# Patient Record
Sex: Male | Born: 1950
Health system: Southern US, Community
[De-identification: ages and names within clinical notes are randomized; demographics above are authoritative.]

## PROBLEM LIST (undated history)

## (undated) DIAGNOSIS — E559 Vitamin D deficiency, unspecified: Secondary | ICD-10-CM

## (undated) DIAGNOSIS — G629 Polyneuropathy, unspecified: Secondary | ICD-10-CM

## (undated) DIAGNOSIS — N529 Male erectile dysfunction, unspecified: Secondary | ICD-10-CM

## (undated) DIAGNOSIS — I350 Nonrheumatic aortic (valve) stenosis: Secondary | ICD-10-CM

## (undated) DIAGNOSIS — R011 Cardiac murmur, unspecified: Secondary | ICD-10-CM

## (undated) DIAGNOSIS — E119 Type 2 diabetes mellitus without complications: Secondary | ICD-10-CM

## (undated) DIAGNOSIS — I10 Essential (primary) hypertension: Secondary | ICD-10-CM

## (undated) DIAGNOSIS — I509 Heart failure, unspecified: Secondary | ICD-10-CM

## (undated) DIAGNOSIS — I6529 Occlusion and stenosis of unspecified carotid artery: Secondary | ICD-10-CM

## (undated) DIAGNOSIS — I739 Peripheral vascular disease, unspecified: Secondary | ICD-10-CM

## (undated) DIAGNOSIS — K219 Gastro-esophageal reflux disease without esophagitis: Secondary | ICD-10-CM

## (undated) DIAGNOSIS — E785 Hyperlipidemia, unspecified: Secondary | ICD-10-CM

## (undated) DIAGNOSIS — D472 Monoclonal gammopathy: Secondary | ICD-10-CM

## (undated) DIAGNOSIS — K859 Acute pancreatitis without necrosis or infection, unspecified: Secondary | ICD-10-CM

## (undated) DIAGNOSIS — E7800 Pure hypercholesterolemia, unspecified: Secondary | ICD-10-CM

## (undated) DIAGNOSIS — E871 Hypo-osmolality and hyponatremia: Secondary | ICD-10-CM

## (undated) HISTORY — PX: KNEE SURGERY: SHX244

## (undated) HISTORY — DX: Hypo-osmolality and hyponatremia: E87.1

## (undated) HISTORY — DX: Occlusion and stenosis of unspecified carotid artery: I65.29

## (undated) HISTORY — DX: Male erectile dysfunction, unspecified: N52.9

## (undated) HISTORY — DX: Cardiac murmur, unspecified: R01.1

## (undated) HISTORY — DX: Acute pancreatitis without necrosis or infection, unspecified: K85.90

## (undated) HISTORY — DX: Polyneuropathy, unspecified: G62.9

## (undated) HISTORY — DX: Hyperlipidemia, unspecified: E78.5

## (undated) HISTORY — PX: VASECTOMY: SHX75

## (undated) HISTORY — PX: COLONOSCOPY: SHX174

## (undated) HISTORY — DX: Vitamin D deficiency, unspecified: E55.9

## (undated) HISTORY — PX: CARDIAC CATHETERIZATION: SHX172

---

## 2004-01-22 ENCOUNTER — Emergency Department (HOSPITAL_COMMUNITY): Admission: EM | Admit: 2004-01-22 | Discharge: 2004-01-22 | Payer: Self-pay | Admitting: Emergency Medicine

## 2004-01-27 ENCOUNTER — Emergency Department (HOSPITAL_COMMUNITY): Admission: EM | Admit: 2004-01-27 | Discharge: 2004-01-27 | Payer: Self-pay | Admitting: Emergency Medicine

## 2004-02-04 ENCOUNTER — Emergency Department (HOSPITAL_COMMUNITY): Admission: EM | Admit: 2004-02-04 | Discharge: 2004-02-04 | Payer: Self-pay | Admitting: Emergency Medicine

## 2005-01-04 ENCOUNTER — Ambulatory Visit: Payer: Self-pay | Admitting: Psychiatry

## 2005-01-04 ENCOUNTER — Other Ambulatory Visit (HOSPITAL_COMMUNITY): Admission: RE | Admit: 2005-01-04 | Discharge: 2005-04-04 | Payer: Self-pay | Admitting: Psychiatry

## 2005-02-22 ENCOUNTER — Emergency Department (HOSPITAL_COMMUNITY): Admission: EM | Admit: 2005-02-22 | Discharge: 2005-02-22 | Payer: Self-pay | Admitting: Emergency Medicine

## 2005-09-30 ENCOUNTER — Encounter: Admission: RE | Admit: 2005-09-30 | Discharge: 2005-12-29 | Payer: Self-pay | Admitting: Internal Medicine

## 2006-05-05 ENCOUNTER — Emergency Department (HOSPITAL_COMMUNITY): Admission: EM | Admit: 2006-05-05 | Discharge: 2006-05-05 | Payer: Self-pay | Admitting: Emergency Medicine

## 2008-02-21 ENCOUNTER — Emergency Department (HOSPITAL_BASED_OUTPATIENT_CLINIC_OR_DEPARTMENT_OTHER): Admission: EM | Admit: 2008-02-21 | Discharge: 2008-02-21 | Payer: Self-pay | Admitting: Emergency Medicine

## 2008-02-21 ENCOUNTER — Ambulatory Visit: Payer: Self-pay | Admitting: Diagnostic Radiology

## 2009-09-16 ENCOUNTER — Ambulatory Visit: Payer: Self-pay | Admitting: Internal Medicine

## 2009-09-16 ENCOUNTER — Inpatient Hospital Stay (HOSPITAL_COMMUNITY): Admission: EM | Admit: 2009-09-16 | Discharge: 2009-09-19 | Payer: Self-pay | Admitting: Emergency Medicine

## 2009-09-18 ENCOUNTER — Encounter (INDEPENDENT_AMBULATORY_CARE_PROVIDER_SITE_OTHER): Payer: Self-pay | Admitting: Internal Medicine

## 2009-10-21 ENCOUNTER — Encounter: Admission: RE | Admit: 2009-10-21 | Discharge: 2009-12-17 | Payer: Self-pay | Admitting: Internal Medicine

## 2010-06-06 LAB — COMPREHENSIVE METABOLIC PANEL
ALT: 31 U/L (ref 0–53)
AST: 34 U/L (ref 0–37)
Albumin: 2.3 g/dL — ABNORMAL LOW (ref 3.5–5.2)
Alkaline Phosphatase: 145 U/L — ABNORMAL HIGH (ref 39–117)
BUN: 11 mg/dL (ref 6–23)
CO2: 28 mEq/L (ref 19–32)
Calcium: 8.4 mg/dL (ref 8.4–10.5)
Chloride: 100 mEq/L (ref 96–112)
Creatinine, Ser: 0.7 mg/dL (ref 0.4–1.5)
GFR calc Af Amer: >60 mL/min (ref >60)
GFR calc non Af Amer: >60 mL/min (ref >60)
Glucose, Bld: 238 mg/dL — ABNORMAL HIGH (ref 70–99)
Potassium: 3.5 mEq/L (ref 3.5–5.1)
Sodium: 134 mEq/L — ABNORMAL LOW (ref 135–145)
Total Bilirubin: 1 mg/dL (ref 0.3–1.2)
Total Protein: 6.3 g/dL (ref 6.0–8.3)

## 2010-06-06 LAB — CBC
HCT: 35.3 % — ABNORMAL LOW (ref 39.0–52.0)
HCT: 36.3 % — ABNORMAL LOW (ref 39.0–52.0)
Hemoglobin: 12.1 g/dL — ABNORMAL LOW (ref 13.0–17.0)
Hemoglobin: 12.6 g/dL — ABNORMAL LOW (ref 13.0–17.0)
MCH: 28.5 pg (ref 26.0–34.0)
MCH: 28.6 pg (ref 26.0–34.0)
MCHC: 34.3 g/dL (ref 30.0–36.0)
MCHC: 34.7 g/dL (ref 30.0–36.0)
MCV: 82.5 fL (ref 78.0–100.0)
MCV: 83 fL (ref 78.0–100.0)
Platelets: 261 10*3/uL (ref 150–400)
Platelets: 291 10*3/uL (ref 150–400)
RBC: 4.25 MIL/uL (ref 4.22–5.81)
RBC: 4.4 MIL/uL (ref 4.22–5.81)
RDW: 14.7 % (ref 11.5–15.5)
RDW: 15.1 % (ref 11.5–15.5)
WBC: 10.1 10*3/uL (ref 4.0–10.5)
WBC: 7.6 10*3/uL (ref 4.0–10.5)

## 2010-06-06 LAB — BASIC METABOLIC PANEL
BUN: 11 mg/dL (ref 6–23)
CO2: 25 mEq/L (ref 19–32)
Calcium: 8.6 mg/dL (ref 8.4–10.5)
Chloride: 101 mEq/L (ref 96–112)
Creatinine, Ser: 0.68 mg/dL (ref 0.4–1.5)
GFR calc Af Amer: >60 mL/min (ref >60)
GFR calc non Af Amer: >60 mL/min (ref >60)
Glucose, Bld: 177 mg/dL — ABNORMAL HIGH (ref 70–99)
Potassium: 3.7 mEq/L (ref 3.5–5.1)
Sodium: 134 mEq/L — ABNORMAL LOW (ref 135–145)

## 2010-06-06 LAB — GLUCOSE, CAPILLARY
Glucose-Capillary: 143 mg/dL — ABNORMAL HIGH (ref 70–99)
Glucose-Capillary: 162 mg/dL — ABNORMAL HIGH (ref 70–99)
Glucose-Capillary: 175 mg/dL — ABNORMAL HIGH (ref 70–99)
Glucose-Capillary: 177 mg/dL — ABNORMAL HIGH (ref 70–99)
Glucose-Capillary: 203 mg/dL — ABNORMAL HIGH (ref 70–99)
Glucose-Capillary: 223 mg/dL — ABNORMAL HIGH (ref 70–99)
Glucose-Capillary: 232 mg/dL — ABNORMAL HIGH (ref 70–99)
Glucose-Capillary: 275 mg/dL — ABNORMAL HIGH (ref 70–99)

## 2010-06-06 LAB — LIPASE, BLOOD: Lipase: 40 U/L (ref 11–59)

## 2010-06-07 LAB — COMPREHENSIVE METABOLIC PANEL
ALT: 21 U/L (ref 0–53)
ALT: 26 U/L (ref 0–53)
AST: 27 U/L (ref 0–37)
AST: 29 U/L (ref 0–37)
Albumin: 2.9 g/dL — ABNORMAL LOW (ref 3.5–5.2)
Albumin: 3.6 g/dL (ref 3.5–5.2)
Alkaline Phosphatase: 139 U/L — ABNORMAL HIGH (ref 39–117)
Alkaline Phosphatase: 167 U/L — ABNORMAL HIGH (ref 39–117)
BUN: 12 mg/dL (ref 6–23)
BUN: 13 mg/dL (ref 6–23)
CO2: 18 mEq/L — ABNORMAL LOW (ref 19–32)
CO2: 22 mEq/L (ref 19–32)
Calcium: 8.5 mg/dL (ref 8.4–10.5)
Calcium: 8.7 mg/dL (ref 8.4–10.5)
Chloride: 97 mEq/L (ref 96–112)
Chloride: 97 mEq/L (ref 96–112)
Creatinine, Ser: 0.77 mg/dL (ref 0.4–1.5)
Creatinine, Ser: 1.09 mg/dL (ref 0.4–1.5)
GFR calc Af Amer: >60 mL/min (ref >60)
GFR calc Af Amer: >60 mL/min (ref >60)
GFR calc non Af Amer: >60 mL/min (ref >60)
GFR calc non Af Amer: >60 mL/min (ref >60)
Glucose, Bld: 254 mg/dL — ABNORMAL HIGH (ref 70–99)
Glucose, Bld: 331 mg/dL — ABNORMAL HIGH (ref 70–99)
Potassium: 4.8 mEq/L (ref 3.5–5.1)
Potassium: 4.9 mEq/L (ref 3.5–5.1)
Sodium: 128 mEq/L — ABNORMAL LOW (ref 135–145)
Sodium: 132 mEq/L — ABNORMAL LOW (ref 135–145)
Total Bilirubin: 1.3 mg/dL — ABNORMAL HIGH (ref 0.3–1.2)
Total Bilirubin: 1.9 mg/dL — ABNORMAL HIGH (ref 0.3–1.2)
Total Protein: 7.1 g/dL (ref 6.0–8.3)
Total Protein: 8.1 g/dL (ref 6.0–8.3)

## 2010-06-07 LAB — URINALYSIS, ROUTINE W REFLEX MICROSCOPIC
Bilirubin Urine: NEGATIVE
Glucose, UA: >1000 mg/dL — AB
Ketones, ur: >80 mg/dL — AB
Leukocytes, UA: NEGATIVE
Nitrite: NEGATIVE
Protein, ur: 100 mg/dL — AB
Specific Gravity, Urine: 1.04 — ABNORMAL HIGH (ref 1.005–1.030)
Urobilinogen, UA: 0.2 mg/dL (ref 0.0–1.0)
pH: 5 (ref 5.0–8.0)

## 2010-06-07 LAB — CBC
HCT: 38.5 % — ABNORMAL LOW (ref 39.0–52.0)
HCT: 39.9 % (ref 39.0–52.0)
Hemoglobin: 13.3 g/dL (ref 13.0–17.0)
Hemoglobin: 13.8 g/dL (ref 13.0–17.0)
MCH: 28.6 pg (ref 26.0–34.0)
MCH: 28.8 pg (ref 26.0–34.0)
MCHC: 34.5 g/dL (ref 30.0–36.0)
MCHC: 34.6 g/dL (ref 30.0–36.0)
MCV: 82.7 fL (ref 78.0–100.0)
MCV: 83.3 fL (ref 78.0–100.0)
Platelets: 317 10*3/uL (ref 150–400)
Platelets: 341 10*3/uL (ref 150–400)
RBC: 4.61 MIL/uL (ref 4.22–5.81)
RBC: 4.82 MIL/uL (ref 4.22–5.81)
RDW: 14.6 % (ref 11.5–15.5)
RDW: 14.9 % (ref 11.5–15.5)
WBC: 16.1 10*3/uL — ABNORMAL HIGH (ref 4.0–10.5)
WBC: 16.3 10*3/uL — ABNORMAL HIGH (ref 4.0–10.5)

## 2010-06-07 LAB — DIFFERENTIAL
Band Neutrophils: 0 % (ref 0–10)
Basophils Absolute: 0 10*3/uL (ref 0.0–0.1)
Basophils Relative: 0 % (ref 0–1)
Blasts: 0 %
Eosinophils Absolute: 0 10*3/uL (ref 0.0–0.7)
Eosinophils Relative: 0 % (ref 0–5)
Lymphocytes Relative: 22 % (ref 12–46)
Lymphs Abs: 3.6 10*3/uL (ref 0.7–4.0)
Metamyelocytes Relative: 0 %
Monocytes Absolute: 0.5 10*3/uL (ref 0.1–1.0)
Monocytes Relative: 3 % (ref 3–12)
Myelocytes: 0 %
Neutro Abs: 12.2 10*3/uL — ABNORMAL HIGH (ref 1.7–7.7)
Neutrophils Relative %: 75 % (ref 43–77)
Promyelocytes Absolute: 0 %
nRBC: 0 /100 WBC

## 2010-06-07 LAB — RAPID URINE DRUG SCREEN, HOSP PERFORMED
Amphetamines: NOT DETECTED
Barbiturates: NOT DETECTED
Benzodiazepines: NOT DETECTED
Cocaine: NOT DETECTED
Opiates: NOT DETECTED
Tetrahydrocannabinol: NOT DETECTED

## 2010-06-07 LAB — LIPID PANEL
Cholesterol: 480 mg/dL — ABNORMAL HIGH (ref 0–200)
HDL: 73 mg/dL (ref >39)
LDL Cholesterol: UNDETERMINED mg/dL (ref 0–99)
Total CHOL/HDL Ratio: 6.6 RATIO
Triglycerides: 1125 mg/dL — ABNORMAL HIGH (ref <150)
VLDL: UNDETERMINED mg/dL (ref 0–40)

## 2010-06-07 LAB — CARDIAC PANEL(CRET KIN+CKTOT+MB+TROPI)
CK, MB: 1.2 ng/mL (ref 0.3–4.0)
CK, MB: 1.3 ng/mL (ref 0.3–4.0)
Relative Index: 1.1 (ref 0.0–2.5)
Relative Index: INVALID (ref 0.0–2.5)
Total CK: 123 U/L (ref 7–232)
Total CK: 95 U/L (ref 7–232)
Troponin I: 0.06 ng/mL (ref 0.00–0.06)
Troponin I: 0.07 ng/mL — ABNORMAL HIGH (ref 0.00–0.06)

## 2010-06-07 LAB — CULTURE, BLOOD (ROUTINE X 2)
Culture: NO GROWTH
Culture: NO GROWTH

## 2010-06-07 LAB — CK TOTAL AND CKMB (NOT AT ARMC)
CK, MB: 1.3 ng/mL (ref 0.3–4.0)
Relative Index: 1.2 (ref 0.0–2.5)
Total CK: 112 U/L (ref 7–232)

## 2010-06-07 LAB — GLUCOSE, CAPILLARY
Glucose-Capillary: 183 mg/dL — ABNORMAL HIGH (ref 70–99)
Glucose-Capillary: 201 mg/dL — ABNORMAL HIGH (ref 70–99)
Glucose-Capillary: 202 mg/dL — ABNORMAL HIGH (ref 70–99)
Glucose-Capillary: 225 mg/dL — ABNORMAL HIGH (ref 70–99)
Glucose-Capillary: 231 mg/dL — ABNORMAL HIGH (ref 70–99)
Glucose-Capillary: 243 mg/dL — ABNORMAL HIGH (ref 70–99)
Glucose-Capillary: 265 mg/dL — ABNORMAL HIGH (ref 70–99)
Glucose-Capillary: 326 mg/dL — ABNORMAL HIGH (ref 70–99)

## 2010-06-07 LAB — URINE MICROSCOPIC-ADD ON

## 2010-06-07 LAB — TROPONIN I: Troponin I: 0.09 ng/mL — ABNORMAL HIGH (ref 0.00–0.06)

## 2010-06-07 LAB — TSH: TSH: 0.762 u[IU]/mL (ref 0.350–4.500)

## 2010-06-07 LAB — PSA: PSA: 0.29 ng/mL (ref 0.10–4.00)

## 2010-06-07 LAB — ETHANOL: Alcohol, Ethyl (B): <5 mg/dL (ref 0–10)

## 2010-06-07 LAB — HEMOGLOBIN A1C
Hgb A1c MFr Bld: 12.6 % — ABNORMAL HIGH (ref <5.7)
Mean Plasma Glucose: 315 mg/dL — ABNORMAL HIGH (ref <117)

## 2010-06-07 LAB — LIPASE, BLOOD: Lipase: 108 U/L — ABNORMAL HIGH (ref 11–59)

## 2010-12-24 LAB — DIFFERENTIAL
Basophils Absolute: 0.1 10*3/uL (ref 0.0–0.1)
Basophils Relative: 1 % (ref 0–1)
Eosinophils Absolute: 0.1 10*3/uL (ref 0.0–0.7)
Eosinophils Relative: 1 % (ref 0–5)
Lymphocytes Relative: 13 % (ref 12–46)
Lymphs Abs: 1.8 10*3/uL (ref 0.7–4.0)
Monocytes Absolute: 0.7 10*3/uL (ref 0.1–1.0)
Monocytes Relative: 5 % (ref 3–12)
Neutro Abs: 11.3 10*3/uL — ABNORMAL HIGH (ref 1.7–7.7)
Neutrophils Relative %: 80 % — ABNORMAL HIGH (ref 43–77)

## 2010-12-24 LAB — CBC
HCT: 42.3 % (ref 39.0–52.0)
Hemoglobin: 15 g/dL (ref 13.0–17.0)
MCHC: 35.5 g/dL (ref 30.0–36.0)
MCV: 81.5 fL (ref 78.0–100.0)
Platelets: 357 10*3/uL (ref 150–400)
RBC: 5.19 MIL/uL (ref 4.22–5.81)
RDW: 13.4 % (ref 11.5–15.5)
WBC: 14 10*3/uL — ABNORMAL HIGH (ref 4.0–10.5)

## 2010-12-24 LAB — URINE MICROSCOPIC-ADD ON

## 2010-12-24 LAB — URINALYSIS, ROUTINE W REFLEX MICROSCOPIC
Bilirubin Urine: NEGATIVE
Glucose, UA: >1000 mg/dL — AB
Ketones, ur: 40 mg/dL — AB
Leukocytes, UA: NEGATIVE
Nitrite: NEGATIVE
Protein, ur: 100 mg/dL — AB
Specific Gravity, Urine: 1.043 — ABNORMAL HIGH (ref 1.005–1.030)
Urobilinogen, UA: 0.2 mg/dL (ref 0.0–1.0)
pH: 6.5 (ref 5.0–8.0)

## 2010-12-24 LAB — POCT CARDIAC MARKERS
CKMB, poc: 3.2 ng/mL (ref 1.0–8.0)
CKMB, poc: <1 ng/mL — ABNORMAL LOW (ref 1.0–8.0)
Myoglobin, poc: 181 ng/mL (ref 12–200)
Myoglobin, poc: 98.2 ng/mL (ref 12–200)
Troponin i, poc: <0.05 ng/mL (ref 0.00–0.09)
Troponin i, poc: <0.05 ng/mL (ref 0.00–0.09)

## 2010-12-24 LAB — COMPREHENSIVE METABOLIC PANEL
ALT: 22 U/L (ref 0–53)
AST: 29 U/L (ref 0–37)
Albumin: 3.5 g/dL (ref 3.5–5.2)
Alkaline Phosphatase: 272 U/L — ABNORMAL HIGH (ref 39–117)
BUN: 13 mg/dL (ref 6–23)
CO2: 20 mEq/L (ref 19–32)
Calcium: 9.4 mg/dL (ref 8.4–10.5)
Chloride: 96 mEq/L (ref 96–112)
Creatinine, Ser: 0.7 mg/dL (ref 0.4–1.5)
GFR calc Af Amer: >60 mL/min (ref >60)
GFR calc non Af Amer: >60 mL/min (ref >60)
Glucose, Bld: 286 mg/dL — ABNORMAL HIGH (ref 70–99)
Potassium: 4.4 mEq/L (ref 3.5–5.1)
Sodium: 133 mEq/L — ABNORMAL LOW (ref 135–145)
Total Bilirubin: 0.9 mg/dL (ref 0.3–1.2)
Total Protein: 10 g/dL — ABNORMAL HIGH (ref 6.0–8.3)

## 2010-12-24 LAB — LIPASE, BLOOD: Lipase: 1437 U/L — ABNORMAL HIGH (ref 23–300)

## 2012-08-12 ENCOUNTER — Inpatient Hospital Stay (HOSPITAL_COMMUNITY)
Admission: EM | Admit: 2012-08-12 | Discharge: 2012-08-16 | DRG: 438 | Disposition: A | Discharge status: Home / Self Care | Payer: MEDICAID | Attending: Family Medicine | Admitting: Family Medicine

## 2012-08-12 ENCOUNTER — Encounter (HOSPITAL_COMMUNITY): Payer: Self-pay | Admitting: *Deleted

## 2012-08-12 ENCOUNTER — Inpatient Hospital Stay (HOSPITAL_COMMUNITY): Payer: Self-pay

## 2012-08-12 ENCOUNTER — Emergency Department (HOSPITAL_COMMUNITY): Payer: Self-pay

## 2012-08-12 DIAGNOSIS — E669 Obesity, unspecified: Secondary | ICD-10-CM | POA: Diagnosis present

## 2012-08-12 DIAGNOSIS — Z87891 Personal history of nicotine dependence: Secondary | ICD-10-CM

## 2012-08-12 DIAGNOSIS — E785 Hyperlipidemia, unspecified: Secondary | ICD-10-CM

## 2012-08-12 DIAGNOSIS — E119 Type 2 diabetes mellitus without complications: Secondary | ICD-10-CM

## 2012-08-12 DIAGNOSIS — E872 Acidosis, unspecified: Secondary | ICD-10-CM | POA: Diagnosis present

## 2012-08-12 DIAGNOSIS — E1310 Other specified diabetes mellitus with ketoacidosis without coma: Secondary | ICD-10-CM | POA: Diagnosis present

## 2012-08-12 DIAGNOSIS — K859 Acute pancreatitis without necrosis or infection, unspecified: Principal | ICD-10-CM | POA: Diagnosis present

## 2012-08-12 DIAGNOSIS — R748 Abnormal levels of other serum enzymes: Secondary | ICD-10-CM | POA: Diagnosis present

## 2012-08-12 DIAGNOSIS — I10 Essential (primary) hypertension: Secondary | ICD-10-CM | POA: Diagnosis present

## 2012-08-12 DIAGNOSIS — E871 Hypo-osmolality and hyponatremia: Secondary | ICD-10-CM | POA: Diagnosis present

## 2012-08-12 HISTORY — DX: Hyperlipidemia, unspecified: E78.5

## 2012-08-12 HISTORY — DX: Essential (primary) hypertension: I10

## 2012-08-12 HISTORY — DX: Pure hypercholesterolemia, unspecified: E78.00

## 2012-08-12 HISTORY — DX: Type 2 diabetes mellitus without complications: E11.9

## 2012-08-12 HISTORY — DX: Acute pancreatitis without necrosis or infection, unspecified: K85.90

## 2012-08-12 LAB — POCT I-STAT TROPONIN I: Troponin i, poc: 0.01 ng/mL (ref 0.00–0.08)

## 2012-08-12 LAB — CBC WITH DIFFERENTIAL/PLATELET
Basophils Absolute: 0 10*3/uL (ref 0.0–0.1)
Basophils Relative: 0 % (ref 0–1)
Eosinophils Absolute: 0 10*3/uL (ref 0.0–0.7)
Eosinophils Relative: 0 % (ref 0–5)
HCT: 41.5 % (ref 39.0–52.0)
Hemoglobin: 15.3 g/dL (ref 13.0–17.0)
Lymphocytes Relative: 10 % — ABNORMAL LOW (ref 12–46)
Lymphs Abs: 1.3 10*3/uL (ref 0.7–4.0)
MCH: 29.1 pg (ref 26.0–34.0)
MCHC: 36.9 g/dL — ABNORMAL HIGH (ref 30.0–36.0)
MCV: 78.9 fL (ref 78.0–100.0)
Monocytes Absolute: 1.7 10*3/uL — ABNORMAL HIGH (ref 0.1–1.0)
Monocytes Relative: 13 % — ABNORMAL HIGH (ref 3–12)
Neutro Abs: 10.2 10*3/uL — ABNORMAL HIGH (ref 1.7–7.7)
Neutrophils Relative %: 77 % (ref 43–77)
Platelets: 286 10*3/uL (ref 150–400)
RBC: 5.26 MIL/uL (ref 4.22–5.81)
RDW: 13 % (ref 11.5–15.5)
WBC: 13.2 10*3/uL — ABNORMAL HIGH (ref 4.0–10.5)

## 2012-08-12 LAB — COMPREHENSIVE METABOLIC PANEL
ALT: 27 U/L (ref 0–53)
AST: 25 U/L (ref 0–37)
Albumin: 4 g/dL (ref 3.5–5.2)
Alkaline Phosphatase: 200 U/L — ABNORMAL HIGH (ref 39–117)
BUN: 14 mg/dL (ref 6–23)
CO2: 17 mEq/L — ABNORMAL LOW (ref 19–32)
Calcium: 9.4 mg/dL (ref 8.4–10.5)
Chloride: 85 mEq/L — ABNORMAL LOW (ref 96–112)
Creatinine, Ser: 0.87 mg/dL (ref 0.50–1.35)
GFR calc Af Amer: >90 mL/min (ref >90)
GFR calc non Af Amer: >90 mL/min (ref >90)
Glucose, Bld: 351 mg/dL — ABNORMAL HIGH (ref 70–99)
Potassium: 5 mEq/L (ref 3.5–5.1)
Sodium: 124 mEq/L — ABNORMAL LOW (ref 135–145)
Total Bilirubin: 0.5 mg/dL (ref 0.3–1.2)
Total Protein: 9 g/dL — ABNORMAL HIGH (ref 6.0–8.3)

## 2012-08-12 LAB — LIPASE, BLOOD: Lipase: 3120 U/L — ABNORMAL HIGH (ref 11–59)

## 2012-08-12 LAB — URINALYSIS, ROUTINE W REFLEX MICROSCOPIC
Bilirubin Urine: NEGATIVE
Glucose, UA: >1000 mg/dL — AB
Hgb urine dipstick: NEGATIVE
Ketones, ur: 40 mg/dL — AB
Leukocytes, UA: NEGATIVE
Nitrite: NEGATIVE
Protein, ur: 30 mg/dL — AB
Specific Gravity, Urine: 1.036 — ABNORMAL HIGH (ref 1.005–1.030)
Urobilinogen, UA: 0.2 mg/dL (ref 0.0–1.0)
pH: 7.5 (ref 5.0–8.0)

## 2012-08-12 LAB — BLOOD GAS, ARTERIAL
Acid-base deficit: 5.9 mmol/L — ABNORMAL HIGH (ref 0.0–2.0)
Bicarbonate: 18.3 mEq/L — ABNORMAL LOW (ref 20.0–24.0)
Drawn by: 31288
FIO2: 0.21 %
O2 Saturation: 91.6 %
Patient temperature: 98.6
TCO2: 16.4 mmol/L (ref 0–100)
pCO2 arterial: 33.9 mmHg — ABNORMAL LOW (ref 35.0–45.0)
pH, Arterial: 7.352 (ref 7.350–7.450)
pO2, Arterial: 68 mmHg — ABNORMAL LOW (ref 80.0–100.0)

## 2012-08-12 LAB — URINE MICROSCOPIC-ADD ON: Urine-Other: NONE SEEN

## 2012-08-12 LAB — GLUCOSE, CAPILLARY
Glucose-Capillary: 248 mg/dL — ABNORMAL HIGH (ref 70–99)
Glucose-Capillary: 285 mg/dL — ABNORMAL HIGH (ref 70–99)

## 2012-08-12 MED ORDER — ACETAMINOPHEN 650 MG RE SUPP: 650.0000 mg | Freq: Four times a day (QID) | RECTAL | Status: DC | PRN

## 2012-08-12 MED ORDER — IOHEXOL 300 MG/ML  SOLN
50.0000 mL | Freq: Once | INTRAMUSCULAR | Status: AC | PRN
  Administered 2012-08-12: 50 mL via ORAL

## 2012-08-12 MED ORDER — SODIUM CHLORIDE 0.9 % IV BOLUS (SEPSIS): 1000.0000 mL | Freq: Once | INTRAVENOUS | Status: DC

## 2012-08-12 MED ORDER — SODIUM CHLORIDE 0.9 % IV BOLUS (SEPSIS)
1000.0000 mL | Freq: Once | INTRAVENOUS | Status: AC
  Administered 2012-08-12: 1000 mL via INTRAVENOUS

## 2012-08-12 MED ORDER — HYDROMORPHONE HCL PF 1 MG/ML IJ SOLN
1.0000 mg | INTRAMUSCULAR | Status: DC | PRN
  Administered 2012-08-13 – 2012-08-16 (×14): 1 mg via INTRAVENOUS
  Filled 2012-08-12 (×14): qty 1

## 2012-08-12 MED ORDER — INSULIN ASPART 100 UNIT/ML ~~LOC~~ SOLN: 0.0000 [IU] | Freq: Three times a day (TID) | SUBCUTANEOUS | Status: DC

## 2012-08-12 MED ORDER — MORPHINE SULFATE 4 MG/ML IJ SOLN
4.0000 mg | Freq: Once | INTRAMUSCULAR | Status: AC
  Administered 2012-08-12: 4 mg via INTRAVENOUS
  Filled 2012-08-12: qty 1

## 2012-08-12 MED ORDER — ONDANSETRON HCL 4 MG PO TABS: 4.0000 mg | ORAL_TABLET | Freq: Four times a day (QID) | ORAL | Status: DC | PRN

## 2012-08-12 MED ORDER — ONDANSETRON HCL 4 MG/2ML IJ SOLN
4.0000 mg | Freq: Four times a day (QID) | INTRAMUSCULAR | Status: DC | PRN
  Administered 2012-08-14: 4 mg via INTRAVENOUS
  Filled 2012-08-12: qty 2

## 2012-08-12 MED ORDER — SODIUM CHLORIDE 0.9 % IJ SOLN
3.0000 mL | Freq: Two times a day (BID) | INTRAMUSCULAR | Status: DC
  Administered 2012-08-13 – 2012-08-14 (×2): 3 mL via INTRAVENOUS

## 2012-08-12 MED ORDER — LABETALOL HCL 5 MG/ML IV SOLN
5.0000 mg | INTRAVENOUS | Status: DC | PRN
  Administered 2012-08-16: 5 mg via INTRAVENOUS
  Filled 2012-08-12: qty 4

## 2012-08-12 MED ORDER — ACETAMINOPHEN 325 MG PO TABS: 650.0000 mg | ORAL_TABLET | Freq: Four times a day (QID) | ORAL | Status: DC | PRN

## 2012-08-12 MED ORDER — PROMETHAZINE HCL 25 MG/ML IJ SOLN
25.0000 mg | Freq: Once | INTRAMUSCULAR | Status: AC
  Administered 2012-08-12: 25 mg via INTRAVENOUS
  Filled 2012-08-12: qty 1

## 2012-08-12 MED ORDER — SODIUM CHLORIDE 0.9 % IV SOLN
INTRAVENOUS | Status: DC
  Administered 2012-08-12 – 2012-08-13 (×2): via INTRAVENOUS
  Administered 2012-08-13: 75 mL/h via INTRAVENOUS
  Administered 2012-08-13: 07:00:00 via INTRAVENOUS

## 2012-08-12 MED ORDER — ONDANSETRON HCL 4 MG/2ML IJ SOLN
4.0000 mg | Freq: Once | INTRAMUSCULAR | Status: AC
  Administered 2012-08-12: 4 mg via INTRAVENOUS
  Filled 2012-08-12: qty 2

## 2012-08-12 MED ORDER — HYDROMORPHONE HCL PF 1 MG/ML IJ SOLN
1.0000 mg | Freq: Once | INTRAMUSCULAR | Status: AC
  Administered 2012-08-12: 1 mg via INTRAVENOUS
  Filled 2012-08-12: qty 1

## 2012-08-12 MED ORDER — IOHEXOL 300 MG/ML  SOLN
100.0000 mL | Freq: Once | INTRAMUSCULAR | Status: AC | PRN
  Administered 2012-08-12: 100 mL via INTRAVENOUS

## 2012-08-12 NOTE — ED Provider Notes (Signed)
History     CSN: [id number]  Arrival date & time 08/12/12  1412   First MD Initiated Contact with Patient 08/12/12 1532      Chief Complaint  Patient presents with  . Abdominal Pain    (Consider location/radiation/quality/duration/timing/severity/associated sxs/prior treatment) HPI Comments: Patient is a 62 year old male with a past medical history of diabetes, pancreatitis, hypertension and HLD who presents with abdominal pain that started last night. The pain is located in his epigastrium and LUQ and does radiate. The pain is described as aching and severe. The pain started gradually and progressively worsened since the onset. No alleviating/aggravating factors. The patient has tried nothing for symptoms without relief. Associated symptoms include nausea. Patient also reports inspiratory chest pain that occurred earlier today for a few minutes which has since resolved. Patient denies fever, headache, vomiting, diarrhea, SOB, dysuria. Patient reports he has been unable to have a decent bowel movement for the past couple days.    Patient is a 62 y.o. male presenting with abdominal pain.  Abdominal Pain Associated symptoms include abdominal pain and nausea.    Past Medical History  Diagnosis Date  . Pancreatitis   . Diabetes mellitus without complication   . Hypertension   . Hypercholesteremia     History reviewed. No pertinent past surgical history.  No family history on file.  History  Substance Use Topics  . Smoking status: Former Smoker    Types: Cigarettes  . Smokeless tobacco: Never Used  . Alcohol Use: No      Review of Systems  Gastrointestinal: Positive for nausea and abdominal pain.  All other systems reviewed and are negative.    Allergies  Review of patient's allergies indicates no known allergies.  Home Medications  No current outpatient prescriptions on file.  BP 126/89  Pulse 95  Temp(Src) 98.3 F (36.8 C)  Resp 20  SpO2 97%  Physical Exam   Nursing note and vitals reviewed. Constitutional: He is oriented to person, place, and time. He appears well-developed and well-nourished. No distress.  HENT:  Head: Normocephalic and atraumatic.  Eyes: Conjunctivae and EOM are normal. Pupils are equal, round, and reactive to light. No scleral icterus.  Neck: Normal range of motion.  Cardiovascular: Normal rate and regular rhythm.  Exam reveals no gallop and no friction rub.   No murmur heard. Pulmonary/Chest: Effort normal and breath sounds normal. He has no wheezes. He has no rales. He exhibits no tenderness.  Abdominal: Soft. He exhibits no distension. There is tenderness. There is no rebound and no guarding.  Epigastric and LUQ tenderness to palpation. No distension or peritoneal signs. No RLQ or RUQ tenderness to palpation.   Musculoskeletal: Normal range of motion.  Neurological: He is alert and oriented to person, place, and time. Coordination normal.  Speech is goal-oriented. Moves limbs without ataxia.   Skin: Skin is warm and dry.  Psychiatric: He has a normal mood and affect. His behavior is normal.    ED Course  Procedures (including critical care time)  Labs Reviewed  CBC WITH DIFFERENTIAL - Abnormal; Notable for the following:    WBC 13.2 (*)    MCHC 36.9 (*)    Lymphocytes Relative 10 (*)    Monocytes Relative 13 (*)    Neutro Abs 10.2 (*)    Monocytes Absolute 1.7 (*)    All other components within normal limits  COMPREHENSIVE METABOLIC PANEL - Abnormal; Notable for the following:    Sodium 124 (*)  Chloride 85 (*)    CO2 17 (*)    Glucose, Bld 351 (*)    Total Protein 9.0 (*)    Alkaline Phosphatase 200 (*)    All other components within normal limits  LIPASE, BLOOD - Abnormal; Notable for the following:    Lipase 3120 (*)    All other components within normal limits  URINALYSIS, ROUTINE W REFLEX MICROSCOPIC - Abnormal; Notable for the following:    Specific Gravity, Urine 1.036 (*)    Glucose, UA >1000  (*)    Ketones, ur 40 (*)    Protein, ur 30 (*)    All other components within normal limits  GLUCOSE, CAPILLARY - Abnormal; Notable for the following:    Glucose-Capillary 285 (*)    All other components within normal limits  URINE CULTURE  URINE MICROSCOPIC-ADD ON  POCT I-STAT TROPONIN I   Dg Abd Acute W/chest  08/12/2012   *RADIOLOGY REPORT*  Clinical Data: Abdominal pain.  Constipation.  Prior history of pancreatitis.  ACUTE ABDOMEN SERIES (ABDOMEN 2 VIEW & CHEST 1 VIEW)  Comparison: CT abdomen pelvis 09/16/2009.  Two-view chest x-ray of that same date.  Acute abdomen series 02/21/2008.  Findings: Bowel gas pattern unremarkable without evidence of obstruction or significant ileus.  No evidence of free air or significant air fluid levels on the erect image.  Moderate stool burden.  Phleboliths low in both sides of the pelvis.  No visible opaque urinary tract calculi.  Degenerative changes involving the lower lumbar spine.  Paget's disease involving the right hemipelvis, unchanged.  Cardiac silhouette normal in size, unchanged.  Thoracic aorta tortuous and mildly atherosclerotic, unchanged.  Hilar and mediastinal contours otherwise unremarkable.  Prominent bronchovascular markings diffusely and moderate central peribronchial thickening, more so than on the prior examinations. No localized airspace consolidation.  No pleural effusions.  IMPRESSION:  1.  No acute abdominal abnormality. 2.  Moderate changes of bronchitis and/or asthma without localized airspace pneumonia. 3.  Paget's disease involving the right hemipelvis.   Original Report Authenticated By: Hulan Saas, M.D.     1. Acute pancreatitis       MDM  3:48 PM Labs and urine pending. Patient will have fluids, morphine and zofran for symptoms. Vitals stable and patient afebrile.   7:27 PM Patient's labs show elevated lipase, hyponatremia and WBC. Patient has pancreatitis. Patient will have dilaudid for pain. Patient will be  admitted for pancreatitis.        Emilia Beck, PA-C 08/12/12 2008

## 2012-08-12 NOTE — ED Provider Notes (Signed)
Medical screening examination/treatment/procedure(s) were performed by non-physician practitioner and as supervising physician I was immediately available for consultation/collaboration.  Khairi Garman T [place], MD 08/12/12 2330 

## 2012-08-12 NOTE — ED Notes (Signed)
Pt states he began having central abdominal pain last night and now pain continues in LUQ.  Pt also c/o left chest pain earlier today that has since resolved.  Pt endorses nausea, but denies vomiting.  Pt has hx of pancreatitis.

## 2012-08-12 NOTE — H&P (Addendum)
[facility] History and Physical  [name] HYQ:[id number] DOB: 1950-12-05 DOA: 08/12/2012  Referring physician: ER physician. PCP: No primary provider on file.  Chief Complaint: Abdominal pain.  HPI: Caleb [name] is a 62 y.o. male with previous history of pancreatitis in 2011 presents with complaints of abdominal pain since last night. The pain is mostly located in the epigastric and left upper quadrant. Radiates to the back associated with nausea but no vomiting or diarrhea. Pain is stabbing in nature. Since the pain was persistent patient presented to the ER and labs reveal elevated lipase and patient has been admitted for acute pancreatitis. Patient's last admission in 2011 patient's pancreatitis was attributed to hypertriglyceridemia. Patient denies drinking alcohol. At this time CT abdomen and pelvis is pending. During the episode of abdominal pain patient had a brief episode of chest pressure which at this time has resolved. Troponins have been negative. EKG is pending.  Review of Systems: As presented in the history of presenting illness, rest negative.  Past Medical History  Diagnosis Date  . Pancreatitis   . Diabetes mellitus without complication   . Hypertension   . Hypercholesteremia    History reviewed. No pertinent past surgical history. Social History:  reports that he has quit smoking. His smoking use included Cigarettes. He smoked 0.00 packs per day. He has never used smokeless tobacco. He reports that he does not drink alcohol. His drug history is not on file. Lives at home. where does patient live-- Can do ADLs. Can patient participate in ADLs?  No Known Allergies  Family History  Problem Relation Age of Onset  . CAD Father   . Hypertension Father       Prior to Admission medications   Not on File   Physical Exam: Filed Vitals:   08/12/12 1421 08/12/12 1934  BP: 126/89 147/97  Pulse: 95 112  Temp: 98.3 F (36.8 C) 98.8 F (37.1 C)  TempSrc:   Oral  Resp: 20 18  SpO2: 97% 94%     General:  Well-developed and nourished.  Eyes: Anicteric no pallor.  ENT: No discharge from the ears eyes nose mouth.  Neck: No mass felt.  Cardiovascular: S1-S2 heard.  Respiratory: No rhonchi or crepitations.  Abdomen: Soft nontender bowel sounds present. No discoloration.  Skin: No rash.  Musculoskeletal: No edema.  Psychiatric: Appears normal.  Neurologic: Alert awake oriented to time place and person. Moves all extremities.  Labs on Admission:  Basic Metabolic Panel:  Recent Labs Lab 08/12/12 1510  NA 124*  K 5.0  CL 85*  CO2 17*  GLUCOSE 351*  BUN 14  CREATININE 0.87  CALCIUM 9.4   Liver Function Tests:  Recent Labs Lab 08/12/12 1510  AST 25  ALT 27  ALKPHOS 200*  BILITOT 0.5  PROT 9.0*  ALBUMIN 4.0    Recent Labs Lab 08/12/12 1510  LIPASE 3120*   No results found for this basename: AMMONIA,  in the last 168 hours CBC:  Recent Labs Lab 08/12/12 1510  WBC 13.2*  NEUTROABS 10.2*  HGB 15.3  HCT 41.5  MCV 78.9  PLT 286   Cardiac Enzymes: No results found for this basename: CKTOTAL, CKMB, CKMBINDEX, TROPONINI,  in the last 168 hours  BNP (last 3 results) No results found for this basename: PROBNP,  in the last 8760 hours CBG:  Recent Labs Lab 08/12/12 1932  GLUCAP 285*    Radiological Exams on Admission: Dg Abd Acute W/chest  08/12/2012   *RADIOLOGY REPORT*  Clinical  Data: Abdominal pain.  Constipation.  Prior history of pancreatitis.  ACUTE ABDOMEN SERIES (ABDOMEN 2 VIEW & CHEST 1 VIEW)  Comparison: CT abdomen pelvis 09/16/2009.  Two-view chest x-ray of that same date.  Acute abdomen series 02/21/2008.  Findings: Bowel gas pattern unremarkable without evidence of obstruction or significant ileus.  No evidence of free air or significant air fluid levels on the erect image.  Moderate stool burden.  Phleboliths low in both sides of the pelvis.  No visible opaque urinary tract calculi.   Degenerative changes involving the lower lumbar spine.  Paget's disease involving the right hemipelvis, unchanged.  Cardiac silhouette normal in size, unchanged.  Thoracic aorta tortuous and mildly atherosclerotic, unchanged.  Hilar and mediastinal contours otherwise unremarkable.  Prominent bronchovascular markings diffusely and moderate central peribronchial thickening, more so than on the prior examinations. No localized airspace consolidation.  No pleural effusions.  IMPRESSION:  1.  No acute abdominal abnormality. 2.  Moderate changes of bronchitis and/or asthma without localized airspace pneumonia. 3.  Paget's disease involving the right hemipelvis.   Original Report Authenticated By: Hulan Saas, M.D.     Assessment/Plan Principal Problem:   Acute pancreatitis Active Problems:   Diabetes mellitus   Hypertension   Hyperlipidemia   Metabolic acidosis   1. Acute pancreatitis - suspect hypertriglyceridemia as the cause. Check lipid panel. CT abdomen and pelvis is pending. Patient will be kept n.p.o. Continue with aggressive IV hydration. 2. Metabolic acidosis - suspect is due to dehydration. I have ordered ABG and acetone and lactic acid levels and a repeat metabolic panel now. Based on which we will consider if patient needs to be on IV insulin infusion. 3. Uncontrolled diabetes mellitus2 - see #2. Check hemoglobin A1c. Patient states that he has been taking metformin as advised. 4. Hyponatremia - probably from dehydration. Hydrate and closely follow metabolic panel. 5. History of hypertension - since patient is n.p.o. I have placed patient on when necessary IV hydralazine for systolic blood pressure was 160 for now. 6. History of hyperlipidemia - check lipid panel.    Code Status: Full code.  Family Communication: Patient's wife at the bedside.  Disposition Plan: Admit to inpatient.    Jola Critzer N. [facility] Pager [phone].  If 7PM-7AM, please contact  night-coverage [email or web address] Password [facility] 08/12/2012, 8:23 PM

## 2012-08-13 LAB — COMPREHENSIVE METABOLIC PANEL
ALT: 19 U/L (ref 0–53)
AST: 20 U/L (ref 0–37)
Albumin: 3.1 g/dL — ABNORMAL LOW (ref 3.5–5.2)
Alkaline Phosphatase: 151 U/L — ABNORMAL HIGH (ref 39–117)
BUN: 10 mg/dL (ref 6–23)
CO2: 15 mEq/L — ABNORMAL LOW (ref 19–32)
Calcium: 8.5 mg/dL (ref 8.4–10.5)
Chloride: 94 mEq/L — ABNORMAL LOW (ref 96–112)
Creatinine, Ser: 0.83 mg/dL (ref 0.50–1.35)
GFR calc Af Amer: >90 mL/min (ref >90)
GFR calc non Af Amer: >90 mL/min (ref >90)
Glucose, Bld: 236 mg/dL — ABNORMAL HIGH (ref 70–99)
Potassium: 4.2 mEq/L (ref 3.5–5.1)
Sodium: 131 mEq/L — ABNORMAL LOW (ref 135–145)
Total Bilirubin: 0.4 mg/dL (ref 0.3–1.2)
Total Protein: 7.7 g/dL (ref 6.0–8.3)

## 2012-08-13 LAB — RAPID URINE DRUG SCREEN, HOSP PERFORMED
Amphetamines: NOT DETECTED
Barbiturates: NOT DETECTED
Benzodiazepines: NOT DETECTED
Cocaine: NOT DETECTED
Opiates: NOT DETECTED
Tetrahydrocannabinol: NOT DETECTED

## 2012-08-13 LAB — GLUCOSE, CAPILLARY
Glucose-Capillary: 153 mg/dL — ABNORMAL HIGH (ref 70–99)
Glucose-Capillary: 156 mg/dL — ABNORMAL HIGH (ref 70–99)
Glucose-Capillary: 193 mg/dL — ABNORMAL HIGH (ref 70–99)
Glucose-Capillary: 195 mg/dL — ABNORMAL HIGH (ref 70–99)
Glucose-Capillary: 198 mg/dL — ABNORMAL HIGH (ref 70–99)
Glucose-Capillary: 204 mg/dL — ABNORMAL HIGH (ref 70–99)
Glucose-Capillary: 206 mg/dL — ABNORMAL HIGH (ref 70–99)
Glucose-Capillary: 206 mg/dL — ABNORMAL HIGH (ref 70–99)
Glucose-Capillary: 207 mg/dL — ABNORMAL HIGH (ref 70–99)
Glucose-Capillary: 209 mg/dL — ABNORMAL HIGH (ref 70–99)

## 2012-08-13 LAB — BASIC METABOLIC PANEL
BUN: 11 mg/dL (ref 6–23)
BUN: 9 mg/dL (ref 6–23)
BUN: 9 mg/dL (ref 6–23)
BUN: 8 mg/dL (ref 6–23)
CO2: 18 mEq/L — ABNORMAL LOW (ref 19–32)
CO2: 16 mEq/L — ABNORMAL LOW (ref 19–32)
CO2: 19 mEq/L (ref 19–32)
CO2: 20 mEq/L (ref 19–32)
Calcium: 8.5 mg/dL (ref 8.4–10.5)
Calcium: 8.9 mg/dL (ref 8.4–10.5)
Calcium: 8.6 mg/dL (ref 8.4–10.5)
Calcium: 8.6 mg/dL (ref 8.4–10.5)
Chloride: 89 mEq/L — ABNORMAL LOW (ref 96–112)
Chloride: 93 mEq/L — ABNORMAL LOW (ref 96–112)
Chloride: 93 mEq/L — ABNORMAL LOW (ref 96–112)
Chloride: 94 mEq/L — ABNORMAL LOW (ref 96–112)
Creatinine, Ser: 0.78 mg/dL (ref 0.50–1.35)
Creatinine, Ser: 0.71 mg/dL (ref 0.50–1.35)
Creatinine, Ser: 0.7 mg/dL (ref 0.50–1.35)
Creatinine, Ser: 0.7 mg/dL (ref 0.50–1.35)
GFR calc Af Amer: >90 mL/min (ref >90)
GFR calc Af Amer: >90 mL/min (ref >90)
GFR calc Af Amer: >90 mL/min (ref >90)
GFR calc Af Amer: >90 mL/min (ref >90)
GFR calc non Af Amer: >90 mL/min (ref >90)
GFR calc non Af Amer: >90 mL/min (ref >90)
GFR calc non Af Amer: >90 mL/min (ref >90)
GFR calc non Af Amer: >90 mL/min (ref >90)
Glucose, Bld: 290 mg/dL — ABNORMAL HIGH (ref 70–99)
Glucose, Bld: 210 mg/dL — ABNORMAL HIGH (ref 70–99)
Glucose, Bld: 222 mg/dL — ABNORMAL HIGH (ref 70–99)
Glucose, Bld: 190 mg/dL — ABNORMAL HIGH (ref 70–99)
Potassium: 4.3 mEq/L (ref 3.5–5.1)
Potassium: 4.6 mEq/L (ref 3.5–5.1)
Potassium: 4.1 mEq/L (ref 3.5–5.1)
Potassium: 3.9 mEq/L (ref 3.5–5.1)
Sodium: 125 mEq/L — ABNORMAL LOW (ref 135–145)
Sodium: 129 mEq/L — ABNORMAL LOW (ref 135–145)
Sodium: 125 mEq/L — ABNORMAL LOW (ref 135–145)
Sodium: 125 mEq/L — ABNORMAL LOW (ref 135–145)

## 2012-08-13 LAB — CBC WITH DIFFERENTIAL/PLATELET
Basophils Absolute: 0 10*3/uL (ref 0.0–0.1)
Basophils Relative: 0 % (ref 0–1)
Eosinophils Absolute: 0 10*3/uL (ref 0.0–0.7)
Eosinophils Relative: 0 % (ref 0–5)
HCT: 38.9 % — ABNORMAL LOW (ref 39.0–52.0)
Hemoglobin: 14.1 g/dL (ref 13.0–17.0)
Lymphocytes Relative: 17 % (ref 12–46)
Lymphs Abs: 2.4 10*3/uL (ref 0.7–4.0)
MCH: 29.6 pg (ref 26.0–34.0)
MCHC: 36.2 g/dL — ABNORMAL HIGH (ref 30.0–36.0)
MCV: 81.7 fL (ref 78.0–100.0)
Monocytes Absolute: 1.1 10*3/uL — ABNORMAL HIGH (ref 0.1–1.0)
Monocytes Relative: 8 % (ref 3–12)
Neutro Abs: 10.8 10*3/uL — ABNORMAL HIGH (ref 1.7–7.7)
Neutrophils Relative %: 75 % (ref 43–77)
Platelets: 309 10*3/uL (ref 150–400)
RBC: 4.76 MIL/uL (ref 4.22–5.81)
RDW: 13.7 % (ref 11.5–15.5)
WBC: 14.3 10*3/uL — ABNORMAL HIGH (ref 4.0–10.5)

## 2012-08-13 LAB — LIPID PANEL
Cholesterol: 721 mg/dL — ABNORMAL HIGH (ref 0–200)
LDL Cholesterol: UNDETERMINED mg/dL (ref 0–99)
Triglycerides: 2786 mg/dL — ABNORMAL HIGH (ref <150)
VLDL: UNDETERMINED mg/dL (ref 0–40)

## 2012-08-13 LAB — LACTIC ACID, PLASMA: Lactic Acid, Venous: 1.5 mmol/L (ref 0.5–2.2)

## 2012-08-13 LAB — LIPASE, BLOOD: Lipase: 1338 U/L — ABNORMAL HIGH (ref 11–59)

## 2012-08-13 LAB — TROPONIN I: Troponin I: <0.3 ng/mL (ref <0.30)

## 2012-08-13 LAB — HEMOGLOBIN A1C
Hgb A1c MFr Bld: 12.6 % — ABNORMAL HIGH (ref <5.7)
Mean Plasma Glucose: 315 mg/dL — ABNORMAL HIGH (ref <117)

## 2012-08-13 LAB — KETONES, QUALITATIVE

## 2012-08-13 MED ORDER — METOPROLOL TARTRATE 50 MG PO TABS
50.0000 mg | ORAL_TABLET | Freq: Two times a day (BID) | ORAL | Status: DC
  Administered 2012-08-13 – 2012-08-15 (×5): 50 mg via ORAL
  Filled 2012-08-13 (×8): qty 1

## 2012-08-13 MED ORDER — POTASSIUM CHLORIDE 10 MEQ/100ML IV SOLN
10.0000 meq | INTRAVENOUS | Status: AC
  Administered 2012-08-13 (×2): 10 meq via INTRAVENOUS
  Filled 2012-08-13 (×2): qty 100

## 2012-08-13 MED ORDER — DEXTROSE-NACL 5-0.45 % IV SOLN
INTRAVENOUS | Status: DC
  Administered 2012-08-13: 100 mL via INTRAVENOUS

## 2012-08-13 MED ORDER — SODIUM CHLORIDE 0.9 % IV SOLN: INTRAVENOUS | Status: DC

## 2012-08-13 MED ORDER — INSULIN ASPART 100 UNIT/ML ~~LOC~~ SOLN: 0.0000 [IU] | Freq: Three times a day (TID) | SUBCUTANEOUS | Status: DC

## 2012-08-13 MED ORDER — GEMFIBROZIL 600 MG PO TABS
600.0000 mg | ORAL_TABLET | Freq: Two times a day (BID) | ORAL | Status: DC
  Filled 2012-08-13 (×2): qty 1

## 2012-08-13 MED ORDER — SODIUM CHLORIDE 0.9 % IV SOLN
INTRAVENOUS | Status: DC
  Filled 2012-08-13: qty 1

## 2012-08-13 MED ORDER — SODIUM CHLORIDE 0.9 % IV BOLUS (SEPSIS)
500.0000 mL | Freq: Once | INTRAVENOUS | Status: AC
  Administered 2012-08-13: 500 mL via INTRAVENOUS

## 2012-08-13 MED ORDER — INSULIN ASPART 100 UNIT/ML ~~LOC~~ SOLN
0.0000 [IU] | SUBCUTANEOUS | Status: DC
  Administered 2012-08-13: 5 [IU] via SUBCUTANEOUS

## 2012-08-13 MED ORDER — SODIUM CHLORIDE 0.9 % IV BOLUS (SEPSIS)
1000.0000 mL | Freq: Once | INTRAVENOUS | Status: AC
  Administered 2012-08-13: 1000 mL via INTRAVENOUS

## 2012-08-13 MED ORDER — INSULIN REGULAR BOLUS VIA INFUSION
0.0000 [IU] | Freq: Three times a day (TID) | INTRAVENOUS | Status: DC
  Filled 2012-08-13: qty 10

## 2012-08-13 MED ORDER — SODIUM CHLORIDE 0.9 % IV SOLN
INTRAVENOUS | Status: DC
  Administered 2012-08-13: 1 [IU]/h via INTRAVENOUS
  Administered 2012-08-13: 5.5 [IU]/h via INTRAVENOUS
  Filled 2012-08-13: qty 1

## 2012-08-13 MED ORDER — ENOXAPARIN SODIUM 40 MG/0.4ML ~~LOC~~ SOLN
40.0000 mg | SUBCUTANEOUS | Status: DC
  Administered 2012-08-13 – 2012-08-15 (×3): 40 mg via SUBCUTANEOUS
  Filled 2012-08-13 (×4): qty 0.4

## 2012-08-13 MED ORDER — ATORVASTATIN CALCIUM 40 MG PO TABS
40.0000 mg | ORAL_TABLET | Freq: Every day | ORAL | Status: DC
  Administered 2012-08-13 – 2012-08-15 (×3): 40 mg via ORAL
  Filled 2012-08-13 (×4): qty 1

## 2012-08-13 MED ORDER — SODIUM BICARBONATE 8.4 % IV SOLN: INTRAVENOUS | Status: DC

## 2012-08-13 MED ORDER — DEXTROSE 50 % IV SOLN: 25.0000 mL | INTRAVENOUS | Status: DC | PRN

## 2012-08-13 MED ORDER — FENOFIBRATE 160 MG PO TABS
160.0000 mg | ORAL_TABLET | Freq: Every day | ORAL | Status: DC
  Administered 2012-08-13 – 2012-08-16 (×4): 160 mg via ORAL
  Filled 2012-08-13 (×4): qty 1

## 2012-08-13 NOTE — Progress Notes (Signed)
[facility] PROGRESS NOTE  Caleb [name] WUJ:[id number] DOB: [birth year] DOA: 08/12/2012 PCP: No primary provider on file.  Brief narrative: Caleb [name] is an 62 y.o. male the past medical history of hypertriglyceridemia and pancreatitis in 2011 who was admitted to the hospital on 08/12/2012 with recurrent acute pancreatitis.  Assessment/Plan: Principal Problem:   Acute pancreatitis -History of significant hypertriglyceridemia in the past which is the likely trigger now. -Continue n.p.o. Status. -Continue supportive care with IV fluids, pain medication, and nausea medication as needed. -Started on empiric Lopid. Will switch this to fenofibrate and add a statin to reduce the risk of myopathy. Active Problems:   Uncontrolled Diabetes mellitus / DKA / Metabolic acidosis -Noted to be acidotic with an anion gap of 22. -Will place on an insulin drip per Glucomander protocol. -Every 2 hour BMETs until stable. -Hemoglobin A1c 12.6%, indicative of poor outpatient control. Diabetes coordinator consultation requested.   Hypertension -Resume metoprolol.   Hyperlipidemia -Severe. Cholesterol 721. Triglycerides 2786. -Dietitian consultation requested.   Code Status: Full. Family Communication: None at bedside. Disposition Plan: Home when stable.   Medical Consultants:  None.  Other Consultants:  None.  Anti-infectives:  None.  HPI/Subjective: Caleb [name] is rating his abdominal pain at about a level 4/10. He is thirsty and has frequent urination but no other complaints. No nausea or vomiting.  Objective: Filed Vitals:   08/13/12 0405 08/13/12 0406 08/13/12 0510 08/13/12 1446  BP:  150/73 149/73 148/98  Pulse:   115 120  Temp: 98.3 F (36.8 C)  98.3 F (36.8 C) 99.5 F (37.5 C)  TempSrc:   Oral Oral  Resp:   18 18  Height:      Weight:   97.8 kg (215 lb 9.8 oz)   SpO2:   98% 92%    Intake/Output Summary (Last 24 hours) at 08/13/12 1623 Last data filed at  08/13/12 1446  Gross per 24 hour  Intake 2867.5 ml  Output   2251 ml  Net  616.5 ml    Exam: Gen:  NAD Cardiovascular:  RRR, No M/R/G Respiratory:  Lungs CTAB Gastrointestinal:  Abdomen soft, mildly tender midepigastric area, + BS Extremities:  No C/E/C  Data Reviewed: Basic Metabolic Panel:  Recent Labs Lab 08/12/12 1510 08/12/12 2235 08/13/12 0433  NA 124* 125* 131*  K 5.0 4.3 4.2  CL 85* 89* 94*  CO2 17* 18* 15*  GLUCOSE 351* 290* 236*  BUN 14 11 10   CREATININE 0.87 0.78 0.83  CALCIUM 9.4 8.5 8.5   GFR Estimated Creatinine Clearance: 102.9 ml/min (by C-G formula based on Cr of 0.83). Liver Function Tests:  Recent Labs Lab 08/12/12 1510 08/13/12 0433  AST 25 20  ALT 27 19  ALKPHOS 200* 151*  BILITOT 0.5 0.4  PROT 9.0* 7.7  ALBUMIN 4.0 3.1*    Recent Labs Lab 08/12/12 1510 08/13/12 0433  LIPASE 3120* 1338*   CBC:  Recent Labs Lab 08/12/12 1510 08/13/12 0433  WBC 13.2* 14.3*  NEUTROABS 10.2* 10.8*  HGB 15.3 14.1  HCT 41.5 38.9*  MCV 78.9 81.7  PLT 286 309   Cardiac Enzymes:  Recent Labs Lab 08/12/12 2235  TROPONINI <0.30   CBG:  Recent Labs Lab 08/12/12 1932 08/12/12 2350 08/13/12 0404 08/13/12 0713 08/13/12 1237  GLUCAP 285* 248* 206* 204* 206*   Hgb A1c  Recent Labs  08/13/12 0433  HGBA1C 12.6*   Lipid Profile  Recent Labs  08/13/12 0435  CHOL 721*  HDL NOT REPORTED DUE TO HIGH  TRIGLYCERIDES  LDLCALC UNABLE TO CALCULATE IF TRIGLYCERIDE OVER 400 mg/dL  TRIG 7846*  CHOLHDL NOT REPORTED DUE TO HIGH TRIGLYCERIDES   Microbiology No results found for this or any previous visit (from the past 240 hour(s)).   Procedures and Diagnostic Studies: Ct Abdomen Pelvis W Contrast  08/12/2012   *RADIOLOGY REPORT*  Clinical Data: Central abdominal pain, radiating to the left upper quadrant.  Nausea.  CT ABDOMEN AND PELVIS WITH CONTRAST  Technique:  Multidetector CT imaging of the abdomen and pelvis was performed following the  standard protocol during bolus administration of intravenous contrast.  Contrast: 100 mL of Omnipaque 300 IV contrast  Comparison: CT of the abdomen and pelvis performed 09/16/2009, and abdominal ultrasound performed 09/17/2009  Findings: Minimal bibasilar atelectasis is noted.  Scattered coronary artery calcifications are seen.  Calcification is noted at the aortic valve.  There is diffuse soft tissue inflammation about the pancreatic body and tail, compatible with acute pancreatitis.  Associated trace fluid is seen tracking inferiorly along Gerota's fascia on the left side.  There appears to be a 1.9 cm pseudocyst along the body of the pancreas.  No definite masses are seen.  The pancreatic head is grossly unremarkable in appearance.  There is no definite evidence of devascularization.  The liver and spleen are unremarkable in appearance.  The gallbladder is within normal limits.  The adrenal glands are unremarkable.  The kidneys are unremarkable in appearance.  There is no evidence of hydronephrosis.  No renal or ureteral stones are seen. Nonspecific perinephric stranding is noted bilaterally.  No free fluid is identified.  The small bowel is unremarkable in appearance.  The stomach is within normal limits.  No acute vascular abnormalities are seen.  Scattered calcification is noted along the abdominal aorta and its branches.  The appendix is normal in caliber, without evidence for appendicitis.  Mild soft tissue inflammation is noted about the splenic flexure of colon, reflecting the adjacent pancreatic process.  The colon is otherwise unremarkable in appearance.  The bladder is moderately distended and grossly unremarkable in appearance.  The prostate remains normal in size.  No inguinal lymphadenopathy is seen.  No acute osseous abnormalities are identified.  There is diffuse sclerosis and heterogeneity involving the right ilium and ischium, extending to the upper sacrum and the left side of the pubic  symphysis.  This likely reflects Paget's disease. Likely associated vague heterogeneity is noted within the visualized vertebral bodies.  Facet disease is noted at the lower lumbar spine.  IMPRESSION:  1.  Acute pancreatitis noted, with diffuse soft tissue inflammation about the pancreatic body and tail, and trace fluid tracking inferiorly along Gerota's fascia on the left side.  1.9 cm pseudocyst noted along the body of the pancreas; no evidence of devascularization at this time. 2. Scattered coronary artery calcifications seen. 3.  Calcification of the aortic valve. 4.  Changes of Paget's disease noted at the pelvis and likely along the lumbar spine. 5.  Scattered calcification along the abdominal aorta and its branches.   Original Report Authenticated By: Tonia Ghent, M.D.   Dg Abd Acute W/chest  08/12/2012   *RADIOLOGY REPORT*  Clinical Data: Abdominal pain.  Constipation.  Prior history of pancreatitis.  ACUTE ABDOMEN SERIES (ABDOMEN 2 VIEW & CHEST 1 VIEW)  Comparison: CT abdomen pelvis 09/16/2009.  Two-view chest x-ray of that same date.  Acute abdomen series 02/21/2008.  Findings: Bowel gas pattern unremarkable without evidence of obstruction or significant ileus.  No evidence of free air  or significant air fluid levels on the erect image.  Moderate stool burden.  Phleboliths low in both sides of the pelvis.  No visible opaque urinary tract calculi.  Degenerative changes involving the lower lumbar spine.  Paget's disease involving the right hemipelvis, unchanged.  Cardiac silhouette normal in size, unchanged.  Thoracic aorta tortuous and mildly atherosclerotic, unchanged.  Hilar and mediastinal contours otherwise unremarkable.  Prominent bronchovascular markings diffusely and moderate central peribronchial thickening, more so than on the prior examinations. No localized airspace consolidation.  No pleural effusions.  IMPRESSION:  1.  No acute abdominal abnormality. 2.  Moderate changes of bronchitis  and/or asthma without localized airspace pneumonia. 3.  Paget's disease involving the right hemipelvis.   Original Report Authenticated By: Hulan Saas, M.D.    Scheduled Meds: . gemfibrozil  600 mg Oral BID AC  . insulin aspart  0-15 Units Subcutaneous Q4H  . sodium chloride  1,000 mL Intravenous Once  . sodium chloride  3 mL Intravenous Q12H   Continuous Infusions: . sodium chloride 75 mL/hr (08/13/12 1308)    Time spent: 35 minutes with greater than 50% of time spent counseling the patient on the disease process of pancreatitis, contribution of hyperlipidemia to his disease, and current plan of care.   LOS: 1 day   Caleb [name]  [facility] Pager [phone].  If 8PM-8AM, please contact night-coverage at [email or web address], password [facility] 08/13/2012, 4:23 PM

## 2012-08-13 NOTE — Progress Notes (Signed)
Patient's HR has been about 115-120 at rest since arriving to unit around 2300 and spikes to about 140 when up to the bathroom. MD made aware and patient received two 500 cc boluses and one 1 L bolus throughout the night. HR still remains around 115-120. Will continue to monitor. Caleb [name]

## 2012-08-13 NOTE — Plan of Care (Signed)
Problem: Phase I Progression Outcomes Goal: Initial discharge plan identified Outcome: Completed/Met Date Met:  08/13/12 home

## 2012-08-13 NOTE — Progress Notes (Signed)
Unable to get IV site for K+ runs after 2 RNs tried( 1 floor RN and 1 IVT RN ). IVT will send anothern RN

## 2012-08-14 DIAGNOSIS — E871 Hypo-osmolality and hyponatremia: Secondary | ICD-10-CM | POA: Diagnosis present

## 2012-08-14 DIAGNOSIS — E669 Obesity, unspecified: Secondary | ICD-10-CM | POA: Diagnosis present

## 2012-08-14 HISTORY — DX: Hypo-osmolality and hyponatremia: E87.1

## 2012-08-14 LAB — GLUCOSE, CAPILLARY
Glucose-Capillary: 127 mg/dL — ABNORMAL HIGH (ref 70–99)
Glucose-Capillary: 151 mg/dL — ABNORMAL HIGH (ref 70–99)
Glucose-Capillary: 173 mg/dL — ABNORMAL HIGH (ref 70–99)
Glucose-Capillary: 201 mg/dL — ABNORMAL HIGH (ref 70–99)
Glucose-Capillary: 216 mg/dL — ABNORMAL HIGH (ref 70–99)
Glucose-Capillary: 219 mg/dL — ABNORMAL HIGH (ref 70–99)
Glucose-Capillary: 257 mg/dL — ABNORMAL HIGH (ref 70–99)

## 2012-08-14 LAB — BASIC METABOLIC PANEL
BUN: 9 mg/dL (ref 6–23)
CO2: 20 mEq/L (ref 19–32)
Calcium: 8.8 mg/dL (ref 8.4–10.5)
Chloride: 92 mEq/L — ABNORMAL LOW (ref 96–112)
Creatinine, Ser: 0.75 mg/dL (ref 0.50–1.35)
GFR calc Af Amer: >90 mL/min (ref >90)
GFR calc non Af Amer: >90 mL/min (ref >90)
Glucose, Bld: 201 mg/dL — ABNORMAL HIGH (ref 70–99)
Potassium: 4.2 mEq/L (ref 3.5–5.1)
Sodium: 125 mEq/L — ABNORMAL LOW (ref 135–145)

## 2012-08-14 LAB — LIPASE, BLOOD: Lipase: 426 U/L — ABNORMAL HIGH (ref 11–59)

## 2012-08-14 MED ORDER — SODIUM CHLORIDE 0.9 % IV SOLN
INTRAVENOUS | Status: DC
  Administered 2012-08-14 (×2): 100 mL/h via INTRAVENOUS
  Administered 2012-08-15 (×2): via INTRAVENOUS

## 2012-08-14 MED ORDER — BISACODYL 10 MG RE SUPP: 10.0000 mg | Freq: Every day | RECTAL | Status: DC | PRN

## 2012-08-14 MED ORDER — INSULIN ASPART 100 UNIT/ML ~~LOC~~ SOLN: 0.0000 [IU] | SUBCUTANEOUS | Status: DC

## 2012-08-14 MED ORDER — PNEUMOCOCCAL VAC POLYVALENT 25 MCG/0.5ML IJ INJ
0.5000 mL | INJECTION | INTRAMUSCULAR | Status: AC
  Administered 2012-08-15: 0.5 mL via INTRAMUSCULAR
  Filled 2012-08-14 (×2): qty 0.5

## 2012-08-14 MED ORDER — INSULIN ASPART 100 UNIT/ML ~~LOC~~ SOLN
0.0000 [IU] | SUBCUTANEOUS | Status: DC
  Administered 2012-08-14: 3 [IU] via SUBCUTANEOUS
  Administered 2012-08-14: 2 [IU] via SUBCUTANEOUS
  Administered 2012-08-14: 3 [IU] via SUBCUTANEOUS
  Administered 2012-08-14: 21:00:00 via SUBCUTANEOUS
  Administered 2012-08-14: 3 [IU] via SUBCUTANEOUS
  Administered 2012-08-15: 5 [IU] via SUBCUTANEOUS
  Administered 2012-08-15: 3 [IU] via SUBCUTANEOUS
  Administered 2012-08-15: 2 [IU] via SUBCUTANEOUS
  Administered 2012-08-15: 1 [IU] via SUBCUTANEOUS
  Administered 2012-08-15: 5 [IU] via SUBCUTANEOUS
  Administered 2012-08-15 – 2012-08-16 (×3): 2 [IU] via SUBCUTANEOUS

## 2012-08-14 MED ORDER — INSULIN DETEMIR 100 UNIT/ML ~~LOC~~ SOLN
10.0000 [IU] | Freq: Two times a day (BID) | SUBCUTANEOUS | Status: DC
  Administered 2012-08-14 – 2012-08-15 (×4): 10 [IU] via SUBCUTANEOUS
  Filled 2012-08-14 (×6): qty 0.1

## 2012-08-14 MED ORDER — LIVING WELL WITH DIABETES BOOK
Freq: Once | Status: DC
  Filled 2012-08-14 (×2): qty 1

## 2012-08-14 MED ORDER — INSULIN ASPART 100 UNIT/ML ~~LOC~~ SOLN
3.0000 [IU] | Freq: Once | SUBCUTANEOUS | Status: AC
  Administered 2012-08-14: 3 [IU] via SUBCUTANEOUS

## 2012-08-14 NOTE — Progress Notes (Signed)
Nutrition Education Note  RD consulted for nutrition education regarding a diet education to help lower triglycerides and cholesterol.  Lipid Panel     Component Value Date/Time   CHOL 721* 08/13/2012 0435   TRIG 2786* 08/13/2012 0435   HDL NOT REPORTED DUE TO HIGH TRIGLYCERIDES 08/13/2012 0435   CHOLHDL NOT REPORTED DUE TO HIGH TRIGLYCERIDES 08/13/2012 0435   VLDL UNABLE TO CALCULATE IF TRIGLYCERIDE OVER 400 mg/dL 1/61/0960 4540   LDLCALC UNABLE TO CALCULATE IF TRIGLYCERIDE OVER 400 mg/dL 9/81/1914 7829    RD provided "High cholesterol (TLC Diet) Nutrition Therapy" handout from the Academy of [employer]etics. Reviewed patient's dietary recall. Provided examples on ways to decrease sodium and fat intake in diet. Discouraged intake of processed foods, whole fat dairy, and biscuits/croissants. Encouraged fresh fruits and vegetables as well as whole grain sources of carbohydrates to maximize fiber intake. Recommend increasing intake of omega-3's by taking a supplement and/or eating more baked fish. Pt plans on eating more fruits, less processed and red meat, fewer biscuits and croissants, more whole wheat products, and exercising more often. Pt states he has a daughter who is vegetarian and cooks very well, family members that exercise regularly, and a supportive wife who cooks healthy food. Pt recently started drinking Almond milk instead of regular milk; praised pt for this change. Teach back method used.  Expect good compliance.  Body mass index is 33.76 kg/(m^2). Pt meets criteria for Obese based on current BMI.  Current diet order is NPO, pt reports feeling hungry and wants to eat some jello. Pt reports eating well PTA; pt ate chili and a hot dog Saturday night after which he started feeling poorly. Labs and medications reviewed. No further nutrition interventions warranted at this time. RD contact information provided. If additional nutrition issues arise, please re-consult RD.  Ian Malkin RD, LDN Inpatient Clinical Dietitian Pager: [phone] After Hours Pager: [phone]

## 2012-08-14 NOTE — Progress Notes (Signed)
[facility] PROGRESS NOTE  Caleb [name] MVH:[id number] DOB: 1951/03/21 DOA: 08/12/2012 PCP: No primary provider on file.  Brief narrative: Caleb [name] is an 62 y.o. male with a past medical history of hypertriglyceridemia and pancreatitis in 2011 who was admitted to the hospital on 08/12/2012 with recurrent acute pancreatitis.  Assessment/Plan: Principal Problem:   Acute pancreatitis -History of significant hypertriglyceridemia, confirmed on repeat FLP. -Start clear liquids. -Continue supportive care with IV fluids, pain medication, and nausea medication as needed. -Continue fenofibrate and statin. -Lipase levels coming down. Active Problems:   Hyponatremia -May be from hyperglycemia or 3rd spacing.  Continue to hydrate, correct hyperglycemia.   Uncontrolled Diabetes mellitus / DKA / Metabolic acidosis -Noted to be acidotic with an anion gap of 22. -Initially placed on an insulin drip per Glucomander DKA protocol.  Bicarb normalized, gap closed. -Now on SSI Q 4 hours.  Add Levemir. -Hemoglobin A1c 12.6%, indicative of poor outpatient control. Diabetes coordinator consultation performed 08/14/12.  Will likely need to go home on insulin therapy.   Hypertension -Continue metoprolol.   Hyperlipidemia / Obesity -Severe. Cholesterol 721. Triglycerides 2786. -Dietitian consultation performed 08/14/12.   Code Status: Full. Family Communication: None at bedside. Disposition Plan: Home when stable.   Medical Consultants:  None.  Other Consultants:  Dietician  Diabetes coordinator  Anti-infectives:  None.  HPI/Subjective: Caleb [name] is still having some intermittent abdominal discomfort. No nausea or vomiting.  No dyspnea.  Objective: Filed Vitals:   08/13/12 1446 08/13/12 2129 08/14/12 0442 08/14/12 1330  BP: 148/98 148/90 167/97 129/77  Pulse: 120 120 113 108  Temp: 99.5 F (37.5 C) 98.7 F (37.1 C) 99.3 F (37.4 C) 98.9 F (37.2 C)  TempSrc: Oral Oral  Oral Oral  Resp: 18 19 19 17   Height:      Weight:      SpO2: 92% 94% 96% 98%    Intake/Output Summary (Last 24 hours) at 08/14/12 1432 Last data filed at 08/14/12 0748  Gross per 24 hour  Intake   1330 ml  Output   1550 ml  Net   -220 ml    Exam: Gen:  NAD Cardiovascular:  RRR, No M/R/G Respiratory:  Lungs CTAB Gastrointestinal:  Abdomen soft, mildly tender midepigastric area, + BS Extremities:  No C/E/C  Data Reviewed: Basic Metabolic Panel:  Recent Labs Lab 08/13/12 0433 08/13/12 1722 08/13/12 1919 08/13/12 2200 08/14/12 0421  NA 131* 129* 125* 125* 125*  K 4.2 4.6 4.1 3.9 4.2  CL 94* 93* 93* 94* 92*  CO2 15* 16* 19 20 20   GLUCOSE 236* 210* 222* 190* 201*  BUN 10 9 9 8 9   CREATININE 0.83 0.71 0.70 0.70 0.75  CALCIUM 8.5 8.9 8.6 8.6 8.8   GFR Estimated Creatinine Clearance: 106.7 ml/min (by C-G formula based on Cr of 0.75). Liver Function Tests:  Recent Labs Lab 08/12/12 1510 08/13/12 0433  AST 25 20  ALT 27 19  ALKPHOS 200* 151*  BILITOT 0.5 0.4  PROT 9.0* 7.7  ALBUMIN 4.0 3.1*    Recent Labs Lab 08/12/12 1510 08/13/12 0433 08/14/12 0421  LIPASE 3120* 1338* 426*   CBC:  Recent Labs Lab 08/12/12 1510 08/13/12 0433  WBC 13.2* 14.3*  NEUTROABS 10.2* 10.8*  HGB 15.3 14.1  HCT 41.5 38.9*  MCV 78.9 81.7  PLT 286 309   Cardiac Enzymes:  Recent Labs Lab 08/12/12 2235  TROPONINI <0.30   CBG:  Recent Labs Lab 08/14/12 0138 08/14/12 0447 08/14/12 8413 08/14/12 0834 08/14/12 1209  GLUCAP 127* 216* 257* 219* 201*   Hgb A1c  Recent Labs  08/13/12 0433  HGBA1C 12.6*   Lipid Profile  Recent Labs  08/13/12 0435  CHOL 721*  HDL NOT REPORTED DUE TO HIGH TRIGLYCERIDES  LDLCALC UNABLE TO CALCULATE IF TRIGLYCERIDE OVER 400 mg/dL  TRIG 1610*  CHOLHDL NOT REPORTED DUE TO HIGH TRIGLYCERIDES   Microbiology Recent Results (from the past 240 hour(s))  URINE CULTURE     Status: None   Collection Time    08/12/12  4:13 PM       Result Value Range Status   Specimen Description URINE, CLEAN CATCH   Final   Special Requests Normal   Final   Culture  Setup Time 08/13/2012 09:11   Final   Colony Count 30,000 COLONIES/ML   Final   Culture GRAM NEGATIVE RODS   Final   Report Status PENDING   Incomplete     Procedures and Diagnostic Studies: Ct Abdomen Pelvis W Contrast  08/12/2012   *RADIOLOGY REPORT*  Clinical Data: Central abdominal pain, radiating to the left upper quadrant.  Nausea.  CT ABDOMEN AND PELVIS WITH CONTRAST  Technique:  Multidetector CT imaging of the abdomen and pelvis was performed following the standard protocol during bolus administration of intravenous contrast.  Contrast: 100 mL of Omnipaque 300 IV contrast  Comparison: CT of the abdomen and pelvis performed 09/16/2009, and abdominal ultrasound performed 09/17/2009  Findings: Minimal bibasilar atelectasis is noted.  Scattered coronary artery calcifications are seen.  Calcification is noted at the aortic valve.  There is diffuse soft tissue inflammation about the pancreatic body and tail, compatible with acute pancreatitis.  Associated trace fluid is seen tracking inferiorly along Gerota's fascia on the left side.  There appears to be a 1.9 cm pseudocyst along the body of the pancreas.  No definite masses are seen.  The pancreatic head is grossly unremarkable in appearance.  There is no definite evidence of devascularization.  The liver and spleen are unremarkable in appearance.  The gallbladder is within normal limits.  The adrenal glands are unremarkable.  The kidneys are unremarkable in appearance.  There is no evidence of hydronephrosis.  No renal or ureteral stones are seen. Nonspecific perinephric stranding is noted bilaterally.  No free fluid is identified.  The small bowel is unremarkable in appearance.  The stomach is within normal limits.  No acute vascular abnormalities are seen.  Scattered calcification is noted along the abdominal aorta and its  branches.  The appendix is normal in caliber, without evidence for appendicitis.  Mild soft tissue inflammation is noted about the splenic flexure of colon, reflecting the adjacent pancreatic process.  The colon is otherwise unremarkable in appearance.  The bladder is moderately distended and grossly unremarkable in appearance.  The prostate remains normal in size.  No inguinal lymphadenopathy is seen.  No acute osseous abnormalities are identified.  There is diffuse sclerosis and heterogeneity involving the right ilium and ischium, extending to the upper sacrum and the left side of the pubic symphysis.  This likely reflects Paget's disease. Likely associated vague heterogeneity is noted within the visualized vertebral bodies.  Facet disease is noted at the lower lumbar spine.  IMPRESSION:  1.  Acute pancreatitis noted, with diffuse soft tissue inflammation about the pancreatic body and tail, and trace fluid tracking inferiorly along Gerota's fascia on the left side.  1.9 cm pseudocyst noted along the body of the pancreas; no evidence of devascularization at this time. 2. Scattered coronary artery  calcifications seen. 3.  Calcification of the aortic valve. 4.  Changes of Paget's disease noted at the pelvis and likely along the lumbar spine. 5.  Scattered calcification along the abdominal aorta and its branches.   Original Report Authenticated By: Tonia Ghent, M.D.   Dg Abd Acute W/chest  08/12/2012   *RADIOLOGY REPORT*  Clinical Data: Abdominal pain.  Constipation.  Prior history of pancreatitis.  ACUTE ABDOMEN SERIES (ABDOMEN 2 VIEW & CHEST 1 VIEW)  Comparison: CT abdomen pelvis 09/16/2009.  Two-view chest x-ray of that same date.  Acute abdomen series 02/21/2008.  Findings: Bowel gas pattern unremarkable without evidence of obstruction or significant ileus.  No evidence of free air or significant air fluid levels on the erect image.  Moderate stool burden.  Phleboliths low in both sides of the pelvis.  No  visible opaque urinary tract calculi.  Degenerative changes involving the lower lumbar spine.  Paget's disease involving the right hemipelvis, unchanged.  Cardiac silhouette normal in size, unchanged.  Thoracic aorta tortuous and mildly atherosclerotic, unchanged.  Hilar and mediastinal contours otherwise unremarkable.  Prominent bronchovascular markings diffusely and moderate central peribronchial thickening, more so than on the prior examinations. No localized airspace consolidation.  No pleural effusions.  IMPRESSION:  1.  No acute abdominal abnormality. 2.  Moderate changes of bronchitis and/or asthma without localized airspace pneumonia. 3.  Paget's disease involving the right hemipelvis.   Original Report Authenticated By: Hulan Saas, M.D.    Scheduled Meds: . atorvastatin  40 mg Oral q1800  . enoxaparin (LOVENOX) injection  40 mg Subcutaneous Q24H  . fenofibrate  160 mg Oral Daily  . insulin aspart  0-9 Units Subcutaneous Q4H  . living well with diabetes book   Does not apply Once  . metoprolol  50 mg Oral BID  . [START ON 08/15/2012] pneumococcal 23 valent vaccine  0.5 mL Intramuscular Tomorrow-1000  . sodium chloride  3 mL Intravenous Q12H   Continuous Infusions: . sodium chloride    . sodium chloride      Time spent: 25 minutes  LOS: 2 days   RAMA,CHRISTINA  [facility] Pager [phone].  If 8PM-8AM, please contact night-coverage at [email or web address], password [facility] 08/14/2012, 2:32 PM

## 2012-08-14 NOTE — Progress Notes (Signed)
Inpatient Diabetes Program Recommendations  AACE/ADA: New Consensus Statement on Inpatient Glycemic Control (2013)  Target Ranges:  Prepandial:   less than 140 mg/dL      Peak postprandial:   less than 180 mg/dL (1-2 hours)      Critically ill patients:  140 - 180 mg/dL   Reason for Visit: R6E=45.4% indicating poor control of diabetes.  There are no diabetes medications listed on PTA meds, however patient states he was taking metformin.  A1C indicates that patient will need insulin at discharge.  Consider adding Levemir 20 units q HS.  Will talk with patient.     Note:  42 Spoke with patient.  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  He would like to avoid insulin if possible due to his job.  He lacks prescription drug coverage and therefore cost of medicines are a variable with compliance.  He has put in the paperwork to go to the [place] and plans to complete this process after discharge.  He says he is motivated to get his blood sugars down.  Explained that there are long term consequences to hyperglycemia.  He goes to a gym but has not been exercising regularly.  Will order dietician consult.  Patient will definitely need to add medications.  May be candidate for new SGLT2 inhibitor such as Hendricks Limes which has a coupon for 10$ per month for cash paying patients.  Would also continue metformin.  Will discuss with MD.  Explained importance of close follow-up.

## 2012-08-15 DIAGNOSIS — E871 Hypo-osmolality and hyponatremia: Secondary | ICD-10-CM

## 2012-08-15 LAB — BASIC METABOLIC PANEL
BUN: 13 mg/dL (ref 6–23)
CO2: 25 mEq/L (ref 19–32)
Calcium: 9.1 mg/dL (ref 8.4–10.5)
Chloride: 98 mEq/L (ref 96–112)
Creatinine, Ser: 0.78 mg/dL (ref 0.50–1.35)
GFR calc Af Amer: >90 mL/min (ref >90)
GFR calc non Af Amer: >90 mL/min (ref >90)
Glucose, Bld: 156 mg/dL — ABNORMAL HIGH (ref 70–99)
Potassium: 3.7 mEq/L (ref 3.5–5.1)
Sodium: 132 mEq/L — ABNORMAL LOW (ref 135–145)

## 2012-08-15 LAB — URINE CULTURE
Colony Count: 30000
Special Requests: NORMAL

## 2012-08-15 LAB — GLUCOSE, CAPILLARY
Glucose-Capillary: 148 mg/dL — ABNORMAL HIGH (ref 70–99)
Glucose-Capillary: 177 mg/dL — ABNORMAL HIGH (ref 70–99)
Glucose-Capillary: 194 mg/dL — ABNORMAL HIGH (ref 70–99)
Glucose-Capillary: 210 mg/dL — ABNORMAL HIGH (ref 70–99)
Glucose-Capillary: 280 mg/dL — ABNORMAL HIGH (ref 70–99)
Glucose-Capillary: 283 mg/dL — ABNORMAL HIGH (ref 70–99)
Glucose-Capillary: 336 mg/dL — ABNORMAL HIGH (ref 70–99)

## 2012-08-15 NOTE — Progress Notes (Signed)
[facility] PROGRESS NOTE  Caleb [name] ZOX:[id number] DOB: 27-Nov-1950 DOA: 08/12/2012 PCP: No primary provider on file.  Assessment/Plan:  Acute pancreatitis  -History of significant hypertriglyceridemia, confirmed on repeat FLP.  -Tolerated clears well.  Will advance diet to low fat diet. -Continue supportive care with IV fluids, pain medication, and nausea medication as needed.  -Continue fenofibrate and statin.  -Lipase levels coming down.  Will recheck level next am.   Hyponatremia  -Is improving. Hyperglycemia contributing. - Improved with IVF's.  Will cut down IVF's given improvement in po intake.  Uncontrolled Diabetes mellitus / DKA / Metabolic acidosis  -Noted to be acidotic with an anion gap of 22.  -Initially placed on an insulin drip per Glucomander DKA protocol. Bicarb normalized, gap closed.  -Now on SSI Q 4 hours. On Levemir and would like to continue at home. -Hemoglobin A1c 12.6%, indicative of poor outpatient control. Diabetes coordinator consultation performed 08/14/12.    Hypertension  -Continue metoprolol.   Hyperlipidemia / Obesity  -Severe. Cholesterol 721. Triglycerides 2786.  -Dietitian consultation performed 08/14/12.   Code Status: Full.  Family Communication: None at bedside.  Disposition Plan: With continued improvement in condition   Consultants:  Diabetic coordinator   Dietitian  Procedures:  none  Antibiotics:  None  HPI/Subjective: No new complaints and no acute issues reported overnight.  Objective: Filed Vitals:   08/14/12 2111 08/14/12 2339 08/15/12 0454 08/15/12 1350  BP: 152/90  154/87 150/99  Pulse: 112  93 110  Temp: 100.1 F (37.8 C) 99.2 F (37.3 C) 99.1 F (37.3 C) 98.7 F (37.1 C)  TempSrc: Oral Oral Oral Oral  Resp:   19 18  Height:      Weight:   88.1 kg (194 lb 3.6 oz)   SpO2: 96%  92% 95%    Intake/Output Summary (Last 24 hours) at 08/15/12 1846 Last data filed at 08/15/12 1500  Gross per 24  hour  Intake   2400 ml  Output      0 ml  Net   2400 ml   Filed Weights   08/12/12 2214 08/13/12 0510 08/15/12 0454  Weight: 97.9 kg (215 lb 13.3 oz) 97.8 kg (215 lb 9.8 oz) 88.1 kg (194 lb 3.6 oz)    Exam:   General:  Pt in nad, alert and awake  Cardiovascular: RRR, No MRG  Respiratory: CTA BL, no wheezes  Abdomen: soft, NT, ND  Musculoskeletal: no cyanosis or clubbing   Data Reviewed: Basic Metabolic Panel:  Recent Labs Lab 08/13/12 1722 08/13/12 1919 08/13/12 2200 08/14/12 0421 08/15/12 0427  NA 129* 125* 125* 125* 132*  K 4.6 4.1 3.9 4.2 3.7  CL 93* 93* 94* 92* 98  CO2 16* 19 20 20 25   GLUCOSE 210* 222* 190* 201* 156*  BUN 9 9 8 9 13   CREATININE 0.71 0.70 0.70 0.75 0.78  CALCIUM 8.9 8.6 8.6 8.8 9.1   Liver Function Tests:  Recent Labs Lab 08/12/12 1510 08/13/12 0433  AST 25 20  ALT 27 19  ALKPHOS 200* 151*  BILITOT 0.5 0.4  PROT 9.0* 7.7  ALBUMIN 4.0 3.1*    Recent Labs Lab 08/12/12 1510 08/13/12 0433 08/14/12 0421  LIPASE 3120* 1338* 426*   No results found for this basename: AMMONIA,  in the last 168 hours CBC:  Recent Labs Lab 08/12/12 1510 08/13/12 0433  WBC 13.2* 14.3*  NEUTROABS 10.2* 10.8*  HGB 15.3 14.1  HCT 41.5 38.9*  MCV 78.9 81.7  PLT 286 309  Cardiac Enzymes:  Recent Labs Lab 08/12/12 2235  TROPONINI <0.30   BNP (last 3 results) No results found for this basename: PROBNP,  in the last 8760 hours CBG:  Recent Labs Lab 08/14/12 2334 08/15/12 0412 08/15/12 0735 08/15/12 1152 08/15/12 1715  GLUCAP 177* 148* 194* 336* 280*    Recent Results (from the past 240 hour(s))  URINE CULTURE     Status: None   Collection Time    08/12/12  4:13 PM      Result Value Range Status   Specimen Description URINE, CLEAN CATCH   Final   Special Requests Normal   Final   Culture  Setup Time 08/13/2012 09:11   Final   Colony Count 30,000 COLONIES/ML   Final   Culture SERRATIA MARCESCENS   Final   Report Status  08/15/2012 FINAL   Final   Organism ID, Bacteria SERRATIA MARCESCENS   Final     Studies: No results found.  Scheduled Meds: . atorvastatin  40 mg Oral q1800  . enoxaparin (LOVENOX) injection  40 mg Subcutaneous Q24H  . fenofibrate  160 mg Oral Daily  . insulin aspart  0-9 Units Subcutaneous Q4H  . insulin detemir  10 Units Subcutaneous Q12H  . living well with diabetes book   Does not apply Once  . metoprolol  50 mg Oral BID  . sodium chloride  3 mL Intravenous Q12H   Continuous Infusions: . sodium chloride 50 mL/hr at 08/15/12 1524    Principal Problem:   Acute pancreatitis Active Problems:   Diabetes mellitus   Hypertension   Hyperlipidemia   Metabolic acidosis   Obesity, unspecified   Hyponatremia    Time spent: > 35 minutes    Penny Pia  [facility] Pager [phone]. If 7PM-7AM, please contact night-coverage at [email or web address], password [facility] 08/15/2012, 6:46 PM  LOS: 3 days

## 2012-08-15 NOTE — Progress Notes (Signed)
Inpatient Diabetes Program Recommendations  AACE/ADA: New Consensus Statement on Inpatient Glycemic Control (2013)  Target Ranges:  Prepandial:   less than 140 mg/dL      Peak postprandial:   less than 180 mg/dL (1-2 hours)      Critically ill patients:  140 - 180 mg/dL   Reason for Visit: Spoke to patient again today.  He seems more agreeable to being discharged home on insulin.  He is being set-up on Match program after hospitalization and [facility].   Explained that Levemir is basal insulin given once a day.  He was glad about this.  He has given insulin before and feels comfortable self-administering insulin.  Discussed with Dr. Cena Benton.  He will also order oral diabetes medications at discharge.  Patient seems motivated to care for himself.

## 2012-08-15 NOTE — Care Management Note (Addendum)
    Page 1 of 2   09/17/2012     11:01:01 AM   CARE MANAGEMENT NOTE 09/17/2012  Patient:  Caleb [name], Caleb [name]   Account Number:  [id number]  Date Initiated:  08/14/2012  Documentation initiated by:  [facility]  Subjective/Objective Assessment:   62 year old male admitted with pancreatitis, hyperglycemia.     Action/Plan:   From home and will return there at d/c.   Anticipated DC Date:  08/16/2012   Anticipated DC Plan:  HOME/SELF CARE      DC Planning Services  CM consult  MATCH Program  PCP issues      Choice offered to / List presented to:             Status of service:  Completed, signed off Medicare Important Message given?  NA - LOS <3 / Initial given by admissions (If response is "NO", the following Medicare IM given date fields will be blank) Date Medicare IM given:   Date Additional Medicare IM given:    Discharge Disposition:  HOME/SELF CARE  Per UR Regulation:  Reviewed for med. necessity/level of care/duration of stay  If discussed at Long Length of Stay Meetings, dates discussed:    Comments:  09/17/12 Caleb Slattery RN,BSN NCM [phone] RECEIVED CALL FROM TARA CHARGE NURSE TO F/U ON CALL SHE RECEIVED FROM PATIENT ABOUT MED ASST.TEL#[phone],SPOKE TO PATIENT, & INFORMED HIM THAT THE MATCH PROGRAM WAS ONE TIME SERVICE OF MED ASST, FOR UP TO 34DAYS ON A SCRIPT,& NO REFILLS/7DAYS TO FILL.PATIENT HAS ALREADY USED THE SERVICE FOR THIS ADMISSION,THEREFORE NOT ELIGIBLE FOR A REFILL.ALSO PROVIDED HIM W[facility]#,& ADDRESS.PATIENT VOICED UNDERSTANDING.TC TO TARA& UPDATED ON OUTCOME.  08-15-12 Caleb Ishihara RN CM 1000 Spoke with patient at bedside. 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 Understands he has to be more compliant with meds and OP followup. 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 He understands he will likely need insulin at  d/c, he has been on it in the past. Discussed Match program with him, limited to once per year with a copay of $3 for 34 days worth of meds. He is agreeable. Plans to persue [facility] benefits at d/c but encouraged him to also look for medication assistance online through need[employer] med[employer], manufacturer patient assistance programs. He is agreeable to this. I also provided him with a list of PCP options if [facility] assignment was delayed.

## 2012-08-16 LAB — GLUCOSE, CAPILLARY
Glucose-Capillary: 236 mg/dL — ABNORMAL HIGH (ref 70–99)
Glucose-Capillary: 152 mg/dL — ABNORMAL HIGH (ref 70–99)
Glucose-Capillary: 164 mg/dL — ABNORMAL HIGH (ref 70–99)

## 2012-08-16 LAB — LIPASE, BLOOD: Lipase: 164 U/L — ABNORMAL HIGH (ref 11–59)

## 2012-08-16 MED ORDER — METOPROLOL TARTRATE 100 MG PO TABS
100.0000 mg | ORAL_TABLET | Freq: Two times a day (BID) | ORAL | Status: DC
  Administered 2012-08-16: 100 mg via ORAL
  Filled 2012-08-16 (×2): qty 1

## 2012-08-16 MED ORDER — METFORMIN HCL 1000 MG PO TABS: 1000.0000 mg | ORAL_TABLET | Freq: Two times a day (BID) | ORAL | Status: DC

## 2012-08-16 MED ORDER — METOPROLOL TARTRATE 100 MG PO TABS: 100.0000 mg | ORAL_TABLET | Freq: Two times a day (BID) | ORAL | Status: DC

## 2012-08-16 MED ORDER — GLIMEPIRIDE 4 MG PO TABS: 4.0000 mg | ORAL_TABLET | Freq: Every day | ORAL | Status: DC

## 2012-08-16 MED ORDER — ATORVASTATIN CALCIUM 40 MG PO TABS: 40.0000 mg | ORAL_TABLET | Freq: Every day | ORAL | Status: DC

## 2012-08-16 MED ORDER — INSULIN DETEMIR 100 UNIT/ML ~~LOC~~ SOLN: 20.0000 [IU] | Freq: Every day | SUBCUTANEOUS | Status: DC

## 2012-08-16 MED ORDER — FENOFIBRATE 160 MG PO TABS: 160.0000 mg | ORAL_TABLET | Freq: Every day | ORAL | Status: DC

## 2012-08-16 NOTE — Progress Notes (Signed)
Pt discharged to home.  Discharge instructions given and questions answered.  Transported out via wheelchair.  Berton Bon RN-BC

## 2012-08-16 NOTE — Discharge Summary (Signed)
Physician Discharge Summary  Caleb [name] NFA:[id number] DOB: 1950-05-03 DOA: 08/12/2012  PCP: No primary provider on file.  Admit date: 08/12/2012 Discharge date: 08/16/2012  Time spent: > 35 minutes  Recommendations for Outpatient Follow-up:  1. Please be sure to follow patient's blood sugar log and adjust his anti hypoglycemic agents pending values. 2. Also be sure to encourage a low fat diet. 3. Monitor blood pressures as his medication was recently increased. 4. Also adjust patient has had recurrent pancreatitis most likely due to elevated lipids.  Monitor and adjust his anti hyperlipidemia medication.  Discharge Diagnoses:  Principal Problem:   Acute pancreatitis Active Problems:   Diabetes mellitus   Hypertension   Hyperlipidemia   Metabolic acidosis   Obesity, unspecified   Hyponatremia   Discharge Condition: stable  Diet recommendation: low fat, diabetic diet  Filed Weights   08/13/12 0510 08/15/12 0454 08/16/12 0422  Weight: 97.8 kg (215 lb 9.8 oz) 88.1 kg (194 lb 3.6 oz) 89.5 kg (197 lb 5 oz)    History of present illness:  62 y/o with history of pancreatitis, DM, HTN and HPL.  Presented to the ED complaining of abdominal pain initial lab work showed an elevated lipase level.  Hospital Course:   Acute pancreatitis  -History of significant hypertriglyceridemia, confirmed on repeat FLP with cholesterol at 721 and triglycerides at 2786.  -Tolerated low fat diet. -resolved with lipase levels trending down. -Continue fenofibrate and statin.  -Will recommend that patient continue monitoring his lipids closely as outpatient, low fat diet, and close follow up with primary care physician. Marland Kitchen  Hyponatremia  -Is improving. Hyperglycemia contributing.  - Improved with IVF's.  - Should improve with continued improvement in po intake.   Uncontrolled Diabetes mellitus / DKA / Metabolic acidosis  -Noted to be acidotic with an anion gap of 22.  -Initially placed on an  insulin drip per Glucomander DKA protocol. Bicarb normalized, gap closed.  -DKA resolved -Hemoglobin A1c 12.6%, indicative of poor outpatient control. Diabetes coordinator consultation performed 08/14/12.  - Discharge on amaryl, metformin, and levemir  Hypertension  -Increased metoprolol dose given elevated heart rate and elevated uncontrolled blood pressures.  Hyperlipidemia / Obesity  -Severe. Cholesterol 721. Triglycerides 2786.  -Dietitian consultation performed 08/14/12.    Procedures:  none  Consultations:  None  Discharge Exam: Filed Vitals:   08/15/12 1350 08/15/12 2112 08/16/12 0422 08/16/12 0525  BP: 150/99 159/103 166/101 156/90  Pulse: 110  117 120  Temp: 98.7 F (37.1 C) 99.2 F (37.3 C) 98.2 F (36.8 C)   TempSrc: Oral Oral Oral   Resp: 18 19 19    Height:      Weight:   89.5 kg (197 lb 5 oz)   SpO2: 95% 96% 97%     General: Pt in NAD, Alert and Awake Cardiovascular: RRR, no MRG Respiratory: CTA BL, no wheezes  Discharge Instructions  Discharge Orders   Future Orders Complete By Expires     Call MD for:  difficulty breathing, headache or visual disturbances  As directed     Call MD for:  severe uncontrolled pain  As directed     Call MD for:  temperature >100.4  As directed     Diet - low sodium heart healthy  As directed     Discharge instructions  As directed     Comments:      Please be sure to follow up with your primary care physician in 1-2 weeks or sooner should any new concerns arise.  Also you are to eat a low fat diet and have your blood pressure rechecked as an outpatient. Monitor your blood sugars daily and record the values for your primary doctor to review.    Increase activity slowly  As directed         Medication List    TAKE these medications       atorvastatin 40 MG tablet  Commonly known as:  LIPITOR  Take 1 tablet (40 mg total) by mouth daily at 6 PM.     fenofibrate 160 MG tablet  Take 1 tablet (160 mg total) by  mouth daily.     insulin detemir 100 UNIT/ML injection  Commonly known as:  LEVEMIR  Inject 0.2 mLs (20 Units total) into the skin at bedtime.     metoprolol 100 MG tablet  Commonly known as:  LOPRESSOR  Take 1 tablet (100 mg total) by mouth 2 (two) times daily.      Also Amaryl 4 mg po daily Metformin 1000 mg po BID   No Known Allergies    The results of significant diagnostics from this hospitalization (including imaging, microbiology, ancillary and laboratory) are listed below for reference.    Significant Diagnostic Studies: Ct Abdomen Pelvis W Contrast  08/12/2012   *RADIOLOGY REPORT*  Clinical Data: Central abdominal pain, radiating to the left upper quadrant.  Nausea.  CT ABDOMEN AND PELVIS WITH CONTRAST  Technique:  Multidetector CT imaging of the abdomen and pelvis was performed following the standard protocol during bolus administration of intravenous contrast.  Contrast: 100 mL of Omnipaque 300 IV contrast  Comparison: CT of the abdomen and pelvis performed 09/16/2009, and abdominal ultrasound performed 09/17/2009  Findings: Minimal bibasilar atelectasis is noted.  Scattered coronary artery calcifications are seen.  Calcification is noted at the aortic valve.  There is diffuse soft tissue inflammation about the pancreatic body and tail, compatible with acute pancreatitis.  Associated trace fluid is seen tracking inferiorly along Gerota's fascia on the left side.  There appears to be a 1.9 cm pseudocyst along the body of the pancreas.  No definite masses are seen.  The pancreatic head is grossly unremarkable in appearance.  There is no definite evidence of devascularization.  The liver and spleen are unremarkable in appearance.  The gallbladder is within normal limits.  The adrenal glands are unremarkable.  The kidneys are unremarkable in appearance.  There is no evidence of hydronephrosis.  No renal or ureteral stones are seen. Nonspecific perinephric stranding is noted bilaterally.   No free fluid is identified.  The small bowel is unremarkable in appearance.  The stomach is within normal limits.  No acute vascular abnormalities are seen.  Scattered calcification is noted along the abdominal aorta and its branches.  The appendix is normal in caliber, without evidence for appendicitis.  Mild soft tissue inflammation is noted about the splenic flexure of colon, reflecting the adjacent pancreatic process.  The colon is otherwise unremarkable in appearance.  The bladder is moderately distended and grossly unremarkable in appearance.  The prostate remains normal in size.  No inguinal lymphadenopathy is seen.  No acute osseous abnormalities are identified.  There is diffuse sclerosis and heterogeneity involving the right ilium and ischium, extending to the upper sacrum and the left side of the pubic symphysis.  This likely reflects Paget's disease. Likely associated vague heterogeneity is noted within the visualized vertebral bodies.  Facet disease is noted at the lower lumbar spine.  IMPRESSION:  1.  Acute pancreatitis noted,  with diffuse soft tissue inflammation about the pancreatic body and tail, and trace fluid tracking inferiorly along Gerota's fascia on the left side.  1.9 cm pseudocyst noted along the body of the pancreas; no evidence of devascularization at this time. 2. Scattered coronary artery calcifications seen. 3.  Calcification of the aortic valve. 4.  Changes of Paget's disease noted at the pelvis and likely along the lumbar spine. 5.  Scattered calcification along the abdominal aorta and its branches.   Original Report Authenticated By: Tonia Ghent, M.D.   Dg Abd Acute W/chest  08/12/2012   *RADIOLOGY REPORT*  Clinical Data: Abdominal pain.  Constipation.  Prior history of pancreatitis.  ACUTE ABDOMEN SERIES (ABDOMEN 2 VIEW & CHEST 1 VIEW)  Comparison: CT abdomen pelvis 09/16/2009.  Two-view chest x-ray of that same date.  Acute abdomen series 02/21/2008.  Findings: Bowel gas  pattern unremarkable without evidence of obstruction or significant ileus.  No evidence of free air or significant air fluid levels on the erect image.  Moderate stool burden.  Phleboliths low in both sides of the pelvis.  No visible opaque urinary tract calculi.  Degenerative changes involving the lower lumbar spine.  Paget's disease involving the right hemipelvis, unchanged.  Cardiac silhouette normal in size, unchanged.  Thoracic aorta tortuous and mildly atherosclerotic, unchanged.  Hilar and mediastinal contours otherwise unremarkable.  Prominent bronchovascular markings diffusely and moderate central peribronchial thickening, more so than on the prior examinations. No localized airspace consolidation.  No pleural effusions.  IMPRESSION:  1.  No acute abdominal abnormality. 2.  Moderate changes of bronchitis and/or asthma without localized airspace pneumonia. 3.  Paget's disease involving the right hemipelvis.   Original Report Authenticated By: Hulan Saas, M.D.    Microbiology: Recent Results (from the past 240 hour(s))  URINE CULTURE     Status: None   Collection Time    08/12/12  4:13 PM      Result Value Range Status   Specimen Description URINE, CLEAN CATCH   Final   Special Requests Normal   Final   Culture  Setup Time 08/13/2012 09:11   Final   Colony Count 30,000 COLONIES/ML   Final   Culture SERRATIA MARCESCENS   Final   Report Status 08/15/2012 FINAL   Final   Organism ID, Bacteria SERRATIA MARCESCENS   Final     Labs: Basic Metabolic Panel:  Recent Labs Lab 08/13/12 1722 08/13/12 1919 08/13/12 2200 08/14/12 0421 08/15/12 0427  NA 129* 125* 125* 125* 132*  K 4.6 4.1 3.9 4.2 3.7  CL 93* 93* 94* 92* 98  CO2 16* 19 20 20 25   GLUCOSE 210* 222* 190* 201* 156*  BUN 9 9 8 9 13   CREATININE 0.71 0.70 0.70 0.75 0.78  CALCIUM 8.9 8.6 8.6 8.8 9.1   Liver Function Tests:  Recent Labs Lab 08/12/12 1510 08/13/12 0433  AST 25 20  ALT 27 19  ALKPHOS 200* 151*  BILITOT  0.5 0.4  PROT 9.0* 7.7  ALBUMIN 4.0 3.1*    Recent Labs Lab 08/12/12 1510 08/13/12 0433 08/14/12 0421 08/16/12 0414  LIPASE 3120* 1338* 426* 164*   No results found for this basename: AMMONIA,  in the last 168 hours CBC:  Recent Labs Lab 08/12/12 1510 08/13/12 0433  WBC 13.2* 14.3*  NEUTROABS 10.2* 10.8*  HGB 15.3 14.1  HCT 41.5 38.9*  MCV 78.9 81.7  PLT 286 309   Cardiac Enzymes:  Recent Labs Lab 08/12/12 2235  TROPONINI <0.30   BNP: BNP (  last 3 results) No results found for this basename: PROBNP,  in the last 8760 hours CBG:  Recent Labs Lab 08/15/12 1715 08/15/12 2002 08/15/12 2348 08/16/12 0418 08/16/12 0725  GLUCAP 280* 283* 210* 152* 164*       Signed:  Penny Pia  [facility] 08/16/2012, 9:40 AM

## 2012-08-26 ENCOUNTER — Encounter (HOSPITAL_COMMUNITY): Payer: Self-pay | Admitting: *Deleted

## 2012-09-20 ENCOUNTER — Telehealth: Payer: Self-pay | Admitting: General Practice

## 2012-09-27 ENCOUNTER — Encounter: Payer: Self-pay | Admitting: Family Medicine

## 2012-09-27 ENCOUNTER — Ambulatory Visit: Payer: Self-pay | Attending: Family Medicine | Admitting: Family Medicine

## 2012-09-27 VITALS — BP 192/108 | HR 96 | Temp 98.7°F | Resp 16 | Ht 67.0 in | Wt 195.4 lb

## 2012-09-27 DIAGNOSIS — N529 Male erectile dysfunction, unspecified: Secondary | ICD-10-CM

## 2012-09-27 DIAGNOSIS — K859 Acute pancreatitis without necrosis or infection, unspecified: Secondary | ICD-10-CM

## 2012-09-27 DIAGNOSIS — E785 Hyperlipidemia, unspecified: Secondary | ICD-10-CM

## 2012-09-27 DIAGNOSIS — I10 Essential (primary) hypertension: Secondary | ICD-10-CM

## 2012-09-27 HISTORY — DX: Acute pancreatitis without necrosis or infection, unspecified: K85.90

## 2012-09-27 HISTORY — DX: Male erectile dysfunction, unspecified: N52.9

## 2012-09-27 HISTORY — DX: Hyperlipidemia, unspecified: E78.5

## 2012-09-27 LAB — COMPLETE METABOLIC PANEL WITH GFR
ALT: 21 U/L (ref 0–53)
AST: 21 U/L (ref 0–37)
Albumin: 4.4 g/dL (ref 3.5–5.2)
Alkaline Phosphatase: 124 U/L — ABNORMAL HIGH (ref 39–117)
BUN: 32 mg/dL — ABNORMAL HIGH (ref 6–23)
CO2: 25 mEq/L (ref 19–32)
Calcium: 9.4 mg/dL (ref 8.4–10.5)
Chloride: 100 mEq/L (ref 96–112)
Creat: 1.82 mg/dL — ABNORMAL HIGH (ref 0.50–1.35)
GFR, Est African American: 45 mL/min — ABNORMAL LOW
GFR, Est Non African American: 39 mL/min — ABNORMAL LOW
Glucose, Bld: 246 mg/dL — ABNORMAL HIGH (ref 70–99)
Potassium: 4.2 mEq/L (ref 3.5–5.3)
Sodium: 135 mEq/L (ref 135–145)
Total Bilirubin: 0.3 mg/dL (ref 0.3–1.2)
Total Protein: 7 g/dL (ref 6.0–8.3)

## 2012-09-27 LAB — LIPID PANEL
Cholesterol: 231 mg/dL — ABNORMAL HIGH (ref 0–200)
HDL: 37 mg/dL — ABNORMAL LOW (ref >39)
Total CHOL/HDL Ratio: 6.2 Ratio
Triglycerides: 415 mg/dL — ABNORMAL HIGH (ref <150)

## 2012-09-27 LAB — LIPASE: Lipase: 47 U/L (ref 0–75)

## 2012-09-27 LAB — TSH: TSH: 1.294 u[IU]/mL (ref 0.350–4.500)

## 2012-09-27 MED ORDER — FENOFIBRATE 160 MG PO TABS: 160.0000 mg | ORAL_TABLET | Freq: Every day | ORAL | Status: DC

## 2012-09-27 MED ORDER — LISINOPRIL-HYDROCHLOROTHIAZIDE 10-12.5 MG PO TABS: 1.0000 | ORAL_TABLET | Freq: Every day | ORAL | Status: DC

## 2012-09-27 MED ORDER — ATORVASTATIN CALCIUM 40 MG PO TABS: 40.0000 mg | ORAL_TABLET | Freq: Every day | ORAL | Status: DC

## 2012-09-27 MED ORDER — SILDENAFIL CITRATE 50 MG PO TABS: 100.0000 mg | ORAL_TABLET | Freq: Every day | ORAL | Status: DC | PRN

## 2012-09-27 MED ORDER — INSULIN DETEMIR 100 UNIT/ML ~~LOC~~ SOLN: 20.0000 [IU] | Freq: Every day | SUBCUTANEOUS | Status: DC

## 2012-09-27 MED ORDER — METOPROLOL TARTRATE 100 MG PO TABS: 100.0000 mg | ORAL_TABLET | Freq: Two times a day (BID) | ORAL | Status: DC

## 2012-09-27 MED ORDER — METFORMIN HCL 1000 MG PO TABS: 1000.0000 mg | ORAL_TABLET | Freq: Two times a day (BID) | ORAL | Status: DC

## 2012-09-27 NOTE — Progress Notes (Signed)
Patient ID: Caleb [name], male   DOB: 09-Dec-1950, 62 y.o.   MRN: [id number]  CC: establish and hospital follow up   HPI: This patient presented today to the clinic to followup from the hospitalization he recently had 4 acute pancreatitis.  He was found to have severely elevated lipids.  His dyslipidemia something that he's had for several years.  He was hospitalized about 3 years ago with a similar episode of pancreatitis and was discovered at that time to have poorly controlled diabetes mellitus.  His hemoglobin A1c is 12.6.  It was the same 3 years ago.  The patient reports that he had been out of his medications for several months.  He also reports that he is taking his medications now with assistance from the hospital discharge program.  The patient reports that he needs refills on his medications at this time. He Would Also like to Have Followup Labs Done. The patient reports that he has been taking his medications.  He reports that he is having some erection dysfunction and would like to try Viagra.  He reports that he has taken it in the past and it has worked well for him.  No Known Allergies Past Medical History  Diagnosis Date  . Pancreatitis   . Diabetes mellitus without complication   . Hypertension   . Hypercholesteremia    No current outpatient prescriptions on file prior to visit.   No current facility-administered medications on file prior to visit.   Family History  Problem Relation Age of Onset  . CAD Father   . Hypertension Father    History   Social History  . Marital Status: Married    Spouse Name: N/A    Number of Children: N/A  . Years of Education: N/A   Occupational History  . Not on file.   Social History Main Topics  . Smoking status: Former Smoker    Types: Cigarettes  . Smokeless tobacco: Never Used  . Alcohol Use: No  . Drug Use: Not on file  . Sexually Active: Not on file   Other Topics Concern  . Not on file   Social History Narrative  . No  narrative on file    Review of Systems  Constitutional: Negative for fever, chills, diaphoresis, activity change, appetite change and fatigue.  HENT: Negative for ear pain, nosebleeds, congestion, facial swelling, rhinorrhea, neck pain, neck stiffness and ear discharge.   Eyes: Negative for pain, discharge, redness, itching and visual disturbance.  Respiratory: Negative for cough, choking, chest tightness, shortness of breath, wheezing and stridor.   Cardiovascular: Negative for chest pain, palpitations and leg swelling.  Gastrointestinal: Negative for abdominal distention.  Genitourinary: Negative for dysuria, urgency, frequency, hematuria, flank pain, decreased urine volume, difficulty urinating and dyspareunia.  Musculoskeletal: Negative for back pain, joint swelling, arthralgias and gait problem.  Neurological: Negative for dizziness, tremors, seizures, syncope, facial asymmetry, speech difficulty, weakness, light-headedness, numbness and headaches.  Hematological: Negative for adenopathy. Does not bruise/bleed easily.  Psychiatric/Behavioral: Negative for hallucinations, behavioral problems, confusion, dysphoric mood, decreased concentration and agitation.    Objective:   Filed Vitals:   09/27/12 1525  BP: 192/108  Pulse: 96  Temp: 98.7 F (37.1 C)  Resp: 16    Physical Exam  Constitutional: Appears well-developed and well-nourished. No distress.  HENT: Normocephalic. External right and left ear normal. Oropharynx is clear and moist.  Eyes: Conjunctivae and EOM are normal. PERRLA, no scleral icterus.  Neck: Normal ROM. Neck supple. No JVD. No  tracheal deviation. No thyromegaly.  CVS: RRR, S1/S2 +, no murmurs, no gallops, no carotid bruit.  Pulmonary: Effort and breath sounds normal, no stridor, rhonchi, wheezes, rales.  Abdominal: Soft. BS +,  no distension, tenderness, rebound or guarding.  Musculoskeletal: Normal range of motion. No edema and no tenderness.   Lymphadenopathy: No lymphadenopathy noted, cervical, inguinal. Neuro: Alert. Normal reflexes, muscle tone coordination. No cranial nerve deficit. Skin: Skin is warm and dry. No rash noted. Not diaphoretic. No erythema. No pallor.  Psychiatric: Normal mood and affect. Behavior, judgment, thought content normal.   Lab Results  Component Value Date   WBC 14.3* 08/13/2012   HGB 14.1 08/13/2012   HCT 38.9* 08/13/2012   MCV 81.7 08/13/2012   PLT 309 08/13/2012   Lab Results  Component Value Date   CREATININE 0.78 08/15/2012   BUN 13 08/15/2012   NA 132* 08/15/2012   K 3.7 08/15/2012   CL 98 08/15/2012   CO2 25 08/15/2012    Lab Results  Component Value Date   HGBA1C 12.6* 08/13/2012   Lipid Panel     Component Value Date/Time   CHOL 721* 08/13/2012 0435   TRIG 2786* 08/13/2012 0435   HDL NOT REPORTED DUE TO HIGH TRIGLYCERIDES 08/13/2012 0435   CHOLHDL NOT REPORTED DUE TO HIGH TRIGLYCERIDES 08/13/2012 0435   VLDL UNABLE TO CALCULATE IF TRIGLYCERIDE OVER 400 mg/dL 4/54/0981 1914   LDLCALC UNABLE TO CALCULATE IF TRIGLYCERIDE OVER 400 mg/dL 7/82/9562 1308     Assessment and plan:   Patient Active Problem List   Diagnosis Date Noted  . Obesity, unspecified 08/14/2012  . Hyponatremia 08/14/2012  . Acute pancreatitis 08/12/2012  . Diabetes mellitus 08/12/2012  . Hypertension 08/12/2012  . Hyperlipidemia 08/12/2012  . Metabolic acidosis 08/12/2012   The patient's blood pressure is poorly controlled at this time.  He reports that he is taking his medications.  I want to have a subtotal HCTZ to his blood pressure regimen.  I did refill his metoprolol 100 mg by mouth twice a day.  This patient has poorly controlled type 2 diabetes mellitus that is insulin requiring.  I did refill his medications today.  In addition, recommended monitoring blood glucose 4 times per day.  Hypoglycemia precautions per discussed.  In addition, recommended ongoing diet and exercise.  Gave patient prescription for  Viagra to use as needed.  He will try with 50 mg tablets and increase to 100 mg if needed.  Recheck his labs today including retesting the lipase level.  Continue fenofibrate daily for triglycerides and atorvastatin for LDL and total cholesterol reduction.  The patient is at high-risk for acute and chronic complications of poorly controlled diabetes mellitus.  This was explained to him in detail and he verbalized understanding.  Followup in one to 2 months.  The patient was given clear instructions to go to ER or return to medical center if symptoms don't improve, worsen or new problems develop.  The patient verbalized understanding.  The patient was told to call to get any lab results if not heard anything in the next week.    Rodney Langton, MD, CDE, FAAFP [facility] [place], [place]

## 2012-09-27 NOTE — Patient Instructions (Addendum)
Blood Sugar Monitoring, Adult GLUCOSE METERS FOR SELF-MONITORING OF BLOOD GLUCOSE  It is important to be able to correctly measure your blood sugar (glucose). You can use a blood glucose monitor (a small battery-operated device) to check your glucose level at any time. This allows you and your caregiver to monitor your diabetes and to determine how well your treatment plan is working. The process of monitoring your blood glucose with a glucose meter is called self-monitoring of blood glucose (SMBG). When people with diabetes control their blood sugar, they have better health. To test for glucose with a typical glucose meter, place the disposable strip in the meter. Then place a small sample of blood on the "test strip." The test strip is coated with chemicals that combine with glucose in blood. The meter measures how much glucose is present. The meter displays the glucose level as a number. Several new models can record and store a number of test results. Some models can connect to personal computers to store test results or print them out.  Newer meters are often easier to use than older models. Some meters allow you to get blood from places other than your fingertip. Some new models have automatic timing, error codes, signals, or barcode readers to help with proper adjustment (calibration). Some meters have a large display screen or spoken instructions for people with visual impairments.  INSTRUCTIONS FOR USING GLUCOSE METERS  Wash your hands with soap and warm water, or clean the area with alcohol. Dry your hands completely.  Prick the side of your fingertip with a lancet (a sharp-pointed tool used by hand).  Hold the hand down and gently milk the finger until a small drop of blood appears. Catch the blood with the test strip.  Follow the instructions for inserting the test strip and using the SMBG meter. Most meters require the meter to be turned on and the test strip to be inserted before applying  the blood sample.  Record the test result.  Read the instructions carefully for both the meter and the test strips that go with it. Meter instructions are found in the user manual. Keep this manual to help you solve any problems that may arise. Many meters use "error codes" when there is a problem with the meter, the test strip, or the blood sample on the strip. You will need the manual to understand these error codes and fix the problem.  New devices are available such as laser lancets and meters that can test blood taken from "alternative sites" of the body, other than fingertips. However, you should use standard fingertip testing if your glucose changes rapidly. Also, use standard testing if:  You have eaten, exercised, or taken insulin in the past 2 hours.  You think your glucose is low.  You tend to not feel symptoms of low blood glucose (hypoglycemia).  You are ill or under stress.  Clean the meter as directed by the manufacturer.  Test the meter for accuracy as directed by the manufacturer.  Take your meter with you to your caregiver's office. This way, you can test your glucose in front of your caregiver to make sure you are using the meter correctly. Your caregiver can also take a sample of blood to test using a routine lab method. If values on the glucose meter are close to the lab results, you and your caregiver will see that your meter is working well and you are using good technique. Your caregiver will advise you about what   to do if the results do not match. FREQUENCY OF TESTING  Your caregiver will tell you how often you should check your blood glucose. This will depend on your type of diabetes, your current level of diabetes control, and your types of medicines. The following are general guidelines, but your care plan may be different. Record all your readings and the time of day you took them for review with your caregiver.   Diabetes type 1.  When you are using insulin  with good diabetic control (either multiple daily injections or via a pump), you should check your glucose 4 times a day.  If your diabetes is not well controlled, you may need to monitor more frequently, including before meals and 2 hours after meals, at bedtime, and occasionally between 2 a.m. and 3 a.m.  You should always check your glucose before a dose of insulin or before changing the rate on your insulin pump.  Diabetes type 2.  Guidelines for SMBG in diabetes type 2 are not as well defined.  If you are on insulin, follow the guidelines above.  If you are on medicines, but not insulin, and your glucose is not well controlled, you should test at least twice daily.  If you are not on insulin, and your diabetes is controlled with medicines or diet alone, you should test at least once daily, usually before breakfast.  A weekly profile will help your caregiver advise you on your care plan. The week before your visit, check your glucose before a meal and 2 hours after a meal at least daily. You may want to test before and after a different meal each day so you and your caregiver can tell how well controlled your blood sugars are throughout the course of a 24 hour period.  Gestational diabetes (diabetes during pregnancy).  Frequent testing is often necessary. Accurate timing is important.  If you are not on insulin, check your glucose 4 times a day. Check it before breakfast and 1 hour after the start of each meal.  If you are on insulin, check your glucose 6 times a day. Check it before each meal and 1 hour after the first bite of each meal.  General guidelines.  More frequent testing is required at the start of insulin treatment. Your caregiver will instruct you.  Test your glucose any time you suspect you have low blood sugar (hypoglycemia).  You should test more often when you change medicines, when you have unusual stress or illness, or in other unusual circumstances. OTHER  THINGS TO KNOW ABOUT GLUCOSE METERS  Measurement Range. Most glucose meters are able to read glucose levels over a broad range of values from as low as 0 to as high as 600 mg/dL. If you get an extremely high or low reading from your meter, you should first confirm it with another reading. Report very high or very low readings to your caregiver.  Whole Blood Glucose versus Plasma Glucose. Some older home glucose meters measure glucose in your whole blood. In a lab or when using some newer home glucose meters, the glucose is measured in your plasma (one component of blood). The difference can be important. It is important for you and your caregiver to know whether your meter gives its results as "whole blood equivalent" or "plasma equivalent."  Display of High and Low Glucose Values. Part of learning how to operate a meter is understanding what the meter results mean. Know how high and low glucose concentrations are displayed   on your meter.  Factors that Affect Glucose Meter Performance. The accuracy of your test results depends on many factors and varies depending on the brand and type of meter. These factors include:  Low red blood cell count (anemia).  Substances in your blood (such as uric acid, vitamin C, and others).  Environmental factors (temperature, humidity, altitude).  Name-brand versus generic test strips.  Calibration. Make sure your meter is set up properly. It is a good idea to do a calibration test with a control solution recommended by the manufacturer of your meter whenever you begin using a fresh bottle of test strips. This will help verify the accuracy of your meter.  Improperly stored, expired, or defective test strips. Keep your strips in a dry place with the lid on.  Soiled meter.  Inadequate blood sample. NEW TECHNOLOGIES FOR GLUCOSE TESTING Alternative site testing Some glucose meters allow testing blood from alternative sites. These include the:  Upper  arm.  Forearm.  Base of the thumb.  Thigh. Sampling blood from alternative sites may be desirable. However, it may have some limitations. Blood in the fingertips show changes in glucose levels more quickly than blood in other parts of the body. This means that alternative site test results may be different from fingertip test results, not because of the meter's ability to test accurately, but because the actual glucose concentration can be different.  Continuous Glucose Monitoring Devices to measure your blood glucose continuously are available, and others are in development. These methods can be more expensive than self-monitoring with a glucose meter. However, it is uncertain how effective and reliable these devices are. Your caregiver will advise you if this approach makes sense for you. IF BLOOD SUGARS ARE CONTROLLED, PEOPLE WITH DIABETES REMAIN HEALTHIER.  SMBG is an important part of the treatment plan of patients with diabetes mellitus. Below are reasons for using SMBG:   It confirms that your glucose is at a specific, healthy level.  It detects hypoglycemia and severe hyperglycemia.  It allows you and your caregiver to make adjustments in response to changes in lifestyle for individuals requiring medicine.  It determines the need for starting insulin therapy in temporary diabetes that happens during pregnancy (gestational diabetes). Document Released: 03/10/2003 Document Revised: 05/30/2011 Document Reviewed: 07/01/2010 Exit[employer] Patient Information 2014 [employer]. Hypertension As your heart beats, it forces blood through your arteries. This force is your blood pressure. If the pressure is too high, it is called hypertension (HTN) or high blood pressure. HTN is dangerous because you may have it and not know it. High blood pressure may mean that your heart has to work harder to pump blood. Your arteries may be narrow or stiff. The extra work puts you at risk for heart disease,  stroke, and other problems.  Blood pressure consists of two numbers, a higher number over a lower, 110/72, for example. It is stated as "110 over 72." The ideal is below 120 for the top number (systolic) and under 80 for the bottom (diastolic). Write down your blood pressure today. You should pay close attention to your blood pressure if you have certain conditions such as:  Heart failure.  Prior heart attack.  Diabetes  Chronic kidney disease.  Prior stroke.  Multiple risk factors for heart disease. To see if you have HTN, your blood pressure should be measured while you are seated with your arm held at the level of the heart. It should be measured at least twice. A one-time elevated blood pressure   reading (especially in the Emergency Department) does not mean that you need treatment. There may be conditions in which the blood pressure is different between your right and left arms. It is important to see your caregiver soon for a recheck. Most people have essential hypertension which means that there is not a specific cause. This type of high blood pressure may be lowered by changing lifestyle factors such as:  Stress.  Smoking.  Lack of exercise.  Excessive weight.  Drug/tobacco/alcohol use.  Eating less salt. Most people do not have symptoms from high blood pressure until it has caused damage to the body. Effective treatment can often prevent, delay or reduce that damage. TREATMENT  When a cause has been identified, treatment for high blood pressure is directed at the cause. There are a large number of medications to treat HTN. These fall into several categories, and your caregiver will help you select the medicines that are best for you. Medications may have side effects. You should review side effects with your caregiver. If your blood pressure stays high after you have made lifestyle changes or started on medicines,   Your medication(s) may need to be changed.  Other  problems may need to be addressed.  Be certain you understand your prescriptions, and know how and when to take your medicine.  Be sure to follow up with your caregiver within the time frame advised (usually within two weeks) to have your blood pressure rechecked and to review your medications.  If you are taking more than one medicine to lower your blood pressure, make sure you know how and at what times they should be taken. Taking two medicines at the same time can result in blood pressure that is too low. SEEK IMMEDIATE MEDICAL CARE IF:  You develop a severe headache, blurred or changing vision, or confusion.  You have unusual weakness or numbness, or a faint feeling.  You have severe chest or abdominal pain, vomiting, or breathing problems. MAKE SURE YOU:   Understand these instructions.  Will watch your condition.  Will get help right away if you are not doing well or get worse. Document Released: 03/07/2005 Document Revised: 05/30/2011 Document Reviewed: 10/26/2007 Exit[employer] Patient Information 2014 [employer].  

## 2012-09-27 NOTE — Progress Notes (Signed)
Pt here to establish care for diabetes management. Ran out of insulin and bp meds 2 weeks ago. Pt was recently hospitalized for acute Pancreatitis. Denies h/a or pain at this time

## 2012-09-28 NOTE — Progress Notes (Signed)
Quick Note:  Please inform patient that his blood work looks much improved. His pancreas lipase enzyme has gotten back down to normal. He does appear to be dehydrated. Please have him drink extra clear sugar-free fluids over the next several days to flush his kidneys. Also his cholesterol levels have started to really improve. Continue taking cholesterol medications and working on controlling blood sugar.thyroid tests came back within normal limits.   Rodney Langton, MD, CDE, FAAFP [facility] [place], [place]   ______

## 2012-10-09 ENCOUNTER — Telehealth: Payer: Self-pay | Admitting: *Deleted

## 2012-10-09 NOTE — Telephone Encounter (Signed)
10/09/12 Patient unavailable message left blood work was much improved. Instructed to drink extra fluids clear sugar free appear dehydrated.And continue  Working on controlling blood sugar. P.Biana Haggar,RN BSN [facility]

## 2012-12-04 ENCOUNTER — Ambulatory Visit: Payer: Self-pay

## 2012-12-11 ENCOUNTER — Ambulatory Visit: Payer: Self-pay | Admitting: Internal Medicine

## 2013-02-19 ENCOUNTER — Encounter: Payer: Self-pay | Admitting: Internal Medicine

## 2013-02-19 ENCOUNTER — Other Ambulatory Visit: Payer: Self-pay | Admitting: Internal Medicine

## 2013-02-19 ENCOUNTER — Ambulatory Visit: Payer: Self-pay | Attending: Internal Medicine | Admitting: Internal Medicine

## 2013-02-19 VITALS — BP 163/119 | HR 96 | Temp 98.8°F | Resp 16 | Ht 67.0 in | Wt 193.0 lb

## 2013-02-19 DIAGNOSIS — I10 Essential (primary) hypertension: Secondary | ICD-10-CM

## 2013-02-19 LAB — GLUCOSE, POCT (MANUAL RESULT ENTRY): POC Glucose: 323 mg/dl — AB (ref 70–99)

## 2013-02-19 LAB — POCT GLYCOSYLATED HEMOGLOBIN (HGB A1C): Hemoglobin A1C: 9.6

## 2013-02-19 MED ORDER — AMLODIPINE BESYLATE 5 MG PO TABS: 5.0000 mg | ORAL_TABLET | Freq: Every day | ORAL | Status: DC

## 2013-02-19 MED ORDER — METFORMIN HCL 1000 MG PO TABS: 1000.0000 mg | ORAL_TABLET | Freq: Two times a day (BID) | ORAL | Status: DC

## 2013-02-19 MED ORDER — ATORVASTATIN CALCIUM 40 MG PO TABS: 40.0000 mg | ORAL_TABLET | Freq: Every day | ORAL | Status: DC

## 2013-02-19 MED ORDER — FREESTYLE SYSTEM KIT: 1.0000 | PACK | Freq: Three times a day (TID) | Status: DC

## 2013-02-19 MED ORDER — LISINOPRIL 10 MG PO TABS: 10.0000 mg | ORAL_TABLET | Freq: Every day | ORAL | Status: DC

## 2013-02-19 MED ORDER — SILDENAFIL CITRATE 50 MG PO TABS: 100.0000 mg | ORAL_TABLET | Freq: Every day | ORAL | Status: DC | PRN

## 2013-02-19 MED ORDER — METOPROLOL TARTRATE 100 MG PO TABS: 100.0000 mg | ORAL_TABLET | Freq: Two times a day (BID) | ORAL | Status: DC

## 2013-02-19 MED ORDER — GEMFIBROZIL 600 MG PO TABS: 600.0000 mg | ORAL_TABLET | Freq: Two times a day (BID) | ORAL | Status: DC

## 2013-02-19 MED ORDER — INSULIN NPH ISOPHANE & REGULAR (70-30) 100 UNIT/ML ~~LOC~~ SUSP: 12.0000 [IU] | Freq: Two times a day (BID) | SUBCUTANEOUS | Status: DC

## 2013-02-19 NOTE — Progress Notes (Unsigned)
Patient ID: Caleb [name], male   DOB: [birth year], 62 y.o.   MRN: [id number]  Patient Demographics  Caleb [name], is a 62 y.o. male  [id number]  [id number]  DOB - 12-07-1950  Chief Complaint  Patient presents with  . Follow-up        Subjective:   Caleb [name] with History of type 2 diabetes mellitus, dyslipidemia, hypertriglyceridemia and used acute pancreatitis, recent right knee surgery due to meniscal tear  is here for followup and to establish care, he ran out of his insulin and cholesterol medications few weeks ago along with her blood pressure medications due to financial problems. He has no subjective complaints  Denies any subjective complaints except as above, no active headache, no chest abdominal pain at this time, not short of breath. No focal weakness which is new.    Objective:    Patient Active Problem List   Diagnosis Date Noted  . Pancreatitis, acute 09/27/2012  . Dyslipidemia 09/27/2012  . Erectile dysfunction 09/27/2012  . Unspecified essential hypertension 09/27/2012  . Obesity, unspecified 08/14/2012  . Hyponatremia 08/14/2012  . Acute pancreatitis 08/12/2012  . Diabetes mellitus 08/12/2012  . Hypertension 08/12/2012  . Hyperlipidemia 08/12/2012  . Metabolic acidosis 08/12/2012     Filed Vitals:   02/19/13 1455  BP: 163/119  Pulse: 96  Temp: 98.8 F (37.1 C)  TempSrc: Oral  Resp: 16  Height: 5\' 7"  (1.702 m)  Weight: 193 lb (87.544 kg)  SpO2: 98%     Exam   Awake Alert, Oriented X 3, No new F.N deficits, Normal affect [place].AT,PERRAL Supple Neck,No JVD, No cervical lymphadenopathy appriciated.  Symmetrical Chest wall movement, Good air movement bilaterally, CTAB RRR,No Gallops,Rubs or new Murmurs, No Parasternal Heave +ve B.Sounds, Abd Soft, Non tender, No organomegaly appriciated, No rebound - guarding or rigidity. No Cyanosis, Clubbing or edema, No new Rash or bruise      Data Review   Lab Results  Component Value Date    WBC 14.3* 08/13/2012   HGB 14.1 08/13/2012   HCT 38.9* 08/13/2012   MCV 81.7 08/13/2012   PLT 309 08/13/2012      Chemistry      Component Value Date/Time   NA 135 09/27/2012 1554   K 4.2 09/27/2012 1554   CL 100 09/27/2012 1554   CO2 25 09/27/2012 1554   BUN 32* 09/27/2012 1554   CREATININE 1.82* 09/27/2012 1554   CREATININE 0.78 08/15/2012 0427      Component Value Date/Time   CALCIUM 9.4 09/27/2012 1554   ALKPHOS 124* 09/27/2012 1554   AST 21 09/27/2012 1554   ALT 21 09/27/2012 1554   BILITOT 0.3 09/27/2012 1554       Lab Results  Component Value Date   HGBA1C 12.6* 08/13/2012    Lab Results  Component Value Date   CHOL 231* 09/27/2012   HDL 37* 09/27/2012   LDLCALC Comment:   Not calculated due to Triglyceride >400. Suggest ordering Direct LDL (Unit Code: 46962).   Total Cholesterol/HDL Ratio:CHD Risk                        Coronary Heart Disease Risk Table                                        Men       Women  1/2 Average Risk              3.4        3.3              Average Risk              5.0        4.4           2X Average Risk              9.6        7.1           3X Average Risk             23.4       11.0 Use the calculated Patient Ratio above and the CHD Risk table  to determine the patient's CHD Risk. ATP III Classification (LDL):       < 100        mg/dL         Optimal      119 - 129     mg/dL         Near or Above Optimal      130 - 159     mg/dL         Borderline High      160 - 189     mg/dL         High       > 147        mg/dL         Very High   11/17/5619   TRIG 415* 09/27/2012   CHOLHDL 6.2 09/27/2012    Lab Results  Component Value Date   TSH 1.294 09/27/2012    Lab Results  Component Value Date   PSA 0.29 Test Methodology: Hybritech PSA 09/16/2009      Prior to Admission medications   Medication Sig Start Date End Date Taking? Authorizing Provider  atorvastatin (LIPITOR) 40 MG tablet Take 1 tablet (40 mg total) by mouth daily at 6 PM. 02/19/13  Yes  Leroy Sea, MD  metFORMIN (GLUCOPHAGE) 1000 MG tablet Take 1 tablet (1,000 mg total) by mouth 2 (two) times daily with a meal. 02/19/13  Yes Leroy Sea, MD  metoprolol (LOPRESSOR) 100 MG tablet Take 1 tablet (100 mg total) by mouth 2 (two) times daily. 02/19/13  Yes Leroy Sea, MD  sildenafil (VIAGRA) 50 MG tablet Take 2 tablets (100 mg total) by mouth daily as needed for erectile dysfunction. 09/27/12  Yes [place] Cyndie Mull, MD  amLODipine (NORVASC) 5 MG tablet Take 1 tablet (5 mg total) by mouth daily. 02/19/13   Leroy Sea, MD  gemfibrozil (LOPID) 600 MG tablet Take 1 tablet (600 mg total) by mouth 2 (two) times daily before a meal. 02/19/13   Leroy Sea, MD  glucose monitoring kit (FREESTYLE) monitoring kit 1 each by Does not apply route 4 (four) times daily - after meals and at bedtime. 1 month Diabetic Testing Supplies for QAC-QHS accuchecks. 02/19/13   Leroy Sea, MD  insulin NPH-regular (NOVOLIN 70/30) (70-30) 100 UNIT/ML injection Inject 12 Units into the skin 2 (two) times daily with a meal. 02/19/13   Leroy Sea, MD  lisinopril (PRINIVIL,ZESTRIL) 10 MG tablet Take 1 tablet (10 mg total) by mouth daily. 02/19/13   Leroy Sea, MD     Assessment & Plan    Diabetes mellitus type 2. CBG 323  in the office, is out of his insulin due to financial problems, have represcribed his Glucophage and placed him on 7030 insulin along with refilling his testing supplies. Have advised him on low carbohydrate diet and to do Accu-Cheks q. a.c. at bedtime and come back with the Accu-Chek results in a week   Hypertension. Improved control. Refill his Lopressor and lisinopril added Norvasc    Dyslipidemia with extremely high try dyslipidemia. He is out of his list of medications, placed him on combination of statin and Lopid which was supposed to be taking post hospital discharge few months ago. Will repeat lipid panel in a week to check high  triglycerides.   Social work consult has been placed to assist patient with procuring his medications     Routine health maintenance.  Refused flu shot  Had an unremarkable colonoscopy 2 years ago per patient   Leroy Sea M.D on 02/19/2013 at 3:10 PM    Patient was given clear instructions to go to ER or return to the clinic if symptoms don't improve, worsen or new problems develop. Patient verbalized understanding. Patient was told to call to get lab results if hasn't heard anything in the next week.

## 2013-02-19 NOTE — Progress Notes (Unsigned)
Pt is here following up on his HTn and diabetes.

## 2013-02-26 ENCOUNTER — Ambulatory Visit: Payer: Self-pay

## 2013-03-01 ENCOUNTER — Other Ambulatory Visit: Payer: Self-pay | Admitting: Internal Medicine

## 2013-03-01 ENCOUNTER — Ambulatory Visit: Payer: Self-pay | Attending: Internal Medicine | Admitting: Internal Medicine

## 2013-03-01 VITALS — BP 126/82 | Ht 67.0 in | Wt 191.6 lb

## 2013-03-01 DIAGNOSIS — E119 Type 2 diabetes mellitus without complications: Secondary | ICD-10-CM

## 2013-03-01 LAB — GLUCOSE, POCT (MANUAL RESULT ENTRY): POC Glucose: 135 mg/dl — AB (ref 70–99)

## 2013-03-01 MED ORDER — GABAPENTIN 300 MG PO CAPS: 100.0000 mg | ORAL_CAPSULE | Freq: Every day | ORAL | Status: DC

## 2013-03-01 MED ORDER — SILDENAFIL CITRATE 50 MG PO TABS: 100.0000 mg | ORAL_TABLET | Freq: Every day | ORAL | Status: DC | PRN

## 2013-03-01 NOTE — Progress Notes (Signed)
Pt is here for BP reading and CBG check. BP 126/82. CBG reading 135.

## 2013-03-01 NOTE — Progress Notes (Signed)
Patient ID: Caleb [name], male   DOB: 17-Jul-1950, 62 y.o.   MRN: [id number]   CC:  HPI: 62 year old male with a history of type 2 diabetes, dyslipidemia, hypertriglyceridemia, who presents for followup of his diabetes. He was recently switched to NPH 70/30. Last hemoglobin A1c on 5/26 was 12.6 He states that his CBG has been well controlled with fasting in the range of 120-140. He also complains of burning sensation in his right leg worse at night. He otherwise exercises fairly regularly he is a nonsmoker   No Known Allergies Past Medical History  Diagnosis Date  . Pancreatitis   . Diabetes mellitus without complication   . Hypertension   . Hypercholesteremia    Current Outpatient Prescriptions on File Prior to Visit  Medication Sig Dispense Refill  . amLODipine (NORVASC) 5 MG tablet Take 1 tablet (5 mg total) by mouth daily.  30 tablet  3  . atorvastatin (LIPITOR) 40 MG tablet Take 1 tablet (40 mg total) by mouth daily at 6 PM.  30 tablet  4  . gemfibrozil (LOPID) 600 MG tablet Take 1 tablet (600 mg total) by mouth 2 (two) times daily before a meal.  60 tablet  3  . glucose monitoring kit (FREESTYLE) monitoring kit 1 each by Does not apply route 4 (four) times daily - after meals and at bedtime. 1 month Diabetic Testing Supplies for QAC-QHS accuchecks.  1 each  1  . insulin NPH-regular (NOVOLIN 70/30) (70-30) 100 UNIT/ML injection Inject 12 Units into the skin 2 (two) times daily with a meal.  10 mL  12  . lisinopril (PRINIVIL,ZESTRIL) 10 MG tablet Take 1 tablet (10 mg total) by mouth daily.  30 tablet  3  . metFORMIN (GLUCOPHAGE) 1000 MG tablet Take 1 tablet (1,000 mg total) by mouth 2 (two) times daily with a meal.  60 tablet  4  . metoprolol (LOPRESSOR) 100 MG tablet Take 1 tablet (100 mg total) by mouth 2 (two) times daily.  60 tablet  4  . sildenafil (VIAGRA) 50 MG tablet Take 2 tablets (100 mg total) by mouth daily as needed for erectile dysfunction.  20 tablet  2   No current  facility-administered medications on file prior to visit.   Family History  Problem Relation Age of Onset  . CAD Father   . Hypertension Father    History   Social History  . Marital Status: Married    Spouse Name: N/A    Number of Children: N/A  . Years of Education: N/A   Occupational History  . Not on file.   Social History Main Topics  . Smoking status: Former Smoker    Types: Cigarettes  . Smokeless tobacco: Never Used  . Alcohol Use: No  . Drug Use: Not on file  . Sexual Activity: Not on file   Other Topics Concern  . Not on file   Social History Narrative  . No narrative on file    Review of Systems  Constitutional: Negative for fever, chills, diaphoresis, activity change, appetite change and fatigue.  HENT: Negative for ear pain, nosebleeds, congestion, facial swelling, rhinorrhea, neck pain, neck stiffness and ear discharge.   Eyes: Negative for pain, discharge, redness, itching and visual disturbance.  Respiratory: Negative for cough, choking, chest tightness, shortness of breath, wheezing and stridor.   Cardiovascular: Negative for chest pain, palpitations and leg swelling.  Gastrointestinal: Negative for abdominal distention.  Genitourinary: Negative for dysuria, urgency, frequency, hematuria, flank pain, decreased urine volume, difficulty  urinating and dyspareunia.  Musculoskeletal: Negative for back pain, joint swelling, arthralgias and gait problem.  Neurological: Negative for dizziness, tremors, seizures, syncope, facial asymmetry, speech difficulty, weakness, light-headedness, numbness and headaches.  Hematological: Negative for adenopathy. Does not bruise/bleed easily.  Psychiatric/Behavioral: Negative for hallucinations, behavioral problems, confusion, dysphoric mood, decreased concentration and agitation.    Objective:   Filed Vitals:   03/01/13 1511  BP: 126/82    Physical Exam  Constitutional: Appears well-developed and well-nourished. No  distress.  HENT: Normocephalic. External right and left ear normal. Oropharynx is clear and moist.  Eyes: Conjunctivae and EOM are normal. PERRLA, no scleral icterus.  Neck: Normal ROM. Neck supple. No JVD. No tracheal deviation. No thyromegaly.  CVS: RRR, S1/S2 +, no murmurs, no gallops, no carotid bruit.  Pulmonary: Effort and breath sounds normal, no stridor, rhonchi, wheezes, rales.  Abdominal: Soft. BS +,  no distension, tenderness, rebound or guarding.  Musculoskeletal: Normal range of motion. No edema and no tenderness.  Lymphadenopathy: No lymphadenopathy noted, cervical, inguinal. Neuro: Alert. Normal reflexes, muscle tone coordination. No cranial nerve deficit. Skin: Skin is warm and dry. No rash noted. Not diaphoretic. No erythema. No pallor.  Psychiatric: Normal mood and affect. Behavior, judgment, thought content normal.   Lab Results  Component Value Date   WBC 14.3* 08/13/2012   HGB 14.1 08/13/2012   HCT 38.9* 08/13/2012   MCV 81.7 08/13/2012   PLT 309 08/13/2012   Lab Results  Component Value Date   CREATININE 1.82* 09/27/2012   BUN 32* 09/27/2012   NA 135 09/27/2012   K 4.2 09/27/2012   CL 100 09/27/2012   CO2 25 09/27/2012    Lab Results  Component Value Date   HGBA1C 9.6 02/19/2013   Lipid Panel     Component Value Date/Time   CHOL 231* 09/27/2012 1554   TRIG 415* 09/27/2012 1554   HDL 37* 09/27/2012 1554   CHOLHDL 6.2 09/27/2012 1554   VLDL NOT CALC 09/27/2012 1554   LDLCALC Comment:   Not calculated due to Triglyceride >400. Suggest ordering Direct LDL (Unit Code: 40981).   Total Cholesterol/HDL Ratio:CHD Risk                        Coronary Heart Disease Risk Table                                        Men       Women          1/2 Average Risk              3.4        3.3              Average Risk              5.0        4.4           2X Average Risk              9.6        7.1           3X Average Risk             23.4       11.0 Use the calculated Patient Ratio above  and the CHD Risk table  to determine the patient's CHD Risk. ATP III  Classification (LDL):       < 100        mg/dL         Optimal      657 - 129     mg/dL         Near or Above Optimal      130 - 159     mg/dL         Borderline High      160 - 189     mg/dL         High       > 846        mg/dL         Very High   9/62/9528 1554       Assessment and plan:   Patient Active Problem List   Diagnosis Date Noted  . Pancreatitis, acute 09/27/2012  . Dyslipidemia 09/27/2012  . Erectile dysfunction 09/27/2012  . Unspecified essential hypertension 09/27/2012  . Obesity, unspecified 08/14/2012  . Hyponatremia 08/14/2012  . Acute pancreatitis 08/12/2012  . Diabetes mellitus 08/12/2012  . Hypertension 08/12/2012  . Hyperlipidemia 08/12/2012  . Metabolic acidosis 08/12/2012       Diabetes mellitus Continue current dose of insulin Continue Glucophage Hemoglobin A1c   Hypertension continue current medications, will check creatinine, somewhat elevated during his last lab work   Hypertriglyceridemia continue Lopid   Follow up in one month  The patient was given clear instructions to go to ER or return to medical center if symptoms don't improve, worsen or new problems develop. The patient verbalized understanding. The patient was told to call to get any lab results if not heard anything in the next week.

## 2013-04-01 ENCOUNTER — Ambulatory Visit: Payer: Self-pay | Admitting: Internal Medicine

## 2013-04-02 ENCOUNTER — Other Ambulatory Visit: Payer: Self-pay | Admitting: Internal Medicine

## 2013-04-02 ENCOUNTER — Ambulatory Visit: Payer: Self-pay | Attending: Internal Medicine

## 2013-04-02 ENCOUNTER — Ambulatory Visit: Payer: Self-pay

## 2013-04-02 DIAGNOSIS — E119 Type 2 diabetes mellitus without complications: Secondary | ICD-10-CM

## 2013-04-02 LAB — CBC WITH DIFFERENTIAL/PLATELET
Basophils Absolute: 0 10*3/uL (ref 0.0–0.1)
Basophils Relative: 0 % (ref 0–1)
Eosinophils Absolute: 0.2 10*3/uL (ref 0.0–0.7)
Eosinophils Relative: 3 % (ref 0–5)
HCT: 38.7 % — ABNORMAL LOW (ref 39.0–52.0)
Hemoglobin: 13.5 g/dL (ref 13.0–17.0)
Lymphocytes Relative: 46 % (ref 12–46)
Lymphs Abs: 2.8 10*3/uL (ref 0.7–4.0)
MCH: 28.2 pg (ref 26.0–34.0)
MCHC: 34.9 g/dL (ref 30.0–36.0)
MCV: 80.8 fL (ref 78.0–100.0)
Monocytes Absolute: 0.7 10*3/uL (ref 0.1–1.0)
Monocytes Relative: 11 % (ref 3–12)
Neutro Abs: 2.5 10*3/uL (ref 1.7–7.7)
Neutrophils Relative %: 40 % — ABNORMAL LOW (ref 43–77)
Platelets: 361 10*3/uL (ref 150–400)
RBC: 4.79 MIL/uL (ref 4.22–5.81)
RDW: 15.9 % — ABNORMAL HIGH (ref 11.5–15.5)
WBC: 6.2 10*3/uL (ref 4.0–10.5)

## 2013-04-02 LAB — POCT GLYCOSYLATED HEMOGLOBIN (HGB A1C): Hemoglobin A1C: 9.5

## 2013-04-03 LAB — COMPLETE METABOLIC PANEL WITH GFR
ALT: 15 U/L (ref 0–53)
AST: 16 U/L (ref 0–37)
Albumin: 4.3 g/dL (ref 3.5–5.2)
Alkaline Phosphatase: 146 U/L — ABNORMAL HIGH (ref 39–117)
BUN: 18 mg/dL (ref 6–23)
CO2: 28 mEq/L (ref 19–32)
Calcium: 9.3 mg/dL (ref 8.4–10.5)
Chloride: 100 mEq/L (ref 96–112)
Creat: 0.93 mg/dL (ref 0.50–1.35)
GFR, Est African American: >89 mL/min
GFR, Est Non African American: 87 mL/min
Glucose, Bld: 217 mg/dL — ABNORMAL HIGH (ref 70–99)
Potassium: 4 mEq/L (ref 3.5–5.3)
Sodium: 136 mEq/L (ref 135–145)
Total Bilirubin: 0.2 mg/dL — ABNORMAL LOW (ref 0.3–1.2)
Total Protein: 7.2 g/dL (ref 6.0–8.3)

## 2013-04-03 LAB — LIPID PANEL
Cholesterol: 153 mg/dL (ref 0–200)
HDL: 39 mg/dL — ABNORMAL LOW (ref >39)
LDL Cholesterol: 48 mg/dL (ref 0–99)
Total CHOL/HDL Ratio: 3.9 Ratio
Triglycerides: 331 mg/dL — ABNORMAL HIGH (ref <150)
VLDL: 66 mg/dL — ABNORMAL HIGH (ref 0–40)

## 2013-04-08 ENCOUNTER — Telehealth: Payer: Self-pay | Admitting: *Deleted

## 2013-04-08 NOTE — Telephone Encounter (Signed)
Message copied by Liticia Gasior, [place] R on Mon Apr 08, 2013 11:10 AM ------      Message from: Allyson Sabal MD, Ascenc[facility] Dike      Created: Thu Apr 04, 2013  3:31 PM       Notify patient of the labs are normal. Triglycerides improving. Continue current medications ------

## 2013-04-08 NOTE — Telephone Encounter (Signed)
Made a follow up appointment for pt in February.

## 2013-05-02 ENCOUNTER — Ambulatory Visit: Payer: Self-pay | Admitting: Internal Medicine

## 2013-09-16 ENCOUNTER — Other Ambulatory Visit: Payer: Self-pay | Admitting: Internal Medicine

## 2013-09-19 ENCOUNTER — Encounter: Payer: Self-pay | Admitting: Internal Medicine

## 2013-09-19 NOTE — Telephone Encounter (Signed)
error 

## 2013-09-23 ENCOUNTER — Ambulatory Visit: Payer: Self-pay | Attending: Internal Medicine

## 2013-09-23 NOTE — Progress Notes (Unsigned)
Patient here today to have formed filled out job. Form has been placed on Dr. Christa See desk for completion and review.

## 2013-10-11 ENCOUNTER — Ambulatory Visit: Payer: Self-pay | Attending: Internal Medicine | Admitting: Internal Medicine

## 2013-10-11 ENCOUNTER — Encounter: Payer: Self-pay | Admitting: Internal Medicine

## 2013-10-11 VITALS — BP 154/89 | HR 77 | Temp 97.9°F | Resp 16

## 2013-10-11 DIAGNOSIS — Z87891 Personal history of nicotine dependence: Secondary | ICD-10-CM | POA: Insufficient documentation

## 2013-10-11 DIAGNOSIS — E089 Diabetes mellitus due to underlying condition without complications: Secondary | ICD-10-CM

## 2013-10-11 DIAGNOSIS — N529 Male erectile dysfunction, unspecified: Secondary | ICD-10-CM | POA: Insufficient documentation

## 2013-10-11 DIAGNOSIS — E139 Other specified diabetes mellitus without complications: Secondary | ICD-10-CM

## 2013-10-11 DIAGNOSIS — IMO0002 Reserved for concepts with insufficient information to code with codable children: Secondary | ICD-10-CM | POA: Insufficient documentation

## 2013-10-11 DIAGNOSIS — E1165 Type 2 diabetes mellitus with hyperglycemia: Secondary | ICD-10-CM | POA: Insufficient documentation

## 2013-10-11 DIAGNOSIS — I10 Essential (primary) hypertension: Secondary | ICD-10-CM | POA: Insufficient documentation

## 2013-10-11 DIAGNOSIS — Z794 Long term (current) use of insulin: Secondary | ICD-10-CM | POA: Insufficient documentation

## 2013-10-11 DIAGNOSIS — E785 Hyperlipidemia, unspecified: Secondary | ICD-10-CM | POA: Insufficient documentation

## 2013-10-11 DIAGNOSIS — Z139 Encounter for screening, unspecified: Secondary | ICD-10-CM | POA: Insufficient documentation

## 2013-10-11 DIAGNOSIS — E118 Type 2 diabetes mellitus with unspecified complications: Principal | ICD-10-CM

## 2013-10-11 LAB — GLUCOSE, POCT (MANUAL RESULT ENTRY)
POC Glucose: 323 mg/dl — AB (ref 70–99)
POC Glucose: 407 mg/dl — AB (ref 70–99)

## 2013-10-11 LAB — POCT GLYCOSYLATED HEMOGLOBIN (HGB A1C): Hemoglobin A1C: 13.9

## 2013-10-11 MED ORDER — DAPAGLIFLOZIN PROPANEDIOL 5 MG PO TABS: 5.0000 mg | ORAL_TABLET | Freq: Every day | ORAL | Status: DC

## 2013-10-11 MED ORDER — TADALAFIL 20 MG PO TABS: 10.0000 mg | ORAL_TABLET | ORAL | Status: DC | PRN

## 2013-10-11 MED ORDER — INSULIN ASPART 100 UNIT/ML ~~LOC~~ SOLN
20.0000 [IU] | Freq: Once | SUBCUTANEOUS | Status: AC
  Administered 2013-10-11: 20 [IU] via SUBCUTANEOUS

## 2013-10-11 NOTE — Progress Notes (Signed)
MRN: [id number] Name: Caleb [name]  Sex: male Age: 63 y.o. DOB: April 11, 1950  Allergies: Review of patient's allergies indicates no known allergies.  Chief Complaint  Patient presents with  . Follow-up    HPI: Patient is 63 y.o. male who has to of diabetes hypertension hyperlipidemia erectile dysfunction comes today for followup, his blood sugar is elevated and also borderline blood pressure is high denies any headache dizziness chest and shortness of breath, he is taking NovoLog 70/3020 units twice a day, metformin 1 g twice a day, patient denies any hypoglycemic symptoms, patient is taking Viagra but is requesting switch to cialis  which as per patient he used to the past and helped better with the symptoms, patient denies any cardiac history.patient denies smoking cigarettes.  Past Medical History  Diagnosis Date  . Pancreatitis   . Diabetes mellitus without complication   . Hypertension   . Hypercholesteremia     History reviewed. No pertinent past surgical history.    Medication List       This list is accurate as of: 10/11/13  4:23 PM.  Always use your most recent med list.               amLODipine 5 MG tablet  Commonly known as:  NORVASC  TAKE 1 TABLET BY MOUTH DAILY.     atorvastatin 40 MG tablet  Commonly known as:  LIPITOR  Take 1 tablet (40 mg total) by mouth daily at 6 PM.     Dapagliflozin Propanediol 5 MG Tabs  Commonly known as:  FARXIGA  Take 5 mg by mouth daily.     gabapentin 300 MG capsule  Commonly known as:  NEURONTIN  Take 1 capsule (300 mg total) by mouth at bedtime.     gemfibrozil 600 MG tablet  Commonly known as:  LOPID  TAKE 1 TABLET BY MOUTH 2 TIMES DAILY BEFORE A MEAL.     glucose monitoring kit monitoring kit  1 each by Does not apply route 4 (four) times daily - after meals and at bedtime. 1 month Diabetic Testing Supplies for QAC-QHS accuchecks.     insulin NPH-regular Human (70-30) 100 UNIT/ML injection  Commonly known as:   NOVOLIN 70/30  Inject 12 Units into the skin 2 (two) times daily with a meal.     lisinopril 10 MG tablet  Commonly known as:  PRINIVIL,ZESTRIL  TAKE 1 TABLET BY MOUTH DAILY.     metFORMIN 1000 MG tablet  Commonly known as:  GLUCOPHAGE  Take 1 tablet (1,000 mg total) by mouth 2 (two) times daily with a meal.     metoprolol 100 MG tablet  Commonly known as:  LOPRESSOR  Take 1 tablet (100 mg total) by mouth 2 (two) times daily.     sildenafil 50 MG tablet  Commonly known as:  VIAGRA  Take 2 tablets (100 mg total) by mouth daily as needed for erectile dysfunction.     tadalafil 20 MG tablet  Commonly known as:  CIALIS  Take 0.5-1 tablets (10-20 mg total) by mouth every other day as needed for erectile dysfunction.        Meds ordered this encounter  Medications  . insulin aspart (novoLOG) injection 20 Units    Sig:   . tadalafil (CIALIS) 20 MG tablet    Sig: Take 0.5-1 tablets (10-20 mg total) by mouth every other day as needed for erectile dysfunction.    Dispense:  10 tablet    Refill:  5  .  Dapagliflozin Propanediol (FARXIGA) 5 MG TABS    Sig: Take 5 mg by mouth daily.    Dispense:  30 tablet    Refill:  3    Immunization History  Administered Date(s) Administered  . Pneumococcal Polysaccharide-23 08/15/2012    Family History  Problem Relation Age of Onset  . CAD Father   . Hypertension Father     History  Substance Use Topics  . Smoking status: Former Smoker    Types: Cigarettes  . Smokeless tobacco: Never Used  . Alcohol Use: No    Review of Systems   As noted in HPI  Filed Vitals:   10/11/13 1551  BP: 154/89  Pulse: 77  Temp: 97.9 F (36.6 C)  Resp: 16    Physical Exam  Physical Exam  Constitutional: No distress.  Eyes: EOM are normal. Pupils are equal, round, and reactive to light.  Neck: Neck supple.  Cardiovascular: Normal rate and regular rhythm.   Pulmonary/Chest: No respiratory distress. He has no wheezes. He has no rales.    Musculoskeletal: He exhibits no edema.    CBC    Component Value Date/Time   WBC 6.2 04/02/2013 1632   RBC 4.79 04/02/2013 1632   HGB 13.5 04/02/2013 1632   HCT 38.7* 04/02/2013 1632   PLT 361 04/02/2013 1632   MCV 80.8 04/02/2013 1632   LYMPHSABS 2.8 04/02/2013 1632   MONOABS 0.7 04/02/2013 1632   EOSABS 0.2 04/02/2013 1632   BASOSABS 0.0 04/02/2013 1632    CMP     Component Value Date/Time   NA 136 04/02/2013 1632   K 4.0 04/02/2013 1632   CL 100 04/02/2013 1632   CO2 28 04/02/2013 1632   GLUCOSE 217* 04/02/2013 1632   BUN 18 04/02/2013 1632   CREATININE 0.93 04/02/2013 1632   CREATININE 0.78 08/15/2012 0427   CALCIUM 9.3 04/02/2013 1632   PROT 7.2 04/02/2013 1632   ALBUMIN 4.3 04/02/2013 1632   AST 16 04/02/2013 1632   ALT 15 04/02/2013 1632   ALKPHOS 146* 04/02/2013 1632   BILITOT 0.2* 04/02/2013 1632   GFRNONAA 87 04/02/2013 1632   GFRNONAA >90 08/15/2012 0427   GFRAA >89 04/02/2013 1632   GFRAA >90 08/15/2012 0427    Lab Results  Component Value Date/Time   CHOL 153 04/02/2013  4:32 PM    No components found with this basename: hga1c    Lab Results  Component Value Date/Time   AST 16 04/02/2013  4:32 PM    Assessment and Plan  Diabetes mellitus due to underlying condition without complications - Plan:  Results for orders placed in visit on 10/11/13  GLUCOSE, POCT (MANUAL RESULT ENTRY)      Result Value Ref Range   POC Glucose 407 (*) 70 - 99 mg/dl  POCT GLYCOSYLATED HEMOGLOBIN (HGB A1C)      Result Value Ref Range   Hemoglobin A1C 13.9    GLUCOSE, POCT (MANUAL RESULT ENTRY)      Result Value Ref Range   POC Glucose 323 (*) 70 - 99 mg/dl   Diabetes is uncontrolled, hemoglobin A1c is trending up, patient is given insulin in the office, I have advised patient to increase the dose of Novolin 70/30-15 units twice a day continue with metformin 1 g twice a day, advise for diabetes meal planning I have started patient on FARXIGA, patient will keep the fingerstick log and come  back in 4 weeks for CBG check nurses visit , insulin aspart (novoLOG) injection 20 Units, COMPLETE  METABOLIC PANEL WITH GFR, Dapagliflozin Propanediol (FARXIGA) 5 MG TABS  Unspecified essential hypertension Blood pressure is borderline elevated, I have advised patient for DASH diet continue with amlodipine lisinopril and metoprolol as prescribed.  Dyslipidemia - Plan: Patient is on Lipitor and Lopid will recheck fasting Lipid panel  Erectile dysfunction, unspecified erectile dysfunction type - Plan: Prescribed tadalafil (CIALIS) 20 MG tablet  Screening - Plan: Vit D  25 hydroxy (rtn osteoporosis monitoring), CBC with Differential   Return in about 3 months (around 01/11/2014) for diabetes, hypertension, BP and CBG check in 4 weeks/Nurse Visit.  Lorayne Marek, MD

## 2013-10-11 NOTE — Patient Instructions (Signed)
DASH Eating Plan DASH stands for "Dietary Approaches to Stop Hypertension." The DASH eating plan is a healthy eating plan that has been shown to reduce high blood pressure (hypertension). Additional health benefits may include reducing the risk of type 2 diabetes mellitus, heart disease, and stroke. The DASH eating plan may also help with weight loss. WHAT DO I NEED TO KNOW ABOUT THE DASH EATING PLAN? For the DASH eating plan, you will follow these general guidelines:  Choose foods with a percent daily value for sodium of less than 5% (as listed on the food label).  Use salt-free seasonings or herbs instead of table salt or sea salt.  Check with your health care provider or pharmacist before using salt substitutes.  Eat lower-sodium products, often labeled as "lower sodium" or "no salt added."  Eat fresh foods.  Eat more vegetables, fruits, and low-fat dairy products.  Choose whole grains. Look for the word "whole" as the first word in the ingredient list.  Choose fish and skinless chicken or turkey more often than red meat. Limit fish, poultry, and meat to 6 oz (170 g) each day.  Limit sweets, desserts, sugars, and sugary drinks.  Choose heart-healthy fats.  Limit cheese to 1 oz (28 g) per day.  Eat more home-cooked food and less restaurant, buffet, and fast food.  Limit fried foods.  Cook foods using methods other than frying.  Limit canned vegetables. If you do use them, rinse them well to decrease the sodium.  When eating at a restaurant, ask that your food be prepared with less salt, or no salt if possible. WHAT FOODS CAN I EAT? Seek help from a dietitian for individual calorie needs. Grains Whole grain or whole wheat bread. Brown rice. Whole grain or whole wheat pasta. Quinoa, bulgur, and whole grain cereals. Low-sodium cereals. Corn or whole wheat flour tortillas. Whole grain cornbread. Whole grain crackers. Low-sodium crackers. Vegetables Fresh or frozen vegetables  (raw, steamed, roasted, or grilled). Low-sodium or reduced-sodium tomato and vegetable juices. Low-sodium or reduced-sodium tomato sauce and paste. Low-sodium or reduced-sodium canned vegetables.  Fruits All fresh, canned (in natural juice), or frozen fruits. Meat and Other Protein Products Ground beef (85% or leaner), grass-fed beef, or beef trimmed of fat. Skinless chicken or turkey. Ground chicken or turkey. Pork trimmed of fat. All fish and seafood. Eggs. Dried beans, peas, or lentils. Unsalted nuts and seeds. Unsalted canned beans. Dairy Low-fat dairy products, such as skim or 1% milk, 2% or reduced-fat cheeses, low-fat ricotta or cottage cheese, or plain low-fat yogurt. Low-sodium or reduced-sodium cheeses. Fats and Oils Tub margarines without trans fats. Light or reduced-fat mayonnaise and salad dressings (reduced sodium). Avocado. Safflower, olive, or canola oils. Natural peanut or almond butter. Other Unsalted popcorn and pretzels. The items listed above may not be a complete list of recommended foods or beverages. Contact your dietitian for more options. WHAT FOODS ARE NOT RECOMMENDED? Grains White bread. White pasta. White rice. Refined cornbread. Bagels and croissants. Crackers that contain trans fat. Vegetables Creamed or fried vegetables. Vegetables in a cheese sauce. Regular canned vegetables. Regular canned tomato sauce and paste. Regular tomato and vegetable juices. Fruits Dried fruits. Canned fruit in light or heavy syrup. Fruit juice. Meat and Other Protein Products Fatty cuts of meat. Ribs, chicken wings, bacon, sausage, bologna, salami, chitterlings, fatback, hot dogs, bratwurst, and packaged luncheon meats. Salted nuts and seeds. Canned beans with salt. Dairy Whole or 2% milk, cream, half-and-half, and cream cheese. Whole-fat or sweetened yogurt. Full-fat   cheeses or blue cheese. Nondairy creamers and whipped toppings. Processed cheese, cheese spreads, or cheese  curds. Condiments Onion and garlic salt, seasoned salt, table salt, and sea salt. Canned and packaged gravies. Worcestershire sauce. Tartar sauce. Barbecue sauce. Teriyaki sauce. Soy sauce, including reduced sodium. Steak sauce. Fish sauce. Oyster sauce. Cocktail sauce. Horseradish. Ketchup and mustard. Meat flavorings and tenderizers. Bouillon cubes. Hot sauce. Tabasco sauce. Marinades. Taco seasonings. Relishes. Fats and Oils Butter, stick margarine, lard, shortening, ghee, and bacon fat. Coconut, palm kernel, or palm oils. Regular salad dressings. Other Pickles and olives. Salted popcorn and pretzels. The items listed above may not be a complete list of foods and beverages to avoid. Contact your dietitian for more information. WHERE CAN I FIND MORE INFORMATION? National Heart, Lung, and [employer] Institute: www.nhlbi.nih.gov/health/health-topics/topics/dash/ Document Released: 02/24/2011 Document Revised: 07/22/2013 Document Reviewed: 01/09/2013 Exit[employer] Patient Information 2015 [employer]. This information is not intended to replace advice given to you by your health care provider. Make sure you discuss any questions you have with your health care provider. Diabetes Mellitus and Food It is important for you to manage your blood sugar (glucose) level. Your blood glucose level can be greatly affected by what you eat. Eating healthier foods in the appropriate amounts throughout the day at about the same time each day will help you control your blood glucose level. It can also help slow or prevent worsening of your diabetes mellitus. Healthy eating may even help you improve the level of your blood pressure and reach or maintain a healthy weight.  HOW CAN FOOD AFFECT ME? Carbohydrates Carbohydrates affect your blood glucose level more than any other type of food. Your dietitian will help you determine how many carbohydrates to eat at each meal and teach you how to count carbohydrates. Counting  carbohydrates is important to keep your blood glucose at a healthy level, especially if you are using insulin or taking certain medicines for diabetes mellitus. Alcohol Alcohol can cause sudden decreases in blood glucose (hypoglycemia), especially if you use insulin or take certain medicines for diabetes mellitus. Hypoglycemia can be a life-threatening condition. Symptoms of hypoglycemia (sleepiness, dizziness, and disorientation) are similar to symptoms of having too much alcohol.  If your health care provider has given you approval to drink alcohol, do so in moderation and use the following guidelines:  Women should not have more than one drink per day, and men should not have more than two drinks per day. One drink is equal to:  12 oz of beer.  5 oz of wine.  1 oz of hard liquor.  Do not drink on an empty stomach.  Keep yourself hydrated. Have water, diet soda, or unsweetened iced tea.  Regular soda, juice, and other mixers might contain a lot of carbohydrates and should be counted. WHAT FOODS ARE NOT RECOMMENDED? As you make food choices, it is important to remember that all foods are not the same. Some foods have fewer nutrients per serving than other foods, even though they might have the same number of calories or carbohydrates. It is difficult to get your body what it needs when you eat foods with fewer nutrients. Examples of foods that you should avoid that are high in calories and carbohydrates but low in nutrients include:  Trans fats (most processed foods list trans fats on the Nutrition Facts label).  Regular soda.  Juice.  Candy.  Sweets, such as cake, pie, doughnuts, and cookies.  Fried foods. WHAT FOODS CAN I EAT? Have nutrient-rich foods,   which will nourish your body and keep you healthy. The food you should eat also will depend on several factors, including:  The calories you need.  The medicines you take.  Your weight.  Your blood glucose level.  Your  blood pressure level.  Your cholesterol level. You also should eat a variety of foods, including:  Protein, such as meat, poultry, fish, tofu, nuts, and seeds (lean animal proteins are best).  Fruits.  Vegetables.  Dairy products, such as milk, cheese, and yogurt (low fat is best).  Breads, grains, pasta, cereal, rice, and beans.  Fats such as olive oil, trans fat-free margarine, canola oil, avocado, and olives. DOES EVERYONE WITH DIABETES MELLITUS HAVE THE SAME MEAL PLAN? Because every person with diabetes mellitus is different, there is not one meal plan that works for everyone. It is very important that you meet with a dietitian who will help you create a meal plan that is just right for you. Document Released: 12/02/2004 Document Revised: 03/12/2013 Document Reviewed: 02/01/2013 Exit[employer] Patient Information 2015 [employer]. This information is not intended to replace advice given to you by your health care provider. Make sure you discuss any questions you have with your health care provider.  

## 2013-10-11 NOTE — Progress Notes (Signed)
Patient here for follow up on his diabetes Requesting a prescription for cyalis in stead of viagra Presents in office with elevated blood sugar

## 2013-11-08 ENCOUNTER — Other Ambulatory Visit: Payer: Self-pay

## 2013-12-19 ENCOUNTER — Other Ambulatory Visit: Payer: Self-pay

## 2013-12-19 MED ORDER — METFORMIN HCL 1000 MG PO TABS: 1000.0000 mg | ORAL_TABLET | Freq: Two times a day (BID) | ORAL | Status: DC

## 2013-12-19 MED ORDER — METOPROLOL TARTRATE 100 MG PO TABS: 100.0000 mg | ORAL_TABLET | Freq: Two times a day (BID) | ORAL | Status: DC

## 2013-12-19 MED ORDER — ATORVASTATIN CALCIUM 40 MG PO TABS: 40.0000 mg | ORAL_TABLET | Freq: Every day | ORAL | Status: DC

## 2014-02-06 ENCOUNTER — Ambulatory Visit: Payer: Self-pay | Attending: Internal Medicine

## 2014-03-25 ENCOUNTER — Other Ambulatory Visit: Payer: Self-pay | Admitting: Internal Medicine

## 2014-03-31 ENCOUNTER — Other Ambulatory Visit: Payer: Self-pay | Admitting: Internal Medicine

## 2014-03-31 ENCOUNTER — Other Ambulatory Visit: Payer: Self-pay

## 2014-03-31 DIAGNOSIS — E089 Diabetes mellitus due to underlying condition without complications: Secondary | ICD-10-CM

## 2014-03-31 MED ORDER — DAPAGLIFLOZIN PROPANEDIOL 5 MG PO TABS: 5.0000 mg | ORAL_TABLET | Freq: Every day | ORAL | Status: DC

## 2014-04-02 ENCOUNTER — Other Ambulatory Visit: Payer: Self-pay | Admitting: Internal Medicine

## 2014-04-04 ENCOUNTER — Ambulatory Visit: Payer: Self-pay | Attending: Internal Medicine

## 2014-04-21 ENCOUNTER — Other Ambulatory Visit: Payer: Self-pay

## 2014-04-21 MED ORDER — INSULIN NPH ISOPHANE & REGULAR (70-30) 100 UNIT/ML ~~LOC~~ SUSP: SUBCUTANEOUS | Status: DC

## 2014-04-21 MED ORDER — SILDENAFIL CITRATE 50 MG PO TABS: 100.0000 mg | ORAL_TABLET | Freq: Every day | ORAL | Status: DC | PRN

## 2014-04-24 ENCOUNTER — Ambulatory Visit: Payer: Self-pay | Admitting: Internal Medicine

## 2014-07-03 ENCOUNTER — Other Ambulatory Visit: Payer: Self-pay | Admitting: Internal Medicine

## 2014-11-26 ENCOUNTER — Other Ambulatory Visit: Payer: Self-pay | Admitting: Internal Medicine

## 2015-03-04 ENCOUNTER — Other Ambulatory Visit: Payer: Self-pay | Admitting: Internal Medicine

## 2015-03-11 ENCOUNTER — Other Ambulatory Visit: Payer: Self-pay | Admitting: Internal Medicine

## 2015-03-11 MED ORDER — SILDENAFIL CITRATE 50 MG PO TABS: 100.0000 mg | ORAL_TABLET | Freq: Every day | ORAL | Status: DC | PRN

## 2015-06-02 ENCOUNTER — Ambulatory Visit: Payer: Self-pay | Admitting: Internal Medicine

## 2015-06-09 ENCOUNTER — Ambulatory Visit: Payer: Medicare Other | Attending: Internal Medicine | Admitting: Internal Medicine

## 2015-06-09 ENCOUNTER — Encounter: Payer: Self-pay | Admitting: Internal Medicine

## 2015-06-09 VITALS — BP 150/100 | HR 110 | Temp 98.0°F | Resp 15 | Ht 67.0 in | Wt 182.8 lb

## 2015-06-09 DIAGNOSIS — Z0000 Encounter for general adult medical examination without abnormal findings: Secondary | ICD-10-CM

## 2015-06-09 DIAGNOSIS — E559 Vitamin D deficiency, unspecified: Secondary | ICD-10-CM | POA: Diagnosis not present

## 2015-06-09 DIAGNOSIS — E1165 Type 2 diabetes mellitus with hyperglycemia: Secondary | ICD-10-CM | POA: Diagnosis not present

## 2015-06-09 DIAGNOSIS — Z79899 Other long term (current) drug therapy: Secondary | ICD-10-CM | POA: Diagnosis not present

## 2015-06-09 DIAGNOSIS — E7800 Pure hypercholesterolemia, unspecified: Secondary | ICD-10-CM | POA: Insufficient documentation

## 2015-06-09 DIAGNOSIS — I10 Essential (primary) hypertension: Secondary | ICD-10-CM | POA: Diagnosis not present

## 2015-06-09 DIAGNOSIS — Z9114 Patient's other noncompliance with medication regimen: Secondary | ICD-10-CM | POA: Diagnosis not present

## 2015-06-09 DIAGNOSIS — K859 Acute pancreatitis without necrosis or infection, unspecified: Secondary | ICD-10-CM | POA: Insufficient documentation

## 2015-06-09 DIAGNOSIS — Z1211 Encounter for screening for malignant neoplasm of colon: Secondary | ICD-10-CM

## 2015-06-09 DIAGNOSIS — Z125 Encounter for screening for malignant neoplasm of prostate: Secondary | ICD-10-CM

## 2015-06-09 DIAGNOSIS — R739 Hyperglycemia, unspecified: Secondary | ICD-10-CM

## 2015-06-09 DIAGNOSIS — R7309 Other abnormal glucose: Secondary | ICD-10-CM | POA: Diagnosis not present

## 2015-06-09 DIAGNOSIS — E119 Type 2 diabetes mellitus without complications: Secondary | ICD-10-CM | POA: Diagnosis not present

## 2015-06-09 LAB — POCT URINALYSIS DIPSTICK
Bilirubin, UA: NEGATIVE
Blood, UA: NEGATIVE
Glucose, UA: 500
Leukocytes, UA: NEGATIVE
Nitrite, UA: NEGATIVE
Protein, UA: NEGATIVE
Spec Grav, UA: 1.01
Urobilinogen, UA: 0.2
pH, UA: 6

## 2015-06-09 LAB — GLUCOSE, POCT (MANUAL RESULT ENTRY)
POC Glucose: 397 mg/dl — AB (ref 70–99)
POC Glucose: 362 mg/dl — AB (ref 70–99)

## 2015-06-09 LAB — POCT GLYCOSYLATED HEMOGLOBIN (HGB A1C): Hemoglobin A1C: 12.8

## 2015-06-09 MED ORDER — METFORMIN HCL 1000 MG PO TABS: 1000.0000 mg | ORAL_TABLET | Freq: Two times a day (BID) | ORAL | Status: DC

## 2015-06-09 MED ORDER — HUMULIN 70/30 (70-30) 100 UNIT/ML ~~LOC~~ SUSP: SUBCUTANEOUS | Status: DC

## 2015-06-09 MED ORDER — LISINOPRIL 10 MG PO TABS: 10.0000 mg | ORAL_TABLET | Freq: Every day | ORAL | Status: DC

## 2015-06-09 MED ORDER — ATORVASTATIN CALCIUM 40 MG PO TABS: 40.0000 mg | ORAL_TABLET | Freq: Every day | ORAL | Status: DC

## 2015-06-09 MED ORDER — METOPROLOL TARTRATE 100 MG PO TABS: 100.0000 mg | ORAL_TABLET | Freq: Two times a day (BID) | ORAL | Status: DC

## 2015-06-09 MED ORDER — AMLODIPINE BESYLATE 5 MG PO TABS: 5.0000 mg | ORAL_TABLET | Freq: Every day | ORAL | Status: DC

## 2015-06-09 MED ORDER — INSULIN ASPART 100 UNIT/ML ~~LOC~~ SOLN
20.0000 [IU] | Freq: Once | SUBCUTANEOUS | Status: AC
Start: 2015-06-09 — End: 2015-06-09
  Administered 2015-06-09: 20 [IU] via SUBCUTANEOUS

## 2015-06-09 MED ORDER — GABAPENTIN 300 MG PO CAPS: 300.0000 mg | ORAL_CAPSULE | Freq: Two times a day (BID) | ORAL | Status: DC

## 2015-06-09 MED FILL — ?METOPROLOL 100 MG TABLET: 100 | 30 days supply | Qty: 60 | Fill #0

## 2015-06-09 MED FILL — NOVOLIN 70/30 100 UNITS/ML: (70-30) 100 | 30 days supply | Qty: 10 | Fill #0

## 2015-06-09 MED FILL — ?LISINOPRIL 10 MG TABLET: 10 | 30 days supply | Qty: 30 | Fill #0

## 2015-06-09 MED FILL — GABAPENTIN 300 MG CAPSULE: 300 | 30 days supply | Qty: 60 | Fill #0

## 2015-06-09 MED FILL — ATORVASTATIN 40 MG TABLET: 40 | 30 days supply | Qty: 30 | Fill #0

## 2015-06-09 MED FILL — metFORMIN HCL 1000 MG TABS: 1000 | 30 days supply | Qty: 60 | Fill #0

## 2015-06-09 MED FILL — ?AMLODIPINE BESYLATE 5 MG T: 5 | 30 days supply | Qty: 30 | Fill #0

## 2015-06-09 NOTE — Patient Instructions (Signed)
- fu [facility] 2 wks bp and dm chk - please schedule your eye exam.  - referral office will call you for colonoscopy appt.  Fu w/ me 3 months.   It was a pleasure meeting you.  Hypertension Hypertension, commonly called high blood pressure, is when the force of blood pumping through your arteries is too strong. Your arteries are the blood vessels that carry blood from your heart throughout your body. A blood pressure reading consists of a higher number over a lower number, such as 110/72. The higher number (systolic) is the pressure inside your arteries when your heart pumps. The lower number (diastolic) is the pressure inside your arteries when your heart relaxes. Ideally you want your blood pressure below 120/80. Hypertension forces your heart to work harder to pump blood. Your arteries may become narrow or stiff. Having untreated or uncontrolled hypertension can cause heart attack, stroke, kidney disease, and other problems. RISK FACTORS Some risk factors for high blood pressure are controllable. Others are not.  Risk factors you cannot control include:   Race. You may be at higher risk if you are African American.  Age. Risk increases with age.  Gender. Men are at higher risk than women before age 85 years. After age 19, women are at higher risk than men. Risk factors you can control include:  Not getting enough exercise or physical activity.  Being overweight.  Getting too much fat, sugar, calories, or salt in your diet.  Drinking too much alcohol. SIGNS AND SYMPTOMS Hypertension does not usually cause signs or symptoms. Extremely high blood pressure (hypertensive crisis) may cause headache, anxiety, shortness of breath, and nosebleed. DIAGNOSIS To check if you have hypertension, your health care provider will measure your blood pressure while you are seated, with your arm held at the level of your heart. It should be measured at least twice using the same arm.  Certain conditions can cause a difference in blood pressure between your right and left arms. A blood pressure reading that is higher than normal on one occasion does not mean that you need treatment. If it is not clear whether you have high blood pressure, you may be asked to return on a different day to have your blood pressure checked again. Or, you may be asked to monitor your blood pressure at home for 1 or more weeks. TREATMENT Treating high blood pressure includes making lifestyle changes and possibly taking medicine. Living a healthy lifestyle can help lower high blood pressure. You may need to change some of your habits. Lifestyle changes may include:  Following the DASH diet. This diet is high in fruits, vegetables, and whole grains. It is low in salt, red meat, and added sugars.  Keep your sodium intake below 2,300 mg per day.  Getting at least 30-45 minutes of aerobic exercise at least 4 times per week.  Losing weight if necessary.  Not smoking.  Limiting alcoholic beverages.  Learning ways to reduce stress. Your health care provider may prescribe medicine if lifestyle changes are not enough to get your blood pressure under control, and if one of the following is true:  You are 73-27 years of age and your systolic blood pressure is above 140.  You are 1 years of age or older, and your systolic blood pressure is above 150.  Your diastolic blood pressure is above 90.  You have diabetes, and your systolic blood pressure is over [id number] or your diastolic blood pressure is over 90.  You  have kidney disease and your blood pressure is above 140/90.  You have heart disease and your blood pressure is above 140/90. Your personal target blood pressure may vary depending on your medical conditions, your age, and other factors. HOME CARE INSTRUCTIONS  Have your blood pressure rechecked as directed by your health care provider.   Take medicines only as directed by your health care  provider. Follow the directions carefully. Blood pressure medicines must be taken as prescribed. The medicine does not work as well when you skip doses. Skipping doses also puts you at risk for problems.  Do not smoke.   Monitor your blood pressure at home as directed by your health care provider. SEEK MEDICAL CARE IF:   You think you are having a reaction to medicines taken.  You have recurrent headaches or feel dizzy.  You have swelling in your ankles.  You have trouble with your vision. SEEK IMMEDIATE MEDICAL CARE IF:  You develop a severe headache or confusion.  You have unusual weakness, numbness, or feel faint.  You have severe chest or abdominal pain.  You vomit repeatedly.  You have trouble breathing. MAKE SURE YOU:   Understand these instructions.  Will watch your condition.  Will get help right away if you are not doing well or get worse.   This information is not intended to replace advice given to you by your health care provider. Make sure you discuss any questions you have with your health care provider.   Document Released: 03/07/2005 Document Revised: 07/22/2014 Document Reviewed: 12/28/2012 Elsevier Interactive Patient Education 2016 [place] DASH stands for "Dietary Approaches to Stop Hypertension." The DASH eating plan is a healthy eating plan that has been shown to reduce high blood pressure (hypertension). Additional health benefits may include reducing the risk of type 2 diabetes mellitus, heart disease, and stroke. The DASH eating plan may also help with weight loss. WHAT DO I NEED TO KNOW ABOUT THE DASH EATING PLAN? For the DASH eating plan, you will follow these general guidelines:  Choose foods with a percent daily value for sodium of less than 5% (as listed on the food label).  Use salt-free seasonings or herbs instead of table salt or sea salt.  Check with your health care provider or pharmacist before using salt  substitutes.  Eat lower-sodium products, often labeled as "lower sodium" or "no salt added."  Eat fresh foods.  Eat more vegetables, fruits, and low-fat dairy products.  Choose whole grains. Look for the word "whole" as the first word in the ingredient list.  Choose fish and skinless chicken or [place] more often than red meat. Limit fish, poultry, and meat to 6 oz (170 g) each day.  Limit sweets, desserts, sugars, and sugary drinks.  Choose heart-healthy fats.  Limit cheese to 1 oz (28 g) per day.  Eat more home-cooked food and less restaurant, buffet, and fast food.  Limit fried foods.  Cook foods using methods other than frying.  Limit canned vegetables. If you do use them, rinse them well to decrease the sodium.  When eating at a restaurant, ask that your food be prepared with less salt, or no salt if possible. WHAT FOODS CAN I EAT? Seek help from a dietitian for individual calorie needs. Grains Whole grain or whole wheat bread. Brown rice. Whole grain or whole wheat pasta. Quinoa, bulgur, and whole grain cereals. Low-sodium cereals. Corn or whole wheat flour tortillas. Whole grain cornbread. Whole grain crackers.  Low-sodium crackers. Vegetables Fresh or frozen vegetables (raw, steamed, roasted, or grilled). Low-sodium or reduced-sodium tomato and vegetable juices. Low-sodium or reduced-sodium tomato sauce and paste. Low-sodium or reduced-sodium canned vegetables.  Fruits All fresh, canned (in natural juice), or frozen fruits. Meat and Other Protein Products Ground beef (85% or leaner), grass-fed beef, or beef trimmed of fat. Skinless chicken or [place]. Ground chicken or [place]. Pork trimmed of fat. All fish and seafood. Eggs. Dried beans, peas, or lentils. Unsalted nuts and seeds. Unsalted canned beans. Dairy Low-fat dairy products, such as skim or 1% milk, 2% or reduced-fat cheeses, low-fat ricotta or cottage cheese, or plain low-fat yogurt. Low-sodium or reduced-sodium  cheeses. Fats and Oils Tub margarines without trans fats. Light or reduced-fat mayonnaise and salad dressings (reduced sodium). Avocado. Safflower, olive, or canola oils. Natural peanut or almond butter. Other Unsalted popcorn and pretzels. The items listed above may not be a complete list of recommended foods or beverages. Contact your dietitian for more options. WHAT FOODS ARE NOT RECOMMENDED? Grains White bread. White pasta. White rice. Refined cornbread. Bagels and croissants. Crackers that contain trans fat. Vegetables Creamed or fried vegetables. Vegetables in a cheese sauce. Regular canned vegetables. Regular canned tomato sauce and paste. Regular tomato and vegetable juices. Fruits Dried fruits. Canned fruit in light or heavy syrup. Fruit juice. Meat and Other Protein Products Fatty cuts of meat. Ribs, chicken wings, bacon, sausage, bologna, salami, chitterlings, fatback, hot dogs, bratwurst, and packaged luncheon meats. Salted nuts and seeds. Canned beans with salt. Dairy Whole or 2% milk, cream, half-and-half, and cream cheese. Whole-fat or sweetened yogurt. Full-fat cheeses or blue cheese. Nondairy creamers and whipped toppings. Processed cheese, cheese spreads, or cheese curds. Condiments Onion and garlic salt, seasoned salt, table salt, and sea salt. Canned and packaged gravies. Worcestershire sauce. Tartar sauce. Barbecue sauce. Teriyaki sauce. Soy sauce, including reduced sodium. Steak sauce. Fish sauce. Oyster sauce. Cocktail sauce. Horseradish. Ketchup and mustard. Meat flavorings and tenderizers. Bouillon cubes. Hot sauce. Tabasco sauce. Marinades. Taco seasonings. Relishes. Fats and Oils Butter, stick margarine, lard, shortening, ghee, and bacon fat. Coconut, palm kernel, or palm oils. Regular salad dressings. Other Pickles and olives. Salted popcorn and pretzels. The items listed above may not be a complete list of foods and beverages to avoid. Contact your dietitian for  more information. WHERE CAN I FIND MORE INFORMATION? National Heart, Lung, and Blood Institute: travelstabloid.com   This information is not intended to replace advice given to you by your health care provider. Make sure you discuss any questions you have with your health care provider.   Document Released: 02/24/2011 Document Revised: 03/28/2014 Document Reviewed: 01/09/2013 Elsevier Interactive Patient Education 2016 [employer].   Diabetes Mellitus and Food It is important for you to manage your blood sugar (glucose) level. Your blood glucose level can be greatly affected by what you eat. Eating healthier foods in the appropriate amounts throughout the day at about the same time each day will help you control your blood glucose level. It can also help slow or prevent worsening of your diabetes mellitus. Healthy eating may even help you improve the level of your blood pressure and reach or maintain a healthy weight.  General recommendations for healthful eating and cooking habits include:  Eating meals and snacks regularly. Avoid going long periods of time without eating to lose weight.  Eating a diet that consists mainly of plant-based foods, such as fruits, vegetables, nuts, legumes, and whole grains.  Using low-heat cooking methods, such  as baking, instead of high-heat cooking methods, such as deep frying. Work with your dietitian to make sure you understand how to use the Nutrition Facts information on food labels. HOW CAN FOOD AFFECT ME? Carbohydrates Carbohydrates affect your blood glucose level more than any other type of food. Your dietitian will help you determine how many carbohydrates to eat at each meal and teach you how to count carbohydrates. Counting carbohydrates is important to keep your blood glucose at a healthy level, especially if you are using insulin or taking certain medicines for diabetes mellitus. Alcohol Alcohol can cause sudden  decreases in blood glucose (hypoglycemia), especially if you use insulin or take certain medicines for diabetes mellitus. Hypoglycemia can be a life-threatening condition. Symptoms of hypoglycemia (sleepiness, dizziness, and disorientation) are similar to symptoms of having too much alcohol.  If your health care provider has given you approval to drink alcohol, do so in moderation and use the following guidelines:  Women should not have more than one drink per day, and men should not have more than two drinks per day. One drink is equal to:  12 oz of beer.  5 oz of wine.  1 oz of hard liquor.  Do not drink on an empty stomach.  Keep yourself hydrated. Have water, diet soda, or unsweetened iced tea.  Regular soda, juice, and other mixers might contain a lot of carbohydrates and should be counted. WHAT FOODS ARE NOT RECOMMENDED? As you make food choices, it is important to remember that all foods are not the same. Some foods have fewer nutrients per serving than other foods, even though they might have the same number of calories or carbohydrates. It is difficult to get your body what it needs when you eat foods with fewer nutrients. Examples of foods that you should avoid that are high in calories and carbohydrates but low in nutrients include:  Trans fats (most processed foods list trans fats on the Nutrition Facts label).  Regular soda.  Juice.  Candy.  Sweets, such as cake, pie, doughnuts, and cookies.  Fried foods. WHAT FOODS CAN I EAT? Eat nutrient-rich foods, which will nourish your body and keep you healthy. The food you should eat also will depend on several factors, including:  The calories you need.  The medicines you take.  Your weight.  Your blood glucose level.  Your blood pressure level.  Your cholesterol level. You should eat a variety of foods, including:  Protein.  Lean cuts of meat.  Proteins low in saturated fats, such as fish, egg whites, and  beans. Avoid processed meats.  Fruits and vegetables.  Fruits and vegetables that may help control blood glucose levels, such as apples, mangoes, and yams.  Dairy products.  Choose fat-free or low-fat dairy products, such as milk, yogurt, and cheese.  Grains, bread, pasta, and rice.  Choose whole grain products, such as multigrain bread, whole oats, and brown rice. These foods may help control blood pressure.  Fats.  Foods containing healthful fats, such as nuts, avocado, olive oil, canola oil, and fish. DOES EVERYONE WITH DIABETES MELLITUS HAVE THE SAME MEAL PLAN? Because every person with diabetes mellitus is different, there is not one meal plan that works for everyone. It is very important that you meet with a dietitian who will help you create a meal plan that is just right for you.   This information is not intended to replace advice given to you by your health care provider. Make sure you discuss any  questions you have with your health care provider.   Document Released: 12/02/2004 Document Revised: 03/28/2014 Document Reviewed: 02/01/2013 Elsevier Interactive Patient Education [employer].

## 2015-06-09 NOTE — Progress Notes (Signed)
Out of all medications for 1 week

## 2015-06-09 NOTE — Progress Notes (Signed)
Caleb [name], is a 65 y.o. male  VFI:[id number]  [id number]  DOB - 12-11-1950  CC:  Chief Complaint  Patient presents with  . Diabetes  . Hypertension       HPI: Caleb [name] is a 65 y.o. male here today to establish medical care.  Has not been seen in our clinic since 09/2013.  Initially stating he was taking his meds with me, than eventually admitted that he has ran out of his prescriptions for a while, at least weeks.  Trying to be healthier, his work schedule is better so able to keep appts better now.  Says he takes his wife/s neurontin and it helps his neuropathy.     Patient has No headache, No chest pain, No abdominal pain - No Nausea, No new weakness tingling or numbness, No Cough - SOB.  No Known Allergies Past Medical History  Diagnosis Date  . Pancreatitis   . Diabetes mellitus without complication ([place])   . Hypertension   . Hypercholesteremia    Current Outpatient Prescriptions on File Prior to Visit  Medication Sig Dispense Refill  . dapagliflozin propanediol (FARXIGA) 5 MG TABS tablet Take 5 mg by mouth daily. (Patient not taking: Reported on 06/09/2015) 90 tablet 3  . gemfibrozil (LOPID) 600 MG tablet TAKE 1 TABLET BY MOUTH 2 TIMES DAILY BEFORE A MEAL. (Patient not taking: Reported on 06/09/2015) 60 tablet 3  . glucose monitoring kit (FREESTYLE) monitoring kit 1 each by Does not apply route 4 (four) times daily - after meals and at bedtime. 1 month Diabetic Testing Supplies for QAC-QHS accuchecks. (Patient not taking: Reported on 06/09/2015) 1 each 1  . sildenafil (VIAGRA) 50 MG tablet Take 2 tablets (100 mg total) by mouth daily as needed for erectile dysfunction. (Patient not taking: Reported on 06/09/2015) 30 tablet 3   No current facility-administered medications on file prior to visit.   Family History  Problem Relation Age of Onset  . CAD Father   . Hypertension Father    Social History   Social History  . Marital Status: Married    Spouse Name:  N/A  . Number of Children: N/A  . Years of Education: N/A   Occupational History  . Not on file.   Social History Main Topics  . Smoking status: Former Smoker    Types: Cigarettes  . Smokeless tobacco: Never Used  . Alcohol Use: No  . Drug Use: No  . Sexual Activity: Not on file   Other Topics Concern  . Not on file   Social History Narrative    Review of Systems: Constitutional: Negative for fever, chills, diaphoresis, activity change, appetite change and fatigue. HENT: Negative for ear pain, nosebleeds, congestion, facial swelling, rhinorrhea, neck pain, neck stiffness and ear discharge.  Eyes: Negative for pain, discharge, redness, itching and visual disturbance. Respiratory: Negative for cough, choking, chest tightness, shortness of breath, wheezing and stridor.  Cardiovascular: Negative for chest pain, palpitations and leg swelling. Gastrointestinal: Negative for abdominal distention. Genitourinary: Negative for dysuria, urgency, frequency, hematuria, flank pain, decreased urine volume, difficulty urinating and dyspareunia.  Musculoskeletal: Negative for back pain, joint swelling, arthralgia and gait problem. Neurological: Negative for dizziness, tremors, seizures, syncope, facial asymmetry, speech difficulty, weakness, light-headedness, numbness and headaches.  Hematological: Negative for adenopathy. Does not bruise/bleed easily. Psychiatric/Behavioral: Negative for hallucinations, behavioral problems, confusion, dysphoric mood, decreased concentration and agitation.    Objective:   Filed Vitals:   06/09/15 1559 06/09/15 1600  BP: 172/112 150/100  Pulse: 110  Temp: 98 F (36.7 C)   Resp: 15     Physical Exam: Constitutional: Patient appears well-developed and well-nourished. No distress. aaox 3, very pleasant. HENT: Normocephalic, atraumatic, External right and left ear normal. Oropharynx is clear and moist.  Eyes: Conjunctivae and EOM are normal. PERRL, no  scleral icterus. Neck: Normal ROM. Neck supple. No JVD. No tracheal deviation. No thyromegaly. CVS: RRR, S1/S2 +, no murmurs, no gallops, no carotid bruit.  Pulmonary: Effort and breath sounds normal, no stridor, rhonchi, wheezes, rales.  Abdominal: Soft. BS +, no distension, tenderness, rebound or guarding.  Musculoskeletal: Normal range of motion. No edema and no tenderness.  Lymphadenopathy: No lymphadenopathy noted, cervical. Neuro: Alert. Normal reflexes, muscle tone coordination. No cranial nerve deficit grossly. Skin: Skin is warm and dry. No rash noted. Not diaphoretic. No erythema. No pallor. Psychiatric: Normal mood and affect. Behavior, judgment, thought content normal.  Lab Results  Component Value Date   WBC 6.2 04/02/2013   HGB 13.5 04/02/2013   HCT 38.7* 04/02/2013   MCV 80.8 04/02/2013   PLT 361 04/02/2013   Lab Results  Component Value Date   CREATININE 0.93 04/02/2013   BUN 18 04/02/2013   NA 136 04/02/2013   K 4.0 04/02/2013   CL 100 04/02/2013   CO2 28 04/02/2013    Lab Results  Component Value Date   HGBA1C 12.8 06/09/2015   Lipid Panel     Component Value Date/Time   CHOL 153 04/02/2013 1632   TRIG 331* 04/02/2013 1632   HDL 39* 04/02/2013 1632   CHOLHDL 3.9 04/02/2013 1632   VLDL 66* 04/02/2013 1632   LDLCALC 48 04/02/2013 1632       Assessment and plan:   1. Type 2 diabetes mellitus with hyperglycemia, without long-term current use of insulin (HCC)   - HgB A1c 12.8 - Glucose (CBG) 397 - POCT urinalysis dipstick reviewed, trace ketones,, encourage to drink water - CBC with Differential - Basic Metabolic Panel  2. Essential hypertension, uncontrolled - renewed all his bp meds, has not taken them in at least weeks, if not more -fu [facility] 2 wks for bp/dm chk.  3. Special screening for malignant neoplasms, colon Ambulatory referral to Gastroenterology - colonoscopy, amendable to it  4. Screening for prostate  cancer Risk/benefits of testing, psa issues reviewed with Patient, but he would like to go ahead with it. - PSA, Medicare  5. Medication noncompliance, describes work conflict in schedules.  - encourage dash diet, exercise, ada diet info  6. Health care maintenance chking labs,  Vitamin D, 25-hydroxy,   - fasting lipids next visit w/ me.  Please pick up prescriptions!  Return in about 3 months (around 09/09/2015).  chking fasting lipids at that time.  The patient was given clear instructions to go to ER or return to medical center if symptoms don't improve, worsen or new problems develop. The patient verbalized understanding. The patient was told to call to get lab results if they haven't heard anything in the next week.      Maren Reamer, MD, [place], [place]   06/09/2015, 4:36 PM

## 2015-06-10 ENCOUNTER — Telehealth: Payer: Self-pay | Admitting: *Deleted

## 2015-06-10 ENCOUNTER — Other Ambulatory Visit: Payer: Self-pay | Admitting: Internal Medicine

## 2015-06-10 LAB — CBC WITH DIFFERENTIAL/PLATELET
Basophils Absolute: 0.1 10*3/uL (ref 0.0–0.1)
Basophils Relative: 1 % (ref 0–1)
Eosinophils Absolute: 0.2 10*3/uL (ref 0.0–0.7)
Eosinophils Relative: 4 % (ref 0–5)
HCT: 47.3 % (ref 39.0–52.0)
Hemoglobin: 16.3 g/dL (ref 13.0–17.0)
Lymphocytes Relative: 51 % — ABNORMAL HIGH (ref 12–46)
Lymphs Abs: 2.6 10*3/uL (ref 0.7–4.0)
MCH: 28.6 pg (ref 26.0–34.0)
MCHC: 34.5 g/dL (ref 30.0–36.0)
MCV: 83.1 fL (ref 78.0–100.0)
MPV: 10.5 fL (ref 8.6–12.4)
Monocytes Absolute: 0.5 10*3/uL (ref 0.1–1.0)
Monocytes Relative: 10 % (ref 3–12)
Neutro Abs: 1.7 10*3/uL (ref 1.7–7.7)
Neutrophils Relative %: 34 % — ABNORMAL LOW (ref 43–77)
Platelets: 272 10*3/uL (ref 150–400)
RBC: 5.69 MIL/uL (ref 4.22–5.81)
RDW: 14.5 % (ref 11.5–15.5)
WBC: 5.1 10*3/uL (ref 4.0–10.5)

## 2015-06-10 LAB — BASIC METABOLIC PANEL
BUN: 24 mg/dL (ref 7–25)
CO2: 20 mmol/L (ref 20–31)
Calcium: 9.2 mg/dL (ref 8.6–10.3)
Chloride: 95 mmol/L — ABNORMAL LOW (ref 98–110)
Creat: 1.21 mg/dL (ref 0.70–1.25)
Glucose, Bld: 324 mg/dL — ABNORMAL HIGH (ref 65–99)
Potassium: 4.1 mmol/L (ref 3.5–5.3)
Sodium: 128 mmol/L — ABNORMAL LOW (ref 135–146)

## 2015-06-10 LAB — VITAMIN D 25 HYDROXY (VIT D DEFICIENCY, FRACTURES): Vit D, 25-Hydroxy: 11 ng/mL — ABNORMAL LOW (ref 30–100)

## 2015-06-10 LAB — PSA, MEDICARE: PSA: 0.4 ng/mL (ref <=4.00)

## 2015-06-10 MED ORDER — VITAMIN D (ERGOCALCIFEROL) 1.25 MG (50000 UNIT) PO CAPS: 50000.0000 [IU] | ORAL_CAPSULE | ORAL | Status: DC

## 2015-06-10 NOTE — Telephone Encounter (Signed)
Left patient message to return clinic call.

## 2015-06-10 NOTE — Telephone Encounter (Signed)
-----   Message from Maren Reamer, MD sent at 06/10/2015  8:53 AM EDT ----- Please call patient and tell him his lab results are back.  Low vit D, can cause bone pains/muscle aches,  I have put in a prescription for him in system.  His Glucose was high, but we already knew that.  I want him to fu w/ [facility] in 2 wks so we can make sure it is improving.  His prostate lab test came back as normal.   Blood cbc all good, no anemia.  His kidney function is slightly worse than 2 years ago, likely due to out of control bp and diabetes, we will follow.  (fyi, his sodium is low b/c of the glucose, corrected sodium is normal). Thanks.

## 2015-06-12 NOTE — Telephone Encounter (Signed)
Left second message for patient to return call.

## 2015-06-12 NOTE — Telephone Encounter (Signed)
3rd attempt left message

## 2015-06-15 NOTE — Telephone Encounter (Signed)
Letter mailed

## 2015-06-16 NOTE — Telephone Encounter (Signed)
Patient called in to get results.  Verified name and date of birth.  REsults given and appointment with Erline Levine made for April 4

## 2015-06-23 ENCOUNTER — Encounter: Payer: Self-pay | Admitting: Internal Medicine

## 2015-06-23 ENCOUNTER — Ambulatory Visit: Payer: Medicare Other | Attending: Internal Medicine | Admitting: Pharmacist

## 2015-06-23 VITALS — BP 124/80 | HR 73

## 2015-06-23 DIAGNOSIS — E119 Type 2 diabetes mellitus without complications: Secondary | ICD-10-CM | POA: Diagnosis not present

## 2015-06-23 DIAGNOSIS — E559 Vitamin D deficiency, unspecified: Secondary | ICD-10-CM

## 2015-06-23 DIAGNOSIS — I10 Essential (primary) hypertension: Secondary | ICD-10-CM | POA: Diagnosis not present

## 2015-06-23 DIAGNOSIS — Z794 Long term (current) use of insulin: Secondary | ICD-10-CM | POA: Diagnosis not present

## 2015-06-23 DIAGNOSIS — E1165 Type 2 diabetes mellitus with hyperglycemia: Secondary | ICD-10-CM

## 2015-06-23 HISTORY — DX: Vitamin D deficiency, unspecified: E55.9

## 2015-06-23 MED FILL — VIT D2 1.25 MG (50,000 UNIT: 1.25 MG | 84 days supply | Qty: 12 | Fill #0

## 2015-06-23 NOTE — Patient Instructions (Signed)
Thanks for coming to see me!  Continue taking all of your medications as prescribed  Come back and see me in 1 month for blood sugar log follow up.  Basic Carbohydrate Counting for Diabetes Mellitus Carbohydrate counting is a method for keeping track of the amount of carbohydrates you eat. Eating carbohydrates naturally increases the level of sugar (glucose) in your blood, so it is important for you to know the amount that is okay for you to have in every meal. Carbohydrate counting helps keep the level of glucose in your blood within normal limits. The amount of carbohydrates allowed is different for every person. A dietitian can help you calculate the amount that is right for you. Once you know the amount of carbohydrates you can have, you can count the carbohydrates in the foods you want to eat. Carbohydrates are found in the following foods:  Grains, such as breads and cereals.  Dried beans and soy products.  Starchy vegetables, such as potatoes, peas, and corn.  Fruit and fruit juices.  Milk and yogurt.  Sweets and snack foods, such as cake, cookies, candy, chips, soft drinks, and fruit drinks. CARBOHYDRATE COUNTING There are two ways to count the carbohydrates in your food. You can use either of the methods or a combination of both. Reading the "Nutrition Facts" on [place] The "Nutrition Facts" is an area that is included on the labels of almost all packaged food and beverages in the [place]. It includes the serving size of that food or beverage and information about the nutrients in each serving of the food, including the grams (g) of carbohydrate per serving.  Decide the number of servings of this food or beverage that you will be able to eat or drink. Multiply that number of servings by the number of grams of carbohydrate that is listed on the label for that serving. The total will be the amount of carbohydrates you will be having when you eat or drink this food or  beverage. Learning Standard Serving Sizes of Food When you eat food that is not packaged or does not include "Nutrition Facts" on the label, you need to measure the servings in order to count the amount of carbohydrates.A serving of most carbohydrate-rich foods contains about 15 g of carbohydrates. The following list includes serving sizes of carbohydrate-rich foods that provide 15 g ofcarbohydrate per serving:   1 slice of bread (1 oz) or 1 six-inch tortilla.    of a hamburger bun or English muffin.  4-6 crackers.   cup unsweetened dry cereal.    cup hot cereal.   cup rice or pasta.    cup mashed potatoes or  of a large baked potato.  1 cup fresh fruit or one small piece of fruit.    cup canned or frozen fruit or fruit juice.  1 cup milk.   cup plain fat-free yogurt or yogurt sweetened with artificial sweeteners.   cup cooked dried beans or starchy vegetable, such as peas, corn, or potatoes.  Decide the number of standard-size servings that you will eat. Multiply that number of servings by 15 (the grams of carbohydrates in that serving). For example, if you eat 2 cups of strawberries, you will have eaten 2 servings and 30 g of carbohydrates (2 servings x 15 g = 30 g). For foods such as soups and casseroles, in which more than one food is mixed in, you will need to count the carbohydrates in each food that is included. EXAMPLE  OF CARBOHYDRATE COUNTING Sample Dinner  3 oz chicken breast.   cup of brown rice.   cup of corn.  1 cup milk.   1 cup strawberries with sugar-free whipped topping.  Carbohydrate Calculation Step 1: Identify the foods that contain carbohydrates:   Rice.   Corn.   Milk.   Strawberries. Step 2:Calculate the number of servings eaten of each:   2 servings of rice.   1 serving of corn.   1 serving of milk.   1 serving of strawberries. Step 3: Multiply each of those number of servings by 15 g:   2 servings of  rice x 15 g = 30 g.   1 serving of corn x 15 g = 15 g.   1 serving of milk x 15 g = 15 g.   1 serving of strawberries x 15 g = 15 g. Step 4: Add together all of the amounts to find the total grams of carbohydrates eaten: 30 g + 15 g + 15 g + 15 g = 75 g.   This information is not intended to replace advice given to you by your health care provider. Make sure you discuss any questions you have with your health care provider.   Document Released: 03/07/2005 Document Revised: 03/28/2014 Document Reviewed: 02/01/2013 Elsevier Interactive Patient Education [employer].

## 2015-06-23 NOTE — Addendum Note (Signed)
Addended by: Nicoletta Ba A on: 06/23/2015 04:19 PM   Modules accepted: Orders, Medications

## 2015-06-23 NOTE — Progress Notes (Signed)
S:    Patient arrives in good spirits.  Presents for diabetes evaluation, education, and management at the request of Dr. Janne Napoleon.  Patient was referred on 06/09/15.  Patient was last seen by Primary Care Provider on 06/09/15.   Patient reports adherence with medications. However, he reports that he missed all of his medications last night due to falling asleep early.  Current diabetes medications include: metformin 1000 mg BID, Humulin 70/30 20 units BID.  Current hypertension medications include: amlodipine 5 mg daily, metoprolol 100 mg BID, lisinopril 10 mg daily.  Patient denies hypoglycemic events.  Patient reported dietary habits: is able to determine what things will make his blood pressure (salt) and blood sugar (carbohydrates) go up. He ate [employer] for lunch today and had hot wings. Dinner is the largest meal.    O:  Lab Results  Component Value Date   HGBA1C 12.8 06/09/2015   Filed Vitals:   06/23/15 1553  BP: 124/80  Pulse: 73   Readings from the last week: Home fasting CBG: 129 - 253 (most <180) 2 hour post-prandial/random CBG:114 - 179  Home blood pressures: 129/79, 136/89, 111/79, 144/89  A/P: Diabetes longstanding currently uncontrolled based on A1c of 12.8 but improved based on home CBGs. Patient denies hypoglycemic events and is able to verbalize appropriate hypoglycemia management plan. Patient reports adherence with medication except for last night when he fell asleep before taking his medications. Control is suboptimal due to sedentary lifestyle and dietary indiscretion.  Continue metformin 1000 mg BID and Humulin 70/30 20 units BID. Don't have enough readings to really make any changes so encouraged patient to watch his carbohydrate intake and eat less fast food. Also encouraged patient to exercise to help control both blood glucose and blood pressure. Patient to continue to monitor his blood glucose at home and record it in his log book.   Next A1C anticipated  June 2017.    Hypertension longstanding currently controlled.  Patient reports adherence with medication. Continue all medications as prescribed.   Patient would like a refill on his Viagra - will defer that to Dr. Janne Napoleon.   Written patient instructions provided.  Total time in face to face counseling 20 minutes.   Follow up in Pharmacist Clinic Visit in 1 month for CBG follow up.

## 2015-07-21 MED FILL — GABAPENTIN 300 MG CAPSULE: 300 | 30 days supply | Qty: 60 | Fill #1

## 2015-07-21 MED FILL — LISINOPRIL 10 MG TABLET: 10 | 30 days supply | Qty: 30 | Fill #1

## 2015-07-21 MED FILL — ATORVASTATIN 40 MG TABLET: 40 | 30 days supply | Qty: 30 | Fill #1

## 2015-07-21 MED FILL — metFORMIN HCL 1000 MG TABS: 1000 | 30 days supply | Qty: 60 | Fill #1

## 2015-07-21 MED FILL — ?METOPROLOL 100 MG TABLET: 100 | 30 days supply | Qty: 60 | Fill #1

## 2015-07-21 MED FILL — $novoLIN 70/30 100 UNITS/ML: (70-30) 100 | 30 days supply | Qty: 10 | Fill #1

## 2015-08-03 ENCOUNTER — Other Ambulatory Visit: Payer: Self-pay | Admitting: Internal Medicine

## 2015-08-03 MED ORDER — SILDENAFIL CITRATE 100 MG PO TABS: 100.0000 mg | ORAL_TABLET | Freq: Every day | ORAL | Status: DC | PRN

## 2015-10-21 MED FILL — ?METOPROLOL 100 MG TABLET: 100 | 30 days supply | Qty: 60 | Fill #2

## 2015-10-21 MED FILL — ?ATORVASTATIN 40MG TABLET: 40 | 30 days supply | Qty: 30 | Fill #2

## 2015-10-21 MED FILL — GABAPENTIN 300 MG CAPSULE: 300 | 30 days supply | Qty: 60 | Fill #2

## 2015-10-21 MED FILL — $novoLIN 70/30 100 UNITS/ML: (70-30) 100 | 30 days supply | Qty: 10 | Fill #2

## 2015-10-21 MED FILL — LISINOPRIL 10 MG TABLET: 10 | 30 days supply | Qty: 30 | Fill #2

## 2015-10-21 MED FILL — ?METFORMIN HCL 1,000 MG TAB: 1000 | 30 days supply | Qty: 60 | Fill #2

## 2015-10-21 MED FILL — ?AMLODIPINE BESYLATE 5 MG T: 5 | 30 days supply | Qty: 30 | Fill #1

## 2016-02-18 MED FILL — metFORMIN HCL 1000 MG TABS: 1000 | 30 days supply | Qty: 60 | Fill #3

## 2016-02-18 MED FILL — METOPROLOL TARTRATE 100 MG: 100 | 30 days supply | Qty: 60 | Fill #3

## 2016-02-18 MED FILL — ?AMLODIPINE BESYLATE 5 MG T: 5 | 30 days supply | Qty: 30 | Fill #2

## 2016-02-18 MED FILL — ATORVASTATIN 40 MG TABLET: 40 | 30 days supply | Qty: 30 | Fill #3

## 2016-02-18 MED FILL — ?LISINOPRIL 10 MG TABLET: 10 | 30 days supply | Qty: 30 | Fill #3

## 2016-02-18 MED FILL — GABAPENTIN 300 MG CAPSULE: 300 | 30 days supply | Qty: 60 | Fill #3

## 2016-02-19 MED FILL — $novoLIN 70/30 100 UNITS/ML: (70-30) 100 | 30 days supply | Qty: 10 | Fill #3

## 2016-03-23 ENCOUNTER — Other Ambulatory Visit: Payer: Self-pay | Admitting: Internal Medicine

## 2016-06-06 ENCOUNTER — Encounter: Payer: Self-pay | Admitting: Internal Medicine

## 2016-06-06 ENCOUNTER — Ambulatory Visit: Payer: Medicare Other | Attending: Internal Medicine | Admitting: Internal Medicine

## 2016-06-06 VITALS — BP 159/96 | HR 98 | Temp 98.2°F | Resp 16 | Wt 175.2 lb

## 2016-06-06 DIAGNOSIS — I10 Essential (primary) hypertension: Secondary | ICD-10-CM

## 2016-06-06 DIAGNOSIS — E119 Type 2 diabetes mellitus without complications: Secondary | ICD-10-CM | POA: Diagnosis present

## 2016-06-06 DIAGNOSIS — Z794 Long term (current) use of insulin: Secondary | ICD-10-CM | POA: Diagnosis not present

## 2016-06-06 DIAGNOSIS — R946 Abnormal results of thyroid function studies: Secondary | ICD-10-CM

## 2016-06-06 DIAGNOSIS — Z1159 Encounter for screening for other viral diseases: Secondary | ICD-10-CM | POA: Diagnosis not present

## 2016-06-06 DIAGNOSIS — Z1211 Encounter for screening for malignant neoplasm of colon: Secondary | ICD-10-CM

## 2016-06-06 DIAGNOSIS — E7800 Pure hypercholesterolemia, unspecified: Secondary | ICD-10-CM | POA: Insufficient documentation

## 2016-06-06 DIAGNOSIS — R972 Elevated prostate specific antigen [PSA]: Secondary | ICD-10-CM

## 2016-06-06 DIAGNOSIS — Z23 Encounter for immunization: Secondary | ICD-10-CM

## 2016-06-06 DIAGNOSIS — E118 Type 2 diabetes mellitus with unspecified complications: Secondary | ICD-10-CM | POA: Diagnosis not present

## 2016-06-06 DIAGNOSIS — R7989 Other specified abnormal findings of blood chemistry: Secondary | ICD-10-CM

## 2016-06-06 LAB — CMP AND LIVER
ALT: 20 U/L (ref 9–46)
AST: 18 U/L (ref 10–35)
Albumin: 4.2 g/dL (ref 3.6–5.1)
Alkaline Phosphatase: 174 U/L — ABNORMAL HIGH (ref 40–115)
BUN: 23 mg/dL (ref 7–25)
Bilirubin, Direct: 0.1 mg/dL (ref <=0.2)
CO2: 26 mmol/L (ref 20–31)
Calcium: 9.3 mg/dL (ref 8.6–10.3)
Chloride: 99 mmol/L (ref 98–110)
Creat: 0.86 mg/dL (ref 0.70–1.25)
Glucose, Bld: 250 mg/dL — ABNORMAL HIGH (ref 65–99)
Indirect Bilirubin: 0.2 mg/dL (ref 0.2–1.2)
Potassium: 4.3 mmol/L (ref 3.5–5.3)
Sodium: 133 mmol/L — ABNORMAL LOW (ref 135–146)
Total Bilirubin: 0.3 mg/dL (ref 0.2–1.2)
Total Protein: 7.2 g/dL (ref 6.1–8.1)

## 2016-06-06 LAB — GLUCOSE, POCT (MANUAL RESULT ENTRY)
POC Glucose: 253 mg/dl — AB (ref 70–99)
POC Glucose: 269 mg/dl — AB (ref 70–99)

## 2016-06-06 LAB — CBC
HCT: 46.8 % (ref 38.5–50.0)
Hemoglobin: 16 g/dL (ref 13.2–17.1)
MCH: 28.9 pg (ref 27.0–33.0)
MCHC: 34.2 g/dL (ref 32.0–36.0)
MCV: 84.6 fL (ref 80.0–100.0)
MPV: 9.7 fL (ref 7.5–12.5)
Platelets: 290 10*3/uL (ref 140–400)
RBC: 5.53 MIL/uL (ref 4.20–5.80)
RDW: 14.1 % (ref 11.0–15.0)
WBC: 5.2 10*3/uL (ref 3.8–10.8)

## 2016-06-06 LAB — TSH: TSH: 0.83 mIU/L (ref 0.40–4.50)

## 2016-06-06 LAB — POCT GLYCOSYLATED HEMOGLOBIN (HGB A1C): Hemoglobin A1C: 11.1

## 2016-06-06 LAB — HEPATITIS C ANTIBODY: HCV Ab: NEGATIVE

## 2016-06-06 MED ORDER — AMLODIPINE BESYLATE 5 MG PO TABS: 5.0000 mg | ORAL_TABLET | Freq: Every day | ORAL | 3 refills | Status: DC

## 2016-06-06 MED ORDER — LISINOPRIL 10 MG PO TABS: 10.0000 mg | ORAL_TABLET | Freq: Every day | ORAL | 3 refills | Status: DC

## 2016-06-06 MED ORDER — METOPROLOL TARTRATE 100 MG PO TABS: 100.0000 mg | ORAL_TABLET | Freq: Two times a day (BID) | ORAL | 4 refills | Status: DC

## 2016-06-06 MED ORDER — ATORVASTATIN CALCIUM 40 MG PO TABS: 40.0000 mg | ORAL_TABLET | Freq: Every day | ORAL | 3 refills | Status: DC

## 2016-06-06 MED ORDER — GABAPENTIN 300 MG PO CAPS: 300.0000 mg | ORAL_CAPSULE | Freq: Three times a day (TID) | ORAL | 3 refills | Status: DC

## 2016-06-06 MED ORDER — HUMULIN 70/30 (70-30) 100 UNIT/ML ~~LOC~~ SUSP: SUBCUTANEOUS | 12 refills | Status: DC

## 2016-06-06 MED FILL — ?LISINOPRIL 10 MG TABLET: 10 | 30 days supply | Qty: 30 | Fill #0

## 2016-06-06 MED FILL — ?METOPROLOL 100 MG TABLET: 100 | 30 days supply | Qty: 60 | Fill #0

## 2016-06-06 MED FILL — GABAPENTIN 300 MG CAPSULE: 300 | 30 days supply | Qty: 90 | Fill #0

## 2016-06-06 MED FILL — ?AMLODIPINE BESYLATE 5 MG T: 5 | 30 days supply | Qty: 30 | Fill #0

## 2016-06-06 MED FILL — ?ATORVASTATIN 40MG TABLET: 40 | 30 days supply | Qty: 30 | Fill #0

## 2016-06-06 NOTE — Patient Instructions (Addendum)
2 wks fu w/ [facility] for DM and htn check.   Td Vaccine (Tetanus and Diphtheria): What You Need to Know 1. Why get vaccinated? Tetanus  and diphtheria are very serious diseases. They are rare in the [place] today, but people who do become infected often have severe complications. Td vaccine is used to protect adolescents and adults from both of these diseases. Both tetanus and diphtheria are infections caused by bacteria. Diphtheria spreads from person to person through coughing or sneezing. Tetanus-causing bacteria enter the body through cuts, scratches, or wounds. TETANUS (lockjaw) causes painful muscle tightening and stiffness, usually all over the body.  It can lead to tightening of muscles in the head and neck so you can't open your mouth, swallow, or sometimes even breathe. Tetanus kills about 1 out of every 10 people who are infected even after receiving the best medical care. DIPHTHERIA can cause a thick coating to form in the back of the throat.  It can lead to breathing problems, paralysis, heart failure, and death. Before vaccines, as many as 200,000 cases of diphtheria and hundreds of cases of tetanus were reported in the [place] each year. Since vaccination began, reports of cases for both diseases have dropped by about 99%. 2. Td vaccine Td vaccine can protect adolescents and adults from tetanus and diphtheria. Td is usually given as a booster dose every 10 years but it can also be given earlier after a severe and dirty wound or burn. Another vaccine, called Tdap, which protects against pertussis in addition to tetanus and diphtheria, is sometimes recommended instead of Td vaccine. Your doctor or the person giving you the vaccine can give you more information. Td may safely be given at the same time as other vaccines. 3. Some people should not get this vaccine  A person who has ever had a life-threatening allergic reaction after a previous dose of any tetanus  or diphtheria containing vaccine, OR has a severe allergy to any part of this vaccine, should not get Td vaccine. Tell the person giving the vaccine about any severe allergies.  Talk to your doctor if you:  had severe pain or swelling after any vaccine containing diphtheria or tetanus,  ever had a condition called Guillain Barre Syndrome (GBS),  aren't feeling well on the day the shot is scheduled. 4. What are the risks from Td vaccine? With any medicine, including vaccines, there is a chance of side effects. These are usually mild and go away on their own. Serious reactions are also possible but are rare. Most people who get Td vaccine do not have any problems with it. Mild problems following Td vaccine:  (Did not interfere with activities)  Pain where the shot was given (about 8 people in 10)  Redness or swelling where the shot was given (about 1 person in 4)  Mild fever (rare)  Headache (about 1 person in 4)  Tiredness (about 1 person in 4) Moderate problems following Td vaccine:  (Interfered with activities, but did not require medical attention)  Fever over 102F (rare) Severe problems following Td vaccine:  (Unable to perform usual activities; required medical attention)  Swelling, severe pain, bleeding and/or redness in the arm where the shot was given (rare). Problems that could happen after any vaccine:   People sometimes faint after a medical procedure, including vaccination. Sitting or lying down for about 15 minutes can help prevent fainting, and injuries caused by a fall. Tell your doctor if you feel dizzy,  or have vision changes or ringing in the ears.  Some people get severe pain in the shoulder and have difficulty moving the arm where a shot was given. This happens very rarely.  Any medication can cause a severe allergic reaction. Such reactions from a vaccine are very rare, estimated at fewer than 1 in a million doses, and would happen within a few minutes to a  few hours after the vaccination. As with any medicine, there is a very remote chance of a vaccine causing a serious injury or death. The safety of vaccines is always being monitored. For more information, visit: http://www.aguilar.org/ 5. What if there is a serious reaction? What should I look for?  Look for anything that concerns you, such as signs of a severe allergic reaction, very high fever, or unusual behavior. Signs of a severe allergic reaction can include hives, swelling of the face and throat, difficulty breathing, a fast heartbeat, dizziness, and weakness. These would usually start a few minutes to a few hours after the vaccination. What should I do?   If you think it is a severe allergic reaction or other emergency that can't wait, call 9-1-1 or get the person to the nearest hospital. Otherwise, call your doctor.  Afterward, the reaction should be reported to the Vaccine Adverse Event Reporting System (VAERS). Your doctor might file this report, or you can do it yourself through the VAERS web site at www.vaers.SamedayNews.es, or by calling 2768351607.  VAERS does not give medical advice. 6. The National Vaccine Injury Compensation Program The [employer] Vaccine Injury Compensation Program (VICP) is a federal program that was created to compensate people who may have been injured by certain vaccines. Persons who believe they may have been injured by a vaccine can learn about the program and about filing a claim by calling (579)618-9132 or visiting the [facility] website at GoldCloset.com.ee. There is a time limit to file a claim for compensation. 7. How can I learn more?  Ask your doctor. He or she can give you the vaccine package insert or suggest other sources of information.  Call your local or state health department.  Contact the Centers for Disease Control and Prevention (CDC):  Call 2108546290 (1-800-CDC-INFO)  Visit CDC's website at http://hunter.com/ CDC  Td Vaccine VIS (06/30/15) This information is not intended to replace advice given to you by your health care provider. Make sure you discuss any questions you have with your health care provider. Document Released: 01/02/2006 Document Revised: 11/26/2015 Document Reviewed: 11/26/2015 Elsevier Interactive Patient Education  2017 Elsevier Inc.   -  Diabetes Mellitus and Food It is important for you to manage your blood sugar (glucose) level. Your blood glucose level can be greatly affected by what you eat. Eating healthier foods in the appropriate amounts throughout the day at about the same time each day will help you control your blood glucose level. It can also help slow or prevent worsening of your diabetes mellitus. Healthy eating may even help you improve the level of your blood pressure and reach or maintain a healthy weight. General recommendations for healthful eating and cooking habits include:  Eating meals and snacks regularly. Avoid going long periods of time without eating to lose weight.  Eating a diet that consists mainly of plant-based foods, such as fruits, vegetables, nuts, legumes, and whole grains.  Using low-heat cooking methods, such as baking, instead of high-heat cooking methods, such as deep frying. Work with your dietitian to make sure you understand how to use  the Nutrition Facts information on food labels. How can food affect me? Carbohydrates  Carbohydrates affect your blood glucose level more than any other type of food. Your dietitian will help you determine how many carbohydrates to eat at each meal and teach you how to count carbohydrates. Counting carbohydrates is important to keep your blood glucose at a healthy level, especially if you are using insulin or taking certain medicines for diabetes mellitus. Alcohol  Alcohol can cause sudden decreases in blood glucose (hypoglycemia), especially if you use insulin or take certain medicines for diabetes mellitus.  Hypoglycemia can be a life-threatening condition. Symptoms of hypoglycemia (sleepiness, dizziness, and disorientation) are similar to symptoms of having too much alcohol. If your health care provider has given you approval to drink alcohol, do so in moderation and use the following guidelines:  Women should not have more than one drink per day, and men should not have more than two drinks per day. One drink is equal to:  12 oz of beer.  5 oz of wine.  1 oz of hard liquor.  Do not drink on an empty stomach.  Keep yourself hydrated. Have water, diet soda, or unsweetened iced tea.  Regular soda, juice, and other mixers might contain a lot of carbohydrates and should be counted. What foods are not recommended? As you make food choices, it is important to remember that all foods are not the same. Some foods have fewer nutrients per serving than other foods, even though they might have the same number of calories or carbohydrates. It is difficult to get your body what it needs when you eat foods with fewer nutrients. Examples of foods that you should avoid that are high in calories and carbohydrates but low in nutrients include:  Trans fats (most processed foods list trans fats on the Nutrition Facts label).  Regular soda.  Juice.  Candy.  Sweets, such as cake, pie, doughnuts, and cookies.  Fried foods. What foods can I eat? Eat nutrient-rich foods, which will nourish your body and keep you healthy. The food you should eat also will depend on several factors, including:  The calories you need.  The medicines you take.  Your weight.  Your blood glucose level.  Your blood pressure level.  Your cholesterol level. You should eat a variety of foods, including:  Protein.  Lean cuts of meat.  Proteins low in saturated fats, such as fish, egg whites, and beans. Avoid processed meats.  Fruits and vegetables.  Fruits and vegetables that may help control blood glucose levels,  such as apples, mangoes, and yams.  Dairy products.  Choose fat-free or low-fat dairy products, such as milk, yogurt, and cheese.  Grains, bread, pasta, and rice.  Choose whole grain products, such as multigrain bread, whole oats, and brown rice. These foods may help control blood pressure.  Fats.  Foods containing healthful fats, such as nuts, avocado, olive oil, canola oil, and fish. Does everyone with diabetes mellitus have the same meal plan? Because every person with diabetes mellitus is different, there is not one meal plan that works for everyone. It is very important that you meet with a dietitian who will help you create a meal plan that is just right for you. This information is not intended to replace advice given to you by your health care provider. Make sure you discuss any questions you have with your health care provider. Document Released: 12/02/2004 Document Revised: 08/13/2015 Document Reviewed: 02/01/2013 Elsevier Interactive Patient Education  2017 Elsevier  Inc.  -   Diabetes Mellitus and Exercise Exercising regularly is important for your overall health, especially when you have diabetes (diabetes mellitus). Exercising is not only about losing weight. It has many health benefits, such as increasing muscle strength and bone density and reducing body fat and stress. This leads to improved fitness, flexibility, and endurance, all of which result in better overall health. Exercise has additional benefits for people with diabetes, including:  Reducing appetite.  Helping to lower and control blood glucose.  Lowering blood pressure.  Helping to control amounts of fatty substances (lipids) in the blood, such as cholesterol and triglycerides.  Helping the body to respond better to insulin (improving insulin sensitivity).  Reducing how much insulin the body needs.  Decreasing the risk for heart disease by:  Lowering cholesterol and triglyceride  levels.  Increasing the levels of good cholesterol.  Lowering blood glucose levels. What is my activity plan? Your health care provider or certified diabetes educator can help you make a plan for the type and frequency of exercise (activity plan) that works for you. Make sure that you:  Do at least 150 minutes of moderate-intensity or vigorous-intensity exercise each week. This could be brisk walking, biking, or water aerobics.  Do stretching and strength exercises, such as yoga or weightlifting, at least 2 times a week.  Spread out your activity over at least 3 days of the week.  Get some form of physical activity every day.  Do not go more than 2 days in a row without some kind of physical activity.  Avoid being inactive for more than 90 minutes at a time. Take frequent breaks to walk or stretch.  Choose a type of exercise or activity that you enjoy, and set realistic goals.  Start slowly, and gradually increase the intensity of your exercise over time. What do I need to know about managing my diabetes?  Check your blood glucose before and after exercising.  If your blood glucose is higher than 240 mg/dL (13.3 mmol/L) before you exercise, check your urine for ketones. If you have ketones in your urine, do not exercise until your blood glucose returns to normal.  Know the symptoms of low blood glucose (hypoglycemia) and how to treat it. Your risk for hypoglycemia increases during and after exercise. Common symptoms of hypoglycemia can include:  Hunger.  Anxiety.  Sweating and feeling clammy.  Confusion.  Dizziness or feeling light-headed.  Increased heart rate or palpitations.  Blurry vision.  Tingling or numbness around the mouth, lips, or tongue.  Tremors or shakes.  Irritability.  Keep a rapid-acting carbohydrate snack available before, during, and after exercise to help prevent or treat hypoglycemia.  Avoid injecting insulin into areas of the body that are  going to be exercised. For example, avoid injecting insulin into:  The arms, when playing tennis.  The legs, when jogging.  Keep records of your exercise habits. Doing this can help you and your health care provider adjust your diabetes management plan as needed. Write down:  Food that you eat before and after you exercise.  Blood glucose levels before and after you exercise.  The type and amount of exercise you have done.  When your insulin is expected to peak, if you use insulin. Avoid exercising at times when your insulin is peaking.  When you start a new exercise or activity, work with your health care provider to make sure the activity is safe for you, and to adjust your insulin, medicines, or food  intake as needed.  Drink plenty of water while you exercise to prevent dehydration or heat stroke. Drink enough fluid to keep your urine clear or pale yellow. This information is not intended to replace advice given to you by your health care provider. Make sure you discuss any questions you have with your health care provider. Document Released: 05/28/2003 Document Revised: 09/25/2015 Document Reviewed: 08/17/2015 Elsevier Interactive Patient Education  2017 Elsevier Inc.  -  Low-Sodium Eating Plan Sodium, which is an element that makes up salt, helps you maintain a healthy balance of fluids in your body. Too much sodium can increase your blood pressure and cause fluid and waste to be held in your body. Your health care provider or dietitian may recommend following this plan if you have high blood pressure (hypertension), kidney disease, liver disease, or heart failure. Eating less sodium can help lower your blood pressure, reduce swelling, and protect your heart, liver, and kidneys. What are tips for following this plan? General guidelines   Most people on this plan should limit their sodium intake to 1,500-2,000 mg (milligrams) of sodium each day. Reading food labels   The  Nutrition Facts label lists the amount of sodium in one serving of the food. If you eat more than one serving, you must multiply the listed amount of sodium by the number of servings.  Choose foods with less than 140 mg of sodium per serving.  Avoid foods with 300 mg of sodium or more per serving. Shopping   Look for lower-sodium products, often labeled as "low-sodium" or "no salt added."  Always check the sodium content even if foods are labeled as "unsalted" or "no salt added".  Buy fresh foods.  Avoid canned foods and premade or frozen meals.  Avoid canned, cured, or processed meats  Buy breads that have less than 80 mg of sodium per slice. Cooking   Eat more home-cooked food and less restaurant, buffet, and fast food.  Avoid adding salt when cooking. Use salt-free seasonings or herbs instead of table salt or sea salt. Check with your health care provider or pharmacist before using salt substitutes.  Cook with plant-based oils, such as canola, sunflower, or olive oil. Meal planning   When eating at a restaurant, ask that your food be prepared with less salt or no salt, if possible.  Avoid foods that contain MSG (monosodium glutamate). MSG is sometimes added to [place] food, bouillon, and some canned foods. What foods are recommended? The items listed may not be a complete list. Talk with your dietitian about what dietary choices are best for you. Grains  Low-sodium cereals, including oats, puffed wheat and rice, and shredded wheat. Low-sodium crackers. Unsalted rice. Unsalted pasta. Low-sodium bread. Whole-grain breads and whole-grain pasta. Vegetables  Fresh or frozen vegetables. "No salt added" canned vegetables. "No salt added" tomato sauce and paste. Low-sodium or reduced-sodium tomato and vegetable juice. Fruits  Fresh, frozen, or canned fruit. Fruit juice. Meats and other protein foods  Fresh or frozen (no salt added) meat, poultry, seafood, and fish. Low-sodium canned  tuna and salmon. Unsalted nuts. Dried peas, beans, and lentils without added salt. Unsalted canned beans. Eggs. Unsalted nut butters. Dairy  Milk. Soy milk. Cheese that is naturally low in sodium, such as ricotta cheese, fresh mozzarella, or Swiss cheese Low-sodium or reduced-sodium cheese. Cream cheese. Yogurt. Fats and oils  Unsalted butter. Unsalted margarine with no trans fat. Vegetable oils such as canola or olive oils. Seasonings and other foods  Fresh and  dried herbs and spices. Salt-free seasonings. Low-sodium mustard and ketchup. Sodium-free salad dressing. Sodium-free light mayonnaise. Fresh or refrigerated horseradish. Lemon juice. Vinegar. Homemade, reduced-sodium, or low-sodium soups. Unsalted popcorn and pretzels. Low-salt or salt-free chips. What foods are not recommended? The items listed may not be a complete list. Talk with your dietitian about what dietary choices are best for you. Grains  Instant hot cereals. Bread stuffing, pancake, and biscuit mixes. Croutons. Seasoned rice or pasta mixes. Noodle soup cups. Boxed or frozen macaroni and cheese. Regular salted crackers. Self-rising flour. Vegetables  Sauerkraut, pickled vegetables, and relishes. Olives. [place] fries. Onion rings. Regular canned vegetables (not low-sodium or reduced-sodium). Regular canned tomato sauce and paste (not low-sodium or reduced-sodium). Regular tomato and vegetable juice (not low-sodium or reduced-sodium). Frozen vegetables in sauces. Meats and other protein foods  Meat or fish that is salted, canned, smoked, spiced, or pickled. Bacon, ham, sausage, hotdogs, corned beef, chipped beef, packaged lunch meats, salt pork, jerky, pickled herring, anchovies, regular canned tuna, sardines, salted nuts. Dairy  Processed cheese and cheese spreads. Cheese curds. Blue cheese. Feta cheese. String cheese. Regular cottage cheese. Buttermilk. Canned milk. Fats and oils  Salted butter. Regular margarine. Ghee. Bacon  fat. Seasonings and other foods  Onion salt, garlic salt, seasoned salt, table salt, and sea salt. Canned and packaged gravies. Worcestershire sauce. Tartar sauce. Barbecue sauce. Teriyaki sauce. Soy sauce, including reduced-sodium. Steak sauce. Fish sauce. Oyster sauce. Cocktail sauce. Horseradish that you find on the shelf. Regular ketchup and mustard. Meat flavorings and tenderizers. Bouillon cubes. Hot sauce and Tabasco sauce. Premade or packaged marinades. Premade or packaged taco seasonings. Relishes. Regular salad dressings. Salsa. Potato and tortilla chips. Corn chips and puffs. Salted popcorn and pretzels. Canned or dried soups. Pizza. Frozen entrees and pot pies. Summary  Eating less sodium can help lower your blood pressure, reduce swelling, and protect your heart, liver, and kidneys.  Most people on this plan should limit their sodium intake to 1,500-2,000 mg (milligrams) of sodium each day.  Canned, boxed, and frozen foods are high in sodium. Restaurant foods, fast foods, and pizza are also very high in sodium. You also get sodium by adding salt to food.  Try to cook at home, eat more fresh fruits and vegetables, and eat less fast food, canned, processed, or prepared foods. This information is not intended to replace advice given to you by your health care provider. Make sure you discuss any questions you have with your health care provider. Document Released: 08/27/2001 Document Revised: 02/29/2016 Document Reviewed: 02/29/2016 Elsevier Interactive Patient Education  2017 Elsevier Inc.  -  Hypertension Hypertension is another name for high blood pressure. High blood pressure forces your heart to work harder to pump blood. This can cause problems over time. There are two numbers in a blood pressure reading. There is a top number (systolic) over a bottom number (diastolic). It is best to have a blood pressure below 120/80. Healthy choices can help lower your blood pressure. You may  need medicine to help lower your blood pressure if:  Your blood pressure cannot be lowered with healthy choices.  Your blood pressure is higher than 130/80. Follow these instructions at home: Eating and drinking   If directed, follow the DASH eating plan. This diet includes:  Filling half of your plate at each meal with fruits and vegetables.  Filling one quarter of your plate at each meal with whole grains. Whole grains include whole wheat pasta, brown rice, and whole grain bread.  Eating or drinking low-fat dairy products, such as skim milk or low-fat yogurt.  Filling one quarter of your plate at each meal with low-fat (lean) proteins. Low-fat proteins include fish, skinless chicken, eggs, beans, and tofu.  Avoiding fatty meat, cured and processed meat, or chicken with skin.  Avoiding premade or processed food.  Eat less than 1,500 mg of salt (sodium) a day.  Limit alcohol use to no more than 1 drink a day for nonpregnant women and 2 drinks a day for men. One drink equals 12 oz of beer, 5 oz of wine, or 1 oz of hard liquor. Lifestyle   Work with your doctor to stay at a healthy weight or to lose weight. Ask your doctor what the best weight is for you.  Get at least 30 minutes of exercise that causes your heart to beat faster (aerobic exercise) most days of the week. This may include walking, swimming, or biking.  Get at least 30 minutes of exercise that strengthens your muscles (resistance exercise) at least 3 days a week. This may include lifting weights or pilates.  Do not use any products that contain nicotine or tobacco. This includes cigarettes and e-cigarettes. If you need help quitting, ask your doctor.  Check your blood pressure at home as told by your doctor.  Keep all follow-up visits as told by your doctor. This is important. Medicines   Take over-the-counter and prescription medicines only as told by your doctor. Follow directions carefully.  Do not skip doses  of blood pressure medicine. The medicine does not work as well if you skip doses. Skipping doses also puts you at risk for problems.  Ask your doctor about side effects or reactions to medicines that you should watch for. Contact a doctor if:  You think you are having a reaction to the medicine you are taking.  You have headaches that keep coming back (recurring).  You feel dizzy.  You have swelling in your ankles.  You have trouble with your vision. Get help right away if:  You get a very bad headache.  You start to feel confused.  You feel weak or numb.  You feel faint.  You get very bad pain in your:  Chest.  Belly (abdomen).  You throw up (vomit) more than once.  You have trouble breathing. Summary  Hypertension is another name for high blood pressure.  Making healthy choices can help lower blood pressure. If your blood pressure cannot be controlled with healthy choices, you may need to take medicine. This information is not intended to replace advice given to you by your health care provider. Make sure you discuss any questions you have with your health care provider. Document Released: 08/24/2007 Document Revised: 02/03/2016 Document Reviewed: 02/03/2016 Elsevier Interactive Patient Education  2017 [employer].

## 2016-06-06 NOTE — Progress Notes (Signed)
Caleb [name], is a 66 y.o. male  [id number]  [id number]  DOB - Dec 26, 1950  Chief Complaint  Patient presents with  . Diabetes        Subjective:   Caleb [name] is a 66 y.o. male here today for a follow up visit for htn and dm, last seen in clinic 06/09/15. He states he is doing well, but ran out of all his bp meds about 2-3 wks ago, and last dose of insulin was Friday.  Not exercising much last few weeks as well, but is trying to watch his diet.  Denies tob/etoh.   Patient has No headache, No chest pain, No abdominal pain - No Nausea, No new weakness tingling or numbness, No Cough - SOB.  No problems updated.  ALLERGIES: No Known Allergies  PAST MEDICAL HISTORY: Past Medical History:  Diagnosis Date  . Diabetes mellitus without complication ([place])   . Hypercholesteremia   . Hypertension   . Pancreatitis     MEDICATIONS AT HOME: Prior to Admission medications   Medication Sig Start Date End Date Taking? Authorizing Provider  amLODipine (NORVASC) 5 MG tablet Take 1 tablet (5 mg total) by mouth daily. 06/06/16   Maren Reamer, MD  atorvastatin (LIPITOR) 40 MG tablet Take 1 tablet (40 mg total) by mouth daily at 6 PM. 06/06/16   Maren Reamer, MD  gabapentin (NEURONTIN) 300 MG capsule Take 1 capsule (300 mg total) by mouth 3 (three) times daily. 06/06/16   Maren Reamer, MD  glucose monitoring kit (FREESTYLE) monitoring kit 1 each by Does not apply route 4 (four) times daily - after meals and at bedtime. 1 month Diabetic Testing Supplies for QAC-QHS accuchecks. Patient not taking: Reported on 06/09/2015 02/19/13   Thurnell Lose, MD  HUMULIN 70/30 (70-30) 100 UNIT/ML injection INJECT 20 UNITS INTO THE SKIN 2 TIMES DAILY WITH A MEAL. 06/06/16   Maren Reamer, MD  lisinopril (PRINIVIL,ZESTRIL) 10 MG tablet Take 1 tablet (10 mg total) by mouth daily. 06/06/16   Maren Reamer, MD  metFORMIN (GLUCOPHAGE) 1000 MG tablet Take 1 tablet (1,000 mg total) by mouth 2  (two) times daily with a meal. 06/09/15   Maren Reamer, MD  metoprolol (LOPRESSOR) 100 MG tablet Take 1 tablet (100 mg total) by mouth 2 (two) times daily. 06/06/16   Maren Reamer, MD  sildenafil (VIAGRA) 100 MG tablet Take 1 tablet (100 mg total) by mouth daily as needed for erectile dysfunction. 08/03/15   Tresa Garter, MD  Vitamin D, Ergocalciferol, (DRISDOL) 50000 units CAPS capsule Take 1 capsule (50,000 Units total) by mouth every 7 (seven) days. Patient not taking: Reported on 06/06/2016 06/10/15   Maren Reamer, MD     Objective:   Vitals:   06/06/16 1424  BP: (!) 159/96  Pulse: 98  Resp: 16  Temp: 98.2 F (36.8 C)  TempSrc: Oral  SpO2: 95%  Weight: 175 lb 3.2 oz (79.5 kg)    Exam General appearance : Awake, alert, not in any distress. Speech Clear. Not toxic looking, pleasant.  HEENT: Atraumatic and Normocephalic, pupils equally reactive to light. bilat TMs clear. Neck: supple, no JVD. No carotid bruits bilat. Chest:Good air entry bilaterally, no added sounds. CVS: S1 S2 regular, no murmurs/gallups or rubs. Abdomen: Bowel sounds active, Non tender and not distended with no gaurding, rigidity or rebound. Foot exam: bilateral peripheral pulses 2+ (dorsalis pedis and post tibialis pulses), no ulcers noted/no ecchymosis, warm to touch, monofilament  testing 1/3 bilat. Sensation intact.  No c/c/e. Neurology: Awake alert, and oriented X 3, CN II-XII grossly intact, Non focal Skin:No Rash  Data Review Lab Results  Component Value Date   HGBA1C 11.1 06/06/2016   HGBA1C 12.8 06/09/2015   HGBA1C 13.9 10/11/2013    Depression screen PHQ 2/9 06/06/2016 06/09/2015 02/19/2013  Decreased Interest 0 0 0  Down, Depressed, Hopeless 0 0 0  PHQ - 2 Score 0 0 0      Assessment & Plan   1. Type 2 diabetes mellitus with complication, unspecified long term insulin use status (HCC) - out of insulin since Friday - renewed nph 70/30 20 units bid for now - POCT glucose  (manual entry) - POCT glycosylated hemoglobin (Hb A1C)  11.1 - Microalbumin/Creatinine Ratio, Urine - Ambulatory referral to Ophthalmology - CMP and Liver - CBC - 10 u novolog today for cbg 253 --repeat 269 - discussed better dm diet, had gatorade and ice-cream for lunch., dw pt long term complications persist even once dm gets controlled. Increase exercise recd! - fu [facility] pharm clinic 2 wks   2. Essential hypertension Uncontrolled, off all meds x 2- 3wks now - renewed norvasc 5, lisinopril 10 and lopressor 100bid for now - low salt diet discussed,  - fu [facility] 2 wks/ pharm clinic - for dm and bp check  3. Colon cancer screening - Ambulatory referral to Gastroenterology  4. Encounter for hepatitis C screening test for low risk patient - Hepatitis C antibody  5. Abnormal PSA, reported - PSA  6. Abnormal thyroid blood test, reported - TSH   7. tdap today  Patient have been counseled extensively about nutrition and exercise  Return in about 3 months (around 09/06/2016), or if symptoms worsen or fail to improve.  The patient was given clear instructions to go to ER or return to medical center if symptoms don't improve, worsen or new problems develop. The patient verbalized understanding. The patient was told to call to get lab results if they haven't heard anything in the next week.   This note has been created with Surveyor, quantity. Any transcriptional errors are unintentional.   Maren Reamer, MD, [facility] [place], [place]   06/06/2016, 2:56 PM

## 2016-06-07 ENCOUNTER — Encounter: Payer: Self-pay | Admitting: Internal Medicine

## 2016-06-07 LAB — MICROALBUMIN / CREATININE URINE RATIO
Creatinine, Urine: 112 mg/dL (ref 20–370)
Microalb Creat Ratio: 87 mcg/mg creat — ABNORMAL HIGH (ref <30)
Microalb, Ur: 9.7 mg/dL

## 2016-06-07 LAB — PSA: PSA: 0.4 ng/mL (ref <=4.0)

## 2016-06-08 MED FILL — NOVOLIN 70/30 100 UNITS/ML: (70-30) 100 | 25 days supply | Qty: 10 | Fill #0

## 2016-06-16 ENCOUNTER — Telehealth: Payer: Self-pay

## 2016-06-16 NOTE — Telephone Encounter (Signed)
Contacted pt to go over lab results pt didn't answer lvm asking pt to give me a call at his earliest convenience  

## 2016-06-21 ENCOUNTER — Encounter: Payer: Medicare Other | Admitting: Pharmacist

## 2016-06-23 ENCOUNTER — Telehealth: Payer: Self-pay

## 2016-06-23 NOTE — Telephone Encounter (Signed)
Called to find out if pt would like to have colonoscopy scheduled 

## 2016-07-12 ENCOUNTER — Ambulatory Visit: Payer: Medicare Other | Attending: Internal Medicine | Admitting: Pharmacist

## 2016-07-12 VITALS — BP 162/98 | HR 79

## 2016-07-12 DIAGNOSIS — Z7984 Long term (current) use of oral hypoglycemic drugs: Secondary | ICD-10-CM | POA: Insufficient documentation

## 2016-07-12 DIAGNOSIS — I10 Essential (primary) hypertension: Secondary | ICD-10-CM | POA: Insufficient documentation

## 2016-07-12 DIAGNOSIS — Z79899 Other long term (current) drug therapy: Secondary | ICD-10-CM | POA: Insufficient documentation

## 2016-07-12 DIAGNOSIS — E1165 Type 2 diabetes mellitus with hyperglycemia: Secondary | ICD-10-CM

## 2016-07-12 DIAGNOSIS — N529 Male erectile dysfunction, unspecified: Secondary | ICD-10-CM | POA: Diagnosis not present

## 2016-07-12 DIAGNOSIS — E119 Type 2 diabetes mellitus without complications: Secondary | ICD-10-CM | POA: Insufficient documentation

## 2016-07-12 DIAGNOSIS — G629 Polyneuropathy, unspecified: Secondary | ICD-10-CM | POA: Diagnosis not present

## 2016-07-12 NOTE — Patient Instructions (Addendum)
Thanks for coming to see [place] today  Decrease metoprolol to 50 mg twice daily - you should be able to split the tablets in half. We have to titrate off of this medication, you cannot just stop it or it will make your heart rate go too high too fast.  Make sure you always take your medications, even if you don't eat so your blood pressure isn't this high. High blood pressure can lead to stroke or heart attack  Come back in 1 week with blood sugar meter.

## 2016-07-12 NOTE — Progress Notes (Signed)
    S:     Chief Complaint  Patient presents with  . Medication Management    Patient arrives in good spirits.  Presents for diabetes evaluation, education, and management at the request of Dr. Janne Napoleon. Patient was referred on 06/06/16.  Patient was last seen by Primary Care Provider on 06/06/16.   Patient reports adherence with medications however he did not take his medications this morning because he had not eaten.  Current diabetes medications include: 70/30 20 units BID, metformin 1000 mg BID  Current hypertension medications include: metoprolol tartrate 100 mg BID, amlodipine 5 mg daily, lisinopril 10 mg daily.  Patient denies hypoglycemic events.  Patient reported dietary habits: Eats 3 meals/day. Drinks fruit and veggie smoothies. Had stir fry last night.   Patient reported exercise habits: walks 2 miles, plays basketball, does Probation officer.   Patient denies nocturia.  Patient reports neuropathy but it is controlled by gabapentin. Patient denies visual changes. Patient reports self foot exams.   Patient reports continued erectile dysfunction and wants to know if one of his medications may be causing it.   O:  Physical Exam   ROS   Lab Results  Component Value Date   HGBA1C 11.1 06/06/2016   BP Readings from Last 3 Encounters:  07/12/16 (!) 162/98  06/06/16 (!) 159/96  06/23/15 124/80    Home fasting CBG: none 2 hour post-prandial/random CBG: none  POCT glucose = 198 (post-prandial)   A/P: Diabetes longstanding currently UNcontrolled based on A1c of 11.1. Patient denies hypoglycemic events and is able to verbalize appropriate hypoglycemia management plan. Patient reports adherence with medication. Control is suboptimal due to dietary indiscretion.  Patient has not been able to check blood sugars at home because he did not know how to use meter. Patient now knows and will start checking. Will not adjust medications until we have readings from home but  his post-prandial in clinic today is promising. Next A1C anticipated June 2018.    Hypertension longstanding currently uncontrolled based on clinic blood pressure.  Patient reports adherence with medication. Control is suboptimal due to not taking his blood pressure medications yet today. Educated patient to take his blood pressure medications at the same time each day even if he hasn't eaten. Educated patient on increased risk of stroke and MI with elevated blood pressure. Will titrate patient down on metoprolol to 50 mg BID to see if that helps with erectile dysfunction as beta blockers are common culprits of drug-induced erectile dysfunction. If needed, there is room to titrate up on amlodipine or lisinopril but will not do until I can see what his blood pressure is on them.  Written patient instructions provided.  Total time in face to face counseling 20 minutes.   Follow up in Pharmacist Clinic Visit in 1 week for blood pressure and DM.   Patient seen with Maryan Char, PharmD Candidate

## 2016-07-19 ENCOUNTER — Ambulatory Visit: Payer: Medicare Other | Admitting: Pharmacist

## 2016-07-19 NOTE — Progress Notes (Deleted)
    S:     No chief complaint on file.   Patient arrives in good spirits.  Presents for diabetes evaluation, education, and management at the request of Dr. Janne Napoleon. Patient was referred on 06/06/16.  Patient was last seen by Primary Care Provider on 06/06/16.   Patient reports adherence with medications however he did not take his medications this morning because he had not eaten.  Current diabetes medications include: 70/30 20 units BID, metformin 1000 mg BID  Current hypertension medications include: metoprolol tartrate 100 mg BID, amlodipine 5 mg daily, lisinopril 10 mg daily.  Patient denies hypoglycemic events.  Patient reported dietary habits: Eats 3 meals/day. Drinks fruit and veggie smoothies. Had stir fry last night.   Patient reported exercise habits: walks 2 miles, plays basketball, does Probation officer.   Patient denies nocturia.  Patient reports neuropathy but it is controlled by gabapentin. Patient denies visual changes. Patient reports self foot exams.   Patient reports continued erectile dysfunction and wants to know if one of his medications may be causing it.   O:  Physical Exam   ROS   Lab Results  Component Value Date   HGBA1C 11.1 06/06/2016   BP Readings from Last 3 Encounters:  07/12/16 (!) 162/98  06/06/16 (!) 159/96  06/23/15 124/80    Home fasting CBG: none 2 hour post-prandial/random CBG: none  POCT glucose = 198 (post-prandial)   A/P: Diabetes longstanding currently UNcontrolled based on A1c of 11.1. Patient denies hypoglycemic events and is able to verbalize appropriate hypoglycemia management plan. Patient reports adherence with medication. Control is suboptimal due to dietary indiscretion.  Patient has not been able to check blood sugars at home because he did not know how to use meter. Patient now knows and will start checking. Will not adjust medications until we have readings from home but his post-prandial in clinic today is  promising. Next A1C anticipated June 2018.    Hypertension longstanding currently uncontrolled based on clinic blood pressure.  Patient reports adherence with medication. Control is suboptimal due to not taking his blood pressure medications yet today. Educated patient to take his blood pressure medications at the same time each day even if he hasn't eaten. Educated patient on increased risk of stroke and MI with elevated blood pressure. Will titrate patient down on metoprolol to 50 mg BID to see if that helps with erectile dysfunction as beta blockers are common culprits of drug-induced erectile dysfunction. If needed, there is room to titrate up on amlodipine or lisinopril but will not do until I can see what his blood pressure is on them.  Written patient instructions provided.  Total time in face to face counseling 20 minutes.   Follow up in Pharmacist Clinic Visit in 1 week for blood pressure and DM.

## 2016-08-04 ENCOUNTER — Encounter: Payer: Self-pay | Admitting: Internal Medicine

## 2016-08-05 ENCOUNTER — Encounter: Payer: Self-pay | Admitting: Internal Medicine

## 2016-08-08 ENCOUNTER — Encounter: Payer: Self-pay | Admitting: Internal Medicine

## 2016-08-09 ENCOUNTER — Encounter: Payer: Self-pay | Admitting: Internal Medicine

## 2016-08-09 DIAGNOSIS — E119 Type 2 diabetes mellitus without complications: Secondary | ICD-10-CM | POA: Diagnosis not present

## 2016-08-09 DIAGNOSIS — H2513 Age-related nuclear cataract, bilateral: Secondary | ICD-10-CM | POA: Diagnosis not present

## 2016-08-09 DIAGNOSIS — H43812 Vitreous degeneration, left eye: Secondary | ICD-10-CM | POA: Diagnosis not present

## 2016-08-09 LAB — HM DIABETES EYE EXAM

## 2016-08-24 ENCOUNTER — Other Ambulatory Visit: Payer: Self-pay | Admitting: Internal Medicine

## 2016-08-24 MED FILL — ATORVASTATIN 40 MG TABLET: 40 | 30 days supply | Qty: 30 | Fill #1

## 2016-08-24 MED FILL — GABAPENTIN 300 MG CAPSULE: 300 | 30 days supply | Qty: 90 | Fill #1

## 2016-08-24 MED FILL — ?AMLODIPINE BESYLATE 5 MG T: 5 | 30 days supply | Qty: 30 | Fill #1

## 2016-08-24 MED FILL — !NOVOLIN 70/30 100 UNITS/ML: (70-30) 100 | 25 days supply | Qty: 10 | Fill #1

## 2016-08-24 MED FILL — ?METOPROLOL 100 MG TABLET: 100 | 30 days supply | Qty: 60 | Fill #1

## 2016-08-24 MED FILL — LISINOPRIL 10 MG TABLET: 10 | 30 days supply | Qty: 30 | Fill #1

## 2016-08-25 MED FILL — !VIAGRA 100MG TABLET: 100 | 30 days supply | Qty: 2 | Fill #0

## 2016-09-08 ENCOUNTER — Ambulatory Visit: Payer: Medicare Other | Admitting: Family Medicine

## 2016-09-13 ENCOUNTER — Encounter: Payer: Self-pay | Admitting: Family Medicine

## 2016-09-13 ENCOUNTER — Ambulatory Visit: Payer: Medicare Other | Attending: Family Medicine | Admitting: Family Medicine

## 2016-09-13 VITALS — BP 127/79 | HR 61 | Temp 97.7°F | Wt 184.4 lb

## 2016-09-13 DIAGNOSIS — Z794 Long term (current) use of insulin: Secondary | ICD-10-CM | POA: Diagnosis not present

## 2016-09-13 DIAGNOSIS — E785 Hyperlipidemia, unspecified: Secondary | ICD-10-CM | POA: Diagnosis not present

## 2016-09-13 DIAGNOSIS — Z23 Encounter for immunization: Secondary | ICD-10-CM

## 2016-09-13 DIAGNOSIS — I10 Essential (primary) hypertension: Secondary | ICD-10-CM | POA: Insufficient documentation

## 2016-09-13 DIAGNOSIS — E118 Type 2 diabetes mellitus with unspecified complications: Secondary | ICD-10-CM

## 2016-09-13 DIAGNOSIS — E7800 Pure hypercholesterolemia, unspecified: Secondary | ICD-10-CM | POA: Diagnosis not present

## 2016-09-13 LAB — GLUCOSE, POCT (MANUAL RESULT ENTRY): POC Glucose: 180 mg/dl — AB (ref 70–99)

## 2016-09-13 LAB — POCT GLYCOSYLATED HEMOGLOBIN (HGB A1C): Hemoglobin A1C: 10.6

## 2016-09-13 MED ORDER — HUMULIN 70/30 (70-30) 100 UNIT/ML ~~LOC~~ SUSP: SUBCUTANEOUS | 12 refills | Status: DC

## 2016-09-13 MED FILL — !HUMULIN 70/30 VIAL: 70/30 | 20 days supply | Qty: 10 | Fill #0

## 2016-09-13 NOTE — Progress Notes (Signed)
Subjective:  Patient ID: Caleb [name], male    DOB: December 29, 1950  Age: 66 y.o. MRN: [id number]  CC: Diabetes   HPI [name] is a 66 year old male with a history of type 2 diabetes mellitus (A1c 10.6 from today), hypertension, hyperlipidemia who presents today to establish care with me. He was previously followed by Dr Janne Napoleon who is no longer with practice.  He endorses compliance with Humulin 70/30 and has been taking 20 units twice daily however he has been the major caregiver for his wife who had ovarian cancer and passed away last week. This has not allowed him much time to comply with a diabetic diet.  He takes his antihypertensive and statin with no side effects from his medications. Endorses working out every day. He has no additional concerns today.    Past Medical History:  Diagnosis Date  . Diabetes mellitus without complication ([place])   . Hypercholesteremia   . Hypertension   . Pancreatitis     No past surgical history on file.  No Known Allergies   Outpatient Medications Prior to Visit  Medication Sig Dispense Refill  . amLODipine (NORVASC) 5 MG tablet Take 1 tablet (5 mg total) by mouth daily. 90 tablet 3  . atorvastatin (LIPITOR) 40 MG tablet Take 1 tablet (40 mg total) by mouth daily at 6 PM. 90 tablet 3  . gabapentin (NEURONTIN) 300 MG capsule Take 1 capsule (300 mg total) by mouth 3 (three) times daily. 180 capsule 3  . glucose monitoring kit (FREESTYLE) monitoring kit 1 each by Does not apply route 4 (four) times daily - after meals and at bedtime. 1 month Diabetic Testing Supplies for QAC-QHS accuchecks. 1 each 1  . lisinopril (PRINIVIL,ZESTRIL) 10 MG tablet Take 1 tablet (10 mg total) by mouth daily. 90 tablet 3  . metFORMIN (GLUCOPHAGE) 1000 MG tablet Take 1 tablet (1,000 mg total) by mouth 2 (two) times daily with a meal. 120 tablet 4  . metoprolol (LOPRESSOR) 100 MG tablet Take 1 tablet (100 mg total) by mouth 2 (two) times daily. 60 tablet 4  .  HUMULIN 70/30 (70-30) 100 UNIT/ML injection INJECT 20 UNITS INTO THE SKIN 2 TIMES DAILY WITH A MEAL. 10 mL 12  . VIAGRA 100 MG tablet TAKE 1 TABLET BY MOUTH DAILY AS NEEDED FOR ERECTILE DYSFUNCTION (Patient not taking: Reported on 09/13/2016) 30 tablet 3  . Vitamin D, Ergocalciferol, (DRISDOL) 50000 units CAPS capsule Take 1 capsule (50,000 Units total) by mouth every 7 (seven) days. (Patient not taking: Reported on 09/13/2016) 12 capsule 0   No facility-administered medications prior to visit.     ROS Review of Systems  Constitutional: Negative for activity change and appetite change.  HENT: Negative for sinus pressure and sore throat.   Eyes: Negative for visual disturbance.  Respiratory: Negative for cough, chest tightness and shortness of breath.   Cardiovascular: Negative for chest pain and leg swelling.  Gastrointestinal: Negative for abdominal distention, abdominal pain, constipation and diarrhea.  Endocrine: Negative.   Genitourinary: Negative for dysuria.  Musculoskeletal: Negative for joint swelling and myalgias.  Skin: Negative for rash.  Allergic/Immunologic: Negative.   Neurological: Negative for weakness, light-headedness and numbness.  Psychiatric/Behavioral: Negative for dysphoric mood and suicidal ideas.    Objective:  BP 127/79   Pulse 61   Temp 97.7 F (36.5 C) (Oral)   Wt 184 lb 6.4 oz (83.6 kg)   SpO2 98%   BMI 28.88 kg/m   BP/Weight 09/13/2016 07/12/2016 4/74/2595  Systolic BP  794 327 614  Diastolic BP 79 98 96  Wt. (Lbs) 184.4 - 175.2  BMI 28.88 - 27.44      Physical Exam  Constitutional: He is oriented to person, place, and time. He appears well-developed and well-nourished.  HENT:  Right Ear: External ear normal.  Left Ear: External ear normal.  Mouth/Throat: Oropharynx is clear and moist.  Cardiovascular: Normal rate, normal heart sounds and intact distal pulses.   No murmur heard. Pulmonary/Chest: Effort normal and breath sounds normal. He has  no wheezes. He has no rales. He exhibits no tenderness.  Abdominal: Soft. Bowel sounds are normal. He exhibits no distension and no mass. There is no tenderness.  Musculoskeletal: Normal range of motion.  Neurological: He is alert and oriented to person, place, and time.  Skin: Skin is warm and dry.  Psychiatric: He has a normal mood and affect.     Lab Results  Component Value Date   HGBA1C 10.6 09/13/2016    Assessment & Plan:   1. Type 2 diabetes mellitus with complication, with long-term current use of insulin (HCC) Uncontrolled with A1c of 10.6 Increase Humulin 70/30 from 20 units twice daily to 25 units twice daily Keep blood sugar logs with fasting goals of 80-120 mg/dl, random of less than 180 and in the event of sugars less than 60 mg/dl or greater than 400 mg/dl please notify the clinic ASAP. It is recommended that you undergo annual eye exams and annual foot exams. Pneumovax is recommended every 5 years before the age of 71 and once for a lifetime at or after the age of 33. Prevnar 13 today - POCT glucose (manual entry) - POCT glycosylated hemoglobin (Hb A1C) - CMP14+EGFR - Lipid panel  2. Pure hypercholesterolemia Continue atorvastatin Low cholesterol diet Lifestyle modification; exercise 30 minutes on  5 days of the week.  3. Essential hypertension Uncontrolled Continue lisinopril, metoprolol, amlodipine Low-sodium diet  4. HCM Referred to a goal GI 3 months ago for colonoscopy We have provided him with the number again so he can reschedule his appointment  Meds ordered this encounter  Medications  . HUMULIN 70/30 (70-30) 100 UNIT/ML injection    Sig: INJECT 25 UNITS INTO THE SKIN 2 TIMES DAILY WITH A MEAL.    Dispense:  10 mL    Refill:  12    Discontinue previous dose    Follow-up: Return in about 3 months (around 12/14/2016) for follow up of chronic medical conditions.   Arnoldo Morale MD

## 2016-09-13 NOTE — Patient Instructions (Signed)
Diabetes Mellitus and Food It is important for you to manage your blood sugar (glucose) level. Your blood glucose level can be greatly affected by what you eat. Eating healthier foods in the appropriate amounts throughout the day at about the same time each day will help you control your blood glucose level. It can also help slow or prevent worsening of your diabetes mellitus. Healthy eating may even help you improve the level of your blood pressure and reach or maintain a healthy weight. General recommendations for healthful eating and cooking habits include:  Eating meals and snacks regularly. Avoid going long periods of time without eating to lose weight.  Eating a diet that consists mainly of plant-based foods, such as fruits, vegetables, nuts, legumes, and whole grains.  Using low-heat cooking methods, such as baking, instead of high-heat cooking methods, such as deep frying.  Work with your dietitian to make sure you understand how to use the Nutrition Facts information on food labels. How can food affect me? Carbohydrates Carbohydrates affect your blood glucose level more than any other type of food. Your dietitian will help you determine how many carbohydrates to eat at each meal and teach you how to count carbohydrates. Counting carbohydrates is important to keep your blood glucose at a healthy level, especially if you are using insulin or taking certain medicines for diabetes mellitus. Alcohol Alcohol can cause sudden decreases in blood glucose (hypoglycemia), especially if you use insulin or take certain medicines for diabetes mellitus. Hypoglycemia can be a life-threatening condition. Symptoms of hypoglycemia (sleepiness, dizziness, and disorientation) are similar to symptoms of having too much alcohol. If your health care provider has given you approval to drink alcohol, do so in moderation and use the following guidelines:  Women should not have more than one drink per day, and men  should not have more than two drinks per day. One drink is equal to: ? 12 oz of beer. ? 5 oz of wine. ? 1 oz of hard liquor.  Do not drink on an empty stomach.  Keep yourself hydrated. Have water, diet soda, or unsweetened iced tea.  Regular soda, juice, and other mixers might contain a lot of carbohydrates and should be counted.  What foods are not recommended? As you make food choices, it is important to remember that all foods are not the same. Some foods have fewer nutrients per serving than other foods, even though they might have the same number of calories or carbohydrates. It is difficult to get your body what it needs when you eat foods with fewer nutrients. Examples of foods that you should avoid that are high in calories and carbohydrates but low in nutrients include:  Trans fats (most processed foods list trans fats on the Nutrition Facts label).  Regular soda.  Juice.  Candy.  Sweets, such as cake, pie, doughnuts, and cookies.  Fried foods.  What foods can I eat? Eat nutrient-rich foods, which will nourish your body and keep you healthy. The food you should eat also will depend on several factors, including:  The calories you need.  The medicines you take.  Your weight.  Your blood glucose level.  Your blood pressure level.  Your cholesterol level.  You should eat a variety of foods, including:  Protein. ? Lean cuts of meat. ? Proteins low in saturated fats, such as fish, egg whites, and beans. Avoid processed meats.  Fruits and vegetables. ? Fruits and vegetables that may help control blood glucose levels, such as apples,   mangoes, and yams.  Dairy products. ? Choose fat-free or low-fat dairy products, such as milk, yogurt, and cheese.  Grains, bread, pasta, and rice. ? Choose whole grain products, such as multigrain bread, whole oats, and brown rice. These foods may help control blood pressure.  Fats. ? Foods containing healthful fats, such as  nuts, avocado, olive oil, canola oil, and fish.  Does everyone with diabetes mellitus have the same meal plan? Because every person with diabetes mellitus is different, there is not one meal plan that works for everyone. It is very important that you meet with a dietitian who will help you create a meal plan that is just right for you. This information is not intended to replace advice given to you by your health care provider. Make sure you discuss any questions you have with your health care provider. Document Released: 12/02/2004 Document Revised: 08/13/2015 Document Reviewed: 02/01/2013 Elsevier Interactive Patient Education  2017 [employer]  

## 2016-09-14 LAB — CMP14+EGFR
ALT: 35 IU/L (ref 0–44)
AST: 26 IU/L (ref 0–40)
Albumin/Globulin Ratio: 1.7 (ref 1.2–2.2)
Albumin: 4.3 g/dL (ref 3.6–4.8)
Alkaline Phosphatase: 169 IU/L — ABNORMAL HIGH (ref 39–117)
BUN/Creatinine Ratio: 21 (ref 10–24)
BUN: 23 mg/dL (ref 8–27)
Bilirubin Total: 0.3 mg/dL (ref 0.0–1.2)
CO2: 20 mmol/L (ref 20–29)
Calcium: 9.2 mg/dL (ref 8.6–10.2)
Chloride: 102 mmol/L (ref 96–106)
Creatinine, Ser: 1.09 mg/dL (ref 0.76–1.27)
GFR calc Af Amer: 81 mL/min/{1.73_m2} (ref >59)
GFR calc non Af Amer: 70 mL/min/{1.73_m2} (ref >59)
Globulin, Total: 2.5 g/dL (ref 1.5–4.5)
Glucose: 191 mg/dL — ABNORMAL HIGH (ref 65–99)
Potassium: 4.3 mmol/L (ref 3.5–5.2)
Sodium: 138 mmol/L (ref 134–144)
Total Protein: 6.8 g/dL (ref 6.0–8.5)

## 2016-09-14 LAB — LIPID PANEL
Chol/HDL Ratio: 2.8 ratio (ref 0.0–5.0)
Cholesterol, Total: 124 mg/dL (ref 100–199)
HDL: 45 mg/dL (ref >39)
LDL Calculated: 56 mg/dL (ref 0–99)
Triglycerides: 114 mg/dL (ref 0–149)
VLDL Cholesterol Cal: 23 mg/dL (ref 5–40)

## 2016-09-19 MED FILL — GABAPENTIN 300 MG CAPSULE: 300 | 30 days supply | Qty: 90 | Fill #2

## 2016-09-26 ENCOUNTER — Other Ambulatory Visit: Payer: Self-pay | Admitting: *Deleted

## 2016-09-26 MED ORDER — HUMULIN 70/30 (70-30) 100 UNIT/ML ~~LOC~~ SUSP: SUBCUTANEOUS | 3 refills | Status: DC

## 2016-09-26 MED ORDER — SILDENAFIL CITRATE 100 MG PO TABS: ORAL_TABLET | ORAL | 3 refills | Status: DC

## 2016-09-26 NOTE — Telephone Encounter (Signed)
PRINTED FOR PASS PROGRAM 

## 2016-10-07 ENCOUNTER — Telehealth: Payer: Self-pay | Admitting: *Deleted

## 2016-10-07 NOTE — Telephone Encounter (Signed)
Pt name and DOB verified, pt aware of results

## 2016-10-17 ENCOUNTER — Other Ambulatory Visit: Payer: Self-pay

## 2016-11-30 MED FILL — $VIAGRA 100 MG TABLET: 100 | 30 days supply | Qty: 10 | Fill #1

## 2016-11-30 MED FILL — !HUMULIN 70/30 VIAL: 70/30 | 20 days supply | Qty: 10 | Fill #1

## 2016-12-06 ENCOUNTER — Other Ambulatory Visit: Payer: Self-pay | Admitting: *Deleted

## 2016-12-06 NOTE — Telephone Encounter (Signed)
PASS APPLICATION HAS BEEN SIGNED

## 2016-12-12 ENCOUNTER — Ambulatory Visit: Payer: Medicare Other | Admitting: Family Medicine

## 2016-12-13 ENCOUNTER — Ambulatory Visit: Payer: Medicare Other | Attending: Family Medicine | Admitting: Family Medicine

## 2016-12-13 ENCOUNTER — Encounter: Payer: Self-pay | Admitting: Family Medicine

## 2016-12-13 VITALS — BP 167/87 | HR 90 | Temp 97.9°F | Wt 189.8 lb

## 2016-12-13 DIAGNOSIS — Z79899 Other long term (current) drug therapy: Secondary | ICD-10-CM | POA: Insufficient documentation

## 2016-12-13 DIAGNOSIS — E7800 Pure hypercholesterolemia, unspecified: Secondary | ICD-10-CM

## 2016-12-13 DIAGNOSIS — N529 Male erectile dysfunction, unspecified: Secondary | ICD-10-CM | POA: Diagnosis not present

## 2016-12-13 DIAGNOSIS — Z1211 Encounter for screening for malignant neoplasm of colon: Secondary | ICD-10-CM | POA: Diagnosis not present

## 2016-12-13 DIAGNOSIS — I10 Essential (primary) hypertension: Secondary | ICD-10-CM

## 2016-12-13 DIAGNOSIS — E118 Type 2 diabetes mellitus with unspecified complications: Secondary | ICD-10-CM | POA: Insufficient documentation

## 2016-12-13 DIAGNOSIS — Z9111 Patient's noncompliance with dietary regimen: Secondary | ICD-10-CM | POA: Insufficient documentation

## 2016-12-13 DIAGNOSIS — E119 Type 2 diabetes mellitus without complications: Secondary | ICD-10-CM | POA: Diagnosis present

## 2016-12-13 DIAGNOSIS — Z794 Long term (current) use of insulin: Secondary | ICD-10-CM | POA: Diagnosis not present

## 2016-12-13 LAB — POCT GLYCOSYLATED HEMOGLOBIN (HGB A1C): Hemoglobin A1C: 11.3

## 2016-12-13 LAB — GLUCOSE, POCT (MANUAL RESULT ENTRY): POC Glucose: 187 mg/dl — AB (ref 70–99)

## 2016-12-13 MED ORDER — METOPROLOL TARTRATE 100 MG PO TABS: 100.0000 mg | ORAL_TABLET | Freq: Two times a day (BID) | ORAL | 4 refills | Status: DC

## 2016-12-13 MED ORDER — HUMULIN 70/30 (70-30) 100 UNIT/ML ~~LOC~~ SUSP: SUBCUTANEOUS | 3 refills | Status: DC

## 2016-12-13 NOTE — Patient Instructions (Signed)
Diabetes Mellitus and Food It is important for you to manage your blood sugar (glucose) level. Your blood glucose level can be greatly affected by what you eat. Eating healthier foods in the appropriate amounts throughout the day at about the same time each day will help you control your blood glucose level. It can also help slow or prevent worsening of your diabetes mellitus. Healthy eating may even help you improve the level of your blood pressure and reach or maintain a healthy weight. General recommendations for healthful eating and cooking habits include:  Eating meals and snacks regularly. Avoid going long periods of time without eating to lose weight.  Eating a diet that consists mainly of plant-based foods, such as fruits, vegetables, nuts, legumes, and whole grains.  Using low-heat cooking methods, such as baking, instead of high-heat cooking methods, such as deep frying.  Work with your dietitian to make sure you understand how to use the Nutrition Facts information on food labels. How can food affect me? Carbohydrates Carbohydrates affect your blood glucose level more than any other type of food. Your dietitian will help you determine how many carbohydrates to eat at each meal and teach you how to count carbohydrates. Counting carbohydrates is important to keep your blood glucose at a healthy level, especially if you are using insulin or taking certain medicines for diabetes mellitus. Alcohol Alcohol can cause sudden decreases in blood glucose (hypoglycemia), especially if you use insulin or take certain medicines for diabetes mellitus. Hypoglycemia can be a life-threatening condition. Symptoms of hypoglycemia (sleepiness, dizziness, and disorientation) are similar to symptoms of having too much alcohol. If your health care provider has given you approval to drink alcohol, do so in moderation and use the following guidelines:  Women should not have more than one drink per day, and men  should not have more than two drinks per day. One drink is equal to: ? 12 oz of beer. ? 5 oz of wine. ? 1 oz of hard liquor.  Do not drink on an empty stomach.  Keep yourself hydrated. Have water, diet soda, or unsweetened iced tea.  Regular soda, juice, and other mixers might contain a lot of carbohydrates and should be counted.  What foods are not recommended? As you make food choices, it is important to remember that all foods are not the same. Some foods have fewer nutrients per serving than other foods, even though they might have the same number of calories or carbohydrates. It is difficult to get your body what it needs when you eat foods with fewer nutrients. Examples of foods that you should avoid that are high in calories and carbohydrates but low in nutrients include:  Trans fats (most processed foods list trans fats on the Nutrition Facts label).  Regular soda.  Juice.  Candy.  Sweets, such as cake, pie, doughnuts, and cookies.  Fried foods.  What foods can I eat? Eat nutrient-rich foods, which will nourish your body and keep you healthy. The food you should eat also will depend on several factors, including:  The calories you need.  The medicines you take.  Your weight.  Your blood glucose level.  Your blood pressure level.  Your cholesterol level.  You should eat a variety of foods, including:  Protein. ? Lean cuts of meat. ? Proteins low in saturated fats, such as fish, egg whites, and beans. Avoid processed meats.  Fruits and vegetables. ? Fruits and vegetables that may help control blood glucose levels, such as apples,   mangoes, and yams.  Dairy products. ? Choose fat-free or low-fat dairy products, such as milk, yogurt, and cheese.  Grains, bread, pasta, and rice. ? Choose whole grain products, such as multigrain bread, whole oats, and brown rice. These foods may help control blood pressure.  Fats. ? Foods containing healthful fats, such as  nuts, avocado, olive oil, canola oil, and fish.  Does everyone with diabetes mellitus have the same meal plan? Because every person with diabetes mellitus is different, there is not one meal plan that works for everyone. It is very important that you meet with a dietitian who will help you create a meal plan that is just right for you. This information is not intended to replace advice given to you by your health care provider. Make sure you discuss any questions you have with your health care provider. Document Released: 12/02/2004 Document Revised: 08/13/2015 Document Reviewed: 02/01/2013 Elsevier Interactive Patient Education  2017 [employer]  

## 2016-12-13 NOTE — Progress Notes (Signed)
Subjective:  Patient ID: Caleb [name], male    DOB: 1950/06/21  Age: 66 y.o. MRN: [id number]  CC: Diabetes and Hypertension   HPI Caleb [name] is a 66 year old male with a history of type 2 diabetes mellitus (A1c 11.3), hypertension, hyperlipidemia who presents today for a follow-up visit.  He endorses compliance with his insulin and metformin however he has not been adherent with a diabetic diet. He lost his wife 4 months ago and his mother 2 months ago which has led to dietary indiscretion and he informs me his sugars have been in the 200s to 300 range. He denies visual complaints or numbness in extremities. He tries to exercise regularly. He is planning on attending grief counseling with hospice.  He is concerned about his erectile dysfunction which he says has now responded to the use of Viagra.  His blood pressure is elevated today and he endorses running out of his antihypertensive. He denies chest pains, shortness of breath or pedal edema.  Past Medical History:  Diagnosis Date  . Diabetes mellitus without complication ([place])   . Hypercholesteremia   . Hypertension   . Pancreatitis    No past surgical history on file.  No Known Allergies  Outpatient Medications Prior to Visit  Medication Sig Dispense Refill  . amLODipine (NORVASC) 5 MG tablet Take 1 tablet (5 mg total) by mouth daily. 90 tablet 3  . atorvastatin (LIPITOR) 40 MG tablet Take 1 tablet (40 mg total) by mouth daily at 6 PM. 90 tablet 3  . gabapentin (NEURONTIN) 300 MG capsule Take 1 capsule (300 mg total) by mouth 3 (three) times daily. 180 capsule 3  . glucose monitoring kit (FREESTYLE) monitoring kit 1 each by Does not apply route 4 (four) times daily - after meals and at bedtime. 1 month Diabetic Testing Supplies for QAC-QHS accuchecks. 1 each 1  . lisinopril (PRINIVIL,ZESTRIL) 10 MG tablet Take 1 tablet (10 mg total) by mouth daily. 90 tablet 3  . metFORMIN (GLUCOPHAGE) 1000 MG tablet Take 1 tablet (1,000  mg total) by mouth 2 (two) times daily with a meal. 120 tablet 4  . sildenafil (VIAGRA) 100 MG tablet TAKE 1 TABLET BY MOUTH DAILY AS NEEDED FOR ERECTILE DYSFUNCTION 30 tablet 3  . HUMULIN 70/30 (70-30) 100 UNIT/ML injection INJECT 25 UNITS INTO THE SKIN 2 TIMES DAILY WITH A MEAL. 60 mL 3  . metoprolol (LOPRESSOR) 100 MG tablet Take 1 tablet (100 mg total) by mouth 2 (two) times daily. 60 tablet 4  . Vitamin D, Ergocalciferol, (DRISDOL) 50000 units CAPS capsule Take 1 capsule (50,000 Units total) by mouth every 7 (seven) days. (Patient not taking: Reported on 09/13/2016) 12 capsule 0   No facility-administered medications prior to visit.     ROS Review of Systems  Constitutional: Negative for activity change and appetite change.  HENT: Negative for sinus pressure and sore throat.   Eyes: Negative for visual disturbance.  Respiratory: Negative for cough, chest tightness and shortness of breath.   Cardiovascular: Negative for chest pain and leg swelling.  Gastrointestinal: Negative for abdominal distention, abdominal pain, constipation and diarrhea.  Endocrine: Negative.   Genitourinary: Negative for dysuria.  Musculoskeletal: Negative for joint swelling and myalgias.  Skin: Negative for rash.  Allergic/Immunologic: Negative.   Neurological: Negative for weakness, light-headedness and numbness.  Psychiatric/Behavioral: Negative for dysphoric mood and suicidal ideas.    Objective:  BP (!) 167/87   Pulse 90   Temp 97.9 F (36.6 C) (Oral)   Wt  189 lb 12.8 oz (86.1 kg)   SpO2 97%   BMI 29.73 kg/m   BP/Weight 12/13/2016 09/13/2016 0/96/2836  Systolic BP 629 476 546  Diastolic BP 87 79 98  Wt. (Lbs) 189.8 184.4 -  BMI 29.73 28.88 -      Physical Exam  Constitutional: He is oriented to person, place, and time. He appears well-developed and well-nourished.  Cardiovascular: Normal rate, normal heart sounds and intact distal pulses.   No murmur heard. Pulmonary/Chest: Effort normal  and breath sounds normal. He has no wheezes. He has no rales. He exhibits no tenderness.  Abdominal: Soft. Bowel sounds are normal. He exhibits no distension and no mass. There is no tenderness.  Musculoskeletal: Normal range of motion.  Neurological: He is alert and oriented to person, place, and time.  Skin: Skin is warm and dry.  Psychiatric: He has a normal mood and affect.     Lab Results  Component Value Date   HGBA1C 11.3 12/13/2016    CMP Latest Ref Rng & Units 09/13/2016 06/06/2016 06/09/2015  Glucose 65 - 99 mg/dL 191(H) 250(H) 324(H)  BUN 8 - 27 mg/dL '23 23 24  ' Creatinine 0.76 - 1.27 mg/dL 1.09 0.86 1.21  Sodium 134 - 144 mmol/L 138 133(L) 128(L)  Potassium 3.5 - 5.2 mmol/L 4.3 4.3 4.1  Chloride 96 - 106 mmol/L 102 99 95(L)  CO2 20 - 29 mmol/L '20 26 20  ' Calcium 8.6 - 10.2 mg/dL 9.2 9.3 9.2  Total Protein 6.0 - 8.5 g/dL 6.8 7.2 -  Total Bilirubin 0.0 - 1.2 mg/dL 0.3 0.3 -  Alkaline Phos 39 - 117 IU/L 169(H) 174(H) -  AST 0 - 40 IU/L 26 18 -  ALT 0 - 44 IU/L 35 20 -    Lipid Panel     Component Value Date/Time   CHOL 124 09/13/2016 0918   TRIG 114 09/13/2016 0918   HDL 45 09/13/2016 0918   CHOLHDL 2.8 09/13/2016 0918   CHOLHDL 3.9 04/02/2013 1632   VLDL 66 (H) 04/02/2013 1632   LDLCALC 56 09/13/2016 0918    Assessment & Plan:   1. Type 2 diabetes mellitus with complication, with long-term current use of insulin (HCC) Uncontrolled with A1c of 11.3 He has been noncompliant with a diabetic diet largely due to recent bereavement Increase Humulin 70/30 to 32 units twice a day Advised to keep blood sugar log fasting, 2 hours post lunch and bedtime which will be reviewed at the next office visit. Emphasized the need to be compliant with a diabetic diet - POCT glucose (manual entry) - POCT glycosylated hemoglobin (Hb A1C)  2. Essential hypertension Uncontrolled due to running out of amlodipine Advised to pick up prescription from pharmacy - Basic Metabolic  Panel  3. Erectile dysfunction, unspecified erectile dysfunction type No improvement despite use of Viagra - Testosterone Total,Free,Bio, Males  4. Pure hypercholesterolemia Continue statin Low cholesterol diet  5. Screening for colon cancer - Ambulatory referral to Gastroenterology   Meds ordered this encounter  Medications  . HUMULIN 70/30 (70-30) 100 UNIT/ML injection    Sig: INJECT 32 UNITS INTO THE SKIN 2 TIMES DAILY WITH A MEAL.    Dispense:  60 mL    Refill:  3    Discontinue previous dose  . metoprolol tartrate (LOPRESSOR) 100 MG tablet    Sig: Take 1 tablet (100 mg total) by mouth 2 (two) times daily.    Dispense:  60 tablet    Refill:  4  Follow-up: Return in about 3 months (around 03/14/2017) for Follow-up of diabetes and hypertension.   Arnoldo Morale MD

## 2016-12-14 LAB — BASIC METABOLIC PANEL
BUN/Creatinine Ratio: 18 (ref 10–24)
BUN: 19 mg/dL (ref 8–27)
CO2: 24 mmol/L (ref 20–29)
Calcium: 9.5 mg/dL (ref 8.6–10.2)
Chloride: 98 mmol/L (ref 96–106)
Creatinine, Ser: 1.06 mg/dL (ref 0.76–1.27)
GFR calc Af Amer: 84 mL/min/{1.73_m2} (ref >59)
GFR calc non Af Amer: 73 mL/min/{1.73_m2} (ref >59)
Glucose: 181 mg/dL — ABNORMAL HIGH (ref 65–99)
Potassium: 4.2 mmol/L (ref 3.5–5.2)
Sodium: 136 mmol/L (ref 134–144)

## 2016-12-15 ENCOUNTER — Telehealth: Payer: Self-pay

## 2016-12-15 LAB — TESTOSTERONE,FREE AND TOTAL
Testosterone, Free: 12.7 pg/mL (ref 6.6–18.1)
Testosterone: 363 ng/dL (ref 264–916)

## 2016-12-15 NOTE — Telephone Encounter (Signed)
Pt was called and VM was full and I was unable to leave a message.

## 2016-12-16 ENCOUNTER — Telehealth: Payer: Self-pay

## 2016-12-16 NOTE — Telephone Encounter (Signed)
Pt was called and informed of lab results. 

## 2016-12-23 MED FILL — LISINOPRIL 10 MG TABS: 10 | 30 days supply | Qty: 30 | Fill #2

## 2016-12-23 MED FILL — AMLODIPINE BESYLATE 5 MG TA: 5 | 30 days supply | Qty: 30 | Fill #2

## 2016-12-23 MED FILL — GABAPENTIN 300 MG CAPSULE: 300 | 30 days supply | Qty: 90 | Fill #3

## 2016-12-23 MED FILL — ?ATORVASTATIN 40MG TABLET: 40 | 30 days supply | Qty: 30 | Fill #2

## 2016-12-23 MED FILL — $VIAGRA 100 MG TABLET: 100 | 30 days supply | Qty: 10 | Fill #2

## 2016-12-23 MED FILL — ?METOPROLOL 100 MG TABLET: 100 | 30 days supply | Qty: 60 | Fill #2

## 2017-02-21 MED FILL — $VIAGRA 100 MG TABLET: 100 | 30 days supply | Qty: 10 | Fill #3

## 2017-03-06 MED FILL — GABAPENTIN 300 MG CAPSULE: 300 | 30 days supply | Qty: 90 | Fill #4

## 2017-03-06 MED FILL — AMLODIPINE BESYLATE 5 MG TA: 5 | 30 days supply | Qty: 30 | Fill #3

## 2017-03-06 MED FILL — ?METOPROLOL 100 MG TABLET: 100 | 30 days supply | Qty: 60 | Fill #3

## 2017-03-06 MED FILL — LISINOPRIL 10 MG TABS: 10 | 30 days supply | Qty: 30 | Fill #3

## 2017-03-06 MED FILL — ?ATORVASTATIN 40MG TABLET: 40 | 30 days supply | Qty: 30 | Fill #3

## 2017-03-06 MED FILL — ?HUMULIN 70/30 VIAL: (70-30) 100 | 30 days supply | Qty: 20 | Fill #0

## 2017-03-16 ENCOUNTER — Ambulatory Visit: Payer: Medicare Other | Admitting: Family Medicine

## 2017-03-31 ENCOUNTER — Ambulatory Visit: Payer: Medicare Other | Admitting: Family Medicine

## 2017-04-20 MED FILL — $VIAGRA 100 MG TABLET: 100 | 30 days supply | Qty: 10 | Fill #4

## 2017-05-05 ENCOUNTER — Ambulatory Visit: Payer: Medicare Other | Admitting: Family Medicine

## 2017-06-12 MED FILL — ?HUMULIN 70/30 VIAL: (70-30) 100 | 30 days supply | Qty: 20 | Fill #1

## 2017-06-12 MED FILL — $VIAGRA 100 MG TABLET: 100 | 30 days supply | Qty: 10 | Fill #5

## 2017-06-30 ENCOUNTER — Ambulatory Visit: Payer: Medicare Other | Admitting: Family Medicine

## 2017-09-28 ENCOUNTER — Ambulatory Visit: Payer: Medicare HMO | Attending: Family Medicine | Admitting: Physician Assistant

## 2017-09-28 VITALS — BP 148/73 | HR 69 | Temp 98.2°F | Resp 18 | Ht 66.0 in | Wt 186.0 lb

## 2017-09-28 DIAGNOSIS — I10 Essential (primary) hypertension: Secondary | ICD-10-CM | POA: Insufficient documentation

## 2017-09-28 DIAGNOSIS — E7800 Pure hypercholesterolemia, unspecified: Secondary | ICD-10-CM | POA: Diagnosis not present

## 2017-09-28 DIAGNOSIS — Z9114 Patient's other noncompliance with medication regimen: Secondary | ICD-10-CM | POA: Diagnosis not present

## 2017-09-28 DIAGNOSIS — Z79899 Other long term (current) drug therapy: Secondary | ICD-10-CM | POA: Diagnosis not present

## 2017-09-28 DIAGNOSIS — E1165 Type 2 diabetes mellitus with hyperglycemia: Secondary | ICD-10-CM | POA: Insufficient documentation

## 2017-09-28 DIAGNOSIS — E559 Vitamin D deficiency, unspecified: Secondary | ICD-10-CM

## 2017-09-28 DIAGNOSIS — Z794 Long term (current) use of insulin: Secondary | ICD-10-CM | POA: Insufficient documentation

## 2017-09-28 LAB — GLUCOSE, POCT (MANUAL RESULT ENTRY): POC Glucose: 250 mg/dl — AB (ref 70–99)

## 2017-09-28 MED ORDER — ONETOUCH DELICA LANCETS 33G MISC: 3 refills | Status: DC

## 2017-09-28 MED ORDER — AMLODIPINE BESYLATE 5 MG PO TABS: 5.0000 mg | ORAL_TABLET | Freq: Every day | ORAL | 3 refills | Status: DC

## 2017-09-28 MED ORDER — METOPROLOL TARTRATE 100 MG PO TABS: 100.0000 mg | ORAL_TABLET | Freq: Two times a day (BID) | ORAL | 4 refills | Status: DC

## 2017-09-28 MED ORDER — METFORMIN HCL 1000 MG PO TABS: 1000.0000 mg | ORAL_TABLET | Freq: Two times a day (BID) | ORAL | 4 refills | Status: DC

## 2017-09-28 MED ORDER — GLUCOSE BLOOD VI STRP: ORAL_STRIP | 12 refills | Status: DC

## 2017-09-28 MED ORDER — HUMULIN 70/30 (70-30) 100 UNIT/ML ~~LOC~~ SUSP: SUBCUTANEOUS | 3 refills | Status: DC

## 2017-09-28 MED ORDER — ATORVASTATIN CALCIUM 40 MG PO TABS: 40.0000 mg | ORAL_TABLET | Freq: Every day | ORAL | 3 refills | Status: DC

## 2017-09-28 MED ORDER — ONETOUCH VERIO W/DEVICE KIT: 1.0000 | PACK | Freq: Two times a day (BID) | 0 refills | Status: DC

## 2017-09-28 MED ORDER — LISINOPRIL 10 MG PO TABS: 10.0000 mg | ORAL_TABLET | Freq: Every day | ORAL | 3 refills | Status: DC

## 2017-09-28 MED ORDER — GABAPENTIN 300 MG PO CAPS: 300.0000 mg | ORAL_CAPSULE | Freq: Three times a day (TID) | ORAL | 3 refills | Status: DC

## 2017-09-28 MED FILL — AMLODIPINE BESYLATE 5 MG TA: 5 | 30 days supply | Qty: 30 | Fill #0

## 2017-09-28 MED FILL — ATORVASTATIN CALCIUM 40 MG: 40 | 30 days supply | Qty: 30 | Fill #0

## 2017-09-28 MED FILL — metFORMIN HCL 1000 MG TABS: 1000 | 30 days supply | Qty: 60 | Fill #0

## 2017-09-28 MED FILL — GABAPENTIN 300 MG CAPSULE: 300 | 30 days supply | Qty: 90 | Fill #0

## 2017-09-28 MED FILL — METOPROLOL TARTRATE 100 MG: 100 | 30 days supply | Qty: 60 | Fill #0

## 2017-09-28 MED FILL — LISINOPRIL 10 MG TABS: 10 | 30 days supply | Qty: 30 | Fill #0

## 2017-09-28 MED FILL — !HUMULIN 70/30 VIAL: 70/30 | 31 days supply | Qty: 20 | Fill #0

## 2017-09-28 NOTE — Progress Notes (Signed)
Patient ID: Caleb [name], male   DOB: 23-Mar-1950, 67 y.o.   MRN: [id number]   Caleb [name], is a 67 y.o. male  IRC:[id number]  PZW:[id number]  DOB - March 23, 1950  Subjective:  Chief Complaint and HPI: Caleb [name] is a 67 y.o. male here today for med RF.  Not checking blood sugars.  Meter is not working and needs a new meter.  Has been out of all insulin at least 2-3 months.  Out of most of his pills too.  Still had a few metformin and a few BP meds, but not sure which ones-so not taking any meds consistently.  No problems or complaints.  Ate nuts and fritos this morning.    ROS:   Constitutional:  No f/c, No night sweats, No unexplained weight loss. EENT:  No vision changes, No blurry vision, No hearing changes. No mouth, throat, or ear problems.  Respiratory: No cough, No SOB Cardiac: No CP, no palpitations GI:  No abd pain, No N/V/D. GU: No Urinary s/sx Musculoskeletal: No joint pain Neuro: No headache, no dizziness, no motor weakness.  Skin: No rash Endocrine:  No polydipsia. No polyuria.  Psych: Denies SI/HI  No problems updated.  ALLERGIES: No Known Allergies  PAST MEDICAL HISTORY: Past Medical History:  Diagnosis Date  . Diabetes mellitus without complication ([place])   . Hypercholesteremia   . Hypertension   . Pancreatitis     MEDICATIONS AT HOME: Prior to Admission medications   Medication Sig Start Date End Date Taking? Authorizing Provider  amLODipine (NORVASC) 5 MG tablet Take 1 tablet (5 mg total) by mouth daily. 09/28/17  Yes Freeman Caldron M, PA-C  atorvastatin (LIPITOR) 40 MG tablet Take 1 tablet (40 mg total) by mouth daily at 6 PM. 09/28/17  Yes McClung, Dionne Bucy, PA-C  gabapentin (NEURONTIN) 300 MG capsule Take 1 capsule (300 mg total) by mouth 3 (three) times daily. 09/28/17  Yes McClung, Dionne Bucy, PA-C  HUMULIN 70/30 (70-30) 100 UNIT/ML injection INJECT 32 UNITS INTO THE SKIN 2 TIMES DAILY WITH A MEAL. 09/28/17  Yes McClung, Angela M, PA-C  lisinopril  (PRINIVIL,ZESTRIL) 10 MG tablet Take 1 tablet (10 mg total) by mouth daily. 09/28/17  Yes Freeman Caldron M, PA-C  metFORMIN (GLUCOPHAGE) 1000 MG tablet Take 1 tablet (1,000 mg total) by mouth 2 (two) times daily with a meal. 09/28/17  Yes McClung, Angela M, PA-C  metoprolol tartrate (LOPRESSOR) 100 MG tablet Take 1 tablet (100 mg total) by mouth 2 (two) times daily. 09/28/17  Yes McClung, Angela M, PA-C  sildenafil (VIAGRA) 100 MG tablet TAKE 1 TABLET BY MOUTH DAILY AS NEEDED FOR ERECTILE DYSFUNCTION 09/26/16  Yes Jegede, Marlena Clipper, MD  Vitamin D, Ergocalciferol, (DRISDOL) 50000 units CAPS capsule Take 1 capsule (50,000 Units total) by mouth every 7 (seven) days. 06/10/15  Yes Langeland, Dawn T, MD  Blood Glucose Monitoring Suppl (ONETOUCH VERIO) w/Device KIT 1 each by Does not apply route 2 (two) times daily. 09/28/17   Argentina Donovan, PA-C  glucose blood test strip Use as instructed 09/28/17   Argentina Donovan, PA-C  [facility] DELICA LANCETS 42P MISC Check fasting and at bedtime 09/28/17   Argentina Donovan, PA-C     Objective:  EXAM:   Vitals:   09/28/17 1050  BP: (!) 148/73  Pulse: 69  Resp: 18  Temp: 98.2 F (36.8 C)  TempSrc: Oral  SpO2: 100%  Weight: 186 lb (84.4 kg)  Height: '5\' 6"'  (1.676 m)    General appearance :  A&OX3. NAD. Non-toxic-appearing HEENT: Atraumatic and Normocephalic.  PERRLA. EOM intact.   Neck: supple, no JVD. No cervical lymphadenopathy. No thyromegaly Chest/Lungs:  Breathing-non-labored, Good air entry bilaterally, breath sounds normal without rales, rhonchi, or wheezing  CVS: S1 S2 regular, no murmurs, gallops, rubs  Extremities: Bilateral Lower Ext shows no edema, both legs are warm to touch with = pulse throughout Neurology:  CN II-XII grossly intact, Non focal.   Psych:  TP linear. J/I WNL. Normal speech. Appropriate eye contact and affect.  Skin:  No Rash  Data Review Lab Results  Component Value Date   HGBA1C 11.3 12/13/2016   HGBA1C 10.6  09/13/2016   HGBA1C 11.1 06/06/2016     Assessment & Plan   1. Type 2 diabetes mellitus with hyperglycemia, without long-term current use of insulin (HCC) Uncontrolled-resume medications at most recent doses.  Check blood sugar fasting and at bedtime and record and bring that in for recheck in 1 month.   - Glucose (CBG) - Hemoglobin A1c - Comprehensive metabolic panel  2. Essential hypertension Uncontrolled-resume medications bc he was out of some meds - Comprehensive metabolic panel - CBC with Differential/Platelet  3. Pure hypercholesterolemia Likely uncontrolled - Comprehensive metabolic panel - Lipid panel  4. Vitamin D deficiency Will check status - Vitamin D, 25-hydroxy  5.  Noncompliance-counseled at length.  Ordered new meter.    Patient have been counseled extensively about nutrition and exercise  Return in about 1 month (around 10/29/2017) for Dr Margarita Rana; recheck DM and htn after restarting meds.  The patient was given clear instructions to go to ER or return to medical center if symptoms don't improve, worsen or new problems develop. The patient verbalized understanding. The patient was told to call to get lab results if they haven't heard anything in the next week.     Freeman Caldron, PA-C [facility] [place], [place]   09/28/2017, 11:03 AM

## 2017-09-28 NOTE — Patient Instructions (Signed)
Check blood sugars fasting and at bedtime and record and bring to next visit 

## 2017-09-28 NOTE — Progress Notes (Signed)
250

## 2017-09-29 ENCOUNTER — Other Ambulatory Visit: Payer: Self-pay | Admitting: Physician Assistant

## 2017-09-29 LAB — CBC WITH DIFFERENTIAL/PLATELET
Basophils Absolute: 0 10*3/uL (ref 0.0–0.2)
Basos: 0 %
EOS (ABSOLUTE): 0.3 10*3/uL (ref 0.0–0.4)
Eos: 4 %
Hematocrit: 43.2 % (ref 37.5–51.0)
Hemoglobin: 15.1 g/dL (ref 13.0–17.7)
Immature Grans (Abs): 0 10*3/uL (ref 0.0–0.1)
Immature Granulocytes: 0 %
Lymphocytes Absolute: 3.3 10*3/uL — ABNORMAL HIGH (ref 0.7–3.1)
Lymphs: 54 %
MCH: 28.5 pg (ref 26.6–33.0)
MCHC: 35 g/dL (ref 31.5–35.7)
MCV: 82 fL (ref 79–97)
Monocytes Absolute: 0.7 10*3/uL (ref 0.1–0.9)
Monocytes: 11 %
Neutrophils Absolute: 2 10*3/uL (ref 1.4–7.0)
Neutrophils: 31 %
Platelets: 243 10*3/uL (ref 150–450)
RBC: 5.29 x10E6/uL (ref 4.14–5.80)
RDW: 15 % (ref 12.3–15.4)
WBC: 6.3 10*3/uL (ref 3.4–10.8)

## 2017-09-29 LAB — COMPREHENSIVE METABOLIC PANEL
ALT: 38 IU/L (ref 0–44)
AST: 21 IU/L (ref 0–40)
Albumin/Globulin Ratio: 1.5 (ref 1.2–2.2)
Albumin: 4.3 g/dL (ref 3.6–4.8)
Alkaline Phosphatase: 183 IU/L — ABNORMAL HIGH (ref 39–117)
BUN/Creatinine Ratio: 23 (ref 10–24)
BUN: 28 mg/dL — ABNORMAL HIGH (ref 8–27)
Bilirubin Total: 0.3 mg/dL (ref 0.0–1.2)
CO2: 22 mmol/L (ref 20–29)
Calcium: 9.4 mg/dL (ref 8.6–10.2)
Chloride: 97 mmol/L (ref 96–106)
Creatinine, Ser: 1.21 mg/dL (ref 0.76–1.27)
GFR calc Af Amer: 71 mL/min/{1.73_m2} (ref >59)
GFR calc non Af Amer: 62 mL/min/{1.73_m2} (ref >59)
Globulin, Total: 2.8 g/dL (ref 1.5–4.5)
Glucose: 232 mg/dL — ABNORMAL HIGH (ref 65–99)
Potassium: 4.9 mmol/L (ref 3.5–5.2)
Sodium: 135 mmol/L (ref 134–144)
Total Protein: 7.1 g/dL (ref 6.0–8.5)

## 2017-09-29 LAB — VITAMIN D 25 HYDROXY (VIT D DEFICIENCY, FRACTURES): Vit D, 25-Hydroxy: 17.2 ng/mL — ABNORMAL LOW (ref 30.0–100.0)

## 2017-09-29 LAB — HEMOGLOBIN A1C
Est. average glucose Bld gHb Est-mCnc: 249 mg/dL
Hgb A1c MFr Bld: 10.3 % — ABNORMAL HIGH (ref 4.8–5.6)

## 2017-09-29 LAB — LIPID PANEL
Chol/HDL Ratio: 4.8 ratio (ref 0.0–5.0)
Cholesterol, Total: 206 mg/dL — ABNORMAL HIGH (ref 100–199)
HDL: 43 mg/dL (ref >39)
LDL Calculated: 94 mg/dL (ref 0–99)
Triglycerides: 346 mg/dL — ABNORMAL HIGH (ref 0–149)
VLDL Cholesterol Cal: 69 mg/dL — ABNORMAL HIGH (ref 5–40)

## 2017-09-29 MED ORDER — VITAMIN D (ERGOCALCIFEROL) 1.25 MG (50000 UNIT) PO CAPS: 50000.0000 [IU] | ORAL_CAPSULE | ORAL | 0 refills | Status: DC

## 2017-10-03 ENCOUNTER — Other Ambulatory Visit: Payer: Self-pay

## 2017-10-03 ENCOUNTER — Telehealth: Payer: Self-pay | Admitting: *Deleted

## 2017-10-03 DIAGNOSIS — E118 Type 2 diabetes mellitus with unspecified complications: Secondary | ICD-10-CM

## 2017-10-03 DIAGNOSIS — Z794 Long term (current) use of insulin: Secondary | ICD-10-CM

## 2017-10-03 MED ORDER — GLUCOSE BLOOD VI STRP: ORAL_STRIP | 12 refills | Status: DC

## 2017-10-03 MED ORDER — ONETOUCH DELICA LANCETS 33G MISC: 12 refills | Status: DC

## 2017-10-03 MED ORDER — ONETOUCH VERIO W/DEVICE KIT: 1.0000 | PACK | Freq: Two times a day (BID) | 0 refills | Status: DC

## 2017-10-03 NOTE — Telephone Encounter (Signed)
-----   Message from Argentina Donovan, [place] sent at 09/29/2017  8:36 AM EDT ----- Please call patient.  Hemoglobin A1C was 10.3 and cholesterol was very high.  Vitamin D is still low and I will send a new prescription for that.  This can contribute to muscle aches, anxiety, fatigue, and depression. Resume all medications and check sugars as we discussed.  Keep follow up appt as planned.  His alkaline phosphatase was also elevated.  We will recheck this at his follow-up after resuming medications. Thanks, Freeman Caldron, PA-C

## 2017-10-03 NOTE — Telephone Encounter (Signed)
Patient verified DOB Patient is aware of A1C being 10.3 and cholesterol being elevated. Patient is also aware of minerals being elevated and vitamin d level being low. Patient advised to adhere to all medications and follow up as planned.

## 2017-10-03 NOTE — Telephone Encounter (Signed)
In order to bill medicare properly the pharmacy needed the diabetic supplies sent directly to them and they need to include dx codes

## 2017-11-22 ENCOUNTER — Ambulatory Visit: Payer: Medicare Other | Admitting: Family Medicine

## 2018-01-22 ENCOUNTER — Ambulatory Visit: Payer: Medicare Other | Admitting: Family Medicine

## 2018-02-08 ENCOUNTER — Ambulatory Visit: Payer: Medicare HMO | Attending: Family Medicine | Admitting: Family Medicine

## 2018-02-08 VITALS — BP 151/90 | HR 76 | Temp 98.1°F | Resp 16 | Ht 67.5 in | Wt 189.6 lb

## 2018-02-08 DIAGNOSIS — E1142 Type 2 diabetes mellitus with diabetic polyneuropathy: Secondary | ICD-10-CM

## 2018-02-08 DIAGNOSIS — L84 Corns and callosities: Secondary | ICD-10-CM | POA: Diagnosis not present

## 2018-02-08 DIAGNOSIS — R011 Cardiac murmur, unspecified: Secondary | ICD-10-CM

## 2018-02-08 DIAGNOSIS — I10 Essential (primary) hypertension: Secondary | ICD-10-CM | POA: Diagnosis not present

## 2018-02-08 DIAGNOSIS — N529 Male erectile dysfunction, unspecified: Secondary | ICD-10-CM

## 2018-02-08 DIAGNOSIS — E7800 Pure hypercholesterolemia, unspecified: Secondary | ICD-10-CM

## 2018-02-08 DIAGNOSIS — E1169 Type 2 diabetes mellitus with other specified complication: Secondary | ICD-10-CM | POA: Insufficient documentation

## 2018-02-08 DIAGNOSIS — Z79899 Other long term (current) drug therapy: Secondary | ICD-10-CM

## 2018-02-08 DIAGNOSIS — E1165 Type 2 diabetes mellitus with hyperglycemia: Secondary | ICD-10-CM | POA: Diagnosis not present

## 2018-02-08 LAB — GLUCOSE, POCT (MANUAL RESULT ENTRY): POC Glucose: 173 mg/dL — AB (ref 70–99)

## 2018-02-08 LAB — POCT GLYCOSYLATED HEMOGLOBIN (HGB A1C): HbA1c, POC (controlled diabetic range): 7.7 % — AB (ref 0.0–7.0)

## 2018-02-08 MED ORDER — SILDENAFIL CITRATE 100 MG PO TABS: ORAL_TABLET | ORAL | 3 refills | Status: DC

## 2018-02-08 MED ORDER — ROSUVASTATIN CALCIUM 20 MG PO TABS: 20.0000 mg | ORAL_TABLET | Freq: Every day | ORAL | 3 refills | Status: DC

## 2018-02-08 MED ORDER — LISINOPRIL 10 MG PO TABS: 10.0000 mg | ORAL_TABLET | Freq: Every day | ORAL | 3 refills | Status: DC

## 2018-02-08 MED FILL — SILDENAFIL CITRATE 100 MG T: 100 | 30 days supply | Qty: 10 | Fill #0

## 2018-02-08 MED FILL — ROSUVASTATIN CALCIUM 20 MG: 20 | 30 days supply | Qty: 30 | Fill #0

## 2018-02-08 MED FILL — LISINOPRIL 10 MG TABS: 10 | 30 days supply | Qty: 30 | Fill #0

## 2018-02-08 NOTE — Progress Notes (Signed)
189.6 5'7.5   

## 2018-02-08 NOTE — Progress Notes (Signed)
Subjective:    Patient ID: Caleb [name], male    DOB: 07-20-1950, 67 y.o.   MRN: [id number]  HPI      67 year old male new to me as a patient who was last seen in the office on 09/28/2017 by another provider in follow-up of type 2 diabetes and other chronic medical issues.  Patient's hemoglobin A1c was 10.3 in July.  Patient however had been out of some of his medications for the treatment of diabetes prior to the appointment.  Patient had lipid panel in July showing total cholesterol of 206, triglycerides of 346 and LDL of 94.  Patient with alkaline phosphatase elevated at 183 and glucose of 232, otherwise normal CMP.       Patient reports that he has been compliant with his diabetes medicines.  Patient denies any increased thirst, no urinary frequency and no blurred vision.  Patient states that his major issue related to diabetes is painful numbness and occasional sharp pain in his feet.  Patient also reports that he stubbed his toes against some steps a few months ago.  Patient states that there had been some color change in his great toe and the second toe but his daughter who is a pediatrician had 1 of her friends placed the patient on an antibiotic and patient states that the normal color return to his toes.  Patient however states that his nails continue to look abnormal and patient has noticed a black spot near the underside of his left great toe.  Patient reports that since his last visit, he has made major changes in his diet and greatly reduced his carbohydrate intake.  Patient is also exercising on a regular basis.  Patient denies any issues with chest pain, shortness of breath or palpitations.        Patient reports that he has never smoked.  Patient reports that he is compliant with his blood pressure medications.  Patient is also taking his atorvastatin for hyperlipidemia.  Patient states that he does have issues with erectile dysfunction and he was told that he needed to come in today to have  a doctor's appointment in order to get a refill of  medication.  Patient states that he has never had any issues with chest pain, shortness of breath, severe headaches or visual changes with the use of Viagra.  Patient states that he is not sure that he is ever had a PSA and he would like to have one done at today's visit.  Patient denies nocturia and does not have to strain to initiate his urinary stream.  Patient is not sure that he has any weakening of his urinary stream but he believes that it is not as forceful as it has been in the past.  Patient denies any family history of prostate cancer. Past Medical History:  Diagnosis Date  . Diabetes mellitus without complication ([place])   . Hypercholesteremia   . Hypertension   . Pancreatitis   Patient denies any past surgeries Family History  Problem Relation Age of Onset  . CAD Father   . Hypertension Father   . Social History   Tobacco Use  . Smoking status: Former Smoker    Types: Cigarettes  . Smokeless tobacco: Never Used  Substance Use Topics  . Alcohol use: No  . Drug use: No  No Known Allergies         Review of Systems  Constitutional: Negative for activity change, chills, diaphoresis, fatigue, fever and unexpected weight change.  HENT: Negative for congestion, dental problem, hearing loss, postnasal drip, rhinorrhea, sinus pressure, sore throat and trouble swallowing.   Eyes: Negative for photophobia and visual disturbance.  Respiratory: Negative for cough and shortness of breath.   Cardiovascular: Negative for chest pain and leg swelling.  Gastrointestinal: Negative for abdominal pain, blood in stool, constipation, diarrhea and nausea.  Endocrine: Negative for polydipsia, polyphagia and polyuria.  Genitourinary: Negative for decreased urine volume, difficulty urinating, dysuria, flank pain, frequency, hematuria and urgency.  Musculoskeletal: Positive for gait problem (due to neuropathy). Negative for arthralgias, back pain  and joint swelling.  Neurological: Positive for numbness. Negative for dizziness, light-headedness and headaches.       Objective:   Physical Exam BP (!) 151/90   Pulse 76   Temp 98.1 F (36.7 C) (Oral)   Resp 16   Ht 5' 7.5" (1.715 m)   Wt 189 lb 9.6 oz (86 kg)   SpO2 97%   BMI 29.26 kg/m BP (!) 151/90   Pulse 76   Temp 98.1 F (36.7 C) (Oral)   Resp 16   Ht 5' 7.5" (1.715 m)   Wt 189 lb 9.6 oz (86 kg)   SpO2 97%   BMI 29.26 kg/m Nurse's notes and vital signs reviewed General-well-nourished, well-developed older male in no acute distress.  Patient is wearing glasses ENT- TMs gray, normal nasal exam, normal oropharynx Neck-supple, no lymphadenopathy, no thyromegaly, no carotid bruits but patient with some mild noise in the left carotid which may be referred as patient with heart murmur Cardiovascular- regular rate and rhythm, patient has a holosystolic murmur Lungs-clear to auscultation bilaterally Back-no CVA tenderness Extremities-no edema Diabetic foot exam- patient has some thickening and discoloration to the nails.  Patient with abnormal monofilament to the bilateral lateral feet as patient with absent sensation.  Patient does not have any active skin breakdown on the feet other than possible pre-ulcerative callus versus scabbed area on the tip of the left second great toe.  Patient has some mild edema of the right second toe and great toe as compared to the right foot.  Patient has a small blackened area which appears to be beneath the skin on the underside of the right great toe.  Patient with nonpalpable peripheral pulses on both the right and left foot.  Patient with dry skin on the soles of the feet.      Assessment & Plan:  1. Type 2 diabetes mellitus with other specified complication, unspecified whether long term insulin use ([place]) Patient with improvement in control of his blood sugar and patient's hemoglobin A1c at today's visit is 7.7.  Patient with a random  glucose of 173.  Patient however reports that he stopped his left great and second toe a few months ago and did have some color change for which he took antibiotics.  Patient however has nonpalpable peripheral pulses and patient has a lesion on the tip of the left second toe.  Patient is being referred to podiatry for urgent evaluation however patient reports that he will be at [facility] starting tomorrow and will not return until November 30.  Discussed with patient to use caution with prolonged walking and if notices any changes in color of the toes or signs of acute infection he should seek medical attention.  Patient will continue his current medications and current monitoring of blood sugars.  Continue healthy diet and exercise as tolerated.  Patient will also be referred for diabetic eye exam.  Patient with cardiac  risk factors including age, gender, diabetes, hyperlipidemia and hypertension for which he will be referred to cardiology for further evaluation and treatment and patient additionally with murmur on heart exam. - POCT glucose (manual entry) - POCT glycosylated hemoglobin (Hb A1C) - Ambulatory referral to Podiatry - Ambulatory referral to Cardiology - Comprehensive metabolic panel - Microalbumin/Creatinine Ratio, Urine - Ambulatory referral to Ophthalmology  2. Essential hypertension Patient's blood pressure was slightly elevated above normal at today's visit.  Original plan was to increase patient's lisinopril from 10 mg to 20 mg daily but patient had reported to the [facility] that he is not currently taking the lisinopril therefore new prescription was sent in for lisinopril and patient will also continue amlodipine and metoprolol. - lisinopril (PRINIVIL,ZESTRIL) 10 MG tablet; Take 1 tablet (10 mg total) by mouth daily.  Dispense: 90 tablet; Refill: 3  3. Pure hypercholesterolemia Patient has been on atorvastatin previously and on today's exam, patient has decreased peripheral pulses.   Patient denied past smoking use at today's visit but later stated  that he smoked for a very short while, less than a year.  Patient will be switched to rosuvastatin 20 mg daily.  Patient had lipid panel done in July 2019 with LDL of 94 and triglycerides of 346.  Patient reports that he had triglycerides greater than the thousand a few years ago when he was hospitalized for pancreatitis and then placed on medication for treatment of his triglycerides. - rosuvastatin (CRESTOR) 20 MG tablet; Take 1 tablet (20 mg total) by mouth daily. To lower cholesterol  Dispense: 90 tablet; Refill: 3  4. Heart murmur Patient with presence of a heart murmur on exam and upon questioning, patient states that he recalled being told that he had a heart murmur about 15 years ago but he does not recall anything about the diagnosis.  Patient denies any episodes of feeling dizzy/lightheaded or short of breath.  Patient has had no chest pain.  Patient will be referred to cardiology for further evaluation and treatment. - Ambulatory referral to Cardiology  5. Pre-ulcerative calluses Patient with positive pre-ulcerative callus on the tip of the second left toe status post injury.  Patient is being referred to podiatry for further evaluation and treatment - Ambulatory referral to Podiatry  6. Diabetic peripheral neuropathy associated with type 2 diabetes mellitus ([place]) Patient with diabetic neuropathy and status post injury to the left great toe and second toe.  Patient is currently on gabapentin.  Patient is being referred to neurology for further evaluation and treatment - Ambulatory referral to Podiatry  7. Erectile dysfunction, unspecified erectile dysfunction type Patient with erectile dysfunction as well as some decreased force of the urinary stream.  Patient will have PSA as a screening test for prostate cancer.  Patient given refill of Viagra but he is aware that if he has chest pain, severe headache, visual changes that  he needs to stop the medication and seek medical attention - PSA - sildenafil (VIAGRA) 100 MG tablet; TAKE 1 TABLET BY MOUTH DAILY AS NEEDED FOR ERECTILE DYSFUNCTION  Dispense: 10 tablet; Refill: 3  8. Long-term use of high-risk medication Patient will have CMP in follow-up of long-term use of high-risk medications including medications for treatment of diabetes and statin medication  *Influenza immunization was offered at today's visit and declined by the patient  An After Visit Summary was printed and given to the patient.  Return in about 4 months (around 06/09/2018) for 2-3 week BP check; 4 months.

## 2018-02-09 LAB — COMPREHENSIVE METABOLIC PANEL WITH GFR
ALT: 24 IU/L (ref 0–44)
AST: 23 IU/L (ref 0–40)
Albumin/Globulin Ratio: 1.7 (ref 1.2–2.2)
Albumin: 4.4 g/dL (ref 3.6–4.8)
Alkaline Phosphatase: 161 IU/L — ABNORMAL HIGH (ref 39–117)
BUN/Creatinine Ratio: 16 (ref 10–24)
BUN: 14 mg/dL (ref 8–27)
Bilirubin Total: 0.3 mg/dL (ref 0.0–1.2)
CO2: 24 mmol/L (ref 20–29)
Calcium: 9.2 mg/dL (ref 8.6–10.2)
Chloride: 98 mmol/L (ref 96–106)
Creatinine, Ser: 0.9 mg/dL (ref 0.76–1.27)
GFR calc Af Amer: 102 mL/min/1.73
GFR calc non Af Amer: 88 mL/min/1.73
Globulin, Total: 2.6 g/dL (ref 1.5–4.5)
Glucose: 174 mg/dL — ABNORMAL HIGH (ref 65–99)
Potassium: 4.7 mmol/L (ref 3.5–5.2)
Sodium: 138 mmol/L (ref 134–144)
Total Protein: 7 g/dL (ref 6.0–8.5)

## 2018-02-09 LAB — PSA: Prostate Specific Ag, Serum: 0.4 ng/mL (ref 0.0–4.0)

## 2018-02-09 LAB — MICROALBUMIN / CREATININE URINE RATIO
Creatinine, Urine: 140.5 mg/dL
Microalb/Creat Ratio: <2.1 mg/g{creat} (ref 0.0–30.0)
Microalbumin, Urine: <3 ug/mL

## 2018-02-20 NOTE — Progress Notes (Signed)
Cardiology Office Note   Date:  02/22/2018   ID:  Caleb [name], DOB 09-09-1950, MRN [id number]  PCP:  [name], MD  Cardiologist:   No primary care provider on file. Referring:  [name], MD  Chief Complaint  Patient presents with  . Heart Murmur      History of Present Illness: Caleb [name] is a 67 y.o. male who is referred by [name], MD for evaluation of a murmur.  He was noted to have a heart murmur.  He is not aware of having this before.  He did have an echo in 2011 demonstrated a normal EF.  There was some thickening of his aortic valve leaflets and trivial MR.  He actually does quite well.  He has diabetes and has some significant neuropathy.  However, he exercises by doing the stair climber, the Pearl ladder and swimming.  With this he denies any cardiovascular symptoms. The patient denies any new symptoms such as chest discomfort, neck or arm discomfort. There has been no new shortness of breath, PND or orthopnea. There have been no reported palpitations, presyncope or syncope.    Past Medical History:  Diagnosis Date  . Diabetes mellitus without complication ([place])   . Dyslipidemia 09/27/2012  . Erectile dysfunction 09/27/2012  . Hypercholesteremia   . Hyperlipidemia 08/12/2012  . Hypertension   . Hyponatremia 08/14/2012  . Pancreatitis   . Pancreatitis, acute 09/27/2012  . Vitamin D deficiency 06/23/2015    Past Surgical History:  Procedure Laterality Date  . KNEE SURGERY       Current Outpatient Medications  Medication Sig Dispense Refill  . amLODipine (NORVASC) 5 MG tablet Take 1 tablet (5 mg total) by mouth daily. 90 tablet 3  . Blood Glucose Monitoring Suppl (ONETOUCH VERIO) w/Device KIT 1 each by Does not apply route 2 (two) times daily. Use as instructed twice daily, check fasting sugar in morning and check before bedtime. E11.8 1 kit 0  . gabapentin (NEURONTIN) 300 MG capsule Take 1 capsule (300 mg total) by mouth 3 (three) times  daily. 180 capsule 3  . glucose blood test strip Use as instructed twice daily, check fasting sugar in morning and check before bedtime. E11.8 100 each 12  . HUMULIN 70/30 (70-30) 100 UNIT/ML injection INJECT 32 UNITS INTO THE SKIN 2 TIMES DAILY WITH A MEAL. (Patient taking differently: Inject 32 Units into the skin daily. INJECT 32 UNITS INTO THE SKIN 2 TIMES DAILY WITH A MEAL.) 60 mL 3  . lisinopril (PRINIVIL,ZESTRIL) 20 MG tablet Take 1 tablet (20 mg total) by mouth daily. 90 tablet 3  . metFORMIN (GLUCOPHAGE) 1000 MG tablet Take 1 tablet (1,000 mg total) by mouth 2 (two) times daily with a meal. (Patient not taking: Reported on 02/08/2018) 120 tablet 4  . metoprolol tartrate (LOPRESSOR) 100 MG tablet Take 1 tablet (100 mg total) by mouth 2 (two) times daily. 60 tablet 4  . ONETOUCH DELICA LANCETS 00Q MISC Use as instructed twice daily, check fasting sugar in morning and check before bedtime. E11.8 (Patient not taking: Reported on 02/08/2018) 100 each 12  . rosuvastatin (CRESTOR) 40 MG tablet Take 1 tablet (40 mg total) by mouth daily. To lower cholesterol 90 tablet 3  . sildenafil (VIAGRA) 100 MG tablet TAKE 1 TABLET BY MOUTH DAILY AS NEEDED FOR ERECTILE DYSFUNCTION 10 tablet 3  . Vitamin D, Ergocalciferol, (DRISDOL) 50000 units CAPS capsule Take 1 capsule (50,000 Units total) by mouth every 7 (seven) days. (Patient not  taking: Reported on 02/08/2018) 12 capsule 0   No current facility-administered medications for this visit.     Allergies:   Patient has no known allergies.    Social History:  The patient  reports that he has quit smoking. His smoking use included cigarettes. He has never used smokeless tobacco. He reports that he does not drink alcohol or use drugs.   Family History:  The patient's family history includes Alcohol abuse in his father; CAD in his father; CAD (age of onset: 40) in his brother; Diabetes in his mother; Hypertension in his father.    ROS:  Please see the history  of present illness.   Otherwise, review of systems are positive for none.   All other systems are reviewed and negative.    PHYSICAL EXAM: VS:  BP (!) 154/94 (BP Location: Left Arm, Patient Position: Sitting, Cuff Size: Normal)   Pulse 73   Ht 5' 7.5" (1.715 m)   Wt 193 lb (87.5 kg)   BMI 29.78 kg/m  , BMI Body mass index is 29.78 kg/m. GENERAL:  Well appearing HEENT:  Pupils equal round and reactive, fundi not visualized, oral mucosa unremarkable NECK:  No jugular venous distention, waveform within normal limits, carotid upstroke brisk and symmetric, no bruits, no thyromegaly LYMPHATICS:  No cervical, inguinal adenopathy LUNGS:  Clear to auscultation bilaterally BACK:  No CVA tenderness CHEST:  Unremarkable HEART:  PMI not displaced or sustained,S1 and S2 within normal limits, no S3, no S4, no clicks, no rubs, 3 out of 6 apical blowing systolic mid peaking murmur radiating to the axilla and out the aortic outflow tract, no diastolic murmurs ABD:  Flat, positive bowel sounds normal in frequency in pitch, no bruits, no rebound, no guarding, no midline pulsatile mass, no hepatomegaly, no splenomegaly EXT:  2 plus pulses throughout, trace nonpitting bilateral lower extremity edema, no cyanosis no clubbing, he does have contractures, healing wound on his left second toe SKIN:  No rashes no nodules NEURO:  Cranial nerves II through XII grossly intact, motor grossly intact throughout PSYCH:  Cognitively intact, oriented to person place and time    EKG:  EKG is ordered today. The ekg ordered today demonstrates sinus rhythm, rate 73, axis within normal limits, intervals within normal limits, no acute ST-T wave changes.   Recent Labs: 09/28/2017: Hemoglobin 15.1; Platelets 243 02/08/2018: ALT 24; BUN 14; Creatinine, Ser 0.90; Potassium 4.7; Sodium 138    Lipid Panel    Component Value Date/Time   CHOL 206 (H) 09/28/2017 1113   TRIG 346 (H) 09/28/2017 1113   HDL 43 09/28/2017 1113    CHOLHDL 4.8 09/28/2017 1113   CHOLHDL 3.9 04/02/2013 1632   VLDL 66 (H) 04/02/2013 1632   LDLCALC 94 09/28/2017 1113      Wt Readings from Last 3 Encounters:  02/22/18 193 lb (87.5 kg)  02/08/18 189 lb 9.6 oz (86 kg)  09/28/17 186 lb (84.4 kg)      Other studies Reviewed: Additional studies/ records that were reviewed today include:    Echo 2011. Review of the above records demonstrates:  Please see elsewhere in the note.     ASSESSMENT AND PLAN:  MURMUR:   I suspect that this is aortic stenosis although there is radiation to the axilla.  This could be resonance harmonics.  He needs an echocardiogram.  Regardless he is not having any symptoms I suspect we will follow this clinically.  DIABETES:   He is having this followed by his  primary provider and his A1c was 7.7.  HTN: Blood pressure is not at target and has not been at multiple visits.  Him to increase his lisinopril 20 mg daily.  DYSLIPIDEMIA: He is not at target with this.  His LDL was 94.  Him and increase his Crestor to 40 mg daily and get a lipid and liver in about 8 weeks.  CONTRACTURES: I have asked him to consider an orthopedics appointment given the contractures in his hands.   Current medicines are reviewed at length with the patient today.  The patient does not have concerns regarding medicines.  The following changes have been made:  As above  Labs/ tests ordered today include:   Orders Placed This Encounter  Procedures  . Lipid panel  . Hepatic function panel  . EKG 12-Lead  . ECHOCARDIOGRAM COMPLETE     Disposition:   FU with me in one year or sooner if needed.     Signed, Minus Breeding, MD  02/22/2018 3:24 PM    [facility]

## 2018-02-22 ENCOUNTER — Encounter: Payer: Self-pay | Admitting: Cardiology

## 2018-02-22 ENCOUNTER — Ambulatory Visit: Payer: Self-pay | Admitting: Podiatry

## 2018-02-22 ENCOUNTER — Ambulatory Visit (INDEPENDENT_AMBULATORY_CARE_PROVIDER_SITE_OTHER): Payer: Medicare HMO | Admitting: Cardiology

## 2018-02-22 VITALS — BP 154/94 | HR 73 | Ht 67.5 in | Wt 193.0 lb

## 2018-02-22 DIAGNOSIS — E7800 Pure hypercholesterolemia, unspecified: Secondary | ICD-10-CM | POA: Diagnosis not present

## 2018-02-22 DIAGNOSIS — E785 Hyperlipidemia, unspecified: Secondary | ICD-10-CM

## 2018-02-22 DIAGNOSIS — I10 Essential (primary) hypertension: Secondary | ICD-10-CM | POA: Diagnosis not present

## 2018-02-22 DIAGNOSIS — R011 Cardiac murmur, unspecified: Secondary | ICD-10-CM | POA: Diagnosis not present

## 2018-02-22 DIAGNOSIS — Z794 Long term (current) use of insulin: Secondary | ICD-10-CM | POA: Diagnosis not present

## 2018-02-22 DIAGNOSIS — E119 Type 2 diabetes mellitus without complications: Secondary | ICD-10-CM | POA: Diagnosis not present

## 2018-02-22 MED ORDER — ROSUVASTATIN CALCIUM 40 MG PO TABS: 40.0000 mg | ORAL_TABLET | Freq: Every day | ORAL | 3 refills | Status: DC

## 2018-02-22 MED ORDER — LISINOPRIL 20 MG PO TABS: 20.0000 mg | ORAL_TABLET | Freq: Every day | ORAL | 3 refills | Status: DC

## 2018-02-22 NOTE — Patient Instructions (Signed)
Medication Instructions:  INCREASE- Crestor 40 mg daily INCREASE- Lisinopril 20 mg daily  If you need a refill on your cardiac medications before your next appointment, please call your pharmacy.  Labwork: Fasting Lipids and Liver in 8 weeks  If you have labs (blood work) drawn today and your tests are completely normal, you will receive your results only by: Marland Kitchen MyChart Message (if you have MyChart) OR . A paper copy in the mail If you have any lab test that is abnormal or we need to change your treatment, we will call you to review the results.  Testing/Procedures: Your physician has requested that you have an echocardiogram. Echocardiography is a painless test that uses sound waves to create images of your heart. It provides your doctor with information about the size and shape of your heart and how well your heart's chambers and valves are working. This procedure takes approximately one hour. There are no restrictions for this procedure.  Follow-Up: You will need a follow up appointment in 1 Year.  Please call our office 2 months in [phone]) to schedule the (1 Year) appointment.  You may see  DR Percival Spanish, or one of the following Advanced Practice Providers on your designated Care Team:    . Jory Sims, DNP, ANP . Rhonda Barrett, PA-C  . Kerin Ransom, [place]  . Almyra Deforest, PA-C . Fabian Sharp, PA-C  At [facility], you and your health needs are our priority.  As part of our continuing mission to provide you with exceptional heart care, we have created designated Provider Care Teams.  These Care Teams include your primary Cardiologist (physician) and Advanced Practice Providers (APPs -  Physician Assistants and Nurse Practitioners) who all work together to provide you with the care you need, when you need it.   Thank you for choosing [facility] at [facility]!!

## 2018-02-23 ENCOUNTER — Telehealth: Payer: Self-pay | Admitting: *Deleted

## 2018-02-23 NOTE — Telephone Encounter (Signed)
Medical Assistant left message on patient's home and cell voicemail. Voicemail states to give a call back to Arlie Riker with [facility] at [phone].  

## 2018-02-23 NOTE — Telephone Encounter (Signed)
-----   Message from Antony Blackbird, MD sent at 02/09/2018 12:52 PM EST ----- Please notify patient that his glucose was 174 and alkaline phosphatase (a non-specific liver enzyme) was 161 which is decreased from 183 in July. The urine creatinine/microalbumin test was normal. PSA (prostate specific antigen test was also normal)

## 2018-03-01 ENCOUNTER — Ambulatory Visit (INDEPENDENT_AMBULATORY_CARE_PROVIDER_SITE_OTHER): Payer: Medicare HMO

## 2018-03-01 ENCOUNTER — Ambulatory Visit: Payer: Medicare HMO | Admitting: Podiatry

## 2018-03-01 ENCOUNTER — Encounter: Payer: Medicare HMO | Admitting: Pharmacist

## 2018-03-01 ENCOUNTER — Encounter: Payer: Self-pay | Admitting: Podiatry

## 2018-03-01 VITALS — BP 132/76

## 2018-03-01 DIAGNOSIS — M79675 Pain in left toe(s): Secondary | ICD-10-CM | POA: Diagnosis not present

## 2018-03-01 DIAGNOSIS — L02619 Cutaneous abscess of unspecified foot: Secondary | ICD-10-CM

## 2018-03-01 DIAGNOSIS — L02632 Carbuncle of left foot: Secondary | ICD-10-CM | POA: Diagnosis not present

## 2018-03-01 DIAGNOSIS — M79674 Pain in right toe(s): Secondary | ICD-10-CM | POA: Diagnosis not present

## 2018-03-01 DIAGNOSIS — E1142 Type 2 diabetes mellitus with diabetic polyneuropathy: Secondary | ICD-10-CM

## 2018-03-01 DIAGNOSIS — B351 Tinea unguium: Secondary | ICD-10-CM

## 2018-03-01 DIAGNOSIS — M869 Osteomyelitis, unspecified: Secondary | ICD-10-CM | POA: Diagnosis not present

## 2018-03-01 DIAGNOSIS — L03119 Cellulitis of unspecified part of limb: Secondary | ICD-10-CM | POA: Diagnosis not present

## 2018-03-01 NOTE — Progress Notes (Signed)
Subjective: Caleb [name] presents today for diabetic foot evaluation with painful, thick toenails 1-5 b/l that he cannot cut and which interfere with daily activities. Duration is longstanding. Pain is aggravated when wearing enclosed shoe gear.  Pt states he injured his toe back in June and was given antibiotics by a physician. He has had no follow up for the digit since then. He denies any pain to the area.   PCP: Last DOS was 02/08/2018 with Dr. Antony Blackbird. Past Medical History:  Diagnosis Date  . Diabetes mellitus without complication ([place])   . Dyslipidemia 09/27/2012  . Erectile dysfunction 09/27/2012  . Hypercholesteremia   . Hyperlipidemia 08/12/2012  . Hypertension   . Hyponatremia 08/14/2012  . Pancreatitis   . Pancreatitis, acute 09/27/2012  . Vitamin D deficiency 06/23/2015   Patient Active Problem List   Diagnosis Date Noted  . Cardiac murmur 02/22/2018  . Vitamin D deficiency 06/23/2015  . Pancreatitis, acute 09/27/2012  . Dyslipidemia 09/27/2012  . Erectile dysfunction 09/27/2012  . Unspecified essential hypertension 09/27/2012  . Obesity, unspecified 08/14/2012  . Hyponatremia 08/14/2012  . Acute pancreatitis 08/12/2012  . Diabetes mellitus ([place]) 08/12/2012  . Hypertension 08/12/2012  . Hyperlipidemia 08/12/2012  . Metabolic acidosis 67/61/9509   Past Surgical History:  Procedure Laterality Date  . KNEE SURGERY      Current Outpatient Medications:  .  amLODipine (NORVASC) 5 MG tablet, Take 1 tablet (5 mg total) by mouth daily., Disp: 90 tablet, Rfl: 3 .  Blood Glucose Monitoring Suppl (ONETOUCH VERIO) w/Device KIT, 1 each by Does not apply route 2 (two) times daily. Use as instructed twice daily, check fasting sugar in morning and check before bedtime. E11.8, Disp: 1 kit, Rfl: 0 .  gabapentin (NEURONTIN) 300 MG capsule, Take 1 capsule (300 mg total) by mouth 3 (three) times daily., Disp: 180 capsule, Rfl: 3 .  glucose blood test strip, Use as instructed twice  daily, check fasting sugar in morning and check before bedtime. E11.8, Disp: 100 each, Rfl: 12 .  HUMULIN 70/30 (70-30) 100 UNIT/ML injection, INJECT 32 UNITS INTO THE SKIN 2 TIMES DAILY WITH A MEAL. (Patient taking differently: Inject 32 Units into the skin daily. INJECT 32 UNITS INTO THE SKIN 2 TIMES DAILY WITH A MEAL.), Disp: 60 mL, Rfl: 3 .  lisinopril (PRINIVIL,ZESTRIL) 20 MG tablet, Take 1 tablet (20 mg total) by mouth daily., Disp: 90 tablet, Rfl: 3 .  metFORMIN (GLUCOPHAGE) 1000 MG tablet, Take 1 tablet (1,000 mg total) by mouth 2 (two) times daily with a meal., Disp: 120 tablet, Rfl: 4 .  metoprolol tartrate (LOPRESSOR) 100 MG tablet, Take 1 tablet (100 mg total) by mouth 2 (two) times daily., Disp: 60 tablet, Rfl: 4 .  ONETOUCH DELICA LANCETS 32I MISC, Use as instructed twice daily, check fasting sugar in morning and check before bedtime. E11.8, Disp: 100 each, Rfl: 12 .  rosuvastatin (CRESTOR) 40 MG tablet, Take 1 tablet (40 mg total) by mouth daily. To lower cholesterol, Disp: 90 tablet, Rfl: 3 .  sildenafil (VIAGRA) 100 MG tablet, TAKE 1 TABLET BY MOUTH DAILY AS NEEDED FOR ERECTILE DYSFUNCTION, Disp: 10 tablet, Rfl: 3 .  Vitamin D, Ergocalciferol, (DRISDOL) 50000 units CAPS capsule, Take 1 capsule (50,000 Units total) by mouth every 7 (seven) days., Disp: 12 capsule, Rfl: 0  No Known Allergies   Social History   Occupational History  . Occupation: Caregiver    Comment: Special needs  Tobacco Use  . Smoking status: Former Smoker  Types: Cigarettes  . Smokeless tobacco: Never Used  Substance and Sexual Activity  . Alcohol use: No  . Drug use: No  . Sexual activity: Not Currently   Family History  Problem Relation Age of Onset  . CAD Father   . Hypertension Father   . Alcohol abuse Father        Cause of death  . Diabetes Mother   . CAD Brother 77       CABG   Immunization History  Administered Date(s) Administered  . Pneumococcal Conjugate-13 09/13/2016  .  Pneumococcal Polysaccharide-23 08/15/2012  . Tdap 06/06/2016   Objective:  Vascular Examination: Capillary refill time immediate x 10 digits Dorsalis pedis and Posterior tibial pulses palpable b/l Digital hair present x 10 digits Skin temperature gradient WNL b/l  Dermatological Examination: Skin with normal turgor, texture and tone b/l  Toenails 1-5 b/l discolored, thick, dystrophic with subungual debris and pain with palpation to nailbeds due to thickness of nails.  Left 2nd digit with exuberant amount of hyperkeratotic tissue at distal tip.  No underlying wound, but does have bulbous appearance at distal phalanx. Slight tenderness to palpation.  Musculoskeletal: Muscle strength 5/5 to all LE muscle groups  Neurological: Sensation intact with 10 gram monofilament. Vibratory sensation intact.  Xray left foot: Questionable osteolytic process left 2nd digit  Assessment: 1. Painful onychomycosis toenails 1-5 b/l  2. Suspected low grade infection lesser digit left foot 2. NIDDM  Plan: 1. Discussed diabetic foot care principles. Literature dispensed on today. 2. Toenails 1-5 b/l were debrided in length and girth without iatrogenic bleeding. 3. Xray performed and discussed left foot. 4. Discussed MRI is needed to rule out chronic infectious process. Will get approval for MRI. 5. Patient to continue soft, supportive shoe gear 6. Patient to report any pedal injuries to medical professional immediately. 7. Follow up 3 months. Patient/POA to call should there be a concern in the interim.

## 2018-03-01 NOTE — Patient Instructions (Addendum)
Diabetes and Foot Care Diabetes may cause you to have problems because of poor blood supply (circulation) to your feet and legs. This may cause the skin on your feet to become thinner, break easier, and heal more slowly. Your skin may become dry, and the skin may peel and crack. You may also have nerve damage in your legs and feet causing decreased feeling in them. You may not notice minor injuries to your feet that could lead to infections or more serious problems. Taking care of your feet is one of the most important things you can do for yourself. Follow these instructions at home:  Wear shoes at all times, even in the house. Do not go barefoot. Bare feet are easily injured.  Check your feet daily for blisters, cuts, and redness. If you cannot see the bottom of your feet, use a mirror or ask someone for help.  Wash your feet with warm water (do not use hot water) and mild soap. Then pat your feet and the areas between your toes until they are completely dry. Do not soak your feet as this can dry your skin.  Apply a moisturizing lotion or petroleum jelly (that does not contain alcohol and is unscented) to the skin on your feet and to dry, brittle toenails. Do not apply lotion between your toes.  Trim your toenails straight across. Do not dig under them or around the cuticle. File the edges of your nails with an emery board or nail file.  Do not cut corns or calluses or try to remove them with medicine.  Wear clean socks or stockings every day. Make sure they are not too tight. Do not wear knee-high stockings since they may decrease blood flow to your legs.  Wear shoes that fit properly and have enough cushioning. To break in new shoes, wear them for just a few hours a day. This prevents you from injuring your feet. Always look in your shoes before you put them on to be sure there are no objects inside.  Do not cross your legs. This may decrease the blood flow to your feet.  If you find a  minor scrape, cut, or break in the skin on your feet, keep it and the skin around it clean and dry. These areas may be cleansed with mild soap and water. Do not cleanse the area with peroxide, alcohol, or iodine.  When you remove an adhesive bandage, be sure not to damage the skin around it.  If you have a wound, look at it several times a day to make sure it is healing.  Do not use heating pads or hot water bottles. They may burn your skin. If you have lost feeling in your feet or legs, you may not know it is happening until it is too late.  Make sure your health care provider performs a complete foot exam at least annually or more often if you have foot problems. Report any cuts, sores, or bruises to your health care provider immediately. Contact a health care provider if:  You have an injury that is not healing.  You have cuts or breaks in the skin.  You have an ingrown nail.  You notice redness on your legs or feet.  You feel burning or tingling in your legs or feet.  You have pain or cramps in your legs and feet.  Your legs or feet are numb.  Your feet always feel cold. Get help right away if:  There is increasing   redness, swelling, or pain in or around a wound.  There is a red line that goes up your leg.  Pus is coming from a wound.  You develop a fever or as directed by your health care provider.  You notice a bad smell coming from an ulcer or wound. This information is not intended to replace advice given to you by your health care provider. Make sure you discuss any questions you have with your health care provider. Document Released: 03/04/2000 Document Revised: 08/13/2015 Document Reviewed: 08/14/2012 Elsevier Interactive Patient Education  2017 [employer]  Diabetic Neuropathy Diabetic neuropathy is a nerve disease or nerve damage that is caused by diabetes mellitus. About half of all people with diabetes mellitus have some form of nerve damage. Nerve damage  is more common in those who have had diabetes mellitus for many years and who generally have not had good control of their blood sugar (glucose) level. Diabetic neuropathy is a common complication of diabetes mellitus. There are three common types of diabetic neuropathy and a fourth type that is less common and less understood:  Peripheral neuropathy-This is the most common type of diabetic neuropathy. It causes damage to the nerves of the feet and legs first and then eventually the hands and arms. The damage affects the ability to sense touch.  Autonomic neuropathy-This type causes damage to the autonomic nervous system, which controls the following functions: ? Heartbeat. ? Body temperature. ? Blood pressure. ? Urination. ? Digestion. ? Sweating. ? Sexual function.  Focal neuropathy-Focal neuropathy can be painful and unpredictable and occurs most often in older adults with diabetes mellitus. It involves a specific nerve or one area and often comes on suddenly. It usually does not cause long-term problems.  Radiculoplexus neuropathy- Sometimes called lumbosacral radiculoplexus neuropathy, radiculoplexus neuropathy affects the nerves of the thighs, hips, buttocks, or legs. It is more common in people with type 2 diabetes mellitus and in older men. It is characterized by debilitating pain, weakness, and atrophy, usually in the thigh muscles.  What are the causes? The cause of peripheral, autonomic, and focal neuropathies is diabetes mellitus that is uncontrolled and high glucose levels. The cause of radiculoplexus neuropathy is unknown. However, it is thought to be caused by inflammation related to uncontrolled glucose levels. What are the signs or symptoms? Peripheral Neuropathy Peripheral neuropathy develops slowly over time. When the nerves of the feet and legs no longer work there may be:  Burning, stabbing, or aching pain in the legs or feet.  Inability to feel pressure or pain in your  feet. This can lead to: ? Thick calluses over pressure areas. ? Pressure sores. ? Ulcers.  Foot deformities.  Reduced ability to feel temperature changes.  Muscle weakness.  Autonomic Neuropathy The symptoms of autonomic neuropathy vary depending on which nerves are affected. Symptoms may include:  Problems with digestion, such as: ? Feeling sick to your stomach (nausea). ? Vomiting. ? Bloating. ? Constipation. ? Diarrhea. ? Abdominal pain.  Difficulty with urination. This occurs if you lose your ability to sense when your bladder is full. Problems include: ? Urine leakage (incontinence). ? Inability to empty your bladder completely (retention).  Rapid or irregular heartbeat (palpitations).  Blood pressure drops when you stand up (orthostatic hypotension). When you stand up you may feel: ? Dizzy. ? Weak. ? Faint.  In men, inability to attain and maintain an erection.  In women, vaginal dryness and problems with decreased sexual desire and arousal.  Problems with body temperature   regulation.  Increased or decreased sweating.  Focal Neuropathy  Abnormal eye movements or abnormal alignment of both eyes.  Weakness in the wrist.  Foot drop. This results in an inability to lift the foot properly and abnormal walking or foot movement.  Paralysis on one side of your face (Bell palsy).  Chest or abdominal pain. Radiculoplexus Neuropathy  Sudden, severe pain in your hip, thigh, or buttocks.  Weakness and wasting of thigh muscles.  Difficulty rising from a seated position.  Abdominal swelling.  Unexplained weight loss (usually more than 10 lb [4.5 kg]). How is this diagnosed? Peripheral Neuropathy Your senses may be tested. Sensory function testing can be done with:  A light touch using a monofilament.  A vibration with tuning fork.  A sharp sensation with a pin prick.  Other tests that can help diagnose neuropathy are:  Nerve conduction velocity. This  test checks the transmission of an electrical current through a nerve.  Electromyography. This shows how muscles respond to electrical signals transmitted by nearby nerves.  Quantitative sensory testing. This is used to assess how your nerves respond to vibrations and changes in temperature.  Autonomic Neuropathy Diagnosis is often based on reported symptoms. Tell your health care provider if you experience:  Dizziness.  Constipation.  Diarrhea.  Inappropriate urination or inability to urinate.  Inability to get or maintain an erection.  Tests that may be done include:  Electrocardiography or Holter monitor. These are tests that can help show problems with the heart rate or heart rhythm.  An X-ray exam may be done.  Focal Neuropathy Diagnosis is made based on your symptoms and what your health care provider finds during your exam. Other tests may be done. They may include:  Nerve conduction velocities. This checks the transmission of electrical current through a nerve.  Electromyography. This shows how muscles respond to electrical signals transmitted by nearby nerves.  Quantitative sensory testing. This test is used to assess how your nerves respond to vibration and changes in temperature.  Radiculoplexus Neuropathy  Often the first thing is to eliminate any other issue or problems that might be the cause, as there is no standard test for diagnosis.  X-ray exam of your spine and lumbar region.  Spinal tap to rule out cancer.  MRI to rule out other lesions. How is this treated? Once nerve damage occurs, it cannot be reversed. The goal of treatment is to keep the disease or nerve damage from getting worse and affecting more nerve fibers. Controlling your blood glucose level is the key. Most people with radiculoplexus neuropathy see at least a partial improvement over time. You will need to keep your blood glucose and HbA1c levels in the target range determined by your  health care provider. Things that help control blood glucose levels include:  Blood glucose monitoring.  Meal planning.  Physical activity.  Diabetes medicine.  Over time, maintaining lower blood glucose levels helps lessen symptoms. Sometimes, prescription pain medicine is needed. Follow these instructions at home:  Do not smoke.  Keep your blood glucose level in the range that you and your health care provider have determined acceptable for you.  Keep your blood pressure level in the range that you and your health care provider have determined acceptable for you.  Eat a well-balanced diet.  Be physically active every day. Include strength training and balance exercises.  Protect your feet. ? Check your feet every day for sores, cuts, blisters, or signs of infection. ? Wear padded socks   and supportive shoes. Use orthotic inserts, if necessary. ? Regularly check the insides of your shoes for worn spots. Make sure there are no rocks or other items inside your shoes before you put them on. Contact a health care provider if:  You have burning, stabbing, or aching pain in the legs or feet.  You are unable to feel pressure or pain in your feet.  You develop problems with digestion such as: ? Nausea. ? Vomiting. ? Bloating. ? Constipation. ? Diarrhea. ? Abdominal pain.  You have difficulty with urination, such as: ? Incontinence. ? Retention.  You have palpitations.  You develop orthostatic hypotension. When you stand up you may feel: ? Dizzy. ? Weak. ? Faint.  You cannot attain and maintain an erection (in men).  You have vaginal dryness and problems with decreased sexual desire and arousal (in women).  You have severe pain in your thighs, legs, or buttocks.  You have unexplained weight loss. This information is not intended to replace advice given to you by your health care provider. Make sure you discuss any questions you have with your health care  provider. Document Released: 05/16/2001 Document Revised: 08/13/2015 Document Reviewed: 08/16/2012 Elsevier Interactive Patient Education  2017 Elsevier Inc.  

## 2018-03-02 ENCOUNTER — Telehealth: Payer: Self-pay | Admitting: *Deleted

## 2018-03-02 ENCOUNTER — Other Ambulatory Visit (HOSPITAL_COMMUNITY): Payer: Medicare HMO

## 2018-03-02 DIAGNOSIS — R0989 Other specified symptoms and signs involving the circulatory and respiratory systems: Secondary | ICD-10-CM

## 2018-03-02 DIAGNOSIS — L03119 Cellulitis of unspecified part of limb: Principal | ICD-10-CM

## 2018-03-02 DIAGNOSIS — L02619 Cutaneous abscess of unspecified foot: Secondary | ICD-10-CM

## 2018-03-02 NOTE — Telephone Encounter (Signed)
Orders to Gretta Arab, RN to pre-cert, and faxed to [facility].

## 2018-03-07 ENCOUNTER — Ambulatory Visit: Payer: Medicare HMO | Attending: Family Medicine | Admitting: Pharmacist

## 2018-03-07 VITALS — BP 133/81 | HR 76

## 2018-03-07 DIAGNOSIS — E119 Type 2 diabetes mellitus without complications: Secondary | ICD-10-CM | POA: Insufficient documentation

## 2018-03-07 DIAGNOSIS — Z833 Family history of diabetes mellitus: Secondary | ICD-10-CM | POA: Insufficient documentation

## 2018-03-07 DIAGNOSIS — Z8249 Family history of ischemic heart disease and other diseases of the circulatory system: Secondary | ICD-10-CM | POA: Insufficient documentation

## 2018-03-07 DIAGNOSIS — Z79899 Other long term (current) drug therapy: Secondary | ICD-10-CM | POA: Insufficient documentation

## 2018-03-07 DIAGNOSIS — I10 Essential (primary) hypertension: Secondary | ICD-10-CM

## 2018-03-07 NOTE — Patient Instructions (Signed)
Thank you for coming to see us today.   Blood pressure today is improved.   Continue taking blood pressure medications as prescribed.   Limiting salt and caffeine, as well as exercising as able for at least 30 minutes for 5 days out of the week, can also help you lower your blood pressure.  Take your blood pressure at home if you are able. Please write down these numbers and bring them to your visits.  If you have any questions about medications, please call me [phone].  Luke  

## 2018-03-07 NOTE — Progress Notes (Signed)
   S:   PCP: Dr. Margarita Rana  Patient arrives in good spirits. Presents to the clinic for hypertension management. Patient was referred by Dr. Chapman Fitch on 02/08/2018. Saw cardiologist 02/22/18. Lisinopril increased at that visit. Since then, pt seen by DPM and BP at that visit 132/76.  Today, he denies chest pain, shortness of breath, headache, or blurred vision  Patient reports adherence with medications.  Current BP Medications include:  Amlodipine 5 mg daily, lisinopril 20 mg daily, and metoprolol 100 mg BID  Dietary habits include: does not limit salt, denies drinking caffeine  Exercise habits include: reports 1.5 hrs in the gym daily Family / Social history: CAD (father, brother), DM (mother), amd HTN (father), never smoker, rarely drinks alcohol (2-3 drinks/month)  Home BP readings: does not take at home  O:  L arm after 5 minutes rest: 133/81, HR 76 Last 3 Office BP readings: BP Readings from Last 3 Encounters:  03/01/18 132/76  02/22/18 (!) 154/94  02/08/18 (!) 151/90   BMET    Component Value Date/Time   NA 138 02/08/2018 1635   K 4.7 02/08/2018 1635   CL 98 02/08/2018 1635   CO2 24 02/08/2018 1635   GLUCOSE 174 (H) 02/08/2018 1635   GLUCOSE 250 (H) 06/06/2016 1455   BUN 14 02/08/2018 1635   CREATININE 0.90 02/08/2018 1635   CREATININE 0.86 06/06/2016 1455   CALCIUM 9.2 02/08/2018 1635   GFRNONAA 88 02/08/2018 1635   GFRNONAA 87 04/02/2013 1632   GFRAA 102 02/08/2018 1635   GFRAA >89 04/02/2013 1632    Renal function: CrCl cannot be calculated (Patient's most recent lab result is older than the maximum 21 days allowed.).  Clinical ASCVD: No  The 10-year ASCVD risk score Mikey Bussing DC Jr., et al., 2013) is: 33.2%   Values used to calculate the score:     Age: 67 years     Sex: Male     Is Non-Hispanic African American: Yes     Diabetic: Yes     Tobacco smoker: No     Systolic Blood Pressure: 888 mmHg     Is BP treated: Yes     HDL Cholesterol: 43 mg/dL     Total  Cholesterol: 206 mg/dL  A/P: Hypertension longstanding currently improved on current medications. BP Goal <130/80 mmHg. Patient is adherent with current medications. He is only very slightly above goal but did recently increase lisinopril ~ 2 weeks ago. Will continue current regimen for now. His ASCVD risk is very high. Encouraged compliance with Crestor.   -Continued amlodipine 5 mg, lisinopril 20 mg, and metoprolol 100 mg BID.  -Counseled on lifestyle modifications for blood pressure control including reduced dietary sodium, increased exercise, adequate sleep  Results reviewed and written information provided. Total time in face-to-face counseling 15 minutes.   F/U Clinic Visit w/ Dr. Chapman Fitch 06/13/17.  Benard Halsted, PharmD, [place] [phone]

## 2018-03-08 ENCOUNTER — Encounter: Payer: Self-pay | Admitting: Pharmacist

## 2018-03-09 ENCOUNTER — Telehealth: Payer: Self-pay | Admitting: Podiatry

## 2018-03-09 ENCOUNTER — Encounter: Payer: Self-pay | Admitting: Podiatry

## 2018-03-09 NOTE — Telephone Encounter (Signed)
[facility] requires clinicals for pre-cert review. I informed Dr. Elisha Ponder clinicals of 03/01/2018 were need for prior authorization.

## 2018-03-09 NOTE — Telephone Encounter (Signed)
Left message informing [facility] Dr. Elisha Ponder would be performing the Pre-cert today but may not be approved before 4:00pm and may need to be rescheduled.

## 2018-03-09 NOTE — Telephone Encounter (Signed)
Caleb [name] states it take 45-60 minutes for case to populate.

## 2018-03-09 NOTE — Telephone Encounter (Signed)
[facility] calling following up on authorization for pts MRI scheduled for Sunday. Needing authorization before 4 today.

## 2018-03-11 ENCOUNTER — Ambulatory Visit
Admission: RE | Admit: 2018-03-11 | Discharge: 2018-03-11 | Disposition: A | Discharge status: Home / Self Care | Payer: Medicare HMO | Source: Ambulatory Visit | Attending: Podiatry | Admitting: Podiatry

## 2018-03-11 DIAGNOSIS — L03116 Cellulitis of left lower limb: Secondary | ICD-10-CM | POA: Diagnosis not present

## 2018-03-11 DIAGNOSIS — L03119 Cellulitis of unspecified part of limb: Principal | ICD-10-CM

## 2018-03-11 DIAGNOSIS — L02619 Cutaneous abscess of unspecified foot: Secondary | ICD-10-CM

## 2018-03-11 MED ORDER — GADOBENATE DIMEGLUMINE 529 MG/ML IV SOLN
15.0000 mL | Freq: Once | INTRAVENOUS | Status: AC | PRN
  Administered 2018-03-11: 15 mL via INTRAVENOUS

## 2018-03-12 ENCOUNTER — Telehealth: Payer: Self-pay | Admitting: *Deleted

## 2018-03-12 NOTE — Telephone Encounter (Signed)
-----   Message from Marzetta Board, DPM sent at 03/12/2018 12:43 PM EST ----- Regarding: Schedule appointment Please call and schedule appointment with [name] to discuss his MRI results.  Thanks!

## 2018-03-15 DIAGNOSIS — E119 Type 2 diabetes mellitus without complications: Secondary | ICD-10-CM | POA: Diagnosis not present

## 2018-03-15 DIAGNOSIS — H25813 Combined forms of age-related cataract, bilateral: Secondary | ICD-10-CM | POA: Diagnosis not present

## 2018-03-15 DIAGNOSIS — H43812 Vitreous degeneration, left eye: Secondary | ICD-10-CM | POA: Diagnosis not present

## 2018-03-15 LAB — HM DIABETES EYE EXAM

## 2018-03-16 ENCOUNTER — Ambulatory Visit (HOSPITAL_COMMUNITY): Payer: Medicare HMO | Attending: Cardiovascular Disease

## 2018-03-16 ENCOUNTER — Other Ambulatory Visit: Payer: Self-pay

## 2018-03-16 ENCOUNTER — Ambulatory Visit: Payer: Medicare HMO | Admitting: Podiatry

## 2018-03-16 DIAGNOSIS — R011 Cardiac murmur, unspecified: Secondary | ICD-10-CM | POA: Diagnosis not present

## 2018-03-16 DIAGNOSIS — L02612 Cutaneous abscess of left foot: Secondary | ICD-10-CM

## 2018-03-16 DIAGNOSIS — L03032 Cellulitis of left toe: Secondary | ICD-10-CM | POA: Diagnosis not present

## 2018-03-16 MED ORDER — DOXYCYCLINE HYCLATE 100 MG PO CAPS: 100.0000 mg | ORAL_CAPSULE | Freq: Two times a day (BID) | ORAL | 0 refills | Status: AC

## 2018-03-16 MED FILL — ROSUVASTATIN CALCIUM 20 MG: 20 | 30 days supply | Qty: 30 | Fill #1

## 2018-03-16 MED FILL — DOXYCYCLINE HYCLATE 100 MG: 100 | 10 days supply | Qty: 20 | Fill #0

## 2018-03-16 MED FILL — GABAPENTIN 300 MG CAPSULE: 300 | 30 days supply | Qty: 90 | Fill #1

## 2018-03-16 MED FILL — LISINOPRIL 10 MG TABS: 10 | 30 days supply | Qty: 30 | Fill #1

## 2018-03-16 NOTE — Patient Instructions (Signed)
Diabetes Mellitus and Foot Care  Foot care is an important part of your health, especially when you have diabetes. Diabetes may cause you to have problems because of poor blood flow (circulation) to your feet and legs, which can cause your skin to:   Become thinner and drier.   Break more easily.   Heal more slowly.   Peel and crack.  You may also have nerve damage (neuropathy) in your legs and feet, causing decreased feeling in them. This means that you may not notice minor injuries to your feet that could lead to more serious problems. Noticing and addressing any potential problems early is the best way to prevent future foot problems.  How to care for your feet  Foot hygiene   Wash your feet daily with warm water and mild soap. Do not use hot water. Then, pat your feet and the areas between your toes until they are completely dry. Do not soak your feet as this can dry your skin.   Trim your toenails straight across. Do not dig under them or around the cuticle. File the edges of your nails with an emery board or nail file.   Apply a moisturizing lotion or petroleum jelly to the skin on your feet and to dry, brittle toenails. Use lotion that does not contain alcohol and is unscented. Do not apply lotion between your toes.  Shoes and socks   Wear clean socks or stockings every day. Make sure they are not too tight. Do not wear knee-high stockings since they may decrease blood flow to your legs.   Wear shoes that fit properly and have enough cushioning. Always look in your shoes before you put them on to be sure there are no objects inside.   To break in new shoes, wear them for just a few hours a day. This prevents injuries on your feet.  Wounds, scrapes, corns, and calluses   Check your feet daily for blisters, cuts, bruises, sores, and redness. If you cannot see the bottom of your feet, use a mirror or ask someone for help.   Do not cut corns or calluses or try to remove them with medicine.   If you  find a minor scrape, cut, or break in the skin on your feet, keep it and the skin around it clean and dry. You may clean these areas with mild soap and water. Do not clean the area with peroxide, alcohol, or iodine.   If you have a wound, scrape, corn, or callus on your foot, look at it several times a day to make sure it is healing and not infected. Check for:  ? Redness, swelling, or pain.  ? Fluid or blood.  ? Warmth.  ? Pus or a bad smell.  General instructions   Do not cross your legs. This may decrease blood flow to your feet.   Do not use heating pads or hot water bottles on your feet. They may burn your skin. If you have lost feeling in your feet or legs, you may not know this is happening until it is too late.   Protect your feet from hot and cold by wearing shoes, such as at the beach or on hot pavement.   Schedule a complete foot exam at least once a year (annually) or more often if you have foot problems. If you have foot problems, report any cuts, sores, or bruises to your health care provider immediately.  Contact a health care provider if:     You have a medical condition that increases your risk of infection and you have any cuts, sores, or bruises on your feet.   You have an injury that is not healing.   You have redness on your legs or feet.   You feel burning or tingling in your legs or feet.   You have pain or cramps in your legs and feet.   Your legs or feet are numb.   Your feet always feel cold.   You have pain around a toenail.  Get help right away if:   You have a wound, scrape, corn, or callus on your foot and:  ? You have pain, swelling, or redness that gets worse.  ? You have fluid or blood coming from the wound, scrape, corn, or callus.  ? Your wound, scrape, corn, or callus feels warm to the touch.  ? You have pus or a bad smell coming from the wound, scrape, corn, or callus.  ? You have a fever.  ? You have a red line going up your leg.  Summary   Check your feet every day  for cuts, sores, red spots, swelling, and blisters.   Moisturize feet and legs daily.   Wear shoes that fit properly and have enough cushioning.   If you have foot problems, report any cuts, sores, or bruises to your health care provider immediately.   Schedule a complete foot exam at least once a year (annually) or more often if you have foot problems.  This information is not intended to replace advice given to you by your health care provider. Make sure you discuss any questions you have with your health care provider.  Document Released: 03/04/2000 Document Revised: 04/19/2017 Document Reviewed: 04/08/2016  Elsevier Interactive Patient Education  2019 Elsevier Inc.

## 2018-03-23 ENCOUNTER — Ambulatory Visit (INDEPENDENT_AMBULATORY_CARE_PROVIDER_SITE_OTHER): Payer: Medicare HMO | Admitting: Podiatry

## 2018-03-23 ENCOUNTER — Encounter: Payer: Self-pay | Admitting: Podiatry

## 2018-03-23 DIAGNOSIS — L03032 Cellulitis of left toe: Secondary | ICD-10-CM

## 2018-03-23 DIAGNOSIS — L02612 Cutaneous abscess of left foot: Secondary | ICD-10-CM

## 2018-03-23 NOTE — Patient Instructions (Signed)
Diabetes Mellitus and Foot Care  Foot care is an important part of your health, especially when you have diabetes. Diabetes may cause you to have problems because of poor blood flow (circulation) to your feet and legs, which can cause your skin to:   Become thinner and drier.   Break more easily.   Heal more slowly.   Peel and crack.  You may also have nerve damage (neuropathy) in your legs and feet, causing decreased feeling in them. This means that you may not notice minor injuries to your feet that could lead to more serious problems. Noticing and addressing any potential problems early is the best way to prevent future foot problems.  How to care for your feet  Foot hygiene   Wash your feet daily with warm water and mild soap. Do not use hot water. Then, pat your feet and the areas between your toes until they are completely dry. Do not soak your feet as this can dry your skin.   Trim your toenails straight across. Do not dig under them or around the cuticle. File the edges of your nails with an emery board or nail file.   Apply a moisturizing lotion or petroleum jelly to the skin on your feet and to dry, brittle toenails. Use lotion that does not contain alcohol and is unscented. Do not apply lotion between your toes.  Shoes and socks   Wear clean socks or stockings every day. Make sure they are not too tight. Do not wear knee-high stockings since they may decrease blood flow to your legs.   Wear shoes that fit properly and have enough cushioning. Always look in your shoes before you put them on to be sure there are no objects inside.   To break in new shoes, wear them for just a few hours a day. This prevents injuries on your feet.  Wounds, scrapes, corns, and calluses   Check your feet daily for blisters, cuts, bruises, sores, and redness. If you cannot see the bottom of your feet, use a mirror or ask someone for help.   Do not cut corns or calluses or try to remove them with medicine.   If you  find a minor scrape, cut, or break in the skin on your feet, keep it and the skin around it clean and dry. You may clean these areas with mild soap and water. Do not clean the area with peroxide, alcohol, or iodine.   If you have a wound, scrape, corn, or callus on your foot, look at it several times a day to make sure it is healing and not infected. Check for:  ? Redness, swelling, or pain.  ? Fluid or blood.  ? Warmth.  ? Pus or a bad smell.  General instructions   Do not cross your legs. This may decrease blood flow to your feet.   Do not use heating pads or hot water bottles on your feet. They may burn your skin. If you have lost feeling in your feet or legs, you may not know this is happening until it is too late.   Protect your feet from hot and cold by wearing shoes, such as at the beach or on hot pavement.   Schedule a complete foot exam at least once a year (annually) or more often if you have foot problems. If you have foot problems, report any cuts, sores, or bruises to your health care provider immediately.  Contact a health care provider if:     You have a medical condition that increases your risk of infection and you have any cuts, sores, or bruises on your feet.   You have an injury that is not healing.   You have redness on your legs or feet.   You feel burning or tingling in your legs or feet.   You have pain or cramps in your legs and feet.   Your legs or feet are numb.   Your feet always feel cold.   You have pain around a toenail.  Get help right away if:   You have a wound, scrape, corn, or callus on your foot and:  ? You have pain, swelling, or redness that gets worse.  ? You have fluid or blood coming from the wound, scrape, corn, or callus.  ? Your wound, scrape, corn, or callus feels warm to the touch.  ? You have pus or a bad smell coming from the wound, scrape, corn, or callus.  ? You have a fever.  ? You have a red line going up your leg.  Summary   Check your feet every day  for cuts, sores, red spots, swelling, and blisters.   Moisturize feet and legs daily.   Wear shoes that fit properly and have enough cushioning.   If you have foot problems, report any cuts, sores, or bruises to your health care provider immediately.   Schedule a complete foot exam at least once a year (annually) or more often if you have foot problems.  This information is not intended to replace advice given to you by your health care provider. Make sure you discuss any questions you have with your health care provider.  Document Released: 03/04/2000 Document Revised: 04/19/2017 Document Reviewed: 04/08/2016  Elsevier Interactive Patient Education  2019 Elsevier Inc.

## 2018-04-14 ENCOUNTER — Encounter: Payer: Self-pay | Admitting: Podiatry

## 2018-04-14 NOTE — Progress Notes (Signed)
  Subjective:     Patient ID: Caleb [name], male   DOB: [birth year], 68 y.o.   MRN: [id number]  HPI Caleb [name] is here for follow-up of low-grade infection lesser digit left foot osteomyelitis of the left second digit.  He is here to discuss his MRI results.  He states the toe looks much better to him and feels much better as well.  Review of Systems Constitutional: Negative for fever, chills, night sweats, nausea or vomiting.    Objective:   Physical Exam Neurovascular status intact bilaterally  Left second digit noted to be devoid of nail plate.  There is still residual bulbous appearance to the distal phalanx of the left second toe.  Today, there is no tenderness to palpation. CLINICAL DATA:  Pain and swelling of the second toe.   MRI Report from [facility] 03/11/2018 EXAM: MRI OF THE LEFT FOREFOOT WITHOUT AND WITH CONTRAST  TECHNIQUE: Multiplanar, multisequence MR imaging of the left foot was performed both before and after administration of intravenous contrast.  CONTRAST:  20mL MULTIHANCE GADOBENATE DIMEGLUMINE 529 MG/ML IV SOLN  COMPARISON:  Radiographs 03/01/2018  FINDINGS: Examination is limited due to patient motion and very heterogeneous fat saturation.  There is mild T2 signal abnormality in the proximal phalanx of the second toe. I suspect there is also mild enhancement after contrast administration. This could be reactive change or benign osteitis but could not exclude osteomyelitis. I would expect more surrounding soft tissue swelling/edema and enhancement. No MR findings to suggest septic arthritis.  Mild soft tissue swelling/edema involving the subcutaneous tissues of the second toe but no abscess. No findings for myofasciitis or pyomyositis.  IMPRESSION: 1. Limited examination but suspect mild changes of osteomyelitis involving the second proximal phalanx with mild surrounding cellulitis but no abscess, septic arthritis or pyomyositis. 2. The  other bony structures are intact.   Electronically Signed   By: Marijo Sanes M.D.   On: 03/12/2018 07:21     Assessment:  1.  Suspected low-grade infection second digit right foot 2.  NIDDM Plan:  1.  Reviewed MRI results with patient on today.  MRI results suggested he may have osteomyelitis involving the second second proximal phalanx.  His injury was more of a jamming of the digit.  Clinically, it appears to be resolving. 2.  Consulted  Dr. Jacqualyn Posey on today who evaluated the patient as well.  He did not seem to think it was a frank osteomyelitis. 3.  We will place him on doxycycline 100 mg twice a day for 10 days and have him back in the office in 1 week. 4.  Patient advised should digit becomes swollen or painful, he is to report to the office or the emergency department immediately.  He related understanding.

## 2018-04-15 NOTE — Progress Notes (Signed)
  Subjective:     Patient ID: Galdino Hinchman, male   DOB: 03-21-1951, 68 y.o.   MRN: [id number]  HPI Mr. Bischoff is seen for f/u  low-grade infection 2nd digit left foot. He states he has completed all of his antibiotics and thinks his toe looks even better and the swelling has gone down.  Review of Systems Constitutional: negative for fever, chills, night sweats, nausea or vomiting. Skin: negative for foot wounds    Objective:   Physical Exam Neurovascular status remains intact b/l  Left second digit edema is resolved. Bulbous appearance resolved. No tenderness to palpation. No edema, no erythema, no postinflammatory hyperpigmentation.    Assessment:     Resolved cellulitis/abscess left 2nd digit.    Plan:     1. Take antibiotics until all gone. 2. Advised him to apply Vitamin E or cocoa butter to toes for moisture. 3. Follow up 9-12 weeks. He will be traveling out of town so he is to follow up with me when he is back in town. He will call me if he has any questions/concerns in the interim.

## 2018-05-31 ENCOUNTER — Ambulatory Visit: Payer: Medicare HMO | Admitting: Podiatry

## 2018-06-14 ENCOUNTER — Ambulatory Visit: Payer: Medicare HMO | Admitting: Family Medicine

## 2018-06-26 ENCOUNTER — Ambulatory Visit: Payer: Medicare HMO | Admitting: Podiatry

## 2018-07-27 MED FILL — ?ROSUVASTATIN CALCIUM 40MG: 40 | 30 days supply | Qty: 30 | Fill #0

## 2018-07-27 MED FILL — ?METFORMIN HCL 1000 MG TAB: 1000 | 30 days supply | Qty: 60 | Fill #1

## 2018-07-27 MED FILL — !VIAGRA 100MG TABLET: 100 | 30 days supply | Qty: 10 | Fill #1

## 2018-07-27 MED FILL — LISINOPRIL 20 MG TAB: 20 | 30 days supply | Qty: 30 | Fill #0

## 2018-07-27 MED FILL — ?METOPROLOL 100 MG TABLET: 100 | 30 days supply | Qty: 60 | Fill #1

## 2018-07-27 MED FILL — ?HUMULIN 70/30 VIAL: (70-30) 100 | 31 days supply | Qty: 20 | Fill #1

## 2018-07-30 ENCOUNTER — Other Ambulatory Visit: Payer: Self-pay

## 2018-07-30 ENCOUNTER — Encounter: Payer: Self-pay | Admitting: Family Medicine

## 2018-07-30 ENCOUNTER — Ambulatory Visit: Payer: Medicare HMO | Attending: Family Medicine | Admitting: Family Medicine

## 2018-07-30 DIAGNOSIS — I10 Essential (primary) hypertension: Secondary | ICD-10-CM | POA: Diagnosis not present

## 2018-07-30 DIAGNOSIS — E1169 Type 2 diabetes mellitus with other specified complication: Secondary | ICD-10-CM

## 2018-07-30 DIAGNOSIS — Z794 Long term (current) use of insulin: Secondary | ICD-10-CM

## 2018-07-30 DIAGNOSIS — E7800 Pure hypercholesterolemia, unspecified: Secondary | ICD-10-CM

## 2018-07-30 MED ORDER — GABAPENTIN 300 MG PO CAPS: 300.0000 mg | ORAL_CAPSULE | Freq: Three times a day (TID) | ORAL | 1 refills | Status: DC

## 2018-07-30 MED ORDER — METFORMIN HCL 1000 MG PO TABS: 1000.0000 mg | ORAL_TABLET | Freq: Two times a day (BID) | ORAL | 1 refills | Status: DC

## 2018-07-30 MED ORDER — HUMULIN 70/30 (70-30) 100 UNIT/ML ~~LOC~~ SUSP: SUBCUTANEOUS | 3 refills | Status: DC

## 2018-07-30 MED ORDER — LISINOPRIL-HYDROCHLOROTHIAZIDE 20-12.5 MG PO TABS: 2.0000 | ORAL_TABLET | Freq: Every day | ORAL | 1 refills | Status: DC

## 2018-07-30 MED ORDER — FREESTYLE LIBRE 14 DAY READER DEVI: 1.0000 | 0 refills | Status: DC

## 2018-07-30 MED ORDER — METOPROLOL TARTRATE 100 MG PO TABS: 100.0000 mg | ORAL_TABLET | Freq: Two times a day (BID) | ORAL | 1 refills | Status: DC

## 2018-07-30 MED ORDER — ROSUVASTATIN CALCIUM 40 MG PO TABS: 40.0000 mg | ORAL_TABLET | Freq: Every day | ORAL | 1 refills | Status: DC

## 2018-07-30 MED ORDER — FREESTYLE LIBRE 14 DAY SENSOR MISC: 1.0000 | 2 refills | Status: DC

## 2018-07-30 MED FILL — GABAPENTIN 300 MG CAPSULE: 300 | 30 days supply | Qty: 90 | Fill #0

## 2018-07-30 MED FILL — LISINOPRIL-HCTZ 20-12.5 MG: 20-12.5 | 30 days supply | Qty: 60 | Fill #0

## 2018-07-30 NOTE — Progress Notes (Signed)
Virtual Visit via Telephone Note  I connected with Caleb [name], on 07/30/2018 at 3:23 PM by telephone due to the COVID-19 pandemic and verified that I am speaking with the correct person using two identifiers.   Consent: I discussed the limitations, risks, security and privacy concerns of performing an evaluation and management service by telephone and the availability of in person appointments. I also discussed with the patient that there may be a patient responsible charge related to this service. The patient expressed understanding and agreed to proceed.   Location of Patient: Home  Location of Provider: Clinic   Persons participating in Telemedicine visit: Steven Basso- CMA Dr. Felecia Shelling     History of Present Illness: Caleb [name] is a 68 year old male with a history of type 2 diabetes mellitus (A1c 7.7), hypertension, hyperlipidemia who presents today for a follow-up visit. He denies chest pain, dyspnea but endorses pedal edema which he attributes to amlodipine and symptoms resolved ever since he discontinued amlodipine 1 month ago. His blood pressures have been running around 170/110 and he endorses compliance with his other antihypertensive.  His fasting blood sugar has been in the 100-110 range and he denies hypoglycemia.  Currently takes gabapentin for neuropathy. With regards to his hyperlipidemia he was placed on Crestor by his cardiologist Dr. Percival Spanish but on further questioning he has not been taking it but has been taking atorvastatin.  His last visit with cardiology was in 02/2018. He denies acute concerns today and is requesting refills of his medications. 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   Past Medical History:  Diagnosis Date  . Diabetes mellitus without complication ([place])   . Dyslipidemia 09/27/2012  . Erectile dysfunction 09/27/2012  . Hypercholesteremia   .  Hyperlipidemia 08/12/2012  . Hypertension   . Hyponatremia 08/14/2012  . Pancreatitis   . Pancreatitis, acute 09/27/2012  . Vitamin D deficiency 06/23/2015   No Known Allergies  Current Outpatient Medications on File Prior to Visit  Medication Sig Dispense Refill  . Blood Glucose Monitoring Suppl (ONETOUCH VERIO) w/Device KIT 1 each by Does not apply route 2 (two) times daily. Use as instructed twice daily, check fasting sugar in morning and check before bedtime. E11.8 1 kit 0  . gabapentin (NEURONTIN) 300 MG capsule Take 1 capsule (300 mg total) by mouth 3 (three) times daily. 180 capsule 3  . glucose blood test strip Use as instructed twice daily, check fasting sugar in morning and check before bedtime. E11.8 100 each 12  . HUMULIN 70/30 (70-30) 100 UNIT/ML injection INJECT 32 UNITS INTO THE SKIN 2 TIMES DAILY WITH A MEAL. (Patient taking differently: Inject 32 Units into the skin daily. INJECT 32 UNITS INTO THE SKIN 2 TIMES DAILY WITH A MEAL.) 60 mL 3  . lisinopril (PRINIVIL,ZESTRIL) 20 MG tablet Take 1 tablet (20 mg total) by mouth daily. 90 tablet 3  . metFORMIN (GLUCOPHAGE) 1000 MG tablet Take 1 tablet (1,000 mg total) by mouth 2 (two) times daily with a meal. 120 tablet 4  . metoprolol tartrate (LOPRESSOR) 100 MG tablet Take 1 tablet (100 mg total) by mouth 2 (two) times daily. 60 tablet 4  . ONETOUCH DELICA LANCETS 50K MISC Use as instructed twice daily, check fasting sugar in morning and check before bedtime. E11.8 100 each 12  . sildenafil (VIAGRA) 100 MG tablet TAKE 1 TABLET BY MOUTH DAILY AS NEEDED FOR ERECTILE DYSFUNCTION 10  tablet 3  . Vitamin D, Ergocalciferol, (DRISDOL) 50000 units CAPS capsule Take 1 capsule (50,000 Units total) by mouth every 7 (seven) days. 12 capsule 0  . amLODipine (NORVASC) 5 MG tablet Take 1 tablet (5 mg total) by mouth daily. (Patient not taking: Reported on 07/30/2018) 90 tablet 3  . rosuvastatin (CRESTOR) 40 MG tablet Take 1 tablet (40 mg total) by mouth  daily. To lower cholesterol (Patient not taking: Reported on 07/30/2018) 90 tablet 3   No current facility-administered medications on file prior to visit.     Observations/Objective: Awake, alert, oriented x3 Not in acute distress  CMP Latest Ref Rng & Units 02/08/2018 09/28/2017 12/13/2016  Glucose 65 - 99 mg/dL 174(H) 232(H) 181(H)  BUN 8 - 27 mg/dL 14 28(H) 19  Creatinine 0.76 - 1.27 mg/dL 0.90 1.21 1.06  Sodium 134 - 144 mmol/L 138 135 136  Potassium 3.5 - 5.2 mmol/L 4.7 4.9 4.2  Chloride 96 - 106 mmol/L 98 97 98  CO2 20 - 29 mmol/L '24 22 24  ' Calcium 8.6 - 10.2 mg/dL 9.2 9.4 9.5  Total Protein 6.0 - 8.5 g/dL 7.0 7.1 -  Total Bilirubin 0.0 - 1.2 mg/dL 0.3 0.3 -  Alkaline Phos 39 - 117 IU/L 161(H) 183(H) -  AST 0 - 40 IU/L 23 21 -  ALT 0 - 44 IU/L 24 38 -    Lipid Panel     Component Value Date/Time   CHOL 206 (H) 09/28/2017 1113   TRIG 346 (H) 09/28/2017 1113   HDL 43 09/28/2017 1113   CHOLHDL 4.8 09/28/2017 1113   CHOLHDL 3.9 04/02/2013 1632   VLDL 66 (H) 04/02/2013 1632   LDLCALC 94 09/28/2017 1113    Lab Results  Component Value Date   HGBA1C 7.7 (A) 02/08/2018     Assessment and Plan: 1. Pure hypercholesterolemia Uncontrolled Low-cholesterol diet - rosuvastatin (CRESTOR) 40 MG tablet; Take 1 tablet (40 mg total) by mouth daily. To lower cholesterol  Dispense: 90 tablet; Refill: 1  2. Essential hypertension Uncontrolled Discontinue amlodipine due to pedal edema Commence lisinopril/HCTZ Counseled on blood pressure goal of less than 130/80, low-sodium, DASH diet, medication compliance, 150 minutes of moderate intensity exercise per week. Discussed medication compliance, adverse effects. - metoprolol tartrate (LOPRESSOR) 100 MG tablet; Take 1 tablet (100 mg total) by mouth 2 (two) times daily.  Dispense: 180 tablet; Refill: 1 - lisinopril-hydrochlorothiazide (ZESTORETIC) 20-12.5 MG tablet; Take 2 tablets by mouth daily.  Dispense: 180 tablet; Refill: 1  3.  Type 2 diabetes mellitus with other specified complication, with long-term current use of insulin (HCC) Not at goal with A1c of 7.7 which is slightly elevated; goal is less than 7 Blood sugar log reveals stable sugars No regimen change today but will send of A1c Counseled on blood pressure goal of less than 130/80, low-sodium, DASH diet, medication compliance, 150 minutes of moderate intensity exercise per week. Discussed medication compliance, adverse effects. - metFORMIN (GLUCOPHAGE) 1000 MG tablet; Take 1 tablet (1,000 mg total) by mouth 2 (two) times daily with a meal.  Dispense: 360 tablet; Refill: 1 - HUMULIN 70/30 (70-30) 100 UNIT/ML injection; INJECT 32 UNITS INTO THE SKIN 2 TIMES DAILY WITH A MEAL.  Dispense: 60 mL; Refill: 3 - gabapentin (NEURONTIN) 300 MG capsule; Take 1 capsule (300 mg total) by mouth 3 (three) times daily.  Dispense: 180 capsule; Refill: 1 - CMP14+EGFR; Future - Lipid panel; Future - Microalbumin/Creatinine Ratio, Urine; Future - Hemoglobin A1c; Future   Follow Up Instructions:  Return in about 3 months (around 10/30/2018).    I discussed the assessment and treatment plan with the patient. The patient was provided an opportunity to ask questions and all were answered. The patient agreed with the plan and demonstrated an understanding of the instructions.   The patient was advised to call back or seek an in-person evaluation if the symptoms worsen or if the condition fails to improve as anticipated.     I provided 26 minutes total of non-face-to-face time during this encounter including median intraservice time, reviewing previous notes, labs, imaging, medications and explaining diagnosis and management.     [name], MD, FAAFP. [facility] [place], [place]   07/30/2018, 3:23 PM

## 2018-07-30 NOTE — Progress Notes (Signed)
Patient has been called and DOB has been verified. Patient has been screened and transferred to PCP to start phone visit.  Patient states that he has stop taking his Amlodipine due to swelling in feet.

## 2018-08-06 ENCOUNTER — Encounter: Payer: Self-pay | Admitting: Podiatry

## 2018-08-06 ENCOUNTER — Other Ambulatory Visit: Payer: Self-pay

## 2018-08-06 ENCOUNTER — Ambulatory Visit (INDEPENDENT_AMBULATORY_CARE_PROVIDER_SITE_OTHER): Payer: Medicare HMO | Admitting: Podiatry

## 2018-08-06 VITALS — Temp 96.8°F

## 2018-08-06 DIAGNOSIS — M79674 Pain in right toe(s): Secondary | ICD-10-CM

## 2018-08-06 DIAGNOSIS — Z794 Long term (current) use of insulin: Secondary | ICD-10-CM

## 2018-08-06 DIAGNOSIS — M79675 Pain in left toe(s): Secondary | ICD-10-CM

## 2018-08-06 DIAGNOSIS — E119 Type 2 diabetes mellitus without complications: Secondary | ICD-10-CM | POA: Diagnosis not present

## 2018-08-06 DIAGNOSIS — B351 Tinea unguium: Secondary | ICD-10-CM

## 2018-08-06 NOTE — Patient Instructions (Signed)
Diabetes Mellitus and Foot Care Foot care is an important part of your health, especially when you have diabetes. Diabetes may cause you to have problems because of poor blood flow (circulation) to your feet and legs, which can cause your skin to:  Become thinner and drier.  Break more easily.  Heal more slowly.  Peel and crack. You may also have nerve damage (neuropathy) in your legs and feet, causing decreased feeling in them. This means that you may not notice minor injuries to your feet that could lead to more serious problems. Noticing and addressing any potential problems early is the best way to prevent future foot problems. How to care for your feet Foot hygiene  Wash your feet daily with warm water and mild soap. Do not use hot water. Then, pat your feet and the areas between your toes until they are completely dry. Do not soak your feet as this can dry your skin.  Trim your toenails straight across. Do not dig under them or around the cuticle. File the edges of your nails with an emery board or nail file.  Apply a moisturizing lotion or petroleum jelly to the skin on your feet and to dry, brittle toenails. Use lotion that does not contain alcohol and is unscented. Do not apply lotion between your toes. Shoes and socks  Wear clean socks or stockings every day. Make sure they are not too tight. Do not wear knee-high stockings since they may decrease blood flow to your legs.  Wear shoes that fit properly and have enough cushioning. Always look in your shoes before you put them on to be sure there are no objects inside.  To break in new shoes, wear them for just a few hours a day. This prevents injuries on your feet. Wounds, scrapes, corns, and calluses  Check your feet daily for blisters, cuts, bruises, sores, and redness. If you cannot see the bottom of your feet, use a mirror or ask someone for help.  Do not cut corns or calluses or try to remove them with medicine.  If you  find a minor scrape, cut, or break in the skin on your feet, keep it and the skin around it clean and dry. You may clean these areas with mild soap and water. Do not clean the area with peroxide, alcohol, or iodine.  If you have a wound, scrape, corn, or callus on your foot, look at it several times a day to make sure it is healing and not infected. Check for: ? Redness, swelling, or pain. ? Fluid or blood. ? Warmth. ? Pus or a bad smell. General instructions  Do not cross your legs. This may decrease blood flow to your feet.  Do not use heating pads or hot water bottles on your feet. They may burn your skin. If you have lost feeling in your feet or legs, you may not know this is happening until it is too late.  Protect your feet from hot and cold by wearing shoes, such as at the beach or on hot pavement.  Schedule a complete foot exam at least once a year (annually) or more often if you have foot problems. If you have foot problems, report any cuts, sores, or bruises to your health care provider immediately. Contact a health care provider if:  You have a medical condition that increases your risk of infection and you have any cuts, sores, or bruises on your feet.  You have an injury that is not   healing.  You have redness on your legs or feet.  You feel burning or tingling in your legs or feet.  You have pain or cramps in your legs and feet.  Your legs or feet are numb.  Your feet always feel cold.  You have pain around a toenail. Get help right away if:  You have a wound, scrape, corn, or callus on your foot and: ? You have pain, swelling, or redness that gets worse. ? You have fluid or blood coming from the wound, scrape, corn, or callus. ? Your wound, scrape, corn, or callus feels warm to the touch. ? You have pus or a bad smell coming from the wound, scrape, corn, or callus. ? You have a fever. ? You have a red line going up your leg. Summary  Check your feet every day  for cuts, sores, red spots, swelling, and blisters.  Moisturize feet and legs daily.  Wear shoes that fit properly and have enough cushioning.  If you have foot problems, report any cuts, sores, or bruises to your health care provider immediately.  Schedule a complete foot exam at least once a year (annually) or more often if you have foot problems. This information is not intended to replace advice given to you by your health care provider. Make sure you discuss any questions you have with your health care provider. Document Released: 03/04/2000 Document Revised: 04/19/2017 Document Reviewed: 04/08/2016 Elsevier Interactive Patient Education  2019 [employer]  Onychomycosis/Fungal Toenails  WHAT IS IT? An infection that lies within the keratin of your nail plate that is caused by a fungus.  WHY ME? Fungal infections affect all ages, sexes, races, and creeds.  There may be many factors that predispose you to a fungal infection such as age, coexisting medical conditions such as diabetes, or an autoimmune disease; stress, medications, fatigue, genetics, etc.  Bottom line: fungus thrives in a warm, moist environment and your shoes offer such a location.  IS IT CONTAGIOUS? Theoretically, yes.  You do not want to share shoes, nail clippers or files with someone who has fungal toenails.  Walking around barefoot in the same room or sleeping in the same bed is unlikely to transfer the organism.  It is important to realize, however, that fungus can spread easily from one nail to the next on the same foot.  HOW DO WE TREAT THIS?  There are several ways to treat this condition.  Treatment may depend on many factors such as age, medications, pregnancy, liver and kidney conditions, etc.  It is best to ask your doctor which options are available to you.  1. No treatment.   Unlike many other medical concerns, you can live with this condition.  However for many people this can be a painful condition and  may lead to ingrown toenails or a bacterial infection.  It is recommended that you keep the nails cut short to help reduce the amount of fungal nail. 2. Topical treatment.  These range from herbal remedies to prescription strength nail lacquers.  About 40-50% effective, topicals require twice daily application for approximately 9 to 12 months or until an entirely new nail has grown out.  The most effective topicals are medical grade medications available through physicians offices. 3. Oral antifungal medications.  With an 80-90% cure rate, the most common oral medication requires 3 to 4 months of therapy and stays in your system for a year as the new nail grows out.  Oral antifungal medications do require   blood work to make sure it is a safe drug for you.  A liver function panel will be performed prior to starting the medication and after the first month of treatment.  It is important to have the blood work performed to avoid any harmful side effects.  In general, this medication safe but blood work is required. 4. Laser Therapy.  This treatment is performed by applying a specialized laser to the affected nail plate.  This therapy is noninvasive, fast, and non-painful.  It is not covered by insurance and is therefore, out of pocket.  The results have been very good with a 80-95% cure rate.  The [facility] is the only practice in the area to offer this therapy. 5. Permanent Nail Avulsion.  Removing the entire nail so that a new nail will not grow back. 

## 2018-08-13 NOTE — Progress Notes (Signed)
Subjective: Caleb [name] presents for preventative diabetic foot care today with painful, thick toenails 1-5 b/l that he cannot cut and which interfere with daily activities.  Pain is aggravated when wearing enclosed shoe gear.  [name], MD is his PCP.    Current Outpatient Medications:  .  Blood Glucose Monitoring Suppl (ONETOUCH VERIO) w/Device KIT, 1 each by Does not apply route 2 (two) times daily. Use as instructed twice daily, check fasting sugar in morning and check before bedtime. E11.8, Disp: 1 kit, Rfl: 0 .  Continuous Blood Gluc Receiver (FREESTYLE LIBRE 14 DAY READER) DEVI, 1 each by Does not apply route See admin instructions. Use as directed to check blood glucose, Disp: 1 Device, Rfl: 0 .  Continuous Blood Gluc Sensor (FREESTYLE LIBRE 14 DAY SENSOR) MISC, Inject 1 each into the skin every 14 (fourteen) days., Disp: 2 each, Rfl: 2 .  gabapentin (NEURONTIN) 300 MG capsule, Take 1 capsule (300 mg total) by mouth 3 (three) times daily., Disp: 180 capsule, Rfl: 1 .  glucose blood test strip, Use as instructed twice daily, check fasting sugar in morning and check before bedtime. E11.8, Disp: 100 each, Rfl: 12 .  HUMULIN 70/30 (70-30) 100 UNIT/ML injection, INJECT 32 UNITS INTO THE SKIN 2 TIMES DAILY WITH A MEAL., Disp: 60 mL, Rfl: 3 .  lisinopril-hydrochlorothiazide (ZESTORETIC) 20-12.5 MG tablet, Take 2 tablets by mouth daily., Disp: 180 tablet, Rfl: 1 .  metFORMIN (GLUCOPHAGE) 1000 MG tablet, Take 1 tablet (1,000 mg total) by mouth 2 (two) times daily with a meal., Disp: 360 tablet, Rfl: 1 .  metoprolol tartrate (LOPRESSOR) 100 MG tablet, Take 1 tablet (100 mg total) by mouth 2 (two) times daily., Disp: 180 tablet, Rfl: 1 .  ONETOUCH DELICA LANCETS 80H MISC, Use as instructed twice daily, check fasting sugar in morning and check before bedtime. E11.8, Disp: 100 each, Rfl: 12 .  rosuvastatin (CRESTOR) 40 MG tablet, Take 1 tablet (40 mg total) by mouth daily. To lower cholesterol,  Disp: 90 tablet, Rfl: 1 .  sildenafil (VIAGRA) 100 MG tablet, TAKE 1 TABLET BY MOUTH DAILY AS NEEDED FOR ERECTILE DYSFUNCTION, Disp: 10 tablet, Rfl: 3 .  Vitamin D, Ergocalciferol, (DRISDOL) 50000 units CAPS capsule, Take 1 capsule (50,000 Units total) by mouth every 7 (seven) days., Disp: 12 capsule, Rfl: 0  No Known Allergies  Objective:  Vascular Examination: Capillary refill time immediate x 10 digits.  Dorsalis pedis and Posterior tibial pulses palpable b/l.  Digital hair present x 10 digits.  Skin temperature gradient WNL b/l.  Dermatological Examination: Skin with normal turgor, texture and tone b/l.  Toenails 1-5 b/l discolored, thick, dystrophic with subungual debris and pain with palpation to nailbeds due to thickness of nails.  Left 2nd digit with no edema, no erythema, no drainage, no flocculence.  Musculoskeletal: Muscle strength 5/5 to all LE muscle groups.  Neurological: Sensation intact with 10 gram monofilament.  Vibratory sensation intact b/l.  Vibratory sensation intact.  Assessment: Painful onychomycosis toenails 1-5 b/l  NIDDM  Plan: 1. Toenails 1-5 b/l were debrided in length and girth without iatrogenic bleeding. 2. Patient to continue soft, supportive shoe gear daily. 3. Patient to report any pedal injuries to medical professional immediately. 4. Follow up 3 months.  5. Patient/POA to call should there be a concern in the interim.

## 2018-10-27 ENCOUNTER — Other Ambulatory Visit: Payer: Self-pay | Admitting: Family Medicine

## 2018-10-27 DIAGNOSIS — E1169 Type 2 diabetes mellitus with other specified complication: Secondary | ICD-10-CM

## 2018-10-27 DIAGNOSIS — Z794 Long term (current) use of insulin: Secondary | ICD-10-CM

## 2018-11-05 ENCOUNTER — Ambulatory Visit: Payer: Medicare HMO | Admitting: Podiatry

## 2018-11-15 MED FILL — ?METOPROLOL 100 MG TABLET: 100 | 30 days supply | Qty: 60 | Fill #0

## 2018-11-15 MED FILL — ?ROSUVASTATIN CALCIUM 40MG: 40 | 30 days supply | Qty: 30 | Fill #0

## 2018-11-15 MED FILL — ?HUMULIN 70/30 VIAL: (70-30) 100 | 31 days supply | Qty: 20 | Fill #0

## 2018-11-15 MED FILL — GABAPENTIN 300 MG CAPSULE: 300 | 30 days supply | Qty: 90 | Fill #1

## 2018-11-15 MED FILL — metFORMIN HCL 1000 MG TABS: 1000 | 30 days supply | Qty: 60 | Fill #0

## 2018-11-15 MED FILL — LISINOPRIL-HCTZ 20-12.5 MG: 20-12.5 | 30 days supply | Qty: 60 | Fill #1

## 2018-11-15 MED FILL — !VIAGRA 50 MG TABLET: 50 | 30 days supply | Qty: 20 | Fill #0

## 2018-12-21 ENCOUNTER — Other Ambulatory Visit: Payer: Self-pay | Admitting: Family Medicine

## 2018-12-21 DIAGNOSIS — E1169 Type 2 diabetes mellitus with other specified complication: Secondary | ICD-10-CM

## 2018-12-21 DIAGNOSIS — Z794 Long term (current) use of insulin: Secondary | ICD-10-CM

## 2018-12-26 ENCOUNTER — Telehealth: Payer: Self-pay | Admitting: Family Medicine

## 2018-12-26 NOTE — Telephone Encounter (Signed)
1) Medication(s) Requested (by name): Continuous Blood Gluc Sensor (FREESTYLE LIBRE [place]) [place] [id number]   2) Pharmacy of Choice:  [id number] - [place], [place]

## 2018-12-26 NOTE — Telephone Encounter (Signed)
Tried to run a test claim through our pharmacy and this came back as not covered.

## 2018-12-26 NOTE — Telephone Encounter (Signed)
Hey Caleb [name] can yoou take a look at this and see if patient's insurance will cover.

## 2018-12-27 ENCOUNTER — Other Ambulatory Visit: Payer: Self-pay

## 2018-12-27 DIAGNOSIS — E1169 Type 2 diabetes mellitus with other specified complication: Secondary | ICD-10-CM

## 2018-12-27 DIAGNOSIS — Z794 Long term (current) use of insulin: Secondary | ICD-10-CM

## 2018-12-27 MED ORDER — FREESTYLE LIBRE 14 DAY SENSOR MISC: 1.0000 | 2 refills | Status: DC

## 2018-12-27 NOTE — Telephone Encounter (Signed)
Requested medication has been sent to pharmacy.

## 2019-01-14 ENCOUNTER — Encounter: Payer: Self-pay | Admitting: Family Medicine

## 2019-01-14 ENCOUNTER — Ambulatory Visit: Payer: Medicare HMO | Attending: Family Medicine | Admitting: Family Medicine

## 2019-01-14 ENCOUNTER — Other Ambulatory Visit: Payer: Self-pay

## 2019-01-14 VITALS — BP 160/103 | HR 59 | Temp 98.2°F | Resp 16 | Wt 187.6 lb

## 2019-01-14 DIAGNOSIS — Z794 Long term (current) use of insulin: Secondary | ICD-10-CM | POA: Diagnosis not present

## 2019-01-14 DIAGNOSIS — R748 Abnormal levels of other serum enzymes: Secondary | ICD-10-CM

## 2019-01-14 DIAGNOSIS — I10 Essential (primary) hypertension: Secondary | ICD-10-CM

## 2019-01-14 DIAGNOSIS — Z23 Encounter for immunization: Secondary | ICD-10-CM | POA: Diagnosis not present

## 2019-01-14 DIAGNOSIS — E11649 Type 2 diabetes mellitus with hypoglycemia without coma: Secondary | ICD-10-CM | POA: Diagnosis not present

## 2019-01-14 DIAGNOSIS — E1169 Type 2 diabetes mellitus with other specified complication: Secondary | ICD-10-CM

## 2019-01-14 DIAGNOSIS — Z1211 Encounter for screening for malignant neoplasm of colon: Secondary | ICD-10-CM

## 2019-01-14 DIAGNOSIS — E7800 Pure hypercholesterolemia, unspecified: Secondary | ICD-10-CM

## 2019-01-14 LAB — GLUCOSE, POCT (MANUAL RESULT ENTRY): POC Glucose: 132 mg/dl — AB (ref 70–99)

## 2019-01-14 MED ORDER — ROSUVASTATIN CALCIUM 40 MG PO TABS: 40.0000 mg | ORAL_TABLET | Freq: Every day | ORAL | 1 refills | Status: DC

## 2019-01-14 MED ORDER — METFORMIN HCL 1000 MG PO TABS: 1000.0000 mg | ORAL_TABLET | Freq: Two times a day (BID) | ORAL | 1 refills | Status: DC

## 2019-01-14 MED ORDER — METOPROLOL TARTRATE 100 MG PO TABS: 100.0000 mg | ORAL_TABLET | Freq: Two times a day (BID) | ORAL | 1 refills | Status: DC

## 2019-01-14 MED ORDER — LISINOPRIL-HYDROCHLOROTHIAZIDE 20-12.5 MG PO TABS: 2.0000 | ORAL_TABLET | Freq: Every day | ORAL | 1 refills | Status: DC

## 2019-01-14 MED ORDER — GABAPENTIN 300 MG PO CAPS: 300.0000 mg | ORAL_CAPSULE | Freq: Two times a day (BID) | ORAL | 1 refills | Status: DC

## 2019-01-14 MED FILL — ?METOPROLOL 100 MG TABLET: 100 | 30 days supply | Qty: 60 | Fill #0

## 2019-01-14 MED FILL — LISINOPRIL-HCTZ 20-12.5 MG: 20-12.5 | 30 days supply | Qty: 60 | Fill #0

## 2019-01-14 MED FILL — ?ROSUVASTATIN CALCIUM 40MG: 40 | 30 days supply | Qty: 30 | Fill #0

## 2019-01-14 MED FILL — metFORMIN HCL 1000 MG TABS: 1000 | 30 days supply | Qty: 60 | Fill #0

## 2019-01-14 MED FILL — GABAPENTIN 300 MG CAPSULE: 300 | 30 days supply | Qty: 60 | Fill #0

## 2019-01-14 NOTE — Progress Notes (Signed)
Subjective:  Patient ID: Melven Stockard, male    DOB: [birth year]  Age: 68 y.o. MRN: [id number]  CC: Hypertension and Diabetes   HPI Obi Scrima is a 68 year old male with a history of type 2 diabetes mellitus (A1c 7.6), hypertension, hyperlipidemia who presents today for a follow-up visit. His blood pressure is elevated today and at home it runs between 171-180/95-103 and he has been taking 1 tablet of lisinopril 25/12.5 mg once daily rather than 2 tablets once daily but has been compliant with his other antihypertensive. Endorses compliance with his diabetic medications and his neuropathy is controlled on gabapentin.  He did have an episode of hypoglycemia with a blood sugar of 56 on just one occasion.  Denies visual concerns. For hyperlipidemia he remains on statin and denies myalgias. He has no additional concerns today.  Past Medical History:  Diagnosis Date  . Diabetes mellitus without complication ([place])   . Dyslipidemia 09/27/2012  . Erectile dysfunction 09/27/2012  . Hypercholesteremia   . Hyperlipidemia 08/12/2012  . Hypertension   . Hyponatremia 08/14/2012  . Pancreatitis   . Pancreatitis, acute 09/27/2012  . Vitamin D deficiency 06/23/2015    Past Surgical History:  Procedure Laterality Date  . KNEE SURGERY      Family History  Problem Relation Age of Onset  . CAD Father   . Hypertension Father   . Alcohol abuse Father        Cause of death  . Diabetes Mother   . CAD Brother 28       CABG    No Known Allergies  Outpatient Medications Prior to Visit  Medication Sig Dispense Refill  . Blood Glucose Monitoring Suppl (ONETOUCH VERIO) w/Device KIT 1 each by Does not apply route 2 (two) times daily. Use as instructed twice daily, check fasting sugar in morning and check before bedtime. E11.8 1 kit 0  . Continuous Blood Gluc Receiver (FREESTYLE LIBRE 14 DAY READER) DEVI 1 each by Does not apply route See admin instructions. Use as directed to check blood glucose 1 Device  0  . Continuous Blood Gluc Sensor (FREESTYLE LIBRE 14 DAY SENSOR) MISC Inject 1 each into the skin every 14 (fourteen) days. E11.69 2 each 2  . glucose blood test strip Use as instructed twice daily, check fasting sugar in morning and check before bedtime. E11.8 100 each 12  . HUMULIN 70/30 (70-30) 100 UNIT/ML injection INJECT 32 UNITS INTO THE SKIN 2 TIMES DAILY WITH A MEAL. 60 mL 3  . ONETOUCH DELICA LANCETS 52D MISC Use as instructed twice daily, check fasting sugar in morning and check before bedtime. E11.8 100 each 12  . sildenafil (VIAGRA) 100 MG tablet TAKE 1 TABLET BY MOUTH DAILY AS NEEDED FOR ERECTILE DYSFUNCTION 10 tablet 3  . Vitamin D, Ergocalciferol, (DRISDOL) 50000 units CAPS capsule Take 1 capsule (50,000 Units total) by mouth every 7 (seven) days. 12 capsule 0  . gabapentin (NEURONTIN) 300 MG capsule Take 1 capsule (300 mg total) by mouth 3 (three) times daily. 180 capsule 1  . lisinopril-hydrochlorothiazide (ZESTORETIC) 20-12.5 MG tablet Take 2 tablets by mouth daily. 180 tablet 1  . metFORMIN (GLUCOPHAGE) 1000 MG tablet Take 1 tablet (1,000 mg total) by mouth 2 (two) times daily with a meal. 360 tablet 1  . metoprolol tartrate (LOPRESSOR) 100 MG tablet Take 1 tablet (100 mg total) by mouth 2 (two) times daily. 180 tablet 1  . rosuvastatin (CRESTOR) 40 MG tablet Take 1 tablet (40 mg total) by mouth  daily. To lower cholesterol 90 tablet 1   No facility-administered medications prior to visit.      ROS Review of Systems  Constitutional: Negative for activity change and appetite change.  HENT: Negative for sinus pressure and sore throat.   Eyes: Negative for visual disturbance.  Respiratory: Negative for cough, chest tightness and shortness of breath.   Cardiovascular: Negative for chest pain and leg swelling.  Gastrointestinal: Negative for abdominal distention, abdominal pain, constipation and diarrhea.  Endocrine: Negative.   Genitourinary: Negative for dysuria.   Musculoskeletal: Negative for joint swelling and myalgias.  Skin: Negative for rash.  Allergic/Immunologic: Negative.   Neurological: Negative for weakness, light-headedness and numbness.  Psychiatric/Behavioral: Negative for dysphoric mood and suicidal ideas.    Objective:  BP (!) 160/103   Pulse (!) 59   Temp 98.2 F (36.8 C) (Oral)   Resp 16   Wt 187 lb 9.6 oz (85.1 kg)   SpO2 98%   BMI 28.95 kg/m   BP/Weight 01/14/2019 03/07/2018 51/04/5850  Systolic BP 778 242 353  Diastolic BP 614 81 76  Wt. (Lbs) 187.6 - -  BMI 28.95 - -      Physical Exam Constitutional:      Appearance: He is well-developed.  Neck:     Vascular: No JVD.  Cardiovascular:     Rate and Rhythm: Normal rate.     Heart sounds: Normal heart sounds. No murmur.  Pulmonary:     Effort: Pulmonary effort is normal.     Breath sounds: Normal breath sounds. No wheezing or rales.  Chest:     Chest wall: No tenderness.  Abdominal:     General: Bowel sounds are normal. There is no distension.     Palpations: Abdomen is soft. There is no mass.     Tenderness: There is no abdominal tenderness.  Musculoskeletal: Normal range of motion.     Right lower leg: No edema.     Left lower leg: No edema.  Neurological:     Mental Status: He is alert and oriented to person, place, and time.  Psychiatric:        Mood and Affect: Mood normal.     CMP Latest Ref Rng & Units 02/08/2018 09/28/2017 12/13/2016  Glucose 65 - 99 mg/dL 174(H) 232(H) 181(H)  BUN 8 - 27 mg/dL 14 28(H) 19  Creatinine 0.76 - 1.27 mg/dL 0.90 1.21 1.06  Sodium 134 - 144 mmol/L 138 135 136  Potassium 3.5 - 5.2 mmol/L 4.7 4.9 4.2  Chloride 96 - 106 mmol/L 98 97 98  CO2 20 - 29 mmol/L _0 Calcium 8.6 - 10.2 mg/dL 9.2 9.4 9.5  Total Protein 6.0 - 8.5 g/dL 7.0 7.1 -  Total Bilirubin 0.0 - 1.2 mg/dL 0.3 0.3 -  Alkaline Phos 39 - 117 IU/L 161(H) 183(H) -  AST 0 - 40 IU/L 23 21 -  ALT 0 - 44 IU/L 24 38 -    Lipid Panel     Component  Value Date/Time   CHOL 206 (H) 09/28/2017 1113   TRIG 346 (H) 09/28/2017 1113   HDL 43 09/28/2017 1113   CHOLHDL 4.8 09/28/2017 1113   CHOLHDL 3.9 04/02/2013 1632   VLDL 66 (H) 04/02/2013 1632   LDLCALC 94 09/28/2017 1113    CBC    Component Value Date/Time   WBC 6.3 09/28/2017 1113   WBC 5.2 06/06/2016 1455   RBC 5.29 09/28/2017 1113   RBC 5.53 06/06/2016 1455   HGB  15.1 09/28/2017 1113   HCT 43.2 09/28/2017 1113   PLT 243 09/28/2017 1113   MCV 82 09/28/2017 1113   MCH 28.5 09/28/2017 1113   MCH 28.9 06/06/2016 1455   MCHC 35.0 09/28/2017 1113   MCHC 34.2 06/06/2016 1455   RDW 15.0 09/28/2017 1113   LYMPHSABS 3.3 (H) 09/28/2017 1113   MONOABS 0.5 06/09/2015 1705   EOSABS 0.3 09/28/2017 1113   BASOSABS 0.0 09/28/2017 1113    Lab Results  Component Value Date   HGBA1C 7.7 (A) 02/08/2018    Assessment & Plan:   1. Type 2 diabetes mellitus with hypoglycemia without coma, with long-term current use of insulin (HCC) Stable with A1c at 7.7 His A1c goal is less than 7.5 to prevent hypoglycemia given he has already experienced some episodes Discussed management of hypoglycemia Counseled on Diabetic diet, my plate method, 557 minutes of moderate intensity exercise/week Blood sugar logs with fasting goals of 80-120 mg/dl, random of less than 180 and in the event of sugars less than 60 mg/dl or greater than 400 mg/dl encouraged to notify the clinic. Advised on the need for annual eye exams, annual foot exams, Pneumonia vaccine. - POCT glucose (manual entry) - POCT glycosylated hemoglobin (Hb A1C) - CMP14+EGFR; Future - Lipid panel; Future - Microalbumin / creatinine urine ratio; Future - Pneumococcal polysaccharide vaccine 23-valent greater than or equal to 2yo subcutaneous/IM  2. Pure hypercholesterolemia Uncontrolled We will order lipid panel when he is fasting and adjust regimen accordingly Low-cholesterol diet - rosuvastatin (CRESTOR) 40 MG tablet; Take 1 tablet (40  mg total) by mouth daily. To lower cholesterol  Dispense: 90 tablet; Refill: 1  3. Essential hypertension Uncontrolled as he has been administering one rather than 2 tablets of lisinopril/HCTZ He will make adjustments Counseled on blood pressure goal of less than 130/80, low-sodium, DASH diet, medication compliance, 150 minutes of moderate intensity exercise per week. Discussed medication compliance, adverse effects. - metoprolol tartrate (LOPRESSOR) 100 MG tablet; Take 1 tablet (100 mg total) by mouth 2 (two) times daily.  Dispense: 180 tablet; Refill: 1 - lisinopril-hydrochlorothiazide (ZESTORETIC) 20-12.5 MG tablet; Take 2 tablets by mouth daily.  Dispense: 180 tablet; Refill: 1  4. Screening for colon cancer - Ambulatory referral to Gastroenterology  5. Type 2 diabetes mellitus with other specified complication, with long-term current use of insulin (HCC) See #7 above Continue gabapentin for neuropathy - gabapentin (NEURONTIN) 300 MG capsule; Take 1 capsule (300 mg total) by mouth 2 (two) times daily.  Dispense: 180 capsule; Refill: 1 - metFORMIN (GLUCOPHAGE) 1000 MG tablet; Take 1 tablet (1,000 mg total) by mouth 2 (two) times daily with a meal.  Dispense: 360 tablet; Refill: 1    Meds ordered this encounter  Medications  . rosuvastatin (CRESTOR) 40 MG tablet    Sig: Take 1 tablet (40 mg total) by mouth daily. To lower cholesterol    Dispense:  90 tablet    Refill:  1  . metoprolol tartrate (LOPRESSOR) 100 MG tablet    Sig: Take 1 tablet (100 mg total) by mouth 2 (two) times daily.    Dispense:  180 tablet    Refill:  1  . gabapentin (NEURONTIN) 300 MG capsule    Sig: Take 1 capsule (300 mg total) by mouth 2 (two) times daily.    Dispense:  180 capsule    Refill:  1  . lisinopril-hydrochlorothiazide (ZESTORETIC) 20-12.5 MG tablet    Sig: Take 2 tablets by mouth daily.    Dispense:  180 tablet    Refill:  1    Discontinue Amlodipine, Lisinorpil  . metFORMIN (GLUCOPHAGE)  1000 MG tablet    Sig: Take 1 tablet (1,000 mg total) by mouth 2 (two) times daily with a meal.    Dispense:  360 tablet    Refill:  1    Follow-up: Return in about 3 months (around 04/16/2019) for Chronic medical conditions.       [name], MD, FAAFP. [facility] [place] [place], [place]   01/14/2019, 3:58 PM

## 2019-01-15 ENCOUNTER — Ambulatory Visit: Payer: Medicare HMO | Attending: Family Medicine

## 2019-01-15 ENCOUNTER — Encounter: Payer: Self-pay | Admitting: Family Medicine

## 2019-01-15 DIAGNOSIS — Z794 Long term (current) use of insulin: Secondary | ICD-10-CM

## 2019-01-15 DIAGNOSIS — R748 Abnormal levels of other serum enzymes: Secondary | ICD-10-CM | POA: Diagnosis not present

## 2019-01-15 DIAGNOSIS — E11649 Type 2 diabetes mellitus with hypoglycemia without coma: Secondary | ICD-10-CM

## 2019-01-16 LAB — CMP14+EGFR
ALT: 46 IU/L — ABNORMAL HIGH (ref 0–44)
AST: 28 IU/L (ref 0–40)
Albumin/Globulin Ratio: 1.7 (ref 1.2–2.2)
Albumin: 4.6 g/dL (ref 3.8–4.8)
Alkaline Phosphatase: 176 IU/L — ABNORMAL HIGH (ref 39–117)
BUN/Creatinine Ratio: 10 (ref 10–24)
BUN: 11 mg/dL (ref 8–27)
Bilirubin Total: 0.5 mg/dL (ref 0.0–1.2)
CO2: 26 mmol/L (ref 20–29)
Calcium: 9.8 mg/dL (ref 8.6–10.2)
Chloride: 101 mmol/L (ref 96–106)
Creatinine, Ser: 1.08 mg/dL (ref 0.76–1.27)
GFR calc Af Amer: 81 mL/min/{1.73_m2} (ref >59)
GFR calc non Af Amer: 70 mL/min/{1.73_m2} (ref >59)
Globulin, Total: 2.7 g/dL (ref 1.5–4.5)
Glucose: 136 mg/dL — ABNORMAL HIGH (ref 65–99)
Potassium: 5.1 mmol/L (ref 3.5–5.2)
Sodium: 140 mmol/L (ref 134–144)
Total Protein: 7.3 g/dL (ref 6.0–8.5)

## 2019-01-16 LAB — MICROALBUMIN / CREATININE URINE RATIO
Creatinine, Urine: 150 mg/dL
Microalb/Creat Ratio: 366 mg/g creat — ABNORMAL HIGH (ref 0–29)
Microalbumin, Urine: 549.1 ug/mL

## 2019-01-16 LAB — POCT GLYCOSYLATED HEMOGLOBIN (HGB A1C): HbA1c, POC (controlled diabetic range): 7.6 % — AB (ref 0.0–7.0)

## 2019-01-16 LAB — LIPID PANEL
Chol/HDL Ratio: 2.7 ratio (ref 0.0–5.0)
Cholesterol, Total: 129 mg/dL (ref 100–199)
HDL: 47 mg/dL (ref >39)
LDL Chol Calc (NIH): 61 mg/dL (ref 0–99)
Triglycerides: 115 mg/dL (ref 0–149)
VLDL Cholesterol Cal: 21 mg/dL (ref 5–40)

## 2019-01-17 MED FILL — SILDENAFIL CITRATE 50 MG TA: 50 | 30 days supply | Qty: 20 | Fill #1

## 2019-01-18 NOTE — Addendum Note (Signed)
Addended by: Steffanie Dunn on: 01/18/2019 09:46 AM   Modules accepted: Orders

## 2019-01-21 ENCOUNTER — Encounter: Payer: Self-pay | Admitting: Gastroenterology

## 2019-01-23 ENCOUNTER — Telehealth: Payer: Self-pay

## 2019-01-23 LAB — PSA, TOTAL AND FREE
PSA, Free Pct: 30 %
PSA, Free: 0.09 ng/mL
Prostate Specific Ag, Serum: 0.3 ng/mL (ref 0.0–4.0)

## 2019-01-23 LAB — SPECIMEN STATUS REPORT

## 2019-01-23 NOTE — Telephone Encounter (Signed)
-----   Message from [name], MD sent at 01/23/2019 12:54 PM EST ----- Prostate test is normal.

## 2019-01-23 NOTE — Telephone Encounter (Signed)
Patient was called and no message can be left at this time due to voicemail being full.

## 2019-01-24 NOTE — Telephone Encounter (Signed)
Patient name and DOB has been verified Patient was informed of lab results. Patient had no questions.  

## 2019-01-25 DIAGNOSIS — E119 Type 2 diabetes mellitus without complications: Secondary | ICD-10-CM | POA: Insufficient documentation

## 2019-02-07 ENCOUNTER — Encounter: Payer: Self-pay | Admitting: Gastroenterology

## 2019-02-07 ENCOUNTER — Other Ambulatory Visit: Payer: Self-pay

## 2019-02-07 ENCOUNTER — Ambulatory Visit (AMBULATORY_SURGERY_CENTER): Payer: Medicare HMO | Admitting: *Deleted

## 2019-02-07 VITALS — Temp 97.1°F | Ht 67.5 in | Wt 191.0 lb

## 2019-02-07 DIAGNOSIS — Z1159 Encounter for screening for other viral diseases: Secondary | ICD-10-CM

## 2019-02-07 DIAGNOSIS — Z1211 Encounter for screening for malignant neoplasm of colon: Secondary | ICD-10-CM

## 2019-02-07 MED ORDER — NA SULFATE-K SULFATE-MG SULF 17.5-3.13-1.6 GM/177ML PO SOLN: ORAL | 0 refills | Status: DC

## 2019-02-07 NOTE — Progress Notes (Signed)
Patient is here in-person for PV. Patient denies any allergies to eggs or soy. Patient denies any problems with anesthesia/sedation. Patient denies any oxygen use at home. Patient denies taking any diet/weight loss medications or blood thinners. Patient is not being treated for MRSA or C-diff. EMMI education assisgned to the patient for the procedure, this was explained and instructions given to patient. COVID-19 screening test is on 02/18/2019, the pt is aware. Pt is aware that care partner will wait in the car during procedure; if they feel like they will be too hot or cold to wait in the car; they may wait in the 4 th floor lobby. Patient is aware to bring only one care partner. We want them to wear a mask (we do not have any that we can provide them), practice social distancing, and we will check their temperatures when they get here.  I did remind the patient that their care partner needs to stay in the parking lot the entire time and have a cell phone available, we will call them when the pt is ready for discharge. Patient will wear mask into building.

## 2019-02-18 ENCOUNTER — Telehealth: Payer: Self-pay | Admitting: Gastroenterology

## 2019-02-18 ENCOUNTER — Other Ambulatory Visit: Payer: Self-pay | Admitting: Gastroenterology

## 2019-02-18 ENCOUNTER — Ambulatory Visit (INDEPENDENT_AMBULATORY_CARE_PROVIDER_SITE_OTHER): Payer: Medicare HMO

## 2019-02-18 DIAGNOSIS — Z1159 Encounter for screening for other viral diseases: Secondary | ICD-10-CM

## 2019-02-18 DIAGNOSIS — Z1211 Encounter for screening for malignant neoplasm of colon: Secondary | ICD-10-CM

## 2019-02-18 MED ORDER — NA SULFATE-K SULFATE-MG SULF 17.5-3.13-1.6 GM/177ML PO SOLN: ORAL | 0 refills | Status: DC

## 2019-02-18 MED FILL — SUPREP BOWEL PREP KIT: 17.5-3.13-1 | 1 days supply | Qty: 354 | Fill #0

## 2019-02-18 NOTE — Telephone Encounter (Signed)
Patient went to pick up prep for procedure and he states they do not have it. Asking if it can be called in again. Pharmacy- [facility]. He did not know if it got called in to a different pharmacy.

## 2019-02-18 NOTE — Telephone Encounter (Signed)
Patient's pharmacy called. They do not have the Suprep rx. This was resent to them at this time. Patient was notified. He states he will go today before 5pm to see if they have the [place].

## 2019-02-19 LAB — SARS CORONAVIRUS 2 (TAT 6-24 HRS): SARS Coronavirus 2: NEGATIVE

## 2019-02-20 ENCOUNTER — Ambulatory Visit (AMBULATORY_SURGERY_CENTER): Payer: Medicare HMO | Admitting: Gastroenterology

## 2019-02-20 ENCOUNTER — Other Ambulatory Visit: Payer: Self-pay | Admitting: Gastroenterology

## 2019-02-20 ENCOUNTER — Other Ambulatory Visit: Payer: Self-pay

## 2019-02-20 ENCOUNTER — Encounter: Payer: Self-pay | Admitting: Gastroenterology

## 2019-02-20 VITALS — BP 113/68 | HR 76 | Temp 98.0°F | Resp 13 | Ht 67.5 in | Wt 191.0 lb

## 2019-02-20 DIAGNOSIS — D123 Benign neoplasm of transverse colon: Secondary | ICD-10-CM | POA: Diagnosis not present

## 2019-02-20 DIAGNOSIS — Z1211 Encounter for screening for malignant neoplasm of colon: Secondary | ICD-10-CM | POA: Diagnosis not present

## 2019-02-20 DIAGNOSIS — E119 Type 2 diabetes mellitus without complications: Secondary | ICD-10-CM | POA: Diagnosis not present

## 2019-02-20 DIAGNOSIS — I10 Essential (primary) hypertension: Secondary | ICD-10-CM | POA: Diagnosis not present

## 2019-02-20 MED ORDER — SODIUM CHLORIDE 0.9 % IV SOLN: 500.0000 mL | Freq: Once | INTRAVENOUS | Status: DC

## 2019-02-20 NOTE — Progress Notes (Signed)
Pt's states no medical or surgical changes since previsit or office visit.  JB - temp CW - vitals. 

## 2019-02-20 NOTE — Progress Notes (Signed)
Called to room to assist during endoscopic procedure.  Patient ID and intended procedure confirmed with present staff. Received instructions for my participation in the procedure from the performing physician.  

## 2019-02-20 NOTE — Op Note (Signed)
[facility] Patient Name: Chastin Schaible Procedure Date: 02/20/2019 8:34 AM MRN: [id number] Endoscopist: Mauri Pole , MD Age: 68 Referring MD:  Date of Birth: 1950/12/15 Gender: Male Account #: [id number] Procedure:                Colonoscopy Indications:              Screening for colorectal malignant neoplasm Medicines:                Monitored Anesthesia Care Procedure:                Pre-Anesthesia Assessment:                           - Prior to the procedure, a History and Physical                            was performed, and patient medications and                            allergies were reviewed. The patient's tolerance of                            previous anesthesia was also reviewed. The risks                            and benefits of the procedure and the sedation                            options and risks were discussed with the patient.                            All questions were answered, and informed consent                            was obtained. Prior Anticoagulants: The patient has                            taken no previous anticoagulant or antiplatelet                            agents. ASA Grade Assessment: III - A patient with                            severe systemic disease. After reviewing the risks                            and benefits, the patient was deemed in                            satisfactory condition to undergo the procedure.                           After obtaining informed consent, the colonoscope  was passed under direct vision. Throughout the                            procedure, the patient's blood pressure, pulse, and                            oxygen saturations were monitored continuously. The                            Colonoscope was introduced through the anus and                            advanced to the the cecum, identified by                            appendiceal orifice  and ileocecal valve. The                            colonoscopy was performed without difficulty. The                            patient tolerated the procedure well. The quality                            of the bowel preparation was excellent. The                            ileocecal valve, appendiceal orifice, and rectum                            were photographed. Scope In: 8:42:34 AM Scope Out: 9:00:39 AM Scope Withdrawal Time: 0 hours 11 minutes 48 seconds  Total Procedure Duration: 0 hours 18 minutes 5 seconds  Findings:                 The perianal and digital rectal examinations were                            normal.                           A 8 mm polyp was found in the transverse colon. The                            polyp was sessile. The polyp was removed with a                            cold snare. Resection and retrieval were complete.                           Non-bleeding internal hemorrhoids were found during                            retroflexion. The hemorrhoids were small. Complications:  No immediate complications. Estimated Blood Loss:     Estimated blood loss was minimal. Impression:               - One 8 mm polyp in the transverse colon, removed                            with a cold snare. Resected and retrieved.                           - Non-bleeding internal hemorrhoids. Recommendation:           - Patient has a contact number available for                            emergencies. The signs and symptoms of potential                            delayed complications were discussed with the                            patient. Return to normal activities tomorrow.                            Written discharge instructions were provided to the                            patient.                           - Resume previous diet.                           - Continue present medications.                           - Await pathology results.                            - Repeat colonoscopy in 5-10 years for surveillance                            based on pathology results. Mauri Pole, MD 02/20/2019 9:05:05 AM This report has been signed electronically.

## 2019-02-20 NOTE — Progress Notes (Signed)
Report to PACU, RN, vss, BBS= Clear.  

## 2019-02-20 NOTE — Patient Instructions (Signed)
YOU HAD AN ENDOSCOPIC PROCEDURE TODAY AT THE [facility]:   Refer to the procedure report that was given to you for any specific questions about what was found during the examination.  If the procedure report does not answer your questions, please call your gastroenterologist to clarify.  If you requested that your care partner not be given the details of your procedure findings, then the procedure report has been included in a sealed envelope for you to review at your convenience later.  YOU SHOULD EXPECT: Some feelings of bloating in the abdomen. Passage of more gas than usual.  Walking can help get rid of the air that was put into your GI tract during the procedure and reduce the bloating. If you had a lower endoscopy (such as a colonoscopy or flexible sigmoidoscopy) you may notice spotting of blood in your stool or on the toilet paper. If you underwent a bowel prep for your procedure, you may not have a normal bowel movement for a few days.  Please Note:  You might notice some irritation and congestion in your nose or some drainage.  This is from the oxygen used during your procedure.  There is no need for concern and it should clear up in a day or so.  SYMPTOMS TO REPORT IMMEDIATELY:   Following lower endoscopy (colonoscopy or flexible sigmoidoscopy):  Excessive amounts of blood in the stool  Significant tenderness or worsening of abdominal pains  Swelling of the abdomen that is new, acute  Fever of 100F or higher  For urgent or emergent issues, a gastroenterologist can be reached at any hour by calling [phone].   DIET:  We do recommend a small meal at first, but then you may proceed to your regular diet.  Drink plenty of fluids but you should avoid alcoholic beverages for 24 hours.  ACTIVITY:  You should plan to take it easy for the rest of today and you should NOT DRIVE or use heavy machinery until tomorrow (because of the sedation medicines used during the test).     FOLLOW UP: Our staff will call the number listed on your records 48-72 hours following your procedure to check on you and address any questions or concerns that you may have regarding the information given to you following your procedure. If we do not reach you, we will leave a message.  We will attempt to reach you two times.  During this call, we will ask if you have developed any symptoms of COVID 19. If you develop any symptoms (ie: fever, flu-like symptoms, shortness of breath, cough etc.) before then, please call [phone].  If you test positive for Covid 19 in the 2 weeks post procedure, please call and report this information to us.    If any biopsies were taken you will be contacted by phone or by letter within the next 1-3 weeks.  Please call us at [phone] if you have not heard about the biopsies in 3 weeks.    SIGNATURES/CONFIDENTIALITY: You and/or your care partner have signed paperwork which will be entered into your electronic medical record.  These signatures attest to the fact that that the information above on your After Visit Summary has been reviewed and is understood.  Full responsibility of the confidentiality of this discharge information lies with you and/or your care-partner. 

## 2019-02-22 ENCOUNTER — Telehealth: Payer: Self-pay | Admitting: *Deleted

## 2019-02-22 NOTE — Telephone Encounter (Signed)
1. Have you developed a fever since your procedure? no  2.   Have you had an respiratory symptoms (SOB or cough) since your procedure? no  3.   Have you tested positive for COVID 19 since your procedure no  4.   Have you had any family members/close contacts diagnosed with the COVID 19 since your procedure?  no   If yes to any of these questions please route to Joylene John, RN and Alphonsa Gin, Therapist, sports.  Follow up Call-  Call back number 02/20/2019  Post procedure Call Back phone  # [phone]  Permission to leave phone message Yes  Some recent data might be hidden     Patient questions:  Do you have a fever, pain , or abdominal swelling? No. Pain Score  0 *  Have you tolerated food without any problems? Yes.    Have you been able to return to your normal activities? Yes.    Do you have any questions about your discharge instructions: Diet   No. Medications  No. Follow up visit  No.  Do you have questions or concerns about your Care? No.  Actions: * If pain score is 4 or above: No action needed, pain <4.

## 2019-02-25 ENCOUNTER — Encounter: Payer: Self-pay | Admitting: Gastroenterology

## 2019-04-16 ENCOUNTER — Other Ambulatory Visit: Payer: Self-pay

## 2019-04-16 ENCOUNTER — Encounter: Payer: Self-pay | Admitting: Family Medicine

## 2019-04-16 ENCOUNTER — Ambulatory Visit: Payer: Medicare HMO | Attending: Family Medicine | Admitting: Family Medicine

## 2019-04-16 DIAGNOSIS — E11649 Type 2 diabetes mellitus with hypoglycemia without coma: Secondary | ICD-10-CM

## 2019-04-16 DIAGNOSIS — E7800 Pure hypercholesterolemia, unspecified: Secondary | ICD-10-CM | POA: Diagnosis not present

## 2019-04-16 DIAGNOSIS — Z794 Long term (current) use of insulin: Secondary | ICD-10-CM

## 2019-04-16 MED ORDER — HUMULIN 70/30 (70-30) 100 UNIT/ML ~~LOC~~ SUSP: SUBCUTANEOUS | 3 refills | Status: DC

## 2019-04-16 MED FILL — ?HUMULIN 70/30 VIAL: (70-30) 100 | 31 days supply | Qty: 20 | Fill #0

## 2019-04-16 NOTE — Progress Notes (Signed)
Patient has been called and DOB has been verified. Patient has been screened and transferred to PCP to start phone visit.  Wakes up with blood sugars between 70-110.

## 2019-04-16 NOTE — Progress Notes (Signed)
Virtual Visit via Telephone Note  I connected with Caleb [name], on 04/16/2019 at 8:43 AM by telephone due to the COVID-19 pandemic and verified that I am speaking with the correct person using two identifiers.   Consent: I discussed the limitations, risks, security and privacy concerns of performing an evaluation and management service by telephone and the availability of in person appointments. I also discussed with the patient that there may be a patient responsible charge related to this service. The patient expressed understanding and agreed to proceed.   Location of Patient: In the car  Location of Provider: Clinic   Persons participating in Telemedicine visit: Torrian Canion Farrington-CMA Dr. Margarita Rana     History of Present Illness: Caleb [name] is a 69 year old male with a history of type 2 diabetes mellitus (A1c7.6), hypertension, hyperlipidemia who presents today for a follow-up visit.  Blood sugars have been between 70 and 110 with lowest sugars at 64 and highest 154.  In those instances with a low sugar he has taken a snack to bring up his blood sugar; denies hypoglycemic symptoms. He rides his indoor bicycle often. His last eye exam was over a year ago with Dr Katy Fitch. He does not have additional concerns today. Past Medical History:  Diagnosis Date  . Diabetes mellitus without complication ([place])   . Dyslipidemia 09/27/2012  . Erectile dysfunction 09/27/2012  . Heart murmur   . Hypercholesteremia   . Hyperlipidemia 08/12/2012  . Hypertension   . Hyponatremia 08/14/2012  . Neuropathy   . Pancreatitis   . Pancreatitis, acute 09/27/2012  . Vitamin D deficiency 06/23/2015   No Known Allergies  Current Outpatient Medications on File Prior to Visit  Medication Sig Dispense Refill  . Blood Glucose Monitoring Suppl (ONETOUCH VERIO) w/Device KIT 1 each by Does not apply route 2 (two) times daily. Use as instructed twice daily, check fasting sugar in morning and check  before bedtime. E11.8 1 kit 0  . Continuous Blood Gluc Receiver (FREESTYLE LIBRE 14 DAY READER) DEVI 1 each by Does not apply route See admin instructions. Use as directed to check blood glucose 1 Device 0  . Continuous Blood Gluc Sensor (FREESTYLE LIBRE 14 DAY SENSOR) MISC Inject 1 each into the skin every 14 (fourteen) days. E11.69 2 each 2  . gabapentin (NEURONTIN) 300 MG capsule Take 1 capsule (300 mg total) by mouth 2 (two) times daily. 180 capsule 1  . glucose blood test strip Use as instructed twice daily, check fasting sugar in morning and check before bedtime. E11.8 100 each 12  . HUMULIN 70/30 (70-30) 100 UNIT/ML injection INJECT 32 UNITS INTO THE SKIN 2 TIMES DAILY WITH A MEAL. 60 mL 3  . lisinopril-hydrochlorothiazide (ZESTORETIC) 20-12.5 MG tablet Take 2 tablets by mouth daily. 180 tablet 1  . metFORMIN (GLUCOPHAGE) 1000 MG tablet Take 1 tablet (1,000 mg total) by mouth 2 (two) times daily with a meal. 360 tablet 1  . metoprolol tartrate (LOPRESSOR) 100 MG tablet Take 1 tablet (100 mg total) by mouth 2 (two) times daily. 180 tablet 1  . ONETOUCH DELICA LANCETS 16X MISC Use as instructed twice daily, check fasting sugar in morning and check before bedtime. E11.8 100 each 12  . sildenafil (VIAGRA) 100 MG tablet TAKE 1 TABLET BY MOUTH DAILY AS NEEDED FOR ERECTILE DYSFUNCTION 10 tablet 3  . rosuvastatin (CRESTOR) 40 MG tablet Take 1 tablet (40 mg total) by mouth daily. To lower cholesterol (Patient not taking: Reported on 02/20/2019) 90 tablet 1  .  Vitamin D, Ergocalciferol, (DRISDOL) 50000 units CAPS capsule Take 1 capsule (50,000 Units total) by mouth every 7 (seven) days. (Patient not taking: Reported on 02/20/2019) 12 capsule 0   No current facility-administered medications on file prior to visit.    Observations/Objective: Awake, alert, oriented x3 Not in acute distress  CMP Latest Ref Rng & Units 01/15/2019 02/08/2018 09/28/2017  Glucose 65 - 99 mg/dL 136(H) 174(H) 232(H)  BUN 8 -  27 mg/dL 11 14 28(H)  Creatinine 0.76 - 1.27 mg/dL 1.08 0.90 1.21  Sodium 134 - 144 mmol/L 140 138 135  Potassium 3.5 - 5.2 mmol/L 5.1 4.7 4.9  Chloride 96 - 106 mmol/L 101 98 97  CO2 20 - 29 mmol/L '26 24 22  ' Calcium 8.6 - 10.2 mg/dL 9.8 9.2 9.4  Total Protein 6.0 - 8.5 g/dL 7.3 7.0 7.1  Total Bilirubin 0.0 - 1.2 mg/dL 0.5 0.3 0.3  Alkaline Phos 39 - 117 IU/L 176(H) 161(H) 183(H)  AST 0 - 40 IU/L '28 23 21  ' ALT 0 - 44 IU/L 46(H) 24 38    Lipid Panel     Component Value Date/Time   CHOL 129 01/15/2019 0945   TRIG 115 01/15/2019 0945   HDL 47 01/15/2019 0945   CHOLHDL 2.7 01/15/2019 0945   CHOLHDL 3.9 04/02/2013 1632   VLDL 66 (H) 04/02/2013 1632   LDLCALC 61 01/15/2019 0945   LABVLDL 21 01/15/2019 0945     Assessment and Plan: 1. Type 2 diabetes mellitus with hypoglycemia without coma, with long-term current use of insulin (HCC) Controlled with A1c of 7.7 His goal A1c is less than 7.5 to prevent hypoglycemia Advised to decrease from 32 units to 30 units twice daily in the event of hypoglycemia A1c, foot exam is due at next visit  Reminded to schedule ophthalmology exam Counseled on Diabetic diet, my plate method, 826 minutes of moderate intensity exercise/week Blood sugar logs with fasting goals of 80-120 mg/dl, random of less than 180 and in the event of sugars less than 60 mg/dl or greater than 400 mg/dl encouraged to notify the clinic. Advised on the need for annual eye exams, annual foot exams, Pneumonia vaccine. - HUMULIN 70/30 (70-30) 100 UNIT/ML injection; INJECT 32 UNITS INTO THE SKIN 2 TIMES DAILY WITH A MEAL.  Dispense: 60 mL; Refill: 3  2. Pure hypercholesterolemia Controlled Continue Crestor   Follow Up Instructions: 3 months-in person   I discussed the assessment and treatment plan with the patient. The patient was provided an opportunity to ask questions and all were answered. The patient agreed with the plan and demonstrated an understanding of the  instructions.   The patient was advised to call back or seek an in-person evaluation if the symptoms worsen or if the condition fails to improve as anticipated.     I provided 13 minutes total of non-face-to-face time during this encounter including median intraservice time, reviewing previous notes, investigations, ordering medications, medical decision making, coordinating care and patient verbalized understanding at the end of the visit.     [name], MD, FAAFP. [facility] [place], [place]   04/16/2019, 8:43 AM

## 2019-04-25 ENCOUNTER — Other Ambulatory Visit: Payer: Self-pay | Admitting: Family Medicine

## 2019-04-25 MED FILL — GABAPENTIN 300 MG CAPSULE: 300 | 30 days supply | Qty: 60 | Fill #1

## 2019-04-25 MED FILL — LISINOPRIL-HCTZ 20-12.5 MG: 20-12.5 | 30 days supply | Qty: 60 | Fill #1

## 2019-04-25 MED FILL — ?METOPROLOL 100 MG TABLET: 100 | 30 days supply | Qty: 60 | Fill #1

## 2019-04-25 MED FILL — metFORMIN HCL 1000 MG TABS: 1000 | 30 days supply | Qty: 60 | Fill #1

## 2019-04-25 MED FILL — ?HUMULIN 70/30 VIAL: (70-30) 100 | 31 days supply | Qty: 20 | Fill #0

## 2019-04-25 MED FILL — ?ROSUVASTATIN CALCIUM 40MG: 40 | 30 days supply | Qty: 30 | Fill #1

## 2019-04-26 ENCOUNTER — Telehealth: Payer: Self-pay | Admitting: Family Medicine

## 2019-04-26 ENCOUNTER — Encounter: Payer: Self-pay | Admitting: General Practice

## 2019-04-26 MED FILL — SILDENAFIL CITRATE 50 MG TA: 50 | 10 days supply | Qty: 20 | Fill #0

## 2019-04-26 NOTE — Telephone Encounter (Signed)
Patient stopped by the office wondering if he could get a letter of necessity send to [facility], regarding a lee brace sensor. Please fu at your earliest convenience.

## 2019-04-29 NOTE — Telephone Encounter (Signed)
Letter has been written. 

## 2019-04-29 NOTE — Telephone Encounter (Signed)
Patient is needing a letter for his insurance company for his use of the freestyle libra meter and not a regular glucometer. He states that he uses the freestyle meter due to his neuropathy in his fingers.

## 2019-05-02 NOTE — Telephone Encounter (Signed)
Letter has been faxed over to [employer] 

## 2019-05-24 ENCOUNTER — Other Ambulatory Visit: Payer: Self-pay | Admitting: Family Medicine

## 2019-05-24 DIAGNOSIS — Z794 Long term (current) use of insulin: Secondary | ICD-10-CM

## 2019-05-24 DIAGNOSIS — E1169 Type 2 diabetes mellitus with other specified complication: Secondary | ICD-10-CM

## 2019-07-09 ENCOUNTER — Other Ambulatory Visit: Payer: Self-pay

## 2019-07-09 ENCOUNTER — Ambulatory Visit: Payer: Medicare HMO | Admitting: Podiatry

## 2019-07-09 DIAGNOSIS — L089 Local infection of the skin and subcutaneous tissue, unspecified: Secondary | ICD-10-CM | POA: Diagnosis not present

## 2019-07-09 DIAGNOSIS — S90829A Blister (nonthermal), unspecified foot, initial encounter: Secondary | ICD-10-CM | POA: Diagnosis not present

## 2019-07-09 DIAGNOSIS — E1142 Type 2 diabetes mellitus with diabetic polyneuropathy: Secondary | ICD-10-CM

## 2019-07-09 MED ORDER — AMOXICILLIN-POT CLAVULANATE 500-125 MG PO TABS: 1.0000 | ORAL_TABLET | Freq: Three times a day (TID) | ORAL | 0 refills | Status: AC

## 2019-07-09 MED ORDER — SILVER SULFADIAZINE 1 % EX CREA: TOPICAL_CREAM | CUTANEOUS | 0 refills | Status: DC

## 2019-07-09 MED FILL — AMOX-CLAV 500-125 MG TABLET: 500-125 | 10 days supply | Qty: 30 | Fill #0

## 2019-07-09 MED FILL — SSD 1% CREAM: 1 | 14 days supply | Qty: 50 | Fill #0

## 2019-07-09 NOTE — Patient Instructions (Addendum)
DRESSING CHANGES LEFT FOOT:  WEAR SURGICAL SHOE AT ALL TIMES    1. KEEP LEFT  FOOT DRY AT ALL TIMES!!!!  2. CLEANSE ULCER WITH SALINE.  3. DAB DRY WITH GAUZE SPONGE.  4. APPLY A LIGHT AMOUNT OF SILVADENE CREAM TO BASE OF ULCER.  5. APPLY OUTER DRESSING/BAND-AID AS INSTRUCTED.  6. WEAR SURGICAL SHOE DAILY AT ALL TIMES.  7. DO NOT WALK BAREFOOT!!!  8.  IF YOU EXPERIENCE ANY FEVER, CHILLS, NIGHTSWEATS, NAUSEA OR VOMITING, ELEVATED OR LOW BLOOD SUGARS, REPORT TO EMERGENCY ROOM.  9. IF YOU EXPERIENCE INCREASED REDNESS, PAIN, SWELLING, DISCOLORATION, ODOR, PUS, DRAINAGE OR WARMTH OF YOUR FOOT, REPORT TO EMERGENCY ROOM.   Diabetes Mellitus and Foot Care Foot care is an important part of your health, especially when you have diabetes. Diabetes may cause you to have problems because of poor blood flow (circulation) to your feet and legs, which can cause your skin to:  Become thinner and drier.  Break more easily.  Heal more slowly.  Peel and crack. You may also have nerve damage (neuropathy) in your legs and feet, causing decreased feeling in them. This means that you may not notice minor injuries to your feet that could lead to more serious problems. Noticing and addressing any potential problems early is the best way to prevent future foot problems. How to care for your feet Foot hygiene  Wash your feet daily with warm water and mild soap. Do not use hot water. Then, pat your feet and the areas between your toes until they are completely dry. Do not soak your feet as this can dry your skin.  Trim your toenails straight across. Do not dig under them or around the cuticle. File the edges of your nails with an emery board or nail file.  Apply a moisturizing lotion or petroleum jelly to the skin on your feet and to dry, brittle toenails. Use lotion that does not contain alcohol and is unscented. Do not apply lotion between your toes. Shoes and socks  Wear clean socks or stockings  every day. Make sure they are not too tight. Do not wear knee-high stockings since they may decrease blood flow to your legs.  Wear shoes that fit properly and have enough cushioning. Always look in your shoes before you put them on to be sure there are no objects inside.  To break in new shoes, wear them for just a few hours a day. This prevents injuries on your feet. Wounds, scrapes, corns, and calluses  Check your feet daily for blisters, cuts, bruises, sores, and redness. If you cannot see the bottom of your feet, use a mirror or ask someone for help.  Do not cut corns or calluses or try to remove them with medicine.  If you find a minor scrape, cut, or break in the skin on your feet, keep it and the skin around it clean and dry. You may clean these areas with mild soap and water. Do not clean the area with peroxide, alcohol, or iodine.  If you have a wound, scrape, corn, or callus on your foot, look at it several times a day to make sure it is healing and not infected. Check for: ? Redness, swelling, or pain. ? Fluid or blood. ? Warmth. ? Pus or a bad smell. General instructions  Do not cross your legs. This may decrease blood flow to your feet.  Do not use heating pads or hot water bottles on your feet. They may burn your  skin. If you have lost feeling in your feet or legs, you may not know this is happening until it is too late.  Protect your feet from hot and cold by wearing shoes, such as at the beach or on hot pavement.  Schedule a complete foot exam at least once a year (annually) or more often if you have foot problems. If you have foot problems, report any cuts, sores, or bruises to your health care provider immediately. Contact a health care provider if:  You have a medical condition that increases your risk of infection and you have any cuts, sores, or bruises on your feet.  You have an injury that is not healing.  You have redness on your legs or feet.  You feel  burning or tingling in your legs or feet.  You have pain or cramps in your legs and feet.  Your legs or feet are numb.  Your feet always feel cold.  You have pain around a toenail. Get help right away if:  You have a wound, scrape, corn, or callus on your foot and: ? You have pain, swelling, or redness that gets worse. ? You have fluid or blood coming from the wound, scrape, corn, or callus. ? Your wound, scrape, corn, or callus feels warm to the touch. ? You have pus or a bad smell coming from the wound, scrape, corn, or callus. ? You have a fever. ? You have a red line going up your leg. Summary  Check your feet every day for cuts, sores, red spots, swelling, and blisters.  Moisturize feet and legs daily.  Wear shoes that fit properly and have enough cushioning.  If you have foot problems, report any cuts, sores, or bruises to your health care provider immediately.  Schedule a complete foot exam at least once a year (annually) or more often if you have foot problems. This information is not intended to replace advice given to you by your health care provider. Make sure you discuss any questions you have with your health care provider. Document Revised: 11/28/2018 Document Reviewed: 04/08/2016 Elsevier Patient Education  [place].  5.

## 2019-07-10 ENCOUNTER — Encounter: Payer: Self-pay | Admitting: Podiatry

## 2019-07-10 NOTE — Progress Notes (Signed)
Subjective:   [name] presents with known h/o diabetes and neuropathy for cc of blistering digits of both feet. Patient states he wore the "wrong shoes" which were dispensed to him from the [place] on Sunday. He proceeded to walk approximately 5 miles for exercise at [facility] on Sunday. He states by Sunday night, he had discomfort in both feet and blistering of nearly all digits of the left foot. Also feels toenails are very loose on affected digits. He denies any fever, chills, nightsweats, nausea or vomiting. He has attempted no treatment.   He states he now has the [employer] for his glucose monitoring.  Last visit to clinic: May, 2020.  Current Outpatient Medications on File Prior to Visit  Medication Sig Dispense Refill  . Blood Glucose Monitoring Suppl (ONETOUCH VERIO) w/Device KIT 1 each by Does not apply route 2 (two) times daily. Use as instructed twice daily, check fasting sugar in morning and check before bedtime. E11.8 1 kit 0  . Continuous Blood Gluc Receiver (FREESTYLE LIBRE 14 DAY READER) DEVI 1 each by Does not apply route See admin instructions. Use as directed to check blood glucose 1 Device 0  . Continuous Blood Gluc Sensor (FREESTYLE LIBRE 14 DAY SENSOR) MISC INJECT 1 EACH INTO THE SKIN EVERY 14 (FOURTEEN) DAYS. E11.69 2 each 2  . gabapentin (NEURONTIN) 300 MG capsule Take 1 capsule (300 mg total) by mouth 2 (two) times daily. 180 capsule 1  . glucose blood test strip Use as instructed twice daily, check fasting sugar in morning and check before bedtime. E11.8 100 each 12  . HUMULIN 70/30 (70-30) 100 UNIT/ML injection INJECT 32 UNITS INTO THE SKIN 2 TIMES DAILY WITH A MEAL. 60 mL 3  . lisinopril-hydrochlorothiazide (ZESTORETIC) 20-12.5 MG tablet Take 2 tablets by mouth daily. 180 tablet 1  . metFORMIN (GLUCOPHAGE) 1000 MG tablet Take 1 tablet (1,000 mg total) by mouth 2 (two) times daily with a meal. 360 tablet 1  . metoprolol tartrate (LOPRESSOR) 100 MG  tablet Take 1 tablet (100 mg total) by mouth 2 (two) times daily. 180 tablet 1  . ONETOUCH DELICA LANCETS 10X MISC Use as instructed twice daily, check fasting sugar in morning and check before bedtime. E11.8 100 each 12  . rosuvastatin (CRESTOR) 40 MG tablet Take 1 tablet (40 mg total) by mouth daily. To lower cholesterol (Patient not taking: Reported on 02/20/2019) 90 tablet 1  . sildenafil (VIAGRA) 50 MG tablet TAKE 2 TABLETS BY MOUTH DAILY AS NEEDED FOR ERECTILE DYSFUNCTION 20 tablet 1  . Vitamin D, Ergocalciferol, (DRISDOL) 50000 units CAPS capsule Take 1 capsule (50,000 Units total) by mouth every 7 (seven) days. (Patient not taking: Reported on 02/20/2019) 12 capsule 0   No current facility-administered medications on file prior to visit.     No Known Allergies   Objective:   Patient afebrile: temperature 96.7 degrees Fahrenheit  Vascular Examination: Capillary fill time to digits <3 seconds b/l. Palpable DP pulses b/l. Palpable PT pulses b/l. Pedal hair sparse b/l. He has some slight warmth to left forefoot area with mild erythema.  Dermatological Examination: Pedal skin with normal turgor, texture and tone bilaterally. No interdigital macerations bilaterally. Bilateral feet with multiple blisters of digits 1-4 left, 1-3 right foot. Skin is sloughing off and toenails are completely detached. Injury appears to only affect epidermis of all digits. Left 4th digit with approxiately 3 cc's of serous fluid expressed.         Musculoskeletal Examination: Normal  muscle strength 5/5 to all lower extremity muscle groups bilaterally, no pain crepitus or joint limitation noted with ROM b/l and bunion deformity noted b/l.  Neurological Examination: Protective sensation diminished with 10g monofilament b/l. Vibratory sensation absent b/l Proprioception intact bilaterally.  Assessment:   1.  Multiple blisters of both feet with infection of left foot 2.  NIDDM with  neuropathy  Plan: 1. Discussed diagnosis and treatment options with [name]. Patient agreed with treatment plan. 2. Xray was not taken today as there was no frank abscess/bony involvement suspected from clinical examination.  3. Betadine Prep applied to digits and sloughing skin gently debrided. Silvadene Cream applied to affected digits. Dressing applied to left foot and bandaids applied to digits of right foot.  4. Rx for Silvadene Cream to be applied to all digits once daily.  5. Rx for Augmentin 500/125 mg po tid for 10 days sent to pharmacy. 6. Surgical shoe was dispensed for left foot. 7. Patient was given written instructions on dressing changes and was instructed to call immediately if any signs or symptoms of infection arise.  8. Patient is to follow up with me in one week. 9. Patient instructed to report to emergency department with worsening appearance of ulcer/toe/foot, increased pain, foul odor, increased redness, swelling, drainage, fever, chills, nightsweats, nausea, vomiting, significantly decreased/increased blood sugars.  10. Patient/POA related understanding of treatment plan on today's visit. All questions/concerns addressed.   Return in about 1 week (around 07/16/2019) for multiple digits of both feet.

## 2019-07-16 ENCOUNTER — Ambulatory Visit (INDEPENDENT_AMBULATORY_CARE_PROVIDER_SITE_OTHER): Payer: Medicare HMO | Admitting: Podiatry

## 2019-07-16 ENCOUNTER — Encounter: Payer: Self-pay | Admitting: Podiatry

## 2019-07-16 ENCOUNTER — Other Ambulatory Visit: Payer: Self-pay

## 2019-07-16 VITALS — Temp 96.9°F

## 2019-07-16 DIAGNOSIS — S91109D Unspecified open wound of unspecified toe(s) without damage to nail, subsequent encounter: Secondary | ICD-10-CM

## 2019-07-16 DIAGNOSIS — E1142 Type 2 diabetes mellitus with diabetic polyneuropathy: Secondary | ICD-10-CM

## 2019-07-16 NOTE — Patient Instructions (Addendum)
Caleb [name], the dark spots on your toes are eschar. No evidence of gangrene on examination today.  We will schedule arterial studies to evaluate your circulation, so you may receive a call from our office or the Vascular office in the next few days to schedule that appointment.   Continue all antibiotics until they are gone.  Follow up in one week.  DRESSING CHANGES LEFT FOOT:  WEAR SURGICAL SHOE AT ALL TIMES   1. KEEP LEFT  FOOT DRY AT ALL TIMES!!!!  2. CLEANSE ULCER WITH SALINE.  3. DAB DRY WITH GAUZE SPONGE.  4. APPLY A LIGHT AMOUNT OF SILVADINE TO TOES.  5. APPLY OUTER DRESSING/BAND-AID AS INSTRUCTED.  6. WEAR SURGICAL SHOE DAILY AT ALL TIMES.  7. DO NOT WALK BAREFOOT!!!  8.  IF YOU EXPERIENCE ANY FEVER, CHILLS, NIGHTSWEATS, NAUSEA OR VOMITING, ELEVATED OR LOW BLOOD SUGARS, REPORT TO EMERGENCY ROOM.  9. IF YOU EXPERIENCE INCREASED REDNESS, PAIN, SWELLING, DISCOLORATION, ODOR, PUS, DRAINAGE OR WARMTH OF YOUR FOOT, REPORT TO EMERGENCY ROOM.   Diabetes Mellitus and Foot Care Foot care is an important part of your health, especially when you have diabetes. Diabetes may cause you to have problems because of poor blood flow (circulation) to your feet and legs, which can cause your skin to:  Become thinner and drier.  Break more easily.  Heal more slowly.  Peel and crack. You may also have nerve damage (neuropathy) in your legs and feet, causing decreased feeling in them. This means that you may not notice minor injuries to your feet that could lead to more serious problems. Noticing and addressing any potential problems early is the best way to prevent future foot problems. How to care for your feet Foot hygiene  Wash your feet daily with warm water and mild soap. Do not use hot water. Then, pat your feet and the areas between your toes until they are completely dry. Do not soak your feet as this can dry your skin.  Trim your toenails straight across. Do not dig under  them or around the cuticle. File the edges of your nails with an emery board or nail file.  Apply a moisturizing lotion or petroleum jelly to the skin on your feet and to dry, brittle toenails. Use lotion that does not contain alcohol and is unscented. Do not apply lotion between your toes. Shoes and socks  Wear clean socks or stockings every day. Make sure they are not too tight. Do not wear knee-high stockings since they may decrease blood flow to your legs.  Wear shoes that fit properly and have enough cushioning. Always look in your shoes before you put them on to be sure there are no objects inside.  To break in new shoes, wear them for just a few hours a day. This prevents injuries on your feet. Wounds, scrapes, corns, and calluses  Check your feet daily for blisters, cuts, bruises, sores, and redness. If you cannot see the bottom of your feet, use a mirror or ask someone for help.  Do not cut corns or calluses or try to remove them with medicine.  If you find a minor scrape, cut, or break in the skin on your feet, keep it and the skin around it clean and dry. You may clean these areas with mild soap and water. Do not clean the area with peroxide, alcohol, or iodine.  If you have a wound, scrape, corn, or callus on your foot, look at it several times a day  to make sure it is healing and not infected. Check for: ? Redness, swelling, or pain. ? Fluid or blood. ? Warmth. ? Pus or a bad smell. General instructions  Do not cross your legs. This may decrease blood flow to your feet.  Do not use heating pads or hot water bottles on your feet. They may burn your skin. If you have lost feeling in your feet or legs, you may not know this is happening until it is too late.  Protect your feet from hot and cold by wearing shoes, such as at the beach or on hot pavement.  Schedule a complete foot exam at least once a year (annually) or more often if you have foot problems. If you have foot  problems, report any cuts, sores, or bruises to your health care provider immediately. Contact a health care provider if:  You have a medical condition that increases your risk of infection and you have any cuts, sores, or bruises on your feet.  You have an injury that is not healing.  You have redness on your legs or feet.  You feel burning or tingling in your legs or feet.  You have pain or cramps in your legs and feet.  Your legs or feet are numb.  Your feet always feel cold.  You have pain around a toenail. Get help right away if:  You have a wound, scrape, corn, or callus on your foot and: ? You have pain, swelling, or redness that gets worse. ? You have fluid or blood coming from the wound, scrape, corn, or callus. ? Your wound, scrape, corn, or callus feels warm to the touch. ? You have pus or a bad smell coming from the wound, scrape, corn, or callus. ? You have a fever. ? You have a red line going up your leg. Summary  Check your feet every day for cuts, sores, red spots, swelling, and blisters.  Moisturize feet and legs daily.  Wear shoes that fit properly and have enough cushioning.  If you have foot problems, report any cuts, sores, or bruises to your health care provider immediately.  Schedule a complete foot exam at least once a year (annually) or more often if you have foot problems. This information is not intended to replace advice given to you by your health care provider. Make sure you discuss any questions you have with your health care provider. Document Revised: 11/28/2018 Document Reviewed: 04/08/2016 Elsevier Patient Education  [place].

## 2019-07-18 ENCOUNTER — Ambulatory Visit: Payer: Medicare HMO | Attending: Family Medicine | Admitting: Family Medicine

## 2019-07-18 ENCOUNTER — Telehealth: Payer: Self-pay | Admitting: *Deleted

## 2019-07-18 ENCOUNTER — Other Ambulatory Visit: Payer: Self-pay

## 2019-07-18 DIAGNOSIS — Z794 Long term (current) use of insulin: Secondary | ICD-10-CM | POA: Diagnosis not present

## 2019-07-18 DIAGNOSIS — I10 Essential (primary) hypertension: Secondary | ICD-10-CM | POA: Diagnosis not present

## 2019-07-18 DIAGNOSIS — E7800 Pure hypercholesterolemia, unspecified: Secondary | ICD-10-CM | POA: Diagnosis not present

## 2019-07-18 DIAGNOSIS — E1169 Type 2 diabetes mellitus with other specified complication: Secondary | ICD-10-CM

## 2019-07-18 DIAGNOSIS — S91109D Unspecified open wound of unspecified toe(s) without damage to nail, subsequent encounter: Secondary | ICD-10-CM

## 2019-07-18 DIAGNOSIS — E1142 Type 2 diabetes mellitus with diabetic polyneuropathy: Secondary | ICD-10-CM

## 2019-07-18 DIAGNOSIS — E11649 Type 2 diabetes mellitus with hypoglycemia without coma: Secondary | ICD-10-CM

## 2019-07-18 DIAGNOSIS — L089 Local infection of the skin and subcutaneous tissue, unspecified: Secondary | ICD-10-CM

## 2019-07-18 MED ORDER — HUMULIN 70/30 (70-30) 100 UNIT/ML ~~LOC~~ SUSP: SUBCUTANEOUS | 6 refills | Status: DC

## 2019-07-18 MED ORDER — METOPROLOL TARTRATE 100 MG PO TABS: 100.0000 mg | ORAL_TABLET | Freq: Two times a day (BID) | ORAL | 1 refills | Status: DC

## 2019-07-18 MED ORDER — GABAPENTIN 300 MG PO CAPS: 300.0000 mg | ORAL_CAPSULE | Freq: Two times a day (BID) | ORAL | 1 refills | Status: DC

## 2019-07-18 MED ORDER — LISINOPRIL-HYDROCHLOROTHIAZIDE 20-12.5 MG PO TABS: 2.0000 | ORAL_TABLET | Freq: Every day | ORAL | 1 refills | Status: DC

## 2019-07-18 MED ORDER — SILDENAFIL CITRATE 50 MG PO TABS: ORAL_TABLET | ORAL | 1 refills | Status: DC

## 2019-07-18 MED ORDER — ROSUVASTATIN CALCIUM 40 MG PO TABS: 40.0000 mg | ORAL_TABLET | Freq: Every day | ORAL | 1 refills | Status: DC

## 2019-07-18 MED ORDER — METFORMIN HCL 1000 MG PO TABS: 1000.0000 mg | ORAL_TABLET | Freq: Two times a day (BID) | ORAL | 1 refills | Status: DC

## 2019-07-18 MED FILL — ?HUMULIN 70/30 VIAL: (70-30) 100 | 27 days supply | Qty: 20 | Fill #0

## 2019-07-18 MED FILL — LISINOPRIL-HCTZ 20-12.5 MG: 20-12.5 | 30 days supply | Qty: 60 | Fill #0

## 2019-07-18 MED FILL — ?METOPROLOL TART 100 MG TAB: 100 | 30 days supply | Qty: 60 | Fill #0

## 2019-07-18 MED FILL — SILDENAFIL CITRATE 50 MG TA: 50 | 30 days supply | Qty: 20 | Fill #0

## 2019-07-18 MED FILL — GABAPENTIN 300 MG CAPSULE: 300 | 30 days supply | Qty: 60 | Fill #0

## 2019-07-18 MED FILL — ROSUVASTATIN CALCIUM 40 MG: 40 | 30 days supply | Qty: 30 | Fill #0

## 2019-07-18 MED FILL — metFORMIN HCL 1000 MG TABS: 1000 | 30 days supply | Qty: 60 | Fill #0

## 2019-07-18 NOTE — Progress Notes (Signed)
Subjective:   [name] presents for continued care of multiple digital wounds sustained from blister formation due to wearing ill fitting shoes on a 5-mile walk.   He states his daughter has been helping him with dressing changes. He has a few Augmentin left. He states he has adequate Silvadene Cream as well. He is concerned about dark discoloration on the tips of a couple of his toes. Patient denies any pus or blood from digits. He also denies fever, chills, nightsweats, nausea or vomiting.  He states he has started juicing and changing his diet. He really wants to improve his health.  Current Outpatient Medications on File Prior to Visit  Medication Sig Dispense Refill  . amoxicillin-clavulanate (AUGMENTIN) 500-125 MG tablet Take 1 tablet (500 mg total) by mouth 3 (three) times daily for 10 days. (Patient not taking: Reported on 07/18/2019) 30 tablet 0  . Blood Glucose Monitoring Suppl (ONETOUCH VERIO) w/Device KIT 1 each by Does not apply route 2 (two) times daily. Use as instructed twice daily, check fasting sugar in morning and check before bedtime. E11.8 1 kit 0  . Continuous Blood Gluc Receiver (FREESTYLE LIBRE 14 DAY READER) DEVI 1 each by Does not apply route See admin instructions. Use as directed to check blood glucose 1 Device 0  . Continuous Blood Gluc Sensor (FREESTYLE LIBRE 14 DAY SENSOR) MISC INJECT 1 EACH INTO THE SKIN EVERY 14 (FOURTEEN) DAYS. E11.69 2 each 2  . glucose blood test strip Use as instructed twice daily, check fasting sugar in morning and check before bedtime. E11.8 100 each 12  . ONETOUCH DELICA LANCETS 56D MISC Use as instructed twice daily, check fasting sugar in morning and check before bedtime. E11.8 100 each 12  . silver sulfADIAZINE (SILVADENE) 1 % cream Apply to affected toes once daily and cover with dressing. 400 g 0  . Vitamin D, Ergocalciferol, (DRISDOL) 50000 units CAPS capsule Take 1 capsule (50,000 Units total) by mouth every 7 (seven) days.  (Patient not taking: Reported on 02/20/2019) 12 capsule 0   No current facility-administered medications on file prior to visit.     No Known Allergies   Objective:   [name] is a pleasant 69 y.o. AAM, WD, WN in NAD. AAO x 3. Vitals:   07/16/19 1420  Temp: (!) 96.9 F (36.1 C)       Vascular Examination: Capillary fill time to digits <3 seconds b/l. Palpable DP pulses b/l. Palpable PT pulses b/l. Pedal hair sparse b/l. Skin temperature gradient within normal limits b/l. Warmth and erythema of left forefoot has resolved. No edema noted b/l. No gangrene appreciated.   Dermatological Examination: No interdigital macerations bilaterally. Digits healing b/l. There are several patches of eschar noted distal tips digits 2, 3, 4 left foot and 2nd tip of 2nd digit right foot. No purulence, no bleeding, no drainage, no odor. All affected digits remain viable.          Musculoskeletal Examination: Normal muscle strength 5/5 to all lower extremity muscle groups bilaterally. No pain crepitus or joint limitation noted with ROM b/l. Hallux valgus with bunion deformity noted b/l. Patient ambulates independent of any assistive aids.  Neurological Examination: Protective sensation intact 5/5 intact bilaterally with 10g monofilament b/l. Protective sensation diminished with 10g monofilament b/l. Vibratory sensation diminished b/l.  Assessment:   1. Open wound of toes b/l feet, healing 2. NIDDM with neuropathy  Plan: 1. His wounds are healing. Explained this may take longer due to his diabetes  and neuropathy.  2. He is to continue Augmentin until all are gone. 3. Digits cleansed with wound cleanser. Silvadene Cream applied to all digits followed by light dressing. 4. Continue use of surgical shoe on left foot daily. 5. He is to continue Silvadene Cream dressing changes daily and was instructed to call immediately if any signs or symptoms of infection arise. His daughter is a physician and  she changes his dressings. 6. We did discuss obtaining ABIs/arterial USD for him and we will get those ordered. 7. Patient is to follow up one week for re-evaluation of wounds b/l feet. 8. Patient instructed to report to emergency department with worsening appearance of ulcer/toe/foot, increased pain, foul odor, increased redness, swelling, drainage, fever, chills, nightsweats, nausea, vomiting, increased blood sugar.  9. Patient/POA related understanding.  Return in about 1 week (around 07/23/2019) for wounds on multiple toes.

## 2019-07-18 NOTE — Telephone Encounter (Signed)
-----   Message from Marzetta Board, DPM sent at 07/18/2019 10:45 AM EDT ----- Please order Arterial dopplers and ABI's. Diagnosis: wounds of toes b/l feet. NIDDM with neuropathy. He has seen Dr. Minus Breeding at [facility] in the past for Cardiology. Thanks!

## 2019-07-18 NOTE — Progress Notes (Signed)
Went on a walk with the wrong shoes and he had to get 6 toenail removed.

## 2019-07-18 NOTE — Progress Notes (Signed)
Virtual Visit via Telephone Note  I connected with Caleb [name], on 07/18/2019 at 10:03 AM by telephone due to the COVID-19 pandemic and verified that I am speaking with the correct person using two identifiers.   Consent: I discussed the limitations, risks, security and privacy concerns of performing an evaluation and management service by telephone and the availability of in person appointments. I also discussed with the patient that there may be a patient responsible charge related to this service. The patient expressed understanding and agreed to proceed.   Location of Patient: Home  Location of Provider: Clinic   Persons participating in Telemedicine visit: Caleb [name] Dr. Margarita [name]     History of Present Illness: Caleb [name] a 69 year old male with a history of type 2 diabetes mellitus (A1c7.6), hypertension, hyperlipidemia who presents today for a follow-up visit. He recently had to see Podiatry and had to have 6 toenails removed due to damage to his toenails which he attributes to wearing the wrong pair of shoes; he was placed on Augmentin.   He does have neuropathy in his hands and Gabapentin has been effective. He stays active - swims 4x/week. Fasting sugars are 112-180; random sugars go up to 245 and he has been going up to 35 units bid. He admits to snacking on dark chocolates.  At his last visit he had mentioned some episodes of hypoglycemia for which he has been advised to down titrate his Humulin treatment today revealing 70/30 as needed by 2 units. Not taking Crestor even though it appears on his med list. He endorses compliance with his statins. He has no additional concerns today.  Past Medical History:  Diagnosis Date  . Diabetes mellitus without complication ([place])   . Dyslipidemia 09/27/2012  . Erectile dysfunction 09/27/2012  . Heart murmur   . Hypercholesteremia   . Hyperlipidemia 08/12/2012  . Hypertension   . Hyponatremia  08/14/2012  . Neuropathy   . Pancreatitis   . Pancreatitis, acute 09/27/2012  . Vitamin D deficiency 06/23/2015   No Known Allergies  Current Outpatient Medications on File Prior to Visit  Medication Sig Dispense Refill  . Blood Glucose Monitoring Suppl (ONETOUCH VERIO) w/Device KIT 1 each by Does not apply route 2 (two) times daily. Use as instructed twice daily, check fasting sugar in morning and check before bedtime. E11.8 1 kit 0  . Continuous Blood Gluc Receiver (FREESTYLE LIBRE 14 DAY READER) DEVI 1 each by Does not apply route See admin instructions. Use as directed to check blood glucose 1 Device 0  . Continuous Blood Gluc Sensor (FREESTYLE LIBRE 14 DAY SENSOR) MISC INJECT 1 EACH INTO THE SKIN EVERY 14 (FOURTEEN) DAYS. E11.69 2 each 2  . gabapentin (NEURONTIN) 300 MG capsule Take 1 capsule (300 mg total) by mouth 2 (two) times daily. 180 capsule 1  . glucose blood test strip Use as instructed twice daily, check fasting sugar in morning and check before bedtime. E11.8 100 each 12  . HUMULIN 70/30 (70-30) 100 UNIT/ML injection INJECT 32 UNITS INTO THE SKIN 2 TIMES DAILY WITH A MEAL. 60 mL 3  . lisinopril-hydrochlorothiazide (ZESTORETIC) 20-12.5 MG tablet Take 2 tablets by mouth daily. 180 tablet 1  . metFORMIN (GLUCOPHAGE) 1000 MG tablet Take 1 tablet (1,000 mg total) by mouth 2 (two) times daily with a meal. 360 tablet 1  . metoprolol tartrate (LOPRESSOR) 100 MG tablet Take 1 tablet (100 mg total) by mouth 2 (two) times daily. 180 tablet 1  . ONETOUCH DELICA LANCETS  33G MISC Use as instructed twice daily, check fasting sugar in morning and check before bedtime. E11.8 100 each 12  . sildenafil (VIAGRA) 50 MG tablet TAKE 2 TABLETS BY MOUTH DAILY AS NEEDED FOR ERECTILE DYSFUNCTION 20 tablet 1  . silver sulfADIAZINE (SILVADENE) 1 % cream Apply to affected toes once daily and cover with dressing. 400 g 0  . amoxicillin-clavulanate (AUGMENTIN) 500-125 MG tablet Take 1 tablet (500 mg total) by  mouth 3 (three) times daily for 10 days. (Patient not taking: Reported on 07/18/2019) 30 tablet 0  . rosuvastatin (CRESTOR) 40 MG tablet Take 1 tablet (40 mg total) by mouth daily. To lower cholesterol (Patient not taking: Reported on 02/20/2019) 90 tablet 1  . Vitamin D, Ergocalciferol, (DRISDOL) 50000 units CAPS capsule Take 1 capsule (50,000 Units total) by mouth every 7 (seven) days. (Patient not taking: Reported on 02/20/2019) 12 capsule 0   No current facility-administered medications on file prior to visit.    Observations/Objective: Awake, alert, oriented x3 Not in acute distress  Lab Results  Component Value Date   HGBA1C 7.6 (A) 01/16/2019    Assessment and Plan: 1. Type 2 diabetes mellitus with other specified complication, with long-term current use of insulin (HCC) Suboptimal control with A1c of 7.6; goal is less than 7.0 Intermittent intake of chocolates could be contributing Advised to adhere to a strict diabetic diet Counseled on Diabetic diet, my plate method, 010 minutes of moderate intensity exercise/week Blood sugar logs with fasting goals of 80-120 mg/dl, random of less than 180 and in the event of sugars less than 60 mg/dl or greater than 400 mg/dl encouraged to notify the clinic. Advised on the need for annual eye exams, annual foot exams, Pneumonia vaccine. - gabapentin (NEURONTIN) 300 MG capsule; Take 1 capsule (300 mg total) by mouth 2 (two) times daily.  Dispense: 180 capsule; Refill: 1 - metFORMIN (GLUCOPHAGE) 1000 MG tablet; Take 1 tablet (1,000 mg total) by mouth 2 (two) times daily with a meal.  Dispense: 360 tablet; Refill: 1  2. Type 2 diabetes mellitus with hypoglycemia without coma, with long-term current use of insulin ([place]) He previously had hypoglycemia so we will need to be cautious with regards to titrating his Humulin 70/30 - HUMULIN 70/30 (70-30) 100 UNIT/ML injection; INJECT 37 UNITS INTO THE SKIN 2 TIMES DAILY WITH A MEAL.  Dispense: 60 mL;  Refill: 6  3. Essential hypertension Controlled Counseled on blood pressure goal of less than 130/80, low-sodium, DASH diet, medication compliance, 150 minutes of moderate intensity exercise per week. Discussed medication compliance, adverse effects. - lisinopril-hydrochlorothiazide (ZESTORETIC) 20-12.5 MG tablet; Take 2 tablets by mouth daily.  Dispense: 180 tablet; Refill: 1 - metoprolol tartrate (LOPRESSOR) 100 MG tablet; Take 1 tablet (100 mg total) by mouth 2 (two) times daily.  Dispense: 180 tablet; Refill: 1  4. Pure hypercholesterolemia Uncontrolled He has not been taking Crestor and has been advised to resume these - rosuvastatin (CRESTOR) 40 MG tablet; Take 1 tablet (40 mg total) by mouth daily. To lower cholesterol  Dispense: 90 tablet; Refill: 1   Follow Up Instructions: 3 months-in person   I discussed the assessment and treatment plan with the patient. The patient was provided an opportunity to ask questions and all were answered. The patient agreed with the plan and demonstrated an understanding of the instructions.   The patient was advised to call back or seek an in-person evaluation if the symptoms worsen or if the condition fails to improve as anticipated.  I provided 16 minutes total of non-face-to-face time during this encounter including median intraservice time, reviewing previous notes, investigations, ordering medications, medical decision making, coordinating care and patient verbalized understanding at the end of the visit.     [name], MD, FAAFP. [facility] and [place], [place]   07/18/2019, 10:03 AM

## 2019-07-18 NOTE — Telephone Encounter (Signed)
Orders faxed to [facility]. 

## 2019-07-19 ENCOUNTER — Encounter: Payer: Self-pay | Admitting: Family Medicine

## 2019-07-23 ENCOUNTER — Ambulatory Visit (INDEPENDENT_AMBULATORY_CARE_PROVIDER_SITE_OTHER): Payer: Medicare HMO | Admitting: Podiatry

## 2019-07-23 ENCOUNTER — Ambulatory Visit (HOSPITAL_COMMUNITY)
Admission: RE | Admit: 2019-07-23 | Discharge: 2019-07-23 | Disposition: A | Discharge status: Home / Self Care | Payer: Medicare HMO | Source: Ambulatory Visit | Attending: Cardiovascular Disease | Admitting: Cardiovascular Disease

## 2019-07-23 ENCOUNTER — Other Ambulatory Visit: Payer: Self-pay

## 2019-07-23 ENCOUNTER — Encounter: Payer: Self-pay | Admitting: Podiatry

## 2019-07-23 VITALS — Temp 97.3°F

## 2019-07-23 DIAGNOSIS — S90822A Blister (nonthermal), left foot, initial encounter: Secondary | ICD-10-CM | POA: Diagnosis not present

## 2019-07-23 DIAGNOSIS — I739 Peripheral vascular disease, unspecified: Secondary | ICD-10-CM

## 2019-07-23 DIAGNOSIS — E1142 Type 2 diabetes mellitus with diabetic polyneuropathy: Secondary | ICD-10-CM

## 2019-07-23 DIAGNOSIS — L089 Local infection of the skin and subcutaneous tissue, unspecified: Secondary | ICD-10-CM | POA: Diagnosis not present

## 2019-07-23 DIAGNOSIS — S90829A Blister (nonthermal), unspecified foot, initial encounter: Secondary | ICD-10-CM | POA: Insufficient documentation

## 2019-07-23 DIAGNOSIS — S91109D Unspecified open wound of unspecified toe(s) without damage to nail, subsequent encounter: Secondary | ICD-10-CM

## 2019-07-23 MED ORDER — AMOXICILLIN-POT CLAVULANATE 500-125 MG PO TABS: 1.0000 | ORAL_TABLET | Freq: Three times a day (TID) | ORAL | 0 refills | Status: AC

## 2019-07-23 MED FILL — AMOX-CLAV 500-125 MG TABLET: 500-125 | 7 days supply | Qty: 21 | Fill #0

## 2019-07-24 NOTE — Progress Notes (Signed)
Subjective:   [name] presents for continued care of multiple lower extremity digital wounds sustained while on a 5 mile walk wearing ill fitting shoes. He has completed Augmentin. He has had noninvasive arterial studies performed this morning and was told he had blockages that would need to be addressed. He has been scheduled with Dr. Gwenlyn Found on 08/02/2019. Pt. denies any new complaints.  Patient denies any fever, chills, nightsweats, nausea or vomiting.  Current Outpatient Medications on File Prior to Visit  Medication Sig Dispense Refill  . Blood Glucose Monitoring Suppl (ONETOUCH VERIO) w/Device KIT 1 each by Does not apply route 2 (two) times daily. Use as instructed twice daily, check fasting sugar in morning and check before bedtime. E11.8 1 kit 0  . Continuous Blood Gluc Receiver (FREESTYLE LIBRE 14 DAY READER) DEVI 1 each by Does not apply route See admin instructions. Use as directed to check blood glucose 1 Device 0  . Continuous Blood Gluc Sensor (FREESTYLE LIBRE 14 DAY SENSOR) MISC INJECT 1 EACH INTO THE SKIN EVERY 14 (FOURTEEN) DAYS. E11.69 2 each 2  . gabapentin (NEURONTIN) 300 MG capsule Take 1 capsule (300 mg total) by mouth 2 (two) times daily. 180 capsule 1  . glucose blood test strip Use as instructed twice daily, check fasting sugar in morning and check before bedtime. E11.8 100 each 12  . HUMULIN 70/30 (70-30) 100 UNIT/ML injection INJECT 37 UNITS INTO THE SKIN 2 TIMES DAILY WITH A MEAL. 60 mL 6  . lisinopril-hydrochlorothiazide (ZESTORETIC) 20-12.5 MG tablet Take 2 tablets by mouth daily. 180 tablet 1  . metFORMIN (GLUCOPHAGE) 1000 MG tablet Take 1 tablet (1,000 mg total) by mouth 2 (two) times daily with a meal. 360 tablet 1  . metoprolol tartrate (LOPRESSOR) 100 MG tablet Take 1 tablet (100 mg total) by mouth 2 (two) times daily. 180 tablet 1  . ONETOUCH DELICA LANCETS 26V MISC Use as instructed twice daily, check fasting sugar in morning and check before bedtime. E11.8  100 each 12  . rosuvastatin (CRESTOR) 40 MG tablet Take 1 tablet (40 mg total) by mouth daily. To lower cholesterol 90 tablet 1  . sildenafil (VIAGRA) 50 MG tablet TAKE 2 TABLETS BY MOUTH DAILY AS NEEDED FOR ERECTILE DYSFUNCTION 20 tablet 1  . silver sulfADIAZINE (SILVADENE) 1 % cream Apply to affected toes once daily and cover with dressing. 400 g 0  . Vitamin D, Ergocalciferol, (DRISDOL) 50000 units CAPS capsule Take 1 capsule (50,000 Units total) by mouth every 7 (seven) days. (Patient not taking: Reported on 02/20/2019) 12 capsule 0   No current facility-administered medications on file prior to visit.     No Known Allergies   Objective:   Vitals:   07/23/19 1308  Temp: (!) 97.3 F (36.3 C)     Neurovascular status unchanged.  Dermatological Examination:           Wounds are healing slowly. Fibrotic tissue noted distal tips left 2nd, 3rd and 4th digits.  All digits stable. No gangrene, no ischemia. No erythema, no edema, no drainage, no flocculence. No odor.  Musculoskeletal Examination: Normal muscle strength 5/5 to all lower extremity muscle groups bilaterally. No pain crepitus or joint limitation noted with ROM b/l. Hallux valgus with bunion deformity noted b/l. Hammertoes noted to the 2-5 bilaterally.  Neurological Examination: Protective sensation diminished with 10g monofilament b/l. Vibratory sensation diminished b/l.   Lower Extremity Vascular Studies 07/23/2019  LOWER EXTREMITY DOPPLER STUDY   Indications: Ulceration.   High  Risk     Hypertension, hyperlipidemia, Diabetes, past history of  Factors:     smoking.   Other Factors: He recently had to see Podiatry and had to have 6 toenails         removed due to damage to his toenails which he attributes  to         wearing the wrong pair of shoes; he was placed on  Augmentin. He         stays active - swims and walks 4x/week. He denies any         claudication  symptoms.  Performing Technologist: Wilkie Aye RVT     Examination Guidelines: A complete evaluation includes at minimum, Doppler  waveform signals and systolic blood pressure reading at the level of  bilateral  brachial, anterior tibial, and posterior tibial arteries, when vessel  segments  are accessible. Bilateral testing is considered an integral part of a  complete  examination. Photoelectric Plethysmograph (PPG) waveforms and toe systolic  pressure readings are included as required and additional duplex testing  as  needed. Limited examinations for reoccurring indications may be performed  as  noted.     ABI Findings:  +---------+------------------+-----+----------+--------+  Right  Rt Pressure (mmHg)IndexWaveform Comment   +---------+------------------+-----+----------+--------+  Brachial 174                      +---------+------------------+-----+----------+--------+  CFA               triphasic       +---------+------------------+-----+----------+--------+  Popliteal            monophasic      +---------+------------------+-----+----------+--------+  ATA   126        0.72 monophasic      +---------+------------------+-----+----------+--------+  PTA   133        0.76 monophasic      +---------+------------------+-----+----------+--------+  PERO   120        0.69 monophasic      +---------+------------------+-----+----------+--------+  Great Toe160        0.91 Abnormal       +---------+------------------+-----+----------+--------+   +---------+------------------+-----+----------+-------+  Left   Lt Pressure (mmHg)IndexWaveform Comment  +---------+------------------+-----+----------+-------+  Brachial 175                        +---------+------------------+-----+----------+-------+  CFA               triphasic       +---------+------------------+-----+----------+-------+  Popliteal            monophasic      +---------+------------------+-----+----------+-------+  ATA   124        0.71 monophasic      +---------+------------------+-----+----------+-------+  PTA   116        0.66 monophasic      +---------+------------------+-----+----------+-------+  PERO   129        0.74 monophasic      +---------+------------------+-----+----------+-------+  Great Toe152        0.87 Abnormal       +---------+------------------+-----+----------+-------+   +-------+-----------+-----------+  ABI/TBIToday's ABIToday's TBI  +-------+-----------+-----------+  Right 0.76    0.91      +-------+-----------+-----------+  Left  0.74    0.87      +-------+-----------+-----------+   TOES Findings:  +----------+---------------+--------+-------+  Right ToesPressure (mmHg)WaveformComment  +----------+---------------+--------+-------+  1st Digit         Abnormal      +----------+---------------+--------+-------+  2nd Digit  Abnormal      +----------+---------------+--------+-------+  3rd Digit         Abnormal      +----------+---------------+--------+-------+  4th Digit         Abnormal      +----------+---------------+--------+-------+  5th Digit         Abnormal      +----------+---------------+--------+-------+   +---------+---------------+--------+-------+  Left ToesPressure (mmHg)WaveformComment  +---------+---------------+--------+-------+  1st Digit        Abnormal      +---------+---------------+--------+-------+  2nd Digit        Abnormal       +---------+---------------+--------+-------+  3rd Digit        Abnormal      +---------+---------------+--------+-------+  4th Digit        Abnormal      +---------+---------------+--------+-------+  5th Digit        Abnormal      +---------+---------------+--------+-------+   Appointment with Dr. Gwenlyn Found 08/02/19.    Summary:  Right: Resting right ankle-brachial index indicates moderate right lower  extremity arterial disease. The right toe-brachial index is normal. PPG  tracings appear dampened.   Left: Resting left ankle-brachial index indicates moderate left lower  extremity arterial disease. The left toe-brachial index is abnormal. PPG  tracings appear dampened.    *See table(s) above for measurements and observations.     Vascular consult recommended.  Electronically signed by Larae Grooms MD on 07/24/2019 at 11:55:48 AM.   Vascular US Lower Extremity Arterial Segmental Study Summary: Right: Atherosclerosis in the femoral, popliteal and tibial arteries. 75-99% stenosis in the distal SFA/AK popliteal artery. 50-74% stenosis in the prox/mid popliteal artery. The proximal posterior tibial artery appears to be occluded with reconstitution of flow distally. Left: Atherosclerosis in the femoral, popliteal and tibial arteries. The mid to distal popliteal artery appears to be occluded. The proximal posterior tibial artery appears to be occluded with reconstitution of flow distally. Appointment with Dr. Gwenlyn Found 08/02/19.  Assessment:   Open wound of multiple toes b/l hallux, b/l 2nd, left 3rd and left 4th digits NIDDM with neuropathy  Plan: 1. Reviewed vascular study results. Right lower extremity, he is found to have 75-99% SFA stenosis in distal SFA/AK popliteal artery, 50-74% stenosis in prox/mid popliteal artery, proximal posterior tibial artery occlusion with reconstitution of flow distally. Left lower extremity, he has  atherosclerosis of femoral, popliteal and tibial arteries, occlusion of mid to distal popliteal artery, and PT artery occlusion with reconstitution of flow distally. He is scheduled with Dr. Gwenlyn Found on 08/02/2019.  2. Continue daily dressing changes with Silvadene Cream. His daughter is a physician and assists him with this. May introduce Santyl for fibrotic tissue on next visit. 3. I will refill his Augmentin 500/125 mg. 4. Patient is to follow up with me in one week. 5. Patient instructed to report to emergency department with worsening appearance of ulcer/toe/foot, increased pain, foul odor, increased redness, swelling, drainage, fever, chills, nightsweats, nausea, vomiting, increased blood sugar.  6. Patient/POA related understanding.  Return in about 1 week (around 07/30/2019) for blisters/diabetic ulcer.

## 2019-07-29 ENCOUNTER — Other Ambulatory Visit: Payer: Self-pay

## 2019-07-29 ENCOUNTER — Ambulatory Visit (INDEPENDENT_AMBULATORY_CARE_PROVIDER_SITE_OTHER): Payer: Medicare HMO | Admitting: Podiatry

## 2019-07-29 VITALS — Temp 96.5°F

## 2019-07-29 DIAGNOSIS — L97522 Non-pressure chronic ulcer of other part of left foot with fat layer exposed: Secondary | ICD-10-CM

## 2019-07-29 DIAGNOSIS — E1142 Type 2 diabetes mellitus with diabetic polyneuropathy: Secondary | ICD-10-CM | POA: Diagnosis not present

## 2019-07-29 DIAGNOSIS — I739 Peripheral vascular disease, unspecified: Secondary | ICD-10-CM

## 2019-07-29 DIAGNOSIS — S91109D Unspecified open wound of unspecified toe(s) without damage to nail, subsequent encounter: Secondary | ICD-10-CM | POA: Diagnosis not present

## 2019-07-29 DIAGNOSIS — E11621 Type 2 diabetes mellitus with foot ulcer: Secondary | ICD-10-CM | POA: Diagnosis not present

## 2019-07-29 MED ORDER — COLLAGENASE 250 UNIT/GM EX OINT: 1.0000 "application " | TOPICAL_OINTMENT | Freq: Every day | CUTANEOUS | 0 refills | Status: DC

## 2019-07-29 NOTE — Patient Instructions (Signed)
Diabetes Mellitus and Foot Care Foot care is an important part of your health, especially when you have diabetes. Diabetes may cause you to have problems because of poor blood flow (circulation) to your feet and legs, which can cause your skin to:  Become thinner and drier.  Break more easily.  Heal more slowly.  Peel and crack. You may also have nerve damage (neuropathy) in your legs and feet, causing decreased feeling in them. This means that you may not notice minor injuries to your feet that could lead to more serious problems. Noticing and addressing any potential problems early is the best way to prevent future foot problems. How to care for your feet Foot hygiene  Wash your feet daily with warm water and mild soap. Do not use hot water. Then, pat your feet and the areas between your toes until they are completely dry. Do not soak your feet as this can dry your skin.  Trim your toenails straight across. Do not dig under them or around the cuticle. File the edges of your nails with an emery board or nail file.  Apply a moisturizing lotion or petroleum jelly to the skin on your feet and to dry, brittle toenails. Use lotion that does not contain alcohol and is unscented. Do not apply lotion between your toes. Shoes and socks  Wear clean socks or stockings every day. Make sure they are not too tight. Do not wear knee-high stockings since they may decrease blood flow to your legs.  Wear shoes that fit properly and have enough cushioning. Always look in your shoes before you put them on to be sure there are no objects inside.  To break in new shoes, wear them for just a few hours a day. This prevents injuries on your feet. Wounds, scrapes, corns, and calluses  Check your feet daily for blisters, cuts, bruises, sores, and redness. If you cannot see the bottom of your feet, use a mirror or ask someone for help.  Do not cut corns or calluses or try to remove them with medicine.  If you  find a minor scrape, cut, or break in the skin on your feet, keep it and the skin around it clean and dry. You may clean these areas with mild soap and water. Do not clean the area with peroxide, alcohol, or iodine.  If you have a wound, scrape, corn, or callus on your foot, look at it several times a day to make sure it is healing and not infected. Check for: ? Redness, swelling, or pain. ? Fluid or blood. ? Warmth. ? Pus or a bad smell. General instructions  Do not cross your legs. This may decrease blood flow to your feet.  Do not use heating pads or hot water bottles on your feet. They may burn your skin. If you have lost feeling in your feet or legs, you may not know this is happening until it is too late.  Protect your feet from hot and cold by wearing shoes, such as at the beach or on hot pavement.  Schedule a complete foot exam at least once a year (annually) or more often if you have foot problems. If you have foot problems, report any cuts, sores, or bruises to your health care provider immediately. Contact a health care provider if:  You have a medical condition that increases your risk of infection and you have any cuts, sores, or bruises on your feet.  You have an injury that is not   healing.  You have redness on your legs or feet.  You feel burning or tingling in your legs or feet.  You have pain or cramps in your legs and feet.  Your legs or feet are numb.  Your feet always feel cold.  You have pain around a toenail. Get help right away if:  You have a wound, scrape, corn, or callus on your foot and: ? You have pain, swelling, or redness that gets worse. ? You have fluid or blood coming from the wound, scrape, corn, or callus. ? Your wound, scrape, corn, or callus feels warm to the touch. ? You have pus or a bad smell coming from the wound, scrape, corn, or callus. ? You have a fever. ? You have a red line going up your leg. Summary  Check your feet every day  for cuts, sores, red spots, swelling, and blisters.  Moisturize feet and legs daily.  Wear shoes that fit properly and have enough cushioning.  If you have foot problems, report any cuts, sores, or bruises to your health care provider immediately.  Schedule a complete foot exam at least once a year (annually) or more often if you have foot problems. This information is not intended to replace advice given to you by your health care provider. Make sure you discuss any questions you have with your health care provider. Document Revised: 11/28/2018 Document Reviewed: 04/08/2016 Elsevier Patient Education  2020 [employer]  

## 2019-07-30 NOTE — Progress Notes (Signed)
Subjective:   [name] presents for continued care of ulceration of bilateral feet. Patient's daughter has been performing daily dressing changes to both feet daily utilizing Silvadene Cream. Pt. denies any new complaints.  Patient denies any fever, chills, nightsweats, nausea or vomiting. He is scheduled to see Dr. Lorie Phenix this Friday for Vascular assessment.  Current Outpatient Medications on File Prior to Visit  Medication Sig Dispense Refill  . Blood Glucose Monitoring Suppl (ONETOUCH VERIO) w/Device KIT 1 each by Does not apply route 2 (two) times daily. Use as instructed twice daily, check fasting sugar in morning and check before bedtime. E11.8 1 kit 0  . Continuous Blood Gluc Receiver (FREESTYLE LIBRE 14 DAY READER) DEVI 1 each by Does not apply route See admin instructions. Use as directed to check blood glucose 1 Device 0  . Continuous Blood Gluc Sensor (FREESTYLE LIBRE 14 DAY SENSOR) MISC INJECT 1 EACH INTO THE SKIN EVERY 14 (FOURTEEN) DAYS. E11.69 2 each 2  . gabapentin (NEURONTIN) 300 MG capsule Take 1 capsule (300 mg total) by mouth 2 (two) times daily. 180 capsule 1  . glucose blood test strip Use as instructed twice daily, check fasting sugar in morning and check before bedtime. E11.8 100 each 12  . HUMULIN 70/30 (70-30) 100 UNIT/ML injection INJECT 37 UNITS INTO THE SKIN 2 TIMES DAILY WITH A MEAL. 60 mL 6  . lisinopril-hydrochlorothiazide (ZESTORETIC) 20-12.5 MG tablet Take 2 tablets by mouth daily. 180 tablet 1  . metFORMIN (GLUCOPHAGE) 1000 MG tablet Take 1 tablet (1,000 mg total) by mouth 2 (two) times daily with a meal. 360 tablet 1  . metoprolol tartrate (LOPRESSOR) 100 MG tablet Take 1 tablet (100 mg total) by mouth 2 (two) times daily. 180 tablet 1  . ONETOUCH DELICA LANCETS 72B MISC Use as instructed twice daily, check fasting sugar in morning and check before bedtime. E11.8 100 each 12  . rosuvastatin (CRESTOR) 40 MG tablet Take 1 tablet (40 mg total) by  mouth daily. To lower cholesterol 90 tablet 1  . sildenafil (VIAGRA) 50 MG tablet TAKE 2 TABLETS BY MOUTH DAILY AS NEEDED FOR ERECTILE DYSFUNCTION 20 tablet 1  . silver sulfADIAZINE (SILVADENE) 1 % cream Apply to affected toes once daily and cover with dressing. 400 g 0  . Vitamin D, Ergocalciferol, (DRISDOL) 50000 units CAPS capsule Take 1 capsule (50,000 Units total) by mouth every 7 (seven) days. 12 capsule 0   No current facility-administered medications on file prior to visit.     No Known Allergies   Objective:   Mr. Erhart is a pleasant 69 y.o. AAM, WD, WN in NAD. AAO x 3.   Vitals:   07/29/19 1315  Temp: (!) 96.5 F (35.8 C)    Vascular Examination: Capillary refill time to digits <4 seconds b/l. Palpable DP pulses b/l. Palpable PT pulses b/l. Pedal hair absent b/l Skin temperature gradient within normal limits b/l.  Dermatological Examination: Pedal skin with normal turgor, texture and tone bilaterally. No interdigital macerations bilaterally.              Ulceration #1: Left great toe:  Measurements carried out today of 1.5 x 3.0 x 0.2 cm to level of subcutaneous tissue.  No periulcerative erythema, no edema, no drainage.  No flocculence, no malodor. Base of ulcer is fibrogranular. There is no undermining, no tunneling, no visible joint or bone exposure, no probing to bone, no odor. Ulcer not debrided on today. No signs of infection noted.  Ulceration #  2: Left 2nd toe Measurements carried out today of 1.0 x 1.0 x 0.2 cm to level of subcutaneous tissue. No periulcerative erythema, no edema, no drainage.  No flocculence, no malodor. There is no undermining, no tunneling, no visible joint or bone exposure, no probing to bone, no odor. Base of ulcer is noted to be fibrogranular. Ulcer not debrided on today. No signs of infection noted.  Ulceration #3: Left 3rd digit Predebridement measurements carried out today of 1.0 x 0.6 x 0.2 cm.  No periulcerative erythema, no  edema, no drainage.  No flocculence, no malodor. Hyperkeratotic rim. Postdebridement measurements are 1.8 x 1.0 x 0.2 cm to level of subcutaneous tissue. Postdebridement, there is no undermining, no tunneling, no visible joint or bone exposure, no probing to bone, no odor. Base of ulcer center is noted to be granular with eschar roof. Good bleeding with debridement to level of subcutaneous tissue. Blood loss < 1cc. No signs of infection noted.  Ulceration #4: Left 4th digit Predebridement measurements carried out today of 2.0 x 1.5 x 0.2 cm.  No periulcerative erythema, no edema, no drainage.  No flocculence, no malodor.  Hyperkeratotic rim. Postdebridement measurements are 3.5 x 2.0 x 0.2 cm to level of subcutaneous tissue. Postdebridement, there is no undermining, no tunneling, no visible joint or bone exposure, no probing to bone, no odor. Base of ulcer is granular with eschar roof. Good bleeding with debridement to level of subcutaneous tissue. Blood loss <1 cc. No signs of infection noted.  Ulceration located #5: Right great toe Measurements carried out today are 1.0 x 1.0 x 0.2 c,  No periulcerative erythema, no edema, no drainage.  No flocculence, no malodor.  Hyperkeratotic rim. There is no undermining, no tunneling, no visible joint or bone exposure, no probing to bone, no odor. Base of ulcer is healthy, red and granular. No signs of infection noted.  Ulceration located right 2nd digit: Measurement carried out today are 1.0 x 0.3 x 0.1 cm.  No periulcerative erythema, no edema, no drainage.  No flocculence, no malodor.  Hyperkeratotic rim. Postdebridement of hyperkeratotic tissue to level of epidermis are the same. Postdebridement, there is no undermining, no tunneling, no visible joint or bone exposure, no probing to bone, no odor. Base of ulcer is healthy, red and granular. No signs of infection noted.  Ulceration located right 3rd digit: Digit is completely epithelialized. No erythema, no  edema, no drainage. Digit has resolved. No signs of infection noted.  Musculoskeletal Examination: Normal muscle strength 5/5 to all lower extremity muscle groups bilaterally. No pain crepitus or joint limitation noted with ROM b/l. Hallux valgus with bunion deformity noted b/l. Hammertoes noted to the 2-5 bilaterally. Patient ambulates independent of any assistive aids.  Neurological Examination: Protective sensation diminished with 10g monofilament b/l. Vibratory sensation diminished b/l. Proprioception intact bilaterally.  Assessment:   1.  Diabetic Ulceration b/l hallux, b/l 2nd digits, left 3rd, left 4th digit, all healing 2.  Healed diabetic ulceration right 3rd digit 3.  PAD 4.  NIDDM with peripheral neuropathy  Plan: 1. Mr. Korn responded well to all treatments today and to current treatment plan overall. All questions answered.  2. Right 3rd digit ulcer is completely healed. 3. Remaining wounds are responding favorably to treatment.  4. We will continue weekly wound care appointments until all ulcers are healed. 5. Ulcer b/l hallux, b/l 2nd digits were cleansed with wound cleanser. Silvadene Cream was applied to base of ulcer.   6. Left 3rd digit and  left 4th digit were debrided of devitalized hyperkeratotic rim to the level of healthy, viable subcutaneous tissue with sterile scalpel blade. Ulcers cleansed with wound cleanser. Silvadene Cream was applied to base of wound. Minimal blood loss as indicated above <1 cc. 7. Left 4th digit was debrided of devitalized hyperkeratotic rim to level of health, viable subcutaneous tissue with sterile scalpel blade. Ulcer was cleansed with wound cleanser. 8. Mr. Santillo tolerated ulcer debridements/treatment well without any complication.  9. No indications for Xray on today. 10. Mr. Osmond is to continue wearing surgical shoe on left foot daily. 11. Patient was given updated written instructions on dressing changes and was instructed to  call immediately if any signs or symptoms of infection arise.  12. Changing treatment plan for left 3rd and left 4th digits today. I am adding Santyl ointment which is to be added to left 3rd and 4th digit eschar only. Discontinue Silvadene Cream to these digits. Goal of Santyl Ointment is to chemically debride eschar/fibrotic tissue. Rx written for Santyl Ointment sent to pharmacy today.  13. For bilateral hallux, b/l 2nd digits we will continue Silvadene Cream daily.  14. Continue Augmentin until they are all gone. 15. Pending Vascular Consultation with Dr. Quay Burow, which is scheduled for this Friday, Aug 02, 2019. 16. Patient is to follow up with me in one week. 17. Patient instructed to report to emergency department with worsening appearance of ulcer/toe/foot, increased pain, foul odor, increased redness, swelling, drainage, fever, chills, nightsweats, nausea, vomiting, increased blood sugar.  25. Mr. Helmes expresses realistic understanding of his comorbidities which are a risk to his healing: uncontrolled diabetes, PAD, neuropathy 19. Patient/POA related understanding. 20. Patient risk factors inhibiting healing: uncontrolled diabetes, PAD, neuropathy, dyslipidemia.  Return in about 1 week (around 08/05/2019).

## 2019-07-31 ENCOUNTER — Encounter: Payer: Self-pay | Admitting: Podiatry

## 2019-08-02 ENCOUNTER — Ambulatory Visit (INDEPENDENT_AMBULATORY_CARE_PROVIDER_SITE_OTHER): Payer: Medicare HMO | Admitting: Cardiovascular Disease

## 2019-08-02 ENCOUNTER — Ambulatory Visit (HOSPITAL_COMMUNITY)
Admission: RE | Admit: 2019-08-02 | Discharge: 2019-08-02 | Disposition: A | Discharge status: Home / Self Care | Payer: Medicare HMO | Source: Ambulatory Visit | Attending: Internal Medicine | Admitting: Internal Medicine

## 2019-08-02 ENCOUNTER — Encounter: Payer: Self-pay | Admitting: Cardiovascular Disease

## 2019-08-02 ENCOUNTER — Other Ambulatory Visit (HOSPITAL_COMMUNITY): Payer: Self-pay | Admitting: Cardiovascular Disease

## 2019-08-02 ENCOUNTER — Other Ambulatory Visit: Payer: Self-pay

## 2019-08-02 VITALS — BP 134/82 | HR 96 | Temp 95.2°F | Ht 68.0 in | Wt 190.0 lb

## 2019-08-02 DIAGNOSIS — R0989 Other specified symptoms and signs involving the circulatory and respiratory systems: Secondary | ICD-10-CM | POA: Insufficient documentation

## 2019-08-02 DIAGNOSIS — R011 Cardiac murmur, unspecified: Secondary | ICD-10-CM

## 2019-08-02 DIAGNOSIS — I10 Essential (primary) hypertension: Secondary | ICD-10-CM

## 2019-08-02 DIAGNOSIS — Z959 Presence of cardiac and vascular implant and graft, unspecified: Secondary | ICD-10-CM | POA: Diagnosis not present

## 2019-08-02 DIAGNOSIS — I998 Other disorder of circulatory system: Secondary | ICD-10-CM

## 2019-08-02 DIAGNOSIS — E782 Mixed hyperlipidemia: Secondary | ICD-10-CM | POA: Diagnosis not present

## 2019-08-02 DIAGNOSIS — I739 Peripheral vascular disease, unspecified: Secondary | ICD-10-CM

## 2019-08-02 DIAGNOSIS — S91109D Unspecified open wound of unspecified toe(s) without damage to nail, subsequent encounter: Secondary | ICD-10-CM | POA: Diagnosis not present

## 2019-08-02 DIAGNOSIS — I70229 Atherosclerosis of native arteries of extremities with rest pain, unspecified extremity: Secondary | ICD-10-CM | POA: Insufficient documentation

## 2019-08-02 DIAGNOSIS — Z9889 Other specified postprocedural states: Secondary | ICD-10-CM

## 2019-08-02 LAB — BASIC METABOLIC PANEL
BUN/Creatinine Ratio: 19 (ref 10–24)
BUN: 25 mg/dL (ref 8–27)
CO2: 23 mmol/L (ref 20–29)
Calcium: 9.7 mg/dL (ref 8.6–10.2)
Chloride: 98 mmol/L (ref 96–106)
Creatinine, Ser: 1.35 mg/dL — ABNORMAL HIGH (ref 0.76–1.27)
GFR calc Af Amer: 61 mL/min/{1.73_m2} (ref >59)
GFR calc non Af Amer: 53 mL/min/{1.73_m2} — ABNORMAL LOW (ref >59)
Glucose: 138 mg/dL — ABNORMAL HIGH (ref 65–99)
Potassium: 5.7 mmol/L — ABNORMAL HIGH (ref 3.5–5.2)
Sodium: 136 mmol/L (ref 134–144)

## 2019-08-02 LAB — CBC
Hematocrit: 49.3 % (ref 37.5–51.0)
Hemoglobin: 16.1 g/dL (ref 13.0–17.7)
MCH: 28 pg (ref 26.6–33.0)
MCHC: 32.7 g/dL (ref 31.5–35.7)
MCV: 86 fL (ref 79–97)
Platelets: 318 10*3/uL (ref 150–450)
RBC: 5.76 x10E6/uL (ref 4.14–5.80)
RDW: 13.9 % (ref 11.6–15.4)
WBC: 6 10*3/uL (ref 3.4–10.8)

## 2019-08-02 MED ORDER — SODIUM CHLORIDE 0.9% FLUSH
3.0000 mL | Freq: Two times a day (BID) | INTRAVENOUS | Status: DC
Start: 2019-08-02 — End: 2022-06-01

## 2019-08-02 NOTE — Assessment & Plan Note (Signed)
Caleb [name] was referred to me by Dr. Felizardo Hoffmann, his podiatrist, for evaluation of critical limb ischemia.  He has developed wounds on his toes after walking 5 miles in [facility] with new shoes.  These are being aggressively treated as an outpatient by Dr. Adah Perl with slow but excellent results.  He does complain of lifestyle of any claudication and had Doppler studies performed in our office 07/23/2019 revealing ABIs in the mid 7 range bilaterally with a high-frequency signal in the right SFA, occluded left popliteal and tibial vessel disease.  He wishes to proceed with angiography potential intervention for treatment of critical ischemia and/or lifestyle limiting claudication.

## 2019-08-02 NOTE — Patient Instructions (Addendum)
    [place] [address] Dept: [phone] Loc: [place]  08/02/2019  You are scheduled for a Peripheral Angiogram on Thursday, May 20 with Dr. Quay Burow.  1. Please arrive at the [facility] (Main Entrance A) at [facility]: [address] [place], [place] [address] at 9:30 AM (This time is two hours before your procedure to ensure your preparation). Free valet parking service is available.   Special note: Every effort is made to have your procedure done on time. Please understand that emergencies sometimes delay scheduled procedures.  2. Diet: Do not eat solid foods after midnight.  The patient may have clear liquids until 5am upon the day of the procedure.  3. Labs: You will need to have blood drawn on TODAY  GO TO [place] ON Monday 08/05/19 @ 12:50 PM FOR COVID TESTING  4. Medication instructions in preparation for your procedure:  TAKE 1/2 THE DOSE OF INSULIN ON Wednesday 5/19 AND TAKE NONE ON Thursday 5/20 MORNING  DO NOT TAKE METFORMIN Thursday 5/20, Friday 5/21 OR Saturday 5/22= RESTART Sunday 5/23  On the morning of your procedure, take your Aspirin and any morning medicines NOT listed above.  You may use sips of water.  5. Plan for one night stay--bring personal belongings. 6. Bring a current list of your medications and current insurance cards. 7. You MUST have a responsible person to drive you home. 8. Someone MUST be with you the first 24 hours after you arrive home or your discharge will be delayed. 9. Please wear clothes that are easy to get on and off and wear slip-on shoes.  Thank you for allowing [place] to care for you!   -- [facility]  Your physician has requested that you have an echocardiogram. Echocardiography is a painless test that uses sound waves to create images of your  heart. It provides your doctor with information about the size and shape of your heart and how well your heart's chambers and valves are working. This procedure takes approximately one hour. There are no restrictions for this procedure.[facility] has requested that you have a carotid duplex. This test is an ultrasound of the carotid arteries in your neck. It looks at blood flow through these arteries that supply the brain with blood. Allow one hour for this exam. There are no restrictions or special instructions.[facility] physician has requested that you have a lower or upper extremity arterial duplex. This test is an ultrasound of the arteries in the legs or arms. It looks at arterial blood flow in the legs and arms. Allow one hour for Lower and Upper Arterial scans. There are no restrictions or special instructions ONE WEEK AFTER PROCEDURE  Your physician recommends that you schedule a follow-up appointment in: 2 [place]

## 2019-08-02 NOTE — H&P (View-Only) (Signed)
08/02/2019 Caleb [name]   May 05, 1950  [id number]  Primary Physician [name], MD Primary Cardiologist: Lorretta Harp MD Lupe Carney, [place]  HPI:  Caleb [name] is a 69 y.o. 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 Adah Perl, his podiatrist for evaluation of critical limb ischemia.  His wife of 62 years did die of ovarian cancer 3 years ago but he is very positive about meeting somebody new in his life.  He is very active and swims 4 days a week, walks 3 to 4 days a week and belongs to fitness.  He denies chest pain or shortness of breath.  His cardiac risk factors are notable for treated hypertension, diabetes and hyperlipidemia.  His father did die of a myocardial infarction age 67.  He is never had a heart attack or stroke.  He does complain of lifestyle and claudication however.  He developed wounds on his toes after walking 5 miles about [facility] with new shoes and has been treated by Dr. Adah Perl since that time with slow progression of wound healing.   Current Meds  Medication Sig  . Blood Glucose Monitoring Suppl (ONETOUCH VERIO) w/Device KIT 1 each by Does not apply route 2 (two) times daily. Use as instructed twice daily, check fasting sugar in morning and check before bedtime. E11.8  . collagenase (SANTYL) ointment Apply 1 application topically daily. Apply Santyl Ointment to scab on left 3rd and 4th digits. Measurements: Left 3rd toe 1.0 x 0.6 cm Left 4th toe: 2.0 x 1.0 cm  . Continuous Blood Gluc Receiver (FREESTYLE LIBRE 14 DAY READER) DEVI 1 each by Does not apply route See admin instructions. Use as directed to check blood glucose  . Continuous Blood Gluc Sensor (FREESTYLE LIBRE 14 DAY SENSOR) MISC INJECT 1 EACH INTO THE SKIN EVERY 14 (FOURTEEN) DAYS. E11.69  . gabapentin (NEURONTIN) 300 MG capsule Take 1 capsule (300 mg total) by  mouth 2 (two) times daily.  Marland Kitchen glucose blood test strip Use as instructed twice daily, check fasting sugar in morning and check before bedtime. E11.8  . HUMULIN 70/30 (70-30) 100 UNIT/ML injection INJECT 37 UNITS INTO THE SKIN 2 TIMES DAILY WITH A MEAL.  Marland Kitchen lisinopril-hydrochlorothiazide (ZESTORETIC) 20-12.5 MG tablet Take 2 tablets by mouth daily.  . metFORMIN (GLUCOPHAGE) 1000 MG tablet Take 1 tablet (1,000 mg total) by mouth 2 (two) times daily with a meal.  . metoprolol tartrate (LOPRESSOR) 100 MG tablet Take 1 tablet (100 mg total) by mouth 2 (two) times daily.  Glory Rosebush DELICA LANCETS 80D MISC Use as instructed twice daily, check fasting sugar in morning and check before bedtime. E11.8  . rosuvastatin (CRESTOR) 40 MG tablet Take 1 tablet (40 mg total) by mouth daily. To lower cholesterol  . sildenafil (VIAGRA) 50 MG tablet TAKE 2 TABLETS BY MOUTH DAILY AS NEEDED FOR ERECTILE DYSFUNCTION  . silver sulfADIAZINE (SILVADENE) 1 % cream Apply to affected toes once daily and cover with dressing.  . Vitamin D, Ergocalciferol, (DRISDOL) 50000 units CAPS capsule Take 1 capsule (50,000 Units total) by mouth every 7 (seven) days.     No Known Allergies  Social History   Socioeconomic History  . Marital status: Married    Spouse name: Not on file  . Number of children: Not on file  . Years of education: Not on file  . Highest education level: Not on  file  Occupational History  . Occupation: Caregiver    Comment: Special needs  Tobacco Use  . Smoking status: Former Smoker    Types: Cigarettes  . Smokeless tobacco: Never Used  Substance and Sexual Activity  . Alcohol use: Not Currently    Comment: rare  . Drug use: No  . Sexual activity: Not Currently  Other Topics Concern  . Not on file  Social History Narrative   Widower.  3 daughters and 3 grandchildren.     Social Determinants of Health   Financial Resource Strain:   . Difficulty of Paying Living Expenses:   Food Insecurity:     . Worried About Charity fundraiser in the Last Year:   . Arboriculturist in the Last Year:   Transportation Needs:   . Film/video editor (Medical):   Marland Kitchen Lack of Transportation (Non-Medical):   Physical Activity:   . Days of Exercise per Week:   . Minutes of Exercise per Session:   Stress:   . Feeling of Stress :   Social Connections:   . Frequency of Communication with Friends and Family:   . Frequency of Social Gatherings with Friends and Family:   . Attends Religious Services:   . Active Member of Clubs or Organizations:   . Attends Archivist Meetings:   Marland Kitchen Marital Status:   Intimate Partner Violence:   . Fear of Current or Ex-Partner:   . Emotionally Abused:   Marland Kitchen Physically Abused:   . Sexually Abused:      Review of Systems: General: negative for chills, fever, night sweats or weight changes.  Cardiovascular: negative for chest pain, dyspnea on exertion, edema, orthopnea, palpitations, paroxysmal nocturnal dyspnea or shortness of breath Dermatological: negative for rash Respiratory: negative for cough or wheezing Urologic: negative for hematuria Abdominal: negative for nausea, vomiting, diarrhea, bright red blood per rectum, melena, or hematemesis Neurologic: negative for visual changes, syncope, or dizziness All other systems reviewed and are otherwise negative except as noted above.    Blood pressure 134/82, pulse 96, temperature (!) 95.2 F (35.1 C), height '5\' 8"'  (1.727 m), weight 190 lb (86.2 kg).  General appearance: alert and no distress Neck: no adenopathy, no carotid bruit, no JVD, supple, symmetrical, trachea midline, thyroid not enlarged, symmetric, no tenderness/mass/nodules and Left carotid bruit Lungs: clear to auscultation bilaterally Heart: 2/6 outflow tract murmur consistent with aortic stenosis Extremities: extremities normal, atraumatic, no cyanosis or edema Pulses: Diminished pedal pulses Skin: Wounds on toes on right left  foot Neurologic: Alert and oriented X 3, normal strength and tone. Normal symmetric reflexes. Normal coordination and gait  EKG sinus rhythm at 96 with frequent PACs.  I personally reviewed this EKG.  ASSESSMENT AND PLAN:   Hypertension History of essential hypertension with blood pressure measured today at 134/82.  He is on lisinopril, hydrochlorothiazide and metoprolol.  Hyperlipidemia History of hyperlipidemia on statin therapy with lipid profile performed 01/15/2019 revealing total cholesterol 129, LDL 61 and HDL 47.  Cardiac murmur History of cardiac murmur with 2D echo performed 03/16/2018 revealing normal LV systolic function with mild aortic stenosis, valve area of 1.36 cm with a peak gradient of 25 mmHg.  He is asymptomatic from this.  We will recheck a 2D echo.  Critical limb ischemia with history of revascularization of same extremity Mr. Wakeley was referred to me by Dr. Felizardo Hoffmann, his podiatrist, for evaluation of critical limb ischemia.  He has developed wounds on his toes after  walking 5 miles in [facility] with new shoes.  These are being aggressively treated as an outpatient by Dr. Adah Perl with slow but excellent results.  He does complain of lifestyle of any claudication and had Doppler studies performed in our office 07/23/2019 revealing ABIs in the mid 7 range bilaterally with a high-frequency signal in the right SFA, occluded left popliteal and tibial vessel disease.  He wishes to proceed with angiography potential intervention for treatment of critical ischemia and/or lifestyle limiting claudication.      Lorretta Harp MD FACP,FACC,FAHA, [facility] 08/02/2019 9:11 AM

## 2019-08-02 NOTE — Progress Notes (Signed)
08/02/2019 Caleb [name]   06-Dec-1950  [id number]  Primary Physician [name], MD Primary Cardiologist: Lorretta Harp MD Lupe Carney, [place]  HPI:  Caleb [name] is a 69 y.o. 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 Adah Perl, his podiatrist for evaluation of critical limb ischemia.  His wife of 11 years did die of ovarian cancer 3 years ago but he is very positive about meeting somebody new in his life.  He is very active and swims 4 days a week, walks 3 to 4 days a week and belongs to fitness.  He denies chest pain or shortness of breath.  His cardiac risk factors are notable for treated hypertension, diabetes and hyperlipidemia.  His father did die of a myocardial infarction age 43.  He is never had a heart attack or stroke.  He does complain of lifestyle and claudication however.  He developed wounds on his toes after walking 5 miles about [facility] with new shoes and has been treated by Dr. Adah Perl since that time with slow progression of wound healing.   Current Meds  Medication Sig  . Blood Glucose Monitoring Suppl (ONETOUCH VERIO) w/Device KIT 1 each by Does not apply route 2 (two) times daily. Use as instructed twice daily, check fasting sugar in morning and check before bedtime. E11.8  . collagenase (SANTYL) ointment Apply 1 application topically daily. Apply Santyl Ointment to scab on left 3rd and 4th digits. Measurements: Left 3rd toe 1.0 x 0.6 cm Left 4th toe: 2.0 x 1.0 cm  . Continuous Blood Gluc Receiver (FREESTYLE LIBRE 14 DAY READER) DEVI 1 each by Does not apply route See admin instructions. Use as directed to check blood glucose  . Continuous Blood Gluc Sensor (FREESTYLE LIBRE 14 DAY SENSOR) MISC INJECT 1 EACH INTO THE SKIN EVERY 14 (FOURTEEN) DAYS. E11.69  . gabapentin (NEURONTIN) 300 MG capsule Take 1 capsule (300 mg total) by  mouth 2 (two) times daily.  Marland Kitchen glucose blood test strip Use as instructed twice daily, check fasting sugar in morning and check before bedtime. E11.8  . HUMULIN 70/30 (70-30) 100 UNIT/ML injection INJECT 37 UNITS INTO THE SKIN 2 TIMES DAILY WITH A MEAL.  Marland Kitchen lisinopril-hydrochlorothiazide (ZESTORETIC) 20-12.5 MG tablet Take 2 tablets by mouth daily.  . metFORMIN (GLUCOPHAGE) 1000 MG tablet Take 1 tablet (1,000 mg total) by mouth 2 (two) times daily with a meal.  . metoprolol tartrate (LOPRESSOR) 100 MG tablet Take 1 tablet (100 mg total) by mouth 2 (two) times daily.  Glory Rosebush DELICA LANCETS 81E MISC Use as instructed twice daily, check fasting sugar in morning and check before bedtime. E11.8  . rosuvastatin (CRESTOR) 40 MG tablet Take 1 tablet (40 mg total) by mouth daily. To lower cholesterol  . sildenafil (VIAGRA) 50 MG tablet TAKE 2 TABLETS BY MOUTH DAILY AS NEEDED FOR ERECTILE DYSFUNCTION  . silver sulfADIAZINE (SILVADENE) 1 % cream Apply to affected toes once daily and cover with dressing.  . Vitamin D, Ergocalciferol, (DRISDOL) 50000 units CAPS capsule Take 1 capsule (50,000 Units total) by mouth every 7 (seven) days.     No Known Allergies  Social History   Socioeconomic History  . Marital status: Married    Spouse name: Not on file  . Number of children: Not on file  . Years of education: Not on file  . Highest education level: Not on  file  Occupational History  . Occupation: Caregiver    Comment: Special needs  Tobacco Use  . Smoking status: Former Smoker    Types: Cigarettes  . Smokeless tobacco: Never Used  Substance and Sexual Activity  . Alcohol use: Not Currently    Comment: rare  . Drug use: No  . Sexual activity: Not Currently  Other Topics Concern  . Not on file  Social History Narrative   Widower.  3 daughters and 3 grandchildren.     Social Determinants of Health   Financial Resource Strain:   . Difficulty of Paying Living Expenses:   Food Insecurity:     . Worried About Charity fundraiser in the Last Year:   . Arboriculturist in the Last Year:   Transportation Needs:   . Film/video editor (Medical):   Marland Kitchen Lack of Transportation (Non-Medical):   Physical Activity:   . Days of Exercise per Week:   . Minutes of Exercise per Session:   Stress:   . Feeling of Stress :   Social Connections:   . Frequency of Communication with Friends and Family:   . Frequency of Social Gatherings with Friends and Family:   . Attends Religious Services:   . Active Member of Clubs or Organizations:   . Attends Archivist Meetings:   Marland Kitchen Marital Status:   Intimate Partner Violence:   . Fear of Current or Ex-Partner:   . Emotionally Abused:   Marland Kitchen Physically Abused:   . Sexually Abused:      Review of Systems: General: negative for chills, fever, night sweats or weight changes.  Cardiovascular: negative for chest pain, dyspnea on exertion, edema, orthopnea, palpitations, paroxysmal nocturnal dyspnea or shortness of breath Dermatological: negative for rash Respiratory: negative for cough or wheezing Urologic: negative for hematuria Abdominal: negative for nausea, vomiting, diarrhea, bright red blood per rectum, melena, or hematemesis Neurologic: negative for visual changes, syncope, or dizziness All other systems reviewed and are otherwise negative except as noted above.    Blood pressure 134/82, pulse 96, temperature (!) 95.2 F (35.1 C), height '5\' 8"'  (1.727 m), weight 190 lb (86.2 kg).  General appearance: alert and no distress Neck: no adenopathy, no carotid bruit, no JVD, supple, symmetrical, trachea midline, thyroid not enlarged, symmetric, no tenderness/mass/nodules and Left carotid bruit Lungs: clear to auscultation bilaterally Heart: 2/6 outflow tract murmur consistent with aortic stenosis Extremities: extremities normal, atraumatic, no cyanosis or edema Pulses: Diminished pedal pulses Skin: Wounds on toes on right left  foot Neurologic: Alert and oriented X 3, normal strength and tone. Normal symmetric reflexes. Normal coordination and gait  EKG sinus rhythm at 96 with frequent PACs.  I personally reviewed this EKG.  ASSESSMENT AND PLAN:   Hypertension History of essential hypertension with blood pressure measured today at 134/82.  He is on lisinopril, hydrochlorothiazide and metoprolol.  Hyperlipidemia History of hyperlipidemia on statin therapy with lipid profile performed 01/15/2019 revealing total cholesterol 129, LDL 61 and HDL 47.  Cardiac murmur History of cardiac murmur with 2D echo performed 03/16/2018 revealing normal LV systolic function with mild aortic stenosis, valve area of 1.36 cm with a peak gradient of 25 mmHg.  He is asymptomatic from this.  We will recheck a 2D echo.  Critical limb ischemia with history of revascularization of same extremity Mr. Brining was referred to me by Dr. Felizardo Hoffmann, his podiatrist, for evaluation of critical limb ischemia.  He has developed wounds on his toes after  walking 5 miles in [facility] with new shoes.  These are being aggressively treated as an outpatient by Dr. Adah Perl with slow but excellent results.  He does complain of lifestyle of any claudication and had Doppler studies performed in our office 07/23/2019 revealing ABIs in the mid 7 range bilaterally with a high-frequency signal in the right SFA, occluded left popliteal and tibial vessel disease.  He wishes to proceed with angiography potential intervention for treatment of critical ischemia and/or lifestyle limiting claudication.      Lorretta Harp MD FACP,FACC,FAHA, [facility] 08/02/2019 9:11 AM

## 2019-08-02 NOTE — Assessment & Plan Note (Signed)
History of essential hypertension with blood pressure measured today at 134/82.  He is on lisinopril, hydrochlorothiazide and metoprolol.

## 2019-08-02 NOTE — Assessment & Plan Note (Signed)
History of cardiac murmur with 2D echo performed 03/16/2018 revealing normal LV systolic function with mild aortic stenosis, valve area of 1.36 cm with a peak gradient of 25 mmHg.  He is asymptomatic from this.  We will recheck a 2D echo.

## 2019-08-02 NOTE — Assessment & Plan Note (Signed)
History of hyperlipidemia on statin therapy with lipid profile performed 01/15/2019 revealing total cholesterol 129, LDL 61 and HDL 47.

## 2019-08-05 ENCOUNTER — Other Ambulatory Visit (HOSPITAL_COMMUNITY)
Admission: RE | Admit: 2019-08-05 | Discharge: 2019-08-05 | Disposition: A | Discharge status: Home / Self Care | Payer: Medicare HMO | Source: Ambulatory Visit | Attending: Cardiovascular Disease | Admitting: Cardiovascular Disease

## 2019-08-05 ENCOUNTER — Ambulatory Visit (INDEPENDENT_AMBULATORY_CARE_PROVIDER_SITE_OTHER): Payer: Medicare HMO | Admitting: Podiatry

## 2019-08-05 ENCOUNTER — Encounter: Payer: Self-pay | Admitting: Podiatry

## 2019-08-05 ENCOUNTER — Other Ambulatory Visit: Payer: Self-pay

## 2019-08-05 VITALS — Temp 97.2°F

## 2019-08-05 DIAGNOSIS — Z20822 Contact with and (suspected) exposure to covid-19: Secondary | ICD-10-CM | POA: Insufficient documentation

## 2019-08-05 DIAGNOSIS — E1142 Type 2 diabetes mellitus with diabetic polyneuropathy: Secondary | ICD-10-CM | POA: Diagnosis not present

## 2019-08-05 DIAGNOSIS — L97502 Non-pressure chronic ulcer of other part of unspecified foot with fat layer exposed: Secondary | ICD-10-CM | POA: Diagnosis not present

## 2019-08-05 DIAGNOSIS — I998 Other disorder of circulatory system: Secondary | ICD-10-CM

## 2019-08-05 DIAGNOSIS — E11621 Type 2 diabetes mellitus with foot ulcer: Secondary | ICD-10-CM | POA: Diagnosis not present

## 2019-08-05 DIAGNOSIS — I70229 Atherosclerosis of native arteries of extremities with rest pain, unspecified extremity: Secondary | ICD-10-CM

## 2019-08-05 DIAGNOSIS — Z01812 Encounter for preprocedural laboratory examination: Secondary | ICD-10-CM | POA: Insufficient documentation

## 2019-08-05 NOTE — Progress Notes (Signed)
Subjective:  Patient ID: Caleb [name], male    DOB: 09-Apr-1950,  MRN: [id number]  Mr. Michon is seen for weekly follow up of ulceration to the L hallux, L 2nd toe, L 3rd toe, L 4th toe, R hallux and R 2nd toe secondary to wearing ill-fitting shoe gear. Date of initial presentation was 07/09/2019. He relates no new concerns on today's visit. He was seen by Dr. Gwenlyn Found for CLI and has been scheduled for peripheral angiography.   His daughter has been performing daily dressing changes of Silvadene Cream to b/l hallux, b/l 2nd toes and Santyl Ointment to eschar of left 3rd and 4th digits.  Mr. Burleson also states he has carotid blockages as well that will be addressed by Vascular.  Digits continue to heal slowly. He denies any odor, redness, drainage or change in color of digits. Denies any fever, chills, nightsweats, nausea or vomiting. He suspects right 2nd toe has healed.   Past Medical History:  Diagnosis Date  . Diabetes mellitus without complication ([place])   . Dyslipidemia 09/27/2012  . Erectile dysfunction 09/27/2012  . Heart murmur   . Hypercholesteremia   . Hyperlipidemia 08/12/2012  . Hypertension   . Hyponatremia 08/14/2012  . Neuropathy   . Pancreatitis   . Pancreatitis, acute 09/27/2012  . Vitamin D deficiency 06/23/2015     Past Surgical History:  Procedure Laterality Date  . COLONOSCOPY  12 years ago   in [place] clinic= normal exam per pt  . KNEE SURGERY       Current Outpatient Medications on File Prior to Visit  Medication Sig Dispense Refill  . aspirin EC 81 MG tablet Take 81 mg by mouth daily.    . Blood Glucose Monitoring Suppl (ONETOUCH VERIO) w/Device KIT 1 each by Does not apply route 2 (two) times daily. Use as instructed twice daily, check fasting sugar in morning and check before bedtime. E11.8 1 kit 0  . collagenase (SANTYL) ointment Apply 1 application topically daily. Apply Santyl Ointment to scab on left 3rd and 4th digits. Measurements: Left 3rd toe 1.0 x 0.6 cm  Left 4th toe: 2.0 x 1.0 cm (Patient taking differently: Apply 1 application topically daily as needed (Scab). Apply Santyl Ointment to scab on left 3rd and 4th digits. Measurements: Left 3rd toe 1.0 x 0.6 cm Left 4th toe: 2.0 x 1.0 cm) 30 g 0  . Continuous Blood Gluc Receiver (FREESTYLE LIBRE 14 DAY READER) DEVI 1 each by Does not apply route See admin instructions. Use as directed to check blood glucose 1 Device 0  . Continuous Blood Gluc Sensor (FREESTYLE LIBRE 14 DAY SENSOR) MISC INJECT 1 EACH INTO THE SKIN EVERY 14 (FOURTEEN) DAYS. E11.69 2 each 2  . gabapentin (NEURONTIN) 300 MG capsule Take 1 capsule (300 mg total) by mouth 2 (two) times daily. (Patient taking differently: Take 300 mg by mouth daily as needed (pain). ) 180 capsule 1  . glucose blood test strip Use as instructed twice daily, check fasting sugar in morning and check before bedtime. E11.8 100 each 12  . HUMULIN 70/30 (70-30) 100 UNIT/ML injection INJECT 37 UNITS INTO THE SKIN 2 TIMES DAILY WITH A MEAL. (Patient taking differently: Inject 37 Units into the skin 2 (two) times daily as needed (high blood sugar). Under 110 do not take) 60 mL 6  . lisinopril-hydrochlorothiazide (ZESTORETIC) 20-12.5 MG tablet Take 2 tablets by mouth daily. 180 tablet 1  . metFORMIN (GLUCOPHAGE) 1000 MG tablet Take 1 tablet (1,000 mg total) by  mouth 2 (two) times daily with a meal. (Patient taking differently: Take 1,000 mg by mouth daily. ) 360 tablet 1  . metoprolol tartrate (LOPRESSOR) 100 MG tablet Take 1 tablet (100 mg total) by mouth 2 (two) times daily. 180 tablet 1  . ONETOUCH DELICA LANCETS 58N MISC Use as instructed twice daily, check fasting sugar in morning and check before bedtime. E11.8 100 each 12  . rosuvastatin (CRESTOR) 40 MG tablet Take 1 tablet (40 mg total) by mouth daily. To lower cholesterol 90 tablet 1  . sildenafil (VIAGRA) 50 MG tablet TAKE 2 TABLETS BY MOUTH DAILY AS NEEDED FOR ERECTILE DYSFUNCTION (Patient taking differently: Take  50-100 mg by mouth as needed for erectile dysfunction. ) 20 tablet 1  . silver sulfADIAZINE (SILVADENE) 1 % cream Apply to affected toes once daily and cover with dressing. (Patient taking differently: Apply 1 application topically See admin instructions. Apply to affected toes once daily and cover with dressing.) 400 g 0  . Vitamin D, Ergocalciferol, (DRISDOL) 50000 units CAPS capsule Take 1 capsule (50,000 Units total) by mouth every 7 (seven) days. 12 capsule 0   Current Facility-Administered Medications on File Prior to Visit  Medication Dose Route Frequency Provider Last Rate Last Admin  . sodium chloride flush (NS) 0.9 % injection 3 mL  3 mL Intravenous Q12H Lorretta Harp, MD         No Known Allergies   Family History  Problem Relation Age of Onset  . CAD Father   . Hypertension Father   . Alcohol abuse Father        Cause of death  . Diabetes Mother   . Colon polyps Mother   . CAD Brother 62       CABG  . Colon cancer Neg Hx   . Esophageal cancer Neg Hx   . Rectal cancer Neg Hx   . Stomach cancer Neg Hx      Social History   Occupational History  . Occupation: Caregiver    Comment: Special needs  Tobacco Use  . Smoking status: Former Smoker    Types: Cigarettes  . Smokeless tobacco: Never Used  Substance and Sexual Activity  . Alcohol use: Not Currently    Comment: rare  . Drug use: No  . Sexual activity: Not Currently     Immunization History  Administered Date(s) Administered  . Pneumococcal Conjugate-13 09/13/2016  . Pneumococcal Polysaccharide-23 08/15/2012, 01/14/2019  . Tdap 06/06/2016   Objective:  Physical Exam: Vitals:   08/05/19 0933  Temp: (!) 97.2 F (36.2 C)     69 y.o. AA male WD, WN in NAD. AAO x 3.  Capillary refill time to digits <4 seconds b/l. Palpable DP pulses b/l. Palpable PT pulses b/l. Pedal hair absent b/l Skin temperature gradient within normal limits b/l. No edema noted b/l.  Pedal skin with normal turgor, texture and  tone bilaterally.             Wound  #1 Location: L hallux There is a moderate amount of nonviable hyperkeratosis surrounding the periphery of the wound. Predebridement Wound Measurement: 0.7 x 0.5 x 0.1 cm Postdebridement Wound Measurement: 0.7 x 0.5 x 0.1 cm. Wound Base: Mixed Granular/Fibrotic Peri-wound: Calloused Exudate: None: wound tissue dry Blood Loss during debridement: 0 cc's. Description of tissue removed from ulceration today: Calloused periphery. Signs of clinical bacterial infection are absent. Material in wound which inhibits healing/promotes adjacent tissue breakdown: None.  Wound Location: L 2nd toe There is a  moderate amount of nonviable hyperkeratosis surrounding the periphery of the wound. Predebridement Wound Measurement: 0.6 x 1.0 x 0.2 cm Postdebridement Wound Measurement:1.0 x 1.0 x 0.2 cm Wound Base: Granular/Healthy Peri-wound: Calloused Exudate: None: wound tissue dry Blood Loss during debridement: 0 cc's. Description of tissue removed from ulceration today:  Hyperkeratotic. Signs of clinical bacterial infection are absent. Material in wound which inhibits healing/promotes adjacent tissue breakdown: None  Wound Location: L 3rd toe There is moderate amount of periwound hyperkeratosis and small amount of eschar present in the wound. Predebridement Wound Measurement:0.8 x 1.0 x 0.2 cm. Postdebridement Wound Measurement: 2.0 x 0.7 x 0.2 cm Wound Base: fibrogranular with eschar center Peri-wound: calloused Exudate: None: wound tissue dry Blood Loss during debridement: <1 cc's. Description of tissue removed from ulceration today:  Eschar and surrounding hyperkeratosis. Signs of clinical bacterial infection are absent. Material in wound which inhibits healing/promotes adjacent tissue breakdown:  Eschar and periwound hyperkeratosis.  Wound Location: L 4th toe There is a moderate amount of eschar present in the wound. Predebridement Wound  Measurement: 2.0 x 1.0 x 0.2 cm Postdebridement Wound Measurement:3.0 x 2.5 x 0.2 cm Wound Base: Necrotic eschar Peri-wound: Calloused Exudate: None: wound tissue dry Blood Loss during debridement: <1 cc. Description of tissue removed from ulceration today:  periwound callous Signs of clinical bacterial infection are absent. Material in wound which inhibits healing/promotes adjacent tissue breakdown: centralized eschar  Wound Location: R hallux There is a moderate amount of hyperkeratosis present surrounding the periwound. Predebridement Wound Measurement: 1.0 x 0.7 x 0.2 cm Postdebridement Wound Measurement: 1.0 x 0.7 x 0.2 cm Wound Base:red granular Peri-wound: Calloused Exudate: None: wound tissue dry Blood Loss during debridement: 0 cc's. Description of tissue removed from ulceration today: periwound hyperkeratosis. Signs of clinical bacterial infection are absent. Material in wound which inhibits healing/promotes adjacent tissue breakdown: periwound callous  Wound Location: Right 2nd digit There is a moderate amount of hyperkeratosis present in the wound. Predebridement Wound Measurement: 2.0 x 2.0 cm Postdebridement Wound Measurement: 0.3 x 0.2x 0.1 cm Wound Base: Granular/Healthy Peri-wound: Calloused Exudate: None: wound tissue dry Blood Loss during debridement: <1 cc's. Description of tissue removed from ulceration today:  Hyperkeratotic. Signs of clinical bacterial infection are absent. Material in wound which inhibits healing/promotes adjacent tissue breakdown:  Hyperkeratotic.  Normal muscle strength 5/5 to all lower extremity muscle groups bilaterally. No pain crepitus or joint limitation noted with ROM b/l. Hallux valgus with bunion deformity noted b/l. Hammertoes noted to the 2-5 bilaterally. Patient ambulates independent of any assistive aids.  Protective sensation diminished with 10g monofilament b/l. Vibratory sensation diminished b/l.  Hemoglobin A1C Latest  Ref Rng & Units 01/16/2019  HGBA1C 0.0 - 7.0 % 7.6(A)  Some recent data might be hidden   Lab Results  Component Value Date   HGBA1C 7.6 (A) 01/16/2019    Assessment: 1. Diabetic Ulceration b/l hallux, b/l 2nd, left 3rd and left 4th digits 2. Critical Limb Ischemia 3. NIDDM with neuropathy  Plan:  -Examined patient. -Continue diabetic foot care principles. Literature dispensed on today.  -Patient to continue Darco shoe to left foot and soft, supportive shoe gear to right foot daily. -Patient to report any pedal injuries to medical professional immediately. -Change in treatment plan: Continue daily Silvadene Cream treatment to b/l hallux, b/l 2nd, and left 3rd digit. Continue daily Santyl Ointment to centralized eschar of left 4th digit. -Patient/POA to call should there be question/concern in the interim. -All wounds are progressing towards healing. Improved. Right 2nd  digit nearly resolved. -Patient member was evaluated and treated and all questions answered.  -Patient/POA/Family member educated on diagnosis and treatment plan of routine ulcer debridement/wound care.  -Ulcers debrided utilizing: sterile tissue nipper and sterile scalpel blade. -Patient risk factors affecting healing of ulcer: uncontrolled diabetes, PAD, neuropathy, digital deformities, dyslipidemia Frequency of debridement needed to achieve healing: weekly visits/ulcer debridements. -He is scheduled for peripheral angiography with Dr. Gwenlyn Found this week.  Return in about 1 week (around 08/12/2019) for diabetic ulcer bilateral multiple toes.  Marzetta Board, DPM

## 2019-08-05 NOTE — Patient Instructions (Addendum)
DRESSING CHANGES LEFT FOOT:  WEAR SURGICAL SHOE AT ALL TIMES    1. KEEP LEFT  FOOT DRY AT ALL TIMES!!!!  2. CLEANSE ULCER WITH SALINE.  3. DAB DRY WITH GAUZE SPONGE.  4. APPLY A LIGHT AMOUNT OF SANTYL TO ESCHAR OF LEFT 4TH TOE. APPLY SILVADENE CREAM TO BOTH GREAT TOES, LEFT 2ND TOE AND LEFT 3RD TOE.  5. APPLY OUTER DRESSING/BAND-AID AS INSTRUCTED.  6. WEAR SURGICAL SHOE DAILY AT ALL TIMES.  7. DO NOT WALK BAREFOOT!!!  8.  IF YOU EXPERIENCE ANY FEVER, CHILLS, NIGHTSWEATS, NAUSEA OR VOMITING, ELEVATED OR LOW BLOOD SUGARS, REPORT TO EMERGENCY ROOM.  9. IF YOU EXPERIENCE INCREASED REDNESS, PAIN, SWELLING, DISCOLORATION, ODOR, PUS, DRAINAGE OR WARMTH OF YOUR FOOT, REPORT TO EMERGENCY ROOM.   Diabetes Mellitus and Foot Care Foot care is an important part of your health, especially when you have diabetes. Diabetes may cause you to have problems because of poor blood flow (circulation) to your feet and legs, which can cause your skin to:  Become thinner and drier.  Break more easily.  Heal more slowly.  Peel and crack. You may also have nerve damage (neuropathy) in your legs and feet, causing decreased feeling in them. This means that you may not notice minor injuries to your feet that could lead to more serious problems. Noticing and addressing any potential problems early is the best way to prevent future foot problems. How to care for your feet Foot hygiene  Wash your feet daily with warm water and mild soap. Do not use hot water. Then, pat your feet and the areas between your toes until they are completely dry. Do not soak your feet as this can dry your skin.  Trim your toenails straight across. Do not dig under them or around the cuticle. File the edges of your nails with an emery board or nail file.  Apply a moisturizing lotion or petroleum jelly to the skin on your feet and to dry, brittle toenails. Use lotion that does not contain alcohol and is unscented. Do not apply  lotion between your toes. Shoes and socks  Wear clean socks or stockings every day. Make sure they are not too tight. Do not wear knee-high stockings since they may decrease blood flow to your legs.  Wear shoes that fit properly and have enough cushioning. Always look in your shoes before you put them on to be sure there are no objects inside.  To break in new shoes, wear them for just a few hours a day. This prevents injuries on your feet. Wounds, scrapes, corns, and calluses  Check your feet daily for blisters, cuts, bruises, sores, and redness. If you cannot see the bottom of your feet, use a mirror or ask someone for help.  Do not cut corns or calluses or try to remove them with medicine.  If you find a minor scrape, cut, or break in the skin on your feet, keep it and the skin around it clean and dry. You may clean these areas with mild soap and water. Do not clean the area with peroxide, alcohol, or iodine.  If you have a wound, scrape, corn, or callus on your foot, look at it several times a day to make sure it is healing and not infected. Check for: ? Redness, swelling, or pain. ? Fluid or blood. ? Warmth. ? Pus or a bad smell. General instructions  Do not cross your legs. This may decrease blood flow to your feet.  Do not use heating pads or hot water bottles on your feet. They may burn your skin. If you have lost feeling in your feet or legs, you may not know this is happening until it is too late.  Protect your feet from hot and cold by wearing shoes, such as at the beach or on hot pavement.  Schedule a complete foot exam at least once a year (annually) or more often if you have foot problems. If you have foot problems, report any cuts, sores, or bruises to your health care provider immediately. Contact a health care provider if:  You have a medical condition that increases your risk of infection and you have any cuts, sores, or bruises on your feet.  You have an injury  that is not healing.  You have redness on your legs or feet.  You feel burning or tingling in your legs or feet.  You have pain or cramps in your legs and feet.  Your legs or feet are numb.  Your feet always feel cold.  You have pain around a toenail. Get help right away if:  You have a wound, scrape, corn, or callus on your foot and: ? You have pain, swelling, or redness that gets worse. ? You have fluid or blood coming from the wound, scrape, corn, or callus. ? Your wound, scrape, corn, or callus feels warm to the touch. ? You have pus or a bad smell coming from the wound, scrape, corn, or callus. ? You have a fever. ? You have a red line going up your leg. Summary  Check your feet every day for cuts, sores, red spots, swelling, and blisters.  Moisturize feet and legs daily.  Wear shoes that fit properly and have enough cushioning.  If you have foot problems, report any cuts, sores, or bruises to your health care provider immediately.  Schedule a complete foot exam at least once a year (annually) or more often if you have foot problems. This information is not intended to replace advice given to you by your health care provider. Make sure you discuss any questions you have with your health care provider. Document Revised: 11/28/2018 Document Reviewed: 04/08/2016 Elsevier Patient Education  [place].

## 2019-08-06 LAB — SARS CORONAVIRUS 2 (TAT 6-24 HRS): SARS Coronavirus 2: NEGATIVE

## 2019-08-07 ENCOUNTER — Telehealth: Payer: Self-pay | Admitting: *Deleted

## 2019-08-07 NOTE — Telephone Encounter (Signed)
Pt contacted pre-catheterization scheduled at [facility] for: Thursday  Aug 08, 2019 11:30 AM Verified arrival time and place: [place] [facility]) at: 6:30 AM-pre procedure hydration   No solid food after midnight prior to cath, clear liquids until 5 AM day of procedure.  Hold: Insulin-1/2 usual PM dose  Metformin-day of procedure and 48 hours post procedure. Lisinopril-HCT-AM of procedure Viagra-until post procedure  Except hold medications AM meds can be  taken pre-cath with sip of water including: ASA 81 mg   Confirmed patient has responsible adult to drive home post procedure and observe 24 hours after arriving home: yes  You are allowed ONE visitor in the waiting room during your procedure. Both you and your visitor must wear masks.      COVID-19 Pre-Screening Questions:  . In the past 7 to 10 days have you had a cough,  shortness of breath, headache, congestion, fever (100 or greater) body aches, chills, sore throat, or sudden loss of taste or sense of smell? no . Have you been around anyone with known Covid 19 in the past 7 to 10 days? no . Have you been around anyone who is awaiting Covid 19 test results in the past 7 to 10 days? no . Have you been around anyone who has mentioned symptoms of Covid 19 within the past 7 to 10 days? No  Reviewed procedure/mask/visitor instructions, COVID-19 screening questions with patient.

## 2019-08-08 ENCOUNTER — Ambulatory Visit (HOSPITAL_COMMUNITY)
Admission: RE | Admit: 2019-08-08 | Discharge: 2019-08-08 | Disposition: A | Discharge status: Home / Self Care | Payer: Medicare HMO | Attending: Cardiovascular Disease | Admitting: Cardiovascular Disease

## 2019-08-08 ENCOUNTER — Other Ambulatory Visit (HOSPITAL_COMMUNITY): Payer: Self-pay | Admitting: Physician Assistant

## 2019-08-08 ENCOUNTER — Encounter (HOSPITAL_COMMUNITY)
Admission: RE | Disposition: A | Discharge status: Home / Self Care | Payer: Self-pay | Source: Home / Self Care | Attending: Cardiovascular Disease

## 2019-08-08 DIAGNOSIS — I70211 Atherosclerosis of native arteries of extremities with intermittent claudication, right leg: Secondary | ICD-10-CM | POA: Diagnosis not present

## 2019-08-08 DIAGNOSIS — Z9889 Other specified postprocedural states: Secondary | ICD-10-CM | POA: Diagnosis present

## 2019-08-08 DIAGNOSIS — E782 Mixed hyperlipidemia: Secondary | ICD-10-CM

## 2019-08-08 DIAGNOSIS — R0989 Other specified symptoms and signs involving the circulatory and respiratory systems: Secondary | ICD-10-CM

## 2019-08-08 DIAGNOSIS — R011 Cardiac murmur, unspecified: Secondary | ICD-10-CM

## 2019-08-08 DIAGNOSIS — I10 Essential (primary) hypertension: Secondary | ICD-10-CM

## 2019-08-08 DIAGNOSIS — I70229 Atherosclerosis of native arteries of extremities with rest pain, unspecified extremity: Secondary | ICD-10-CM | POA: Diagnosis present

## 2019-08-08 DIAGNOSIS — S91109D Unspecified open wound of unspecified toe(s) without damage to nail, subsequent encounter: Secondary | ICD-10-CM

## 2019-08-08 HISTORY — PX: ABDOMINAL AORTOGRAM W/LOWER EXTREMITY: CATH118223

## 2019-08-08 HISTORY — PX: PERIPHERAL VASCULAR INTERVENTION: CATH118257

## 2019-08-08 LAB — POCT I-STAT, CHEM 8
BUN: 19 mg/dL (ref 8–23)
Calcium, Ion: 1.25 mmol/L (ref 1.15–1.40)
Chloride: 104 mmol/L (ref 98–111)
Creatinine, Ser: 1.3 mg/dL — ABNORMAL HIGH (ref 0.61–1.24)
Glucose, Bld: 123 mg/dL — ABNORMAL HIGH (ref 70–99)
HCT: 44 % (ref 39.0–52.0)
Hemoglobin: 15 g/dL (ref 13.0–17.0)
Potassium: 4.5 mmol/L (ref 3.5–5.1)
Sodium: 140 mmol/L (ref 135–145)
TCO2: 31 mmol/L (ref 22–32)

## 2019-08-08 LAB — BASIC METABOLIC PANEL
Anion gap: 12 (ref 5–15)
BUN: 17 mg/dL (ref 8–23)
CO2: 23 mmol/L (ref 22–32)
Calcium: 9 mg/dL (ref 8.9–10.3)
Chloride: 103 mmol/L (ref 98–111)
Creatinine, Ser: 1.22 mg/dL (ref 0.61–1.24)
GFR calc Af Amer: >60 mL/min (ref >60)
GFR calc non Af Amer: >60 mL/min (ref >60)
Glucose, Bld: 142 mg/dL — ABNORMAL HIGH (ref 70–99)
Potassium: 4.7 mmol/L (ref 3.5–5.1)
Sodium: 138 mmol/L (ref 135–145)

## 2019-08-08 LAB — GLUCOSE, CAPILLARY: Glucose-Capillary: 138 mg/dL — ABNORMAL HIGH (ref 70–99)

## 2019-08-08 LAB — POCT ACTIVATED CLOTTING TIME
Activated Clotting Time: 274 seconds
Activated Clotting Time: 290 seconds

## 2019-08-08 SURGERY — ABDOMINAL AORTOGRAM W/LOWER EXTREMITY
Anesthesia: LOCAL | Laterality: Right

## 2019-08-08 MED ORDER — HEPARIN (PORCINE) IN NACL 1000-0.9 UT/500ML-% IV SOLN
INTRAVENOUS | Status: AC
  Filled 2019-08-08: qty 500

## 2019-08-08 MED ORDER — ASPIRIN EC 81 MG PO TBEC: 81.0000 mg | DELAYED_RELEASE_TABLET | Freq: Every day | ORAL | Status: DC

## 2019-08-08 MED ORDER — CLOPIDOGREL BISULFATE 300 MG PO TABS
ORAL_TABLET | ORAL | Status: AC
  Filled 2019-08-08: qty 1

## 2019-08-08 MED ORDER — LIDOCAINE HCL (PF) 1 % IJ SOLN
INTRAMUSCULAR | Status: AC
  Filled 2019-08-08: qty 30

## 2019-08-08 MED ORDER — MIDAZOLAM HCL 2 MG/2ML IJ SOLN
INTRAMUSCULAR | Status: DC | PRN
  Administered 2019-08-08: 1 mg via INTRAVENOUS

## 2019-08-08 MED ORDER — FENTANYL CITRATE (PF) 100 MCG/2ML IJ SOLN
INTRAMUSCULAR | Status: DC | PRN
  Administered 2019-08-08: 25 ug via INTRAVENOUS

## 2019-08-08 MED ORDER — ACETAMINOPHEN 325 MG PO TABS: 650.0000 mg | ORAL_TABLET | ORAL | Status: DC | PRN

## 2019-08-08 MED ORDER — MIDAZOLAM HCL 2 MG/2ML IJ SOLN
INTRAMUSCULAR | Status: AC
  Filled 2019-08-08: qty 2

## 2019-08-08 MED ORDER — SODIUM CHLORIDE 0.9 % WEIGHT BASED INFUSION
3.0000 mL/kg/h | INTRAVENOUS | Status: AC
  Administered 2019-08-08: 3 mL/kg/h via INTRAVENOUS

## 2019-08-08 MED ORDER — SODIUM CHLORIDE 0.9% FLUSH: 3.0000 mL | INTRAVENOUS | Status: DC | PRN

## 2019-08-08 MED ORDER — HEPARIN (PORCINE) IN NACL 1000-0.9 UT/500ML-% IV SOLN
INTRAVENOUS | Status: DC | PRN
  Administered 2019-08-08: 500 mL

## 2019-08-08 MED ORDER — CLOPIDOGREL BISULFATE 75 MG PO TABS: 75.0000 mg | ORAL_TABLET | Freq: Every day | ORAL | Status: DC

## 2019-08-08 MED ORDER — MORPHINE SULFATE (PF) 2 MG/ML IV SOLN: 2.0000 mg | INTRAVENOUS | Status: DC | PRN

## 2019-08-08 MED ORDER — SODIUM CHLORIDE 0.9% FLUSH: 3.0000 mL | Freq: Two times a day (BID) | INTRAVENOUS | Status: DC

## 2019-08-08 MED ORDER — HYDRALAZINE HCL 20 MG/ML IJ SOLN: 5.0000 mg | INTRAMUSCULAR | Status: DC | PRN

## 2019-08-08 MED ORDER — SODIUM CHLORIDE 0.9 % IV SOLN: 250.0000 mL | INTRAVENOUS | Status: DC | PRN

## 2019-08-08 MED ORDER — ATORVASTATIN CALCIUM 80 MG PO TABS
80.0000 mg | ORAL_TABLET | Freq: Every day | ORAL | Status: DC
  Filled 2019-08-08: qty 1

## 2019-08-08 MED ORDER — ASPIRIN 81 MG PO CHEW: 81.0000 mg | CHEWABLE_TABLET | ORAL | Status: DC

## 2019-08-08 MED ORDER — FENTANYL CITRATE (PF) 100 MCG/2ML IJ SOLN
INTRAMUSCULAR | Status: AC
  Filled 2019-08-08: qty 2

## 2019-08-08 MED ORDER — HEPARIN SODIUM (PORCINE) 1000 UNIT/ML IJ SOLN
INTRAMUSCULAR | Status: AC
  Filled 2019-08-08: qty 1

## 2019-08-08 MED ORDER — ONDANSETRON HCL 4 MG/2ML IJ SOLN: 4.0000 mg | Freq: Four times a day (QID) | INTRAMUSCULAR | Status: DC | PRN

## 2019-08-08 MED ORDER — SODIUM CHLORIDE 0.9 % WEIGHT BASED INFUSION: 1.0000 mL/kg/h | INTRAVENOUS | Status: DC

## 2019-08-08 MED ORDER — LABETALOL HCL 5 MG/ML IV SOLN: 10.0000 mg | INTRAVENOUS | Status: DC | PRN

## 2019-08-08 MED ORDER — CLOPIDOGREL BISULFATE 75 MG PO TABS: 75.0000 mg | ORAL_TABLET | Freq: Every day | ORAL | 11 refills | Status: DC

## 2019-08-08 MED ORDER — SODIUM CHLORIDE 0.9 % IV SOLN: INTRAVENOUS | Status: DC

## 2019-08-08 MED ORDER — IODIXANOL 320 MG/ML IV SOLN
INTRAVENOUS | Status: DC | PRN
  Administered 2019-08-08: 205 mL via INTRA_ARTERIAL

## 2019-08-08 MED ORDER — HYDRALAZINE HCL 20 MG/ML IJ SOLN
INTRAMUSCULAR | Status: AC
  Filled 2019-08-08: qty 1

## 2019-08-08 MED ORDER — HEPARIN SODIUM (PORCINE) 1000 UNIT/ML IJ SOLN
INTRAMUSCULAR | Status: DC | PRN
  Administered 2019-08-08: 3000 [IU] via INTRAVENOUS
  Administered 2019-08-08: 10000 [IU] via INTRAVENOUS

## 2019-08-08 MED FILL — CLOPIDOGREL 75 MG TABLET: 75 | 30 days supply | Qty: 30 | Fill #0

## 2019-08-08 SURGICAL SUPPLY — 24 items
BAG SNAP BAND KOVER 36X36 (MISCELLANEOUS) ×1 IMPLANT
BALLN COYOTE ES OTW 2.5X40X145 (BALLOONS) ×3
BALLN IN.PACT DCB 5X80 (BALLOONS) ×6
BALLOON CYTE ES OTW 2.5X40X145 (BALLOONS) IMPLANT
CATH ANGIO 5F PIGTAIL 65CM (CATHETERS) ×1 IMPLANT
CATH CROSS OVER TEMPO 5F (CATHETERS) ×1 IMPLANT
CATH HAWKONE LS STANDARD TIP (CATHETERS) ×3
CATH HAWKONE LS STD TIP (CATHETERS) IMPLANT
CLOSURE MYNX CONTROL 6F/7F (Vascular Products) ×1 IMPLANT
DCB IN.PACT 5X80 (BALLOONS) IMPLANT
DEVICE SPIDERFX EMB PROT 6MM (WIRE) ×1 IMPLANT
GUIDEWIRE ZILIENT 6G 014 (WIRE) ×1 IMPLANT
KIT PV (KITS) ×3 IMPLANT
SHEATH HIGHFLEX ANSEL 7FR 55CM (SHEATH) ×1 IMPLANT
SHEATH PINNACLE 5F 10CM (SHEATH) ×1 IMPLANT
SHEATH PINNACLE 7F 10CM (SHEATH) ×1 IMPLANT
SHEATH PROBE COVER 6X72 (BAG) ×1 IMPLANT
SYR MEDRAD MARK 7 150ML (SYRINGE) ×3 IMPLANT
TAPE VIPERTRACK RADIOPAQ (MISCELLANEOUS) IMPLANT
TAPE VIPERTRACK RADIOPAQUE (MISCELLANEOUS) ×3
TRANSDUCER W/STOPCOCK (MISCELLANEOUS) ×3 IMPLANT
TRAY PV CATH (CUSTOM PROCEDURE TRAY) ×3 IMPLANT
WIRE HITORQ VERSACORE ST 145CM (WIRE) ×1 IMPLANT
WIRE ROSEN-J .035X180CM (WIRE) ×1 IMPLANT

## 2019-08-08 NOTE — Progress Notes (Signed)
Discharge instructions reviewed with patient and daughter. Verbalized understanding.  

## 2019-08-08 NOTE — Discharge Instructions (Signed)
Femoral Site Care This sheet gives you information about how to care for yourself after your procedure. Your health care provider may also give you more specific instructions. If you have problems or questions, contact your health care provider. What can I expect after the procedure? After the procedure, it is common to have:  Bruising that usually fades within 1-2 weeks.  Tenderness at the site. Follow these instructions at home: Wound care  Follow instructions from your health care provider about how to take care of your insertion site. Make sure you: ? Wash your hands with soap and water before you change your bandage (dressing). If soap and water are not available, use hand sanitizer. ? Change your dressing as told by your health care provider. ? Leave stitches (sutures), skin glue, or adhesive strips in place. These skin closures may need to stay in place for 2 weeks or longer. If adhesive strip edges start to loosen and curl up, you may trim the loose edges. Do not remove adhesive strips completely unless your health care provider tells you to do that.  Do not take baths, swim, or use a hot tub until your health care provider approves.  You may shower 24-48 hours after the procedure or as told by your health care provider. ? Gently wash the site with plain soap and water. ? Pat the area dry with a clean towel. ? Do not rub the site. This may cause bleeding.  Do not apply powder or lotion to the site. Keep the site clean and dry.  Check your femoral site every day for signs of infection. Check for: ? Redness, swelling, or pain. ? Fluid or blood. ? Warmth. ? Pus or a bad smell. Activity  For the first 2-3 days after your procedure, or as long as directed: ? Avoid climbing stairs as much as possible. ? Do not squat.  Do not lift anything that is heavier than 10 lb (4.5 kg), or the limit that you are told, until your health care provider says that it is safe.  Rest as  directed. ? Avoid sitting for a long time without moving. Get up to take short walks every 1-2 hours.  Do not drive for 24 hours if you were given a medicine to help you relax (sedative). General instructions  Take over-the-counter and prescription medicines only as told by your health care provider.  Keep all follow-up visits as told by your health care provider. This is important. Contact a health care provider if you have:  A fever or chills.  You have redness, swelling, or pain around your insertion site. Get help right away if:  The catheter insertion area swells very fast.  You pass out.  You suddenly start to sweat or your skin gets clammy.  The catheter insertion area is bleeding, and the bleeding does not stop when you hold steady pressure on the area.  The area near or just beyond the catheter insertion site becomes pale, cool, tingly, or numb. These symptoms may represent a serious problem that is an emergency. Do not wait to see if the symptoms will go away. Get medical help right away. Call your local emergency services (911 in the [place] Do not drive yourself to the hospital. Summary  After the procedure, it is common to have bruising that usually fades within 1-2 weeks.  Check your femoral site every day for signs of infection.  Do not lift anything that is heavier than 10 lb (4.5 kg), or the   limit that you are told, until your health care provider says that it is safe. This information is not intended to replace advice given to you by your health care provider. Make sure you discuss any questions you have with your health care provider. Document Revised: 03/20/2017 Document Reviewed: 03/20/2017 Elsevier Patient Education  2020 [employer] Inc.  

## 2019-08-08 NOTE — Interval H&P Note (Signed)
History and Physical Interval Note:  08/08/2019 11:44 AM  Caleb [name]  has presented today for surgery, with the diagnosis of pad.  The various methods of treatment have been discussed with the patient and family. After consideration of risks, benefits and other options for treatment, the patient has consented to  Procedure(s): ABDOMINAL AORTOGRAM W/LOWER EXTREMITY (N/A) as a surgical intervention.  The patient's history has been reviewed, patient examined, no change in status, stable for surgery.  I have reviewed the patient's chart and labs.  Questions were answered to the patient's satisfaction.     Quay Burow

## 2019-08-09 MED FILL — Lidocaine HCl Local Preservative Free (PF) Inj 1%: INTRAMUSCULAR | Qty: 30 | Status: AC

## 2019-08-09 MED FILL — Hydralazine HCl Inj 20 MG/ML: INTRAMUSCULAR | Qty: 1 | Status: AC

## 2019-08-09 MED FILL — Clopidogrel Bisulfate Tab 300 MG (Base Equiv): ORAL | Qty: 1 | Status: AC

## 2019-08-13 ENCOUNTER — Other Ambulatory Visit: Payer: Self-pay

## 2019-08-13 ENCOUNTER — Ambulatory Visit (INDEPENDENT_AMBULATORY_CARE_PROVIDER_SITE_OTHER): Payer: Medicare HMO | Admitting: Podiatry

## 2019-08-13 ENCOUNTER — Encounter: Payer: Self-pay | Admitting: Podiatry

## 2019-08-13 DIAGNOSIS — I70229 Atherosclerosis of native arteries of extremities with rest pain, unspecified extremity: Secondary | ICD-10-CM

## 2019-08-13 DIAGNOSIS — L97502 Non-pressure chronic ulcer of other part of unspecified foot with fat layer exposed: Secondary | ICD-10-CM | POA: Diagnosis not present

## 2019-08-13 DIAGNOSIS — I739 Peripheral vascular disease, unspecified: Secondary | ICD-10-CM

## 2019-08-13 DIAGNOSIS — E11621 Type 2 diabetes mellitus with foot ulcer: Secondary | ICD-10-CM | POA: Diagnosis not present

## 2019-08-13 DIAGNOSIS — E1142 Type 2 diabetes mellitus with diabetic polyneuropathy: Secondary | ICD-10-CM

## 2019-08-13 DIAGNOSIS — I998 Other disorder of circulatory system: Secondary | ICD-10-CM | POA: Diagnosis not present

## 2019-08-13 NOTE — Patient Instructions (Signed)
Diabetes Mellitus and Foot Care Foot care is an important part of your health, especially when you have diabetes. Diabetes may cause you to have problems because of poor blood flow (circulation) to your feet and legs, which can cause your skin to:  Become thinner and drier.  Break more easily.  Heal more slowly.  Peel and crack. You may also have nerve damage (neuropathy) in your legs and feet, causing decreased feeling in them. This means that you may not notice minor injuries to your feet that could lead to more serious problems. Noticing and addressing any potential problems early is the best way to prevent future foot problems. How to care for your feet Foot hygiene  Wash your feet daily with warm water and mild soap. Do not use hot water. Then, pat your feet and the areas between your toes until they are completely dry. Do not soak your feet as this can dry your skin.  Trim your toenails straight across. Do not dig under them or around the cuticle. File the edges of your nails with an emery board or nail file.  Apply a moisturizing lotion or petroleum jelly to the skin on your feet and to dry, brittle toenails. Use lotion that does not contain alcohol and is unscented. Do not apply lotion between your toes. Shoes and socks  Wear clean socks or stockings every day. Make sure they are not too tight. Do not wear knee-high stockings since they may decrease blood flow to your legs.  Wear shoes that fit properly and have enough cushioning. Always look in your shoes before you put them on to be sure there are no objects inside.  To break in new shoes, wear them for just a few hours a day. This prevents injuries on your feet. Wounds, scrapes, corns, and calluses  Check your feet daily for blisters, cuts, bruises, sores, and redness. If you cannot see the bottom of your feet, use a mirror or ask someone for help.  Do not cut corns or calluses or try to remove them with medicine.  If you  find a minor scrape, cut, or break in the skin on your feet, keep it and the skin around it clean and dry. You may clean these areas with mild soap and water. Do not clean the area with peroxide, alcohol, or iodine.  If you have a wound, scrape, corn, or callus on your foot, look at it several times a day to make sure it is healing and not infected. Check for: ? Redness, swelling, or pain. ? Fluid or blood. ? Warmth. ? Pus or a bad smell. General instructions  Do not cross your legs. This may decrease blood flow to your feet.  Do not use heating pads or hot water bottles on your feet. They may burn your skin. If you have lost feeling in your feet or legs, you may not know this is happening until it is too late.  Protect your feet from hot and cold by wearing shoes, such as at the beach or on hot pavement.  Schedule a complete foot exam at least once a year (annually) or more often if you have foot problems. If you have foot problems, report any cuts, sores, or bruises to your health care provider immediately. Contact a health care provider if:  You have a medical condition that increases your risk of infection and you have any cuts, sores, or bruises on your feet.  You have an injury that is not   healing.  You have redness on your legs or feet.  You feel burning or tingling in your legs or feet.  You have pain or cramps in your legs and feet.  Your legs or feet are numb.  Your feet always feel cold.  You have pain around a toenail. Get help right away if:  You have a wound, scrape, corn, or callus on your foot and: ? You have pain, swelling, or redness that gets worse. ? You have fluid or blood coming from the wound, scrape, corn, or callus. ? Your wound, scrape, corn, or callus feels warm to the touch. ? You have pus or a bad smell coming from the wound, scrape, corn, or callus. ? You have a fever. ? You have a red line going up your leg. Summary  Check your feet every day  for cuts, sores, red spots, swelling, and blisters.  Moisturize feet and legs daily.  Wear shoes that fit properly and have enough cushioning.  If you have foot problems, report any cuts, sores, or bruises to your health care provider immediately.  Schedule a complete foot exam at least once a year (annually) or more often if you have foot problems. This information is not intended to replace advice given to you by your health care provider. Make sure you discuss any questions you have with your health care provider. Document Revised: 11/28/2018 Document Reviewed: 04/08/2016 Elsevier Patient Education  2020 [employer]  

## 2019-08-16 ENCOUNTER — Ambulatory Visit (HOSPITAL_COMMUNITY)
Admission: RE | Admit: 2019-08-16 | Discharge: 2019-08-16 | Disposition: A | Discharge status: Home / Self Care | Payer: Medicare HMO | Source: Ambulatory Visit | Attending: Cardiovascular Disease | Admitting: Cardiovascular Disease

## 2019-08-16 ENCOUNTER — Other Ambulatory Visit: Payer: Self-pay

## 2019-08-16 ENCOUNTER — Telehealth: Payer: Self-pay

## 2019-08-16 ENCOUNTER — Other Ambulatory Visit (HOSPITAL_COMMUNITY): Payer: Self-pay | Admitting: Cardiovascular Disease

## 2019-08-16 DIAGNOSIS — S91109D Unspecified open wound of unspecified toe(s) without damage to nail, subsequent encounter: Secondary | ICD-10-CM | POA: Insufficient documentation

## 2019-08-16 DIAGNOSIS — Z959 Presence of cardiac and vascular implant and graft, unspecified: Secondary | ICD-10-CM | POA: Diagnosis not present

## 2019-08-16 DIAGNOSIS — I739 Peripheral vascular disease, unspecified: Secondary | ICD-10-CM

## 2019-08-16 DIAGNOSIS — I998 Other disorder of circulatory system: Secondary | ICD-10-CM | POA: Diagnosis not present

## 2019-08-16 DIAGNOSIS — Z9889 Other specified postprocedural states: Secondary | ICD-10-CM

## 2019-08-16 NOTE — Telephone Encounter (Signed)
Spoke to patient lower ext arterial doppler results given.Advised to repeat in 6 months. 

## 2019-08-19 NOTE — Progress Notes (Signed)
Subjective:  Patient ID: Caleb [name], male    DOB: [birth year],  MRN: [id number]  69 y.o. male is seen for follow up of multiple digits b/l.   Wound Care regimen: Silvadene Cream to digits 1-3 left, right hallux, and right 2nd digit. Santyl Ointment to left 4th digit.  Hx confirmed with patient.  Wound location: L hallux, L 2nd toe, L 3rd toe, L 4th toe, R hallux and R 2nd toe.  Patient denies fever, chills, night sweats, nausea, or vomiting.  He has had vascular intervention by Dr. Lorie Phenix:  directional atherectomy with drug coated balloon on the right LE. Left LE intervention to be approached in a staged procedure.  He states he took a shower and was able to keep both feet dry.  Past Medical History:  Diagnosis Date  . Diabetes mellitus without complication ([place])   . Dyslipidemia 09/27/2012  . Erectile dysfunction 09/27/2012  . Heart murmur   . Hypercholesteremia   . Hyperlipidemia 08/12/2012  . Hypertension   . Hyponatremia 08/14/2012  . Neuropathy   . Pancreatitis   . Pancreatitis, acute 09/27/2012  . Vitamin D deficiency 06/23/2015     Past Surgical History:  Procedure Laterality Date  . ABDOMINAL AORTOGRAM W/LOWER EXTREMITY N/A 08/08/2019   Procedure: ABDOMINAL AORTOGRAM W/LOWER EXTREMITY;  Surgeon: Lorretta Harp, MD;  Location: [facility];  Service: Cardiovascular;  Laterality: N/A;  . COLONOSCOPY  12 years ago   in [place] clinic= normal exam per pt  . KNEE SURGERY    . PERIPHERAL VASCULAR INTERVENTION Right 08/08/2019   Procedure: PERIPHERAL VASCULAR INTERVENTION;  Surgeon: Lorretta Harp, MD;  Location: [facility];  Service: Cardiovascular;  Laterality: Right;     Current Outpatient Medications on File Prior to Visit  Medication Sig Dispense Refill  . aspirin EC 81 MG tablet Take 81 mg by mouth daily.    . Blood Glucose Monitoring Suppl (ONETOUCH VERIO) w/Device KIT 1 each by Does not apply route 2 (two) times daily. Use as instructed twice  daily, check fasting sugar in morning and check before bedtime. E11.8 1 kit 0  . clopidogrel (PLAVIX) 75 MG tablet Take 1 tablet (75 mg total) by mouth daily. 30 tablet 11  . collagenase (SANTYL) ointment Apply 1 application topically daily. Apply Santyl Ointment to scab on left 3rd and 4th digits. Measurements: Left 3rd toe 1.0 x 0.6 cm Left 4th toe: 2.0 x 1.0 cm (Patient taking differently: Apply 1 application topically daily as needed (Scab). Apply Santyl Ointment to scab on left 3rd and 4th digits. Measurements: Left 3rd toe 1.0 x 0.6 cm Left 4th toe: 2.0 x 1.0 cm) 30 g 0  . Continuous Blood Gluc Receiver (FREESTYLE LIBRE 14 DAY READER) DEVI 1 each by Does not apply route See admin instructions. Use as directed to check blood glucose 1 Device 0  . Continuous Blood Gluc Sensor (FREESTYLE LIBRE 14 DAY SENSOR) MISC INJECT 1 EACH INTO THE SKIN EVERY 14 (FOURTEEN) DAYS. E11.69 2 each 2  . gabapentin (NEURONTIN) 300 MG capsule Take 1 capsule (300 mg total) by mouth 2 (two) times daily. (Patient taking differently: Take 300 mg by mouth daily as needed (pain). ) 180 capsule 1  . glucose blood test strip Use as instructed twice daily, check fasting sugar in morning and check before bedtime. E11.8 100 each 12  . HUMULIN 70/30 (70-30) 100 UNIT/ML injection INJECT 37 UNITS INTO THE SKIN 2 TIMES DAILY WITH A MEAL. (Patient taking  differently: Inject 37 Units into the skin 2 (two) times daily as needed (high blood sugar). Under 110 do not take) 60 mL 6  . lisinopril-hydrochlorothiazide (ZESTORETIC) 20-12.5 MG tablet Take 2 tablets by mouth daily. 180 tablet 1  . metFORMIN (GLUCOPHAGE) 1000 MG tablet Take 1 tablet (1,000 mg total) by mouth 2 (two) times daily with a meal. (Patient taking differently: Take 1,000 mg by mouth daily. ) 360 tablet 1  . metoprolol tartrate (LOPRESSOR) 100 MG tablet Take 1 tablet (100 mg total) by mouth 2 (two) times daily. 180 tablet 1  . ONETOUCH DELICA LANCETS 33G MISC Use as instructed  twice daily, check fasting sugar in morning and check before bedtime. E11.8 100 each 12  . rosuvastatin (CRESTOR) 40 MG tablet Take 1 tablet (40 mg total) by mouth daily. To lower cholesterol 90 tablet 1  . sildenafil (VIAGRA) 50 MG tablet TAKE 2 TABLETS BY MOUTH DAILY AS NEEDED FOR ERECTILE DYSFUNCTION (Patient taking differently: Take 50-100 mg by mouth as needed for erectile dysfunction. ) 20 tablet 1  . silver sulfADIAZINE (SILVADENE) 1 % cream Apply to affected toes once daily and cover with dressing. (Patient taking differently: Apply 1 application topically See admin instructions. Apply to affected toes once daily and cover with dressing.) 400 g 0  . Vitamin D, Ergocalciferol, (DRISDOL) 50000 units CAPS capsule Take 1 capsule (50,000 Units total) by mouth every 7 (seven) days. 12 capsule 0   Current Facility-Administered Medications on File Prior to Visit  Medication Dose Route Frequency Provider Last Rate Last Admin  . sodium chloride flush (NS) 0.9 % injection 3 mL  3 mL Intravenous Q12H Berry, Jonathan J, MD         No Known Allergies   Family History  Problem Relation Age of Onset  . CAD Father   . Hypertension Father   . Alcohol abuse Father        Cause of death  . Diabetes Mother   . Colon polyps Mother   . CAD Brother 50       CABG  . Colon cancer Neg Hx   . Esophageal cancer Neg Hx   . Rectal cancer Neg Hx   . Stomach cancer Neg Hx      Social History   Occupational History  . Occupation: Caregiver    Comment: Special needs  Tobacco Use  . Smoking status: Former Smoker    Types: Cigarettes  . Smokeless tobacco: Never Used  Substance and Sexual Activity  . Alcohol use: Not Currently    Comment: rare  . Drug use: No  . Sexual activity: Not Currently     Immunization History  Administered Date(s) Administered  . Pneumococcal Conjugate-13 09/13/2016  . Pneumococcal Polysaccharide-23 08/15/2012, 01/14/2019  . Tdap 06/06/2016     Review of systems:  Positive Findings in bold print.  Constitutional:  chills, fatigue, fever, sweats, weight change Communication: translator, sign language translator, hand writing, iPad/Android device Head: head injury Eyes: changes in vision, eye pain, glaucoma, cataracts, macular degeneration, diplopia, glare, light sensitivity, eyeglasses or contacts, blindness Ears nose mouth throat: hearing impaired, hearing aids,  ringing in ears, deaf, sign language,  vertigo, nosebleeds,  rhinitis,  cold sores, snoring, swollen glands Cardiovascular: HTN, edema, arrhythmia, pacemaker in place, defibrillator in place, chest pain/tightness, chronic anticoagulation, blood clot, heart failure, MI Peripheral Vascular: leg cramps, varicose veins, blood clots, lymphedema, varicosities Respiratory:  asthma, difficulty breathing, denies congestion, SOB, wheezing, cough, emphysema, oxygen dependent Gastrointestinal: change in appetite   or weight, abdominal pain, constipation, diarrhea, nausea, vomiting, vomiting blood, change in bowel habits, abdominal pain, jaundice, rectal bleeding, hemorrhoids, GERD Genitourinary:  nocturia,  pain on urination, polyuria,  blood in urine, Foley catheter, urinary urgency, ESRD on hemodialysis Musculoskeletal: amputation(s), cramping, stiff joints, painful joints, decreased joint motion, fractures, OA, gout, hemiplegia, paraplegia, uses cane, wheelchair bound, uses walker, uses rollator Skin: changes in toenails, color change, dryness, itching, mole changes,  rash, wound(s) Neurological: headaches, numbness in feet, paresthesias in feet, burning in feet, fainting,  seizures, change in speech. denies headaches, memory problems/poor historian, cerebral palsy, weakness, paralysis, CVA, TIA, tremors Endocrine: diabetes, hypothyroidism, hyperthyroidism,  goiter, dry mouth, flushing, heat intolerance,  cold intolerance,  excessive thirst, denies polyuria,  nocturia Hematological:  easy bleeding, excessive  bleeding, easy bruising, enlarged lymph nodes, on long term blood thinner, history of past transusions Allergy/immunological:  hives, eczema, frequent infections, multiple drug allergies, seasonal allergies, transplant recipient, multiple food allergies Psychiatric:  anxiety, depression, mood disorder, suicidal ideations, hallucinations, insomnia   Objective:  Physical Exam: There were no vitals filed for this visit.   69 y.o. pleasant AA male, well developed, well nourished in NAD. AAO x 3.  Capillary refill time to digits <4 seconds b/l. Palpable DP pulses b/l. Palpable PT pulses b/l. Pedal hair absent b/l Skin temperature gradient within normal limits b/l. No edema noted b/l.  Pedal skin with normal turgor, texture and tone bilaterally. No open wounds bilaterally. No interdigital macerations bilaterally.      Wound Location: L hallux There is moderate amount of devitalized hyperkeratosis present in the wound. Predebridement Wound Measurement: 1.0 x 2.0 x 0.2 cm. Postdebridement Wound Measurement: 1.2 x 2.0 x 0.2 cm. Wound Base: Mixed Granular/Fibrotic Peri-wound: Calloused Exudate: None: wound tissue dry Blood Loss during debridement: <1 cc. Description of tissue removed from ulceration today:  hyperkeratotic. Signs of clinical bacterial infection are absent. Material in wound which inhibits healing/promotes adjacent tissue breakdown: devitalized hyperkeratosis.  Wound Location: L 2nd toe There is moderate amount of devitalized hyperkeratosis present in the wound. Predebridement Wound Measurement: 1.0 x 0.8 x 0.2 cm.  Postdebridement Wound Measurement:1.0 x 1.5 x 0.2 cm. Wound Base: Granular/Healthy Peri-wound: Calloused Exudate: None: wound tissue dry Blood Loss during debridement: <1 cc's. Description of tissue removed from ulceration today: Devitalized hyperkeratosis.. Signs of clinical bacterial infection are absent. Material in wound which inhibits healing/promotes  adjacent tissue breakdown:  Devitalized hyperkeratosis.  Wound Location: L 3rd toe There is moderate amount devitalized hyperkeratotic tissue present in the wound. Predebridement Wound Measurement: 1.0 x 1.1 x 0.2 cm Postdebridement Wound Measurement: 1.0 x 1.4 x 0.2 cm  Wound Base: Granular/Healthy Peri-wound: Calloused Exudate: None: wound tissue dry Blood Loss during debridement: <1 cc's. Description of tissue removed from ulceration today: devitalized hyperkeratosis. Signs of clinical bacterial infection are absent. Material in wound which inhibits healing/promotes adjacent tissue breakdown: devitalied hyperkeratosis.  Wound Location: L 4th toe There is moderate amount of necrotic eschar present in the wound. Predebridement Wound Measurement: 1.2 x 0.8 cm eschar  Postdebridement Wound Measurement: 1.2 x 1.6 x 0.2 cm  Wound Base: Necrotic eschar Peri-wound: Calloused Exudate: None: wound tissue dry Blood Loss during debridement: <2 cc cc's. Description of tissue removed from ulceration today:  Necrotic eschar and devitalized hyperkeratosis. Signs of clinical bacterial infection are absent. Material in wound which inhibits healing/promotes adjacent tissue breakdown: necrotic eschar and devitalized hyperkeratosis..  Wound Location: R hallux There is moderate amount of devitalized hyperkeratosis present in the wound. Predebridement Wound   Measurement: 1.0x 1.0 x 0.2 cm. Postdebridement Wound Measurement: 1.1 x 1.1 x 0.2 cm Wound Base: Granular/Healthy Peri-wound: Calloused Exudate: None: wound tissue dry Blood Loss during debridement: <1 cc's. Description of tissue removed from ulceration today:  Devitalized hyperkeratosis. Signs of clinical bacterial infection are absent. Material in wound which inhibits healing/promotes adjacent tissue breakdown:  Devitalized hyperkeratosis.  Normal muscle strength 5/5 to all lower extremity muscle groups bilaterally. No pain crepitus or joint  limitation noted with ROM b/l. Hallux valgus with bunion deformity noted left foot and right foot. Tailor's bunion deformity noted 2-5 bilaterally.  Protective sensation diminished with 10g monofilament b/l. Vibratory sensation diminished b/l.  Hemoglobin A1C Latest Ref Rng & Units 01/16/2019  HGBA1C 0.0 - 7.0 % 7.6(A)  Some recent data might be hidden     Lab Results  Component Value Date   HGBA1C 7.6 (A) 01/16/2019      No results for input(s): GRAMSTAIN, LABORGA in the last 8760 hours.   Lab Results  Component Value Date   WBC 6.0 08/02/2019   HGB 15.0 08/08/2019   HCT 44.0 08/08/2019   MCV 86 08/02/2019   PLT 318 08/02/2019     Assessment:  No diagnosis found. Plan:  Patient was evaluated and treated and all questions answered.  Patient/POA/Family member educated on diagnosis and treatment plan of routine ulcer debridement/wound care.  Type/amount of devitalized tissue removed: see above All ulcers debrided to level of subcutaneous tissue. Today's ulcer size post-debridement: see above All wounds responded well to today's debridement. Patient risk factors affecting healing of ulcer: diabetes, PAD, neuropathy, digital deformities, dyslipidemia Frequency of debridement needed to achieve healing: weekly -Ulcer was debrided of nonviable necrotic tissue and was resected to the level of subcutaneous tissue utilizing sterile tissue nipper and sterile scalpel blade. Ulcers cleansed with wound cleanser. Silvadene Cream was applied to base of wound with light dressing.  -Continue keeping both feet dry. -Continue daily dressing changes to b/l hallux, left 2nd and left 3rd digit with Silvadene Cream. Continue Santyl Ointment to left 4th digit.  -Continue Darco shoe to left foot. -Patient/POA to call should there be question/concern in the interim.  Return in about 1 week (around 08/20/2019) for diabetic ulcer.  Marzetta Board, DPM

## 2019-08-20 ENCOUNTER — Encounter: Payer: Self-pay | Admitting: Podiatry

## 2019-08-20 ENCOUNTER — Ambulatory Visit (INDEPENDENT_AMBULATORY_CARE_PROVIDER_SITE_OTHER): Payer: Medicare HMO | Admitting: Podiatry

## 2019-08-20 ENCOUNTER — Other Ambulatory Visit: Payer: Self-pay | Admitting: Family Medicine

## 2019-08-20 ENCOUNTER — Other Ambulatory Visit: Payer: Self-pay

## 2019-08-20 DIAGNOSIS — E11621 Type 2 diabetes mellitus with foot ulcer: Secondary | ICD-10-CM

## 2019-08-20 DIAGNOSIS — L97501 Non-pressure chronic ulcer of other part of unspecified foot limited to breakdown of skin: Secondary | ICD-10-CM | POA: Diagnosis not present

## 2019-08-20 DIAGNOSIS — Z872 Personal history of diseases of the skin and subcutaneous tissue: Secondary | ICD-10-CM | POA: Diagnosis not present

## 2019-08-20 DIAGNOSIS — E1169 Type 2 diabetes mellitus with other specified complication: Secondary | ICD-10-CM

## 2019-08-20 DIAGNOSIS — Z794 Long term (current) use of insulin: Secondary | ICD-10-CM

## 2019-08-20 MED ORDER — SILVER SULFADIAZINE 1 % EX CREA: TOPICAL_CREAM | CUTANEOUS | 1 refills | Status: DC

## 2019-08-20 MED FILL — SSD 1% CREAM: 1 | 30 days supply | Qty: 400 | Fill #0

## 2019-08-21 ENCOUNTER — Telehealth: Payer: Self-pay | Admitting: Family Medicine

## 2019-08-21 NOTE — Telephone Encounter (Signed)
Patient came in saying that [facility] needs a letter saying that that he needs the [facility]. Patient was told that his PCP had already written a letter for him back in February and patient states that [facility] never received it. Please f/u.   [employer] phone: [phone]  Reference Number- [id number]

## 2019-08-23 ENCOUNTER — Other Ambulatory Visit: Payer: Self-pay

## 2019-08-23 ENCOUNTER — Encounter: Payer: Self-pay | Admitting: Cardiovascular Disease

## 2019-08-23 ENCOUNTER — Ambulatory Visit (INDEPENDENT_AMBULATORY_CARE_PROVIDER_SITE_OTHER): Payer: Medicare HMO | Admitting: Cardiovascular Disease

## 2019-08-23 VITALS — BP 110/80 | HR 78 | Ht 68.0 in | Wt 192.0 lb

## 2019-08-23 DIAGNOSIS — I739 Peripheral vascular disease, unspecified: Secondary | ICD-10-CM

## 2019-08-23 DIAGNOSIS — Z01810 Encounter for preprocedural cardiovascular examination: Secondary | ICD-10-CM

## 2019-08-23 DIAGNOSIS — I6523 Occlusion and stenosis of bilateral carotid arteries: Secondary | ICD-10-CM

## 2019-08-23 DIAGNOSIS — I779 Disorder of arteries and arterioles, unspecified: Secondary | ICD-10-CM | POA: Insufficient documentation

## 2019-08-23 DIAGNOSIS — R011 Cardiac murmur, unspecified: Secondary | ICD-10-CM | POA: Diagnosis not present

## 2019-08-23 NOTE — Assessment & Plan Note (Signed)
Recent carotid Dopplers performed 08/02/2019 revealing high-grade bilateral right greater than left ICA stenosis.  He has an appointment with vascular surgery next week.  I am going to get a Lexiscan Myoview stress test for preoperative clearance in anticipation of the need for endarterectomy.

## 2019-08-23 NOTE — Progress Notes (Signed)
08/23/2019 [name]   05/22/1950  [id number]  Primary Physician [name], MD Primary Cardiologist: Lorretta Harp MD [name], [place], [place]  HPI:  Caleb [name] is a 69 y.o.  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 Adah Perl, his podiatrist for evaluation of critical limb ischemia.  I last saw him in the office 08/02/2019. His wife of 45 years did die of ovarian cancer 3 years ago but he is very positive about meeting somebody new in his life.  He is very active and swims 4 days a week, walks 3 to 4 days a week and belongs to fitness.  He denies chest pain or shortness of breath.  His cardiac risk factors are notable for treated hypertension, diabetes and hyperlipidemia.  His father did die of a myocardial infarction age 35.  He is never had a heart attack or stroke.  He does complain of lifestyle and claudication however.  He developed wounds on his toes after walking 5 miles about [facility] with new shoes and has been treated by Dr. Adah Perl since that time with slow progression of wound healing.  I performed peripheral angiography on him 08/08/2019 and performed atherectomy and drug-coated balloon angioplasty of high-grade right SFA and popliteal stenosis with two-vessel runoff.  This resulted in an improvement in his claudication symptoms, healing of his right foot wounds and improvement in his Doppler studies.  He did have an occluded left popliteal artery which I will address in a staged fashion in the near future although his wounds on his left foot are slowly healing.  I also performed carotid Doppler studies that showed high-grade right greater than left ICA stenosis and have referred him to vascular surgery for consideration of endarterectomy.  He denies chest pain or shortness of breath.  He does have mild aortic stenosis by 2D echo 2 years  ago and a high-pitched outflow tract murmur.  We will check a 2D echo and a Lexiscan Myoview stress test to risk stratify him.    Current Meds  Medication Sig  . aspirin EC 81 MG tablet Take 81 mg by mouth daily.  . Blood Glucose Monitoring Suppl (ONETOUCH VERIO) w/Device KIT 1 each by Does not apply route 2 (two) times daily. Use as instructed twice daily, check fasting sugar in morning and check before bedtime. E11.8  . clopidogrel (PLAVIX) 75 MG tablet Take 1 tablet (75 mg total) by mouth daily.  . collagenase (SANTYL) ointment Apply 1 application topically daily. Apply Santyl Ointment to scab on left 3rd and 4th digits. Measurements: Left 3rd toe 1.0 x 0.6 cm Left 4th toe: 2.0 x 1.0 cm (Patient taking differently: Apply 1 application topically daily as needed (Scab). Apply Santyl Ointment to scab on left 3rd and 4th digits. Measurements: Left 3rd toe 1.0 x 0.6 cm Left 4th toe: 2.0 x 1.0 cm)  . Continuous Blood Gluc Receiver (FREESTYLE LIBRE 14 DAY READER) DEVI 1 each by Does not apply route See admin instructions. Use as directed to check blood glucose  . Continuous Blood Gluc Sensor (FREESTYLE LIBRE 14 DAY SENSOR) MISC INJECT IN THE SKIN EVERY 14 (FOURTEEN) DAYS. E11.69  . gabapentin (NEURONTIN) 300 MG capsule Take 1 capsule (300 mg total) by mouth 2 (two) times daily. (Patient taking differently: Take 300 mg by mouth daily as needed (pain). )  . glucose blood test strip  Use as instructed twice daily, check fasting sugar in morning and check before bedtime. E11.8  . HUMULIN 70/30 (70-30) 100 UNIT/ML injection INJECT 37 UNITS INTO THE SKIN 2 TIMES DAILY WITH A MEAL. (Patient taking differently: Inject 37 Units into the skin 2 (two) times daily as needed (high blood sugar). Under 110 do not take)  . lisinopril-hydrochlorothiazide (ZESTORETIC) 20-12.5 MG tablet Take 2 tablets by mouth daily.  . metFORMIN (GLUCOPHAGE) 1000 MG tablet Take 1 tablet (1,000 mg total) by mouth 2 (two) times daily with a  meal. (Patient taking differently: Take 1,000 mg by mouth daily. )  . metoprolol tartrate (LOPRESSOR) 100 MG tablet Take 1 tablet (100 mg total) by mouth 2 (two) times daily.  Glory Rosebush DELICA LANCETS 50T MISC Use as instructed twice daily, check fasting sugar in morning and check before bedtime. E11.8  . rosuvastatin (CRESTOR) 40 MG tablet Take 1 tablet (40 mg total) by mouth daily. To lower cholesterol  . sildenafil (VIAGRA) 50 MG tablet TAKE 2 TABLETS BY MOUTH DAILY AS NEEDED FOR ERECTILE DYSFUNCTION (Patient taking differently: Take 50-100 mg by mouth as needed for erectile dysfunction. )  . silver sulfADIAZINE (SILVADENE) 1 % cream Apply to affected toes once daily and cover with dressing.  . Vitamin D, Ergocalciferol, (DRISDOL) 50000 units CAPS capsule Take 1 capsule (50,000 Units total) by mouth every 7 (seven) days.   Current Facility-Administered Medications for the 08/23/19 encounter (Office Visit) with Lorretta Harp, MD  Medication  . sodium chloride flush (NS) 0.9 % injection 3 mL     No Known Allergies  Social History   Socioeconomic History  . Marital status: Widowed    Spouse name: Not on file  . Number of children: Not on file  . Years of education: Not on file  . Highest education level: Not on file  Occupational History  . Occupation: Caregiver    Comment: Special needs  Tobacco Use  . Smoking status: Former Smoker    Types: Cigarettes  . Smokeless tobacco: Never Used  Substance and Sexual Activity  . Alcohol use: Not Currently    Comment: rare  . Drug use: No  . Sexual activity: Not Currently  Other Topics Concern  . Not on file  Social History Narrative   Widower.  3 daughters and 3 grandchildren.     Social Determinants of Health   Financial Resource Strain:   . Difficulty of Paying Living Expenses:   Food Insecurity:   . Worried About Charity fundraiser in the Last Year:   . Arboriculturist in the Last Year:   Transportation Needs:   . Lexicographer (Medical):   Marland Kitchen Lack of Transportation (Non-Medical):   Physical Activity:   . Days of Exercise per Week:   . Minutes of Exercise per Session:   Stress:   . Feeling of Stress :   Social Connections:   . Frequency of Communication with Friends and Family:   . Frequency of Social Gatherings with Friends and Family:   . Attends Religious Services:   . Active Member of Clubs or Organizations:   . Attends Archivist Meetings:   Marland Kitchen Marital Status:   Intimate Partner Violence:   . Fear of Current or Ex-Partner:   . Emotionally Abused:   Marland Kitchen Physically Abused:   . Sexually Abused:      Review of Systems: General: negative for chills, fever, night sweats or weight changes.  Cardiovascular: negative for  chest pain, dyspnea on exertion, edema, orthopnea, palpitations, paroxysmal nocturnal dyspnea or shortness of breath Dermatological: negative for rash Respiratory: negative for cough or wheezing Urologic: negative for hematuria Abdominal: negative for nausea, vomiting, diarrhea, bright red blood per rectum, melena, or hematemesis Neurologic: negative for visual changes, syncope, or dizziness All other systems reviewed and are otherwise negative except as noted above.    Blood pressure 110/80, pulse 78, height '5\' 8"'  (1.727 m), weight 192 lb (87.1 kg), SpO2 99 %.  General appearance: alert and no distress Neck: no adenopathy, no JVD, supple, symmetrical, trachea midline, thyroid not enlarged, symmetric, no tenderness/mass/nodules and Bilateral carotid bruits Lungs: clear to auscultation bilaterally Abdomen: soft, non-tender; bowel sounds normal; no masses,  no organomegaly Extremities: extremities normal, atraumatic, no cyanosis or edema Pulses: Diminished pedal pulses Skin: Skin color, texture, turgor normal. No rashes or lesions Neurologic: Alert and oriented X 3, normal strength and tone. Normal symmetric reflexes. Normal coordination and gait  EKG not performed  today  ASSESSMENT AND PLAN:   Cardiac murmur History of mild aortic stenosis in the past by echo 12/19.  I am to repeat a 2D echocardiogram.  Critical limb ischemia with history of revascularization of same extremity History of critical limb ischemia status post recent right SFA intervention by myself 08/08/2019 with improvement in his Dopplers and healing of his right foot wounds.  His left foot wounds continue to heal slowly.  He does have an occluded left popliteal artery as well and has an occluded posterior tibial and peroneal artery with high-grade proximal segmental anterior tibial artery stenosis.  He does have some mild left calf claudication and wishes to proceed with left popliteal intervention.  Carotid artery disease ([place]) Recent carotid Dopplers performed 08/02/2019 revealing high-grade bilateral right greater than left ICA stenosis.  He has an appointment with vascular surgery next week.  I am going to get a Lexiscan Myoview stress test for preoperative clearance in anticipation of the need for endarterectomy.      Lorretta Harp MD FACP,FACC,FAHA, [facility] 08/23/2019 9:42 AM

## 2019-08-23 NOTE — Telephone Encounter (Signed)
Letter has been faxed over to [facility]

## 2019-08-23 NOTE — Assessment & Plan Note (Signed)
History of mild aortic stenosis in the past by echo 12/19.  I am to repeat a 2D echocardiogram.

## 2019-08-23 NOTE — Assessment & Plan Note (Signed)
History of critical limb ischemia status post recent right SFA intervention by myself 08/08/2019 with improvement in his Dopplers and healing of his right foot wounds.  His left foot wounds continue to heal slowly.  He does have an occluded left popliteal artery as well and has an occluded posterior tibial and peroneal artery with high-grade proximal segmental anterior tibial artery stenosis.  He does have some mild left calf claudication and wishes to proceed with left popliteal intervention.

## 2019-08-23 NOTE — Patient Instructions (Signed)
Medication Instructions:  Your physician recommends that you continue on your current medications as directed. Please refer to the Current Medication list given to you today.  *If you need a refill on your cardiac medications before your next appointment, please call your pharmacy*   Lab Work: NONE If you have labs (blood work) drawn today and your tests are completely normal, you will receive your results only by: Marland Kitchen MyChart Message (if you have MyChart) OR . A paper copy in the mail If you have any lab test that is abnormal or we need to change your treatment, we will call you to review the results.   Testing/Procedures: Echo on 6/8   Lexiscan Myoview stress test to be scheduled   Follow-Up: At [facility], you and your health needs are our priority.  As part of our continuing mission to provide you with exceptional heart care, we have created designated Provider Care Teams.  These Care Teams include your primary Cardiologist (physician) and Advanced Practice Providers (APPs -  Physician Assistants and Nurse Practitioners) who all work together to provide you with the care you need, when you need it.  We recommend signing up for the patient portal called "MyChart".  Sign up information is provided on this After Visit Summary.  MyChart is used to connect with patients for Virtual Visits (Telemedicine).  Patients are able to view lab/test results, encounter notes, upcoming appointments, etc.  Non-urgent messages can be sent to your provider as well.   To learn more about what you can do with MyChart, go to [email or web address].    Your next appointment:   3 month(s)  The format for your next appointment:   In Person  Provider:   Dr. Gwenlyn Found   Other Instructions

## 2019-08-25 NOTE — Progress Notes (Signed)
Subjective:  Patient ID: Caleb [name], male    DOB: 11/22/1950,  MRN: [id number]  69 y.o. male is seen for follow up ulcer L hallux, L 2nd toe, L 3rd toe, L 4th toe, L 5th toe and R hallux.  Prescribed wound care regimen: Silvadene cream dressing once daily to b/l hallux, left 2nd, left 3rd toes and Santyl Ointment to left 4th digit.  Offloading achieved with Darco shoe left foot.  Patient relates wounds are better than previous visit.  Hx confirmed with Caleb [name]. He is requesting refill of Silvadene Cream.  Patient denies fever, chills, night sweats, nausea, or vomiting.  Past Medical History:  Diagnosis Date  . Diabetes mellitus without complication ([place])   . Dyslipidemia 09/27/2012  . Erectile dysfunction 09/27/2012  . Heart murmur   . Hypercholesteremia   . Hyperlipidemia 08/12/2012  . Hypertension   . Hyponatremia 08/14/2012  . Neuropathy   . Pancreatitis   . Pancreatitis, acute 09/27/2012  . Vitamin D deficiency 06/23/2015     Past Surgical History:  Procedure Laterality Date  . ABDOMINAL AORTOGRAM W/LOWER EXTREMITY N/A 08/08/2019   Procedure: ABDOMINAL AORTOGRAM W/LOWER EXTREMITY;  Surgeon: Lorretta Harp, MD;  Location: [facility];  Service: Cardiovascular;  Laterality: N/A;  . COLONOSCOPY  12 years ago   in [place] clinic= normal exam per pt  . KNEE SURGERY    . PERIPHERAL VASCULAR INTERVENTION Right 08/08/2019   Procedure: PERIPHERAL VASCULAR INTERVENTION;  Surgeon: Lorretta Harp, MD;  Location: [facility];  Service: Cardiovascular;  Laterality: Right;     Current Outpatient Medications on File Prior to Visit  Medication Sig Dispense Refill  . aspirin EC 81 MG tablet Take 81 mg by mouth daily.    . Blood Glucose Monitoring Suppl (ONETOUCH VERIO) w/Device KIT 1 each by Does not apply route 2 (two) times daily. Use as instructed twice daily, check fasting sugar in morning and check before bedtime. E11.8 1 kit 0  . clopidogrel (PLAVIX) 75 MG tablet  Take 1 tablet (75 mg total) by mouth daily. 30 tablet 11  . collagenase (SANTYL) ointment Apply 1 application topically daily. Apply Santyl Ointment to scab on left 3rd and 4th digits. Measurements: Left 3rd toe 1.0 x 0.6 cm Left 4th toe: 2.0 x 1.0 cm (Patient taking differently: Apply 1 application topically daily as needed (Scab). Apply Santyl Ointment to scab on left 3rd and 4th digits. Measurements: Left 3rd toe 1.0 x 0.6 cm Left 4th toe: 2.0 x 1.0 cm) 30 g 0  . Continuous Blood Gluc Receiver (FREESTYLE LIBRE 14 DAY READER) DEVI 1 each by Does not apply route See admin instructions. Use as directed to check blood glucose 1 Device 0  . gabapentin (NEURONTIN) 300 MG capsule Take 1 capsule (300 mg total) by mouth 2 (two) times daily. (Patient taking differently: Take 300 mg by mouth daily as needed (pain). ) 180 capsule 1  . glucose blood test strip Use as instructed twice daily, check fasting sugar in morning and check before bedtime. E11.8 100 each 12  . HUMULIN 70/30 (70-30) 100 UNIT/ML injection INJECT 37 UNITS INTO THE SKIN 2 TIMES DAILY WITH A MEAL. (Patient taking differently: Inject 37 Units into the skin 2 (two) times daily as needed (high blood sugar). Under 110 do not take) 60 mL 6  . lisinopril-hydrochlorothiazide (ZESTORETIC) 20-12.5 MG tablet Take 2 tablets by mouth daily. 180 tablet 1  . metFORMIN (GLUCOPHAGE) 1000 MG tablet Take 1 tablet (1,000  mg total) by mouth 2 (two) times daily with a meal. (Patient taking differently: Take 1,000 mg by mouth daily. ) 360 tablet 1  . metoprolol tartrate (LOPRESSOR) 100 MG tablet Take 1 tablet (100 mg total) by mouth 2 (two) times daily. 180 tablet 1  . ONETOUCH DELICA LANCETS 02I MISC Use as instructed twice daily, check fasting sugar in morning and check before bedtime. E11.8 100 each 12  . rosuvastatin (CRESTOR) 40 MG tablet Take 1 tablet (40 mg total) by mouth daily. To lower cholesterol 90 tablet 1  . sildenafil (VIAGRA) 50 MG tablet TAKE 2  TABLETS BY MOUTH DAILY AS NEEDED FOR ERECTILE DYSFUNCTION (Patient taking differently: Take 50-100 mg by mouth as needed for erectile dysfunction. ) 20 tablet 1  . Vitamin D, Ergocalciferol, (DRISDOL) 50000 units CAPS capsule Take 1 capsule (50,000 Units total) by mouth every 7 (seven) days. 12 capsule 0   Current Facility-Administered Medications on File Prior to Visit  Medication Dose Route Frequency Provider Last Rate Last Admin  . sodium chloride flush (NS) 0.9 % injection 3 mL  3 mL Intravenous Q12H Lorretta Harp, MD         No Known Allergies   Family History  Problem Relation Age of Onset  . CAD Father   . Hypertension Father   . Alcohol abuse Father        Cause of death  . Diabetes Mother   . Colon polyps Mother   . CAD Brother 34       CABG  . Colon cancer Neg Hx   . Esophageal cancer Neg Hx   . Rectal cancer Neg Hx   . Stomach cancer Neg Hx      Social History   Occupational History  . Occupation: Caregiver    Comment: Special needs  Tobacco Use  . Smoking status: Former Smoker    Types: Cigarettes  . Smokeless tobacco: Never Used  Substance and Sexual Activity  . Alcohol use: Not Currently    Comment: rare  . Drug use: No  . Sexual activity: Not Currently     Immunization History  Administered Date(s) Administered  . Pneumococcal Conjugate-13 09/13/2016  . Pneumococcal Polysaccharide-23 08/15/2012, 01/14/2019  . Tdap 06/06/2016     Review of systems: Positive Findings in bold print.  Constitutional:  chills, fatigue, fever, sweats, weight change Communication: Optometrist, sign Ecologist, hand writing, iPad/Android device Head: head injury Eyes: changes in vision, eye pain, glaucoma, cataracts, macular degeneration, diplopia, glare, light sensitivity, eyeglasses or contacts, blindness Ears nose mouth throat: hearing impaired, hearing aids,  ringing in ears, deaf, sign language,  vertigo, nosebleeds,  rhinitis,  cold sores, snoring,  swollen glands Cardiovascular: HTN, edema, arrhythmia, pacemaker in place, defibrillator in place, chest pain/tightness, chronic anticoagulation, blood clot, heart failure, MI Peripheral Vascular: leg cramps, varicose veins, blood clots, lymphedema, varicosities Respiratory:  asthma, difficulty breathing, denies congestion, SOB, wheezing, cough, emphysema, oxygen dependent Gastrointestinal: change in appetite or weight, abdominal pain, constipation, diarrhea, nausea, vomiting, vomiting blood, change in bowel habits, abdominal pain, jaundice, rectal bleeding, hemorrhoids, GERD Genitourinary:  nocturia,  pain on urination, polyuria,  blood in urine, Foley catheter, urinary urgency, ESRD on hemodialysis Musculoskeletal: amputation(s), cramping, stiff joints, painful joints, decreased joint motion, fractures, OA, gout, hemiplegia, paraplegia, uses cane, wheelchair bound, uses walker, uses rollator Skin: changes in toenails, color change, dryness, itching, mole changes,  rash, wound(s) Neurological: headaches, numbness in feet, paresthesias in feet, burning in feet, fainting,  seizures, change in  speech. denies headaches, memory problems/poor historian, cerebral palsy, weakness, paralysis, CVA, TIA, tremors Endocrine: diabetes, hypothyroidism, hyperthyroidism,  goiter, dry mouth, flushing, heat intolerance,  cold intolerance,  excessive thirst, denies polyuria,  nocturia Hematological:  easy bleeding, excessive bleeding, easy bruising, enlarged lymph nodes, on long term blood thinner, history of past transusions Allergy/immunological:  hives, eczema, frequent infections, multiple drug allergies, seasonal allergies, transplant recipient, multiple food allergies Psychiatric:  anxiety, depression, mood disorder, suicidal ideations, hallucinations, insomnia   Objective:  Physical Exam: There were no vitals filed for this visit.   Mr. Martino is a pleasant 69 y.o. African American male, WD, WN in NAD. AAO x  3.  Vascular Examination: Neurovascular status unchanged b/l lower extremities. Capillary refill time to digits <4 seconds b/l. Palpable DP pulses b/l. Palpable PT pulses b/l. Pedal hair absent b/l. Skin temperature gradient within normal limits b/l.  Dermatological Examination: Pedal skin with normal turgor, texture and tone bilaterally. No open wounds bilaterally. No interdigital macerations bilaterally. Mycotic toenails recently debrided. Adequate length. No erythema, no edema, no drainage, no flocculence.  Wound Location: L hallux There is minimal amount of non-viable periulcerative hyperkeratotic tissue present in the wound. Predebridement Wound Measurement:  0.8  x 1.4 x 0.2 cm. Postdebridement Wound Measurement: 1.0 x 1.6 x 0.2 cm. Wound Base: Granular/Healthy Peri-wound: Calloused Exudate: None: wound tissue dry Blood Loss during debridement: <1 cc('s). Description of tissue removed from ulceration today: devitalized hyperkeratosis. Signs of clinical bacterial infection are absent. Material in wound which inhibits healing/promotes adjacent tissue breakdown: devtitalized hyperkeratosis.  Wound Location: L 2nd toe There is minimal amount of devitalized hyperkeratosis present in the wound. Predebridement Wound Measurement:  0.5  x 0.7 x 0.2 cm. Postdebridement Wound Measurement: 0.5 x 0.8 x 0.2 cm. Wound Base: Granular/Healthy Peri-wound: Calloused Exudate: None: wound tissue dry Blood Loss during debridement: <1 cc('s). Description of tissue removed from ulceration today: devitalized hyperkeratosis. Signs of clinical bacterial infection are absent. Material in wound which inhibits healing/promotes adjacent tissue breakdown: devitalized hyperekratosis.  Wound Location: L 3rd toe There is minimal amount of devitalized hyperkeratosis present in the wound. Predebridement Wound Measurement:  0.6  x 0.8 x 0.2 cm. Postdebridement Wound Measurement: 0.6 x 1.0 x 0.2 cm. Wound Base:  Granular/Healthy Peri-wound: Calloused Exudate: None: wound tissue dry Blood Loss during debridement: <1 cc('s). Description of tissue removed from ulceration today: devitalized hyperkeratosis. Signs of clinical bacterial infection are absent. Material in wound which inhibits healing/promotes adjacent tissue breakdown: devitalized hyperkeratosis.  Wound Location: L 4th toe There is minimal amount of devitalized hyperkeratosis in the wound. Predebridement Wound Measurement:  1.5  x 0.9 x 0.2 cm. Postdebridement Wound Measurement: 1.5 x 2.0 x 0.2 cm. Wound Base: Fibrogranular center overlying granular/healthy tissue Peri-wound: Calloused Exudate: None: wound tissue dry Blood Loss during debridement: <1 cc('s). Description of tissue removed from ulceration today: fibrous tissue. Signs of clinical bacterial infection are absent. Material in wound which inhibits healing/promotes adjacent tissue breakdown:  fibrogranular center.  Wound Location: R hallux There is a minimal amount of devitalized hyperkeratotic tissue in the wound. Predebridement Wound Measurement:  0.6  x 0.5 x 0.1 cm. Postdebridement Wound Measurement: 0.6  x 0.6 x 0.1cm. Wound Base: Granular/Healthy Peri-wound: Calloused Exudate: None: wound tissue dry Blood Loss during debridement: <1 cc('s). Description of tissue removed from ulceration today: devitalized hyperkeratosis. Signs of clinical bacterial infection are absent. Material in wound which inhibits healing/promotes adjacent tissue breakdown:  Devitalized hyperkeratosis.  Wound Location: R 2nd toe is healed.  Musculoskeletal Examination: Normal muscle strength 5/5 to all lower extremity muscle groups bilaterally. No pain crepitus or joint limitation noted with ROM b/l. Hallux valgus with bunion deformity noted b/l lower extremities. Tailor's bunion deformity noted b/l lower extremities. Hammertoes noted to the 2-5 bilaterally.  Neurological  Examination: Protective sensation diminished with 10g monofilament b/l. Vibratory sensation diminished b/l.  Hemoglobin A1C Latest Ref Rng & Units 01/16/2019  HGBA1C 0.0 - 7.0 % 7.6(A)  Some recent data might be hidden      Lab Results  Component Value Date   HGBA1C 7.6 (A) 01/16/2019    No results for input(s): GRAMSTAIN, LABORGA in the last 8760 hours.   Lab Results  Component Value Date   WBC 6.0 08/02/2019   HGB 15.0 08/08/2019   HCT 44.0 08/08/2019   MCV 86 08/02/2019   PLT 318 08/02/2019     VAS [place] ABI WITH/WO TBI  Result Date: 08/18/2019 LOWER EXTREMITY DOPPLER STUDY Indications: Ulceration, peripheral artery disease, and Patient indicates right leg feels much better since procedure and no longer is getting      stabbing pains in right leg. He has non-healing ulcers on both sets of toes and does not feel them due to neuropathy.  High Risk  Hypertension, hyperlipidemia, Diabetes, past history of Factors:          smoking. Other Factors: SEE RIGHT LEG ARTERIAL DUPLEX REPORT.    Vascular Interventions: 08/08/2019 [facility] 1 directional atherectomy right                         popliteal artery and right SFA, drug-coated balloon angioplasty right popliteal artery and right SFA. Comparison Study: Prior ABI on 07/23/2019 Right .76 Left .52 Performing Technologist: Salvadore Dom RVT  Examination Guidelines: A complete evaluation includes at minimum, Doppler waveform signals and systolic blood pressure reading at the level of bilateral brachial, anterior tibial, and posterior tibial arteries, when vessel segments are accessible. Bilateral testing is considered an integral part of a complete examination. Photoelectric Plethysmograph (PPG) waveforms and toe systolic pressure readings are included as required and additional duplex testing as needed. Limited examinations for reoccurring indications may be performed as noted.    ABI Findings:  +---------+------------------+-----+-----------+--------+ Right    Rt Pressure (mmHg)IndexWaveform   Comment  +---------+------------------+-----+-----------+--------+ Brachial 161                                        +---------+------------------+-----+-----------+--------+ ATA      193               1.20 monophasic          +---------+------------------+-----+-----------+--------+ PTA      237               1.47 multiphasic         +---------+------------------+-----+-----------+--------+ PERO     171               1.06 monophasic          +---------+------------------+-----+-----------+--------+ Great Toe121               0.75 Abnormal            +---------+------------------+-----+-----------+--------+ +---------+------------------+-----+----------+-------+ Left     Lt Pressure (mmHg)IndexWaveform  Comment +---------+------------------+-----+----------+-------+ Brachial 159                                      +---------+------------------+-----+----------+-------+  ATA      97                0.60 monophasic        +---------+------------------+-----+----------+-------+ PTA      123               0.76 monophasic        +---------+------------------+-----+----------+-------+ PERO     103               0.64 monophasic        +---------+------------------+-----+----------+-------+ Great Toe61                0.38 Abnormal          +---------+------------------+-----+----------+-------+ +-------+-----------+-----------+------------+------------+ ABI/TBIToday's ABIToday's TBIPrevious ABIPrevious TBI +-------+-----------+-----------+------------+------------+ Right  1.47 ([place])  .75        .76         .91          +-------+-----------+-----------+------------+------------+ Left   .76        .38        .74         .87          +-------+-----------+-----------+------------+------------+ Right ABIs appear increased compared to prior  study on 07/23/2019. Left ABIs appear essentially unchanged compared to prior study on 07/23/2019.  Summary: Right: Resting right ankle-brachial index indicates noncompressible right lower extremity arteries. The right toe-brachial index is normal. Left: Resting left ankle-brachial index indicates moderate left lower extremity arterial disease. The left toe-brachial index is abnormal.  *See table(s) above for measurements and observations.  Suggest follow up study in 6 months.  Electronically signed by Carlyle Dolly MD on 08/18/2019 at 10:33:21 AM.    Final    Assessment:   1. Diabetic ulcer of toe associated with type 2 diabetes mellitus, limited to breakdown of skin, unspecified laterality ([place])   2. Blister of foot with infection, unspecified laterality, initial encounter    Plan:  Patient was evaluated and treated and all questions answered.  Patient/POA/Family member educated on diagnosis and treatment plan of routine ulcer debridement/wound care.  Rx refilled for Silvadene Cream. Ulceration debridement achieved utilizing sharp debridement with sterile tissue nipper and sterile scalpel blade.  Type/amount of devitalized tissue removed: b/l hallux, left 2nd, left 3rd digits: devitalized hyperkeratosis.  Today's ulcer size post-debridement (Please see above description for multiple ulcerations:  Ulcerations cleansed with wound cleanser. silvadene cream applied to base of ulcerations and secured with light dressing. Wound responded well to today's debridement. Patient risk factors affecting healing of ulcer: diabetic neuropathy, deformity, PAD Frequency of debridements needed to achieve healing: weekly -Patient was given written instructions on offloading of ulceration and daily dressing changes. Strict orders were given to keep foot dry. Patient was instructed to call immediately if any signs or symptoms of infection arise.  -Patient instructed to report to emergency department with worsening  appearance of ulcer/toe/foot, increased pain, foul odor, increased redness, swelling, drainage, fever, chills, nightsweats, nausea, vomiting, increased blood sugar.  -Patient/POA to call should there be question/concern in the interim.  Return in about 1 week (around 08/27/2019).  Marzetta Board, DPM

## 2019-08-26 ENCOUNTER — Other Ambulatory Visit: Payer: Self-pay

## 2019-08-26 ENCOUNTER — Encounter: Payer: Self-pay | Admitting: Podiatry

## 2019-08-26 ENCOUNTER — Telehealth: Payer: Self-pay | Admitting: Family Medicine

## 2019-08-26 ENCOUNTER — Ambulatory Visit (INDEPENDENT_AMBULATORY_CARE_PROVIDER_SITE_OTHER): Payer: Medicare HMO | Admitting: Podiatry

## 2019-08-26 VITALS — Temp 97.2°F

## 2019-08-26 DIAGNOSIS — E1142 Type 2 diabetes mellitus with diabetic polyneuropathy: Secondary | ICD-10-CM

## 2019-08-26 DIAGNOSIS — L97501 Non-pressure chronic ulcer of other part of unspecified foot limited to breakdown of skin: Secondary | ICD-10-CM

## 2019-08-26 DIAGNOSIS — L89899 Pressure ulcer of other site, unspecified stage: Secondary | ICD-10-CM

## 2019-08-26 DIAGNOSIS — E11621 Type 2 diabetes mellitus with foot ulcer: Secondary | ICD-10-CM

## 2019-08-26 IMAGING — MR MR FOOT*L* WO/W CM
8 of 13 series · 21 of 40 positions shown · IV contrast (15 MAGNEVIST)
Comparison: Radiographs 03/01/2018

CLINICAL DATA: Pain and swelling of the second toe.

EXAM:
MRI OF THE LEFT FOREFOOT WITHOUT AND WITH CONTRAST
TECHNIQUE: Multiplanar, multisequence MR imaging of the left foot was performed
both before and after administration of intravenous contrast.
CONTRAST:  15mL MULTIHANCE GADOBENATE DIMEGLUMINE 529 MG/ML IV SOLN

[Series 4: T1 · coronal · 4.0mm · 0.25mm/px · 3 of 21 slices shown (1 of 3)]
[im 1/21]
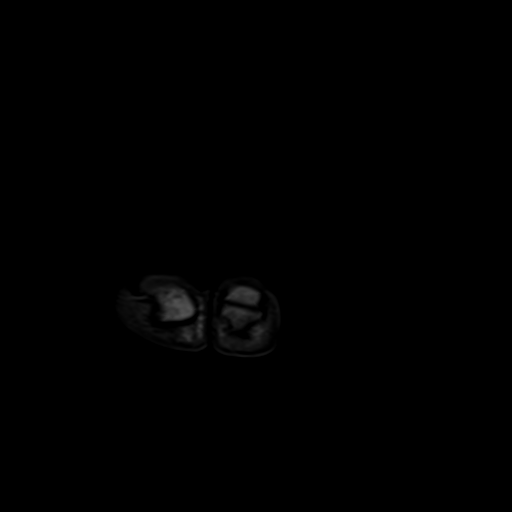
[im 11/21]
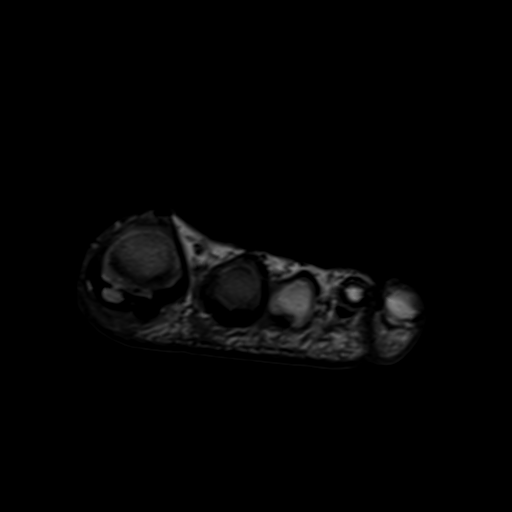
[im 21/21]
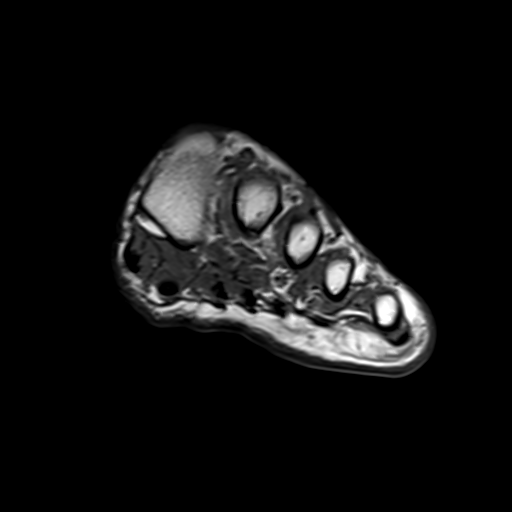

[Series 5: T1 fat-sat · coronal · 4.0mm · 0.25mm/px · 3 of 21 slices shown (1 of 2)]
[im 1/21]
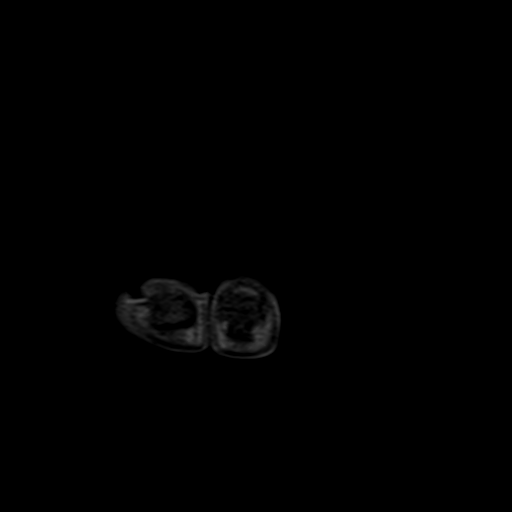
[im 11/21]
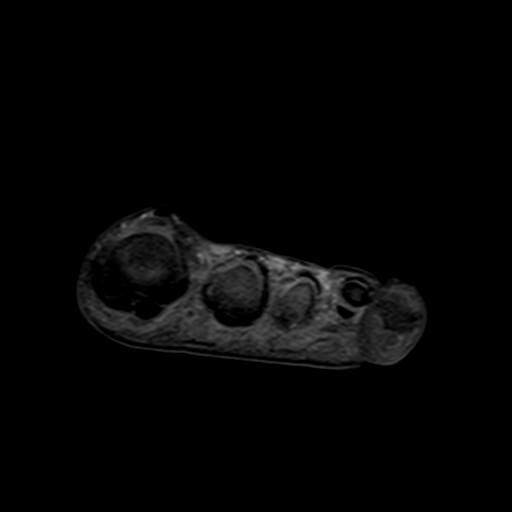
[im 21/21]
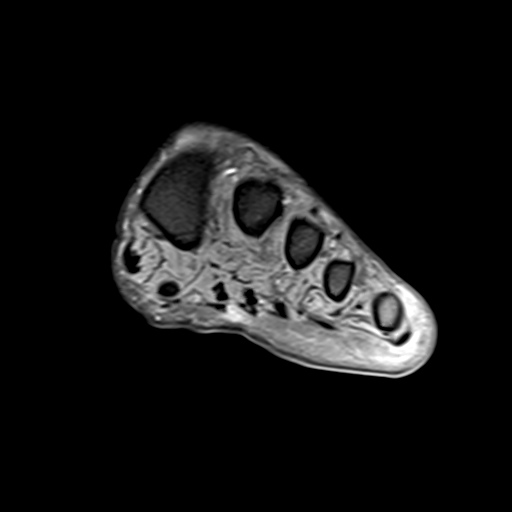

[Series 6: T2 fat-sat · coronal · 4.0mm · 0.51mm/px · 3 of 21 slices shown (1 of 2)]
[im 1/21]
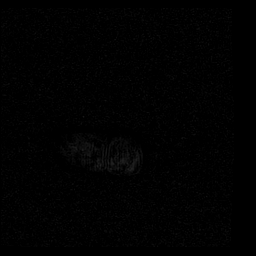
[im 11/21]
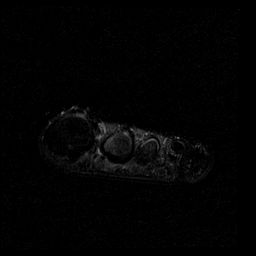
[im 21/21]
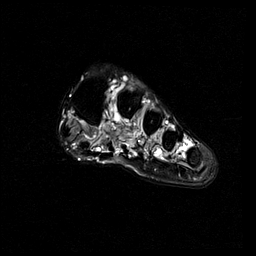

[Series 7: T1 · axial · 4.0mm · 0.28mm/px · z∈[-78,-13]mm · 3 of 15 slices shown (2 of 3)]
[im 1/15]
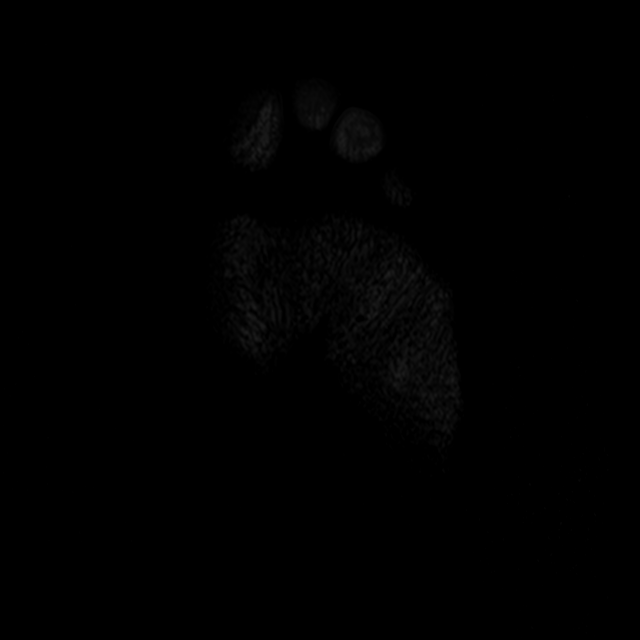
[im 8/15]
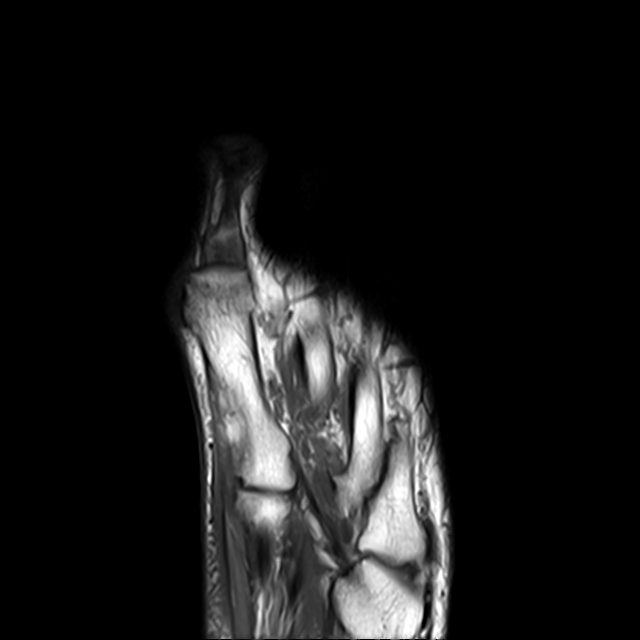
[im 15/15]
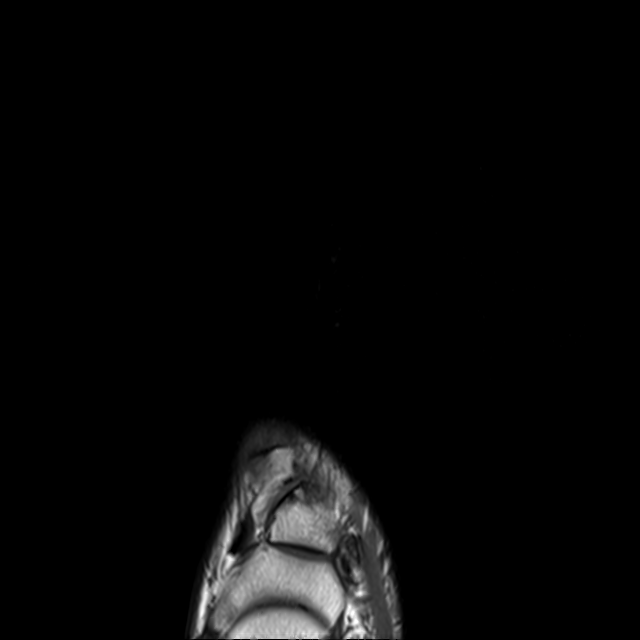

[Series 12: T2 fat-sat · coronal · 4.0mm · 0.51mm/px · 2 of 11 slices shown (2 of 2)]
[im 1/11]
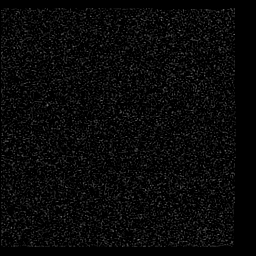
[im 11/11]
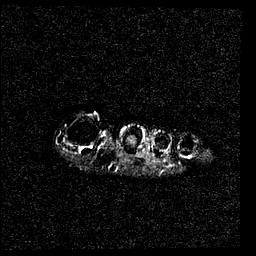

[Series 13: T1 · coronal · 4.0mm · 0.25mm/px · 2 of 10 slices shown (3 of 3)]
[im 1/10]
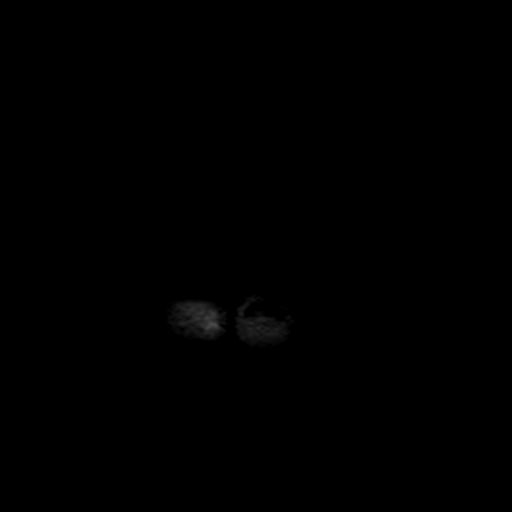
[im 10/10]
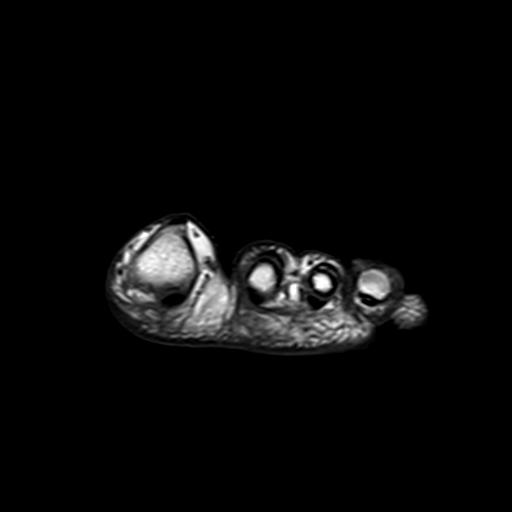

[Series 14: T1 fat-sat · coronal · 4.0mm · 0.25mm/px · 2 of 10 slices shown (2 of 2)]
[im 1/10]
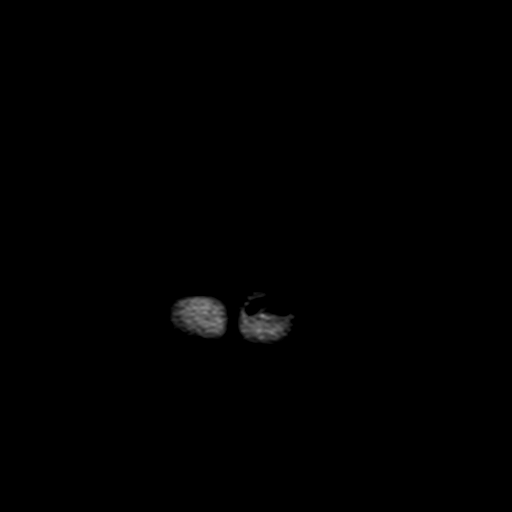
[im 10/10]
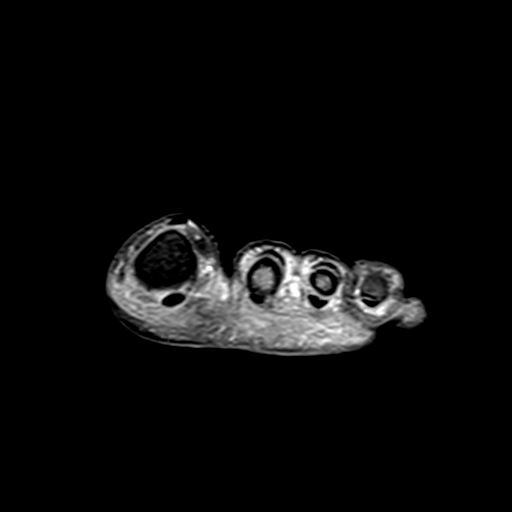

[Series 16: T1 fat-sat post-contrast · axial · 4.0mm · 0.35mm/px · z∈[-79,-14]mm · 3 of 15 slices shown]
[im 1/15]
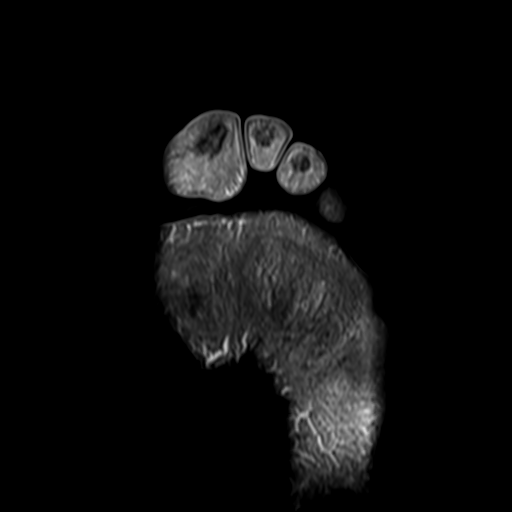
[im 8/15]
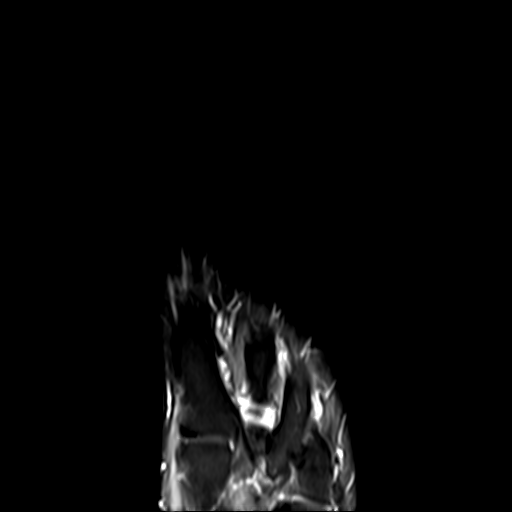
[im 15/15]
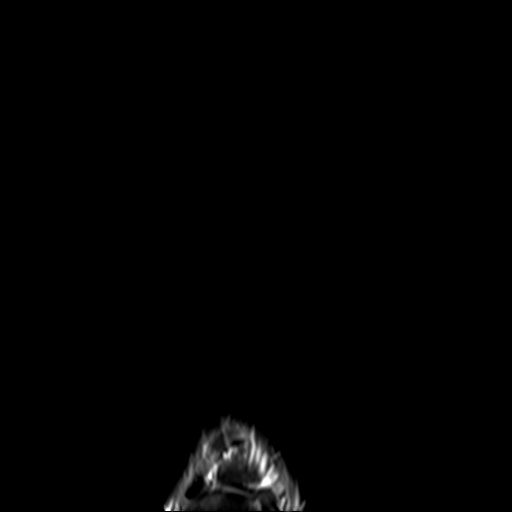

[21 of 40 positions shown; findings below may reference images not displayed]

FINDINGS: Examination is limited due to patient motion and very heterogeneous
fat saturation.

There is mild T2 signal abnormality in the proximal phalanx of the
second toe. I suspect there is also mild enhancement after contrast
administration. This could be reactive change or benign osteitis but
could not exclude osteomyelitis. I would expect more surrounding
soft tissue swelling/edema and enhancement. No MR findings to
suggest septic arthritis.

Mild soft tissue swelling/edema involving the subcutaneous tissues
of the second toe but no abscess. No findings for myofasciitis or
pyomyositis.
IMPRESSION: 1. Limited examination but suspect mild changes of osteomyelitis
involving the second proximal phalanx with mild surrounding
cellulitis but no abscess, septic arthritis or pyomyositis.
2. The other bony structures are intact.

## 2019-08-26 NOTE — Progress Notes (Signed)
Subjective:  Patient ID: Caleb [name], male    DOB: 1950/11/26,  MRN: [id number]  69 y.o. male is seen for follow up ulcer L hallux, L 2nd toe, L 3rd toe, L 4th toe and R hallux.  Prescribed wound care regimen: Silvadene dressing once daily.  Offloading achieved with Darco shoe left foot.  Patient relates wounds are better than previous visit.  Hx confirmed with Mr. Morrish.  Patient denies fever, chills, night sweats, nausea, or vomiting.  He states he is scheduled for a stress test and will be having his carotid blockages addressed.  He voices no new pedal concerns on today's visit.  Past Medical History:  Diagnosis Date  . Diabetes mellitus without complication ([place])   . Dyslipidemia 09/27/2012  . Erectile dysfunction 09/27/2012  . Heart murmur   . Hypercholesteremia   . Hyperlipidemia 08/12/2012  . Hypertension   . Hyponatremia 08/14/2012  . Neuropathy   . Pancreatitis   . Pancreatitis, acute 09/27/2012  . Vitamin D deficiency 06/23/2015     Past Surgical History:  Procedure Laterality Date  . ABDOMINAL AORTOGRAM W/LOWER EXTREMITY N/A 08/08/2019   Procedure: ABDOMINAL AORTOGRAM W/LOWER EXTREMITY;  Surgeon: Lorretta Harp, MD;  Location: [facility];  Service: Cardiovascular;  Laterality: N/A;  . COLONOSCOPY  12 years ago   in [place] clinic= normal exam per pt  . KNEE SURGERY    . PERIPHERAL VASCULAR INTERVENTION Right 08/08/2019   Procedure: PERIPHERAL VASCULAR INTERVENTION;  Surgeon: Lorretta Harp, MD;  Location: [facility];  Service: Cardiovascular;  Laterality: Right;     Current Outpatient Medications on File Prior to Visit  Medication Sig Dispense Refill  . aspirin EC 81 MG tablet Take 81 mg by mouth daily.    . Blood Glucose Monitoring Suppl (ONETOUCH VERIO) w/Device KIT 1 each by Does not apply route 2 (two) times daily. Use as instructed twice daily, check fasting sugar in morning and check before bedtime. E11.8 1 kit 0  . clopidogrel (PLAVIX)  75 MG tablet Take 1 tablet (75 mg total) by mouth daily. 30 tablet 11  . collagenase (SANTYL) ointment Apply 1 application topically daily. Apply Santyl Ointment to scab on left 3rd and 4th digits. Measurements: Left 3rd toe 1.0 x 0.6 cm Left 4th toe: 2.0 x 1.0 cm (Patient taking differently: Apply 1 application topically daily as needed (Scab). Apply Santyl Ointment to scab on left 3rd and 4th digits. Measurements: Left 3rd toe 1.0 x 0.6 cm Left 4th toe: 2.0 x 1.0 cm) 30 g 0  . Continuous Blood Gluc Receiver (FREESTYLE LIBRE 14 DAY READER) DEVI 1 each by Does not apply route See admin instructions. Use as directed to check blood glucose 1 Device 0  . Continuous Blood Gluc Sensor (FREESTYLE LIBRE 14 DAY SENSOR) MISC INJECT IN THE SKIN EVERY 14 (FOURTEEN) DAYS. E11.69 100 each 2  . gabapentin (NEURONTIN) 300 MG capsule Take 1 capsule (300 mg total) by mouth 2 (two) times daily. (Patient taking differently: Take 300 mg by mouth daily as needed (pain). ) 180 capsule 1  . glucose blood test strip Use as instructed twice daily, check fasting sugar in morning and check before bedtime. E11.8 100 each 12  . HUMULIN 70/30 (70-30) 100 UNIT/ML injection INJECT 37 UNITS INTO THE SKIN 2 TIMES DAILY WITH A MEAL. (Patient taking differently: Inject 37 Units into the skin 2 (two) times daily as needed (high blood sugar). Under 110 do not take) 60 mL 6  .  lisinopril-hydrochlorothiazide (ZESTORETIC) 20-12.5 MG tablet Take 2 tablets by mouth daily. 180 tablet 1  . metFORMIN (GLUCOPHAGE) 1000 MG tablet Take 1 tablet (1,000 mg total) by mouth 2 (two) times daily with a meal. (Patient taking differently: Take 1,000 mg by mouth daily. ) 360 tablet 1  . metoprolol tartrate (LOPRESSOR) 100 MG tablet Take 1 tablet (100 mg total) by mouth 2 (two) times daily. 180 tablet 1  . ONETOUCH DELICA LANCETS 32I MISC Use as instructed twice daily, check fasting sugar in morning and check before bedtime. E11.8 100 each 12  . rosuvastatin  (CRESTOR) 40 MG tablet Take 1 tablet (40 mg total) by mouth daily. To lower cholesterol 90 tablet 1  . sildenafil (VIAGRA) 50 MG tablet TAKE 2 TABLETS BY MOUTH DAILY AS NEEDED FOR ERECTILE DYSFUNCTION (Patient taking differently: Take 50-100 mg by mouth as needed for erectile dysfunction. ) 20 tablet 1  . silver sulfADIAZINE (SILVADENE) 1 % cream Apply to affected toes once daily and cover with dressing. 400 g 1  . Vitamin D, Ergocalciferol, (DRISDOL) 50000 units CAPS capsule Take 1 capsule (50,000 Units total) by mouth every 7 (seven) days. 12 capsule 0   Current Facility-Administered Medications on File Prior to Visit  Medication Dose Route Frequency Provider Last Rate Last Admin  . sodium chloride flush (NS) 0.9 % injection 3 mL  3 mL Intravenous Q12H Lorretta Harp, MD         No Known Allergies   Family History  Problem Relation Age of Onset  . CAD Father   . Hypertension Father   . Alcohol abuse Father        Cause of death  . Diabetes Mother   . Colon polyps Mother   . CAD Brother 33       CABG  . Colon cancer Neg Hx   . Esophageal cancer Neg Hx   . Rectal cancer Neg Hx   . Stomach cancer Neg Hx      Social History   Occupational History  . Occupation: Caregiver    Comment: Special needs  Tobacco Use  . Smoking status: Former Smoker    Types: Cigarettes  . Smokeless tobacco: Never Used  Substance and Sexual Activity  . Alcohol use: Not Currently    Comment: rare  . Drug use: No  . Sexual activity: Not Currently     Immunization History  Administered Date(s) Administered  . Pneumococcal Conjugate-13 09/13/2016  . Pneumococcal Polysaccharide-23 08/15/2012, 01/14/2019  . Tdap 06/06/2016     Review of systems: Positive Findings in bold print.  Constitutional:  chills, fatigue, fever, sweats, weight change Communication: Optometrist, sign Ecologist, hand writing, iPad/Android device Head: head injury Eyes: changes in vision, eye pain, glaucoma,  cataracts, macular degeneration, diplopia, glare, light sensitivity, eyeglasses or contacts, blindness Ears nose mouth throat: hearing impaired, hearing aids,  ringing in ears, deaf, sign language,  vertigo, nosebleeds,  rhinitis,  cold sores, snoring, swollen glands Cardiovascular: HTN, edema, arrhythmia, pacemaker in place, defibrillator in place, chest pain/tightness, chronic anticoagulation, blood clot, heart failure, MI Peripheral Vascular: leg cramps, varicose veins, blood clots, lymphedema, varicosities Respiratory:  asthma, difficulty breathing, denies congestion, SOB, wheezing, cough, emphysema, oxygen dependent Gastrointestinal: change in appetite or weight, abdominal pain, constipation, diarrhea, nausea, vomiting, vomiting blood, change in bowel habits, abdominal pain, jaundice, rectal bleeding, hemorrhoids, GERD Genitourinary:  nocturia,  pain on urination, polyuria,  blood in urine, Foley catheter, urinary urgency, ESRD on hemodialysis Musculoskeletal: amputation(s), cramping, stiff joints, painful  joints, decreased joint motion, fractures, OA, gout, hemiplegia, paraplegia, uses cane, wheelchair bound, uses walker, uses rollator Skin: changes in toenails, color change, dryness, itching, mole changes,  rash, wound(s) Neurological: headaches, numbness in feet, paresthesias in feet, burning in feet, fainting,  seizures, change in speech. denies headaches, memory problems/poor historian, cerebral palsy, weakness, paralysis, CVA, TIA, tremors Endocrine: diabetes, hypothyroidism, hyperthyroidism,  goiter, dry mouth, flushing, heat intolerance,  cold intolerance,  excessive thirst, denies polyuria,  nocturia Hematological:  easy bleeding, excessive bleeding, easy bruising, enlarged lymph nodes, on long term blood thinner, history of past transusions Allergy/immunological:  hives, eczema, frequent infections, multiple drug allergies, seasonal allergies, transplant recipient, multiple food  allergies Psychiatric:  anxiety, depression, mood disorder, suicidal ideations, hallucinations, insomnia   Objective:  Physical Exam: Vitals:   08/26/19 0837  Temp: (!) 97.2 F (36.2 C)     '@PtName' @ is a pleasant 69 y.o. African American male, WD, WN in NAD. AAO x 3.  Vascular Examination: Neurovascular status unchanged b/l lower extremities. Capillary refill time to digits <4 seconds b/l. Palpable DP pulses b/l. Palpable PT pulses b/l. Pedal hair absent b/l. Skin temperature gradient within normal limits b/l. No pain with calf compression b/l. No edema noted b/l.  Dermatological Examination: Pedal skin with normal turgor, texture and tone bilaterally. No interdigital macerations bilaterally. Toenails 1-5 b/l elongated, discolored, dystrophic, thickened, crumbly with subungual debris and tenderness to dorsal palpation. Mycotic toenails recently debrided.              Wound Location: L hallux There is a minimal amount of devitalized tissue present in the wound. Predebridement Wound Measurement:  0.9  x 1.2 x 0.2 cm. Postdebridement Wound Measurement: 0.9  x 1.4 x 0.2 cm. Wound Base: Granular/Healthy Peri-wound: Calloused Exudate: None: wound tissue dry Blood Loss during debridement: <1 cc('s). Description of tissue removed from ulceration today: devitalized tissue. Signs of clinical bacterial infection are absent. Material in wound which inhibits healing/promotes adjacent tissue breakdown: devitalized tissue.  Wound Location: L 2nd toe There is minimal amount of necrotic devitalized tissue present. Predebridement Wound Measurement:  0.4  x 0.5 x 0.2 cm. Postdebridement Wound Measurement: 0.5 x 0.6 x 0.2 cm. Wound Base: Granular/Healthy Peri-wound: Calloused Exudate: None: wound tissue dry Blood Loss during debridement: <1 cc('s). Description of tissue removed from ulceration today: devitalized tissue. Signs of clinical bacterial infection are absent. Material in  wound which inhibits healing/promotes adjacent tissue breakdown: devitalized tissue.  Wound Location: L 3rd toe There is minimal amount of devitalized tissue present in the wound. Predebridement Wound Measurement:  0.5  x 0.8 x 0.2 cm. Postdebridement Wound Measurement: 0.8 x 0.8 x 0.2 cm. Wound Base: Granular/Healthy Peri-wound: Calloused Exudate: None: wound tissue dry Blood Loss during debridement: <1 cc('s). Description of tissue removed from ulceration today: devitalized tissue. Signs of clinical bacterial infection are absent. Material in wound which inhibits healing/promotes adjacent tissue breakdown: devitalized tissue.  Wound Location: L 4th toe There is moderate devitalized tissue present in the wound. Predebridement Wound Measurement:  1.5  x 0.5 x 0.2 cm. Postdebridement Wound Measurement: 1.5 x 1.0 x 0.2 cm. Wound Base: Mixed Granular/Fibrotic Peri-wound: Calloused Exudate: None: wound tissue dry Blood Loss during debridement: <1 cc('s). Description of tissue removed from ulceration today: devitalized tissue. Signs of clinical bacterial infection are absent. Material in wound which inhibits healing/promotes adjacent tissue breakdown:  Devitalized tissue.  Wound Location: R hallux There is minimal amount of necrotic devitalized tissue present in the wound.  Predebridement Wound Measurement:  0.5  x 0.7 x 0.2 cm. Postdebridement Wound Measurement: 0.5 x 0.8 x 0.1 cm. Wound Base: Granular/Healthy Peri-wound: Calloused Exudate: None: wound tissue dry Blood Loss during debridement: <1 cc('s). Description of tissue removed from ulceration today: devitalized tissue. Signs of clinical bacterial infection are absent. Material in wound which inhibits healing/promotes adjacent tissue breakdown: devitalized tissue.  New wound noted interdigitally left 3rd digit pressure ulcer nonstageable. No erythema, no edema, no ischemia, no drainage, no flocculence. No signs of  infection.  Musculoskeletal Examination: Normal muscle strength 5/5 to all lower extremity muscle groups bilaterally. No pain crepitus or joint limitation noted with ROM b/l. Hallux valgus with bunion deformity noted b/l lower extremities. Tailor's bunion deformity noted b/l lower extremities. Hammertoes noted to the 2-5 bilaterally.  Neurological Examination: Protective sensation diminished with 10g monofilament b/l.  Hemoglobin A1C Latest Ref Rng & Units 01/16/2019  HGBA1C 0.0 - 7.0 % 7.6(A)  Some recent data might be hidden      Lab Results  Component Value Date   HGBA1C 7.6 (A) 01/16/2019      No results for input(s): GRAMSTAIN, LABORGA in the last 8760 hours.   Lab Results  Component Value Date   WBC 6.0 08/02/2019   HGB 15.0 08/08/2019   HCT 44.0 08/08/2019   MCV 86 08/02/2019   PLT 318 08/02/2019     VAS [place] ABI WITH/WO TBI  Result Date: 08/18/2019 LOWER EXTREMITY DOPPLER STUDY Indications: Ulceration, peripheral artery disease, and Patient indicates right              leg feels much better since procedure and no longer is getting              stabbing pains in right leg. He has non-healing ulcers on both sets              of toes and does not feel them due to neuropathy. High Risk         Hypertension, hyperlipidemia, Diabetes, past history of Factors:          smoking. Other Factors: SEE RIGHT LEG ARTERIAL DUPLEX REPORT.  Vascular Interventions: 08/08/2019 [facility] 1 directional atherectomy right                         popliteal artery and right SFA, drug-coated balloon                         angioplasty right popliteal artery and right SFA. Comparison Study: Prior ABI on 07/23/2019 Right .76 Left .51 Performing Technologist: Salvadore Dom RVT  Examination Guidelines: A complete evaluation includes at minimum, Doppler waveform signals and systolic blood pressure reading at the level of bilateral brachial, anterior tibial, and posterior tibial arteries, when vessel segments are  accessible. Bilateral testing is considered an integral part of a complete examination. Photoelectric Plethysmograph (PPG) waveforms and toe systolic pressure readings are included as required and additional duplex testing as needed. Limited examinations for reoccurring indications may be performed as noted.  ABI Findings: +---------+------------------+-----+-----------+--------+ Right    Rt Pressure (mmHg)IndexWaveform   Comment  +---------+------------------+-----+-----------+--------+ Brachial 161                                        +---------+------------------+-----+-----------+--------+ ATA      193  1.20 monophasic          +---------+------------------+-----+-----------+--------+ PTA      237               1.47 multiphasic         +---------+------------------+-----+-----------+--------+ PERO     171               1.06 monophasic          +---------+------------------+-----+-----------+--------+ Great Toe121               0.75 Abnormal            +---------+------------------+-----+-----------+--------+ +---------+------------------+-----+----------+-------+ Left     Lt Pressure (mmHg)IndexWaveform  Comment +---------+------------------+-----+----------+-------+ Brachial 159                                      +---------+------------------+-----+----------+-------+ ATA      97                0.60 monophasic        +---------+------------------+-----+----------+-------+ PTA      123               0.76 monophasic        +---------+------------------+-----+----------+-------+ PERO     103               0.64 monophasic        +---------+------------------+-----+----------+-------+ Great Toe61                0.38 Abnormal          +---------+------------------+-----+----------+-------+ +-------+-----------+-----------+------------+------------+ ABI/TBIToday's ABIToday's TBIPrevious ABIPrevious TBI  +-------+-----------+-----------+------------+------------+ Right  1.47 ([place])  .75        .76         .91          +-------+-----------+-----------+------------+------------+ Left   .76        .38        .74         .87          +-------+-----------+-----------+------------+------------+ Right ABIs appear increased compared to prior study on 07/23/2019. Left ABIs appear essentially unchanged compared to prior study on 07/23/2019.  Summary: Right: Resting right ankle-brachial index indicates noncompressible right lower extremity arteries. The right toe-brachial index is normal. Left: Resting left ankle-brachial index indicates moderate left lower extremity arterial disease. The left toe-brachial index is abnormal.  *See table(s) above for measurements and observations.  Suggest follow up study in 6 months. Electronically signed by Carlyle Dolly MD on 08/18/2019 at 10:33:21 AM.    Final     Radiographs:    Assessment:  No diagnosis found. Plan:  -Patient was evaluated and treated and all questions answered.  -Patient/POA/Family member educated on diagnosis and treatment plan of routine ulcer debridement/wound care.  -Examined patient. -Patient to continue soft, supportive shoe gear daily. Continue Darco shoe to left foot. -Patient to report any pedal injuries to medical professional immediately. -Ulcers debrided of devitalized tissue and resected to the level of healthy subcutaneous tissue. All ulcers bled well indicating good perfusion to digits. Ulcers cleansed with wound cleanser. Silvadene Cream was applied to bases of wounds with light dressing. -Placed toe separator between left 3rd and 4th toes. He was given several to keep toes from rubbing together. Monitor. -Wounds responded well to today's debridement. They all continue to heal.  -Patient risk factors affecting healing of ulcer: NIDDM with PAD, diabetic neuropathy, foot deformity -Frequency  of debridements needed to achieve  healing:weekly -Patient/POA to call should there be question/concern in the interim.  Return in about 1 week (around 09/02/2019) for diabetic ulcer.  Marzetta Board, DPM

## 2019-08-26 NOTE — Telephone Encounter (Signed)
Patient called saying that he needs an authorization form in order for him to get his [place] sensor. Patient states that if he pays out of pocket it's over $100 s and he needs the [place] specifically because he is on a blood thinner and patient has neuropathy. Claiborne Billings said that he does not need a PA and does not really know what [employer] needs. Please f/u with [employer] at [phone]. You can also contact [place] at [phone].

## 2019-08-27 ENCOUNTER — Ambulatory Visit (HOSPITAL_COMMUNITY): Payer: Medicare HMO | Attending: Cardiovascular Disease

## 2019-08-27 DIAGNOSIS — R011 Cardiac murmur, unspecified: Secondary | ICD-10-CM | POA: Insufficient documentation

## 2019-08-27 NOTE — Telephone Encounter (Signed)
Yes, I am not sure what he needs-I was getting him confused with an Internal Med pt.  His ins prefers Scientist, research (physical sciences).

## 2019-08-27 NOTE — Telephone Encounter (Signed)
Caleb [name],   Is this the patient we were discussing earlier?

## 2019-08-28 ENCOUNTER — Telehealth (HOSPITAL_COMMUNITY): Payer: Self-pay

## 2019-08-28 NOTE — Telephone Encounter (Signed)
Encounter complete. 

## 2019-08-30 ENCOUNTER — Ambulatory Visit (HOSPITAL_COMMUNITY)
Admission: RE | Admit: 2019-08-30 | Discharge: 2019-08-30 | Disposition: A | Discharge status: Home / Self Care | Payer: Medicare HMO | Source: Ambulatory Visit | Attending: Cardiology | Admitting: Cardiology

## 2019-08-30 ENCOUNTER — Other Ambulatory Visit: Payer: Self-pay

## 2019-08-30 DIAGNOSIS — Z01810 Encounter for preprocedural cardiovascular examination: Secondary | ICD-10-CM | POA: Diagnosis not present

## 2019-08-30 DIAGNOSIS — I739 Peripheral vascular disease, unspecified: Secondary | ICD-10-CM | POA: Diagnosis not present

## 2019-08-30 LAB — MYOCARDIAL PERFUSION IMAGING
LV dias vol: 153 mL (ref 62–150)
LV sys vol: 96 mL
Peak HR: 103 {beats}/min
Rest HR: 75 {beats}/min
SDS: 2
SRS: 4
SSS: 6
TID: 1.1

## 2019-08-30 MED ORDER — TECHNETIUM TC 99M TETROFOSMIN IV KIT
10.1000 | PACK | Freq: Once | INTRAVENOUS | Status: AC | PRN
  Administered 2019-08-30: 10.1 via INTRAVENOUS
  Filled 2019-08-30: qty 11

## 2019-08-30 MED ORDER — TECHNETIUM TC 99M TETROFOSMIN IV KIT
30.6000 | PACK | Freq: Once | INTRAVENOUS | Status: AC | PRN
  Administered 2019-08-30: 30.6 via INTRAVENOUS
  Filled 2019-08-30: qty 31

## 2019-08-30 MED ORDER — REGADENOSON 0.4 MG/5ML IV SOLN
0.4000 mg | Freq: Once | INTRAVENOUS | Status: AC
  Administered 2019-08-30: 0.4 mg via INTRAVENOUS

## 2019-09-03 ENCOUNTER — Ambulatory Visit (INDEPENDENT_AMBULATORY_CARE_PROVIDER_SITE_OTHER): Payer: Medicare HMO | Admitting: Podiatry

## 2019-09-03 ENCOUNTER — Encounter: Payer: Self-pay | Admitting: Podiatry

## 2019-09-03 ENCOUNTER — Other Ambulatory Visit: Payer: Self-pay

## 2019-09-03 VITALS — Temp 97.3°F

## 2019-09-03 DIAGNOSIS — L97501 Non-pressure chronic ulcer of other part of unspecified foot limited to breakdown of skin: Secondary | ICD-10-CM

## 2019-09-03 DIAGNOSIS — Z794 Long term (current) use of insulin: Secondary | ICD-10-CM | POA: Diagnosis not present

## 2019-09-03 DIAGNOSIS — E1151 Type 2 diabetes mellitus with diabetic peripheral angiopathy without gangrene: Secondary | ICD-10-CM

## 2019-09-03 DIAGNOSIS — E11621 Type 2 diabetes mellitus with foot ulcer: Secondary | ICD-10-CM | POA: Diagnosis not present

## 2019-09-03 DIAGNOSIS — L89899 Pressure ulcer of other site, unspecified stage: Secondary | ICD-10-CM

## 2019-09-03 NOTE — Patient Instructions (Signed)
Diabetes Mellitus and Foot Care Foot care is an important part of your health, especially when you have diabetes. Diabetes may cause you to have problems because of poor blood flow (circulation) to your feet and legs, which can cause your skin to:  Become thinner and drier.  Break more easily.  Heal more slowly.  Peel and crack. You may also have nerve damage (neuropathy) in your legs and feet, causing decreased feeling in them. This means that you may not notice minor injuries to your feet that could lead to more serious problems. Noticing and addressing any potential problems early is the best way to prevent future foot problems. How to care for your feet Foot hygiene  Wash your feet daily with warm water and mild soap. Do not use hot water. Then, pat your feet and the areas between your toes until they are completely dry. Do not soak your feet as this can dry your skin.  Trim your toenails straight across. Do not dig under them or around the cuticle. File the edges of your nails with an emery board or nail file.  Apply a moisturizing lotion or petroleum jelly to the skin on your feet and to dry, brittle toenails. Use lotion that does not contain alcohol and is unscented. Do not apply lotion between your toes. Shoes and socks  Wear clean socks or stockings every day. Make sure they are not too tight. Do not wear knee-high stockings since they may decrease blood flow to your legs.  Wear shoes that fit properly and have enough cushioning. Always look in your shoes before you put them on to be sure there are no objects inside.  To break in new shoes, wear them for just a few hours a day. This prevents injuries on your feet. Wounds, scrapes, corns, and calluses  Check your feet daily for blisters, cuts, bruises, sores, and redness. If you cannot see the bottom of your feet, use a mirror or ask someone for help.  Do not cut corns or calluses or try to remove them with medicine.  If you  find a minor scrape, cut, or break in the skin on your feet, keep it and the skin around it clean and dry. You may clean these areas with mild soap and water. Do not clean the area with peroxide, alcohol, or iodine.  If you have a wound, scrape, corn, or callus on your foot, look at it several times a day to make sure it is healing and not infected. Check for: ? Redness, swelling, or pain. ? Fluid or blood. ? Warmth. ? Pus or a bad smell. General instructions  Do not cross your legs. This may decrease blood flow to your feet.  Do not use heating pads or hot water bottles on your feet. They may burn your skin. If you have lost feeling in your feet or legs, you may not know this is happening until it is too late.  Protect your feet from hot and cold by wearing shoes, such as at the beach or on hot pavement.  Schedule a complete foot exam at least once a year (annually) or more often if you have foot problems. If you have foot problems, report any cuts, sores, or bruises to your health care provider immediately. Contact a health care provider if:  You have a medical condition that increases your risk of infection and you have any cuts, sores, or bruises on your feet.  You have an injury that is not   healing.  You have redness on your legs or feet.  You feel burning or tingling in your legs or feet.  You have pain or cramps in your legs and feet.  Your legs or feet are numb.  Your feet always feel cold.  You have pain around a toenail. Get help right away if:  You have a wound, scrape, corn, or callus on your foot and: ? You have pain, swelling, or redness that gets worse. ? You have fluid or blood coming from the wound, scrape, corn, or callus. ? Your wound, scrape, corn, or callus feels warm to the touch. ? You have pus or a bad smell coming from the wound, scrape, corn, or callus. ? You have a fever. ? You have a red line going up your leg. Summary  Check your feet every day  for cuts, sores, red spots, swelling, and blisters.  Moisturize feet and legs daily.  Wear shoes that fit properly and have enough cushioning.  If you have foot problems, report any cuts, sores, or bruises to your health care provider immediately.  Schedule a complete foot exam at least once a year (annually) or more often if you have foot problems. This information is not intended to replace advice given to you by your health care provider. Make sure you discuss any questions you have with your health care provider. Document Revised: 11/28/2018 Document Reviewed: 04/08/2016 Elsevier Patient Education  2020 [employer]  

## 2019-09-06 ENCOUNTER — Other Ambulatory Visit: Payer: Self-pay

## 2019-09-06 ENCOUNTER — Ambulatory Visit (INDEPENDENT_AMBULATORY_CARE_PROVIDER_SITE_OTHER): Payer: Medicare HMO | Admitting: Cardiovascular Disease

## 2019-09-06 ENCOUNTER — Encounter: Payer: Self-pay | Admitting: Cardiovascular Disease

## 2019-09-06 VITALS — BP 120/82 | HR 84 | Ht 68.0 in | Wt 185.6 lb

## 2019-09-06 DIAGNOSIS — I6529 Occlusion and stenosis of unspecified carotid artery: Secondary | ICD-10-CM | POA: Diagnosis not present

## 2019-09-06 MED FILL — GABAPENTIN 300 MG CAPSULE: 300 | 30 days supply | Qty: 60 | Fill #1

## 2019-09-06 MED FILL — ROSUVASTATIN CALCIUM 40 MG: 40 | 30 days supply | Qty: 30 | Fill #1

## 2019-09-06 MED FILL — CLOPIDOGREL 75 MG TABLET: 75 | 30 days supply | Qty: 30 | Fill #1

## 2019-09-06 MED FILL — LISINOPRIL-HCTZ 20-12.5 MG: 20-12.5 | 30 days supply | Qty: 60 | Fill #1

## 2019-09-06 NOTE — Progress Notes (Signed)
Caleb [name] returns today for follow-up of his outpatient studies.  A 2D echo revealed aortic stenosis.  His aortic valve peak gradient was 22 with a valve area of 1 cm.  He is very active and exercises on a daily basis without chest pain or shortness of breath.  He had global hypokinesia.  A Myoview stress test performed 08/30/2019 suggested inferior scar without ischemia although there were no wall motion abnormalities on the echo.  His carotid Doppler studies performed 08/02/2019 showed severe bilateral ICA stenosis right tighter than left.  I believe he will need carotid revascularization procedure prior to me performing left popliteal intervention for claudication.  I am referring him to Dr. Trula Slade for surgical consideration.  I will see him back in 3 months for follow-up.  Lorretta Harp, M.D., [place], [facility], [place] [address]. [place], [place]  [address]  [phone] 09/06/2019 3:55 PM

## 2019-09-06 NOTE — Patient Instructions (Signed)
Medication Instructions:  Your Physician recommend you continue on your current medication as directed.    *If you need a refill on your cardiac medications before your next appointment, please call your pharmacy*   Lab Work: None    Testing/Procedures: None   Follow-Up: At [facility], you and your health needs are our priority.  As part of our continuing mission to provide you with exceptional heart care, we have created designated Provider Care Teams.  These Care Teams include your primary Cardiologist (physician) and Advanced Practice Providers (APPs -  Physician Assistants and Nurse Practitioners) who all work together to provide you with the care you need, when you need it.  We recommend signing up for the patient portal called "MyChart".  Sign up information is provided on this After Visit Summary.  MyChart is used to connect with patients for Virtual Visits (Telemedicine).  Patients are able to view lab/test results, encounter notes, upcoming appointments, etc.  Non-urgent messages can be sent to your provider as well.   To learn more about what you can do with MyChart, go to [email or web address].    Your next appointment:   3 month(s)  The format for your next appointment:   In Person  Provider:   Quay Burow, MD

## 2019-09-08 NOTE — Progress Notes (Signed)
Subjective:  Patient ID: Caleb [name], male    DOB: Aug 05, 1950,  MRN: [id number]  69 y.o. male is seen for follow up ulcer L hallux, L 2nd toe, L 3rd toe, L 4th toe and R hallux  States he has been getting his feet wet.  Prescribed wound care regimen: Silvadene cream dressings once daily for all digits.  Offloading achieved with Darco Shoe.  Patient relates wounds are better than previous visit.  Patient denies any fever, chills, night sweats, nausea, or vomiting.  Past Medical History:  Diagnosis Date  . Diabetes mellitus without complication ([place])   . Dyslipidemia 09/27/2012  . Erectile dysfunction 09/27/2012  . Heart murmur   . Hypercholesteremia   . Hyperlipidemia 08/12/2012  . Hypertension   . Hyponatremia 08/14/2012  . Neuropathy   . Pancreatitis   . Pancreatitis, acute 09/27/2012  . Vitamin D deficiency 06/23/2015     Past Surgical History:  Procedure Laterality Date  . ABDOMINAL AORTOGRAM W/LOWER EXTREMITY N/A 08/08/2019   Procedure: ABDOMINAL AORTOGRAM W/LOWER EXTREMITY;  Surgeon: Lorretta Harp, MD;  Location: [facility];  Service: Cardiovascular;  Laterality: N/A;  . COLONOSCOPY  12 years ago   in [place] clinic= normal exam per pt  . KNEE SURGERY    . PERIPHERAL VASCULAR INTERVENTION Right 08/08/2019   Procedure: PERIPHERAL VASCULAR INTERVENTION;  Surgeon: Lorretta Harp, MD;  Location: [facility];  Service: Cardiovascular;  Laterality: Right;     Current Outpatient Medications on File Prior to Visit  Medication Sig Dispense Refill  . aspirin EC 81 MG tablet Take 81 mg by mouth daily.    . Blood Glucose Monitoring Suppl (ONETOUCH VERIO) w/Device KIT 1 each by Does not apply route 2 (two) times daily. Use as instructed twice daily, check fasting sugar in morning and check before bedtime. E11.8 1 kit 0  . clopidogrel (PLAVIX) 75 MG tablet Take 1 tablet (75 mg total) by mouth daily. 30 tablet 11  . collagenase (SANTYL) ointment Apply 1 application  topically daily. Apply Santyl Ointment to scab on left 3rd and 4th digits. Measurements: Left 3rd toe 1.0 x 0.6 cm Left 4th toe: 2.0 x 1.0 cm (Patient taking differently: Apply 1 application topically daily as needed (Scab). Apply Santyl Ointment to scab on left 3rd and 4th digits. Measurements: Left 3rd toe 1.0 x 0.6 cm Left 4th toe: 2.0 x 1.0 cm) 30 g 0  . Continuous Blood Gluc Receiver (FREESTYLE LIBRE 14 DAY READER) DEVI 1 each by Does not apply route See admin instructions. Use as directed to check blood glucose 1 Device 0  . Continuous Blood Gluc Sensor (FREESTYLE LIBRE 14 DAY SENSOR) MISC INJECT IN THE SKIN EVERY 14 (FOURTEEN) DAYS. E11.69 100 each 2  . gabapentin (NEURONTIN) 300 MG capsule Take 1 capsule (300 mg total) by mouth 2 (two) times daily. (Patient taking differently: Take 300 mg by mouth daily as needed (pain). ) 180 capsule 1  . glucose blood test strip Use as instructed twice daily, check fasting sugar in morning and check before bedtime. E11.8 100 each 12  . HUMULIN 70/30 (70-30) 100 UNIT/ML injection INJECT 37 UNITS INTO THE SKIN 2 TIMES DAILY WITH A MEAL. (Patient taking differently: Inject 37 Units into the skin 2 (two) times daily as needed (high blood sugar). Under 110 do not take) 60 mL 6  . lisinopril-hydrochlorothiazide (ZESTORETIC) 20-12.5 MG tablet Take 2 tablets by mouth daily. 180 tablet 1  . metFORMIN (GLUCOPHAGE) 1000 MG tablet  Take 1 tablet (1,000 mg total) by mouth 2 (two) times daily with a meal. (Patient taking differently: Take 1,000 mg by mouth daily. ) 360 tablet 1  . metoprolol tartrate (LOPRESSOR) 100 MG tablet Take 1 tablet (100 mg total) by mouth 2 (two) times daily. 180 tablet 1  . ONETOUCH DELICA LANCETS 24M MISC Use as instructed twice daily, check fasting sugar in morning and check before bedtime. E11.8 100 each 12  . rosuvastatin (CRESTOR) 40 MG tablet Take 1 tablet (40 mg total) by mouth daily. To lower cholesterol 90 tablet 1  . sildenafil (VIAGRA) 50  MG tablet TAKE 2 TABLETS BY MOUTH DAILY AS NEEDED FOR ERECTILE DYSFUNCTION (Patient taking differently: Take 50-100 mg by mouth as needed for erectile dysfunction. ) 20 tablet 1  . silver sulfADIAZINE (SILVADENE) 1 % cream Apply to affected toes once daily and cover with dressing. 400 g 1  . Vitamin D, Ergocalciferol, (DRISDOL) 50000 units CAPS capsule Take 1 capsule (50,000 Units total) by mouth every 7 (seven) days. 12 capsule 0   Current Facility-Administered Medications on File Prior to Visit  Medication Dose Route Frequency Provider Last Rate Last Admin  . sodium chloride flush (NS) 0.9 % injection 3 mL  3 mL Intravenous Q12H Lorretta Harp, MD         No Known Allergies   Family History  Problem Relation Age of Onset  . CAD Father   . Hypertension Father   . Alcohol abuse Father        Cause of death  . Diabetes Mother   . Colon polyps Mother   . CAD Brother 31       CABG  . Colon cancer Neg Hx   . Esophageal cancer Neg Hx   . Rectal cancer Neg Hx   . Stomach cancer Neg Hx      Social History   Occupational History  . Occupation: Caregiver    Comment: Special needs  Tobacco Use  . Smoking status: Former Smoker    Types: Cigarettes  . Smokeless tobacco: Never Used  Vaping Use  . Vaping Use: Never used  Substance and Sexual Activity  . Alcohol use: Not Currently    Comment: rare  . Drug use: No  . Sexual activity: Not Currently     Immunization History  Administered Date(s) Administered  . Pneumococcal Conjugate-13 09/13/2016  . Pneumococcal Polysaccharide-23 08/15/2012, 01/14/2019  . Tdap 06/06/2016     Review of systems: Positive Findings in bold print.  Constitutional:  chills, fatigue, fever, sweats, weight change Communication: Optometrist, sign Ecologist, hand writing, iPad/Android device Head: head injury Eyes: changes in vision, eye pain, glaucoma, cataracts, macular degeneration, diplopia, glare, light sensitivity, eyeglasses or  contacts, blindness Ears nose mouth throat: hearing impaired, hearing aids,  ringing in ears, deaf, sign language,  vertigo, nosebleeds,  rhinitis,  cold sores, snoring, swollen glands Cardiovascular: HTN, edema, arrhythmia, pacemaker in place, defibrillator in place, chest pain/tightness, chronic anticoagulation, blood clot, heart failure, MI Peripheral Vascular: leg cramps, varicose veins, blood clots, lymphedema, varicosities Respiratory:  asthma, difficulty breathing, denies congestion, SOB, wheezing, cough, emphysema, oxygen dependent Gastrointestinal: change in appetite or weight, abdominal pain, constipation, diarrhea, nausea, vomiting, vomiting blood, change in bowel habits, abdominal pain, jaundice, rectal bleeding, hemorrhoids, GERD Genitourinary:  nocturia,  pain on urination, polyuria,  blood in urine, Foley catheter, urinary urgency, ESRD on hemodialysis Musculoskeletal: amputation(s), cramping, stiff joints, painful joints, decreased joint motion, fractures, OA, gout, hemiplegia, paraplegia, uses cane, wheelchair  bound, uses walker, uses rollator Skin: changes in toenails, color change, dryness, itching, mole changes,  rash, wound(s) Neurological: headaches, numbness in feet, paresthesias in feet, burning in feet, fainting,  seizures, change in speech. denies headaches, memory problems/poor historian, cerebral palsy, weakness, paralysis, CVA, TIA, tremors Endocrine: diabetes, hypothyroidism, hyperthyroidism,  goiter, dry mouth, flushing, heat intolerance,  cold intolerance,  excessive thirst, denies polyuria,  nocturia Hematological:  easy bleeding, excessive bleeding, easy bruising, enlarged lymph nodes, on long term blood thinner, history of past transusions Allergy/immunological:  hives, eczema, frequent infections, multiple drug allergies, seasonal allergies, transplant recipient, multiple food allergies Psychiatric:  anxiety, depression, mood disorder, suicidal ideations,  hallucinations, insomnia   Objective:  Physical Exam: Vitals:   09/03/19 0813  Temp: (!) 97.3 F (36.3 C)     Mr. Twist is a pleasant 69 y.o. male WD, WN in NAD. AAO x 3.  Vascular Examination: Neurovascular status unchanged b/l lower extremities. Capillary refill time to digits <4 seconds b/l lower extremities. Palpable pedal pulses b/l LE. Pedal hair absent. Lower extremity skin temperature gradient within normal limits. No pain with calf compression b/l. No edema noted b/l lower extremities.  Dermatological Examination: Pedal skin with normal turgor, texture and tone bilaterally. No interdigital macerations bilaterally.                        Wound Location: L hallux There is a minimal amount of devitalized hyperkeratotic tissue present in the wound. Predebridement Wound Measurement:  0.5  x 1.0  x 0.2 cm. Postdebridement Wound Measurement: 0.5 x 1.0 x 0.2 cm. Wound Base: Granular/Healthy Peri-wound: Calloused Exudate: None: wound tissue dry Blood Loss during debridement: 0 cc('s). Description of tissue removed from ulceration today:  Devitalized hyperkeratotic tissue. Sign(s) of clinical bacterial infection: no clinical signs of infection noted on examination today. Material in wound which inhibits healing/promotes adjacent tissue breakdown: devitalized hyperkeratotic tissue.  Wound Location: L 2nd toe There is a minimal amount of devitalized hyperkeratotic tissue present in the wound. Predebridement Wound Measurement:  0.5  x 0.5 x 0.1 cm. Postdebridement Wound Measurement: 0.5 x 0.5 x 0.2 cm. Wound Base: Granular/Healthy Peri-wound: Calloused Exudate: None: wound tissue dry Blood Loss during debridement: <1 cc('s). Description of tissue removed from ulceration today: devitalized hyperkeratosis. Sign(s) of clinical bacterial infection: no clinical signs of infection noted on examination today. Material in wound which inhibits healing/promotes  adjacent tissue breakdown: devitalized hyperkeratosis.  Wound Location: L 3rd toe There is a minimal amount of devitalized tissue present in the wound. Predebridement Wound Measurement:  0.5  x 0.5 x 0 cm. Postdebridement Wound Measurement: 0.5 x 0.5 x 0.1 cm. Wound Base: Granular/Healthy Peri-wound: Calloused Exudate: None: wound tissue dry Blood Loss during debridement: <1 cc('s). Description of tissue removed from ulceration today: devitalized hyperkeratosis. Sign(s) of clinical bacterial infection: no clinical signs of infection noted on examination today. Material in wound which inhibits healing/promotes adjacent tissue breakdown: devitalized hyperkeratosis.  Wound Location: L 4th toe There is a moderate amount of devitalized tissue present in the wound. Predebridement Wound Measurement:  1.4  x 1.0 x 0.1 cm. Postdebridement Wound Measurement: 1.5 x 1.0 x 0.2 cm. Wound Base: Mixed Granular/Fibrotic Peri-wound: Calloused Exudate: None: wound tissue dry Blood Loss during debridement: <1 cc('s). Description of tissue removed from ulceration today: fibrogranular. Sign(s) of clinical bacterial infection: no clinical signs of infection noted on examination today. Material in wound which inhibits healing/promotes adjacent tissue breakdown: devitalized fibrogranular tissue.  Wound Location: R  hallux There is a minimal amount of devitalized tissue present in the wound. Predebridement Wound Measurement:  0.3  x 0.4 x 0 cm. Postdebridement Wound Measurement: 0.4 x 0.5 x 0.1 cm. Wound Base: Granular/Healthy Peri-wound: Calloused Exudate: None: wound tissue dry Blood Loss during debridement: <1 cc('s). Description of tissue removed from ulceration today: devitalized hyperkeratosis. Sign(s) of clinical bacterial infection: no clinical signs of infection noted on examination today. Material in wound which inhibits healing/promotes adjacent tissue breakdown: devitalized  hyperkeratosis.  Unstageable decubitus ulceration lateral aspect left 3rd digit PIPJ. No breaks in skin. No erythema, no edema, no drainage, no flocculence.  Musculoskeletal Examination: Normal muscle strength 5/5 to all lower extremity muscle groups bilaterally. No pain crepitus or joint limitation noted with ROM b/l. Hallux valgus with bunion deformity noted b/l lower extremities. Tailor's bunion deformity noted b/l lower extremities. Hammertoes noted to the b/l lower extremities.  Neurological Examination: Protective sensation diminished with 10g monofilament b/l.  Hemoglobin A1C Latest Ref Rng & Units 01/16/2019  HGBA1C 0.0 - 7.0 % 7.6(A)  Some recent data might be hidden   Lab Results  Component Value Date   HGBA1C 7.6 (A) 01/16/2019    Lab Results  Component Value Date   WBC 6.0 08/02/2019   HGB 15.0 08/08/2019   HCT 44.0 08/08/2019   MCV 86 08/02/2019   PLT 318 08/02/2019     VAS [place] ABI WITH/WO TBI  Result Date: 08/18/2019 LOWER EXTREMITY DOPPLER STUDY Indications: Ulceration, peripheral artery disease, and Patient indicates right              leg feels much better since procedure and no longer is getting              stabbing pains in right leg. He has non-healing ulcers on both sets              of toes and does not feel them due to neuropathy. High Risk         Hypertension, hyperlipidemia, Diabetes, past history of Factors:          smoking. Other Factors: SEE RIGHT LEG ARTERIAL DUPLEX REPORT.  Vascular Interventions: 08/08/2019 [facility] 1 directional atherectomy right                         popliteal artery and right SFA, drug-coated balloon                         angioplasty right popliteal artery and right SFA. Comparison Study: Prior ABI on 07/23/2019 Right .76 Left .33 Performing Technologist: Salvadore Dom RVT  Examination Guidelines: A complete evaluation includes at minimum, Doppler waveform signals and systolic blood pressure reading at the level of bilateral brachial,  anterior tibial, and posterior tibial arteries, when vessel segments are accessible. Bilateral testing is considered an integral part of a complete examination. Photoelectric Plethysmograph (PPG) waveforms and toe systolic pressure readings are included as required and additional duplex testing as needed. Limited examinations for reoccurring indications may be performed as noted.  ABI Findings: +---------+------------------+-----+-----------+--------+ Right    Rt Pressure (mmHg)IndexWaveform   Comment  +---------+------------------+-----+-----------+--------+ Brachial 161                                        +---------+------------------+-----+-----------+--------+ ATA      193  1.20 monophasic          +---------+------------------+-----+-----------+--------+ PTA      237               1.47 multiphasic         +---------+------------------+-----+-----------+--------+ PERO     171               1.06 monophasic          +---------+------------------+-----+-----------+--------+ Great Toe121               0.75 Abnormal            +---------+------------------+-----+-----------+--------+ +---------+------------------+-----+----------+-------+ Left     Lt Pressure (mmHg)IndexWaveform  Comment +---------+------------------+-----+----------+-------+ Brachial 159                                      +---------+------------------+-----+----------+-------+ ATA      97                0.60 monophasic        +---------+------------------+-----+----------+-------+ PTA      123               0.76 monophasic        +---------+------------------+-----+----------+-------+ PERO     103               0.64 monophasic        +---------+------------------+-----+----------+-------+ Great Toe61                0.38 Abnormal          +---------+------------------+-----+----------+-------+ +-------+-----------+-----------+------------+------------+  ABI/TBIToday's ABIToday's TBIPrevious ABIPrevious TBI +-------+-----------+-----------+------------+------------+ Right  1.47 ([place])  .75        .76         .91          +-------+-----------+-----------+------------+------------+ Left   .76        .38        .74         .87          +-------+-----------+-----------+------------+------------+ Right ABIs appear increased compared to prior study on 07/23/2019. Left ABIs appear essentially unchanged compared to prior study on 07/23/2019.  Summary: Right: Resting right ankle-brachial index indicates noncompressible right lower extremity arteries. The right toe-brachial index is normal. Left: Resting left ankle-brachial index indicates moderate left lower extremity arterial disease. The left toe-brachial index is abnormal.  *See table(s) above for measurements and observations.  Suggest follow up study in 6 months. Electronically signed by Carlyle Dolly MD on 08/18/2019 at 10:33:21 AM.    Final   Assessment:  No diagnosis found. Plan:  -Counseled patient on importance of following wound care instructions and keeping feet dry.  -Patient was evaluated and treated and all questions answered.  -Patient/POA/Family member educated on diagnosis and treatment plan of routine ulcer debridement/wound care.  -Ulceration debridement achieved utilizing sharp debridement with sterile scalpel blade.. Type/amount of devitalized tissue removed to level of subcutaneous tissue:devitalized hyperkeratosis left hallux, left 2nd, left 3rd and right hallux; fibrogranular tissue left 4th digit.  -Today's ulcer size post-debridement: see above -Ulceration cleansed with wound cleanser. silvadene cream applied to base of ulceration and secured with light dressing. -Wounds responded well to today's debridement. Good bleeding with debridement. -Patient risk factors affecting healing of ulcer: NIDDM, PAD, neuropathy, foot deformity -Frequency of debridement needed to  achieve healing:  -[name] given written instructions on daily wound care for L hallux, L  2nd toe, L 3rd toe, L 4th toe and R hallux ulceration. -Frequency of debridements needed to achieve healing: weekly. --Patient to report any pedal injuries to medical professional immediately. -Patient/POA to call should there be question/concern in the interim.  Return in about 2 weeks (around 09/17/2019) for diabetic ulcer.  Marzetta Board, DPM

## 2019-09-17 ENCOUNTER — Other Ambulatory Visit: Payer: Self-pay | Admitting: Podiatry

## 2019-09-17 ENCOUNTER — Other Ambulatory Visit: Payer: Self-pay

## 2019-09-17 ENCOUNTER — Ambulatory Visit (INDEPENDENT_AMBULATORY_CARE_PROVIDER_SITE_OTHER): Payer: Medicare HMO

## 2019-09-17 ENCOUNTER — Ambulatory Visit (INDEPENDENT_AMBULATORY_CARE_PROVIDER_SITE_OTHER): Payer: Medicare HMO | Admitting: Podiatry

## 2019-09-17 ENCOUNTER — Telehealth: Payer: Self-pay | Admitting: Family Medicine

## 2019-09-17 ENCOUNTER — Encounter: Payer: Self-pay | Admitting: Podiatry

## 2019-09-17 VITALS — Temp 98.6°F

## 2019-09-17 DIAGNOSIS — I998 Other disorder of circulatory system: Secondary | ICD-10-CM

## 2019-09-17 DIAGNOSIS — M899 Disorder of bone, unspecified: Secondary | ICD-10-CM | POA: Diagnosis not present

## 2019-09-17 DIAGNOSIS — E1142 Type 2 diabetes mellitus with diabetic polyneuropathy: Secondary | ICD-10-CM | POA: Diagnosis not present

## 2019-09-17 DIAGNOSIS — L97522 Non-pressure chronic ulcer of other part of left foot with fat layer exposed: Secondary | ICD-10-CM | POA: Diagnosis not present

## 2019-09-17 DIAGNOSIS — L97502 Non-pressure chronic ulcer of other part of unspecified foot with fat layer exposed: Secondary | ICD-10-CM | POA: Diagnosis not present

## 2019-09-17 DIAGNOSIS — L89899 Pressure ulcer of other site, unspecified stage: Secondary | ICD-10-CM

## 2019-09-17 DIAGNOSIS — E11621 Type 2 diabetes mellitus with foot ulcer: Secondary | ICD-10-CM

## 2019-09-17 DIAGNOSIS — Z794 Long term (current) use of insulin: Secondary | ICD-10-CM | POA: Diagnosis not present

## 2019-09-17 DIAGNOSIS — M869 Osteomyelitis, unspecified: Secondary | ICD-10-CM | POA: Diagnosis not present

## 2019-09-17 DIAGNOSIS — I70229 Atherosclerosis of native arteries of extremities with rest pain, unspecified extremity: Secondary | ICD-10-CM

## 2019-09-17 MED ORDER — AMOXICILLIN-POT CLAVULANATE 500-125 MG PO TABS: 1.0000 | ORAL_TABLET | Freq: Three times a day (TID) | ORAL | 0 refills | Status: AC

## 2019-09-17 MED FILL — AMOX-CLAV 500-125 MG TABLET: 500-125 | 10 days supply | Qty: 30 | Fill #0

## 2019-09-17 NOTE — Telephone Encounter (Signed)
Pls contact pt concerning the previous matter   Patient came in saying that [facility] needs a letter saying that that he needs the [facility]. Patient was told that his PCP had already written a letter for him back in February and patient states that [facility] never received it. Please f/u with pt again.

## 2019-09-17 NOTE — Patient Instructions (Signed)
DRESSING CHANGES left foot :  IF DISPENSED, WEAR SURGICAL SHOE/BOOT AT ALL TIMES   APPLY TOE SEPARATORS BETWEEN TOES DAILY TO PREVENT PRESSURE SORES.  1. KEEP left foot  FOOT DRY AT ALL TIMES!!!!  2. CLEANSE ULCER WITH SALINE.  3. DAB DRY WITH GAUZE SPONGE.  4. APPLY A LIGHT AMOUNT OF Prisma Promogran and Iodosorb Gel TO BASE OF ULCER.  5. APPLY OUTER DRESSING AS INSTRUCTED.  6. WEAR SURGICAL SHOE/BOOT DAILY AT ALL TIMES. IF SUPPLIED, WEAR HEEL PROTECTORS AT ALL TIMES WHEN IN BED.  7. DO NOT WALK BAREFOOT!!!  8.  IF YOU EXPERIENCE ANY FEVER, CHILLS, NIGHTSWEATS, NAUSEA OR VOMITING, ELEVATED OR LOW BLOOD SUGARS, REPORT TO EMERGENCY ROOM.  9. IF YOU EXPERIENCE INCREASED REDNESS, PAIN, SWELLING, DISCOLORATION, ODOR, PUS, DRAINAGE OR WARMTH OF YOUR FOOT, REPORT TO EMERGENCY ROOM.

## 2019-09-18 NOTE — Progress Notes (Signed)
Happy to see him.  Thanks.

## 2019-09-18 NOTE — Progress Notes (Signed)
Subjective:  Patient ID: Caleb [name], male    DOB: 07/12/1950,  MRN: [id number]  69 y.o. male is seen for follow up ulcer L hallux, L 2nd toe, L 3rd toe, L 4th toe and R hallux  States he has been keeping feet dry as instructed  Prescribed wound care regimen: Silvadene cream dressings once daily for all digits.  Has not been wearing Darco shoe as he feels insert slides forward.  He feels his wound are continuing to progress. Caleb [name] states he will be quitting his job to spend more time with his grandkids.  He is still awaiting peripheral vascular intervention of left lower extremity by Dr. Jonathan [name], but will be having it done after his carotid blockages are addressed.  Patient denies any fever, chills, night sweats, nausea, or vomiting.  Past Medical History:  Diagnosis Date  . Diabetes mellitus without complication (HCC)   . Dyslipidemia 09/27/2012  . Erectile dysfunction 09/27/2012  . Heart murmur   . Hypercholesteremia   . Hyperlipidemia 08/12/2012  . Hypertension   . Hyponatremia 08/14/2012  . Neuropathy   . Pancreatitis   . Pancreatitis, acute 09/27/2012  . Vitamin D deficiency 06/23/2015     Past Surgical History:  Procedure Laterality Date  . ABDOMINAL AORTOGRAM W/LOWER EXTREMITY N/A 08/08/2019   Procedure: ABDOMINAL AORTOGRAM W/LOWER EXTREMITY;  Surgeon: [name], Caleb J, MD;  Location: [facility];  Service: Cardiovascular;  Laterality: N/A;  . [facility]  [facility]  . KNEE SURGERY    . PERIPHERAL VASCULAR INTERVENTION Right 08/08/2019   Procedure: PERIPHERAL VASCULAR INTERVENTION;  Surgeon: [name], Caleb J, MD;  Location: [facility];  Service: Cardiovascular;  Laterality: Right;     Current Outpatient Medications on File Prior to Visit  Medication Sig Dispense Refill  . aspirin EC 81 MG tablet Take 81 mg by mouth daily.    . Blood Glucose Monitoring Suppl (ONETOUCH VERIO) w/Device KIT 1 each by Does  not apply route 2 (two) times daily. Use as instructed twice daily, check fasting sugar in morning and check before bedtime. E11.8 1 kit 0  . clopidogrel (PLAVIX) 75 MG tablet Take 1 tablet (75 mg total) by mouth daily. 30 tablet 11  . collagenase (SANTYL) ointment Apply 1 application topically daily. Apply Santyl Ointment to scab on left 3rd and 4th digits. Measurements: Left 3rd toe 1.0 x 0.6 cm Left 4th toe: 2.0 x 1.0 cm (Patient taking differently: Apply 1 application topically daily as needed (Scab). Apply Santyl Ointment to scab on left 3rd and 4th digits. Measurements: Left 3rd toe 1.0 x 0.6 cm Left 4th toe: 2.0 x 1.0 cm) 30 g 0  . Continuous Blood Gluc Receiver (FREESTYLE LIBRE 14 DAY READER) DEVI 1 each by Does not apply route See admin instructions. Use as directed to check blood glucose 1 Device 0  . Continuous Blood Gluc Sensor (FREESTYLE LIBRE 14 DAY SENSOR) MISC INJECT IN THE SKIN EVERY 14 (FOURTEEN) DAYS. E11.69 100 each 2  . gabapentin (NEURONTIN) 300 MG capsule Take 1 capsule (300 mg total) by mouth 2 (two) times daily. (Patient taking differently: Take 300 mg by mouth daily as needed (pain). ) 180 capsule 1  . glucose blood test strip Use as instructed twice daily, check fasting sugar in morning and check before bedtime. E11.8 100 each 12  . HUMULIN 70/30 (70-30) 100 UNIT/ML injection INJECT 37 UNITS INTO THE SKIN 2 TIMES DAILY   WITH A MEAL. (Patient taking differently: Inject 37 Units into the skin 2 (two) times daily as needed (high blood sugar). Under 110 do not take) 60 mL 6  . lisinopril-hydrochlorothiazide (ZESTORETIC) 20-12.5 MG tablet Take 2 tablets by mouth daily. 180 tablet 1  . metFORMIN (GLUCOPHAGE) 1000 MG tablet Take 1 tablet (1,000 mg total) by mouth 2 (two) times daily with a meal. (Patient taking differently: Take 1,000 mg by mouth daily. ) 360 tablet 1  . metoprolol tartrate (LOPRESSOR) 100 MG tablet Take 1 tablet (100 mg total) by mouth 2 (two) times daily. 180 tablet 1    . ONETOUCH DELICA LANCETS 33G MISC Use as instructed twice daily, check fasting sugar in morning and check before bedtime. E11.8 100 each 12  . rosuvastatin (CRESTOR) 40 MG tablet Take 1 tablet (40 mg total) by mouth daily. To lower cholesterol 90 tablet 1  . sildenafil (VIAGRA) 50 MG tablet TAKE 2 TABLETS BY MOUTH DAILY AS NEEDED FOR ERECTILE DYSFUNCTION (Patient taking differently: Take 50-100 mg by mouth as needed for erectile dysfunction. ) 20 tablet 1  . silver sulfADIAZINE (SILVADENE) 1 % cream Apply to affected toes once daily and cover with dressing. 400 g 1  . Vitamin D, Ergocalciferol, (DRISDOL) 50000 units CAPS capsule Take 1 capsule (50,000 Units total) by mouth every 7 (seven) days. 12 capsule 0   Current Facility-Administered Medications on File Prior to Visit  Medication Dose Route Frequency Provider Last Rate Last Admin  . sodium chloride flush (NS) 0.9 % injection 3 mL  3 mL Intravenous Q12H [name], Caleb J, MD         No Known Allergies   Family History  Problem Relation Age of Onset  . CAD Father   . Hypertension Father   . Alcohol abuse Father        Cause of death  . Diabetes Mother   . Colon polyps Mother   . CAD Brother 50       CABG  . Colon cancer Neg Hx   . Esophageal cancer Neg Hx   . Rectal cancer Neg Hx   . Stomach cancer Neg Hx      Social History   Occupational History  . Occupation: Caregiver    Comment: Special needs  Tobacco Use  . Smoking status: Former Smoker    Types: Cigarettes  . Smokeless tobacco: Never Used  Vaping Use  . Vaping Use: Never used  Substance and Sexual Activity  . Alcohol use: Not Currently    Comment: rare  . Drug use: No  . Sexual activity: Not Currently     Immunization History  Administered Date(s) Administered  . Pneumococcal Conjugate-13 09/13/2016  . Pneumococcal Polysaccharide-23 08/15/2012, 01/14/2019  . Tdap 06/06/2016     Review of systems: Positive Findings in bold print.  Constitutional:   chills, fatigue, fever, sweats, weight change Communication: translator, sign language translator, hand writing, iPad/Android device Head: head injury Eyes: changes in vision, eye pain, glaucoma, cataracts, macular degeneration, diplopia, glare, light sensitivity, eyeglasses or contacts, blindness Ears nose mouth throat: hearing impaired, hearing aids,  ringing in ears, deaf, sign language,  vertigo, nosebleeds,  rhinitis,  cold sores, snoring, swollen glands Cardiovascular: HTN, edema, arrhythmia, pacemaker in place, defibrillator in place, chest pain/tightness, chronic anticoagulation, blood clot, heart failure, MI Peripheral Vascular: leg cramps, varicose veins, blood clots, lymphedema, varicosities Respiratory:  asthma, difficulty breathing, denies congestion, SOB, wheezing, cough, emphysema, oxygen dependent Gastrointestinal: change in appetite or weight, abdominal pain, constipation,   diarrhea, nausea, vomiting, vomiting blood, change in bowel habits, abdominal pain, jaundice, rectal bleeding, hemorrhoids, GERD Genitourinary:  nocturia,  pain on urination, polyuria,  blood in urine, Foley catheter, urinary urgency, ESRD on hemodialysis Musculoskeletal: amputation(s), cramping, stiff joints, painful joints, decreased joint motion, fractures, OA, gout, hemiplegia, paraplegia, uses cane, wheelchair bound, uses walker, uses rollator Skin: changes in toenails, color change, dryness, itching, mole changes,  rash, wound(s) Neurological: headaches, numbness in feet, paresthesias in feet, burning in feet, fainting,  seizures, change in speech. denies headaches, memory problems/poor historian, cerebral palsy, weakness, paralysis, CVA, TIA, tremors Endocrine: diabetes, hypothyroidism, hyperthyroidism,  goiter, dry mouth, flushing, heat intolerance,  cold intolerance,  excessive thirst, denies polyuria,  nocturia Hematological:  easy bleeding, excessive bleeding, easy bruising, enlarged lymph nodes, on long  term blood thinner, history of past transusions Allergy/immunological:  hives, eczema, frequent infections, multiple drug allergies, seasonal allergies, transplant recipient, multiple food allergies Psychiatric:  anxiety, depression, mood disorder, suicidal ideations, hallucinations, insomnia   Objective:  Physical Exam: Vitals:   09/17/19 0939  Temp: 98.6 F (37 C)     Mr. Cogbill is a pleasant 69 y.o. male WD, WN in NAD. AAO x 3.  Vascular Examination: Neurovascular status unchanged b/l lower extremities. Capillary refill time to digits <4 seconds b/l lower extremities. Palpable pedal pulses b/l LE. Pedal hair absent. Lower extremity skin temperature gradient within normal limits. No pain with calf compression b/l. No edema noted b/l lower extremities.  Dermatological Examination: Pedal skin with normal turgor, texture and tone bilaterally. No interdigital macerations bilaterally.           Wound Location: L hallux There is a minimal amount of devitalized hyperkeratotic tissue present in the wound. Predebridement Wound Measurement:  0.5  x 1.0  x 0.2 cm. Postdebridement Wound Measurement: 0.6 x 1.0 x 0.1 cm. Wound Base: Granular/Healthy Peri-wound: Calloused Exudate: None: wound tissue dry Blood Loss during debridement: 0 cc('s). Description of tissue removed from ulceration today:  Devitalized hyperkeratotic tissue. Sign(s) of clinical bacterial infection: no clinical signs of infection noted on examination today. Material in wound which inhibits healing/promotes adjacent tissue breakdown: devitalized hyperkeratotic tissue.  Wound Location: L 2nd toe distally is healed There is a minimal amount of devitalized hyperkeratotic tissue present in the wound. Predebridement Wound Measurement:  0.5  x 0.5  cm. Postdebridement Wound Measurement: Healed Wound Base: Epithelialized Peri-wound: Calloused Exudate: None: wound tissue dry Blood Loss during debridement: 0  cc('s). Description of tissue removed from ulceration today: devitalized hyperkeratosis. Sign(s) of clinical bacterial infection: no clinical signs of infection noted on examination today. Material in wound which inhibits healing/promotes adjacent tissue breakdown: devitalized hyperkeratosis.  Wound Location: L 3rd toe laterally (interdigitally) pressure wound, unstageable There is a minimal amount of devitalized tissue present in the wound. Wound Measurement:  0.5  x 0.5 x 0 cm. Wound Stage: unstageable Peri-wound: Normal Exudate: None: wound tissue dry Blood Loss during debridement: 0 cc('s). Description of tissue removed from ulceration today: None; it is a pressure wound. Sign(s) of clinical bacterial infection: no clinical signs of infection noted on examination today. Material in wound which inhibits healing/promotes adjacent tissue breakdown: N/A.  Wound Location: L 4th toe There is a moderate amount of devitalized tissue present in the wound. Predebridement Wound Measurement:  1.5 x 1.5 x 0.2 cm Postdebridement Wound Measurement: 1.5 x 1.5 x 1.0 cm with loose exposed bone fragment Wound Base: Mixed Granular/Fibrotic Peri-wound: Calloused Exudate: None: Minimal Blood Loss during debridement: <1 cc('s). Description of   tissue removed from ulceration today: bone Sign(s) of clinical bacterial infection: evidence of clinical osteomyelitis of DIPJ Material in wound which inhibits healing/promotes adjacent tissue breakdown: clinically exposed bone  Wound Location: R hallux There is a minimal amount of devitalized tissue present in the wound. Predebridement Wound Measurement:  0.5 x 0.8 cm Postdebridement Wound Measurement: healed Wound Base: Epithelial Peri-wound: Calloused Exudate: None: wound tissue dry Blood Loss during debridement: 0 cc('s). Description of tissue removed from ulceration today: devitalized hyperkeratosis. Sign(s) of clinical bacterial infection: no clinical  signs of infection noted on examination today. Material in wound which inhibits healing/promotes adjacent tissue breakdown: devitalized hyperkeratosis.  Unstageable decubitus ulceration lateral aspect right 3rd digit PIPJ. Measures 1.0 x 0.5 cm. No breaks in skin. No erythema, no edema, no drainage, no flocculence.  Musculoskeletal Examination: Normal muscle strength 5/5 to all lower extremity muscle groups bilaterally. No pain crepitus or joint limitation noted with ROM b/l. Hallux valgus with bunion deformity noted b/l lower extremities. Tailor's bunion deformity noted b/l lower extremities. Hammertoes noted to the b/l lower extremities.  Neurological Examination: Protective sensation diminished with 10g monofilament b/l.  Xray left foot: Significant for complete erosion of distal phalanx and distal 1/2 of middle phalanx. Proximal phalax remains intact. No evidence of gas in tissues.  VAS [place] ABI WITH/WO TBI  Result Date: 08/18/2019 LOWER EXTREMITY DOPPLER STUDY Indications: Ulceration, peripheral artery disease, and Patient indicates right              leg feels much better since procedure and no longer is getting              stabbing pains in right leg. He has non-healing ulcers on both sets              of toes and does not feel them due to neuropathy. High Risk         Hypertension, hyperlipidemia, Diabetes, past history of Factors:          smoking. Other Factors: SEE RIGHT LEG ARTERIAL DUPLEX REPORT.  Vascular Interventions: 08/08/2019 [facility] 1 directional atherectomy right                         popliteal artery and right SFA, drug-coated balloon                         angioplasty right popliteal artery and right SFA. Comparison Study: Prior ABI on 07/23/2019 Right .76 Left .86 Performing Technologist: Salvadore Dom RVT  Examination Guidelines: A complete evaluation includes at minimum, Doppler waveform signals and systolic blood pressure reading at the level of bilateral brachial, anterior  tibial, and posterior tibial arteries, when vessel segments are accessible. Bilateral testing is considered an integral part of a complete examination. Photoelectric Plethysmograph (PPG) waveforms and toe systolic pressure readings are included as required and additional duplex testing as needed. Limited examinations for reoccurring indications may be performed as noted.  ABI Findings: +---------+------------------+-----+-----------+--------+ Right    Rt Pressure (mmHg)IndexWaveform   Comment  +---------+------------------+-----+-----------+--------+ Brachial 161                                        +---------+------------------+-----+-----------+--------+ ATA      193               1.20 monophasic          +---------+------------------+-----+-----------+--------+  PTA      237               1.47 multiphasic         +---------+------------------+-----+-----------+--------+ PERO     171               1.06 monophasic          +---------+------------------+-----+-----------+--------+ Great Toe121               0.75 Abnormal            +---------+------------------+-----+-----------+--------+ +---------+------------------+-----+----------+-------+ Left     Lt Pressure (mmHg)IndexWaveform  Comment +---------+------------------+-----+----------+-------+ Brachial 159                                      +---------+------------------+-----+----------+-------+ ATA      97                0.60 monophasic        +---------+------------------+-----+----------+-------+ PTA      123               0.76 monophasic        +---------+------------------+-----+----------+-------+ PERO     103               0.64 monophasic        +---------+------------------+-----+----------+-------+ Great Toe61                0.38 Abnormal          +---------+------------------+-----+----------+-------+ +-------+-----------+-----------+------------+------------+ ABI/TBIToday's  ABIToday's TBIPrevious ABIPrevious TBI +-------+-----------+-----------+------------+------------+ Right  1.47 ([place])  .75        .76         .91          +-------+-----------+-----------+------------+------------+ Left   .76        .38        .74         .87          +-------+-----------+-----------+------------+------------+ Right ABIs appear increased compared to prior study on 07/23/2019. Left ABIs appear essentially unchanged compared to prior study on 07/23/2019.  Summary: Right: Resting right ankle-brachial index indicates noncompressible right lower extremity arteries. The right toe-brachial index is normal. Left: Resting left ankle-brachial index indicates moderate left lower extremity arterial disease. The left toe-brachial index is abnormal.  *See table(s) above for measurements and observations.  Suggest follow up study in 6 months. Electronically signed by Caleb Branch MD on 08/18/2019 at 10:33:21 AM.    Final   Assessment:   1. Osteomyelitis of fourth toe of left foot (HCC)   2. Diabetic ulcer of toe associated with type 2 diabetes mellitus, with fat layer exposed, unspecified laterality (HCC)   3. Pressure injury of skin of toe of left foot, unspecified injury stage   4. Critical lower limb ischemia   5. Diabetic peripheral neuropathy associated with type 2 diabetes mellitus (HCC)    Plan:  -Patient was evaluated and treated and all questions answered. See details above regarding debridements. -Informed him of osteomyelitis of left 4th toe and need for at least a partial digital amputation. No guarantee of healing amputation due to current blockages in limb, but he has healed multiple wounds on this foot. He related understanding. -Patient educated on diagnosis and treatment plan of routine ulcer debridement/wound care.  -Xray 3 views left foot taken and reviewed with Mr. [place] in office. -Dispensed silicone toe separators to address pressure spots between    toes. -Ulcerations debridement achieved utilizing sharp debridement with sterile scalpel blade. Type/amount of devitalized tissue removed:bone from left 4th digit sent to pathology.  -Today's ulcer size post-debridement: 1.5 x 1.5x 1.0 cm -Ulceration cleansed with wound cleanser. Prisma Promogran and Iodosorb Gel applied to base of ulceration left 4th digit and secured with light dressing. -Wound responded well to today's debridement. -Patient risk factors affecting healing of ulcer: -Frequency of debridement needed to achieve healing: weekly -[place] R Frix given written instructions on daily wound care for L hallux and L 4th toe ulceration. --Rx for Augmentin 500/125 mg, #30, to be taken three times per day for 10 days.. -Frequency of debridements needed to achieve healing: weekly. -Wound culture and sensitivity ordered today of left 4th digit ulcer. -Consultations ordered today: Follow up one week with Dr. Kevin Patel for osteomyelitis of left 4th toe.  Return in about 1 week (around 09/24/2019) for Follow up with Dr. Kevin Patel for osteomyelitis of left 4th toe.  Jennifer L Galaway, DPM 

## 2019-09-19 ENCOUNTER — Telehealth: Payer: Self-pay | Admitting: *Deleted

## 2019-09-19 DIAGNOSIS — Z794 Long term (current) use of insulin: Secondary | ICD-10-CM

## 2019-09-19 DIAGNOSIS — E1169 Type 2 diabetes mellitus with other specified complication: Secondary | ICD-10-CM

## 2019-09-19 LAB — TISSUE SPECIMEN

## 2019-09-19 LAB — PATHOLOGY REPORT

## 2019-09-19 NOTE — Telephone Encounter (Signed)
Patient was calling to f/u with his authorization for:   Free style Elenor Legato-  He needs more sensors.  He gets 2/ month  He needs it sent to the medical department of [facility] and not the pharmaceutical department of [employer].   He will replace the current one in 12 days.

## 2019-09-20 MED ORDER — FREESTYLE LIBRE 14 DAY SENSOR MISC: 3 refills | Status: DC

## 2019-09-20 MED ORDER — FREESTYLE LIBRE 14 DAY READER DEVI: 1.0000 | 0 refills | Status: DC

## 2019-09-20 NOTE — Telephone Encounter (Signed)
Insurance company was called and they state that patient needs to have a script for meter and sensor sent to DME company. Then the DME company will be responsible for calling the insurance company to obtain a prior Auth.  DX codes need to be on both scripts and testing frequency.

## 2019-09-20 NOTE — Telephone Encounter (Signed)
Orders has been faxed over to [employer] per insurance guidelines. 

## 2019-09-20 NOTE — Telephone Encounter (Signed)
Done. Please fax to [place]. Thanks.

## 2019-09-21 ENCOUNTER — Encounter: Payer: Self-pay | Admitting: Podiatry

## 2019-09-21 LAB — WOUND CULTURE
MICRO NUMBER:: 10648497
SPECIMEN QUALITY:: ADEQUATE

## 2019-09-24 NOTE — Telephone Encounter (Signed)
Patient was called and informed that script has been faxed over to [place] and they will obtain PA for patient.

## 2019-09-24 NOTE — Telephone Encounter (Signed)
Patient requesting call back regarding free style libre. He was advised by pharmacy to contact PCP.

## 2019-09-25 ENCOUNTER — Ambulatory Visit (INDEPENDENT_AMBULATORY_CARE_PROVIDER_SITE_OTHER): Payer: Medicare HMO | Admitting: Podiatry

## 2019-09-25 ENCOUNTER — Other Ambulatory Visit: Payer: Self-pay

## 2019-09-25 DIAGNOSIS — M869 Osteomyelitis, unspecified: Secondary | ICD-10-CM | POA: Diagnosis not present

## 2019-09-25 DIAGNOSIS — E11621 Type 2 diabetes mellitus with foot ulcer: Secondary | ICD-10-CM

## 2019-09-25 DIAGNOSIS — L97502 Non-pressure chronic ulcer of other part of unspecified foot with fat layer exposed: Secondary | ICD-10-CM | POA: Diagnosis not present

## 2019-09-25 MED ORDER — DOXYCYCLINE HYCLATE 100 MG PO TABS
100.0000 mg | ORAL_TABLET | Freq: Two times a day (BID) | ORAL | 0 refills | Status: DC
Start: 2019-09-25 — End: 2020-01-16

## 2019-09-25 MED FILL — DOXYCYCLINE HYCLATE 100 MG: 100 | 30 days supply | Qty: 60 | Fill #0

## 2019-09-26 ENCOUNTER — Telehealth: Payer: Self-pay | Admitting: *Deleted

## 2019-09-26 ENCOUNTER — Encounter: Payer: Self-pay | Admitting: Podiatry

## 2019-09-26 NOTE — Telephone Encounter (Signed)
-----   Message from Felipa Furnace, DPM sent at 09/26/2019  6:52 AM EDT ----- Regarding: Move up his appointment to be seen by Dr. Oneida Alar and Dr. Graylin Shiver,  Would you be able to move up his appointment by calling the office of both of the physicians listed above.  This is more of a time sensitive matter as patient has osteomyelitis of the fourth toe with poor circulation to the left lower extremity as well as blockages in his carotid artery.  He will need to have these cleared up before I can proceed with the amputation.  If any issues let me know.  Thank you

## 2019-09-26 NOTE — Progress Notes (Signed)
Subjective:  Patient ID: Caleb [name], male    DOB: 02-13-1951,  MRN: [id number]  Chief Complaint  Patient presents with  . Foot Ulcer    pt is here for 1 week diabetic ulcer f/u    69 y.o. male presents for wound care.  Patient presents for me for left foot diabetic ulceration of the fourth digit with concern for underlying osteomyelitis.  Patient states that he has been taking his prescription antibiotics as given to him by Dr. Adah Perl.  Patient was referred to me by Dr. Adah Perl for osteomyelitis.  He is being worked up for a vascular procedure to the left lower extremity however prior to proceeding with that he will need to have a procedure for his carotid artery blockages.  He denies any other acute complaints.  He would like to discuss other treatment options available for his fourth digit.   Review of Systems: Negative except as noted in the HPI. Denies N/V/F/Ch.  Past Medical History:  Diagnosis Date  . Diabetes mellitus without complication ([place])   . Dyslipidemia 09/27/2012  . Erectile dysfunction 09/27/2012  . Heart murmur   . Hypercholesteremia   . Hyperlipidemia 08/12/2012  . Hypertension   . Hyponatremia 08/14/2012  . Neuropathy   . Pancreatitis   . Pancreatitis, acute 09/27/2012  . Vitamin D deficiency 06/23/2015    Current Outpatient Medications:  .  amoxicillin-clavulanate (AUGMENTIN) 500-125 MG tablet, Take 1 tablet (500 mg total) by mouth 3 (three) times daily for 10 days., Disp: 30 tablet, Rfl: 0 .  aspirin EC 81 MG tablet, Take 81 mg by mouth daily., Disp: , Rfl:  .  Blood Glucose Monitoring Suppl (ONETOUCH VERIO) w/Device KIT, 1 each by Does not apply route 2 (two) times daily. Use as instructed twice daily, check fasting sugar in morning and check before bedtime. E11.8, Disp: 1 kit, Rfl: 0 .  clopidogrel (PLAVIX) 75 MG tablet, Take 1 tablet (75 mg total) by mouth daily., Disp: 30 tablet, Rfl: 11 .  collagenase (SANTYL) ointment, Apply 1 application topically  daily. Apply Santyl Ointment to scab on left 3rd and 4th digits. Measurements: Left 3rd toe 1.0 x 0.6 cm Left 4th toe: 2.0 x 1.0 cm (Patient taking differently: Apply 1 application topically daily as needed (Scab). Apply Santyl Ointment to scab on left 3rd and 4th digits. Measurements: Left 3rd toe 1.0 x 0.6 cm Left 4th toe: 2.0 x 1.0 cm), Disp: 30 g, Rfl: 0 .  Continuous Blood Gluc Receiver (FREESTYLE LIBRE 14 DAY READER) DEVI, 1 each by Does not apply route See admin instructions. Use as directed to check blood glucose, Disp: 1 each, Rfl: 0 .  Continuous Blood Gluc Sensor (FREESTYLE LIBRE 14 DAY SENSOR) MISC, INJECT IN THE SKIN EVERY 14 (FOURTEEN) DAYS. E11.69 Z79.4, Disp: 6 each, Rfl: 3 .  gabapentin (NEURONTIN) 300 MG capsule, Take 1 capsule (300 mg total) by mouth 2 (two) times daily. (Patient taking differently: Take 300 mg by mouth daily as needed (pain). ), Disp: 180 capsule, Rfl: 1 .  glucose blood test strip, Use as instructed twice daily, check fasting sugar in morning and check before bedtime. E11.8, Disp: 100 each, Rfl: 12 .  HUMULIN 70/30 (70-30) 100 UNIT/ML injection, INJECT 37 UNITS INTO THE SKIN 2 TIMES DAILY WITH A MEAL. (Patient taking differently: Inject 37 Units into the skin 2 (two) times daily as needed (high blood sugar). Under 110 do not take), Disp: 60 mL, Rfl: 6 .  lisinopril-hydrochlorothiazide (ZESTORETIC) 20-12.5 MG tablet,  Take 2 tablets by mouth daily., Disp: 180 tablet, Rfl: 1 .  metFORMIN (GLUCOPHAGE) 1000 MG tablet, Take 1 tablet (1,000 mg total) by mouth 2 (two) times daily with a meal. (Patient taking differently: Take 1,000 mg by mouth daily. ), Disp: 360 tablet, Rfl: 1 .  metoprolol tartrate (LOPRESSOR) 100 MG tablet, Take 1 tablet (100 mg total) by mouth 2 (two) times daily., Disp: 180 tablet, Rfl: 1 .  ONETOUCH DELICA LANCETS 31V MISC, Use as instructed twice daily, check fasting sugar in morning and check before bedtime. E11.8, Disp: 100 each, Rfl: 12 .   rosuvastatin (CRESTOR) 40 MG tablet, Take 1 tablet (40 mg total) by mouth daily. To lower cholesterol, Disp: 90 tablet, Rfl: 1 .  sildenafil (VIAGRA) 50 MG tablet, TAKE 2 TABLETS BY MOUTH DAILY AS NEEDED FOR ERECTILE DYSFUNCTION (Patient taking differently: Take 50-100 mg by mouth as needed for erectile dysfunction. ), Disp: 20 tablet, Rfl: 1 .  silver sulfADIAZINE (SILVADENE) 1 % cream, Apply to affected toes once daily and cover with dressing., Disp: 400 g, Rfl: 1 .  Vitamin D, Ergocalciferol, (DRISDOL) 50000 units CAPS capsule, Take 1 capsule (50,000 Units total) by mouth every 7 (seven) days., Disp: 12 capsule, Rfl: 0 .  doxycycline (VIBRA-TABS) 100 MG tablet, Take 1 tablet (100 mg total) by mouth 2 (two) times daily., Disp: 60 tablet, Rfl: 0  Current Facility-Administered Medications:  .  sodium chloride flush (NS) 0.9 % injection 3 mL, 3 mL, Intravenous, Q12H, Gwenlyn Found Pearletha Forge, MD  Social History   Tobacco Use  Smoking Status Former Smoker  . Types: Cigarettes  Smokeless Tobacco Never Used    No Known Allergies Objective:  There were no vitals filed for this visit. There is no height or weight on file to calculate BMI. Constitutional Well developed. Well nourished.  Vascular Dorsalis pedis pulses non palpable bilaterally. Posterior tibial pulses non palpable bilaterally. Capillary refill normal to all digits.  No cyanosis or clubbing noted. Pedal hair growth normal.  Neurologic Normal speech. Oriented to person, place, and time. Protective sensation absent  Dermatologic Wound Location: Left fourth digit ulceration probing down to bone with underlying osteomyelitis.  No purulent drainage noted no cellulitis or redness noted.  No malodor present. Wound Base: Mixed Granular/Fibrotic Peri-wound: Calloused Exudate: None: wound tissue dry Wound Measurements: -See below  Orthopedic: No pain to palpation either foot.   Radiographs: 3 views of skeletally mature adult fourth digit  prior x-rays were reviewed by me.  Cortical destruction noted of the left fourth digit consistent with osteomyelitis.  No osteomyelitis noted at the other digits.  We will plan on getting serial x-ray to continue to monitor osteomyelitis. Assessment:   1. Osteomyelitis of fourth toe of left foot ([place])   2. Diabetic ulcer of toe associated with type 2 diabetes mellitus, with fat layer exposed, unspecified laterality ([place])    Plan:  Patient was evaluated and treated and all questions answered.  Ulcer left fourth digit with underlying osteomyelitis. -Dressed with Betadine wet-to-dry, DSD. -Continue off-loading with surgical shoe. -Patient is a high risk of losing the fourth digit however in the setting of poor vascular flow he is a high risk of losing much further than just the digit.  For now patient only has osteomyelitis without any other clinical signs of infection such as purulent drainage or cellulitis/redness.  I will continue to monitor the progression of osteomyelitis until his vascular flow has improved.  If this becomes worsening infection then I will plan  on doing an amputation prior to vascular intervention.  However at this time I would like the patient to be stabilized from his carotid artery as well as his left vascular flow. -Doxycycline was dispensed I will keep him on it indefinitely until surgical amputation. -Continue performing local wound care with Betadine wet-to-dry dressings. -Wound cultures were reviewed by me however they were taken in clinic and a nonsterile way therefore I I will not place a lot of emphasis on the culture.   No follow-ups on file.

## 2019-09-26 NOTE — Telephone Encounter (Signed)
Yeah I wanted to move up his appointment to be seen by a specialist to see if there can do any kind of intervention on the carotid arteries.

## 2019-09-26 NOTE — Telephone Encounter (Signed)
I spoke with [facility] - Caleb [name] states they do not have any earlier appts, but pt can call to see if there have been any cancellations. I informed pt of [facility] - Sonya's suggestions. Pt states he will call them every other day.

## 2019-09-26 NOTE — Telephone Encounter (Signed)
Pt called back, still needs refill/has not been able to check blood sugar. Please advise Best contact: [phone]  Has not checked blood sugar since Saturday. Is missing his [facility], patient is very concerned his blood sugar drops considerably low when he sleeps.

## 2019-09-27 NOTE — Telephone Encounter (Signed)
Call has been placed to [facility] and they have started the process to get the patient his testing supplies.  Patient has been informed that [facility] will reach out to him in the next 5-7 days to complete the process.

## 2019-10-01 ENCOUNTER — Ambulatory Visit: Payer: Medicare HMO | Admitting: Podiatry

## 2019-10-04 ENCOUNTER — Ambulatory Visit (INDEPENDENT_AMBULATORY_CARE_PROVIDER_SITE_OTHER): Payer: Medicare HMO | Admitting: Podiatry

## 2019-10-04 ENCOUNTER — Encounter: Payer: Self-pay | Admitting: Podiatry

## 2019-10-04 ENCOUNTER — Other Ambulatory Visit: Payer: Self-pay

## 2019-10-04 DIAGNOSIS — M869 Osteomyelitis, unspecified: Secondary | ICD-10-CM | POA: Diagnosis not present

## 2019-10-04 DIAGNOSIS — E11621 Type 2 diabetes mellitus with foot ulcer: Secondary | ICD-10-CM

## 2019-10-04 DIAGNOSIS — L97502 Non-pressure chronic ulcer of other part of unspecified foot with fat layer exposed: Secondary | ICD-10-CM | POA: Diagnosis not present

## 2019-10-04 NOTE — Progress Notes (Signed)
Subjective:  Patient ID: Caleb [name], male    DOB: 10-26-1950,  MRN: [id number]  Chief Complaint  Patient presents with  . Diabetic Ulcer    multiple bilateral toes, xrays done last week    69 y.o. male presents for wound care.  Patient presents with a follow-up of left foot diabetic ulceration with fourth digit osteomyelitis.  Patient states that it is still very stable.  He has been doing weight Betadine wet-to-dry dressing changes not getting the foot wet.  He is being worked up for vascular procedure to the left lower extremity however he will need to have procedure for carotid artery blockages as well.  He denies any other acute complaints.   Review of Systems: Negative except as noted in the HPI. Denies N/V/F/Ch.  Past Medical History:  Diagnosis Date  . Diabetes mellitus without complication ([place])   . Dyslipidemia 09/27/2012  . Erectile dysfunction 09/27/2012  . Heart murmur   . Hypercholesteremia   . Hyperlipidemia 08/12/2012  . Hypertension   . Hyponatremia 08/14/2012  . Neuropathy   . Pancreatitis   . Pancreatitis, acute 09/27/2012  . Vitamin D deficiency 06/23/2015    Current Outpatient Medications:  .  aspirin EC 81 MG tablet, Take 81 mg by mouth daily., Disp: , Rfl:  .  Blood Glucose Monitoring Suppl (ONETOUCH VERIO) w/Device KIT, 1 each by Does not apply route 2 (two) times daily. Use as instructed twice daily, check fasting sugar in morning and check before bedtime. E11.8, Disp: 1 kit, Rfl: 0 .  clopidogrel (PLAVIX) 75 MG tablet, Take 1 tablet (75 mg total) by mouth daily., Disp: 30 tablet, Rfl: 11 .  collagenase (SANTYL) ointment, Apply 1 application topically daily. Apply Santyl Ointment to scab on left 3rd and 4th digits. Measurements: Left 3rd toe 1.0 x 0.6 cm Left 4th toe: 2.0 x 1.0 cm (Patient taking differently: Apply 1 application topically daily as needed (Scab). Apply Santyl Ointment to scab on left 3rd and 4th digits. Measurements: Left 3rd toe 1.0 x 0.6 cm  Left 4th toe: 2.0 x 1.0 cm), Disp: 30 g, Rfl: 0 .  Continuous Blood Gluc Receiver (FREESTYLE LIBRE 14 DAY READER) DEVI, 1 each by Does not apply route See admin instructions. Use as directed to check blood glucose, Disp: 1 each, Rfl: 0 .  Continuous Blood Gluc Sensor (FREESTYLE LIBRE 14 DAY SENSOR) MISC, INJECT IN THE SKIN EVERY 14 (FOURTEEN) DAYS. E11.69 Z79.4, Disp: 6 each, Rfl: 3 .  doxycycline (VIBRA-TABS) 100 MG tablet, Take 1 tablet (100 mg total) by mouth 2 (two) times daily., Disp: 60 tablet, Rfl: 0 .  gabapentin (NEURONTIN) 300 MG capsule, Take 1 capsule (300 mg total) by mouth 2 (two) times daily. (Patient taking differently: Take 300 mg by mouth daily as needed (pain). ), Disp: 180 capsule, Rfl: 1 .  glucose blood test strip, Use as instructed twice daily, check fasting sugar in morning and check before bedtime. E11.8, Disp: 100 each, Rfl: 12 .  HUMULIN 70/30 (70-30) 100 UNIT/ML injection, INJECT 37 UNITS INTO THE SKIN 2 TIMES DAILY WITH A MEAL. (Patient taking differently: Inject 37 Units into the skin 2 (two) times daily as needed (high blood sugar). Under 110 do not take), Disp: 60 mL, Rfl: 6 .  lisinopril-hydrochlorothiazide (ZESTORETIC) 20-12.5 MG tablet, Take 2 tablets by mouth daily., Disp: 180 tablet, Rfl: 1 .  metFORMIN (GLUCOPHAGE) 1000 MG tablet, Take 1 tablet (1,000 mg total) by mouth 2 (two) times daily with a meal. (Patient  taking differently: Take 1,000 mg by mouth daily. ), Disp: 360 tablet, Rfl: 1 .  metoprolol tartrate (LOPRESSOR) 100 MG tablet, Take 1 tablet (100 mg total) by mouth 2 (two) times daily., Disp: 180 tablet, Rfl: 1 .  ONETOUCH DELICA LANCETS 43P MISC, Use as instructed twice daily, check fasting sugar in morning and check before bedtime. E11.8, Disp: 100 each, Rfl: 12 .  rosuvastatin (CRESTOR) 40 MG tablet, Take 1 tablet (40 mg total) by mouth daily. To lower cholesterol, Disp: 90 tablet, Rfl: 1 .  sildenafil (VIAGRA) 50 MG tablet, TAKE 2 TABLETS BY MOUTH DAILY  AS NEEDED FOR ERECTILE DYSFUNCTION (Patient taking differently: Take 50-100 mg by mouth as needed for erectile dysfunction. ), Disp: 20 tablet, Rfl: 1 .  silver sulfADIAZINE (SILVADENE) 1 % cream, Apply to affected toes once daily and cover with dressing., Disp: 400 g, Rfl: 1 .  Vitamin D, Ergocalciferol, (DRISDOL) 50000 units CAPS capsule, Take 1 capsule (50,000 Units total) by mouth every 7 (seven) days., Disp: 12 capsule, Rfl: 0  Current Facility-Administered Medications:  .  sodium chloride flush (NS) 0.9 % injection 3 mL, 3 mL, Intravenous, Q12H, Gwenlyn Found Pearletha Forge, MD  Social History   Tobacco Use  Smoking Status Former Smoker  . Types: Cigarettes  Smokeless Tobacco Never Used    No Known Allergies Objective:  There were no vitals filed for this visit. There is no height or weight on file to calculate BMI. Constitutional Well developed. Well nourished.  Vascular Dorsalis pedis pulses non palpable bilaterally. Posterior tibial pulses non palpable bilaterally. Capillary refill normal to all digits.  No cyanosis or clubbing noted. Pedal hair growth normal.  Neurologic Normal speech. Oriented to person, place, and time. Protective sensation absent  Dermatologic Wound Location: Left fourth digit ulceration probing down to bone with underlying osteomyelitis.  No purulent drainage noted no cellulitis or redness noted.  No malodor present. Wound Base: Mixed Granular/Fibrotic Peri-wound: Calloused Exudate: None: wound tissue dry Wound Measurements: -See below  Orthopedic: No pain to palpation either foot.   Radiographs: 3 views of skeletally mature adult fourth digit prior x-rays were reviewed by me.  Cortical destruction noted of the left fourth digit consistent with osteomyelitis.  No osteomyelitis noted at the other digits.  We will plan on getting serial x-ray to continue to monitor osteomyelitis. Assessment:   1. Osteomyelitis of fourth toe of left foot ([place])   2. Diabetic  ulcer of toe associated with type 2 diabetes mellitus, with fat layer exposed, unspecified laterality ([place])    Plan:  Patient was evaluated and treated and all questions answered.  Ulcer left fourth digit with underlying osteomyelitis. -Dressed with Betadine wet-to-dry, DSD. -Continue off-loading with surgical shoe. -Patient is a high risk of losing the fourth digit however in the setting of poor vascular flow he is a high risk of losing much further than just the digit.  For now patient only has osteomyelitis without any other clinical signs of infection such as purulent drainage or cellulitis/redness.  I will continue to monitor the progression of osteomyelitis until his vascular flow has improved.  If this becomes worsening infection then I will plan on doing an amputation prior to vascular intervention.  However at this time I would like the patient to be stabilized from his carotid artery as well as his left vascular flow. -Doxycycline was dispensed I will keep him on it indefinitely until surgical amputation. -Continue performing local wound care with Betadine wet-to-dry dressings. -Wound cultures were reviewed  by me however they were taken in clinic and a nonsterile way therefore I I will not place a lot of emphasis on the culture.   Return in about 1 week (around 10/11/2019) for diabetic ulcer.

## 2019-10-07 MED FILL — ROSUVASTATIN CALCIUM 40 MG: 40 | 90 days supply | Qty: 90 | Fill #2

## 2019-10-07 MED FILL — LISINOPRIL-HCTZ 20-12.5 MG: 20-12.5 | 90 days supply | Qty: 180 | Fill #2

## 2019-10-07 MED FILL — CLOPIDOGREL 75 MG TABLET: 75 | 90 days supply | Qty: 90 | Fill #2

## 2019-10-16 DIAGNOSIS — E119 Type 2 diabetes mellitus without complications: Secondary | ICD-10-CM | POA: Diagnosis not present

## 2019-10-18 ENCOUNTER — Ambulatory Visit (INDEPENDENT_AMBULATORY_CARE_PROVIDER_SITE_OTHER): Payer: Medicare HMO | Admitting: Podiatry

## 2019-10-18 ENCOUNTER — Other Ambulatory Visit: Payer: Self-pay

## 2019-10-18 DIAGNOSIS — E11621 Type 2 diabetes mellitus with foot ulcer: Secondary | ICD-10-CM | POA: Diagnosis not present

## 2019-10-18 DIAGNOSIS — M869 Osteomyelitis, unspecified: Secondary | ICD-10-CM | POA: Diagnosis not present

## 2019-10-18 DIAGNOSIS — L97502 Non-pressure chronic ulcer of other part of unspecified foot with fat layer exposed: Secondary | ICD-10-CM | POA: Diagnosis not present

## 2019-10-22 ENCOUNTER — Encounter: Payer: Self-pay | Admitting: Podiatry

## 2019-10-22 MED FILL — metFORMIN HCL 1000 MG TABS: 1000 | 30 days supply | Qty: 60 | Fill #1

## 2019-10-22 NOTE — Progress Notes (Signed)
Subjective:  Patient ID: Caleb [name], male    DOB: January 03, 1951,  MRN: [id number]  Chief Complaint  Patient presents with  . Foot Pain    pt is here for a f/u of possible osteomyelitis of the left foot 4th toe    69 y.o. male presents for wound care.  Patient presents with follow-up of left diabetic foot ulceration with fourth digit osteomyelitis.  Patient states is looking a lot better but is staying very dry.  Patient has been applying Betadine to the toe.  For now he is still awaiting his other procedures.  He denies any other acute complaints   Review of Systems: Negative except as noted in the HPI. Denies N/V/F/Ch.  Past Medical History:  Diagnosis Date  . Diabetes mellitus without complication ([place])   . Dyslipidemia 09/27/2012  . Erectile dysfunction 09/27/2012  . Heart murmur   . Hypercholesteremia   . Hyperlipidemia 08/12/2012  . Hypertension   . Hyponatremia 08/14/2012  . Neuropathy   . Pancreatitis   . Pancreatitis, acute 09/27/2012  . Vitamin D deficiency 06/23/2015    Current Outpatient Medications:  .  aspirin EC 81 MG tablet, Take 81 mg by mouth daily., Disp: , Rfl:  .  Blood Glucose Monitoring Suppl (ONETOUCH VERIO) w/Device KIT, 1 each by Does not apply route 2 (two) times daily. Use as instructed twice daily, check fasting sugar in morning and check before bedtime. E11.8, Disp: 1 kit, Rfl: 0 .  clopidogrel (PLAVIX) 75 MG tablet, Take 1 tablet (75 mg total) by mouth daily., Disp: 30 tablet, Rfl: 11 .  collagenase (SANTYL) ointment, Apply 1 application topically daily. Apply Santyl Ointment to scab on left 3rd and 4th digits. Measurements: Left 3rd toe 1.0 x 0.6 cm Left 4th toe: 2.0 x 1.0 cm (Patient taking differently: Apply 1 application topically daily as needed (Scab). Apply Santyl Ointment to scab on left 3rd and 4th digits. Measurements: Left 3rd toe 1.0 x 0.6 cm Left 4th toe: 2.0 x 1.0 cm), Disp: 30 g, Rfl: 0 .  Continuous Blood Gluc Receiver (FREESTYLE LIBRE 14  DAY READER) DEVI, 1 each by Does not apply route See admin instructions. Use as directed to check blood glucose, Disp: 1 each, Rfl: 0 .  Continuous Blood Gluc Sensor (FREESTYLE LIBRE 14 DAY SENSOR) MISC, INJECT IN THE SKIN EVERY 14 (FOURTEEN) DAYS. E11.69 Z79.4, Disp: 6 each, Rfl: 3 .  doxycycline (VIBRA-TABS) 100 MG tablet, Take 1 tablet (100 mg total) by mouth 2 (two) times daily., Disp: 60 tablet, Rfl: 0 .  gabapentin (NEURONTIN) 300 MG capsule, Take 1 capsule (300 mg total) by mouth 2 (two) times daily. (Patient taking differently: Take 300 mg by mouth daily as needed (pain). ), Disp: 180 capsule, Rfl: 1 .  glucose blood test strip, Use as instructed twice daily, check fasting sugar in morning and check before bedtime. E11.8, Disp: 100 each, Rfl: 12 .  HUMULIN 70/30 (70-30) 100 UNIT/ML injection, INJECT 37 UNITS INTO THE SKIN 2 TIMES DAILY WITH A MEAL. (Patient taking differently: Inject 37 Units into the skin 2 (two) times daily as needed (high blood sugar). Under 110 do not take), Disp: 60 mL, Rfl: 6 .  lisinopril-hydrochlorothiazide (ZESTORETIC) 20-12.5 MG tablet, Take 2 tablets by mouth daily., Disp: 180 tablet, Rfl: 1 .  metFORMIN (GLUCOPHAGE) 1000 MG tablet, Take 1 tablet (1,000 mg total) by mouth 2 (two) times daily with a meal. (Patient taking differently: Take 1,000 mg by mouth daily. ), Disp: 360 tablet,  Rfl: 1 .  metoprolol tartrate (LOPRESSOR) 100 MG tablet, Take 1 tablet (100 mg total) by mouth 2 (two) times daily., Disp: 180 tablet, Rfl: 1 .  ONETOUCH DELICA LANCETS 62I MISC, Use as instructed twice daily, check fasting sugar in morning and check before bedtime. E11.8, Disp: 100 each, Rfl: 12 .  rosuvastatin (CRESTOR) 40 MG tablet, Take 1 tablet (40 mg total) by mouth daily. To lower cholesterol, Disp: 90 tablet, Rfl: 1 .  sildenafil (VIAGRA) 50 MG tablet, TAKE 2 TABLETS BY MOUTH DAILY AS NEEDED FOR ERECTILE DYSFUNCTION (Patient taking differently: Take 50-100 mg by mouth as needed for  erectile dysfunction. ), Disp: 20 tablet, Rfl: 1 .  silver sulfADIAZINE (SILVADENE) 1 % cream, Apply to affected toes once daily and cover with dressing., Disp: 400 g, Rfl: 1 .  Vitamin D, Ergocalciferol, (DRISDOL) 50000 units CAPS capsule, Take 1 capsule (50,000 Units total) by mouth every 7 (seven) days., Disp: 12 capsule, Rfl: 0  Current Facility-Administered Medications:  .  sodium chloride flush (NS) 0.9 % injection 3 mL, 3 mL, Intravenous, Q12H, Caleb Found Pearletha Forge, MD  Social History   Tobacco Use  Smoking Status Former Smoker  . Types: Cigarettes  Smokeless Tobacco Never Used    No Known Allergies Objective:  There were no vitals filed for this visit. There is no height or weight on file to calculate BMI. Constitutional Well developed. Well nourished.  Vascular Dorsalis pedis pulses non palpable bilaterally. Posterior tibial pulses non palpable bilaterally. Capillary refill normal to all digits.  No cyanosis or clubbing noted. Pedal hair growth normal.  Neurologic Normal speech. Oriented to person, place, and time. Protective sensation absent  Dermatologic Wound Location: Left fourth digit ulceration probing down to bone with underlying osteomyelitis.  No purulent drainage noted no cellulitis or redness noted.  No malodor present. Wound Base: Mixed Granular/Fibrotic Peri-wound: Calloused Exudate: None: wound tissue dry Wound Measurements: -See below  Orthopedic: No pain to palpation either foot.   Radiographs: 3 views of skeletally mature adult fourth digit prior x-rays were reviewed by me.  Cortical destruction noted of the left fourth digit consistent with osteomyelitis.  No osteomyelitis noted at the other digits.  We will plan on getting serial x-ray to continue to monitor osteomyelitis. Assessment:   1. Osteomyelitis of fourth toe of left foot (Caleb [name])   2. Diabetic ulcer of toe associated with type 2 diabetes mellitus, with fat layer exposed, unspecified laterality  (Caleb [name])    Plan:  Patient was evaluated and treated and all questions answered.  Ulcer left fourth digit with underlying osteomyelitis -Dressed with Betadine wet-to-dry, DSD. -Continue off-loading with surgical shoe. -Patient is a high risk of losing the fourth digit however in the setting of poor vascular flow he is a high risk of losing much further than just the digit.  For now patient only has osteomyelitis without any other clinical signs of infection such as purulent drainage or cellulitis/redness.  I will continue to monitor the progression of osteomyelitis until his vascular flow has improved.  If this becomes worsening infection then I will plan on doing an amputation prior to vascular intervention.  However at this time I would like the patient to be stabilized from his carotid artery as well as his left vascular flow. -Doxycycline was dispensed I will keep him on it indefinitely until surgical amputation. -Continue performing local wound care with Betadine wet-to-dry dressings. -Wound cultures were reviewed by me however they were taken in clinic and a nonsterile way  therefore I I will not place a lot of emphasis on the culture.   No follow-ups on file.

## 2019-10-23 ENCOUNTER — Other Ambulatory Visit: Payer: Self-pay

## 2019-10-23 ENCOUNTER — Ambulatory Visit (INDEPENDENT_AMBULATORY_CARE_PROVIDER_SITE_OTHER): Payer: Medicare HMO | Admitting: Podiatry

## 2019-10-23 DIAGNOSIS — M869 Osteomyelitis, unspecified: Secondary | ICD-10-CM

## 2019-10-23 DIAGNOSIS — L97502 Non-pressure chronic ulcer of other part of unspecified foot with fat layer exposed: Secondary | ICD-10-CM | POA: Diagnosis not present

## 2019-10-23 DIAGNOSIS — I6529 Occlusion and stenosis of unspecified carotid artery: Secondary | ICD-10-CM

## 2019-10-23 DIAGNOSIS — E11621 Type 2 diabetes mellitus with foot ulcer: Secondary | ICD-10-CM

## 2019-10-24 ENCOUNTER — Encounter: Payer: Self-pay | Admitting: Podiatry

## 2019-10-24 NOTE — Progress Notes (Signed)
Subjective:  Patient ID: Caleb [name], male    DOB: [birth year],  MRN: [id number]  Chief Complaint  Patient presents with  . Wound Check    pt is here for a wound check f/u of the left second toe    69 y.o. male presents for wound care.  Patient presents with follow-up of left diabetic foot ulceration with fourth digit osteomyelitis.  Patient is here for dressing change as his daughter is not able to do this week.  He denies any other acute complaints.  He denies any pain to the fourth toe.  The fourth toe is dry stable.   Review of Systems: Negative except as noted in the HPI. Denies N/V/F/Ch.  Past Medical History:  Diagnosis Date  . Diabetes mellitus without complication ([place])   . Dyslipidemia 09/27/2012  . Erectile dysfunction 09/27/2012  . Heart murmur   . Hypercholesteremia   . Hyperlipidemia 08/12/2012  . Hypertension   . Hyponatremia 08/14/2012  . Neuropathy   . Pancreatitis   . Pancreatitis, acute 09/27/2012  . Vitamin D deficiency 06/23/2015    Current Outpatient Medications:  .  aspirin EC 81 MG tablet, Take 81 mg by mouth daily., Disp: , Rfl:  .  Blood Glucose Monitoring Suppl (ONETOUCH VERIO) w/Device KIT, 1 each by Does not apply route 2 (two) times daily. Use as instructed twice daily, check fasting sugar in morning and check before bedtime. E11.8, Disp: 1 kit, Rfl: 0 .  clopidogrel (PLAVIX) 75 MG tablet, Take 1 tablet (75 mg total) by mouth daily., Disp: 30 tablet, Rfl: 11 .  collagenase (SANTYL) ointment, Apply 1 application topically daily. Apply Santyl Ointment to scab on left 3rd and 4th digits. Measurements: Left 3rd toe 1.0 x 0.6 cm Left 4th toe: 2.0 x 1.0 cm (Patient taking differently: Apply 1 application topically daily as needed (Scab). Apply Santyl Ointment to scab on left 3rd and 4th digits. Measurements: Left 3rd toe 1.0 x 0.6 cm Left 4th toe: 2.0 x 1.0 cm), Disp: 30 g, Rfl: 0 .  Continuous Blood Gluc Receiver (FREESTYLE LIBRE 14 DAY READER) DEVI, 1 each by  Does not apply route See admin instructions. Use as directed to check blood glucose, Disp: 1 each, Rfl: 0 .  Continuous Blood Gluc Sensor (FREESTYLE LIBRE 14 DAY SENSOR) MISC, INJECT IN THE SKIN EVERY 14 (FOURTEEN) DAYS. E11.69 Z79.4, Disp: 6 each, Rfl: 3 .  doxycycline (VIBRA-TABS) 100 MG tablet, Take 1 tablet (100 mg total) by mouth 2 (two) times daily., Disp: 60 tablet, Rfl: 0 .  gabapentin (NEURONTIN) 300 MG capsule, Take 1 capsule (300 mg total) by mouth 2 (two) times daily. (Patient taking differently: Take 300 mg by mouth daily as needed (pain). ), Disp: 180 capsule, Rfl: 1 .  glucose blood test strip, Use as instructed twice daily, check fasting sugar in morning and check before bedtime. E11.8, Disp: 100 each, Rfl: 12 .  HUMULIN 70/30 (70-30) 100 UNIT/ML injection, INJECT 37 UNITS INTO THE SKIN 2 TIMES DAILY WITH A MEAL. (Patient taking differently: Inject 37 Units into the skin 2 (two) times daily as needed (high blood sugar). Under 110 do not take), Disp: 60 mL, Rfl: 6 .  lisinopril-hydrochlorothiazide (ZESTORETIC) 20-12.5 MG tablet, Take 2 tablets by mouth daily., Disp: 180 tablet, Rfl: 1 .  metFORMIN (GLUCOPHAGE) 1000 MG tablet, Take 1 tablet (1,000 mg total) by mouth 2 (two) times daily with a meal. (Patient taking differently: Take 1,000 mg by mouth daily. ), Disp: 360 tablet, Rfl:  1 .  metoprolol tartrate (LOPRESSOR) 100 MG tablet, Take 1 tablet (100 mg total) by mouth 2 (two) times daily., Disp: 180 tablet, Rfl: 1 .  ONETOUCH DELICA LANCETS 78H MISC, Use as instructed twice daily, check fasting sugar in morning and check before bedtime. E11.8, Disp: 100 each, Rfl: 12 .  rosuvastatin (CRESTOR) 40 MG tablet, Take 1 tablet (40 mg total) by mouth daily. To lower cholesterol, Disp: 90 tablet, Rfl: 1 .  sildenafil (VIAGRA) 50 MG tablet, TAKE 2 TABLETS BY MOUTH DAILY AS NEEDED FOR ERECTILE DYSFUNCTION (Patient taking differently: Take 50-100 mg by mouth as needed for erectile dysfunction. ),  Disp: 20 tablet, Rfl: 1 .  silver sulfADIAZINE (SILVADENE) 1 % cream, Apply to affected toes once daily and cover with dressing., Disp: 400 g, Rfl: 1 .  Vitamin D, Ergocalciferol, (DRISDOL) 50000 units CAPS capsule, Take 1 capsule (50,000 Units total) by mouth every 7 (seven) days., Disp: 12 capsule, Rfl: 0  Current Facility-Administered Medications:  .  sodium chloride flush (NS) 0.9 % injection 3 mL, 3 mL, Intravenous, Q12H, Gwenlyn Found Pearletha Forge, MD  Social History   Tobacco Use  Smoking Status Former Smoker  . Types: Cigarettes  Smokeless Tobacco Never Used    No Known Allergies Objective:  There were no vitals filed for this visit. There is no height or weight on file to calculate BMI. Constitutional Well developed. Well nourished.  Vascular Dorsalis pedis pulses non palpable bilaterally. Posterior tibial pulses non palpable bilaterally. Capillary refill normal to all digits.  No cyanosis or clubbing noted. Pedal hair growth normal.  Neurologic Normal speech. Oriented to person, place, and time. Protective sensation absent  Dermatologic Wound Location: Left fourth digit ulceration probing down to bone with underlying osteomyelitis.  No purulent drainage noted no cellulitis or redness noted.  No malodor present. Wound Base: Mixed Granular/Fibrotic Peri-wound: Calloused Exudate: None: wound tissue dry Wound Measurements: -See below  Orthopedic: No pain to palpation either foot.   Radiographs: 3 views of skeletally mature adult fourth digit prior x-rays were reviewed by me.  Cortical destruction noted of the left fourth digit consistent with osteomyelitis.  No osteomyelitis noted at the other digits.  We will plan on getting serial x-ray to continue to monitor osteomyelitis. Assessment:   No diagnosis found. Plan:  Patient was evaluated and treated and all questions answered.  Ulcer left fourth digit with underlying osteomyelitis -Dressed with Betadine wet-to-dry,  DSD. -Continue off-loading with surgical shoe. -Patient is a high risk of losing the fourth digit however in the setting of poor vascular flow he is a high risk of losing much further than just the digit.  For now patient only has osteomyelitis without any other clinical signs of infection such as purulent drainage or cellulitis/redness.  I will continue to monitor the progression of osteomyelitis until his vascular flow has improved.  If this becomes worsening infection then I will plan on doing an amputation prior to vascular intervention.  However at this time I would like the patient to be stabilized from his carotid artery as well as his left vascular flow. -Doxycycline was dispensed I will keep him on it indefinitely until surgical amputation. -Continue performing local wound care with Betadine wet-to-dry dressings. -Wound cultures were reviewed by me however they were taken in clinic and a nonsterile way therefore I I will not place a lot of emphasis on the culture. -Plan on getting x-ray during the clinical visit   Return in about 3 weeks (around 11/13/2019)  for get xray.

## 2019-11-01 ENCOUNTER — Ambulatory Visit: Payer: Medicare HMO | Admitting: Podiatry

## 2019-11-07 ENCOUNTER — Other Ambulatory Visit: Payer: Self-pay

## 2019-11-07 ENCOUNTER — Other Ambulatory Visit: Payer: Self-pay | Admitting: *Deleted

## 2019-11-07 ENCOUNTER — Encounter: Payer: Self-pay | Admitting: Vascular Surgery

## 2019-11-07 ENCOUNTER — Ambulatory Visit (INDEPENDENT_AMBULATORY_CARE_PROVIDER_SITE_OTHER): Payer: Medicare HMO | Admitting: Vascular Surgery

## 2019-11-07 ENCOUNTER — Ambulatory Visit (HOSPITAL_COMMUNITY)
Admission: RE | Admit: 2019-11-07 | Discharge: 2019-11-07 | Disposition: A | Discharge status: Home / Self Care | Payer: Medicare HMO | Source: Ambulatory Visit | Attending: Vascular Surgery | Admitting: Vascular Surgery

## 2019-11-07 VITALS — BP 129/79 | HR 50 | Temp 97.8°F | Resp 20 | Ht 68.0 in | Wt 183.0 lb

## 2019-11-07 DIAGNOSIS — I6529 Occlusion and stenosis of unspecified carotid artery: Secondary | ICD-10-CM | POA: Insufficient documentation

## 2019-11-07 DIAGNOSIS — I6523 Occlusion and stenosis of bilateral carotid arteries: Secondary | ICD-10-CM

## 2019-11-07 NOTE — Progress Notes (Signed)
Referring Physician: Dr. Gwenlyn Found  Patient name: Caleb [name] MRN: [id number] DOB: [birth year] Sex: male  REASON FOR CONSULT: Bilateral greater than 80% carotid stenosis HPI: JOSETH [name] is a 69 y.o. male, who on recent screening ultrasound was noted to have bilateral greater than 80% carotid stenosis.  He had initially presented to Dr. Naida Sleight office with nonhealing wounds on his feet.  He has recently had atherectomy of the right SFA and popliteal artery and is scheduled in the near future to have similar procedure on the left side by Dr. Alvester Chou.  He has never had any symptoms of TIA amaurosis or stroke.  He is currently on Plavix and a statin.  I discussed with the patient today that he should be on aspirin Plavix and a statin.  This will be in the event that we consider him for carotid stenting.  Other medical problems include diabetes, hyperlipidemia, hypertension peripheral neuropathy all of which have been stable.  He is on Metformin.  Past Medical History:  Diagnosis Date  . Carotid artery occlusion   . Diabetes mellitus without complication ([place])   . Dyslipidemia 09/27/2012  . Erectile dysfunction 09/27/2012  . Heart murmur   . Hypercholesteremia   . Hyperlipidemia 08/12/2012  . Hypertension   . Hyponatremia 08/14/2012  . Neuropathy   . Pancreatitis   . Pancreatitis, acute 09/27/2012  . Vitamin D deficiency 06/23/2015   Past Surgical History:  Procedure Laterality Date  . ABDOMINAL AORTOGRAM W/LOWER EXTREMITY N/A 08/08/2019   Procedure: ABDOMINAL AORTOGRAM W/LOWER EXTREMITY;  Surgeon: Lorretta Harp, MD;  Location: [facility];  Service: Cardiovascular;  Laterality: N/A;  . COLONOSCOPY  12 years ago   in [place] clinic= normal exam per pt  . KNEE SURGERY    . PERIPHERAL VASCULAR INTERVENTION Right 08/08/2019   Procedure: PERIPHERAL VASCULAR INTERVENTION;  Surgeon: Lorretta Harp, MD;  Location: [facility];  Service: Cardiovascular;  Laterality: Right;    Family  History  Problem Relation Age of Onset  . CAD Father   . Hypertension Father   . Alcohol abuse Father        Cause of death  . Diabetes Mother   . Colon polyps Mother   . CAD Brother 54       CABG  . Colon cancer Neg Hx   . Esophageal cancer Neg Hx   . Rectal cancer Neg Hx   . Stomach cancer Neg Hx     SOCIAL HISTORY: Social History   Socioeconomic History  . Marital status: Widowed    Spouse name: Not on file  . Number of children: Not on file  . Years of education: Not on file  . Highest education level: Not on file  Occupational History  . Occupation: Caregiver    Comment: Special needs  Tobacco Use  . Smoking status: Former Smoker    Types: Cigarettes  . Smokeless tobacco: Never Used  Vaping Use  . Vaping Use: Never used  Substance and Sexual Activity  . Alcohol use: Not Currently    Comment: rare  . Drug use: No  . Sexual activity: Not Currently  Other Topics Concern  . Not on file  Social History Narrative   Widower.  3 daughters and 3 grandchildren.     Social Determinants of Health   Financial Resource Strain:   . Difficulty of Paying Living Expenses: Not on file  Food Insecurity:   . Worried About Charity fundraiser in  the Last Year: Not on file  . Ran Out of Food in the Last Year: Not on file  Transportation Needs:   . Lack of Transportation (Medical): Not on file  . Lack of Transportation (Non-Medical): Not on file  Physical Activity:   . Days of Exercise per Week: Not on file  . Minutes of Exercise per Session: Not on file  Stress:   . Feeling of Stress : Not on file  Social Connections:   . Frequency of Communication with Friends and Family: Not on file  . Frequency of Social Gatherings with Friends and Family: Not on file  . Attends Religious Services: Not on file  . Active Member of Clubs or Organizations: Not on file  . Attends Archivist Meetings: Not on file  . Marital Status: Not on file  Intimate Partner Violence:   .  Fear of Current or Ex-Partner: Not on file  . Emotionally Abused: Not on file  . Physically Abused: Not on file  . Sexually Abused: Not on file    No Known Allergies  Current Outpatient Medications  Medication Sig Dispense Refill  . aspirin EC 81 MG tablet Take 81 mg by mouth daily.    . Blood Glucose Monitoring Suppl (ONETOUCH VERIO) w/Device KIT 1 each by Does not apply route 2 (two) times daily. Use as instructed twice daily, check fasting sugar in morning and check before bedtime. E11.8 1 kit 0  . clopidogrel (PLAVIX) 75 MG tablet Take 1 tablet (75 mg total) by mouth daily. 30 tablet 11  . collagenase (SANTYL) ointment Apply 1 application topically daily. Apply Santyl Ointment to scab on left 3rd and 4th digits. Measurements: Left 3rd toe 1.0 x 0.6 cm Left 4th toe: 2.0 x 1.0 cm (Patient taking differently: Apply 1 application topically daily as needed (Scab). Apply Santyl Ointment to scab on left 3rd and 4th digits. Measurements: Left 3rd toe 1.0 x 0.6 cm Left 4th toe: 2.0 x 1.0 cm) 30 g 0  . Continuous Blood Gluc Receiver (FREESTYLE LIBRE 14 DAY READER) DEVI 1 each by Does not apply route See admin instructions. Use as directed to check blood glucose 1 each 0  . Continuous Blood Gluc Sensor (FREESTYLE LIBRE 14 DAY SENSOR) MISC INJECT IN THE SKIN EVERY 14 (FOURTEEN) DAYS. E11.69 Z79.4 6 each 3  . doxycycline (VIBRA-TABS) 100 MG tablet Take 1 tablet (100 mg total) by mouth 2 (two) times daily. 60 tablet 0  . gabapentin (NEURONTIN) 300 MG capsule Take 1 capsule (300 mg total) by mouth 2 (two) times daily. (Patient taking differently: Take 300 mg by mouth daily as needed (pain). ) 180 capsule 1  . glucose blood test strip Use as instructed twice daily, check fasting sugar in morning and check before bedtime. E11.8 100 each 12  . HUMULIN 70/30 (70-30) 100 UNIT/ML injection INJECT 37 UNITS INTO THE SKIN 2 TIMES DAILY WITH A MEAL. (Patient taking differently: Inject 37 Units into the skin 2 (two)  times daily as needed (high blood sugar). Under 110 do not take) 60 mL 6  . lisinopril-hydrochlorothiazide (ZESTORETIC) 20-12.5 MG tablet Take 2 tablets by mouth daily. 180 tablet 1  . metFORMIN (GLUCOPHAGE) 1000 MG tablet Take 1 tablet (1,000 mg total) by mouth 2 (two) times daily with a meal. (Patient taking differently: Take 1,000 mg by mouth daily. ) 360 tablet 1  . metoprolol tartrate (LOPRESSOR) 100 MG tablet Take 1 tablet (100 mg total) by mouth 2 (two) times daily. [place]  tablet 1  . ONETOUCH DELICA LANCETS 46O MISC Use as instructed twice daily, check fasting sugar in morning and check before bedtime. E11.8 100 each 12  . rosuvastatin (CRESTOR) 40 MG tablet Take 1 tablet (40 mg total) by mouth daily. To lower cholesterol 90 tablet 1  . sildenafil (VIAGRA) 50 MG tablet TAKE 2 TABLETS BY MOUTH DAILY AS NEEDED FOR ERECTILE DYSFUNCTION (Patient taking differently: Take 50-100 mg by mouth as needed for erectile dysfunction. ) 20 tablet 1  . silver sulfADIAZINE (SILVADENE) 1 % cream Apply to affected toes once daily and cover with dressing. 400 g 1  . Vitamin D, Ergocalciferol, (DRISDOL) 50000 units CAPS capsule Take 1 capsule (50,000 Units total) by mouth every 7 (seven) days. 12 capsule 0   Current Facility-Administered Medications  Medication Dose Route Frequency Provider Last Rate Last Admin  . sodium chloride flush (NS) 0.9 % injection 3 mL  3 mL Intravenous Q12H Lorretta Harp, MD        ROS:   General:  No weight loss, Fever, chills  HEENT: No recent headaches, no nasal bleeding, no visual changes, no sore throat  Neurologic: No dizziness, blackouts, seizures. No recent symptoms of stroke or mini- stroke. No recent episodes of slurred speech, or temporary blindness.  Cardiac: No recent episodes of chest pain/pressure, no shortness of breath at rest.  No shortness of breath with exertion.  Denies history of atrial fibrillation or irregular heartbeat  Vascular: No history of rest  pain in feet.  No history of claudication.  + history of non-healing ulcer, No history of DVT   Pulmonary: No home oxygen, no productive cough, no hemoptysis,  No asthma or wheezing  Musculoskeletal:  _0  Arthritis, _1  Low back pain,  _2  Joint pain  Hematologic:No history of hypercoagulable state.  No history of easy bleeding.  No history of anemia  Gastrointestinal: No hematochezia or melena,  No gastroesophageal reflux, no trouble swallowing  Urinary: _3  chronic Kidney disease, _4  on HD - _5  MWF or _6  TTHS, _7  Burning with urination, _8  Frequent urination, _9  Difficulty urinating;   Skin: No rashes  Psychological: No history of anxiety,  No history of depression   Physical Examination  Vitals:   11/07/19 1233 11/07/19 1235  BP: 125/78 129/79  Pulse: (!) 50   Resp: 20   Temp: 97.8 F (36.6 C)   SpO2: 97%   Weight: 183 lb (83 kg)   Height: _10  (1.727 m)     Body mass index is 27.83 kg/m.  General:  Alert and oriented, no acute distress HEENT: Normal Neck: No JVD Cardiac: Regular Rate and Rhythm Skin: No rash, Coban dressing on the left forefoot Extremity Pulses:  2+ radial, brachial, femoral, 2+ right absent left dorsalis pedis, absent posterior tibial pulses bilaterally Musculoskeletal: No deformity or edema  Neurologic: Upper and lower extremity motor 5/5 and symmetric  DATA:  Patient had bilateral carotid duplex exam in May of this year which showed greater than 80% stenosis bilaterally with antegrade vertebral flow.  ASSESSMENT: Asymptomatic bilateral carotid stenosis greater than 80%.   PLAN: Patient be scheduled for CT angiogram of the head and neck for evaluation of carotid endarterectomy or TCAR carotid stenting.  We will follow up with me after his CT scan.  Patient currently under evaluation treatment by Dr. Alvester Chou for his peripheral arterial disease.  He will continue follow-up with Dr. Alvester Chou regarding this.  Ruta Hinds, MD  [facility] Office: [phone]

## 2019-11-08 ENCOUNTER — Encounter (HOSPITAL_COMMUNITY): Payer: Self-pay

## 2019-11-08 ENCOUNTER — Ambulatory Visit (HOSPITAL_COMMUNITY)
Admission: RE | Admit: 2019-11-08 | Discharge: 2019-11-08 | Disposition: A | Discharge status: Home / Self Care | Payer: Medicare HMO | Source: Ambulatory Visit | Attending: Vascular Surgery | Admitting: Vascular Surgery

## 2019-11-08 ENCOUNTER — Telehealth: Payer: Self-pay | Admitting: Internal Medicine

## 2019-11-08 ENCOUNTER — Other Ambulatory Visit: Payer: Self-pay | Admitting: *Deleted

## 2019-11-08 DIAGNOSIS — I6523 Occlusion and stenosis of bilateral carotid arteries: Secondary | ICD-10-CM

## 2019-11-08 DIAGNOSIS — I10 Essential (primary) hypertension: Secondary | ICD-10-CM

## 2019-11-08 LAB — POCT I-STAT CREATININE: Creatinine, Ser: 2 mg/dL — ABNORMAL HIGH (ref 0.61–1.24)

## 2019-11-08 MED ORDER — SODIUM CHLORIDE (PF) 0.9 % IJ SOLN
INTRAMUSCULAR | Status: AC
  Filled 2019-11-08: qty 50

## 2019-11-11 ENCOUNTER — Other Ambulatory Visit: Payer: Self-pay

## 2019-11-11 ENCOUNTER — Ambulatory Visit: Payer: Medicare HMO | Attending: Family Medicine

## 2019-11-11 DIAGNOSIS — I10 Essential (primary) hypertension: Secondary | ICD-10-CM | POA: Diagnosis not present

## 2019-11-11 NOTE — Telephone Encounter (Signed)
Got it!     Thanks =).

## 2019-11-12 ENCOUNTER — Ambulatory Visit (HOSPITAL_COMMUNITY): Admission: RE | Admit: 2019-11-12 | Payer: Medicare HMO | Source: Ambulatory Visit

## 2019-11-12 LAB — CMP14+EGFR
ALT: 35 IU/L (ref 0–44)
AST: 27 IU/L (ref 0–40)
Albumin/Globulin Ratio: 1.4 (ref 1.2–2.2)
Albumin: 4.3 g/dL (ref 3.8–4.8)
Alkaline Phosphatase: 112 IU/L (ref 48–121)
BUN/Creatinine Ratio: 13 (ref 10–24)
BUN: 17 mg/dL (ref 8–27)
Bilirubin Total: 0.4 mg/dL (ref 0.0–1.2)
CO2: 21 mmol/L (ref 20–29)
Calcium: 8.9 mg/dL (ref 8.6–10.2)
Chloride: 99 mmol/L (ref 96–106)
Creatinine, Ser: 1.34 mg/dL — ABNORMAL HIGH (ref 0.76–1.27)
GFR calc Af Amer: 62 mL/min/{1.73_m2} (ref >59)
GFR calc non Af Amer: 54 mL/min/{1.73_m2} — ABNORMAL LOW (ref >59)
Globulin, Total: 3 g/dL (ref 1.5–4.5)
Glucose: 170 mg/dL — ABNORMAL HIGH (ref 65–99)
Potassium: 4.3 mmol/L (ref 3.5–5.2)
Sodium: 137 mmol/L (ref 134–144)
Total Protein: 7.3 g/dL (ref 6.0–8.5)

## 2019-11-13 ENCOUNTER — Ambulatory Visit
Admission: RE | Admit: 2019-11-13 | Discharge: 2019-11-13 | Disposition: A | Discharge status: Home / Self Care | Payer: Medicare HMO | Source: Ambulatory Visit | Attending: Vascular Surgery | Admitting: Vascular Surgery

## 2019-11-13 ENCOUNTER — Other Ambulatory Visit: Payer: Self-pay | Admitting: *Deleted

## 2019-11-13 ENCOUNTER — Other Ambulatory Visit: Payer: Self-pay

## 2019-11-13 DIAGNOSIS — I6523 Occlusion and stenosis of bilateral carotid arteries: Secondary | ICD-10-CM

## 2019-11-13 DIAGNOSIS — I6782 Cerebral ischemia: Secondary | ICD-10-CM | POA: Diagnosis not present

## 2019-11-13 DIAGNOSIS — G9389 Other specified disorders of brain: Secondary | ICD-10-CM | POA: Diagnosis not present

## 2019-11-13 MED ORDER — IOPAMIDOL (ISOVUE-300) INJECTION 61%
75.0000 mL | Freq: Once | INTRAVENOUS | Status: AC | PRN
  Administered 2019-11-13: 75 mL via INTRAVENOUS

## 2019-11-14 ENCOUNTER — Telehealth: Payer: Self-pay | Admitting: Cardiovascular Disease

## 2019-11-14 ENCOUNTER — Ambulatory Visit (INDEPENDENT_AMBULATORY_CARE_PROVIDER_SITE_OTHER): Payer: Medicare HMO | Admitting: Vascular Surgery

## 2019-11-14 ENCOUNTER — Encounter: Payer: Self-pay | Admitting: Vascular Surgery

## 2019-11-14 VITALS — BP 152/96 | HR 81 | Temp 97.2°F | Resp 20 | Ht 68.0 in | Wt 189.0 lb

## 2019-11-14 DIAGNOSIS — I739 Peripheral vascular disease, unspecified: Secondary | ICD-10-CM | POA: Diagnosis not present

## 2019-11-14 DIAGNOSIS — I6523 Occlusion and stenosis of bilateral carotid arteries: Secondary | ICD-10-CM | POA: Diagnosis not present

## 2019-11-14 NOTE — Telephone Encounter (Signed)
Caleb [name] with [facility] states Dr. Gwenlyn Found just got off of a call with Dr. Oneida Alar regarding scheduling the patient for tomorrow, 11/15/19. Made her aware that the appointment has not yet been scheduled. She is requesting a call back to confirm the time of that appointment.

## 2019-11-14 NOTE — Progress Notes (Signed)
Patient is a 69 year old male who returns for follow-up today.  He was last seen on August 19 for evaluation of bilateral greater than 80% asymptomatic carotid stenosis.  He returns today after CT angiogram.  He currently still has no symptoms of TIA amaurosis or stroke.  Additionally he has a nonhealing wound of his left foot and is under evaluation by Dr. Posey Pronto and Dr. Gwenlyn Found for treatment of this.  He has previously had percutaneous intervention for his right leg by Dr. Gwenlyn Found.  He is currently on Plavix and aspirin.  However, he states he would like to try to get off of Plavix because he would like to eat grapefruit and cannot currently due to interaction with the medication.  Current Outpatient Medications on File Prior to Visit  Medication Sig Dispense Refill  . aspirin EC 81 MG tablet Take 81 mg by mouth daily.    . Blood Glucose Monitoring Suppl (ONETOUCH VERIO) w/Device KIT 1 each by Does not apply route 2 (two) times daily. Use as instructed twice daily, check fasting sugar in morning and check before bedtime. E11.8 1 kit 0  . clopidogrel (PLAVIX) 75 MG tablet Take 1 tablet (75 mg total) by mouth daily. 30 tablet 11  . collagenase (SANTYL) ointment Apply 1 application topically daily. Apply Santyl Ointment to scab on left 3rd and 4th digits. Measurements: Left 3rd toe 1.0 x 0.6 cm Left 4th toe: 2.0 x 1.0 cm (Patient taking differently: Apply 1 application topically daily as needed (Scab). Apply Santyl Ointment to scab on left 3rd and 4th digits. Measurements: Left 3rd toe 1.0 x 0.6 cm Left 4th toe: 2.0 x 1.0 cm) 30 g 0  . Continuous Blood Gluc Receiver (FREESTYLE LIBRE 14 DAY READER) DEVI 1 each by Does not apply route See admin instructions. Use as directed to check blood glucose 1 each 0  . Continuous Blood Gluc Sensor (FREESTYLE LIBRE 14 DAY SENSOR) MISC INJECT IN THE SKIN EVERY 14 (FOURTEEN) DAYS. E11.69 Z79.4 6 each 3  . doxycycline (VIBRA-TABS) 100 MG tablet Take 1 tablet (100 mg total) by  mouth 2 (two) times daily. 60 tablet 0  . gabapentin (NEURONTIN) 300 MG capsule Take 1 capsule (300 mg total) by mouth 2 (two) times daily. (Patient taking differently: Take 300 mg by mouth daily as needed (pain). ) 180 capsule 1  . glucose blood test strip Use as instructed twice daily, check fasting sugar in morning and check before bedtime. E11.8 100 each 12  . HUMULIN 70/30 (70-30) 100 UNIT/ML injection INJECT 37 UNITS INTO THE SKIN 2 TIMES DAILY WITH A MEAL. (Patient taking differently: Inject 37 Units into the skin 2 (two) times daily as needed (high blood sugar). Under 110 do not take) 60 mL 6  . lisinopril-hydrochlorothiazide (ZESTORETIC) 20-12.5 MG tablet Take 2 tablets by mouth daily. 180 tablet 1  . metFORMIN (GLUCOPHAGE) 1000 MG tablet Take 1 tablet (1,000 mg total) by mouth 2 (two) times daily with a meal. (Patient taking differently: Take 1,000 mg by mouth daily. ) 360 tablet 1  . metoprolol tartrate (LOPRESSOR) 100 MG tablet Take 1 tablet (100 mg total) by mouth 2 (two) times daily. 180 tablet 1  . ONETOUCH DELICA LANCETS 41U MISC Use as instructed twice daily, check fasting sugar in morning and check before bedtime. E11.8 100 each 12  . rosuvastatin (CRESTOR) 40 MG tablet Take 1 tablet (40 mg total) by mouth daily. To lower cholesterol 90 tablet 1  . sildenafil (VIAGRA) 50 MG tablet  TAKE 2 TABLETS BY MOUTH DAILY AS NEEDED FOR ERECTILE DYSFUNCTION (Patient taking differently: Take 50-100 mg by mouth as needed for erectile dysfunction. ) 20 tablet 1  . silver sulfADIAZINE (SILVADENE) 1 % cream Apply to affected toes once daily and cover with dressing. 400 g 1  . Vitamin D, Ergocalciferol, (DRISDOL) 50000 units CAPS capsule Take 1 capsule (50,000 Units total) by mouth every 7 (seven) days. 12 capsule 0   Current Facility-Administered Medications on File Prior to Visit  Medication Dose Route Frequency Provider Last Rate Last Admin  . sodium chloride flush (NS) 0.9 % injection 3 mL  3 mL  Intravenous Q12H Lorretta Harp, MD        Review of systems: He has no shortness of breath.  He has no chest pain.  He has no fever.  Past Medical History:  Diagnosis Date  . Carotid artery occlusion   . Diabetes mellitus without complication ([place])   . Dyslipidemia 09/27/2012  . Erectile dysfunction 09/27/2012  . Heart murmur   . Hypercholesteremia   . Hyperlipidemia 08/12/2012  . Hypertension   . Hyponatremia 08/14/2012  . Neuropathy   . Pancreatitis   . Pancreatitis, acute 09/27/2012  . Vitamin D deficiency 06/23/2015   Past Surgical History:  Procedure Laterality Date  . ABDOMINAL AORTOGRAM W/LOWER EXTREMITY N/A 08/08/2019   Procedure: ABDOMINAL AORTOGRAM W/LOWER EXTREMITY;  Surgeon: Lorretta Harp, MD;  Location: [facility];  Service: Cardiovascular;  Laterality: N/A;  . COLONOSCOPY  12 years ago   in [place] clinic= normal exam per pt  . KNEE SURGERY    . PERIPHERAL VASCULAR INTERVENTION Right 08/08/2019   Procedure: PERIPHERAL VASCULAR INTERVENTION;  Surgeon: Lorretta Harp, MD;  Location: [facility];  Service: Cardiovascular;  Laterality: Right;    Physical exam:  Vitals:   11/14/19 1103 11/14/19 1105  BP: (!) 162/102 (!) 152/96  Pulse: 81   Resp: 20   Temp: (!) 97.2 F (36.2 C)   SpO2: 97%   Weight: 189 lb (85.7 kg)   Height: _0  (1.727 m)     Neck: 2+ carotid pulses  Chest: Clear to auscultation  Cardiac: Regular rate and rhythm  Neuro: Symmetric upper extremity lower extremity motor strength no facial asymmetry  Extremities: 2+ right dorsalis pedis pulse absent pedal pulses left foot  Skin: Ulceration between toes 4 and 5 left foot and maceration of the skin surrounding toes 3 4 and 5  Data: I reviewed the patient's CT angiogram of the neck today which shows greater than 70% probably approaching 80% stenosis of both internal carotid arteries with some moderate calcification.  There appears to be adequate room for clamping above the  lesion bilaterally.  Assessment: Bilateral asymptomatic 80% internal carotid artery stenosis.  Patient also has severe peripheral arterial disease and a nonhealing wound on his left foot.  Although I would not administer general anesthesia to do a leg bypass with his 80% carotid stenosis I think he would benefit from a percutaneous option if possible to try to get his foot healing.  By the time that we have both of his carotids done it will probably be 6 to 8 weeks before both carotids are done and completely healed and he may be in trouble with his foot by then.  I discussed this with Dr. Gwenlyn Found today he is going to see the patient tomorrow for evaluation for revascularization of his left leg with percutaneous options.  If there are no good  percutaneous options then most likely we would proceed with doing left carotid endarterectomy and then considering bypass operation after we have at least 1 carotid in good shape.  He would then have interval right carotid endarterectomy.  Plan: See above  Ruta Hinds, MD [facility] Office: [phone]

## 2019-11-14 NOTE — Telephone Encounter (Signed)
Returned call to [facility]-will call patient to schedule.      Called patient, scheduled to see Dr. Gwenlyn Found at [facility] tomorrow.  Patient aware.   Ok per Dr. Gwenlyn Found

## 2019-11-15 ENCOUNTER — Ambulatory Visit (INDEPENDENT_AMBULATORY_CARE_PROVIDER_SITE_OTHER): Payer: Medicare HMO | Admitting: Cardiovascular Disease

## 2019-11-15 ENCOUNTER — Other Ambulatory Visit: Payer: Self-pay | Admitting: *Deleted

## 2019-11-15 ENCOUNTER — Other Ambulatory Visit (HOSPITAL_COMMUNITY): Payer: Self-pay | Admitting: Cardiovascular Disease

## 2019-11-15 ENCOUNTER — Encounter: Payer: Self-pay | Admitting: Cardiovascular Disease

## 2019-11-15 ENCOUNTER — Other Ambulatory Visit (HOSPITAL_COMMUNITY): Payer: Medicare HMO

## 2019-11-15 ENCOUNTER — Ambulatory Visit: Payer: Medicare HMO | Admitting: Podiatry

## 2019-11-15 ENCOUNTER — Ambulatory Visit (INDEPENDENT_AMBULATORY_CARE_PROVIDER_SITE_OTHER): Payer: Medicare HMO | Admitting: Podiatry

## 2019-11-15 ENCOUNTER — Other Ambulatory Visit (HOSPITAL_COMMUNITY)
Admission: RE | Admit: 2019-11-15 | Discharge: 2019-11-15 | Disposition: A | Discharge status: Home / Self Care | Payer: Medicare HMO | Source: Ambulatory Visit | Attending: Cardiovascular Disease | Admitting: Cardiovascular Disease

## 2019-11-15 ENCOUNTER — Other Ambulatory Visit: Payer: Self-pay

## 2019-11-15 DIAGNOSIS — I998 Other disorder of circulatory system: Secondary | ICD-10-CM | POA: Diagnosis not present

## 2019-11-15 DIAGNOSIS — Z01812 Encounter for preprocedural laboratory examination: Secondary | ICD-10-CM | POA: Insufficient documentation

## 2019-11-15 DIAGNOSIS — Z959 Presence of cardiac and vascular implant and graft, unspecified: Secondary | ICD-10-CM

## 2019-11-15 DIAGNOSIS — Z9889 Other specified postprocedural states: Secondary | ICD-10-CM

## 2019-11-15 DIAGNOSIS — I70229 Atherosclerosis of native arteries of extremities with rest pain, unspecified extremity: Secondary | ICD-10-CM

## 2019-11-15 DIAGNOSIS — M869 Osteomyelitis, unspecified: Secondary | ICD-10-CM

## 2019-11-15 DIAGNOSIS — I350 Nonrheumatic aortic (valve) stenosis: Secondary | ICD-10-CM

## 2019-11-15 DIAGNOSIS — L97502 Non-pressure chronic ulcer of other part of unspecified foot with fat layer exposed: Secondary | ICD-10-CM

## 2019-11-15 DIAGNOSIS — I739 Peripheral vascular disease, unspecified: Secondary | ICD-10-CM

## 2019-11-15 DIAGNOSIS — E11621 Type 2 diabetes mellitus with foot ulcer: Secondary | ICD-10-CM

## 2019-11-15 DIAGNOSIS — Z20822 Contact with and (suspected) exposure to covid-19: Secondary | ICD-10-CM | POA: Insufficient documentation

## 2019-11-15 LAB — CBC
Hematocrit: 40.2 % (ref 37.5–51.0)
Hemoglobin: 13.3 g/dL (ref 13.0–17.7)
MCH: 28.3 pg (ref 26.6–33.0)
MCHC: 33.1 g/dL (ref 31.5–35.7)
MCV: 86 fL (ref 79–97)
Platelets: 300 10*3/uL (ref 150–450)
RBC: 4.7 x10E6/uL (ref 4.14–5.80)
RDW: 14.5 % (ref 11.6–15.4)
WBC: 6.8 10*3/uL (ref 3.4–10.8)

## 2019-11-15 LAB — BASIC METABOLIC PANEL
BUN/Creatinine Ratio: 11 (ref 10–24)
BUN: 12 mg/dL (ref 8–27)
CO2: 25 mmol/L (ref 20–29)
Calcium: 9.2 mg/dL (ref 8.6–10.2)
Chloride: 100 mmol/L (ref 96–106)
Creatinine, Ser: 1.08 mg/dL (ref 0.76–1.27)
GFR calc Af Amer: 81 mL/min/{1.73_m2} (ref >59)
GFR calc non Af Amer: 70 mL/min/{1.73_m2} (ref >59)
Glucose: 263 mg/dL — ABNORMAL HIGH (ref 65–99)
Potassium: 5.2 mmol/L (ref 3.5–5.2)
Sodium: 138 mmol/L (ref 134–144)

## 2019-11-15 LAB — SARS CORONAVIRUS 2 (TAT 6-24 HRS): SARS Coronavirus 2: NEGATIVE

## 2019-11-15 MED ORDER — SODIUM CHLORIDE 0.9% FLUSH: 3.0000 mL | Freq: Two times a day (BID) | INTRAVENOUS | Status: DC

## 2019-11-15 MED ORDER — DOXYCYCLINE HYCLATE 100 MG PO TABS: 100.0000 mg | ORAL_TABLET | Freq: Two times a day (BID) | ORAL | 0 refills | Status: DC

## 2019-11-15 MED FILL — DOXYCYCLINE HYCLATE 100 MG: 100 | 30 days supply | Qty: 60 | Fill #0

## 2019-11-15 NOTE — Assessment & Plan Note (Signed)
Mr. Hickel returns today for early follow-up.  I revascularized his right lower extremity on 08/08/2019 with marked improvement in symptoms and ABIs.  At the time I demonstrated an occluded left popliteal artery, posterior tibial and peroneal with high-grade AT as well although he did have dorsalis pedis filling.  He has had progressive gangrenous changes in his left fourth toe and was seen by Dr. Oneida Alar yesterday who referred him back for consideration of early endovascular therapy for limb salvage.  We have discussed this today and plan on proceeding with endovascular therapy on Monday.

## 2019-11-15 NOTE — Patient Instructions (Addendum)
    [place] [address] Dept: [phone] Loc: [phone]  Caleb [name]  11/15/2019  You are scheduled for a Peripheral Angiogram on Monday, August 30 with Dr. Quay Burow.  1. Please arrive at the [facility] (Main Entrance A) at [facility]: [address] [place], [place] [address] at 5:30 AM (This time is two hours before your procedure to ensure your preparation). Free valet parking service is available.   Special note: Every effort is made to have your procedure done on time. Please understand that emergencies sometimes delay scheduled procedures.  2. Diet: Do not eat solid foods after midnight.  The patient may have clear liquids until 5am upon the day of the procedure.  3. Labs: Today in office   Covid test: Today at 2:15 pm [place], [place]  [id number]  4. Medication instructions in preparation for your procedure:   Contrast Allergy: No  Hold lisinopril/hydrochlorithiazide AM of procedure  Take 1/2 of your normal dose of insulin the night before NO insulin AM of procedure   Do not take Diabetes Med Glucophage (Metformin) on the day of the procedure and HOLD 48 HOURS AFTER THE PROCEDURE.  On the morning of your procedure, take your Plavix/Clopidogrel, Aspirin and any morning medicines NOT listed above.  You may use sips of water.  5. Plan for one night stay--bring personal belongings. 6. Bring a current list of your medications and current insurance cards. 7. You MUST have a responsible person to drive you home. 8. Someone MUST be with you the first 24 hours after you arrive home or your discharge will be delayed. 9. Please wear clothes that are easy to get on and off and wear slip-on shoes.  Thank you for allowing [place] to care for you!   -- [facility]   Your physician has requested that you  have a lower extremity arterial duplex 1 week after procedure. This test is an ultrasound of the arteries in the legs. It looks at arterial blood flow in the legs. Allow one hour for Lower Arterial scans. There are no restrictions or special instructions  Follow up with Dr. Gwenlyn Found in 2 weeks

## 2019-11-15 NOTE — Progress Notes (Signed)
   11/15/2019 Pratham R Dicola   03/21/1950  [id number]  Primary Physician Newlin, Enobong, MD Primary Cardiologist: Jonathan J Berry MD FACP, FACC, FAHA, FSCAI  HPI:  Caleb [name] is a 69 y.o.  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 Galloway, his podiatrist for evaluation of critical limb ischemia.  I last saw him in the office 09/06/2019.His wife of 44 years did die of ovarian cancer 3 years ago but he is very positive about meeting somebody new in his life. He is very active and swims 4 days a week, walks 3 to 4 days a week and belongs to fitness. He denies chest pain or shortness of breath. His cardiac risk factors are notable for treated hypertension, diabetes and hyperlipidemia. His father did die of a myocardial infarction age 56. He is never had a heart attack or stroke. He does complain of lifestyle and claudication however. He developed wounds on his toes after walking 5 miles about [place] with new shoes and has been treated by Dr. Galloway since that time with slow progression of wound healing.  I performed peripheral angiography on him 08/08/2019 and performed atherectomy and drug-coated balloon angioplasty of high-grade right SFA and popliteal stenosis with two-vessel runoff.  This resulted in an improvement in his claudication symptoms, healing of his right foot wounds and improvement in his Doppler studies.  He did have an occluded left popliteal artery which I will address in a staged fashion in the near future although his wounds on his left foot are slowly healing.  I also performed carotid Doppler studies that showed high-grade right greater than left ICA stenosis and have referred him to vascular surgery for consideration of endarterectomy.  He denies chest pain or shortness of breath.  He does have mild aortic stenosis by 2D echo 2  years ago and a high-pitched outflow tract murmur.  We check a 2D echo on 08/27/2019 that showed a decline in his EF from 50 to 55% down to 40 to 45% with moderate to severe aortic stenosis.  A Myoview stress test performed 08/30/2019 showed inferior scar without ischemia.  He really denies chest pain or shortness of breath.  He is referred back by Dr. Fields because of progressive gangrene in his left fourth toe.  His angiogram which I performed 08/08/2019 did show an occluded left popliteal artery with occluded posterior tibial and peroneal and high-grade AT disease.  I am going to attempt endovascular therapy on Monday for limb salvage.  Current Meds  Medication Sig  . aspirin EC 81 MG tablet Take 81 mg by mouth daily.  . Blood Glucose Monitoring Suppl (ONETOUCH VERIO) w/Device KIT 1 each by Does not apply route 2 (two) times daily. Use as instructed twice daily, check fasting sugar in morning and check before bedtime. E11.8  . clopidogrel (PLAVIX) 75 MG tablet Take 1 tablet (75 mg total) by mouth daily.  . collagenase (SANTYL) ointment Apply 1 application topically daily. Apply Santyl Ointment to scab on left 3rd and 4th digits. Measurements: Left 3rd toe 1.0 x 0.6 cm Left 4th toe: 2.0 x 1.0 cm (Patient taking differently: Apply 1 application topically daily as needed (Scab). Apply Santyl Ointment to scab on left 3rd and 4th digits. Measurements: Left 3rd toe 1.0 x 0.6 cm Left 4th toe: 2.0 x 1.0 cm)  . Continuous Blood Gluc Receiver (FREESTYLE LIBRE 14   DAY READER) DEVI 1 each by Does not apply route See admin instructions. Use as directed to check blood glucose  . Continuous Blood Gluc Sensor (FREESTYLE LIBRE 14 DAY SENSOR) MISC INJECT IN THE SKIN EVERY 14 (FOURTEEN) DAYS. E11.69 Z79.4  . doxycycline (VIBRA-TABS) 100 MG tablet Take 1 tablet (100 mg total) by mouth 2 (two) times daily.  . gabapentin (NEURONTIN) 300 MG capsule Take 1 capsule (300 mg total) by mouth 2 (two) times daily. (Patient taking  differently: Take 300 mg by mouth daily as needed (pain). )  . glucose blood test strip Use as instructed twice daily, check fasting sugar in morning and check before bedtime. E11.8  . HUMULIN 70/30 (70-30) 100 UNIT/ML injection INJECT 37 UNITS INTO THE SKIN 2 TIMES DAILY WITH A MEAL. (Patient taking differently: Inject 37 Units into the skin 2 (two) times daily as needed (high blood sugar). Under 110 do not take)  . lisinopril-hydrochlorothiazide (ZESTORETIC) 20-12.5 MG tablet Take 2 tablets by mouth daily.  . metFORMIN (GLUCOPHAGE) 1000 MG tablet Take 1 tablet (1,000 mg total) by mouth 2 (two) times daily with a meal. (Patient taking differently: Take 1,000 mg by mouth daily. )  . metoprolol tartrate (LOPRESSOR) 100 MG tablet Take 1 tablet (100 mg total) by mouth 2 (two) times daily.  . ONETOUCH DELICA LANCETS 33G MISC Use as instructed twice daily, check fasting sugar in morning and check before bedtime. E11.8  . rosuvastatin (CRESTOR) 40 MG tablet Take 1 tablet (40 mg total) by mouth daily. To lower cholesterol  . sildenafil (VIAGRA) 50 MG tablet TAKE 2 TABLETS BY MOUTH DAILY AS NEEDED FOR ERECTILE DYSFUNCTION (Patient taking differently: Take 50-100 mg by mouth as needed for erectile dysfunction. )  . silver sulfADIAZINE (SILVADENE) 1 % cream Apply to affected toes once daily and cover with dressing.  . Vitamin D, Ergocalciferol, (DRISDOL) 50000 units CAPS capsule Take 1 capsule (50,000 Units total) by mouth every 7 (seven) days.   Current Facility-Administered Medications for the 11/15/19 encounter (Office Visit) with Berry, Jonathan J, MD  Medication  . sodium chloride flush (NS) 0.9 % injection 3 mL     No Known Allergies  Social History   Socioeconomic History  . Marital status: Widowed    Spouse name: Not on file  . Number of children: Not on file  . Years of education: Not on file  . Highest education level: Not on file  Occupational History  . Occupation: Caregiver    Comment:  Special needs  Tobacco Use  . Smoking status: Former Smoker    Types: Cigarettes  . Smokeless tobacco: Never Used  Vaping Use  . Vaping Use: Never used  Substance and Sexual Activity  . Alcohol use: Not Currently    Comment: rare  . Drug use: No  . Sexual activity: Not Currently  Other Topics Concern  . Not on file  Social History Narrative   Widower.  3 daughters and 3 grandchildren.     Social Determinants of Health   Financial Resource Strain:   . Difficulty of Paying Living Expenses: Not on file  Food Insecurity:   . Worried About Running Out of Food in the Last Year: Not on file  . Ran Out of Food in the Last Year: Not on file  Transportation Needs:   . Lack of Transportation (Medical): Not on file  . Lack of Transportation (Non-Medical): Not on file  Physical Activity:   . Days of Exercise per Week: Not on file  .   Minutes of Exercise per Session: Not on file  Stress:   . Feeling of Stress : Not on file  Social Connections:   . Frequency of Communication with Friends and Family: Not on file  . Frequency of Social Gatherings with Friends and Family: Not on file  . Attends Religious Services: Not on file  . Active Member of Clubs or Organizations: Not on file  . Attends Club or Organization Meetings: Not on file  . Marital Status: Not on file  Intimate Partner Violence:   . Fear of Current or Ex-Partner: Not on file  . Emotionally Abused: Not on file  . Physically Abused: Not on file  . Sexually Abused: Not on file     Review of Systems: General: negative for chills, fever, night sweats or weight changes.  Cardiovascular: negative for chest pain, dyspnea on exertion, edema, orthopnea, palpitations, paroxysmal nocturnal dyspnea or shortness of breath Dermatological: negative for rash Respiratory: negative for cough or wheezing Urologic: negative for hematuria Abdominal: negative for nausea, vomiting, diarrhea, bright red blood per rectum, melena, or  hematemesis Neurologic: negative for visual changes, syncope, or dizziness All other systems reviewed and are otherwise negative except as noted above.    Blood pressure 140/82, pulse 89, height 5' 8" (1.727 m), weight 189 lb (85.7 kg), SpO2 98 %.  General appearance: alert and no distress Neck: no adenopathy, no JVD, supple, symmetrical, trachea midline, thyroid not enlarged, symmetric, no tenderness/mass/nodules and Bilateral carotid bruits Lungs: clear to auscultation bilaterally Heart: 2/6 outflow tract murmur consistent with aortic stenosis Extremities: extremities normal, atraumatic, no cyanosis or edema Pulses: Diminished pedal pulses Skin: Gangrene left fourth toe Neurologic: Alert and oriented X 3, normal strength and tone. Normal symmetric reflexes. Normal coordination and gait  EKG not performed today  ASSESSMENT AND PLAN:   Critical limb ischemia with history of revascularization of same extremity Mr. Boran returns today for early follow-up.  I revascularized his right lower extremity on 08/08/2019 with marked improvement in symptoms and ABIs.  At the time I demonstrated an occluded left popliteal artery, posterior tibial and peroneal with high-grade AT as well although he did have dorsalis pedis filling.  He has had progressive gangrenous changes in his left fourth toe and was seen by Dr. Fields yesterday who referred him back for consideration of early endovascular therapy for limb salvage.  We have discussed this today and plan on proceeding with endovascular therapy on Monday.      Jonathan J. Berry MD FACP,FACC,FAHA, FSCAI 11/15/2019 11:22 AM 

## 2019-11-15 NOTE — H&P (View-Only) (Signed)
11/15/2019 Caleb [name]   24-Jun-1950  [id number]  Primary Physician Caleb Rakes, MD Primary Cardiologist: Caleb Harp MD Caleb [name], [place], [place]  HPI:  Caleb [name] is a 69 y.o.  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 Adah [name], his podiatrist for evaluation of critical limb ischemia.  I last saw him in the office 09/06/2019.His wife of 71 years did die of ovarian cancer 3 years ago but he is very positive about meeting somebody new in his life. He is very active and swims 4 days a week, walks 3 to 4 days a week and belongs to fitness. He denies chest pain or shortness of breath. His cardiac risk factors are notable for treated hypertension, diabetes and hyperlipidemia. His father did die of a myocardial infarction age 78. He is never had a heart attack or stroke. He does complain of lifestyle and claudication however. He developed wounds on his toes after walking 5 miles about [facility] with new shoes and has been treated by Dr. Adah [name] since that time with slow progression of wound healing.  I performed peripheral angiography on him 08/08/2019 and performed atherectomy and drug-coated balloon angioplasty of high-grade right SFA and popliteal stenosis with two-vessel runoff.  This resulted in an improvement in his claudication symptoms, healing of his right foot wounds and improvement in his Doppler studies.  He did have an occluded left popliteal artery which I will address in a staged fashion in the near future although his wounds on his left foot are slowly healing.  I also performed carotid Doppler studies that showed high-grade right greater than left ICA stenosis and have referred him to vascular surgery for consideration of endarterectomy.  He denies chest pain or shortness of breath.  He does have mild aortic stenosis by 2D echo 2  years ago and a high-pitched outflow tract murmur.  We check a 2D echo on 08/27/2019 that showed a decline in his EF from 50 to 55% down to 40 to 45% with moderate to severe aortic stenosis.  A Myoview stress test performed 08/30/2019 showed inferior scar without ischemia.  He really denies chest pain or shortness of breath.  He is referred back by Dr. Oneida [name] because of progressive gangrene in his left fourth toe.  His angiogram which I performed 08/08/2019 did show an occluded left popliteal artery with occluded posterior tibial and peroneal and high-grade AT disease.  I am going to attempt endovascular therapy on Monday for limb salvage.  Current Meds  Medication Sig  . aspirin EC 81 MG tablet Take 81 mg by mouth daily.  . Blood Glucose Monitoring Suppl (ONETOUCH VERIO) w/Device KIT 1 each by Does not apply route 2 (two) times daily. Use as instructed twice daily, check fasting sugar in morning and check before bedtime. E11.8  . clopidogrel (PLAVIX) 75 MG tablet Take 1 tablet (75 mg total) by mouth daily.  . collagenase (SANTYL) ointment Apply 1 application topically daily. Apply Santyl Ointment to scab on left 3rd and 4th digits. Measurements: Left 3rd toe 1.0 x 0.6 cm Left 4th toe: 2.0 x 1.0 cm (Patient taking differently: Apply 1 application topically daily as needed (Scab). Apply Santyl Ointment to scab on left 3rd and 4th digits. Measurements: Left 3rd toe 1.0 x 0.6 cm Left 4th toe: 2.0 x 1.0 cm)  . Continuous Blood Gluc Receiver (FREESTYLE LIBRE 14  DAY READER) DEVI 1 each by Does not apply route See admin instructions. Use as directed to check blood glucose  . Continuous Blood Gluc Sensor (FREESTYLE LIBRE 14 DAY SENSOR) MISC INJECT IN THE SKIN EVERY 14 (FOURTEEN) DAYS. E11.69 Z79.4  . doxycycline (VIBRA-TABS) 100 MG tablet Take 1 tablet (100 mg total) by mouth 2 (two) times daily.  Marland Kitchen gabapentin (NEURONTIN) 300 MG capsule Take 1 capsule (300 mg total) by mouth 2 (two) times daily. (Patient taking  differently: Take 300 mg by mouth daily as needed (pain). )  . glucose blood test strip Use as instructed twice daily, check fasting sugar in morning and check before bedtime. E11.8  . HUMULIN 70/30 (70-30) 100 UNIT/ML injection INJECT 37 UNITS INTO THE SKIN 2 TIMES DAILY WITH A MEAL. (Patient taking differently: Inject 37 Units into the skin 2 (two) times daily as needed (high blood sugar). Under 110 do not take)  . lisinopril-hydrochlorothiazide (ZESTORETIC) 20-12.5 MG tablet Take 2 tablets by mouth daily.  . metFORMIN (GLUCOPHAGE) 1000 MG tablet Take 1 tablet (1,000 mg total) by mouth 2 (two) times daily with a meal. (Patient taking differently: Take 1,000 mg by mouth daily. )  . metoprolol tartrate (LOPRESSOR) 100 MG tablet Take 1 tablet (100 mg total) by mouth 2 (two) times daily.  Glory Rosebush DELICA LANCETS 40J MISC Use as instructed twice daily, check fasting sugar in morning and check before bedtime. E11.8  . rosuvastatin (CRESTOR) 40 MG tablet Take 1 tablet (40 mg total) by mouth daily. To lower cholesterol  . sildenafil (VIAGRA) 50 MG tablet TAKE 2 TABLETS BY MOUTH DAILY AS NEEDED FOR ERECTILE DYSFUNCTION (Patient taking differently: Take 50-100 mg by mouth as needed for erectile dysfunction. )  . silver sulfADIAZINE (SILVADENE) 1 % cream Apply to affected toes once daily and cover with dressing.  . Vitamin D, Ergocalciferol, (DRISDOL) 50000 units CAPS capsule Take 1 capsule (50,000 Units total) by mouth every 7 (seven) days.   Current Facility-Administered Medications for the 11/15/19 encounter (Office Visit) with Caleb Harp, MD  Medication  . sodium chloride flush (NS) 0.9 % injection 3 mL     No Known Allergies  Social History   Socioeconomic History  . Marital status: Widowed    Spouse name: Not on file  . Number of children: Not on file  . Years of education: Not on file  . Highest education level: Not on file  Occupational History  . Occupation: Caregiver    Comment:  Special needs  Tobacco Use  . Smoking status: Former Smoker    Types: Cigarettes  . Smokeless tobacco: Never Used  Vaping Use  . Vaping Use: Never used  Substance and Sexual Activity  . Alcohol use: Not Currently    Comment: rare  . Drug use: No  . Sexual activity: Not Currently  Other Topics Concern  . Not on file  Social History Narrative   Widower.  3 daughters and 3 grandchildren.     Social Determinants of Health   Financial Resource Strain:   . Difficulty of Paying Living Expenses: Not on file  Food Insecurity:   . Worried About Charity fundraiser in the Last Year: Not on file  . Ran Out of Food in the Last Year: Not on file  Transportation Needs:   . Lack of Transportation (Medical): Not on file  . Lack of Transportation (Non-Medical): Not on file  Physical Activity:   . Days of Exercise per Week: Not on file  .  Minutes of Exercise per Session: Not on file  Stress:   . Feeling of Stress : Not on file  Social Connections:   . Frequency of Communication with Friends and Family: Not on file  . Frequency of Social Gatherings with Friends and Family: Not on file  . Attends Religious Services: Not on file  . Active Member of Clubs or Organizations: Not on file  . Attends Archivist Meetings: Not on file  . Marital Status: Not on file  Intimate Partner Violence:   . Fear of Current or Ex-Partner: Not on file  . Emotionally Abused: Not on file  . Physically Abused: Not on file  . Sexually Abused: Not on file     Review of Systems: General: negative for chills, fever, night sweats or weight changes.  Cardiovascular: negative for chest pain, dyspnea on exertion, edema, orthopnea, palpitations, paroxysmal nocturnal dyspnea or shortness of breath Dermatological: negative for rash Respiratory: negative for cough or wheezing Urologic: negative for hematuria Abdominal: negative for nausea, vomiting, diarrhea, bright red blood per rectum, melena, or  hematemesis Neurologic: negative for visual changes, syncope, or dizziness All other systems reviewed and are otherwise negative except as noted above.    Blood pressure 140/82, pulse 89, height _0  (1.727 m), weight 189 lb (85.7 kg), SpO2 98 %.  General appearance: alert and no distress Neck: no adenopathy, no JVD, supple, symmetrical, trachea midline, thyroid not enlarged, symmetric, no tenderness/mass/nodules and Bilateral carotid bruits Lungs: clear to auscultation bilaterally Heart: 2/6 outflow tract murmur consistent with aortic stenosis Extremities: extremities normal, atraumatic, no cyanosis or edema Pulses: Diminished pedal pulses Skin: Gangrene left fourth toe Neurologic: Alert and oriented X 3, normal strength and tone. Normal symmetric reflexes. Normal coordination and gait  EKG not performed today  ASSESSMENT AND PLAN:   Critical limb ischemia with history of revascularization of same extremity Caleb [name] returns today for early follow-up.  I revascularized his right lower extremity on 08/08/2019 with marked improvement in symptoms and ABIs.  At the time I demonstrated an occluded left popliteal artery, posterior tibial and peroneal with high-grade AT as well although he did have dorsalis pedis filling.  He has had progressive gangrenous changes in his left fourth toe and was seen by Dr. Oneida [name] yesterday who referred him back for consideration of early endovascular therapy for limb salvage.  We have discussed this today and plan on proceeding with endovascular therapy on Monday.      Caleb Harp MD FACP,FACC,FAHA, [facility] 11/15/2019 11:22 AM

## 2019-11-18 ENCOUNTER — Encounter (HOSPITAL_COMMUNITY)
Admission: RE | Disposition: A | Discharge status: Home / Self Care | Payer: Medicare HMO | Source: Home / Self Care | Attending: Cardiovascular Disease

## 2019-11-18 ENCOUNTER — Other Ambulatory Visit: Payer: Self-pay

## 2019-11-18 ENCOUNTER — Ambulatory Visit (HOSPITAL_COMMUNITY)
Admission: RE | Admit: 2019-11-18 | Discharge: 2019-11-19 | Disposition: A | Discharge status: Home / Self Care | Payer: Medicare HMO | Attending: Cardiovascular Disease | Admitting: Cardiovascular Disease

## 2019-11-18 ENCOUNTER — Encounter (HOSPITAL_COMMUNITY): Payer: Self-pay | Admitting: Cardiovascular Disease

## 2019-11-18 DIAGNOSIS — E663 Overweight: Secondary | ICD-10-CM | POA: Diagnosis not present

## 2019-11-18 DIAGNOSIS — I10 Essential (primary) hypertension: Secondary | ICD-10-CM | POA: Diagnosis not present

## 2019-11-18 DIAGNOSIS — Z79899 Other long term (current) drug therapy: Secondary | ICD-10-CM | POA: Diagnosis not present

## 2019-11-18 DIAGNOSIS — Z6828 Body mass index (BMI) 28.0-28.9, adult: Secondary | ICD-10-CM | POA: Diagnosis not present

## 2019-11-18 DIAGNOSIS — E1152 Type 2 diabetes mellitus with diabetic peripheral angiopathy with gangrene: Secondary | ICD-10-CM | POA: Insufficient documentation

## 2019-11-18 DIAGNOSIS — Z7902 Long term (current) use of antithrombotics/antiplatelets: Secondary | ICD-10-CM | POA: Diagnosis not present

## 2019-11-18 DIAGNOSIS — L97529 Non-pressure chronic ulcer of other part of left foot with unspecified severity: Secondary | ICD-10-CM | POA: Diagnosis not present

## 2019-11-18 DIAGNOSIS — E11621 Type 2 diabetes mellitus with foot ulcer: Secondary | ICD-10-CM | POA: Insufficient documentation

## 2019-11-18 DIAGNOSIS — Z794 Long term (current) use of insulin: Secondary | ICD-10-CM | POA: Diagnosis not present

## 2019-11-18 DIAGNOSIS — I70229 Atherosclerosis of native arteries of extremities with rest pain, unspecified extremity: Secondary | ICD-10-CM | POA: Diagnosis present

## 2019-11-18 DIAGNOSIS — E785 Hyperlipidemia, unspecified: Secondary | ICD-10-CM | POA: Diagnosis present

## 2019-11-18 DIAGNOSIS — I70212 Atherosclerosis of native arteries of extremities with intermittent claudication, left leg: Secondary | ICD-10-CM

## 2019-11-18 DIAGNOSIS — I70262 Atherosclerosis of native arteries of extremities with gangrene, left leg: Secondary | ICD-10-CM | POA: Insufficient documentation

## 2019-11-18 DIAGNOSIS — Z9889 Other specified postprocedural states: Secondary | ICD-10-CM

## 2019-11-18 DIAGNOSIS — Z87891 Personal history of nicotine dependence: Secondary | ICD-10-CM | POA: Insufficient documentation

## 2019-11-18 DIAGNOSIS — Z7982 Long term (current) use of aspirin: Secondary | ICD-10-CM | POA: Diagnosis not present

## 2019-11-18 DIAGNOSIS — Z8249 Family history of ischemic heart disease and other diseases of the circulatory system: Secondary | ICD-10-CM | POA: Diagnosis not present

## 2019-11-18 DIAGNOSIS — I350 Nonrheumatic aortic (valve) stenosis: Secondary | ICD-10-CM | POA: Diagnosis not present

## 2019-11-18 DIAGNOSIS — E119 Type 2 diabetes mellitus without complications: Secondary | ICD-10-CM

## 2019-11-18 HISTORY — PX: PERIPHERAL VASCULAR INTERVENTION: CATH118257

## 2019-11-18 HISTORY — PX: ABDOMINAL AORTOGRAM W/LOWER EXTREMITY: CATH118223

## 2019-11-18 LAB — GLUCOSE, CAPILLARY
Glucose-Capillary: 99 mg/dL (ref 70–99)
Glucose-Capillary: 90 mg/dL (ref 70–99)
Glucose-Capillary: 183 mg/dL — ABNORMAL HIGH (ref 70–99)
Glucose-Capillary: 161 mg/dL — ABNORMAL HIGH (ref 70–99)
Glucose-Capillary: 228 mg/dL — ABNORMAL HIGH (ref 70–99)

## 2019-11-18 LAB — POCT ACTIVATED CLOTTING TIME
Activated Clotting Time: 257 seconds
Activated Clotting Time: 263 seconds
Activated Clotting Time: 274 seconds

## 2019-11-18 LAB — HEMOGLOBIN A1C
Hgb A1c MFr Bld: 7.6 % — ABNORMAL HIGH (ref 4.8–5.6)
Mean Plasma Glucose: 171.42 mg/dL

## 2019-11-18 SURGERY — ABDOMINAL AORTOGRAM W/LOWER EXTREMITY
Anesthesia: LOCAL

## 2019-11-18 MED ORDER — SODIUM CHLORIDE 0.9 % IV SOLN: 250.0000 mL | INTRAVENOUS | Status: DC | PRN

## 2019-11-18 MED ORDER — LIDOCAINE HCL (PF) 1 % IJ SOLN
INTRAMUSCULAR | Status: DC | PRN
  Administered 2019-11-18: 15 mL via SUBCUTANEOUS

## 2019-11-18 MED ORDER — LABETALOL HCL 5 MG/ML IV SOLN
10.0000 mg | INTRAVENOUS | Status: DC | PRN
  Administered 2019-11-18: 10 mg via INTRAVENOUS

## 2019-11-18 MED ORDER — HEPARIN SODIUM (PORCINE) 1000 UNIT/ML IJ SOLN
INTRAMUSCULAR | Status: DC | PRN
  Administered 2019-11-18: 2000 [IU] via INTRAVENOUS
  Administered 2019-11-18: 10000 [IU] via INTRAVENOUS
  Administered 2019-11-18: 3000 [IU] via INTRAVENOUS

## 2019-11-18 MED ORDER — LABETALOL HCL 5 MG/ML IV SOLN
INTRAVENOUS | Status: AC
  Filled 2019-11-18: qty 4

## 2019-11-18 MED ORDER — CLOPIDOGREL BISULFATE 75 MG PO TABS: 75.0000 mg | ORAL_TABLET | Freq: Every day | ORAL | Status: DC

## 2019-11-18 MED ORDER — LISINOPRIL 40 MG PO TABS
40.0000 mg | ORAL_TABLET | Freq: Two times a day (BID) | ORAL | Status: DC
  Administered 2019-11-18 – 2019-11-19 (×2): 40 mg via ORAL
  Filled 2019-11-18 (×2): qty 1

## 2019-11-18 MED ORDER — FENTANYL CITRATE (PF) 100 MCG/2ML IJ SOLN
INTRAMUSCULAR | Status: DC | PRN
Start: 2019-11-18 — End: 2019-11-18
  Administered 2019-11-18: 25 ug via INTRAVENOUS

## 2019-11-18 MED ORDER — DOXYCYCLINE HYCLATE 100 MG PO TABS: 100.0000 mg | ORAL_TABLET | Freq: Two times a day (BID) | ORAL | Status: DC

## 2019-11-18 MED ORDER — HEPARIN SODIUM (PORCINE) 1000 UNIT/ML IJ SOLN
INTRAMUSCULAR | Status: AC
  Filled 2019-11-18: qty 1

## 2019-11-18 MED ORDER — ACETAMINOPHEN 325 MG PO TABS: 650.0000 mg | ORAL_TABLET | ORAL | Status: DC | PRN

## 2019-11-18 MED ORDER — ROSUVASTATIN CALCIUM 20 MG PO TABS
40.0000 mg | ORAL_TABLET | Freq: Every day | ORAL | Status: DC
  Administered 2019-11-19: 40 mg via ORAL
  Filled 2019-11-18: qty 2

## 2019-11-18 MED ORDER — ASPIRIN EC 81 MG PO TBEC: 81.0000 mg | DELAYED_RELEASE_TABLET | Freq: Every day | ORAL | Status: DC

## 2019-11-18 MED ORDER — SODIUM CHLORIDE 0.9% FLUSH: 3.0000 mL | INTRAVENOUS | Status: DC | PRN

## 2019-11-18 MED ORDER — ASPIRIN EC 81 MG PO TBEC
81.0000 mg | DELAYED_RELEASE_TABLET | Freq: Every day | ORAL | Status: DC
  Administered 2019-11-19: 81 mg via ORAL
  Filled 2019-11-18: qty 1

## 2019-11-18 MED ORDER — SODIUM CHLORIDE 0.9 % WEIGHT BASED INFUSION
3.0000 mL/kg/h | INTRAVENOUS | Status: DC
  Administered 2019-11-18: 3 mL/kg/h via INTRAVENOUS

## 2019-11-18 MED ORDER — LISINOPRIL-HYDROCHLOROTHIAZIDE 20-12.5 MG PO TABS: 2.0000 | ORAL_TABLET | Freq: Every day | ORAL | Status: DC

## 2019-11-18 MED ORDER — FENTANYL CITRATE (PF) 100 MCG/2ML IJ SOLN
INTRAMUSCULAR | Status: AC
  Filled 2019-11-18: qty 2

## 2019-11-18 MED ORDER — MIDAZOLAM HCL 2 MG/2ML IJ SOLN
INTRAMUSCULAR | Status: DC | PRN
  Administered 2019-11-18: 1 mg via INTRAVENOUS

## 2019-11-18 MED ORDER — SODIUM CHLORIDE 0.9 % IV SOLN: INTRAVENOUS | Status: AC

## 2019-11-18 MED ORDER — LIDOCAINE HCL (PF) 1 % IJ SOLN
INTRAMUSCULAR | Status: AC
  Filled 2019-11-18: qty 30

## 2019-11-18 MED ORDER — SODIUM CHLORIDE 0.9% FLUSH
3.0000 mL | Freq: Two times a day (BID) | INTRAVENOUS | Status: DC
  Administered 2019-11-18 – 2019-11-19 (×2): 3 mL via INTRAVENOUS

## 2019-11-18 MED ORDER — HEPARIN (PORCINE) IN NACL 1000-0.9 UT/500ML-% IV SOLN
INTRAVENOUS | Status: DC | PRN
  Administered 2019-11-18 (×2): 500 mL

## 2019-11-18 MED ORDER — IODIXANOL 320 MG/ML IV SOLN
INTRAVENOUS | Status: DC | PRN
  Administered 2019-11-18: 150 mL via INTRA_ARTERIAL

## 2019-11-18 MED ORDER — ASPIRIN 81 MG PO CHEW: 81.0000 mg | CHEWABLE_TABLET | ORAL | Status: DC

## 2019-11-18 MED ORDER — METOPROLOL TARTRATE 100 MG PO TABS
100.0000 mg | ORAL_TABLET | Freq: Two times a day (BID) | ORAL | Status: DC
  Administered 2019-11-18 – 2019-11-19 (×2): 100 mg via ORAL
  Filled 2019-11-18 (×2): qty 1

## 2019-11-18 MED ORDER — ATORVASTATIN CALCIUM 80 MG PO TABS: 80.0000 mg | ORAL_TABLET | Freq: Every day | ORAL | Status: DC

## 2019-11-18 MED ORDER — CLOPIDOGREL BISULFATE 75 MG PO TABS
75.0000 mg | ORAL_TABLET | Freq: Every day | ORAL | Status: DC
  Administered 2019-11-19: 75 mg via ORAL
  Filled 2019-11-18: qty 1

## 2019-11-18 MED ORDER — HYDROCHLOROTHIAZIDE 25 MG PO TABS
25.0000 mg | ORAL_TABLET | Freq: Two times a day (BID) | ORAL | Status: DC
  Administered 2019-11-18 – 2019-11-19 (×2): 25 mg via ORAL
  Filled 2019-11-18 (×2): qty 1

## 2019-11-18 MED ORDER — SODIUM CHLORIDE 0.9 % WEIGHT BASED INFUSION: 1.0000 mL/kg/h | INTRAVENOUS | Status: DC

## 2019-11-18 MED ORDER — HYDRALAZINE HCL 20 MG/ML IJ SOLN: 5.0000 mg | INTRAMUSCULAR | Status: DC | PRN

## 2019-11-18 MED ORDER — INSULIN ASPART 100 UNIT/ML ~~LOC~~ SOLN
0.0000 [IU] | Freq: Three times a day (TID) | SUBCUTANEOUS | Status: DC
  Administered 2019-11-18 (×2): 3 [IU] via SUBCUTANEOUS
  Administered 2019-11-19: 2 [IU] via SUBCUTANEOUS

## 2019-11-18 MED ORDER — ONDANSETRON HCL 4 MG/2ML IJ SOLN: 4.0000 mg | Freq: Four times a day (QID) | INTRAMUSCULAR | Status: DC | PRN

## 2019-11-18 MED ORDER — MUSCLE RUB 10-15 % EX CREA
TOPICAL_CREAM | CUTANEOUS | Status: DC | PRN
  Filled 2019-11-18: qty 85

## 2019-11-18 MED ORDER — DOXYCYCLINE HYCLATE 100 MG PO TABS
100.0000 mg | ORAL_TABLET | Freq: Two times a day (BID) | ORAL | Status: DC
  Administered 2019-11-18 – 2019-11-19 (×2): 100 mg via ORAL
  Filled 2019-11-18 (×2): qty 1

## 2019-11-18 MED ORDER — MIDAZOLAM HCL 2 MG/2ML IJ SOLN
INTRAMUSCULAR | Status: AC
  Filled 2019-11-18: qty 2

## 2019-11-18 MED ORDER — HEPARIN (PORCINE) IN NACL 1000-0.9 UT/500ML-% IV SOLN
INTRAVENOUS | Status: AC
  Filled 2019-11-18: qty 1000

## 2019-11-18 SURGICAL SUPPLY — 29 items
BALLN JADE .018 4.0 X 100 (BALLOONS) ×3
BALLN JADE .018 4.0 X 40 (BALLOONS) ×3
BALLOON JADE .018 4.0 X 100 (BALLOONS) IMPLANT
BALLOON JADE .018 4.0 X 40 (BALLOONS) IMPLANT
CATH 0.018 NAVICROSS ANG 135 (CATHETERS) ×1 IMPLANT
CATH ANGIO 5F PIGTAIL 65CM (CATHETERS) IMPLANT
CATH CROSS OVER TEMPO 5F (CATHETERS) ×1 IMPLANT
CATH QUICKCROSS .035X135CM (MICROCATHETER) ×1 IMPLANT
DEVICE CLOSURE PERCLS PRGLD 6F (VASCULAR PRODUCTS) IMPLANT
GUIDEWIRE ANGLED .035X150CM (WIRE) ×1 IMPLANT
GUIDEWIRE ZILIENT 6G 018 (WIRE) ×1 IMPLANT
KIT ENCORE 26 ADVANTAGE (KITS) ×1 IMPLANT
KIT PV (KITS) ×3 IMPLANT
PERCLOSE PROGLIDE 6F (VASCULAR PRODUCTS) ×3
SHEATH HIGHFLEX ANSEL 7FR 55CM (SHEATH) ×1 IMPLANT
SHEATH PINNACLE 5F 10CM (SHEATH) ×1 IMPLANT
SHEATH PINNACLE 7F 10CM (SHEATH) ×1 IMPLANT
SHEATH PROBE COVER 6X72 (BAG) ×1 IMPLANT
STENT TIGRIS 5X60X120 (Permanent Stent) ×1 IMPLANT
STOPCOCK MORSE 400PSI 3WAY (MISCELLANEOUS) ×2 IMPLANT
SYR MEDRAD MARK 7 150ML (SYRINGE) ×3 IMPLANT
TAPE VIPERTRACK RADIOPAQ (MISCELLANEOUS) IMPLANT
TAPE VIPERTRACK RADIOPAQUE (MISCELLANEOUS) ×3
TRANSDUCER W/STOPCOCK (MISCELLANEOUS) ×3 IMPLANT
TRAY PV CATH (CUSTOM PROCEDURE TRAY) ×3 IMPLANT
TUBING CIL FLEX 10 FLL-RA (TUBING) ×1 IMPLANT
WIRE G V18X300CM (WIRE) ×1 IMPLANT
WIRE HITORQ VERSACORE ST 145CM (WIRE) ×1 IMPLANT
WIRE ROSEN-J .035X180CM (WIRE) ×1 IMPLANT

## 2019-11-18 NOTE — Progress Notes (Signed)
Additional manual pressure held for 15 mins   Right groin remain a level 0.

## 2019-11-18 NOTE — Progress Notes (Signed)
Patient arrived from cath lab at 1350hrs, oriented to unit and plan of care for shift.  Right femoral site level zero.  Able to doppler bilateral dorsalis pedal pulses.  Patient given post procedure instructions, verbalized understanding.

## 2019-11-18 NOTE — Interval H&P Note (Signed)
History and Physical Interval Note:  11/18/2019 7:48 AM  Caleb [name]  has presented today for surgery, with the diagnosis of PAD.  The various methods of treatment have been discussed with the patient and family. After consideration of risks, benefits and other options for treatment, the patient has consented to  Procedure(s): ABDOMINAL AORTOGRAM W/LOWER EXTREMITY (N/A) as a surgical intervention.  The patient's history has been reviewed, patient examined, no change in status, stable for surgery.  I have reviewed the patient's chart and labs.  Questions were answered to the patient's satisfaction.     Quay Burow

## 2019-11-19 ENCOUNTER — Encounter: Payer: Self-pay | Admitting: Podiatry

## 2019-11-19 DIAGNOSIS — I350 Nonrheumatic aortic (valve) stenosis: Secondary | ICD-10-CM

## 2019-11-19 DIAGNOSIS — E11621 Type 2 diabetes mellitus with foot ulcer: Secondary | ICD-10-CM | POA: Diagnosis not present

## 2019-11-19 DIAGNOSIS — I70262 Atherosclerosis of native arteries of extremities with gangrene, left leg: Secondary | ICD-10-CM | POA: Diagnosis not present

## 2019-11-19 DIAGNOSIS — I5042 Chronic combined systolic (congestive) and diastolic (congestive) heart failure: Secondary | ICD-10-CM

## 2019-11-19 DIAGNOSIS — I998 Other disorder of circulatory system: Secondary | ICD-10-CM

## 2019-11-19 DIAGNOSIS — L97529 Non-pressure chronic ulcer of other part of left foot with unspecified severity: Secondary | ICD-10-CM | POA: Diagnosis not present

## 2019-11-19 DIAGNOSIS — E785 Hyperlipidemia, unspecified: Secondary | ICD-10-CM | POA: Diagnosis not present

## 2019-11-19 DIAGNOSIS — I10 Essential (primary) hypertension: Secondary | ICD-10-CM | POA: Diagnosis not present

## 2019-11-19 DIAGNOSIS — Z8249 Family history of ischemic heart disease and other diseases of the circulatory system: Secondary | ICD-10-CM | POA: Diagnosis not present

## 2019-11-19 DIAGNOSIS — E663 Overweight: Secondary | ICD-10-CM | POA: Diagnosis not present

## 2019-11-19 DIAGNOSIS — E1152 Type 2 diabetes mellitus with diabetic peripheral angiopathy with gangrene: Secondary | ICD-10-CM | POA: Diagnosis not present

## 2019-11-19 LAB — BASIC METABOLIC PANEL
Anion gap: 9 (ref 5–15)
BUN: 14 mg/dL (ref 8–23)
CO2: 25 mmol/L (ref 22–32)
Calcium: 8.9 mg/dL (ref 8.9–10.3)
Chloride: 102 mmol/L (ref 98–111)
Creatinine, Ser: 1.1 mg/dL (ref 0.61–1.24)
GFR calc Af Amer: >60 mL/min (ref >60)
GFR calc non Af Amer: >60 mL/min (ref >60)
Glucose, Bld: 175 mg/dL — ABNORMAL HIGH (ref 70–99)
Potassium: 4 mmol/L (ref 3.5–5.1)
Sodium: 136 mmol/L (ref 135–145)

## 2019-11-19 LAB — CBC
HCT: 35.8 % — ABNORMAL LOW (ref 39.0–52.0)
Hemoglobin: 11.3 g/dL — ABNORMAL LOW (ref 13.0–17.0)
MCH: 27 pg (ref 26.0–34.0)
MCHC: 31.6 g/dL (ref 30.0–36.0)
MCV: 85.4 fL (ref 80.0–100.0)
Platelets: 296 10*3/uL (ref 150–400)
RBC: 4.19 MIL/uL — ABNORMAL LOW (ref 4.22–5.81)
RDW: 13.8 % (ref 11.5–15.5)
WBC: 6.5 10*3/uL (ref 4.0–10.5)
nRBC: 0 % (ref 0.0–0.2)

## 2019-11-19 LAB — HEMOGLOBIN AND HEMATOCRIT, BLOOD
HCT: 36.5 % — ABNORMAL LOW (ref 39.0–52.0)
Hemoglobin: 11.7 g/dL — ABNORMAL LOW (ref 13.0–17.0)

## 2019-11-19 LAB — GLUCOSE, CAPILLARY: Glucose-Capillary: 147 mg/dL — ABNORMAL HIGH (ref 70–99)

## 2019-11-19 NOTE — Discharge Summary (Signed)
Discharge Summary    Patient ID: Caleb [name] MRN: [id number]; DOB: May 25, 1950  Admit date: 11/18/2019 Discharge date: 11/19/2019  Primary Care Provider: [name], MD  Primary Cardiologist: Quay Burow, MD  Primary Electrophysiologist:  None   Discharge Diagnoses    Principal Problem:   Critical ischemia of foot Active Problems:   Diabetes mellitus ([place])   Hypertension   Dyslipidemia   Critical limb ischemia with history of revascularization of same extremity    Diagnostic Studies/Procedures    Peripheral vascular catheterization 11/18/19 Procedures Performed:               1.  Ultrasound-guided right common femoral access               2.  Contralateral access (secondary catheter placement)               3.  PTA and stenting of a left popliteal CTO in the P1 and P2 segments with a Tigris self-expanding stent               4.  Angiogram right common femoral artery followed by Perclose closure   IMPRESSION: Successful PTA and stenting of a left SFA CTO for critical limb ischemia.  There was a small perforation that was sealed with prolonged low-pressure balloon inflation and stented with a Tigris self-expanding stent.  Patient was already on aspirin Plavix.  He will be gently hydrated overnight, discharged home the morning.  Will get lower extremity arterial Doppler studies in our [facility] office next week and I will see him back the week after.   _____________   History of Present Illness     Caleb [name] is a 69 y.o. male with pmh of HTN, DM2, HLD, mod to severe AS by echo (08/2019), stress test 09/10/19 with no ischemia, Carotid artery disease (R>L high-grade stenosis), and PVD with prior PCI on the right side who was admitted for peripheral vascular cath for Left critical limb ischemia. The patient had previous PCI 08/08/19 with atherectomy and drug-coated angioplasty of high-grade SFA and popliteal stenosis with 2-vessel runoff which improved claudication  and wound healing of the right foot. It was noted he had occluded left popliteal artery at that time. He saw Dr. Oneida Alar for progressive gangrene in the left fourth toe who referred him back to Dr. Gwenlyn Found. Angiogram as above showed occluded left popliteal artery with occluded posterior tibial and peroneal and high-grade AT disease. The patient was seen in the office 11/15/19 subsequently scheduled for angiography with possible intervention.   Hospital Course     Consultants: None  The patient arrived to his scheduled procedure 11/18/19. He underwent US-guided right common femoral access. The patient was treated with PTA stenting of a left popliteal CTO in the P1 and P2 segments with Tigris self-expanding stent. There was a small perforation that was sealed with prolonged low-pressure balloon inflation and stented with Tigris self-expanding stent. Angiogram was used on right common femoral artery and a Perclose device was successfully deployed. The patient tolerated the procedure well and kept overnight for hydration. Plan to continue home aspirin and plavix. Right femoral cath site remained stable with minimal bruising, no hematoma or bruit. Labs came back at creatinine 1.10 and Hgb 11.3. Due to significant drop in hemoblobin Stat H/H repeated which came back at 11.7/36.5.  Will plan for follow-up lower extremity dopplers next week with follow-up.   The patient was evaluated by Dr. Oval Linsey on 11/19/19 and felt to be stable  for discharge.   General appearance: alert and no distress Neck: no adenopathy, no JVD, supple, symmetrical, trachea midline, thyroid not enlarged, symmetric, no tenderness/mass/nodules and Bilateral carotid bruits Lungs: clear to auscultation bilaterally Heart:+murmur Extremities: extremities normal, atraumatic, no cyanosis or edema Pulses: Diminished pedal pulses Skin: Gangrene left fourth toe Neurologic: Alert and oriented X 3, normal strength and tone. Normal symmetric reflexes.  Normal coordination and gait  Did the patient have an acute coronary syndrome (MI, NSTEMI, STEMI, etc) this admission?:  No                               Did the patient have a percutaneous coronary intervention (stent / angioplasty)?:  No.   _____________  Discharge Vitals Blood pressure 122/90, pulse 89, temperature 98.5 F (36.9 C), temperature source Oral, resp. rate 16, height '5\' 8"'  (1.727 m), weight 84.2 kg, SpO2 99 %.  Filed Weights   11/18/19 0544 11/19/19 0500  Weight: 85.7 kg 84.2 kg    Labs & Radiologic Studies    CBC Recent Labs    11/19/19 0326 11/19/19 0948  WBC 6.5  --   HGB 11.3* 11.7*  HCT 35.8* 36.5*  MCV 85.4  --   PLT 296  --    Basic Metabolic Panel Recent Labs    11/19/19 0326  NA 136  K 4.0  CL 102  CO2 25  GLUCOSE 175*  BUN 14  CREATININE 1.10  CALCIUM 8.9   Liver Function Tests No results for input(s): AST, ALT, ALKPHOS, BILITOT, PROT, ALBUMIN in the last 72 hours. No results for input(s): LIPASE, AMYLASE in the last 72 hours. High Sensitivity Troponin:   No results for input(s): TROPONINIHS in the last 720 hours.  BNP Invalid input(s): POCBNP D-Dimer No results for input(s): DDIMER in the last 72 hours. Hemoglobin A1C Recent Labs    11/18/19 1437  HGBA1C 7.6*   Fasting Lipid Panel No results for input(s): CHOL, HDL, LDLCALC, TRIG, CHOLHDL, LDLDIRECT in the last 72 hours. Thyroid Function Tests No results for input(s): TSH, T4TOTAL, T3FREE, THYROIDAB in the last 72 hours.  Invalid input(s): FREET3 _____________  CT ANGIO HEAD W OR WO CONTRAST  Result Date: 11/13/2019 CLINICAL DATA:  Carotid stenosis EXAM: CT ANGIOGRAPHY HEAD AND NECK TECHNIQUE: Multidetector CT imaging of the head and neck was performed using the standard protocol during bolus administration of intravenous contrast. Multiplanar CT image reconstructions and MIPs were obtained to evaluate the vascular anatomy. Carotid stenosis measurements (when applicable) are  obtained utilizing NASCET criteria, using the distal internal carotid diameter as the denominator. CONTRAST:  71m ISOVUE-300 IOPAMIDOL (ISOVUE-300) INJECTION 61% COMPARISON:  None. FINDINGS: CT HEAD Brain: There is no acute intracranial hemorrhage, mass effect, or edema. Gray-white differentiation is preserved. There is no extra-axial fluid collection. Ventricles and sulci are within normal limits in size and configuration. Patchy hypoattenuation in the supratentorial white matter is nonspecific but may reflect mild chronic microvascular ischemic changes. Vascular: Better evaluated on CTA. Skull: Calvarium is unremarkable. Sinuses/Orbits: Obtained secretions within the right sphenoid sinus. Orbits are unremarkable. Other: None. Review of the MIP images confirms the above findings CTA NECK Aortic arch: The great vessel origins are patent. Right carotid system: Patent. There is mixed plaque at the ICA origin. The noncalcified portion causes approximately 72% stenosis. Left carotid system: Patent. There is mixed but primarily noncalcified plaque at the ICA origin. This results in approximately 70% stenosis. Vertebral arteries: Patent.  Left vertebral artery is dominant. Skeleton: Degenerative changes of the cervical spine. Other neck: No mass or adenopathy Upper chest: No apical lung mass. Review of the MIP images confirms the above findings CTA HEAD Anterior circulation: Intracranial internal carotid arteries are patent with mild calcified plaque. Anterior and middle cerebral arteries are patent. Posterior circulation: Intracranial vertebral arteries, basilar artery, and posterior cerebral arteries are patent. There are patent bilateral posterior communicating arteries. Venous sinuses: Patent as allowed by contrast bolus timing. Review of the MIP images confirms the above findings IMPRESSION: No acute intracranial abnormality. Mild chronic microvascular ischemic changes. Mixed but primarily noncalcified plaque at the  ICA origins causing approximately 70% stenosis. No significant intracranial stenosis. Electronically Signed   By: Macy Mis M.D.   On: 11/13/2019 11:15   CT ANGIO NECK W OR WO CONTRAST  Result Date: 11/13/2019 CLINICAL DATA:  Carotid stenosis EXAM: CT ANGIOGRAPHY HEAD AND NECK TECHNIQUE: Multidetector CT imaging of the head and neck was performed using the standard protocol during bolus administration of intravenous contrast. Multiplanar CT image reconstructions and MIPs were obtained to evaluate the vascular anatomy. Carotid stenosis measurements (when applicable) are obtained utilizing NASCET criteria, using the distal internal carotid diameter as the denominator. CONTRAST:  76m ISOVUE-300 IOPAMIDOL (ISOVUE-300) INJECTION 61% COMPARISON:  None. FINDINGS: CT HEAD Brain: There is no acute intracranial hemorrhage, mass effect, or edema. Gray-white differentiation is preserved. There is no extra-axial fluid collection. Ventricles and sulci are within normal limits in size and configuration. Patchy hypoattenuation in the supratentorial white matter is nonspecific but may reflect mild chronic microvascular ischemic changes. Vascular: Better evaluated on CTA. Skull: Calvarium is unremarkable. Sinuses/Orbits: Obtained secretions within the right sphenoid sinus. Orbits are unremarkable. Other: None. Review of the MIP images confirms the above findings CTA NECK Aortic arch: The great vessel origins are patent. Right carotid system: Patent. There is mixed plaque at the ICA origin. The noncalcified portion causes approximately 72% stenosis. Left carotid system: Patent. There is mixed but primarily noncalcified plaque at the ICA origin. This results in approximately 70% stenosis. Vertebral arteries: Patent.  Left vertebral artery is dominant. Skeleton: Degenerative changes of the cervical spine. Other neck: No mass or adenopathy Upper chest: No apical lung mass. Review of the MIP images confirms the above findings  CTA HEAD Anterior circulation: Intracranial internal carotid arteries are patent with mild calcified plaque. Anterior and middle cerebral arteries are patent. Posterior circulation: Intracranial vertebral arteries, basilar artery, and posterior cerebral arteries are patent. There are patent bilateral posterior communicating arteries. Venous sinuses: Patent as allowed by contrast bolus timing. Review of the MIP images confirms the above findings IMPRESSION: No acute intracranial abnormality. Mild chronic microvascular ischemic changes. Mixed but primarily noncalcified plaque at the ICA origins causing approximately 70% stenosis. No significant intracranial stenosis. Electronically Signed   By: PMacy MisM.D.   On: 11/13/2019 11:15   PERIPHERAL VASCULAR CATHETERIZATION  Result Date: 11/18/2019  [id number]:  [facility] JQuay Burow M.D. [id number]-Dec-1952DATE OF PROCEDURE:  11/18/2019 DATE OF DISCHARGE: PV Angiogram/Intervention History obtained from chart review.JSAMIE REASONSis a 69y.o.  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 GAdah Perl his podiatrist for evaluation of critical limb ischemia.I last saw him in the office 09/06/2019.His wife of 416years did die of ovarian cancer 3 years ago but he is very positive about meeting somebody new in his  life. He is very active and swims 4 days a week, walks 3 to 4 days a week and belongs to fitness. He denies chest pain or shortness of breath. His cardiac risk factors are notable for treated hypertension, diabetes and hyperlipidemia. His father did die of a myocardial infarction age 82. He is never had a heart attack or stroke. He does complain of lifestyle and claudication however. He developed wounds on his toes after walking 5 miles about [facility] with new shoes and has been treated by Dr. Adah Perl  since that time with slow progression of wound healing.  I performed peripheral angiography on him 08/08/2019 and performed atherectomy and drug-coated balloon angioplasty of high-grade right SFA and popliteal stenosis with two-vessel runoff. This resulted in an improvement in his claudication symptoms, healing of his right foot wounds and improvement in his Doppler studies. He did have an occluded left popliteal artery which I will address in a staged fashion in the near future although his wounds on his left foot are slowly healing. I also performed carotid Doppler studies that showed high-grade right greater than left ICA stenosis and have referred him to vascular surgery for consideration of endarterectomy. He denies chest pain or shortness of breath. He does have mild aortic stenosis by 2D echo 2 years ago and a high-pitched outflow tract murmur. We check a 2D echo on 08/27/2019 that showed a decline in his EF from 50 to 55% down to 40 to 45% with moderate to severe aortic stenosis.  A Myoview stress test performed 08/30/2019 showed inferior scar without ischemia.  He really denies chest pain or shortness of breath.  He is referred back by Dr. Oneida Alar because of progressive gangrene in his left fourth toe.  His angiogram which I performed 08/08/2019 did show an occluded left popliteal artery with occluded posterior tibial and peroneal and high-grade AT disease.  I am going to attempt endovascular therapy on Monday for limb salvage. Pre Procedure Diagnosis: Peripheral arterial disease Post Procedure Diagnosis: Peripheral arterial disease Operators: Dr. Quay Burow Procedures Performed:  1.  Ultrasound-guided right common femoral access  2.  Contralateral access (secondary catheter placement)  3.  PTA and stenting of a left popliteal CTO in the P1 and P2 segments with a Tigris self-expanding stent  4.  Angiogram right common femoral artery followed by Perclose closure PROCEDURE DESCRIPTION: The patient was  brought to the second floor [facility] cath lab in the the postabsorptive state. He was premedicated with IV Versed and fentanyl. His right groin was prepped and shaved in usual sterile fashion. Xylocaine 1% was used for local anesthesia. A 5 French sheath was inserted into the right common femoral artery using standard Seldinger technique.  Ultrasound was used to identify the right common femoral artery and guide access.  A digital image was captured and placed the patient's chart.  Contralateral access was obtained with a crossover catheter, Glidewire, Rosen wire and a 7 French 55 cm multipurpose Ansell sheath which was placed in the left SFA.  Omnipaque dye was used for the entirety of the intervention.  Retroaortic pressures monitored in the case.  The patient was already on aspirin Plavix. I was able to cross the CTO with a 035 quick cross catheter and an 018V 18 CTO wire.  This did result in a microperforation with extravasation that was sealed with a 4 mm x 100 mm long J balloon with low pressure inflation for 10 minutes.  This completely sealed the perforation and  there was no extravasation.  I then stented the CTO segment with a 5 mm x 60 mm long Tigris self-expanding stent and postdilated with a 4 mm x 40 mm long noncompliant balloon resulting in reduction of a total occlusion of the P1 and P2 segment to 0% residual with excellent flow down the anterior tibial artery.  There was a palpable pedal pulse at the end of the case.  The patient did receive a total of 15,000 units of heparin with an ACT of 263.  Total contrast administered the patient was 150 cc. The right common femoral angiogram was performed and a Perclose device was successfully deployed achieving hemostasis.   Successful PTA and stenting of a left SFA CTO for critical limb ischemia.  There was a small perforation that was sealed with prolonged low-pressure balloon inflation and stented with a Tigris self-expanding stent.  Patient  was already on aspirin Plavix.  He will be gently hydrated overnight, discharged home the morning.  Will get lower extremity arterial Doppler studies in our [facility] [employer] office next week and I will see him back the week after. Quay Burow. MD, [facility] 11/18/2019 9:54 AM   VAS US CAROTID  Result Date: 11/07/2019 Carotid Arterial Duplex Study Indications:                           Bilateral bruits and Carotid artery                                        disease. Risk Factors:                          Hypertension, hyperlipidemia, Diabetes,                                        past history of smoking. Pre-Surgical Evaluation & Surgical     Stenosis at RICA only. ICA is normal past Correlation                            the stenosis. Anatomy on the right is                                        within normal limits.Right bifurcation is                                        located near the mandible. Stenosis at                                        left ICA only. ICA is normal past the                                        stenosis. Anatomy on the left is within  normal limits.Left bifurcation is located                                        near the mandible. Performing Technologist: Alvia Grove RVT  Examination Guidelines: A complete evaluation includes B-mode imaging, spectral Doppler, color Doppler, and power Doppler as needed of all accessible portions of each vessel. Bilateral testing is considered an integral part of a complete examination. Limited examinations for reoccurring indications may be performed as noted.  Right Carotid Findings: +----------+--------+--------+--------+------------------+--------+           PSV cm/sEDV cm/sStenosisPlaque DescriptionComments +----------+--------+--------+--------+------------------+--------+ CCA Prox  97      12                                          +----------+--------+--------+--------+------------------+--------+ CCA Mid   123     18                                         +----------+--------+--------+--------+------------------+--------+ CCA Distal106     18                                         +----------+--------+--------+--------+------------------+--------+ ICA Prox  476     200     80-99%  heterogenous               +----------+--------+--------+--------+------------------+--------+ ICA Mid   86      20                                         +----------+--------+--------+--------+------------------+--------+ ICA Distal69      22                                         +----------+--------+--------+--------+------------------+--------+ ECA       103     3                                          +----------+--------+--------+--------+------------------+--------+ +----------+--------+-------+----------------+-------------------+           PSV cm/sEDV cmsDescribe        Arm Pressure (mmHG) +----------+--------+-------+----------------+-------------------+ Subclavian151     3      Multiphasic, WNL                    +----------+--------+-------+----------------+-------------------+ +---------+--------+--+--------+--+---------+ VertebralPSV cm/s58EDV cm/s19Antegrade +---------+--------+--+--------+--+---------+  Left Carotid Findings: +----------+--------+--------+--------+------------------+--------+           PSV cm/sEDV cm/sStenosisPlaque DescriptionComments +----------+--------+--------+--------+------------------+--------+ CCA Prox  172     14                                         +----------+--------+--------+--------+------------------+--------+ CCA Mid   141     14  heterogenous               +----------+--------+--------+--------+------------------+--------+ CCA Distal96      17                                          +----------+--------+--------+--------+------------------+--------+ ICA Prox  436     192     80-99%  heterogenous               +----------+--------+--------+--------+------------------+--------+ ICA Mid   87      27                                         +----------+--------+--------+--------+------------------+--------+ ICA Distal69      21                                         +----------+--------+--------+--------+------------------+--------+ ECA       124     2                                          +----------+--------+--------+--------+------------------+--------+ +----------+--------+--------+----------------+-------------------+           PSV cm/sEDV cm/sDescribe        Arm Pressure (mmHG) +----------+--------+--------+----------------+-------------------+ [id number]     3       Multiphasic, WNL                    +----------+--------+--------+----------------+-------------------+ +---------+--------+--+--------+--+---------+ VertebralPSV cm/s68EDV cm/s27Antegrade +---------+--------+--+--------+--+---------+   Summary: Right Carotid: Velocities in the right ICA are consistent with a 80-99%                stenosis. Left Carotid: Velocities in the left ICA are consistent with a 80-99% stenosis. Vertebrals:  Bilateral vertebral arteries demonstrate antegrade flow. Subclavians: Normal flow hemodynamics were seen in bilateral subclavian              arteries. *See table(s) above for measurements and observations.  Electronically signed by Ruta Hinds MD on 11/07/2019 at 2:17:28 PM.    Final    Disposition   Pt is being discharged home today in good condition.  Follow-up Plans & Appointments     Follow-up Information    Lorretta Harp, MD Follow up on 12/02/2019.   Specialties: Cardiology, Radiology Why: @ 10:30AM Contact information: [address] [place] [address] [phone]                Discharge  Medications   Allergies as of 11/19/2019   No Known Allergies     Medication List    TAKE these medications   aspirin EC 81 MG tablet Take 81 mg by mouth daily.   clopidogrel 75 MG tablet Commonly known as: Plavix Take 1 tablet (75 mg total) by mouth daily.   collagenase ointment Commonly known as: SANTYL Apply 1 application topically daily. Apply Santyl Ointment to scab on left 3rd and 4th digits. Measurements: Left 3rd toe 1.0 x 0.6 cm Left 4th toe: 2.0 x 1.0 cm What changed:   when to take this  [name] to take this   doxycycline 100  MG tablet Commonly known as: VIBRA-TABS Take 1 tablet (100 mg total) by mouth 2 (two) times daily.   doxycycline 100 MG tablet Commonly known as: VIBRA-TABS Take 1 tablet (100 mg total) by mouth 2 (two) times daily.   FreeStyle Libre 14 Day Reader Kerrin Mo 1 each by Does not apply route See admin instructions. Use as directed to check blood glucose   FreeStyle Libre 14 Day Sensor Misc INJECT IN THE SKIN EVERY 14 (FOURTEEN) DAYS. E11.69 Z79.4   gabapentin 300 MG capsule Commonly known as: NEURONTIN Take 1 capsule (300 mg total) by mouth 2 (two) times daily. What changed:   how much to take  when to take this  [name] to take this   glucose blood test strip Use as instructed twice daily, check fasting sugar in morning and check before bedtime. E11.8   HumuLIN 70/30 (70-30) 100 UNIT/ML injection Generic drug: insulin NPH-regular Human INJECT 37 UNITS INTO THE SKIN 2 TIMES DAILY WITH A MEAL. What changed:   how much to take  how to take this  when to take this  [name] to take this  additional instructions   lisinopril-hydrochlorothiazide 20-12.5 MG tablet Commonly known as: Zestoretic Take 2 tablets by mouth daily.   metFORMIN 1000 MG tablet Commonly known as: Glucophage Take 1 tablet (1,000 mg total) by mouth 2 (two) times daily with a meal. What changed: when to take this   metoprolol tartrate 100 MG  tablet Commonly known as: LOPRESSOR Take 1 tablet (100 mg total) by mouth 2 (two) times daily.   OneTouch Delica Lancets 17G Misc Use as instructed twice daily, check fasting sugar in morning and check before bedtime. E11.8   OneTouch Verio w/Device Kit 1 each by Does not apply route 2 (two) times daily. Use as instructed twice daily, check fasting sugar in morning and check before bedtime. E11.8   rosuvastatin 40 MG tablet Commonly known as: CRESTOR Take 1 tablet (40 mg total) by mouth daily. To lower cholesterol   sildenafil 50 MG tablet Commonly known as: VIAGRA TAKE 2 TABLETS BY MOUTH DAILY AS NEEDED FOR ERECTILE DYSFUNCTION What changed:   how much to take  how to take this  when to take this  [name] to take this  additional instructions   silver sulfADIAZINE 1 % cream Commonly known as: SILVADENE Apply to affected toes once daily and cover with dressing. What changed:   how much to take  how to take this  when to take this  [name] to take this  additional instructions          Outstanding Labs/Studies   Lower extremity dopplers 11/28/19  Duration of Discharge Encounter   Greater than 30 minutes including physician time.  Signed, Zyara Riling Ninfa Meeker, PA-C 11/19/2019, 11:03 AM

## 2019-11-19 NOTE — Progress Notes (Signed)
Progress Note  Patient Name: Caleb [name] Date of Encounter: 11/19/2019  [facility] Cardiologist: Quay Burow, MD   Subjective   Feeling well.  Leg is much better.  He was able to ambulate without pain.  Inpatient Medications    Scheduled Meds: . aspirin EC  81 mg Oral Daily  . clopidogrel  75 mg Oral Daily  . doxycycline  100 mg Oral BID  . lisinopril  40 mg Oral BID   And  . hydrochlorothiazide  25 mg Oral BID  . insulin aspart  0-15 Units Subcutaneous TID WC  . metoprolol tartrate  100 mg Oral BID  . rosuvastatin  40 mg Oral Daily  . sodium chloride flush  3 mL Intravenous Q12H   Continuous Infusions: . sodium chloride     PRN Meds: sodium chloride, acetaminophen, hydrALAZINE, labetalol, Muscle Rub, ondansetron (ZOFRAN) IV, sodium chloride flush   Vital Signs    Vitals:   11/18/19 1431 11/18/19 2137 11/19/19 0500 11/19/19 0811  BP: (!) 144/90 (!) 154/92 (!) 132/92 122/90  Pulse: 74  82 89  Resp: 18 18  16   Temp: 98.4 F (36.9 C) 98.5 F (36.9 C) 98.5 F (36.9 C)   TempSrc: Oral Oral Oral   SpO2: 99% 98% 100% 99%  Weight:   84.2 kg   Height:        Intake/Output Summary (Last 24 hours) at 11/19/2019 0924 Last data filed at 11/19/2019 0800 Gross per 24 hour  Intake 222 ml  Output 1700 ml  Net -1478 ml   Last 3 Weights 11/19/2019 11/18/2019 11/15/2019  Weight (lbs) 185 lb 10 oz 189 lb 189 lb  Weight (kg) 84.2 kg 85.73 kg 85.73 kg      Telemetry    Sinus rhythm.  No events. - Personally Reviewed  ECG     n/a- Personally Reviewed  Physical Exam   VS:  BP 122/90 (BP Location: Right Arm)   Pulse 89   Temp 98.5 F (36.9 C) (Oral)   Resp 16   Ht 5\' 8"  (1.727 m)   Wt 84.2 kg   SpO2 99%   BMI 28.22 kg/m  , BMI Body mass index is 28.22 kg/m. GENERAL:  Well appearing HEENT: Pupils equal round and reactive, fundi not visualized, oral mucosa unremarkable NECK:  No jugular venous distention, waveform within normal limits, carotid upstroke  brisk and symmetric, no bruits LUNGS:  Mild crackles at R base HEART:  RRR.  PMI not displaced or sustained,S1 and S2 within normal limits, no S3, no S4, no clicks, no rubs, II/VI systolic murmur at the LUSB ABD:  Flat, positive bowel sounds normal in frequency in pitch, no bruits, no rebound, no guarding, no midline pulsatile mass, no hepatomegaly, no splenomegaly EXT:  2 plus pulses throughout, no edema, no cyanosis no clubbing.  Mild ecchymosis at R groin.  No hematoma or bruit SKIN:  No rashes no nodules NEURO:  Cranial nerves II through XII grossly intact, motor grossly intact throughout PSYCH:  Cognitively intact, oriented to person place and time  Labs    High Sensitivity Troponin:  No results for input(s): TROPONINIHS in the last 720 hours.    Chemistry Recent Labs  Lab 11/15/19 1139 11/19/19 0326  NA 138 136  K 5.2 4.0  CL 100 102  CO2 25 25  GLUCOSE 263* 175*  BUN 12 14  CREATININE 1.08 1.10  CALCIUM 9.2 8.9  GFRNONAA 70 >60  GFRAA 81 >60  ANIONGAP  --  9     Hematology Recent Labs  Lab 11/15/19 1139 11/19/19 0326  WBC 6.8 6.5  RBC 4.70 4.19*  HGB 13.3 11.3*  HCT 40.2 35.8*  MCV 86 85.4  MCH 28.3 27.0  MCHC 33.1 31.6  RDW 14.5 13.8  PLT 300 296    BNPNo results for input(s): BNP, PROBNP in the last 168 hours.   DDimer No results for input(s): DDIMER in the last 168 hours.   Radiology    PERIPHERAL VASCULAR CATHETERIZATION  Result Date: 11/18/2019  [id number] LOCATION:  FACILITY: [facility] PHYSICIAN: Quay Burow, M.D. Oct 13, 1950 DATE OF PROCEDURE:  11/18/2019 DATE OF DISCHARGE: PV Angiogram/Intervention History obtained from chart review.Caleb [name] is a 69 y.o.  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 Adah Perl, his podiatrist for evaluation of critical limb ischemia.I last saw him in the office 09/06/2019.His wife of  82 years did die of ovarian cancer 3 years ago but he is very positive about meeting somebody new in his life. He is very active and swims 4 days a week, walks 3 to 4 days a week and belongs to fitness. He denies chest pain or shortness of breath. His cardiac risk factors are notable for treated hypertension, diabetes and hyperlipidemia. His father did die of a myocardial infarction age 64. He is never had a heart attack or stroke. He does complain of lifestyle and claudication however. He developed wounds on his toes after walking 5 miles about [facility] with new shoes and has been treated by Dr. Adah Perl since that time with slow progression of wound healing.  I performed peripheral angiography on him 08/08/2019 and performed atherectomy and drug-coated balloon angioplasty of high-grade right SFA and popliteal stenosis with two-vessel runoff. This resulted in an improvement in his claudication symptoms, healing of his right foot wounds and improvement in his Doppler studies. He did have an occluded left popliteal artery which I will address in a staged fashion in the near future although his wounds on his left foot are slowly healing. I also performed carotid Doppler studies that showed high-grade right greater than left ICA stenosis and have referred him to vascular surgery for consideration of endarterectomy. He denies chest pain or shortness of breath. He does have mild aortic stenosis by 2D echo 2 years ago and a high-pitched outflow tract murmur. We check a 2D echo on 08/27/2019 that showed a decline in his EF from 50 to 55% down to 40 to 45% with moderate to severe aortic stenosis.  A Myoview stress test performed 08/30/2019 showed inferior scar without ischemia.  He really denies chest pain or shortness of breath.  He is referred back by Dr. Oneida Alar because of progressive gangrene in his left fourth toe.  His angiogram which I performed 08/08/2019 did show an occluded left popliteal artery with  occluded posterior tibial and peroneal and high-grade AT disease.  I am going to attempt endovascular therapy on Monday for limb salvage. Pre Procedure Diagnosis: Peripheral arterial disease Post Procedure Diagnosis: Peripheral arterial disease Operators: Dr. Quay Burow Procedures Performed:  1.  Ultrasound-guided right common femoral access  2.  Contralateral access (secondary catheter placement)  3.  PTA and stenting of a left popliteal CTO in the P1 and P2 segments with a Tigris self-expanding stent  4.  Angiogram right common femoral artery followed by Perclose closure PROCEDURE DESCRIPTION: The patient was brought to the  second floor  Cardiac cath lab in the the postabsorptive state. He was premedicated with IV Versed and fentanyl. His right groin was prepped and shaved in usual sterile fashion. Xylocaine 1% was used for local anesthesia. A 5 French sheath was inserted into the right common femoral artery using standard Seldinger technique.  Ultrasound was used to identify the right common femoral artery and guide access.  A digital image was captured and placed the patient's chart.  Contralateral access was obtained with a crossover catheter, Glidewire, Rosen wire and a 7 French 55 cm multipurpose Ansell sheath which was placed in the left SFA.  Omnipaque dye was used for the entirety of the intervention.  Retroaortic pressures monitored in the case.  The patient was already on aspirin Plavix. I was able to cross the CTO with a 035 quick cross catheter and an 018V 18 CTO wire.  This did result in a microperforation with extravasation that was sealed with a 4 mm x 100 mm long J balloon with low pressure inflation for 10 minutes.  This completely sealed the perforation and there was no extravasation.  I then stented the CTO segment with a 5 mm x 60 mm long Tigris self-expanding stent and postdilated with a 4 mm x 40 mm long noncompliant balloon resulting in reduction of a total occlusion of the P1  and P2 segment to 0% residual with excellent flow down the anterior tibial artery.  There was a palpable pedal pulse at the end of the case.  The patient did receive a total of 15,000 units of heparin with an ACT of 263.  Total contrast administered the patient was 150 cc. The right common femoral angiogram was performed and a Perclose device was successfully deployed achieving hemostasis.   Successful PTA and stenting of a left SFA CTO for critical limb ischemia.  There was a small perforation that was sealed with prolonged low-pressure balloon inflation and stented with a Tigris self-expanding stent.  Patient was already on aspirin Plavix.  He will be gently hydrated overnight, discharged home the morning.  Will get lower extremity arterial Doppler studies in our [employer] office next week and I will see him back the week after. Quay Burow. MD, [facility] 11/18/2019 9:54 AM    Cardiac Studies   11/18/19: Abdominal aortogram Peripheral vascular intervention IMPRESSION: Successful PTA and stenting of a left SFA CTO for critical limb ischemia.  There was a small perforation that was sealed with prolonged low-pressure balloon inflation and stented with a Tigris self-expanding stent.  Patient was already on aspirin Plavix.  He will be gently hydrated overnight, discharged home the morning.  Will get lower extremity arterial Doppler studies in our [employer] office next week and I will see him back the week after.   Patient Profile     69 y.o. male with diabetes, hypertension, hyperlipidemia,   Assessment & Plan    # CLI: Mr. Abraha underwent angioplasty of the L SFA which was completely occluded.  A Tigris stent was placed.  He is feeling much better.  Continue aspirin and clopidogrel.  He will have repeat Dopplers next week and see Dr. Gwenlyn Found the following week.  # Anemia:  This am hgb is 11.3 down from 13.3 four days ago. Will repeat stat h/h and if stable he can be discharged.  # Essential  hypertension:  BP well-controlled on lisionpril/HCTZ, and metoprolol.  # Hyperlipidemia:  Continue rosuvastatin.   # DM: A1c is 7.6%.  We will  add Jardiance 10mg  daily.  # Aortic stenosis: Mild by mean gradient but moderate to severe thickening and calcification.  He is asymptomatic.  Continue to monitor.  LVEF was 40-45%.  Cannot rule out low flow-low, low gradient.  # Chronic systolic and diastolic heart failure: LVEF 40-45%.  He is euvolemic and able to exercise regularly.  Adding Jardiance as above. Continue metoprolol and lisinopril. Consider switching metoprolol to succinate.    For questions or updates, please contact [place] Please consult [email or web address] for contact info under        Signed, Skeet Latch, MD  11/19/2019, 9:24 AM

## 2019-11-19 NOTE — TOC Benefit Eligibility Note (Signed)
Transition of Care [facility]) Benefit Eligibility Note    Patient Details  Name: Caleb [name] MRN: [id number] Date of Birth: [birth year]   Medication/Dose: Vania Rea 10 mg. daily  Covered?: Yes  Tier: 3 Drug  Prescription Coverage [facility]  Spoke with Person/Company/Phone Number:: [place] PH# [phone]  Co-Pay: $18.58 30 day supply  Prior Approval: No  Deductible: Met       Shelda Altes Phone Number: 11/19/2019, 12:38 PM

## 2019-11-19 NOTE — Care Management (Addendum)
1014 11-19-19 Benefits check submitted for Jardiance. Case Manager will follow for cost. Case Manager will provide the patient with the 14 day free trial and the Jardiance 3 month supply co pay card. Patient usually uses the [employer]. No further needs from Case Manager. Bethena Roys, RN Case Manager

## 2019-11-19 NOTE — Progress Notes (Signed)
Subjective:  Patient ID: Caleb [name], male    DOB: 1950-10-05,  MRN: [id number]  Chief Complaint  Patient presents with  . Diabetic Ulcer    FURTHER EVALAUTION     69 y.o. male presents for wound care.  Patient is here for follow-up of left diabetic foot ulceration with fourth digit osteomyelitis.  Patient states he is scheduled for angiogram next week.  Once the angiogram is done and there is been restoration of flow we will plan on doing amputation as well.  Patient states that overall he is doing well.  The digit has been clean dry and intact.   Review of Systems: Negative except as noted in the HPI. Denies N/V/F/Ch.  Past Medical History:  Diagnosis Date  . Carotid artery occlusion   . Diabetes mellitus without complication ([place])   . Dyslipidemia 09/27/2012  . Erectile dysfunction 09/27/2012  . Heart murmur   . Hypercholesteremia   . Hyperlipidemia 08/12/2012  . Hypertension   . Hyponatremia 08/14/2012  . Neuropathy   . Pancreatitis   . Pancreatitis, acute 09/27/2012  . Vitamin D deficiency 06/23/2015   No current facility-administered medications for this visit. No current outpatient medications on file.  Facility-Administered Medications Ordered in Other Visits:  .  0.9 %  sodium chloride infusion, 250 mL, Intravenous, PRN, Lorretta Harp, MD .  acetaminophen (TYLENOL) tablet 650 mg, 650 mg, Oral, Q4H PRN, Lorretta Harp, MD .  aspirin EC tablet 81 mg, 81 mg, Oral, Daily, Lorretta Harp, MD .  clopidogrel (PLAVIX) tablet 75 mg, 75 mg, Oral, Daily, Lorretta Harp, MD .  doxycycline (VIBRA-TABS) tablet 100 mg, 100 mg, Oral, BID, Lorretta Harp, MD, 100 mg at 11/18/19 2134 .  hydrALAZINE (APRESOLINE) injection 5 mg, 5 mg, Intravenous, Q20 Min PRN, Lorretta Harp, MD .  lisinopril (ZESTRIL) tablet 40 mg, 40 mg, Oral, BID, 40 mg at 11/18/19 2137 **AND** hydrochlorothiazide (HYDRODIURIL) tablet 25 mg, 25 mg, Oral, BID, Lorretta Harp, MD, 25 mg at 11/18/19  2138 .  insulin aspart (novoLOG) injection 0-15 Units, 0-15 Units, Subcutaneous, TID WC, Bhagat, Bhavinkumar, PA, 3 Units at 11/18/19 1720 .  labetalol (NORMODYNE) injection 10 mg, 10 mg, Intravenous, Q10 min PRN, Lorretta Harp, MD, 10 mg at 11/18/19 1028 .  metoprolol tartrate (LOPRESSOR) tablet 100 mg, 100 mg, Oral, BID, Lorretta Harp, MD, 100 mg at 11/18/19 2138 .  Muscle Rub CREA, , Topical, PRN, Fay Records, MD .  ondansetron [facility]) injection 4 mg, 4 mg, Intravenous, Q6H PRN, Lorretta Harp, MD .  rosuvastatin (CRESTOR) tablet 40 mg, 40 mg, Oral, Daily, Lorretta Harp, MD .  sodium chloride flush (NS) 0.9 % injection 3 mL, 3 mL, Intravenous, Q12H, Lorretta Harp, MD, 3 mL at 11/18/19 2134 .  sodium chloride flush (NS) 0.9 % injection 3 mL, 3 mL, Intravenous, PRN, Lorretta Harp, MD  Social History   Tobacco Use  Smoking Status Former Smoker  . Types: Cigarettes  Smokeless Tobacco Never Used    No Known Allergies Objective:  There were no vitals filed for this visit. There is no height or weight on file to calculate BMI. Constitutional Well developed. Well nourished.  Vascular Dorsalis pedis pulses non palpable bilaterally. Posterior tibial pulses non palpable bilaterally. Capillary refill normal to all digits.  No cyanosis or clubbing noted. Pedal hair growth normal.  Neurologic Normal speech. Oriented to person, place, and time. Protective sensation absent  Dermatologic Wound Location: Left  fourth digit ulceration probing down to bone with underlying osteomyelitis.  No purulent drainage noted no cellulitis or redness noted.  No malodor present. Wound Base: Mixed Granular/Fibrotic Peri-wound: Calloused Exudate: None: wound tissue dry Wound Measurements: -See below  Orthopedic: No pain to palpation either foot.   Radiographs: 3 views of skeletally mature adult fourth digit prior x-rays were reviewed by me.  Cortical destruction noted of the left  fourth digit consistent with osteomyelitis.  No osteomyelitis noted at the other digits.  We will plan on getting serial x-ray to continue to monitor osteomyelitis. Assessment:   1. Osteomyelitis of fourth toe of left foot ([place])   2. Diabetic ulcer of toe associated with type 2 diabetes mellitus, with fat layer exposed, unspecified laterality ([place])    Plan:  Patient was evaluated and treated and all questions answered.  Ulcer left fourth digit with underlying osteomyelitis -Dressed with Betadine wet-to-dry, DSD. -Continue off-loading with surgical shoe. -Patient is a high risk of losing the fourth digit however in the setting of poor vascular flow he is a high risk of losing much further than just the digit.  For now patient only has osteomyelitis without any other clinical signs of infection such as purulent drainage or cellulitis/redness.  I will continue to monitor the progression of osteomyelitis until his vascular flow has improved.  If this becomes worsening infection then I will plan on doing an amputation prior to vascular intervention.  However at this time I would like the patient to be stabilized from his carotid artery as well as his left vascular flow. -Patient is scheduled to undergo angiogram on 11/28/2019.  Once angiogram is performed I will schedule my amputation as well. -Doxycycline was dispensed I will keep him on it indefinitely until surgical amputation. -Continue performing local wound care with Betadine wet-to-dry dressings. -Wound cultures were reviewed by me however they were taken in clinic and a nonsterile way therefore I I will not place a lot of emphasis on the culture. -Plan on getting x-ray during the clinical visit   No follow-ups on file.

## 2019-11-20 ENCOUNTER — Other Ambulatory Visit: Payer: Self-pay

## 2019-11-20 ENCOUNTER — Ambulatory Visit: Payer: Medicare HMO | Admitting: Podiatry

## 2019-11-20 ENCOUNTER — Ambulatory Visit (INDEPENDENT_AMBULATORY_CARE_PROVIDER_SITE_OTHER): Payer: Medicare HMO | Admitting: Podiatry

## 2019-11-20 ENCOUNTER — Telehealth: Payer: Self-pay

## 2019-11-20 ENCOUNTER — Ambulatory Visit (INDEPENDENT_AMBULATORY_CARE_PROVIDER_SITE_OTHER): Payer: Medicare HMO

## 2019-11-20 DIAGNOSIS — M86171 Other acute osteomyelitis, right ankle and foot: Secondary | ICD-10-CM

## 2019-11-20 DIAGNOSIS — M869 Osteomyelitis, unspecified: Secondary | ICD-10-CM | POA: Diagnosis not present

## 2019-11-20 DIAGNOSIS — L97502 Non-pressure chronic ulcer of other part of unspecified foot with fat layer exposed: Secondary | ICD-10-CM

## 2019-11-20 DIAGNOSIS — E11621 Type 2 diabetes mellitus with foot ulcer: Secondary | ICD-10-CM | POA: Diagnosis not present

## 2019-11-20 NOTE — Telephone Encounter (Signed)
DOS 11/21/2019  AMPUTATION TOE MPJ JOINT 4TH, 5TH LT - 28820  The following codes do not require a pre-authorization Created on 11/20/2019 Sex M DOB 1950-05-16 Payer n/a Membership type n/a Plan Type MED Plan Year 03/22/2019 - 03/20/9998 Group ID [id number] Service info [id number] Amputation of foot

## 2019-11-21 ENCOUNTER — Encounter: Payer: Self-pay | Admitting: Vascular Surgery

## 2019-11-21 ENCOUNTER — Encounter (HOSPITAL_BASED_OUTPATIENT_CLINIC_OR_DEPARTMENT_OTHER): Payer: Medicare HMO | Admitting: Internal Medicine

## 2019-11-21 DIAGNOSIS — M86472 Chronic osteomyelitis with draining sinus, left ankle and foot: Secondary | ICD-10-CM | POA: Diagnosis not present

## 2019-11-21 DIAGNOSIS — M86672 Other chronic osteomyelitis, left ankle and foot: Secondary | ICD-10-CM | POA: Diagnosis not present

## 2019-11-21 DIAGNOSIS — I10 Essential (primary) hypertension: Secondary | ICD-10-CM | POA: Diagnosis not present

## 2019-11-21 DIAGNOSIS — M86172 Other acute osteomyelitis, left ankle and foot: Secondary | ICD-10-CM | POA: Diagnosis not present

## 2019-11-21 MED ORDER — OXYCODONE-ACETAMINOPHEN 10-325 MG PO TABS: 1.0000 | ORAL_TABLET | Freq: Four times a day (QID) | ORAL | 0 refills | Status: DC | PRN

## 2019-11-21 NOTE — H&P (View-Only) (Signed)
Patient recently underwent intervention of his popliteal artery by Dr. Gwenlyn Found.  This should improve perfusion to his left foot and hopefully promote wound healing.  At this point I think we should proceed with left carotid endarterectomy.  He will need interval right carotid endarterectomy.  Tentatively we will schedule the left carotid endarterectomy for December 16, 2019.  This will be pending patient's availability for operation.  We will contact him regarding this.  Ruta Hinds, MD [facility] Office: [phone]

## 2019-11-21 NOTE — Progress Notes (Signed)
Patient recently underwent intervention of his popliteal artery by Dr. Gwenlyn Found.  This should improve perfusion to his left foot and hopefully promote wound healing.  At this point I think we should proceed with left carotid endarterectomy.  He will need interval right carotid endarterectomy.  Tentatively we will schedule the left carotid endarterectomy for December 16, 2019.  This will be pending patient's availability for operation.  We will contact him regarding this.  Ruta Hinds, MD [facility] [place] Office: [phone]

## 2019-11-22 ENCOUNTER — Other Ambulatory Visit: Payer: Self-pay | Admitting: *Deleted

## 2019-11-22 ENCOUNTER — Other Ambulatory Visit: Payer: Self-pay | Admitting: Podiatry

## 2019-11-22 ENCOUNTER — Encounter: Payer: Self-pay | Admitting: Podiatry

## 2019-11-22 ENCOUNTER — Encounter: Payer: Self-pay | Admitting: *Deleted

## 2019-11-22 MED ORDER — ACETAMINOPHEN-CODEINE 300-30 MG PO TABS
ORAL_TABLET | ORAL | 0 refills | Status: DC
Start: 2019-11-22 — End: 2020-02-04

## 2019-11-22 NOTE — Progress Notes (Signed)
Subjective:  Patient ID: Caleb [name], male    DOB: 29-Nov-1950,  MRN: [id number]  Chief Complaint  Patient presents with  . Follow-up    PT STATEs he is doing alot better- no f/c/v/n/sob/cp- still taking antibioticc    69 y.o. male presents for wound care.  Patient is here for follow-up of left diabetic foot ulceration with fourth digit osteomyelitis.  Patient has completed angiogram he has good circulation to the left lower extremity.  Patient will work on the carotid after my surgical amputation.  He denies any other acute complaints.  He now has a new left fifth digit ulceration with probing down to bone with underlying osteomyelitis.   Review of Systems: Negative except as noted in the HPI. Denies N/V/F/Ch.  Past Medical History:  Diagnosis Date  . Carotid artery occlusion   . Diabetes mellitus without complication ([place])   . Dyslipidemia 09/27/2012  . Erectile dysfunction 09/27/2012  . Heart murmur   . Hypercholesteremia   . Hyperlipidemia 08/12/2012  . Hypertension   . Hyponatremia 08/14/2012  . Neuropathy   . Pancreatitis   . Pancreatitis, acute 09/27/2012  . Vitamin D deficiency 06/23/2015    Current Outpatient Medications:  .  aspirin EC 81 MG tablet, Take 81 mg by mouth daily., Disp: , Rfl:  .  Blood Glucose Monitoring Suppl (ONETOUCH VERIO) w/Device KIT, 1 each by Does not apply route 2 (two) times daily. Use as instructed twice daily, check fasting sugar in morning and check before bedtime. E11.8, Disp: 1 kit, Rfl: 0 .  clopidogrel (PLAVIX) 75 MG tablet, Take 1 tablet (75 mg total) by mouth daily., Disp: 30 tablet, Rfl: 11 .  collagenase (SANTYL) ointment, Apply 1 application topically daily. Apply Santyl Ointment to scab on left 3rd and 4th digits. Measurements: Left 3rd toe 1.0 x 0.6 cm Left 4th toe: 2.0 x 1.0 cm (Patient taking differently: Apply 1 application topically daily as needed (Scab). Apply Santyl Ointment to scab on left 3rd and 4th digits. Measurements: Left  3rd toe 1.0 x 0.6 cm Left 4th toe: 2.0 x 1.0 cm), Disp: 30 g, Rfl: 0 .  Continuous Blood Gluc Receiver (FREESTYLE LIBRE 14 DAY READER) DEVI, 1 each by Does not apply route See admin instructions. Use as directed to check blood glucose, Disp: 1 each, Rfl: 0 .  Continuous Blood Gluc Sensor (FREESTYLE LIBRE 14 DAY SENSOR) MISC, INJECT IN THE SKIN EVERY 14 (FOURTEEN) DAYS. E11.69 Z79.4, Disp: 6 each, Rfl: 3 .  doxycycline (VIBRA-TABS) 100 MG tablet, Take 1 tablet (100 mg total) by mouth 2 (two) times daily. (Patient not taking: Reported on 11/15/2019), Disp: 60 tablet, Rfl: 0 .  doxycycline (VIBRA-TABS) 100 MG tablet, Take 1 tablet (100 mg total) by mouth 2 (two) times daily., Disp: 60 tablet, Rfl: 0 .  gabapentin (NEURONTIN) 300 MG capsule, Take 1 capsule (300 mg total) by mouth 2 (two) times daily. (Patient taking differently: Take 300-600 mg by mouth daily as needed (pain). ), Disp: 180 capsule, Rfl: 1 .  glucose blood test strip, Use as instructed twice daily, check fasting sugar in morning and check before bedtime. E11.8, Disp: 100 each, Rfl: 12 .  HUMULIN 70/30 (70-30) 100 UNIT/ML injection, INJECT 37 UNITS INTO THE SKIN 2 TIMES DAILY WITH A MEAL. (Patient taking differently: Inject 37 Units into the skin 2 (two) times daily as needed (high blood sugar (typically once daily)). Under 110 do not take), Disp: 60 mL, Rfl: 6 .  lisinopril-hydrochlorothiazide (ZESTORETIC) 20-12.5 MG  tablet, Take 2 tablets by mouth daily., Disp: 180 tablet, Rfl: 1 .  metFORMIN (GLUCOPHAGE) 1000 MG tablet, Take 1 tablet (1,000 mg total) by mouth 2 (two) times daily with a meal. (Patient taking differently: Take 1,000 mg by mouth in the morning and at bedtime. ), Disp: 360 tablet, Rfl: 1 .  metoprolol tartrate (LOPRESSOR) 100 MG tablet, Take 1 tablet (100 mg total) by mouth 2 (two) times daily., Disp: 180 tablet, Rfl: 1 .  ONETOUCH DELICA LANCETS 30N MISC, Use as instructed twice daily, check fasting sugar in morning and check  before bedtime. E11.8, Disp: 100 each, Rfl: 12 .  oxyCODONE-acetaminophen (PERCOCET) 10-325 MG tablet, Take 1 tablet by mouth every 6 (six) hours as needed for up to 8 days for pain., Disp: 30 tablet, Rfl: 0 .  rosuvastatin (CRESTOR) 40 MG tablet, Take 1 tablet (40 mg total) by mouth daily. To lower cholesterol, Disp: 90 tablet, Rfl: 1 .  sildenafil (VIAGRA) 50 MG tablet, TAKE 2 TABLETS BY MOUTH DAILY AS NEEDED FOR ERECTILE DYSFUNCTION (Patient taking differently: Take 50-100 mg by mouth as needed for erectile dysfunction. ), Disp: 20 tablet, Rfl: 1 .  silver sulfADIAZINE (SILVADENE) 1 % cream, Apply to affected toes once daily and cover with dressing. (Patient taking differently: Apply 1 application topically daily as needed (wound care). ), Disp: 400 g, Rfl: 1  Current Facility-Administered Medications:  .  sodium chloride flush (NS) 0.9 % injection 3 mL, 3 mL, Intravenous, Q12H, Lorretta Harp, MD .  sodium chloride flush (NS) 0.9 % injection 3 mL, 3 mL, Intravenous, Q12H, Gwenlyn Found Pearletha Forge, MD  Social History   Tobacco Use  Smoking Status Former Smoker  . Types: Cigarettes  Smokeless Tobacco Never Used    No Known Allergies Objective:  There were no vitals filed for this visit. There is no height or weight on file to calculate BMI. Constitutional Well developed. Well nourished.  Vascular Dorsalis pedis pulses non palpable bilaterally. Posterior tibial pulses non palpable bilaterally. Capillary refill normal to all digits.  No cyanosis or clubbing noted. Pedal hair growth normal.  Neurologic Normal speech. Oriented to person, place, and time. Protective sensation absent  Dermatologic Wound Location: Left fourth digit ulceration probing down to bone with underlying osteomyelitis.  No purulent drainage noted no cellulitis or redness noted.  No malodor present.  New left fifth digit ulceration with underlying osteomyelitis noted as well.  Probes down to bone.  No clinical signs of  infection. Wound Base: Mixed Granular/Fibrotic Peri-wound: Calloused Exudate: None: wound tissue dry Wound Measurements: -See below  Orthopedic: No pain to palpation either foot.   Radiographs: 3 views of skeletally mature adult fourth digit prior x-rays were reviewed by me.  Cortical destruction noted of the left fourth digit consistent with osteomyelitis.  No osteomyelitis noted at the other digits.  We will plan on getting serial x-ray to continue to monitor osteomyelitis. Assessment:   1. Osteomyelitis of fourth toe of left foot ([place])   2. Diabetic ulcer of toe associated with type 2 diabetes mellitus, with fat layer exposed, unspecified laterality ([place])   3. Osteomyelitis of fifth toe of left foot ([place])    Plan:  Patient was evaluated and treated and all questions answered.  Ulcer left fourth digit with underlying osteomyelitis and a new onset of fifth digit osteomyelitis as well -Dressed with Betadine wet-to-dry, DSD. -Continue off-loading with surgical shoe. -Patient underwent angiogram yesterday which showed after intervention to have good circulation to the left lower  extremity.  Given that the wounds are regressing with now involvement of the fifth digit patient will benefit from amputation of fourth and fifth digit.  I discussed this with patient in extensive detail we will plan to take him to the operating room tomorrow for surgical amputation. -He will be on doxycycline for 2 weeks after the surgery. -I discussed with the patient that he is still a high risk for not healing after amputation.  He is still high risk of losing foot as well as leg.  Patient states understanding would like to proceed despite all the risks associated with it.  I plan on doing fourth and fifth digit amputation with disarticulation at the MPJ joint.  Patient states understanding. -Informed surgical risk consent was reviewed and read aloud to the patient.  I reviewed the films.  I have discussed my  findings with the patient in great detail.  I have discussed all risks including but not limited to infection, stiffness, scarring, limp, disability, deformity, damage to blood vessels and nerves, numbness, poor healing, need for braces, arthritis, chronic pain, amputation, death.  All benefits and realistic expectations discussed in great detail.  I have made no promises as to the outcome.  I have provided realistic expectations.  I have offered the patient a 2nd opinion, which they have declined and assured me they preferred to proceed despite the risks -A total of 33 minutes was spent in direct patient care as well as pre and post patient encounter activities.  This includes documentation as well as reviewing patient chart for labs, imaging, past medical, surgical, social, and family history as documented in the EMR.  I have reviewed medication allergies as documented in EMR.  I discussed the etiology of condition and treatment options from conservative to surgical care.  All risks and benefit of the treatment course was discussed in detail.  All questions were answered and return appointment was discussed.  Since the visit completed in an ambulatory/outpatient setting, the patient and/or parent/guardian has been advised to contact the providers office for worsening condition and seek medical treatment and/or call 911 if the patient deems either is necessary.   No follow-ups on file.

## 2019-11-23 LAB — WOUND CULTURE
MICRO NUMBER:: 10899431
SPECIMEN QUALITY:: ADEQUATE

## 2019-11-24 ENCOUNTER — Other Ambulatory Visit: Payer: Self-pay | Admitting: Podiatry

## 2019-11-24 MED ORDER — TRAMADOL HCL 50 MG PO TABS: 50.0000 mg | ORAL_TABLET | ORAL | 0 refills | Status: AC | PRN

## 2019-11-24 MED ORDER — ONDANSETRON HCL 4 MG PO TABS: 4.0000 mg | ORAL_TABLET | Freq: Three times a day (TID) | ORAL | 0 refills | Status: DC | PRN

## 2019-11-24 NOTE — Progress Notes (Signed)
PRN postop 

## 2019-11-26 ENCOUNTER — Telehealth: Payer: Self-pay

## 2019-11-26 NOTE — Telephone Encounter (Signed)
-----   Message from Elam Dutch, MD sent at 11/22/2019  4:39 PM EDT ----- Regarding: RE: Surgery Pre-cert Not doing stent doing traditional CEA. Stroke risk 5% per year ----- Message ----- From: Kaleen Mask, LPN Sent: 03/26/1094  04:54 AM EDT To: Elam Dutch, MD Subject: Surgery Pre-cert                               Good morning Dr. Oneida Alar.  I am precerting this patient's surgery and need to know what his stroke risk percentage is please. (Less than or greater than 3%?) Is Anatomy suitable for stenting? Please advise.  Thanks,  Savahanna Almendariz.

## 2019-11-28 ENCOUNTER — Other Ambulatory Visit (HOSPITAL_COMMUNITY): Payer: Self-pay | Admitting: Cardiovascular Disease

## 2019-11-28 ENCOUNTER — Ambulatory Visit (HOSPITAL_COMMUNITY)
Admission: RE | Admit: 2019-11-28 | Discharge: 2019-11-28 | Disposition: A | Discharge status: Home / Self Care | Payer: Medicare HMO | Source: Ambulatory Visit | Attending: Cardiovascular Disease | Admitting: Cardiovascular Disease

## 2019-11-28 ENCOUNTER — Other Ambulatory Visit: Payer: Self-pay

## 2019-11-28 DIAGNOSIS — I739 Peripheral vascular disease, unspecified: Secondary | ICD-10-CM

## 2019-11-29 ENCOUNTER — Ambulatory Visit (INDEPENDENT_AMBULATORY_CARE_PROVIDER_SITE_OTHER): Payer: Medicare HMO | Admitting: Podiatry

## 2019-11-29 ENCOUNTER — Ambulatory Visit (INDEPENDENT_AMBULATORY_CARE_PROVIDER_SITE_OTHER): Payer: Medicare HMO

## 2019-11-29 ENCOUNTER — Ambulatory Visit: Payer: Medicare HMO | Admitting: Podiatry

## 2019-11-29 DIAGNOSIS — M869 Osteomyelitis, unspecified: Secondary | ICD-10-CM

## 2019-11-29 DIAGNOSIS — M79672 Pain in left foot: Secondary | ICD-10-CM | POA: Diagnosis not present

## 2019-11-29 DIAGNOSIS — M722 Plantar fascial fibromatosis: Secondary | ICD-10-CM | POA: Diagnosis not present

## 2019-12-02 ENCOUNTER — Other Ambulatory Visit: Payer: Self-pay

## 2019-12-02 ENCOUNTER — Ambulatory Visit (INDEPENDENT_AMBULATORY_CARE_PROVIDER_SITE_OTHER): Payer: Medicare HMO | Admitting: Cardiovascular Disease

## 2019-12-02 ENCOUNTER — Encounter: Payer: Self-pay | Admitting: Cardiovascular Disease

## 2019-12-02 ENCOUNTER — Encounter: Payer: Self-pay | Admitting: Podiatry

## 2019-12-02 VITALS — BP 98/62 | HR 90 | Ht 68.0 in | Wt 177.0 lb

## 2019-12-02 DIAGNOSIS — Z959 Presence of cardiac and vascular implant and graft, unspecified: Secondary | ICD-10-CM

## 2019-12-02 DIAGNOSIS — I350 Nonrheumatic aortic (valve) stenosis: Secondary | ICD-10-CM | POA: Diagnosis not present

## 2019-12-02 DIAGNOSIS — I998 Other disorder of circulatory system: Secondary | ICD-10-CM | POA: Diagnosis not present

## 2019-12-02 DIAGNOSIS — I6523 Occlusion and stenosis of bilateral carotid arteries: Secondary | ICD-10-CM

## 2019-12-02 DIAGNOSIS — I10 Essential (primary) hypertension: Secondary | ICD-10-CM | POA: Diagnosis not present

## 2019-12-02 DIAGNOSIS — E782 Mixed hyperlipidemia: Secondary | ICD-10-CM

## 2019-12-02 DIAGNOSIS — I739 Peripheral vascular disease, unspecified: Secondary | ICD-10-CM

## 2019-12-02 DIAGNOSIS — Z9889 Other specified postprocedural states: Secondary | ICD-10-CM

## 2019-12-02 NOTE — Assessment & Plan Note (Signed)
History of critical limb ischemia status post right SFA and popliteal intervention by myself 08/08/2019 with ultimate healing of his right foot wound and staged left popliteal CTO intervention 11/18/2019 with subsequent left fourth and fifth toe amputation by Dr. Posey Pronto 11/22/2019.Marland Kitchen  His wounds are healing and his claudication is resolved, his Dopplers have normalized.  We will recheck lower extremity arterial Doppler studies in 6 months.

## 2019-12-02 NOTE — Assessment & Plan Note (Signed)
History of hyperlipidemia on statin therapy with lipid profile performed 01/15/2019 with a total cholesterol 129, LDL 61 and HDL 47.

## 2019-12-02 NOTE — Patient Instructions (Addendum)
Medication Instructions:  Your physician recommends that you continue on your current medications as directed. Please refer to the Current Medication list given to you today.  *If you need a refill on your cardiac medications before your next appointment, please call your pharmacy*  Lab Work: NONE ordered at this time of appointment   If you have labs (blood work) drawn today and your tests are completely normal, you will receive your results only by: Marland Kitchen MyChart Message (if you have MyChart) OR . A paper copy in the mail If you have any lab test that is abnormal or we need to change your treatment, we will call you to review the results.  Testing/Procedures: Your physician has requested that you have an echocardiogram. Echocardiography is a painless test that uses sound waves to create images of your heart. It provides your doctor with information about the size and shape of your heart and how well your heart's chambers and valves are working. This procedure takes approximately one hour. There are no restrictions for this procedure.   PLEASE SCHEDULE FOR June 2022  Your physician has requested that you have a lower extremity arterial exercise duplex. During this test, exercise and ultrasound are used to evaluate arterial blood flow in the legs. Allow one hour for this exam. There are no restrictions or special instructions.   PLEASE SCHEDULE FOR 6 MONTHS   Follow-Up: At [facility], you and your health needs are our priority.  As part of our continuing mission to provide you with exceptional heart care, we have created designated Provider Care Teams.  These Care Teams include your primary Cardiologist (physician) and Advanced Practice Providers (APPs -  Physician Assistants and Nurse Practitioners) who all work together to provide you with the care you need, when you need it.  Your next appointment:   3 month(s)  The format for your next appointment:   In Person  Provider:    Quay Burow, MD  Other Instructions

## 2019-12-02 NOTE — Progress Notes (Signed)
12/02/2019 Caleb [name]   03-24-1950  [id number]  Primary Physician Caleb Rakes, MD Primary Cardiologist: Caleb Harp MD Caleb [name], [place], [place]  HPI:  Caleb [name] is a 69 y.o.  mild to moderately overweight widowed African-American male father of 3 daughters, grandfather of 3 grandchildren who works with special needs families and was referred by Caleb [name], his podiatrist for evaluation of critical limb ischemia.I last saw him in the office  11/15/2019.His wife of 55 years did die of ovarian cancer 3 years ago but he is very positive about meeting somebody new in his life. He is very active and swims 4 days a week, walks 3 to 4 days a week and belongs to fitness. He denies chest pain or shortness of breath. His cardiac risk factors are notable for treated hypertension, diabetes and hyperlipidemia. His father did die of a myocardial infarction age 19. He is never had a heart attack or stroke. He does complain of lifestyle and claudication however. He developed wounds on his toes after walking 5 miles about [facility] with new shoes and has been treated by Caleb [name] since that time with slow progression of wound healing.  I performed peripheral angiography on him 08/08/2019 and performed atherectomy and drug-coated balloon angioplasty of high-grade right SFA and popliteal stenosis with two-vessel runoff. This resulted in an improvement in his claudication symptoms, healing of his right foot wounds and improvement in his Doppler studies. He did have an occluded left popliteal artery which I will address in a staged fashion in the near future although his wounds on his left foot are slowly healing. I also performed carotid Doppler studies that showed high-grade right greater than left ICA stenosis and have referred him to vascular surgery for consideration of endarterectomy. He denies chest pain or shortness of breath. He does have mild aortic stenosis by 2D echo 2  years ago and a high-pitched outflow tract murmur. We check a 2D echo on 08/27/2019 that showed a decline in his EF from 50 to 55% down to 40 to 45% with moderate to severe aortic stenosis.  A Myoview stress test performed 08/30/2019 showed inferior scar without ischemia.  He really denies chest pain or shortness of breath.  He is referred back by Caleb [name] because of progressive gangrene in his left fourth toe.  His angiogram which I performed 08/08/2019 did show an occluded left popliteal artery with occluded posterior tibial and peroneal and high-grade AT disease.  Because of critical limb ischemia we decided to proceed with early intervention of his left popliteal CTO.  I performed left popliteal CTO intervention on 11/18/2019 placing a 5 mm x 60 mm long [place] self-expanding stent.  He had one-vessel runoff via a large anterior tibial artery.  He was discharged home the following day.  His follow-up Dopplers performed 11/28/2019 revealed an increase in his left ABI from 0.76 up to 1.02.  He did have his left fourth and fifth toes amputated by Caleb [name] on 11/22/2019 and these are healing nicely.  He no longer has claudication.  He is scheduled for elective left carotid endarterectomy by Caleb [name] 12/16/2019.  He had a negative Myoview stress test this past June.  Current Meds  Medication Sig  . Acetaminophen-Codeine (TYLENOL/CODEINE #3) 300-30 MG tablet 1 tablet q4-6h prn postoperatve pain.  Marland Kitchen aspirin EC 81 MG tablet Take 81 mg by mouth daily.  . Blood Glucose Monitoring Suppl (ONETOUCH VERIO) w/Device KIT 1 each by Does  not apply route 2 (two) times daily. Use as instructed twice daily, check fasting sugar in morning and check before bedtime. E11.8  . clopidogrel (PLAVIX) 75 MG tablet Take 1 tablet (75 mg total) by mouth daily.  . collagenase (SANTYL) ointment Apply 1 application topically daily. Apply Santyl Ointment to scab on left 3rd and 4th digits. Measurements: Left 3rd toe 1.0 x 0.6 cm Left 4th  toe: 2.0 x 1.0 cm (Patient taking differently: Apply 1 application topically daily as needed (Scab). Apply Santyl Ointment to scab on left 3rd and 4th digits. Measurements: Left 3rd toe 1.0 x 0.6 cm Left 4th toe: 2.0 x 1.0 cm)  . Continuous Blood Gluc Receiver (FREESTYLE LIBRE 14 DAY READER) DEVI 1 each by Does not apply route See admin instructions. Use as directed to check blood glucose  . Continuous Blood Gluc Sensor (FREESTYLE LIBRE 14 DAY SENSOR) MISC INJECT IN THE SKIN EVERY 14 (FOURTEEN) DAYS. E11.69 Z79.4  . doxycycline (VIBRA-TABS) 100 MG tablet Take 1 tablet (100 mg total) by mouth 2 (two) times daily.  Marland Kitchen doxycycline (VIBRA-TABS) 100 MG tablet Take 1 tablet (100 mg total) by mouth 2 (two) times daily.  Marland Kitchen gabapentin (NEURONTIN) 300 MG capsule Take 1 capsule (300 mg total) by mouth 2 (two) times daily. (Patient taking differently: Take 300-600 mg by mouth daily as needed (pain). )  . glucose blood test strip Use as instructed twice daily, check fasting sugar in morning and check before bedtime. E11.8  . HUMULIN 70/30 (70-30) 100 UNIT/ML injection INJECT 37 UNITS INTO THE SKIN 2 TIMES DAILY WITH A MEAL. (Patient taking differently: Inject 37 Units into the skin 2 (two) times daily as needed (high blood sugar (typically once daily)). Under 110 do not take)  . lisinopril-hydrochlorothiazide (ZESTORETIC) 20-12.5 MG tablet Take 2 tablets by mouth daily.  . metFORMIN (GLUCOPHAGE) 1000 MG tablet Take 1 tablet (1,000 mg total) by mouth 2 (two) times daily with a meal. (Patient taking differently: Take 1,000 mg by mouth in the morning and at bedtime. )  . metoprolol tartrate (LOPRESSOR) 100 MG tablet Take 1 tablet (100 mg total) by mouth 2 (two) times daily.  . ondansetron (ZOFRAN) 4 MG tablet Take 1 tablet (4 mg total) by mouth every 8 (eight) hours as needed for nausea or vomiting.  Caleb [name] DELICA LANCETS 24O MISC Use as instructed twice daily, check fasting sugar in morning and check before bedtime.  E11.8  . rosuvastatin (CRESTOR) 40 MG tablet Take 1 tablet (40 mg total) by mouth daily. To lower cholesterol  . sildenafil (VIAGRA) 50 MG tablet TAKE 2 TABLETS BY MOUTH DAILY AS NEEDED FOR ERECTILE DYSFUNCTION (Patient taking differently: Take 50-100 mg by mouth as needed for erectile dysfunction. )  . silver sulfADIAZINE (SILVADENE) 1 % cream Apply to affected toes once daily and cover with dressing. (Patient taking differently: Apply 1 application topically daily as needed (wound care). )   Current Facility-Administered Medications for the 12/02/19 encounter (Office Visit) with Caleb Harp, MD  Medication  . sodium chloride flush (NS) 0.9 % injection 3 mL  . sodium chloride flush (NS) 0.9 % injection 3 mL     Allergies  Allergen Reactions  . Percocet [Oxycodone-Acetaminophen] Nausea And Vomiting    10/325. Pt has 3 doses of this medication. N/V, temperature 97, no chills and slight sweating. Pt dressing was C/D/I no drainage noted.     Social History   Socioeconomic History  . Marital status: Widowed    Spouse  name: Not on file  . Number of children: Not on file  . Years of education: Not on file  . Highest education level: Not on file  Occupational History  . Occupation: Caregiver    Comment: Special needs  Tobacco Use  . Smoking status: Former Smoker    Types: Cigarettes  . Smokeless tobacco: Never Used  Vaping Use  . Vaping Use: Never used  Substance and Sexual Activity  . Alcohol use: Not Currently    Comment: rare  . Drug use: No  . Sexual activity: Not Currently  Other Topics Concern  . Not on file  Social History Narrative   Widower.  3 daughters and 3 grandchildren.     Social Determinants of Health   Financial Resource Strain:   . Difficulty of Paying Living Expenses: Not on file  Food Insecurity:   . Worried About Charity fundraiser in the Last Year: Not on file  . Ran Out of Food in the Last Year: Not on file  Transportation Needs:   . Lack of  Transportation (Medical): Not on file  . Lack of Transportation (Non-Medical): Not on file  Physical Activity:   . Days of Exercise per Week: Not on file  . Minutes of Exercise per Session: Not on file  Stress:   . Feeling of Stress : Not on file  Social Connections:   . Frequency of Communication with Friends and Family: Not on file  . Frequency of Social Gatherings with Friends and Family: Not on file  . Attends Religious Services: Not on file  . Active Member of Clubs or Organizations: Not on file  . Attends Archivist Meetings: Not on file  . Marital Status: Not on file  Intimate Partner Violence:   . Fear of Current or Ex-Partner: Not on file  . Emotionally Abused: Not on file  . Physically Abused: Not on file  . Sexually Abused: Not on file     Review of Systems: General: negative for chills, fever, night sweats or weight changes.  Cardiovascular: negative for chest pain, dyspnea on exertion, edema, orthopnea, palpitations, paroxysmal nocturnal dyspnea or shortness of breath Dermatological: negative for rash Respiratory: negative for cough or wheezing Urologic: negative for hematuria Abdominal: negative for nausea, vomiting, diarrhea, bright red blood per rectum, melena, or hematemesis Neurologic: negative for visual changes, syncope, or dizziness All other systems reviewed and are otherwise negative except as noted above.    Blood pressure 98/62, pulse 90, height $RemoveBe'5\' 8"'lmyqpxWPa$  (1.727 m), weight 177 lb (80.3 kg).  General appearance: alert and no distress Neck: no adenopathy, no JVD, supple, symmetrical, trachea midline, thyroid not enlarged, symmetric, no tenderness/mass/nodules and Bilateral carotid bruits Lungs: clear to auscultation bilaterally Heart: 2/6 outflow tract murmur consistent with aortic stenosis Extremities: extremities normal, atraumatic, no cyanosis or edema Pulses: Diminished pedal pulses Skin: Skin color, texture, turgor normal. No rashes or  lesions Neurologic: Alert and oriented X 3, normal strength and tone. Normal symmetric reflexes. Normal coordination and gait  EKG sinus rhythm at 90 without ST or T wave changes.  I personally reviewed this EKG.  ASSESSMENT AND PLAN:   Hypertension History of essential hypertension blood pressure measured today 98/62.  He is on lisinopril, hydrochlorothiazide and metoprolol.  Hyperlipidemia History of hyperlipidemia on statin therapy with lipid profile performed 01/15/2019 with a total cholesterol 129, LDL 61 and HDL 47.  Critical limb ischemia with history of revascularization of same extremity History of critical limb ischemia status post  right SFA and popliteal intervention by myself 08/08/2019 with ultimate healing of his right foot wound and staged left popliteal CTO intervention 11/18/2019 with subsequent left fourth and fifth toe amputation by Caleb [name] 11/22/2019.Marland Kitchen  His wounds are healing and his claudication is resolved, his Dopplers have normalized.  We will recheck lower extremity arterial Doppler studies in 6 months.  Carotid artery disease (HCC) History of bilateral carotid artery stenosis scheduled for elective left carotid endarterectomy 12/16/2019 by Caleb [name] with anticipation of doing staged right carotid endarterectomy.  He does have a low risk Myoview performed back in June as well as moderately severe aortic stenosis however he is asymptomatic from this.  I cleared him at mildly elevated risk given his aortic stenosis and moderate LV dysfunction.  Moderate aortic stenosis Mr. Luckow has moderate aortic stenosis on 2D echo performed 08/27/2019 with an EF of 40 to 45%, peak gradient of 22 and a valve area of approxi-1 cm.  He is very active and denies chest pain or shortness of breath.  We will continue to follow this on annual basis.      Caleb Harp MD FACP,FACC,FAHA, [facility] 12/02/2019 10:34 AM

## 2019-12-02 NOTE — Assessment & Plan Note (Signed)
History of bilateral carotid artery stenosis scheduled for elective left carotid endarterectomy 12/16/2019 by Dr. Oneida Alar with anticipation of doing staged right carotid endarterectomy.  He does have a low risk Myoview performed back in June as well as moderately severe aortic stenosis however he is asymptomatic from this.  I cleared him at mildly elevated risk given his aortic stenosis and moderate LV dysfunction.

## 2019-12-02 NOTE — Assessment & Plan Note (Signed)
Mr. Corniel has moderate aortic stenosis on 2D echo performed 08/27/2019 with an EF of 40 to 45%, peak gradient of 22 and a valve area of approxi-1 cm.  He is very active and denies chest pain or shortness of breath.  We will continue to follow this on annual basis.

## 2019-12-02 NOTE — Assessment & Plan Note (Signed)
History of essential hypertension blood pressure measured today 98/62.  He is on lisinopril, hydrochlorothiazide and metoprolol.

## 2019-12-03 ENCOUNTER — Encounter: Payer: Self-pay | Admitting: Podiatry

## 2019-12-03 ENCOUNTER — Other Ambulatory Visit: Payer: Self-pay | Admitting: Podiatry

## 2019-12-03 DIAGNOSIS — M722 Plantar fascial fibromatosis: Secondary | ICD-10-CM

## 2019-12-03 NOTE — Progress Notes (Signed)
Subjective:  Patient ID: Caleb [name], male    DOB: Jul 31, 1950,  MRN: [id number]  Chief Complaint  Patient presents with  . Routine Post Op    PT stated that he is doing great and has no pain, He wants to get a handicap sticker    DOS: 11/21/2019 Procedure: Amputation metatarsophalangeal joint of the left fourth and fifth  69 y.o. male returns for post-op check.  Healing well.  He has been keeping the bandages clean dry and intact.  No acute complaints.  Review of Systems: Negative except as noted in the HPI. Denies N/V/F/Ch.  Past Medical History:  Diagnosis Date  . Carotid artery occlusion   . Diabetes mellitus without complication ([place])   . Dyslipidemia 09/27/2012  . Erectile dysfunction 09/27/2012  . Heart murmur   . Hypercholesteremia   . Hyperlipidemia 08/12/2012  . Hypertension   . Hyponatremia 08/14/2012  . Neuropathy   . Pancreatitis   . Pancreatitis, acute 09/27/2012  . Vitamin D deficiency 06/23/2015    Current Outpatient Medications:  .  Acetaminophen-Codeine (TYLENOL/CODEINE #3) 300-30 MG tablet, 1 tablet q4-6h prn postoperatve pain., Disp: 30 tablet, Rfl: 0 .  aspirin EC 81 MG tablet, Take 81 mg by mouth daily., Disp: , Rfl:  .  Blood Glucose Monitoring Suppl (ONETOUCH VERIO) w/Device KIT, 1 each by Does not apply route 2 (two) times daily. Use as instructed twice daily, check fasting sugar in morning and check before bedtime. E11.8, Disp: 1 kit, Rfl: 0 .  clopidogrel (PLAVIX) 75 MG tablet, Take 1 tablet (75 mg total) by mouth daily., Disp: 30 tablet, Rfl: 11 .  collagenase (SANTYL) ointment, Apply 1 application topically daily. Apply Santyl Ointment to scab on left 3rd and 4th digits. Measurements: Left 3rd toe 1.0 x 0.6 cm Left 4th toe: 2.0 x 1.0 cm (Patient taking differently: Apply 1 application topically daily as needed (Scab). Apply Santyl Ointment to scab on left 3rd and 4th digits. Measurements: Left 3rd toe 1.0 x 0.6 cm Left 4th toe: 2.0 x 1.0 cm), Disp: 30 g,  Rfl: 0 .  Continuous Blood Gluc Receiver (FREESTYLE LIBRE 14 DAY READER) DEVI, 1 each by Does not apply route See admin instructions. Use as directed to check blood glucose, Disp: 1 each, Rfl: 0 .  Continuous Blood Gluc Sensor (FREESTYLE LIBRE 14 DAY SENSOR) MISC, INJECT IN THE SKIN EVERY 14 (FOURTEEN) DAYS. E11.69 Z79.4, Disp: 6 each, Rfl: 3 .  doxycycline (VIBRA-TABS) 100 MG tablet, Take 1 tablet (100 mg total) by mouth 2 (two) times daily., Disp: 60 tablet, Rfl: 0 .  doxycycline (VIBRA-TABS) 100 MG tablet, Take 1 tablet (100 mg total) by mouth 2 (two) times daily., Disp: 60 tablet, Rfl: 0 .  gabapentin (NEURONTIN) 300 MG capsule, Take 1 capsule (300 mg total) by mouth 2 (two) times daily. (Patient taking differently: Take 300-600 mg by mouth daily as needed (pain). ), Disp: 180 capsule, Rfl: 1 .  glucose blood test strip, Use as instructed twice daily, check fasting sugar in morning and check before bedtime. E11.8, Disp: 100 each, Rfl: 12 .  HUMULIN 70/30 (70-30) 100 UNIT/ML injection, INJECT 37 UNITS INTO THE SKIN 2 TIMES DAILY WITH A MEAL. (Patient taking differently: Inject 37 Units into the skin 2 (two) times daily as needed (high blood sugar (typically once daily)). Under 110 do not take), Disp: 60 mL, Rfl: 6 .  lisinopril-hydrochlorothiazide (ZESTORETIC) 20-12.5 MG tablet, Take 2 tablets by mouth daily., Disp: 180 tablet, Rfl: 1 .  metFORMIN (GLUCOPHAGE) 1000 MG tablet, Take 1 tablet (1,000 mg total) by mouth 2 (two) times daily with a meal. (Patient taking differently: Take 1,000 mg by mouth in the morning and at bedtime. ), Disp: 360 tablet, Rfl: 1 .  metoprolol tartrate (LOPRESSOR) 100 MG tablet, Take 1 tablet (100 mg total) by mouth 2 (two) times daily., Disp: 180 tablet, Rfl: 1 .  ondansetron (ZOFRAN) 4 MG tablet, Take 1 tablet (4 mg total) by mouth every 8 (eight) hours as needed for nausea or vomiting., Disp: 30 tablet, Rfl: 0 .  ONETOUCH DELICA LANCETS 26Z MISC, Use as instructed twice  daily, check fasting sugar in morning and check before bedtime. E11.8, Disp: 100 each, Rfl: 12 .  rosuvastatin (CRESTOR) 40 MG tablet, Take 1 tablet (40 mg total) by mouth daily. To lower cholesterol, Disp: 90 tablet, Rfl: 1 .  sildenafil (VIAGRA) 50 MG tablet, TAKE 2 TABLETS BY MOUTH DAILY AS NEEDED FOR ERECTILE DYSFUNCTION (Patient taking differently: Take 50-100 mg by mouth as needed for erectile dysfunction. ), Disp: 20 tablet, Rfl: 1 .  silver sulfADIAZINE (SILVADENE) 1 % cream, Apply to affected toes once daily and cover with dressing. (Patient taking differently: Apply 1 application topically daily as needed (wound care). ), Disp: 400 g, Rfl: 1  Current Facility-Administered Medications:  .  sodium chloride flush (NS) 0.9 % injection 3 mL, 3 mL, Intravenous, Q12H, Lorretta Harp, MD .  sodium chloride flush (NS) 0.9 % injection 3 mL, 3 mL, Intravenous, Q12H, Gwenlyn Found Pearletha Forge, MD  Social History   Tobacco Use  Smoking Status Former Smoker  . Types: Cigarettes  Smokeless Tobacco Never Used    Allergies  Allergen Reactions  . Percocet [Oxycodone-Acetaminophen] Nausea And Vomiting    10/325. Pt has 3 doses of this medication. N/V, temperature 97, no chills and slight sweating. Pt dressing was C/D/I no drainage noted.    Objective:  There were no vitals filed for this visit. There is no height or weight on file to calculate BMI. Constitutional Well developed. Well nourished.  Vascular Foot warm and well perfused. Capillary refill normal to all digits.   Neurologic Normal speech. Oriented to person, place, and time. Epicritic sensation to light touch grossly present bilaterally.  Dermatologic Skin healing well without signs of infection. Skin edges well coapted without signs of infection.  Orthopedic: Tenderness to palpation noted about the surgical site.   Radiographs: 3 views of skeletally mature the left foot: Amputation site noted at the interval of MPJ fourth and fifth.   No concern for infection noted. Assessment:   1. Foot pain, left   2. Osteomyelitis of fourth toe of left foot ([place])   3. Osteomyelitis of fifth toe of left foot ([place])    Plan:  Patient was evaluated and treated and all questions answered.  S/p foot surgery left -Progressing as expected post-operatively. -XR: See above -WB Status: Weightbearing to the heel in cam boot -Sutures: Intact.  No signs of dehiscence noted.  No signs of complication noted. -Medications: None -Foot redressed.  No follow-ups on file.

## 2019-12-05 ENCOUNTER — Telehealth: Payer: Self-pay | Admitting: *Deleted

## 2019-12-05 NOTE — Telephone Encounter (Signed)
Confirmed times and dates of pre procedure testing and surgery with patient

## 2019-12-06 ENCOUNTER — Ambulatory Visit: Payer: Medicare HMO | Admitting: Podiatry

## 2019-12-10 ENCOUNTER — Ambulatory Visit: Payer: Medicare HMO | Admitting: Cardiovascular Disease

## 2019-12-10 NOTE — Progress Notes (Signed)
Your procedure is scheduled on Monday, September 27th.  Report to [facility] Main Entrance "A" at 5:30 A.M., and check in at the Admitting office.  Call this number if you have problems the morning of surgery:  [phone]  Call [phone] if you have any questions prior to your surgery date Monday-Friday 8am-4pm   Remember:  Do not eat or drink after midnight the night before your surgery   Take these medicines the morning of surgery with A SIP OF WATER  metoprolol tartrate (LOPRESSOR) rosuvastatin (CRESTOR)   If needed: gabapentin (NEURONTIN), ondansetron (ZOFRAN)    Follow your surgeon's instructions on when to stop Aspirin and Plavix.  If no instructions were given by your surgeon then you will need to call the office to get those instructions.    As of today, STOP taking Aleve, Naproxen, Ibuprofen, Motrin, Advil, Goody's, BC's, all herbal medications, fish oil, and all vitamins.          WHAT DO I DO ABOUT MY DIABETES MEDICATION?  ---The night before surgery--- Take 25 units of HUMULIN 70/30 (70-30)   ---The day of surgery--- Do NOT take metFORMIN (GLUCOPHAGE)        Do NOT take HUMULIN 70/30 (70-30)     If your CBG is greater than 220 mg/dL, you may take  of your sliding scale (correction) dose of insulin.  HOW TO MANAGE YOUR DIABETES BEFORE AND AFTER SURGERY  Why is it important to control my blood sugar before and after surgery? . Improving blood sugar levels before and after surgery helps healing and can limit problems. . A way of improving blood sugar control is eating a healthy diet by: o  Eating less sugar and carbohydrates o  Increasing activity/exercise o  Talking with your doctor about reaching your blood sugar goals . High blood sugars (greater than 180 mg/dL) can raise your risk of infections and slow your recovery, so you will need to focus on controlling your diabetes during the weeks before surgery. . Make sure that the doctor who takes care of your  diabetes knows about your planned surgery including the date and location.  How do I manage my blood sugar before surgery? . Check your blood sugar at least 4 times a day, starting 2 days before surgery, to make sure that the level is not too high or low. . Check your blood sugar the morning of your surgery when you wake up and every 2 hours until you get to the Short Stay unit. o If your blood sugar is less than 70 mg/dL, you will need to treat for low blood sugar: - Do not take insulin. - Treat a low blood sugar (less than 70 mg/dL) with  cup of clear juice (cranberry or apple), 4 glucose tablets, OR glucose gel. - Recheck blood sugar in 15 minutes after treatment (to make sure it is greater than 70 mg/dL). If your blood sugar is not greater than 70 mg/dL on recheck, call [phone] for further instructions. . Report your blood sugar to the short stay nurse when you get to Short Stay.  . If you are admitted to the hospital after surgery: o Your blood sugar will be checked by the staff and you will probably be given insulin after surgery (instead of oral diabetes medicines) to make sure you have good blood sugar levels. o The goal for blood sugar control after surgery is 80-180 mg/dL.             Do not  wear jewelry.            Do not wear lotions, powders, colognes, or deodorant.            Men may shave face and neck.            Do not bring valuables to the hospital.            [facility] is not responsible for any belongings or valuables.  Do NOT Smoke (Tobacco/Vaping) or drink Alcohol 24 hours prior to your procedure If you use a CPAP at night, you may bring all equipment for your overnight stay.   Contacts, glasses, dentures or bridgework may not be worn into surgery.      For patients admitted to the hospital, discharge time will be determined by your treatment team.   Patients discharged the day of surgery will not be allowed to drive home, and someone needs to stay with them  for 24 hours.  Special instructions:   [facility]- Preparing For Surgery  Before surgery, you can play an important role. Because skin is not sterile, your skin needs to be as free of germs as possible. You can reduce the number of germs on your skin by washing with CHG (chlorahexidine gluconate) Soap before surgery.  CHG is an antiseptic cleaner which kills germs and bonds with the skin to continue killing germs even after washing.    Oral Hygiene is also important to reduce your risk of infection.  Remember - BRUSH YOUR TEETH THE MORNING OF SURGERY WITH YOUR REGULAR TOOTHPASTE  Please do not use if you have an allergy to CHG or antibacterial soaps. If your skin becomes reddened/irritated stop using the CHG.  Do not shave (including legs and underarms) for at least 48 hours prior to first CHG shower. It is OK to shave your face.  Please follow these instructions carefully.   1. Shower the NIGHT BEFORE SURGERY and the MORNING OF SURGERY with CHG Soap.   2. If you chose to wash your hair, wash your hair first as usual with your normal shampoo.  3. After you shampoo, rinse your hair and body thoroughly to remove the shampoo.  4. Use CHG as you would any other liquid soap. You can apply CHG directly to the skin and wash gently with a scrungie or a clean washcloth.   5. Apply the CHG Soap to your body ONLY FROM THE NECK DOWN.  Do not use on open wounds or open sores. Avoid contact with your eyes, ears, mouth and genitals (private parts). Wash Face and genitals (private parts)  with your normal soap.   6. Wash thoroughly, paying special attention to the area where your surgery will be performed.  7. Thoroughly rinse your body with warm water from the neck down.  8. DO NOT shower/wash with your normal soap after using and rinsing off the CHG Soap.  9. Pat yourself dry with a CLEAN TOWEL.  10. Wear CLEAN PAJAMAS to bed the night before surgery  11. Place CLEAN SHEETS on your bed the night  of your first shower and DO NOT SLEEP WITH PETS.  Day of Surgery: Wear Clean/Comfortable clothing the morning of surgery Do not apply any deodorants/lotions.   Remember to brush your teeth WITH YOUR REGULAR TOOTHPASTE.   Please read over the following fact sheets that you were given.

## 2019-12-11 ENCOUNTER — Encounter (HOSPITAL_COMMUNITY)
Admission: RE | Admit: 2019-12-11 | Discharge: 2019-12-11 | Disposition: A | Discharge status: Home / Self Care | Payer: Medicare HMO | Source: Ambulatory Visit | Attending: Vascular Surgery | Admitting: Vascular Surgery

## 2019-12-11 ENCOUNTER — Telehealth: Payer: Self-pay | Admitting: Vascular Surgery

## 2019-12-11 ENCOUNTER — Other Ambulatory Visit: Payer: Self-pay

## 2019-12-11 ENCOUNTER — Encounter (HOSPITAL_COMMUNITY): Payer: Self-pay

## 2019-12-11 ENCOUNTER — Other Ambulatory Visit: Payer: Self-pay | Admitting: *Deleted

## 2019-12-11 DIAGNOSIS — Z79899 Other long term (current) drug therapy: Secondary | ICD-10-CM | POA: Diagnosis not present

## 2019-12-11 DIAGNOSIS — I10 Essential (primary) hypertension: Secondary | ICD-10-CM | POA: Diagnosis not present

## 2019-12-11 DIAGNOSIS — E119 Type 2 diabetes mellitus without complications: Secondary | ICD-10-CM | POA: Diagnosis not present

## 2019-12-11 DIAGNOSIS — Z01812 Encounter for preprocedural laboratory examination: Secondary | ICD-10-CM | POA: Insufficient documentation

## 2019-12-11 DIAGNOSIS — I6523 Occlusion and stenosis of bilateral carotid arteries: Secondary | ICD-10-CM | POA: Insufficient documentation

## 2019-12-11 DIAGNOSIS — Z7902 Long term (current) use of antithrombotics/antiplatelets: Secondary | ICD-10-CM | POA: Insufficient documentation

## 2019-12-11 HISTORY — DX: Nonrheumatic aortic (valve) stenosis: I35.0

## 2019-12-11 HISTORY — DX: Peripheral vascular disease, unspecified: I73.9

## 2019-12-11 LAB — COMPREHENSIVE METABOLIC PANEL
ALT: 31 U/L (ref 0–44)
AST: 25 U/L (ref 15–41)
Albumin: 3.3 g/dL — ABNORMAL LOW (ref 3.5–5.0)
Alkaline Phosphatase: 84 U/L (ref 38–126)
Anion gap: 11 (ref 5–15)
BUN: 13 mg/dL (ref 8–23)
CO2: 27 mmol/L (ref 22–32)
Calcium: 9.2 mg/dL (ref 8.9–10.3)
Chloride: 100 mmol/L (ref 98–111)
Creatinine, Ser: 1.01 mg/dL (ref 0.61–1.24)
GFR calc Af Amer: >60 mL/min (ref >60)
GFR calc non Af Amer: >60 mL/min (ref >60)
Glucose, Bld: 142 mg/dL — ABNORMAL HIGH (ref 70–99)
Potassium: 3.8 mmol/L (ref 3.5–5.1)
Sodium: 138 mmol/L (ref 135–145)
Total Bilirubin: 0.1 mg/dL — ABNORMAL LOW (ref 0.3–1.2)
Total Protein: 8.1 g/dL (ref 6.5–8.1)

## 2019-12-11 LAB — CBC
HCT: 43.4 % (ref 39.0–52.0)
Hemoglobin: 14.1 g/dL (ref 13.0–17.0)
MCH: 27.9 pg (ref 26.0–34.0)
MCHC: 32.5 g/dL (ref 30.0–36.0)
MCV: 85.9 fL (ref 80.0–100.0)
Platelets: 406 10*3/uL — ABNORMAL HIGH (ref 150–400)
RBC: 5.05 MIL/uL (ref 4.22–5.81)
RDW: 13.2 % (ref 11.5–15.5)
WBC: 8.9 10*3/uL (ref 4.0–10.5)
nRBC: 0 % (ref 0.0–0.2)

## 2019-12-11 LAB — URINALYSIS, ROUTINE W REFLEX MICROSCOPIC
Bacteria, UA: NONE SEEN
Bilirubin Urine: NEGATIVE
Glucose, UA: NEGATIVE mg/dL
Hgb urine dipstick: NEGATIVE
Ketones, ur: NEGATIVE mg/dL
Leukocytes,Ua: NEGATIVE
Nitrite: NEGATIVE
Protein, ur: 100 mg/dL — AB
Specific Gravity, Urine: 1.017 (ref 1.005–1.030)
pH: 5 (ref 5.0–8.0)

## 2019-12-11 LAB — TYPE AND SCREEN
ABO/RH(D): O POS
Antibody Screen: NEGATIVE

## 2019-12-11 LAB — GLUCOSE, CAPILLARY: Glucose-Capillary: 143 mg/dL — ABNORMAL HIGH (ref 70–99)

## 2019-12-11 LAB — PROTIME-INR
INR: 1 (ref 0.8–1.2)
Prothrombin Time: 13.2 seconds (ref 11.4–15.2)

## 2019-12-11 LAB — APTT: aPTT: 34 seconds (ref 24–36)

## 2019-12-11 LAB — SURGICAL PCR SCREEN
MRSA, PCR: NEGATIVE
Staphylococcus aureus: NEGATIVE

## 2019-12-11 NOTE — Addendum Note (Signed)
Addended by: Nicholas Lose on: 12/11/2019 11:02 AM   Modules accepted: Orders

## 2019-12-11 NOTE — Progress Notes (Signed)
PCP - Dr. [name] Cardiologist - Dr. Quay Burow  PPM/ICD - denies  Chest x-ray - N/A EKG - 12/02/2019 Stress Test - 08/30/2019 ECHO - 08/27/2019 Cardiac Cath - denies  Sleep Study - denies CPAP - N/A   Fasting Blood Sugar - 130 - 150 Checks Blood Sugar - 4 - 5 times/day (patient has a Freestyle Libre device)  Blood Thinner and Aspirin Instructions: per patient, to continue taking as scheduled and on the day of surgery  ERAS Protcol - No  COVID TEST- Scheduled for 12/14/2019. Patient verbalized understanding of self-quarantine instructions, appointment time and place.  Anesthesia review: YES, vascular hx  Patient denies shortness of breath, fever, cough and chest pain at PAT appointment  All instructions explained to the patient, with a verbal understanding of the material. Patient agrees to go over the instructions while at home for a better understanding. Patient also instructed to self quarantine after being tested for COVID-19. The opportunity to ask questions was provided.

## 2019-12-11 NOTE — Telephone Encounter (Signed)
Dr Oneida Alar will contact patient to discuss

## 2019-12-11 NOTE — Telephone Encounter (Signed)
Patient called to discuss switching his procedure from carotid endarterectomy to carotid stent.  We discussed the TCAR procedure that it is slightly less invasive and risk of stroke is fairly equivalent to carotid endarterectomy.  I also emphasized to him that it is important to continue to take his Plavix aspirin and statin periprocedure.  He will take this with a small sip of water the morning of his procedure.  Instead of doing a left carotid endarterectomy we will now do a left TCAR stent followed by interval stenting in the near future.  He would be considered higher risk for carotid endarterectomy secondary to aortic stenosis and moderate LV dysfunction.  He was recently evaluated by Dr. Alvester Chou and thought to be of moderate cardiac risk.  Ruta Hinds, MD [facility] Office: [phone]

## 2019-12-12 ENCOUNTER — Encounter (HOSPITAL_COMMUNITY): Payer: Self-pay

## 2019-12-12 NOTE — Anesthesia Preprocedure Evaluation (Addendum)
Anesthesia Evaluation  Patient identified by MRN, date of birth, ID band Patient awake    Reviewed: Allergy & Precautions, NPO status , Patient's Chart, lab work & pertinent test results, reviewed documented beta blocker date and time   Airway Mallampati: II  TM Distance: >3 FB Neck ROM: Full    Dental  (+) Teeth Intact, Dental Advisory Given   Pulmonary neg pulmonary ROS,    breath sounds clear to auscultation       Cardiovascular hypertension, Pt. on medications and Pt. on home beta blockers + Peripheral Vascular Disease  + Valvular Problems/Murmurs AS  Rhythm:Regular Rate:Normal  Carotid US 11/07/19: Summary:  - Bilateral Carotid: Velocities in BICA are consistent with a 80-99% stenosis.  - Vertebrals: Bilateral vertebral arteries demonstrate antegrade flow.  - Subclavians: Normal flow hemodynamics were seen in bilateral subclavian arteries.   Nuclear stress test 08/30/19: The left ventricular ejection fraction is moderately decreased (30-44%). Nuclear stress EF: 38%. There is a medium defect of moderate severity present in the basal inferior, mid inferior and apex location. Findings consistent with prior myocardial infarction. Medium size, moderate intensity fixed inferior perfusion defect, likely scar. LVEF 38% with inferior akinesis. Intermediate risk study.    Echo 08/27/19: 1. Left ventricular ejection fraction, by estimation, is 40 to 45%. The  left ventricle has mildly decreased function. The left ventricle  demonstrates global hypokinesis. There is mild concentric left ventricular  hypertrophy. Left ventricular diastolic  parameters are consistent with Grade I diastolic dysfunction (impaired  relaxation). The average left ventricular global longitudinal strain is  -13.1 %.  2. Right ventricular systolic function is normal. The right ventricular  size is normal. There is normal pulmonary artery systolic pressure.   3. Left atrial size was moderately dilated.  4. The mitral valve is normal in structure. No evidence of mitral valve  regurgitation. No evidence of mitral stenosis.  5. The aortic valve is normal in structure. Aortic valve regurgitation is  not visualized. Moderate to severe aortic valve stenosis. Aortic valve  mean gradient measures 13.0 mmHg. Aortic valve Vmax measures 2.35 m/s.  6. The inferior vena cava is normal in size with greater than 50%  respiratory variability, suggesting right atrial pressure of 3 mmHg.      Neuro/Psych negative neurological ROS  negative psych ROS   GI/Hepatic negative GI ROS, Neg liver ROS,   Endo/Other  diabetes, Type 2, Insulin Dependent, Oral Hypoglycemic Agents  Renal/GU      Musculoskeletal negative musculoskeletal ROS (+)   Abdominal Normal abdominal exam  (+)   Peds  Hematology negative hematology ROS (+)   Anesthesia Other Findings   Reproductive/Obstetrics                           Anesthesia Physical Anesthesia Plan  ASA: III  Anesthesia Plan: General   Post-op Pain Management:    Induction: Intravenous  PONV Risk Score and Plan: 3 and Ondansetron, Dexamethasone and Midazolam  Airway Management Planned: Oral ETT  Additional Equipment: Arterial line  Intra-op Plan:   Post-operative Plan: Extubation in OR  Informed Consent: I have reviewed the patients History and Physical, chart, labs and discussed the procedure including the risks, benefits and alternatives for the proposed anesthesia with the patient or authorized representative who has indicated his/her understanding and acceptance.     Dental advisory given  Plan Discussed with: CRNA  Anesthesia Plan Comments: (See PAT note written 12/12/2019 by Myra Gianotti, PA-C.  Lab Results      Component                Value               Date                      WBC                      8.9                 12/11/2019                HGB                       14.1                12/11/2019                HCT                      43.4                12/11/2019                MCV                      85.9                12/11/2019                PLT                      406 (H)             12/11/2019            Lab Results      Component                Value               Date                      CREATININE               1.01                12/11/2019                BUN                      13                  12/11/2019                NA                       138                 12/11/2019                K                        3.8                 12/11/2019  CL                       100                 12/11/2019                CO2                      27                  12/11/2019            Echo:  1. Left ventricular ejection fraction, by estimation, is 40 to 45%. The  left ventricle has mildly decreased function. The left ventricle  demonstrates global hypokinesis. There is mild concentric left ventricular  hypertrophy. Left ventricular diastolic  parameters are consistent with Grade I diastolic dysfunction (impaired  relaxation). The average left ventricular global longitudinal strain is  -13.1 %.  2. Right ventricular systolic function is normal. The right ventricular  size is normal. There is normal pulmonary artery systolic pressure.  3. Left atrial size was moderately dilated.  4. The mitral valve is normal in structure. No evidence of mitral valve  regurgitation. No evidence of mitral stenosis.  5. The aortic valve is normal in structure. Aortic valve regurgitation is  not visualized. Moderate to severe aortic valve stenosis. Aortic valve  mean gradient measures 13.0 mmHg. Aortic valve Vmax measures 2.35 m/s.  6. The inferior vena cava is normal in size with greater than 50%  respiratory variability, suggesting right atrial pressure of 3 mmHg. )      Anesthesia Quick Evaluation

## 2019-12-12 NOTE — Progress Notes (Signed)
Anesthesia Chart Review:  Case: [id number] Date/Time: 12/16/19 0715   Procedure: TRANSCAROTID ARTERY REVASCULARIZATION (Left )   Anesthesia type: General   Pre-op diagnosis: carotid stenosis   [facility]: [facility] ROOM 20 / [facility] OR   Surgeons: Elam Dutch, MD      DISCUSSION: Patient is a 69 year old male scheduled for the above procedure. He was recently found to have asymptomatic BICA stenosis, > 80% by [place]. He was considered for transcarotid artery revascularization (TCAR) versus carotid endarterectomy (CEA). On 12/02/19, cardiologist Dr. Gwenlyn Found wrote, "He does have a low risk Myoview performed back in June as well as moderately severe aortic stenosis however he is asymptomatic from this.  I cleared him at mildly elevated risk given his aortic stenosis and moderate LV dysfunction." Ultimtately decision made to proceed with TCAR given his AS and LV dysfunction.   History includes never smoker, DM2, HTN, hypercholesterolemia, murmur/AS (moderate-severe AS 08/2019), neuropathy, pancreatitis (08/2009, 07/2012), carotid artery disease, PAD (s/p right popliteal & SFA atherectomy/angioplasty 08/08/19; PTA/stenting left popliteal artery 11/18/19). June 2021 stress test revealed likely prior inferior infarc, inferior akinesis, LVEF 38%, no ischemia.    Last A1c 7.6% 11/18/19, He has a [employer] device.  He reported home fasting glucose readings ~130-150.  He is to continue ASA and Plavix perioperatively.  Presurgical COVID-19 test is scheduled for 12/14/2019.  Anesthesia team to evaluate on the day of surgery.   VS: BP (!) 150/99   Pulse 85   Temp 36.7 C (Oral)   Resp 18   Ht '5\' 8"'  (1.727 m)   Wt 80.6 kg   SpO2 100%   BMI 27.00 kg/m   BP Readings from Last 3 Encounters:  12/11/19 (!) 150/99  12/02/19 98/62  11/19/19 122/90    PROVIDERS: [name], MD is PCP Quay Burow, MD is cardiologist   LABS: Labs reviewed: Acceptable for surgery. A1c 7.6% on 11/18/19.  (all labs ordered are  listed, but only abnormal results are displayed)  Labs Reviewed  GLUCOSE, CAPILLARY - Abnormal; Notable for the following components:      Result Value   Glucose-Capillary 143 (*)    All other components within normal limits  CBC - Abnormal; Notable for the following components:   Platelets 406 (*)    All other components within normal limits  COMPREHENSIVE METABOLIC PANEL - Abnormal; Notable for the following components:   Glucose, Bld 142 (*)    Albumin 3.3 (*)    Total Bilirubin 0.1 (*)    All other components within normal limits  URINALYSIS, ROUTINE W REFLEX MICROSCOPIC - Abnormal; Notable for the following components:   Protein, ur 100 (*)    All other components within normal limits  SURGICAL PCR SCREEN  APTT  PROTIME-INR  TYPE AND SCREEN     IMAGES: CTA head & neck 11/13/19: IMPRESSION: - No acute intracranial abnormality. Mild chronic microvascular ischemic changes. - Mixed but primarily noncalcified plaque at the ICA origins causing approximately 70% stenosis. - No significant intracranial stenosis.   EKG: 12/02/19: Sinus rhythm with marked sinus arrhythmia   CV: Carotid US 11/07/19: Summary:  - Right Carotid: Velocities in the right ICA are consistent with a 80-99% stenosis.  - Left Carotid: Velocities in the left ICA are consistent with a 80-99% stenosis.  - Vertebrals: Bilateral vertebral arteries demonstrate antegrade flow.  - Subclavians: Normal flow hemodynamics were seen in bilateral subclavian arteries.    Nuclear stress test 08/30/19:  The left ventricular ejection fraction is moderately decreased (30-44%).  Nuclear stress EF: 38%.  No T wave inversion was noted during stress.  There was no ST segment deviation noted during stress.  Defect 1: There is a medium defect of moderate severity present in the basal inferior, mid inferior and apex location.  This is an intermediate risk study.  Findings consistent with prior myocardial  infarction. Medium size, moderate intensity fixed inferior perfusion defect, likely scar. LVEF 38% with inferior akinesis. This is an intermediate risk study. No prior study for comparison.   Echo 08/27/19: IMPRESSIONS  1. Left ventricular ejection fraction, by estimation, is 40 to 45%. The  left ventricle has mildly decreased function. The left ventricle  demonstrates global hypokinesis. There is mild concentric left ventricular  hypertrophy. Left ventricular diastolic  parameters are consistent with Grade I diastolic dysfunction (impaired  relaxation). The average left ventricular global longitudinal strain is  -13.1 %.  2. Right ventricular systolic function is normal. The right ventricular  size is normal. There is normal pulmonary artery systolic pressure.  3. Left atrial size was moderately dilated.  4. The mitral valve is normal in structure. No evidence of mitral valve  regurgitation. No evidence of mitral stenosis.  5. The aortic valve is normal in structure. Aortic valve regurgitation is  not visualized. Moderate to severe aortic valve stenosis. Aortic valve  mean gradient measures 13.0 mmHg. Aortic valve Vmax measures 2.35 m/s.  6. The inferior vena cava is normal in size with greater than 50%  respiratory variability, suggesting right atrial pressure of 3 mmHg.    Past Medical History:  Diagnosis Date  . Aortic stenosis   . Carotid artery occlusion   . Diabetes mellitus without complication ([place])   . Dyslipidemia 09/27/2012  . Erectile dysfunction 09/27/2012  . Heart murmur   . Hypercholesteremia   . Hyperlipidemia 08/12/2012  . Hypertension   . Hyponatremia 08/14/2012  . Neuropathy   . Pancreatitis   . Pancreatitis, acute 09/27/2012  . Peripheral vascular disease ([place])   . Vitamin D deficiency 06/23/2015    Past Surgical History:  Procedure Laterality Date  . ABDOMINAL AORTOGRAM W/LOWER EXTREMITY N/A 08/08/2019   Procedure: ABDOMINAL AORTOGRAM W/LOWER  EXTREMITY;  Surgeon: Lorretta Harp, MD;  Location: [facility];  Service: Cardiovascular;  Laterality: N/A;  . ABDOMINAL AORTOGRAM W/LOWER EXTREMITY N/A 11/18/2019   Procedure: ABDOMINAL AORTOGRAM W/LOWER EXTREMITY;  Surgeon: Lorretta Harp, MD;  Location: [facility];  Service: Cardiovascular;  Laterality: N/A;  . COLONOSCOPY  12 years ago   in [place] clinic= normal exam per pt  . KNEE SURGERY    . PERIPHERAL VASCULAR INTERVENTION Right 08/08/2019   Procedure: PERIPHERAL VASCULAR INTERVENTION;  Surgeon: Lorretta Harp, MD;  Location: [facility];  Service: Cardiovascular;  Laterality: Right;  . PERIPHERAL VASCULAR INTERVENTION Left 11/18/2019   Procedure: PERIPHERAL VASCULAR INTERVENTION;  Surgeon: Lorretta Harp, MD;  Location: [facility];  Service: Cardiovascular;  Laterality: Left;  left popliteal artery    MEDICATIONS: . Acetaminophen-Codeine (TYLENOL/CODEINE #3) 300-30 MG tablet  . aspirin EC 81 MG tablet  . Blood Glucose Monitoring Suppl (ONETOUCH VERIO) w/Device KIT  . clopidogrel (PLAVIX) 75 MG tablet  . collagenase (SANTYL) ointment  . Continuous Blood Gluc Receiver (FREESTYLE LIBRE 14 DAY READER) DEVI  . Continuous Blood Gluc Sensor (FREESTYLE LIBRE 14 DAY SENSOR) MISC  . doxycycline (VIBRA-TABS) 100 MG tablet  . doxycycline (VIBRA-TABS) 100 MG tablet  . gabapentin (NEURONTIN) 300 MG capsule  . glucose blood  test strip  . HUMULIN 70/30 (70-30) 100 UNIT/ML injection  . lisinopril-hydrochlorothiazide (ZESTORETIC) 20-12.5 MG tablet  . metFORMIN (GLUCOPHAGE) 1000 MG tablet  . metoprolol tartrate (LOPRESSOR) 100 MG tablet  . ondansetron (ZOFRAN) 4 MG tablet  . ONETOUCH DELICA LANCETS 11B MISC  . rosuvastatin (CRESTOR) 40 MG tablet  . sildenafil (VIAGRA) 50 MG tablet  . silver sulfADIAZINE (SILVADENE) 1 % cream   . sodium chloride flush (NS) 0.9 % injection 3 mL  . sodium chloride flush (NS) 0.9 % injection 3 mL    Myra Gianotti,  PA-C Surgical Short Stay/Anesthesiology [facility] Phone [phone] [facility] Phone [phone] 12/12/2019 12:15 PM

## 2019-12-13 ENCOUNTER — Other Ambulatory Visit (HOSPITAL_COMMUNITY): Payer: Medicare HMO

## 2019-12-13 ENCOUNTER — Ambulatory Visit (INDEPENDENT_AMBULATORY_CARE_PROVIDER_SITE_OTHER): Payer: Medicare HMO | Admitting: Podiatry

## 2019-12-13 ENCOUNTER — Other Ambulatory Visit: Payer: Self-pay

## 2019-12-13 DIAGNOSIS — M869 Osteomyelitis, unspecified: Secondary | ICD-10-CM

## 2019-12-14 ENCOUNTER — Other Ambulatory Visit (HOSPITAL_COMMUNITY)
Admission: RE | Admit: 2019-12-14 | Discharge: 2019-12-14 | Disposition: A | Discharge status: Home / Self Care | Payer: Medicare HMO | Source: Ambulatory Visit | Attending: Vascular Surgery | Admitting: Vascular Surgery

## 2019-12-14 DIAGNOSIS — Z79899 Other long term (current) drug therapy: Secondary | ICD-10-CM | POA: Diagnosis not present

## 2019-12-14 DIAGNOSIS — I959 Hypotension, unspecified: Secondary | ICD-10-CM | POA: Diagnosis not present

## 2019-12-14 DIAGNOSIS — I10 Essential (primary) hypertension: Secondary | ICD-10-CM | POA: Diagnosis present

## 2019-12-14 DIAGNOSIS — E785 Hyperlipidemia, unspecified: Secondary | ICD-10-CM | POA: Diagnosis present

## 2019-12-14 DIAGNOSIS — E119 Type 2 diabetes mellitus without complications: Secondary | ICD-10-CM | POA: Diagnosis not present

## 2019-12-14 DIAGNOSIS — Z7982 Long term (current) use of aspirin: Secondary | ICD-10-CM | POA: Diagnosis not present

## 2019-12-14 DIAGNOSIS — Z01818 Encounter for other preprocedural examination: Secondary | ICD-10-CM | POA: Insufficient documentation

## 2019-12-14 DIAGNOSIS — Z885 Allergy status to narcotic agent status: Secondary | ICD-10-CM | POA: Diagnosis not present

## 2019-12-14 DIAGNOSIS — Z794 Long term (current) use of insulin: Secondary | ICD-10-CM | POA: Diagnosis not present

## 2019-12-14 DIAGNOSIS — I2510 Atherosclerotic heart disease of native coronary artery without angina pectoris: Secondary | ICD-10-CM | POA: Diagnosis present

## 2019-12-14 DIAGNOSIS — I6522 Occlusion and stenosis of left carotid artery: Secondary | ICD-10-CM | POA: Diagnosis present

## 2019-12-14 DIAGNOSIS — Z7902 Long term (current) use of antithrombotics/antiplatelets: Secondary | ICD-10-CM | POA: Diagnosis not present

## 2019-12-14 DIAGNOSIS — F17210 Nicotine dependence, cigarettes, uncomplicated: Secondary | ICD-10-CM | POA: Diagnosis present

## 2019-12-14 DIAGNOSIS — Z20822 Contact with and (suspected) exposure to covid-19: Secondary | ICD-10-CM | POA: Diagnosis present

## 2019-12-14 LAB — SARS CORONAVIRUS 2 (TAT 6-24 HRS): SARS Coronavirus 2: NEGATIVE

## 2019-12-16 ENCOUNTER — Inpatient Hospital Stay (HOSPITAL_COMMUNITY): Payer: Medicare HMO | Admitting: Anesthesiology

## 2019-12-16 ENCOUNTER — Telehealth: Payer: Self-pay

## 2019-12-16 ENCOUNTER — Inpatient Hospital Stay (HOSPITAL_COMMUNITY): Payer: Medicare HMO | Admitting: Physician Assistant

## 2019-12-16 ENCOUNTER — Encounter (HOSPITAL_COMMUNITY)
Admission: RE | Disposition: A | Discharge status: Home / Self Care | Payer: Self-pay | Source: Home / Self Care | Attending: Vascular Surgery

## 2019-12-16 ENCOUNTER — Other Ambulatory Visit: Payer: Self-pay

## 2019-12-16 ENCOUNTER — Inpatient Hospital Stay (HOSPITAL_COMMUNITY): Payer: Medicare HMO

## 2019-12-16 ENCOUNTER — Encounter (HOSPITAL_COMMUNITY): Payer: Self-pay | Admitting: Vascular Surgery

## 2019-12-16 ENCOUNTER — Inpatient Hospital Stay (HOSPITAL_COMMUNITY)
Admission: RE | Admit: 2019-12-16 | Discharge: 2019-12-17 | DRG: 036 | Disposition: A | Discharge status: Home / Self Care | Payer: Medicare HMO | Attending: Vascular Surgery | Admitting: Vascular Surgery

## 2019-12-16 DIAGNOSIS — Z7982 Long term (current) use of aspirin: Secondary | ICD-10-CM

## 2019-12-16 DIAGNOSIS — F17210 Nicotine dependence, cigarettes, uncomplicated: Secondary | ICD-10-CM | POA: Diagnosis not present

## 2019-12-16 DIAGNOSIS — Z885 Allergy status to narcotic agent status: Secondary | ICD-10-CM

## 2019-12-16 DIAGNOSIS — Z794 Long term (current) use of insulin: Secondary | ICD-10-CM

## 2019-12-16 DIAGNOSIS — I959 Hypotension, unspecified: Secondary | ICD-10-CM | POA: Diagnosis not present

## 2019-12-16 DIAGNOSIS — Z7902 Long term (current) use of antithrombotics/antiplatelets: Secondary | ICD-10-CM

## 2019-12-16 DIAGNOSIS — E785 Hyperlipidemia, unspecified: Secondary | ICD-10-CM | POA: Diagnosis present

## 2019-12-16 DIAGNOSIS — Z20822 Contact with and (suspected) exposure to covid-19: Secondary | ICD-10-CM | POA: Diagnosis present

## 2019-12-16 DIAGNOSIS — I10 Essential (primary) hypertension: Secondary | ICD-10-CM | POA: Diagnosis present

## 2019-12-16 DIAGNOSIS — I6522 Occlusion and stenosis of left carotid artery: Principal | ICD-10-CM | POA: Diagnosis present

## 2019-12-16 DIAGNOSIS — Z79899 Other long term (current) drug therapy: Secondary | ICD-10-CM | POA: Diagnosis not present

## 2019-12-16 DIAGNOSIS — I739 Peripheral vascular disease, unspecified: Secondary | ICD-10-CM

## 2019-12-16 DIAGNOSIS — E119 Type 2 diabetes mellitus without complications: Secondary | ICD-10-CM | POA: Diagnosis not present

## 2019-12-16 DIAGNOSIS — I6529 Occlusion and stenosis of unspecified carotid artery: Secondary | ICD-10-CM | POA: Diagnosis present

## 2019-12-16 DIAGNOSIS — Z9889 Other specified postprocedural states: Secondary | ICD-10-CM

## 2019-12-16 DIAGNOSIS — I6523 Occlusion and stenosis of bilateral carotid arteries: Secondary | ICD-10-CM

## 2019-12-16 DIAGNOSIS — I2510 Atherosclerotic heart disease of native coronary artery without angina pectoris: Secondary | ICD-10-CM | POA: Diagnosis present

## 2019-12-16 HISTORY — PX: TRANSCAROTID ARTERY REVASCULARIZATIONÂ: SHX6778

## 2019-12-16 HISTORY — PX: ULTRASOUND GUIDANCE FOR VASCULAR ACCESS: SHX6516

## 2019-12-16 LAB — GLUCOSE, CAPILLARY
Glucose-Capillary: 141 mg/dL — ABNORMAL HIGH (ref 70–99)
Glucose-Capillary: 119 mg/dL — ABNORMAL HIGH (ref 70–99)
Glucose-Capillary: 118 mg/dL — ABNORMAL HIGH (ref 70–99)
Glucose-Capillary: 158 mg/dL — ABNORMAL HIGH (ref 70–99)
Glucose-Capillary: 186 mg/dL — ABNORMAL HIGH (ref 70–99)

## 2019-12-16 LAB — ABO/RH: ABO/RH(D): O POS

## 2019-12-16 SURGERY — TRANSCAROTID ARTERY REVASCULARIZATION (TCAR)
Anesthesia: General | Site: Neck | Laterality: Right

## 2019-12-16 MED ORDER — CHLORHEXIDINE GLUCONATE CLOTH 2 % EX PADS: 6.0000 | MEDICATED_PAD | Freq: Once | CUTANEOUS | Status: DC

## 2019-12-16 MED ORDER — PHENYLEPHRINE HCL-NACL 10-0.9 MG/250ML-% IV SOLN
0.0000 ug/min | INTRAVENOUS | Status: DC
  Administered 2019-12-16: 25 ug/min via INTRAVENOUS

## 2019-12-16 MED ORDER — LACTATED RINGERS IV SOLN: INTRAVENOUS | Status: DC | PRN

## 2019-12-16 MED ORDER — SODIUM CHLORIDE 0.9 % IV SOLN
INTRAVENOUS | Status: DC | PRN
  Administered 2019-12-16: 500 mL

## 2019-12-16 MED ORDER — HYDROMORPHONE HCL 1 MG/ML IJ SOLN
0.2500 mg | INTRAMUSCULAR | Status: DC | PRN
  Administered 2019-12-16 (×2): 0.5 mg via INTRAVENOUS

## 2019-12-16 MED ORDER — SODIUM CHLORIDE 0.9 % IV SOLN
500.0000 mL | Freq: Once | INTRAVENOUS | Status: AC | PRN
  Administered 2019-12-16: 500 mL via INTRAVENOUS

## 2019-12-16 MED ORDER — ONDANSETRON HCL 4 MG/2ML IJ SOLN
INTRAMUSCULAR | Status: DC | PRN
  Administered 2019-12-16: 4 mg via INTRAVENOUS

## 2019-12-16 MED ORDER — MIDAZOLAM HCL 2 MG/2ML IJ SOLN
INTRAMUSCULAR | Status: AC
  Filled 2019-12-16: qty 2

## 2019-12-16 MED ORDER — PHENOL 1.4 % MT LIQD: 1.0000 | OROMUCOSAL | Status: DC | PRN

## 2019-12-16 MED ORDER — EPHEDRINE SULFATE-NACL 50-0.9 MG/10ML-% IV SOSY
PREFILLED_SYRINGE | INTRAVENOUS | Status: DC | PRN
  Administered 2019-12-16: 5 mg via INTRAVENOUS

## 2019-12-16 MED ORDER — ROCURONIUM BROMIDE 10 MG/ML (PF) SYRINGE
PREFILLED_SYRINGE | INTRAVENOUS | Status: AC
  Filled 2019-12-16: qty 10

## 2019-12-16 MED ORDER — 0.9 % SODIUM CHLORIDE (POUR BTL) OPTIME
TOPICAL | Status: DC | PRN
  Administered 2019-12-16: 1000 mL

## 2019-12-16 MED ORDER — FENTANYL CITRATE (PF) 250 MCG/5ML IJ SOLN
INTRAMUSCULAR | Status: DC | PRN
Start: 2019-12-16 — End: 2019-12-16
  Administered 2019-12-16: 25 ug via INTRAVENOUS
  Administered 2019-12-16: 100 ug via INTRAVENOUS

## 2019-12-16 MED ORDER — PROPOFOL 10 MG/ML IV BOLUS
INTRAVENOUS | Status: AC
  Filled 2019-12-16: qty 20

## 2019-12-16 MED ORDER — ALBUMIN HUMAN 5 % IV SOLN
12.5000 g | Freq: Once | INTRAVENOUS | Status: AC
  Administered 2019-12-16: 12.5 g via INTRAVENOUS

## 2019-12-16 MED ORDER — HYDROCHLOROTHIAZIDE 25 MG PO TABS: 25.0000 mg | ORAL_TABLET | Freq: Every day | ORAL | Status: DC

## 2019-12-16 MED ORDER — PHENYLEPHRINE HCL-NACL 10-0.9 MG/250ML-% IV SOLN
INTRAVENOUS | Status: DC | PRN
  Administered 2019-12-16: 25 ug/min via INTRAVENOUS

## 2019-12-16 MED ORDER — ROCURONIUM BROMIDE 10 MG/ML (PF) SYRINGE
PREFILLED_SYRINGE | INTRAVENOUS | Status: DC | PRN
  Administered 2019-12-16: 50 mg via INTRAVENOUS
  Administered 2019-12-16: 20 mg via INTRAVENOUS

## 2019-12-16 MED ORDER — PROTAMINE SULFATE 10 MG/ML IV SOLN
INTRAVENOUS | Status: AC
  Filled 2019-12-16: qty 5

## 2019-12-16 MED ORDER — ALUM & MAG HYDROXIDE-SIMETH 200-200-20 MG/5ML PO SUSP: 15.0000 mL | ORAL | Status: DC | PRN

## 2019-12-16 MED ORDER — MEPERIDINE HCL 25 MG/ML IJ SOLN: 6.2500 mg | INTRAMUSCULAR | Status: DC | PRN

## 2019-12-16 MED ORDER — ACETAMINOPHEN 10 MG/ML IV SOLN: 1000.0000 mg | Freq: Once | INTRAVENOUS | Status: DC | PRN

## 2019-12-16 MED ORDER — TRAMADOL HCL 50 MG PO TABS: 50.0000 mg | ORAL_TABLET | Freq: Four times a day (QID) | ORAL | Status: DC | PRN

## 2019-12-16 MED ORDER — LABETALOL HCL 5 MG/ML IV SOLN: 10.0000 mg | INTRAVENOUS | Status: DC | PRN

## 2019-12-16 MED ORDER — HEPARIN SODIUM (PORCINE) 1000 UNIT/ML IJ SOLN
INTRAMUSCULAR | Status: DC | PRN
  Administered 2019-12-16: 8000 [IU] via INTRAVENOUS

## 2019-12-16 MED ORDER — IODIXANOL 320 MG/ML IV SOLN
INTRAVENOUS | Status: DC | PRN
  Administered 2019-12-16: 20 mL

## 2019-12-16 MED ORDER — MORPHINE SULFATE (PF) 2 MG/ML IV SOLN
2.0000 mg | INTRAVENOUS | Status: DC | PRN
  Administered 2019-12-16: 2 mg via INTRAVENOUS
  Filled 2019-12-16: qty 1

## 2019-12-16 MED ORDER — CHLORHEXIDINE GLUCONATE 0.12 % MT SOLN
15.0000 mL | Freq: Once | OROMUCOSAL | Status: AC
  Administered 2019-12-16: 15 mL via OROMUCOSAL
  Filled 2019-12-16: qty 15

## 2019-12-16 MED ORDER — METOPROLOL TARTRATE 5 MG/5ML IV SOLN: 2.0000 mg | INTRAVENOUS | Status: DC | PRN

## 2019-12-16 MED ORDER — HYDRALAZINE HCL 20 MG/ML IJ SOLN: 5.0000 mg | INTRAMUSCULAR | Status: DC | PRN

## 2019-12-16 MED ORDER — MAGNESIUM SULFATE 2 GM/50ML IV SOLN: 2.0000 g | Freq: Every day | INTRAVENOUS | Status: DC | PRN

## 2019-12-16 MED ORDER — PANTOPRAZOLE SODIUM 40 MG PO TBEC
40.0000 mg | DELAYED_RELEASE_TABLET | Freq: Every day | ORAL | Status: DC
  Administered 2019-12-16 – 2019-12-17 (×2): 40 mg via ORAL
  Filled 2019-12-16 (×2): qty 1

## 2019-12-16 MED ORDER — PHENYLEPHRINE 40 MCG/ML (10ML) SYRINGE FOR IV PUSH (FOR BLOOD PRESSURE SUPPORT)
PREFILLED_SYRINGE | INTRAVENOUS | Status: DC | PRN
  Administered 2019-12-16 (×5): 80 ug via INTRAVENOUS

## 2019-12-16 MED ORDER — ACETAMINOPHEN 325 MG PO TABS: 325.0000 mg | ORAL_TABLET | Freq: Once | ORAL | Status: DC | PRN

## 2019-12-16 MED ORDER — LISINOPRIL-HYDROCHLOROTHIAZIDE 20-12.5 MG PO TABS: 2.0000 | ORAL_TABLET | Freq: Every day | ORAL | Status: DC

## 2019-12-16 MED ORDER — SODIUM CHLORIDE 0.9 % IV SOLN: INTRAVENOUS | Status: DC

## 2019-12-16 MED ORDER — FENTANYL CITRATE (PF) 250 MCG/5ML IJ SOLN
INTRAMUSCULAR | Status: AC
  Filled 2019-12-16: qty 5

## 2019-12-16 MED ORDER — INSULIN ASPART 100 UNIT/ML ~~LOC~~ SOLN
0.0000 [IU] | Freq: Three times a day (TID) | SUBCUTANEOUS | Status: DC
  Administered 2019-12-16: 3 [IU] via SUBCUTANEOUS
  Administered 2019-12-17: 2 [IU] via SUBCUTANEOUS

## 2019-12-16 MED ORDER — LACTATED RINGERS IV SOLN: INTRAVENOUS | Status: DC

## 2019-12-16 MED ORDER — DOCUSATE SODIUM 100 MG PO CAPS
100.0000 mg | ORAL_CAPSULE | Freq: Every day | ORAL | Status: DC
  Administered 2019-12-17: 100 mg via ORAL
  Filled 2019-12-16: qty 1

## 2019-12-16 MED ORDER — CLOPIDOGREL BISULFATE 75 MG PO TABS
75.0000 mg | ORAL_TABLET | Freq: Every day | ORAL | Status: DC
  Administered 2019-12-17: 75 mg via ORAL
  Filled 2019-12-16: qty 1

## 2019-12-16 MED ORDER — LISINOPRIL 40 MG PO TABS: 40.0000 mg | ORAL_TABLET | Freq: Every day | ORAL | Status: DC

## 2019-12-16 MED ORDER — PROPOFOL 10 MG/ML IV BOLUS
INTRAVENOUS | Status: DC | PRN
  Administered 2019-12-16: 150 mg via INTRAVENOUS
  Administered 2019-12-16: 50 mg via INTRAVENOUS

## 2019-12-16 MED ORDER — PROTAMINE SULFATE 10 MG/ML IV SOLN
INTRAVENOUS | Status: DC | PRN
  Administered 2019-12-16 (×4): 20 mg via INTRAVENOUS

## 2019-12-16 MED ORDER — DEXAMETHASONE SODIUM PHOSPHATE 10 MG/ML IJ SOLN
INTRAMUSCULAR | Status: AC
  Filled 2019-12-16: qty 1

## 2019-12-16 MED ORDER — CEFAZOLIN SODIUM-DEXTROSE 2-4 GM/100ML-% IV SOLN
2.0000 g | Freq: Three times a day (TID) | INTRAVENOUS | Status: AC
  Administered 2019-12-16 (×2): 2 g via INTRAVENOUS
  Filled 2019-12-16 (×2): qty 100

## 2019-12-16 MED ORDER — HYDROMORPHONE HCL 1 MG/ML IJ SOLN
INTRAMUSCULAR | Status: AC
  Filled 2019-12-16: qty 1

## 2019-12-16 MED ORDER — GUAIFENESIN-DM 100-10 MG/5ML PO SYRP: 15.0000 mL | ORAL_SOLUTION | ORAL | Status: DC | PRN

## 2019-12-16 MED ORDER — METOPROLOL TARTRATE 100 MG PO TABS
100.0000 mg | ORAL_TABLET | Freq: Two times a day (BID) | ORAL | Status: DC
  Administered 2019-12-16 – 2019-12-17 (×2): 100 mg via ORAL
  Filled 2019-12-16 (×2): qty 1

## 2019-12-16 MED ORDER — ONDANSETRON HCL 4 MG/2ML IJ SOLN: 4.0000 mg | Freq: Four times a day (QID) | INTRAMUSCULAR | Status: DC | PRN

## 2019-12-16 MED ORDER — GLYCOPYRROLATE PF 0.2 MG/ML IJ SOSY
PREFILLED_SYRINGE | INTRAMUSCULAR | Status: DC | PRN
  Administered 2019-12-16: .2 mg via INTRAVENOUS

## 2019-12-16 MED ORDER — EPHEDRINE 5 MG/ML INJ
INTRAVENOUS | Status: AC
  Filled 2019-12-16: qty 10

## 2019-12-16 MED ORDER — ASPIRIN EC 81 MG PO TBEC
81.0000 mg | DELAYED_RELEASE_TABLET | Freq: Every day | ORAL | Status: DC
  Administered 2019-12-17: 81 mg via ORAL
  Filled 2019-12-16: qty 1

## 2019-12-16 MED ORDER — ROSUVASTATIN CALCIUM 20 MG PO TABS
40.0000 mg | ORAL_TABLET | Freq: Every day | ORAL | Status: DC
  Administered 2019-12-17: 40 mg via ORAL
  Filled 2019-12-16: qty 2

## 2019-12-16 MED ORDER — LIDOCAINE 2% (20 MG/ML) 5 ML SYRINGE
INTRAMUSCULAR | Status: DC | PRN
  Administered 2019-12-16: 50 mg via INTRAVENOUS

## 2019-12-16 MED ORDER — MIDAZOLAM HCL 5 MG/5ML IJ SOLN
INTRAMUSCULAR | Status: DC | PRN
  Administered 2019-12-16: 2 mg via INTRAVENOUS

## 2019-12-16 MED ORDER — AMISULPRIDE (ANTIEMETIC) 5 MG/2ML IV SOLN: 10.0000 mg | Freq: Once | INTRAVENOUS | Status: DC | PRN

## 2019-12-16 MED ORDER — POTASSIUM CHLORIDE CRYS ER 20 MEQ PO TBCR: 20.0000 meq | EXTENDED_RELEASE_TABLET | Freq: Every day | ORAL | Status: DC | PRN

## 2019-12-16 MED ORDER — GABAPENTIN 300 MG PO CAPS: 300.0000 mg | ORAL_CAPSULE | Freq: Every day | ORAL | Status: DC | PRN

## 2019-12-16 MED ORDER — ACETAMINOPHEN 160 MG/5ML PO SOLN: 325.0000 mg | Freq: Once | ORAL | Status: DC | PRN

## 2019-12-16 MED ORDER — LIDOCAINE 2% (20 MG/ML) 5 ML SYRINGE
INTRAMUSCULAR | Status: AC
  Filled 2019-12-16: qty 5

## 2019-12-16 MED ORDER — PHENYLEPHRINE 40 MCG/ML (10ML) SYRINGE FOR IV PUSH (FOR BLOOD PRESSURE SUPPORT)
PREFILLED_SYRINGE | INTRAVENOUS | Status: AC
  Filled 2019-12-16: qty 10

## 2019-12-16 MED ORDER — CEFAZOLIN SODIUM-DEXTROSE 2-4 GM/100ML-% IV SOLN
2.0000 g | INTRAVENOUS | Status: AC
  Administered 2019-12-16: 2 g via INTRAVENOUS
  Filled 2019-12-16: qty 100

## 2019-12-16 MED ORDER — SUGAMMADEX SODIUM 200 MG/2ML IV SOLN
INTRAVENOUS | Status: DC | PRN
  Administered 2019-12-16: 160 mg via INTRAVENOUS

## 2019-12-16 MED ORDER — ORAL CARE MOUTH RINSE: 15.0000 mL | Freq: Once | OROMUCOSAL | Status: AC

## 2019-12-16 MED ORDER — ACETAMINOPHEN 10 MG/ML IV SOLN
INTRAVENOUS | Status: AC
  Administered 2019-12-16: 1000 mg
  Filled 2019-12-16: qty 100

## 2019-12-16 MED ORDER — ONDANSETRON HCL 4 MG/2ML IJ SOLN
INTRAMUSCULAR | Status: AC
  Filled 2019-12-16: qty 2

## 2019-12-16 MED ORDER — GLYCOPYRROLATE PF 0.2 MG/ML IJ SOSY
PREFILLED_SYRINGE | INTRAMUSCULAR | Status: AC
  Filled 2019-12-16: qty 1

## 2019-12-16 SURGICAL SUPPLY — 63 items
ADH SKN CLS APL DERMABOND .7 (GAUZE/BANDAGES/DRESSINGS) ×2
AGENT HMST SPONGE THK3/8 (HEMOSTASIS)
BAG BANDED W/RUBBER/TAPE 36X54 (MISCELLANEOUS) ×3 IMPLANT
BAG EQP BAND 135X91 W/RBR TAPE (MISCELLANEOUS) ×2
BALLN STERLING RX 5X30X80 (BALLOONS) ×3
BALLOON STERLING RX 5X30X80 (BALLOONS) IMPLANT
CANISTER SUCT 3000ML PPV (MISCELLANEOUS) ×3 IMPLANT
CANNULA VESSEL 3MM 2 BLNT TIP (CANNULA) ×3 IMPLANT
CATH ROBINSON RED A/P 18FR (CATHETERS) IMPLANT
CLIP VESOCCLUDE MED 6/CT (CLIP) ×3 IMPLANT
CLIP VESOCCLUDE SM WIDE 6/CT (CLIP) ×3 IMPLANT
COVER DOME SNAP 22 D (MISCELLANEOUS) ×3 IMPLANT
COVER PROBE W GEL 5X96 (DRAPES) ×3 IMPLANT
DECANTER SPIKE VIAL GLASS SM (MISCELLANEOUS) IMPLANT
DERMABOND ADVANCED (GAUZE/BANDAGES/DRESSINGS) ×1
DERMABOND ADVANCED .7 DNX12 (GAUZE/BANDAGES/DRESSINGS) ×2 IMPLANT
DRAIN HEMOVAC 1/8 X 5 (WOUND CARE) IMPLANT
DRAPE FEMORAL ANGIO 80X135IN (DRAPES) ×3 IMPLANT
DRAPE INCISE IOBAN 66X45 STRL (DRAPES) ×6 IMPLANT
ELECT REM PT RETURN 9FT ADLT (ELECTROSURGICAL) ×3
ELECTRODE REM PT RTRN 9FT ADLT (ELECTROSURGICAL) ×2 IMPLANT
EVACUATOR SILICONE 100CC (DRAIN) IMPLANT
GLOVE BIO SURGEON STRL SZ7.5 (GLOVE) ×3 IMPLANT
GOWN STRL REUS W/ TWL LRG LVL3 (GOWN DISPOSABLE) ×6 IMPLANT
GOWN STRL REUS W/TWL LRG LVL3 (GOWN DISPOSABLE) ×9
HEMOSTAT SPONGE AVITENE ULTRA (HEMOSTASIS) IMPLANT
INTRODUCER KIT GALT 7CM (INTRODUCER) ×3
KIT BASIN OR (CUSTOM PROCEDURE TRAY) ×3 IMPLANT
KIT ENCORE 26 ADVANTAGE (KITS) ×3 IMPLANT
KIT INTRODUCER GALT 7 (INTRODUCER) ×2 IMPLANT
KIT TURNOVER KIT B (KITS) ×3 IMPLANT
NDL HYPO 25GX1X1/2 BEV (NEEDLE) IMPLANT
NDL PERC 18GX7CM (NEEDLE) ×2 IMPLANT
NEEDLE HYPO 25GX1X1/2 BEV (NEEDLE) IMPLANT
NEEDLE PERC 18GX7CM (NEEDLE) ×3 IMPLANT
NS IRRIG 1000ML POUR BTL (IV SOLUTION) ×6 IMPLANT
PACK CAROTID (CUSTOM PROCEDURE TRAY) ×3 IMPLANT
PACK UNIVERSAL I (CUSTOM PROCEDURE TRAY) ×2 IMPLANT
POSITIONER HEAD DONUT 9IN (MISCELLANEOUS) ×3 IMPLANT
SET MICROPUNCTURE 5F STIFF (MISCELLANEOUS) ×3 IMPLANT
SHEATH AVANTI 11CM 5FR (SHEATH) IMPLANT
SHUNT CAROTID BYPASS 10 (VASCULAR PRODUCTS) IMPLANT
SHUNT CAROTID BYPASS 12FRX15.5 (VASCULAR PRODUCTS) IMPLANT
STENT TRANSCAROTID SYSTEM 8X30 (Permanent Stent) ×1 IMPLANT
STOPCOCK MORSE 400PSI 3WAY (MISCELLANEOUS) ×3 IMPLANT
SUT ETHILON 3 0 PS 1 (SUTURE) IMPLANT
SUT PROLENE 6 0 CC (SUTURE) ×3 IMPLANT
SUT SILK 2 0 PERMA HAND 18 BK (SUTURE) ×3 IMPLANT
SUT SILK 2 0SH CR/8 30 (SUTURE) ×3 IMPLANT
SUT SILK 3 0 TIES 17X18 (SUTURE)
SUT SILK 3-0 18XBRD TIE BLK (SUTURE) IMPLANT
SUT VIC AB 3-0 SH 27 (SUTURE) ×3
SUT VIC AB 3-0 SH 27X BRD (SUTURE) ×2 IMPLANT
SUT VICRYL 4-0 PS2 18IN ABS (SUTURE) ×3 IMPLANT
SYR 10ML LL (SYRINGE) ×9 IMPLANT
SYR 20ML LL LF (SYRINGE) ×3 IMPLANT
SYR 5ML LL (SYRINGE) ×3 IMPLANT
SYR CONTROL 10ML LL (SYRINGE) IMPLANT
TOWEL GREEN STERILE (TOWEL DISPOSABLE) ×3 IMPLANT
TUBING EXTENTION W/L.L. (IV SETS) ×3 IMPLANT
WATER STERILE IRR 1000ML POUR (IV SOLUTION) ×3 IMPLANT
WIRE AMPLATZ SS-J .035X180CM (WIRE) IMPLANT
WIRE BENTSON .035X145CM (WIRE) ×3 IMPLANT

## 2019-12-16 NOTE — Interval H&P Note (Signed)
History and Physical Interval Note:  12/16/2019 7:18 AM  Caleb [name]  has presented today for surgery, with the diagnosis of carotid stenosis.  The various methods of treatment have been discussed with the patient and family. After consideration of risks, benefits and other options for treatment, the patient has consented to  Procedure(s): TRANSCAROTID ARTERY REVASCULARIZATION (Left) as a surgical intervention.  The patient's history has been reviewed, patient examined, no change in status, stable for surgery.  I have reviewed the patient's chart and labs.  Questions were answered to the patient's satisfaction.     Ruta Hinds

## 2019-12-16 NOTE — Anesthesia Procedure Notes (Addendum)
Procedure Name: Intubation Date/Time: 12/16/2019 7:39 AM Performed by: Effie Berkshire, MD Pre-anesthesia Checklist: Patient identified, Emergency Drugs available, Suction available and Patient being monitored Patient Re-evaluated:Patient Re-evaluated prior to induction Oxygen Delivery Method: Circle system utilized Preoxygenation: Pre-oxygenation with 100% oxygen Induction Type: IV induction Ventilation: Mask ventilation without difficulty Laryngoscope Size: Mac and 4 Grade View: Grade II Tube type: Oral Tube size: 7.5 mm Number of attempts: 1 Airway Equipment and Method: Stylet and Oral airway Placement Confirmation: ETT inserted through vocal cords under direct vision,  positive ETCO2 and breath sounds checked- equal and bilateral Secured at: 22 cm Tube secured with: Tape Dental Injury: Teeth and Oropharynx as per pre-operative assessment

## 2019-12-16 NOTE — Telephone Encounter (Signed)
Spoke to patient lower ext doppler results given.Advised to repeat in 6 months.Orders placed.

## 2019-12-16 NOTE — Anesthesia Procedure Notes (Signed)
Arterial Line Insertion Start/End9/27/2021 7:09 AM, 12/16/2019 7:09 AM Performed by: Renato Shin, CRNA, CRNA  Preanesthetic checklist: patient identified, IV checked, site marked, risks and benefits discussed, surgical consent, monitors and equipment checked, pre-op evaluation, timeout performed and anesthesia consent Lidocaine 1% used for infiltration Right, radial was placed Catheter size: 20 G Hand hygiene performed , maximum sterile barriers used  and Seldinger technique used Allen's test indicative of satisfactory collateral circulation Attempts: 1 Procedure performed without using ultrasound guided technique. Following insertion, dressing applied and Biopatch. Post procedure assessment: normal  Patient tolerated the procedure well with no immediate complications.

## 2019-12-16 NOTE — Op Note (Signed)
Procedure: Trans carotid artery revascularization (TCAR) left carotid stent 8 x3 Enroute  Preoperative diagnosis: Asymptomatic left internal carotid artery stenosis  Postoperative diagnosis: Same  Anesthesia: Gen.  Assistant: Arlee Muslim M.D. necessary for expediting procedure and manipulation of wires/catheters  Indications: Patient is an 69 year old male with > 80% left interanal carotid artery with contralateral 80% stenosis  Operative findings: #1 80% left internal carotid artery stenosis stented to residual less than 0% stenosis (8 x 30 mm ENROUTE stent)  #2 right femoral venous access  Operative details: After obtaining informed consent, the patient was taken the operating room. The patient was placed in supine position upper table. After induction of general anesthesia and endotracheal intubation, the patient was inspected with ultrasound on the left neck. At this point the entire left neck and chest were prepped and draped in usual sterile fashion. The left groin was also prepped and draped in usual sterile fashion. A time out was performed. Next a transverse incision was made between the heads of the sternocleidomastoid muscle on the left side of the neck. Incision was carried down through the platysma to the level of the left internal jugular vein. This was reflected laterally. The vagus nerve was identified and protected. Common carotid artery was dissected free circumferentially at the base of the incision. This was elevated up in the operative field with an vessel loop. Approximately 3-4 cm of the artery was dissected free circumferentially to allow adequate exposure. A pursestring suture was placed in the artery at the area we were planning to puncture using a running 5-0 Prolene suture.At this point femoral venous access was established via the right groin. Ultrasound was used to identify the right common femoral vein. An introducer needle was used to cannulate the right common  femoral vein and an 035 Bentsen wire advanced into the vein and the flow reversal sheath placed over this. It was thoroughly flushed with heparinized saline. The patient was given 8,000 units of total heparin to establish an ACT greater than 250. At this point a micropuncture needle was used to cannulate the left common carotid artery. The micropuncture wire was advanced just below the level of the carotid bifurcation. The micropuncture sheath was then advanced over this about 3 cm into the common carotid artery. This was thoroughly flushed with heparinized saline.a contrast angiogram was then performed in AP projection to confirm that we were truly intraluminal with the sheath. This was also used to determine the level of the carotid bifurcation. An additional view was performed at 20 degree LAO 15 deg cranial.  The carotid Amplatz wire was then placed through the micropuncture sheath and the sheath removed. The 8 French flow reversal arterial sheath was then advanced over the guidewire and into the common carotid artery. Sheath was sutured to the skin with 3 2-0 silk sutures.Contrast angiogram was again performed to make sure that we were within the true lumen. This was done in 2 views.  The filter device was switched to the low-flow selection. The flow reversal system was then hooked up to the arterial end of the sheath after everything had been thoroughly flushed and de-aired. Passive flow was used to fill the arterial and of the filter and through the filter and up to the level of the venous sheath. The venous sheath was also fully de-aired and passive flow was established. This was checked by saline infusion in the venous port and noted to flush easily. High flow was then turned on on the filter device. At  this point a TCAR timeout was performed to make sure that the patient's blood pressure was reasonable. It was systolic 175 at this point with a mean of 90. We again confirmed that the ACT was above 250.  The balloon and guidewire insufflator and stent were all selected. These were all prepped. A 5 x 30 mm angioplasty balloon had been selected based on the preoperative CT. Based on the angiogram intraoperatively as well as preoperative CT and 8 x 30 mm Enroute stent was selected. The 014 wire was slightly shaped opening and due to slight curvature of the internal carotid artery. This was advanced with the balloon over it into the common carotid artery and a guidewire used to selectively catheterize the left internal carotid artery.We confirmed that we were within the internal carotid artery by first establishing that the guidewire advanced into the petrous portion of the internal carotid artery and also with contrast angiogram. At this point the 5 x 30 balloon was centered on the lesion and inflated to 8 atm slowly inflating and deflating the balloon. After predilatation, we then brought up the 8 x 30 Enroute stent and advanced and centered this on the lesion.  This was then deployed using a pinch technique after opening the Tuohy Borst valve.  We then gave the patient 2 minutes of additional flow reversal before performing a completion angiogram.  Completion angiogram showed a residual 0% waist.  An angiogram was performed to confirm that the stent was fully deployed after post dilation. There was no dissection and the stent was well apposed.  At this point the guidewire was removed. The flow reversal system was clamped off at the arterial sheath and disconnected. The remaining blood was returned to the patient. This was then disconnected from the venous sheath. The arterial sheath was removed and the pursestring suture cinched down. The patient was given 80 mg of protamine to achieve hemostasis. The venous sheath was pulled and hemostasis obtained with direct pressure. The carotid was inspected found to be without any area of hematoma or active bleeding. The platysma muscle was reapproximated using a running 3-0  Vicryl suture. The skin was closed with a 4-0 Vicryl subcuticular stitch. Dermabond was applied to the incision. Patient was awakened in the operating room and moving her upper and lower extremities symmetrically. She was taken to the recovery room in stable condition.  Ruta Hinds, MD [facility] [place] Office: [phone] Pager: [phone]

## 2019-12-16 NOTE — Transfer of Care (Signed)
Immediate Anesthesia Transfer of Care Note  Patient: Caleb [name]  Procedure(s) Performed: Dorthula Perfect ARTERY REVASCULARIZATION (Left Neck) ULTRASOUND GUIDANCE FOR VASCULAR ACCESS (Right Groin)  Patient Location: PACU  Anesthesia Type:General  Level of Consciousness: awake, alert  and patient cooperative  Airway & Oxygen Therapy: Patient Spontanous Breathing and Patient connected to nasal cannula oxygen  Post-op Assessment: Report given to RN, Post -op Vital signs reviewed and stable, Patient moving all extremities X 4, Patient able to stick tongue midline and on Neo gtt.  Post vital signs: Reviewed and stable  Last Vitals:  Vitals Value Taken Time  BP 86/57 12/16/19 0930  Temp    Pulse 82 12/16/19 0938  Resp 9 12/16/19 0938  SpO2 100 % 12/16/19 0938  Vitals shown include unvalidated device data.  Last Pain:  Vitals:   12/16/19 0607  PainSc: 2       Patients Stated Pain Goal: 3 (53/61/44 3154)  Complications: No complications documented.

## 2019-12-16 NOTE — Anesthesia Postprocedure Evaluation (Signed)
Anesthesia Post Note  Patient: JUJHAR EVERETT  Procedure(s) Performed: Dorthula Perfect ARTERY REVASCULARIZATION (Left Neck) ULTRASOUND GUIDANCE FOR VASCULAR ACCESS (Right Groin)     Patient location during evaluation: PACU Anesthesia Type: General Level of consciousness: awake and alert Pain management: pain level controlled Vital Signs Assessment: post-procedure vital signs reviewed and stable Respiratory status: spontaneous breathing, nonlabored ventilation, respiratory function stable and patient connected to nasal cannula oxygen Cardiovascular status: blood pressure returned to baseline and stable Postop Assessment: no apparent nausea or vomiting Anesthetic complications: no   No complications documented.  Last Vitals:  Vitals:   12/16/19 1130 12/16/19 1145  BP: 101/68 104/80  Pulse: 60 70  Resp: 10 10  Temp:    SpO2: 99% 99%    Last Pain:  Vitals:   12/16/19 1130  PainSc: 0-No pain                 Effie Berkshire

## 2019-12-16 NOTE — Progress Notes (Signed)
  Day of Surgery Note    Subjective:  Seen in PACU. No complaints.   Vitals:   12/16/19 1115 12/16/19 1130  BP: 104/71 101/68  Pulse: 73 60  Resp: 12 10  Temp:    SpO2: 99% 99%   Art line: BP currently 122/67 Neuro: A and O times 4. Face symmetrical. Tongue midline. Incisions:   Left neck incsion without bleeding or hematoma Extremities:  Moves all well Cardiac:  RRR Lungs:  nonlabored  Assessment/Plan:  This is a 69 y.o. male who is s/p left TCAR.  He has had mild hypotension in PACU and has received albumin and NS bolus.  -No 4E when bed ready  Risa Grill, PA-C 12/16/2019 11:45 AM [phone]

## 2019-12-17 ENCOUNTER — Encounter: Payer: Self-pay | Admitting: Podiatry

## 2019-12-17 LAB — CBC
HCT: 31.3 % — ABNORMAL LOW (ref 39.0–52.0)
Hemoglobin: 10 g/dL — ABNORMAL LOW (ref 13.0–17.0)
MCH: 27 pg (ref 26.0–34.0)
MCHC: 31.9 g/dL (ref 30.0–36.0)
MCV: 84.6 fL (ref 80.0–100.0)
Platelets: 289 10*3/uL (ref 150–400)
RBC: 3.7 MIL/uL — ABNORMAL LOW (ref 4.22–5.81)
RDW: 13.5 % (ref 11.5–15.5)
WBC: 5.1 10*3/uL (ref 4.0–10.5)
nRBC: 0 % (ref 0.0–0.2)

## 2019-12-17 LAB — BASIC METABOLIC PANEL
Anion gap: 7 (ref 5–15)
BUN: 14 mg/dL (ref 8–23)
CO2: 25 mmol/L (ref 22–32)
Calcium: 8.7 mg/dL — ABNORMAL LOW (ref 8.9–10.3)
Chloride: 105 mmol/L (ref 98–111)
Creatinine, Ser: 1.07 mg/dL (ref 0.61–1.24)
GFR calc Af Amer: >60 mL/min (ref >60)
GFR calc non Af Amer: >60 mL/min (ref >60)
Glucose, Bld: 174 mg/dL — ABNORMAL HIGH (ref 70–99)
Potassium: 4.2 mmol/L (ref 3.5–5.1)
Sodium: 137 mmol/L (ref 135–145)

## 2019-12-17 LAB — GLUCOSE, CAPILLARY: Glucose-Capillary: 145 mg/dL — ABNORMAL HIGH (ref 70–99)

## 2019-12-17 LAB — POCT ACTIVATED CLOTTING TIME: Activated Clotting Time: 257 seconds

## 2019-12-17 NOTE — Discharge Instructions (Signed)
[facility]  Discharge Instructions   Carotid Surgery  Please refer to the following instructions for your post-procedure care. Your surgeon or physician assistant will discuss any changes with you.  Activity  You are encouraged to walk as much as you can. You can slowly return to normal activities but must avoid strenuous activity and heavy lifting until your doctor tell you it's okay. Avoid activities such as vacuuming or swinging a golf club. You can drive after one week if you are comfortable and you are no longer taking prescription pain medications. It is normal to feel tired for serval weeks after your surgery. It is also normal to have difficulty with sleep habits, eating, and bowel movements after surgery. These will go away with time.  Bathing/Showering  Shower daily after you go home. Do not soak in a bathtub, hot tub, or swim until the incision heals completely.  Incision Care  Shower every day. Clean your incision with mild soap and water. Pat the area dry with a clean towel. You do not need a bandage unless otherwise instructed. Do not apply any ointments or creams to your incision. You may have skin glue on your incision. Do not peel it off. It will come off on its own in about one week. Your incision may feel thickened and raised for several weeks after your surgery. This is normal and the skin will soften over time.   For Men Only: It's okay to shave around the incision but do not shave the incision itself for 2 weeks. It is common to have numbness under your chin that could last for several months.  Diet  Resume your normal diet. There are no special food restrictions following this procedure. A low fat/low cholesterol diet is recommended for all patients with vascular disease. In order to heal from your surgery, it is CRITICAL to get adequate nutrition. Your body requires vitamins, minerals, and protein. Vegetables are the best source of  vitamins and minerals. Vegetables also provide the perfect balance of protein. Processed food has little nutritional value, so try to avoid this.  Medications  Resume taking all of your medications unless your doctor or physician assistant tells you not to. If your incision is causing pain, you may take over-the- counter pain relievers such as acetaminophen (Tylenol). If you were prescribed a stronger pain medication, please be aware these medications can cause nausea and constipation. Prevent nausea by taking the medication with a snack or meal. Avoid constipation by drinking plenty of fluids and eating foods with a high amount of fiber, such as fruits, vegetables, and grains.  Do not take Tylenol if you are taking prescription pain medications.  Restart your Metformin on 12/19/2019 due to receiving IV contrast during your procedure.  Restart your Metoprolol when your systolic blood pressure (top number) is above 120.  Once you have restarted this and your blood pressure is again above 120, then restart your Zestoretic.   Follow Up  Our office will schedule a follow up appointment 2-3 weeks following discharge.  Please call us immediately for any of the following conditions  . Increased pain, redness, drainage (pus) from your incision site. . Fever of 101 degrees or higher. . If you should develop stroke (slurred speech, difficulty swallowing, weakness on one side of your body, loss of vision) you should call 911 and go to the nearest emergency room. .  Reduce your risk of vascular disease:  . Stop smoking. If you would  like help call [facility] at 1-800-QUIT-NOW 7165811504) or [place] at [phone]. . Manage your cholesterol . Maintain a desired weight . Control your diabetes . Keep your blood pressure down .  If you have any questions, please call the office at [phone].

## 2019-12-17 NOTE — Progress Notes (Signed)
Subjective:  Patient ID: Caleb [name], male    DOB: 1950/07/11,  MRN: [id number]  Chief Complaint  Patient presents with  . Routine Post Op  . Diabetic Ulcer    doing well     DOS: 11/21/2019 Procedure: Amputation metatarsophalangeal joint of the left fourth and fifth  69 y.o. male returns for post-op check.  Healing well.  He has been keeping the bandages clean dry and intact.  No acute complaints.  Review of Systems: Negative except as noted in the HPI. Denies N/V/F/Ch.  Past Medical History:  Diagnosis Date  . Aortic stenosis   . Carotid artery occlusion   . Diabetes mellitus without complication ([place])   . Dyslipidemia 09/27/2012  . Erectile dysfunction 09/27/2012  . Heart murmur   . Hypercholesteremia   . Hyperlipidemia 08/12/2012  . Hypertension   . Hyponatremia 08/14/2012  . Neuropathy   . Pancreatitis   . Pancreatitis, acute 09/27/2012  . Peripheral vascular disease ([place])   . Vitamin D deficiency 06/23/2015    Current Outpatient Medications:  .  Acetaminophen-Codeine (TYLENOL/CODEINE #3) 300-30 MG tablet, 1 tablet q4-6h prn postoperatve pain., Disp: 30 tablet, Rfl: 0 .  aspirin EC 81 MG tablet, Take 81 mg by mouth daily., Disp: , Rfl:  .  Blood Glucose Monitoring Suppl (ONETOUCH VERIO) w/Device KIT, 1 each by Does not apply route 2 (two) times daily. Use as instructed twice daily, check fasting sugar in morning and check before bedtime. E11.8, Disp: 1 kit, Rfl: 0 .  clopidogrel (PLAVIX) 75 MG tablet, Take 1 tablet (75 mg total) by mouth daily., Disp: 30 tablet, Rfl: 11 .  collagenase (SANTYL) ointment, Apply 1 application topically daily. Apply Santyl Ointment to scab on left 3rd and 4th digits. Measurements: Left 3rd toe 1.0 x 0.6 cm Left 4th toe: 2.0 x 1.0 cm (Patient taking differently: Apply 1 application topically daily as needed (Scab). Apply Santyl Ointment to scab on left 3rd and 4th digits. Measurements: Left 3rd toe 1.0 x 0.6 cm Left 4th toe: 2.0 x 1.0 cm), Disp:  30 g, Rfl: 0 .  Continuous Blood Gluc Receiver (FREESTYLE LIBRE 14 DAY READER) DEVI, 1 each by Does not apply route See admin instructions. Use as directed to check blood glucose, Disp: 1 each, Rfl: 0 .  Continuous Blood Gluc Sensor (FREESTYLE LIBRE 14 DAY SENSOR) MISC, INJECT IN THE SKIN EVERY 14 (FOURTEEN) DAYS. E11.69 Z79.4, Disp: 6 each, Rfl: 3 .  doxycycline (VIBRA-TABS) 100 MG tablet, Take 1 tablet (100 mg total) by mouth 2 (two) times daily., Disp: 60 tablet, Rfl: 0 .  doxycycline (VIBRA-TABS) 100 MG tablet, Take 1 tablet (100 mg total) by mouth 2 (two) times daily., Disp: 60 tablet, Rfl: 0 .  gabapentin (NEURONTIN) 300 MG capsule, Take 1 capsule (300 mg total) by mouth 2 (two) times daily. (Patient taking differently: Take 300-600 mg by mouth daily as needed (pain). ), Disp: 180 capsule, Rfl: 1 .  glucose blood test strip, Use as instructed twice daily, check fasting sugar in morning and check before bedtime. E11.8, Disp: 100 each, Rfl: 12 .  HUMULIN 70/30 (70-30) 100 UNIT/ML injection, INJECT 37 UNITS INTO THE SKIN 2 TIMES DAILY WITH A MEAL. (Patient taking differently: Inject 37 Units into the skin 2 (two) times daily as needed (high blood sugar (typically once daily)). Under 110 do not take), Disp: 60 mL, Rfl: 6 .  lisinopril-hydrochlorothiazide (ZESTORETIC) 20-12.5 MG tablet, Take 2 tablets by mouth daily., Disp: 180 tablet, Rfl: 1 .  metFORMIN (GLUCOPHAGE) 1000 MG tablet, Take 1 tablet (1,000 mg total) by mouth 2 (two) times daily with a meal. (Patient taking differently: Take 1,000 mg by mouth in the morning and at bedtime. ), Disp: 360 tablet, Rfl: 1 .  metoprolol tartrate (LOPRESSOR) 100 MG tablet, Take 1 tablet (100 mg total) by mouth 2 (two) times daily., Disp: 180 tablet, Rfl: 1 .  ondansetron (ZOFRAN) 4 MG tablet, Take 1 tablet (4 mg total) by mouth every 8 (eight) hours as needed for nausea or vomiting., Disp: 30 tablet, Rfl: 0 .  ONETOUCH DELICA LANCETS 41C MISC, Use as instructed  twice daily, check fasting sugar in morning and check before bedtime. E11.8, Disp: 100 each, Rfl: 12 .  rosuvastatin (CRESTOR) 40 MG tablet, Take 1 tablet (40 mg total) by mouth daily. To lower cholesterol, Disp: 90 tablet, Rfl: 1 .  sildenafil (VIAGRA) 50 MG tablet, TAKE 2 TABLETS BY MOUTH DAILY AS NEEDED FOR ERECTILE DYSFUNCTION (Patient taking differently: Take 50-100 mg by mouth as needed for erectile dysfunction. ), Disp: 20 tablet, Rfl: 1 .  silver sulfADIAZINE (SILVADENE) 1 % cream, Apply to affected toes once daily and cover with dressing. (Patient taking differently: Apply 1 application topically daily as needed (wound care). ), Disp: 400 g, Rfl: 1  Current Facility-Administered Medications:  .  sodium chloride flush (NS) 0.9 % injection 3 mL, 3 mL, Intravenous, Q12H, Lorretta Harp, MD .  sodium chloride flush (NS) 0.9 % injection 3 mL, 3 mL, Intravenous, Q12H, Lorretta Harp, MD  Facility-Administered Medications Ordered in Other Visits:  .  0.9 %  sodium chloride infusion, , Intravenous, Continuous, Iline Oven, Last Rate: 75 mL/hr at 12/17/19 0332, New Bag at 12/17/19 0332 .  alum & mag hydroxide-simeth (MAALOX/MYLANTA) 200-200-20 MG/5ML suspension 15-30 mL, 15-30 mL, Oral, Q2H PRN, Eveland, Matthew, PA-C .  aspirin EC tablet 81 mg, 81 mg, Oral, Daily, Dagoberto Ligas, PA-C, 81 mg at 12/17/19 0804 .  clopidogrel (PLAVIX) tablet 75 mg, 75 mg, Oral, Daily, Dagoberto Ligas, PA-C, 75 mg at 12/17/19 0805 .  docusate sodium (COLACE) capsule 100 mg, 100 mg, Oral, Daily, Dagoberto Ligas, PA-C, 100 mg at 12/17/19 0804 .  gabapentin (NEURONTIN) capsule 300-600 mg, 300-600 mg, Oral, Daily PRN, Eveland, Matthew, PA-C .  guaiFENesin-dextromethorphan (ROBITUSSIN DM) 100-10 MG/5ML syrup 15 mL, 15 mL, Oral, Q4H PRN, Eveland, Matthew, PA-C .  hydrALAZINE (APRESOLINE) injection 5 mg, 5 mg, Intravenous, Q20 Min PRN, Dagoberto Ligas, PA-C .  [START ON 12/18/2019] lisinopril (ZESTRIL)  tablet 40 mg, 40 mg, Oral, Daily **AND** [START ON 12/18/2019] hydrochlorothiazide (HYDRODIURIL) tablet 25 mg, 25 mg, Oral, Daily, Skeet Simmer, RPH .  insulin aspart (novoLOG) injection 0-15 Units, 0-15 Units, Subcutaneous, TID WC, Dagoberto Ligas, PA-C, 2 Units at 12/17/19 [phone] .  labetalol (NORMODYNE) injection 10 mg, 10 mg, Intravenous, Q10 min PRN, Eveland, Matthew, PA-C .  magnesium sulfate IVPB 2 g 50 mL, 2 g, Intravenous, Daily PRN, Dagoberto Ligas, PA-C .  metoprolol tartrate (LOPRESSOR) injection 2-5 mg, 2-5 mg, Intravenous, Q2H PRN, Dagoberto Ligas, PA-C .  metoprolol tartrate (LOPRESSOR) tablet 100 mg, 100 mg, Oral, BID, Dagoberto Ligas, PA-C, 100 mg at 12/17/19 0804 .  morphine 2 MG/ML injection 2 mg, 2 mg, Intravenous, Q2H PRN, Dagoberto Ligas, PA-C, 2 mg at 12/16/19 1330 .  ondansetron (ZOFRAN) injection 4 mg, 4 mg, Intravenous, Q6H PRN, Eveland, Matthew, PA-C .  pantoprazole (PROTONIX) EC tablet 40 mg, 40 mg, Oral, Daily, Eveland, Matthew, PA-C, 40 mg at  12/17/19 0805 .  phenol (CHLORASEPTIC) mouth spray 1 spray, 1 spray, Mouth/Throat, PRN, Eveland, Matthew, PA-C .  potassium chloride SA (KLOR-CON) CR tablet 20-40 mEq, 20-40 mEq, Oral, Daily PRN, Dagoberto Ligas, PA-C .  rosuvastatin (CRESTOR) tablet 40 mg, 40 mg, Oral, Daily, Dagoberto Ligas, PA-C, 40 mg at 12/17/19 0805 .  traMADol (ULTRAM) tablet 50 mg, 50 mg, Oral, Q6H PRN, Dagoberto Ligas, PA-C  Social History   Tobacco Use  Smoking Status Never Smoker  Smokeless Tobacco Never Used    Allergies  Allergen Reactions  . Percocet [Oxycodone-Acetaminophen] Nausea And Vomiting    10/325. Pt has 3 doses of this medication. N/V, temperature 97, no chills and slight sweating. Pt dressing was C/D/I no drainage noted.    Objective:  There were no vitals filed for this visit. There is no height or weight on file to calculate BMI. Constitutional Well developed. Well nourished.  Vascular Foot warm and well  perfused. Capillary refill normal to all digits.   Neurologic Normal speech. Oriented to person, place, and time. Epicritic sensation to light touch grossly present bilaterally.  Dermatologic  skin completely epithelialized.  No signs of dehiscence noted.  No clinical signs of infection noted.  Orthopedic:  Now tenderness to palpation noted about the surgical site.   Radiographs: 3 views of skeletally mature the left foot: Amputation site noted at the interval of MPJ fourth and fifth.  No concern for infection noted. Assessment:   1. Osteomyelitis of fourth toe of left foot ([place])   2. Osteomyelitis of fifth toe of left foot ([place])    Plan:  Patient was evaluated and treated and all questions answered.  S/p foot surgery left -Progressing as expected post-operatively. -XR: See above -WB Status: Weightbearing as tolerated in regular shoes -Sutures: Intact.  No signs of dehiscence noted.  No signs of complication noted. -Medications: None -Clinically patient has healed completely at this time patient is officially discharged from my care.  I encouraged him to get diabetic shoes with a toe filler.  Patient states understanding he will work on that.  No follow-ups on file.

## 2019-12-17 NOTE — Progress Notes (Addendum)
  Progress Note    12/17/2019 7:07 AM 1 Day Post-Op  Subjective:  "I feel good and I'm excited".  Denies trouble swallowing, has voided and walked.   Afebrile HR 50's-80's NSR 254'D-826'E systolic 158% RA  Gtts:  None   Vitals:   12/17/19 0200 12/17/19 0300  BP: 118/79 113/76  Pulse: 66 (!) 50  Resp:    Temp:  98.3 F (36.8 C)  SpO2: 100% 100%     Physical Exam: Neuro:  In tact; moving all extremities equally Lungs:  Non labored Incision:  Clean and dry left neck; right groin soft  CBC    Component Value Date/Time   WBC 5.1 12/17/2019 0354   RBC 3.70 (L) 12/17/2019 0354   HGB 10.0 (L) 12/17/2019 0354   HGB 13.3 11/15/2019 1139   HCT 31.3 (L) 12/17/2019 0354   HCT 40.2 11/15/2019 1139   PLT 289 12/17/2019 0354   PLT 300 11/15/2019 1139   MCV 84.6 12/17/2019 0354   MCV 86 11/15/2019 1139   MCH 27.0 12/17/2019 0354   MCHC 31.9 12/17/2019 0354   RDW 13.5 12/17/2019 0354   RDW 14.5 11/15/2019 1139   LYMPHSABS 3.3 (H) 09/28/2017 1113   MONOABS 0.5 06/09/2015 1705   EOSABS 0.3 09/28/2017 1113   BASOSABS 0.0 09/28/2017 1113    BMET    Component Value Date/Time   NA 137 12/17/2019 0354   NA 138 11/15/2019 1139   K 4.2 12/17/2019 0354   CL 105 12/17/2019 0354   CO2 25 12/17/2019 0354   GLUCOSE 174 (H) 12/17/2019 0354   BUN 14 12/17/2019 0354   BUN 12 11/15/2019 1139   CREATININE 1.07 12/17/2019 0354   CREATININE 0.86 06/06/2016 1455   CALCIUM 8.7 (L) 12/17/2019 0354   GFRNONAA >60 12/17/2019 0354   GFRNONAA 87 04/02/2013 1632   GFRAA >60 12/17/2019 0354   GFRAA >89 04/02/2013 1632     Intake/Output Summary (Last 24 hours) at 12/17/2019 0707 Last data filed at 12/17/2019 0353 Gross per 24 hour  Intake 3190.88 ml  Output 2030 ml  Net 1160.88 ml     Assessment/Plan:  This is a 69 y.o. male who is s/p TCAR (left) 1 Day Post-Op  -pt is doing well this am. -pt neuro exam is in tact -pt has ambulated -pt has voided -discussed with pt to restart  his Metformin in a couple of days due to receiving IV contrast.   -PDMP reviewed.  Pt prescribed Tramadol earlier in the month and he still has 5-6 tablets left.  I talked with him to let him know that is about what we would prescribe and so we won't prescribe narcotics at discharge.  Pt in agreement.  -also discussed with pt to check his BP at home-once his systolic number is > 309, he will restart his Metoprolol.  He will continue checking his BP, once it is again above 120, he will then restart his Zestoretic.  -f/u with Dr. Oneida Alar in 4 weeks with carotid duplex.   Leontine Locket, PA-C [facility] [phone]

## 2019-12-17 NOTE — Progress Notes (Signed)
Discharge instructions provided to patient. IV out. Monitor off CCMD notified. All medications, follow up appointments, and discharge instructions provided. Discharging home with daughter.  Era Bumpers, RN

## 2019-12-17 NOTE — Discharge Summary (Signed)
Discharge Summary     Caleb [name] 03-12-1951 69 y.o. male  [id number]  Admission Date: 12/16/2019  Discharge Date: 12/17/2019  Physician: Elam Dutch, MD  Admission Diagnosis: Carotid artery stenosis [I65.29]   HPI:   This is a 69 y.o. male who recently underwent intervention of his popliteal artery by Dr. Gwenlyn Found.  This should improve perfusion to his left foot and hopefully promote wound healing.  At this point I think we should proceed with left carotid endarterectomy.  He will need interval right carotid endarterectomy.  Tentatively we will schedule the left carotid endarterectomy for December 16, 2019.  This will be pending patient's availability for operation.  We will contact him regarding this.  Hospital Course:  The patient was admitted to the hospital and taken to the operating room on 12/16/2019 and underwent left TCAR.    Findings: #1 80% left internal carotid artery stenosis stented to residual less than 0% stenosis (8 x 30 mm ENROUTE stent)  The pt tolerated the procedure well and was transported to the PACU in good condition.   By POD 1, the pt neuro status was in tact.  Pt voiding, no difficulty swallowing and walking.  His BP soft, and instructions given to pt on restarting BP medications.  Pt discharged home.   The remainder of the hospital course consisted of increasing mobilization and increasing intake of solids without difficulty.   Recent Labs    12/17/19 0354  NA 137  K 4.2  CL 105  CO2 25  GLUCOSE 174*  BUN 14  CALCIUM 8.7*   Recent Labs    12/17/19 0354  WBC 5.1  HGB 10.0*  HCT 31.3*  PLT 289   No results for input(s): INR in the last 72 hours.   Discharge Instructions    Discharge patient   Complete by: As directed    Discharge home once Dr. Oneida Alar has seen the pt.   Discharge disposition: 01-Home or Self Care   Discharge patient date: 12/17/2019      Discharge Diagnosis:  Carotid artery stenosis  [I65.29]  Secondary Diagnosis: Patient Active Problem List   Diagnosis Date Noted  . Carotid artery stenosis 12/16/2019  . Moderate aortic stenosis 12/02/2019  . Critical ischemia of foot 11/18/2019  . Carotid artery disease ([place]) 08/23/2019  . Critical limb ischemia with history of revascularization of same extremity 08/02/2019  . Diabetes mellitus without complication ([place])   . Cardiac murmur 02/22/2018  . Vitamin D deficiency 06/23/2015  . Pancreatitis, acute 09/27/2012  . Dyslipidemia 09/27/2012  . Erectile dysfunction 09/27/2012  . Unspecified essential hypertension 09/27/2012  . Obesity, unspecified 08/14/2012  . Hyponatremia 08/14/2012  . Acute pancreatitis 08/12/2012  . Diabetes mellitus ([place]) 08/12/2012  . Hypertension 08/12/2012  . Hyperlipidemia 08/12/2012  . Metabolic acidosis 39/05/90   Past Medical History:  Diagnosis Date  . Aortic stenosis   . Carotid artery occlusion   . Diabetes mellitus without complication ([place])   . Dyslipidemia 09/27/2012  . Erectile dysfunction 09/27/2012  . Heart murmur   . Hypercholesteremia   . Hyperlipidemia 08/12/2012  . Hypertension   . Hyponatremia 08/14/2012  . Neuropathy   . Pancreatitis   . Pancreatitis, acute 09/27/2012  . Peripheral vascular disease ([place])   . Vitamin D deficiency 06/23/2015    Allergies as of 12/17/2019      Reactions   Percocet [oxycodone-acetaminophen] Nausea And Vomiting   10/325. Pt has 3 doses of this medication. N/V, temperature 97, no chills and  slight sweating. Pt dressing was C/D/I no drainage noted.       Medication List    TAKE these medications   Acetaminophen-Codeine 300-30 MG tablet Commonly known as: TYLENOL/CODEINE #3 1 tablet q4-6h prn postoperatve pain.   aspirin EC 81 MG tablet Take 81 mg by mouth daily.   clopidogrel 75 MG tablet Commonly known as: Plavix Take 1 tablet (75 mg total) by mouth daily.   collagenase ointment Commonly known as: SANTYL Apply 1 application  topically daily. Apply Santyl Ointment to scab on left 3rd and 4th digits. Measurements: Left 3rd toe 1.0 x 0.6 cm Left 4th toe: 2.0 x 1.0 cm What changed:   when to take this  reasons to take this   doxycycline 100 MG tablet Commonly known as: VIBRA-TABS Take 1 tablet (100 mg total) by mouth 2 (two) times daily.   doxycycline 100 MG tablet Commonly known as: VIBRA-TABS Take 1 tablet (100 mg total) by mouth 2 (two) times daily.   FreeStyle Libre 14 Day Reader Kerrin Mo 1 each by Does not apply route See admin instructions. Use as directed to check blood glucose   FreeStyle Libre 14 Day Sensor Misc INJECT IN THE SKIN EVERY 14 (FOURTEEN) DAYS. E11.69 Z79.4   gabapentin 300 MG capsule Commonly known as: NEURONTIN Take 1 capsule (300 mg total) by mouth 2 (two) times daily. What changed:   how much to take  when to take this  reasons to take this   glucose blood test strip Use as instructed twice daily, check fasting sugar in morning and check before bedtime. E11.8   HumuLIN 70/30 (70-30) 100 UNIT/ML injection Generic drug: insulin NPH-regular Human INJECT 37 UNITS INTO THE SKIN 2 TIMES DAILY WITH A MEAL. What changed:   how much to take  how to take this  when to take this  reasons to take this  additional instructions   lisinopril-hydrochlorothiazide 20-12.5 MG tablet Commonly known as: Zestoretic Take 2 tablets by mouth daily.   metFORMIN 1000 MG tablet Commonly known as: Glucophage Take 1 tablet (1,000 mg total) by mouth 2 (two) times daily with a meal. What changed: when to take this   metoprolol tartrate 100 MG tablet Commonly known as: LOPRESSOR Take 1 tablet (100 mg total) by mouth 2 (two) times daily.   ondansetron 4 MG tablet Commonly known as: Zofran Take 1 tablet (4 mg total) by mouth every 8 (eight) hours as needed for nausea or vomiting.   OneTouch Delica Lancets 13K Misc Use as instructed twice daily, check fasting sugar in morning and check  before bedtime. E11.8   OneTouch Verio w/Device Kit 1 each by Does not apply route 2 (two) times daily. Use as instructed twice daily, check fasting sugar in morning and check before bedtime. E11.8   rosuvastatin 40 MG tablet Commonly known as: CRESTOR Take 1 tablet (40 mg total) by mouth daily. To lower cholesterol   sildenafil 50 MG tablet Commonly known as: VIAGRA TAKE 2 TABLETS BY MOUTH DAILY AS NEEDED FOR ERECTILE DYSFUNCTION What changed:   how much to take  how to take this  when to take this  reasons to take this  additional instructions   silver sulfADIAZINE 1 % cream Commonly known as: SILVADENE Apply to affected toes once daily and cover with dressing. What changed:   how much to take  how to take this  when to take this  reasons to take this  additional instructions  [facility] Discharge Instructions Carotid Endarterectomy (CEA)  Please refer to the following instructions for your post-procedure care. Your surgeon or physician assistant will discuss any changes with you.  Activity  You are encouraged to walk as much as you can. You can slowly return to normal activities but must avoid strenuous activity and heavy lifting until your doctor tell you it's OK. Avoid activities such as vacuuming or swinging a golf club. You can drive after one week if you are comfortable and you are no longer taking prescription pain medications. It is normal to feel tired for serval weeks after your surgery. It is also normal to have difficulty with sleep habits, eating, and bowel movements after surgery. These will go away with time.  Bathing/Showering  You may shower after you come home. Do not soak in a bathtub, hot tub, or swim until the incision heals completely.  Incision Care  Shower every day. Clean your incision with mild soap and water. Pat the area dry with a clean towel. You do not need a bandage unless otherwise  instructed. Do not apply any ointments or creams to your incision. You may have skin glue on your incision. Do not peel it off. It will come off on its own in about one week. Your incision may feel thickened and raised for several weeks after your surgery. This is normal and the skin will soften over time. For Men Only: It's OK to shave around the incision but do not shave the incision itself for 2 weeks. It is common to have numbness under your chin that could last for several months.  Diet  Resume your normal diet. There are no special food restrictions following this procedure. A low fat/low cholesterol diet is recommended for all patients with vascular disease. In order to heal from your surgery, it is CRITICAL to get adequate nutrition. Your body requires vitamins, minerals, and protein. Vegetables are the best source of vitamins and minerals. Vegetables also provide the perfect balance of protein. Processed food has little nutritional value, so try to avoid this.  Medications  Resume taking all of your medications unless your doctor or physician assistant tells you not to.  If your incision is causing pain, you may take over-the- counter pain relievers such as acetaminophen (Tylenol). If you were prescribed a stronger pain medication, please be aware these medications can cause nausea and constipation.  Prevent nausea by taking the medication with a snack or meal. Avoid constipation by drinking plenty of fluids and eating foods with a high amount of fiber, such as fruits, vegetables, and grains.  Do not take Tylenol if you are taking prescription pain medications.  Restart your Metformin on 12/19/2019 due to receiving IV contrast during your procedure.  Restart your Metoprolol when your systolic blood pressure (top number) is above 120.  Once you have restarted this and your blood pressure is again above 120, then restart your Zestoretic.    Follow Up  Our office will schedule a follow up  appointment 2-3 weeks following discharge.  Please call us immediately for any of the following conditions  . Increased pain, redness, drainage (pus) from your incision site. . Fever of 101 degrees or higher. . If you should develop stroke (slurred speech, difficulty swallowing, weakness on one side of your body, loss of vision) you should call 911 and go to the nearest emergency room. .  Reduce your risk of vascular disease:  . Stop smoking. If you would  like help call [facility] at 1-800-QUIT-NOW 867-009-4924) or [place] at (425)182-7820. . Manage your cholesterol . Maintain a desired weight . Control your diabetes . Keep your blood pressure down .  If you have any questions, please call the office at [phone].  Prescriptions given: 1.  No narcotics prescribed.  Pt has Tramadol at home  Disposition: home  Patient's condition: is Good  Follow up: 1. Dr. Oneida Alar in 4 weeks with carotid duplex.   Leontine Locket, PA-C [facility] [phone]   --- For [facility] use ---   Modified Rankin score at D/C (0-6): 0   IV medication needed for:  1. Hypertension: No 2. Hypotension: No  Post-op Complications: No  1. Post-op CVA or TIA: No  If yes: Event classification (right eye, left eye, right cortical, left cortical, verterobasilar, other): n/a  If yes: Timing of event (intra-op, <6 hrs post-op, >=6 hrs post-op, unknown): n/a  2. CN injury: No  If yes: CN n/a injuried   3. Myocardial infarction: No  If yes: Dx by (EKG or clinical, Troponin): n/a  4.  CHF: No  5.  Dysrhythmia (new): No  6. Wound infection: No  7. Reperfusion symptoms: No  8. Return to OR: No  If yes: return to OR for (bleeding, neurologic, other CEA incision, other): n/a  Discharge medications: Statin use:  Yes ASA use:  Yes   Beta blocker use:  Yes ACE-Inhibitor use:  Yes  ARB use:  No CCB use: No P2Y12 Antagonist use: Yes, [x ] Plavix, [ ] Plasugrel,  [ ] Ticlopinine, [ ] Ticagrelor, [ ] Other, [ ] No for medical reason, [ ] Non-compliant, [ ] Not-indicated Anti-coagulant use:  No, [ ] Warfarin, [ ] Rivaroxaban, [ ] Dabigatran,

## 2019-12-18 ENCOUNTER — Telehealth: Payer: Self-pay

## 2019-12-18 NOTE — Telephone Encounter (Signed)
Transition Care Management Follow-up Telephone Call  Date of discharge and from where: 12/17/2019, [facility]   How have you been since you were released from the hospital? He said he is doing very well.  He said he can feel the positive difference from his surgery. He said that he is anxious to go back to work and go back to the gym and swim. 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   Any questions or concerns?  none at this time   Items Reviewed:  Did the pt receive and understand the discharge instructions provided?  yes  Medications obtained and verified?  he said that he has all medications and did not have any questions about his med regime. He correctly stated the orders to hold metformin and restart it 12/19/2019. He also correctly stated the orders to restart metoprolol and zestoretic. He said that he has not checked his BP this morning but will check it today. He also noted that he needs to check his medications to see what needs to be refilled.   Any new allergies since your discharge?  none reported   Do you have support at home?  yes, he lives with one of his daughters and has another one visiting him   Has Freestyle Libre  Has home BP monitor  No home health or DME ordered  He reported that there are no open wounds on his left  foot, no dressing changes needed.  Functional Questionnaire: (I = Independent and D = Dependent) ADLs: independent  Follow up appointments reviewed:   PCP Hospital f/u appt confirmed?Dr Margarita Rana 12/31/2019  [facility] f/u appt confirmed? Dr Oneida Alar - 01/16/2020, [facility] - 01/03/2020; he said he will call to cancel the appointment with Dr Posey Pronto because he no longer needs to see him   Are transportation arrangements needed? no  If their condition worsens, is the pt aware to call PCP or go to the Emergency Dept.?  yes  Was the patient provided with contact information for the PCP's office or ED?  he  has the clinic phone number  Was to pt encouraged to call back with questions or concerns?  yes

## 2019-12-18 NOTE — Telephone Encounter (Signed)
From the discharge call. He has appointment with Dr Eloisa Northern 12/31/2019    He said he is doing very well.  He said he can feel the positive difference from his surgery. He said that he is anxious to go back to work and go back to the gym and swim. 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     he said that he has all medications and did not have any questions about his med regime. He correctly stated the orders to hold metformin and restart it 12/19/2019. He also correctly stated the orders to restart metoprolol and zestoretic. He said that he has not checked his BP this morning but will check it today. He also noted that he needs to check his medications to see what needs to be refilled.    He reported that there are no open wounds on his left  foot, no dressing changes needed.

## 2019-12-19 ENCOUNTER — Other Ambulatory Visit: Payer: Self-pay | Admitting: Pharmacist

## 2019-12-19 MED ORDER — NOVOLIN 70/30 (70-30) 100 UNIT/ML ~~LOC~~ SUSP: SUBCUTANEOUS | 2 refills | Status: DC

## 2019-12-19 MED FILL — GABAPENTIN 300 MG CAPSULE: 300 | 30 days supply | Qty: 60 | Fill #2

## 2019-12-19 MED FILL — METFORMIN HCL 1000 MG TABS: 1000 | 30 days supply | Qty: 60 | Fill #2

## 2019-12-19 MED FILL — METOPROLOL TARTRATE 100 MG: 100 | 30 days supply | Qty: 60 | Fill #1

## 2019-12-19 NOTE — Progress Notes (Signed)
Sending change from Humulin to Novolin 70/30 per insurance preference.

## 2019-12-20 ENCOUNTER — Ambulatory Visit (INDEPENDENT_AMBULATORY_CARE_PROVIDER_SITE_OTHER): Payer: Medicare HMO | Admitting: Podiatry

## 2019-12-20 ENCOUNTER — Other Ambulatory Visit: Payer: Self-pay

## 2019-12-20 ENCOUNTER — Encounter: Payer: Self-pay | Admitting: Podiatry

## 2019-12-20 DIAGNOSIS — M869 Osteomyelitis, unspecified: Secondary | ICD-10-CM

## 2019-12-20 NOTE — Progress Notes (Signed)
This appointment was meant to be canceled.  Patient has been discharged from my care.  I educated him that if any foot and ankle issues arise in the future he can come back and see me.  Patient states understanding

## 2019-12-24 ENCOUNTER — Other Ambulatory Visit: Payer: Self-pay | Admitting: *Deleted

## 2019-12-24 DIAGNOSIS — I6523 Occlusion and stenosis of bilateral carotid arteries: Secondary | ICD-10-CM

## 2019-12-31 ENCOUNTER — Encounter: Payer: Self-pay | Admitting: Family Medicine

## 2019-12-31 ENCOUNTER — Ambulatory Visit: Payer: Medicare HMO | Attending: Family Medicine | Admitting: Family Medicine

## 2019-12-31 ENCOUNTER — Other Ambulatory Visit: Payer: Self-pay | Admitting: Pharmacist

## 2019-12-31 ENCOUNTER — Other Ambulatory Visit: Payer: Self-pay

## 2019-12-31 VITALS — BP 133/82 | HR 73 | Ht 68.0 in | Wt 177.0 lb

## 2019-12-31 DIAGNOSIS — Z794 Long term (current) use of insulin: Secondary | ICD-10-CM | POA: Diagnosis not present

## 2019-12-31 DIAGNOSIS — N529 Male erectile dysfunction, unspecified: Secondary | ICD-10-CM | POA: Diagnosis not present

## 2019-12-31 DIAGNOSIS — E1169 Type 2 diabetes mellitus with other specified complication: Secondary | ICD-10-CM | POA: Diagnosis not present

## 2019-12-31 DIAGNOSIS — I779 Disorder of arteries and arterioles, unspecified: Secondary | ICD-10-CM

## 2019-12-31 DIAGNOSIS — I739 Peripheral vascular disease, unspecified: Secondary | ICD-10-CM | POA: Insufficient documentation

## 2019-12-31 DIAGNOSIS — E7800 Pure hypercholesterolemia, unspecified: Secondary | ICD-10-CM | POA: Diagnosis not present

## 2019-12-31 DIAGNOSIS — I10 Essential (primary) hypertension: Secondary | ICD-10-CM | POA: Diagnosis not present

## 2019-12-31 DIAGNOSIS — R269 Unspecified abnormalities of gait and mobility: Secondary | ICD-10-CM

## 2019-12-31 LAB — GLUCOSE, POCT (MANUAL RESULT ENTRY): POC Glucose: 143 mg/dl — AB (ref 70–99)

## 2019-12-31 MED ORDER — "INSULIN SYRINGE-NEEDLE U-100 31G X 5/16"" 0.3 ML MISC": 2 refills | Status: DC

## 2019-12-31 MED ORDER — METFORMIN HCL 1000 MG PO TABS: 1000.0000 mg | ORAL_TABLET | Freq: Two times a day (BID) | ORAL | 1 refills | Status: DC

## 2019-12-31 MED ORDER — ROSUVASTATIN CALCIUM 40 MG PO TABS: 40.0000 mg | ORAL_TABLET | Freq: Every day | ORAL | 1 refills | Status: DC

## 2019-12-31 MED ORDER — METOPROLOL TARTRATE 100 MG PO TABS: 100.0000 mg | ORAL_TABLET | Freq: Two times a day (BID) | ORAL | 1 refills | Status: DC

## 2019-12-31 MED ORDER — DAPAGLIFLOZIN PROPANEDIOL 5 MG PO TABS: 5.0000 mg | ORAL_TABLET | Freq: Every day | ORAL | 1 refills | Status: DC

## 2019-12-31 MED ORDER — GABAPENTIN 300 MG PO CAPS: 300.0000 mg | ORAL_CAPSULE | Freq: Two times a day (BID) | ORAL | 1 refills | Status: DC

## 2019-12-31 MED ORDER — SILDENAFIL CITRATE 50 MG PO TABS: ORAL_TABLET | ORAL | 1 refills | Status: DC

## 2019-12-31 MED ORDER — NOVOLIN 70/30 (70-30) 100 UNIT/ML ~~LOC~~ SUSP: 10.0000 [IU] | Freq: Two times a day (BID) | SUBCUTANEOUS | 26 refills | Status: DC

## 2019-12-31 MED FILL — SILDENAFIL CITRATE 50 MG TA: 50 | 23 days supply | Qty: 6 | Fill #0

## 2019-12-31 MED FILL — TRUEPLUS SYR 0.5ML 31GX5/16: 31G X 5/16" | 25 days supply | Qty: 100 | Fill #0

## 2019-12-31 MED FILL — ROSUVASTATIN CALCIUM 40 MG: 40 | 30 days supply | Qty: 30 | Fill #0

## 2019-12-31 MED FILL — FARXIGA 5 MG TABLET: 5 | 30 days supply | Qty: 30 | Fill #0

## 2019-12-31 NOTE — Progress Notes (Signed)
Has paperwork for [facility] to be filed out.  Has had the vaccine but does not have card.

## 2019-12-31 NOTE — Progress Notes (Signed)
Subjective:  Patient ID: Caleb [name], male    DOB: [birth year]  Age: 69 y.o. MRN: [id number]  CC: Diabetes   HPI KALIJAH ZEISS is a 69 year old male with a history of type 2 diabetes mellitus (A1c7.6), hypertension, hyperlipidemia, s/p L 4th and 5th  toe metatarsal amputation, PAD status post b/l popliteal artery intervention bilateral carotid artery stenosis status post L transcarotid artery revascularization on 11/2019, CHF (EF 40 to 45% from echo of 08/2019, grade 1 DD, LVH, LV global hypokinesis, moderate to severe aortic valve stenosis) who presents today for a follow-up at the transitional care clinic. He was hospitalized from 12/16/2019 through 12/17/2019 for his recent left transcarotid artery revascularization with uneventful postop course. His postop visit comes up later in the month with his vascular surgeon.  He feels fine today and ambulates with the aid of a walker because he has an unstable gait and problems with his balance due to his amputation.  He lives with his daughter and does have his grand child who helps him with his shower and his meals. [facility] and is needing a form completed stating he needs help with his ADLs.  With regards to his diabetes mellitus he has not been taking NovoLog 70/30, 37 units twice daily as prescribed but rather he has been administering 37 units once daily and Metformin once daily, alternating his medications depending on his blood sugars to prevent hypoglycemia as he has had some low blood sugar readings in the mornings. Past Medical History:  Diagnosis Date  . Aortic stenosis   . Carotid artery occlusion   . Diabetes mellitus without complication ([place])   . Dyslipidemia 09/27/2012  . Erectile dysfunction 09/27/2012  . Heart murmur   . Hypercholesteremia   . Hyperlipidemia 08/12/2012  . Hypertension   . Hyponatremia 08/14/2012  . Neuropathy   . Pancreatitis   . Pancreatitis, acute 09/27/2012  .  Peripheral vascular disease ([place])   . Vitamin D deficiency 06/23/2015    Past Surgical History:  Procedure Laterality Date  . ABDOMINAL AORTOGRAM W/LOWER EXTREMITY N/A 08/08/2019   Procedure: ABDOMINAL AORTOGRAM W/LOWER EXTREMITY;  Surgeon: Lorretta Harp, MD;  Location: [facility];  Service: Cardiovascular;  Laterality: N/A;  . ABDOMINAL AORTOGRAM W/LOWER EXTREMITY N/A 11/18/2019   Procedure: ABDOMINAL AORTOGRAM W/LOWER EXTREMITY;  Surgeon: Lorretta Harp, MD;  Location: [facility];  Service: Cardiovascular;  Laterality: N/A;  . COLONOSCOPY  12 years ago   in [place] clinic= normal exam per pt  . KNEE SURGERY    . PERIPHERAL VASCULAR INTERVENTION Right 08/08/2019   Procedure: PERIPHERAL VASCULAR INTERVENTION;  Surgeon: Lorretta Harp, MD;  Location: [facility];  Service: Cardiovascular;  Laterality: Right;  . PERIPHERAL VASCULAR INTERVENTION Left 11/18/2019   Procedure: PERIPHERAL VASCULAR INTERVENTION;  Surgeon: Lorretta Harp, MD;  Location: [facility];  Service: Cardiovascular;  Laterality: Left;  left popliteal artery  . TRANSCAROTID ARTERY REVASCULARIZATION Left 12/16/2019   Procedure: TRANSCAROTID ARTERY REVASCULARIZATION;  Surgeon: Elam Dutch, MD;  Location: [facility];  Service: Vascular;  Laterality: Left;  . ULTRASOUND GUIDANCE FOR VASCULAR ACCESS Right 12/16/2019   Procedure: ULTRASOUND GUIDANCE FOR VASCULAR ACCESS;  Surgeon: Elam Dutch, MD;  [facility];  Service: Vascular;  Laterality: Right;    Family History  Problem Relation Age of Onset  . CAD Father   . Hypertension Father   . Alcohol abuse Father  Cause of death  . Diabetes Mother   . Colon polyps Mother   . CAD Brother 16       CABG  . Colon cancer Neg Hx   . Esophageal cancer Neg Hx   . Rectal cancer Neg Hx   . Stomach cancer Neg Hx     Allergies  Allergen Reactions  . Percocet [Oxycodone-Acetaminophen] Nausea And Vomiting    10/325. Pt has 3 doses of  this medication. N/V, temperature 97, no chills and slight sweating. Pt dressing was C/D/I no drainage noted.     Outpatient Medications Prior to Visit  Medication Sig Dispense Refill  . Acetaminophen-Codeine (TYLENOL/CODEINE #3) 300-30 MG tablet 1 tablet q4-6h prn postoperatve pain. 30 tablet 0  . aspirin EC 81 MG tablet Take 81 mg by mouth daily.    . Blood Glucose Monitoring Suppl (ONETOUCH VERIO) w/Device KIT 1 each by Does not apply route 2 (two) times daily. Use as instructed twice daily, check fasting sugar in morning and check before bedtime. E11.8 1 kit 0  . clopidogrel (PLAVIX) 75 MG tablet Take 1 tablet (75 mg total) by mouth daily. 30 tablet 11  . collagenase (SANTYL) ointment Apply 1 application topically daily. Apply Santyl Ointment to scab on left 3rd and 4th digits. Measurements: Left 3rd toe 1.0 x 0.6 cm Left 4th toe: 2.0 x 1.0 cm (Patient taking differently: Apply 1 application topically daily as needed (Scab). Apply Santyl Ointment to scab on left 3rd and 4th digits. Measurements: Left 3rd toe 1.0 x 0.6 cm Left 4th toe: 2.0 x 1.0 cm) 30 g 0  . Continuous Blood Gluc Receiver (FREESTYLE LIBRE 14 DAY READER) DEVI 1 each by Does not apply route See admin instructions. Use as directed to check blood glucose 1 each 0  . Continuous Blood Gluc Sensor (FREESTYLE LIBRE 14 DAY SENSOR) MISC INJECT IN THE SKIN EVERY 14 (FOURTEEN) DAYS. E11.69 Z79.4 6 each 3  . doxycycline (VIBRA-TABS) 100 MG tablet Take 1 tablet (100 mg total) by mouth 2 (two) times daily. 60 tablet 0  . doxycycline (VIBRA-TABS) 100 MG tablet Take 1 tablet (100 mg total) by mouth 2 (two) times daily. 60 tablet 0  . glucose blood test strip Use as instructed twice daily, check fasting sugar in morning and check before bedtime. E11.8 100 each 12  . lisinopril-hydrochlorothiazide (ZESTORETIC) 20-12.5 MG tablet Take 2 tablets by mouth daily. 180 tablet 1  . ondansetron (ZOFRAN) 4 MG tablet Take 1 tablet (4 mg total) by mouth every  8 (eight) hours as needed for nausea or vomiting. 30 tablet 0  . ONETOUCH DELICA LANCETS 52W MISC Use as instructed twice daily, check fasting sugar in morning and check before bedtime. E11.8 100 each 12  . silver sulfADIAZINE (SILVADENE) 1 % cream Apply to affected toes once daily and cover with dressing. (Patient taking differently: Apply 1 application topically daily as needed (wound care). ) 400 g 1  . gabapentin (NEURONTIN) 300 MG capsule Take 1 capsule (300 mg total) by mouth 2 (two) times daily. (Patient taking differently: Take 300-600 mg by mouth daily as needed (pain). ) 180 capsule 1  . insulin NPH-regular Human (NOVOLIN 70/30) (70-30) 100 UNIT/ML injection INJECT 37 UNITS INTO THE SKIN 2 TIMES DAILY WITH A MEAL. 20 mL 2  . metFORMIN (GLUCOPHAGE) 1000 MG tablet Take 1 tablet (1,000 mg total) by mouth 2 (two) times daily with a meal. (Patient taking differently: Take 1,000 mg by mouth in the morning and at  bedtime. ) 360 tablet 1  . metoprolol tartrate (LOPRESSOR) 100 MG tablet Take 1 tablet (100 mg total) by mouth 2 (two) times daily. 180 tablet 1  . rosuvastatin (CRESTOR) 40 MG tablet Take 1 tablet (40 mg total) by mouth daily. To lower cholesterol 90 tablet 1  . sildenafil (VIAGRA) 50 MG tablet TAKE 2 TABLETS BY MOUTH DAILY AS NEEDED FOR ERECTILE DYSFUNCTION (Patient taking differently: Take 50-100 mg by mouth as needed for erectile dysfunction. ) 20 tablet 1   Facility-Administered Medications Prior to Visit  Medication Dose Route Frequency Provider Last Rate Last Admin  . sodium chloride flush (NS) 0.9 % injection 3 mL  3 mL Intravenous Q12H Lorretta Harp, MD      . sodium chloride flush (NS) 0.9 % injection 3 mL  3 mL Intravenous Q12H Lorretta Harp, MD         ROS Review of Systems  Constitutional: Negative for activity change and appetite change.  HENT: Negative for sinus pressure and sore throat.   Eyes: Negative for visual disturbance.  Respiratory: Negative for  cough, chest tightness and shortness of breath.   Cardiovascular: Negative for chest pain and leg swelling.  Gastrointestinal: Negative for abdominal distention, abdominal pain, constipation and diarrhea.  Endocrine: Negative.   Genitourinary: Negative for dysuria.  Musculoskeletal: Positive for gait problem. Negative for joint swelling and myalgias.  Skin: Negative for rash.  Allergic/Immunologic: Negative.   Neurological: Negative for weakness, light-headedness and numbness.  Psychiatric/Behavioral: Negative for dysphoric mood and suicidal ideas.    Objective:  BP 133/82   Pulse 73   Ht 5' 8" (1.727 m)   Wt 177 lb (80.3 kg)   SpO2 100%   BMI 26.91 kg/m   BP/Weight 12/31/2019 12/17/2019 8/78/6767  Systolic BP 209 470 -  Diastolic BP 82 72 -  Wt. (Lbs) 177 - 177.69  BMI 26.91 - 27.02      Physical Exam Constitutional:      Appearance: He is well-developed.  Neck:     Vascular: No JVD.     Comments: Left neck surgical scar Cardiovascular:     Rate and Rhythm: Normal rate.     Heart sounds: Murmur heard.      Comments: Unable to palpate bilateral dorsalis pedis, bilateral posterior tibialis Pulmonary:     Effort: Pulmonary effort is normal.     Breath sounds: Normal breath sounds. No wheezing or rales.  Chest:     Chest wall: No tenderness.  Abdominal:     General: Bowel sounds are normal. There is no distension.     Palpations: Abdomen is soft. There is no mass.     Tenderness: There is no abdominal tenderness.  Musculoskeletal:        General: Normal range of motion.     Right lower leg: No edema.     Left lower leg: No edema.  Neurological:     Mental Status: He is alert and oriented to person, place, and time.  Psychiatric:        Mood and Affect: Mood normal.     CMP Latest Ref Rng & Units 12/17/2019 12/11/2019 11/19/2019  Glucose 70 - 99 mg/dL 174(H) 142(H) 175(H)  BUN 8 - 23 mg/dL _0 Creatinine 0.61 - 1.24 mg/dL 1.07 1.01 1.10  Sodium 135 - 145  mmol/L 137 138 136  Potassium 3.5 - 5.1 mmol/L 4.2 3.8 4.0  Chloride 98 - 111 mmol/L 105 100 102  CO2 22 -  32 mmol/L _0 Calcium 8.9 - 10.3 mg/dL 8.7(L) 9.2 8.9  Total Protein 6.5 - 8.1 g/dL - 8.1 -  Total Bilirubin 0.3 - 1.2 mg/dL - 0.1(L) -  Alkaline Phos 38 - 126 U/L - 84 -  AST 15 - 41 U/L - 25 -  ALT 0 - 44 U/L - 31 -    Lipid Panel     Component Value Date/Time   CHOL 129 01/15/2019 0945   TRIG 115 01/15/2019 0945   HDL 47 01/15/2019 0945   CHOLHDL 2.7 01/15/2019 0945   CHOLHDL 3.9 04/02/2013 1632   VLDL 66 (H) 04/02/2013 1632   LDLCALC 61 01/15/2019 0945    CBC    Component Value Date/Time   WBC 5.1 12/17/2019 0354   RBC 3.70 (L) 12/17/2019 0354   HGB 10.0 (L) 12/17/2019 0354   HGB 13.3 11/15/2019 1139   HCT 31.3 (L) 12/17/2019 0354   HCT 40.2 11/15/2019 1139   PLT 289 12/17/2019 0354   PLT 300 11/15/2019 1139   MCV 84.6 12/17/2019 0354   MCV 86 11/15/2019 1139   MCH 27.0 12/17/2019 0354   MCHC 31.9 12/17/2019 0354   RDW 13.5 12/17/2019 0354   RDW 14.5 11/15/2019 1139   LYMPHSABS 3.3 (H) 09/28/2017 1113   MONOABS 0.5 06/09/2015 1705   EOSABS 0.3 09/28/2017 1113   BASOSABS 0.0 09/28/2017 1113    Lab Results  Component Value Date   HGBA1C 7.6 (H) 11/18/2019    Assessment & Plan:  1. Type 2 diabetes mellitus with other specified complication, with long-term current use of insulin (HCC) Suboptimally controlled with A1c of 7.6 Farxiga added to regimen He has been noncompliant with his insulin regimen due to fear of hypoglycemia NovoLog 70/30 regimen reduced from 37 units twice daily to 10 units twice daily and he has been advised to uptitrate by 2 units every fourth day until blood sugars are at goal He will follow up with the clinical pharmacist to ensure blood sugars are trending in the right direction and for titration of medication Counseled on Diabetic diet, my plate method, 174 minutes of moderate intensity exercise/week Blood sugar logs with  fasting goals of 80-120 mg/dl, random of less than 180 and in the event of sugars less than 60 mg/dl or greater than 400 mg/dl encouraged to notify the clinic. Advised on the need for annual eye exams, annual foot exams, Pneumonia vaccine. - POCT glucose (manual entry) - dapagliflozin propanediol (FARXIGA) 5 MG TABS tablet; Take 1 tablet (5 mg total) by mouth daily before breakfast.  Dispense: 90 tablet; Refill: 1 - metFORMIN (GLUCOPHAGE) 1000 MG tablet; Take 1 tablet (1,000 mg total) by mouth 2 (two) times daily with a meal.  Dispense: 360 tablet; Refill: 1 - insulin NPH-regular Human (NOVOLIN 70/30) (70-30) 100 UNIT/ML injection; Inject 10 Units into the skin 2 (two) times daily with a meal. Titrate by 2 units twice daily every fourth day until blood sugars are at goal.  Maximum of 20 units twice daily  Dispense: 20 mL; Refill: 26 - gabapentin (NEURONTIN) 300 MG capsule; Take 1 capsule (300 mg total) by mouth 2 (two) times daily.  Dispense: 180 capsule; Refill: 1  2. Gait abnormality High risk for falls Secondary to amputation Ambulating with a walker I have completed his [facility] paperwork  3. Pure hypercholesterolemia Stable Low-cholesterol diet - rosuvastatin (CRESTOR) 40 MG tablet; Take 1 tablet (40 mg total) by mouth daily. To lower cholesterol  Dispense: 90 tablet; Refill:  1  4. Essential hypertension Controlled Counseled on blood pressure goal of less than 130/80, low-sodium, DASH diet, medication compliance, 150 minutes of moderate intensity exercise per week. Discussed medication compliance, adverse effects. - metoprolol tartrate (LOPRESSOR) 100 MG tablet; Take 1 tablet (100 mg total) by mouth 2 (two) times daily.  Dispense: 180 tablet; Refill: 1  5. Erectile dysfunction, unspecified erectile dysfunction type - sildenafil (VIAGRA) 50 MG tablet; TAKE 2 TABLETS BY MOUTH DAILY AS NEEDED FOR ERECTILE DYSFUNCTION  Dispense: 20 tablet; Refill: 1  6. Bilateral carotid artery disease,  unspecified type ([place]) Status post left transcarotid intervention Will be scheduled for right transcarotid intervention Follow-up with vascular  7. Peripheral vascular disease ([place]) Status post bilateral popliteal intervention Follow-up with vascular   Meds ordered this encounter  Medications  . dapagliflozin propanediol (FARXIGA) 5 MG TABS tablet    Sig: Take 1 tablet (5 mg total) by mouth daily before breakfast.    Dispense:  90 tablet    Refill:  1  . sildenafil (VIAGRA) 50 MG tablet    Sig: TAKE 2 TABLETS BY MOUTH DAILY AS NEEDED FOR ERECTILE DYSFUNCTION    Dispense:  20 tablet    Refill:  1  . rosuvastatin (CRESTOR) 40 MG tablet    Sig: Take 1 tablet (40 mg total) by mouth daily. To lower cholesterol    Dispense:  90 tablet    Refill:  1  . metoprolol tartrate (LOPRESSOR) 100 MG tablet    Sig: Take 1 tablet (100 mg total) by mouth 2 (two) times daily.    Dispense:  180 tablet    Refill:  1  . metFORMIN (GLUCOPHAGE) 1000 MG tablet    Sig: Take 1 tablet (1,000 mg total) by mouth 2 (two) times daily with a meal.    Dispense:  360 tablet    Refill:  1  . insulin NPH-regular Human (NOVOLIN 70/30) (70-30) 100 UNIT/ML injection    Sig: Inject 10 Units into the skin 2 (two) times daily with a meal. Titrate by 2 units twice daily every fourth day until blood sugars are at goal.  Maximum of 20 units twice daily    Dispense:  20 mL    Refill:  26  . gabapentin (NEURONTIN) 300 MG capsule    Sig: Take 1 capsule (300 mg total) by mouth 2 (two) times daily.    Dispense:  180 capsule    Refill:  1    Follow-up: Return in about 2 years (around 12/30/2021) for [facility] - blood glucose log review; PCP - 3 months.       [name], MD, FAAFP. [facility] and [place], [place]   12/31/2019, 1:11 PM

## 2019-12-31 NOTE — Patient Instructions (Signed)
Dapagliflozin tablets What is this medicine? DAPAGLIFLOZIN (DAP a gli FLOE zin) controls blood sugar in people with diabetes. It is used with lifestyle changes like diet and exercise. It also treats heart failure. It may lower the need for treatment of heart failure in the hospital. This medicine may be used for other purposes; ask your health care provider or pharmacist if you have questions. COMMON BRAND NAME(S): Farxiga What should I tell my health care provider before I take this medicine? They need to know if you have any of these conditions:  dehydration  diabetic ketoacidosis  diet low in salt  eating less due to illness, surgery, dieting, or any other reason  having surgery  history of pancreatitis or pancreas problems  history of yeast infection of the penis or vagina  if you often drink alcohol  infections in the bladder, kidneys, or urinary tract  kidney disease  low blood pressure  on hemodialysis  problems urinating  type 1 diabetes  uncircumcised male  an unusual or allergic reaction to dapagliflozin, other medicines, foods, dyes, or preservatives  pregnant or trying to get pregnant  breast-feeding How should I use this medicine? Take this medicine by mouth with a glass of water. Follow the directions on the prescription label. You can take it with or without food. If it upsets your stomach, take it with food. Take this medicine in the morning. Take your dose at the same time each day. Do not take more often than directed. Do not stop taking except on your doctor's advice. A special MedGuide will be given to you by the pharmacist with each prescription and refill. Be sure to read this information carefully each time. Talk to your pediatrician regarding the use of this medicine in children. Special care may be needed. Overdosage: If you think you have taken too much of this medicine contact a poison control center or emergency room at once. NOTE: This  medicine is only for you. Do not share this medicine with others. What if I miss a dose? If you miss a dose, take it as soon as you can. If it is almost time for your next dose, take only that dose. Do not take double or extra doses. What may interact with this medicine? Do not take this medicine with any of the following medications:  gatifloxacin This medicine may also interact with the following medications:  alcohol  certain medicines for blood pressure, heart disease  diuretics  insulin  nateglinide  pioglitazone  quinolone antibiotics like ciprofloxacin, levofloxacin, ofloxacin  repaglinide  some herbal dietary supplements  steroid medicines like prednisone or cortisone  sulfonylureas like glimepiride, glipizide, glyburide  thyroid medicine This list may not describe all possible interactions. Give your health care provider a list of all the medicines, herbs, non-prescription drugs, or dietary supplements you use. Also tell them if you smoke, drink alcohol, or use illegal drugs. Some items may interact with your medicine. What should I watch for while using this medicine? Visit your doctor or health care professional for regular checks on your progress. This medicine can cause a serious condition in which there is too much acid in the blood. If you develop nausea, vomiting, stomach pain, unusual tiredness, or breathing problems, stop taking this medicine and call your doctor right away. If possible, use a ketone dipstick to check for ketones in your urine. A test called the HbA1C (A1C) will be monitored. This is a simple blood test. It measures your blood sugar control over   the last 2 to 3 months. You will receive this test every 3 to 6 months. Learn how to check your blood sugar. Learn the symptoms of low and high blood sugar and how to manage them. Always carry a quick-source of sugar with you in case you have symptoms of low blood sugar. Examples include hard sugar  candy or glucose tablets. Make sure others know that you can choke if you eat or drink when you develop serious symptoms of low blood sugar, such as seizures or unconsciousness. They must get medical help at once. Tell your doctor or health care professional if you have high blood sugar. You might need to change the dose of your medicine. If you are sick or exercising more than usual, you might need to change the dose of your medicine. Do not skip meals. Ask your doctor or health care professional if you should avoid alcohol. Many nonprescription cough and cold products contain sugar or alcohol. These can affect blood sugar. Wear a medical ID bracelet or chain, and carry a card that describes your disease and details of your medicine and dosage times. What side effects may I notice from receiving this medicine? Side effects that you should report to your doctor or health care professional as soon as possible:  allergic reactions like skin rash, itching or hives, swelling of the face, lips, or tongue  breathing problems  dizziness  feeling faint or lightheaded, falls  muscle weakness  nausea, vomiting, unusual stomach upset or pain  new pain or tenderness, change in skin color, sores or ulcers, or infection in legs or feet  penile discharge, itching, or pain in men  signs and symptoms of a genital infection, such as fever; tenderness, redness, or swelling in the genitals or area from the genitals to the back of the rectum  signs and symptoms of low blood sugar such as feeling anxious, confusion, dizziness, increased hunger, unusually weak or tired, sweating, shakiness, cold, irritable, headache, blurred vision, fast heartbeat, loss of consciousness  signs and symptoms of a urinary tract infection, such as fever, chills, a burning feeling when urinating, blood in the urine, back pain  trouble passing urine or change in the amount of urine, including an urgent need to urinate more often, in  larger amounts, or at night  unusual tiredness  vaginal discharge, itching, or odor in women Side effects that usually do not require medical attention (report to your doctor or health care professional if they continue or are bothersome):  mild increase in urination  thirsty This list may not describe all possible side effects. Call your doctor for medical advice about side effects. You may report side effects to FDA at 1-800-FDA-1088. Where should I keep my medicine? Keep out of the reach of children. Store at room temperature between 15 and 30 degrees C (59 and 86 degrees F). Throw away any unused medicine after the expiration date. NOTE: This sheet is a summary. It may not cover all possible information. If you have questions about this medicine, talk to your doctor, pharmacist, or health care provider.  2020 Elsevier/Gold Standard (2018-07-26 18:58:14)  

## 2020-01-03 ENCOUNTER — Other Ambulatory Visit: Payer: Medicare HMO | Admitting: Orthotics

## 2020-01-04 DIAGNOSIS — E119 Type 2 diabetes mellitus without complications: Secondary | ICD-10-CM | POA: Diagnosis not present

## 2020-01-07 ENCOUNTER — Other Ambulatory Visit: Payer: Self-pay | Admitting: Family Medicine

## 2020-01-07 DIAGNOSIS — Z794 Long term (current) use of insulin: Secondary | ICD-10-CM

## 2020-01-07 DIAGNOSIS — N529 Male erectile dysfunction, unspecified: Secondary | ICD-10-CM

## 2020-01-07 DIAGNOSIS — E1169 Type 2 diabetes mellitus with other specified complication: Secondary | ICD-10-CM

## 2020-01-07 DIAGNOSIS — I10 Essential (primary) hypertension: Secondary | ICD-10-CM

## 2020-01-07 MED ORDER — SILDENAFIL CITRATE 50 MG PO TABS: ORAL_TABLET | ORAL | 1 refills | Status: DC

## 2020-01-07 MED ORDER — GABAPENTIN 300 MG PO CAPS: 300.0000 mg | ORAL_CAPSULE | Freq: Two times a day (BID) | ORAL | 1 refills | Status: DC

## 2020-01-07 MED ORDER — METOPROLOL TARTRATE 100 MG PO TABS: 100.0000 mg | ORAL_TABLET | Freq: Two times a day (BID) | ORAL | 1 refills | Status: DC

## 2020-01-07 MED ORDER — METFORMIN HCL 1000 MG PO TABS: 1000.0000 mg | ORAL_TABLET | Freq: Two times a day (BID) | ORAL | 1 refills | Status: DC

## 2020-01-07 MED ORDER — LISINOPRIL-HYDROCHLOROTHIAZIDE 20-12.5 MG PO TABS: 2.0000 | ORAL_TABLET | Freq: Every day | ORAL | 0 refills | Status: DC

## 2020-01-07 NOTE — Telephone Encounter (Signed)
Medication Refill - Medication: gabapentin (NEURONTIN) 300 MG capsule,clopidogrel (PLAVIX) 75 MG tablet,lisinopril-hydrochlorothiazide (ZESTORETIC) 20-12.5 MG tablet,metFORMIN (GLUCOPHAGE) 1000 MG tablet,metoprolol tartrate (LOPRESSOR) 100 MG tablet ,rosuvastatin (CRESTOR) 40 MG tablet  Has the patient contacted their pharmacy? yes (Agent: If no, request that the patient contact the pharmacy for the refill.) (Agent: If yes, when and what did the pharmacy advise?)Contact PCP  Preferred Pharmacy (with phone number or street name):  [facility], [place] Phone:  [phone]  Fax:  [phone]       Agent: Please be advised that RX refills may take up to 3 business days. We ask that you follow-up with your pharmacy.

## 2020-01-07 NOTE — Telephone Encounter (Signed)
Requested medication (s) are due for refill today: yes  Requested medication (s) are on the active medication list: yes  Last refill:  08/08/19-08/07/20 #30 11 refills   Future visit scheduled: yes with cardiology   Notes to clinic: med ordered by different provider     Requested Prescriptions  Pending Prescriptions Disp Refills   clopidogrel (PLAVIX) 75 MG tablet 30 tablet 11    Sig: Take 1 tablet (75 mg total) by mouth daily.      Hematology: Antiplatelets - clopidogrel Failed - 01/07/2020  4:33 PM      Failed - Evaluate AST, ALT within 2 months of therapy initiation.      Failed - HCT in normal range and within 180 days    HCT  Date Value Ref Range Status  12/17/2019 31.3 (L) 39 - 52 % Final   Hematocrit  Date Value Ref Range Status  11/15/2019 40.2 37.5 - 51.0 % Final          Failed - HGB in normal range and within 180 days    Hemoglobin  Date Value Ref Range Status  12/17/2019 10.0 (L) 13.0 - 17.0 g/dL Final  11/15/2019 13.3 13.0 - 17.7 g/dL Final          Passed - ALT in normal range and within 360 days    ALT  Date Value Ref Range Status  12/11/2019 31 0 - 44 U/L Final          Passed - AST in normal range and within 360 days    AST  Date Value Ref Range Status  12/11/2019 25 15 - 41 U/L Final          Passed - PLT in normal range and within 180 days    Platelets  Date Value Ref Range Status  12/17/2019 289 150 - 400 K/uL Final  11/15/2019 300 150 - 450 x10E3/uL Final          Passed - Valid encounter within last 6 months    Recent Outpatient Visits           1 week ago Type 2 diabetes mellitus with other specified complication, with long-term current use of insulin ([place])   [place], [place], MD   5 months ago Type 2 diabetes mellitus with other specified complication, with long-term current use of insulin ([place])   [place], [place], MD   8 months ago Type 2  diabetes mellitus with hypoglycemia without coma, with long-term current use of insulin ([place])   Farley, [place], MD   11 months ago Type 2 diabetes mellitus with hypoglycemia without coma, with long-term current use of insulin ([place])   [place], Charlane Ferretti, MD   1 year ago Essential hypertension   [place], Charlane Ferretti, MD       Future Appointments             In 1 month Gwenlyn Found, Pearletha Forge, MD [facility]             Signed Prescriptions Disp Refills   gabapentin (NEURONTIN) 300 MG capsule 180 capsule 1    Sig: Take 1 capsule (300 mg total) by mouth 2 (two) times daily.      Neurology: Anticonvulsants - gabapentin Passed - 01/07/2020  4:33 PM      Passed - Valid encounter within last  12 months    Recent Outpatient Visits           1 week ago Type 2 diabetes mellitus with other specified complication, with long-term current use of insulin ([place])   [place], [place], MD   5 months ago Type 2 diabetes mellitus with other specified complication, with long-term current use of insulin ([place])   [place], [place], MD   8 months ago Type 2 diabetes mellitus with hypoglycemia without coma, with long-term current use of insulin ([place])   [place], [place], MD   11 months ago Type 2 diabetes mellitus with hypoglycemia without coma, with long-term current use of insulin ([place])   [place], Enobong, MD   1 year ago Essential hypertension   [place], Enobong, MD       Future Appointments             In 1 month Gwenlyn Found, Pearletha Forge, MD [facility], [facility]              lisinopril-hydrochlorothiazide (ZESTORETIC) 20-12.5 MG tablet 180 tablet 0     Sig: Take 2 tablets by mouth daily.      Cardiovascular:  ACEI + Diuretic Combos Failed - 01/07/2020  4:33 PM      Failed - Ca in normal range and within 180 days    Calcium  Date Value Ref Range Status  12/17/2019 8.7 (L) 8.9 - 10.3 mg/dL Final   Calcium, Ion  Date Value Ref Range Status  08/08/2019 1.25 1.15 - 1.40 mmol/L Final          Passed - Na in normal range and within 180 days    Sodium  Date Value Ref Range Status  12/17/2019 137 135 - 145 mmol/L Final  11/15/2019 138 134 - 144 mmol/L Final          Passed - K in normal range and within 180 days    Potassium  Date Value Ref Range Status  12/17/2019 4.2 3.5 - 5.1 mmol/L Final          Passed - Cr in normal range and within 180 days    Creat  Date Value Ref Range Status  06/06/2016 0.86 0.70 - 1.25 mg/dL Final    Comment:      For patients > or = 69 years of age: The upper reference limit for Creatinine is approximately 13% higher for people identified as African-American.      Creatinine, Ser  Date Value Ref Range Status  12/17/2019 1.07 0.61 - 1.24 mg/dL Final   Creatinine, Urine  Date Value Ref Range Status  06/06/2016 112 20 - 370 mg/dL Final          Passed - Patient is not pregnant      Passed - Last BP in normal range    BP Readings from Last 1 Encounters:  12/31/19 133/82          Passed - Valid encounter within last 6 months    Recent Outpatient Visits           1 week ago Type 2 diabetes mellitus with other specified complication, with long-term current use of insulin ([place])   [place], Charlane Ferretti, MD   5 months ago Type 2 diabetes mellitus with other specified complication, with long-term current use of  insulin ([place])   Russell, [place], MD   8 months ago Type 2 diabetes mellitus with hypoglycemia without coma, with long-term current use of insulin ([place])   [place],  [place], MD   11 months ago Type 2 diabetes mellitus with hypoglycemia without coma, with long-term current use of insulin ([place])   [place], [place], MD   1 year ago Essential hypertension   [place], Charlane Ferretti, MD       Future Appointments             In 1 month Gwenlyn Found, Pearletha Forge, MD [facility]              metFORMIN (GLUCOPHAGE) 1000 MG tablet 360 tablet 1    Sig: Take 1 tablet (1,000 mg total) by mouth 2 (two) times daily with a meal.      Endocrinology:  Diabetes - Biguanides Passed - 01/07/2020  4:33 PM      Passed - Cr in normal range and within 360 days    Creat  Date Value Ref Range Status  06/06/2016 0.86 0.70 - 1.25 mg/dL Final    Comment:      For patients > or = 69 years of age: The upper reference limit for Creatinine is approximately 13% higher for people identified as African-American.      Creatinine, Ser  Date Value Ref Range Status  12/17/2019 1.07 0.61 - 1.24 mg/dL Final   Creatinine, Urine  Date Value Ref Range Status  06/06/2016 112 20 - 370 mg/dL Final          Passed - HBA1C is between 0 and 7.9 and within 180 days    HbA1c, POC (controlled diabetic range)  Date Value Ref Range Status  01/16/2019 7.6 (A) 0.0 - 7.0 % Final   Hgb A1c MFr Bld  Date Value Ref Range Status  11/18/2019 7.6 (H) 4.8 - 5.6 % Final    Comment:    (NOTE) Pre diabetes:          5.7%-6.4%  Diabetes:              >6.4%  Glycemic control for   <7.0% adults with diabetes           Passed - eGFR in normal range and within 360 days    GFR, Est African American  Date Value Ref Range Status  04/02/2013 >89 mL/min Final   GFR calc Af Amer  Date Value Ref Range Status  12/17/2019 >60 >60 mL/min Final   GFR, Est Non African American  Date Value Ref Range Status  04/02/2013 87 mL/min Final    Comment:      The estimated GFR is a calculation valid for adults  (>=20 years old) that uses the CKD-EPI algorithm to adjust for age and sex. It is   not to be used for children, pregnant women, hospitalized patients,    patients on dialysis, or with rapidly changing kidney function. According to the NKDEP, eGFR >89 is normal, 60-89 shows mild impairment, 30-59 shows moderate impairment, 15-29 shows severe impairment and <15 is ESRD.     GFR calc non Af Amer  Date Value Ref Range Status  12/17/2019 >60 >60 mL/min Final          Passed - Valid encounter within last 6 months    Recent Outpatient Visits  1 week ago Type 2 diabetes mellitus with other specified complication, with long-term current use of insulin ([place])   Kane, [place], MD   5 months ago Type 2 diabetes mellitus with other specified complication, with long-term current use of insulin ([place])   [place], [place], MD   8 months ago Type 2 diabetes mellitus with hypoglycemia without coma, with long-term current use of insulin ([place])   [place], Charlane Ferretti, MD   11 months ago Type 2 diabetes mellitus with hypoglycemia without coma, with long-term current use of insulin ([place])   [place], Charlane Ferretti, MD   1 year ago Essential hypertension   [place], Charlane Ferretti, MD       Future Appointments             In 1 month Gwenlyn Found, Pearletha Forge, MD [facility]              metoprolol tartrate (LOPRESSOR) 100 MG tablet 180 tablet 1    Sig: Take 1 tablet (100 mg total) by mouth 2 (two) times daily.      Cardiovascular:  Beta Blockers Passed - 01/07/2020  4:33 PM      Passed - Last BP in normal range    BP Readings from Last 1 Encounters:  12/31/19 133/82          Passed - Last Heart Rate in normal range    Pulse Readings from Last 1 Encounters:  12/31/19 73           Passed - Valid encounter within last 6 months    Recent Outpatient Visits           1 week ago Type 2 diabetes mellitus with other specified complication, with long-term current use of insulin ([place])   [place], [place], MD   5 months ago Type 2 diabetes mellitus with other specified complication, with long-term current use of insulin ([place])   O'Brien, [place], MD   8 months ago Type 2 diabetes mellitus with hypoglycemia without coma, with long-term current use of insulin ([place])   [place], [place], MD   11 months ago Type 2 diabetes mellitus with hypoglycemia without coma, with long-term current use of insulin ([place])   [place], Enobong, MD   1 year ago Essential hypertension   [place], Enobong, MD       Future Appointments             In 1 month Gwenlyn Found, Pearletha Forge, MD [facility]              sildenafil (VIAGRA) 50 MG tablet 20 tablet 1    Sig: TAKE 2 TABLETS BY MOUTH DAILY AS NEEDED FOR ERECTILE DYSFUNCTION      Urology: Erectile Dysfunction Agents Passed - 01/07/2020  4:33 PM      Passed - Last BP in normal range    BP Readings from Last 1 Encounters:  12/31/19 133/82          Passed - Valid encounter within last 12 months    Recent Outpatient Visits           1 week ago Type 2 diabetes mellitus with other specified  complication, with long-term current use of insulin ([place])   [place], [place], MD   5 months ago Type 2 diabetes mellitus with other specified complication, with long-term current use of insulin ([place])   [place], [place], MD   8 months ago Type 2 diabetes mellitus with hypoglycemia without coma, with long-term current use of insulin ([place])   [place], Charlane Ferretti, MD   11 months ago Type 2 diabetes mellitus with hypoglycemia without coma, with long-term current use of insulin [facility])   [place], MD   1 year ago Essential hypertension   [place], Enobong, MD       Future Appointments             In 1 month Gwenlyn Found, Pearletha Forge, MD [facility] [place], [facility]

## 2020-01-09 MED ORDER — CLOPIDOGREL BISULFATE 75 MG PO TABS: 75.0000 mg | ORAL_TABLET | Freq: Every day | ORAL | 1 refills | Status: DC

## 2020-01-10 ENCOUNTER — Ambulatory Visit: Payer: Medicare HMO | Admitting: Orthotics

## 2020-01-10 ENCOUNTER — Other Ambulatory Visit: Payer: Self-pay

## 2020-01-10 DIAGNOSIS — M869 Osteomyelitis, unspecified: Secondary | ICD-10-CM

## 2020-01-10 NOTE — Progress Notes (Signed)
Patient had appointment today for definitive and final diabetic shoe fitting and delivery.  Patient was seen by Betha, C.Ped, [facility].   The inserts fit well and accomplished full contact with the plantar surface of the foot bilateral; the shoes fit well and offered forefoot freedom, no noticible heel slippage, and good toe clearance w/ the insert in place.  Patient was advised to monitor of any skin irritation, breakdown.  Patient was satisfied with fit and function. 

## 2020-01-13 MED FILL — CLOPIDOGREL 75 MG TABLET: 75 | 90 days supply | Qty: 90 | Fill #3

## 2020-01-16 ENCOUNTER — Ambulatory Visit (INDEPENDENT_AMBULATORY_CARE_PROVIDER_SITE_OTHER): Payer: Medicare HMO | Admitting: Vascular Surgery

## 2020-01-16 ENCOUNTER — Encounter: Payer: Self-pay | Admitting: Vascular Surgery

## 2020-01-16 ENCOUNTER — Ambulatory Visit (HOSPITAL_COMMUNITY)
Admission: RE | Admit: 2020-01-16 | Discharge: 2020-01-16 | Disposition: A | Discharge status: Home / Self Care | Payer: Medicare HMO | Source: Ambulatory Visit | Attending: Vascular Surgery | Admitting: Vascular Surgery

## 2020-01-16 ENCOUNTER — Other Ambulatory Visit: Payer: Self-pay

## 2020-01-16 VITALS — BP 93/63 | HR 74 | Temp 98.2°F | Resp 20 | Ht 68.0 in | Wt 181.0 lb

## 2020-01-16 DIAGNOSIS — I6523 Occlusion and stenosis of bilateral carotid arteries: Secondary | ICD-10-CM | POA: Diagnosis not present

## 2020-01-16 DIAGNOSIS — I739 Peripheral vascular disease, unspecified: Secondary | ICD-10-CM

## 2020-01-16 NOTE — H&P (View-Only) (Signed)
Patient is a 69 year old male who returns for postoperative follow-up today.  He underwent left TCAR carotid stenting December 16, 2019.  He also recently underwent left leg stenting by Dr. Gwenlyn Found in late August for a nonhealing wound.  The foot has now healed.  The patient has no symptoms of TIA amaurosis or stroke.  He continues his Plavix aspirin and statin.  Physical exam:  Vitals:   01/16/20 1538 01/16/20 1540  BP: 100/67 93/63  Pulse: 74   Resp: 20   Temp: 98.2 F (36.8 C)   SpO2: 96%   Weight: 181 lb (82.1 kg)   Height: 5\' 8"  (1.727 m)     Neck: Well-healed left neck incision  Neuro: Symmetric upper extremity lower extremity motor strength 5/5 no facial asymmetry  Extremities: Left foot well-healed  Data: Patient had a duplex ultrasound today which shows greater than 80% right internal carotid artery stenosis but a patent vessel.  Left carotid stent is widely patent.  Assessment: Patient doing well status post left TCAR stent.  He now needs right TCAR stent for asymptomatic right internal carotid artery stenosis in a high risk patient.  Plan: Right TCAR stent scheduled for February 03, 2020 risk benefits possible complications procedure details discussed.  Patient wishes to proceed.  Ruta Hinds, MD [facility] Office: [phone]

## 2020-01-16 NOTE — Progress Notes (Signed)
Patient is a 69 year old male who returns for postoperative follow-up today.  He underwent left TCAR carotid stenting December 16, 2019.  He also recently underwent left leg stenting by Dr. Gwenlyn Found in late August for a nonhealing wound.  The foot has now healed.  The patient has no symptoms of TIA amaurosis or stroke.  He continues his Plavix aspirin and statin.  Physical exam:  Vitals:   01/16/20 1538 01/16/20 1540  BP: 100/67 93/63  Pulse: 74   Resp: 20   Temp: 98.2 F (36.8 C)   SpO2: 96%   Weight: 181 lb (82.1 kg)   Height: 5\' 8"  (1.727 m)     Neck: Well-healed left neck incision  Neuro: Symmetric upper extremity lower extremity motor strength 5/5 no facial asymmetry  Extremities: Left foot well-healed  Data: Patient had a duplex ultrasound today which shows greater than 80% right internal carotid artery stenosis but a patent vessel.  Left carotid stent is widely patent.  Assessment: Patient doing well status post left TCAR stent.  He now needs right TCAR stent for asymptomatic right internal carotid artery stenosis in a high risk patient.  Plan: Right TCAR stent scheduled for February 03, 2020 risk benefits possible complications procedure details discussed.  Patient wishes to proceed.  Ruta Hinds, MD [facility] [place] Office: [phone]

## 2020-01-17 ENCOUNTER — Other Ambulatory Visit: Payer: Self-pay

## 2020-01-20 ENCOUNTER — Other Ambulatory Visit: Payer: Self-pay

## 2020-01-21 ENCOUNTER — Telehealth: Payer: Self-pay | Admitting: *Deleted

## 2020-01-21 DIAGNOSIS — E1169 Type 2 diabetes mellitus with other specified complication: Secondary | ICD-10-CM

## 2020-01-21 DIAGNOSIS — Z794 Long term (current) use of insulin: Secondary | ICD-10-CM

## 2020-01-21 MED ORDER — DAPAGLIFLOZIN PROPANEDIOL 5 MG PO TABS: 5.0000 mg | ORAL_TABLET | Freq: Every day | ORAL | 1 refills | Status: DC

## 2020-01-21 NOTE — Telephone Encounter (Signed)
Request that medication be sent to [facility]-   dapagliflozin propanediol (FARXIGA) 5 MG tabs -

## 2020-01-21 NOTE — Telephone Encounter (Signed)
Prescription was sent on 12/31/19. I have sent it again

## 2020-01-27 ENCOUNTER — Inpatient Hospital Stay (HOSPITAL_COMMUNITY)
Admission: RE | Admit: 2020-01-27 | Discharge: 2020-01-27 | Disposition: A | Discharge status: Home / Self Care | Payer: Medicare HMO | Source: Ambulatory Visit

## 2020-01-27 NOTE — Progress Notes (Addendum)
[place], [place] [address] Phone: [phone] Fax: [phone]      Your procedure is scheduled on 02/03/20.  Report to [facility] Main Entrance "A" at 8:10 A.M., and check in at the Admitting office.  Call this number if you have problems the morning of surgery:  [phone]  Call [phone] if you have any questions prior to your surgery date Monday-Friday 8am-4pm    Remember:  Do not eat or drink after midnight the night before your surgery     Take these medicines the morning of surgery with A SIP OF WATER: Aspirin metoprolol tartrate (LOPRESSOR) rosuvastatin (CRESTOR)   AS NEEDED: gabapentin (NEURONTIN)  As of today, STOP taking any Aleve, Naproxen, Ibuprofen, Motrin, Advil, Goody's, BC's, all herbal medications, fish oil, and all vitamins.   WHAT DO I DO ABOUT MY DIABETES MEDICATION?   Marland Kitchen Do not take oral diabetes medicines (pills) the morning of surgery.   .      DO NOT take Farxiga day before surgery or day of surgery.   . THE NIGHT BEFORE SURGERY, take _____70%__Novolin 70/30______insulin.      . THE MORNING OF SURGERY, DO NOT take Novolin 70/30 insulin.  . The day of surgery, do not take other diabetes injectables, including Byetta (exenatide), Bydureon (exenatide ER), Victoza (liraglutide), or Trulicity (dulaglutide).    HOW TO MANAGE YOUR DIABETES BEFORE AND AFTER SURGERY  Why is it important to control my blood sugar before and after surgery? . Improving blood sugar levels before and after surgery helps healing and can limit problems. . A way of improving blood sugar control is eating a healthy diet by: o  Eating less sugar and carbohydrates o  Increasing activity/exercise o  Talking with your doctor about reaching your blood sugar goals . High blood sugars (greater than 180 mg/dL) can raise your risk of infections and slow your recovery, so you will need to focus  on controlling your diabetes during the weeks before surgery. . Make sure that the doctor who takes care of your diabetes knows about your planned surgery including the date and location.  How do I manage my blood sugar before surgery? . Check your blood sugar at least 4 times a day, starting 2 days before surgery, to make sure that the level is not too high or low. . Check your blood sugar the morning of your surgery when you wake up and every 2 hours until you get to the Short Stay unit. o If your blood sugar is less than 70 mg/dL, you will need to treat for low blood sugar: - Do not take insulin. - Treat a low blood sugar (less than 70 mg/dL) with  cup of clear juice (cranberry or apple), 4 glucose tablets, OR glucose gel. - Recheck blood sugar in 15 minutes after treatment (to make sure it is greater than 70 mg/dL). If your blood sugar is not greater than 70 mg/dL on recheck, call [phone] for further instructions. . Report your blood sugar to the short stay nurse when you get to Short Stay.  . If you are admitted to the hospital after surgery: o Your blood sugar will be checked by the staff and you will probably be given insulin after surgery (instead of oral diabetes medicines) to make sure you have good blood sugar levels. o The goal for blood sugar control after surgery is 80-180 mg/dL.  Do not wear jewelry            Do not wear lotions, powders, colognes, or deodorant.            Do not shave 48 hours prior to surgery.  Men may shave face and neck.            Do not bring valuables to the hospital.            [facility] is not responsible for any belongings or valuables.  Do NOT Smoke (Tobacco/Vaping) or drink Alcohol 24 hours prior to your procedure If you use a CPAP at night, you may bring all equipment for your overnight stay.   Contacts, glasses, dentures or bridgework may not be worn into surgery.      For patients admitted to the hospital,  discharge time will be determined by your treatment team.   Patients discharged the day of surgery will not be allowed to drive home, and someone needs to stay with them for 24 hours.    Special instructions:   [facility]- Preparing For Surgery  Before surgery, you can play an important role. Because skin is not sterile, your skin needs to be as free of germs as possible. You can reduce the number of germs on your skin by washing with CHG (chlorahexidine gluconate) Soap before surgery.  CHG is an antiseptic cleaner which kills germs and bonds with the skin to continue killing germs even after washing.    Oral Hygiene is also important to reduce your risk of infection.  Remember - BRUSH YOUR TEETH THE MORNING OF SURGERY WITH YOUR REGULAR TOOTHPASTE  Please do not use if you have an allergy to CHG or antibacterial soaps. If your skin becomes reddened/irritated stop using the CHG.  Do not shave (including legs and underarms) for at least 48 hours prior to first CHG shower. It is OK to shave your face.  Please follow these instructions carefully.   1. Shower the NIGHT BEFORE SURGERY and the MORNING OF SURGERY with CHG Soap.   2. If you chose to wash your hair, wash your hair first as usual with your normal shampoo.  3. After you shampoo, rinse your hair and body thoroughly to remove the shampoo.  4. Use CHG as you would any other liquid soap. You can apply CHG directly to the skin and wash gently with a scrungie or a clean washcloth.   5. Apply the CHG Soap to your body ONLY FROM THE NECK DOWN.  Do not use on open wounds or open sores. Avoid contact with your eyes, ears, mouth and genitals (private parts). Wash Face and genitals (private parts)  with your normal soap.   6. Wash thoroughly, paying special attention to the area where your surgery will be performed.  7. Thoroughly rinse your body with warm water from the neck down.  8. DO NOT shower/wash with your normal soap after using  and rinsing off the CHG Soap.  9. Pat yourself dry with a CLEAN TOWEL.  10. Wear CLEAN PAJAMAS to bed the night before surgery  11. Place CLEAN SHEETS on your bed the night of your first shower and DO NOT SLEEP WITH PETS.   Day of Surgery: Wear Clean/Comfortable clothing the morning of surgery Do not apply any deodorants/lotions.   Remember to brush your teeth WITH YOUR REGULAR TOOTHPASTE.   Please read over the following fact sheets that you were given.

## 2020-01-30 ENCOUNTER — Other Ambulatory Visit: Payer: Self-pay

## 2020-01-30 ENCOUNTER — Encounter (HOSPITAL_COMMUNITY): Payer: Self-pay

## 2020-01-30 ENCOUNTER — Encounter (HOSPITAL_COMMUNITY)
Admission: RE | Admit: 2020-01-30 | Discharge: 2020-01-30 | Disposition: A | Discharge status: Home / Self Care | Payer: Medicare HMO | Source: Ambulatory Visit | Attending: Vascular Surgery | Admitting: Vascular Surgery

## 2020-01-30 DIAGNOSIS — E119 Type 2 diabetes mellitus without complications: Secondary | ICD-10-CM | POA: Insufficient documentation

## 2020-01-30 DIAGNOSIS — Z01812 Encounter for preprocedural laboratory examination: Secondary | ICD-10-CM | POA: Insufficient documentation

## 2020-01-30 LAB — URINALYSIS, ROUTINE W REFLEX MICROSCOPIC
Bilirubin Urine: NEGATIVE
Glucose, UA: >=500 mg/dL — AB
Ketones, ur: NEGATIVE mg/dL
Leukocytes,Ua: NEGATIVE
Nitrite: NEGATIVE
Protein, ur: 100 mg/dL — AB
Specific Gravity, Urine: 1.015 (ref 1.005–1.030)
pH: 5 (ref 5.0–8.0)

## 2020-01-30 LAB — COMPREHENSIVE METABOLIC PANEL
ALT: 25 U/L (ref 0–44)
AST: 24 U/L (ref 15–41)
Albumin: 4 g/dL (ref 3.5–5.0)
Alkaline Phosphatase: 84 U/L (ref 38–126)
Anion gap: 8 (ref 5–15)
BUN: 27 mg/dL — ABNORMAL HIGH (ref 8–23)
CO2: 26 mmol/L (ref 22–32)
Calcium: 9.5 mg/dL (ref 8.9–10.3)
Chloride: 101 mmol/L (ref 98–111)
Creatinine, Ser: 1.45 mg/dL — ABNORMAL HIGH (ref 0.61–1.24)
GFR, Estimated: 52 mL/min — ABNORMAL LOW (ref >60)
Glucose, Bld: 109 mg/dL — ABNORMAL HIGH (ref 70–99)
Potassium: 4.2 mmol/L (ref 3.5–5.1)
Sodium: 135 mmol/L (ref 135–145)
Total Bilirubin: 0.6 mg/dL (ref 0.3–1.2)
Total Protein: 7.5 g/dL (ref 6.5–8.1)

## 2020-01-30 LAB — CBC
HCT: 41.4 % (ref 39.0–52.0)
Hemoglobin: 13.4 g/dL (ref 13.0–17.0)
MCH: 28 pg (ref 26.0–34.0)
MCHC: 32.4 g/dL (ref 30.0–36.0)
MCV: 86.6 fL (ref 80.0–100.0)
Platelets: 301 10*3/uL (ref 150–400)
RBC: 4.78 MIL/uL (ref 4.22–5.81)
RDW: 14.4 % (ref 11.5–15.5)
WBC: 6.4 10*3/uL (ref 4.0–10.5)
nRBC: 0 % (ref 0.0–0.2)

## 2020-01-30 LAB — PROTIME-INR
INR: 0.9 (ref 0.8–1.2)
Prothrombin Time: 11.8 seconds (ref 11.4–15.2)

## 2020-01-30 LAB — SURGICAL PCR SCREEN
MRSA, PCR: NEGATIVE
Staphylococcus aureus: NEGATIVE

## 2020-01-30 LAB — GLUCOSE, CAPILLARY: Glucose-Capillary: 116 mg/dL — ABNORMAL HIGH (ref 70–99)

## 2020-01-30 LAB — APTT: aPTT: 32 seconds (ref 24–36)

## 2020-01-30 NOTE — Progress Notes (Addendum)
PCP Margarita Rana, MD Cardiologist - Gwenlyn Found, MD  Chest x-ray - 09/17/19 EKG - 12/02/19 Stress Test - 08/30/19 ECHO - 12/02/19 Cardiac Cath - denies  Fasting Blood Sugar - 115-125  Per pt, lowest fasting sugar is about 57 as of last week, but to make sure this doesn't happen he will eat something prior to bed to make sure his sugar does not drop overnight after taking Novolog Checks Blood Sugar 5-6  times a day (pt wears a sensor)   Blood Thinner Instructions: Per pt and surgeon's orders to continue taking ASA and Plavix  Aspirin Instructions: see above    COVID TEST- 01/31/20  Coronavirus Screening  Have you experienced the following symptoms:  Cough yes/no: No Fever (>100.39F)  yes/no: No Runny nose yes/no: No Sore throat yes/no: No Difficulty breathing/shortness of breath  yes/no: No  Have you or a family member traveled in the last 14 days and where? yes/no: No   If the patient indicates "YES" to the above questions, their PAT will be rescheduled to limit the exposure to others and, the surgeon will be notified. THE PATIENT WILL NEED TO BE ASYMPTOMATIC FOR 14 DAYS.   If the patient is not experiencing any of these symptoms, the PAT nurse will instruct them to NOT bring anyone with them to their appointment since they may have these symptoms or traveled as well.   Please remind your patients and families that hospital visitation restrictions are in effect and the importance of the restrictions.     Anesthesia review: cardiac hx  Patient denies shortness of breath, fever, cough and chest pain at PAT appointment   All instructions explained to the patient, with a verbal understanding of the material. Patient agrees to go over the instructions while at home for a better understanding. Patient also instructed to self quarantine after being tested for COVID-19. The opportunity to ask questions was provided.

## 2020-01-31 ENCOUNTER — Other Ambulatory Visit (HOSPITAL_COMMUNITY)
Admission: RE | Admit: 2020-01-31 | Discharge: 2020-01-31 | Disposition: A | Discharge status: Home / Self Care | Payer: Medicare Other | Source: Ambulatory Visit | Attending: Vascular Surgery | Admitting: Vascular Surgery

## 2020-01-31 DIAGNOSIS — Z79899 Other long term (current) drug therapy: Secondary | ICD-10-CM | POA: Diagnosis not present

## 2020-01-31 DIAGNOSIS — Z006 Encounter for examination for normal comparison and control in clinical research program: Secondary | ICD-10-CM | POA: Diagnosis not present

## 2020-01-31 DIAGNOSIS — N529 Male erectile dysfunction, unspecified: Secondary | ICD-10-CM | POA: Diagnosis present

## 2020-01-31 DIAGNOSIS — Z20822 Contact with and (suspected) exposure to covid-19: Secondary | ICD-10-CM | POA: Diagnosis present

## 2020-01-31 DIAGNOSIS — Z7984 Long term (current) use of oral hypoglycemic drugs: Secondary | ICD-10-CM | POA: Diagnosis not present

## 2020-01-31 DIAGNOSIS — Z794 Long term (current) use of insulin: Secondary | ICD-10-CM | POA: Diagnosis not present

## 2020-01-31 DIAGNOSIS — I350 Nonrheumatic aortic (valve) stenosis: Secondary | ICD-10-CM | POA: Diagnosis present

## 2020-01-31 DIAGNOSIS — E785 Hyperlipidemia, unspecified: Secondary | ICD-10-CM | POA: Diagnosis present

## 2020-01-31 DIAGNOSIS — Z01812 Encounter for preprocedural laboratory examination: Secondary | ICD-10-CM | POA: Insufficient documentation

## 2020-01-31 DIAGNOSIS — E7800 Pure hypercholesterolemia, unspecified: Secondary | ICD-10-CM | POA: Diagnosis not present

## 2020-01-31 DIAGNOSIS — E559 Vitamin D deficiency, unspecified: Secondary | ICD-10-CM | POA: Diagnosis not present

## 2020-01-31 DIAGNOSIS — Z79891 Long term (current) use of opiate analgesic: Secondary | ICD-10-CM | POA: Diagnosis not present

## 2020-01-31 DIAGNOSIS — E1151 Type 2 diabetes mellitus with diabetic peripheral angiopathy without gangrene: Secondary | ICD-10-CM | POA: Diagnosis present

## 2020-01-31 DIAGNOSIS — I10 Essential (primary) hypertension: Secondary | ICD-10-CM | POA: Diagnosis present

## 2020-01-31 DIAGNOSIS — I6521 Occlusion and stenosis of right carotid artery: Secondary | ICD-10-CM | POA: Diagnosis not present

## 2020-01-31 DIAGNOSIS — Z7982 Long term (current) use of aspirin: Secondary | ICD-10-CM | POA: Diagnosis not present

## 2020-01-31 DIAGNOSIS — Z95828 Presence of other vascular implants and grafts: Secondary | ICD-10-CM | POA: Diagnosis not present

## 2020-01-31 LAB — SARS CORONAVIRUS 2 (TAT 6-24 HRS): SARS Coronavirus 2: NEGATIVE

## 2020-01-31 NOTE — Progress Notes (Signed)
Anesthesia Chart Review:  Case: [id number] Date/Time: 02/03/20 0952   Procedure: RIGHT TRANSCAROTID ARTERY REVASCULARIZATION (Right )   Anesthesia type: General   Pre-op diagnosis: RIght Carotid Artery Stenosis   Location: [facility] OR ROOM 48 / [place] OR   Surgeons: Elam Dutch, MD      DISCUSSION: Patient is a 69 year old male scheduled for the above procedure. He was recently found to have asymptomatic BICA stenosis, > 80% by [place]. He was considered for bilateral transcarotid artery revascularization (TCAR) versus carotid endarterectomy (CEA). On 12/02/19, cardiologist Dr. Gwenlyn Found wrote, "He does have a low risk Myoview performed back in June as well as moderately severe aortic stenosis however he is asymptomatic from this. I cleared him at mildly elevated risk given his aortic stenosis and moderate LV dysfunction." Ultimtately decision made to proceed with TCAR given his AS and LV dysfunction. He underwent left TCAR on 12/16/19 and is now scheduled for the same procedure on the right.   History includes never smoker, DM2, HTN, hypercholesterolemia, murmur/AS (moderate-severe AS 08/2019), neuropathy, pancreatitis (08/2009, 07/2012), carotid artery disease, PAD (s/p right popliteal & SFA atherectomy/angioplasty 08/08/19; PTA/stenting left popliteal artery 11/18/19), carotid artery stenosis (s/p left TCAR 12/16/19). June 2021 stress test revealed likely prior inferior infarct, inferior akinesis, LVEF 38%, no ischemia.    Last A1c 7.6% 11/18/19, He has a [employer] device.  He reported home fasting glucose readings ~115-125.  He is to continue ASA and Plavix perioperatively.  Presurgical COVID-19 test is scheduled for 01/31/2020.  Anesthesia team to evaluate on the day of surgery.   VS: BP (!) 154/83   Pulse 60   Temp 36.6 C (Oral)   Resp 19   Ht '5\' 8"'  (1.727 m)   Wt 82.1 kg   SpO2 100%   BMI 27.54 kg/m   PROVIDERS: [name], MD is PCP Quay Burow, MD is cardiologist   LABS: Labs  reviewed: Acceptable for surgery. Since 07/2019, Cr 1.08-2.00, primarily in the 1.1-1.3 range.   (all labs ordered are listed, but only abnormal results are displayed)  Labs Reviewed  GLUCOSE, CAPILLARY - Abnormal; Notable for the following components:      Result Value   Glucose-Capillary 116 (*)    All other components within normal limits  COMPREHENSIVE METABOLIC PANEL - Abnormal; Notable for the following components:   Glucose, Bld 109 (*)    BUN 27 (*)    Creatinine, Ser 1.45 (*)    GFR, Estimated 52 (*)    All other components within normal limits  URINALYSIS, ROUTINE W REFLEX MICROSCOPIC - Abnormal; Notable for the following components:   Glucose, UA >=500 (*)    Hgb urine dipstick SMALL (*)    Protein, ur 100 (*)    Bacteria, UA RARE (*)    All other components within normal limits  SURGICAL PCR SCREEN  APTT  CBC  PROTIME-INR  TYPE AND SCREEN     IMAGES: CTA head & neck 11/13/19 (pre TCAR): IMPRESSION: - No acute intracranial abnormality. Mild chronic microvascular ischemic changes. - Mixed but primarily noncalcified plaque at the ICA origins causing approximately 70% stenosis. - No significant intracranial stenosis.   EKG: 12/02/19: Sinus rhythm with marked sinus arrhythmia   CV: Carotid US 01/16/20: Summary:  Right Carotid: Velocities in the right ICA are consistent with a 80-99% stenosis. Left Carotid: Patent stent with no stenosis.  Vertebrals: Bilateral vertebral arteries demonstrate antegrade flow.  Subclavians: Normal flow hemodynamics were seen in bilateral subclavian arteries.    Nuclear  stress test 08/30/19:  The left ventricular ejection fraction is moderately decreased (30-44%).  Nuclear stress EF: 38%.  No T wave inversion was noted during stress.  There was no ST segment deviation noted during stress.  Defect 1: There is a medium defect of moderate severity present in the basal inferior, mid inferior and apex location.  This is an  intermediate risk study.  Findings consistent with prior myocardial infarction. Medium size, moderate intensity fixed inferior perfusion defect, likely scar. LVEF 38% with inferior akinesis. This is an intermediate risk study. No prior study for comparison.   Echo 08/27/19: IMPRESSIONS  1. Left ventricular ejection fraction, by estimation, is 40 to 45%. The  left ventricle has mildly decreased function. The left ventricle  demonstrates global hypokinesis. There is mild concentric left ventricular  hypertrophy. Left ventricular diastolic  parameters are consistent with Grade I diastolic dysfunction (impaired  relaxation). The average left ventricular global longitudinal strain is  -13.1 %.  2. Right ventricular systolic function is normal. The right ventricular  size is normal. There is normal pulmonary artery systolic pressure.  3. Left atrial size was moderately dilated.  4. The mitral valve is normal in structure. No evidence of mitral valve  regurgitation. No evidence of mitral stenosis.  5. The aortic valve is normal in structure. Aortic valve regurgitation is  not visualized. Moderate to severe aortic valve stenosis. Aortic valve  mean gradient measures 13.0 mmHg. Aortic valve Vmax measures 2.35 m/s.  6. The inferior vena cava is normal in size with greater than 50%  respiratory variability, suggesting right atrial pressure of 3 mmHg.    Past Medical History:  Diagnosis Date  . Aortic stenosis   . Carotid artery occlusion   . Diabetes mellitus without complication ([place])   . Dyslipidemia 09/27/2012  . Erectile dysfunction 09/27/2012  . Heart murmur   . Hypercholesteremia   . Hyperlipidemia 08/12/2012  . Hypertension   . Hyponatremia 08/14/2012  . Neuropathy   . Pancreatitis   . Pancreatitis, acute 09/27/2012  . Peripheral vascular disease ([place])   . Vitamin D deficiency 06/23/2015    Past Surgical History:  Procedure Laterality Date  . ABDOMINAL AORTOGRAM W/LOWER  EXTREMITY N/A 08/08/2019   Procedure: ABDOMINAL AORTOGRAM W/LOWER EXTREMITY;  Surgeon: Lorretta Harp, MD;  Location: [place] [facility];  Service: Cardiovascular;  Laterality: N/A;  . ABDOMINAL AORTOGRAM W/LOWER EXTREMITY N/A 11/18/2019   Procedure: ABDOMINAL AORTOGRAM W/LOWER EXTREMITY;  Surgeon: Lorretta Harp, MD;  Location: [place] [facility];  Service: Cardiovascular;  Laterality: N/A;  . COLONOSCOPY  12 years ago   in [place] clinic= normal exam per pt  . KNEE SURGERY    . PERIPHERAL VASCULAR INTERVENTION Right 08/08/2019   Procedure: PERIPHERAL VASCULAR INTERVENTION;  Surgeon: Lorretta Harp, MD;  Location: [place] [facility];  Service: Cardiovascular;  Laterality: Right;  . PERIPHERAL VASCULAR INTERVENTION Left 11/18/2019   Procedure: PERIPHERAL VASCULAR INTERVENTION;  Surgeon: Lorretta Harp, MD;  Location: [place] [facility];  Service: Cardiovascular;  Laterality: Left;  left popliteal artery  . TRANSCAROTID ARTERY REVASCULARIZATION Left 12/16/2019   Procedure: TRANSCAROTID ARTERY REVASCULARIZATION;  Surgeon: Elam Dutch, MD;  Location: [facility];  Service: Vascular;  Laterality: Left;  . ULTRASOUND GUIDANCE FOR VASCULAR ACCESS Right 12/16/2019   Procedure: ULTRASOUND GUIDANCE FOR VASCULAR ACCESS;  Surgeon: Elam Dutch, MD;  Location: [facility];  Service: Vascular;  Laterality: Right;    MEDICATIONS: . Acetaminophen-Codeine (TYLENOL/CODEINE #3) 300-30 MG tablet  .  aspirin EC 81 MG tablet  . Blood Glucose Monitoring Suppl (ONETOUCH VERIO) w/Device KIT  . clopidogrel (PLAVIX) 75 MG tablet  . collagenase (SANTYL) ointment  . Continuous Blood Gluc Receiver (FREESTYLE LIBRE 14 DAY READER) DEVI  . Continuous Blood Gluc Sensor (FREESTYLE LIBRE 14 DAY SENSOR) MISC  . dapagliflozin propanediol (FARXIGA) 5 MG TABS tablet  . gabapentin (NEURONTIN) 300 MG capsule  . glucose blood test strip  . insulin NPH-regular Human (NOVOLIN 70/30) (70-30) 100 UNIT/ML injection  . Insulin  Syringe-Needle U-100 (TRUEPLUS INSULIN SYRINGE) 31G X 5/16" 0.3 ML MISC  . lisinopril-hydrochlorothiazide (ZESTORETIC) 20-12.5 MG tablet  . metFORMIN (GLUCOPHAGE) 1000 MG tablet  . metoprolol tartrate (LOPRESSOR) 100 MG tablet  . ondansetron (ZOFRAN) 4 MG tablet  . ONETOUCH DELICA LANCETS 54H MISC  . rosuvastatin (CRESTOR) 40 MG tablet  . sildenafil (VIAGRA) 50 MG tablet  . silver sulfADIAZINE (SILVADENE) 1 % cream   . sodium chloride flush (NS) 0.9 % injection 3 mL  . sodium chloride flush (NS) 0.9 % injection 3 mL    Myra Gianotti, PA-C Surgical Short Stay/Anesthesiology [facility] Phone [phone] [facility] Phone [phone] 01/31/2020 10:19 AM

## 2020-01-31 NOTE — Anesthesia Preprocedure Evaluation (Addendum)
Anesthesia Evaluation  Patient identified by MRN, date of birth, ID band Patient awake    Reviewed: Allergy & Precautions, NPO status , Patient's Chart, lab work & pertinent test results, reviewed documented beta blocker date and time   Airway Mallampati: II  TM Distance: >3 FB Neck ROM: Full    Dental  (+) Dental Advisory Given   Pulmonary neg pulmonary ROS,    Pulmonary exam normal breath sounds clear to auscultation       Cardiovascular hypertension, Pt. on home beta blockers and Pt. on medications + Peripheral Vascular Disease  + Valvular Problems/Murmurs  Rhythm:Regular Rate:Normal + Systolic murmurs Echo 10/5460 1. Left ventricular ejection fraction, by estimation, is 40 to 45%. The left ventricle has mildly decreased function. The left ventricle demonstrates global hypokinesis. There is mild concentric left ventricular hypertrophy. Left ventricular diastolic parameters are consistent with Grade I diastolic dysfunction (impaired relaxation). The average left ventricular global longitudinal strain is -13.1 %.  2. Right ventricular systolic function is normal. The right ventricular size is normal. There is normal pulmonary artery systolic pressure.  3. Left atrial size was moderately dilated.  4. The mitral valve is normal in structure. No evidence of mitral valve regurgitation. No evidence of mitral stenosis.  5. The aortic valve is normal in structure. Aortic valve regurgitation is not visualized. Moderate to severe aortic valve stenosis. Aortic valve mean gradient measures 13.0 mmHg. Aortic valve Vmax measures 2.35 m/s.  6. The inferior vena cava is normal in size with greater than 50% respiratory variability, suggesting right atrial pressure of 3 mmHg.    Neuro/Psych negative neurological ROS     GI/Hepatic negative GI ROS, Neg liver ROS,   Endo/Other  negative endocrine ROSdiabetes  Renal/GU negative Renal ROS      Musculoskeletal negative musculoskeletal ROS (+)   Abdominal   Peds  Hematology negative hematology ROS (+)   Anesthesia Other Findings   Reproductive/Obstetrics                                                            Anesthesia Evaluation  Patient identified by MRN, date of birth, ID band Patient awake    Reviewed: Allergy & Precautions, NPO status , Patient's Chart, lab work & pertinent test results, reviewed documented beta blocker date and time   Airway Mallampati: II  TM Distance: >3 FB Neck ROM: Full    Dental  (+) Teeth Intact, Dental Advisory Given   Pulmonary neg pulmonary ROS,    breath sounds clear to auscultation       Cardiovascular hypertension, Pt. on medications and Pt. on home beta blockers + Peripheral Vascular Disease  + Valvular Problems/Murmurs AS  Rhythm:Regular Rate:Normal  Carotid US 11/07/19: Summary:  - Bilateral Carotid: Velocities in BICA are consistent with a 80-99% stenosis.  - Vertebrals: Bilateral vertebral arteries demonstrate antegrade flow.  - Subclavians: Normal flow hemodynamics were seen in bilateral subclavian arteries.   Nuclear stress test 08/30/19: The left ventricular ejection fraction is moderately decreased (30-44%). Nuclear stress EF: 38%. There is a medium defect of moderate severity present in the basal inferior, mid inferior and apex location. Findings consistent with prior myocardial infarction. Medium size, moderate intensity fixed inferior perfusion defect, likely scar. LVEF 38% with inferior akinesis. Intermediate risk study.    Echo  08/27/19: 1. Left ventricular ejection fraction, by estimation, is 40 to 45%. The  left ventricle has mildly decreased function. The left ventricle  demonstrates global hypokinesis. There is mild concentric left ventricular  hypertrophy. Left ventricular diastolic  parameters are consistent with Grade I diastolic dysfunction (impaired   relaxation). The average left ventricular global longitudinal strain is  -13.1 %.  2. Right ventricular systolic function is normal. The right ventricular  size is normal. There is normal pulmonary artery systolic pressure.  3. Left atrial size was moderately dilated.  4. The mitral valve is normal in structure. No evidence of mitral valve  regurgitation. No evidence of mitral stenosis.  5. The aortic valve is normal in structure. Aortic valve regurgitation is  not visualized. Moderate to severe aortic valve stenosis. Aortic valve  mean gradient measures 13.0 mmHg. Aortic valve Vmax measures 2.35 m/s.  6. The inferior vena cava is normal in size with greater than 50%  respiratory variability, suggesting right atrial pressure of 3 mmHg.      Neuro/Psych negative neurological ROS  negative psych ROS   GI/Hepatic negative GI ROS, Neg liver ROS,   Endo/Other  diabetes, Type 2, Insulin Dependent, Oral Hypoglycemic Agents  Renal/GU      Musculoskeletal negative musculoskeletal ROS (+)   Abdominal Normal abdominal exam  (+)   Peds  Hematology negative hematology ROS (+)   Anesthesia Other Findings   Reproductive/Obstetrics                           Anesthesia Physical Anesthesia Plan  ASA: III  Anesthesia Plan: General   Post-op Pain Management:    Induction: Intravenous  PONV Risk Score and Plan: 3 and Ondansetron, Dexamethasone and Midazolam  Airway Management Planned: Oral ETT  Additional Equipment: Arterial line  Intra-op Plan:   Post-operative Plan: Extubation in OR  Informed Consent: I have reviewed the patients History and Physical, chart, labs and discussed the procedure including the risks, benefits and alternatives for the proposed anesthesia with the patient or authorized representative who has indicated his/her understanding and acceptance.     Dental advisory given  Plan Discussed with: CRNA  Anesthesia Plan  Comments: (See PAT note written 12/12/2019 by Myra Gianotti, PA-C.  Lab Results      Component                Value               Date                      WBC                      8.9                 12/11/2019                HGB                      14.1                12/11/2019                HCT                      43.4                12/11/2019  MCV                      85.9                12/11/2019                PLT                      406 (H)             12/11/2019            Lab Results      Component                Value               Date                      CREATININE               1.01                12/11/2019                BUN                      13                  12/11/2019                NA                       138                 12/11/2019                K                        3.8                 12/11/2019                CL                       100                 12/11/2019                CO2                      27                  12/11/2019            Echo:  1. Left ventricular ejection fraction, by estimation, is 40 to 45%. The  left ventricle has mildly decreased function. The left ventricle  demonstrates global hypokinesis. There is mild concentric left ventricular  hypertrophy. Left ventricular diastolic  parameters are consistent with Grade I diastolic dysfunction (impaired  relaxation). The average left ventricular global longitudinal strain is  -13.1 %.  2. Right ventricular systolic function is normal. The right ventricular  size is normal. There is normal pulmonary artery systolic pressure.  3. Left atrial size was moderately dilated.  4. The mitral valve is normal in structure. No evidence of mitral valve  regurgitation. No evidence of mitral stenosis.  5. The aortic valve is normal in structure. Aortic valve regurgitation is  not visualized. Moderate to severe aortic valve stenosis. Aortic valve  mean gradient  measures 13.0 mmHg. Aortic valve Vmax measures 2.35 m/s.  6. The inferior vena cava is normal in size with greater than 50%  respiratory variability, suggesting right atrial pressure of 3 mmHg. )      Anesthesia Quick Evaluation  Anesthesia Physical Anesthesia Plan  ASA: IV  Anesthesia Plan: General   Post-op Pain Management:    Induction: Intravenous  PONV Risk Score and Plan: 2 and Ondansetron, Dexamethasone and Treatment may vary due to age or medical condition  Airway Management Planned: Oral ETT  Additional Equipment: Arterial line  Intra-op Plan:   Post-operative Plan: Extubation in OR  Informed Consent: I have reviewed the patients History and Physical, chart, labs and discussed the procedure including the risks, benefits and alternatives for the proposed anesthesia with the patient or authorized representative who has indicated his/her understanding and acceptance.     Dental advisory given  Plan Discussed with: CRNA  Anesthesia Plan Comments: (PAT note written 01/31/2020 by Myra Gianotti, PA-C. )      Anesthesia Quick Evaluation

## 2020-02-03 ENCOUNTER — Encounter (HOSPITAL_COMMUNITY): Payer: Self-pay | Admitting: Vascular Surgery

## 2020-02-03 ENCOUNTER — Inpatient Hospital Stay (HOSPITAL_COMMUNITY): Payer: Medicare Other | Admitting: Certified Registered Nurse Anesthetist

## 2020-02-03 ENCOUNTER — Inpatient Hospital Stay (HOSPITAL_COMMUNITY)
Admission: RE | Admit: 2020-02-03 | Discharge: 2020-02-04 | DRG: 036 | Disposition: A | Discharge status: Home / Self Care | Payer: Medicare Other | Source: Ambulatory Visit | Attending: Vascular Surgery | Admitting: Vascular Surgery

## 2020-02-03 ENCOUNTER — Inpatient Hospital Stay (HOSPITAL_COMMUNITY): Payer: Medicare Other | Admitting: Physician Assistant

## 2020-02-03 ENCOUNTER — Encounter (HOSPITAL_COMMUNITY)
Admission: RE | Disposition: A | Discharge status: Home / Self Care | Payer: Self-pay | Source: Ambulatory Visit | Attending: Vascular Surgery

## 2020-02-03 ENCOUNTER — Inpatient Hospital Stay (HOSPITAL_COMMUNITY): Payer: Medicare Other

## 2020-02-03 DIAGNOSIS — Z95828 Presence of other vascular implants and grafts: Secondary | ICD-10-CM

## 2020-02-03 DIAGNOSIS — Z794 Long term (current) use of insulin: Secondary | ICD-10-CM

## 2020-02-03 DIAGNOSIS — I350 Nonrheumatic aortic (valve) stenosis: Secondary | ICD-10-CM | POA: Diagnosis present

## 2020-02-03 DIAGNOSIS — Z7984 Long term (current) use of oral hypoglycemic drugs: Secondary | ICD-10-CM | POA: Diagnosis not present

## 2020-02-03 DIAGNOSIS — I6529 Occlusion and stenosis of unspecified carotid artery: Secondary | ICD-10-CM | POA: Diagnosis present

## 2020-02-03 DIAGNOSIS — E785 Hyperlipidemia, unspecified: Secondary | ICD-10-CM | POA: Diagnosis present

## 2020-02-03 DIAGNOSIS — Z7982 Long term (current) use of aspirin: Secondary | ICD-10-CM

## 2020-02-03 DIAGNOSIS — I10 Essential (primary) hypertension: Secondary | ICD-10-CM | POA: Diagnosis present

## 2020-02-03 DIAGNOSIS — I6521 Occlusion and stenosis of right carotid artery: Secondary | ICD-10-CM | POA: Diagnosis present

## 2020-02-03 DIAGNOSIS — E559 Vitamin D deficiency, unspecified: Secondary | ICD-10-CM | POA: Diagnosis not present

## 2020-02-03 DIAGNOSIS — E7800 Pure hypercholesterolemia, unspecified: Secondary | ICD-10-CM | POA: Diagnosis not present

## 2020-02-03 DIAGNOSIS — Z79891 Long term (current) use of opiate analgesic: Secondary | ICD-10-CM

## 2020-02-03 DIAGNOSIS — Z79899 Other long term (current) drug therapy: Secondary | ICD-10-CM | POA: Diagnosis not present

## 2020-02-03 DIAGNOSIS — N529 Male erectile dysfunction, unspecified: Secondary | ICD-10-CM | POA: Diagnosis present

## 2020-02-03 DIAGNOSIS — E1151 Type 2 diabetes mellitus with diabetic peripheral angiopathy without gangrene: Secondary | ICD-10-CM | POA: Diagnosis present

## 2020-02-03 DIAGNOSIS — Z006 Encounter for examination for normal comparison and control in clinical research program: Secondary | ICD-10-CM | POA: Diagnosis not present

## 2020-02-03 DIAGNOSIS — Z20822 Contact with and (suspected) exposure to covid-19: Secondary | ICD-10-CM | POA: Diagnosis present

## 2020-02-03 HISTORY — PX: TRANSCAROTID ARTERY REVASCULARIZATIONÂ: SHX6778

## 2020-02-03 HISTORY — PX: ULTRASOUND GUIDANCE FOR VASCULAR ACCESS: SHX6516

## 2020-02-03 LAB — TYPE AND SCREEN
ABO/RH(D): O POS
Antibody Screen: POSITIVE

## 2020-02-03 LAB — CBC
HCT: 39.4 % (ref 39.0–52.0)
Hemoglobin: 12.8 g/dL — ABNORMAL LOW (ref 13.0–17.0)
MCH: 27.8 pg (ref 26.0–34.0)
MCHC: 32.5 g/dL (ref 30.0–36.0)
MCV: 85.7 fL (ref 80.0–100.0)
Platelets: 253 10*3/uL (ref 150–400)
RBC: 4.6 MIL/uL (ref 4.22–5.81)
RDW: 14.4 % (ref 11.5–15.5)
WBC: 6.4 10*3/uL (ref 4.0–10.5)
nRBC: 0 % (ref 0.0–0.2)

## 2020-02-03 LAB — POCT ACTIVATED CLOTTING TIME
Activated Clotting Time: 257 seconds
Activated Clotting Time: 246 seconds
Activated Clotting Time: 285 seconds

## 2020-02-03 LAB — CREATININE, SERUM
Creatinine, Ser: 1.42 mg/dL — ABNORMAL HIGH (ref 0.61–1.24)
GFR, Estimated: 53 mL/min — ABNORMAL LOW (ref >60)

## 2020-02-03 LAB — HEMOGLOBIN A1C
Hgb A1c MFr Bld: 7.7 % — ABNORMAL HIGH (ref 4.8–5.6)
Mean Plasma Glucose: 174.29 mg/dL

## 2020-02-03 LAB — GLUCOSE, CAPILLARY
Glucose-Capillary: 125 mg/dL — ABNORMAL HIGH (ref 70–99)
Glucose-Capillary: 137 mg/dL — ABNORMAL HIGH (ref 70–99)
Glucose-Capillary: 130 mg/dL — ABNORMAL HIGH (ref 70–99)
Glucose-Capillary: 126 mg/dL — ABNORMAL HIGH (ref 70–99)
Glucose-Capillary: 189 mg/dL — ABNORMAL HIGH (ref 70–99)

## 2020-02-03 SURGERY — TRANSCAROTID ARTERY REVASCULARIZATION (TCAR)
Anesthesia: General | Site: Neck | Laterality: Right

## 2020-02-03 MED ORDER — MIDAZOLAM HCL 2 MG/2ML IJ SOLN
INTRAMUSCULAR | Status: DC | PRN
  Administered 2020-02-03: 2 mg via INTRAVENOUS

## 2020-02-03 MED ORDER — PHENYLEPHRINE HCL-NACL 10-0.9 MG/250ML-% IV SOLN
INTRAVENOUS | Status: DC | PRN
  Administered 2020-02-03: 25 ug/min via INTRAVENOUS

## 2020-02-03 MED ORDER — GABAPENTIN 300 MG PO CAPS
300.0000 mg | ORAL_CAPSULE | Freq: Two times a day (BID) | ORAL | Status: DC
  Administered 2020-02-03: 300 mg via ORAL
  Filled 2020-02-03: qty 1

## 2020-02-03 MED ORDER — DAPAGLIFLOZIN PROPANEDIOL 5 MG PO TABS
5.0000 mg | ORAL_TABLET | Freq: Every day | ORAL | Status: DC
  Filled 2020-02-03: qty 1

## 2020-02-03 MED ORDER — PROPOFOL 10 MG/ML IV BOLUS
INTRAVENOUS | Status: AC
  Filled 2020-02-03: qty 20

## 2020-02-03 MED ORDER — SODIUM CHLORIDE 0.9 % IV SOLN: INTRAVENOUS | Status: DC

## 2020-02-03 MED ORDER — CEFAZOLIN SODIUM-DEXTROSE 2-4 GM/100ML-% IV SOLN
2.0000 g | Freq: Three times a day (TID) | INTRAVENOUS | Status: AC
  Administered 2020-02-03 – 2020-02-04 (×2): 2 g via INTRAVENOUS
  Filled 2020-02-03 (×2): qty 100

## 2020-02-03 MED ORDER — ROCURONIUM BROMIDE 10 MG/ML (PF) SYRINGE
PREFILLED_SYRINGE | INTRAVENOUS | Status: AC
  Filled 2020-02-03: qty 10

## 2020-02-03 MED ORDER — HEPARIN SODIUM (PORCINE) 5000 UNIT/ML IJ SOLN
5000.0000 [IU] | Freq: Three times a day (TID) | INTRAMUSCULAR | Status: DC
  Administered 2020-02-03 – 2020-02-04 (×2): 5000 [IU] via SUBCUTANEOUS
  Filled 2020-02-03 (×2): qty 1

## 2020-02-03 MED ORDER — HEPARIN SODIUM (PORCINE) 1000 UNIT/ML IJ SOLN
INTRAMUSCULAR | Status: DC | PRN
  Administered 2020-02-03: 1000 [IU] via INTRAVENOUS
  Administered 2020-02-03: 9000 [IU] via INTRAVENOUS
  Administered 2020-02-03: 3000 [IU] via INTRAVENOUS

## 2020-02-03 MED ORDER — HEPARIN SODIUM (PORCINE) 1000 UNIT/ML IJ SOLN
INTRAMUSCULAR | Status: AC
  Filled 2020-02-03: qty 1

## 2020-02-03 MED ORDER — PROPOFOL 10 MG/ML IV BOLUS
INTRAVENOUS | Status: DC | PRN
  Administered 2020-02-03: 30 mg via INTRAVENOUS
  Administered 2020-02-03: 20 mg via INTRAVENOUS
  Administered 2020-02-03: 200 mg via INTRAVENOUS

## 2020-02-03 MED ORDER — SUGAMMADEX SODIUM 200 MG/2ML IV SOLN
INTRAVENOUS | Status: DC | PRN
  Administered 2020-02-03: 200 mg via INTRAVENOUS

## 2020-02-03 MED ORDER — CHLORHEXIDINE GLUCONATE CLOTH 2 % EX PADS: 6.0000 | MEDICATED_PAD | Freq: Once | CUTANEOUS | Status: DC

## 2020-02-03 MED ORDER — EPHEDRINE SULFATE-NACL 50-0.9 MG/10ML-% IV SOSY
PREFILLED_SYRINGE | INTRAVENOUS | Status: DC | PRN
  Administered 2020-02-03 (×2): 5 mg via INTRAVENOUS

## 2020-02-03 MED ORDER — ONDANSETRON HCL 4 MG/2ML IJ SOLN: 4.0000 mg | Freq: Four times a day (QID) | INTRAMUSCULAR | Status: DC | PRN

## 2020-02-03 MED ORDER — FENTANYL CITRATE (PF) 250 MCG/5ML IJ SOLN
INTRAMUSCULAR | Status: AC
  Filled 2020-02-03: qty 5

## 2020-02-03 MED ORDER — METOPROLOL TARTRATE 100 MG PO TABS
100.0000 mg | ORAL_TABLET | Freq: Two times a day (BID) | ORAL | Status: DC
  Administered 2020-02-03: 100 mg via ORAL
  Filled 2020-02-03: qty 1

## 2020-02-03 MED ORDER — LIDOCAINE 2% (20 MG/ML) 5 ML SYRINGE
INTRAMUSCULAR | Status: AC
  Filled 2020-02-03: qty 5

## 2020-02-03 MED ORDER — CEFAZOLIN SODIUM-DEXTROSE 2-4 GM/100ML-% IV SOLN
INTRAVENOUS | Status: AC
  Filled 2020-02-03: qty 100

## 2020-02-03 MED ORDER — CLOPIDOGREL BISULFATE 75 MG PO TABS: 75.0000 mg | ORAL_TABLET | Freq: Every day | ORAL | Status: DC

## 2020-02-03 MED ORDER — GUAIFENESIN-DM 100-10 MG/5ML PO SYRP: 15.0000 mL | ORAL_SOLUTION | ORAL | Status: DC | PRN

## 2020-02-03 MED ORDER — PHENYLEPHRINE 40 MCG/ML (10ML) SYRINGE FOR IV PUSH (FOR BLOOD PRESSURE SUPPORT)
PREFILLED_SYRINGE | INTRAVENOUS | Status: DC | PRN
  Administered 2020-02-03 (×3): 80 ug via INTRAVENOUS

## 2020-02-03 MED ORDER — IODIXANOL 320 MG/ML IV SOLN
INTRAVENOUS | Status: DC | PRN
  Administered 2020-02-03: 30 mL via INTRA_ARTERIAL

## 2020-02-03 MED ORDER — PANTOPRAZOLE SODIUM 40 MG PO TBEC: 40.0000 mg | DELAYED_RELEASE_TABLET | Freq: Every day | ORAL | Status: DC

## 2020-02-03 MED ORDER — SODIUM CHLORIDE 0.9 % IV SOLN
INTRAVENOUS | Status: DC | PRN
  Administered 2020-02-03: 500 mL

## 2020-02-03 MED ORDER — FENTANYL CITRATE (PF) 250 MCG/5ML IJ SOLN
INTRAMUSCULAR | Status: DC | PRN
  Administered 2020-02-03 (×2): 50 ug via INTRAVENOUS

## 2020-02-03 MED ORDER — LISINOPRIL 40 MG PO TABS: 40.0000 mg | ORAL_TABLET | Freq: Every day | ORAL | Status: DC

## 2020-02-03 MED ORDER — ASPIRIN EC 81 MG PO TBEC: 81.0000 mg | DELAYED_RELEASE_TABLET | Freq: Every day | ORAL | Status: DC

## 2020-02-03 MED ORDER — 0.9 % SODIUM CHLORIDE (POUR BTL) OPTIME
TOPICAL | Status: DC | PRN
  Administered 2020-02-03: 1000 mL

## 2020-02-03 MED ORDER — ONDANSETRON HCL 4 MG/2ML IJ SOLN
INTRAMUSCULAR | Status: AC
  Filled 2020-02-03: qty 2

## 2020-02-03 MED ORDER — ACETAMINOPHEN 650 MG RE SUPP: 325.0000 mg | RECTAL | Status: DC | PRN

## 2020-02-03 MED ORDER — ACETAMINOPHEN 325 MG PO TABS: 325.0000 mg | ORAL_TABLET | ORAL | Status: DC | PRN

## 2020-02-03 MED ORDER — ROSUVASTATIN CALCIUM 20 MG PO TABS
40.0000 mg | ORAL_TABLET | Freq: Every day | ORAL | Status: DC
  Administered 2020-02-03: 40 mg via ORAL
  Filled 2020-02-03: qty 2

## 2020-02-03 MED ORDER — LIDOCAINE 2% (20 MG/ML) 5 ML SYRINGE
INTRAMUSCULAR | Status: DC | PRN
  Administered 2020-02-03: 100 mg via INTRAVENOUS

## 2020-02-03 MED ORDER — ROCURONIUM BROMIDE 10 MG/ML (PF) SYRINGE
PREFILLED_SYRINGE | INTRAVENOUS | Status: DC | PRN
  Administered 2020-02-03: 50 mg via INTRAVENOUS

## 2020-02-03 MED ORDER — ONDANSETRON HCL 4 MG/2ML IJ SOLN
INTRAMUSCULAR | Status: DC | PRN
  Administered 2020-02-03: 4 mg via INTRAVENOUS

## 2020-02-03 MED ORDER — DEXAMETHASONE SODIUM PHOSPHATE 10 MG/ML IJ SOLN
INTRAMUSCULAR | Status: AC
  Filled 2020-02-03: qty 1

## 2020-02-03 MED ORDER — CHLORHEXIDINE GLUCONATE 0.12 % MT SOLN
OROMUCOSAL | Status: AC
  Administered 2020-02-03: 15 mL via OROMUCOSAL
  Filled 2020-02-03: qty 15

## 2020-02-03 MED ORDER — PHENOL 1.4 % MT LIQD: 1.0000 | OROMUCOSAL | Status: DC | PRN

## 2020-02-03 MED ORDER — MIDAZOLAM HCL 2 MG/2ML IJ SOLN
INTRAMUSCULAR | Status: AC
  Filled 2020-02-03: qty 2

## 2020-02-03 MED ORDER — DEXAMETHASONE SODIUM PHOSPHATE 10 MG/ML IJ SOLN
INTRAMUSCULAR | Status: DC | PRN
  Administered 2020-02-03: 5 mg via INTRAVENOUS

## 2020-02-03 MED ORDER — SODIUM CHLORIDE 0.9 % IV SOLN
INTRAVENOUS | Status: AC
  Filled 2020-02-03: qty 1.2

## 2020-02-03 MED ORDER — MAGNESIUM SULFATE 2 GM/50ML IV SOLN: 2.0000 g | Freq: Every day | INTRAVENOUS | Status: DC | PRN

## 2020-02-03 MED ORDER — CHLORHEXIDINE GLUCONATE 0.12 % MT SOLN: 15.0000 mL | Freq: Once | OROMUCOSAL | Status: AC

## 2020-02-03 MED ORDER — METOPROLOL TARTRATE 5 MG/5ML IV SOLN: 2.0000 mg | INTRAVENOUS | Status: DC | PRN

## 2020-02-03 MED ORDER — HYDROCHLOROTHIAZIDE 25 MG PO TABS: 25.0000 mg | ORAL_TABLET | Freq: Every day | ORAL | Status: DC

## 2020-02-03 MED ORDER — BISACODYL 5 MG PO TBEC: 5.0000 mg | DELAYED_RELEASE_TABLET | Freq: Every day | ORAL | Status: DC | PRN

## 2020-02-03 MED ORDER — DOCUSATE SODIUM 100 MG PO CAPS: 100.0000 mg | ORAL_CAPSULE | Freq: Every day | ORAL | Status: DC

## 2020-02-03 MED ORDER — LACTATED RINGERS IV SOLN: INTRAVENOUS | Status: DC | PRN

## 2020-02-03 MED ORDER — SENNOSIDES-DOCUSATE SODIUM 8.6-50 MG PO TABS: 1.0000 | ORAL_TABLET | Freq: Every evening | ORAL | Status: DC | PRN

## 2020-02-03 MED ORDER — LISINOPRIL-HYDROCHLOROTHIAZIDE 20-12.5 MG PO TABS: 2.0000 | ORAL_TABLET | Freq: Every day | ORAL | Status: DC

## 2020-02-03 MED ORDER — POTASSIUM CHLORIDE CRYS ER 20 MEQ PO TBCR: 20.0000 meq | EXTENDED_RELEASE_TABLET | Freq: Every day | ORAL | Status: DC | PRN

## 2020-02-03 MED ORDER — INSULIN ASPART 100 UNIT/ML ~~LOC~~ SOLN
0.0000 [IU] | Freq: Three times a day (TID) | SUBCUTANEOUS | Status: DC
  Administered 2020-02-03: 2 [IU] via SUBCUTANEOUS
  Administered 2020-02-04: 3 [IU] via SUBCUTANEOUS

## 2020-02-03 MED ORDER — LABETALOL HCL 5 MG/ML IV SOLN: 10.0000 mg | INTRAVENOUS | Status: DC | PRN

## 2020-02-03 MED ORDER — CEFAZOLIN SODIUM-DEXTROSE 2-4 GM/100ML-% IV SOLN
2.0000 g | INTRAVENOUS | Status: AC
  Administered 2020-02-03: 2 g via INTRAVENOUS

## 2020-02-03 MED ORDER — GLYCOPYRROLATE PF 0.2 MG/ML IJ SOSY
PREFILLED_SYRINGE | INTRAMUSCULAR | Status: AC
  Filled 2020-02-03: qty 1

## 2020-02-03 MED ORDER — ALUM & MAG HYDROXIDE-SIMETH 200-200-20 MG/5ML PO SUSP: 15.0000 mL | ORAL | Status: DC | PRN

## 2020-02-03 MED ORDER — SODIUM CHLORIDE 0.9 % IV SOLN: 500.0000 mL | Freq: Once | INTRAVENOUS | Status: DC | PRN

## 2020-02-03 MED ORDER — HYDRALAZINE HCL 20 MG/ML IJ SOLN: 5.0000 mg | INTRAMUSCULAR | Status: DC | PRN

## 2020-02-03 MED ORDER — TRAMADOL HCL 50 MG PO TABS
50.0000 mg | ORAL_TABLET | Freq: Four times a day (QID) | ORAL | Status: DC | PRN
  Administered 2020-02-03: 50 mg via ORAL
  Filled 2020-02-03: qty 1

## 2020-02-03 MED ORDER — GLYCOPYRROLATE 0.2 MG/ML IJ SOLN
INTRAMUSCULAR | Status: DC | PRN
  Administered 2020-02-03: .2 mg via INTRAVENOUS

## 2020-02-03 MED ORDER — PROTAMINE SULFATE 10 MG/ML IV SOLN
INTRAVENOUS | Status: DC | PRN
  Administered 2020-02-03: 10 mg via INTRAVENOUS
  Administered 2020-02-03: 40 mg via INTRAVENOUS

## 2020-02-03 MED ORDER — HYDROMORPHONE HCL 1 MG/ML IJ SOLN: 0.5000 mg | INTRAMUSCULAR | Status: DC | PRN

## 2020-02-03 SURGICAL SUPPLY — 68 items
ADH SKN CLS APL DERMABOND .7 (GAUZE/BANDAGES/DRESSINGS) ×2
AGENT HMST SPONGE THK3/8 (HEMOSTASIS)
BAG BANDED W/RUBBER/TAPE 36X54 (MISCELLANEOUS) ×3 IMPLANT
BAG EQP BAND 135X91 W/RBR TAPE (MISCELLANEOUS) ×2
BALLN STERLING RX 4X30X80 (BALLOONS) ×3
BALLN STERLING RX 6X20X80 (BALLOONS) ×3
BALLOON STERLING RX 4X30X80 (BALLOONS) IMPLANT
BALLOON STERLING RX 6X20X80 (BALLOONS) IMPLANT
CANISTER SUCT 3000ML PPV (MISCELLANEOUS) ×3 IMPLANT
CANNULA VESSEL 3MM 2 BLNT TIP (CANNULA) ×3 IMPLANT
CATH ROBINSON RED A/P 18FR (CATHETERS) IMPLANT
CLIP VESOCCLUDE MED 6/CT (CLIP) ×3 IMPLANT
CLIP VESOCCLUDE SM WIDE 6/CT (CLIP) ×3 IMPLANT
COVER DOME SNAP 22 D (MISCELLANEOUS) ×3 IMPLANT
COVER PROBE W GEL 5X96 (DRAPES) ×3 IMPLANT
COVER WAND RF STERILE (DRAPES) ×3 IMPLANT
DECANTER SPIKE VIAL GLASS SM (MISCELLANEOUS) IMPLANT
DERMABOND ADVANCED (GAUZE/BANDAGES/DRESSINGS) ×1
DERMABOND ADVANCED .7 DNX12 (GAUZE/BANDAGES/DRESSINGS) ×2 IMPLANT
DRAIN HEMOVAC 1/8 X 5 (WOUND CARE) IMPLANT
DRAPE FEMORAL ANGIO 80X135IN (DRAPES) ×3 IMPLANT
DRAPE INCISE IOBAN 66X45 STRL (DRAPES) ×6 IMPLANT
ELECT REM PT RETURN 9FT ADLT (ELECTROSURGICAL) ×3
ELECTRODE REM PT RTRN 9FT ADLT (ELECTROSURGICAL) ×2 IMPLANT
EVACUATOR SILICONE 100CC (DRAIN) IMPLANT
GLOVE BIO SURGEON STRL SZ 6.5 (GLOVE) ×1 IMPLANT
GLOVE BIO SURGEON STRL SZ7.5 (GLOVE) ×3 IMPLANT
GOWN STRL REUS W/ TWL LRG LVL3 (GOWN DISPOSABLE) ×6 IMPLANT
GOWN STRL REUS W/TWL LRG LVL3 (GOWN DISPOSABLE) ×9
HEMOSTAT SPONGE AVITENE ULTRA (HEMOSTASIS) IMPLANT
INTRODUCER KIT GALT 7CM (INTRODUCER) ×3
KIT BASIN OR (CUSTOM PROCEDURE TRAY) ×3 IMPLANT
KIT ENCORE 26 ADVANTAGE (KITS) ×3 IMPLANT
KIT INTRODUCER GALT 7 (INTRODUCER) ×2 IMPLANT
KIT TURNOVER KIT B (KITS) ×3 IMPLANT
NDL HYPO 25GX1X1/2 BEV (NEEDLE) IMPLANT
NDL PERC 18GX7CM (NEEDLE) ×2 IMPLANT
NEEDLE HYPO 25GX1X1/2 BEV (NEEDLE) IMPLANT
NEEDLE PERC 18GX7CM (NEEDLE) ×3 IMPLANT
NS IRRIG 1000ML POUR BTL (IV SOLUTION) ×6 IMPLANT
PACK CAROTID (CUSTOM PROCEDURE TRAY) ×3 IMPLANT
POSITIONER HEAD DONUT 9IN (MISCELLANEOUS) ×3 IMPLANT
PROTECTION STATION PRESSURIZED (MISCELLANEOUS) ×3
SET MICROPUNCTURE 5F STIFF (MISCELLANEOUS) ×3 IMPLANT
SHEATH AVANTI 11CM 5FR (SHEATH) IMPLANT
SHUNT CAROTID BYPASS 10 (VASCULAR PRODUCTS) IMPLANT
SHUNT CAROTID BYPASS 12FRX15.5 (VASCULAR PRODUCTS) IMPLANT
STATION PROTECTION PRESSURIZED (MISCELLANEOUS) ×2 IMPLANT
STENT TRANSCAROTID SYS 7X40 (Permanent Stent) ×1 IMPLANT
STOPCOCK MORSE 400PSI 3WAY (MISCELLANEOUS) ×3 IMPLANT
SUT ETHILON 3 0 PS 1 (SUTURE) IMPLANT
SUT PROLENE 6 0 CC (SUTURE) ×3 IMPLANT
SUT SILK 2 0 PERMA HAND 18 BK (SUTURE) ×3 IMPLANT
SUT SILK 2 0SH CR/8 30 (SUTURE) ×3 IMPLANT
SUT SILK 3 0 TIES 17X18 (SUTURE)
SUT SILK 3-0 18XBRD TIE BLK (SUTURE) IMPLANT
SUT VIC AB 3-0 SH 27 (SUTURE) ×3
SUT VIC AB 3-0 SH 27X BRD (SUTURE) ×2 IMPLANT
SUT VICRYL 4-0 PS2 18IN ABS (SUTURE) ×3 IMPLANT
SYR 10ML LL (SYRINGE) ×9 IMPLANT
SYR 20ML LL LF (SYRINGE) ×3 IMPLANT
SYR 5ML LL (SYRINGE) ×3 IMPLANT
SYR CONTROL 10ML LL (SYRINGE) IMPLANT
TOWEL GREEN STERILE (TOWEL DISPOSABLE) ×3 IMPLANT
TUBING EXTENTION W/L.L. (IV SETS) ×3 IMPLANT
WATER STERILE IRR 1000ML POUR (IV SOLUTION) ×3 IMPLANT
WIRE AMPLATZ SS-J .035X180CM (WIRE) IMPLANT
WIRE BENTSON .035X145CM (WIRE) ×3 IMPLANT

## 2020-02-03 NOTE — Anesthesia Procedure Notes (Signed)
Procedure Name: Intubation Date/Time: 02/03/2020 11:56 AM Performed by: Kathryne Hitch, CRNA Pre-anesthesia Checklist: Patient identified, Emergency Drugs available, Suction available and Patient being monitored Patient Re-evaluated:Patient Re-evaluated prior to induction Oxygen Delivery Method: Circle system utilized Preoxygenation: Pre-oxygenation with 100% oxygen Induction Type: IV induction Ventilation: Mask ventilation without difficulty Laryngoscope Size: Glidescope and 4 Grade View: Grade I Tube type: Oral Tube size: 7.5 mm Number of attempts: 2 Airway Equipment and Method: Stylet and Oral airway Placement Confirmation: ETT inserted through vocal cords under direct vision,  positive ETCO2 and breath sounds checked- equal and bilateral Secured at: 23 cm Tube secured with: Tape Dental Injury: Teeth and Oropharynx as per pre-operative assessment  Comments: CRNA DVL x1 Grade 3 view.

## 2020-02-03 NOTE — Interval H&P Note (Signed)
History and Physical Interval Note:  02/03/2020 11:05 AM  Caleb [name]  has presented today for surgery, with the diagnosis of RIght Carotid Artery Stenosis.  The various methods of treatment have been discussed with the patient and family. After consideration of risks, benefits and other options for treatment, the patient has consented to  Procedure(s): RIGHT TRANSCAROTID ARTERY REVASCULARIZATION (Right) as a surgical intervention.  The patient's history has been reviewed, patient examined, no change in status, stable for surgery.  I have reviewed the patient's chart and labs.  Questions were answered to the patient's satisfaction.     Ruta Hinds

## 2020-02-03 NOTE — Transfer of Care (Signed)
Immediate Anesthesia Transfer of Care Note  Patient: Caleb [name]  Procedure(s) Performed: RIGHT TRANSCAROTID ARTERY REVASCULARIZATION (Right Neck) ULTRASOUND GUIDANCE FOR VASCULAR ACCESS (Right Groin)  Patient Location: PACU  Anesthesia Type:General  Level of Consciousness: drowsy and patient cooperative  Airway & Oxygen Therapy: Patient Spontanous Breathing and Patient connected to nasal cannula oxygen  Post-op Assessment: Report given to RN and Post -op Vital signs reviewed and stable  Post vital signs: Reviewed and stable  Last Vitals:  Vitals Value Taken Time  BP 101/69 02/03/20 1331  Temp    Pulse 91 02/03/20 1332  Resp 20 02/03/20 1332  SpO2 96 % 02/03/20 1332  Vitals shown include unvalidated device data.  Last Pain:  Vitals:   02/03/20 0820  PainSc: 0-No pain      Patients Stated Pain Goal: 4 (36/72/55 0016)  Complications: No complications documented.

## 2020-02-03 NOTE — Progress Notes (Signed)
   Doing well speech clear No tongue deviation, no facial droop, moving all 4 ext. Neck incision healing well without hematoma  S/P TCAR Stable disposition  Roxy Horseman PA-C

## 2020-02-03 NOTE — Op Note (Signed)
Procedure: Right transcarotid revascularization (TCAR), 7 x 40 stent  Preoperative diagnosis: Greater than 80% right internal carotid artery stenosis  Postoperative diagnosis: Same  Anesthesia: General  Assistant: Gerri Lins, PA-C for manipulation of wires and expediting procedure  Operative findings: Greater than 80% stenosis right internal carotid artery stented to 0% residual stenosis 7 x 40 enroute stent  Operative details: After team informed consent, the patient taken to the room 16.  Patient was placed in supine position on the operating table.  After induction general anesthesia endotracheal intubation patient's right neck and chest and both groins were prepped and draped in usual sterile fashion.  An introducer needle was used to cannulate the right common femoral vein.  035 Bentson wire was advanced up into the vena cava and the venous sheath for the TCAR filtration system advanced over the guidewire into the right femoral vein.  This was thoroughly flushed with heparinized saline.  Next a transverse incision was made the base of the right neck over the heads of the sternocleidomastoid muscle.  Incision was carried on through the subcutaneous tissues and through the platysma.  Incision was deepened through the heads of the sternocleidomastoid muscles and the common carotid artery was identified.  Care was taken to stay right on the outer aspect of the common carotid artery to protect all nerves.  It was dissected free circumferentially and a vessel loop placed around this.  The patient was given 3 boluses of heparin to achieve an ACT greater than 250.  This was a total of 13,000 units of heparin.  A 6-0 Prolene was used to create a pursestring stitch in the common carotid artery.  Next a micropuncture needle was used to cannulate common carotid artery and the micropuncture wire advanced 3 cm in the artery.  Micropuncture sheath was then advanced over this.  Contrast angiogram was obtained  in AP and a proximately 30 degree RAO projection.  This showed no evidence of dissection in the artery.  There was a lesion at the internal carotid artery greater than 80%.  Next the Amplatz carotid wire was advanced through the micropuncture sheath using roadmapping to stay below the carotid bifurcation.  The arterial filtration sheath was then advanced over the guidewire while holding gentle traction on the common carotid artery.  Contrast angiogram was then again obtained in 2 views to make sure there was no evidence of dissection.  The arterial and venous sheaths were sutured to the skin.  The filtration system was hooked up from the arterial to the venous system and this was confirmed to have good flow on high flow.  The 014 wire was then advanced across the lesion up to the petrous portion of the carotid artery.  A 5 x 2 angioplasty balloon was then advanced over the 014 wire and this was used to selectively catheterize the internal carotid artery.  The balloon was then inflated to 6 atm for approximately 40 seconds.  Heart rate dropped from the 70s to the high 40s.  This quickly recovered with deflation of the balloon.  We then centered a 7 x 40 on route stent of the lesion and deployed this in a pinch pull fashion.  There was still a significant waist on the central portion of the stent so this was angioplastied with a 6 x 40 balloon for initially 30 seconds.  There was still residual waist we did a second inflation for approximately 1 minute until the heart rate started to decline slightly.  We then  deflated this and the heart rate recovered.  Contrast angiogram was performed in 2 views which showed full deployment of the stent with no residual stenosis no evidence of dissection.  Guidewire was then removed the filtration system was stopped patient's blood was returned to him.  The arterial sheath was then removed and the hole repaired with the previously placed pursestring stitch.  Patient was given 50 mg  of protamine.  The venous sheath was pulled and hemostasis obtained with direct pressure.  Neck incision was closed with a layer of running 3-0 Vicryl suture in the platysma and 4-0 Vicryl subcuticular stitch in the skin.  Patient tired procedure well and there were no complications.  Insert sponge needle counts correct in the case.  Patient was taken the recovery room moving upper extremity and lower extremity motor symmetrically.  Caleb Hinds, MD [facility] Office: [phone]

## 2020-02-03 NOTE — Discharge Instructions (Signed)
   [facility]  Discharge Instructions   Carotid Endarterectomy (CEA)  Please refer to the following instructions for your post-procedure care. Your surgeon or physician assistant will discuss any changes with you.  Activity  You are encouraged to walk as much as you can. You can slowly return to normal activities but must avoid strenuous activity and heavy lifting until your doctor tell you it's OK. Avoid activities such as vacuuming or swinging a golf club. You can drive after one week if you are comfortable and you are no longer taking prescription pain medications. It is normal to feel tired for serval weeks after your surgery. It is also normal to have difficulty with sleep habits, eating, and bowel movements after surgery. These will go away with time.  Bathing/Showering  You may shower after you come home. Do not soak in a bathtub, hot tub, or swim until the incision heals completely.  Incision Care  Shower every day. Clean your incision with mild soap and water. Pat the area dry with a clean towel. You do not need a bandage unless otherwise instructed. Do not apply any ointments or creams to your incision. You may have skin glue on your incision. Do not peel it off. It will come off on its own in about one week. Your incision may feel thickened and raised for several weeks after your surgery. This is normal and the skin will soften over time. For Men Only: It's OK to shave around the incision but do not shave the incision itself for 2 weeks. It is common to have numbness under your chin that could last for several months.  Diet  Resume your normal diet. There are no special food restrictions following this procedure. A low fat/low cholesterol diet is recommended for all patients with vascular disease. In order to heal from your surgery, it is CRITICAL to get adequate nutrition. Your body requires vitamins, minerals, and protein. Vegetables are the best  source of vitamins and minerals. Vegetables also provide the perfect balance of protein. Processed food has little nutritional value, so try to avoid this.        Medications  Resume taking all of your medications unless your doctor or physician assistant tells you not to. If your incision is causing pain, you may take over-the- counter pain relievers such as acetaminophen (Tylenol). If you were prescribed a stronger pain medication, please be aware these medications can cause nausea and constipation. Prevent nausea by taking the medication with a snack or meal. Avoid constipation by drinking plenty of fluids and eating foods with a high amount of fiber, such as fruits, vegetables, and grains. Do not take Tylenol if you are taking prescription pain medications.  Follow Up  Our office will schedule a follow up appointment 2-3 weeks following discharge.  Please call us immediately for any of the following conditions  Increased pain, redness, drainage (pus) from your incision site. Fever of 101 degrees or higher. If you should develop stroke (slurred speech, difficulty swallowing, weakness on one side of your body, loss of vision) you should call 911 and go to the nearest emergency room.  Reduce your risk of vascular disease:  Stop smoking. If you would like help call [facility] at 1-800-QUIT-NOW (1-800-784-8669) or [facility] at 336-586-4000. Manage your cholesterol Maintain a desired weight Control your diabetes Keep your blood pressure down  If you have any questions, please call the office at [phone].   

## 2020-02-03 NOTE — Progress Notes (Signed)
Pt received from PACU to 4e01. Oriented x4. Pt connected to wall monitor and CCMD called. VSS. CHG bath complete. Call bell in reach. Will continue to monitor.  Arletta Bale, RN

## 2020-02-03 NOTE — Anesthesia Procedure Notes (Signed)
Arterial Line Insertion Start/End11/15/2021 9:50 AM, 02/03/2020 9:56 AM Performed by: Wilburn Cornelia, CRNA, CRNA  Patient location: Pre-op. Preanesthetic checklist: patient identified, IV checked, site marked, risks and benefits discussed, surgical consent, monitors and equipment checked, pre-op evaluation, timeout performed and anesthesia consent Lidocaine 1% used for infiltration Left was placed Catheter size: 20 G  Attempts: 1 Procedure performed without using ultrasound guided technique. Following insertion, Biopatch and dressing applied. Post procedure assessment: normal  Patient tolerated the procedure well with no immediate complications.

## 2020-02-04 ENCOUNTER — Encounter (HOSPITAL_COMMUNITY): Payer: Self-pay | Admitting: Vascular Surgery

## 2020-02-04 ENCOUNTER — Other Ambulatory Visit (HOSPITAL_COMMUNITY): Payer: Self-pay | Admitting: Physician Assistant

## 2020-02-04 LAB — BASIC METABOLIC PANEL
Anion gap: 10 (ref 5–15)
BUN: 32 mg/dL — ABNORMAL HIGH (ref 8–23)
CO2: 23 mmol/L (ref 22–32)
Calcium: 8.3 mg/dL — ABNORMAL LOW (ref 8.9–10.3)
Chloride: 102 mmol/L (ref 98–111)
Creatinine, Ser: 1.43 mg/dL — ABNORMAL HIGH (ref 0.61–1.24)
GFR, Estimated: 53 mL/min — ABNORMAL LOW (ref >60)
Glucose, Bld: 179 mg/dL — ABNORMAL HIGH (ref 70–99)
Potassium: 4.4 mmol/L (ref 3.5–5.1)
Sodium: 135 mmol/L (ref 135–145)

## 2020-02-04 LAB — CBC
HCT: 33.7 % — ABNORMAL LOW (ref 39.0–52.0)
Hemoglobin: 11 g/dL — ABNORMAL LOW (ref 13.0–17.0)
MCH: 27.6 pg (ref 26.0–34.0)
MCHC: 32.6 g/dL (ref 30.0–36.0)
MCV: 84.7 fL (ref 80.0–100.0)
Platelets: 237 10*3/uL (ref 150–400)
RBC: 3.98 MIL/uL — ABNORMAL LOW (ref 4.22–5.81)
RDW: 14.6 % (ref 11.5–15.5)
WBC: 6.9 10*3/uL (ref 4.0–10.5)
nRBC: 0 % (ref 0.0–0.2)

## 2020-02-04 LAB — GLUCOSE, CAPILLARY: Glucose-Capillary: 168 mg/dL — ABNORMAL HIGH (ref 70–99)

## 2020-02-04 MED ORDER — TRAMADOL HCL 50 MG PO TABS: 50.0000 mg | ORAL_TABLET | Freq: Four times a day (QID) | ORAL | 0 refills | Status: DC | PRN

## 2020-02-04 MED FILL — traMADol HCL 50 MG TABS: 50 | 3 days supply | Qty: 12 | Fill #0

## 2020-02-04 NOTE — Anesthesia Postprocedure Evaluation (Signed)
Anesthesia Post Note  Patient: Caleb [name]  Procedure(s) Performed: RIGHT TRANSCAROTID ARTERY REVASCULARIZATION (Right Neck) ULTRASOUND GUIDANCE FOR VASCULAR ACCESS (Right Groin)     Patient location during evaluation: PACU Anesthesia Type: General Level of consciousness: sedated and patient cooperative Pain management: pain level controlled Vital Signs Assessment: post-procedure vital signs reviewed and stable Respiratory status: spontaneous breathing Cardiovascular status: stable Anesthetic complications: no   No complications documented.  Last Vitals:  Vitals:   02/04/20 0523 02/04/20 0718  BP: 117/76 111/68  Pulse: 60 (!) 59  Resp: 16 17  Temp: 36.8 C (!) 36.4 C  SpO2: 98% 100%    Last Pain:  Vitals:   02/04/20 0718  TempSrc: Oral  PainSc:                  Nolon Nations

## 2020-02-04 NOTE — Progress Notes (Addendum)
[facility]  Subjective  - Doing very well without new complaints.   Objective 117/76 60 98.2 F (36.8 C) (Oral) 16 98%  Intake/Output Summary (Last 24 hours) at 02/04/2020 0704 Last data filed at 02/03/2020 2323 Gross per 24 hour  Intake 1720 ml  Output 850 ml  Net 870 ml    Moving all 4 ext. No tongue deviation and no facial droop Right neck incision healing well without hematoma Heart RRR Lungs non labored breathing Speech clear  Assessment/Planning: POD # 1 TCAR  No neurologic deficits He has ambulated, tolerating PO's without dysphasia. Plan for discharge home Pain medication will be sent to transition of care, he normally uses mail order. F/U with Dr. Oneida Alar 3-4 weeks office will call.  Caleb [name] 02/04/2020 7:04 AM -- No hematoma. Neck incision clean. Neuro UE LE 5/5 motor Agree with above. Plavix ASA statin D/c home  Ruta Hinds, MD [facility]: [phone]  Laboratory Lab Results: Recent Labs    02/03/20 1527 02/04/20 0500  WBC 6.4 6.9  HGB 12.8* 11.0*  HCT 39.4 33.7*  PLT 253 237   BMET Recent Labs    02/03/20 1527 02/04/20 0500  NA  --  135  K  --  4.4  CL  --  102  CO2  --  23  GLUCOSE  --  179*  BUN  --  32*  CREATININE 1.42* 1.43*  CALCIUM  --  8.3*    COAG Lab Results  Component Value Date   INR 0.9 01/30/2020   INR 1.0 12/11/2019   No results found for: PTT

## 2020-02-04 NOTE — Progress Notes (Signed)
Discharge instructions given to patient. IV removed, clean and intact. Medications and wound care reviewed. All questions answered. Pt escorted home by cousin.  Arletta Bale, RN

## 2020-02-05 ENCOUNTER — Other Ambulatory Visit: Payer: Self-pay

## 2020-02-05 ENCOUNTER — Telehealth: Payer: Self-pay

## 2020-02-05 NOTE — Discharge Summary (Signed)
[facility] Discharge Summary   Patient ID:  Caleb [name] MRN: [id number] DOB/AGE: 69-Jan-1952 69 y.o.  Admit date: 02/03/2020 Discharge date: 02/04/2020 Date of Surgery: 02/03/2020 Surgeon: Surgeon(s): Elam Dutch, MD  Admission Diagnosis: Carotid stenosis, asymptomatic [I65.29]  Discharge Diagnoses:  Carotid stenosis, asymptomatic [I65.29]  Secondary Diagnoses: Past Medical History:  Diagnosis Date  . Aortic stenosis   . Carotid artery occlusion   . Diabetes mellitus without complication ([place])   . Dyslipidemia 09/27/2012  . Erectile dysfunction 09/27/2012  . Heart murmur   . Hypercholesteremia   . Hyperlipidemia 08/12/2012  . Hypertension   . Hyponatremia 08/14/2012  . Neuropathy   . Pancreatitis   . Pancreatitis, acute 09/27/2012  . Peripheral vascular disease ([place])   . Vitamin D deficiency 06/23/2015    Procedure(s): RIGHT TRANSCAROTID ARTERY REVASCULARIZATION ULTRASOUND GUIDANCE FOR VASCULAR ACCESS  Discharged Condition: stable  HPI: 69 year old male who  underwent left TCAR carotid stenting December 16, 2019.  He also recently underwent left leg stenting by Dr. Gwenlyn Found in late August for a nonhealing wound.  The foot has now healed.  The patient has no symptoms of TIA amaurosis or stroke.  He continues his Plavix aspirin and statin.  Patient had a duplex ultrasound today which shows greater than 80% right internal carotid artery stenosis but a patent vessel.  He was scheduled for right ICA intervention with TCAR procedure.  Hospital Course:  Caleb [name] is a 69 y.o. male is S/P Right Procedure(s): RIGHT TRANSCAROTID ARTERY REVASCULARIZATION ULTRASOUND GUIDANCE FOR VASCULAR ACCESS  His over night stay was uneventful.  He has no neurologic deficits.  No tongue deviation or facial droop.  He is moving all 4 extremities ambulating and tolerating PO's.  No hematoma. Neck incision clean.  Discharged on Plavix ASA statin.   Significant  Diagnostic Studies: CBC Lab Results  Component Value Date   WBC 6.9 02/04/2020   HGB 11.0 (L) 02/04/2020   HCT 33.7 (L) 02/04/2020   MCV 84.7 02/04/2020   PLT 237 02/04/2020    BMET    Component Value Date/Time   NA 135 02/04/2020 0500   NA 138 11/15/2019 1139   K 4.4 02/04/2020 0500   CL 102 02/04/2020 0500   CO2 23 02/04/2020 0500   GLUCOSE 179 (H) 02/04/2020 0500   BUN 32 (H) 02/04/2020 0500   BUN 12 11/15/2019 1139   CREATININE 1.43 (H) 02/04/2020 0500   CREATININE 0.86 06/06/2016 1455   CALCIUM 8.3 (L) 02/04/2020 0500   GFRNONAA 53 (L) 02/04/2020 0500   GFRNONAA 87 04/02/2013 1632   GFRAA >60 12/17/2019 0354   GFRAA >89 04/02/2013 1632   COAG Lab Results  Component Value Date   INR 0.9 01/30/2020   INR 1.0 12/11/2019     Disposition:  Discharge to :Home Discharge Instructions    Call MD for:  redness, tenderness, or signs of infection (pain, swelling, bleeding, redness, odor or green/yellow discharge around incision site)   Complete by: As directed    Call MD for:  severe or increased pain, loss or decreased feeling  in affected limb(s)   Complete by: As directed    Call MD for:  temperature >100.5   Complete by: As directed    Resume previous diet   Complete by: As directed      Allergies as of 02/04/2020      Reactions   Percocet [oxycodone-acetaminophen] Nausea And Vomiting   10/325. Pt has 3 doses of this medication. N/V,  temperature 97, no chills and slight sweating. Pt dressing was C/D/I no drainage noted.       Medication List    STOP taking these medications   Acetaminophen-Codeine 300-30 MG tablet Commonly known as: TYLENOL/CODEINE #3     TAKE these medications   aspirin EC 81 MG tablet Take 81 mg by mouth daily. Notes to patient: Take as you were at home.   clopidogrel 75 MG tablet Commonly known as: Plavix Take 1 tablet (75 mg total) by mouth daily. Notes to patient: Take as you were at home.   collagenase ointment Commonly  known as: SANTYL Apply 1 application topically daily. Apply Santyl Ointment to scab on left 3rd and 4th digits. Measurements: Left 3rd toe 1.0 x 0.6 cm Left 4th toe: 2.0 x 1.0 cm Notes to patient: Take as you were at home.   dapagliflozin propanediol 5 MG Tabs tablet Commonly known as: Farxiga Take 1 tablet (5 mg total) by mouth daily before breakfast. Notes to patient: Take as you were at home.   FreeStyle Libre 14 Day Reader Kerrin Mo 1 each by Does not apply route See admin instructions. Use as directed to check blood glucose Notes to patient: Take as you were at home.   FreeStyle Libre 14 Day Sensor Misc INJECT IN THE SKIN EVERY 14 (FOURTEEN) DAYS. E11.69 Z79.4 Notes to patient: Take as you were at home.   gabapentin 300 MG capsule Commonly known as: NEURONTIN Take 1 capsule (300 mg total) by mouth 2 (two) times daily. What changed:   when to take this  reasons to take this Notes to patient: Take as you were at home.   glucose blood test strip Use as instructed twice daily, check fasting sugar in morning and check before bedtime. E11.8 Notes to patient: Take as you were at home.   Insulin Syringe-Needle U-100 31G X 5/16" 0.3 ML Misc Commonly known as: TRUEplus Insulin Syringe Use as instructed to inject insulin daily. Notes to patient: Take as you were at home.   lisinopril-hydrochlorothiazide 20-12.5 MG tablet Commonly known as: Zestoretic Take 2 tablets by mouth daily. Notes to patient: Take as you were at home.   metFORMIN 1000 MG tablet Commonly known as: Glucophage Take 1 tablet (1,000 mg total) by mouth 2 (two) times daily with a meal. Notes to patient: Take as you were at home.   metoprolol tartrate 100 MG tablet Commonly known as: LOPRESSOR Take 1 tablet (100 mg total) by mouth 2 (two) times daily. Notes to patient: Take as you were at home.   NovoLIN 70/30 (70-30) 100 UNIT/ML injection Generic drug: insulin NPH-regular Human Inject 10 Units into the skin 2  (two) times daily with a meal. Titrate by 2 units twice daily every fourth day until blood sugars are at goal.  Maximum of 20 units twice daily Notes to patient: Take as you were at home.   ondansetron 4 MG tablet Commonly known as: Zofran Take 1 tablet (4 mg total) by mouth every 8 (eight) hours as needed for nausea or vomiting. Notes to patient: Take as you were at home.   OneTouch Delica Lancets 10G Misc Use as instructed twice daily, check fasting sugar in morning and check before bedtime. E11.8 Notes to patient: Take as you were at home.   OneTouch Verio w/Device Kit 1 each by Does not apply route 2 (two) times daily. Use as instructed twice daily, check fasting sugar in morning and check before bedtime. E11.8 Notes to patient: Take as  you were at home.   rosuvastatin 40 MG tablet Commonly known as: CRESTOR Take 1 tablet (40 mg total) by mouth daily. To lower cholesterol Notes to patient: Take as you were at home.   sildenafil 50 MG tablet Commonly known as: VIAGRA TAKE 2 TABLETS BY MOUTH DAILY AS NEEDED FOR ERECTILE DYSFUNCTION What changed:   how much to take  how to take this  when to take this  reasons to take this  additional instructions Notes to patient: Take as you were at home.   silver sulfADIAZINE 1 % cream Commonly known as: SILVADENE Apply to affected toes once daily and cover with dressing. Notes to patient: Take as you were at home.   traMADol 50 MG tablet Commonly known as: ULTRAM Take 1 tablet (50 mg total) by mouth every 6 (six) hours as needed for moderate pain. Notes to patient: Take as needed for pain.      Verbal and written Discharge instructions given to the patient. Wound care per Discharge AVS  Follow-up Information    Elam Dutch, MD In 2 weeks.   Specialties: Vascular Surgery, Cardiology Why: Office will call you to arrange your appt (sent) Contact information: [place] [address] [phone]                Signed: Roxy Horseman 02/05/2020, 11:13 AM   --- For VQI Registry use --- Instructions: Press F2 to tab through selections.  Delete question if not applicable.   Modified Rankin score at D/C (0-6): Rankin Score=0  IV medication needed for:  1. Hypertension: No 2. Hypotension: No  Post-op Complications: No  1. Post-op CVA or TIA: No  If yes: Event classification (right eye, left eye, right cortical, left cortical, verterobasilar, other):   If yes: Timing of event (intra-op, <6 hrs post-op, >=6 hrs post-op, unknown):   2. CN injury: No  If yes: CN  injuried   3. Myocardial infarction: No  If yes: Dx by (EKG or clinical, Troponin):   4.  CHF: No  5.  Dysrhythmia (new): No  6. Wound infection: No  7. Reperfusion symptoms: No  8. Return to OR: No  If yes: return to OR for (bleeding, neurologic, other CEA incision, other):   Discharge medications: Statin use:  Yes ASA use:  Yes Beta blocker use:  Yes ACE-Inhibitor use:  Yes P2Y12 Antagonist use: [ ] None, [x ] Plavix, [ ] Plasugrel, [ ] Ticlopinine, [ ] Ticagrelor, [ ] Other, [ ] No for medical reason, [ ] Non-compliant, [ ] Not-indicated Anti-coagulant use:  [x ] None, [ ] Warfarin, [ ] Rivaroxaban, [ ] Dabigatran, [ ] Other, [ ] No for medical reason, [ ] Non-compliant, [ ] Not-indicated

## 2020-02-05 NOTE — Telephone Encounter (Signed)
Transition Care Management Follow-up Telephone Call  Date of discharge and from where: 02/04/2020, [facility]  How have you been since you were released from the hospital? He said he is " feeling good."  Anxious to get back to working out at the gym but understands that he needs to take it easy for now.   Any questions or concerns? No  Items Reviewed:  Did the pt receive and understand the discharge instructions provided? Yes   Medications obtained and verified? Yes  he said he has all medications and did not have any questions about his med regime.  He currently takes novolin 70/30 10 units twice daily and he stated that his blood sugars have been well maintained with this dose.   Other? No   Any new allergies since your discharge? No   Do you have support at home? Yes , his daughter.   Home Care and Equipment/Supplies: Were home health services ordered?no If so, what is the name of the agency? n/a Has the agency set up a time to come to the patient's home? n/a Were any new equipment or medical supplies ordered?  No What is the name of the medical supply agency? n/a Were you able to get the supplies/equipment?n/a.  Wound care for his left foot/toes is noted on the AVS but he said that he does not have any open areas on his feet.   Do you have any questions related to the use of the equipment or supplies? No - n/a Has Freestyle Libre- he said that he checks his blood sugar often.  This morning it was 114.   Functional Questionnaire: (I = Independent and D = Dependent) ADLs: independent.   Follow up appointments reviewed:   PCP Hospital f/u appt confirmed? Yes  - appointment scheduled with Dr Caleb [name] 02/26/2020. He said that he just started the farxiga about a week ago and wanted to wait a couple of weeks before seeing her.   [name] f/u appt confirmed? Yes [facility] - 02/27/2020; [facility] - 12/14/2021Are transportation arrangements needed? No   If their condition  worsens, is the pt aware to call PCP or go to the Emergency Dept.? yes  Was the patient provided with contact information for the PCP's office or ED? He has the phone number for the clinic  Was to pt encouraged to call back with questions or concerns? yes

## 2020-02-05 NOTE — Patient Outreach (Signed)
[facility]) Care Management  02/05/2020  Caleb [name] 1950/11/10 [id number]     Transition of Care Referral  Referral Date: 02/05/2020 Referral Source: [facility] Discharge Report Date of Discharge: 02/04/2020 Facility: [facility]: [facility]   Referral received. Transition of care calls being completed via EMMI-automated calls. RN CM will outreach patient for any red flags received.      Plan: RN CM will close case at this time.    Caleb Montgomery, RN,BSN,CCM [facility] Management Telephonic Care Management Coordinator Direct Phone: [phone] Toll Free: [phone] Fax: [phone]

## 2020-02-05 NOTE — Telephone Encounter (Signed)
From the discharge call:  He said he is " feeling good."  Anxious to get back to working out at the gym but understands that he needs to take it easy for now.   Yes  he said he has all medications and did not have any questions about his med regime.  He currently takes novolin 70/30 10 units twice daily and he stated that his blood sugars have been well maintained with this dose.   Wound care for his left foot/toes is noted on the AVS but he said that he does not have any open areas on his feet.    Has [employer]- he said that he checks his blood sugar often.  This morning it was 114.   appointment scheduled with Dr Margarita Rana 02/26/2020. He said that he just started the farxiga about a week ago and wanted to wait a couple of weeks before seeing her.

## 2020-02-07 LAB — TYPE AND SCREEN
ABO/RH(D): O POS
Antibody Screen: POSITIVE
DAT, IgG: POSITIVE
Unit division: 0
Unit division: 0

## 2020-02-07 LAB — BPAM RBC
Blood Product Expiration Date: 202112142359
Blood Product Expiration Date: 202112142359
Unit Type and Rh: 5100
Unit Type and Rh: 5100

## 2020-02-20 ENCOUNTER — Other Ambulatory Visit: Payer: Self-pay

## 2020-02-20 DIAGNOSIS — I6523 Occlusion and stenosis of bilateral carotid arteries: Secondary | ICD-10-CM

## 2020-02-25 ENCOUNTER — Ambulatory Visit: Payer: Medicare HMO | Admitting: Family Medicine

## 2020-02-26 ENCOUNTER — Other Ambulatory Visit: Payer: Self-pay

## 2020-02-26 ENCOUNTER — Encounter: Payer: Self-pay | Admitting: Family Medicine

## 2020-02-26 ENCOUNTER — Ambulatory Visit: Payer: Medicare HMO | Attending: Family Medicine | Admitting: Family Medicine

## 2020-02-26 VITALS — BP 120/72 | HR 84 | Ht 68.0 in | Wt 183.2 lb

## 2020-02-26 DIAGNOSIS — Z23 Encounter for immunization: Secondary | ICD-10-CM | POA: Diagnosis not present

## 2020-02-26 DIAGNOSIS — I110 Hypertensive heart disease with heart failure: Secondary | ICD-10-CM | POA: Diagnosis not present

## 2020-02-26 DIAGNOSIS — I5042 Chronic combined systolic (congestive) and diastolic (congestive) heart failure: Secondary | ICD-10-CM | POA: Diagnosis not present

## 2020-02-26 DIAGNOSIS — E1169 Type 2 diabetes mellitus with other specified complication: Secondary | ICD-10-CM

## 2020-02-26 DIAGNOSIS — I152 Hypertension secondary to endocrine disorders: Secondary | ICD-10-CM | POA: Diagnosis not present

## 2020-02-26 DIAGNOSIS — I10 Essential (primary) hypertension: Secondary | ICD-10-CM

## 2020-02-26 DIAGNOSIS — I779 Disorder of arteries and arterioles, unspecified: Secondary | ICD-10-CM | POA: Diagnosis not present

## 2020-02-26 DIAGNOSIS — S0083XA Contusion of other part of head, initial encounter: Secondary | ICD-10-CM

## 2020-02-26 DIAGNOSIS — E1159 Type 2 diabetes mellitus with other circulatory complications: Secondary | ICD-10-CM | POA: Diagnosis not present

## 2020-02-26 DIAGNOSIS — Z794 Long term (current) use of insulin: Secondary | ICD-10-CM | POA: Diagnosis not present

## 2020-02-26 MED ORDER — NOVOLIN 70/30 (70-30) 100 UNIT/ML ~~LOC~~ SUSP: 12.0000 [IU] | Freq: Two times a day (BID) | SUBCUTANEOUS | 6 refills | Status: DC

## 2020-02-26 MED ORDER — LISINOPRIL-HYDROCHLOROTHIAZIDE 20-12.5 MG PO TABS: 2.0000 | ORAL_TABLET | Freq: Every day | ORAL | 0 refills | Status: DC

## 2020-02-26 NOTE — Progress Notes (Signed)
Subjective:  Patient ID: Caleb [name], male    DOB: 05-Aug-1950  Age: 69 y.o. MRN: [id number]  CC: Hospitalization Follow-up   HPI HATTIE AGUINALDO is a 69 year old male with a history of type 2 diabetes mellitus (A1c7.7), hypertension, hyperlipidemia, s/p L 4th and 5th  toe metatarsal amputation, PAD status post b/l popliteal artery intervention, bilateral carotid artery stenosis status post bilateral  transcarotid artery revascularization (in 11/2019 and 01/2020), CHF (EF 40 to 45% from echo of 08/2019, grade 1 DD, LVH, LV global hypokinesis, moderate to severe aortic valve stenosis) who presents today for a follow-up visit.  On 02/03/2020 he underwent right cardiac arteries revascularization due to bilateral carotid artery stenosis and postop course was uneventful Appointment with Vascular comes up tomorrow. He worked out yesterday and 'overdid it', got dehydrated and fell on his face bruising his nose. He received Gatorade and did better after; feels good today.  His daughter assisted him in cleaning up the bruise and he denies any bleeding or pain at this time.  With regards to his blood sugars his fasting blood sugars range from 110-125 and he is compliant with his current medication.  Wilder Glade cost him about $95 but this is not a problem for him as he states all his other medications are free and he would like to remain on Farxiga.  Denies presence of hypoglycemia.  Up-to-date on annual eye exams. He will be moving to [facility] in 10/2020  Past Medical History:  Diagnosis Date  . Aortic stenosis   . Carotid artery occlusion   . Diabetes mellitus without complication ([place])   . Dyslipidemia 09/27/2012  . Erectile dysfunction 09/27/2012  . Heart murmur   . Hypercholesteremia   . Hyperlipidemia 08/12/2012  . Hypertension   . Hyponatremia 08/14/2012  . Neuropathy   . Pancreatitis   . Pancreatitis, acute 09/27/2012  . Peripheral vascular disease ([place])   . Vitamin D deficiency 06/23/2015    Past  Surgical History:  Procedure Laterality Date  . ABDOMINAL AORTOGRAM W/LOWER EXTREMITY N/A 08/08/2019   Procedure: ABDOMINAL AORTOGRAM W/LOWER EXTREMITY;  Surgeon: Lorretta Harp, MD;  Location: [facility];  Service: Cardiovascular;  Laterality: N/A;  . ABDOMINAL AORTOGRAM W/LOWER EXTREMITY N/A 11/18/2019   Procedure: ABDOMINAL AORTOGRAM W/LOWER EXTREMITY;  Surgeon: Lorretta Harp, MD;  Location: [facility];  Service: Cardiovascular;  Laterality: N/A;  . COLONOSCOPY  12 years ago   in [place] clinic= normal exam per pt  . KNEE SURGERY    . PERIPHERAL VASCULAR INTERVENTION Right 08/08/2019   Procedure: PERIPHERAL VASCULAR INTERVENTION;  Surgeon: Lorretta Harp, MD;  Location: [facility];  Service: Cardiovascular;  Laterality: Right;  . PERIPHERAL VASCULAR INTERVENTION Left 11/18/2019   Procedure: PERIPHERAL VASCULAR INTERVENTION;  Surgeon: Lorretta Harp, MD;  Location: [facility];  Service: Cardiovascular;  Laterality: Left;  left popliteal artery  . TRANSCAROTID ARTERY REVASCULARIZATION Left 12/16/2019   Procedure: TRANSCAROTID ARTERY REVASCULARIZATION;  Surgeon: Elam Dutch, MD;  Location: [facility];  Service: Vascular;  Laterality: Left;  . TRANSCAROTID ARTERY REVASCULARIZATION Right 02/03/2020   Procedure: RIGHT TRANSCAROTID ARTERY REVASCULARIZATION;  Surgeon: Elam Dutch, MD;  Location: [facility];  Service: Vascular;  Laterality: Right;  . ULTRASOUND GUIDANCE FOR VASCULAR ACCESS Right 12/16/2019   Procedure: ULTRASOUND GUIDANCE FOR VASCULAR ACCESS;  Surgeon: Elam Dutch, MD;  Location: [facility];  Service: Vascular;  Laterality: Right;  . ULTRASOUND GUIDANCE FOR VASCULAR ACCESS Right 02/03/2020  Procedure: ULTRASOUND GUIDANCE FOR VASCULAR ACCESS;  Surgeon: Elam Dutch, MD;  [facility];  Service: Vascular;  Laterality: Right;    Family History  Problem Relation Age of Onset  . CAD Father   . Hypertension Father   . Alcohol abuse  Father        Cause of death  . Diabetes Mother   . Colon polyps Mother   . CAD Brother 61       CABG  . Colon cancer Neg Hx   . Esophageal cancer Neg Hx   . Rectal cancer Neg Hx   . Stomach cancer Neg Hx     Allergies  Allergen Reactions  . Percocet [Oxycodone-Acetaminophen] Nausea And Vomiting    10/325. Pt has 3 doses of this medication. N/V, temperature 97, no chills and slight sweating. Pt dressing was C/D/I no drainage noted.     Outpatient Medications Prior to Visit  Medication Sig Dispense Refill  . aspirin EC 81 MG tablet Take 81 mg by mouth daily.    . Blood Glucose Monitoring Suppl (ONETOUCH VERIO) w/Device KIT 1 each by Does not apply route 2 (two) times daily. Use as instructed twice daily, check fasting sugar in morning and check before bedtime. E11.8 1 kit 0  . clopidogrel (PLAVIX) 75 MG tablet Take 1 tablet (75 mg total) by mouth daily. 90 tablet 1  . Continuous Blood Gluc Receiver (FREESTYLE LIBRE 14 DAY READER) DEVI 1 each by Does not apply route See admin instructions. Use as directed to check blood glucose 1 each 0  . Continuous Blood Gluc Sensor (FREESTYLE LIBRE 14 DAY SENSOR) MISC INJECT IN THE SKIN EVERY 14 (FOURTEEN) DAYS. E11.69 Z79.4 6 each 3  . dapagliflozin propanediol (FARXIGA) 5 MG TABS tablet Take 1 tablet (5 mg total) by mouth daily before breakfast. 90 tablet 1  . gabapentin (NEURONTIN) 300 MG capsule Take 1 capsule (300 mg total) by mouth 2 (two) times daily. (Patient taking differently: Take 300 mg by mouth 2 (two) times daily as needed (pain). ) 180 capsule 1  . glucose blood test strip Use as instructed twice daily, check fasting sugar in morning and check before bedtime. E11.8 100 each 12  . Insulin Syringe-Needle U-100 (TRUEPLUS INSULIN SYRINGE) 31G X 5/16" 0.3 ML MISC Use as instructed to inject insulin daily. 100 each 2  . metFORMIN (GLUCOPHAGE) 1000 MG tablet Take 1 tablet (1,000 mg total) by mouth 2 (two) times daily with a meal. 360 tablet 1   . metoprolol tartrate (LOPRESSOR) 100 MG tablet Take 1 tablet (100 mg total) by mouth 2 (two) times daily. 180 tablet 1  . ONETOUCH DELICA LANCETS 14G MISC Use as instructed twice daily, check fasting sugar in morning and check before bedtime. E11.8 100 each 12  . rosuvastatin (CRESTOR) 40 MG tablet Take 1 tablet (40 mg total) by mouth daily. To lower cholesterol 90 tablet 1  . sildenafil (VIAGRA) 50 MG tablet TAKE 2 TABLETS BY MOUTH DAILY AS NEEDED FOR ERECTILE DYSFUNCTION (Patient taking differently: Take 100 mg by mouth as needed for erectile dysfunction. ) 20 tablet 1  . traMADol (ULTRAM) 50 MG tablet Take 1 tablet (50 mg total) by mouth every 6 (six) hours as needed for moderate pain. 12 tablet 0  . insulin NPH-regular Human (NOVOLIN 70/30) (70-30) 100 UNIT/ML injection Inject 10 Units into the skin 2 (two) times daily with a meal. Titrate by 2 units twice daily every fourth day until blood sugars are at goal.  Maximum of 20 units twice daily 20 mL 26  . lisinopril-hydrochlorothiazide (ZESTORETIC) 20-12.5 MG tablet Take 2 tablets by mouth daily. 180 tablet 0  . collagenase (SANTYL) ointment Apply 1 application topically daily. Apply Santyl Ointment to scab on left 3rd and 4th digits. Measurements: Left 3rd toe 1.0 x 0.6 cm Left 4th toe: 2.0 x 1.0 cm (Patient not taking: Reported on 01/22/2020) 30 g 0  . ondansetron (ZOFRAN) 4 MG tablet Take 1 tablet (4 mg total) by mouth every 8 (eight) hours as needed for nausea or vomiting. (Patient not taking: Reported on 01/22/2020) 30 tablet 0  . silver sulfADIAZINE (SILVADENE) 1 % cream Apply to affected toes once daily and cover with dressing. (Patient not taking: Reported on 01/22/2020) 400 g 1   Facility-Administered Medications Prior to Visit  Medication Dose Route Frequency Provider Last Rate Last Admin  . sodium chloride flush (NS) 0.9 % injection 3 mL  3 mL Intravenous Q12H Lorretta Harp, MD      . sodium chloride flush (NS) 0.9 % injection 3 mL  3  mL Intravenous Q12H Lorretta Harp, MD         ROS Review of Systems  Constitutional: Negative for activity change and appetite change.  HENT: Negative for sinus pressure and sore throat.   Eyes: Negative for visual disturbance.  Respiratory: Negative for cough, chest tightness and shortness of breath.   Cardiovascular: Negative for chest pain and leg swelling.  Gastrointestinal: Negative for abdominal distention, abdominal pain, constipation and diarrhea.  Endocrine: Negative.   Genitourinary: Negative for dysuria.  Musculoskeletal: Negative for joint swelling and myalgias.  Skin: Positive for wound. Negative for rash.  Allergic/Immunologic: Negative.   Neurological: Negative for weakness, light-headedness and numbness.  Psychiatric/Behavioral: Negative for dysphoric mood and suicidal ideas.    Objective:  BP 120/72   Pulse 84   Ht _0  (1.727 m)   Wt 183 lb 3.2 oz (83.1 kg)   SpO2 100%   BMI 27.86 kg/m   BP/Weight 02/26/2020 02/04/2020 78/29/5621  Systolic BP 308 657 -  Diastolic BP 72 68 -  Wt. (Lbs) 183.2 - 187.83  BMI 27.86 - 28.56      Physical Exam Constitutional:      Appearance: He is well-developed.  Neck:     Vascular: No JVD.  Cardiovascular:     Rate and Rhythm: Normal rate.     Heart sounds: Murmur heard.   Pulmonary:     Effort: Pulmonary effort is normal.     Breath sounds: No rales.  Chest:     Chest wall: No tenderness.  Abdominal:     General: Bowel sounds are normal. There is no distension.     Palpations: Abdomen is soft. There is no mass.     Tenderness: There is no abdominal tenderness.  Musculoskeletal:        General: Normal range of motion.     Right lower leg: No edema.     Left lower leg: No edema.  Neurological:     Mental Status: He is alert and oriented to person, place, and time.  Psychiatric:        Mood and Affect: Mood normal.     CMP Latest Ref Rng & Units 02/04/2020 02/03/2020 01/30/2020  Glucose 70 - 99  mg/dL 179(H) - 109(H)  BUN 8 - 23 mg/dL 32(H) - 27(H)  Creatinine 0.61 - 1.24 mg/dL 1.43(H) 1.42(H) 1.45(H)  Sodium 135 - 145 mmol/L 135 - 135  Potassium 3.5 - 5.1 mmol/L 4.4 - 4.2  Chloride 98 - 111 mmol/L 102 - 101  CO2 22 - 32 mmol/L 23 - 26  Calcium 8.9 - 10.3 mg/dL 8.3(L) - 9.5  Total Protein 6.5 - 8.1 g/dL - - 7.5  Total Bilirubin 0.3 - 1.2 mg/dL - - 0.6  Alkaline Phos 38 - 126 U/L - - 84  AST 15 - 41 U/L - - 24  ALT 0 - 44 U/L - - 25    Lipid Panel     Component Value Date/Time   CHOL 129 01/15/2019 0945   TRIG 115 01/15/2019 0945   HDL 47 01/15/2019 0945   CHOLHDL 2.7 01/15/2019 0945   CHOLHDL 3.9 04/02/2013 1632   VLDL 66 (H) 04/02/2013 1632   LDLCALC 61 01/15/2019 0945    CBC    Component Value Date/Time   WBC 6.9 02/04/2020 0500   RBC 3.98 (L) 02/04/2020 0500   HGB 11.0 (L) 02/04/2020 0500   HGB 13.3 11/15/2019 1139   HCT 33.7 (L) 02/04/2020 0500   HCT 40.2 11/15/2019 1139   PLT 237 02/04/2020 0500   PLT 300 11/15/2019 1139   MCV 84.7 02/04/2020 0500   MCV 86 11/15/2019 1139   MCH 27.6 02/04/2020 0500   MCHC 32.6 02/04/2020 0500   RDW 14.6 02/04/2020 0500   RDW 14.5 11/15/2019 1139   LYMPHSABS 3.3 (H) 09/28/2017 1113   MONOABS 0.5 06/09/2015 1705   EOSABS 0.3 09/28/2017 1113   BASOSABS 0.0 09/28/2017 1113    Lab Results  Component Value Date   HGBA1C 7.7 (H) 02/03/2020    Assessment & Plan:  1. Type 2 diabetes mellitus with other specified complication, with long-term current use of insulin (HCC) Suboptimally controlled with A1c of 7.7; goal is less than 7.0 Increased Novolin 70/30 from 10 units twice daily to 12 units twice daily Continue Farxiga Counseled on Diabetic diet, my plate method, 631 minutes of moderate intensity exercise/week Blood sugar logs with fasting goals of 80-120 mg/dl, random of less than 180 and in the event of sugars less than 60 mg/dl or greater than 400 mg/dl encouraged to notify the clinic. Advised on the need for  annual eye exams, annual foot exams, Pneumonia vaccine. - insulin NPH-regular Human (NOVOLIN 70/30) (70-30) 100 UNIT/ML injection; Inject 12 Units into the skin 2 (two) times daily with a meal.  Dispense: 20 mL; Refill: 6  2. Contusion of face, initial encounter No evidence of infection Advised to keep area clean  3. Hypertension complicating diabetes ([place]) Controlled Counseled on blood pressure goal of less than 130/80, low-sodium, DASH diet, medication compliance, 150 minutes of moderate intensity exercise per week. Discussed medication compliance, adverse effects. - lisinopril-hydrochlorothiazide (ZESTORETIC) 20-12.5 MG tablet; Take 2 tablets by mouth daily.  Dispense: 180 tablet; Refill: 0  4. Bilateral carotid artery disease, unspecified type ([place]) Status post bilateral transcarotid artery revascularization Continue Plavix Risk factor modification Follow-up with vascular  5.  Hypertensive heart disease EF of 40 to 45% Euvolemic Continue ACE inhibitor, beta-blocker Currently on Farxiga Followed by cardiology  6. Need for immunization against influenza - Flu Vaccine QUAD 36+ mos IM    Meds ordered this encounter  Medications  . insulin NPH-regular Human (NOVOLIN 70/30) (70-30) 100 UNIT/ML injection    Sig: Inject 12 Units into the skin 2 (two) times daily with a meal.    Dispense:  20 mL    Refill:  6  . lisinopril-hydrochlorothiazide (ZESTORETIC) 20-12.5 MG tablet  Sig: Take 2 tablets by mouth daily.    Dispense:  180 tablet    Refill:  0    Follow-up: Return in about 3 months (around 05/26/2020) for Diabetes.       [name], MD, FAAFP. [facility], [place]   02/26/2020, 1:06 PM

## 2020-02-26 NOTE — Progress Notes (Signed)
Had a fall yesterday while working out, states that he got dehydrated.

## 2020-02-27 ENCOUNTER — Ambulatory Visit (HOSPITAL_COMMUNITY)
Admission: RE | Admit: 2020-02-27 | Discharge: 2020-02-27 | Disposition: A | Discharge status: Home / Self Care | Payer: Medicare HMO | Source: Ambulatory Visit | Attending: Vascular Surgery | Admitting: Vascular Surgery

## 2020-02-27 ENCOUNTER — Encounter: Payer: Self-pay | Admitting: Vascular Surgery

## 2020-02-27 ENCOUNTER — Ambulatory Visit (INDEPENDENT_AMBULATORY_CARE_PROVIDER_SITE_OTHER): Payer: Medicare HMO | Admitting: Vascular Surgery

## 2020-02-27 VITALS — BP 130/88 | HR 90 | Temp 97.6°F | Resp 18 | Ht 68.0 in | Wt 176.0 lb

## 2020-02-27 DIAGNOSIS — I739 Peripheral vascular disease, unspecified: Secondary | ICD-10-CM

## 2020-02-27 DIAGNOSIS — I6523 Occlusion and stenosis of bilateral carotid arteries: Secondary | ICD-10-CM | POA: Diagnosis not present

## 2020-02-27 NOTE — Progress Notes (Signed)
Patient is a 69 year old male who returns for follow-up today.  He underwent left transcarotid artery stenting on December 16, 2019 followed by interval right transcarotid stenting February 03, 2020.  These were both done for asymptomatic lesions but done due to high risk from aortic stenosis with decreased ejection fraction.  He is currently on Plavix aspirin and statin.  Of note he also has known peripheral arterial disease which is followed by Dr. Gwenlyn Found.  He reports no difficulty swallowing.  He has had no incisional drainage.  He did have a presyncopal event recently but thinks this was due to dehydration.  He does not recall his blood pressure being low or any chest pain.  He currently has no upper extremity lower extremity motor weakness and no other neurologic symptoms.  Physical exam:  Vitals:   02/27/20 0845 02/27/20 0850  BP: (!) 153/94 130/88  Pulse: 90 90  Resp: 18   Temp: 97.6 F (36.4 C)   TempSrc: Temporal   SpO2: 98%   Weight: 176 lb (79.8 kg)   Height: 5\' 8"  (1.727 m)     Well-healed neck incision bilaterally Neuro: Symmetric upper extremity lower extreme motor strength 5/5 no facial asymmetry   HEENT: Small abrasion bridge of nose  Data: Patient had a duplex ultrasound of his carotid arteries today which showed no significant stenosis.  Stents were widely patent.  Assessment: Doing well status post bilateral transcarotid stenting  Plan: Patient will follow up in 8 months in our APP clinic with bilateral carotid duplex exam.  He will follow-up sooner if he develops any neurologic symptoms.  He will continue follow-up for his peripheral arterial disease with Dr. Gwenlyn Found.  Ruta Hinds, MD [facility] Office: [phone]

## 2020-02-28 ENCOUNTER — Other Ambulatory Visit: Payer: Self-pay

## 2020-02-28 DIAGNOSIS — I6523 Occlusion and stenosis of bilateral carotid arteries: Secondary | ICD-10-CM

## 2020-03-03 ENCOUNTER — Ambulatory Visit: Payer: Medicare HMO | Admitting: Cardiovascular Disease

## 2020-03-06 ENCOUNTER — Other Ambulatory Visit: Payer: Self-pay | Admitting: Family Medicine

## 2020-03-06 DIAGNOSIS — E7800 Pure hypercholesterolemia, unspecified: Secondary | ICD-10-CM

## 2020-03-06 MED ORDER — ROSUVASTATIN CALCIUM 40 MG PO TABS: 40.0000 mg | ORAL_TABLET | Freq: Every day | ORAL | 0 refills | Status: DC

## 2020-03-06 NOTE — Telephone Encounter (Signed)
Medication: rosuvastatin (CRESTOR) 40 MG tablet [[id number]]   Has the patient contacted their pharmacy? YES  (Agent: If no, request that the patient contact the pharmacy for the refill.) (Agent: If yes, when and what did the pharmacy advise?)  Preferred Pharmacy (with phone number or street name): [facility], [place] [address] Phone: [phone] Fax: [phone] Hours: Not open 24 hours    Agent: Please be advised that RX refills may take up to 3 business days. We ask that you follow-up with your pharmacy.

## 2020-03-10 ENCOUNTER — Ambulatory Visit (INDEPENDENT_AMBULATORY_CARE_PROVIDER_SITE_OTHER): Payer: Medicare HMO | Admitting: Orthotics

## 2020-03-10 ENCOUNTER — Other Ambulatory Visit: Payer: Self-pay

## 2020-03-10 ENCOUNTER — Ambulatory Visit (INDEPENDENT_AMBULATORY_CARE_PROVIDER_SITE_OTHER): Payer: Medicare HMO | Admitting: Podiatry

## 2020-03-10 DIAGNOSIS — L97521 Non-pressure chronic ulcer of other part of left foot limited to breakdown of skin: Secondary | ICD-10-CM

## 2020-03-10 DIAGNOSIS — L97502 Non-pressure chronic ulcer of other part of unspecified foot with fat layer exposed: Secondary | ICD-10-CM | POA: Diagnosis not present

## 2020-03-10 DIAGNOSIS — B351 Tinea unguium: Secondary | ICD-10-CM | POA: Diagnosis not present

## 2020-03-10 DIAGNOSIS — E1142 Type 2 diabetes mellitus with diabetic polyneuropathy: Secondary | ICD-10-CM

## 2020-03-10 DIAGNOSIS — M869 Osteomyelitis, unspecified: Secondary | ICD-10-CM

## 2020-03-10 DIAGNOSIS — Z89422 Acquired absence of other left toe(s): Secondary | ICD-10-CM | POA: Diagnosis not present

## 2020-03-10 DIAGNOSIS — E1151 Type 2 diabetes mellitus with diabetic peripheral angiopathy without gangrene: Secondary | ICD-10-CM | POA: Diagnosis not present

## 2020-03-10 DIAGNOSIS — L97512 Non-pressure chronic ulcer of other part of right foot with fat layer exposed: Secondary | ICD-10-CM

## 2020-03-10 DIAGNOSIS — I739 Peripheral vascular disease, unspecified: Secondary | ICD-10-CM

## 2020-03-10 DIAGNOSIS — E11621 Type 2 diabetes mellitus with foot ulcer: Secondary | ICD-10-CM | POA: Diagnosis not present

## 2020-03-16 ENCOUNTER — Encounter: Payer: Self-pay | Admitting: Podiatry

## 2020-03-16 NOTE — Progress Notes (Signed)
Subjective:  Patient ID: Caleb [name], male    DOB: April 26, 1950,  MRN: [id number]  69 y.o. male presents with at risk foot care. Patient has h/o amputation of digital amputation L 4th toe and L 5th toes, performed in September, 2021, by Dr. Boneta Lucks. He is seen for mycotic  toenails that are difficult to trim. Pain interferes with ambulation. Aggravating factors include wearing enclosed shoe gear. Pain is relieved with periodic professional debridement.Marland Kitchen    He voices no new pedal concerns on today's visit. He states he will be going to [employer] early tomorrow morning.  PCP: [name], MD and last visit was: 02/26/2020.  Review of Systems: Negative except as noted in the HPI.  Past Medical History:  Diagnosis Date  . Aortic stenosis   . Carotid artery occlusion   . Diabetes mellitus without complication ([place])   . Dyslipidemia 09/27/2012  . Erectile dysfunction 09/27/2012  . Heart murmur   . Hypercholesteremia   . Hyperlipidemia 08/12/2012  . Hypertension   . Hyponatremia 08/14/2012  . Neuropathy   . Pancreatitis   . Pancreatitis, acute 09/27/2012  . Peripheral vascular disease ([place])   . Vitamin D deficiency 06/23/2015   Past Surgical History:  Procedure Laterality Date  . ABDOMINAL AORTOGRAM W/LOWER EXTREMITY N/A 08/08/2019   Procedure: ABDOMINAL AORTOGRAM W/LOWER EXTREMITY;  Surgeon: Lorretta Harp, MD;  Location: [facility];  Service: Cardiovascular;  Laterality: N/A;  . ABDOMINAL AORTOGRAM W/LOWER EXTREMITY N/A 11/18/2019   Procedure: ABDOMINAL AORTOGRAM W/LOWER EXTREMITY;  Surgeon: Lorretta Harp, MD;  Location: [facility];  Service: Cardiovascular;  Laterality: N/A;  . COLONOSCOPY  12 years ago   in [place] clinic= normal exam per pt  . KNEE SURGERY    . PERIPHERAL VASCULAR INTERVENTION Right 08/08/2019   Procedure: PERIPHERAL VASCULAR INTERVENTION;  Surgeon: Lorretta Harp, MD;  Location: [facility] [facility];  Service: Cardiovascular;  Laterality:  Right;  . PERIPHERAL VASCULAR INTERVENTION Left 11/18/2019   Procedure: PERIPHERAL VASCULAR INTERVENTION;  Surgeon: Lorretta Harp, MD;  Location: [facility];  Service: Cardiovascular;  Laterality: Left;  left popliteal artery  . TRANSCAROTID ARTERY REVASCULARIZATION Left 12/16/2019   Procedure: TRANSCAROTID ARTERY REVASCULARIZATION;  Surgeon: Elam Dutch, MD;  Location: [facility];  Service: Vascular;  Laterality: Left;  . TRANSCAROTID ARTERY REVASCULARIZATION Right 02/03/2020   Procedure: RIGHT TRANSCAROTID ARTERY REVASCULARIZATION;  Surgeon: Elam Dutch, MD;  Location: [facility];  Service: Vascular;  Laterality: Right;  . ULTRASOUND GUIDANCE FOR VASCULAR ACCESS Right 12/16/2019   Procedure: ULTRASOUND GUIDANCE FOR VASCULAR ACCESS;  Surgeon: Elam Dutch, MD;  Location: [facility];  Service: Vascular;  Laterality: Right;  . ULTRASOUND GUIDANCE FOR VASCULAR ACCESS Right 02/03/2020   Procedure: ULTRASOUND GUIDANCE FOR VASCULAR ACCESS;  Surgeon: Elam Dutch, MD;  Location: [facility];  Service: Vascular;  Laterality: Right;   Patient Active Problem List   Diagnosis Date Noted  . Carotid stenosis, asymptomatic 02/03/2020  . Peripheral vascular disease ([place])   . Carotid artery stenosis 12/16/2019  . Moderate aortic stenosis 12/02/2019  . Critical ischemia of foot ([place]) 11/18/2019  . Carotid artery disease ([place]) 08/23/2019  . Critical limb ischemia with history of revascularization of same extremity ([place]) 08/02/2019  . Diabetes mellitus without complication ([place])   . Cardiac murmur 02/22/2018  . Vitamin D deficiency 06/23/2015  . Pancreatitis, acute 09/27/2012  . Dyslipidemia 09/27/2012  . Erectile dysfunction 09/27/2012  . Unspecified essential hypertension 09/27/2012  .  Obesity, unspecified 08/14/2012  . Hyponatremia 08/14/2012  . Acute pancreatitis 08/12/2012  . Diabetes mellitus ([place]) 08/12/2012  . Hypertension 08/12/2012  . Hyperlipidemia 08/12/2012  . Metabolic  acidosis 97/35/3299    Current Outpatient Medications:  .  aspirin EC 81 MG tablet, Take 81 mg by mouth daily., Disp: , Rfl:  .  Blood Glucose Monitoring Suppl (ONETOUCH VERIO) w/Device KIT, 1 each by Does not apply route 2 (two) times daily. Use as instructed twice daily, check fasting sugar in morning and check before bedtime. E11.8, Disp: 1 kit, Rfl: 0 .  clopidogrel (PLAVIX) 75 MG tablet, Take 1 tablet (75 mg total) by mouth daily., Disp: 90 tablet, Rfl: 1 .  collagenase (SANTYL) ointment, Apply 1 application topically daily. Apply Santyl Ointment to scab on left 3rd and 4th digits. Measurements: Left 3rd toe 1.0 x 0.6 cm Left 4th toe: 2.0 x 1.0 cm, Disp: 30 g, Rfl: 0 .  Continuous Blood Gluc Receiver (FREESTYLE LIBRE 14 DAY READER) DEVI, 1 each by Does not apply route See admin instructions. Use as directed to check blood glucose, Disp: 1 each, Rfl: 0 .  Continuous Blood Gluc Sensor (FREESTYLE LIBRE 14 DAY SENSOR) MISC, INJECT IN THE SKIN EVERY 14 (FOURTEEN) DAYS. E11.69 Z79.4, Disp: 6 each, Rfl: 3 .  dapagliflozin propanediol (FARXIGA) 5 MG TABS tablet, Take 1 tablet (5 mg total) by mouth daily before breakfast., Disp: 90 tablet, Rfl: 1 .  gabapentin (NEURONTIN) 300 MG capsule, Take 1 capsule (300 mg total) by mouth 2 (two) times daily. (Patient taking differently: Take 300 mg by mouth 2 (two) times daily as needed (pain).), Disp: 180 capsule, Rfl: 1 .  glucose blood test strip, Use as instructed twice daily, check fasting sugar in morning and check before bedtime. E11.8, Disp: 100 each, Rfl: 12 .  insulin NPH-regular Human (NOVOLIN 70/30) (70-30) 100 UNIT/ML injection, Inject 12 Units into the skin 2 (two) times daily with a meal., Disp: 20 mL, Rfl: 6 .  Insulin Syringe-Needle U-100 (TRUEPLUS INSULIN SYRINGE) 31G X 5/16" 0.3 ML MISC, Use as instructed to inject insulin daily., Disp: 100 each, Rfl: 2 .  lisinopril-hydrochlorothiazide (ZESTORETIC) 20-12.5 MG tablet, Take 2 tablets by mouth  daily., Disp: 180 tablet, Rfl: 0 .  metFORMIN (GLUCOPHAGE) 1000 MG tablet, Take 1 tablet (1,000 mg total) by mouth 2 (two) times daily with a meal., Disp: 360 tablet, Rfl: 1 .  metoprolol tartrate (LOPRESSOR) 100 MG tablet, Take 1 tablet (100 mg total) by mouth 2 (two) times daily., Disp: 180 tablet, Rfl: 1 .  ondansetron (ZOFRAN) 4 MG tablet, Take 1 tablet (4 mg total) by mouth every 8 (eight) hours as needed for nausea or vomiting., Disp: 30 tablet, Rfl: 0 .  ONETOUCH DELICA LANCETS 24Q MISC, Use as instructed twice daily, check fasting sugar in morning and check before bedtime. E11.8, Disp: 100 each, Rfl: 12 .  rosuvastatin (CRESTOR) 40 MG tablet, Take 1 tablet (40 mg total) by mouth daily. To lower cholesterol, Disp: 90 tablet, Rfl: 0 .  sildenafil (VIAGRA) 50 MG tablet, TAKE 2 TABLETS BY MOUTH DAILY AS NEEDED FOR ERECTILE DYSFUNCTION (Patient taking differently: Take 100 mg by mouth as needed for erectile dysfunction.), Disp: 20 tablet, Rfl: 1 .  silver sulfADIAZINE (SILVADENE) 1 % cream, Apply to affected toes once daily and cover with dressing., Disp: 400 g, Rfl: 1 .  traMADol (ULTRAM) 50 MG tablet, Take 1 tablet (50 mg total) by mouth every 6 (six) hours as needed for moderate pain.,  Disp: 12 tablet, Rfl: 0  Current Facility-Administered Medications:  .  sodium chloride flush (NS) 0.9 % injection 3 mL, 3 mL, Intravenous, Q12H, Lorretta Harp, MD .  sodium chloride flush (NS) 0.9 % injection 3 mL, 3 mL, Intravenous, Q12H, Lorretta Harp, MD Allergies  Allergen Reactions  . Percocet [Oxycodone-Acetaminophen] Nausea And Vomiting    10/325. Pt has 3 doses of this medication. N/V, temperature 97, no chills and slight sweating. Pt dressing was C/D/I no drainage noted.    Social History   Tobacco Use  Smoking Status Never Smoker  Smokeless Tobacco Never Used    Objective:  There were no vitals filed for this visit. Constitutional Patient is a pleasant 69 y.o. African American male  WD, WN in NAD. AAO x 3.  Vascular Capillary refill time to remaining digits <3 seconds. Nonpalpable DP pulse(s) b/l lower extremities. Nonpalpable PT pulse(s) b/l lower extremities. Pedal hair absent. Lower extremity skin temperature gradient within normal limits. No ischemia or gangrene noted b/l lower extremities. No cyanosis or clubbing noted.  Neurologic Normal speech. Protective sensation intact 5/5 intact bilaterally with 10g monofilament b/l.  Dermatologic Pedal skin with normal turgor, texture and tone bilaterally. No open wounds bilaterally. No interdigital macerations bilaterally. Toenails 1-5 right, R hallux, R 2nd toe and R 3rd toe elongated, discolored, dystrophic, thickened, and crumbly with subungual debris and tenderness to dorsal palpation.  Orthopedic: Normal muscle strength 5/5 to all lower extremity muscle groups bilaterally. Lower extremity amputation(s): digital amputation L 4th toe and L 5th toe. Patient ambulates independent of any assistive aids.   Hemoglobin A1C Latest Ref Rng & Units 02/03/2020 11/18/2019  HGBA1C 4.8 - 5.6 % 7.7(H) 7.6(H)  Some recent data might be hidden       Assessment:   1. Onychomycosis   2. Status post amputation of lesser toe of left foot ([place])   3. Type II diabetes mellitus with peripheral circulatory disorder [facility])    Plan:  Patient was evaluated and treated and all questions answered.  Onychomycosis with pain -Nails palliatively debridement as below. -Educated on self-care  Procedure: Nail Debridement Rationale: Pain Type of Debridement: manual, sharp debridement. Instrumentation: Nail nipper, rotary burr. Number of Nails: 8  -Examined patient. -Continue diabetic foot care principles. -Continue diabetic shoes daily. -Toenails 1-5 right, L hallux, L 2nd toe and L 3rd toe debrided in length and girth without iatrogenic bleeding with sterile nail nipper and dremel.  -Patient to report any pedal injuries to medical professional  immediately. -Patient/POA to call should there be question/concern in the interim.  Return in about 3 months (around 06/08/2020) for diabetic foot care.  Marzetta Board, DPM

## 2020-03-27 DIAGNOSIS — E119 Type 2 diabetes mellitus without complications: Secondary | ICD-10-CM | POA: Diagnosis not present

## 2020-04-28 DIAGNOSIS — E663 Overweight: Secondary | ICD-10-CM | POA: Diagnosis not present

## 2020-04-28 DIAGNOSIS — Z008 Encounter for other general examination: Secondary | ICD-10-CM | POA: Diagnosis not present

## 2020-04-28 DIAGNOSIS — I6529 Occlusion and stenosis of unspecified carotid artery: Secondary | ICD-10-CM | POA: Diagnosis not present

## 2020-04-28 DIAGNOSIS — Z6828 Body mass index (BMI) 28.0-28.9, adult: Secondary | ICD-10-CM | POA: Diagnosis not present

## 2020-04-28 DIAGNOSIS — R011 Cardiac murmur, unspecified: Secondary | ICD-10-CM | POA: Diagnosis not present

## 2020-05-06 ENCOUNTER — Other Ambulatory Visit: Payer: Self-pay | Admitting: Family Medicine

## 2020-05-06 DIAGNOSIS — E7800 Pure hypercholesterolemia, unspecified: Secondary | ICD-10-CM

## 2020-05-12 DIAGNOSIS — S8392XA Sprain of unspecified site of left knee, initial encounter: Secondary | ICD-10-CM | POA: Diagnosis not present

## 2020-05-12 DIAGNOSIS — S8001XA Contusion of right knee, initial encounter: Secondary | ICD-10-CM | POA: Diagnosis not present

## 2020-05-18 ENCOUNTER — Other Ambulatory Visit: Payer: Self-pay | Admitting: Family Medicine

## 2020-05-18 DIAGNOSIS — I152 Hypertension secondary to endocrine disorders: Secondary | ICD-10-CM

## 2020-05-18 DIAGNOSIS — E1159 Type 2 diabetes mellitus with other circulatory complications: Secondary | ICD-10-CM

## 2020-05-20 ENCOUNTER — Other Ambulatory Visit: Payer: Self-pay | Admitting: Family Medicine

## 2020-05-20 DIAGNOSIS — N529 Male erectile dysfunction, unspecified: Secondary | ICD-10-CM

## 2020-05-20 DIAGNOSIS — I10 Essential (primary) hypertension: Secondary | ICD-10-CM

## 2020-05-22 ENCOUNTER — Ambulatory Visit (INDEPENDENT_AMBULATORY_CARE_PROVIDER_SITE_OTHER): Payer: Medicare HMO | Admitting: Podiatry

## 2020-05-22 ENCOUNTER — Other Ambulatory Visit: Payer: Self-pay

## 2020-05-22 ENCOUNTER — Other Ambulatory Visit: Payer: Self-pay | Admitting: Podiatry

## 2020-05-22 DIAGNOSIS — L97422 Non-pressure chronic ulcer of left heel and midfoot with fat layer exposed: Secondary | ICD-10-CM | POA: Diagnosis not present

## 2020-05-22 DIAGNOSIS — R234 Changes in skin texture: Secondary | ICD-10-CM

## 2020-05-22 DIAGNOSIS — E11621 Type 2 diabetes mellitus with foot ulcer: Secondary | ICD-10-CM | POA: Diagnosis not present

## 2020-05-22 DIAGNOSIS — L97522 Non-pressure chronic ulcer of other part of left foot with fat layer exposed: Secondary | ICD-10-CM

## 2020-05-22 DIAGNOSIS — L853 Xerosis cutis: Secondary | ICD-10-CM

## 2020-05-22 MED ORDER — SANTYL 250 UNIT/GM EX OINT: 1.0000 "application " | TOPICAL_OINTMENT | Freq: Every day | CUTANEOUS | 0 refills | Status: DC

## 2020-05-22 MED ORDER — COLLAGENASE 250 UNIT/GM EX OINT: 1.0000 "application " | TOPICAL_OINTMENT | Freq: Every day | CUTANEOUS | 0 refills | Status: DC

## 2020-05-25 ENCOUNTER — Encounter: Payer: Self-pay | Admitting: Podiatry

## 2020-05-26 ENCOUNTER — Encounter: Payer: Self-pay | Admitting: Podiatry

## 2020-05-26 ENCOUNTER — Ambulatory Visit: Payer: Medicare HMO | Admitting: Family Medicine

## 2020-05-26 ENCOUNTER — Other Ambulatory Visit: Payer: Self-pay | Admitting: Family Medicine

## 2020-05-26 ENCOUNTER — Other Ambulatory Visit: Payer: Self-pay

## 2020-05-26 NOTE — Progress Notes (Signed)
Subjective:  Patient ID: Caleb [name], male    DOB: February 15, 1951,  MRN: [id number]  Chief Complaint  Patient presents with  . Foot Pain    PT has a cut to the left heel     70 y.o. male presents with the above complaint.  Patient presents with a new complaint to the left heel severe dry skin with fissuring into an ulceration.  Patient states been developing for quite some time.  Patient has not been moisturizing aggressively to the left heel.  He has not done anything to help with the ulceration.  He has not been doing any dressing changes.  He would like to get it evaluated.  He denies any other acute complaints.   Review of Systems: Negative except as noted in the HPI. Denies N/V/F/Ch.  Past Medical History:  Diagnosis Date  . Aortic stenosis   . Carotid artery occlusion   . Diabetes mellitus without complication ([place])   . Dyslipidemia 09/27/2012  . Erectile dysfunction 09/27/2012  . Heart murmur   . Hypercholesteremia   . Hyperlipidemia 08/12/2012  . Hypertension   . Hyponatremia 08/14/2012  . Neuropathy   . Pancreatitis   . Pancreatitis, acute 09/27/2012  . Peripheral vascular disease ([place])   . Vitamin D deficiency 06/23/2015    Current Outpatient Medications:  .  collagenase (SANTYL) ointment, Apply 1 application topically daily., Disp: 15 g, Rfl: 0 .  aspirin EC 81 MG tablet, Take 81 mg by mouth daily., Disp: , Rfl:  .  Blood Glucose Monitoring Suppl (ONETOUCH VERIO) w/Device KIT, 1 each by Does not apply route 2 (two) times daily. Use as instructed twice daily, check fasting sugar in morning and check before bedtime. E11.8, Disp: 1 kit, Rfl: 0 .  clopidogrel (PLAVIX) 75 MG tablet, Take 1 tablet (75 mg total) by mouth daily., Disp: 90 tablet, Rfl: 1 .  collagenase (SANTYL) ointment, Apply 1 application topically daily. Apply Santyl Ointment to scab on left 3rd and 4th digits. Measurements: Left 3rd toe 1.0 x 0.6 cm Left 4th toe: 2.0 x 1.0 cm, Disp: 30 g, Rfl: 0 .  collagenase  (SANTYL) ointment, Apply 1 application topically daily., Disp: 90 each, Rfl: 0 .  Continuous Blood Gluc Receiver (FREESTYLE LIBRE 14 DAY READER) DEVI, 1 each by Does not apply route See admin instructions. Use as directed to check blood glucose, Disp: 1 each, Rfl: 0 .  Continuous Blood Gluc Sensor (FREESTYLE LIBRE 14 DAY SENSOR) MISC, INJECT IN THE SKIN EVERY 14 (FOURTEEN) DAYS. E11.69 Z79.4, Disp: 6 each, Rfl: 3 .  dapagliflozin propanediol (FARXIGA) 5 MG TABS tablet, Take 1 tablet (5 mg total) by mouth daily before breakfast., Disp: 90 tablet, Rfl: 1 .  gabapentin (NEURONTIN) 300 MG capsule, Take 1 capsule (300 mg total) by mouth 2 (two) times daily. (Patient taking differently: Take 300 mg by mouth 2 (two) times daily as needed (pain).), Disp: 180 capsule, Rfl: 1 .  glucose blood test strip, Use as instructed twice daily, check fasting sugar in morning and check before bedtime. E11.8, Disp: 100 each, Rfl: 12 .  insulin NPH-regular Human (NOVOLIN 70/30) (70-30) 100 UNIT/ML injection, Inject 12 Units into the skin 2 (two) times daily with a meal., Disp: 20 mL, Rfl: 6 .  Insulin Syringe-Needle U-100 (TRUEPLUS INSULIN SYRINGE) 31G X 5/16" 0.3 ML MISC, Use as instructed to inject insulin daily., Disp: 100 each, Rfl: 2 .  lisinopril-hydrochlorothiazide (ZESTORETIC) 20-12.5 MG tablet, Take 2 tablets by mouth daily., Disp: 180 tablet,  Rfl: 0 .  metFORMIN (GLUCOPHAGE) 1000 MG tablet, Take 1 tablet (1,000 mg total) by mouth 2 (two) times daily with a meal., Disp: 360 tablet, Rfl: 1 .  metoprolol tartrate (LOPRESSOR) 100 MG tablet, TAKE 1 TABLET TWICE DAILY, Disp: 180 tablet, Rfl: 0 .  ondansetron (ZOFRAN) 4 MG tablet, Take 1 tablet (4 mg total) by mouth every 8 (eight) hours as needed for nausea or vomiting., Disp: 30 tablet, Rfl: 0 .  ONETOUCH DELICA LANCETS 60Q MISC, Use as instructed twice daily, check fasting sugar in morning and check before bedtime. E11.8, Disp: 100 each, Rfl: 12 .  rosuvastatin  (CRESTOR) 40 MG tablet, Take 1 tablet (40 mg total) by mouth daily. To lower cholesterol, Disp: 90 tablet, Rfl: 0 .  sildenafil (VIAGRA) 50 MG tablet, TAKE 2 TABLETS DAILY AS NEEDED FOR ERECTILE DYSFUNCTION, Disp: 18 tablet, Rfl: 0 .  silver sulfADIAZINE (SILVADENE) 1 % cream, Apply to affected toes once daily and cover with dressing., Disp: 400 g, Rfl: 1 .  traMADol (ULTRAM) 50 MG tablet, Take 1 tablet (50 mg total) by mouth every 6 (six) hours as needed for moderate pain., Disp: 12 tablet, Rfl: 0  Current Facility-Administered Medications:  .  sodium chloride flush (NS) 0.9 % injection 3 mL, 3 mL, Intravenous, Q12H, Lorretta Harp, MD .  sodium chloride flush (NS) 0.9 % injection 3 mL, 3 mL, Intravenous, Q12H, Lorretta Harp, MD  Social History   Tobacco Use  Smoking Status Never Smoker  Smokeless Tobacco Never Used    Allergies  Allergen Reactions  . Percocet [Oxycodone-Acetaminophen] Nausea And Vomiting    10/325. Pt has 3 doses of this medication. N/V, temperature 97, no chills and slight sweating. Pt dressing was C/D/I no drainage noted.    Objective:  There were no vitals filed for this visit. There is no height or weight on file to calculate BMI. Constitutional Well developed. Well nourished.  Vascular Dorsalis pedis pulses palpable bilaterally. Posterior tibial pulses palpable bilaterally. Capillary refill normal to all digits.  No cyanosis or clubbing noted. Pedal hair growth normal.  Neurologic Normal speech. Oriented to person, place, and time. Epicritic sensation to light touch grossly present bilaterally.  Dermatologic  severe dryness noted to the left heel with underlying skin fissure leading to exposure of the underlying fat layer of the skin.  No purulent drainage noted.  Does not probe down to deep tissue.  No malodor present.  No cellulitis present  Orthopedic: Normal joint ROM without pain or crepitus bilaterally. No visible deformities. No bony  tenderness.   Radiographs: None Assessment:   1. Skin fissure   2. Diabetic ulcer of left heel associated with type 2 diabetes mellitus, with fat layer exposed ([place])   3. Xerosis cutis    Plan:  Patient was evaluated and treated and all questions answered.  Left heel severe dry skin with underlying fissure -I explained the patient the etiology of fissure formation and his relationship with cirrhosis.  I explained to the patient the importance of moisturization to prevent severe dryness especially during wintertime that could prevent ulceration.  Given that is diabetic and high risk of losing his leg given the location of it I encouraged aggressive dressing changes.  Ulcer left heel with fat layer exposed -Debridement as below. -Dressed with Santyl wet-to-dry, DSD.  Santyl was sent to the pharmacy -Continue off-loading with cam boot -Cam boot was dispensed for offloading  Procedure: Excisional Debridement of Wound Tool: Sharp chisel blade/tissue nipper Rationale:  Removal of non-viable soft tissue from the wound to promote healing.  Anesthesia: none Pre-Debridement Wound Measurements: 1.3 cm x 0.7 cm x 0.3 cm  Post-Debridement Wound Measurements: 1.5 cm x 0.6 cm x 0.3 cm  Type of Debridement: Sharp Excisional Tissue Removed: Non-viable soft tissue Blood loss: Minimal (<50cc) Depth of Debridement: subcutaneous tissue. Technique: Sharp excisional debridement to bleeding, viable wound base.  Wound Progress: This may initially encounter we will continue monitor the progression of the wound Site healing conversation 7 Dressing: Dry, sterile, compression dressing. Disposition: Patient tolerated procedure well. Patient to return in 1 week for follow-up.  No follow-ups on file.     No follow-ups on file.

## 2020-05-27 ENCOUNTER — Emergency Department (HOSPITAL_COMMUNITY)
Admission: EM | Admit: 2020-05-27 | Discharge: 2020-05-27 | Disposition: A | Discharge status: Home / Self Care | Payer: Medicare HMO | Attending: Emergency Medicine | Admitting: Emergency Medicine

## 2020-05-27 ENCOUNTER — Encounter: Payer: Self-pay | Admitting: Orthopedic Surgery

## 2020-05-27 ENCOUNTER — Ambulatory Visit (INDEPENDENT_AMBULATORY_CARE_PROVIDER_SITE_OTHER): Payer: Medicare HMO | Admitting: Orthopedic Surgery

## 2020-05-27 ENCOUNTER — Emergency Department (HOSPITAL_COMMUNITY): Payer: Medicare HMO

## 2020-05-27 ENCOUNTER — Other Ambulatory Visit: Payer: Self-pay

## 2020-05-27 VITALS — BP 207/114 | HR 95 | Temp 97.7°F

## 2020-05-27 DIAGNOSIS — Z7902 Long term (current) use of antithrombotics/antiplatelets: Secondary | ICD-10-CM | POA: Insufficient documentation

## 2020-05-27 DIAGNOSIS — R011 Cardiac murmur, unspecified: Secondary | ICD-10-CM | POA: Diagnosis not present

## 2020-05-27 DIAGNOSIS — I10 Essential (primary) hypertension: Secondary | ICD-10-CM

## 2020-05-27 DIAGNOSIS — Z7982 Long term (current) use of aspirin: Secondary | ICD-10-CM | POA: Diagnosis not present

## 2020-05-27 DIAGNOSIS — R61 Generalized hyperhidrosis: Secondary | ICD-10-CM

## 2020-05-27 DIAGNOSIS — E1140 Type 2 diabetes mellitus with diabetic neuropathy, unspecified: Secondary | ICD-10-CM | POA: Diagnosis not present

## 2020-05-27 DIAGNOSIS — Z794 Long term (current) use of insulin: Secondary | ICD-10-CM | POA: Insufficient documentation

## 2020-05-27 DIAGNOSIS — Z79899 Other long term (current) drug therapy: Secondary | ICD-10-CM | POA: Insufficient documentation

## 2020-05-27 DIAGNOSIS — M25462 Effusion, left knee: Secondary | ICD-10-CM

## 2020-05-27 DIAGNOSIS — R110 Nausea: Secondary | ICD-10-CM | POA: Diagnosis not present

## 2020-05-27 LAB — CBC
HCT: 48.2 % (ref 39.0–52.0)
Hemoglobin: 15.3 g/dL (ref 13.0–17.0)
MCH: 27.8 pg (ref 26.0–34.0)
MCHC: 31.7 g/dL (ref 30.0–36.0)
MCV: 87.5 fL (ref 80.0–100.0)
Platelets: 298 10*3/uL (ref 150–400)
RBC: 5.51 MIL/uL (ref 4.22–5.81)
RDW: 13.6 % (ref 11.5–15.5)
WBC: 6.7 10*3/uL (ref 4.0–10.5)
nRBC: 0 % (ref 0.0–0.2)

## 2020-05-27 LAB — BASIC METABOLIC PANEL
Anion gap: 14 (ref 5–15)
BUN: 31 mg/dL — ABNORMAL HIGH (ref 8–23)
CO2: 23 mmol/L (ref 22–32)
Calcium: 10 mg/dL (ref 8.9–10.3)
Chloride: 102 mmol/L (ref 98–111)
Creatinine, Ser: 1.47 mg/dL — ABNORMAL HIGH (ref 0.61–1.24)
GFR, Estimated: 51 mL/min — ABNORMAL LOW (ref >60)
Glucose, Bld: 173 mg/dL — ABNORMAL HIGH (ref 70–99)
Potassium: 4.3 mmol/L (ref 3.5–5.1)
Sodium: 139 mmol/L (ref 135–145)

## 2020-05-27 LAB — TROPONIN I (HIGH SENSITIVITY)
Troponin I (High Sensitivity): 33 ng/L — ABNORMAL HIGH (ref <18)
Troponin I (High Sensitivity): 35 ng/L — ABNORMAL HIGH (ref <18)

## 2020-05-27 MED ORDER — IOHEXOL 350 MG/ML SOLN
100.0000 mL | Freq: Once | INTRAVENOUS | Status: AC | PRN
  Administered 2020-05-27: 100 mL via INTRAVENOUS

## 2020-05-27 NOTE — ED Triage Notes (Addendum)
Arrived via EMS from scheduled appointment at Ortho doctor. While at doctor patient blood pressure was elevated 208/120 had nausea, pale, and diaphoretic. En route patient states feels fine. Alert answering and following commands appropriate. CBG 157. Patient seen for pain left knee and has a sores on his left foot. Doctor note with patient also states unsure if this was some type of allergic reaction to Ultram.

## 2020-05-27 NOTE — Progress Notes (Signed)
Caleb [name] is a 70 year old patient who comes in today for evaluation of left knee pain.  He does have complicated past medical history with history of diabetes hypertension and peripheral artery disease.  Although he was coming in to have his knee evaluated he was noted to be profusely sweating.  Took Ultram last night and had emesis x4 this morning but none since.  On exam his blood pressure is 207/114.  No focal motor or sensory defects.  He is sweating profusely from his head.  He is alert and oriented x3.  His heart rate is regular.  He does have a left knee effusion but it is not warm and he has excellent range of motion.  Plan at this time is transport to the emergency department for treatment of potential early hypertensive crisis.  Not exactly sure if this is some type of allergic reaction to Ultram or some other type of medical problem.  He does have a sore on his left foot around the heel and a sore on his anterior right ankle but none of those are fluctuant or infected appearing.  We plan to see him back for clinical recheck on his knee after these medical issues are resolved.Marland Kitchen No charge visit today

## 2020-05-27 NOTE — ED Notes (Signed)
RN notified about vitals 

## 2020-05-27 NOTE — ED Provider Notes (Signed)
[facility] EMERGENCY DEPARTMENT Provider Note   CSN: [id number] Arrival date & time: 05/27/20  1530     History Chief Complaint  Patient presents with  . Hypertension    Caleb [name] is a 70 y.o. male.  70 yo M with a chief complaints of diaphoresis.  This occurred while the patient was walking to his orthopedic office.  He denied any chest pain or trouble breathing denied headache or neck pain.  He had not really had anything to eat since breakfast and so he thought he might be hungry.  Reportedly his blood sugar there was normal.  He was then sent to the emergency department after a orthopedic evaluation.  Was noted to be hypertensive.  Patient continues to be asymptomatic.  His diaphoresis has resolved.  He had a few episodes of vomiting this morning that he thinks might be related to tramadol that he took last night.  He denies any abdominal pain.  Denies diarrhea.  Not nauseated currently and would like to eat and drink something.  The history is provided by the patient.  Hypertension Pertinent negatives include no chest pain, no abdominal pain, no headaches and no shortness of breath.  Illness Severity:  Moderate Onset quality:  Gradual Duration:  2 minutes Timing:  Rare Progression:  Resolved Chronicity:  New Associated symptoms: no abdominal pain, no chest pain, no congestion, no diarrhea, no fever, no headaches, no myalgias, no rash, no shortness of breath and no vomiting        Past Medical History:  Diagnosis Date  . Aortic stenosis   . Carotid artery occlusion   . Diabetes mellitus without complication ([place])   . Dyslipidemia 09/27/2012  . Erectile dysfunction 09/27/2012  . Heart murmur   . Hypercholesteremia   . Hyperlipidemia 08/12/2012  . Hypertension   . Hyponatremia 08/14/2012  . Neuropathy   . Pancreatitis   . Pancreatitis, acute 09/27/2012  . Peripheral vascular disease ([place])   . Vitamin D deficiency 06/23/2015    Patient Active  Problem List   Diagnosis Date Noted  . Carotid stenosis, asymptomatic 02/03/2020  . Peripheral vascular disease ([place])   . Carotid artery stenosis 12/16/2019  . Moderate aortic stenosis 12/02/2019  . Critical ischemia of foot ([place]) 11/18/2019  . Carotid artery disease ([place]) 08/23/2019  . Critical limb ischemia with history of revascularization of same extremity ([place]) 08/02/2019  . Diabetes mellitus without complication ([place])   . Cardiac murmur 02/22/2018  . Vitamin D deficiency 06/23/2015  . Pancreatitis, acute 09/27/2012  . Dyslipidemia 09/27/2012  . Erectile dysfunction 09/27/2012  . Unspecified essential hypertension 09/27/2012  . Obesity, unspecified 08/14/2012  . Hyponatremia 08/14/2012  . Acute pancreatitis 08/12/2012  . Diabetes mellitus ([place]) 08/12/2012  . Hypertension 08/12/2012  . Hyperlipidemia 08/12/2012  . Metabolic acidosis 83/38/2505    Past Surgical History:  Procedure Laterality Date  . ABDOMINAL AORTOGRAM W/LOWER EXTREMITY N/A 08/08/2019   Procedure: ABDOMINAL AORTOGRAM W/LOWER EXTREMITY;  Surgeon: Lorretta Harp, MD;  Location: [facility];  Service: Cardiovascular;  Laterality: N/A;  . ABDOMINAL AORTOGRAM W/LOWER EXTREMITY N/A 11/18/2019   Procedure: ABDOMINAL AORTOGRAM W/LOWER EXTREMITY;  Surgeon: Lorretta Harp, MD;  Location: [facility];  Service: Cardiovascular;  Laterality: N/A;  . COLONOSCOPY  12 years ago   in [place] clinic= normal exam per pt  . KNEE SURGERY    . PERIPHERAL VASCULAR INTERVENTION Right 08/08/2019   Procedure: PERIPHERAL VASCULAR INTERVENTION;  Surgeon: Lorretta Harp, MD;  Location: [facility];  Service: Cardiovascular;  Laterality: Right;  . PERIPHERAL VASCULAR INTERVENTION Left 11/18/2019   Procedure: PERIPHERAL VASCULAR INTERVENTION;  Surgeon: Lorretta Harp, MD;  Location: [facility];  Service: Cardiovascular;  Laterality: Left;  left popliteal artery  . TRANSCAROTID ARTERY REVASCULARIZATION Left  12/16/2019   Procedure: TRANSCAROTID ARTERY REVASCULARIZATION;  Surgeon: Elam Dutch, MD;  Location: [facility];  Service: Vascular;  Laterality: Left;  . TRANSCAROTID ARTERY REVASCULARIZATION Right 02/03/2020   Procedure: RIGHT TRANSCAROTID ARTERY REVASCULARIZATION;  Surgeon: Elam Dutch, MD;  Location: [facility];  Service: Vascular;  Laterality: Right;  . ULTRASOUND GUIDANCE FOR VASCULAR ACCESS Right 12/16/2019   Procedure: ULTRASOUND GUIDANCE FOR VASCULAR ACCESS;  Surgeon: Elam Dutch, MD;  Location: [facility];  Service: Vascular;  Laterality: Right;  . ULTRASOUND GUIDANCE FOR VASCULAR ACCESS Right 02/03/2020   Procedure: ULTRASOUND GUIDANCE FOR VASCULAR ACCESS;  Surgeon: Elam Dutch, MD;  Location: [facility] OR;  Service: Vascular;  Laterality: Right;       Family History  Problem Relation Age of Onset  . CAD Father   . Hypertension Father   . Alcohol abuse Father        Cause of death  . Diabetes Mother   . Colon polyps Mother   . CAD Brother 55       CABG  . Colon cancer Neg Hx   . Esophageal cancer Neg Hx   . Rectal cancer Neg Hx   . Stomach cancer Neg Hx     Social History   Tobacco Use  . Smoking status: Never Smoker  . Smokeless tobacco: Never Used  Vaping Use  . Vaping Use: Never used  Substance Use Topics  . Alcohol use: Not Currently    Comment: rare  . Drug use: No    Home Medications Prior to Admission medications   Medication Sig Start Date End Date Taking? Authorizing Provider  aspirin EC 81 MG tablet Take 81 mg by mouth daily.    [provider]  Blood Glucose Monitoring Suppl (ONETOUCH VERIO) w/Device KIT 1 each by Does not apply route 2 (two) times daily. Use as instructed twice daily, check fasting sugar in morning and check before bedtime. E11.8 10/03/17   [name], MD  clopidogrel (PLAVIX) 75 MG tablet TAKE 1 TABLET (75 MG TOTAL) BY MOUTH DAILY. 05/26/20 05/26/21  [name], MD  collagenase (SANTYL) ointment Apply 1  application topically daily. 05/22/20   Felipa Furnace, DPM  collagenase (SANTYL) ointment Apply 1 application topically daily. Apply Santyl Ointment to scab on left 3rd and 4th digits. Measurements: Left 3rd toe 1.0 x 0.6 cm Left 4th toe: 2.0 x 1.0 cm 05/22/20   Felipa Furnace, DPM  collagenase (SANTYL) ointment Apply 1 application topically daily. 05/22/20   Felipa Furnace, DPM  Continuous Blood Gluc Receiver (FREESTYLE LIBRE 14 DAY READER) DEVI 1 each by Does not apply route See admin instructions. Use as directed to check blood glucose 09/20/19   [name], MD  Continuous Blood Gluc Sensor (FREESTYLE LIBRE 14 DAY SENSOR) MISC INJECT IN THE SKIN EVERY 14 (FOURTEEN) DAYS. E11.69 Z79.4 09/20/19   [name], MD  dapagliflozin propanediol (FARXIGA) 5 MG TABS tablet Take 1 tablet (5 mg total) by mouth daily before breakfast. 01/21/20   [name], MD  gabapentin (NEURONTIN) 300 MG capsule Take 1 capsule (300 mg total) by mouth 2 (two) times daily. Patient taking differently: Take 300 mg by mouth 2 (  two) times daily as needed (pain). 01/07/20   [name], MD  glucose blood test strip Use as instructed twice daily, check fasting sugar in morning and check before bedtime. E11.8 10/03/17   [name], MD  insulin NPH-regular Human (NOVOLIN 70/30) (70-30) 100 UNIT/ML injection Inject 12 Units into the skin 2 (two) times daily with a meal. 02/26/20   [name], MD  Insulin Syringe-Needle U-100 (TRUEPLUS INSULIN SYRINGE) 31G X 5/16" 0.3 ML MISC Use as instructed to inject insulin daily. 12/31/19   [name], MD  lisinopril-hydrochlorothiazide (ZESTORETIC) 20-12.5 MG tablet Take 2 tablets by mouth daily. 02/26/20   [name], MD  metFORMIN (GLUCOPHAGE) 1000 MG tablet Take 1 tablet (1,000 mg total) by mouth 2 (two) times daily with a meal. 01/07/20   [name], MD  metoprolol tartrate (LOPRESSOR) 100 MG tablet TAKE 1 TABLET TWICE DAILY 05/20/20   [name], MD   ondansetron (ZOFRAN) 4 MG tablet Take 1 tablet (4 mg total) by mouth every 8 (eight) hours as needed for nausea or vomiting. 11/24/19   Edrick Kins, DPM  ONETOUCH DELICA LANCETS 08X MISC Use as instructed twice daily, check fasting sugar in morning and check before bedtime. E11.8 10/03/17   [name], MD  rosuvastatin (CRESTOR) 40 MG tablet Take 1 tablet (40 mg total) by mouth daily. To lower cholesterol 03/06/20   [name], MD  sildenafil (VIAGRA) 50 MG tablet TAKE 2 TABLETS DAILY AS NEEDED FOR ERECTILE DYSFUNCTION 05/20/20   [name], MD  silver sulfADIAZINE (SILVADENE) 1 % cream Apply to affected toes once daily and cover with dressing. 08/20/19   Marzetta Board, DPM  traMADol (ULTRAM) 50 MG tablet Take 1 tablet (50 mg total) by mouth every 6 (six) hours as needed for moderate pain. 02/04/20   Ulyses Amor, PA-C    Allergies    Percocet [oxycodone-acetaminophen]  Review of Systems   Review of Systems  Constitutional: Positive for diaphoresis. Negative for chills and fever.  HENT: Negative for congestion and facial swelling.   Eyes: Negative for discharge and visual disturbance.  Respiratory: Negative for shortness of breath.   Cardiovascular: Negative for chest pain and palpitations.  Gastrointestinal: Negative for abdominal pain, diarrhea and vomiting.  Musculoskeletal: Negative for arthralgias and myalgias.  Skin: Negative for color change and rash.  Neurological: Negative for tremors, syncope and headaches.  Psychiatric/Behavioral: Negative for confusion and dysphoric mood.    Physical Exam Updated Vital Signs BP (!) 177/102   Pulse 82   Temp 97.7 F (36.5 C) (Oral)   Resp 19   Ht '5\' 7"'  (1.702 m)   Wt 83 kg   SpO2 99%   BMI 28.66 kg/m   Physical Exam Vitals and nursing note reviewed.  Constitutional:      Appearance: He is well-developed and well-nourished.  HENT:     Head: Normocephalic and atraumatic.  Eyes:     Extraocular Movements: EOM  normal.     Pupils: Pupils are equal, round, and reactive to light.  Neck:     Vascular: No JVD.  Cardiovascular:     Rate and Rhythm: Normal rate and regular rhythm.     Pulses:          Radial pulses are 2+ on the right side and 2+ on the left side.     Heart sounds: Murmur heard.  No friction rub. No gallop.      Comments: Systolic murmur with radiation to the apex. Pulmonary:  Effort: No respiratory distress.     Breath sounds: No wheezing.  Abdominal:     General: There is no distension.     Tenderness: There is no abdominal tenderness. There is no guarding or rebound.  Musculoskeletal:        General: Normal range of motion.     Cervical back: Normal range of motion and neck supple.  Skin:    Coloration: Skin is not pale.     Findings: No rash.  Neurological:     Mental Status: He is alert and oriented to person, place, and time.  Psychiatric:        Mood and Affect: Mood and affect normal.        Behavior: Behavior normal.     ED Results / Procedures / Treatments   Labs (all labs ordered are listed, but only abnormal results are displayed) Labs Reviewed  BASIC METABOLIC PANEL - Abnormal; Notable for the following components:      Result Value   Glucose, Bld 173 (*)    BUN 31 (*)    Creatinine, Ser 1.47 (*)    GFR, Estimated 51 (*)    All other components within normal limits  TROPONIN I (HIGH SENSITIVITY) - Abnormal; Notable for the following components:   Troponin I (High Sensitivity) 33 (*)    All other components within normal limits  TROPONIN I (HIGH SENSITIVITY) - Abnormal; Notable for the following components:   Troponin I (High Sensitivity) 35 (*)    All other components within normal limits  CBC  URINALYSIS, ROUTINE W REFLEX MICROSCOPIC  CBG MONITORING, ED    EKG None  Radiology CT Angio Chest/Abd/Pel for Dissection W and/or Wo Contrast  Result Date: 05/27/2020 CLINICAL DATA:  Hypertension with diaphoresis EXAM: CT ANGIOGRAPHY CHEST, ABDOMEN  AND PELVIS TECHNIQUE: Non-contrast CT of the chest was initially obtained. Multidetector CT imaging through the chest, abdomen and pelvis was performed using the standard protocol during bolus administration of intravenous contrast. Multiplanar reconstructed images and MIPs were obtained and reviewed to evaluate the vascular anatomy. CONTRAST:  177m OMNIPAQUE IOHEXOL 350 MG/ML SOLN COMPARISON:  08/12/2012 FINDINGS: CTA CHEST FINDINGS Cardiovascular: Initial precontrast images demonstrate no hyperdense crescent to suggest aortic injury. Atherosclerotic calcifications of the thoracic aorta are noted without aneurysmal dilatation. No evidence of dissection or intimal injury is seen. No cardiac enlargement is noted. The pulmonary artery shows a normal branching pattern. No filling defects to suggest pulmonary emboli are identified. Heavy coronary calcifications are noted as well. Mediastinum/Nodes: The thoracic inlet is within normal limits. The esophagus is unremarkable. No sizable hilar or mediastinal adenopathy is noted. Lungs/Pleura: Lungs are well aerated bilaterally. No focal infiltrate or sizable effusion is seen. Musculoskeletal: Degenerative changes of the thoracic spine are noted. No acute bony abnormality is noted. Review of the MIP images confirms the above findings. CTA ABDOMEN AND PELVIS FINDINGS VASCULAR Aorta: Abdominal aorta demonstrates atherosclerotic calcifications without aneurysmal dilatation or dissection. Celiac: Patent without evidence of aneurysm, dissection, vasculitis or significant stenosis. SMA: Patent without evidence of aneurysm, dissection, vasculitis or significant stenosis. Renals: Both renal arteries are patent without evidence of aneurysm, dissection, vasculitis, fibromuscular dysplasia or significant stenosis. IMA: Patent without evidence of aneurysm, dissection, vasculitis or significant stenosis. Iliacs: Iliacs demonstrate mild atherosclerotic calcification without aneurysmal  dilatation or narrowing. Veins: No specific venous abnormality is noted. Review of the MIP images confirms the above findings. NON-VASCULAR Hepatobiliary: No focal liver abnormality is seen. No gallstones, gallbladder wall thickening, or biliary dilatation. Pancreas:  Unremarkable. No pancreatic ductal dilatation or surrounding inflammatory changes. Spleen: Normal in size without focal abnormality. Adrenals/Urinary Tract: Adrenal glands are within normal limits bilaterally. Kidneys demonstrate a normal enhancement pattern bilaterally. No renal calculi or obstructive changes are seen. The ureters are within normal limits. The bladder is partially distended. Stomach/Bowel: Appendix is within normal limits. No obstructive or inflammatory changes of the colon are seen. Small bowel is within normal limits. Stomach is unremarkable. Lymphatic: No specific lymphadenopathy is seen. Reproductive: Prostate is unremarkable. Other: No abdominal wall hernia or abnormality. No abdominopelvic ascites. Musculoskeletal: Increased trabeculation is noted within the pelvis particularly on the right but to a lesser degree in the sacrum and left iliac bone stable in appearance from the prior exam. This is again most consistent with Paget's disease. No definitive lytic or sclerotic lesions to suggest metastatic disease are seen. Review of the MIP images confirms the above findings. IMPRESSION: No evidence of aortic aneurysm or dissection. No pulmonary emboli are seen. Bony changes stable from the prior exam likely representing Paget's disease. Aortic Atherosclerosis (ICD10-I70.0). Electronically Signed   By: Inez Catalina M.D.   On: 05/27/2020 19:50    Procedures Procedures   Medications Ordered in ED Medications  iohexol (OMNIPAQUE) 350 MG/ML injection 100 mL (100 mLs Intravenous Contrast Given 05/27/20 1940)    ED Course  I have reviewed the triage vital signs and the nursing notes.  Pertinent labs & imaging results that were  available during my care of the patient were reviewed by me and considered in my medical decision making (see chart for details).    MDM Rules/Calculators/A&P                          70 yo M with a chief complaints of diaphoresis.  This occurred when he went into his orthopedic doctor's office.  He denied any other symptoms.  Has been having left knee pain for the past couple weeks which she went to the orthopedist for.  On my exam he has murmur though not completely consistent with aortic stenosis.  Has a history of moderate to severe aortic stenosis.  Will obtain a laboratory evaluation.  CT angiogram of the chest and pelvis is negative for acute dissection or PE.  Patient continues to be asymptomatic.  Troponins are essentially unchanged.  He was able to eat and ambulate without issue.  He is still hypertensive.  He is requesting discharge home.  PCP and cardiology follow-up.  11:01 PM:  I have discussed the diagnosis/risks/treatment options with the patient and believe the pt to be eligible for discharge home to follow-up with PCP. We also discussed returning to the ED immediately if new or worsening sx occur. We discussed the sx which are most concerning (e.g., sudden worsening pain, fever, inability to tolerate by mouth) that necessitate immediate return. Medications administered to the patient during their visit and any new prescriptions provided to the patient are listed below.  Medications given during this visit Medications  iohexol (OMNIPAQUE) 350 MG/ML injection 100 mL (100 mLs Intravenous Contrast Given 05/27/20 1940)     The patient appears reasonably screen and/or stabilized for discharge and I doubt any other medical condition or other [facility] requiring further screening, evaluation, or treatment in the ED at this time prior to discharge.   Final Clinical Impression(s) / ED Diagnoses Final diagnoses:  Hypertension, unspecified type  Diaphoresis    Rx / DC Orders ED Discharge  Orders  None       Deno Etienne, DO 05/27/20 2301

## 2020-05-27 NOTE — ED Notes (Signed)
Patient transported to CT 

## 2020-05-27 NOTE — Discharge Instructions (Signed)
Your blood pressure was high today.  Please follow-up with your cardiologist and your family doctor.  Please return for chest pain trouble breathing headache or neck pain or if you have one-sided numbness or weakness difficulty with speech or swallowing.

## 2020-06-01 ENCOUNTER — Other Ambulatory Visit: Payer: Self-pay | Admitting: Family Medicine

## 2020-06-01 DIAGNOSIS — N529 Male erectile dysfunction, unspecified: Secondary | ICD-10-CM

## 2020-06-01 NOTE — Telephone Encounter (Signed)
Requested medications are due for refill today Got 18 pills 7 days ago but is ordered 2 x day if needed. Requested medications are on the active medication list yes  Last refill 3/7  Last visit 12/2019 that addressed ED  Future visit scheduled 07/2020  Notes to clinic Given 18 last week, unsure if wanted him to come in sooner than May.

## 2020-06-01 NOTE — Telephone Encounter (Signed)
Requested medications are due for refill today Got 18 pills 7 days ago but is ordered 2 x day if needed. Requested medications are on the active medication list yes   Last refill 3/7   Last visit 12/2019 that addressed ED   Future visit scheduled 07/2020   Notes to clinic Given 18 last week, unsure if wanted him to come in sooner than May.

## 2020-06-03 ENCOUNTER — Ambulatory Visit (HOSPITAL_COMMUNITY)
Admission: RE | Admit: 2020-06-03 | Discharge: 2020-06-03 | Disposition: A | Discharge status: Home / Self Care | Payer: Medicare HMO | Source: Ambulatory Visit | Attending: Cardiovascular Disease | Admitting: Cardiovascular Disease

## 2020-06-03 ENCOUNTER — Other Ambulatory Visit: Payer: Self-pay

## 2020-06-03 DIAGNOSIS — I739 Peripheral vascular disease, unspecified: Secondary | ICD-10-CM | POA: Diagnosis not present

## 2020-06-03 DIAGNOSIS — I70229 Atherosclerosis of native arteries of extremities with rest pain, unspecified extremity: Secondary | ICD-10-CM

## 2020-06-03 DIAGNOSIS — Z9889 Other specified postprocedural states: Secondary | ICD-10-CM

## 2020-06-05 ENCOUNTER — Encounter: Payer: Self-pay | Admitting: Cardiovascular Disease

## 2020-06-05 ENCOUNTER — Other Ambulatory Visit: Payer: Self-pay

## 2020-06-05 ENCOUNTER — Ambulatory Visit (INDEPENDENT_AMBULATORY_CARE_PROVIDER_SITE_OTHER): Payer: Medicare Other | Admitting: Cardiovascular Disease

## 2020-06-05 VITALS — BP 168/100 | HR 116 | Ht 68.0 in | Wt 176.6 lb

## 2020-06-05 DIAGNOSIS — I70229 Atherosclerosis of native arteries of extremities with rest pain, unspecified extremity: Secondary | ICD-10-CM

## 2020-06-05 DIAGNOSIS — I739 Peripheral vascular disease, unspecified: Secondary | ICD-10-CM | POA: Diagnosis not present

## 2020-06-05 DIAGNOSIS — I779 Disorder of arteries and arterioles, unspecified: Secondary | ICD-10-CM

## 2020-06-05 DIAGNOSIS — I10 Essential (primary) hypertension: Secondary | ICD-10-CM | POA: Diagnosis not present

## 2020-06-05 DIAGNOSIS — Z9889 Other specified postprocedural states: Secondary | ICD-10-CM

## 2020-06-05 DIAGNOSIS — I350 Nonrheumatic aortic (valve) stenosis: Secondary | ICD-10-CM | POA: Diagnosis not present

## 2020-06-05 DIAGNOSIS — E782 Mixed hyperlipidemia: Secondary | ICD-10-CM

## 2020-06-05 LAB — LIPID PANEL
Chol/HDL Ratio: 2 ratio (ref 0.0–5.0)
Cholesterol, Total: 106 mg/dL (ref 100–199)
HDL: 53 mg/dL (ref >39)
LDL Chol Calc (NIH): 29 mg/dL (ref 0–99)
Triglycerides: 138 mg/dL (ref 0–149)
VLDL Cholesterol Cal: 24 mg/dL (ref 5–40)

## 2020-06-05 LAB — HEPATIC FUNCTION PANEL
ALT: 24 IU/L (ref 0–44)
AST: 26 IU/L (ref 0–40)
Albumin: 4.5 g/dL (ref 3.8–4.8)
Alkaline Phosphatase: 130 IU/L — ABNORMAL HIGH (ref 44–121)
Bilirubin Total: 0.2 mg/dL (ref 0.0–1.2)
Bilirubin, Direct: <0.1 mg/dL (ref 0.00–0.40)
Total Protein: 7.5 g/dL (ref 6.0–8.5)

## 2020-06-05 NOTE — Assessment & Plan Note (Signed)
History of hyperlipidemia on high-dose statin therapy with lipid profile performed 01/15/2019 revealing total cholesterol of 129, LDL 61 and HDL 47.

## 2020-06-05 NOTE — Patient Instructions (Signed)
Medication Instructions:  Your physician recommends that you continue on your current medications as directed. Please refer to the Current Medication list given to you today.  *If you need a refill on your cardiac medications before your next appointment, please call your pharmacy*   Lab Work: Your physician recommends that you have labs drawn today: lipid/liver profile  If you have labs (blood work) drawn today and your tests are completely normal, you will receive your results only by: Marland Kitchen MyChart Message (if you have MyChart) OR . A paper copy in the mail If you have any lab test that is abnormal or we need to change your treatment, we will call you to review the results.   Testing/Procedures: Your physician has requested that you have a renal artery duplex. During this test, an ultrasound is used to evaluate blood flow to the kidneys. Allow one hour for this exam. Do not eat after midnight the day before and avoid carbonated beverages. Take your medications as you usually do. This procedure is done at [place]. 2nd Floor   Your physician has requested that you have an echocardiogram. Echocardiography is a painless test that uses sound waves to create images of your heart. It provides your doctor with information about the size and shape of your heart and how well your heart's chambers and valves are working. This procedure takes approximately one hour. There are no restrictions for this procedure. This procedure is done at [address]  -To be done in June 2022.  Your physician has requested that you have a lower extremity arterial duplex. This test is an ultrasound of the arteries in the legs. It looks at arterial blood flow in the legs. Allow one hour for Lower Arterial scans. There are no restrictions or special instructions. This procedure is done at [place]. 2nd Floor  -To be done in March of 2023.    Follow-Up: At [facility], you and your  health needs are our priority.  As part of our continuing mission to provide you with exceptional heart care, we have created designated Provider Care Teams.  These Care Teams include your primary Cardiologist (physician) and Advanced Practice Providers (APPs -  Physician Assistants and Nurse Practitioners) who all work together to provide you with the care you need, when you need it.  We recommend signing up for the patient portal called "MyChart".  Sign up information is provided on this After Visit Summary.  MyChart is used to connect with patients for Virtual Visits (Telemedicine).  Patients are able to view lab/test results, encounter notes, upcoming appointments, etc.  Non-urgent messages can be sent to your provider as well.   To learn more about what you can do with MyChart, go to [email or web address].    Your next appointment:   6 month(s)  The format for your next appointment:   In Person  Provider:   Quay Burow, MD

## 2020-06-05 NOTE — Assessment & Plan Note (Signed)
History of critical limb ischemia status post right SFA intervention by myself 08/08/2019 with staged left popliteal CTO intervention using a 5 mm x 60 mm long [place] self-expanding stent 11/18/2019.  He had two-vessel runoff on the right and 1 on the left.  He denies claudication.  Recent Doppler studies reveal a decline in his left ABI to 0.58 with an occluded left popliteal stent.  We will continue to follow his Dopplers on annual basis.

## 2020-06-05 NOTE — Assessment & Plan Note (Signed)
History of moderate to severe aortic stenosis by 2D echo performed 08/27/2019.  He had mild to moderate LV dysfunction with an EF of 40 to 45% range with an aortic valve area 1.03 cm and a peak gradient of 22 mmHg.  He is completely asymptomatic however.  We will repeat his 2D echo in June.  We did talk about the possibility of TAVR.

## 2020-06-05 NOTE — Assessment & Plan Note (Signed)
History of essential hypertension blood pressure measured today at 160/90.  He has not taken his medications yet today.  Usually his blood pressure falls to the 140/80 range after taking his medications at home.  Am going to get a renal duplex to further evaluate although on his angiogram performed 5/21 I did visualize his left renal artery which appeared to be widely patent.

## 2020-06-05 NOTE — Progress Notes (Signed)
   06/05/2020 Caleb [name]   07/11/1950  [id number]  Primary Physician Newlin, Enobong, MD Primary Cardiologist: Jonathan J Berry MD FACP, FACC, FAHA, FSCAI  HPI:  Caleb [name] is a 70 y.o.  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 Galloway, his podiatrist for evaluation of critical limb ischemia.I last saw him in the office  12/02/2019.His wife of 44 years did die of ovarian cancer 3 years ago but he is very positive about meeting somebody new in his life. He is very active and swims 4 days a week, walks 3 to 4 days a week and belongs to fitness. He denies chest pain or shortness of breath. His cardiac risk factors are notable for treated hypertension, diabetes and hyperlipidemia. His father did die of a myocardial infarction age 56. He is never had a heart attack or stroke. He does complain of lifestyle and claudication however. He developed wounds on his toes after walking 5 miles about [place] with new shoes and has been treated by Dr. Galloway since that time with slow progression of wound healing.  I performed peripheral angiography on him 08/08/2019 and performed atherectomy and drug-coated balloon angioplasty of high-grade right SFA and popliteal stenosis with two-vessel runoff. This resulted in an improvement in his claudication symptoms, healing of his right foot wounds and improvement in his Doppler studies. He did have an occluded left popliteal artery which I will address in a staged fashion in the near future although his wounds on his left foot are slowly healing. I also performed carotid Doppler studies that showed high-grade right greater than left ICA stenosis and have referred him to vascular surgery for consideration of endarterectomy. He denies chest pain or shortness of breath. He does have mild aortic stenosis by 2D echo 2  years ago and a high-pitched outflow tract murmur. Wecheck a 2D echo on 08/27/2019 that showed a decline in his EF from 50 to 55% down to 40 to 45% with moderate to severe aortic stenosis. A Myoview stress test performed 08/30/2019 showed inferior scar without ischemia. He really denies chest pain or shortness of breath.  He is referred back by Dr. Fields because of progressive gangrene in his left fourth toe. His angiogram which I performed 08/08/2019 did show an occluded left popliteal artery with occluded posterior tibial and peroneal and high-grade AT disease.  Because of critical limb ischemia we decided to proceed with early intervention of his left popliteal CTO.  I performed left popliteal CTO intervention on 11/18/2019 placing a 5 mm x 60 mm long Tigris self-expanding stent.  He had one-vessel runoff via a large anterior tibial artery.  He was discharged home the following day.  His follow-up Dopplers performed 11/28/2019 revealed an increase in his left ABI from 0.76 up to 1.02.  He did have his left fourth and fifth toes amputated by Dr. Patel on 11/22/2019 and these are healing nicely.  He no longer has claudication.  He has undergone bilateral staged TCAR by Dr. Fields recently.  He currently denies chest pain, shortness of breath or claudication.  Recent lower extremity arterial Doppler study performed 06/03/2020 revealed a decline in his left ABI to 0.58 with an occluded left popliteal artery stent.   Current Meds  Medication Sig  . aspirin EC 81 MG tablet Take 81 mg by mouth daily.  . Blood Glucose Monitoring Suppl (ONETOUCH VERIO)   w/Device KIT 1 each by Does not apply route 2 (two) times daily. Use as instructed twice daily, check fasting sugar in morning and check before bedtime. E11.8  . clopidogrel (PLAVIX) 75 MG tablet TAKE 1 TABLET (75 MG TOTAL) BY MOUTH DAILY.  . collagenase (SANTYL) ointment Apply 1 application topically daily.  . collagenase (SANTYL) ointment Apply 1 application  topically daily. Apply Santyl Ointment to scab on left 3rd and 4th digits. Measurements: Left 3rd toe 1.0 x 0.6 cm Left 4th toe: 2.0 x 1.0 cm  . collagenase (SANTYL) ointment Apply 1 application topically daily.  . Continuous Blood Gluc Receiver (FREESTYLE LIBRE 14 DAY READER) DEVI 1 each by Does not apply route See admin instructions. Use as directed to check blood glucose  . Continuous Blood Gluc Sensor (FREESTYLE LIBRE 14 DAY SENSOR) MISC INJECT IN THE SKIN EVERY 14 (FOURTEEN) DAYS. E11.69 Z79.4  . dapagliflozin propanediol (FARXIGA) 5 MG TABS tablet Take 1 tablet (5 mg total) by mouth daily before breakfast.  . gabapentin (NEURONTIN) 300 MG capsule Take 1 capsule (300 mg total) by mouth 2 (two) times daily. (Patient taking differently: Take 300 mg by mouth 2 (two) times daily as needed (pain).)  . glucose blood test strip Use as instructed twice daily, check fasting sugar in morning and check before bedtime. E11.8  . insulin NPH-regular Human (NOVOLIN 70/30) (70-30) 100 UNIT/ML injection Inject 12 Units into the skin 2 (two) times daily with a meal.  . Insulin Syringe-Needle U-100 (TRUEPLUS INSULIN SYRINGE) 31G X 5/16" 0.3 ML MISC Use as instructed to inject insulin daily.  Marland Kitchen lisinopril-hydrochlorothiazide (ZESTORETIC) 20-12.5 MG tablet Take 2 tablets by mouth daily.  . metFORMIN (GLUCOPHAGE) 1000 MG tablet Take 1 tablet (1,000 mg total) by mouth 2 (two) times daily with a meal.  . metoprolol tartrate (LOPRESSOR) 100 MG tablet TAKE 1 TABLET TWICE DAILY  . ondansetron (ZOFRAN) 4 MG tablet Take 1 tablet (4 mg total) by mouth every 8 (eight) hours as needed for nausea or vomiting.  Glory Rosebush DELICA LANCETS 75F MISC Use as instructed twice daily, check fasting sugar in morning and check before bedtime. E11.8  . rosuvastatin (CRESTOR) 40 MG tablet Take 1 tablet (40 mg total) by mouth daily. To lower cholesterol  . sildenafil (VIAGRA) 50 MG tablet TAKE 2 TABLETS DAILY AS NEEDED FOR ERECTILE DYSFUNCTION   . silver sulfADIAZINE (SILVADENE) 1 % cream Apply to affected toes once daily and cover with dressing.  . traMADol (ULTRAM) 50 MG tablet Take 1 tablet (50 mg total) by mouth every 6 (six) hours as needed for moderate pain.   Current Facility-Administered Medications for the 06/05/20 encounter (Office Visit) with Lorretta Harp, MD  Medication  . sodium chloride flush (NS) 0.9 % injection 3 mL  . sodium chloride flush (NS) 0.9 % injection 3 mL     Allergies  Allergen Reactions  . Percocet [Oxycodone-Acetaminophen] Nausea And Vomiting    10/325. Pt has 3 doses of this medication. N/V, temperature 97, no chills and slight sweating. Pt dressing was C/D/I no drainage noted.     Social History   Socioeconomic History  . Marital status: Widowed    Spouse name: Not on file  . Number of children: Not on file  . Years of education: Not on file  . Highest education level: Not on file  Occupational History  . Occupation: Caregiver    Comment: Special needs  Tobacco Use  . Smoking status: Never Smoker  . Smokeless tobacco: Never  Used  Vaping Use  . Vaping Use: Never used  Substance and Sexual Activity  . Alcohol use: Not Currently    Comment: rare  . Drug use: No  . Sexual activity: Not Currently  Other Topics Concern  . Not on file  Social History Narrative   Widower.  3 daughters and 3 grandchildren.     Social Determinants of Health   Financial Resource Strain: Not on file  Food Insecurity: Not on file  Transportation Needs: Not on file  Physical Activity: Not on file  Stress: Not on file  Social Connections: Not on file  Intimate Partner Violence: Not on file     Review of Systems: General: negative for chills, fever, night sweats or weight changes.  Cardiovascular: negative for chest pain, dyspnea on exertion, edema, orthopnea, palpitations, paroxysmal nocturnal dyspnea or shortness of breath Dermatological: negative for rash Respiratory: negative for cough or  wheezing Urologic: negative for hematuria Abdominal: negative for nausea, vomiting, diarrhea, bright red blood per rectum, melena, or hematemesis Neurologic: negative for visual changes, syncope, or dizziness All other systems reviewed and are otherwise negative except as noted above.    Blood pressure (!) 168/100, pulse (!) 116, height 5' 8" (1.727 m), weight 176 lb 9.6 oz (80.1 kg), SpO2 98 %.  General appearance: alert and no distress Neck: no adenopathy, no JVD, supple, symmetrical, trachea midline, thyroid not enlarged, symmetric, no tenderness/mass/nodules and Soft bilateral carotid bruits Lungs: clear to auscultation bilaterally Heart: 2/6 systolic ejection murmur at the base consistent with aortic stenosis Extremities: extremities normal, atraumatic, no cyanosis or edema Pulses: Diminished left pedal pulse Skin: Skin color, texture, turgor normal. No rashes or lesions Neurologic: Alert and oriented X 3, normal strength and tone. Normal symmetric reflexes. Normal coordination and gait  EKG sinus tachycardia 116 with LVH voltage.  I personally reviewed this EKG.  ASSESSMENT AND PLAN:   Hypertension History of essential hypertension blood pressure measured today at 160/90.  He has not taken his medications yet today.  Usually his blood pressure falls to the 140/80 range after taking his medications at home.  Am going to get a renal duplex to further evaluate although on his angiogram performed 5/21 I did visualize his left renal artery which appeared to be widely patent.  Hyperlipidemia History of hyperlipidemia on high-dose statin therapy with lipid profile performed 01/15/2019 revealing total cholesterol of 129, LDL 61 and HDL 47.  Critical limb ischemia with history of revascularization of same extremity History of critical limb ischemia status post right SFA intervention by myself 08/08/2019 with staged left popliteal CTO intervention using a 5 mm x 60 mm long Tigris  self-expanding stent 11/18/2019.  He had two-vessel runoff on the right and 1 on the left.  He denies claudication.  Recent Doppler studies reveal a decline in his left ABI to 0.58 with an occluded left popliteal stent.  We will continue to follow his Dopplers on annual basis.  Carotid artery disease (HCC) History of carotid artery disease status post staged right and left internal carotid TCAR by Dr. Fields.  Moderate aortic stenosis History of moderate to severe aortic stenosis by 2D echo performed 08/27/2019.  He had mild to moderate LV dysfunction with an EF of 40 to 45% range with an aortic valve area 1.03 cm and a peak gradient of 22 mmHg.  He is completely asymptomatic however.  We will repeat his 2D echo in June.  We did talk about the possibility of TAVR.        Lorretta Harp MD FACP,FACC,FAHA, [facility] 06/05/2020 9:17 AM

## 2020-06-05 NOTE — Assessment & Plan Note (Signed)
History of carotid artery disease status post staged right and left internal carotid TCAR by Dr. Oneida Alar.

## 2020-06-12 ENCOUNTER — Other Ambulatory Visit: Payer: Self-pay

## 2020-06-12 ENCOUNTER — Ambulatory Visit (INDEPENDENT_AMBULATORY_CARE_PROVIDER_SITE_OTHER): Payer: Medicare HMO | Admitting: Podiatry

## 2020-06-12 ENCOUNTER — Ambulatory Visit: Payer: Medicare HMO | Admitting: Podiatry

## 2020-06-12 DIAGNOSIS — E11621 Type 2 diabetes mellitus with foot ulcer: Secondary | ICD-10-CM

## 2020-06-12 DIAGNOSIS — L97422 Non-pressure chronic ulcer of left heel and midfoot with fat layer exposed: Secondary | ICD-10-CM | POA: Diagnosis not present

## 2020-06-15 ENCOUNTER — Other Ambulatory Visit: Payer: Self-pay | Admitting: Family Medicine

## 2020-06-15 DIAGNOSIS — E1159 Type 2 diabetes mellitus with other circulatory complications: Secondary | ICD-10-CM

## 2020-06-15 DIAGNOSIS — I152 Hypertension secondary to endocrine disorders: Secondary | ICD-10-CM

## 2020-06-16 ENCOUNTER — Encounter: Payer: Self-pay | Admitting: Podiatry

## 2020-06-16 NOTE — Progress Notes (Signed)
Subjective:  Patient ID: Caleb [name], male    DOB: 27-Feb-1951,  MRN: [id number]  Chief Complaint  Patient presents with  . skin fissure    PT stated that he is doing well he has some soreness  Nail trim    70 y.o. male presents with the above complaint.  Patient presents with follow-up of left heel ulceration.  Patient states is doing well.  He has been applying Santyl to the area.  He is a diabetic with last A1c of 7.7.  He denies any other acute complaints.  He still has some soreness.  He states that it seems to be getting a little bit better.   Review of Systems: Negative except as noted in the HPI. Denies N/V/F/Ch.  Past Medical History:  Diagnosis Date  . Aortic stenosis   . Carotid artery occlusion   . Diabetes mellitus without complication ([place])   . Dyslipidemia 09/27/2012  . Erectile dysfunction 09/27/2012  . Heart murmur   . Hypercholesteremia   . Hyperlipidemia 08/12/2012  . Hypertension   . Hyponatremia 08/14/2012  . Neuropathy   . Pancreatitis   . Pancreatitis, acute 09/27/2012  . Peripheral vascular disease ([place])   . Vitamin D deficiency 06/23/2015    Current Outpatient Medications:  .  aspirin EC 81 MG tablet, Take 81 mg by mouth daily., Disp: , Rfl:  .  Blood Glucose Monitoring Suppl (ONETOUCH VERIO) w/Device KIT, 1 each by Does not apply route 2 (two) times daily. Use as instructed twice daily, check fasting sugar in morning and check before bedtime. E11.8, Disp: 1 kit, Rfl: 0 .  clopidogrel (PLAVIX) 75 MG tablet, TAKE 1 TABLET (75 MG TOTAL) BY MOUTH DAILY., Disp: 30 tablet, Rfl: 11 .  collagenase (SANTYL) ointment, Apply 1 application topically daily., Disp: 15 g, Rfl: 0 .  collagenase (SANTYL) ointment, Apply 1 application topically daily. Apply Santyl Ointment to scab on left 3rd and 4th digits. Measurements: Left 3rd toe 1.0 x 0.6 cm Left 4th toe: 2.0 x 1.0 cm, Disp: 30 g, Rfl: 0 .  collagenase (SANTYL) ointment, Apply 1 application topically daily., Disp: 90  each, Rfl: 0 .  Continuous Blood Gluc Receiver (FREESTYLE LIBRE 14 DAY READER) DEVI, 1 each by Does not apply route See admin instructions. Use as directed to check blood glucose, Disp: 1 each, Rfl: 0 .  Continuous Blood Gluc Sensor (FREESTYLE LIBRE 14 DAY SENSOR) MISC, INJECT IN THE SKIN EVERY 14 (FOURTEEN) DAYS. E11.69 Z79.4, Disp: 6 each, Rfl: 3 .  dapagliflozin propanediol (FARXIGA) 5 MG TABS tablet, Take 1 tablet (5 mg total) by mouth daily before breakfast., Disp: 90 tablet, Rfl: 1 .  gabapentin (NEURONTIN) 300 MG capsule, Take 1 capsule (300 mg total) by mouth 2 (two) times daily. (Patient taking differently: Take 300 mg by mouth 2 (two) times daily as needed (pain).), Disp: 180 capsule, Rfl: 1 .  glucose blood test strip, Use as instructed twice daily, check fasting sugar in morning and check before bedtime. E11.8, Disp: 100 each, Rfl: 12 .  insulin NPH-regular Human (NOVOLIN 70/30) (70-30) 100 UNIT/ML injection, Inject 12 Units into the skin 2 (two) times daily with a meal., Disp: 20 mL, Rfl: 6 .  Insulin Syringe-Needle U-100 (TRUEPLUS INSULIN SYRINGE) 31G X 5/16" 0.3 ML MISC, Use as instructed to inject insulin daily., Disp: 100 each, Rfl: 2 .  lisinopril-hydrochlorothiazide (ZESTORETIC) 20-12.5 MG tablet, TAKE 2 TABLETS EVERY DAY, Disp: 180 tablet, Rfl: 0 .  metFORMIN (GLUCOPHAGE) 1000 MG tablet, Take  1 tablet (1,000 mg total) by mouth 2 (two) times daily with a meal., Disp: 360 tablet, Rfl: 1 .  metoprolol tartrate (LOPRESSOR) 100 MG tablet, TAKE 1 TABLET TWICE DAILY, Disp: 180 tablet, Rfl: 0 .  ondansetron (ZOFRAN) 4 MG tablet, Take 1 tablet (4 mg total) by mouth every 8 (eight) hours as needed for nausea or vomiting., Disp: 30 tablet, Rfl: 0 .  ONETOUCH DELICA LANCETS 42A MISC, Use as instructed twice daily, check fasting sugar in morning and check before bedtime. E11.8, Disp: 100 each, Rfl: 12 .  rosuvastatin (CRESTOR) 40 MG tablet, Take 1 tablet (40 mg total) by mouth daily. To lower  cholesterol, Disp: 90 tablet, Rfl: 0 .  sildenafil (VIAGRA) 50 MG tablet, TAKE 2 TABLETS DAILY AS NEEDED FOR ERECTILE DYSFUNCTION, Disp: 18 tablet, Rfl: 1 .  silver sulfADIAZINE (SILVADENE) 1 % cream, Apply to affected toes once daily and cover with dressing., Disp: 400 g, Rfl: 1 .  traMADol (ULTRAM) 50 MG tablet, Take 1 tablet (50 mg total) by mouth every 6 (six) hours as needed for moderate pain., Disp: 12 tablet, Rfl: 0  Current Facility-Administered Medications:  .  sodium chloride flush (NS) 0.9 % injection 3 mL, 3 mL, Intravenous, Q12H, Lorretta Harp, MD .  sodium chloride flush (NS) 0.9 % injection 3 mL, 3 mL, Intravenous, Q12H, Lorretta Harp, MD  Social History   Tobacco Use  Smoking Status Never Smoker  Smokeless Tobacco Never Used    Allergies  Allergen Reactions  . Percocet [Oxycodone-Acetaminophen] Nausea And Vomiting    10/325. Pt has 3 doses of this medication. N/V, temperature 97, no chills and slight sweating. Pt dressing was C/D/I no drainage noted.    Objective:  There were no vitals filed for this visit. There is no height or weight on file to calculate BMI. Constitutional Well developed. Well nourished.  Vascular Dorsalis pedis pulses palpable bilaterally. Posterior tibial pulses palpable bilaterally. Capillary refill normal to all digits.  No cyanosis or clubbing noted. Pedal hair growth normal.  Neurologic Normal speech. Oriented to person, place, and time. Epicritic sensation to light touch grossly present bilaterally.  Dermatologic  severe dryness noted to the left heel with underlying skin fissure leading to exposure of the underlying fat layer of the skin.  No purulent drainage noted.  Does not probe down to deep tissue.  No malodor present.  No cellulitis present  Orthopedic: Normal joint ROM without pain or crepitus bilaterally. No visible deformities. No bony tenderness.   Radiographs: None Assessment:   1. Diabetic ulcer of left heel  associated with type 2 diabetes mellitus, with fat layer exposed ([place])    Plan:  Patient was evaluated and treated and all questions answered.  Left heel severe dry skin with underlying fissure -I explained the patient the etiology of fissure formation and his relationship with cirrhosis.  I explained to the patient the importance of moisturization to prevent severe dryness especially during wintertime that could prevent ulceration.  Given that is diabetic and high risk of losing his leg given the location of it I encouraged aggressive dressing changes.  Ulcer left heel with fat layer exposed -Debridement as below. -Dressed with Santyl wet-to-dry, DSD.   -Continue off-loading with cam boot -Cam boot was dispensed for offloading  Procedure: Excisional Debridement of Wound Tool: Sharp chisel blade/tissue nipper Rationale: Removal of non-viable soft tissue from the wound to promote healing.  Anesthesia: none Pre-Debridement Wound Measurements: 0.7 cm x 0.5 cm x 0.3 cm  Post-Debridement Wound Measurements: 0.9 cm x 0.6 cm x 0.3 cm Type of Debridement: Sharp Excisional Tissue Removed: Non-viable soft tissue Blood loss: Minimal (<50cc) Depth of Debridement: subcutaneous tissue. Technique: Sharp excisional debridement to bleeding, viable wound base.  Wound Progress: The wound is decreasing. Dressing: Dry, sterile, compression dressing. Disposition: Patient tolerated procedure well. Patient to return in 1 week for follow-up.  No follow-ups on file.     No follow-ups on file.

## 2020-06-17 ENCOUNTER — Ambulatory Visit (HOSPITAL_COMMUNITY)
Admission: RE | Admit: 2020-06-17 | Discharge: 2020-06-17 | Disposition: A | Discharge status: Home / Self Care | Payer: Medicare Other | Source: Ambulatory Visit | Attending: Cardiovascular Disease | Admitting: Cardiovascular Disease

## 2020-06-17 ENCOUNTER — Encounter (HOSPITAL_COMMUNITY): Payer: Self-pay

## 2020-06-17 ENCOUNTER — Other Ambulatory Visit: Payer: Self-pay

## 2020-06-17 ENCOUNTER — Ambulatory Visit (HOSPITAL_COMMUNITY)
Admission: RE | Admit: 2020-06-17 | Discharge: 2020-06-17 | Disposition: A | Discharge status: Home / Self Care | Payer: Medicare HMO | Source: Ambulatory Visit | Attending: Cardiovascular Disease | Admitting: Cardiovascular Disease

## 2020-06-17 DIAGNOSIS — I739 Peripheral vascular disease, unspecified: Secondary | ICD-10-CM | POA: Diagnosis not present

## 2020-06-17 DIAGNOSIS — I10 Essential (primary) hypertension: Secondary | ICD-10-CM | POA: Diagnosis not present

## 2020-06-20 ENCOUNTER — Other Ambulatory Visit: Payer: Self-pay

## 2020-06-20 DIAGNOSIS — E119 Type 2 diabetes mellitus without complications: Secondary | ICD-10-CM | POA: Diagnosis not present

## 2020-06-26 ENCOUNTER — Other Ambulatory Visit: Payer: Self-pay | Admitting: Family Medicine

## 2020-06-26 DIAGNOSIS — N529 Male erectile dysfunction, unspecified: Secondary | ICD-10-CM

## 2020-07-01 ENCOUNTER — Other Ambulatory Visit: Payer: Self-pay | Admitting: Family Medicine

## 2020-07-01 DIAGNOSIS — E7800 Pure hypercholesterolemia, unspecified: Secondary | ICD-10-CM

## 2020-07-01 MED ORDER — ROSUVASTATIN CALCIUM 40 MG PO TABS: 40.0000 mg | ORAL_TABLET | Freq: Every day | ORAL | 1 refills | Status: DC

## 2020-07-01 NOTE — Telephone Encounter (Signed)
Medication Refill - Medication:   rosuvastatin (CRESTOR) 40 MG tablet   Preferred Pharmacy (with phone number or street name):  [place], [place] Phone:  [phone]  Fax:  [phone]       Agent: Please be advised that RX refills may take up to 3 business days. We ask that you follow-up with your pharmacy.

## 2020-07-10 ENCOUNTER — Encounter: Payer: Self-pay | Admitting: Podiatry

## 2020-07-10 ENCOUNTER — Other Ambulatory Visit: Payer: Self-pay

## 2020-07-10 ENCOUNTER — Ambulatory Visit (INDEPENDENT_AMBULATORY_CARE_PROVIDER_SITE_OTHER): Payer: Medicare HMO | Admitting: Podiatry

## 2020-07-10 DIAGNOSIS — R234 Changes in skin texture: Secondary | ICD-10-CM

## 2020-07-10 DIAGNOSIS — L97422 Non-pressure chronic ulcer of left heel and midfoot with fat layer exposed: Secondary | ICD-10-CM

## 2020-07-10 DIAGNOSIS — E11621 Type 2 diabetes mellitus with foot ulcer: Secondary | ICD-10-CM

## 2020-07-10 NOTE — Progress Notes (Signed)
Subjective:  Patient ID: Caleb [name], male    DOB: June 15, 1950,  MRN: [id number]  Chief Complaint  Patient presents with  . Diabetic Ulcer    Left heel PT stated that he is doing well he  has no pain and no concerns at this time     70 y.o. male presents with the above complaint.  Patient presents follow-up left ulceration.  The ulceration has completely epithelialized.  Patient has been moisturizing and helping with the dry skin.  He is a diabetic with last A1c of 7.7.  He denies any other acute complaints.   Review of Systems: Negative except as noted in the HPI. Denies N/V/F/Ch.  Past Medical History:  Diagnosis Date  . Aortic stenosis   . Carotid artery occlusion   . Diabetes mellitus without complication ([place])   . Dyslipidemia 09/27/2012  . Erectile dysfunction 09/27/2012  . Heart murmur   . Hypercholesteremia   . Hyperlipidemia 08/12/2012  . Hypertension   . Hyponatremia 08/14/2012  . Neuropathy   . Pancreatitis   . Pancreatitis, acute 09/27/2012  . Peripheral vascular disease ([place])   . Vitamin D deficiency 06/23/2015    Current Outpatient Medications:  .  aspirin EC 81 MG tablet, Take 81 mg by mouth daily., Disp: , Rfl:  .  Blood Glucose Monitoring Suppl (ONETOUCH VERIO) w/Device KIT, 1 each by Does not apply route 2 (two) times daily. Use as instructed twice daily, check fasting sugar in morning and check before bedtime. E11.8, Disp: 1 kit, Rfl: 0 .  clopidogrel (PLAVIX) 75 MG tablet, TAKE 1 TABLET (75 MG TOTAL) BY MOUTH DAILY., Disp: 30 tablet, Rfl: 11 .  clopidogrel (PLAVIX) 75 MG tablet, TAKE 1 TABLET (75 MG TOTAL) BY MOUTH DAILY., Disp: 30 tablet, Rfl: 11 .  collagenase (SANTYL) ointment, Apply 1 application topically daily., Disp: 15 g, Rfl: 0 .  collagenase (SANTYL) ointment, Apply 1 application topically daily. Apply Santyl Ointment to scab on left 3rd and 4th digits. Measurements: Left 3rd toe 1.0 x 0.6 cm Left 4th toe: 2.0 x 1.0 cm, Disp: 30 g, Rfl: 0 .   collagenase (SANTYL) ointment, Apply 1 application topically daily., Disp: 90 each, Rfl: 0 .  Continuous Blood Gluc Receiver (FREESTYLE LIBRE 14 DAY READER) DEVI, 1 each by Does not apply route See admin instructions. Use as directed to check blood glucose, Disp: 1 each, Rfl: 0 .  Continuous Blood Gluc Sensor (FREESTYLE LIBRE 14 DAY SENSOR) MISC, INJECT IN THE SKIN EVERY 14 (FOURTEEN) DAYS. E11.69 Z79.4, Disp: 6 each, Rfl: 3 .  dapagliflozin propanediol (FARXIGA) 5 MG TABS tablet, Take 1 tablet (5 mg total) by mouth daily before breakfast., Disp: 90 tablet, Rfl: 1 .  gabapentin (NEURONTIN) 300 MG capsule, Take 1 capsule (300 mg total) by mouth 2 (two) times daily. (Patient taking differently: Take 300 mg by mouth 2 (two) times daily as needed (pain).), Disp: 180 capsule, Rfl: 1 .  glucose blood test strip, Use as instructed twice daily, check fasting sugar in morning and check before bedtime. E11.8, Disp: 100 each, Rfl: 12 .  insulin NPH-regular Human (NOVOLIN 70/30) (70-30) 100 UNIT/ML injection, Inject 12 Units into the skin 2 (two) times daily with a meal., Disp: 20 mL, Rfl: 6 .  Insulin Syringe-Needle U-100 (TRUEPLUS INSULIN SYRINGE) 31G X 5/16" 0.3 ML MISC, Use as instructed to inject insulin daily., Disp: 100 each, Rfl: 2 .  Insulin Syringe-Needle U-100 31G X 5/16" 0.5 ML MISC, USE AS INSTRUCTED TO INJECT INSULIN  DAILY., Disp: 100 each, Rfl: 2 .  lisinopril-hydrochlorothiazide (ZESTORETIC) 20-12.5 MG tablet, TAKE 2 TABLETS EVERY DAY, Disp: 180 tablet, Rfl: 0 .  metFORMIN (GLUCOPHAGE) 1000 MG tablet, Take 1 tablet (1,000 mg total) by mouth 2 (two) times daily with a meal., Disp: 360 tablet, Rfl: 1 .  metoprolol tartrate (LOPRESSOR) 100 MG tablet, TAKE 1 TABLET TWICE DAILY, Disp: 180 tablet, Rfl: 0 .  ondansetron (ZOFRAN) 4 MG tablet, Take 1 tablet (4 mg total) by mouth every 8 (eight) hours as needed for nausea or vomiting., Disp: 30 tablet, Rfl: 0 .  ONETOUCH DELICA LANCETS 66Y MISC, Use as  instructed twice daily, check fasting sugar in morning and check before bedtime. E11.8, Disp: 100 each, Rfl: 12 .  rosuvastatin (CRESTOR) 40 MG tablet, Take 1 tablet (40 mg total) by mouth daily. To lower cholesterol, Disp: 90 tablet, Rfl: 1 .  sildenafil (VIAGRA) 50 MG tablet, TAKE 2 TABLETS DAILY AS NEEDED FOR ERECTILE DYSFUNCTION, Disp: 18 tablet, Rfl: 1 .  silver sulfADIAZINE (SILVADENE) 1 % cream, Apply to affected toes once daily and cover with dressing., Disp: 400 g, Rfl: 1 .  traMADol (ULTRAM) 50 MG tablet, TAKE 1 TABLET (50 MG TOTAL) BY MOUTH EVERY 6 (SIX) HOURS AS NEEDED FOR MODERATE PAIN., Disp: 12 tablet, Rfl: 0  Current Facility-Administered Medications:  .  sodium chloride flush (NS) 0.9 % injection 3 mL, 3 mL, Intravenous, Q12H, Lorretta Harp, MD .  sodium chloride flush (NS) 0.9 % injection 3 mL, 3 mL, Intravenous, Q12H, Lorretta Harp, MD  Social History   Tobacco Use  Smoking Status Never Smoker  Smokeless Tobacco Never Used    Allergies  Allergen Reactions  . Percocet [Oxycodone-Acetaminophen] Nausea And Vomiting    10/325. Pt has 3 doses of this medication. N/V, temperature 97, no chills and slight sweating. Pt dressing was C/D/I no drainage noted.    Objective:  There were no vitals filed for this visit. There is no height or weight on file to calculate BMI. Constitutional Well developed. Well nourished.  Vascular Dorsalis pedis pulses palpable bilaterally. Posterior tibial pulses palpable bilaterally. Capillary refill normal to all digits.  No cyanosis or clubbing noted. Pedal hair growth normal.  Neurologic Normal speech. Oriented to person, place, and time. Epicritic sensation to light touch grossly present bilaterally.  Dermatologic  xerosis resolved  to the left heel without any sign of ulceration..  No purulent drainage noted.  Does not probe down to deep tissue.  No malodor present.  No cellulitis present  Orthopedic: Normal joint ROM without  pain or crepitus bilaterally. No visible deformities. No bony tenderness.   Radiographs: None Assessment:   No diagnosis found. Plan:  Patient was evaluated and treated and all questions answered.  Left heel severe dry skin with underlying fissure -Clinically healed.  Ulcer left heel with fat layer exposed -Completely epithelialized.  No signs of ulceration noted.  At this time he can return back to regular shoes/diabetic shoes.  If any foot and ankle issues arises come back and see me.  I discussed shoe gear modification with him as well as moisturization and offloading in extensive detail.  No follow-ups on file.     No follow-ups on file.

## 2020-07-22 ENCOUNTER — Ambulatory Visit: Payer: Medicare HMO | Attending: Family Medicine | Admitting: Family Medicine

## 2020-07-22 ENCOUNTER — Other Ambulatory Visit: Payer: Self-pay

## 2020-07-22 ENCOUNTER — Encounter: Payer: Self-pay | Admitting: Family Medicine

## 2020-07-22 VITALS — BP 117/72 | HR 76 | Ht 68.0 in | Wt 182.0 lb

## 2020-07-22 DIAGNOSIS — E11621 Type 2 diabetes mellitus with foot ulcer: Secondary | ICD-10-CM | POA: Diagnosis not present

## 2020-07-22 DIAGNOSIS — L97429 Non-pressure chronic ulcer of left heel and midfoot with unspecified severity: Secondary | ICD-10-CM | POA: Diagnosis not present

## 2020-07-22 DIAGNOSIS — E1169 Type 2 diabetes mellitus with other specified complication: Secondary | ICD-10-CM | POA: Insufficient documentation

## 2020-07-22 DIAGNOSIS — Z79899 Other long term (current) drug therapy: Secondary | ICD-10-CM | POA: Diagnosis not present

## 2020-07-22 DIAGNOSIS — E1151 Type 2 diabetes mellitus with diabetic peripheral angiopathy without gangrene: Secondary | ICD-10-CM | POA: Insufficient documentation

## 2020-07-22 DIAGNOSIS — I5042 Chronic combined systolic (congestive) and diastolic (congestive) heart failure: Secondary | ICD-10-CM | POA: Insufficient documentation

## 2020-07-22 DIAGNOSIS — E785 Hyperlipidemia, unspecified: Secondary | ICD-10-CM | POA: Diagnosis not present

## 2020-07-22 DIAGNOSIS — Z885 Allergy status to narcotic agent status: Secondary | ICD-10-CM | POA: Insufficient documentation

## 2020-07-22 DIAGNOSIS — Z8249 Family history of ischemic heart disease and other diseases of the circulatory system: Secondary | ICD-10-CM | POA: Diagnosis not present

## 2020-07-22 DIAGNOSIS — Z7982 Long term (current) use of aspirin: Secondary | ICD-10-CM | POA: Diagnosis not present

## 2020-07-22 DIAGNOSIS — Z7902 Long term (current) use of antithrombotics/antiplatelets: Secondary | ICD-10-CM | POA: Diagnosis not present

## 2020-07-22 DIAGNOSIS — E1159 Type 2 diabetes mellitus with other circulatory complications: Secondary | ICD-10-CM | POA: Diagnosis not present

## 2020-07-22 DIAGNOSIS — Z794 Long term (current) use of insulin: Secondary | ICD-10-CM | POA: Diagnosis not present

## 2020-07-22 DIAGNOSIS — Z89422 Acquired absence of other left toe(s): Secondary | ICD-10-CM | POA: Diagnosis not present

## 2020-07-22 DIAGNOSIS — I152 Hypertension secondary to endocrine disorders: Secondary | ICD-10-CM

## 2020-07-22 DIAGNOSIS — I110 Hypertensive heart disease with heart failure: Secondary | ICD-10-CM | POA: Diagnosis not present

## 2020-07-22 DIAGNOSIS — I739 Peripheral vascular disease, unspecified: Secondary | ICD-10-CM

## 2020-07-22 LAB — GLUCOSE, POCT (MANUAL RESULT ENTRY): POC Glucose: 194 mg/dl — AB (ref 70–99)

## 2020-07-22 LAB — POCT GLYCOSYLATED HEMOGLOBIN (HGB A1C): HbA1c, POC (controlled diabetic range): 7.6 % — AB (ref 0.0–7.0)

## 2020-07-22 MED ORDER — NOVOLIN 70/30 (70-30) 100 UNIT/ML ~~LOC~~ SUSP: 12.0000 [IU] | Freq: Two times a day (BID) | SUBCUTANEOUS | 6 refills | Status: DC

## 2020-07-22 MED ORDER — DAPAGLIFLOZIN PROPANEDIOL 10 MG PO TABS: 10.0000 mg | ORAL_TABLET | Freq: Every day | ORAL | 1 refills | Status: DC

## 2020-07-22 NOTE — Progress Notes (Signed)
Subjective:  Patient ID: Caleb [name], male    DOB: [birth year]  Age: 70 y.o. MRN: [id number]  CC: Diabetes   HPI Caleb [name] is a 70 -year-old male with a history of type 2 diabetes mellitus (A1c7.6), hypertension, hyperlipidemia,s/p L 4th and 5th toe metatarsal amputation,PAD status postb/lpopliteal artery intervention, bilateralcarotid artery stenosis status postbilateral transcarotid artery revascularization (in 11/2019 and 01/2020),CHF (EF 40 to 45% from echo of 08/2019, grade 1 DD, LVH, LV global hypokinesis, moderate to severe aortic valve stenosis)who presents today for a follow-up visit.  Seen by podiatry on 07/10/2020 for diabetic ulcer of left heel which has completely epithelialized.  With regards to his diabetes mellitus he is doing well and denies hypoglycemia, blurry vision and  numbness in extremities is minimal and controlled on Gabapentin. Had his eyes checked last week. Exercises by means of cycling and weight lifting. Highest random sugar - 230, lowest 274 He is compliant with his statin and his antihypertensive. Denies presence of claudication pain.  He saw Dr. Gwenlyn Found of cardiology for follow-up of his PAD 6 weeks ago and notes reveal a decline in his left ABI to 0.58 with an occluded left popliteal stent.  Plan is to monitor this. He has no chest pain, dyspnea, pedal edema, orthopnea reduced exercise tolerance. He will be undergoing a vasectomy soon in [place]. He has postponed relocation to [facility] for one year.  Past Medical History:  Diagnosis Date  . Aortic stenosis   . Carotid artery occlusion   . Diabetes mellitus without complication ([place])   . Dyslipidemia 09/27/2012  . Erectile dysfunction 09/27/2012  . Heart murmur   . Hypercholesteremia   . Hyperlipidemia 08/12/2012  . Hypertension   . Hyponatremia 08/14/2012  . Neuropathy   . Pancreatitis   . Pancreatitis, acute 09/27/2012  . Peripheral vascular disease ([place])   . Vitamin D deficiency 06/23/2015     Past Surgical History:  Procedure Laterality Date  . ABDOMINAL AORTOGRAM W/LOWER EXTREMITY N/A 08/08/2019   Procedure: ABDOMINAL AORTOGRAM W/LOWER EXTREMITY;  Surgeon: Lorretta Harp, MD;  Location: [place] [facility];  Service: Cardiovascular;  Laterality: N/A;  . ABDOMINAL AORTOGRAM W/LOWER EXTREMITY N/A 11/18/2019   Procedure: ABDOMINAL AORTOGRAM W/LOWER EXTREMITY;  Surgeon: Lorretta Harp, MD;  Location: [place] [facility];  Service: Cardiovascular;  Laterality: N/A;  . COLONOSCOPY  12 years ago   in [place] clinic= normal exam per pt  . KNEE SURGERY    . PERIPHERAL VASCULAR INTERVENTION Right 08/08/2019   Procedure: PERIPHERAL VASCULAR INTERVENTION;  Surgeon: Lorretta Harp, MD;  Location: [place] [facility];  Service: Cardiovascular;  Laterality: Right;  . PERIPHERAL VASCULAR INTERVENTION Left 11/18/2019   Procedure: PERIPHERAL VASCULAR INTERVENTION;  Surgeon: Lorretta Harp, MD;  Location: [place] [facility];  Service: Cardiovascular;  Laterality: Left;  left popliteal artery  . TRANSCAROTID ARTERY REVASCULARIZATION Left 12/16/2019   Procedure: TRANSCAROTID ARTERY REVASCULARIZATION;  Surgeon: Elam Dutch, MD;  Location: [place];  Service: Vascular;  Laterality: Left;  . TRANSCAROTID ARTERY REVASCULARIZATION Right 02/03/2020   Procedure: RIGHT TRANSCAROTID ARTERY REVASCULARIZATION;  Surgeon: Elam Dutch, MD;  Location: [place];  Service: Vascular;  Laterality: Right;  . ULTRASOUND GUIDANCE FOR VASCULAR ACCESS Right 12/16/2019   Procedure: ULTRASOUND GUIDANCE FOR VASCULAR ACCESS;  Surgeon: Elam Dutch, MD;  Location: [place];  Service: Vascular;  Laterality: Right;  . ULTRASOUND GUIDANCE FOR VASCULAR ACCESS Right 02/03/2020   Procedure: ULTRASOUND GUIDANCE FOR VASCULAR ACCESS;  Surgeon: Oneida Alar,  Jessy Oto, MD;  Location: [facility];  Service: Vascular;  Laterality: Right;    Family History  Problem Relation Age of Onset  . CAD Father   . Hypertension Father   .  Alcohol abuse Father        Cause of death  . Diabetes Mother   . Colon polyps Mother   . CAD Brother 46       CABG  . Colon cancer Neg Hx   . Esophageal cancer Neg Hx   . Rectal cancer Neg Hx   . Stomach cancer Neg Hx     Allergies  Allergen Reactions  . Percocet [Oxycodone-Acetaminophen] Nausea And Vomiting    10/325. Pt has 3 doses of this medication. N/V, temperature 97, no chills and slight sweating. Pt dressing was C/D/I no drainage noted.     Outpatient Medications Prior to Visit  Medication Sig Dispense Refill  . aspirin EC 81 MG tablet Take 81 mg by mouth daily.    . Blood Glucose Monitoring Suppl (ONETOUCH VERIO) w/Device KIT 1 each by Does not apply route 2 (two) times daily. Use as instructed twice daily, check fasting sugar in morning and check before bedtime. E11.8 1 kit 0  . clopidogrel (PLAVIX) 75 MG tablet TAKE 1 TABLET (75 MG TOTAL) BY MOUTH DAILY. 30 tablet 11  . clopidogrel (PLAVIX) 75 MG tablet TAKE 1 TABLET (75 MG TOTAL) BY MOUTH DAILY. 30 tablet 11  . collagenase (SANTYL) ointment Apply 1 application topically daily. 15 g 0  . collagenase (SANTYL) ointment Apply 1 application topically daily. Apply Santyl Ointment to scab on left 3rd and 4th digits. Measurements: Left 3rd toe 1.0 x 0.6 cm Left 4th toe: 2.0 x 1.0 cm 30 g 0  . collagenase (SANTYL) ointment Apply 1 application topically daily. 90 each 0  . Continuous Blood Gluc Receiver (FREESTYLE LIBRE 14 DAY READER) DEVI 1 each by Does not apply route See admin instructions. Use as directed to check blood glucose 1 each 0  . Continuous Blood Gluc Sensor (FREESTYLE LIBRE 14 DAY SENSOR) MISC INJECT IN THE SKIN EVERY 14 (FOURTEEN) DAYS. E11.69 Z79.4 6 each 3  . gabapentin (NEURONTIN) 300 MG capsule Take 1 capsule (300 mg total) by mouth 2 (two) times daily. (Patient taking differently: Take 300 mg by mouth 2 (two) times daily as needed (pain).) 180 capsule 1  . glucose blood test strip Use as instructed twice daily,  check fasting sugar in morning and check before bedtime. E11.8 100 each 12  . Insulin Syringe-Needle U-100 (TRUEPLUS INSULIN SYRINGE) 31G X 5/16" 0.3 ML MISC Use as instructed to inject insulin daily. 100 each 2  . Insulin Syringe-Needle U-100 31G X 5/16" 0.5 ML MISC USE AS INSTRUCTED TO INJECT INSULIN DAILY. 100 each 2  . lisinopril-hydrochlorothiazide (ZESTORETIC) 20-12.5 MG tablet TAKE 2 TABLETS EVERY DAY 180 tablet 0  . metFORMIN (GLUCOPHAGE) 1000 MG tablet Take 1 tablet (1,000 mg total) by mouth 2 (two) times daily with a meal. 360 tablet 1  . metoprolol tartrate (LOPRESSOR) 100 MG tablet TAKE 1 TABLET TWICE DAILY 180 tablet 0  . ondansetron (ZOFRAN) 4 MG tablet Take 1 tablet (4 mg total) by mouth every 8 (eight) hours as needed for nausea or vomiting. 30 tablet 0  . ONETOUCH DELICA LANCETS 79K MISC Use as instructed twice daily, check fasting sugar in morning and check before bedtime. E11.8 100 each 12  . rosuvastatin (CRESTOR) 40 MG tablet Take 1 tablet (40 mg total) by mouth  daily. To lower cholesterol 90 tablet 1  . sildenafil (VIAGRA) 50 MG tablet TAKE 2 TABLETS DAILY AS NEEDED FOR ERECTILE DYSFUNCTION 18 tablet 1  . silver sulfADIAZINE (SILVADENE) 1 % cream Apply to affected toes once daily and cover with dressing. 400 g 1  . traMADol (ULTRAM) 50 MG tablet TAKE 1 TABLET (50 MG TOTAL) BY MOUTH EVERY 6 (SIX) HOURS AS NEEDED FOR MODERATE PAIN. 12 tablet 0  . dapagliflozin propanediol (FARXIGA) 5 MG TABS tablet Take 1 tablet (5 mg total) by mouth daily before breakfast. 90 tablet 1  . insulin NPH-regular Human (NOVOLIN 70/30) (70-30) 100 UNIT/ML injection Inject 12 Units into the skin 2 (two) times daily with a meal. 20 mL 6   Facility-Administered Medications Prior to Visit  Medication Dose Route Frequency Provider Last Rate Last Admin  . sodium chloride flush (NS) 0.9 % injection 3 mL  3 mL Intravenous Q12H Lorretta Harp, MD      . sodium chloride flush (NS) 0.9 % injection 3 mL  3 mL  Intravenous Q12H Lorretta Harp, MD         ROS Review of Systems  Constitutional: Negative for activity change and appetite change.  HENT: Negative for sinus pressure and sore throat.   Eyes: Negative for visual disturbance.  Respiratory: Negative for cough, chest tightness and shortness of breath.   Cardiovascular: Negative for chest pain and leg swelling.  Gastrointestinal: Negative for abdominal distention, abdominal pain, constipation and diarrhea.  Endocrine: Negative.   Genitourinary: Negative for dysuria.  Musculoskeletal: Negative for joint swelling and myalgias.  Skin: Negative for rash.  Allergic/Immunologic: Negative.   Neurological: Negative for weakness, light-headedness and numbness.  Psychiatric/Behavioral: Negative for dysphoric mood and suicidal ideas.    Objective:  BP 117/72   Pulse 76   Ht '5\' 8"'  (1.727 m)   Wt 182 lb (82.6 kg)   SpO2 99%   BMI 27.67 kg/m   BP/Weight 07/22/2020 3/78/5885 0/04/7739  Systolic BP 287 867 672  Diastolic BP 72 094 709  Wt. (Lbs) 182 176.6 183  BMI 27.67 26.85 28.66      Physical Exam Constitutional:      Appearance: He is well-developed.  Neck:     Vascular: No JVD.  Cardiovascular:     Rate and Rhythm: Normal rate.     Pulses:          Dorsalis pedis pulses are 1+ on the left side.     Heart sounds: Murmur heard.    Pulmonary:     Effort: Pulmonary effort is normal.     Breath sounds: Normal breath sounds. No wheezing or rales.  Chest:     Chest wall: No tenderness.  Abdominal:     General: Bowel sounds are normal. There is no distension.     Palpations: Abdomen is soft. There is no mass.     Tenderness: There is no abdominal tenderness.  Musculoskeletal:        General: Normal range of motion.     Right lower leg: No edema.     Left lower leg: No edema.  Neurological:     Mental Status: He is alert and oriented to person, place, and time.  Psychiatric:        Mood and Affect: Mood normal.     Diabetic Foot Exam - Simple   Simple Foot Form Visual Inspection See comments: Yes Sensation Testing See comments: Yes Pulse Check Comments Amputation of left fourth and fifth toes.  Hammertoes x10 bilaterally.  No ulcerations Reduced monofilament testing bilaterally Diminished left dorsalis pedis.  Right is normal     CMP Latest Ref Rng & Units 06/05/2020 05/27/2020 02/04/2020  Glucose 70 - 99 mg/dL - 173(H) 179(H)  BUN 8 - 23 mg/dL - 31(H) 32(H)  Creatinine 0.61 - 1.24 mg/dL - 1.47(H) 1.43(H)  Sodium 135 - 145 mmol/L - 139 135  Potassium 3.5 - 5.1 mmol/L - 4.3 4.4  Chloride 98 - 111 mmol/L - 102 102  CO2 22 - 32 mmol/L - 23 23  Calcium 8.9 - 10.3 mg/dL - 10.0 8.3(L)  Total Protein 6.0 - 8.5 g/dL 7.5 - -  Total Bilirubin 0.0 - 1.2 mg/dL 0.2 - -  Alkaline Phos 44 - 121 IU/L 130(H) - -  AST 0 - 40 IU/L 26 - -  ALT 0 - 44 IU/L 24 - -    Lipid Panel     Component Value Date/Time   CHOL 106 06/05/2020 0947   TRIG 138 06/05/2020 0947   HDL 53 06/05/2020 0947   CHOLHDL 2.0 06/05/2020 0947   CHOLHDL 3.9 04/02/2013 1632   VLDL 66 (H) 04/02/2013 1632   LDLCALC 29 06/05/2020 0947    CBC    Component Value Date/Time   WBC 6.7 05/27/2020 1546   RBC 5.51 05/27/2020 1546   HGB 15.3 05/27/2020 1546   HGB 13.3 11/15/2019 1139   HCT 48.2 05/27/2020 1546   HCT 40.2 11/15/2019 1139   PLT 298 05/27/2020 1546   PLT 300 11/15/2019 1139   MCV 87.5 05/27/2020 1546   MCV 86 11/15/2019 1139   MCH 27.8 05/27/2020 1546   MCHC 31.7 05/27/2020 1546   RDW 13.6 05/27/2020 1546   RDW 14.5 11/15/2019 1139   LYMPHSABS 3.3 (H) 09/28/2017 1113   MONOABS 0.5 06/09/2015 1705   EOSABS 0.3 09/28/2017 1113   BASOSABS 0.0 09/28/2017 1113    Lab Results  Component Value Date   HGBA1C 7.6 (A) 07/22/2020    Assessment & Plan:  1. Type 2 diabetes mellitus with other specified complication, with long-term current use of insulin (HCC) Suboptimally controlled with A1c of 7.6; goal is less  than 7.0 Increased dose of Farxiga If he notices hypoglycemia plan is to decrease Novolin 70/30 from 12 units to 10 units twice daily Counseled on Diabetic diet, my plate method, 702 minutes of moderate intensity exercise/week Blood sugar logs with fasting goals of 80-120 mg/dl, random of less than 180 and in the event of sugars less than 60 mg/dl or greater than 400 mg/dl encouraged to notify the clinic. Advised on the need for annual eye exams, annual foot exams, Pneumonia vaccine. - POCT glucose (manual entry) - POCT glycosylated hemoglobin (Hb A1C) - insulin NPH-regular Human (NOVOLIN 70/30) (70-30) 100 UNIT/ML injection; Inject 12 Units into the skin 2 (two) times daily with a meal.  Dispense: 20 mL; Refill: 6 - dapagliflozin propanediol (FARXIGA) 10 MG TABS tablet; Take 1 tablet (10 mg total) by mouth daily before breakfast.  Dispense: 90 tablet; Refill: 1  2. Status post amputation of lesser toe of left foot (HCC) Stable Discussed foot care  3. Hypertensive heart disease with chronic combined systolic and diastolic congestive heart failure (HCC) EF of 40 to 45%, mildly decreased LV function, global hypokinesis of LV, mild concentric LVH, moderate to severe aortic valve stenosis from echo of 08/2019 He is euvolemic Continue medications including Farxiga, ACE inhibitor  4. Hypertension complicating diabetes ([place]) Controlled Continue antihypertensive Counseled on blood  pressure goal of less than 130/80, low-sodium, DASH diet, medication compliance, 150 minutes of moderate intensity exercise per week. Discussed medication compliance, adverse effects.  5. Dyslipidemia associated with type 2 diabetes mellitus (HCC) Controlled Low-cholesterol diet  6. PVD Status post bilateral popliteal artery intervention Risk factor modification Continue Plavix     Meds ordered this encounter  Medications  . insulin NPH-regular Human (NOVOLIN 70/30) (70-30) 100 UNIT/ML injection    Sig:  Inject 12 Units into the skin 2 (two) times daily with a meal.    Dispense:  20 mL    Refill:  6  . dapagliflozin propanediol (FARXIGA) 10 MG TABS tablet    Sig: Take 1 tablet (10 mg total) by mouth daily before breakfast.    Dispense:  90 tablet    Refill:  1    Dose increase    Follow-up: Return in about 6 months (around 01/22/2021) for Chronic disease management.       [name], MD, FAAFP. [facility] and [place], [place]   07/22/2020, 2:29 PM

## 2020-07-22 NOTE — Patient Instructions (Signed)
Diabetes Mellitus and Foot Care Foot care is an important part of your health, especially when you have diabetes. Diabetes may cause you to have problems because of poor blood flow (circulation) to your feet and legs, which can cause your skin to:  Become thinner and drier.  Break more easily.  Heal more slowly.  Peel and crack. You may also have nerve damage (neuropathy) in your legs and feet, causing decreased feeling in them. This means that you may not notice minor injuries to your feet that could lead to more serious problems. Noticing and addressing any potential problems early is the best way to prevent future foot problems. How to care for your feet Foot hygiene  Wash your feet daily with warm water and mild soap. Do not use hot water. Then, pat your feet and the areas between your toes until they are completely dry. Do not soak your feet as this can dry your skin.  Trim your toenails straight across. Do not dig under them or around the cuticle. File the edges of your nails with an emery board or nail file.  Apply a moisturizing lotion or petroleum jelly to the skin on your feet and to dry, brittle toenails. Use lotion that does not contain alcohol and is unscented. Do not apply lotion between your toes.   Shoes and socks  Wear clean socks or stockings every day. Make sure they are not too tight. Do not wear knee-high stockings since they may decrease blood flow to your legs.  Wear shoes that fit properly and have enough cushioning. Always look in your shoes before you put them on to be sure there are no objects inside.  To break in new shoes, wear them for just a few hours a day. This prevents injuries on your feet. Wounds, scrapes, corns, and calluses  Check your feet daily for blisters, cuts, bruises, sores, and redness. If you cannot see the bottom of your feet, use a mirror or ask someone for help.  Do not cut corns or calluses or try to remove them with medicine.  If you  find a minor scrape, cut, or break in the skin on your feet, keep it and the skin around it clean and dry. You may clean these areas with mild soap and water. Do not clean the area with peroxide, alcohol, or iodine.  If you have a wound, scrape, corn, or callus on your foot, look at it several times a day to make sure it is healing and not infected. Check for: ? Redness, swelling, or pain. ? Fluid or blood. ? Warmth. ? Pus or a bad smell.   General tips  Do not cross your legs. This may decrease blood flow to your feet.  Do not use heating pads or hot water bottles on your feet. They may burn your skin. If you have lost feeling in your feet or legs, you may not know this is happening until it is too late.  Protect your feet from hot and cold by wearing shoes, such as at the beach or on hot pavement.  Schedule a complete foot exam at least once a year (annually) or more often if you have foot problems. Report any cuts, sores, or bruises to your health care provider immediately. Where to find more information  American Diabetes Association: www.diabetes.org  Association of Diabetes Care & Education Specialists: www.diabeteseducator.org Contact a health care provider if:  You have a medical condition that increases your risk of infection and   you have any cuts, sores, or bruises on your feet.  You have an injury that is not healing.  You have redness on your legs or feet.  You feel burning or tingling in your legs or feet.  You have pain or cramps in your legs and feet.  Your legs or feet are numb.  Your feet always feel cold.  You have pain around any toenails. Get help right away if:  You have a wound, scrape, corn, or callus on your foot and: ? You have pain, swelling, or redness that gets worse. ? You have fluid or blood coming from the wound, scrape, corn, or callus. ? Your wound, scrape, corn, or callus feels warm to the touch. ? You have pus or a bad smell coming from  the wound, scrape, corn, or callus. ? You have a fever. ? You have a red line going up your leg. Summary  Check your feet every day for blisters, cuts, bruises, sores, and redness.  Apply a moisturizing lotion or petroleum jelly to the skin on your feet and to dry, brittle toenails.  Wear shoes that fit properly and have enough cushioning.  If you have foot problems, report any cuts, sores, or bruises to your health care provider immediately.  Schedule a complete foot exam at least once a year (annually) or more often if you have foot problems. This information is not intended to replace advice given to you by your health care provider. Make sure you discuss any questions you have with your health care provider. Document Revised: 09/26/2019 Document Reviewed: 09/26/2019 Elsevier Patient Education  2021 Elsevier Inc.  

## 2020-08-14 ENCOUNTER — Other Ambulatory Visit: Payer: Self-pay | Admitting: Family Medicine

## 2020-08-14 DIAGNOSIS — N529 Male erectile dysfunction, unspecified: Secondary | ICD-10-CM

## 2020-08-26 ENCOUNTER — Other Ambulatory Visit (HOSPITAL_COMMUNITY): Payer: Medicare Other

## 2020-08-26 ENCOUNTER — Other Ambulatory Visit: Payer: Self-pay

## 2020-08-27 ENCOUNTER — Other Ambulatory Visit: Payer: Self-pay

## 2020-08-27 NOTE — Patient Outreach (Signed)
[facility]) Care Management  08/27/2020  Caleb [name] 02-10-1951 [id number]   Telephone Screen  Referral Date: 08/26/2020 Referral Source: Join [facility] EMMI Campaign Referral Reason: "DM, Score 11" Insurance: [employer] Medicare Initial Assessment  Outreach attempt # 1 to patient. Successful outreach to patient.    Social: Patient resides in the home alone. He voices that he is moving to a new apartment on tomorrow. They are independent with ADLs/IADLs. Denies any recent falls. He remains very active and goes to gym about 3-4x/wk.   Conditions:Per chart review, patient has PMH that includes but not limited to [facility], neuropathy and PAD   Medications:Med review completed with patient. Patient denies any issues managing and/or affording meds at this time.  Medications Reviewed Today     Reviewed by Hayden Pedro, RN (Registered Nurse) on 08/27/20 at 1001  Med List Status: <None>   Medication Order Taking? Sig Documenting Provider Last Dose Status Informant  aspirin EC 81 MG tablet [id number]  Take 81 mg by mouth daily. [provider]  Active Self  Blood Glucose Monitoring Suppl (ONETOUCH VERIO) w/Device KIT [id number] No 1 each by Does not apply route 2 (two) times daily. Use as instructed twice daily, check fasting sugar in morning and check before bedtime. E11.8  Patient not taking: Reported on 08/27/2020   [name], MD Not Taking Active Self  clopidogrel (PLAVIX) 75 MG tablet [id number]  TAKE 1 TABLET (75 MG TOTAL) BY MOUTH DAILY. [name], MD  Active   collagenase (SANTYL) ointment [id number]  Apply 1 application topically daily. Felipa Furnace, DPM  Active   collagenase Annitta Needs) ointment [id number]  Apply 1 application topically daily. Apply Santyl Ointment to scab on left 3rd and 4th digits. Measurements: Left 3rd toe 1.0 x 0.6 cm Left 4th toe: 2.0 x 1.0 cm Felipa Furnace, DPM  Active   collagenase (SANTYL) ointment [id number]   Apply 1 application topically daily. Felipa Furnace, DPM  Active   Continuous Blood Gluc Receiver (FREESTYLE LIBRE 14 DAY READER) DEVI [id number]  1 each by Does not apply route See admin instructions. Use as directed to check blood glucose [name], MD  Active Self  Continuous Blood Gluc Sensor (FREESTYLE LIBRE 14 DAY SENSOR) MISC [id number]  INJECT IN THE SKIN EVERY 14 (FOURTEEN) DAYS. E11.69 Z79.4 [name], MD  Active Self  dapagliflozin propanediol (FARXIGA) 10 MG TABS tablet [id number]  Take 1 tablet (10 mg total) by mouth daily before breakfast. [name], MD  Active   gabapentin (NEURONTIN) 300 MG capsule [id number]  Take 1 capsule (300 mg total) by mouth 2 (two) times daily.  Patient taking differently: Take 300 mg by mouth 2 (two) times daily as needed (pain).   [name], MD  Active   glucose blood test strip [id number]  Use as instructed twice daily, check fasting sugar in morning and check before bedtime. E11.8 [name], MD  Active Self  insulin NPH-regular Human (NOVOLIN 70/30) (70-30) 100 UNIT/ML injection [id number]  Inject 12 Units into the skin 2 (two) times daily with a meal. Newlin, Enobong, MD  Active   Insulin Syringe-Needle U-100 (TRUEPLUS INSULIN SYRINGE) 31G X 5/16" 0.3 ML MISC [id number]  Use as instructed to inject insulin daily. [name], MD  Active Self  Insulin Syringe-Needle U-100 31G X 5/16" 0.5 ML MISC [id number]  USE AS INSTRUCTED TO INJECT INSULIN DAILY. [name], MD  Active   lisinopril-hydrochlorothiazide (ZESTORETIC) 20-12.5 MG tablet [id number] No TAKE 2 TABLETS EVERY  DAY  Patient not taking: Reported on 08/27/2020   [name], MD Not Taking Active   metFORMIN (GLUCOPHAGE) 1000 MG tablet [id number]  Take 1 tablet (1,000 mg total) by mouth 2 (two) times daily with a meal.  Patient taking differently: Take 1,000 mg by mouth 2 (two) times daily with a meal. Taking I/2 tab(515m) BID   NCharlott Rakes MD  Active Self   metoprolol tartrate (LOPRESSOR) 100 MG tablet [id number] TAKE 1 TABLET TWICE DAILY Newlin, Enobong, MD  Active   ondansetron (ZOFRAN) 4 MG tablet [id number] Take 1 tablet (4 mg total) by mouth every 8 (eight) hours as needed for nausea or vomiting. EEdrick Kins DPM  Active Self  [facility] LANC[facility] 356CMISC [id number] Use as instructed twice daily, check fasting sugar in morning and check before bedtime. E11.8  Patient not taking: Reported on 08/27/2020   NCharlott Rakes MD Not Taking Active Self  rosuvastatin (CRESTOR) 40 MG tablet [id number] Take 1 tablet (40 mg total) by mouth daily. To lower cholesterol NCharlott Rakes MD  Active   sildenafil (VIAGRA) 50 MG tablet [id number] TAKE 2 TABLETS DAILY AS NEEDED FOR ERECTILE DYSFUNCTION NCharlott Rakes MD  Active   silver sulfADIAZINE (SILVADENE) 1 % cream [id number] Apply to affected toes once daily and cover with dressing. GMarzetta Board DPM  Active Self  sodium chloride flush (NS) 0.9 % injection 3 mL [id number]  BLorretta Harp MD  Active   sodium chloride flush (NS) 0.9 % injection 3 mL [id number]  BLorretta Harp MD  Active             [facility] 08/27/2020 07/22/2020 02/26/2020 04/16/2019 09/28/2017  Decreased Interest 0 0 0 0 0  Down, Depressed, Hopeless 0 0 0 0 0  PHQ - 2 Score 0 0 0 0 0  Altered sleeping - 0 0 0 0  Tired, decreased energy - 0 0 0 1  Change in appetite - 0 0 0 0  Feeling bad or failure about yourself  - 0 0 0 0  Trouble concentrating - 0 0 0 0  Moving slowly or fidgety/restless - 0 0 0 0  Suicidal thoughts - 0 0 0 0  PHQ-9 Score - 0 0 0 1    Fall Risk  08/27/2020 07/22/2020 02/26/2020 12/31/2019 07/18/2019  Falls in the past year? 0 0 0 0 0  Comment - - - - -  Number falls in past yr: 0 0 0 - -  Injury with Fall? 0 0 0 - -  Risk for fall due to : Medication side effect - - - -  Follow up Education provided;Falls evaluation completed - - - -    SDOH Screenings   Alcohol Screen:  Not on file  Depression (PHQ2-9): Low Risk    PHQ-2 Score: 0  Financial Resource Strain: Not on file  Food Insecurity: No Food Insecurity   Worried About RCharity fundraiserin the Last Year: Never true   Ran Out of Food in the Last Year: Never true  Housing: Not on file  Physical Activity: Not on file  Social Connections: Not on file  Stress: Not on file  Tobacco Use: Low Risk    Smoking Tobacco Use: Never   Smokeless Tobacco Use: Never  Transportation Needs: No Transportation Needs   Lack of Transportation (Medical): No   Lack of Transportation (Non-Medical): No       Consent:THN  services reviewed and discussed with patient. Verbal consent for services given.   Goals Addressed               This Visit's Progress     (THN)Set My Target A1C-Diabetes Type 2 (pt-stated)        Timeframe:  Long-Range Goal Priority:  High Start Date:  08/27/2020                           Expected End Date:  03/19/2021                     Follow Up Date July 2022   - set target A1C-lowering of A1C level from 7.6    Why is this important?   Your target A1C is decided together by you and your doctor.  It is based on several things like your age and other health issues.    Notes:  08/27/20 Patient has freestyle Libre meter. Blood sugar this morning was 90-fasting. He reports cbgs normally range in the 70s to low 100s. Last A1C on file 7.69(May 2022).          Plan:  RN CM discussed with patient next outreach within the month of July. Patient gave verbal consent and in agreement with RN CM follow up and timeframe. Patient aware that they may contact RN CM sooner for any issues or concerns. RN CM reviewed goals and plan of care with patient. Patient agrees to care plan and follow up. RN CM will send barriers letter and route encounter to PCP. RN CM will send welcome letter to patient.  Enzo Montgomery, RN,BSN,CCM [place] Management Telephonic Care Management Coordinator Direct  Phone: [phone] Toll Free: [phone] Fax: [phone]

## 2020-09-01 ENCOUNTER — Ambulatory Visit (HOSPITAL_COMMUNITY): Payer: Medicare HMO | Attending: Internal Medicine

## 2020-09-01 ENCOUNTER — Encounter (HOSPITAL_COMMUNITY): Payer: Self-pay | Admitting: Cardiovascular Disease

## 2020-09-07 DIAGNOSIS — E119 Type 2 diabetes mellitus without complications: Secondary | ICD-10-CM | POA: Diagnosis not present

## 2020-09-11 ENCOUNTER — Other Ambulatory Visit: Payer: Self-pay

## 2020-09-11 ENCOUNTER — Ambulatory Visit (INDEPENDENT_AMBULATORY_CARE_PROVIDER_SITE_OTHER): Payer: Medicare HMO | Admitting: Podiatry

## 2020-09-11 ENCOUNTER — Telehealth (HOSPITAL_COMMUNITY): Payer: Self-pay | Admitting: Cardiovascular Disease

## 2020-09-11 ENCOUNTER — Encounter: Payer: Self-pay | Admitting: Podiatry

## 2020-09-11 DIAGNOSIS — L84 Corns and callosities: Secondary | ICD-10-CM | POA: Diagnosis not present

## 2020-09-11 DIAGNOSIS — E1151 Type 2 diabetes mellitus with diabetic peripheral angiopathy without gangrene: Secondary | ICD-10-CM

## 2020-09-11 DIAGNOSIS — M545 Low back pain, unspecified: Secondary | ICD-10-CM | POA: Insufficient documentation

## 2020-09-11 DIAGNOSIS — Z89422 Acquired absence of other left toe(s): Secondary | ICD-10-CM

## 2020-09-11 DIAGNOSIS — R2681 Unsteadiness on feet: Secondary | ICD-10-CM | POA: Insufficient documentation

## 2020-09-11 DIAGNOSIS — E11621 Type 2 diabetes mellitus with foot ulcer: Secondary | ICD-10-CM | POA: Diagnosis not present

## 2020-09-11 DIAGNOSIS — L97422 Non-pressure chronic ulcer of left heel and midfoot with fat layer exposed: Secondary | ICD-10-CM | POA: Diagnosis not present

## 2020-09-11 DIAGNOSIS — B351 Tinea unguium: Secondary | ICD-10-CM

## 2020-09-11 DIAGNOSIS — N19 Unspecified kidney failure: Secondary | ICD-10-CM | POA: Insufficient documentation

## 2020-09-11 DIAGNOSIS — E1140 Type 2 diabetes mellitus with diabetic neuropathy, unspecified: Secondary | ICD-10-CM | POA: Insufficient documentation

## 2020-09-11 MED FILL — Insulin Syringe/Needle U-100 1/2 ML 31 x 5/16": 50 days supply | Qty: 100 | Fill #0 | Status: CN

## 2020-09-11 MED FILL — Insulin Syringe/Needle U-100 0.3 ML 30 x 5/16": 30 days supply | Qty: 100 | Fill #0 | Status: AC

## 2020-09-11 NOTE — Telephone Encounter (Signed)
Just an FYI. We have made several attempts to contact this patient including sending a letter to schedule or reschedule their echocardiogram. We will be removing the patient from the echo WQ.  09/01/20 NO SHOWED-MAILED LETTER LBW      Thank you

## 2020-09-13 NOTE — Progress Notes (Signed)
  Subjective:  Patient ID: Caleb [name], male    DOB: [birth year],  MRN: [id number]  70 y.o. male presents with at risk foot care. Patient has h/o amputation of digital amputation L 4th toe and L 5th toe and thick, elongated toenails 1-5 right, L hallux, L 2nd toe, and L 3rd toe which are tender when wearing enclosed shoe gear..    Patient's blood sugar was 115 mg/dl this morning and 137 mg/dl after lunch..  PCP: [name], MD and last visit was: 07/22/2020.  He voices no new pedal problems on today's visit.  Review of Systems: Negative except as noted in the HPI.   Allergies  Allergen Reactions   Percocet [Oxycodone-Acetaminophen] Nausea And Vomiting    10/325. Pt has 3 doses of this medication. N/V, temperature 97, no chills and slight sweating. Pt dressing was C/D/I no drainage noted.     Objective:  There were no vitals filed for this visit. Constitutional Patient is a pleasant 70 y.o. African American male WD, WN in NAD. AAO x 3.  Vascular Capillary refill time to remaining digits <3 seconds. Nonpalpable DP pulse(s) b/l lower extremities. Nonpalpable PT pulse(s) b/l lower extremities. Pedal hair absent. Lower extremity skin temperature gradient within normal limits. No pain with calf compression b/l. No edema noted b/l lower extremities. No cyanosis or clubbing noted.  Neurologic Normal speech. Protective sensation intact 5/5 intact bilaterally with 10g monofilament b/l. Vibratory sensation intact b/l.  Dermatologic Pedal skin with normal turgor, texture and tone b/l lower extremities No open wounds b/l lower extremities No interdigital macerations b/l lower extremities Toenails 1-5 right, L hallux, and L 3rd toe well maintained with adequate length. No erythema, no edema, no drainage, no fluctuance. Anonychia noted L 2nd toe. Nailbed(s) epithelialized.  Hyperkeratotic lesion(s) nailbed left 2nd toe.  No erythema, no edema, no drainage, no fluctuance.  Orthopedic: Normal muscle  strength 5/5 to all lower extremity muscle groups bilaterally. No pain crepitus or joint limitation noted with ROM b/l. Lower extremity amputation(s): digital amputation L 4th toe and L 5th toe.   Hemoglobin A1C Latest Ref Rng & Units 07/22/2020 02/03/2020 11/18/2019  HGBA1C 0.0 - 7.0 % 7.6(A) 7.7(H) 7.6(H)  Some recent data might be hidden    Assessment:   1. Onychomycosis   2. Callus   3. Status post amputation of lesser toe of left foot ([place])   4. Type II diabetes mellitus with peripheral circulatory disorder [facility])    Plan:  Patient was evaluated and treated and all questions answered.  Onychomycosis with pain -Nails palliatively debridement as below. -Educated on self-care  Procedure: Nail Debridement Rationale: Pain Type of Debridement: manual, sharp debridement. Instrumentation: Nail nipper, rotary burr. Number of Nails: 7  -Examined patient. -Continue diabetic foot care principles. -Patient to continue soft, supportive shoe gear daily. -Toenails 1-5 right, L hallux, and L 3rd toe debrided in length and girth without iatrogenic bleeding with sterile nail nipper and dremel.  -Callus(es) L 2nd toe pared utilizing sterile scalpel blade without complication or incident. Total number debrided =1. -Patient to report any pedal injuries to medical professional immediately. -Patient/POA to call should there be question/concern in the interim.  Return in about 3 months (around 12/12/2020).  Marzetta Board, DPM

## 2020-09-29 ENCOUNTER — Other Ambulatory Visit: Payer: Self-pay | Admitting: Family Medicine

## 2020-09-29 DIAGNOSIS — N529 Male erectile dysfunction, unspecified: Secondary | ICD-10-CM

## 2020-09-29 DIAGNOSIS — I10 Essential (primary) hypertension: Secondary | ICD-10-CM

## 2020-09-29 DIAGNOSIS — E1169 Type 2 diabetes mellitus with other specified complication: Secondary | ICD-10-CM

## 2020-09-29 DIAGNOSIS — E7800 Pure hypercholesterolemia, unspecified: Secondary | ICD-10-CM

## 2020-09-29 NOTE — Telephone Encounter (Signed)
Notes to clinic:  Medication was d/c 09/11/2020  Review for refill    Requested Prescriptions  Pending Prescriptions Disp Refills   metoprolol tartrate (LOPRESSOR) 100 MG tablet [Pharmacy Med Name: METOPROLOL TARTRATE 100 MG Tablet] 180 tablet 0    Sig: TAKE 1 TABLET TWICE DAILY      Cardiovascular:  Beta Blockers Passed - 09/29/2020  3:07 PM      Passed - Last BP in normal range    BP Readings from Last 1 Encounters:  07/22/20 117/72          Passed - Last Heart Rate in normal range    Pulse Readings from Last 1 Encounters:  07/22/20 76          Passed - Valid encounter within last 6 months    Recent Outpatient Visits           2 months ago Type 2 diabetes mellitus with other specified complication, with long-term current use of insulin ([place])   [place], [place], MD   7 months ago Contusion of face, initial encounter   [place], [place], MD   9 months ago Type 2 diabetes mellitus with other specified complication, with long-term current use of insulin ([place])   [place], [place], MD   1 year ago Type 2 diabetes mellitus with other specified complication, with long-term current use of insulin ([place])   [place], Charlane Ferretti, MD   1 year ago Type 2 diabetes mellitus with hypoglycemia without coma, with long-term current use of insulin ([place])   [place], Charlane Ferretti, MD       Future Appointments             In 2 weeks [name], MD [place]   In 2 months Gwenlyn Found, Pearletha Forge, MD [facility]               rosuvastatin (CRESTOR) 40 MG tablet [Pharmacy Med Name: ROSUVASTATIN CALCIUM 40 MG Tablet] 90 tablet 0    Sig: TAKE 1 TABLET EVERY DAY TO LOWER CHOLESTEROL      Cardiovascular:  Antilipid - Statins Passed - 09/29/2020  3:07  PM      Passed - Total Cholesterol in normal range and within 360 days    Cholesterol, Total  Date Value Ref Range Status  06/05/2020 106 100 - 199 mg/dL Final          Passed - LDL in normal range and within 360 days    LDL Chol Calc (NIH)  Date Value Ref Range Status  06/05/2020 29 0 - 99 mg/dL Final          Passed - HDL in normal range and within 360 days    HDL  Date Value Ref Range Status  06/05/2020 53 >39 mg/dL Final          Passed - Triglycerides in normal range and within 360 days    Triglycerides  Date Value Ref Range Status  06/05/2020 138 0 - 149 mg/dL Final          Passed - Patient is not pregnant      Passed - Valid encounter within last 12 months    Recent Outpatient Visits           2 months ago Type 2 diabetes mellitus with other specified complication,  with long-term current use of insulin ([place])   [place], Enobong, MD   7 months ago Contusion of face, initial encounter   [place], [place], MD   9 months ago Type 2 diabetes mellitus with other specified complication, with long-term current use of insulin ([place])   [place], [place], MD   1 year ago Type 2 diabetes mellitus with other specified complication, with long-term current use of insulin ([place])   [place], [place], MD   1 year ago Type 2 diabetes mellitus with hypoglycemia without coma, with long-term current use of insulin ([place])   [place], Enobong, MD       Future Appointments             In 2 weeks [name], MD [place]   In 2 months Gwenlyn Found, Pearletha Forge, MD [facility]              Signed Prescriptions Disp Refills   sildenafil (VIAGRA) 50 MG tablet 18 tablet 0    Sig: TAKE 2 TABLETS DAILY AS NEEDED FOR ERECTILE  DYSFUNCTION      Urology: Erectile Dysfunction Agents Passed - 09/29/2020  3:07 PM      Passed - Last BP in normal range    BP Readings from Last 1 Encounters:  07/22/20 117/72          Passed - Valid encounter within last 12 months    Recent Outpatient Visits           2 months ago Type 2 diabetes mellitus with other specified complication, with long-term current use of insulin ([place])   [place], Charlane Ferretti, MD   7 months ago Contusion of face, initial encounter   [place], [place], MD   9 months ago Type 2 diabetes mellitus with other specified complication, with long-term current use of insulin ([place])   [place], [place], MD   1 year ago Type 2 diabetes mellitus with other specified complication, with long-term current use of insulin ([place])   [place], [place], MD   1 year ago Type 2 diabetes mellitus with hypoglycemia without coma, with long-term current use of insulin [facility])   [place], Enobong, MD       Future Appointments             In 2 weeks [name], MD [place]   In 2 months Gwenlyn Found, Pearletha Forge, MD [facility]               gabapentin (NEURONTIN) 300 MG capsule 180 capsule 0    Sig: TAKE 1 CAPSULE TWICE DAILY      Neurology: Anticonvulsants - gabapentin Passed - 09/29/2020  3:07 PM      Passed - Valid encounter within last 12 months    Recent Outpatient Visits           2 months ago Type 2 diabetes mellitus with other specified complication, with long-term current use of insulin ([place])   [place], Enobong, MD   7 months ago Contusion of face, initial encounter   [place]  [name], MD   9 months ago Type 2 diabetes mellitus with  other specified complication, with long-term current use of insulin ([place])   [place], [place], MD   1 year ago Type 2 diabetes mellitus with other specified complication, with long-term current use of insulin ([place])   [place], [place], MD   1 year ago Type 2 diabetes mellitus with hypoglycemia without coma, with long-term current use of insulin [facility])   [place], Enobong, MD       Future Appointments             In 2 weeks [name], MD [place]   In 2 months Gwenlyn Found, Pearletha Forge, MD [place], [facility]

## 2020-10-05 ENCOUNTER — Other Ambulatory Visit: Payer: Self-pay

## 2020-10-05 NOTE — Patient Outreach (Signed)
[facility] [facility]) Care Management  10/05/2020  Caleb [name] 04/19/1950 [id number]   Telephone Assessment   Outreach attempt # 1 to patient. No answer. RN CM left HIPAA compliant voicemail message along with contact info.      Plan: RN CM will make outreach attempt to patient within the month of Aug if no return call from patient.  Enzo Montgomery, RN,BSN,CCM [place] Management Telephonic Care Management Coordinator Direct Phone: [phone] Toll Free: [phone] Fax: [phone]

## 2020-10-19 ENCOUNTER — Other Ambulatory Visit: Payer: Self-pay

## 2020-10-19 ENCOUNTER — Ambulatory Visit: Payer: Medicare HMO | Attending: Family Medicine | Admitting: Family Medicine

## 2020-10-19 ENCOUNTER — Encounter: Payer: Self-pay | Admitting: Family Medicine

## 2020-10-19 VITALS — BP 149/77 | HR 74 | Ht 68.0 in | Wt 179.6 lb

## 2020-10-19 DIAGNOSIS — E785 Hyperlipidemia, unspecified: Secondary | ICD-10-CM

## 2020-10-19 DIAGNOSIS — E11649 Type 2 diabetes mellitus with hypoglycemia without coma: Secondary | ICD-10-CM | POA: Diagnosis not present

## 2020-10-19 DIAGNOSIS — I10 Essential (primary) hypertension: Secondary | ICD-10-CM

## 2020-10-19 DIAGNOSIS — E1151 Type 2 diabetes mellitus with diabetic peripheral angiopathy without gangrene: Secondary | ICD-10-CM | POA: Diagnosis not present

## 2020-10-19 DIAGNOSIS — I152 Hypertension secondary to endocrine disorders: Secondary | ICD-10-CM | POA: Diagnosis not present

## 2020-10-19 DIAGNOSIS — E1159 Type 2 diabetes mellitus with other circulatory complications: Secondary | ICD-10-CM

## 2020-10-19 DIAGNOSIS — E1169 Type 2 diabetes mellitus with other specified complication: Secondary | ICD-10-CM | POA: Diagnosis not present

## 2020-10-19 DIAGNOSIS — Z794 Long term (current) use of insulin: Secondary | ICD-10-CM | POA: Diagnosis not present

## 2020-10-19 DIAGNOSIS — I5042 Chronic combined systolic (congestive) and diastolic (congestive) heart failure: Secondary | ICD-10-CM

## 2020-10-19 DIAGNOSIS — I110 Hypertensive heart disease with heart failure: Secondary | ICD-10-CM | POA: Diagnosis not present

## 2020-10-19 LAB — POCT GLYCOSYLATED HEMOGLOBIN (HGB A1C): HbA1c, POC (controlled diabetic range): 8.2 % — AB (ref 0.0–7.0)

## 2020-10-19 LAB — GLUCOSE, POCT (MANUAL RESULT ENTRY): POC Glucose: 176 mg/dl — AB (ref 70–99)

## 2020-10-19 NOTE — Patient Instructions (Signed)
Diabetic Neuropathy Diabetic neuropathy refers to nerve damage that is caused by diabetes. Over time, people with diabetes can develop nerve damage throughout the body. There are several types of diabetic neuropathy: Peripheral neuropathy. This is the most common type of diabetic neuropathy. It damages the nerves that carry signals between the spinal cord and other parts of the body (peripheral nerves). This usually affects nerves in the feet, legs, hands, and arms. Autonomic neuropathy. This type causes damage to nerves that control involuntary functions (autonomic nerves). Involuntary functions are functions of the body that you do not control. They include heartbeat, body temperature, blood pressure, urination, digestion, sweating, sexual function, or response to changes in blood glucose. Focal neuropathy. This type of nerve damage affects one area of the body, such as an arm, a leg, or the face. The injury may involve one nerve or a small group of nerves. Focal neuropathy can be painful and unpredictable. It occurs most often in older adults with diabetes. This often develops suddenly, but usually improves over time and does not cause long-term problems. Proximal neuropathy. This type of nerve damage affects the nerves of the thighs, hips, buttocks, or legs. It causes severe pain, weakness, and muscle death (atrophy), usually in the thigh muscles. It is more common among older men and people who have type 2 diabetes. The length of recovery time may vary. What are the causes? Peripheral, autonomic, and focal neuropathies are caused by diabetes that is not well controlled with treatment. The cause of proximal neuropathy is not known, but it may be caused by inflammation related to uncontrolled blood glucose levels. What are the signs or symptoms? Peripheral neuropathy Peripheral neuropathy develops slowly over time. When the nerves of the feet and legs no longer work, you may experience: Burning,  stabbing, or aching pain in the legs or feet. Pain or cramping in the legs or feet. Loss of feeling (numbness) and inability to feel pressure or pain in the feet. This can lead to: Thick calluses or sores on areas of constant pressure. Ulcers. Reduced ability to feel temperature changes. Foot deformities. Muscle weakness. Loss of balance or coordination. Autonomic neuropathy The symptoms of autonomic neuropathy vary depending on which nerves are affected. Symptoms may include: Problems with digestion, such as: Nausea or vomiting. Poor appetite. Bloating. Diarrhea or constipation. Trouble swallowing. Losing weight without trying to. Problems with the heart, blood, and lungs, such as: Dizziness, especially when standing up. Fainting. Shortness of breath. Irregular heartbeat. Bladder problems, such as: Trouble starting or stopping urination. Leaking urine. Trouble emptying the bladder. Urinary tract infections (UTIs). Problems with other body functions, such as: Sweat. You may sweat too much or too little. Temperature. You might get hot easily. Or, you might feel cold more than usual. Sexual function. Men may not be able to get or maintain an erection. Women may have vaginal dryness and difficulty with arousal. Focal neuropathy Symptoms affect only one area of the body. Common symptoms include: Numbness. Tingling. Burning pain. Prickling feeling. Very sensitive skin. Weakness. Inability to move (paralysis). Muscle twitching. Muscles getting smaller (wasting). Poor coordination. Double or blurred vision. Proximal neuropathy Sudden, severe pain in the hip, thigh, or buttocks. Pain may spread from the back into the legs (sciatica). Pain and numbness in the arms and legs. Tingling. Loss of bladder control or bowel control. Weakness and wasting of thigh muscles. Difficulty getting up from a seated position. Abdominal swelling. Unexplained weight loss. How is this  diagnosed? Diagnosis varies depending on the type   of neuropathy your health care provider suspects. Peripheral neuropathy Your health care provider will do a neurologic exam. This exam checks your reflexes, how you move, and what you can feel. You may have other tests, such as: Blood tests. Tests of the fluid that surrounds the spinal cord (lumbar puncture). CT scan. MRI. Checking the nerves that control muscles (electromyogram, or EMG). Checking how quickly signals pass through your nerves (nerve conduction study). Checking a small piece of a nerve using a microscope (biopsy). Autonomic neuropathy You may have tests, such as: Tests to measure your blood pressure and heart rate. You may be secured to an exam table that moves you from a lying position to an upright position (table tilt test). Breathing tests to check your lungs. Tests to check how food moves through the digestive system (gastric emptying tests). Blood, sweat, or urine tests. Ultrasound of your bladder. Spinal fluid tests. Focal neuropathy This condition may be diagnosed with: A neurologic exam. CT scan. MRI. EMG. Nerve conduction study. Proximal neuropathy There is no test to diagnose this type of neuropathy. You may have tests to rule out other possible causes of this type of neuropathy. Tests may include: X-rays of your spine and lumbar region. Lumbar puncture. MRI. How is this treated? The goal of treatment is to keep nerve damage from getting worse. Treatment may include: Following your diabetes management plan. This will help keep your blood glucose level and your A1C level within your target range. This is the most important treatment. Using prescription pain medicine. Follow these instructions at home: Diabetes management Follow your diabetes management plan as told by your health care provider. Check your blood glucose levels. Keep your blood glucose in your target range. Have your A1C level checked at  least two times a year, or as often as told. Take over the counter and prescription medicines only as told by your health care provider. This includes insulin and diabetes medicine.  Lifestyle  Do not use any products that contain nicotine or tobacco, such as cigarettes, e-cigarettes, and chewing tobacco. If you need help quitting, ask your health care provider. Be physically active every day. Include strength training and balance exercises. Follow a healthy meal plan. Work with your health care provider to manage your blood pressure. General instructions Ask your health care provider if the medicine prescribed to you requires you to avoid driving or using machinery. Check your skin and feet every day for cuts, bruises, redness, blisters, or sores. Keep all follow-up visits. This is important. Contact a health care provider if: You have burning, stabbing, or aching pain in your legs or feet. You are unable to feel pressure or pain in your feet. You develop problems with digestion, such as: Nausea. Vomiting. Bloating. Constipation. Diarrhea. Abdominal pain. You have difficulty with urination, such as: Inability to control when you urinate (incontinence). Inability to completely empty the bladder (retention). You feel as if your heart is racing (palpitations). You feel dizzy, weak, or faint when you stand up. Get help right away if: You cannot urinate. You have sudden weakness or loss of coordination. You have trouble speaking. You have pain or pressure in your chest. You have an irregular heartbeat. You have sudden inability to move a part of your body. These symptoms may represent a serious problem that is an emergency. Do not wait to see if the symptoms will go away. Get medical help right away. Call your local emergency services (911 in the [place] Do not drive yourself to   the hospital. Summary Diabetic neuropathy is nerve damage that is caused by diabetes. It can cause numbness  and pain in the arms, legs, digestive tract, heart, and other body systems. This condition is treated by keeping your blood glucose level and your A1C level within your target range. This can help prevent neuropathy from getting worse. Check your skin and feet every day for cuts, bruises, redness, blisters, or sores. Do not use any products that contain nicotine or tobacco, such as cigarettes, e-cigarettes, and chewing tobacco. This information is not intended to replace advice given to you by your health care provider. Make sure you discuss any questions you have with your health care provider. Document Revised: 07/18/2019 Document Reviewed: 07/18/2019 Elsevier Patient Education  2022 [employer] Inc.  

## 2020-10-19 NOTE — Progress Notes (Signed)
Subjective:  Patient ID: Caleb [name], male    DOB: 02-25-1951  Age: 70 y.o. MRN: [id number]  CC: Diabetes   HPI Caleb [name]  is a 30 -year-old male with a history of type 2 diabetes mellitus (A1c 7.6), hypertension, hyperlipidemia, s/p L 4th and 5th  toe metatarsal amputation, PAD status post b/l popliteal artery intervention, bilateral carotid artery stenosis status post bilateral  transcarotid artery revascularization (in 11/2019 and 01/2020), CHF (EF 40 to 45% from echo of 08/2019, grade 1 DD, LVH, LV global hypokinesis, moderate to severe aortic valve stenosis) who presents today for a follow-up visit.  Interval History:  BP is a bit elevated and he endorses taking his medications this morning. Blood sugars in the random range are around 224 and fasting 79-129 His Metformin was reduced to 567m bid as he did have some hypoglycemia but he is on Novolin 70/13 14 units bid  A1c is 8.2 up from 7.6. He has not been consistent with [facility] to cost.  [place] in [place] and feet. He has no chest pain, dyspnea or pedal edema and has no claudication pains. Doing well otherwise.  Past Surgical History:  Procedure Laterality Date   ABDOMINAL AORTOGRAM W/LOWER EXTREMITY N/A 08/08/2019   Procedure: ABDOMINAL AORTOGRAM W/LOWER EXTREMITY;  Surgeon: BLorretta Harp MD;  [facility];  Service: Cardiovascular;  Laterality: N/A;   ABDOMINAL AORTOGRAM W/LOWER EXTREMITY N/A 11/18/2019   Procedure: ABDOMINAL AORTOGRAM W/LOWER EXTREMITY;  Surgeon: BLorretta Harp MD;  [facility];  Service: Cardiovascular;  Laterality: N/A;   [place]  [place] [facility] [place] [place]   KNEE SURGERY     PERIPHERAL VASCULAR INTERVENTION Right 08/08/2019   Procedure: PERIPHERAL VASCULAR INTERVENTION;  Surgeon: BLorretta Harp MD;  [facility];  Service: Cardiovascular;   Laterality: Right;   PERIPHERAL VASCULAR INTERVENTION Left 11/18/2019   Procedure: PERIPHERAL VASCULAR INTERVENTION;  Surgeon: BLorretta Harp MD;  [facility];  Service: Cardiovascular;  Laterality: Left;  left popliteal artery   TRANSCAROTID ARTERY REVASCULARIZATION  Left 12/16/2019   Procedure: TRANSCAROTID ARTERY REVASCULARIZATION;  Surgeon: FElam Dutch MD;  [facility];  Service: Vascular;  Laterality: Left;   TRANSCAROTID ARTERY REVASCULARIZATION  Right 02/03/2020   Procedure: RIGHT TRANSCAROTID ARTERY REVASCULARIZATION;  Surgeon: FElam Dutch MD;  [facility];  Service: Vascular;  Laterality: Right;   ULTRASOUND GUIDANCE FOR VASCULAR ACCESS Right 12/16/2019   Procedure: ULTRASOUND GUIDANCE FOR VASCULAR ACCESS;  Surgeon: FElam Dutch MD;  [facility];  Service: Vascular;  Laterality: Right;   ULTRASOUND GUIDANCE FOR VASCULAR ACCESS Right 02/03/2020   Procedure: ULTRASOUND GUIDANCE FOR VASCULAR ACCESS;  Surgeon: FElam Dutch MD;  [facility];  Service: Vascular;  Laterality: Right;    Family History  Problem Relation Age of Onset   CAD Father    Hypertension Father    Alcohol abuse Father        Cause of death   Diabetes Mother    Colon polyps Mother    CAD Brother 579      CABG   Colon cancer Neg Hx    Esophageal cancer Neg Hx    Rectal cancer Neg Hx    Stomach cancer Neg Hx     Allergies  Allergen Reactions   Percocet [Oxycodone-Acetaminophen]  Nausea And Vomiting    10/325. Pt has 3 doses of this medication. N/V, temperature 97, no chills and slight sweating. Pt dressing was C/D/I no drainage noted.     Outpatient Medications Prior to Visit  Medication Sig Dispense Refill   amLODipine (NORVASC) 5 MG tablet Take 1 tablet by mouth daily.     aspirin EC 81 MG tablet Take 81 mg by mouth daily.     atorvastatin (LIPITOR) 40 MG tablet Take by mouth.     clopidogrel (PLAVIX) 75 MG tablet TAKE 1 TABLET (75 MG TOTAL) BY  MOUTH DAILY. 30 tablet 11   Continuous Blood Gluc Receiver (FREESTYLE LIBRE 14 DAY READER) DEVI 1 each by Does not apply route See admin instructions. Use as directed to check blood glucose 1 each 0   Continuous Blood Gluc Sensor (FREESTYLE LIBRE 14 DAY SENSOR) MISC INJECT IN THE SKIN EVERY 14 (FOURTEEN) DAYS. E11.69 Z79.4 6 each 3   dapagliflozin propanediol (FARXIGA) 10 MG TABS tablet Take 1 tablet (10 mg total) by mouth daily before breakfast. 90 tablet 1   gabapentin (NEURONTIN) 300 MG capsule TAKE 1 CAPSULE TWICE DAILY 180 capsule 0   gabapentin (NEURONTIN) 800 MG tablet Take 1 tablet by mouth 2 (two) times daily.     glucose blood test strip Use as instructed twice daily, check fasting sugar in morning and check before bedtime. E11.8 100 each 12   insulin aspart protamine- aspart (NOVOLOG MIX 70/30) (70-30) 100 UNIT/ML injection Inject into the skin.     insulin NPH-regular Human (NOVOLIN 70/30) (70-30) 100 UNIT/ML injection Inject 12 Units into the skin 2 (two) times daily with a meal. 20 mL 6   Insulin Syringe-Needle U-100 (TRUEPLUS INSULIN SYRINGE) 31G X 5/16" 0.3 ML MISC Use as instructed to inject insulin daily. 100 each 2   Insulin Syringe-Needle U-100 30G X 5/16" 0.3 ML MISC use as instructed to inject insulin daily 100 each 2   lisinopril-hydrochlorothiazide (ZESTORETIC) 20-12.5 MG tablet TAKE 2 TABLETS EVERY DAY 180 tablet 0   Menthol-Methyl Salicylate (THERA-GESIC) 0.5-15 % CREA Apply topically.     metFORMIN (GLUCOPHAGE) 1000 MG tablet Take 1 tablet (1,000 mg total) by mouth 2 (two) times daily with a meal. (Patient taking differently: Take 1,000 mg by mouth 2 (two) times daily with a meal. Taking I/2 tab(546m) BID) 360 tablet 1   metFORMIN (GLUCOPHAGE) 500 MG tablet Take 1 tablet by mouth 2 (two) times daily.     methocarbamol (ROBAXIN) 750 MG tablet Take 1 tablet by mouth at bedtime.     metoprolol tartrate (LOPRESSOR) 50 MG tablet Take by mouth.     MODERNA COVID-19 VACCINE 100  MCG/0.5ML injection      mupirocin ointment (BACTROBAN) 2 % Apply topically.     ondansetron (ZOFRAN) 4 MG tablet Take 1 tablet (4 mg total) by mouth every 8 (eight) hours as needed for nausea or vomiting. 30 tablet 0   ONETOUCH DELICA LANCETS 378IMISC Use as instructed twice daily, check fasting sugar in morning and check before bedtime. E11.8 100 each 12   rosuvastatin (CRESTOR) 40 MG tablet Take 1 tablet (40 mg total) by mouth daily. To lower cholesterol 90 tablet 1   sildenafil (VIAGRA) 50 MG tablet TAKE 2 TABLETS DAILY AS NEEDED FOR ERECTILE DYSFUNCTION 18 tablet 0   silver sulfADIAZINE (SILVADENE) 1 % cream Apply to affected toes once daily and cover with dressing. 400 g 1   Blood Glucose Monitoring Suppl (ONETOUCH VERIO) w/Device KIT 1  each by Does not apply route 2 (two) times daily. Use as instructed twice daily, check fasting sugar in morning and check before bedtime. E11.8 (Patient not taking: No sig reported) 1 kit 0   collagenase (SANTYL) ointment Apply 1 application topically daily. (Patient not taking: Reported on 10/19/2020) 15 g 0   collagenase (SANTYL) ointment Apply 1 application topically daily. Apply Santyl Ointment to scab on left 3rd and 4th digits. Measurements: Left 3rd toe 1.0 x 0.6 cm Left 4th toe: 2.0 x 1.0 cm (Patient not taking: Reported on 10/19/2020) 30 g 0   collagenase (SANTYL) ointment Apply 1 application topically daily. (Patient not taking: Reported on 10/19/2020) 90 each 0   Facility-Administered Medications Prior to Visit  Medication Dose Route Frequency Provider Last Rate Last Admin   sodium chloride flush (NS) 0.9 % injection 3 mL  3 mL Intravenous Q12H Lorretta Harp, MD       sodium chloride flush (NS) 0.9 % injection 3 mL  3 mL Intravenous Q12H Lorretta Harp, MD         ROS Review of Systems  Constitutional:  Negative for activity change and appetite change.  HENT:  Negative for sinus pressure and sore throat.   Eyes:  Negative for visual  disturbance.  Respiratory:  Negative for cough, chest tightness and shortness of breath.   Cardiovascular:  Negative for chest pain and leg swelling.  Gastrointestinal:  Negative for abdominal distention, abdominal pain, constipation and diarrhea.  Endocrine: Negative.   Genitourinary:  Negative for dysuria.  Musculoskeletal:  Negative for joint swelling and myalgias.  Skin:  Negative for rash.  Allergic/Immunologic: Negative.   Neurological:  Positive for numbness. Negative for weakness and light-headedness.  Psychiatric/Behavioral:  Negative for dysphoric mood and suicidal ideas.    Objective:  BP (!) 149/77   Pulse 74   Ht '5\' 8"'  (1.727 m)   Wt 179 lb 9.6 oz (81.5 kg)   SpO2 100%   BMI 27.31 kg/m   BP/Weight 10/19/2020 07/22/2020 4/94/4967  Systolic BP 591 638 466  Diastolic BP 77 72 599  Wt. (Lbs) 179.6 182 176.6  BMI 27.31 27.67 26.85      Physical Exam Constitutional:      Appearance: He is well-developed.  Cardiovascular:     Rate and Rhythm: Normal rate.     Heart sounds: Murmur heard.  Pulmonary:     Effort: Pulmonary effort is normal.     Breath sounds: Normal breath sounds. No wheezing or rales.  Chest:     Chest wall: No tenderness.  Abdominal:     General: Bowel sounds are normal. There is no distension.     Palpations: Abdomen is soft. There is no mass.     Tenderness: There is no abdominal tenderness.  Musculoskeletal:        General: Normal range of motion.     Right lower leg: No edema.     Left lower leg: No edema.  Neurological:     Mental Status: He is alert and oriented to person, place, and time.  Psychiatric:        Mood and Affect: Mood normal.   Diabetic Foot Exam - Simple   Simple Foot Form Diabetic Foot exam was performed with the following findings: Yes 10/19/2020  9:45 AM  Visual Inspection See comments: Yes Sensation Testing See comments: Yes Pulse Check See comments: Yes Comments Amputation of L lateral 2 toes, abnormal  monofilament testing bilaterally Unable to palpate bilateral posterior  tibialis and dorsalis pedis     CMP Latest Ref Rng & Units 06/05/2020 05/27/2020 02/04/2020  Glucose 70 - 99 mg/dL - 173(H) 179(H)  BUN 8 - 23 mg/dL - 31(H) 32(H)  Creatinine 0.61 - 1.24 mg/dL - 1.47(H) 1.43(H)  Sodium 135 - 145 mmol/L - 139 135  Potassium 3.5 - 5.1 mmol/L - 4.3 4.4  Chloride 98 - 111 mmol/L - 102 102  CO2 22 - 32 mmol/L - 23 23  Calcium 8.9 - 10.3 mg/dL - 10.0 8.3(L)  Total Protein 6.0 - 8.5 g/dL 7.5 - -  Total Bilirubin 0.0 - 1.2 mg/dL 0.2 - -  Alkaline Phos 44 - 121 IU/L 130(H) - -  AST 0 - 40 IU/L 26 - -  ALT 0 - 44 IU/L 24 - -    Lipid Panel     Component Value Date/Time   CHOL 106 06/05/2020 0947   TRIG 138 06/05/2020 0947   HDL 53 06/05/2020 0947   CHOLHDL 2.0 06/05/2020 0947   CHOLHDL 3.9 04/02/2013 1632   VLDL 66 (H) 04/02/2013 1632   LDLCALC 29 06/05/2020 0947    CBC    Component Value Date/Time   WBC 6.7 05/27/2020 1546   RBC 5.51 05/27/2020 1546   HGB 15.3 05/27/2020 1546   HGB 13.3 11/15/2019 1139   HCT 48.2 05/27/2020 1546   HCT 40.2 11/15/2019 1139   PLT 298 05/27/2020 1546   PLT 300 11/15/2019 1139   MCV 87.5 05/27/2020 1546   MCV 86 11/15/2019 1139   MCH 27.8 05/27/2020 1546   MCHC 31.7 05/27/2020 1546   RDW 13.6 05/27/2020 1546   RDW 14.5 11/15/2019 1139   LYMPHSABS 3.3 (H) 09/28/2017 1113   MONOABS 0.5 06/09/2015 1705   EOSABS 0.3 09/28/2017 1113   BASOSABS 0.0 09/28/2017 1113    Lab Results  Component Value Date   HGBA1C 8.2 (A) 10/19/2020    Assessment & Plan:  1. Type 2 diabetes mellitus with diabetic peripheral angiopathy without gangrene, with long-term current use of insulin (HCC) Uncontrolled with A1c of 8.2; goal is less than 7.0 Inconsistent with [place] and decreased dose of metformin could be contributing [facility] and if unsuccessful advised to reach out to the clinic to review after  patient assistance options Counseled on Diabetic diet, my plate method, 644 minutes of moderate intensity exercise/week Blood sugar logs with fasting goals of 80-120 mg/dl, random of less than 180 and in the event of sugars less than 60 mg/dl or greater than 400 mg/dl encouraged to notify the clinic. Advised on the need for annual eye exams, annual foot exams, Pneumonia vaccine. - POCT glucose (manual entry) - POCT glycosylated hemoglobin (Hb A1C)  2. Hypertensive heart disease with chronic combined systolic and diastolic congestive heart failure (HCC) EF of 40 to 45%, global hypokinesis, moderate to severe aortic valve stenosis Euvolemic Currently on SGLT2  3. Dyslipidemia associated with type 2 diabetes mellitus (HCC) Controlled Low-cholesterol diet Continue Crestor  4. Hypertension complicating diabetes ([place]) Slightly above goal No regimen change today as blood pressures have been normal previously Counseled on blood pressure goal of less than 130/80, low-sodium, DASH diet, medication compliance, 150 minutes of moderate intensity exercise per week. Discussed medication compliance, adverse effects.  5. Type 2 diabetes mellitus with hypoglycemia without coma, with long-term current use of insulin ([place]) Advised that if hypoglycemia occurs he needs to notify the clinic so his insulin regimen can be  adjusted   No orders of the defined types were placed in this encounter.   Follow-up: Return in about 3 months (around 01/19/2021) for Medical conditions.       [name], MD, FAAFP. [facility] [place], [place]   10/19/2020, 1:38 PM

## 2020-11-13 ENCOUNTER — Other Ambulatory Visit: Payer: Self-pay

## 2020-11-13 NOTE — Patient Outreach (Signed)
[facility]) Care Management  11/13/2020  Caleb [name] 02-20-1951 [id number]   Telephone Assessment    Successful outreach call placed to patient. He reports he is doing well. Denies any acute issues or concerns. Patient has relocated to new apartment and adjusting well. He has supportive family/friends who checks on him daily. He denies any RN CM needs or concerns at this time.   Medications Reviewed Today     Reviewed by Hayden Pedro, RN (Registered Nurse) on 11/13/20 at [place] List Status: <None>   Medication Order Taking? Sig Documenting Provider Last Dose Status Informant  amLODipine (NORVASC) 5 MG tablet [id number]  Take 1 tablet by mouth daily. [provider]  Active   aspirin EC 81 MG tablet [id number]  Take 81 mg by mouth daily. [provider]  Active Self  atorvastatin (LIPITOR) 40 MG tablet [id number]  Take by mouth. [provider]  Active   Blood Glucose Monitoring Suppl (ONETOUCH VERIO) w/Device KIT [id number]  1 each by Does not apply route 2 (two) times daily. Use as instructed twice daily, check fasting sugar in morning and check before bedtime. E11.8  Patient not taking: No sig reported   [name], MD  Active   clopidogrel (PLAVIX) 75 MG tablet [id number]  TAKE 1 TABLET (75 MG TOTAL) BY MOUTH DAILY. [name], MD  Active   collagenase (SANTYL) ointment [id number]  Apply 1 application topically daily.  Patient not taking: Reported on 10/19/2020   Felipa Furnace, DPM  Active   collagenase Annitta Needs) ointment [id number]  Apply 1 application topically daily. Apply Santyl Ointment to scab on left 3rd and 4th digits. Measurements: Left 3rd toe 1.0 x 0.6 cm Left 4th toe: 2.0 x 1.0 cm  Patient not taking: Reported on 10/19/2020   Felipa Furnace, DPM  Active   collagenase Annitta Needs) ointment [id number]  Apply 1 application topically daily.  Patient not taking: Reported on 10/19/2020   Felipa Furnace, DPM  Active    Continuous Blood Gluc Receiver (FREESTYLE LIBRE 14 DAY READER) DEVI [id number]  1 each by Does not apply route See admin instructions. Use as directed to check blood glucose [name], MD  Active Self  Continuous Blood Gluc Sensor (FREESTYLE LIBRE 14 DAY SENSOR) MISC [id number]  INJECT IN THE SKIN EVERY 14 (FOURTEEN) DAYS. E11.69 Z79.4 [name], MD  Active Self  dapagliflozin propanediol (FARXIGA) 10 MG TABS tablet [id number]  Take 1 tablet (10 mg total) by mouth daily before breakfast. [name], MD  Active   empagliflozin (JARDIANCE) 25 MG TABS tablet [id number] Yes Take 25 mg by mouth daily. Pt states only taking 1/2 tab per day [provider]  Active Self  gabapentin (NEURONTIN) 300 MG capsule [id number]  TAKE 1 CAPSULE TWICE DAILY Newlin, Enobong, MD  Active   gabapentin (NEURONTIN) 800 MG tablet [id number]  Take 1 tablet by mouth 2 (two) times daily. [provider]  Active   glucose blood test strip [id number]  Use as instructed twice daily, check fasting sugar in morning and check before bedtime. E11.8 [name], MD  Active Self  insulin aspart protamine- aspart (NOVOLOG MIX 70/30) (70-30) 100 UNIT/ML injection [id number]  Inject into the skin. [provider]  Active   insulin NPH-regular Human (NOVOLIN 70/30) (70-30) 100 UNIT/ML injection [id number]  Inject 12 Units into the skin 2 (two) times daily with a meal. [name], MD  Active   Insulin Syringe-Needle U-100 (TRUEPLUS INSULIN SYRINGE) 31G  X 5/16" 0.3 ML MISC [id number]  Use as instructed to inject insulin daily. [name], MD  Active Self  Insulin Syringe-Needle U-100 30G X 5/16" 0.3 ML MISC [id number]  use as instructed to inject insulin daily [name], MD  Active   lisinopril-hydrochlorothiazide (ZESTORETIC) 20-12.5 MG tablet [id number]  TAKE 2 TABLETS EVERY DAY [name], MD  Active   Menthol-Methyl Salicylate (THERA-GESIC) 0.5-15 % CREA [id number]  Apply topically.  [provider]  Active   metFORMIN (GLUCOPHAGE) 1000 MG tablet [id number]  Take 1 tablet (1,000 mg total) by mouth 2 (two) times daily with a meal.  Patient taking differently: Take 1,000 mg by mouth 2 (two) times daily with a meal. Taking I/2 tab(575m) BID   NCharlott Rakes MD  Active   metFORMIN (GLUCOPHAGE) 500 MG tablet [id number] Take 1 tablet by mouth 2 (two) times daily. [provider]  Active   methocarbamol (ROBAXIN) 750 MG tablet [id number] Take 1 tablet by mouth at bedtime. [provider]  Active   metoprolol tartrate (LOPRESSOR) 50 MG tablet [id number] Take by mouth. [provider]  Active   MODERNA COVID-19 VACCINE 100 MCG/0.5ML injection [id number]   Patient not taking: Reported on 11/13/2020   [provider] Not Taking Active   mupirocin ointment (BACTROBAN) 2 % [id number] Apply topically. [provider]  Active   ondansetron (ZOFRAN) 4 MG tablet [id number] Take 1 tablet (4 mg total) by mouth every 8 (eight) hours as needed for nausea or vomiting. EEdrick Kins DPM  Active Self  OJonetta SpeakLANCETS 337GMISC [id number] Use as instructed twice daily, check fasting sugar in morning and check before bedtime. E11.8 NCharlott Rakes MD  Active   rosuvastatin (CRESTOR) 40 MG tablet [id number] Take 1 tablet (40 mg total) by mouth daily. To lower cholesterol NCharlott Rakes MD  Active   sildenafil (VIAGRA) 50 MG tablet [id number] TAKE 2 TABLETS DAILY AS NEEDED FOR ERECTILE DYSFUNCTION NCharlott Rakes MD  Active   silver sulfADIAZINE (SILVADENE) 1 % cream [id number] Apply to affected toes once daily and cover with dressing. GMarzetta Board DPM  Active Self  sodium chloride flush (NS) 0.9 % injection 3 mL [id number]  BLorretta Harp MD  Active   sodium chloride flush (NS) 0.9 % injection 3 mL [id number]  BLorretta Harp MD  Active              Goals Addressed               This Visit's Progress      (THN)Set My Target A1C-Diabetes Type 2 (pt-stated)        Timeframe:  Long-Range Goal Priority:  High Start Date:  08/27/2020                           Expected End Date:  03/19/2021                     Follow Up Date Nov2022  Barriers: Health Behaviors Knowledge    - set target A1C-lowering of A1C level from 7.6    Why is this important?   Your target A1C is decided together by you and your doctor.  It is based on several things like your age and other health issues.    Notes:  08/27/20 Patient has freestyle Libre meter. Blood sugar this morning was 90-fasting. He reports  cbgs normally range in the 70s to low 100s. Last A1C on file 7.69(May 2022).   11/13/20-Patient had recent PCP visit with lab work done. He reports A1C level has slightly increased to 8.2. He has been started on Jardiance daily. Patient reports he has noticed a difference in cbgs since starting meds. Blood sugars ranging more in the low to mid 100's. Blood sugar reported today as 136-non fasting. Patient states he is being more diligent with making better food choices as well.          Plan: RN CM discussed with patient next outreach within the month of Nov. Patient agrees to care plan and follow up. Patient gave verbal consent and in agreement with RN CM follow up and timeframe. Patient aware that they may contact RN CM sooner for any issues or concerns. RN CM reviewed goals and plan of care with patient.   Enzo Montgomery, RN,BSN,CCM [facility] Telephonic Care Management Coordinator Direct Phone: [phone] Toll Free: [phone] Fax: [phone]

## 2020-11-15 ENCOUNTER — Telehealth: Payer: Self-pay

## 2020-11-15 NOTE — Telephone Encounter (Signed)
Called pt to schedule AWV, no answer, mailbox full.

## 2020-11-16 ENCOUNTER — Other Ambulatory Visit (HOSPITAL_COMMUNITY): Payer: Medicare HMO

## 2020-11-25 ENCOUNTER — Encounter: Payer: Self-pay | Admitting: Family Medicine

## 2020-11-25 ENCOUNTER — Ambulatory Visit: Payer: Self-pay

## 2020-11-25 NOTE — Telephone Encounter (Signed)
Patient called and says his blood sugar was reading 44 about 1820 this evening, no symptoms reported. He says he drank about 10 oz orange juice and the reading is now 82. He says this morning at 8 am, his blood sugar was 70, 1100-165, 1130-140, 1400-60's (ate oatmeal, spaghetti/meatballs, no symptoms reported). Patient reports yesterday after exercising for 40 minutes, 4 miles total on bike and treadmill, his blood sugar dropped to 47, no reported symptoms. Patient reports no symptoms at this time. Advised to eat dinner and a bedtime snack, care advice given, advised Dr. Margarita Rana will review this note and someone will call with her recommendation. Patient verbalized understanding.   Reason for Disposition  [1] Morning (before breakfast) blood glucose < 80 mg/dL (4.4 mmol/L) AND [2] more than once in past week  Answer Assessment - Initial Assessment Questions 1. SYMPTOMS: "What symptoms are you concerned about?"     No symptoms 2. ONSET:  "When did the symptoms start?"     N/A 3. BLOOD GLUCOSE: "What is your blood glucose level?"      44 around 1820, now 88 after drinking 8-10 oz orange juice 4. USUAL RANGE: "What is your blood glucose level usually?" (e.g., usual fasting morning value, usual evening value)     Morning 70-90; 2 hours ago  5. TYPE 1 or 2:  "Do you know what type of diabetes you have?"  (e.g., Type 1, Type 2, Gestational; doesn't know)      Type 2 6. INSULIN: "Do you take insulin?" "What type of insulin(s) do you use? What is the mode of delivery? (syringe, pen; injection or pump) "When did you last give yourself an insulin dose?" (i.e., time or hours/minutes ago) "How much did you give?" (i.e., how many units)     Novolin 14 units BID 7. DIABETES PILLS: "Do you take any pills for your diabetes?"     Jardiance 3 weeks ago, Metformin 8. OTHER SYMPTOMS: "Do you have any symptoms?" (e.g., fever, frequent urination, difficulty breathing, vomiting)     No symptoms at this time 9. LOW  BLOOD GLUCOSE TREATMENT: "What have you done so far to treat the low blood glucose level?"     [place] Juice 10. FOOD: "When did you last eat or drink?"       Nothing at this time, 2 hours ago ate spaghetti w/meatballs and oatmeal 11. ALONE: "Are you alone right now or is someone with you?"        Yes 12. PREGNANCY: "Is there any chance you are pregnant?" "When was your last menstrual period?"       N/A  Protocols used: Diabetes - Low Blood Sugar-A-AH

## 2020-11-26 ENCOUNTER — Other Ambulatory Visit: Payer: Self-pay | Admitting: Family Medicine

## 2020-11-26 DIAGNOSIS — E119 Type 2 diabetes mellitus without complications: Secondary | ICD-10-CM | POA: Diagnosis not present

## 2020-11-26 DIAGNOSIS — Z794 Long term (current) use of insulin: Secondary | ICD-10-CM

## 2020-11-26 DIAGNOSIS — E1151 Type 2 diabetes mellitus with diabetic peripheral angiopathy without gangrene: Secondary | ICD-10-CM

## 2020-11-26 NOTE — Telephone Encounter (Signed)
Called pt made aware of below message.

## 2020-11-26 NOTE — Telephone Encounter (Signed)
Please advise him to decrease his insulin from 12 units twice daily to 8 units twice daily.  I have reviewed his med list and I prescribed Wilder Glade for him but it also appears he may be receiving Jardiance from the [place] or another doctor.  He should not be on both [place] as they are in the same class of medications.  It might be that his going to the [place] and coming here as well is causing some confusion with regards to his medications.  Can you please have him scheduled with Lurena Joiner to come in with all his medications for medication reconciliation?  Thank you.

## 2020-12-06 ENCOUNTER — Ambulatory Visit (HOSPITAL_BASED_OUTPATIENT_CLINIC_OR_DEPARTMENT_OTHER): Payer: Medicare HMO

## 2020-12-06 DIAGNOSIS — Z0000 Encounter for general adult medical examination without abnormal findings: Secondary | ICD-10-CM | POA: Diagnosis not present

## 2020-12-06 NOTE — Progress Notes (Signed)
Subjective:   Caleb [name] is a 70 y.o. male who presents for an Initial Medicare Annual Wellness Visit.I connected with  Caleb [name] on 12/06/20 by a audio enabled telemedicine application and verified that I am speaking with the correct person using two identifiers.   I discussed the limitations of evaluation and management by telemedicine. The patient expressed understanding and agreed to proceed.   Location of patient: Home Location of provider: Office  Persons participating in visit: Caleb [name] (patient) and Drenda Freeze, [place]  Review of Systems    Defer to PCP       Objective:    There were no vitals filed for this visit. There is no height or weight on file to calculate BMI.  Advanced Directives 12/06/2020 08/27/2020 01/30/2020 12/11/2019 11/18/2019 08/08/2019 12/13/2016  Does Patient Have a Medical Advance Directive? No;Yes No Yes Yes Yes Yes No  Type of Advance Directive - - Living will;Healthcare Power of Attorney Living will;Healthcare Power of [place];Living will [place];Living will -  Does patient want to make changes to medical advance directive? - - No - Patient declined - No - Patient declined No - Patient declined -  Copy of [place] in Chart? - - No - copy requested No - copy requested No - copy requested No - copy requested -  Would patient like information on creating a medical advance directive? - No - Patient declined - - - - -  Pre-existing out of facility DNR order (yellow form or pink MOST form) - - - - - - -    Current Medications (verified) Outpatient Encounter Medications as of 12/06/2020  Medication Sig   amLODipine (NORVASC) 5 MG tablet Take 1 tablet by mouth daily.   aspirin EC 81 MG tablet Take 81 mg by mouth daily.   atorvastatin (LIPITOR) 40 MG tablet Take by mouth.   clopidogrel (PLAVIX) 75 MG tablet TAKE 1 TABLET (75 MG TOTAL) BY MOUTH DAILY.   Continuous Blood Gluc  Receiver (FREESTYLE LIBRE 14 DAY READER) DEVI 1 each by Does not apply route See admin instructions. Use as directed to check blood glucose   Continuous Blood Gluc Sensor (FREESTYLE LIBRE 14 DAY SENSOR) MISC INJECT IN THE SKIN EVERY 14 (FOURTEEN) DAYS. E11.69 Z79.4   dapagliflozin propanediol (FARXIGA) 10 MG TABS tablet Take 1 tablet (10 mg total) by mouth daily before breakfast.   empagliflozin (JARDIANCE) 25 MG TABS tablet Take 25 mg by mouth daily. Pt states only taking 1/2 tab per day   gabapentin (NEURONTIN) 300 MG capsule TAKE 1 CAPSULE TWICE DAILY   gabapentin (NEURONTIN) 800 MG tablet Take 1 tablet by mouth 2 (two) times daily.   glucose blood test strip Use as instructed twice daily, check fasting sugar in morning and check before bedtime. E11.8   insulin aspart protamine- aspart (NOVOLOG MIX 70/30) (70-30) 100 UNIT/ML injection Inject into the skin.   insulin NPH-regular Human (NOVOLIN 70/30) (70-30) 100 UNIT/ML injection Inject 12 Units into the skin 2 (two) times daily with a meal.   Insulin Syringe-Needle U-100 (TRUEPLUS INSULIN SYRINGE) 31G X 5/16" 0.3 ML MISC Use as instructed to inject insulin daily.   Insulin Syringe-Needle U-100 30G X 5/16" 0.3 ML MISC use as instructed to inject insulin daily   lisinopril-hydrochlorothiazide (ZESTORETIC) 20-12.5 MG tablet TAKE 2 TABLETS EVERY DAY   Menthol-Methyl Salicylate (THERA-GESIC) 0.5-15 % CREA Apply topically.   metFORMIN (GLUCOPHAGE) 1000 MG tablet Take 1  tablet (1,000 mg total) by mouth 2 (two) times daily with a meal. (Patient taking differently: Take 1,000 mg by mouth 2 (two) times daily with a meal. Taking I/2 tab(564m) BID)   metFORMIN (GLUCOPHAGE) 500 MG tablet Take 1 tablet by mouth 2 (two) times daily.   methocarbamol (ROBAXIN) 750 MG tablet Take 1 tablet by mouth at bedtime.   metoprolol tartrate (LOPRESSOR) 50 MG tablet Take by mouth.   mupirocin ointment (BACTROBAN) 2 % Apply topically.   ondansetron (ZOFRAN) 4 MG tablet Take  1 tablet (4 mg total) by mouth every 8 (eight) hours as needed for nausea or vomiting.   ONETOUCH DELICA LANCETS 312YMISC Use as instructed twice daily, check fasting sugar in morning and check before bedtime. E11.8   rosuvastatin (CRESTOR) 40 MG tablet Take 1 tablet (40 mg total) by mouth daily. To lower cholesterol   sildenafil (VIAGRA) 50 MG tablet TAKE 2 TABLETS DAILY AS NEEDED FOR ERECTILE DYSFUNCTION   silver sulfADIAZINE (SILVADENE) 1 % cream Apply to affected toes once daily and cover with dressing.   Blood Glucose Monitoring Suppl (ONETOUCH VERIO) w/Device KIT 1 each by Does not apply route 2 (two) times daily. Use as instructed twice daily, check fasting sugar in morning and check before bedtime. E11.8 (Patient not taking: No sig reported)   collagenase (SANTYL) ointment Apply 1 application topically daily. (Patient not taking: No sig reported)   collagenase (SANTYL) ointment Apply 1 application topically daily. Apply Santyl Ointment to scab on left 3rd and 4th digits. Measurements: Left 3rd toe 1.0 x 0.6 cm Left 4th toe: 2.0 x 1.0 cm (Patient not taking: No sig reported)   collagenase (SANTYL) ointment Apply 1 application topically daily. (Patient not taking: No sig reported)   MODERNA COVID-19 VACCINE 100 MCG/0.5ML injection  (Patient not taking: No sig reported)   Facility-Administered Encounter Medications as of 12/06/2020  Medication   sodium chloride flush (NS) 0.9 % injection 3 mL   sodium chloride flush (NS) 0.9 % injection 3 mL    Allergies (verified) Percocet [oxycodone-acetaminophen]   History: Past Medical History:  Diagnosis Date   Aortic stenosis    Carotid artery occlusion    Diabetes mellitus without complication (HOnset    Dyslipidemia 09/27/2012   Erectile dysfunction 09/27/2012   Heart murmur    Hypercholesteremia    Hyperlipidemia 08/12/2012   Hypertension    Hyponatremia 08/14/2012   Neuropathy    Pancreatitis    Pancreatitis, acute 09/27/2012   Peripheral  vascular disease ([place]    Vitamin D deficiency 06/23/2015   Past Surgical History:  Procedure Laterality Date   ABDOMINAL AORTOGRAM W/LOWER EXTREMITY N/A 08/08/2019   Procedure: ABDOMINAL AORTOGRAM W/LOWER EXTREMITY;  Surgeon: BLorretta Harp MD;  [facility];  Service: Cardiovascular;  Laterality: N/A;   ABDOMINAL AORTOGRAM W/LOWER EXTREMITY N/A 11/18/2019   Procedure: ABDOMINAL AORTOGRAM W/LOWER EXTREMITY;  Surgeon: BLorretta Harp MD;  [facility];  Service: Cardiovascular;  Laterality: N/A;   [facility]  12 [place]   [facility] [place]   KNEE SURGERY     PERIPHERAL VASCULAR INTERVENTION Right 08/08/2019   Procedure: PERIPHERAL VASCULAR INTERVENTION;  Surgeon: BLorretta Harp MD;  [facility];  Service: Cardiovascular;  Laterality: Right;   PERIPHERAL VASCULAR INTERVENTION Left 11/18/2019   Procedure: PERIPHERAL VASCULAR INTERVENTION;  Surgeon: BLorretta Harp MD;  [facility];  Service: Cardiovascular;  Laterality: Left;  left popliteal  artery   TRANSCAROTID ARTERY REVASCULARIZATION  Left 12/16/2019   Procedure: TRANSCAROTID ARTERY REVASCULARIZATION;  Surgeon: Elam Dutch, MD;  Location[facility] OR;  Service: Vascular;  Laterality: Left;   TRANSCAROTID ARTERY REVASCULARIZATION  Right 02/03/2020   Procedure: RIGHT TRANSCAROTID ARTERY REVASCULARIZATION;  Surgeon: Elam Dutch, MD;  Location: [facility] OR;  Service: Vascular;  Laterality: Right;   ULTRASOUND GUIDANCE FOR VASCULAR ACCESS Right 12/16/2019   Procedure: ULTRASOUND GUIDANCE FOR VASCULAR ACCESS;  Surgeon: Elam Dutch, MD;  Location: [facility] OR;  Service: Vascular;  Laterality: Right;   ULTRASOUND GUIDANCE FOR VASCULAR ACCESS Right 02/03/2020   Procedure: ULTRASOUND GUIDANCE FOR VASCULAR ACCESS;  Surgeon: Elam Dutch, MD;  Location: [facility] OR;  Service: Vascular;  Laterality: Right;   Family History  Problem Relation Age of Onset   CAD  Father    Hypertension Father    Alcohol abuse Father        Cause of death   Diabetes Mother    Colon polyps Mother    CAD Brother 56       CABG   Colon cancer Neg Hx    Esophageal cancer Neg Hx    Rectal cancer Neg Hx    Stomach cancer Neg Hx    Social History   Socioeconomic History   Marital status: Widowed    Spouse name: Not on file   Number of children: Not on file   Years of education: Not on file   Highest education level: Not on file  Occupational History   Occupation: Caregiver    Comment: Special needs  Tobacco Use   Smoking status: Never   Smokeless tobacco: Never  Vaping Use   Vaping Use: Never used  Substance and Sexual Activity   Alcohol use: Not Currently    Comment: rare   Drug use: No   Sexual activity: Not Currently  Other Topics Concern   Not on file  Social History Narrative   Widower.  3 daughters and 3 grandchildren.     Social Determinants of Health   Financial Resource Strain: Low Risk    Difficulty of Paying Living Expenses: Not hard at all  Food Insecurity: Food Insecurity Present   Worried About Charity fundraiser in the Last Year: Never true   Arboriculturist in the Last Year: Sometimes true  Transportation Needs: No Transportation Needs   Lack of Transportation (Medical): No   Lack of Transportation (Non-Medical): No  Physical Activity: Sufficiently Active   Days of Exercise per Week: 4 days   Minutes of Exercise per Session: 60 min  Stress: No Stress Concern Present   Feeling of Stress : Not at all  Social Connections: Moderately Isolated   Frequency of Communication with Friends and Family: More than three times a week   Frequency of Social Gatherings with Friends and Family: More than three times a week   Attends Religious Services: More than 4 times per year   Active Member of [employer] or Organizations: No   Attends Archivist Meetings: Never   Marital Status: Widowed    Tobacco Counseling Counseling given: Not  Answered   Clinical Intake:  Pre-visit preparation completed: No        Diabetes: Yes CBG done?: No CBG resulted in Enter/ Edit results?: No Did pt. bring in CBG monitor from home?: No  How often do you need to have someone help you when you read instructions, pamphlets, or other written materials from your doctor  or pharmacy?: 1 - Never What is the last grade level you completed in school?: 3 yrs of college  Diabetic?Yes  Interpreter Needed?: No  Information entered by :: Drenda Freeze, [place]   Activities of Daily Living In your present state of health, do you have any difficulty performing the following activities: 12/06/2020 08/27/2020  Hearing? N N  Vision? N N  Difficulty concentrating or making decisions? N N  Walking or climbing stairs? N N  Dressing or bathing? N N  Doing errands, shopping? N N  Preparing Food and eating ? N N  Using the Toilet? N N  In the past six months, have you accidently leaked urine? N N  Do you have problems with loss of bowel control? N N  Managing your Medications? N N  Managing your Finances? N N  Housekeeping or managing your Housekeeping? N N  Some recent data might be hidden    Patient Care Team: [name], MD as PCP - General (Family Medicine) Lorretta Harp, MD as PCP - Cardiology (Cardiology) [place], Tomasa Blase, RN as [place] any recent [place] you may have received from other than [facility] providers in the past year (date may be approximate).     Assessment:   This is a routine wellness examination for Caleb [name].  Hearing/Vision screen No results found.  Dietary issues and exercise activities discussed:     Goals Addressed   None    Depression Screen PHQ 2/9 Scores 12/06/2020 10/19/2020 08/27/2020 07/22/2020 02/26/2020 04/16/2019 02/08/2018  PHQ - 2 Score 0 - 0 0 0 0 -  PHQ- 9 Score - - - 0 0 0 -  Exception Documentation - Patient refusal - - - - Patient  refusal    Fall Risk Fall Risk  12/06/2020 11/13/2020 10/19/2020 08/27/2020 07/22/2020  Falls in the past year? 0 0 0 0 0  Comment - - - - -  Number falls in past yr: 0 0 0 0 0  Injury with Fall? 0 0 0 0 0  Risk for fall due to : No Fall Risks Medication side effect - Medication side effect -  Follow up Falls evaluation completed Falls evaluation completed;Education provided - Education provided;Falls evaluation completed -    FALL RISK PREVENTION PERTAINING TO THE HOME:  Any stairs in or around the home? No  If so, are there any without handrails?  N/A Home free of loose throw rugs in walkways, pet beds, electrical cords, etc? Yes  Adequate lighting in your home to reduce risk of falls? Yes   ASSISTIVE DEVICES UTILIZED TO PREVENT FALLS:  Life alert? No  Use of a cane, walker or w/c? Yes  Grab bars in the bathroom? Yes  Shower chair or bench in shower? No  Elevated toilet seat or a handicapped toilet? Yes   TIMED UP AND GO:  Was the test performed?  N/A .  Length of time to ambulate 10 feet: N/A sec.     Cognitive Function:     6CIT Screen 12/06/2020  What Year? 0 points  What month? 0 points  What time? 0 points  Count back from 20 0 points  Months in reverse 0 points  Repeat phrase 0 points  Total Score 0    Immunizations Immunization History  Administered Date(s) Administered   Influenza,inj,Quad PF,6+ Mos 02/26/2020   Moderna Sars-Covid-2 Vaccination 01/13/2020   Pneumococcal Conjugate-13 09/13/2016   Pneumococcal Polysaccharide-23 08/15/2012, 01/14/2019   Tdap 06/06/2016  TDAP status: Up to date  Flu Vaccine status: Due, Education has been provided regarding the importance of this vaccine. Advised may receive this vaccine at local pharmacy or Health Dept. Aware to provide a copy of the vaccination record if obtained from local pharmacy or Health Dept. Verbalized acceptance and understanding.  Covid-19 vaccine status: Information provided on how to obtain  vaccines.   Qualifies for Shingles Vaccine? Yes   Zostavax completed No   Shingrix Completed?: No.    Education has been provided regarding the importance of this vaccine. Patient has been advised to call insurance company to determine out of pocket expense if they have not yet received this vaccine. Advised may also receive vaccine at local pharmacy or Health Dept. Verbalized acceptance and understanding.  Screening Tests Health Maintenance  Topic Date Due   Zoster Vaccines- Shingrix (1 of 2) Never done   COVID-19 Vaccine (2 - Moderna risk series) 02/10/2020   INFLUENZA VACCINE  10/19/2020   OPHTHALMOLOGY EXAM  01/27/2021   HEMOGLOBIN A1C  04/21/2021   FOOT EXAM  10/19/2021   COLONOSCOPY (Pts 45-71yr Insurance coverage will need to be confirmed)  02/19/2026   TETANUS/TDAP  06/07/2026   Hepatitis C Screening  Completed   HPV VACCINES  Aged Out    Health Maintenance  Health Maintenance Due  Topic Date Due   Zoster Vaccines- Shingrix (1 of 2) Never done   COVID-19 Vaccine (2 - Moderna risk series) 02/10/2020   INFLUENZA VACCINE  10/19/2020    Colorectal cancer screening: Type of screening: Colonoscopy. Completed 02/20/2019. Repeat every 7 years  Lung Cancer Screening: (Low Dose CT Chest recommended if Age 70-80years, 30 pack-year currently smoking OR have quit w/in 15years.) does not qualify.   Lung Cancer Screening Referral: No  Additional Screening:  Hepatitis C Screening: does qualify; Completed 06/06/2016  Vision Screening: Recommended annual ophthalmology exams for early detection of glaucoma and other disorders of the eye. Is the patient up to date with their annual eye exam?  Yes  Who is the provider or what is the name of the office in which the patient attends annual eye exams[facility] a provider, would they like to be referred to a provider to establish care? No .   Dental Screening: Recommended annual dental exams for  proper oral hygiene  Community Resource Referral / Chronic Care Management: CRR required this visit?  No   CCM required this visit?  No      Plan:     I have personally reviewed and noted the following in the patient's chart:   Medical and social history Use of alcohol, tobacco or illicit drugs  Current medications and supplements including opioid prescriptions. Patient is not currently taking opioid prescriptions. Functional ability and status Nutritional status Physical activity Advanced directives List of other physicians Hospitalizations, surgeries, and ER visits in previous 12 months Vitals Screenings to include cognitive, depression, and falls Referrals and appointments  In addition, I have reviewed and discussed with patient certain preventive protocols, quality metrics, and best practice recommendations. A written personalized care plan for preventive services as well as general preventive health recommendations were provided to patient.     GDrenda Freeze [facility]  12/06/2020   Nurse Notes: Non-Face to Face 60 minute visit Encounter.  Caleb [name], Thank you for taking time to come for your Medicare Wellness Visit. I appreciate your ongoing commitment to your health goals. Please review the following plan  we discussed and let me know if I can assist you in the future.   These are the goals we discussed:  Goals       (THN)Set My Target A1C-Diabetes Type 2 (pt-stated)      Timeframe:  Long-Range Goal Priority:  High Start Date:  08/27/2020                           Expected End Date:  03/19/2021                     Follow Up Date Nov2022  Barriers: Health Behaviors Knowledge    - set target A1C-lowering of A1C level from 7.6    Why is this important?   Your target A1C is decided together by you and your doctor.  It is based on several things like your age and other health issues.    Notes:  08/27/20 Patient has freestyle Libre meter. Blood sugar this morning  was 90-fasting. He reports cbgs normally range in the 70s to low 100s. Last A1C on file 7.69(May 2022).   11/13/20-Patient had recent PCP visit with lab work done. He reports A1C level has slightly increased to 8.2. He has been started on Jardiance daily. Patient reports he has noticed a difference in cbgs since starting meds. Blood sugars ranging more in the low to mid 100's. Blood sugar reported today as 136-non fasting. Patient states he is being more diligent with making better food choices as well.       Blood Pressure < 140/90      HEMOGLOBIN A1C < 7.0        This is a list of the screening recommended for you and due dates:  Health Maintenance  Topic Date Due   Zoster (Shingles) Vaccine (1 of 2) Never done   COVID-19 Vaccine (2 - Moderna risk series) 02/10/2020   Flu Shot  10/19/2020   Eye exam for diabetics  01/27/2021   Hemoglobin A1C  04/21/2021   Complete foot exam   10/19/2021   Colon Cancer Screening  02/19/2026   Tetanus Vaccine  06/07/2026   Hepatitis C Screening: USPSTF Recommendation to screen - Ages 18-79 yo.  Completed   HPV Vaccine  Aged Out

## 2020-12-06 NOTE — Patient Instructions (Signed)
Health Maintenance, Male Adopting a healthy lifestyle and getting preventive care are important in promoting health and wellness. Ask your health care provider about: The right schedule for you to have regular tests and exams. Things you can do on your own to prevent diseases and keep yourself healthy. What should I know about diet, weight, and exercise? Eat a healthy diet  Eat a diet that includes plenty of vegetables, fruits, low-fat dairy products, and lean protein. Do not eat a lot of foods that are high in solid fats, added sugars, or sodium. Maintain a healthy weight Body mass index (BMI) is a measurement that can be used to identify possible weight problems. It estimates body fat based on height and weight. Your health care provider can help determine your BMI and help you achieve or maintain a healthy weight. Get regular exercise Get regular exercise. This is one of the most important things you can do for your health. Most adults should: Exercise for at least 150 minutes each week. The exercise should increase your heart rate and make you sweat (moderate-intensity exercise). Do strengthening exercises at least twice a week. This is in addition to the moderate-intensity exercise. Spend less time sitting. Even light physical activity can be beneficial. Watch cholesterol and blood lipids Have your blood tested for lipids and cholesterol at 70 years of age, then have this test every 5 years. You may need to have your cholesterol levels checked more often if: Your lipid or cholesterol levels are high. You are older than 70 years of age. You are at high risk for heart disease. What should I know about cancer screening? Many types of cancers can be detected early and may often be prevented. Depending on your health history and family history, you may need to have cancer screening at various ages. This may include screening for: Colorectal cancer. Prostate cancer. Skin cancer. Lung  cancer. What should I know about heart disease, diabetes, and high blood pressure? Blood pressure and heart disease High blood pressure causes heart disease and increases the risk of stroke. This is more likely to develop in people who have high blood pressure readings, are of African descent, or are overweight. Talk with your health care provider about your target blood pressure readings. Have your blood pressure checked: Every 3-5 years if you are 18-39 years of age. Every year if you are 40 years old or older. If you are between the ages of 65 and 75 and are a current or former smoker, ask your health care provider if you should have a one-time screening for abdominal aortic aneurysm (AAA). Diabetes Have regular diabetes screenings. This checks your fasting blood sugar level. Have the screening done: Once every three years after age 45 if you are at a normal weight and have a low risk for diabetes. More often and at a younger age if you are overweight or have a high risk for diabetes. What should I know about preventing infection? Hepatitis B If you have a higher risk for hepatitis B, you should be screened for this virus. Talk with your health care provider to find out if you are at risk for hepatitis B infection. Hepatitis C Blood testing is recommended for: Everyone born from 1945 through 1965. Anyone with known risk factors for hepatitis C. Sexually transmitted infections (STIs) You should be screened each year for STIs, including gonorrhea and chlamydia, if: You are sexually active and are younger than 70 years of age. You are older than 70 years   of age and your health care provider tells you that you are at risk for this type of infection. Your sexual activity has changed since you were last screened, and you are at increased risk for chlamydia or gonorrhea. Ask your health care provider if you are at risk. Ask your health care provider about whether you are at high risk for HIV.  Your health care provider may recommend a prescription medicine to help prevent HIV infection. If you choose to take medicine to prevent HIV, you should first get tested for HIV. You should then be tested every 3 months for as long as you are taking the medicine. Follow these instructions at home: Lifestyle Do not use any products that contain nicotine or tobacco, such as cigarettes, e-cigarettes, and chewing tobacco. If you need help quitting, ask your health care provider. Do not use street drugs. Do not share needles. Ask your health care provider for help if you need support or information about quitting drugs. Alcohol use Do not drink alcohol if your health care provider tells you not to drink. If you drink alcohol: Limit how much you have to 0-2 drinks a day. Be aware of how much alcohol is in your drink. In the [place] one drink equals one 12 oz bottle of beer (355 mL), one 5 oz glass of wine (148 mL), or one 1 oz glass of hard liquor (44 mL). General instructions Schedule regular health, dental, and eye exams. Stay current with your vaccines. Tell your health care provider if: You often feel depressed. You have ever been abused or do not feel safe at home. Summary Adopting a healthy lifestyle and getting preventive care are important in promoting health and wellness. Follow your health care provider's instructions about healthy diet, exercising, and getting tested or screened for diseases. Follow your health care provider's instructions on monitoring your cholesterol and blood pressure. This information is not intended to replace advice given to you by your health care provider. Make sure you discuss any questions you have with your health care provider. Document Revised: 05/15/2020 Document Reviewed: 02/28/2018 Elsevier Patient Education  2022 [employer]  

## 2020-12-09 ENCOUNTER — Ambulatory Visit: Payer: Medicare HMO | Admitting: Pharmacist

## 2020-12-15 ENCOUNTER — Other Ambulatory Visit: Payer: Self-pay

## 2020-12-15 ENCOUNTER — Encounter: Payer: Self-pay | Admitting: Cardiovascular Disease

## 2020-12-15 ENCOUNTER — Ambulatory Visit (INDEPENDENT_AMBULATORY_CARE_PROVIDER_SITE_OTHER): Payer: Medicare HMO | Admitting: Cardiovascular Disease

## 2020-12-15 VITALS — BP 128/84 | HR 68 | Ht 67.0 in | Wt 174.0 lb

## 2020-12-15 DIAGNOSIS — I10 Essential (primary) hypertension: Secondary | ICD-10-CM | POA: Diagnosis not present

## 2020-12-15 DIAGNOSIS — I70229 Atherosclerosis of native arteries of extremities with rest pain, unspecified extremity: Secondary | ICD-10-CM

## 2020-12-15 DIAGNOSIS — I350 Nonrheumatic aortic (valve) stenosis: Secondary | ICD-10-CM | POA: Diagnosis not present

## 2020-12-15 DIAGNOSIS — E782 Mixed hyperlipidemia: Secondary | ICD-10-CM

## 2020-12-15 DIAGNOSIS — Z9889 Other specified postprocedural states: Secondary | ICD-10-CM | POA: Diagnosis not present

## 2020-12-15 NOTE — Assessment & Plan Note (Signed)
History of critical limb ischemia status post right SFA and popliteal intervention by myself 08/08/2019 with ultimately healing of his right foot wounds.  I performed staged left SFA intervention 11/18/2019 with recanalization of a left above-the-knee popliteal CTO with a Tiger stent.  He had a patent anterior tibial to the foot.  He ultimately had 2 toes amputated by Dr. Posey Pronto 11/22/2019.  His most recent Doppler studies performed 06/03/2020 revealed a right ABI of 1.13 with patent SFA and popliteal left ABI of 0.58 with an occluded left popliteal stent.  He however denies claudication.  He is fairly active.  He walks a treadmill.  We will continue to follow this noninvasively.

## 2020-12-15 NOTE — Patient Instructions (Signed)
Medication Instructions:  Your physician recommends that you continue on your current medications as directed. Please refer to the Current Medication list given to you today.  *If you need a refill on your cardiac medications before your next appointment, please call your pharmacy*   Testing/Procedures: Your physician has requested that you have an echocardiogram. Echocardiography is a painless test that uses sound waves to create images of your heart. It provides your doctor with information about the size and shape of your heart and how well your heart's chambers and valves are working. This procedure takes approximately one hour. There are no restrictions for this procedure. To be done now and again in 12 months. This procedure is done at [address] physician has requested that you have a lower extremity arterial duplex. This test is an ultrasound of the arteries in the legs. It looks at arterial blood flow in the legs. Allow one hour for Lower Arterial scans. There are no restrictions or special instructions. This procedure is done at [place].    Follow-Up: At [facility], you and your health needs are our priority.  As part of our continuing mission to provide you with exceptional heart care, we have created designated Provider Care Teams.  These Care Teams include your primary Cardiologist (physician) and Advanced Practice Providers (APPs -  Physician Assistants and Nurse Practitioners) who all work together to provide you with the care you need, when you need it.  We recommend signing up for the patient portal called "MyChart".  Sign up information is provided on this After Visit Summary.  MyChart is used to connect with patients for Virtual Visits (Telemedicine).  Patients are able to view lab/test results, encounter notes, upcoming appointments, etc.  Non-urgent messages can be sent to your provider as well.   To learn more about what you can do with MyChart, go  to [email or web address].    Your next appointment:   12 month(s)  The format for your next appointment:   In Person  Provider:   Quay Burow, MD

## 2020-12-15 NOTE — Assessment & Plan Note (Signed)
History of essential hypertension a blood pressure measured today 128/84.  He is on amlodipine, lisinopril, hydrochlorothiazide and metoprolol.

## 2020-12-15 NOTE — Assessment & Plan Note (Signed)
History of hyperlipidemia on statin therapy with lipid profile performed 06/05/2020 revealing total cholesterol of 106, LDL of 29 and HDL 53.

## 2020-12-15 NOTE — Assessment & Plan Note (Signed)
She history of moderate to severe aortic stenosis with his most recent 2D echo performed 08/27/2019 revealing EF of 40 to 45% with an aortic area of 1.03 cm and it peak gradient of 22 mmHg.  He is however asymptomatic.  We will continue to follow this noninvasively.

## 2020-12-15 NOTE — Progress Notes (Signed)
12/15/2020 Caleb [name]   Dec 23, 1950  [id number]  Primary Physician [name], MD Primary Cardiologist: Lorretta Harp MD [name], [place], [place]  HPI:  Caleb [name] is a 69 y.o.  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 Adah Perl, his podiatrist for evaluation of critical limb ischemia.  I last saw him in the office 06/05/2020. His wife of 36 years did die of ovarian cancer 3 years ago but he is very positive about meeting somebody new in his life.  He is very active and swims 4 days a week, walks 3 to 4 days a week and belongs to fitness.  He denies chest pain or shortness of breath.  His cardiac risk factors are notable for treated hypertension, diabetes and hyperlipidemia.  His father did die of a myocardial infarction age 49.  He is never had a heart attack or stroke.  He does complain of lifestyle and claudication however.  He developed wounds on his toes after walking 5 miles about [facility] with new shoes and has been treated by Dr. Adah Perl since that time with slow progression of wound healing.   I performed peripheral angiography on him 08/08/2019 and performed atherectomy and drug-coated balloon angioplasty of high-grade right SFA and popliteal stenosis with two-vessel runoff.  This resulted in an improvement in his claudication symptoms, healing of his right foot wounds and improvement in his Doppler studies.  He did have an occluded left popliteal artery which I will address in a staged fashion in the near future although his wounds on his left foot are slowly healing.  I also performed carotid Doppler studies that showed high-grade right greater than left ICA stenosis and have referred him to vascular surgery for consideration of endarterectomy.  He denies chest pain or shortness of breath.  He does have mild aortic stenosis by 2D echo 2  years ago and a high-pitched outflow tract murmur.  We check a 2D echo on 08/27/2019 that showed a decline in his EF from 50 to 55% down to 40 to 45% with moderate to severe aortic stenosis.  A Myoview stress test performed 08/30/2019 showed inferior scar without ischemia.  He really denies chest pain or shortness of breath.   He is referred back by Dr. Oneida Alar because of progressive gangrene in his left fourth toe.  His angiogram which I performed 08/08/2019 did show an occluded left popliteal artery with occluded posterior tibial and peroneal and high-grade AT disease.  Because of critical limb ischemia we decided to proceed with early intervention of his left popliteal CTO.   I performed left popliteal CTO intervention on 11/18/2019 placing a 5 mm x 60 mm long Tigris self-expanding stent.  He had one-vessel runoff via a large anterior tibial artery.  He was discharged home the following day.  His follow-up Dopplers performed 11/28/2019 revealed an increase in his left ABI from 0.76 up to 1.02.  He did have his left fourth and fifth toes amputated by Dr. Posey Pronto on 11/22/2019 and these are healing nicely.  He no longer has claudication.   He has undergone bilateral staged TCAR by Dr. Oneida Alar.  Lower extremity Dopplers performed 06/03/2020 revealed a right ABI was 1.13 and a left of 0.58 with an occluded left popliteal artery stent.  He is very active, and denies claudication.  He denies chest pain or shortness of breath.  Current Meds  Medication Sig   amLODipine (NORVASC) 5 MG tablet Take 1 tablet by mouth daily.   aspirin EC 81 MG tablet Take 81 mg by mouth daily.   atorvastatin (LIPITOR) 40 MG tablet Take by mouth.   Blood Glucose Monitoring Suppl (ONETOUCH VERIO) w/Device KIT 1 each by Does not apply route 2 (two) times daily. Use as instructed twice daily, check fasting sugar in morning and check before bedtime. E11.8   clopidogrel (PLAVIX) 75 MG tablet TAKE 1 TABLET (75 MG TOTAL) BY MOUTH DAILY.    Continuous Blood Gluc Receiver (FREESTYLE LIBRE 14 DAY READER) DEVI 1 each by Does not apply route See admin instructions. Use as directed to check blood glucose   Continuous Blood Gluc Sensor (FREESTYLE LIBRE 14 DAY SENSOR) MISC INJECT IN THE SKIN EVERY 14 (FOURTEEN) DAYS. E11.69 Z79.4   dapagliflozin propanediol (FARXIGA) 10 MG TABS tablet Take 1 tablet (10 mg total) by mouth daily before breakfast.   empagliflozin (JARDIANCE) 25 MG TABS tablet Take 25 mg by mouth daily. Pt states only taking 1/2 tab per day   gabapentin (NEURONTIN) 800 MG tablet Take 1 tablet by mouth 2 (two) times daily.   glucose blood test strip Use as instructed twice daily, check fasting sugar in morning and check before bedtime. E11.8   insulin NPH-regular Human (NOVOLIN 70/30) (70-30) 100 UNIT/ML injection Inject 12 Units into the skin 2 (two) times daily with a meal.   Insulin Syringe-Needle U-100 (TRUEPLUS INSULIN SYRINGE) 31G X 5/16" 0.3 ML MISC Use as instructed to inject insulin daily.   Insulin Syringe-Needle U-100 30G X 5/16" 0.3 ML MISC use as instructed to inject insulin daily   lisinopril-hydrochlorothiazide (ZESTORETIC) 20-12.5 MG tablet TAKE 2 TABLETS EVERY DAY   metFORMIN (GLUCOPHAGE) 500 MG tablet Take 1 tablet by mouth 2 (two) times daily.   metoprolol tartrate (LOPRESSOR) 50 MG tablet Take 50 mg by mouth 2 (two) times daily.   mupirocin ointment (BACTROBAN) 2 % Apply topically.   ondansetron (ZOFRAN) 4 MG tablet Take 1 tablet (4 mg total) by mouth every 8 (eight) hours as needed for nausea or vomiting.   ONETOUCH DELICA LANCETS 00T MISC Use as instructed twice daily, check fasting sugar in morning and check before bedtime. E11.8   rosuvastatin (CRESTOR) 40 MG tablet Take 1 tablet (40 mg total) by mouth daily. To lower cholesterol   silver sulfADIAZINE (SILVADENE) 1 % cream Apply to affected toes once daily and cover with dressing.   [DISCONTINUED] metoprolol tartrate (LOPRESSOR) 50 MG tablet Take by mouth.    Current Facility-Administered Medications for the 12/15/20 encounter (Office Visit) with Lorretta Harp, MD  Medication   sodium chloride flush (NS) 0.9 % injection 3 mL   sodium chloride flush (NS) 0.9 % injection 3 mL     Allergies  Allergen Reactions   Percocet [Oxycodone-Acetaminophen] Nausea And Vomiting    10/325. Pt has 3 doses of this medication. N/V, temperature 97, no chills and slight sweating. Pt dressing was C/D/I no drainage noted.     Social History   Socioeconomic History   Marital status: Widowed    Spouse name: Not on file   Number of children: Not on file   Years of education: Not on file   Highest education level: Not on file  Occupational History   Occupation: Caregiver    Comment: Special needs  Tobacco Use   Smoking status: Never   Smokeless tobacco: Never  Vaping Use   Vaping Use: Never  used  Substance and Sexual Activity   Alcohol use: Not Currently    Comment: rare   Drug use: No   Sexual activity: Not Currently  Other Topics Concern   Not on file  Social History Narrative   Widower.  3 daughters and 3 grandchildren.     Social Determinants of Health   Financial Resource Strain: Low Risk    Difficulty of Paying Living Expenses: Not hard at all  Food Insecurity: Food Insecurity Present   Worried About Charity fundraiser in the Last Year: Never true   Arboriculturist in the Last Year: Sometimes true  Transportation Needs: No Transportation Needs   Lack of Transportation (Medical): No   Lack of Transportation (Non-Medical): No  Physical Activity: Sufficiently Active   Days of Exercise per Week: 4 days   Minutes of Exercise per Session: 60 min  Stress: No Stress Concern Present   Feeling of Stress : Not at all  Social Connections: Moderately Isolated   Frequency of Communication with Friends and Family: More than three times a week   Frequency of Social Gatherings with Friends and Family: More than three times a week   Attends  Religious Services: More than 4 times per year   Active Member of [employer] or Organizations: No   Attends Archivist Meetings: Never   Marital Status: Widowed  Human resources officer Violence: Not At Risk   Fear of Current or Ex-Partner: No   Emotionally Abused: No   Physically Abused: No   Sexually Abused: No     Review of Systems: General: negative for chills, fever, night sweats or weight changes.  Cardiovascular: negative for chest pain, dyspnea on exertion, edema, orthopnea, palpitations, paroxysmal nocturnal dyspnea or shortness of breath Dermatological: negative for rash Respiratory: negative for cough or wheezing Urologic: negative for hematuria Abdominal: negative for nausea, vomiting, diarrhea, bright red blood per rectum, melena, or hematemesis Neurologic: negative for visual changes, syncope, or dizziness All other systems reviewed and are otherwise negative except as noted above.    Blood pressure 128/84, pulse 68, height '5\' 7"'  (1.702 m), weight 174 lb (78.9 kg), SpO2 96 %.  General appearance: alert Neck: no adenopathy, no JVD, supple, symmetrical, trachea midline, thyroid not enlarged, symmetric, no tenderness/mass/nodules, and soft bilateral carotid bruits Lungs: clear to auscultation bilaterally Heart: 2/6 outflow tract murmur consistent with aortic stenosis. Extremities: extremities normal, atraumatic, no cyanosis or edema Pulses: 2+ and symmetric Skin: Skin color, texture, turgor normal. No rashes or lesions Neurologic: Grossly normal  EKG sinus rhythm at 69 with voltage criteria for LVH.  Personally reviewed this EKG.  ASSESSMENT AND PLAN:   Hypertension History of essential hypertension a blood pressure measured today 128/84.  He is on amlodipine, lisinopril, hydrochlorothiazide and metoprolol.  Hyperlipidemia History of hyperlipidemia on statin therapy with lipid profile performed 06/05/2020 revealing total cholesterol of 106, LDL of 29 and HDL  53.  Critical limb ischemia with history of revascularization of same extremity History of critical limb ischemia status post right SFA and popliteal intervention by myself 08/08/2019 with ultimately healing of his right foot wounds.  I performed staged left SFA intervention 11/18/2019 with recanalization of a left above-the-knee popliteal CTO with a Tiger stent.  He had a patent anterior tibial to the foot.  He ultimately had 2 toes amputated by Dr. Posey Pronto 11/22/2019.  His most recent Doppler studies performed 06/03/2020 revealed a right ABI of 1.13 with patent SFA and popliteal left ABI of 0.58  with an occluded left popliteal stent.  He however denies claudication.  He is fairly active.  He walks a treadmill.  We will continue to follow this noninvasively.  Moderate aortic stenosis She history of moderate to severe aortic stenosis with his most recent 2D echo performed 08/27/2019 revealing EF of 40 to 45% with an aortic area of 1.03 cm and it peak gradient of 22 mmHg.  He is however asymptomatic.  We will continue to follow this noninvasively.     Lorretta Harp MD FACP,FACC,FAHA, [facility] 12/15/2020 9:34 AM

## 2020-12-16 ENCOUNTER — Encounter: Payer: Self-pay | Admitting: Podiatry

## 2020-12-16 ENCOUNTER — Ambulatory Visit (INDEPENDENT_AMBULATORY_CARE_PROVIDER_SITE_OTHER): Payer: Medicare HMO | Admitting: Podiatry

## 2020-12-16 DIAGNOSIS — B351 Tinea unguium: Secondary | ICD-10-CM | POA: Diagnosis not present

## 2020-12-16 DIAGNOSIS — Z89422 Acquired absence of other left toe(s): Secondary | ICD-10-CM

## 2020-12-16 DIAGNOSIS — E1151 Type 2 diabetes mellitus with diabetic peripheral angiopathy without gangrene: Secondary | ICD-10-CM

## 2020-12-16 DIAGNOSIS — L84 Corns and callosities: Secondary | ICD-10-CM

## 2020-12-18 ENCOUNTER — Encounter: Payer: Self-pay | Admitting: Podiatry

## 2020-12-18 DIAGNOSIS — F5221 Male erectile disorder: Secondary | ICD-10-CM | POA: Insufficient documentation

## 2020-12-18 DIAGNOSIS — Z302 Encounter for sterilization: Secondary | ICD-10-CM | POA: Insufficient documentation

## 2020-12-18 NOTE — Progress Notes (Signed)
  Subjective:  Patient ID: Caleb [name], male    DOB: 1950-10-24,  MRN: [id number]  70 y.o. male presents with at risk foot care. Patient has h/o amputation of digital amputation L 4th toe and L 5th toe and thick, elongated toenails 1-5 right, L hallux, L 2nd toe, and L 3rd toe which are tender when wearing enclosed shoe gear.Marland Kitchen   PCP: [name], MD and last visit was: 10/19/2020.  He voices no new pedal problems on today's visit.  Review of Systems: Negative except as noted in the HPI.   Allergies  Allergen Reactions   Percocet [Oxycodone-Acetaminophen] Nausea And Vomiting    10/325. Pt has 3 doses of this medication. N/V, temperature 97, no chills and slight sweating. Pt dressing was C/D/I no drainage noted.     Objective:  There were no vitals filed for this visit. Constitutional Patient is a pleasant 70 y.o. African American male WD, WN in NAD. AAO x 3.  Vascular Capillary refill time to remaining digits <3 seconds. Nonpalpable DP pulse(s) b/l lower extremities. Nonpalpable PT pulse(s) b/l lower extremities. Pedal hair absent. Lower extremity skin temperature gradient within normal limits. No pain with calf compression b/l. No edema noted b/l lower extremities. No cyanosis or clubbing noted.  Neurologic Normal speech. Protective sensation intact 5/5 intact bilaterally with 10g monofilament b/l. Vibratory sensation intact b/l.  Dermatologic Pedal skin with normal turgor, texture and tone b/l lower extremities No open wounds b/l lower extremities No interdigital macerations b/l lower extremities Toenails 1-5 right, L hallux, and L 3rd toe well maintained with adequate length. No erythema, no edema, no drainage, no fluctuance. Anonychia noted L 2nd toe. Nailbed(s) epithelialized.  Hyperkeratotic lesion(s) nailbed left 2nd toe.  No erythema, no edema, no drainage, no fluctuance.  Orthopedic: Normal muscle strength 5/5 to all lower extremity muscle groups bilaterally. No pain crepitus or  joint limitation noted with ROM b/l. Lower extremity amputation(s): digital amputation L 4th toe and L 5th toe.   Hemoglobin A1C Latest Ref Rng & Units 10/19/2020 07/22/2020 02/03/2020  HGBA1C 0.0 - 7.0 % 8.2(A) 7.6(A) 7.7(H)  Some recent data might be hidden    Assessment:   1. Onychomycosis   2. Callus   3. Status post amputation of lesser toe of left foot ([place])   4. Type II diabetes mellitus with peripheral circulatory disorder (HCC)     Plan:  -No new findings. No new orders. -Continue diabetic foot care principles: inspect feet daily, monitor glucose as recommended by PCP and/or Endocrinologist, and follow prescribed diet per PCP, Endocrinologist and/or dietician. -Patient to continue soft, supportive shoe gear daily. -Toenails 1-5 right, L hallux, and L 3rd toe debrided in length and girth without iatrogenic bleeding with sterile nail nipper and dremel.  -Callus(es) L 2nd toe pared utilizing sterile scalpel blade without complication or incident. Total number debrided =1. -Patient to report any pedal injuries to medical professional immediately. -Patient/POA to call should there be question/concern in the interim.  Return in about 3 months (around 03/17/2021).  Marzetta Board, DPM

## 2020-12-21 ENCOUNTER — Ambulatory Visit (INDEPENDENT_AMBULATORY_CARE_PROVIDER_SITE_OTHER): Payer: Medicare HMO

## 2020-12-21 ENCOUNTER — Other Ambulatory Visit: Payer: Self-pay

## 2020-12-21 DIAGNOSIS — I350 Nonrheumatic aortic (valve) stenosis: Secondary | ICD-10-CM | POA: Diagnosis not present

## 2020-12-21 LAB — ECHOCARDIOGRAM COMPLETE
AR max vel: 1.08 cm2
AV Area VTI: 1.13 cm2
AV Area mean vel: 1.05 cm2
AV Mean grad: 17 mmHg
AV Peak grad: 30.9 mmHg
Ao pk vel: 2.78 m/s
Area-P 1/2: 2.86 cm2
Calc EF: 58.2 %
MV VTI: 1.76 cm2
S' Lateral: 3.31 cm
Single Plane A2C EF: 60 %
Single Plane A4C EF: 61.7 %

## 2020-12-23 ENCOUNTER — Other Ambulatory Visit (HOSPITAL_BASED_OUTPATIENT_CLINIC_OR_DEPARTMENT_OTHER): Payer: Self-pay

## 2020-12-28 ENCOUNTER — Other Ambulatory Visit (HOSPITAL_BASED_OUTPATIENT_CLINIC_OR_DEPARTMENT_OTHER): Payer: Self-pay

## 2020-12-28 ENCOUNTER — Other Ambulatory Visit (HOSPITAL_COMMUNITY): Payer: Self-pay

## 2020-12-31 ENCOUNTER — Ambulatory Visit (INDEPENDENT_AMBULATORY_CARE_PROVIDER_SITE_OTHER): Payer: Medicare HMO | Admitting: Physician Assistant

## 2020-12-31 ENCOUNTER — Ambulatory Visit (HOSPITAL_COMMUNITY)
Admission: RE | Admit: 2020-12-31 | Discharge: 2020-12-31 | Disposition: A | Discharge status: Home / Self Care | Payer: Medicare HMO | Source: Ambulatory Visit | Attending: Vascular Surgery | Admitting: Vascular Surgery

## 2020-12-31 ENCOUNTER — Encounter: Payer: Self-pay | Admitting: Physician Assistant

## 2020-12-31 ENCOUNTER — Other Ambulatory Visit: Payer: Self-pay

## 2020-12-31 VITALS — BP 130/74 | HR 74 | Temp 97.5°F | Resp 16 | Ht 67.0 in | Wt 178.0 lb

## 2020-12-31 DIAGNOSIS — I6523 Occlusion and stenosis of bilateral carotid arteries: Secondary | ICD-10-CM | POA: Diagnosis not present

## 2020-12-31 DIAGNOSIS — T23199A Burn of first degree of multiple sites of unspecified wrist and hand, initial encounter: Secondary | ICD-10-CM | POA: Insufficient documentation

## 2020-12-31 NOTE — Progress Notes (Signed)
HISTORY AND PHYSICAL     CC:  follow up. Requesting Provider:  [name], MD  HPI: This is a 70 y.o. male here for follow up for carotid artery stenosis.  Pt is s/p left TCAR f 12/16/2019 by Dr. Oneida Alar and subsequent right TCAR on 02/03/2020 also by Dr. Oneida Alar both done for asymptomatic lesions but done due to high risk from aortic stenosis with decreased EF.  He does have known PAD that is followed by Dr. Gwenlyn Found.   Pt was last seen December 2021 by Dr. Oneida Alar and at that time he was not having any trouble swallowing or incisions drainage.  His neuro exam was in tact.  His duplex revealed his stents were widely patent.   Pt returns today for follow up.    Pt denies any amaurosis fugax, speech difficulties, weakness, numbness, paralysis or clumsiness or facial droop.  He states that his legs are doing well.  He does not have any claudication or non healing wounds.  He does have neuropathy in his hands and feet.  He walks a couple of miles per day that takes him about 40 minutes.    The pt is on a statin for cholesterol management.  The pt is on a daily aspirin.   Other AC:  Plavix The pt is on CCB, BB for hypertension.   The pt is diabetic.   Tobacco hx:  never  Pt does not have family hx of AAA.  Past Medical History:  Diagnosis Date   Aortic stenosis    Carotid artery occlusion    Diabetes mellitus without complication ([place])    Dyslipidemia 09/27/2012   Erectile dysfunction 09/27/2012   Heart murmur    Hypercholesteremia    Hyperlipidemia 08/12/2012   Hypertension    Hyponatremia 08/14/2012   Neuropathy    Pancreatitis    Pancreatitis, acute 09/27/2012   Peripheral vascular disease ([place])    Vitamin D deficiency 06/23/2015    Past Surgical History:  Procedure Laterality Date   ABDOMINAL AORTOGRAM W/LOWER EXTREMITY N/A 08/08/2019   Procedure: ABDOMINAL AORTOGRAM W/LOWER EXTREMITY;  Surgeon: Lorretta Harp, MD;  Location: [facility];  Service: Cardiovascular;   Laterality: N/A;   ABDOMINAL AORTOGRAM W/LOWER EXTREMITY N/A 11/18/2019   Procedure: ABDOMINAL AORTOGRAM W/LOWER EXTREMITY;  Surgeon: Lorretta Harp, MD;  Location: [facility];  Service: Cardiovascular;  Laterality: N/A;   COLONOSCOPY  12 years ago   in [place] clinic= normal exam per pt   KNEE SURGERY     PERIPHERAL VASCULAR INTERVENTION Right 08/08/2019   Procedure: PERIPHERAL VASCULAR INTERVENTION;  Surgeon: Lorretta Harp, MD;  Location: [facility];  Service: Cardiovascular;  Laterality: Right;   PERIPHERAL VASCULAR INTERVENTION Left 11/18/2019   Procedure: PERIPHERAL VASCULAR INTERVENTION;  Surgeon: Lorretta Harp, MD;  Location: [facility];  Service: Cardiovascular;  Laterality: Left;  left popliteal artery   TRANSCAROTID ARTERY REVASCULARIZATION  Left 12/16/2019   Procedure: TRANSCAROTID ARTERY REVASCULARIZATION;  Surgeon: Elam Dutch, MD;  Location: [place];  Service: Vascular;  Laterality: Left;   TRANSCAROTID ARTERY REVASCULARIZATION  Right 02/03/2020   Procedure: RIGHT TRANSCAROTID ARTERY REVASCULARIZATION;  Surgeon: Elam Dutch, MD;  Location: [facility];  Service: Vascular;  Laterality: Right;   ULTRASOUND GUIDANCE FOR VASCULAR ACCESS Right 12/16/2019   Procedure: ULTRASOUND GUIDANCE FOR VASCULAR ACCESS;  Surgeon: Elam Dutch, MD;  Location: [place];  Service: Vascular;  Laterality: Right;   ULTRASOUND GUIDANCE FOR VASCULAR ACCESS Right 02/03/2020  Procedure: ULTRASOUND GUIDANCE FOR VASCULAR ACCESS;  Surgeon: Elam Dutch, MD;  Location: [place];  Service: Vascular;  Laterality: Right;    Allergies  Allergen Reactions   Percocet [Oxycodone-Acetaminophen] Nausea And Vomiting    10/325. Pt has 3 doses of this medication. N/V, temperature 97, no chills and slight sweating. Pt dressing was C/D/I no drainage noted.     Current Outpatient Medications  Medication Sig Dispense Refill   amLODipine (NORVASC) 5 MG tablet Take 1 tablet by mouth  daily.     aspirin EC 81 MG tablet Take 81 mg by mouth daily.     atorvastatin (LIPITOR) 40 MG tablet Take by mouth.     Blood Glucose Monitoring Suppl (ONETOUCH VERIO) w/Device KIT 1 each by Does not apply route 2 (two) times daily. Use as instructed twice daily, check fasting sugar in morning and check before bedtime. E11.8 1 kit 0   clopidogrel (PLAVIX) 75 MG tablet TAKE 1 TABLET (75 MG TOTAL) BY MOUTH DAILY. 30 tablet 11   collagenase (SANTYL) ointment Apply 1 application topically daily. (Patient not taking: Reported on 12/15/2020) 15 g 0   collagenase (SANTYL) ointment Apply 1 application topically daily. Apply Santyl Ointment to scab on left 3rd and 4th digits. Measurements: Left 3rd toe 1.0 x 0.6 cm Left 4th toe: 2.0 x 1.0 cm (Patient not taking: Reported on 12/15/2020) 30 g 0   collagenase (SANTYL) ointment Apply 1 application topically daily. (Patient not taking: Reported on 12/15/2020) 90 each 0   Continuous Blood Gluc Receiver (FREESTYLE LIBRE 14 DAY READER) DEVI 1 each by Does not apply route See admin instructions. Use as directed to check blood glucose 1 each 0   Continuous Blood Gluc Sensor (FREESTYLE LIBRE 14 DAY SENSOR) MISC INJECT IN THE SKIN EVERY 14 (FOURTEEN) DAYS. E11.69 Z79.4 6 each 3   dapagliflozin propanediol (FARXIGA) 10 MG TABS tablet Take 1 tablet (10 mg total) by mouth daily before breakfast. 90 tablet 1   empagliflozin (JARDIANCE) 25 MG TABS tablet Take 25 mg by mouth daily. Pt states only taking 1/2 tab per day     gabapentin (NEURONTIN) 300 MG capsule TAKE 1 CAPSULE TWICE DAILY (Patient not taking: Reported on 12/15/2020) 180 capsule 0   gabapentin (NEURONTIN) 800 MG tablet Take 1 tablet by mouth 2 (two) times daily.     glucose blood test strip Use as instructed twice daily, check fasting sugar in morning and check before bedtime. E11.8 100 each 12   insulin aspart protamine- aspart (NOVOLOG MIX 70/30) (70-30) 100 UNIT/ML injection Inject into the skin. (Patient not  taking: Reported on 12/15/2020)     insulin NPH-regular Human (NOVOLIN 70/30) (70-30) 100 UNIT/ML injection Inject 12 Units into the skin 2 (two) times daily with a meal. 20 mL 6   Insulin Syringe-Needle U-100 (TRUEPLUS INSULIN SYRINGE) 31G X 5/16" 0.3 ML MISC Use as instructed to inject insulin daily. 100 each 2   lisinopril-hydrochlorothiazide (ZESTORETIC) 20-12.5 MG tablet TAKE 2 TABLETS EVERY DAY 180 tablet 0   Menthol-Methyl Salicylate (THERA-GESIC) 0.5-15 % CREA Apply topically. (Patient not taking: Reported on 12/15/2020)     metFORMIN (GLUCOPHAGE) 1000 MG tablet Take 1 tablet (1,000 mg total) by mouth 2 (two) times daily with a meal. (Patient not taking: Reported on 12/15/2020) 360 tablet 1   metFORMIN (GLUCOPHAGE) 500 MG tablet Take 1 tablet by mouth 2 (two) times daily.     methocarbamol (ROBAXIN) 750 MG tablet Take 1 tablet by mouth at bedtime. (Patient not  taking: Reported on 12/15/2020)     metoprolol tartrate (LOPRESSOR) 50 MG tablet Take 50 mg by mouth 2 (two) times daily.     MODERNA COVID-19 VACCINE 100 MCG/0.5ML injection  (Patient not taking: Reported on 12/15/2020)     mupirocin ointment (BACTROBAN) 2 % Apply topically.     ondansetron (ZOFRAN) 4 MG tablet Take 1 tablet (4 mg total) by mouth every 8 (eight) hours as needed for nausea or vomiting. 30 tablet 0   ONETOUCH DELICA LANCETS 07M MISC Use as instructed twice daily, check fasting sugar in morning and check before bedtime. E11.8 100 each 12   rosuvastatin (CRESTOR) 40 MG tablet Take 1 tablet (40 mg total) by mouth daily. To lower cholesterol 90 tablet 1   sildenafil (VIAGRA) 50 MG tablet TAKE 2 TABLETS DAILY AS NEEDED FOR ERECTILE DYSFUNCTION (Patient not taking: Reported on 12/15/2020) 18 tablet 0   silver sulfADIAZINE (SILVADENE) 1 % cream Apply to affected toes once daily and cover with dressing. 400 g 1   Current Facility-Administered Medications  Medication Dose Route Frequency Provider Last Rate Last Admin   sodium  chloride flush (NS) 0.9 % injection 3 mL  3 mL Intravenous Q12H Lorretta Harp, MD       sodium chloride flush (NS) 0.9 % injection 3 mL  3 mL Intravenous Q12H Lorretta Harp, MD        Family History  Problem Relation Age of Onset   CAD Father    Hypertension Father    Alcohol abuse Father        Cause of death   Diabetes Mother    Colon polyps Mother    CAD Brother 25       CABG   Colon cancer Neg Hx    Esophageal cancer Neg Hx    Rectal cancer Neg Hx    Stomach cancer Neg Hx     Social History   Socioeconomic History   Marital status: Widowed    Spouse name: Not on file   Number of children: Not on file   Years of education: Not on file   Highest education level: Not on file  Occupational History   Occupation: Caregiver    Comment: Special needs  Tobacco Use   Smoking status: Never   Smokeless tobacco: Never  Vaping Use   Vaping Use: Never used  Substance and Sexual Activity   Alcohol use: Not Currently    Comment: rare   Drug use: No   Sexual activity: Not Currently  Other Topics Concern   Not on file  Social History Narrative   Widower.  3 daughters and 3 grandchildren.     Social Determinants of Health   Financial Resource Strain: Low Risk    Difficulty of Paying Living Expenses: Not hard at all  Food Insecurity: Food Insecurity Present   Worried About Charity fundraiser in the Last Year: Never true   Arboriculturist in the Last Year: Sometimes true  Transportation Needs: No Transportation Needs   Lack of Transportation (Medical): No   Lack of Transportation (Non-Medical): No  Physical Activity: Sufficiently Active   Days of Exercise per Week: 4 days   Minutes of Exercise per Session: 60 min  Stress: No Stress Concern Present   Feeling of Stress : Not at all  Social Connections: Moderately Isolated   Frequency of Communication with Friends and Family: More than three times a week   Frequency of Social Gatherings with Friends  and Family: More  than three times a week   Attends Religious Services: More than 4 times per year   Active Member of Clubs or Organizations: No   Attends Archivist Meetings: Never   Marital Status: Widowed  Human resources officer Violence: Not At Risk   Fear of Current or Ex-Partner: No   Emotionally Abused: No   Physically Abused: No   Sexually Abused: No     REVIEW OF SYSTEMS:   '[X]'  denotes positive finding, '[ ]'  denotes negative finding Cardiac  Comments:  Chest pain or chest pressure:    Shortness of breath upon exertion:    Short of breath when lying flat:    Irregular heart rhythm:        Vascular    Pain in calf, thigh, or hip brought on by ambulation:    Pain in feet at night that wakes you up from your sleep:     Blood clot in your veins:    Leg swelling:         Pulmonary    Oxygen at home:    Productive cough:     Wheezing:         Neurologic    Sudden weakness in arms or legs:     Sudden numbness in arms or legs:     Sudden onset of difficulty speaking or slurred speech:    Temporary loss of vision in one eye:     Problems with dizziness:         Gastrointestinal    Blood in stool:     Vomited blood:         Genitourinary    Burning when urinating:     Blood in urine:        Psychiatric    Major depression:         Hematologic    Bleeding problems:    Problems with blood clotting too easily:        Skin    Rashes or ulcers:        Constitutional    Fever or chills:      PHYSICAL EXAMINATION:  Today's Vitals   12/31/20 1129 12/31/20 1132  BP: 128/77 130/74  Pulse: 74 74  Resp: 16   Temp: (!) 97.5 F (36.4 C)   TempSrc: Temporal   SpO2: 98%   Weight: 178 lb (80.7 kg)   Height: '5\' 7"'  (1.702 m)    Body mass index is 27.88 kg/m.   General:  WDWN in NAD; vital signs documented above Gait: Not observed HENT: WNL, normocephalic Pulmonary: normal non-labored breathing Cardiac: regular HR with murmur without carotid bruits Abdomen: soft, NT;  aortic pulse is not palpable Skin: without rashes Vascular Exam/Pulses:  Right Left  Radial 2+ (normal) 2+ (normal)  Femoral 2+ (normal) 2+ (normal)  Popliteal Unable to palpate Unable to palpate  DP Not palpable Not palpable  PT Not palpable Not palpable   Extremities: without ischemic changes, without Gangrene , without cellulitis; without open wounds Musculoskeletal: no muscle wasting or atrophy  Neurologic: A&O X 3; moving all extremities equally; speech is fluent/normal Psychiatric:  The pt has Normal affect.   Non-Invasive Vascular Imaging:   Carotid Duplex on 12/31/2020: Right Carotid: Patent stent with no significant stenosis noted.  Left Carotid: Patent stent with no significant stenosis noted.   Vertebrals:  Bilateral vertebral arteries demonstrate antegrade flow.  Subclavians: Normal flow hemodynamics were seen in bilateral subclavian arteries.  Previous Carotid duplex on  12/31/2020: Right Carotid: Right ICA stent within normal limits, no hemodynamically                 significant stenosis.  Left Carotid: Left ICA stent within normal limits, no hemodynamically                significant stenosis    ASSESSMENT/PLAN:: 70 y.o. male here for follow up carotid artery stenosis with hx of eft TCAR f 12/16/2019 by Dr. Oneida Alar and subsequent right TCAR on 02/03/2020 also by Dr. Oneida Alar both done for asymptomatic lesions but done due to high risk from aortic stenosis with decreased EF.  Carotid stenosis s/p bilateral TCAR -duplex today reveals bilateral carotid stents are patent without stenosis -discussed s/s of stroke with pt and he understands should he develop any of these sx, he will go to the nearest ER or call 911. -pt will f/u in one year with carotid duplex -pt will call sooner should they have any issues. -continue statin/asa/plavix  PAD -denies any wounds or claudication.  He walks regularly.  He does have neuropathy and discussed with him checking his feet  everyday.   -Dr. Gwenlyn Found to continue to monitor his PAD.    Leontine Locket, [facility] [phone]  Clinic MD:  [name] on call

## 2021-01-19 ENCOUNTER — Other Ambulatory Visit: Payer: Self-pay

## 2021-01-19 ENCOUNTER — Ambulatory Visit: Payer: Medicare HMO | Attending: Family Medicine | Admitting: Family Medicine

## 2021-01-19 ENCOUNTER — Encounter: Payer: Self-pay | Admitting: Family Medicine

## 2021-01-19 VITALS — BP 149/81 | HR 73 | Ht 67.0 in | Wt 178.2 lb

## 2021-01-19 DIAGNOSIS — I5042 Chronic combined systolic (congestive) and diastolic (congestive) heart failure: Secondary | ICD-10-CM

## 2021-01-19 DIAGNOSIS — E1151 Type 2 diabetes mellitus with diabetic peripheral angiopathy without gangrene: Secondary | ICD-10-CM

## 2021-01-19 DIAGNOSIS — Z794 Long term (current) use of insulin: Secondary | ICD-10-CM | POA: Diagnosis not present

## 2021-01-19 DIAGNOSIS — I152 Hypertension secondary to endocrine disorders: Secondary | ICD-10-CM | POA: Diagnosis not present

## 2021-01-19 DIAGNOSIS — I779 Disorder of arteries and arterioles, unspecified: Secondary | ICD-10-CM

## 2021-01-19 DIAGNOSIS — Z23 Encounter for immunization: Secondary | ICD-10-CM

## 2021-01-19 DIAGNOSIS — E1159 Type 2 diabetes mellitus with other circulatory complications: Secondary | ICD-10-CM

## 2021-01-19 DIAGNOSIS — I110 Hypertensive heart disease with heart failure: Secondary | ICD-10-CM | POA: Diagnosis not present

## 2021-01-19 LAB — POCT GLYCOSYLATED HEMOGLOBIN (HGB A1C): HbA1c, POC (controlled diabetic range): 7.7 % — AB (ref 0.0–7.0)

## 2021-01-19 LAB — GLUCOSE, POCT (MANUAL RESULT ENTRY): POC Glucose: 115 mg/dl — AB (ref 70–99)

## 2021-01-19 MED ORDER — CLOPIDOGREL BISULFATE 75 MG PO TABS: 75.0000 mg | ORAL_TABLET | Freq: Every day | ORAL | 1 refills | Status: AC

## 2021-01-19 NOTE — Patient Instructions (Addendum)
Placed in [facility] [address] [address] Ph# [phone]   Fax [phone]

## 2021-01-19 NOTE — Progress Notes (Signed)
Subjective:  Patient ID: Caleb [name], male    DOB: November 30, 1950  Age: 70 y.o. MRN: [id number]  CC: Diabetes   HPI Caleb [name] is a 70 y.o. year old male with a history of type 2 diabetes mellitus (A1c 7.7), hypertension, hyperlipidemia, s/p L 4th and 5th  toe metatarsal amputation, PAD status post b/l popliteal artery intervention, bilateral carotid artery stenosis status post bilateral  transcarotid artery revascularization (in 11/2019 and 01/2020), CHF (EF 50-55% from echo of 12/2020, grade 1 DD, LVH, moderate to severe aortic valve stenosis), b/l carotid stenosis (status post TCAR) who presents today for a follow-up visit.  Interval History:  He is on 8 units of Lantus, he receives Jardiiance from the [place] but is not taking [place] which he receives from me. Blood sugars run 80-130 He has previously complained of low sugar to the 40s and had advised him to decrease his Lantus dose.  Today he informs me that his sensor was not correctly placed on his arm.  At that time also noticed on his med list he had both [place] and [place] listed. Last month he had requested referral to endocrinology but is yet to hear from them Endorses exercising regularly and has no visual concerns, additional hypoglycemic episodes and neuropathy.  He receives all his medications from the [place].  His medication list has a lot of discrepancies-he does appear to have lisinopril/HCTZ and amlodipine on his medication list for hypertension but he informs me he only takes amlodipine and the former was discontinued at the [place]. His BP  He is seeing Urology and is on Triamix and no longer takes Viagra. Last seen by vascular 2 weeks ago for follow-up of his carotid stenosis and Doppler revealed patent bilateral carotid stents. Past Medical History:  Diagnosis Date   Aortic stenosis    Carotid artery occlusion    Diabetes mellitus without complication ([place])    Dyslipidemia 09/27/2012   Erectile dysfunction 09/27/2012    Heart murmur    Hypercholesteremia    Hyperlipidemia 08/12/2012   Hypertension    Hyponatremia 08/14/2012   Neuropathy    Pancreatitis    Pancreatitis, acute 09/27/2012   Peripheral vascular disease ([place])    Vitamin D deficiency 06/23/2015    Past Surgical History:  Procedure Laterality Date   ABDOMINAL AORTOGRAM W/LOWER EXTREMITY N/A 08/08/2019   Procedure: ABDOMINAL AORTOGRAM W/LOWER EXTREMITY;  Surgeon: Lorretta Harp, MD;  Location: [facility];  Service: Cardiovascular;  Laterality: N/A;   ABDOMINAL AORTOGRAM W/LOWER EXTREMITY N/A 11/18/2019   Procedure: ABDOMINAL AORTOGRAM W/LOWER EXTREMITY;  Surgeon: Lorretta Harp, MD;  Location: [facility];  Service: Cardiovascular;  Laterality: N/A;   COLONOSCOPY  12 years ago   in [place] clinic= normal exam per pt   KNEE SURGERY     PERIPHERAL VASCULAR INTERVENTION Right 08/08/2019   Procedure: PERIPHERAL VASCULAR INTERVENTION;  Surgeon: Lorretta Harp, MD;  Location: [facility];  Service: Cardiovascular;  Laterality: Right;   PERIPHERAL VASCULAR INTERVENTION Left 11/18/2019   Procedure: PERIPHERAL VASCULAR INTERVENTION;  Surgeon: Lorretta Harp, MD;  Location: [facility];  Service: Cardiovascular;  Laterality: Left;  left popliteal artery   TRANSCAROTID ARTERY REVASCULARIZATION  Left 12/16/2019   Procedure: TRANSCAROTID ARTERY REVASCULARIZATION;  Surgeon: Elam Dutch, MD;  Location: [facility];  Service: Vascular;  Laterality: Left;   TRANSCAROTID ARTERY REVASCULARIZATION  Right 02/03/2020   Procedure: RIGHT TRANSCAROTID ARTERY REVASCULARIZATION;  Surgeon: Elam Dutch, MD;  Location: [facility];  Service: Vascular;  Laterality: Right;   ULTRASOUND GUIDANCE FOR VASCULAR ACCESS Right 12/16/2019   Procedure: ULTRASOUND GUIDANCE FOR VASCULAR ACCESS;  Surgeon: Elam Dutch, MD;  Location: [facility] OR;  Service: Vascular;  Laterality: Right;   ULTRASOUND GUIDANCE FOR VASCULAR ACCESS Right 02/03/2020   Procedure:  ULTRASOUND GUIDANCE FOR VASCULAR ACCESS;  Surgeon: Elam Dutch, MD;  Location: [facility] OR;  Service: Vascular;  Laterality: Right;    Family History  Problem Relation Age of Onset   CAD Father    Hypertension Father    Alcohol abuse Father        Cause of death   Diabetes Mother    Colon polyps Mother    CAD Brother 75       CABG   Colon cancer Neg Hx    Esophageal cancer Neg Hx    Rectal cancer Neg Hx    Stomach cancer Neg Hx     Allergies  Allergen Reactions   Percocet [Oxycodone-Acetaminophen] Nausea And Vomiting    10/325. Pt has 3 doses of this medication. N/V, temperature 97, no chills and slight sweating. Pt dressing was C/D/I no drainage noted.     Outpatient Medications Prior to Visit  Medication Sig Dispense Refill   amLODipine (NORVASC) 5 MG tablet Take 1 tablet by mouth daily.     aspirin EC 81 MG tablet Take 81 mg by mouth daily.     atorvastatin (LIPITOR) 40 MG tablet Take by mouth.     Blood Glucose Monitoring Suppl (ONETOUCH VERIO) w/Device KIT 1 each by Does not apply route 2 (two) times daily. Use as instructed twice daily, check fasting sugar in morning and check before bedtime. E11.8 1 kit 0   clopidogrel (PLAVIX) 75 MG tablet TAKE 1 TABLET (75 MG TOTAL) BY MOUTH DAILY. 30 tablet 11   collagenase (SANTYL) ointment Apply 1 application topically daily. 15 g 0   collagenase (SANTYL) ointment Apply 1 application topically daily. Apply Santyl Ointment to scab on left 3rd and 4th digits. Measurements: Left 3rd toe 1.0 x 0.6 cm Left 4th toe: 2.0 x 1.0 cm 30 g 0   Continuous Blood Gluc Receiver (FREESTYLE LIBRE 14 DAY READER) DEVI 1 each by Does not apply route See admin instructions. Use as directed to check blood glucose 1 each 0   Continuous Blood Gluc Sensor (FREESTYLE LIBRE 14 DAY SENSOR) MISC INJECT IN THE SKIN EVERY 14 (FOURTEEN) DAYS. E11.69 Z79.4 6 each 3   empagliflozin (JARDIANCE) 25 MG TABS tablet Take 25 mg by mouth daily. Pt states only taking 1/2 tab  per day     gabapentin (NEURONTIN) 300 MG capsule TAKE 1 CAPSULE TWICE DAILY 180 capsule 0   gabapentin (NEURONTIN) 800 MG tablet Take 1 tablet by mouth 2 (two) times daily.     glucose blood test strip Use as instructed twice daily, check fasting sugar in morning and check before bedtime. E11.8 100 each 12   insulin aspart protamine- aspart (NOVOLOG MIX 70/30) (70-30) 100 UNIT/ML injection Inject into the skin.     insulin NPH-regular Human (NOVOLIN 70/30) (70-30) 100 UNIT/ML injection Inject 12 Units into the skin 2 (two) times daily with a meal. 20 mL 6   Insulin Syringe-Needle U-100 (TRUEPLUS INSULIN SYRINGE) 31G X 5/16" 0.3 ML MISC Use as instructed to inject insulin daily. 100 each 2   Menthol-Methyl Salicylate (THERA-GESIC) 0.5-15 % CREA Apply topically.     metFORMIN (GLUCOPHAGE) 1000 MG tablet Take 1 tablet (  1,000 mg total) by mouth 2 (two) times daily with a meal. 360 tablet 1   metFORMIN (GLUCOPHAGE) 500 MG tablet Take 1 tablet by mouth 2 (two) times daily.     metoprolol tartrate (LOPRESSOR) 50 MG tablet Take 50 mg by mouth 2 (two) times daily.     MODERNA COVID-19 VACCINE 100 MCG/0.5ML injection      ONETOUCH DELICA LANCETS 92Z MISC Use as instructed twice daily, check fasting sugar in morning and check before bedtime. E11.8 100 each 12   rosuvastatin (CRESTOR) 40 MG tablet Take 1 tablet (40 mg total) by mouth daily. To lower cholesterol 90 tablet 1   silver sulfADIAZINE (SILVADENE) 1 % cream Apply to affected toes once daily and cover with dressing. 400 g 1   collagenase (SANTYL) ointment Apply 1 application topically daily. (Patient not taking: Reported on 01/19/2021) 90 each 0   dapagliflozin propanediol (FARXIGA) 10 MG TABS tablet Take 1 tablet (10 mg total) by mouth daily before breakfast. (Patient not taking: No sig reported) 90 tablet 1   lisinopril-hydrochlorothiazide (ZESTORETIC) 20-12.5 MG tablet TAKE 2 TABLETS EVERY DAY (Patient not taking: No sig reported) 180 tablet 0    methocarbamol (ROBAXIN) 750 MG tablet Take 1 tablet by mouth at bedtime. (Patient not taking: Reported on 01/19/2021)     mupirocin ointment (BACTROBAN) 2 % Apply topically. (Patient not taking: No sig reported)     ondansetron (ZOFRAN) 4 MG tablet Take 1 tablet (4 mg total) by mouth every 8 (eight) hours as needed for nausea or vomiting. (Patient not taking: No sig reported) 30 tablet 0   sildenafil (VIAGRA) 50 MG tablet TAKE 2 TABLETS DAILY AS NEEDED FOR ERECTILE DYSFUNCTION (Patient not taking: Reported on 01/19/2021) 18 tablet 0   Facility-Administered Medications Prior to Visit  Medication Dose Route Frequency Provider Last Rate Last Admin   sodium chloride flush (NS) 0.9 % injection 3 mL  3 mL Intravenous Q12H Lorretta Harp, MD       sodium chloride flush (NS) 0.9 % injection 3 mL  3 mL Intravenous Q12H Lorretta Harp, MD         ROS Review of Systems  Constitutional:  Negative for activity change and appetite change.  HENT:  Negative for sinus pressure and sore throat.   Eyes:  Negative for visual disturbance.  Respiratory:  Negative for cough, chest tightness and shortness of breath.   Cardiovascular:  Negative for chest pain and leg swelling.  Gastrointestinal:  Negative for abdominal distention, abdominal pain, constipation and diarrhea.  Endocrine: Negative.   Genitourinary:  Negative for dysuria.  Musculoskeletal:  Negative for joint swelling and myalgias.  Skin:  Negative for rash.  Allergic/Immunologic: Negative.   Neurological:  Negative for weakness, light-headedness and numbness.  Psychiatric/Behavioral:  Negative for dysphoric mood and suicidal ideas.    Objective:  BP (!) 169/89   Pulse 100   Ht '5\' 7"'  (1.702 m)   Wt 178 lb 4 oz (80.9 kg)   SpO2 (!) 73%   BMI 27.92 kg/m   BP/Weight 01/19/2021 12/31/2020 3/00/7622  Systolic BP 633 354 562  Diastolic BP 89 74 84  Wt. (Lbs) 178.25 178 174  BMI 27.92 27.88 27.25      Physical Exam Constitutional:       Appearance: He is well-developed.  Cardiovascular:     Rate and Rhythm: Normal rate.     Heart sounds: Murmur heard.  Pulmonary:     Effort: Pulmonary effort is normal.  Breath sounds: Normal breath sounds. No wheezing or rales.  Chest:     Chest wall: No tenderness.  Abdominal:     General: Bowel sounds are normal. There is no distension.     Palpations: Abdomen is soft. There is no mass.     Tenderness: There is no abdominal tenderness.  Musculoskeletal:        General: Normal range of motion.     Right lower leg: No edema.     Left lower leg: No edema.  Neurological:     Mental Status: He is alert and oriented to person, place, and time.  Psychiatric:        Mood and Affect: Mood normal.    CMP Latest Ref Rng & Units 06/05/2020 05/27/2020 02/04/2020  Glucose 70 - 99 mg/dL - 173(H) 179(H)  BUN 8 - 23 mg/dL - 31(H) 32(H)  Creatinine 0.61 - 1.24 mg/dL - 1.47(H) 1.43(H)  Sodium 135 - 145 mmol/L - 139 135  Potassium 3.5 - 5.1 mmol/L - 4.3 4.4  Chloride 98 - 111 mmol/L - 102 102  CO2 22 - 32 mmol/L - 23 23  Calcium 8.9 - 10.3 mg/dL - 10.0 8.3(L)  Total Protein 6.0 - 8.5 g/dL 7.5 - -  Total Bilirubin 0.0 - 1.2 mg/dL 0.2 - -  Alkaline Phos 44 - 121 IU/L 130(H) - -  AST 0 - 40 IU/L 26 - -  ALT 0 - 44 IU/L 24 - -    Lipid Panel     Component Value Date/Time   CHOL 106 06/05/2020 0947   TRIG 138 06/05/2020 0947   HDL 53 06/05/2020 0947   CHOLHDL 2.0 06/05/2020 0947   CHOLHDL 3.9 04/02/2013 1632   VLDL 66 (H) 04/02/2013 1632   LDLCALC 29 06/05/2020 0947    CBC    Component Value Date/Time   WBC 6.7 05/27/2020 1546   RBC 5.51 05/27/2020 1546   HGB 15.3 05/27/2020 1546   HGB 13.3 11/15/2019 1139   HCT 48.2 05/27/2020 1546   HCT 40.2 11/15/2019 1139   PLT 298 05/27/2020 1546   PLT 300 11/15/2019 1139   MCV 87.5 05/27/2020 1546   MCV 86 11/15/2019 1139   MCH 27.8 05/27/2020 1546   MCHC 31.7 05/27/2020 1546   RDW 13.6 05/27/2020 1546   RDW 14.5 11/15/2019 1139    LYMPHSABS 3.3 (H) 09/28/2017 1113   MONOABS 0.5 06/09/2015 1705   EOSABS 0.3 09/28/2017 1113   BASOSABS 0.0 09/28/2017 1113   Lab Results  Component Value Date   HGBA1C 7.7 (A) 01/19/2021    Assessment & Plan:  1. Type 2 diabetes mellitus with diabetic peripheral angiopathy without gangrene, with long-term current use of insulin (HCC) Not fully optimized with A1c of 7.7 Goal is less than 7.0 He had previously requested endocrine referral which was placed and I have provided him with the phone number to reach out to the practice No regimen change today Counseled on Diabetic diet, my plate method, 829 minutes of moderate intensity exercise/week Blood sugar logs with fasting goals of 80-120 mg/dl, random of less than 180 and in the event of sugars less than 60 mg/dl or greater than 400 mg/dl encouraged to notify the clinic. Advised on the need for annual eye exams, annual foot exams, Pneumonia vaccine. - POCT glucose (manual entry) - POCT glycosylated hemoglobin (Hb A1C) - CMP14+EGFR  2. Need for influenza vaccination - Flu Vaccine QUAD High Dose(Fluad)  3. Hypertensive heart disease with chronic combined systolic  and diastolic congestive heart failure (HCC) EF 50 to 55% Euvolemic Continue guideline directed medical therapy Follow-up with cardiology  4. Bilateral carotid artery disease, unspecified type ([place]) Status post bilateral carotid endarterectomy Risk factor modification Follow-up with vascular - clopidogrel (PLAVIX) 75 MG tablet; Take 1 tablet (75 mg total) by mouth daily.  Dispense: 90 tablet; Refill: 1  5. Hypertension associated with diabetes ([place]) Slightly above goal We will make no regimen changes to prevent confusion as he is currently managed by primary care at the [facility] and also here I have updated his med list today Counseled on blood pressure goal of less than 130/80, low-sodium, DASH diet, medication compliance, 150 minutes of moderate intensity exercise per  week. Discussed medication compliance, adverse effects.  No orders of the defined types were placed in this encounter.  Return in about 6 months (around 07/19/2021) for Medical conditions.       [name], MD, FAAFP. [facility] [place], [place]   01/19/2021, 9:41 AM

## 2021-01-20 LAB — CMP14+EGFR
ALT: 17 IU/L (ref 0–44)
AST: 25 IU/L (ref 0–40)
Albumin/Globulin Ratio: 1 — ABNORMAL LOW (ref 1.2–2.2)
Albumin: 3.9 g/dL (ref 3.8–4.8)
Alkaline Phosphatase: 163 IU/L — ABNORMAL HIGH (ref 44–121)
BUN/Creatinine Ratio: 17 (ref 10–24)
BUN: 20 mg/dL (ref 8–27)
Bilirubin Total: <0.2 mg/dL (ref 0.0–1.2)
CO2: 23 mmol/L (ref 20–29)
Calcium: 9.3 mg/dL (ref 8.6–10.2)
Chloride: 104 mmol/L (ref 96–106)
Creatinine, Ser: 1.19 mg/dL (ref 0.76–1.27)
Globulin, Total: 3.9 g/dL (ref 1.5–4.5)
Glucose: 110 mg/dL — ABNORMAL HIGH (ref 70–99)
Potassium: 4.8 mmol/L (ref 3.5–5.2)
Sodium: 140 mmol/L (ref 134–144)
Total Protein: 7.8 g/dL (ref 6.0–8.5)
eGFR: 66 mL/min/{1.73_m2} (ref >59)

## 2021-01-26 ENCOUNTER — Telehealth: Payer: Self-pay | Admitting: Family Medicine

## 2021-01-26 NOTE — Telephone Encounter (Signed)
Patient came by and stated his Clopidogrel was not at the [facility]. Patient stated the the Pharmacy is requesting the prescription request along with the last visit note with results and med list to be fax over to [phone]. Please advise.

## 2021-01-26 NOTE — Telephone Encounter (Signed)
Paperwork has been faxed over to [facility]. Pt came in office to sign medical release so that documents can be faxed over.

## 2021-02-05 ENCOUNTER — Other Ambulatory Visit: Payer: Self-pay

## 2021-02-05 NOTE — Patient Outreach (Signed)
[facility]) Care Management  02/05/2021  BINYOMIN BRANN 1950/10/02 [id number]   Telephone Assessment   Successful outreach call to patient. Patient reports he is doing well. Denies any new issues or concerns. He continues to reside in his apartment alone. He remains independent with ADLs/IADLs. Denies any falls. Appetite reported as good. Wgt stable. No RN CM needs or concerns voiced this call.    Medications Reviewed Today     Reviewed by Hayden Pedro, RN (Registered Nurse) on 02/05/21 at 1016  Med List Status: <None>   Medication Order Taking? Sig Documenting Provider Last Dose Status Informant  amLODipine (NORVASC) 5 MG tablet [id number] No Take 1 tablet by mouth daily. [provider] Taking Active   aspirin EC 81 MG tablet [id number] No Take 81 mg by mouth daily. [provider] Taking Active Self  atorvastatin (LIPITOR) 40 MG tablet [id number] No Take by mouth. [provider] Taking Active   Blood Glucose Monitoring Suppl (ONETOUCH VERIO) w/Device KIT [id number] No 1 each by Does not apply route 2 (two) times daily. Use as instructed twice daily, check fasting sugar in morning and check before bedtime. E11.8 [name], MD Taking Active   clopidogrel (PLAVIX) 75 MG tablet [id number]  Take 1 tablet (75 mg total) by mouth daily. [name], MD  Active   collagenase (SANTYL) ointment [id number] No Apply 1 application topically daily. Felipa Furnace, DPM Taking Active   collagenase Annitta Needs) ointment [id number] No Apply 1 application topically daily. Apply Santyl Ointment to scab on left 3rd and 4th digits. Measurements: Left 3rd toe 1.0 x 0.6 cm Left 4th toe: 2.0 x 1.0 cm Felipa Furnace, DPM Taking Active   collagenase (SANTYL) ointment [id number] No Apply 1 application topically daily.  Patient not taking: Reported on 01/19/2021   Felipa Furnace, DPM Not Taking Active   Continuous Blood Gluc Receiver (FREESTYLE LIBRE 14 DAY  READER) DEVI [id number] No 1 each by Does not apply route See admin instructions. Use as directed to check blood glucose [name], MD Taking Active Self  Continuous Blood Gluc Sensor (FREESTYLE LIBRE 14 DAY SENSOR) MISC [id number] No INJECT IN THE SKIN EVERY 14 (FOURTEEN) DAYS. E11.69 Z79.4 [name], MD Taking Active Self  empagliflozin (JARDIANCE) 25 MG TABS tablet [id number] No Take 25 mg by mouth daily. Pt states only taking 1/2 tab per day [provider] Taking Active Self  gabapentin (NEURONTIN) 300 MG capsule [id number] No TAKE 1 CAPSULE TWICE DAILY Newlin, Enobong, MD Taking Active   gabapentin (NEURONTIN) 800 MG tablet [id number] No Take 1 tablet by mouth 2 (two) times daily. [provider] Taking Active   glucose blood test strip [id number] No Use as instructed twice daily, check fasting sugar in morning and check before bedtime. E11.8 [name], MD Taking Active Self  insulin aspart protamine- aspart (NOVOLOG MIX 70/30) (70-30) 100 UNIT/ML injection [id number] No Inject into the skin. [provider] Taking Active   insulin NPH-regular Human (NOVOLIN 70/30) (70-30) 100 UNIT/ML injection [id number] No Inject 12 Units into the skin 2 (two) times daily with a meal. Newlin, Enobong, MD Taking Active   Insulin Syringe-Needle U-100 (TRUEPLUS INSULIN SYRINGE) 31G X 5/16" 0.3 ML MISC [id number] No Use as instructed to inject insulin daily. [name], MD Taking Active Self  Menthol-Methyl Salicylate (THERA-GESIC) 0.5-15 % CREA [id number] No Apply topically. [provider] Taking Active   metFORMIN (GLUCOPHAGE) 500 MG tablet [id number] No Take 1 tablet by mouth 2 (two)  times daily. [provider] Taking Active   methocarbamol (ROBAXIN) 750 MG tablet [id number] No Take 1 tablet by mouth at bedtime.  Patient not taking: Reported on 01/19/2021   [provider] Not Taking Active   metoprolol tartrate (LOPRESSOR) 50 MG tablet [id number]  No Take 50 mg by mouth 2 (two) times daily. [provider] Taking Active   MODERNA COVID-19 VACCINE 100 MCG/0.5ML injection [id number] No  [provider] Taking Active   mupirocin ointment (BACTROBAN) 2 % [id number] No Apply topically.  Patient not taking: No sig reported   [provider] Not Taking Active   ondansetron (ZOFRAN) 4 MG tablet [id number] No Take 1 tablet (4 mg total) by mouth every 8 (eight) hours as needed for nausea or vomiting.  Patient not taking: No sig reported   Edrick Kins, DPM Not Taking Active   [facility] LANCETS 50N MISC [id number] No Use as instructed twice daily, check fasting sugar in morning and check before bedtime. E11.8 [name], MD Taking Active   rosuvastatin (CRESTOR) 40 MG tablet [id number] No Take 1 tablet (40 mg total) by mouth daily. To lower cholesterol [name], MD Taking Active   silver sulfADIAZINE (SILVADENE) 1 % cream [id number] No Apply to affected toes once daily and cover with dressing. Marzetta Board, DPM Taking Active Self  sodium chloride flush (NS) 0.9 % injection 3 mL [id number]   Lorretta Harp, MD  Active   sodium chloride flush (NS) 0.9 % injection 3 mL [id number]   Lorretta Harp, MD  Active             Care Plan : Diabetes Type 2 (Adult)  Updates made by Hayden Pedro, RN since 02/05/2021 12:00 AM     Problem: Glycemic Management (Diabetes, Type 2)      Long-Range Goal: Glycemic Management Optimized-Patient will report lowering of A1C level from 7.6 over the next 6 months Completed 02/05/2021  Start Date: 08/27/2020  Expected End Date: 03/19/2021  This Visit's Progress: On track  Recent Progress: Not on track  Priority: High  Note:   Evidence-based guidance:  Anticipate A1C testing (point-of-care) every 3 to 6 months based on goal attainment.  Review mutually-set A1C goal or target range.  Anticipate use of antihyperglycemic with or without insulin and  periodic adjustments; consider active involvement of pharmacist.  Provide medical nutrition therapy and development of individualized eating.  Compare self-reported symptoms of hypo or hyperglycemia to blood glucose levels, diet and fluid intake, current medications, psychosocial and physiologic stressors, change in activity and barriers to care adherence.  Promote self-monitoring of blood glucose levels.  Assess and address barriers to management plan, such as food insecurity, age, developmental ability, depression, anxiety, fear of hypoglycemia or weight gain, as well as medication cost, side effects and complicated regimen.  Consider referral to community-based diabetes education program, visiting nurse, community health worker or health coach.  Encourage regular dental care for treatment of periodontal disease; refer to dental provider when needed.   Notes:   08/27/20 RN CM assessed for med adherence and completed med review and review DM meds  RN CM provided education on diabetic diet RN CM confirmed patient has action plan in place RN CM assessed for blood glucose monitoring and any barriers.   11/13/20 RN CM reviewed cbg monitoring log and values. RN CM reinforced DM diet education. RN CM discussed recent A1C level and importance of lowering value.   94/17/40-[id number] due to duplicate care plan  and goals    Task: Alleviate Barriers to Glycemic Management Completed 02/05/2021  Note:   Care Management Activities:    - blood glucose monitoring encouraged - blood glucose readings reviewed - mutual A1C goal set or reviewed    Notes:     Care Plan : RN Care Manager POC  Updates made by Hayden Pedro, RN since 02/05/2021 12:00 AM     Problem: Chronic Disease Mgmt of Chronic Conditions-DM   Priority: High     Long-Range Goal: Development of POC for Mgmt of Chronic Conditions   Start Date: 02/05/2021  Expected End Date: 02/05/2022  Priority: High  Note:      Current Barriers:  Chronic Disease Management support and education needs related to DMII  RNCM Clinical Goal(s):  Patient will verbalize basic understanding of DMII disease process and self health management plan as evidenced by adherence to diet and monitoring cbgs demonstrate improved health management independence as evidenced by lowering of A1C        continue to work with Consulting civil engineer and/or Social Worker to address care management and care coordination needs related to DMII as evidenced by adherence to CM Team Scheduled appointments     through collaboration with Consulting civil engineer, provider, and care team.   Interventions: POC sent to PCP upon initial assessment, quarterly and with any changes in patient's conditions Inter-disciplinary care team collaboration (see longitudinal plan of care) Evaluation of current treatment plan related to  self management and patient's adherence to plan as established by provider   Diabetes Interventions: Assessed patient's understanding of A1c goal: <7% Reviewed medications with patient and discussed importance of medication adherence; Counseled on importance of regular laboratory monitoring as prescribed; Discussed plans with patient for ongoing care management follow up and provided patient with direct contact information for care management team; Reviewed cbg monitoring log Lab Results  Component Value Date   HGBA1C 7.7 (A) 01/19/2021     Patient Goals/Self-Care Activities: check feet daily for cuts, sores or redness enter blood sugar readings and medication or insulin into daily log take the blood sugar log to all doctor visits Continue to go to gym at least 2-3x/wk  Follow Up Plan:  Telephone follow up appointment with care management team member scheduled for:  quarterly call-within the month of Feb The patient has been provided with contact information for the care management team and has been advised to call with any health  related questions or concerns.        Plan: RN CM discussed with patient next outreach within the month of Feb. Patient agrees to care plan and follow up. RN CM will send quarterly update to PCP.  Enzo Montgomery, RN,BSN,CCM [facility] Telephonic Care Management Coordinator Direct Phone: [phone] Toll Free: [phone] Fax: [phone]

## 2021-03-02 DIAGNOSIS — E119 Type 2 diabetes mellitus without complications: Secondary | ICD-10-CM | POA: Diagnosis not present

## 2021-03-24 ENCOUNTER — Ambulatory Visit (INDEPENDENT_AMBULATORY_CARE_PROVIDER_SITE_OTHER): Payer: Self-pay | Admitting: Podiatry

## 2021-03-24 DIAGNOSIS — Z91199 Patient's noncompliance with other medical treatment and regimen due to unspecified reason: Secondary | ICD-10-CM

## 2021-03-24 NOTE — Progress Notes (Signed)
   Complete physical exam  Patient: Caleb [name]   DOB: 01/08/1999   71 y.o. Male  MRN: [id number]  Subjective:    No chief complaint on file.   Caleb [name] is a 71 y.o. male who presents today for a complete physical exam. She reports consuming a {diet types:17450} diet. {types:19826} She generally feels {DESC; WELL/FAIRLY WELL/POORLY:18703}. She reports sleeping {DESC; WELL/FAIRLY WELL/POORLY:18703}. She {does/does not:200015} have additional problems to discuss today.    Most recent fall risk assessment:    09/15/2021   10:42 AM  Fall Risk   Falls in the past year? 0  Number falls in past yr: 0  Injury with Fall? 0  Risk for fall due to : No Fall Risks  Follow up Falls evaluation completed     Most recent depression screenings:    09/15/2021   10:42 AM 08/06/2020   10:46 AM  PHQ 2/9 Scores  PHQ - 2 Score 0 0  PHQ- 9 Score 5     {VISON DENTAL STD PSA (Optional):27386}  {History (Optional):23778}  Patient Care Team: Jessup, Joy, NP as PCP - General (Nurse Practitioner)   Outpatient Medications Prior to Visit  Medication Sig   fluticasone (FLONASE) 50 MCG/ACT nasal spray Place 2 sprays into both nostrils in the morning and at bedtime. After 7 days, reduce to once daily.   norgestimate-ethinyl estradiol (SPRINTEC 28) 0.25-35 MG-MCG tablet Take 1 tablet by mouth daily.   Nystatin POWD Apply liberally to affected area 2 times per day   spironolactone (ALDACTONE) 100 MG tablet Take 1 tablet (100 mg total) by mouth daily.   No facility-administered medications prior to visit.    ROS        Objective:     There were no vitals taken for this visit. {Vitals History (Optional):23777}  Physical Exam   No results found for any visits on 10/21/21. {Show previous labs (optional):23779}    Assessment & Plan:    Routine Health Maintenance and Physical Exam  Immunization History  Administered Date(s) Administered   DTaP 03/24/1999, 05/20/1999,  07/29/1999, 04/13/2000, 10/28/2003   Hepatitis A 08/24/2007, 08/29/2008   Hepatitis B 01/09/1999, 02/16/1999, 07/29/1999   HiB (PRP-OMP) 03/24/1999, 05/20/1999, 07/29/1999, 04/13/2000   IPV 03/24/1999, 05/20/1999, 01/17/2000, 10/28/2003   Influenza,inj,Quad PF,6+ Mos 11/29/2013   Influenza-Unspecified 02/29/2012   MMR 01/16/2001, 10/28/2003   Meningococcal Polysaccharide 08/29/2011   Pneumococcal Conjugate-13 04/13/2000   Pneumococcal-Unspecified 07/29/1999, 10/12/1999   Tdap 08/29/2011   Varicella 01/17/2000, 08/24/2007    Health Maintenance  Topic Date Due   HIV Screening  Never done   Hepatitis C Screening  Never done   INFLUENZA VACCINE  10/19/2021   PAP-Cervical Cytology Screening  10/21/2021 (Originally 01/08/2020)   PAP SMEAR-Modifier  10/21/2021 (Originally 01/08/2020)   TETANUS/TDAP  10/21/2021 (Originally 08/28/2021)   HPV VACCINES  Discontinued   COVID-19 Vaccine  Discontinued    Discussed health benefits of physical activity, and encouraged her to engage in regular exercise appropriate for her age and condition.  Problem List Items Addressed This Visit   None Visit Diagnoses     Annual physical exam    -  Primary   Cervical cancer screening       Need for Tdap vaccination          No follow-ups on file.     Joy Jessup, NP   

## 2021-04-26 ENCOUNTER — Other Ambulatory Visit: Payer: Self-pay

## 2021-04-26 NOTE — Patient Outreach (Signed)
[facility]) Care Management  04/26/2021  Caleb [name] 11/02/1950 [id number]   Telephone Assessment     Unsuccessful outreach attempt to patient. No answer. RN CM left HIPAA compliant voicemail message along with contact info.    Plan: RN CM will make outreach attempt to patient within the month of March if no return call.   Enzo Montgomery, RN,BSN,CCM [place] Management Telephonic Care Management Coordinator Direct Phone: [phone] Toll Free: [phone] Fax: [phone]

## 2021-04-28 ENCOUNTER — Ambulatory Visit: Payer: Self-pay

## 2021-05-19 ENCOUNTER — Other Ambulatory Visit: Payer: Self-pay

## 2021-05-19 ENCOUNTER — Ambulatory Visit (HOSPITAL_COMMUNITY)
Admission: RE | Admit: 2021-05-19 | Discharge: 2021-05-19 | Disposition: A | Discharge status: Home / Self Care | Payer: Medicare HMO | Source: Ambulatory Visit | Attending: Cardiovascular Disease | Admitting: Cardiovascular Disease

## 2021-05-19 DIAGNOSIS — I739 Peripheral vascular disease, unspecified: Secondary | ICD-10-CM | POA: Insufficient documentation

## 2021-05-21 DIAGNOSIS — E119 Type 2 diabetes mellitus without complications: Secondary | ICD-10-CM | POA: Diagnosis not present

## 2021-05-26 DIAGNOSIS — E113291 Type 2 diabetes mellitus with mild nonproliferative diabetic retinopathy without macular edema, right eye: Secondary | ICD-10-CM | POA: Diagnosis not present

## 2021-05-26 DIAGNOSIS — E119 Type 2 diabetes mellitus without complications: Secondary | ICD-10-CM | POA: Diagnosis not present

## 2021-06-09 ENCOUNTER — Other Ambulatory Visit: Payer: Self-pay

## 2021-06-09 NOTE — Patient Outreach (Signed)
[place] [facility]) Care Management ? ?06/09/2021 ? ?Caleb [name] ?March 21, 1951 ?[id number] ? ? ?Telephone Assessment ? ? ?Successful outreach call placed to patient. He reports things have been going well. Denies any new issues or concerns at present. He continues to reside in his apartment alone. He is independent with ADLS/IADLs. Patient remains active and loves walking and water aerobics. Appetite good. Blood sugars controlled. He denies any RN CM needs or concerns at this time. ? ? ?Medications Reviewed Today   ? ? Reviewed by Hayden Pedro, RN (Registered Nurse) on 06/09/21 at [place] List Status: <None>  ? ?Medication Order Taking? Sig Documenting Provider Last Dose Status Informant  ?amLODipine (NORVASC) 5 MG tablet [id number] No Take 1 tablet by mouth daily. [provider] Taking Active   ?aspirin EC 81 MG tablet [id number] No Take 81 mg by mouth daily. [provider] Taking Active Self  ?atorvastatin (LIPITOR) 40 MG tablet [id number] No Take by mouth. [provider] Taking Active   ?Blood Glucose Monitoring Suppl (ONETOUCH VERIO) w/Device KIT [id number] No 1 each by Does not apply route 2 (two) times daily. Use as instructed twice daily, check fasting sugar in morning and check before bedtime. E11.8 [name], MD Taking Active   ?clopidogrel (PLAVIX) 75 MG tablet [id number]  Take 1 tablet (75 mg total) by mouth daily. [name], MD  Active   ?collagenase (SANTYL) ointment [id number] No Apply 1 application topically daily. Felipa Furnace, DPM Taking Active   ?collagenase Annitta Needs) ointment [id number] No Apply 1 application topically daily. Apply Santyl Ointment to scab on left 3rd and 4th digits. Measurements: Left 3rd toe 1.0 x 0.6 cm Left 4th toe: 2.0 x 1.0 cm Felipa Furnace, DPM Taking Active   ?collagenase (SANTYL) ointment [id number] No Apply 1 application topically daily.  ?Patient not taking: Reported on 01/19/2021  ? Felipa Furnace, DPM Not  Taking Active   ?Continuous Blood Gluc Receiver (FREESTYLE LIBRE 14 DAY READER) DEVI [id number] No 1 each by Does not apply route See admin instructions. Use as directed to check blood glucose [name], MD Taking Active Self  ?Continuous Blood Gluc Sensor (FREESTYLE LIBRE 14 DAY SENSOR) MISC [id number] No INJECT IN THE SKIN EVERY 14 (FOURTEEN) DAYS. E11.69 Z79.4 [name], MD Taking Active Self  ?empagliflozin (JARDIANCE) 25 MG TABS tablet [id number] No Take 25 mg by mouth daily. Pt states only taking 1/2 tab per day [provider] Taking Active Self  ?gabapentin (NEURONTIN) 300 MG capsule [id number] No TAKE 1 CAPSULE TWICE DAILY Newlin, Enobong, MD Taking Active   ?gabapentin (NEURONTIN) 800 MG tablet [id number] No Take 1 tablet by mouth 2 (two) times daily. [provider] Taking Active   ?glucose blood test strip [id number] No Use as instructed twice daily, check fasting sugar in morning and check before bedtime. E11.8 [name], MD Taking Active Self  ?insulin aspart protamine- aspart (NOVOLOG MIX 70/30) (70-30) 100 UNIT/ML injection [id number] No Inject into the skin. [provider] Taking Active   ?insulin NPH-regular Human (NOVOLIN 70/30) (70-30) 100 UNIT/ML injection [id number] No Inject 12 Units into the skin 2 (two) times daily with a meal. [name], MD Taking Active   ?Insulin Syringe-Needle U-100 (TRUEPLUS INSULIN SYRINGE) 31G X 5/16" 0.3 ML MISC [id number] No Use as instructed to inject insulin daily. [name], MD Taking Active Self  ?Menthol-Methyl Salicylate (THERA-GESIC) 0.5-15 % CREA [id number] No Apply topically. [provider] Taking Active   ?metFORMIN (GLUCOPHAGE) 500 MG  tablet [id number] No Take 1 tablet by mouth 2 (two) times daily. [provider] Taking Active   ?methocarbamol (ROBAXIN) 750 MG tablet [id number] No Take 1 tablet by mouth at bedtime.  ?Patient not taking: Reported on 01/19/2021  ? [provider] Not  Taking Active   ?metoprolol tartrate (LOPRESSOR) 50 MG tablet [id number] No Take 50 mg by mouth 2 (two) times daily. [provider] Taking Active   ?MODERNA COVID-19 VACCINE 100 MCG/0.5ML injection [id number] No  [provider] Taking Active   ?mupirocin ointment (BACTROBAN) 2 % [id number] No Apply topically.  ?Patient not taking: No sig reported  ? [provider] Not Taking Active   ?ondansetron (ZOFRAN) 4 MG tablet [id number] No Take 1 tablet (4 mg total) by mouth every 8 (eight) hours as needed for nausea or vomiting.  ?Patient not taking: No sig reported  ? Edrick Kins, DPM Not Taking Active   ?ONETOUCH DELICA LANCETS 38B MISC [id number] No Use as instructed twice daily, check fasting sugar in morning and check before bedtime. E11.8 [name], MD Taking Active   ?rosuvastatin (CRESTOR) 40 MG tablet [id number] No Take 1 tablet (40 mg total) by mouth daily. To lower cholesterol [name], MD Taking Active   ?silver sulfADIAZINE (SILVADENE) 1 % cream [id number] No Apply to affected toes once daily and cover with dressing. Marzetta Board, DPM Taking Active Self  ?sodium chloride flush (NS) 0.9 % injection 3 mL [id number]   Lorretta Harp, MD  Active   ?sodium chloride flush (NS) 0.9 % injection 3 mL [id number]   Lorretta Harp, MD  Active   ? ?  ?  ? ?  ?  ? ?  06/09/2021  ? 10:52 AM 01/19/2021  ?  9:45 AM 12/06/2020  ?  2:09 PM 08/27/2020  ? 10:24 AM 07/22/2020  ?  2:11 PM  ?Depression screen PHQ 2/9  ?Decreased Interest 0 0 0 0 0  ?Down, Depressed, Hopeless 0 0 0 0 0  ?PHQ - 2 Score 0 0 0 0 0  ?Altered sleeping  0   0  ?Tired, decreased energy  0   0  ?Change in appetite  0   0  ?Feeling bad or failure about yourself   0   0  ?Trouble concentrating  0   0  ?Moving slowly or fidgety/restless  0   0  ?Suicidal thoughts  0   0  ?PHQ-9 Score  0   0  ?Difficult doing work/chores  Not difficult at all     ?  ? ?  11/13/2020  ? 10:31 AM 12/06/2020  ?  2:18 PM 01/19/2021  ?  9:45 AM  02/05/2021  ? 10:17 AM 06/09/2021  ? 11:02 AM  ?Fall Risk  ?Falls in the past year? 0 0 0 0 0  ?Was there an injury with Fall? 0 0 0 0 0  ?Fall Risk Category Calculator 0 0 0 0 0  ?Fall Risk Category Low Low Low Low Low  ?Patient Fall Risk Level Low fall risk Low fall risk Low fall risk Low fall risk Low fall risk  ?Patient at Risk for Falls Due to Medication side effect No Fall Risks No Fall Risks Medication side effect Medication side effect  ?Fall risk Follow up Falls evaluation completed;Education provided Falls evaluation completed  Falls evaluation completed Falls evaluation completed  ?  ?SDOH Screenings  ? ?Alcohol Screen: Low Risk   ? Last Alcohol Screening Score (AUDIT): 1  ?  Depression (PHQ2-9): Low Risk   ? PHQ-2 Score: 0  ?Financial Resource Strain: Low Risk   ? Difficulty of Paying Living Expenses: Not hard at all  ?Food Insecurity: No Food Insecurity  ? Worried About Charity fundraiser in the Last Year: Never true  ? Ran Out of Food in the Last Year: Never true  ?Housing: Low Risk   ? Last Housing Risk Score: 0  ?Physical Activity: Sufficiently Active  ? Days of Exercise per Week: 4 days  ? Minutes of Exercise per Session: 60 min  ?Social Connections: Moderately Isolated  ? Frequency of Communication with Friends and Family: More than three times a week  ? Frequency of Social Gatherings with Friends and Family: More than three times a week  ? Attends Religious Services: More than 4 times per year  ? Active Member of Clubs or Organizations: No  ? Attends Archivist Meetings: Never  ? Marital Status: Widowed  ?Stress: No Stress Concern Present  ? Feeling of Stress : Not at all  ?Tobacco Use: Low Risk   ? Smoking Tobacco Use: Never  ? Smokeless Tobacco Use: Never  ? Passive Exposure: Not on file  ?Transportation Needs: No Transportation Needs  ? Lack of Transportation (Medical): No  ? Lack of Transportation (Non-Medical): No  ?  ? ?Care Plan : RN Care Manager POC  ?Updates made by Hayden Pedro, RN since 06/09/2021 12:00 AM  ?  ? ?Problem: Chronic Disease Mgmt of Chronic Conditions-DM   ?Priority: High  ?  ? ?Long-Range Goal: Development of POC for Mgmt of Chronic Conditions-DM   ?St

## 2021-06-10 ENCOUNTER — Ambulatory Visit: Payer: Self-pay

## 2021-06-28 ENCOUNTER — Encounter: Payer: Self-pay | Admitting: Internal Medicine

## 2021-06-28 ENCOUNTER — Ambulatory Visit: Payer: Medicare HMO | Admitting: Internal Medicine

## 2021-06-28 NOTE — Progress Notes (Deleted)
?Name: Caleb [name]  ?MRN/ DOB: [id number], 1950-03-25   ?Age/ Sex: 71 y.o., male   ? ?PCP: [name], MD   ?Reason for Endocrinology Evaluation: Type 2 Diabetes Mellitus  ?   ?Date of Initial Endocrinology Visit: 06/28/2021   ? ? ?PATIENT IDENTIFIER: [name] is a 71 y.o. male with a past medical history of HTN, PVD, T2DM, carotid artery disease,Hx acute pancreatitis (2014) and dyslipidemia. The patient presented for initial endocrinology clinic visit on 06/28/2021 for consultative assistance with his diabetes management.  ? ? ?HPI: ?[name] was  ? ? ?Diagnosed with DM years ago ?Prior Medications tried/Intolerance: *** ?Currently checking blood sugars *** x / day,  before breakfast and ***.  ?Hypoglycemia episodes : ***               Symptoms: ***                 Frequency: ***/  ?Hemoglobin A1c has ranged from 7.6% in 2020, peaking at 8.2% in 2022. ?Patient required assistance for hypoglycemia:  ?Patient has required hospitalization within the last 1 year from hyper or hypoglycemia:  ? ?In terms of diet, the patient *** ? ? ?HOME DIABETES REGIMEN: ?Metformin 500 mg ?NovoLog mix ? ? ?Statin: Yes ?ACE-I/ARB: No ?Prior Diabetic Education: {Yes/No:11203} ? ? ?METER DOWNLOAD SUMMARY: Date range evaluated: *** ?Fingerstick Blood Glucose Tests = *** ?Average Number Tests/Day = *** ?Overall Mean FS Glucose = *** ?Standard Deviation = *** ? ?BG Ranges: ?Low = *** ?High = *** ? ? ?Hypoglycemic Events/30 Days: ?BG < 50 = *** ?Episodes of symptomatic severe hypoglycemia = *** ? ? ?DIABETIC COMPLICATIONS: ?Microvascular complications:  ?Peripheral neuropathy ?Denies: CKD ?Last eye exam: Completed  ? ?Macrovascular complications:  ?PVD ?Denies: CAD, CVA ? ? ?PAST HISTORY: ?Past Medical History:  ?Past Medical History:  ?Diagnosis Date  ? Aortic stenosis   ? Carotid artery occlusion   ? Diabetes mellitus without complication ([place])   ? Dyslipidemia 09/27/2012  ? Erectile dysfunction 09/27/2012  ? Heart murmur    ? Hypercholesteremia   ? Hyperlipidemia 08/12/2012  ? Hypertension   ? Hyponatremia 08/14/2012  ? Neuropathy   ? Pancreatitis   ? Pancreatitis, acute 09/27/2012  ? Peripheral vascular disease ([place])   ? Vitamin D deficiency 06/23/2015  ? ?Past Surgical History:  ?Past Surgical History:  ?Procedure Laterality Date  ? ABDOMINAL AORTOGRAM W/LOWER EXTREMITY N/A 08/08/2019  ? Procedure: ABDOMINAL AORTOGRAM W/LOWER EXTREMITY;  Surgeon: Lorretta Harp, MD;  Location: [place] [facility];  Service: Cardiovascular;  Laterality: N/A;  ? ABDOMINAL AORTOGRAM W/LOWER EXTREMITY N/A 11/18/2019  ? Procedure: ABDOMINAL AORTOGRAM W/LOWER EXTREMITY;  Surgeon: Lorretta Harp, MD;  Location: [place] [facility];  Service: Cardiovascular;  Laterality: N/A;  ? COLONOSCOPY  12 years ago  ? in [place] clinic= normal exam per pt  ? KNEE SURGERY    ? PERIPHERAL VASCULAR INTERVENTION Right 08/08/2019  ? Procedure: PERIPHERAL VASCULAR INTERVENTION;  Surgeon: Lorretta Harp, MD;  Location: [place] [facility];  Service: Cardiovascular;  Laterality: Right;  ? PERIPHERAL VASCULAR INTERVENTION Left 11/18/2019  ? Procedure: PERIPHERAL VASCULAR INTERVENTION;  Surgeon: Lorretta Harp, MD;  Location: [place] [facility];  Service: Cardiovascular;  Laterality: Left;  left popliteal artery  ? TRANSCAROTID ARTERY REVASCULARIZATION?  Left 12/16/2019  ? Procedure: TRANSCAROTID ARTERY REVASCULARIZATION;  Surgeon: Elam Dutch, MD;  Location: [place];  Service: Vascular;  Laterality: Left;  ? TRANSCAROTID ARTERY REVASCULARIZATION?  Right 02/03/2020  ?  Procedure: RIGHT TRANSCAROTID ARTERY REVASCULARIZATION;  Surgeon: Elam Dutch, MD;  Location: [facility];  Service: Vascular;  Laterality: Right;  ? ULTRASOUND GUIDANCE FOR VASCULAR ACCESS Right 12/16/2019  ? Procedure: ULTRASOUND GUIDANCE FOR VASCULAR ACCESS;  Surgeon: Elam Dutch, MD;  Location: [place];  Service: Vascular;  Laterality: Right;  ? ULTRASOUND GUIDANCE FOR VASCULAR ACCESS Right 02/03/2020   ? Procedure: ULTRASOUND GUIDANCE FOR VASCULAR ACCESS;  Surgeon: Elam Dutch, MD;  Location: [place];  Service: Vascular;  Laterality: Right;  ? VASECTOMY    ?  ?Social History:  reports that he has never smoked. He has never used smokeless tobacco. He reports that he does not currently use alcohol. He reports that he does not use drugs. ?Family History:  ?Family History  ?Problem Relation Age of Onset  ? CAD Father   ? Hypertension Father   ? Alcohol abuse Father   ?     Cause of death  ? Diabetes Mother   ? Colon polyps Mother   ? CAD Brother 62  ?     CABG  ? Colon cancer Neg Hx   ? Esophageal cancer Neg Hx   ? Rectal cancer Neg Hx   ? Stomach cancer Neg Hx   ? ? ? ?HOME MEDICATIONS: ?Allergies as of 06/28/2021   ? ?   Reactions  ? Percocet [oxycodone-acetaminophen] Nausea And Vomiting  ? 10/325. Pt has 3 doses of this medication. N/V, temperature 97, no chills and slight sweating. Pt dressing was C/D/I no drainage noted.   ? ?  ? ?  ?Medication List  ?  ? ?  ? Accurate as of June 28, 2021  7:12 AM. If you have any questions, ask your nurse or doctor.  ?  ?  ? ?  ? ?amLODipine 5 MG tablet ?Commonly known as: NORVASC ?Take 1 tablet by mouth daily. ?  ?aspirin EC 81 MG tablet ?Take 81 mg by mouth daily. ?  ?atorvastatin 40 MG tablet ?Commonly known as: LIPITOR ?Take by mouth. ?  ?clopidogrel 75 MG tablet ?Commonly known as: PLAVIX ?Take 1 tablet (75 mg total) by mouth daily. ?  ?empagliflozin 25 MG Tabs tablet ?Commonly known as: JARDIANCE ?Take 25 mg by mouth daily. Pt states only taking 1/2 tab per day ?  ?FreeStyle Libre 14 Day Reader Kerrin Mo ?1 each by Does not apply route See admin instructions. Use as directed to check blood glucose ?  ?FreeStyle Libre 14 Day Sensor Misc ?INJECT IN THE SKIN EVERY 14 (FOURTEEN) DAYS. E11.69 Z79.4 ?  ?gabapentin 800 MG tablet ?Commonly known as: NEURONTIN ?Take 1 tablet by mouth 2 (two) times daily. ?  ?gabapentin 300 MG capsule ?Commonly known as: NEURONTIN ?TAKE 1 CAPSULE  TWICE DAILY ?  ?glucose blood test strip ?Use as instructed twice daily, check fasting sugar in morning and check before bedtime. E11.8 ?  ?insulin aspart protamine- aspart (70-30) 100 UNIT/ML injection ?Commonly known as: NOVOLOG MIX 70/30 ?Inject into the skin. ?  ?Insulin Syringe-Needle U-100 31G X 5/16" 0.3 ML Misc ?Commonly known as: TRUEplus Insulin Syringe ?Use as instructed to inject insulin daily. ?  ?metFORMIN 500 MG tablet ?Commonly known as: GLUCOPHAGE ?Take 1 tablet by mouth 2 (two) times daily. ?  ?methocarbamol 750 MG tablet ?Commonly known as: ROBAXIN ?Take 1 tablet by mouth at bedtime. ?  ?metoprolol tartrate 50 MG tablet ?Commonly known as: LOPRESSOR ?Take 50 mg by mouth 2 (two) times daily. ?  ?Moderna COVID-19 Vaccine 100 MCG/0.5ML injection ?  Generic drug: COVID-19 mRNA vaccine (Moderna) ?  ?mupirocin ointment 2 % ?Commonly known as: BACTROBAN ?Apply topically. ?  ?NovoLIN 70/30 (70-30) 100 UNIT/ML injection ?Generic drug: insulin NPH-regular Human ?Inject 12 Units into the skin 2 (two) times daily with a meal. ?  ?ondansetron 4 MG tablet ?Commonly known as: Zofran ?Take 1 tablet (4 mg total) by mouth every 8 (eight) hours as needed for nausea or vomiting. ?  ?OneTouch Delica Lancets 63Z Misc ?Use as instructed twice daily, check fasting sugar in morning and check before bedtime. E11.8 ?  ?OneTouch Verio w/Device Kit ?1 each by Does not apply route 2 (two) times daily. Use as instructed twice daily, check fasting sugar in morning and check before bedtime. E11.8 ?  ?rosuvastatin 40 MG tablet ?Commonly known as: CRESTOR ?Take 1 tablet (40 mg total) by mouth daily. To lower cholesterol ?  ?Santyl 250 UNIT/GM ointment ?Generic drug: collagenase ?Apply 1 application topically daily. ?  ?collagenase 250 UNIT/GM ointment ?Commonly known as: SANTYL ?Apply 1 application topically daily. Apply Santyl Ointment to scab on left 3rd and 4th digits. Measurements: Left 3rd toe 1.0 x 0.6 cm Left 4th toe: 2.0 x  1.0 cm ?  ?Santyl 250 UNIT/GM ointment ?Generic drug: collagenase ?Apply 1 application topically daily. ?  ?silver sulfADIAZINE 1 % cream ?Commonly known as: SILVADENE ?Apply to affected toes once daily an

## 2021-06-29 NOTE — Progress Notes (Signed)
?Name: Caleb [name]  ?MRN/ DOB: [id number], 01/18/1951   ?Age/ Sex: 71 y.o., male   ? ?PCP: [name], MD   ?Reason for Endocrinology Evaluation: Type 2 Diabetes Mellitus  ?   ?Date of Initial Endocrinology Visit: 06/30/2021   ? ? ?PATIENT IDENTIFIER: [name] is a 71 y.o. male with a past medical history of HTN, PVD, T2DM, carotid artery disease,Hx acute pancreatitis (2014) and dyslipidemia. The patient presented for initial endocrinology clinic visit on 06/30/2021 for consultative assistance with his diabetes management.  ? ? ?HPI: ?[name] was  ? ? ?Diagnosed with DM years ago ?Prior Medications tried/Intolerance: has been on insulin for a while  ?Currently checking blood sugars multiple  x / day,  through freestyle libre  ?Hypoglycemia episodes : yes               Symptoms: yes                 Frequency: rare, mainly in the fasting status  ?Hemoglobin A1c has ranged from 7.6% in 2020, peaking at 8.2% in 2022. ?Patient required assistance for hypoglycemia: no ?Patient has required hospitalization within the last 1 year from hyper or hypoglycemia: no ? ?In terms of diet, the patient eats 3 meals a day, does not snack . Avoids sodas but drinks sweet tea when he is out  ? ?[facility] ?Feet very numb as well as fingers  ?Pending penile implant  ? ? ?HOME DIABETES REGIMEN: ?Metformin 1000 mg , half a tablet BID ?Jardiance 25 mg, half a tablet a daily  ?NovoLin  mix 8 units BID  ? ? ? ? ?Statin: Yes ?ACE-I/ARB: No ? ? ? ?METER DOWNLOAD SUMMARY:did not bring  ? ? ?DIABETIC COMPLICATIONS: ?Microvascular complications:  ?Peripheral neuropathy ?Denies: CKD ?Last eye exam: Completed 05/2021 ? ?Macrovascular complications:  ?PVD ?Denies: CAD, CVA ? ? ?PAST HISTORY: ?Past Medical History:  ?Past Medical History:  ?Diagnosis Date  ? Aortic stenosis   ? Carotid artery occlusion   ? Diabetes mellitus without complication ([place])   ? Dyslipidemia 09/27/2012  ? Erectile dysfunction 09/27/2012  ? Heart  murmur   ? Hypercholesteremia   ? Hyperlipidemia 08/12/2012  ? Hypertension   ? Hyponatremia 08/14/2012  ? Neuropathy   ? Pancreatitis   ? Pancreatitis, acute 09/27/2012  ? Peripheral vascular disease ([place])   ? Vitamin D deficiency 06/23/2015  ? ?Past Surgical History:  ?Past Surgical History:  ?Procedure Laterality Date  ? ABDOMINAL AORTOGRAM W/LOWER EXTREMITY N/A 08/08/2019  ? Procedure: ABDOMINAL AORTOGRAM W/LOWER EXTREMITY;  Surgeon: Lorretta Harp, MD;  Location: [place] [facility];  Service: Cardiovascular;  Laterality: N/A;  ? ABDOMINAL AORTOGRAM W/LOWER EXTREMITY N/A 11/18/2019  ? Procedure: ABDOMINAL AORTOGRAM W/LOWER EXTREMITY;  Surgeon: Lorretta Harp, MD;  Location: [place] [facility];  Service: Cardiovascular;  Laterality: N/A;  ? COLONOSCOPY  12 years ago  ? in [place] clinic= normal exam per pt  ? KNEE SURGERY    ? PERIPHERAL VASCULAR INTERVENTION Right 08/08/2019  ? Procedure: PERIPHERAL VASCULAR INTERVENTION;  Surgeon: Lorretta Harp, MD;  Location: [place] [facility];  Service: Cardiovascular;  Laterality: Right;  ? PERIPHERAL VASCULAR INTERVENTION Left 11/18/2019  ? Procedure: PERIPHERAL VASCULAR INTERVENTION;  Surgeon: Lorretta Harp, MD;  Location: [place] [facility];  Service: Cardiovascular;  Laterality: Left;  left popliteal artery  ? TRANSCAROTID ARTERY REVASCULARIZATION?  Left 12/16/2019  ? Procedure: TRANSCAROTID ARTERY REVASCULARIZATION;  Surgeon: Elam Dutch, MD;  Location: [facility];  Service: Vascular;  Laterality: Left;  ? TRANSCAROTID ARTERY REVASCULARIZATION?  Right 02/03/2020  ? Procedure: RIGHT TRANSCAROTID ARTERY REVASCULARIZATION;  Surgeon: Elam Dutch, MD;  [facility];  Service: Vascular;  Laterality: Right;  ? ULTRASOUND GUIDANCE FOR VASCULAR ACCESS Right 12/16/2019  ? Procedure: ULTRASOUND GUIDANCE FOR VASCULAR ACCESS;  Surgeon: Elam Dutch, MD;  Location: [place];  Service: Vascular;  Laterality: Right;  ? ULTRASOUND GUIDANCE FOR VASCULAR ACCESS Right  02/03/2020  ? Procedure: ULTRASOUND GUIDANCE FOR VASCULAR ACCESS;  Surgeon: Elam Dutch, MD;  Location: [place];  Service: Vascular;  Laterality: Right;  ? VASECTOMY    ?  ?Social History:  reports that he has never smoked. He has never used smokeless tobacco. He reports that he does not currently use alcohol. He reports that he does not use drugs. ?Family History:  ?Family History  ?Problem Relation Age of Onset  ? CAD Father   ? Hypertension Father   ? Alcohol abuse Father   ?     Cause of death  ? Diabetes Mother   ? Colon polyps Mother   ? CAD Brother 58  ?     CABG  ? Colon cancer Neg Hx   ? Esophageal cancer Neg Hx   ? Rectal cancer Neg Hx   ? Stomach cancer Neg Hx   ? ? ? ?HOME MEDICATIONS: ?Allergies as of 06/30/2021   ? ?   Reactions  ? Percocet [oxycodone-acetaminophen] Nausea And Vomiting  ? 10/325. Pt has 3 doses of this medication. N/V, temperature 97, no chills and slight sweating. Pt dressing was C/D/I no drainage noted.   ? ?  ? ?  ?Medication List  ?  ? ?  ? Accurate as of June 30, 2021  8:04 AM. If you have any questions, ask your nurse or doctor.  ?  ?  ? ?  ? ?STOP taking these medications   ? ?insulin aspart protamine- aspart (70-30) 100 UNIT/ML injection ?Commonly known as: NOVOLOG MIX 70/30 ?Stopped by: Dorita Sciara, MD ?  ? ?  ? ?TAKE these medications   ? ?amLODipine 5 MG tablet ?Commonly known as: NORVASC ?Take 1 tablet by mouth daily. ?  ?aspirin EC 81 MG tablet ?Take 81 mg by mouth daily. ?  ?atorvastatin 40 MG tablet ?Commonly known as: LIPITOR ?Take by mouth. ?  ?clopidogrel 75 MG tablet ?Commonly known as: PLAVIX ?Take 1 tablet (75 mg total) by mouth daily. ?  ?empagliflozin 25 MG Tabs tablet ?Commonly known as: JARDIANCE ?Take 25 mg by mouth daily. Pt states only taking 1/2 tab per day ?  ?FreeStyle Libre 14 Day Reader Kerrin Mo ?1 each by Does not apply route See admin instructions. Use as directed to check blood glucose ?  ?FreeStyle Libre 14 Day Sensor Misc ?INJECT IN THE  SKIN EVERY 14 (FOURTEEN) DAYS. E11.69 Z79.4 ?  ?gabapentin 800 MG tablet ?Commonly known as: NEURONTIN ?Take 1 tablet by mouth 2 (two) times daily. ?  ?gabapentin 300 MG capsule ?Commonly known as: NEURONTIN ?TAKE 1 CAPSULE TWICE DAILY ?  ?glucose blood test strip ?Use as instructed twice daily, check fasting sugar in morning and check before bedtime. E11.8 ?  ?Insulin Syringe-Needle U-100 31G X 5/16" 0.3 ML Misc ?Commonly known as: TRUEplus Insulin Syringe ?Use as instructed to inject insulin daily. ?  ?metFORMIN 1000 MG tablet ?Commonly known as: GLUCOPHAGE ?Take 1,000 mg by mouth 2 (two) times daily with a meal. ?What changed: Another medication  with the same name was removed. Continue taking this medication, and follow the directions you see here. ?Changed by: Dorita Sciara, MD ?  ?methocarbamol 750 MG tablet ?Commonly known as: ROBAXIN ?Take 1 tablet by mouth at bedtime. ?  ?metoprolol tartrate 50 MG tablet ?Commonly known as: LOPRESSOR ?Take 50 mg by mouth 2 (two) times daily. ?  ?Moderna COVID-19 Vaccine 100 MCG/0.5ML injection ?Generic drug: COVID-19 mRNA vaccine (Moderna) ?  ?mupirocin ointment 2 % ?Commonly known as: BACTROBAN ?Apply topically. ?  ?NovoLIN 70/30 (70-30) 100 UNIT/ML injection ?Generic drug: insulin NPH-regular Human ?Inject 12 Units into the skin 2 (two) times daily with a meal. ?  ?ondansetron 4 MG tablet ?Commonly known as: Zofran ?Take 1 tablet (4 mg total) by mouth every 8 (eight) hours as needed for nausea or vomiting. ?  ?OneTouch Delica Lancets 57N Misc ?Use as instructed twice daily, check fasting sugar in morning and check before bedtime. E11.8 ?  ?OneTouch Verio w/Device Kit ?1 each by Does not apply route 2 (two) times daily. Use as instructed twice daily, check fasting sugar in morning and check before bedtime. E11.8 ?  ?rosuvastatin 40 MG tablet ?Commonly known as: CRESTOR ?Take 1 tablet (40 mg total) by mouth daily. To lower cholesterol ?  ?Santyl 250 UNIT/GM  ointment ?Generic drug: collagenase ?Apply 1 application topically daily. ?  ?collagenase 250 UNIT/GM ointment ?Commonly known as: SANTYL ?Apply 1 application topically daily. Apply Santyl Ointment to scab on left 3rd and

## 2021-06-30 ENCOUNTER — Encounter: Payer: Self-pay | Admitting: Internal Medicine

## 2021-06-30 ENCOUNTER — Ambulatory Visit (INDEPENDENT_AMBULATORY_CARE_PROVIDER_SITE_OTHER): Payer: Medicare HMO | Admitting: Internal Medicine

## 2021-06-30 VITALS — BP 130/82 | HR 75 | Ht 67.0 in | Wt 179.0 lb

## 2021-06-30 DIAGNOSIS — R739 Hyperglycemia, unspecified: Secondary | ICD-10-CM | POA: Diagnosis not present

## 2021-06-30 DIAGNOSIS — Z794 Long term (current) use of insulin: Secondary | ICD-10-CM | POA: Diagnosis not present

## 2021-06-30 DIAGNOSIS — E1169 Type 2 diabetes mellitus with other specified complication: Secondary | ICD-10-CM

## 2021-06-30 DIAGNOSIS — E1159 Type 2 diabetes mellitus with other circulatory complications: Secondary | ICD-10-CM

## 2021-06-30 DIAGNOSIS — E1165 Type 2 diabetes mellitus with hyperglycemia: Secondary | ICD-10-CM | POA: Diagnosis not present

## 2021-06-30 LAB — POCT GLUCOSE (DEVICE FOR HOME USE): Glucose Fasting, POC: 158 mg/dL — AB (ref 70–99)

## 2021-06-30 MED ORDER — METFORMIN HCL 1000 MG PO TABS: 1000.0000 mg | ORAL_TABLET | Freq: Two times a day (BID) | ORAL | 3 refills | Status: DC

## 2021-06-30 MED ORDER — EMPAGLIFLOZIN 25 MG PO TABS: 25.0000 mg | ORAL_TABLET | Freq: Every day | ORAL | 3 refills | Status: DC

## 2021-06-30 NOTE — Patient Instructions (Addendum)
-   Increase Metformin 1000 mg Twice daily  ?- Increase Jardiance 25 mg daily  ?- Decrease Novolin Mix 6 units Before breakfast and Supper  ? ? ? ?HOW TO TREAT LOW BLOOD SUGARS (Blood sugar LESS THAN 70 MG/DL) ?Please follow the RULE OF 15 for the treatment of hypoglycemia treatment (when your (blood sugars are less than 70 mg/dL)  ? ?STEP 1: Take 15 grams of carbohydrates when your blood sugar is low, which includes:  ?3-4 GLUCOSE TABS  OR ?3-4 OZ OF JUICE OR REGULAR SODA OR ?ONE TUBE OF GLUCOSE GEL   ? ?STEP 2: RECHECK blood sugar in 15 MINUTES ?STEP 3: If your blood sugar is still low at the 15 minute recheck --> then, go back to STEP 1 and treat AGAIN with another 15 grams of carbohydrates. ? ?

## 2021-07-01 DIAGNOSIS — E1169 Type 2 diabetes mellitus with other specified complication: Secondary | ICD-10-CM | POA: Insufficient documentation

## 2021-07-01 DIAGNOSIS — E119 Type 2 diabetes mellitus without complications: Secondary | ICD-10-CM | POA: Insufficient documentation

## 2021-07-01 DIAGNOSIS — E1165 Type 2 diabetes mellitus with hyperglycemia: Secondary | ICD-10-CM | POA: Insufficient documentation

## 2021-07-01 MED ORDER — FREESTYLE LIBRE 2 SENSOR MISC: 1.0000 | 3 refills | Status: DC

## 2021-07-09 ENCOUNTER — Encounter: Payer: Self-pay | Admitting: Family Medicine

## 2021-07-20 ENCOUNTER — Ambulatory Visit: Payer: Medicare HMO | Attending: Family Medicine | Admitting: Family Medicine

## 2021-07-20 ENCOUNTER — Encounter: Payer: Self-pay | Admitting: Family Medicine

## 2021-07-20 VITALS — BP 144/77 | HR 67 | Ht 67.0 in | Wt 184.4 lb

## 2021-07-20 DIAGNOSIS — I110 Hypertensive heart disease with heart failure: Secondary | ICD-10-CM

## 2021-07-20 DIAGNOSIS — I5042 Chronic combined systolic (congestive) and diastolic (congestive) heart failure: Secondary | ICD-10-CM

## 2021-07-20 DIAGNOSIS — E1151 Type 2 diabetes mellitus with diabetic peripheral angiopathy without gangrene: Secondary | ICD-10-CM | POA: Diagnosis not present

## 2021-07-20 DIAGNOSIS — M19041 Primary osteoarthritis, right hand: Secondary | ICD-10-CM

## 2021-07-20 DIAGNOSIS — M19042 Primary osteoarthritis, left hand: Secondary | ICD-10-CM

## 2021-07-20 DIAGNOSIS — Z794 Long term (current) use of insulin: Secondary | ICD-10-CM | POA: Diagnosis not present

## 2021-07-20 DIAGNOSIS — I152 Hypertension secondary to endocrine disorders: Secondary | ICD-10-CM

## 2021-07-20 DIAGNOSIS — I779 Disorder of arteries and arterioles, unspecified: Secondary | ICD-10-CM | POA: Diagnosis not present

## 2021-07-20 DIAGNOSIS — E1159 Type 2 diabetes mellitus with other circulatory complications: Secondary | ICD-10-CM | POA: Diagnosis not present

## 2021-07-20 DIAGNOSIS — E1149 Type 2 diabetes mellitus with other diabetic neurological complication: Secondary | ICD-10-CM

## 2021-07-20 NOTE — Progress Notes (Signed)
? ?Subjective:  ?Patient ID: Caleb [name], male    DOB: 21-Mar-1951  Age: 71 y.o. MRN: [id number] ? ?CC: Hypertension ? ? ?HPI ?Caleb [name] is a 71 y.o. year old male with a history of type 2 diabetes mellitus (A1c 8.0 in 05/2021 at the [facility] per patient), hypertension, hyperlipidemia, s/p L 4th and 5th  toe metatarsal amputation, PAD status post b/l popliteal artery intervention, bilateral carotid artery stenosis status post bilateral  transcarotid artery revascularization (in 11/2019 and 01/2020), CHF (EF 50-55% from echo of 12/2020, grade 1 DD, LVH, moderate to severe aortic valve stenosis), b/l carotid stenosis (status post TCAR) who presents today for a follow-up visit. ? ?Interval History: ?His diabetes is managed by Endocrine-Dr. Roni Bread, he had a visit with 2 weeks ago at which time his metformin dose and Jardiance doses were increased and Novolin mix was decreased.  He follows up in 3 months. ?He has not had a recent eye exam with an ophthalmologist. ?Last A1c was 8.0 at the [place].(In 05/2021). He also said he had labs done then as well.  He admits to drinking lots of tequila which could have elevated his A1c. ?He has numbness in his feet but this is controlled on gabapentin. ? ?Hands are difficult to straighten out completely and he has to straighten them out while applying pressure on his thigh.  He has no pain. ?From a cardiac standpoint he is doing well and has no chest pain or dyspnea, he has no syncope, lightheadedness, headaches. ?He has no claudication ?Past Medical History:  ?Diagnosis Date  ? Aortic stenosis   ? Carotid artery occlusion   ? Diabetes mellitus without complication ([place])   ? Dyslipidemia 09/27/2012  ? Erectile dysfunction 09/27/2012  ? Heart murmur   ? Hypercholesteremia   ? Hyperlipidemia 08/12/2012  ? Hypertension   ? Hyponatremia 08/14/2012  ? Neuropathy   ? Pancreatitis   ? Pancreatitis, acute 09/27/2012  ? Peripheral vascular disease ([place])   ? Vitamin D deficiency 06/23/2015  ? ? ?Past  Surgical History:  ?Procedure Laterality Date  ? ABDOMINAL AORTOGRAM W/LOWER EXTREMITY N/A 08/08/2019  ? Procedure: ABDOMINAL AORTOGRAM W/LOWER EXTREMITY;  Surgeon: Lorretta Harp, MD;  Location: [place] [facility];  Service: Cardiovascular;  Laterality: N/A;  ? ABDOMINAL AORTOGRAM W/LOWER EXTREMITY N/A 11/18/2019  ? Procedure: ABDOMINAL AORTOGRAM W/LOWER EXTREMITY;  Surgeon: Lorretta Harp, MD;  Location: [place] [facility];  Service: Cardiovascular;  Laterality: N/A;  ? COLONOSCOPY  12 years ago  ? in [place] clinic= normal exam per pt  ? KNEE SURGERY    ? PERIPHERAL VASCULAR INTERVENTION Right 08/08/2019  ? Procedure: PERIPHERAL VASCULAR INTERVENTION;  Surgeon: Lorretta Harp, MD;  Location: [place] [facility];  Service: Cardiovascular;  Laterality: Right;  ? PERIPHERAL VASCULAR INTERVENTION Left 11/18/2019  ? Procedure: PERIPHERAL VASCULAR INTERVENTION;  Surgeon: Lorretta Harp, MD;  Location: [place] [facility];  Service: Cardiovascular;  Laterality: Left;  left popliteal artery  ? TRANSCAROTID ARTERY REVASCULARIZATION?  Left 12/16/2019  ? Procedure: TRANSCAROTID ARTERY REVASCULARIZATION;  Surgeon: Elam Dutch, MD;  Location: [place];  Service: Vascular;  Laterality: Left;  ? TRANSCAROTID ARTERY REVASCULARIZATION?  Right 02/03/2020  ? Procedure: RIGHT TRANSCAROTID ARTERY REVASCULARIZATION;  Surgeon: Elam Dutch, MD;  Location: [facility];  Service: Vascular;  Laterality: Right;  ? ULTRASOUND GUIDANCE FOR VASCULAR ACCESS Right 12/16/2019  ? Procedure: ULTRASOUND GUIDANCE FOR VASCULAR ACCESS;  Surgeon: Elam Dutch, MD;  Location: [place];  Service: Vascular;  Laterality: Right;  ? ULTRASOUND GUIDANCE FOR VASCULAR ACCESS Right 02/03/2020  ? Procedure: ULTRASOUND GUIDANCE FOR VASCULAR ACCESS;  Surgeon: Elam Dutch, MD;  Location: [place];  Service: Vascular;  Laterality: Right;  ? VASECTOMY    ? ? ?Family History  ?Problem Relation Age of Onset  ? CAD Father   ? Hypertension Father   ? Alcohol  abuse Father   ?     Cause of death  ? Diabetes Mother   ? Colon polyps Mother   ? CAD Brother 9  ?     CABG  ? Colon cancer Neg Hx   ? Esophageal cancer Neg Hx   ? Rectal cancer Neg Hx   ? Stomach cancer Neg Hx   ? ? ?Social History  ? ?Socioeconomic History  ? Marital status: Widowed  ?  Spouse name: Not on file  ? Number of children: Not on file  ? Years of education: Not on file  ? Highest education level: Not on file  ?Occupational History  ? Occupation: Caregiver  ?  Comment: Special needs  ?Tobacco Use  ? Smoking status: Never  ? Smokeless tobacco: Never  ?Vaping Use  ? Vaping Use: Never used  ?Substance and Sexual Activity  ? Alcohol use: Not Currently  ?  Comment: rare  ? Drug use: No  ? Sexual activity: Not Currently  ?Other Topics Concern  ? Not on file  ?Social History Narrative  ? Widower.  3 daughters and 3 grandchildren.    ? ?Social Determinants of Health  ? ?Financial Resource Strain: Low Risk   ? Difficulty of Paying Living Expenses: Not hard at all  ?Food Insecurity: No Food Insecurity  ? Worried About Charity fundraiser in the Last Year: Never true  ? Ran Out of Food in the Last Year: Never true  ?Transportation Needs: No Transportation Needs  ? Lack of Transportation (Medical): No  ? Lack of Transportation (Non-Medical): No  ?Physical Activity: Sufficiently Active  ? Days of Exercise per Week: 4 days  ? Minutes of Exercise per Session: 60 min  ?Stress: No Stress Concern Present  ? Feeling of Stress : Not at all  ?Social Connections: Moderately Isolated  ? Frequency of Communication with Friends and Family: More than three times a week  ? Frequency of Social Gatherings with Friends and Family: More than three times a week  ? Attends Religious Services: More than 4 times per year  ? Active Member of Clubs or Organizations: No  ? Attends Archivist Meetings: Never  ? Marital Status: Widowed  ? ? ?Allergies  ?Allergen Reactions  ? Percocet [Oxycodone-Acetaminophen] Nausea And Vomiting  ?   10/325. Pt has 3 doses of this medication. N/V, temperature 97, no chills and slight sweating. Pt dressing was C/D/I no drainage noted.   ? ? ?Outpatient Medications Prior to Visit  ?Medication Sig Dispense Refill  ? amLODipine (NORVASC) 5 MG tablet Take 1 tablet by mouth daily.    ? aspirin EC 81 MG tablet Take 81 mg by mouth daily.    ? atorvastatin (LIPITOR) 40 MG tablet Take by mouth.    ? Blood Glucose Monitoring Suppl (ONETOUCH VERIO) w/Device KIT 1 each by Does not apply route 2 (two) times daily. Use as instructed twice daily, check fasting sugar in morning and check before bedtime. E11.8 1 kit 0  ? clopidogrel (PLAVIX) 75 MG tablet Take 1 tablet (75 mg total) by mouth daily. 90 tablet 1  ?  Continuous Blood Gluc Sensor (FREESTYLE LIBRE 2 SENSOR) MISC 1 Device by Does not apply route every 14 (fourteen) days. 6 each 3  ? empagliflozin (JARDIANCE) 25 MG TABS tablet Take 1 tablet (25 mg total) by mouth daily. Pt states only taking 1/2 tab per day 90 tablet 3  ? gabapentin (NEURONTIN) 300 MG capsule TAKE 1 CAPSULE TWICE DAILY 180 capsule 0  ? gabapentin (NEURONTIN) 800 MG tablet Take 1 tablet by mouth 2 (two) times daily.    ? glucose blood test strip Use as instructed twice daily, check fasting sugar in morning and check before bedtime. E11.8 100 each 12  ? insulin NPH-regular Human (NOVOLIN 70/30) (70-30) 100 UNIT/ML injection Inject 12 Units into the skin 2 (two) times daily with a meal. 20 mL 6  ? Insulin Syringe-Needle U-100 (TRUEPLUS INSULIN SYRINGE) 31G X 5/16" 0.3 ML MISC Use as instructed to inject insulin daily. 100 each 2  ? Menthol-Methyl Salicylate (THERA-GESIC) 0.5-15 % CREA Apply topically.    ? metFORMIN (GLUCOPHAGE) 1000 MG tablet Take 1 tablet (1,000 mg total) by mouth 2 (two) times daily with a meal. 180 tablet 3  ? metoprolol tartrate (LOPRESSOR) 50 MG tablet Take 50 mg by mouth 2 (two) times daily.    ? MODERNA COVID-19 VACCINE 100 MCG/0.5ML injection     ? mupirocin ointment (BACTROBAN) 2  % Apply topically.    ? ondansetron (ZOFRAN) 4 MG tablet Take 1 tablet (4 mg total) by mouth every 8 (eight) hours as needed for nausea or vomiting. 30 tablet 0  ? ONETOUCH DELICA LANCETS 11B MISC Use

## 2021-07-20 NOTE — Patient Instructions (Signed)
Diabetic Neuropathy ?Diabetic neuropathy refers to nerve damage that is caused by diabetes. Over time, people with diabetes can develop nerve damage throughout the body. There are several types of diabetic neuropathy: ?Peripheral neuropathy. This is the most common type of diabetic neuropathy. It damages the nerves that carry signals between the spinal cord and other parts of the body (peripheral nerves). This usually affects nerves in the feet, legs, hands, and arms. ?Autonomic neuropathy. This type causes damage to nerves that control involuntary functions (autonomic nerves). Involuntary functions are functions of the body that you do not control. They include heartbeat, body temperature, blood pressure, urination, digestion, sweating, sexual function, or response to changes in blood glucose. ?Focal neuropathy. This type of nerve damage affects one area of the body, such as an arm, a leg, or the face. The injury may involve one nerve or a small group of nerves. Focal neuropathy can be painful and unpredictable. It occurs most often in older adults with diabetes. This often develops suddenly, but usually improves over time and does not cause long-term problems. ?Proximal neuropathy. This type of nerve damage affects the nerves of the thighs, hips, buttocks, or legs. It causes severe pain, weakness, and muscle death (atrophy), usually in the thigh muscles. It is more common among older men and people who have type 2 diabetes. The length of recovery time may vary. ?What are the causes? ?Peripheral, autonomic, and focal neuropathies are caused by diabetes that is not well controlled with treatment. The cause of proximal neuropathy is not known, but it may be caused by inflammation related to uncontrolled blood glucose levels. ?What are the signs or symptoms? ?Peripheral neuropathy ?Peripheral neuropathy develops slowly over time. When the nerves of the feet and legs no longer work, you may experience: ?Burning,  stabbing, or aching pain in the legs or feet. ?Pain or cramping in the legs or feet. ?Loss of feeling (numbness) and inability to feel pressure or pain in the feet. This can lead to: ?Thick calluses or sores on areas of constant pressure. ?Ulcers. ?Reduced ability to feel temperature changes. ?Foot deformities. ?Muscle weakness. ?Loss of balance or coordination. ?Autonomic neuropathy ?The symptoms of autonomic neuropathy vary depending on which nerves are affected. Symptoms may include: ?Problems with digestion, such as: ?Nausea or vomiting. ?Poor appetite. ?Bloating. ?Diarrhea or constipation. ?Trouble swallowing. ?Losing weight without trying to. ?Problems with the heart, blood, and lungs, such as: ?Dizziness, especially when standing up. ?Fainting. ?Shortness of breath. ?Irregular heartbeat. ?Bladder problems, such as: ?Trouble starting or stopping urination. ?Leaking urine. ?Trouble emptying the bladder. ?Urinary tract infections (UTIs). ?Problems with other body functions, such as: ?Sweat. You may sweat too much or too little. ?Temperature. You might get hot easily. Or, you might feel cold more than usual. ?Sexual function. Men may not be able to get or maintain an erection. Women may have vaginal dryness and difficulty with arousal. ?Focal neuropathy ?Symptoms affect only one area of the body. Common symptoms include: ?Numbness. ?Tingling. ?Burning pain. ?Prickling feeling. ?Very sensitive skin. ?Weakness. ?Inability to move (paralysis). ?Muscle twitching. ?Muscles getting smaller (wasting). ?Poor coordination. ?Double or blurred vision. ?Proximal neuropathy ?Sudden, severe pain in the hip, thigh, or buttocks. Pain may spread from the back into the legs (sciatica). ?Pain and numbness in the arms and legs. ?Tingling. ?Loss of bladder control or bowel control. ?Weakness and wasting of thigh muscles. ?Difficulty getting up from a seated position. ?Abdominal swelling. ?Unexplained weight loss. ?How is this  diagnosed? ?Diagnosis varies depending on the type  of neuropathy your health care provider suspects. ?Peripheral neuropathy ?Your health care provider will do a neurologic exam. This exam checks your reflexes, how you move, and what you can feel. You may have other tests, such as: ?Blood tests. ?Tests of the fluid that surrounds the spinal cord (lumbar puncture). ?CT scan. ?MRI. ?Checking the nerves that control muscles (electromyogram, or EMG). ?Checking how quickly signals pass through your nerves (nerve conduction study). ?Checking a small piece of a nerve using a microscope (biopsy). ?Autonomic neuropathy ?You may have tests, such as: ?Tests to measure your blood pressure and heart rate. You may be secured to an exam table that moves you from a lying position to an upright position (table tilt test). ?Breathing tests to check your lungs. ?Tests to check how food moves through the digestive system (gastric emptying tests). ?Blood, sweat, or urine tests. ?Ultrasound of your bladder. ?Spinal fluid tests. ?Focal neuropathy ?This condition may be diagnosed with: ?A neurologic exam. ?CT scan. ?MRI. ?EMG. ?Nerve conduction study. ?Proximal neuropathy ?There is no test to diagnose this type of neuropathy. You may have tests to rule out other possible causes of this type of neuropathy. Tests may include: ?X-rays of your spine and lumbar region. ?Lumbar puncture. ?MRI. ?How is this treated? ?The goal of treatment is to keep nerve damage from getting worse. Treatment may include: ?Following your diabetes management plan. This will help keep your blood glucose level and your A1C level within your target range. This is the most important treatment. ?Using prescription pain medicine. ?Follow these instructions at home: ?Diabetes management ?Follow your diabetes management plan as told by your health care provider. ?Check your blood glucose levels. ?Keep your blood glucose in your target range. ?Have your A1C level checked at  least two times a year, or as often as told. ?Take over the counter and prescription medicines only as told by your health care provider. This includes insulin and diabetes medicine. ? ?Lifestyle ? ?Do not use any products that contain nicotine or tobacco, such as cigarettes, e-cigarettes, and chewing tobacco. If you need help quitting, ask your health care provider. ?Be physically active every day. Include strength training and balance exercises. ?Follow a healthy meal plan. ?Work with your health care provider to manage your blood pressure. ?General instructions ?Ask your health care provider if the medicine prescribed to you requires you to avoid driving or using machinery. ?Check your skin and feet every day for cuts, bruises, redness, blisters, or sores. ?Keep all follow-up visits. This is important. ?Contact a health care provider if: ?You have burning, stabbing, or aching pain in your legs or feet. ?You are unable to feel pressure or pain in your feet. ?You develop problems with digestion, such as: ?Nausea. ?Vomiting. ?Bloating. ?Constipation. ?Diarrhea. ?Abdominal pain. ?You have difficulty with urination, such as: ?Inability to control when you urinate (incontinence). ?Inability to completely empty the bladder (retention). ?You feel as if your heart is racing (palpitations). ?You feel dizzy, weak, or faint when you stand up. ?Get help right away if: ?You cannot urinate. ?You have sudden weakness or loss of coordination. ?You have trouble speaking. ?You have pain or pressure in your chest. ?You have an irregular heartbeat. ?You have sudden inability to move a part of your body. ?These symptoms may represent a serious problem that is an emergency. Do not wait to see if the symptoms will go away. Get medical help right away. Call your local emergency services (911 in the [place] Do not drive yourself to  the hospital. ?Summary ?Diabetic neuropathy is nerve damage that is caused by diabetes. It can cause numbness  and pain in the arms, legs, digestive tract, heart, and other body systems. ?This condition is treated by keeping your blood glucose level and your A1C level within your target range. This can help prevent neurop

## 2021-07-21 ENCOUNTER — Encounter: Payer: Self-pay | Admitting: Family Medicine

## 2021-07-21 LAB — MICROALBUMIN / CREATININE URINE RATIO
Creatinine, Urine: 65.1 mg/dL
Microalb/Creat Ratio: 573 mg/g creat — ABNORMAL HIGH (ref 0–29)
Microalbumin, Urine: 373.1 ug/mL

## 2021-07-22 ENCOUNTER — Other Ambulatory Visit: Payer: Self-pay | Admitting: Family Medicine

## 2021-07-22 DIAGNOSIS — Z794 Long term (current) use of insulin: Secondary | ICD-10-CM

## 2021-07-22 MED ORDER — AMLODIPINE BESYLATE 5 MG PO TABS: 5.0000 mg | ORAL_TABLET | Freq: Every day | ORAL | 1 refills | Status: DC

## 2021-07-22 MED ORDER — METOPROLOL TARTRATE 50 MG PO TABS: 50.0000 mg | ORAL_TABLET | Freq: Two times a day (BID) | ORAL | 1 refills | Status: DC

## 2021-07-22 NOTE — Telephone Encounter (Signed)
Would you be able to refill  ?

## 2021-07-23 NOTE — Telephone Encounter (Signed)
I am confused at he informed me he gets his medications at the [facility] and also sees [place] physicians who write his Prescription. Can you please clarify with him because I don not want a confusion with his medications. Thans ?

## 2021-08-20 ENCOUNTER — Ambulatory Visit: Payer: Medicare HMO | Admitting: Cardiovascular Disease

## 2021-09-06 DIAGNOSIS — E119 Type 2 diabetes mellitus without complications: Secondary | ICD-10-CM | POA: Diagnosis not present

## 2021-09-09 ENCOUNTER — Other Ambulatory Visit: Payer: Self-pay

## 2021-09-09 NOTE — Patient Outreach (Addendum)
[facility]) Care Management  09/09/2021 Caleb [name] 13-Sep-1950 [id number]   Telephone Assessment   Successful outreach call placed to patient. He reports he is doing well. Patient just waking up. He continues to reside in his home alone and is independent with ADLs/IADLs. He continues to be very active and works out. He is happy to report that he is up using rowing machine at [facility] for an hour at a time. Appetite remains good. Blood sugars fairly controlled. He admits to some low cbgs during late nights/early mornings and discussed ways to manage this. Denies any RN CM needs or concerns at this time.    Medications Reviewed Today     Reviewed by Hayden Pedro, RN (Registered Nurse) on 09/09/21 at 43  Med List Status: <None>   Medication Order Taking? Sig Documenting Provider Last Dose Status Informant  amLODipine (NORVASC) 5 MG tablet [id number]  Take 1 tablet (5 mg total) by mouth daily. [name], MD  Active   aspirin EC 81 MG tablet [id number] No Take 81 mg by mouth daily. [provider] Taking Active Self  atorvastatin (LIPITOR) 40 MG tablet [id number] No Take by mouth. [provider] Taking Active   Blood Glucose Monitoring Suppl (ONETOUCH VERIO) w/Device KIT [id number] No 1 each by Does not apply route 2 (two) times daily. Use as instructed twice daily, check fasting sugar in morning and check before bedtime. E11.8 [name], MD Taking Active   clopidogrel (PLAVIX) 75 MG tablet [id number] No Take 1 tablet (75 mg total) by mouth daily. [name], MD Taking Active   collagenase (SANTYL) ointment [id number] No Apply 1 application topically daily. Felipa Furnace, DPM Taking Active   collagenase Annitta Needs) ointment [id number] No Apply 1 application topically daily. Apply Santyl Ointment to scab on left 3rd and 4th digits. Measurements: Left 3rd toe 1.0 x 0.6 cm Left 4th toe: 2.0 x 1.0 cm Felipa Furnace, DPM Taking  Active   collagenase (SANTYL) ointment [id number] No Apply 1 application topically daily. Felipa Furnace, DPM Taking Active   Continuous Blood Gluc Sensor (FREESTYLE LIBRE 2 SENSOR) MISC [id number] No 1 Device by Does not apply route every 14 (fourteen) days. Shamleffer, Melanie Crazier, MD Taking Active   empagliflozin (JARDIANCE) 25 MG TABS tablet [id number] No Take 1 tablet (25 mg total) by mouth daily. Pt states only taking 1/2 tab per day Shamleffer, Melanie Crazier, MD Taking Active   gabapentin (NEURONTIN) 300 MG capsule [id number] No TAKE 1 CAPSULE TWICE DAILY Newlin, Enobong, MD Taking Active   gabapentin (NEURONTIN) 800 MG tablet [id number] No Take 1 tablet by mouth 2 (two) times daily. [provider] Taking Active   glucose blood test strip [id number] No Use as instructed twice daily, check fasting sugar in morning and check before bedtime. E11.8 [name], MD Taking Active Self  insulin NPH-regular Human (NOVOLIN 70/30) (70-30) 100 UNIT/ML injection [id number] No Inject 12 Units into the skin 2 (two) times daily with a meal. [name], MD Taking Active   Insulin Syringe-Needle U-100 (TRUEPLUS INSULIN SYRINGE) 31G X 5/16" 0.3 ML MISC [id number] No Use as instructed to inject insulin daily. [name], MD Taking Active Self  Menthol-Methyl Salicylate (THERA-GESIC) 0.5-15 % CREA [id number] No Apply topically. [provider] Taking Active   metFORMIN (GLUCOPHAGE) 1000 MG tablet [id number] No Take 1 tablet (1,000 mg total) by mouth 2 (two) times daily with a meal. Shamleffer, Melanie Crazier, MD Taking Active   metoprolol tartrate (  LOPRESSOR) 50 MG tablet [id number]  Take 1 tablet (50 mg total) by mouth 2 (two) times daily. [name], MD  Active   MODERNA COVID-19 VACCINE 100 MCG/0.5ML injection [id number] No  [provider] Taking Active   mupirocin ointment (BACTROBAN) 2 % [id number] No Apply topically. [provider] Taking Active    ondansetron (ZOFRAN) 4 MG tablet [id number] No Take 1 tablet (4 mg total) by mouth every 8 (eight) hours as needed for nausea or vomiting. Edrick Kins, DPM Taking Active   [facility] LANCETS 62X MISC [id number] No Use as instructed twice daily, check fasting sugar in morning and check before bedtime. E11.8 [name], MD Taking Active   rosuvastatin (CRESTOR) 40 MG tablet [id number] No Take 1 tablet (40 mg total) by mouth daily. To lower cholesterol [name], MD Taking Active   sodium chloride flush (NS) 0.9 % injection 3 mL [id number]   Lorretta Harp, MD  Active   sodium chloride flush (NS) 0.9 % injection 3 mL [id number]   Lorretta Harp, MD  Active             Care Plan : RN Care Manager POC  Updates made by Hayden Pedro, RN since 09/09/2021 12:00 AM     Problem: Chronic Disease Mgmt of Chronic Conditions-DM   Priority: High     Long-Range Goal: Development of POC for Mgmt of Chronic Conditions-DM   Start Date: 02/05/2021  Expected End Date: 02/05/2022  This Visit's Progress: On track  Priority: High  Note:     Current Barriers:  Chronic Disease Management support and education needs related to DMII  RNCM Clinical Goal(s):  Patient will verbalize basic understanding of DMII disease process and self health management plan as evidenced by adherence to diet and monitoring cbgs demonstrate improved health management independence as evidenced by lowering of A1C        continue to work with [place] and/or Social Worker to address care management and care coordination needs related to DMII as evidenced by adherence to CM Team Scheduled appointments     through collaboration with Consulting civil engineer, provider, and care team.   Interventions: POC sent to PCP upon initial assessment, quarterly and with any changes in patient's conditions Inter-disciplinary care team collaboration (see longitudinal plan of care) Evaluation of current treatment  plan related to  self management and patient's adherence to plan as established by provider   Diabetes Interventions:  (Status:  Goal on track:  Yes.) Long Term Goal  Assessed patient's understanding of A1c goal: <7% Reviewed medications with patient and discussed importance of medication adherence; Counseled on importance of regular laboratory monitoring as prescribed; Discussed plans with patient for ongoing care management follow up and provided patient with direct contact information for care management team; Reviewed cbg monitoring log Lab Results  Component Value Date   HGBA1C 7.7 (A) 01/19/2021    06/09/21-Patient remains active and exercises. Wgt stable. Blood sugars controlled and ranging in 90s to mid 100s. He reports upcoming apt with endocrinologist.  09/09/21-PER EMR last A1C 8.0(done at [facility]). Pt.with some hypoglycemic events during late nights/early mornings and discussed ways to manage.  Patient Goals/Self-Care Activities: check feet daily for cuts, sores or redness enter blood sugar readings and medication or insulin into daily log take the blood sugar log to all doctor visits Continue to go to gym at least 2-3x/wk  Follow Up Plan:  Telephone follow up appointment with care management team member scheduled  for:  quarterly call-within the month of Sept The patient has been provided with contact information for the care management team and has been advised to call with any health related questions or concerns.        Care Plan : RN Care Manager POC  Updates made by Hayden Pedro, RN since 09/09/2021 12:00 AM     Problem: Chronic Disease Mgmt of Chronic Conditions-DM   Priority: High     Long-Range Goal: Development of POC for Mgmt of Chronic Conditions-DM   Start Date: 02/05/2021  Expected End Date: 02/05/2022  This Visit's Progress: On track  Priority: High  Note:     Current Barriers:  Chronic Disease Management support and education needs  related to DMII  RNCM Clinical Goal(s):  Patient will verbalize basic understanding of DMII disease process and self health management plan as evidenced by adherence to diet and monitoring cbgs demonstrate improved health management independence as evidenced by lowering of A1C        continue to work with [place] and/or Social Worker to address care management and care coordination needs related to DMII as evidenced by adherence to CM Team Scheduled appointments     through collaboration with Consulting civil engineer, provider, and care team.   Interventions: POC sent to PCP upon initial assessment, quarterly and with any changes in patient's conditions Inter-disciplinary care team collaboration (see longitudinal plan of care) Evaluation of current treatment plan related to  self management and patient's adherence to plan as established by provider   Diabetes Interventions:  (Status:  Goal on track:  Yes.) Long Term Goal  Assessed patient's understanding of A1c goal: <7% Reviewed medications with patient and discussed importance of medication adherence; Counseled on importance of regular laboratory monitoring as prescribed; Discussed plans with patient for ongoing care management follow up and provided patient with direct contact information for care management team; Reviewed cbg monitoring log Lab Results  Component Value Date   HGBA1C 7.7 (A) 01/19/2021    06/09/21-Patient remains active and exercises. Wgt stable. Blood sugars controlled and ranging in 90s to mid 100s. He reports upcoming apt with endocrinologist.  09/09/21-PER EMR last A1C 8.0(done at [facility]). Pt.with some hypoglycemic events during late nights/early mornings and discussed ways to manage.  Patient Goals/Self-Care Activities: check feet daily for cuts, sores or redness enter blood sugar readings and medication or insulin into daily log take the blood sugar log to all doctor visits Continue to go to gym at least  2-3x/wk  Follow Up Plan:  Telephone follow up appointment with care management team member scheduled for:  quarterly call-within the month of Sept The patient has been provided with contact information for the care management team and has been advised to call with any health related questions or concerns.          Plan: RN CM discussed with patient next outreach within the month Sept. Patient agrees to care plan and follow up. RN CM will send quarterly update to PCP.   Enzo Montgomery, RN,BSN,CCM [place] Management Telephonic Care Management Coordinator Direct Phone: [phone] Toll Free: [phone] Fax: [phone]

## 2021-09-14 ENCOUNTER — Encounter: Payer: Self-pay | Admitting: Cardiovascular Disease

## 2021-09-14 ENCOUNTER — Ambulatory Visit (INDEPENDENT_AMBULATORY_CARE_PROVIDER_SITE_OTHER): Payer: Medicare HMO | Admitting: Cardiovascular Disease

## 2021-09-14 VITALS — BP 136/94 | HR 88 | Ht 67.0 in | Wt 186.0 lb

## 2021-09-14 DIAGNOSIS — I739 Peripheral vascular disease, unspecified: Secondary | ICD-10-CM

## 2021-09-14 DIAGNOSIS — E782 Mixed hyperlipidemia: Secondary | ICD-10-CM | POA: Diagnosis not present

## 2021-09-14 DIAGNOSIS — I70229 Atherosclerosis of native arteries of extremities with rest pain, unspecified extremity: Secondary | ICD-10-CM

## 2021-09-14 DIAGNOSIS — I998 Other disorder of circulatory system: Secondary | ICD-10-CM | POA: Diagnosis not present

## 2021-09-14 DIAGNOSIS — I2510 Atherosclerotic heart disease of native coronary artery without angina pectoris: Secondary | ICD-10-CM | POA: Diagnosis not present

## 2021-09-14 DIAGNOSIS — I10 Essential (primary) hypertension: Secondary | ICD-10-CM | POA: Diagnosis not present

## 2021-09-14 DIAGNOSIS — I350 Nonrheumatic aortic (valve) stenosis: Secondary | ICD-10-CM

## 2021-09-14 DIAGNOSIS — E785 Hyperlipidemia, unspecified: Secondary | ICD-10-CM

## 2021-09-14 NOTE — Progress Notes (Signed)
09/14/2021 Caleb [name]   1950-08-10  [id number]  Primary Physician Hoy Register, MD Primary Cardiologist: Runell Gess MD Nicholes Calam[place], [place]  HPI:  Caleb [name] is a 71 y.o.  .  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 Donzetta Matters, his podiatrist for evaluation of critical limb ischemia.  I last saw him in the office 12/15/2020. His wife of 44 years did die of ovarian cancer 3 years ago but he is very positive about meeting somebody new in his life.  He is very active and swims 4 days a week, walks 3 to 4 days a week and belongs to fitness.  He denies chest pain or shortness of breath.  His cardiac risk factors are notable for treated hypertension, diabetes and hyperlipidemia.  His father did die of a myocardial infarction age 65.  He is never had a heart attack or stroke.  He does complain of lifestyle and claudication however.  He developed wounds on his toes after walking 5 miles about [facility] with new shoes and has been treated by Dr. Donzetta Matters since that time with slow progression of wound healing.   I performed peripheral angiography on him 08/08/2019 and performed atherectomy and drug-coated balloon angioplasty of high-grade right SFA and popliteal stenosis with two-vessel runoff.  This resulted in an improvement in his claudication symptoms, healing of his right foot wounds and improvement in his Doppler studies.  He did have an occluded left popliteal artery which I will address in a staged fashion in the near future although his wounds on his left foot are slowly healing.  I also performed carotid Doppler studies that showed high-grade right greater than left ICA stenosis and have referred him to vascular surgery for consideration of endarterectomy.  He denies chest pain or shortness of breath.  He does have mild aortic stenosis by 2D echo 2  years ago and a high-pitched outflow tract murmur.  We check a 2D echo on 08/27/2019 that showed a decline in his EF from 50 to 55% down to 40 to 45% with moderate to severe aortic stenosis.  A Myoview stress test performed 08/30/2019 showed inferior scar without ischemia.  He really denies chest pain or shortness of breath.   He is referred back by Dr. Darrick Penna because of progressive gangrene in his left fourth toe.  His angiogram which I performed 08/08/2019 did show an occluded left popliteal artery with occluded posterior tibial and peroneal and high-grade AT disease.  Because of critical limb ischemia we decided to proceed with early intervention of his left popliteal CTO.   I performed left popliteal CTO intervention on 11/18/2019 placing a 5 mm x 60 mm long [place] self-expanding stent.  He had one-vessel runoff via a large anterior tibial artery.  He was discharged home the following day.  His follow-up Dopplers performed 11/28/2019 revealed an increase in his left ABI from 0.76 up to 1.02.  He did have his left fourth and fifth toes amputated by Dr. Allena Katz on 11/22/2019 and these are healing nicely.  He no longer has claudication.   He has undergone bilateral staged TCAR by Dr. Darrick Penna.  Lower extremity Dopplers performed 06/03/2020 revealed a right ABI was 1.13 and a left of 0.58 with an occluded left popliteal artery stent.    Since I saw him a year ago he continues to do well.  He exercises on his rower for hours at a time without limitation.  He denies chest pain or shortness of breath.  He does have moderate aortic stenosis by 2D echo performed 12/21/2020 with ABIs in the 0.6-0.7 range bilaterally.  He denies claudication.  Current Meds  Medication Sig   amLODipine (NORVASC) 5 MG tablet Take 1 tablet (5 mg total) by mouth daily.   aspirin EC 81 MG tablet Take 81 mg by mouth daily.   atorvastatin (LIPITOR) 40 MG tablet Take by mouth.   Blood Glucose Monitoring Suppl (ONETOUCH VERIO) w/Device KIT 1 each  by Does not apply route 2 (two) times daily. Use as instructed twice daily, check fasting sugar in morning and check before bedtime. E11.8   clopidogrel (PLAVIX) 75 MG tablet Take 1 tablet (75 mg total) by mouth daily.   collagenase (SANTYL) ointment Apply 1 application topically daily.   collagenase (SANTYL) ointment Apply 1 application topically daily. Apply Santyl Ointment to scab on left 3rd and 4th digits. Measurements: Left 3rd toe 1.0 x 0.6 cm Left 4th toe: 2.0 x 1.0 cm   collagenase (SANTYL) ointment Apply 1 application topically daily.   Continuous Blood Gluc Sensor (FREESTYLE LIBRE 2 SENSOR) MISC 1 Device by Does not apply route every 14 (fourteen) days.   empagliflozin (JARDIANCE) 25 MG TABS tablet Take 1 tablet (25 mg total) by mouth daily. Pt states only taking 1/2 tab per day   gabapentin (NEURONTIN) 300 MG capsule TAKE 1 CAPSULE TWICE DAILY   gabapentin (NEURONTIN) 800 MG tablet Take 1 tablet by mouth 2 (two) times daily.   glucose blood test strip Use as instructed twice daily, check fasting sugar in morning and check before bedtime. E11.8   insulin NPH-regular Human (NOVOLIN 70/30) (70-30) 100 UNIT/ML injection Inject 12 Units into the skin 2 (two) times daily with a meal.   Insulin Syringe-Needle U-100 (TRUEPLUS INSULIN SYRINGE) 31G X 5/16" 0.3 ML MISC Use as instructed to inject insulin daily.   Menthol-Methyl Salicylate (THERA-GESIC) 0.5-15 % CREA Apply topically.   metFORMIN (GLUCOPHAGE) 1000 MG tablet Take 1 tablet (1,000 mg total) by mouth 2 (two) times daily with a meal.   metoprolol tartrate (LOPRESSOR) 50 MG tablet Take 1 tablet (50 mg total) by mouth 2 (two) times daily.   MODERNA COVID-19 VACCINE 100 MCG/0.5ML injection    mupirocin ointment (BACTROBAN) 2 % Apply topically.   ondansetron (ZOFRAN) 4 MG tablet Take 1 tablet (4 mg total) by mouth every 8 (eight) hours as needed for nausea or vomiting.   ONETOUCH DELICA LANCETS 33G MISC Use as instructed twice daily, check  fasting sugar in morning and check before bedtime. E11.8   rosuvastatin (CRESTOR) 40 MG tablet Take 1 tablet (40 mg total) by mouth daily. To lower cholesterol   Current Facility-Administered Medications for the 09/14/21 encounter (Office Visit) with Runell Gess, MD  Medication   sodium chloride flush (NS) 0.9 % injection 3 mL   sodium chloride flush (NS) 0.9 % injection 3 mL     Allergies  Allergen Reactions   Percocet [Oxycodone-Acetaminophen] Nausea And Vomiting    10/325. Pt has 3 doses of this medication. N/V, temperature 97, no chills and slight sweating. Pt dressing was C/D/I no drainage noted.     Social History   Socioeconomic History   Marital status: Widowed    Spouse name: Not on file   Number of children: Not on file   Years of education: Not on file   Highest education  level: Not on file  Occupational History   Occupation: Caregiver    Comment: Special needs  Tobacco Use   Smoking status: Never   Smokeless tobacco: Never  Vaping Use   Vaping Use: Never used  Substance and Sexual Activity   Alcohol use: Not Currently    Comment: rare   Drug use: No   Sexual activity: Not Currently  Other Topics Concern   Not on file  Social History Narrative   Widower.  3 daughters and 3 grandchildren.     Social Determinants of Health   Financial Resource Strain: Low Risk  (12/06/2020)   Overall Financial Resource Strain (CARDIA)    Difficulty of Paying Living Expenses: Not hard at all  Food Insecurity: No Food Insecurity (06/09/2021)   Hunger Vital Sign    Worried About Running Out of Food in the Last Year: Never true    Ran Out of Food in the Last Year: Never true  Transportation Needs: No Transportation Needs (06/09/2021)   PRAPARE - Administrator, Civil Service (Medical): No    Lack of Transportation (Non-Medical): No  Physical Activity: Sufficiently Active (12/06/2020)   Exercise Vital Sign    Days of Exercise per Week: 4 days    Minutes of  Exercise per Session: 60 min  Stress: No Stress Concern Present (12/06/2020)   [employer] of Occupational Health - Occupational Stress Questionnaire    Feeling of Stress : Not at all  Social Connections: Moderately Isolated (12/06/2020)   Social Connection and Isolation Panel [NHANES]    Frequency of Communication with Friends and Family: More than three times a week    Frequency of Social Gatherings with Friends and Family: More than three times a week    Attends Religious Services: More than 4 times per year    Active Member of [employer] or Organizations: No    Attends Banker Meetings: Never    Marital Status: Widowed  Intimate Partner Violence: Not At Risk (12/06/2020)   Humiliation, Afraid, Rape, and Kick questionnaire    Fear of Current or Ex-Partner: No    Emotionally Abused: No    Physically Abused: No    Sexually Abused: No     Review of Systems: General: negative for chills, fever, night sweats or weight changes.  Cardiovascular: negative for chest pain, dyspnea on exertion, edema, orthopnea, palpitations, paroxysmal nocturnal dyspnea or shortness of breath Dermatological: negative for rash Respiratory: negative for cough or wheezing Urologic: negative for hematuria Abdominal: negative for nausea, vomiting, diarrhea, bright red blood per rectum, melena, or hematemesis Neurologic: negative for visual changes, syncope, or dizziness All other systems reviewed and are otherwise negative except as noted above.    Blood pressure (!) 136/94, pulse 88, height 5\' 7"  (1.702 m), weight 186 lb (84.4 kg).  General appearance: alert and no distress Neck: no adenopathy, no JVD, supple, symmetrical, trachea midline, thyroid not enlarged, symmetric, no tenderness/mass/nodules, and soft bilateral carotid bruits Lungs: clear to auscultation bilaterally Heart: 2/6 outflow tract murmur consistent with aortic stenosis Extremities: extremities normal, atraumatic, no cyanosis or  edema Pulses: Absent pedal pulses Skin: Skin color, texture, turgor normal. No rashes or lesions Neurologic: Grossly normal  EKG sinus rhythm at 88 with voltage for criteria for LVH.  Personally reviewed this EKG.  ASSESSMENT AND PLAN:   Hypertension History of essential hypertension a blood pressure measured today at 136/94.  He says normally his blood pressure at home is in the 130/70 range.  He  is on amlodipine, and metoprolol.  Hyperlipidemia History of hyperlipidemia on high-dose statin therapy with lipid profile performed 06/05/2020 revealing total cholesterol 106, LDL 29 and HDL 53.  Carotid artery disease (HCC) History of carotid artery disease status post stenting by Dr. Darrick Penna who he follows by duplex ultrasound.  Critical ischemia of foot (HCC) History of critical ischemia with wounds on both legs status post right SFA intervention as well as left popliteal intervention 08/08/2019.  His wound ultimately healed.  He has no claudication.  He is very active.  His last Doppler studies performed 05/19/2021 revealed a right ABI of 0.63, left of 0.79.  He does have a high-frequency signal in his proximal right SFA and an occluded left popliteal artery stent.  Moderate aortic stenosis History of moderate aortic stenosis by 2D echo performed 12/21/2020.  He is completely asymptomatic.  We will recheck 2D echo in October.     Runell Gess MD FACP,FACC,FAHA, [facility] 09/14/2021 8:59 AM

## 2021-09-14 NOTE — Assessment & Plan Note (Signed)
History of hyperlipidemia on high-dose statin therapy with lipid profile performed 06/05/2020 revealing total cholesterol 106, LDL 29 and HDL 53.

## 2021-09-29 ENCOUNTER — Encounter: Payer: Self-pay | Admitting: Internal Medicine

## 2021-09-29 ENCOUNTER — Ambulatory Visit (INDEPENDENT_AMBULATORY_CARE_PROVIDER_SITE_OTHER): Payer: Medicare HMO | Admitting: Internal Medicine

## 2021-09-29 VITALS — Ht 67.0 in | Wt 184.0 lb

## 2021-09-29 DIAGNOSIS — E1169 Type 2 diabetes mellitus with other specified complication: Secondary | ICD-10-CM

## 2021-09-29 DIAGNOSIS — E1159 Type 2 diabetes mellitus with other circulatory complications: Secondary | ICD-10-CM

## 2021-09-29 DIAGNOSIS — Z89422 Acquired absence of other left toe(s): Secondary | ICD-10-CM | POA: Diagnosis not present

## 2021-09-29 DIAGNOSIS — Z794 Long term (current) use of insulin: Secondary | ICD-10-CM | POA: Diagnosis not present

## 2021-09-29 DIAGNOSIS — E1165 Type 2 diabetes mellitus with hyperglycemia: Secondary | ICD-10-CM | POA: Diagnosis not present

## 2021-09-29 LAB — BASIC METABOLIC PANEL
BUN: 21 mg/dL (ref 6–23)
CO2: 25 mEq/L (ref 19–32)
Calcium: 9.3 mg/dL (ref 8.4–10.5)
Chloride: 104 mEq/L (ref 96–112)
Creatinine, Ser: 1.39 mg/dL (ref 0.40–1.50)
GFR: 51 mL/min — ABNORMAL LOW (ref >60.00)
Glucose, Bld: 195 mg/dL — ABNORMAL HIGH (ref 70–99)
Potassium: 3.9 mEq/L (ref 3.5–5.1)
Sodium: 137 mEq/L (ref 135–145)

## 2021-09-29 LAB — POCT GLYCOSYLATED HEMOGLOBIN (HGB A1C): Hemoglobin A1C: 8 % — AB (ref 4.0–5.6)

## 2021-09-29 MED ORDER — EMPAGLIFLOZIN-METFORMIN HCL 12.5-1000 MG PO TABS: 2.0000 | ORAL_TABLET | Freq: Every day | ORAL | 3 refills | Status: DC

## 2021-09-29 NOTE — Progress Notes (Signed)
Name: Caleb [name]  MRN/ DOB: [id number], 18-May-1950   Age/ Sex: 71 y.o., male    PCP: [name], MD   Reason for Endocrinology Evaluation: Type 2 Diabetes Mellitus     Date of Initial Endocrinology Visit: 06/30/2021    PATIENT IDENTIFIER: Caleb [name] is a 71 y.o. male with a past medical history of HTN, PVD, T2DM, carotid artery disease,Hx acute pancreatitis (2014) and dyslipidemia. The patient presented for initial endocrinology clinic visit on 06/30/2021 for consultative assistance with his diabetes management.    HPI: Caleb [name] was    Diagnosed with DM years ago Prior Medications tried/Intolerance: has been on insulin for a while  Hemoglobin A1c has ranged from 7.6% in 2020, peaking at 8.2% in 2022.  [facility] very numb as well as fingers    SUBJECTIVE:   During the last visit (06/30/2021): A1c 8.0%   Today (09/29/21): Caleb [name] is here for a follow up on diabetes management. He checks his blood sugars multiple times daily through freestyle libre . The patient has not had hypoglycemic episodes since the last clinic visit.   He is pending penile implant but his A1c has to be at 7 % His [facility] physician changed jardiance and Metformin to [place]: Synjardy 25-1000 mg daily  NovoLin  mix 6 units BID      Statin: Yes ACE-I/ARB: No     CONTINUOUS GLUCOSE MONITORING RECORD INTERPRETATION    Dates of Recording: 6/29-7/02/2022  Sensor description:freestyle libre  Results statistics:   CGM use % of time 94  Average and SD 148/24.8  Time in range     81 %  % Time Above 180 18  % Time above 250 1  % Time Below target 0    Glycemic patterns summary: hyperglycemia noted postprandial, BG's optimal at night and between meals   Hyperglycemic episodes  postprandial  Hypoglycemic episodes occurred n/a  Overnight periods: optimal     DIABETIC COMPLICATIONS: Microvascular complications:  Peripheral  neuropathy Denies: CKD Last eye exam: Completed 05/2021  Macrovascular complications:  PVD Denies: CAD, CVA   PAST HISTORY: Past Medical History:  Past Medical History:  Diagnosis Date   Aortic stenosis    Carotid artery occlusion    Diabetes mellitus without complication ([place])    Dyslipidemia 09/27/2012   Erectile dysfunction 09/27/2012   Heart murmur    Hypercholesteremia    Hyperlipidemia 08/12/2012   Hypertension    Hyponatremia 08/14/2012   Neuropathy    Pancreatitis    Pancreatitis, acute 09/27/2012   Peripheral vascular disease ([place])    Vitamin D deficiency 06/23/2015   Past Surgical History:  Past Surgical History:  Procedure Laterality Date   ABDOMINAL AORTOGRAM W/LOWER EXTREMITY N/A 08/08/2019   Procedure: ABDOMINAL AORTOGRAM W/LOWER EXTREMITY;  Surgeon: Lorretta Harp, MD;  Location: [facility];  Service: Cardiovascular;  Laterality: N/A;   ABDOMINAL AORTOGRAM W/LOWER EXTREMITY N/A 11/18/2019   Procedure: ABDOMINAL AORTOGRAM W/LOWER EXTREMITY;  Surgeon: Lorretta Harp, MD;  Location: [place] [facility];  Service: Cardiovascular;  Laterality: N/A;   COLONOSCOPY  12 years ago   in [place] clinic= normal exam per pt   KNEE SURGERY     PERIPHERAL VASCULAR INTERVENTION Right 08/08/2019   Procedure: PERIPHERAL VASCULAR INTERVENTION;  Surgeon: Lorretta Harp, MD;  Location: [place];  Service: Cardiovascular;  Laterality: Right;   PERIPHERAL VASCULAR INTERVENTION Left 11/18/2019   Procedure: PERIPHERAL  VASCULAR INTERVENTION;  Surgeon: Lorretta Harp, MD;  Location: [facility];  Service: Cardiovascular;  Laterality: Left;  left popliteal artery   TRANSCAROTID ARTERY REVASCULARIZATION  Left 12/16/2019   Procedure: TRANSCAROTID ARTERY REVASCULARIZATION;  Surgeon: Elam Dutch, MD;  Location: [facility];  Service: Vascular;  Laterality: Left;   TRANSCAROTID ARTERY REVASCULARIZATION  Right 02/03/2020   Procedure: RIGHT TRANSCAROTID ARTERY  REVASCULARIZATION;  Surgeon: Elam Dutch, MD;  Location: [facility];  Service: Vascular;  Laterality: Right;   ULTRASOUND GUIDANCE FOR VASCULAR ACCESS Right 12/16/2019   Procedure: ULTRASOUND GUIDANCE FOR VASCULAR ACCESS;  Surgeon: Elam Dutch, MD;  Location: [facility];  Service: Vascular;  Laterality: Right;   ULTRASOUND GUIDANCE FOR VASCULAR ACCESS Right 02/03/2020   Procedure: ULTRASOUND GUIDANCE FOR VASCULAR ACCESS;  Surgeon: Elam Dutch, MD;  Location: [facility];  Service: Vascular;  Laterality: Right;   VASECTOMY      Social History:  reports that he has never smoked. He has never used smokeless tobacco. He reports that he does not currently use alcohol. He reports that he does not use drugs. Family History:  Family History  Problem Relation Age of Onset   CAD Father    Hypertension Father    Alcohol abuse Father        Cause of death   Diabetes Mother    Colon polyps Mother    CAD Brother 76       CABG   Colon cancer Neg Hx    Esophageal cancer Neg Hx    Rectal cancer Neg Hx    Stomach cancer Neg Hx      HOME MEDICATIONS: Allergies as of 09/29/2021       Reactions   Percocet [oxycodone-acetaminophen] Nausea And Vomiting   10/325. Pt has 3 doses of this medication. N/V, temperature 97, no chills and slight sweating. Pt dressing was C/D/I no drainage noted.         Medication List        Accurate as of September 29, 2021  8:17 AM. If you have any questions, ask your nurse or doctor.          STOP taking these medications    empagliflozin 25 MG Tabs tablet Commonly known as: JARDIANCE Stopped by: Dorita Sciara, MD   metFORMIN 1000 MG tablet Commonly known as: GLUCOPHAGE Stopped by: Dorita Sciara, MD       TAKE these medications    amLODipine 5 MG tablet Commonly known as: NORVASC Take 1 tablet (5 mg total) by mouth daily.   aspirin EC 81 MG tablet Take 81 mg by mouth daily.   atorvastatin 40 MG tablet Commonly known as:  LIPITOR Take by mouth.   clopidogrel 75 MG tablet Commonly known as: PLAVIX Take 1 tablet (75 mg total) by mouth daily.   Empagliflozin-metFORMIN HCl ER 25-1000 MG Tb24 Take 1 tablet by mouth daily at 6 (six) AM.   FreeStyle Libre 2 Sensor Misc 1 Device by Does not apply route every 14 (fourteen) days.   gabapentin 800 MG tablet Commonly known as: NEURONTIN Take 1 tablet by mouth 2 (two) times daily.   gabapentin 300 MG capsule Commonly known as: NEURONTIN TAKE 1 CAPSULE TWICE DAILY   glucose blood test strip Use as instructed twice daily, check fasting sugar in morning and check before bedtime. E11.8   Insulin Syringe-Needle U-100 31G X 5/16" 0.3 ML Misc Commonly known as: TRUEplus Insulin Syringe Use as instructed to inject insulin  daily.   lisinopril 10 MG tablet Commonly known as: ZESTRIL Take 1 tablet by mouth daily at 6 (six) AM.   metoprolol tartrate 50 MG tablet Commonly known as: LOPRESSOR Take 1 tablet (50 mg total) by mouth 2 (two) times daily.   Moderna COVID-19 Vaccine 100 MCG/0.5ML injection Generic drug: COVID-19 mRNA vaccine (Moderna)   mupirocin ointment 2 % Commonly known as: BACTROBAN Apply topically.   NovoLIN 70/30 (70-30) 100 UNIT/ML injection Generic drug: insulin NPH-regular Human Inject 12 Units into the skin 2 (two) times daily with a meal. What changed: how much to take   ondansetron 4 MG tablet Commonly known as: Zofran Take 1 tablet (4 mg total) by mouth every 8 (eight) hours as needed for nausea or vomiting.   OneTouch Delica Lancets 14G Misc Use as instructed twice daily, check fasting sugar in morning and check before bedtime. E11.8   OneTouch Verio w/Device Kit 1 each by Does not apply route 2 (two) times daily. Use as instructed twice daily, check fasting sugar in morning and check before bedtime. E11.8   rosuvastatin 40 MG tablet Commonly known as: CRESTOR Take 1 tablet (40 mg total) by mouth daily. To lower cholesterol    Santyl 250 UNIT/GM ointment Generic drug: collagenase Apply 1 application topically daily. What changed: Another medication with the same name was removed. Continue taking this medication, and follow the directions you see here. Changed by: Dorita Sciara, MD   collagenase 250 UNIT/GM ointment Commonly known as: SANTYL Apply 1 application topically daily. Apply Santyl Ointment to scab on left 3rd and 4th digits. Measurements: Left 3rd toe 1.0 x 0.6 cm Left 4th toe: 2.0 x 1.0 cm What changed: Another medication with the same name was removed. Continue taking this medication, and follow the directions you see here. Changed by: Dorita Sciara, MD   Thera-Gesic 0.5-15 % Crea Apply topically.   vitamin B-12 500 MCG tablet Commonly known as: CYANOCOBALAMIN TAKE ONE TABLET BY MOUTH DAILY FOR ANEMIA         ALLERGIES: Allergies  Allergen Reactions   Percocet [Oxycodone-Acetaminophen] Nausea And Vomiting    10/325. Pt has 3 doses of this medication. N/V, temperature 97, no chills and slight sweating. Pt dressing was C/D/I no drainage noted.         OBJECTIVE:   VITAL SIGNS: Ht _0  (1.702 m)   Wt 184 lb (83.5 kg)   BMI 28.82 kg/m    PHYSICAL EXAM:  General: Pt appears well and is in NAD  Neck: General: Supple without adenopathy or carotid bruits. Thyroid: Thyroid size normal.  No goiter or nodules appreciated.   Lungs: Clear with good BS bilat with no rales, rhonchi, or wheezes  Heart: RRR, + systolic murmur   Abdomen: Normoactive bowel sounds, soft, nontender, without masses or organomegaly palpable  Extremities:  Lower extremities - No pretibial edema. No lesions.  Neuro: MS is good with appropriate affect, pt is alert and Ox3    DM foot exam: 06/30/2021  The skin of the feet is without sores or ulcerations. Pt with left 4th and 5th toe amputations  The pedal pulses are undetectable  The sensation is decreased to a screening 5.07, 10 gram monofilament  bilaterally   DATA REVIEWED:  Lab Results  Component Value Date   HGBA1C 8.0 (A) 09/29/2021   HGBA1C 7.7 (A) 01/19/2021   HGBA1C 8.2 (A) 10/19/2020   Lab Results  Component Value Date   CHOL 106 06/05/2020   HDL  53 06/05/2020   LDLCALC 29 06/05/2020   TRIG 138 06/05/2020   CHOLHDL 2.0 06/05/2020         Latest Reference Range & Units 09/29/21 57:50  BASIC METABOLIC PANEL  Rpt !  Sodium 135 - 145 mEq/L 137  Potassium 3.5 - 5.1 mEq/L 3.9  [place] 96 - 112 mEq/L 104  CO2 19 - 32 mEq/L 25  Glucose 70 - 99 mg/dL 195 (H)  BUN 6 - 23 mg/dL 21  Creatinine 0.40 - 1.50 mg/dL 1.39  Calcium 8.4 - 10.5 mg/dL 9.3  GFR >60.00 mL/min 51.00 (L)     ASSESSMENT / PLAN / RECOMMENDATIONS:   1) Type 2 Diabetes Mellitus, Poorly controlled, With macrovascular complications - Most recent A1c of 8.0 %. Goal A1c <7.0%.      - Despite no improvement in his A1c but his CGM download shows great glycemic control with 81% of readings at goal  - His [facility] physician switched Metformin and Jardiance to [place] but at a smaller dose with the pt taking 50 % of metformin than previously prescribed  - Pt with Hx of Pancreatitis, he is not a candidate DPP-4 inhibitors, GLP-1 agonists and Tirzeptide - GFR at 51. Pt may take metformin 2000 mg a day     MEDICATIONS:  Change Synjardy 12.07-998 mg , 2 tabs daily  Change Novolin Mix 7 units Before breakfast and 8 units before Supper   EDUCATION / INSTRUCTIONS: BG monitoring instructions: Patient is instructed to check his blood sugars 3 times a day, before meals . Call [facility] if: BG persistently < 70  I reviewed the Rule of 15 for the treatment of hypoglycemia in detail with the patient. Literature supplied.   2) Diabetic complications:  Eye: Does not have known diabetic retinopathy.  Neuro/ Feet: Does  have known diabetic peripheral neuropathy. Renal: Patient does not have known baseline CKD. He is not on an ACEI/ARB at  present.   F/U in 4 months     Signed electronically by: Caleb Guise, MD  [facility]  [address] [place], [place] [address] Phone: [phone] FAX: [phone]   CC: [name], MD [place] [address] Phone: [phone]  Fax: [phone]    Return to Endocrinology clinic as below: Future Appointments  Date Time Provider [place]  12/27/2021  9:15 AM [facility] ECHO 3 [id number]  01/20/2022 10:30 AM [name], MD [facility] None  05/30/2022 10:00 AM [facility] NL VASC 2 [facility]SECVI [facility]

## 2021-09-29 NOTE — Patient Instructions (Signed)
-   Change Synjardy 12.07-998 mg TWO tablets daily  - Change  Novolin Mix 7 units Before breakfast and 8 units before Supper     HOW TO TREAT LOW BLOOD SUGARS (Blood sugar LESS THAN 70 MG/DL) Please follow the RULE OF 15 for the treatment of hypoglycemia treatment (when your (blood sugars are less than 70 mg/dL)   STEP 1: Take 15 grams of carbohydrates when your blood sugar is low, which includes:  3-4 GLUCOSE TABS  OR 3-4 OZ OF JUICE OR REGULAR SODA OR ONE TUBE OF GLUCOSE GEL    STEP 2: RECHECK blood sugar in 15 MINUTES STEP 3: If your blood sugar is still low at the 15 minute recheck --> then, go back to STEP 1 and treat AGAIN with another 15 grams of carbohydrates.

## 2021-10-27 ENCOUNTER — Emergency Department (HOSPITAL_COMMUNITY): Payer: No Typology Code available for payment source

## 2021-10-27 ENCOUNTER — Other Ambulatory Visit: Payer: Self-pay

## 2021-10-27 ENCOUNTER — Encounter (HOSPITAL_COMMUNITY): Payer: Self-pay | Admitting: *Deleted

## 2021-10-27 ENCOUNTER — Inpatient Hospital Stay (HOSPITAL_COMMUNITY)
Admission: EM | Admit: 2021-10-27 | Discharge: 2021-10-30 | DRG: 286 | Disposition: A | Discharge status: Home / Self Care | Payer: No Typology Code available for payment source | Attending: Internal Medicine | Admitting: Internal Medicine

## 2021-10-27 DIAGNOSIS — I509 Heart failure, unspecified: Secondary | ICD-10-CM | POA: Diagnosis not present

## 2021-10-27 DIAGNOSIS — E7800 Pure hypercholesterolemia, unspecified: Secondary | ICD-10-CM | POA: Diagnosis present

## 2021-10-27 DIAGNOSIS — E119 Type 2 diabetes mellitus without complications: Secondary | ICD-10-CM | POA: Diagnosis present

## 2021-10-27 DIAGNOSIS — I5033 Acute on chronic diastolic (congestive) heart failure: Secondary | ICD-10-CM | POA: Diagnosis present

## 2021-10-27 DIAGNOSIS — I5043 Acute on chronic combined systolic (congestive) and diastolic (congestive) heart failure: Secondary | ICD-10-CM | POA: Diagnosis present

## 2021-10-27 DIAGNOSIS — Z8249 Family history of ischemic heart disease and other diseases of the circulatory system: Secondary | ICD-10-CM

## 2021-10-27 DIAGNOSIS — I5023 Acute on chronic systolic (congestive) heart failure: Secondary | ICD-10-CM | POA: Diagnosis present

## 2021-10-27 DIAGNOSIS — Z885 Allergy status to narcotic agent status: Secondary | ICD-10-CM

## 2021-10-27 DIAGNOSIS — Z811 Family history of alcohol abuse and dependence: Secondary | ICD-10-CM

## 2021-10-27 DIAGNOSIS — Z7984 Long term (current) use of oral hypoglycemic drugs: Secondary | ICD-10-CM

## 2021-10-27 DIAGNOSIS — M47814 Spondylosis without myelopathy or radiculopathy, thoracic region: Secondary | ICD-10-CM | POA: Diagnosis not present

## 2021-10-27 DIAGNOSIS — Z79899 Other long term (current) drug therapy: Secondary | ICD-10-CM

## 2021-10-27 DIAGNOSIS — Z794 Long term (current) use of insulin: Secondary | ICD-10-CM

## 2021-10-27 DIAGNOSIS — R778 Other specified abnormalities of plasma proteins: Secondary | ICD-10-CM

## 2021-10-27 DIAGNOSIS — I160 Hypertensive urgency: Secondary | ICD-10-CM | POA: Diagnosis present

## 2021-10-27 DIAGNOSIS — I739 Peripheral vascular disease, unspecified: Secondary | ICD-10-CM | POA: Diagnosis present

## 2021-10-27 DIAGNOSIS — J9811 Atelectasis: Secondary | ICD-10-CM | POA: Diagnosis present

## 2021-10-27 DIAGNOSIS — I130 Hypertensive heart and chronic kidney disease with heart failure and stage 1 through stage 4 chronic kidney disease, or unspecified chronic kidney disease: Secondary | ICD-10-CM | POA: Diagnosis not present

## 2021-10-27 DIAGNOSIS — E1151 Type 2 diabetes mellitus with diabetic peripheral angiopathy without gangrene: Secondary | ICD-10-CM | POA: Diagnosis present

## 2021-10-27 DIAGNOSIS — Z89429 Acquired absence of other toe(s), unspecified side: Secondary | ICD-10-CM

## 2021-10-27 DIAGNOSIS — Z95820 Peripheral vascular angioplasty status with implants and grafts: Secondary | ICD-10-CM

## 2021-10-27 DIAGNOSIS — I428 Other cardiomyopathies: Secondary | ICD-10-CM | POA: Diagnosis present

## 2021-10-27 DIAGNOSIS — E1165 Type 2 diabetes mellitus with hyperglycemia: Secondary | ICD-10-CM

## 2021-10-27 DIAGNOSIS — Z7902 Long term (current) use of antithrombotics/antiplatelets: Secondary | ICD-10-CM

## 2021-10-27 DIAGNOSIS — N179 Acute kidney failure, unspecified: Secondary | ICD-10-CM | POA: Diagnosis not present

## 2021-10-27 DIAGNOSIS — E1122 Type 2 diabetes mellitus with diabetic chronic kidney disease: Secondary | ICD-10-CM | POA: Diagnosis present

## 2021-10-27 DIAGNOSIS — I2510 Atherosclerotic heart disease of native coronary artery without angina pectoris: Secondary | ICD-10-CM | POA: Diagnosis present

## 2021-10-27 DIAGNOSIS — E1169 Type 2 diabetes mellitus with other specified complication: Secondary | ICD-10-CM | POA: Diagnosis present

## 2021-10-27 DIAGNOSIS — Z7982 Long term (current) use of aspirin: Secondary | ICD-10-CM

## 2021-10-27 DIAGNOSIS — Z8371 Family history of colonic polyps: Secondary | ICD-10-CM

## 2021-10-27 DIAGNOSIS — I5022 Chronic systolic (congestive) heart failure: Secondary | ICD-10-CM | POA: Diagnosis present

## 2021-10-27 DIAGNOSIS — I350 Nonrheumatic aortic (valve) stenosis: Secondary | ICD-10-CM | POA: Diagnosis present

## 2021-10-27 DIAGNOSIS — R7989 Other specified abnormal findings of blood chemistry: Secondary | ICD-10-CM | POA: Diagnosis not present

## 2021-10-27 DIAGNOSIS — Z833 Family history of diabetes mellitus: Secondary | ICD-10-CM

## 2021-10-27 DIAGNOSIS — R0602 Shortness of breath: Secondary | ICD-10-CM | POA: Diagnosis not present

## 2021-10-27 DIAGNOSIS — E1140 Type 2 diabetes mellitus with diabetic neuropathy, unspecified: Secondary | ICD-10-CM | POA: Diagnosis present

## 2021-10-27 DIAGNOSIS — N1831 Chronic kidney disease, stage 3a: Secondary | ICD-10-CM | POA: Diagnosis present

## 2021-10-27 LAB — BASIC METABOLIC PANEL
Anion gap: 8 (ref 5–15)
BUN: 16 mg/dL (ref 8–23)
CO2: 23 mmol/L (ref 22–32)
Calcium: 8.7 mg/dL — ABNORMAL LOW (ref 8.9–10.3)
Chloride: 108 mmol/L (ref 98–111)
Creatinine, Ser: 1.38 mg/dL — ABNORMAL HIGH (ref 0.61–1.24)
GFR, Estimated: 55 mL/min — ABNORMAL LOW (ref >60)
Glucose, Bld: 130 mg/dL — ABNORMAL HIGH (ref 70–99)
Potassium: 4 mmol/L (ref 3.5–5.1)
Sodium: 139 mmol/L (ref 135–145)

## 2021-10-27 LAB — BRAIN NATRIURETIC PEPTIDE: B Natriuretic Peptide: 1165.9 pg/mL — ABNORMAL HIGH (ref 0.0–100.0)

## 2021-10-27 LAB — CBC WITH DIFFERENTIAL/PLATELET
Abs Immature Granulocytes: 0.01 10*3/uL (ref 0.00–0.07)
Basophils Absolute: 0 10*3/uL (ref 0.0–0.1)
Basophils Relative: 1 %
Eosinophils Absolute: 0.2 10*3/uL (ref 0.0–0.5)
Eosinophils Relative: 3 %
HCT: 41.4 % (ref 39.0–52.0)
Hemoglobin: 13.3 g/dL (ref 13.0–17.0)
Immature Granulocytes: 0 %
Lymphocytes Relative: 37 %
Lymphs Abs: 2.1 10*3/uL (ref 0.7–4.0)
MCH: 27.3 pg (ref 26.0–34.0)
MCHC: 32.1 g/dL (ref 30.0–36.0)
MCV: 85 fL (ref 80.0–100.0)
Monocytes Absolute: 0.6 10*3/uL (ref 0.1–1.0)
Monocytes Relative: 10 %
Neutro Abs: 2.8 10*3/uL (ref 1.7–7.7)
Neutrophils Relative %: 49 %
Platelets: 325 10*3/uL (ref 150–400)
RBC: 4.87 MIL/uL (ref 4.22–5.81)
RDW: 16.3 % — ABNORMAL HIGH (ref 11.5–15.5)
WBC: 5.6 10*3/uL (ref 4.0–10.5)
nRBC: 0 % (ref 0.0–0.2)

## 2021-10-27 LAB — CBG MONITORING, ED: Glucose-Capillary: 97 mg/dL (ref 70–99)

## 2021-10-27 NOTE — ED Provider Triage Note (Signed)
Emergency Medicine Provider Triage Evaluation Note  Caleb [name] , a 71 y.o. male  was evaluated in triage.  Pt complains of shortness of breath is been ongoing for the past week and a half.  Reports he went to visit his grandson down in [place], returned he felt that he could not walk from the car into his apartment without getting very winded.  He thought he heard some wheezing however he does not have any history of asthma.  No prior history of blood clots, no coughing.  Review of Systems  Positive: Sob, wheezing Negative: cough  Physical Exam  BP (!) 155/96 (BP Location: Right Arm)   Pulse 90   Temp (!) 97.4 F (36.3 C) (Oral)   Resp 16   SpO2 99%  Gen:   Awake, no distress   Resp:  Normal effort  MSK:   Moves extremities without difficulty  Other:  Lungs are diminished to auscultation but with any wheezing or rales noted.  Medical Decision Making  Medically screening exam initiated at 5:15 PM.  Appropriate orders placed.  Caleb [name] was informed that the remainder of the evaluation will be completed by another provider, this initial triage assessment does not replace that evaluation, and the importance of remaining in the ED until their evaluation is complete.     Janeece Fitting, PA-C 10/27/21 1718

## 2021-10-27 NOTE — ED Triage Notes (Signed)
Shortness of breath for the past week and a half  no pain

## 2021-10-27 NOTE — ED Provider Notes (Signed)
[name] EM[facility] DEPARTMENT Provider Note   CSN: [id number] Arrival date & time: 10/27/21  1637     History {Add pertinent medical, surgical, social history, OB history to HPI:1} Chief Complaint  Patient presents with   Shortness of Breath    Caleb [name] is a 71 y.o. male.  The history is provided by the patient and medical records.  Shortness of Breath  71 y.o. M with hx of hypertension, hyperlipidemia, diabetes, neuropathy, carotid stenosis, peripheral vascular disease, CAD, presenting to the ED with shortness of breath.  Patient reports over the past week he has had increasing shortness of breath.  States he notices this when up and moving about or when trying to lay to sleep.  He does feel increasingly short of breath when lying flat.  States he is very active, swims 4 to 5 days a week, rows, and does other cardio so this is very unusual for him.  He did travel to [place] by car over the weekend and since then symptoms do seem worse.  He denies any history of DVT or PE.  He does report some mild cough and congestion over the past month but has not had any fever or chills.  No sick contacts.  No COVID exposures.  He has been fully vaccinated against COVID.  He has no known history of heart failure.  Home Medications Prior to Admission medications   Medication Sig Start Date End Date Taking? Authorizing Provider  amLODipine (NORVASC) 5 MG tablet Take 1 tablet (5 mg total) by mouth daily. 07/22/21   [name], MD  aspirin EC 81 MG tablet Take 81 mg by mouth daily.    [provider]  atorvastatin (LIPITOR) 40 MG tablet Take by mouth. 07/03/20   [provider]  Blood Glucose Monitoring Suppl (ONETOUCH VERIO) w/Device KIT 1 each by Does not apply route 2 (two) times daily. Use as instructed twice daily, check fasting sugar in morning and check before bedtime. E11.8 10/03/17   [name], MD  clopidogrel (PLAVIX) 75 MG tablet Take 1  tablet (75 mg total) by mouth daily. 01/19/21 01/19/22  [name], MD  collagenase (SANTYL) ointment Apply 1 application topically daily. 05/22/20   Felipa Furnace, DPM  collagenase (SANTYL) ointment Apply 1 application topically daily. Apply Santyl Ointment to scab on left 3rd and 4th digits. Measurements: Left 3rd toe 1.0 x 0.6 cm Left 4th toe: 2.0 x 1.0 cm 05/22/20   Felipa Furnace, DPM  Continuous Blood Gluc Sensor (FREESTYLE LIBRE 2 SENSOR) MISC 1 Device by Does not apply route every 14 (fourteen) days. 07/01/21   Shamleffer, Melanie Crazier, MD  Empagliflozin-metFORMIN HCl 12.07-998 MG TABS Take 2 tablets by mouth daily in the afternoon. 09/29/21   Shamleffer, Melanie Crazier, MD  gabapentin (NEURONTIN) 300 MG capsule TAKE 1 CAPSULE TWICE DAILY 09/29/20   [name], MD  gabapentin (NEURONTIN) 800 MG tablet Take 1 tablet by mouth 2 (two) times daily. 07/03/20   [provider]  glucose blood test strip Use as instructed twice daily, check fasting sugar in morning and check before bedtime. E11.8 10/03/17   [name], MD  insulin NPH-regular Human (NOVOLIN 70/30) (70-30) 100 UNIT/ML injection Inject 12 Units into the skin 2 (two) times daily with a meal. Patient taking differently: Inject 6 Units into the skin 2 (two) times daily with a meal. 07/22/20   [name], MD  Insulin Syringe-Needle U-100 (TRUEPLUS INSULIN SYRINGE) 31G X 5/16" 0.3 ML  MISC Use as instructed to inject insulin daily. 12/31/19   [name], MD  lisinopril (ZESTRIL) 10 MG tablet Take 1 tablet by mouth daily at 6 (six) AM. 08/24/21   [provider]  Menthol-Methyl Salicylate (THERA-GESIC) 0.5-15 % CREA Apply topically. 08/14/20   [provider]  metoprolol tartrate (LOPRESSOR) 50 MG tablet Take 1 tablet (50 mg total) by mouth 2 (two) times daily. 07/22/21   [name], MD  MODERNA COVID-19 VACCINE 100 MCG/0.5ML injection  06/23/20   [provider]  mupirocin ointment  (BACTROBAN) 2 % Apply topically. 07/03/20   [provider]  ondansetron (ZOFRAN) 4 MG tablet Take 1 tablet (4 mg total) by mouth every 8 (eight) hours as needed for nausea or vomiting. 11/24/19   Edrick Kins, DPM  ONETOUCH DELICA LANCETS 74B MISC Use as instructed twice daily, check fasting sugar in morning and check before bedtime. E11.8 10/03/17   [name], MD  rosuvastatin (CRESTOR) 40 MG tablet Take 1 tablet (40 mg total) by mouth daily. To lower cholesterol 07/01/20   [name], MD  vitamin B-12 (CYANOCOBALAMIN) 500 MCG tablet TAKE ONE TABLET BY MOUTH DAILY FOR ANEMIA 08/24/21   [provider]      Allergies    Percocet [oxycodone-acetaminophen]    Review of Systems   Review of Systems  Respiratory:  Positive for shortness of breath.   All other systems reviewed and are negative.   Physical Exam Updated Vital Signs BP (!) 122/109   Pulse (!) 108   Temp 97.6 F (36.4 C)   Resp 16   Ht '5\' 7"'  (1.702 m)   Wt 83.5 kg   SpO2 100%   BMI 28.83 kg/m   Physical Exam Vitals and nursing note reviewed.  Constitutional:      Appearance: He is well-developed.  HENT:     Head: Normocephalic and atraumatic.  Eyes:     Conjunctiva/sclera: Conjunctivae normal.     Pupils: Pupils are equal, round, and reactive to light.  Neck:     Comments: No JVD Cardiovascular:     Rate and Rhythm: Normal rate and regular rhythm.     Heart sounds: Normal heart sounds.  Pulmonary:     Effort: Pulmonary effort is normal.     Breath sounds: Normal breath sounds. No wheezing or rhonchi.     Comments: Lungs grossly clear, no audible rales, speaking in sentences without difficulty Abdominal:     General: Bowel sounds are normal.     Palpations: Abdomen is soft.  Musculoskeletal:        General: Normal range of motion.     Cervical back: Normal range of motion.     Comments: No LE edema present, pulses intact BLE  Skin:    General: Skin is warm and dry.  Neurological:      Mental Status: He is alert and oriented to person, place, and time.     ED Results / Procedures / Treatments   Labs (all labs ordered are listed, but only abnormal results are displayed) Labs Reviewed  BASIC METABOLIC PANEL - Abnormal; Notable for the following components:      Result Value   Glucose, Bld 130 (*)    Creatinine, Ser 1.38 (*)    Calcium 8.7 (*)    GFR, Estimated 55 (*)    All other components within normal limits  CBC WITH DIFFERENTIAL/PLATELET - Abnormal; Notable for the following components:   RDW 16.3 (*)    All  other components within normal limits  BRAIN NATRIURETIC PEPTIDE - Abnormal; Notable for the following components:   B Natriuretic Peptide 1,165.9 (*)    All other components within normal limits  CBG MONITORING, ED  TROPONIN I (HIGH SENSITIVITY)    EKG None  Radiology DG Chest 2 View  Result Date: 10/27/2021 CLINICAL DATA:  sob EXAM: CHEST - 2 VIEW COMPARISON:  Aug 12, 2012 FINDINGS: Mild cardiomegaly. Thoracic aorta is ectatic. There is bilateral perihilar stranding and peribronchial cuffing seen. No consolidation or pleural effusion. Moderate thoracic spondylosis. IMPRESSION: Mild cardiomegaly. There is perihilar stranding and perihilar cuffing seen of likely bronchitis. Superimposed infiltrations cannot be excluded. Electronically Signed   By: Frazier Richards M.D.   On: 10/27/2021 17:49    Procedures Procedures  {Document cardiac monitor, telemetry assessment procedure when appropriate:1}  Medications Ordered in ED Medications - No data to display  ED Course/ Medical Decision Making/ A&P                           Medical Decision Making Amount and/or Complexity of Data Reviewed Radiology: ordered.   ***  {Document critical care time when appropriate:1} {Document review of labs and clinical decision tools ie heart score, Chads2Vasc2 etc:1}  {Document your independent review of radiology images, and any outside records:1} {Document  your discussion with family members, caretakers, and with consultants:1} {Document social determinants of health affecting pt's care:1} {Document your decision making why or why not admission, treatments were needed:1} Final Clinical Impression(s) / ED Diagnoses Final diagnoses:  None    Rx / DC Orders ED Discharge Orders     None

## 2021-10-28 ENCOUNTER — Inpatient Hospital Stay (HOSPITAL_COMMUNITY): Payer: No Typology Code available for payment source

## 2021-10-28 ENCOUNTER — Encounter (HOSPITAL_COMMUNITY): Payer: Self-pay | Admitting: Internal Medicine

## 2021-10-28 ENCOUNTER — Emergency Department (HOSPITAL_COMMUNITY): Payer: No Typology Code available for payment source

## 2021-10-28 ENCOUNTER — Other Ambulatory Visit (HOSPITAL_COMMUNITY): Payer: No Typology Code available for payment source

## 2021-10-28 DIAGNOSIS — Z79899 Other long term (current) drug therapy: Secondary | ICD-10-CM | POA: Diagnosis not present

## 2021-10-28 DIAGNOSIS — E1122 Type 2 diabetes mellitus with diabetic chronic kidney disease: Secondary | ICD-10-CM | POA: Diagnosis present

## 2021-10-28 DIAGNOSIS — I739 Peripheral vascular disease, unspecified: Secondary | ICD-10-CM | POA: Diagnosis not present

## 2021-10-28 DIAGNOSIS — E1169 Type 2 diabetes mellitus with other specified complication: Secondary | ICD-10-CM | POA: Diagnosis present

## 2021-10-28 DIAGNOSIS — E7800 Pure hypercholesterolemia, unspecified: Secondary | ICD-10-CM | POA: Diagnosis present

## 2021-10-28 DIAGNOSIS — Z833 Family history of diabetes mellitus: Secondary | ICD-10-CM | POA: Diagnosis not present

## 2021-10-28 DIAGNOSIS — Z811 Family history of alcohol abuse and dependence: Secondary | ICD-10-CM | POA: Diagnosis not present

## 2021-10-28 DIAGNOSIS — Z7982 Long term (current) use of aspirin: Secondary | ICD-10-CM | POA: Diagnosis not present

## 2021-10-28 DIAGNOSIS — I428 Other cardiomyopathies: Secondary | ICD-10-CM | POA: Diagnosis present

## 2021-10-28 DIAGNOSIS — I5043 Acute on chronic combined systolic (congestive) and diastolic (congestive) heart failure: Secondary | ICD-10-CM | POA: Diagnosis present

## 2021-10-28 DIAGNOSIS — N1831 Chronic kidney disease, stage 3a: Secondary | ICD-10-CM | POA: Diagnosis present

## 2021-10-28 DIAGNOSIS — Z7984 Long term (current) use of oral hypoglycemic drugs: Secondary | ICD-10-CM | POA: Diagnosis not present

## 2021-10-28 DIAGNOSIS — J9811 Atelectasis: Secondary | ICD-10-CM | POA: Diagnosis present

## 2021-10-28 DIAGNOSIS — R7989 Other specified abnormal findings of blood chemistry: Secondary | ICD-10-CM | POA: Diagnosis not present

## 2021-10-28 DIAGNOSIS — Z885 Allergy status to narcotic agent status: Secondary | ICD-10-CM | POA: Diagnosis not present

## 2021-10-28 DIAGNOSIS — Z794 Long term (current) use of insulin: Secondary | ICD-10-CM | POA: Diagnosis not present

## 2021-10-28 DIAGNOSIS — I5023 Acute on chronic systolic (congestive) heart failure: Secondary | ICD-10-CM

## 2021-10-28 DIAGNOSIS — Z95820 Peripheral vascular angioplasty status with implants and grafts: Secondary | ICD-10-CM | POA: Diagnosis not present

## 2021-10-28 DIAGNOSIS — I160 Hypertensive urgency: Secondary | ICD-10-CM | POA: Diagnosis present

## 2021-10-28 DIAGNOSIS — E1151 Type 2 diabetes mellitus with diabetic peripheral angiopathy without gangrene: Secondary | ICD-10-CM | POA: Diagnosis present

## 2021-10-28 DIAGNOSIS — I509 Heart failure, unspecified: Secondary | ICD-10-CM | POA: Diagnosis not present

## 2021-10-28 DIAGNOSIS — E1140 Type 2 diabetes mellitus with diabetic neuropathy, unspecified: Secondary | ICD-10-CM | POA: Diagnosis present

## 2021-10-28 DIAGNOSIS — I5033 Acute on chronic diastolic (congestive) heart failure: Secondary | ICD-10-CM | POA: Diagnosis not present

## 2021-10-28 DIAGNOSIS — Z7902 Long term (current) use of antithrombotics/antiplatelets: Secondary | ICD-10-CM | POA: Diagnosis not present

## 2021-10-28 DIAGNOSIS — R0602 Shortness of breath: Secondary | ICD-10-CM | POA: Diagnosis not present

## 2021-10-28 DIAGNOSIS — I350 Nonrheumatic aortic (valve) stenosis: Secondary | ICD-10-CM | POA: Diagnosis present

## 2021-10-28 DIAGNOSIS — Z8249 Family history of ischemic heart disease and other diseases of the circulatory system: Secondary | ICD-10-CM | POA: Diagnosis not present

## 2021-10-28 DIAGNOSIS — I5022 Chronic systolic (congestive) heart failure: Secondary | ICD-10-CM | POA: Diagnosis present

## 2021-10-28 DIAGNOSIS — N179 Acute kidney failure, unspecified: Secondary | ICD-10-CM | POA: Diagnosis not present

## 2021-10-28 DIAGNOSIS — I130 Hypertensive heart and chronic kidney disease with heart failure and stage 1 through stage 4 chronic kidney disease, or unspecified chronic kidney disease: Secondary | ICD-10-CM | POA: Diagnosis present

## 2021-10-28 DIAGNOSIS — I2510 Atherosclerotic heart disease of native coronary artery without angina pectoris: Secondary | ICD-10-CM | POA: Diagnosis present

## 2021-10-28 LAB — CREATININE, SERUM
Creatinine, Ser: 1.35 mg/dL — ABNORMAL HIGH (ref 0.61–1.24)
GFR, Estimated: 56 mL/min — ABNORMAL LOW (ref >60)

## 2021-10-28 LAB — TSH: TSH: 2.565 u[IU]/mL (ref 0.350–4.500)

## 2021-10-28 LAB — CBG MONITORING, ED
Glucose-Capillary: 126 mg/dL — ABNORMAL HIGH (ref 70–99)
Glucose-Capillary: 133 mg/dL — ABNORMAL HIGH (ref 70–99)
Glucose-Capillary: 166 mg/dL — ABNORMAL HIGH (ref 70–99)
Glucose-Capillary: 129 mg/dL — ABNORMAL HIGH (ref 70–99)

## 2021-10-28 LAB — CBC
HCT: 40.1 % (ref 39.0–52.0)
Hemoglobin: 12.9 g/dL — ABNORMAL LOW (ref 13.0–17.0)
MCH: 27.3 pg (ref 26.0–34.0)
MCHC: 32.2 g/dL (ref 30.0–36.0)
MCV: 85 fL (ref 80.0–100.0)
Platelets: 324 10*3/uL (ref 150–400)
RBC: 4.72 MIL/uL (ref 4.22–5.81)
RDW: 16.4 % — ABNORMAL HIGH (ref 11.5–15.5)
WBC: 6 10*3/uL (ref 4.0–10.5)
nRBC: 0 % (ref 0.0–0.2)

## 2021-10-28 LAB — TROPONIN I (HIGH SENSITIVITY)
Troponin I (High Sensitivity): 79 ng/L — ABNORMAL HIGH (ref <18)
Troponin I (High Sensitivity): 84 ng/L — ABNORMAL HIGH (ref <18)

## 2021-10-28 LAB — MAGNESIUM: Magnesium: 1.9 mg/dL (ref 1.7–2.4)

## 2021-10-28 LAB — GLUCOSE, CAPILLARY
Glucose-Capillary: 186 mg/dL — ABNORMAL HIGH (ref 70–99)
Glucose-Capillary: 172 mg/dL — ABNORMAL HIGH (ref 70–99)

## 2021-10-28 MED ORDER — FUROSEMIDE 10 MG/ML IJ SOLN
40.0000 mg | INTRAMUSCULAR | Status: AC
  Administered 2021-10-28: 40 mg via INTRAVENOUS
  Filled 2021-10-28: qty 4

## 2021-10-28 MED ORDER — LISINOPRIL 10 MG PO TABS
10.0000 mg | ORAL_TABLET | Freq: Every day | ORAL | Status: DC
  Administered 2021-10-28 – 2021-10-29 (×2): 10 mg via ORAL
  Filled 2021-10-28 (×2): qty 1

## 2021-10-28 MED ORDER — INSULIN ASPART PROT & ASPART (70-30 MIX) 100 UNIT/ML ~~LOC~~ SUSP
7.0000 [IU] | Freq: Every day | SUBCUTANEOUS | Status: DC
Start: 2021-10-28 — End: 2021-10-30
  Administered 2021-10-28 – 2021-10-30 (×3): 7 [IU] via SUBCUTANEOUS
  Filled 2021-10-28: qty 10

## 2021-10-28 MED ORDER — ENOXAPARIN SODIUM 40 MG/0.4ML IJ SOSY
40.0000 mg | PREFILLED_SYRINGE | Freq: Every day | INTRAMUSCULAR | Status: DC
  Administered 2021-10-28 – 2021-10-29 (×2): 40 mg via SUBCUTANEOUS
  Filled 2021-10-28: qty 0.4

## 2021-10-28 MED ORDER — GABAPENTIN 400 MG PO CAPS
800.0000 mg | ORAL_CAPSULE | Freq: Every day | ORAL | Status: DC
  Administered 2021-10-28 – 2021-10-29 (×2): 800 mg via ORAL
  Filled 2021-10-28 (×2): qty 2

## 2021-10-28 MED ORDER — IOHEXOL 350 MG/ML SOLN
72.0000 mL | Freq: Once | INTRAVENOUS | Status: AC | PRN
  Administered 2021-10-28: 72 mL via INTRAVENOUS

## 2021-10-28 MED ORDER — ATORVASTATIN CALCIUM 40 MG PO TABS
40.0000 mg | ORAL_TABLET | Freq: Every day | ORAL | Status: DC
  Administered 2021-10-28 – 2021-10-30 (×3): 40 mg via ORAL
  Filled 2021-10-28 (×3): qty 1

## 2021-10-28 MED ORDER — ASPIRIN 81 MG PO TBEC
81.0000 mg | DELAYED_RELEASE_TABLET | Freq: Every day | ORAL | Status: DC
  Administered 2021-10-28 – 2021-10-30 (×3): 81 mg via ORAL
  Filled 2021-10-28 (×3): qty 1

## 2021-10-28 MED ORDER — INSULIN ASPART 100 UNIT/ML IJ SOLN
0.0000 [IU] | Freq: Three times a day (TID) | INTRAMUSCULAR | Status: DC
  Administered 2021-10-28: 2 [IU] via SUBCUTANEOUS
  Administered 2021-10-28 (×2): 1 [IU] via SUBCUTANEOUS
  Administered 2021-10-29 (×2): 2 [IU] via SUBCUTANEOUS
  Administered 2021-10-30: 1 [IU] via SUBCUTANEOUS
  Administered 2021-10-30: 2 [IU] via SUBCUTANEOUS

## 2021-10-28 MED ORDER — FUROSEMIDE 10 MG/ML IJ SOLN
40.0000 mg | Freq: Two times a day (BID) | INTRAMUSCULAR | Status: DC
  Administered 2021-10-28 (×2): 40 mg via INTRAVENOUS
  Filled 2021-10-28 (×2): qty 4

## 2021-10-28 MED ORDER — CLOPIDOGREL BISULFATE 75 MG PO TABS
75.0000 mg | ORAL_TABLET | Freq: Every day | ORAL | Status: DC
  Administered 2021-10-28 – 2021-10-30 (×3): 75 mg via ORAL
  Filled 2021-10-28 (×3): qty 1

## 2021-10-28 MED ORDER — METOPROLOL TARTRATE 50 MG PO TABS
50.0000 mg | ORAL_TABLET | Freq: Two times a day (BID) | ORAL | Status: DC
  Administered 2021-10-28 – 2021-10-29 (×3): 50 mg via ORAL
  Filled 2021-10-28: qty 2
  Filled 2021-10-28 (×2): qty 1

## 2021-10-28 MED ORDER — INSULIN ASPART PROT & ASPART (70-30 MIX) 100 UNIT/ML ~~LOC~~ SUSP
8.0000 [IU] | Freq: Every day | SUBCUTANEOUS | Status: DC
  Administered 2021-10-29: 8 [IU] via SUBCUTANEOUS
  Filled 2021-10-28: qty 10

## 2021-10-28 MED ORDER — ACETAMINOPHEN 650 MG RE SUPP: 650.0000 mg | Freq: Four times a day (QID) | RECTAL | Status: DC | PRN

## 2021-10-28 MED ORDER — ACETAMINOPHEN 325 MG PO TABS: 650.0000 mg | ORAL_TABLET | Freq: Four times a day (QID) | ORAL | Status: DC | PRN

## 2021-10-28 NOTE — Hospital Course (Addendum)
Caleb [name] was admitted to the hospital with the working of decompensated heart failure.   71 year old M with HTN, aortic stenosis, peripheral vascular disease status popliteal and carotid stents who presented with 3 days shortness of breath, chest tightness after spending 48 hours driving to [place] and then [place] and then home. On his initial physical examination his blood pressure was 176/113, HR 104, RR 22 and 02 saturation 98% on supplemental 02. Lungs with no wheezing or rhonchi, heart with S1 and S2 present and rhythmic, positive systolic murmur at the base, abdomen not distended, no lower extremity edema.   Na 139, K 4,0 Cl 108, bicarbonate 23, glucose 130, bun 16 cr 1,38  BNP 1,165  High sensitive troponin 84 and 79 Wbc 6.0 hgb 12,9 plt 324   Chest radiograph with mild cardiomegaly, bilateral hilar vascular congestion and cephalization of the vasculature. Fluid in the right fissure.  Ct chest with bilateral ground glass opacities, small to moderate right pleural effusion with compression atelectasis.  No pulmonary embolism. Calcification of the aortic valve.   EKG 96 bpm, right axis deviation, normal intervals, sinus rhythm , with PAC, no significant ST segment or T wave changes, positive LVH.   Patient was placed on IV furosemide for diuresis. Further work up with echocardiogram showed reduction in LV systolic function, EF 78% and severe aortic stenosis.   Cardiac catheterization with coronary artery disease but non ischemic cardiomyopathy. Severe aortic stenosis.  Patient will follow up as outpatient for possible TAVR.

## 2021-10-28 NOTE — ED Notes (Signed)
Patient transported to CT 

## 2021-10-28 NOTE — ED Notes (Signed)
Pt in hallway A&O x4. Denies any pain at this time. Updated on plan of care. Breakfast at bedside.

## 2021-10-28 NOTE — Progress Notes (Signed)
Heart Failure Navigator Progress Note  Following this hospitalization to assess for HV TOC readiness.   Echo pending?  Neera Teng, BSN, RN Heart Failure Nurse Navigator Secure Chat Only  

## 2021-10-28 NOTE — ED Notes (Signed)
Patient transported to echo ?

## 2021-10-28 NOTE — Progress Notes (Signed)
  Progress Note   Patient: Caleb [name] FFM:[id number] DOB: [birth year] DOA: 10/27/2021     0 DOS: the patient was seen and examined on 10/28/2021        Brief hospital course: Caleb [name] is a 71 year old M with HTN, aortic stenosis, peripheral vascular disease status popliteal and carotid stents who presented with 3 days shortness of breath, chest tightness after spending 48 hours driving to [place] and then [place] and then home.  In the ER, chest angiogram showed pulmonary edema, no pulmonary embolism.  Blood pressure was markedly elevated, BNP was >1000.  He was started on Lasix and admitted.       Assessment and Plan: * Acute on chronic diastolic CHF (congestive heart failure) (HCC) Improving with diuresis, making good urine output.  However still desaturating with ambulation and feels "winded". - Continue IV Lasix - Continue strict ins and outs, daily weights, daily BMP - Obtain echocardiogram  Hypertensive urgency Blood pressure 198 on admission, he reports his baseline is around 160.  It is improved to baseline with resumption of his home medicines - Continue lisinopril and metoprolol  Type 2 diabetes mellitus with hyperglycemia, with long-term current use of insulin (HCC) Glucose controlled.  Hemoglobin A1c 8% 1 month ago - Continue 70/30 - Sliding scale corrections  Peripheral vascular disease (HCC) - Continue aspirin and atorvastatin and Plavix          Subjective: Patient winded with ambulation.     Physical Exam: Vitals:   10/28/21 0300 10/28/21 0541 10/28/21 1045 10/28/21 1116  BP: (!) 169/106 135/88 (!) 153/107 (!) 162/113  Pulse: 95 88 91 99  Resp: (!) '21 18 17 19  '$ Temp:  98.1 F (36.7 C) 98.2 F (36.8 C) 97.6 F (36.4 C)  TempSrc:  Oral Oral Oral  SpO2: 98% 99% 100% 100%  Weight:      Height:         Medical decision making This is a no charge note, feels see the H&P by Dr. Hal Hope from earlier today     Disposition: Status is:  Inpatient Patient presented with CHF symptoms.  Likely hypertension mediated.  Will obtain echocardiogram to evaluate aortic valve.        Author: Edwin Dada, MD 10/28/2021 11:36 AM  For on call review [email or web address].

## 2021-10-28 NOTE — Assessment & Plan Note (Addendum)
No acute decompensation. Continue with aspirin, clopidogrel and statin therapy. Continue blood pressure control.

## 2021-10-28 NOTE — H&P (Signed)
History and Physical    [name] PQD:[id number] DOB: July 18, 1950 DOA: 10/27/2021  PCP: [name], MD  Patient coming from: Home.  Chief Complaint: Shortness of breath.  HPI: Caleb [name] is a 71 y.o. male with history of hypertension, moderate aortic stenosis, peripheral vascular disease status post stent placement history of carotid artery occlusion status post stent placement, chronic kidney disease stage II has been experiencing increasing exertional shortness of breath over the last 2 weeks.  Patient states he usually is able to walk on the treadmill and is very active but over the last 2 weeks he is not able to do minimal exertion and gets very short of breath.  Denies chest pain productive cough fever or chills.  Patient had gone to [place] and was referred to the ER.  ED Course: In the ER patient blood pressures found to be markedly elevated with systolic more than 940 and diastolic more than 768.  CT angiogram of the chest shows features consistent with CHF with right sided pleural effusion and calcification of the aortic valve.  BNP was markedly elevated at 1100 high sensitive troponins were 84 and 79.  Patient was given Lasix 40 mg IV admitted for further workup.  Review of Systems: As per HPI, rest all negative.   Past Medical History:  Diagnosis Date   Aortic stenosis    Carotid artery occlusion    Diabetes mellitus without complication ([place])    Dyslipidemia 09/27/2012   Erectile dysfunction 09/27/2012   Heart murmur    Hypercholesteremia    Hyperlipidemia 08/12/2012   Hypertension    Hyponatremia 08/14/2012   Neuropathy    Pancreatitis    Pancreatitis, acute 09/27/2012   Peripheral vascular disease ([place])    Vitamin D deficiency 06/23/2015    Past Surgical History:  Procedure Laterality Date   ABDOMINAL AORTOGRAM W/LOWER EXTREMITY N/A 08/08/2019   Procedure: ABDOMINAL AORTOGRAM W/LOWER EXTREMITY;  Surgeon: Lorretta Harp, MD;  Location: [facility];   Service: Cardiovascular;  Laterality: N/A;   ABDOMINAL AORTOGRAM W/LOWER EXTREMITY N/A 11/18/2019   Procedure: ABDOMINAL AORTOGRAM W/LOWER EXTREMITY;  Surgeon: Lorretta Harp, MD;  Location: [facility];  Service: Cardiovascular;  Laterality: N/A;   COLONOSCOPY  12 years ago   in [place] clinic= normal exam per pt   KNEE SURGERY     PERIPHERAL VASCULAR INTERVENTION Right 08/08/2019   Procedure: PERIPHERAL VASCULAR INTERVENTION;  Surgeon: Lorretta Harp, MD;  Location: [facility];  Service: Cardiovascular;  Laterality: Right;   PERIPHERAL VASCULAR INTERVENTION Left 11/18/2019   Procedure: PERIPHERAL VASCULAR INTERVENTION;  Surgeon: Lorretta Harp, MD;  Location: [facility];  Service: Cardiovascular;  Laterality: Left;  left popliteal artery   TRANSCAROTID ARTERY REVASCULARIZATION  Left 12/16/2019   Procedure: TRANSCAROTID ARTERY REVASCULARIZATION;  Surgeon: Elam Dutch, MD;  Location: [facility];  Service: Vascular;  Laterality: Left;   TRANSCAROTID ARTERY REVASCULARIZATION  Right 02/03/2020   Procedure: RIGHT TRANSCAROTID ARTERY REVASCULARIZATION;  Surgeon: Elam Dutch, MD;  Location: [facility];  Service: Vascular;  Laterality: Right;   ULTRASOUND GUIDANCE FOR VASCULAR ACCESS Right 12/16/2019   Procedure: ULTRASOUND GUIDANCE FOR VASCULAR ACCESS;  Surgeon: Elam Dutch, MD;  Location: [facility];  Service: Vascular;  Laterality: Right;   ULTRASOUND GUIDANCE FOR VASCULAR ACCESS Right 02/03/2020   Procedure: ULTRASOUND GUIDANCE FOR VASCULAR ACCESS;  Surgeon: Elam Dutch, MD;  Location: [facility];  Service: Vascular;  Laterality: Right;   VASECTOMY  reports that he has never smoked. He has never used smokeless tobacco. He reports that he does not currently use alcohol. He reports that he does not use drugs.  Allergies  Allergen Reactions   Percocet [Oxycodone-Acetaminophen] Nausea And Vomiting    10/325. Pt has 3 doses of this medication. N/V, temperature  97, no chills and slight sweating. Pt dressing was C/D/I no drainage noted.     Family History  Problem Relation Age of Onset   CAD Father    Hypertension Father    Alcohol abuse Father        Cause of death   Diabetes Mother    Colon polyps Mother    CAD Brother 66       CABG   Colon cancer Neg Hx    Esophageal cancer Neg Hx    Rectal cancer Neg Hx    Stomach cancer Neg Hx     Prior to Admission medications   Medication Sig Start Date End Date Taking? Authorizing Provider  amLODipine (NORVASC) 5 MG tablet Take 1 tablet (5 mg total) by mouth daily. 07/22/21   [name], MD  aspirin EC 81 MG tablet Take 81 mg by mouth daily.    [provider]  atorvastatin (LIPITOR) 40 MG tablet Take by mouth. 07/03/20   [provider]  Blood Glucose Monitoring Suppl (ONETOUCH VERIO) w/Device KIT 1 each by Does not apply route 2 (two) times daily. Use as instructed twice daily, check fasting sugar in morning and check before bedtime. E11.8 10/03/17   [name], MD  clopidogrel (PLAVIX) 75 MG tablet Take 1 tablet (75 mg total) by mouth daily. 01/19/21 01/19/22  [name], MD  collagenase (SANTYL) ointment Apply 1 application topically daily. 05/22/20   Felipa Furnace, DPM  collagenase (SANTYL) ointment Apply 1 application topically daily. Apply Santyl Ointment to scab on left 3rd and 4th digits. Measurements: Left 3rd toe 1.0 x 0.6 cm Left 4th toe: 2.0 x 1.0 cm 05/22/20   Felipa Furnace, DPM  Continuous Blood Gluc Sensor (FREESTYLE LIBRE 2 SENSOR) MISC 1 Device by Does not apply route every 14 (fourteen) days. 07/01/21   Shamleffer, Melanie Crazier, MD  Empagliflozin-metFORMIN HCl 12.07-998 MG TABS Take 2 tablets by mouth daily in the afternoon. 09/29/21   Shamleffer, Melanie Crazier, MD  gabapentin (NEURONTIN) 300 MG capsule TAKE 1 CAPSULE TWICE DAILY 09/29/20   [name], MD  gabapentin (NEURONTIN) 800 MG tablet Take 1 tablet by mouth 2 (two) times daily. 07/03/20    [provider]  glucose blood test strip Use as instructed twice daily, check fasting sugar in morning and check before bedtime. E11.8 10/03/17   [name], MD  insulin NPH-regular Human (NOVOLIN 70/30) (70-30) 100 UNIT/ML injection Inject 12 Units into the skin 2 (two) times daily with a meal. Patient taking differently: Inject 6 Units into the skin 2 (two) times daily with a meal. 07/22/20   [name], MD  Insulin Syringe-Needle U-100 (TRUEPLUS INSULIN SYRINGE) 31G X 5/16" 0.3 ML MISC Use as instructed to inject insulin daily. 12/31/19   [name], MD  lisinopril (ZESTRIL) 10 MG tablet Take 1 tablet by mouth daily at 6 (six) AM. 08/24/21   [provider]  Menthol-Methyl Salicylate (THERA-GESIC) 0.5-15 % CREA Apply topically. 08/14/20   [provider]  metoprolol tartrate (LOPRESSOR) 50 MG tablet Take 1 tablet (50 mg total) by mouth 2 (two) times daily. 07/22/21   [name], MD  MODERNA COVID-19 VACCINE  100 MCG/0.5ML injection  06/23/20   [provider]  mupirocin ointment (BACTROBAN) 2 % Apply topically. 07/03/20   [provider]  ondansetron (ZOFRAN) 4 MG tablet Take 1 tablet (4 mg total) by mouth every 8 (eight) hours as needed for nausea or vomiting. 11/24/19   Edrick Kins, DPM  ONETOUCH DELICA LANCETS 78L MISC Use as instructed twice daily, check fasting sugar in morning and check before bedtime. E11.8 10/03/17   [name], MD  rosuvastatin (CRESTOR) 40 MG tablet Take 1 tablet (40 mg total) by mouth daily. To lower cholesterol 07/01/20   [name], MD  vitamin B-12 (CYANOCOBALAMIN) 500 MCG tablet TAKE ONE TABLET BY MOUTH DAILY FOR ANEMIA 08/24/21   [provider]    Physical Exam: Constitutional: Moderately built and nourished. Vitals:   10/28/21 0211 10/28/21 0213 10/28/21 0218 10/28/21 0300  BP: (!) 176/113   (!) 169/106  Pulse: (!) 104 (!) 105  95  Resp: (!) 22 17  (!) 21  Temp:   (!) 97.5 F (36.4 C)    TempSrc:   Oral   SpO2: 100% 100%  98%  Weight:      Height:       Eyes: Anicteric no pallor. ENMT: No discharge from the ears eyes nose and mouth. Neck: JVD elevated no mass felt. Respiratory: No rhonchi or crepitations. Cardiovascular: S1-S2 heard.  Systolic murmur. Abdomen: Soft nontender bowel sounds present. Musculoskeletal: No edema. Skin: No rash. Neurologic: Alert awake oriented time place and person.  Moves all extremities. Psychiatric: Appears normal.  Normal affect.   Labs on Admission: I have personally reviewed following labs and imaging studies  CBC: Recent Labs  Lab 10/27/21 1707  WBC 5.6  NEUTROABS 2.8  HGB 13.3  HCT 41.4  MCV 85.0  PLT 381   Basic Metabolic Panel: Recent Labs  Lab 10/27/21 1707  NA 139  K 4.0  CL 108  CO2 23  GLUCOSE 130*  BUN 16  CREATININE 1.38*  CALCIUM 8.7*   GFR: Estimated Creatinine Clearance: 50.8 mL/min (A) (by C-G formula based on SCr of 1.38 mg/dL (H)). Liver Function Tests: No results for input(s): "AST", "ALT", "ALKPHOS", "BILITOT", "PROT", "ALBUMIN" in the last 168 hours. No results for input(s): "LIPASE", "AMYLASE" in the last 168 hours. No results for input(s): "AMMONIA" in the last 168 hours. Coagulation Profile: No results for input(s): "INR", "PROTIME" in the last 168 hours. Cardiac Enzymes: No results for input(s): "CKTOTAL", "CKMB", "CKMBINDEX", "TROPONINI" in the last 168 hours. BNP (last 3 results) No results for input(s): "PROBNP" in the last 8760 hours. HbA1C: No results for input(s): "HGBA1C" in the last 72 hours. CBG: Recent Labs  Lab 10/27/21 1920 10/28/21 0148  GLUCAP 97 126*   Lipid Profile: No results for input(s): "CHOL", "HDL", "LDLCALC", "TRIG", "CHOLHDL", "LDLDIRECT" in the last 72 hours. Thyroid Function Tests: No results for input(s): "TSH", "T4TOTAL", "FREET4", "T3FREE", "THYROIDAB" in the last 72 hours. Anemia Panel: No results for input(s): "VITAMINB12", "FOLATE",  "FERRITIN", "TIBC", "IRON", "RETICCTPCT" in the last 72 hours. Urine analysis:    Component Value Date/Time   COLORURINE YELLOW 01/30/2020 1130   APPEARANCEUR CLEAR 01/30/2020 1130   LABSPEC 1.015 01/30/2020 1130   PHURINE 5.0 01/30/2020 1130   GLUCOSEU >=500 (A) 01/30/2020 1130   HGBUR SMALL (A) 01/30/2020 1130   BILIRUBINUR NEGATIVE 01/30/2020 1130   BILIRUBINUR neg 06/09/2015 1553   KETONESUR NEGATIVE 01/30/2020 1130   PROTEINUR 100 (A) 01/30/2020 1130   UROBILINOGEN 0.2 06/09/2015 1553  UROBILINOGEN 0.2 08/12/2012 1613   NITRITE NEGATIVE 01/30/2020 1130   LEUKOCYTESUR NEGATIVE 01/30/2020 1130   Sepsis Labs: _0 (procalcitonin:4,lacticidven:4) )No results found for this or any previous visit (from the past 240 hour(s)).   Radiological Exams on Admission: CT Angio Chest PE W and/or Wo Contrast  Result Date: 10/28/2021 CLINICAL DATA:  Pulmonary embolism (PE) suspected, unknown D-dimer SOB, CHF, recent prolonged travel. EXAM: CT ANGIOGRAPHY CHEST WITH CONTRAST TECHNIQUE: Multidetector CT imaging of the chest was performed using the standard protocol during bolus administration of intravenous contrast. Multiplanar CT image reconstructions and MIPs were obtained to evaluate the vascular anatomy. RADIATION DOSE REDUCTION: This exam was performed according to the departmental dose-optimization program which includes automated exposure control, adjustment of the mA and/or kV according to patient size and/or use of iterative reconstruction technique. CONTRAST:  70m OMNIPAQUE IOHEXOL 350 MG/ML SOLN COMPARISON:  None Available. FINDINGS: Cardiovascular: There is adequate opacification of the pulmonary arterial tree. No intraluminal filling defect identified to suggest acute pulmonary embolism. Central pulmonary arteries are of normal caliber. Extensive multi-vessel coronary artery calcification. Mild global cardiomegaly. Calcification of the aortic valve leaflets noted. No pericardial  effusion. The thoracic aorta is not opacified but is of normal caliber. Mild atherosclerotic calcification. Mediastinum/Nodes: No enlarged mediastinal, hilar, or axillary lymph nodes. Thyroid gland, trachea, and esophagus demonstrate no significant findings. Lungs/Pleura: Moderate right and small left pleural effusions are present. There is thickening of the peribronchovascular interstitium, smooth interlobular septal thickening, and scattered peripheral nodular ground-glass opacities all suggesting mild-to-moderate interstitial pulmonary edema. Altogether, these findings suggest changes of mild cardiogenic failure. No pneumothorax. No central obstructing lesion. Upper Abdomen: No acute abnormality. Musculoskeletal: No acute abnormality. Review of the MIP images confirms the above findings. IMPRESSION: 1. No pulmonary embolism. 2. Mild global cardiomegaly. Extensive multi-vessel coronary artery calcification. 3. Calcification of the aortic valve leaflets. Echocardiography may be helpful for further evaluation, if indicated. 4. Moderate right and small left pleural effusions with associated pulmonary interstitial edema. Altogether, these findings suggest changes of mild cardiogenic failure. Aortic Atherosclerosis (ICD10-I70.0). Electronically Signed   By: AFidela SalisburyM.D.   On: 10/28/2021 01:22   DG Chest 2 View  Result Date: 10/27/2021 CLINICAL DATA:  sob EXAM: CHEST - 2 VIEW COMPARISON:  Aug 12, 2012 FINDINGS: Mild cardiomegaly. Thoracic aorta is ectatic. There is bilateral perihilar stranding and peribronchial cuffing seen. No consolidation or pleural effusion. Moderate thoracic spondylosis. IMPRESSION: Mild cardiomegaly. There is perihilar stranding and perihilar cuffing seen of likely bronchitis. Superimposed infiltrations cannot be excluded. Electronically Signed   By: AFrazier RichardsM.D.   On: 10/27/2021 17:49    EKG: Independently reviewed.  Normal sinus rhythm.  Assessment/Plan Principal Problem:    Acute on chronic diastolic CHF (congestive heart failure) (HCC) Active Problems:   Type 2 diabetes mellitus with hyperglycemia, with long-term current use of insulin (HCC)   Hypertensive urgency   Acute CHF (congestive heart failure) ([place]    Acute CHF -last EF measured was 50 to 55% with grade 1 diastolic dysfunction in October 2022.  At that time patient also had moderate aortic stenosis.  Patient also presented with hypertensive urgency.  Since patient has known history of aortic stenosis we will recheck 2D echo to see if there is any worsening.  Also control blood pressure closely follow intake output metabolic panel.  Will place patient on Lasix 40 mg IV every 12 hours continue patient's lisinopril and beta-blockers.  Likely may need cardiology input.  Since patient does have moderate pleural  effusion if symptoms does not improve may need thoracentesis. Hypertensive urgency -patient is presently on IV Lasix continue home dose of lisinopril beta-blockers.  Follow blood pressure trends.  Follow 2D echo. Diabetes mellitus type 2 will continue home dose of insulin with sliding scale coverage. Chronic kidney disease stage II creatinine appears to be at baseline.  Follow metabolic panel. Peripheral vascular disease with history of lower extremity stent placement and also carotid stents.  On aspirin Plavix and statins. Patient CT scan of the chest did show extensive multivessel coronary artery disease.  Will trend cardiac markers patient is on aspirin Plavix and statins.  Will await for cardiology input.  Since patient has new acute CHF with known history of moderate aortic stenosis and presentation of hypertensive urgency will need further management and close follow-up in inpatient status.   DVT prophylaxis: Lovenox. Code Status: Full code. Family Communication: Discussed with patient. Disposition Plan: Home. Consults called: Consult cardiology in the morning. Admission status:  Inpatient.   Rise Patience MD [facility] Pager [phone].  If 7PM-7AM, please contact night-coverage [email or web address] Password [email or web address]  10/28/2021, 4:16 AM

## 2021-10-28 NOTE — ED Notes (Signed)
Spo2 100% on room air at rest and dropped to 84% on room air while ambulating, HR 90 at rest and 125 while ambulating. Patient complaints "feeling wended" while ambulating.

## 2021-10-28 NOTE — Progress Notes (Signed)
MD paged on current BP, will continue to monitor, no change in treatment plan at this time, per MD follow-up. SRP, RN

## 2021-10-28 NOTE — Assessment & Plan Note (Addendum)
Patient has responded well to diuresis, he has been placed on heart failure medical regimen with good toleration.  At the time of his discharge his systolic blood pressure is 120 to 130 mmHg.   Plan to continue carvedilol, and diuretic therapy with furosemide and spironolactone.  To start on Entresto as outpatient.

## 2021-10-28 NOTE — Assessment & Plan Note (Signed)
Glucose controlled.  Hemoglobin A1c 8% 1 month ago - Continue 70/30 - Sliding scale corrections

## 2021-10-28 NOTE — Assessment & Plan Note (Addendum)
Echocardiogram with reduced LV systolic function, EF 71%, global hypokinesis, RV systolic function with moderate reduction in systolic function, not able to assess PA pressures. Aortic valve with moderate stenosis and mild to moderate regurgitation. Severe calcification of aortic valve. Trivial pericardial effusion.   08/11 cardiac catheterization with mild non obstructive disease mid to distal LAD. Mild non obstructive disease in the circumflex and ramus intermediate branches. RCA is a large dominant vessel with moderate proximal to mid stenosis that does not appear to be flow limiting.  Severe low flow/ low gradient aortic stenosis  Elevated right heart pressures.   Patient was treated with IV furosemide for diuresis, negative fluid balance was achieved, - 4,980 ml, with improvement in his symptoms.   Patient was evaluated by cardiology and recommendations for TAVR.  Patient will follow up as outpatient for aortic intervention.   Continue diuresis with furosemide, heart failure regimen with carvedilol, empagliflozin, spironolactone, and start Entresto on 11/01/21.  Follow up renal function on 11/03/21.

## 2021-10-28 NOTE — Assessment & Plan Note (Addendum)
Patient was placed on insulin therapy with good toleration.  His fasting glucose at the time of his discharge is 144.   Continue with statin therapy.

## 2021-10-29 ENCOUNTER — Encounter (HOSPITAL_COMMUNITY): Payer: Self-pay | Admitting: Internal Medicine

## 2021-10-29 ENCOUNTER — Encounter (HOSPITAL_COMMUNITY)
Admission: EM | Disposition: A | Discharge status: Home / Self Care | Payer: Self-pay | Source: Home / Self Care | Attending: Internal Medicine

## 2021-10-29 DIAGNOSIS — I350 Nonrheumatic aortic (valve) stenosis: Secondary | ICD-10-CM

## 2021-10-29 DIAGNOSIS — I5023 Acute on chronic systolic (congestive) heart failure: Secondary | ICD-10-CM

## 2021-10-29 HISTORY — PX: RIGHT/LEFT HEART CATH AND CORONARY ANGIOGRAPHY: CATH118266

## 2021-10-29 LAB — POCT I-STAT 7, (LYTES, BLD GAS, ICA,H+H)
Acid-Base Excess: 2 mmol/L (ref 0.0–2.0)
Bicarbonate: 26.3 mmol/L (ref 20.0–28.0)
Calcium, Ion: 1.13 mmol/L — ABNORMAL LOW (ref 1.15–1.40)
HCT: 41 % (ref 39.0–52.0)
Hemoglobin: 13.9 g/dL (ref 13.0–17.0)
O2 Saturation: 92 %
Potassium: 3.5 mmol/L (ref 3.5–5.1)
Sodium: 140 mmol/L (ref 135–145)
TCO2: 27 mmol/L (ref 22–32)
pCO2 arterial: 40.1 mmHg (ref 32–48)
pH, Arterial: 7.424 (ref 7.35–7.45)
pO2, Arterial: 63 mmHg — ABNORMAL LOW (ref 83–108)

## 2021-10-29 LAB — POCT I-STAT EG7
Acid-Base Excess: 4 mmol/L — ABNORMAL HIGH (ref 0.0–2.0)
Acid-Base Excess: 4 mmol/L — ABNORMAL HIGH (ref 0.0–2.0)
Bicarbonate: 30 mmol/L — ABNORMAL HIGH (ref 20.0–28.0)
Bicarbonate: 30.3 mmol/L — ABNORMAL HIGH (ref 20.0–28.0)
Calcium, Ion: 1.16 mmol/L (ref 1.15–1.40)
Calcium, Ion: 1.2 mmol/L (ref 1.15–1.40)
HCT: 42 % (ref 39.0–52.0)
HCT: 42 % (ref 39.0–52.0)
Hemoglobin: 14.3 g/dL (ref 13.0–17.0)
Hemoglobin: 14.3 g/dL (ref 13.0–17.0)
O2 Saturation: 41 %
O2 Saturation: 44 %
Potassium: 3.5 mmol/L (ref 3.5–5.1)
Potassium: 3.6 mmol/L (ref 3.5–5.1)
Sodium: 140 mmol/L (ref 135–145)
Sodium: 141 mmol/L (ref 135–145)
TCO2: 31 mmol/L (ref 22–32)
TCO2: 32 mmol/L (ref 22–32)
pCO2, Ven: 48.2 mmHg (ref 44–60)
pCO2, Ven: 52.1 mmHg (ref 44–60)
pH, Ven: 7.373 (ref 7.25–7.43)
pH, Ven: 7.403 (ref 7.25–7.43)
pO2, Ven: 24 mmHg — CL (ref 32–45)
pO2, Ven: 25 mmHg — CL (ref 32–45)

## 2021-10-29 LAB — BASIC METABOLIC PANEL
Anion gap: 10 (ref 5–15)
BUN: 25 mg/dL — ABNORMAL HIGH (ref 8–23)
CO2: 24 mmol/L (ref 22–32)
Calcium: 8.7 mg/dL — ABNORMAL LOW (ref 8.9–10.3)
Chloride: 104 mmol/L (ref 98–111)
Creatinine, Ser: 1.47 mg/dL — ABNORMAL HIGH (ref 0.61–1.24)
GFR, Estimated: 51 mL/min — ABNORMAL LOW (ref >60)
Glucose, Bld: 149 mg/dL — ABNORMAL HIGH (ref 70–99)
Potassium: 3.7 mmol/L (ref 3.5–5.1)
Sodium: 138 mmol/L (ref 135–145)

## 2021-10-29 LAB — ECHOCARDIOGRAM COMPLETE
AR max vel: 1 cm2
AV Area VTI: 0.83 cm2
AV Area mean vel: 0.9 cm2
AV Mean grad: 12.3 mmHg
AV Peak grad: 22.7 mmHg
Ao pk vel: 2.38 m/s
Area-P 1/2: 3.46 cm2
Height: 67 in
S' Lateral: 3.7 cm
Weight: 2945.35 oz

## 2021-10-29 LAB — GLUCOSE, CAPILLARY
Glucose-Capillary: 177 mg/dL — ABNORMAL HIGH (ref 70–99)
Glucose-Capillary: 176 mg/dL — ABNORMAL HIGH (ref 70–99)
Glucose-Capillary: 70 mg/dL (ref 70–99)
Glucose-Capillary: 181 mg/dL — ABNORMAL HIGH (ref 70–99)

## 2021-10-29 SURGERY — RIGHT/LEFT HEART CATH AND CORONARY ANGIOGRAPHY
Anesthesia: LOCAL

## 2021-10-29 MED ORDER — SODIUM CHLORIDE 0.9 % IV SOLN: 250.0000 mL | INTRAVENOUS | Status: DC | PRN

## 2021-10-29 MED ORDER — VERAPAMIL HCL 2.5 MG/ML IV SOLN
INTRAVENOUS | Status: DC | PRN
  Administered 2021-10-29: 10 mL via INTRA_ARTERIAL

## 2021-10-29 MED ORDER — CARVEDILOL 12.5 MG PO TABS
12.5000 mg | ORAL_TABLET | Freq: Two times a day (BID) | ORAL | Status: DC
  Administered 2021-10-29 – 2021-10-30 (×2): 12.5 mg via ORAL
  Filled 2021-10-29 (×2): qty 1

## 2021-10-29 MED ORDER — HYDRALAZINE HCL 20 MG/ML IJ SOLN: 10.0000 mg | INTRAMUSCULAR | Status: AC | PRN

## 2021-10-29 MED ORDER — IOHEXOL 350 MG/ML SOLN
INTRAVENOUS | Status: DC | PRN
  Administered 2021-10-29: 55 mL

## 2021-10-29 MED ORDER — FUROSEMIDE 40 MG PO TABS
40.0000 mg | ORAL_TABLET | Freq: Every day | ORAL | Status: DC
  Administered 2021-10-29 – 2021-10-30 (×2): 40 mg via ORAL
  Filled 2021-10-29 (×2): qty 1

## 2021-10-29 MED ORDER — LIDOCAINE HCL (PF) 1 % IJ SOLN
INTRAMUSCULAR | Status: AC
  Filled 2021-10-29: qty 30

## 2021-10-29 MED ORDER — SODIUM CHLORIDE 0.9 % IV SOLN
250.0000 mL | INTRAVENOUS | Status: DC | PRN
Start: 2021-10-29 — End: 2021-10-30

## 2021-10-29 MED ORDER — SODIUM CHLORIDE 0.9% FLUSH: 3.0000 mL | INTRAVENOUS | Status: DC | PRN

## 2021-10-29 MED ORDER — ASPIRIN 81 MG PO CHEW: 81.0000 mg | CHEWABLE_TABLET | ORAL | Status: DC

## 2021-10-29 MED ORDER — VERAPAMIL HCL 2.5 MG/ML IV SOLN
INTRAVENOUS | Status: AC
  Filled 2021-10-29: qty 2

## 2021-10-29 MED ORDER — FENTANYL CITRATE (PF) 100 MCG/2ML IJ SOLN
INTRAMUSCULAR | Status: DC | PRN
Start: 2021-10-29 — End: 2021-10-29
  Administered 2021-10-29: 50 ug via INTRAVENOUS

## 2021-10-29 MED ORDER — HEPARIN SODIUM (PORCINE) 1000 UNIT/ML IJ SOLN
INTRAMUSCULAR | Status: AC
  Filled 2021-10-29: qty 10

## 2021-10-29 MED ORDER — LIDOCAINE HCL (PF) 1 % IJ SOLN
INTRAMUSCULAR | Status: DC | PRN
  Administered 2021-10-29 (×2): 2 mL

## 2021-10-29 MED ORDER — HEPARIN (PORCINE) IN NACL 1000-0.9 UT/500ML-% IV SOLN
INTRAVENOUS | Status: AC
  Filled 2021-10-29: qty 1000

## 2021-10-29 MED ORDER — HEPARIN SODIUM (PORCINE) 1000 UNIT/ML IJ SOLN
INTRAMUSCULAR | Status: DC | PRN
  Administered 2021-10-29: 4000 [IU] via INTRAVENOUS

## 2021-10-29 MED ORDER — FENTANYL CITRATE (PF) 100 MCG/2ML IJ SOLN
INTRAMUSCULAR | Status: AC
  Filled 2021-10-29: qty 2

## 2021-10-29 MED ORDER — SODIUM CHLORIDE 0.9% FLUSH
3.0000 mL | Freq: Two times a day (BID) | INTRAVENOUS | Status: DC
  Administered 2021-10-30: 3 mL via INTRAVENOUS

## 2021-10-29 MED ORDER — HEPARIN (PORCINE) IN NACL 1000-0.9 UT/500ML-% IV SOLN
INTRAVENOUS | Status: DC | PRN
  Administered 2021-10-29 (×2): 500 mL

## 2021-10-29 MED ORDER — SPIRONOLACTONE 12.5 MG HALF TABLET
12.5000 mg | ORAL_TABLET | Freq: Every day | ORAL | Status: DC
  Administered 2021-10-29 – 2021-10-30 (×2): 12.5 mg via ORAL
  Filled 2021-10-29 (×2): qty 1

## 2021-10-29 MED ORDER — MIDAZOLAM HCL 2 MG/2ML IJ SOLN
INTRAMUSCULAR | Status: AC
  Filled 2021-10-29: qty 2

## 2021-10-29 MED ORDER — ENOXAPARIN SODIUM 40 MG/0.4ML IJ SOSY
40.0000 mg | PREFILLED_SYRINGE | Freq: Every day | INTRAMUSCULAR | Status: DC
  Administered 2021-10-30: 40 mg via SUBCUTANEOUS
  Filled 2021-10-29: qty 0.4

## 2021-10-29 MED ORDER — SODIUM CHLORIDE 0.9% FLUSH
3.0000 mL | Freq: Two times a day (BID) | INTRAVENOUS | Status: DC
  Administered 2021-10-29 – 2021-10-30 (×2): 3 mL via INTRAVENOUS

## 2021-10-29 MED ORDER — SACUBITRIL-VALSARTAN 24-26 MG PO TABS
1.0000 | ORAL_TABLET | Freq: Two times a day (BID) | ORAL | Status: DC
Start: 2021-10-30 — End: 2021-10-30
  Filled 2021-10-29: qty 1

## 2021-10-29 MED ORDER — MIDAZOLAM HCL 2 MG/2ML IJ SOLN
INTRAMUSCULAR | Status: DC | PRN
  Administered 2021-10-29: 2 mg via INTRAVENOUS

## 2021-10-29 MED ORDER — SODIUM CHLORIDE 0.9 % IV SOLN: INTRAVENOUS | Status: DC

## 2021-10-29 MED ORDER — LABETALOL HCL 5 MG/ML IV SOLN: 10.0000 mg | INTRAVENOUS | Status: AC | PRN

## 2021-10-29 SURGICAL SUPPLY — 16 items
BAND CMPR LRG ZPHR (HEMOSTASIS) ×1
BAND ZEPHYR COMPRESS 30 LONG (HEMOSTASIS) ×1 IMPLANT
CATH 5FR JL3.5 JR4 ANG PIG MP (CATHETERS) ×1 IMPLANT
CATH BALLN WEDGE 5F 110CM (CATHETERS) ×1 IMPLANT
CATH INFINITI 5FR AL1 (CATHETERS) ×1 IMPLANT
GLIDESHEATH SLEND SS 6F .021 (SHEATH) ×1 IMPLANT
GUIDEWIRE .025 260CM (WIRE) ×1 IMPLANT
GUIDEWIRE INQWIRE 1.5J.035X260 (WIRE) IMPLANT
INQWIRE 1.5J .035X260CM (WIRE) ×2
KIT HEART LEFT (KITS) ×3 IMPLANT
PACK CARDIAC CATHETERIZATION (CUSTOM PROCEDURE TRAY) ×3 IMPLANT
SHEATH GLIDE SLENDER 4/5FR (SHEATH) ×1 IMPLANT
TRANSDUCER W/STOPCOCK (MISCELLANEOUS) ×3 IMPLANT
TUBING CIL FLEX 10 FLL-RA (TUBING) ×3 IMPLANT
WIRE EMERALD ST .035X150CM (WIRE) ×1 IMPLANT
WIRE HI TORQ VERSACORE-J 145CM (WIRE) ×1 IMPLANT

## 2021-10-29 NOTE — Progress Notes (Signed)
   10/28/21 2314  Vitals  Temp 98.3 F (36.8 C)  Temp Source Oral  BP (!) 136/91  MAP (mmHg) 106  BP Location Left Arm  BP Method Automatic  Patient Position (if appropriate) Lying  Pulse Rate Source Monitor  ECG Heart Rate 87  Resp 18  Level of Consciousness  Level of Consciousness Alert  MEWS COLOR  MEWS Score Color Green  Oxygen Therapy  SpO2 100 %  O2 Device Room Air

## 2021-10-29 NOTE — Progress Notes (Addendum)
[facility] PROGRESS NOTE  Caleb [name] NID:[id number] DOB: 08-25-1950 DOA: 10/27/2021 PCP: [name], MD  Status: Remains inpatient appropriate because: Symptomatic new onset HFrEF with associated moderate to severe aortic stenosis requiring multiple medication adjustments and cardiology consultation.  Anticipate will require cardiac catheterization this procedure and possible additional procedures such as TAVR or based on results of cardiac cath  Barriers to discharge: Social: None Clinical: As stated above and rationale for remaining inpatient  Level of care:  Telemetry Cardiac   Code Status: Full Family Communication: Patient only who states he will communicate data provided to him about changes in status to his daughters DVT prophylaxis: Lovenox   HPI: 71 year old M with HTN, aortic stenosis, peripheral vascular disease status popliteal and carotid stents who presented with 3 days shortness of breath, chest tightness after spending 48 hours driving to [place] and then [place] and then home.  In the ER, chest angiogram showed pulmonary edema, no pulmonary embolism.  Blood pressure was markedly elevated, BNP was >1000.  He was started on Lasix and admitted.    Since admission a follow-up echocardiogram has been completed that demonstrates significant decrease in ejection fraction to 20% in setting of known aortic stenosis.  Patient continues to have dyspnea on exertion and ischemic etiology needs to be ruled out but anticipate is related to underlying aortic stenosis.  Cardiology has been consulted  Subjective: States feels much better but continues to have dyspnea on exertion.  No peripheral edema.  Objective: Vitals:   10/29/21 0442 10/29/21 0700  BP: (S) (!) 158/104 (S) (!) 140/94  Pulse:    Resp:    Temp: (!) 97.4 F (36.3 C)   SpO2: 100%     Intake/Output Summary (Last 24 hours) at 10/29/2021 0742 Last data filed at 10/29/2021 0700 Gross per 24 hour   Intake 720 ml  Output 2550 ml  Net -1830 ml   Filed Weights   10/27/21 1717 10/28/21 1827 10/29/21 0442  Weight: 83.5 kg 83.2 kg 81.7 kg    Exam:  Constitutional: NAD, calm, comfortable Respiratory: Anterior lung sounds are clear to auscultation although from mid fields down especially in the bases there are notable inspiratory and expiratory crackles worse on the right side.  No increased work of breathing.  Stable on room air.  Reported to have O2 desaturations with ambulation.  No accessory muscle use.  Cardiovascular: Regular rate and attaining sinus rhythm, no murmurs / rubs / gallops. No extremity edema. 2+ pedal pulses.  Abdomen: no tenderness, no masses palpated. No hepatosplenomegaly. Bowel sounds positive.  Musculoskeletal: no clubbing / cyanosis. No joint deformity upper and lower extremities. Good ROM, no contractures. Normal muscle tone.  Skin: no rashes, lesions, ulcers. No induration Neurologic: CN 2-12 grossly intact. Sensation intact, DTR normal. Strength 5/5 x all 4 extremities.  Psychiatric: Normal judgment and insight. Alert and oriented x 3. Normal mood.    Assessment/Plan: * Chronic diastolic CHF (congestive heart failure) now converted to acute systolic heart failure (HFrEF) context of moderate to severe aortic stenosis -Negative 4 L no peripheral edema - Creatinine has bumped to 1.47 with a GFR of 51 so for now we will have DC'd IV Lasix in favor of Lasix 40 mg daily and have added Aldactone 12.5 mg daily -Change preadmission Lopressor to carvedilol 12.5 mg twice daily -GDMT: Continue Zestril 10 mg daily.  Will defer to cardiology as to whether can appropriately increase ACE inhibitor in context of decreasing GFR in association with significant reduction of  EF - Continue strict ins and outs, daily weights, daily BMP - Echocardiogram this admission demonstrates significant decrease in EF to 20% therefore cardiology consulted for possible cardiac catheterization  and consideration for TAVR procedure  Acute kidney injury --Current creatinine 1.47 with a BUN of 25 and a GFR of 51 --Baseline creatinine between 1.39 and 1.19 --Unclear if abnormal renal function secondary to low perfusion from decreased EF or from diuresis or both --Continue to follow labs   Hypertensive urgency Blood pressure 198 on admission, apparently at home his blood pressure systolic has been running in the 160s - BP better but still not well-controlled -See above regarding cardiac medication adjustments noting continuing Zestril, changing Lopressor to carvedilol, adding daily Lasix and Aldactone --Given significant AAS would try to avoid nitrates and afterload reducing agent such as hydralazine and less cardiology recommends   Type 2 diabetes mellitus with hyperglycemia, with long-term current use of insulin (HCC) --Glucose controlled.   --Hemoglobin A1c 8% July 2023 - Continue 70/30 - Sliding scale corrections   Peripheral vascular disease (HCC) - Continue aspirin and atorvastatin and Plavix    Data Reviewed: Basic Metabolic Panel: Recent Labs  Lab 10/27/21 1707 10/28/21 0416 10/29/21 0412  NA 139  --  138  K 4.0  --  3.7  CL 108  --  104  CO2 23  --  24  GLUCOSE 130*  --  149*  BUN 16  --  25*  CREATININE 1.38* 1.35* 1.47*  CALCIUM 8.7*  --  8.7*  MG  --  1.9  --    Liver Function Tests: No results for input(s): "AST", "ALT", "ALKPHOS", "BILITOT", "PROT", "ALBUMIN" in the last 168 hours. No results for input(s): "LIPASE", "AMYLASE" in the last 168 hours. No results for input(s): "AMMONIA" in the last 168 hours. CBC: Recent Labs  Lab 10/27/21 1707 10/28/21 0416  WBC 5.6 6.0  NEUTROABS 2.8  --   HGB 13.3 12.9*  HCT 41.4 40.1  MCV 85.0 85.0  PLT 325 324   Cardiac Enzymes: No results for input(s): "CKTOTAL", "CKMB", "CKMBINDEX", "TROPONINI" in the last 168 hours. BNP (last 3 results) Recent Labs    10/27/21 1707  BNP 1,165.9*    ProBNP (last  3 results) No results for input(s): "PROBNP" in the last 8760 hours.  CBG: Recent Labs  Lab 10/28/21 0746 10/28/21 0747 10/28/21 1201 10/28/21 1830 10/28/21 2308  GLUCAP 133* 166* 129* 186* 172*    No results found for this or any previous visit (from the past 240 hour(s)).   Studies: CT Angio Chest PE W and/or Wo Contrast  Result Date: 10/28/2021 CLINICAL DATA:  Pulmonary embolism (PE) suspected, unknown D-dimer SOB, CHF, recent prolonged travel. EXAM: CT ANGIOGRAPHY CHEST WITH CONTRAST TECHNIQUE: Multidetector CT imaging of the chest was performed using the standard protocol during bolus administration of intravenous contrast. Multiplanar CT image reconstructions and MIPs were obtained to evaluate the vascular anatomy. RADIATION DOSE REDUCTION: This exam was performed according to the departmental dose-optimization program which includes automated exposure control, adjustment of the mA and/or kV according to patient size and/or use of iterative reconstruction technique. CONTRAST:  29m OMNIPAQUE IOHEXOL 350 MG/ML SOLN COMPARISON:  None Available. FINDINGS: Cardiovascular: There is adequate opacification of the pulmonary arterial tree. No intraluminal filling defect identified to suggest acute pulmonary embolism. Central pulmonary arteries are of normal caliber. Extensive multi-vessel coronary artery calcification. Mild global cardiomegaly. Calcification of the aortic valve leaflets noted. No pericardial effusion. The thoracic aorta is not opacified  but is of normal caliber. Mild atherosclerotic calcification. Mediastinum/Nodes: No enlarged mediastinal, hilar, or axillary lymph nodes. Thyroid gland, trachea, and esophagus demonstrate no significant findings. Lungs/Pleura: Moderate right and small left pleural effusions are present. There is thickening of the peribronchovascular interstitium, smooth interlobular septal thickening, and scattered peripheral nodular ground-glass opacities all  suggesting mild-to-moderate interstitial pulmonary edema. Altogether, these findings suggest changes of mild cardiogenic failure. No pneumothorax. No central obstructing lesion. Upper Abdomen: No acute abnormality. Musculoskeletal: No acute abnormality. Review of the MIP images confirms the above findings. IMPRESSION: 1. No pulmonary embolism. 2. Mild global cardiomegaly. Extensive multi-vessel coronary artery calcification. 3. Calcification of the aortic valve leaflets. Echocardiography may be helpful for further evaluation, if indicated. 4. Moderate right and small left pleural effusions with associated pulmonary interstitial edema. Altogether, these findings suggest changes of mild cardiogenic failure. Aortic Atherosclerosis (ICD10-I70.0). Electronically Signed   By: Fidela Salisbury M.D.   On: 10/28/2021 01:22   DG Chest 2 View  Result Date: 10/27/2021 CLINICAL DATA:  sob EXAM: CHEST - 2 VIEW COMPARISON:  Aug 12, 2012 FINDINGS: Mild cardiomegaly. Thoracic aorta is ectatic. There is bilateral perihilar stranding and peribronchial cuffing seen. No consolidation or pleural effusion. Moderate thoracic spondylosis. IMPRESSION: Mild cardiomegaly. There is perihilar stranding and perihilar cuffing seen of likely bronchitis. Superimposed infiltrations cannot be excluded. Electronically Signed   By: Frazier Richards M.D.   On: 10/27/2021 17:49    Scheduled Meds:  aspirin EC  81 mg Oral Daily   atorvastatin  40 mg Oral Daily   clopidogrel  75 mg Oral Daily   enoxaparin (LOVENOX) injection  40 mg Subcutaneous Daily   furosemide  40 mg Oral Daily   gabapentin  800 mg Oral QHS   insulin aspart  0-9 Units Subcutaneous TID WC   insulin aspart protamine- aspart  7 Units Subcutaneous Q breakfast   insulin aspart protamine- aspart  8 Units Subcutaneous Q supper   lisinopril  10 mg Oral Daily   metoprolol tartrate  50 mg Oral BID   Continuous Infusions:  Principal Problem:   Acute on chronic diastolic CHF  (congestive heart failure) ([place]) Active Problems:   Peripheral vascular disease ([place])   Type 2 diabetes mellitus with hyperglycemia, with long-term current use of insulin ([place])   Hypertensive urgency   Consultants: Cardiology  Procedures: Echocardiogram  Antibiotics: None    Time spent: 50 minutes    Erin Hearing ANP  [facility] 7 am - 330 pm/M-F for direct patient care and secure chat Please refer to Amion for contact info 1  days  Attending MD note  Patient was seen, examined,treatment plan was discussed with the  Ms. Venita Lick, APP.  I have personally reviewed the clinical findings, lab,EKG, imaging studies and management of this patient in detail.I have also reviewed the orders written for this patient which were under my direction. I agree with the documentation, as recorded by the Advance Practice Provider.   Briefly 71 year old male with HTN, moderate aortic stenosis, PVD status post popliteal and carotid stents presented with shortness of breath, chest tightness after spending 48 hours driving to [place], then [place] and to [employer]. BNP> 1000, patient was placed on Lasix and admitted. CTA showed no PE.  BP was markedly elevated.  BP (!) 147/88 (BP Location: Left Arm)   Pulse 88   Temp 98 F (36.7 C) (Oral)   Resp 19   Ht 5' 7.01" (1.702 m)   Wt 81.7 kg   SpO2 100%  BMI 28.22 kg/m  Labs and imagings reviewed by myself. Physical Exam General: Alert and oriented x 3, NAD Cardiovascular: S1 S2 clear, RRR.  Systolic murmur, JVD + Respiratory: CTAB, no wheezing, rales or rhonchi Gastrointestinal: Soft, nontender, nondistended, NBS Ext: no pedal edema bilaterally Neuro: no new deficits  Agree with assessment and plan, discussed in detail with Ms. Lissa Merlin.  Acute systolic CHF, new diagnosis with underlying aortic stenosis, hypertensive urgency -2D echo in 10/22 had shown normal EF 50 to 55% with G1 DD, moderate aortic stenosis -Patient was placed on IV Lasix,  continue strict I's and O's and daily weights, symptoms improving -2D echo repeated 8/10 showed significantly dropped EF to 20%, moderate AAS.  Cardiology consulted, will await recommendations, likely will need cardiac cath.  Follows Dr. Gwenlyn Found -Needs strict BP control   Dhriti Fales M.D. [facility] 10/29/2021, 11:11 AM

## 2021-10-29 NOTE — Plan of Care (Signed)

## 2021-10-29 NOTE — Plan of Care (Signed)
  Problem: Education: Goal: Knowledge of General Education information will improve Description: Including pain rating scale, medication(s)/side effects and non-pharmacologic comfort measures Outcome: Progressing   Problem: Health Behavior/Discharge Planning: Goal: Ability to manage health-related needs will improve Outcome: Progressing   Problem: Clinical Measurements: Goal: Ability to maintain clinical measurements within normal limits will improve Outcome: Progressing   Problem: Education: Goal: Ability to demonstrate management of disease process will improve 10/29/2021 0346 by Zadie Rhine, RN Outcome: Progressing 10/29/2021 0243 by Zadie Rhine, RN Outcome: Progressing Goal: Ability to verbalize understanding of medication therapies will improve 10/29/2021 0346 by Zadie Rhine, RN Outcome: Progressing 10/29/2021 0243 by Zadie Rhine, RN Outcome: Progressing Goal: Individualized Educational Video(s) 10/29/2021 0346 by Zadie Rhine, RN Outcome: Progressing 10/29/2021 0243 by Zadie Rhine, RN Outcome: Progressing   Problem: Activity: Goal: Capacity to carry out activities will improve 10/29/2021 0346 by Zadie Rhine, RN Outcome: Progressing 10/29/2021 0243 by Zadie Rhine, RN Outcome: Progressing   Problem: Cardiac: Goal: Ability to achieve and maintain adequate cardiopulmonary perfusion will improve Outcome: Progressing

## 2021-10-29 NOTE — Consult Note (Signed)
   [facility] Inpatient Consult   10/29/2021  DANTRE YEARWOOD December 14, 1950 [id number]  [place] Organization [ACO] Patient:  [employer]  Primary Care Provider:  [name], MD, [facility]  Patient is currently active with [facility] for chronic disease management services.  Patient has been engaged by a [place].    11:35 am Met with the patient at the bedside to assess for any SDOH or post hospital needs for ongoing care coordination.  Patient is jovial and states, "I know I am in good hands here but we need to find out what is going on with my heart because I have been very active." SDOH reviewed and is currently up-to-date.  Our community based plan of care has focused on disease management and community resource support.    Plan: Patient will receive a post hospital call and will be evaluated for assessments and disease process education, if disposition is to return home, which is anticipated.    Notified Inpatient Transition Of Care [TOC] team member to make aware that [facility] following.   Of note, [facility] Care Management services does not replace or interfere with any services that are needed or arranged by inpatient [facility] care management team.  For additional questions or referrals please contact:  Natividad Brood, RN BSN [facility] Liaison  [phone] business mobile phone Toll free office [phone]  Fax number: [phone] [email or web address]

## 2021-10-29 NOTE — TOC Progression Note (Signed)
Transition of Care [facility]) - Progression Note    Patient Details  Name: Caleb [name] MRN: [id number] Date of Birth: Jun 23, 1950  Transition of Care [facility]) CM/SW Contact  Zenon Mayo, RN Phone Number: 10/29/2021, 4:37 PM  Clinical Narrative:    from home, indep, CHF, conts on po diuretics, monitor kidney fxn, hx AS. , he states he will need to buy a scale and a bp cuff, he eats a low sodium diet.  He will drive himself home at dc. He goes to [place] [facility].  He states he will be here til Monday, TOC following.         Expected Discharge Plan and Services                                                 Social Determinants of Health (SDOH) Interventions Food Insecurity Interventions: Intervention Not Indicated Financial Strain Interventions: Intervention Not Indicated Housing Interventions: Intervention Not Indicated Transportation Interventions: Intervention Not Indicated  Readmission Risk Interventions     No data to display

## 2021-10-29 NOTE — Progress Notes (Addendum)
Heart Failure Stewardship Pharmacist Progress Note   PCP: [name], MD PCP-Cardiologist: Quay Burow, MD    HPI:  71 yo M with PMH of PAD, CAD, HTN, HLD, and T2DM.   Referred to vascular for PAD in May 2021. ECHO in June 2021 showed LVEF drop to 40-45%. Stress test without ischemia. Repeat ECHO in Oct 2022 with improvement in LVEF 50-55%.  He presented to the ED on 8/9 with shortness of breath and orthopnea. CXR with mild cardiomegaly. CTA was negative for PE with moderate moderate pleural effusions and associated pulmonary edema. ECHO on 8/10 with LVEF 20%, G2DD, moderately reduced RV. R/LHC scheduled for today.  Current HF Medications: Diuretic: furosemide 40 mg daily Beta Blocker: carvedilol 12.5 mg BID ACE/ARB/ARNI: lisinopril 10 mg daily MRA: spironolactone 12.5 mg daily  Prior to admission HF Medications: Beta blocker: metoprolol tartrate 50 mg BID ACE/ARB/ARNI: lisinopril 10 mg daily SGLT2i: Jardiance 25 mg daily  Pertinent Lab Values: Serum creatinine 1.47, BUN 25, Potassium 3.7, Sodium 138, BNP 1165.9, Magnesium 1.9, A1c 8.0  Vital Signs: Weight: 180 lbs (admission weight: 183 lbs) Blood pressure: 140-150/90s  Heart rate: 70-80s  I/O: -5.2L yesterday; net -4.7L  Medication Assistance / Insurance Benefits Check: Does the patient have prescription insurance?  Yes Type of insurance plan: Early  Outpatient Pharmacy:  Prior to admission outpatient pharmacy: [facility] Is the patient willing to use [facility] at discharge? Yes Is the patient willing to transition their outpatient pharmacy to utilize a [facility] outpatient pharmacy?   No    Assessment: 1. Acute on chronic systolic CHF (LVEF 17%). Ischemia evaluation today. NYHA class II symptoms. - Continue furosemide 40 mg daily pending results of RHC today - Agree with adding carvedilol 12.5 mg BID - On lisinopril 10 mg daily. Consider changing to losartan x 1 day, then starting Entresto  24/26 mg BID for GDMT optimization. Alternatively, could stop lisinopril and then start [facility] tomorrow evening, allowing for 36h washout without losartan bridge.  - Agree with starting spironolactone 12.5 mg daily - Consider adding SGLT2i after cath. Vania Rea is [facility] with [facility] and he was on a combination product with metformin PTA.    Plan: 1) Medication changes recommended at this time: - Stop lisinopril - Start losartan 25 mg tomorrow. Plan to start Entresto 24/26 mg BID on 8/13.  2) Patient assistance: - None needed, has [facility] benefits  3)  Education  - To be completed prior to discharge  Kerby Nora, PharmD, BCPS Heart Failure Stewardship Pharmacist Phone [phone]

## 2021-10-29 NOTE — Progress Notes (Signed)
PIV consult: Pt already taken to cath lab. Cancel consult, per RN.

## 2021-10-29 NOTE — Consult Note (Addendum)
Cardiology Consultation:   Patient ID: Caleb [name] MRN: [id number]; DOB: [birth year]  Admit date: 10/27/2021 Date of Consult: 10/29/2021  PCP:  [name], MD   [facility] Providers Cardiologist:  Quay Burow, MD        Patient Profile:   Caleb [name] is a 71 y.o. male with a hx of PAD, carotid artery disease, hypertension, hyperlipidemia, and DM2 who is being seen 10/29/2021 for the evaluation of LV dysfunction at the request of Dr. Tana Coast.  History of Present Illness:   Caleb [name] is a 71 year old male with past medical history of PAD, carotid artery disease, hypertension, hyperlipidemia, and DM2.  Patient was initially referred to cardiology service for evaluation of critical limb ischemia.  He underwent peripheral angiography in May 2021 and underwent drug-coated balloon angioplasty for high-grade right SFA and the popliteal stenosis with two-vessel runoff.  Carotid Doppler showed high-grade right greater than left ICA stenosis, he was referred to vascular surgery.  Echocardiogram in June 2021 showed EF dropped down to 40 to 45% with moderate to severe aortic stenosis.  Myoview obtained on 08/30/2019 showed inferior scar without ischemia.  Due to tingling in the left fourth toe, he underwent repeat lower extremity angiography that revealed occluded left popliteal artery with occluded posterior tibial and peroneal with high-grade AT disease, he underwent left popliteal CTO intervention on 11/18/2019 with self-expanding stent.  He did have left fourth and fifth toe amputation by Dr. Posey Pronto on 11/22/2019.  Since then, he continues to be very active.  Repeat echocardiogram obtained on 12/21/2020 showed EF improved to 50-55%, grade 1 DD, moderate aortic stenosis.  More recently, since about 2 weeks ago, he noticed some orthopnea symptoms.  He subsequently drove his grandson for 16 hours down to [facility] before coming back.  After he came back to [place], he started noticing  significant shortness of breath with minimal exertion.  He eventually sought medical attention at [facility], ED on 10/27/2021.  On arrival, he was hypertensive with systolic blood pressure greater than 190s.  BNP was 1165.9.  Serial troponin was 84--> 79.  Patient denied any chest pain.  Chest x-ray showed mild cardiomegaly with perihilar stranding.  CTA of the chest obtained on 10/28/2021 showed no PE, mild global cardiomegaly, extensive multivessel CAD, moderate right, small left pleural effusion with associated pulmonary interstitial edema.  TSH was normal.  Subsequent echocardiogram obtained on same day showed EF down to 20%, global hypokinesis, grade 2 DD, moderately reduced RV systolic function, trivial MR, mild to moderate AI, low-flow low gradient aortic valve stenosis which was at least moderate.  Cardiology service consulted for LV dysfunction and heart failure.   Past Medical History:  Diagnosis Date   Aortic stenosis    Carotid artery occlusion    Diabetes mellitus without complication ([place])    Dyslipidemia 09/27/2012   Erectile dysfunction 09/27/2012   Heart murmur    Hypercholesteremia    Hyperlipidemia 08/12/2012   Hypertension    Hyponatremia 08/14/2012   Neuropathy    Pancreatitis    Pancreatitis, acute 09/27/2012   Peripheral vascular disease ([place])    Vitamin D deficiency 06/23/2015    Past Surgical History:  Procedure Laterality Date   ABDOMINAL AORTOGRAM W/LOWER EXTREMITY N/A 08/08/2019   Procedure: ABDOMINAL AORTOGRAM W/LOWER EXTREMITY;  Surgeon: Lorretta Harp, MD;  Location: [place] [facility];  Service: Cardiovascular;  Laterality: N/A;   ABDOMINAL AORTOGRAM W/LOWER EXTREMITY N/A 11/18/2019   Procedure: ABDOMINAL AORTOGRAM W/LOWER EXTREMITY;  Surgeon:  Lorretta Harp, MD;  Location: [place] [facility];  Service: Cardiovascular;  Laterality: N/A;   COLONOSCOPY  12 years ago   in [place] clinic= normal exam per pt   KNEE SURGERY     PERIPHERAL VASCULAR INTERVENTION Right  08/08/2019   Procedure: PERIPHERAL VASCULAR INTERVENTION;  Surgeon: Lorretta Harp, MD;  Location: [place] [facility];  Service: Cardiovascular;  Laterality: Right;   PERIPHERAL VASCULAR INTERVENTION Left 11/18/2019   Procedure: PERIPHERAL VASCULAR INTERVENTION;  Surgeon: Lorretta Harp, MD;  Location: [place] [facility];  Service: Cardiovascular;  Laterality: Left;  left popliteal artery   TRANSCAROTID ARTERY REVASCULARIZATION  Left 12/16/2019   Procedure: TRANSCAROTID ARTERY REVASCULARIZATION;  Surgeon: Elam Dutch, MD;  Location: [place];  Service: Vascular;  Laterality: Left;   TRANSCAROTID ARTERY REVASCULARIZATION  Right 02/03/2020   Procedure: RIGHT TRANSCAROTID ARTERY REVASCULARIZATION;  Surgeon: Elam Dutch, MD;  Location: [place];  Service: Vascular;  Laterality: Right;   ULTRASOUND GUIDANCE FOR VASCULAR ACCESS Right 12/16/2019   Procedure: ULTRASOUND GUIDANCE FOR VASCULAR ACCESS;  Surgeon: Elam Dutch, MD;  Location: [place];  Service: Vascular;  Laterality: Right;   ULTRASOUND GUIDANCE FOR VASCULAR ACCESS Right 02/03/2020   Procedure: ULTRASOUND GUIDANCE FOR VASCULAR ACCESS;  Surgeon: Elam Dutch, MD;  Location: [facility] OR;  Service: Vascular;  Laterality: Right;   VASECTOMY       Home Medications:  Prior to Admission medications   Medication Sig Start Date End Date Taking? Authorizing Provider  amLODipine (NORVASC) 5 MG tablet Take 1 tablet (5 mg total) by mouth daily. 07/22/21   [name], MD  aspirin EC 81 MG tablet Take 81 mg by mouth daily.    [provider]  atorvastatin (LIPITOR) 40 MG tablet Take by mouth. 07/03/20   [provider]  clopidogrel (PLAVIX) 75 MG tablet Take 1 tablet (75 mg total) by mouth daily. 01/19/21 01/19/22  [name], MD  Empagliflozin-metFORMIN HCl 12.07-998 MG TABS Take 2 tablets by mouth daily in the afternoon. 09/29/21   Shamleffer, Melanie Crazier, MD  gabapentin (NEURONTIN) 300 MG capsule TAKE 1  CAPSULE TWICE DAILY 09/29/20   [name], MD  gabapentin (NEURONTIN) 800 MG tablet Take 1 tablet by mouth 2 (two) times daily. 07/03/20   [provider]  insulin NPH-regular Human (NOVOLIN 70/30) (70-30) 100 UNIT/ML injection Inject 12 Units into the skin 2 (two) times daily with a meal. Patient taking differently: Inject 6 Units into the skin 2 (two) times daily with a meal. 07/22/20   [name], MD  lisinopril (ZESTRIL) 10 MG tablet Take 1 tablet by mouth daily at 6 (six) AM. 08/24/21   [provider]  metoprolol tartrate (LOPRESSOR) 50 MG tablet Take 1 tablet (50 mg total) by mouth 2 (two) times daily. 07/22/21   [name], MD  rosuvastatin (CRESTOR) 40 MG tablet Take 1 tablet (40 mg total) by mouth daily. To lower cholesterol 07/01/20   [name], MD  vitamin B-12 (CYANOCOBALAMIN) 500 MCG tablet Take 500 mcg by mouth daily. 08/24/21   [provider]    Inpatient Medications: Scheduled Meds:  aspirin EC  81 mg Oral Daily   atorvastatin  40 mg Oral Daily   carvedilol  12.5 mg Oral BID WC   clopidogrel  75 mg Oral Daily   enoxaparin (LOVENOX) injection  40 mg Subcutaneous Daily   furosemide  40 mg Oral Daily   gabapentin  800 mg Oral QHS   insulin aspart  0-9 Units Subcutaneous TID WC   insulin aspart protamine- aspart  7 Units Subcutaneous Q breakfast   insulin aspart protamine- aspart  8 Units Subcutaneous Q supper   lisinopril  10 mg Oral Daily   spironolactone  12.5 mg Oral Daily   Continuous Infusions:  PRN Meds: acetaminophen **OR** acetaminophen  Allergies:    Allergies  Allergen Reactions   Percocet [Oxycodone-Acetaminophen] Nausea And Vomiting    10/325. Pt has 3 doses of this medication. N/V, temperature 97, no chills and slight sweating. Pt dressing was C/D/I no drainage noted.     Social History:   Social History   Socioeconomic History   Marital status: Widowed    Spouse name: Not on file   Number of children: Not on  file   Years of education: Not on file   Highest education level: Not on file  Occupational History   Occupation: Caregiver    Comment: Special needs  Tobacco Use   Smoking status: Never   Smokeless tobacco: Never  Vaping Use   Vaping Use: Never used  Substance and Sexual Activity   Alcohol use: Not Currently    Comment: rare   Drug use: No   Sexual activity: Not Currently  Other Topics Concern   Not on file  Social History Narrative   Widower.  3 daughters and 3 grandchildren.     Social Determinants of Health   Financial Resource Strain: Low Risk  (12/06/2020)   Overall Financial Resource Strain (CARDIA)    Difficulty of Paying Living Expenses: Not hard at all  Food Insecurity: No Food Insecurity (06/09/2021)   Hunger Vital Sign    Worried About Running Out of Food in the Last Year: Never true    Ran Out of Food in the Last Year: Never true  Transportation Needs: No Transportation Needs (06/09/2021)   PRAPARE - Hydrologist (Medical): No    Lack of Transportation (Non-Medical): No  Physical Activity: Sufficiently Active (12/06/2020)   Exercise Vital Sign    Days of Exercise per Week: 4 days    Minutes of Exercise per Session: 60 min  Stress: No Stress Concern Present (12/06/2020)   [place]    Feeling of Stress : Not at all  Social Connections: Moderately Isolated (12/06/2020)   Social Connection and Isolation Panel [NHANES]    Frequency of Communication with Friends and Family: More than three times a week    Frequency of Social Gatherings with Friends and Family: More than three times a week    Attends Religious Services: More than 4 times per year    Active Member of [employer] or Organizations: No    Attends Archivist Meetings: Never    Marital Status: Widowed  Intimate Partner Violence: Not At Risk (12/06/2020)   Humiliation, Afraid, Rape, and Kick questionnaire     Fear of Current or Ex-Partner: No    Emotionally Abused: No    Physically Abused: No    Sexually Abused: No    Family History:    Family History  Problem Relation Age of Onset   CAD Father    Hypertension Father    Alcohol abuse Father        Cause of death   Diabetes Mother    Colon polyps Mother    CAD Brother 55       CABG   Colon cancer Neg Hx    Esophageal cancer Neg  Hx    Rectal cancer Neg Hx    Stomach cancer Neg Hx      ROS:  Please see the history of present illness.   All other ROS reviewed and negative.     Physical Exam/Data:   Vitals:   10/28/21 2314 10/29/21 0442 10/29/21 0700 10/29/21 0815  BP: (S) (!) 136/91 (S) (!) 158/104 (S) (!) 140/94 (!) 147/88  Pulse:    88  Resp: 18   19  Temp: 98.3 F (36.8 C) (!) 97.4 F (36.3 C)  98 F (36.7 C)  TempSrc: Oral Oral  Oral  SpO2: 100% 100%  100%  Weight:  81.7 kg    Height:        Intake/Output Summary (Last 24 hours) at 10/29/2021 1136 Last data filed at 10/29/2021 0700 Gross per 24 hour  Intake 720 ml  Output 2550 ml  Net -1830 ml      10/29/2021    4:42 AM 10/28/2021    6:27 PM 10/27/2021    5:17 PM  Last 3 Weights  Weight (lbs) 180 lb 3.2 oz 183 lb 6.8 oz 184 lb 1.4 oz  Weight (kg) 81.738 kg 83.2 kg 83.5 kg     Body mass index is 28.22 kg/m.  General:  Well nourished, well developed, in no acute distress HEENT: normal Neck: no JVD Vascular: No carotid bruits; Distal pulses 2+ bilaterally Cardiac:  normal S1, S2; RRR; no murmur  Lungs:  clear to auscultation bilaterally, no wheezing, rhonchi or rales  Abd: soft, nontender, no hepatomegaly  Ext: no edema Musculoskeletal:  No deformities, BUE and BLE strength normal and equal Skin: warm and dry  Neuro:  CNs 2-12 intact, no focal abnormalities noted Psych:  Normal affect   EKG:  The EKG was personally reviewed and demonstrates:  NSR with LVH, no significant ST-T wave changes Telemetry:  Telemetry was personally reviewed and demonstrates:   NSR without significant ventricular  Relevant CV Studies:  Echo 10/28/2021  1. Left ventricular ejection fraction, by estimation, is 20%. The left  ventricle has severely decreased function. The left ventricle demonstrates  global hypokinesis. Left ventricular diastolic parameters are consistent  with Grade II diastolic  dysfunction (pseudonormalization).   2. Right ventricular systolic function is moderately reduced. The right  ventricular size is normal. Tricuspid regurgitation signal is inadequate  for assessing PA pressure.   3. Left atrial size was mildly dilated.   4. The mitral valve is degenerative. Trivial mitral valve regurgitation.  No evidence of mitral stenosis.   5. The aortic valve is abnormal. There is severe calcifcation of the  aortic valve. Aortic valve regurgitation is mild to moderate. Low flow low  gradient aortic valve stenosis, with at least moderate aortic valve  stenosis with SVI of 19 and DI 0.27.  Aortic valve mean gradient measures 12.3 mmHg. Aortic valve Vmax measures  2.38 m/s.   6. The inferior vena cava is dilated in size with <50% respiratory  variability, suggesting right atrial pressure of 15 mmHg.   Comparison(s): A prior study was performed on 12/21/20. Prior images  reviewed side by side. LVEF has decreased. Definity contrast not  performed. Consider AV calcium scoring for further evaluation of AV  stenosis.   Laboratory Data:  High Sensitivity Troponin:   Recent Labs  Lab 10/27/21 2350 10/28/21 0150  TROPONINIHS 84* 79*     Chemistry Recent Labs  Lab 10/27/21 1707 10/28/21 0416 10/29/21 0412  NA 139  --  138  K  4.0  --  3.7  CL 108  --  104  CO2 23  --  24  GLUCOSE 130*  --  149*  BUN 16  --  25*  CREATININE 1.38* 1.35* 1.47*  CALCIUM 8.7*  --  8.7*  MG  --  1.9  --   GFRNONAA 55* 56* 51*  ANIONGAP 8  --  10    No results for input(s): "PROT", "ALBUMIN", "AST", "ALT", "ALKPHOS", "BILITOT" in the last 168 hours. Lipids No  results for input(s): "CHOL", "TRIG", "HDL", "LABVLDL", "LDLCALC", "CHOLHDL" in the last 168 hours.  Hematology Recent Labs  Lab 10/27/21 1707 10/28/21 0416  WBC 5.6 6.0  RBC 4.87 4.72  HGB 13.3 12.9*  HCT 41.4 40.1  MCV 85.0 85.0  MCH 27.3 27.3  MCHC 32.1 32.2  RDW 16.3* 16.4*  PLT 325 324   Thyroid  Recent Labs  Lab 10/28/21 0416  TSH 2.565    BNP Recent Labs  Lab 10/27/21 1707  BNP 1,165.9*    DDimer No results for input(s): "DDIMER" in the last 168 hours.   Radiology/Studies:  ECHOCARDIOGRAM COMPLETE  Result Date: 10/29/2021    ECHOCARDIOGRAM REPORT   Patient Name:   Caleb [name] Date of Exam: 10/28/2021 Medical Rec #:  [id number]       Height:       67.0 in Accession #:    [id number]      Weight:       184.1 lb Date of Birth:  [birth year]        BSA:          1.952 m Patient Age:    57 years        BP:           119/87 mmHg Patient Gender: M               HR:           88 bpm. Exam Location:  Inpatient Procedure: 2D Echo, Cardiac Doppler and Color Doppler Indications:    Congestive Heart Failure I50.9  History:        Patient has prior history of Echocardiogram examinations, most                 recent 12/21/2020. CHF, CAD, Aortic Valve Disease; Risk                 Factors:Hypertension, Dyslipidemia and Diabetes.  Sonographer:    Ronny Flurry Sonographer#2:  Meagan Baucom RDCS, FE, PE Referring Phys: [place]  1. Left ventricular ejection fraction, by estimation, is 20%. The left ventricle has severely decreased function. The left ventricle demonstrates global hypokinesis. Left ventricular diastolic parameters are consistent with Grade II diastolic dysfunction (pseudonormalization).  2. Right ventricular systolic function is moderately reduced. The right ventricular size is normal. Tricuspid regurgitation signal is inadequate for assessing PA pressure.  3. Left atrial size was mildly dilated.  4. The mitral valve is degenerative. Trivial mitral  valve regurgitation. No evidence of mitral stenosis.  5. The aortic valve is abnormal. There is severe calcifcation of the aortic valve. Aortic valve regurgitation is mild to moderate. Low flow low gradient aortic valve stenosis, with at least moderate aortic valve stenosis with SVI of 19 and DI 0.27. Aortic valve mean gradient measures 12.3 mmHg. Aortic valve Vmax measures 2.38 m/s.  6. The inferior vena cava is dilated in size with <50% respiratory variability, suggesting right atrial pressure of 15 mmHg. Comparison(s):  A prior study was performed on 12/21/20. Prior images reviewed side by side. LVEF has decreased. Definity contrast not performed. Consider AV calcium scoring for further evaluation of AV stenosis. FINDINGS  Left Ventricle: Left ventricular ejection fraction, by estimation, is 20%. The left ventricle has severely decreased function. The left ventricle demonstrates global hypokinesis. The left ventricular internal cavity size was normal in size. There is no left ventricular hypertrophy. Left ventricular diastolic parameters are consistent with Grade II diastolic dysfunction (pseudonormalization). Right Ventricle: The right ventricular size is normal. No increase in right ventricular wall thickness. Right ventricular systolic function is moderately reduced. Tricuspid regurgitation signal is inadequate for assessing PA pressure. The tricuspid regurgitant velocity is 1.56 m/s, and with an assumed right atrial pressure of 15 mmHg, the estimated right ventricular systolic pressure is 16.1 mmHg. Left Atrium: Left atrial size was mildly dilated. Right Atrium: Right atrial size was normal in size. Pericardium: Trivial pericardial effusion is present. Mitral Valve: The mitral valve is degenerative in appearance. Mild to moderate mitral annular calcification. Trivial mitral valve regurgitation. No evidence of mitral valve stenosis. The mean mitral valve gradient is 2.0 mmHg with average heart rate of 80 bpm.  Tricuspid Valve: The tricuspid valve is normal in structure. Tricuspid valve regurgitation is mild. Aortic Valve: The aortic valve is abnormal. There is severe calcifcation of the aortic valve. Aortic valve regurgitation is mild to moderate. Moderate aortic stenosis is present. Aortic valve mean gradient measures 12.3 mmHg. Aortic valve peak gradient measures 22.7 mmHg. Aortic valve area, by VTI measures 0.83 cm. Pulmonic Valve: The pulmonic valve was normal in structure. Pulmonic valve regurgitation is mild. Aorta: The aortic root is normal in size and structure. Venous: The inferior vena cava is dilated in size with less than 50% respiratory variability, suggesting right atrial pressure of 15 mmHg. IAS/Shunts: The interatrial septum was not well visualized.  LEFT VENTRICLE PLAX 2D LVIDd:         4.70 cm   Diastology LVIDs:         3.70 cm   LV e' medial:    3.69 cm/s LV PW:         1.10 cm   LV E/e' medial:  23.1 LV IVS:        1.15 cm   LV e' lateral:   6.06 cm/s LVOT diam:     2.00 cm   LV E/e' lateral: 14.0 LV SV:         36 LV SV Index:   19 LVOT Area:     3.14 cm  RIGHT VENTRICLE RV S prime:     7.71 cm/s TAPSE (M-mode): 1.1 cm LEFT ATRIUM             Index        RIGHT ATRIUM           Index LA diam:        4.60 cm 2.36 cm/m   RA Area:     18.40 cm LA Vol (A2C):   73.0 ml 37.39 ml/m  RA Volume:   51.40 ml  26.33 ml/m LA Vol (A4C):   57.1 ml 29.25 ml/m LA Biplane Vol: 66.6 ml 34.12 ml/m  AORTIC VALVE AV Area (Vmax):    1.00 cm AV Area (Vmean):   0.90 cm AV Area (VTI):     0.83 cm AV Vmax:           238.10 cm/s AV Vmean:  162.067 cm/s AV VTI:            0.437 m AV Peak Grad:      22.7 mmHg AV Mean Grad:      12.3 mmHg LVOT Vmax:         75.80 cm/s LVOT Vmean:        46.400 cm/s LVOT VTI:          0.116 m LVOT/AV VTI ratio: 0.27  AORTA Ao Root diam: 3.20 cm Ao Asc diam:  3.40 cm MITRAL VALVE               TRICUSPID VALVE MV Area (PHT): 3.46 cm    TR Peak grad:   9.7 mmHg MV Mean grad:  2.0  mmHg    TR Vmax:        156.00 cm/s MV Decel Time: 219 msec MV E velocity: 85.10 cm/s  SHUNTS MV A velocity: 66.00 cm/s  Systemic VTI:  0.12 m MV E/A ratio:  1.29        Systemic Diam: 2.00 cm Cherlynn Kaiser MD Electronically signed by Cherlynn Kaiser MD Signature Date/Time: 10/29/2021/8:03:59 AM    Final    CT Angio Chest PE W and/or Wo Contrast  Result Date: 10/28/2021 CLINICAL DATA:  Pulmonary embolism (PE) suspected, unknown D-dimer SOB, CHF, recent prolonged travel. EXAM: CT ANGIOGRAPHY CHEST WITH CONTRAST TECHNIQUE: Multidetector CT imaging of the chest was performed using the standard protocol during bolus administration of intravenous contrast. Multiplanar CT image reconstructions and MIPs were obtained to evaluate the vascular anatomy. RADIATION DOSE REDUCTION: This exam was performed according to the departmental dose-optimization program which includes automated exposure control, adjustment of the mA and/or kV according to patient size and/or use of iterative reconstruction technique. CONTRAST:  31m OMNIPAQUE IOHEXOL 350 MG/ML SOLN COMPARISON:  None Available. FINDINGS: Cardiovascular: There is adequate opacification of the pulmonary arterial tree. No intraluminal filling defect identified to suggest acute pulmonary embolism. Central pulmonary arteries are of normal caliber. Extensive multi-vessel coronary artery calcification. Mild global cardiomegaly. Calcification of the aortic valve leaflets noted. No pericardial effusion. The thoracic aorta is not opacified but is of normal caliber. Mild atherosclerotic calcification. Mediastinum/Nodes: No enlarged mediastinal, hilar, or axillary lymph nodes. Thyroid gland, trachea, and esophagus demonstrate no significant findings. Lungs/Pleura: Moderate right and small left pleural effusions are present. There is thickening of the peribronchovascular interstitium, smooth interlobular septal thickening, and scattered peripheral nodular ground-glass opacities  all suggesting mild-to-moderate interstitial pulmonary edema. Altogether, these findings suggest changes of mild cardiogenic failure. No pneumothorax. No central obstructing lesion. Upper Abdomen: No acute abnormality. Musculoskeletal: No acute abnormality. Review of the MIP images confirms the above findings. IMPRESSION: 1. No pulmonary embolism. 2. Mild global cardiomegaly. Extensive multi-vessel coronary artery calcification. 3. Calcification of the aortic valve leaflets. Echocardiography may be helpful for further evaluation, if indicated. 4. Moderate right and small left pleural effusions with associated pulmonary interstitial edema. Altogether, these findings suggest changes of mild cardiogenic failure. Aortic Atherosclerosis (ICD10-I70.0). Electronically Signed   By: AFidela SalisburyM.D.   On: 10/28/2021 01:22   DG Chest 2 View  Result Date: 10/27/2021 CLINICAL DATA:  sob EXAM: CHEST - 2 VIEW COMPARISON:  Aug 12, 2012 FINDINGS: Mild cardiomegaly. Thoracic aorta is ectatic. There is bilateral perihilar stranding and peribronchial cuffing seen. No consolidation or pleural effusion. Moderate thoracic spondylosis. IMPRESSION: Mild cardiomegaly. There is perihilar stranding and perihilar cuffing seen of likely bronchitis. Superimposed infiltrations cannot be excluded. Electronically Signed   By: AHyman Hopes  Amaresh M.D.   On: 10/27/2021 17:49     Assessment and Plan:   Acute systolic heart failure   - EF dropped down to 20% on echocardiogram 10/28/2021 when compared to the previous echocardiogram in October 2022.  Patient described dyspnea with minimal exertion for the past 2 weeks after 16-hour drive to [place] and back.  CTA was negative for PE.  Echocardiogram showed enlarged LV and RV.  -Will discuss with MD, etiology could be due to multivessel disease combined with worsening valve issue.  Would recommend proceed with left and right heart cath today   -Patient appears to be euvolemic after IV diuresis for  the past 2 days. I/O -4.7.   Aortic stenosis: Low-flow aortic stenosis noted on echocardiogram, measure aortic stenosis gradient during cath  Hypertension: Currently on carvedilol, lisinopril and spironolactone.  Prior to discharge, may consider switching lisinopril to Entresto and add Wilder Glade to medical regimen  Hyperlipidemia: On Lipitor  DM2: Sliding scale insulin  PAD: Left lower extremity intervention back in 2021.  He denies any claudication symptoms.  Did undergo left fourth and fifth toe amputation in 2021.  Carotid artery disease: Followed by vascular surgery   Risk Assessment/Risk Scores:        New York Heart Association (NYHA) Functional Class NYHA Class III        For questions or updates, please contact [facility] Please consult [email or web address] for contact info under    Signed, Almyra Deforest, [place]  10/29/2021 11:36 AM  Agree with note by Almyra Deforest PA-C  Patient well-known to me with severe PAD status post multiple interventions.  We have been following his moderate aortic stenosis.  He has had ejection fractions that have been varying from 40 to 50%.  Other problems include treated hypertension, hyperlipidemia and type 2 diabetes.  He recently took a long trip down to [place] and back and has had progressive weight gain and shortness of breath.  A chest CT ruled out pulmonary emboli but did show coronary calcification.  He really denies chest pain.  Has been diuresed and feels clinically improved.  2D echo revealed a decline in his EF to 20% with probably low-flow severe aortic stenosis.  I think he would benefit from right left heart cath to define his anatomy and physiology.   Lorretta Harp, M.D., [place], [facility], [place]verta [place] Perla [address] [place], [place]  [address]  [phone] 10/29/2021 12:40 PM

## 2021-10-29 NOTE — H&P (View-Only) (Signed)
Cardiology Consultation:   Patient ID: REG BIRCHER MRN: [id number]; DOB: 1950/12/26  Admit date: 10/27/2021 Date of Consult: 10/29/2021  PCP:  [name], MD   [facility] Providers Cardiologist:  Quay Burow, MD        Patient Profile:   Caleb [name] is a 71 y.o. male with a hx of PAD, carotid artery disease, hypertension, hyperlipidemia, and DM2 who is being seen 10/29/2021 for the evaluation of LV dysfunction at the request of Dr. Tana Coast.  History of Present Illness:   [name] is a 71 year old male with past medical history of PAD, carotid artery disease, hypertension, hyperlipidemia, and DM2.  Patient was initially referred to cardiology service for evaluation of critical limb ischemia.  He underwent peripheral angiography in May 2021 and underwent drug-coated balloon angioplasty for high-grade right SFA and the popliteal stenosis with two-vessel runoff.  Carotid Doppler showed high-grade right greater than left ICA stenosis, he was referred to vascular surgery.  Echocardiogram in June 2021 showed EF dropped down to 40 to 45% with moderate to severe aortic stenosis.  Myoview obtained on 08/30/2019 showed inferior scar without ischemia.  Due to tingling in the left fourth toe, he underwent repeat lower extremity angiography that revealed occluded left popliteal artery with occluded posterior tibial and peroneal with high-grade AT disease, he underwent left popliteal CTO intervention on 11/18/2019 with self-expanding stent.  He did have left fourth and fifth toe amputation by Dr. Posey Pronto on 11/22/2019.  Since then, he continues to be very active.  Repeat echocardiogram obtained on 12/21/2020 showed EF improved to 50-55%, grade 1 DD, moderate aortic stenosis.  More recently, since about 2 weeks ago, he noticed some orthopnea symptoms.  He subsequently drove his grandson for 16 hours down to [facility] before coming back.  After he came back to [place], he started noticing  significant shortness of breath with minimal exertion.  He eventually sought medical attention at [facility], ED on 10/27/2021.  On arrival, he was hypertensive with systolic blood pressure greater than 190s.  BNP was 1165.9.  Serial troponin was 84--> 79.  Patient denied any chest pain.  Chest x-ray showed mild cardiomegaly with perihilar stranding.  CTA of the chest obtained on 10/28/2021 showed no PE, mild global cardiomegaly, extensive multivessel CAD, moderate right, small left pleural effusion with associated pulmonary interstitial edema.  TSH was normal.  Subsequent echocardiogram obtained on same day showed EF down to 20%, global hypokinesis, grade 2 DD, moderately reduced RV systolic function, trivial MR, mild to moderate AI, low-flow low gradient aortic valve stenosis which was at least moderate.  Cardiology service consulted for LV dysfunction and heart failure.   Past Medical History:  Diagnosis Date   Aortic stenosis    Carotid artery occlusion    Diabetes mellitus without complication ([place])    Dyslipidemia 09/27/2012   Erectile dysfunction 09/27/2012   Heart murmur    Hypercholesteremia    Hyperlipidemia 08/12/2012   Hypertension    Hyponatremia 08/14/2012   Neuropathy    Pancreatitis    Pancreatitis, acute 09/27/2012   Peripheral vascular disease ([place])    Vitamin D deficiency 06/23/2015    Past Surgical History:  Procedure Laterality Date   ABDOMINAL AORTOGRAM W/LOWER EXTREMITY N/A 08/08/2019   Procedure: ABDOMINAL AORTOGRAM W/LOWER EXTREMITY;  Surgeon: Lorretta Harp, MD;  Location: [place] [facility];  Service: Cardiovascular;  Laterality: N/A;   ABDOMINAL AORTOGRAM W/LOWER EXTREMITY N/A 11/18/2019   Procedure: ABDOMINAL AORTOGRAM W/LOWER EXTREMITY;  Surgeon:  Lorretta Harp, MD;  Location: [place] [facility];  Service: Cardiovascular;  Laterality: N/A;   COLONOSCOPY  12 years ago   in [place] clinic= normal exam per pt   KNEE SURGERY     PERIPHERAL VASCULAR INTERVENTION Right  08/08/2019   Procedure: PERIPHERAL VASCULAR INTERVENTION;  Surgeon: Lorretta Harp, MD;  Location: [place] [facility];  Service: Cardiovascular;  Laterality: Right;   PERIPHERAL VASCULAR INTERVENTION Left 11/18/2019   Procedure: PERIPHERAL VASCULAR INTERVENTION;  Surgeon: Lorretta Harp, MD;  Location: [place] [facility];  Service: Cardiovascular;  Laterality: Left;  left popliteal artery   TRANSCAROTID ARTERY REVASCULARIZATION  Left 12/16/2019   Procedure: TRANSCAROTID ARTERY REVASCULARIZATION;  Surgeon: Elam Dutch, MD;  Location: [place];  Service: Vascular;  Laterality: Left;   TRANSCAROTID ARTERY REVASCULARIZATION  Right 02/03/2020   Procedure: RIGHT TRANSCAROTID ARTERY REVASCULARIZATION;  Surgeon: Elam Dutch, MD;  Location: [place];  Service: Vascular;  Laterality: Right;   ULTRASOUND GUIDANCE FOR VASCULAR ACCESS Right 12/16/2019   Procedure: ULTRASOUND GUIDANCE FOR VASCULAR ACCESS;  Surgeon: Elam Dutch, MD;  Location: [place];  Service: Vascular;  Laterality: Right;   ULTRASOUND GUIDANCE FOR VASCULAR ACCESS Right 02/03/2020   Procedure: ULTRASOUND GUIDANCE FOR VASCULAR ACCESS;  Surgeon: Elam Dutch, MD;  Location: [facility] OR;  Service: Vascular;  Laterality: Right;   VASECTOMY       Home Medications:  Prior to Admission medications   Medication Sig Start Date End Date Taking? Authorizing Provider  amLODipine (NORVASC) 5 MG tablet Take 1 tablet (5 mg total) by mouth daily. 07/22/21   [name], MD  aspirin EC 81 MG tablet Take 81 mg by mouth daily.    [provider]  atorvastatin (LIPITOR) 40 MG tablet Take by mouth. 07/03/20   [provider]  clopidogrel (PLAVIX) 75 MG tablet Take 1 tablet (75 mg total) by mouth daily. 01/19/21 01/19/22  [name], MD  Empagliflozin-metFORMIN HCl 12.07-998 MG TABS Take 2 tablets by mouth daily in the afternoon. 09/29/21   Shamleffer, Melanie Crazier, MD  gabapentin (NEURONTIN) 300 MG capsule TAKE 1  CAPSULE TWICE DAILY 09/29/20   [name], MD  gabapentin (NEURONTIN) 800 MG tablet Take 1 tablet by mouth 2 (two) times daily. 07/03/20   [provider]  insulin NPH-regular Human (NOVOLIN 70/30) (70-30) 100 UNIT/ML injection Inject 12 Units into the skin 2 (two) times daily with a meal. Patient taking differently: Inject 6 Units into the skin 2 (two) times daily with a meal. 07/22/20   [name], MD  lisinopril (ZESTRIL) 10 MG tablet Take 1 tablet by mouth daily at 6 (six) AM. 08/24/21   [provider]  metoprolol tartrate (LOPRESSOR) 50 MG tablet Take 1 tablet (50 mg total) by mouth 2 (two) times daily. 07/22/21   [name], MD  rosuvastatin (CRESTOR) 40 MG tablet Take 1 tablet (40 mg total) by mouth daily. To lower cholesterol 07/01/20   [name], MD  vitamin B-12 (CYANOCOBALAMIN) 500 MCG tablet Take 500 mcg by mouth daily. 08/24/21   [provider]    Inpatient Medications: Scheduled Meds:  aspirin EC  81 mg Oral Daily   atorvastatin  40 mg Oral Daily   carvedilol  12.5 mg Oral BID WC   clopidogrel  75 mg Oral Daily   enoxaparin (LOVENOX) injection  40 mg Subcutaneous Daily   furosemide  40 mg Oral Daily   gabapentin  800 mg Oral QHS   insulin aspart  0-9 Units Subcutaneous TID WC   insulin aspart protamine- aspart  7 Units Subcutaneous Q breakfast   insulin aspart protamine- aspart  8 Units Subcutaneous Q supper   lisinopril  10 mg Oral Daily   spironolactone  12.5 mg Oral Daily   Continuous Infusions:  PRN Meds: acetaminophen **OR** acetaminophen  Allergies:    Allergies  Allergen Reactions   Percocet [Oxycodone-Acetaminophen] Nausea And Vomiting    10/325. Pt has 3 doses of this medication. N/V, temperature 97, no chills and slight sweating. Pt dressing was C/D/I no drainage noted.     Social History:   Social History   Socioeconomic History   Marital status: Widowed    Spouse name: Not on file   Number of children: Not on  file   Years of education: Not on file   Highest education level: Not on file  Occupational History   Occupation: Caregiver    Comment: Special needs  Tobacco Use   Smoking status: Never   Smokeless tobacco: Never  Vaping Use   Vaping Use: Never used  Substance and Sexual Activity   Alcohol use: Not Currently    Comment: rare   Drug use: No   Sexual activity: Not Currently  Other Topics Concern   Not on file  Social History Narrative   Widower.  3 daughters and 3 grandchildren.     Social Determinants of Health   Financial Resource Strain: Low Risk  (12/06/2020)   Overall Financial Resource Strain (CARDIA)    Difficulty of Paying Living Expenses: Not hard at all  Food Insecurity: No Food Insecurity (06/09/2021)   Hunger Vital Sign    Worried About Running Out of Food in the Last Year: Never true    Ran Out of Food in the Last Year: Never true  Transportation Needs: No Transportation Needs (06/09/2021)   PRAPARE - Hydrologist (Medical): No    Lack of Transportation (Non-Medical): No  Physical Activity: Sufficiently Active (12/06/2020)   Exercise Vital Sign    Days of Exercise per Week: 4 days    Minutes of Exercise per Session: 60 min  Stress: No Stress Concern Present (12/06/2020)   [place]    Feeling of Stress : Not at all  Social Connections: Moderately Isolated (12/06/2020)   Social Connection and Isolation Panel [NHANES]    Frequency of Communication with Friends and Family: More than three times a week    Frequency of Social Gatherings with Friends and Family: More than three times a week    Attends Religious Services: More than 4 times per year    Active Member of [employer] or Organizations: No    Attends Archivist Meetings: Never    Marital Status: Widowed  Intimate Partner Violence: Not At Risk (12/06/2020)   Humiliation, Afraid, Rape, and Kick questionnaire     Fear of Current or Ex-Partner: No    Emotionally Abused: No    Physically Abused: No    Sexually Abused: No    Family History:    Family History  Problem Relation Age of Onset   CAD Father    Hypertension Father    Alcohol abuse Father        Cause of death   Diabetes Mother    Colon polyps Mother    CAD Brother 34       CABG   Colon cancer Neg Hx    Esophageal cancer Neg  Hx    Rectal cancer Neg Hx    Stomach cancer Neg Hx      ROS:  Please see the history of present illness.   All other ROS reviewed and negative.     Physical Exam/Data:   Vitals:   10/28/21 2314 10/29/21 0442 10/29/21 0700 10/29/21 0815  BP: (S) (!) 136/91 (S) (!) 158/104 (S) (!) 140/94 (!) 147/88  Pulse:    88  Resp: 18   19  Temp: 98.3 F (36.8 C) (!) 97.4 F (36.3 C)  98 F (36.7 C)  TempSrc: Oral Oral  Oral  SpO2: 100% 100%  100%  Weight:  81.7 kg    Height:        Intake/Output Summary (Last 24 hours) at 10/29/2021 1136 Last data filed at 10/29/2021 0700 Gross per 24 hour  Intake 720 ml  Output 2550 ml  Net -1830 ml      10/29/2021    4:42 AM 10/28/2021    6:27 PM 10/27/2021    5:17 PM  Last 3 Weights  Weight (lbs) 180 lb 3.2 oz 183 lb 6.8 oz 184 lb 1.4 oz  Weight (kg) 81.738 kg 83.2 kg 83.5 kg     Body mass index is 28.22 kg/m.  General:  Well nourished, well developed, in no acute distress HEENT: normal Neck: no JVD Vascular: No carotid bruits; Distal pulses 2+ bilaterally Cardiac:  normal S1, S2; RRR; no murmur  Lungs:  clear to auscultation bilaterally, no wheezing, rhonchi or rales  Abd: soft, nontender, no hepatomegaly  Ext: no edema Musculoskeletal:  No deformities, BUE and BLE strength normal and equal Skin: warm and dry  Neuro:  CNs 2-12 intact, no focal abnormalities noted Psych:  Normal affect   EKG:  The EKG was personally reviewed and demonstrates:  NSR with LVH, no significant ST-T wave changes Telemetry:  Telemetry was personally reviewed and demonstrates:   NSR without significant ventricular  Relevant CV Studies:  Echo 10/28/2021  1. Left ventricular ejection fraction, by estimation, is 20%. The left  ventricle has severely decreased function. The left ventricle demonstrates  global hypokinesis. Left ventricular diastolic parameters are consistent  with Grade II diastolic  dysfunction (pseudonormalization).   2. Right ventricular systolic function is moderately reduced. The right  ventricular size is normal. Tricuspid regurgitation signal is inadequate  for assessing PA pressure.   3. Left atrial size was mildly dilated.   4. The mitral valve is degenerative. Trivial mitral valve regurgitation.  No evidence of mitral stenosis.   5. The aortic valve is abnormal. There is severe calcifcation of the  aortic valve. Aortic valve regurgitation is mild to moderate. Low flow low  gradient aortic valve stenosis, with at least moderate aortic valve  stenosis with SVI of 19 and DI 0.27.  Aortic valve mean gradient measures 12.3 mmHg. Aortic valve Vmax measures  2.38 m/s.   6. The inferior vena cava is dilated in size with <50% respiratory  variability, suggesting right atrial pressure of 15 mmHg.   Comparison(s): A prior study was performed on 12/21/20. Prior images  reviewed side by side. LVEF has decreased. Definity contrast not  performed. Consider AV calcium scoring for further evaluation of AV  stenosis.   Laboratory Data:  High Sensitivity Troponin:   Recent Labs  Lab 10/27/21 2350 10/28/21 0150  TROPONINIHS 84* 79*     Chemistry Recent Labs  Lab 10/27/21 1707 10/28/21 0416 10/29/21 0412  NA 139  --  138  K  4.0  --  3.7  CL 108  --  104  CO2 23  --  24  GLUCOSE 130*  --  149*  BUN 16  --  25*  CREATININE 1.38* 1.35* 1.47*  CALCIUM 8.7*  --  8.7*  MG  --  1.9  --   GFRNONAA 55* 56* 51*  ANIONGAP 8  --  10    No results for input(s): "PROT", "ALBUMIN", "AST", "ALT", "ALKPHOS", "BILITOT" in the last 168 hours. Lipids No  results for input(s): "CHOL", "TRIG", "HDL", "LABVLDL", "LDLCALC", "CHOLHDL" in the last 168 hours.  Hematology Recent Labs  Lab 10/27/21 1707 10/28/21 0416  WBC 5.6 6.0  RBC 4.87 4.72  HGB 13.3 12.9*  HCT 41.4 40.1  MCV 85.0 85.0  MCH 27.3 27.3  MCHC 32.1 32.2  RDW 16.3* 16.4*  PLT 325 324   Thyroid  Recent Labs  Lab 10/28/21 0416  TSH 2.565    BNP Recent Labs  Lab 10/27/21 1707  BNP 1,165.9*    DDimer No results for input(s): "DDIMER" in the last 168 hours.   Radiology/Studies:  ECHOCARDIOGRAM COMPLETE  Result Date: 10/29/2021    ECHOCARDIOGRAM REPORT   Patient Name:   RAMADAN COUEY Date of Exam: 10/28/2021 Medical Rec #:  [id number]       Height:       67.0 in Accession #:    [id number]      Weight:       184.1 lb Date of Birth:  May 29, 1950        BSA:          1.952 m Patient Age:    68 years        BP:           119/87 mmHg Patient Gender: M               HR:           88 bpm. Exam Location:  Inpatient Procedure: 2D Echo, Cardiac Doppler and Color Doppler Indications:    Congestive Heart Failure I50.9  History:        Patient has prior history of Echocardiogram examinations, most                 recent 12/21/2020. CHF, CAD, Aortic Valve Disease; Risk                 Factors:Hypertension, Dyslipidemia and Diabetes.  Sonographer:    Ronny Flurry Sonographer#2:  Meagan Baucom RDCS, FE, PE Referring Phys: [place]  1. Left ventricular ejection fraction, by estimation, is 20%. The left ventricle has severely decreased function. The left ventricle demonstrates global hypokinesis. Left ventricular diastolic parameters are consistent with Grade II diastolic dysfunction (pseudonormalization).  2. Right ventricular systolic function is moderately reduced. The right ventricular size is normal. Tricuspid regurgitation signal is inadequate for assessing PA pressure.  3. Left atrial size was mildly dilated.  4. The mitral valve is degenerative. Trivial mitral  valve regurgitation. No evidence of mitral stenosis.  5. The aortic valve is abnormal. There is severe calcifcation of the aortic valve. Aortic valve regurgitation is mild to moderate. Low flow low gradient aortic valve stenosis, with at least moderate aortic valve stenosis with SVI of 19 and DI 0.27. Aortic valve mean gradient measures 12.3 mmHg. Aortic valve Vmax measures 2.38 m/s.  6. The inferior vena cava is dilated in size with <50% respiratory variability, suggesting right atrial pressure of 15 mmHg. Comparison(s):  A prior study was performed on 12/21/20. Prior images reviewed side by side. LVEF has decreased. Definity contrast not performed. Consider AV calcium scoring for further evaluation of AV stenosis. FINDINGS  Left Ventricle: Left ventricular ejection fraction, by estimation, is 20%. The left ventricle has severely decreased function. The left ventricle demonstrates global hypokinesis. The left ventricular internal cavity size was normal in size. There is no left ventricular hypertrophy. Left ventricular diastolic parameters are consistent with Grade II diastolic dysfunction (pseudonormalization). Right Ventricle: The right ventricular size is normal. No increase in right ventricular wall thickness. Right ventricular systolic function is moderately reduced. Tricuspid regurgitation signal is inadequate for assessing PA pressure. The tricuspid regurgitant velocity is 1.56 m/s, and with an assumed right atrial pressure of 15 mmHg, the estimated right ventricular systolic pressure is 94.8 mmHg. Left Atrium: Left atrial size was mildly dilated. Right Atrium: Right atrial size was normal in size. Pericardium: Trivial pericardial effusion is present. Mitral Valve: The mitral valve is degenerative in appearance. Mild to moderate mitral annular calcification. Trivial mitral valve regurgitation. No evidence of mitral valve stenosis. The mean mitral valve gradient is 2.0 mmHg with average heart rate of 80 bpm.  Tricuspid Valve: The tricuspid valve is normal in structure. Tricuspid valve regurgitation is mild. Aortic Valve: The aortic valve is abnormal. There is severe calcifcation of the aortic valve. Aortic valve regurgitation is mild to moderate. Moderate aortic stenosis is present. Aortic valve mean gradient measures 12.3 mmHg. Aortic valve peak gradient measures 22.7 mmHg. Aortic valve area, by VTI measures 0.83 cm. Pulmonic Valve: The pulmonic valve was normal in structure. Pulmonic valve regurgitation is mild. Aorta: The aortic root is normal in size and structure. Venous: The inferior vena cava is dilated in size with less than 50% respiratory variability, suggesting right atrial pressure of 15 mmHg. IAS/Shunts: The interatrial septum was not well visualized.  LEFT VENTRICLE PLAX 2D LVIDd:         4.70 cm   Diastology LVIDs:         3.70 cm   LV e' medial:    3.69 cm/s LV PW:         1.10 cm   LV E/e' medial:  23.1 LV IVS:        1.15 cm   LV e' lateral:   6.06 cm/s LVOT diam:     2.00 cm   LV E/e' lateral: 14.0 LV SV:         36 LV SV Index:   19 LVOT Area:     3.14 cm  RIGHT VENTRICLE RV S prime:     7.71 cm/s TAPSE (M-mode): 1.1 cm LEFT ATRIUM             Index        RIGHT ATRIUM           Index LA diam:        4.60 cm 2.36 cm/m   RA Area:     18.40 cm LA Vol (A2C):   73.0 ml 37.39 ml/m  RA Volume:   51.40 ml  26.33 ml/m LA Vol (A4C):   57.1 ml 29.25 ml/m LA Biplane Vol: 66.6 ml 34.12 ml/m  AORTIC VALVE AV Area (Vmax):    1.00 cm AV Area (Vmean):   0.90 cm AV Area (VTI):     0.83 cm AV Vmax:           238.10 cm/s AV Vmean:  162.067 cm/s AV VTI:            0.437 m AV Peak Grad:      22.7 mmHg AV Mean Grad:      12.3 mmHg LVOT Vmax:         75.80 cm/s LVOT Vmean:        46.400 cm/s LVOT VTI:          0.116 m LVOT/AV VTI ratio: 0.27  AORTA Ao Root diam: 3.20 cm Ao Asc diam:  3.40 cm MITRAL VALVE               TRICUSPID VALVE MV Area (PHT): 3.46 cm    TR Peak grad:   9.7 mmHg MV Mean grad:  2.0  mmHg    TR Vmax:        156.00 cm/s MV Decel Time: 219 msec MV E velocity: 85.10 cm/s  SHUNTS MV A velocity: 66.00 cm/s  Systemic VTI:  0.12 m MV E/A ratio:  1.29        Systemic Diam: 2.00 cm Cherlynn Kaiser MD Electronically signed by Cherlynn Kaiser MD Signature Date/Time: 10/29/2021/8:03:59 AM    Final    CT Angio Chest PE W and/or Wo Contrast  Result Date: 10/28/2021 CLINICAL DATA:  Pulmonary embolism (PE) suspected, unknown D-dimer SOB, CHF, recent prolonged travel. EXAM: CT ANGIOGRAPHY CHEST WITH CONTRAST TECHNIQUE: Multidetector CT imaging of the chest was performed using the standard protocol during bolus administration of intravenous contrast. Multiplanar CT image reconstructions and MIPs were obtained to evaluate the vascular anatomy. RADIATION DOSE REDUCTION: This exam was performed according to the departmental dose-optimization program which includes automated exposure control, adjustment of the mA and/or kV according to patient size and/or use of iterative reconstruction technique. CONTRAST:  71m OMNIPAQUE IOHEXOL 350 MG/ML SOLN COMPARISON:  None Available. FINDINGS: Cardiovascular: There is adequate opacification of the pulmonary arterial tree. No intraluminal filling defect identified to suggest acute pulmonary embolism. Central pulmonary arteries are of normal caliber. Extensive multi-vessel coronary artery calcification. Mild global cardiomegaly. Calcification of the aortic valve leaflets noted. No pericardial effusion. The thoracic aorta is not opacified but is of normal caliber. Mild atherosclerotic calcification. Mediastinum/Nodes: No enlarged mediastinal, hilar, or axillary lymph nodes. Thyroid gland, trachea, and esophagus demonstrate no significant findings. Lungs/Pleura: Moderate right and small left pleural effusions are present. There is thickening of the peribronchovascular interstitium, smooth interlobular septal thickening, and scattered peripheral nodular ground-glass opacities  all suggesting mild-to-moderate interstitial pulmonary edema. Altogether, these findings suggest changes of mild cardiogenic failure. No pneumothorax. No central obstructing lesion. Upper Abdomen: No acute abnormality. Musculoskeletal: No acute abnormality. Review of the MIP images confirms the above findings. IMPRESSION: 1. No pulmonary embolism. 2. Mild global cardiomegaly. Extensive multi-vessel coronary artery calcification. 3. Calcification of the aortic valve leaflets. Echocardiography may be helpful for further evaluation, if indicated. 4. Moderate right and small left pleural effusions with associated pulmonary interstitial edema. Altogether, these findings suggest changes of mild cardiogenic failure. Aortic Atherosclerosis (ICD10-I70.0). Electronically Signed   By: AFidela SalisburyM.D.   On: 10/28/2021 01:22   DG Chest 2 View  Result Date: 10/27/2021 CLINICAL DATA:  sob EXAM: CHEST - 2 VIEW COMPARISON:  Aug 12, 2012 FINDINGS: Mild cardiomegaly. Thoracic aorta is ectatic. There is bilateral perihilar stranding and peribronchial cuffing seen. No consolidation or pleural effusion. Moderate thoracic spondylosis. IMPRESSION: Mild cardiomegaly. There is perihilar stranding and perihilar cuffing seen of likely bronchitis. Superimposed infiltrations cannot be excluded. Electronically Signed   By: AHyman Hopes  Amaresh M.D.   On: 10/27/2021 17:49     Assessment and Plan:   Acute systolic heart failure   - EF dropped down to 20% on echocardiogram 10/28/2021 when compared to the previous echocardiogram in October 2022.  Patient described dyspnea with minimal exertion for the past 2 weeks after 16-hour drive to [place] and back.  CTA was negative for PE.  Echocardiogram showed enlarged LV and RV.  -Will discuss with MD, etiology could be due to multivessel disease combined with worsening valve issue.  Would recommend proceed with left and right heart cath today   -Patient appears to be euvolemic after IV diuresis for  the past 2 days. I/O -4.7.   Aortic stenosis: Low-flow aortic stenosis noted on echocardiogram, measure aortic stenosis gradient during cath  Hypertension: Currently on carvedilol, lisinopril and spironolactone.  Prior to discharge, may consider switching lisinopril to Entresto and add Wilder Glade to medical regimen  Hyperlipidemia: On Lipitor  DM2: Sliding scale insulin  PAD: Left lower extremity intervention back in 2021.  He denies any claudication symptoms.  Did undergo left fourth and fifth toe amputation in 2021.  Carotid artery disease: Followed by vascular surgery   Risk Assessment/Risk Scores:        New York Heart Association (NYHA) Functional Class NYHA Class III        For questions or updates, please contact [facility] Please consult [email or web address] for contact info under    Signed, Almyra Deforest, Katie  10/29/2021 11:36 AM  Agree with note by Almyra Deforest PA-C  Patient well-known to me with severe PAD status post multiple interventions.  We have been following his moderate aortic stenosis.  He has had ejection fractions that have been varying from 40 to 50%.  Other problems include treated hypertension, hyperlipidemia and type 2 diabetes.  He recently took a long trip down to [place] and back and has had progressive weight gain and shortness of breath.  A chest CT ruled out pulmonary emboli but did show coronary calcification.  He really denies chest pain.  Has been diuresed and feels clinically improved.  2D echo revealed a decline in his EF to 20% with probably low-flow severe aortic stenosis.  I think he would benefit from right left heart cath to define his anatomy and physiology.   Lorretta Harp, M.D., [place], [facility], [place] [address] [place], [place]  [address]  [phone] 10/29/2021 12:40 PM

## 2021-10-29 NOTE — Interval H&P Note (Signed)
History and Physical Interval Note:  10/29/2021 1:09 PM  Caleb [name]  has presented today for surgery, with the diagnosis of heart failure.  The various methods of treatment have been discussed with the patient and family. After consideration of risks, benefits and other options for treatment, the patient has consented to  Procedure(s): RIGHT/LEFT HEART CATH AND CORONARY ANGIOGRAPHY (N/A) as a surgical intervention.  The patient's history has been reviewed, patient examined, no change in status, stable for surgery.  I have reviewed the patient's chart and labs.  Questions were answered to the patient's satisfaction.    Cath Lab Visit (complete for each Cath Lab visit)  Clinical Evaluation Leading to the Procedure:   ACS: Yes.    Non-ACS:    Anginal Classification: CCS II  Anti-ischemic medical therapy: Maximal Therapy (2 or more classes of medications)  Non-Invasive Test Results: No non-invasive testing performed  Prior CABG: No previous CABG        Caleb [name]

## 2021-10-30 ENCOUNTER — Other Ambulatory Visit: Payer: Self-pay | Admitting: Physician Assistant

## 2021-10-30 DIAGNOSIS — E785 Hyperlipidemia, unspecified: Secondary | ICD-10-CM

## 2021-10-30 DIAGNOSIS — E1169 Type 2 diabetes mellitus with other specified complication: Secondary | ICD-10-CM

## 2021-10-30 DIAGNOSIS — N179 Acute kidney failure, unspecified: Secondary | ICD-10-CM

## 2021-10-30 DIAGNOSIS — I5023 Acute on chronic systolic (congestive) heart failure: Secondary | ICD-10-CM

## 2021-10-30 DIAGNOSIS — I739 Peripheral vascular disease, unspecified: Secondary | ICD-10-CM

## 2021-10-30 DIAGNOSIS — I5033 Acute on chronic diastolic (congestive) heart failure: Secondary | ICD-10-CM | POA: Diagnosis not present

## 2021-10-30 DIAGNOSIS — N189 Chronic kidney disease, unspecified: Secondary | ICD-10-CM

## 2021-10-30 LAB — BASIC METABOLIC PANEL
Anion gap: 8 (ref 5–15)
BUN: 28 mg/dL — ABNORMAL HIGH (ref 8–23)
CO2: 26 mmol/L (ref 22–32)
Calcium: 8.4 mg/dL — ABNORMAL LOW (ref 8.9–10.3)
Chloride: 103 mmol/L (ref 98–111)
Creatinine, Ser: 1.51 mg/dL — ABNORMAL HIGH (ref 0.61–1.24)
GFR, Estimated: 49 mL/min — ABNORMAL LOW (ref >60)
Glucose, Bld: 144 mg/dL — ABNORMAL HIGH (ref 70–99)
Potassium: 3.6 mmol/L (ref 3.5–5.1)
Sodium: 137 mmol/L (ref 135–145)

## 2021-10-30 LAB — GLUCOSE, CAPILLARY
Glucose-Capillary: 137 mg/dL — ABNORMAL HIGH (ref 70–99)
Glucose-Capillary: 166 mg/dL — ABNORMAL HIGH (ref 70–99)

## 2021-10-30 MED ORDER — SACUBITRIL-VALSARTAN 24-26 MG PO TABS: 1.0000 | ORAL_TABLET | Freq: Two times a day (BID) | ORAL | 0 refills | Status: AC

## 2021-10-30 MED ORDER — FUROSEMIDE 40 MG PO TABS: 40.0000 mg | ORAL_TABLET | Freq: Every day | ORAL | 0 refills | Status: DC

## 2021-10-30 MED ORDER — EMPAGLIFLOZIN 25 MG PO TABS
25.0000 mg | ORAL_TABLET | Freq: Every day | ORAL | Status: DC
  Administered 2021-10-30: 25 mg via ORAL
  Filled 2021-10-30: qty 1

## 2021-10-30 MED ORDER — CARVEDILOL 12.5 MG PO TABS: 12.5000 mg | ORAL_TABLET | Freq: Two times a day (BID) | ORAL | 0 refills | Status: DC

## 2021-10-30 MED ORDER — SACUBITRIL-VALSARTAN 24-26 MG PO TABS: 1.0000 | ORAL_TABLET | Freq: Two times a day (BID) | ORAL | Status: DC

## 2021-10-30 MED ORDER — SPIRONOLACTONE 25 MG PO TABS: 12.5000 mg | ORAL_TABLET | Freq: Every day | ORAL | 0 refills | Status: DC

## 2021-10-30 NOTE — Progress Notes (Signed)
Progress Note  Patient Name: Caleb [name] Date of Encounter: 10/30/2021  Primary Cardiologist: Quay Burow, MD   Subjective   Overnight had LHC showing mild non obstructive CAD Patient notes that he is nearly back to normal. No CP, SOB, Palpitations.  Nursing noted that he looked a little SOB after his walk.  At baseline, he was doing the rowing machine for 60 minutes this summer.  Inpatient Medications    Scheduled Meds:  aspirin EC  81 mg Oral Daily   atorvastatin  40 mg Oral Daily   carvedilol  12.5 mg Oral BID WC   clopidogrel  75 mg Oral Daily   enoxaparin (LOVENOX) injection  40 mg Subcutaneous Daily   furosemide  40 mg Oral Daily   gabapentin  800 mg Oral QHS   insulin aspart  0-9 Units Subcutaneous TID WC   insulin aspart protamine- aspart  7 Units Subcutaneous Q breakfast   insulin aspart protamine- aspart  8 Units Subcutaneous Q supper   sacubitril-valsartan  1 tablet Oral BID   sodium chloride flush  3 mL Intravenous Q12H   sodium chloride flush  3 mL Intravenous Q12H   spironolactone  12.5 mg Oral Daily   Continuous Infusions:  sodium chloride     PRN Meds: sodium chloride, acetaminophen **OR** acetaminophen, sodium chloride flush   Vital Signs    Vitals:   10/29/21 2000 10/29/21 2100 10/30/21 0340 10/30/21 0830  BP: (!) 149/87 101/78 122/62 137/82  Pulse:    88  Resp: '16  17 19  '$ Temp: 98.1 F (36.7 C)  98 F (36.7 C) 97.6 F (36.4 C)  TempSrc: Oral  Oral Oral  SpO2: 100%   97%  Weight:   82.7 kg   Height:        Intake/Output Summary (Last 24 hours) at 10/30/2021 1030 Last data filed at 10/29/2021 2000 Gross per 24 hour  Intake 340 ml  Output 500 ml  Net -160 ml   Filed Weights   10/28/21 1827 10/29/21 0442 10/30/21 0340  Weight: 83.2 kg 81.7 kg 82.7 kg    Telemetry    SR - Personally Reviewed  Physical Exam   Gen: no distress   Neck: No JVD Cardiac: No Rubs or Gallops, systolic murmur, RRR +2 radial pulses no Radial  hematoma or bruit on R access site Respiratory: Clear to auscultation bilaterally, normal effort, normal  respiratory rate GI: Soft, nontender, non-distended  MS: No  edema;  moves all extremities Integument: Skin feels warm Neuro:  At time of evaluation, alert and oriented to person/place/time/situation  Psych: Normal affect, patient feels great  Labs    Chemistry Recent Labs  Lab 10/27/21 1707 10/28/21 0416 10/29/21 0412 10/29/21 1338 10/29/21 1341 10/29/21 1342 10/30/21 0310  NA 139  --  138   < > 140 140 137  K 4.0  --  3.7   < > 3.5 3.6 3.6  CL 108  --  104  --   --   --  103  CO2 23  --  24  --   --   --  26  GLUCOSE 130*  --  149*  --   --   --  144*  BUN 16  --  25*  --   --   --  28*  CREATININE 1.38* 1.35* 1.47*  --   --   --  1.51*  CALCIUM 8.7*  --  8.7*  --   --   --  8.4*  GFRNONAA 55* 56* 51*  --   --   --  49*  ANIONGAP 8  --  10  --   --   --  8   < > = values in this interval not displayed.     Hematology Recent Labs  Lab 10/27/21 1707 10/28/21 0416 10/29/21 1338 10/29/21 1341 10/29/21 1342  WBC 5.6 6.0  --   --   --   RBC 4.87 4.72  --   --   --   HGB 13.3 12.9* 14.3 13.9 14.3  HCT 41.4 40.1 42.0 41.0 42.0  MCV 85.0 85.0  --   --   --   MCH 27.3 27.3  --   --   --   MCHC 32.1 32.2  --   --   --   RDW 16.3* 16.4*  --   --   --   PLT 325 324  --   --   --     Cardiac EnzymesNo results for input(s): "TROPONINI" in the last 168 hours. No results for input(s): "TROPIPOC" in the last 168 hours.   BNP Recent Labs  Lab 10/27/21 1707  BNP 1,165.9*     DDimer No results for input(s): "DDIMER" in the last 168 hours.   Radiology    CARDIAC CATHETERIZATION  Result Date: 10/29/2021   Prox RCA lesion is 60% stenosed.   Ramus lesion is 40% stenosed.   2nd Mrg lesion is 60% stenosed.   Mid LAD lesion is 30% stenosed.   Dist LAD lesion is 40% stenosed. Mild non-obstructive disease in the mid and distal LAD Mild non-obstructive disease in the  Circumflex and Ramus intermediate branches The RCA is a large dominant vessel with moderate proximal to mid stenosis that does not appear to be flow limiting. Non-ischemic cardiomyopathy Severe low flow/low gradient aortic stenosis by echo Elevated right heart pressures. Recommendations: I would recommend medical management of his non-obstructive CAD. He would be a candidate for TAVR or surgical AVR but given low EF, would favor TAVR. I would recommend continued diuresis today and if he feels well tomorrow, he could be discharged home and then come back for outpatient pre-TAVR CT scans. I would not plan the CT scans this weekend given his CKD. Our structural heart team will contact him to arrange the scans and the visit with our CT surgeon.   ECHOCARDIOGRAM COMPLETE  Result Date: 10/29/2021    ECHOCARDIOGRAM REPORT   Patient Name:   Caleb [name] Date of Exam: 10/28/2021 Medical Rec #:  [id number]       Height:       67.0 in Accession #:    [id number]      Weight:       184.1 lb Date of Birth:  December 15, 1950        BSA:          1.952 m Patient Age:    71 years        BP:           119/87 mmHg Patient Gender: M               HR:           88 bpm. Exam Location:  Inpatient Procedure: 2D Echo, Cardiac Doppler and Color Doppler Indications:    Congestive Heart Failure I50.9  History:        Patient has prior history of Echocardiogram examinations, most  recent 12/21/2020. CHF, CAD, Aortic Valve Disease; Risk                 Factors:Hypertension, Dyslipidemia and Diabetes.  Sonographer:    Ronny Flurry Sonographer#2:  Meagan Baucom RDCS, FE, PE Referring Phys: [place]  1. Left ventricular ejection fraction, by estimation, is 20%. The left ventricle has severely decreased function. The left ventricle demonstrates global hypokinesis. Left ventricular diastolic parameters are consistent with Grade II diastolic dysfunction (pseudonormalization).  2. Right ventricular systolic  function is moderately reduced. The right ventricular size is normal. Tricuspid regurgitation signal is inadequate for assessing PA pressure.  3. Left atrial size was mildly dilated.  4. The mitral valve is degenerative. Trivial mitral valve regurgitation. No evidence of mitral stenosis.  5. The aortic valve is abnormal. There is severe calcifcation of the aortic valve. Aortic valve regurgitation is mild to moderate. Low flow low gradient aortic valve stenosis, with at least moderate aortic valve stenosis with SVI of 19 and DI 0.27. Aortic valve mean gradient measures 12.3 mmHg. Aortic valve Vmax measures 2.38 m/s.  6. The inferior vena cava is dilated in size with <50% respiratory variability, suggesting right atrial pressure of 15 mmHg. Comparison(s): A prior study was performed on 12/21/20. Prior images reviewed side by side. LVEF has decreased. Definity contrast not performed. Consider AV calcium scoring for further evaluation of AV stenosis. FINDINGS  Left Ventricle: Left ventricular ejection fraction, by estimation, is 20%. The left ventricle has severely decreased function. The left ventricle demonstrates global hypokinesis. The left ventricular internal cavity size was normal in size. There is no left ventricular hypertrophy. Left ventricular diastolic parameters are consistent with Grade II diastolic dysfunction (pseudonormalization). Right Ventricle: The right ventricular size is normal. No increase in right ventricular wall thickness. Right ventricular systolic function is moderately reduced. Tricuspid regurgitation signal is inadequate for assessing PA pressure. The tricuspid regurgitant velocity is 1.56 m/s, and with an assumed right atrial pressure of 15 mmHg, the estimated right ventricular systolic pressure is 35.3 mmHg. Left Atrium: Left atrial size was mildly dilated. Right Atrium: Right atrial size was normal in size. Pericardium: Trivial pericardial effusion is present. Mitral Valve: The mitral  valve is degenerative in appearance. Mild to moderate mitral annular calcification. Trivial mitral valve regurgitation. No evidence of mitral valve stenosis. The mean mitral valve gradient is 2.0 mmHg with average heart rate of 80 bpm. Tricuspid Valve: The tricuspid valve is normal in structure. Tricuspid valve regurgitation is mild. Aortic Valve: The aortic valve is abnormal. There is severe calcifcation of the aortic valve. Aortic valve regurgitation is mild to moderate. Moderate aortic stenosis is present. Aortic valve mean gradient measures 12.3 mmHg. Aortic valve peak gradient measures 22.7 mmHg. Aortic valve area, by VTI measures 0.83 cm. Pulmonic Valve: The pulmonic valve was normal in structure. Pulmonic valve regurgitation is mild. Aorta: The aortic root is normal in size and structure. Venous: The inferior vena cava is dilated in size with less than 50% respiratory variability, suggesting right atrial pressure of 15 mmHg. IAS/Shunts: The interatrial septum was not well visualized.  LEFT VENTRICLE PLAX 2D LVIDd:         4.70 cm   Diastology LVIDs:         3.70 cm   LV e' medial:    3.69 cm/s LV PW:         1.10 cm   LV E/e' medial:  23.1 LV IVS:  1.15 cm   LV e' lateral:   6.06 cm/s LVOT diam:     2.00 cm   LV E/e' lateral: 14.0 LV SV:         36 LV SV Index:   19 LVOT Area:     3.14 cm  RIGHT VENTRICLE RV S prime:     7.71 cm/s TAPSE (M-mode): 1.1 cm LEFT ATRIUM             Index        RIGHT ATRIUM           Index LA diam:        4.60 cm 2.36 cm/m   RA Area:     18.40 cm LA Vol (A2C):   73.0 ml 37.39 ml/m  RA Volume:   51.40 ml  26.33 ml/m LA Vol (A4C):   57.1 ml 29.25 ml/m LA Biplane Vol: 66.6 ml 34.12 ml/m  AORTIC VALVE AV Area (Vmax):    1.00 cm AV Area (Vmean):   0.90 cm AV Area (VTI):     0.83 cm AV Vmax:           238.10 cm/s AV Vmean:          162.067 cm/s AV VTI:            0.437 m AV Peak Grad:      22.7 mmHg AV Mean Grad:      12.3 mmHg LVOT Vmax:         75.80 cm/s LVOT Vmean:         46.400 cm/s LVOT VTI:          0.116 m LVOT/AV VTI ratio: 0.27  AORTA Ao Root diam: 3.20 cm Ao Asc diam:  3.40 cm MITRAL VALVE               TRICUSPID VALVE MV Area (PHT): 3.46 cm    TR Peak grad:   9.7 mmHg MV Mean grad:  2.0 mmHg    TR Vmax:        156.00 cm/s MV Decel Time: 219 msec MV E velocity: 85.10 cm/s  SHUNTS MV A velocity: 66.00 cm/s  Systemic VTI:  0.12 m MV E/A ratio:  1.29        Systemic Diam: 2.00 cm Cherlynn Kaiser MD Electronically signed by Cherlynn Kaiser MD Signature Date/Time: 10/29/2021/8:03:59 AM    Final       Patient Profile     71 y.o. male worsening HF in the setting of LFLGAS  Assessment & Plan     Heart Failure Reduced Ejection Fraction  Complicated by Moderate to Severe LFLGAS With prior PAD, Carotid disease HTN with DM And HLD with DM - NYHA class I, Stage C, euvolemic, etiology unclear; has notable AS - Diuretic regimen: Lasix 40 mg PO daily - continue DAPT for now - LDL in 2022 < 55 on atorvastatin 40 mg PO daily - continue coreg 12.5 mg PO BID - Jardiance 25 mg PO daily - aldactone 12.5 mg PO daily - ON MONDAY, will start Entresto 24/26; has BMP for Wednesday; patient understands  - Discussed the importance of fluid restriction of < 2 L, salt restriction, and checking daily weights   - Will reach out to get structural f/u - HF TOC planned for later this month  Discussed with patient and Dr. Cathlean Sauer; likely DC today    For questions or updates, please contact [facility] Please consult [email or web address] for contact info under  Cardiology/STEMI.      Rudean Haskell, MD [place], [address] [place], [place] [address] [phone]  10:30 AM

## 2021-10-30 NOTE — Progress Notes (Signed)
Pt NSR on monitor and A/O x 4. OOB independent. Admitted for CHF. See note. R.wrist is clean, dry, intact, and soft. No evidence of bleeding or infection. Denies pain and SOB. Education provided on meds and follow up care. Spoke to pt on picking up meds from [facility]. Pt agreeable. Pt adequate for discharge

## 2021-10-30 NOTE — Care Management (Signed)
Provided with Entresto coupon.

## 2021-10-30 NOTE — Assessment & Plan Note (Signed)
ckd stage 3 a with base serum cr at 1,4   Patient was placed on furosemide for diuresis with good toleration.  At the time of his discharge his volume has improved, his serum cr is 1,51 with K at 3,6 and serum bicarbonate at 26  Plan to continue diuresis with furosemide and spironolactone Start Entresto as outpatient.  Follow up renal function and electrolytes as outpatient.

## 2021-10-30 NOTE — Discharge Summary (Signed)
Physician Discharge Summary   Patient: Caleb [name] MRN: [id number] DOB: 10-May-1950  Admit date:     10/27/2021  Discharge date: 10/30/21  Discharge Physician: Jimmy Picket Ulani Degrasse   PCP: [name], MD   Recommendations at discharge:    Patient has been placed on furosemide and spironolactone Metoprolol changed to carvedilol Plan to start Entresto on 11/01/21.  Discontinue lisinopril and amlodipine.  Follow up renal function and electrolytes on 11/03/21.  Follow up with cardiology as outpatient for TAVR.   Discharge Diagnoses: Principal Problem:   Acute on chronic systolic CHF (congestive heart failure) (HCC) Active Problems:   Hypertensive urgency   Peripheral vascular disease (HCC)   Type 2 diabetes mellitus with hyperlipidemia (HCC)   Acute kidney injury superimposed on chronic kidney disease ([place])  Resolved Problems:   * No resolved hospital problems. [facility] Course: [name] was admitted to the hospital with the working of decompensated heart failure.   71 year old M with HTN, aortic stenosis, peripheral vascular disease status popliteal and carotid stents who presented with 3 days shortness of breath, chest tightness after spending 48 hours driving to [place] and then [place] and then home. On his initial physical examination his blood pressure was 176/113, HR 104, RR 22 and 02 saturation 98% on supplemental 02. Lungs with no wheezing or rhonchi, heart with S1 and S2 present and rhythmic, positive systolic murmur at the base, abdomen not distended, no lower extremity edema.   Na 139, K 4,0 Cl 108, bicarbonate 23, glucose 130, bun 16 cr 1,38  BNP 1,165  High sensitive troponin 84 and 79 Wbc 6.0 hgb 12,9 plt 324   Chest radiograph with mild cardiomegaly, bilateral hilar vascular congestion and cephalization of the vasculature. Fluid in the right fissure.  Ct chest with bilateral ground glass opacities, small to moderate right pleural effusion with  compression atelectasis.  No pulmonary embolism. Calcification of the aortic valve.   EKG 96 bpm, right axis deviation, normal intervals, sinus rhythm , with PAC, no significant ST segment or T wave changes, positive LVH.   Patient was placed on IV furosemide for diuresis. Further work up with echocardiogram showed reduction in LV systolic function, EF 78% and severe aortic stenosis.   Cardiac catheterization with coronary artery disease but non ischemic cardiomyopathy. Severe aortic stenosis.  Patient will follow up as outpatient for possible TAVR.   Assessment and Plan: * Acute on chronic systolic CHF (congestive heart failure) (HCC) Echocardiogram with reduced LV systolic function, EF 67%, global hypokinesis, RV systolic function with moderate reduction in systolic function, not able to assess PA pressures. Aortic valve with moderate stenosis and mild to moderate regurgitation. Severe calcification of aortic valve. Trivial pericardial effusion.   08/11 cardiac catheterization with mild non obstructive disease mid to distal LAD. Mild non obstructive disease in the circumflex and ramus intermediate branches. RCA is a large dominant vessel with moderate proximal to mid stenosis that does not appear to be flow limiting.  Severe low flow/ low gradient aortic stenosis  Elevated right heart pressures.   Patient was treated with IV furosemide for diuresis, negative fluid balance was achieved, - 4,980 ml, with improvement in his symptoms.   Patient was evaluated by cardiology and recommendations for TAVR.  Patient will follow up as outpatient for aortic intervention.   Continue diuresis with furosemide, heart failure regimen with carvedilol, empagliflozin, spironolactone, and start Entresto on 11/01/21.  Follow up renal function on 11/03/21.   Hypertensive urgency Patient has responded well  to diuresis, he has been placed on heart failure medical regimen with good toleration.  At the time of  his discharge his systolic blood pressure is 120 to 130 mmHg.   Plan to continue carvedilol, and diuretic therapy with furosemide and spironolactone.  To start on Entresto as outpatient.   Peripheral vascular disease (HCC) No acute decompensation. Continue with aspirin, clopidogrel and statin therapy. Continue blood pressure control.   Type 2 diabetes mellitus with hyperlipidemia ([place]) Patient was placed on insulin therapy with good toleration.  His fasting glucose at the time of his discharge is 144.   Continue with statin therapy.   Acute kidney injury superimposed on chronic kidney disease (HCC) ckd stage 3 a with base serum cr at 1,4   Patient was placed on furosemide for diuresis with good toleration.  At the time of his discharge his volume has improved, his serum cr is 1,51 with K at 3,6 and serum bicarbonate at 26  Plan to continue diuresis with furosemide and spironolactone Start Entresto as outpatient.  Follow up renal function and electrolytes as outpatient.          Consultants: cardiology  Procedures performed: heart catheterization   Disposition: Home Diet recommendation:  Discharge Diet Orders (From admission, onward)     Start     Ordered   10/30/21 0000  Diet - low sodium heart healthy        10/30/21 1239           Cardiac and Carb modified diet DISCHARGE MEDICATION: Allergies as of 10/30/2021       Reactions   Percocet [oxycodone-acetaminophen] Nausea And Vomiting        Medication List     STOP taking these medications    amLODipine 5 MG tablet Commonly known as: NORVASC   lisinopril 10 MG tablet Commonly known as: ZESTRIL   metoprolol tartrate 50 MG tablet Commonly known as: LOPRESSOR   rosuvastatin 40 MG tablet Commonly known as: CRESTOR       TAKE these medications    aspirin EC 81 MG tablet Take 81 mg by mouth daily.   atorvastatin 40 MG tablet Commonly known as: LIPITOR Take 40 mg by mouth daily.    carvedilol 12.5 MG tablet Commonly known as: COREG Take 1 tablet (12.5 mg total) by mouth 2 (two) times daily with a meal.   clopidogrel 75 MG tablet Commonly known as: PLAVIX Take 1 tablet (75 mg total) by mouth daily.   cyanocobalamin 500 MCG tablet Commonly known as: VITAMIN B12 Take 500 mcg by mouth daily.   eucerin cream Apply 1 Application topically 2 (two) times daily as needed for dry skin.   ferrous sulfate 325 (65 FE) MG tablet Take 325 mg by mouth daily.   furosemide 40 MG tablet Commonly known as: LASIX Take 1 tablet (40 mg total) by mouth daily. Start taking on: October 31, 2021   gabapentin 800 MG tablet Commonly known as: NEURONTIN Take 800 mg by mouth 2 (two) times daily.   NovoLIN 70/30 (70-30) 100 UNIT/ML injection Generic drug: insulin NPH-regular Human Inject 12 Units into the skin 2 (two) times daily with a meal. What changed: how much to take   PRESCRIPTION MEDICATION Inject 0.1-1 mLs as directed See admin instructions. Tri-Mix Standard Strength 5 ml. Formula: Prostaglandin 10 mcg/ml, Papaverine 30 mg/ml, Phentolamine 1 mg/ml. Inject 0.1 ml into side of penis as directed as needed (to be injected immediately before sexual intercourse) may increase the dose by 0.1  ml every 48 hours to achieve an erection. Max dose 1 ml.   sacubitril-valsartan 24-26 MG Commonly known as: ENTRESTO Take 1 tablet by mouth 2 (two) times daily. Start taking on: November 01, 2021   silver sulfADIAZINE 1 % cream Commonly known as: SILVADENE Apply 1 Application topically 2 (two) times daily.   spironolactone 25 MG tablet Commonly known as: ALDACTONE Take 0.5 tablets (12.5 mg total) by mouth daily. Start taking on: October 31, 2021   Synjardy XR 25-1000 MG Tb24 Generic drug: Empagliflozin-metFORMIN HCl ER Take 1 tablet by mouth daily.        Follow-up Information     [facility]. Go in 13 day(s).   Specialty:  Cardiology Why: Hospital follow up PLEASE bring current medication list to appointment FREE valet parking, Entrance C, off [employer] information: [address] [id number] [place] [address] [phone]               Discharge Exam: Danley Danker Weights   10/28/21 1827 10/29/21 0442 10/30/21 0340  Weight: 83.2 kg 81.7 kg 82.7 kg   BP 137/82 (BP Location: Left Arm)   Pulse 88   Temp 97.6 F (36.4 C) (Oral)   Resp 19   Ht 5' 7.01" (1.702 m)   Wt 82.7 kg   SpO2 97%   BMI 28.55 kg/m   Patient is feeling better, no dyspnea or chest pain  Neurology awake and alert ENT with no pallor Cardiovascular with S1 and S2 present and rhythmic with no gallops or rubs, positive murmur at the right upper sternal border.  No JVD Respiratory with no wheezing, mild rales at base, no rhonchi  Abdomen with no distention No lower extremity edma  Condition at discharge: stable  The results of significant diagnostics from this hospitalization (including imaging, microbiology, ancillary and laboratory) are listed below for reference.   Imaging Studies: CARDIAC CATHETERIZATION  Result Date: 10/29/2021   Prox RCA lesion is 60% stenosed.   Ramus lesion is 40% stenosed.   2nd Mrg lesion is 60% stenosed.   Mid LAD lesion is 30% stenosed.   Dist LAD lesion is 40% stenosed. Mild non-obstructive disease in the mid and distal LAD Mild non-obstructive disease in the Circumflex and Ramus intermediate branches The RCA is a large dominant vessel with moderate proximal to mid stenosis that does not appear to be flow limiting. Non-ischemic cardiomyopathy Severe low flow/low gradient aortic stenosis by echo Elevated right heart pressures. Recommendations: I would recommend medical management of his non-obstructive CAD. He would be a candidate for TAVR or surgical AVR but given low EF, would favor TAVR. I would recommend continued diuresis today and if he feels well tomorrow, he  could be discharged home and then come back for outpatient pre-TAVR CT scans. I would not plan the CT scans this weekend given his CKD. Our structural heart team will contact him to arrange the scans and the visit with our CT surgeon.   ECHOCARDIOGRAM COMPLETE  Result Date: 10/29/2021    ECHOCARDIOGRAM REPORT   Patient Name:   Caleb [name] Date of Exam: 10/28/2021 Medical Rec #:  [id number]       Height:       67.0 in Accession #:    [id number]      Weight:       184.1 lb Date of Birth:  10-31-1950        BSA:  1.952 m Patient Age:    28 years        BP:           119/87 mmHg Patient Gender: M               HR:           88 bpm. Exam Location:  Inpatient Procedure: 2D Echo, Cardiac Doppler and Color Doppler Indications:    Congestive Heart Failure I50.9  History:        Patient has prior history of Echocardiogram examinations, most                 recent 12/21/2020. CHF, CAD, Aortic Valve Disease; Risk                 Factors:Hypertension, Dyslipidemia and Diabetes.  Sonographer:    Ronny Flurry Sonographer#2:  Meagan Baucom RDCS, FE, PE Referring Phys: [place]  1. Left ventricular ejection fraction, by estimation, is 20%. The left ventricle has severely decreased function. The left ventricle demonstrates global hypokinesis. Left ventricular diastolic parameters are consistent with Grade II diastolic dysfunction (pseudonormalization).  2. Right ventricular systolic function is moderately reduced. The right ventricular size is normal. Tricuspid regurgitation signal is inadequate for assessing PA pressure.  3. Left atrial size was mildly dilated.  4. The mitral valve is degenerative. Trivial mitral valve regurgitation. No evidence of mitral stenosis.  5. The aortic valve is abnormal. There is severe calcifcation of the aortic valve. Aortic valve regurgitation is mild to moderate. Low flow low gradient aortic valve stenosis, with at least moderate aortic valve stenosis with  SVI of 19 and DI 0.27. Aortic valve mean gradient measures 12.3 mmHg. Aortic valve Vmax measures 2.38 m/s.  6. The inferior vena cava is dilated in size with <50% respiratory variability, suggesting right atrial pressure of 15 mmHg. Comparison(s): A prior study was performed on 12/21/20. Prior images reviewed side by side. LVEF has decreased. Definity contrast not performed. Consider AV calcium scoring for further evaluation of AV stenosis. FINDINGS  Left Ventricle: Left ventricular ejection fraction, by estimation, is 20%. The left ventricle has severely decreased function. The left ventricle demonstrates global hypokinesis. The left ventricular internal cavity size was normal in size. There is no left ventricular hypertrophy. Left ventricular diastolic parameters are consistent with Grade II diastolic dysfunction (pseudonormalization). Right Ventricle: The right ventricular size is normal. No increase in right ventricular wall thickness. Right ventricular systolic function is moderately reduced. Tricuspid regurgitation signal is inadequate for assessing PA pressure. The tricuspid regurgitant velocity is 1.56 m/s, and with an assumed right atrial pressure of 15 mmHg, the estimated right ventricular systolic pressure is 97.9 mmHg. Left Atrium: Left atrial size was mildly dilated. Right Atrium: Right atrial size was normal in size. Pericardium: Trivial pericardial effusion is present. Mitral Valve: The mitral valve is degenerative in appearance. Mild to moderate mitral annular calcification. Trivial mitral valve regurgitation. No evidence of mitral valve stenosis. The mean mitral valve gradient is 2.0 mmHg with average heart rate of 80 bpm. Tricuspid Valve: The tricuspid valve is normal in structure. Tricuspid valve regurgitation is mild. Aortic Valve: The aortic valve is abnormal. There is severe calcifcation of the aortic valve. Aortic valve regurgitation is mild to moderate. Moderate aortic stenosis is present.  Aortic valve mean gradient measures 12.3 mmHg. Aortic valve peak gradient measures 22.7 mmHg. Aortic valve area, by VTI measures 0.83 cm. Pulmonic Valve: The pulmonic valve was normal in structure. Pulmonic  valve regurgitation is mild. Aorta: The aortic root is normal in size and structure. Venous: The inferior vena cava is dilated in size with less than 50% respiratory variability, suggesting right atrial pressure of 15 mmHg. IAS/Shunts: The interatrial septum was not well visualized.  LEFT VENTRICLE PLAX 2D LVIDd:         4.70 cm   Diastology LVIDs:         3.70 cm   LV e' medial:    3.69 cm/s LV PW:         1.10 cm   LV E/e' medial:  23.1 LV IVS:        1.15 cm   LV e' lateral:   6.06 cm/s LVOT diam:     2.00 cm   LV E/e' lateral: 14.0 LV SV:         36 LV SV Index:   19 LVOT Area:     3.14 cm  RIGHT VENTRICLE RV S prime:     7.71 cm/s TAPSE (M-mode): 1.1 cm LEFT ATRIUM             Index        RIGHT ATRIUM           Index LA diam:        4.60 cm 2.36 cm/m   RA Area:     18.40 cm LA Vol (A2C):   73.0 ml 37.39 ml/m  RA Volume:   51.40 ml  26.33 ml/m LA Vol (A4C):   57.1 ml 29.25 ml/m LA Biplane Vol: 66.6 ml 34.12 ml/m  AORTIC VALVE AV Area (Vmax):    1.00 cm AV Area (Vmean):   0.90 cm AV Area (VTI):     0.83 cm AV Vmax:           238.10 cm/s AV Vmean:          162.067 cm/s AV VTI:            0.437 m AV Peak Grad:      22.7 mmHg AV Mean Grad:      12.3 mmHg LVOT Vmax:         75.80 cm/s LVOT Vmean:        46.400 cm/s LVOT VTI:          0.116 m LVOT/AV VTI ratio: 0.27  AORTA Ao Root diam: 3.20 cm Ao Asc diam:  3.40 cm MITRAL VALVE               TRICUSPID VALVE MV Area (PHT): 3.46 cm    TR Peak grad:   9.7 mmHg MV Mean grad:  2.0 mmHg    TR Vmax:        156.00 cm/s MV Decel Time: 219 msec MV E velocity: 85.10 cm/s  SHUNTS MV A velocity: 66.00 cm/s  Systemic VTI:  0.12 m MV E/A ratio:  1.29        Systemic Diam: 2.00 cm Cherlynn Kaiser MD Electronically signed by Cherlynn Kaiser MD Signature Date/Time:  10/29/2021/8:03:59 AM    Final    CT Angio Chest PE W and/or Wo Contrast  Result Date: 10/28/2021 CLINICAL DATA:  Pulmonary embolism (PE) suspected, unknown D-dimer SOB, CHF, recent prolonged travel. EXAM: CT ANGIOGRAPHY CHEST WITH CONTRAST TECHNIQUE: Multidetector CT imaging of the chest was performed using the standard protocol during bolus administration of intravenous contrast. Multiplanar CT image reconstructions and MIPs were obtained to evaluate the vascular anatomy. RADIATION DOSE REDUCTION: This exam was performed according to the  departmental dose-optimization program which includes automated exposure control, adjustment of the mA and/or kV according to patient size and/or use of iterative reconstruction technique. CONTRAST:  62m OMNIPAQUE IOHEXOL 350 MG/ML SOLN COMPARISON:  None Available. FINDINGS: Cardiovascular: There is adequate opacification of the pulmonary arterial tree. No intraluminal filling defect identified to suggest acute pulmonary embolism. Central pulmonary arteries are of normal caliber. Extensive multi-vessel coronary artery calcification. Mild global cardiomegaly. Calcification of the aortic valve leaflets noted. No pericardial effusion. The thoracic aorta is not opacified but is of normal caliber. Mild atherosclerotic calcification. Mediastinum/Nodes: No enlarged mediastinal, hilar, or axillary lymph nodes. Thyroid gland, trachea, and esophagus demonstrate no significant findings. Lungs/Pleura: Moderate right and small left pleural effusions are present. There is thickening of the peribronchovascular interstitium, smooth interlobular septal thickening, and scattered peripheral nodular ground-glass opacities all suggesting mild-to-moderate interstitial pulmonary edema. Altogether, these findings suggest changes of mild cardiogenic failure. No pneumothorax. No central obstructing lesion. Upper Abdomen: No acute abnormality. Musculoskeletal: No acute abnormality. Review of the MIP  images confirms the above findings. IMPRESSION: 1. No pulmonary embolism. 2. Mild global cardiomegaly. Extensive multi-vessel coronary artery calcification. 3. Calcification of the aortic valve leaflets. Echocardiography may be helpful for further evaluation, if indicated. 4. Moderate right and small left pleural effusions with associated pulmonary interstitial edema. Altogether, these findings suggest changes of mild cardiogenic failure. Aortic Atherosclerosis (ICD10-I70.0). Electronically Signed   By: AFidela SalisburyM.D.   On: 10/28/2021 01:22   DG Chest 2 View  Result Date: 10/27/2021 CLINICAL DATA:  sob EXAM: CHEST - 2 VIEW COMPARISON:  Aug 12, 2012 FINDINGS: Mild cardiomegaly. Thoracic aorta is ectatic. There is bilateral perihilar stranding and peribronchial cuffing seen. No consolidation or pleural effusion. Moderate thoracic spondylosis. IMPRESSION: Mild cardiomegaly. There is perihilar stranding and perihilar cuffing seen of likely bronchitis. Superimposed infiltrations cannot be excluded. Electronically Signed   By: AFrazier RichardsM.D.   On: 10/27/2021 17:49    Microbiology: Results for orders placed or performed during the hospital encounter of 01/31/20  SARS CORONAVIRUS 2 (TAT 6-24 HRS) Nasopharyngeal Nasopharyngeal Swab     Status: None   Collection Time: 01/31/20 11:57 AM   Specimen: Nasopharyngeal Swab  Result Value Ref Range Status   SARS Coronavirus 2 NEGATIVE NEGATIVE Final    Comment: (NOTE) SARS-CoV-2 target nucleic acids are NOT DETECTED.  The SARS-CoV-2 RNA is generally detectable in upper and lower respiratory specimens during the acute phase of infection. Negative results do not preclude SARS-CoV-2 infection, do not rule out co-infections with other pathogens, and should not be used as the sole basis for treatment or other patient management decisions. Negative results must be combined with clinical observations, patient history, and epidemiological information. The  expected result is Negative.  Fact Sheet for Patients: hSugarRoll.be Fact Sheet for Healthcare Providers: hhttps://www.woods-mathews.com/ This test is not yet approved or cleared by the [place] and  has been authorized for detection and/or diagnosis of SARS-CoV-2 by FDA under an Emergency Use Authorization (EUA). This EUA will remain  in effect (meaning this test can be used) for the duration of the COVID-19 declaration under Se ction 564(b)(1) of the Act, 21 U.S.C. section 360bbb-3(b)(1), unless the authorization is terminated or revoked sooner.  Performed at [facility] [id number], [place] [address]    Labs: CBC: Recent Labs  Lab 10/27/21 1707 10/28/21 0416 10/29/21 1338 10/29/21 1341 10/29/21 1342  WBC 5.6 6.0  --   --   --  NEUTROABS 2.8  --   --   --   --   HGB 13.3 12.9* 14.3 13.9 14.3  HCT 41.4 40.1 42.0 41.0 42.0  MCV 85.0 85.0  --   --   --   PLT 325 324  --   --   --    Basic Metabolic Panel: Recent Labs  Lab 10/27/21 1707 10/28/21 0416 10/29/21 0412 10/29/21 1338 10/29/21 1341 10/29/21 1342 10/30/21 0310  NA 139  --  138 141 140 140 137  K 4.0  --  3.7 3.5 3.5 3.6 3.6  CL 108  --  104  --   --   --  103  CO2 23  --  24  --   --   --  26  GLUCOSE 130*  --  149*  --   --   --  144*  BUN 16  --  25*  --   --   --  28*  CREATININE 1.38* 1.35* 1.47*  --   --   --  1.51*  CALCIUM 8.7*  --  8.7*  --   --   --  8.4*  MG  --  1.9  --   --   --   --   --    Liver Function Tests: No results for input(s): "AST", "ALT", "ALKPHOS", "BILITOT", "PROT", "ALBUMIN" in the last 168 hours. CBG: Recent Labs  Lab 10/29/21 1054 10/29/21 1652 10/29/21 2106 10/30/21 0530 10/30/21 1101  GLUCAP 176* 70 181* 137* 166*    Discharge time spent: greater than 30 minutes.  Signed: Tawni Millers, MD [facility] 10/30/2021

## 2021-11-01 ENCOUNTER — Other Ambulatory Visit: Payer: Self-pay | Admitting: *Deleted

## 2021-11-01 ENCOUNTER — Telehealth: Payer: Self-pay

## 2021-11-01 DIAGNOSIS — I350 Nonrheumatic aortic (valve) stenosis: Secondary | ICD-10-CM

## 2021-11-01 NOTE — Patient Outreach (Signed)
  Care Coordination [facility] Note Transition Care Management Follow-up Telephone Call Date of discharge and from where: 10/30/21 [facility] How have you been since you were released from the hospital? Patient states he is feeling a lot better.  Any questions or concerns? No  Items Reviewed: Did the pt receive and understand the discharge instructions provided? Yes  Medications obtained and verified? Yes  Other? No  Any new allergies since your discharge? No  Dietary orders reviewed? Yes Do you have support at home? Yes   Home Care and Equipment/Supplies: Were home health services ordered? no If so, what is the name of the agency? N/A  Has the agency set up a time to come to the patient's home? not applicable Were any new equipment or medical supplies ordered?  No What is the name of the medical supply agency? N/A Were you able to get the supplies/equipment? not applicable Do you have any questions related to the use of the equipment or supplies? No  Functional Questionnaire: (I = Independent and D = Dependent) ADLs: I  Bathing/Dressing- I  Meal Prep- I  Eating- I  Maintaining continence- I  Transferring/Ambulation- I  Managing Meds- I  Follow up appointments reviewed:  PCP Hospital f/u appt confirmed? No  , nurse will inform [facility] f/u appt confirmed? Yes  Scheduled to see Cardiology on 11/12/21 @ 1100. Are transportation arrangements needed? No  If their condition worsens, is the pt aware to call PCP or go to the Emergency Dept.? Yes Was the patient provided with contact information for the PCP's office or ED? Yes Was to pt encouraged to call back with questions or concerns? Yes  SDOH assessments and interventions completed:   Yes  Care Coordination Interventions Activated:  No   Care Coordination Interventions:   N/A     Encounter Outcome:  Pt. Visit Completed    Emelia Loron RN, BSN [place] [phone] [email or web address]

## 2021-11-01 NOTE — Telephone Encounter (Signed)
TOC completed with patient by Emelia Loron, RN.   I called and spoke to patient and scheduled him for an appointment at [facility] with Dr Margarita Rana - 11/18/2021 @ 1050. He said he is doing " wonderful" and has transportation to this appointment.

## 2021-11-01 NOTE — Telephone Encounter (Signed)
Pt referred to the structural heart team for TAVR evaluation.  I spoke with the pt and he is due to have lab work on 8/16 to follow up on kidney function.  I will arrange TAVR CT scans and evaluation with cardiac surgeon.  I asked the pt about his dental health and he states that he has not seen a dentist in a number of years and he has a couple of broken teeth and probably needs 2-3 teeth extracted.  I will place referral to Dr Benson Norway for evaluation and treatment. Pt agreed with plan.

## 2021-11-02 LAB — LIPOPROTEIN A (LPA): Lipoprotein (a): 163.2 nmol/L — ABNORMAL HIGH (ref <75.0)

## 2021-11-03 ENCOUNTER — Other Ambulatory Visit: Payer: Self-pay

## 2021-11-03 DIAGNOSIS — I5023 Acute on chronic systolic (congestive) heart failure: Secondary | ICD-10-CM

## 2021-11-03 LAB — BASIC METABOLIC PANEL
BUN/Creatinine Ratio: 15 (ref 10–24)
BUN: 21 mg/dL (ref 8–27)
CO2: 24 mmol/L (ref 20–29)
Calcium: 9.3 mg/dL (ref 8.6–10.2)
Chloride: 103 mmol/L (ref 96–106)
Creatinine, Ser: 1.42 mg/dL — ABNORMAL HIGH (ref 0.76–1.27)
Glucose: 213 mg/dL — ABNORMAL HIGH (ref 70–99)
Potassium: 5.3 mmol/L — ABNORMAL HIGH (ref 3.5–5.2)
Sodium: 140 mmol/L (ref 134–144)
eGFR: 53 mL/min/{1.73_m2} — ABNORMAL LOW (ref >59)

## 2021-11-04 ENCOUNTER — Other Ambulatory Visit: Payer: Self-pay | Admitting: *Deleted

## 2021-11-04 DIAGNOSIS — E875 Hyperkalemia: Secondary | ICD-10-CM

## 2021-11-08 ENCOUNTER — Other Ambulatory Visit (HOSPITAL_COMMUNITY): Payer: Self-pay | Admitting: Dentistry

## 2021-11-08 ENCOUNTER — Ambulatory Visit (HOSPITAL_COMMUNITY): Admission: RE | Admit: 2021-11-08 | Payer: Medicare HMO | Source: Ambulatory Visit

## 2021-11-09 ENCOUNTER — Encounter (HOSPITAL_COMMUNITY): Payer: Self-pay | Admitting: Dentistry

## 2021-11-09 ENCOUNTER — Ambulatory Visit (INDEPENDENT_AMBULATORY_CARE_PROVIDER_SITE_OTHER): Payer: Medicare HMO | Admitting: Dentistry

## 2021-11-09 VITALS — BP 133/76 | HR 72 | Temp 98.3°F

## 2021-11-09 DIAGNOSIS — K045 Chronic apical periodontitis: Secondary | ICD-10-CM

## 2021-11-09 DIAGNOSIS — K030 Excessive attrition of teeth: Secondary | ICD-10-CM

## 2021-11-09 DIAGNOSIS — Z7902 Long term (current) use of antithrombotics/antiplatelets: Secondary | ICD-10-CM

## 2021-11-09 DIAGNOSIS — M2632 Excessive spacing of fully erupted teeth: Secondary | ICD-10-CM

## 2021-11-09 DIAGNOSIS — K036 Deposits [accretions] on teeth: Secondary | ICD-10-CM

## 2021-11-09 DIAGNOSIS — Z01818 Encounter for other preprocedural examination: Secondary | ICD-10-CM

## 2021-11-09 DIAGNOSIS — M270 Developmental disorders of jaws: Secondary | ICD-10-CM

## 2021-11-09 DIAGNOSIS — F40232 Fear of other medical care: Secondary | ICD-10-CM

## 2021-11-09 DIAGNOSIS — I350 Nonrheumatic aortic (valve) stenosis: Secondary | ICD-10-CM

## 2021-11-09 DIAGNOSIS — K083 Retained dental root: Secondary | ICD-10-CM

## 2021-11-09 DIAGNOSIS — K0530 Chronic periodontitis, unspecified: Secondary | ICD-10-CM

## 2021-11-09 DIAGNOSIS — K08109 Complete loss of teeth, unspecified cause, unspecified class: Secondary | ICD-10-CM

## 2021-11-09 DIAGNOSIS — K0850 Unsatisfactory restoration of tooth, unspecified: Secondary | ICD-10-CM

## 2021-11-09 DIAGNOSIS — K029 Dental caries, unspecified: Secondary | ICD-10-CM

## 2021-11-09 NOTE — Patient Instructions (Signed)
[facility] DEPARTMENT OF DENTAL MEDICINE Dr. Miraj Truss B. Dickey Caamano, DMD Phone: [phone] Fax: [phone]       It was a pleasure seeing you today!  Please refer to the information below regarding your dental visit with us.  Please do not hesitate to give us a call if any questions or concerns come up after you leave.    Thank you for letting us provide care for you.  If there is anything we can do for you, please let us know.    HEART VALVES AND MOUTH CARE  FACTS: If you have any infection in your mouth, it can infect your heart valve. If you heart valve is infected, you will be seriously ill. Infections in the mouth can be SILENT and do not always cause pain. Examples of infections in the mouth are gum disease, dental cavities and abscesses. Some possible signs of infection are:  Bad breath, bleeding gums, or teeth that are sensitive to sweets, hot, and/or cold. There are many other signs as well.     WHAT YOU HAVE TO DO: Brush your teeth after meals and at bedtime.  Spend at least 2 minutes brushing well, especially behind your back teeth and all around your teeth that stand alone.  Brush at the gumline also. Do not go to bed without brushing your teeth and flossing.  If your gums bleed when you brush or floss, do NOT stop brushing or flossing.  Bleeding can be a sign of inflammation or irritation from bacteria.  It usually means that your gums need more attention and better cleaning.  If your dentist or Dr. Nimo Verastegui gave you a prescription mouthwash to use, make sure to use it as directed. If you run out of the medication, notify your pharmacy. If you were given any other medications or directions by your dentist, please follow them.  If you did not understand the directions or forget what you were told, please call.  We will be happy to refresh your memory. If you need antibiotics before dental procedures, make sure you take them one hour prior to  every dental visit as directed.  Get a dental check-up every 4-6 months in order to keep your mouth healthy, or to find and treat any new infection. You will most likely need your teeth cleaned or gums treated at the same time. If you are not able to come in for your scheduled appointment, call your dentist as soon as possible to reschedule. If you have a problem in between dental visits, call your dentist.    WE ARE A TEAM.  OUR GOAL IS: HEALTHY MOUTH, HEALTHY HEART    Questions?  Call our office during office hours at [phone].   
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
## 2021-11-11 ENCOUNTER — Other Ambulatory Visit: Payer: Medicare HMO

## 2021-11-11 DIAGNOSIS — E875 Hyperkalemia: Secondary | ICD-10-CM

## 2021-11-11 IMAGING — CT CT ANGIO CHEST-ABD-PELV FOR DISSECTION W/ AND WO/W CM
2 of 7 series · 14 of 46 positions shown, 16 images · IV contrast (APPLIED)
Comparison: 08/12/2012

CLINICAL DATA: Hypertension with diaphoresis

EXAM:
CT ANGIOGRAPHY CHEST, ABDOMEN AND PELVIS
TECHNIQUE: Non-contrast CT of the chest was initially obtained.

[Series 7: arterial · axial · arterial · 0.97mm/px · z∈[-648,-58]mm · 11 of 331 slices shown, 13 images]
[im 18/331  soft-tissue]
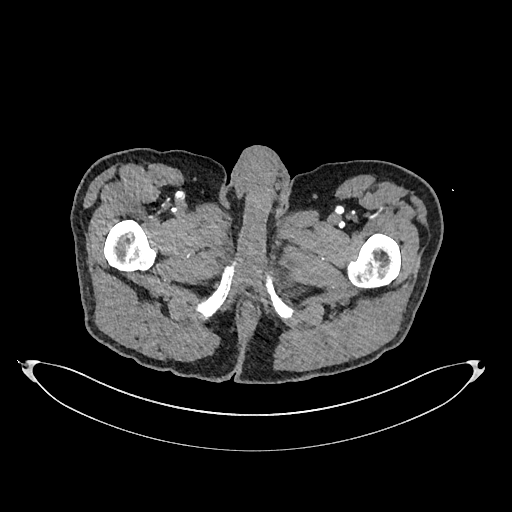
[im 18/331  bone]
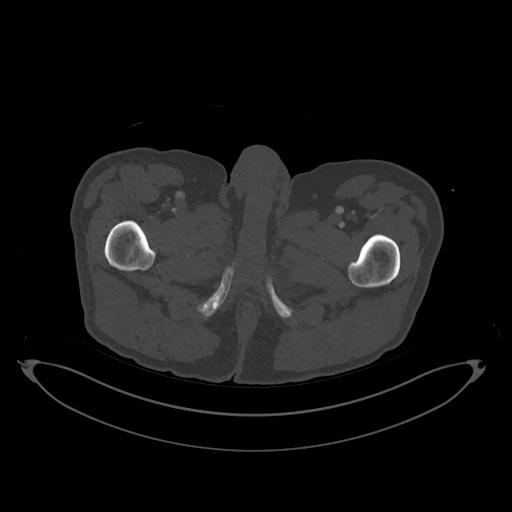
[im 53/331  soft-tissue]
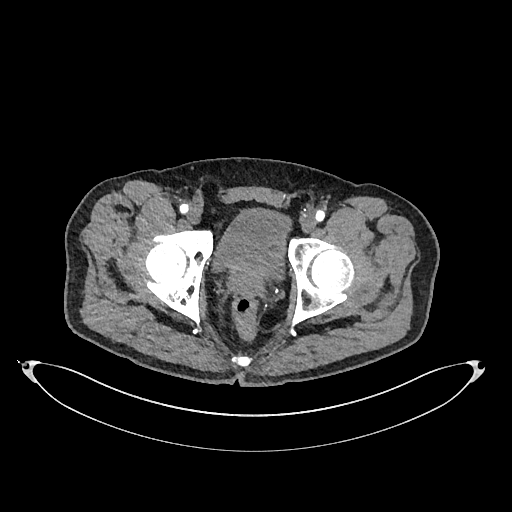
[im 87/331  soft-tissue]
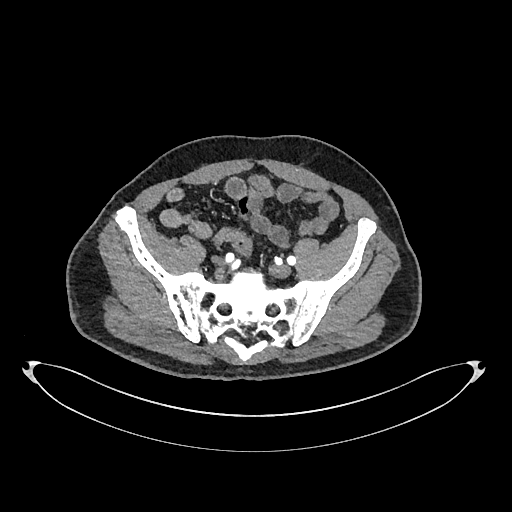
[im 105/331  soft-tissue]
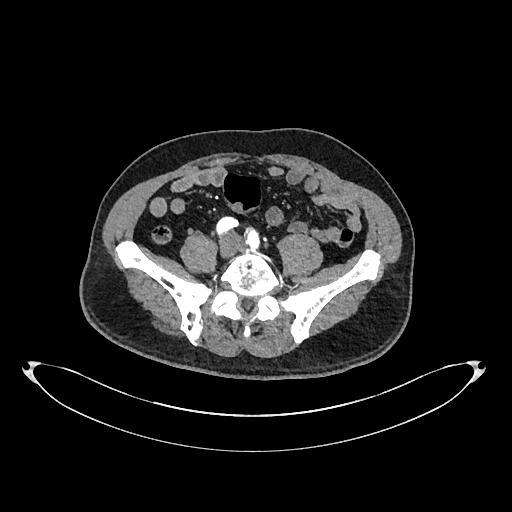
[im 139/331  soft-tissue]
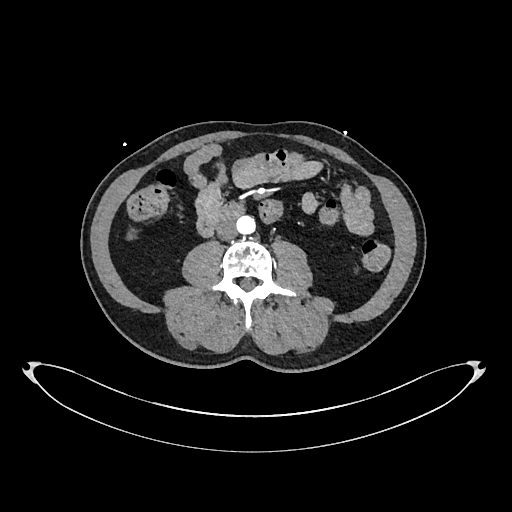
[im 174/331  soft-tissue]
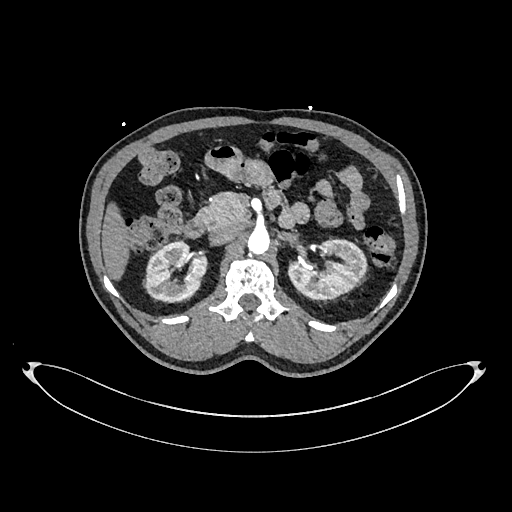
[im 192/331  soft-tissue]
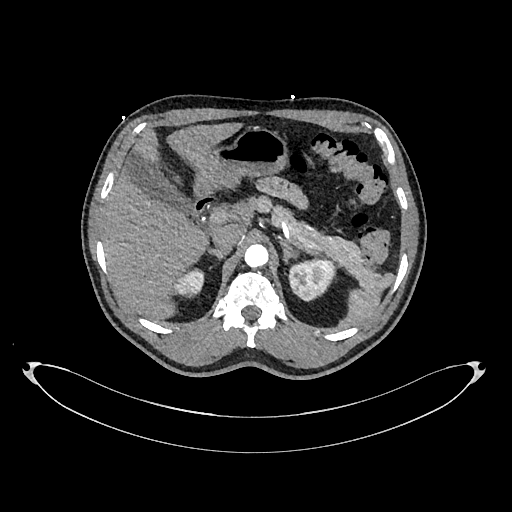
[im 226/331  soft-tissue]
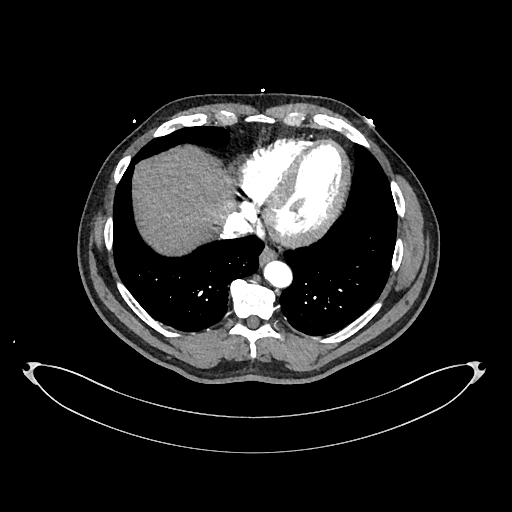
[im 244/331  soft-tissue]
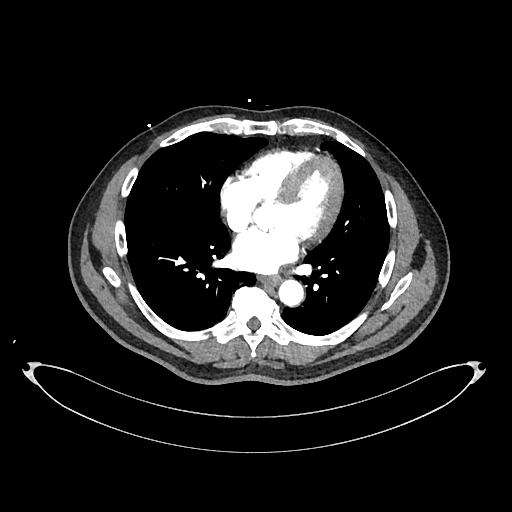
[im 244/331  bone]
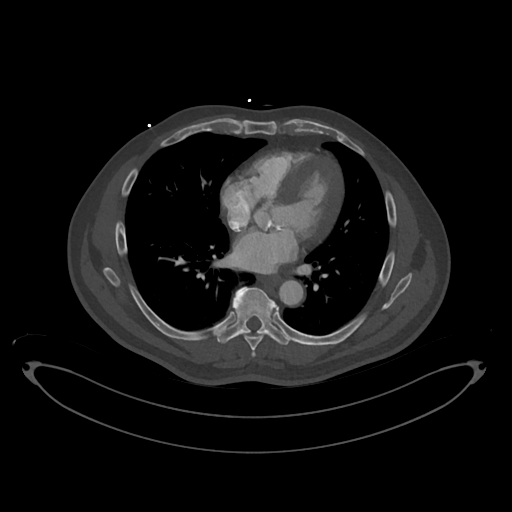
[im 278/331  soft-tissue]
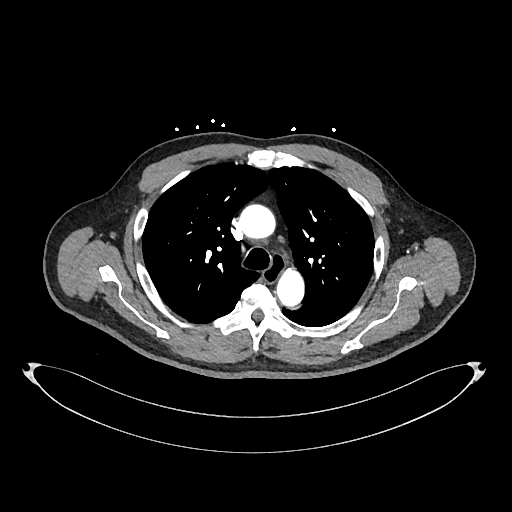
[im 313/331  soft-tissue]
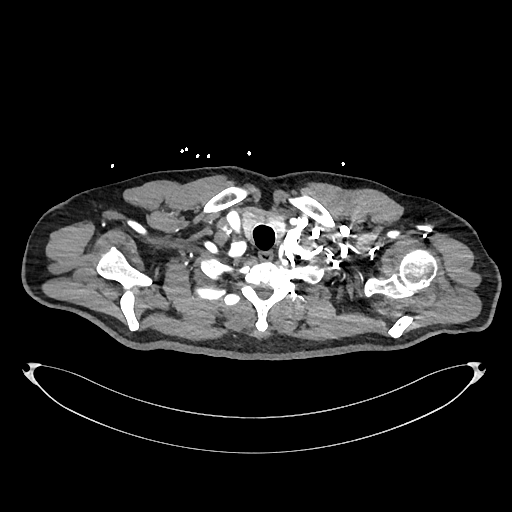

[Series 10: cor · coronal · 0.97mm/px · 3 of 146 slices shown]
[im 37/146  soft-tissue]
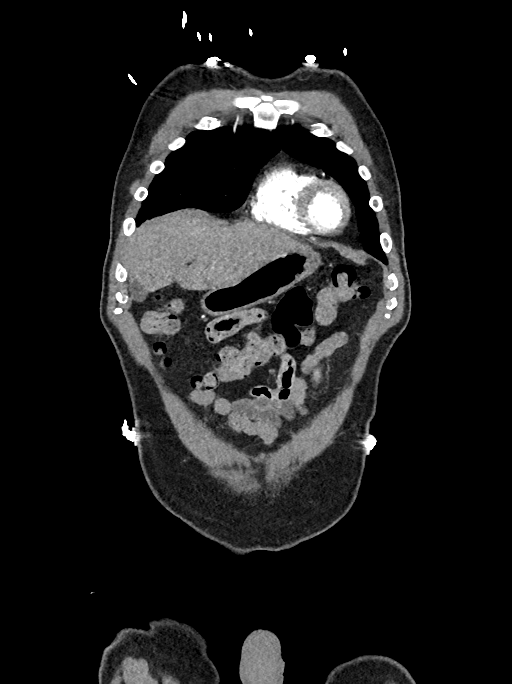
[im 73/146  soft-tissue]
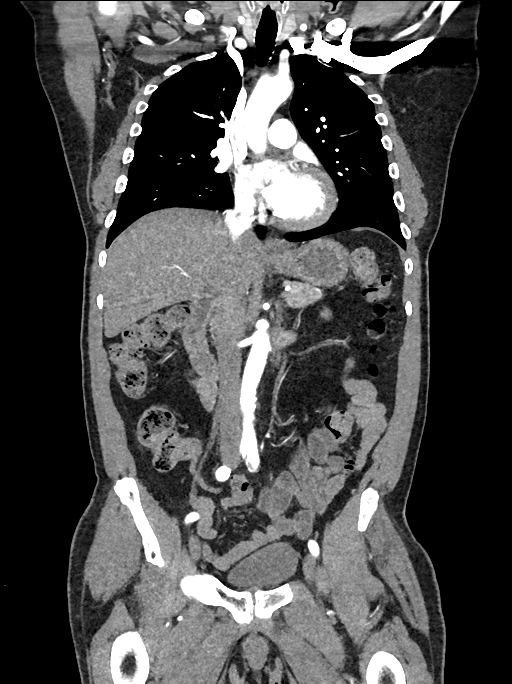
[im 109/146  soft-tissue]
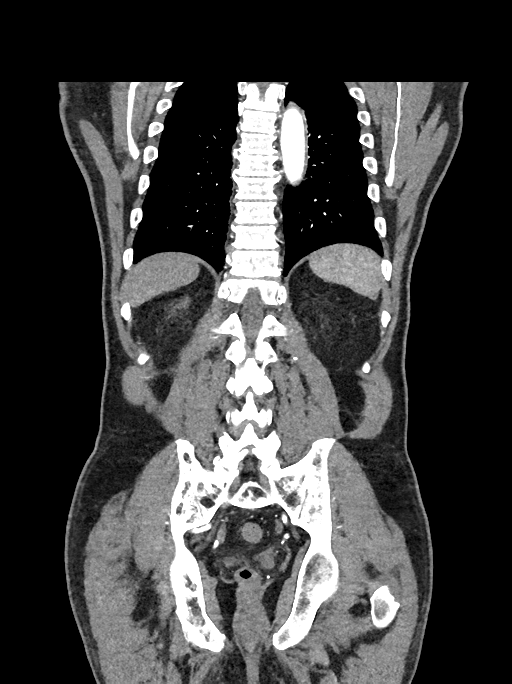

[14 of 46 positions shown; findings below may reference images not displayed]

Multidetector CT imaging through the chest, abdomen and pelvis was
performed using the standard protocol during bolus administration of
intravenous contrast. Multiplanar reconstructed images and MIPs were
obtained and reviewed to evaluate the vascular anatomy.

CONTRAST:  100mL OMNIPAQUE IOHEXOL 350 MG/ML SOLN
FINDINGS: CTA CHEST FINDINGS

Cardiovascular: Initial precontrast images demonstrate no hyperdense
crescent to suggest aortic injury.

Atherosclerotic calcifications of the thoracic aorta are noted
without aneurysmal dilatation. No evidence of dissection or intimal
injury is seen. No cardiac enlargement is noted. The pulmonary
artery shows a normal branching pattern. No filling defects to
suggest pulmonary emboli are identified. Heavy coronary
calcifications are noted as well.

Mediastinum/Nodes: The thoracic inlet is within normal limits. The
esophagus is unremarkable. No sizable hilar or mediastinal
adenopathy is noted.

Lungs/Pleura: Lungs are well aerated bilaterally. No focal
infiltrate or sizable effusion is seen.

Musculoskeletal: Degenerative changes of the thoracic spine are
noted. No acute bony abnormality is noted.

Review of the MIP images confirms the above findings.

CTA ABDOMEN AND PELVIS FINDINGS

VASCULAR

Aorta: Abdominal aorta demonstrates atherosclerotic calcifications
without aneurysmal dilatation or dissection.

Celiac: Patent without evidence of aneurysm, dissection, vasculitis
or significant stenosis.

SMA: Patent without evidence of aneurysm, dissection, vasculitis or
significant stenosis.

Renals: Both renal arteries are patent without evidence of aneurysm,
dissection, vasculitis, fibromuscular dysplasia or significant
stenosis.

IMA: Patent without evidence of aneurysm, dissection, vasculitis or
significant stenosis.

Iliacs: Iliacs demonstrate mild atherosclerotic calcification
without aneurysmal dilatation or narrowing.

Veins: No specific venous abnormality is noted.

Review of the MIP images confirms the above findings.

NON-VASCULAR

Hepatobiliary: No focal liver abnormality is seen. No gallstones,
gallbladder wall thickening, or biliary dilatation.

Pancreas: Unremarkable. No pancreatic ductal dilatation or
surrounding inflammatory changes.

Spleen: Normal in size without focal abnormality.

Adrenals/Urinary Tract: Adrenal glands are within normal limits
bilaterally. Kidneys demonstrate a normal enhancement pattern
bilaterally. No renal calculi or obstructive changes are seen. The
ureters are within normal limits. The bladder is partially
distended.

Stomach/Bowel: Appendix is within normal limits. No obstructive or
inflammatory changes of the colon are seen. Small bowel is within
normal limits. Stomach is unremarkable.

Lymphatic: No specific lymphadenopathy is seen.

Reproductive: Prostate is unremarkable.

Other: No abdominal wall hernia or abnormality. No abdominopelvic
ascites.

Musculoskeletal: Increased trabeculation is noted within the pelvis
particularly on the right but to a lesser degree in the sacrum and
left iliac bone stable in appearance from the prior exam. This is
again most consistent with Paget's disease. No definitive lytic or
sclerotic lesions to suggest metastatic disease are seen.

Review of the MIP images confirms the above findings.
IMPRESSION: No evidence of aortic aneurysm or dissection.

No pulmonary emboli are seen.

Bony changes stable from the prior exam likely representing Paget's
disease.

Aortic Atherosclerosis (ATROA-AMH.H).

## 2021-11-12 ENCOUNTER — Telehealth (HOSPITAL_COMMUNITY): Payer: Self-pay | Admitting: Cardiology

## 2021-11-12 ENCOUNTER — Encounter (HOSPITAL_COMMUNITY): Payer: Self-pay

## 2021-11-12 ENCOUNTER — Ambulatory Visit (HOSPITAL_COMMUNITY)
Admit: 2021-11-12 | Discharge: 2021-11-12 | Disposition: A | Discharge status: Home / Self Care | Payer: Medicare HMO | Source: Ambulatory Visit | Attending: Adult Health | Admitting: Adult Health

## 2021-11-12 VITALS — BP 130/90 | HR 72 | Ht 67.0 in | Wt 178.0 lb

## 2021-11-12 DIAGNOSIS — K08109 Complete loss of teeth, unspecified cause, unspecified class: Secondary | ICD-10-CM | POA: Insufficient documentation

## 2021-11-12 DIAGNOSIS — M270 Developmental disorders of jaws: Secondary | ICD-10-CM | POA: Insufficient documentation

## 2021-11-12 DIAGNOSIS — K0850 Unsatisfactory restoration of tooth, unspecified: Secondary | ICD-10-CM | POA: Insufficient documentation

## 2021-11-12 DIAGNOSIS — I2510 Atherosclerotic heart disease of native coronary artery without angina pectoris: Secondary | ICD-10-CM | POA: Insufficient documentation

## 2021-11-12 DIAGNOSIS — Z7902 Long term (current) use of antithrombotics/antiplatelets: Secondary | ICD-10-CM | POA: Insufficient documentation

## 2021-11-12 DIAGNOSIS — K083 Retained dental root: Secondary | ICD-10-CM | POA: Insufficient documentation

## 2021-11-12 DIAGNOSIS — Z79899 Other long term (current) drug therapy: Secondary | ICD-10-CM | POA: Diagnosis not present

## 2021-11-12 DIAGNOSIS — I5022 Chronic systolic (congestive) heart failure: Secondary | ICD-10-CM | POA: Insufficient documentation

## 2021-11-12 DIAGNOSIS — I350 Nonrheumatic aortic (valve) stenosis: Secondary | ICD-10-CM | POA: Diagnosis not present

## 2021-11-12 DIAGNOSIS — I428 Other cardiomyopathies: Secondary | ICD-10-CM | POA: Diagnosis not present

## 2021-11-12 DIAGNOSIS — K029 Dental caries, unspecified: Secondary | ICD-10-CM | POA: Insufficient documentation

## 2021-11-12 DIAGNOSIS — K0530 Chronic periodontitis, unspecified: Secondary | ICD-10-CM | POA: Insufficient documentation

## 2021-11-12 DIAGNOSIS — Z794 Long term (current) use of insulin: Secondary | ICD-10-CM | POA: Diagnosis not present

## 2021-11-12 DIAGNOSIS — M2632 Excessive spacing of fully erupted teeth: Secondary | ICD-10-CM | POA: Insufficient documentation

## 2021-11-12 DIAGNOSIS — E1140 Type 2 diabetes mellitus with diabetic neuropathy, unspecified: Secondary | ICD-10-CM | POA: Diagnosis not present

## 2021-11-12 DIAGNOSIS — K030 Excessive attrition of teeth: Secondary | ICD-10-CM | POA: Insufficient documentation

## 2021-11-12 DIAGNOSIS — E875 Hyperkalemia: Secondary | ICD-10-CM | POA: Diagnosis not present

## 2021-11-12 DIAGNOSIS — F40232 Fear of other medical care: Secondary | ICD-10-CM | POA: Insufficient documentation

## 2021-11-12 DIAGNOSIS — Z7984 Long term (current) use of oral hypoglycemic drugs: Secondary | ICD-10-CM | POA: Diagnosis not present

## 2021-11-12 DIAGNOSIS — I110 Hypertensive heart disease with heart failure: Secondary | ICD-10-CM | POA: Insufficient documentation

## 2021-11-12 DIAGNOSIS — K036 Deposits [accretions] on teeth: Secondary | ICD-10-CM | POA: Insufficient documentation

## 2021-11-12 DIAGNOSIS — K045 Chronic apical periodontitis: Secondary | ICD-10-CM | POA: Insufficient documentation

## 2021-11-12 DIAGNOSIS — Z4689 Encounter for fitting and adjustment of other specified devices: Secondary | ICD-10-CM | POA: Insufficient documentation

## 2021-11-12 LAB — BASIC METABOLIC PANEL
Anion gap: 10 (ref 5–15)
BUN/Creatinine Ratio: 14 (ref 10–24)
BUN: 18 mg/dL (ref 8–27)
BUN: 17 mg/dL (ref 8–23)
CO2: 21 mmol/L (ref 20–29)
CO2: 27 mmol/L (ref 22–32)
Calcium: 9.2 mg/dL (ref 8.6–10.2)
Calcium: 9.6 mg/dL (ref 8.9–10.3)
Chloride: 103 mmol/L (ref 96–106)
Chloride: 104 mmol/L (ref 98–111)
Creatinine, Ser: 1.27 mg/dL (ref 0.76–1.27)
Creatinine, Ser: 1.48 mg/dL — ABNORMAL HIGH (ref 0.61–1.24)
GFR, Estimated: 50 mL/min — ABNORMAL LOW (ref >60)
Glucose, Bld: 142 mg/dL — ABNORMAL HIGH (ref 70–99)
Glucose: 226 mg/dL — ABNORMAL HIGH (ref 70–99)
Potassium: 4.7 mmol/L (ref 3.5–5.2)
Potassium: 5.5 mmol/L — ABNORMAL HIGH (ref 3.5–5.1)
Sodium: 141 mmol/L (ref 134–144)
Sodium: 141 mmol/L (ref 135–145)
eGFR: 60 mL/min/{1.73_m2} (ref >59)

## 2021-11-12 NOTE — Patient Instructions (Signed)
It was great to see you today! No medication changes are needed at this time.  Labs today We will only contact you if something comes back abnormal or we need to make some changes. Otherwise no news is good news!  You have been ordered a PYP Scan.  This is done in the Radiology Department of [facility].  When you come for this test please plan to be there 2-3 hours.  Your physician recommends that you schedule a follow-up appointment in: 8 weeks with Dr Aundra Dubin  Do the following things EVERYDAY: Weigh yourself in the morning before breakfast. Write it down and keep it in a log. Take your medicines as prescribed Eat low salt foods--Limit salt (sodium) to 2000 mg per day.  Stay as active as you can everyday Limit all fluids for the day to less than 2 liters    At the [facility], you and your health needs are our priority. As part of our continuing mission to provide you with exceptional heart care, we have created designated Provider Care Teams. These Care Teams include your primary Cardiologist (physician) and Advanced Practice Providers (APPs- Physician Assistants and Nurse Practitioners) who all work together to provide you with the care you need, when you need it.   You may see any of the following providers on your designated Care Team at your next follow up: Dr Glori Bickers Dr Loralie Champagne Dr. Roxana Hires, NP Lyda Jester, [place] [facility] [place], [place] Forestine Na, NP Audry Riles, PharmD   Please be sure to bring in all your medications bottles to every appointment.

## 2021-11-12 NOTE — Progress Notes (Signed)
HEART & VASCULAR TRANSITION OF CARE CONSULT NOTE     Referring Physician: Dr Cathlean Sauer  Primary Care: Der Newling  Primary Cardiology: Dr Gwenlyn Found   HPI: Referred to clinic by Dr Cathlean Sauer for heart failure consultation.   Caleb [name] is a 71 year old with h/o low flow aortic stenosis, DMII, PAD, carotid disease, and HTN. He does not smoke and rarely drinks alcohol.   Feeling ok until 2 weeks ago. Developed shortness of breath. Says he usually works out 1-2 per day. He was usually on a rowing machine 1-2 hours per day.    Admitted with 10/27/21 with Acute HFrEF.  Diuresed with IV lasix. GDMT started. Cath with mild non obstructive CAD, NICM, and severe low flow/gradient aortic stenosis. Referred to Structural HF/CT surgery for follow up.   Today he returns for HF follow up.Overall feeling fine. He does have some fatigue when walking.  He has some balance issues due to neuropathy. Says he has numbness in his hands that is more noticeable over the last few years.  Denies SOB/PND/Orthopnea. Appetite ok. Using Mrs Deliah Boston and has been eating bananas 2-3 days. No fever or chills. Weight at home has been stable. Taking all medications. Lives alone.  Cardiac Testing  10/2021 LHC nonobstructive CAD  Mild non-obstructive disease in the mid and distal LAD Mild non-obstructive disease in the Circumflex and Ramus intermediate branches The RCA is a large dominant vessel with moderate proximal to mid stenosis that does not appear to be flow limiting.  Non-ischemic cardiomyopathy Severe low flow/low gradient aortic stenosis by echo  Elevated right heart pressures.   Echo 10/2021 EF 20%  RV moderately reduced. AV low flow low gradient aortic valve stenosis Echo 12/2020 EF 50-55% Review of Systems: [y] = yes, '[ ]'$  = no   General: Weight gain '[ ]'$ ; Weight loss '[ ]'$ ; Anorexia '[ ]'$ ; Fatigue [ Y]; Fever '[ ]'$ ; Chills '[ ]'$ ; Weakness '[ ]'$   Cardiac: Chest pain/pressure '[ ]'$ ; Resting SOB '[ ]'$ ; Exertional SOB '[ ]'$ ; Orthopnea '[ ]'$ ;  Pedal Edema '[ ]'$ ; Palpitations '[ ]'$ ; Syncope '[ ]'$ ; Presyncope '[ ]'$ ; Paroxysmal nocturnal dyspnea'[ ]'$   Pulmonary: Cough '[ ]'$ ; Wheezing'[ ]'$ ; Hemoptysis'[ ]'$ ; Sputum '[ ]'$ ; Snoring '[ ]'$   GI: Vomiting'[ ]'$ ; Dysphagia'[ ]'$ ; Melena'[ ]'$ ; Hematochezia '[ ]'$ ; Heartburn'[ ]'$ ; Abdominal pain '[ ]'$ ; Constipation '[ ]'$ ; Diarrhea '[ ]'$ ; BRBPR '[ ]'$   GU: Hematuria'[ ]'$ ; Dysuria '[ ]'$ ; Nocturia'[ ]'$   Vascular: Pain in legs with walking '[ ]'$ ; Pain in feet with lying flat '[ ]'$ ; Non-healing sores '[ ]'$ ; Stroke '[ ]'$ ; TIA '[ ]'$ ; Slurred speech '[ ]'$ ;  Neuro: Headaches'[ ]'$ ; Vertigo'[ ]'$ ; Seizures'[ ]'$ ; Paresthesias'[ ]'$ ;Blurred vision '[ ]'$ ; Diplopia '[ ]'$ ; Vision changes '[ ]'$   Ortho/Skin: Arthritis '[ ]'$ ; Joint pain '[ ]'$ ; Muscle pain '[ ]'$ ; Joint swelling '[ ]'$ ; Back Pain '[ ]'$ ; Rash '[ ]'$   Psych: Depression'[ ]'$ ; Anxiety'[ ]'$   Heme: Bleeding problems '[ ]'$ ; Clotting disorders '[ ]'$ ; Anemia '[ ]'$   Endocrine: Diabetes [Y ]; Thyroid dysfunction'[ ]'$    Past Medical History:  Diagnosis Date   Aortic stenosis    Carotid artery occlusion    Diabetes mellitus without complication ([place])    Dyslipidemia 09/27/2012   Erectile dysfunction 09/27/2012   Heart murmur    Hypercholesteremia    Hyperlipidemia 08/12/2012   Hypertension    Hyponatremia 08/14/2012   Neuropathy    Pancreatitis    Pancreatitis, acute 09/27/2012   Peripheral vascular disease ([place])  Vitamin D deficiency 06/23/2015    Current Outpatient Medications  Medication Sig Dispense Refill   aspirin EC 81 MG tablet Take 81 mg by mouth daily.     atorvastatin (LIPITOR) 40 MG tablet Take 40 mg by mouth daily.     carvedilol (COREG) 12.5 MG tablet Take 1 tablet (12.5 mg total) by mouth 2 (two) times daily with a meal. 60 tablet 0   clopidogrel (PLAVIX) 75 MG tablet Take 1 tablet (75 mg total) by mouth daily. 90 tablet 1   Empagliflozin-metFORMIN HCl ER (SYNJARDY XR) 25-1000 MG TB24 Take 1 tablet by mouth daily.     ferrous sulfate 325 (65 FE) MG tablet Take 325 mg by mouth daily.     furosemide (LASIX) 40 MG tablet Take 1 tablet (40  mg total) by mouth daily. 30 tablet 0   gabapentin (NEURONTIN) 800 MG tablet Take 800 mg by mouth 2 (two) times daily.     insulin NPH-regular Human (NOVOLIN 70/30) (70-30) 100 UNIT/ML injection Inject 12 Units into the skin 2 (two) times daily with a meal. (Patient taking differently: Inject 6 Units into the skin 2 (two) times daily with a meal.) 20 mL 6   PRESCRIPTION MEDICATION Inject 0.1-1 mLs as directed See admin instructions. Tri-Mix Standard Strength 5 ml. Formula: Prostaglandin 10 mcg/ml, Papaverine 30 mg/ml, Phentolamine 1 mg/ml. Inject 0.1 ml into side of penis as directed as needed (to be injected immediately before sexual intercourse) may increase the dose by 0.1 ml every 48 hours to achieve an erection. Max dose 1 ml.     sacubitril-valsartan (ENTRESTO) 24-26 MG Take 1 tablet by mouth 2 (two) times daily. 60 tablet 0   vitamin B-12 (CYANOCOBALAMIN) 500 MCG tablet Take 500 mcg by mouth daily.     spironolactone (ALDACTONE) 25 MG tablet Take 0.5 tablets (12.5 mg total) by mouth daily. (Patient not taking: Reported on 11/12/2021) 15 tablet 0   Current Facility-Administered Medications  Medication Dose Route Frequency Provider Last Rate Last Admin   sodium chloride flush (NS) 0.9 % injection 3 mL  3 mL Intravenous Q12H Lorretta Harp, MD       sodium chloride flush (NS) 0.9 % injection 3 mL  3 mL Intravenous Q12H Lorretta Harp, MD        Allergies  Allergen Reactions   Percocet [Oxycodone-Acetaminophen] Nausea And Vomiting      Social History   Socioeconomic History   Marital status: Single    Spouse name: Not on file   Number of children: 3   Years of education: Not on file   Highest education level: High school graduate  Occupational History   Occupation: Caregiver    Comment: Special needs    Comment: retired  Tobacco Use   Smoking status: Never   Smokeless tobacco: Never  Vaping Use   Vaping Use: Never used  Substance and Sexual Activity   Alcohol use: Not  Currently    Comment: rare   Drug use: No   Sexual activity: Not Currently  Other Topics Concern   Not on file  Social History Narrative   Widower.  3 daughters and 3 grandchildren.     Social Determinants of Health   Financial Resource Strain: Low Risk  (10/29/2021)   Overall Financial Resource Strain (CARDIA)    Difficulty of Paying Living Expenses: Not hard at all  Food Insecurity: No Food Insecurity (10/29/2021)   Hunger Vital Sign    Worried About Charity fundraiser in  the Last Year: Never true    [place] in the Last Year: Never true  Transportation Needs: No Transportation Needs (10/29/2021)   PRAPARE - Hydrologist (Medical): No    Lack of Transportation (Non-Medical): No  Physical Activity: Sufficiently Active (12/06/2020)   Exercise Vital Sign    Days of Exercise per Week: 4 days    Minutes of Exercise per Session: 60 min  Stress: No Stress Concern Present (12/06/2020)   [place]    Feeling of Stress : Not at all  Social Connections: Moderately Isolated (12/06/2020)   Social Connection and Isolation Panel [NHANES]    Frequency of Communication with Friends and Family: More than three times a week    Frequency of Social Gatherings with Friends and Family: More than three times a week    Attends Religious Services: More than 4 times per year    Active Member of [employer] or Organizations: No    Attends Archivist Meetings: Never    Marital Status: Widowed  Intimate Partner Violence: Not At Risk (12/06/2020)   Humiliation, Afraid, Rape, and Kick questionnaire    Fear of Current or Ex-Partner: No    Emotionally Abused: No    Physically Abused: No    Sexually Abused: No      Family History  Problem Relation Age of Onset   CAD Father    Hypertension Father    Alcohol abuse Father        Cause of death   Diabetes Mother    Colon polyps Mother    CAD Brother  20       CABG   Colon cancer Neg Hx    Esophageal cancer Neg Hx    Rectal cancer Neg Hx    Stomach cancer Neg Hx     Vitals:   11/12/21 1131  BP: (!) 130/90  Pulse: 72  SpO2: 98%  Weight: 80.7 kg (178 lb)  Height: '5\' 7"'$  (1.702 m)    PHYSICAL EXAM: General:  Well appearing. No respiratory difficulty HEENT: normal Neck: supple. no JVD. Carotids 2+ bilat; no bruits. No lymphadenopathy or thryomegaly appreciated. Cor: PMI nondisplaced. Regular rate & rhythm. No rubs, gallops or murmurs. Lungs: clear Abdomen: soft, nontender, nondistended. No hepatosplenomegaly. No bruits or masses. Good bowel sounds. Extremities: no cyanosis, clubbing, rash, edema Neuro: alert & oriented x 3, cranial nerves grossly intact. moves all 4 extremities w/o difficulty. Affect pleasant.  ECG: SR 72 bpm PR 178 ms personally checked.    ASSESSMENT & PLAN: 1. Chronic HFrEF  Echo EF down to 20% from previous 50-55% .  Had Cath with nonobstructive CAD. Considered CMRI but will start with PYP.  Given low flow AS and neuropathy will need myeloma panel + PYP scan.  Concerned he may have amyloid.  NYHA II.  Reds Clip 37%. Volume status stable.   GDMT  Diuretic-Continue lasix 40 mg daily  BB-Cotninue coret 12.5 mg tiwce a day  Ace/ARB/ARNI- Continue entresto at current dose.  MRA- Continue 12.5 mg spiro.  SGLT2i- Continue jardiance see below.  May need to consider Bidil.   2. Aortic Stenosis Low flow Stenosis.  He has CT surgery consult next week with Dr Cyndia Bent.   3. DMII  On combination med--> emplaglifozin/metformin  Needs follow up with PCP.   4. Hyperkalemia  K running high.  Discussed. Asked to avoid high potassium foods and stop using Mrs Deliah Boston.  Check BMET and myeloma panel today.    Referred to HFSW (PCP, Medications, Transportation, ETOH Abuse, Drug Abuse, Insurance, Financial ):  No Refer to Pharmacy:  No Refer to Home Health:  No Refer to Advanced Heart Failure Clinic: Yes  Refer  to General Cardiology: Established with Dr Gwenlyn Found  Follow up with Dr Precious Bard NP-C  2:52 PM

## 2021-11-12 NOTE — Telephone Encounter (Signed)
-----   Message from Conrad Arcola, NP sent at 11/12/2021  1:06 PM EDT ----- Please call. K running high. Stop spiro. Discussed stopping high potassium foods and Mrs Deliah Boston. Set up BMET next week.

## 2021-11-12 NOTE — Telephone Encounter (Signed)
Pt aware Pt will have labs repeated at PCP appt 8/31 Med list updated

## 2021-11-15 NOTE — Progress Notes (Signed)
Pt has been made aware of normal result and verbalized understanding.  jw

## 2021-11-16 ENCOUNTER — Ambulatory Visit (HOSPITAL_COMMUNITY)
Admission: RE | Admit: 2021-11-16 | Discharge: 2021-11-16 | Disposition: A | Discharge status: Home / Self Care | Payer: Medicare HMO | Source: Ambulatory Visit | Attending: Cardiovascular Disease | Admitting: Cardiovascular Disease

## 2021-11-16 ENCOUNTER — Telehealth: Payer: Self-pay | Admitting: Family Medicine

## 2021-11-16 ENCOUNTER — Ambulatory Visit: Payer: Self-pay

## 2021-11-16 DIAGNOSIS — I700 Atherosclerosis of aorta: Secondary | ICD-10-CM | POA: Diagnosis not present

## 2021-11-16 DIAGNOSIS — R911 Solitary pulmonary nodule: Secondary | ICD-10-CM | POA: Diagnosis not present

## 2021-11-16 DIAGNOSIS — I350 Nonrheumatic aortic (valve) stenosis: Secondary | ICD-10-CM | POA: Diagnosis not present

## 2021-11-16 MED ORDER — IOHEXOL 350 MG/ML SOLN
100.0000 mL | Freq: Once | INTRAVENOUS | Status: AC | PRN
  Administered 2021-11-16: 100 mL via INTRAVENOUS

## 2021-11-16 MED ORDER — NITROGLYCERIN 0.4 MG SL SUBL: 0.8000 mg | SUBLINGUAL_TABLET | Freq: Once | SUBLINGUAL | Status: DC

## 2021-11-16 NOTE — Telephone Encounter (Signed)
Copied from [place] [id number]. Topic: General - Inquiry >> Nov 16, 2021 12:31 PM Marcellus Scott wrote: Reason for CRM:Pt returning missed call advice that he has an upcoming appointment with Dr. Margarita Rana on 11/18/21. Pt stated he receives his diabetes supplies from [employer]. His original unit is upgrading to [employer] simply because it is smaller. Needs something from Dr.Newlin confirming that pt can upgrade. Is requesting a handicapped sticker as well.  Pt will discuss with PCP at upcoming appointment just wanted me to send a message.

## 2021-11-16 NOTE — Telephone Encounter (Signed)
Have noted on his appt to address handicap placard at apt on 8/31

## 2021-11-16 NOTE — Telephone Encounter (Signed)
  Chief Complaint: Medication question Symptoms: Pt forgot to take medication Frequency: a week or so Pertinent Negatives: Patient denies  Disposition: '[]'$ ED /'[]'$ Urgent Care (no appt availability in office) / '[]'$ Appointment(In office/virtual)/ '[]'$  [facility]/ '[]'$ Home Care/ '[]'$ Refused Recommended Disposition /'[]'$ [facility]/ '[x]'$  Follow-up with PCP Additional Notes: PT was prescribed this medication by endocrinologist. Pt forgot to take it for a few days. Pt also states that the dosage was changed, but he does not have the correct dose. Pt will call Endocrinologist regarding this.  Pt would also like get a handicapped card for his car. He was having heart issues and will not be able to get stent until October.    Summary: Med Management   Pt stated he missed diabetic medication that starts with an E '25MG'$  and takes half a tablet a day. Pt stated he isn't sure why he stopped taking it. May have just left it out and just stumbled across it.    Just started taking it again this morning. He may have stopped taking it for about a week otwo and wants to make sure he is okay with taking it.    Pt seeking clinical advice.      Reason for Disposition  [1] Caller has URGENT medicine question about med that PCP or specialist prescribed AND [2] triager unable to answer question  Answer Assessment - Initial Assessment Questions 1. NAME of MEDICINE: "What medicine(s) are you calling about?"     Empagliflozin-metFORMIN HCl ER (SYNJARDY XR) 25-1000 MG TB24 [[id number] 2. QUESTION: "What is your question?" (e.g., double dose of medicine, side effect)     PT forgot to take a few doses 3. PRESCRIBER: "Who prescribed the medicine?" Reason: if prescribed by specialist, call should be referred to that group.     Endocrinologist 4. SYMPTOMS: "Do you have any symptoms?" If Yes, ask: "What symptoms are you having?"  "How bad are the symptoms (e.g., mild, moderate, severe)     no 5. PREGNANCY:   "Is there any chance that you are pregnant?" "When was your last menstrual period?"     na  Protocols used: Medication Question Call-A-AH

## 2021-11-16 NOTE — Telephone Encounter (Signed)
Noted  

## 2021-11-17 ENCOUNTER — Other Ambulatory Visit: Payer: Self-pay

## 2021-11-17 LAB — MULTIPLE MYELOMA PANEL, SERUM
Albumin SerPl Elph-Mcnc: 3.9 g/dL (ref 2.9–4.4)
Albumin/Glob SerPl: 1.1 (ref 0.7–1.7)
Alpha 1: 0.2 g/dL (ref 0.0–0.4)
Alpha2 Glob SerPl Elph-Mcnc: 0.7 g/dL (ref 0.4–1.0)
B-Globulin SerPl Elph-Mcnc: 1.2 g/dL (ref 0.7–1.3)
Gamma Glob SerPl Elph-Mcnc: 1.5 g/dL (ref 0.4–1.8)
Globulin, Total: 3.6 g/dL (ref 2.2–3.9)
IgA: 407 mg/dL (ref 61–437)
IgG (Immunoglobin G), Serum: 1705 mg/dL — ABNORMAL HIGH (ref 603–1613)
IgM (Immunoglobulin M), Srm: 77 mg/dL (ref 15–143)
Total Protein ELP: 7.5 g/dL (ref 6.0–8.5)

## 2021-11-17 NOTE — Patient Outreach (Signed)
[facility]) Care Management  11/17/2021  [name] 10-18-1950 [id number]   Case Closure   Case is being transferred to [place] services. Assigned RN CM will outreach and follow up with patient.      Caleb Montgomery, RN,BSN,CCM [place] [facility] Telephonic Care Management Coordinator Direct Phone: [phone] Toll Free: [phone] Fax: [phone]

## 2021-11-18 ENCOUNTER — Encounter: Payer: Self-pay | Admitting: Family Medicine

## 2021-11-18 ENCOUNTER — Ambulatory Visit: Payer: Medicare HMO | Attending: Family Medicine | Admitting: Family Medicine

## 2021-11-18 ENCOUNTER — Institutional Professional Consult (permissible substitution): Payer: Medicare HMO | Admitting: Surgery

## 2021-11-18 VITALS — BP 158/89 | HR 78 | Temp 98.5°F | Ht 67.0 in | Wt 185.4 lb

## 2021-11-18 DIAGNOSIS — E1151 Type 2 diabetes mellitus with diabetic peripheral angiopathy without gangrene: Secondary | ICD-10-CM | POA: Diagnosis not present

## 2021-11-18 DIAGNOSIS — I350 Nonrheumatic aortic (valve) stenosis: Secondary | ICD-10-CM

## 2021-11-18 DIAGNOSIS — I5042 Chronic combined systolic (congestive) and diastolic (congestive) heart failure: Secondary | ICD-10-CM | POA: Diagnosis not present

## 2021-11-18 DIAGNOSIS — E1159 Type 2 diabetes mellitus with other circulatory complications: Secondary | ICD-10-CM | POA: Diagnosis not present

## 2021-11-18 DIAGNOSIS — I152 Hypertension secondary to endocrine disorders: Secondary | ICD-10-CM | POA: Diagnosis not present

## 2021-11-18 DIAGNOSIS — Z794 Long term (current) use of insulin: Secondary | ICD-10-CM

## 2021-11-18 DIAGNOSIS — E875 Hyperkalemia: Secondary | ICD-10-CM

## 2021-11-18 DIAGNOSIS — I110 Hypertensive heart disease with heart failure: Secondary | ICD-10-CM

## 2021-11-18 MED ORDER — ISOSORB DINITRATE-HYDRALAZINE 20-37.5 MG PO TABS: 1.0000 | ORAL_TABLET | Freq: Three times a day (TID) | ORAL | 2 refills | Status: DC

## 2021-11-18 NOTE — Progress Notes (Signed)
Subjective:  Patient ID: Caleb [name], male    DOB: 08/29/1950  Age: 71 y.o. MRN: [id number]  CC: Hypertension   HPI Caleb [name] is a 71 y.o. year old male with a history of type 2 diabetes mellitus (A1c 8.0 in 05/2021), hypertension, hyperlipidemia, s/p L 4th and 5th  toe metatarsal amputation, PAD status post b/l popliteal artery intervention, bilateral carotid artery stenosis status post bilateral  transcarotid artery revascularization (in 11/2019 and 01/2020), HFrEF (EF 20%, global hypokinesis, grade 2 DD, AVR aortic valve stenosis from 10/2021 down from 50-55% previously) , b/l carotid stenosis (status post TCAR) who presents today for a follow-up visit.   He was hospitalized 3 weeks ago for acute CHF exacerbation after he had presented with dyspnea.   Chest x-ray revealed: IMPRESSION: Mild cardiomegaly. There is perihilar stranding and perihilar cuffing seen of likely bronchitis. Superimposed infiltrations cannot be excluded.   Echocardiogram revealed EF of 20%, severely decreased LV function, LV global hypokinesis, grade 2 DD, moderately reduced RV systolic function, mildly dilated LA, severe aortic valve calcification, moderate AVR moderate AV stenosis, dilated IVC.   RHC/LHC revealed: Conclusion      Prox RCA lesion is 60% stenosed.   Ramus lesion is 40% stenosed.   2nd Mrg lesion is 60% stenosed.   Mid LAD lesion is 30% stenosed.   Dist LAD lesion is 40% stenosed.   Mild non-obstructive disease in the mid and distal LAD Mild non-obstructive disease in the Circumflex and Ramus intermediate branches The RCA is a large dominant vessel with moderate proximal to mid stenosis that does not appear to be flow limiting.  Non-ischemic cardiomyopathy Severe low flow/low gradient aortic stenosis by echo  Elevated right heart pressures.    He was diuresed, metoprolol changed to carvedilol with recommendations to follow-up outpatient for TAVR.  Interval History: His  follow-up with cardiothoracic surgery comes up in a month. He has had a follow up appointment with Cardiology and coronary CT ordered which revealed: IMPRESSION: 1. Severe aortic stenosis. Findings pertinent to TAVR procedure are detailed above.   2. Patient's total coronary artery calcium score is 2037, which is 98th percentile for subjects of the same age, gender, and race based populations.     His BP is elevated and he endorses adherence with his antihypertensive. He states he feels fine but [place] his legs gave out on him hence he would like a handicap placard. Blood sugars in the fasting range or 111-125, random up to 150. Sometimes has spikes of 250 when he eats what he is not supposed to eat.  Diabetes is managed by his endocrinologist Dr. Lindwood Coke. He is trying to upgrade his freestyle CGM. Past Medical History:  Diagnosis Date   Aortic stenosis    Carotid artery occlusion    Diabetes mellitus without complication ([place])    Dyslipidemia 09/27/2012   Erectile dysfunction 09/27/2012   Heart murmur    Hypercholesteremia    Hyperlipidemia 08/12/2012   Hypertension    Hyponatremia 08/14/2012   Neuropathy    Pancreatitis    Pancreatitis, acute 09/27/2012   Peripheral vascular disease ([place])    Vitamin D deficiency 06/23/2015    Past Surgical History:  Procedure Laterality Date   ABDOMINAL AORTOGRAM W/LOWER EXTREMITY N/A 08/08/2019   Procedure: ABDOMINAL AORTOGRAM W/LOWER EXTREMITY;  Surgeon: Lorretta Harp, MD;  Location: [facility];  Service: Cardiovascular;  Laterality: N/A;   ABDOMINAL AORTOGRAM W/LOWER EXTREMITY N/A 11/18/2019  Procedure: ABDOMINAL AORTOGRAM W/LOWER EXTREMITY;  Surgeon: Lorretta Harp, MD;  Location: [facility];  Service: Cardiovascular;  Laterality: N/A;   COLONOSCOPY  12 years ago   in [place] clinic= normal exam per pt   KNEE SURGERY     PERIPHERAL VASCULAR INTERVENTION Right 08/08/2019   Procedure: PERIPHERAL  VASCULAR INTERVENTION;  Surgeon: Lorretta Harp, MD;  Location: [facility];  Service: Cardiovascular;  Laterality: Right;   PERIPHERAL VASCULAR INTERVENTION Left 11/18/2019   Procedure: PERIPHERAL VASCULAR INTERVENTION;  Surgeon: Lorretta Harp, MD;  Location: [facility];  Service: Cardiovascular;  Laterality: Left;  left popliteal artery   RIGHT/LEFT HEART CATH AND CORONARY ANGIOGRAPHY N/A 10/29/2021   Procedure: RIGHT/LEFT HEART CATH AND CORONARY ANGIOGRAPHY;  Surgeon: Burnell Blanks, MD;  Location: [facility];  Service: Cardiovascular;  Laterality: N/A;   TRANSCAROTID ARTERY REVASCULARIZATION  Left 12/16/2019   Procedure: TRANSCAROTID ARTERY REVASCULARIZATION;  Surgeon: Elam Dutch, MD;  Location: [facility] OR;  Service: Vascular;  Laterality: Left;   TRANSCAROTID ARTERY REVASCULARIZATION  Right 02/03/2020   Procedure: RIGHT TRANSCAROTID ARTERY REVASCULARIZATION;  Surgeon: Elam Dutch, MD;  Location: [facility] OR;  Service: Vascular;  Laterality: Right;   ULTRASOUND GUIDANCE FOR VASCULAR ACCESS Right 12/16/2019   Procedure: ULTRASOUND GUIDANCE FOR VASCULAR ACCESS;  Surgeon: Elam Dutch, MD;  Location: [facility] OR;  Service: Vascular;  Laterality: Right;   ULTRASOUND GUIDANCE FOR VASCULAR ACCESS Right 02/03/2020   Procedure: ULTRASOUND GUIDANCE FOR VASCULAR ACCESS;  Surgeon: Elam Dutch, MD;  Location: [facility] OR;  Service: Vascular;  Laterality: Right;   VASECTOMY      Family History  Problem Relation Age of Onset   CAD Father    Hypertension Father    Alcohol abuse Father        Cause of death   Diabetes Mother    Colon polyps Mother    CAD Brother 81       CABG   Colon cancer Neg Hx    Esophageal cancer Neg Hx    Rectal cancer Neg Hx    Stomach cancer Neg Hx     Social History   Socioeconomic History   Marital status: Single    Spouse name: Not on file   Number of children: 3   Years of education: Not on file   Highest education  level: High school graduate  Occupational History   Occupation: Caregiver    Comment: Special needs    Comment: retired  Tobacco Use   Smoking status: Never   Smokeless tobacco: Never  Vaping Use   Vaping Use: Never used  Substance and Sexual Activity   Alcohol use: Not Currently    Comment: rare   Drug use: No   Sexual activity: Not Currently  Other Topics Concern   Not on file  Social History Narrative   Widower.  3 daughters and 3 grandchildren.     Social Determinants of Health   Financial Resource Strain: Low Risk  (10/29/2021)   Overall Financial Resource Strain (CARDIA)    Difficulty of Paying Living Expenses: Not hard at all  Food Insecurity: No Food Insecurity (10/29/2021)   Hunger Vital Sign    Worried About Running Out of Food in the Last Year: Never true    Ran Out of Food in the Last Year: Never true  Transportation Needs: No Transportation Needs (10/29/2021)   PRAPARE - Hydrologist (Medical): No  Lack of Transportation (Non-Medical): No  Physical Activity: Sufficiently Active (12/06/2020)   Exercise Vital Sign    Days of Exercise per Week: 4 days    Minutes of Exercise per Session: 60 min  Stress: No Stress Concern Present (12/06/2020)   [place]    Feeling of Stress : Not at all  Social Connections: Moderately Isolated (12/06/2020)   Social Connection and Isolation Panel [NHANES]    Frequency of Communication with Friends and Family: More than three times a week    Frequency of Social Gatherings with Friends and Family: More than three times a week    Attends Religious Services: More than 4 times per year    Active Member of [employer] or Organizations: No    Attends Archivist Meetings: Never    Marital Status: Widowed    Allergies  Allergen Reactions   Percocet [Oxycodone-Acetaminophen] Nausea And Vomiting    Outpatient Medications Prior to Visit   Medication Sig Dispense Refill   aspirin EC 81 MG tablet Take 81 mg by mouth daily.     atorvastatin (LIPITOR) 40 MG tablet Take 40 mg by mouth daily.     carvedilol (COREG) 12.5 MG tablet Take 1 tablet (12.5 mg total) by mouth 2 (two) times daily with a meal. 60 tablet 0   clopidogrel (PLAVIX) 75 MG tablet Take 1 tablet (75 mg total) by mouth daily. 90 tablet 1   Empagliflozin-metFORMIN HCl ER (SYNJARDY XR) 25-1000 MG TB24 Take 1 tablet by mouth daily.     ferrous sulfate 325 (65 FE) MG tablet Take 325 mg by mouth daily.     furosemide (LASIX) 40 MG tablet Take 1 tablet (40 mg total) by mouth daily. 30 tablet 0   gabapentin (NEURONTIN) 800 MG tablet Take 800 mg by mouth 2 (two) times daily.     insulin NPH-regular Human (NOVOLIN 70/30) (70-30) 100 UNIT/ML injection Inject 12 Units into the skin 2 (two) times daily with a meal. (Patient taking differently: Inject 6 Units into the skin 2 (two) times daily with a meal.) 20 mL 6   PRESCRIPTION MEDICATION Inject 0.1-1 mLs as directed See admin instructions. Tri-Mix Standard Strength 5 ml. Formula: Prostaglandin 10 mcg/ml, Papaverine 30 mg/ml, Phentolamine 1 mg/ml. Inject 0.1 ml into side of penis as directed as needed (to be injected immediately before sexual intercourse) may increase the dose by 0.1 ml every 48 hours to achieve an erection. Max dose 1 ml.     sacubitril-valsartan (ENTRESTO) 24-26 MG Take 1 tablet by mouth 2 (two) times daily. 60 tablet 0   vitamin B-12 (CYANOCOBALAMIN) 500 MCG tablet Take 500 mcg by mouth daily.     Facility-Administered Medications Prior to Visit  Medication Dose Route Frequency Provider Last Rate Last Admin   sodium chloride flush (NS) 0.9 % injection 3 mL  3 mL Intravenous Q12H Lorretta Harp, MD       sodium chloride flush (NS) 0.9 % injection 3 mL  3 mL Intravenous Q12H Lorretta Harp, MD         ROS Review of Systems  Constitutional:  Negative for activity change and appetite change.  HENT:   Negative for sinus pressure and sore throat.   Respiratory:  Negative for chest tightness, shortness of breath and wheezing.   Cardiovascular:  Negative for chest pain and palpitations.  Gastrointestinal:  Negative for abdominal distention, abdominal pain and constipation.  Genitourinary: Negative.   Musculoskeletal:  See HPI  Psychiatric/Behavioral:  Negative for behavioral problems and dysphoric mood.     Objective:  BP (!) 158/89   Pulse 78   Temp 98.5 F (36.9 C) (Oral)   Ht '5\' 7"'$  (1.702 m)   Wt 185 lb 6.4 oz (84.1 kg)   SpO2 98%   BMI 29.04 kg/m      11/18/2021   12:09 PM 11/18/2021   11:17 AM 11/16/2021   10:35 AM  BP/Weight  Systolic BP 387 564 332  Diastolic BP 89 78 86  Wt. (Lbs)  185.4   BMI  29.04 kg/m2       Physical Exam Constitutional:      Appearance: He is well-developed.  Cardiovascular:     Rate and Rhythm: Normal rate.     Heart sounds: Murmur heard.  Pulmonary:     Effort: Pulmonary effort is normal.     Breath sounds: Normal breath sounds. No wheezing or rales.  Chest:     Chest wall: No tenderness.  Abdominal:     General: Bowel sounds are normal. There is no distension.     Palpations: Abdomen is soft. There is no mass.     Tenderness: There is no abdominal tenderness.  Musculoskeletal:        General: Normal range of motion.     Right lower leg: No edema.     Left lower leg: No edema.  Neurological:     Mental Status: He is alert and oriented to person, place, and time.  Psychiatric:        Mood and Affect: Mood normal.        Latest Ref Rng & Units 11/12/2021   12:10 PM 11/11/2021   12:04 PM 11/03/2021    9:27 AM  CMP  Glucose 70 - 99 mg/dL 142  226  213   BUN 8 - 23 mg/dL '17  18  21   '$ Creatinine 0.61 - 1.24 mg/dL 1.48  1.27  1.42   Sodium 135 - 145 mmol/L 141  141  140   Potassium 3.5 - 5.1 mmol/L 5.5  4.7  5.3   Chloride 98 - 111 mmol/L 104  103  103   CO2 22 - 32 mmol/L '27  21  24   '$ Calcium 8.9 - 10.3 mg/dL 9.6  9.2   9.3     Lipid Panel     Component Value Date/Time   CHOL 106 06/05/2020 0947   TRIG 138 06/05/2020 0947   HDL 53 06/05/2020 0947   CHOLHDL 2.0 06/05/2020 0947   CHOLHDL 3.9 04/02/2013 1632   VLDL 66 (H) 04/02/2013 1632   LDLCALC 29 06/05/2020 0947    CBC    Component Value Date/Time   WBC 6.0 10/28/2021 0416   RBC 4.72 10/28/2021 0416   HGB 14.3 10/29/2021 1342   HGB 13.3 11/15/2019 1139   HCT 42.0 10/29/2021 1342   HCT 40.2 11/15/2019 1139   PLT 324 10/28/2021 0416   PLT 300 11/15/2019 1139   MCV 85.0 10/28/2021 0416   MCV 86 11/15/2019 1139   MCH 27.3 10/28/2021 0416   MCHC 32.2 10/28/2021 0416   RDW 16.4 (H) 10/28/2021 0416   RDW 14.5 11/15/2019 1139   LYMPHSABS 2.1 10/27/2021 1707   LYMPHSABS 3.3 (H) 09/28/2017 1113   MONOABS 0.6 10/27/2021 1707   EOSABS 0.2 10/27/2021 1707   EOSABS 0.3 09/28/2017 1113   BASOSABS 0.0 10/27/2021 1707   BASOSABS 0.0 09/28/2017 1113    Lab Results  Component Value Date   HGBA1C 8.0 (A) 09/29/2021    Assessment & Plan:  1. Type 2 diabetes mellitus with diabetic peripheral angiopathy without gangrene, with long-term current use of insulin (HCC) Suboptimally controlled with A1c of 8.0; goal is less than 7.0 He does endorse some dietary indiscretion Has upcoming appointment with his endocrinologist Advised to discuss freestyle libre prescription with his endocrinologist Counseled on Diabetic diet, my plate method, 983 minutes of moderate intensity exercise/week Blood sugar logs with fasting goals of 80-120 mg/dl, random of less than 180 and in the event of sugars less than 60 mg/dl or greater than 400 mg/dl encouraged to notify the clinic. Advised on the need for annual eye exams, annual foot exams, Pneumonia vaccine.   2. Hypertension associated with diabetes ([place]) Uncontrolled BiDil added to regimen  3. Hypertensive heart disease with chronic combined systolic and diastolic congestive heart failure (HCC) EF of 20%  significant drop from 50 to 55% 1 year ago Euvolemic Continue GDMT-beta-blocker, Entresto, SGLT2i - isosorbide-hydrALAZINE (BIDIL) 20-37.5 MG tablet; Take 1 tablet by mouth 3 (three) times daily.  Dispense: 90 tablet; Refill: 2  4. Hyperkalemia Last potassium was 5.5 and spironolactone was discontinued by cardiologist - Basic Metabolic Panel  5. Aortic valve stenosis, etiology of cardiac valve disease unspecified Will need to be evaluated for TAVR Keep upcoming appointment with cardiothoracic surgery    Meds ordered this encounter  Medications   isosorbide-hydrALAZINE (BIDIL) 20-37.5 MG tablet    Sig: Take 1 tablet by mouth 3 (three) times daily.    Dispense:  90 tablet    Refill:  2    Follow-up: Return for follow up, keep previously scheduled appointment.       [name], MD, FAAFP. [facility] and [place], [place]   11/18/2021, 1:12 PM

## 2021-11-19 LAB — BASIC METABOLIC PANEL
BUN/Creatinine Ratio: 14 (ref 10–24)
BUN: 17 mg/dL (ref 8–27)
CO2: 21 mmol/L (ref 20–29)
Calcium: 8.8 mg/dL (ref 8.6–10.2)
Chloride: 107 mmol/L — ABNORMAL HIGH (ref 96–106)
Creatinine, Ser: 1.25 mg/dL (ref 0.76–1.27)
Glucose: 165 mg/dL — ABNORMAL HIGH (ref 70–99)
Potassium: 4 mmol/L (ref 3.5–5.2)
Sodium: 144 mmol/L (ref 134–144)
eGFR: 62 mL/min/{1.73_m2} (ref >59)

## 2021-12-02 ENCOUNTER — Telehealth (HOSPITAL_COMMUNITY): Payer: Self-pay | Admitting: Vascular Surgery

## 2021-12-02 NOTE — Telephone Encounter (Signed)
Lvm giving pt PYP scan appt 9/28 '@10'$ 

## 2021-12-06 ENCOUNTER — Encounter: Payer: Self-pay | Admitting: Surgery

## 2021-12-06 ENCOUNTER — Institutional Professional Consult (permissible substitution) (INDEPENDENT_AMBULATORY_CARE_PROVIDER_SITE_OTHER): Payer: Medicare HMO | Admitting: Surgery

## 2021-12-06 VITALS — BP 150/91 | HR 77 | Resp 18 | Ht 67.0 in | Wt 185.0 lb

## 2021-12-06 DIAGNOSIS — I350 Nonrheumatic aortic (valve) stenosis: Secondary | ICD-10-CM | POA: Diagnosis not present

## 2021-12-06 NOTE — Progress Notes (Signed)
Patient ID: Caleb [name], male   DOB: [birth year], 71 y.o.   MRN: [id number]  [facility]VE CLINIC       [address]       [place] [address]             [phone]          CARDIOTHORACIC SURGERY CONSULTATION REPORT  PCP is [name], MD Referring Provider is [name], MD Primary Cardiologist is Quay Burow, MD  Reason for consultation:  Severe aortic stenosis  HPI:  The patient is a 71 year old gentleman with history of diabetes with peripheral neuropathy, hypertension, hyperlipidemia, carotid artery disease status post bilateral TCAR, PAD status post previous bilateral popliteal artery stenting, stage III chronic kidney disease, nonischemic cardiomyopathy with chronic combined systolic and diastolic congestive heart failure with an LVEF of 20% who was recently admitted for acute systolic heart failure.  The patient is quite active exercising multiple days per week at the gym, walking at least a mile on a treadmill and doing a rowing machine for up to an hour as well as lifting weights.  He reports feeling well with no symptoms until he recently drove down to [place] to take his grandson to [employer] school.  He drove in a car for approximately 20 hours and felt fine until he got out of the car at home and developed sudden severe shortness of breath.  He laid down on his couch and fell asleep until the next morning when he went to the [place] and was told to come to [facility] for admission.  A 2D echocardiogram showed severe calcification of the aortic valve with a mean gradient of 12.3 mmHg and a stroke-volume index of 19 with an ejection fraction of 20%.  There was mild to moderate aortic insufficiency.  Right ventricular systolic function was moderately reduced.  There was trivial mitral regurgitation.  His prior echo in 12/2020 showed an ejection fraction of 50 to 55% with normal RV function.  The mean  gradient across the aortic valve at that time was 17 mmHg.  Cardiac catheterization on 10/29/2021 showed mild nonobstructive disease in the mid and distal LAD.  There was mild nonobstructive disease in left circumflex and ramus branches.  The RCA was a large dominant vessel with moderate proximal to mid stenosis that did not appear to be flow-limiting.  Right heart pressures were elevated with a PA pressure of 50/29 and a mean of 38.  Mean right atrial pressure was 11.  Since discharge on Lasix he has been feeling fairly well.  He has gone back to the gym and is walking on a treadmill but not using a rowing machine.  He tried walking at [employer] a couple weeks ago but after 15 minutes walking hills he had marked weakness in his legs and it took him 45 minutes to get back to his car.  He has been lifting some weights.  He denies any chest pain or pressure.  He has some exertional shortness of breath but is mild.  He denies any dizziness or syncope.  He has had no orthopnea or peripheral edema.  He is here today by himself.  He is widowed and lives alone.  He has 3 daughters who live out of town, 1 of whom is a Lexicographer.  Past Medical History:  Diagnosis Date   Aortic stenosis    Carotid artery occlusion    Diabetes mellitus without complication ([place])  Dyslipidemia 09/27/2012   Erectile dysfunction 09/27/2012   Heart murmur    Hypercholesteremia    Hyperlipidemia 08/12/2012   Hypertension    Hyponatremia 08/14/2012   Neuropathy    Pancreatitis    Pancreatitis, acute 09/27/2012   Peripheral vascular disease ([place])    Vitamin D deficiency 06/23/2015    Past Surgical History:  Procedure Laterality Date   ABDOMINAL AORTOGRAM W/LOWER EXTREMITY N/A 08/08/2019   Procedure: ABDOMINAL AORTOGRAM W/LOWER EXTREMITY;  Surgeon: Lorretta Harp, MD;  Location: [place] [facility];  Service: Cardiovascular;  Laterality: N/A;   ABDOMINAL AORTOGRAM W/LOWER EXTREMITY N/A 11/18/2019   Procedure:  ABDOMINAL AORTOGRAM W/LOWER EXTREMITY;  Surgeon: Lorretta Harp, MD;  Location: [place] [facility];  Service: Cardiovascular;  Laterality: N/A;   COLONOSCOPY  12 years ago   in [place] clinic= normal exam per pt   KNEE SURGERY     PERIPHERAL VASCULAR INTERVENTION Right 08/08/2019   Procedure: PERIPHERAL VASCULAR INTERVENTION;  Surgeon: Lorretta Harp, MD;  Location: [place] [facility];  Service: Cardiovascular;  Laterality: Right;   PERIPHERAL VASCULAR INTERVENTION Left 11/18/2019   Procedure: PERIPHERAL VASCULAR INTERVENTION;  Surgeon: Lorretta Harp, MD;  Location: [place] [facility];  Service: Cardiovascular;  Laterality: Left;  left popliteal artery   RIGHT/LEFT HEART CATH AND CORONARY ANGIOGRAPHY N/A 10/29/2021   Procedure: RIGHT/LEFT HEART CATH AND CORONARY ANGIOGRAPHY;  Surgeon: Burnell Blanks, MD;  Location: [place] [facility];  Service: Cardiovascular;  Laterality: N/A;   TRANSCAROTID ARTERY REVASCULARIZATION  Left 12/16/2019   Procedure: TRANSCAROTID ARTERY REVASCULARIZATION;  Surgeon: Elam Dutch, MD;  Location: [facility] OR;  Service: Vascular;  Laterality: Left;   TRANSCAROTID ARTERY REVASCULARIZATION  Right 02/03/2020   Procedure: RIGHT TRANSCAROTID ARTERY REVASCULARIZATION;  Surgeon: Elam Dutch, MD;  Location: [facility] OR;  Service: Vascular;  Laterality: Right;   ULTRASOUND GUIDANCE FOR VASCULAR ACCESS Right 12/16/2019   Procedure: ULTRASOUND GUIDANCE FOR VASCULAR ACCESS;  Surgeon: Elam Dutch, MD;  Location: [facility] OR;  Service: Vascular;  Laterality: Right;   ULTRASOUND GUIDANCE FOR VASCULAR ACCESS Right 02/03/2020   Procedure: ULTRASOUND GUIDANCE FOR VASCULAR ACCESS;  Surgeon: Elam Dutch, MD;  Location: [facility] OR;  Service: Vascular;  Laterality: Right;   VASECTOMY      Family History  Problem Relation Age of Onset   CAD Father    Hypertension Father    Alcohol abuse Father        Cause of death   Diabetes Mother    Colon polyps Mother    CAD Brother  42       CABG   Colon cancer Neg Hx    Esophageal cancer Neg Hx    Rectal cancer Neg Hx    Stomach cancer Neg Hx     Social History   Socioeconomic History   Marital status: Single    Spouse name: Not on file   Number of children: 3   Years of education: Not on file   Highest education level: High school graduate  Occupational History   Occupation: Caregiver    Comment: Special needs    Comment: retired  Tobacco Use   Smoking status: Never   Smokeless tobacco: Never  Vaping Use   Vaping Use: Never used  Substance and Sexual Activity   Alcohol use: Not Currently    Comment: rare   Drug use: No   Sexual activity: Not Currently  Other Topics Concern   Not on file  Social History Narrative  Widower.  3 daughters and 3 grandchildren.     Social Determinants of Health   Financial Resource Strain: Low Risk  (10/29/2021)   Overall Financial Resource Strain (CARDIA)    Difficulty of Paying Living Expenses: Not hard at all  Food Insecurity: No Food Insecurity (10/29/2021)   Hunger Vital Sign    Worried About Running Out of Food in the Last Year: Never true    Ran Out of Food in the Last Year: Never true  Transportation Needs: No Transportation Needs (10/29/2021)   PRAPARE - Hydrologist (Medical): No    Lack of Transportation (Non-Medical): No  Physical Activity: Sufficiently Active (12/06/2020)   Exercise Vital Sign    Days of Exercise per Week: 4 days    Minutes of Exercise per Session: 60 min  Stress: No Stress Concern Present (12/06/2020)   [place]    Feeling of Stress : Not at all  Social Connections: Moderately Isolated (12/06/2020)   Social Connection and Isolation Panel [NHANES]    Frequency of Communication with Friends and Family: More than three times a week    Frequency of Social Gatherings with Friends and Family: More than three times a week    Attends Religious  Services: More than 4 times per year    Active Member of [employer] or Organizations: No    Attends Archivist Meetings: Never    Marital Status: Widowed  Intimate Partner Violence: Not At Risk (12/06/2020)   Humiliation, Afraid, Rape, and Kick questionnaire    Fear of Current or Ex-Partner: No    Emotionally Abused: No    Physically Abused: No    Sexually Abused: No    Prior to Admission medications   Medication Sig Start Date End Date Taking? Authorizing Provider  aspirin EC 81 MG tablet Take 81 mg by mouth daily.   Yes [provider]  atorvastatin (LIPITOR) 40 MG tablet Take 40 mg by mouth daily. 07/03/20  Yes [provider]  clopidogrel (PLAVIX) 75 MG tablet Take 1 tablet (75 mg total) by mouth daily. 01/19/21 01/19/22 Yes [name], MD  Empagliflozin-metFORMIN HCl ER (SYNJARDY XR) 25-1000 MG TB24 Take 1 tablet by mouth daily.   Yes [provider]  ferrous sulfate 325 (65 FE) MG tablet Take 325 mg by mouth daily.   Yes [provider]  gabapentin (NEURONTIN) 800 MG tablet Take 800 mg by mouth 2 (two) times daily. 07/03/20  Yes [provider]  insulin NPH-regular Human (NOVOLIN 70/30) (70-30) 100 UNIT/ML injection Inject 12 Units into the skin 2 (two) times daily with a meal. Patient taking differently: Inject 6 Units into the skin 2 (two) times daily with a meal. 07/22/20  Yes Newlin, Enobong, MD  isosorbide-hydrALAZINE (BIDIL) 20-37.5 MG tablet Take 1 tablet by mouth 3 (three) times daily. 11/18/21  Yes [name], MD  PRESCRIPTION MEDICATION Inject 0.1-1 mLs as directed See admin instructions. Tri-Mix Standard Strength 5 ml. Formula: Prostaglandin 10 mcg/ml, Papaverine 30 mg/ml, Phentolamine 1 mg/ml. Inject 0.1 ml into side of penis as directed as needed (to be injected immediately before sexual intercourse) may increase the dose by 0.1 ml every 48 hours to achieve an erection. Max dose 1 ml.   Yes [provider]   vitamin B-12 (CYANOCOBALAMIN) 500 MCG tablet Take 500 mcg by mouth daily. 08/24/21  Yes [provider]  carvedilol (COREG) 12.5 MG tablet Take 1 tablet (12.5 mg  total) by mouth 2 (two) times daily with a meal. 10/30/21 11/29/21  Arrien, Jimmy Picket, MD  furosemide (LASIX) 40 MG tablet Take 1 tablet (40 mg total) by mouth daily. 10/31/21 11/30/21  Arrien, Jimmy Picket, MD    Current Outpatient Medications  Medication Sig Dispense Refill   aspirin EC 81 MG tablet Take 81 mg by mouth daily.     atorvastatin (LIPITOR) 40 MG tablet Take 40 mg by mouth daily.     clopidogrel (PLAVIX) 75 MG tablet Take 1 tablet (75 mg total) by mouth daily. 90 tablet 1   Empagliflozin-metFORMIN HCl ER (SYNJARDY XR) 25-1000 MG TB24 Take 1 tablet by mouth daily.     ferrous sulfate 325 (65 FE) MG tablet Take 325 mg by mouth daily.     gabapentin (NEURONTIN) 800 MG tablet Take 800 mg by mouth 2 (two) times daily.     insulin NPH-regular Human (NOVOLIN 70/30) (70-30) 100 UNIT/ML injection Inject 12 Units into the skin 2 (two) times daily with a meal. (Patient taking differently: Inject 6 Units into the skin 2 (two) times daily with a meal.) 20 mL 6   isosorbide-hydrALAZINE (BIDIL) 20-37.5 MG tablet Take 1 tablet by mouth 3 (three) times daily. 90 tablet 2   PRESCRIPTION MEDICATION Inject 0.1-1 mLs as directed See admin instructions. Tri-Mix Standard Strength 5 ml. Formula: Prostaglandin 10 mcg/ml, Papaverine 30 mg/ml, Phentolamine 1 mg/ml. Inject 0.1 ml into side of penis as directed as needed (to be injected immediately before sexual intercourse) may increase the dose by 0.1 ml every 48 hours to achieve an erection. Max dose 1 ml.     vitamin B-12 (CYANOCOBALAMIN) 500 MCG tablet Take 500 mcg by mouth daily.     carvedilol (COREG) 12.5 MG tablet Take 1 tablet (12.5 mg total) by mouth 2 (two) times daily with a meal. 60 tablet 0   furosemide (LASIX) 40 MG tablet Take 1 tablet (40 mg total) by mouth daily. 30  tablet 0   Current Facility-Administered Medications  Medication Dose Route Frequency Provider Last Rate Last Admin   sodium chloride flush (NS) 0.9 % injection 3 mL  3 mL Intravenous Q12H Lorretta Harp, MD       sodium chloride flush (NS) 0.9 % injection 3 mL  3 mL Intravenous Q12H Lorretta Harp, MD        Allergies  Allergen Reactions   Percocet [Oxycodone-Acetaminophen] Nausea And Vomiting      Review of Systems:   General:  normal appetite, + decreased energy, no weight gain, no weight loss, no fever  Cardiac:  no chest pain with exertion, no chest pain at rest, + SOB with moderate exertion, no resting SOB, no PND, no orthopnea, no palpitations, no arrhythmia, no atrial fibrillation, no LE edema, no dizzy spells, no syncope  Respiratory:  + exertional shortness of breath, no home oxygen, no productive cough, + dry cough, no bronchitis, no wheezing, no hemoptysis, no asthma, no pain with inspiration or cough, no sleep apnea, no CPAP at night  GI:   no difficulty swallowing, no reflux, no frequent heartburn, no hiatal hernia, no abdominal pain, no constipation, no diarrhea, no hematochezia, no hematemesis, no melena  GU:   no dysuria,  no frequency, no urinary tract infection, no hematuria, no enlarged prostate, no kidney stones, no kidney disease  Vascular:  no pain suggestive of claudication, + pain in feet, + leg cramps, no varicose veins, no DVT, no non-healing foot ulcer  Neuro:  no stroke, no TIA's, no seizures, no headaches, no temporary blindness one eye,  no slurred speech, + peripheral neuropathy, no chronic pain, some balance problems related to neuropathy, no memory/cognitive dysfunction  Musculoskeletal: no arthritis, no joint swelling, no myalgias, no difficulty walking, normal mobility   Skin:   no rash, no itching, no skin infections, no pressure sores or ulcerations  Psych:   no anxiety, no depression, no nervousness, no unusual recent stress  Eyes:   no blurry  vision, no floaters, no recent vision changes, + wears glasses  ENT:   no hearing loss, no loose or painful teeth, no dentures, scheduled for extraction of 3 broken back teeth by Dr. Benson Norway on 12/09/21  Hematologic:  no easy bruising, no abnormal bleeding, no clotting disorder, no frequent epistaxis  Endocrine:  + diabetes, does check CBG's at home     Physical Exam:   BP (!) 150/91 (BP Location: Left Arm, Patient Position: Sitting)   Pulse 77   Resp 18   Ht '5\' 7"'$  (1.702 m)   Wt 185 lb (83.9 kg)   SpO2 96% Comment: RA  BMI 28.98 kg/m   General:  well-appearing  HEENT:  Unremarkable, NCAT, PERLA, EOMI  Neck:   no JVD, no bruits, no adenopathy   Chest:   clear to auscultation, symmetrical breath sounds, no wheezes, no rhonchi   CV:   RRR, 3/6 systolic murmur RSB, no diastolic murmur  Abdomen:  soft, non-tender, no masses   Extremities:  warm, well-perfused, pulses not palpable at ankles, no lower extremity edema, amputation of left 4th and 5th toes.  Rectal/GU  Deferred  Neuro:   Grossly non-focal and symmetrical throughout  Skin:   Clean and dry, no rashes, no breakdown  Diagnostic Tests:    ECHOCARDIOGRAM REPORT         Patient Name:   CHIDERA THIVIERGE Date of Exam: 10/28/2021  Medical Rec #:  [id number]       Height:       67.0 in  Accession #:    [id number]      Weight:       184.1 lb  Date of Birth:  08/24/1950        BSA:          1.952 m  Patient Age:    61 years        BP:           119/87 mmHg  Patient Gender: M               HR:           88 bpm.  Exam Location:  Inpatient   Procedure: 2D Echo, Cardiac Doppler and Color Doppler   Indications:    Congestive Heart Failure I50.9     History:        Patient has prior history of Echocardiogram examinations,  most                  recent 12/21/2020. CHF, CAD, Aortic Valve Disease; Risk                  Factors:Hypertension, Dyslipidemia and Diabetes.     Sonographer:    Ronny Flurry  Sonographer#2:  Meagan Baucom  RDCS, FE, PE  Referring Phys: [place]     1. Left ventricular ejection fraction, by estimation, is 20%. The left  ventricle has severely decreased function. The left ventricle demonstrates  global  hypokinesis. Left ventricular diastolic parameters are consistent  with Grade II diastolic  dysfunction (pseudonormalization).   2. Right ventricular systolic function is moderately reduced. The right  ventricular size is normal. Tricuspid regurgitation signal is inadequate  for assessing PA pressure.   3. Left atrial size was mildly dilated.   4. The mitral valve is degenerative. Trivial mitral valve regurgitation.  No evidence of mitral stenosis.   5. The aortic valve is abnormal. There is severe calcifcation of the  aortic valve. Aortic valve regurgitation is mild to moderate. Low flow low  gradient aortic valve stenosis, with at least moderate aortic valve  stenosis with SVI of 19 and DI 0.27.  Aortic valve mean gradient measures 12.3 mmHg. Aortic valve Vmax measures  2.38 m/s.   6. The inferior vena cava is dilated in size with <50% respiratory  variability, suggesting right atrial pressure of 15 mmHg.   Comparison(s): A prior study was performed on 12/21/20. Prior images  reviewed side by side. LVEF has decreased. Definity contrast not  performed. Consider AV calcium scoring for further evaluation of AV  stenosis.   FINDINGS   Left Ventricle: Left ventricular ejection fraction, by estimation, is  20%. The left ventricle has severely decreased function. The left  ventricle demonstrates global hypokinesis. The left ventricular internal  cavity size was normal in size. There is no  left ventricular hypertrophy. Left ventricular diastolic parameters are  consistent with Grade II diastolic dysfunction (pseudonormalization).   Right Ventricle: The right ventricular size is normal. No increase in  right ventricular wall thickness. Right ventricular  systolic function is  moderately reduced. Tricuspid regurgitation signal is inadequate for  assessing PA pressure. The tricuspid  regurgitant velocity is 1.56 m/s, and with an assumed right atrial  pressure of 15 mmHg, the estimated right ventricular systolic pressure is  35.5 mmHg.   Left Atrium: Left atrial size was mildly dilated.   Right Atrium: Right atrial size was normal in size.   Pericardium: Trivial pericardial effusion is present.   Mitral Valve: The mitral valve is degenerative in appearance. Mild to  moderate mitral annular calcification. Trivial mitral valve regurgitation.  No evidence of mitral valve stenosis. The mean mitral valve gradient is  2.0 mmHg with average heart rate of  80 bpm.   Tricuspid Valve: The tricuspid valve is normal in structure. Tricuspid  valve regurgitation is mild.   Aortic Valve: The aortic valve is abnormal. There is severe calcifcation  of the aortic valve. Aortic valve regurgitation is mild to moderate.  Moderate aortic stenosis is present. Aortic valve mean gradient measures  12.3 mmHg. Aortic valve peak gradient  measures 22.7 mmHg. Aortic valve area, by VTI measures 0.83 cm.   Pulmonic Valve: The pulmonic valve was normal in structure. Pulmonic valve  regurgitation is mild.   Aorta: The aortic root is normal in size and structure.   Venous: The inferior vena cava is dilated in size with less than 50%  respiratory variability, suggesting right atrial pressure of 15 mmHg.   IAS/Shunts: The interatrial septum was not well visualized.      LEFT VENTRICLE  PLAX 2D  LVIDd:         4.70 cm   Diastology  LVIDs:         3.70 cm   LV e' medial:    3.69 cm/s  LV PW:         1.10 cm   LV E/e' medial:  23.1  LV IVS:  1.15 cm   LV e' lateral:   6.06 cm/s  LVOT diam:     2.00 cm   LV E/e' lateral: 14.0  LV SV:         36  LV SV Index:   19  LVOT Area:     3.14 cm      RIGHT VENTRICLE  RV S prime:     7.71 cm/s  TAPSE  (M-mode): 1.1 cm   LEFT ATRIUM             Index        RIGHT ATRIUM           Index  LA diam:        4.60 cm 2.36 cm/m   RA Area:     18.40 cm  LA Vol (A2C):   73.0 ml 37.39 ml/m  RA Volume:   51.40 ml  26.33 ml/m  LA Vol (A4C):   57.1 ml 29.25 ml/m  LA Biplane Vol: 66.6 ml 34.12 ml/m   AORTIC VALVE  AV Area (Vmax):    1.00 cm  AV Area (Vmean):   0.90 cm  AV Area (VTI):     0.83 cm  AV Vmax:           238.10 cm/s  AV Vmean:          162.067 cm/s  AV VTI:            0.437 m  AV Peak Grad:      22.7 mmHg  AV Mean Grad:      12.3 mmHg  LVOT Vmax:         75.80 cm/s  LVOT Vmean:        46.400 cm/s  LVOT VTI:          0.116 m  LVOT/AV VTI ratio: 0.27     AORTA  Ao Root diam: 3.20 cm  Ao Asc diam:  3.40 cm   MITRAL VALVE               TRICUSPID VALVE  MV Area (PHT): 3.46 cm    TR Peak grad:   9.7 mmHg  MV Mean grad:  2.0 mmHg    TR Vmax:        156.00 cm/s  MV Decel Time: 219 msec  MV E velocity: 85.10 cm/s  SHUNTS  MV A velocity: 66.00 cm/s  Systemic VTI:  0.12 m  MV E/A ratio:  1.29        Systemic Diam: 2.00 cm   Cherlynn Kaiser MD  Electronically signed by Cherlynn Kaiser MD  Signature Date/Time: 10/29/2021/8:03:59 AM         Final     Physicians  Panel Physicians Referring Physician Case Authorizing Physician  Burnell Blanks, MD (Primary)     Procedures  RIGHT/LEFT HEART CATH AND CORONARY ANGIOGRAPHY   Conclusion      Prox RCA lesion is 60% stenosed.   Ramus lesion is 40% stenosed.   2nd Mrg lesion is 60% stenosed.   Mid LAD lesion is 30% stenosed.   Dist LAD lesion is 40% stenosed.   Mild non-obstructive disease in the mid and distal LAD Mild non-obstructive disease in the Circumflex and Ramus intermediate branches The RCA is a large dominant vessel with moderate proximal to mid stenosis that does not appear to be flow limiting.  Non-ischemic cardiomyopathy Severe low flow/low gradient aortic stenosis by echo  Elevated right heart  pressures.  Recommendations: I would recommend medical management of his non-obstructive CAD. He would be a candidate for TAVR or surgical AVR but given low EF, would favor TAVR. I would recommend continued diuresis today and if he feels well tomorrow, he could be discharged home and then come back for outpatient pre-TAVR CT scans. I would not plan the CT scans this weekend given his CKD. Our structural heart team will contact him to arrange the scans and the visit with our CT surgeon.    Indications  Severe aortic stenosis [I35.0 (ICD-10-CM)]  Acute on chronic systolic heart failure (HCC) [I50.23 (ICD-10-CM)]   Procedural Details  Technical Details Indication: Severe low flow/low gradient AS, NICM, acute on chronic systolic CHF  Procedure: The risks, benefits, complications, treatment options, and expected outcomes were discussed with the patient. The patient and/or family concurred with the proposed plan, giving informed consent. The patient was sedated with Versed and Fentanyl. The IV catheter in the right antecubital vein was changed for a 5 [place] sheath. Right heart catheterization performed with a balloon tipped catheter. The right wrist was prepped and draped in a sterile fashion. 1% lidocaine was used for local anesthesia. Using the modified Seldinger access technique, a 5 French sheath was placed in the right radial artery. 3 mg Verapamil was given through the sheath. Weight based IV heparin was given. Standard diagnostic catheters were used to perform selective coronary angiography. I also engaged the RCA with an AL-1 catheter for better images. I did not cross the aortic valve. All catheter exchanges were performed over an exchange length guidewire.   The sheath was removed from the right radial artery and a hemostasis band was applied at the arteriotomy site on the right wrist.      Estimated blood loss <50 mL.   During this procedure medications were administered to achieve and  maintain moderate conscious sedation while the patient's heart rate, blood pressure, and oxygen saturation were continuously monitored and I was present face-to-face 100% of this time.   Medications (Filter: Administrations occurring from 1309 to 1408 on 10/29/21) Heparin (Porcine) in NaCl 1000-0.9 UT/500ML-% SOLN (mL) Total volume:  1,000 mL  Date/Time Rate/Dose/Volume Action   10/29/21 1311 500 mL Given   1311 500 mL Given    fentaNYL (SUBLIMAZE) injection (mcg) Total dose:  50 mcg  Date/Time Rate/Dose/Volume Action   10/29/21 1324 50 mcg Given    midazolam (VERSED) injection (mg) Total dose:  2 mg  Date/Time Rate/Dose/Volume Action   10/29/21 1324 2 mg Given    lidocaine (PF) (XYLOCAINE) 1 % injection (mL) Total volume:  4 mL  Date/Time Rate/Dose/Volume Action   10/29/21 1329 2 mL Given   Canceled Entry   1336 2 mL Given    Radial Cocktail/Verapamil only (mL) Total volume:  10 mL  Date/Time Rate/Dose/Volume Action   10/29/21 1338 10 mL Given    heparin sodium (porcine) injection (Units) Total dose:  4,000 Units  Date/Time Rate/Dose/Volume Action   10/29/21 1343 4,000 Units Given    iohexol (OMNIPAQUE) 350 MG/ML injection (mL) Total volume:  55 mL  Date/Time Rate/Dose/Volume Action   10/29/21 1402 55 mL Given    aspirin EC tablet 81 mg (mg) Total dose:  Cannot be calculated* Dosing weight:  83.5  *Administration dose not documented Date/Time Rate/Dose/Volume Action   10/29/21 1309 *Not included in total MAR Hold    atorvastatin (LIPITOR) tablet 40 mg (mg) Total dose:  Cannot be calculated* Dosing weight:  83.5  *Administration dose not documented  Date/Time Rate/Dose/Volume Action   10/29/21 1309 *Not included in total MAR Hold    carvedilol (COREG) tablet 12.5 mg (mg) Total dose:  Cannot be calculated* Dosing weight:  81.7  *Administration dose not documented Date/Time Rate/Dose/Volume Action   10/29/21 1309 *Not included in total MAR Hold     clopidogrel (PLAVIX) tablet 75 mg (mg) Total dose:  Cannot be calculated* Dosing weight:  83.5  *Administration dose not documented Date/Time Rate/Dose/Volume Action   10/29/21 1309 *Not included in total MAR Hold    enoxaparin (LOVENOX) injection 40 mg (mg) Total dose:  Cannot be calculated* Dosing weight:  83.5  *Administration dose not documented Date/Time Rate/Dose/Volume Action   10/29/21 1309 *Not included in total MAR Hold    furosemide (LASIX) tablet 40 mg (mg) Total dose:  Cannot be calculated* Dosing weight:  81.7  *Administration dose not documented Date/Time Rate/Dose/Volume Action   10/29/21 1309 *Not included in total MAR Hold    gabapentin (NEURONTIN) capsule 800 mg (mg) Total dose:  Cannot be calculated* Dosing weight:  83.5  *Administration dose not documented Date/Time Rate/Dose/Volume Action   10/29/21 1309 *Not included in total MAR Hold    insulin aspart (novoLOG) injection 0-9 Units (Units) Total dose:  Cannot be calculated* Dosing weight:  83.5  *Administration dose not documented Date/Time Rate/Dose/Volume Action   10/29/21 1309 *Not included in total MAR Hold    insulin aspart protamine- aspart (NOVOLOG MIX 70/30) injection 7 Units (Units) Total dose:  Cannot be calculated* Dosing weight:  83.5  *Administration dose not documented Date/Time Rate/Dose/Volume Action   10/29/21 1309 *Not included in total MAR Hold    insulin aspart protamine- aspart (NOVOLOG MIX 70/30) injection 8 Units (Units) Total dose:  Cannot be calculated* Dosing weight:  83.5  *Administration dose not documented Date/Time Rate/Dose/Volume Action   10/29/21 1309 *Not included in total MAR Hold    lisinopril (ZESTRIL) tablet 10 mg (mg) Total dose:  Cannot be calculated* Dosing weight:  83.5  *Administration dose not documented Date/Time Rate/Dose/Volume Action   10/29/21 1309 *Not included in total MAR Hold    sodium chloride flush (NS) 0.9 % injection 3 mL  (mL) Total dose:  Cannot be calculated* Dosing weight:  81.7  *Administration dose not documented Date/Time Rate/Dose/Volume Action   10/29/21 1309 *Not included in total MAR Hold   1315 *Not included in total Automatically Held    spironolactone (ALDACTONE) tablet 12.5 mg (mg) Total dose:  Cannot be calculated* Dosing weight:  81.7  *Administration dose not documented Date/Time Rate/Dose/Volume Action   10/29/21 1309 *Not included in total MAR Hold    Sedation Time  Sedation Time Physician-1: 35 minutes 46 seconds Contrast  Medication Name Total Dose  iohexol (OMNIPAQUE) 350 MG/ML injection 55 mL   Radiation/Fluoro  Fluoro time: 12.6 (min) DAP: 17408 (mGycm2) Cumulative Air Kerma: 144 (mGy) Complications  Complications documented before study signed (10/29/2021  2:19 PM)   RIGHT/LEFT HEART CATH AND CORONARY ANGIOGRAPHY  None Documented by Darla Lesches, RN 10/29/2021  2:03 PM  Date Found: 10/29/2021  Time Range: Intraprocedure       Coronary Findings  Diagnostic Dominance: Right Left Anterior Descending  Vessel is large.  Mid LAD lesion is 30% stenosed.  Dist LAD lesion is 40% stenosed.    Ramus Intermedius  Ramus lesion is 40% stenosed.    Left Circumflex  Vessel is large.    Second Obtuse Marginal Branch  2nd Mrg lesion is 60% stenosed.    Right Coronary  Artery  Vessel is large.  Prox RCA lesion is 60% stenosed.    Intervention   No interventions have been documented.   Coronary Diagrams  Diagnostic Dominance: Right  Intervention  Implants     No implant documentation for this case.   Syngo Images   Show images for CARDIAC CATHETERIZATION Images on Long Term Storage   Show images for Clete, Kuch to Procedure Log  Procedure Log    Hemo Data  Flowsheet Row Most Recent Value  Fick Cardiac Output 26.28 L/min  Fick Cardiac Output Index 13.59 (L/min)/BSA  RA A Wave 12 mmHg  RA V Wave 12 mmHg  RA Mean 11 mmHg  RV  Systolic Pressure 49 mmHg  RV Diastolic Pressure 16 mmHg  RV EDP 20 mmHg  PA Systolic Pressure 50 mmHg  PA Diastolic Pressure 29 mmHg  PA Mean 38 mmHg  PW A Wave 20 mmHg  PW V Wave 20 mmHg  PW Mean 19 mmHg  AO Systolic Pressure 630 mmHg  AO Diastolic Pressure 66 mmHg  AO Mean 88 mmHg  QP/QS 1  TPVR Index 2.8 HRUI  TSVR Index 6.48 HRUI  PVR SVR Ratio 0.25  TPVR/TSVR Ratio 0.43    ADDENDUM REPORT: 11/16/2021 13:29   CLINICAL DATA:  Aortic Valve pathology with assessment for TAVR   EXAM: Cardiac TAVR CT   TECHNIQUE: The patient was scanned on a Siemens Force 160 slice scanner. A 120 kV retrospective scan was triggered in the descending thoracic aorta at 111 HU's. Gantry rotation speed was 270 msecs and collimation was .9 mm. No beta blockade or nitro were given. The 3D data set was reconstructed in 5% intervals of the R-R cycle. Systolic and diastolic phases were analyzed on a dedicated work station using MPR, MIP and VRT modes. The patient received 100 cc of contrast.   FINDINGS: Aortic Valve: Severely thickened aortic valve with heavy calcification and reduced excursion the planimeter valve area is 0.99 Sq cm consistent with severe aortic stenosis   Number of leaflets: Three- calcification of the left valve leaflet without true raphe/forme fruste pathology   LVOT calcification: Inferior LVOT calcification below the annulus in the intervalvular fibrosa   Annular calcification: Severe one calcification that protrudes into the lumen and presence of LVOT calcification   Aortic Valve Calcium Score: 2478   Presence of basal septal hypertrophy: No   Perimembranous septal diameter: 7 mm   Mitral Valve: Moderate mitral annular calcification   Aortic Annulus Measurements- 20% Phase   Major annulus diameter: 32 mm   Minor annulus diameter: 23 mm   Annular perimeter: 88 mm   Annular area: 5.74 cm2   Aortic Root Measurements   Sinotubular Junction: 29 mm; there  is annular calcification at the level of the Sinotubular junction superior to the left sinus.   Ascending Thoracic Aorta: 34 mm   Aortic Arch: 26 mm   Descending Thoracic Aorta: 26 mm   Sinus of Valsalva Measurements:   Right coronary cusp width: 32 mm   Left coronary cusp width: 34 mm   Non coronary cusp width: 34 mm   Coronary Artery Height above Annulus:   Left Main: 14 mm   Left SoV height: 22 mm   Right Coronary: 16 mm   Right SoV height: 21 mm   Optimum Fluoroscopic Angle for Delivery: LAO 2, CRA 5   Valves for structural team consideration: 29 mm Sapien Valve recommended   Suitable sinus heights and diameters for a 31  mm CoreValve, but with slight sinus asymmetry   Non TAVR Valve Findings:   Coronary Arteries: Normal coronary origin. Study not completed with nitroglycerin.   Coronary Calcium Score:   Left main: 0   Left anterior descending artery: 1100   Left circumflex artery: 94   Right coronary artery: 842   Ramus intermedius artery: 11   Total: 2037   Percentile: 98th for age, sex, and race matched control.   Small PFO   Pulmonary veins: Normal anatomy.   Main Pulmonary artery: Mild dilation 30 mm   Left atrial appendage: Delayed contrast uptake into the left atrial appendage tip, reviewed with CAC sequence thrombus appears less probably.   Extra Cardiac Findings as per separate reporting.   IMPRESSION: 1. Severe aortic stenosis. Findings pertinent to TAVR procedure are detailed above.   2. Patient's total coronary artery calcium score is 2037, which is 98th percentile for subjects of the same age, gender, and race based populations.   RECOMMENDATIONS:   The proposed cut-off value of 1,651 AU yielded a 93 % sensitivity and 75 % specificity in grading AS severity in patients with classical low-flow, low-gradient AS. Proposed different cut-off values to define severe AS for men and women as 2,065 AU and 1,274 AU, respectively.  The joint European and American recommendations for the assessment of AS consider the aortic valve calcium score as a continuum - a very high calcium score suggests severe AS and a low calcium score suggests severe AS is unlikely.   Kerman Passey, et al. 2017 ESC/EACTS Guidelines for the management of valvular heart disease. Eur Heart J [phone]   Coronary artery calcium (CAC) score is a strong predictor of incident coronary heart disease (CHD) and provides predictive information beyond traditional risk factors. CAC scoring is reasonable to use in the decision to withhold, postpone, or initiate statin therapy in intermediate-risk or selected borderline-risk asymptomatic adults (age 73-75 years and LDL-C >=70 to <190 mg/dL) who do not have diabetes or established atherosclerotic cardiovascular disease (ASCVD).* In intermediate-risk (10-year ASCVD risk >=7.5% to <20%) adults or selected borderline-risk (10-year ASCVD risk >=5% to <7.5%) adults in whom a CAC score is measured for the purpose of making a treatment decision the following recommendations have been made:   If CAC = 0, it is reasonable to withhold statin therapy and reassess in 5 to 10 years, as long as higher risk conditions are absent (diabetes mellitus, family history of premature CHD in first degree relatives (males <55 years; females <65 years), cigarette smoking, LDL >=190 mg/dL or other independent risk factors).   If CAC is 1 to 99, it is reasonable to initiate statin therapy for patients >=74 years of age.   If CAC is >=100 or >=75th percentile, it is reasonable to initiate statin therapy at any age.   Cardiology referral should be considered for patients with CAC scores >=400 or >=75th percentile.   *2018 AHA/ACC/AACVPR/AAPA/ABC/ACPM/ADA/AGS/APhA/ASPC/NLA/PCNA Guideline on the Management of Blood Cholesterol: A Report of the American College of Cardiology/American Heart Association Task  Force on Clinical Practice Guidelines. J Am Coll Cardiol. 2019;73(24):3168-3209.   Mahesh  Chandrasekhar     Electronically Signed   By: Rudean Haskell M.D.   On: 11/16/2021 13:29   Narrative & Impression  CLINICAL DATA:  Aortic valve replacement (TAVR), pre-op eval. Severe aortic valve stenosis.   EXAM: CT ANGIOGRAPHY CHEST, ABDOMEN AND PELVIS   TECHNIQUE: Multidetector CT imaging through the chest, abdomen and pelvis was performed using  the standard protocol during bolus administration of intravenous contrast. Multiplanar reconstructed images and MIPs were obtained and reviewed to evaluate the vascular anatomy.   RADIATION DOSE REDUCTION: This exam was performed according to the departmental dose-optimization program which includes automated exposure control, adjustment of the mA and/or kV according to patient size and/or use of iterative reconstruction technique.   CONTRAST:  125m OMNIPAQUE IOHEXOL 350 MG/ML SOLN   COMPARISON:  05/27/2020 CT angiogram of the chest, abdomen and pelvis. 10/28/2021 chest CT angiogram.   FINDINGS: CTA CHEST FINDINGS   Cardiovascular: Borderline mild cardiomegaly. Diffuse thickening and coarse calcification of the aortic valve. No significant pericardial effusion/thickening. Three-vessel coronary atherosclerosis. Atherosclerotic nonaneurysmal thoracic aorta. Normal caliber pulmonary arteries. No central pulmonary emboli.   Mediastinum/Nodes: No discrete thyroid nodules. Unremarkable esophagus. No pathologically enlarged axillary, mediastinal or hilar lymph nodes.   Lungs/Pleura: No pneumothorax. No pleural effusion. No acute consolidative airspace disease or lung masses. Peripheral right middle lobe 0.3 cm tiny pulmonary nodule (series 5/image 42), decreased from 0.7 cm on recent 10/28/2021 CT. No additional significant pulmonary nodules.   Musculoskeletal: No aggressive appearing focal osseous lesions. Mild thoracic  spondylosis.   CTA ABDOMEN AND PELVIS FINDINGS   Hepatobiliary: Normal liver with no liver mass. Normal gallbladder with no radiopaque cholelithiasis. No biliary ductal dilatation.   Pancreas: Normal, with no mass or duct dilation.   Spleen: Normal size. No mass.   Adrenals/Urinary Tract: Normal adrenals. No contour deforming renal masses. No hydronephrosis. Normal bladder.   Stomach/Bowel: Normal non-distended stomach. Normal caliber small bowel with no small bowel wall thickening. Normal appendix. Normal large bowel with no diverticulosis, large bowel wall thickening or pericolonic fat stranding.   Vascular/Lymphatic: Atherosclerotic nonaneurysmal abdominal aorta. No pathologically enlarged lymph nodes in the abdomen or pelvis.   Reproductive: Normal size prostate.   Other: No pneumoperitoneum, ascites or focal fluid collection.   Musculoskeletal: No aggressive appearing focal osseous lesions. Chronic trabecular thickening and mild bony expansion throughout the right greater than left hemipelvis, unchanged and compatible with Paget's disease. Mild lumbar spondylosis.   VASCULAR MEASUREMENTS PERTINENT TO TAVR:   AORTA:   Minimal Aortic Diameter-16.7 x 15.8 mm   Severity of Aortic Calcification-mild-to-moderate   RIGHT PELVIS:   Right Common Iliac Artery -   Minimal Diameter-11.5 x 9.0 mm   Tortuosity-moderate   Calcification-moderate   Right External Iliac Artery -   Minimal Diameter-8.4 x 7.6 mm   Tortuosity-moderate to severe   Calcification-mild   Right Common Femoral Artery -   Minimal Diameter-7.2 x 6.9 mm   Tortuosity-mild   Calcification-moderate   Focal high-grade stenosis noted in the proximal right superficial femoral artery (series 3/image 717). Segmental occlusion of the right deep femoral artery with distal reconstitution.   LEFT PELVIS:   Left Common Iliac Artery -   Minimal Diameter-9.6 x 9.4 mm   Tortuosity-mild-to-moderate    Calcification-marked   Left External Iliac Artery -   Minimal Diameter-8.8 x 8.4 mm   Tortuosity-mild-to-moderate   Calcification-none   Left Common Femoral Artery -   Minimal Diameter-7.5 x 7.4 mm   Tortuosity-mild   Calcification-moderate sign rib   Review of the MIP images confirms the above findings.   IMPRESSION: 1. Vascular findings and measurements pertinent to potential TAVR procedure, as detailed. 2. Diffuse thickening and coarse calcification of the aortic valve, compatible with the reported clinical history of severe aortic stenosis. 3. Three-vessel coronary atherosclerosis. 4. Tiny right middle lobe 0.3 cm pulmonary nodule, decreased in size since  recent 10/28/2021 CT, most likely a resolving benign inflammatory nodule. 5. 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 6. Aortic Atherosclerosis (ICD10-I70.0).     Electronically Signed   By: Ilona Sorrel M.D.   On: 11/16/2021 12:39    Impression:  This 71 year old gentleman has stage D2, low-flow/low gradient severe aortic stenosis with ejection fraction of 20% and NYHA class II symptoms of exertional fatigue and shortness of breath consistent with combined chronic systolic and diastolic congestive heart failure.  His ejection fraction 1 year ago was 55%.  The etiology of his drop in ejection fraction is not completely clear.  He has been seen by the heart failure team and work-up for amyloid heart disease was recommended.  Severe aortic stenosis has probably also contributed to this.  I have personally reviewed his 2D echocardiogram, cardiac catheterization, and CTA studies. His echo shows a severely calcified aortic valve with restricted leaflet mobility.  The mean gradient was only 12 mmHg with a valve area by VTI of 0.83 cm with a very low stroke-volume index of 19.  Left ventricular ejection fraction is 20%.  Cardiac catheterization shows moderate nonobstructive coronary disease with  moderately elevated right heart pressures.  I agree that aortic valve replacement is indicated in this patient for relief of his symptoms and to prevent further left ventricular dysfunction.  Given his age and multiple comorbidities I think that transcatheter aortic valve replacement would be the best option for treating him.  His gated cardiac CTA shows anatomy suitable for TAVR using a SAPIEN 3 valve.  His abdominal and pelvic CTA shows adequate pelvic vascular anatomy to allow transfemoral insertion.  The patient was counseled at length regarding treatment alternatives for management of severe symptomatic aortic stenosis. The risks and benefits of surgical intervention has been discussed in detail. Long-term prognosis with medical therapy was discussed. Alternative approaches such as conventional surgical aortic valve replacement, transcatheter aortic valve replacement, and palliative medical therapy were compared and contrasted at length. This discussion was placed in the context of the patient's own specific clinical presentation and past medical history. All of his questions have been addressed.   Following the decision to proceed with transcatheter aortic valve replacement, a discussion was held regarding what types of management strategies would be attempted intraoperatively in the event of life-threatening complications, including whether or not the patient would be considered a candidate for the use of cardiopulmonary bypass and/or conversion to open sternotomy for attempted surgical intervention.  Despite having multiple comorbidities he is still very active for his age and I think he would be a candidate for emergent sternotomy to manage any intraoperative complications.  The patient is aware of the fact that transient use of cardiopulmonary bypass may be necessary. The patient has been advised of a variety of complications that might develop including but not limited to risks of death, stroke,  paravalvular leak, aortic dissection or other major vascular complications, aortic annulus rupture, device embolization, cardiac rupture or perforation, mitral regurgitation, acute myocardial infarction, arrhythmia, heart block or bradycardia requiring permanent pacemaker placement, congestive heart failure, respiratory failure, renal failure, pneumonia, infection, other late complications related to structural valve deterioration or migration, or other complications that might ultimately cause a temporary or permanent loss of functional independence or other long term morbidity. The patient provides full informed consent for the procedure as described and all questions were answered.      Plan:  He is scheduled to have dental extractions on 12/09/2021 and will  then be scheduled for transfemoral TAVR using a SAPIEN 3 valve.  I spent 60 minutes performing this consultation and > 50% of this time was spent face to face counseling and coordinating the care of this patient's severe symptomatic aortic stenosis.   Gaye Pollack, MD 12/06/2021

## 2021-12-06 NOTE — H&P (View-Only) (Signed)
Patient ID: Caleb [name], male   DOB: [birth year], 71 y.o.   MRN: [id number]  [facility]       [address]       [place] [address]             [phone]          CARDIOTHORACIC SURGERY CONSULTATION REPORT  PCP is [name], MD Referring Provider is [name], MD Primary Cardiologist is Quay Burow, MD  Reason for consultation:  Severe aortic stenosis  HPI:  The patient is a 71 year old gentleman with history of diabetes with peripheral neuropathy, hypertension, hyperlipidemia, carotid artery disease status post bilateral TCAR, PAD status post previous bilateral popliteal artery stenting, stage III chronic kidney disease, nonischemic cardiomyopathy with chronic combined systolic and diastolic congestive heart failure with an LVEF of 20% who was recently admitted for acute systolic heart failure.  The patient is quite active exercising multiple days per week at the gym, walking at least a mile on a treadmill and doing a rowing machine for up to an hour as well as lifting weights.  He reports feeling well with no symptoms until he recently drove down to [place] to take his grandson to [employer] school.  He drove in a car for approximately 20 hours and felt fine until he got out of the car at home and developed sudden severe shortness of breath.  He laid down on his couch and fell asleep until the next morning when he went to the [place] and was told to come to [facility] for admission.  A 2D echocardiogram showed severe calcification of the aortic valve with a mean gradient of 12.3 mmHg and a stroke-volume index of 19 with an ejection fraction of 20%.  There was mild to moderate aortic insufficiency.  Right ventricular systolic function was moderately reduced.  There was trivial mitral regurgitation.  His prior echo in 12/2020 showed an ejection fraction of 50 to 55% with normal RV function.  The mean  gradient across the aortic valve at that time was 17 mmHg.  Cardiac catheterization on 10/29/2021 showed mild nonobstructive disease in the mid and distal LAD.  There was mild nonobstructive disease in left circumflex and ramus branches.  The RCA was a large dominant vessel with moderate proximal to mid stenosis that did not appear to be flow-limiting.  Right heart pressures were elevated with a PA pressure of 50/29 and a mean of 38.  Mean right atrial pressure was 11.  Since discharge on Lasix he has been feeling fairly well.  He has gone back to the gym and is walking on a treadmill but not using a rowing machine.  He tried walking at [employer] a couple weeks ago but after 15 minutes walking hills he had marked weakness in his legs and it took him 45 minutes to get back to his car.  He has been lifting some weights.  He denies any chest pain or pressure.  He has some exertional shortness of breath but is mild.  He denies any dizziness or syncope.  He has had no orthopnea or peripheral edema.  He is here today by himself.  He is widowed and lives alone.  He has 3 daughters who live out of town, 1 of whom is a Lexicographer.  Past Medical History:  Diagnosis Date   Aortic stenosis    Carotid artery occlusion    Diabetes mellitus without complication ([place])  Dyslipidemia 09/27/2012   Erectile dysfunction 09/27/2012   Heart murmur    Hypercholesteremia    Hyperlipidemia 08/12/2012   Hypertension    Hyponatremia 08/14/2012   Neuropathy    Pancreatitis    Pancreatitis, acute 09/27/2012   Peripheral vascular disease ([place])    Vitamin D deficiency 06/23/2015    Past Surgical History:  Procedure Laterality Date   ABDOMINAL AORTOGRAM W/LOWER EXTREMITY N/A 08/08/2019   Procedure: ABDOMINAL AORTOGRAM W/LOWER EXTREMITY;  Surgeon: Lorretta Harp, MD;  Location: [place] [facility];  Service: Cardiovascular;  Laterality: N/A;   ABDOMINAL AORTOGRAM W/LOWER EXTREMITY N/A 11/18/2019   Procedure:  ABDOMINAL AORTOGRAM W/LOWER EXTREMITY;  Surgeon: Lorretta Harp, MD;  Location: [place] [facility];  Service: Cardiovascular;  Laterality: N/A;   COLONOSCOPY  12 years ago   in [place] clinic= normal exam per pt   KNEE SURGERY     PERIPHERAL VASCULAR INTERVENTION Right 08/08/2019   Procedure: PERIPHERAL VASCULAR INTERVENTION;  Surgeon: Lorretta Harp, MD;  Location: [place] [facility];  Service: Cardiovascular;  Laterality: Right;   PERIPHERAL VASCULAR INTERVENTION Left 11/18/2019   Procedure: PERIPHERAL VASCULAR INTERVENTION;  Surgeon: Lorretta Harp, MD;  Location: [place] [facility];  Service: Cardiovascular;  Laterality: Left;  left popliteal artery   RIGHT/LEFT HEART CATH AND CORONARY ANGIOGRAPHY N/A 10/29/2021   Procedure: RIGHT/LEFT HEART CATH AND CORONARY ANGIOGRAPHY;  Surgeon: Burnell Blanks, MD;  Location: [place] [facility];  Service: Cardiovascular;  Laterality: N/A;   TRANSCAROTID ARTERY REVASCULARIZATION  Left 12/16/2019   Procedure: TRANSCAROTID ARTERY REVASCULARIZATION;  Surgeon: Elam Dutch, MD;  Location: [facility] OR;  Service: Vascular;  Laterality: Left;   TRANSCAROTID ARTERY REVASCULARIZATION  Right 02/03/2020   Procedure: RIGHT TRANSCAROTID ARTERY REVASCULARIZATION;  Surgeon: Elam Dutch, MD;  Location: [facility] OR;  Service: Vascular;  Laterality: Right;   ULTRASOUND GUIDANCE FOR VASCULAR ACCESS Right 12/16/2019   Procedure: ULTRASOUND GUIDANCE FOR VASCULAR ACCESS;  Surgeon: Elam Dutch, MD;  Location: [facility] OR;  Service: Vascular;  Laterality: Right;   ULTRASOUND GUIDANCE FOR VASCULAR ACCESS Right 02/03/2020   Procedure: ULTRASOUND GUIDANCE FOR VASCULAR ACCESS;  Surgeon: Elam Dutch, MD;  Location: [facility] OR;  Service: Vascular;  Laterality: Right;   VASECTOMY      Family History  Problem Relation Age of Onset   CAD Father    Hypertension Father    Alcohol abuse Father        Cause of death   Diabetes Mother    Colon polyps Mother    CAD Brother  27       CABG   Colon cancer Neg Hx    Esophageal cancer Neg Hx    Rectal cancer Neg Hx    Stomach cancer Neg Hx     Social History   Socioeconomic History   Marital status: Single    Spouse name: Not on file   Number of children: 3   Years of education: Not on file   Highest education level: High school graduate  Occupational History   Occupation: Caregiver    Comment: Special needs    Comment: retired  Tobacco Use   Smoking status: Never   Smokeless tobacco: Never  Vaping Use   Vaping Use: Never used  Substance and Sexual Activity   Alcohol use: Not Currently    Comment: rare   Drug use: No   Sexual activity: Not Currently  Other Topics Concern   Not on file  Social History Narrative  Widower.  3 daughters and 3 grandchildren.     Social Determinants of Health   Financial Resource Strain: Low Risk  (10/29/2021)   Overall Financial Resource Strain (CARDIA)    Difficulty of Paying Living Expenses: Not hard at all  Food Insecurity: No Food Insecurity (10/29/2021)   Hunger Vital Sign    Worried About Running Out of Food in the Last Year: Never true    Ran Out of Food in the Last Year: Never true  Transportation Needs: No Transportation Needs (10/29/2021)   PRAPARE - Hydrologist (Medical): No    Lack of Transportation (Non-Medical): No  Physical Activity: Sufficiently Active (12/06/2020)   Exercise Vital Sign    Days of Exercise per Week: 4 days    Minutes of Exercise per Session: 60 min  Stress: No Stress Concern Present (12/06/2020)   [place]    Feeling of Stress : Not at all  Social Connections: Moderately Isolated (12/06/2020)   Social Connection and Isolation Panel [NHANES]    Frequency of Communication with Friends and Family: More than three times a week    Frequency of Social Gatherings with Friends and Family: More than three times a week    Attends Religious  Services: More than 4 times per year    Active Member of [employer] or Organizations: No    Attends Archivist Meetings: Never    Marital Status: Widowed  Intimate Partner Violence: Not At Risk (12/06/2020)   Humiliation, Afraid, Rape, and Kick questionnaire    Fear of Current or Ex-Partner: No    Emotionally Abused: No    Physically Abused: No    Sexually Abused: No    Prior to Admission medications   Medication Sig Start Date End Date Taking? Authorizing Provider  aspirin EC 81 MG tablet Take 81 mg by mouth daily.   Yes [provider]  atorvastatin (LIPITOR) 40 MG tablet Take 40 mg by mouth daily. 07/03/20  Yes [provider]  clopidogrel (PLAVIX) 75 MG tablet Take 1 tablet (75 mg total) by mouth daily. 01/19/21 01/19/22 Yes [name], MD  Empagliflozin-metFORMIN HCl ER (SYNJARDY XR) 25-1000 MG TB24 Take 1 tablet by mouth daily.   Yes [provider]  ferrous sulfate 325 (65 FE) MG tablet Take 325 mg by mouth daily.   Yes [provider]  gabapentin (NEURONTIN) 800 MG tablet Take 800 mg by mouth 2 (two) times daily. 07/03/20  Yes [provider]  insulin NPH-regular Human (NOVOLIN 70/30) (70-30) 100 UNIT/ML injection Inject 12 Units into the skin 2 (two) times daily with a meal. Patient taking differently: Inject 6 Units into the skin 2 (two) times daily with a meal. 07/22/20  Yes Newlin, Enobong, MD  isosorbide-hydrALAZINE (BIDIL) 20-37.5 MG tablet Take 1 tablet by mouth 3 (three) times daily. 11/18/21  Yes [name], MD  PRESCRIPTION MEDICATION Inject 0.1-1 mLs as directed See admin instructions. Tri-Mix Standard Strength 5 ml. Formula: Prostaglandin 10 mcg/ml, Papaverine 30 mg/ml, Phentolamine 1 mg/ml. Inject 0.1 ml into side of penis as directed as needed (to be injected immediately before sexual intercourse) may increase the dose by 0.1 ml every 48 hours to achieve an erection. Max dose 1 ml.   Yes [provider]   vitamin B-12 (CYANOCOBALAMIN) 500 MCG tablet Take 500 mcg by mouth daily. 08/24/21  Yes [provider]  carvedilol (COREG) 12.5 MG tablet Take 1 tablet (12.5 mg  total) by mouth 2 (two) times daily with a meal. 10/30/21 11/29/21  Arrien, Jimmy Picket, MD  furosemide (LASIX) 40 MG tablet Take 1 tablet (40 mg total) by mouth daily. 10/31/21 11/30/21  Arrien, Jimmy Picket, MD    Current Outpatient Medications  Medication Sig Dispense Refill   aspirin EC 81 MG tablet Take 81 mg by mouth daily.     atorvastatin (LIPITOR) 40 MG tablet Take 40 mg by mouth daily.     clopidogrel (PLAVIX) 75 MG tablet Take 1 tablet (75 mg total) by mouth daily. 90 tablet 1   Empagliflozin-metFORMIN HCl ER (SYNJARDY XR) 25-1000 MG TB24 Take 1 tablet by mouth daily.     ferrous sulfate 325 (65 FE) MG tablet Take 325 mg by mouth daily.     gabapentin (NEURONTIN) 800 MG tablet Take 800 mg by mouth 2 (two) times daily.     insulin NPH-regular Human (NOVOLIN 70/30) (70-30) 100 UNIT/ML injection Inject 12 Units into the skin 2 (two) times daily with a meal. (Patient taking differently: Inject 6 Units into the skin 2 (two) times daily with a meal.) 20 mL 6   isosorbide-hydrALAZINE (BIDIL) 20-37.5 MG tablet Take 1 tablet by mouth 3 (three) times daily. 90 tablet 2   PRESCRIPTION MEDICATION Inject 0.1-1 mLs as directed See admin instructions. Tri-Mix Standard Strength 5 ml. Formula: Prostaglandin 10 mcg/ml, Papaverine 30 mg/ml, Phentolamine 1 mg/ml. Inject 0.1 ml into side of penis as directed as needed (to be injected immediately before sexual intercourse) may increase the dose by 0.1 ml every 48 hours to achieve an erection. Max dose 1 ml.     vitamin B-12 (CYANOCOBALAMIN) 500 MCG tablet Take 500 mcg by mouth daily.     carvedilol (COREG) 12.5 MG tablet Take 1 tablet (12.5 mg total) by mouth 2 (two) times daily with a meal. 60 tablet 0   furosemide (LASIX) 40 MG tablet Take 1 tablet (40 mg total) by mouth daily. 30  tablet 0   Current Facility-Administered Medications  Medication Dose Route Frequency Provider Last Rate Last Admin   sodium chloride flush (NS) 0.9 % injection 3 mL  3 mL Intravenous Q12H Lorretta Harp, MD       sodium chloride flush (NS) 0.9 % injection 3 mL  3 mL Intravenous Q12H Lorretta Harp, MD        Allergies  Allergen Reactions   Percocet [Oxycodone-Acetaminophen] Nausea And Vomiting      Review of Systems:   General:  normal appetite, + decreased energy, no weight gain, no weight loss, no fever  Cardiac:  no chest pain with exertion, no chest pain at rest, + SOB with moderate exertion, no resting SOB, no PND, no orthopnea, no palpitations, no arrhythmia, no atrial fibrillation, no LE edema, no dizzy spells, no syncope  Respiratory:  + exertional shortness of breath, no home oxygen, no productive cough, + dry cough, no bronchitis, no wheezing, no hemoptysis, no asthma, no pain with inspiration or cough, no sleep apnea, no CPAP at night  GI:   no difficulty swallowing, no reflux, no frequent heartburn, no hiatal hernia, no abdominal pain, no constipation, no diarrhea, no hematochezia, no hematemesis, no melena  GU:   no dysuria,  no frequency, no urinary tract infection, no hematuria, no enlarged prostate, no kidney stones, no kidney disease  Vascular:  no pain suggestive of claudication, + pain in feet, + leg cramps, no varicose veins, no DVT, no non-healing foot ulcer  Neuro:  no stroke, no TIA's, no seizures, no headaches, no temporary blindness one eye,  no slurred speech, + peripheral neuropathy, no chronic pain, some balance problems related to neuropathy, no memory/cognitive dysfunction  Musculoskeletal: no arthritis, no joint swelling, no myalgias, no difficulty walking, normal mobility   Skin:   no rash, no itching, no skin infections, no pressure sores or ulcerations  Psych:   no anxiety, no depression, no nervousness, no unusual recent stress  Eyes:   no blurry  vision, no floaters, no recent vision changes, + wears glasses  ENT:   no hearing loss, no loose or painful teeth, no dentures, scheduled for extraction of 3 broken back teeth by Dr. Benson Norway on 12/09/21  Hematologic:  no easy bruising, no abnormal bleeding, no clotting disorder, no frequent epistaxis  Endocrine:  + diabetes, does check CBG's at home     Physical Exam:   BP (!) 150/91 (BP Location: Left Arm, Patient Position: Sitting)   Pulse 77   Resp 18   Ht '5\' 7"'$  (1.702 m)   Wt 185 lb (83.9 kg)   SpO2 96% Comment: RA  BMI 28.98 kg/m   General:  well-appearing  HEENT:  Unremarkable, NCAT, PERLA, EOMI  Neck:   no JVD, no bruits, no adenopathy   Chest:   clear to auscultation, symmetrical breath sounds, no wheezes, no rhonchi   CV:   RRR, 3/6 systolic murmur RSB, no diastolic murmur  Abdomen:  soft, non-tender, no masses   Extremities:  warm, well-perfused, pulses not palpable at ankles, no lower extremity edema, amputation of left 4th and 5th toes.  Rectal/GU  Deferred  Neuro:   Grossly non-focal and symmetrical throughout  Skin:   Clean and dry, no rashes, no breakdown  Diagnostic Tests:    ECHOCARDIOGRAM REPORT         Patient Name:   Caleb [name] Date of Exam: 10/28/2021  Medical Rec #:  [id number]       Height:       67.0 in  Accession #:    [id number]      Weight:       184.1 lb  Date of Birth:  [birth year]        BSA:          1.952 m  Patient Age:    89 years        BP:           119/87 mmHg  Patient Gender: M               HR:           88 bpm.  Exam Location:  Inpatient   Procedure: 2D Echo, Cardiac Doppler and Color Doppler   Indications:    Congestive Heart Failure I50.9     History:        Patient has prior history of Echocardiogram examinations,  most                  recent 12/21/2020. CHF, CAD, Aortic Valve Disease; Risk                  Factors:Hypertension, Dyslipidemia and Diabetes.     Sonographer:    Ronny Flurry  Sonographer#2:  Meagan Baucom  RDCS, FE, PE  Referring Phys: [place]     1. Left ventricular ejection fraction, by estimation, is 20%. The left  ventricle has severely decreased function. The left ventricle demonstrates  global  hypokinesis. Left ventricular diastolic parameters are consistent  with Grade II diastolic  dysfunction (pseudonormalization).   2. Right ventricular systolic function is moderately reduced. The right  ventricular size is normal. Tricuspid regurgitation signal is inadequate  for assessing PA pressure.   3. Left atrial size was mildly dilated.   4. The mitral valve is degenerative. Trivial mitral valve regurgitation.  No evidence of mitral stenosis.   5. The aortic valve is abnormal. There is severe calcifcation of the  aortic valve. Aortic valve regurgitation is mild to moderate. Low flow low  gradient aortic valve stenosis, with at least moderate aortic valve  stenosis with SVI of 19 and DI 0.27.  Aortic valve mean gradient measures 12.3 mmHg. Aortic valve Vmax measures  2.38 m/s.   6. The inferior vena cava is dilated in size with <50% respiratory  variability, suggesting right atrial pressure of 15 mmHg.   Comparison(s): A prior study was performed on 12/21/20. Prior images  reviewed side by side. LVEF has decreased. Definity contrast not  performed. Consider AV calcium scoring for further evaluation of AV  stenosis.   FINDINGS   Left Ventricle: Left ventricular ejection fraction, by estimation, is  20%. The left ventricle has severely decreased function. The left  ventricle demonstrates global hypokinesis. The left ventricular internal  cavity size was normal in size. There is no  left ventricular hypertrophy. Left ventricular diastolic parameters are  consistent with Grade II diastolic dysfunction (pseudonormalization).   Right Ventricle: The right ventricular size is normal. No increase in  right ventricular wall thickness. Right ventricular  systolic function is  moderately reduced. Tricuspid regurgitation signal is inadequate for  assessing PA pressure. The tricuspid  regurgitant velocity is 1.56 m/s, and with an assumed right atrial  pressure of 15 mmHg, the estimated right ventricular systolic pressure is  09.6 mmHg.   Left Atrium: Left atrial size was mildly dilated.   Right Atrium: Right atrial size was normal in size.   Pericardium: Trivial pericardial effusion is present.   Mitral Valve: The mitral valve is degenerative in appearance. Mild to  moderate mitral annular calcification. Trivial mitral valve regurgitation.  No evidence of mitral valve stenosis. The mean mitral valve gradient is  2.0 mmHg with average heart rate of  80 bpm.   Tricuspid Valve: The tricuspid valve is normal in structure. Tricuspid  valve regurgitation is mild.   Aortic Valve: The aortic valve is abnormal. There is severe calcifcation  of the aortic valve. Aortic valve regurgitation is mild to moderate.  Moderate aortic stenosis is present. Aortic valve mean gradient measures  12.3 mmHg. Aortic valve peak gradient  measures 22.7 mmHg. Aortic valve area, by VTI measures 0.83 cm.   Pulmonic Valve: The pulmonic valve was normal in structure. Pulmonic valve  regurgitation is mild.   Aorta: The aortic root is normal in size and structure.   Venous: The inferior vena cava is dilated in size with less than 50%  respiratory variability, suggesting right atrial pressure of 15 mmHg.   IAS/Shunts: The interatrial septum was not well visualized.      LEFT VENTRICLE  PLAX 2D  LVIDd:         4.70 cm   Diastology  LVIDs:         3.70 cm   LV e' medial:    3.69 cm/s  LV PW:         1.10 cm   LV E/e' medial:  23.1  LV IVS:  1.15 cm   LV e' lateral:   6.06 cm/s  LVOT diam:     2.00 cm   LV E/e' lateral: 14.0  LV SV:         36  LV SV Index:   19  LVOT Area:     3.14 cm      RIGHT VENTRICLE  RV S prime:     7.71 cm/s  TAPSE  (M-mode): 1.1 cm   LEFT ATRIUM             Index        RIGHT ATRIUM           Index  LA diam:        4.60 cm 2.36 cm/m   RA Area:     18.40 cm  LA Vol (A2C):   73.0 ml 37.39 ml/m  RA Volume:   51.40 ml  26.33 ml/m  LA Vol (A4C):   57.1 ml 29.25 ml/m  LA Biplane Vol: 66.6 ml 34.12 ml/m   AORTIC VALVE  AV Area (Vmax):    1.00 cm  AV Area (Vmean):   0.90 cm  AV Area (VTI):     0.83 cm  AV Vmax:           238.10 cm/s  AV Vmean:          162.067 cm/s  AV VTI:            0.437 m  AV Peak Grad:      22.7 mmHg  AV Mean Grad:      12.3 mmHg  LVOT Vmax:         75.80 cm/s  LVOT Vmean:        46.400 cm/s  LVOT VTI:          0.116 m  LVOT/AV VTI ratio: 0.27     AORTA  Ao Root diam: 3.20 cm  Ao Asc diam:  3.40 cm   MITRAL VALVE               TRICUSPID VALVE  MV Area (PHT): 3.46 cm    TR Peak grad:   9.7 mmHg  MV Mean grad:  2.0 mmHg    TR Vmax:        156.00 cm/s  MV Decel Time: 219 msec  MV E velocity: 85.10 cm/s  SHUNTS  MV A velocity: 66.00 cm/s  Systemic VTI:  0.12 m  MV E/A ratio:  1.29        Systemic Diam: 2.00 cm   Cherlynn Kaiser MD  Electronically signed by Cherlynn Kaiser MD  Signature Date/Time: 10/29/2021/8:03:59 AM         Final     Physicians  Panel Physicians Referring Physician Case Authorizing Physician  Burnell Blanks, MD (Primary)     Procedures  RIGHT/LEFT HEART CATH AND CORONARY ANGIOGRAPHY   Conclusion      Prox RCA lesion is 60% stenosed.   Ramus lesion is 40% stenosed.   2nd Mrg lesion is 60% stenosed.   Mid LAD lesion is 30% stenosed.   Dist LAD lesion is 40% stenosed.   Mild non-obstructive disease in the mid and distal LAD Mild non-obstructive disease in the Circumflex and Ramus intermediate branches The RCA is a large dominant vessel with moderate proximal to mid stenosis that does not appear to be flow limiting.  Non-ischemic cardiomyopathy Severe low flow/low gradient aortic stenosis by echo  Elevated right heart  pressures.  Recommendations: I would recommend medical management of his non-obstructive CAD. He would be a candidate for TAVR or surgical AVR but given low EF, would favor TAVR. I would recommend continued diuresis today and if he feels well tomorrow, he could be discharged home and then come back for outpatient pre-TAVR CT scans. I would not plan the CT scans this weekend given his CKD. Our structural heart team will contact him to arrange the scans and the visit with our CT surgeon.    Indications  Severe aortic stenosis [I35.0 (ICD-10-CM)]  Acute on chronic systolic heart failure (HCC) [I50.23 (ICD-10-CM)]   Procedural Details  Technical Details Indication: Severe low flow/low gradient AS, NICM, acute on chronic systolic CHF  Procedure: The risks, benefits, complications, treatment options, and expected outcomes were discussed with the patient. The patient and/or family concurred with the proposed plan, giving informed consent. The patient was sedated with Versed and Fentanyl. The IV catheter in the right antecubital vein was changed for a 5 [place] sheath. Right heart catheterization performed with a balloon tipped catheter. The right wrist was prepped and draped in a sterile fashion. 1% lidocaine was used for local anesthesia. Using the modified Seldinger access technique, a 5 French sheath was placed in the right radial artery. 3 mg Verapamil was given through the sheath. Weight based IV heparin was given. Standard diagnostic catheters were used to perform selective coronary angiography. I also engaged the RCA with an AL-1 catheter for better images. I did not cross the aortic valve. All catheter exchanges were performed over an exchange length guidewire.   The sheath was removed from the right radial artery and a hemostasis band was applied at the arteriotomy site on the right wrist.      Estimated blood loss <50 mL.   During this procedure medications were administered to achieve and  maintain moderate conscious sedation while the patient's heart rate, blood pressure, and oxygen saturation were continuously monitored and I was present face-to-face 100% of this time.   Medications (Filter: Administrations occurring from 1309 to 1408 on 10/29/21) Heparin (Porcine) in NaCl 1000-0.9 UT/500ML-% SOLN (mL) Total volume:  1,000 mL  Date/Time Rate/Dose/Volume Action   10/29/21 1311 500 mL Given   1311 500 mL Given    fentaNYL (SUBLIMAZE) injection (mcg) Total dose:  50 mcg  Date/Time Rate/Dose/Volume Action   10/29/21 1324 50 mcg Given    midazolam (VERSED) injection (mg) Total dose:  2 mg  Date/Time Rate/Dose/Volume Action   10/29/21 1324 2 mg Given    lidocaine (PF) (XYLOCAINE) 1 % injection (mL) Total volume:  4 mL  Date/Time Rate/Dose/Volume Action   10/29/21 1329 2 mL Given   Canceled Entry   1336 2 mL Given    Radial Cocktail/Verapamil only (mL) Total volume:  10 mL  Date/Time Rate/Dose/Volume Action   10/29/21 1338 10 mL Given    heparin sodium (porcine) injection (Units) Total dose:  4,000 Units  Date/Time Rate/Dose/Volume Action   10/29/21 1343 4,000 Units Given    iohexol (OMNIPAQUE) 350 MG/ML injection (mL) Total volume:  55 mL  Date/Time Rate/Dose/Volume Action   10/29/21 1402 55 mL Given    aspirin EC tablet 81 mg (mg) Total dose:  Cannot be calculated* Dosing weight:  83.5  *Administration dose not documented Date/Time Rate/Dose/Volume Action   10/29/21 1309 *Not included in total MAR Hold    atorvastatin (LIPITOR) tablet 40 mg (mg) Total dose:  Cannot be calculated* Dosing weight:  83.5  *Administration dose not documented  Date/Time Rate/Dose/Volume Action   10/29/21 1309 *Not included in total MAR Hold    carvedilol (COREG) tablet 12.5 mg (mg) Total dose:  Cannot be calculated* Dosing weight:  81.7  *Administration dose not documented Date/Time Rate/Dose/Volume Action   10/29/21 1309 *Not included in total MAR Hold     clopidogrel (PLAVIX) tablet 75 mg (mg) Total dose:  Cannot be calculated* Dosing weight:  83.5  *Administration dose not documented Date/Time Rate/Dose/Volume Action   10/29/21 1309 *Not included in total MAR Hold    enoxaparin (LOVENOX) injection 40 mg (mg) Total dose:  Cannot be calculated* Dosing weight:  83.5  *Administration dose not documented Date/Time Rate/Dose/Volume Action   10/29/21 1309 *Not included in total MAR Hold    furosemide (LASIX) tablet 40 mg (mg) Total dose:  Cannot be calculated* Dosing weight:  81.7  *Administration dose not documented Date/Time Rate/Dose/Volume Action   10/29/21 1309 *Not included in total MAR Hold    gabapentin (NEURONTIN) capsule 800 mg (mg) Total dose:  Cannot be calculated* Dosing weight:  83.5  *Administration dose not documented Date/Time Rate/Dose/Volume Action   10/29/21 1309 *Not included in total MAR Hold    insulin aspart (novoLOG) injection 0-9 Units (Units) Total dose:  Cannot be calculated* Dosing weight:  83.5  *Administration dose not documented Date/Time Rate/Dose/Volume Action   10/29/21 1309 *Not included in total MAR Hold    insulin aspart protamine- aspart (NOVOLOG MIX 70/30) injection 7 Units (Units) Total dose:  Cannot be calculated* Dosing weight:  83.5  *Administration dose not documented Date/Time Rate/Dose/Volume Action   10/29/21 1309 *Not included in total MAR Hold    insulin aspart protamine- aspart (NOVOLOG MIX 70/30) injection 8 Units (Units) Total dose:  Cannot be calculated* Dosing weight:  83.5  *Administration dose not documented Date/Time Rate/Dose/Volume Action   10/29/21 1309 *Not included in total MAR Hold    lisinopril (ZESTRIL) tablet 10 mg (mg) Total dose:  Cannot be calculated* Dosing weight:  83.5  *Administration dose not documented Date/Time Rate/Dose/Volume Action   10/29/21 1309 *Not included in total MAR Hold    sodium chloride flush (NS) 0.9 % injection 3 mL  (mL) Total dose:  Cannot be calculated* Dosing weight:  81.7  *Administration dose not documented Date/Time Rate/Dose/Volume Action   10/29/21 1309 *Not included in total MAR Hold   1315 *Not included in total Automatically Held    spironolactone (ALDACTONE) tablet 12.5 mg (mg) Total dose:  Cannot be calculated* Dosing weight:  81.7  *Administration dose not documented Date/Time Rate/Dose/Volume Action   10/29/21 1309 *Not included in total MAR Hold    Sedation Time  Sedation Time Physician-1: 35 minutes 46 seconds Contrast  Medication Name Total Dose  iohexol (OMNIPAQUE) 350 MG/ML injection 55 mL   Radiation/Fluoro  Fluoro time: 12.6 (min) DAP: 41287 (mGycm2) Cumulative Air Kerma: 867 (mGy) Complications  Complications documented before study signed (10/29/2021  2:19 PM)   RIGHT/LEFT HEART CATH AND CORONARY ANGIOGRAPHY  None Documented by Darla Lesches, RN 10/29/2021  2:03 PM  Date Found: 10/29/2021  Time Range: Intraprocedure       Coronary Findings  Diagnostic Dominance: Right Left Anterior Descending  Vessel is large.  Mid LAD lesion is 30% stenosed.  Dist LAD lesion is 40% stenosed.    Ramus Intermedius  Ramus lesion is 40% stenosed.    Left Circumflex  Vessel is large.    Second Obtuse Marginal Branch  2nd Mrg lesion is 60% stenosed.    Right Coronary  Artery  Vessel is large.  Prox RCA lesion is 60% stenosed.    Intervention   No interventions have been documented.   Coronary Diagrams  Diagnostic Dominance: Right  Intervention  Implants     No implant documentation for this case.   Syngo Images   Show images for CARDIAC CATHETERIZATION Images on Long Term Storage   Show images for [place], [place] to Procedure Log  Procedure Log    Hemo Data  Flowsheet Row Most Recent Value  Fick Cardiac Output 26.28 L/min  Fick Cardiac Output Index 13.59 (L/min)/BSA  RA A Wave 12 mmHg  RA V Wave 12 mmHg  RA Mean 11 mmHg  RV  Systolic Pressure 49 mmHg  RV Diastolic Pressure 16 mmHg  RV EDP 20 mmHg  PA Systolic Pressure 50 mmHg  PA Diastolic Pressure 29 mmHg  PA Mean 38 mmHg  PW A Wave 20 mmHg  PW V Wave 20 mmHg  PW Mean 19 mmHg  AO Systolic Pressure 202 mmHg  AO Diastolic Pressure 66 mmHg  AO Mean 88 mmHg  QP/QS 1  TPVR Index 2.8 HRUI  TSVR Index 6.48 HRUI  PVR SVR Ratio 0.25  TPVR/TSVR Ratio 0.43    ADDENDUM REPORT: 11/16/2021 13:29   CLINICAL DATA:  Aortic Valve pathology with assessment for TAVR   EXAM: Cardiac TAVR CT   TECHNIQUE: The patient was scanned on a Siemens Force 542 slice scanner. A 120 kV retrospective scan was triggered in the descending thoracic aorta at 111 HU's. Gantry rotation speed was 270 msecs and collimation was .9 mm. No beta blockade or nitro were given. The 3D data set was reconstructed in 5% intervals of the R-R cycle. Systolic and diastolic phases were analyzed on a dedicated work station using MPR, MIP and VRT modes. The patient received 100 cc of contrast.   FINDINGS: Aortic Valve: Severely thickened aortic valve with heavy calcification and reduced excursion the planimeter valve area is 0.99 Sq cm consistent with severe aortic stenosis   Number of leaflets: Three- calcification of the left valve leaflet without true raphe/forme fruste pathology   LVOT calcification: Inferior LVOT calcification below the annulus in the intervalvular fibrosa   Annular calcification: Severe one calcification that protrudes into the lumen and presence of LVOT calcification   Aortic Valve Calcium Score: 2478   Presence of basal septal hypertrophy: No   Perimembranous septal diameter: 7 mm   Mitral Valve: Moderate mitral annular calcification   Aortic Annulus Measurements- 20% Phase   Major annulus diameter: 32 mm   Minor annulus diameter: 23 mm   Annular perimeter: 88 mm   Annular area: 5.74 cm2   Aortic Root Measurements   Sinotubular Junction: 29 mm; there  is annular calcification at the level of the Sinotubular junction superior to the left sinus.   Ascending Thoracic Aorta: 34 mm   Aortic Arch: 26 mm   Descending Thoracic Aorta: 26 mm   Sinus of Valsalva Measurements:   Right coronary cusp width: 32 mm   Left coronary cusp width: 34 mm   Non coronary cusp width: 34 mm   Coronary Artery Height above Annulus:   Left Main: 14 mm   Left SoV height: 22 mm   Right Coronary: 16 mm   Right SoV height: 21 mm   Optimum Fluoroscopic Angle for Delivery: LAO 2, CRA 5   Valves for structural team consideration: 29 mm Sapien Valve recommended   Suitable sinus heights and diameters for a 31  mm CoreValve, but with slight sinus asymmetry   Non TAVR Valve Findings:   Coronary Arteries: Normal coronary origin. Study not completed with nitroglycerin.   Coronary Calcium Score:   Left main: 0   Left anterior descending artery: 1100   Left circumflex artery: 94   Right coronary artery: 842   Ramus intermedius artery: 11   Total: 2037   Percentile: 98th for age, sex, and race matched control.   Small PFO   Pulmonary veins: Normal anatomy.   Main Pulmonary artery: Mild dilation 30 mm   Left atrial appendage: Delayed contrast uptake into the left atrial appendage tip, reviewed with CAC sequence thrombus appears less probably.   Extra Cardiac Findings as per separate reporting.   IMPRESSION: 1. Severe aortic stenosis. Findings pertinent to TAVR procedure are detailed above.   2. Patient's total coronary artery calcium score is 2037, which is 98th percentile for subjects of the same age, gender, and race based populations.   RECOMMENDATIONS:   The proposed cut-off value of 1,651 AU yielded a 93 % sensitivity and 75 % specificity in grading AS severity in patients with classical low-flow, low-gradient AS. Proposed different cut-off values to define severe AS for men and women as 2,065 AU and 1,274 AU, respectively.  The joint European and American recommendations for the assessment of AS consider the aortic valve calcium score as a continuum - a very high calcium score suggests severe AS and a low calcium score suggests severe AS is unlikely.   Kerman Passey, et al. 2017 ESC/EACTS Guidelines for the management of valvular heart disease. Eur Heart J [phone]   Coronary artery calcium (CAC) score is a strong predictor of incident coronary heart disease (CHD) and provides predictive information beyond traditional risk factors. CAC scoring is reasonable to use in the decision to withhold, postpone, or initiate statin therapy in intermediate-risk or selected borderline-risk asymptomatic adults (age 63-75 years and LDL-C >=70 to <190 mg/dL) who do not have diabetes or established atherosclerotic cardiovascular disease (ASCVD).* In intermediate-risk (10-year ASCVD risk >=7.5% to <20%) adults or selected borderline-risk (10-year ASCVD risk >=5% to <7.5%) adults in whom a CAC score is measured for the purpose of making a treatment decision the following recommendations have been made:   If CAC = 0, it is reasonable to withhold statin therapy and reassess in 5 to 10 years, as long as higher risk conditions are absent (diabetes mellitus, family history of premature CHD in first degree relatives (males <55 years; females <65 years), cigarette smoking, LDL >=190 mg/dL or other independent risk factors).   If CAC is 1 to 99, it is reasonable to initiate statin therapy for patients >=56 years of age.   If CAC is >=100 or >=75th percentile, it is reasonable to initiate statin therapy at any age.   Cardiology referral should be considered for patients with CAC scores >=400 or >=75th percentile.   *2018 AHA/ACC/AACVPR/AAPA/ABC/ACPM/ADA/AGS/APhA/ASPC/NLA/PCNA Guideline on the Management of Blood Cholesterol: A Report of the American College of Cardiology/American Heart Association Task  Force on Clinical Practice Guidelines. J Am Coll Cardiol. 2019;73(24):3168-3209.   Mahesh  Chandrasekhar     Electronically Signed   By: Rudean Haskell M.D.   On: 11/16/2021 13:29   Narrative & Impression  CLINICAL DATA:  Aortic valve replacement (TAVR), pre-op eval. Severe aortic valve stenosis.   EXAM: CT ANGIOGRAPHY CHEST, ABDOMEN AND PELVIS   TECHNIQUE: Multidetector CT imaging through the chest, abdomen and pelvis was performed using  the standard protocol during bolus administration of intravenous contrast. Multiplanar reconstructed images and MIPs were obtained and reviewed to evaluate the vascular anatomy.   RADIATION DOSE REDUCTION: This exam was performed according to the departmental dose-optimization program which includes automated exposure control, adjustment of the mA and/or kV according to patient size and/or use of iterative reconstruction technique.   CONTRAST:  165m OMNIPAQUE IOHEXOL 350 MG/ML SOLN   COMPARISON:  05/27/2020 CT angiogram of the chest, abdomen and pelvis. 10/28/2021 chest CT angiogram.   FINDINGS: CTA CHEST FINDINGS   Cardiovascular: Borderline mild cardiomegaly. Diffuse thickening and coarse calcification of the aortic valve. No significant pericardial effusion/thickening. Three-vessel coronary atherosclerosis. Atherosclerotic nonaneurysmal thoracic aorta. Normal caliber pulmonary arteries. No central pulmonary emboli.   Mediastinum/Nodes: No discrete thyroid nodules. Unremarkable esophagus. No pathologically enlarged axillary, mediastinal or hilar lymph nodes.   Lungs/Pleura: No pneumothorax. No pleural effusion. No acute consolidative airspace disease or lung masses. Peripheral right middle lobe 0.3 cm tiny pulmonary nodule (series 5/image 42), decreased from 0.7 cm on recent 10/28/2021 CT. No additional significant pulmonary nodules.   Musculoskeletal: No aggressive appearing focal osseous lesions. Mild thoracic  spondylosis.   CTA ABDOMEN AND PELVIS FINDINGS   Hepatobiliary: Normal liver with no liver mass. Normal gallbladder with no radiopaque cholelithiasis. No biliary ductal dilatation.   Pancreas: Normal, with no mass or duct dilation.   Spleen: Normal size. No mass.   Adrenals/Urinary Tract: Normal adrenals. No contour deforming renal masses. No hydronephrosis. Normal bladder.   Stomach/Bowel: Normal non-distended stomach. Normal caliber small bowel with no small bowel wall thickening. Normal appendix. Normal large bowel with no diverticulosis, large bowel wall thickening or pericolonic fat stranding.   Vascular/Lymphatic: Atherosclerotic nonaneurysmal abdominal aorta. No pathologically enlarged lymph nodes in the abdomen or pelvis.   Reproductive: Normal size prostate.   Other: No pneumoperitoneum, ascites or focal fluid collection.   Musculoskeletal: No aggressive appearing focal osseous lesions. Chronic trabecular thickening and mild bony expansion throughout the right greater than left hemipelvis, unchanged and compatible with Paget's disease. Mild lumbar spondylosis.   VASCULAR MEASUREMENTS PERTINENT TO TAVR:   AORTA:   Minimal Aortic Diameter-16.7 x 15.8 mm   Severity of Aortic Calcification-mild-to-moderate   RIGHT PELVIS:   Right Common Iliac Artery -   Minimal Diameter-11.5 x 9.0 mm   Tortuosity-moderate   Calcification-moderate   Right External Iliac Artery -   Minimal Diameter-8.4 x 7.6 mm   Tortuosity-moderate to severe   Calcification-mild   Right Common Femoral Artery -   Minimal Diameter-7.2 x 6.9 mm   Tortuosity-mild   Calcification-moderate   Focal high-grade stenosis noted in the proximal right superficial femoral artery (series 3/image 717). Segmental occlusion of the right deep femoral artery with distal reconstitution.   LEFT PELVIS:   Left Common Iliac Artery -   Minimal Diameter-9.6 x 9.4 mm   Tortuosity-mild-to-moderate    Calcification-marked   Left External Iliac Artery -   Minimal Diameter-8.8 x 8.4 mm   Tortuosity-mild-to-moderate   Calcification-none   Left Common Femoral Artery -   Minimal Diameter-7.5 x 7.4 mm   Tortuosity-mild   Calcification-moderate sign rib   Review of the MIP images confirms the above findings.   IMPRESSION: 1. Vascular findings and measurements pertinent to potential TAVR procedure, as detailed. 2. Diffuse thickening and coarse calcification of the aortic valve, compatible with the reported clinical history of severe aortic stenosis. 3. Three-vessel coronary atherosclerosis. 4. Tiny right middle lobe 0.3 cm pulmonary nodule, decreased in size since  recent 10/28/2021 CT, most likely a resolving benign inflammatory nodule. 5. 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 6. Aortic Atherosclerosis (ICD10-I70.0).     Electronically Signed   By: Ilona Sorrel M.D.   On: 11/16/2021 12:39    Impression:  This 71 year old gentleman has stage D2, low-flow/low gradient severe aortic stenosis with ejection fraction of 20% and NYHA class II symptoms of exertional fatigue and shortness of breath consistent with combined chronic systolic and diastolic congestive heart failure.  His ejection fraction 1 year ago was 55%.  The etiology of his drop in ejection fraction is not completely clear.  He has been seen by the heart failure team and work-up for amyloid heart disease was recommended.  Severe aortic stenosis has probably also contributed to this.  I have personally reviewed his 2D echocardiogram, cardiac catheterization, and CTA studies. His echo shows a severely calcified aortic valve with restricted leaflet mobility.  The mean gradient was only 12 mmHg with a valve area by VTI of 0.83 cm with a very low stroke-volume index of 19.  Left ventricular ejection fraction is 20%.  Cardiac catheterization shows moderate nonobstructive coronary disease with  moderately elevated right heart pressures.  I agree that aortic valve replacement is indicated in this patient for relief of his symptoms and to prevent further left ventricular dysfunction.  Given his age and multiple comorbidities I think that transcatheter aortic valve replacement would be the best option for treating him.  His gated cardiac CTA shows anatomy suitable for TAVR using a SAPIEN 3 valve.  His abdominal and pelvic CTA shows adequate pelvic vascular anatomy to allow transfemoral insertion.  The patient was counseled at length regarding treatment alternatives for management of severe symptomatic aortic stenosis. The risks and benefits of surgical intervention has been discussed in detail. Long-term prognosis with medical therapy was discussed. Alternative approaches such as conventional surgical aortic valve replacement, transcatheter aortic valve replacement, and palliative medical therapy were compared and contrasted at length. This discussion was placed in the context of the patient's own specific clinical presentation and past medical history. All of his questions have been addressed.   Following the decision to proceed with transcatheter aortic valve replacement, a discussion was held regarding what types of management strategies would be attempted intraoperatively in the event of life-threatening complications, including whether or not the patient would be considered a candidate for the use of cardiopulmonary bypass and/or conversion to open sternotomy for attempted surgical intervention.  Despite having multiple comorbidities he is still very active for his age and I think he would be a candidate for emergent sternotomy to manage any intraoperative complications.  The patient is aware of the fact that transient use of cardiopulmonary bypass may be necessary. The patient has been advised of a variety of complications that might develop including but not limited to risks of death, stroke,  paravalvular leak, aortic dissection or other major vascular complications, aortic annulus rupture, device embolization, cardiac rupture or perforation, mitral regurgitation, acute myocardial infarction, arrhythmia, heart block or bradycardia requiring permanent pacemaker placement, congestive heart failure, respiratory failure, renal failure, pneumonia, infection, other late complications related to structural valve deterioration or migration, or other complications that might ultimately cause a temporary or permanent loss of functional independence or other long term morbidity. The patient provides full informed consent for the procedure as described and all questions were answered.      Plan:  He is scheduled to have dental extractions on 12/09/2021 and will  then be scheduled for transfemoral TAVR using a SAPIEN 3 valve.  I spent 60 minutes performing this consultation and > 50% of this time was spent face to face counseling and coordinating the care of this patient's severe symptomatic aortic stenosis.   Gaye Pollack, MD 12/06/2021

## 2021-12-06 NOTE — Progress Notes (Signed)
Pre Surgical Assessment: 5 M Walk Test  12M=16.38f  5 Meter Walk Test- trial 1: 6.97 seconds 5 Meter Walk Test- trial 2: 6.87 seconds 5 Meter Walk Test- trial 3: 6.41 seconds 5 Meter Walk Test Average: 6.75 seconds

## 2021-12-08 ENCOUNTER — Other Ambulatory Visit: Payer: Self-pay

## 2021-12-08 ENCOUNTER — Encounter (HOSPITAL_COMMUNITY): Payer: Self-pay | Admitting: Dentistry

## 2021-12-08 MED ORDER — CEFAZOLIN SODIUM-DEXTROSE 2-4 GM/100ML-% IV SOLN
INTRAVENOUS | Status: AC
  Filled 2021-12-08: qty 100

## 2021-12-08 MED ORDER — CHLORHEXIDINE GLUCONATE 0.12 % MT SOLN
OROMUCOSAL | Status: AC
  Filled 2021-12-08: qty 15

## 2021-12-08 NOTE — Progress Notes (Signed)
S.D.W- Instructions   Your procedure is scheduled on Thurs., Sept. 21, 2023 from 3:16PM-4:25PM.  Report to [facility] Main Entrance "A" at 12:45 P.M., then check in with the Admitting office.  Call this number if you have problems the morning of surgery:  [phone]   Remember:  Do not eat or drink after midnight on Sept. 20th    Take these medicines the morning of surgery with A SIP OF WATER:  Atorvastatin (LIPITOR) Carvedilol (COREG) Clopidogrel (PLAVIX)  As of today, STOP taking any Aspirin (unless otherwise instructed by your surgeon) Aleve, Naproxen, Ibuprofen, Motrin, Advil, Goody's, BC's, all herbal medications, fish oil, and all vitamins.  How to Manage Your Diabetes Before and After Surgery  How do I manage my blood sugar before surgery? Check your blood sugar the morning of your surgery when you wake up and every 2 hours until you get to the Short Stay unit. If your blood sugar is less than 70 mg/dL, you will need to treat for low blood sugar: Do not take insulin. Treat a low blood sugar (less than 70 mg/dL) with  cup of clear juice (cranberry or apple), 4 glucose tablets, OR glucose gel. Recheck blood sugar in 15 minutes after treatment (to make sure it is greater than 70 mg/dL). If your blood sugar is not greater than 70 mg/dL on recheck, call [phone]  for further instructions. Report your blood sugar to the short stay nurse when you get to Short Stay.  WHAT DO I DO ABOUT MY DIABETES MEDICATION?  Do not take Empagliflozin-metFORMIN HCl ER (SYNJARDY XR) the morning of surgery.  THE NIGHT BEFORE SURGERY, take _____6______ units of _____Novolin 70/30______insulin.      THE MORNING OF SURGERY, take ______5_______ units of _____Novolin 70/30_____insulin.  If your CBG is greater than 220 mg/dL, call the number above for further instructions.  Reviewed and Endorsed by [facility] Patient Education Committee, August 2015           Do not wear jewelry. Do not  wear lotions, powders, cologne or deodorant. Do not shave 48 hours prior to surgery.  Men may shave face and neck. Do not bring valuables to the hospital.  [facility] is not responsible for any belongings or valuables.    Do NOT Smoke (Tobacco/Vaping)  24 hours prior to your procedure  If you use a CPAP at night, you may bring your mask for your overnight stay.   Contacts, glasses, hearing aids, dentures or partials may not be worn into surgery, please bring cases for these belongings   For patients admitted to the hospital, discharge time will be determined by your treatment team.   Patients discharged the day of surgery will not be allowed to drive home, and someone needs to stay with them for 24 hours.  Special instructions:    Oral Hygiene is also important to reduce your risk of infection.  Remember - BRUSH YOUR TEETH THE MORNING OF SURGERY WITH YOUR REGULAR TOOTHPASTE  - Preparing For Surgery  Before surgery, you can play an important role. Because skin is not sterile, your skin needs to be as free of germs as possible. You can reduce the number of germs on your skin by washing with Antibacterial Soap before surgery.     Please follow these instructions carefully.     Shower the NIGHT BEFORE SURGERY and the MORNING OF SURGERY with Antibacterial Soap.   Pat yourself dry with a CLEAN TOWEL.  Wear CLEAN PAJAMAS to bed the  night before surgery  Place CLEAN SHEETS on your bed the night before your surgery  DO NOT SLEEP WITH PETS.  Day of Surgery:  Take a shower with Antibacterial soap. Wear Clean/Comfortable clothing the morning of surgery Do not apply any deodorants/lotions.   Remember to brush your teeth WITH YOUR REGULAR TOOTHPASTE.   If you test positive for Covid, or been in contact with anyone that has tested positive in the last 10 days, please notify your surgeon.  SURGICAL WAITING ROOM VISITATION Patients having surgery or a procedure may have no  more than 2 support people in the waiting area - these visitors may rotate.   Children under the age of 6 must have an adult with them who is not the patient. If the patient needs to stay at the hospital during part of their recovery, the visitor guidelines for inpatient rooms apply. Pre-op nurse will coordinate an appropriate time for 1 support person to accompany patient in pre-op.  This support person may not rotate.   Please refer to the [facility] website for the visitor guidelines for Inpatients (after your surgery is over and you are in a regular room).

## 2021-12-08 NOTE — Anesthesia Preprocedure Evaluation (Addendum)
Anesthesia Evaluation  Patient identified by MRN, date of birth, ID band Patient awake    Reviewed: Allergy & Precautions, NPO status , Patient's Chart, lab work & pertinent test results  History of Anesthesia Complications Negative for: history of anesthetic complications  Airway Mallampati: III  TM Distance: >3 FB Neck ROM: Full    Dental  (+) Dental Advisory Given, Poor Dentition   Pulmonary neg pulmonary ROS,    breath sounds clear to auscultation       Cardiovascular hypertension, Pt. on medications and Pt. on home beta blockers (-) angina+ CAD, + Peripheral Vascular Disease and +CHF  + Valvular Problems/Murmurs AS  Rhythm:Regular + Systolic murmurs  Echo 2/68/34: IMPRESSIONS  1. Left ventricular ejection fraction, by estimation, is 20%. The left  ventricle has severely decreased function. The left ventricle demonstrates  global hypokinesis. Left ventricular diastolic parameters are consistent  with Grade II diastolic  dysfunction (pseudonormalization).  2. Right ventricular systolic function is moderately reduced. The right  ventricular size is normal. Tricuspid regurgitation signal is inadequate  for assessing PA pressure.  3. Left atrial size was mildly dilated.  4. The mitral valve is degenerative. Trivial mitral valve regurgitation.  No evidence of mitral stenosis.  5. The aortic valve is abnormal. There is severe calcifcation of the  aortic valve. Aortic valve regurgitation is mild to moderate. Low flow low  gradient aortic valve stenosis, with at least moderate aortic valve  stenosis with SVI of 19 and DI 0.27.  Aortic valve mean gradient measures 12.3 mmHg. Aortic valve Vmax measures  2.38 m/s.  6. The inferior vena cava is dilated in size with <50% respiratory  variability, suggesting right atrial pressure of 15 mmHg.  - Comparison(s): A prior study was performed on 12/21/20. Prior images  reviewed side  by side. LVEF has decreased. Definity contrast not  performed. Consider AV calcium scoring for further evaluation of AV  stenosis.   .  Prox RCA lesion is 60% stenosed. .  Ramus lesion is 40% stenosed. .  2nd Mrg lesion is 60% stenosed. .  Mid LAD lesion is 30% stenosed. Jorene Minors LAD lesion is 40% stenosed.  1. Mild non-obstructive disease in the mid and distal LAD 2. Mild non-obstructive disease in the Circumflex and Ramus intermediate branches 3. The RCA is a large dominant vessel with moderate proximal to mid stenosis that does not appear to be flow limiting.  4. Non-ischemic cardiomyopathy 5. Severe low flow/low gradient aortic stenosis by echo  6. Elevated right heart pressures.   Recommendations: I would recommend medical management of his non-obstructive CAD. He would be a candidate for TAVR or surgical AVR but given low EF, would favor TAVR. I would recommend continued diuresis today and if he feels well tomorrow, he could be discharged home and then come back for outpatient pre-TAVR CT scans. I would not plan the CT scans this weekend given his CKD. Our structural heart team will contact him to arrange the scans and the visit with our CT surgeon.      Neuro/Psych negative neurological ROS     GI/Hepatic GERD  ,  Endo/Other  diabetes, Type 2, Insulin Dependent  Renal/GU CRFRenal diseaseLab Results      Component                Value               Date  CREATININE               1.25                11/18/2021                Musculoskeletal negative musculoskeletal ROS (+)   Abdominal   Peds  Hematology  (+) Blood dyscrasia, , Lab Results      Component                Value               Date                      WBC                      5.5                 12/09/2021                HGB                      16.5                12/09/2021                HCT                      50.7                12/09/2021                MCV                       85.2                12/09/2021                PLT                      274                 12/09/2021            plavix   Anesthesia Other Findings ? The left ventricular ejection fraction is moderately decreased (30-44%). ? Nuclear stress EF: 38%. ? No T wave inversion was noted during stress. ? There was no ST segment deviation noted during stress. ? Defect 1: There is a medium defect of moderate severity present in the basal inferior, mid inferior and apex location. ? This is an intermediate risk study. ? Findings consistent with prior myocardial infarction.   Medium size, moderate intensity fixed inferior perfusion defect, likely scar. LVEF 38% with inferior akinesis. This is an intermediate risk study. No prior study for comparison.   Reproductive/Obstetrics                           Anesthesia Physical Anesthesia Plan  ASA: 4  Anesthesia Plan: General   Post-op Pain Management: Ofirmev IV (intra-op)*   Induction: Intravenous  PONV Risk Score and Plan: 2 and Ondansetron and Dexamethasone  Airway Management Planned: Oral ETT  Additional Equipment: ClearSight  Intra-op Plan:   Post-operative Plan: Extubation in OR  Informed Consent: I have reviewed the patients History and Physical, chart, labs and discussed the procedure including the risks,  benefits and alternatives for the proposed anesthesia with the patient or authorized representative who has indicated his/her understanding and acceptance.     Dental advisory given  Plan Discussed with: CRNA  Anesthesia Plan Comments: (See PAT note written 12/08/2021 by Myra Gianotti, PA-C. )       Anesthesia Quick Evaluation

## 2021-12-08 NOTE — Progress Notes (Signed)
Anesthesia Chart Review:  Case: [id number] Date/Time: 12/09/21 1501   Procedure: MULTIPLE EXTRACTION WITH ALVEOLOPLASTY   Anesthesia type: General   Pre-op diagnosis: dental carries   Location: [facility] OR ROOM 02 / [facility]   Surgeons: Charlaine Dalton, DMD       DISCUSSION: Patient is a 71 year old male scheduled for the above procedure.  He has low-flow, low gradient severe aortic stenosis with ejection fraction of 20% and NYHA class II symptoms.  He has moderate nonobstructive coronary artery disease on recent cardiac cath.  He is being considered for future TAVR and above procedure recommended preoperatively. He has pending NM cardiac amyloid scan.   History includes never smoker, CAD (moderate, non-obstructive 10/29/21), murmur/severe AS, chronic HFrEF, HTN, hypercholesterolemia, CKD, DM2, neuropathy, pancreatitis (08/2009, 07/2012), carotid artery disease (s/p left TCAR 12/16/19, right TCAR 02/03/20), PAD (s/p right popliteal & SFA atherectomy/angioplasty 08/08/19; PTA/stenting left popliteal artery 11/18/19). 11/16/21 CT coronary showed small PFO.   Per 11/09/21 note by Dr. Benson Norway, Plavix will not need to be stopped prior to dental surgery.   He is a same-day work-up, anesthesia team to evaluate on the day of surgery.   VS:  BP Readings from Last 3 Encounters:  12/06/21 (!) 150/91  11/18/21 (!) 158/89  11/16/21 (!) 151/86   Pulse Readings from Last 3 Encounters:  12/06/21 77  11/18/21 78  11/16/21 74     PROVIDERS: [name], MD is PCP  Quay Burow, MD is cardiologist Loralie Champagne, MD is HF cardiologist Gilford Raid, MD is CT surgeon   LABS: For day of surgery as indicated. Last results include: Lab Results  Component Value Date   WBC 6.0 10/28/2021   HGB 14.3 10/29/2021   HCT 42.0 10/29/2021   PLT 324 10/28/2021   GLUCOSE 165 (H) 11/18/2021   ALT 17 01/19/2021   AST 25 01/19/2021   NA 144 11/18/2021   K 4.0 11/18/2021   CL 107 (H) 11/18/2021   CREATININE 1.25  11/18/2021   BUN 17 11/18/2021   CO2 21 11/18/2021   TSH 2.565 10/28/2021   HGBA1C 8.0 (A) 09/29/2021     Pre Surgical Assessment: 5 M Walk Test 12/06/21: 8M=16.28f 5 Meter Walk Test- trial 1: 6.97 seconds 5 Meter Walk Test- trial 2: 6.87 seconds 5 Meter Walk Test- trial 3: 6.41 seconds 5 Meter Walk Test Average: 6.75 seconds   IMAGES: CTA Chest/abd/pelvis 11/16/21: IMPRESSION: 1. Vascular findings and measurements pertinent to potential TAVR procedure, as detailed. 2. Diffuse thickening and coarse calcification of the aortic valve, compatible with the reported clinical history of severe aortic stenosis. 3. Three-vessel coronary atherosclerosis. 4. Tiny right middle lobe 0.3 cm pulmonary nodule, decreased in size since recent 10/28/2021 CT, most likely a resolving benign inflammatory nodule. 5. 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 6. Aortic Atherosclerosis (ICD10-I70.0).   EKG: 11/12/21: Normal sinus rhythm Minimal voltage criteria for LVH, may be normal variant ( Sokolow-Lyon ) T wave abnormality, consider anterior ischemia Prolonged QT [QT 422 ms, QTc 462 ms] Abnormal ECG No significant change was found Confirmed by Camnitz, Will [phone] on 11/12/2021 9:14:07 PM   CV: CT Coronary 11/16/21: IMPRESSION: 1. Severe aortic stenosis. Findings pertinent to TAVR procedure are detailed above. 2. Patient's total coronary artery calcium score is 2037, which is 98th percentile for subjects of the same age, gender, and race based populations. - Small PFO - Pulmonary veins: Normal anatomy. - Main Pulmonary artery: Mild dilation 30 mm - Left atrial appendage: Delayed  contrast uptake into the left atrial appendage tip, reviewed with CAC sequence thrombus appears less probably.   RHC/LHC 10/29/21:   Prox RCA lesion is 60% stenosed.   Ramus lesion is 40% stenosed.   2nd Mrg lesion is 60% stenosed.   Mid LAD lesion is 30% stenosed.   Dist LAD  lesion is 40% stenosed.   Mild non-obstructive disease in the mid and distal LAD Mild non-obstructive disease in the Circumflex and Ramus intermediate branches The RCA is a large dominant vessel with moderate proximal to mid stenosis that does not appear to be flow limiting.  Non-ischemic cardiomyopathy Severe low flow/low gradient aortic stenosis by echo  Elevated right heart pressures.    Recommendations: I would recommend medical management of his non-obstructive CAD. He would be a candidate for TAVR or surgical AVR but given low EF, would favor TAVR...    Echo 10/28/21: IMPRESSIONS   1. Left ventricular ejection fraction, by estimation, is 20%. The left  ventricle has severely decreased function. The left ventricle demonstrates  global hypokinesis. Left ventricular diastolic parameters are consistent  with Grade II diastolic  dysfunction (pseudonormalization).   2. Right ventricular systolic function is moderately reduced. The right  ventricular size is normal. Tricuspid regurgitation signal is inadequate  for assessing PA pressure.   3. Left atrial size was mildly dilated.   4. The mitral valve is degenerative. Trivial mitral valve regurgitation.  No evidence of mitral stenosis.   5. The aortic valve is abnormal. There is severe calcifcation of the  aortic valve. Aortic valve regurgitation is mild to moderate. Low flow low  gradient aortic valve stenosis, with at least moderate aortic valve  stenosis with SVI of 19 and DI 0.27.  Aortic valve mean gradient measures 12.3 mmHg. Aortic valve Vmax measures  2.38 m/s.   6. The inferior vena cava is dilated in size with <50% respiratory  variability, suggesting right atrial pressure of 15 mmHg.  - Comparison(s): A prior study was performed on 12/21/20. Prior images  reviewed side by side. LVEF has decreased. Definity contrast not  performed. Consider AV calcium scoring for further evaluation of AV  stenosis.    US Carotid  12/31/20: Summary:  - Right Carotid: Patent stent with no significant stenosis noted.  - Left Carotid: Patent stent with no significant stenosis noted.  - Vertebrals:  Bilateral vertebral arteries demonstrate antegrade flow.  - Subclavians: Normal flow hemodynamics were seen in bilateral subclavian arteries.    Past Medical History:  Diagnosis Date   Aortic stenosis    Carotid artery occlusion    Diabetes mellitus without complication ([place])    Dyslipidemia 09/27/2012   Erectile dysfunction 09/27/2012   Heart murmur    Hypercholesteremia    Hyperlipidemia 08/12/2012   Hypertension    Hyponatremia 08/14/2012   Neuropathy    Pancreatitis    Pancreatitis, acute 09/27/2012   Peripheral vascular disease ([place])    Vitamin D deficiency 06/23/2015    Past Surgical History:  Procedure Laterality Date   ABDOMINAL AORTOGRAM W/LOWER EXTREMITY N/A 08/08/2019   Procedure: ABDOMINAL AORTOGRAM W/LOWER EXTREMITY;  Surgeon: Lorretta Harp, MD;  Location: [facility];  Service: Cardiovascular;  Laterality: N/A;   ABDOMINAL AORTOGRAM W/LOWER EXTREMITY N/A 11/18/2019   Procedure: ABDOMINAL AORTOGRAM W/LOWER EXTREMITY;  Surgeon: Lorretta Harp, MD;  Location: [facility];  Service: Cardiovascular;  Laterality: N/A;   COLONOSCOPY  12 years ago   in [place] clinic= normal exam per pt   KNEE  SURGERY     PERIPHERAL VASCULAR INTERVENTION Right 08/08/2019   Procedure: PERIPHERAL VASCULAR INTERVENTION;  Surgeon: Lorretta Harp, MD;  Location: [facility];  Service: Cardiovascular;  Laterality: Right;   PERIPHERAL VASCULAR INTERVENTION Left 11/18/2019   Procedure: PERIPHERAL VASCULAR INTERVENTION;  Surgeon: Lorretta Harp, MD;  Location: [facility];  Service: Cardiovascular;  Laterality: Left;  left popliteal artery   RIGHT/LEFT HEART CATH AND CORONARY ANGIOGRAPHY N/A 10/29/2021   Procedure: RIGHT/LEFT HEART CATH AND CORONARY ANGIOGRAPHY;  Surgeon: Burnell Blanks, MD;   Location: [facility];  Service: Cardiovascular;  Laterality: N/A;   TRANSCAROTID ARTERY REVASCULARIZATION  Left 12/16/2019   Procedure: TRANSCAROTID ARTERY REVASCULARIZATION;  Surgeon: Elam Dutch, MD;  Location: [facility];  Service: Vascular;  Laterality: Left;   TRANSCAROTID ARTERY REVASCULARIZATION  Right 02/03/2020   Procedure: RIGHT TRANSCAROTID ARTERY REVASCULARIZATION;  Surgeon: Elam Dutch, MD;  Location[facility];  Service: Vascular;  Laterality: Right;   ULTRASOUND GUIDANCE FOR VASCULAR ACCESS Right 12/16/2019   Procedure: ULTRASOUND GUIDANCE FOR VASCULAR ACCESS;  Surgeon: Elam Dutch, MD;  Location: [facility];  Service: Vascular;  Laterality: Right;   ULTRASOUND GUIDANCE FOR VASCULAR ACCESS Right 02/03/2020   Procedure: ULTRASOUND GUIDANCE FOR VASCULAR ACCESS;  Surgeon: Elam Dutch, MD;  Location[facility];  Service: Vascular;  Laterality: Right;   VASECTOMY      MEDICATIONS:  ceFAZolin (ANCEF) 2-4 GM/100ML-% IVPB   chlorhexidine (PERIDEX) 0.12 % solution   sodium chloride flush (NS) 0.9 % injection 3 mL   sodium chloride flush (NS) 0.9 % injection 3 mL    aspirin EC 81 MG tablet   atorvastatin (LIPITOR) 40 MG tablet   carvedilol (COREG) 12.5 MG tablet   clopidogrel (PLAVIX) 75 MG tablet   Empagliflozin-metFORMIN HCl ER (SYNJARDY XR) 25-1000 MG TB24   ferrous sulfate 325 (65 FE) MG tablet   furosemide (LASIX) 40 MG tablet   gabapentin (NEURONTIN) 800 MG tablet   insulin NPH-regular Human (NOVOLIN 70/30) (70-30) 100 UNIT/ML injection   PRESCRIPTION MEDICATION   sacubitril-valsartan (ENTRESTO) 24-26 MG   vitamin B-12 (CYANOCOBALAMIN) 500 MCG tablet   isosorbide-hydrALAZINE (BIDIL) 20-37.5 MG tablet    Myra Gianotti, PA-C Surgical Short Stay/Anesthesiology [facility] Phone [phone] [facility] Phone [phone] 12/08/2021 2:25 PM

## 2021-12-08 NOTE — Progress Notes (Signed)
PCP - Dr. Margarita Rana  Cardiologist - Dr. Gwenlyn Found  EP- Denies  Endocrine- Denies  Pulm- Denies  Chest x-ray - 10/27/21 (E)  EKG - 11/12/21 (E)  Stress Test - Denies  ECHO - 10/28/21 (E)  Cardiac Cath - 10/29/21 (E)  AICD-na PM-na LOOP-na  Nerve Stimulator- Denies  Dialysis- Denies  Sleep Study - Denies CPAP - Denies  LABS- 12/09/21: CBC 11/08/21(E): BMP  ASA- LD- 9/20 PLAVIX- Cont.  ERAS- No  HA1C- 09/29/21(E): 8.0 Fasting Blood Sugar - 70-130 Checks Blood Sugar _3-4____ times a day  Anesthesia- Yes- cardiac history  Pt denies having chest pain, sob, or fever during the pre-op phone call. All instructions explained to the pt, with a verbal understanding of the material. Pt also instructed to wear a mask and social distance if he goes out. The opportunity to ask questions was provided.

## 2021-12-09 ENCOUNTER — Ambulatory Visit (HOSPITAL_COMMUNITY)
Admission: RE | Admit: 2021-12-09 | Discharge: 2021-12-09 | Disposition: A | Discharge status: Home / Self Care | Payer: Medicare HMO | Source: Ambulatory Visit | Attending: Dentistry | Admitting: Dentistry

## 2021-12-09 ENCOUNTER — Encounter (HOSPITAL_COMMUNITY)
Admission: RE | Disposition: A | Discharge status: Home / Self Care | Payer: Self-pay | Source: Ambulatory Visit | Attending: Dentistry

## 2021-12-09 ENCOUNTER — Ambulatory Visit (HOSPITAL_COMMUNITY): Payer: Medicare HMO | Admitting: Vascular Surgery

## 2021-12-09 ENCOUNTER — Ambulatory Visit (HOSPITAL_BASED_OUTPATIENT_CLINIC_OR_DEPARTMENT_OTHER): Payer: Medicare HMO | Admitting: Vascular Surgery

## 2021-12-09 ENCOUNTER — Encounter (HOSPITAL_COMMUNITY): Payer: Self-pay | Admitting: Dentistry

## 2021-12-09 DIAGNOSIS — I428 Other cardiomyopathies: Secondary | ICD-10-CM | POA: Diagnosis not present

## 2021-12-09 DIAGNOSIS — Z794 Long term (current) use of insulin: Secondary | ICD-10-CM | POA: Insufficient documentation

## 2021-12-09 DIAGNOSIS — K036 Deposits [accretions] on teeth: Secondary | ICD-10-CM | POA: Diagnosis not present

## 2021-12-09 DIAGNOSIS — I5042 Chronic combined systolic (congestive) and diastolic (congestive) heart failure: Secondary | ICD-10-CM | POA: Insufficient documentation

## 2021-12-09 DIAGNOSIS — Z79899 Other long term (current) drug therapy: Secondary | ICD-10-CM | POA: Diagnosis not present

## 2021-12-09 DIAGNOSIS — K029 Dental caries, unspecified: Secondary | ICD-10-CM | POA: Insufficient documentation

## 2021-12-09 DIAGNOSIS — I509 Heart failure, unspecified: Secondary | ICD-10-CM | POA: Diagnosis not present

## 2021-12-09 DIAGNOSIS — I350 Nonrheumatic aortic (valve) stenosis: Secondary | ICD-10-CM | POA: Diagnosis not present

## 2021-12-09 DIAGNOSIS — K0530 Chronic periodontitis, unspecified: Secondary | ICD-10-CM | POA: Insufficient documentation

## 2021-12-09 DIAGNOSIS — N189 Chronic kidney disease, unspecified: Secondary | ICD-10-CM

## 2021-12-09 DIAGNOSIS — Z95820 Peripheral vascular angioplasty status with implants and grafts: Secondary | ICD-10-CM | POA: Diagnosis not present

## 2021-12-09 DIAGNOSIS — I2510 Atherosclerotic heart disease of native coronary artery without angina pectoris: Secondary | ICD-10-CM

## 2021-12-09 DIAGNOSIS — N183 Chronic kidney disease, stage 3 unspecified: Secondary | ICD-10-CM | POA: Diagnosis not present

## 2021-12-09 DIAGNOSIS — I130 Hypertensive heart and chronic kidney disease with heart failure and stage 1 through stage 4 chronic kidney disease, or unspecified chronic kidney disease: Secondary | ICD-10-CM

## 2021-12-09 DIAGNOSIS — E1122 Type 2 diabetes mellitus with diabetic chronic kidney disease: Secondary | ICD-10-CM | POA: Diagnosis not present

## 2021-12-09 DIAGNOSIS — K083 Retained dental root: Secondary | ICD-10-CM

## 2021-12-09 DIAGNOSIS — E7800 Pure hypercholesterolemia, unspecified: Secondary | ICD-10-CM | POA: Diagnosis not present

## 2021-12-09 DIAGNOSIS — E1140 Type 2 diabetes mellitus with diabetic neuropathy, unspecified: Secondary | ICD-10-CM | POA: Diagnosis not present

## 2021-12-09 HISTORY — PX: MULTIPLE EXTRACTIONS WITH ALVEOLOPLASTY: SHX5342

## 2021-12-09 HISTORY — DX: Gastro-esophageal reflux disease without esophagitis: K21.9

## 2021-12-09 LAB — CBC
HCT: 50.7 % (ref 39.0–52.0)
Hemoglobin: 16.5 g/dL (ref 13.0–17.0)
MCH: 27.7 pg (ref 26.0–34.0)
MCHC: 32.5 g/dL (ref 30.0–36.0)
MCV: 85.2 fL (ref 80.0–100.0)
Platelets: 274 10*3/uL (ref 150–400)
RBC: 5.95 MIL/uL — ABNORMAL HIGH (ref 4.22–5.81)
RDW: 15 % (ref 11.5–15.5)
WBC: 5.5 10*3/uL (ref 4.0–10.5)
nRBC: 0 % (ref 0.0–0.2)

## 2021-12-09 LAB — GLUCOSE, CAPILLARY
Glucose-Capillary: 120 mg/dL — ABNORMAL HIGH (ref 70–99)
Glucose-Capillary: 105 mg/dL — ABNORMAL HIGH (ref 70–99)
Glucose-Capillary: 130 mg/dL — ABNORMAL HIGH (ref 70–99)

## 2021-12-09 SURGERY — MULTIPLE EXTRACTION WITH ALVEOLOPLASTY
Anesthesia: General

## 2021-12-09 MED ORDER — CEFAZOLIN SODIUM-DEXTROSE 2-4 GM/100ML-% IV SOLN
INTRAVENOUS | Status: AC
  Filled 2021-12-09: qty 100

## 2021-12-09 MED ORDER — LIDOCAINE-EPINEPHRINE 2 %-1:100000 IJ SOLN
INTRAMUSCULAR | Status: DC | PRN
  Administered 2021-12-09 (×2): 1.7 mL via INTRADERMAL

## 2021-12-09 MED ORDER — EPHEDRINE SULFATE (PRESSORS) 50 MG/ML IJ SOLN
INTRAMUSCULAR | Status: DC | PRN
  Administered 2021-12-09: 5 mg via INTRAVENOUS

## 2021-12-09 MED ORDER — ORAL CARE MOUTH RINSE: 15.0000 mL | Freq: Once | OROMUCOSAL | Status: AC

## 2021-12-09 MED ORDER — FENTANYL CITRATE (PF) 250 MCG/5ML IJ SOLN
INTRAMUSCULAR | Status: DC | PRN
  Administered 2021-12-09 (×2): 50 ug via INTRAVENOUS

## 2021-12-09 MED ORDER — DEXAMETHASONE SODIUM PHOSPHATE 10 MG/ML IJ SOLN
INTRAMUSCULAR | Status: DC | PRN
  Administered 2021-12-09: 4 mg via INTRAVENOUS

## 2021-12-09 MED ORDER — INSULIN ASPART 100 UNIT/ML IJ SOLN: 0.0000 [IU] | INTRAMUSCULAR | Status: DC | PRN

## 2021-12-09 MED ORDER — PHENYLEPHRINE HCL (PRESSORS) 10 MG/ML IV SOLN
INTRAVENOUS | Status: DC | PRN
  Administered 2021-12-09 (×3): 80 ug via INTRAVENOUS

## 2021-12-09 MED ORDER — ETOMIDATE 2 MG/ML IV SOLN
INTRAVENOUS | Status: DC | PRN
  Administered 2021-12-09: 16 mg via INTRAVENOUS

## 2021-12-09 MED ORDER — BUPIVACAINE-EPINEPHRINE (PF) 0.5% -1:200000 IJ SOLN
INTRAMUSCULAR | Status: AC
  Filled 2021-12-09: qty 3.6

## 2021-12-09 MED ORDER — CEFAZOLIN SODIUM-DEXTROSE 2-4 GM/100ML-% IV SOLN
2.0000 g | INTRAVENOUS | Status: AC
  Administered 2021-12-09: 2 g via INTRAVENOUS

## 2021-12-09 MED ORDER — CHLORHEXIDINE GLUCONATE 0.12 % MT SOLN
OROMUCOSAL | Status: AC
  Administered 2021-12-09: 15 mL via OROMUCOSAL
  Filled 2021-12-09: qty 15

## 2021-12-09 MED ORDER — ROCURONIUM BROMIDE 100 MG/10ML IV SOLN
INTRAVENOUS | Status: DC | PRN
  Administered 2021-12-09: 60 mg via INTRAVENOUS

## 2021-12-09 MED ORDER — LIDOCAINE 2% (20 MG/ML) 5 ML SYRINGE
INTRAMUSCULAR | Status: DC | PRN
  Administered 2021-12-09: 40 mg via INTRAVENOUS

## 2021-12-09 MED ORDER — OXYMETAZOLINE HCL 0.05 % NA SOLN
NASAL | Status: AC
  Filled 2021-12-09: qty 30

## 2021-12-09 MED ORDER — LIDOCAINE-EPINEPHRINE 2 %-1:100000 IJ SOLN
INTRAMUSCULAR | Status: AC
  Filled 2021-12-09: qty 10.2

## 2021-12-09 MED ORDER — ONDANSETRON HCL 4 MG/2ML IJ SOLN
INTRAMUSCULAR | Status: DC | PRN
  Administered 2021-12-09: 4 mg via INTRAVENOUS

## 2021-12-09 MED ORDER — 0.9 % SODIUM CHLORIDE (POUR BTL) OPTIME
TOPICAL | Status: DC | PRN
  Administered 2021-12-09: 1000 mL

## 2021-12-09 MED ORDER — SUGAMMADEX SODIUM 200 MG/2ML IV SOLN
INTRAVENOUS | Status: DC | PRN
  Administered 2021-12-09: 300 mg via INTRAVENOUS

## 2021-12-09 MED ORDER — CHLORHEXIDINE GLUCONATE 0.12 % MT SOLN: 15.0000 mL | Freq: Once | OROMUCOSAL | Status: AC

## 2021-12-09 MED ORDER — LACTATED RINGERS IV SOLN
INTRAVENOUS | Status: DC
Start: 2021-12-09 — End: 2021-12-09

## 2021-12-09 MED ORDER — ESMOLOL HCL 100 MG/10ML IV SOLN
INTRAVENOUS | Status: DC | PRN
  Administered 2021-12-09: 30 mg via INTRAVENOUS

## 2021-12-09 SURGICAL SUPPLY — 34 items
ALCOHOL 70% 16 OZ (MISCELLANEOUS) ×2 IMPLANT
BAG COUNTER SPONGE SURGICOUNT (BAG) ×2 IMPLANT
BAG SPNG CNTER NS LX DISP (BAG) ×1
BLADE SURG 15 STRL LF DISP TIS (BLADE) ×2 IMPLANT
BLADE SURG 15 STRL SS (BLADE) ×1
COVER SURGICAL LIGHT HANDLE (MISCELLANEOUS) ×2 IMPLANT
GAUZE 4X4 16PLY ~~LOC~~+RFID DBL (SPONGE) ×2 IMPLANT
GAUZE PACKING FOLDED 2  STR (GAUZE/BANDAGES/DRESSINGS) ×1
GAUZE PACKING FOLDED 2 STR (GAUZE/BANDAGES/DRESSINGS) ×2 IMPLANT
GLOVE SURG ENC MOIS LTX SZ6.5 (GLOVE) ×2 IMPLANT
GLOVE SURG POLYISO LF SZ6 (GLOVE) ×2 IMPLANT
GOWN STRL REUS W/ TWL LRG LVL3 (GOWN DISPOSABLE) ×4 IMPLANT
GOWN STRL REUS W/TWL LRG LVL3 (GOWN DISPOSABLE) ×2
HEMOSTAT SURGICEL .5X2 ABSORB (HEMOSTASIS) IMPLANT
KIT BASIN OR (CUSTOM PROCEDURE TRAY) ×2 IMPLANT
KIT TURNOVER KIT B (KITS) ×2 IMPLANT
MANIFOLD NEPTUNE II (INSTRUMENTS) ×2 IMPLANT
NDL BLUNT 16X1.5 OR ONLY (NEEDLE) ×2 IMPLANT
NDL DENTAL 27 LONG (NEEDLE) ×4 IMPLANT
NEEDLE BLUNT 16X1.5 OR ONLY (NEEDLE) ×1 IMPLANT
NEEDLE DENTAL 27 LONG (NEEDLE) ×2 IMPLANT
NS IRRIG 1000ML POUR BTL (IV SOLUTION) ×2 IMPLANT
PACK EENT II TURBAN DRAPE (CUSTOM PROCEDURE TRAY) ×2 IMPLANT
PAD ARMBOARD 7.5X6 YLW CONV (MISCELLANEOUS) ×2 IMPLANT
SUCTION FRAZIER HANDLE 10FR (MISCELLANEOUS) ×1
SUCTION TUBE FRAZIER 10FR DISP (MISCELLANEOUS) ×2 IMPLANT
SUT CHROMIC 3 0 PS 2 (SUTURE) ×4 IMPLANT
SYR 50ML SLIP (SYRINGE) ×2 IMPLANT
SYR BULB IRRIG 60ML STRL (SYRINGE) ×2 IMPLANT
TOWEL GREEN STERILE FF (TOWEL DISPOSABLE) ×2 IMPLANT
TUBE CONNECTING 12X1/4 (SUCTIONS) ×2 IMPLANT
WATER STERILE IRR 1000ML POUR (IV SOLUTION) ×2 IMPLANT
WATER TABLETS ICX (MISCELLANEOUS) ×2 IMPLANT
YANKAUER SUCT BULB TIP NO VENT (SUCTIONS) ×2 IMPLANT

## 2021-12-09 NOTE — Discharge Instructions (Signed)
[facility] DEPARTMENT OF DENTAL MEDICINE Dr. Debe Coder B. Benson Norway, D.M.D. Phone: [phone] Fax: [phone]    MOUTH CARE AFTER SURGERY    FACTS: Ice used in ice bag helps keep the swelling down, and can help lessen the pain for the first 24 hours after surgery. It is easier to treat pain BEFORE it happens. Spitting disturbs the clot and may cause bleeding to start again, or to get worse. Smoking delays healing and can cause complications. Sharing prescriptions can be dangerous.  Do not take medications not recently prescribed for you. Antibiotics may stop birth control pills from working.  Use other means of birth control while on antibiotics. Warm salt water rinses after the first 24 hours will help lessen the swelling:  Use 1/2 teaspoonful of table salt per oz.of water.    DO NOT: Spit Drink through a straw It is strongly advised not to smoke, dip snuff or chew tobacco for at least 3 days. Eat sharp or crunchy foods.  Avoid the area of surgery when chewing. Stop your antibiotics before your instructions say to do so. Eat hot foods until bleeding has stopped.  If you need to, let your food cool down to room temperature.    WHAT TO EXPECT: Some swelling, especially during the first 2-3 days. Soreness or discomfort in varying degrees.  Follow your dentist's instructions about how to handle pain before it starts. Pinkish saliva or light blood in saliva, or on your pillow in the morning.  This can last around 24 hours. Bruising inside or outside the mouth.  This may not show up until 2-3 days after surgery.  Don't worry, it will go away in time. Pieces of "bone" may work themselves loose.  It's OK.  If they bother you, let us know.    WHAT TO DO IMMEDIATELY AFTER SURGERY: Bite on gauze with steady pressure for 30-45 minutes at a time.  Switch out the gauze after 30-45 minutes for clean gauze, and continue this for 1-2 hours or until bleeding  subsides. Do not chew on the gauze. Do not lie down flat.  Raise your head support especially for the first 24 hours. Apply ice to your face on the side of the surgery.  You may apply it 20 minutes on and a few minutes off.  Ice for 8-12 hours.  You may use ice up to 24 hours. Before the numbness wears off, take a pain pill as instructed. Prescription pain medication is not always required. Tylenol 500 mg every 4-6 hours for up to 3 days for pain is recommended.  Do NOT take more Acetaminophen/Tylenol if you are already taking medication that contains Acetaminophen.    SWELLING: Expect swelling for the first couple of days.  It should get better after that. If swelling increases 3 days or so after surgery, let us know as soon as possible.    FEVER: Take Tylenol every 4 hours if needed to lower your temperature, especially if it is at 100oF or higher. Drink lots of fluids. If the fever does not go away, let us know.    BREATHING: Any unusual difficulty breathing means you have to have someone bring you to the emergency room ASAP.    BLEEDING: Light oozing is expected for 24 hours or so. Prop head up with pillows. Do not spit. Do not confuse bright red fresh flowing blood with lots of saliva colored with a little bit of blood. If you notice some bleeding, place gauze  or a tea bag where it is bleeding and apply CONSTANT pressure by biting down for 1 hour.  Avoid talking during this time.  Do not remove the gauze or tea bag during this hour to "check" the bleeding. If you notice bright RED bleeding FLOWING out of particular area, and filling the floor of your mouth, put a wad of gauze on that area, bite down firmly and constantly.  Call us immediately.  If we're closed, have someone bring you to the emergency room.    ORAL HYGIENE: Brush your teeth as usual after meals and before bedtime. Use a soft toothbrush around the area of surgery. DO NOT AVOID BRUSHING.  Otherwise bacteria(germs)  will grow and may delay healing or encourage infection. Since you cannot spit, just gently rinse and let the water flow out of your mouth. DO NOT SWISH HARD.    EATING: Cool liquids are a good point to start.  Increase to soft foods as tolerated.    PRESCRIPTIONS: Follow the directions for your prescriptions exactly as written. If your doctor gave you a narcotic pain medication, do not drive, operate machinery or drink alcohol when on that medication.   Questions?   Call our office during office hours at [phone] or call the Emergency Room at [phone].

## 2021-12-09 NOTE — Anesthesia Procedure Notes (Signed)
Procedure Name: Intubation Date/Time: 12/09/2021 3:40 PM  Performed by: Reeves Dam, CRNAPre-anesthesia Checklist: Patient identified, Patient being monitored, Timeout performed, Emergency Drugs available and Suction available Patient Re-evaluated:Patient Re-evaluated prior to induction Oxygen Delivery Method: Circle system utilized Preoxygenation: Pre-oxygenation with 100% oxygen Induction Type: IV induction Ventilation: Mask ventilation without difficulty Laryngoscope Size: Glidescope and 4 (GlideGo) Grade View: Grade I Tube type: Oral Rae Tube size: 7.5 mm Number of attempts: 1 Airway Equipment and Method: Stylet Placement Confirmation: ETT inserted through vocal cords under direct vision, positive ETCO2 and breath sounds checked- equal and bilateral Secured at: 23 cm Tube secured with: Tape Dental Injury: Teeth and Oropharynx as per pre-operative assessment
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
## 2021-12-09 NOTE — Progress Notes (Signed)
Confirmed with pt's friend, Abelardo Diesel) that she will transport pt to her house and pt will stay the night at her house. Brother-Randy Moder is aware of plan and will assist with 24-hour supervision once Ms. Sharlet Salina has to return to work. OR desk, CRNA, and OR nurse made aware.   Jacqlyn Larsen, RN

## 2021-12-09 NOTE — Transfer of Care (Signed)
Immediate Anesthesia Transfer of Care Note  Patient: Caleb [name]  Procedure(s) Performed: MULTIPLE EXTRACTION  Patient Location: PACU  Anesthesia Type:General  Level of Consciousness: drowsy and patient cooperative  Airway & Oxygen Therapy: Patient Spontanous Breathing and Patient connected to face mask oxygen  Post-op Assessment: Report given to RN and Post -op Vital signs reviewed and stable  Post vital signs: Reviewed and stable  Last Vitals:  Vitals Value Taken Time  BP 135/88 12/09/21 1649  Temp 36.8 C 12/09/21 1649  Pulse 72 12/09/21 1652  Resp 15 12/09/21 1652  SpO2 95 % 12/09/21 1652  Vitals shown include unvalidated device data.  Last Pain:  Vitals:   12/09/21 1328  TempSrc:   PainSc: 0-No pain      Patients Stated Pain Goal: 3 (11/22/99 4996)  Complications: No notable events documented.

## 2021-12-09 NOTE — Progress Notes (Addendum)
STS SCORE  Procedure Type: Isolated AVR  Perioperative Outcome Estimate %  Operative Mortality 3.38%  Morbidity & Mortality 13.2%  Stroke 1.21%  Renal Failure 2.04%  Reoperation 4.46%  Prolonged Ventilation 6.69%  Deep Sternal Wound Infection 0.066%  [place] [facility] Stay (>14 days) 6.21%  [place] [facility] Stay (<6 days)* 32.7%

## 2021-12-09 NOTE — Interval H&P Note (Signed)
History and Physical Interval Note:  12/09/2021   Caleb [name]  has presented today for surgery, with the diagnosis of dental caries.  The various methods of treatment have been discussed with the patient and family. After consideration of risks, benefits and other options for treatment, the patient has consented to the procedure(s): MULTIPLE EXTRACTIONS WITH ALVEOLOPLASTY as a surgical intervention.  The patient's history has been reviewed, patient examined, no change in status, stable for surgery.  I have reviewed the patient's chart and labs.  Questions were answered to the patient's satisfaction.     -Sandi Mariscal, DMD

## 2021-12-10 ENCOUNTER — Encounter (HOSPITAL_COMMUNITY): Payer: Self-pay | Admitting: Dentistry

## 2021-12-11 ENCOUNTER — Other Ambulatory Visit: Payer: Self-pay

## 2021-12-13 NOTE — Anesthesia Postprocedure Evaluation (Signed)
Anesthesia Post Note  Patient: Caleb [name]  Procedure(s) Performed: MULTIPLE EXTRACTION     Patient location during evaluation: PACU Anesthesia Type: General Level of consciousness: awake and patient cooperative Pain management: pain level controlled Vital Signs Assessment: post-procedure vital signs reviewed and stable Respiratory status: spontaneous breathing, nonlabored ventilation and respiratory function stable Cardiovascular status: blood pressure returned to baseline and stable Postop Assessment: no apparent nausea or vomiting Anesthetic complications: no   No notable events documented.  Last Vitals:  Vitals:   12/09/21 1700 12/09/21 1713  BP: (!) 144/89 (!) 151/93  Pulse: 66 66  Resp: 13 19  Temp:  36.5 C  SpO2: 97% 97%    Last Pain:  Vitals:   12/09/21 1713  TempSrc:   PainSc: 0-No pain                 Navil Kole

## 2021-12-14 ENCOUNTER — Telehealth (HOSPITAL_COMMUNITY): Payer: Self-pay

## 2021-12-14 DIAGNOSIS — E119 Type 2 diabetes mellitus without complications: Secondary | ICD-10-CM | POA: Diagnosis not present

## 2021-12-14 DIAGNOSIS — H43812 Vitreous degeneration, left eye: Secondary | ICD-10-CM | POA: Diagnosis not present

## 2021-12-14 DIAGNOSIS — H25813 Combined forms of age-related cataract, bilateral: Secondary | ICD-10-CM | POA: Diagnosis not present

## 2021-12-16 ENCOUNTER — Encounter (HOSPITAL_COMMUNITY): Admission: RE | Admit: 2021-12-16 | Payer: Medicare HMO | Source: Ambulatory Visit

## 2021-12-16 ENCOUNTER — Encounter (HOSPITAL_COMMUNITY): Payer: Medicare HMO | Attending: Cardiology

## 2021-12-21 ENCOUNTER — Encounter: Payer: Self-pay | Admitting: Physician Assistant

## 2021-12-21 ENCOUNTER — Other Ambulatory Visit: Payer: Self-pay | Admitting: Physician Assistant

## 2021-12-21 DIAGNOSIS — I350 Nonrheumatic aortic (valve) stenosis: Secondary | ICD-10-CM

## 2021-12-24 ENCOUNTER — Encounter (HOSPITAL_COMMUNITY): Payer: Medicare HMO | Admitting: Cardiology

## 2021-12-27 ENCOUNTER — Other Ambulatory Visit (HOSPITAL_COMMUNITY): Payer: Medicare HMO

## 2021-12-27 ENCOUNTER — Other Ambulatory Visit: Payer: Self-pay | Admitting: Physician Assistant

## 2021-12-27 DIAGNOSIS — I350 Nonrheumatic aortic (valve) stenosis: Secondary | ICD-10-CM

## 2021-12-28 DIAGNOSIS — E1165 Type 2 diabetes mellitus with hyperglycemia: Secondary | ICD-10-CM | POA: Diagnosis not present

## 2021-12-29 ENCOUNTER — Encounter: Payer: Self-pay | Admitting: Physician Assistant

## 2021-12-31 ENCOUNTER — Other Ambulatory Visit (HOSPITAL_COMMUNITY): Payer: Medicare HMO

## 2022-01-05 ENCOUNTER — Other Ambulatory Visit: Payer: Self-pay

## 2022-01-05 ENCOUNTER — Telehealth: Payer: Self-pay

## 2022-01-05 DIAGNOSIS — I350 Nonrheumatic aortic (valve) stenosis: Secondary | ICD-10-CM

## 2022-01-05 NOTE — Telephone Encounter (Signed)
I contacted the pt after reviewing his pre TAVR surgery instructions.  The pt takes [place] which contains Metformin.  I advised the pt that he needs to stop this medication on Sunday and we will advise when to resume after TAVR.  Pt verbalized understanding. The pt states that he has read through the letter that was sent to him by Nell Range PA-C and he has no further questions in regards to pre surgery instructions.   I also advised the pt that we would like him to arrive in [facility] at 9:30 AM for check in on the day of surgery.

## 2022-01-06 ENCOUNTER — Other Ambulatory Visit (HOSPITAL_COMMUNITY): Payer: Self-pay

## 2022-01-07 ENCOUNTER — Telehealth: Payer: Self-pay | Admitting: Cardiovascular Disease

## 2022-01-07 ENCOUNTER — Encounter (HOSPITAL_COMMUNITY)
Admission: RE | Admit: 2022-01-07 | Discharge: 2022-01-07 | Disposition: A | Discharge status: Home / Self Care | Payer: Medicare HMO | Source: Ambulatory Visit | Attending: Cardiovascular Disease | Admitting: Cardiovascular Disease

## 2022-01-07 ENCOUNTER — Ambulatory Visit (HOSPITAL_COMMUNITY)
Admission: RE | Admit: 2022-01-07 | Discharge: 2022-01-07 | Disposition: A | Discharge status: Home / Self Care | Payer: Medicare HMO | Source: Ambulatory Visit | Attending: Physician Assistant | Admitting: Physician Assistant

## 2022-01-07 VITALS — BP 132/75 | HR 78 | Temp 98.3°F | Resp 17 | Ht 67.0 in | Wt 176.9 lb

## 2022-01-07 DIAGNOSIS — E1169 Type 2 diabetes mellitus with other specified complication: Secondary | ICD-10-CM | POA: Diagnosis not present

## 2022-01-07 DIAGNOSIS — I350 Nonrheumatic aortic (valve) stenosis: Secondary | ICD-10-CM

## 2022-01-07 DIAGNOSIS — Z01818 Encounter for other preprocedural examination: Secondary | ICD-10-CM

## 2022-01-07 DIAGNOSIS — Z1152 Encounter for screening for COVID-19: Secondary | ICD-10-CM | POA: Insufficient documentation

## 2022-01-07 LAB — SURGICAL PCR SCREEN
MRSA, PCR: NEGATIVE
Staphylococcus aureus: NEGATIVE

## 2022-01-07 LAB — URINALYSIS, ROUTINE W REFLEX MICROSCOPIC
Bilirubin Urine: NEGATIVE
Glucose, UA: >=500 mg/dL — AB
Hgb urine dipstick: NEGATIVE
Ketones, ur: NEGATIVE mg/dL
Leukocytes,Ua: NEGATIVE
Nitrite: NEGATIVE
Protein, ur: 100 mg/dL — AB
Specific Gravity, Urine: 1.017 (ref 1.005–1.030)
pH: 5 (ref 5.0–8.0)

## 2022-01-07 LAB — COMPREHENSIVE METABOLIC PANEL
ALT: 33 U/L (ref 0–44)
AST: 46 U/L — ABNORMAL HIGH (ref 15–41)
Albumin: 3.7 g/dL (ref 3.5–5.0)
Alkaline Phosphatase: 141 U/L — ABNORMAL HIGH (ref 38–126)
Anion gap: 10 (ref 5–15)
BUN: 37 mg/dL — ABNORMAL HIGH (ref 8–23)
CO2: 25 mmol/L (ref 22–32)
Calcium: 9.2 mg/dL (ref 8.9–10.3)
Chloride: 102 mmol/L (ref 98–111)
Creatinine, Ser: 1.79 mg/dL — ABNORMAL HIGH (ref 0.61–1.24)
GFR, Estimated: 40 mL/min — ABNORMAL LOW (ref >60)
Glucose, Bld: 168 mg/dL — ABNORMAL HIGH (ref 70–99)
Potassium: 5.5 mmol/L — ABNORMAL HIGH (ref 3.5–5.1)
Sodium: 137 mmol/L (ref 135–145)
Total Bilirubin: 0.7 mg/dL (ref 0.3–1.2)
Total Protein: 7.8 g/dL (ref 6.5–8.1)

## 2022-01-07 LAB — HEMOGLOBIN A1C
Hgb A1c MFr Bld: 8.2 % — ABNORMAL HIGH (ref 4.8–5.6)
Mean Plasma Glucose: 188.64 mg/dL

## 2022-01-07 LAB — CBC
HCT: 50.4 % (ref 39.0–52.0)
Hemoglobin: 16.6 g/dL (ref 13.0–17.0)
MCH: 28 pg (ref 26.0–34.0)
MCHC: 32.9 g/dL (ref 30.0–36.0)
MCV: 85.1 fL (ref 80.0–100.0)
Platelets: 269 10*3/uL (ref 150–400)
RBC: 5.92 MIL/uL — ABNORMAL HIGH (ref 4.22–5.81)
RDW: 15 % (ref 11.5–15.5)
WBC: 6.6 10*3/uL (ref 4.0–10.5)
nRBC: 0 % (ref 0.0–0.2)

## 2022-01-07 LAB — SARS CORONAVIRUS 2 (TAT 6-24 HRS): SARS Coronavirus 2: NEGATIVE

## 2022-01-07 LAB — GLUCOSE, CAPILLARY: Glucose-Capillary: 181 mg/dL — ABNORMAL HIGH (ref 70–99)

## 2022-01-07 NOTE — Telephone Encounter (Signed)
I spoke with the blood bank and advised that only 2 Units are needed in preparation for TAVR.

## 2022-01-07 NOTE — Progress Notes (Addendum)
Patient arrived for PAT lab appt, received CHG soap and instructions. All consents/blood forms have been signed and lab work drawn. PCR/COVID swab done. EKG and CXR completed. Per lab, T/S showed antibodies, PT/INR hemolyzed. Both need to be redrawn DOS. Notified Nell Range, PA-C about abnormal UA, A1C, and the T/S & PT that need to be redrawn.

## 2022-01-07 NOTE — Telephone Encounter (Signed)
Caleb [name], from the Blood bank called saying patient is schedule for surgery on 10/24 and he is crossed match for 2 units of blood.  They wanted to know if Dr. Burt Knack wanted more than 2 units of blood they would need to know before 10/24.

## 2022-01-10 MED ORDER — NOREPINEPHRINE 4 MG/250ML-% IV SOLN
0.0000 ug/min | INTRAVENOUS | Status: AC
  Administered 2022-01-11: 1 ug/min via INTRAVENOUS
  Filled 2022-01-10: qty 250

## 2022-01-10 MED ORDER — CEFAZOLIN SODIUM-DEXTROSE 2-4 GM/100ML-% IV SOLN
2.0000 g | INTRAVENOUS | Status: AC
  Administered 2022-01-11: 2 g via INTRAVENOUS
  Filled 2022-01-10: qty 100

## 2022-01-10 MED ORDER — POTASSIUM CHLORIDE 2 MEQ/ML IV SOLN
80.0000 meq | INTRAVENOUS | Status: DC
  Filled 2022-01-10 (×2): qty 40

## 2022-01-10 MED ORDER — MAGNESIUM SULFATE 50 % IJ SOLN
40.0000 meq | INTRAMUSCULAR | Status: DC
  Filled 2022-01-10 (×2): qty 9.85

## 2022-01-10 MED ORDER — HEPARIN 30,000 UNITS/1000 ML (OHS) CELLSAVER SOLUTION
Status: DC
  Filled 2022-01-10 (×2): qty 1000

## 2022-01-10 MED ORDER — DEXMEDETOMIDINE HCL IN NACL 400 MCG/100ML IV SOLN
0.1000 ug/kg/h | INTRAVENOUS | Status: AC
  Administered 2022-01-11: 80.2 ug via INTRAVENOUS
  Filled 2022-01-10 (×2): qty 100

## 2022-01-10 NOTE — H&P (Signed)
PCP is [name], MD Referring Provider is [name], MD Primary Cardiologist is Quay Burow, MD   Reason for consultation:  Severe aortic stenosis   HPI:   The patient is a 71 year old gentleman with history of diabetes with peripheral neuropathy, hypertension, hyperlipidemia, carotid artery disease status post bilateral TCAR, PAD status post previous bilateral popliteal artery stenting, stage III chronic kidney disease, nonischemic cardiomyopathy with chronic combined systolic and diastolic congestive heart failure with an LVEF of 20% who was recently admitted for acute systolic heart failure.  The patient is quite active exercising multiple days per week at the gym, walking at least a mile on a treadmill and doing a rowing machine for up to an hour as well as lifting weights.  He reports feeling well with no symptoms until he recently drove down to [place] to take his grandson to [employer] school.  He drove in a car for approximately 20 hours and felt fine until he got out of the car at home and developed sudden severe shortness of breath.  He laid down on his couch and fell asleep until the next morning when he went to the [place] and was told to come to [facility] for admission.  A 2D echocardiogram showed severe calcification of the aortic valve with a mean gradient of 12.3 mmHg and a stroke-volume index of 19 with an ejection fraction of 20%.  There was mild to moderate aortic insufficiency.  Right ventricular systolic function was moderately reduced.  There was trivial mitral regurgitation.  His prior echo in 12/2020 showed an ejection fraction of 50 to 55% with normal RV function.  The mean gradient across the aortic valve at that time was 17 mmHg.  Cardiac catheterization on 10/29/2021 showed mild nonobstructive disease in the mid and distal LAD.  There was mild nonobstructive disease in left circumflex and ramus branches.  The RCA was a large dominant vessel with moderate proximal  to mid stenosis that did not appear to be flow-limiting.  Right heart pressures were elevated with a PA pressure of 50/29 and a mean of 38.  Mean right atrial pressure was 11.  Since discharge on Lasix he has been feeling fairly well.  He has gone back to the gym and is walking on a treadmill but not using a rowing machine.  He tried walking at [employer] a couple weeks ago but after 15 minutes walking hills he had marked weakness in his legs and it took him 45 minutes to get back to his car.  He has been lifting some weights.  He denies any chest pain or pressure.  He has some exertional shortness of breath but is mild.  He denies any dizziness or syncope.  He has had no orthopnea or peripheral edema.   He is here today by himself.  He is widowed and lives alone.  He has 3 daughters who live out of town, 1 of whom is a Lexicographer.       Past Medical History:  Diagnosis Date   Aortic stenosis     Carotid artery occlusion     Diabetes mellitus without complication ([place])     Dyslipidemia 09/27/2012   Erectile dysfunction 09/27/2012   Heart murmur     Hypercholesteremia     Hyperlipidemia 08/12/2012   Hypertension     Hyponatremia 08/14/2012   Neuropathy     Pancreatitis     Pancreatitis, acute 09/27/2012   Peripheral vascular disease ([place])     Vitamin D  deficiency 06/23/2015           Past Surgical History:  Procedure Laterality Date   ABDOMINAL AORTOGRAM W/LOWER EXTREMITY N/A 08/08/2019    Procedure: ABDOMINAL AORTOGRAM W/LOWER EXTREMITY;  Surgeon: Lorretta Harp, MD;  Location: [facility];  Service: Cardiovascular;  Laterality: N/A;   ABDOMINAL AORTOGRAM W/LOWER EXTREMITY N/A 11/18/2019    Procedure: ABDOMINAL AORTOGRAM W/LOWER EXTREMITY;  Surgeon: Lorretta Harp, MD;  Location: [facility];  Service: Cardiovascular;  Laterality: N/A;   COLONOSCOPY   12 years ago    in [place] clinic= normal exam per pt   KNEE SURGERY       PERIPHERAL VASCULAR INTERVENTION Right  08/08/2019    Procedure: PERIPHERAL VASCULAR INTERVENTION;  Surgeon: Lorretta Harp, MD;  Location: [place] [facility];  Service: Cardiovascular;  Laterality: Right;   PERIPHERAL VASCULAR INTERVENTION Left 11/18/2019    Procedure: PERIPHERAL VASCULAR INTERVENTION;  Surgeon: Lorretta Harp, MD;  Location: [facility];  Service: Cardiovascular;  Laterality: Left;  left popliteal artery   RIGHT/LEFT HEART CATH AND CORONARY ANGIOGRAPHY N/A 10/29/2021    Procedure: RIGHT/LEFT HEART CATH AND CORONARY ANGIOGRAPHY;  Surgeon: Burnell Blanks, MD;  Location: [place];  Service: Cardiovascular;  Laterality: N/A;   TRANSCAROTID ARTERY REVASCULARIZATION  Left 12/16/2019    Procedure: TRANSCAROTID ARTERY REVASCULARIZATION;  Surgeon: Elam Dutch, MD;  Location: [facility] OR;  Service: Vascular;  Laterality: Left;   TRANSCAROTID ARTERY REVASCULARIZATION  Right 02/03/2020    Procedure: RIGHT TRANSCAROTID ARTERY REVASCULARIZATION;  Surgeon: Elam Dutch, MD;  Location: [facility] OR;  Service: Vascular;  Laterality: Right;   ULTRASOUND GUIDANCE FOR VASCULAR ACCESS Right 12/16/2019    Procedure: ULTRASOUND GUIDANCE FOR VASCULAR ACCESS;  Surgeon: Elam Dutch, MD;  Location: [facility] OR;  Service: Vascular;  Laterality: Right;   ULTRASOUND GUIDANCE FOR VASCULAR ACCESS Right 02/03/2020    Procedure: ULTRASOUND GUIDANCE FOR VASCULAR ACCESS;  Surgeon: Elam Dutch, MD;  Location: [facility] OR;  Service: Vascular;  Laterality: Right;   VASECTOMY               Family History  Problem Relation Age of Onset   CAD Father     Hypertension Father     Alcohol abuse Father          Cause of death   Diabetes Mother     Colon polyps Mother     CAD Brother 25        CABG   Colon cancer Neg Hx     Esophageal cancer Neg Hx     Rectal cancer Neg Hx     Stomach cancer Neg Hx        Social History         Socioeconomic History   Marital status: Single      Spouse name: Not on file   Number  of children: 3   Years of education: Not on file   Highest education level: High school graduate  Occupational History   Occupation: Caregiver      Comment: Special needs      Comment: retired  Tobacco Use   Smoking status: Never   Smokeless tobacco: Never  Vaping Use   Vaping Use: Never used  Substance and Sexual Activity   Alcohol use: Not Currently      Comment: rare   Drug use: No   Sexual activity: Not Currently  Other Topics Concern   Not on file  Social History Narrative    Widower.  3 daughters and 3 grandchildren.      Social Determinants of Health        Financial Resource Strain: Low Risk  (10/29/2021)    Overall Financial Resource Strain (CARDIA)     Difficulty of Paying Living Expenses: Not hard at all  Food Insecurity: No Food Insecurity (10/29/2021)    Hunger Vital Sign     Worried About Running Out of Food in the Last Year: Never true     Ran Out of Food in the Last Year: Never true  Transportation Needs: No Transportation Needs (10/29/2021)    PRAPARE - Armed forces logistics/support/administrative officer (Medical): No     Lack of Transportation (Non-Medical): No  Physical Activity: Sufficiently Active (12/06/2020)    Exercise Vital Sign     Days of Exercise per Week: 4 days     Minutes of Exercise per Session: 60 min  Stress: No Stress Concern Present (12/06/2020)    [place]     Feeling of Stress : Not at all  Social Connections: Moderately Isolated (12/06/2020)    Social Connection and Isolation Panel [NHANES]     Frequency of Communication with Friends and Family: More than three times a week     Frequency of Social Gatherings with Friends and Family: More than three times a week     Attends Religious Services: More than 4 times per year     Active Member of [employer] or Organizations: No     Attends Archivist Meetings: Never     Marital Status: Widowed  Intimate Partner Violence: Not At  Risk (12/06/2020)    Humiliation, Afraid, Rape, and Kick questionnaire     Fear of Current or Ex-Partner: No     Emotionally Abused: No     Physically Abused: No     Sexually Abused: No             Prior to Admission medications   Medication Sig Start Date End Date Taking? Authorizing Provider  aspirin EC 81 MG tablet Take 81 mg by mouth daily.     Yes [provider]  atorvastatin (LIPITOR) 40 MG tablet Take 40 mg by mouth daily. 07/03/20   Yes [provider]  clopidogrel (PLAVIX) 75 MG tablet Take 1 tablet (75 mg total) by mouth daily. 01/19/21 01/19/22 Yes [name], MD  Empagliflozin-metFORMIN HCl ER (SYNJARDY XR) 25-1000 MG TB24 Take 1 tablet by mouth daily.     Yes [provider]  ferrous sulfate 325 (65 FE) MG tablet Take 325 mg by mouth daily.     Yes [provider]  gabapentin (NEURONTIN) 800 MG tablet Take 800 mg by mouth 2 (two) times daily. 07/03/20   Yes [provider]  insulin NPH-regular Human (NOVOLIN 70/30) (70-30) 100 UNIT/ML injection Inject 12 Units into the skin 2 (two) times daily with a meal. Patient taking differently: Inject 6 Units into the skin 2 (two) times daily with a meal. 07/22/20   Yes Newlin, Enobong, MD  isosorbide-hydrALAZINE (BIDIL) 20-37.5 MG tablet Take 1 tablet by mouth 3 (three) times daily. 11/18/21   Yes [name], MD  PRESCRIPTION MEDICATION Inject 0.1-1 mLs as directed See admin instructions. Tri-Mix Standard Strength 5 ml. Formula: Prostaglandin 10 mcg/ml, Papaverine 30 mg/ml, Phentolamine 1 mg/ml. Inject 0.1 ml into side of penis as directed as needed (to be injected immediately before sexual  intercourse) may increase the dose by 0.1 ml every 48 hours to achieve an erection. Max dose 1 ml.     Yes [provider]  vitamin B-12 (CYANOCOBALAMIN) 500 MCG tablet Take 500 mcg by mouth daily. 08/24/21   Yes [provider]  carvedilol (COREG) 12.5 MG tablet Take 1 tablet (12.5 mg total)  by mouth 2 (two) times daily with a meal. 10/30/21 11/29/21   Arrien, Jimmy Picket, MD  furosemide (LASIX) 40 MG tablet Take 1 tablet (40 mg total) by mouth daily. 10/31/21 11/30/21   Arrien, Jimmy Picket, MD            Current Outpatient Medications  Medication Sig Dispense Refill   aspirin EC 81 MG tablet Take 81 mg by mouth daily.       atorvastatin (LIPITOR) 40 MG tablet Take 40 mg by mouth daily.       clopidogrel (PLAVIX) 75 MG tablet Take 1 tablet (75 mg total) by mouth daily. 90 tablet 1   Empagliflozin-metFORMIN HCl ER (SYNJARDY XR) 25-1000 MG TB24 Take 1 tablet by mouth daily.       ferrous sulfate 325 (65 FE) MG tablet Take 325 mg by mouth daily.       gabapentin (NEURONTIN) 800 MG tablet Take 800 mg by mouth 2 (two) times daily.       insulin NPH-regular Human (NOVOLIN 70/30) (70-30) 100 UNIT/ML injection Inject 12 Units into the skin 2 (two) times daily with a meal. (Patient taking differently: Inject 6 Units into the skin 2 (two) times daily with a meal.) 20 mL 6   isosorbide-hydrALAZINE (BIDIL) 20-37.5 MG tablet Take 1 tablet by mouth 3 (three) times daily. 90 tablet 2   PRESCRIPTION MEDICATION Inject 0.1-1 mLs as directed See admin instructions. Tri-Mix Standard Strength 5 ml. Formula: Prostaglandin 10 mcg/ml, Papaverine 30 mg/ml, Phentolamine 1 mg/ml. Inject 0.1 ml into side of penis as directed as needed (to be injected immediately before sexual intercourse) may increase the dose by 0.1 ml every 48 hours to achieve an erection. Max dose 1 ml.       vitamin B-12 (CYANOCOBALAMIN) 500 MCG tablet Take 500 mcg by mouth daily.       carvedilol (COREG) 12.5 MG tablet Take 1 tablet (12.5 mg total) by mouth 2 (two) times daily with a meal. 60 tablet 0   furosemide (LASIX) 40 MG tablet Take 1 tablet (40 mg total) by mouth daily. 30 tablet 0             Current Facility-Administered Medications  Medication Dose Route Frequency Provider Last Rate Last Admin   sodium chloride flush (NS)  0.9 % injection 3 mL  3 mL Intravenous Q12H Lorretta Harp, MD       sodium chloride flush (NS) 0.9 % injection 3 mL  3 mL Intravenous Q12H Lorretta Harp, MD              Allergies  Allergen Reactions   Percocet [Oxycodone-Acetaminophen] Nausea And Vomiting          Review of Systems:               General:                      normal appetite, + decreased energy, no weight gain, no weight loss, no fever             Cardiac:  no chest pain with exertion, no chest pain at rest, + SOB with moderate exertion, no resting SOB, no PND, no orthopnea, no palpitations, no arrhythmia, no atrial fibrillation, no LE edema, no dizzy spells, no syncope             Respiratory:                 + exertional shortness of breath, no home oxygen, no productive cough, + dry cough, no bronchitis, no wheezing, no hemoptysis, no asthma, no pain with inspiration or cough, no sleep apnea, no CPAP at night             GI:                               no difficulty swallowing, no reflux, no frequent heartburn, no hiatal hernia, no abdominal pain, no constipation, no diarrhea, no hematochezia, no hematemesis, no melena             GU:                              no dysuria,  no frequency, no urinary tract infection, no hematuria, no enlarged prostate, no kidney stones, no kidney disease             Vascular:                     no pain suggestive of claudication, + pain in feet, + leg cramps, no varicose veins, no DVT, no non-healing foot ulcer             Neuro:                         no stroke, no TIA's, no seizures, no headaches, no temporary blindness one eye,  no slurred speech, + peripheral neuropathy, no chronic pain, some balance problems related to neuropathy, no memory/cognitive dysfunction             Musculoskeletal:         no arthritis, no joint swelling, no myalgias, no difficulty walking, normal mobility              Skin:                            no rash, no itching, no  skin infections, no pressure sores or ulcerations             Psych:                         no anxiety, no depression, no nervousness, no unusual recent stress             Eyes:                           no blurry vision, no floaters, no recent vision changes, + wears glasses            ENT:                            no hearing loss, no loose or painful teeth, no dentures, scheduled for extraction of 3 broken back teeth by Dr. Benson Norway on 12/09/21  Hematologic:               no easy bruising, no abnormal bleeding, no clotting disorder, no frequent epistaxis             Endocrine:                   + diabetes, does check CBG's at home                            Physical Exam:               BP (!) 150/91 (BP Location: Left Arm, Patient Position: Sitting)   Pulse 77   Resp 18   Ht '5\' 7"'$  (1.702 m)   Wt 185 lb (83.9 kg)   SpO2 96% Comment: RA  BMI 28.98 kg/m              General:                      well-appearing             HEENT:                       Unremarkable, NCAT, PERLA, EOMI             Neck:                           no JVD, no bruits, no adenopathy              Chest:                          clear to auscultation, symmetrical breath sounds, no wheezes, no rhonchi              CV:                              RRR, 3/6 systolic murmur RSB, no diastolic murmur             Abdomen:                    soft, non-tender, no masses              Extremities:                 warm, well-perfused, pulses not palpable at ankles, no lower extremity edema, amputation of left 4th and 5th toes.             Rectal/GU                   Deferred             Neuro:                         Grossly non-focal and symmetrical throughout             Skin:                            Clean and dry, no rashes, no breakdown   Diagnostic Tests:     ECHOCARDIOGRAM REPORT         Patient Name:   Caleb [name]  Date of Exam: 10/28/2021  Medical Rec #:  [id number]       Height:       67.0 in   Accession #:    [id number]      Weight:       184.1 lb  Date of Birth:  16-Dec-1950        BSA:          1.952 m  Patient Age:    18 years        BP:           119/87 mmHg  Patient Gender: M               HR:           88 bpm.  Exam Location:  Inpatient   Procedure: 2D Echo, Cardiac Doppler and Color Doppler   Indications:    Congestive Heart Failure I50.9     History:        Patient has prior history of Echocardiogram examinations,  most                  recent 12/21/2020. CHF, CAD, Aortic Valve Disease; Risk                  Factors:Hypertension, Dyslipidemia and Diabetes.     Sonographer:    Ronny Flurry  Sonographer#2:  Meagan Baucom RDCS, FE, PE  Referring Phys: [place]     1. Left ventricular ejection fraction, by estimation, is 20%. The left  ventricle has severely decreased function. The left ventricle demonstrates  global hypokinesis. Left ventricular diastolic parameters are consistent  with Grade II diastolic  dysfunction (pseudonormalization).   2. Right ventricular systolic function is moderately reduced. The right  ventricular size is normal. Tricuspid regurgitation signal is inadequate  for assessing PA pressure.   3. Left atrial size was mildly dilated.   4. The mitral valve is degenerative. Trivial mitral valve regurgitation.  No evidence of mitral stenosis.   5. The aortic valve is abnormal. There is severe calcifcation of the  aortic valve. Aortic valve regurgitation is mild to moderate. Low flow low  gradient aortic valve stenosis, with at least moderate aortic valve  stenosis with SVI of 19 and DI 0.27.  Aortic valve mean gradient measures 12.3 mmHg. Aortic valve Vmax measures  2.38 m/s.   6. The inferior vena cava is dilated in size with <50% respiratory  variability, suggesting right atrial pressure of 15 mmHg.   Comparison(s): A prior study was performed on 12/21/20. Prior images  reviewed side by side. LVEF has  decreased. Definity contrast not  performed. Consider AV calcium scoring for further evaluation of AV  stenosis.   FINDINGS   Left Ventricle: Left ventricular ejection fraction, by estimation, is  20%. The left ventricle has severely decreased function. The left  ventricle demonstrates global hypokinesis. The left ventricular internal  cavity size was normal in size. There is no  left ventricular hypertrophy. Left ventricular diastolic parameters are  consistent with Grade II diastolic dysfunction (pseudonormalization).   Right Ventricle: The right ventricular size is normal. No increase in  right ventricular wall thickness. Right ventricular systolic function is  moderately reduced. Tricuspid regurgitation signal is inadequate for  assessing PA pressure. The tricuspid  regurgitant velocity is 1.56 m/s, and with an assumed right atrial  pressure of 15 mmHg, the estimated right ventricular systolic pressure is  26.8 mmHg.   Left Atrium: Left  atrial size was mildly dilated.   Right Atrium: Right atrial size was normal in size.   Pericardium: Trivial pericardial effusion is present.   Mitral Valve: The mitral valve is degenerative in appearance. Mild to  moderate mitral annular calcification. Trivial mitral valve regurgitation.  No evidence of mitral valve stenosis. The mean mitral valve gradient is  2.0 mmHg with average heart rate of  80 bpm.   Tricuspid Valve: The tricuspid valve is normal in structure. Tricuspid  valve regurgitation is mild.   Aortic Valve: The aortic valve is abnormal. There is severe calcifcation  of the aortic valve. Aortic valve regurgitation is mild to moderate.  Moderate aortic stenosis is present. Aortic valve mean gradient measures  12.3 mmHg. Aortic valve peak gradient  measures 22.7 mmHg. Aortic valve area, by VTI measures 0.83 cm.   Pulmonic Valve: The pulmonic valve was normal in structure. Pulmonic valve  regurgitation is mild.   Aorta: The  aortic root is normal in size and structure.   Venous: The inferior vena cava is dilated in size with less than 50%  respiratory variability, suggesting right atrial pressure of 15 mmHg.   IAS/Shunts: The interatrial septum was not well visualized.      LEFT VENTRICLE  PLAX 2D  LVIDd:         4.70 cm   Diastology  LVIDs:         3.70 cm   LV e' medial:    3.69 cm/s  LV PW:         1.10 cm   LV E/e' medial:  23.1  LV IVS:        1.15 cm   LV e' lateral:   6.06 cm/s  LVOT diam:     2.00 cm   LV E/e' lateral: 14.0  LV SV:         36  LV SV Index:   19  LVOT Area:     3.14 cm      RIGHT VENTRICLE  RV S prime:     7.71 cm/s  TAPSE (M-mode): 1.1 cm   LEFT ATRIUM             Index        RIGHT ATRIUM           Index  LA diam:        4.60 cm 2.36 cm/m   RA Area:     18.40 cm  LA Vol (A2C):   73.0 ml 37.39 ml/m  RA Volume:   51.40 ml  26.33 ml/m  LA Vol (A4C):   57.1 ml 29.25 ml/m  LA Biplane Vol: 66.6 ml 34.12 ml/m   AORTIC VALVE  AV Area (Vmax):    1.00 cm  AV Area (Vmean):   0.90 cm  AV Area (VTI):     0.83 cm  AV Vmax:           238.10 cm/s  AV Vmean:          162.067 cm/s  AV VTI:            0.437 m  AV Peak Grad:      22.7 mmHg  AV Mean Grad:      12.3 mmHg  LVOT Vmax:         75.80 cm/s  LVOT Vmean:        46.400 cm/s  LVOT VTI:          0.116 m  LVOT/AV VTI ratio:  0.27     AORTA  Ao Root diam: 3.20 cm  Ao Asc diam:  3.40 cm   MITRAL VALVE               TRICUSPID VALVE  MV Area (PHT): 3.46 cm    TR Peak grad:   9.7 mmHg  MV Mean grad:  2.0 mmHg    TR Vmax:        156.00 cm/s  MV Decel Time: 219 msec  MV E velocity: 85.10 cm/s  SHUNTS  MV A velocity: 66.00 cm/s  Systemic VTI:  0.12 m  MV E/A ratio:  1.29        Systemic Diam: 2.00 cm   Cherlynn Kaiser MD  Electronically signed by Cherlynn Kaiser MD  Signature Date/Time: 10/29/2021/8:03:59 AM         Final       Physicians   Panel Physicians Referring Physician Case Authorizing Physician   Burnell Blanks, MD (Primary)        Procedures   RIGHT/LEFT HEART CATH AND CORONARY ANGIOGRAPHY    Conclusion       Prox RCA lesion is 60% stenosed.   Ramus lesion is 40% stenosed.   2nd Mrg lesion is 60% stenosed.   Mid LAD lesion is 30% stenosed.   Dist LAD lesion is 40% stenosed.   Mild non-obstructive disease in the mid and distal LAD Mild non-obstructive disease in the Circumflex and Ramus intermediate branches The RCA is a large dominant vessel with moderate proximal to mid stenosis that does not appear to be flow limiting.  Non-ischemic cardiomyopathy Severe low flow/low gradient aortic stenosis by echo  Elevated right heart pressures.    Recommendations: I would recommend medical management of his non-obstructive CAD. He would be a candidate for TAVR or surgical AVR but given low EF, would favor TAVR. I would recommend continued diuresis today and if he feels well tomorrow, he could be discharged home and then come back for outpatient pre-TAVR CT scans. I would not plan the CT scans this weekend given his CKD. Our structural heart team will contact him to arrange the scans and the visit with our CT surgeon.    Indications   Severe aortic stenosis [I35.0 (ICD-10-CM)]  Acute on chronic systolic heart failure (HCC) [I50.23 (ICD-10-CM)]    Procedural Details   Technical Details Indication: Severe low flow/low gradient AS, NICM, acute on chronic systolic CHF  Procedure: The risks, benefits, complications, treatment options, and expected outcomes were discussed with the patient. The patient and/or family concurred with the proposed plan, giving informed consent. The patient was sedated with Versed and Fentanyl. The IV catheter in the right antecubital vein was changed for a 5 [place] sheath. Right heart catheterization performed with a balloon tipped catheter. The right wrist was prepped and draped in a sterile fashion. 1% lidocaine was used for local anesthesia. Using  the modified Seldinger access technique, a 5 French sheath was placed in the right radial artery. 3 mg Verapamil was given through the sheath. Weight based IV heparin was given. Standard diagnostic catheters were used to perform selective coronary angiography. I also engaged the RCA with an AL-1 catheter for better images. I did not cross the aortic valve. All catheter exchanges were performed over an exchange length guidewire.   The sheath was removed from the right radial artery and a hemostasis band was applied at the arteriotomy site on the right wrist.      Estimated blood loss <50  mL.   During this procedure medications were administered to achieve and maintain moderate conscious sedation while the patient's heart rate, blood pressure, and oxygen saturation were continuously monitored and I was present face-to-face 100% of this time.    Medications (Filter: Administrations occurring from 1309 to 1408 on 10/29/21) Heparin (Porcine) in NaCl 1000-0.9 UT/500ML-% SOLN (mL) Total volume:  1,000 mL  Date/Time Rate/Dose/Volume Action    10/29/21 1311 500 mL Given    1311 500 mL Given      fentaNYL (SUBLIMAZE) injection (mcg) Total dose:  50 mcg  Date/Time Rate/Dose/Volume Action    10/29/21 1324 50 mcg Given      midazolam (VERSED) injection (mg) Total dose:  2 mg  Date/Time Rate/Dose/Volume Action    10/29/21 1324 2 mg Given      lidocaine (PF) (XYLOCAINE) 1 % injection (mL) Total volume:  4 mL  Date/Time Rate/Dose/Volume Action    10/29/21 1329 2 mL Given    Canceled Entry    1336 2 mL Given      Radial Cocktail/Verapamil only (mL) Total volume:  10 mL  Date/Time Rate/Dose/Volume Action    10/29/21 1338 10 mL Given      heparin sodium (porcine) injection (Units) Total dose:  4,000 Units  Date/Time Rate/Dose/Volume Action    10/29/21 1343 4,000 Units Given      iohexol (OMNIPAQUE) 350 MG/ML injection (mL) Total volume:  55 mL  Date/Time Rate/Dose/Volume Action     10/29/21 1402 55 mL Given      aspirin EC tablet 81 mg (mg) Total dose:  Cannot be calculated* Dosing weight:  83.5  *Administration dose not documented Date/Time Rate/Dose/Volume Action    10/29/21 1309 *Not included in total MAR Hold      atorvastatin (LIPITOR) tablet 40 mg (mg) Total dose:  Cannot be calculated* Dosing weight:  83.5  *Administration dose not documented Date/Time Rate/Dose/Volume Action    10/29/21 1309 *Not included in total MAR Hold      carvedilol (COREG) tablet 12.5 mg (mg) Total dose:  Cannot be calculated* Dosing weight:  81.7  *Administration dose not documented Date/Time Rate/Dose/Volume Action    10/29/21 1309 *Not included in total MAR Hold      clopidogrel (PLAVIX) tablet 75 mg (mg) Total dose:  Cannot be calculated* Dosing weight:  83.5  *Administration dose not documented Date/Time Rate/Dose/Volume Action    10/29/21 1309 *Not included in total MAR Hold      enoxaparin (LOVENOX) injection 40 mg (mg) Total dose:  Cannot be calculated* Dosing weight:  83.5  *Administration dose not documented Date/Time Rate/Dose/Volume Action    10/29/21 1309 *Not included in total MAR Hold      furosemide (LASIX) tablet 40 mg (mg) Total dose:  Cannot be calculated* Dosing weight:  81.7  *Administration dose not documented Date/Time Rate/Dose/Volume Action    10/29/21 1309 *Not included in total MAR Hold      gabapentin (NEURONTIN) capsule 800 mg (mg) Total dose:  Cannot be calculated* Dosing weight:  83.5  *Administration dose not documented Date/Time Rate/Dose/Volume Action    10/29/21 1309 *Not included in total MAR Hold      insulin aspart (novoLOG) injection 0-9 Units (Units) Total dose:  Cannot be calculated* Dosing weight:  83.5  *Administration dose not documented Date/Time Rate/Dose/Volume Action    10/29/21 1309 *Not included in total MAR Hold      insulin aspart protamine- aspart (NOVOLOG MIX 70/30) injection 7 Units (Units) Total  dose:  Cannot  be calculated* Dosing weight:  83.5  *Administration dose not documented Date/Time Rate/Dose/Volume Action    10/29/21 1309 *Not included in total MAR Hold      insulin aspart protamine- aspart (NOVOLOG MIX 70/30) injection 8 Units (Units) Total dose:  Cannot be calculated* Dosing weight:  83.5  *Administration dose not documented Date/Time Rate/Dose/Volume Action    10/29/21 1309 *Not included in total MAR Hold      lisinopril (ZESTRIL) tablet 10 mg (mg) Total dose:  Cannot be calculated* Dosing weight:  83.5  *Administration dose not documented Date/Time Rate/Dose/Volume Action    10/29/21 1309 *Not included in total MAR Hold      sodium chloride flush (NS) 0.9 % injection 3 mL (mL) Total dose:  Cannot be calculated* Dosing weight:  81.7  *Administration dose not documented Date/Time Rate/Dose/Volume Action    10/29/21 1309 *Not included in total MAR Hold    1315 *Not included in total Automatically Held      spironolactone (ALDACTONE) tablet 12.5 mg (mg) Total dose:  Cannot be calculated* Dosing weight:  81.7  *Administration dose not documented Date/Time Rate/Dose/Volume Action    10/29/21 1309 *Not included in total MAR Hold      Sedation Time   Sedation Time Physician-1: 35 minutes 46 seconds Contrast   Medication Name Total Dose  iohexol (OMNIPAQUE) 350 MG/ML injection 55 mL    Radiation/Fluoro   Fluoro time: 12.6 (min) DAP: 88416 (mGycm2) Cumulative Air Kerma: 606 (mGy) Complications      Complications documented before study signed (10/29/2021  2:19 PM)     RIGHT/LEFT HEART CATH AND CORONARY ANGIOGRAPHY   None Documented by Darla Lesches, RN 10/29/2021  2:03 PM  Date Found: 10/29/2021  Time Range: Intraprocedure          Coronary Findings   Diagnostic Dominance: Right Left Anterior Descending  Vessel is large.  Mid LAD lesion is 30% stenosed.  Dist LAD lesion is 40% stenosed.    Ramus Intermedius  Ramus lesion is 40%  stenosed.    Left Circumflex  Vessel is large.    Second Obtuse Marginal Branch  2nd Mrg lesion is 60% stenosed.    Right Coronary Artery  Vessel is large.  Prox RCA lesion is 60% stenosed.    Intervention    No interventions have been documented.    Coronary Diagrams   Diagnostic Dominance: Right  Intervention   Implants      No implant documentation for this case.    Syng[employer] Images    Show images for CARDIAC CATHETERIZATION Images on Long Term Storage    Show images for Liborio, Saccente to Procedure Log   Procedure Log    Hemo Data   Flowsheet Row Most Recent Value  Fick Cardiac Output 26.28 L/min  Fick Cardiac Output Index 13.59 (L/min)/BSA  RA A Wave 12 mmHg  RA V Wave 12 mmHg  RA Mean 11 mmHg  RV Systolic Pressure 49 mmHg  RV Diastolic Pressure 16 mmHg  RV EDP 20 mmHg  PA Systolic Pressure 50 mmHg  PA Diastolic Pressure 29 mmHg  PA Mean 38 mmHg  PW A Wave 20 mmHg  PW V Wave 20 mmHg  PW Mean 19 mmHg  AO Systolic Pressure 301 mmHg  AO Diastolic Pressure 66 mmHg  AO Mean 88 mmHg  QP/QS 1  TPVR Index 2.8 HRUI  TSVR Index 6.48 HRUI  PVR SVR Ratio 0.25  TPVR/TSVR Ratio 0.43      ADDENDUM REPORT:  11/16/2021 13:29   CLINICAL DATA:  Aortic Valve pathology with assessment for TAVR   EXAM: Cardiac TAVR CT   TECHNIQUE: The patient was scanned on a Siemens Force 401 slice scanner. A 120 kV retrospective scan was triggered in the descending thoracic aorta at 111 HU's. Gantry rotation speed was 270 msecs and collimation was .9 mm. No beta blockade or nitro were given. The 3D data set was reconstructed in 5% intervals of the R-R cycle. Systolic and diastolic phases were analyzed on a dedicated work station using MPR, MIP and VRT modes. The patient received 100 cc of contrast.   FINDINGS: Aortic Valve: Severely thickened aortic valve with heavy calcification and reduced excursion the planimeter valve area is 0.99 Sq cm consistent with severe  aortic stenosis   Number of leaflets: Three- calcification of the left valve leaflet without true raphe/forme fruste pathology   LVOT calcification: Inferior LVOT calcification below the annulus in the intervalvular fibrosa   Annular calcification: Severe one calcification that protrudes into the lumen and presence of LVOT calcification   Aortic Valve Calcium Score: 2478   Presence of basal septal hypertrophy: No   Perimembranous septal diameter: 7 mm   Mitral Valve: Moderate mitral annular calcification   Aortic Annulus Measurements- 20% Phase   Major annulus diameter: 32 mm   Minor annulus diameter: 23 mm   Annular perimeter: 88 mm   Annular area: 5.74 cm2   Aortic Root Measurements   Sinotubular Junction: 29 mm; there is annular calcification at the level of the Sinotubular junction superior to the left sinus.   Ascending Thoracic Aorta: 34 mm   Aortic Arch: 26 mm   Descending Thoracic Aorta: 26 mm   Sinus of Valsalva Measurements:   Right coronary cusp width: 32 mm   Left coronary cusp width: 34 mm   Non coronary cusp width: 34 mm   Coronary Artery Height above Annulus:   Left Main: 14 mm   Left SoV height: 22 mm   Right Coronary: 16 mm   Right SoV height: 21 mm   Optimum Fluoroscopic Angle for Delivery: LAO 2, CRA 5   Valves for structural team consideration: 29 mm Sapien Valve recommended   Suitable sinus heights and diameters for a 31 mm CoreValve, but with slight sinus asymmetry   Non TAVR Valve Findings:   Coronary Arteries: Normal coronary origin. Study not completed with nitroglycerin.   Coronary Calcium Score:   Left main: 0   Left anterior descending artery: 1100   Left circumflex artery: 94   Right coronary artery: 842   Ramus intermedius artery: 11   Total: 2037   Percentile: 98th for age, sex, and race matched control.   Small PFO   Pulmonary veins: Normal anatomy.   Main Pulmonary artery: Mild dilation 30 mm    Left atrial appendage: Delayed contrast uptake into the left atrial appendage tip, reviewed with CAC sequence thrombus appears less probably.   Extra Cardiac Findings as per separate reporting.   IMPRESSION: 1. Severe aortic stenosis. Findings pertinent to TAVR procedure are detailed above.   2. Patient's total coronary artery calcium score is 2037, which is 98th percentile for subjects of the same age, gender, and race based populations.   RECOMMENDATIONS:   The proposed cut-off value of 1,651 AU yielded a 93 % sensitivity and 75 % specificity in grading AS severity in patients with classical low-flow, low-gradient AS. Proposed different cut-off values to define severe AS for men and women as 2,065  AU and 1,274 AU, respectively. The joint European and American recommendations for the assessment of AS consider the aortic valve calcium score as a continuum - a very high calcium score suggests severe AS and a low calcium score suggests severe AS is unlikely.   Kerman Passey, et al. 2017 ESC/EACTS Guidelines for the management of valvular heart disease. Eur Heart J [phone]   Coronary artery calcium (CAC) score is a strong predictor of incident coronary heart disease (CHD) and provides predictive information beyond traditional risk factors. CAC scoring is reasonable to use in the decision to withhold, postpone, or initiate statin therapy in intermediate-risk or selected borderline-risk asymptomatic adults (age 35-75 years and LDL-C >=70 to <190 mg/dL) who do not have diabetes or established atherosclerotic cardiovascular disease (ASCVD).* In intermediate-risk (10-year ASCVD risk >=7.5% to <20%) adults or selected borderline-risk (10-year ASCVD risk >=5% to <7.5%) adults in whom a CAC score is measured for the purpose of making a treatment decision the following recommendations have been made:   If CAC = 0, it is reasonable to withhold statin therapy and  reassess in 5 to 10 years, as long as higher risk conditions are absent (diabetes mellitus, family history of premature CHD in first degree relatives (males <55 years; females <65 years), cigarette smoking, LDL >=190 mg/dL or other independent risk factors).   If CAC is 1 to 99, it is reasonable to initiate statin therapy for patients >=67 years of age.   If CAC is >=100 or >=75th percentile, it is reasonable to initiate statin therapy at any age.   Cardiology referral should be considered for patients with CAC scores >=400 or >=75th percentile.   *2018 AHA/ACC/AACVPR/AAPA/ABC/ACPM/ADA/AGS/APhA/ASPC/NLA/PCNA Guideline on the Management of Blood Cholesterol: A Report of the American College of Cardiology/American Heart Association Task Force on Clinical Practice Guidelines. J Am Coll Cardiol. 2019;73(24):3168-3209.   Mahesh  Chandrasekhar     Electronically Signed   By: Rudean Haskell M.D.   On: 11/16/2021 13:29   Narrative & Impression  CLINICAL DATA:  Aortic valve replacement (TAVR), pre-op eval. Severe aortic valve stenosis.   EXAM: CT ANGIOGRAPHY CHEST, ABDOMEN AND PELVIS   TECHNIQUE: Multidetector CT imaging through the chest, abdomen and pelvis was performed using the standard protocol during bolus administration of intravenous contrast. Multiplanar reconstructed images and MIPs were obtained and reviewed to evaluate the vascular anatomy.   RADIATION DOSE REDUCTION: This exam was performed according to the departmental dose-optimization program which includes automated exposure control, adjustment of the mA and/or kV according to patient size and/or use of iterative reconstruction technique.   CONTRAST:  184m OMNIPAQUE IOHEXOL 350 MG/ML SOLN   COMPARISON:  05/27/2020 CT angiogram of the chest, abdomen and pelvis. 10/28/2021 chest CT angiogram.   FINDINGS: CTA CHEST FINDINGS   Cardiovascular: Borderline mild cardiomegaly. Diffuse thickening  and coarse calcification of the aortic valve. No significant pericardial effusion/thickening. Three-vessel coronary atherosclerosis. Atherosclerotic nonaneurysmal thoracic aorta. Normal caliber pulmonary arteries. No central pulmonary emboli.   Mediastinum/Nodes: No discrete thyroid nodules. Unremarkable esophagus. No pathologically enlarged axillary, mediastinal or hilar lymph nodes.   Lungs/Pleura: No pneumothorax. No pleural effusion. No acute consolidative airspace disease or lung masses. Peripheral right middle lobe 0.3 cm tiny pulmonary nodule (series 5/image 42), decreased from 0.7 cm on recent 10/28/2021 CT. No additional significant pulmonary nodules.   Musculoskeletal: No aggressive appearing focal osseous lesions. Mild thoracic spondylosis.   CTA ABDOMEN AND PELVIS FINDINGS   Hepatobiliary: Normal liver with no liver  mass. Normal gallbladder with no radiopaque cholelithiasis. No biliary ductal dilatation.   Pancreas: Normal, with no mass or duct dilation.   Spleen: Normal size. No mass.   Adrenals/Urinary Tract: Normal adrenals. No contour deforming renal masses. No hydronephrosis. Normal bladder.   Stomach/Bowel: Normal non-distended stomach. Normal caliber small bowel with no small bowel wall thickening. Normal appendix. Normal large bowel with no diverticulosis, large bowel wall thickening or pericolonic fat stranding.   Vascular/Lymphatic: Atherosclerotic nonaneurysmal abdominal aorta. No pathologically enlarged lymph nodes in the abdomen or pelvis.   Reproductive: Normal size prostate.   Other: No pneumoperitoneum, ascites or focal fluid collection.   Musculoskeletal: No aggressive appearing focal osseous lesions. Chronic trabecular thickening and mild bony expansion throughout the right greater than left hemipelvis, unchanged and compatible with Paget's disease. Mild lumbar spondylosis.   VASCULAR MEASUREMENTS PERTINENT TO TAVR:   AORTA:   Minimal  Aortic Diameter-16.7 x 15.8 mm   Severity of Aortic Calcification-mild-to-moderate   RIGHT PELVIS:   Right Common Iliac Artery -   Minimal Diameter-11.5 x 9.0 mm   Tortuosity-moderate   Calcification-moderate   Right External Iliac Artery -   Minimal Diameter-8.4 x 7.6 mm   Tortuosity-moderate to severe   Calcification-mild   Right Common Femoral Artery -   Minimal Diameter-7.2 x 6.9 mm   Tortuosity-mild   Calcification-moderate   Focal high-grade stenosis noted in the proximal right superficial femoral artery (series 3/image 717). Segmental occlusion of the right deep femoral artery with distal reconstitution.   LEFT PELVIS:   Left Common Iliac Artery -   Minimal Diameter-9.6 x 9.4 mm   Tortuosity-mild-to-moderate   Calcification-marked   Left External Iliac Artery -   Minimal Diameter-8.8 x 8.4 mm   Tortuosity-mild-to-moderate   Calcification-none   Left Common Femoral Artery -   Minimal Diameter-7.5 x 7.4 mm   Tortuosity-mild   Calcification-moderate sign rib   Review of the MIP images confirms the above findings.   IMPRESSION: 1. Vascular findings and measurements pertinent to potential TAVR procedure, as detailed. 2. Diffuse thickening and coarse calcification of the aortic valve, compatible with the reported clinical history of severe aortic stenosis. 3. Three-vessel coronary atherosclerosis. 4. Tiny right middle lobe 0.3 cm pulmonary nodule, decreased in size since recent 10/28/2021 CT, most likely a resolving benign inflammatory nodule. 5. 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 6. Aortic Atherosclerosis (ICD10-I70.0).     Electronically Signed   By: Ilona Sorrel M.D.   On: 11/16/2021 12:39      Impression:   This 71 year old gentleman has stage D2, low-flow/low gradient severe aortic stenosis with ejection fraction of 20% and NYHA class II symptoms of exertional fatigue and shortness of breath  consistent with combined chronic systolic and diastolic congestive heart failure.  His ejection fraction 1 year ago was 55%.  The etiology of his drop in ejection fraction is not completely clear.  He has been seen by the heart failure team and work-up for amyloid heart disease was recommended.  Severe aortic stenosis has probably also contributed to this.  I have personally reviewed his 2D echocardiogram, cardiac catheterization, and CTA studies. His echo shows a severely calcified aortic valve with restricted leaflet mobility.  The mean gradient was only 12 mmHg with a valve area by VTI of 0.83 cm with a very low stroke-volume index of 19.  Left ventricular ejection fraction is 20%.  Cardiac catheterization shows moderate nonobstructive coronary disease with moderately elevated right heart pressures.  I  agree that aortic valve replacement is indicated in this patient for relief of his symptoms and to prevent further left ventricular dysfunction.  Given his age and multiple comorbidities I think that transcatheter aortic valve replacement would be the best option for treating him.  His gated cardiac CTA shows anatomy suitable for TAVR using a SAPIEN 3 valve.  His abdominal and pelvic CTA shows adequate pelvic vascular anatomy to allow transfemoral insertion.   The patient was counseled at length regarding treatment alternatives for management of severe symptomatic aortic stenosis. The risks and benefits of surgical intervention has been discussed in detail. Long-term prognosis with medical therapy was discussed. Alternative approaches such as conventional surgical aortic valve replacement, transcatheter aortic valve replacement, and palliative medical therapy were compared and contrasted at length. This discussion was placed in the context of the patient's own specific clinical presentation and past medical history. All of his questions have been addressed.    Following the decision to proceed with  transcatheter aortic valve replacement, a discussion was held regarding what types of management strategies would be attempted intraoperatively in the event of life-threatening complications, including whether or not the patient would be considered a candidate for the use of cardiopulmonary bypass and/or conversion to open sternotomy for attempted surgical intervention.  Despite having multiple comorbidities he is still very active for his age and I think he would be a candidate for emergent sternotomy to manage any intraoperative complications.  The patient is aware of the fact that transient use of cardiopulmonary bypass may be necessary. The patient has been advised of a variety of complications that might develop including but not limited to risks of death, stroke, paravalvular leak, aortic dissection or other major vascular complications, aortic annulus rupture, device embolization, cardiac rupture or perforation, mitral regurgitation, acute myocardial infarction, arrhythmia, heart block or bradycardia requiring permanent pacemaker placement, congestive heart failure, respiratory failure, renal failure, pneumonia, infection, other late complications related to structural valve deterioration or migration, or other complications that might ultimately cause a temporary or permanent loss of functional independence or other long term morbidity. The patient provides full informed consent for the procedure as described and all questions were answered.       Plan:   He is scheduled to have dental extractions on 12/09/2021 and will then be scheduled for transfemoral TAVR using a SAPIEN 3 valve.

## 2022-01-11 ENCOUNTER — Encounter (HOSPITAL_COMMUNITY): Payer: Self-pay | Admitting: Cardiovascular Disease

## 2022-01-11 ENCOUNTER — Inpatient Hospital Stay (HOSPITAL_COMMUNITY): Payer: Medicare HMO

## 2022-01-11 ENCOUNTER — Other Ambulatory Visit: Payer: Self-pay | Admitting: Physician Assistant

## 2022-01-11 ENCOUNTER — Inpatient Hospital Stay (HOSPITAL_COMMUNITY): Payer: Medicare HMO | Admitting: Certified Registered Nurse Anesthetist

## 2022-01-11 ENCOUNTER — Encounter (HOSPITAL_COMMUNITY)
Admission: RE | Disposition: A | Discharge status: Home / Self Care | Payer: Self-pay | Source: Home / Self Care | Attending: Surgery

## 2022-01-11 ENCOUNTER — Inpatient Hospital Stay (HOSPITAL_COMMUNITY)
Admission: RE | Admit: 2022-01-11 | Discharge: 2022-01-13 | DRG: 267 | Disposition: A | Discharge status: Home / Self Care | Payer: Medicare HMO | Attending: Surgery | Admitting: Surgery

## 2022-01-11 ENCOUNTER — Inpatient Hospital Stay (HOSPITAL_COMMUNITY): Payer: Medicare HMO | Admitting: Physician Assistant

## 2022-01-11 DIAGNOSIS — E1151 Type 2 diabetes mellitus with diabetic peripheral angiopathy without gangrene: Secondary | ICD-10-CM | POA: Diagnosis not present

## 2022-01-11 DIAGNOSIS — Z833 Family history of diabetes mellitus: Secondary | ICD-10-CM | POA: Diagnosis not present

## 2022-01-11 DIAGNOSIS — I509 Heart failure, unspecified: Secondary | ICD-10-CM | POA: Diagnosis not present

## 2022-01-11 DIAGNOSIS — E1169 Type 2 diabetes mellitus with other specified complication: Secondary | ICD-10-CM

## 2022-01-11 DIAGNOSIS — E854 Organ-limited amyloidosis: Secondary | ICD-10-CM | POA: Diagnosis not present

## 2022-01-11 DIAGNOSIS — Z954 Presence of other heart-valve replacement: Secondary | ICD-10-CM | POA: Diagnosis not present

## 2022-01-11 DIAGNOSIS — E875 Hyperkalemia: Secondary | ICD-10-CM | POA: Diagnosis not present

## 2022-01-11 DIAGNOSIS — Z89422 Acquired absence of other left toe(s): Secondary | ICD-10-CM

## 2022-01-11 DIAGNOSIS — I371 Nonrheumatic pulmonary valve insufficiency: Secondary | ICD-10-CM | POA: Diagnosis not present

## 2022-01-11 DIAGNOSIS — Z01818 Encounter for other preprocedural examination: Secondary | ICD-10-CM

## 2022-01-11 DIAGNOSIS — I6522 Occlusion and stenosis of left carotid artery: Secondary | ICD-10-CM | POA: Diagnosis present

## 2022-01-11 DIAGNOSIS — Z7984 Long term (current) use of oral hypoglycemic drugs: Secondary | ICD-10-CM

## 2022-01-11 DIAGNOSIS — E1142 Type 2 diabetes mellitus with diabetic polyneuropathy: Secondary | ICD-10-CM | POA: Diagnosis present

## 2022-01-11 DIAGNOSIS — E1122 Type 2 diabetes mellitus with diabetic chronic kidney disease: Secondary | ICD-10-CM | POA: Diagnosis present

## 2022-01-11 DIAGNOSIS — N529 Male erectile dysfunction, unspecified: Secondary | ICD-10-CM | POA: Diagnosis present

## 2022-01-11 DIAGNOSIS — Z7902 Long term (current) use of antithrombotics/antiplatelets: Secondary | ICD-10-CM | POA: Diagnosis not present

## 2022-01-11 DIAGNOSIS — Z7982 Long term (current) use of aspirin: Secondary | ICD-10-CM

## 2022-01-11 DIAGNOSIS — I350 Nonrheumatic aortic (valve) stenosis: Secondary | ICD-10-CM

## 2022-01-11 DIAGNOSIS — Z952 Presence of prosthetic heart valve: Secondary | ICD-10-CM | POA: Diagnosis not present

## 2022-01-11 DIAGNOSIS — Z9852 Vasectomy status: Secondary | ICD-10-CM | POA: Diagnosis not present

## 2022-01-11 DIAGNOSIS — Z79899 Other long term (current) drug therapy: Secondary | ICD-10-CM

## 2022-01-11 DIAGNOSIS — Z006 Encounter for examination for normal comparison and control in clinical research program: Secondary | ICD-10-CM

## 2022-01-11 DIAGNOSIS — E7800 Pure hypercholesterolemia, unspecified: Secondary | ICD-10-CM | POA: Diagnosis present

## 2022-01-11 DIAGNOSIS — I6529 Occlusion and stenosis of unspecified carotid artery: Secondary | ICD-10-CM | POA: Diagnosis present

## 2022-01-11 DIAGNOSIS — I428 Other cardiomyopathies: Secondary | ICD-10-CM

## 2022-01-11 DIAGNOSIS — I110 Hypertensive heart disease with heart failure: Secondary | ICD-10-CM | POA: Diagnosis not present

## 2022-01-11 DIAGNOSIS — Z885 Allergy status to narcotic agent status: Secondary | ICD-10-CM

## 2022-01-11 DIAGNOSIS — Z794 Long term (current) use of insulin: Secondary | ICD-10-CM | POA: Diagnosis not present

## 2022-01-11 DIAGNOSIS — I10 Essential (primary) hypertension: Secondary | ICD-10-CM | POA: Diagnosis present

## 2022-01-11 DIAGNOSIS — Z8249 Family history of ischemic heart disease and other diseases of the circulatory system: Secondary | ICD-10-CM | POA: Diagnosis not present

## 2022-01-11 DIAGNOSIS — N1832 Chronic kidney disease, stage 3b: Secondary | ICD-10-CM | POA: Diagnosis present

## 2022-01-11 DIAGNOSIS — I130 Hypertensive heart and chronic kidney disease with heart failure and stage 1 through stage 4 chronic kidney disease, or unspecified chronic kidney disease: Secondary | ICD-10-CM | POA: Diagnosis not present

## 2022-01-11 DIAGNOSIS — N183 Chronic kidney disease, stage 3 unspecified: Secondary | ICD-10-CM

## 2022-01-11 DIAGNOSIS — I2510 Atherosclerotic heart disease of native coronary artery without angina pectoris: Secondary | ICD-10-CM | POA: Diagnosis present

## 2022-01-11 DIAGNOSIS — E119 Type 2 diabetes mellitus without complications: Secondary | ICD-10-CM

## 2022-01-11 DIAGNOSIS — I739 Peripheral vascular disease, unspecified: Secondary | ICD-10-CM

## 2022-01-11 DIAGNOSIS — I5042 Chronic combined systolic (congestive) and diastolic (congestive) heart failure: Secondary | ICD-10-CM | POA: Diagnosis not present

## 2022-01-11 DIAGNOSIS — E785 Hyperlipidemia, unspecified: Secondary | ICD-10-CM | POA: Diagnosis present

## 2022-01-11 HISTORY — DX: Nonrheumatic aortic (valve) stenosis: I35.0

## 2022-01-11 HISTORY — PX: INTRAOPERATIVE TRANSTHORACIC ECHOCARDIOGRAM: SHX6523

## 2022-01-11 HISTORY — PX: TRANSCATHETER AORTIC VALVE REPLACEMENT, TRANSFEMORAL: SHX6400

## 2022-01-11 HISTORY — DX: Presence of prosthetic heart valve: Z95.2

## 2022-01-11 LAB — TYPE AND SCREEN
ABO/RH(D): O POS
Antibody Screen: POSITIVE

## 2022-01-11 LAB — POCT I-STAT, CHEM 8
BUN: 24 mg/dL — ABNORMAL HIGH (ref 8–23)
BUN: 22 mg/dL (ref 8–23)
Calcium, Ion: 1.21 mmol/L (ref 1.15–1.40)
Calcium, Ion: 1.21 mmol/L (ref 1.15–1.40)
Chloride: 103 mmol/L (ref 98–111)
Chloride: 103 mmol/L (ref 98–111)
Creatinine, Ser: 1.2 mg/dL (ref 0.61–1.24)
Creatinine, Ser: 1.1 mg/dL (ref 0.61–1.24)
Glucose, Bld: 157 mg/dL — ABNORMAL HIGH (ref 70–99)
Glucose, Bld: 144 mg/dL — ABNORMAL HIGH (ref 70–99)
HCT: 43 % (ref 39.0–52.0)
HCT: 44 % (ref 39.0–52.0)
Hemoglobin: 14.6 g/dL (ref 13.0–17.0)
Hemoglobin: 15 g/dL (ref 13.0–17.0)
Potassium: 4.7 mmol/L (ref 3.5–5.1)
Potassium: 4.7 mmol/L (ref 3.5–5.1)
Sodium: 137 mmol/L (ref 135–145)
Sodium: 138 mmol/L (ref 135–145)
TCO2: 24 mmol/L (ref 22–32)
TCO2: 25 mmol/L (ref 22–32)

## 2022-01-11 LAB — PROTIME-INR
INR: 1 (ref 0.8–1.2)
Prothrombin Time: 12.8 seconds (ref 11.4–15.2)

## 2022-01-11 LAB — ECHOCARDIOGRAM LIMITED
AR max vel: 2.41 cm2
AV Area VTI: 1.95 cm2
AV Area mean vel: 1.22 cm2
AV Mean grad: 3 mmHg
AV Peak grad: 6.2 mmHg
Ao pk vel: 1.24 m/s

## 2022-01-11 LAB — GLUCOSE, CAPILLARY
Glucose-Capillary: 142 mg/dL — ABNORMAL HIGH (ref 70–99)
Glucose-Capillary: 118 mg/dL — ABNORMAL HIGH (ref 70–99)
Glucose-Capillary: 207 mg/dL — ABNORMAL HIGH (ref 70–99)

## 2022-01-11 SURGERY — IMPLANTATION, AORTIC VALVE, TRANSCATHETER, FEMORAL APPROACH
Anesthesia: Monitor Anesthesia Care

## 2022-01-11 MED ORDER — ORAL CARE MOUTH RINSE: 15.0000 mL | Freq: Once | OROMUCOSAL | Status: AC

## 2022-01-11 MED ORDER — FERROUS SULFATE 325 (65 FE) MG PO TABS
325.0000 mg | ORAL_TABLET | Freq: Every morning | ORAL | Status: DC
  Administered 2022-01-12 – 2022-01-13 (×2): 325 mg via ORAL
  Filled 2022-01-11 (×2): qty 1

## 2022-01-11 MED ORDER — HEPARIN (PORCINE) IN NACL 1000-0.9 UT/500ML-% IV SOLN
INTRAVENOUS | Status: AC
  Filled 2022-01-11: qty 500

## 2022-01-11 MED ORDER — CHLORHEXIDINE GLUCONATE 4 % EX LIQD: Freq: Once | CUTANEOUS | Status: DC

## 2022-01-11 MED ORDER — ONDANSETRON HCL 4 MG/2ML IJ SOLN
INTRAMUSCULAR | Status: DC | PRN
  Administered 2022-01-11: 4 mg via INTRAVENOUS

## 2022-01-11 MED ORDER — IOHEXOL 350 MG/ML SOLN
INTRAVENOUS | Status: DC | PRN
  Administered 2022-01-11: 20 mL

## 2022-01-11 MED ORDER — CHLORHEXIDINE GLUCONATE 0.12 % MT SOLN
15.0000 mL | Freq: Once | OROMUCOSAL | Status: DC
  Filled 2022-01-11: qty 15

## 2022-01-11 MED ORDER — LIDOCAINE HCL (PF) 1 % IJ SOLN
INTRAMUSCULAR | Status: DC | PRN
  Administered 2022-01-11 (×2): 10 mL

## 2022-01-11 MED ORDER — PROPOFOL 500 MG/50ML IV EMUL
INTRAVENOUS | Status: DC | PRN
  Administered 2022-01-11: 15 ug/kg/min via INTRAVENOUS

## 2022-01-11 MED ORDER — INSULIN ASPART 100 UNIT/ML IJ SOLN
0.0000 [IU] | Freq: Three times a day (TID) | INTRAMUSCULAR | Status: DC
  Administered 2022-01-11: 8 [IU] via SUBCUTANEOUS
  Administered 2022-01-12: 4 [IU] via SUBCUTANEOUS
  Administered 2022-01-12 (×2): 2 [IU] via SUBCUTANEOUS
  Administered 2022-01-12: 12 [IU] via SUBCUTANEOUS
  Administered 2022-01-13: 4 [IU] via SUBCUTANEOUS

## 2022-01-11 MED ORDER — SODIUM CHLORIDE 0.9 % IV SOLN: INTRAVENOUS | Status: AC

## 2022-01-11 MED ORDER — ACETAMINOPHEN 650 MG RE SUPP: 650.0000 mg | Freq: Four times a day (QID) | RECTAL | Status: DC | PRN

## 2022-01-11 MED ORDER — ACETAMINOPHEN 325 MG PO TABS: 650.0000 mg | ORAL_TABLET | Freq: Four times a day (QID) | ORAL | Status: DC | PRN

## 2022-01-11 MED ORDER — GABAPENTIN 400 MG PO CAPS: 800.0000 mg | ORAL_CAPSULE | Freq: Every evening | ORAL | Status: DC | PRN

## 2022-01-11 MED ORDER — PROTAMINE SULFATE 10 MG/ML IV SOLN
INTRAVENOUS | Status: DC | PRN
  Administered 2022-01-11: 120 mg via INTRAVENOUS

## 2022-01-11 MED ORDER — PROPOFOL 10 MG/ML IV BOLUS
INTRAVENOUS | Status: DC | PRN
  Administered 2022-01-11: 10 mg via INTRAVENOUS

## 2022-01-11 MED ORDER — LIDOCAINE HCL 1 % IJ SOLN
INTRAMUSCULAR | Status: AC
  Filled 2022-01-11: qty 20

## 2022-01-11 MED ORDER — CLOPIDOGREL BISULFATE 75 MG PO TABS
75.0000 mg | ORAL_TABLET | Freq: Every day | ORAL | Status: DC
  Administered 2022-01-11: 75 mg via ORAL
  Filled 2022-01-11: qty 1

## 2022-01-11 MED ORDER — CHLORHEXIDINE GLUCONATE 4 % EX LIQD: 30.0000 mL | CUTANEOUS | Status: DC

## 2022-01-11 MED ORDER — NITROGLYCERIN IN D5W 200-5 MCG/ML-% IV SOLN
0.0000 ug/min | INTRAVENOUS | Status: DC
  Filled 2022-01-11: qty 250

## 2022-01-11 MED ORDER — SODIUM CHLORIDE 0.9 % IV SOLN: INTRAVENOUS | Status: DC

## 2022-01-11 MED ORDER — LACTATED RINGERS IV SOLN: INTRAVENOUS | Status: DC

## 2022-01-11 MED ORDER — ATORVASTATIN CALCIUM 40 MG PO TABS
40.0000 mg | ORAL_TABLET | Freq: Every morning | ORAL | Status: DC
  Administered 2022-01-12 – 2022-01-13 (×2): 40 mg via ORAL
  Filled 2022-01-11 (×2): qty 1

## 2022-01-11 MED ORDER — INSULIN ASPART 100 UNIT/ML IJ SOLN: 0.0000 [IU] | INTRAMUSCULAR | Status: DC | PRN

## 2022-01-11 MED ORDER — SODIUM CHLORIDE 0.9% FLUSH
3.0000 mL | Freq: Two times a day (BID) | INTRAVENOUS | Status: DC
  Administered 2022-01-11 – 2022-01-12 (×2): 3 mL via INTRAVENOUS

## 2022-01-11 MED ORDER — CHLORHEXIDINE GLUCONATE 0.12 % MT SOLN
15.0000 mL | Freq: Once | OROMUCOSAL | Status: AC
  Administered 2022-01-11: 15 mL via OROMUCOSAL
  Filled 2022-01-11 (×2): qty 15

## 2022-01-11 MED ORDER — SODIUM CHLORIDE 0.9 % IV SOLN: 250.0000 mL | INTRAVENOUS | Status: DC | PRN

## 2022-01-11 MED ORDER — ISOSORB DINITRATE-HYDRALAZINE 20-37.5 MG PO TABS: 1.0000 | ORAL_TABLET | Freq: Three times a day (TID) | ORAL | Status: DC

## 2022-01-11 MED ORDER — HEPARIN (PORCINE) IN NACL 1000-0.9 UT/500ML-% IV SOLN
INTRAVENOUS | Status: DC | PRN
  Administered 2022-01-11 (×2): 500 mL

## 2022-01-11 MED ORDER — FUROSEMIDE 40 MG PO TABS
40.0000 mg | ORAL_TABLET | Freq: Every day | ORAL | Status: DC
  Administered 2022-01-12 – 2022-01-13 (×2): 40 mg via ORAL
  Filled 2022-01-11 (×2): qty 1

## 2022-01-11 MED ORDER — HEPARIN SODIUM (PORCINE) 1000 UNIT/ML IJ SOLN
INTRAMUSCULAR | Status: DC | PRN
  Administered 2022-01-11: 12000 [IU] via INTRAVENOUS

## 2022-01-11 MED ORDER — CEFAZOLIN SODIUM-DEXTROSE 2-4 GM/100ML-% IV SOLN
2.0000 g | Freq: Three times a day (TID) | INTRAVENOUS | Status: AC
  Administered 2022-01-11 – 2022-01-12 (×2): 2 g via INTRAVENOUS
  Filled 2022-01-11 (×2): qty 100

## 2022-01-11 MED ORDER — ONDANSETRON HCL 4 MG/2ML IJ SOLN: 4.0000 mg | Freq: Four times a day (QID) | INTRAMUSCULAR | Status: DC | PRN

## 2022-01-11 MED ORDER — SODIUM CHLORIDE 0.9% FLUSH: 3.0000 mL | INTRAVENOUS | Status: DC | PRN

## 2022-01-11 MED ORDER — ASPIRIN 81 MG PO TBEC
81.0000 mg | DELAYED_RELEASE_TABLET | Freq: Every morning | ORAL | Status: DC
  Administered 2022-01-12 – 2022-01-13 (×2): 81 mg via ORAL
  Filled 2022-01-11 (×2): qty 1

## 2022-01-11 SURGICAL SUPPLY — 28 items
BAG SNAP BAND KOVER 36X36 (MISCELLANEOUS) ×4 IMPLANT
CABLE ADAPT PACING TEMP 12FT (ADAPTER) IMPLANT
CATH 26 ULTRA DELIVERY (CATHETERS) IMPLANT
CATH DIAG 6FR PIGTAIL ANGLED (CATHETERS) IMPLANT
CATH INFINITI 6F AL2 (CATHETERS) IMPLANT
CATH S G BIP PACING (CATHETERS) IMPLANT
CLOSURE MYNX CONTROL 6F/7F (Vascular Products) IMPLANT
CLOSURE PERCLOSE PROSTYLE (VASCULAR PRODUCTS) IMPLANT
CRIMPER (MISCELLANEOUS) IMPLANT
DEVICE INFLATION ATRION QL2530 (MISCELLANEOUS) IMPLANT
KIT HEART LEFT (KITS) ×2 IMPLANT
KIT MICROPUNCTURE NIT STIFF (SHEATH) IMPLANT
KIT SAPIAN 3 ULTRA RESILIA 26 (Valve) IMPLANT
PACK CARDIAC CATHETERIZATION (CUSTOM PROCEDURE TRAY) ×2 IMPLANT
SHEATH BRITE TIP 7FR 35CM (SHEATH) IMPLANT
SHEATH INTRODUCER SET 20-26 (SHEATH) IMPLANT
SHEATH PINNACLE 6F 10CM (SHEATH) IMPLANT
SHEATH PINNACLE 8F 10CM (SHEATH) IMPLANT
SHEATH PROBE COVER 6X72 (BAG) IMPLANT
STOPCOCK MORSE 400PSI 3WAY (MISCELLANEOUS) ×4 IMPLANT
SYR MEDRAD MARK V 150ML (SYRINGE) IMPLANT
TRANSDUCER W/STOPCOCK (MISCELLANEOUS) ×4 IMPLANT
TUBING CONTRAST HIGH PRESS 48 (TUBING) IMPLANT
WIRE AMPLATZ SS-J .035X180CM (WIRE) IMPLANT
WIRE EMERALD 3MM-J .035X150CM (WIRE) IMPLANT
WIRE EMERALD 3MM-J .035X260CM (WIRE) IMPLANT
WIRE EMERALD ST .035X260CM (WIRE) ×2 IMPLANT
WIRE SAFARI SM CURVE 275 (WIRE) IMPLANT

## 2022-01-11 NOTE — Anesthesia Procedure Notes (Signed)
Procedure Name: MAC Date/Time: 01/11/2022 1:25 PM  Performed by: Inda Coke, CRNAPre-anesthesia Checklist: Patient identified, Emergency Drugs available, Suction available, Timeout performed and Patient being monitored Patient Re-evaluated:Patient Re-evaluated prior to induction Oxygen Delivery Method: Simple face mask Induction Type: IV induction Dental Injury: Teeth and Oropharynx as per pre-operative assessment

## 2022-01-11 NOTE — Consult Note (Signed)
Cardiology Consultation   Patient ID: [name] MRN: [id number]; DOB: 1950-08-28  Admit date: 01/11/2022 Date of Consult: 01/11/2022  PCP:  Caleb [name], [facility] Cardiologist:  Quay Burow, MD        Patient Profile:   Caleb [name] is a 71 y.o. male with a hx of severe aortic stenosis who is being seen 01/11/2022 for the evaluation of TAVR at the request of Caleb [name].  History of Present Illness:   Caleb [name] is a 71 year old gentleman with type 2 diabetes, hypertension, bilateral carotid disease status post TCAR, lower extremity peripheral arterial disease, and nonischemic cardiomyopathy with severe LV dysfunction, presenting today for TAVR.  He has undergone multidisciplinary heart team evaluation and review of his case.  He has severe low-flow low gradient aortic stenosis with marked reduction in LV function from last year with an LVEF of 50 to 55% to this recent study where his LVEF is dropped to 20%.  The mean transaortic gradient is only 12 mmHg but with a stroke-volume index of 19 and severe calcification and restriction of his aortic valve leaflets.  He has developed New York Heart Association functional class III symptoms of fatigue, weakness, and shortness of breath.  Cardiac catheterization has demonstrated mild to moderate nonobstructive coronary artery disease.  Cardiac CT showed a planimeter aortic valve area of 1 cm consistent with severe aortic stenosis.  The patient's aortic valve calcium score is 2478.   Past Medical History:  Diagnosis Date   Aortic stenosis    Carotid artery occlusion    Diabetes mellitus without complication ([place])    Type II   Dyslipidemia 09/27/2012   Erectile dysfunction 09/27/2012   GERD (gastroesophageal reflux disease)    Heart murmur    Hypercholesteremia    Hyperlipidemia 08/12/2012   Hypertension    Hyponatremia 08/14/2012   Neuropathy    Pancreatitis    Pancreatitis, acute 09/27/2012    Peripheral vascular disease ([place])    Vitamin D deficiency 06/23/2015    Past Surgical History:  Procedure Laterality Date   ABDOMINAL AORTOGRAM W/LOWER EXTREMITY N/A 08/08/2019   Procedure: ABDOMINAL AORTOGRAM W/LOWER EXTREMITY;  Surgeon: Lorretta Harp, MD;  Location: [facility];  Service: Cardiovascular;  Laterality: N/A;   ABDOMINAL AORTOGRAM W/LOWER EXTREMITY N/A 11/18/2019   Procedure: ABDOMINAL AORTOGRAM W/LOWER EXTREMITY;  Surgeon: Lorretta Harp, MD;  Location: [facility];  Service: Cardiovascular;  Laterality: N/A;   CARDIAC CATHETERIZATION     COLONOSCOPY  12 years ago   in [place] clinic= normal exam per pt   KNEE SURGERY     MULTIPLE EXTRACTIONS WITH ALVEOLOPLASTY N/A 12/09/2021   Procedure: MULTIPLE EXTRACTION;  Surgeon: Charlaine Dalton, DMD;  Location: [facility];  Service: Dentistry;  Laterality: N/A;   PERIPHERAL VASCULAR INTERVENTION Right 08/08/2019   Procedure: PERIPHERAL VASCULAR INTERVENTION;  Surgeon: Lorretta Harp, MD;  Location: [facility];  Service: Cardiovascular;  Laterality: Right;   PERIPHERAL VASCULAR INTERVENTION Left 11/18/2019   Procedure: PERIPHERAL VASCULAR INTERVENTION;  Surgeon: Lorretta Harp, MD;  Location: [facility];  Service: Cardiovascular;  Laterality: Left;  left popliteal artery   RIGHT/LEFT HEART CATH AND CORONARY ANGIOGRAPHY N/A 10/29/2021   Procedure: RIGHT/LEFT HEART CATH AND CORONARY ANGIOGRAPHY;  Surgeon: Burnell Blanks, MD;  Location: [facility];  Service: Cardiovascular;  Laterality: N/A;   TRANSCAROTID ARTERY REVASCULARIZATION  Left 12/16/2019   Procedure: TRANSCAROTID ARTERY REVASCULARIZATION;  Surgeon: Oneida Alar,  Jessy Oto, MD;  Location: [facility];  Service: Vascular;  Laterality: Left;   TRANSCAROTID ARTERY REVASCULARIZATION  Right 02/03/2020   Procedure: RIGHT TRANSCAROTID ARTERY REVASCULARIZATION;  Surgeon: Elam Dutch, MD;  Location: [facility];  Service: Vascular;  Laterality:  Right;   ULTRASOUND GUIDANCE FOR VASCULAR ACCESS Right 12/16/2019   Procedure: ULTRASOUND GUIDANCE FOR VASCULAR ACCESS;  Surgeon: Elam Dutch, MD;  Location: [facility];  Service: Vascular;  Laterality: Right;   ULTRASOUND GUIDANCE FOR VASCULAR ACCESS Right 02/03/2020   Procedure: ULTRASOUND GUIDANCE FOR VASCULAR ACCESS;  Surgeon: Elam Dutch, MD;  Location[facility];  Service: Vascular;  Laterality: Right;   VASECTOMY       Home Medications:  Prior to Admission medications   Medication Sig Start Date End Date Taking? Authorizing Provider  aspirin EC 81 MG tablet Take 81 mg by mouth in the morning.   Yes [provider]  atorvastatin (LIPITOR) 40 MG tablet Take 40 mg by mouth in the morning. 07/03/20  Yes [provider]  carvedilol (COREG) 12.5 MG tablet Take 1 tablet (12.5 mg total) by mouth 2 (two) times daily with a meal. 10/30/21 12/28/23 Yes Arrien, Jimmy Picket, MD  clopidogrel (PLAVIX) 75 MG tablet Take 1 tablet (75 mg total) by mouth daily. 01/19/21 01/19/22 Yes Caleb Rakes, MD  Empagliflozin-metFORMIN HCl ER (SYNJARDY XR) 25-1000 MG TB24 Take 1 tablet by mouth in the morning.   Yes [provider]  ferrous sulfate 325 (65 FE) MG tablet Take 325 mg by mouth in the morning.   Yes [provider]  furosemide (LASIX) 40 MG tablet Take 1 tablet (40 mg total) by mouth daily. 10/31/21 12/28/23 Yes Arrien, Jimmy Picket, MD  gabapentin (NEURONTIN) 800 MG tablet Take 800 mg by mouth at bedtime as needed (Sleep). 07/03/20  Yes [provider]  insulin NPH-regular Human (NOVOLIN 70/30) (70-30) 100 UNIT/ML injection Inject 12 Units into the skin 2 (two) times daily with a meal. Patient taking differently: Inject 7-8 Units into the skin See admin instructions. Take 7 units in the morning and 8 units at dinner 07/22/20  Yes Caleb Rakes, MD  PRESCRIPTION MEDICATION Inject 0.1-1 mLs as directed See admin instructions. Tri-Mix Standard Strength 5 ml.  Formula: Prostaglandin 10 mcg/ml, Papaverine 30 mg/ml, Phentolamine 1 mg/ml. Inject 0.1 ml into side of penis as directed as needed (to be injected immediately before sexual intercourse) may increase the dose by 0.1 ml every 48 hours to achieve an erection. Max dose 1 ml.   Yes [provider]  sacubitril-valsartan (ENTRESTO) 24-26 MG Take 1 tablet by mouth 2 (two) times daily.   Yes [provider]  vitamin B-12 (CYANOCOBALAMIN) 500 MCG tablet Take 500 mcg by mouth in the morning. 08/24/21  Yes [provider]  isosorbide-hydrALAZINE (BIDIL) 20-37.5 MG tablet Take 1 tablet by mouth 3 (three) times daily. Patient not taking: Reported on 12/07/2021 11/18/21   Caleb Rakes, MD    Inpatient Medications: Scheduled Meds:  chlorhexidine  30 mL Topical UD   chlorhexidine   Topical Once   And   [START ON 01/12/2022] chlorhexidine   Topical Once   [START ON 01/12/2022] chlorhexidine  15 mL Mouth/Throat Once   magnesium sulfate  40 mEq Other To OR   potassium chloride  80 mEq Other To OR   Continuous Infusions:  [START ON 01/12/2022] sodium chloride      ceFAZolin (ANCEF) IV     dexmedetomidine     heparin 30,000 units/NS  1000 mL solution for CELLSAVER     lactated ringers 10 mL/hr at 01/11/22 0944   norepinephrine (LEVOPHED) Adult infusion     PRN Meds: insulin aspart  Allergies:    Allergies  Allergen Reactions   Codeine Nausea And Vomiting   Percocet [Oxycodone-Acetaminophen] Nausea And Vomiting    Social History:   Social History   Socioeconomic History   Marital status: Widowed    Spouse name: Not on file   Number of children: 3   Years of education: Not on file   Highest education level: High school graduate  Occupational History   Occupation: Caregiver    Comment: Special needs    Comment: retired  Tobacco Use   Smoking status: Never   Smokeless tobacco: Never  Vaping Use   Vaping Use: Never used  Substance and Sexual Activity   Alcohol  use: Yes    Comment: occasional   Drug use: No   Sexual activity: Not Currently  Other Topics Concern   Not on file  Social History Narrative   Widower.  3 daughters and 3 grandchildren.     Social Determinants of Health   Financial Resource Strain: Low Risk  (10/29/2021)   Overall Financial Resource Strain (CARDIA)    Difficulty of Paying Living Expenses: Not hard at all  Food Insecurity: No Food Insecurity (10/29/2021)   Hunger Vital Sign    Worried About Running Out of Food in the Last Year: Never true    Ran Out of Food in the Last Year: Never true  Transportation Needs: No Transportation Needs (10/29/2021)   PRAPARE - Hydrologist (Medical): No    Lack of Transportation (Non-Medical): No  Physical Activity: Sufficiently Active (12/06/2020)   Exercise Vital Sign    Days of Exercise per Week: 4 days    Minutes of Exercise per Session: 60 min  Stress: No Stress Concern Present (12/06/2020)   [place]    Feeling of Stress : Not at all  Social Connections: Moderately Isolated (12/06/2020)   Social Connection and Isolation Panel [NHANES]    Frequency of Communication with Friends and Family: More than three times a week    Frequency of Social Gatherings with Friends and Family: More than three times a week    Attends Religious Services: More than 4 times per year    Active Member of [employer] or Organizations: No    Attends Archivist Meetings: Never    Marital Status: Widowed  Intimate Partner Violence: Not At Risk (12/06/2020)   Humiliation, Afraid, Rape, and Kick questionnaire    Fear of Current or Ex-Partner: No    Emotionally Abused: No    Physically Abused: No    Sexually Abused: No    Family History:   Family History  Problem Relation Age of Onset   CAD Father    Hypertension Father    Alcohol abuse Father        Cause of death   Diabetes Mother    Colon polyps  Mother    CAD Brother 49       CABG   Colon cancer Neg Hx    Esophageal cancer Neg Hx    Rectal cancer Neg Hx    Stomach cancer Neg Hx      ROS:  Please see the history of present illness.  All other ROS reviewed and negative.     Physical Exam/Data:   Vitals:  01/11/22 0930 01/11/22 0951  BP: (!) 173/83 (!) 160/87  Pulse: 88   Resp: 18   Temp: 98 F (36.7 C)   TempSrc: Oral   SpO2: 99%   Weight: 80.7 kg   Height: '5\' 7"'$  (1.702 m)    No intake or output data in the 24 hours ending 01/11/22 1116    01/11/2022    9:30 AM 01/07/2022    8:57 AM 12/09/2021    1:20 PM  Last 3 Weights  Weight (lbs) 178 lb 176 lb 14.4 oz 178 lb  Weight (kg) 80.74 kg 80.241 kg 80.74 kg     Body mass index is 27.88 kg/m.  General:  Well nourished, well developed, in no acute distress HEENT: normal Neck: no JVD Vascular: No carotid bruits; Distal pulses 2+ bilaterally Cardiac:  normal S1, S2; RRR; 3/6 crescendo decrescendo murmur at the right upper sternal border Lungs:  clear to auscultation bilaterally, no wheezing, rhonchi or rales  Abd: soft, nontender, no hepatomegaly  Ext: no edema Musculoskeletal:  No deformities, BUE and BLE strength normal and equal Skin: warm and dry  Neuro:  CNs 2-12 intact, no focal abnormalities noted Psych:  Normal affect    Laboratory Data:  High Sensitivity Troponin:  No results for input(s): "TROPONINIHS" in the last 720 hours.   Chemistry Recent Labs  Lab 01/07/22 0907  NA 137  K 5.5*  CL 102  CO2 25  GLUCOSE 168*  BUN 37*  CREATININE 1.79*  CALCIUM 9.2  GFRNONAA 40*  ANIONGAP 10    Recent Labs  Lab 01/07/22 0907  PROT 7.8  ALBUMIN 3.7  AST 46*  ALT 33  ALKPHOS 141*  BILITOT 0.7   Lipids No results for input(s): "CHOL", "TRIG", "HDL", "LABVLDL", "LDLCALC", "CHOLHDL" in the last 168 hours.  Hematology Recent Labs  Lab 01/07/22 0907  WBC 6.6  RBC 5.92*  HGB 16.6  HCT 50.4  MCV 85.1  MCH 28.0  MCHC 32.9  RDW 15.0  PLT  269   Thyroid No results for input(s): "TSH", "FREET4" in the last 168 hours.  BNPNo results for input(s): "BNP", "PROBNP" in the last 168 hours.  DDimer No results for input(s): "DDIMER" in the last 168 hours.   Radiology/Studies:  No results [name].   Assessment and Plan:   Severe, stage D2 low-flow low gradient aortic stenosis with LVEF 20%, NYHA functional class II symptoms of fatigue and shortness of breath.  Cardiac catheterization, 2D echo, and CTA studies have all been reviewed.  Multidisciplinary heart team discussion has occurred.  We all agree the patient is a good candidate for TAVR via a transfemoral approach.  He has suitable access and cardiac anatomy amenable to a 29 mm SAPIEN 3 valve.  Risks, indications, and alternatives to TAVR have been reviewed extensively with the patient who understands and agrees to proceed.   Risk Assessment/Risk Scores:             For questions or updates, please contact [place] Please consult [email or web address] for contact info under    Signed, Sherren Mocha, MD  01/11/2022 11:16 AM

## 2022-01-11 NOTE — Discharge Summary (Addendum)
[facility]VE TEAM  Discharge Summary    Patient ID: Caleb [name] MRN: [id number]; DOB: 1950-10-15  Admit date: 01/11/2022 Discharge date: 01/13/2022  Primary Care Provider: [name], MD  Primary Cardiologist: Quay Burow, MD / Dr. Burt Knack, MD & Dr. Cyndia Bent MD (TAVR)  Discharge Diagnoses    Principal Problem:   S/P TAVR (transcatheter aortic valve replacement) Active Problems:   Hypertension   Hyperlipidemia   Diabetes mellitus without complication (HCC)   Carotid stenosis, asymptomatic   PAD (peripheral artery disease) (HCC)   Non-ischemic cardiomyopathy (HCC)   Stage III chronic kidney disease (HCC)   Severe aortic stenosis  Allergies Allergies  Allergen Reactions   Codeine Nausea And Vomiting   Percocet [Oxycodone-Acetaminophen] Nausea And Vomiting    Diagnostic Studies/Procedures    TAVR OPERATIVE NOTE     Date of Procedure:                01/11/2022   Preoperative Diagnosis:      Severe Aortic Stenosis    Postoperative Diagnosis:    Same    Procedure:        Transcatheter Aortic Valve Replacement - Percutaneous Left Transfemoral Approach             Edwards Sapien 3 Ultra Resilia THV (size 26 mm, model # 9755RSL, serial # [id number])              Co-Surgeons:            Gaye Pollack, MD and Sherren Mocha, MD      Anesthesiologist:                  Wilfrid Lund, MD   Echocardiographer:              Viona Gilmore. O'Neal, MD   Pre-operative Echo Findings: Severe aortic stenosis Severe left ventricular systolic dysfunction   Post-operative Echo Findings: No paravalvular leak Unchanged severe left ventricular systolic dysfunction _____________    Echo 01/12/22: completed but pending formal read at the time of discharge   History of Present Illness     Caleb [name] is a 71 y.o. male with a history of HTN, DMT2, CKD stage IIIa, PAD s/p previous bilateral popliteal artery stenting, carotid artery disease  s/p bilateral TCARs, NICM, chronic combined S/D CHF (EF 20%) with recent admission, and severe LFLG AS who presented to [facility] on 01/11/22 for planned TAVR.   Caleb [name] is followed by Dr. Gwenlyn Found for his cardiology and PAD care. He was initially referred to cardiology service for evaluation of critical limb ischemia.  He underwent peripheral angiography in 07/2019 and underwent drug-coated balloon angioplasty for high-grade right SFA and the popliteal stenosis with two-vessel runoff. Carotid Doppler showed high-grade right greater than left ICA stenosis, he was referred to vascular surgery. Echocardiogram in 08/2019 showed EF had dropped down to 40 to 45% with moderate to severe aortic stenosis. Myoview obtained on 08/30/2019 that showed inferior scar without ischemia. Due to tingling in the left fourth toe, he underwent repeat lower extremity angiography that revealed occluded left popliteal artery with occluded posterior tibial and peroneal with high-grade AT disease, he underwent left popliteal CTO intervention on 11/18/2019 with self-expanding stent. He underwent left fourth and fifth toe amputation by Dr. Posey Pronto on 11/22/2019. Repeat echocardiogram obtained on 12/21/2020 showed EF improved to 50-55%, grade 1 DD, and moderate aortic stenosis.  He was admitted 10/2021 after having two weeks of orthopnea, SOB, and  LE edema. Repeat echocardiogram at that time showed EF was down to 20%, with global hypokinesis, grade 2 DD, moderately reduced RV systolic function, trivial MR, mild to moderate AI, and low-flow low gradient aortic valve stenosis which was at least moderate with an SVI at 19, DI at 0.27, mean gradient at 12.47mHg, and peak at 22.753mg. Cardiac catheterization on 10/29/2021 showed mild nonobstructive disease in the mid and distal LAD and elevated right heart pressures. He was treated with Lasix with improvement. After discharge, he was seen by AHF team for amyloid workup with plans for myeloma panel + PYP scan.  This remains pending at this time until after TAVR performed.   Given poor dentition, he underwent dental extractions 12/09/21 and tolerated this well.    The patient was evaluated by the multidisciplinary valve team and felt to have severe, symptomatic low flow, low gradient severe aortic stenosis  and to be a suitable candidate for TAVR, which was set up for 01/11/22.    Hospital Course    Severe LFLG AS: s/p successful TAVR with a 26 mm Edwards Sapien 3 THV via the TF approach on 01/11/22. Post operative echo completed but pending formal read. Groin sites are stable. ECG with sinus and no high grade heart block. Continued on home ASA and plavix, but then plavix held given groin oozing. Will plan to resume that next week. Plan for discharge home today with early follow up next week.   HTN: BP has been elevated. Resumed on home meds but Entresto held given hyperkalemia. Start hydralazine nitrates given cardiomyopathy (had Bidil previously listed in meds but not taking this and likely too expensive). Watch BP as an outpatient. Can titrate meds as needed.   DMT2: treated with SSI while admitted. Resume home meds at discharge. Okay to resume Syndardy after 48 hours after contrast dye exposure (10/27).   CKD stage IIIb: creat 1.79 on PAT labs. Creat 1.66 today  Hyperkalemia: PAT labs showed K up to 5.5. EnDelene Lollas held. Repeat K showed 4.4. [facility]   PAD: followed by Dr. BeGwenlyn Foundnd [facility]. Continue DAPT and statin.   Chronic combined S/D CHF/NICM: POD 1 echo still pending formal read. He appears euvolemic. Resume home Coreg, empagliflozin (in [place] Lasix. Entresto and spiro both discontinued due to hyperkalemia. Start hydralazine nitrates as his BP has been persistently elevated. Amyloid w/u was started as an outpatient but never completed. PYP was equivocal and multiple myeloma panel pending at the time of DC. He has follow up with Dr. McAundra Dubinext month.    Consultants: None   _____________  Discharge Vitals Blood pressure (!) 150/105, pulse 88, temperature 98.4 F (36.9 C), temperature source Oral, resp. rate 20, height '5\' 7"'  (1.702 m), weight 80 kg, SpO2 98 %.  Filed Weights   01/11/22 0930 01/12/22 0505 01/13/22 0500  Weight: 80.7 kg 80.9 kg 80 kg     GEN: Well nourished, well developed, in no acute distress HEENT: normal Neck: no JVD or masses Cardiac: RRR; no murmurs, rubs, or gallops,no edema  Respiratory:  clear to auscultation bilaterally, normal work of breathing GI: soft, nontender, nondistended, + BS MS: no deformity or atrophy Skin: warm and dry, no rash.  Groin sites clear without hematoma or ecchymosis Neuro:  Alert and Oriented x 3, Strength and sensation are intact Psych: euthymic mood, full affect   Labs & Radiologic Studies    CBC Recent Labs    01/12/22 0357 01/13/22 0121  WBC 6.0 6.9  HGB 13.4 14.2  HCT 41.3 43.4  MCV 84.3 83.6  PLT 211 785   Basic Metabolic Panel Recent Labs    01/12/22 0357 01/13/22 0121  NA 134* 134*  K 5.1 4.4  CL 101 99  CO2 25 24  GLUCOSE 200* 235*  BUN 22 25*  CREATININE 1.48* 1.66*  CALCIUM 8.7* 8.8*  MG 1.8  --    Liver Function Tests No results for input(s): "AST", "ALT", "ALKPHOS", "BILITOT", "PROT", "ALBUMIN" in the last 72 hours. No results for input(s): "LIPASE", "AMYLASE" in the last 72 hours. Cardiac Enzymes No results for input(s): "CKTOTAL", "CKMB", "CKMBINDEX", "TROPONINI" in the last 72 hours. BNP Invalid input(s): "POCBNP" D-Dimer No results for input(s): "DDIMER" in the last 72 hours. Hemoglobin A1C No results for input(s): "HGBA1C" in the last 72 hours. Fasting Lipid Panel No results for input(s): "CHOL", "HDL", "LDLCALC", "TRIG", "CHOLHDL", "LDLDIRECT" in the last 72 hours. Thyroid Function Tests No results for input(s): "TSH", "T4TOTAL", "T3FREE", "THYROIDAB" in the last 72 hours.  Invalid input(s): "FREET3" _____________  NM CARDIAC  AMYLOID TUMOR LOC INFLAM SPECT 1 DAY  Result Date: 01/12/2022 CLINICAL DATA:  HEART FAILURE. CONCERN FOR CARDIAC AMYLOIDOSIS. EXAM: NUCLEAR MEDICINE TUMOR LOCALIZATION. PYP CARDIAC AMYLOIDOSIS SCAN WITH SPECT TECHNIQUE: Following intravenous administration of radiopharmaceutical, anterior planar images of the chest were obtained. Regions of interest were placed on the heart and contralateral chest wall for quantitative assessment. Additional SPECT imaging of the chest was obtained. RADIOPHARMACEUTICALS:  20.1 mCi TC- 25mPYROPHOSPHATE IV FINDINGS: Planar Visual assessment: Anterior planar imaging demonstrates radiotracer uptake within the heart less than than uptake within the adjacent ribs (Grade 1). Quantitative assessment : Quantitative assessment of the cardiac uptake compared to the contralateral chest wall is equal to 1.24 (H/CL = 1.24). SPECT assessment: SPECT imaging of the chest demonstrates only faint radiotracer accumulation within the LEFT ventricle. IMPRESSION: Visual and quantitative assessment (grade 1, H/CL = 1.24) are is equivocally suggestive of transthyretin amyloidosis. Electronically Signed   By: MLavonia DanaM.D.   On: 01/12/2022 15:52   ECHOCARDIOGRAM LIMITED  Result Date: 01/11/2022    ECHOCARDIOGRAM LIMITED REPORT   Patient Name:   Caleb PLACERESDate of Exam: 01/11/2022 Medical Rec #:  [id number]      Height:       67.0 in Accession #:    [id number]     Weight:       178.0 lb Date of Birth:  102/29/1952       BSA:          1.925 m Patient Age:    89years        BP:           160/87 mmHg Patient Gender: M               HR:           83 bpm. Exam Location:  Inpatient Procedure: Limited Echo, Cardiac Doppler and Color Doppler Indications:     Aortic valve disorder I35.9  History:         Patient has prior history of Echocardiogram examinations, most                  recent 10/28/2021. PAD, Signs/Symptoms:Murmur; Risk                  Factors:Hypertension and Dyslipidemia.  Aortic Valve: 26 mm Sapien prosthetic, stented (TAVR) valve is                  present in the aortic position. Procedure Date: 01/11/2022.  Sonographer:     Darlina Sicilian RDCS Referring Phys:  Eileen Stanford Diagnosing Phys: Eleonore Chiquito MD IMPRESSIONS  1. Echo guided TAVR. 26 mm S3 TAVR. Vmax 1.2 m/s, MG 3 mmHG, EOA 1.95 cm2, DI 0.43. Mild paravalvular leak.. There is a 26 mm Sapien prosthetic (TAVR) valve present in the aortic position. Procedure Date: 01/11/2022. Echo findings are consistent with normal structure and function of the aortic valve prosthesis.  2. Left ventricular ejection fraction, by estimation, is 20 to 25%. Left ventricular ejection fraction by 3D volume is 23 %. The left ventricle has severely decreased function. The left ventricle demonstrates global hypokinesis. FINDINGS  Left Ventricle: Left ventricular ejection fraction, by estimation, is 20 to 25%. Left ventricular ejection fraction by 3D volume is 23 %. The left ventricle has severely decreased function. The left ventricle demonstrates global hypokinesis. Pericardium: There is no evidence of pericardial effusion. Aortic Valve: Echo guided TAVR. 26 mm S3 TAVR. Vmax 1.2 m/s, MG 3 mmHG, EOA 1.95 cm2, DI 0.43. Mild paravalvular leak. Aortic valve mean gradient measures 3.0 mmHg. Aortic valve peak gradient measures 6.2 mmHg. Aortic valve area, by VTI measures 1.95 cm. There is a 26 mm Sapien prosthetic, stented (TAVR) valve present in the aortic position. Procedure Date: 01/11/2022. Echo findings are consistent with normal structure and function of the aortic valve prosthesis. LEFT VENTRICLE PLAX 2D LVOT diam:     2.40 cm LV SV:         51 LV SV Index:   26              3D Volume EF LVOT Area:     4.52 cm        LV 3D EF:    Left                                             ventricul                                             ar                                             ejection                                              fraction                                             by 3D  volume is                                             23 %.                                 3D Volume EF:                                3D EF:        23 %                                LV EDV:       187 ml                                LV ESV:       144 ml                                LV SV:        42 ml AORTIC VALVE AV Area (Vmax):    2.41 cm AV Area (Vmean):   1.22 cm AV Area (VTI):     1.95 cm AV Vmax:           124.00 cm/s AV Vmean:          154.433 cm/s AV VTI:            0.260 m AV Peak Grad:      6.2 mmHg AV Mean Grad:      3.0 mmHg LVOT Vmax:         66.00 cm/s LVOT Vmean:        41.600 cm/s LVOT VTI:          0.112 m LVOT/AV VTI ratio: 0.43  SHUNTS Systemic VTI:  0.11 m Systemic Diam: 2.40 cm Eleonore Chiquito MD Electronically signed by Eleonore Chiquito MD Signature Date/Time: 01/11/2022/4:09:40 PM    Final    Structural Heart Procedure  Result Date: 01/11/2022 See surgical note for result.  DG Chest 2 View  Result Date: 01/09/2022 CLINICAL DATA:  Pre-op clearance exam for TAVR.  Aortic stenosis. EXAM: CHEST - 2 VIEW COMPARISON:  10/27/2021 FINDINGS: The heart size and mediastinal contours are within normal limits. Aortic atherosclerotic calcification incidentally noted. Both lungs are clear. The visualized skeletal structures are unremarkable. IMPRESSION: No active cardiopulmonary disease. Electronically Signed   By: Marlaine Hind M.D.   On: 01/09/2022 08:05   Disposition   Pt is being discharged home today in good condition.  Follow-up Plans & Appointments     Follow-up Information     Tommie Raymond, NP. Go on 01/17/2022.   Specialty: Cardiology Why: @ 11am. Please arrive at least 10 min early Contact information: [address] [place] [address] [phone]                Discharge Instructions     Amb Referral to Cardiac Rehabilitation   Complete  by: As directed    Diagnosis: Valve Replacement   Valve: Aortic  After initial evaluation and assessments completed: Virtual Based Care may be provided alone or in conjunction with Phase 2 Cardiac Rehab based on patient barriers.: Yes   Intensive Cardiac Rehabilitation (ICR) [place] location only OR Traditional Cardiac Rehabilitation (TCR) *If criteria for ICR are not met will enroll in TCR [facility] only): Yes       Discharge Medications   Allergies as of 01/13/2022       Reactions   Codeine Nausea And Vomiting   Percocet [oxycodone-acetaminophen] Nausea And Vomiting        Medication List     STOP taking these medications    Entresto 24-26 MG Generic drug: sacubitril-valsartan   isosorbide-hydrALAZINE 20-37.5 MG tablet Commonly known as: BiDil       TAKE these medications    aspirin EC 81 MG tablet Take 81 mg by mouth in the morning.   atorvastatin 40 MG tablet Commonly known as: LIPITOR Take 40 mg by mouth in the morning.   carvedilol 12.5 MG tablet Commonly known as: COREG Take 1 tablet (12.5 mg total) by mouth 2 (two) times daily with a meal.   clopidogrel 75 MG tablet Commonly known as: PLAVIX Take 1 tablet (75 mg total) by mouth daily.   cyanocobalamin 500 MCG tablet Commonly known as: VITAMIN B12 Take 500 mcg by mouth in the morning.   ferrous sulfate 325 (65 FE) MG tablet Take 325 mg by mouth in the morning.   furosemide 40 MG tablet Commonly known as: LASIX Take 1 tablet (40 mg total) by mouth daily.   gabapentin 800 MG tablet Commonly known as: NEURONTIN Take 800 mg by mouth at bedtime as needed (Sleep).   hydrALAZINE 25 MG tablet Commonly known as: APRESOLINE Take 1 tablet (25 mg total) by mouth 3 (three) times daily.   isosorbide mononitrate 30 MG 24 hr tablet Commonly known as: IMDUR Take 1 tablet (30 mg total) by mouth daily.   NovoLIN 70/30 (70-30) 100 UNIT/ML injection Generic drug: insulin NPH-regular Human Inject 12 Units into  the skin 2 (two) times daily with a meal. What changed:  how much to take when to take this additional instructions   PRESCRIPTION MEDICATION Inject 0.1-1 mLs as directed See admin instructions. Tri-Mix Standard Strength 5 ml. Formula: Prostaglandin 10 mcg/ml, Papaverine 30 mg/ml, Phentolamine 1 mg/ml. Inject 0.1 ml into side of penis as directed as needed (to be injected immediately before sexual intercourse) may increase the dose by 0.1 ml every 48 hours to achieve an erection. Max dose 1 ml.   Synjardy XR 25-1000 MG Tb24 Generic drug: Empagliflozin-metFORMIN HCl ER Take 1 tablet by mouth in the morning.          Outstanding Labs/Studies   none  Duration of Discharge Encounter   Greater than 30 minutes including physician time.  Mable Fill, PA-C 01/13/2022, 9:47 AM [phone]

## 2022-01-11 NOTE — Progress Notes (Signed)
Pt admitted to rm 18 from cath lab. CHG wipe given. Initiated tele. Oriented pt to the unit. Vss. Call bell within reach.   Lavenia Atlas, RN

## 2022-01-11 NOTE — Discharge Instructions (Signed)

## 2022-01-11 NOTE — Progress Notes (Signed)
Patient left groin continues to drain. Dressing has been changed 3 times and pressure has been held twice.   Spoke to [facility] MD, cardiology on call and was advised to apply a pressure dressing and hold pressure again.   RN held pressure for 15 minutes.

## 2022-01-11 NOTE — Progress Notes (Signed)
  [facility] VALVE TEAM  Patient doing well s/p TAVR. He is hemodynamically stable. Groin sites stable. ECG with sinus and no high grade block. Plan to DC arterial line and transfer to 4E. Plan for early ambulation after bedrest completed and hopeful discharge over the next 24-48 hours.   Angelena Form PA-C  [facility]  Pager [phone]

## 2022-01-11 NOTE — Interval H&P Note (Signed)
History and Physical Interval Note:  01/11/2022 9:34 AM  Caleb [name]  has presented today for surgery, with the diagnosis of Severe Aortic Stenosis.  The various methods of treatment have been discussed with the patient and family. After consideration of risks, benefits and other options for treatment, the patient has consented to  Procedure(s): Transcatheter Aortic Valve Replacement, Transfemoral (N/A) INTRAOPERATIVE TRANSTHORACIC ECHOCARDIOGRAM (N/A) as a surgical intervention.  The patient's history has been reviewed, patient examined, no change in status, stable for surgery.  I have reviewed the patient's chart and labs.  Questions were answered to the patient's satisfaction.     Coralie Common

## 2022-01-11 NOTE — Anesthesia Procedure Notes (Signed)
Arterial Line Insertion Start/End10/24/2023 10:45 AM, 01/11/2022 11:00 AM Performed by: CRNA  Patient location: Pre-op. Preanesthetic checklist: patient identified, IV checked, site marked, risks and benefits discussed, surgical consent, monitors and equipment checked, pre-op evaluation, timeout performed and anesthesia consent Lidocaine 1% used for infiltration Right, radial was placed Catheter size: 20 G Hand hygiene performed  and maximum sterile barriers used   Attempts: 1 Procedure performed without using ultrasound guided technique. Following insertion, dressing applied and Biopatch. Post procedure assessment: normal and unchanged  Patient tolerated the procedure well with no immediate complications.

## 2022-01-11 NOTE — Op Note (Signed)
[facility]   MULTIDISCIPLINARY HEART VALVE TEAM   TAVR OPERATIVE NOTE   Date of Procedure:  01/11/2022  Preoperative Diagnosis: Severe Aortic Stenosis   Postoperative Diagnosis: Same   Procedure:  Transcatheter Aortic Valve Replacement - Percutaneous  Transfemoral Approach  Edwards Sapien 3 Ultra Resilia THV (size 26 mm, serial # [id number])   Co-Surgeons:  Gaye Pollack, MD and Sherren Mocha, MD  Anesthesiologist:  Suella Broad, MD  Echocardiographer:  Marry Guan, MD  Pre-operative Echo Findings: Severe low-flow low gradient aortic stenosis Severe left ventricular systolic dysfunction  Post-operative Echo Findings: No paravalvular leak Unchanged left ventricular systolic function  BRIEF CLINICAL NOTE AND INDICATIONS FOR SURGERY  71 year old gentleman who has developed progressive LV dysfunction and NYHA functional class II symptoms of fatigue and exertional dyspnea in the setting of severe stage D2 low-flow low gradient aortic stenosis.  After he underwent multidisciplinary heart team evaluation and extensive preoperative testing, he is referred for TAVR via a transfemoral approach.  During the course of the patient's preoperative work up they have been evaluated comprehensively by a multidisciplinary team of specialists coordinated through the [facility] in the [facility].  They have been demonstrated to suffer from symptomatic severe aortic stenosis as noted above. The patient has been counseled extensively as to the relative risks and benefits of all options for the treatment of severe aortic stenosis including long term medical therapy, conventional surgery for aortic valve replacement, and transcatheter aortic valve replacement.  The patient has been independently evaluated in formal cardiac surgical consultation by Dr Cyndia Bent, who deemed the patient appropriate for TAVR. Based upon review of all of the  patient's preoperative diagnostic tests they are felt to be candidate for transcatheter aortic valve replacement using the transfemoral approach as an alternative to conventional surgery.    Following the decision to proceed with transcatheter aortic valve replacement, a discussion has been held regarding what types of management strategies would be attempted intraoperatively in the event of life-threatening complications, including whether or not the patient would be considered a candidate for the use of cardiopulmonary bypass and/or conversion to open sternotomy for attempted surgical intervention.  The patient has been advised of a variety of complications that might develop peculiar to this approach including but not limited to risks of death, stroke, paravalvular leak, aortic dissection or other major vascular complications, aortic annulus rupture, device embolization, cardiac rupture or perforation, acute myocardial infarction, arrhythmia, heart block or bradycardia requiring permanent pacemaker placement, congestive heart failure, respiratory failure, renal failure, pneumonia, infection, other late complications related to structural valve deterioration or migration, or other complications that might ultimately cause a temporary or permanent loss of functional independence or other long term morbidity.  The patient provides full informed consent for the procedure as described and all questions were answered preoperatively.  DETAILS OF THE OPERATIVE PROCEDURE  PREPARATION:   The patient is brought to the operating room on the above mentioned date and central monitoring was established by the anesthesia team including placement of a radial arterial line. The patient is placed in the supine position on the operating table.  Intravenous antibiotics are administered. The patient is monitored closely throughout the procedure under conscious sedation.  Baseline transthoracic echocardiogram is performed. The  patient's chest, abdomen, both groins, and both lower extremities are prepared and draped in a sterile manner. A time out procedure is performed.   PERIPHERAL ACCESS:   Using ultrasound guidance, femoral arterial and  venous access is obtained with placement of 6 Fr sheaths on the right side.  [place] images are digitally captured and stored in the patient's chart. A pigtail diagnostic catheter was passed through the femoral arterial sheath under fluoroscopic guidance into the aortic root.  A temporary transvenous pacemaker catheter was passed through the femoral venous sheath under fluoroscopic guidance into the right ventricle.  The pacemaker was tested to ensure stable lead placement and pacemaker capture. Aortic root angiography was performed in order to determine the optimal angiographic angle for valve deployment.  TRANSFEMORAL ACCESS:  A micropuncture technique is used to access the left femoral artery under fluoroscopic and ultrasound guidance.  2 Perclose devices are deployed at 10' and 2' positions to 'PreClose' the femoral artery. An 8 French sheath is placed and then an Amplatz Superstiff wire is advanced through the sheath. This is changed out for a 14 French transfemoral E-Sheath after progressively dilating over the Superstiff wire.  An AL-2 catheter was used to direct a straight-tip exchange length wire across the native aortic valve into the left ventricle. This was exchanged out for a pigtail catheter and position was confirmed in the LV apex.   The pigtail catheter was exchanged for an Amplatz Extra-stiff wire in the LV apex.    BALLOON AORTIC VALVULOPLASTY:  Not performed  TRANSCATHETER HEART VALVE DEPLOYMENT:  An Edwards Sapien 3 transcatheter heart valve (size 26 mm) was prepared and crimped per manufacturer's guidelines, and the proper orientation of the valve is confirmed on the [employer] delivery system. The valve was advanced through the introducer sheath using normal  technique until in an appropriate position in the abdominal aorta beyond the sheath tip. The balloon was then retracted and using the fine-tuning wheel was centered on the valve. The valve was then advanced across the aortic arch using appropriate flexion of the catheter. The valve was carefully positioned across the aortic valve annulus. The Commander catheter was retracted using normal technique. Once final position of the valve has been confirmed by angiographic assessment, the valve is deployed while temporarily holding ventilation and during rapid ventricular pacing to maintain systolic blood pressure < 50 mmHg and pulse pressure < 10 mmHg. The balloon inflation is held for >3 seconds after reaching full deployment volume. Once the balloon has fully deflated the balloon is retracted into the ascending aorta and valve function is assessed using echocardiography. The patient's hemodynamic recovery following valve deployment is good.  The deployment balloon and guidewire are both removed. Echo demostrated acceptable post-procedural gradients, stable mitral valve function, and no aortic insufficiency.    PROCEDURE COMPLETION:  The sheath was removed and femoral artery closure is performed using the 2 previously deployed Perclose devices.  Protamine is administered once femoral arterial repair was complete. The site is clear with no evidence of bleeding or hematoma after the sutures are tightened. The temporary pacemaker and pigtail catheters are removed. Mynx closure is used for contralateral femoral arterial hemostasis for the 6 Fr sheath.  The patient tolerated the procedure well and is transported to the recovery area in stable condition. There were no immediate intraoperative complications. All sponge instrument and needle counts are verified correct at completion of the operation.   The patient received a total of 20 mL of intravenous contrast during the procedure.   Sherren Mocha,  MD 01/11/2022 4:08 PM

## 2022-01-11 NOTE — Anesthesia Postprocedure Evaluation (Signed)
Anesthesia Post Note  Patient: Caleb [name]  Procedure(s) Performed: Transcatheter Aortic Valve Replacement, Transfemoral INTRAOPERATIVE TRANSTHORACIC ECHOCARDIOGRAM     Patient location during evaluation: PACU Anesthesia Type: MAC Level of consciousness: awake and alert Pain management: pain level controlled Vital Signs Assessment: post-procedure vital signs reviewed and stable Respiratory status: spontaneous breathing, nonlabored ventilation, respiratory function stable and patient connected to nasal cannula oxygen Cardiovascular status: stable and blood pressure returned to baseline Postop Assessment: no apparent nausea or vomiting Anesthetic complications: no   There were no known notable events for this encounter.  Last Vitals:  Vitals:   01/11/22 1610 01/11/22 1620  BP: 128/78 131/78  Pulse: 61 61  Resp: 17 11  Temp:    SpO2: 98% 97%    Last Pain:  Vitals:   01/11/22 1504  TempSrc:   PainSc: 0-No pain                 Effie Berkshire

## 2022-01-11 NOTE — Op Note (Signed)
[facility]   MULTIDISCIPLINARY HEART VALVE TEAM   TAVR OPERATIVE NOTE   Date of Procedure:  01/11/2022  Preoperative Diagnosis: Severe Aortic Stenosis   Postoperative Diagnosis: Same   Procedure:   Transcatheter Aortic Valve Replacement - Percutaneous Left Transfemoral Approach  Edwards Sapien 3 Ultra Resilia THV (size 26 mm, model # 9755RSL, serial # [id number])   Co-Surgeons: Gaye Pollack, MD and Sherren Mocha, MD    Anesthesiologist:  Wilfrid Lund, MD  Echocardiographer:  Viona Gilmore. O'Neal, MD  Pre-operative Echo Findings: Severe aortic stenosis Severe left ventricular systolic dysfunction  Post-operative Echo Findings: No paravalvular leak Unchanged severe left ventricular systolic dysfunction   BRIEF CLINICAL NOTE AND INDICATIONS FOR SURGERY  This 71 year old gentleman has stage D2, low-flow/low gradient severe aortic stenosis with ejection fraction of 20% and NYHA class II symptoms of exertional fatigue and shortness of breath consistent with combined chronic systolic and diastolic congestive heart failure.  His ejection fraction 1 year ago was 55%.  The etiology of his drop in ejection fraction is not completely clear.  He has been seen by the heart failure team and work-up for amyloid heart disease was recommended.  Severe aortic stenosis has probably also contributed to this.  I have personally reviewed his 2D echocardiogram, cardiac catheterization, and CTA studies. His echo shows a severely calcified aortic valve with restricted leaflet mobility.  The mean gradient was only 12 mmHg with a valve area by VTI of 0.83 cm with a very low stroke-volume index of 19.  Left ventricular ejection fraction is 20%.  Cardiac catheterization shows moderate nonobstructive coronary disease with moderately elevated right heart pressures.  I agree that aortic valve replacement is indicated in this patient for relief of his symptoms and to prevent further left ventricular  dysfunction.  Given his age and multiple comorbidities I think that transcatheter aortic valve replacement would be the best option for treating him.  His gated cardiac CTA shows anatomy suitable for TAVR using a SAPIEN 3 valve.  His abdominal and pelvic CTA shows adequate pelvic vascular anatomy to allow transfemoral insertion.   The patient was counseled at length regarding treatment alternatives for management of severe symptomatic aortic stenosis. The risks and benefits of surgical intervention has been discussed in detail. Long-term prognosis with medical therapy was discussed. Alternative approaches such as conventional surgical aortic valve replacement, transcatheter aortic valve replacement, and palliative medical therapy were compared and contrasted at length. This discussion was placed in the context of the patient's own specific clinical presentation and past medical history. All of his questions have been addressed.    Following the decision to proceed with transcatheter aortic valve replacement, a discussion was held regarding what types of management strategies would be attempted intraoperatively in the event of life-threatening complications, including whether or not the patient would be considered a candidate for the use of cardiopulmonary bypass and/or conversion to open sternotomy for attempted surgical intervention.  Despite having multiple comorbidities he is still very active for his age and I think he would be a candidate for emergent sternotomy to manage any intraoperative complications.  The patient is aware of the fact that transient use of cardiopulmonary bypass may be necessary. The patient has been advised of a variety of complications that might develop including but not limited to risks of death, stroke, paravalvular leak, aortic dissection or other major vascular complications, aortic annulus rupture, device embolization, cardiac rupture or perforation, mitral regurgitation, acute  myocardial infarction, arrhythmia, heart block or bradycardia  requiring permanent pacemaker placement, congestive heart failure, respiratory failure, renal failure, pneumonia, infection, other late complications related to structural valve deterioration or migration, or other complications that might ultimately cause a temporary or permanent loss of functional independence or other long term morbidity. The patient provides full informed consent for the procedure as described and all questions were answered.          DETAILS OF THE OPERATIVE PROCEDURE  PREPARATION:    The patient was brought to the operating room on the above mentioned date and appropriate monitoring was established by the anesthesia team. The patient was placed in the supine position on the operating table.  Intravenous antibiotics were administered. The patient was monitored closely throughout the procedure under conscious sedation.   Baseline transthoracic echocardiogram was performed. The patient's abdomen and both groins were prepped and draped in a sterile manner. A time out procedure was performed.   PERIPHERAL ACCESS:    Using the modified Seldinger technique, femoral arterial and venous access was obtained with placement of 6 Fr sheaths on the right side.  A pigtail diagnostic catheter was passed through the right arterial sheath under fluoroscopic guidance into the aortic root.  A temporary transvenous pacemaker catheter was passed through the right femoral venous sheath under fluoroscopic guidance into the right ventricle.  The pacemaker was tested to ensure stable lead placement and pacemaker capture. Aortic root angiography was performed in order to determine the optimal angiographic angle for valve deployment.   TRANSFEMORAL ACCESS:   Percutaneous transfemoral access and sheath placement was performed using ultrasound guidance.  The left common femoral artery was cannulated using a micropuncture needle and  appropriate location was verified using hand injection angiogram.  A pair of [employer] Perclose percutaneous closure devices were placed and a 6 French sheath replaced into the femoral artery.  The patient was heparinized systemically and ACT verified > 250 seconds.    A 14 Fr transfemoral E-sheath was introduced into the left common femoral artery after progressively dilating over an Amplatz superstiff wire. An AL-2 catheter was used to direct a straight-tip exchange length wire across the native aortic valve into the left ventricle. This was exchanged out for a pigtail catheter and position was confirmed in the LV apex. The pigtail catheter was exchanged for a [place] wire in the LV apex.   BALLOON AORTIC VALVULOPLASTY:   Not performed   TRANSCATHETER HEART VALVE DEPLOYMENT:   An Edwards Sapien 3 Ultra transcatheter heart valve (size 26 mm) was prepared and crimped per manufacturer's guidelines, and the proper orientation of the valve is confirmed on the [employer] delivery system. The valve was advanced through the introducer sheath using normal technique until in an appropriate position in the abdominal aorta beyond the sheath tip. The balloon was then retracted and using the fine-tuning wheel was centered on the valve. The valve was then advanced across the aortic arch using appropriate flexion of the catheter. The valve was carefully positioned across the aortic valve annulus. The Commander catheter was retracted using normal technique. Once final position of the valve has been confirmed by angiographic assessment, the valve is deployed during rapid ventricular pacing to maintain systolic blood pressure < 50 mmHg and pulse pressure < 10 mmHg. The balloon inflation is held for >3 seconds after reaching full deployment volume. Once the balloon has fully deflated the balloon is retracted into the ascending aorta and valve function is assessed using echocardiography. There is felt to be no  paravalvular leak and no  central aortic insufficiency.  The patient's hemodynamic recovery following valve deployment is good.  The deployment balloon and guidewire are both removed.    PROCEDURE COMPLETION:   The sheath was removed and femoral artery closure performed.  Protamine was administered once femoral arterial repair was complete. The temporary pacemaker, pigtail catheter and femoral sheaths were removed with manual pressure used for venous hemostasis.  A Mynx femoral closure device was utilized following removal of the diagnostic sheath in the right femoral artery.  The patient tolerated the procedure well and is transported to the cath lab recovery area in stable condition. There were no immediate intraoperative complications. All sponge instrument and needle counts are verified correct at completion of the operation.   No blood products were administered during the operation.  The patient received a total of 20 mL of intravenous contrast during the procedure.   Gaye Pollack, MD 01/11/2022

## 2022-01-11 NOTE — Progress Notes (Signed)
Pressure held to level 1 L Groin  incision for 15 min ([id number]). Drainage, which is minimal, outlined and pt's RN updated.

## 2022-01-11 NOTE — Anesthesia Preprocedure Evaluation (Addendum)
Anesthesia Evaluation  Patient identified by MRN, date of birth, ID band Patient awake    Reviewed: Allergy & Precautions, NPO status , Patient's Chart, lab work & pertinent test results  Airway Mallampati: II  TM Distance: >3 FB Neck ROM: Full    Dental  (+) Teeth Intact, Dental Advisory Given   Pulmonary neg pulmonary ROS,    breath sounds clear to auscultation       Cardiovascular hypertension, Pt. on home beta blockers and Pt. on medications + Peripheral Vascular Disease and +CHF  + Valvular Problems/Murmurs AS  Rhythm:Regular Rate:Normal + Systolic murmurs Echo: 1. Left ventricular ejection fraction, by estimation, is 20%. The left  ventricle has severely decreased function. The left ventricle demonstrates  global hypokinesis. Left ventricular diastolic parameters are consistent  with Grade II diastolic  dysfunction (pseudonormalization).  2. Right ventricular systolic function is moderately reduced. The right  ventricular size is normal. Tricuspid regurgitation signal is inadequate  for assessing PA pressure.  3. Left atrial size was mildly dilated.  4. The mitral valve is degenerative. Trivial mitral valve regurgitation.  No evidence of mitral stenosis.  5. The aortic valve is abnormal. There is severe calcifcation of the  aortic valve. Aortic valve regurgitation is mild to moderate. Low flow low  gradient aortic valve stenosis, with at least moderate aortic valve  stenosis with SVI of 19 and DI 0.27.  Aortic valve mean gradient measures 12.3 mmHg. Aortic valve Vmax measures  2.38 m/s.  6. The inferior vena cava is dilated in size with <50% respiratory  variability, suggesting right atrial pressure of 15 mmHg.    Neuro/Psych Anxiety negative neurological ROS     GI/Hepatic Neg liver ROS, GERD  ,  Endo/Other  diabetes, Type 2, Insulin Dependent  Renal/GU Renal InsufficiencyRenal disease      Musculoskeletal   Abdominal Normal abdominal exam  (+)   Peds  Hematology   Anesthesia Other Findings   Reproductive/Obstetrics                            Anesthesia Physical Anesthesia Plan  ASA: 4  Anesthesia Plan: MAC   Post-op Pain Management:    Induction: Intravenous  PONV Risk Score and Plan: 0 and Propofol infusion  Airway Management Planned: Simple Face Mask and Natural Airway  Additional Equipment: None  Intra-op Plan:   Post-operative Plan:   Informed Consent: I have reviewed the patients History and Physical, chart, labs and discussed the procedure including the risks, benefits and alternatives for the proposed anesthesia with the patient or authorized representative who has indicated his/her understanding and acceptance.     Dental advisory given  Plan Discussed with: CRNA  Anesthesia Plan Comments:        Anesthesia Quick Evaluation

## 2022-01-11 NOTE — Transfer of Care (Signed)
Immediate Anesthesia Transfer of Care Note  Patient: SHANTEL WESELY  Procedure(s) Performed: Transcatheter Aortic Valve Replacement, Transfemoral INTRAOPERATIVE TRANSTHORACIC ECHOCARDIOGRAM  Patient Location: Cath Lab  Anesthesia Type:MAC  Level of Consciousness: awake and drowsy  Airway & Oxygen Therapy: Patient Spontanous Breathing and Patient connected to face mask oxygen  Post-op Assessment: Report given to RN and Post -op Vital signs reviewed and stable  Post vital signs: Reviewed and stable  Last Vitals:  Vitals Value Taken Time  BP 119/71 01/11/22 1505  Temp    Pulse 70 01/11/22 1509  Resp 14 01/11/22 1509  SpO2 98 % 01/11/22 1509  Vitals shown include unvalidated device data.  Last Pain:  Vitals:   01/11/22 0938  TempSrc:   PainSc: 0-No pain      Patients Stated Pain Goal: 0 (60/67/70 3403)  Complications: There were no known notable events for this encounter.

## 2022-01-12 ENCOUNTER — Inpatient Hospital Stay (HOSPITAL_COMMUNITY): Payer: Medicare HMO

## 2022-01-12 ENCOUNTER — Other Ambulatory Visit: Payer: Self-pay

## 2022-01-12 ENCOUNTER — Encounter (HOSPITAL_COMMUNITY): Payer: Self-pay | Admitting: Cardiovascular Disease

## 2022-01-12 ENCOUNTER — Ambulatory Visit: Payer: Self-pay

## 2022-01-12 DIAGNOSIS — Z954 Presence of other heart-valve replacement: Secondary | ICD-10-CM

## 2022-01-12 DIAGNOSIS — I350 Nonrheumatic aortic (valve) stenosis: Secondary | ICD-10-CM

## 2022-01-12 DIAGNOSIS — I130 Hypertensive heart and chronic kidney disease with heart failure and stage 1 through stage 4 chronic kidney disease, or unspecified chronic kidney disease: Secondary | ICD-10-CM | POA: Diagnosis not present

## 2022-01-12 DIAGNOSIS — Z952 Presence of prosthetic heart valve: Secondary | ICD-10-CM

## 2022-01-12 DIAGNOSIS — I5042 Chronic combined systolic (congestive) and diastolic (congestive) heart failure: Secondary | ICD-10-CM | POA: Diagnosis not present

## 2022-01-12 DIAGNOSIS — Z006 Encounter for examination for normal comparison and control in clinical research program: Secondary | ICD-10-CM | POA: Diagnosis not present

## 2022-01-12 LAB — GLUCOSE, CAPILLARY
Glucose-Capillary: 190 mg/dL — ABNORMAL HIGH (ref 70–99)
Glucose-Capillary: 135 mg/dL — ABNORMAL HIGH (ref 70–99)
Glucose-Capillary: 288 mg/dL — ABNORMAL HIGH (ref 70–99)
Glucose-Capillary: 153 mg/dL — ABNORMAL HIGH (ref 70–99)

## 2022-01-12 LAB — BASIC METABOLIC PANEL
Anion gap: 8 (ref 5–15)
BUN: 22 mg/dL (ref 8–23)
CO2: 25 mmol/L (ref 22–32)
Calcium: 8.7 mg/dL — ABNORMAL LOW (ref 8.9–10.3)
Chloride: 101 mmol/L (ref 98–111)
Creatinine, Ser: 1.48 mg/dL — ABNORMAL HIGH (ref 0.61–1.24)
GFR, Estimated: 50 mL/min — ABNORMAL LOW (ref >60)
Glucose, Bld: 200 mg/dL — ABNORMAL HIGH (ref 70–99)
Potassium: 5.1 mmol/L (ref 3.5–5.1)
Sodium: 134 mmol/L — ABNORMAL LOW (ref 135–145)

## 2022-01-12 LAB — CBC
HCT: 41.3 % (ref 39.0–52.0)
Hemoglobin: 13.4 g/dL (ref 13.0–17.0)
MCH: 27.3 pg (ref 26.0–34.0)
MCHC: 32.4 g/dL (ref 30.0–36.0)
MCV: 84.3 fL (ref 80.0–100.0)
Platelets: 211 10*3/uL (ref 150–400)
RBC: 4.9 MIL/uL (ref 4.22–5.81)
RDW: 15.1 % (ref 11.5–15.5)
WBC: 6 10*3/uL (ref 4.0–10.5)
nRBC: 0 % (ref 0.0–0.2)

## 2022-01-12 LAB — MAGNESIUM: Magnesium: 1.8 mg/dL (ref 1.7–2.4)

## 2022-01-12 MED ORDER — CARVEDILOL 12.5 MG PO TABS: 12.5000 mg | ORAL_TABLET | Freq: Two times a day (BID) | ORAL | Status: DC

## 2022-01-12 MED ORDER — LIDOCAINE-EPINEPHRINE 1 %-1:100000 IJ SOLN
INTRAMUSCULAR | Status: AC
  Filled 2022-01-12: qty 1

## 2022-01-12 MED ORDER — TECHNETIUM TC 99M PYROPHOSPHATE
20.1000 | Freq: Once | INTRAVENOUS | Status: AC | PRN
  Administered 2022-01-12: 20.1 via INTRAVENOUS

## 2022-01-12 MED ORDER — GABAPENTIN 400 MG PO CAPS
800.0000 mg | ORAL_CAPSULE | Freq: Once | ORAL | Status: AC
  Administered 2022-01-12: 800 mg via ORAL
  Filled 2022-01-12: qty 2

## 2022-01-12 MED ORDER — HYDRALAZINE HCL 20 MG/ML IJ SOLN
10.0000 mg | INTRAMUSCULAR | Status: DC | PRN
  Administered 2022-01-12 (×2): 10 mg via INTRAVENOUS
  Filled 2022-01-12 (×2): qty 1

## 2022-01-12 MED ORDER — LIDOCAINE-EPINEPHRINE 1 %-1:100000 IJ SOLN
20.0000 mL | Freq: Once | INTRAMUSCULAR | Status: AC
  Administered 2022-01-12: 20 mL
  Filled 2022-01-12: qty 20

## 2022-01-12 MED ORDER — TECHNETIUM TC 99M MEDRONATE IV KIT: 20.0000 | PACK | Freq: Once | INTRAVENOUS | Status: DC | PRN

## 2022-01-12 MED ORDER — PERFLUTREN LIPID MICROSPHERE
1.0000 mL | INTRAVENOUS | Status: AC | PRN
  Administered 2022-01-12: 4 mL via INTRAVENOUS

## 2022-01-12 MED ORDER — CARVEDILOL 12.5 MG PO TABS
12.5000 mg | ORAL_TABLET | Freq: Two times a day (BID) | ORAL | Status: DC
  Administered 2022-01-12 – 2022-01-13 (×3): 12.5 mg via ORAL
  Filled 2022-01-12 (×3): qty 1

## 2022-01-12 NOTE — Progress Notes (Signed)
Pt in bed eating, agreeable to ambulation initial BP 186/117, repeat BP 186/84 he reported he took BP med before I arrived. After following up 30-40 minutes later pt BP 179/90, ambulation deferred until BP is better managed.. Nurse aware.

## 2022-01-12 NOTE — Progress Notes (Addendum)
Notified PA about pt's bp. Hydralazine 10 mg iv PRN received. Administered pt the med. Will continue to monitor the pt.   Lavenia Atlas, RN

## 2022-01-12 NOTE — Progress Notes (Addendum)
Inpatient Diabetes Program Recommendations  AACE/ADA: New Consensus Statement on Inpatient Glycemic Control (2015)  Target Ranges:  Prepandial:   less than 140 mg/dL      Peak postprandial:   less than 180 mg/dL (1-2 hours)      Critically ill patients:  140 - 180 mg/dL   Lab Results  Component Value Date   GLUCAP 135 (H) 01/12/2022   HGBA1C 8.2 (H) 01/07/2022    Review of Glycemic Control  Latest Reference Range & Units 01/11/22 09:37 01/11/22 18:05 01/11/22 21:51 01/12/22 06:09 01/12/22 12:08  Glucose-Capillary 70 - 99 mg/dL 142 (H) 118 (H) 207 (H) 190 (H) 135 (H)   Diabetes history: DM 2 Outpatient Diabetes medications:  Synjardy XR 25-1000 mg q AM Novolog 70/30 7 units in the AM and 8 units q PM Current orders for Inpatient glycemic control:  TCTS Novolog tid with meals and HS   Inpatient Diabetes Program Recommendations:    Consider adding Semglee 8 units q HS and reduce Novolog correction to moderate tid with meals and HS scale while in the hospital.  Should be able to restart 70/30 insulin at discharge per PCP.   Thanks,  Adah Perl, RN, BC-ADM Inpatient Diabetes Coordinator Pager [phone]  (8a-5p)

## 2022-01-12 NOTE — Patient Outreach (Signed)
  Care Coordination   Chart Review  Visit Note   01/12/2022 Name: Caleb [name] MRN: [id number] DOB: [birth year]  Caleb [name] is a 71 y.o. year old male who sees [name], MD for primary care. I reviewed patient's chart today in preparation to contact patient for RN CC follow up.   What matters to the patients health and wellness today?  Patient is currently inpatient at [facility]             This Visit's Progress    Care Coordination Activities: further follow up needed       Care Coordination Interventions: Reviewed chart in preparation to contact patient, noted patient was admitted to [facility] on 01/11/22 with dx: S/P TAVR (transcatheter aortic valve replacement           SDOH assessments and interventions completed:  No     Care Coordination Interventions Activated:  Yes  Care Coordination Interventions:  Yes, provided   Follow up plan: Follow up call scheduled for 02/08/22 '@11'$ :00 AM    Encounter Outcome:  Pt. Visit Completed

## 2022-01-12 NOTE — Progress Notes (Signed)
Groin assessment-   Site- Left groin dressing saturated with blood upon arrival, no hematoma present- Nell Range at bedside and gave an order for Epinephrine with Lido injection to the site.   2.5 cc of Lidocaine with Epinephrine injected into the track of the left groin access site. Slight pressure held for 10 minutes.   Site is level 0 no oozing no hematoma--  Rupert Stacks RN assessed the site  and patient given post instructions and verbalized understanding.

## 2022-01-12 NOTE — Progress Notes (Signed)
Echocardiogram 2D Echocardiogram has been performed.  Caleb [name] 01/12/2022, 12:04 PM

## 2022-01-12 NOTE — Progress Notes (Signed)
Pt's L site dressing saturated with blood. No hematoma present. Changed new dressing. Dr. Burt Knack notified. Pt's VSS. Will continue to monitor the pt.   Lavenia Atlas, RN

## 2022-01-12 NOTE — Progress Notes (Addendum)
[place] VALVE TEAM  Patient Name: Caleb [name] Date of Encounter: 01/12/2022  Admit date: 01/11/2022  Primary Care Provider: [name], [facility] Cardiologist: Quay Burow, MD / Dr. Burt Knack, MD & Dr. Cyndia Bent MD (TAVR) [facility] Electrophysiologist:  None   [facility] Problem List     Principal Problem:   S/P TAVR (transcatheter aortic valve replacement) Active Problems:   Hypertension   Hyperlipidemia   Diabetes mellitus without complication (HCC)   Carotid stenosis, asymptomatic   PAD (peripheral artery disease) (HCC)   Non-ischemic cardiomyopathy ([place])   Stage III chronic kidney disease ([place])   Severe aortic stenosis     Subjective   Feeling great with no complaints. Groin site has been oozing.   Inpatient Medications    Scheduled Meds:  aspirin EC  81 mg Oral q AM   atorvastatin  40 mg Oral q AM   carvedilol  12.5 mg Oral BID WC   ferrous sulfate  325 mg Oral q AM   furosemide  40 mg Oral Daily   insulin aspart  0-24 Units Subcutaneous TID AC & HS   lidocaine-EPINEPHrine  20 mL Other Once   sodium chloride flush  3 mL Intravenous Q12H   Continuous Infusions:  sodium chloride     nitroGLYCERIN     PRN Meds: sodium chloride, acetaminophen **OR** acetaminophen, gabapentin, ondansetron (ZOFRAN) IV, sodium chloride flush   Vital Signs    Vitals:   01/11/22 2300 01/12/22 0300 01/12/22 0505 01/12/22 0716  BP: (!) 140/90 131/75  (!) 157/86  Pulse: 76 88  82  Resp: 16   18  Temp: 98.5 F (36.9 C) 98.5 F (36.9 C)  99.3 F (37.4 C)  TempSrc: Oral Oral  Oral  SpO2: 98% 100%  100%  Weight:   80.9 kg   Height:        Intake/Output Summary (Last 24 hours) at 01/12/2022 0911 Last data filed at 01/12/2022 0505 Gross per 24 hour  Intake 1394.84 ml  Output 925 ml  Net 469.84 ml   Filed Weights   01/11/22 0930 01/12/22 0505  Weight: 80.7 kg 80.9 kg    Physical Exam    GEN: Well  nourished, well developed, in no acute distress.  HEENT: Grossly normal.  Neck: Supple, no JVD, carotid bruits, or masses. Cardiac: RRR, no murmurs, rubs, or gallops. No clubbing, cyanosis, edema.   Respiratory:  Respirations regular and unlabored, clear to auscultation bilaterally. GI: Soft, nontender, nondistended, BS + x 4. MS: no deformity or atrophy. Skin: warm and dry, no rash.  Groin sites clear without hematoma or ecchymosis. Fresh blood noted on right bandage Neuro:  Strength and sensation are intact. Psych: AAOx3.  Normal affect.  Labs    CBC Recent Labs    01/11/22 1530 01/12/22 0357  WBC  --  6.0  HGB 15.0 13.4  HCT 44.0 41.3  MCV  --  84.3  PLT  --  976   Basic Metabolic Panel Recent Labs    01/11/22 1530 01/12/22 0357  NA 138 134*  K 4.7 5.1  CL 103 101  CO2  --  25  GLUCOSE 144* 200*  BUN 22 22  CREATININE 1.10 1.48*  CALCIUM  --  8.7*  MG  --  1.8   Liver Function Tests No results for input(s): "AST", "ALT", "ALKPHOS", "BILITOT", "PROT", "ALBUMIN" in the last 72 hours. No results for input(s): "LIPASE", "AMYLASE" in the last 72 hours.  Cardiac Enzymes No results for input(s): "CKTOTAL", "CKMB", "CKMBINDEX", "TROPONINI" in the last 72 hours. BNP Invalid input(s): "POCBNP" D-Dimer No results for input(s): "DDIMER" in the last 72 hours. Hemoglobin A1C No results for input(s): "HGBA1C" in the last 72 hours. Fasting Lipid Panel No results for input(s): "CHOL", "HDL", "LDLCALC", "TRIG", "CHOLHDL", "LDLDIRECT" in the last 72 hours. Thyroid Function Tests No results for input(s): "TSH", "T4TOTAL", "T3FREE", "THYROIDAB" in the last 72 hours.  Invalid input(s): "FREET3"  Telemetry    Sinus  - Personally Reviewed  ECG    Sinus HR 81 - Personally Reviewed  Radiology    ECHOCARDIOGRAM LIMITED  Result Date: 01/11/2022    ECHOCARDIOGRAM LIMITED REPORT   Patient Name:   Caleb [name] Date of Exam: 01/11/2022 Medical Rec #:  [id number]        Height:       67.0 in Accession #:    [id number]      Weight:       178.0 lb Date of Birth:  09-21-1950        BSA:          1.925 m Patient Age:    71 years        BP:           160/87 mmHg Patient Gender: M               HR:           83 bpm. Exam Location:  Inpatient Procedure: Limited Echo, Cardiac Doppler and Color Doppler Indications:     Aortic valve disorder I35.9  History:         Patient has prior history of Echocardiogram examinations, most                  recent 10/28/2021. PAD, Signs/Symptoms:Murmur; Risk                  Factors:Hypertension and Dyslipidemia.                  Aortic Valve: 26 mm Sapien prosthetic, stented (TAVR) valve is                  present in the aortic position. Procedure Date: 01/11/2022.  Sonographer:     Darlina Sicilian RDCS Referring Phys:  Eileen Stanford Diagnosing Phys: Eleonore Chiquito MD IMPRESSIONS  1. Echo guided TAVR. 26 mm S3 TAVR. Vmax 1.2 m/s, MG 3 mmHG, EOA 1.95 cm2, DI 0.43. Mild paravalvular leak.. There is a 26 mm Sapien prosthetic (TAVR) valve present in the aortic position. Procedure Date: 01/11/2022. Echo findings are consistent with normal structure and function of the aortic valve prosthesis.  2. Left ventricular ejection fraction, by estimation, is 20 to 25%. Left ventricular ejection fraction by 3D volume is 23 %. The left ventricle has severely decreased function. The left ventricle demonstrates global hypokinesis. FINDINGS  Left Ventricle: Left ventricular ejection fraction, by estimation, is 20 to 25%. Left ventricular ejection fraction by 3D volume is 23 %. The left ventricle has severely decreased function. The left ventricle demonstrates global hypokinesis. Pericardium: There is no evidence of pericardial effusion. Aortic Valve: Echo guided TAVR. 26 mm S3 TAVR. Vmax 1.2 m/s, MG 3 mmHG, EOA 1.95 cm2, DI 0.43. Mild paravalvular leak. Aortic valve mean gradient measures 3.0 mmHg. Aortic valve peak gradient measures 6.2 mmHg. Aortic valve area, by VTI  measures 1.95 cm. There is a 26 mm Sapien prosthetic, stented (TAVR) valve present in the  aortic position. Procedure Date: 01/11/2022. Echo findings are consistent with normal structure and function of the aortic valve prosthesis. LEFT VENTRICLE PLAX 2D LVOT diam:     2.40 cm LV SV:         51 LV SV Index:   26              3D Volume EF LVOT Area:     4.52 cm        LV 3D EF:    Left                                             ventricul                                             ar                                             ejection                                             fraction                                             by 3D                                             volume is                                             23 %.                                 3D Volume EF:                                3D EF:        23 %                                LV EDV:       187 ml                                LV ESV:       144 ml                                LV SV:  42 ml AORTIC VALVE AV Area (Vmax):    2.41 cm AV Area (Vmean):   1.22 cm AV Area (VTI):     1.95 cm AV Vmax:           124.00 cm/s AV Vmean:          154.433 cm/s AV VTI:            0.260 m AV Peak Grad:      6.2 mmHg AV Mean Grad:      3.0 mmHg LVOT Vmax:         66.00 cm/s LVOT Vmean:        41.600 cm/s LVOT VTI:          0.112 m LVOT/AV VTI ratio: 0.43  SHUNTS Systemic VTI:  0.11 m Systemic Diam: 2.40 cm Eleonore Chiquito MD Electronically signed by Eleonore Chiquito MD Signature Date/Time: 01/11/2022/4:09:40 PM    Final    Structural Heart Procedure  Result Date: 01/11/2022 See surgical note for result.   Cardiac Studies   TAVR OPERATIVE NOTE     Date of Procedure:                01/11/2022   Preoperative Diagnosis:      Severe Aortic Stenosis    Postoperative Diagnosis:    Same    Procedure:        Transcatheter Aortic Valve Replacement - Percutaneous Left Transfemoral Approach             Edwards Sapien 3 Ultra Resilia  THV (size 26 mm, model # 9755RSL, serial # [id number])              Co-Surgeons:            Gaye Pollack, MD and Sherren Mocha, MD      Anesthesiologist:                  Wilfrid Lund, MD   Echocardiographer:              Viona Gilmore. O'Neal, MD   Pre-operative Echo Findings: Severe aortic stenosis Severe left ventricular systolic dysfunction   Post-operative Echo Findings: No paravalvular leak Unchanged severe left ventricular systolic dysfunction _____________     Echo 01/12/22: pending   Patient Profile     Caleb [name] is a 71 y.o. male with a history of HTN, DMT2, CKD stage IIIa, PAD s/p previous bilateral popliteal artery stenting, carotid artery disease s/p bilateral TCARs, NICM, chronic combined S/D CHF (EF 20%) with recent admission, and severe LFLG AS who presented to [facility] on 01/11/22 for planned TAVR.  Assessment & Plan    Severe LFLG AS: s/p successful TAVR with a 26 mm Edwards Sapien 3 THV via the TF approach on 01/11/22. Post operative echo pending. Groin sites are stable. ECG with sinus and no high grade heart block. Continued on home ASA and plavix.  Will hold plavix today given oozing at groin right groin site. This was treated with one injection of lido+epi. Will need to mobilize him today and see how he does. Plan for DC home tomorrow.    HTN: BP has been elevated. Resumed on home meds but Entresto held given hyperkalemia.    DMT2: continue SSI   CKD stage IIIb: creat 1.79 on PAT labs. Creat improved to 1.48 today   Hyperkalemia: PAT labs showed K up to 5.5. [name] was held. Repeat K showed 5.1. will continue to hold for now.    PAD: followed  by Dr. Gwenlyn Found and [facility]. Continue DAPT and statin.    Chronic combined S/D CHF/NICM: will follow EF on echo today. Overall, appears euvolemic. Entresto on hold due to hyperkalemia. Resume home Coreg and will resume empagliflozin-metformin tomorrow.     SignedAngelena Form, PA-C  01/12/2022, 9:11 AM  Pager [phone]     Chart reviewed, patient examined, agree with above. He feels well. Says his breathing is better although he has been laying on his back all night due to oozing from left groin. Bandage had to be changed several times overnight and pressure held last night. The groin sites look ok with no hematoma or tenderness. Hgb 13.4 this am which is a little lower than his istat Hgb yesterday afternoon. He had good hemostasis after the perclose devices so it may be subcutaneous. We will need to keep him in hospital today for observation of groins and mobilization. Plan home tomorrow if groins remain dry.

## 2022-01-13 LAB — CBC
HCT: 43.4 % (ref 39.0–52.0)
Hemoglobin: 14.2 g/dL (ref 13.0–17.0)
MCH: 27.4 pg (ref 26.0–34.0)
MCHC: 32.7 g/dL (ref 30.0–36.0)
MCV: 83.6 fL (ref 80.0–100.0)
Platelets: 207 10*3/uL (ref 150–400)
RBC: 5.19 MIL/uL (ref 4.22–5.81)
RDW: 14.9 % (ref 11.5–15.5)
WBC: 6.9 10*3/uL (ref 4.0–10.5)
nRBC: 0 % (ref 0.0–0.2)

## 2022-01-13 LAB — ECHOCARDIOGRAM COMPLETE
AR max vel: 2.33 cm2
AV Area VTI: 2.34 cm2
AV Area mean vel: 2.22 cm2
AV Mean grad: 5.7 mmHg
AV Peak grad: 9.7 mmHg
Ao pk vel: 1.55 m/s
Area-P 1/2: 4.89 cm2
Calc EF: 31.6 %
Height: 67 in
S' Lateral: 4.3 cm
Single Plane A2C EF: 27.9 %
Single Plane A4C EF: 34.2 %
Weight: 2854.4 oz

## 2022-01-13 LAB — GLUCOSE, CAPILLARY: Glucose-Capillary: 167 mg/dL — ABNORMAL HIGH (ref 70–99)

## 2022-01-13 LAB — BASIC METABOLIC PANEL
Anion gap: 11 (ref 5–15)
BUN: 25 mg/dL — ABNORMAL HIGH (ref 8–23)
CO2: 24 mmol/L (ref 22–32)
Calcium: 8.8 mg/dL — ABNORMAL LOW (ref 8.9–10.3)
Chloride: 99 mmol/L (ref 98–111)
Creatinine, Ser: 1.66 mg/dL — ABNORMAL HIGH (ref 0.61–1.24)
GFR, Estimated: 44 mL/min — ABNORMAL LOW (ref >60)
Glucose, Bld: 235 mg/dL — ABNORMAL HIGH (ref 70–99)
Potassium: 4.4 mmol/L (ref 3.5–5.1)
Sodium: 134 mmol/L — ABNORMAL LOW (ref 135–145)

## 2022-01-13 LAB — KAPPA/LAMBDA LIGHT CHAINS
Kappa free light chain: 58.9 mg/L — ABNORMAL HIGH (ref 3.3–19.4)
Kappa, lambda light chain ratio: 1.37 (ref 0.26–1.65)
Lambda free light chains: 42.9 mg/L — ABNORMAL HIGH (ref 5.7–26.3)

## 2022-01-13 MED ORDER — ISOSORBIDE MONONITRATE ER 30 MG PO TB24: 30.0000 mg | ORAL_TABLET | Freq: Every day | ORAL | 3 refills | Status: DC

## 2022-01-13 MED ORDER — HYDRALAZINE HCL 25 MG PO TABS
25.0000 mg | ORAL_TABLET | Freq: Once | ORAL | Status: AC
  Administered 2022-01-13: 25 mg via ORAL
  Filled 2022-01-13: qty 1

## 2022-01-13 MED ORDER — ISOSORBIDE MONONITRATE ER 30 MG PO TB24
30.0000 mg | ORAL_TABLET | Freq: Once | ORAL | Status: AC
  Administered 2022-01-13: 30 mg via ORAL
  Filled 2022-01-13: qty 1

## 2022-01-13 MED ORDER — HYDRALAZINE HCL 25 MG PO TABS: 25.0000 mg | ORAL_TABLET | Freq: Three times a day (TID) | ORAL | 3 refills | Status: DC

## 2022-01-13 NOTE — Consult Note (Signed)
   [facility] Inpatient Consult   01/13/2022  [name] March 05, 1951 [id number]  [facility] [ACO] Patient: Caleb [name]  Primary Care Provider:  [name], MD, [facility] is listed to provide the transition of care follow up  Admitted for a TAVR, medium risk score  Patient is currently active with [facility] for chronic disease management services.  Patient has recently been engaged by a [place].  Our community based plan of care has focused on disease management and community resource support.    Plan: Reviewed for additional follow up needs, no new needs continue follow up with [facility] RNCM for care coordination noted for next month. Glenard Haring [place] aware of hospitalization   Of note, [facility] Care Management services does not replace or interfere with any services that are needed or arranged by inpatient [facility] care management team.  For additional questions or referrals please contact:   Natividad Brood, RN BSN [place]  [phone] business mobile phone Toll free office [phone]  *[place]  [phone] Fax number: [phone] [email or web address]

## 2022-01-13 NOTE — Progress Notes (Signed)
Discharge instructions discussed with patient.  Patient instructed on home medications, restrictions, and follow up appointments. Belongings gathered and sent with patient.  Patients prescription given to him.    Patient discharged via wheelchair by this Probation officer.

## 2022-01-13 NOTE — Progress Notes (Addendum)
CARDIAC REHAB PHASE I       Post TAVR education including site care, risk factors, restrictions, exercise guidelines, heart healthy diet and CRP2 reviewed. All questions and concerns addressed. Will refer to [facility] for CRP2. Pt ambulating well independently.  Dressed and ready for discharge home.   [id number] Vanessa Barbara, RN BSN 01/13/2022 9:44 AM

## 2022-01-13 NOTE — Progress Notes (Signed)
[facility]                                     Cardiology Office Note:    Date:  01/17/2022   ID:  Caleb [name], DOB [birth year], MRN [id number]  PCP:  Caleb [name], [facility] Cardiologist:  Caleb Burow, MD  / Dr. Burt Knack, MD & Dr. Cyndia Bent MD (TAVR) [facility] Electrophysiologist:  None   Referring MD: Caleb Rakes, MD   Chief Complaint  Patient presents with   Follow-up    TOC s/p TAVR   History of Present Illness:    Caleb [name] is a 71 y.o. male with a hx of HTN, DMT2, CKD stage IIIa, PAD s/p previous bilateral popliteal artery stenting, carotid artery disease s/p bilateral TCARs, NICM, chronic combined S/D CHF (EF 20%) with recent admission, and severe LFLG AS who presents to clinic for [facility] follow up s/p TAVR 01/11/22.    [name] is followed by Dr. Gwenlyn [name] for his cardiology and PAD care. He was initially referred to cardiology service for evaluation of critical limb ischemia.  He underwent peripheral angiography in 07/2019 and underwent drug-coated balloon angioplasty for high-grade right SFA and the popliteal stenosis with two-vessel runoff. Carotid Doppler showed high-grade right greater than left ICA stenosis, he was referred to vascular surgery. Echocardiogram in 08/2019 showed EF had dropped down to 40 to 45% with moderate to severe aortic stenosis. Myoview obtained on 08/30/2019 that showed inferior scar without ischemia. Due to tingling in the left fourth toe, he underwent repeat lower extremity angiography that revealed occluded left popliteal artery with occluded posterior tibial and peroneal with high-grade AT disease, he underwent left popliteal CTO intervention on 11/18/2019 with self-expanding stent. He underwent left fourth and fifth toe amputation by Dr. Posey [name] on 11/22/2019. Repeat echocardiogram obtained on 12/21/2020 showed EF improved to 50-55%, grade 1 DD, and moderate aortic stenosis.   He was  admitted 10/2021 after having two weeks of orthopnea, SOB, and LE edema. Repeat echocardiogram at that time showed EF was down to 20%, with global hypokinesis, grade 2 DD, moderately reduced RV systolic function, trivial MR, mild to moderate AI, and low-flow low gradient aortic valve stenosis which was at least moderate with an SVI at 19, DI at 0.27, mean gradient at 12.4mHg, and peak at 22.739mg. Cardiac catheterization on 10/29/2021 showed mild nonobstructive disease in the mid and distal LAD and elevated right heart pressures. He was treated with Lasix with improvement. After discharge, he was seen by AHF team for amyloid workup with plans for myeloma panel + PYP scan. This remains pending at this time until after TAVR performed.    Given poor dentition, he underwent dental extractions 12/09/21 and tolerated this well.     The patient was evaluated by the multidisciplinary valve team and felt to have severe, symptomatic low flow, low gradient severe aortic stenosis and is now s/p successful TAVR with a 26 mm Edwards Sapien 3 THV via the TF approach on 01/11/22. Post operative echo showed stable valve placement with mean gradient at 5.37m21m, EOA 2.34cm2, and DI at 0.48. He had some post-operative groin oozing therefore his Plavix was held briefly.   He is here today and reports that he is feeling great after TAVR. He has returned to the gym and is tolerating this well. He denies chest pain, SOB,  LE edema, palpitations, orthopnea, dizziness, or syncope. Groin site is stable with no evidence of hematoma. He has restarted his Plavix and is therefore on DAPT with ASA and Plavix due to PAD. He was seen earlier today at the [place] and sees his PCP later today with labs therefore wishes to not have this done today. Appears that his Caleb [name] was held due to hyperkalemia. BP is stable today at 120/80. He was also started on hydralazine and nitrates.   Past Medical History:  Diagnosis Date   Carotid artery occlusion     Diabetes mellitus without complication ([place])    Type II   Dyslipidemia 09/27/2012   Erectile dysfunction 09/27/2012   GERD (gastroesophageal reflux disease)    Hypercholesteremia    Hyperlipidemia 08/12/2012   Hypertension    Hyponatremia 08/14/2012   Neuropathy    Pancreatitis    Pancreatitis, acute 09/27/2012   Peripheral vascular disease ([place])    S/P 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 Burt [name] & Caleb [name]   Severe aortic stenosis    Vitamin D deficiency 06/23/2015    Past Surgical History:  Procedure Laterality Date   ABDOMINAL AORTOGRAM W/LOWER EXTREMITY N/A 08/08/2019   Procedure: ABDOMINAL AORTOGRAM W/LOWER EXTREMITY;  Surgeon: Caleb Harp, MD;  Location: [place] [facility];  Service: Cardiovascular;  Laterality: N/A;   ABDOMINAL AORTOGRAM W/LOWER EXTREMITY N/A 11/18/2019   Procedure: ABDOMINAL AORTOGRAM W/LOWER EXTREMITY;  Surgeon: Caleb Harp, MD;  Location: [place] [facility];  Service: Cardiovascular;  Laterality: N/A;   CARDIAC CATHETERIZATION     COLONOSCOPY  12 years ago   in [place] clinic= normal exam per pt   INTRAOPERATIVE TRANSTHORACIC ECHOCARDIOGRAM N/A 01/11/2022   Procedure: INTRAOPERATIVE TRANSTHORACIC ECHOCARDIOGRAM;  Surgeon: Caleb Mocha, MD;  Location: [place] [facility];  Service: Open Heart Surgery;  Laterality: N/A;   KNEE SURGERY     MULTIPLE EXTRACTIONS WITH ALVEOLOPLASTY N/A 12/09/2021   Procedure: MULTIPLE EXTRACTION;  Surgeon: Caleb [name], DMD;  Location: [place];  Service: Dentistry;  Laterality: N/A;   PERIPHERAL VASCULAR INTERVENTION Right 08/08/2019   Procedure: PERIPHERAL VASCULAR INTERVENTION;  Surgeon: Caleb Harp, MD;  Location: [place] [facility];  Service: Cardiovascular;  Laterality: Right;   PERIPHERAL VASCULAR INTERVENTION Left 11/18/2019   Procedure: PERIPHERAL VASCULAR INTERVENTION;  Surgeon: Caleb Harp, MD;  Location: [place] [facility];  Service: Cardiovascular;  Laterality: Left;  left popliteal artery   RIGHT/LEFT HEART CATH AND CORONARY ANGIOGRAPHY N/A 10/29/2021   Procedure: RIGHT/LEFT HEART CATH AND CORONARY ANGIOGRAPHY;  Surgeon: Burnell Blanks, MD;  Location: [place] [facility];  Service: Cardiovascular;  Laterality: N/A;   TRANSCAROTID ARTERY REVASCULARIZATION  Left 12/16/2019   Procedure: TRANSCAROTID ARTERY REVASCULARIZATION;  Surgeon: Elam Dutch, MD;  Location: [place];  Service: Vascular;  Laterality: Left;   TRANSCAROTID ARTERY REVASCULARIZATION  Right 02/03/2020   Procedure: RIGHT TRANSCAROTID ARTERY REVASCULARIZATION;  Surgeon: Elam Dutch, MD;  Location: [place];  Service: Vascular;  Laterality: Right;   TRANSCATHETER AORTIC VALVE REPLACEMENT, TRANSFEMORAL N/A 01/11/2022   Procedure: Transcatheter Aortic Valve Replacement, Transfemoral;  Surgeon: Caleb Mocha, MD;  Location: [place] [facility];  Service: Open Heart Surgery;  Laterality: N/A;   ULTRASOUND GUIDANCE FOR VASCULAR ACCESS Right 12/16/2019   Procedure: ULTRASOUND GUIDANCE FOR VASCULAR ACCESS;  Surgeon: Elam Dutch, MD;  Location: [place];  Service: Vascular;  Laterality: Right;  ULTRASOUND GUIDANCE FOR VASCULAR ACCESS Right 02/03/2020   Procedure: ULTRASOUND GUIDANCE FOR VASCULAR ACCESS;  Surgeon: Elam Dutch, MD;  Location[facility];  Service: Vascular;  Laterality: Right;   VASECTOMY      Current Medications: Current Meds  Medication Sig   amoxicillin (AMOXIL) 500 MG capsule Take 4 capsules (2,000 mg total) by mouth as directed. Take 4 tablets 1 hour prior to dental work, including cleanings.   aspirin EC 81 MG tablet Take 81 mg by mouth in the morning.   atorvastatin (LIPITOR) 40 MG tablet Take 40 mg by mouth in the morning.   carvedilol (COREG) 12.5 MG tablet Take 1 tablet (12.5 mg total) by mouth 2 (two) times daily with a meal.   clopidogrel (PLAVIX) 75 MG tablet Take 1 tablet (75 mg total) by  mouth daily.   Empagliflozin-metFORMIN HCl ER (SYNJARDY XR) 25-1000 MG TB24 Take 1 tablet by mouth in the morning.   ferrous sulfate 325 (65 FE) MG tablet Take 325 mg by mouth in the morning.   furosemide (LASIX) 40 MG tablet Take 1 tablet (40 mg total) by mouth daily.   gabapentin (NEURONTIN) 800 MG tablet Take 800 mg by mouth at bedtime as needed (Sleep).   hydrALAZINE (APRESOLINE) 25 MG tablet Take 1 tablet (25 mg total) by mouth 3 (three) times daily.   insulin NPH-regular Human (NOVOLIN 70/30) (70-30) 100 UNIT/ML injection Inject 12 Units into the skin 2 (two) times daily with a meal. (Patient taking differently: Inject 7-8 Units into the skin See admin instructions. Take 7 units in the morning and 8 units at dinner)   isosorbide mononitrate (IMDUR) 30 MG 24 hr tablet Take 1 tablet (30 mg total) by mouth daily.   PRESCRIPTION MEDICATION Inject 0.1-1 mLs as directed See admin instructions. Tri-Mix Standard Strength 5 ml. Formula: Prostaglandin 10 mcg/ml, Papaverine 30 mg/ml, Phentolamine 1 mg/ml. Inject 0.1 ml into side of penis as directed as needed (to be injected immediately before sexual intercourse) may increase the dose by 0.1 ml every 48 hours to achieve an erection. Max dose 1 ml.   vitamin B-12 (CYANOCOBALAMIN) 500 MCG tablet Take 500 mcg by mouth in the morning.   [DISCONTINUED] amoxicillin (AMOXIL) 500 MG tablet Take 1 tablet (500 mg total) by mouth as directed. Take (4) tablets one hour before procedure   Current Facility-Administered Medications for the 01/17/22 encounter (Office Visit) with [facility]  Medication   sodium chloride flush (NS) 0.9 % injection 3 mL   sodium chloride flush (NS) 0.9 % injection 3 mL     Allergies:   Codeine and Percocet [oxycodone-acetaminophen]   Social History   Socioeconomic History   Marital status: Widowed    Spouse name: Not on file   Number of children: 3   Years of education: Not on file   Highest education level:  High school graduate  Occupational History   Occupation: Caregiver    Comment: Special needs    Comment: retired  Tobacco Use   Smoking status: Never   Smokeless tobacco: Never  Vaping Use   Vaping Use: Never used  Substance and Sexual Activity   Alcohol use: Yes    Comment: occasional   Drug use: No   Sexual activity: Not Currently  Other Topics Concern   Not on file  Social History Narrative   Widower.  3 daughters and 3 grandchildren.     Social Determinants of Health   Financial Resource Strain: Low Risk  (10/29/2021)  Overall Financial Resource Strain (CARDIA)    Difficulty of Paying Living Expenses: Not hard at all  Food Insecurity: No Food Insecurity (01/14/2022)   Hunger Vital Sign    Worried About Running Out of Food in the Last Year: Never true    Ran Out of Food in the Last Year: Never true  Transportation Needs: No Transportation Needs (01/14/2022)   PRAPARE - Hydrologist (Medical): No    Lack of Transportation (Non-Medical): No  Physical Activity: Sufficiently Active (12/06/2020)   Exercise Vital Sign    Days of Exercise per Week: 4 days    Minutes of Exercise per Session: 60 min  Stress: No Stress Concern Present (12/06/2020)   Harmony    Feeling of Stress : Not at all  Social Connections: Moderately Isolated (12/06/2020)   Social Connection and Isolation Panel [NHANES]    Frequency of Communication with Friends and Family: More than three times a week    Frequency of Social Gatherings with Friends and Family: More than three times a week    Attends Religious Services: More than 4 times per year    Active Member of [employer] or Organizations: No    Attends Archivist Meetings: Never    Marital Status: Widowed    Family History: The patient's family history includes Alcohol abuse in his father; CAD in his father; CAD (age of onset: 14) in his brother;  Colon polyps in his mother; Diabetes in his mother; Hypertension in his father. There is no history of Colon cancer, Esophageal cancer, Rectal cancer, or Stomach cancer.  ROS:   Please see the history of present illness.    All other systems reviewed and are negative.  EKGs/Labs/Other Studies Reviewed:    The following studies were reviewed today:  TAVR OPERATIVE NOTE     Date of Procedure:                01/11/2022   Preoperative Diagnosis:      Severe Aortic Stenosis    Postoperative Diagnosis:    Same    Procedure:        Transcatheter Aortic Valve Replacement - Percutaneous Left Transfemoral Approach             Edwards Sapien 3 Ultra Resilia THV (size 26 mm, model # 9755RSL, serial # [id number])              Co-Surgeons:            Gaye Pollack, MD and Caleb Mocha, MD      Anesthesiologist:                  Wilfrid Lund, MD   Echocardiographer:              Viona Gilmore. O'Neal, MD   Pre-operative Echo Findings: Severe aortic stenosis Severe left ventricular systolic dysfunction   Post-operative Echo Findings: No paravalvular leak Unchanged severe left ventricular systolic dysfunction _____________     Echo 01/12/22:    1. 26 mm S3. Vmax 1.5 m/s, MG 5.7 mmHG, EOA 2.34 cm2, DI 0.48. No  regurgitation or paravalvular leak. The aortic valve has been  repaired/replaced. Aortic valve regurgitation is not visualized. There is  a 26 mm Sapien prosthetic (TAVR) valve present in  the aortic position. Procedure Date: 01/11/22. Echo findings are  consistent with normal structure and function of the aortic valve  prosthesis.   2. Left  ventricular ejection fraction, by estimation, is 20 to 25%. The  left ventricle has severely decreased function. The left ventricle  demonstrates global hypokinesis. Left ventricular diastolic parameters are  consistent with Grade II diastolic  dysfunction (pseudonormalization).   3. Right ventricular systolic function is moderately reduced. The  right  ventricular size is normal. There is normal pulmonary artery systolic  pressure. The estimated right ventricular systolic pressure is 01.7 mmHg.   4. Left atrial size was mildly dilated.   5. The mitral valve is grossly normal. Trivial mitral valve  regurgitation. No evidence of mitral stenosis.   6. The inferior vena cava is dilated in size with >50% respiratory  variability, suggesting right atrial pressure of 8 mmHg.   EKG:  EKG is ordered today.  The ekg ordered today demonstrates NSR, HR 80bpm  Recent Labs: 10/27/2021: B Natriuretic Peptide 1,165.9 10/28/2021: TSH 2.565 01/07/2022: ALT 33 01/12/2022: Magnesium 1.8 01/13/2022: BUN 25; Creatinine, Ser 1.66; Hemoglobin 14.2; Platelets 207; Potassium 4.4; Sodium 134   Recent Lipid Panel    Component Value Date/Time   CHOL 106 06/05/2020 0947   TRIG 138 06/05/2020 0947   HDL 53 06/05/2020 0947   CHOLHDL 2.0 06/05/2020 0947   CHOLHDL 3.9 04/02/2013 1632   VLDL 66 (H) 04/02/2013 1632   LDLCALC 29 06/05/2020 0947   Physical Exam:    VS:  BP 120/80 (BP Location: Left Arm, Patient Position: Sitting, Cuff Size: Normal)   Pulse 80   Ht '5\' 7"'  (1.702 m)   Wt 180 lb (81.6 kg)   SpO2 96%   BMI 28.19 kg/m     Wt Readings from Last 3 Encounters:  01/17/22 180 lb (81.6 kg)  01/13/22 176 lb 5.9 oz (80 kg)  01/07/22 176 lb 14.4 oz (80.2 kg)    General: Well developed, well nourished, NAD Lungs:Clear to ausculation bilaterally. No wheezes, rales, or rhonchi. Breathing is unlabored. Cardiovascular: RRR with S1 S2. No murmurs Extremities: No edema. Groin site stable.  Neuro: Alert and oriented. No focal deficits. No facial asymmetry. MAE spontaneously. Psych: Responds to questions appropriately with normal affect.    ASSESSMENT/PLAN:    Severe LFLG AS: Doing well with NYHA class I symptoms s/p successful TAVR with a 26 mm Edwards Sapien 3 THV via the TF approach on 01/11/22. Post operative echo  showed stable valve placement  with mean gradient at 5.68mHg, EOA 2.34cm2, and DI at 0.48. He had some post-operative groin oozing therefore his Plavix was held briefly however he has now been continued on DAPT with ASA/Plavix. Groin sites stable today. Dental SBE discussed and RX sent with Amoxicillin sent to preferred pharmacy. Plan one month follow up with echocardiogram.   Chronic combined S/D CHF/NICM: Post TAVR echo with stable valve placement with mean gradient at 5.710mg, EOA 2.34cm2, and DI at 0.48. Resumed on home Coreg, empagliflozin (in [place] Lasix. Entresto and spiro both discontinued due to hyperkalemia during hospital course. Started on hydralazine/nitrates. Amyloid w/u was started as an outpatient but never completed. PYP was equivocal and multiple myeloma panel pending at the time of DC. He has follow up with Dr. McAundra Dubin1/2023. Plan to restart Entresto and Sprio if BP and K+ allows at that time. Patient euvolemic on exam today, no SOB.   HTN: BP was elevated s/p TAVR. Home medications resumed however Entresto held due to hyperkalemia. [facility] and wishes to not have this today. I will attempt to obtain these labs to check  K+. Continue to hold Entresto for now. BP stable at 120/80. Started on hydralazine/nitrates at discharge due to cardiomyopathy.    DMT2: Resumed on Syndardy 48 hours after TAVR.    CKD stage IIIb: Baseline appears to be in the 1.4-1.6 range.    Hyperkalemia: PAT labs showed K up to 5.5. Caleb [name] was held. Repeat K showed 4.4. Plan to restart with Dr. Aundra Dubin if labs stable at that time.    PAD: Followed by Dr. Gwenlyn [name] and [facility]. Continue DAPT and statin.     Medication Adjustments/Labs and Tests Ordered: Current medicines are reviewed at length with the patient today.  Concerns regarding medicines are outlined above.  No orders of the defined types were placed in this encounter.  Meds ordered this encounter  Medications   DISCONTD: amoxicillin (AMOXIL)  500 MG tablet    Sig: Take 1 tablet (500 mg total) by mouth as directed. Take (4) tablets one hour before procedure    Dispense:  12 tablet    Refill:  4   amoxicillin (AMOXIL) 500 MG capsule    Sig: Take 4 capsules (2,000 mg total) by mouth as directed. Take 4 tablets 1 hour prior to dental work, including cleanings.    Dispense:  12 capsule    Refill:  12    Order Specific Question:   Supervising Provider    Answer:   Caleb [name] [[id number]]    Patient Instructions  Medication Instructions:  The current medical regimen is effective;  continue present plan and medications.  *If you need a refill on your cardiac medications before your next appointment, please call your pharmacy*  Follow-Up: At [facility], you and your health needs are our priority.  As part of our continuing mission to provide you with exceptional heart care, we have created designated Provider Care Teams.  These Care Teams include your primary Cardiologist (physician) and Advanced Practice Providers (APPs -  Physician Assistants and Nurse Practitioners) who all work together to provide you with the care you need, when you need it.  We recommend signing up for the patient portal called "MyChart".  Sign up information is provided on this After Visit Summary.  MyChart is used to connect with patients for Virtual Visits (Telemedicine).  Patients are able to view lab/test results, encounter notes, upcoming appointments, etc.  Non-urgent messages can be sent to your provider as well.   To learn more about what you can do with MyChart, go to [email or web address].    Your next appointment:   Follow up as scheduled.   Important Information About Sugar         Signed, Kathyrn Drown, NP  01/17/2022 6:05 PM    [facility]

## 2022-01-14 ENCOUNTER — Telehealth: Payer: Self-pay | Admitting: Physician Assistant

## 2022-01-14 ENCOUNTER — Telehealth: Payer: Self-pay

## 2022-01-14 NOTE — Telephone Encounter (Signed)
  [place] VALVE TEAM   Patient contacted regarding discharge from [facility] on 10/26  Patient understands to follow up with a structural heart APP on 11/30 at [place] [facility].  Patient understands discharge instructions? yes Patient understands medications and regimen? yes Patient understands to bring all medications to this visit? yes  Angelena Form PA-C  [facility]

## 2022-01-14 NOTE — Patient Outreach (Signed)
  Care Coordination TOC Note Transition Care Management Follow-up Telephone Call Date of discharge and from where: 01/13/22-[place] Dx: s/p TAVR How have you been since you were released from the hospital? Patient pleased to report how  well he is doing. States that he can tell a difference from the procedure. He has "more energy and feels less winded." Any questions or concerns? No  Items Reviewed: Did the pt receive and understand the discharge instructions provided? Yes  Medications obtained and verified? Yes  Other? Yes  Any new allergies since your discharge? No  Dietary orders reviewed? Yes Do you have support at home? Yes   Home Care and Equipment/Supplies: Were home health services ordered? not applicable If so, what is the name of the agency? N/A  Has the agency set up a time to come to the patient's home? not applicable Were any new equipment or medical supplies ordered?  No What is the name of the medical supply agency? N/A Were you able to get the supplies/equipment? not applicable Do you have any questions related to the use of the equipment or supplies? No  Functional Questionnaire: (I = Independent and D = Dependent) ADLs: I  Bathing/Dressing- I  Meal Prep- I  Eating- I  Maintaining continence- I  Transferring/Ambulation- I  Managing Meds- I  Follow up appointments reviewed:  PCP Hospital f/u appt confirmed? Yes  Scheduled to see Dr. Margarita Rana on 01/20/22 @ 10:30am. [place] [facility] f/u appt confirmed? Yes  Scheduled to see Karolee Stamps on 01/17/22 @ 11am. Are transportation arrangements needed? No  If their condition worsens, is the pt aware to call PCP or go to the Emergency Dept.? Yes Was the patient provided with contact information for the PCP's office or ED? Yes Was to pt encouraged to call back with questions or concerns? Yes  SDOH assessments and interventions completed:   Yes  Care Coordination Interventions Activated:  Yes   Care  Coordination Interventions:  Education provided. Patient already has scheduled upcoming appt with assigned RN CM     Encounter Outcome:  Pt. Visit Completed    Enzo Montgomery, RN,BSN,CCM [place] Management Telephonic Care Management Coordinator Direct Phone: [phone] Toll Free: [phone] Fax: [phone]

## 2022-01-17 ENCOUNTER — Ambulatory Visit: Payer: Medicare HMO | Attending: Cardiology | Admitting: Cardiology

## 2022-01-17 VITALS — BP 120/80 | HR 80 | Ht 67.0 in | Wt 180.0 lb

## 2022-01-17 DIAGNOSIS — E785 Hyperlipidemia, unspecified: Secondary | ICD-10-CM | POA: Diagnosis not present

## 2022-01-17 DIAGNOSIS — I350 Nonrheumatic aortic (valve) stenosis: Secondary | ICD-10-CM | POA: Diagnosis not present

## 2022-01-17 DIAGNOSIS — E875 Hyperkalemia: Secondary | ICD-10-CM

## 2022-01-17 DIAGNOSIS — I5022 Chronic systolic (congestive) heart failure: Secondary | ICD-10-CM | POA: Diagnosis not present

## 2022-01-17 DIAGNOSIS — Z952 Presence of prosthetic heart valve: Secondary | ICD-10-CM

## 2022-01-17 DIAGNOSIS — I428 Other cardiomyopathies: Secondary | ICD-10-CM | POA: Diagnosis not present

## 2022-01-17 DIAGNOSIS — E1169 Type 2 diabetes mellitus with other specified complication: Secondary | ICD-10-CM

## 2022-01-17 DIAGNOSIS — I10 Essential (primary) hypertension: Secondary | ICD-10-CM

## 2022-01-17 DIAGNOSIS — I739 Peripheral vascular disease, unspecified: Secondary | ICD-10-CM

## 2022-01-17 LAB — MULTIPLE MYELOMA PANEL, SERUM
Albumin SerPl Elph-Mcnc: 3.4 g/dL (ref 2.9–4.4)
Albumin/Glob SerPl: 1.1 (ref 0.7–1.7)
Alpha 1: 0.3 g/dL (ref 0.0–0.4)
Alpha2 Glob SerPl Elph-Mcnc: 0.6 g/dL (ref 0.4–1.0)
B-Globulin SerPl Elph-Mcnc: 1.1 g/dL (ref 0.7–1.3)
Gamma Glob SerPl Elph-Mcnc: 1.5 g/dL (ref 0.4–1.8)
Globulin, Total: 3.4 g/dL (ref 2.2–3.9)
IgA: 420 mg/dL (ref 61–437)
IgG (Immunoglobin G), Serum: 1775 mg/dL — ABNORMAL HIGH (ref 603–1613)
IgM (Immunoglobulin M), Srm: 77 mg/dL (ref 15–143)
Total Protein ELP: 6.8 g/dL (ref 6.0–8.5)

## 2022-01-17 MED ORDER — AMOXICILLIN 500 MG PO CAPS: 2000.0000 mg | ORAL_CAPSULE | ORAL | 12 refills | Status: DC

## 2022-01-17 MED ORDER — AMOXICILLIN 500 MG PO TABS: 500.0000 mg | ORAL_TABLET | ORAL | 4 refills | Status: DC

## 2022-01-17 NOTE — Patient Instructions (Signed)
Medication Instructions:  The current medical regimen is effective;  continue present plan and medications.  *If you need a refill on your cardiac medications before your next appointment, please call your pharmacy*  Follow-Up: At [facility], you and your health needs are our priority.  As part of our continuing mission to provide you with exceptional heart care, we have created designated Provider Care Teams.  These Care Teams include your primary Cardiologist (physician) and Advanced Practice Providers (APPs -  Physician Assistants and Nurse Practitioners) who all work together to provide you with the care you need, when you need it.  We recommend signing up for the patient portal called "MyChart".  Sign up information is provided on this After Visit Summary.  MyChart is used to connect with patients for Virtual Visits (Telemedicine).  Patients are able to view lab/test results, encounter notes, upcoming appointments, etc.  Non-urgent messages can be sent to your provider as well.   To learn more about what you can do with MyChart, go to [email or web address].    Your next appointment:   Follow up as scheduled.   Important Information About Sugar

## 2022-01-18 MED ORDER — AMOXICILLIN 500 MG PO CAPS: 2000.0000 mg | ORAL_CAPSULE | ORAL | 12 refills | Status: DC

## 2022-01-18 NOTE — Addendum Note (Signed)
Addended by: Gaetano Net on: 01/18/2022 02:39 PM   Modules accepted: Orders

## 2022-01-18 NOTE — Addendum Note (Signed)
Addended by: Janan Halter F on: 01/18/2022 07:04 PM   Modules accepted: Orders

## 2022-01-19 ENCOUNTER — Telehealth: Payer: Self-pay | Admitting: Internal Medicine

## 2022-01-19 NOTE — Telephone Encounter (Signed)
I received results of patient's myeloma panel that was ordered during recent hospitalization.  It came back abnormal.  Phone call placed to patient today to discuss.  I left a message on his voicemail informing him that [facility] some abnormal lab results from his hospitalization.  He has an appointment with Dr. Margarita Rana tomorrow.  I recommend that he keeps that appointment to discuss these results.

## 2022-01-20 ENCOUNTER — Encounter: Payer: Self-pay | Admitting: Family Medicine

## 2022-01-20 ENCOUNTER — Ambulatory Visit: Payer: No Typology Code available for payment source | Attending: Family Medicine | Admitting: Family Medicine

## 2022-01-20 VITALS — BP 127/68 | HR 69 | Temp 97.7°F | Ht 67.0 in | Wt 180.0 lb

## 2022-01-20 DIAGNOSIS — Z794 Long term (current) use of insulin: Secondary | ICD-10-CM | POA: Diagnosis not present

## 2022-01-20 DIAGNOSIS — E1159 Type 2 diabetes mellitus with other circulatory complications: Secondary | ICD-10-CM | POA: Diagnosis not present

## 2022-01-20 DIAGNOSIS — Z952 Presence of prosthetic heart valve: Secondary | ICD-10-CM

## 2022-01-20 DIAGNOSIS — I779 Disorder of arteries and arterioles, unspecified: Secondary | ICD-10-CM

## 2022-01-20 DIAGNOSIS — E1169 Type 2 diabetes mellitus with other specified complication: Secondary | ICD-10-CM | POA: Diagnosis not present

## 2022-01-20 DIAGNOSIS — I152 Hypertension secondary to endocrine disorders: Secondary | ICD-10-CM

## 2022-01-20 DIAGNOSIS — D472 Monoclonal gammopathy: Secondary | ICD-10-CM | POA: Diagnosis not present

## 2022-01-20 DIAGNOSIS — I739 Peripheral vascular disease, unspecified: Secondary | ICD-10-CM | POA: Diagnosis not present

## 2022-01-20 NOTE — Patient Instructions (Signed)

## 2022-01-20 NOTE — Progress Notes (Signed)
Subjective:  Patient ID: Caleb [name], male    DOB: 10-May-1950  Age: 71 y.o. MRN: [id number]  CC: Hypertension   HPI Caleb [name] is a 71 y.o. year old male with a history of type 2 diabetes mellitus (A1c 8.2), hypertension, hyperlipidemia, s/p L 4th and 5th  toe metatarsal amputation, PAD status post b/l popliteal artery intervention, bilateral carotid artery stenosis status post bilateral  transcarotid artery revascularization (in 11/2019 and 01/2020), HFrEF (EF 20%, global hypokinesis, grade 2 DD, AVR aortic valve stenosis s/p TVAR from 10/2021 down from 50-55% previously) , b/l carotid stenosis (status post TCAR)  who presents today for a transition of care visit. He was hospitalized from 01/11/2022 through 01/14/2019 for transcatheter aortic valve replacement due to aortic valve stenosis after which she was placed on aspirin and Plavix but Plavix was held briefly due to bleeding from groin site post op plans to restart later in the week. Caleb [name] was also held due to hyperkalemia. Hydralazine and isosorbide were initiated due to elevated blood pressure. He also had multiple myeloma panel done which revealed elevated IgG levels, elevated kappa lambda free light chain  Interval History: Since discharge he has followed up with the cardiology NP and has resumed DAPT with aspirin and Plavix but Caleb [name] is still on hold.  Potassium at discharge was 4.4. He denies points of chest pain, dyspnea.  He has noticed his previous problem with erectile dysfunction has resolved and he is now having erections.   DM is managed by his Physician at the [place]. He does 8 units in the morning and 7 in the evening. Last visit with Endocrine was 09/2021 and his last A1c was 8.2.  He has no hypoglycemia. He also sees his PCP at the [place] where he had recent labs done earlier this week.  Denies additional concerns today. Past Medical History:  Diagnosis Date   Carotid artery occlusion    Diabetes mellitus without  complication ([place])    Type II   Dyslipidemia 09/27/2012   Erectile dysfunction 09/27/2012   GERD (gastroesophageal reflux disease)    Hypercholesteremia    Hyperlipidemia 08/12/2012   Hypertension    Hyponatremia 08/14/2012   Neuropathy    Pancreatitis    Pancreatitis, acute 09/27/2012   Peripheral vascular disease ([place])    S/P 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 Burt Knack & Bartle   Severe aortic stenosis    Vitamin D deficiency 06/23/2015    Past Surgical History:  Procedure Laterality Date   ABDOMINAL AORTOGRAM W/LOWER EXTREMITY N/A 08/08/2019   Procedure: ABDOMINAL AORTOGRAM W/LOWER EXTREMITY;  Surgeon: Lorretta Harp, MD;  Location: [facility];  Service: Cardiovascular;  Laterality: N/A;   ABDOMINAL AORTOGRAM W/LOWER EXTREMITY N/A 11/18/2019   Procedure: ABDOMINAL AORTOGRAM W/LOWER EXTREMITY;  Surgeon: Lorretta Harp, MD;  Location: [facility];  Service: Cardiovascular;  Laterality: N/A;   CARDIAC CATHETERIZATION     COLONOSCOPY  12 years ago   in [place] clinic= normal exam per pt   INTRAOPERATIVE TRANSTHORACIC ECHOCARDIOGRAM N/A 01/11/2022   Procedure: INTRAOPERATIVE TRANSTHORACIC ECHOCARDIOGRAM;  Surgeon: Sherren Mocha, MD;  Location: [facility] [place] [facility];  Service: Open Heart Surgery;  Laterality: N/A;   KNEE SURGERY     MULTIPLE EXTRACTIONS WITH ALVEOLOPLASTY N/A 12/09/2021   Procedure: MULTIPLE EXTRACTION;  Surgeon: Charlaine Dalton, DMD;  Location: [place];  Service: Dentistry;  Laterality: N/A;  PERIPHERAL VASCULAR INTERVENTION Right 08/08/2019   Procedure: PERIPHERAL VASCULAR INTERVENTION;  Surgeon: Lorretta Harp, MD;  Location: [facility] [facility];  Service: Cardiovascular;  Laterality: Right;   PERIPHERAL VASCULAR INTERVENTION Left 11/18/2019   Procedure: PERIPHERAL VASCULAR INTERVENTION;  Surgeon: Lorretta Harp, MD;  Location: [facility];  Service:  Cardiovascular;  Laterality: Left;  left popliteal artery   RIGHT/LEFT HEART CATH AND CORONARY ANGIOGRAPHY N/A 10/29/2021   Procedure: RIGHT/LEFT HEART CATH AND CORONARY ANGIOGRAPHY;  Surgeon: Burnell Blanks, MD;  Location: [facility] [facility];  Service: Cardiovascular;  Laterality: N/A;   TRANSCAROTID ARTERY REVASCULARIZATION  Left 12/16/2019   Procedure: TRANSCAROTID ARTERY REVASCULARIZATION;  Surgeon: Elam Dutch, MD;  Location: [facility];  Service: Vascular;  Laterality: Left;   TRANSCAROTID ARTERY REVASCULARIZATION  Right 02/03/2020   Procedure: RIGHT TRANSCAROTID ARTERY REVASCULARIZATION;  Surgeon: Elam Dutch, MD;  Location: [facility];  Service: Vascular;  Laterality: Right;   TRANSCATHETER AORTIC VALVE REPLACEMENT, TRANSFEMORAL N/A 01/11/2022   Procedure: Transcatheter Aortic Valve Replacement, Transfemoral;  Surgeon: Sherren Mocha, MD;  Location: [place] [facility];  Service: Open Heart Surgery;  Laterality: N/A;   ULTRASOUND GUIDANCE FOR VASCULAR ACCESS Right 12/16/2019   Procedure: ULTRASOUND GUIDANCE FOR VASCULAR ACCESS;  Surgeon: Elam Dutch, MD;  [facility];  Service: Vascular;  Laterality: Right;   ULTRASOUND GUIDANCE FOR VASCULAR ACCESS Right 02/03/2020   Procedure: ULTRASOUND GUIDANCE FOR VASCULAR ACCESS;  Surgeon: Elam Dutch, MD;  Location: [facility] OR;  Service: Vascular;  Laterality: Right;   VASECTOMY      Family History  Problem Relation Age of Onset   CAD Father    Hypertension Father    Alcohol abuse Father        Cause of death   Diabetes Mother    Colon polyps Mother    CAD Brother 60       CABG   Colon cancer Neg Hx    Esophageal cancer Neg Hx    Rectal cancer Neg Hx    Stomach cancer Neg Hx     Social History   Socioeconomic History   Marital status: Widowed    Spouse name: Not on file   Number of children: 3   Years of education: Not on file   Highest education level: High school graduate  Occupational History    Occupation: Caregiver    Comment: Special needs    Comment: retired  Tobacco Use   Smoking status: Never   Smokeless tobacco: Never  Vaping Use   Vaping Use: Never used  Substance and Sexual Activity   Alcohol use: Yes    Comment: occasional   Drug use: No   Sexual activity: Not Currently  Other Topics Concern   Not on file  Social History Narrative   Widower.  3 daughters and 3 grandchildren.     Social Determinants of Health   Financial Resource Strain: Low Risk  (10/29/2021)   Overall Financial Resource Strain (CARDIA)    Difficulty of Paying Living Expenses: Not hard at all  Food Insecurity: No Food Insecurity (01/14/2022)   Hunger Vital Sign    Worried About Running Out of Food in the Last Year: Never true    Ran Out of Food in the Last Year: Never true  Transportation Needs: No Transportation Needs (01/14/2022)   PRAPARE - Hydrologist (Medical): No    Lack of Transportation (Non-Medical): No  Physical Activity: Sufficiently Active (12/06/2020)  Exercise Vital Sign    Days of Exercise per Week: 4 days    Minutes of Exercise per Session: 60 min  Stress: No Stress Concern Present (12/06/2020)   [place]    Feeling of Stress : Not at all  Social Connections: Moderately Isolated (12/06/2020)   Social Connection and Isolation Panel [NHANES]    Frequency of Communication with Friends and Family: More than three times a week    Frequency of Social Gatherings with Friends and Family: More than three times a week    Attends Religious Services: More than 4 times per year    Active Member of [employer] or Organizations: No    Attends Archivist Meetings: Never    Marital Status: Widowed    Allergies  Allergen Reactions   Codeine Nausea And Vomiting   Percocet [Oxycodone-Acetaminophen] Nausea And Vomiting    Outpatient Medications Prior to Visit  Medication Sig Dispense  Refill   amoxicillin (AMOXIL) 500 MG capsule Take 4 capsules (2,000 mg total) by mouth as directed. Take 4 tablets 1 hour prior to dental work, including cleanings. 12 capsule 12   aspirin EC 81 MG tablet Take 81 mg by mouth in the morning.     atorvastatin (LIPITOR) 40 MG tablet Take 40 mg by mouth in the morning.     carvedilol (COREG) 12.5 MG tablet Take 1 tablet (12.5 mg total) by mouth 2 (two) times daily with a meal. 60 tablet 0   Empagliflozin-metFORMIN HCl ER (SYNJARDY XR) 25-1000 MG TB24 Take 1 tablet by mouth in the morning.     ferrous sulfate 325 (65 FE) MG tablet Take 325 mg by mouth in the morning.     furosemide (LASIX) 40 MG tablet Take 1 tablet (40 mg total) by mouth daily. 30 tablet 0   gabapentin (NEURONTIN) 800 MG tablet Take 800 mg by mouth at bedtime as needed (Sleep).     hydrALAZINE (APRESOLINE) 25 MG tablet Take 1 tablet (25 mg total) by mouth 3 (three) times daily. 90 tablet 3   insulin NPH-regular Human (NOVOLIN 70/30) (70-30) 100 UNIT/ML injection Inject 12 Units into the skin 2 (two) times daily with a meal. (Patient taking differently: Inject 7-8 Units into the skin See admin instructions. Take 7 units in the morning and 8 units at dinner) 20 mL 6   isosorbide mononitrate (IMDUR) 30 MG 24 hr tablet Take 1 tablet (30 mg total) by mouth daily. 30 tablet 3   PRESCRIPTION MEDICATION Inject 0.1-1 mLs as directed See admin instructions. Tri-Mix Standard Strength 5 ml. Formula: Prostaglandin 10 mcg/ml, Papaverine 30 mg/ml, Phentolamine 1 mg/ml. Inject 0.1 ml into side of penis as directed as needed (to be injected immediately before sexual intercourse) may increase the dose by 0.1 ml every 48 hours to achieve an erection. Max dose 1 ml.     vitamin B-12 (CYANOCOBALAMIN) 500 MCG tablet Take 500 mcg by mouth in the morning.     Facility-Administered Medications Prior to Visit  Medication Dose Route Frequency Provider Last Rate Last Admin   sodium chloride flush (NS) 0.9 %  injection 3 mL  3 mL Intravenous Q12H Lorretta Harp, MD       sodium chloride flush (NS) 0.9 % injection 3 mL  3 mL Intravenous Q12H Lorretta Harp, MD         ROS Review of Systems  Constitutional:  Negative for activity change and appetite change.  HENT:  Negative for sinus pressure and sore throat.   Respiratory:  Negative for chest tightness, shortness of breath and wheezing.   Cardiovascular:  Negative for chest pain and palpitations.  Gastrointestinal:  Negative for abdominal distention, abdominal pain and constipation.  Genitourinary: Negative.   Musculoskeletal: Negative.   Psychiatric/Behavioral:  Negative for behavioral problems and dysphoric mood.     Objective:  BP 127/68   Pulse 69   Temp 97.7 F (36.5 C) (Oral)   Ht _0  (1.702 m)   Wt 180 lb (81.6 kg)   SpO2 98%   BMI 28.19 kg/m      01/20/2022   10:45 AM 01/17/2022   11:41 AM 01/13/2022    5:00 AM  BP/Weight  Systolic BP 250 539   Diastolic BP 68 80   Wt. (Lbs) 180 180 176.37  BMI 28.19 kg/m2 28.19 kg/m2 27.62 kg/m2      Physical Exam Constitutional:      Appearance: He is well-developed.  Cardiovascular:     Rate and Rhythm: Normal rate.     Heart sounds: Normal heart sounds. No murmur heard. Pulmonary:     Effort: Pulmonary effort is normal.     Breath sounds: Normal breath sounds. No wheezing or rales.  Chest:     Chest wall: No tenderness.  Abdominal:     General: Bowel sounds are normal. There is no distension.     Palpations: Abdomen is soft. There is no mass.     Tenderness: There is no abdominal tenderness.  Musculoskeletal:        General: Normal range of motion.     Right lower leg: No edema.     Left lower leg: No edema.  Neurological:     Mental Status: He is alert and oriented to person, place, and time.  Psychiatric:        Mood and Affect: Mood normal.        Latest Ref Rng & Units 01/13/2022    1:21 AM 01/12/2022    3:57 AM 01/11/2022    3:30 PM  CMP   Glucose 70 - 99 mg/dL 235  200  144   BUN 8 - 23 mg/dL _1 Creatinine 0.61 - 1.24 mg/dL 1.66  1.48  1.10   Sodium 135 - 145 mmol/L 134  134  138   Potassium 3.5 - 5.1 mmol/L 4.4  5.1  4.7   Chloride 98 - 111 mmol/L 99  101  103   CO2 22 - 32 mmol/L 24  25    Calcium 8.9 - 10.3 mg/dL 8.8  8.7      Lipid Panel     Component Value Date/Time   CHOL 106 06/05/2020 0947   TRIG 138 06/05/2020 0947   HDL 53 06/05/2020 0947   CHOLHDL 2.0 06/05/2020 0947   CHOLHDL 3.9 04/02/2013 1632   VLDL 66 (H) 04/02/2013 1632   LDLCALC 29 06/05/2020 0947    CBC    Component Value Date/Time   WBC 6.9 01/13/2022 0121   RBC 5.19 01/13/2022 0121   HGB 14.2 01/13/2022 0121   HGB 13.3 11/15/2019 1139   HCT 43.4 01/13/2022 0121   HCT 40.2 11/15/2019 1139   PLT 207 01/13/2022 0121   PLT 300 11/15/2019 1139   MCV 83.6 01/13/2022 0121   MCV 86 11/15/2019 1139   MCH 27.4 01/13/2022 0121   MCHC 32.7 01/13/2022 0121   RDW 14.9 01/13/2022 0121   RDW 14.5 11/15/2019  1139   LYMPHSABS 2.1 10/27/2021 1707   LYMPHSABS 3.3 (H) 09/28/2017 1113   MONOABS 0.6 10/27/2021 1707   EOSABS 0.2 10/27/2021 1707   EOSABS 0.3 09/28/2017 1113   BASOSABS 0.0 10/27/2021 1707   BASOSABS 0.0 09/28/2017 1113    Lab Results  Component Value Date   HGBA1C 8.2 (H) 01/07/2022    Assessment & Plan:  1. Hypertension associated with diabetes ([place]) Controlled Continue Coreg, hydralazine, isosorbide Counseled on blood pressure goal of less than 130/80, low-sodium, DASH diet, medication compliance, 150 minutes of moderate intensity exercise per week. Discussed medication compliance, adverse effects.  2. S/P TAVR (transcatheter aortic valve replacement) Doing well. Stable, asymptomatic Follow-up with cardiothoracic surgeon  3. Type 2 diabetes mellitus with other specified complication, with long-term current use of insulin (HCC) Uncontrolled with A1c of 8.2 He is currently on Novolin 70/30, Synjardy  4.  Peripheral vascular disease (HCC) Asymptomatic Risk factor modification  5. Bilateral carotid artery disease, unspecified type ([place]) Status post TCAR Asymptomatic Continue high intensity statin, Plavix and aspirin  6. Monoclonal gammopathy Discovered on discharge labs Myeloma panel and FLC assay is suspicious for multiple myeloma - Ambulatory referral to Hematology / Oncology    No orders of the defined types were placed in this encounter.   Follow-up: Return in about 3 months (around 04/22/2022) for Chronic medical conditions.       [name], MD, FAAFP. [facility] [place], [place]   01/20/2022, 1:56 PM

## 2022-01-20 NOTE — Addendum Note (Signed)
Addended by: Maren Beach, Lilias Lorensen A on: 01/20/2022 08:35 AM   Modules accepted: Orders

## 2022-01-22 LAB — BPAM RBC
Blood Product Expiration Date: 202311192359
Blood Product Expiration Date: 202311192359
Unit Type and Rh: 5100
Unit Type and Rh: 5100

## 2022-01-22 LAB — TYPE AND SCREEN
ABO/RH(D): O POS
Antibody Screen: POSITIVE
DAT, IgG: NEGATIVE
Unit division: 0
Unit division: 0

## 2022-01-24 ENCOUNTER — Telehealth: Payer: Self-pay | Admitting: Hematology and Oncology

## 2022-01-24 NOTE — Telephone Encounter (Signed)
Scheduled appointment per 11/01 referral . Patient is aware of appointment date and time. Patient is aware to arrive 15 mins prior to appointment time and to bring updated insurance cards. Patient is aware of location.

## 2022-01-25 ENCOUNTER — Ambulatory Visit: Payer: Self-pay

## 2022-01-25 NOTE — Patient Outreach (Signed)
  Care Coordination   01/25/2022 Name: Caleb [name] MRN: [id number] DOB: 1950/08/04   Care Coordination Outreach Attempts:  An unsuccessful telephone outreach was attempted for a scheduled appointment today.  Follow Up Plan:  Additional outreach attempts will be made to offer the patient care coordination information and services.   Encounter Outcome:  No Answer  Care Coordination Interventions Activated:  No   Care Coordination Interventions:  No, not indicated    Barb Merino, RN, BSN, CCM Care Management Coordinator [facility] Care Management Direct Phone: [phone]

## 2022-02-04 ENCOUNTER — Encounter (HOSPITAL_COMMUNITY): Payer: Medicare HMO | Admitting: Cardiology

## 2022-02-14 ENCOUNTER — Ambulatory Visit: Payer: Medicare HMO | Admitting: Internal Medicine

## 2022-02-14 NOTE — Progress Notes (Deleted)
Name: Caleb [name]  MRN/ DOB: [id number], [birth year]   Age/ Sex: 71 y.o., male    PCP: [name], MD   Reason for Endocrinology Evaluation: Type 2 Diabetes Mellitus     Date of Initial Endocrinology Visit: 06/30/2021    PATIENT IDENTIFIER: [name] is a 71 y.o. male with a past medical history of HTN, PVD, T2DM, carotid artery disease,Hx acute pancreatitis (2014) and dyslipidemia. The patient presented for initial endocrinology clinic visit on 06/30/2021 for consultative assistance with his diabetes management.    HPI: [name] was    Diagnosed with DM years ago Prior Medications tried/Intolerance: has been on insulin for a while  Hemoglobin A1c has ranged from 7.6% in 2020, peaking at 8.2% in 2022.  [facility] as well as fingers    SUBJECTIVE:   During the last visit (09/29/2021): A1c 8.0%   Today (02/14/22): Caleb [name] is here for a follow up on diabetes management. He checks his blood sugars multiple times daily through freestyle libre . The patient has not had hypoglycemic episodes since the last clinic visit.   He presented to the ED with CHF episode in September 2023 Patient is s/p transcatheter aortic valve replacement 12/2021  Patient has a pending evaluation by oncology for suspected multiple myeloma  He is pending penile implant but his A1c has to be at 7 %   HOME DIABETES REGIMEN: Synjardy 12.07-998 mg daily  NovoLin  mix 7 units before breakfast and 8 units before Supper     Statin: Yes ACE-I/ARB: No     CONTINUOUS GLUCOSE MONITORING RECORD INTERPRETATION    Dates of Recording: 6/29-7/02/2022  Sensor description:freestyle libre  Results statistics:   CGM use % of time 94  Average and SD 148/24.8  Time in range     81 %  % Time Above 180 18  % Time above 250 1  % Time Below target 0    Glycemic patterns summary: hyperglycemia noted postprandial, BG's optimal at night and between meals    Hyperglycemic episodes  postprandial  Hypoglycemic episodes occurred n/a  Overnight periods: optimal     DIABETIC COMPLICATIONS: Microvascular complications:  Peripheral neuropathy Denies: CKD Last eye exam: Completed 12/14/2021- Dr. Katy Fitch  Macrovascular complications:  PVD Denies: CAD, CVA   PAST HISTORY: Past Medical History:  Past Medical History:  Diagnosis Date   Carotid artery occlusion    Diabetes mellitus without complication ([place])    Type II   Dyslipidemia 09/27/2012   Erectile dysfunction 09/27/2012   GERD (gastroesophageal reflux disease)    Hypercholesteremia    Hyperlipidemia 08/12/2012   Hypertension    Hyponatremia 08/14/2012   Neuropathy    Pancreatitis    Pancreatitis, acute 09/27/2012   Peripheral vascular disease ([facility])    S/P 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 Burt Knack & Bartle   Severe aortic stenosis    Vitamin D deficiency 06/23/2015   Past Surgical History:  Past Surgical History:  Procedure Laterality Date   ABDOMINAL AORTOGRAM W/LOWER EXTREMITY N/A 08/08/2019   Procedure: ABDOMINAL AORTOGRAM W/LOWER EXTREMITY;  Surgeon: Lorretta Harp, MD;  Location: [facility];  Service: Cardiovascular;  Laterality: N/A;   ABDOMINAL AORTOGRAM W/LOWER EXTREMITY N/A 11/18/2019   Procedure: ABDOMINAL AORTOGRAM W/LOWER EXTREMITY;  Surgeon: Lorretta Harp, MD;  Location: [facility];  Service: Cardiovascular;  Laterality: N/A;  CARDIAC CATHETERIZATION     COLONOSCOPY  12 years ago   in [place] clinic= normal exam per pt   INTRAOPERATIVE TRANSTHORACIC ECHOCARDIOGRAM N/A 01/11/2022   Procedure: INTRAOPERATIVE TRANSTHORACIC ECHOCARDIOGRAM;  Surgeon: Sherren Mocha, MD;  Location: [facility];  Service: Open Heart Surgery;  Laterality: N/A;   KNEE SURGERY     MULTIPLE EXTRACTIONS WITH ALVEOLOPLASTY N/A 12/09/2021   Procedure: MULTIPLE EXTRACTION;   Surgeon: Charlaine Dalton, DMD;  Location: [facility];  Service: Dentistry;  Laterality: N/A;   PERIPHERAL VASCULAR INTERVENTION Right 08/08/2019   Procedure: PERIPHERAL VASCULAR INTERVENTION;  Surgeon: Lorretta Harp, MD;  Location: [facility];  Service: Cardiovascular;  Laterality: Right;   PERIPHERAL VASCULAR INTERVENTION Left 11/18/2019   Procedure: PERIPHERAL VASCULAR INTERVENTION;  Surgeon: Lorretta Harp, MD;  Location: [facility];  Service: Cardiovascular;  Laterality: Left;  left popliteal artery   RIGHT/LEFT HEART CATH AND CORONARY ANGIOGRAPHY N/A 10/29/2021   Procedure: RIGHT/LEFT HEART CATH AND CORONARY ANGIOGRAPHY;  Surgeon: Burnell Blanks, MD;  Location: [facility];  Service: Cardiovascular;  Laterality: N/A;   TRANSCAROTID ARTERY REVASCULARIZATION  Left 12/16/2019   Procedure: TRANSCAROTID ARTERY REVASCULARIZATION;  Surgeon: Elam Dutch, MD;  Location: [facility];  Service: Vascular;  Laterality: Left;   TRANSCAROTID ARTERY REVASCULARIZATION  Right 02/03/2020   Procedure: RIGHT TRANSCAROTID ARTERY REVASCULARIZATION;  Surgeon: Elam Dutch, MD;  Location: [facility];  Service: Vascular;  Laterality: Right;   TRANSCATHETER AORTIC VALVE REPLACEMENT, TRANSFEMORAL N/A 01/11/2022   Procedure: Transcatheter Aortic Valve Replacement, Transfemoral;  Surgeon: Sherren Mocha, MD;  Location: [facility];  Service: Open Heart Surgery;  Laterality: N/A;   ULTRASOUND GUIDANCE FOR VASCULAR ACCESS Right 12/16/2019   Procedure: ULTRASOUND GUIDANCE FOR VASCULAR ACCESS;  Surgeon: Elam Dutch, MD;  Location: [facility];  Service: Vascular;  Laterality: Right;   ULTRASOUND GUIDANCE FOR VASCULAR ACCESS Right 02/03/2020   Procedure: ULTRASOUND GUIDANCE FOR VASCULAR ACCESS;  Surgeon: Elam Dutch, MD;  Location: [facility];  Service: Vascular;  Laterality: Right;   VASECTOMY      Social History:  reports that he has never smoked. He has never used smokeless  tobacco. He reports current alcohol use. He reports that he does not use drugs. Family History:  Family History  Problem Relation Age of Onset   CAD Father    Hypertension Father    Alcohol abuse Father        Cause of death   Diabetes Mother    Colon polyps Mother    CAD Brother 42       CABG   Colon cancer Neg Hx    Esophageal cancer Neg Hx    Rectal cancer Neg Hx    Stomach cancer Neg Hx      HOME MEDICATIONS: Allergies as of 02/14/2022       Reactions   Codeine Nausea And Vomiting   Percocet [oxycodone-acetaminophen] Nausea And Vomiting        Medication List        Accurate as of February 14, 2022  7:05 AM. If you have any questions, ask your nurse or doctor.          amoxicillin 500 MG capsule Commonly known as: AMOXIL Take 4 capsules (2,000 mg total) by mouth as directed. Take 4 tablets 1 hour prior to dental work, including cleanings.   aspirin EC 81 MG tablet Take 81 mg by mouth in the morning.   atorvastatin 40  MG tablet Commonly known as: LIPITOR Take 40 mg by mouth in the morning.   carvedilol 12.5 MG tablet Commonly known as: COREG Take 1 tablet (12.5 mg total) by mouth 2 (two) times daily with a meal.   cyanocobalamin 500 MCG tablet Commonly known as: VITAMIN B12 Take 500 mcg by mouth in the morning.   ferrous sulfate 325 (65 FE) MG tablet Take 325 mg by mouth in the morning.   furosemide 40 MG tablet Commonly known as: LASIX Take 1 tablet (40 mg total) by mouth daily.   gabapentin 800 MG tablet Commonly known as: NEURONTIN Take 800 mg by mouth at bedtime as needed (Sleep).   hydrALAZINE 25 MG tablet Commonly known as: APRESOLINE Take 1 tablet (25 mg total) by mouth 3 (three) times daily.   isosorbide mononitrate 30 MG 24 hr tablet Commonly known as: IMDUR Take 1 tablet (30 mg total) by mouth daily.   NovoLIN 70/30 (70-30) 100 UNIT/ML injection Generic drug: insulin NPH-regular Human Inject 12 Units into the skin 2 (two)  times daily with a meal. What changed:  how much to take when to take this additional instructions   PRESCRIPTION MEDICATION Inject 0.1-1 mLs as directed See admin instructions. Tri-Mix Standard Strength 5 ml. Formula: Prostaglandin 10 mcg/ml, Papaverine 30 mg/ml, Phentolamine 1 mg/ml. Inject 0.1 ml into side of penis as directed as needed (to be injected immediately before sexual intercourse) may increase the dose by 0.1 ml every 48 hours to achieve an erection. Max dose 1 ml.   Synjardy XR 25-1000 MG Tb24 Generic drug: Empagliflozin-metFORMIN HCl ER Take 1 tablet by mouth in the morning.         ALLERGIES: Allergies  Allergen Reactions   Codeine Nausea And Vomiting   Percocet [Oxycodone-Acetaminophen] Nausea And Vomiting        OBJECTIVE:   VITAL SIGNS: There were no vitals taken for this visit.   PHYSICAL EXAM:  General: Pt appears well and is in NAD  Neck: General: Supple without adenopathy or carotid bruits. Thyroid: Thyroid size normal.  No goiter or nodules appreciated.   Lungs: Clear with good BS bilat with no rales, rhonchi, or wheezes  Heart: RRR, + systolic murmur   Abdomen: Normoactive bowel sounds, soft, nontender, without masses or organomegaly palpable  Extremities:  Lower extremities - No pretibial edema. No lesions.  Neuro: MS is good with appropriate affect, pt is alert and Ox3    DM foot exam: 06/30/2021  The skin of the feet is without sores or ulcerations. Pt with left 4th and 5th toe amputations  The pedal pulses are undetectable  The sensation is decreased to a screening 5.07, 10 gram monofilament bilaterally   DATA REVIEWED:  Lab Results  Component Value Date   HGBA1C 8.2 (H) 01/07/2022   HGBA1C 8.0 (A) 09/29/2021   HGBA1C 7.7 (A) 01/19/2021   Lab Results  Component Value Date   CHOL 106 06/05/2020   HDL 53 06/05/2020   LDLCALC 29 06/05/2020   TRIG 138 06/05/2020   CHOLHDL 2.0 06/05/2020         Latest Reference Range & Units  09/29/21 99:35  BASIC METABOLIC PANEL  Rpt !  Sodium 135 - 145 mEq/L 137  Potassium 3.5 - 5.1 mEq/L 3.9  Chloride 96 - 112 mEq/L 104  CO2 19 - 32 mEq/L 25  Glucose 70 - 99 mg/dL 195 (H)  BUN 6 - 23 mg/dL 21  Creatinine 0.40 - 1.50 mg/dL 1.39  Calcium 8.4 -  10.5 mg/dL 9.3  GFR >60.00 mL/min 51.00 (L)     ASSESSMENT / PLAN / RECOMMENDATIONS:   1) Type 2 Diabetes Mellitus, Poorly controlled, With macrovascular complications - Most recent A1c of 8.0 %. Goal A1c <7.0%.      - Despite no improvement in his A1c but his CGM download shows great glycemic control with 81% of readings at goal  - His [facility] switched Metformin and Jardiance to [place] but at a smaller dose with the pt taking 50 % of metformin than previously prescribed  - Pt with Hx of Pancreatitis, he is not a candidate DPP-4 inhibitors, GLP-1 agonists and Tirzeptide - GFR at 51. Pt may take metformin 2000 mg a day     MEDICATIONS: Change Synjardy 12.07-998 mg , 2 tabs daily  Change Novolin Mix 7 units Before breakfast and 8 units before Supper   EDUCATION / INSTRUCTIONS: BG monitoring instructions: Patient is instructed to check his blood sugars 3 times a day, before meals . Call [facility] if: BG persistently < 70  I reviewed the Rule of 15 for the treatment of hypoglycemia in detail with the patient. Literature supplied.   2) Diabetic complications:  Eye: Does not have known diabetic retinopathy.  Neuro/ Feet: Does  have known diabetic peripheral neuropathy. Renal: Patient does not have known baseline CKD. He is not on an ACEI/ARB at present.   F/U in 4 months     Signed electronically by: Mack Guise, MD  [facility]  [facility] [address] [place], [place] [address] Phone: [phone] FAX: [phone]   CC: [name], MD [place] [address] Phone: [phone]  Fax:  [phone]    Return to Endocrinology clinic as below: Future Appointments  Date Time Provider [place]  02/14/2022  9:10 AM Shama Monfils, Melanie Crazier, MD [facility] None  02/15/2022  3:45 PM Nicholas Lose, MD [facility] None  02/15/2022  4:15 PM [facility] LAB [facility] None  02/16/2022  1:55 PM [facility] ECHO 4 [facility]  02/16/2022  3:00 PM [facility] STRUCTURAL HEART APP [facility]  04/25/2022 10:10 AM [name], MD [facility] None  05/30/2022 10:00 AM [facility] NL VASC 2 [facility]I [facility]

## 2022-02-15 ENCOUNTER — Inpatient Hospital Stay: Payer: Medicare HMO

## 2022-02-15 ENCOUNTER — Inpatient Hospital Stay: Payer: Medicare HMO | Attending: Hematology and Oncology | Admitting: Hematology and Oncology

## 2022-02-15 DIAGNOSIS — D472 Monoclonal gammopathy: Secondary | ICD-10-CM | POA: Insufficient documentation

## 2022-02-15 NOTE — Assessment & Plan Note (Deleted)
Lab review 11/12/2021: IgG 03/28/2003, M protein not observed, IFE: IgG kappa 01/12/2022: IgG 7035, M protein: Not observed, IFE: IgG kappa  Counseling: I discussed with the patient the spectrum of disorders from MGUS to multiple myeloma. We discussed the role of plasma cells in producing immunoglobulins. We discussed structure of immunoglobulins on how they make up the heavy chains and the light chains. The light chains are Kappa and lambda. I discussed the difference between MGUS and multiple myeloma. MGUS is characterized by elevation monoclonal protein without any end organ damage. Multiple myeloma is associated with elevation monoclonal protein and end organ damage (hypercalcemia, renal dysfunction, anemia, bone lytic lesions ) along with a bone marrow showing greater than 10% plasma cells.  Workup recommended: 1. CBC with differential and CMP 2. serum protein electrophoresis with immunofixation in 1 year 3. Bone survey

## 2022-02-16 ENCOUNTER — Ambulatory Visit (HOSPITAL_COMMUNITY): Payer: Medicare HMO | Attending: Cardiovascular Disease | Admitting: Physician Assistant

## 2022-02-16 ENCOUNTER — Ambulatory Visit (HOSPITAL_BASED_OUTPATIENT_CLINIC_OR_DEPARTMENT_OTHER): Payer: Medicare HMO

## 2022-02-16 VITALS — BP 138/100 | HR 66 | Ht 67.0 in | Wt 177.8 lb

## 2022-02-16 DIAGNOSIS — I5022 Chronic systolic (congestive) heart failure: Secondary | ICD-10-CM | POA: Diagnosis not present

## 2022-02-16 DIAGNOSIS — I10 Essential (primary) hypertension: Secondary | ICD-10-CM | POA: Diagnosis not present

## 2022-02-16 DIAGNOSIS — I739 Peripheral vascular disease, unspecified: Secondary | ICD-10-CM | POA: Insufficient documentation

## 2022-02-16 DIAGNOSIS — N1832 Chronic kidney disease, stage 3b: Secondary | ICD-10-CM | POA: Insufficient documentation

## 2022-02-16 DIAGNOSIS — Z952 Presence of prosthetic heart valve: Secondary | ICD-10-CM

## 2022-02-16 DIAGNOSIS — E875 Hyperkalemia: Secondary | ICD-10-CM | POA: Insufficient documentation

## 2022-02-16 LAB — ECHOCARDIOGRAM COMPLETE
AR max vel: 2.36 cm2
AV Area VTI: 2.41 cm2
AV Area mean vel: 2.19 cm2
AV Mean grad: 8.5 mmHg
AV Peak grad: 15.8 mmHg
Ao pk vel: 1.99 m/s
Area-P 1/2: 2.79 cm2
S' Lateral: 3.3 cm

## 2022-02-16 NOTE — Progress Notes (Unsigned)
[facility] [place]                                     Cardiology Office Note:    Date:  02/17/2022   ID:  Caleb [name], DOB 1951/02/08, MRN [id number]  PCP:  [name], MD  [facility] Cardiologist:  Quay Burow, MD  / Dr. Burt Knack, MD & Dr. Cyndia Bent MD (TAVR) [facility] Electrophysiologist:  None   Referring MD: [name], MD   1 month s/p TAVR  History of Present Illness:    Caleb [name] is a 71 y.o. male with a hx of HTN, DMT2, CKD stage IIIa, PAD s/p previous bilateral popliteal artery stenting, carotid artery disease s/p bilateral TCARs, NICM, chronic combined S/D CHF (EF 20%) with recent admission, and severe LFLG AS s/p TAVR (01/11/22) who presents to clinic for follow up.    Caleb [name] is followed by Dr. Gwenlyn Found for his cardiology and PAD care. He was initially referred to cardiology service for evaluation of critical limb ischemia.  He underwent peripheral angiography in 07/2019 and underwent drug-coated balloon angioplasty for high-grade right SFA and the popliteal stenosis with two-vessel runoff. Carotid Doppler showed high-grade right greater than left ICA stenosis, he was referred to vascular surgery. Echocardiogram in 08/2019 showed EF had dropped down to 40 to 45% with moderate to severe aortic stenosis. Myoview obtained on 08/30/2019 that showed inferior scar without ischemia. Due to tingling in the left fourth toe, he underwent repeat lower extremity angiography that revealed occluded left popliteal artery with occluded posterior tibial and peroneal with high-grade AT disease, he underwent left popliteal CTO intervention on 11/18/2019 with self-expanding stent. He underwent left fourth and fifth toe amputation by Dr. Posey Pronto on 11/22/2019. Repeat echocardiogram obtained on 12/21/2020 showed EF improved to 50-55%, grade 1 DD, and moderate aortic stenosis.   He was admitted 10/2021 for acute CHF.  Repeat echocardiogram  at that time showed EF was down to 20%, with global hypokinesis, grade 2 DD, moderately reduced RV systolic function, trivial MR, mild to moderate AI, and low-flow low gradient aortic valve stenosis which was at least moderate with an SVI at 19, DI at 0.27, mean gradient at 12.15mHg, and peak at 22.738mg. Cardiac catheterization on 10/29/2021 showed mild nonobstructive disease in the mid and distal LAD and elevated right heart pressures. He was treated with Lasix with improvement. After discharge, he was seen by AHF team for amyloid workup with plans for myeloma panel and PYP scan. Given poor dentition, he underwent dental extractions 12/09/21.   The patient was evaluated by the multidisciplinary valve team and underwent successful TAVR with a 26 mm Edwards Sapien 3 THV via the TF approach on 01/11/22. Post operative echo showed EF 20-25% with stable valve placement with mean gradient at 5.39m28m, EOA 2.34cm2, and DI at 0.48. He had some post-operative groin oozing therefore his Plavix was held briefly.   Today the patient presents to clinic for follow up. He has noticed a big difference since TAVR. He is back at the gym now and doing great. He is able to exercise wtihout limitation but still not back to running. Can walk 45 minutes with no issues. No CP or SOB. No LE edema, orthopnea or PND. No dizziness or syncope. No blood in stool or urine. No palpitations.   Past Medical History:  Diagnosis Date  Carotid artery occlusion    Diabetes mellitus without complication (HCC)    Type II   Dyslipidemia 09/27/2012   Erectile dysfunction 09/27/2012   GERD (gastroesophageal reflux disease)    Hypercholesteremia    Hyperlipidemia 08/12/2012   Hypertension    Hyponatremia 08/14/2012   Neuropathy    Pancreatitis    Pancreatitis, acute 09/27/2012   Peripheral vascular disease ([place])    S/P TAVR (transcatheter aortic valve replacement) 01/11/2022   s/p TAVR with a 26 mm Edwards S3UR via the TF approach by  Dr. Burt Knack & Bartle   Severe aortic stenosis    Vitamin D deficiency 06/23/2015    Past Surgical History:  Procedure Laterality Date   ABDOMINAL AORTOGRAM W/LOWER EXTREMITY N/A 08/08/2019   Procedure: ABDOMINAL AORTOGRAM W/LOWER EXTREMITY;  Surgeon: Lorretta Harp, MD;  Location: [place] [facility];  Service: Cardiovascular;  Laterality: N/A;   ABDOMINAL AORTOGRAM W/LOWER EXTREMITY N/A 11/18/2019   Procedure: ABDOMINAL AORTOGRAM W/LOWER EXTREMITY;  Surgeon: Lorretta Harp, MD;  Location: [place] [facility];  Service: Cardiovascular;  Laterality: N/A;   CARDIAC CATHETERIZATION     COLONOSCOPY  12 years ago   in [place] clinic= normal exam per pt   INTRAOPERATIVE TRANSTHORACIC ECHOCARDIOGRAM N/A 01/11/2022   Procedure: INTRAOPERATIVE TRANSTHORACIC ECHOCARDIOGRAM;  Surgeon: Sherren Mocha, MD;  Location: [place] [facility];  Service: Open Heart Surgery;  Laterality: N/A;   KNEE SURGERY     MULTIPLE EXTRACTIONS WITH ALVEOLOPLASTY N/A 12/09/2021   Procedure: MULTIPLE EXTRACTION;  Surgeon: Charlaine Dalton, DMD;  Location: [place];  Service: Dentistry;  Laterality: N/A;   PERIPHERAL VASCULAR INTERVENTION Right 08/08/2019   Procedure: PERIPHERAL VASCULAR INTERVENTION;  Surgeon: Lorretta Harp, MD;  Location: [place] [facility];  Service: Cardiovascular;  Laterality: Right;   PERIPHERAL VASCULAR INTERVENTION Left 11/18/2019   Procedure: PERIPHERAL VASCULAR INTERVENTION;  Surgeon: Lorretta Harp, MD;  Location: [place] [facility];  Service: Cardiovascular;  Laterality: Left;  left popliteal artery   RIGHT/LEFT HEART CATH AND CORONARY ANGIOGRAPHY N/A 10/29/2021   Procedure: RIGHT/LEFT HEART CATH AND CORONARY ANGIOGRAPHY;  Surgeon: Burnell Blanks, MD;  Location: [place] [facility];  Service: Cardiovascular;  Laterality: N/A;   TRANSCAROTID ARTERY REVASCULARIZATION  Left 12/16/2019   Procedure: TRANSCAROTID ARTERY REVASCULARIZATION;  Surgeon: Elam Dutch, MD;  Location: [place];   Service: Vascular;  Laterality: Left;   TRANSCAROTID ARTERY REVASCULARIZATION  Right 02/03/2020   Procedure: RIGHT TRANSCAROTID ARTERY REVASCULARIZATION;  Surgeon: Elam Dutch, MD;  Location: [place];  Service: Vascular;  Laterality: Right;   TRANSCATHETER AORTIC VALVE REPLACEMENT, TRANSFEMORAL N/A 01/11/2022   Procedure: Transcatheter Aortic Valve Replacement, Transfemoral;  Surgeon: Sherren Mocha, MD;  Location: [place] [facility];  Service: Open Heart Surgery;  Laterality: N/A;   ULTRASOUND GUIDANCE FOR VASCULAR ACCESS Right 12/16/2019   Procedure: ULTRASOUND GUIDANCE FOR VASCULAR ACCESS;  Surgeon: Elam Dutch, MD;  Location: [place];  Service: Vascular;  Laterality: Right;   ULTRASOUND GUIDANCE FOR VASCULAR ACCESS Right 02/03/2020   Procedure: ULTRASOUND GUIDANCE FOR VASCULAR ACCESS;  Surgeon: Elam Dutch, MD;  Location: [facility];  Service: Vascular;  Laterality: Right;   VASECTOMY      Current Medications: Current Meds  Medication Sig   amoxicillin (AMOXIL) 500 MG capsule Take 4 capsules (2,000 mg total) by mouth as directed. Take 4 tablets 1 hour prior to dental work, including cleanings.   aspirin EC 81 MG tablet Take 81 mg by mouth in the morning.  atorvastatin (LIPITOR) 40 MG tablet Take 40 mg by mouth in the morning.   carvedilol (COREG) 12.5 MG tablet Take 1 tablet (12.5 mg total) by mouth 2 (two) times daily with a meal.   Empagliflozin-metFORMIN HCl ER (SYNJARDY XR) 25-1000 MG TB24 Take 1 tablet by mouth in the morning.   ferrous sulfate 325 (65 FE) MG tablet Take 325 mg by mouth in the morning.   furosemide (LASIX) 40 MG tablet Take 1 tablet (40 mg total) by mouth daily.   gabapentin (NEURONTIN) 800 MG tablet Take 800 mg by mouth at bedtime as needed (Sleep).   hydrALAZINE (APRESOLINE) 25 MG tablet Take 1 tablet (25 mg total) by mouth 3 (three) times daily.   insulin NPH-regular Human (NOVOLIN 70/30) (70-30) 100 UNIT/ML injection Inject 12 Units into the skin 2  (two) times daily with a meal. (Patient taking differently: Inject 7-8 Units into the skin See admin instructions. Take 8 units in the morning and 7 units at dinner)   isosorbide mononitrate (IMDUR) 30 MG 24 hr tablet Take 1 tablet (30 mg total) by mouth daily.   PRESCRIPTION MEDICATION Inject 0.1-1 mLs as directed See admin instructions. Tri-Mix Standard Strength 5 ml. Formula: Prostaglandin 10 mcg/ml, Papaverine 30 mg/ml, Phentolamine 1 mg/ml. Inject 0.1 ml into side of penis as directed as needed (to be injected immediately before sexual intercourse) may increase the dose by 0.1 ml every 48 hours to achieve an erection. Max dose 1 ml.   sacubitril-valsartan (ENTRESTO) 24-26 MG Take 1 tablet by mouth 2 (two) times daily.   vitamin B-12 (CYANOCOBALAMIN) 500 MCG tablet Take 500 mcg by mouth in the morning.   Current Facility-Administered Medications for the 02/16/22 encounter (Office Visit) with [facility]  Medication   sodium chloride flush (NS) 0.9 % injection 3 mL   sodium chloride flush (NS) 0.9 % injection 3 mL     Allergies:   Codeine and Percocet [oxycodone-acetaminophen]   Social History   Socioeconomic History   Marital status: Widowed    Spouse name: Not on file   Number of children: 3   Years of education: Not on file   Highest education level: High school graduate  Occupational History   Occupation: Caregiver    Comment: Special needs    Comment: retired  Tobacco Use   Smoking status: Never   Smokeless tobacco: Never  Vaping Use   Vaping Use: Never used  Substance and Sexual Activity   Alcohol use: Yes    Comment: occasional   Drug use: No   Sexual activity: Not Currently  Other Topics Concern   Not on file  Social History Narrative   Widower.  3 daughters and 3 grandchildren.     Social Determinants of Health   Financial Resource Strain: Low Risk  (10/29/2021)   Overall Financial Resource Strain (CARDIA)    Difficulty of Paying Living  Expenses: Not hard at all  Food Insecurity: No Food Insecurity (01/14/2022)   Hunger Vital Sign    Worried About Running Out of Food in the Last Year: Never true    Ran Out of Food in the Last Year: Never true  Transportation Needs: No Transportation Needs (01/14/2022)   PRAPARE - Hydrologist (Medical): No    Lack of Transportation (Non-Medical): No  Physical Activity: Sufficiently Active (12/06/2020)   Exercise Vital Sign    Days of Exercise per Week: 4 days    Minutes of Exercise per Session: 60  min  Stress: No Stress Concern Present (12/06/2020)   [place]    Feeling of Stress : Not at all  Social Connections: Moderately Isolated (12/06/2020)   Social Connection and Isolation Panel [NHANES]    Frequency of Communication with Friends and Family: More than three times a week    Frequency of Social Gatherings with Friends and Family: More than three times a week    Attends Religious Services: More than 4 times per year    Active Member of [employer] or Organizations: No    Attends Archivist Meetings: Never    Marital Status: Widowed    Family History: The patient's family history includes Alcohol abuse in his father; CAD in his father; CAD (age of onset: 58) in his brother; Colon polyps in his mother; Diabetes in his mother; Hypertension in his father. There is no history of Colon cancer, Esophageal cancer, Rectal cancer, or Stomach cancer.  ROS:   Please see the history of present illness.    All other systems reviewed and are negative.  EKGs/Labs/Other Studies Reviewed:    The following studies were reviewed today:  TAVR OPERATIVE NOTE     Date of Procedure:                01/11/2022   Preoperative Diagnosis:      Severe Aortic Stenosis    Postoperative Diagnosis:    Same    Procedure:        Transcatheter Aortic Valve Replacement - Percutaneous Left Transfemoral  Approach             Edwards Sapien 3 Ultra Resilia THV (size 26 mm, model # 9755RSL, serial # [id number])              Co-Surgeons:            Gaye Pollack, MD and Sherren Mocha, MD      Anesthesiologist:                  Wilfrid Lund, MD   Echocardiographer:              Viona Gilmore. O'Neal, MD   Pre-operative Echo Findings: Severe aortic stenosis Severe left ventricular systolic dysfunction   Post-operative Echo Findings: No paravalvular leak Unchanged severe left ventricular systolic dysfunction _____________     Echo 01/12/22:   1. 26 mm S3. Vmax 1.5 m/s, MG 5.7 mmHG, EOA 2.34 cm2, DI 0.48. No  regurgitation or paravalvular leak. The aortic valve has been  repaired/replaced. Aortic valve regurgitation is not visualized. There is  a 26 mm Sapien prosthetic (TAVR) valve present in  the aortic position. Procedure Date: 01/11/22. Echo findings are  consistent with normal structure and function of the aortic valve  prosthesis.   2. Left ventricular ejection fraction, by estimation, is 20 to 25%. The  left ventricle has severely decreased function. The left ventricle  demonstrates global hypokinesis. Left ventricular diastolic parameters are  consistent with Grade II diastolic  dysfunction (pseudonormalization).   3. Right ventricular systolic function is moderately reduced. The right  ventricular size is normal. There is normal pulmonary artery systolic  pressure. The estimated right ventricular systolic pressure is 50.0 mmHg.   4. Left atrial size was mildly dilated.   5. The mitral valve is grossly normal. Trivial mitral valve  regurgitation. No evidence of mitral stenosis.   6. The inferior vena cava is dilated in size with >50% respiratory  variability,  suggesting right atrial pressure of 8 mmHg.   ________________________   ECho 02/16/22 IMPRESSIONS   1. EF improved since TAVR procedure . Left ventricular ejection fraction,  by estimation, is 35 to 40%. The left ventricle  has moderately decreased  function. The left ventricle demonstrates global hypokinesis. There is  moderate left ventricular  hypertrophy. Left ventricular diastolic parameters are consistent with  Grade I diastolic dysfunction (impaired relaxation). Elevated left  ventricular end-diastolic pressure.   2. Right ventricular systolic function is normal. The right ventricular  size is normal.   3. Left atrial size was mildly dilated.   4. The mitral valve is normal in structure. No evidence of mitral valve  regurgitation. No evidence of mitral stenosis.   5. 26 mm Sapien 3 TAVR valve Trivial central regurgitation. Mild PVL new  since 01/13/22 with mild increase in gradients See image 57 I think these  leaks were present on day one post implant but not imaged well compared to  current study . The aortic  valve has been repaired/replaced. Aortic valve regurgitation is not  visualized. No aortic stenosis is present. There is a 26 mm Edwards Sapien  prosthetic (TAVR) valve present in the aortic position. Procedure Date:  01/12/22.   6. The inferior vena cava is normal in size with greater than 50%  respiratory variability, suggesting right atrial pressure of 3 mmHg.   EKG:  EKG is NOT ordered today.    Recent Labs: 10/27/2021: B Natriuretic Peptide 1,165.9 10/28/2021: TSH 2.565 01/07/2022: ALT 33 01/12/2022: Magnesium 1.8 01/13/2022: Hemoglobin 14.2; Platelets 207 02/16/2022: BUN 22; Creatinine, Ser 1.39; Potassium 4.4; Sodium 142   Recent Lipid Panel    Component Value Date/Time   CHOL 106 06/05/2020 0947   TRIG 138 06/05/2020 0947   HDL 53 06/05/2020 0947   CHOLHDL 2.0 06/05/2020 0947   CHOLHDL 3.9 04/02/2013 1632   VLDL 66 (H) 04/02/2013 1632   LDLCALC 29 06/05/2020 0947   Physical Exam:    VS:  BP (!) 138/100 Comment: 140/100 in left arm  Pulse 66   Ht _0  (1.702 m)   Wt 177 lb 12.8 oz (80.6 kg)   SpO2 97%   BMI 27.85 kg/m     Wt Readings from Last 3 Encounters:   02/16/22 177 lb 12.8 oz (80.6 kg)  01/20/22 180 lb (81.6 kg)  01/17/22 180 lb (81.6 kg)    General: Well developed, well nourished, NAD Lungs:Clear to ausculation bilaterally. No wheezes, rales, or rhonchi. Breathing is unlabored. Cardiovascular: RRR with S1 S2. No murmurs Extremities: No edema.  Neuro: Alert and oriented. No focal deficits. No facial asymmetry. MAE spontaneously. Psych: Responds to questions appropriately with normal affect.    ASSESSMENT/PLAN:    Severe LFLG AS s/p TAVR: echo today shows EF 35-40%, mod LVH, normally functioning TAVR with a mean gradient of 8.5 mm hg and mild central PVL. He has NYHA class I symptoms with a marked clinical improvement since TAVR. Continue on aspirin 83m daily. He has amoxicillin for SBE prophylaxis. I will see him back in 1 year for echo and follow up.   Chronic combined S/D CHF/NICM: EF improved to 35-40% s/p TAVR. Continue on Coreg, empagliflozin (in [place], hydralazine/nitrates and Lasix. Entresto and spiro both discontinued due to hyperkalemia. BP is elevated today. Will re challenge Entresto 24-250mBID and follow labs.   Amyloid w/u completed post TAVR. PYP was equivocal and multiple myeloma panel was abnormal. He has been referred to heme-onc. He has follow  up with Dr. Daniel Nones on 02/22/22.   HTN: BP remains elevated. Restart Entresto 24-9m BID and follow potassium closely.    CKD stage IIIb: Baseline appears to be 1.4-1.6 range. Check BMET today    Hyperkalemia: restarting Entresto today. Follow labs .   PAD: Followed by Dr. BGwenlyn Foundand [facility]VS. Continue aspirin and statin.     Medication Adjustments/Labs and Tests Ordered: Current medicines are reviewed at length with the patient today.  Concerns regarding medicines are outlined above.  Orders Placed This Encounter  Procedures   Basic Metabolic Panel (BMET)   ECHOCARDIOGRAM COMPLETE   No orders of the defined types were placed in this encounter.   Patient  Instructions  Medication Instructions:  Your physician recommends that you continue on your current medications as directed. Please refer to the Current Medication list given to you today.  *If you need a refill on your cardiac medications before your next appointment, please call your pharmacy*  Lab Work: Your physician recommends that you have lab work today. BMET If you have labs (blood work) drawn today and your tests are completely normal, you will receive your results only by: [place](if you have MyChart) OR A paper copy in the mail If you have any lab test that is abnormal or we need to change your treatment, we will call you to review the results.  Testing/Procedures: Your physician has requested that you have an echocardiogram on January 11, 2023. Echocardiography is a painless test that uses sound waves to create images of your heart. It provides your doctor with information about the size and shape of your heart and how well your heart's chambers and valves are working. This procedure takes approximately one hour. There are no restrictions for this procedure. Please do NOT wear cologne, perfume, aftershave, or lotions (deodorant is allowed). Please arrive 15 minutes prior to your appointment time.  Follow-Up: [facility] you and your health needs are our priority.  As part of our continuing mission to provide you with exceptional heart care, we have created designated Provider Care Teams.  These Care Teams include your primary Cardiologist (physician) and Advanced Practice Providers (APPs -  Physician Assistants and Nurse Practitioners) who all work together to provide you with the care you need, when you need it.  We recommend signing up for the patient portal called "MyChart".  Sign up information is provided on this After Visit Summary.  MyChart is used to connect with patients for Virtual Visits (Telemedicine).  Patients are able to view lab/test results,  encounter notes, upcoming appointments, etc.  Non-urgent messages can be sent to your provider as well.   To learn more about what you can do with MyChart, go to [email or web address]    Your next appointment:   January 11, 2023  The format for your next appointment:   In Person  Provider:   KNell Range PA-C    Important Information About Sugar         Signed, KAngelena Form PA-C  02/17/2022 1:40 PM    [place]

## 2022-02-16 NOTE — Patient Instructions (Signed)
Medication Instructions:  Your physician recommends that you continue on your current medications as directed. Please refer to the Current Medication list given to you today.  *If you need a refill on your cardiac medications before your next appointment, please call your pharmacy*  Lab Work: Your physician recommends that you have lab work today. BMET If you have labs (blood work) drawn today and your tests are completely normal, you will receive your results only by: [place] (if you have MyChart) OR A paper copy in the mail If you have any lab test that is abnormal or we need to change your treatment, we will call you to review the results.  Testing/Procedures: Your physician has requested that you have an echocardiogram on January 11, 2023. Echocardiography is a painless test that uses sound waves to create images of your heart. It provides your doctor with information about the size and shape of your heart and how well your heart's chambers and valves are working. This procedure takes approximately one hour. There are no restrictions for this procedure. Please do NOT wear cologne, perfume, aftershave, or lotions (deodorant is allowed). Please arrive 15 minutes prior to your appointment time.  Follow-Up: At [facility], you and your health needs are our priority.  As part of our continuing mission to provide you with exceptional heart care, we have created designated Provider Care Teams.  These Care Teams include your primary Cardiologist (physician) and Advanced Practice Providers (APPs -  Physician Assistants and Nurse Practitioners) who all work together to provide you with the care you need, when you need it.  We recommend signing up for the patient portal called "MyChart".  Sign up information is provided on this After Visit Summary.  MyChart is used to connect with patients for Virtual Visits (Telemedicine).  Patients are able to view lab/test results, encounter notes,  upcoming appointments, etc.  Non-urgent messages can be sent to your provider as well.   To learn more about what you can do with MyChart, go to [email or web address].    Your next appointment:   January 11, 2023  The format for your next appointment:   In Person  Provider:   Nell Range, PA-C    Important Information About Sugar

## 2022-02-17 LAB — BASIC METABOLIC PANEL
BUN/Creatinine Ratio: 16 (ref 10–24)
BUN: 22 mg/dL (ref 8–27)
CO2: 26 mmol/L (ref 20–29)
Calcium: 9.4 mg/dL (ref 8.6–10.2)
Chloride: 96 mmol/L (ref 96–106)
Creatinine, Ser: 1.39 mg/dL — ABNORMAL HIGH (ref 0.76–1.27)
Glucose: 110 mg/dL — ABNORMAL HIGH (ref 70–99)
Potassium: 4.4 mmol/L (ref 3.5–5.2)
Sodium: 142 mmol/L (ref 134–144)
eGFR: 54 mL/min/{1.73_m2} — ABNORMAL LOW (ref >59)

## 2022-02-22 ENCOUNTER — Encounter (HOSPITAL_COMMUNITY): Payer: Medicare HMO | Admitting: Cardiology

## 2022-02-28 ENCOUNTER — Telehealth: Payer: Self-pay | Admitting: *Deleted

## 2022-02-28 NOTE — Progress Notes (Signed)
  Care Coordination Note  02/28/2022 Name: Caleb [name] MRN: [id number] DOB: [birth year]  Caleb [name] is a 71 y.o. year old male who is a primary care patient of [name], MD and is actively engaged with the care management team. I reached out to [name] by phone today to assist with re-scheduling an initial visit with the RN Case Manager  Follow up plan: Unsuccessful telephone outreach attempt made. A HIPAA compliant phone message was left for the patient providing contact information and requesting a return call.   [place]  Direct Dial: [phone]

## 2022-03-01 NOTE — Progress Notes (Signed)
  Care Coordination Note  03/01/2022 Name: TANUJ MULLENS MRN: [id number] DOB: December 09, 1950  MIACHEL NARDELLI is a 71 y.o. year old male who is a primary care patient of [name], MD and is actively engaged with the care management team. I reached out to [name] by phone today to assist with re-scheduling an initial visit with the RN Case Manager  Follow up plan: Unsuccessful telephone outreach attempt made. A HIPAA compliant phone message was left for the patient providing contact information and requesting a return call.   [place]  Direct Dial: [phone]

## 2022-03-07 ENCOUNTER — Telehealth: Payer: Self-pay

## 2022-03-07 ENCOUNTER — Ambulatory Visit (HOSPITAL_COMMUNITY)
Admission: RE | Admit: 2022-03-07 | Discharge: 2022-03-07 | Disposition: A | Discharge status: Home / Self Care | Payer: Medicare HMO | Source: Ambulatory Visit | Attending: Cardiology | Admitting: Cardiology

## 2022-03-07 ENCOUNTER — Inpatient Hospital Stay: Payer: Medicare HMO

## 2022-03-07 ENCOUNTER — Encounter (HOSPITAL_COMMUNITY): Payer: Self-pay | Admitting: Cardiology

## 2022-03-07 ENCOUNTER — Inpatient Hospital Stay: Payer: Medicare HMO | Attending: Hematology and Oncology | Admitting: Hematology and Oncology

## 2022-03-07 ENCOUNTER — Encounter: Payer: Self-pay | Admitting: Hematology and Oncology

## 2022-03-07 VITALS — BP 134/77 | HR 82 | Temp 98.0°F | Resp 18 | Ht 67.0 in | Wt 181.4 lb

## 2022-03-07 VITALS — BP 120/80 | HR 71 | Wt 182.8 lb

## 2022-03-07 DIAGNOSIS — Z952 Presence of prosthetic heart valve: Secondary | ICD-10-CM | POA: Insufficient documentation

## 2022-03-07 DIAGNOSIS — E8582 Wild-type transthyretin-related (ATTR) amyloidosis: Secondary | ICD-10-CM | POA: Insufficient documentation

## 2022-03-07 DIAGNOSIS — N183 Chronic kidney disease, stage 3 unspecified: Secondary | ICD-10-CM | POA: Insufficient documentation

## 2022-03-07 DIAGNOSIS — Z794 Long term (current) use of insulin: Secondary | ICD-10-CM | POA: Diagnosis not present

## 2022-03-07 DIAGNOSIS — Z79899 Other long term (current) drug therapy: Secondary | ICD-10-CM | POA: Insufficient documentation

## 2022-03-07 DIAGNOSIS — I2510 Atherosclerotic heart disease of native coronary artery without angina pectoris: Secondary | ICD-10-CM | POA: Insufficient documentation

## 2022-03-07 DIAGNOSIS — E1122 Type 2 diabetes mellitus with diabetic chronic kidney disease: Secondary | ICD-10-CM | POA: Diagnosis not present

## 2022-03-07 DIAGNOSIS — I130 Hypertensive heart and chronic kidney disease with heart failure and stage 1 through stage 4 chronic kidney disease, or unspecified chronic kidney disease: Secondary | ICD-10-CM | POA: Diagnosis not present

## 2022-03-07 DIAGNOSIS — Z7952 Long term (current) use of systemic steroids: Secondary | ICD-10-CM | POA: Diagnosis not present

## 2022-03-07 DIAGNOSIS — K861 Other chronic pancreatitis: Secondary | ICD-10-CM | POA: Diagnosis not present

## 2022-03-07 DIAGNOSIS — I428 Other cardiomyopathies: Secondary | ICD-10-CM | POA: Diagnosis not present

## 2022-03-07 DIAGNOSIS — E782 Mixed hyperlipidemia: Secondary | ICD-10-CM | POA: Diagnosis not present

## 2022-03-07 DIAGNOSIS — I350 Nonrheumatic aortic (valve) stenosis: Secondary | ICD-10-CM | POA: Diagnosis not present

## 2022-03-07 DIAGNOSIS — N1831 Chronic kidney disease, stage 3a: Secondary | ICD-10-CM | POA: Insufficient documentation

## 2022-03-07 DIAGNOSIS — I5022 Chronic systolic (congestive) heart failure: Secondary | ICD-10-CM | POA: Insufficient documentation

## 2022-03-07 DIAGNOSIS — I739 Peripheral vascular disease, unspecified: Secondary | ICD-10-CM | POA: Diagnosis not present

## 2022-03-07 DIAGNOSIS — E1151 Type 2 diabetes mellitus with diabetic peripheral angiopathy without gangrene: Secondary | ICD-10-CM | POA: Diagnosis not present

## 2022-03-07 DIAGNOSIS — E875 Hyperkalemia: Secondary | ICD-10-CM | POA: Diagnosis not present

## 2022-03-07 DIAGNOSIS — Z7984 Long term (current) use of oral hypoglycemic drugs: Secondary | ICD-10-CM | POA: Insufficient documentation

## 2022-03-07 DIAGNOSIS — D472 Monoclonal gammopathy: Secondary | ICD-10-CM | POA: Insufficient documentation

## 2022-03-07 DIAGNOSIS — F1090 Alcohol use, unspecified, uncomplicated: Secondary | ICD-10-CM | POA: Insufficient documentation

## 2022-03-07 LAB — BASIC METABOLIC PANEL
Anion gap: 9 (ref 5–15)
BUN: 19 mg/dL (ref 8–23)
CO2: 25 mmol/L (ref 22–32)
Calcium: 8.7 mg/dL — ABNORMAL LOW (ref 8.9–10.3)
Chloride: 101 mmol/L (ref 98–111)
Creatinine, Ser: 1.2 mg/dL (ref 0.61–1.24)
GFR, Estimated: >60 mL/min (ref >60)
Glucose, Bld: 151 mg/dL — ABNORMAL HIGH (ref 70–99)
Potassium: 3.7 mmol/L (ref 3.5–5.1)
Sodium: 135 mmol/L (ref 135–145)

## 2022-03-07 LAB — LIPID PANEL
Cholesterol: 129 mg/dL (ref 0–200)
HDL: 47 mg/dL (ref >40)
LDL Cholesterol: 65 mg/dL (ref 0–99)
Total CHOL/HDL Ratio: 2.7 RATIO
Triglycerides: 85 mg/dL (ref <150)
VLDL: 17 mg/dL (ref 0–40)

## 2022-03-07 LAB — BRAIN NATRIURETIC PEPTIDE: B Natriuretic Peptide: 728.7 pg/mL — ABNORMAL HIGH (ref 0.0–100.0)

## 2022-03-07 MED ORDER — ENTRESTO 49-51 MG PO TABS: 1.0000 | ORAL_TABLET | Freq: Two times a day (BID) | ORAL | 11 refills | Status: DC

## 2022-03-07 NOTE — Assessment & Plan Note (Signed)
This is multifactorial, likely related to his cardiac disease and diabetes Continue medical management He is baseline elevated creatinine could impact on interpretation of his light chain studies

## 2022-03-07 NOTE — Patient Instructions (Signed)
INCREASE Entresto to 49/'51mg'$  Twice daily  Labs done today, your results will be available in MyChart, we will contact you for abnormal readings.   Please follow up with our heart failure pharmacist in 6 weeks    Your physician recommends that you schedule a follow-up appointment in: 3 months  If you have any questions or concerns before your next appointment please send [place] a message through [place] or call our office at [phone].    TO LEAVE A MESSAGE FOR THE NURSE SELECT OPTION 2, PLEASE LEAVE A MESSAGE INCLUDING: YOUR NAME DATE OF BIRTH CALL BACK NUMBER REASON FOR CALL**this is important as we prioritize the call backs  YOU WILL RECEIVE A CALL BACK THE SAME DAY AS LONG AS YOU CALL BEFORE 4:00 PM  At the [facility], you and your health needs are our priority. As part of our continuing mission to provide you with exceptional heart care, we have created designated Provider Care Teams. These Care Teams include your primary Cardiologist (physician) and Advanced Practice Providers (APPs- Physician Assistants and Nurse Practitioners) who all work together to provide you with the care you need, when you need it.   You may see any of the following providers on your designated Care Team at your next follow up: Dr Glori Bickers Dr Loralie Champagne Dr. Roxana Hires, NP Lyda Jester, [facility] [place], [place] Forestine Na, NP Audry Riles, PharmD   Please be sure to bring in all your medications bottles to every appointment.

## 2022-03-07 NOTE — Progress Notes (Unsigned)
[name] CONSULT NOTE  Patient Care Team: [name], MD as PCP - General (Family Medicine) Lorretta Harp, MD as PCP - Cardiology (Cardiology) Rex Kras, Claudette Stapler, RN as [place] Management  ASSESSMENT & PLAN:  MGUS (monoclonal gammopathy of unknown significance) I have reviewed his recent myeloma panel The test revealed IgG kappa MGUS The imaging study of his heart show possibility of cardiac amyloidosis secondary to TTR I informed the patient that I do not treat amyloidosis secondary to TTR but if he has primary amyloidosis, that would warrant treatment It is not clear whether his peripheral neuropathy is related to amyloidosis versus diabetes I recommend we proceed with blood work and imaging study first as well as 24-hour urine collection I will see him after the new year If the tests are still not clear, we will proceed with bone marrow aspirate and biopsy We discussed the stepwise approach and he is in agreement In the meantime, he will continue medical management for his cardiac disease  Stage III chronic kidney disease ([place]) This is multifactorial, likely related to his cardiac disease and diabetes Continue medical management He is baseline elevated creatinine could impact on interpretation of his light chain studies  To rule out multiple myeloma, I recommend complete blood work, 24 hour urine collection for UPEP and skeletal survey to rule out multiple myeloma Depending on test results, we may or may not proceed with bone marrow aspirate and biopsy. Orders Placed This Encounter  Procedures   DG Bone Survey Met    Standing Status:   Future    Standing Expiration Date:   03/08/2023    Order Specific Question:   Reason for Exam (SYMPTOM  OR DIAGNOSIS REQUIRED)    Answer:   staging myeloma    Order Specific Question:   Preferred imaging location?    Answer:   [facility]   GeneSeq PLUS, [place]   Kappa/lambda light chains     Standing Status:   Future    Standing Expiration Date:   03/08/2023   Multiple Myeloma Panel (SPEP&IFE w/QIG)    Standing Status:   Future    Standing Expiration Date:   03/08/2023   UPEP/UIFE/Light Chains/TP, 24-Hr Ur    Standing Status:   Future    Standing Expiration Date:   03/08/2023   VITAMIN D 25 Hydroxy (Vit-D Deficiency, Fractures)    Standing Status:   Future    Standing Expiration Date:   03/08/2023   CMP ([place] only)    Standing Status:   Future    Standing Expiration Date:   03/08/2023   CBC with Differential ([place] Only)    Standing Status:   Future    Standing Expiration Date:   03/08/2023    All questions were answered. The patient knows to call the clinic with any problems, questions or concerns. I spent 60 minutes counseling the patient face to face and on counseling.     Heath Lark, MD 03/07/22 3:22 PM  CHIEF COMPLAINTS/PURPOSE OF CONSULTATION:  MGUS, possible amyloidosis  HISTORY OF PRESENTING ILLNESS:  Caleb [name] 71 y.o. male is here because of concern for possible amyloidosis, abnormal MGUS This patient has significant coronary artery disease, valvular heart disease, peripheral vascular disease, insulin-dependent diabetes and chronic kidney disease He had extensive evaluation and had imaging study of his heart with MRI that disclosed the possibility of amyloidosis secondary to TTR, per report He has severe peripheral neuropathy affecting his hands and  feet He has been diagnosed with insulin-dependent diabetes for the past 15 years.  Prior to that, the patient has significant alcohol intake and with recurrent pancreatitis causing pancreatic damage He denies history of abnormal bone pain or bone fracture. Patient denies history of recurrent infection or atypical infections such as shingles of meningitis. Denies chills, night sweats, anorexia or abnormal weight loss. Despite his severe neuropathy, he is to exercise on a regular  basis  MEDICAL HISTORY:  Past Medical History:  Diagnosis Date   Carotid artery occlusion    Diabetes mellitus without complication ([place])    Type II   Dyslipidemia 09/27/2012   Erectile dysfunction 09/27/2012   GERD (gastroesophageal reflux disease)    Hypercholesteremia    Hyperlipidemia 08/12/2012   Hypertension    Hyponatremia 08/14/2012   Neuropathy    Pancreatitis    Pancreatitis, acute 09/27/2012   Peripheral vascular disease ([place])    S/P 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 Burt Knack & Bartle   Severe aortic stenosis    Vitamin D deficiency 06/23/2015    SURGICAL HISTORY: Past Surgical History:  Procedure Laterality Date   ABDOMINAL AORTOGRAM W/LOWER EXTREMITY N/A 08/08/2019   Procedure: ABDOMINAL AORTOGRAM W/LOWER EXTREMITY;  Surgeon: Lorretta Harp, MD;  Location: [place] [facility];  Service: Cardiovascular;  Laterality: N/A;   ABDOMINAL AORTOGRAM W/LOWER EXTREMITY N/A 11/18/2019   Procedure: ABDOMINAL AORTOGRAM W/LOWER EXTREMITY;  Surgeon: Lorretta Harp, MD;  Location: [place] [facility];  Service: Cardiovascular;  Laterality: N/A;   CARDIAC CATHETERIZATION     COLONOSCOPY  12 years ago   in [place] clinic= normal exam per pt   INTRAOPERATIVE TRANSTHORACIC ECHOCARDIOGRAM N/A 01/11/2022   Procedure: INTRAOPERATIVE TRANSTHORACIC ECHOCARDIOGRAM;  Surgeon: Sherren Mocha, MD;  Location: [place] [facility];  Service: Open Heart Surgery;  Laterality: N/A;   KNEE SURGERY     MULTIPLE EXTRACTIONS WITH ALVEOLOPLASTY N/A 12/09/2021   Procedure: MULTIPLE EXTRACTION;  Surgeon: Charlaine Dalton, DMD;  Location: [place];  Service: Dentistry;  Laterality: N/A;   PERIPHERAL VASCULAR INTERVENTION Right 08/08/2019   Procedure: PERIPHERAL VASCULAR INTERVENTION;  Surgeon: Lorretta Harp, MD;  Location: [place] [facility];  Service: Cardiovascular;  Laterality: Right;   PERIPHERAL VASCULAR  INTERVENTION Left 11/18/2019   Procedure: PERIPHERAL VASCULAR INTERVENTION;  Surgeon: Lorretta Harp, MD;  Location: [place] [facility];  Service: Cardiovascular;  Laterality: Left;  left popliteal artery   RIGHT/LEFT HEART CATH AND CORONARY ANGIOGRAPHY N/A 10/29/2021   Procedure: RIGHT/LEFT HEART CATH AND CORONARY ANGIOGRAPHY;  Surgeon: Burnell Blanks, MD;  Location: [place] [facility];  Service: Cardiovascular;  Laterality: N/A;   TRANSCAROTID ARTERY REVASCULARIZATION  Left 12/16/2019   Procedure: TRANSCAROTID ARTERY REVASCULARIZATION;  Surgeon: Elam Dutch, MD;  Location: [place];  Service: Vascular;  Laterality: Left;   TRANSCAROTID ARTERY REVASCULARIZATION  Right 02/03/2020   Procedure: RIGHT TRANSCAROTID ARTERY REVASCULARIZATION;  Surgeon: Elam Dutch, MD;  Location: [place];  Service: Vascular;  Laterality: Right;   TRANSCATHETER AORTIC VALVE REPLACEMENT, TRANSFEMORAL N/A 01/11/2022   Procedure: Transcatheter Aortic Valve Replacement, Transfemoral;  Surgeon: Sherren Mocha, MD;  Location: [place] [facility];  Service: Open Heart Surgery;  Laterality: N/A;   ULTRASOUND GUIDANCE FOR VASCULAR ACCESS Right 12/16/2019   Procedure: ULTRASOUND GUIDANCE FOR VASCULAR ACCESS;  Surgeon: Elam Dutch, MD;  Location: [place];  Service: Vascular;  Laterality: Right;   ULTRASOUND  GUIDANCE FOR VASCULAR ACCESS Right 02/03/2020   Procedure: ULTRASOUND GUIDANCE FOR VASCULAR ACCESS;  Surgeon: Elam Dutch, MD;  Location: [place];  Service: Vascular;  Laterality: Right;   VASECTOMY      SOCIAL HISTORY: Social History   Socioeconomic History   Marital status: Widowed    Spouse name: Not on file   Number of children: 3   Years of education: Not on file   Highest education level: High school graduate  Occupational History   Occupation: Caregiver    Comment: Special needs    Comment: retired  Tobacco Use   Smoking status: Never   Smokeless tobacco: Never  Vaping Use    Vaping Use: Never used  Substance and Sexual Activity   Alcohol use: Yes    Comment: occasional   Drug use: No   Sexual activity: Not Currently  Other Topics Concern   Not on file  Social History Narrative   Widower.  3 daughters and 3 grandchildren.     Social Determinants of Health   Financial Resource Strain: Low Risk  (10/29/2021)   Overall Financial Resource Strain (CARDIA)    Difficulty of Paying Living Expenses: Not hard at all  Food Insecurity: No Food Insecurity (01/14/2022)   Hunger Vital Sign    Worried About Running Out of Food in the Last Year: Never true    Ran Out of Food in the Last Year: Never true  Transportation Needs: No Transportation Needs (01/14/2022)   PRAPARE - Hydrologist (Medical): No    Lack of Transportation (Non-Medical): No  Physical Activity: Sufficiently Active (12/06/2020)   Exercise Vital Sign    Days of Exercise per Week: 4 days    Minutes of Exercise per Session: 60 min  Stress: No Stress Concern Present (12/06/2020)   [place]    Feeling of Stress : Not at all  Social Connections: Moderately Isolated (12/06/2020)   Social Connection and Isolation Panel [NHANES]    Frequency of Communication with Friends and Family: More than three times a week    Frequency of Social Gatherings with Friends and Family: More than three times a week    Attends Religious Services: More than 4 times per year    Active Member of [employer] or Organizations: No    Attends Archivist Meetings: Never    Marital Status: Widowed  Intimate Partner Violence: Not At Risk (01/12/2022)   Humiliation, Afraid, Rape, and Kick questionnaire    Fear of Current or Ex-Partner: No    Emotionally Abused: No    Physically Abused: No    Sexually Abused: No    FAMILY HISTORY: Family History  Problem Relation Age of Onset   CAD Father    Hypertension Father    Alcohol abuse  Father        Cause of death   Diabetes Mother    Colon polyps Mother    CAD Brother 60       CABG   Colon cancer Neg Hx    Esophageal cancer Neg Hx    Rectal cancer Neg Hx    Stomach cancer Neg Hx     ALLERGIES:  is allergic to codeine and percocet [oxycodone-acetaminophen].  MEDICATIONS:  Current Outpatient Medications  Medication Sig Dispense Refill   amoxicillin (AMOXIL) 500 MG capsule Take 4 capsules (2,000 mg total) by mouth as directed. Take 4 tablets 1 hour prior to dental work, including  cleanings. 12 capsule 12   aspirin EC 81 MG tablet Take 81 mg by mouth in the morning.     atorvastatin (LIPITOR) 40 MG tablet Take 40 mg by mouth in the morning.     carvedilol (COREG) 12.5 MG tablet Take 1 tablet (12.5 mg total) by mouth 2 (two) times daily with a meal. 60 tablet 0   Empagliflozin-metFORMIN HCl ER (SYNJARDY XR) 25-1000 MG TB24 Take 1 tablet by mouth in the morning.     ferrous sulfate 325 (65 FE) MG tablet Take 325 mg by mouth in the morning.     furosemide (LASIX) 40 MG tablet Take 1 tablet (40 mg total) by mouth daily. 30 tablet 0   gabapentin (NEURONTIN) 800 MG tablet Take 800 mg by mouth at bedtime as needed (Sleep).     hydrALAZINE (APRESOLINE) 25 MG tablet Take 1 tablet (25 mg total) by mouth 3 (three) times daily. 90 tablet 3   insulin NPH-regular Human (70-30) 100 UNIT/ML injection Inject 8 Units into the skin daily with breakfast. 7 units with dinner     isosorbide mononitrate (IMDUR) 30 MG 24 hr tablet Take 1 tablet (30 mg total) by mouth daily. 30 tablet 3   PRESCRIPTION MEDICATION Inject 0.1-1 mLs as directed See admin instructions. Tri-Mix Standard Strength 5 ml. Formula: Prostaglandin 10 mcg/ml, Papaverine 30 mg/ml, Phentolamine 1 mg/ml. Inject 0.1 ml into side of penis as directed as needed (to be injected immediately before sexual intercourse) may increase the dose by 0.1 ml every 48 hours to achieve an erection. Max dose 1 ml.     sacubitril-valsartan  (ENTRESTO) 49-51 MG Take 1 tablet by mouth 2 (two) times daily. 60 tablet 11   vitamin B-12 (CYANOCOBALAMIN) 500 MCG tablet Take 500 mcg by mouth in the morning.     Current Facility-Administered Medications  Medication Dose Route Frequency Provider Last Rate Last Admin   sodium chloride flush (NS) 0.9 % injection 3 mL  3 mL Intravenous Q12H Lorretta Harp, MD       sodium chloride flush (NS) 0.9 % injection 3 mL  3 mL Intravenous Q12H Lorretta Harp, MD        REVIEW OF SYSTEMS:   Eyes: Denies blurriness of vision, double vision or watery eyes Ears, nose, mouth, throat, and face: Denies mucositis or sore throat Respiratory: Denies cough, dyspnea or wheezes Cardiovascular: Denies palpitation, chest discomfort or lower extremity swelling Gastrointestinal:  Denies nausea, heartburn or change in bowel habits Skin: Denies abnormal skin rashes Lymphatics: Denies new lymphadenopathy or easy bruising Behavioral/Psych: Mood is stable, no new changes  All other systems were reviewed with the patient and are negative.  PHYSICAL EXAMINATION: ECOG PERFORMANCE STATUS: 1 - Symptomatic but completely ambulatory  Vitals:   03/07/22 1421  BP: 134/77  Pulse: 82  Resp: 18  Temp: 98 F (36.7 C)  SpO2: 100%   Filed Weights   03/07/22 1421  Weight: 181 lb 6.4 oz (82.3 kg)    GENERAL:alert, no distress and comfortable SKIN: skin color, texture, turgor are normal, no rashes or significant lesions EYES: normal, conjunctiva are pink and non-injected, sclera clear OROPHARYNX:no exudate, no erythema and lips, buccal mucosa, and tongue normal  NECK: supple, thyroid normal size, non-tender, without nodularity LYMPH:  no palpable lymphadenopathy in the cervical, axillary or inguinal LUNGS: clear to auscultation and percussion with normal breathing effort HEART: regular rate & rhythm and no murmurs and no lower extremity edema ABDOMEN:abdomen soft, non-tender and normal bowel  sounds Musculoskeletal:no cyanosis of digits and no clubbing  PSYCH: alert & oriented x 3 with fluent speech NEURO: no focal motor/sensory deficits  LABORATORY DATA:  I have reviewed the data as listed Lab Results  Component Value Date   WBC 6.9 01/13/2022   HGB 14.2 01/13/2022   HCT 43.4 01/13/2022   MCV 83.6 01/13/2022   PLT 207 01/13/2022    RADIOGRAPHIC STUDIES: I have personally reviewed the radiological images as listed and agreed with the findings in the report. ECHOCARDIOGRAM COMPLETE  Result Date: 02/16/2022    ECHOCARDIOGRAM REPORT   Patient Name:   Caleb [name] Date of Exam: 02/16/2022 Medical Rec #:  [id number]       Height:       67.0 in Accession #:    [id number]      Weight:       180.0 lb Date of Birth:  07-29-1950        BSA:          1.934 m Patient Age:    71 years        BP:           130/78 mmHg Patient Gender: M               HR:           67 bpm. Exam Location:  [place] Procedure: 2D Echo, Cardiac Doppler, Color Doppler and Strain Analysis Indications:    Z95.2 TAAVR  History:        Patient has prior history of Echocardiogram examinations, most                 recent 01/12/2022. Non-ischemic CM, chronic combines systolic                 and diastolic CHF, PAD; Risk Factors:Hypertension, Diabetes and                 Dyslipidemia. Previous echo revealed LVEF 25% moderate reduced                 RV function PAP 15 mmHg TAVR peak grad 9.7 mm Hg and mean 5.7 mm                 Hg.                 Aortic Valve: 26 mm Edwards Sapien prosthetic, stented (TAVR)                 valve is present in the aortic position. Procedure Date:                 01/12/22.  Sonographer:    Lenard Galloway [facility], RDCS Referring Phys: [id number] [place]  1. EF improved since TAVR procedure . Left ventricular ejection fraction, by estimation, is 35 to 40%. The left ventricle has moderately decreased function. The left ventricle demonstrates global hypokinesis. There is  moderate left ventricular hypertrophy. Left ventricular diastolic parameters are consistent with Grade I diastolic dysfunction (impaired relaxation). Elevated left ventricular end-diastolic pressure.  2. Right ventricular systolic function is normal. The right ventricular size is normal.  3. Left atrial size was mildly dilated.  4. The mitral valve is normal in structure. No evidence of mitral valve regurgitation. No evidence of mitral stenosis.  5. 26 mm Sapien 3 TAVR valve Trivial central regurgitation. Mild PVL new since 01/13/22 with mild increase in gradients See image 57 I think these leaks were present  on day one post implant but not imaged well compared to current study . The aortic valve has been repaired/replaced. Aortic valve regurgitation is not visualized. No aortic stenosis is present. There is a 26 mm Edwards Sapien prosthetic (TAVR) valve present in the aortic position. Procedure Date: 01/12/22.  6. The inferior vena cava is normal in size with greater than 50% respiratory variability, suggesting right atrial pressure of 3 mmHg. FINDINGS  Left Ventricle: EF improved since TAVR procedure. Left ventricular ejection fraction, by estimation, is 35 to 40%. The left ventricle has moderately decreased function. The left ventricle demonstrates global hypokinesis. Global longitudinal strain performed but not reported based on interpreter judgement due to suboptimal tracking. The left ventricular internal cavity size was normal in size. There is moderate left ventricular hypertrophy. Left ventricular diastolic parameters are consistent with Grade I diastolic dysfunction (impaired relaxation). Elevated left ventricular end-diastolic pressure. Right Ventricle: The right ventricular size is normal. No increase in right ventricular wall thickness. Right ventricular systolic function is normal. Left Atrium: Left atrial size was mildly dilated. Right Atrium: Right atrial size was normal in size. Pericardium: There  is no evidence of pericardial effusion. Mitral Valve: The mitral valve is normal in structure. No evidence of mitral valve regurgitation. No evidence of mitral valve stenosis. Tricuspid Valve: The tricuspid valve is normal in structure. Tricuspid valve regurgitation is mild . No evidence of tricuspid stenosis. Aortic Valve: 26 mm Sapien 3 TAVR valve Trivial central regurgitation. Mild PVL new since 01/13/22 with mild increase in gradients See image 57 I think these leaks were present on day one post implant but not imaged well compared to current study. The aortic valve has been repaired/replaced. Aortic valve regurgitation is not visualized. No aortic stenosis is present. Aortic valve mean gradient measures 8.5 mmHg. Aortic valve peak gradient measures 15.8 mmHg. Aortic valve area, by VTI measures 2.41 cm. There is a 26 mm Edwards Sapien prosthetic, stented (TAVR) valve present in the aortic position. Procedure Date: 01/12/22. Pulmonic Valve: The pulmonic valve was normal in structure. Pulmonic valve regurgitation is mild. No evidence of pulmonic stenosis. Aorta: The aortic root is normal in size and structure. Venous: The inferior vena cava is normal in size with greater than 50% respiratory variability, suggesting right atrial pressure of 3 mmHg. IAS/Shunts: No atrial level shunt detected by color flow Doppler.  LEFT VENTRICLE PLAX 2D LVIDd:         4.10 cm   Diastology LVIDs:         3.30 cm   LV e' medial:    3.37 cm/s LV PW:         1.10 cm   LV E/e' medial:  19.6 LV IVS:        1.40 cm   LV e' lateral:   3.59 cm/s LVOT diam:     2.60 cm   LV E/e' lateral: 18.4 LV SV:         93 LV SV Index:   48        2D Longitudinal Strain LVOT Area:     5.31 cm  2D Strain GLS (A2C):   -10.1 %                          2D Strain GLS (A3C):   -8.8 %                          2D Strain  GLS (A4C):   -9.4 %                          2D Strain GLS Avg:     -9.4 % RIGHT VENTRICLE RV Basal diam:  4.20 cm RV Mid diam:    3.20 cm RV  S prime:     8.49 cm/s TAPSE (M-mode): 1.6 cm LEFT ATRIUM             Index        RIGHT ATRIUM           Index LA diam:        3.90 cm 2.02 cm/m   RA Area:     13.30 cm LA Vol (A2C):   47.3 ml 24.46 ml/m  RA Volume:   31.80 ml  16.45 ml/m LA Vol (A4C):   42.9 ml 22.19 ml/m LA Biplane Vol: 46.7 ml 24.15 ml/m  AORTIC VALVE AV Area (Vmax):    2.36 cm AV Area (Vmean):   2.19 cm AV Area (VTI):     2.41 cm AV Vmax:           199.00 cm/s AV Vmean:          135.250 cm/s AV VTI:            0.386 m AV Peak Grad:      15.8 mmHg AV Mean Grad:      8.5 mmHg LVOT Vmax:         88.30 cm/s LVOT Vmean:        55.700 cm/s LVOT VTI:          0.175 m LVOT/AV VTI ratio: 0.45  AORTA Ao Root diam: 3.00 cm Ao Asc diam:  3.30 cm MITRAL VALVE MV Area (PHT): 2.79 cm     SHUNTS MV Decel Time: 272 msec     Systemic VTI:  0.18 m MV E velocity: 66.00 cm/s   Systemic Diam: 2.60 cm MV A velocity: 119.00 cm/s MV E/A ratio:  0.55 Jenkins Rouge MD Electronically signed by Jenkins Rouge MD Signature Date/Time: 02/16/2022/4:08:00 PM    Final

## 2022-03-07 NOTE — Telephone Encounter (Signed)
Called and given appt for 12/20 at 2 pm at [facility] for bone survey. He verbalized understanding and appreciated the call.

## 2022-03-07 NOTE — Assessment & Plan Note (Signed)
I have reviewed his recent myeloma panel The test revealed IgG kappa MGUS The imaging study of his heart show possibility of cardiac amyloidosis secondary to TTR I informed the patient that I do not treat amyloidosis secondary to TTR but if he has primary amyloidosis, that would warrant treatment It is not clear whether his peripheral neuropathy is related to amyloidosis versus diabetes I recommend we proceed with blood work and imaging study first as well as 24-hour urine collection I will see him after the new year If the tests are still not clear, we will proceed with bone marrow aspirate and biopsy We discussed the stepwise approach and he is in agreement In the meantime, he will continue medical management for his cardiac disease

## 2022-03-08 NOTE — Progress Notes (Signed)
ADVANCED HEART FAILURE CLINIC NOTE  Referring Physician: [name], MD  Primary Care: [name], MD Primary Cardiologist:  HPI: Caleb [name] is a 71 y.o. male with HTN, T2DM, CKD3A, PAD s/p b/l popliteal artery stenting, carotid artery disease s/p b/l TCAR, HFrEF (LVEF 20%) 2/2 NICM and severe LFLG AS s/p TAVR in 01/11/22 presenting today to establish care. Mr. Lukes TTEs appear to have preserved or low normal LVEF until TTE in 10/28/21 was significant for reduction in LVEF from 55% to 20% with LFLG AS. During that time he was admitted for acute systolic heart failure requiring IV diuretics. Cath w/ non significant disease. After discharge he was started on low dose GDMT. He ultimately underwent TAVR with a 62m Edwards Sapien 3 on 01/11/22. During evaluation for TAVR he had a PYP scan and myeloma panel that were both inconclusive for cardiac amyloid. Today he presents to heart failure clinic for further evaluation of systolic heart failure & amyloidosis.   According to Mr. JNelishe has remained fairly active until he felt a significant decline in functional status around the time of his HFrEF diagnosis in August 2023. After TAVR intervention though he has once more felt significant improvement in exercise capacity, dyspnea, orthopnea and LE edema. He now reports to exercising multiple times every week (light weight lifting, cardio). He is concerned about possible amyloidosis. He does not have any family history of AL or ATTR amyloid; no carpal tunnel; he does have significant peripheral neuropathy but this has been present since his diagnosis of diabetes.   Activity level/exercise tolerance:  NYHA IIB Orthopnea:  Sleeps on 2 pillows Paroxysmal noctural dyspnea:  NO Chest pain/pressure:  NO Orthostatic lightheadedness:  NO Palpitations:  NO Lower extremity edema:  NO Presyncope/syncope:  NO Cough:  NO  Past Medical History:  Diagnosis Date   Carotid artery occlusion     Diabetes mellitus without complication (HCC)    Type II   Dyslipidemia 09/27/2012   Erectile dysfunction 09/27/2012   GERD (gastroesophageal reflux disease)    Hypercholesteremia    Hyperlipidemia 08/12/2012   Hypertension    Hyponatremia 08/14/2012   Neuropathy    Pancreatitis    Pancreatitis, acute 09/27/2012   Peripheral vascular disease ([place]    S/P 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 CBurt Knack& Bartle   Severe aortic stenosis    Vitamin D deficiency 06/23/2015    Current Outpatient Medications  Medication Sig Dispense Refill   amoxicillin (AMOXIL) 500 MG capsule Take 4 capsules (2,000 mg total) by mouth as directed. Take 4 tablets 1 hour prior to dental work, including cleanings. 12 capsule 12   aspirin EC 81 MG tablet Take 81 mg by mouth in the morning.     atorvastatin (LIPITOR) 40 MG tablet Take 40 mg by mouth in the morning.     carvedilol (COREG) 12.5 MG tablet Take 1 tablet (12.5 mg total) by mouth 2 (two) times daily with a meal. 60 tablet 0   Empagliflozin-metFORMIN HCl ER (SYNJARDY XR) 25-1000 MG TB24 Take 1 tablet by mouth in the morning.     ferrous sulfate 325 (65 FE) MG tablet Take 325 mg by mouth in the morning.     furosemide (LASIX) 40 MG tablet Take 1 tablet (40 mg total) by mouth daily. 30 tablet 0   gabapentin (NEURONTIN) 800 MG tablet Take 800 mg by mouth at bedtime as needed (  Sleep).     hydrALAZINE (APRESOLINE) 25 MG tablet Take 1 tablet (25 mg total) by mouth 3 (three) times daily. 90 tablet 3   insulin NPH-regular Human (70-30) 100 UNIT/ML injection Inject 8 Units into the skin daily with breakfast. 7 units with dinner     isosorbide mononitrate (IMDUR) 30 MG 24 hr tablet Take 1 tablet (30 mg total) by mouth daily. 30 tablet 3   PRESCRIPTION MEDICATION Inject 0.1-1 mLs as directed See admin instructions. Tri-Mix Standard Strength 5 ml. Formula: Prostaglandin 10 mcg/ml,  Papaverine 30 mg/ml, Phentolamine 1 mg/ml. Inject 0.1 ml into side of penis as directed as needed (to be injected immediately before sexual intercourse) may increase the dose by 0.1 ml every 48 hours to achieve an erection. Max dose 1 ml.     vitamin B-12 (CYANOCOBALAMIN) 500 MCG tablet Take 500 mcg by mouth in the morning.     sacubitril-valsartan (ENTRESTO) 49-51 MG Take 1 tablet by mouth 2 (two) times daily. 60 tablet 11   Current Facility-Administered Medications  Medication Dose Route Frequency Provider Last Rate Last Admin   sodium chloride flush (NS) 0.9 % injection 3 mL  3 mL Intravenous Q12H Lorretta Harp, MD       sodium chloride flush (NS) 0.9 % injection 3 mL  3 mL Intravenous Q12H Lorretta Harp, MD        Allergies  Allergen Reactions   Codeine Nausea And Vomiting   Percocet [Oxycodone-Acetaminophen] Nausea And Vomiting      Social History   Socioeconomic History   Marital status: Widowed    Spouse name: Not on file   Number of children: 3   Years of education: Not on file   Highest education level: High school graduate  Occupational History   Occupation: Caregiver    Comment: Special needs    Comment: retired  Tobacco Use   Smoking status: Never   Smokeless tobacco: Never  Vaping Use   Vaping Use: Never used  Substance and Sexual Activity   Alcohol use: Yes    Comment: occasional   Drug use: No   Sexual activity: Not Currently  Other Topics Concern   Not on file  Social History Narrative   Widower.  3 daughters and 3 grandchildren.     Social Determinants of Health   Financial Resource Strain: Low Risk  (10/29/2021)   Overall Financial Resource Strain (CARDIA)    Difficulty of Paying Living Expenses: Not hard at all  Food Insecurity: No Food Insecurity (01/14/2022)   Hunger Vital Sign    Worried About Running Out of Food in the Last Year: Never true    Ran Out of Food in the Last Year: Never true  Transportation Needs: No Transportation Needs  (01/14/2022)   PRAPARE - Hydrologist (Medical): No    Lack of Transportation (Non-Medical): No  Physical Activity: Sufficiently Active (12/06/2020)   Exercise Vital Sign    Days of Exercise per Week: 4 days    Minutes of Exercise per Session: 60 min  Stress: No Stress Concern Present (12/06/2020)   [place]    Feeling of Stress : Not at all  Social Connections: Moderately Isolated (12/06/2020)   Social Connection and Isolation Panel [NHANES]    Frequency of Communication with Friends and Family: More than three times a week    Frequency of Social Gatherings with Friends and Family: More than three times a  week    Attends Religious Services: More than 4 times per year    Active Member of Clubs or Organizations: No    Attends Archivist Meetings: Never    Marital Status: Widowed  Intimate Partner Violence: Not At Risk (01/12/2022)   Humiliation, Afraid, Rape, and Kick questionnaire    Fear of Current or Ex-Partner: No    Emotionally Abused: No    Physically Abused: No    Sexually Abused: No      Family History  Problem Relation Age of Onset   CAD Father    Hypertension Father    Alcohol abuse Father        Cause of death   Diabetes Mother    Colon polyps Mother    CAD Brother 69       CABG   Colon cancer Neg Hx    Esophageal cancer Neg Hx    Rectal cancer Neg Hx    Stomach cancer Neg Hx     PHYSICAL EXAM: Vitals:   03/07/22 1107  BP: 120/80  Pulse: 71  SpO2: 98%   GENERAL: Well nourished, well developed, and in no apparent distress at rest.  HEENT: Negative for arcus senilis or xanthelasma. There is no scleral icterus.  The mucous membranes are pink and moist.   NECK: Supple, No masses. Normal carotid upstrokes without bruits. No masses or thyromegaly.    CHEST: There are no chest wall deformities. There is no chest wall tenderness. Respirations are unlabored.   Lungs- CTA B/L, no crackles CARDIAC:  JVP: 7 cm H2O         Normal S1, S2  Normal rate with regular rhythm. No murmurs, rubs or gallops.  Pulses are 2+ and symmetrical in upper and lower extremities. No edema.  ABDOMEN: Soft, non-tender, non-distended. There are no masses or hepatomegaly. There are normal bowel sounds.  EXTREMITIES: Warm and well perfused with no cyanosis, clubbing.  LYMPHATIC: No axillary or supraclavicular lymphadenopathy.  NEUROLOGIC: Patient is oriented x3 with no focal or lateralizing neurologic deficits.  PSYCH: Patients affect is appropriate, there is no evidence of anxiety or depression.  SKIN: Warm and dry; no lesions or wounds.   DATA REVIEW  ECG: 02/05/22: NSR with LVH and repol abnormalities   ECHO: 02/16/22: LVEF 35%-40%, G1 DD, normal RV function; TAVR valve in place 10/28/21: LVEF 20%, Grade II diastolic dysfunction, RVF moderately reduced.  12/21/20: LVEF 50-55%, moderate AS, normal RV function 08/27/19: LVEF 40-45%, RV normal, moderate-severe AS  CATH: 10/29/21:  Prox RCA lesion is 60% stenosed.   Ramus lesion is 40% stenosed.   2nd Mrg lesion is 60% stenosed.   Mid LAD lesion is 30% stenosed.   Dist LAD lesion is 40% stenosed.   Mild non-obstructive disease in the mid and distal LAD Mild non-obstructive disease in the Circumflex and Ramus intermediate branches The RCA is a large dominant vessel with moderate proximal to mid stenosis that does not appear to be flow limiting.  Non-ischemic cardiomyopathy Severe low flow/low gradient aortic stenosis by echo  Elevated right heart pressures.    ASSESSMENT & PLAN:  Chronic systolic heart failure Etiology of HF: Likely valvular vs hypertensive; significant LVH on TTE that I do not believe to be secondary to cardiac amyloid.  NYHA class / AHA Stage: significant improvement after TAVR, NYHA II now; exercising multiple times per week in the gym.  Volume status & Diuretics: Euvolemic, continue lasix 42m  daily Vasodilators:Increase Entresto to 49/572mBID Beta-Blocker:coreg 12.53m107m  BID MRA:Hx of hyperkalemia, will start low dose spiro on follow up as renal function continues to improve.  Cardiometabolic:Jardiance 34KA daily Devices therapies & Valvulopathies:s/p TAVR Advanced therapies:N/A  2. Cardiac amyloid evaluation  - On my evaluation, PYP scan is at most Grade 1 uptake and is not suggestive of ATTR cardiac amyloid. He has no family history of the disease either. LVH on TTE likely due to hypertension and aortic stenosis. EKG also consistent with LVH.  - Multiple myeloma panel is also not suggestive of amyloid. Although he has a IgG/MGUS, this is likely due to underlying CKD. Will allow hematology to evaluate further.   3. AS s/p TAVR - significant improvement in functional status after TAVR. Will continue to uptitrate GDMT; I expect recovery of LV function.   3. CAD  - atorvastatin  - ASA  4. CKD - renal function continuing to improve.   5. PAD - significant PAD w/ b/l popliteal stenting - Followed by Dr. Roseanna Rainbow Advanced Heart Failure Mechanical Circulatory Support

## 2022-03-09 ENCOUNTER — Inpatient Hospital Stay: Payer: Medicare HMO

## 2022-03-09 ENCOUNTER — Ambulatory Visit (HOSPITAL_COMMUNITY)
Admission: RE | Admit: 2022-03-09 | Discharge: 2022-03-09 | Disposition: A | Discharge status: Home / Self Care | Payer: Medicare HMO | Source: Ambulatory Visit | Attending: Hematology and Oncology | Admitting: Hematology and Oncology

## 2022-03-09 ENCOUNTER — Other Ambulatory Visit: Payer: Self-pay

## 2022-03-09 ENCOUNTER — Other Ambulatory Visit: Payer: Self-pay | Admitting: *Deleted

## 2022-03-09 DIAGNOSIS — E8582 Wild-type transthyretin-related (ATTR) amyloidosis: Secondary | ICD-10-CM | POA: Insufficient documentation

## 2022-03-09 DIAGNOSIS — Z794 Long term (current) use of insulin: Secondary | ICD-10-CM | POA: Diagnosis not present

## 2022-03-09 DIAGNOSIS — D472 Monoclonal gammopathy: Secondary | ICD-10-CM | POA: Insufficient documentation

## 2022-03-09 DIAGNOSIS — N183 Chronic kidney disease, stage 3 unspecified: Secondary | ICD-10-CM | POA: Diagnosis not present

## 2022-03-09 DIAGNOSIS — E1122 Type 2 diabetes mellitus with diabetic chronic kidney disease: Secondary | ICD-10-CM | POA: Diagnosis not present

## 2022-03-09 DIAGNOSIS — C9000 Multiple myeloma not having achieved remission: Secondary | ICD-10-CM | POA: Diagnosis not present

## 2022-03-09 DIAGNOSIS — F1090 Alcohol use, unspecified, uncomplicated: Secondary | ICD-10-CM | POA: Diagnosis not present

## 2022-03-09 DIAGNOSIS — K861 Other chronic pancreatitis: Secondary | ICD-10-CM | POA: Diagnosis not present

## 2022-03-09 LAB — CBC WITH DIFFERENTIAL (CANCER CENTER ONLY)
Abs Immature Granulocytes: 0.01 10*3/uL (ref 0.00–0.07)
Basophils Absolute: 0 10*3/uL (ref 0.0–0.1)
Basophils Relative: 0 %
Eosinophils Absolute: 0.2 10*3/uL (ref 0.0–0.5)
Eosinophils Relative: 3 %
HCT: 43.7 % (ref 39.0–52.0)
Hemoglobin: 14.6 g/dL (ref 13.0–17.0)
Immature Granulocytes: 0 %
Lymphocytes Relative: 25 %
Lymphs Abs: 1.3 10*3/uL (ref 0.7–4.0)
MCH: 27.8 pg (ref 26.0–34.0)
MCHC: 33.4 g/dL (ref 30.0–36.0)
MCV: 83.2 fL (ref 80.0–100.0)
Monocytes Absolute: 0.5 10*3/uL (ref 0.1–1.0)
Monocytes Relative: 9 %
Neutro Abs: 3.3 10*3/uL (ref 1.7–7.7)
Neutrophils Relative %: 63 %
Platelet Count: 216 10*3/uL (ref 150–400)
RBC: 5.25 MIL/uL (ref 4.22–5.81)
RDW: 15.7 % — ABNORMAL HIGH (ref 11.5–15.5)
WBC Count: 5.3 10*3/uL (ref 4.0–10.5)
nRBC: 0 % (ref 0.0–0.2)

## 2022-03-09 LAB — CMP (CANCER CENTER ONLY)
ALT: 51 U/L — ABNORMAL HIGH (ref 0–44)
AST: 41 U/L (ref 15–41)
Albumin: 3.7 g/dL (ref 3.5–5.0)
Alkaline Phosphatase: 178 U/L — ABNORMAL HIGH (ref 38–126)
Anion gap: 6 (ref 5–15)
BUN: 25 mg/dL — ABNORMAL HIGH (ref 8–23)
CO2: 26 mmol/L (ref 22–32)
Calcium: 9.3 mg/dL (ref 8.9–10.3)
Chloride: 104 mmol/L (ref 98–111)
Creatinine: 1.36 mg/dL — ABNORMAL HIGH (ref 0.61–1.24)
GFR, Estimated: 56 mL/min — ABNORMAL LOW (ref >60)
Glucose, Bld: 201 mg/dL — ABNORMAL HIGH (ref 70–99)
Potassium: 3.9 mmol/L (ref 3.5–5.1)
Sodium: 136 mmol/L (ref 135–145)
Total Bilirubin: 0.4 mg/dL (ref 0.3–1.2)
Total Protein: 7.4 g/dL (ref 6.5–8.1)

## 2022-03-09 LAB — LACTATE DEHYDROGENASE: LDH: 220 U/L — ABNORMAL HIGH (ref 98–192)

## 2022-03-09 LAB — VITAMIN D 25 HYDROXY (VIT D DEFICIENCY, FRACTURES): Vit D, 25-Hydroxy: 36.46 ng/mL (ref 30–100)

## 2022-03-10 LAB — KAPPA/LAMBDA LIGHT CHAINS
Kappa free light chain: 45.6 mg/L — ABNORMAL HIGH (ref 3.3–19.4)
Kappa, lambda light chain ratio: 1.31 (ref 0.26–1.65)
Lambda free light chains: 34.8 mg/L — ABNORMAL HIGH (ref 5.7–26.3)

## 2022-03-10 LAB — BETA 2 MICROGLOBULIN, SERUM: Beta-2 Microglobulin: 2.4 mg/L (ref 0.6–2.4)

## 2022-03-11 LAB — MULTIPLE MYELOMA PANEL, SERUM
Albumin SerPl Elph-Mcnc: 3.5 g/dL (ref 2.9–4.4)
Albumin/Glob SerPl: 1.1 (ref 0.7–1.7)
Alpha 1: 0.3 g/dL (ref 0.0–0.4)
Alpha2 Glob SerPl Elph-Mcnc: 0.6 g/dL (ref 0.4–1.0)
B-Globulin SerPl Elph-Mcnc: 1 g/dL (ref 0.7–1.3)
Gamma Glob SerPl Elph-Mcnc: 1.4 g/dL (ref 0.4–1.8)
Globulin, Total: 3.2 g/dL (ref 2.2–3.9)
IgA: 373 mg/dL (ref 61–437)
IgG (Immunoglobin G), Serum: 1558 mg/dL (ref 603–1613)
IgM (Immunoglobulin M), Srm: 70 mg/dL (ref 15–143)
M Protein SerPl Elph-Mcnc: 0.2 g/dL — ABNORMAL HIGH
Total Protein ELP: 6.7 g/dL (ref 6.0–8.5)

## 2022-03-11 LAB — UPEP/UIFE/LIGHT CHAINS/TP, 24-HR UR
% BETA, Urine: 11.1 %
ALPHA 1 URINE: 5.6 %
Albumin, U: 67.5 %
Alpha 2, Urine: 6.4 %
Free Kappa Lt Chains,Ur: 129.36 mg/L — ABNORMAL HIGH (ref 1.17–86.46)
Free Kappa/Lambda Ratio: 4.15 (ref 1.83–14.26)
Free Lambda Lt Chains,Ur: 31.15 mg/L — ABNORMAL HIGH (ref 0.27–15.21)
GAMMA GLOBULIN URINE: 9.3 %
Total Protein, Urine-Ur/day: 2146 mg/24 hr — ABNORMAL HIGH (ref 30–150)
Total Protein, Urine: 75.3 mg/dL
Total Volume: 2850

## 2022-03-18 NOTE — Progress Notes (Signed)
  Care Coordination Note  03/18/2022 Name: [name] MRN: [id number] DOB: 11/17/1950  Caleb [name] is a 71 y.o. year old male who is a primary care patient of [name], MD and is actively engaged with the care management team. I reached out to [name] by phone today to assist with scheduling an initial visit with the RN Case Manager  Follow up plan: Telephone appointment with care management team member scheduled for:03/31/22  [place]: [phone]

## 2022-03-23 ENCOUNTER — Other Ambulatory Visit: Payer: Self-pay

## 2022-03-23 ENCOUNTER — Telehealth: Payer: Self-pay

## 2022-03-23 ENCOUNTER — Inpatient Hospital Stay: Payer: Medicare HMO | Attending: Hematology and Oncology

## 2022-03-23 DIAGNOSIS — D472 Monoclonal gammopathy: Secondary | ICD-10-CM | POA: Diagnosis not present

## 2022-03-23 NOTE — Telephone Encounter (Signed)
Called and scheduled lab appt today at 4 pm for TTR that was not collected. Moved appt with Dr. Alvy Bimler 1/23 at 11 am. He verbalized understanding.

## 2022-03-29 ENCOUNTER — Inpatient Hospital Stay: Payer: Medicare HMO | Admitting: Hematology and Oncology

## 2022-03-31 ENCOUNTER — Ambulatory Visit: Payer: Self-pay

## 2022-03-31 NOTE — Patient Outreach (Signed)
  Care Coordination   03/31/2022 Name: Caleb [name] MRN: [id number] DOB: [birth year]   Care Coordination Outreach Attempts:  An unsuccessful telephone outreach was attempted for a scheduled appointment today.  Follow Up Plan:  Additional outreach attempts will be made to offer the patient care coordination information and services.   Encounter Outcome:  Pt. Request to Call Back   Care Coordination Interventions:  No, not indicated    Barb Merino, RN, BSN, CCM Care Management Coordinator [place] Management Direct Phone: [phone]

## 2022-04-05 ENCOUNTER — Ambulatory Visit: Payer: Self-pay

## 2022-04-05 NOTE — Patient Outreach (Signed)
  Care Coordination   Initial Visit Note   04/05/2022 Name: Caleb [name] MRN: [id number] DOB: 12-25-1950  Caleb [name] is a 72 y.o. year old male who sees [name], MD for primary care. I spoke with  Caleb [name] by phone today.  What matters to the patients health and wellness today?  Patient will self monitor his BP and record. He will report abnormal readings and shortness of breath to his Cardiologist.     Goals Addressed               This Visit's Progress     Patient Stated     To be active again without shortness of breath (pt-stated)        Care Coordination Interventions: Basic overview and discussion of pathophysiology of Heart Failure reviewed Reviewed Heart Failure Action Plan in depth and provided written copy Provided education about placing scale on hard, flat surface Advised patient to weigh each morning after emptying bladder Discussed importance of daily weight and advised patient to weigh and record daily Reviewed role of diuretics in prevention of fluid overload and management of heart failure; Advised patient to discuss shortness of breath with provider Determined patient feels he may be running a low BP Educated patient regarding target BP Advised patient, providing education and rationale, to monitor blood pressure daily and record, calling PCP for findings outside established parameters Educated patient on how to accurately measure his BP at home            Other     COMPLETED: Care Coordination Activities: further follow up needed        Care Coordination Interventions: Reviewed chart in preparation to contact patient, noted patient was admitted to [facility] on 01/11/22 with dx: S/P TAVR (transcatheter aortic valve replacement           SDOH assessments and interventions completed:  No     Care Coordination Interventions:  Yes, provided   Follow up plan: Follow up call scheduled for 04/27/22 '@2'$ :00 PM     Encounter Outcome:  Pt. Visit Completed

## 2022-04-05 NOTE — Patient Instructions (Signed)
Visit Information  Thank you for taking time to visit with me today. Please don't hesitate to contact me if I can be of assistance to you.   Following are the goals we discussed today:   Goals Addressed               This Visit's Progress     Patient Stated     To be active again without shortness of breath (pt-stated)        Care Coordination Interventions: Basic overview and discussion of pathophysiology of Heart Failure reviewed Reviewed Heart Failure Action Plan in depth and provided written copy Provided education about placing scale on hard, flat surface Advised patient to weigh each morning after emptying bladder Discussed importance of daily weight and advised patient to weigh and record daily Reviewed role of diuretics in prevention of fluid overload and management of heart failure; Advised patient to discuss shortness of breath with provider Determined patient feels he may be running a low BP Educated patient regarding target BP Advised patient, providing education and rationale, to monitor blood pressure daily and record, calling PCP for findings outside established parameters Educated patient on how to accurately measure his BP at home            Other     COMPLETED: Care Coordination Activities: further follow up needed        Care Coordination Interventions: Reviewed chart in preparation to contact patient, noted patient was admitted to [facility] on 01/11/22 with dx: S/P TAVR (transcatheter aortic valve replacement           Our next appointment is by telephone on 04/27/22 at 2:00 PM  Please call the care guide team at [phone] if you need to cancel or reschedule your appointment.   If you are experiencing a Mental Health or [place] or need someone to talk to, please go to [facility] [address] [place] [phone])  Patient verbalizes understanding of instructions and care  plan provided today and agrees to view in [place]. Active MyChart status and patient understanding of how to access instructions and care plan via MyChart confirmed with patient.     Barb Merino, RN, BSN, CCM Care Management Coordinator [facility] Care Management  Direct Phone: [phone]

## 2022-04-06 LAB — MISC LABCORP TEST (SEND OUT): Labcorp test code: 482353

## 2022-04-12 ENCOUNTER — Telehealth: Payer: Self-pay | Admitting: Hematology and Oncology

## 2022-04-12 ENCOUNTER — Inpatient Hospital Stay: Payer: Medicare HMO | Admitting: Hematology and Oncology

## 2022-04-12 NOTE — Telephone Encounter (Signed)
Patient called to r/s missed appointment from today 1/23. R/s patient to next available appointment opening.

## 2022-04-18 ENCOUNTER — Other Ambulatory Visit: Payer: Self-pay | Admitting: *Deleted

## 2022-04-18 DIAGNOSIS — I6523 Occlusion and stenosis of bilateral carotid arteries: Secondary | ICD-10-CM

## 2022-04-18 NOTE — Progress Notes (Incomplete)
***In Progress***    Advanced Heart Failure Clinic Note   Referring Physician: [name], MD  Primary Care: [name], MD HF-Cardiologist: Hebert Soho, DO   HPI: Caleb [name] is a 72 y.o. male with HTN, T2DM, CKD3A, PAD s/p b/l popliteal artery stenting, carotid artery disease s/p b/l TCAR, HFrEF (LVEF 20%) 2/2 NICM and severe LFLG AS s/p TAVR in 01/11/22. [name] TTEs appear to have preserved or low normal LVEF until TTE in 10/28/21 was significant for reduction in LVEF from 55% to 20% with LFLG AS. During that time he was admitted for acute systolic heart failure requiring IV diuretics. Cath w/ non significant disease. After discharge he was started on low dose GDMT. He ultimately underwent TAVR with a 42m Edwards Sapien 3 on 01/11/22. During evaluation for TAVR he had a PYP scan and myeloma panel that were both inconclusive for cardiac amyloid  Presents to heart failure clinic on 03/07/22 for further evaluation of systolic heart failure & amyloidosis. According to [name] had remained fairly active until he felt a significant decline in functional status around the time of his HFrEF diagnosis in August 2023. After TAVR intervention he once more felt significant improvement in exercise capacity, dyspnea, orthopnea and LE edema.Reported exercising multiple times every week (light weight lifting, cardio). He was concerned about possible amyloidosis. He does not have any family history of AL or ATTR amyloid; no carpal tunnel; reported significant peripheral neuropathy but this has been present since his diagnosis of diabetes.    Today he returns to HF clinic for pharmacist medication titration. At last visit with MD EDelene Lollincreased to 49/51 mg BID.   Overall feeling ***. Dizziness, lightheadedness, fatigue:  Chest pain or palpitations:  How is your breathing?: *** SOB: Able to complete all ADLs. Activity level ***  Weight at home pounds. Takes  furosemide/torsemide/bumex *** mg *** daily.  LEE PND/Orthopnea  Appetite *** Low-salt diet:   Physical Exam Cost/affordability of meds   HF Medications: Carvedilol 12.5 mg BID Entresto 49/51 mg BID Empagliflozin/metformin 25/1000 mg daily Hydralazine 25 mg TID Isosorbide mononitrate 30 mg daily Lasix 40 mg daily  Has the patient been experiencing any side effects to the medications prescribed?  {YES NO:22349}  Does the patient have any problems obtaining medications due to transportation or finances?   {YES NO:22349}  Understanding of regimen: {excellent/good/fair/poor:19665} Understanding of indications: {excellent/good/fair/poor:19665} Potential of compliance: {excellent/good/fair/poor:19665} Patient understands to avoid NSAIDs. Patient understands to avoid decongestants.    Pertinent Lab Values: Serum creatinine ***, BUN ***, Potassium ***, Sodium ***, BNP ***, Magnesium ***, Digoxin ***   Vital Signs: Weight: *** (last clinic weight: ***) Blood pressure: ***  Heart rate: ***   Assessment/Plan: 1. Chronic systolic heart failure Etiology of HF: Likely valvular vs hypertensive; significant LVH on TTE likely not secondary to cardiac amyloid.  NYHA class / AHA Stage: significant improvement after TAVR, NYHA II now; exercising multiple times per week in the gym.  Volume status & Diuretics: Euvolemic, continue Lasix 40 mg daily Vasodilators: Increase Entresto to 49/51 mg BID Beta-Blocker: continue carvedilol 12.5 mg BID MRA: Hx of hyperkalemia, will start low dose spironolactone on follow up as renal function continues to improve.  Cardiometabolic: Continue empagliflozin/metformin 25/1000 mg daily Devices therapies & Valvulopathies: s/p TAVR Advanced therapies: N/A   2. Cardiac amyloid evaluation  - On my evaluation, PYP scan is at most Grade 1 uptake and is not suggestive of ATTR cardiac amyloid. He has no family history of  the disease either. LVH on TTE likely due to  hypertension and aortic stenosis. EKG also consistent with LVH.  - Multiple myeloma panel is also not suggestive of amyloid. Although he has a IgG/MGUS, this is likely due to underlying CKD. Will allow hematology to evaluate further.    3. AS s/p TAVR - significant improvement in functional status after TAVR. Will continue to uptitrate GDMT; I expect recovery of LV function.    3. CAD  - continue atorvastatin 40 mg daily - continue aspirin 81 mg daily   4. CKD - renal function continuing to improve.    5. PAD - significant PAD w/ b/l popliteal stenting - Followed by Dr. Gwenlyn Found  Follow up in 6 weeks with MD.   Audry Riles, PharmD, BCPS, BCCP, CPP Heart Failure Clinic Pharmacist [phone]

## 2022-04-19 ENCOUNTER — Inpatient Hospital Stay (HOSPITAL_COMMUNITY)
Admission: RE | Admit: 2022-04-19 | Discharge: 2022-04-19 | Disposition: A | Discharge status: Home / Self Care | Payer: Medicare HMO | Source: Ambulatory Visit

## 2022-04-19 ENCOUNTER — Other Ambulatory Visit (HOSPITAL_COMMUNITY): Payer: Self-pay

## 2022-04-22 ENCOUNTER — Inpatient Hospital Stay: Payer: Medicare HMO | Admitting: Hematology and Oncology

## 2022-04-25 ENCOUNTER — Encounter: Payer: Self-pay | Admitting: Family Medicine

## 2022-04-25 ENCOUNTER — Encounter: Payer: Self-pay | Admitting: Cardiovascular Disease

## 2022-04-25 ENCOUNTER — Ambulatory Visit: Payer: Medicare HMO | Attending: Family Medicine | Admitting: Family Medicine

## 2022-04-25 VITALS — BP 118/76 | HR 78 | Temp 98.0°F | Ht 67.0 in | Wt 186.4 lb

## 2022-04-25 DIAGNOSIS — E785 Hyperlipidemia, unspecified: Secondary | ICD-10-CM | POA: Diagnosis not present

## 2022-04-25 DIAGNOSIS — I5042 Chronic combined systolic (congestive) and diastolic (congestive) heart failure: Secondary | ICD-10-CM

## 2022-04-25 DIAGNOSIS — I739 Peripheral vascular disease, unspecified: Secondary | ICD-10-CM | POA: Diagnosis not present

## 2022-04-25 DIAGNOSIS — Z794 Long term (current) use of insulin: Secondary | ICD-10-CM

## 2022-04-25 DIAGNOSIS — I152 Hypertension secondary to endocrine disorders: Secondary | ICD-10-CM | POA: Diagnosis not present

## 2022-04-25 DIAGNOSIS — I110 Hypertensive heart disease with heart failure: Secondary | ICD-10-CM

## 2022-04-25 DIAGNOSIS — E1169 Type 2 diabetes mellitus with other specified complication: Secondary | ICD-10-CM

## 2022-04-25 DIAGNOSIS — E1159 Type 2 diabetes mellitus with other circulatory complications: Secondary | ICD-10-CM

## 2022-04-25 DIAGNOSIS — Z952 Presence of prosthetic heart valve: Secondary | ICD-10-CM

## 2022-04-25 LAB — POCT GLYCOSYLATED HEMOGLOBIN (HGB A1C): HbA1c, POC (controlled diabetic range): 7.9 % — AB (ref 0.0–7.0)

## 2022-04-25 NOTE — Progress Notes (Signed)
Subjective:  Patient ID: Caleb [name], male    DOB: November 10, 1950  Age: 72 y.o. MRN: [id number]  CC: Diabetes   HPI [name] is a 72 y.o. year old male with a history of type 2 diabetes mellitus (A1c 7.9), hypertension, hyperlipidemia, s/p L 4th and 5th  toe metatarsal amputation, PAD status post b/l popliteal artery intervention, bilateral carotid artery stenosis status post bilateral  transcarotid artery revascularization (in 11/2019 and 01/2020), HFrEF (EF 20%, global hypokinesis, grade 2 DD, down from 50-55% previously) AVR aortic valve stenosis s/p TVAR from 10/2021  , b/l carotid stenosis (status post TCAR)     Interval History:  He had his Cardiology visit last week and Lasix dose was increased for a few days and he has since reverted back to '40mg'$ . He states he feels better with less dyspnea when he takes '80mg'$  of Lasix but some dyspnea occurs when he takes '40mg'$ .  He has no orthopnea or paroxysmal nocturnal dyspnea but does have some intermittent ankle edema.  He has since resumed his Entresto.  He has an appointment for an echo in 4 days. He states he is able to exercise like he usually does. A1c is 7.9 down from 8.2. Fasting sugars average 135.  He endorses adherence with his diabetes regimen and follows up with his endocrinologist at the [place]. Past Medical History:  Diagnosis Date   Carotid artery occlusion    Diabetes mellitus without complication ([place])    Type II   Dyslipidemia 09/27/2012   Erectile dysfunction 09/27/2012   GERD (gastroesophageal reflux disease)    Hypercholesteremia    Hyperlipidemia 08/12/2012   Hypertension    Hyponatremia 08/14/2012   Neuropathy    Pancreatitis    Pancreatitis, acute 09/27/2012   Peripheral vascular disease ([place])    S/P 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 Burt Knack & Bartle   Severe aortic stenosis    Vitamin D deficiency 06/23/2015    Past  Surgical History:  Procedure Laterality Date   ABDOMINAL AORTOGRAM W/LOWER EXTREMITY N/A 08/08/2019   Procedure: ABDOMINAL AORTOGRAM W/LOWER EXTREMITY;  Surgeon: Lorretta Harp, MD;  Location: [facility];  Service: Cardiovascular;  Laterality: N/A;   ABDOMINAL AORTOGRAM W/LOWER EXTREMITY N/A 11/18/2019   Procedure: ABDOMINAL AORTOGRAM W/LOWER EXTREMITY;  Surgeon: Lorretta Harp, MD;  Location: [facility];  Service: Cardiovascular;  Laterality: N/A;   CARDIAC CATHETERIZATION     COLONOSCOPY  12 years ago   in [place] clinic= normal exam per pt   INTRAOPERATIVE TRANSTHORACIC ECHOCARDIOGRAM N/A 01/11/2022   Procedure: INTRAOPERATIVE TRANSTHORACIC ECHOCARDIOGRAM;  Surgeon: Sherren Mocha, MD;  Location: [facility];  Service: Open Heart Surgery;  Laterality: N/A;   KNEE SURGERY     MULTIPLE EXTRACTIONS WITH ALVEOLOPLASTY N/A 12/09/2021   Procedure: MULTIPLE EXTRACTION;  Surgeon: Charlaine Dalton, DMD;  Location: [facility];  Service: Dentistry;  Laterality: N/A;   PERIPHERAL VASCULAR INTERVENTION Right 08/08/2019   Procedure: PERIPHERAL VASCULAR INTERVENTION;  Surgeon: Lorretta Harp, MD;  Location: [facility];  Service: Cardiovascular;  Laterality: Right;   PERIPHERAL VASCULAR INTERVENTION Left 11/18/2019   Procedure: PERIPHERAL VASCULAR INTERVENTION;  Surgeon: Lorretta Harp, MD;  Location: [facility];  Service: Cardiovascular;  Laterality: Left;  left popliteal artery   RIGHT/LEFT HEART CATH AND CORONARY ANGIOGRAPHY N/A 10/29/2021   Procedure: RIGHT/LEFT HEART CATH AND CORONARY ANGIOGRAPHY;  Surgeon: Burnell Blanks, MD;  Location: [facility];  Service: Cardiovascular;  Laterality: N/A;   TRANSCAROTID ARTERY REVASCULARIZATION  Left 12/16/2019   Procedure: TRANSCAROTID ARTERY REVASCULARIZATION;  Surgeon: Elam Dutch, MD;  Location: [facility];  Service: Vascular;  Laterality: Left;   TRANSCAROTID ARTERY REVASCULARIZATION  Right  02/03/2020   Procedure: RIGHT TRANSCAROTID ARTERY REVASCULARIZATION;  Surgeon: Elam Dutch, MD;  Location: [facility];  Service: Vascular;  Laterality: Right;   TRANSCATHETER AORTIC VALVE REPLACEMENT, TRANSFEMORAL N/A 01/11/2022   Procedure: Transcatheter Aortic Valve Replacement, Transfemoral;  Surgeon: Sherren Mocha, MD;  Location: [facility];  Service: Open Heart Surgery;  Laterality: N/A;   ULTRASOUND GUIDANCE FOR VASCULAR ACCESS Right 12/16/2019   Procedure: ULTRASOUND GUIDANCE FOR VASCULAR ACCESS;  Surgeon: Elam Dutch, MD;  Location: [facility];  Service: Vascular;  Laterality: Right;   ULTRASOUND GUIDANCE FOR VASCULAR ACCESS Right 02/03/2020   Procedure: ULTRASOUND GUIDANCE FOR VASCULAR ACCESS;  Surgeon: Elam Dutch, MD;  Location: [facility] OR;  Service: Vascular;  Laterality: Right;   VASECTOMY      Family History  Problem Relation Age of Onset   CAD Father    Hypertension Father    Alcohol abuse Father        Cause of death   Diabetes Mother    Colon polyps Mother    CAD Brother 48       CABG   Colon cancer Neg Hx    Esophageal cancer Neg Hx    Rectal cancer Neg Hx    Stomach cancer Neg Hx     Social History   Socioeconomic History   Marital status: Widowed    Spouse name: Not on file   Number of children: 3   Years of education: Not on file   Highest education level: High school graduate  Occupational History   Occupation: Caregiver    Comment: Special needs    Comment: retired  Tobacco Use   Smoking status: Never   Smokeless tobacco: Never  Vaping Use   Vaping Use: Never used  Substance and Sexual Activity   Alcohol use: Yes    Comment: occasional   Drug use: No   Sexual activity: Not Currently  Other Topics Concern   Not on file  Social History Narrative   Widower.  3 daughters and 3 grandchildren.     Social Determinants of Health   Financial Resource Strain: Low Risk  (10/29/2021)   Overall Financial Resource Strain (CARDIA)     Difficulty of Paying Living Expenses: Not hard at all  Food Insecurity: No Food Insecurity (01/14/2022)   Hunger Vital Sign    Worried About Running Out of Food in the Last Year: Never true    Ran Out of Food in the Last Year: Never true  Transportation Needs: No Transportation Needs (01/14/2022)   PRAPARE - Hydrologist (Medical): No    Lack of Transportation (Non-Medical): No  Physical Activity: Sufficiently Active (12/06/2020)   Exercise Vital Sign    Days of Exercise per Week: 4 days    Minutes of Exercise per Session: 60 min  Stress: No Stress Concern Present (12/06/2020)   [place]    Feeling of Stress : Not at all  Social Connections: Moderately Isolated (12/06/2020)   Social Connection and Isolation Panel [NHANES]    Frequency of Communication with Friends and Family: More than three times a week  Frequency of Social Gatherings with Friends and Family: More than three times a week    Attends Religious Services: More than 4 times per year    Active Member of Clubs or Organizations: No    Attends Archivist Meetings: Never    Marital Status: Widowed    Allergies  Allergen Reactions   Codeine Nausea And Vomiting   Percocet [Oxycodone-Acetaminophen] Nausea And Vomiting    Outpatient Medications Prior to Visit  Medication Sig Dispense Refill   aspirin EC 81 MG tablet Take 81 mg by mouth in the morning.     atorvastatin (LIPITOR) 40 MG tablet Take 40 mg by mouth in the morning.     carvedilol (COREG) 12.5 MG tablet Take 1 tablet (12.5 mg total) by mouth 2 (two) times daily with a meal. 60 tablet 0   Empagliflozin-metFORMIN HCl ER (SYNJARDY XR) 25-1000 MG TB24 Take 1 tablet by mouth in the morning.     ferrous sulfate 325 (65 FE) MG tablet Take 325 mg by mouth in the morning.     furosemide (LASIX) 40 MG tablet Take 1 tablet (40 mg total) by mouth daily. 30 tablet 0    gabapentin (NEURONTIN) 800 MG tablet Take 800 mg by mouth at bedtime as needed (Sleep).     hydrALAZINE (APRESOLINE) 25 MG tablet Take 1 tablet (25 mg total) by mouth 3 (three) times daily. 90 tablet 3   insulin NPH-regular Human (70-30) 100 UNIT/ML injection Inject 8 Units into the skin daily with breakfast. 7 units with dinner     isosorbide mononitrate (IMDUR) 30 MG 24 hr tablet Take 1 tablet (30 mg total) by mouth daily. 30 tablet 3   PRESCRIPTION MEDICATION Inject 0.1-1 mLs as directed See admin instructions. Tri-Mix Standard Strength 5 ml. Formula: Prostaglandin 10 mcg/ml, Papaverine 30 mg/ml, Phentolamine 1 mg/ml. Inject 0.1 ml into side of penis as directed as needed (to be injected immediately before sexual intercourse) may increase the dose by 0.1 ml every 48 hours to achieve an erection. Max dose 1 ml.     sacubitril-valsartan (ENTRESTO) 49-51 MG Take 1 tablet by mouth 2 (two) times daily. 60 tablet 11   vitamin B-12 (CYANOCOBALAMIN) 500 MCG tablet Take 500 mcg by mouth in the morning.     amoxicillin (AMOXIL) 500 MG capsule Take 4 capsules (2,000 mg total) by mouth as directed. Take 4 tablets 1 hour prior to dental work, including cleanings. (Patient not taking: Reported on 04/25/2022) 12 capsule 12   Facility-Administered Medications Prior to Visit  Medication Dose Route Frequency Provider Last Rate Last Admin   sodium chloride flush (NS) 0.9 % injection 3 mL  3 mL Intravenous Q12H Lorretta Harp, MD       sodium chloride flush (NS) 0.9 % injection 3 mL  3 mL Intravenous Q12H Lorretta Harp, MD         ROS Review of Systems  Constitutional:  Negative for activity change and appetite change.  HENT:  Negative for sinus pressure and sore throat.   Respiratory:  Positive for shortness of breath. Negative for chest tightness and wheezing.   Cardiovascular:  Negative for chest pain and palpitations.  Gastrointestinal:  Negative for abdominal distention, abdominal pain and  constipation.  Genitourinary: Negative.   Musculoskeletal: Negative.   Psychiatric/Behavioral:  Negative for behavioral problems and dysphoric mood.     Objective:  BP 118/76   Pulse 78   Temp 98 F (36.7 C) (Oral)   Ht '5\' 7"'$  (  1.702 m)   Wt 186 lb 6.4 oz (84.6 kg)   SpO2 99%   BMI 29.19 kg/m      04/25/2022   10:09 AM 03/07/2022    2:21 PM 03/07/2022   11:07 AM  BP/Weight  Systolic BP 657 846 962  Diastolic BP 76 77 80  Wt. (Lbs) 186.4 181.4 182.8  BMI 29.19 kg/m2 28.41 kg/m2 28.63 kg/m2      Physical Exam Constitutional:      Appearance: He is well-developed.  Neck:     Comments: Slight JVD Cardiovascular:     Rate and Rhythm: Normal rate.     Heart sounds: Normal heart sounds. No murmur heard. Pulmonary:     Effort: Pulmonary effort is normal.     Breath sounds: Normal breath sounds. No wheezing or rales.  Chest:     Chest wall: No tenderness.  Abdominal:     General: Bowel sounds are normal. There is no distension.     Palpations: Abdomen is soft. There is no mass.     Tenderness: There is no abdominal tenderness.  Musculoskeletal:        General: Normal range of motion.     Right lower leg: No edema.     Left lower leg: No edema.  Neurological:     Mental Status: He is alert and oriented to person, place, and time.  Psychiatric:        Mood and Affect: Mood normal.        Latest Ref Rng & Units 03/09/2022   12:33 PM 03/07/2022   11:45 AM 02/16/2022    3:44 PM  CMP  Glucose 70 - 99 mg/dL 201  151  110   BUN 8 - 23 mg/dL '25  19  22   '$ Creatinine 0.61 - 1.24 mg/dL 1.36  1.20  1.39   Sodium 135 - 145 mmol/L 136  135  142   Potassium 3.5 - 5.1 mmol/L 3.9  3.7  4.4   Chloride 98 - 111 mmol/L 104  101  96   CO2 22 - 32 mmol/L '26  25  26   '$ Calcium 8.9 - 10.3 mg/dL 9.3  8.7  9.4   Total Protein 6.5 - 8.1 g/dL 7.4     Total Bilirubin 0.3 - 1.2 mg/dL 0.4     Alkaline Phos 38 - 126 U/L 178     AST 15 - 41 U/L 41     ALT 0 - 44 U/L 51       Lipid  Panel     Component Value Date/Time   CHOL 129 03/07/2022 1145   CHOL 106 06/05/2020 0947   TRIG 85 03/07/2022 1145   HDL 47 03/07/2022 1145   HDL 53 06/05/2020 0947   CHOLHDL 2.7 03/07/2022 1145   VLDL 17 03/07/2022 1145   LDLCALC 65 03/07/2022 1145   LDLCALC 29 06/05/2020 0947    CBC    Component Value Date/Time   WBC 5.3 03/09/2022 1233   WBC 6.9 01/13/2022 0121   RBC 5.25 03/09/2022 1233   HGB 14.6 03/09/2022 1233   HGB 13.3 11/15/2019 1139   HCT 43.7 03/09/2022 1233   HCT 40.2 11/15/2019 1139   PLT 216 03/09/2022 1233   PLT 300 11/15/2019 1139   MCV 83.2 03/09/2022 1233   MCV 86 11/15/2019 1139   MCH 27.8 03/09/2022 1233   MCHC 33.4 03/09/2022 1233   RDW 15.7 (H) 03/09/2022 1233   RDW 14.5 11/15/2019 1139  LYMPHSABS 1.3 03/09/2022 1233   LYMPHSABS 3.3 (H) 09/28/2017 1113   MONOABS 0.5 03/09/2022 1233   EOSABS 0.2 03/09/2022 1233   EOSABS 0.3 09/28/2017 1113   BASOSABS 0.0 03/09/2022 1233   BASOSABS 0.0 09/28/2017 1113    Lab Results  Component Value Date   HGBA1C 7.9 (A) 04/25/2022    Assessment & Plan:  1. Type 2 diabetes mellitus with other specified complication, with long-term current use of insulin (HCC) A1c 7.9, goal is less than 7.0 He endorses sometimes forgetting his evening dose of insulin and we have discussed strategies to assist him in medication adherence including administering his insulin at the time he has dinner Counseled on Diabetic diet, my plate method, 161 minutes of moderate intensity exercise/week Blood sugar logs with fasting goals of 80-120 mg/dl, random of less than 180 and in the event of sugars less than 60 mg/dl or greater than 400 mg/dl encouraged to notify the clinic. Advised on the need for annual eye exams, annual foot exams, Pneumonia vaccine. - POCT glycosylated hemoglobin (Hb A1C)  2. Hypertension associated with diabetes ([place]) Controlled Continue antihypertensive  3. Hypertensive heart disease with chronic combined  systolic and diastolic congestive heart failure (HCC) EF of 20% He does have slight fluid overload and has been advised to increase Lasix from 40 mg daily to 80 mg daily and he will inform his cardiologist of this Will check renal function in 1 week given increase in Lasix dose Keep appointment for echocardiogram Continue Entresto He is on an SGLT2i Follow-up with cardiology at the [facility]; Future  4. Peripheral vascular disease (HCC) Stable Risk factor modification  5. Dyslipidemia associated with type 2 diabetes mellitus ([place]) Controlled Continue statin Low-cholesterol diet   No orders of the defined types were placed in this encounter.   Follow-up: Return in about 3 months (around 07/24/2022).       [name], MD, FAAFP. [facility] [place], [place]   04/25/2022, 12:41 PM

## 2022-04-25 NOTE — Patient Instructions (Signed)
Heart Failure Eating Plan Heart failure, also called congestive heart failure, occurs when your heart does not pump blood well enough to meet your body's needs for oxygen-rich blood. Heart failure is a long-term (chronic) condition. Living with heart failure can be challenging. Following your health care provider's instructions about a healthy lifestyle and working with a dietitian to choose the right foods may help to improve your symptoms. An eating plan for someone with heart failure will include changes that limit the intake of salt (sodium) and unhealthy fat. What are tips for following this plan? Reading food labels Check food labels for the amount of sodium per serving. Choose foods that have less than 140 mg (milligrams) of sodium in each serving. Check food labels for the number of calories per serving. This is important if you need to limit your daily calorie intake to lose weight. Check food labels for the serving size. If you eat more than one serving, you will be eating more sodium and calories than what is listed on the label. Look for foods that are labeled as "sodium-free," "very low sodium," or "low sodium." Foods labeled as "reduced sodium" or "lightly salted" may still have more sodium than what is recommended for you. Cooking Avoid adding salt when cooking. Ask your health care provider or dietitian before using salt substitutes. Season food with salt-free seasonings, spices, or herbs. Check the label of seasoning mixes to make sure they do not contain salt. Cook with heart-healthy oils, such as olive, canola, soybean, or sunflower oil. Do not fry foods. Cook foods using low-fat methods, such as baking, boiling, grilling, and broiling. Limit unhealthy fats when cooking by: Removing the skin from poultry, such as chicken. Removing all visible fats from meats. Skimming the fat off from stews, soups, and gravies before serving them. Meal planning  Limit your intake  of: Processed, canned, or prepackaged foods. Foods that are high in trans fat, such as fried foods. Sweets, desserts, sugary drinks, and other foods with added sugar. Full-fat dairy products, such as whole milk. Eat a balanced diet. This may include: 4-5 servings of fruit each day and 4-5 servings of vegetables each day. At each meal, try to fill one-half of your plate with fruits and vegetables. Up to 6-8 servings of whole grains each day. Up to 2 servings of lean meat, poultry, or fish each day. One serving of meat is equal to 3 oz (85 g). This is about the same size as a deck of cards. 2 servings of low-fat dairy each day. Heart-healthy fats. Healthy fats called omega-3 fatty acids are found in foods such as flaxseed and cold-water fish like sardines, salmon, and mackerel. Aim to eat 25-35 g (grams) of fiber a day. Foods that are high in fiber include apples, broccoli, carrots, beans, peas, and whole grains. Do not add salt or condiments that contain salt (such as soy sauce) to foods before eating. When eating at a restaurant, ask that your food be prepared with less salt or no salt, if possible. Try to eat 2 or more vegetarian meals each week. Eat more home-cooked food and eat less restaurant, buffet, and fast food. General information Do not eat more than 2,300 mg of sodium a day. The amount of sodium that is recommended for you may be lower, depending on your condition. Maintain a healthy body weight as directed. Ask your health care provider what a healthy weight is for you. Check your weight every day. Work with your health care provider   and dietitian to make a plan that is right for you to lose weight or maintain your current weight. Limit how much fluid you drink. Ask your health care provider or dietitian how much fluid you can have each day. Limit or avoid alcohol as told by your health care provider or dietitian. Recommended foods Fruits All fresh, frozen, and canned fruits.  Dried fruits, such as raisins, prunes, and cranberries. Vegetables All fresh vegetables. Vegetables that are frozen without sauce or added salt. Low-sodium or sodium-free canned vegetables. Grains Bread with less than 80 mg of sodium per slice. Whole-wheat pasta, quinoa, and brown rice. Oats and oatmeal. Barley. [place]. Grits and cream of wheat. Whole-grain and whole-wheat cold cereal. Meats and other protein foods Lean cuts of meat. Skinless chicken and [place]. Fish with high omega-3 fatty acids, such as salmon, sardines, and other cold-water fishes. Eggs. Dried beans, peas, and edamame. Unsalted nuts and nut butters. Dairy Low-fat or nonfat (skim) milk and dried milk. Rice milk, soy milk, and almond milk. Low-fat or nonfat yogurt. Small amounts of reduced-sodium block cheese. Low-sodium cottage cheese. Fats and oils Olive, canola, soybean, flaxseed, avocado, or sunflower oil. Sweets and desserts Applesauce. Granola bars. Sugar-free pudding and gelatin. Frozen fruit bars. Seasoning and other foods Fresh and dried herbs. Lemon or lime juice. Vinegar. Low-sodium ketchup. Salt-free marinades, salad dressings, sauces, and seasonings. The items listed above may not be a complete list of foods and beverages you can eat. Contact a dietitian for more information. Foods to avoid Fruits Fruits that are dried with sodium-containing preservatives. Vegetables Canned vegetables. Frozen vegetables with sauce or seasonings. Creamed vegetables. [place] fries. Onion rings. Pickled vegetables and sauerkraut. Grains Bread with more than 80 mg of sodium per slice. Hot or cold cereal with more than 140 mg sodium per serving. Salted pretzels and crackers. Prepackaged breadcrumbs. Bagels, croissants, and biscuits. Meats and other protein foods Ribs and chicken wings. Bacon, ham, pepperoni, bologna, salami, and packaged luncheon meats. Hot dogs, bratwurst, and sausage. Canned meat. Smoked meat and fish. Salted nuts  and seeds. Dairy Whole milk, half-and-half, and cream. Buttermilk. Processed cheese, cheese spreads, and cheese curds. Regular cottage cheese. Feta cheese. Shredded cheese. String cheese. Fats and oils Butter, lard, shortening, ghee, and bacon fat. Canned and packaged gravies. Seasoning and other foods Onion salt, garlic salt, table salt, and sea salt. Marinades. Regular salad dressings. Relishes, pickles, and olives. Meat flavorings and tenderizers, and bouillon cubes. Horseradish, ketchup, and mustard. Worcestershire sauce. Teriyaki sauce, soy sauce (including reduced sodium). Hot sauce and Tabasco sauce. Steak sauce, fish sauce, oyster sauce, and cocktail sauce. Taco seasonings. Barbecue sauce. Tartar sauce. The items listed above may not be a complete list of foods and beverages you should avoid. Contact a dietitian for more information. Summary A heart failure eating plan includes changes that limit your intake of sodium and unhealthy fat, and it may help you lose weight or maintain a healthy weight. Your health care provider may also recommend limiting how much fluid you drink. Most people with heart failure should eat no more than 2,300 mg of salt (sodium) a day. The amount of sodium that is recommended for you may be lower, depending on your condition. Contact your health care provider or dietitian before making any major changes to your diet. This information is not intended to replace advice given to you by your health care provider. Make sure you discuss any questions you have with your health care provider. Document Revised: 06/15/2021 Document Reviewed: 10/21/2019  Elsevier Patient Education  2023 [employer]  

## 2022-04-26 ENCOUNTER — Other Ambulatory Visit: Payer: Self-pay

## 2022-04-26 ENCOUNTER — Encounter: Payer: Self-pay | Admitting: Hematology and Oncology

## 2022-04-26 ENCOUNTER — Inpatient Hospital Stay: Payer: Medicare HMO | Attending: Hematology and Oncology | Admitting: Hematology and Oncology

## 2022-04-26 VITALS — BP 132/74 | HR 85 | Temp 97.4°F | Resp 18 | Ht 67.0 in | Wt 186.2 lb

## 2022-04-26 DIAGNOSIS — D472 Monoclonal gammopathy: Secondary | ICD-10-CM | POA: Insufficient documentation

## 2022-04-26 DIAGNOSIS — E8582 Wild-type transthyretin-related (ATTR) amyloidosis: Secondary | ICD-10-CM | POA: Insufficient documentation

## 2022-04-26 NOTE — Progress Notes (Signed)
[name]  Patient Care Team: [name], MD as PCP - General (Family Medicine) Lorretta Harp, MD as PCP - Cardiology (Cardiology) Rex Kras, Claudette Stapler, RN as [place] Management  ASSESSMENT & PLAN:  MGUS (monoclonal gammopathy of unknown significance) I reviewed multiple test results with the patient He has detectable MGUS but that is not the cause of his amyloidosis For this, I plan to see him once a year We discussed the natural history of MGUS This is not the cause of his abnormal kidney function or heart failure  Wild-type transthyretin-related (ATTR) amyloidosis ([place]) We discussed potential inheritable condition Recommend genetic counseling and referral and he is in agreement For amyloidosis of his heart, I have reached out to his cardiologist and he will prescribe appropriate treatment for the patient  Orders Placed This Encounter  Procedures   CMP ([facility] only)    Standing Status:   Future    Standing Expiration Date:   04/27/2023   CBC with Differential ([facility]cer Center Only)    Standing Status:   Future    Standing Expiration Date:   04/27/2023   Kappa/lambda light chains    Standing Status:   Standing    Number of Occurrences:   22    Standing Expiration Date:   04/27/2023   Multiple Myeloma Panel (SPEP&IFE w/QIG)    Standing Status:   Standing    Number of Occurrences:   22    Standing Expiration Date:   04/27/2023   Ambulatory referral to Genetics    Referral Priority:   Routine    Referral Type:   Consultation    Referral Reason:   Specialty Services Required    Requested Specialty:   Genetics    Number of Visits Requested:   1    All questions were answered. The patient knows to call the clinic with any problems, questions or concerns. The total time spent in the appointment was 20 minutes encounter with patients including review of chart and various tests results, discussions about plan of care  and coordination of care plan   Caleb Lark, MD 04/26/2022 3:41 PM  INTERVAL HISTORY: Please see below for problem oriented charting. he returns for review of test results His appointment was rescheduled multiple times We discussed test results and the patient is provided a copy of his test results and we discussed implication of his abnormal genetic testing for transthyretin  REVIEW OF SYSTEMS:   Constitutional: Denies fevers, chills or abnormal weight loss Eyes: Denies blurriness of vision Ears, nose, mouth, throat, and face: Denies mucositis or sore throat Respiratory: Denies cough, dyspnea or wheezes Cardiovascular: Denies palpitation, chest discomfort or lower extremity swelling Gastrointestinal:  Denies nausea, heartburn or change in bowel habits Skin: Denies abnormal skin rashes Lymphatics: Denies new lymphadenopathy or easy bruising Neurological:Denies numbness, tingling or new weaknesses Behavioral/Psych: Mood is stable, no new changes  All other systems were reviewed with the patient and are negative.  I have reviewed the past medical history, past surgical history, social history and family history with the patient and they are unchanged from previous note.  ALLERGIES:  is allergic to codeine and percocet [oxycodone-acetaminophen].  MEDICATIONS:  Current Outpatient Medications  Medication Sig Dispense Refill   amoxicillin (AMOXIL) 500 MG capsule Take 4 capsules (2,000 mg total) by mouth as directed. Take 4 tablets 1 hour prior to dental work, including cleanings. (Patient not taking: Reported on 04/25/2022) 12 capsule 12   aspirin  EC 81 MG tablet Take 81 mg by mouth in the morning.     atorvastatin (LIPITOR) 40 MG tablet Take 40 mg by mouth in the morning.     carvedilol (COREG) 12.5 MG tablet Take 1 tablet (12.5 mg total) by mouth 2 (two) times daily with a meal. 60 tablet 0   Empagliflozin-metFORMIN HCl ER (SYNJARDY XR) 25-1000 MG TB24 Take 1 tablet by mouth in the morning.      ferrous sulfate 325 (65 FE) MG tablet Take 325 mg by mouth in the morning.     furosemide (LASIX) 40 MG tablet Take 1 tablet (40 mg total) by mouth daily. 30 tablet 0   gabapentin (NEURONTIN) 800 MG tablet Take 800 mg by mouth at bedtime as needed (Sleep).     hydrALAZINE (APRESOLINE) 25 MG tablet Take 1 tablet (25 mg total) by mouth 3 (three) times daily. 90 tablet 3   insulin NPH-regular Human (70-30) 100 UNIT/ML injection Inject 8 Units into the skin daily with breakfast. 7 units with dinner     isosorbide mononitrate (IMDUR) 30 MG 24 hr tablet Take 1 tablet (30 mg total) by mouth daily. 30 tablet 3   PRESCRIPTION MEDICATION Inject 0.1-1 mLs as directed See admin instructions. Tri-Mix Standard Strength 5 ml. Formula: Prostaglandin 10 mcg/ml, Papaverine 30 mg/ml, Phentolamine 1 mg/ml. Inject 0.1 ml into side of penis as directed as needed (to be injected immediately before sexual intercourse) may increase the dose by 0.1 ml every 48 hours to achieve an erection. Max dose 1 ml.     sacubitril-valsartan (ENTRESTO) 49-51 MG Take 1 tablet by mouth 2 (two) times daily. 60 tablet 11   vitamin B-12 (CYANOCOBALAMIN) 500 MCG tablet Take 500 mcg by mouth in the morning.     Current Facility-Administered Medications  Medication Dose Route Frequency Provider Last Rate Last Admin   sodium chloride flush (NS) 0.9 % injection 3 mL  3 mL Intravenous Q12H Lorretta Harp, MD       sodium chloride flush (NS) 0.9 % injection 3 mL  3 mL Intravenous Q12H Lorretta Harp, MD        SUMMARY OF ONCOLOGIC HISTORY: This patient has significant coronary artery disease, valvular heart disease, peripheral vascular disease, insulin-dependent diabetes and chronic kidney disease He had extensive evaluation and had imaging study of his heart with MRI that disclosed the possibility of amyloidosis secondary to TTR, per report He has severe peripheral neuropathy affecting his hands and feet He has been diagnosed with  insulin-dependent diabetes for the past 15 years.  Prior to that, the patient has significant alcohol intake and with recurrent pancreatitis causing pancreatic damage He denies history of abnormal bone pain or bone fracture. Patient denies history of recurrent infection or atypical infections such as shingles of meningitis. Denies chills, night sweats, anorexia or abnormal weight loss. Despite his severe neuropathy, he is to exercise on a regular basis Further workup in December 2023 revealed MGUS as well as abnormal genetic testing with transthyretin related amyloidosis  PHYSICAL EXAMINATION: ECOG PERFORMANCE STATUS: 0 - Asymptomatic  Vitals:   04/26/22 1104  BP: 132/74  Pulse: 85  Resp: 18  Temp: (!) 97.4 F (36.3 C)  SpO2: 100%   Filed Weights   04/26/22 1104  Weight: 186 lb 3.2 oz (84.5 kg)    GENERAL:alert, no distress and comfortable  NEURO: alert & oriented x 3 with fluent speech, no focal motor/sensory deficits  LABORATORY DATA:  I have reviewed the data as  listed    Component Value Date/Time   NA 136 03/09/2022 1233   NA 142 02/16/2022 1544   K 3.9 03/09/2022 1233   CL 104 03/09/2022 1233   CO2 26 03/09/2022 1233   GLUCOSE 201 (H) 03/09/2022 1233   BUN 25 (H) 03/09/2022 1233   BUN 22 02/16/2022 1544   CREATININE 1.36 (H) 03/09/2022 1233   CREATININE 0.86 06/06/2016 1455   CALCIUM 9.3 03/09/2022 1233   PROT 7.4 03/09/2022 1233   PROT 7.8 01/19/2021 1022   ALBUMIN 3.7 03/09/2022 1233   ALBUMIN 3.9 01/19/2021 1022   AST 41 03/09/2022 1233   ALT 51 (H) 03/09/2022 1233   ALKPHOS 178 (H) 03/09/2022 1233   BILITOT 0.4 03/09/2022 1233   GFRNONAA 56 (L) 03/09/2022 1233   GFRNONAA 87 04/02/2013 1632   GFRAA >60 12/17/2019 0354   GFRAA >89 04/02/2013 1632    No results found for: "SPEP", "UPEP"  Lab Results  Component Value Date   WBC 5.3 03/09/2022   NEUTROABS 3.3 03/09/2022   HGB 14.6 03/09/2022   HCT 43.7 03/09/2022   MCV 83.2 03/09/2022   PLT 216  03/09/2022      Chemistry      Component Value Date/Time   NA 136 03/09/2022 1233   NA 142 02/16/2022 1544   K 3.9 03/09/2022 1233   CL 104 03/09/2022 1233   CO2 26 03/09/2022 1233   BUN 25 (H) 03/09/2022 1233   BUN 22 02/16/2022 1544   CREATININE 1.36 (H) 03/09/2022 1233   CREATININE 0.86 06/06/2016 1455      Component Value Date/Time   CALCIUM 9.3 03/09/2022 1233   ALKPHOS 178 (H) 03/09/2022 1233   AST 41 03/09/2022 1233   ALT 51 (H) 03/09/2022 1233   BILITOT 0.4 03/09/2022 1233

## 2022-04-26 NOTE — Assessment & Plan Note (Addendum)
I reviewed multiple test results with the patient He has detectable MGUS but that is not the cause of his amyloidosis For this, I plan to see him once a year We discussed the natural history of MGUS This is not the cause of his abnormal kidney function or heart failure

## 2022-04-26 NOTE — Assessment & Plan Note (Signed)
We discussed potential inheritable condition Recommend genetic counseling and referral and he is in agreement For amyloidosis of his heart, I have reached out to his cardiologist and he will prescribe appropriate treatment for the patient

## 2022-04-27 ENCOUNTER — Ambulatory Visit: Payer: Self-pay

## 2022-04-28 ENCOUNTER — Ambulatory Visit (INDEPENDENT_AMBULATORY_CARE_PROVIDER_SITE_OTHER): Payer: Medicare HMO | Admitting: Internal Medicine

## 2022-04-28 ENCOUNTER — Ambulatory Visit (INDEPENDENT_AMBULATORY_CARE_PROVIDER_SITE_OTHER): Payer: Medicare HMO | Admitting: Physician Assistant

## 2022-04-28 ENCOUNTER — Encounter: Payer: Self-pay | Admitting: Internal Medicine

## 2022-04-28 ENCOUNTER — Other Ambulatory Visit: Payer: Self-pay

## 2022-04-28 ENCOUNTER — Ambulatory Visit (HOSPITAL_COMMUNITY)
Admission: RE | Admit: 2022-04-28 | Discharge: 2022-04-28 | Disposition: A | Discharge status: Home / Self Care | Payer: Medicare HMO | Source: Ambulatory Visit | Attending: Vascular Surgery | Admitting: Vascular Surgery

## 2022-04-28 VITALS — BP 124/72 | HR 76 | Ht 67.0 in | Wt 182.0 lb

## 2022-04-28 VITALS — BP 123/77 | HR 69 | Temp 97.7°F | Ht 67.0 in | Wt 180.0 lb

## 2022-04-28 DIAGNOSIS — I6523 Occlusion and stenosis of bilateral carotid arteries: Secondary | ICD-10-CM

## 2022-04-28 DIAGNOSIS — E1169 Type 2 diabetes mellitus with other specified complication: Secondary | ICD-10-CM | POA: Diagnosis not present

## 2022-04-28 DIAGNOSIS — E1159 Type 2 diabetes mellitus with other circulatory complications: Secondary | ICD-10-CM

## 2022-04-28 DIAGNOSIS — E1165 Type 2 diabetes mellitus with hyperglycemia: Secondary | ICD-10-CM | POA: Diagnosis not present

## 2022-04-28 DIAGNOSIS — Z794 Long term (current) use of insulin: Secondary | ICD-10-CM | POA: Diagnosis not present

## 2022-04-28 NOTE — Patient Instructions (Signed)
Visit Information  Thank you for taking time to visit with me today. Please don't hesitate to contact me if I can be of assistance to you.   Following are the goals we discussed today:   Goals Addressed               This Visit's Progress     Patient Stated     To be active again without shortness of breath (pt-stated)        Care Coordination Interventions: Determined patient completed his f/u with PCP since last RN CC call  Review of patient status, including review of consultant's reports, relevant laboratory and other test results, and medications completed Re-education patient about placing scale on hard, flat surface, weigh each morning after emptying bladder Re-educated patient on importance of daily weight and advised patient to weigh and record daily Determined patient's daughter communicated with Dr. Gwenlyn Found regarding cardiac rehab and Dr. Gwenlyn Found approved Educated patient about Cardiac Rehab, referral process and what to expect Placed outbound call to Dr. Kennon Holter office to confirm a referral has been placed for Cardiac Rehab          To lower A1c <7.0% (pt-stated)        Care Coordination Interventions: Provided education to patient about basic DM disease process Reviewed medications with patient and discussed importance of medication adherence Counseled on importance of regular laboratory monitoring as prescribed Provided patient with written educational materials related to hypo and hyperglycemia and importance of correct treatment Advised patient, providing education and rationale, to check cbg daily before meals and at bedtime and record, calling PCP for findings outside established parameters Review of patient status, including review of consultants reports, relevant laboratory and other test results, and medications completed Counseled on Diabetic diet, my plate method, 903 minutes of moderate intensity exercise/week Mailed printed educational materials related to  Diabetes Management           Our next appointment is by telephone on 05/11/22 at 11:30 AM  Please call the care guide team at [phone] if you need to cancel or reschedule your appointment.   If you are experiencing a Mental Health or [place] or need someone to talk to, please call 1-800-273-TALK (toll free, 24 hour hotline) go to [facility] [address] [place] [phone])  Patient verbalizes understanding of instructions and care plan provided today and agrees to view in [place]. Active MyChart status and patient understanding of how to access instructions and care plan via MyChart confirmed with patient.     Barb Merino, RN, BSN, CCM Care Management Coordinator [facility] Care Management Direct Phone: [phone]

## 2022-04-28 NOTE — Patient Outreach (Signed)
  Care Coordination   Follow Up Visit Note   04/28/2022 Name: Caleb [name] MRN: [id number] DOB: February 04, 1951  Caleb [name] is a 72 y.o. year old male who sees [name], MD for primary care. I spoke with  Caleb [name] by phone today.  What matters to the patients health and wellness today?  Patient would like to start Cardiac Rehab.     Goals Addressed               This Visit's Progress     Patient Stated     To be active again without shortness of breath (pt-stated)        Care Coordination Interventions: Determined patient completed his f/u with PCP since last RN CC call  Review of patient status, including review of consultant's reports, relevant laboratory and other test results, and medications completed Re-education patient about placing scale on hard, flat surface, weigh each morning after emptying bladder Re-educated patient on importance of daily weight and advised patient to weigh and record daily Determined patient's daughter communicated with Dr. Gwenlyn Found regarding cardiac rehab and Dr. Gwenlyn Found approved Educated patient about Cardiac Rehab, referral process and what to expect Placed outbound call to Dr. Kennon Holter office to confirm a referral has been placed for Cardiac Rehab          To lower A1c <7.0% (pt-stated)        Care Coordination Interventions: Provided education to patient about basic DM disease process Reviewed medications with patient and discussed importance of medication adherence Counseled on importance of regular laboratory monitoring as prescribed Provided patient with written educational materials related to hypo and hyperglycemia and importance of correct treatment Advised patient, providing education and rationale, to check cbg daily before meals and at bedtime and record, calling PCP for findings outside established parameters Review of patient status, including review of consultants reports, relevant laboratory and other test results, and  medications completed Counseled on Diabetic diet, my plate method, 539 minutes of moderate intensity exercise/week Mailed printed educational materials related to Diabetes Management           SDOH assessments and interventions completed:  No     Care Coordination Interventions:  Yes, provided   Follow up plan: Follow up call scheduled for 05/11/22 '@11'$ :30 AM     Encounter Outcome:  Pt. Visit Completed

## 2022-04-28 NOTE — Addendum Note (Signed)
Addended by: Betha Loa F on: 04/28/2022 09:10 AM   Modules accepted: Orders

## 2022-04-28 NOTE — Patient Instructions (Signed)
-   Continue Synjardy 25-1000 mg , 1 tablet daily  - Change  Novolin Mix 8 units before breakfast and Supper     HOW TO TREAT LOW BLOOD SUGARS (Blood sugar LESS THAN 70 MG/DL) Please follow the RULE OF 15 for the treatment of hypoglycemia treatment (when your (blood sugars are less than 70 mg/dL)   STEP 1: Take 15 grams of carbohydrates when your blood sugar is low, which includes:  3-4 GLUCOSE TABS  OR 3-4 OZ OF JUICE OR REGULAR SODA OR ONE TUBE OF GLUCOSE GEL    STEP 2: RECHECK blood sugar in 15 MINUTES STEP 3: If your blood sugar is still low at the 15 minute recheck --> then, go back to STEP 1 and treat AGAIN with another 15 grams of carbohydrates.

## 2022-04-28 NOTE — Telephone Encounter (Signed)
Spoke with patient who wanted cardiac rehab orders placed. (Done)

## 2022-04-28 NOTE — Progress Notes (Signed)
Established Carotid Patient   History of Present Illness   Caleb [name] is a 72 y.o. (07/23/1950) male who presents for surveillance of bilateral carotid stenosis.  He has a history of left TCAR on 12/16/2019 by Dr. Oneida Alar and subsequent right TCAR on 02/03/2020 by Dr. Oneida Alar.  This was done to treat asymptomatic, high-grade lesions that required treatment due to high risk aortic stenosis with decreased EF.  He also has a history of PAD that is followed by Dr. Gwenlyn Found.  He was last seen by our office in October 2022 and was doing well.  He did not have any strokelike symptoms.  He also denied any claudication or rest pain.  Recently in October 2023, the patient underwent transfemoral aortic valve replacement for severe aortic stenosis.  His procedure went well and he is due for follow-up with cardiology soon.  His only issue as of recent is intermittent shortness of breath when waking up.  He does not get short of breath when exercising.  He recently was increased from Lasix 40 mg to 80 mg.  He states that this is helped with his shortness of breath.  He plans to mention this issue with his cardiologist again.  He states he is working out 3-4 times weekly and will soon be starting cardiac rehab.  He denies any strokelike symptoms.  He denies any claudication or rest pain.  Current Outpatient Medications  Medication Sig Dispense Refill   aspirin EC 81 MG tablet Take 81 mg by mouth in the morning.     atorvastatin (LIPITOR) 40 MG tablet Take 40 mg by mouth in the morning.     carvedilol (COREG) 12.5 MG tablet Take 1 tablet (12.5 mg total) by mouth 2 (two) times daily with a meal. 60 tablet 0   Empagliflozin-metFORMIN HCl ER (SYNJARDY XR) 25-1000 MG TB24 Take 1 tablet by mouth in the morning.     ferrous sulfate 325 (65 FE) MG tablet Take 325 mg by mouth in the morning.     furosemide (LASIX) 40 MG tablet Take 1 tablet (40 mg total) by mouth daily. (Patient taking differently: Take 80 mg by  mouth daily.) 30 tablet 0   gabapentin (NEURONTIN) 800 MG tablet Take 800 mg by mouth at bedtime as needed (Sleep).     hydrALAZINE (APRESOLINE) 25 MG tablet Take 1 tablet (25 mg total) by mouth 3 (three) times daily. 90 tablet 3   insulin NPH-regular Human (70-30) 100 UNIT/ML injection Inject 8 Units into the skin daily with breakfast. 7 units with dinner     isosorbide mononitrate (IMDUR) 30 MG 24 hr tablet Take 1 tablet (30 mg total) by mouth daily. 30 tablet 3   PRESCRIPTION MEDICATION Inject 0.1-1 mLs as directed See admin instructions. Tri-Mix Standard Strength 5 ml. Formula: Prostaglandin 10 mcg/ml, Papaverine 30 mg/ml, Phentolamine 1 mg/ml. Inject 0.1 ml into side of penis as directed as needed (to be injected immediately before sexual intercourse) may increase the dose by 0.1 ml every 48 hours to achieve an erection. Max dose 1 ml.     sacubitril-valsartan (ENTRESTO) 49-51 MG Take 1 tablet by mouth 2 (two) times daily. 60 tablet 11   vitamin B-12 (CYANOCOBALAMIN) 500 MCG tablet Take 500 mcg by mouth in the morning.     Current Facility-Administered Medications  Medication Dose Route Frequency Provider Last Rate Last Admin   sodium chloride flush (NS) 0.9 % injection 3 mL  3 mL Intravenous Q12H Lorretta Harp, MD  sodium chloride flush (NS) 0.9 % injection 3 mL  3 mL Intravenous Q12H Lorretta Harp, MD        REVIEW OF SYSTEMS (negative unless checked):   Cardiac:  '[]'$  Chest pain or chest pressure? '[]'$  Shortness of breath upon activity? '[x]'$  Shortness of breath when lying flat? '[]'$  Irregular heart rhythm?  Vascular:  '[]'$  Pain in calf, thigh, or hip brought on by walking? '[]'$  Pain in feet at night that wakes you up from your sleep? '[]'$  Blood clot in your veins? '[]'$  Leg swelling?  Pulmonary:  '[]'$  Oxygen at home? '[]'$  Productive cough? '[]'$  Wheezing?  Neurologic:  '[]'$  Sudden weakness in arms or legs? '[]'$  Sudden numbness in arms or legs? '[]'$  Sudden onset of difficult speaking or  slurred speech? '[]'$  Temporary loss of vision in one eye? '[]'$  Problems with dizziness?  Gastrointestinal:  '[]'$  Blood in stool? '[]'$  Vomited blood?  Genitourinary:  '[]'$  Burning when urinating? '[]'$  Blood in urine?  Psychiatric:  '[]'$  Major depression  Hematologic:  '[]'$  Bleeding problems? '[]'$  Problems with blood clotting?  Dermatologic:  '[]'$  Rashes or ulcers?  Constitutional:  '[]'$  Fever or chills?  Ear/Nose/Throat:  '[]'$  Change in hearing? '[]'$  Nose bleeds? '[]'$  Sore throat?  Musculoskeletal:  '[]'$  Back pain? '[]'$  Joint pain? '[]'$  Muscle pain?   Physical Examination   Vitals:   04/28/22 1351 04/28/22 1352  BP: 110/77 123/77  Pulse: 69   Temp: 97.7 F (36.5 C)   TempSrc: Temporal   SpO2: 98%   Weight: 180 lb (81.6 kg)   Height: '5\' 7"'$  (1.702 m)    Body mass index is 28.19 kg/m.  General:  WDWN in NAD; vital signs documented above Gait: Not observed HENT: WNL, normocephalic Pulmonary: normal non-labored breathing Cardiac: regular rate and rhythm, no carotid bruit Abdomen: soft, NT, no masses Skin: without rashes Vascular Exam/Pulses: Palpable radial pulses bilaterally Extremities: Without ischemic changes, gangrene, edema Musculoskeletal: no muscle wasting or atrophy  Neurologic: A&O X 3;  No focal weakness or paresthesias are detected Psychiatric:  The pt has Normal affect.  Non-Invasive Vascular Imaging   B Carotid Duplex (04/28/2022):  Right Carotid Findings:  +----------+--------+--------+--------+------------------+--------+           PSV cm/sEDV cm/sStenosisPlaque DescriptionComments  +----------+--------+--------+--------+------------------+--------+  CCA Prox  57      10                                          +----------+--------+--------+--------+------------------+--------+  CCA Distal86      17                                          +----------+--------+--------+--------+------------------+--------+  ECA      93                                                   +----------+--------+--------+--------+------------------+--------+   +----------+--------+-------+----------------+-------------------+           PSV cm/sEDV cmsDescribe        Arm Pressure (mmHG)  +----------+--------+-------+----------------+-------------------+  [id number]            Multiphasic, [id number]                  +----------+--------+-------+----------------+-------------------+   +---------+--------+--+--------+-+---------+  VertebralPSV cm/s23EDV cm/s9Antegrade  +---------+--------+--+--------+-+---------+      Right Stent(s):  +---------------+--------+--------+--------+--------+--------+  ICA           PSV cm/sEDV cm/sStenosisWaveformComments  +---------------+--------+--------+--------+--------+--------+  Prox to Stent  86      17                                +---------------+--------+--------+--------+--------+--------+  Proximal Stent 88      14                                +---------------+--------+--------+--------+--------+--------+  Mid Stent      83      29                                +---------------+--------+--------+--------+--------+--------+  Distal Stent   67      18                                +---------------+--------+--------+--------+--------+--------+  Distal to Stent39      14                                +---------------+--------+--------+--------+--------+--------+          Left Carotid Findings:  +----------+--------+--------+--------+------------------+-----------------  -+           PSV cm/sEDV cm/sStenosisPlaque DescriptionComments             +----------+--------+--------+--------+------------------+-----------------  -+  CCA Prox  64      7                                                      +----------+--------+--------+--------+------------------+-----------------  -+  CCA Distal38      10                                 intimal  thickening  +----------+--------+--------+--------+------------------+-----------------  -+  ECA      252             >50%                                           +----------+--------+--------+--------+------------------+-----------------  -+   +----------+--------+--------+----------------+-------------------+           PSV cm/sEDV cm/sDescribe        Arm Pressure (mmHG)  +----------+--------+--------+----------------+-------------------+  [id number]             Multiphasic, [id number]                  +----------+--------+--------+----------------+-------------------+   +---------+--------+--+--------+--+---------+  VertebralPSV cm/s38EDV cm/s11Antegrade  +---------+--------+--+--------+--+---------+      Left Stent(s):  +---------------+--------+--------+-------------+--------+--------+  ICA           PSV cm/sEDV cm/sStenosis     WaveformComments  +---------------+--------+--------+-------------+--------+--------+  Prox to Stent  38      10                                     +---------------+--------+--------+-------------+--------+--------+  Proximal Stent 331     149     >75% stenosis                  +---------------+--------+--------+-------------+--------+--------+  Mid Stent      238     76                                     +---------------+--------+--------+-------------+--------+--------+  Distal Stent   80      20                                     +---------------+--------+--------+-------------+--------+--------+  Distal to Stent35      17                                     +---------------+--------+--------+-------------+--------+--------+    Medical Decision Making   BOND GRIESHOP is a 72 y.o. male who presents for surveillance of bilateral carotid artery stenosis  Based on the patient's vascular studies, his right ICA stent is patent with no signs of stenosis.   His left ICA stent is patent with greater than 75% stenosis in the proximal stent.  PSV is 331 cm/s and EDV is 149 cm/s.  This area of stenosis is new.  Previous duplex in 2022 demonstrated no stenosis in the left ICA stent. He denies any strokelike symptoms.  He continues to exercise and take his aspirin and statin daily.  He is getting stronger after his aortic valve replacement in October 2023.  He is about to start cardiac rehab. I have discussed the case with Dr. [name], and he would like the patient to get a CTA head/neck and follow-up to discuss results within the next 2 weeks.  The patient is aware that this area of stenosis may require intervention and he is agreeable.   Vicente Serene PA-C [facility] Office: [phone]  Call MD: [name]

## 2022-04-28 NOTE — Progress Notes (Signed)
Name: Caleb [name]  MRN/ DOB: [id number], [birth year]   Age/ Sex: 72 y.o., male    PCP: [name], MD   Reason for Endocrinology Evaluation: Type 2 Diabetes Mellitus     Date of Initial Endocrinology Visit: 06/30/2021    PATIENT IDENTIFIER: Caleb [name] is a 72 y.o. male with a past medical history of HTN, PVD, T2DM, carotid artery disease,Hx acute pancreatitis (2014) and dyslipidemia. The patient presented for initial endocrinology clinic visit on 06/30/2021 for consultative assistance with his diabetes management.    HPI: Caleb [name] was    Diagnosed with DM years ago Prior Medications tried/Intolerance: has been on insulin for a while  Hemoglobin A1c has ranged from 7.6% in 2020, peaking at 8.2% in 2022.  Follows with the [facility]   On his initial visit I have attempted to switch Synjardy to    SUBJECTIVE:   During the last visit (09/29/2021): A1c 8.0%   Today (04/28/22): Caleb [name] is here for a follow up on diabetes management. He checks his blood sugars multiple times daily through freestyle libre . He did not bring his meter today    Since his last visit here, the patient has been diagnosed with monoclonal gammopathy of unknown significance, he has been following up with oncology He also has been noted with amyloidosis , genetic testing has been recommended 04/26/2022 He was seen by cardiology, Lasix dose increased He was seen at the [facility] for nail dystrophy 04/18/2022  He has occasional SOB , sporadic in nature , increasing lasix has improved   I have attempted to change Synjardy to max doses but he is still on the 25-1000 daily dose    He holds insulin if BG is normal , for example this M BG was 117 mg/dL   HOME DIABETES REGIMEN: Synjardy 12.07-998 mg , two tabs daily - he takes 25-1000 mg daily  NovoLin  mix 6  units BID - He take 8 units BID     Statin: Yes ACE-I/ARB: No     CONTINUOUS GLUCOSE MONITORING RECORD INTERPRETATION    Dates  of Recording: 6/29-7/02/2022  Sensor description:freestyle libre  Results statistics:   CGM use % of time 94  Average and SD 148/24.8  Time in range     81 %  % Time Above 180 18  % Time above 250 1  % Time Below target 0    Glycemic patterns summary: hyperglycemia noted postprandial, BG's optimal at night and between meals   Hyperglycemic episodes  postprandial  Hypoglycemic episodes occurred n/a  Overnight periods: optimal     DIABETIC COMPLICATIONS: Microvascular complications:  Peripheral neuropathy Denies: CKD Last eye exam: Completed 05/2021  Macrovascular complications:  PVD Denies: CAD, CVA   PAST HISTORY: Past Medical History:  Past Medical History:  Diagnosis Date   Carotid artery occlusion    Diabetes mellitus without complication ([place])    Type II   Dyslipidemia 09/27/2012   Erectile dysfunction 09/27/2012   GERD (gastroesophageal reflux disease)    Hypercholesteremia    Hyperlipidemia 08/12/2012   Hypertension    Hyponatremia 08/14/2012   Neuropathy    Pancreatitis    Pancreatitis, acute 09/27/2012   Peripheral vascular disease ([place])    S/P 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 Burt Knack & Bartle   Severe aortic stenosis    Vitamin D deficiency 06/23/2015   Past Surgical History:  Past Surgical History:  Procedure Laterality Date   ABDOMINAL AORTOGRAM W/LOWER EXTREMITY N/A 08/08/2019   Procedure: ABDOMINAL AORTOGRAM W/LOWER EXTREMITY;  Surgeon: Lorretta Harp, MD;  Location: [place] [facility];  Service: Cardiovascular;  Laterality: N/A;   ABDOMINAL AORTOGRAM W/LOWER EXTREMITY N/A 11/18/2019   Procedure: ABDOMINAL AORTOGRAM W/LOWER EXTREMITY;  Surgeon: Lorretta Harp, MD;  Location: [place] [facility];  Service: Cardiovascular;  Laterality: N/A;   CARDIAC CATHETERIZATION     COLONOSCOPY  12 years ago   in [place] clinic= normal exam per pt   INTRAOPERATIVE  TRANSTHORACIC ECHOCARDIOGRAM N/A 01/11/2022   Procedure: INTRAOPERATIVE TRANSTHORACIC ECHOCARDIOGRAM;  Surgeon: Sherren Mocha, MD;  Location: [place] [facility];  Service: Open Heart Surgery;  Laterality: N/A;   KNEE SURGERY     MULTIPLE EXTRACTIONS WITH ALVEOLOPLASTY N/A 12/09/2021   Procedure: MULTIPLE EXTRACTION;  Surgeon: Charlaine Dalton, DMD;  Location: [facility];  Service: Dentistry;  Laterality: N/A;   PERIPHERAL VASCULAR INTERVENTION Right 08/08/2019   Procedure: PERIPHERAL VASCULAR INTERVENTION;  Surgeon: Lorretta Harp, MD;  Location: [facility] [facility];  Service: Cardiovascular;  Laterality: Right;   PERIPHERAL VASCULAR INTERVENTION Left 11/18/2019   Procedure: PERIPHERAL VASCULAR INTERVENTION;  Surgeon: Lorretta Harp, MD;  Location: [place] [facility];  Service: Cardiovascular;  Laterality: Left;  left popliteal artery   RIGHT/LEFT HEART CATH AND CORONARY ANGIOGRAPHY N/A 10/29/2021   Procedure: RIGHT/LEFT HEART CATH AND CORONARY ANGIOGRAPHY;  Surgeon: Burnell Blanks, MD;  Location: [place] [facility];  Service: Cardiovascular;  Laterality: N/A;   TRANSCAROTID ARTERY REVASCULARIZATION  Left 12/16/2019   Procedure: TRANSCAROTID ARTERY REVASCULARIZATION;  Surgeon: Elam Dutch, MD;  Location: [facility];  Service: Vascular;  Laterality: Left;   TRANSCAROTID ARTERY REVASCULARIZATION  Right 02/03/2020   Procedure: RIGHT TRANSCAROTID ARTERY REVASCULARIZATION;  Surgeon: Elam Dutch, MD;  Location: [place];  Service: Vascular;  Laterality: Right;   TRANSCATHETER AORTIC VALVE REPLACEMENT, TRANSFEMORAL N/A 01/11/2022   Procedure: Transcatheter Aortic Valve Replacement, Transfemoral;  Surgeon: Sherren Mocha, MD;  Location: [place] [facility];  Service: Open Heart Surgery;  Laterality: N/A;   ULTRASOUND GUIDANCE FOR VASCULAR ACCESS Right 12/16/2019   Procedure: ULTRASOUND GUIDANCE FOR VASCULAR ACCESS;  Surgeon: Elam Dutch, MD;  Location: [facility];  Service: Vascular;   Laterality: Right;   ULTRASOUND GUIDANCE FOR VASCULAR ACCESS Right 02/03/2020   Procedure: ULTRASOUND GUIDANCE FOR VASCULAR ACCESS;  Surgeon: Elam Dutch, MD;  Location: [facility];  Service: Vascular;  Laterality: Right;   VASECTOMY      Social History:  reports that he has never smoked. He has never used smokeless tobacco. He reports current alcohol use. He reports that he does not use drugs. Family History:  Family History  Problem Relation Age of Onset   CAD Father    Hypertension Father    Alcohol abuse Father        Cause of death   Diabetes Mother    Colon polyps Mother    CAD Brother 84       CABG   Colon cancer Neg Hx    Esophageal cancer Neg Hx    Rectal cancer Neg Hx    Stomach cancer Neg Hx      HOME MEDICATIONS: Allergies as of 04/28/2022       Reactions   Codeine Nausea And Vomiting   Percocet [oxycodone-acetaminophen] Nausea And Vomiting        Medication List        Accurate  as of April 28, 2022  8:26 AM. If you have any questions, ask your nurse or doctor.          STOP taking these medications    amoxicillin 500 MG capsule Commonly known as: AMOXIL Stopped by: Dorita Sciara, MD       TAKE these medications    aspirin EC 81 MG tablet Take 81 mg by mouth in the morning.   atorvastatin 40 MG tablet Commonly known as: LIPITOR Take 40 mg by mouth in the morning.   carvedilol 12.5 MG tablet Commonly known as: COREG Take 1 tablet (12.5 mg total) by mouth 2 (two) times daily with a meal.   cyanocobalamin 500 MCG tablet Commonly known as: VITAMIN B12 Take 500 mcg by mouth in the morning.   Entresto 49-51 MG Generic drug: sacubitril-valsartan Take 1 tablet by mouth 2 (two) times daily.   ferrous sulfate 325 (65 FE) MG tablet Take 325 mg by mouth in the morning.   furosemide 40 MG tablet Commonly known as: LASIX Take 1 tablet (40 mg total) by mouth daily. What changed: how much to take   gabapentin 800 MG  tablet Commonly known as: NEURONTIN Take 800 mg by mouth at bedtime as needed (Sleep).   hydrALAZINE 25 MG tablet Commonly known as: APRESOLINE Take 1 tablet (25 mg total) by mouth 3 (three) times daily.   insulin NPH-regular Human (70-30) 100 UNIT/ML injection Inject 8 Units into the skin daily with breakfast. 7 units with dinner   isosorbide mononitrate 30 MG 24 hr tablet Commonly known as: IMDUR Take 1 tablet (30 mg total) by mouth daily.   PRESCRIPTION MEDICATION Inject 0.1-1 mLs as directed See admin instructions. Tri-Mix Standard Strength 5 ml. Formula: Prostaglandin 10 mcg/ml, Papaverine 30 mg/ml, Phentolamine 1 mg/ml. Inject 0.1 ml into side of penis as directed as needed (to be injected immediately before sexual intercourse) may increase the dose by 0.1 ml every 48 hours to achieve an erection. Max dose 1 ml.   Synjardy XR 25-1000 MG Tb24 Generic drug: Empagliflozin-metFORMIN HCl ER Take 1 tablet by mouth in the morning.         ALLERGIES: Allergies  Allergen Reactions   Codeine Nausea And Vomiting   Percocet [Oxycodone-Acetaminophen] Nausea And Vomiting        OBJECTIVE:   VITAL SIGNS: BP 124/72 (BP Location: Left Arm, Patient Position: Sitting, Cuff Size: Small)   Pulse 76   Ht '5\' 7"'$  (1.702 m)   Wt 182 lb (82.6 kg)   SpO2 98%   BMI 28.51 kg/m    PHYSICAL EXAM:  General: Pt appears well and is in NAD  Neck: General: Supple without adenopathy or carotid bruits. Thyroid: Thyroid size normal.  No goiter or nodules appreciated.   Lungs: Clear with good BS bilat with no rales, rhonchi, or wheezes  Heart: RRR, + systolic murmur   Abdomen: Normoactive bowel sounds, soft, nontender, without masses or organomegaly palpable  Extremities:  Lower extremities - No pretibial edema. No lesions.  Neuro: MS is good with appropriate affect, pt is alert and Ox3    DM foot exam: Per podiatry 04/12/2022  The skin of the feet is without sores or ulcerations. Pt with  left 4th and 5th toe amputations  The pedal pulses are undetectable  The sensation is decreased to a screening 5.07, 10 gram monofilament bilaterally   DATA REVIEWED:  Lab Results  Component Value Date   HGBA1C 7.9 (A) 04/25/2022   HGBA1C 8.2 (  H) 01/07/2022   HGBA1C 8.0 (A) 09/29/2021    04/12/2022 TG 91 HDL 47 LDL 35 A1c 8.2% BUN 30 Calcium 8.4 GFR 46  DXA 04/12/2022 normal T-score -1.0 at the left femoral neck  ASSESSMENT / PLAN / RECOMMENDATIONS:   1) Type 2 Diabetes Mellitus, Poorly controlled, With CKD  III and macrovascular complications - Most recent A1c of 7.9%. Goal A1c <7.0%.     - A1c remains stable but above goal  - He has been holding his insulin if his pre-meal BG is " normal range" and would take it after the meal or bedtime  - I emphasized the importance of taking his insulin BEFORE meals and that taking it at bedtime would increase his risk of hypoglycemia  - I have attempted to switch Synjardy to 12.07-998 BID but he gets this through the [place] and continue to be on 25-1000 mg daily  - Pt with Hx of Pancreatitis, he is not a candidate DPP-4 inhibitors, GLP-1 agonists and Tirzeptide - NO changes at this time, due to lack of glucose data, no CGM data today    MEDICATIONS:  Continue  Synjardy 25-1000 mg ,  daily  Continue  Novolin Mix 8 units BID   EDUCATION / INSTRUCTIONS: BG monitoring instructions: Patient is instructed to check his blood sugars 3 times a day, before meals . Call [facility] [facility] if: BG persistently < 70  I reviewed the Rule of 15 for the treatment of hypoglycemia in detail with the patient. Literature supplied.   2) Diabetic complications:  Eye: Does not have known diabetic retinopathy.  Neuro/ Feet: Does  have known diabetic peripheral neuropathy. Renal: Patient does not have known baseline CKD. He is not on an ACEI/ARB at present.   F/U in 6 months     Signed electronically by: Mack Guise,  MD  [facility]  [facility] [address] [place], [place] [address] Phone: [phone] FAX: [phone]   CC: [name], [place] [address] Phone: [phone]  Fax: [phone]    Return to Endocrinology clinic as below: Future Appointments  Date Time Provider [place]  04/28/2022  1:30 PM [facility] HS VASC 2 - [facility]-H[facility]I VVS  04/28/2022  2:30 PM [facility] PA [facility] VVS  05/02/2022 10:30 AM [facility]WW COVERING PROVIDER 2 CHW-CHWW None  05/30/2022 10:00 AM MC-CV NL VASC 4 MC-SECVI CH[facility]NL  06/03/2022 11:20 AM Sabharwal, Aditya, DO MC-HVSC None  07/25/2022 11:10 AM [name], MD CHW-CHWW None  01/11/2023  1:50 PM MC-CV CH ECHO 2 [facility]  01/11/2023  2:45 PM CVD-[facility]CH STRUCTURAL HEART APP [facility]CH[facility]  05/01/2023  2:00 PM [facility]-MED-ONC LAB [facility]-MEDONC None  05/11/2023 12:00 PM Heath Lark, MD [facility]-MED[facility]C None

## 2022-05-02 ENCOUNTER — Ambulatory Visit: Payer: Medicare HMO | Attending: Family Medicine

## 2022-05-02 DIAGNOSIS — Z0000 Encounter for general adult medical examination without abnormal findings: Secondary | ICD-10-CM

## 2022-05-02 NOTE — Patient Instructions (Signed)
[name] , Thank you for taking time to come for your Medicare Wellness Visit. I appreciate your ongoing commitment to your health goals. Please review the following plan we discussed and let me know if I can assist you in the future.   These are the goals we discussed:  Goals       Patient Stated     To be active again without shortness of breath (pt-stated)      Care Coordination Interventions: Determined patient completed his f/u with PCP since last RN CC call  Review of patient status, including review of consultant's reports, relevant laboratory and other test results, and medications completed Re-education patient about placing scale on hard, flat surface, weigh each morning after emptying bladder Re-educated patient on importance of daily weight and advised patient to weigh and record daily Determined patient's daughter communicated with Dr. Gwenlyn Found regarding cardiac rehab and Dr. Gwenlyn Found approved Educated patient about Cardiac Rehab, referral process and what to expect Placed outbound call to Dr. Kennon Holter office to confirm a referral has been placed for Cardiac Rehab          To lower A1c <7.0% (pt-stated)      Care Coordination Interventions: Provided education to patient about basic DM disease process Reviewed medications with patient and discussed importance of medication adherence Counseled on importance of regular laboratory monitoring as prescribed Provided patient with written educational materials related to hypo and hyperglycemia and importance of correct treatment Advised patient, providing education and rationale, to check cbg daily before meals and at bedtime and record, calling PCP for findings outside established parameters Review of patient status, including review of consultants reports, relevant laboratory and other test results, and medications completed Counseled on Diabetic diet, my plate method, [id number] minutes of moderate intensity exercise/week Mailed printed  educational materials related to Diabetes Management         Other     Blood Pressure < 140/90      HEMOGLOBIN A1C < 7.0        This is a list of the screening recommended for you and due dates:  Health Maintenance  Topic Date Due   Flu Shot  06/19/2022*   Zoster (Shingles) Vaccine (1 of 2) 07/31/2022*   COVID-19 Vaccine (7 - 2023-24 season) 06/07/2022   Yearly kidney health urinalysis for diabetes  07/21/2022   Hemoglobin A1C  10/24/2022   Eye exam for diabetics  12/15/2022   Yearly kidney function blood test for diabetes  03/10/2023   Complete foot exam   04/29/2023   Medicare Annual Wellness Visit  05/03/2023   Colon Cancer Screening  02/19/2026   DTaP/Tdap/Td vaccine (3 - Td or Tdap) 06/07/2026   Pneumonia Vaccine  Completed   Hepatitis C Screening: USPSTF Recommendation to screen - Ages 30-79 yo.  Completed   HPV Vaccine  Aged Out  *Topic was postponed. The date shown is not the original due date.   Health Maintenance After Age 32 After age 57, you are at a higher risk for certain long-term diseases and infections as well as injuries from falls. Falls are a major cause of broken bones and head injuries in people who are older than age 22. Getting regular preventive care can help to keep you healthy and well. Preventive care includes getting regular testing and making lifestyle changes as recommended by your health care provider. Talk with your health care provider about: Which screenings and tests you should have. A screening is a test that checks for a  disease when you have no symptoms. A diet and exercise plan that is right for you. What should I know about screenings and tests to prevent falls? Screening and testing are the best ways to find a health problem early. Early diagnosis and treatment give you the best chance of managing medical conditions that are common after age 29. Certain conditions and lifestyle choices may make you more likely to have a fall. Your health  care provider may recommend: Regular vision checks. Poor vision and conditions such as cataracts can make you more likely to have a fall. If you wear glasses, make sure to get your prescription updated if your vision changes. Medicine review. Work with your health care provider to regularly review all of the medicines you are taking, including over-the-counter medicines. Ask your health care provider about any side effects that may make you more likely to have a fall. Tell your health care provider if any medicines that you take make you feel dizzy or sleepy. Strength and balance checks. Your health care provider may recommend certain tests to check your strength and balance while standing, walking, or changing positions. Foot health exam. Foot pain and numbness, as well as not wearing proper footwear, can make you more likely to have a fall. Screenings, including: Osteoporosis screening. Osteoporosis is a condition that causes the bones to get weaker and break more easily. Blood pressure screening. Blood pressure changes and medicines to control blood pressure can make you feel dizzy. Depression screening. You may be more likely to have a fall if you have a fear of falling, feel depressed, or feel unable to do activities that you used to do. Alcohol use screening. Using too much alcohol can affect your balance and may make you more likely to have a fall. Follow these instructions at home: Lifestyle Do not drink alcohol if: Your health care provider tells you not to drink. If you drink alcohol: Limit how much you have to: 0-1 drink a day for women. 0-2 drinks a day for men. Know how much alcohol is in your drink. In the [place] one drink equals one 12 oz bottle of beer (355 mL), one 5 oz glass of wine (148 mL), or one 1 oz glass of hard liquor (44 mL). Do not use any products that contain nicotine or tobacco. These products include cigarettes, chewing tobacco, and vaping devices, such as  e-cigarettes. If you need help quitting, ask your health care provider. Activity  Follow a regular exercise program to stay fit. This will help you maintain your balance. Ask your health care provider what types of exercise are appropriate for you. If you need a cane or walker, use it as recommended by your health care provider. Wear supportive shoes that have nonskid soles. Safety  Remove any tripping hazards, such as rugs, cords, and clutter. Install safety equipment such as grab bars in bathrooms and safety rails on stairs. Keep rooms and walkways well-lit. General instructions Talk with your health care provider about your risks for falling. Tell your health care provider if: You fall. Be sure to tell your health care provider about all falls, even ones that seem minor. You feel dizzy, tiredness (fatigue), or off-balance. Take over-the-counter and prescription medicines only as told by your health care provider. These include supplements. Eat a healthy diet and maintain a healthy weight. A healthy diet includes low-fat dairy products, low-fat (lean) meats, and fiber from whole grains, beans, and lots of fruits and vegetables. Stay current with your  vaccines. Schedule regular health, dental, and eye exams. Summary Having a healthy lifestyle and getting preventive care can help to protect your health and wellness after age 15. Screening and testing are the best way to find a health problem early and help you avoid having a fall. Early diagnosis and treatment give you the best chance for managing medical conditions that are more common for people who are older than age 43. Falls are a major cause of broken bones and head injuries in people who are older than age 96. Take precautions to prevent a fall at home. Work with your health care provider to learn what changes you can make to improve your health and wellness and to prevent falls. This information is not intended to replace advice given  to you by your health care provider. Make sure you discuss any questions you have with your health care provider. Document Revised: 07/27/2020 Document Reviewed: 07/27/2020 [employer] Patient Education  [place].

## 2022-05-02 NOTE — Progress Notes (Signed)
Subjective:   DORLAN BRAMLETTE is a 72 y.o. male who presents for Medicare Annual/Subsequent preventive examination.  Review of Systems     I connected with [name] on 05/02/2022 at 1:43 pm by telephone and verified that I am speaking with the correct person using two identifiers. I discussed the limitations, risks, security and privacy concerns of performing an evaluation and management service by telephone and the availability of in person appointments. I also discussed with the patient that there may be a patient responsible charge related to this service. The patient expressed understanding and agreed to proceed.   Patient location: Home My Location: [place] on the telephone call: Myself and Patinet     Cardiac Risk Factors include: none     Objective:    There were no vitals filed for this visit. There is no height or weight on file to calculate BMI.     05/02/2022    1:48 PM 01/11/2022    9:39 AM 12/08/2021    3:20 PM 10/28/2021   12:56 PM 10/27/2021    5:20 PM 06/09/2021   11:01 AM 12/06/2020    2:17 PM  Advanced Directives  Does Patient Have a Medical Advance Directive? Yes No Yes No Yes No No;Yes  Type of Paramedic of [place];Living will  Living will  Living will;Healthcare Power of Attorney    Does patient want to make changes to medical advance directive?   No - Patient declined      Copy of [place] in Chart?   No - copy requested      Would patient like information on creating a medical advance directive?  No - Patient declined  No - Patient declined  No - Patient declined     Current Medications (verified) Outpatient Encounter Medications as of 05/02/2022  Medication Sig   aspirin EC 81 MG tablet Take 81 mg by mouth in the morning.   atorvastatin (LIPITOR) 40 MG tablet Take 40 mg by mouth in the morning.   carvedilol (COREG) 12.5 MG tablet Take 1 tablet (12.5 mg total) by mouth 2 (two)  times daily with a meal.   Empagliflozin-metFORMIN HCl ER (SYNJARDY XR) 25-1000 MG TB24 Take 1 tablet by mouth in the morning.   ferrous sulfate 325 (65 FE) MG tablet Take 325 mg by mouth in the morning.   furosemide (LASIX) 40 MG tablet Take 1 tablet (40 mg total) by mouth daily. (Patient taking differently: Take 80 mg by mouth daily.)   gabapentin (NEURONTIN) 800 MG tablet Take 800 mg by mouth at bedtime as needed (Sleep).   hydrALAZINE (APRESOLINE) 25 MG tablet Take 1 tablet (25 mg total) by mouth 3 (three) times daily.   insulin NPH-regular Human (70-30) 100 UNIT/ML injection Inject 8 Units into the skin daily with breakfast. 7 units with dinner   isosorbide mononitrate (IMDUR) 30 MG 24 hr tablet Take 1 tablet (30 mg total) by mouth daily.   PRESCRIPTION MEDICATION Inject 0.1-1 mLs as directed See admin instructions. Tri-Mix Standard Strength 5 ml. Formula: Prostaglandin 10 mcg/ml, Papaverine 30 mg/ml, Phentolamine 1 mg/ml. Inject 0.1 ml into side of penis as directed as needed (to be injected immediately before sexual intercourse) may increase the dose by 0.1 ml every 48 hours to achieve an erection. Max dose 1 ml.   sacubitril-valsartan (ENTRESTO) 49-51 MG Take 1 tablet by mouth 2 (two) times daily.   vitamin B-12 (CYANOCOBALAMIN) 500 MCG tablet Take  500 mcg by mouth in the morning.   Facility-Administered Encounter Medications as of 05/02/2022  Medication   sodium chloride flush (NS) 0.9 % injection 3 mL   sodium chloride flush (NS) 0.9 % injection 3 mL    Allergies (verified) Codeine and Percocet [oxycodone-acetaminophen]   History: Past Medical History:  Diagnosis Date   Carotid artery occlusion    Diabetes mellitus without complication ([place])    Type II   Dyslipidemia 09/27/2012   Erectile dysfunction 09/27/2012   GERD (gastroesophageal reflux disease)    Hypercholesteremia    Hyperlipidemia 08/12/2012   Hypertension    Hyponatremia 08/14/2012   Neuropathy    Pancreatitis     Pancreatitis, acute 09/27/2012   Peripheral vascular disease (HCC)    S/P 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 Burt Knack & Bartle   Severe aortic stenosis    Vitamin D deficiency 06/23/2015   Past Surgical History:  Procedure Laterality Date   ABDOMINAL AORTOGRAM W/LOWER EXTREMITY N/A 08/08/2019   Procedure: ABDOMINAL AORTOGRAM W/LOWER EXTREMITY;  Surgeon: Lorretta Harp, MD;  Location: [facility];  Service: Cardiovascular;  Laterality: N/A;   ABDOMINAL AORTOGRAM W/LOWER EXTREMITY N/A 11/18/2019   Procedure: ABDOMINAL AORTOGRAM W/LOWER EXTREMITY;  Surgeon: Lorretta Harp, MD;  Location: [facility];  Service: Cardiovascular;  Laterality: N/A;   CARDIAC CATHETERIZATION     COLONOSCOPY  12 years ago   in [place] clinic= normal exam per pt   INTRAOPERATIVE TRANSTHORACIC ECHOCARDIOGRAM N/A 01/11/2022   Procedure: INTRAOPERATIVE TRANSTHORACIC ECHOCARDIOGRAM;  Surgeon: Sherren Mocha, MD;  Location: [facility];  Service: Open Heart Surgery;  Laterality: N/A;   KNEE SURGERY     MULTIPLE EXTRACTIONS WITH ALVEOLOPLASTY N/A 12/09/2021   Procedure: MULTIPLE EXTRACTION;  Surgeon: Charlaine Dalton, DMD;  Location: [facility];  Service: Dentistry;  Laterality: N/A;   PERIPHERAL VASCULAR INTERVENTION Right 08/08/2019   Procedure: PERIPHERAL VASCULAR INTERVENTION;  Surgeon: Lorretta Harp, MD;  Location: [facility];  Service: Cardiovascular;  Laterality: Right;   PERIPHERAL VASCULAR INTERVENTION Left 11/18/2019   Procedure: PERIPHERAL VASCULAR INTERVENTION;  Surgeon: Lorretta Harp, MD;  Location: [facility];  Service: Cardiovascular;  Laterality: Left;  left popliteal artery   RIGHT/LEFT HEART CATH AND CORONARY ANGIOGRAPHY N/A 10/29/2021   Procedure: RIGHT/LEFT HEART CATH AND CORONARY ANGIOGRAPHY;  Surgeon: Burnell Blanks, MD;  Location: [facility];  Service:  Cardiovascular;  Laterality: N/A;   TRANSCAROTID ARTERY REVASCULARIZATION  Left 12/16/2019   Procedure: TRANSCAROTID ARTERY REVASCULARIZATION;  Surgeon: Elam Dutch, MD;  Location: [facility];  Service: Vascular;  Laterality: Left;   TRANSCAROTID ARTERY REVASCULARIZATION  Right 02/03/2020   Procedure: RIGHT TRANSCAROTID ARTERY REVASCULARIZATION;  Surgeon: Elam Dutch, MD;  Location: [facility];  Service: Vascular;  Laterality: Right;   TRANSCATHETER AORTIC VALVE REPLACEMENT, TRANSFEMORAL N/A 01/11/2022   Procedure: Transcatheter Aortic Valve Replacement, Transfemoral;  Surgeon: Sherren Mocha, MD;  Location: [facility];  Service: Open Heart Surgery;  Laterality: N/A;   ULTRASOUND GUIDANCE FOR VASCULAR ACCESS Right 12/16/2019   Procedure: ULTRASOUND GUIDANCE FOR VASCULAR ACCESS;  Surgeon: Elam Dutch, MD;  Location: [facility];  Service: Vascular;  Laterality: Right;   ULTRASOUND GUIDANCE FOR VASCULAR ACCESS Right 02/03/2020   Procedure: ULTRASOUND GUIDANCE FOR VASCULAR ACCESS;  Surgeon: Elam Dutch, MD;  Location: [facility];  Service: Vascular;  Laterality: Right;  VASECTOMY     Family History  Problem Relation Age of Onset   CAD Father    Hypertension Father    Alcohol abuse Father        Cause of death   Diabetes Mother    Colon polyps Mother    CAD Brother 48       CABG   Colon cancer Neg Hx    Esophageal cancer Neg Hx    Rectal cancer Neg Hx    Stomach cancer Neg Hx    Social History   Socioeconomic History   Marital status: Widowed    Spouse name: Not on file   Number of children: 3   Years of education: Not on file   Highest education level: High school graduate  Occupational History   Occupation: Caregiver    Comment: Special needs    Comment: retired  Tobacco Use   Smoking status: Never   Smokeless tobacco: Never  Vaping Use   Vaping Use: Never used  Substance and Sexual Activity   Alcohol use: Yes    Comment: occasional   Drug use: No    Sexual activity: Not Currently  Other Topics Concern   Not on file  Social History Narrative   Widower.  3 daughters and 3 grandchildren.     Social Determinants of Health   Financial Resource Strain: Low Risk  (10/29/2021)   Overall Financial Resource Strain (CARDIA)    Difficulty of Paying Living Expenses: Not hard at all  Food Insecurity: No Food Insecurity (01/14/2022)   Hunger Vital Sign    Worried About Running Out of Food in the Last Year: Never true    Ran Out of Food in the Last Year: Never true  Transportation Needs: No Transportation Needs (01/14/2022)   PRAPARE - Hydrologist (Medical): No    Lack of Transportation (Non-Medical): No  Physical Activity: Sufficiently Active (12/06/2020)   Exercise Vital Sign    Days of Exercise per Week: 4 days    Minutes of Exercise per Session: 60 min  Stress: No Stress Concern Present (12/06/2020)   [place]    Feeling of Stress : Not at all  Social Connections: Moderately Isolated (12/06/2020)   Social Connection and Isolation Panel [NHANES]    Frequency of Communication with Friends and Family: More than three times a week    Frequency of Social Gatherings with Friends and Family: More than three times a week    Attends Religious Services: More than 4 times per year    Active Member of [employer] or Organizations: No    Attends Archivist Meetings: Never    Marital Status: Widowed    Tobacco Counseling Counseling given: No   Clinical Intake:  Pre-visit preparation completed: No  Pain : No/denies pain     Nutritional Risks: None Diabetes: Yes CBG done?: No  How often do you need to have someone help you when you read instructions, pamphlets, or other written materials from your doctor or pharmacy?: 1 - Never  Diabetic?No  Interpreter Needed?: No      Activities of Daily Living    05/02/2022    1:48 PM 01/11/2022     5:50 PM  In your present state of health, do you have any difficulty performing the following activities:  Hearing? 0   Vision? 0   Difficulty concentrating or making decisions? 0   Walking or climbing stairs? 0  Dressing or bathing? 0   Doing errands, shopping? 0 0  Preparing Food and eating ? N   Using the Toilet? N   In the past six months, have you accidently leaked urine? N   Do you have problems with loss of bowel control? N   Managing your Medications? N   Managing your Finances? N   Housekeeping or managing your Housekeeping? N     Patient Care Team: [name], MD as PCP - General (Family Medicine) Lorretta Harp, MD as PCP - Cardiology (Cardiology) Rex Kras, Claudette Stapler, RN as [place] any recent Medical Services you may have received from other than [facility] providers in the past year (date may be approximate).     Assessment:   This is a routine wellness examination for [place].  Hearing/Vision screen No results found.  Dietary issues and exercise activities discussed: Current Exercise Habits: Home exercise routine, Type of exercise: walking, Time (Minutes): 30, Frequency (Times/Week): 7, Weekly Exercise (Minutes/Week): 210, Intensity: Mild, Exercise limited by: None identified   Goals Addressed   None    Depression Screen    05/02/2022    1:48 PM 04/25/2022   10:10 AM 01/20/2022   10:47 AM 11/18/2021   11:20 AM 07/20/2021    9:47 AM 06/09/2021   10:52 AM 01/19/2021    9:45 AM  PHQ 2/9 Scores  PHQ - 2 Score 0 0 0  0 0 0  PHQ- 9 Score  1 0  0  0  Exception Documentation    Patient refusal       Fall Risk    05/02/2022    1:48 PM 04/25/2022   10:10 AM 01/20/2022   10:47 AM 11/18/2021   11:17 AM 09/09/2021   10:25 AM  [place] in the past year? 0 0 0 0 0  Number falls in past yr: 0 0 0 0 0  Injury with Fall? 0 0 0 0 0  Risk for fall due to : No Fall Risks  No Fall Risks  Medication side effect  Follow up  Education provided    Falls evaluation completed    [place]:  Any stairs in or around the home? Yes  If so, are there any without handrails? No  Home free of loose throw rugs in walkways, pet beds, electrical cords, etc? Yes  Adequate lighting in your home to reduce risk of falls? Yes   ASSISTIVE DEVICES UTILIZED TO PREVENT FALLS:  Life alert? No  Use of a cane, walker or w/c? No  Grab bars in the bathroom? Yes  Shower chair or bench in shower? No  Elevated toilet seat or a handicapped toilet? No   TIMED UP AND GO:  Was the test performed? No .  Length of time to ambulate 10 feet: n/a sec.   Gait slow and steady without use of assistive device  Cognitive Function:    05/02/2022    1:49 PM  MMSE - Mini Mental State Exam  Orientation to time 5  Orientation to Place 5  Registration 3  Attention/ Calculation 5  Recall 3  Language- name 2 objects 2  Language- repeat 1  Language- follow 3 step command 3  Language- read & follow direction 1  Write a sentence 1  Copy design 1  Total score 30        05/02/2022    1:50 PM 12/06/2020    2:21  PM  6CIT Screen  What Year? 0 points 0 points  What month? 0 points 0 points  What time? 0 points 0 points  Count back from 20 0 points 0 points  Months in reverse 0 points 0 points  Repeat phrase 0 points 0 points  Total Score 0 points 0 points    Immunizations Immunization History  Administered Date(s) Administered   Covid-19, Mrna,Vaccine(Spikevax)46yr and older 04/12/2022   Fluad Quad(high Dose 65+) 01/19/2021   Influenza,inj,Quad PF,6+ Mos 02/26/2020   Moderna Sars-Covid-2 Vaccination 04/19/2019, 05/17/2019, 01/13/2020, 06/23/2020   Pneumococcal Conjugate-13 09/13/2016, 11/09/2017   Pneumococcal Polysaccharide-23 08/15/2012, 01/14/2019   Tdap 05/26/2014, 06/06/2016    TDAP status: Up to date  Flu Vaccine status: Declined, Education has been provided regarding the importance of  this vaccine but patient still declined. Advised may receive this vaccine at local pharmacy or Health Dept. Aware to provide a copy of the vaccination record if obtained from local pharmacy or Health Dept. Verbalized acceptance and understanding.  Pneumococcal vaccine status: Up to date  Covid-19 vaccine status: Completed vaccines  Qualifies for Shingles Vaccine? Yes   Zostavax completed  Patient declined   Shingrix Completed?: No.    Education has been provided regarding the importance of this vaccine. Patient has been advised to call insurance company to determine out of pocket expense if they have not yet received this vaccine. Advised may also receive vaccine at local pharmacy or Health Dept. Verbalized acceptance and understanding.  Screening Tests Health Maintenance  Topic Date Due   Zoster Vaccines- Shingrix (1 of 2) Never done   Medicare Annual Wellness (AWV)  12/06/2021   INFLUENZA VACCINE  06/19/2022 (Originally 10/19/2021)   COVID-19 Vaccine (7 - 2023-24 season) 06/07/2022   Diabetic kidney evaluation - Urine ACR  07/21/2022   HEMOGLOBIN A1C  10/24/2022   OPHTHALMOLOGY EXAM  12/15/2022   Diabetic kidney evaluation - eGFR measurement  03/10/2023   FOOT EXAM  04/29/2023   COLONOSCOPY (Pts 45-429yrInsurance coverage will need to be confirmed)  02/19/2026   DTaP/Tdap/Td (3 - Td or Tdap) 06/07/2026   Pneumonia Vaccine 6576Years old  Completed   Hepatitis C Screening  Completed   HPV VACCINES  Aged Out    Health Maintenance  Health Maintenance Due  Topic Date Due   Zoster Vaccines- Shingrix (1 of 2) Never done   Medicare Annual Wellness (AWV)  12/06/2021    Colorectal cancer screening: Type of screening: Colonoscopy. Completed 02/20/2019. Repeat every 7 years  Lung Cancer Screening: (Low Dose CT Chest recommended if Age 72-80ears, 30 pack-year currently smoking OR have quit w/in 15years.) does not qualify.   Lung Cancer Screening Referral: No  Additional  Screening:  Hepatitis C Screening: does qualify; Completed 06/06/2016  Vision Screening: Recommended annual ophthalmology exams for early detection of glaucoma and other disorders of the eye. Is the patient up to date with their annual eye exam?  Yes  Who is the provider or what is the name of the office in which the patient attends annual eye exams? [facility]  If pt is not established with a provider, would they like to be referred to a provider to establish care? No .   Dental Screening: Recommended annual dental exams for proper oral hygiene  Community Resource Referral / Chronic Care Management: CRR required this visit?  No   CCM required this visit?  No      Plan:     I have personally reviewed and noted the  following in the patient's chart:   Medical and social history Use of alcohol, tobacco or illicit drugs  Current medications and supplements including opioid prescriptions. Patient is not currently taking opioid prescriptions. Functional ability and status Nutritional status Physical activity Advanced directives List of other physicians Hospitalizations, surgeries, and ER visits in previous 12 months Vitals Screenings to include cognitive, depression, and falls Referrals and appointments  In addition, I have reviewed and discussed with patient certain preventive protocols, quality metrics, and best practice recommendations. A written personalized care plan for preventive services as well as general preventive health recommendations were provided to patient.     Gomez Cleverly, [place]   05/02/2022   Nurse Notes: I spent 30 minutes on this telephone encounter AVS mailed to patinet

## 2022-05-11 ENCOUNTER — Ambulatory Visit: Payer: Self-pay

## 2022-05-11 NOTE — Progress Notes (Signed)
Established Carotid Patient   History of Present Illness   Caleb [name] is a 72 y.o. ([birth year]) male who presents for surveillance of bilateral carotid stenosis.  [name] has a history of left TCAR on 12/16/2019 by Caleb [name] and subsequent right TCAR on 02/03/2020 by Caleb [name].  This was done to treat asymptomatic, high-grade lesions that required treatment due to high risk aortic stenosis with decreased EF.  [name] also has a history of PAD that is followed by Caleb [name], with multiple stents in the right lower extremity.  In October 2023 [name] underwent transfemoral aortic valve placement for severe aortic stenosis.  [name] had been lost to follow-up with our office prior to this procedure, and presented a few weeks ago to reestablish care.  [name] denied stroke-like symptoms, and also denied claudication or rest pain.  Caleb [name] a very active, independent lifestyle, swimming multiple times per week.  [name] has been nursing a right heel wound, which is being followed by Caleb [name], [facility].   Current Outpatient Medications  Medication Sig Dispense Refill   aspirin EC 81 MG tablet Take 81 mg by mouth in the morning.     atorvastatin (LIPITOR) 40 MG tablet Take 40 mg by mouth in the morning.     carvedilol (COREG) 12.5 MG tablet Take 1 tablet (12.5 mg total) by mouth 2 (two) times daily with a meal. 60 tablet 0   Empagliflozin-metFORMIN HCl ER (SYNJARDY XR) 25-1000 MG TB24 Take 1 tablet by mouth in the morning.     ferrous sulfate 325 (65 FE) MG tablet Take 325 mg by mouth in the morning.     furosemide (LASIX) 40 MG tablet Take 1 tablet (40 mg total) by mouth daily. (Patient taking differently: Take 80 mg by mouth daily.) 30 tablet 0   gabapentin (NEURONTIN) 800 MG tablet Take 800 mg by mouth at bedtime as needed (Sleep).     hydrALAZINE (APRESOLINE) 25 MG tablet Take 1 tablet (25 mg total) by mouth 3 (three) times daily. 90 tablet 3   insulin NPH-regular Human (70-30) 100 UNIT/ML injection  Inject 8 Units into the skin daily with breakfast. 7 units with dinner     isosorbide mononitrate (IMDUR) 30 MG 24 hr tablet Take 1 tablet (30 mg total) by mouth daily. 30 tablet 3   PRESCRIPTION MEDICATION Inject 0.1-1 mLs as directed See admin instructions. Caleb [name] 5 ml. Formula: Prostaglandin 10 mcg/ml, Papaverine 30 mg/ml, Phentolamine 1 mg/ml. Inject 0.1 ml into side of penis as directed as needed (to be injected immediately before sexual intercourse) may increase the dose by 0.1 ml every 48 hours to achieve an erection. Max dose 1 ml.     sacubitril-valsartan (ENTRESTO) 49-51 MG Take 1 tablet by mouth 2 (two) times daily. 60 tablet 11   vitamin B-12 (CYANOCOBALAMIN) 500 MCG tablet Take 500 mcg by mouth in the morning.     Current Facility-Administered Medications  Medication Dose Route Frequency Provider Last Rate Last Admin   sodium chloride flush (NS) 0.9 % injection 3 mL  3 mL Intravenous Q12H Caleb Harp, MD       sodium chloride flush (NS) 0.9 % injection 3 mL  3 mL Intravenous Q12H Caleb Harp, MD        REVIEW OF SYSTEMS (negative unless checked):   Cardiac:  '[]'$  Chest pain or chest pressure? '[]'$  Shortness of breath upon activity? '[]'$  Shortness of breath when lying flat? '[]'$  Irregular heart  rhythm?  Vascular:  '[]'$  Pain in calf, thigh, or hip brought on by walking? '[]'$  Pain in feet at night that wakes you up from your sleep? '[]'$  Blood clot in your veins? '[]'$  Leg swelling?  Pulmonary:  '[]'$  Oxygen at home? '[]'$  Productive cough? '[]'$  Wheezing?  Neurologic:  '[]'$  Sudden weakness in arms or legs? '[]'$  Sudden numbness in arms or legs? '[]'$  Sudden onset of difficult speaking or slurred speech? '[]'$  Temporary loss of vision in one eye? '[]'$  Problems with dizziness?  Gastrointestinal:  '[]'$  Blood in stool? '[]'$  Vomited blood?  Genitourinary:  '[]'$  Burning when urinating? '[]'$  Blood in urine?  Psychiatric:  '[]'$  Major depression  Hematologic:  '[]'$  Bleeding  problems? '[]'$  Problems with blood clotting?  Dermatologic:  '[]'$  Rashes or ulcers?  Constitutional:  '[]'$  Fever or chills?  Ear/Nose/Throat:  '[]'$  Change in hearing? '[]'$  Nose bleeds? '[]'$  Sore throat?  Musculoskeletal:  '[]'$  Back pain? '[]'$  Joint pain? '[]'$  Muscle pain?   Physical Examination   There were no vitals filed for this visit.  There is no height or weight on file to calculate BMI.  General:  WDWN in NAD; vital signs documented above Gait: Not observed HENT: WNL, normocephalic Pulmonary: normal non-labored breathing Cardiac: regular rate and rhythm, no carotid bruit Abdomen: soft, NT, no masses Skin: without rashes Vascular Exam/Pulses: Palpable radial pulses bilaterally, nonpalpable pedal pulses [name]: Without ischemic changes, gangrene, edema Musculoskeletal: no muscle wasting or atrophy  Neurologic: A&O X 3;  No focal weakness or paresthesias are detected Psychiatric:  The pt has Normal affect.  Non-Invasive Vascular Imaging   B Carotid Duplex (04/28/2022):  Right Carotid Findings:  +----------+--------+--------+--------+------------------+--------+           PSV cm/sEDV cm/sStenosisPlaque DescriptionComments  +----------+--------+--------+--------+------------------+--------+  CCA Prox  57      10                                          +----------+--------+--------+--------+------------------+--------+  CCA Distal86      17                                          +----------+--------+--------+--------+------------------+--------+  ECA      93                                                  +----------+--------+--------+--------+------------------+--------+   +----------+--------+-------+----------------+-------------------+           PSV cm/sEDV cmsDescribe        Arm Pressure (mmHG)  +----------+--------+-------+----------------+-------------------+  [id number]            Multiphasic, [id number]                   +----------+--------+-------+----------------+-------------------+   +---------+--------+--+--------+-+---------+  VertebralPSV cm/s23EDV cm/s9Antegrade  +---------+--------+--+--------+-+---------+      Right Stent(s):  +---------------+--------+--------+--------+--------+--------+  ICA           PSV cm/sEDV cm/sStenosisWaveformComments  +---------------+--------+--------+--------+--------+--------+  Prox to Stent  86      17                                +---------------+--------+--------+--------+--------+--------+  Proximal Stent 88      14                                +---------------+--------+--------+--------+--------+--------+  Mid Stent      83      29                                +---------------+--------+--------+--------+--------+--------+  Distal Stent   67      18                                +---------------+--------+--------+--------+--------+--------+  Distal to Stent39      14                                +---------------+--------+--------+--------+--------+--------+          Left Carotid Findings:  +----------+--------+--------+--------+------------------+-----------------  -+           PSV cm/sEDV cm/sStenosisPlaque DescriptionComments             +----------+--------+--------+--------+------------------+-----------------  -+  CCA Prox  64      7                                                      +----------+--------+--------+--------+------------------+-----------------  -+  CCA Distal38      10                                intimal  thickening  +----------+--------+--------+--------+------------------+-----------------  -+  ECA      252             >50%                                           +----------+--------+--------+--------+------------------+-----------------  -+   +----------+--------+--------+----------------+-------------------+           PSV  cm/sEDV cm/sDescribe        Arm Pressure (mmHG)  +----------+--------+--------+----------------+-------------------+  [id number]             Multiphasic, [id number]                  +----------+--------+--------+----------------+-------------------+   +---------+--------+--+--------+--+---------+  VertebralPSV cm/s38EDV cm/s11Antegrade  +---------+--------+--+--------+--+---------+      Left Stent(s):  +---------------+--------+--------+-------------+--------+--------+  ICA           PSV cm/sEDV cm/sStenosis     WaveformComments  +---------------+--------+--------+-------------+--------+--------+  Prox to Stent  38      10                                     +---------------+--------+--------+-------------+--------+--------+  Proximal Stent 331     149     >75% stenosis                  +---------------+--------+--------+-------------+--------+--------+  Mid Stent      238     76                                     +---------------+--------+--------+-------------+--------+--------+  Distal Stent   80      20                                     +---------------+--------+--------+-------------+--------+--------+  Distal to Stent35      17                                     +---------------+--------+--------+-------------+--------+--------+   CT angio demonstrates greater than 80% stenosis of the left carotid stent.  Medical Decision Making   Caleb [name] is a 72 y.o. male who presents in follow-up after bilateral carotid artery duplex ultrasound demonstrated concern for severe stenosis.  Follow-up CT angiogram confirmed the stenosis in the left carotid stent.  Caleb had a long discussion with Caleb [name] regarding the above.  [name] would benefit from reexposure of the left common carotid artery with angioplasty of the stent from a transcarotid artery approach to lower his future stroke risk.  After discussing the risk and benefits the above,  Caleb [name] to proceed.  Caleb [name], Caleb [name] has nonpalpable pulses and a wound on his right heel.  Caleb plan to reach out to Caleb [name] not only for cardiology clearance prior to redo TCAR, but also regarding lower extremity PVD and short interval follow-up for critical limb ischemia with right heel tissue loss.  Caleb asked that Jemery continue aspirin, high intensity statin, and added Plavix.   Broadus John MD

## 2022-05-11 NOTE — Patient Instructions (Addendum)
Visit Information  Thank you for taking time to visit with me today. Please don't hesitate to contact me if I can be of assistance to you.   Following are the goals we discussed today:   Goals Addressed               This Visit's Progress     Patient Stated     To be active again without shortness of breath (pt-stated)        Care Coordination Interventions: Evaluation of current treatment plan related to s/p TAVR and patient's adherence to plan as established by provider Advised patient a referral for Cardiac Rehab has been placed by Dr. Gwenlyn Found Reviewed the referral with patient and noted the referral has been authorized and is ready for scheduling Provided patient with the contact number for [facility] hospital, directed him to be connected to Cardiac Rehab in order to schedule, patient verbalizes understanding and plans to call today Determined patient's shortness of breath has improved since increasing his Lasix, he continues to work out and swim several days during the week Educated patient regarding PREP and patient would like a referral  Placed outbound call to PCP office requesting a referral for PREP Reviewed scheduled/upcoming provider appointments including: CTA head/neck; follow up with Vascular; follow up with Cardiology         To lower A1c <7.0% (pt-stated)        Care Coordination Interventions: Evaluation of current treatment plan related to type 2 diabetes mellitus and patient's adherence to plan as established by provider Determined patient completed a recent follow up with Endocrinologist, Dr. Kelton Pillar Review of patient status, including review of consultant's reports, relevant laboratory and other test results, and medications completed Educated patient regarding target A1c <7.0 % Educated patient about the potential complications secondary to having elevated A1c >7.0% Discussed with patient his concerns related to having a small area on his right heel that has  tenderness, the skin is closed and intact, however patient admits to having a small "tear" from dry skin that he treated with silvadene and the skin closed Discussed patient is established with Dr. Posey Pronto, podiatrist but has not been seen recently  Educated patient while providing rationale about the importance of scheduling a f/u with Dr. Posey Pronto in order have his foot/feet evaluated, patient is agreeable and will call this MD today to schedule a f/u appt             Our next appointment is by telephone on 06/06/22 at 11:30 AM  Please call the care guide team at [phone] if you need to cancel or reschedule your appointment.   If you are experiencing a Mental Health or [place] or need someone to talk to, please call 1-800-273-TALK (toll free, 24 hour hotline) go to [facility] [address] [place] [phone])  Patient verbalizes understanding of instructions and care plan provided today and agrees to view in [place]. Active MyChart status and patient understanding of how to access instructions and care plan via MyChart confirmed with patient.     Barb Merino, RN, BSN, CCM Care Management Coordinator [facility] Care Management  Direct Phone: [phone]

## 2022-05-11 NOTE — Patient Outreach (Signed)
  Care Coordination   Follow Up Visit Note   05/11/2022 Name: Caleb [name] MRN: [id number] DOB: June 23, 1950  Caleb [name] is a 72 y.o. year old male who sees [name], MD for primary care. I spoke with  Caleb [name] by phone today.  What matters to the patients health and wellness today?  Patient would like to start Cardiac Rehab. He would like a referral for PREP.     Goals Addressed               This Visit's Progress     Patient Stated     To be active again without shortness of breath (pt-stated)        Care Coordination Interventions: Evaluation of current treatment plan related to s/p TAVR and patient's adherence to plan as established by provider Advised patient a referral for Cardiac Rehab has been placed by Dr. Gwenlyn Found Reviewed the referral with patient and noted the referral has been authorized and is ready for scheduling Provided patient with the contact number for [facility] hospital, directed him to be connected to Cardiac Rehab in order to schedule, patient verbalizes understanding and plans to call today Determined patient's shortness of breath has improved since increasing his Lasix, he continues to work out and swim several days during the week Educated patient regarding PREP and patient would like a referral  Placed outbound call to PCP office requesting a referral for PREP Reviewed scheduled/upcoming provider appointments including: CTA head/neck; follow up with Vascular; follow up with Cardiology            To lower A1c <7.0% (pt-stated)        Care Coordination Interventions: Evaluation of current treatment plan related to type 2 diabetes mellitus and patient's adherence to plan as established by provider Determined patient completed a recent follow up with Endocrinologist, Dr. Kelton Pillar Review of patient status, including review of consultant's reports, relevant laboratory and other test results, and medications completed Educated patient regarding  target A1c <7.0 % Educated patient about the potential complications secondary to having elevated A1c >7.0% Discussed with patient his concerns related to having a small area on his right heel that has tenderness, the skin is closed and intact, however patient admits to having a small "tear" from dry skin that he treated with silvadene and the skin closed Discussed patient is established with Dr. Posey Pronto, podiatrist but has not been seen recently  Educated patient while providing rationale about the importance of scheduling a f/u with Dr. Posey Pronto in order have his foot/feet evaluated, patient is agreeable and will call this MD today to schedule a f/u appt             SDOH assessments and interventions completed:  No     Care Coordination Interventions:  Yes, provided   Follow up plan: Follow up call scheduled for 06/06/22 @11$ :30 AM    Encounter Outcome:  Pt. Visit Completed

## 2022-05-12 ENCOUNTER — Emergency Department (HOSPITAL_COMMUNITY): Payer: No Typology Code available for payment source

## 2022-05-12 ENCOUNTER — Other Ambulatory Visit: Payer: Self-pay

## 2022-05-12 ENCOUNTER — Emergency Department (HOSPITAL_COMMUNITY)
Admission: RE | Admit: 2022-05-12 | Discharge: 2022-05-12 | Disposition: A | Discharge status: Home / Self Care | Payer: No Typology Code available for payment source | Source: Ambulatory Visit | Attending: Vascular Surgery | Admitting: Vascular Surgery

## 2022-05-12 ENCOUNTER — Encounter (HOSPITAL_COMMUNITY): Payer: Self-pay | Admitting: Emergency Medicine

## 2022-05-12 ENCOUNTER — Emergency Department (HOSPITAL_COMMUNITY)
Admission: EM | Admit: 2022-05-12 | Discharge: 2022-05-12 | Disposition: A | Discharge status: Home / Self Care | Payer: No Typology Code available for payment source | Attending: Emergency Medicine | Admitting: Emergency Medicine

## 2022-05-12 DIAGNOSIS — Z7984 Long term (current) use of oral hypoglycemic drugs: Secondary | ICD-10-CM | POA: Insufficient documentation

## 2022-05-12 DIAGNOSIS — L97429 Non-pressure chronic ulcer of left heel and midfoot with unspecified severity: Secondary | ICD-10-CM | POA: Insufficient documentation

## 2022-05-12 DIAGNOSIS — Z79899 Other long term (current) drug therapy: Secondary | ICD-10-CM | POA: Diagnosis not present

## 2022-05-12 DIAGNOSIS — Z23 Encounter for immunization: Secondary | ICD-10-CM | POA: Insufficient documentation

## 2022-05-12 DIAGNOSIS — I6523 Occlusion and stenosis of bilateral carotid arteries: Secondary | ICD-10-CM

## 2022-05-12 DIAGNOSIS — E08621 Diabetes mellitus due to underlying condition with foot ulcer: Secondary | ICD-10-CM | POA: Diagnosis not present

## 2022-05-12 DIAGNOSIS — Z7982 Long term (current) use of aspirin: Secondary | ICD-10-CM | POA: Diagnosis not present

## 2022-05-12 DIAGNOSIS — Z974 Presence of external hearing-aid: Secondary | ICD-10-CM | POA: Diagnosis not present

## 2022-05-12 DIAGNOSIS — S91302A Unspecified open wound, left foot, initial encounter: Secondary | ICD-10-CM | POA: Diagnosis present

## 2022-05-12 DIAGNOSIS — N179 Acute kidney failure, unspecified: Secondary | ICD-10-CM

## 2022-05-12 DIAGNOSIS — E119 Type 2 diabetes mellitus without complications: Secondary | ICD-10-CM

## 2022-05-12 DIAGNOSIS — I10 Essential (primary) hypertension: Secondary | ICD-10-CM | POA: Diagnosis not present

## 2022-05-12 DIAGNOSIS — X58XXXA Exposure to other specified factors, initial encounter: Secondary | ICD-10-CM | POA: Diagnosis not present

## 2022-05-12 LAB — CBC WITH DIFFERENTIAL/PLATELET
Abs Immature Granulocytes: 0.01 10*3/uL (ref 0.00–0.07)
Basophils Absolute: 0.1 10*3/uL (ref 0.0–0.1)
Basophils Relative: 1 %
Eosinophils Absolute: 0.3 10*3/uL (ref 0.0–0.5)
Eosinophils Relative: 4 %
HCT: 47.9 % (ref 39.0–52.0)
Hemoglobin: 15.1 g/dL (ref 13.0–17.0)
Immature Granulocytes: 0 %
Lymphocytes Relative: 24 %
Lymphs Abs: 1.9 10*3/uL (ref 0.7–4.0)
MCH: 27.2 pg (ref 26.0–34.0)
MCHC: 31.5 g/dL (ref 30.0–36.0)
MCV: 86.3 fL (ref 80.0–100.0)
Monocytes Absolute: 1.2 10*3/uL — ABNORMAL HIGH (ref 0.1–1.0)
Monocytes Relative: 15 %
Neutro Abs: 4.6 10*3/uL (ref 1.7–7.7)
Neutrophils Relative %: 56 %
Platelets: 264 10*3/uL (ref 150–400)
RBC: 5.55 MIL/uL (ref 4.22–5.81)
RDW: 15.1 % (ref 11.5–15.5)
WBC: 8.1 10*3/uL (ref 4.0–10.5)
nRBC: 0 % (ref 0.0–0.2)

## 2022-05-12 LAB — BASIC METABOLIC PANEL
Anion gap: 8 (ref 5–15)
BUN: 24 mg/dL — ABNORMAL HIGH (ref 8–23)
CO2: 29 mmol/L (ref 22–32)
Calcium: 8.5 mg/dL — ABNORMAL LOW (ref 8.9–10.3)
Chloride: 97 mmol/L — ABNORMAL LOW (ref 98–111)
Creatinine, Ser: 1.8 mg/dL — ABNORMAL HIGH (ref 0.61–1.24)
GFR, Estimated: 39 mL/min — ABNORMAL LOW (ref >60)
Glucose, Bld: 121 mg/dL — ABNORMAL HIGH (ref 70–99)
Potassium: 4 mmol/L (ref 3.5–5.1)
Sodium: 134 mmol/L — ABNORMAL LOW (ref 135–145)

## 2022-05-12 LAB — POCT I-STAT CREATININE: Creatinine, Ser: 1.8 mg/dL — ABNORMAL HIGH (ref 0.61–1.24)

## 2022-05-12 LAB — CBG MONITORING, ED: Glucose-Capillary: 117 mg/dL — ABNORMAL HIGH (ref 70–99)

## 2022-05-12 MED ORDER — SODIUM CHLORIDE (PF) 0.9 % IJ SOLN
INTRAMUSCULAR | Status: AC
  Filled 2022-05-12: qty 50

## 2022-05-12 MED ORDER — DOXYCYCLINE HYCLATE 100 MG PO CAPS: 100.0000 mg | ORAL_CAPSULE | Freq: Two times a day (BID) | ORAL | 0 refills | Status: DC

## 2022-05-12 MED ORDER — IOHEXOL 350 MG/ML SOLN
75.0000 mL | Freq: Once | INTRAVENOUS | Status: AC | PRN
  Administered 2022-05-12: 75 mL via INTRAVENOUS

## 2022-05-12 MED ORDER — AMOXICILLIN-POT CLAVULANATE 875-125 MG PO TABS: 1.0000 | ORAL_TABLET | Freq: Two times a day (BID) | ORAL | 0 refills | Status: DC

## 2022-05-12 MED ORDER — TETANUS-DIPHTH-ACELL PERTUSSIS 5-2.5-18.5 LF-MCG/0.5 IM SUSY
0.5000 mL | PREFILLED_SYRINGE | Freq: Once | INTRAMUSCULAR | Status: AC
  Administered 2022-05-12: 0.5 mL via INTRAMUSCULAR
  Filled 2022-05-12: qty 0.5

## 2022-05-12 NOTE — ED Notes (Signed)
[facility] 117 in triage. Patient reports podiatrist appt Tuesday with Posey Pronto.

## 2022-05-12 NOTE — ED Triage Notes (Signed)
Patient arrives ambulatory by POV c/o right heel pain. Patient states he noticed it yesterday where it looked cracked open and put some cream on it.

## 2022-05-12 NOTE — Discharge Instructions (Signed)
You have been diagnosed with a diabetic foot ulcer.  Please take antibiotics as prescribed for the full duration, and follow-up closely with your primary care doctor for close monitoring of this wound as it is very difficult to treat.  Return if your symptoms worsen or if you have other concerns.

## 2022-05-12 NOTE — ED Provider Notes (Signed)
[place] [facility] Provider Note   CSN: [id number] Arrival date & time: 05/12/22  1630     History  Chief Complaint  Patient presents with   Foot Pain    Caleb [name] is a 72 y.o. male.  The history is provided by the patient and medical records. No language interpreter was used.  Foot Pain     72 year old male significant history of insulin-dependent diabetes, PAD, hypertension, history of critical ischemia of foot presenting with complaints of left foot wound.  Patient mention he has a wound to his right heel ongoing for nearly a month.  He applied a Silvadene cream to the on occasion.  Today he noticed that the wound appears to split open more than usual.  He denies any worsening pain no fever chills no increased numbness.  States that he is fairly active, swimming on a regular basis as well as rowing and exercising and he felt that all the extra movement may have aggravated this morning.  He is unsure of  his last tetanus status.  He mention his diabetes is well-controlled  Home Medications Prior to Admission medications   Medication Sig Start Date End Date Taking? Authorizing Provider  aspirin EC 81 MG tablet Take 81 mg by mouth in the morning.    [provider]  atorvastatin (LIPITOR) 40 MG tablet Take 40 mg by mouth in the morning. 07/03/20   [provider]  carvedilol (COREG) 12.5 MG tablet Take 1 tablet (12.5 mg total) by mouth 2 (two) times daily with a meal. 10/30/21 12/28/23  Arrien, Jimmy Picket, MD  Empagliflozin-metFORMIN HCl ER (SYNJARDY XR) 25-1000 MG TB24 Take 1 tablet by mouth in the morning.    [provider]  ferrous sulfate 325 (65 FE) MG tablet Take 325 mg by mouth in the morning.    [provider]  furosemide (LASIX) 40 MG tablet Take 1 tablet (40 mg total) by mouth daily. Patient taking differently: Take 80 mg by mouth daily. 10/31/21 12/28/23  Arrien, Jimmy Picket, MD   gabapentin (NEURONTIN) 800 MG tablet Take 800 mg by mouth at bedtime as needed (Sleep). 07/03/20   [provider]  hydrALAZINE (APRESOLINE) 25 MG tablet Take 1 tablet (25 mg total) by mouth 3 (three) times daily. 01/13/22 01/13/23  Eileen Stanford, PA-C  insulin NPH-regular Human (70-30) 100 UNIT/ML injection Inject 8 Units into the skin daily with breakfast. 7 units with dinner    [provider]  isosorbide mononitrate (IMDUR) 30 MG 24 hr tablet Take 1 tablet (30 mg total) by mouth daily. 01/13/22 01/13/23  Eileen Stanford, PA-C  PRESCRIPTION MEDICATION Inject 0.1-1 mLs as directed See admin instructions. Tri-Mix Standard Strength 5 ml. Formula: Prostaglandin 10 mcg/ml, Papaverine 30 mg/ml, Phentolamine 1 mg/ml. Inject 0.1 ml into side of penis as directed as needed (to be injected immediately before sexual intercourse) may increase the dose by 0.1 ml every 48 hours to achieve an erection. Max dose 1 ml.    [provider]  sacubitril-valsartan (ENTRESTO) 49-51 MG Take 1 tablet by mouth 2 (two) times daily. 03/07/22   Sabharwal, Aditya, DO  vitamin B-12 (CYANOCOBALAMIN) 500 MCG tablet Take 500 mcg by mouth in the morning. 08/24/21   [provider]      Allergies    Codeine and Percocet [oxycodone-acetaminophen]    Review of Systems   Review of Systems  All other systems reviewed and are negative.   Physical Exam  Updated Vital Signs BP 120/81   Pulse 74   Temp 98.2 F (36.8 C) (Oral)   Resp 18   Ht '5\' 7"'$  (1.702 m)   Wt 81.6 kg   SpO2 100%   BMI 28.19 kg/m  Physical Exam Vitals and nursing note reviewed.  Constitutional:      General: He is not in acute distress.    Appearance: He is well-developed.  HENT:     Head: Atraumatic.  Eyes:     Conjunctiva/sclera: Conjunctivae normal.  Musculoskeletal:        General: Tenderness (Right foot: There is a chronic ulcer noted to the heel of the foot with mild tenderness to palpation but no  significant erythema or warmth appreciated.  No drainage) present.     Cervical back: Neck supple.  Skin:    Findings: No rash.  Neurological:     Mental Status: He is alert.     ED Results / Procedures / Treatments   Labs (all labs ordered are listed, but only abnormal results are displayed) Labs Reviewed  BASIC METABOLIC PANEL - Abnormal; Notable for the following components:      Result Value   Sodium 134 (*)    Chloride 97 (*)    Glucose, Bld 121 (*)    BUN 24 (*)    Creatinine, Ser 1.80 (*)    Calcium 8.5 (*)    GFR, Estimated 39 (*)    All other components within normal limits  CBC WITH DIFFERENTIAL/PLATELET - Abnormal; Notable for the following components:   Monocytes Absolute 1.2 (*)    All other components within normal limits  CBG MONITORING, ED - Abnormal; Notable for the following components:   Glucose-Capillary 117 (*)    All other components within normal limits    EKG None  Radiology DG Foot Complete Right  Result Date: 05/12/2022 CLINICAL DATA:  Diabetic foot ulcer to assess for possible osteo. Right heel pain. EXAM: RIGHT FOOT COMPLETE - 3+ VIEW COMPARISON:  09/17/2019 FINDINGS: Degenerative changes in the interphalangeal joints, first metatarsal-phalangeal joint, and intertarsal joints. Small plantar calcaneal spur. No evidence of acute fracture or dislocation. No focal bone lesions. Soft tissue defect over the posterior calcaneus corresponding to the history of ulceration. No underlying bone changes to suggest osteomyelitis. Prominent vascular calcifications. IMPRESSION: Degenerative changes in the right foot. Soft tissue ulceration over the posterior calcaneus. No radiographic evidence of osteomyelitis. Electronically Signed   By: Lucienne Capers M.D.   On: 05/12/2022 17:46    Procedures Procedures    Medications Ordered in ED Medications  Tdap (BOOSTRIX) injection 0.5 mL (0.5 mLs Intramuscular Given 05/12/22 1759)    ED Course/ Medical Decision  Making/ A&P                             Medical Decision Making Amount and/or Complexity of Data Reviewed Labs: ordered. Radiology: ordered.  Risk Prescription drug management.   BP 120/81   Pulse 74   Temp 98.2 F (36.8 C) (Oral)   Resp 18   Ht '5\' 7"'$  (1.702 m)   Wt 81.6 kg   SpO2 100%   BMI 28.19 kg/m   82:54 PM 72 year old male significant history of insulin-dependent diabetes, PAD, hypertension, history of critical ischemia of foot presenting with complaints of left foot wound.  Patient mention he has a wound to his right heel ongoing for nearly a month.  He applied a Silvadene cream to the  on occasion.  Today he noticed that the wound appears to split open more than usual.  He denies any worsening pain no fever chills no increased numbness.  States that he is fairly active, swimming on a regular basis as well as rowing and exercising and he felt that all the extra movement may have aggravated this morning.  He is controlled his last tetanus status.  He mention his diabetes is well-controlled  On exam this is a well-appearing male appears younger than stated age.  He is in no acute discomfort.  On his right foot, there is a dime sized ulceration noted to the heel with several skin fissure.  No significant erythema or warmth appreciated.  No drainage.  The wounds concerning for diabetic foot ulcer.  Will obtain x-ray and basic labs for further assessment.  Tdap updated.  Workup she had.  Overall, patient is afebrile with normal vital sign.  -Labs ordered, independently viewed and interpreted by me.  Labs remarkable for Cr. 1.8 slightly worse than prior.  Normal WBC, normal H&H -The patient was maintained on a cardiac monitor.  I personally viewed and interpreted the cardiac monitored which showed an underlying rhythm of: NSR -Imaging independently viewed and interpreted by me and I agree with radiologist's interpretation.  Result remarkable for xray L foot without evidence of  osteo -This patient presents to the ED for concern of foot ulcer, this involves an extensive number of treatment options, and is a complaint that carries with it a high risk of complications and morbidity.  The differential diagnosis includes diabetic foot ulcer, osteomyelitis, abscess, cellulitis, retained fb -Co morbidities that complicate the patient evaluation includes diabetes, CKD -Treatment includes tdap -Reevaluation of the patient after these medicines showed that the patient stayed the same -PCP office notes or outside notes reviewed -Discussion with attending Dr. Regenia Skeeter -Escalation to admission/observation considered: patients feels much better, is comfortable with discharge, and will follow up with PCP -Prescription medication considered, patient comfortable with doxy and augmentin -Social Determinant of Health considered         Final Clinical Impression(s) / ED Diagnoses Final diagnoses:  Diabetic ulcer of left heel associated with diabetes mellitus due to underlying condition, unspecified ulcer stage ([place])    Rx / DC Orders ED Discharge Orders          Ordered    doxycycline (VIBRAMYCIN) 100 MG capsule  2 times daily        05/12/22 1942    amoxicillin-clavulanate (AUGMENTIN) 875-125 MG tablet  Every 12 hours        05/12/22 1942              Domenic Moras, PA-C 05/12/22 1943    Sherwood Gambler, MD 05/13/22 [id number]

## 2022-05-12 NOTE — ED Notes (Signed)
Dressing applied to foot wound

## 2022-05-13 ENCOUNTER — Ambulatory Visit (INDEPENDENT_AMBULATORY_CARE_PROVIDER_SITE_OTHER): Payer: Medicare HMO | Admitting: Vascular Surgery

## 2022-05-13 ENCOUNTER — Encounter: Payer: Self-pay | Admitting: Vascular Surgery

## 2022-05-13 VITALS — BP 129/86 | HR 72 | Temp 97.9°F | Resp 20 | Ht 67.0 in | Wt 179.0 lb

## 2022-05-13 DIAGNOSIS — I70234 Atherosclerosis of native arteries of right leg with ulceration of heel and midfoot: Secondary | ICD-10-CM

## 2022-05-13 DIAGNOSIS — I6522 Occlusion and stenosis of left carotid artery: Secondary | ICD-10-CM

## 2022-05-13 MED ORDER — CLOPIDOGREL BISULFATE 75 MG PO TABS: 75.0000 mg | ORAL_TABLET | Freq: Every day | ORAL | 6 refills | Status: DC

## 2022-05-17 ENCOUNTER — Ambulatory Visit (INDEPENDENT_AMBULATORY_CARE_PROVIDER_SITE_OTHER): Payer: Medicare HMO | Admitting: Podiatry

## 2022-05-17 DIAGNOSIS — E1151 Type 2 diabetes mellitus with diabetic peripheral angiopathy without gangrene: Secondary | ICD-10-CM | POA: Diagnosis not present

## 2022-05-17 DIAGNOSIS — R234 Changes in skin texture: Secondary | ICD-10-CM

## 2022-05-17 NOTE — Progress Notes (Signed)
Subjective:  Patient ID: Caleb [name], male    DOB: 05/03/1950,  MRN: [id number]  Chief Complaint  Patient presents with   Wound Check    Right heel wound     72 y.o. male presents with the above complaint.  Patient presents with new complaint of right heel stable eschar.  Patient states he is a diabetic this came out of nowhere.  He states that he may have been putting too much pressure on the heel.  He denies seeing anyone else prior to seeing me for this.  He is going to be seeing Dr. Alvester Chou for circulation eval soon.  He denies any other acute complaints   Review of Systems: Negative except as noted in the HPI. Denies N/V/F/Ch.  Past Medical History:  Diagnosis Date   Carotid artery occlusion    Diabetes mellitus without complication ([place])    Type II   Dyslipidemia 09/27/2012   Erectile dysfunction 09/27/2012   GERD (gastroesophageal reflux disease)    Hypercholesteremia    Hyperlipidemia 08/12/2012   Hypertension    Hyponatremia 08/14/2012   Neuropathy    Pancreatitis    Pancreatitis, acute 09/27/2012   Peripheral vascular disease ([place])    S/P 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 Burt [facility]ack & Bartle   Severe aortic stenosis    Vitamin D deficiency 06/23/2015    Current Outpatient Medications:    amoxicillin-clavulanate (AUGMENTIN) 875-125 MG tablet, Take 1 tablet by mouth every 12 (twelve) hours. (Patient not taking: Reported on 05/19/2022), Disp: 14 tablet, Rfl: 0   aspirin EC 81 MG tablet, Take 81 mg by mouth in the morning., Disp: , Rfl:    atorvastatin (LIPITOR) 40 MG tablet, Take 40 mg by mouth in the morning., Disp: , Rfl:    carvedilol (COREG) 12.5 MG tablet, Take 1 tablet (12.5 mg total) by mouth 2 (two) times daily with a meal., Disp: 60 tablet, Rfl: 0   clopidogrel (PLAVIX) 75 MG tablet, Take 1 tablet (75 mg total) by mouth daily., Disp: 30 tablet, Rfl: 6   doxycycline  (VIBRAMYCIN) 100 MG capsule, Take 1 capsule (100 mg total) by mouth 2 (two) times daily., Disp: 20 capsule, Rfl: 0   Empagliflozin-metFORMIN HCl ER (SYNJARDY XR) 25-1000 MG TB24, Take 1 tablet by mouth in the morning., Disp: , Rfl:    ferrous sulfate 325 (65 FE) MG tablet, Take 325 mg by mouth in the morning., Disp: , Rfl:    furosemide (LASIX) 80 MG tablet, Take 80 mg by mouth., Disp: , Rfl:    gabapentin (NEURONTIN) 800 MG tablet, Take 800 mg by mouth at bedtime as needed (Sleep)., Disp: , Rfl:    hydrALAZINE (APRESOLINE) 25 MG tablet, Take 1 tablet (25 mg total) by mouth 3 (three) times daily., Disp: 90 tablet, Rfl: 3   insulin NPH-regular Human (70-30) 100 UNIT/ML injection, Inject 8 Units into the skin daily with breakfast. 7 units with dinner, Disp: , Rfl:    isosorbide mononitrate (IMDUR) 30 MG 24 hr tablet, Take 1 tablet (30 mg total) by mouth daily., Disp: 30 tablet, Rfl: 3   PRESCRIPTION MEDICATION, Inject 0.1-1 mLs as directed See admin instructions. Tri-Mix Standard Strength 5 ml. Formula: Prostaglandin 10 mcg/ml, Papaverine 30 mg/ml, Phentolamine 1 mg/ml. Inject 0.1 ml into side of penis as directed as needed (to be injected immediately before sexual intercourse) may increase the dose by 0.1 ml every 48 hours  to achieve an erection. Max dose 1 ml., Disp: , Rfl:    sacubitril-valsartan (ENTRESTO) 49-51 MG, Take 1 tablet by mouth 2 (two) times daily., Disp: 60 tablet, Rfl: 11   vitamin B-12 (CYANOCOBALAMIN) 500 MCG tablet, Take 500 mcg by mouth in the morning., Disp: , Rfl:   Current Facility-Administered Medications:    sodium chloride flush (NS) 0.9 % injection 3 mL, 3 mL, Intravenous, Q12H, Lorretta Harp, MD   sodium chloride flush (NS) 0.9 % injection 3 mL, 3 mL, Intravenous, Q12H, Lorretta Harp, MD  Social History   Tobacco Use  Smoking Status Never  Smokeless Tobacco Never    Allergies  Allergen Reactions   Codeine Nausea And Vomiting   Percocet  [Oxycodone-Acetaminophen] Nausea And Vomiting   Objective:  There were no vitals filed for this visit. There is no height or weight on file to calculate BMI. Constitutional Well developed. Well nourished.  Vascular Dorsalis pedis pulses palpable bilaterally. Posterior tibial pulses palpable bilaterally. Capillary refill normal to all digits.  No cyanosis or clubbing noted. Pedal hair growth normal.  Neurologic Normal speech. Oriented to person, place, and time. Epicritic sensation to light touch grossly present bilaterally.  Dermatologic Right heel stable eschar without any signs of infection.  Superficial does not appear to be involving any deep stage.  No purulent drainage noted  Orthopedic: Normal joint ROM without pain or crepitus bilaterally. No visible deformities. No bony tenderness.   Radiographs: None Assessment:   1. Eschar of heel   2. Type II diabetes mellitus with peripheral circulatory disorder [facility])    Plan:  Patient was evaluated and treated and all questions answered.  Right heel stable eschar -All questions or concerns were discussed with the patient.  Minimal debridement was carried out for stable eschar.  I encouraged Betadine wet-to-dry dressing until resolve meant. -I encouraged patient to get his circulation status evaluated by Dr. Alvester Chou, he has a history of it.  Patient states understanding will do so immediately.  He is scheduled on June 16, 2022 -I will see him back in few weeks to reassess and to provide wound care.  No follow-ups on file.

## 2022-05-19 ENCOUNTER — Ambulatory Visit: Payer: No Typology Code available for payment source | Attending: Nurse Practitioner | Admitting: Nurse Practitioner

## 2022-05-19 ENCOUNTER — Encounter: Payer: Self-pay | Admitting: Nurse Practitioner

## 2022-05-19 ENCOUNTER — Telehealth: Payer: Self-pay | Admitting: *Deleted

## 2022-05-19 VITALS — BP 138/82 | HR 93 | Ht 67.0 in | Wt 180.6 lb

## 2022-05-19 DIAGNOSIS — I739 Peripheral vascular disease, unspecified: Secondary | ICD-10-CM

## 2022-05-19 DIAGNOSIS — I428 Other cardiomyopathies: Secondary | ICD-10-CM | POA: Diagnosis not present

## 2022-05-19 DIAGNOSIS — I70229 Atherosclerosis of native arteries of extremities with rest pain, unspecified extremity: Secondary | ICD-10-CM | POA: Diagnosis not present

## 2022-05-19 DIAGNOSIS — N1832 Chronic kidney disease, stage 3b: Secondary | ICD-10-CM | POA: Diagnosis not present

## 2022-05-19 DIAGNOSIS — I779 Disorder of arteries and arterioles, unspecified: Secondary | ICD-10-CM

## 2022-05-19 DIAGNOSIS — Z952 Presence of prosthetic heart valve: Secondary | ICD-10-CM

## 2022-05-19 DIAGNOSIS — I2510 Atherosclerotic heart disease of native coronary artery without angina pectoris: Secondary | ICD-10-CM | POA: Diagnosis not present

## 2022-05-19 DIAGNOSIS — E785 Hyperlipidemia, unspecified: Secondary | ICD-10-CM | POA: Diagnosis not present

## 2022-05-19 DIAGNOSIS — Z9889 Other specified postprocedural states: Secondary | ICD-10-CM

## 2022-05-19 DIAGNOSIS — I350 Nonrheumatic aortic (valve) stenosis: Secondary | ICD-10-CM

## 2022-05-19 DIAGNOSIS — I10 Essential (primary) hypertension: Secondary | ICD-10-CM | POA: Diagnosis not present

## 2022-05-19 DIAGNOSIS — E1169 Type 2 diabetes mellitus with other specified complication: Secondary | ICD-10-CM | POA: Diagnosis not present

## 2022-05-19 DIAGNOSIS — Z01810 Encounter for preprocedural cardiovascular examination: Secondary | ICD-10-CM

## 2022-05-19 DIAGNOSIS — I5022 Chronic systolic (congestive) heart failure: Secondary | ICD-10-CM | POA: Diagnosis not present

## 2022-05-19 NOTE — Patient Instructions (Addendum)
Medication Instructions:  Your physician recommends that you continue on your current medications as directed. Please refer to the Current Medication list given to you today.   *If you need a refill on your cardiac medications before your next appointment, please call your pharmacy*   Lab Work: NONE ordered at this time of appointment   If you have labs (blood work) drawn today and your tests are completely normal, you will receive your results only by: [place] (if you have MyChart) OR A paper copy in the mail If you have any lab test that is abnormal or we need to change your treatment, we will call you to review the results.   Testing/Procedures: Keep scheduled procedures  Your physician has requested that you have an ankle brachial index (ABI). During this test an ultrasound and blood pressure cuff are used to evaluate the arteries that supply the arms and legs with blood. Allow thirty minutes for this exam. There are no restrictions or special instructions.   Your physician has requested that you have a lower or upper extremity arterial duplex. This test is an ultrasound of the arteries in the legs or arms. It looks at arterial blood flow in the legs and arms. Allow one hour for Lower and Upper Arterial scans. There are no restrictions or special instructions   Follow-Up: At [facility], you and your health needs are our priority.  As part of our continuing mission to provide you with exceptional heart care, we have created designated Provider Care Teams.  These Care Teams include your primary Cardiologist (physician) and Advanced Practice Providers (APPs -  Physician Assistants and Nurse Practitioners) who all work together to provide you with the care you need, when you need it.  We recommend signing up for the patient portal called "MyChart".  Sign up information is provided on this After Visit Summary.  MyChart is used to connect with patients for Virtual Visits  (Telemedicine).  Patients are able to view lab/test results, encounter notes, upcoming appointments, etc.  Non-urgent messages can be sent to your provider as well.   To learn more about what you can do with MyChart, go to [email or web address].    Your next appointment:    Week of March 18   Provider:   Quay Burow, MD     Other Instructions

## 2022-05-19 NOTE — Telephone Encounter (Signed)
Patient is requesting a home health agency visits to change his dressings on a routine basis, has neuropathy and can't do it himself, please advise.

## 2022-05-19 NOTE — Addendum Note (Signed)
Addended by: Derrick Ravel on: [id number] AM   Modules accepted: Orders

## 2022-05-19 NOTE — Progress Notes (Signed)
Office Visit    Patient Name: LATISHA VANLENTEN Date of Encounter: 05/19/2022  Primary Care Provider:  [name], MD Primary Cardiologist:  Quay Burow, MD  Chief Complaint    72 year old male with a history of nonobstructive CAD, chronic systolic heart failure, NICM, severe AS s/p TAVR in 12/2021, carotid artery disease s/p bilateral TCAR, s/p right SFA/popliteal intervention, left popliteal intervention, hypertension, hyperlipidemia, CKD stage III, and type 2 diabetes who presents for follow-up related to heart failure, AS, PAD, and for preoperative cardiac evaluation.  Past Medical History    Past Medical History:  Diagnosis Date   Carotid artery occlusion    Diabetes mellitus without complication ([place])    Type II   Dyslipidemia 09/27/2012   Erectile dysfunction 09/27/2012   GERD (gastroesophageal reflux disease)    Hypercholesteremia    Hyperlipidemia 08/12/2012   Hypertension    Hyponatremia 08/14/2012   Neuropathy    Pancreatitis    Pancreatitis, acute 09/27/2012   Peripheral vascular disease ([place])    S/P 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 Burt Knack & Bartle   Severe aortic stenosis    Vitamin D deficiency 06/23/2015   Past Surgical History:  Procedure Laterality Date   ABDOMINAL AORTOGRAM W/LOWER EXTREMITY N/A 08/08/2019   Procedure: ABDOMINAL AORTOGRAM W/LOWER EXTREMITY;  Surgeon: Lorretta Harp, MD;  Location: [facility];  Service: Cardiovascular;  Laterality: N/A;   ABDOMINAL AORTOGRAM W/LOWER EXTREMITY N/A 11/18/2019   Procedure: ABDOMINAL AORTOGRAM W/LOWER EXTREMITY;  Surgeon: Lorretta Harp, MD;  Location: [facility];  Service: Cardiovascular;  Laterality: N/A;   CARDIAC CATHETERIZATION     COLONOSCOPY  12 years ago   in [place] clinic= normal exam per pt   INTRAOPERATIVE TRANSTHORACIC ECHOCARDIOGRAM N/A 01/11/2022   Procedure: INTRAOPERATIVE  TRANSTHORACIC ECHOCARDIOGRAM;  Surgeon: Sherren Mocha, MD;  Location: [facility];  Service: Open Heart Surgery;  Laterality: N/A;   KNEE SURGERY     MULTIPLE EXTRACTIONS WITH ALVEOLOPLASTY N/A 12/09/2021   Procedure: MULTIPLE EXTRACTION;  Surgeon: Charlaine Dalton, DMD;  Location: [facility];  Service: Dentistry;  Laterality: N/A;   PERIPHERAL VASCULAR INTERVENTION Right 08/08/2019   Procedure: PERIPHERAL VASCULAR INTERVENTION;  Surgeon: Lorretta Harp, MD;  Location: [facility];  Service: Cardiovascular;  Laterality: Right;   PERIPHERAL VASCULAR INTERVENTION Left 11/18/2019   Procedure: PERIPHERAL VASCULAR INTERVENTION;  Surgeon: Lorretta Harp, MD;  Location: [facility];  Service: Cardiovascular;  Laterality: Left;  left popliteal artery   RIGHT/LEFT HEART CATH AND CORONARY ANGIOGRAPHY N/A 10/29/2021   Procedure: RIGHT/LEFT HEART CATH AND CORONARY ANGIOGRAPHY;  Surgeon: Burnell Blanks, MD;  Location: [facility];  Service: Cardiovascular;  Laterality: N/A;   TRANSCAROTID ARTERY REVASCULARIZATION  Left 12/16/2019   Procedure: TRANSCAROTID ARTERY REVASCULARIZATION;  Surgeon: Elam Dutch, MD;  Location: [facility];  Service: Vascular;  Laterality: Left;   TRANSCAROTID ARTERY REVASCULARIZATION  Right 02/03/2020   Procedure: RIGHT TRANSCAROTID ARTERY REVASCULARIZATION;  Surgeon: Elam Dutch, MD;  Location: [facility];  Service: Vascular;  Laterality: Right;   TRANSCATHETER AORTIC VALVE REPLACEMENT, TRANSFEMORAL N/A 01/11/2022   Procedure: Transcatheter Aortic Valve Replacement, Transfemoral;  Surgeon: Sherren Mocha, MD;  Location: [facility];  Service: Open Heart Surgery;  Laterality: N/A;   ULTRASOUND GUIDANCE FOR VASCULAR ACCESS Right 12/16/2019   Procedure: ULTRASOUND GUIDANCE FOR VASCULAR ACCESS;  Surgeon: Ruta Hinds  E, MD;  Location: [facility];  Service: Vascular;  Laterality: Right;   ULTRASOUND GUIDANCE FOR VASCULAR ACCESS Right  02/03/2020   Procedure: ULTRASOUND GUIDANCE FOR VASCULAR ACCESS;  Surgeon: Elam Dutch, MD;  Location: [facility];  Service: Vascular;  Laterality: Right;   VASECTOMY      Allergies  Allergies  Allergen Reactions   Codeine Nausea And Vomiting   Percocet [Oxycodone-Acetaminophen] Nausea And Vomiting     Labs/Other Studies Reviewed    The following studies were reviewed today: ECHO: 02/16/22: LVEF 35%-40%, G1 DD, normal RV function; TAVR valve in place 10/28/21: LVEF 20%, Grade II diastolic dysfunction, RVF moderately reduced.  12/21/20: LVEF 50-55%, moderate AS, normal RV function 08/27/19: LVEF 40-45%, RV normal, moderate-severe AS   CATH: 10/29/21:  Prox RCA lesion is 60% stenosed.   Ramus lesion is 40% stenosed.   2nd Mrg lesion is 60% stenosed.   Mid LAD lesion is 30% stenosed.   Dist LAD lesion is 40% stenosed.   Mild non-obstructive disease in the mid and distal LAD Mild non-obstructive disease in the Circumflex and Ramus intermediate branches The RCA is a large dominant vessel with moderate proximal to mid stenosis that does not appear to be flow limiting.  Non-ischemic cardiomyopathy Severe low flow/low gradient aortic stenosis by echo  Elevated right heart pressures.     Recent Labs: 10/28/2021: TSH 2.565 01/12/2022: Magnesium 1.8 03/07/2022: B Natriuretic Peptide 728.7 03/09/2022: ALT 51 05/12/2022: BUN 24; Creatinine, Ser 1.80; Hemoglobin 15.1; Platelets 264; Potassium 4.0; Sodium 134  Recent Lipid Panel    Component Value Date/Time   CHOL 129 03/07/2022 1145   CHOL 106 06/05/2020 0947   TRIG 85 03/07/2022 1145   HDL 47 03/07/2022 1145   HDL 53 06/05/2020 0947   CHOLHDL 2.7 03/07/2022 1145   VLDL 17 03/07/2022 1145   [place] 65 03/07/2022 1145   [place] 29 06/05/2020 0947    History of Present Illness    72 year old male with the above past medical history including nonobstructive CAD, chronic systolic heart failure, NICM, severe AS s/p TAVR in  12/2021, carotid artery disease s/p bilateral TCAR, s/p right SFA/popliteal intervention, left popliteal intervention, hypertension, hyperlipidemia, CKD stage III, and type 2 diabetes.  He was hospitalized in 10/2021 in the setting of acute systolic heart failure.  Echocardiogram in 10/2021 revealed significant reduction in EF from 55% to 20% with LFLG AS.  Cardiac catheterization in 10/2021 revealed nonobstructive CAD.  He was started on GDMT and ultimately underwent TAVR with a 26 mm Edwards SAPIEN 3 on 01/11/2022.  PYP scan during TAVR evaluation was inconclusive for cardiac amyloid.  He has a history of PAD and has undergone multiple interventions per Dr. Gwenlyn Found including drug-coated balloon angioplasty for high-grade right SFA and popliteal stenosis in 07/2019, left popliteal CTO intervention in 10/2019 with self-expanding stent.  He underwent left fourth and fifth toe amputation 11/2019.  Most recent Doppler studies in 05/2021 revealed a right ABI of 0.63, left of 0.79 with high-frequency signal in his proximal right SFA and occluded left popliteal artery stent.  He was referred to the advanced heart failure clinic for ongoing management of chronic systolic heart failure, evaluation for cardiac amyloid, though workup was not suggestive of amyloid.  Most recent echocardiogram in 01/2022 showed EF 35 to 40%, G1 DD, normal RV function, stable TAVR.  He was last seen in the office on 03/07/2022 and was stable from a cardiac standpoint.  He has been followed by Dr. Posey Pronto with podiatry  for a chronic right heel wound with concern for critical limb ischemia. Additionally, he follows with vascular surgery for history of carotid artery stenosis.  Repeat carotid Dopplers in 04/2022 showed concern for severe stenosis in the left carotid stent.  Follow-up CT angiogram confirmed this finding. He was advised to follow-up with Dr. Gwenlyn Found for surgical clearance for redo TCAR as well as follow-up regarding lower extremity PVD, critical  limb ischemia with right heel tissue loss.   He presents today for follow-up.  Since his last visit he has done well from a cardiac standpoint.  He denies any symptoms concerning for angina, denies dyspnea, worsening edema, PND, orthopnea, weight gain.  He continues to exercise, denies worsening claudication.  He is eager to proceed with his vascular surgeries and is hoping to avoid any further complications.  Home Medications    Current Outpatient Medications  Medication Sig Dispense Refill   aspirin EC 81 MG tablet Take 81 mg by mouth in the morning.     atorvastatin (LIPITOR) 40 MG tablet Take 40 mg by mouth in the morning.     carvedilol (COREG) 12.5 MG tablet Take 1 tablet (12.5 mg total) by mouth 2 (two) times daily with a meal. 60 tablet 0   clopidogrel (PLAVIX) 75 MG tablet Take 1 tablet (75 mg total) by mouth daily. 30 tablet 6   doxycycline (VIBRAMYCIN) 100 MG capsule Take 1 capsule (100 mg total) by mouth 2 (two) times daily. 20 capsule 0   Empagliflozin-metFORMIN HCl ER (SYNJARDY XR) 25-1000 MG TB24 Take 1 tablet by mouth in the morning.     ferrous sulfate 325 (65 FE) MG tablet Take 325 mg by mouth in the morning.     furosemide (LASIX) 80 MG tablet Take 80 mg by mouth.     gabapentin (NEURONTIN) 800 MG tablet Take 800 mg by mouth at bedtime as needed (Sleep).     hydrALAZINE (APRESOLINE) 25 MG tablet Take 1 tablet (25 mg total) by mouth 3 (three) times daily. 90 tablet 3   insulin NPH-regular Human (70-30) 100 UNIT/ML injection Inject 8 Units into the skin daily with breakfast. 7 units with dinner     isosorbide mononitrate (IMDUR) 30 MG 24 hr tablet Take 1 tablet (30 mg total) by mouth daily. 30 tablet 3   PRESCRIPTION MEDICATION Inject 0.1-1 mLs as directed See admin instructions. Tri-Mix Standard Strength 5 ml. Formula: Prostaglandin 10 mcg/ml, Papaverine 30 mg/ml, Phentolamine 1 mg/ml. Inject 0.1 ml into side of penis as directed as needed (to be injected immediately before  sexual intercourse) may increase the dose by 0.1 ml every 48 hours to achieve an erection. Max dose 1 ml.     sacubitril-valsartan (ENTRESTO) 49-51 MG Take 1 tablet by mouth 2 (two) times daily. 60 tablet 11   vitamin B-12 (CYANOCOBALAMIN) 500 MCG tablet Take 500 mcg by mouth in the morning.     amoxicillin-clavulanate (AUGMENTIN) 875-125 MG tablet Take 1 tablet by mouth every 12 (twelve) hours. (Patient not taking: Reported on 05/19/2022) 14 tablet 0   Current Facility-Administered Medications  Medication Dose Route Frequency Provider Last Rate Last Admin   sodium chloride flush (NS) 0.9 % injection 3 mL  3 mL Intravenous Q12H Lorretta Harp, MD       sodium chloride flush (NS) 0.9 % injection 3 mL  3 mL Intravenous Q12H Lorretta Harp, MD         Review of Systems    He denies chest pain, palpitations, dyspnea,  pnd, orthopnea, n, v, dizziness, syncope, edema, weight gain, or early satiety. All other systems reviewed and are otherwise negative except as noted above.     Cardiac Rehabilitation Eligibility Assessment  The patient is ready to start cardiac rehabilitation from a cardiac standpoint.    Physical Exam    VS:  BP 138/82   Pulse 93   Ht '5\' 7"'$  (1.702 m)   Wt 180 lb 9.6 oz (81.9 kg)   SpO2 94%   BMI 28.29 kg/m  GEN: Well nourished, well developed, in no acute distress. HEENT: normal. Neck: Supple, no JVD, carotid bruits, or masses. Cardiac: RRR, no murmurs, rubs, or gallops. No clubbing, cyanosis, edema.  Radials 2+ and equal bilaterally. DP/PT non palpable on R, 1+ on Left.  Respiratory:  Respirations regular and unlabored, clear to auscultation bilaterally. GI: Soft, nontender, nondistended, BS + x 4. MS: no deformity or atrophy. Skin: warm and dry, no rash. Neuro:  Strength and sensation are intact. Psych: Normal affect.  Accessory Clinical Findings    ECG personally reviewed by me today -NSR, 90 bpm nonspecific ST/T wave changes- no acute changes.   Lab  Results  Component Value Date   WBC 8.1 05/12/2022   HGB 15.1 05/12/2022   HCT 47.9 05/12/2022   MCV 86.3 05/12/2022   PLT 264 05/12/2022   Lab Results  Component Value Date   CREATININE 1.80 (H) 05/12/2022   BUN 24 (H) 05/12/2022   NA 134 (L) 05/12/2022   K 4.0 05/12/2022   CL 97 (L) 05/12/2022   CO2 29 05/12/2022   Lab Results  Component Value Date   ALT 51 (H) 03/09/2022   AST 41 03/09/2022   ALKPHOS 178 (H) 03/09/2022   BILITOT 0.4 03/09/2022   Lab Results  Component Value Date   CHOL 129 03/07/2022   HDL 47 03/07/2022   LDLCALC 65 03/07/2022   TRIG 85 03/07/2022   CHOLHDL 2.7 03/07/2022    Lab Results  Component Value Date   HGBA1C 7.9 (A) 04/25/2022    Assessment & Plan   1. CAD: Cath in 2023 showed nonobstructive CAD.  Stable with no anginal symptoms.  No indication for ischemic evaluation.  Continue aspirin, carvedilol, hydralazine, Imdur, Entresto, and Lipitor.  2. Chronic systolic heart failure/NICM: Most recent echo in 01/2022 showed EF 35 to 40%, G1 DD, normal RV function, stable TAVR.  Was evaluated by the advanced heart failure clinic and ruled out for amyloid.  Euvolemic and well compensated on exam.  He recently increased his Lasix to 80 mg daily and is tolerating this well.  Continue carvedilol, hydralazine, Imdur, Entresto, and Lasix.  3. Severe AS: S/p TAVR in 12/2021.  Most recent echo in 01/2022 showed stable TAVR. Continue SBE prophylaxis.   4. Carotid artery stenosis: S/p bilateral TCAR.  He has severe restenosis of left carotid stent, following with vascular surgery.  Pending repeat L TCAR.  Continue aspirin, Plavix, Lipitor.  5. PAD: S/p drug-coated balloon angioplasty for high-grade right SFA and popliteal stenosis in 07/2019, left popliteal CTO intervention in 10/2019 with self-expanding stent. He is currently following with podiatry for right lower extremity wound.  Denies worsening claudication.  However, no palpable pulse on his right lower  extremity with concern for critical limb ischemia. He has repeat lower extremity arterial duplex, ABIs scheduled for 05/30/2022-we were unable to get a sooner appointment.  Will plan for close follow-up with Dr. Gwenlyn Found as he will likely require repeat intervention.  6. Hypertension: BP well  controlled. Continue current antihypertensive regimen.   7. Hyperlipidemia: LDL was 65 in 02/2022. Continue Lipitor.   8. CKD stage IIIb: Creatinine was 1.8 on 05/12/2022.  Stable.  9. Type 2 diabetes: A1c was 7.9 in 04/2022.  Monitored and managed per PCP.  10. Preoperative cardiac evaluation: According to the.cgpreop Revised Cardiac Risk Index (RCRI), his Perioperative Risk of Major Cardiac Event is (%): 11. His Functional Capacity in METs is: 6.45 according to the [employer] Activity Status Index (DASI). Therefore, based on ACC/AHA guidelines, patient would be at acceptable risk for the planned procedure without further cardiovascular testing. I will route this recommendation to the requesting party via Epic fax function.  11. Disposition: Follow-up in 2 weeks with Dr. Gwenlyn Found (ok per Dr. Kennon Holter nurse to schedule appt).     Lenna Sciara, NP 05/19/2022, 8:57 AM

## 2022-05-23 ENCOUNTER — Telehealth: Payer: Self-pay

## 2022-05-23 NOTE — Telephone Encounter (Signed)
Patient called back upset because know one had called him with an update about if he was given a follow up visit or not. I advised the patient that his follow up visit is on 05/26/22 and patient was very appreciative for the update.

## 2022-05-23 NOTE — Telephone Encounter (Signed)
Encounter created in error

## 2022-05-25 ENCOUNTER — Telehealth: Payer: Self-pay

## 2022-05-25 DIAGNOSIS — I110 Hypertensive heart disease with heart failure: Secondary | ICD-10-CM

## 2022-05-25 NOTE — Telephone Encounter (Signed)
Dr Caleb [name]- this is the patient that the [facility] said requested a referral to the [facility] program.

## 2022-05-26 ENCOUNTER — Telehealth: Payer: Self-pay | Admitting: *Deleted

## 2022-05-26 MED ORDER — TRAMADOL HCL 50 MG PO TABS: 50.0000 mg | ORAL_TABLET | Freq: Three times a day (TID) | ORAL | 0 refills | Status: DC | PRN

## 2022-05-26 NOTE — Telephone Encounter (Signed)
noted 

## 2022-05-26 NOTE — Telephone Encounter (Signed)
Patient is calling to ask for something stronger for pain, otc Ibuprofen is not helping, pain is keeping him from sleeping, Please advise.

## 2022-05-26 NOTE — Telephone Encounter (Signed)
Updated patient that pain medicine has been sent to pharmacy on file.

## 2022-05-26 NOTE — Telephone Encounter (Signed)
Referral has been placed. Thanks!

## 2022-05-27 ENCOUNTER — Inpatient Hospital Stay (HOSPITAL_COMMUNITY): Payer: No Typology Code available for payment source

## 2022-05-27 ENCOUNTER — Inpatient Hospital Stay (HOSPITAL_COMMUNITY)
Admission: EM | Admit: 2022-05-27 | Discharge: 2022-06-01 | DRG: 291 | Disposition: A | Discharge status: Home / Self Care | Payer: No Typology Code available for payment source | Attending: Internal Medicine | Admitting: Internal Medicine

## 2022-05-27 ENCOUNTER — Other Ambulatory Visit: Payer: Self-pay

## 2022-05-27 ENCOUNTER — Emergency Department (HOSPITAL_COMMUNITY): Payer: No Typology Code available for payment source

## 2022-05-27 ENCOUNTER — Encounter (HOSPITAL_COMMUNITY): Payer: Self-pay | Admitting: *Deleted

## 2022-05-27 DIAGNOSIS — Z79899 Other long term (current) drug therapy: Secondary | ICD-10-CM | POA: Diagnosis not present

## 2022-05-27 DIAGNOSIS — E7800 Pure hypercholesterolemia, unspecified: Secondary | ICD-10-CM | POA: Diagnosis present

## 2022-05-27 DIAGNOSIS — Z7982 Long term (current) use of aspirin: Secondary | ICD-10-CM

## 2022-05-27 DIAGNOSIS — L89619 Pressure ulcer of right heel, unspecified stage: Secondary | ICD-10-CM | POA: Diagnosis present

## 2022-05-27 DIAGNOSIS — E860 Dehydration: Secondary | ICD-10-CM | POA: Diagnosis present

## 2022-05-27 DIAGNOSIS — I509 Heart failure, unspecified: Secondary | ICD-10-CM | POA: Insufficient documentation

## 2022-05-27 DIAGNOSIS — I5022 Chronic systolic (congestive) heart failure: Secondary | ICD-10-CM | POA: Diagnosis present

## 2022-05-27 DIAGNOSIS — E875 Hyperkalemia: Secondary | ICD-10-CM | POA: Diagnosis present

## 2022-05-27 DIAGNOSIS — I5043 Acute on chronic combined systolic (congestive) and diastolic (congestive) heart failure: Secondary | ICD-10-CM | POA: Diagnosis present

## 2022-05-27 DIAGNOSIS — E1169 Type 2 diabetes mellitus with other specified complication: Secondary | ICD-10-CM | POA: Diagnosis present

## 2022-05-27 DIAGNOSIS — I959 Hypotension, unspecified: Secondary | ICD-10-CM | POA: Diagnosis not present

## 2022-05-27 DIAGNOSIS — Z8249 Family history of ischemic heart disease and other diseases of the circulatory system: Secondary | ICD-10-CM

## 2022-05-27 DIAGNOSIS — I2489 Other forms of acute ischemic heart disease: Secondary | ICD-10-CM | POA: Diagnosis present

## 2022-05-27 DIAGNOSIS — E1122 Type 2 diabetes mellitus with diabetic chronic kidney disease: Secondary | ICD-10-CM | POA: Diagnosis present

## 2022-05-27 DIAGNOSIS — I160 Hypertensive urgency: Secondary | ICD-10-CM | POA: Diagnosis present

## 2022-05-27 DIAGNOSIS — I130 Hypertensive heart and chronic kidney disease with heart failure and stage 1 through stage 4 chronic kidney disease, or unspecified chronic kidney disease: Secondary | ICD-10-CM | POA: Diagnosis not present

## 2022-05-27 DIAGNOSIS — K219 Gastro-esophageal reflux disease without esophagitis: Secondary | ICD-10-CM | POA: Diagnosis present

## 2022-05-27 DIAGNOSIS — I428 Other cardiomyopathies: Secondary | ICD-10-CM | POA: Diagnosis present

## 2022-05-27 DIAGNOSIS — I739 Peripheral vascular disease, unspecified: Secondary | ICD-10-CM | POA: Diagnosis present

## 2022-05-27 DIAGNOSIS — N189 Chronic kidney disease, unspecified: Secondary | ICD-10-CM | POA: Diagnosis present

## 2022-05-27 DIAGNOSIS — I5023 Acute on chronic systolic (congestive) heart failure: Secondary | ICD-10-CM | POA: Diagnosis present

## 2022-05-27 DIAGNOSIS — Z7902 Long term (current) use of antithrombotics/antiplatelets: Secondary | ICD-10-CM

## 2022-05-27 DIAGNOSIS — Z794 Long term (current) use of insulin: Secondary | ICD-10-CM

## 2022-05-27 DIAGNOSIS — E1151 Type 2 diabetes mellitus with diabetic peripheral angiopathy without gangrene: Secondary | ICD-10-CM | POA: Diagnosis present

## 2022-05-27 DIAGNOSIS — R112 Nausea with vomiting, unspecified: Secondary | ICD-10-CM | POA: Diagnosis present

## 2022-05-27 DIAGNOSIS — Z833 Family history of diabetes mellitus: Secondary | ICD-10-CM

## 2022-05-27 DIAGNOSIS — E1140 Type 2 diabetes mellitus with diabetic neuropathy, unspecified: Secondary | ICD-10-CM | POA: Diagnosis present

## 2022-05-27 DIAGNOSIS — Z1152 Encounter for screening for COVID-19: Secondary | ICD-10-CM

## 2022-05-27 DIAGNOSIS — E559 Vitamin D deficiency, unspecified: Secondary | ICD-10-CM | POA: Diagnosis present

## 2022-05-27 DIAGNOSIS — Z952 Presence of prosthetic heart valve: Secondary | ICD-10-CM | POA: Diagnosis not present

## 2022-05-27 DIAGNOSIS — Z7984 Long term (current) use of oral hypoglycemic drugs: Secondary | ICD-10-CM

## 2022-05-27 DIAGNOSIS — N179 Acute kidney failure, unspecified: Secondary | ICD-10-CM | POA: Diagnosis not present

## 2022-05-27 DIAGNOSIS — L89610 Pressure ulcer of right heel, unstageable: Secondary | ICD-10-CM | POA: Diagnosis not present

## 2022-05-27 DIAGNOSIS — N1832 Chronic kidney disease, stage 3b: Secondary | ICD-10-CM | POA: Diagnosis present

## 2022-05-27 DIAGNOSIS — T39395A Adverse effect of other nonsteroidal anti-inflammatory drugs [NSAID], initial encounter: Secondary | ICD-10-CM | POA: Diagnosis present

## 2022-05-27 DIAGNOSIS — Z885 Allergy status to narcotic agent status: Secondary | ICD-10-CM

## 2022-05-27 DIAGNOSIS — I2510 Atherosclerotic heart disease of native coronary artery without angina pectoris: Secondary | ICD-10-CM | POA: Diagnosis present

## 2022-05-27 DIAGNOSIS — E785 Hyperlipidemia, unspecified: Secondary | ICD-10-CM | POA: Diagnosis not present

## 2022-05-27 DIAGNOSIS — R0609 Other forms of dyspnea: Secondary | ICD-10-CM | POA: Diagnosis not present

## 2022-05-27 DIAGNOSIS — E119 Type 2 diabetes mellitus without complications: Secondary | ICD-10-CM | POA: Diagnosis present

## 2022-05-27 LAB — CBC WITH DIFFERENTIAL/PLATELET
Abs Immature Granulocytes: 0.05 10*3/uL (ref 0.00–0.07)
Basophils Absolute: 0 10*3/uL (ref 0.0–0.1)
Basophils Relative: 1 %
Eosinophils Absolute: 0 10*3/uL (ref 0.0–0.5)
Eosinophils Relative: 0 %
HCT: 45.7 % (ref 39.0–52.0)
Hemoglobin: 14.3 g/dL (ref 13.0–17.0)
Immature Granulocytes: 1 %
Lymphocytes Relative: 14 %
Lymphs Abs: 1.1 10*3/uL (ref 0.7–4.0)
MCH: 26.4 pg (ref 26.0–34.0)
MCHC: 31.3 g/dL (ref 30.0–36.0)
MCV: 84.5 fL (ref 80.0–100.0)
Monocytes Absolute: 0.5 10*3/uL (ref 0.1–1.0)
Monocytes Relative: 6 %
Neutro Abs: 6.4 10*3/uL (ref 1.7–7.7)
Neutrophils Relative %: 78 %
Platelets: 292 10*3/uL (ref 150–400)
RBC: 5.41 MIL/uL (ref 4.22–5.81)
RDW: 15.5 % (ref 11.5–15.5)
WBC: 8.1 10*3/uL (ref 4.0–10.5)
nRBC: 0 % (ref 0.0–0.2)

## 2022-05-27 LAB — COMPREHENSIVE METABOLIC PANEL
ALT: 57 U/L — ABNORMAL HIGH (ref 0–44)
AST: 50 U/L — ABNORMAL HIGH (ref 15–41)
Albumin: 3.4 g/dL — ABNORMAL LOW (ref 3.5–5.0)
Alkaline Phosphatase: 165 U/L — ABNORMAL HIGH (ref 38–126)
Anion gap: 10 (ref 5–15)
BUN: 53 mg/dL — ABNORMAL HIGH (ref 8–23)
CO2: 22 mmol/L (ref 22–32)
Calcium: 8.3 mg/dL — ABNORMAL LOW (ref 8.9–10.3)
Chloride: 101 mmol/L (ref 98–111)
Creatinine, Ser: 2.62 mg/dL — ABNORMAL HIGH (ref 0.61–1.24)
GFR, Estimated: 25 mL/min — ABNORMAL LOW (ref >60)
Glucose, Bld: 179 mg/dL — ABNORMAL HIGH (ref 70–99)
Potassium: 5.2 mmol/L — ABNORMAL HIGH (ref 3.5–5.1)
Sodium: 133 mmol/L — ABNORMAL LOW (ref 135–145)
Total Bilirubin: 0.9 mg/dL (ref 0.3–1.2)
Total Protein: 7.8 g/dL (ref 6.5–8.1)

## 2022-05-27 LAB — CREATININE, SERUM
Creatinine, Ser: 2.66 mg/dL — ABNORMAL HIGH (ref 0.61–1.24)
GFR, Estimated: 25 mL/min — ABNORMAL LOW (ref >60)

## 2022-05-27 LAB — RESP PANEL BY RT-PCR (RSV, FLU A&B, COVID)  RVPGX2
Influenza A by PCR: NEGATIVE
Influenza B by PCR: NEGATIVE
Resp Syncytial Virus by PCR: NEGATIVE
SARS Coronavirus 2 by RT PCR: NEGATIVE

## 2022-05-27 LAB — CBC
HCT: 44.8 % (ref 39.0–52.0)
Hemoglobin: 14.1 g/dL (ref 13.0–17.0)
MCH: 26.6 pg (ref 26.0–34.0)
MCHC: 31.5 g/dL (ref 30.0–36.0)
MCV: 84.4 fL (ref 80.0–100.0)
Platelets: 313 10*3/uL (ref 150–400)
RBC: 5.31 MIL/uL (ref 4.22–5.81)
RDW: 15.5 % (ref 11.5–15.5)
WBC: 8.4 10*3/uL (ref 4.0–10.5)
nRBC: 0 % (ref 0.0–0.2)

## 2022-05-27 LAB — GLUCOSE, CAPILLARY: Glucose-Capillary: 200 mg/dL — ABNORMAL HIGH (ref 70–99)

## 2022-05-27 LAB — TROPONIN I (HIGH SENSITIVITY)
Troponin I (High Sensitivity): 59 ng/L — ABNORMAL HIGH (ref <18)
Troponin I (High Sensitivity): 65 ng/L — ABNORMAL HIGH (ref <18)

## 2022-05-27 LAB — BRAIN NATRIURETIC PEPTIDE: B Natriuretic Peptide: >4500 pg/mL — ABNORMAL HIGH (ref 0.0–100.0)

## 2022-05-27 LAB — CBG MONITORING, ED: Glucose-Capillary: 204 mg/dL — ABNORMAL HIGH (ref 70–99)

## 2022-05-27 MED ORDER — CLOPIDOGREL BISULFATE 75 MG PO TABS
75.0000 mg | ORAL_TABLET | Freq: Every day | ORAL | Status: DC
  Administered 2022-05-27 – 2022-06-01 (×6): 75 mg via ORAL
  Filled 2022-05-27 (×6): qty 1

## 2022-05-27 MED ORDER — SODIUM CHLORIDE 0.9% FLUSH
3.0000 mL | Freq: Two times a day (BID) | INTRAVENOUS | Status: DC
  Administered 2022-05-27 – 2022-06-01 (×10): 3 mL via INTRAVENOUS

## 2022-05-27 MED ORDER — ORAL CARE MOUTH RINSE: 15.0000 mL | OROMUCOSAL | Status: DC | PRN

## 2022-05-27 MED ORDER — FENTANYL CITRATE PF 50 MCG/ML IJ SOSY
12.5000 ug | PREFILLED_SYRINGE | INTRAMUSCULAR | Status: DC | PRN
  Administered 2022-05-28 – 2022-06-01 (×10): 12.5 ug via INTRAVENOUS
  Filled 2022-05-27 (×11): qty 1

## 2022-05-27 MED ORDER — ISOSORBIDE MONONITRATE ER 30 MG PO TB24
30.0000 mg | ORAL_TABLET | Freq: Every day | ORAL | Status: DC
  Administered 2022-05-27 – 2022-06-01 (×5): 30 mg via ORAL
  Filled 2022-05-27 (×5): qty 1

## 2022-05-27 MED ORDER — ASPIRIN 81 MG PO TBEC
81.0000 mg | DELAYED_RELEASE_TABLET | Freq: Every day | ORAL | Status: DC
  Administered 2022-05-28 – 2022-06-01 (×5): 81 mg via ORAL
  Filled 2022-05-27 (×5): qty 1

## 2022-05-27 MED ORDER — ONDANSETRON HCL 4 MG/2ML IJ SOLN
4.0000 mg | Freq: Four times a day (QID) | INTRAMUSCULAR | Status: DC | PRN
  Administered 2022-05-27: 4 mg via INTRAVENOUS
  Filled 2022-05-27: qty 2

## 2022-05-27 MED ORDER — ASPIRIN 81 MG PO TBEC: 81.0000 mg | DELAYED_RELEASE_TABLET | Freq: Every morning | ORAL | Status: DC

## 2022-05-27 MED ORDER — ACETAMINOPHEN 325 MG PO TABS
650.0000 mg | ORAL_TABLET | Freq: Four times a day (QID) | ORAL | Status: DC | PRN
  Administered 2022-05-27 – 2022-05-31 (×6): 650 mg via ORAL
  Filled 2022-05-27 (×6): qty 2

## 2022-05-27 MED ORDER — ATORVASTATIN CALCIUM 40 MG PO TABS
40.0000 mg | ORAL_TABLET | Freq: Every morning | ORAL | Status: DC
  Administered 2022-05-28: 40 mg via ORAL
  Filled 2022-05-27: qty 1

## 2022-05-27 MED ORDER — CARVEDILOL 12.5 MG PO TABS
12.5000 mg | ORAL_TABLET | Freq: Two times a day (BID) | ORAL | Status: DC
  Administered 2022-05-29 – 2022-06-01 (×6): 12.5 mg via ORAL
  Filled 2022-05-27 (×8): qty 1

## 2022-05-27 MED ORDER — HYDRALAZINE HCL 20 MG/ML IJ SOLN
10.0000 mg | Freq: Four times a day (QID) | INTRAMUSCULAR | Status: DC | PRN
  Administered 2022-05-27: 10 mg via INTRAVENOUS
  Filled 2022-05-27: qty 1

## 2022-05-27 MED ORDER — PANTOPRAZOLE SODIUM 40 MG PO TBEC
40.0000 mg | DELAYED_RELEASE_TABLET | Freq: Every day | ORAL | Status: DC
  Administered 2022-05-27 – 2022-06-01 (×5): 40 mg via ORAL
  Filled 2022-05-27 (×5): qty 1

## 2022-05-27 MED ORDER — TRAMADOL HCL 50 MG PO TABS: 50.0000 mg | ORAL_TABLET | Freq: Three times a day (TID) | ORAL | Status: DC | PRN

## 2022-05-27 MED ORDER — SODIUM CHLORIDE 0.9% FLUSH
3.0000 mL | INTRAVENOUS | Status: DC | PRN
  Administered 2022-05-27: 3 mL via INTRAVENOUS

## 2022-05-27 MED ORDER — VITAMIN B-12 100 MCG PO TABS: 500.0000 ug | ORAL_TABLET | Freq: Every morning | ORAL | Status: DC

## 2022-05-27 MED ORDER — INSULIN ASPART 100 UNIT/ML IJ SOLN
0.0000 [IU] | Freq: Every day | INTRAMUSCULAR | Status: DC
  Administered 2022-05-30: 4 [IU] via SUBCUTANEOUS
  Administered 2022-05-31: 2 [IU] via SUBCUTANEOUS
  Filled 2022-05-27: qty 0.05

## 2022-05-27 MED ORDER — HYDRALAZINE HCL 25 MG PO TABS
25.0000 mg | ORAL_TABLET | Freq: Three times a day (TID) | ORAL | Status: DC
  Administered 2022-05-27 – 2022-05-28 (×2): 25 mg via ORAL
  Filled 2022-05-27 (×2): qty 1

## 2022-05-27 MED ORDER — SODIUM CHLORIDE 0.9 % IV SOLN: 250.0000 mL | INTRAVENOUS | Status: DC | PRN

## 2022-05-27 MED ORDER — VITAMIN B-12 100 MCG PO TABS
500.0000 ug | ORAL_TABLET | Freq: Every day | ORAL | Status: DC
  Administered 2022-05-28 – 2022-06-01 (×5): 500 ug via ORAL
  Filled 2022-05-27 (×5): qty 5

## 2022-05-27 MED ORDER — ENOXAPARIN SODIUM 30 MG/0.3ML IJ SOSY
30.0000 mg | PREFILLED_SYRINGE | INTRAMUSCULAR | Status: DC
  Administered 2022-05-27 – 2022-05-31 (×5): 30 mg via SUBCUTANEOUS
  Filled 2022-05-27 (×5): qty 0.3

## 2022-05-27 MED ORDER — INSULIN ASPART 100 UNIT/ML IJ SOLN
0.0000 [IU] | Freq: Three times a day (TID) | INTRAMUSCULAR | Status: DC
  Administered 2022-05-28: 5 [IU] via SUBCUTANEOUS
  Administered 2022-05-28 – 2022-05-30 (×5): 3 [IU] via SUBCUTANEOUS
  Administered 2022-05-30 – 2022-05-31 (×2): 2 [IU] via SUBCUTANEOUS
  Administered 2022-05-31: 5 [IU] via SUBCUTANEOUS
  Administered 2022-05-31: 2 [IU] via SUBCUTANEOUS
  Administered 2022-06-01: 3 [IU] via SUBCUTANEOUS
  Filled 2022-05-27: qty 0.15

## 2022-05-27 MED ORDER — SODIUM ZIRCONIUM CYCLOSILICATE 10 G PO PACK
10.0000 g | PACK | Freq: Once | ORAL | Status: AC
  Administered 2022-05-27: 10 g via ORAL
  Filled 2022-05-27: qty 1

## 2022-05-27 NOTE — Consult Note (Addendum)
[facility]  HISTORY AND PHYSICAL  Caleb [name] is an 72 y.o. male.    Chief Complaint: SOB  HPI: Pt is a 22 M with a PMH sig for HTN, HLD, DM II, s/p TAVR 0000000, PAD, and systolic and diastolic CHF with EF [id number] who is now seen in consultation at the request of Dr Tana Coast for evaluation and recommendations surrounding AKI on CKD.    Baseline Cr is 1.80, last at baseline 05/12/22.  Lasix increased to 80 mg daily at last cardiology appt 05/19/22.  He is on [place] as well.  He has a R heel ulcer which is causing him a lot of pain- was taking ibuprofen regularly prior to this.  Recently got prescribed tramadol which caused n/ x 4-5 this AM and made him feel terrible.    Because of all these things he came to the hospital.  Cr on admission 2.6 with K 5.2.  In this setting we are asked to see.  Reports some back pain and some labored breathing although his sats are fine on RA.  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  His brother is currently on PD.   PMH: Past Medical History:  Diagnosis Date   Carotid artery occlusion    Diabetes mellitus without complication ([place])    Type II   Dyslipidemia 09/27/2012   Erectile dysfunction 09/27/2012   GERD (gastroesophageal reflux disease)    Hypercholesteremia    Hyperlipidemia 08/12/2012   Hypertension    Hyponatremia 08/14/2012   Neuropathy    Pancreatitis    Pancreatitis, acute 09/27/2012   Peripheral vascular disease ([place])    S/P 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 Burt Knack & Bartle   Severe aortic stenosis    Vitamin D deficiency 06/23/2015   PSH: Past Surgical History:  Procedure Laterality Date   ABDOMINAL AORTOGRAM W/LOWER EXTREMITY N/A 08/08/2019   Procedure: ABDOMINAL AORTOGRAM W/LOWER EXTREMITY;  Surgeon: Lorretta Harp, MD;  Location: [place] [facility];  Service: Cardiovascular;   Laterality: N/A;   ABDOMINAL AORTOGRAM W/LOWER EXTREMITY N/A 11/18/2019   Procedure: ABDOMINAL AORTOGRAM W/LOWER EXTREMITY;  Surgeon: Lorretta Harp, MD;  Location: [place] [facility];  Service: Cardiovascular;  Laterality: N/A;   CARDIAC CATHETERIZATION     COLONOSCOPY  12 years ago   in [place] clinic= normal exam per pt   INTRAOPERATIVE TRANSTHORACIC ECHOCARDIOGRAM N/A 01/11/2022   Procedure: INTRAOPERATIVE TRANSTHORACIC ECHOCARDIOGRAM;  Surgeon: Sherren Mocha, MD;  Location: [place] [facility];  Service: Open Heart Surgery;  Laterality: N/A;   KNEE SURGERY     MULTIPLE EXTRACTIONS WITH ALVEOLOPLASTY N/A 12/09/2021   Procedure: MULTIPLE EXTRACTION;  Surgeon: Charlaine Dalton, DMD;  Location: [facility];  Service: Dentistry;  Laterality: N/A;   PERIPHERAL VASCULAR INTERVENTION Right 08/08/2019   Procedure: PERIPHERAL VASCULAR INTERVENTION;  Surgeon: Lorretta Harp, MD;  Location: [facility];  Service: Cardiovascular;  Laterality: Right;   PERIPHERAL VASCULAR INTERVENTION Left 11/18/2019   Procedure: PERIPHERAL VASCULAR INTERVENTION;  Surgeon: Lorretta Harp, MD;  Location: [facility];  Service: Cardiovascular;  Laterality: Left;  left popliteal artery   RIGHT/LEFT HEART CATH AND CORONARY ANGIOGRAPHY N/A 10/29/2021   Procedure: RIGHT/LEFT HEART CATH AND CORONARY ANGIOGRAPHY;  Surgeon: Burnell Blanks, MD;  Location: [facility];  Service: Cardiovascular;  Laterality: N/A;  TRANSCAROTID ARTERY REVASCULARIZATION  Left 12/16/2019   Procedure: TRANSCAROTID ARTERY REVASCULARIZATION;  Surgeon: Elam Dutch, MD;  Location: [facility];  Service: Vascular;  Laterality: Left;   TRANSCAROTID ARTERY REVASCULARIZATION  Right 02/03/2020   Procedure: RIGHT TRANSCAROTID ARTERY REVASCULARIZATION;  Surgeon: Elam Dutch, MD;  Location: [place];  Service: Vascular;  Laterality: Right;   TRANSCATHETER AORTIC VALVE REPLACEMENT, TRANSFEMORAL N/A 01/11/2022   Procedure:  Transcatheter Aortic Valve Replacement, Transfemoral;  Surgeon: Sherren Mocha, MD;  Location: [facility];  Service: Open Heart Surgery;  Laterality: N/A;   ULTRASOUND GUIDANCE FOR VASCULAR ACCESS Right 12/16/2019   Procedure: ULTRASOUND GUIDANCE FOR VASCULAR ACCESS;  Surgeon: Elam Dutch, MD;  Location: [facility];  Service: Vascular;  Laterality: Right;   ULTRASOUND GUIDANCE FOR VASCULAR ACCESS Right 02/03/2020   Procedure: ULTRASOUND GUIDANCE FOR VASCULAR ACCESS;  Surgeon: Elam Dutch, MD;  Location: [facility];  Service: Vascular;  Laterality: Right;   VASECTOMY      Past Medical History:  Diagnosis Date   Carotid artery occlusion    Diabetes mellitus without complication ([place])    Type II   Dyslipidemia 09/27/2012   Erectile dysfunction 09/27/2012   GERD (gastroesophageal reflux disease)    Hypercholesteremia    Hyperlipidemia 08/12/2012   Hypertension    Hyponatremia 08/14/2012   Neuropathy    Pancreatitis    Pancreatitis, acute 09/27/2012   Peripheral vascular disease ([place])    S/P 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 Burt Knack & Bartle   Severe aortic stenosis    Vitamin D deficiency 06/23/2015    Medications:  Scheduled:  [START ON 05/28/2022] aspirin EC  81 mg Oral q AM   [START ON 05/28/2022] atorvastatin  40 mg Oral q AM   [START ON 05/28/2022] carvedilol  12.5 mg Oral BID WC   clopidogrel  75 mg Oral Daily   [START ON 05/28/2022] cyanocobalamin  500 mcg Oral q AM   enoxaparin (LOVENOX) injection  30 mg Subcutaneous Q24H   hydrALAZINE  25 mg Oral TID   [START ON 05/28/2022] insulin aspart  0-15 Units Subcutaneous TID WC   insulin aspart  0-5 Units Subcutaneous QHS   isosorbide mononitrate  30 mg Oral Daily   pantoprazole  40 mg Oral Q0600   sodium chloride flush  3 mL Intravenous Q12H   sodium chloride flush  3 mL Intravenous Q12H   sodium chloride flush  3 mL Intravenous Q12H     (Not in a hospital admission)   ALLERGIES:   Allergies  Allergen Reactions   Codeine Nausea And Vomiting   Percocet [Oxycodone-Acetaminophen] Nausea And Vomiting    FAM HX: Family History  Problem Relation Age of Onset   CAD Father    Hypertension Father    Alcohol abuse Father        Cause of death   Diabetes Mother    Colon polyps Mother    CAD Brother 76       CABG   Colon cancer Neg Hx    Esophageal cancer Neg Hx    Rectal cancer Neg Hx    Stomach cancer Neg Hx     Social History:   reports that he has never smoked. He has never used smokeless tobacco. He reports current alcohol use. He reports that he does not use drugs.  ROS: ROS: all other systems reviewed and are negative except as per  HPI  Blood pressure (!) 162/120, pulse 93, temperature 97.6 F (36.4 C), temperature source Oral, resp. rate 17, height '5\' 7"'$  (1.702 m), weight 80.3 kg, SpO2 100 %. PHYSICAL EXAM: Physical Exam GEN lying in bed, awake and alert HEENT EOMI PERRL NECK + JVD PULM on RA, bibasilar crackles CV RRR crisp S2 ABD soft EXT trace LE edema, R heel with + scabbed ulcer NEURO AAO x 3 nonfocal     Results for orders placed or performed during the hospital encounter of 05/27/22 (from the past 48 hour(s))  Comprehensive metabolic panel     Status: Abnormal   Collection Time: 05/27/22  3:13 PM  Result Value Ref Range   Sodium 133 (L) 135 - 145 mmol/L   Potassium 5.2 (H) 3.5 - 5.1 mmol/L   Chloride 101 98 - 111 mmol/L   CO2 22 22 - 32 mmol/L   Glucose, Bld 179 (H) 70 - 99 mg/dL    Comment: Glucose reference range applies only to samples taken after fasting for at least 8 hours.   BUN 53 (H) 8 - 23 mg/dL   Creatinine, Ser 2.62 (H) 0.61 - 1.24 mg/dL   Calcium 8.3 (L) 8.9 - 10.3 mg/dL   Total Protein 7.8 6.5 - 8.1 g/dL   Albumin 3.4 (L) 3.5 - 5.0 g/dL   AST 50 (H) 15 - 41 U/L   ALT 57 (H) 0 - 44 U/L   Alkaline Phosphatase 165 (H) 38 - 126 U/L   Total Bilirubin 0.9 0.3 - 1.2  mg/dL   GFR, Estimated 25 (L) >60 mL/min    Comment: (NOTE) Calculated using the CKD-EPI Creatinine Equation (2021)    Anion gap 10 5 - 15    Comment: Performed at [facility], [place] [address], [place], [place] [address]  CBC with Differential     Status: None   Collection Time: 05/27/22  3:13 PM  Result Value Ref Range   WBC 8.1 4.0 - 10.5 K/uL   RBC 5.41 4.22 - 5.81 MIL/uL   Hemoglobin 14.3 13.0 - 17.0 g/dL   HCT 45.7 39.0 - 52.0 %   MCV 84.5 80.0 - 100.0 fL   MCH 26.4 26.0 - 34.0 pg   MCHC 31.3 30.0 - 36.0 g/dL   RDW 15.5 11.5 - 15.5 %   Platelets 292 150 - 400 K/uL   nRBC 0.0 0.0 - 0.2 %   Neutrophils Relative % 78 %   Neutro Abs 6.4 1.7 - 7.7 K/uL   Lymphocytes Relative 14 %   Lymphs Abs 1.1 0.7 - 4.0 K/uL   Monocytes Relative 6 %   Monocytes Absolute 0.5 0.1 - 1.0 K/uL   Eosinophils Relative 0 %   Eosinophils Absolute 0.0 0.0 - 0.5 K/uL   Basophils Relative 1 %   Basophils Absolute 0.0 0.0 - 0.1 K/uL   Immature Granulocytes 1 %   Abs Immature Granulocytes 0.05 0.00 - 0.07 K/uL    Comment: Performed at [facility], [place] [address] [place], [place] [address]  Troponin I (High Sensitivity)     Status: Abnormal   Collection Time: 05/27/22  3:13 PM  Result Value Ref Range   Troponin I (High Sensitivity) 59 (H) <18 ng/L    Comment: (NOTE) Elevated high sensitivity troponin I (hsTnI) values and significant  changes across serial measurements may suggest ACS but many other  chronic and acute conditions are known to elevate hsTnI results.  Refer to the "Links" section for chest  pain algorithms and additional  guidance. Performed at [facility], [place] [address] [place], [place] [address]   Brain natriuretic peptide     Status: Abnormal   Collection Time: 05/27/22  3:13 PM  Result Value Ref Range   B Natriuretic Peptide >4,500.0 (H) 0.0 - 100.0 pg/mL    Comment: Performed at [facility], [place]  [address] [place], [place] [address]  Resp panel by RT-PCR (RSV, Flu A&B, Covid) Anterior Nasal Swab     Status: None   Collection Time: 05/27/22  3:22 PM   Specimen: Anterior Nasal Swab  Result Value Ref Range   SARS Coronavirus 2 by RT PCR NEGATIVE NEGATIVE    Comment: (NOTE) SARS-CoV-2 target nucleic acids are NOT DETECTED.  The SARS-CoV-2 RNA is generally detectable in upper respiratory specimens during the acute phase of infection. The lowest concentration of SARS-CoV-2 viral copies this assay can detect is 138 copies/mL. A negative result does not preclude SARS-Cov-2 infection and should not be used as the sole basis for treatment or other patient management decisions. A negative result may occur with  improper specimen collection/handling, submission of specimen other than nasopharyngeal swab, presence of viral mutation(s) within the areas targeted by this assay, and inadequate number of viral copies(<138 copies/mL). A negative result must be combined with clinical observations, patient history, and epidemiological information. The expected result is Negative.  Fact Sheet for Patients:  EntrepreneurPulse.com.au  Fact Sheet for Healthcare Providers:  IncredibleEmployment.be  This test is no t yet approved or cleared by the [place] FDA and  has been authorized for detection and/or diagnosis of SARS-CoV-2 by FDA under an Emergency Use Authorization (EUA). This EUA will remain  in effect (meaning this test can be used) for the duration of the COVID-19 declaration under Section 564(b)(1) of the Act, 21 U.S.C.section 360bbb-3(b)(1), unless the authorization is terminated  or revoked sooner.       Influenza A by PCR NEGATIVE NEGATIVE   Influenza B by PCR NEGATIVE NEGATIVE    Comment: (NOTE) The Xpert Xpress SARS-CoV-2/FLU/RSV plus assay is intended as an aid in the diagnosis of influenza from Nasopharyngeal swab specimens and should  not be used as a sole basis for treatment. Nasal washings and aspirates are unacceptable for Xpert Xpress SARS-CoV-2/FLU/RSV testing.  Fact Sheet for Patients: EntrepreneurPulse.com.au  Fact Sheet for Healthcare Providers: IncredibleEmployment.be  This test is not yet approved or cleared by the [place] FDA and has been authorized for detection and/or diagnosis of SARS-CoV-2 by FDA under an Emergency Use Authorization (EUA). This EUA will remain in effect (meaning this test can be used) for the duration of the COVID-19 declaration under Section 564(b)(1) of the Act, 21 U.S.C. section 360bbb-3(b)(1), unless the authorization is terminated or revoked.     Resp Syncytial Virus by PCR NEGATIVE NEGATIVE    Comment: (NOTE) Fact Sheet for Patients: EntrepreneurPulse.com.au  Fact Sheet for Healthcare Providers: IncredibleEmployment.be  This test is not yet approved or cleared by the [place] FDA and has been authorized for detection and/or diagnosis of SARS-CoV-2 by FDA under an Emergency Use Authorization (EUA). This EUA will remain in effect (meaning this test can be used) for the duration of the COVID-19 declaration under Section 564(b)(1) of the Act, 21 U.S.C. section 360bbb-3(b)(1), unless the authorization is terminated or revoked.  Performed at [facility], [place] [address] [place], [place] [address]   CBG monitoring, ED     Status: Abnormal   Collection  Time: 05/27/22  5:27 PM  Result Value Ref Range   Glucose-Capillary 204 (H) 70 - 99 mg/dL    Comment: Glucose reference range applies only to samples taken after fasting for at least 8 hours.  Creatinine, serum     Status: Abnormal   Collection Time: 05/27/22  7:00 PM  Result Value Ref Range   Creatinine, Ser 2.66 (H) 0.61 - 1.24 mg/dL   GFR, Estimated 25 (L) >60 mL/min    Comment: (NOTE) Calculated using the CKD-EPI  Creatinine Equation (2021) Performed at [facility], [place] [address], [place],  [address]     US RENAL  Result Date: 05/27/2022 CLINICAL DATA:  Acute kidney injury EXAM: RENAL / URINARY TRACT ULTRASOUND COMPLETE COMPARISON:  CT 11/16/2021 FINDINGS: Right Kidney: Renal measurements: 10.1 x 5.1 x 5.6 cm = volume: 149 mL. Echogenicity within normal limits. No mass or hydronephrosis visualized. Left Kidney: Renal measurements: 10.9 x 5.8 x 5.5 cm = volume: 183 mL. Echogenicity within normal limits. No mass or hydronephrosis visualized. Bladder: Appears normal for degree of bladder distention. Other: None. IMPRESSION: Unremarkable sonographic appearance of the kidneys. Electronically Signed   By: Davina Poke D.O.   On: 05/27/2022 18:33   DG Chest 2 View  Result Date: 05/27/2022 CLINICAL DATA:  Shortness of breath EXAM: CHEST - 2 VIEW COMPARISON:  01/07/2022 FINDINGS: Mild cardiomegaly. TAVR. Pulmonary vascular congestion with mild diffuse interstitial prominence. No pleural effusion. No pneumothorax. IMPRESSION: Mild cardiomegaly with pulmonary vascular congestion and mild diffuse interstitial prominence, suggesting CHF with mild interstitial edema. Electronically Signed   By: Davina Poke D.O.   On: 05/27/2022 16:02    Assessment/Plan  AKI on CKD 3b: in the setting of multiple nephrotoxic meds- NSAIDS, [name], Synjardy  - renal US negative  - 24 hr urine collection in October negative for M-spike- do not feel the need to repeat now  - hold nephrotoxic agents  - anticipate he will need Lasix started back tomorrow 3/9 if Cr improving  - no indication for dialysis  - send UA and UP/C  2.  Acute on chronic systolic CHF exacerbation:  - keep on BB, hydral/ imdur  - lasix tomorrow   3.  DM II  - holding Synjardy, per primary  4.  HTN:  - reasonable control this evening  5.  Hyperkalemia;  - mild, lokelma x 1 given  6.  Diabetic ulcer  - could use a wound  care c/s  7.  N/v:  - pt correlates this with tramadol  - will replace with Tylenol   - have added to allergy list  8. Dispo: admitted  Madelon Lips 05/27/2022, 8:15 PM

## 2022-05-27 NOTE — H&P (Addendum)
History and Physical  Patient: Caleb [name] [id number] DOB: 06-11-1950 DOA: 05/27/2022 DOS: the patient was seen and examined on 05/27/2022 Patient coming from: Home  Chief Complaint:  Chief Complaint  Patient presents with   Shortness of Breath   HPI: Caleb [name] is a 72 y.o. male with PMH significant of chronic systolic CHF, EF 35 to [id number], G1 DD per 2D echo on 02/16/2022, diabetes mellitus type 2, GERD, HTN, HLP, diabetic neuropathy, aortic stenosis, TAVR in 12/2021, CKD stage IIIb presented to ED with shortness of breath, nausea and vomiting.  Patient reported that shortness of breath has been going on for at least a week, no chest pain, no significant peripheral edema.  This morning he woke up and started having nausea and vomiting with 4 episodes of emesis.  No hematemesis.  No abdominal pain or diarrhea.  He has right heel ulcer and has been seeing podiatry.  Patient reported that he had started tramadol and also has been taking a large amount of ibuprofen for the pain in the last week. Of note, patient was seen in cardiology office on 05/19/2022 and had recently increased his Lasix to 80 mg daily.  ED course:  In ED, temp 97.6, respiratory rate 17, pulse 91, BP 161/113, O2 sats 100% on room air Sodium 133, potassium 5.2, CO2 22, BUN 53, creatinine 2.62.  Creatinine was 1.8 on 05/12/2022.  Baseline creatinine 1.2-1.3 CBC unremarkable, hemoglobin 14.3, WBCs 8.1 BNP> 4500, troponin 59 CXR showed mild cardiomegaly with pulmonary vascular congestion and mild diffuse interstitial prominence suggesting CHF with mild interstitial edema EKG showed rate 88, normal sinus rhythm, ST-T wave changes, similar to previous EKG on 05/19/2022  Review of Systems: As mentioned in the history of present illness. All other systems reviewed and are negative. Past Medical History:  Diagnosis Date   Carotid artery occlusion    Diabetes mellitus without complication ([place])    Type II   Dyslipidemia  09/27/2012   Erectile dysfunction 09/27/2012   GERD (gastroesophageal reflux disease)    Hypercholesteremia    Hyperlipidemia 08/12/2012   Hypertension    Hyponatremia 08/14/2012   Neuropathy    Pancreatitis    Pancreatitis, acute 09/27/2012   Peripheral vascular disease ([place])    S/P 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 Burt Knack & Bartle   Severe aortic stenosis    Vitamin D deficiency 06/23/2015   Past Surgical History:  Procedure Laterality Date   ABDOMINAL AORTOGRAM W/LOWER EXTREMITY N/A 08/08/2019   Procedure: ABDOMINAL AORTOGRAM W/LOWER EXTREMITY;  Surgeon: Lorretta Harp, MD;  Location: [facility];  Service: Cardiovascular;  Laterality: N/A;   ABDOMINAL AORTOGRAM W/LOWER EXTREMITY N/A 11/18/2019   Procedure: ABDOMINAL AORTOGRAM W/LOWER EXTREMITY;  Surgeon: Lorretta Harp, MD;  Location: [facility];  Service: Cardiovascular;  Laterality: N/A;   CARDIAC CATHETERIZATION     COLONOSCOPY  12 years ago   in [place] clinic= normal exam per pt   INTRAOPERATIVE TRANSTHORACIC ECHOCARDIOGRAM N/A 01/11/2022   Procedure: INTRAOPERATIVE TRANSTHORACIC ECHOCARDIOGRAM;  Surgeon: Sherren Mocha, MD;  Location: [facility];  Service: Open Heart Surgery;  Laterality: N/A;   KNEE SURGERY     MULTIPLE EXTRACTIONS WITH ALVEOLOPLASTY N/A 12/09/2021   Procedure: MULTIPLE EXTRACTION;  Surgeon: Charlaine Dalton, DMD;  Location: [facility];  Service: Dentistry;  Laterality: N/A;   PERIPHERAL VASCULAR INTERVENTION Right 08/08/2019   Procedure: PERIPHERAL VASCULAR  INTERVENTION;  Surgeon: Lorretta Harp, MD;  Location: [facility];  Service: Cardiovascular;  Laterality: Right;   PERIPHERAL VASCULAR INTERVENTION Left 11/18/2019   Procedure: PERIPHERAL VASCULAR INTERVENTION;  Surgeon: Lorretta Harp, MD;  Location: [facility];  Service: Cardiovascular;  Laterality: Left;  left popliteal  artery   RIGHT/LEFT HEART CATH AND CORONARY ANGIOGRAPHY N/A 10/29/2021   Procedure: RIGHT/LEFT HEART CATH AND CORONARY ANGIOGRAPHY;  Surgeon: Burnell Blanks, MD;  Location: [facility];  Service: Cardiovascular;  Laterality: N/A;   TRANSCAROTID ARTERY REVASCULARIZATION  Left 12/16/2019   Procedure: TRANSCAROTID ARTERY REVASCULARIZATION;  Surgeon: Elam Dutch, MD;  Location: [facility];  Service: Vascular;  Laterality: Left;   TRANSCAROTID ARTERY REVASCULARIZATION  Right 02/03/2020   Procedure: RIGHT TRANSCAROTID ARTERY REVASCULARIZATION;  Surgeon: Elam Dutch, MD;  Location: [facility];  Service: Vascular;  Laterality: Right;   TRANSCATHETER AORTIC VALVE REPLACEMENT, TRANSFEMORAL N/A 01/11/2022   Procedure: Transcatheter Aortic Valve Replacement, Transfemoral;  Surgeon: Sherren Mocha, MD;  Location: [facility];  Service: Open Heart Surgery;  Laterality: N/A;   ULTRASOUND GUIDANCE FOR VASCULAR ACCESS Right 12/16/2019   Procedure: ULTRASOUND GUIDANCE FOR VASCULAR ACCESS;  Surgeon: Elam Dutch, MD;  Location: [place] [facility];  Service: Vascular;  Laterality: Right;   ULTRASOUND GUIDANCE FOR VASCULAR ACCESS Right 02/03/2020   Procedure: ULTRASOUND GUIDANCE FOR VASCULAR ACCESS;  Surgeon: Elam Dutch, MD;  Location: [facility];  Service: Vascular;  Laterality: Right;   VASECTOMY     Social History:  reports that he has never smoked. He has never used smokeless tobacco. He reports current alcohol use. He reports that he does not use drugs. Allergies  Allergen Reactions   Codeine Nausea And Vomiting   Percocet [Oxycodone-Acetaminophen] Nausea And Vomiting   Family History  Problem Relation Age of Onset   CAD Father    Hypertension Father    Alcohol abuse Father        Cause of death   Diabetes Mother    Colon polyps Mother    CAD Brother 15       CABG   Colon cancer Neg Hx    Esophageal cancer Neg Hx    Rectal cancer Neg Hx    Stomach cancer Neg Hx    Prior to  Admission medications   Medication Sig Start Date End Date Taking? Authorizing Provider  amoxicillin-clavulanate (AUGMENTIN) 875-125 MG tablet Take 1 tablet by mouth every 12 (twelve) hours. Patient not taking: Reported on 05/19/2022 05/12/22   Domenic Moras, PA-C  aspirin EC 81 MG tablet Take 81 mg by mouth in the morning.    [provider]  atorvastatin (LIPITOR) 40 MG tablet Take 40 mg by mouth in the morning. 07/03/20   [provider]  carvedilol (COREG) 12.5 MG tablet Take 1 tablet (12.5 mg total) by mouth 2 (two) times daily with a meal. 10/30/21 12/28/23  Arrien, Jimmy Picket, MD  clopidogrel (PLAVIX) 75 MG tablet Take 1 tablet (75 mg total) by mouth daily. 05/13/22   Broadus John, MD  doxycycline (VIBRAMYCIN) 100 MG capsule Take 1 capsule (100 mg total) by mouth 2 (two) times daily. 05/12/22   Domenic Moras, PA-C  Empagliflozin-metFORMIN HCl ER (SYNJARDY XR) 25-1000 MG TB24 Take 1 tablet by mouth in the morning.    [provider]  ferrous sulfate 325 (65 FE) MG tablet Take 325 mg by mouth in the morning.    [provider]  furosemide (LASIX)  80 MG tablet Take 80 mg by mouth.    [provider]  gabapentin (NEURONTIN) 800 MG tablet Take 800 mg by mouth at bedtime as needed (Sleep). 07/03/20   [provider]  hydrALAZINE (APRESOLINE) 25 MG tablet Take 1 tablet (25 mg total) by mouth 3 (three) times daily. 01/13/22 01/13/23  Eileen Stanford, PA-C  insulin NPH-regular Human (70-30) 100 UNIT/ML injection Inject 8 Units into the skin daily with breakfast. 7 units with dinner    [provider]  isosorbide mononitrate (IMDUR) 30 MG 24 hr tablet Take 1 tablet (30 mg total) by mouth daily. 01/13/22 01/13/23  Eileen Stanford, PA-C  PRESCRIPTION MEDICATION Inject 0.1-1 mLs as directed See admin instructions. Tri-Mix Standard Strength 5 ml. Formula: Prostaglandin 10 mcg/ml, Papaverine 30 mg/ml, Phentolamine 1 mg/ml. Inject 0.1 ml into  side of penis as directed as needed (to be injected immediately before sexual intercourse) may increase the dose by 0.1 ml every 48 hours to achieve an erection. Max dose 1 ml.    [provider]  sacubitril-valsartan (ENTRESTO) 49-51 MG Take 1 tablet by mouth 2 (two) times daily. 03/07/22   Sabharwal, Aditya, DO  traMADol (ULTRAM) 50 MG tablet Take 1 tablet (50 mg total) by mouth every 8 (eight) hours as needed for up to 5 days. 05/26/22 05/31/22  Felipa Furnace, DPM  vitamin B-12 (CYANOCOBALAMIN) 500 MCG tablet Take 500 mcg by mouth in the morning. 08/24/21   [provider]   Physical Exam: Vitals:   05/27/22 1437 05/27/22 1451  BP: (!) 161/113   Pulse: 91   Resp: 17   Temp: 97.6 F (36.4 C)   TempSrc: Oral   SpO2: 100%   Weight:  80.3 kg  Height:  '5\' 7"'$  (1.702 m)     General: Alert, awake, oriented x3, NAD Eyes: pink conjunctiva, anicteric sclera, PERLA HEENT: normocephalic, atraumatic, oropharynx clear Neck: supple, no masses or lymphadenopathy, + JVD CVS: Regular rate and rhythm Resp : Bibasilar crackles GI : Soft, nontender, nondistended, positive bowel sounds. No hepatomegaly.  Ext: No lower extremity edema  Musculoskeletal: No clubbing or cyanosis, No contracture. ROM intact  Neuro: Grossly intact, no focal neurological deficits, strength 5/5 upper and lower extremities bilaterally Psych: alert and oriented x 3, normal mood and affect Skin: Right heel ulcer with eschar, no drainage     Data Reviewed: I have reviewed ED notes, Vitals, Lab results and outpatient records.   Recent Labs  Lab 05/27/22 1513  NA 133*  K 5.2*  CL 101  CO2 22  GLUCOSE 179*  BUN 53*  CREATININE 2.62*  CALCIUM 8.3*   Recent Labs  Lab 05/27/22 1513  WBC 8.1  NEUTROABS 6.4  HGB 14.3  HCT 45.7  MCV 84.5  PLT 292    Assessment and Plan Principal Problem:   Acute kidney injury superimposed on chronic kidney disease 3B (HCC) -Baseline creatinine 1.2-1.3, most  recently 1.8 on 05/12/2022.  On admission 2.62, BUN 53.   - Cr worsened acutely due to  NSAID's, nausea vomiting, intravascular depletion with dehydration, recently increased Lasix to 80 mg daily, did take his Lasix today this morning and Entresto -Will hold Lasix, Entresto, Neurontin.  Recommended patient to avoid NSAIDs. -Obtain renal ultrasound -Given elevated BNP, pulmonary edema on chest x-ray, consult cardiology with recommendation regarding his diuretics. -Patient reports seeing nephrology at [facility], obtain nephrology consult, discussed with Dr. Hollie Salk   Active Problems:   Acute on chronic systolic and diastolic CHF (  congestive heart failure) ([place]), history of CAD -2D echo 01/2022 had shown EF of 35 to 40%, G1 DD, stable TAVR -Elevated troponin likely due to demand ischemia, will trend troponins -Does have elevated BNP, +JVD, chest x-ray with pulmonary edema however creatinine currently elevated to 2.62, will consult cardiology recommendation regarding his diuretic dose.  Discussed with Dr. Harl Bowie (on-call cardiology).    Hypertensive urgency -BP elevated, will resume Coreg, hydralazine, Imdur -Placed on IV hydralazine as needed with parameters  Nausea vomiting, dehydration -Hold Lasix, Entresto, currently no ongoing vomiting -Placed on IV Zofran as needed, PPI   Hyperkalemia -Lokelma x 1    Peripheral vascular disease ([place]), carotid artery stenosis s/p bilateral TCAR -Continue Plavix, statin -Per patient, he is being worked up for upcoming vascular surgeries for PAD. -He has ABIs scheduled for 05/30/2022 and follow-up with Dr. Gwenlyn Found.    Type 2 diabetes mellitus with hyperlipidemia (HCC) -Hemoglobin A1c 7.9 on 04/25/2022 -Hold empagliflozin- metformin -Placed on moderate sliding scale insulin while inpatient    Hyperlipidemia -Continue Lipitor    Diabetic neuropathy (HCC) -Hold Neurontin due to AKI    Decubitus ulcer of right heel -Appears to have dry ulcer with eschar on the  right heel, no gangrene or drainage -Per patient he was taking excessive ibuprofen for the right heel ulcer and was been placed on tramadol by podiatry, Dr. Posey Pronto -Will notify podiatry     Severe AS status post TAVR in 10/23 -Most recent echo in 01/2022 showed stable TAVR  Advance Care Planning:   Code Status: Full Code, discussed with the patient Consults: Cardiology, nephrology Family Communication: Discussed with the patient, alert and oriented x 4 Severity of Illness:      The appropriate patient status for this patient is INPATIENT. Inpatient status is judged to be reasonable and necessary in order to provide the required intensity of service to ensure the patient's safety. The patient's presenting symptoms, physical exam findings, and initial radiographic and laboratory data in the context of their chronic comorbidities is felt to place them at high risk for further clinical deterioration. Furthermore, it is not anticipated that the patient will be medically stable for discharge from the hospital within 2 midnights of admission.   * I certify that at the point of admission it is my clinical judgment that the patient will require inpatient hospital care spanning beyond 2 midnights from the point of admission due to high intensity of service, high risk for further deterioration and high frequency of surveillance required.*    Author: Estill Cotta, MD 05/27/2022 5:49 PM For on call review [email or web address].

## 2022-05-27 NOTE — ED Provider Notes (Signed)
[place] EMERGENCY DEPARTMENT AT [facility] Provider Note   CSN: [id number] Arrival date & time: 05/27/22  1428     History  Chief Complaint  Patient presents with   Shortness of Breath    Caleb [name] is a 72 y.o. male.   Shortness of Breath Patient presents with shortness of breath and nausea vomiting.  States shortness of breath has been for the last few days although reportedly told triage nurse over the last month.  Had nausea and vomiting today.  States he thinks it could be due his medicine.  States he is started tramadol and has been taking a lot amount of ibuprofen.  Has had a right heel wound has been seeing podiatry for.  Also due to see cardiology for vascular evaluation of the leg.  No fevers.  No known sick contacts.  States he feels much better now.    Past Medical History:  Diagnosis Date   Carotid artery occlusion    Diabetes mellitus without complication ([place])    Type II   Dyslipidemia 09/27/2012   Erectile dysfunction 09/27/2012   GERD (gastroesophageal reflux disease)    Hypercholesteremia    Hyperlipidemia 08/12/2012   Hypertension    Hyponatremia 08/14/2012   Neuropathy    Pancreatitis    Pancreatitis, acute 09/27/2012   Peripheral vascular disease ([place])    S/P 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 Burt Knack & Bartle   Severe aortic stenosis    Vitamin D deficiency 06/23/2015    Home Medications Prior to Admission medications   Medication Sig Start Date End Date Taking? Authorizing Provider  amoxicillin-clavulanate (AUGMENTIN) 875-125 MG tablet Take 1 tablet by mouth every 12 (twelve) hours. Patient not taking: Reported on 05/19/2022 05/12/22   Domenic Moras, PA-C  aspirin EC 81 MG tablet Take 81 mg by mouth in the morning.    [provider]  atorvastatin (LIPITOR) 40 MG tablet Take 40 mg by mouth in the morning. 07/03/20   [provider]  carvedilol (COREG) 12.5 MG tablet Take 1 tablet (12.5 mg total) by mouth 2 (two) times daily with a meal. 10/30/21 12/28/23  Arrien, Jimmy Picket, MD  clopidogrel (PLAVIX) 75 MG tablet Take 1 tablet (75 mg total) by mouth daily. 05/13/22   Broadus John, MD  doxycycline (VIBRAMYCIN) 100 MG capsule Take 1 capsule (100 mg total) by mouth 2 (two) times daily. 05/12/22   Domenic Moras, PA-C  Empagliflozin-metFORMIN HCl ER (SYNJARDY XR) 25-1000 MG TB24 Take 1 tablet by mouth in the morning.    [provider]  ferrous sulfate 325 (65 FE) MG tablet Take 325 mg by mouth in the morning.    [provider]  furosemide (LASIX) 80 MG tablet Take 80 mg by mouth.    [provider]  gabapentin (NEURONTIN) 800 MG tablet Take 800 mg by mouth at bedtime as needed (Sleep). 07/03/20   [provider]  hydrALAZINE (APRESOLINE) 25 MG tablet Take 1 tablet (25 mg total) by mouth 3 (three) times daily. 01/13/22 01/13/23  Eileen Stanford, PA-C  insulin NPH-regular Human (70-30) 100 UNIT/ML injection Inject 8 Units into the skin daily with breakfast. 7 units with dinner    [provider]  isosorbide mononitrate (IMDUR) 30 MG 24 hr tablet Take 1 tablet (30 mg total) by mouth daily. 01/13/22 01/13/23  Eileen Stanford, PA-C  PRESCRIPTION MEDICATION Inject  0.1-1 mLs as directed See admin instructions. Tri-Mix Standard Strength 5 ml. Formula: Prostaglandin 10 mcg/ml, Papaverine 30 mg/ml, Phentolamine 1 mg/ml. Inject 0.1 ml into side of penis as directed as needed (to be injected immediately before sexual intercourse) may increase the dose by 0.1 ml every 48 hours to achieve an erection. Max dose 1 ml.    [provider]  sacubitril-valsartan (ENTRESTO) 49-51 MG Take 1 tablet by mouth 2 (two) times daily. 03/07/22   Sabharwal, Aditya, DO  traMADol (ULTRAM) 50 MG tablet Take 1 tablet (50 mg total) by mouth every 8 (eight) hours as needed for up to 5 days. 05/26/22 05/31/22   Felipa Furnace, DPM  vitamin B-12 (CYANOCOBALAMIN) 500 MCG tablet Take 500 mcg by mouth in the morning. 08/24/21   [provider]      Allergies    Codeine and Percocet [oxycodone-acetaminophen]    Review of Systems   Review of Systems  Respiratory:  Positive for shortness of breath.     Physical Exam Updated Vital Signs BP (!) 161/113 (BP Location: Right Arm)   Pulse 91   Temp 97.6 F (36.4 C) (Oral)   Resp 17   Ht '5\' 7"'$  (1.702 m)   Wt 80.3 kg   SpO2 100%   BMI 27.72 kg/m  Physical Exam Vitals and nursing note reviewed.  Cardiovascular:     Rate and Rhythm: Regular rhythm.  Pulmonary:     Breath sounds: No wheezing, rhonchi or rales.  Chest:     Chest wall: No tenderness.  Abdominal:     Tenderness: There is no abdominal tenderness.  Musculoskeletal:     Cervical back: Neck supple.     Comments: Eschar to right lateral posterior heel.  No erythema.  Skin:    Capillary Refill: Capillary refill takes less than 2 seconds.  Neurological:     Mental Status: He is alert and oriented to person, place, and time.     ED Results / Procedures / Treatments   Labs (all labs ordered are listed, but only abnormal results are displayed) Labs Reviewed  COMPREHENSIVE METABOLIC PANEL - Abnormal; Notable for the following components:      Result Value   Sodium 133 (*)    Potassium 5.2 (*)    Glucose, Bld 179 (*)    BUN 53 (*)    Creatinine, Ser 2.62 (*)    Calcium 8.3 (*)    Albumin 3.4 (*)    AST 50 (*)    ALT 57 (*)    Alkaline Phosphatase 165 (*)    GFR, Estimated 25 (*)    All other components within normal limits  BRAIN NATRIURETIC PEPTIDE - Abnormal; Notable for the following components:   B Natriuretic Peptide >4,500.0 (*)    All other components within normal limits  TROPONIN I (HIGH SENSITIVITY) - Abnormal; Notable for the following components:   Troponin I (High Sensitivity) 59 (*)    All other components within normal limits  RESP PANEL BY RT-PCR  (RSV, FLU A&B, COVID)  [id number]  CBC WITH DIFFERENTIAL/PLATELET  TROPONIN I (HIGH SENSITIVITY)    EKG EKG Interpretation  Date/Time:  Friday May 27 2022 15:05:26 EST Ventricular Rate:  88 PR Interval:  196 QRS Duration: 109 QT Interval:  403 QTC Calculation: 488 R Axis:   79 Text Interpretation: Sinus rhythm Multiple ventricular premature complexes Minimal ST depression, anterolateral leads Borderline prolonged QT interval Confirmed by Davonna Belling [phone]) on 05/27/2022 4:10:20 PM  Radiology DG Chest  2 View  Result Date: 05/27/2022 CLINICAL DATA:  Shortness of breath EXAM: CHEST - 2 VIEW COMPARISON:  01/07/2022 FINDINGS: Mild cardiomegaly. TAVR. Pulmonary vascular congestion with mild diffuse interstitial prominence. No pleural effusion. No pneumothorax. IMPRESSION: Mild cardiomegaly with pulmonary vascular congestion and mild diffuse interstitial prominence, suggesting CHF with mild interstitial edema. Electronically Signed   By: Davina Poke D.O.   On: 05/27/2022 16:02    Procedures Procedures    Medications Ordered in ED Medications - No data to display  ED Course/ Medical Decision Making/ A&P                             Medical Decision Making Amount and/or Complexity of Data Reviewed Labs: ordered. Radiology: ordered.   Patient with shortness of breath nausea vomiting.  Shortness of breath last few days but nausea vomiting today.  States began after taking some tramadol and ibuprofen.  States more ibuprofen than he was supposed to.  Will get x-ray to evaluate for cause of shortness of breath.  Will get basic blood work to evaluate for causes of nausea and vomiting.  Blood work shows an acute kidney injury.  Also likely CHF.  X-ray shows volume overload.  Independently interpreted by me.  However BNP is also very elevated.  Has had increasing Lasix for the last 2 weeks.  With worsening CHF and increasing kidney function despite outpatient Lasix I think he would  benefit from admission to the hospital for further workup.  Workup for the nausea vomiting is overall reassuring and I think likely is due to the medication and less likely dehydration from that causing the acute kidney injury.  Will discuss with hospitalist for admission.        Final Clinical Impression(s) / ED Diagnoses Final diagnoses:  AKI (acute kidney injury) ([place])  Congestive heart failure, unspecified HF chronicity, unspecified heart failure type [facility])    Rx / DC Orders ED Discharge Orders     None         Davonna Belling, MD 05/27/22 1701

## 2022-05-27 NOTE — ED Triage Notes (Addendum)
Here by POV for sob, nausea and vomiting x3. SOB onset increasingly over the last month. NV onset this am. Mentions R heel ulcer/ wound. Denies fever, pain, diarrhea. Denies CP. Pedal edema noted, R>L. Alert, NAD, calm, interactive, speaking in clear shortened phrases. Pt of the [place].

## 2022-05-28 ENCOUNTER — Inpatient Hospital Stay (HOSPITAL_COMMUNITY): Payer: No Typology Code available for payment source

## 2022-05-28 DIAGNOSIS — L89619 Pressure ulcer of right heel, unspecified stage: Secondary | ICD-10-CM

## 2022-05-28 DIAGNOSIS — I509 Heart failure, unspecified: Secondary | ICD-10-CM

## 2022-05-28 DIAGNOSIS — R0609 Other forms of dyspnea: Secondary | ICD-10-CM | POA: Diagnosis not present

## 2022-05-28 DIAGNOSIS — I160 Hypertensive urgency: Secondary | ICD-10-CM | POA: Diagnosis not present

## 2022-05-28 DIAGNOSIS — N189 Chronic kidney disease, unspecified: Secondary | ICD-10-CM

## 2022-05-28 DIAGNOSIS — N179 Acute kidney failure, unspecified: Secondary | ICD-10-CM | POA: Diagnosis not present

## 2022-05-28 DIAGNOSIS — I5023 Acute on chronic systolic (congestive) heart failure: Secondary | ICD-10-CM | POA: Diagnosis not present

## 2022-05-28 LAB — ECHOCARDIOGRAM COMPLETE
AR max vel: 1.02 cm2
AV Area VTI: 1.03 cm2
AV Area mean vel: 1.02 cm2
AV Mean grad: 8.5 mmHg
AV Peak grad: 16.9 mmHg
Ao pk vel: 2.06 m/s
Area-P 1/2: 5.66 cm2
Calc EF: 26.8 %
Height: 67 in
MV VTI: 1.42 cm2
S' Lateral: 4.4 cm
Single Plane A2C EF: 25.9 %
Single Plane A4C EF: 16 %
Weight: 2959.46 oz

## 2022-05-28 LAB — CBC
HCT: 37.6 % — ABNORMAL LOW (ref 39.0–52.0)
Hemoglobin: 11.9 g/dL — ABNORMAL LOW (ref 13.0–17.0)
MCH: 26.1 pg (ref 26.0–34.0)
MCHC: 31.6 g/dL (ref 30.0–36.0)
MCV: 82.5 fL (ref 80.0–100.0)
Platelets: 283 10*3/uL (ref 150–400)
RBC: 4.56 MIL/uL (ref 4.22–5.81)
RDW: 15.3 % (ref 11.5–15.5)
WBC: 7.5 10*3/uL (ref 4.0–10.5)
nRBC: 0 % (ref 0.0–0.2)

## 2022-05-28 LAB — URINALYSIS, ROUTINE W REFLEX MICROSCOPIC
Bacteria, UA: NONE SEEN
Bilirubin Urine: NEGATIVE
Glucose, UA: >=500 mg/dL — AB
Hgb urine dipstick: NEGATIVE
Ketones, ur: 5 mg/dL — AB
Leukocytes,Ua: NEGATIVE
Nitrite: NEGATIVE
Protein, ur: 30 mg/dL — AB
Specific Gravity, Urine: 1.014 (ref 1.005–1.030)
pH: 5 (ref 5.0–8.0)

## 2022-05-28 LAB — BASIC METABOLIC PANEL
Anion gap: 10 (ref 5–15)
BUN: 62 mg/dL — ABNORMAL HIGH (ref 8–23)
CO2: 23 mmol/L (ref 22–32)
Calcium: 8 mg/dL — ABNORMAL LOW (ref 8.9–10.3)
Chloride: 98 mmol/L (ref 98–111)
Creatinine, Ser: 2.77 mg/dL — ABNORMAL HIGH (ref 0.61–1.24)
GFR, Estimated: 24 mL/min — ABNORMAL LOW (ref >60)
Glucose, Bld: 163 mg/dL — ABNORMAL HIGH (ref 70–99)
Potassium: 4.6 mmol/L (ref 3.5–5.1)
Sodium: 131 mmol/L — ABNORMAL LOW (ref 135–145)

## 2022-05-28 LAB — HEPATIC FUNCTION PANEL
ALT: 39 U/L (ref 0–44)
ALT: 39 U/L (ref 0–44)
AST: 28 U/L (ref 15–41)
AST: 36 U/L (ref 15–41)
Albumin: 2.8 g/dL — ABNORMAL LOW (ref 3.5–5.0)
Albumin: 3 g/dL — ABNORMAL LOW (ref 3.5–5.0)
Alkaline Phosphatase: 131 U/L — ABNORMAL HIGH (ref 38–126)
Alkaline Phosphatase: 135 U/L — ABNORMAL HIGH (ref 38–126)
Bilirubin, Direct: <0.1 mg/dL (ref 0.0–0.2)
Bilirubin, Direct: <0.1 mg/dL (ref 0.0–0.2)
Total Bilirubin: 0.7 mg/dL (ref 0.3–1.2)
Total Bilirubin: 0.6 mg/dL (ref 0.3–1.2)
Total Protein: 6.2 g/dL — ABNORMAL LOW (ref 6.5–8.1)
Total Protein: 6.7 g/dL (ref 6.5–8.1)

## 2022-05-28 LAB — GLUCOSE, CAPILLARY
Glucose-Capillary: 167 mg/dL — ABNORMAL HIGH (ref 70–99)
Glucose-Capillary: 211 mg/dL — ABNORMAL HIGH (ref 70–99)
Glucose-Capillary: 171 mg/dL — ABNORMAL HIGH (ref 70–99)
Glucose-Capillary: 158 mg/dL — ABNORMAL HIGH (ref 70–99)

## 2022-05-28 LAB — PROTEIN / CREATININE RATIO, URINE
Creatinine, Urine: 91 mg/dL
Protein Creatinine Ratio: 0.62 mg/mg{Cre} — ABNORMAL HIGH (ref 0.00–0.15)
Total Protein, Urine: 56 mg/dL

## 2022-05-28 LAB — HEPATITIS PANEL, ACUTE
HCV Ab: NONREACTIVE
Hep A IgM: NONREACTIVE
Hep B C IgM: NONREACTIVE
Hepatitis B Surface Ag: NONREACTIVE

## 2022-05-28 MED ORDER — ATORVASTATIN CALCIUM 40 MG PO TABS
40.0000 mg | ORAL_TABLET | Freq: Every day | ORAL | Status: DC
  Administered 2022-05-29 – 2022-06-01 (×4): 40 mg via ORAL
  Filled 2022-05-28 (×4): qty 1

## 2022-05-28 MED ORDER — POLYETHYLENE GLYCOL 3350 17 G PO PACK
17.0000 g | PACK | Freq: Every day | ORAL | Status: DC
  Administered 2022-05-28 – 2022-05-31 (×4): 17 g via ORAL
  Filled 2022-05-28 (×5): qty 1

## 2022-05-28 MED ORDER — FUROSEMIDE 10 MG/ML IJ SOLN
60.0000 mg | Freq: Once | INTRAMUSCULAR | Status: AC
  Administered 2022-05-28: 60 mg via INTRAVENOUS
  Filled 2022-05-28: qty 6

## 2022-05-28 MED ORDER — INSULIN GLARGINE-YFGN 100 UNIT/ML ~~LOC~~ SOLN
10.0000 [IU] | Freq: Every day | SUBCUTANEOUS | Status: DC
  Administered 2022-05-28 – 2022-06-01 (×5): 10 [IU] via SUBCUTANEOUS
  Filled 2022-05-28 (×5): qty 0.1

## 2022-05-28 MED ORDER — INSULIN ASPART 100 UNIT/ML IJ SOLN
3.0000 [IU] | Freq: Three times a day (TID) | INTRAMUSCULAR | Status: DC
  Administered 2022-05-28 – 2022-06-01 (×12): 3 [IU] via SUBCUTANEOUS

## 2022-05-28 MED ORDER — FUROSEMIDE 10 MG/ML IJ SOLN: 80.0000 mg | Freq: Three times a day (TID) | INTRAMUSCULAR | Status: DC

## 2022-05-28 MED ORDER — FUROSEMIDE 10 MG/ML IJ SOLN
80.0000 mg | Freq: Two times a day (BID) | INTRAMUSCULAR | Status: DC
  Administered 2022-05-28 – 2022-06-01 (×8): 80 mg via INTRAVENOUS
  Filled 2022-05-28 (×8): qty 8

## 2022-05-28 NOTE — Consult Note (Signed)
Cardiology Consultation   Patient ID: Caleb [name] MRN: [id number]; DOB: 05-20-1950  Admit date: 05/27/2022 Date of Consult: 05/28/2022  PCP:  [name], [place] Prov[facility] Cardiologist:  Quay Burow, MD   {      Patient Profile:   Caleb [name] is a 72 y.o. male with a hx of  HTN, DM2, CKD 3A, PAD with prior bilateral popliteal stenting, carotid stenosis s/p bilateral TCAR, chronic HFrEF LVEF 20%/NICM, severe AS s/p TAVR 01/11/22 who is being seen 05/28/2022 for the evaluation of SOB at the request of Dr Tana Coast.  History of Present Illness:   [name] 72 yo male history of HTN, DM2, CKD 3A, PAD with prior bilateral popliteal stenting, carotid stenosis s/p bilateral TCAR, chronic HFrEF LVEF 20%/NICM, severe AS s/p TAVR 01/11/22 admitted with SOB, N/V. Admitted with AKI, managed by neprhology. Cardiology consulted to help manage volume overload in setting of AKI.    ER vitals: p 91 bp 161/113 100% RA  K 5.2 BUN 53 Cr 2.6 (up from 1.4 2 months ago, 1.8 2 weeks ago) WBC 8.1 Hgb 14.3 Plt 292 BNP >4500 (up from 728 2 months ago)  Trop 59-->65 CXR +mild edema EKG SR, no acute ischemic changes Echo pending Renal US no acute process  01/2022 echo: LVEF 35-40%, global hypokinesis, grade I dd, normal RV, AVR no significant stenosis mild PVL 10/2021 cath: mild to mod nonobstructive CAD. Mean PA 38, PCWP 19 CI inaccurate        Past Medical History:  Diagnosis Date   Carotid artery occlusion    Diabetes mellitus without complication ([place])    Type II   Dyslipidemia 09/27/2012   Erectile dysfunction 09/27/2012   GERD (gastroesophageal reflux disease)    Hypercholesteremia    Hyperlipidemia 08/12/2012   Hypertension    Hyponatremia 08/14/2012   Neuropathy    Pancreatitis    Pancreatitis, acute 09/27/2012   Peripheral vascular disease ([place])    S/P TAVR (transcatheter aortic valve replacement) 01/11/2022   s/p TAVR with a 26 mm Edwards S3UR via the  TF approach by Dr. Burt Knack & Bartle   Severe aortic stenosis    Vitamin D deficiency 06/23/2015    Past Surgical History:  Procedure Laterality Date   ABDOMINAL AORTOGRAM W/LOWER EXTREMITY N/A 08/08/2019   Procedure: ABDOMINAL AORTOGRAM W/LOWER EXTREMITY;  Surgeon: Lorretta Harp, MD;  Location: [facility];  Service: Cardiovascular;  Laterality: N/A;   ABDOMINAL AORTOGRAM W/LOWER EXTREMITY N/A 11/18/2019   Procedure: ABDOMINAL AORTOGRAM W/LOWER EXTREMITY;  Surgeon: Lorretta Harp, MD;  Location: [facility];  Service: Cardiovascular;  Laterality: N/A;   CARDIAC CATHETERIZATION     COLONOSCOPY  12 years ago   in [place] clinic= normal exam per pt   INTRAOPERATIVE TRANSTHORACIC ECHOCARDIOGRAM N/A 01/11/2022   Procedure: INTRAOPERATIVE TRANSTHORACIC ECHOCARDIOGRAM;  Surgeon: Sherren Mocha, MD;  Location: [facility];  Service: Open Heart Surgery;  Laterality: N/A;   KNEE SURGERY     MULTIPLE EXTRACTIONS WITH ALVEOLOPLASTY N/A 12/09/2021   Procedure: MULTIPLE EXTRACTION;  Surgeon: Charlaine Dalton, DMD;  Location: [facility];  Service: Dentistry;  Laterality: N/A;   PERIPHERAL VASCULAR INTERVENTION Right 08/08/2019   Procedure: PERIPHERAL VASCULAR INTERVENTION;  Surgeon: Lorretta Harp, MD;  Location: [facility];  Service: Cardiovascular;  Laterality: Right;   PERIPHERAL VASCULAR INTERVENTION Left 11/18/2019   Procedure: PERIPHERAL VASCULAR INTERVENTION;  Surgeon: Lorretta Harp, MD;  Location: [facility];  Service: Cardiovascular;  Laterality: Left;  left popliteal artery   RIGHT/LEFT HEART CATH AND CORONARY ANGIOGRAPHY N/A 10/29/2021   Procedure: RIGHT/LEFT HEART CATH AND CORONARY ANGIOGRAPHY;  Surgeon: Burnell Blanks, MD;  Location: [facility];  Service: Cardiovascular;  Laterality: N/A;   TRANSCAROTID ARTERY REVASCULARIZATION  Left 12/16/2019   Procedure: TRANSCAROTID ARTERY REVASCULARIZATION;  Surgeon: Elam Dutch, MD;   Location: [facility];  Service: Vascular;  Laterality: Left;   TRANSCAROTID ARTERY REVASCULARIZATION  Right 02/03/2020   Procedure: RIGHT TRANSCAROTID ARTERY REVASCULARIZATION;  Surgeon: Elam Dutch, MD;  Location: [facility];  Service: Vascular;  Laterality: Right;   TRANSCATHETER AORTIC VALVE REPLACEMENT, TRANSFEMORAL N/A 01/11/2022   Procedure: Transcatheter Aortic Valve Replacement, Transfemoral;  Surgeon: Sherren Mocha, MD;  Location: [facility];  Service: Open Heart Surgery;  Laterality: N/A;   ULTRASOUND GUIDANCE FOR VASCULAR ACCESS Right 12/16/2019   Procedure: ULTRASOUND GUIDANCE FOR VASCULAR ACCESS;  Surgeon: Elam Dutch, MD;  Location: [facility];  Service: Vascular;  Laterality: Right;   ULTRASOUND GUIDANCE FOR VASCULAR ACCESS Right 02/03/2020   Procedure: ULTRASOUND GUIDANCE FOR VASCULAR ACCESS;  Surgeon: Elam Dutch, MD;  Location: [facility];  Service: Vascular;  Laterality: Right;   VASECTOMY       Inpatient Medications: Scheduled Meds:  aspirin EC  81 mg Oral Daily   carvedilol  12.5 mg Oral BID WC   clopidogrel  75 mg Oral Daily   enoxaparin (LOVENOX) injection  30 mg Subcutaneous Q24H   hydrALAZINE  25 mg Oral TID   insulin aspart  0-15 Units Subcutaneous TID WC   insulin aspart  0-5 Units Subcutaneous QHS   isosorbide mononitrate  30 mg Oral Daily   pantoprazole  40 mg Oral Q0600   polyethylene glycol  17 g Oral Daily   sodium chloride flush  3 mL Intravenous Q12H   cyanocobalamin  500 mcg Oral Daily   Continuous Infusions:  sodium chloride     PRN Meds: sodium chloride, acetaminophen, fentaNYL (SUBLIMAZE) injection, hydrALAZINE, ondansetron (ZOFRAN) IV, mouth rinse, sodium chloride flush  Allergies:    Allergies  Allergen Reactions   Codeine Nausea And Vomiting   Percocet [Oxycodone-Acetaminophen] Nausea And Vomiting   Tramadol Hcl Nausea And Vomiting    Social History:   Social History   Socioeconomic History   Marital status: Widowed     Spouse name: Not on file   Number of children: 3   Years of education: Not on file   Highest education level: High school graduate  Occupational History   Occupation: Caregiver    Comment: Special needs    Comment: retired  Tobacco Use   Smoking status: Never   Smokeless tobacco: Never  Vaping Use   Vaping Use: Never used  Substance and Sexual Activity   Alcohol use: Yes    Comment: occasional   Drug use: No   Sexual activity: Not Currently  Other Topics Concern   Not on file  Social History Narrative   Widower.  3 daughters and 3 grandchildren.     Social Determinants of Health   Financial Resource Strain: Low Risk  (10/29/2021)   Overall Financial Resource Strain (CARDIA)    Difficulty of Paying Living Expenses: Not hard at all  Food Insecurity: No Food Insecurity (05/27/2022)   Hunger Vital Sign    Worried About Running Out of Food in the Last Year: Never true    Ran Out of Food in the Last Year: Never  true  Transportation Needs: No Transportation Needs (05/27/2022)   PRAPARE - Hydrologist (Medical): No    Lack of Transportation (Non-Medical): No  Physical Activity: Sufficiently Active (12/06/2020)   Exercise Vital Sign    Days of Exercise per Week: 4 days    Minutes of Exercise per Session: 60 min  Stress: No Stress Concern Present (12/06/2020)   [place]    Feeling of Stress : Not at all  Social Connections: Moderately Isolated (12/06/2020)   Social Connection and Isolation Panel [NHANES]    Frequency of Communication with Friends and Family: More than three times a week    Frequency of Social Gatherings with Friends and Family: More than three times a week    Attends Religious Services: More than 4 times per year    Active Member of [employer] or Organizations: No    Attends Archivist Meetings: Never    Marital Status: Widowed  Intimate Partner Violence: Not At Risk  (05/27/2022)   Humiliation, Afraid, Rape, and Kick questionnaire    Fear of Current or Ex-Partner: No    Emotionally Abused: No    Physically Abused: No    Sexually Abused: No    Family History:    Family History  Problem Relation Age of Onset   CAD Father    Hypertension Father    Alcohol abuse Father        Cause of death   Diabetes Mother    Colon polyps Mother    CAD Brother 68       CABG   Colon cancer Neg Hx    Esophageal cancer Neg Hx    Rectal cancer Neg Hx    Stomach cancer Neg Hx      ROS:  Please see the history of present illness.   All other ROS reviewed and negative.     Physical Exam/Data:   Vitals:   05/27/22 2055 05/28/22 0033 05/28/22 0617 05/28/22 0949  BP: (!) 147/108 127/84 (!) 122/92 93/69  Pulse: 100 88 86 81  Resp: '20 19 18 19  '$ Temp: 98.3 F (36.8 C) 97.7 F (36.5 C) 98.3 F (36.8 C) 98.9 F (37.2 C)  TempSrc: Oral Oral Oral Oral  SpO2: 99% 98% 97% 97%  Weight:   83.9 kg   Height:        Intake/Output Summary (Last 24 hours) at 05/28/2022 1004 Last data filed at 05/28/2022 0842 Gross per 24 hour  Intake 363 ml  Output 400 ml  Net -37 ml      05/28/2022    6:17 AM 05/27/2022    2:51 PM 05/19/2022    8:08 AM  Last 3 Weights  Weight (lbs) 184 lb 15.5 oz 177 lb 180 lb 9.6 oz  Weight (kg) 83.9 kg 80.287 kg 81.92 kg     Body mass index is 28.97 kg/m.  General:  Well nourished, well developed, in no acute distress HEENT: normal Neck: elevated JVD Vascular: No carotid bruits; Distal pulses 2+ bilaterally Cardiac:  normal S1, S2; RRR; no murmur  Lungs:  crackles bilateral bases Abd: soft, nontender, no hepatomegaly  Ext: no edema Musculoskeletal:  No deformities, BUE and BLE strength normal and equal Skin: warm and dry  Neuro:  CNs 2-12 intact, no focal abnormalities noted Psych:  Normal affect       Laboratory Data:  High Sensitivity Troponin:   Recent Labs  Lab 05/27/22 1513 05/27/22  [place]  Lab 06/11/22 1513 06/11/2022 1900 05/28/22 0429  NA 133*  --  131*  K 5.2*  --  4.6  CL 101  --  98  CO2 22  --  23  GLUCOSE 179*  --  163*  BUN 53*  --  62*  CREATININE 2.62* 2.66* 2.77*  CALCIUM 8.3*  --  8.0*  GFRNONAA 25* 25* 24*  ANIONGAP 10  --  10    Recent Labs  Lab 2022-06-11 1513 05/28/22 0429  PROT 7.8 6.2*  ALBUMIN 3.4* 2.8*  AST 50* 28  ALT 57* 39  ALKPHOS 165* 131*  BILITOT 0.9 0.7   Lipids No results for input(s): "CHOL", "TRIG", "HDL", "LABVLDL", "LDLCALC", "CHOLHDL" in the last 168 hours.  Hematology Recent Labs  Lab 06/11/22 1513 06/11/2022 2118 05/28/22 0429  WBC 8.1 8.4 7.5  RBC 5.41 5.31 4.56  HGB 14.3 14.1 11.9*  HCT 45.7 44.8 37.6*  MCV 84.5 84.4 82.5  MCH 26.4 26.6 26.1  MCHC 31.3 31.5 31.6  RDW 15.5 15.5 15.3  PLT 292 313 283   Thyroid No results for input(s): "TSH", "FREET4" in the last 168 hours.  BNP Recent Labs  Lab June 11, 2022 1513  BNP >4,500.0*    DDimer No results for input(s): "DDIMER" in the last 168 hours.   Radiology/Studies:  US RENAL  Result Date: 06-11-22 CLINICAL DATA:  Acute kidney injury EXAM: RENAL / URINARY TRACT ULTRASOUND COMPLETE COMPARISON:  CT 11/16/2021 FINDINGS: Right Kidney: Renal measurements: 10.1 x 5.1 x 5.6 cm = volume: 149 mL. Echogenicity within normal limits. No mass or hydronephrosis visualized. Left Kidney: Renal measurements: 10.9 x 5.8 x 5.5 cm = volume: 183 mL. Echogenicity within normal limits. No mass or hydronephrosis visualized. Bladder: Appears normal for degree of bladder distention. Other: None. IMPRESSION: Unremarkable sonographic appearance of the kidneys. Electronically Signed   By: Davina Poke D.O.   On: 2022-06-11 18:33   DG Chest 2 View  Result Date: 06/11/2022 CLINICAL DATA:  Shortness of breath EXAM: CHEST - 2 VIEW COMPARISON:  01/07/2022 FINDINGS: Mild cardiomegaly. TAVR. Pulmonary vascular congestion with mild diffuse interstitial prominence. No pleural  effusion. No pneumothorax. IMPRESSION: Mild cardiomegaly with pulmonary vascular congestion and mild diffuse interstitial prominence, suggesting CHF with mild interstitial edema. Electronically Signed   By: Davina Poke D.O.   On: 06-11-2022 16:02     Assessment and Plan:   1.Acute on chronic HFrEF - 03/2021 echo: LVEF 35-40%, global hypokinesis, grade I dd, normal RV, AVR no significant stenosis mild PVL - 10/2021 cath: mild to mod nonobstructive CAD. Mean PA 38, PCWP 19 CI inaccurate  - CXR +pulm edema, BNP >4500. Wt 184 lbs, 182 lbs suring 02/2022 clinic visit  - he is volume overloaded by exam, xray and markedly elevated BNP. Presented with AKI making diuresis more difficult. Will touch base with renal if ok for IV lasix today.  - repeat echo given progression of HF symptoms.   - medical therapy with coreg 12.'5mg'$  bid, hydral '25mg'$  tid, imdur 30. Entresot on hold given AKI   2. AKI on CKD - Cr 2 months ago 1.4, 2 weeks ago 1.8. On admit 2.6, today 2.77 -taking NSAIDs at home. Also on entresto - renal US negative.  - ongoing management per renal   3 . History of TAVR - repeat echo      For questions or updates, please contact [place] Please consult  [email or web address] for contact info under    Signed, Carlyle Dolly, MD  05/28/2022 10:04 AM

## 2022-05-28 NOTE — Plan of Care (Signed)

## 2022-05-28 NOTE — Progress Notes (Signed)
[facility]                                                                              Caleb [name], is a 72 y.o. male, DOB - 21-Oct-1950, [id number] Admit date - 05/27/2022    Outpatient Primary MD for the patient is [name], MD  LOS - 1  days  Chief Complaint  Patient presents with   Shortness of Breath       Brief summary   ERNAN [name] is a 72 y.o. male with PMH significant of chronic systolic CHF, EF 35 to [id number], G1 DD per 2D echo on 02/16/2022, diabetes mellitus type 2, GERD, HTN, HLP, diabetic neuropathy, aortic stenosis, TAVR in 12/2021, CKD stage IIIb presented to ED with shortness of breath, nausea and vomiting.  Patient reported that shortness of breath has been going on for at least a week, no chest pain, no significant peripheral edema.  This morning he woke up and started having nausea and vomiting with 4 episodes of emesis.  No hematemesis.  No abdominal pain or diarrhea.  He has right heel ulcer and has been seeing podiatry.  Patient reported that he had started tramadol and also has been taking a large amount of ibuprofen for the pain in the last week. Of note, patient was seen in cardiology office on 05/19/2022 and had recently increased his Lasix to 80 mg daily.   ED course:  In ED, temp 97.6, respiratory rate 17, pulse 91, BP 161/113, O2 sats 100% on room air Sodium 133, potassium 5.2, CO2 22, BUN 53, creatinine 2.62.  Creatinine was 1.8 on 05/12/2022.  Baseline creatinine 1.2-1.3   Assessment & Plan   Principal Problem:   Acute kidney injury superimposed on chronic kidney disease 3B ([place]) -Baseline creatinine 1.2-1.3, most recently 1.8 on 05/12/2022.  On admission 2.62, BUN 53.   - Cr worsened acutely due to  NSAID's, nausea vomiting, intravascular depletion with dehydration, recently increased Lasix to 80 mg daily, and Entresto -Will hold Lasix, Entresto, Neurontin.  Recommended patient to avoid NSAIDs. -Renal ultrasound normal, no  hydronephrosis.   -Creatinine still elevated 2.7, nephrology following     Active Problems:   Acute on chronic systolic and diastolic CHF (congestive heart failure) ([place]), history of CAD -2D echo 01/2022 had shown EF of 35 to 40%, G1 DD, stable TAVR -Chest x-ray with pulmonary edema, elevated BNP > 4500, JVD -Cardiology consulted, will defer to cardiology and renal regarding IV diuresis as creatinine is still trending up -Repeat 2D echo     Hypertensive urgency -BP now soft, currently on Coreg, hydralazine, Imdur  -Lasix, Entresto on hold   Nausea vomiting, dehydration -Hold Lasix, Entresto, currently no ongoing vomiting -Placed on IV Zofran as needed, PPI    Hyperkalemia Resolved     Peripheral vascular disease ([place]), carotid artery stenosis s/p bilateral TCAR -Continue Plavix, statin -Per patient, he is being worked up for upcoming vascular surgeries for PAD. -He has ABIs scheduled for 05/30/2022 and follow-up with Dr. Gwenlyn Found.     Type 2 diabetes mellitus with hyperlipidemia ([facility]) -Hemoglobin A1c 7.9 on 04/25/2022 -Hold empagliflozin- metformin -Continue moderate  SSI, and Semglee 10 units daily, add meal coverage 3 units 3 times daily AC CBG (last 3)  Recent Labs    05/27/22 2116 05/28/22 0753 05/28/22 1151  GLUCAP 200* 167* 211*        Hyperlipidemia -Continue Lipitor     Diabetic neuropathy ([facility]) -Hold Neurontin due to AKI     Decubitus ulcer of right heel -Appears to have dry ulcer with eschar on the right heel, no gangrene or drainage -Per patient he was taking excessive ibuprofen for the right heel ulcer and was been placed on tramadol by podiatry, Dr. Posey Pronto -notified podiatry, Dr Loel Lofty on call.    Severe AS status post TAVR in 10/23 -Most recent echo in 01/2022 showed stable TAVR  Estimated body mass index is 28.97 kg/m as calculated from the following:   Height as of this encounter: '5\' 7"'$  (1.702 m).   Weight as of this encounter: 83.9 kg.  Code  Status: Full code DVT Prophylaxis:  enoxaparin (LOVENOX) injection 30 mg Start: 05/27/22 2200   Level of Care: Level of care: Telemetry Family Communication: Updated patient Disposition Plan:      Remains inpatient appropriate:     Procedures:    Consultants:   Cardiology Nephrology Podiatry  Antimicrobials: None    Medications  aspirin EC  81 mg Oral Daily   carvedilol  12.5 mg Oral BID WC   clopidogrel  75 mg Oral Daily   enoxaparin (LOVENOX) injection  30 mg Subcutaneous Q24H   hydrALAZINE  25 mg Oral TID   insulin aspart  0-15 Units Subcutaneous TID WC   insulin aspart  0-5 Units Subcutaneous QHS   isosorbide mononitrate  30 mg Oral Daily   pantoprazole  40 mg Oral Q0600   polyethylene glycol  17 g Oral Daily   sodium chloride flush  3 mL Intravenous Q12H   cyanocobalamin  500 mcg Oral Daily      Subjective:   Caleb [name] was seen and examined today.  No acute complaints, states shortness of breath still the same, not worsening.  No dizziness, chest pain, abdominal pain.  Had episodes of nausea and vomiting in ED yesterday, feels likely due to tramadol.  No fevers.  Objective:   Vitals:   05/27/22 2055 05/28/22 0033 05/28/22 0617 05/28/22 0949  BP: (!) 147/108 127/84 (!) 122/92 93/69  Pulse: 100 88 86 81  Resp: '20 19 18 19  '$ Temp: 98.3 F (36.8 C) 97.7 F (36.5 C) 98.3 F (36.8 C) 98.9 F (37.2 C)  TempSrc: Oral Oral Oral Oral  SpO2: 99% 98% 97% 97%  Weight:   83.9 kg   Height:        Intake/Output Summary (Last 24 hours) at 05/28/2022 1204 Last data filed at 05/28/2022 [id number] Gross per 24 hour  Intake 363 ml  Output 400 ml  Net -37 ml     Wt Readings from Last 3 Encounters:  05/28/22 83.9 kg  05/19/22 81.9 kg  05/13/22 81.2 kg     Exam General: Alert and oriented x 3, NAD Cardiovascular: S1 S2 auscultated,  RRR, + JVD Respiratory: Bibasilar crackles Gastrointestinal: Soft, nontender, nondistended, + bowel sounds Ext: no pedal edema  bilaterally Neuro: no new FNDs Skin: Right heel ulcer with eschar, no drainage Psych: Normal affect and demeanor, alert and oriented x3     Data Reviewed:  I have personally reviewed following labs    CBC Lab Results  Component Value Date   WBC 7.5 05/28/2022  RBC 4.56 05/28/2022   HGB 11.9 (L) 05/28/2022   HCT 37.6 (L) 05/28/2022   MCV 82.5 05/28/2022   MCH 26.1 05/28/2022   PLT 283 05/28/2022   MCHC 31.6 05/28/2022   RDW 15.3 05/28/2022   LYMPHSABS 1.1 05/27/2022   MONOABS 0.5 05/27/2022   EOSABS 0.0 05/27/2022   BASOSABS 0.0 [id number]     Last metabolic panel Lab Results  Component Value Date   NA 131 (L) 05/28/2022   K 4.6 05/28/2022   CL 98 05/28/2022   CO2 23 05/28/2022   BUN 62 (H) 05/28/2022   CREATININE 2.77 (H) 05/28/2022   GLUCOSE 163 (H) 05/28/2022   GFRNONAA 24 (L) 05/28/2022   GFRAA >60 12/17/2019   CALCIUM 8.0 (L) 05/28/2022   PROT 6.7 05/28/2022   ALBUMIN 3.0 (L) 05/28/2022   LABGLOB 3.2 03/09/2022   AGRATIO 1.0 (L) 01/19/2021   BILITOT 0.6 05/28/2022   ALKPHOS 135 (H) 05/28/2022   AST 36 05/28/2022   ALT 39 05/28/2022   ANIONGAP 10 05/28/2022    CBG (last 3)  Recent Labs    05/27/22 2116 05/28/22 0753 05/28/22 1151  GLUCAP 200* 167* 211*      Coagulation Profile: No results for input(s): "INR", "PROTIME" in the last 168 hours.   Radiology Studies: I have personally reviewed the imaging studies  US RENAL  Result Date: 05/27/2022 CLINICAL DATA:  Acute kidney injury EXAM: RENAL / URINARY TRACT ULTRASOUND COMPLETE COMPARISON:  CT 11/16/2021 FINDINGS: Right Kidney: Renal measurements: 10.1 x 5.1 x 5.6 cm = volume: 149 mL. Echogenicity within normal limits. No mass or hydronephrosis visualized. Left Kidney: Renal measurements: 10.9 x 5.8 x 5.5 cm = volume: 183 mL. Echogenicity within normal limits. No mass or hydronephrosis visualized. Bladder: Appears normal for degree of bladder distention. Other: None. IMPRESSION: Unremarkable  sonographic appearance of the kidneys. Electronically Signed   By: Davina Poke D.O.   On: 05/27/2022 18:33   DG Chest 2 View  Result Date: 05/27/2022 CLINICAL DATA:  Shortness of breath EXAM: CHEST - 2 VIEW COMPARISON:  01/07/2022 FINDINGS: Mild cardiomegaly. TAVR. Pulmonary vascular congestion with mild diffuse interstitial prominence. No pleural effusion. No pneumothorax. IMPRESSION: Mild cardiomegaly with pulmonary vascular congestion and mild diffuse interstitial prominence, suggesting CHF with mild interstitial edema. Electronically Signed   By: Davina Poke D.O.   On: 05/27/2022 16:02       Catlin Aycock M.D. [facility] 05/28/2022, 12:04 PM  Available via Epic secure chat 7am-7pm After 7 pm, please refer to night coverage provider listed on amion.

## 2022-05-28 NOTE — Progress Notes (Addendum)
[facility] Progress Note  Subjective: UOP 1000 cc yesterday, still w/ DOE  Vitals:   05/28/22 0033 05/28/22 0617 05/28/22 0949 05/28/22 1231  BP: 127/84 (!) 122/92 93/69 108/88  Pulse: 88 86 81 79  Resp: '19 18 19 18  '$ Temp: 97.7 F (36.5 C) 98.3 F (36.8 C) 98.9 F (37.2 C) 98.7 F (37.1 C)  TempSrc: Oral Oral Oral Oral  SpO2: 98% 97% 97% 94%  Weight:  83.9 kg    Height:        Exam: GEN lying in bed, awake and alert HEENT EOMI PERRL NECK + JVD PULM on RA, bibasilar crackles CV RRR crisp S2 ABD soft EXT trace LE edema, R heel with + scabbed ulcer NEURO AAO x 3 nonfocal  UA 0-5 rbc/6-11 wbc, prot 30 UCr 92, UP/C 0.62  Renal US - 10.1/ 10.9 cm kidneys, no hydro  3/08 CXR - vasc congestion w/ mild pulm edema   Assessment/ Plan: AKI on CKD 3b: b/l creatinine 1.8.  Creat here 2.5 in the setting of multiple nephrotoxic meds- NSAIDS, Entresto, Synjardy, and decomp CHF w/ pulm edema. Renal US,  UA and UP/C unremarkable. 24 hr urine collection in October negative for M-spike, will not repeat now. Will need to start IV lasix today w/ '80mg'$  bid. Had good response yest to '60mg'$  IV lasix w/ 1000 cc UOP. No indication for dialysis. Will follow.   Acute on chronic systolic CHF exacerbation - keep on BB, hydral/ imdur, lasix today DM II - holding Synjardy, per primary HTN - BP's are soft, will dc hydralazine and add hold orders to coreg.  Hyperkalemia - mild, lokelma x 1 given, K+ better Diabetic ulcer - could use a wound care c/s   Kelly Splinter MD [facility] 05/28/2022, 3:43 PM  Recent Labs  Lab 05/27/22 1513 05/27/22 1900 05/27/22 2118 05/28/22 0429 05/28/22 0910  HGB 14.3  --  14.1 11.9*  --   ALBUMIN 3.4*  --   --  2.8* 3.0*  CALCIUM 8.3*  --   --  8.0*  --   CREATININE 2.62* 2.66*  --  2.77*  --   K 5.2*  --   --  4.6  --    No results for input(s): "IRON", "TIBC", "FERRITIN" in the last 168 hours. Inpatient medications:  aspirin EC  81 mg Oral Daily   [START  ON 05/29/2022] atorvastatin  40 mg Oral Daily   carvedilol  12.5 mg Oral BID WC   clopidogrel  75 mg Oral Daily   enoxaparin (LOVENOX) injection  30 mg Subcutaneous Q24H   furosemide  60 mg Intravenous Once   hydrALAZINE  25 mg Oral TID   insulin aspart  0-15 Units Subcutaneous TID WC   insulin aspart  0-5 Units Subcutaneous QHS   insulin aspart  3 Units Subcutaneous TID WC   insulin glargine-yfgn  10 Units Subcutaneous Daily   isosorbide mononitrate  30 mg Oral Daily   pantoprazole  40 mg Oral Q0600   polyethylene glycol  17 g Oral Daily   sodium chloride flush  3 mL Intravenous Q12H   cyanocobalamin  500 mcg Oral Daily    sodium chloride     sodium chloride, acetaminophen, fentaNYL (SUBLIMAZE) injection, hydrALAZINE, ondansetron (ZOFRAN) IV, mouth rinse, sodium chloride flush

## 2022-05-28 NOTE — Progress Notes (Signed)
  Echocardiogram 2D Echocardiogram has been performed.  Caleb [name] 05/28/2022, 5:50 PM

## 2022-05-29 ENCOUNTER — Inpatient Hospital Stay (HOSPITAL_COMMUNITY): Payer: No Typology Code available for payment source

## 2022-05-29 DIAGNOSIS — I739 Peripheral vascular disease, unspecified: Secondary | ICD-10-CM | POA: Diagnosis not present

## 2022-05-29 DIAGNOSIS — I160 Hypertensive urgency: Secondary | ICD-10-CM | POA: Diagnosis not present

## 2022-05-29 DIAGNOSIS — L89610 Pressure ulcer of right heel, unstageable: Secondary | ICD-10-CM

## 2022-05-29 DIAGNOSIS — I509 Heart failure, unspecified: Secondary | ICD-10-CM | POA: Diagnosis not present

## 2022-05-29 DIAGNOSIS — I5023 Acute on chronic systolic (congestive) heart failure: Secondary | ICD-10-CM | POA: Diagnosis not present

## 2022-05-29 DIAGNOSIS — N179 Acute kidney failure, unspecified: Secondary | ICD-10-CM | POA: Diagnosis not present

## 2022-05-29 LAB — COMPREHENSIVE METABOLIC PANEL
ALT: 36 U/L (ref 0–44)
AST: 26 U/L (ref 15–41)
Albumin: 3.1 g/dL — ABNORMAL LOW (ref 3.5–5.0)
Alkaline Phosphatase: 129 U/L — ABNORMAL HIGH (ref 38–126)
Anion gap: 9 (ref 5–15)
BUN: 60 mg/dL — ABNORMAL HIGH (ref 8–23)
CO2: 30 mmol/L (ref 22–32)
Calcium: 8.3 mg/dL — ABNORMAL LOW (ref 8.9–10.3)
Chloride: 95 mmol/L — ABNORMAL LOW (ref 98–111)
Creatinine, Ser: 2.52 mg/dL — ABNORMAL HIGH (ref 0.61–1.24)
GFR, Estimated: 26 mL/min — ABNORMAL LOW (ref >60)
Glucose, Bld: 154 mg/dL — ABNORMAL HIGH (ref 70–99)
Potassium: 4.5 mmol/L (ref 3.5–5.1)
Sodium: 134 mmol/L — ABNORMAL LOW (ref 135–145)
Total Bilirubin: 0.5 mg/dL (ref 0.3–1.2)
Total Protein: 7.2 g/dL (ref 6.5–8.1)

## 2022-05-29 LAB — CBC
HCT: 42.7 % (ref 39.0–52.0)
Hemoglobin: 14.1 g/dL (ref 13.0–17.0)
MCH: 27.5 pg (ref 26.0–34.0)
MCHC: 33 g/dL (ref 30.0–36.0)
MCV: 83.2 fL (ref 80.0–100.0)
Platelets: 304 10*3/uL (ref 150–400)
RBC: 5.13 MIL/uL (ref 4.22–5.81)
RDW: 15.2 % (ref 11.5–15.5)
WBC: 7.6 10*3/uL (ref 4.0–10.5)
nRBC: 0 % (ref 0.0–0.2)

## 2022-05-29 LAB — GLUCOSE, CAPILLARY
Glucose-Capillary: 120 mg/dL — ABNORMAL HIGH (ref 70–99)
Glucose-Capillary: 187 mg/dL — ABNORMAL HIGH (ref 70–99)
Glucose-Capillary: 190 mg/dL — ABNORMAL HIGH (ref 70–99)
Glucose-Capillary: 190 mg/dL — ABNORMAL HIGH (ref 70–99)

## 2022-05-29 LAB — SODIUM, URINE, RANDOM: Sodium, Ur: 30 mmol/L

## 2022-05-29 LAB — VAS US ABI WITH/WO TBI
Left ABI: 0.94
Right ABI: 0.71

## 2022-05-29 NOTE — Plan of Care (Signed)
  Problem: Education: Goal: Knowledge of General Education information will improve Description: Including pain rating scale, medication(s)/side effects and non-pharmacologic comfort measures Outcome: Progressing   Problem: Activity: Goal: Risk for activity intolerance will decrease Outcome: Progressing   Problem: Nutrition: Goal: Adequate nutrition will be maintained Outcome: Progressing   Problem: Coping: Goal: Level of anxiety will decrease Outcome: Progressing   Problem: Pain Managment: Goal: General experience of comfort will improve Outcome: Progressing   Problem: Safety: Goal: Ability to remain free from injury will improve Outcome: Progressing   Problem: Skin Integrity: Goal: Risk for impaired skin integrity will decrease Outcome: Progressing     Problem: Elimination: Goal: Will not experience complications related to bowel motility Outcome: Progressing Goal: Will not experience complications related to urinary retention Outcome: Progressing

## 2022-05-29 NOTE — Progress Notes (Signed)
[facility]                                                                              Shyam Heglund, is a 72 y.o. male, DOB - Jan 09, 1951, [id number] Admit date - 05/27/2022    Outpatient Primary MD for the patient is [name], MD  LOS - 2  days  Chief Complaint  Patient presents with   Shortness of Breath       Brief summary   BERND BLINCOE is a 72 y.o. male with PMH significant of chronic systolic CHF, EF 35 to [id number], G1 DD per 2D echo on 02/16/2022, diabetes mellitus type 2, GERD, HTN, HLP, diabetic neuropathy, aortic stenosis, TAVR in 12/2021, CKD stage IIIb presented to ED with shortness of breath, nausea and vomiting.  Patient reported that shortness of breath has been going on for at least a week, no chest pain, no significant peripheral edema.  This morning he woke up and started having nausea and vomiting with 4 episodes of emesis.  No hematemesis.  No abdominal pain or diarrhea.  He has right heel ulcer and has been seeing podiatry.  Patient reported that he had started tramadol and also has been taking a large amount of ibuprofen for the pain in the last week. Of note, patient was seen in cardiology office on 05/19/2022 and had recently increased his Lasix to 80 mg daily.   ED course:  In ED, temp 97.6, respiratory rate 17, pulse 91, BP 161/113, O2 sats 100% on room air Sodium 133, potassium 5.2, CO2 22, BUN 53, creatinine 2.62.  Creatinine was 1.8 on 05/12/2022.  Baseline creatinine 1.2-1.3   Assessment & Plan   Principal Problem:   Acute kidney injury superimposed on chronic kidney disease 3B ([employer]) -Baseline creatinine 1.2-1.3, most recently 1.8 on 05/12/2022.  On admission 2.62, BUN 53.   - Cr worsened acutely due to  NSAID's, nausea vomiting, intravascular depletion with dehydration, recently increased Lasix to 80 mg daily, and Entresto -Appreciate cardiology and nephrology assistance -Started on IV Lasix yesterday, creatinine improving to 2.52  today     Active Problems:   Acute on chronic systolic and diastolic CHF (congestive heart failure) ([place]), history of CAD -2D echo 01/2022 had shown EF of 35 to 40%, G1 DD, stable TAVR -Chest x-ray with pulmonary edema, elevated BNP > 4500, JVD -Appreciate urology and nephrology recommendations.  Started on IV Lasix with symptomatic improvement, feels the shortness of breath is improving -Started on IV Lasix, 80 mg BID, negative balance of 4.9 L -2D echo showed EF 25 to 30%, global hypokinesis, G2 DD      Hypertensive urgency -BP stable, continue Coreg, Lasix, Imdur -Entresto on hold   Nausea vomiting, dehydration -Now improving, likely due to tramadol,  -currently no nausea or vomiting, tolerating diet   Hyperkalemia Resolved     Peripheral vascular disease ([place]), carotid artery stenosis s/p bilateral TCAR -Continue Plavix, statin -Per patient, he is being worked up for upcoming vascular surgeries for PAD. -He has ABIs scheduled for 05/30/2022 and follow-up with Dr. Gwenlyn Found.     Type 2 diabetes mellitus with hyperlipidemia (HCC) -  Hemoglobin A1c 7.9 on 04/25/2022. Hold empagliflozin- metformin CBG (last 3)  Recent Labs    05/28/22 1609 05/28/22 2108 05/29/22 0750  GLUCAP 171* 158* 120*   -Continue moderate SSI, Semglee 10 units daily, and NovoLog meal coverage 3 units TID AC     Hyperlipidemia -Continue Lipitor     Diabetic neuropathy (HCC) -Hold Neurontin due to AKI     Decubitus ulcer of right heel -Appears to have dry ulcer with eschar on the right heel, no gangrene or drainage -Per patient he was taking excessive ibuprofen for the right heel ulcer and was been placed on tramadol by podiatry, Dr. Posey Pronto.   -Tramadol caused nausea and vomiting hence discontinued -Podiatry consulted, discussed with Dr Loel Lofty on call on 3/9, had recommended ABIs, ordered.  Awaiting for recommendations.    Severe AS status post TAVR in 10/23 -2D echo showed stable TAVR  Estimated  body mass index is 28.21 kg/m as calculated from the following:   Height as of this encounter: '5\' 7"'$  (1.702 m).   Weight as of this encounter: 81.7 kg.  Code Status: Full code DVT Prophylaxis:  enoxaparin (LOVENOX) injection 30 mg Start: 05/27/22 2200   Level of Care: Level of care: Telemetry Family Communication: Updated patient Disposition Plan:      Remains inpatient appropriate:     Procedures:  2D echo  Consultants:   Cardiology Nephrology Podiatry  Antimicrobials: None    Medications  aspirin EC  81 mg Oral Daily   atorvastatin  40 mg Oral Daily   carvedilol  12.5 mg Oral BID WC   clopidogrel  75 mg Oral Daily   enoxaparin (LOVENOX) injection  30 mg Subcutaneous Q24H   furosemide  80 mg Intravenous BID   insulin aspart  0-15 Units Subcutaneous TID WC   insulin aspart  0-5 Units Subcutaneous QHS   insulin aspart  3 Units Subcutaneous TID WC   insulin glargine-yfgn  10 Units Subcutaneous Daily   isosorbide mononitrate  30 mg Oral Daily   pantoprazole  40 mg Oral Q0600   polyethylene glycol  17 g Oral Daily   sodium chloride flush  3 mL Intravenous Q12H   cyanocobalamin  500 mcg Oral Daily      Subjective:   Refael Bergara was seen and examined today.  Feeling better today, states no chest pain and shortness of breath is improving.  Had significant diuresis with IV Lasix started on 3/9.   No further nausea vomiting, tolerating diet.  No fevers.  Objective:   Vitals:   05/29/22 0500 05/29/22 0610 05/29/22 0836 05/29/22 0904  BP:  (!) 128/99 116/85   Pulse:  89 92   Resp:  (!) 22    Temp:  97.7 F (36.5 C)    TempSrc:  Oral    SpO2:  98%    Weight: 80.6 kg   81.7 kg  Height:        Intake/Output Summary (Last 24 hours) at 05/29/2022 1028 Last data filed at 05/29/2022 0549 Gross per 24 hour  Intake 880 ml  Output 5825 ml  Net -4945 ml     Wt Readings from Last 3 Encounters:  05/29/22 81.7 kg  05/19/22 81.9 kg  05/13/22 81.2 kg    Physical  Exam General: Alert and oriented x 3, NAD Cardiovascular: S1 S2 clear, RRR. + JVD Respiratory: Bibasilar crackles Gastrointestinal: Soft, nontender, nondistended, NBS Ext: no pedal edema bilaterally Neuro: no new deficits Skin:  right heel ulcer with eschar Psych:  Normal affect     Data Reviewed:  I have personally reviewed following labs    CBC Lab Results  Component Value Date   WBC 7.6 05/29/2022   RBC 5.13 05/29/2022   HGB 14.1 05/29/2022   HCT 42.7 05/29/2022   MCV 83.2 05/29/2022   MCH 27.5 05/29/2022   PLT 304 05/29/2022   MCHC 33.0 05/29/2022   RDW 15.2 05/29/2022   LYMPHSABS 1.1 05/27/2022   MONOABS 0.5 05/27/2022   EOSABS 0.0 05/27/2022   BASOSABS 0.0 [id number]     Last metabolic panel Lab Results  Component Value Date   NA 134 (L) 05/29/2022   K 4.5 05/29/2022   CL 95 (L) 05/29/2022   CO2 30 05/29/2022   BUN 60 (H) 05/29/2022   CREATININE 2.52 (H) 05/29/2022   GLUCOSE 154 (H) 05/29/2022   GFRNONAA 26 (L) 05/29/2022   GFRAA >60 12/17/2019   CALCIUM 8.3 (L) 05/29/2022   PROT 7.2 05/29/2022   ALBUMIN 3.1 (L) 05/29/2022   LABGLOB 3.2 03/09/2022   AGRATIO 1.0 (L) 01/19/2021   BILITOT 0.5 05/29/2022   ALKPHOS 129 (H) 05/29/2022   AST 26 05/29/2022   ALT 36 05/29/2022   ANIONGAP 9 05/29/2022    CBG (last 3)  Recent Labs    05/28/22 1609 05/28/22 2108 05/29/22 0750  GLUCAP 171* 158* 120*      Coagulation Profile: No results for input(s): "INR", "PROTIME" in the last 168 hours.   Radiology Studies: I have personally reviewed the imaging studies  DG CHEST PORT 1 VIEW  Result Date: 05/29/2022 CLINICAL DATA:  72 year old male with history of pulmonary edema. EXAM: PORTABLE CHEST 1 VIEW COMPARISON:  Chest x-ray 05/2022. FINDINGS: Lung volumes are normal. No consolidative airspace disease. No pleural effusions. No pneumothorax. No evidence of pulmonary edema. Heart size is mildly enlarged. Upper mediastinal contours are within normal limits.  Status post TAVR. IMPRESSION: 1. No radiographic evidence of acute cardiopulmonary disease. 2. Mild cardiomegaly (unchanged). 3. Status post TAVR. Electronically Signed   By: Vinnie Langton M.D.   On: 05/29/2022 10:06   ECHOCARDIOGRAM COMPLETE  Result Date: 05/28/2022    ECHOCARDIOGRAM REPORT   Patient Name:   Caleb [name] Date of Exam: 05/28/2022 Medical Rec #:  [id number]       Height:       67.0 in Accession #:    [id number]      Weight:       185.0 lb Date of Birth:  05-12-1950        BSA:          1.956 m Patient Age:    50 years        BP:           114/86 mmHg Patient Gender: M               HR:           92 bpm. Exam Location:  Inpatient Procedure: 2D Echo, Color Doppler and Cardiac Doppler Indications:    dyspnea  History:        Patient has prior history of Echocardiogram examinations, most                 recent 02/16/2022. CHF, PVD. chronic kidney disease.; Risk                 Factors:Diabetes, Hypertension and Dyslipidemia.                 Aortic Valve: 26  mm Sapien prosthetic, stented (TAVR) valve is                 present in the aortic position. Procedure Date: 01/11/22.  Sonographer:    Johny Chess RDCS Referring Phys: [id number] [place]  1. Left ventricular ejection fraction, by estimation, is 25 to 30%. Left ventricular ejection fraction by 2D MOD biplane is 26.8 %. The left ventricle has severely decreased function. The left ventricle demonstrates global hypokinesis. There is mild left ventricular hypertrophy. Left ventricular diastolic parameters are consistent with Grade II diastolic dysfunction (pseudonormalization). Elevated left ventricular end-diastolic pressure. The E/e' is 41. There is severe akinesis of the left ventricular, mid-apical anteroseptal wall, septal wall, anterior wall, inferoapical segment and anterolateral wall.  2. Right ventricular systolic function is mildly reduced. The right ventricular size is normal. There is moderately elevated  pulmonary artery systolic pressure. The estimated right ventricular systolic pressure is 0000000 mmHg.  3. Left atrial size was mildly dilated.  4. The mitral valve is abnormal. Mild mitral valve regurgitation.  5. The aortic valve has been repaired/replaced. Aortic valve regurgitation is not visualized. There is a 26 mm Sapien prosthetic (TAVR) valve present in the aortic position. Procedure Date: 01/11/22. Aortic valve area, by VTI measures 1.03 cm. Aortic valve mean gradient measures 8.5 mmHg. Aortic valve Vmax measures 2.05 m/s.  6. The inferior vena cava is dilated in size with <50% respiratory variability, suggesting right atrial pressure of 15 mmHg. Comparison(s): Changes from prior study are noted. 01/12/2022: LVEF 20-25%, global HK, TAVR MG 5.7 mmHg. FINDINGS  Left Ventricle: Left ventricular ejection fraction, by estimation, is 25 to 30%. Left ventricular ejection fraction by 2D MOD biplane is 26.8 %. The left ventricle has severely decreased function. The left ventricle demonstrates global hypokinesis. Severe akinesis of the left ventricular, mid-apical anteroseptal wall, septal wall, anterior wall, inferoapical segment and anterolateral wall. The left ventricular internal cavity size was normal in size. There is mild left ventricular hypertrophy. Left  ventricular diastolic parameters are consistent with Grade II diastolic dysfunction (pseudonormalization). Elevated left ventricular end-diastolic pressure. The E/e' is 35.  LV Wall Scoring: The mid and distal anterior septum, entire apex, and mid inferoseptal segment are akinetic. Right Ventricle: The right ventricular size is normal. No increase in right ventricular wall thickness. Right ventricular systolic function is mildly reduced. There is moderately elevated pulmonary artery systolic pressure. The tricuspid regurgitant velocity is 3.24 m/s, and with an assumed right atrial pressure of 15 mmHg, the estimated right ventricular systolic pressure is 0000000  mmHg. Left Atrium: Left atrial size was mildly dilated. Right Atrium: Right atrial size was normal in size. Pericardium: There is no evidence of pericardial effusion. Mitral Valve: The mitral valve is abnormal. There is mild calcification of the posterior mitral valve leaflet(s). Mild to moderate mitral annular calcification. Mild mitral valve regurgitation. MV peak gradient, 6.4 mmHg. The mean mitral valve gradient is 3.0 mmHg. Tricuspid Valve: The tricuspid valve is grossly normal. Tricuspid valve regurgitation is mild. Aortic Valve: The aortic valve has been repaired/replaced. Aortic valve regurgitation is not visualized. Aortic valve mean gradient measures 8.5 mmHg. Aortic valve peak gradient measures 16.9 mmHg. Aortic valve area, by VTI measures 1.03 cm. There is a 26 mm Sapien prosthetic, stented (TAVR) valve present in the aortic position. Procedure Date: 01/11/22. Pulmonic Valve: The pulmonic valve was grossly normal. Pulmonic valve regurgitation is trivial. Aorta: The aortic root and ascending aorta are structurally normal, with no evidence of dilitation. Venous:  The inferior vena cava is dilated in size with less than 50% respiratory variability, suggesting right atrial pressure of 15 mmHg. IAS/Shunts: No atrial level shunt detected by color flow Doppler.  LEFT VENTRICLE PLAX 2D                        Biplane EF (MOD) LVIDd:         5.10 cm         LV Biplane EF:   Left LVIDs:         4.40 cm                          ventricular LV PW:         1.10 cm                          ejection LV IVS:        1.10 cm                          fraction by LVOT diam:     1.70 cm                          2D MOD LV SV:         38                               biplane is LV SV Index:   19                               26.8 %. LVOT Area:     2.27 cm                                Diastology                                LV e' medial:    4.24 cm/s LV Volumes (MOD)               LV E/e' medial:  25.7 LV vol d, MOD     195.5 ml      LV e' lateral:   5.22 cm/s A2C:                           LV E/e' lateral: 20.9 LV vol d, MOD    153.1 ml A4C: LV vol s, MOD    145.0 ml A2C: LV vol s, MOD    128.6 ml A4C: LV SV MOD A2C:   50.5 ml LV SV MOD A4C:   153.1 ml LV SV MOD BP:    47.8 ml RIGHT VENTRICLE            IVC RV Basal diam:  2.90 cm    IVC diam: 2.60 cm RV S prime:     9.57 cm/s TAPSE (M-mode): 0.8 cm LEFT ATRIUM             Index        RIGHT ATRIUM           Index  LA diam:        4.00 cm 2.04 cm/m   RA Area:     15.80 cm LA Vol (A2C):   70.5 ml 36.04 ml/m  RA Volume:   42.70 ml  21.83 ml/m LA Vol (A4C):   59.3 ml 30.31 ml/m LA Biplane Vol: 70.2 ml 35.89 ml/m  AORTIC VALVE AV Area (Vmax):    1.02 cm AV Area (Vmean):   1.02 cm AV Area (VTI):     1.03 cm AV Vmax:           205.50 cm/s AV Vmean:          134.000 cm/s AV VTI:            0.364 m AV Peak Grad:      16.9 mmHg AV Mean Grad:      8.5 mmHg LVOT Vmax:         92.20 cm/s LVOT Vmean:        60.450 cm/s LVOT VTI:          0.166 m LVOT/AV VTI ratio: 0.45  AORTA Ao Root diam: 3.30 cm Ao Asc diam:  3.10 cm MITRAL VALVE                TRICUSPID VALVE MV Area (PHT): 5.66 cm     TR Peak grad:   42.0 mmHg MV Area VTI:   1.42 cm     TR Vmax:        324.00 cm/s MV Peak grad:  6.4 mmHg MV Mean grad:  3.0 mmHg     SHUNTS MV Vmax:       1.26 m/s     Systemic VTI:  0.17 m MV Vmean:      87.4 cm/s    Systemic Diam: 1.70 cm MV Decel Time: 134 msec MV E velocity: 109.00 cm/s MV A velocity: 85.90 cm/s MV E/A ratio:  1.27 Lyman Bishop MD Electronically signed by Lyman Bishop MD Signature Date/Time: 05/28/2022/6:48:53 PM    Final    US RENAL  Result Date: 05/27/2022 CLINICAL DATA:  Acute kidney injury EXAM: RENAL / URINARY TRACT ULTRASOUND COMPLETE COMPARISON:  CT 11/16/2021 FINDINGS: Right Kidney: Renal measurements: 10.1 x 5.1 x 5.6 cm = volume: 149 mL. Echogenicity within normal limits. No mass or hydronephrosis visualized. Left Kidney: Renal measurements: 10.9 x 5.8 x 5.5 cm =  volume: 183 mL. Echogenicity within normal limits. No mass or hydronephrosis visualized. Bladder: Appears normal for degree of bladder distention. Other: None. IMPRESSION: Unremarkable sonographic appearance of the kidneys. Electronically Signed   By: Davina Poke D.O.   On: 05/27/2022 18:33   DG Chest 2 View  Result Date: 05/27/2022 CLINICAL DATA:  Shortness of breath EXAM: CHEST - 2 VIEW COMPARISON:  01/07/2022 FINDINGS: Mild cardiomegaly. TAVR. Pulmonary vascular congestion with mild diffuse interstitial prominence. No pleural effusion. No pneumothorax. IMPRESSION: Mild cardiomegaly with pulmonary vascular congestion and mild diffuse interstitial prominence, suggesting CHF with mild interstitial edema. Electronically Signed   By: Davina Poke D.O.   On: 05/27/2022 16:02       Chase Knebel M.D. [facility] 05/29/2022, 10:28 AM  Available via Epic secure chat 7am-7pm After 7 pm, please refer to night coverage provider listed on amion.

## 2022-05-29 NOTE — Progress Notes (Signed)
Orthopedic Tech Progress Note Patient Details:  Caleb [name] Dec 09, 1950 [id number] Dropped off in room Ortho Devices Type of Ortho Device: Postop shoe/boot Ortho Device/Splint Location: RLE Ortho Device/Splint Interventions: Ordered   Post Interventions Instructions Provided: Adjustment of device, Care of device, Poper ambulation with device  [name] 05/29/2022, 11:57 AM

## 2022-05-29 NOTE — Progress Notes (Signed)
ABI's have been completed. Preliminary results can be found in CV Proc through chart review.   05/29/22 11:21 AM Caleb [name] RVT

## 2022-05-29 NOTE — Progress Notes (Signed)
Rounding Note    Patient Name: Caleb [name] Date of Encounter: 05/29/2022  [place] Cardiologist: Quay Burow, MD   Subjective   SOB improving  Inpatient Medications    Scheduled Meds:  aspirin EC  81 mg Oral Daily   atorvastatin  40 mg Oral Daily   carvedilol  12.5 mg Oral BID WC   clopidogrel  75 mg Oral Daily   enoxaparin (LOVENOX) injection  30 mg Subcutaneous Q24H   furosemide  80 mg Intravenous BID   insulin aspart  0-15 Units Subcutaneous TID WC   insulin aspart  0-5 Units Subcutaneous QHS   insulin aspart  3 Units Subcutaneous TID WC   insulin glargine-yfgn  10 Units Subcutaneous Daily   isosorbide mononitrate  30 mg Oral Daily   pantoprazole  40 mg Oral Q0600   polyethylene glycol  17 g Oral Daily   sodium chloride flush  3 mL Intravenous Q12H   cyanocobalamin  500 mcg Oral Daily   Continuous Infusions:  sodium chloride     PRN Meds: sodium chloride, acetaminophen, fentaNYL (SUBLIMAZE) injection, hydrALAZINE, ondansetron (ZOFRAN) IV, mouth rinse, sodium chloride flush   Vital Signs    Vitals:   05/28/22 2111 05/29/22 0500 05/29/22 0610 05/29/22 0836  BP: 119/81  (!) 128/99 116/85  Pulse: 81  89 92  Resp: 18  (!) 22   Temp: 97.9 F (36.6 C)  97.7 F (36.5 C)   TempSrc: Oral  Oral   SpO2: 100%  98%   Weight:  80.6 kg    Height:        Intake/Output Summary (Last 24 hours) at 05/29/2022 0853 Last data filed at 05/29/2022 0549 Gross per 24 hour  Intake 880 ml  Output 5825 ml  Net -4945 ml      05/29/2022    5:00 AM 05/28/2022    6:17 AM 05/27/2022    2:51 PM  Last 3 Weights  Weight (lbs) 177 lb 11.1 oz 184 lb 15.5 oz 177 lb  Weight (kg) 80.6 kg 83.9 kg 80.287 kg      Telemetry    NSR - Personally Reviewed  ECG    N/a - Personally Reviewed  Physical Exam   GEN: No acute distress.   Neck: mild JVD Cardiac: RRR, no murmurs, rubs, or gallops.  Respiratory: Crackles bilaterally GI: Soft, nontender, non-distended  MS:  No edema; No deformity. Neuro:  Nonfocal  Psych: Normal affect   Labs    High Sensitivity Troponin:   Recent Labs  Lab 05/27/22 1513 05/27/22 1900  TROPONINIHS 59* 65*     Chemistry Recent Labs  Lab 05/27/22 1513 05/27/22 1900 05/28/22 0429 05/28/22 0910 05/29/22 0441  NA 133*  --  131*  --  134*  K 5.2*  --  4.6  --  4.5  CL 101  --  98  --  95*  CO2 22  --  23  --  30  GLUCOSE 179*  --  163*  --  154*  BUN 53*  --  62*  --  60*  CREATININE 2.62* 2.66* 2.77*  --  2.52*  CALCIUM 8.3*  --  8.0*  --  8.3*  PROT 7.8  --  6.2* 6.7 7.2  ALBUMIN 3.4*  --  2.8* 3.0* 3.1*  AST 50*  --  28 36 26  ALT 57*  --  39 39 36  ALKPHOS 165*  --  131* 135* 129*  BILITOT 0.9  --  0.7 0.6 0.5  GFRNONAA 25* 25* 24*  --  26*  ANIONGAP 10  --  10  --  9    Lipids No results for input(s): "CHOL", "TRIG", "HDL", "LABVLDL", "LDLCALC", "CHOLHDL" in the last 168 hours.  Hematology Recent Labs  Lab 05/27/22 2118 05/28/22 0429 05/29/22 0441  WBC 8.4 7.5 7.6  RBC 5.31 4.56 5.13  HGB 14.1 11.9* 14.1  HCT 44.8 37.6* 42.7  MCV 84.4 82.5 83.2  MCH 26.6 26.1 27.5  MCHC 31.5 31.6 33.0  RDW 15.5 15.3 15.2  PLT 313 283 304   Thyroid No results for input(s): "TSH", "FREET4" in the last 168 hours.  BNP Recent Labs  Lab 05/27/22 1513  BNP >4,500.0*    DDimer No results for input(s): "DDIMER" in the last 168 hours.   Radiology    ECHOCARDIOGRAM COMPLETE  Result Date: 05/28/2022    ECHOCARDIOGRAM REPORT   Patient Name:   Caleb [name] Date of Exam: 05/28/2022 Medical Rec #:  [id number]       Height:       67.0 in Accession #:    [id number]      Weight:       185.0 lb Date of Birth:  Feb 19, 1951        BSA:          1.956 m Patient Age:    72 years        BP:           114/86 mmHg Patient Gender: M               HR:           92 bpm. Exam Location:  Inpatient Procedure: 2D Echo, Color Doppler and Cardiac Doppler Indications:    dyspnea  History:        Patient has prior history of Echocardiogram  examinations, most                 recent 02/16/2022. CHF, PVD. chronic kidney disease.; Risk                 Factors:Diabetes, Hypertension and Dyslipidemia.                 Aortic Valve: 26 mm Sapien prosthetic, stented (TAVR) valve is                 present in the aortic position. Procedure Date: 01/11/22.  Sonographer:    Johny Chess RDCS Referring Phys: [id number] [place]  1. Left ventricular ejection fraction, by estimation, is 25 to 30%. Left ventricular ejection fraction by 2D MOD biplane is 26.8 %. The left ventricle has severely decreased function. The left ventricle demonstrates global hypokinesis. There is mild left ventricular hypertrophy. Left ventricular diastolic parameters are consistent with Grade II diastolic dysfunction (pseudonormalization). Elevated left ventricular end-diastolic pressure. The E/e' is 8. There is severe akinesis of the left ventricular, mid-apical anteroseptal wall, septal wall, anterior wall, inferoapical segment and anterolateral wall.  2. Right ventricular systolic function is mildly reduced. The right ventricular size is normal. There is moderately elevated pulmonary artery systolic pressure. The estimated right ventricular systolic pressure is 0000000 mmHg.  3. Left atrial size was mildly dilated.  4. The mitral valve is abnormal. Mild mitral valve regurgitation.  5. The aortic valve has been repaired/replaced. Aortic valve regurgitation is not visualized. There is a 26 mm Sapien prosthetic (TAVR) valve present in the aortic position. Procedure Date: 01/11/22. Aortic valve  area, by VTI measures 1.03 cm. Aortic valve mean gradient measures 8.5 mmHg. Aortic valve Vmax measures 2.05 m/s.  6. The inferior vena cava is dilated in size with <50% respiratory variability, suggesting right atrial pressure of 15 mmHg. Comparison(s): Changes from prior study are noted. 01/12/2022: LVEF 20-25%, global HK, TAVR MG 5.7 mmHg. FINDINGS  Left Ventricle: Left  ventricular ejection fraction, by estimation, is 25 to 30%. Left ventricular ejection fraction by 2D MOD biplane is 26.8 %. The left ventricle has severely decreased function. The left ventricle demonstrates global hypokinesis. Severe akinesis of the left ventricular, mid-apical anteroseptal wall, septal wall, anterior wall, inferoapical segment and anterolateral wall. The left ventricular internal cavity size was normal in size. There is mild left ventricular hypertrophy. Left  ventricular diastolic parameters are consistent with Grade II diastolic dysfunction (pseudonormalization). Elevated left ventricular end-diastolic pressure. The E/e' is 24.  LV Wall Scoring: The mid and distal anterior septum, entire apex, and mid inferoseptal segment are akinetic. Right Ventricle: The right ventricular size is normal. No increase in right ventricular wall thickness. Right ventricular systolic function is mildly reduced. There is moderately elevated pulmonary artery systolic pressure. The tricuspid regurgitant velocity is 3.24 m/s, and with an assumed right atrial pressure of 15 mmHg, the estimated right ventricular systolic pressure is 0000000 mmHg. Left Atrium: Left atrial size was mildly dilated. Right Atrium: Right atrial size was normal in size. Pericardium: There is no evidence of pericardial effusion. Mitral Valve: The mitral valve is abnormal. There is mild calcification of the posterior mitral valve leaflet(s). Mild to moderate mitral annular calcification. Mild mitral valve regurgitation. MV peak gradient, 6.4 mmHg. The mean mitral valve gradient is 3.0 mmHg. Tricuspid Valve: The tricuspid valve is grossly normal. Tricuspid valve regurgitation is mild. Aortic Valve: The aortic valve has been repaired/replaced. Aortic valve regurgitation is not visualized. Aortic valve mean gradient measures 8.5 mmHg. Aortic valve peak gradient measures 16.9 mmHg. Aortic valve area, by VTI measures 1.03 cm. There is a 26 mm Sapien  prosthetic, stented (TAVR) valve present in the aortic position. Procedure Date: 01/11/22. Pulmonic Valve: The pulmonic valve was grossly normal. Pulmonic valve regurgitation is trivial. Aorta: The aortic root and ascending aorta are structurally normal, with no evidence of dilitation. Venous: The inferior vena cava is dilated in size with less than 50% respiratory variability, suggesting right atrial pressure of 15 mmHg. IAS/Shunts: No atrial level shunt detected by color flow Doppler.  LEFT VENTRICLE PLAX 2D                        Biplane EF (MOD) LVIDd:         5.10 cm         LV Biplane EF:   Left LVIDs:         4.40 cm                          ventricular LV PW:         1.10 cm                          ejection LV IVS:        1.10 cm                          fraction by LVOT diam:     1.70 cm  2D MOD LV SV:         38                               biplane is LV SV Index:   19                               26.8 %. LVOT Area:     2.27 cm                                Diastology                                LV e' medial:    4.24 cm/s LV Volumes (MOD)               LV E/e' medial:  25.7 LV vol d, MOD    195.5 ml      LV e' lateral:   5.22 cm/s A2C:                           LV E/e' lateral: 20.9 LV vol d, MOD    153.1 ml A4C: LV vol s, MOD    145.0 ml A2C: LV vol s, MOD    128.6 ml A4C: LV SV MOD A2C:   50.5 ml LV SV MOD A4C:   153.1 ml LV SV MOD BP:    47.8 ml RIGHT VENTRICLE            IVC RV Basal diam:  2.90 cm    IVC diam: 2.60 cm RV S prime:     9.57 cm/s TAPSE (M-mode): 0.8 cm LEFT ATRIUM             Index        RIGHT ATRIUM           Index LA diam:        4.00 cm 2.04 cm/m   RA Area:     15.80 cm LA Vol (A2C):   70.5 ml 36.04 ml/m  RA Volume:   42.70 ml  21.83 ml/m LA Vol (A4C):   59.3 ml 30.31 ml/m LA Biplane Vol: 70.2 ml 35.89 ml/m  AORTIC VALVE AV Area (Vmax):    1.02 cm AV Area (Vmean):   1.02 cm AV Area (VTI):     1.03 cm AV Vmax:           205.50 cm/s AV Vmean:           134.000 cm/s AV VTI:            0.364 m AV Peak Grad:      16.9 mmHg AV Mean Grad:      8.5 mmHg LVOT Vmax:         92.20 cm/s LVOT Vmean:        60.450 cm/s LVOT VTI:          0.166 m LVOT/AV VTI ratio: 0.45  AORTA Ao Root diam: 3.30 cm Ao Asc diam:  3.10 cm MITRAL VALVE                TRICUSPID VALVE MV Area (PHT): 5.66 cm     TR Peak grad:  42.0 mmHg MV Area VTI:   1.42 cm     TR Vmax:        324.00 cm/s MV Peak grad:  6.4 mmHg MV Mean grad:  3.0 mmHg     SHUNTS MV Vmax:       1.26 m/s     Systemic VTI:  0.17 m MV Vmean:      87.4 cm/s    Systemic Diam: 1.70 cm MV Decel Time: 134 msec MV E velocity: 109.00 cm/s MV A velocity: 85.90 cm/s MV E/A ratio:  1.27 Lyman Bishop MD Electronically signed by Lyman Bishop MD Signature Date/Time: 05/28/2022/6:48:53 PM    Final    US RENAL  Result Date: 05/27/2022 CLINICAL DATA:  Acute kidney injury EXAM: RENAL / URINARY TRACT ULTRASOUND COMPLETE COMPARISON:  CT 11/16/2021 FINDINGS: Right Kidney: Renal measurements: 10.1 x 5.1 x 5.6 cm = volume: 149 mL. Echogenicity within normal limits. No mass or hydronephrosis visualized. Left Kidney: Renal measurements: 10.9 x 5.8 x 5.5 cm = volume: 183 mL. Echogenicity within normal limits. No mass or hydronephrosis visualized. Bladder: Appears normal for degree of bladder distention. Other: None. IMPRESSION: Unremarkable sonographic appearance of the kidneys. Electronically Signed   By: Davina Poke D.O.   On: 05/27/2022 18:33   DG Chest 2 View  Result Date: 05/27/2022 CLINICAL DATA:  Shortness of breath EXAM: CHEST - 2 VIEW COMPARISON:  01/07/2022 FINDINGS: Mild cardiomegaly. TAVR. Pulmonary vascular congestion with mild diffuse interstitial prominence. No pleural effusion. No pneumothorax. IMPRESSION: Mild cardiomegaly with pulmonary vascular congestion and mild diffuse interstitial prominence, suggesting CHF with mild interstitial edema. Electronically Signed   By: Davina Poke D.O.   On: 05/27/2022 16:02     Cardiac Studies     Patient Profile     Caleb [name] is a 72 y.o. male with a hx of  HTN, DM2, CKD 3A, PAD with prior bilateral popliteal stenting, carotid stenosis s/p bilateral TCAR, chronic HFrEF LVEF 20%/NICM, severe AS s/p TAVR 01/11/22 who is being seen 05/28/2022 for the evaluation of SOB at the request of Dr Tana Coast.   Assessment & Plan    1.Acute on chronic HFrEF - 12/2021 echo: LVEF 20-25% - 01/2022 echo: LVEF 35-40%, global hypokinesis, grade I dd, normal RV, AVR no significant stenosis mild PVL - 10/2021 cath: mild to mod nonobstructive CAD. Mean PA 38, PCWP 19 CI inaccurate  - CXR +pulm edema, BNP >4500. Wt 184 lbs, 182 lbs during 02/2022 clinic visit - echo this admit LVEF 25-30%, grade II dd, mild RV dysfunction, normal AVR - labile EF since October   - Presented with AKI making diuresis more difficult.  - received IV lasix '60mg'$  then '80mg'$  yesterday, scheduled for '80mg'$  bid today. Negative 4.7 L yesterday, Cr trending down with diuresis. - continue IV lasix today, remains fluid overloaded. Neprhology ok with diuresis.   - medical therapy with coreg 12.'5mg'$  bid, hydral '25mg'$  tid, imdur 30. Entresto and jardaince on hold given AKI. From HF clinic note prior hyperkalemia, considering trial low dose MRA at f/u but has not been on recently.      2. AKI on CKD - Cr 2 months ago 1.4, 2 weeks ago 1.8. On admit 2.6 with peak to 2.7 this admit. Down trend today to 2.5 -taking NSAIDs at home. Also on entresto - renal US negative.  - ongoing management per renal  - fortunately Cr trending down with diuresis. Nephrology agrees with IV diuretics   3 . History of TAVR 12/2021 -  normal TAVR by echo this admission  For questions or updates, please contact [place] Please consult [email or web address] for contact info under        Signed, Carlyle Dolly, MD  05/29/2022, 8:53 AM

## 2022-05-29 NOTE — Progress Notes (Addendum)
[facility] Progress Note  Subjective: very good diuresis yesterday, 2.9 L yest and 3.2 L today recorded so far. Pt feeling much better.   Vitals:   05/28/22 1740 05/28/22 2111 05/29/22 0500 05/29/22 0610  BP: 119/81 119/81  (!) 128/99  Pulse: 85 81  89  Resp: 17 18  (!) 22  Temp: 98.7 F (37.1 C) 97.9 F (36.6 C)  97.7 F (36.5 C)  TempSrc: Oral Oral  Oral  SpO2: 93% 100%  98%  Weight:   80.6 kg   Height:        Exam: GEN lying in bed, awake and alert HEENT EOMI PERRL NECK + JVD PULM on RA, bibasilar crackles CV RRR crisp S2 ABD soft EXT trace LE edema, R heel with + scabbed ulcer NEURO AAO x 3 nonfocal  UA 0-5 rbc/6-11 wbc, prot 30 UCr 92, UP/C 0.62  Renal US - 10.1/ 10.9 cm kidneys, no hydro  3/08 CXR - vasc congestion w/ mild pulm edema  3/10  CXR - this am, edema/ congestion resolved  Assessment/ Plan: AKI on CKD 3b: b/l creatinine 1.8, eGFR 39 ml/min, from Feb 2024. Creat here 2.5 on admission in the setting of multiple renal-affecting meds- NSAIDS, Entresto, Synjardy- and decomp CHF w/ pulm edema. Renal US,  UA and UP/C were unremarkable. 24 hr urine collection in October negative for M-spike, will not repeat now. Diuresed very well yesterday w/ > 5 L UOP last 48 hrs. BP's stable, edema better, on RA and repeat CXR shows resolved CHF. Will d/w cardiology as to where to go with diuretics now.  Acute on chronic systolic CHF exacerbation - on BB, hydral/ imdur, lasix. LVEF 20%/ NICM. Cardiology is following.  SP TAVR DM II - holding Synjardy, per primary HTN - dc'd hydralazine yesterday due to soft bps, getting coreg only now    Kelly Splinter MD [facility] 05/29/2022, 8:20 AM  Recent Labs  Lab 05/28/22 0429 05/28/22 0910 05/29/22 0441  HGB 11.9*  --  14.1  ALBUMIN 2.8* 3.0* 3.1*  CALCIUM 8.0*  --  8.3*  CREATININE 2.77*  --  2.52*  K 4.6  --  4.5    No results for input(s): "IRON", "TIBC", "FERRITIN" in the last 168 hours. Inpatient medications:   aspirin EC  81 mg Oral Daily   atorvastatin  40 mg Oral Daily   carvedilol  12.5 mg Oral BID WC   clopidogrel  75 mg Oral Daily   enoxaparin (LOVENOX) injection  30 mg Subcutaneous Q24H   furosemide  80 mg Intravenous BID   insulin aspart  0-15 Units Subcutaneous TID WC   insulin aspart  0-5 Units Subcutaneous QHS   insulin aspart  3 Units Subcutaneous TID WC   insulin glargine-yfgn  10 Units Subcutaneous Daily   isosorbide mononitrate  30 mg Oral Daily   pantoprazole  40 mg Oral Q0600   polyethylene glycol  17 g Oral Daily   sodium chloride flush  3 mL Intravenous Q12H   cyanocobalamin  500 mcg Oral Daily    sodium chloride     sodium chloride, acetaminophen, fentaNYL (SUBLIMAZE) injection, hydrALAZINE, ondansetron (ZOFRAN) IV, mouth rinse, sodium chloride flush

## 2022-05-29 NOTE — Consult Note (Signed)
PODIATRY CONSULTATION  NAME Caleb DILLAHUNT MRN [id number] DOB 1950-09-28 DOA 05/27/2022   Reason for consult:  Chief Complaint  Patient presents with   Shortness of Breath    Attending/Consulting physician: Rai MD  History of present illness: 72 y.o. male  w/ PMH significant of chronic systolic CHF, EF 35 to [id number], G1 DD per 2D echo on 02/16/2022, diabetes mellitus type 2, GERD, HTN, HLP, diabetic neuropathy, aortic stenosis, TAVR in 12/2021, CKD stage IIIb presented to ED with shortness of breath, nausea and vomiting. We are asked to see pt for right heel pain and ulceration. Has seen dr patel on 2/27. He was noted to have heel decubitus ulceration with stable eschar. Referred to vascular. He saw vascular for intial apt and was scheduled to have ABI/PVR testing tmrw outpatient. He was planning to see Dr. Gwenlyn Found for possible revasc. Denies drainage from the heel.   Past Medical History:  Diagnosis Date   Carotid artery occlusion    Diabetes mellitus without complication ([place])    Type II   Dyslipidemia 09/27/2012   Erectile dysfunction 09/27/2012   GERD (gastroesophageal reflux disease)    Hypercholesteremia    Hyperlipidemia 08/12/2012   Hypertension    Hyponatremia 08/14/2012   Neuropathy    Pancreatitis    Pancreatitis, acute 09/27/2012   Peripheral vascular disease ([place])    S/P 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 Burt [facility]ack & Bartle   Severe aortic stenosis    Vitamin D deficiency 06/23/2015       Latest Ref Rng & Units 05/29/2022    4:41 AM 05/28/2022    4:29 AM 05/27/2022    9:18 PM  CBC  WBC 4.0 - 10.5 K/uL 7.6  7.5  8.4   Hemoglobin 13.0 - 17.0 g/dL 14.1  11.9  14.1   Hematocrit 39.0 - 52.0 % 42.7  37.6  44.8   Platelets 150 - 400 K/uL 304  283  313        Latest Ref Rng & Units 05/29/2022    4:41 AM 05/28/2022    4:29 AM 05/27/2022    7:00 PM  BMP  Glucose 70 - 99 mg/dL 154  163     BUN 8 - 23 mg/dL 60  62    Creatinine 0.61 - 1.24 mg/dL 2.52  2.77  2.66   Sodium 135 - 145 mmol/L 134  131    Potassium 3.5 - 5.1 mmol/L 4.5  4.6    Chloride 98 - 111 mmol/L 95  98    CO2 22 - 32 mmol/L 30  23    Calcium 8.9 - 10.3 mg/dL 8.3  8.0        Physical Exam: Lower Extremity Exam Vasc: R - PT nonpalpable, DP nonpalpable. Cap refill < 3 sec to digits  L - PT nonpalpable, DP nonpalpable. Cap refill <3 sec to digits  Derm: R - Dry stable 2-3 cm eschar at right heel, no maceration no erythema or drainage   L - Normal temp/texture/turgor with no open lesion or clinical signs of infection  MSK:  R - Pain at right heel  L - s/p partial lateral forefoot amp  Neuro: R - Gross sensation diminshed. Gross motor function intact   L - Gross sensation diminshed. Gross motor function intact    ASSESSMENT/PLAN OF CARE 73 y.o. male with PMHx significant for  DM2 with PAD and neuropathy  with right heel decubitus ulceration, unstageable, no evidence of infection. Primarily vascular disease contributing to the pts pain and nonhealing ulceration at this time.  - Recommend ABI/PVR testing inpatient. Believe pt would benefit from inpatient vascular consultation and evaluation for revascularization /angio.  - No abx indicated for right heel, monitor for maceration/infection - Anticoagulation: per primary - Wound care: betadine pain applied to wound. Mepilex heel protector dressing applied - rec follow up with wound care re frequency of changes for this dressing. As no drainage probably ok to change every 3 days or so. -Recommend prevalon boot to right heel all times when in bed - WB status: WBAT to RLE - Will sign off at this time, pt will follow up as previously scheduled with Dr. Posey Pronto. Please reconsult if wound worsens. No debridement indicauted at this time.    Thank you for the consult.  Please contact me directly with any questions or concerns.           Everitt Amber, DPM [facility]    [address], [place] [address]                Office [phone]  Fax [phone]

## 2022-05-30 ENCOUNTER — Ambulatory Visit (HOSPITAL_COMMUNITY): Admission: RE | Admit: 2022-05-30 | Payer: No Typology Code available for payment source | Source: Ambulatory Visit

## 2022-05-30 DIAGNOSIS — I739 Peripheral vascular disease, unspecified: Secondary | ICD-10-CM | POA: Diagnosis not present

## 2022-05-30 DIAGNOSIS — N189 Chronic kidney disease, unspecified: Secondary | ICD-10-CM | POA: Diagnosis not present

## 2022-05-30 DIAGNOSIS — I5023 Acute on chronic systolic (congestive) heart failure: Secondary | ICD-10-CM

## 2022-05-30 DIAGNOSIS — E1169 Type 2 diabetes mellitus with other specified complication: Secondary | ICD-10-CM

## 2022-05-30 DIAGNOSIS — N179 Acute kidney failure, unspecified: Secondary | ICD-10-CM | POA: Diagnosis not present

## 2022-05-30 DIAGNOSIS — E785 Hyperlipidemia, unspecified: Secondary | ICD-10-CM

## 2022-05-30 LAB — COMPREHENSIVE METABOLIC PANEL
ALT: 34 U/L (ref 0–44)
AST: 27 U/L (ref 15–41)
Albumin: 3.2 g/dL — ABNORMAL LOW (ref 3.5–5.0)
Alkaline Phosphatase: 128 U/L — ABNORMAL HIGH (ref 38–126)
Anion gap: 9 (ref 5–15)
BUN: 57 mg/dL — ABNORMAL HIGH (ref 8–23)
CO2: 30 mmol/L (ref 22–32)
Calcium: 8.6 mg/dL — ABNORMAL LOW (ref 8.9–10.3)
Chloride: 94 mmol/L — ABNORMAL LOW (ref 98–111)
Creatinine, Ser: 2.11 mg/dL — ABNORMAL HIGH (ref 0.61–1.24)
GFR, Estimated: 33 mL/min — ABNORMAL LOW (ref >60)
Glucose, Bld: 160 mg/dL — ABNORMAL HIGH (ref 70–99)
Potassium: 3.9 mmol/L (ref 3.5–5.1)
Sodium: 133 mmol/L — ABNORMAL LOW (ref 135–145)
Total Bilirubin: 0.6 mg/dL (ref 0.3–1.2)
Total Protein: 7.4 g/dL (ref 6.5–8.1)

## 2022-05-30 LAB — CBC
HCT: 45.3 % (ref 39.0–52.0)
Hemoglobin: 14.7 g/dL (ref 13.0–17.0)
MCH: 26.9 pg (ref 26.0–34.0)
MCHC: 32.5 g/dL (ref 30.0–36.0)
MCV: 83 fL (ref 80.0–100.0)
Platelets: 324 10*3/uL (ref 150–400)
RBC: 5.46 MIL/uL (ref 4.22–5.81)
RDW: 15 % (ref 11.5–15.5)
WBC: 6.6 10*3/uL (ref 4.0–10.5)
nRBC: 0 % (ref 0.0–0.2)

## 2022-05-30 LAB — GLUCOSE, CAPILLARY
Glucose-Capillary: 133 mg/dL — ABNORMAL HIGH (ref 70–99)
Glucose-Capillary: 180 mg/dL — ABNORMAL HIGH (ref 70–99)
Glucose-Capillary: 112 mg/dL — ABNORMAL HIGH (ref 70–99)
Glucose-Capillary: 309 mg/dL — ABNORMAL HIGH (ref 70–99)

## 2022-05-30 NOTE — Progress Notes (Signed)
[facility]                                                                              Caleb [name], is a 72 y.o. male, DOB - [birth year], [id number] Admit date - 05/27/2022    Outpatient Primary MD for the patient is Caleb Rakes, MD  LOS - 3  days  Chief Complaint  Patient presents with   Shortness of Breath       Brief summary   Caleb [name] is a 72 y.o. male with PMH significant of chronic systolic CHF, EF 35 to [id number], G1 DD per 2D echo on 02/16/2022, diabetes mellitus type 2, GERD, HTN, HLP, diabetic neuropathy, aortic stenosis, TAVR in 12/2021, CKD stage IIIb presented to ED with shortness of breath, nausea and vomiting.  Patient reported that shortness of breath has been going on for at least a week, no chest pain, no significant peripheral edema.  This morning he woke up and started having nausea and vomiting with 4 episodes of emesis.  No hematemesis.  No abdominal pain or diarrhea.  He has right heel ulcer and has been seeing podiatry.  Patient reported that he had started tramadol and also has been taking a large amount of ibuprofen for the pain in the last week. Of note, patient was seen in cardiology office on 05/19/2022 and had recently increased his Lasix to 80 mg daily.   ED course:  In ED, temp 97.6, respiratory rate 17, pulse 91, BP 161/113, O2 sats 100% on room air Sodium 133, potassium 5.2, CO2 22, BUN 53, creatinine 2.62.  Creatinine was 1.8 on 05/12/2022.  Baseline creatinine 1.2-1.3   Assessment & Plan   Principal Problem:   Acute kidney injury superimposed on chronic kidney disease 3B ([place]) -Baseline creatinine 1.2-1.3, most recently 1.8 on 05/12/2022.  On admission 2.62, BUN 53.   - Cr worsened acutely due to  NSAID's, nausea vomiting, intravascular depletion with dehydration, recently increased Lasix to 80 mg daily, and Entresto -Appreciate cardiology and nephrology assistance -Continue IV Lasix 80 mg twice daily, negative balance of 9.6  L with excellent diuresis.  -Creatinine stable and improving 2.1 today   Active Problems:   Acute on chronic systolic and diastolic CHF (congestive heart failure) ([place]), history of CAD -2D echo 01/2022 had shown EF of 35 to 40%, G1 DD, stable TAVR -Chest x-ray with pulmonary edema, elevated BNP > 4500, JVD -Appreciate cardiology and nephrology recommendations.  Started on IV Lasix with symptomatic improvement -2D echo showed EF 25 to 30%, global hypokinesis, G2 DD  -Continue IV Lasix 80 mg twice daily     Hypertensive urgency -BP stable, continue Coreg, Lasix, Imdur -Entresto on hold   Nausea vomiting, dehydration -Now improving, likely due to tramadol,  -currently no nausea or vomiting, tolerating diet   Hyperkalemia Resolved     Peripheral vascular disease ([place]), carotid artery stenosis s/p bilateral TCAR -Continue Plavix, statin -Per patient, he is being worked up for upcoming vascular surgeries for PAD. - ABI's completed with moderate PAD on the right LE arterial disease, mild on the left -Patient has an appointment with Dr. Gwenlyn Found on  06/08/2022 for further management     Type 2 diabetes mellitus with hyperlipidemia ([place]) -Hemoglobin A1c 7.9 on 04/25/2022. Hold empagliflozin- metformin -Continue moderate SSI, Semglee 10 units daily, and NovoLog meal coverage 3 units TID AC CBG (last 3)  Recent Labs    05/29/22 2041 05/30/22 0751 05/30/22 1120  GLUCAP 190* 133* 180*        Hyperlipidemia -Continue Lipitor     Diabetic neuropathy ([facility]) -Hold Neurontin due to AKI     Decubitus ulcer of right heel -Appears to have dry ulcer with eschar on the right heel, no gangrene or drainage -Per patient he was taking excessive ibuprofen for the right heel ulcer and was been placed on tramadol by podiatry, Dr. Posey Pronto.   -Tramadol caused nausea and vomiting hence discontinued -Appreciate podiatry recommendations, wound care on the right heel.  Patient has appointment with his  podiatrist, Dr. Posey Pronto on 06/07/2022. -ABIs completed, outpatient vascular workup with Dr. Gwenlyn Found, appointment on 06/08/2022    Severe AS status post TAVR in 10/23 -2D echo showed stable TAVR  Estimated body mass index is 28.21 kg/m as calculated from the following:   Height as of this encounter: '5\' 7"'$  (1.702 m).   Weight as of this encounter: 81.7 kg.  Code Status: Full code DVT Prophylaxis:  enoxaparin (LOVENOX) injection 30 mg Start: 05/27/22 2200   Level of Care: Level of care: Telemetry Family Communication: Updated patient Disposition Plan:      Remains inpatient appropriate:     Procedures:  2D echo  Consultants:   Cardiology Nephrology Podiatry  Antimicrobials: None    Medications  aspirin EC  81 mg Oral Daily   atorvastatin  40 mg Oral Daily   carvedilol  12.5 mg Oral BID WC   clopidogrel  75 mg Oral Daily   enoxaparin (LOVENOX) injection  30 mg Subcutaneous Q24H   furosemide  80 mg Intravenous BID   insulin aspart  0-15 Units Subcutaneous TID WC   insulin aspart  0-5 Units Subcutaneous QHS   insulin aspart  3 Units Subcutaneous TID WC   insulin glargine-yfgn  10 Units Subcutaneous Daily   isosorbide mononitrate  30 mg Oral Daily   pantoprazole  40 mg Oral Q0600   polyethylene glycol  17 g Oral Daily   sodium chloride flush  3 mL Intravenous Q12H   cyanocobalamin  500 mcg Oral Daily      Subjective:   Caleb [name] was seen and examined today.  Feeling a lot better, on Lasix IV.   Feels shortness of breath has improved significantly.  No chest pain, nausea or vomiting.  Tolerating diet.   Objective:   Vitals:   05/29/22 1313 05/29/22 1746 05/29/22 2039 05/30/22 0539  BP: 104/70 113/84 104/75 116/82  Pulse: 80 77 81 78  Resp: '16  19 19  '$ Temp: (!) 97.3 F (36.3 C)  98.7 F (37.1 C) 98.7 F (37.1 C)  TempSrc: Oral   Oral  SpO2: 96%  98% 98%  Weight:      Height:        Intake/Output Summary (Last 24 hours) at 05/30/2022 1141 Last data filed  at 05/30/2022 1042 Gross per 24 hour  Intake 600 ml  Output 3875 ml  Net -3275 ml     Wt Readings from Last 3 Encounters:  05/29/22 81.7 kg  05/19/22 81.9 kg  05/13/22 81.2 kg   Physical Exam General: Alert and oriented x 3, NAD Cardiovascular: S1 S2 clear, RRR.  Respiratory: Bibasilar  crackles Gastrointestinal: Soft, nontender, nondistended, NBS Ext: no pedal edema bilaterally, right heel and the heel protector Neuro: no new deficits Psych: Normal affect     Data Reviewed:  I have personally reviewed following labs    CBC Lab Results  Component Value Date   WBC 6.6 05/30/2022   RBC 5.46 05/30/2022   HGB 14.7 05/30/2022   HCT 45.3 05/30/2022   MCV 83.0 05/30/2022   MCH 26.9 05/30/2022   PLT 324 05/30/2022   MCHC 32.5 05/30/2022   RDW 15.0 05/30/2022   LYMPHSABS 1.1 05/27/2022   MONOABS 0.5 05/27/2022   EOSABS 0.0 05/27/2022   BASOSABS 0.0 [id number]     Last metabolic panel Lab Results  Component Value Date   NA 133 (L) 05/30/2022   K 3.9 05/30/2022   CL 94 (L) 05/30/2022   CO2 30 05/30/2022   BUN 57 (H) 05/30/2022   CREATININE 2.11 (H) 05/30/2022   GLUCOSE 160 (H) 05/30/2022   GFRNONAA 33 (L) 05/30/2022   GFRAA >60 12/17/2019   CALCIUM 8.6 (L) 05/30/2022   PROT 7.4 05/30/2022   ALBUMIN 3.2 (L) 05/30/2022   LABGLOB 3.2 03/09/2022   AGRATIO 1.0 (L) 01/19/2021   BILITOT 0.6 05/30/2022   ALKPHOS 128 (H) 05/30/2022   AST 27 05/30/2022   ALT 34 05/30/2022   ANIONGAP 9 05/30/2022    CBG (last 3)  Recent Labs    05/29/22 2041 05/30/22 0751 05/30/22 1120  GLUCAP 190* 133* 180*      Coagulation Profile: No results for input(s): "INR", "PROTIME" in the last 168 hours.   Radiology Studies: I have personally reviewed the imaging studies  VAS [place] ABI WITH/WO TBI  Result Date: 05/29/2022  [facility] STUDY Patient Name:  Caleb [name]  Date of Exam:   05/29/2022 Medical Rec #: [id number]        Accession #:    [id number] Date of  Birth: 1950-10-11         Patient Gender: M Patient Age:   69 years Exam Location:  [facility] Procedure:      VAS [place] ABI WITH/WO TBI Referring Phys: Miquel Stacks --------------------------------------------------------------------------------  Indications: Peripheral artery disease. High Risk Factors: Hyperlipidemia, Diabetes.  Comparison Study: 06/03/2020 - Right: Resting right ankle-brachial index is                   within normal range. No                   evidence of significant right lower extremity arterial                   disease. The right                   toe-brachial index is normal.                    Left: Resting left ankle-brachial index indicates moderate                   left lower                   extremity arterial disease. The left toe-brachial index is                   abnormal.                    05/19/2021 - Right: Resting right ankle-brachial index indicates  moderate right lower                   extremity arterial disease. The right toe-brachial index is                   abnormal.                    Left: Resting left ankle-brachial index indicates moderate                   left lower                   extremity arterial disease. The left toe-brachial index is                   abnormal. Performing Technologist: Carlos Levering RVT  Examination Guidelines: A complete evaluation includes at minimum, Doppler waveform signals and systolic blood pressure reading at the level of bilateral brachial, anterior tibial, and posterior tibial arteries, when vessel segments are accessible. Bilateral testing is considered an integral part of a complete examination. Photoelectric Plethysmograph (PPG) waveforms and toe systolic pressure readings are included as required and additional duplex testing as needed. Limited examinations for reoccurring indications may be performed as noted.  ABI Findings: +--------+------------------+-----+----------+--------+ Right   Rt Pressure  (mmHg)IndexWaveform  Comment  +--------+------------------+-----+----------+--------+ [id number]                    triphasic          +--------+------------------+-----+----------+--------+ PTA     88                0.71 monophasic         +--------+------------------+-----+----------+--------+ DP      74                0.60 monophasic         +--------+------------------+-----+----------+--------+ +---------+------------------+-----+----------+-------+ Left     Lt Pressure (mmHg)IndexWaveform  Comment +---------+------------------+-----+----------+-------+ Brachial 116                    triphasic         +---------+------------------+-----+----------+-------+ PTA      116               0.94 monophasic        +---------+------------------+-----+----------+-------+ DP       91                0.73 monophasic        +---------+------------------+-----+----------+-------+ Great Toe54                0.44                   +---------+------------------+-----+----------+-------+ +-------+-----------+-----------+------------+------------+ ABI/TBIToday's ABIToday's TBIPrevious ABIPrevious TBI +-------+-----------+-----------+------------+------------+ Right  0.71                  0.63        0.62         +-------+-----------+-----------+------------+------------+ Left   0.94       0.44       0.76        0.66         +-------+-----------+-----------+------------+------------+  Summary: Right: Resting right ankle-brachial index indicates moderate right lower extremity arterial disease. Unable to obtain TBI due to low amplitude/absent waveforms. Left: Resting left ankle-brachial index indicates mild left lower extremity arterial disease. The left toe-brachial index is abnormal. ABI's are likely falsely elevated due  to medial calcification. *See table(s) above for measurements and observations.  Electronically signed by Jamelle Haring on 05/29/2022 at 4:33:22  PM.    Final    DG CHEST PORT 1 VIEW  Result Date: 05/29/2022 CLINICAL DATA:  72 year old male with history of pulmonary edema. EXAM: PORTABLE CHEST 1 VIEW COMPARISON:  Chest x-ray 05/2022. FINDINGS: Lung volumes are normal. No consolidative airspace disease. No pleural effusions. No pneumothorax. No evidence of pulmonary edema. Heart size is mildly enlarged. Upper mediastinal contours are within normal limits. Status post TAVR. IMPRESSION: 1. No radiographic evidence of acute cardiopulmonary disease. 2. Mild cardiomegaly (unchanged). 3. Status post TAVR. Electronically Signed   By: Vinnie Langton M.D.   On: 05/29/2022 10:06   ECHOCARDIOGRAM COMPLETE  Result Date: 05/28/2022    ECHOCARDIOGRAM REPORT   Patient Name:   Caleb [name] Date of Exam: 05/28/2022 Medical Rec #:  [id number]       Height:       67.0 in Accession #:    [id number]      Weight:       185.0 lb Date of Birth:  May 06, 1950        BSA:          1.956 m Patient Age:    4 years        BP:           114/86 mmHg Patient Gender: M               HR:           92 bpm. Exam Location:  Inpatient Procedure: 2D Echo, Color Doppler and Cardiac Doppler Indications:    dyspnea  History:        Patient has prior history of Echocardiogram examinations, most                 recent 02/16/2022. CHF, PVD. chronic kidney disease.; Risk                 Factors:Diabetes, Hypertension and Dyslipidemia.                 Aortic Valve: 26 mm Sapien prosthetic, stented (TAVR) valve is                 present in the aortic position. Procedure Date: 01/11/22.  Sonographer:    Johny Chess RDCS Referring Phys: [id number] [place]  1. Left ventricular ejection fraction, by estimation, is 25 to 30%. Left ventricular ejection fraction by 2D MOD biplane is 26.8 %. The left ventricle has severely decreased function. The left ventricle demonstrates global hypokinesis. There is mild left ventricular hypertrophy. Left ventricular diastolic parameters are  consistent with Grade II diastolic dysfunction (pseudonormalization). Elevated left ventricular end-diastolic pressure. The E/e' is 69. There is severe akinesis of the left ventricular, mid-apical anteroseptal wall, septal wall, anterior wall, inferoapical segment and anterolateral wall.  2. Right ventricular systolic function is mildly reduced. The right ventricular size is normal. There is moderately elevated pulmonary artery systolic pressure. The estimated right ventricular systolic pressure is 0000000 mmHg.  3. Left atrial size was mildly dilated.  4. The mitral valve is abnormal. Mild mitral valve regurgitation.  5. The aortic valve has been repaired/replaced. Aortic valve regurgitation is not visualized. There is a 26 mm Sapien prosthetic (TAVR) valve present in the aortic position. Procedure Date: 01/11/22. Aortic valve area, by VTI measures 1.03 cm. Aortic valve mean gradient measures 8.5 mmHg. Aortic valve Vmax measures 2.05 m/s.  6. The inferior vena cava is dilated in size with <50% respiratory variability, suggesting right atrial pressure of 15 mmHg. Comparison(s): Changes from prior study are noted. 01/12/2022: LVEF 20-25%, global HK, TAVR MG 5.7 mmHg. FINDINGS  Left Ventricle: Left ventricular ejection fraction, by estimation, is 25 to 30%. Left ventricular ejection fraction by 2D MOD biplane is 26.8 %. The left ventricle has severely decreased function. The left ventricle demonstrates global hypokinesis. Severe akinesis of the left ventricular, mid-apical anteroseptal wall, septal wall, anterior wall, inferoapical segment and anterolateral wall. The left ventricular internal cavity size was normal in size. There is mild left ventricular hypertrophy. Left  ventricular diastolic parameters are consistent with Grade II diastolic dysfunction (pseudonormalization). Elevated left ventricular end-diastolic pressure. The E/e' is 50.  LV Wall Scoring: The mid and distal anterior septum, entire apex, and mid  inferoseptal segment are akinetic. Right Ventricle: The right ventricular size is normal. No increase in right ventricular wall thickness. Right ventricular systolic function is mildly reduced. There is moderately elevated pulmonary artery systolic pressure. The tricuspid regurgitant velocity is 3.24 m/s, and with an assumed right atrial pressure of 15 mmHg, the estimated right ventricular systolic pressure is 0000000 mmHg. Left Atrium: Left atrial size was mildly dilated. Right Atrium: Right atrial size was normal in size. Pericardium: There is no evidence of pericardial effusion. Mitral Valve: The mitral valve is abnormal. There is mild calcification of the posterior mitral valve leaflet(s). Mild to moderate mitral annular calcification. Mild mitral valve regurgitation. MV peak gradient, 6.4 mmHg. The mean mitral valve gradient is 3.0 mmHg. Tricuspid Valve: The tricuspid valve is grossly normal. Tricuspid valve regurgitation is mild. Aortic Valve: The aortic valve has been repaired/replaced. Aortic valve regurgitation is not visualized. Aortic valve mean gradient measures 8.5 mmHg. Aortic valve peak gradient measures 16.9 mmHg. Aortic valve area, by VTI measures 1.03 cm. There is a 26 mm Sapien prosthetic, stented (TAVR) valve present in the aortic position. Procedure Date: 01/11/22. Pulmonic Valve: The pulmonic valve was grossly normal. Pulmonic valve regurgitation is trivial. Aorta: The aortic root and ascending aorta are structurally normal, with no evidence of dilitation. Venous: The inferior vena cava is dilated in size with less than 50% respiratory variability, suggesting right atrial pressure of 15 mmHg. IAS/Shunts: No atrial level shunt detected by color flow Doppler.  LEFT VENTRICLE PLAX 2D                        Biplane EF (MOD) LVIDd:         5.10 cm         LV Biplane EF:   Left LVIDs:         4.40 cm                          ventricular LV PW:         1.10 cm                          ejection LV IVS:         1.10 cm                          fraction by LVOT diam:     1.70 cm  2D MOD LV SV:         38                               biplane is LV SV Index:   19                               26.8 %. LVOT Area:     2.27 cm                                Diastology                                LV e' medial:    4.24 cm/s LV Volumes (MOD)               LV E/e' medial:  25.7 LV vol d, MOD    195.5 ml      LV e' lateral:   5.22 cm/s A2C:                           LV E/e' lateral: 20.9 LV vol d, MOD    153.1 ml A4C: LV vol s, MOD    145.0 ml A2C: LV vol s, MOD    128.6 ml A4C: LV SV MOD A2C:   50.5 ml LV SV MOD A4C:   153.1 ml LV SV MOD BP:    47.8 ml RIGHT VENTRICLE            IVC RV Basal diam:  2.90 cm    IVC diam: 2.60 cm RV S prime:     9.57 cm/s TAPSE (M-mode): 0.8 cm LEFT ATRIUM             Index        RIGHT ATRIUM           Index LA diam:        4.00 cm 2.04 cm/m   RA Area:     15.80 cm LA Vol (A2C):   70.5 ml 36.04 ml/m  RA Volume:   42.70 ml  21.83 ml/m LA Vol (A4C):   59.3 ml 30.31 ml/m LA Biplane Vol: 70.2 ml 35.89 ml/m  AORTIC VALVE AV Area (Vmax):    1.02 cm AV Area (Vmean):   1.02 cm AV Area (VTI):     1.03 cm AV Vmax:           205.50 cm/s AV Vmean:          134.000 cm/s AV VTI:            0.364 m AV Peak Grad:      16.9 mmHg AV Mean Grad:      8.5 mmHg LVOT Vmax:         92.20 cm/s LVOT Vmean:        60.450 cm/s LVOT VTI:          0.166 m LVOT/AV VTI ratio: 0.45  AORTA Ao Root diam: 3.30 cm Ao Asc diam:  3.10 cm MITRAL VALVE                TRICUSPID VALVE MV Area (PHT): 5.66 cm     TR Peak grad:   42.0  mmHg MV Area VTI:   1.42 cm     TR Vmax:        324.00 cm/s MV Peak grad:  6.4 mmHg MV Mean grad:  3.0 mmHg     SHUNTS MV Vmax:       1.26 m/s     Systemic VTI:  0.17 m MV Vmean:      87.4 cm/s    Systemic Diam: 1.70 cm MV Decel Time: 134 msec MV E velocity: 109.00 cm/s MV A velocity: 85.90 cm/s MV E/A ratio:  1.27 Lyman Bishop MD Electronically signed by Lyman Bishop MD  Signature Date/Time: 05/28/2022/6:48:53 PM    Final        Estill Cotta M.D. [facility] 05/30/2022, 11:41 AM  Available via Epic secure chat 7am-7pm After 7 pm, please refer to night coverage provider listed on amion.
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
## 2022-05-30 NOTE — Progress Notes (Signed)
Heart Failure Navigator Progress Note  Assessed for Heart & Vascular TOC clinic readiness.  Patient does not meet criteria due to Advanced Heart Failure patient of Dr. Sabharwal.   Navigator will sign off at this time.   Susane Bey, BSN, RN Heart Failure Nurse Navigator Secure Chat Only   

## 2022-05-30 NOTE — Progress Notes (Signed)
Rounding Note    Patient Name: Caleb [name] Date of Encounter: 05/30/2022  [place] Cardiologist: Quay Burow, MD   Subjective   Feeling so much better.  Great urine output.  Creatinine is decreasing.  Lets continue.  He would like to be in [place] on Thursday to visit his 31-monthold great grandson.  Inpatient Medications    Scheduled Meds:  aspirin EC  81 mg Oral Daily   atorvastatin  40 mg Oral Daily   carvedilol  12.5 mg Oral BID WC   clopidogrel  75 mg Oral Daily   enoxaparin (LOVENOX) injection  30 mg Subcutaneous Q24H   furosemide  80 mg Intravenous BID   insulin aspart  0-15 Units Subcutaneous TID WC   insulin aspart  0-5 Units Subcutaneous QHS   insulin aspart  3 Units Subcutaneous TID WC   insulin glargine-yfgn  10 Units Subcutaneous Daily   isosorbide mononitrate  30 mg Oral Daily   pantoprazole  40 mg Oral Q0600   polyethylene glycol  17 g Oral Daily   sodium chloride flush  3 mL Intravenous Q12H   cyanocobalamin  500 mcg Oral Daily   Continuous Infusions:  sodium chloride     PRN Meds: sodium chloride, acetaminophen, fentaNYL (SUBLIMAZE) injection, hydrALAZINE, ondansetron (ZOFRAN) IV, mouth rinse, sodium chloride flush   Vital Signs    Vitals:   05/29/22 1313 05/29/22 1746 05/29/22 2039 05/30/22 0539  BP: 104/70 113/84 104/75 116/82  Pulse: 80 77 81 78  Resp: '16  19 19  '$ Temp: (!) 97.3 F (36.3 C)  98.7 F (37.1 C) 98.7 F (37.1 C)  TempSrc: Oral   Oral  SpO2: 96%  98% 98%  Weight:      Height:        Intake/Output Summary (Last 24 hours) at 05/30/2022 1109 Last data filed at 05/30/2022 1042 Gross per 24 hour  Intake 600 ml  Output 5475 ml  Net -4875 ml      05/29/2022    9:04 AM 05/29/2022    5:00 AM 05/28/2022    6:17 AM  Last 3 Weights  Weight (lbs) 180 lb 1.9 oz 177 lb 11.1 oz 184 lb 15.5 oz  Weight (kg) 81.7 kg 80.6 kg 83.9 kg      Telemetry    Currently sinus rhythm.  Brief period of sinus tachycardia.  No  adverse arrhythmias.- Personally Reviewed  ECG    No new- Personally Reviewed  Physical Exam   GEN: No acute distress.   Neck: No JVD Cardiac: RRR, no murmurs, rubs, or gallops.  Respiratory: Clear to auscultation bilaterally. GI: Soft, nontender, non-distended  MS: Improved edema; right boot in place on podiatry. Neuro:  Nonfocal  Psych: Normal affect   Labs    High Sensitivity Troponin:   Recent Labs  Lab 05/27/22 1513 05/27/22 1900  TROPONINIHS 59* 65*     Chemistry Recent Labs  Lab 05/28/22 0429 05/28/22 0910 05/29/22 0441 05/30/22 0340  NA 131*  --  134* 133*  K 4.6  --  4.5 3.9  CL 98  --  95* 94*  CO2 23  --  30 30  GLUCOSE 163*  --  154* 160*  BUN 62*  --  60* 57*  CREATININE 2.77*  --  2.52* 2.11*  CALCIUM 8.0*  --  8.3* 8.6*  PROT 6.2* 6.7 7.2 7.4  ALBUMIN 2.8* 3.0* 3.1* 3.2*  AST 28 36 26 27  ALT 39 39 36 34  ALKPHOS  131* 135* 129* 128*  BILITOT 0.7 0.6 0.5 0.6  GFRNONAA 24*  --  26* 33*  ANIONGAP 10  --  9 9    Lipids No results for input(s): "CHOL", "TRIG", "HDL", "LABVLDL", "LDLCALC", "CHOLHDL" in the last 168 hours.  Hematology Recent Labs  Lab 05/28/22 0429 05/29/22 0441 05/30/22 0340  WBC 7.5 7.6 6.6  RBC 4.56 5.13 5.46  HGB 11.9* 14.1 14.7  HCT 37.6* 42.7 45.3  MCV 82.5 83.2 83.0  MCH 26.1 27.5 26.9  MCHC 31.6 33.0 32.5  RDW 15.3 15.2 15.0  PLT 283 304 324   Thyroid No results for input(s): "TSH", "FREET4" in the last 168 hours.  BNP Recent Labs  Lab 05/27/22 1513  BNP >4,500.0*    DDimer No results for input(s): "DDIMER" in the last 168 hours.   Radiology    VAS [place] ABI WITH/WO TBI  Result Date: 05/29/2022  LOWER EXTREMITY DOPPLER STUDY Patient Name:  DRAYDEN CLECKLER  Date of Exam:   05/29/2022 Medical Rec #: [id number]        Accession #:    [id number] Date of Birth: 05-22-1950         Patient Gender: M Patient Age:   59 years Exam Location:  [facility] Procedure:      VAS [place] ABI WITH/WO TBI Referring Phys:  RIPUDEEP RAI --------------------------------------------------------------------------------  Indications: Peripheral artery disease. High Risk Factors: Hyperlipidemia, Diabetes.  Comparison Study: 06/03/2020 - Right: Resting right ankle-brachial index is                   within normal range. No                   evidence of significant right lower extremity arterial                   disease. The right                   toe-brachial index is normal.                    Left: Resting left ankle-brachial index indicates moderate                   left lower                   extremity arterial disease. The left toe-brachial index is                   abnormal.                    05/19/2021 - Right: Resting right ankle-brachial index indicates                   moderate right lower                   extremity arterial disease. The right toe-brachial index is                   abnormal.                    Left: Resting left ankle-brachial index indicates moderate                   left lower                   extremity arterial disease. The left toe-brachial  index is                   abnormal. Performing Technologist: Carlos Levering RVT  Examination Guidelines: A complete evaluation includes at minimum, Doppler waveform signals and systolic blood pressure reading at the level of bilateral brachial, anterior tibial, and posterior tibial arteries, when vessel segments are accessible. Bilateral testing is considered an integral part of a complete examination. Photoelectric Plethysmograph (PPG) waveforms and toe systolic pressure readings are included as required and additional duplex testing as needed. Limited examinations for reoccurring indications may be performed as noted.  ABI Findings: +--------+------------------+-----+----------+--------+ Right   Rt Pressure (mmHg)IndexWaveform  Comment  +--------+------------------+-----+----------+--------+ [id number]                    triphasic           +--------+------------------+-----+----------+--------+ PTA     88                0.71 monophasic         +--------+------------------+-----+----------+--------+ DP      74                0.60 monophasic         +--------+------------------+-----+----------+--------+ +---------+------------------+-----+----------+-------+ Left     Lt Pressure (mmHg)IndexWaveform  Comment +---------+------------------+-----+----------+-------+ Brachial 116                    triphasic         +---------+------------------+-----+----------+-------+ PTA      116               0.94 monophasic        +---------+------------------+-----+----------+-------+ DP       91                0.73 monophasic        +---------+------------------+-----+----------+-------+ Great Toe54                0.44                   +---------+------------------+-----+----------+-------+ +-------+-----------+-----------+------------+------------+ ABI/TBIToday's ABIToday's TBIPrevious ABIPrevious TBI +-------+-----------+-----------+------------+------------+ Right  0.71                  0.63        0.62         +-------+-----------+-----------+------------+------------+ Left   0.94       0.44       0.76        0.66         +-------+-----------+-----------+------------+------------+  Summary: Right: Resting right ankle-brachial index indicates moderate right lower extremity arterial disease. Unable to obtain TBI due to low amplitude/absent waveforms. Left: Resting left ankle-brachial index indicates mild left lower extremity arterial disease. The left toe-brachial index is abnormal. ABI's are likely falsely elevated due to medial calcification. *See table(s) above for measurements and observations.  Electronically signed by Jamelle Haring on 05/29/2022 at 4:33:22 PM.    Final    DG CHEST PORT 1 VIEW  Result Date: 05/29/2022 CLINICAL DATA:  72 year old male with history of pulmonary edema. EXAM:  PORTABLE CHEST 1 VIEW COMPARISON:  Chest x-ray 05/2022. FINDINGS: Lung volumes are normal. No consolidative airspace disease. No pleural effusions. No pneumothorax. No evidence of pulmonary edema. Heart size is mildly enlarged. Upper mediastinal contours are within normal limits. Status post TAVR. IMPRESSION: 1. No radiographic evidence of acute cardiopulmonary disease. 2. Mild cardiomegaly (unchanged). 3. Status post TAVR. Electronically Signed   By: Mauri Brooklyn.D.  On: 05/29/2022 10:06   ECHOCARDIOGRAM COMPLETE  Result Date: 05/28/2022    ECHOCARDIOGRAM REPORT   Patient Name:   COLBEN DENNO Date of Exam: 05/28/2022 Medical Rec #:  [id number]       Height:       67.0 in Accession #:    [id number]      Weight:       185.0 lb Date of Birth:  09/19/1950        BSA:          1.956 m Patient Age:    19 years        BP:           114/86 mmHg Patient Gender: M               HR:           92 bpm. Exam Location:  Inpatient Procedure: 2D Echo, Color Doppler and Cardiac Doppler Indications:    dyspnea  History:        Patient has prior history of Echocardiogram examinations, most                 recent 02/16/2022. CHF, PVD. chronic kidney disease.; Risk                 Factors:Diabetes, Hypertension and Dyslipidemia.                 Aortic Valve: 26 mm Sapien prosthetic, stented (TAVR) valve is                 present in the aortic position. Procedure Date: 01/11/22.  Sonographer:    Johny Chess RDCS Referring Phys: [id number] [place]  1. Left ventricular ejection fraction, by estimation, is 25 to 30%. Left ventricular ejection fraction by 2D MOD biplane is 26.8 %. The left ventricle has severely decreased function. The left ventricle demonstrates global hypokinesis. There is mild left ventricular hypertrophy. Left ventricular diastolic parameters are consistent with Grade II diastolic dysfunction (pseudonormalization). Elevated left ventricular end-diastolic pressure. The E/e' is 68. There  is severe akinesis of the left ventricular, mid-apical anteroseptal wall, septal wall, anterior wall, inferoapical segment and anterolateral wall.  2. Right ventricular systolic function is mildly reduced. The right ventricular size is normal. There is moderately elevated pulmonary artery systolic pressure. The estimated right ventricular systolic pressure is 0000000 mmHg.  3. Left atrial size was mildly dilated.  4. The mitral valve is abnormal. Mild mitral valve regurgitation.  5. The aortic valve has been repaired/replaced. Aortic valve regurgitation is not visualized. There is a 26 mm Sapien prosthetic (TAVR) valve present in the aortic position. Procedure Date: 01/11/22. Aortic valve area, by VTI measures 1.03 cm. Aortic valve mean gradient measures 8.5 mmHg. Aortic valve Vmax measures 2.05 m/s.  6. The inferior vena cava is dilated in size with <50% respiratory variability, suggesting right atrial pressure of 15 mmHg. Comparison(s): Changes from prior study are noted. 01/12/2022: LVEF 20-25%, global HK, TAVR MG 5.7 mmHg. FINDINGS  Left Ventricle: Left ventricular ejection fraction, by estimation, is 25 to 30%. Left ventricular ejection fraction by 2D MOD biplane is 26.8 %. The left ventricle has severely decreased function. The left ventricle demonstrates global hypokinesis. Severe akinesis of the left ventricular, mid-apical anteroseptal wall, septal wall, anterior wall, inferoapical segment and anterolateral wall. The left ventricular internal cavity size was normal in size. There is mild left ventricular hypertrophy. Left  ventricular diastolic parameters are consistent with Grade II diastolic  dysfunction (pseudonormalization). Elevated left ventricular end-diastolic pressure. The E/e' is 18.  LV Wall Scoring: The mid and distal anterior septum, entire apex, and mid inferoseptal segment are akinetic. Right Ventricle: The right ventricular size is normal. No increase in right ventricular wall thickness. Right  ventricular systolic function is mildly reduced. There is moderately elevated pulmonary artery systolic pressure. The tricuspid regurgitant velocity is 3.24 m/s, and with an assumed right atrial pressure of 15 mmHg, the estimated right ventricular systolic pressure is 0000000 mmHg. Left Atrium: Left atrial size was mildly dilated. Right Atrium: Right atrial size was normal in size. Pericardium: There is no evidence of pericardial effusion. Mitral Valve: The mitral valve is abnormal. There is mild calcification of the posterior mitral valve leaflet(s). Mild to moderate mitral annular calcification. Mild mitral valve regurgitation. MV peak gradient, 6.4 mmHg. The mean mitral valve gradient is 3.0 mmHg. Tricuspid Valve: The tricuspid valve is grossly normal. Tricuspid valve regurgitation is mild. Aortic Valve: The aortic valve has been repaired/replaced. Aortic valve regurgitation is not visualized. Aortic valve mean gradient measures 8.5 mmHg. Aortic valve peak gradient measures 16.9 mmHg. Aortic valve area, by VTI measures 1.03 cm. There is a 26 mm Sapien prosthetic, stented (TAVR) valve present in the aortic position. Procedure Date: 01/11/22. Pulmonic Valve: The pulmonic valve was grossly normal. Pulmonic valve regurgitation is trivial. Aorta: The aortic root and ascending aorta are structurally normal, with no evidence of dilitation. Venous: The inferior vena cava is dilated in size with less than 50% respiratory variability, suggesting right atrial pressure of 15 mmHg. IAS/Shunts: No atrial level shunt detected by color flow Doppler.  LEFT VENTRICLE PLAX 2D                        Biplane EF (MOD) LVIDd:         5.10 cm         LV Biplane EF:   Left LVIDs:         4.40 cm                          ventricular LV PW:         1.10 cm                          ejection LV IVS:        1.10 cm                          fraction by LVOT diam:     1.70 cm                          2D MOD LV SV:         38                                biplane is LV SV Index:   19                               26.8 %. LVOT Area:     2.27 cm  Diastology                                LV e' medial:    4.24 cm/s LV Volumes (MOD)               LV E/e' medial:  25.7 LV vol d, MOD    195.5 ml      LV e' lateral:   5.22 cm/s A2C:                           LV E/e' lateral: 20.9 LV vol d, MOD    153.1 ml A4C: LV vol s, MOD    145.0 ml A2C: LV vol s, MOD    128.6 ml A4C: LV SV MOD A2C:   50.5 ml LV SV MOD A4C:   153.1 ml LV SV MOD BP:    47.8 ml RIGHT VENTRICLE            IVC RV Basal diam:  2.90 cm    IVC diam: 2.60 cm RV S prime:     9.57 cm/s TAPSE (M-mode): 0.8 cm LEFT ATRIUM             Index        RIGHT ATRIUM           Index LA diam:        4.00 cm 2.04 cm/m   RA Area:     15.80 cm LA Vol (A2C):   70.5 ml 36.04 ml/m  RA Volume:   42.70 ml  21.83 ml/m LA Vol (A4C):   59.3 ml 30.31 ml/m LA Biplane Vol: 70.2 ml 35.89 ml/m  AORTIC VALVE AV Area (Vmax):    1.02 cm AV Area (Vmean):   1.02 cm AV Area (VTI):     1.03 cm AV Vmax:           205.50 cm/s AV Vmean:          134.000 cm/s AV VTI:            0.364 m AV Peak Grad:      16.9 mmHg AV Mean Grad:      8.5 mmHg LVOT Vmax:         92.20 cm/s LVOT Vmean:        60.450 cm/s LVOT VTI:          0.166 m LVOT/AV VTI ratio: 0.45  AORTA Ao Root diam: 3.30 cm Ao Asc diam:  3.10 cm MITRAL VALVE                TRICUSPID VALVE MV Area (PHT): 5.66 cm     TR Peak grad:   42.0 mmHg MV Area VTI:   1.42 cm     TR Vmax:        324.00 cm/s MV Peak grad:  6.4 mmHg MV Mean grad:  3.0 mmHg     SHUNTS MV Vmax:       1.26 m/s     Systemic VTI:  0.17 m MV Vmean:      87.4 cm/s    Systemic Diam: 1.70 cm MV Decel Time: 134 msec MV E velocity: 109.00 cm/s MV A velocity: 85.90 cm/s MV E/A ratio:  1.27 Lyman Bishop MD Electronically signed by Lyman Bishop MD Signature Date/Time: 05/28/2022/6:48:53 PM    Final     Cardiac Studies   Cardiac Studies &  Procedures   CARDIAC CATHETERIZATION  CARDIAC  CATHETERIZATION 10/29/2021  Narrative   Prox RCA lesion is 60% stenosed.   Ramus lesion is 40% stenosed.   2nd Mrg lesion is 60% stenosed.   Mid LAD lesion is 30% stenosed.   Dist LAD lesion is 40% stenosed.  Mild non-obstructive disease in the mid and distal LAD Mild non-obstructive disease in the Circumflex and Ramus intermediate branches The RCA is a large dominant vessel with moderate proximal to mid stenosis that does not appear to be flow limiting. Non-ischemic cardiomyopathy Severe low flow/low gradient aortic stenosis by echo Elevated right heart pressures.  Recommendations: I would recommend medical management of his non-obstructive CAD. He would be a candidate for TAVR or surgical AVR but given low EF, would favor TAVR. I would recommend continued diuresis today and if he feels well tomorrow, he could be discharged home and then come back for outpatient pre-TAVR CT scans. I would not plan the CT scans this weekend given his CKD. Our structural heart team will contact him to arrange the scans and the visit with our CT surgeon.  Findings Coronary Findings Diagnostic  Dominance: Right  Left Anterior Descending Vessel is large. Mid LAD lesion is 30% stenosed. Dist LAD lesion is 40% stenosed.  Ramus Intermedius Ramus lesion is 40% stenosed.  Left Circumflex Vessel is large.  Second Obtuse Marginal Branch 2nd Mrg lesion is 60% stenosed.  Right Coronary Artery Vessel is large. Prox RCA lesion is 60% stenosed.  Intervention  No interventions have been documented.   STRESS TESTS  MYOCARDIAL PERFUSION IMAGING 08/30/2019  Narrative  The left ventricular ejection fraction is moderately decreased (30-44%).  Nuclear stress EF: 38%.  No T wave inversion was noted during stress.  There was no ST segment deviation noted during stress.  Defect 1: There is a medium defect of moderate severity present in the basal inferior, mid inferior and apex location.  This is an  intermediate risk study.  Findings consistent with prior myocardial infarction.  Medium size, moderate intensity fixed inferior perfusion defect, likely scar. LVEF 38% with inferior akinesis. This is an intermediate risk study. No prior study for comparison.   ECHOCARDIOGRAM  ECHOCARDIOGRAM COMPLETE 05/28/2022  Narrative ECHOCARDIOGRAM REPORT    Patient Name:   JAMESMATTHEW RAPPOLD Date of Exam: 05/28/2022 Medical Rec #:  [id number]       Height:       67.0 in Accession #:    [id number]      Weight:       185.0 lb Date of Birth:  09-Jan-1951        BSA:          1.956 m Patient Age:    36 years        BP:           114/86 mmHg Patient Gender: M               HR:           92 bpm. Exam Location:  Inpatient  Procedure: 2D Echo, Color Doppler and Cardiac Doppler  Indications:    dyspnea  History:        Patient has prior history of Echocardiogram examinations, most recent 02/16/2022. CHF, PVD. chronic kidney disease.; Risk Factors:Diabetes, Hypertension and Dyslipidemia. Aortic Valve: 26 mm Sapien prosthetic, stented (TAVR) valve is present in the aortic position. Procedure Date: 01/11/22.  Sonographer:    Johny Chess RDCS Referring Phys: [id number] [place]  1. Left ventricular ejection fraction, by estimation, is 25 to 30%. Left ventricular ejection fraction by 2D MOD biplane is 26.8 %. The left ventricle has severely decreased function. The left ventricle demonstrates global hypokinesis. There is mild left ventricular hypertrophy. Left ventricular diastolic parameters are consistent with Grade II diastolic dysfunction (pseudonormalization). Elevated left ventricular end-diastolic pressure. The E/e' is 22. There is severe akinesis of the left ventricular, mid-apical anteroseptal wall, septal wall, anterior wall, inferoapical segment and anterolateral wall. 2. Right ventricular systolic function is mildly reduced. The right ventricular size is normal. There is  moderately elevated pulmonary artery systolic pressure. The estimated right ventricular systolic pressure is 0000000 mmHg. 3. Left atrial size was mildly dilated. 4. The mitral valve is abnormal. Mild mitral valve regurgitation. 5. The aortic valve has been repaired/replaced. Aortic valve regurgitation is not visualized. There is a 26 mm Sapien prosthetic (TAVR) valve present in the aortic position. Procedure Date: 01/11/22. Aortic valve area, by VTI measures 1.03 cm. Aortic valve mean gradient measures 8.5 mmHg. Aortic valve Vmax measures 2.05 m/s. 6. The inferior vena cava is dilated in size with <50% respiratory variability, suggesting right atrial pressure of 15 mmHg.  Comparison(s): Changes from prior study are noted. 01/12/2022: LVEF 20-25%, global HK, TAVR MG 5.7 mmHg.  FINDINGS Left Ventricle: Left ventricular ejection fraction, by estimation, is 25 to 30%. Left ventricular ejection fraction by 2D MOD biplane is 26.8 %. The left ventricle has severely decreased function. The left ventricle demonstrates global hypokinesis. Severe akinesis of the left ventricular, mid-apical anteroseptal wall, septal wall, anterior wall, inferoapical segment and anterolateral wall. The left ventricular internal cavity size was normal in size. There is mild left ventricular hypertrophy. Left ventricular diastolic parameters are consistent with Grade II diastolic dysfunction (pseudonormalization). Elevated left ventricular end-diastolic pressure. The E/e' is 30.   LV Wall Scoring: The mid and distal anterior septum, entire apex, and mid inferoseptal segment are akinetic.  Right Ventricle: The right ventricular size is normal. No increase in right ventricular wall thickness. Right ventricular systolic function is mildly reduced. There is moderately elevated pulmonary artery systolic pressure. The tricuspid regurgitant velocity is 3.24 m/s, and with an assumed right atrial pressure of 15 mmHg, the estimated right  ventricular systolic pressure is 0000000 mmHg.  Left Atrium: Left atrial size was mildly dilated.  Right Atrium: Right atrial size was normal in size.  Pericardium: There is no evidence of pericardial effusion.  Mitral Valve: The mitral valve is abnormal. There is mild calcification of the posterior mitral valve leaflet(s). Mild to moderate mitral annular calcification. Mild mitral valve regurgitation. MV peak gradient, 6.4 mmHg. The mean mitral valve gradient is 3.0 mmHg.  Tricuspid Valve: The tricuspid valve is grossly normal. Tricuspid valve regurgitation is mild.  Aortic Valve: The aortic valve has been repaired/replaced. Aortic valve regurgitation is not visualized. Aortic valve mean gradient measures 8.5 mmHg. Aortic valve peak gradient measures 16.9 mmHg. Aortic valve area, by VTI measures 1.03 cm. There is a 26 mm Sapien prosthetic, stented (TAVR) valve present in the aortic position. Procedure Date: 01/11/22.  Pulmonic Valve: The pulmonic valve was grossly normal. Pulmonic valve regurgitation is trivial.  Aorta: The aortic root and ascending aorta are structurally normal, with no evidence of dilitation.  Venous: The inferior vena cava is dilated in size with less than 50% respiratory variability, suggesting right atrial pressure of 15 mmHg.  IAS/Shunts: No atrial level shunt detected by color flow Doppler.   LEFT VENTRICLE PLAX 2D  Biplane EF (MOD) LVIDd:         5.10 cm         LV Biplane EF:   Left LVIDs:         4.40 cm                          ventricular LV PW:         1.10 cm                          ejection LV IVS:        1.10 cm                          fraction by LVOT diam:     1.70 cm                          2D MOD LV SV:         38                               biplane is LV SV Index:   19                               26.8 %. LVOT Area:     2.27 cm Diastology LV e' medial:    4.24 cm/s LV Volumes (MOD)               LV E/e' medial:   25.7 LV vol d, MOD    195.5 ml      LV e' lateral:   5.22 cm/s A2C:                           LV E/e' lateral: 20.9 LV vol d, MOD    153.1 ml A4C: LV vol s, MOD    145.0 ml A2C: LV vol s, MOD    128.6 ml A4C: LV SV MOD A2C:   50.5 ml LV SV MOD A4C:   153.1 ml LV SV MOD BP:    47.8 ml  RIGHT VENTRICLE            IVC RV Basal diam:  2.90 cm    IVC diam: 2.60 cm RV S prime:     9.57 cm/s TAPSE (M-mode): 0.8 cm  LEFT ATRIUM             Index        RIGHT ATRIUM           Index LA diam:        4.00 cm 2.04 cm/m   RA Area:     15.80 cm LA Vol (A2C):   70.5 ml 36.04 ml/m  RA Volume:   42.70 ml  21.83 ml/m LA Vol (A4C):   59.3 ml 30.31 ml/m LA Biplane Vol: 70.2 ml 35.89 ml/m AORTIC VALVE AV Area (Vmax):    1.02 cm AV Area (Vmean):   1.02 cm AV Area (VTI):     1.03 cm AV Vmax:           205.50 cm/s AV Vmean:          134.000 cm/s AV VTI:  0.364 m AV Peak Grad:      16.9 mmHg AV Mean Grad:      8.5 mmHg LVOT Vmax:         92.20 cm/s LVOT Vmean:        60.450 cm/s LVOT VTI:          0.166 m LVOT/AV VTI ratio: 0.45  AORTA Ao Root diam: 3.30 cm Ao Asc diam:  3.10 cm  MITRAL VALVE                TRICUSPID VALVE MV Area (PHT): 5.66 cm     TR Peak grad:   42.0 mmHg MV Area VTI:   1.42 cm     TR Vmax:        324.00 cm/s MV Peak grad:  6.4 mmHg MV Mean grad:  3.0 mmHg     SHUNTS MV Vmax:       1.26 m/s     Systemic VTI:  0.17 m MV Vmean:      87.4 cm/s    Systemic Diam: 1.70 cm MV Decel Time: 134 msec MV E velocity: 109.00 cm/s MV A velocity: 85.90 cm/s MV E/A ratio:  1.27  Lyman Bishop MD Electronically signed by Lyman Bishop MD Signature Date/Time: 05/28/2022/6:48:53 PM    Final     CT SCANS  CT CORONARY MORPH W/CTA COR W/SCORE 11/16/2021  Addendum 11/16/2021  1:31 PM ADDENDUM REPORT: 11/16/2021 13:29  CLINICAL DATA:  Aortic Valve pathology with assessment for TAVR  EXAM: Cardiac TAVR CT  TECHNIQUE: The patient was scanned on a Siemens  [id number] slice scanner. A 120 kV retrospective scan was triggered in the descending thoracic aorta at 111 HU's. Gantry rotation speed was 270 msecs and collimation was .9 mm. No beta blockade or nitro were given. The 3D data set was reconstructed in 5% intervals of the R-R cycle. Systolic and diastolic phases were analyzed on a dedicated work station using MPR, MIP and VRT modes. The patient received 100 cc of contrast.  FINDINGS: Aortic Valve: Severely thickened aortic valve with heavy calcification and reduced excursion the planimeter valve area is 0.99 Sq cm consistent with severe aortic stenosis  Number of leaflets: Three- calcification of the left valve leaflet without true raphe/forme fruste pathology  LVOT calcification: Inferior LVOT calcification below the annulus in the intervalvular fibrosa  Annular calcification: Severe one calcification that protrudes into the lumen and presence of LVOT calcification  Aortic Valve Calcium Score: 2478  Presence of basal septal hypertrophy: No  Perimembranous septal diameter: 7 mm  Mitral Valve: Moderate mitral annular calcification  Aortic Annulus Measurements- 20% Phase  Major annulus diameter: 32 mm  Minor annulus diameter: 23 mm  Annular perimeter: 88 mm  Annular area: 5.74 cm2  Aortic Root Measurements  Sinotubular Junction: 29 mm; there is annular calcification at the level of the Sinotubular junction superior to the left sinus.  Ascending Thoracic Aorta: 34 mm  Aortic Arch: 26 mm  Descending Thoracic Aorta: 26 mm  Sinus of Valsalva Measurements:  Right coronary cusp width: 32 mm  Left coronary cusp width: 34 mm  Non coronary cusp width: 34 mm  Coronary Artery Height above Annulus:  Left Main: 14 mm  Left SoV height: 22 mm  Right Coronary: 16 mm  Right SoV height: 21 mm  Optimum Fluoroscopic Angle for Delivery: LAO 2, CRA 5  Valves for structural team consideration: 29 mm Sapien  Valve recommended  Suitable sinus heights and diameters for a 31  mm CoreValve, but with slight sinus asymmetry  Non TAVR Valve Findings:  Coronary Arteries: Normal coronary origin. Study not completed with nitroglycerin.  Coronary Calcium Score:  Left main: 0  Left anterior descending artery: 1100  Left circumflex artery: 94  Right coronary artery: 842  Ramus intermedius artery: 11  Total: 2037  Percentile: 98th for age, sex, and race matched control.  Small PFO  Pulmonary veins: Normal anatomy.  Main Pulmonary artery: Mild dilation 30 mm  Left atrial appendage: Delayed contrast uptake into the left atrial appendage tip, reviewed with CAC sequence thrombus appears less probably.  Extra Cardiac Findings as per separate reporting.  IMPRESSION: 1. Severe aortic stenosis. Findings pertinent to TAVR procedure are detailed above.  2. Patient's total coronary artery calcium score is 2037, which is 98th percentile for subjects of the same age, gender, and race based populations.  RECOMMENDATIONS:  The proposed cut-off value of 1,651 AU yielded a 93 % sensitivity and 75 % specificity in grading AS severity in patients with classical low-flow, low-gradient AS. Proposed different cut-off values to define severe AS for men and women as 2,065 AU and 1,274 AU, respectively. The joint European and American recommendations for the assessment of AS consider the aortic valve calcium score as a continuum - a very high calcium score suggests severe AS and a low calcium score suggests severe AS is unlikely.  Kerman Passey, et al. 2017 ESC/EACTS Guidelines for the management of valvular heart disease. Eur Heart J [phone]  Coronary artery calcium (CAC) score is a strong predictor of incident coronary heart disease (CHD) and provides predictive information beyond traditional risk factors. CAC scoring is reasonable to use in the decision to withhold,  postpone, or initiate statin therapy in intermediate-risk or selected borderline-risk asymptomatic adults (age 88-75 years and LDL-C >=70 to <190 mg/dL) who do not have diabetes or established atherosclerotic cardiovascular disease (ASCVD).* In intermediate-risk (10-year ASCVD risk >=7.5% to <20%) adults or selected borderline-risk (10-year ASCVD risk >=5% to <7.5%) adults in whom a CAC score is measured for the purpose of making a treatment decision the following recommendations have been made:  If CAC = 0, it is reasonable to withhold statin therapy and reassess in 5 to 10 years, as long as higher risk conditions are absent (diabetes mellitus, family history of premature CHD in first degree relatives (males <55 years; females <65 years), cigarette smoking, LDL >=190 mg/dL or other independent risk factors).  If CAC is 1 to 99, it is reasonable to initiate statin therapy for patients >=32 years of age.  If CAC is >=100 or >=75th percentile, it is reasonable to initiate statin therapy at any age.  Cardiology referral should be considered for patients with CAC scores >=400 or >=75th percentile.  *2018 AHA/ACC/AACVPR/AAPA/ABC/ACPM/ADA/AGS/APhA/ASPC/NLA/PCNA Guideline on the Management of Blood Cholesterol: A Report of the American College of Cardiology/American Heart Association Task Force on Clinical Practice Guidelines. J Am Coll Cardiol. 2019;73(24):3168-3209.  Mahesh  Chandrasekhar   Electronically Signed By: Rudean Haskell M.D. On: 11/16/2021 13:29  Narrative EXAM: OVER-READ INTERPRETATION  CT CHEST  The following report is a limited chest CT over-read performed by radiologist Dr. Salvatore Marvel of [facility], [place] on 11/16/2021. This over-read does not include interpretation of cardiac or coronary anatomy or pathology. The cardiac CTA interpretation by the cardiologist is attached.  COMPARISON:  05/27/2020 chest CT angiogram.  FINDINGS: Please see the  separate concurrent chest CT angiogram report for details.  IMPRESSION: Please see  the separate concurrent chest CT angiogram report for details.  Electronically Signed: By: Ilona Sorrel M.D. On: 11/16/2021 11:52           Patient Profile     72 y.o. male with a hx of  HTN, DM2, CKD 3A, PAD with prior bilateral popliteal stenting, carotid stenosis s/p bilateral TCAR, chronic HFrEF LVEF 20%/NICM, severe AS s/p TAVR 01/11/22 who is being seen 05/28/2022 for the evaluation of SOB at the request of Dr Tana Coast.    Assessment & Plan    Acute on chronic HFrEF - 12/2021 echo: LVEF 20-25% - 01/2022 echo: LVEF 35-40%, global hypokinesis, grade I dd, normal RV, AVR no significant stenosis mild PVL - 10/2021 cath: mild to mod nonobstructive CAD. Mean PA 38, PCWP 19 CI inaccurate  - CXR +pulm edema, BNP >4500.  182 lbs during 02/2022 clinic visit - echo this admit LVEF 25-30%, grade II dd, mild RV dysfunction, normal AVR - labile EF since October  - Presented with AKI making diuresis more difficult.  - received IV lasix '60mg'$  then '80mg'$  bid.  - remains fluid overloaded. Neprhology ok with diuresis.  Creatinine down to 2.1  - medical therapy with coreg 12.'5mg'$  bid, hydral '25mg'$  tid, imdur 30. Entresto and jardaince on hold given AKI. From HF clinic note prior hyperkalemia, considering trial low dose MRA at f/u but has not been on recently.      AKI on CKD3a - Cr 2 months ago 1.4, 2 weeks ago 1.8. On admit 2.6 with peak to 2.7 this admit. Down trend today to 2.1 -taking NSAIDs at home.  Explained the importance of avoidance.  Also on entresto at home.  Hopefully he will be able to resume. - renal US negative.  - ongoing management per renal  - fortunately Cr trending down with diuresis. Nephrology agrees with IV diuretics   History of TAVR 12/2021 - normal TAVR by echo this admission     For questions or updates, please contact [place] Please consult [email or web address] for contact info  under        Signed, Candee Furbish, MD  05/30/2022, 11:09 AM

## 2022-05-30 NOTE — Progress Notes (Signed)
[facility] Progress Note  Subjective:  He had 4.8 liters UOP over 3/10 charted as well as one unmeasured urine void.  He has been on lasix 80 mg IV BID.  Noted the he is hoping to visit his 72 month old great grandson in [place] on Thursday.  No weight from this AM.  He walked in the hall today.  He was on lasix 40 mg daily at home then for a time increased to 40 mg BID.  He was changed to 80 mg lasix at once and feels like he urinated more with that dose.  He follows with nephrology at the [facility].   Review of systems:  Reports he had shortness of breath with exertion which has gotten much better Earlier had n/v and this has been gone the past couple of days  No chest pain  Vitals:   05/29/22 2039 05/30/22 0539 05/30/22 0730 05/30/22 1233  BP: 104/75 116/82  113/77  Pulse: 81 78  92  Resp: '19 19 17 20  '$ Temp: 98.7 F (37.1 C) 98.7 F (37.1 C)  (!) 97.5 F (36.4 C)  TempSrc:  Oral  Oral  SpO2: 98% 98%  98%  Weight:      Height:        Physical Exam:  General adult male in bed in no acute distress HEENT normocephalic atraumatic extraocular movements intact sclera anicteric Neck supple trachea midline Lungs bibasilar crackles normal work of breathing at rest  Heart S1S2 no rub Abdomen soft nontender nondistended Extremities no edema  Psych normal mood and affect Neuro alert and oriented x 3 provides hx and follows commands   UA 0-5 rbc/6-11 wbc, prot 30 UCr 92, UP/C 0.62  Renal US - 10.1/ 10.9 cm kidneys, no hydro  3/08 CXR - vasc congestion w/ mild pulm edema  3/10  CXR - this am, edema/ congestion resolved  Assessment/ Plan: AKI on CKD 3b: b/l creatinine 1.8, eGFR 39 ml/min, from Feb 2024. Creat here 2.5 on admission in the setting of multiple renal insults including NSAIDS and decompensated CHF w/ pulm edema in the setting of entresto and synjardy. Renal US,  UA and UP/C were unremarkable. 24 hr urine collection in October negative for M-spike, will not repeat  now.  Improving with supportive care Goal to resume entresto Would recommend transition to jardiance as below (rather than jardiance-metformin combination).  CKD stage 3b - note baseline Cr 1.8.  he will need follow-up with his nephrologist at the [facility] on chronic systolic CHF exacerbation - on BB, hydral/ imdur. lasix. LVEF 20%/ NICM. Cardiology is following.  Clinically much improved per pt Goal to transition to lasix 80 mg PO daily in the next 1-2 days SP TAVR DM II - holding jardiance-metformin combination agent and as an outpatient would likely benefit from jardiance as a standalone medication rather than combination agent as anticipate may have future AKI events.  Per primary HTN - note hydralazine discontinued due to hypotension  Disposition - goal to transition to PO diuretics tomorrow if cardiology agrees.  Note he is hopeful for a family trip to [place] on 3/14, Thursday   Recent Labs  Lab 05/29/22 0441 05/30/22 0340  HGB 14.1 14.7  ALBUMIN 3.1* 3.2*  CALCIUM 8.3* 8.6*  CREATININE 2.52* 2.11*  K 4.5 3.9   No results for input(s): "IRON", "TIBC", "FERRITIN" in the last 168 hours. Inpatient medications:  aspirin EC  81 mg Oral Daily   atorvastatin  40 mg Oral Daily  carvedilol  12.5 mg Oral BID WC   clopidogrel  75 mg Oral Daily   enoxaparin (LOVENOX) injection  30 mg Subcutaneous Q24H   furosemide  80 mg Intravenous BID   insulin aspart  0-15 Units Subcutaneous TID WC   insulin aspart  0-5 Units Subcutaneous QHS   insulin aspart  3 Units Subcutaneous TID WC   insulin glargine-yfgn  10 Units Subcutaneous Daily   isosorbide mononitrate  30 mg Oral Daily   pantoprazole  40 mg Oral Q0600   polyethylene glycol  17 g Oral Daily   sodium chloride flush  3 mL Intravenous Q12H   cyanocobalamin  500 mcg Oral Daily    sodium chloride     sodium chloride, acetaminophen, fentaNYL (SUBLIMAZE) injection, hydrALAZINE, ondansetron (ZOFRAN) IV, mouth rinse, sodium chloride  flush   Claudia Desanctis, MD 3:58 PM 05/30/2022

## 2022-05-31 DIAGNOSIS — I739 Peripheral vascular disease, unspecified: Secondary | ICD-10-CM | POA: Diagnosis not present

## 2022-05-31 DIAGNOSIS — N179 Acute kidney failure, unspecified: Secondary | ICD-10-CM | POA: Diagnosis not present

## 2022-05-31 DIAGNOSIS — E1169 Type 2 diabetes mellitus with other specified complication: Secondary | ICD-10-CM | POA: Diagnosis not present

## 2022-05-31 DIAGNOSIS — N189 Chronic kidney disease, unspecified: Secondary | ICD-10-CM | POA: Diagnosis not present

## 2022-05-31 DIAGNOSIS — I160 Hypertensive urgency: Secondary | ICD-10-CM | POA: Diagnosis not present

## 2022-05-31 DIAGNOSIS — I5023 Acute on chronic systolic (congestive) heart failure: Secondary | ICD-10-CM | POA: Diagnosis not present

## 2022-05-31 DIAGNOSIS — I509 Heart failure, unspecified: Secondary | ICD-10-CM | POA: Diagnosis not present

## 2022-05-31 LAB — GLUCOSE, CAPILLARY
Glucose-Capillary: 211 mg/dL — ABNORMAL HIGH (ref 70–99)
Glucose-Capillary: 137 mg/dL — ABNORMAL HIGH (ref 70–99)
Glucose-Capillary: 128 mg/dL — ABNORMAL HIGH (ref 70–99)
Glucose-Capillary: 202 mg/dL — ABNORMAL HIGH (ref 70–99)

## 2022-05-31 LAB — COMPREHENSIVE METABOLIC PANEL
ALT: 34 U/L (ref 0–44)
AST: 28 U/L (ref 15–41)
Albumin: 3.3 g/dL — ABNORMAL LOW (ref 3.5–5.0)
Alkaline Phosphatase: 124 U/L (ref 38–126)
Anion gap: 13 (ref 5–15)
BUN: 64 mg/dL — ABNORMAL HIGH (ref 8–23)
CO2: 29 mmol/L (ref 22–32)
Calcium: 8.8 mg/dL — ABNORMAL LOW (ref 8.9–10.3)
Chloride: 91 mmol/L — ABNORMAL LOW (ref 98–111)
Creatinine, Ser: 2.11 mg/dL — ABNORMAL HIGH (ref 0.61–1.24)
GFR, Estimated: 33 mL/min — ABNORMAL LOW (ref >60)
Glucose, Bld: 196 mg/dL — ABNORMAL HIGH (ref 70–99)
Potassium: 3.7 mmol/L (ref 3.5–5.1)
Sodium: 133 mmol/L — ABNORMAL LOW (ref 135–145)
Total Bilirubin: 0.4 mg/dL (ref 0.3–1.2)
Total Protein: 7.5 g/dL (ref 6.5–8.1)

## 2022-05-31 NOTE — Progress Notes (Signed)
[facility] Progress Note  Subjective:  He had 3.3 liters UOP over 3/11 charted.  Hoping to visit young great grandson in [place] on Thursday.  He follows with nephrology at the [facility] and has an appointment coming up with them within the next two weeks. Weight down to 76 kg (168 lbs) this AM.    Review of systems:   Denies shortness of breath with exertion  He has been walking around the halls Denies n/v No chest pain  Vitals:   05/31/22 0500 05/31/22 0825 05/31/22 1122 05/31/22 1207  BP:  121/89  99/61  Pulse:  90  89  Resp:  '19 20 18  '$ Temp:    97.6 F (36.4 C)  TempSrc:    Oral  SpO2:    94%  Weight: 76 kg     Height:        Physical Exam:   General adult male in bed in no acute distress HEENT normocephalic atraumatic extraocular movements intact sclera anicteric Neck supple trachea midline Lungs bibasilar crackles normal work of breathing at rest on room air Heart S1S2 no rub Abdomen soft nontender nondistended Extremities no edema  Psych normal mood and affect Neuro alert and oriented x 3 provides hx and follows commands   UA 0-5 rbc/6-11 wbc, prot 30 UCr 92, UP/C 0.62  Renal US - 10.1/ 10.9 cm kidneys, no hydro  3/08 CXR - vasc congestion w/ mild pulm edema  3/10  CXR - this am, edema/ congestion resolved  Assessment/ Plan: AKI on CKD 3b: b/l creatinine 1.8, eGFR 39 ml/min, from Feb 2024. Creat here 2.5 on admission in the setting of multiple renal insults including NSAIDS and decompensated CHF w/ pulm edema in the setting of entresto and synjardy. Renal US,  UA and UP/C were unremarkable. 24 hr urine collection in October negative for M-spike, will not repeat now.  Improving with supportive care Goal to resume entresto outpatient after aggressive diuresis  Would recommend transition to jardiance as below (rather than jardiance-metformin combination).  CKD stage 3b - note baseline Cr 1.8.  he will need follow-up with his nephrologist at the [facility] on  chronic systolic CHF exacerbation - on BB, hydral/ imdur. lasix. LVEF 20%/ NICM. Cardiology is following.  Clinically much improved per pt Goal to transition to lasix 80 mg PO daily tomorrow  S/P TAVR DM II - holding jardiance-metformin combination agent and as an outpatient would likely benefit from jardiance as a standalone medication rather than combination agent as anticipate may have future AKI events.  Per primary HTN - note hydralazine discontinued due to hypotension  Disposition - goal to transition to PO diuretics tomorrow (3/13) if cardiology agrees.  Stable for discharge after that standpoint from a strictly renal perspective.  Note he is hopeful for a family trip to [place] on 3/14, Thursday and has nephrology follow-up with his [place] nephrologist within two weeks  Recent Labs  Lab 05/29/22 0441 05/30/22 0340 05/31/22 0416  HGB 14.1 14.7  --   ALBUMIN 3.1* 3.2* 3.3*  CALCIUM 8.3* 8.6* 8.8*  CREATININE 2.52* 2.11* 2.11*  K 4.5 3.9 3.7   No results for input(s): "IRON", "TIBC", "FERRITIN" in the last 168 hours. Inpatient medications:  aspirin EC  81 mg Oral Daily   atorvastatin  40 mg Oral Daily   carvedilol  12.5 mg Oral BID WC   clopidogrel  75 mg Oral Daily   enoxaparin (LOVENOX) injection  30 mg Subcutaneous Q24H   furosemide  80  mg Intravenous BID   insulin aspart  0-15 Units Subcutaneous TID WC   insulin aspart  0-5 Units Subcutaneous QHS   insulin aspart  3 Units Subcutaneous TID WC   insulin glargine-yfgn  10 Units Subcutaneous Daily   isosorbide mononitrate  30 mg Oral Daily   pantoprazole  40 mg Oral Q0600   polyethylene glycol  17 g Oral Daily   sodium chloride flush  3 mL Intravenous Q12H   cyanocobalamin  500 mcg Oral Daily    sodium chloride     sodium chloride, acetaminophen, fentaNYL (SUBLIMAZE) injection, hydrALAZINE, ondansetron (ZOFRAN) IV, mouth rinse, sodium chloride flush   Claudia Desanctis, MD 3:44 PM 05/31/2022

## 2022-05-31 NOTE — Progress Notes (Signed)
[facility]                                                                              Caleb [name], is a 72 y.o. male, DOB - [birth year], [id number] Admit date - 05/27/2022    Outpatient Primary MD for the patient is Caleb Rakes, MD  LOS - 4  days  Chief Complaint  Patient presents with   Shortness of Breath       Brief summary   Caleb [name] is a 72 y.o. male with PMH significant of chronic systolic CHF, EF 35 to [id number], G1 DD per 2D echo on 02/16/2022, diabetes mellitus type 2, GERD, HTN, HLP, diabetic neuropathy, aortic stenosis, TAVR in 12/2021, CKD stage IIIb presented to ED with shortness of breath, nausea and vomiting.  Patient reported that shortness of breath has been going on for at least a week, no chest pain, no significant peripheral edema.  This morning he woke up and started having nausea and vomiting with 4 episodes of emesis.  No hematemesis.  No abdominal pain or diarrhea.  He has right heel ulcer and has been seeing podiatry.  Patient reported that he had started tramadol and also has been taking a large amount of ibuprofen for the pain in the last week. Of note, patient was seen in cardiology office on 05/19/2022 and had recently increased his Lasix to 80 mg daily.   ED course:  In ED, temp 97.6, respiratory rate 17, pulse 91, BP 161/113, O2 sats 100% on room air Sodium 133, potassium 5.2, CO2 22, BUN 53, creatinine 2.62.  Creatinine was 1.8 on 05/12/2022.  Baseline creatinine 1.2-1.3   Assessment & Plan   Principal Problem:   Acute kidney injury superimposed on chronic kidney disease 3B ([place]) -Baseline creatinine 1.2-1.3, most recently 1.8 on 05/12/2022.  On admission 2.62, BUN 53.   - Cr worsened acutely due to  NSAID's, nausea vomiting, intravascular depletion with dehydration, recently increased Lasix to 80 mg daily, and Entresto -Currently on IV Lasix 80 mg twice daily, net negative balance of 13 L, excellent diuresis, symptomatically  improving  -Creatinine remains stable 2.1, nephrology and cardiology following -Patient hoping for discharge tomorrow so he can visit his family in [place] on Thursday, 3/14   Active Problems:   Acute on chronic systolic and diastolic CHF (congestive heart failure) ([place]), history of CAD -2D echo 01/2022 had shown EF of 35 to 40%, G1 DD, stable TAVR -Chest x-ray with pulmonary edema, elevated BNP > 4500, JVD -Appreciate cardiology and nephrology recommendations.  Started on IV Lasix with symptomatic improvement -2D echo showed EF 25 to 30%, global hypokinesis, G2 DD  -Excellent diuresis, new IV Lasix 80 mg twice daily    Hypertensive urgency -BP stable, continue Coreg, Lasix, Imdur -Entresto on hold   Nausea vomiting, dehydration -Now improving, likely due to tramadol,  -currently no nausea or vomiting, tolerating diet   Hyperkalemia Resolved     Peripheral vascular disease ([place]), carotid artery stenosis s/p bilateral TCAR -Continue Plavix, statin -Per patient, he is being worked up for upcoming vascular surgeries for PAD. - ABI's completed with moderate PAD on  the right LE arterial disease, mild on the left -Patient has an appointment with Dr. Gwenlyn Found on 06/08/2022 for further management     Type 2 diabetes mellitus with hyperlipidemia ([place]) -Hemoglobin A1c 7.9 on 04/25/2022. Hold empagliflozin- metformin CBG (last 3)  Recent Labs    05/30/22 2145 05/31/22 0751 05/31/22 1204  GLUCAP 309* 211* 137*  CBGs improving, continue Semglee 10 units daily, moderate SSI, NovoLog meal coverage 3 units 3 times daily AC      Hyperlipidemia -Continue Lipitor     Diabetic neuropathy ([facility]) -Hold Neurontin due to AKI     Decubitus ulcer of right heel -Appears to have dry ulcer with eschar on the right heel, no gangrene or drainage -Per patient he was taking excessive ibuprofen for the right heel ulcer and was been placed on tramadol by podiatry, Dr. Posey Pronto.   -Tramadol caused nausea and  vomiting hence discontinued -Appreciate podiatry recommendations, wound care on the right heel.  Patient has appointment with his podiatrist, Dr. Posey Pronto on 06/07/2022. -ABIs completed, outpatient vascular workup with Dr. Gwenlyn Found, appointment on 06/08/2022    Severe AS status post TAVR in 10/23 -2D echo showed stable TAVR  Estimated body mass index is 26.24 kg/m as calculated from the following:   Height as of this encounter: '5\' 7"'$  (1.702 m).   Weight as of this encounter: 76 kg.  Code Status: Full code DVT Prophylaxis:  enoxaparin (LOVENOX) injection 30 mg Start: 05/27/22 2200   Level of Care: Level of care: Telemetry Family Communication: Updated patient Disposition Plan:      Remains inpatient appropriate: Hopefully DC home tomorrow   Procedures:  2D echo  Consultants:   Cardiology Nephrology Podiatry  Antimicrobials: None    Medications  aspirin EC  81 mg Oral Daily   atorvastatin  40 mg Oral Daily   carvedilol  12.5 mg Oral BID WC   clopidogrel  75 mg Oral Daily   enoxaparin (LOVENOX) injection  30 mg Subcutaneous Q24H   furosemide  80 mg Intravenous BID   insulin aspart  0-15 Units Subcutaneous TID WC   insulin aspart  0-5 Units Subcutaneous QHS   insulin aspart  3 Units Subcutaneous TID WC   insulin glargine-yfgn  10 Units Subcutaneous Daily   isosorbide mononitrate  30 mg Oral Daily   pantoprazole  40 mg Oral Q0600   polyethylene glycol  17 g Oral Daily   sodium chloride flush  3 mL Intravenous Q12H   cyanocobalamin  500 mcg Oral Daily      Subjective:   Caleb [name] was seen and examined today.  Shortness of has improved significantly.,  Hoping to get discharged tomorrow so he can visit his family on Thursday.  On IV Lasix. No chest pain, nausea or vomiting.  Objective:   Vitals:   05/31/22 0500 05/31/22 0825 05/31/22 1122 05/31/22 1207  BP:  121/89  99/61  Pulse:  90  89  Resp:  '19 20 18  '$ Temp:    97.6 F (36.4 C)  TempSrc:    Oral  SpO2:    94%   Weight: 76 kg     Height:        Intake/Output Summary (Last 24 hours) at 05/31/2022 1250 Last data filed at 05/31/2022 0900 Gross per 24 hour  Intake 240 ml  Output 2775 ml  Net -2535 ml     Wt Readings from Last 3 Encounters:  05/31/22 76 kg  05/19/22 81.9 kg  05/13/22 81.2 kg  Physical Exam General: Alert and oriented x 3, NAD Cardiovascular: S1 S2 clear, RRR.  Respiratory: Bibasilar crackles Gastrointestinal: Soft, nontender, nondistended, NBS Ext: no pedal edema bilaterally Neuro: no new deficits Psych: Normal affect     Data Reviewed:  I have personally reviewed following labs    CBC Lab Results  Component Value Date   WBC 6.6 05/30/2022   RBC 5.46 05/30/2022   HGB 14.7 05/30/2022   HCT 45.3 05/30/2022   MCV 83.0 05/30/2022   MCH 26.9 05/30/2022   PLT 324 05/30/2022   MCHC 32.5 05/30/2022   RDW 15.0 05/30/2022   LYMPHSABS 1.1 05/27/2022   MONOABS 0.5 05/27/2022   EOSABS 0.0 05/27/2022   BASOSABS 0.0 [id number]     Last metabolic panel Lab Results  Component Value Date   NA 133 (L) 05/31/2022   K 3.7 05/31/2022   CL 91 (L) 05/31/2022   CO2 29 05/31/2022   BUN 64 (H) 05/31/2022   CREATININE 2.11 (H) 05/31/2022   GLUCOSE 196 (H) 05/31/2022   GFRNONAA 33 (L) 05/31/2022   GFRAA >60 12/17/2019   CALCIUM 8.8 (L) 05/31/2022   PROT 7.5 05/31/2022   ALBUMIN 3.3 (L) 05/31/2022   LABGLOB 3.2 03/09/2022   AGRATIO 1.0 (L) 01/19/2021   BILITOT 0.4 05/31/2022   ALKPHOS 124 05/31/2022   AST 28 05/31/2022   ALT 34 05/31/2022   ANIONGAP 13 05/31/2022    CBG (last 3)  Recent Labs    05/30/22 2145 05/31/22 0751 05/31/22 1204  GLUCAP 309* 211* 137*      Coagulation Profile: No results for input(s): "INR", "PROTIME" in the last 168 hours.   Radiology Studies: I have personally reviewed the imaging studies  No results found.     Estill Cotta M.D. [facility] 05/31/2022, 12:50 PM  Available via Epic secure chat 7am-7pm After 7 pm,  please refer to night coverage provider listed on amion.

## 2022-05-31 NOTE — Progress Notes (Signed)
Rounding Note    Patient Name: Caleb [name] Date of Encounter: 05/31/2022  [place] Cardiologist: Quay Burow, MD   Subjective   Feeling markedly better. Hoping to go home tomorrow.   Net negative 2.7L  Inpatient Medications    Scheduled Meds:  aspirin EC  81 mg Oral Daily   atorvastatin  40 mg Oral Daily   carvedilol  12.5 mg Oral BID WC   clopidogrel  75 mg Oral Daily   enoxaparin (LOVENOX) injection  30 mg Subcutaneous Q24H   furosemide  80 mg Intravenous BID   insulin aspart  0-15 Units Subcutaneous TID WC   insulin aspart  0-5 Units Subcutaneous QHS   insulin aspart  3 Units Subcutaneous TID WC   insulin glargine-yfgn  10 Units Subcutaneous Daily   isosorbide mononitrate  30 mg Oral Daily   pantoprazole  40 mg Oral Q0600   polyethylene glycol  17 g Oral Daily   sodium chloride flush  3 mL Intravenous Q12H   cyanocobalamin  500 mcg Oral Daily   Continuous Infusions:  sodium chloride     PRN Meds: sodium chloride, acetaminophen, fentaNYL (SUBLIMAZE) injection, hydrALAZINE, ondansetron (ZOFRAN) IV, mouth rinse, sodium chloride flush   Vital Signs    Vitals:   05/30/22 1233 05/30/22 2141 05/31/22 0500 05/31/22 0825  BP: 113/77 (!) 120/93  121/89  Pulse: 92 86  90  Resp: '20 17  19  '$ Temp: (!) 97.5 F (36.4 C) 98.1 F (36.7 C)    TempSrc: Oral Oral    SpO2: 98% 98%    Weight:   76 kg   Height:        Intake/Output Summary (Last 24 hours) at 05/31/2022 1054 Last data filed at 05/31/2022 0900 Gross per 24 hour  Intake 480 ml  Output 3375 ml  Net -2895 ml      05/31/2022    5:00 AM 05/29/2022    9:04 AM 05/29/2022    5:00 AM  Last 3 Weights  Weight (lbs) 167 lb 8.8 oz 180 lb 1.9 oz 177 lb 11.1 oz  Weight (kg) 76 kg 81.7 kg 80.6 kg      Telemetry    NSR, occasional PVCs, one brief run of trigeminy - Personally Reviewed  ECG    No new tracing - Personally Reviewed  Physical Exam   GEN: No acute distress.   Neck: No  JVD Cardiac: RRR, 2/6 systolic murmur Respiratory: Bibasilar crackles GI: Soft, nontender, non-distended  MS: No edema; No deformity. Neuro:  Nonfocal  Psych: Normal affect   Labs    High Sensitivity Troponin:   Recent Labs  Lab 05/27/22 1513 05/27/22 1900  TROPONINIHS 59* 65*     Chemistry Recent Labs  Lab 05/29/22 0441 05/30/22 0340 05/31/22 0416  NA 134* 133* 133*  K 4.5 3.9 3.7  CL 95* 94* 91*  CO2 '30 30 29  '$ GLUCOSE 154* 160* 196*  BUN 60* 57* 64*  CREATININE 2.52* 2.11* 2.11*  CALCIUM 8.3* 8.6* 8.8*  PROT 7.2 7.4 7.5  ALBUMIN 3.1* 3.2* 3.3*  AST '26 27 28  '$ ALT 36 34 34  ALKPHOS 129* 128* 124  BILITOT 0.5 0.6 0.4  GFRNONAA 26* 33* 33*  ANIONGAP '9 9 13    '$ Lipids No results for input(s): "CHOL", "TRIG", "HDL", "LABVLDL", "LDLCALC", "CHOLHDL" in the last 168 hours.  Hematology Recent Labs  Lab 05/28/22 0429 05/29/22 0441 05/30/22 0340  WBC 7.5 7.6 6.6  RBC 4.56 5.13 5.46  HGB 11.9* 14.1 14.7  HCT 37.6* 42.7 45.3  MCV 82.5 83.2 83.0  MCH 26.1 27.5 26.9  MCHC 31.6 33.0 32.5  RDW 15.3 15.2 15.0  PLT 283 304 324   Thyroid No results for input(s): "TSH", "FREET4" in the last 168 hours.  BNP Recent Labs  Lab 05/27/22 1513  BNP >4,500.0*    DDimer No results for input(s): "DDIMER" in the last 168 hours.   Radiology    VAS [place] ABI WITH/WO TBI  Result Date: 05/29/2022  LOWER EXTREMITY DOPPLER STUDY Patient Name:  Caleb [name]  Date of Exam:   05/29/2022 Medical Rec #: [id number]        Accession #:    [id number] Date of Birth: 1950-12-25         Patient Gender: M Patient Age:   16 years Exam Location:  [facility] Procedure:      VAS [place] ABI WITH/WO TBI Referring Phys: RIPUDEEP RAI --------------------------------------------------------------------------------  Indications: Peripheral artery disease. High Risk Factors: Hyperlipidemia, Diabetes.  Comparison Study: 06/03/2020 - Right: Resting right ankle-brachial index is                   within  normal range. No                   evidence of significant right lower extremity arterial                   disease. The right                   toe-brachial index is normal.                    Left: Resting left ankle-brachial index indicates moderate                   left lower                   extremity arterial disease. The left toe-brachial index is                   abnormal.                    05/19/2021 - Right: Resting right ankle-brachial index indicates                   moderate right lower                   extremity arterial disease. The right toe-brachial index is                   abnormal.                    Left: Resting left ankle-brachial index indicates moderate                   left lower                   extremity arterial disease. The left toe-brachial index is                   abnormal. Performing Technologist: Carlos Levering RVT  Examination Guidelines: A complete evaluation includes at minimum, Doppler waveform signals and systolic blood pressure reading at the level of bilateral brachial, anterior tibial, and posterior tibial arteries, when vessel segments are accessible. Bilateral testing is considered an integral part of  a complete examination. Photoelectric Plethysmograph (PPG) waveforms and toe systolic pressure readings are included as required and additional duplex testing as needed. Limited examinations for reoccurring indications may be performed as noted.  ABI Findings: +--------+------------------+-----+----------+--------+ Right   Rt Pressure (mmHg)IndexWaveform  Comment  +--------+------------------+-----+----------+--------+ [id number]                    triphasic          +--------+------------------+-----+----------+--------+ PTA     88                0.71 monophasic         +--------+------------------+-----+----------+--------+ DP      74                0.60 monophasic         +--------+------------------+-----+----------+--------+  +---------+------------------+-----+----------+-------+ Left     Lt Pressure (mmHg)IndexWaveform  Comment +---------+------------------+-----+----------+-------+ Brachial 116                    triphasic         +---------+------------------+-----+----------+-------+ PTA      116               0.94 monophasic        +---------+------------------+-----+----------+-------+ DP       91                0.73 monophasic        +---------+------------------+-----+----------+-------+ Great Toe54                0.44                   +---------+------------------+-----+----------+-------+ +-------+-----------+-----------+------------+------------+ ABI/TBIToday's ABIToday's TBIPrevious ABIPrevious TBI +-------+-----------+-----------+------------+------------+ Right  0.71                  0.63        0.62         +-------+-----------+-----------+------------+------------+ Left   0.94       0.44       0.76        0.66         +-------+-----------+-----------+------------+------------+  Summary: Right: Resting right ankle-brachial index indicates moderate right lower extremity arterial disease. Unable to obtain TBI due to low amplitude/absent waveforms. Left: Resting left ankle-brachial index indicates mild left lower extremity arterial disease. The left toe-brachial index is abnormal. ABI's are likely falsely elevated due to medial calcification. *See table(s) above for measurements and observations.  Electronically signed by Jamelle Haring on 05/29/2022 at 4:33:22 PM.    Final     Cardiac Studies   TTE 05/2022: IMPRESSIONS     1. Left ventricular ejection fraction, by estimation, is 25 to 30%. Left  ventricular ejection fraction by 2D MOD biplane is 26.8 %. The left  ventricle has severely decreased function. The left ventricle demonstrates  global hypokinesis. There is mild  left ventricular hypertrophy. Left ventricular diastolic parameters are  consistent with Grade  II diastolic dysfunction (pseudonormalization).  Elevated left ventricular end-diastolic pressure. The E/e' is 20. There is  severe akinesis of the left  ventricular, mid-apical anteroseptal wall, septal wall, anterior wall,  inferoapical segment and anterolateral wall.   2. Right ventricular systolic function is mildly reduced. The right  ventricular size is normal. There is moderately elevated pulmonary artery  systolic pressure. The estimated right ventricular systolic pressure is  0000000 mmHg.   3. Left atrial size was mildly dilated.   4. The mitral valve is abnormal. Mild  mitral valve regurgitation.   5. The aortic valve has been repaired/replaced. Aortic valve  regurgitation is not visualized. There is a 26 mm Sapien prosthetic (TAVR)  valve present in the aortic position. Procedure Date: 01/11/22. Aortic  valve area, by VTI measures 1.03 cm. Aortic  valve mean gradient measures 8.5 mmHg. Aortic valve Vmax measures 2.05  m/s.   6. The inferior vena cava is dilated in size with <50% respiratory  variability, suggesting right atrial pressure of 15 mmHg.   Comparison(s): Changes from prior study are noted. 01/12/2022: LVEF  20-25%, global HK, TAVR MG 5.7 mmHg.    Patient Profile     72 y.o. male a hx of  HTN, DM2, CKD 3A, PAD with prior bilateral popliteal stenting, carotid stenosis s/p bilateral TCAR, chronic HFrEF LVEF 20%/NICM, severe AS s/p TAVR 01/11/22 who presented with SOB and AKI. Cardiology is now consulted for further management of acute on chronic systolic HFrEF.  Assessment & Plan    Acute on chronic HFrEF NICM - 12/2021 echo: LVEF 20-25% - 01/2022 echo: LVEF 35-40%, global hypokinesis, grade I dd, normal RV, AVR no significant stenosis mild PVL - 10/2021 cath: mild to mod nonobstructive CAD. Mean PA 38, PCWP 19 CI inaccurate  - Presented on this admission with worsening SOB and AKI found to be volume overloaded with CXR +pulm edema, BNP >4500.   - TTE this admit  LVEF 25-30%, grade II dd, mild RV dysfunction, normal functionin TAVR - Currently responding well to lasix '80mg'$  IV BID and Cr stable - Continue coreg 12.'5mg'$  bid,  imdur 30. Hydralazine stopped due to hypotension - Entresto and jardaince on hold given AKI; will resume as able - Has not been started on spiro as outpatient due to elevated K; may consider in the future pending labs   AKI on CKD3B - Cr 2 months ago 1.4, 2 weeks ago 1.8. On admit 2.6 with peak to 2.7 this admit. Stable today at 2.11 - AKI likely multifactorial in the setting of acute on chronic HF and NSAID use  - Renal US negative - Responding well to IV diuresis as above; will continue - Renal consulted, appreciate recommendations   History of TAVR 12/2021 - Well functioning prosthesis on this admission; will continue surveillance as outpatient  Nonobstructive CAD Carotid artery stenosis s/p TCAR PAD with prior bilateral popliteral stenting -Continue ASA '81mg'$  daily, plavix '75mg'$  daily -Continue lipitor '40mg'$  daily  Hopefully can transition to PO diuretics tomorrow. Patient is very eager to go see his grandson in [place] on Thursday.     For questions or updates, please contact [place] Please consult [email or web address] for contact info under        Signed, Freada Bergeron, MD  05/31/2022, 10:54 AM

## 2022-06-01 ENCOUNTER — Encounter: Payer: Self-pay | Admitting: Internal Medicine

## 2022-06-01 DIAGNOSIS — N179 Acute kidney failure, unspecified: Secondary | ICD-10-CM | POA: Diagnosis not present

## 2022-06-01 DIAGNOSIS — N189 Chronic kidney disease, unspecified: Secondary | ICD-10-CM | POA: Diagnosis not present

## 2022-06-01 LAB — BASIC METABOLIC PANEL
Anion gap: 10 (ref 5–15)
BUN: 60 mg/dL — ABNORMAL HIGH (ref 8–23)
CO2: 31 mmol/L (ref 22–32)
Calcium: 9 mg/dL (ref 8.9–10.3)
Chloride: 92 mmol/L — ABNORMAL LOW (ref 98–111)
Creatinine, Ser: 1.7 mg/dL — ABNORMAL HIGH (ref 0.61–1.24)
GFR, Estimated: 42 mL/min — ABNORMAL LOW (ref >60)
Glucose, Bld: 207 mg/dL — ABNORMAL HIGH (ref 70–99)
Potassium: 4.9 mmol/L (ref 3.5–5.1)
Sodium: 133 mmol/L — ABNORMAL LOW (ref 135–145)

## 2022-06-01 LAB — GLUCOSE, CAPILLARY: Glucose-Capillary: 198 mg/dL — ABNORMAL HIGH (ref 70–99)

## 2022-06-01 MED ORDER — HYDROCODONE-ACETAMINOPHEN 5-325 MG PO TABS: 1.0000 | ORAL_TABLET | Freq: Four times a day (QID) | ORAL | 0 refills | Status: DC | PRN

## 2022-06-01 MED ORDER — POLYETHYLENE GLYCOL 3350 17 G PO PACK: 17.0000 g | PACK | Freq: Every day | ORAL | 0 refills | Status: DC

## 2022-06-01 MED ORDER — FUROSEMIDE 80 MG PO TABS: 80.0000 mg | ORAL_TABLET | Freq: Every day | ORAL | 2 refills | Status: DC

## 2022-06-01 NOTE — Inpatient Diabetes Management (Signed)
Inpatient Diabetes Program Recommendations  AACE/ADA: New Consensus Statement on Inpatient Glycemic Control (2015)  Target Ranges:  Prepandial:   less than 140 mg/dL      Peak postprandial:   less than 180 mg/dL (1-2 hours)      Critically ill patients:  140 - 180 mg/dL    Latest Reference Range & Units 05/31/22 07:51 05/31/22 12:04 05/31/22 16:26 05/31/22 21:05  Glucose-Capillary 70 - 99 mg/dL 211 (H)  8 units Novolog  10 units Semglee 137 (H)  5 units Novolog  128 (H)  5 units Novolog  202 (H)  2 units Novolog   (H): Data is abnormally high  Latest Reference Range & Units 06/01/22 07:38  Glucose-Capillary 70 - 99 mg/dL 198 (H)  6 units Novolog  10 units Semglee  (H): Data is abnormally high      Home DM Meds: Synjardy 25/1000 mg Daily        70/30 Insulin 8 units AM/ 7 units PM   Current Orders: Semglee 10 units Daily      Novolog Moderate Correction Scale/ SSI (0-15 units) TID AC + HS      Novolog 3 units TID with meals    MD- Note AM CBGs have been elevated the last 2 days  Please consider increasing the Semglee slightly to 12 units Daily    --Will follow patient during hospitalization--  Wyn Quaker RN, MSN, [place] Diabetes Coordinator Inpatient Glycemic Control Team Team Pager: [phone] (8a-5p)

## 2022-06-01 NOTE — Discharge Summary (Addendum)
[facility]  Physician Discharge Summary   Patient ID: Caleb [name] MRN: [id number] DOB/AGE: 72-Jan-1952 72 y.o.  Admit date: 05/27/2022 Discharge date: 06/01/2022    PCP: [name], MD  DISCHARGE DIAGNOSES:    Acute kidney injury superimposed on chronic kidney disease ([place])   Acute on chronic systolic and diastolic CHF (congestive heart failure) ([place])   Hypertensive urgency   Peripheral vascular disease ([place])   Type 2 diabetes mellitus with hyperlipidemia ([place])   Hyperlipidemia   Diabetic neuropathy ([place])   Decubitus ulcer of right heel   RECOMMENDATIONS FOR OUTPATIENT FOLLOW UP: Patient encouraged to keep his outpatient follow-up appointments with cardiology vascular surgery podiatry   Home Health: None Equipment/Devices: None  CODE STATUS: Full code  DISCHARGE CONDITION: fair  Diet recommendation: As before  INITIAL HISTORY: 72 y.o. male with PMH significant of chronic systolic CHF, EF 35 to 20%, G1 DD per 2D echo on 02/16/2022, diabetes mellitus type 2, GERD, HTN, HLP, diabetic neuropathy, aortic stenosis, TAVR in 12/2021, CKD stage IIIb presented to ED with shortness of breath, nausea and vomiting.  Patient reported that shortness of breath has been going on for at least a week, no chest pain, no significant peripheral edema.  This morning he woke up and started having nausea and vomiting with 4 episodes of emesis.  No hematemesis.  No abdominal pain or diarrhea.  He has right heel ulcer and has been seeing podiatry.  Patient reported that he had started tramadol and also has been taking a large amount of ibuprofen for the pain in the last week. Of note, patient was seen in cardiology office on 05/19/2022 and had recently increased his Lasix to 80 mg daily.   ED course:  In ED, temp 97.6, respiratory rate 17, pulse 91, BP 161/113, O2 sats 100% on room air Sodium 133, potassium 5.2, CO2 22, BUN 53, creatinine 2.62.  Creatinine was 1.8 on 05/12/2022.  Baseline  creatinine 1.2-1.3   HOSPITAL COURSE:    Acute kidney injury superimposed on chronic kidney disease 3B ([place]) -Baseline creatinine 1.2-1.3, most recently 1.8 on 05/12/2022.  On admission 2.62, BUN 53.   - Cr worsened acutely due to  NSAID's, nausea vomiting, intravascular depletion with dehydration, recently increased Lasix to 80 mg daily, and Entresto Patient was seen by nephrology.  Started on intravenous furosemide due to volume overload.  He diuresed well.  Renal function has improved.  Cleared by nephrology and cardiology for discharge today on once a day 80 mg of furosemide. Okay to resume Entresto per nephrology.   Acute on chronic systolic and diastolic CHF (congestive heart failure) ([place]), history of CAD -2D echo 01/2022 had shown EF of 35 to 40%, G1 DD, stable TAVR -Chest x-ray with pulmonary edema, elevated BNP > 4500, JVD -Appreciate cardiology and nephrology recommendations.  Started on IV Lasix with symptomatic improvement -2D echo showed EF 25 to 30%, global hypokinesis, G2 DD  Diuresed well after he was placed on 80 mg of furosemide intravenously twice a day.  Being discharged on once a day of 80 mg furosemide.  Cardiology to arrange outpatient follow-up.     Hypertensive urgency Improved with medications.   Nausea vomiting, dehydration Likely due to tramadol.  Now resolved.   Hyperkalemia Resolved     Peripheral vascular disease ([place]), carotid artery stenosis s/p bilateral TCAR -Continue Plavix, statin -Per patient, he is being worked up for upcoming vascular surgeries for PAD. - ABI's completed with moderate PAD on the right LE arterial  disease, mild on the left -Patient has an appointment with Dr. Gwenlyn Found on 06/08/2022 for further management   Type 2 diabetes mellitus with hyperlipidemia ([place]) Resume home medications.  Hyperlipidemia Continue Lipitor   Diabetic neuropathy ([facility])   Decubitus ulcer of right heel -Appears to have dry ulcer with eschar on the right  heel, no gangrene or drainage -Per patient he was taking excessive ibuprofen for the right heel ulcer and was been placed on tramadol by podiatry, Dr. Posey Pronto.   -Tramadol caused nausea and vomiting hence discontinued -Appreciate podiatry recommendations, wound care on the right heel.  Patient has appointment with his podiatrist, Dr. Posey Pronto on 06/07/2022. -ABIs completed, outpatient vascular workup with Dr. Gwenlyn Found, appointment on 06/08/2022    Severe AS status post TAVR in 10/23 -2D echo showed stable TAVR   Patient is feeling better.  Wants to go home today.  Discussed with nephrology and cardiology.  Okay for discharge.   PERTINENT LABS:  The results of significant diagnostics from this hospitalization (including imaging, microbiology, ancillary and laboratory) are listed below for reference.    Microbiology: Recent Results (from the past 240 hour(s))  Resp panel by RT-PCR (RSV, Flu A&B, Covid) Anterior Nasal Swab     Status: None   Collection Time: 05/27/22  3:22 PM   Specimen: Anterior Nasal Swab  Result Value Ref Range Status   SARS Coronavirus 2 by RT PCR NEGATIVE NEGATIVE Final    Comment: (NOTE) SARS-CoV-2 target nucleic acids are NOT DETECTED.  The SARS-CoV-2 RNA is generally detectable in upper respiratory specimens during the acute phase of infection. The lowest concentration of SARS-CoV-2 viral copies this assay can detect is 138 copies/mL. A negative result does not preclude SARS-Cov-2 infection and should not be used as the sole basis for treatment or other patient management decisions. A negative result may occur with  improper specimen collection/handling, submission of specimen other than nasopharyngeal swab, presence of viral mutation(s) within the areas targeted by this assay, and inadequate number of viral copies(<138 copies/mL). A negative result must be combined with clinical observations, patient history, and epidemiological information. The expected result is  Negative.  Fact Sheet for Patients:  EntrepreneurPulse.com.au  Fact Sheet for Healthcare Providers:  IncredibleEmployment.be  This test is no t yet approved or cleared by the [place] FDA and  has been authorized for detection and/or diagnosis of SARS-CoV-2 by FDA under an Emergency Use Authorization (EUA). This EUA will remain  in effect (meaning this test can be used) for the duration of the COVID-19 declaration under Section 564(b)(1) of the Act, 21 U.S.C.section 360bbb-3(b)(1), unless the authorization is terminated  or revoked sooner.       Influenza A by PCR NEGATIVE NEGATIVE Final   Influenza B by PCR NEGATIVE NEGATIVE Final    Comment: (NOTE) The Xpert Xpress SARS-CoV-2/FLU/RSV plus assay is intended as an aid in the diagnosis of influenza from Nasopharyngeal swab specimens and should not be used as a sole basis for treatment. Nasal washings and aspirates are unacceptable for Xpert Xpress SARS-CoV-2/FLU/RSV testing.  Fact Sheet for Patients: EntrepreneurPulse.com.au  Fact Sheet for Healthcare Providers: IncredibleEmployment.be  This test is not yet approved or cleared by the [place] FDA and has been authorized for detection and/or diagnosis of SARS-CoV-2 by FDA under an Emergency Use Authorization (EUA). This EUA will remain in effect (meaning this test can be used) for the duration of the COVID-19 declaration under Section 564(b)(1) of the Act, 21 U.S.C. section 360bbb-3(b)(1), unless the authorization is  terminated or revoked.     Resp Syncytial Virus by PCR NEGATIVE NEGATIVE Final    Comment: (NOTE) Fact Sheet for Patients: BloggerCourse.com  Fact Sheet for Healthcare Providers: SeriousBroker.it  This test is not yet approved or cleared by the [place] FDA and has been authorized for detection and/or diagnosis of  SARS-CoV-2 by FDA under an Emergency Use Authorization (EUA). This EUA will remain in effect (meaning this test can be used) for the duration of the COVID-19 declaration under Section 564(b)(1) of the Act, 21 U.S.C. section 360bbb-3(b)(1), unless the authorization is terminated or revoked.  Performed at [facility], [address] [place], [place] [address]      Labs:   Basic Metabolic Panel: Recent Labs  Lab 05/28/22 0429 05/29/22 0441 05/30/22 0340 05/31/22 0416 06/01/22 0726  NA 131* 134* 133* 133* 133*  K 4.6 4.5 3.9 3.7 4.9  CL 98 95* 94* 91* 92*  CO2 23 30 30 29 31   GLUCOSE 163* 154* 160* 196* 207*  BUN 62* 60* 57* 64* 60*  CREATININE 2.77* 2.52* 2.11* 2.11* 1.70*  CALCIUM 8.0* 8.3* 8.6* 8.8* 9.0   Liver Function Tests: Recent Labs  Lab 05/28/22 0429 05/28/22 0910 05/29/22 0441 05/30/22 0340 05/31/22 0416  AST 28 36 26 27 28   ALT 39 39 36 34 34  ALKPHOS 131* 135* 129* 128* 124  BILITOT 0.7 0.6 0.5 0.6 0.4  PROT 6.2* 6.7 7.2 7.4 7.5  ALBUMIN 2.8* 3.0* 3.1* 3.2* 3.3*    CBC: Recent Labs  Lab 05/27/22 1513 05/27/22 2118 05/28/22 0429 05/29/22 0441 05/30/22 0340  WBC 8.1 8.4 7.5 7.6 6.6  NEUTROABS 6.4  --   --   --   --   HGB 14.3 14.1 11.9* 14.1 14.7  HCT 45.7 44.8 37.6* 42.7 45.3  MCV 84.5 84.4 82.5 83.2 83.0  PLT 292 313 283 304 324    BNP: BNP (last 3 results) Recent Labs    10/27/21 1707 03/07/22 1145 05/27/22 1513  BNP 1,165.9* 728.7* >4,500.0*     CBG: Recent Labs  Lab 05/31/22 0751 05/31/22 1204 05/31/22 1626 05/31/22 2105 06/01/22 0738  GLUCAP 211* 137* 128* 202* 198*     IMAGING STUDIES VAS 07/31/22 ABI WITH/WO TBI  Result Date: 05/29/2022  LOWER EXTREMITY DOPPLER STUDY Patient Name:  Caleb [name]  Date of Exam:   05/29/2022 Medical Rec #: [name]        Accession #:    07/29/2022 Date of Birth: 11-03-1950         Patient Gender: M Patient Age:   48 years Exam Location[facility]  [facility]  Procedure:      VAS 61 ABI WITH/WO TBI Referring Phys: RIPUDEEP RAI --------------------------------------------------------------------------------  Indications: Peripheral artery disease. High Risk Factors: Hyperlipidemia, Diabetes.  Comparison Study: 06/03/2020 - Right: Resting right ankle-brachial index is                   within normal range. No                   evidence of significant right lower extremity arterial                   disease. The right                   toe-brachial index is normal.                    Left:  Resting left ankle-brachial index indicates moderate                   left lower                   extremity arterial disease. The left toe-brachial index is                   abnormal.                    05/19/2021 - Right: Resting right ankle-brachial index indicates                   moderate right lower                   extremity arterial disease. The right toe-brachial index is                   abnormal.                    Left: Resting left ankle-brachial index indicates moderate                   left lower                   extremity arterial disease. The left toe-brachial index is                   abnormal. Performing Technologist: Olen Cordial RVT  Examination Guidelines: A complete evaluation includes at minimum, Doppler waveform signals and systolic blood pressure reading at the level of bilateral brachial, anterior tibial, and posterior tibial arteries, when vessel segments are accessible. Bilateral testing is considered an integral part of a complete examination. Photoelectric Plethysmograph (PPG) waveforms and toe systolic pressure readings are included as required and additional duplex testing as needed. Limited examinations for reoccurring indications may be performed as noted.  ABI Findings: +--------+------------------+-----+----------+--------+ Right   Rt Pressure (mmHg)IndexWaveform  Comment  +--------+------------------+-----+----------+--------+ [id number]                     triphasic          +--------+------------------+-----+----------+--------+ PTA     88                0.71 monophasic         +--------+------------------+-----+----------+--------+ DP      74                0.60 monophasic         +--------+------------------+-----+----------+--------+ +---------+------------------+-----+----------+-------+ Left     Lt Pressure (mmHg)IndexWaveform  Comment +---------+------------------+-----+----------+-------+ Brachial 116                    triphasic         +---------+------------------+-----+----------+-------+ PTA      116               0.94 monophasic        +---------+------------------+-----+----------+-------+ DP       91                0.73 monophasic        +---------+------------------+-----+----------+-------+ Great Toe54                0.44                   +---------+------------------+-----+----------+-------+ +-------+-----------+-----------+------------+------------+ ABI/TBIToday's ABIToday's TBIPrevious ABIPrevious TBI +-------+-----------+-----------+------------+------------+ Right  0.71  0.63        0.62         +-------+-----------+-----------+------------+------------+ Left   0.94       0.44       0.76        0.66         +-------+-----------+-----------+------------+------------+  Summary: Right: Resting right ankle-brachial index indicates moderate right lower extremity arterial disease. Unable to obtain TBI due to low amplitude/absent waveforms. Left: Resting left ankle-brachial index indicates mild left lower extremity arterial disease. The left toe-brachial index is abnormal. ABI's are likely falsely elevated due to medial calcification. *See table(s) above for measurements and observations.  Electronically signed by Jamelle Haring on 05/29/2022 at 4:33:22 PM.    Final    DG CHEST PORT 1 VIEW  Result Date: 05/29/2022 CLINICAL DATA:  73 year old male with  history of pulmonary edema. EXAM: PORTABLE CHEST 1 VIEW COMPARISON:  Chest x-ray 05/2022. FINDINGS: Lung volumes are normal. No consolidative airspace disease. No pleural effusions. No pneumothorax. No evidence of pulmonary edema. Heart size is mildly enlarged. Upper mediastinal contours are within normal limits. Status post TAVR. IMPRESSION: 1. No radiographic evidence of acute cardiopulmonary disease. 2. Mild cardiomegaly (unchanged). 3. Status post TAVR. Electronically Signed   By: Vinnie Langton M.D.   On: 05/29/2022 10:06   ECHOCARDIOGRAM COMPLETE  Result Date: 05/28/2022    ECHOCARDIOGRAM REPORT   Patient Name:   Caleb [name] Date of Exam: 05/28/2022 Medical Rec #:  [id number]       Height:       67.0 in Accession #:    [id number]      Weight:       185.0 lb Date of Birth:  1950-04-08        BSA:          1.956 m Patient Age:    89 years        BP:           114/86 mmHg Patient Gender: M               HR:           92 bpm. Exam Location:  Inpatient Procedure: 2D Echo, Color Doppler and Cardiac Doppler Indications:    dyspnea  History:        Patient has prior history of Echocardiogram examinations, most                 recent 02/16/2022. CHF, PVD. chronic kidney disease.; Risk                 Factors:Diabetes, Hypertension and Dyslipidemia.                 Aortic Valve: 26 mm Sapien prosthetic, stented (TAVR) valve is                 present in the aortic position. Procedure Date: 01/11/22.  Sonographer:    Johny Chess RDCS Referring Phys: [id number] [place]  1. Left ventricular ejection fraction, by estimation, is 25 to 30%. Left ventricular ejection fraction by 2D MOD biplane is 26.8 %. The left ventricle has severely decreased function. The left ventricle demonstrates global hypokinesis. There is mild left ventricular hypertrophy. Left ventricular diastolic parameters are consistent with Grade II diastolic dysfunction (pseudonormalization). Elevated left ventricular  end-diastolic pressure. The E/e' is 62. There is severe akinesis of the left ventricular, mid-apical anteroseptal wall, septal wall, anterior wall, inferoapical segment and anterolateral wall.  2. Right  ventricular systolic function is mildly reduced. The right ventricular size is normal. There is moderately elevated pulmonary artery systolic pressure. The estimated right ventricular systolic pressure is 0000000 mmHg.  3. Left atrial size was mildly dilated.  4. The mitral valve is abnormal. Mild mitral valve regurgitation.  5. The aortic valve has been repaired/replaced. Aortic valve regurgitation is not visualized. There is a 26 mm Sapien prosthetic (TAVR) valve present in the aortic position. Procedure Date: 01/11/22. Aortic valve area, by VTI measures 1.03 cm. Aortic valve mean gradient measures 8.5 mmHg. Aortic valve Vmax measures 2.05 m/s.  6. The inferior vena cava is dilated in size with <50% respiratory variability, suggesting right atrial pressure of 15 mmHg. Comparison(s): Changes from prior study are noted. 01/12/2022: LVEF 20-25%, global HK, TAVR MG 5.7 mmHg. FINDINGS  Left Ventricle: Left ventricular ejection fraction, by estimation, is 25 to 30%. Left ventricular ejection fraction by 2D MOD biplane is 26.8 %. The left ventricle has severely decreased function. The left ventricle demonstrates global hypokinesis. Severe akinesis of the left ventricular, mid-apical anteroseptal wall, septal wall, anterior wall, inferoapical segment and anterolateral wall. The left ventricular internal cavity size was normal in size. There is mild left ventricular hypertrophy. Left  ventricular diastolic parameters are consistent with Grade II diastolic dysfunction (pseudonormalization). Elevated left ventricular end-diastolic pressure. The E/e' is 60.  LV Wall Scoring: The mid and distal anterior septum, entire apex, and mid inferoseptal segment are akinetic. Right Ventricle: The right ventricular size is normal. No  increase in right ventricular wall thickness. Right ventricular systolic function is mildly reduced. There is moderately elevated pulmonary artery systolic pressure. The tricuspid regurgitant velocity is 3.24 m/s, and with an assumed right atrial pressure of 15 mmHg, the estimated right ventricular systolic pressure is 0000000 mmHg. Left Atrium: Left atrial size was mildly dilated. Right Atrium: Right atrial size was normal in size. Pericardium: There is no evidence of pericardial effusion. Mitral Valve: The mitral valve is abnormal. There is mild calcification of the posterior mitral valve leaflet(s). Mild to moderate mitral annular calcification. Mild mitral valve regurgitation. MV peak gradient, 6.4 mmHg. The mean mitral valve gradient is 3.0 mmHg. Tricuspid Valve: The tricuspid valve is grossly normal. Tricuspid valve regurgitation is mild. Aortic Valve: The aortic valve has been repaired/replaced. Aortic valve regurgitation is not visualized. Aortic valve mean gradient measures 8.5 mmHg. Aortic valve peak gradient measures 16.9 mmHg. Aortic valve area, by VTI measures 1.03 cm. There is a 26 mm Sapien prosthetic, stented (TAVR) valve present in the aortic position. Procedure Date: 01/11/22. Pulmonic Valve: The pulmonic valve was grossly normal. Pulmonic valve regurgitation is trivial. Aorta: The aortic root and ascending aorta are structurally normal, with no evidence of dilitation. Venous: The inferior vena cava is dilated in size with less than 50% respiratory variability, suggesting right atrial pressure of 15 mmHg. IAS/Shunts: No atrial level shunt detected by color flow Doppler.  LEFT VENTRICLE PLAX 2D                        Biplane EF (MOD) LVIDd:         5.10 cm         LV Biplane EF:   Left LVIDs:         4.40 cm                          ventricular LV PW:  1.10 cm                          ejection LV IVS:        1.10 cm                          fraction by LVOT diam:     1.70 cm                           2D MOD LV SV:         38                               biplane is LV SV Index:   19                               26.8 %. LVOT Area:     2.27 cm                                Diastology                                LV e' medial:    4.24 cm/s LV Volumes (MOD)               LV E/e' medial:  25.7 LV vol d, MOD    195.5 ml      LV e' lateral:   5.22 cm/s A2C:                           LV E/e' lateral: 20.9 LV vol d, MOD    153.1 ml A4C: LV vol s, MOD    145.0 ml A2C: LV vol s, MOD    128.6 ml A4C: LV SV MOD A2C:   50.5 ml LV SV MOD A4C:   153.1 ml LV SV MOD BP:    47.8 ml RIGHT VENTRICLE            IVC RV Basal diam:  2.90 cm    IVC diam: 2.60 cm RV S prime:     9.57 cm/s TAPSE (M-mode): 0.8 cm LEFT ATRIUM             Index        RIGHT ATRIUM           Index LA diam:        4.00 cm 2.04 cm/m   RA Area:     15.80 cm LA Vol (A2C):   70.5 ml 36.04 ml/m  RA Volume:   42.70 ml  21.83 ml/m LA Vol (A4C):   59.3 ml 30.31 ml/m LA Biplane Vol: 70.2 ml 35.89 ml/m  AORTIC VALVE AV Area (Vmax):    1.02 cm AV Area (Vmean):   1.02 cm AV Area (VTI):     1.03 cm AV Vmax:           205.50 cm/s AV Vmean:          134.000 cm/s AV VTI:            0.364 m AV Peak Grad:  16.9 mmHg AV Mean Grad:      8.5 mmHg LVOT Vmax:         92.20 cm/s LVOT Vmean:        60.450 cm/s LVOT VTI:          0.166 m LVOT/AV VTI ratio: 0.45  AORTA Ao Root diam: 3.30 cm Ao Asc diam:  3.10 cm MITRAL VALVE                TRICUSPID VALVE MV Area (PHT): 5.66 cm     TR Peak grad:   42.0 mmHg MV Area VTI:   1.42 cm     TR Vmax:        324.00 cm/s MV Peak grad:  6.4 mmHg MV Mean grad:  3.0 mmHg     SHUNTS MV Vmax:       1.26 m/s     Systemic VTI:  0.17 m MV Vmean:      87.4 cm/s    Systemic Diam: 1.70 cm MV Decel Time: 134 msec MV E velocity: 109.00 cm/s MV A velocity: 85.90 cm/s MV E/A ratio:  1.27 Lyman Bishop MD Electronically signed by Lyman Bishop MD Signature Date/Time: 05/28/2022/6:48:53 PM    Final    US RENAL  Result Date:  05/27/2022 CLINICAL DATA:  Acute kidney injury EXAM: RENAL / URINARY TRACT ULTRASOUND COMPLETE COMPARISON:  CT 11/16/2021 FINDINGS: Right Kidney: Renal measurements: 10.1 x 5.1 x 5.6 cm = volume: 149 mL. Echogenicity within normal limits. No mass or hydronephrosis visualized. Left Kidney: Renal measurements: 10.9 x 5.8 x 5.5 cm = volume: 183 mL. Echogenicity within normal limits. No mass or hydronephrosis visualized. Bladder: Appears normal for degree of bladder distention. Other: None. IMPRESSION: Unremarkable sonographic appearance of the kidneys. Electronically Signed   By: Davina Poke D.O.   On: 05/27/2022 18:33   DG Chest 2 View  Result Date: 05/27/2022 CLINICAL DATA:  Shortness of breath EXAM: CHEST - 2 VIEW COMPARISON:  01/07/2022 FINDINGS: Mild cardiomegaly. TAVR. Pulmonary vascular congestion with mild diffuse interstitial prominence. No pleural effusion. No pneumothorax. IMPRESSION: Mild cardiomegaly with pulmonary vascular congestion and mild diffuse interstitial prominence, suggesting CHF with mild interstitial edema. Electronically Signed   By: Davina Poke D.O.   On: 05/27/2022 16:02   CT ANGIO HEAD W OR WO CONTRAST  Result Date: 05/16/2022 CLINICAL DATA:  Carotid stenosis. EXAM: CT ANGIOGRAPHY HEAD AND NECK TECHNIQUE: Multidetector CT imaging of the head and neck was performed using the standard protocol during bolus administration of intravenous contrast. Multiplanar CT image reconstructions and MIPs were obtained to evaluate the vascular anatomy. Carotid stenosis measurements (when applicable) are obtained utilizing NASCET criteria, using the distal internal carotid diameter as the denominator. RADIATION DOSE REDUCTION: This exam was performed according to the departmental dose-optimization program which includes automated exposure control, adjustment of the mA and/or kV according to patient size and/or use of iterative reconstruction technique. CONTRAST:  86m OMNIPAQUE IOHEXOL 350  MG/ML SOLN COMPARISON:  11/13/2019 FINDINGS: CT HEAD FINDINGS Brain: Brain does not show advanced atrophy for age. Minimal chronic small-vessel ischemic changes affect the hemispheric white matter. No sign of acute infarction. No large vessel territory infarction. No mass, hemorrhage, hydrocephalus or extra-axial collection. Vascular: There is atherosclerotic calcification of the major vessels at the base of the brain. Skull: Negative Sinuses/Orbits: Clear/normal Other: None Review of the MIP images confirms the above findings CTA NECK FINDINGS Aortic arch: Aortic atherosclerosis. The branching pattern is normal. No brachiocephalic vessel origin stenosis. Right  carotid system: Common carotid artery is tortuous but widely patent to the bifurcation. There is a carotid artery stent extending from the distal common carotid artery, through the carotid bifurcation and through the ICA bulb region. Calcified plaque at the bifurcation and proximal ICA again demonstrated. There is waisting of the stent at the proximal ICA, with minimal diameter of 2.6 mm. There appears to be a small amount of intimal thickening in this location. More distal cervical ICA diameter is 3.5 mm, indicating stenosis of 25%. Left carotid system: Common carotid artery widely patent to the distal extent. Carotid artery stent extending from the distal common carotid artery, through the bifurcation and through the ICA bulb region. There is severe narrowing of the lumen at the proximal ICA level, diameter only 0.8-1 mm. The distal ICA measures only 3.2 mm, probably small because of in flow reduction. This recurrent stenosis is probably functionally on the order 80%. Vertebral arteries: Both vertebral artery origins are widely patent. Both vertebral arteries appear normal through the cervical region to the foramen magnum. Skeleton: Ordinary degenerative spondylosis. Other neck: No mass or lymphadenopathy. Upper chest: Lung apices are clear. Review of the  MIP images confirms the above findings CTA HEAD FINDINGS Anterior circulation: Both internal carotid arteries are patent through the skull base and siphon regions. Mild siphon atherosclerotic calcification but no stenosis greater than 30%. The anterior and middle cerebral vessels are patent. No large vessel occlusion or proximal stenosis. No aneurysm or vascular malformation. Posterior circulation: Both vertebral arteries are patent through the foramen magnum to the basilar artery. No basilar stenosis. Superior cerebellar and posterior cerebral arteries are patent. There are patent posterior communicating arteries. Venous sinuses: Patent and normal. Anatomic variants: None significant. Review of the MIP images confirms the above findings IMPRESSION: 1. Right carotid artery stent extending from the distal common carotid artery, through the carotid bifurcation and through the ICA bulb region. There is waisting of the stent at the proximal ICA level with mild soft plaque/intimal thickening and minimal diameter of 2.6 mm. More distal cervical ICA diameter is 3.5 mm, indicating stenosis of 25%. 2. Left carotid artery stent extending from the distal common carotid artery, through the bifurcation and through the ICA bulb region. There is severe narrowing of the lumen at the proximal ICA level by soft plaque, diameter only 0.8-1 mm. The distal ICA measures only 3.2 mm, probably small because of inflow reduction. This recurrent stenosis is probably functionally on the order of 80%. 3. No intracranial large vessel occlusion or proximal stenosis. Aortic Atherosclerosis (ICD10-I70.0). Electronically Signed   By: Nelson Chimes M.D.   On: 05/16/2022 08:48   CT ANGIO NECK W OR WO CONTRAST  Result Date: 05/16/2022 CLINICAL DATA:  Carotid stenosis. EXAM: CT ANGIOGRAPHY HEAD AND NECK TECHNIQUE: Multidetector CT imaging of the head and neck was performed using the standard protocol during bolus administration of intravenous  contrast. Multiplanar CT image reconstructions and MIPs were obtained to evaluate the vascular anatomy. Carotid stenosis measurements (when applicable) are obtained utilizing NASCET criteria, using the distal internal carotid diameter as the denominator. RADIATION DOSE REDUCTION: This exam was performed according to the departmental dose-optimization program which includes automated exposure control, adjustment of the mA and/or kV according to patient size and/or use of iterative reconstruction technique. CONTRAST:  20m OMNIPAQUE IOHEXOL 350 MG/ML SOLN COMPARISON:  11/13/2019 FINDINGS: CT HEAD FINDINGS Brain: Brain does not show advanced atrophy for age. Minimal chronic small-vessel ischemic changes affect the hemispheric white matter. No sign of  acute infarction. No large vessel territory infarction. No mass, hemorrhage, hydrocephalus or extra-axial collection. Vascular: There is atherosclerotic calcification of the major vessels at the base of the brain. Skull: Negative Sinuses/Orbits: Clear/normal Other: None Review of the MIP images confirms the above findings CTA NECK FINDINGS Aortic arch: Aortic atherosclerosis. The branching pattern is normal. No brachiocephalic vessel origin stenosis. Right carotid system: Common carotid artery is tortuous but widely patent to the bifurcation. There is a carotid artery stent extending from the distal common carotid artery, through the carotid bifurcation and through the ICA bulb region. Calcified plaque at the bifurcation and proximal ICA again demonstrated. There is waisting of the stent at the proximal ICA, with minimal diameter of 2.6 mm. There appears to be a small amount of intimal thickening in this location. More distal cervical ICA diameter is 3.5 mm, indicating stenosis of 25%. Left carotid system: Common carotid artery widely patent to the distal extent. Carotid artery stent extending from the distal common carotid artery, through the bifurcation and through the  ICA bulb region. There is severe narrowing of the lumen at the proximal ICA level, diameter only 0.8-1 mm. The distal ICA measures only 3.2 mm, probably small because of in flow reduction. This recurrent stenosis is probably functionally on the order 80%. Vertebral arteries: Both vertebral artery origins are widely patent. Both vertebral arteries appear normal through the cervical region to the foramen magnum. Skeleton: Ordinary degenerative spondylosis. Other neck: No mass or lymphadenopathy. Upper chest: Lung apices are clear. Review of the MIP images confirms the above findings CTA HEAD FINDINGS Anterior circulation: Both internal carotid arteries are patent through the skull base and siphon regions. Mild siphon atherosclerotic calcification but no stenosis greater than 30%. The anterior and middle cerebral vessels are patent. No large vessel occlusion or proximal stenosis. No aneurysm or vascular malformation. Posterior circulation: Both vertebral arteries are patent through the foramen magnum to the basilar artery. No basilar stenosis. Superior cerebellar and posterior cerebral arteries are patent. There are patent posterior communicating arteries. Venous sinuses: Patent and normal. Anatomic variants: None significant. Review of the MIP images confirms the above findings IMPRESSION: 1. Right carotid artery stent extending from the distal common carotid artery, through the carotid bifurcation and through the ICA bulb region. There is waisting of the stent at the proximal ICA level with mild soft plaque/intimal thickening and minimal diameter of 2.6 mm. More distal cervical ICA diameter is 3.5 mm, indicating stenosis of 25%. 2. Left carotid artery stent extending from the distal common carotid artery, through the bifurcation and through the ICA bulb region. There is severe narrowing of the lumen at the proximal ICA level by soft plaque, diameter only 0.8-1 mm. The distal ICA measures only 3.2 mm, probably small  because of inflow reduction. This recurrent stenosis is probably functionally on the order of 80%. 3. No intracranial large vessel occlusion or proximal stenosis. Aortic Atherosclerosis (ICD10-I70.0). Electronically Signed   By: Nelson Chimes M.D.   On: 05/16/2022 08:48   DG Foot Complete Right  Result Date: 05/12/2022 CLINICAL DATA:  Diabetic foot ulcer to assess for possible osteo. Right heel pain. EXAM: RIGHT FOOT COMPLETE - 3+ VIEW COMPARISON:  09/17/2019 FINDINGS: Degenerative changes in the interphalangeal joints, first metatarsal-phalangeal joint, and intertarsal joints. Small plantar calcaneal spur. No evidence of acute fracture or dislocation. No focal bone lesions. Soft tissue defect over the posterior calcaneus corresponding to the history of ulceration. No underlying bone changes to suggest osteomyelitis. Prominent vascular calcifications. IMPRESSION:  Degenerative changes in the right foot. Soft tissue ulceration over the posterior calcaneus. No radiographic evidence of osteomyelitis. Electronically Signed   By: Lucienne Capers M.D.   On: 05/12/2022 17:46    DISCHARGE EXAMINATION: Vitals:   05/31/22 1832 05/31/22 2101 06/01/22 0114 06/01/22 0530  BP:  134/89  (!) 154/99  Pulse:  85  98  Resp: '17 18  17  '$ Temp:  97.8 F (36.6 C)  98.8 F (37.1 C)  TempSrc:  Oral    SpO2:  100%  100%  Weight:   77.4 kg   Height:       General appearance: Awake alert.  In no distress Resp: Clear to auscultation bilaterally.  Normal effort Cardio: S1-S2 is normal regular.  No S3-S4.  No rubs murmurs or bruit GI: Abdomen is soft.  Nontender nondistended.  Bowel sounds are present normal.  No masses organomegaly  DISPOSITION: Home  Discharge Instructions     (HEART FAILURE PATIENTS) Call MD:  Anytime you have any of the following symptoms: 1) 3 pound weight gain in 24 hours or 5 pounds in 1 week 2) shortness of breath, with or without a dry hacking cough 3) swelling in the hands, feet or stomach 4)  if you have to sleep on extra pillows at night in order to breathe.   Complete by: As directed    Call MD for:  difficulty breathing, headache or visual disturbances   Complete by: As directed    Call MD for:  extreme fatigue   Complete by: As directed    Call MD for:  persistant dizziness or light-headedness   Complete by: As directed    Call MD for:  persistant nausea and vomiting   Complete by: As directed    Call MD for:  severe uncontrolled pain   Complete by: As directed    Call MD for:  temperature >100.4   Complete by: As directed    Diet - low sodium heart healthy   Complete by: As directed    Discharge instructions   Complete by: As directed    Please take your medications as prescribed.  Follow-up appointments will be made for you with your heart doctor.  Please be sure to follow-up with your primary care provider in 1 week.  You were cared for by a hospitalist during your hospital stay. If you have any questions about your discharge medications or the care you received while you were in the hospital after you are discharged, you can call the unit and asked to speak with the hospitalist on call if the hospitalist that took care of you is not available. Once you are discharged, your primary care physician will handle any further medical issues. Please note that NO REFILLS for any discharge medications will be authorized once you are discharged, as it is imperative that you return to your primary care physician (or establish a relationship with a primary care physician if you do not have one) for your aftercare needs so that they can reassess your need for medications and monitor your lab values. If you do not have a primary care physician, you can call [phone] for a physician referral.   Increase activity slowly   Complete by: As directed    No wound care   Complete by: As directed          Allergies as of 06/01/2022       Reactions   Codeine Nausea And Vomiting    Percocet [oxycodone-acetaminophen] Nausea  And Vomiting   Tramadol Hcl Nausea And Vomiting        Medication List     STOP taking these medications    amoxicillin-clavulanate 875-125 MG tablet Commonly known as: AUGMENTIN   doxycycline 100 MG capsule Commonly known as: VIBRAMYCIN   traMADol 50 MG tablet Commonly known as: ULTRAM       TAKE these medications    aspirin EC 81 MG tablet Take 81 mg by mouth in the morning.   atorvastatin 40 MG tablet Commonly known as: LIPITOR Take 40 mg by mouth in the morning.   carvedilol 12.5 MG tablet Commonly known as: COREG Take 1 tablet (12.5 mg total) by mouth 2 (two) times daily with a meal.   clopidogrel 75 MG tablet Commonly known as: PLAVIX Take 1 tablet (75 mg total) by mouth daily.   cyanocobalamin 500 MCG tablet Commonly known as: VITAMIN B12 Take 500 mcg by mouth in the morning.   Entresto 49-51 MG Generic drug: sacubitril-valsartan Take 1 tablet by mouth 2 (two) times daily.   ferrous sulfate 325 (65 FE) MG tablet Take 325 mg by mouth in the morning.   furosemide 80 MG tablet Commonly known as: LASIX Take 1 tablet (80 mg total) by mouth daily. What changed: how much to take   gabapentin 800 MG tablet Commonly known as: NEURONTIN Take 800 mg by mouth at bedtime as needed (Sleep).   hydrALAZINE 25 MG tablet Commonly known as: APRESOLINE Take 1 tablet (25 mg total) by mouth 3 (three) times daily.   HYDROcodone-acetaminophen 5-325 MG tablet Commonly known as: NORCO/VICODIN Take 1 tablet by mouth every 6 (six) hours as needed for moderate pain.   insulin NPH-regular Human (70-30) 100 UNIT/ML injection Inject 8 Units into the skin daily with breakfast. 7 units with dinner   isosorbide mononitrate 30 MG 24 hr tablet Commonly known as: IMDUR Take 1 tablet (30 mg total) by mouth daily.   polyethylene glycol 17 g packet Commonly known as: MIRALAX / GLYCOLAX Take 17 g by mouth daily. Start taking on:  June 02, 2022   PRESCRIPTION MEDICATION Inject 0.1-1 mLs as directed See admin instructions. Tri-Mix Standard Strength 5 ml. Formula: Prostaglandin 10 mcg/ml, Papaverine 30 mg/ml, Phentolamine 1 mg/ml. Inject 0.1 ml into side of penis as directed as needed (to be injected immediately before sexual intercourse) may increase the dose by 0.1 ml every 48 hours to achieve an erection. Max dose 1 ml.   Synjardy XR 25-1000 MG Tb24 Generic drug: Empagliflozin-metFORMIN HCl ER Take 1 tablet by mouth in the morning.          Follow-up Information     [name], MD Follow up in 1 week(s).   Specialty: Family Medicine Why: post hospitalization follow up Contact information: [place] [address] [phone]         Lorretta Harp, MD Follow up.   Specialties: Cardiology, Radiology Why: office will arrange f/u Contact information: [address] [place] [address] [phone]                 [place]: 35 minutes  [place]  [facility] Pager on [email or web address]  06/01/2022, 5:12 PM

## 2022-06-01 NOTE — Progress Notes (Signed)
AVS given to patient and explained at the bedside. Medications and follow up appointments have been explained with pt verbalizing understanding. Home medications returned from pharmacy

## 2022-06-02 ENCOUNTER — Telehealth: Payer: Self-pay | Admitting: Emergency Medicine

## 2022-06-02 ENCOUNTER — Telehealth: Payer: Self-pay

## 2022-06-02 ENCOUNTER — Encounter: Payer: Self-pay | Admitting: Physician Assistant

## 2022-06-02 ENCOUNTER — Other Ambulatory Visit (HOSPITAL_COMMUNITY): Payer: Self-pay | Admitting: Physician Assistant

## 2022-06-02 ENCOUNTER — Telehealth: Payer: Self-pay | Admitting: Family Medicine

## 2022-06-02 DIAGNOSIS — I739 Peripheral vascular disease, unspecified: Secondary | ICD-10-CM

## 2022-06-02 NOTE — Telephone Encounter (Signed)
Patient said he needs to stop taking EMPAGLIFLOZIN-METFORMIN.. HE also need pain meds

## 2022-06-02 NOTE — Telephone Encounter (Signed)
Patient request call to discuss his medications patient was recently in hospital and told to stop taking certain medications, patient also asked about a new prescription.

## 2022-06-02 NOTE — Transitions of Care (Post Inpatient/ED Visit) (Signed)
   06/02/2022  Name: Caleb [name] MRN: [id number] DOB: 1950-06-15  Today's TOC FU Call Status: Today's TOC FU Call Status:: Successful TOC FU Call Competed TOC FU Call Complete Date: 06/02/22  Transition Care Management Follow-up Telephone Call Date of Discharge: 06/01/22 Discharge Facility: [facility]) Type of Discharge: Inpatient Admission Primary Inpatient Discharge Diagnosis:: "AKI" How have you been since you were released from the hospital?: Better (Patient voices he rested well last night and has been "doing wonderful' since returning home.) Any questions or concerns?: Yes Patient Questions/Concerns:: Patient states he has not been able to get a hold of his PCP office to request refills on some meds and make appts- Patient Questions/Concerns Addressed: Other: (verified with what number for practice he was dialing-pt was not calling the number listed. He states that he had already decided to drive to office and was a block away during this call and will just go into office to discuss with front desk staff)  Items Reviewed: Did you receive and understand the discharge instructions provided?: Yes Medications obtained and verified?: No (pt currently driving-unable to complete med review) Any new allergies since your discharge?: No Dietary orders reviewed?: Yes Type of Diet Ordered:: carb modified Do you have support at home?: No  Home Care and Equipment/Supplies: [place] Ordered?: NA Any new equipment or medical supplies ordered?: NA  Functional Questionnaire: Do you need assistance with bathing/showering or dressing?: No Do you need assistance with meal preparation?: No Do you need assistance with eating?: No Do you have difficulty maintaining continence: No Do you need assistance with getting out of bed/getting out of a chair/moving?: No Do you have difficulty managing or taking your medications?: No  Folllow up appointments reviewed: PCP Follow-up  appointment confirmed?: No (pt will make appt while at office today) MD Provider Line Number:[phone] Given: No Specialist Hospital Follow-up appointment confirmed?: Yes Date of Specialist follow-up appointment?: 06/07/22 Follow-Up Specialty Provider:: Dr. Posey Pronto, Dr.Berry-06/08/22 Do you need transportation to your follow-up appointment?: No Do you understand care options if your condition(s) worsen?: Yes-patient verbalized understanding  SDOH Interventions Today    Flowsheet Row Most Recent Value  SDOH Interventions   Food Insecurity Interventions Intervention Not Indicated  Transportation Interventions Intervention Not Indicated       TOC Interventions Today    Flowsheet Row Most Recent Value  TOC Interventions   TOC Interventions Discussed/Reviewed TOC Interventions Discussed      Interventions Today    Flowsheet Row Most Recent Value  General Interventions   General Interventions Discussed/Reviewed General Interventions Discussed, Doctor Visits  Doctor Visits Discussed/Reviewed PCP  PCP/Specialist Visits Compliance with follow-up visit  Education Interventions   Education Provided Provided Education  Provided Verbal Education On Medication, When to see the doctor, Other, Nutrition  Nutrition Interventions   Nutrition Discussed/Reviewed Nutrition Discussed, Carbohydrate meal planning, Decreasing sugar intake  Pharmacy Interventions   Pharmacy Dicussed/Reviewed Pharmacy Topics Discussed, Medications and their functions  Safety Interventions   Safety Discussed/Reviewed Safety Discussed        Hetty Blend [facility] Care Management Care Management Community Coordinator Direct Phone: [phone] Toll Free: [phone] Fax: [phone]

## 2022-06-03 ENCOUNTER — Ambulatory Visit: Payer: Self-pay | Admitting: *Deleted

## 2022-06-03 ENCOUNTER — Encounter (HOSPITAL_COMMUNITY): Payer: Medicare HMO | Admitting: Cardiology

## 2022-06-03 NOTE — Telephone Encounter (Signed)
Please have him schedule a follow up visit to discuss.

## 2022-06-03 NOTE — Telephone Encounter (Signed)
Patient has been scheduled

## 2022-06-03 NOTE — Telephone Encounter (Signed)
Patient has recent hospital stay- he is following up with all speciality providers and has upcoming appointment with PCP in May. Patient is requesting medication changes before that appointment- see notes.  Chief Complaint: medication change Symptoms: kidney concerns- advised while in hospital- needs to stop metformin  Disposition: [] ED /[] Urgent Care (no appt availability in office) / [] Appointment(In office/virtual)/ []  [place] [facility]/ [] Home Care/ [] Refused Recommended Disposition /[] [place] [facility]/ [x]  Follow-up with PCP Additional Notes: Patient advised I would send request- with concern- patient is also requesting Ibuprofen Rx- he states stronger medications cause him nausea.  Reason for Disposition . [1] Caller has URGENT medicine question about med that PCP or specialist prescribed AND [2] triager unable to answer question  Protocols used: Medication Question Call-A-AH

## 2022-06-03 NOTE — Telephone Encounter (Signed)
Summary: Medication Changes   Patient is requesting changes to his current medications.         Patient was discharged from hospital Wednesday- patient advised to stop metformin combination medication  Answer Assessment - Initial Assessment Questions 1. NAME of MEDICINE: "What medicine(s) are you calling about?"     Empagliflozin-metFORMIN HCl ER (SYNJARDY XR) 25-1000     Ibuprofen 800mg  2. QUESTION: "What is your question?" (e.g., double dose of medicine, side effect)     Patient was advised from ED/hospital to stop combination metformin(kidney protection) and just take Jardiance- he would like new Rx for that. Patient is also requesting Rx for Ibuprofen 800 mg for his foot pain-other pain relievers make him nauseous.  3. PRESCRIBER: "Who prescribed the medicine?" Reason: if prescribed by specialist, call should be referred to that group.     PCP  Patient is requesting medication changes- see notes- he would like new medication Rx sent to [facility] [place].  Protocols used: Medication Question Call-A-AH

## 2022-06-03 NOTE — Telephone Encounter (Signed)
Yes

## 2022-06-03 NOTE — Telephone Encounter (Signed)
Can appointment be virtual?

## 2022-06-04 ENCOUNTER — Encounter: Payer: Self-pay | Admitting: Podiatry

## 2022-06-04 ENCOUNTER — Other Ambulatory Visit: Payer: Self-pay | Admitting: Physician Assistant

## 2022-06-06 ENCOUNTER — Ambulatory Visit: Payer: Self-pay

## 2022-06-06 NOTE — Patient Instructions (Signed)
Visit Information  Thank you for taking time to visit with me today. Please don't hesitate to contact me if I can be of assistance to you.   Following are the goals we discussed today:   Goals Addressed             This Visit's Progress    No complications from Decubitus ulcer of right heel       Care Coordination Interventions: Evaluation of current treatment plan related to Decubitus ulcer of right heel and patient's adherence to plan as established by provider Determined patient experienced a recent hospital admission due to AKI secondary to taking NSAID's to help control pain to right heel  Review of patient status, including review of consultant's reports, relevant laboratory and other test results, and medications completed Determined patient continues to be in moderate to severe pain from the wound Assessed for knowledge and understanding of the prescribed wound care, patient verbalizes understanding and is able to repeat back how to care for this wound Educated patient on signs/symptoms suggestive of infection and when to seek medical attention Stressed to patient the importance to avoid all NSAID's per MD recommendations due to CKD and recent AKI Reviewed scheduled/upcoming provider appointments including: PCP, Podiatry and Cardiology appointments, discussed patient plans to drive himself to these appointments             Our next appointment is by telephone on 06/14/22 at 12 PM  Please call the care guide team at [phone] if you need to cancel or reschedule your appointment.   If you are experiencing a Mental Health or [place] or need someone to talk to, please call [phone]-TALK (toll free, 24 hour hotline) go to [facility] [address], [place] [phone])  Patient verbalizes understanding of instructions and care plan provided today and agrees to view in [facility]. Active MyChart status and patient  understanding of how to access instructions and care plan via MyChart confirmed with patient.     Barb Merino, RN, BSN, CCM Care Management Coordinator [facility] Care Management Direct Phone: [phone]

## 2022-06-06 NOTE — Patient Outreach (Addendum)
  Care Coordination   Follow Up Visit Note   06/06/2022 Name: Caleb [name] MRN: [id number] DOB: 1950/03/30  Caleb [name] is a 72 y.o. year old male who sees [name], MD for primary care. I spoke with  Caleb [name] by phone today.  What matters to the patients health and wellness today?  Patient plans to ask his doctor for a home health nurse to assist with wound care to his right heel.     Goals Addressed             This Visit's Progress    No complications from Decubitus ulcer of right heel       Care Coordination Interventions: Evaluation of current treatment plan related to Decubitus ulcer of right heel and patient's adherence to plan as established by provider Determined patient experienced a recent hospital admission due to AKI secondary to taking NSAID's to help control pain to right heel  Review of patient status, including review of consultant's reports, relevant laboratory and other test results, and medications completed Determined patient continues to be in moderate to severe pain from the wound Assessed for knowledge and understanding of the prescribed wound care, patient verbalizes understanding and is able to repeat back how to care for this wound Educated patient on signs/symptoms suggestive of infection and when to seek medical attention Stressed to patient the importance to avoid all NSAID's per MD recommendations due to CKD and recent AKI Reviewed scheduled/upcoming provider appointments including: PCP, Podiatry and Cardiology appointments, discussed patient plans to drive himself to these appointments   Interventions Today    Flowsheet Row Most Recent Value  Chronic Disease   Chronic disease during today's visit Diabetes, Other, Chronic Kidney Disease/End Stage Renal Disease (ESRD)  [foot wound]  General Interventions   General Interventions Discussed/Reviewed General Interventions Discussed, General Interventions Reviewed, Labs, Durable Medical  Equipment (DME), Doctor Visits  Labs Kidney Function  Doctor Visits Discussed/Reviewed Doctor Visits Discussed, Doctor Visits Reviewed, Specialist, PCP  Durable Medical Equipment (DME) Glucomoter  [orthopedic boot]  Exercise Interventions   Exercise Discussed/Reviewed Physical Activity  Physical Activity Discussed/Reviewed --  Noah Delaine weight bearing]  Education Interventions   Education Provided Provided Education  Provided Verbal Education On Foot Care, When to see the doctor, Medication  Nutrition Interventions   Nutrition Discussed/Reviewed Fluid intake  Pharmacy Interventions   Pharmacy Dicussed/Reviewed Medications and their functions, Pharmacy Topics Discussed  Safety Interventions   Safety Discussed/Reviewed Fall Risk, Safety Discussed, Safety Reviewed          SDOH assessments and interventions completed:  No     Care Coordination Interventions:  Yes, provided   Follow up plan: Follow up call scheduled for 06/14/22 @12  PM    Encounter Outcome:  Pt. Visit Completed

## 2022-06-07 ENCOUNTER — Inpatient Hospital Stay (HOSPITAL_COMMUNITY)
Admission: EM | Admit: 2022-06-07 | Discharge: 2022-06-30 | DRG: 853 | Disposition: A | Discharge status: Skilled Nursing Facility | Payer: Medicare HMO | Attending: Family Medicine | Admitting: Family Medicine

## 2022-06-07 ENCOUNTER — Encounter: Payer: Self-pay | Admitting: Family Medicine

## 2022-06-07 ENCOUNTER — Telehealth: Payer: No Typology Code available for payment source | Admitting: Family Medicine

## 2022-06-07 ENCOUNTER — Other Ambulatory Visit: Payer: Self-pay

## 2022-06-07 ENCOUNTER — Inpatient Hospital Stay (HOSPITAL_COMMUNITY): Payer: Medicare HMO

## 2022-06-07 ENCOUNTER — Ambulatory Visit: Payer: Medicare HMO | Admitting: Podiatry

## 2022-06-07 ENCOUNTER — Ambulatory Visit: Payer: Self-pay

## 2022-06-07 ENCOUNTER — Ambulatory Visit: Payer: No Typology Code available for payment source | Admitting: Cardiovascular Disease

## 2022-06-07 ENCOUNTER — Encounter: Payer: Self-pay | Admitting: Podiatry

## 2022-06-07 ENCOUNTER — Emergency Department (HOSPITAL_COMMUNITY): Payer: Medicare HMO

## 2022-06-07 ENCOUNTER — Encounter: Payer: Self-pay | Admitting: Cardiovascular Disease

## 2022-06-07 DIAGNOSIS — I130 Hypertensive heart and chronic kidney disease with heart failure and stage 1 through stage 4 chronic kidney disease, or unspecified chronic kidney disease: Secondary | ICD-10-CM | POA: Diagnosis not present

## 2022-06-07 DIAGNOSIS — S91301A Unspecified open wound, right foot, initial encounter: Secondary | ICD-10-CM | POA: Diagnosis not present

## 2022-06-07 DIAGNOSIS — E7800 Pure hypercholesterolemia, unspecified: Secondary | ICD-10-CM | POA: Diagnosis present

## 2022-06-07 DIAGNOSIS — R6510 Systemic inflammatory response syndrome (SIRS) of non-infectious origin without acute organ dysfunction: Secondary | ICD-10-CM | POA: Insufficient documentation

## 2022-06-07 DIAGNOSIS — E119 Type 2 diabetes mellitus without complications: Secondary | ICD-10-CM | POA: Diagnosis not present

## 2022-06-07 DIAGNOSIS — I10 Essential (primary) hypertension: Secondary | ICD-10-CM | POA: Diagnosis present

## 2022-06-07 DIAGNOSIS — Z8249 Family history of ischemic heart disease and other diseases of the circulatory system: Secondary | ICD-10-CM

## 2022-06-07 DIAGNOSIS — E871 Hypo-osmolality and hyponatremia: Secondary | ICD-10-CM | POA: Diagnosis present

## 2022-06-07 DIAGNOSIS — R531 Weakness: Secondary | ICD-10-CM | POA: Diagnosis not present

## 2022-06-07 DIAGNOSIS — E1142 Type 2 diabetes mellitus with diabetic polyneuropathy: Secondary | ICD-10-CM | POA: Diagnosis present

## 2022-06-07 DIAGNOSIS — K521 Toxic gastroenteritis and colitis: Secondary | ICD-10-CM | POA: Diagnosis not present

## 2022-06-07 DIAGNOSIS — R1110 Vomiting, unspecified: Secondary | ICD-10-CM | POA: Diagnosis not present

## 2022-06-07 DIAGNOSIS — L89619 Pressure ulcer of right heel, unspecified stage: Secondary | ICD-10-CM | POA: Diagnosis present

## 2022-06-07 DIAGNOSIS — E1152 Type 2 diabetes mellitus with diabetic peripheral angiopathy with gangrene: Secondary | ICD-10-CM | POA: Diagnosis not present

## 2022-06-07 DIAGNOSIS — M869 Osteomyelitis, unspecified: Secondary | ICD-10-CM | POA: Diagnosis not present

## 2022-06-07 DIAGNOSIS — N179 Acute kidney failure, unspecified: Principal | ICD-10-CM | POA: Diagnosis present

## 2022-06-07 DIAGNOSIS — I739 Peripheral vascular disease, unspecified: Secondary | ICD-10-CM | POA: Diagnosis present

## 2022-06-07 DIAGNOSIS — M86171 Other acute osteomyelitis, right ankle and foot: Secondary | ICD-10-CM | POA: Diagnosis not present

## 2022-06-07 DIAGNOSIS — R2681 Unsteadiness on feet: Secondary | ICD-10-CM | POA: Diagnosis not present

## 2022-06-07 DIAGNOSIS — E860 Dehydration: Secondary | ICD-10-CM | POA: Diagnosis present

## 2022-06-07 DIAGNOSIS — I70221 Atherosclerosis of native arteries of extremities with rest pain, right leg: Secondary | ICD-10-CM | POA: Diagnosis not present

## 2022-06-07 DIAGNOSIS — E1165 Type 2 diabetes mellitus with hyperglycemia: Secondary | ICD-10-CM | POA: Diagnosis not present

## 2022-06-07 DIAGNOSIS — Z794 Long term (current) use of insulin: Secondary | ICD-10-CM

## 2022-06-07 DIAGNOSIS — E875 Hyperkalemia: Secondary | ICD-10-CM | POA: Diagnosis present

## 2022-06-07 DIAGNOSIS — E785 Hyperlipidemia, unspecified: Secondary | ICD-10-CM | POA: Diagnosis not present

## 2022-06-07 DIAGNOSIS — I272 Pulmonary hypertension, unspecified: Secondary | ICD-10-CM | POA: Diagnosis not present

## 2022-06-07 DIAGNOSIS — N1832 Chronic kidney disease, stage 3b: Secondary | ICD-10-CM | POA: Diagnosis not present

## 2022-06-07 DIAGNOSIS — R197 Diarrhea, unspecified: Secondary | ICD-10-CM | POA: Diagnosis not present

## 2022-06-07 DIAGNOSIS — E11621 Type 2 diabetes mellitus with foot ulcer: Secondary | ICD-10-CM

## 2022-06-07 DIAGNOSIS — E872 Acidosis, unspecified: Secondary | ICD-10-CM | POA: Diagnosis present

## 2022-06-07 DIAGNOSIS — Z7401 Bed confinement status: Secondary | ICD-10-CM | POA: Diagnosis not present

## 2022-06-07 DIAGNOSIS — R2689 Other abnormalities of gait and mobility: Secondary | ICD-10-CM | POA: Diagnosis not present

## 2022-06-07 DIAGNOSIS — I110 Hypertensive heart disease with heart failure: Secondary | ICD-10-CM | POA: Diagnosis not present

## 2022-06-07 DIAGNOSIS — E1169 Type 2 diabetes mellitus with other specified complication: Secondary | ICD-10-CM | POA: Diagnosis present

## 2022-06-07 DIAGNOSIS — L089 Local infection of the skin and subcutaneous tissue, unspecified: Secondary | ICD-10-CM | POA: Diagnosis not present

## 2022-06-07 DIAGNOSIS — L97411 Non-pressure chronic ulcer of right heel and midfoot limited to breakdown of skin: Secondary | ICD-10-CM | POA: Diagnosis present

## 2022-06-07 DIAGNOSIS — M6281 Muscle weakness (generalized): Secondary | ICD-10-CM | POA: Diagnosis not present

## 2022-06-07 DIAGNOSIS — E1122 Type 2 diabetes mellitus with diabetic chronic kidney disease: Secondary | ICD-10-CM | POA: Diagnosis present

## 2022-06-07 DIAGNOSIS — R739 Hyperglycemia, unspecified: Secondary | ICD-10-CM | POA: Diagnosis not present

## 2022-06-07 DIAGNOSIS — S99929A Unspecified injury of unspecified foot, initial encounter: Secondary | ICD-10-CM | POA: Diagnosis not present

## 2022-06-07 DIAGNOSIS — D649 Anemia, unspecified: Secondary | ICD-10-CM | POA: Diagnosis not present

## 2022-06-07 DIAGNOSIS — I70261 Atherosclerosis of native arteries of extremities with gangrene, right leg: Secondary | ICD-10-CM | POA: Diagnosis present

## 2022-06-07 DIAGNOSIS — I5043 Acute on chronic combined systolic (congestive) and diastolic (congestive) heart failure: Secondary | ICD-10-CM | POA: Diagnosis not present

## 2022-06-07 DIAGNOSIS — Z4789 Encounter for other orthopedic aftercare: Secondary | ICD-10-CM | POA: Diagnosis not present

## 2022-06-07 DIAGNOSIS — Z01810 Encounter for preprocedural cardiovascular examination: Secondary | ICD-10-CM | POA: Diagnosis not present

## 2022-06-07 DIAGNOSIS — T360X5A Adverse effect of penicillins, initial encounter: Secondary | ICD-10-CM | POA: Diagnosis not present

## 2022-06-07 DIAGNOSIS — L97509 Non-pressure chronic ulcer of other part of unspecified foot with unspecified severity: Secondary | ICD-10-CM | POA: Diagnosis not present

## 2022-06-07 DIAGNOSIS — Z833 Family history of diabetes mellitus: Secondary | ICD-10-CM

## 2022-06-07 DIAGNOSIS — R9431 Abnormal electrocardiogram [ECG] [EKG]: Secondary | ICD-10-CM | POA: Diagnosis not present

## 2022-06-07 DIAGNOSIS — R110 Nausea: Secondary | ICD-10-CM | POA: Diagnosis not present

## 2022-06-07 DIAGNOSIS — I509 Heart failure, unspecified: Secondary | ICD-10-CM | POA: Diagnosis not present

## 2022-06-07 DIAGNOSIS — I5020 Unspecified systolic (congestive) heart failure: Secondary | ICD-10-CM

## 2022-06-07 DIAGNOSIS — R600 Localized edema: Secondary | ICD-10-CM | POA: Diagnosis not present

## 2022-06-07 DIAGNOSIS — K219 Gastro-esophageal reflux disease without esophagitis: Secondary | ICD-10-CM | POA: Diagnosis present

## 2022-06-07 DIAGNOSIS — Z952 Presence of prosthetic heart valve: Secondary | ICD-10-CM

## 2022-06-07 DIAGNOSIS — Z885 Allergy status to narcotic agent status: Secondary | ICD-10-CM

## 2022-06-07 DIAGNOSIS — R6520 Severe sepsis without septic shock: Secondary | ICD-10-CM | POA: Diagnosis not present

## 2022-06-07 DIAGNOSIS — E11628 Type 2 diabetes mellitus with other skin complications: Secondary | ICD-10-CM | POA: Diagnosis present

## 2022-06-07 DIAGNOSIS — Z79899 Other long term (current) drug therapy: Secondary | ICD-10-CM

## 2022-06-07 DIAGNOSIS — E861 Hypovolemia: Secondary | ICD-10-CM | POA: Diagnosis present

## 2022-06-07 DIAGNOSIS — L97519 Non-pressure chronic ulcer of other part of right foot with unspecified severity: Secondary | ICD-10-CM | POA: Diagnosis not present

## 2022-06-07 DIAGNOSIS — T8130XA Disruption of wound, unspecified, initial encounter: Secondary | ICD-10-CM | POA: Diagnosis not present

## 2022-06-07 DIAGNOSIS — A419 Sepsis, unspecified organism: Secondary | ICD-10-CM | POA: Diagnosis not present

## 2022-06-07 DIAGNOSIS — R0602 Shortness of breath: Secondary | ICD-10-CM | POA: Diagnosis not present

## 2022-06-07 DIAGNOSIS — R41841 Cognitive communication deficit: Secondary | ICD-10-CM | POA: Diagnosis not present

## 2022-06-07 DIAGNOSIS — I428 Other cardiomyopathies: Secondary | ICD-10-CM | POA: Diagnosis not present

## 2022-06-07 DIAGNOSIS — Z811 Family history of alcohol abuse and dependence: Secondary | ICD-10-CM

## 2022-06-07 DIAGNOSIS — L97418 Non-pressure chronic ulcer of right heel and midfoot with other specified severity: Secondary | ICD-10-CM | POA: Diagnosis not present

## 2022-06-07 DIAGNOSIS — Z7982 Long term (current) use of aspirin: Secondary | ICD-10-CM

## 2022-06-07 DIAGNOSIS — I70234 Atherosclerosis of native arteries of right leg with ulceration of heel and midfoot: Secondary | ICD-10-CM | POA: Diagnosis not present

## 2022-06-07 DIAGNOSIS — L97419 Non-pressure chronic ulcer of right heel and midfoot with unspecified severity: Secondary | ICD-10-CM | POA: Diagnosis not present

## 2022-06-07 DIAGNOSIS — E1151 Type 2 diabetes mellitus with diabetic peripheral angiopathy without gangrene: Secondary | ICD-10-CM | POA: Diagnosis not present

## 2022-06-07 DIAGNOSIS — Z7902 Long term (current) use of antithrombotics/antiplatelets: Secondary | ICD-10-CM

## 2022-06-07 DIAGNOSIS — Z83719 Family history of colon polyps, unspecified: Secondary | ICD-10-CM

## 2022-06-07 DIAGNOSIS — Z7901 Long term (current) use of anticoagulants: Secondary | ICD-10-CM

## 2022-06-07 DIAGNOSIS — K529 Noninfective gastroenteritis and colitis, unspecified: Secondary | ICD-10-CM

## 2022-06-07 LAB — BASIC METABOLIC PANEL
Anion gap: 14 (ref 5–15)
BUN: 75 mg/dL — ABNORMAL HIGH (ref 8–23)
CO2: 20 mmol/L — ABNORMAL LOW (ref 22–32)
Calcium: 8.7 mg/dL — ABNORMAL LOW (ref 8.9–10.3)
Chloride: 94 mmol/L — ABNORMAL LOW (ref 98–111)
Creatinine, Ser: 2.97 mg/dL — ABNORMAL HIGH (ref 0.61–1.24)
GFR, Estimated: 22 mL/min — ABNORMAL LOW (ref >60)
Glucose, Bld: 270 mg/dL — ABNORMAL HIGH (ref 70–99)
Potassium: 5.5 mmol/L — ABNORMAL HIGH (ref 3.5–5.1)
Sodium: 128 mmol/L — ABNORMAL LOW (ref 135–145)

## 2022-06-07 LAB — LACTIC ACID, PLASMA
Lactic Acid, Venous: 2.1 mmol/L (ref 0.5–1.9)
Lactic Acid, Venous: 2.5 mmol/L (ref 0.5–1.9)
Lactic Acid, Venous: 3 mmol/L (ref 0.5–1.9)

## 2022-06-07 LAB — GLUCOSE, CAPILLARY
Glucose-Capillary: 308 mg/dL — ABNORMAL HIGH (ref 70–99)
Glucose-Capillary: 285 mg/dL — ABNORMAL HIGH (ref 70–99)

## 2022-06-07 LAB — CBC
HCT: 41.6 % (ref 39.0–52.0)
Hemoglobin: 13.7 g/dL (ref 13.0–17.0)
MCH: 26.8 pg (ref 26.0–34.0)
MCHC: 32.9 g/dL (ref 30.0–36.0)
MCV: 81.3 fL (ref 80.0–100.0)
Platelets: 267 10*3/uL (ref 150–400)
RBC: 5.12 MIL/uL (ref 4.22–5.81)
RDW: 15.4 % (ref 11.5–15.5)
WBC: 13.9 10*3/uL — ABNORMAL HIGH (ref 4.0–10.5)
nRBC: 0 % (ref 0.0–0.2)

## 2022-06-07 MED ORDER — INSULIN ASPART 100 UNIT/ML IJ SOLN
0.0000 [IU] | Freq: Three times a day (TID) | INTRAMUSCULAR | Status: DC
  Administered 2022-06-07: 11 [IU] via SUBCUTANEOUS
  Administered 2022-06-08 (×2): 8 [IU] via SUBCUTANEOUS
  Administered 2022-06-09 (×2): 5 [IU] via SUBCUTANEOUS
  Administered 2022-06-10: 3 [IU] via SUBCUTANEOUS
  Administered 2022-06-10: 2 [IU] via SUBCUTANEOUS
  Administered 2022-06-10: 3 [IU] via SUBCUTANEOUS
  Administered 2022-06-11: 2 [IU] via SUBCUTANEOUS
  Administered 2022-06-11 – 2022-06-12 (×4): 3 [IU] via SUBCUTANEOUS
  Administered 2022-06-12 – 2022-06-13 (×2): 2 [IU] via SUBCUTANEOUS
  Administered 2022-06-13 – 2022-06-16 (×6): 3 [IU] via SUBCUTANEOUS
  Administered 2022-06-16 – 2022-06-17 (×4): 2 [IU] via SUBCUTANEOUS
  Administered 2022-06-18 (×2): 3 [IU] via SUBCUTANEOUS
  Administered 2022-06-19 (×2): 2 [IU] via SUBCUTANEOUS
  Administered 2022-06-20 (×2): 3 [IU] via SUBCUTANEOUS
  Administered 2022-06-21 – 2022-06-22 (×3): 2 [IU] via SUBCUTANEOUS
  Administered 2022-06-22: 3 [IU] via SUBCUTANEOUS
  Administered 2022-06-23 – 2022-06-25 (×5): 2 [IU] via SUBCUTANEOUS
  Administered 2022-06-25: 3 [IU] via SUBCUTANEOUS
  Administered 2022-06-26 – 2022-06-28 (×4): 2 [IU] via SUBCUTANEOUS
  Administered 2022-06-30 (×2): 3 [IU] via SUBCUTANEOUS
  Filled 2022-06-07: qty 0.15

## 2022-06-07 MED ORDER — HEPARIN SODIUM (PORCINE) 5000 UNIT/ML IJ SOLN
5000.0000 [IU] | Freq: Three times a day (TID) | INTRAMUSCULAR | Status: DC
  Administered 2022-06-07 – 2022-06-17 (×28): 5000 [IU] via SUBCUTANEOUS
  Filled 2022-06-07 (×27): qty 1

## 2022-06-07 MED ORDER — HYDROMORPHONE HCL 1 MG/ML IJ SOLN
0.5000 mg | Freq: Once | INTRAMUSCULAR | Status: AC
  Administered 2022-06-07: 0.5 mg via INTRAVENOUS
  Filled 2022-06-07: qty 1

## 2022-06-07 MED ORDER — SODIUM CHLORIDE 0.9 % IV SOLN
2.0000 g | Freq: Every day | INTRAVENOUS | Status: DC
  Administered 2022-06-07: 2 g via INTRAVENOUS
  Filled 2022-06-07: qty 12.5

## 2022-06-07 MED ORDER — HYDROCODONE-ACETAMINOPHEN 5-325 MG PO TABS
1.0000 | ORAL_TABLET | Freq: Four times a day (QID) | ORAL | Status: DC | PRN
  Administered 2022-06-07 – 2022-06-09 (×5): 1 via ORAL
  Filled 2022-06-07 (×5): qty 1

## 2022-06-07 MED ORDER — LORAZEPAM 1 MG PO TABS
1.0000 mg | ORAL_TABLET | ORAL | Status: DC | PRN
  Administered 2022-06-09 – 2022-06-11 (×5): 1 mg via ORAL
  Filled 2022-06-07 (×6): qty 1

## 2022-06-07 MED ORDER — PROCHLORPERAZINE EDISYLATE 10 MG/2ML IJ SOLN: 5.0000 mg | Freq: Once | INTRAMUSCULAR | Status: DC | PRN

## 2022-06-07 MED ORDER — CLOPIDOGREL BISULFATE 75 MG PO TABS
75.0000 mg | ORAL_TABLET | Freq: Every day | ORAL | Status: DC
  Administered 2022-06-07 – 2022-06-15 (×9): 75 mg via ORAL
  Filled 2022-06-07 (×9): qty 1

## 2022-06-07 MED ORDER — FERROUS SULFATE 325 (65 FE) MG PO TABS
325.0000 mg | ORAL_TABLET | Freq: Every day | ORAL | Status: DC
  Administered 2022-06-08 – 2022-06-30 (×22): 325 mg via ORAL
  Filled 2022-06-07 (×22): qty 1

## 2022-06-07 MED ORDER — SODIUM CHLORIDE 0.9 % IV SOLN: INTRAVENOUS | Status: AC

## 2022-06-07 MED ORDER — INSULIN ASPART PROT & ASPART (70-30 MIX) 100 UNIT/ML ~~LOC~~ SUSP
5.0000 [IU] | Freq: Two times a day (BID) | SUBCUTANEOUS | Status: DC
  Administered 2022-06-07 – 2022-06-08 (×2): 5 [IU] via SUBCUTANEOUS
  Filled 2022-06-07: qty 10

## 2022-06-07 MED ORDER — INSULIN ASPART 100 UNIT/ML IJ SOLN
0.0000 [IU] | Freq: Every day | INTRAMUSCULAR | Status: DC
  Administered 2022-06-07: 3 [IU] via SUBCUTANEOUS
  Administered 2022-06-13: 2 [IU] via SUBCUTANEOUS
  Administered 2022-06-24: 3 [IU] via SUBCUTANEOUS
  Administered 2022-06-26 – 2022-06-29 (×2): 2 [IU] via SUBCUTANEOUS
  Filled 2022-06-07: qty 0.05

## 2022-06-07 MED ORDER — LINEZOLID 600 MG/300ML IV SOLN
600.0000 mg | Freq: Two times a day (BID) | INTRAVENOUS | Status: DC
  Administered 2022-06-07 (×2): 600 mg via INTRAVENOUS
  Filled 2022-06-07 (×3): qty 300

## 2022-06-07 MED ORDER — ATORVASTATIN CALCIUM 40 MG PO TABS
40.0000 mg | ORAL_TABLET | Freq: Every day | ORAL | Status: DC
  Administered 2022-06-07 – 2022-06-30 (×23): 40 mg via ORAL
  Filled 2022-06-07 (×24): qty 1

## 2022-06-07 MED ORDER — CARVEDILOL 12.5 MG PO TABS
12.5000 mg | ORAL_TABLET | Freq: Two times a day (BID) | ORAL | Status: DC
  Administered 2022-06-07 – 2022-06-30 (×46): 12.5 mg via ORAL
  Filled 2022-06-07 (×46): qty 1

## 2022-06-07 MED ORDER — DOCUSATE SODIUM 100 MG PO CAPS
100.0000 mg | ORAL_CAPSULE | Freq: Two times a day (BID) | ORAL | Status: DC
  Administered 2022-06-07 – 2022-06-18 (×16): 100 mg via ORAL
  Filled 2022-06-07 (×22): qty 1

## 2022-06-07 MED ORDER — ASPIRIN 81 MG PO CHEW
81.0000 mg | CHEWABLE_TABLET | Freq: Every day | ORAL | Status: DC
  Administered 2022-06-08 – 2022-06-30 (×22): 81 mg via ORAL
  Filled 2022-06-07 (×22): qty 1

## 2022-06-07 MED ORDER — SODIUM ZIRCONIUM CYCLOSILICATE 10 G PO PACK
10.0000 g | PACK | Freq: Every day | ORAL | Status: DC
  Administered 2022-06-07: 10 g via ORAL
  Filled 2022-06-07 (×2): qty 1

## 2022-06-07 MED ORDER — SODIUM CHLORIDE 0.9 % IV BOLUS
500.0000 mL | Freq: Once | INTRAVENOUS | Status: AC
  Administered 2022-06-07: 500 mL via INTRAVENOUS

## 2022-06-07 MED ORDER — LACTATED RINGERS IV BOLUS
500.0000 mL | Freq: Once | INTRAVENOUS | Status: AC
  Administered 2022-06-07: 500 mL via INTRAVENOUS

## 2022-06-07 MED ORDER — ONDANSETRON HCL 4 MG PO TABS
4.0000 mg | ORAL_TABLET | Freq: Four times a day (QID) | ORAL | Status: DC | PRN
  Administered 2022-06-14 (×2): 4 mg via ORAL
  Filled 2022-06-07 (×2): qty 1

## 2022-06-07 MED ORDER — ONDANSETRON HCL 4 MG/2ML IJ SOLN
4.0000 mg | Freq: Four times a day (QID) | INTRAMUSCULAR | Status: DC | PRN
  Administered 2022-06-07 – 2022-06-24 (×6): 4 mg via INTRAVENOUS
  Filled 2022-06-07 (×6): qty 2

## 2022-06-07 MED ORDER — HYDRALAZINE HCL 25 MG PO TABS
25.0000 mg | ORAL_TABLET | Freq: Three times a day (TID) | ORAL | Status: DC
  Administered 2022-06-07 – 2022-06-28 (×56): 25 mg via ORAL
  Filled 2022-06-07 (×61): qty 1

## 2022-06-07 MED ORDER — IPRATROPIUM-ALBUTEROL 0.5-2.5 (3) MG/3ML IN SOLN
3.0000 mL | Freq: Four times a day (QID) | RESPIRATORY_TRACT | Status: DC | PRN
  Administered 2022-06-07: 3 mL via RESPIRATORY_TRACT
  Filled 2022-06-07: qty 3

## 2022-06-07 MED ORDER — POLYETHYLENE GLYCOL 3350 17 G PO PACK: 17.0000 g | PACK | Freq: Every day | ORAL | Status: DC | PRN

## 2022-06-07 MED ORDER — TRAZODONE HCL 50 MG PO TABS
25.0000 mg | ORAL_TABLET | Freq: Every evening | ORAL | Status: DC | PRN
  Administered 2022-06-08 – 2022-06-29 (×7): 25 mg via ORAL
  Filled 2022-06-07 (×8): qty 1

## 2022-06-07 NOTE — Patient Instructions (Signed)
Visit Information  Thank you for taking time to visit with me today. Please don't hesitate to contact me if I can be of assistance to you.   Following are the goals we discussed today:   Goals Addressed             This Visit's Progress    No complications from Decubitus ulcer of right heel       Care Coordination Interventions: Inbound call from patient  Evaluation of current treatment plan related to Decubitus ulcer of right heel and patient's adherence to plan as established by provider Discussed patient is having severe pain to his right heel, rates pain 10/10 Discussed patient feels his pain is beyond the point of managing outpatient, he requested this RN to contact his PCP to advise he is going to [facility] ED, he will utilize EMS to transport him to this appointment  Placed outbound call to Dr. Smitty Pluck office, advised operator of patient's severe pain and plan to go to [facility] via EMS, she will cancel his upcoming virtual visit with Dr. Margarita Rana and send her a message         Our next appointment is by telephone on 06/14/22 at 12 PM  Please call the care guide team at [phone] if you need to cancel or reschedule your appointment.   If you are experiencing a Mental Health or [place] or need someone to talk to, please call 1-800-273-TALK (toll free, 24 hour hotline) go to [facility] [address], [place] [phone])  Patient verbalizes understanding of instructions and care plan provided today and agrees to view in [place]. Active MyChart status and patient understanding of how to access instructions and care plan via MyChart confirmed with patient.     Barb Merino, RN, BSN, CCM Care Management Coordinator [facility] Care Management Direct Phone: [phone]

## 2022-06-07 NOTE — ED Notes (Signed)
Pharmacy at bedside

## 2022-06-07 NOTE — ED Notes (Signed)
After completing EKG, noted pt began to throw up.

## 2022-06-07 NOTE — ED Notes (Signed)
ED TO INPATIENT HANDOFF REPORT  ED Nurse Name and Phone #: Ronalee Belts [phone]  S Name/Age/Gender Caleb [name] 72 y.o. male Room/Bed: WA11/WA11  Code Status   Code Status: Full Code  Home/SNF/Other Home Patient oriented to: self, place, time, and situation Is this baseline? Yes   Triage Complete: Triage complete  Chief Complaint SIRS (systemic inflammatory response syndrome) (HCC) [R65.10]  Triage Note EMS reports from home, called out for increasing right heel pain from ulcer  x 1 month. States daughter is trying to set Pt up with wound care. Pt states he has diabetic peripheral neuropathy.  BP 158/110 RR 18 HR 106 Sp02 99 RA CBG 293     Allergies Allergies  Allergen Reactions   Codeine Nausea And Vomiting   Percocet [Oxycodone-Acetaminophen] Nausea And Vomiting   Tramadol Hcl Nausea And Vomiting    Level of Care/Admitting Diagnosis ED Disposition     ED Disposition  Admit   Condition  --   Comment  Hospital Area: [place] [[id number]]  Level of Care: Telemetry [5]  Admit to tele based on following criteria: Complex arrhythmia (Bradycardia/Tachycardia)  May admit patient to [facility] if equivalent level of care is available:: Yes  Covid Evaluation: Asymptomatic - no recent exposure (last 10 days) testing not required  Diagnosis: SIRS (systemic inflammatory response syndrome) [facility]) [id number]  Admitting Physician: Lucillie Garfinkel [id number]  Attending Physician: [place], MIR Kari.[place] [id number]  Certification:: I certify this patient will need inpatient services for at least 2 midnights  Estimated Length of Stay: 3          B Medical/Surgery History Past Medical History:  Diagnosis Date   Carotid artery occlusion    Diabetes mellitus without complication ([place])    Type II   Dyslipidemia 09/27/2012   Erectile dysfunction 09/27/2012   GERD (gastroesophageal reflux disease)    Hypercholesteremia    Hyperlipidemia  08/12/2012   Hypertension    Hyponatremia 08/14/2012   Neuropathy    Pancreatitis    Pancreatitis, acute 09/27/2012   Peripheral vascular disease ([place])    S/P 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 Burt Knack & Bartle   Severe aortic stenosis    Vitamin D deficiency 06/23/2015   Past Surgical History:  Procedure Laterality Date   ABDOMINAL AORTOGRAM W/LOWER EXTREMITY N/A 08/08/2019   Procedure: ABDOMINAL AORTOGRAM W/LOWER EXTREMITY;  Surgeon: Lorretta Harp, MD;  Location: [place] [facility];  Service: Cardiovascular;  Laterality: N/A;   ABDOMINAL AORTOGRAM W/LOWER EXTREMITY N/A 11/18/2019   Procedure: ABDOMINAL AORTOGRAM W/LOWER EXTREMITY;  Surgeon: Lorretta Harp, MD;  Location: [place] [facility];  Service: Cardiovascular;  Laterality: N/A;   CARDIAC CATHETERIZATION     COLONOSCOPY  12 years ago   in [place] clinic= normal exam per pt   INTRAOPERATIVE TRANSTHORACIC ECHOCARDIOGRAM N/A 01/11/2022   Procedure: INTRAOPERATIVE TRANSTHORACIC ECHOCARDIOGRAM;  Surgeon: Sherren Mocha, MD;  Location: [place] [facility];  Service: Open Heart Surgery;  Laterality: N/A;   KNEE SURGERY     MULTIPLE EXTRACTIONS WITH ALVEOLOPLASTY N/A 12/09/2021   Procedure: MULTIPLE EXTRACTION;  Surgeon: Charlaine Dalton, DMD;  Location: [place];  Service: Dentistry;  Laterality: N/A;   PERIPHERAL VASCULAR INTERVENTION Right 08/08/2019   Procedure: PERIPHERAL VASCULAR INTERVENTION;  Surgeon: Lorretta Harp, MD;  Location: [place] [facility];  Service: Cardiovascular;  Laterality: Right;   PERIPHERAL VASCULAR INTERVENTION Left  11/18/2019   Procedure: PERIPHERAL VASCULAR INTERVENTION;  Surgeon: Lorretta Harp, MD;  Location: [place] [facility];  Service: Cardiovascular;  Laterality: Left;  left popliteal artery   RIGHT/LEFT HEART CATH AND CORONARY ANGIOGRAPHY N/A 10/29/2021   Procedure: RIGHT/LEFT HEART CATH AND CORONARY  ANGIOGRAPHY;  Surgeon: Burnell Blanks, MD;  Location: [place] [facility];  Service: Cardiovascular;  Laterality: N/A;   TRANSCAROTID ARTERY REVASCULARIZATION  Left 12/16/2019   Procedure: TRANSCAROTID ARTERY REVASCULARIZATION;  Surgeon: Elam Dutch, MD;  Location: [place];  Service: Vascular;  Laterality: Left;   TRANSCAROTID ARTERY REVASCULARIZATION  Right 02/03/2020   Procedure: RIGHT TRANSCAROTID ARTERY REVASCULARIZATION;  Surgeon: Elam Dutch, MD;  Location: [place];  Service: Vascular;  Laterality: Right;   TRANSCATHETER AORTIC VALVE REPLACEMENT, TRANSFEMORAL N/A 01/11/2022   Procedure: Transcatheter Aortic Valve Replacement, Transfemoral;  Surgeon: Sherren Mocha, MD;  Location: [place] [facility];  Service: Open Heart Surgery;  Laterality: N/A;   ULTRASOUND GUIDANCE FOR VASCULAR ACCESS Right 12/16/2019   Procedure: ULTRASOUND GUIDANCE FOR VASCULAR ACCESS;  Surgeon: Elam Dutch, MD;  Location: [place];  Service: Vascular;  Laterality: Right;   ULTRASOUND GUIDANCE FOR VASCULAR ACCESS Right 02/03/2020   Procedure: ULTRASOUND GUIDANCE FOR VASCULAR ACCESS;  Surgeon: Elam Dutch, MD;  Location[facility];  Service: Vascular;  Laterality: Right;   VASECTOMY       A IV Location/Drains/Wounds Patient Lines/Drains/Airways Status     Active Line/Drains/Airways     Name Placement date Placement time Site Days   Peripheral IV 06/07/22 20 G Right Antecubital 06/07/22  1216  Antecubital  less than 1   Wound / Incision (Open or Dehisced) 05/27/22 Diabetic ulcer Heel Right 05/27/22  2104  Heel  11            Intake/Output Last 24 hours No intake or output data in the 24 hours ending 06/07/22 1358  Labs/Imaging Results for orders placed or performed during the hospital encounter of 06/07/22 (from the past 48 hour(s))  CBC     Status: Abnormal   Collection Time: 06/07/22 12:18 PM  Result Value Ref Range   WBC 13.9 (H) 4.0 - 10.5 K/uL   RBC 5.12 4.22 - 5.81 MIL/uL    Hemoglobin 13.7 13.0 - 17.0 g/dL   HCT 41.6 39.0 - 52.0 %   MCV 81.3 80.0 - 100.0 fL   MCH 26.8 26.0 - 34.0 pg   MCHC 32.9 30.0 - 36.0 g/dL   RDW 15.4 11.5 - 15.5 %   Platelets 267 150 - 400 K/uL   nRBC 0.0 0.0 - 0.2 %    Comment: Performed at [facility], [place] [address] [place], [place] [id number]  Basic metabolic panel     Status: Abnormal   Collection Time: 06/07/22 12:18 PM  Result Value Ref Range   Sodium 128 (L) 135 - 145 mmol/L   Potassium 5.5 (H) 3.5 - 5.1 mmol/L   Chloride 94 (L) 98 - 111 mmol/L   CO2 20 (L) 22 - 32 mmol/L   Glucose, Bld 270 (H) 70 - 99 mg/dL    Comment: Glucose reference range applies only to samples taken after fasting for at least 8 hours.   BUN 75 (H) 8 - 23 mg/dL   Creatinine, Ser 2.97 (H) 0.61 - 1.24 mg/dL   Calcium 8.7 (L) 8.9 - 10.3 mg/dL   GFR, Estimated 22 (L) >60 mL/min    Comment: (NOTE) Calculated using the CKD-EPI Creatinine Equation (2021)  Anion gap 14 5 - 15    Comment: Performed at [facility], [place] [address], [place], [place] [address]  Lactic acid, plasma     Status: Abnormal   Collection Time: 06/07/22 12:18 PM  Result Value Ref Range   Lactic Acid, Venous 2.1 (HH) 0.5 - 1.9 mmol/L    Comment: CRITICAL RESULT CALLED TO, READ BACK BY AND VERIFIED WITH Anshi Jalloh, J.RN AT 1257 ON 06/07/2022 BY MECIAL J. Performed at [facility], [place] [address] [place], [place] [address]    DG Foot Complete Right  Result Date: 06/07/2022 CLINICAL DATA:  Diabetic foot ulcer EXAM: RIGHT FOOT COMPLETE - 3+ VIEW COMPARISON:  05/12/2022 FINDINGS: Deep soft tissue ulceration at the posterior aspect of the right heel. No erosion or periosteal elevation is evident of the underlying posterior calcaneus. No acute fracture or dislocation. Small plantar calcaneal spur. Similar degree of mild degenerative changes. Atherosclerotic vascular calcifications. No abnormally tracking soft tissue gas is seen.  IMPRESSION: Deep soft tissue ulceration at the posterior aspect of the right heel. No radiographic evidence of osteomyelitis. Electronically Signed   By: Davina Poke D.O.   On: 06/07/2022 11:58    Pending Labs Unresulted Labs (From admission, onward)     Start     Ordered   06/08/22 0500  CBC  Daily,   R      06/07/22 1343   06/08/22 [id number]  Basic metabolic panel  Daily,   R      06/07/22 1343   06/07/22 1348  CBC  (heparin)  Once,   R       Comments: Baseline for heparin therapy IF NOT ALREADY DRAWN.  Notify MD if PLT < 100 K.    06/07/22 1351   06/07/22 1348  Creatinine, serum  (heparin)  Once,   R       Comments: Baseline for heparin therapy IF NOT ALREADY DRAWN.    06/07/22 1351   06/07/22 1141  Lactic acid, plasma  Now then every 2 hours,   R (with STAT occurrences)      06/07/22 1140            Vitals/Pain Today's Vitals   06/07/22 1119 06/07/22 1200 06/07/22 1230 06/07/22 1300  BP:  (!) 153/112 (!) 142/112 (!) 145/97  Pulse:  (!) 101 99 92  Resp:  18  16  Temp:      TempSrc:      SpO2:  98% (!) 69% 94%  PainSc: 10-Worst pain ever       Isolation Precautions No active isolations  Medications Medications  sodium zirconium cyclosilicate (LOKELMA) packet 10 g (10 g Oral Given 06/07/22 1324)  aspirin EC tablet 81 mg (has no administration in time range)  HYDROcodone-acetaminophen (NORCO/VICODIN) 5-325 MG per tablet 1 tablet (has no administration in time range)  atorvastatin (LIPITOR) tablet 40 mg (has no administration in time range)  carvedilol (COREG) tablet 12.5 mg (has no administration in time range)  hydrALAZINE (APRESOLINE) tablet 25 mg (has no administration in time range)  clopidogrel (PLAVIX) tablet 75 mg (has no administration in time range)  ferrous sulfate tablet 325 mg (has no administration in time range)  heparin injection 5,000 Units (has no administration in time range)  insulin aspart (novoLOG) injection 0-15 Units (has no administration in  time range)  insulin aspart (novoLOG) injection 0-5 Units (has no administration in time range)  insulin aspart protamine- aspart (NOVOLOG MIX 70/30) injection 5 Units (has no administration  in time range)  0.9 %  sodium chloride infusion (has no administration in time range)  traZODone (DESYREL) tablet 25 mg (has no administration in time range)  docusate sodium (COLACE) capsule 100 mg (has no administration in time range)  polyethylene glycol (MIRALAX / GLYCOLAX) packet 17 g (has no administration in time range)  ondansetron (ZOFRAN) tablet 4 mg (has no administration in time range)    Or  ondansetron (ZOFRAN) injection 4 mg (has no administration in time range)  HYDROmorphone (DILAUDID) injection 0.5 mg (0.5 mg Intravenous Given 06/07/22 1217)  sodium chloride 0.9 % bolus 500 mL (500 mLs Intravenous New Bag/Given 06/07/22 1323)  sodium chloride 0.9 % bolus 500 mL (500 mLs Intravenous New Bag/Given 06/07/22 1356)    Mobility non-ambulatory     Focused Assessments .   R Recommendations: See Admitting Provider Note  Report given to:   Additional Notes: .

## 2022-06-07 NOTE — Progress Notes (Signed)
PHARMACY NOTE -  Cefepime  Pharmacy has been assisting with dosing of Cefepime for infected R heel decubitus ulcer. Dosage remains stable at 2g IV q24 hr and further renal adjustments per institutional Pharmacy antibiotic protocol Foot XR w/o evidence of osteomyelitis; MRI pending Discussed empiric coverage w/ MD; did not feel anaerobic coverage warranted at this time based on overall wound appearance and clinical history  Pharmacy will sign off, following peripherally for culture results, dose adjustments, and length of therapy. Please reconsult if a change in clinical status warrants re-evaluation of dosage.  Reuel Boom, PharmD, BCPS [phone] 06/07/2022, 2:16 PM

## 2022-06-07 NOTE — ED Provider Notes (Signed)
[facility] Provider Note   CSN: [id number] Arrival date & time: 06/07/22  1111     History  Chief Complaint  Patient presents with   Wound Check   Foot Pain    JACKSYN TRAINER is a 72 y.o. male with medical history of chronic systolic CHF, ejection fraction 35 to 40%, diabetes type 2, GERD, hypertension, diabetic neuropathy, aortic stenosis, TAVR 12/2021, CKD stage III, diabetic foot ulcer on right heel.  Patient presents to ED for evaluation of diabetic right heel ulcer.  Patient reports that the heel ulcer appeared 1 month ago.  The patient states that he was hospitalized on 3/8 and discharged on 3/13.  The patient reports that since being discharged he has had progressive pain to his right heel associated with foul-smelling drainage.  Patient reports that he lives at home alone, his cousin has been coming out to help with wound dressing changes.  Patient reports that he is attempted to get home health set up, wound care however has been not been able to get in touch with appropriate resources regarding this.  Patient states that he has been taking ibuprofen, 600 mg 3 times daily despite his stage III chronic kidney disease.  Patient reports that his daughter, who is a pediatrician in [place], has stated that she does not like the appearance of his right heel ulcer.  Patient denies any fevers, nausea, vomiting, body aches or chills, chest pain or shortness of breath.  Patient denies any lower extremity swelling.   Wound Check  Foot Pain       Home Medications Prior to Admission medications   Medication Sig Start Date End Date Taking? Authorizing Provider  aspirin EC 81 MG tablet Take 81 mg by mouth in the morning.    [provider]  atorvastatin (LIPITOR) 40 MG tablet Take 40 mg by mouth in the morning. 07/03/20   [provider]  carvedilol (COREG) 12.5 MG tablet Take 1 tablet (12.5 mg total) by mouth 2 (two) times  daily with a meal. 10/30/21 12/28/23  Arrien, Jimmy Picket, MD  clopidogrel (PLAVIX) 75 MG tablet Take 1 tablet (75 mg total) by mouth daily. 05/13/22   Broadus John, MD  Empagliflozin-metFORMIN HCl ER (SYNJARDY XR) 25-1000 MG TB24 Take 1 tablet by mouth in the morning.    [provider]  ferrous sulfate 325 (65 FE) MG tablet Take 325 mg by mouth in the morning.    [provider]  furosemide (LASIX) 80 MG tablet Take 1 tablet (80 mg total) by mouth daily. 06/01/22   Bonnielee Haff, MD  gabapentin (NEURONTIN) 800 MG tablet Take 800 mg by mouth at bedtime as needed (Sleep). 07/03/20   [provider]  hydrALAZINE (APRESOLINE) 25 MG tablet TAKE 1 TABLET BY MOUTH THREE TIMES A DAY 06/07/22   Lorretta Harp, MD  HYDROcodone-acetaminophen (NORCO/VICODIN) 5-325 MG tablet Take 1 tablet by mouth every 6 (six) hours as needed for moderate pain. 06/01/22 06/01/23  Bonnielee Haff, MD  insulin NPH-regular Human (70-30) 100 UNIT/ML injection Inject 8 Units into the skin daily with breakfast. 7 units with dinner    [provider]  isosorbide mononitrate (IMDUR) 30 MG 24 hr tablet Take 1 tablet (30 mg total) by mouth daily. 01/13/22 01/13/23  Eileen Stanford, PA-C  polyethylene glycol (MIRALAX / GLYCOLAX) 17 g packet Take 17 g by mouth daily. 06/02/22   Bonnielee Haff, MD  PRESCRIPTION MEDICATION Inject 0.1-1 mLs  as directed See admin instructions. Tri-Mix Standard Strength 5 ml. Formula: Prostaglandin 10 mcg/ml, Papaverine 30 mg/ml, Phentolamine 1 mg/ml. Inject 0.1 ml into side of penis as directed as needed (to be injected immediately before sexual intercourse) may increase the dose by 0.1 ml every 48 hours to achieve an erection. Max dose 1 ml.    [provider]  sacubitril-valsartan (ENTRESTO) 49-51 MG Take 1 tablet by mouth 2 (two) times daily. 03/07/22   Sabharwal, Aditya, DO  vitamin B-12 (CYANOCOBALAMIN) 500 MCG tablet Take 500 mcg by mouth in the  morning. 08/24/21   [provider]      Allergies    Codeine, Percocet [oxycodone-acetaminophen], and Tramadol hcl    Review of Systems   Review of Systems  Skin:  Positive for wound.  All other systems reviewed and are negative.   Physical Exam Updated Vital Signs BP (!) 145/97   Pulse 92   Temp 97.9 F (36.6 C) (Oral)   Resp 16   SpO2 94%  Physical Exam Vitals and nursing note reviewed.  Constitutional:      General: He is not in acute distress.    Appearance: He is diaphoretic. He is not ill-appearing or toxic-appearing.  HENT:     Head: Normocephalic and atraumatic.     Nose: Nose normal.     Mouth/Throat:     Mouth: Mucous membranes are moist.     Pharynx: Oropharynx is clear.  Eyes:     Extraocular Movements: Extraocular movements intact.     Conjunctiva/sclera: Conjunctivae normal.     Pupils: Pupils are equal, round, and reactive to light.  Cardiovascular:     Rate and Rhythm: Regular rhythm. Tachycardia present.  Pulmonary:     Effort: Pulmonary effort is normal.     Breath sounds: Normal breath sounds. No wheezing.  Abdominal:     General: Abdomen is flat.     Tenderness: There is no abdominal tenderness.  Skin:    Capillary Refill: Capillary refill takes less than 2 seconds.     Comments: 4 x 3 ulcer to heel patient right foot with foul smell, brown drainage  Neurological:     Mental Status: He is alert and oriented to person, place, and time.       ED Results / Procedures / Treatments   Labs (all labs ordered are listed, but only abnormal results are displayed) Labs Reviewed  CBC - Abnormal; Notable for the following components:      Result Value   WBC 13.9 (*)    All other components within normal limits  BASIC METABOLIC PANEL - Abnormal; Notable for the following components:   Sodium 128 (*)    Potassium 5.5 (*)    Chloride 94 (*)    CO2 20 (*)    Glucose, Bld 270 (*)    BUN 75 (*)    Creatinine, Ser 2.97 (*)    Calcium 8.7  (*)    GFR, Estimated 22 (*)    All other components within normal limits  LACTIC ACID, PLASMA - Abnormal; Notable for the following components:   Lactic Acid, Venous 2.1 (*)    All other components within normal limits  LACTIC ACID, PLASMA  CBC  CREATININE, SERUM    EKG None  Radiology DG Foot Complete Right  Result Date: 06/07/2022 CLINICAL DATA:  Diabetic foot ulcer EXAM: RIGHT FOOT COMPLETE - 3+ VIEW COMPARISON:  05/12/2022 FINDINGS: Deep soft tissue ulceration at the posterior aspect of the right heel.  No erosion or periosteal elevation is evident of the underlying posterior calcaneus. No acute fracture or dislocation. Small plantar calcaneal spur. Similar degree of mild degenerative changes. Atherosclerotic vascular calcifications. No abnormally tracking soft tissue gas is seen. IMPRESSION: Deep soft tissue ulceration at the posterior aspect of the right heel. No radiographic evidence of osteomyelitis. Electronically Signed   By: Davina Poke D.O.   On: 06/07/2022 11:58    Procedures .Critical Care  Performed by: Azucena Cecil, PA-C Authorized by: Azucena Cecil, PA-C   Critical care provider statement:    Critical care was necessary to treat or prevent imminent or life-threatening deterioration of the following conditions:  Sepsis   Critical care was time spent personally by me on the following activities:  Blood draw for specimens, development of treatment plan with patient or surrogate, discussions with consultants, discussions with primary provider, evaluation of patient's response to treatment, examination of patient, interpretation of cardiac output measurements, obtaining history from patient or surrogate, review of old charts, re-evaluation of patient's condition, pulse oximetry, ordering and review of radiographic studies, ordering and review of laboratory studies and ordering and performing treatments and interventions   I assumed direction of critical care  for this patient from another provider in my specialty: no     Care discussed with: admitting provider      Medications Ordered in ED Medications  sodium zirconium cyclosilicate (LOKELMA) packet 10 g (10 g Oral Given 06/07/22 1324)  aspirin EC tablet 81 mg (has no administration in time range)  HYDROcodone-acetaminophen (NORCO/VICODIN) 5-325 MG per tablet 1 tablet (has no administration in time range)  atorvastatin (LIPITOR) tablet 40 mg (has no administration in time range)  carvedilol (COREG) tablet 12.5 mg (has no administration in time range)  hydrALAZINE (APRESOLINE) tablet 25 mg (has no administration in time range)  clopidogrel (PLAVIX) tablet 75 mg (has no administration in time range)  ferrous sulfate tablet 325 mg (has no administration in time range)  heparin injection 5,000 Units (has no administration in time range)  insulin aspart (novoLOG) injection 0-15 Units (has no administration in time range)  insulin aspart (novoLOG) injection 0-5 Units (has no administration in time range)  insulin aspart protamine- aspart (NOVOLOG MIX 70/30) injection 5 Units (has no administration in time range)  0.9 %  sodium chloride infusion (has no administration in time range)  traZODone (DESYREL) tablet 25 mg (has no administration in time range)  docusate sodium (COLACE) capsule 100 mg (has no administration in time range)  polyethylene glycol (MIRALAX / GLYCOLAX) packet 17 g (has no administration in time range)  ondansetron (ZOFRAN) tablet 4 mg (has no administration in time range)    Or  ondansetron (ZOFRAN) injection 4 mg (has no administration in time range)  linezolid (ZYVOX) IVPB 600 mg (has no administration in time range)  ceFEPIme (MAXIPIME) 2 g in sodium chloride 0.9 % 100 mL IVPB (has no administration in time range)  HYDROmorphone (DILAUDID) injection 0.5 mg (0.5 mg Intravenous Given 06/07/22 1217)  sodium chloride 0.9 % bolus 500 mL (500 mLs Intravenous New Bag/Given 06/07/22  1323)  sodium chloride 0.9 % bolus 500 mL (500 mLs Intravenous New Bag/Given 06/07/22 1356)    ED Course/ Medical Decision Making/ A&P  Medical Decision Making Amount and/or Complexity of Data Reviewed Labs: ordered. Radiology: ordered.  Risk Prescription drug management.   72 year old male presents to ED for evaluation.  Please see HPI for further details.  On examination the patient is afebrile.  His  lung sounds are clear bilaterally, he is not hypoxic.  Abdomen is soft compressible throughout.  Neuroexam at baseline.  Patient does have 4 x 3 ulcer to right heel with foul-smelling drainage, brown drainage.  Will proceed patient workup with CBC, BMP, lactic, x-ray of right foot.  Will initially provide patient 0.5 mg Dilaudid for pain control.  Patient CBC with leukocytosis to 13.9, no anemia.  BMP with decreased sodium to 128, elevated potassium to 5.5, elevated glucose of 270, elevated creatinine at 2.97.  Patient creatinine 1 week ago was 1.7.  This acute rise is indicative of AKI.  This is most likely secondary to the patient's NSAID use over the last 1 week.  Patient lactic acid elevated at 2.1.  X-ray of patient right foot does not show any evidence of obvious osteomyelitis however we will proceed with MRI right foot without contrast to assess.  Due to patient AKI will begin patient on 500 mL of fluid due to patient CHF status, reduced ejection fraction.  Patient does meet SIRS criteria so we will begin him on broad-spectrum antibiotics cefepime and, vancomycin.  Will give patient 10 g packet of Lokelma for hyperkalemia, will collect EKG to assess for peaked T waves.  On reassessment patient reports that pain is reduced at this time.  Dr. Dwaine Gale, triad hospitalist, has been consulted for admission.  Dr. Dwaine Gale has agreed to admit the patient.  Patient stable at time of admission.   Final Clinical Impression(s) / ED Diagnoses Final diagnoses:  AKI (acute kidney injury) ([place])  Diabetic  ulcer of right heel associated with type 2 diabetes mellitus, with other ulcer severity ([place])  Sepsis, due to unspecified organism, unspecified whether acute organ dysfunction present [facility])    Rx / DC Orders ED Discharge Orders     None         Azucena Cecil, PA-C 06/07/22 1422    Charlesetta Shanks, MD 06/08/22 1000

## 2022-06-07 NOTE — ED Triage Notes (Signed)
EMS reports from home, called out for increasing right heel pain from ulcer  x 1 month. States daughter is trying to set Pt up with wound care. Pt states he has diabetic peripheral neuropathy.  BP 158/110 RR 18 HR 106 Sp02 99 RA CBG 293

## 2022-06-07 NOTE — Patient Outreach (Signed)
  Care Coordination   Follow Up Visit Note   06/07/2022 Name: Caleb [name] MRN: [id number] DOB: 1951/01/05  Caleb [name] is a 72 y.o. year old male who sees [name], MD for primary care. I spoke with  Caleb [name] by phone today.  What matters to the patients health and wellness today?  Patient will call EMS to transport him to [facility] ED for evaluation of severe right heel pain.     Goals Addressed             This Visit's Progress    No complications from Decubitus ulcer of right heel       Care Coordination Interventions: Inbound call from patient  Evaluation of current treatment plan related to Decubitus ulcer of right heel and patient's adherence to plan as established by provider Discussed patient is having severe pain to his right heel, rates pain 10/10 Discussed patient feels his pain is beyond the point of managing outpatient, he requested this RN to contact his PCP to advise he is going to [facility] ED, he will utilize EMS to transport him to this appointment  Placed outbound call to Dr. Smitty Pluck office, advised operator of patient's severe pain and plan to go to [facility] via EMS, she will cancel his upcoming virtual visit with Dr. Margarita Rana and send her a message     Interventions Today    Flowsheet Row Most Recent Value  Chronic Disease   Chronic disease during today's visit Other  [right heel pain]  General Interventions   General Interventions Discussed/Reviewed General Interventions Reviewed, General Interventions Discussed  Doctor Visits Discussed/Reviewed Doctor Visits Discussed, Doctor Visits Reviewed, PCP, Specialist  Communication with PCP/Specialists  [Dr. Margarita Rana PCP]  Education Interventions   Education Provided Provided Education  Provided Verbal Education On When to see the doctor  Safety Interventions   Safety Discussed/Reviewed Safety Discussed          SDOH assessments and interventions completed:  No     Care  Coordination Interventions:  Yes, provided   Follow up plan: Follow up call scheduled for 06/14/22 @12  PM    Encounter Outcome:  Pt. Visit Completed

## 2022-06-07 NOTE — H&P (Addendum)
History and Physical  MICA KOWALICK [id number] DOB: [birth year] DOA: 06/07/2022  PCP: [name], MD   Chief Complaint: Foot pain  HPI: Caleb [name] is a 72 y.o. male with medical history significant for Insulin-dependent type 2 diabetes, GERD, hypertension, hypercholesterolemia, hyponatremia as well as TAVR and chronic systolic CHF with ejection fraction 35 to 40% who is admitted to the hospital with acute kidney injury, in the setting of evaluation of diabetic right heel ulcer.  He said that ulcer has been there for about the last month, he was discharged from this hospital recently on 3/13, when he was admitted here for acute kidney injury and acute on chronic systolic and diastolic heart failure.  He says that since his discharge from the hospital he was doing okay, but has been having some brown drainage from the right heel.  He sent pictures of this to his podiatrist Dr. Posey Pronto, who was not very concerned.  The patient has been taking his home medications including gabapentin, as well as over-the-counter ibuprofen for the pain.  He came to the emergency department because he was supposed have an appointment with Dr. Posey Pronto today but could not stand the pain, was worried that he would actually have worsening pain after his right heel was debrided.  ED Course: Here in the emergency department, vital signs are stable, except for some mild hypertension 145/97.  Lab work was pursued, which shows white blood cell count 13,000, hemoglobin and platelets are stable, he also has hyponatremia 128 potassium 5.5, his creatinine is up to 2.97, where it was 1.7 on the day he was discharged from the hospital on 3/13.  As such, the patient was started on some gentle IV fluids.  His lactate was also 2.1.  ER provider contacted hospitalist for admission, he will be admitted to her service.  On my recommendation patient has been started on cefepime.  Review of Systems: Please see HPI for pertinent positives  and negatives. A complete 10 system review of systems are otherwise negative.  Past Medical History:  Diagnosis Date   Carotid artery occlusion    Diabetes mellitus without complication ([place])    Type II   Dyslipidemia 09/27/2012   Erectile dysfunction 09/27/2012   GERD (gastroesophageal reflux disease)    Hypercholesteremia    Hyperlipidemia 08/12/2012   Hypertension    Hyponatremia 08/14/2012   Neuropathy    Pancreatitis    Pancreatitis, acute 09/27/2012   Peripheral vascular disease ([place])    S/P 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 Burt Knack & Bartle   Severe aortic stenosis    Vitamin D deficiency 06/23/2015   Past Surgical History:  Procedure Laterality Date   ABDOMINAL AORTOGRAM W/LOWER EXTREMITY N/A 08/08/2019   Procedure: ABDOMINAL AORTOGRAM W/LOWER EXTREMITY;  Surgeon: Lorretta Harp, MD;  Location: [place] [facility];  Service: Cardiovascular;  Laterality: N/A;   ABDOMINAL AORTOGRAM W/LOWER EXTREMITY N/A 11/18/2019   Procedure: ABDOMINAL AORTOGRAM W/LOWER EXTREMITY;  Surgeon: Lorretta Harp, MD;  Location: [place] [facility];  Service: Cardiovascular;  Laterality: N/A;   CARDIAC CATHETERIZATION     COLONOSCOPY  12 years ago   in [place] clinic= normal exam per pt   INTRAOPERATIVE TRANSTHORACIC ECHOCARDIOGRAM N/A 01/11/2022   Procedure: INTRAOPERATIVE TRANSTHORACIC ECHOCARDIOGRAM;  Surgeon: Sherren Mocha, MD;  Location: [place] [facility];  Service: Open Heart Surgery;  Laterality: N/A;   KNEE SURGERY  MULTIPLE EXTRACTIONS WITH ALVEOLOPLASTY N/A 12/09/2021   Procedure: MULTIPLE EXTRACTION;  Surgeon: Charlaine Dalton, DMD;  Location: [place];  Service: Dentistry;  Laterality: N/A;   PERIPHERAL VASCULAR INTERVENTION Right 08/08/2019   Procedure: PERIPHERAL VASCULAR INTERVENTION;  Surgeon: Lorretta Harp, MD;  Location: [place] [facility];  Service: Cardiovascular;  Laterality: Right;    PERIPHERAL VASCULAR INTERVENTION Left 11/18/2019   Procedure: PERIPHERAL VASCULAR INTERVENTION;  Surgeon: Lorretta Harp, MD;  Location: [place] [facility];  Service: Cardiovascular;  Laterality: Left;  left popliteal artery   RIGHT/LEFT HEART CATH AND CORONARY ANGIOGRAPHY N/A 10/29/2021   Procedure: RIGHT/LEFT HEART CATH AND CORONARY ANGIOGRAPHY;  Surgeon: Burnell Blanks, MD;  Location: [place] [facility];  Service: Cardiovascular;  Laterality: N/A;   TRANSCAROTID ARTERY REVASCULARIZATION  Left 12/16/2019   Procedure: TRANSCAROTID ARTERY REVASCULARIZATION;  Surgeon: Elam Dutch, MD;  Location: [place];  Service: Vascular;  Laterality: Left;   TRANSCAROTID ARTERY REVASCULARIZATION  Right 02/03/2020   Procedure: RIGHT TRANSCAROTID ARTERY REVASCULARIZATION;  Surgeon: Elam Dutch, MD;  Location: [facility];  Service: Vascular;  Laterality: Right;   TRANSCATHETER AORTIC VALVE REPLACEMENT, TRANSFEMORAL N/A 01/11/2022   Procedure: Transcatheter Aortic Valve Replacement, Transfemoral;  Surgeon: Sherren Mocha, MD;  Location: [place] [facility];  Service: Open Heart Surgery;  Laterality: N/A;   ULTRASOUND GUIDANCE FOR VASCULAR ACCESS Right 12/16/2019   Procedure: ULTRASOUND GUIDANCE FOR VASCULAR ACCESS;  Surgeon: Elam Dutch, MD;  Location: [place];  Service: Vascular;  Laterality: Right;   ULTRASOUND GUIDANCE FOR VASCULAR ACCESS Right 02/03/2020   Procedure: ULTRASOUND GUIDANCE FOR VASCULAR ACCESS;  Surgeon: Elam Dutch, MD;  Location: [place];  Service: Vascular;  Laterality: Right;   VASECTOMY      Social History:  reports that he has never smoked. He has never used smokeless tobacco. He reports current alcohol use. He reports that he does not use drugs.   Allergies  Allergen Reactions   Codeine Nausea And Vomiting   Percocet [Oxycodone-Acetaminophen] Nausea And Vomiting   Tramadol Hcl Nausea And Vomiting    Family History  Problem Relation Age of Onset   CAD  Father    Hypertension Father    Alcohol abuse Father        Cause of death   Diabetes Mother    Colon polyps Mother    CAD Brother 50       CABG   Colon cancer Neg Hx    Esophageal cancer Neg Hx    Rectal cancer Neg Hx    Stomach cancer Neg Hx      Prior to Admission medications   Medication Sig Start Date End Date Taking? Authorizing Provider  aspirin EC 81 MG tablet Take 81 mg by mouth in the morning.    [provider]  atorvastatin (LIPITOR) 40 MG tablet Take 40 mg by mouth in the morning. 07/03/20   [provider]  carvedilol (COREG) 12.5 MG tablet Take 1 tablet (12.5 mg total) by mouth 2 (two) times daily with a meal. 10/30/21 12/28/23  Arrien, Jimmy Picket, MD  clopidogrel (PLAVIX) 75 MG tablet Take 1 tablet (75 mg total) by mouth daily. 05/13/22   Broadus John, MD  Empagliflozin-metFORMIN HCl ER (SYNJARDY XR) 25-1000 MG TB24 Take 1 tablet by mouth in the morning.    [provider]  ferrous sulfate 325 (65 FE) MG tablet Take 325 mg by mouth in the morning.    [provider]  furosemide (  LASIX) 80 MG tablet Take 1 tablet (80 mg total) by mouth daily. 06/01/22   Bonnielee Haff, MD  gabapentin (NEURONTIN) 800 MG tablet Take 800 mg by mouth at bedtime as needed (Sleep). 07/03/20   [provider]  hydrALAZINE (APRESOLINE) 25 MG tablet TAKE 1 TABLET BY MOUTH THREE TIMES A DAY 06/07/22   Lorretta Harp, MD  HYDROcodone-acetaminophen (NORCO/VICODIN) 5-325 MG tablet Take 1 tablet by mouth every 6 (six) hours as needed for moderate pain. 06/01/22 06/01/23  Bonnielee Haff, MD  insulin NPH-regular Human (70-30) 100 UNIT/ML injection Inject 8 Units into the skin daily with breakfast. 7 units with dinner    [provider]  isosorbide mononitrate (IMDUR) 30 MG 24 hr tablet Take 1 tablet (30 mg total) by mouth daily. 01/13/22 01/13/23  Eileen Stanford, PA-C  polyethylene glycol (MIRALAX / GLYCOLAX) 17 g packet Take 17 g by mouth  daily. 06/02/22   Bonnielee Haff, MD  PRESCRIPTION MEDICATION Inject 0.1-1 mLs as directed See admin instructions. Tri-Mix Standard Strength 5 ml. Formula: Prostaglandin 10 mcg/ml, Papaverine 30 mg/ml, Phentolamine 1 mg/ml. Inject 0.1 ml into side of penis as directed as needed (to be injected immediately before sexual intercourse) may increase the dose by 0.1 ml every 48 hours to achieve an erection. Max dose 1 ml.    [provider]  sacubitril-valsartan (ENTRESTO) 49-51 MG Take 1 tablet by mouth 2 (two) times daily. 03/07/22   Sabharwal, Aditya, DO  vitamin B-12 (CYANOCOBALAMIN) 500 MCG tablet Take 500 mcg by mouth in the morning. 08/24/21   [provider]    Physical Exam: BP (!) 145/97   Pulse 92   Temp 97.9 F (36.6 C) (Oral)   Resp 16   SpO2 94%   General:  Alert, oriented, calm, in no acute distress, does not look toxic somewhat anxious when discussing heel pain Eyes: EOMI, clear conjuctivae, white sclerea Neck: supple, no masses, trachea mildline  Cardiovascular: RRR, no murmurs or rubs, no peripheral edema  Respiratory: clear to auscultation bilaterally, no wheezes, no crackles  Abdomen: soft, nontender, nondistended, normal bowel tones heard  Skin: dry, no rashes there is a 2 cm round wound on the heel of the right foot, with no drainage seen, tenderness to palpation, no surrounding erythema, does not look actively infected Musculoskeletal: no joint effusions, normal range of motion  Psychiatric: appropriate affect, normal speech  Neurologic: extraocular muscles intact, clear speech, moving all extremities with intact sensorium          Labs on Admission:  Basic Metabolic Panel: Recent Labs  Lab 06/01/22 0726 06/07/22 1218  NA 133* 128*  K 4.9 5.5*  CL 92* 94*  CO2 31 20*  GLUCOSE 207* 270*  BUN 60* 75*  CREATININE 1.70* 2.97*  CALCIUM 9.0 8.7*   Liver Function Tests: No results for input(s): "AST", "ALT", "ALKPHOS", "BILITOT", "PROT", "ALBUMIN"  in the last 168 hours. No results for input(s): "LIPASE", "AMYLASE" in the last 168 hours. No results for input(s): "AMMONIA" in the last 168 hours. CBC: Recent Labs  Lab 06/07/22 1218  WBC 13.9*  HGB 13.7  HCT 41.6  MCV 81.3  PLT 267   Cardiac Enzymes: No results for input(s): "CKTOTAL", "CKMB", "CKMBINDEX", "TROPONINI" in the last 168 hours.  BNP (last 3 results) Recent Labs    10/27/21 1707 03/07/22 1145 05/27/22 1513  BNP 1,165.9* 728.7* >4,500.0*    ProBNP (last 3 results) No results for input(s): "PROBNP" in the last 8760 hours.  CBG:  Recent Labs  Lab 05/31/22 1626 05/31/22 2105 06/01/22 0738  GLUCAP 128* 202* 198*    Radiological Exams on Admission: DG Foot Complete Right  Result Date: 06/07/2022 CLINICAL DATA:  Diabetic foot ulcer EXAM: RIGHT FOOT COMPLETE - 3+ VIEW COMPARISON:  05/12/2022 FINDINGS: Deep soft tissue ulceration at the posterior aspect of the right heel. No erosion or periosteal elevation is evident of the underlying posterior calcaneus. No acute fracture or dislocation. Small plantar calcaneal spur. Similar degree of mild degenerative changes. Atherosclerotic vascular calcifications. No abnormally tracking soft tissue gas is seen. IMPRESSION: Deep soft tissue ulceration at the posterior aspect of the right heel. No radiographic evidence of osteomyelitis. Electronically Signed   By: Davina Poke D.O.   On: 06/07/2022 11:58    Assessment/Plan Principal Problem: Sepsis that she is meeting criteria with tachycardia, leukocytosis, lactate up to 2.5.  I am not totally convinced that his foot is the source but he does seem to have an infection somewhere and possibly in the foot.  MRI of the foot is pending to rule out osteomyelitis. -Initially we were cautious about fluid due to his CHF, but with lactate rising will give another bolus of 500 cc x 1 now.  AKI in the setting of CKD -Unclear etiology of his overall presentation, he clearly has acute  kidney injury, from NSAID use in the setting of CKD, possibly exacerbated by dehydration and he has an active infection. -He is certainly meeting SIRS criteria with tachycardia, leukocytosis, lactate 2.1, though I am not convinced that his he was a source -I think he has a lot of pain in his heel from being debrided in the past, and peripheral neuropathy from his diabetes -Clinically patient appears dry, so we will give him a total 1 L fluid slow bolus, and then resume maintenance fluids gently after that -In the meantime, we will hold his nephrotoxic medications including ACE inhibitor, and follow his renal function with morning labs Active Problems:   Type 2 diabetes mellitus with hyperlipidemia ([place]) -diabetic diet eating, with reduced dose basal insulin since he is not feeling well, and sliding scale   HTN (hypertension), chronic systolic and diastolic congestive heart failure -continue aspirin, Plavix, Coreg; holding Entresto due to acute renal failure   Hyperlipidemia   Hyponatremia   Dyslipidemia   PAD (peripheral artery disease) (HCC)   S/P TAVR (transcatheter aortic valve replacement)   Decubitus ulcer of right heel  DVT prophylaxis: Lovenox   Code Status:    Code Status: Full Code  Consults called: None  Admission status: The appropriate patient status for this patient is INPATIENT. Inpatient status is judged to be reasonable and necessary in order to provide the required intensity of service to ensure the patient's safety. The patient's presenting symptoms, physical exam findings, and initial radiographic and laboratory data in the context of their chronic comorbidities is felt to place them at high risk for further clinical deterioration. Furthermore, it is not anticipated that the patient will be medically stable for discharge from the hospital within 2 midnights of admission.    I certify that at the point of admission it is my clinical judgment that the patient will require  inpatient hospital care spanning beyond 2 midnights from the point of admission due to high intensity of service, high risk for further deterioration and high frequency of surveillance required   Time spent: 48 minutes  Kalaya Infantino Neva Seat MD [facility] Pager [phone]  If 7PM-7AM, please contact night-coverage [email or web address] Password [email or web address]  06/07/2022, 1:51 PM

## 2022-06-08 ENCOUNTER — Ambulatory Visit: Payer: No Typology Code available for payment source | Admitting: Cardiovascular Disease

## 2022-06-08 DIAGNOSIS — I10 Essential (primary) hypertension: Secondary | ICD-10-CM

## 2022-06-08 DIAGNOSIS — I739 Peripheral vascular disease, unspecified: Secondary | ICD-10-CM

## 2022-06-08 DIAGNOSIS — L89619 Pressure ulcer of right heel, unspecified stage: Secondary | ICD-10-CM

## 2022-06-08 DIAGNOSIS — Z952 Presence of prosthetic heart valve: Secondary | ICD-10-CM

## 2022-06-08 DIAGNOSIS — E119 Type 2 diabetes mellitus without complications: Secondary | ICD-10-CM

## 2022-06-08 DIAGNOSIS — R6510 Systemic inflammatory response syndrome (SIRS) of non-infectious origin without acute organ dysfunction: Secondary | ICD-10-CM

## 2022-06-08 DIAGNOSIS — E785 Hyperlipidemia, unspecified: Secondary | ICD-10-CM

## 2022-06-08 DIAGNOSIS — I70234 Atherosclerosis of native arteries of right leg with ulceration of heel and midfoot: Secondary | ICD-10-CM | POA: Diagnosis not present

## 2022-06-08 DIAGNOSIS — I428 Other cardiomyopathies: Secondary | ICD-10-CM | POA: Diagnosis not present

## 2022-06-08 DIAGNOSIS — E871 Hypo-osmolality and hyponatremia: Secondary | ICD-10-CM

## 2022-06-08 DIAGNOSIS — N179 Acute kidney failure, unspecified: Secondary | ICD-10-CM | POA: Diagnosis not present

## 2022-06-08 LAB — BASIC METABOLIC PANEL
Anion gap: 12 (ref 5–15)
Anion gap: 11 (ref 5–15)
BUN: 86 mg/dL — ABNORMAL HIGH (ref 8–23)
BUN: 84 mg/dL — ABNORMAL HIGH (ref 8–23)
CO2: 24 mmol/L (ref 22–32)
CO2: 22 mmol/L (ref 22–32)
Calcium: 8.9 mg/dL (ref 8.9–10.3)
Calcium: 8.7 mg/dL — ABNORMAL LOW (ref 8.9–10.3)
Chloride: 93 mmol/L — ABNORMAL LOW (ref 98–111)
Chloride: 93 mmol/L — ABNORMAL LOW (ref 98–111)
Creatinine, Ser: 3.63 mg/dL — ABNORMAL HIGH (ref 0.61–1.24)
Creatinine, Ser: 3.5 mg/dL — ABNORMAL HIGH (ref 0.61–1.24)
GFR, Estimated: 17 mL/min — ABNORMAL LOW (ref >60)
GFR, Estimated: 18 mL/min — ABNORMAL LOW (ref >60)
Glucose, Bld: 327 mg/dL — ABNORMAL HIGH (ref 70–99)
Glucose, Bld: 293 mg/dL — ABNORMAL HIGH (ref 70–99)
Potassium: 6.6 mmol/L (ref 3.5–5.1)
Potassium: 5.1 mmol/L (ref 3.5–5.1)
Sodium: 129 mmol/L — ABNORMAL LOW (ref 135–145)
Sodium: 126 mmol/L — ABNORMAL LOW (ref 135–145)

## 2022-06-08 LAB — URINALYSIS, ROUTINE W REFLEX MICROSCOPIC
Bacteria, UA: NONE SEEN
Bilirubin Urine: NEGATIVE
Glucose, UA: 50 mg/dL — AB
Hgb urine dipstick: NEGATIVE
Ketones, ur: 5 mg/dL — AB
Leukocytes,Ua: NEGATIVE
Nitrite: NEGATIVE
Protein, ur: 30 mg/dL — AB
Specific Gravity, Urine: 1.017 (ref 1.005–1.030)
pH: 5 (ref 5.0–8.0)

## 2022-06-08 LAB — CBC
HCT: 41.8 % (ref 39.0–52.0)
Hemoglobin: 13.1 g/dL (ref 13.0–17.0)
MCH: 26.3 pg (ref 26.0–34.0)
MCHC: 31.3 g/dL (ref 30.0–36.0)
MCV: 83.9 fL (ref 80.0–100.0)
Platelets: 262 10*3/uL (ref 150–400)
RBC: 4.98 MIL/uL (ref 4.22–5.81)
RDW: 15.6 % — ABNORMAL HIGH (ref 11.5–15.5)
WBC: 14.6 10*3/uL — ABNORMAL HIGH (ref 4.0–10.5)
nRBC: 0 % (ref 0.0–0.2)

## 2022-06-08 LAB — POTASSIUM
Potassium: 4.6 mmol/L (ref 3.5–5.1)
Potassium: 4.3 mmol/L (ref 3.5–5.1)

## 2022-06-08 LAB — GLUCOSE, CAPILLARY
Glucose-Capillary: 288 mg/dL — ABNORMAL HIGH (ref 70–99)
Glucose-Capillary: 297 mg/dL — ABNORMAL HIGH (ref 70–99)
Glucose-Capillary: 133 mg/dL — ABNORMAL HIGH (ref 70–99)
Glucose-Capillary: 194 mg/dL — ABNORMAL HIGH (ref 70–99)

## 2022-06-08 LAB — SODIUM, URINE, RANDOM: Sodium, Ur: 18 mmol/L

## 2022-06-08 LAB — PROCALCITONIN: Procalcitonin: 1 ng/mL

## 2022-06-08 LAB — CREATININE, URINE, RANDOM: Creatinine, Urine: 135 mg/dL

## 2022-06-08 MED ORDER — SODIUM ZIRCONIUM CYCLOSILICATE 10 G PO PACK
10.0000 g | PACK | ORAL | Status: AC
  Administered 2022-06-08: 10 g via ORAL
  Filled 2022-06-08: qty 1

## 2022-06-08 MED ORDER — SODIUM BICARBONATE 8.4 % IV SOLN
50.0000 meq | Freq: Once | INTRAVENOUS | Status: AC
  Administered 2022-06-08: 50 meq via INTRAVENOUS
  Filled 2022-06-08: qty 50

## 2022-06-08 MED ORDER — SODIUM CHLORIDE 0.9 % IV SOLN
8.0000 mg/kg | INTRAVENOUS | Status: DC
  Administered 2022-06-08: 600 mg via INTRAVENOUS
  Filled 2022-06-08: qty 12

## 2022-06-08 MED ORDER — SODIUM CHLORIDE 0.9 % IV SOLN
2.0000 g | INTRAVENOUS | Status: DC
  Administered 2022-06-08: 2 g via INTRAVENOUS
  Filled 2022-06-08: qty 20

## 2022-06-08 MED ORDER — SODIUM CHLORIDE 0.9 % IV SOLN: INTRAVENOUS | Status: DC

## 2022-06-08 MED ORDER — INSULIN ASPART 100 UNIT/ML IV SOLN
5.0000 [IU] | INTRAVENOUS | Status: AC
  Administered 2022-06-08: 5 [IU] via INTRAVENOUS

## 2022-06-08 MED ORDER — ALBUTEROL SULFATE (2.5 MG/3ML) 0.083% IN NEBU
10.0000 mg | INHALATION_SOLUTION | RESPIRATORY_TRACT | Status: AC
  Administered 2022-06-08: 10 mg via RESPIRATORY_TRACT
  Filled 2022-06-08: qty 12

## 2022-06-08 MED ORDER — CALCIUM GLUCONATE-NACL 1-0.675 GM/50ML-% IV SOLN
1.0000 g | INTRAVENOUS | Status: AC
  Administered 2022-06-08: 1000 mg via INTRAVENOUS
  Filled 2022-06-08: qty 50

## 2022-06-08 MED ORDER — INSULIN ASPART PROT & ASPART (70-30 MIX) 100 UNIT/ML ~~LOC~~ SUSP
8.0000 [IU] | Freq: Two times a day (BID) | SUBCUTANEOUS | Status: DC
  Administered 2022-06-08 – 2022-06-09 (×2): 8 [IU] via SUBCUTANEOUS
  Filled 2022-06-08 (×3): qty 10

## 2022-06-08 NOTE — Plan of Care (Signed)
Report called to Helene Kelp, Therapist, sports at [facility]

## 2022-06-08 NOTE — Progress Notes (Signed)
PROGRESS NOTE  Caleb [name]  [id number] DOB: 1950-10-09 DOA: 06/07/2022 PCP: [name], MD   Brief Narrative: Patient is a 72 year old male with history of insulin-dependent diabetes type 2, GERD, hypertension, hyperlipidemia, status post TAVR, chronic systolic CHF with EF of 35 to 40%, CKD stage IIIb who presented to the emergency department for the evaluation of right heel ulcer, right foot pain.  Ulcer appeared about a month ago.  He was admitted here earlier this month and was discharged on 3/13 when he was managed for AKI on CKD.  Since discharge, he developed progressive pain in his right elicited with foul-smelling drainage.  On pension, lab work showed sodium of 128, potassium 5.5, creatinine of 2.9, lactate of 2.1, WBC count of 13.9.  MRI of the right foot showed  acute osteomyelitis of the posterolateral aspect of the calcaneus.  Lab work showed worsening kidney function with creatinine of 3.6, potassium of 6.6 today.  Stat nephrology consult obtained, orthopedics consulted due to finding of osteomyelitis, ID also consulted.  Vascular surgery consulted as per orthopedics recommendation.  Patient being transferred to [facility] as per orthopedics recommendation.  Assessment & Plan:  Principal Problem:   SIRS (systemic inflammatory response syndrome) (HCC) Active Problems:   Type 2 diabetes mellitus with hyperlipidemia (HCC)   HTN (hypertension)   Hyperlipidemia   Hyponatremia   Dyslipidemia   PAD (peripheral artery disease) (HCC)   S/P TAVR (transcatheter aortic valve replacement)   Decubitus ulcer of right heel   Severe sepsis secondary to right heel osteomyelitis: Presented with increased pain, foul-smelling drainage from right heel ulcer.  MRI showing osteomyelitis.  Presented with leukocytosis, lactic acidosis, worsening kidney function. Currently on linezolid and cefepime.  ID consulted.  Blood culture will be sent.  Right foot osteomyelitis/PVD: Presented with increased  pain, drainage from right heel ulcer.  Was followed with podiatry as an outpatient.  [name] consulted here, consulted vascular surgery as per orthopedics recommendation, discussed with Dr. Trula Slade.  Patient being transferred to [facility] for further vascular workup.  ID also following. Has history of peripheral vascular disease, carotid artery stenosis status post bilateral TACR.S/P amputation of fourth and fifth left toes.  Takes aspirin, Plavix, statin at home.  ABI was done on last admission which showed moderate PAD on the right lower extremity arterial disease, mild on the left.  Severe AKI/hyperkalemia: Baseline creatinine ranges from 1.2-1.3.  Presented with AKI on earlier admission.  Creatinine in the range of 2.9 on admission, worsened to 3.6 today.  Worsening potassium of 6.6 today.  Started urgent hyperkalemia protocol: Given Lokelma, calcium gluconate, insulin, albuterol, sodium bicarb.  EKG without changes.  Nephrology consulted.  Continue monitoring potassium level.  Chronic systolic/diastolic CHF: Recent echo showed EF of 25 to 30%, global hypokinesis, grade 2 diastolic dysfunction, global hypokinesis.  Takes Lasix 80 mg daily, currently on hold.  On last admission, outpatient cardiology follow-up was arranged.  He looks euvolemic currently.  Follows with Dr. Gwenlyn Found as an outpatient.  On Coreg  Diabetes type 2: Continue current insulin regimen.  Monitor blood sugars.  Blood sugars running high.  Hyperlipidemia: On Lipitor  Diabetic neuropathy: Takes gabapentin at home.  History of aortic stenosis: Status post TAVR in 10/23.         DVT prophylaxis:heparin injection 5,000 Units Start: 06/07/22 2200 SCDs Start: 06/07/22 1348     Code Status: Full Code  Family Communication: Called and discussed with Dr. Allison Quarry daughter on 3/20  Patient status:Inpatient  Patient is  from :Home  Anticipated discharge AZ:[id number]  Estimated DC date:not sure   Consultants:  Nephrology, ID, orthopedics, vascular surgery  Procedures: None yet  Antimicrobials:  Anti-infectives (From admission, onward)    Start     Dose/Rate Route Frequency Ordered Stop   06/07/22 1415  linezolid (ZYVOX) IVPB 600 mg        600 mg 300 mL/hr over 60 Minutes Intravenous Every 12 hours 06/07/22 1414     06/07/22 1415  ceFEPIme (MAXIPIME) 2 g in sodium chloride 0.9 % 100 mL IVPB        2 g 200 mL/hr over 30 Minutes Intravenous Daily 06/07/22 1414         Subjective: Patient seen and examined at the bedside today.  Comfortable, lying in bed.  Denies any new complaints.  Right foot pain is well-controlled.  Completely alert and oriented.  Discuss about current management plan and the need for transfer to [facility].  Objective: Vitals:   06/07/22 1948 06/07/22 2241 06/07/22 2349 06/08/22 0328  BP: (!) 152/113 (!) 135/103  (!) 137/97  Pulse: 89 82  81  Resp: 20 18  16   Temp: 97.6 F (36.4 C)   97.6 F (36.4 C)  TempSrc:      SpO2: 98% 100% 98% 100%    Intake/Output Summary (Last 24 hours) at 06/08/2022 0810 Last data filed at 06/08/2022 0600 Gross per 24 hour  Intake 1200 ml  Output 300 ml  Net 900 ml   There were no vitals filed for this visit.  Examination:  General exam: Overall comfortable, not in distress HEENT: PERRL Respiratory system:  no wheezes or crackles  Cardiovascular system: S1 & S2 heard, RRR.  Gastrointestinal system: Abdomen is nondistended, soft and nontender. Central nervous system: Alert and oriented Extremities: No edema, no clubbing ,no cyanosis, right heel ulcer, amputation of fourth and fifth digits of left foot Skin: right heel ulcer   Data Reviewed: I have personally reviewed following labs and imaging studies  CBC: Recent Labs  Lab 06/07/22 1218 06/08/22 0541  WBC 13.9* 14.6*  HGB 13.7 13.1  HCT 41.6 41.8  MCV 81.3 83.9  PLT 267 [id number]   Basic Metabolic Panel: Recent Labs  Lab 06/07/22 1218 06/08/22 0541  NA 128* 129*  K  5.5* 6.6*  CL 94* 93*  CO2 20* 24  GLUCOSE 270* 327*  BUN 75* 86*  CREATININE 2.97* 3.63*  CALCIUM 8.7* 8.9     No results found for this or any previous visit (from the past 240 hour(s)).   Radiology Studies: MR FOOT RIGHT WO CONTRAST  Result Date: 06/07/2022 CLINICAL DATA:  Increasing right heel pain from ulcer for 1 month. Diabetic peripheral neuropathy EXAM: MRI OF THE RIGHT FOOT WITHOUT CONTRAST TECHNIQUE: Multiplanar, multisequence MR imaging of the right hindfoot was performed. No intravenous contrast was administered. COMPARISON:  RADIOGRAPHS DATED June 07, 2022 FINDINGS: Bones/Joint/Cartilage There is cortical erosion and marrow edema of the posterolateral aspect of the calcaneus consistent with osteomyelitis. There is heterogeneous signal of the talar dome which is likely sequela of chronic osteochondral injury, likely a chronic process. No evidence of acute edema. Ligaments Ligaments of the lateral and medial compartment of the ankle appear intact. Muscles and Tendons Increased intramuscular signal of the flexor hallucis longus and plantar muscles of the foot suggesting diabetic myopathy/myositis. Soft tissues Deep skin wound with subcutaneous edema about the posterolateral aspect of the heel suggesting chronic diabetic ulcer. No fluid collection or abscess. Mild generalized subcutaneous soft tissue  edema about the ankle and foot. IMPRESSION: 1. Acute osteomyelitis of the posterolateral aspect of the calcaneus. 2. Increased intramuscular signal of the flexor hallucis longus and plantar muscles of the foot suggesting diabetic myopathy/myositis. 3. Deep skin wound with subcutaneous edema about the posterolateral aspect of the heel suggesting chronic diabetic ulcer. No fluid collection or abscess. Electronically Signed   By: Keane Police D.O.   On: 06/07/2022 22:05   DG Foot Complete Right  Result Date: 06/07/2022 CLINICAL DATA:  Diabetic foot ulcer EXAM: RIGHT FOOT COMPLETE - 3+ VIEW  COMPARISON:  05/12/2022 FINDINGS: Deep soft tissue ulceration at the posterior aspect of the right heel. No erosion or periosteal elevation is evident of the underlying posterior calcaneus. No acute fracture or dislocation. Small plantar calcaneal spur. Similar degree of mild degenerative changes. Atherosclerotic vascular calcifications. No abnormally tracking soft tissue gas is seen. IMPRESSION: Deep soft tissue ulceration at the posterior aspect of the right heel. No radiographic evidence of osteomyelitis. Electronically Signed   By: Davina Poke D.O.   On: 06/07/2022 11:58    Scheduled Meds:  albuterol  10 mg Nebulization STAT   aspirin  81 mg Oral Daily   atorvastatin  40 mg Oral Daily   carvedilol  12.5 mg Oral BID WC   clopidogrel  75 mg Oral Daily   docusate sodium  100 mg Oral BID   ferrous sulfate  325 mg Oral Q breakfast   heparin  5,000 Units Subcutaneous Q8H   hydrALAZINE  25 mg Oral TID   insulin aspart  0-15 Units Subcutaneous TID WC   insulin aspart  0-5 Units Subcutaneous QHS   insulin aspart protamine- aspart  5 Units Subcutaneous BID WC   Continuous Infusions:  calcium gluconate 1,000 mg (06/08/22 0756)   ceFEPime (MAXIPIME) IV 2 g (06/07/22 1624)   linezolid (ZYVOX) IV 600 mg (06/07/22 2248)     LOS: 1 day   Shelly Coss, MD [facility] P3/20/2024, 8:10 AM

## 2022-06-08 NOTE — Plan of Care (Signed)
Education provided

## 2022-06-08 NOTE — Consult Note (Signed)
Renal Service Consult Note [facility]  Caleb [name] 06/08/2022 Caleb Blazing, MD Requesting Physician: Dr. Tawanna Solo  Reason for Consult: Renal failure HPI: The patient is a 72 y.o. year-old w/ PMH as below who presented to ED yesterday afternoon due to severe R heel pain / foot ulcer. Has been going on for 1 month. In ED BP 145/97, wbc 13K, Hb ok, K 5.5 w/ repeat 6.6 this am. Creat 2.97 (last creat 1.7 on 3/13). He rec'd 2 L bolus fluids, IV cefepime and zyvox and was admitted. This am K+ was 6.6 and we are asked to see for renal failure/ high K+.   Pt rec'd temporizing measures and repeat K+ was 5.1. The EKG prior to treating was wnl (no QRS widening, no bradycardia). Pt was just here 3/08 to 3/13 w/ creatine 2.7 on admit and 1.70 on discharge. AKI on CKD was felt due to decomp CHF and medications (entresto, nsaids, synjardy) and pt had f/u w/ nephrology at the [place]. Lasix at dc was to be 80mg  qd.   At this time pt states he was having SOB and his lasix was ^'d over the last few days which did help his SOB some. His heel pain is the main c/o, a little better today. No SOB today.    ROS - denies CP, no joint pain, no HA, no blurry vision, no rash, no diarrhea, no nausea/ vomiting, no dysuria, no difficulty voiding   Past Medical History  Past Medical History:  Diagnosis Date   Carotid artery occlusion    Diabetes mellitus without complication ([place])    Type II   Dyslipidemia 09/27/2012   Erectile dysfunction 09/27/2012   GERD (gastroesophageal reflux disease)    Hypercholesteremia    Hyperlipidemia 08/12/2012   Hypertension    Hyponatremia 08/14/2012   Neuropathy    Pancreatitis    Pancreatitis, acute 09/27/2012   Peripheral vascular disease ([place])    S/P 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 Burt Knack & Bartle   Severe aortic stenosis    Vitamin D deficiency 06/23/2015   Past  Surgical History  Past Surgical History:  Procedure Laterality Date   ABDOMINAL AORTOGRAM W/LOWER EXTREMITY N/A 08/08/2019   Procedure: ABDOMINAL AORTOGRAM W/LOWER EXTREMITY;  Surgeon: Lorretta Harp, MD;  Location: [place] [facility];  Service: Cardiovascular;  Laterality: N/A;   ABDOMINAL AORTOGRAM W/LOWER EXTREMITY N/A 11/18/2019   Procedure: ABDOMINAL AORTOGRAM W/LOWER EXTREMITY;  Surgeon: Lorretta Harp, MD;  Location: [place] [facility];  Service: Cardiovascular;  Laterality: N/A;   CARDIAC CATHETERIZATION     COLONOSCOPY  12 years ago   in [place] clinic= normal exam per pt   INTRAOPERATIVE TRANSTHORACIC ECHOCARDIOGRAM N/A 01/11/2022   Procedure: INTRAOPERATIVE TRANSTHORACIC ECHOCARDIOGRAM;  Surgeon: Sherren Mocha, MD;  Location: [place] [facility];  Service: Open Heart Surgery;  Laterality: N/A;   KNEE SURGERY     MULTIPLE EXTRACTIONS WITH ALVEOLOPLASTY N/A 12/09/2021   Procedure: MULTIPLE EXTRACTION;  Surgeon: Charlaine Dalton, DMD;  Location: [place];  Service: Dentistry;  Laterality: N/A;   PERIPHERAL VASCULAR INTERVENTION Right 08/08/2019   Procedure: PERIPHERAL VASCULAR INTERVENTION;  Surgeon: Lorretta Harp, MD;  Location: [place] [facility];  Service: Cardiovascular;  Laterality: Right;   PERIPHERAL VASCULAR INTERVENTION Left 11/18/2019   Procedure: PERIPHERAL VASCULAR INTERVENTION;  Surgeon: Lorretta Harp, MD;  Location: [place] [facility];  Service: Cardiovascular;  Laterality: Left;  left popliteal artery   RIGHT/LEFT HEART CATH AND CORONARY ANGIOGRAPHY N/A 10/29/2021   Procedure: RIGHT/LEFT HEART CATH AND CORONARY ANGIOGRAPHY;  Surgeon: Burnell Blanks, MD;  Location: [place] [facility];  Service: Cardiovascular;  Laterality: N/A;   TRANSCAROTID ARTERY REVASCULARIZATION  Left 12/16/2019   Procedure: TRANSCAROTID ARTERY REVASCULARIZATION;  Surgeon: Elam Dutch, MD;  Location: [place];  Service: Vascular;  Laterality: Left;   TRANSCAROTID ARTERY  REVASCULARIZATION  Right 02/03/2020   Procedure: RIGHT TRANSCAROTID ARTERY REVASCULARIZATION;  Surgeon: Elam Dutch, MD;  Location: [place];  Service: Vascular;  Laterality: Right;   TRANSCATHETER AORTIC VALVE REPLACEMENT, TRANSFEMORAL N/A 01/11/2022   Procedure: Transcatheter Aortic Valve Replacement, Transfemoral;  Surgeon: Sherren Mocha, MD;  Location: [place] [facility];  Service: Open Heart Surgery;  Laterality: N/A;   ULTRASOUND GUIDANCE FOR VASCULAR ACCESS Right 12/16/2019   Procedure: ULTRASOUND GUIDANCE FOR VASCULAR ACCESS;  Surgeon: Elam Dutch, MD;  [facility];  Service: Vascular;  Laterality: Right;   ULTRASOUND GUIDANCE FOR VASCULAR ACCESS Right 02/03/2020   Procedure: ULTRASOUND GUIDANCE FOR VASCULAR ACCESS;  Surgeon: Elam Dutch, MD;  Location: [facility] OR;  Service: Vascular;  Laterality: Right;   VASECTOMY     Family History  Family History  Problem Relation Age of Onset   CAD Father    Hypertension Father    Alcohol abuse Father        Cause of death   Diabetes Mother    Colon polyps Mother    CAD Brother 73       CABG   Colon cancer Neg Hx    Esophageal cancer Neg Hx    Rectal cancer Neg Hx    Stomach cancer Neg Hx    Social History  reports that he has never smoked. He has never used smokeless tobacco. He reports current alcohol use. He reports that he does not use drugs. Allergies  Allergies  Allergen Reactions   Codeine Nausea And Vomiting   Norco [Hydrocodone-Acetaminophen] Nausea And Vomiting   Percocet [Oxycodone-Acetaminophen] Nausea And Vomiting   Tramadol Hcl Nausea And Vomiting   Home medications Prior to Admission medications   Medication Sig Start Date End Date Taking? Authorizing Provider  ARTIFICIAL TEARS PF 0.1-0.3 % SOLN Place 1 drop into both eyes every 8 (eight) hours as needed (for dryness).   Yes [provider]  aspirin EC 81 MG tablet Take 81 mg by mouth in the morning.   Yes [provider]   atorvastatin (LIPITOR) 40 MG tablet Take 40 mg by mouth in the morning. 07/03/20  Yes [provider]  carvedilol (COREG) 12.5 MG tablet Take 1 tablet (12.5 mg total) by mouth 2 (two) times daily with a meal. 10/30/21 12/28/23 Yes Arrien, Jimmy Picket, MD  Cholecalciferol (VITAMIN D-3) 25 MCG (1000 UT) CAPS Take 1,000 Units by mouth daily.   Yes [provider]  clopidogrel (PLAVIX) 75 MG tablet Take 1 tablet (75 mg total) by mouth daily. 05/13/22  Yes Broadus John, MD  Continuous Blood Gluc Sensor (FREESTYLE LIBRE 14 DAY SENSOR) MISC Inject 1 Device into the skin every 14 (fourteen) days.   Yes [provider]  ferrous sulfate 325 (65 FE) MG tablet Take 325 mg by mouth daily with breakfast.   Yes [provider]  furosemide (LASIX) 40 MG tablet Take 40 mg by mouth daily at 4 PM.   Yes [provider]  furosemide (LASIX) 80 MG tablet Take 1  tablet (80 mg total) by mouth daily. Patient taking differently: Take 80 mg by mouth in the morning. 06/01/22  Yes Bonnielee Haff, MD  gabapentin (NEURONTIN) 800 MG tablet Take 800 mg by mouth at bedtime as needed (Sleep). 07/03/20  Yes [provider]  hydrALAZINE (APRESOLINE) 25 MG tablet TAKE 1 TABLET BY MOUTH THREE TIMES A DAY 06/07/22  Yes Lorretta Harp, MD  insulin NPH-regular Human (70-30) 100 UNIT/ML injection Inject 7-8 Units into the skin See admin instructions. Inject 8 units into the skin before breakfast and 7 units before the evening meal   Yes [provider]  isosorbide mononitrate (IMDUR) 30 MG 24 hr tablet Take 1 tablet (30 mg total) by mouth daily. 01/13/22 01/13/23 Yes Eileen Stanford, PA-C  NON FORMULARY Take 16 oz by mouth See admin instructions. [employer] Signature Organic Raw Kombucha, Ginger Lemonade- Drink 16 ounces by mouth once a day   Yes [provider]  polyethylene glycol (MIRALAX / GLYCOLAX) 17 g packet Take 17 g by mouth daily. Patient taking  differently: Take 17 g by mouth daily as needed for mild constipation. 06/02/22  Yes Bonnielee Haff, MD  Povidone-Iodine (BETADINE ANTISEPTIC EX) Apply 1 application  topically See admin instructions. Use as directed every other day for wound care (right heel site)   Yes [provider]  PRESCRIPTION MEDICATION Inject 0.1-1 mLs as directed See admin instructions. Tri-Mix Standard Strength 5 ml. Formula: Prostaglandin 10 mcg/ml, Papaverine 30 mg/ml, Phentolamine 1 mg/ml. Inject 0.1 ml into side of penis as directed as needed (to be injected immediately before sexual intercourse) may increase the dose by 0.1 ml every 48 hours to achieve an erection. Max dose 1 ml.   Yes [provider]  sacubitril-valsartan (ENTRESTO) 49-51 MG Take 1 tablet by mouth 2 (two) times daily. 03/07/22  Yes Sabharwal, Aditya, DO  TYLENOL 325 MG CAPS Take 650 mg by mouth every 6 (six) hours as needed (for pain).   Yes [provider]  vitamin B-12 (CYANOCOBALAMIN) 500 MCG tablet Take 500 mcg by mouth in the morning. 08/24/21  Yes [provider]  HYDROcodone-acetaminophen (NORCO/VICODIN) 5-325 MG tablet Take 1 tablet by mouth every 6 (six) hours as needed for moderate pain. Patient not taking: Reported on 06/07/2022 06/01/22 06/01/23  Bonnielee Haff, MD     Vitals:   06/07/22 2349 06/08/22 0328 06/08/22 0843 06/08/22 1335  BP:  (!) 137/97  113/89  Pulse:  81  78  Resp:  16  16  Temp:  97.6 F (36.4 C)  (!) 97.5 F (36.4 C)  TempSrc:    Oral  SpO2: 98% 100% 99% 97%   Exam Gen alert, no distress No rash, cyanosis or gangrene Sclera anicteric, throat clear  No jvd or bruits Chest clear bilat to bases, no rales/ wheezing RRR no MRG Abd soft ntnd no mass or ascites +bs GU defer MS no joint effusions or deformity Ext no LE or UE edema, no wounds or ulcers Neuro is alert, Ox 3 , nf     Home meds include - lasix 80 am and 40 pm, imdur 30, entresto bid, asa, lipitor, coreg 12.5 bid,  plavix, hydralazine 25 tid, insulin 70-30 bid, norco prn, prns/ vits/ supps     Date   Creat  eGFR   2009- 2014  0.70- 1.09   2015- 2020  0.90- 1.21   2021   1.01- 2.00   2022   1.19- 1.47   Jul 2023  1.39  Aug '23  1.27- 1.51  - 49 ml/min   Oct '23  1.10- 1.79   Nov '23  1.39       Dec '23  1.20- 1.36   05/12/2022  1.80  39 ml/min   3/08- 06/01/22  2.77 >> 1.70   3/19   2.97  22   06/08/22  3.63  17      B/l creat 1.51- 1.80 from aug '23- feb '24, eGFR 39 - 63ml/min    Last UA 3/09 - prot 30, 11-20 wbc, 0-5 rbc    Last renal US 3/09 - 10.9/ 10.1 cm kidneys w/o hydro, [facility] Pace bilat    Assessment/ Plan: AKI on CKD 3b - b/l creat 1.51- 1.80 from aug '23- feb '24, eGFR 39 - 81ml/min. Creat here is 2.9- 3.6 in setting of refractory heel pain and acute osteomyelitis. BP's are up, no vol excess on exam and pt was taking extra lasix last few days+  entresto (+ARB). Had recent US w/o hydro on 3/09, will not repeat. Got 2 L bolus in ED and IVF's overnight. AKI due to vol depletion+ARB vs other. Will resume IVF's, cont holding ARB and diuretics. Get UA, urine lytes. No indication for dialysis at this time. Will follow.  Hyperkalemia - no EKG changes, K+ 6.6, down to 5.1 after acute Rx HTN - getting coreg and hydralazine per pmd R heel/ foot infection - w/ large heel wound, per pmd, MRI pending DM2 - 25 yrs on insulin  HFrEF - last EF was 25-30% SP TAVR PAD      Rob Lakaisha Danish  MD [facility] 06/08/2022, 1:49 PM  Recent Labs  Lab 06/07/22 1218 06/08/22 0541 06/08/22 0828 06/08/22 1148  HGB 13.7 13.1  --   --   CALCIUM 8.7* 8.9 8.7*  --   CREATININE 2.97* 3.63* 3.50*  --   K 5.5* 6.6* 5.1 4.6   Inpatient medications:  aspirin  81 mg Oral Daily   atorvastatin  40 mg Oral Daily   carvedilol  12.5 mg Oral BID WC   clopidogrel  75 mg Oral Daily   docusate sodium  100 mg Oral BID   ferrous sulfate  325 mg Oral Q breakfast   heparin  5,000 Units Subcutaneous Q8H   hydrALAZINE  25 mg Oral TID    insulin aspart  0-15 Units Subcutaneous TID WC   insulin aspart  0-5 Units Subcutaneous QHS   insulin aspart protamine- aspart  8 Units Subcutaneous BID WC    cefTRIAXone (ROCEPHIN)  IV     DAPTOmycin (CUBICIN) 600 mg in sodium chloride 0.9 % IVPB 600 mg (06/08/22 1212)   HYDROcodone-acetaminophen, ipratropium-albuterol, LORazepam, ondansetron **OR** ondansetron (ZOFRAN) IV, polyethylene glycol, traZODone

## 2022-06-08 NOTE — Consult Note (Signed)
[facility] NOTE   MRN : [id number]  Reason for Consult: right heel wound PAD/DM Referring Physician: ED  History of Present Illness: 72 y/o male presented to the [facility] ED with increased heel pain and malodor.  He has a history of PAD managed with B  Popliteal stenting by Dr. Gwenlyn Found in 2021 for non healing LE wounds.  We are consulted to exam and provide improved inflow to heel the right heel wound.  He has acute osteomyelitis on MRI posterolateral calcaneus.  He is a diabetic.  He says his fasting blood sugars are in the 110-140 range.  He is medically managed for hypertension.  He takes a statin for hypercholesterolemia.  He was found to have acute kidney injury.  His creatinine today is 3.5.  His baseline is 1.5-1.8.  This is felt to be secondary to dehydration.   The patient has nonischemic cardiomyopathy with ejection fraction of 20%.  He has undergone TAVR.  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  He has 3 daughters.  His family is in [place].  One of his daughters is a pediatrician there.  He has been followed in the past by Dr. Oneida Alar and now  Dr. [name] in our office for ICA stent stenosis found on 05/13/22 to be > 75 % stenosed with EDV 149 on duplex and CTA demonstrates greater than 80% stenosis of the left carotid stent.       Current Facility-Administered Medications  Medication Dose Route Frequency Provider Last Rate Last Admin   aspirin chewable tablet 81 mg  81 mg Oral Daily Hollice Gong, Mir M, MD   81 mg at 06/08/22 1005   atorvastatin (LIPITOR) tablet 40 mg  40 mg Oral Daily Hollice Gong, Mir M, MD   40 mg at 06/08/22 1005   carvedilol (COREG) tablet 12.5 mg  12.5 mg Oral BID WC Hollice Gong, Mir M, MD   12.5 mg at 06/08/22 0754   cefTRIAXone (ROCEPHIN) 2 g in sodium chloride 0.9 % 100 mL IVPB  2 g Intravenous Q24H Rosiland Oz, MD       clopidogrel (PLAVIX) tablet 75 mg  75 mg Oral Daily Hollice Gong, Mir M, MD   75 mg at 06/08/22 1005    DAPTOmycin (CUBICIN) 600 mg in sodium chloride 0.9 % IVPB  8 mg/kg Intravenous Q48H Rolla Flatten, [facility] 124 mL/hr at 06/08/22 1212 600 mg at 06/08/22 1212   docusate sodium (COLACE) capsule 100 mg  100 mg Oral BID Hollice Gong, Mir M, MD   100 mg at 06/08/22 1005   ferrous sulfate tablet 325 mg  325 mg Oral Q breakfast Hollice Gong, Mir M, MD   325 mg at 06/08/22 0754   heparin injection 5,000 Units  5,000 Units Subcutaneous Q8H Hollice Gong, Mir M, MD   5,000 Units at 06/08/22 [id number]   hydrALAZINE (APRESOLINE) tablet 25 mg  25 mg Oral TID Lucillie Garfinkel, MD   25 mg at 06/08/22 1005   HYDROcodone-acetaminophen (NORCO/VICODIN) 5-325 MG per tablet 1 tablet  1 tablet Oral Q6H PRN Lucillie Garfinkel, MD   1 tablet at 06/08/22 1212   insulin aspart (novoLOG) injection 0-15 Units  0-15 Units Subcutaneous TID WC Hollice Gong Mir M, MD   8 Units at 06/08/22 1213   insulin aspart (novoLOG) injection 0-5 Units  0-5 Units Subcutaneous QHS Hollice Gong Mir M, MD   3 Units at 06/07/22 2245   insulin aspart protamine- aspart (NOVOLOG MIX 70/30) injection 8 Units  8  Units Subcutaneous BID WC Adhikari, Amrit, MD       ipratropium-albuterol (DUONEB) 0.5-2.5 (3) MG/3ML nebulizer solution 3 mL  3 mL Nebulization Q6H PRN Kathryne Eriksson, NP   3 mL at 06/07/22 2349   LORazepam (ATIVAN) tablet 1 mg  1 mg Oral Q4H PRN Hollice Gong, Mir M, MD       ondansetron [facility]) tablet 4 mg  4 mg Oral Q6H PRN Hollice Gong, Mir M, MD       Or   ondansetron [facility]) injection 4 mg  4 mg Intravenous Q6H PRN Hollice Gong, Mir M, MD   4 mg at 06/07/22 1801   polyethylene glycol (MIRALAX / GLYCOLAX) packet 17 g  17 g Oral Daily PRN Hollice Gong, Mir M, MD       traZODone (DESYREL) tablet 25 mg  25 mg Oral QHS PRN Hollice Gong, Mir M, MD        Pt meds include: Statin :Yes Betablocker: Yes ASA: Yes Other anticoagulants/antiplatelets: Plavix  Past Medical History:  Diagnosis Date   Carotid artery occlusion    Diabetes mellitus without  complication (HCC)    Type II   Dyslipidemia 09/27/2012   Erectile dysfunction 09/27/2012   GERD (gastroesophageal reflux disease)    Hypercholesteremia    Hyperlipidemia 08/12/2012   Hypertension    Hyponatremia 08/14/2012   Neuropathy    Pancreatitis    Pancreatitis, acute 09/27/2012   Peripheral vascular disease ([place])    S/P 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 Burt Knack & Bartle   Severe aortic stenosis    Vitamin D deficiency 06/23/2015    Past Surgical History:  Procedure Laterality Date   ABDOMINAL AORTOGRAM W/LOWER EXTREMITY N/A 08/08/2019   Procedure: ABDOMINAL AORTOGRAM W/LOWER EXTREMITY;  Surgeon: Lorretta Harp, MD;  Location: [place] [facility];  Service: Cardiovascular;  Laterality: N/A;   ABDOMINAL AORTOGRAM W/LOWER EXTREMITY N/A 11/18/2019   Procedure: ABDOMINAL AORTOGRAM W/LOWER EXTREMITY;  Surgeon: Lorretta Harp, MD;  Location: [place] [facility];  Service: Cardiovascular;  Laterality: N/A;   CARDIAC CATHETERIZATION     COLONOSCOPY  12 years ago   in [place] clinic= normal exam per pt   INTRAOPERATIVE TRANSTHORACIC ECHOCARDIOGRAM N/A 01/11/2022   Procedure: INTRAOPERATIVE TRANSTHORACIC ECHOCARDIOGRAM;  Surgeon: Sherren Mocha, MD;  Location: [place] [facility];  Service: Open Heart Surgery;  Laterality: N/A;   KNEE SURGERY     MULTIPLE EXTRACTIONS WITH ALVEOLOPLASTY N/A 12/09/2021   Procedure: MULTIPLE EXTRACTION;  Surgeon: Charlaine Dalton, DMD;  Location: [place];  Service: Dentistry;  Laterality: N/A;   PERIPHERAL VASCULAR INTERVENTION Right 08/08/2019   Procedure: PERIPHERAL VASCULAR INTERVENTION;  Surgeon: Lorretta Harp, MD;  Location: [place] [facility];  Service: Cardiovascular;  Laterality: Right;   PERIPHERAL VASCULAR INTERVENTION Left 11/18/2019   Procedure: PERIPHERAL VASCULAR INTERVENTION;  Surgeon: Lorretta Harp, MD;  Location: [place] [facility] LAB;  Service:  Cardiovascular;  Laterality: Left;  left popliteal artery   RIGHT/LEFT HEART CATH AND CORONARY ANGIOGRAPHY N/A 10/29/2021   Procedure: RIGHT/LEFT HEART CATH AND CORONARY ANGIOGRAPHY;  Surgeon: Burnell Blanks, MD;  Location: [place] [facility];  Service: Cardiovascular;  Laterality: N/A;   TRANSCAROTID ARTERY REVASCULARIZATION  Left 12/16/2019   Procedure: TRANSCAROTID ARTERY REVASCULARIZATION;  Surgeon: Elam Dutch, MD;  Location: [facility] OR;  Service: Vascular;  Laterality: Left;   TRANSCAROTID ARTERY REVASCULARIZATION  Right 02/03/2020   Procedure: RIGHT TRANSCAROTID ARTERY REVASCULARIZATION;  Surgeon: Elam Dutch, MD;  Location: [place];  Service: Vascular;  Laterality: Right;   TRANSCATHETER AORTIC VALVE REPLACEMENT, TRANSFEMORAL N/A 01/11/2022   Procedure: Transcatheter Aortic Valve Replacement, Transfemoral;  Surgeon: Sherren Mocha, MD;  Location: [place] [facility];  Service: Open Heart Surgery;  Laterality: N/A;   ULTRASOUND GUIDANCE FOR VASCULAR ACCESS Right 12/16/2019   Procedure: ULTRASOUND GUIDANCE FOR VASCULAR ACCESS;  Surgeon: Elam Dutch, MD;  Location[facility] OR;  Service: Vascular;  Laterality: Right;   ULTRASOUND GUIDANCE FOR VASCULAR ACCESS Right 02/03/2020   Procedure: ULTRASOUND GUIDANCE FOR VASCULAR ACCESS;  Surgeon: Elam Dutch, MD;  Location: [facility] OR;  Service: Vascular;  Laterality: Right;   VASECTOMY      Social History Social History   Tobacco Use   Smoking status: Never   Smokeless tobacco: Never  Vaping Use   Vaping Use: Never used  Substance Use Topics   Alcohol use: Yes    Comment: occasional   Drug use: No    Family History Family History  Problem Relation Age of Onset   CAD Father    Hypertension Father    Alcohol abuse Father        Cause of death   Diabetes Mother    Colon polyps Mother    CAD Brother 34       CABG   Colon cancer Neg Hx    Esophageal cancer Neg Hx    Rectal cancer Neg Hx    Stomach cancer Neg Hx      Allergies  Allergen Reactions   Codeine Nausea And Vomiting   Norco [Hydrocodone-Acetaminophen] Nausea And Vomiting   Percocet [Oxycodone-Acetaminophen] Nausea And Vomiting   Tramadol Hcl Nausea And Vomiting     REVIEW OF SYSTEMS  General: [ ]  Weight loss, [ ]  Fever, [ ]  chills Neurologic: [ ]  Dizziness, [ ]  Blackouts, [ ]  Seizure [ ]  Stroke, [ ]  "Mini stroke", [ ]  Slurred speech, [ ]  Temporary blindness; [ ]  weakness in arms or legs, [ ]  Hoarseness [ ]  Dysphagia Cardiac: [ ]  Chest pain/pressure, [ ]  Shortness of breath at rest [x ] Shortness of breath with exertion, [ ]  Atrial fibrillation or irregular heartbeat  Vascular: [ ]  Pain in legs with walking, [x ] Pain in legs at rest, [ ]  Pain in legs at night,  [x ] Non-healing ulcer, [ ]  Blood clot in vein/DVT,   Pulmonary: [ ]  Home oxygen, [ ]  Productive cough, [ ]  Coughing up blood, [ ]  Asthma,  [ ]  Wheezing [ ]  COPD Musculoskeletal:  [ ]  Arthritis, [ ]  Low back pain, [ ]  Joint pain Hematologic: [ ]  Easy Bruising, [ ]  Anemia; [ ]  Hepatitis Gastrointestinal: [ ]  Blood in stool, [ ]  Gastroesophageal Reflux/heartburn, Urinary: [ x] chronic Kidney disease, [ ]  on HD - [ ]  MWF or [ ]  TTHS, [ ]  Burning with urination, [ ]  Difficulty urinating Skin: [ ]  Rashes, [x ] Wounds Psychological: [ ]  Anxiety, [ ]  Depression  Physical Examination Vitals:   06/07/22 2349 06/08/22 0328 06/08/22 0843 06/08/22 1335  BP:  (!) 137/97  113/89  Pulse:  81  78  Resp:  16  16  Temp:  97.6 F (36.4 C)  (!) 97.5 F (36.4 C)  TempSrc:    Oral  SpO2: 98% 100% 99% 97%   There is no height or weight on file to calculate BMI.  General:  WDWN in NAD Gait: Normal HENT: WNL Eyes: Pupils equal Pulmonary: normal  non-labored breathing  Cardiac: RRR Abdomen: soft, NT, no masses Skin: no rashes,  no cellulitis;    Vascular Exam/Pulses: Nonpalpable pedal pulses.  He has palpable femoral pulses   Musculoskeletal: no muscle wasting or atrophy; no  edema  Neurologic: A&O X 3; Appropriate Affect ;  SENSATION: normal; MOTOR FUNCTION: 5/5 Symmetric Speech is fluent/normal   Significant Diagnostic Studies: CBC Lab Results  Component Value Date   WBC 14.6 (H) 06/08/2022   HGB 13.1 06/08/2022   HCT 41.8 06/08/2022   MCV 83.9 06/08/2022   PLT 262 06/08/2022    BMET    Component Value Date/Time   NA 126 (L) 06/08/2022 0828   NA 142 02/16/2022 1544   K 4.6 06/08/2022 1148   CL 93 (L) 06/08/2022 0828   CO2 22 06/08/2022 0828   GLUCOSE 293 (H) 06/08/2022 0828   BUN 84 (H) 06/08/2022 0828   BUN 22 02/16/2022 1544   CREATININE 3.50 (H) 06/08/2022 0828   CREATININE 1.36 (H) 03/09/2022 1233   CREATININE 0.86 06/06/2016 1455   CALCIUM 8.7 (L) 06/08/2022 0828   GFRNONAA 18 (L) 06/08/2022 0828   GFRNONAA 56 (L) 03/09/2022 1233   GFRNONAA 87 04/02/2013 1632   GFRAA >60 12/17/2019 0354   GFRAA >89 04/02/2013 1632   Estimated Creatinine Clearance: 17.8 mL/min (A) (by C-G formula based on SCr of 3.5 mg/dL (H)).  COAG Lab Results  Component Value Date   INR 1.0 01/11/2022   INR 0.9 01/30/2020   INR 1.0 12/11/2019     Non-Invasive Vascular Imaging: 05/29/22 ABI Findings:  +--------+------------------+-----+----------+--------+  Right  Rt Pressure (mmHg)IndexWaveform  Comment   +--------+------------------+-----+----------+--------+  [id number]                   triphasic           +--------+------------------+-----+----------+--------+  PTA    88                0.71 monophasic          +--------+------------------+-----+----------+--------+  DP     74                0.60 monophasic          +--------+------------------+-----+----------+--------+   +---------+------------------+-----+----------+-------+  Left    Lt Pressure (mmHg)IndexWaveform  Comment  +---------+------------------+-----+----------+-------+  Brachial 116                    triphasic           +---------+------------------+-----+----------+-------+  PTA     116               0.94 monophasic         +---------+------------------+-----+----------+-------+  DP      91                0.73 monophasic         +---------+------------------+-----+----------+-------+  Great Toe54                0.44                    +---------+------------------+-----+----------+-------+   +-------+-----------+-----------+------------+------------+  ABI/TBIToday's ABIToday's TBIPrevious ABIPrevious TBI  +-------+-----------+-----------+------------+------------+  Right 0.71                  0.63        0.62          +-------+-----------+-----------+------------+------------+  Left  0.94       0.44  0.76        0.66          +-------+-----------+-----------+------------+------------+    Summary:  Right: Resting right ankle-brachial index indicates moderate right lower  extremity arterial disease.   Unable to obtain TBI due to low amplitude/absent waveforms.  Left: Resting left ankle-brachial index indicates mild left lower  extremity arterial disease. The left toe-brachial index is abnormal.   Right foot MRI: 1. Acute osteomyelitis of the posterolateral aspect of the calcaneus. 2. Increased intramuscular signal of the flexor hallucis longus and plantar muscles of the foot suggesting diabetic myopathy/myositis. 3. Deep skin wound with subcutaneous edema about the posterolateral aspect of the heel suggesting chronic diabetic ulcer. No fluid collection or abscess.  ASSESSMENT/PLAN:   Right calcaneal osteomyelitis in the setting of PAD and known diabetic: I discussed with the patient that this is a limb threatening situation.  He needs to undergo angiography to define his anatomy and optimize his blood flow for limb salvage.  Unfortunately, because of his sepsis he has developed acute kidney injury with a creatinine of 3.5.  I discussed with him that I  would like to delay angiography until his creatinine is back to baseline so as to minimize risk of worsening his kidney injury.  He is scheduled to be transferred to [facility].  We will continue to follow him while he was in the hospital and plan on revascularization once his renal function improves.  [name], MD, FACS [facility] [place] ([phone]

## 2022-06-08 NOTE — Consult Note (Addendum)
[facility]                                                                                        Patient Identification: Patient Name: Caleb [name] MRN: [id number] [place] Date: 06/07/2022 11:12 AM Today's Date: 06/08/2022 Reason for consult: acute osteomyelitis Requesting provider: Dr Tawanna Solo  Principal Problem:   SIRS (systemic inflammatory response syndrome) ([place]) Active Problems:   HTN (hypertension)   Hyperlipidemia   Hyponatremia   Dyslipidemia   Type 2 diabetes mellitus with hyperlipidemia (HCC)   PAD (peripheral artery disease) (HCC)   S/P TAVR (transcatheter aortic valve replacement)   Decubitus ulcer of right heel   Antibiotics:  Linezolid 3/19-c Cefepime 3/19-c  Lines/Hardware:  Assessment 72 Y O male with PMH as below including type II DM with neuropathy s/p partial left lateral forefoot amputation, CHFrEF, PVD s/p peripheral vascular intervention, Carotid artery occlusion s/p trans-carotid artery revascularization, severe aortic stenosis status post TAVR in 12/2021 presented to the ED from home with increasing pain from Rt ulcer and foul-smelling discharge for the last few days. Admitted with   # RT heel DFU/acute osteomyelitis  # PVD s/p intervention in the past  ABI 3/10 moderate PA in the rt leg and mild PAD in the left leg  # DM 2 with neuropathy  # AKI on CKD # Hyperkalemia   Recommendations  Will switch linezolid to daptomycin, monitor CPK Will switch cefepime to ceftriaxone. Low concerns for PsA. He has h/o swimming in but at a pool at [employer] ( chlorinated water )  Fu blood cx Baseline ESR and CRP ordered  Orthopedics has been consulted, who in turn has recommended vascular surgery consult  as well given h/o PVD. Patient is being transferred to [facility] for vascular surgery evaluation Will notify [facility] ID team when transferred D/w ID Pharm D and primary   Rest of  the management as per the primary team. Please call with questions or concerns.  Thank you for the consult  Rosiland Oz, MD Infectious Disease Physician [facility] [address] [place], [place] [address] Phone: [phone]  Fax: [phone]  __________________________________________________________________________________________________________ HPI and Hospital Course: 64 Y O male with PMH as below including type II DM with neuropathy s/p partial left partial forefoot amputation, CHFrEF, PVD s/p peripheral vascular intervention s/p trans-carotid artery revascularization, carotid artery occlusion s/p  severe aortic stenosis status post TAVR in 12/2021 presented to the ED from home with increasing right heel pain from ulcer and foul-smelling discharge for the last few days and was asked to come in to ED by daughter. Reports he developed the rt heel ulcer around 1.5 to 2 months ago and it had started to close up. He describes himself as physically active and ;goes to gym where he also swims. He noticed possibly a piece of bone had fallen off from rt heel when he was swimming 3 weeks ago at the pool in the gym.  Denies any fever, chills or bodyaches.  Denies any nausea, vomiting or diarrhea.  Denies any shortness of breath or chest pain or lower  extremity swelling.  Denies taking any PO abtx prior to admit. Reports having neuropathy at legs with poor to no sensation. Recently discharged on 3/13 he was managed for AKI on CKD, acute on chronic CHF  ROS: General- Denies fever, chills, loss of appetite and loss of weight HEENT - Denies headache, blurry vision, neck pain, sinus pain Chest - Denies any chest pain, SOB or cough CVS- Denies any dizziness/lightheadedness, syncopal attacks, palpitations Abdomen- Denies any nausea, vomiting, abdominal pain, hematochezia and diarrhea Neuro - Denies any weakness, numbness, tingling sensation Psych -  Denies any changes in mood irritability or depressive symptoms GU- Denies any burning, dysuria, hematuria or increased frequency of urination Skin - denies any rashes/lesions MSK - denies any joint pain/swelling or restricted ROM   Past Medical History:  Diagnosis Date   Carotid artery occlusion    Diabetes mellitus without complication ([place])    Type II   Dyslipidemia 09/27/2012   Erectile dysfunction 09/27/2012   GERD (gastroesophageal reflux disease)    Hypercholesteremia    Hyperlipidemia 08/12/2012   Hypertension    Hyponatremia 08/14/2012   Neuropathy    Pancreatitis    Pancreatitis, acute 09/27/2012   Peripheral vascular disease ([place])    S/P 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 Burt Knack & Bartle   Severe aortic stenosis    Vitamin D deficiency 06/23/2015   Past Surgical History:  Procedure Laterality Date   ABDOMINAL AORTOGRAM W/LOWER EXTREMITY N/A 08/08/2019   Procedure: ABDOMINAL AORTOGRAM W/LOWER EXTREMITY;  Surgeon: Lorretta Harp, MD;  Location: [place] [facility];  Service: Cardiovascular;  Laterality: N/A;   ABDOMINAL AORTOGRAM W/LOWER EXTREMITY N/A 11/18/2019   Procedure: ABDOMINAL AORTOGRAM W/LOWER EXTREMITY;  Surgeon: Lorretta Harp, MD;  Location: [place] [facility];  Service: Cardiovascular;  Laterality: N/A;   CARDIAC CATHETERIZATION     COLONOSCOPY  12 years ago   in [place] clinic= normal exam per pt   INTRAOPERATIVE TRANSTHORACIC ECHOCARDIOGRAM N/A 01/11/2022   Procedure: INTRAOPERATIVE TRANSTHORACIC ECHOCARDIOGRAM;  Surgeon: Sherren Mocha, MD;  Location: [place] [facility];  Service: Open Heart Surgery;  Laterality: N/A;   KNEE SURGERY     MULTIPLE EXTRACTIONS WITH ALVEOLOPLASTY N/A 12/09/2021   Procedure: MULTIPLE EXTRACTION;  Surgeon: Charlaine Dalton, DMD;  Location: [place];  Service: Dentistry;  Laterality: N/A;   PERIPHERAL VASCULAR INTERVENTION Right 08/08/2019    Procedure: PERIPHERAL VASCULAR INTERVENTION;  Surgeon: Lorretta Harp, MD;  Location: [place] [facility];  Service: Cardiovascular;  Laterality: Right;   PERIPHERAL VASCULAR INTERVENTION Left 11/18/2019   Procedure: PERIPHERAL VASCULAR INTERVENTION;  Surgeon: Lorretta Harp, MD;  Location: [facility];  Service: Cardiovascular;  Laterality: Left;  left popliteal artery   RIGHT/LEFT HEART CATH AND CORONARY ANGIOGRAPHY N/A 10/29/2021   Procedure: RIGHT/LEFT HEART CATH AND CORONARY ANGIOGRAPHY;  Surgeon: Burnell Blanks, MD;  Location: [place] [facility];  Service: Cardiovascular;  Laterality: N/A;   TRANSCAROTID ARTERY REVASCULARIZATION  Left 12/16/2019   Procedure: TRANSCAROTID ARTERY REVASCULARIZATION;  Surgeon: Elam Dutch, MD;  Location: [place];  Service: Vascular;  Laterality: Left;   TRANSCAROTID ARTERY REVASCULARIZATION  Right 02/03/2020   Procedure: RIGHT TRANSCAROTID ARTERY REVASCULARIZATION;  Surgeon: Elam Dutch, MD;  Location: [place];  Service: Vascular;  Laterality: Right;   TRANSCATHETER AORTIC VALVE REPLACEMENT, TRANSFEMORAL N/A 01/11/2022   Procedure: Transcatheter Aortic Valve Replacement, Transfemoral;  Surgeon: Sherren Mocha, MD;  Location: [place] [facility];  Service: Open Heart Surgery;  Laterality: N/A;   ULTRASOUND GUIDANCE FOR VASCULAR ACCESS Right 12/16/2019   Procedure: ULTRASOUND GUIDANCE FOR VASCULAR ACCESS;  Surgeon: Elam Dutch, MD;  Location: [facility] OR;  Service: Vascular;  Laterality: Right;   ULTRASOUND GUIDANCE FOR VASCULAR ACCESS Right 02/03/2020   Procedure: ULTRASOUND GUIDANCE FOR VASCULAR ACCESS;  Surgeon: Elam Dutch, MD;  Location[facility];  Service: Vascular;  Laterality: Right;   VASECTOMY       Scheduled Meds:  albuterol  10 mg Nebulization STAT   aspirin  81 mg Oral Daily   atorvastatin  40 mg Oral Daily   carvedilol  12.5 mg Oral BID WC   clopidogrel  75 mg Oral Daily   docusate sodium  100 mg Oral BID    ferrous sulfate  325 mg Oral Q breakfast   heparin  5,000 Units Subcutaneous Q8H   hydrALAZINE  25 mg Oral TID   insulin aspart  0-15 Units Subcutaneous TID WC   insulin aspart  0-5 Units Subcutaneous QHS   insulin aspart protamine- aspart  5 Units Subcutaneous BID WC   Continuous Infusions:  calcium gluconate 1,000 mg (06/08/22 0756)   ceFEPime (MAXIPIME) IV 2 g (06/07/22 1624)   linezolid (ZYVOX) IV 600 mg (06/07/22 2248)   PRN Meds:.HYDROcodone-acetaminophen, ipratropium-albuterol, LORazepam, ondansetron **OR** ondansetron (ZOFRAN) IV, polyethylene glycol, traZODone  Allergies  Allergen Reactions   Codeine Nausea And Vomiting   Norco [Hydrocodone-Acetaminophen] Nausea And Vomiting   Percocet [Oxycodone-Acetaminophen] Nausea And Vomiting   Tramadol Hcl Nausea And Vomiting   Social History   Socioeconomic History   Marital status: Widowed    Spouse name: Not on file   Number of children: 3   Years of education: Not on file   Highest education level: High school graduate  Occupational History   Occupation: Caregiver    Comment: Special needs    Comment: retired  Tobacco Use   Smoking status: Never   Smokeless tobacco: Never  Vaping Use   Vaping Use: Never used  Substance and Sexual Activity   Alcohol use: Yes    Comment: occasional   Drug use: No   Sexual activity: Not Currently  Other Topics Concern   Not on file  Social History Narrative   Widower.  3 daughters and 3 grandchildren.     Social Determinants of Health   Financial Resource Strain: Low Risk  (10/29/2021)   Overall Financial Resource Strain (CARDIA)    Difficulty of Paying Living Expenses: Not hard at all  Food Insecurity: No Food Insecurity (06/02/2022)   Hunger Vital Sign    Worried About Running Out of Food in the Last Year: Never true    Ran Out of Food in the Last Year: Never true  Transportation Needs: No Transportation Needs (06/02/2022)   PRAPARE - Hydrologist  (Medical): No    Lack of Transportation (Non-Medical): No  Physical Activity: Sufficiently Active (12/06/2020)   Exercise Vital Sign    Days of Exercise per Week: 4 days    Minutes of Exercise per Session: 60 min  Stress: No Stress Concern Present (12/06/2020)   [place]    Feeling of Stress : Not at all  Social Connections: Moderately Isolated (12/06/2020)   Social Connection and Isolation Panel [NHANES]    Frequency of Communication with Friends and Family: More than three times a week  Frequency of Social Gatherings with Friends and Family: More than three times a week    Attends Religious Services: More than 4 times per year    Active Member of Clubs or Organizations: No    Attends Archivist Meetings: Never    Marital Status: Widowed  Intimate Partner Violence: Not At Risk (05/27/2022)   Humiliation, Afraid, Rape, and Kick questionnaire    Fear of Current or Ex-Partner: No    Emotionally Abused: No    Physically Abused: No    Sexually Abused: No   Family History  Problem Relation Age of Onset   CAD Father    Hypertension Father    Alcohol abuse Father        Cause of death   Diabetes Mother    Colon polyps Mother    CAD Brother 38       CABG   Colon cancer Neg Hx    Esophageal cancer Neg Hx    Rectal cancer Neg Hx    Stomach cancer Neg Hx    Vitals BP (!) 137/97 (BP Location: Right Arm)   Pulse 81   Temp 97.6 F (36.4 C)   Resp 16   SpO2 100% '   Physical Exam Constitutional:  elderly male sitting up in the bed and having breakfast    Comments: appears comfortable   Cardiovascular:     Rate and Rhythm: Normal rate and regular rhythm.     Heart sounds: s1s2.   Pulmonary:     Effort: Pulmonary effort is normal on room air     Comments: Normal breath sounds   Abdominal:     Palpations: Abdomen is soft.     Tenderness: non distended and non tender   Musculoskeletal:         General: No swelling or tenderness in peripheral joints. RT heel ulcer as below with no surrounding cellulitis or fluctuance or crepitus, ROM of rt ankle good.      Skin:    Comments:   Neurological:     General: awake, alert and oriented, grossly non focal   Psychiatric:        Mood and Affect: Mood normal.    Pertinent Microbiology Results for orders placed or performed during the hospital encounter of 05/27/22  Resp panel by RT-PCR (RSV, Flu A&B, Covid) Anterior Nasal Swab     Status: None   Collection Time: 05/27/22  3:22 PM   Specimen: Anterior Nasal Swab  Result Value Ref Range Status   SARS Coronavirus 2 by RT PCR NEGATIVE NEGATIVE Final    Comment: (NOTE) SARS-CoV-2 target nucleic acids are NOT DETECTED.  The SARS-CoV-2 RNA is generally detectable in upper respiratory specimens during the acute phase of infection. The lowest concentration of SARS-CoV-2 viral copies this assay can detect is 138 copies/mL. A negative result does not preclude SARS-Cov-2 infection and should not be used as the sole basis for treatment or other patient management decisions. A negative result may occur with  improper specimen collection/handling, submission of specimen other than nasopharyngeal swab, presence of viral mutation(s) within the areas targeted by this assay, and inadequate number of viral copies(<138 copies/mL). A negative result must be combined with clinical observations, patient history, and epidemiological information. The expected result is Negative.  Fact Sheet for Patients:  EntrepreneurPulse.com.au  Fact Sheet for Healthcare Providers:  IncredibleEmployment.be  This test is no t yet approved or cleared by the [place] FDA and  has been authorized for detection and/or  diagnosis of SARS-CoV-2 by FDA under an Emergency Use Authorization (EUA). This EUA will remain  in effect (meaning this test can be used) for the duration of  the COVID-19 declaration under Section 564(b)(1) of the Act, 21 U.S.C.section 360bbb-3(b)(1), unless the authorization is terminated  or revoked sooner.       Influenza A by PCR NEGATIVE NEGATIVE Final   Influenza B by PCR NEGATIVE NEGATIVE Final    Comment: (NOTE) The Xpert Xpress SARS-CoV-2/FLU/RSV plus assay is intended as an aid in the diagnosis of influenza from Nasopharyngeal swab specimens and should not be used as a sole basis for treatment. Nasal washings and aspirates are unacceptable for Xpert Xpress SARS-CoV-2/FLU/RSV testing.  Fact Sheet for Patients: EntrepreneurPulse.com.au  Fact Sheet for Healthcare Providers: IncredibleEmployment.be  This test is not yet approved or cleared by the [place] FDA and has been authorized for detection and/or diagnosis of SARS-CoV-2 by FDA under an Emergency Use Authorization (EUA). This EUA will remain in effect (meaning this test can be used) for the duration of the COVID-19 declaration under Section 564(b)(1) of the Act, 21 U.S.C. section 360bbb-3(b)(1), unless the authorization is terminated or revoked.     Resp Syncytial Virus by PCR NEGATIVE NEGATIVE Final    Comment: (NOTE) Fact Sheet for Patients: EntrepreneurPulse.com.au  Fact Sheet for Healthcare Providers: IncredibleEmployment.be  This test is not yet approved or cleared by the [place] FDA and has been authorized for detection and/or diagnosis of SARS-CoV-2 by FDA under an Emergency Use Authorization (EUA). This EUA will remain in effect (meaning this test can be used) for the duration of the COVID-19 declaration under Section 564(b)(1) of the Act, 21 U.S.C. section 360bbb-3(b)(1), unless the authorization is terminated or revoked.  Performed at [facility], [place] [address], [place], [place] [address]    Pertinent Lab seen by me:    Latest Ref Rng & Units  06/08/2022    5:41 AM 06/07/2022   12:18 PM 05/30/2022    3:40 AM  CBC  WBC 4.0 - 10.5 K/uL 14.6  13.9  6.6   Hemoglobin 13.0 - 17.0 g/dL 13.1  13.7  14.7   Hematocrit 39.0 - 52.0 % 41.8  41.6  45.3   Platelets 150 - 400 K/uL 262  267  324       Latest Ref Rng & Units 06/08/2022    5:41 AM 06/07/2022   12:18 PM 06/01/2022    7:26 AM  CMP  Glucose 70 - 99 mg/dL 327  270  207   BUN 8 - 23 mg/dL 86  75  60   Creatinine 0.61 - 1.24 mg/dL 3.63  2.97  1.70   Sodium 135 - 145 mmol/L 129  128  133   Potassium 3.5 - 5.1 mmol/L 6.6  5.5  4.9   Chloride 98 - 111 mmol/L 93  94  92   CO2 22 - 32 mmol/L 24  20  31    Calcium 8.9 - 10.3 mg/dL 8.9  8.7  9.0      Pertinent Imagings/Other Imagings Plain films and CT images have been personally visualized and interpreted; radiology reports have been reviewed. Decision making incorporated into the Impression / Recommendations.  MR FOOT RIGHT WO CONTRAST  Result Date: 06/07/2022 CLINICAL DATA:  Increasing right heel pain from ulcer for 1 month. Diabetic peripheral neuropathy EXAM: MRI OF THE RIGHT FOOT WITHOUT CONTRAST TECHNIQUE: Multiplanar, multisequence MR imaging of the right hindfoot was performed. No intravenous contrast was administered.  COMPARISON:  RADIOGRAPHS DATED June 07, 2022 FINDINGS: Bones/Joint/Cartilage There is cortical erosion and marrow edema of the posterolateral aspect of the calcaneus consistent with osteomyelitis. There is heterogeneous signal of the talar dome which is likely sequela of chronic osteochondral injury, likely a chronic process. No evidence of acute edema. Ligaments Ligaments of the lateral and medial compartment of the ankle appear intact. Muscles and Tendons Increased intramuscular signal of the flexor hallucis longus and plantar muscles of the foot suggesting diabetic myopathy/myositis. Soft tissues Deep skin wound with subcutaneous edema about the posterolateral aspect of the heel suggesting chronic diabetic ulcer. No  fluid collection or abscess. Mild generalized subcutaneous soft tissue edema about the ankle and foot. IMPRESSION: 1. Acute osteomyelitis of the posterolateral aspect of the calcaneus. 2. Increased intramuscular signal of the flexor hallucis longus and plantar muscles of the foot suggesting diabetic myopathy/myositis. 3. Deep skin wound with subcutaneous edema about the posterolateral aspect of the heel suggesting chronic diabetic ulcer. No fluid collection or abscess. Electronically Signed   By: Keane Police D.O.   On: 06/07/2022 22:05   DG Foot Complete Right  Result Date: 06/07/2022 CLINICAL DATA:  Diabetic foot ulcer EXAM: RIGHT FOOT COMPLETE - 3+ VIEW COMPARISON:  05/12/2022 FINDINGS: Deep soft tissue ulceration at the posterior aspect of the right heel. No erosion or periosteal elevation is evident of the underlying posterior calcaneus. No acute fracture or dislocation. Small plantar calcaneal spur. Similar degree of mild degenerative changes. Atherosclerotic vascular calcifications. No abnormally tracking soft tissue gas is seen. IMPRESSION: Deep soft tissue ulceration at the posterior aspect of the right heel. No radiographic evidence of osteomyelitis. Electronically Signed   By: Davina Poke D.O.   On: 06/07/2022 11:58   VAS [place] ABI WITH/WO TBI  Result Date: 05/29/2022  LOWER EXTREMITY DOPPLER STUDY Patient Name:  Caleb [name]  Date of Exam:   05/29/2022 Medical Rec #: [id number]        Accession #:    [id number] Date of Birth: 1950/05/01         Patient Gender: M Patient Age:   32 years Exam Location:  [facility] Procedure:      VAS [place] ABI WITH/WO TBI Referring Phys: RIPUDEEP RAI --------------------------------------------------------------------------------  Indications: Peripheral artery disease. High Risk Factors: Hyperlipidemia, Diabetes.  Comparison Study: 06/03/2020 - Right: Resting right ankle-brachial index is                   within normal range. No                   evidence  of significant right lower extremity arterial                   disease. The right                   toe-brachial index is normal.                    Left: Resting left ankle-brachial index indicates moderate                   left lower                   extremity arterial disease. The left toe-brachial index is                   abnormal.  05/19/2021 - Right: Resting right ankle-brachial index indicates                   moderate right lower                   extremity arterial disease. The right toe-brachial index is                   abnormal.                    Left: Resting left ankle-brachial index indicates moderate                   left lower                   extremity arterial disease. The left toe-brachial index is                   abnormal. Performing Technologist: Carlos Levering RVT  Examination Guidelines: A complete evaluation includes at minimum, Doppler waveform signals and systolic blood pressure reading at the level of bilateral brachial, anterior tibial, and posterior tibial arteries, when vessel segments are accessible. Bilateral testing is considered an integral part of a complete examination. Photoelectric Plethysmograph (PPG) waveforms and toe systolic pressure readings are included as required and additional duplex testing as needed. Limited examinations for reoccurring indications may be performed as noted.  ABI Findings: +--------+------------------+-----+----------+--------+ Right   Rt Pressure (mmHg)IndexWaveform  Comment  +--------+------------------+-----+----------+--------+ [id number]                    triphasic          +--------+------------------+-----+----------+--------+ PTA     88                0.71 monophasic         +--------+------------------+-----+----------+--------+ DP      74                0.60 monophasic         +--------+------------------+-----+----------+--------+ +---------+------------------+-----+----------+-------+  Left     Lt Pressure (mmHg)IndexWaveform  Comment +---------+------------------+-----+----------+-------+ Brachial 116                    triphasic         +---------+------------------+-----+----------+-------+ PTA      116               0.94 monophasic        +---------+------------------+-----+----------+-------+ DP       91                0.73 monophasic        +---------+------------------+-----+----------+-------+ Great Toe54                0.44                   +---------+------------------+-----+----------+-------+ +-------+-----------+-----------+------------+------------+ ABI/TBIToday's ABIToday's TBIPrevious ABIPrevious TBI +-------+-----------+-----------+------------+------------+ Right  0.71                  0.63        0.62         +-------+-----------+-----------+------------+------------+ Left   0.94       0.44       0.76        0.66         +-------+-----------+-----------+------------+------------+  Summary: Right: Resting right ankle-brachial index indicates moderate right lower extremity arterial disease. Unable to obtain TBI due to low  amplitude/absent waveforms. Left: Resting left ankle-brachial index indicates mild left lower extremity arterial disease. The left toe-brachial index is abnormal. ABI's are likely falsely elevated due to medial calcification. *See table(s) above for measurements and observations.  Electronically signed by Jamelle Haring on 05/29/2022 at 4:33:22 PM.    Final    DG CHEST PORT 1 VIEW  Result Date: 05/29/2022 CLINICAL DATA:  72 year old male with history of pulmonary edema. EXAM: PORTABLE CHEST 1 VIEW COMPARISON:  Chest x-ray 05/2022. FINDINGS: Lung volumes are normal. No consolidative airspace disease. No pleural effusions. No pneumothorax. No evidence of pulmonary edema. Heart size is mildly enlarged. Upper mediastinal contours are within normal limits. Status post TAVR. IMPRESSION: 1. No radiographic evidence of  acute cardiopulmonary disease. 2. Mild cardiomegaly (unchanged). 3. Status post TAVR. Electronically Signed   By: Vinnie Langton M.D.   On: 05/29/2022 10:06   ECHOCARDIOGRAM COMPLETE  Result Date: 05/28/2022    ECHOCARDIOGRAM REPORT   Patient Name:   Caleb [name] Date of Exam: 05/28/2022 Medical Rec #:  [id number]       Height:       67.0 in Accession #:    [id number]      Weight:       185.0 lb Date of Birth:  04-22-1950        BSA:          1.956 m Patient Age:    63 years        BP:           114/86 mmHg Patient Gender: M               HR:           92 bpm. Exam Location:  Inpatient Procedure: 2D Echo, Color Doppler and Cardiac Doppler Indications:    dyspnea  History:        Patient has prior history of Echocardiogram examinations, most                 recent 02/16/2022. CHF, PVD. chronic kidney disease.; Risk                 Factors:Diabetes, Hypertension and Dyslipidemia.                 Aortic Valve: 26 mm Sapien prosthetic, stented (TAVR) valve is                 present in the aortic position. Procedure Date: 01/11/22.  Sonographer:    Johny Chess RDCS Referring Phys: [id number] [place]  1. Left ventricular ejection fraction, by estimation, is 25 to 30%. Left ventricular ejection fraction by 2D MOD biplane is 26.8 %. The left ventricle has severely decreased function. The left ventricle demonstrates global hypokinesis. There is mild left ventricular hypertrophy. Left ventricular diastolic parameters are consistent with Grade II diastolic dysfunction (pseudonormalization). Elevated left ventricular end-diastolic pressure. The E/e' is 11. There is severe akinesis of the left ventricular, mid-apical anteroseptal wall, septal wall, anterior wall, inferoapical segment and anterolateral wall.  2. Right ventricular systolic function is mildly reduced. The right ventricular size is normal. There is moderately elevated pulmonary artery systolic pressure. The estimated right ventricular  systolic pressure is 0000000 mmHg.  3. Left atrial size was mildly dilated.  4. The mitral valve is abnormal. Mild mitral valve regurgitation.  5. The aortic valve has been repaired/replaced. Aortic valve regurgitation is not visualized. There is a 26 mm Sapien prosthetic (TAVR) valve present in the  aortic position. Procedure Date: 01/11/22. Aortic valve area, by VTI measures 1.03 cm. Aortic valve mean gradient measures 8.5 mmHg. Aortic valve Vmax measures 2.05 m/s.  6. The inferior vena cava is dilated in size with <50% respiratory variability, suggesting right atrial pressure of 15 mmHg. Comparison(s): Changes from prior study are noted. 01/12/2022: LVEF 20-25%, global HK, TAVR MG 5.7 mmHg. FINDINGS  Left Ventricle: Left ventricular ejection fraction, by estimation, is 25 to 30%. Left ventricular ejection fraction by 2D MOD biplane is 26.8 %. The left ventricle has severely decreased function. The left ventricle demonstrates global hypokinesis. Severe akinesis of the left ventricular, mid-apical anteroseptal wall, septal wall, anterior wall, inferoapical segment and anterolateral wall. The left ventricular internal cavity size was normal in size. There is mild left ventricular hypertrophy. Left  ventricular diastolic parameters are consistent with Grade II diastolic dysfunction (pseudonormalization). Elevated left ventricular end-diastolic pressure. The E/e' is 76.  LV Wall Scoring: The mid and distal anterior septum, entire apex, and mid inferoseptal segment are akinetic. Right Ventricle: The right ventricular size is normal. No increase in right ventricular wall thickness. Right ventricular systolic function is mildly reduced. There is moderately elevated pulmonary artery systolic pressure. The tricuspid regurgitant velocity is 3.24 m/s, and with an assumed right atrial pressure of 15 mmHg, the estimated right ventricular systolic pressure is 0000000 mmHg. Left Atrium: Left atrial size was mildly dilated. Right  Atrium: Right atrial size was normal in size. Pericardium: There is no evidence of pericardial effusion. Mitral Valve: The mitral valve is abnormal. There is mild calcification of the posterior mitral valve leaflet(s). Mild to moderate mitral annular calcification. Mild mitral valve regurgitation. MV peak gradient, 6.4 mmHg. The mean mitral valve gradient is 3.0 mmHg. Tricuspid Valve: The tricuspid valve is grossly normal. Tricuspid valve regurgitation is mild. Aortic Valve: The aortic valve has been repaired/replaced. Aortic valve regurgitation is not visualized. Aortic valve mean gradient measures 8.5 mmHg. Aortic valve peak gradient measures 16.9 mmHg. Aortic valve area, by VTI measures 1.03 cm. There is a 26 mm Sapien prosthetic, stented (TAVR) valve present in the aortic position. Procedure Date: 01/11/22. Pulmonic Valve: The pulmonic valve was grossly normal. Pulmonic valve regurgitation is trivial. Aorta: The aortic root and ascending aorta are structurally normal, with no evidence of dilitation. Venous: The inferior vena cava is dilated in size with less than 50% respiratory variability, suggesting right atrial pressure of 15 mmHg. IAS/Shunts: No atrial level shunt detected by color flow Doppler.  LEFT VENTRICLE PLAX 2D                        Biplane EF (MOD) LVIDd:         5.10 cm         LV Biplane EF:   Left LVIDs:         4.40 cm                          ventricular LV PW:         1.10 cm                          ejection LV IVS:        1.10 cm                          fraction by LVOT diam:     1.70  cm                          2D MOD LV SV:         38                               biplane is LV SV Index:   19                               26.8 %. LVOT Area:     2.27 cm                                Diastology                                LV e' medial:    4.24 cm/s LV Volumes (MOD)               LV E/e' medial:  25.7 LV vol d, MOD    195.5 ml      LV e' lateral:   5.22 cm/s A2C:                            LV E/e' lateral: 20.9 LV vol d, MOD    153.1 ml A4C: LV vol s, MOD    145.0 ml A2C: LV vol s, MOD    128.6 ml A4C: LV SV MOD A2C:   50.5 ml LV SV MOD A4C:   153.1 ml LV SV MOD BP:    47.8 ml RIGHT VENTRICLE            IVC RV Basal diam:  2.90 cm    IVC diam: 2.60 cm RV S prime:     9.57 cm/s TAPSE (M-mode): 0.8 cm LEFT ATRIUM             Index        RIGHT ATRIUM           Index LA diam:        4.00 cm 2.04 cm/m   RA Area:     15.80 cm LA Vol (A2C):   70.5 ml 36.04 ml/m  RA Volume:   42.70 ml  21.83 ml/m LA Vol (A4C):   59.3 ml 30.31 ml/m LA Biplane Vol: 70.2 ml 35.89 ml/m  AORTIC VALVE AV Area (Vmax):    1.02 cm AV Area (Vmean):   1.02 cm AV Area (VTI):     1.03 cm AV Vmax:           205.50 cm/s AV Vmean:          134.000 cm/s AV VTI:            0.364 m AV Peak Grad:      16.9 mmHg AV Mean Grad:      8.5 mmHg LVOT Vmax:         92.20 cm/s LVOT Vmean:        60.450 cm/s LVOT VTI:          0.166 m LVOT/AV VTI ratio: 0.45  AORTA Ao Root diam: 3.30 cm Ao Asc diam:  3.10 cm MITRAL VALVE  TRICUSPID VALVE MV Area (PHT): 5.66 cm     TR Peak grad:   42.0 mmHg MV Area VTI:   1.42 cm     TR Vmax:        324.00 cm/s MV Peak grad:  6.4 mmHg MV Mean grad:  3.0 mmHg     SHUNTS MV Vmax:       1.26 m/s     Systemic VTI:  0.17 m MV Vmean:      87.4 cm/s    Systemic Diam: 1.70 cm MV Decel Time: 134 msec MV E velocity: 109.00 cm/s MV A velocity: 85.90 cm/s MV E/A ratio:  1.27 Lyman Bishop MD Electronically signed by Lyman Bishop MD Signature Date/Time: 05/28/2022/6:48:53 PM    Final    US RENAL  Result Date: 05/27/2022 CLINICAL DATA:  Acute kidney injury EXAM: RENAL / URINARY TRACT ULTRASOUND COMPLETE COMPARISON:  CT 11/16/2021 FINDINGS: Right Kidney: Renal measurements: 10.1 x 5.1 x 5.6 cm = volume: 149 mL. Echogenicity within normal limits. No mass or hydronephrosis visualized. Left Kidney: Renal measurements: 10.9 x 5.8 x 5.5 cm = volume: 183 mL. Echogenicity within normal limits. No mass or  hydronephrosis visualized. Bladder: Appears normal for degree of bladder distention. Other: None. IMPRESSION: Unremarkable sonographic appearance of the kidneys. Electronically Signed   By: Davina Poke D.O.   On: 05/27/2022 18:33   DG Chest 2 View  Result Date: 05/27/2022 CLINICAL DATA:  Shortness of breath EXAM: CHEST - 2 VIEW COMPARISON:  01/07/2022 FINDINGS: Mild cardiomegaly. TAVR. Pulmonary vascular congestion with mild diffuse interstitial prominence. No pleural effusion. No pneumothorax. IMPRESSION: Mild cardiomegaly with pulmonary vascular congestion and mild diffuse interstitial prominence, suggesting CHF with mild interstitial edema. Electronically Signed   By: Davina Poke D.O.   On: 05/27/2022 16:02   CT ANGIO HEAD W OR WO CONTRAST  Result Date: 05/16/2022 CLINICAL DATA:  Carotid stenosis. EXAM: CT ANGIOGRAPHY HEAD AND NECK TECHNIQUE: Multidetector CT imaging of the head and neck was performed using the standard protocol during bolus administration of intravenous contrast. Multiplanar CT image reconstructions and MIPs were obtained to evaluate the vascular anatomy. Carotid stenosis measurements (when applicable) are obtained utilizing NASCET criteria, using the distal internal carotid diameter as the denominator. RADIATION DOSE REDUCTION: This exam was performed according to the departmental dose-optimization program which includes automated exposure control, adjustment of the mA and/or kV according to patient size and/or use of iterative reconstruction technique. CONTRAST:  61mL OMNIPAQUE IOHEXOL 350 MG/ML SOLN COMPARISON:  11/13/2019 FINDINGS: CT HEAD FINDINGS Brain: Brain does not show advanced atrophy for age. Minimal chronic small-vessel ischemic changes affect the hemispheric white matter. No sign of acute infarction. No large vessel territory infarction. No mass, hemorrhage, hydrocephalus or extra-axial collection. Vascular: There is atherosclerotic calcification of the major  vessels at the base of the brain. Skull: Negative Sinuses/Orbits: Clear/normal Other: None Review of the MIP images confirms the above findings CTA NECK FINDINGS Aortic arch: Aortic atherosclerosis. The branching pattern is normal. No brachiocephalic vessel origin stenosis. Right carotid system: Common carotid artery is tortuous but widely patent to the bifurcation. There is a carotid artery stent extending from the distal common carotid artery, through the carotid bifurcation and through the ICA bulb region. Calcified plaque at the bifurcation and proximal ICA again demonstrated. There is waisting of the stent at the proximal ICA, with minimal diameter of 2.6 mm. There appears to be a small amount of intimal thickening in this location. More distal cervical ICA diameter is 3.5  mm, indicating stenosis of 25%. Left carotid system: Common carotid artery widely patent to the distal extent. Carotid artery stent extending from the distal common carotid artery, through the bifurcation and through the ICA bulb region. There is severe narrowing of the lumen at the proximal ICA level, diameter only 0.8-1 mm. The distal ICA measures only 3.2 mm, probably small because of in flow reduction. This recurrent stenosis is probably functionally on the order 80%. Vertebral arteries: Both vertebral artery origins are widely patent. Both vertebral arteries appear normal through the cervical region to the foramen magnum. Skeleton: Ordinary degenerative spondylosis. Other neck: No mass or lymphadenopathy. Upper chest: Lung apices are clear. Review of the MIP images confirms the above findings CTA HEAD FINDINGS Anterior circulation: Both internal carotid arteries are patent through the skull base and siphon regions. Mild siphon atherosclerotic calcification but no stenosis greater than 30%. The anterior and middle cerebral vessels are patent. No large vessel occlusion or proximal stenosis. No aneurysm or vascular malformation. Posterior  circulation: Both vertebral arteries are patent through the foramen magnum to the basilar artery. No basilar stenosis. Superior cerebellar and posterior cerebral arteries are patent. There are patent posterior communicating arteries. Venous sinuses: Patent and normal. Anatomic variants: None significant. Review of the MIP images confirms the above findings IMPRESSION: 1. Right carotid artery stent extending from the distal common carotid artery, through the carotid bifurcation and through the ICA bulb region. There is waisting of the stent at the proximal ICA level with mild soft plaque/intimal thickening and minimal diameter of 2.6 mm. More distal cervical ICA diameter is 3.5 mm, indicating stenosis of 25%. 2. Left carotid artery stent extending from the distal common carotid artery, through the bifurcation and through the ICA bulb region. There is severe narrowing of the lumen at the proximal ICA level by soft plaque, diameter only 0.8-1 mm. The distal ICA measures only 3.2 mm, probably small because of inflow reduction. This recurrent stenosis is probably functionally on the order of 80%. 3. No intracranial large vessel occlusion or proximal stenosis. Aortic Atherosclerosis (ICD10-I70.0). Electronically Signed   By: Nelson Chimes M.D.   On: 05/16/2022 08:48   CT ANGIO NECK W OR WO CONTRAST  Result Date: 05/16/2022 CLINICAL DATA:  Carotid stenosis. EXAM: CT ANGIOGRAPHY HEAD AND NECK TECHNIQUE: Multidetector CT imaging of the head and neck was performed using the standard protocol during bolus administration of intravenous contrast. Multiplanar CT image reconstructions and MIPs were obtained to evaluate the vascular anatomy. Carotid stenosis measurements (when applicable) are obtained utilizing NASCET criteria, using the distal internal carotid diameter as the denominator. RADIATION DOSE REDUCTION: This exam was performed according to the departmental dose-optimization program which includes automated exposure  control, adjustment of the mA and/or kV according to patient size and/or use of iterative reconstruction technique. CONTRAST:  59mL OMNIPAQUE IOHEXOL 350 MG/ML SOLN COMPARISON:  11/13/2019 FINDINGS: CT HEAD FINDINGS Brain: Brain does not show advanced atrophy for age. Minimal chronic small-vessel ischemic changes affect the hemispheric white matter. No sign of acute infarction. No large vessel territory infarction. No mass, hemorrhage, hydrocephalus or extra-axial collection. Vascular: There is atherosclerotic calcification of the major vessels at the base of the brain. Skull: Negative Sinuses/Orbits: Clear/normal Other: None Review of the MIP images confirms the above findings CTA NECK FINDINGS Aortic arch: Aortic atherosclerosis. The branching pattern is normal. No brachiocephalic vessel origin stenosis. Right carotid system: Common carotid artery is tortuous but widely patent to the bifurcation. There is a carotid artery stent extending  from the distal common carotid artery, through the carotid bifurcation and through the ICA bulb region. Calcified plaque at the bifurcation and proximal ICA again demonstrated. There is waisting of the stent at the proximal ICA, with minimal diameter of 2.6 mm. There appears to be a small amount of intimal thickening in this location. More distal cervical ICA diameter is 3.5 mm, indicating stenosis of 25%. Left carotid system: Common carotid artery widely patent to the distal extent. Carotid artery stent extending from the distal common carotid artery, through the bifurcation and through the ICA bulb region. There is severe narrowing of the lumen at the proximal ICA level, diameter only 0.8-1 mm. The distal ICA measures only 3.2 mm, probably small because of in flow reduction. This recurrent stenosis is probably functionally on the order 80%. Vertebral arteries: Both vertebral artery origins are widely patent. Both vertebral arteries appear normal through the cervical region to  the foramen magnum. Skeleton: Ordinary degenerative spondylosis. Other neck: No mass or lymphadenopathy. Upper chest: Lung apices are clear. Review of the MIP images confirms the above findings CTA HEAD FINDINGS Anterior circulation: Both internal carotid arteries are patent through the skull base and siphon regions. Mild siphon atherosclerotic calcification but no stenosis greater than 30%. The anterior and middle cerebral vessels are patent. No large vessel occlusion or proximal stenosis. No aneurysm or vascular malformation. Posterior circulation: Both vertebral arteries are patent through the foramen magnum to the basilar artery. No basilar stenosis. Superior cerebellar and posterior cerebral arteries are patent. There are patent posterior communicating arteries. Venous sinuses: Patent and normal. Anatomic variants: None significant. Review of the MIP images confirms the above findings IMPRESSION: 1. Right carotid artery stent extending from the distal common carotid artery, through the carotid bifurcation and through the ICA bulb region. There is waisting of the stent at the proximal ICA level with mild soft plaque/intimal thickening and minimal diameter of 2.6 mm. More distal cervical ICA diameter is 3.5 mm, indicating stenosis of 25%. 2. Left carotid artery stent extending from the distal common carotid artery, through the bifurcation and through the ICA bulb region. There is severe narrowing of the lumen at the proximal ICA level by soft plaque, diameter only 0.8-1 mm. The distal ICA measures only 3.2 mm, probably small because of inflow reduction. This recurrent stenosis is probably functionally on the order of 80%. 3. No intracranial large vessel occlusion or proximal stenosis. Aortic Atherosclerosis (ICD10-I70.0). Electronically Signed   By: Nelson Chimes M.D.   On: 05/16/2022 08:48   DG Foot Complete Right  Result Date: 05/12/2022 CLINICAL DATA:  Diabetic foot ulcer to assess for possible osteo. Right  heel pain. EXAM: RIGHT FOOT COMPLETE - 3+ VIEW COMPARISON:  09/17/2019 FINDINGS: Degenerative changes in the interphalangeal joints, first metatarsal-phalangeal joint, and intertarsal joints. Small plantar calcaneal spur. No evidence of acute fracture or dislocation. No focal bone lesions. Soft tissue defect over the posterior calcaneus corresponding to the history of ulceration. No underlying bone changes to suggest osteomyelitis. Prominent vascular calcifications. IMPRESSION: Degenerative changes in the right foot. Soft tissue ulceration over the posterior calcaneus. No radiographic evidence of osteomyelitis. Electronically Signed   By: Lucienne Capers M.D.   On: 05/12/2022 17:46     I spent 80 minutes for this patient encounter including review of prior medical records/discussing diagnostics and treatment plan with the patient/family/coordinate care with primary/other specialits with greater than 50% of time in face to face encounter.   Electronically signed by:   Rosiland Oz, MD  Infectious Disease Physician [facility] Pager: [phone]

## 2022-06-08 NOTE — Inpatient Diabetes Management (Signed)
Inpatient Diabetes Program Recommendations  AACE/ADA: New Consensus Statement on Inpatient Glycemic Control (2015)  Target Ranges:  Prepandial:   less than 140 mg/dL      Peak postprandial:   less than 180 mg/dL (1-2 hours)      Critically ill patients:  140 - 180 mg/dL   Lab Results  Component Value Date   GLUCAP 297 (H) 06/08/2022   HGBA1C 7.9 (A) 04/25/2022    Review of Glycemic Control  Diabetes history: DM2 Outpatient Diabetes medications: 70/30 8 units in am and 7 units QPM Current orders for Inpatient glycemic control: 70/30 5 units BID, Novolog 0-15 TID with meals and 0-5 HS  HgbA1C - 7.9%  Inpatient Diabetes Program Recommendations:    Consider increasing 70/30 to home dose of 8 units in am and 7 units before dinner  Continue to follow.  Thank you. Lorenda Peck, RD, LDN, [place] Inpatient Diabetes Coordinator [phone]

## 2022-06-08 NOTE — Consult Note (Signed)
WOC consult: The [place] team was consulted for this patient's right foot wound.  Secure chat message sent to the primary team as follows: According to the MRI, " Acute osteomyelitis of the posterolateral aspect of the calcaneus." This complex medical condition is beyond the scope of practice for [facility] nurses. Please consult to ortho service for further plan of care.   Please re-consult if further assistance is needed.  Thank-you,  Julien Girt MSN, [place], [place], [place], [place]

## 2022-06-08 NOTE — Progress Notes (Signed)
Pharmacy Antibiotic Note  Caleb [name] is a 72 y.o. male admitted on 06/07/2022 with  R heel osteomyelitis  .  Pharmacy has been consulted for daptomycin dosing.  Pt with AKI and Scr 3.50 mg/dL, CrCl 17.8 (baseline 1.1-1.2 mg/dL in Dec 2023) likely due to ibuprofen overuse. Will require every other day dosing of daptomycin until CrCl > 30 mL/min.   Pt takes atorvastatin 40 mg PO daily for ASCVD secondary prevention s/p recent TAVR (Oct 023) and PAD. Continue statin with daptomycin and monitor CK closely.   Plan: Start daptomycin 600 mg (~8 mg/kg) q48h @1200 -1400 on 3/20 Monitor CK at baseline and weekly Trend Scr/CrCl and modify dosing regimen as AKI resolves    Temp (24hrs), Avg:97.6 F (36.4 C), Min:97.4 F (36.3 C), Max:97.9 F (36.6 C)  Recent Labs  Lab 06/07/22 1218 06/07/22 1421 06/07/22 1844 06/08/22 0541 06/08/22 0828  WBC 13.9*  --   --  14.6*  --   CREATININE 2.97*  --   --  3.63* 3.50*  LATICACIDVEN 2.1* 2.5* 3.0*  --   --     Estimated Creatinine Clearance: 17.8 mL/min (A) (by C-G formula based on SCr of 3.5 mg/dL (H)).    Allergies  Allergen Reactions   Codeine Nausea And Vomiting   Norco [Hydrocodone-Acetaminophen] Nausea And Vomiting   Percocet [Oxycodone-Acetaminophen] Nausea And Vomiting   Tramadol Hcl Nausea And Vomiting    Antimicrobials this admission: Cefepime 3/19 >> 3/19  Linezolid 3/19 >> 3/19 Ceftriaxone 3/20 >> Daptomycin 3/20 >>  Microbiology results: 3/20 BCx: pending (*after abx*)  Thank you for allowing pharmacy to be a part of this patient's care.  Park Liter, PharmD Candidate 06/08/2022 9:19 AM

## 2022-06-09 DIAGNOSIS — R6510 Systemic inflammatory response syndrome (SIRS) of non-infectious origin without acute organ dysfunction: Secondary | ICD-10-CM | POA: Diagnosis not present

## 2022-06-09 DIAGNOSIS — E1169 Type 2 diabetes mellitus with other specified complication: Secondary | ICD-10-CM

## 2022-06-09 DIAGNOSIS — Z794 Long term (current) use of insulin: Secondary | ICD-10-CM

## 2022-06-09 DIAGNOSIS — E11621 Type 2 diabetes mellitus with foot ulcer: Secondary | ICD-10-CM | POA: Diagnosis not present

## 2022-06-09 DIAGNOSIS — E1151 Type 2 diabetes mellitus with diabetic peripheral angiopathy without gangrene: Secondary | ICD-10-CM

## 2022-06-09 DIAGNOSIS — E11628 Type 2 diabetes mellitus with other skin complications: Secondary | ICD-10-CM | POA: Diagnosis not present

## 2022-06-09 DIAGNOSIS — M86171 Other acute osteomyelitis, right ankle and foot: Secondary | ICD-10-CM

## 2022-06-09 DIAGNOSIS — L97418 Non-pressure chronic ulcer of right heel and midfoot with other specified severity: Secondary | ICD-10-CM

## 2022-06-09 DIAGNOSIS — L089 Local infection of the skin and subcutaneous tissue, unspecified: Secondary | ICD-10-CM

## 2022-06-09 LAB — CBC
HCT: 40 % (ref 39.0–52.0)
Hemoglobin: 12.8 g/dL — ABNORMAL LOW (ref 13.0–17.0)
MCH: 26.3 pg (ref 26.0–34.0)
MCHC: 32 g/dL (ref 30.0–36.0)
MCV: 82.1 fL (ref 80.0–100.0)
Platelets: 272 10*3/uL (ref 150–400)
RBC: 4.87 MIL/uL (ref 4.22–5.81)
RDW: 15.3 % (ref 11.5–15.5)
WBC: 12.8 10*3/uL — ABNORMAL HIGH (ref 4.0–10.5)
nRBC: 0 % (ref 0.0–0.2)

## 2022-06-09 LAB — BASIC METABOLIC PANEL
Anion gap: 14 (ref 5–15)
BUN: 80 mg/dL — ABNORMAL HIGH (ref 8–23)
CO2: 25 mmol/L (ref 22–32)
Calcium: 8.5 mg/dL — ABNORMAL LOW (ref 8.9–10.3)
Chloride: 90 mmol/L — ABNORMAL LOW (ref 98–111)
Creatinine, Ser: 3.07 mg/dL — ABNORMAL HIGH (ref 0.61–1.24)
GFR, Estimated: 21 mL/min — ABNORMAL LOW (ref >60)
Glucose, Bld: 109 mg/dL — ABNORMAL HIGH (ref 70–99)
Potassium: 4.8 mmol/L (ref 3.5–5.1)
Sodium: 129 mmol/L — ABNORMAL LOW (ref 135–145)

## 2022-06-09 LAB — SEDIMENTATION RATE: Sed Rate: 92 mm/hr — ABNORMAL HIGH (ref 0–16)

## 2022-06-09 LAB — GLUCOSE, CAPILLARY
Glucose-Capillary: 81 mg/dL (ref 70–99)
Glucose-Capillary: 224 mg/dL — ABNORMAL HIGH (ref 70–99)
Glucose-Capillary: 217 mg/dL — ABNORMAL HIGH (ref 70–99)
Glucose-Capillary: 150 mg/dL — ABNORMAL HIGH (ref 70–99)

## 2022-06-09 LAB — C-REACTIVE PROTEIN: CRP: 14.2 mg/dL — ABNORMAL HIGH (ref <1.0)

## 2022-06-09 LAB — CK: Total CK: 54 U/L (ref 49–397)

## 2022-06-09 MED ORDER — DOXYCYCLINE HYCLATE 100 MG PO TABS
100.0000 mg | ORAL_TABLET | Freq: Two times a day (BID) | ORAL | Status: DC
  Administered 2022-06-09 – 2022-06-16 (×15): 100 mg via ORAL
  Filled 2022-06-09 (×15): qty 1

## 2022-06-09 MED ORDER — INSULIN ASPART PROT & ASPART (70-30 MIX) 100 UNIT/ML ~~LOC~~ SUSP
5.0000 [IU] | Freq: Two times a day (BID) | SUBCUTANEOUS | Status: DC
  Administered 2022-06-09 – 2022-06-10 (×2): 5 [IU] via SUBCUTANEOUS
  Filled 2022-06-09: qty 10

## 2022-06-09 MED ORDER — AMOXICILLIN-POT CLAVULANATE 500-125 MG PO TABS
1.0000 | ORAL_TABLET | Freq: Two times a day (BID) | ORAL | Status: DC
  Administered 2022-06-09 – 2022-06-20 (×23): 1 via ORAL
  Filled 2022-06-09 (×25): qty 1

## 2022-06-09 MED ORDER — INSULIN GLARGINE-YFGN 100 UNIT/ML ~~LOC~~ SOLN
6.0000 [IU] | Freq: Every day | SUBCUTANEOUS | Status: DC
  Administered 2022-06-09: 6 [IU] via SUBCUTANEOUS
  Filled 2022-06-09 (×2): qty 0.06

## 2022-06-09 MED ORDER — HYDROCODONE-ACETAMINOPHEN 5-325 MG PO TABS
1.0000 | ORAL_TABLET | Freq: Four times a day (QID) | ORAL | Status: DC | PRN
  Administered 2022-06-09 – 2022-06-11 (×6): 1 via ORAL
  Administered 2022-06-11: 2 via ORAL
  Administered 2022-06-12: 1 via ORAL
  Administered 2022-06-12: 2 via ORAL
  Administered 2022-06-12: 1 via ORAL
  Administered 2022-06-13 – 2022-06-17 (×13): 2 via ORAL
  Administered 2022-06-17: 1 via ORAL
  Administered 2022-06-18 – 2022-06-21 (×11): 2 via ORAL
  Administered 2022-06-21: 1 via ORAL
  Administered 2022-06-22 – 2022-06-30 (×13): 2 via ORAL
  Filled 2022-06-09: qty 1
  Filled 2022-06-09 (×3): qty 2
  Filled 2022-06-09: qty 1
  Filled 2022-06-09 (×5): qty 2
  Filled 2022-06-09 (×2): qty 1
  Filled 2022-06-09 (×14): qty 2
  Filled 2022-06-09: qty 1
  Filled 2022-06-09 (×3): qty 2
  Filled 2022-06-09 (×3): qty 1
  Filled 2022-06-09 (×10): qty 2
  Filled 2022-06-09: qty 1
  Filled 2022-06-09: qty 2
  Filled 2022-06-09 (×2): qty 1
  Filled 2022-06-09 (×2): qty 2
  Filled 2022-06-09: qty 1
  Filled 2022-06-09 (×5): qty 2

## 2022-06-09 NOTE — Progress Notes (Signed)
[facility]  Date of Admission:  06/07/2022       Abx: Dapto ceftriaxone  ASSESSMENT: 72 Y O male with PMH as below including type II DM with neuropathy s/p partial left lateral forefoot amputation, CHFrEF, PVD s/p peripheral vascular intervention, Carotid artery occlusion s/p trans-carotid artery revascularization, severe aortic stenosis status post TAVR in 12/2021 presented to the ED from home with increasing pain from Rt ulcer and foul-smelling discharge for the last few days. Admitted with   Calcaneal chronic om/cellulitis PAD Dm  Aki on ckd  Mri right heel suggest acute om of the heel  Patient wants to remain in hospital until surgical management of his calcaneal chronic OM Dr Sharol Given evaluated and would like vascular evaluation/optimization, and plans partial calcaneous debridement next week  He no longer has pain in the right heel. There is on exam today no cellulitis evidence.   PLAN: Reviewed dr Jess Barters note Patient never had bacteremia and diabetic foot infection does just as well with oral abx, so will switch to doxy/augmentin while waiting for surgery. Abx started at admission for concern of sepsis, so will keep going Plan to do 4 weeks abx starting after calcaneal I&D Discussed with primary team  Principal Problem:   SIRS (systemic inflammatory response syndrome) (HCC) Active Problems:   HTN (hypertension)   Hyperlipidemia   Hyponatremia   Dyslipidemia   Type 2 diabetes mellitus with hyperlipidemia (HCC)   PAD (peripheral artery disease) (HCC)   S/P TAVR (transcatheter aortic valve replacement)   Decubitus ulcer of right heel   Acute osteomyelitis of right calcaneus (HCC)   Diabetic foot ulcer (HCC)   Allergies  Allergen Reactions   Codeine Nausea And Vomiting   Norco [Hydrocodone-Acetaminophen] Nausea And Vomiting   Percocet [Oxycodone-Acetaminophen] Nausea And Vomiting   Tramadol Hcl Nausea And Vomiting     Scheduled Meds:  aspirin  81 mg Oral Daily   atorvastatin  40 mg Oral Daily   carvedilol  12.5 mg Oral BID WC   clopidogrel  75 mg Oral Daily   docusate sodium  100 mg Oral BID   ferrous sulfate  325 mg Oral Q breakfast   heparin  5,000 Units Subcutaneous Q8H   hydrALAZINE  25 mg Oral TID   insulin aspart  0-15 Units Subcutaneous TID WC   insulin aspart  0-5 Units Subcutaneous QHS   insulin aspart protamine- aspart  8 Units Subcutaneous BID WC   Continuous Infusions:  sodium chloride 75 mL/hr at 06/09/22 1023   cefTRIAXone (ROCEPHIN)  IV 2 g (06/08/22 1529)   DAPTOmycin (CUBICIN) 600 mg in sodium chloride 0.9 % IVPB 600 mg (06/08/22 1212)   PRN Meds:.HYDROcodone-acetaminophen, ipratropium-albuterol, LORazepam, ondansetron **OR** ondansetron (ZOFRAN) IV, polyethylene glycol, traZODone   SUBJECTIVE: Felt well No pain in right heel No n/v/diarrhea Wants to stay until foot taken care off   Review of Systems: ROS All other ROS was negative, except mentioned above     OBJECTIVE: Vitals:   06/08/22 2304 06/09/22 0323 06/09/22 0743 06/09/22 0909  BP: 105/80 (!) 95/58 (!) 138/102 111/84  Pulse: 65 (!) 58 81   Resp: 14 17 15    Temp: 97.7 F (36.5 C) 97.7 F (36.5 C) 97.9 F (36.6 C)   TempSrc: Oral Oral Oral   SpO2: 100% 97% 100%    There is no height or weight on file to calculate BMI.  Physical Exam General/constitutional: no distress, pleasant HEENT: Normocephalic, PER, Conj  Clear, EOMI, Oropharynx clear Neck supple CV: rrr no mrg Lungs: clear to auscultation, normal respiratory effort Abd: Soft, Nontender Ext: no edema Skin/msk: right heel dressing without strike through; no tenderness on palpation; no cellulitis change visualized   Lab Results Lab Results  Component Value Date   WBC 12.8 (H) 06/09/2022   HGB 12.8 (L) 06/09/2022   HCT 40.0 06/09/2022   MCV 82.1 06/09/2022   PLT 272 06/09/2022    Lab Results  Component Value Date   CREATININE  3.07 (H) 06/09/2022   BUN 80 (H) 06/09/2022   NA 129 (L) 06/09/2022   K 4.8 06/09/2022   CL 90 (L) 06/09/2022   CO2 25 06/09/2022    Lab Results  Component Value Date   ALT 34 05/31/2022   AST 28 05/31/2022   ALKPHOS 124 05/31/2022   BILITOT 0.4 05/31/2022      Microbiology: Recent Results (from the past 240 hour(s))  Culture, blood (Routine X 2) w Reflex to ID Panel     Status: None (Preliminary result)   Collection Time: 06/08/22  8:28 AM   Specimen: BLOOD  Result Value Ref Range Status   Specimen Description   Final    BLOOD RIGHT ANTECUBITAL Performed at [facility], [place] [address], [place], [place] [address]    Special Requests   Final    BOTTLES DRAWN AEROBIC ONLY Blood Culture adequate volume Performed at [facility] [address] [place], [place] [address]    Culture   Final    NO GROWTH < 24 HOURS Performed at [facility] [address] [place], [place] [address]    Report Status PENDING  Incomplete  Culture, blood (Routine X 2) w Reflex to ID Panel     Status: None (Preliminary result)   Collection Time: 06/08/22  8:28 AM   Specimen: BLOOD RIGHT HAND  Result Value Ref Range Status   Specimen Description   Final    BLOOD RIGHT HAND Performed at [facility] [address], [place], [place] [address]    Special Requests   Final    BOTTLES DRAWN AEROBIC ONLY Blood Culture adequate volume Performed at [facility] [address] [place], [place] [address]    Culture   Final    NO GROWTH < 24 HOURS Performed at [facility] [address], [place],  [address]    Report Status PENDING  Incomplete     Serology:   Imaging: If present, new imagings (plain films, ct scans, and mri) have been personally visualized and interpreted; radiology reports have been reviewed. Decision making incorporated into the Impression / Recommendations.  3/19 mri right  foot 1. Acute osteomyelitis of the posterolateral aspect of the calcaneus. 2. Increased intramuscular signal of the flexor hallucis longus and plantar muscles of the foot suggesting diabetic myopathy/myositis. 3. Deep skin wound with subcutaneous edema about the posterolateral aspect of the heel suggesting chronic diabetic ulcer. No fluid collection or abscess.    Jabier Mutton, [facility] [phone] pager    06/09/2022, 11:14 AM

## 2022-06-09 NOTE — Progress Notes (Signed)
PROGRESS NOTE    Caleb [name]  [id number]  DOB: 01-08-1951  DOA: 06/07/2022 PCP: [name], MD Outpatient Specialists:   Hospital course:  72 year old man with PVD, DM 2, HTN, AAS s/p TAVR, HFrEF with a EF of 35% send CKD 3 was admitted with recurrent diabetic foot infection and acute on chronic kidney injury.  Workup has revealed acute osteomyelitis in the posterolateral aspect of the calcaneus.  Patient is being treated with gentle IV hydration at 75 cc an hour and doxycycline/Augmentin pending surgery/bone biopsy per ID recommendations.  Patient is being seen by vascular surgery who would like to do an angiogram however we are waiting improvement in creatinine prior to proceeding with dye load needed for the angiogram.   Subjective:  Patient states he is doing well, understands that we are waiting improvement in kidney function prior to proceeding with angiogram.  He has no abdominal pain nausea or vomiting.   Objective: Vitals:   06/09/22 0323 06/09/22 0743 06/09/22 0909 06/09/22 1112  BP: (!) 95/58 (!) 138/102 111/84 (!) 121/95  Pulse: (!) 58 81  65  Resp: 17 15  16   Temp: 97.7 F (36.5 C) 97.9 F (36.6 C)  (!) 97.5 F (36.4 C)  TempSrc: Oral Oral  Oral  SpO2: 97% 100%  98%    Intake/Output Summary (Last 24 hours) at 06/09/2022 1525 Last data filed at 06/09/2022 1500 Gross per 24 hour  Intake 408.37 ml  Output 960 ml  Net -551.63 ml   There were no vitals filed for this visit.   Exam:  General: Well-appearing man sitting up in bed watching TV in NAD Eyes: sclera anicteric, conjuctiva mild injection bilaterally CVS: S1-S2, regular  Respiratory:  decreased air entry bilaterally secondary to decreased inspiratory effort, rales at bases  GI: NABS, soft, NT  Neuro: A/O x 3,  grossly nonfocal.  Psych: patient is logical and coherent, judgement and insight appear normal, mood and affect appropriate to situation.  Data Reviewed:  Basic Metabolic  Panel: Recent Labs  Lab 06/07/22 1218 06/08/22 0541 06/08/22 0828 06/08/22 1148 06/08/22 1520 06/09/22 0101  NA 128* 129* 126*  --   --  129*  K 5.5* 6.6* 5.1 4.6 4.3 4.8  CL 94* 93* 93*  --   --  90*  CO2 20* 24 22  --   --  25  GLUCOSE 270* 327* 293*  --   --  109*  BUN 75* 86* 84*  --   --  80*  CREATININE 2.97* 3.63* 3.50*  --   --  3.07*  CALCIUM 8.7* 8.9 8.7*  --   --  8.5*    CBC: Recent Labs  Lab 06/07/22 1218 06/08/22 0541 06/09/22 0101  WBC 13.9* 14.6* 12.8*  HGB 13.7 13.1 12.8*  HCT 41.6 41.8 40.0  MCV 81.3 83.9 82.1  PLT 267 262 272     Scheduled Meds:  amoxicillin-clavulanate  1 tablet Oral BID   aspirin  81 mg Oral Daily   atorvastatin  40 mg Oral Daily   carvedilol  12.5 mg Oral BID WC   clopidogrel  75 mg Oral Daily   docusate sodium  100 mg Oral BID   doxycycline  100 mg Oral Q12H   ferrous sulfate  325 mg Oral Q breakfast   heparin  5,000 Units Subcutaneous Q8H   hydrALAZINE  25 mg Oral TID   insulin aspart  0-15 Units Subcutaneous TID WC   insulin aspart  0-5 Units Subcutaneous  QHS   insulin aspart protamine- aspart  8 Units Subcutaneous BID WC   Continuous Infusions:  sodium chloride 75 mL/hr at 06/09/22 1500     Assessment & Plan:   Diabetic foot infection Right heel osteomyelitis Peripheral vascular disease Continue doxycycline and Augmentin to prevent sepsis per ID recommendations Bone biopsy to be done per vascular surgery Await improvement of kidney function prior to proceeding with angiogram for possible stenting  Acute on chronic kidney injury HFpEF with EF around 35% AAS s/p TAVR Creatinine is slowly improving with IV hydration, down to 3 today Baseline creatinine appears to be around 2-2.5 Appreciate ongoing nephrology consultation Will need to follow lung exam closely with hydration given known EF of 25 to 30% Home Lasix 80 mg daily is being held. Follows with Dr. Alvester Chou as an outpatient Continue hydralazine and  carvedilol  DM2 Blood sugars are not under optimal control on present regimen Add glargine 6 units daily today Decrease preprandial insulin to 5 units from 8 units Continue SSI coverage Will place consult for diabetes coordinator Continue gabapentin   DVT prophylaxis: Subcu heparin Code Status: Full Family Communication: None today Disposition Plan: TBD  Studies: MR FOOT RIGHT WO CONTRAST  Result Date: 06/07/2022 CLINICAL DATA:  Increasing right heel pain from ulcer for 1 month. Diabetic peripheral neuropathy EXAM: MRI OF THE RIGHT FOOT WITHOUT CONTRAST TECHNIQUE: Multiplanar, multisequence MR imaging of the right hindfoot was performed. No intravenous contrast was administered. COMPARISON:  RADIOGRAPHS DATED June 07, 2022 FINDINGS: Bones/Joint/Cartilage There is cortical erosion and marrow edema of the posterolateral aspect of the calcaneus consistent with osteomyelitis. There is heterogeneous signal of the talar dome which is likely sequela of chronic osteochondral injury, likely a chronic process. No evidence of acute edema. Ligaments Ligaments of the lateral and medial compartment of the ankle appear intact. Muscles and Tendons Increased intramuscular signal of the flexor hallucis longus and plantar muscles of the foot suggesting diabetic myopathy/myositis. Soft tissues Deep skin wound with subcutaneous edema about the posterolateral aspect of the heel suggesting chronic diabetic ulcer. No fluid collection or abscess. Mild generalized subcutaneous soft tissue edema about the ankle and foot. IMPRESSION: 1. Acute osteomyelitis of the posterolateral aspect of the calcaneus. 2. Increased intramuscular signal of the flexor hallucis longus and plantar muscles of the foot suggesting diabetic myopathy/myositis. 3. Deep skin wound with subcutaneous edema about the posterolateral aspect of the heel suggesting chronic diabetic ulcer. No fluid collection or abscess. Electronically Signed   By: Keane Police D.O.   On: 06/07/2022 22:05    Principal Problem:   SIRS (systemic inflammatory response syndrome) (HCC) Active Problems:   Type 2 diabetes mellitus with hyperlipidemia (HCC)   HTN (hypertension)   Hyperlipidemia   Hyponatremia   Dyslipidemia   PAD (peripheral artery disease) (HCC)   S/P TAVR (transcatheter aortic valve replacement)   Decubitus ulcer of right heel   Acute osteomyelitis of right calcaneus ([facility])   Diabetic foot ulcer ([facility])     Caleb [name], [facility]  If 7PM-7AM, please contact night-coverage [email or web address]   LOS: 2 days

## 2022-06-09 NOTE — Consult Note (Signed)
ORTHOPAEDIC CONSULTATION  REQUESTING PHYSICIAN: Oren Binet*  Chief Complaint: Painful right heel ulcer.  HPI: Caleb [name] is a 72 y.o. male who presents with 3-week history of ischemic painful right heel ulcer with a cold foot.  Past Medical History:  Diagnosis Date   Carotid artery occlusion    Diabetes mellitus without complication ([place])    Type II   Dyslipidemia 09/27/2012   Erectile dysfunction 09/27/2012   GERD (gastroesophageal reflux disease)    Hypercholesteremia    Hyperlipidemia 08/12/2012   Hypertension    Hyponatremia 08/14/2012   Neuropathy    Pancreatitis    Pancreatitis, acute 09/27/2012   Peripheral vascular disease ([place])    S/P 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 Burt Knack & Bartle   Severe aortic stenosis    Vitamin D deficiency 06/23/2015   Past Surgical History:  Procedure Laterality Date   ABDOMINAL AORTOGRAM W/LOWER EXTREMITY N/A 08/08/2019   Procedure: ABDOMINAL AORTOGRAM W/LOWER EXTREMITY;  Surgeon: Lorretta Harp, MD;  Location: [facility];  Service: Cardiovascular;  Laterality: N/A;   ABDOMINAL AORTOGRAM W/LOWER EXTREMITY N/A 11/18/2019   Procedure: ABDOMINAL AORTOGRAM W/LOWER EXTREMITY;  Surgeon: Lorretta Harp, MD;  Location: [facility] [facility];  Service: Cardiovascular;  Laterality: N/A;   CARDIAC CATHETERIZATION     COLONOSCOPY  12 years ago   in [place] clinic= normal exam per pt   INTRAOPERATIVE TRANSTHORACIC ECHOCARDIOGRAM N/A 01/11/2022   Procedure: INTRAOPERATIVE TRANSTHORACIC ECHOCARDIOGRAM;  Surgeon: Sherren Mocha, MD;  Location: [facility];  Service: Open Heart Surgery;  Laterality: N/A;   KNEE SURGERY     MULTIPLE EXTRACTIONS WITH ALVEOLOPLASTY N/A 12/09/2021   Procedure: MULTIPLE EXTRACTION;  Surgeon: Charlaine Dalton, DMD;  Location: [facility];  Service: Dentistry;  Laterality: N/A;   PERIPHERAL VASCULAR  INTERVENTION Right 08/08/2019   Procedure: PERIPHERAL VASCULAR INTERVENTION;  Surgeon: Lorretta Harp, MD;  Location: [facility] [place] [facility];  Service: Cardiovascular;  Laterality: Right;   PERIPHERAL VASCULAR INTERVENTION Left 11/18/2019   Procedure: PERIPHERAL VASCULAR INTERVENTION;  Surgeon: Lorretta Harp, MD;  Location: [facility];  Service: Cardiovascular;  Laterality: Left;  left popliteal artery   RIGHT/LEFT HEART CATH AND CORONARY ANGIOGRAPHY N/A 10/29/2021   Procedure: RIGHT/LEFT HEART CATH AND CORONARY ANGIOGRAPHY;  Surgeon: Burnell Blanks, MD;  Location: [place] [facility];  Service: Cardiovascular;  Laterality: N/A;   TRANSCAROTID ARTERY REVASCULARIZATION  Left 12/16/2019   Procedure: TRANSCAROTID ARTERY REVASCULARIZATION;  Surgeon: Elam Dutch, MD;  Location: [place];  Service: Vascular;  Laterality: Left;   TRANSCAROTID ARTERY REVASCULARIZATION  Right 02/03/2020   Procedure: RIGHT TRANSCAROTID ARTERY REVASCULARIZATION;  Surgeon: Elam Dutch, MD;  Location: [place];  Service: Vascular;  Laterality: Right;   TRANSCATHETER AORTIC VALVE REPLACEMENT, TRANSFEMORAL N/A 01/11/2022   Procedure: Transcatheter Aortic Valve Replacement, Transfemoral;  Surgeon: Sherren Mocha, MD;  Location: [place] [facility];  Service: Open Heart Surgery;  Laterality: N/A;   ULTRASOUND GUIDANCE FOR VASCULAR ACCESS Right 12/16/2019   Procedure: ULTRASOUND GUIDANCE FOR VASCULAR ACCESS;  Surgeon: Elam Dutch, MD;  Location: [place];  Service: Vascular;  Laterality: Right;   ULTRASOUND GUIDANCE FOR VASCULAR ACCESS Right 02/03/2020   Procedure: ULTRASOUND GUIDANCE FOR VASCULAR ACCESS;  Surgeon: Elam Dutch, MD;  Location: [place];  Service: Vascular;  Laterality: Right;   VASECTOMY     Social History  Socioeconomic History   Marital status: Widowed    Spouse name: Not on file   Number of children: 3   Years of education: Not on file   Highest education level: High school  graduate  Occupational History   Occupation: Caregiver    Comment: Special needs    Comment: retired  Tobacco Use   Smoking status: Never   Smokeless tobacco: Never  Vaping Use   Vaping Use: Never used  Substance and Sexual Activity   Alcohol use: Yes    Comment: occasional   Drug use: No   Sexual activity: Not Currently  Other Topics Concern   Not on file  Social History Narrative   Widower.  3 daughters and 3 grandchildren.     Social Determinants of Health   Financial Resource Strain: Low Risk  (10/29/2021)   Overall Financial Resource Strain (CARDIA)    Difficulty of Paying Living Expenses: Not hard at all  Food Insecurity: No Food Insecurity (06/02/2022)   Hunger Vital Sign    Worried About Running Out of Food in the Last Year: Never true    Ran Out of Food in the Last Year: Never true  Transportation Needs: No Transportation Needs (06/02/2022)   PRAPARE - Hydrologist (Medical): No    Lack of Transportation (Non-Medical): No  Physical Activity: Sufficiently Active (12/06/2020)   Exercise Vital Sign    Days of Exercise per Week: 4 days    Minutes of Exercise per Session: 60 min  Stress: No Stress Concern Present (12/06/2020)   [place]    Feeling of Stress : Not at all  Social Connections: Moderately Isolated (12/06/2020)   Social Connection and Isolation Panel [NHANES]    Frequency of Communication with Friends and Family: More than three times a week    Frequency of Social Gatherings with Friends and Family: More than three times a week    Attends Religious Services: More than 4 times per year    Active Member of [employer] or Organizations: No    Attends Archivist Meetings: Never    Marital Status: Widowed   Family History  Problem Relation Age of Onset   CAD Father    Hypertension Father    Alcohol abuse Father        Cause of death   Diabetes Mother     Colon polyps Mother    CAD Brother 76       CABG   Colon cancer Neg Hx    Esophageal cancer Neg Hx    Rectal cancer Neg Hx    Stomach cancer Neg Hx    - negative except otherwise stated in the family history section Allergies  Allergen Reactions   Codeine Nausea And Vomiting   Norco [Hydrocodone-Acetaminophen] Nausea And Vomiting   Percocet [Oxycodone-Acetaminophen] Nausea And Vomiting   Tramadol Hcl Nausea And Vomiting   Prior to Admission medications   Medication Sig Start Date End Date Taking? Authorizing Provider  ARTIFICIAL TEARS PF 0.1-0.3 % SOLN Place 1 drop into both eyes every 8 (eight) hours as needed (for dryness).   Yes [provider]  aspirin EC 81 MG tablet Take 81 mg by mouth in the morning.   Yes [provider]  atorvastatin (LIPITOR) 40 MG tablet Take 40 mg by mouth in the morning. 07/03/20  Yes [provider]  carvedilol (COREG) 12.5 MG tablet Take 1 tablet (12.5 mg total) by  mouth 2 (two) times daily with a meal. 10/30/21 12/28/23 Yes Arrien, Jimmy Picket, MD  Cholecalciferol (VITAMIN D-3) 25 MCG (1000 UT) CAPS Take 1,000 Units by mouth daily.   Yes [provider]  clopidogrel (PLAVIX) 75 MG tablet Take 1 tablet (75 mg total) by mouth daily. 05/13/22  Yes Broadus John, MD  Continuous Blood Gluc Sensor (FREESTYLE LIBRE 14 DAY SENSOR) MISC Inject 1 Device into the skin every 14 (fourteen) days.   Yes [provider]  ferrous sulfate 325 (65 FE) MG tablet Take 325 mg by mouth daily with breakfast.   Yes [provider]  furosemide (LASIX) 40 MG tablet Take 40 mg by mouth daily at 4 PM.   Yes [provider]  furosemide (LASIX) 80 MG tablet Take 1 tablet (80 mg total) by mouth daily. Patient taking differently: Take 80 mg by mouth in the morning. 06/01/22  Yes Bonnielee Haff, MD  gabapentin (NEURONTIN) 800 MG tablet Take 800 mg by mouth at bedtime as needed (Sleep). 07/03/20  Yes [provider]   hydrALAZINE (APRESOLINE) 25 MG tablet TAKE 1 TABLET BY MOUTH THREE TIMES A DAY 06/07/22  Yes Lorretta Harp, MD  insulin NPH-regular Human (70-30) 100 UNIT/ML injection Inject 7-8 Units into the skin See admin instructions. Inject 8 units into the skin before breakfast and 7 units before the evening meal   Yes [provider]  isosorbide mononitrate (IMDUR) 30 MG 24 hr tablet Take 1 tablet (30 mg total) by mouth daily. 01/13/22 01/13/23 Yes Eileen Stanford, PA-C  NON FORMULARY Take 16 oz by mouth See admin instructions. [employer] Signature Organic Raw Kombucha, Ginger Lemonade- Drink 16 ounces by mouth once a day   Yes [provider]  polyethylene glycol (MIRALAX / GLYCOLAX) 17 g packet Take 17 g by mouth daily. Patient taking differently: Take 17 g by mouth daily as needed for mild constipation. 06/02/22  Yes Bonnielee Haff, MD  Povidone-Iodine (BETADINE ANTISEPTIC EX) Apply 1 application  topically See admin instructions. Use as directed every other day for wound care (right heel site)   Yes [provider]  PRESCRIPTION MEDICATION Inject 0.1-1 mLs as directed See admin instructions. Tri-Mix Standard Strength 5 ml. Formula: Prostaglandin 10 mcg/ml, Papaverine 30 mg/ml, Phentolamine 1 mg/ml. Inject 0.1 ml into side of penis as directed as needed (to be injected immediately before sexual intercourse) may increase the dose by 0.1 ml every 48 hours to achieve an erection. Max dose 1 ml.   Yes [provider]  sacubitril-valsartan (ENTRESTO) 49-51 MG Take 1 tablet by mouth 2 (two) times daily. 03/07/22  Yes Sabharwal, Aditya, DO  TYLENOL 325 MG CAPS Take 650 mg by mouth every 6 (six) hours as needed (for pain).   Yes [provider]  vitamin B-12 (CYANOCOBALAMIN) 500 MCG tablet Take 500 mcg by mouth in the morning. 08/24/21  Yes [provider]  HYDROcodone-acetaminophen (NORCO/VICODIN) 5-325 MG tablet Take 1 tablet by mouth every 6 (six) hours as  needed for moderate pain. Patient not taking: Reported on 06/07/2022 06/01/22 06/01/23  Bonnielee Haff, MD   MR FOOT RIGHT WO CONTRAST  Result Date: 06/07/2022 CLINICAL DATA:  Increasing right heel pain from ulcer for 1 month. Diabetic peripheral neuropathy EXAM: MRI OF THE RIGHT FOOT WITHOUT CONTRAST TECHNIQUE: Multiplanar, multisequence MR imaging of the right hindfoot was performed. No intravenous contrast was administered. COMPARISON:  RADIOGRAPHS DATED June 07, 2022 FINDINGS: Bones/Joint/Cartilage There is cortical erosion and marrow edema of the  posterolateral aspect of the calcaneus consistent with osteomyelitis. There is heterogeneous signal of the talar dome which is likely sequela of chronic osteochondral injury, likely a chronic process. No evidence of acute edema. Ligaments Ligaments of the lateral and medial compartment of the ankle appear intact. Muscles and Tendons Increased intramuscular signal of the flexor hallucis longus and plantar muscles of the foot suggesting diabetic myopathy/myositis. Soft tissues Deep skin wound with subcutaneous edema about the posterolateral aspect of the heel suggesting chronic diabetic ulcer. No fluid collection or abscess. Mild generalized subcutaneous soft tissue edema about the ankle and foot. IMPRESSION: 1. Acute osteomyelitis of the posterolateral aspect of the calcaneus. 2. Increased intramuscular signal of the flexor hallucis longus and plantar muscles of the foot suggesting diabetic myopathy/myositis. 3. Deep skin wound with subcutaneous edema about the posterolateral aspect of the heel suggesting chronic diabetic ulcer. No fluid collection or abscess. Electronically Signed   By: Keane Police D.O.   On: 06/07/2022 22:05   DG Foot Complete Right  Result Date: 06/07/2022 CLINICAL DATA:  Diabetic foot ulcer EXAM: RIGHT FOOT COMPLETE - 3+ VIEW COMPARISON:  05/12/2022 FINDINGS: Deep soft tissue ulceration at the posterior aspect of the right heel. No erosion  or periosteal elevation is evident of the underlying posterior calcaneus. No acute fracture or dislocation. Small plantar calcaneal spur. Similar degree of mild degenerative changes. Atherosclerotic vascular calcifications. No abnormally tracking soft tissue gas is seen. IMPRESSION: Deep soft tissue ulceration at the posterior aspect of the right heel. No radiographic evidence of osteomyelitis. Electronically Signed   By: Davina Poke D.O.   On: 06/07/2022 11:58   - pertinent xrays, CT, MRI studies were reviewed and independently interpreted  Positive ROS: All other systems have been reviewed and were otherwise negative with the exception of those mentioned in the HPI and as above.  Physical Exam: General: Alert, no acute distress Psychiatric: Patient is competent for consent with normal mood and affect Lymphatic: No axillary or cervical lymphadenopathy Cardiovascular: No pedal edema Respiratory: No cyanosis, no use of accessory musculature GI: No organomegaly, abdomen is soft and non-tender    Images:  @ENCIMAGES @  Labs:  Lab Results  Component Value Date   HGBA1C 7.9 (A) 04/25/2022   HGBA1C 8.2 (H) 01/07/2022   HGBA1C 8.0 (A) 09/29/2021   ESRSEDRATE 92 (H) 06/09/2022   CRP 14.2 (H) 06/09/2022   REPTSTATUS PENDING 06/08/2022   REPTSTATUS PENDING 06/08/2022   CULT  06/08/2022    NO GROWTH < 24 HOURS Performed at [facility], [place] [address] [place], [place] [address]    CULT  06/08/2022    NO GROWTH < 24 HOURS Performed at [facility] [address], [place], [place] [address]    LABORGA SERRATIA MARCESCENS 08/12/2012    Lab Results  Component Value Date   ALBUMIN 3.3 (L) 05/31/2022   ALBUMIN 3.2 (L) 05/30/2022   ALBUMIN 3.1 (L) 05/29/2022        Latest Ref Rng & Units 06/09/2022    1:01 AM 06/08/2022    5:41 AM 06/07/2022   12:18 PM  CBC EXTENDED  WBC 4.0 - 10.5 K/uL 12.8  14.6  13.9   RBC 4.22 - 5.81 MIL/uL 4.87  4.98  5.12   Hemoglobin 13.0  - 17.0 g/dL 12.8  13.1  13.7   HCT 39.0 - 52.0 % 40.0  41.8  41.6   Platelets 150 - 400 K/uL 272  262  267     Neurologic: Patient  does not have protective sensation bilateral lower extremities.   MUSCULOSKELETAL:   Skin: Examination patient's right foot is cold to the touch he has a ischemic ulcer over the calcaneus.  Review of the MRI scan shows mild edema in the bone consistent with osteomyelitis.  The edema is superficial beneath the ulcer.  There is no abscess.  Ankle-brachial indices shows monophasic flow with no toe pressure and an ABI of 0.71.  Patient states he has been taking a lot of ibuprofen lately and his creatinine has increased currently 3.07.  Assessment: Assessment: Ischemic ulcer right heel with peripheral vascular disease and early osteomyelitis superficial of the calcaneus.  Plan: Patient is currently scheduled for endovascular evaluation and possible stenting.  Patient will need to wait until his creatinine improves.  Anticipate endovascular procedure next week.  I will be out of town next week and could post for excision of the eschar right heel and partial calcaneal excision surgery on April 3.  Thank you for the consult and the opportunity to see Caleb [name], Caleb [name] [phone] 10:17 AM

## 2022-06-09 NOTE — Progress Notes (Signed)
  Progress Note    06/09/2022 7:40 AM * No surgery found *  Subjective:  wants to know if he can eat. No other complaints   Vitals:   06/08/22 2304 06/09/22 0323  BP: 105/80 (!) 95/58  Pulse: 65 (!) 58  Resp: 14 17  Temp: 97.7 F (36.5 C) 97.7 F (36.5 C)  SpO2: 100% 97%   Physical Exam: Cardiac:  regular Lungs:  non labored Extremities:  2+ femoral pulse, no distal pulses palpable. Right heel ulceration  Neurologic: alert and oriented  CBC    Component Value Date/Time   WBC 12.8 (H) 06/09/2022 0101   RBC 4.87 06/09/2022 0101   HGB 12.8 (L) 06/09/2022 0101   HGB 14.6 03/09/2022 1233   HGB 13.3 11/15/2019 1139   HCT 40.0 06/09/2022 0101   HCT 40.2 11/15/2019 1139   PLT 272 06/09/2022 0101   PLT 216 03/09/2022 1233   PLT 300 11/15/2019 1139   MCV 82.1 06/09/2022 0101   MCV 86 11/15/2019 1139   MCH 26.3 06/09/2022 0101   MCHC 32.0 06/09/2022 0101   RDW 15.3 06/09/2022 0101   RDW 14.5 11/15/2019 1139   LYMPHSABS 1.1 05/27/2022 1513   LYMPHSABS 3.3 (H) 09/28/2017 1113   MONOABS 0.5 05/27/2022 1513   EOSABS 0.0 05/27/2022 1513   EOSABS 0.3 09/28/2017 1113   BASOSABS 0.0 05/27/2022 1513   BASOSABS 0.0 09/28/2017 1113    BMET    Component Value Date/Time   NA 129 (L) 06/09/2022 0101   NA 142 02/16/2022 1544   K 4.8 06/09/2022 0101   CL 90 (L) 06/09/2022 0101   CO2 25 06/09/2022 0101   GLUCOSE 109 (H) 06/09/2022 0101   BUN 80 (H) 06/09/2022 0101   BUN 22 02/16/2022 1544   CREATININE 3.07 (H) 06/09/2022 0101   CREATININE 1.36 (H) 03/09/2022 1233   CREATININE 0.86 06/06/2016 1455   CALCIUM 8.5 (L) 06/09/2022 0101   GFRNONAA 21 (L) 06/09/2022 0101   GFRNONAA 56 (L) 03/09/2022 1233   GFRNONAA 87 04/02/2013 1632   GFRAA >60 12/17/2019 0354   GFRAA >89 04/02/2013 1632    INR    Component Value Date/Time   INR 1.0 01/11/2022 0957     Intake/Output Summary (Last 24 hours) at 06/09/2022 0740 Last data filed at 06/09/2022 0249 Gross per 24 hour  Intake  598 ml  Output 460 ml  Net 138 ml     Assessment/Plan:  72 y.o. male with CLI of right leg with calcaneal osteomyelitis  No changes overnight  Remains Afebrile On Rocephin and Daptomycin Continue Asa, statin, Plavix AKI with Scr 3.07 this morning Plan will likely be Angiography early next week once he has had some improvement of his renal function   [name], PA-C [facility] [phone] 06/09/2022 7:40 AM

## 2022-06-09 NOTE — Progress Notes (Signed)
Admit: 06/07/2022 LOS: 2  63M AKI on CKD3 with hyperkalemia with right heel ulcer with osteomyelitis and PVD  Subjective:  Transferred over to [facility] Has been seen by 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  He worked with several of our former physicians.  03/20 0701 - 03/21 0700 In: 598 [P.O.:598] Out: 460 [Urine:460]  There were no vitals filed for this visit.  Scheduled Meds:  aspirin  81 mg Oral Daily   atorvastatin  40 mg Oral Daily   carvedilol  12.5 mg Oral BID WC   clopidogrel  75 mg Oral Daily   docusate sodium  100 mg Oral BID   ferrous sulfate  325 mg Oral Q breakfast   heparin  5,000 Units Subcutaneous Q8H   hydrALAZINE  25 mg Oral TID   insulin aspart  0-15 Units Subcutaneous TID WC   insulin aspart  0-5 Units Subcutaneous QHS   insulin aspart protamine- aspart  8 Units Subcutaneous BID WC   Continuous Infusions:  sodium chloride 75 mL/hr at 06/09/22 1023   cefTRIAXone (ROCEPHIN)  IV 2 g (06/08/22 1529)   DAPTOmycin (CUBICIN) 600 mg in sodium chloride 0.9 % IVPB 600 mg (06/08/22 1212)   PRN Meds:.HYDROcodone-acetaminophen, ipratropium-albuterol, LORazepam, ondansetron **OR** ondansetron (ZOFRAN) IV, polyethylene glycol, traZODone  Current Labs: reviewed   Physical Exam:  Blood pressure (!) 121/95, pulse 65, temperature (!) 97.5 F (36.4 C), temperature source Oral, resp. rate 16, SpO2 98 %. NAD RRR CTAB S/nt/nd R Heal bandaged not examined No LEE Nonfocal NCAT EOMI  A AKI on CKD3: Likely related to hypovolemia while on Entresto; improving with medication withdrawal and hydration Hyperkalemia related to low GFR, ARB, resolved Right heel ulcer with underlying osteomyelitis and known PAD: Per orthopedics and vascular surgery, will need angiography next week Hypertension, blood pressures stable  currently receiving carvedilol and hydralazine Chronic HFrEF Status post TAVR  P Continue gentle hydration for another 24 hours  Continue to hold Entresto, furosemide  Daily weights, Daily Renal Panel, Strict I/Os, Avoid nephrotoxins (NSAIDs, judicious IV Contrast)  Medication Issues; Preferred narcotic agents for pain control are hydromorphone, fentanyl, and methadone. Morphine should not be used.  Baclofen should be avoided Avoid oral sodium phosphate and magnesium citrate based laxatives / bowel preps    Pearson Grippe MD 06/09/2022, 11:34 AM  Recent Labs  Lab 06/08/22 0541 06/08/22 0828 06/08/22 1148 06/08/22 1520 06/09/22 0101  NA 129* 126*  --   --  129*  K 6.6* 5.1 4.6 4.3 4.8  CL 93* 93*  --   --  90*  CO2 24 22  --   --  25  GLUCOSE 327* 293*  --   --  109*  BUN 86* 84*  --   --  80*  CREATININE 3.63* 3.50*  --   --  3.07*  CALCIUM 8.9 8.7*  --   --  8.5*   Recent Labs  Lab 06/07/22 1218 06/08/22 0541 06/09/22 0101  WBC 13.9* 14.6* 12.8*  HGB 13.7 13.1 12.8*  HCT 41.6 41.8 40.0  MCV 81.3 83.9 82.1  PLT 267 262 272

## 2022-06-10 DIAGNOSIS — M86171 Other acute osteomyelitis, right ankle and foot: Secondary | ICD-10-CM | POA: Diagnosis not present

## 2022-06-10 LAB — GLUCOSE, CAPILLARY
Glucose-Capillary: 184 mg/dL — ABNORMAL HIGH (ref 70–99)
Glucose-Capillary: 183 mg/dL — ABNORMAL HIGH (ref 70–99)
Glucose-Capillary: 138 mg/dL — ABNORMAL HIGH (ref 70–99)
Glucose-Capillary: 135 mg/dL — ABNORMAL HIGH (ref 70–99)
Glucose-Capillary: 143 mg/dL — ABNORMAL HIGH (ref 70–99)

## 2022-06-10 LAB — BASIC METABOLIC PANEL
Anion gap: 6 (ref 5–15)
Anion gap: 8 (ref 5–15)
BUN: 63 mg/dL — ABNORMAL HIGH (ref 8–23)
BUN: 54 mg/dL — ABNORMAL HIGH (ref 8–23)
CO2: 27 mmol/L (ref 22–32)
CO2: 25 mmol/L (ref 22–32)
Calcium: 8.5 mg/dL — ABNORMAL LOW (ref 8.9–10.3)
Calcium: 8.5 mg/dL — ABNORMAL LOW (ref 8.9–10.3)
Chloride: 96 mmol/L — ABNORMAL LOW (ref 98–111)
Chloride: 97 mmol/L — ABNORMAL LOW (ref 98–111)
Creatinine, Ser: 2.01 mg/dL — ABNORMAL HIGH (ref 0.61–1.24)
Creatinine, Ser: 1.71 mg/dL — ABNORMAL HIGH (ref 0.61–1.24)
GFR, Estimated: 35 mL/min — ABNORMAL LOW (ref >60)
GFR, Estimated: 42 mL/min — ABNORMAL LOW (ref >60)
Glucose, Bld: 209 mg/dL — ABNORMAL HIGH (ref 70–99)
Glucose, Bld: 126 mg/dL — ABNORMAL HIGH (ref 70–99)
Potassium: 5.5 mmol/L — ABNORMAL HIGH (ref 3.5–5.1)
Potassium: 4.3 mmol/L (ref 3.5–5.1)
Sodium: 129 mmol/L — ABNORMAL LOW (ref 135–145)
Sodium: 130 mmol/L — ABNORMAL LOW (ref 135–145)

## 2022-06-10 LAB — CBC
HCT: 39.2 % (ref 39.0–52.0)
Hemoglobin: 13 g/dL (ref 13.0–17.0)
MCH: 26.9 pg (ref 26.0–34.0)
MCHC: 33.2 g/dL (ref 30.0–36.0)
MCV: 81 fL (ref 80.0–100.0)
Platelets: 298 10*3/uL (ref 150–400)
RBC: 4.84 MIL/uL (ref 4.22–5.81)
RDW: 15.3 % (ref 11.5–15.5)
WBC: 13.1 10*3/uL — ABNORMAL HIGH (ref 4.0–10.5)
nRBC: 0 % (ref 0.0–0.2)

## 2022-06-10 MED ORDER — INSULIN ASPART PROT & ASPART (70-30 MIX) 100 UNIT/ML ~~LOC~~ SUSP
7.0000 [IU] | Freq: Two times a day (BID) | SUBCUTANEOUS | Status: DC
  Administered 2022-06-10 – 2022-06-13 (×7): 7 [IU] via SUBCUTANEOUS

## 2022-06-10 MED ORDER — SODIUM ZIRCONIUM CYCLOSILICATE 10 G PO PACK
10.0000 g | PACK | Freq: Once | ORAL | Status: AC
  Administered 2022-06-10: 10 g via ORAL
  Filled 2022-06-10: qty 1

## 2022-06-10 NOTE — Inpatient Diabetes Management (Signed)
Inpatient Diabetes Program Recommendations  AACE/ADA: New Consensus Statement on Inpatient Glycemic Control (2015)  Target Ranges:  Prepandial:   less than 140 mg/dL      Peak postprandial:   less than 180 mg/dL (1-2 hours)      Critically ill patients:  140 - 180 mg/dL   Lab Results  Component Value Date   GLUCAP 183 (H) 06/10/2022   HGBA1C 7.9 (A) 04/25/2022    Latest Reference Range & Units 06/09/22 11:16 06/09/22 15:57 06/09/22 21:07 06/10/22 05:59 06/10/22 07:34  Glucose-Capillary 70 - 99 mg/dL 224 (H) 217 (H) 150 (H) 184 (H) 183 (H)  (H): Data is abnormally high Review of Glycemic Control  Diabetes history: type 2 Outpatient Diabetes medications: 70/30 insulin 8 units with breakfast, 7 units with supper Current orders for Inpatient glycemic control: Semglee 6 units daily, 70/30 insulin 5 units BID, Novolog 0-15 units correction scale TID, 0-5 units scale at [employer]  Inpatient Diabetes Program Recommendations:   Noted that patient takes only 70/30 insulin at home. Recommend discontinuing Semglee and continuing 70/30 insulin 7 units at breakfast, and 7 units at supper in the hospital. Continue Novolog 0-15 units correction scale TID, 0-5 units at HS.   Harvel Ricks RN BSN CDE Diabetes Coordinator Pager: [phone]  8am-5pm

## 2022-06-10 NOTE — Progress Notes (Signed)
PROGRESS NOTE    Caleb [name]  [id number] DOB: Feb 02, 1951 DOA: 06/07/2022 PCP: [name], MD     Brief Narrative:  Caleb [name] is a 72 year old man with PVD, DM 2, HTN, AAS s/p TAVR, HFrEF with a EF of 35% send CKD 3 was admitted with recurrent diabetic foot infection and acute on chronic kidney injury.  Workup has revealed acute osteomyelitis in the posterolateral aspect of the calcaneus.  Patient is being treated with gentle IV hydration at 75 cc an hour and doxycycline/Augmentin pending surgery/bone biopsy per ID recommendations.  Patient is being seen by vascular surgery who would like to do an angiogram however we are waiting improvement in creatinine prior to proceeding with dye load needed for the angiogram.  New events last 24 hours / Subjective: States pain in his right heel has improved.   Assessment & Plan:   Principal Problem:   Acute osteomyelitis of right calcaneus (HCC) Active Problems:   Type 2 diabetes mellitus with hyperlipidemia (HCC)   Acute kidney injury superimposed on chronic kidney disease (HCC)   HTN (hypertension)   Hyperlipidemia   Hyponatremia   Dyslipidemia   PAD (peripheral artery disease) (HCC)   S/P TAVR (transcatheter aortic valve replacement)   Decubitus ulcer of right heel   Diabetic foot ulcer ([place])   Sepsis secondary to acute osteomyelitis right heel and PVD  -Sepsis POA  -Appreciate infectious disease -Continue doxycycline, Augmentin -Vascular surgery planning for angiography early next week -Aspirin, Plavix, lipitor  -[name] will follow-up early April when he returns back in town for excision of right heel eschar and partial calcaneal excision surgery  AKI on CKD stage IIIb -Baseline Cr 2-2.5 -Appreciate nephrology -Thought to be secondary to hypovolemia while on Entresto -Continue to hold Entresto and Lasix -Nephrology signed off 3/22.  Follow-up with [name] later this month -Improving  Hyperkalemia -Lokelma  today, monitor  DM type 2  -Novolog mix BID, SSI     DVT prophylaxis:  heparin injection 5,000 Units Start: 06/07/22 2200 SCDs Start: 06/07/22 1348  Code Status: Full Family Communication: None at bedside  Disposition Plan:  Status is: Inpatient Remains inpatient appropriate because: Procedure pending next week     Antimicrobials:  Anti-infectives (From admission, onward)    Start     Dose/Rate Route Frequency Ordered Stop   06/09/22 1245  doxycycline (VIBRA-TABS) tablet 100 mg        100 mg Oral Every 12 hours 06/09/22 1145     06/09/22 1245  amoxicillin-clavulanate (AUGMENTIN) 500-125 MG per tablet 1 tablet        1 tablet Oral 2 times daily 06/09/22 1145     06/08/22 1600  cefTRIAXone (ROCEPHIN) 2 g in sodium chloride 0.9 % 100 mL IVPB  Status:  Discontinued        2 g 200 mL/hr over 30 Minutes Intravenous Every 24 hours 06/08/22 0852 06/09/22 1145   06/08/22 1200  DAPTOmycin (CUBICIN) 600 mg in sodium chloride 0.9 % IVPB  Status:  Discontinued        8 mg/kg  77.4 kg 124 mL/hr over 30 Minutes Intravenous Every 48 hours 06/08/22 1012 06/09/22 1145   06/07/22 1415  linezolid (ZYVOX) IVPB 600 mg  Status:  Discontinued        600 mg 300 mL/hr over 60 Minutes Intravenous Every 12 hours 06/07/22 1414 06/08/22 0852   06/07/22 1415  ceFEPIme (MAXIPIME) 2 g in sodium chloride 0.9 % 100 mL IVPB  Status:  Discontinued        2 g 200 mL/hr over 30 Minutes Intravenous Daily 06/07/22 1414 06/08/22 0852        Objective: Vitals:   06/10/22 0306 06/10/22 0740 06/10/22 1000 06/10/22 1215  BP: (!) 152/108 (!) 146/112  (!) 156/117  Pulse:  86  87  Resp:  16    Temp: 98 F (36.7 C) 97.6 F (36.4 C)  98.1 F (36.7 C)  TempSrc: Oral Oral  Oral  SpO2:  93%  99%  Weight:   87.9 kg     Intake/Output Summary (Last 24 hours) at 06/10/2022 1427 Last data filed at 06/10/2022 0847 Gross per 24 hour  Intake 1593.46 ml  Output 550 ml  Net 1043.46 ml   Filed Weights   06/10/22  1000  Weight: 87.9 kg    Examination:  General exam: Appears calm and comfortable  Respiratory system: Clear to auscultation. Respiratory effort normal. No respiratory distress. No conversational dyspnea.  Cardiovascular system: S1 & S2 heard, RRR. No murmurs. No pedal edema. Gastrointestinal system: Abdomen is nondistended, soft and nontender. Normal bowel sounds heard. Central nervous system: Alert and oriented. No focal neurological deficits. Speech clear.  Extremities: Symmetric in appearance  Psychiatry: Judgement and insight appear normal. Mood & affect appropriate.   Data Reviewed: I have personally reviewed following labs and imaging studies  CBC: Recent Labs  Lab 06/07/22 1218 06/08/22 0541 06/09/22 0101 06/10/22 0123  WBC 13.9* 14.6* 12.8* 13.1*  HGB 13.7 13.1 12.8* 13.0  HCT 41.6 41.8 40.0 39.2  MCV 81.3 83.9 82.1 81.0  PLT 267 262 272 [id number]   Basic Metabolic Panel: Recent Labs  Lab 06/07/22 1218 06/08/22 0541 06/08/22 0828 06/08/22 1148 06/08/22 1520 06/09/22 0101 06/10/22 0123  NA 128* 129* 126*  --   --  129* 129*  K 5.5* 6.6* 5.1 4.6 4.3 4.8 5.5*  CL 94* 93* 93*  --   --  90* 96*  CO2 20* 24 22  --   --  25 27  GLUCOSE 270* 327* 293*  --   --  109* 209*  BUN 75* 86* 84*  --   --  80* 63*  CREATININE 2.97* 3.63* 3.50*  --   --  3.07* 2.01*  CALCIUM 8.7* 8.9 8.7*  --   --  8.5* 8.5*   GFR: Estimated Creatinine Clearance: 35.1 mL/min (A) (by C-G formula based on SCr of 2.01 mg/dL (H)). Liver Function Tests: No results for input(s): "AST", "ALT", "ALKPHOS", "BILITOT", "PROT", "ALBUMIN" in the last 168 hours. No results for input(s): "LIPASE", "AMYLASE" in the last 168 hours. No results for input(s): "AMMONIA" in the last 168 hours. Coagulation Profile: No results for input(s): "INR", "PROTIME" in the last 168 hours. Cardiac Enzymes: Recent Labs  Lab 06/09/22 0101  CKTOTAL 54   BNP (last 3 results) No results for input(s): "PROBNP" in the last  8760 hours. HbA1C: No results for input(s): "HGBA1C" in the last 72 hours. CBG: Recent Labs  Lab 06/09/22 1557 06/09/22 2107 06/10/22 0559 06/10/22 0734 06/10/22 1214  GLUCAP 217* 150* 184* 183* 138*   Lipid Profile: No results for input(s): "CHOL", "HDL", "LDLCALC", "TRIG", "CHOLHDL", "LDLDIRECT" in the last 72 hours. Thyroid Function Tests: No results for input(s): "TSH", "T4TOTAL", "FREET4", "T3FREE", "THYROIDAB" in the last 72 hours. Anemia Panel: No results for input(s): "VITAMINB12", "FOLATE", "FERRITIN", "TIBC", "IRON", "RETICCTPCT" in the last 72 hours. Sepsis Labs: Recent Labs  Lab 06/07/22 1218 06/07/22 1421 06/07/22 1844 06/08/22 [id number]  PROCALCITON  --   --   --  1.00  LATICACIDVEN 2.1* 2.5* 3.0*  --     Recent Results (from the past 240 hour(s))  Culture, blood (Routine X 2) w Reflex to ID Panel     Status: None (Preliminary result)   Collection Time: 06/08/22  8:28 AM   Specimen: BLOOD  Result Value Ref Range Status   Specimen Description   Final    BLOOD RIGHT ANTECUBITAL Performed at [facility] [address], [place], [place] [address]    Special Requests   Final    BOTTLES DRAWN AEROBIC ONLY Blood Culture adequate volume Performed at [facility] [address] [place], [place] [address]    Culture   Final    NO GROWTH 2 DAYS Performed at [facility] [address], [place], [place] [address]    Report Status PENDING  Incomplete  Culture, blood (Routine X 2) w Reflex to ID Panel     Status: None (Preliminary result)   Collection Time: 06/08/22  8:28 AM   Specimen: BLOOD RIGHT HAND  Result Value Ref Range Status   Specimen Description   Final    BLOOD RIGHT HAND Performed at [facility] [address] [place], [place] [address]    Special Requests   Final    BOTTLES DRAWN AEROBIC ONLY Blood Culture adequate volume Performed at [facility]  [address], [place], [place] [address]    Culture   Final    NO GROWTH 2 DAYS Performed at [facility] [address], [place], [place] [address]    Report Status PENDING  Incomplete      Radiology Studies: No results found.    Scheduled Meds:  amoxicillin-clavulanate  1 tablet Oral BID   aspirin  81 mg Oral Daily   atorvastatin  40 mg Oral Daily   carvedilol  12.5 mg Oral BID WC   clopidogrel  75 mg Oral Daily   docusate sodium  100 mg Oral BID   doxycycline  100 mg Oral Q12H   ferrous sulfate  325 mg Oral Q breakfast   heparin  5,000 Units Subcutaneous Q8H   hydrALAZINE  25 mg Oral TID   insulin aspart  0-15 Units Subcutaneous TID WC   insulin aspart  0-5 Units Subcutaneous QHS   insulin aspart protamine- aspart  7 Units Subcutaneous BID WC   Continuous Infusions:   LOS: 3 days   Time spent: 25 minutes   Dessa Phi, DO [facility] 06/10/2022, 2:27 PM   Available via Epic secure chat 7am-7pm After these hours, please refer to coverage provider listed on [email or web address]

## 2022-06-10 NOTE — Inpatient Diabetes Management (Signed)
Inpatient Diabetes Program Recommendations  AACE/ADA: New Consensus Statement on Inpatient Glycemic Control (2015)  Target Ranges:  Prepandial:   less than 140 mg/dL      Peak postprandial:   less than 180 mg/dL (1-2 hours)      Critically ill patients:  140 - 180 mg/dL   Lab Results  Component Value Date   GLUCAP 138 (H) 06/10/2022   HGBA1C 7.9 (A) 04/25/2022    Review of Glycemic Control  Diabetes history: type 2 Outpatient Diabetes medications: 70/30 insulin 8 units with breakfast, 7 units with supper Current orders for Inpatient glycemic control: 70/30 insulin 5 units BID, Novolog 0-15 units correction scale TID, 0-5 units scale at [facility]   Inpatient Diabetes Program Recommendations:   Attempted to see patient at the bedside, but patient was snoring and would not wake up after being called his name 3-4 times.   Will continue to monitor blood sugars while in the hospital.  Harvel Ricks RN BSN CDE Diabetes Coordinator Pager: [phone]  8am-5pm

## 2022-06-10 NOTE — Care Management Important Message (Signed)
Important Message  Patient Details  Name: Caleb [name] MRN: [id number] Date of Birth: [birth year]   Medicare Important Message Given:  Yes     [name] [address] 06/10/2022, 12:41 PM

## 2022-06-10 NOTE — Plan of Care (Signed)
  Problem: Education: Goal: Knowledge of General Education information will improve Description: Including pain rating scale, medication(s)/side effects and non-pharmacologic comfort measures Outcome: Progressing   Problem: Activity: Goal: Risk for activity intolerance will decrease Outcome: Progressing   Problem: Nutrition: Goal: Adequate nutrition will be maintained Outcome: Progressing   

## 2022-06-10 NOTE — Progress Notes (Signed)
Admit: 06/07/2022 LOS: 3  26M AKI on CKD3 with hyperkalemia with right heel ulcer with osteomyelitis and PVD  Subjective:  No interval events Daughter who is a pediatrician joins [place] by phone Infectious diseases is following and placed on oral antibiotics Creatinine significantly improved to 2.0 Potassium mildly elevated at 5.5  03/21 0701 - 03/22 0700 In: 555.4 [P.O.:120; I.V.:435.4] Out: 1050 [Urine:1050]  Filed Weights   06/10/22 1000  Weight: 87.9 kg    Scheduled Meds:  amoxicillin-clavulanate  1 tablet Oral BID   aspirin  81 mg Oral Daily   atorvastatin  40 mg Oral Daily   carvedilol  12.5 mg Oral BID WC   clopidogrel  75 mg Oral Daily   docusate sodium  100 mg Oral BID   doxycycline  100 mg Oral Q12H   ferrous sulfate  325 mg Oral Q breakfast   heparin  5,000 Units Subcutaneous Q8H   hydrALAZINE  25 mg Oral TID   insulin aspart  0-15 Units Subcutaneous TID WC   insulin aspart  0-5 Units Subcutaneous QHS   insulin aspart protamine- aspart  7 Units Subcutaneous BID WC   Continuous Infusions:   PRN Meds:.HYDROcodone-acetaminophen, ipratropium-albuterol, LORazepam, ondansetron **OR** ondansetron (ZOFRAN) IV, polyethylene glycol, traZODone  Current Labs: reviewed   Physical Exam:  Blood pressure (!) 146/112, pulse 86, temperature 97.6 F (36.4 C), temperature source Oral, resp. rate 17, weight 87.9 kg, SpO2 93 %. NAD RRR CTAB S/nt/nd R Heal bandaged not examined No LEE Nonfocal NCAT EOMI  A Resolving nonoliguric AKI on CKD3: Likely related to hypovolemia while on Entresto; improving with medication withdrawal and hydration Hyperkalemia related to low GFR, ARB, mildly elevated again today Right heel ulcer with underlying osteomyelitis and known PAD: Per orthopedics, ID, and vascular surgery, will need angiography next week Hypertension, blood pressures stable currently receiving carvedilol and hydralazine Chronic HFrEF Status post TAVR  P Stop  IVFs Lokelma 5gm today Continue to hold Entresto, furosemide  No further nephrology suggestions, will sign off, all questions answered He will need follow-up, I will arrange an appointment to see me within the month Daily weights, Daily Renal Panel, Strict I/Os, Avoid nephrotoxins (NSAIDs, judicious IV Contrast)    Pearson Grippe MD 06/10/2022, 10:56 AM  Recent Labs  Lab 06/08/22 0828 06/08/22 1148 06/08/22 1520 06/09/22 0101 06/10/22 0123  NA 126*  --   --  129* 129*  K 5.1   < > 4.3 4.8 5.5*  CL 93*  --   --  90* 96*  CO2 22  --   --  25 27  GLUCOSE 293*  --   --  109* 209*  BUN 84*  --   --  80* 63*  CREATININE 3.50*  --   --  3.07* 2.01*  CALCIUM 8.7*  --   --  8.5* 8.5*   < > = values in this interval not displayed.    Recent Labs  Lab 06/08/22 0541 06/09/22 0101 06/10/22 0123  WBC 14.6* 12.8* 13.1*  HGB 13.1 12.8* 13.0  HCT 41.8 40.0 39.2  MCV 83.9 82.1 81.0  PLT 262 272 298

## 2022-06-10 NOTE — Consult Note (Signed)
   [facility] Inpatient Consult   06/10/2022  BENY BURAS 1950/04/18 [id number]  [facility] [ACO] Patient: [employer]  Primary Care Provider:  [name], MD  Reason:  Readmission less than 7 days with high risk for unplanned readmission scores.  Patient is currently active with [facility] for chronic disease management services.  Patient has been engaged by a [facility] RN CC.  Our community based plan of care has focused on disease management and community resource support.  Also, patient has been outreached by [facility]. Met with patient at the bedside, and confirms ongoing needs. Patient states he is awaiting follow up regarding his wound. He endorses ongoing needs for the PREP class with [facility] team.  Patient will receive a post hospital call and will be evaluated for assessments and disease process education.    Plan:  Following for additional needs with  Inpatient Transition Of Care [TOC] team.  Will follow up with [facility] team with updates.  Of note, [facility] Care Management services does not replace or interfere with any services that are needed or arranged by inpatient [facility] care management team.   For additional questions or referrals please contact:  Natividad Brood, RN BSN [facility]  [phone] business mobile phone Toll free office [phone]  *[place]  [phone] Fax number: [phone] [email or web address]

## 2022-06-11 DIAGNOSIS — M86171 Other acute osteomyelitis, right ankle and foot: Secondary | ICD-10-CM | POA: Diagnosis not present

## 2022-06-11 LAB — CBC
HCT: 38.4 % — ABNORMAL LOW (ref 39.0–52.0)
Hemoglobin: 12.8 g/dL — ABNORMAL LOW (ref 13.0–17.0)
MCH: 27.1 pg (ref 26.0–34.0)
MCHC: 33.3 g/dL (ref 30.0–36.0)
MCV: 81.4 fL (ref 80.0–100.0)
Platelets: 305 10*3/uL (ref 150–400)
RBC: 4.72 MIL/uL (ref 4.22–5.81)
RDW: 15.3 % (ref 11.5–15.5)
WBC: 13 10*3/uL — ABNORMAL HIGH (ref 4.0–10.5)
nRBC: 0 % (ref 0.0–0.2)

## 2022-06-11 LAB — BASIC METABOLIC PANEL
Anion gap: 8 (ref 5–15)
BUN: 48 mg/dL — ABNORMAL HIGH (ref 8–23)
CO2: 26 mmol/L (ref 22–32)
Calcium: 8.6 mg/dL — ABNORMAL LOW (ref 8.9–10.3)
Chloride: 96 mmol/L — ABNORMAL LOW (ref 98–111)
Creatinine, Ser: 1.63 mg/dL — ABNORMAL HIGH (ref 0.61–1.24)
GFR, Estimated: 44 mL/min — ABNORMAL LOW (ref >60)
Glucose, Bld: 123 mg/dL — ABNORMAL HIGH (ref 70–99)
Potassium: 4.2 mmol/L (ref 3.5–5.1)
Sodium: 130 mmol/L — ABNORMAL LOW (ref 135–145)

## 2022-06-11 LAB — GLUCOSE, CAPILLARY
Glucose-Capillary: 137 mg/dL — ABNORMAL HIGH (ref 70–99)
Glucose-Capillary: 138 mg/dL — ABNORMAL HIGH (ref 70–99)
Glucose-Capillary: 183 mg/dL — ABNORMAL HIGH (ref 70–99)
Glucose-Capillary: 192 mg/dL — ABNORMAL HIGH (ref 70–99)
Glucose-Capillary: 121 mg/dL — ABNORMAL HIGH (ref 70–99)

## 2022-06-11 NOTE — Plan of Care (Signed)

## 2022-06-11 NOTE — Progress Notes (Signed)
   06/11/22 0742  Vitals  Temp 98.1 F (36.7 C)  Temp Source Oral  BP (!) 149/105  MAP (mmHg) 119  BP Location Left Arm  BP Method Automatic  Patient Position (if appropriate) Lying  Pulse Rate 85  Pulse Rate Source Monitor  ECG Heart Rate 85  Resp (!) 0  MEWS COLOR  MEWS Score Color Yellow  Oxygen Therapy  SpO2 95 %  O2 Device Nasal Cannula  MEWS Score  MEWS Temp 0  MEWS Systolic 0  MEWS Pulse 0  MEWS RR 2  MEWS LOC 0  MEWS Score 2

## 2022-06-11 NOTE — Progress Notes (Signed)
   06/11/22 1200  Assess: MEWS Score  Temp 98.3 F (36.8 C)  BP (!) 126/99  Pulse Rate 87  ECG Heart Rate 84  Resp (!) 29  Level of Consciousness Alert  SpO2 98 %  O2 Device Nasal Cannula  O2 Flow Rate (L/min) 0.5 L/min  Assess: MEWS Score  MEWS Temp 0  MEWS Systolic 0  MEWS Pulse 0  MEWS RR 2  MEWS LOC 0  MEWS Score 2  MEWS Score Color Yellow  Assess: SIRS CRITERIA  SIRS Temperature  0  SIRS Pulse 0  SIRS Respirations  1  SIRS WBC 1  SIRS Score Sum  2

## 2022-06-11 NOTE — Progress Notes (Signed)
PROGRESS NOTE    Caleb [name]  [id number] DOB: 1950-08-01 DOA: 06/07/2022 PCP: [name], MD     Brief Narrative:  Caleb [name] is a 72 year old man with PVD, DM 2, HTN, AAS s/p TAVR, HFrEF with a EF of 35% send CKD 3 was admitted with recurrent diabetic foot infection and acute on chronic kidney injury.  Workup has revealed acute osteomyelitis in the posterolateral aspect of the calcaneus.  Patient is being treated with gentle IV hydration at 75 cc an hour and doxycycline/Augmentin pending surgery/bone biopsy per ID recommendations.  Patient is being seen by vascular surgery who would like to do an angiogram however we are waiting improvement in creatinine prior to proceeding with dye load needed for the angiogram.  New events last 24 hours / Subjective: In good spirits today.  No new complaints  Assessment & Plan:   Principal Problem:   Acute osteomyelitis of right calcaneus (HCC) Active Problems:   Type 2 diabetes mellitus with hyperlipidemia (HCC)   Acute kidney injury superimposed on chronic kidney disease (HCC)   HTN (hypertension)   Hyperlipidemia   Hyponatremia   Dyslipidemia   PAD (peripheral artery disease) (HCC)   S/P TAVR (transcatheter aortic valve replacement)   Decubitus ulcer of right heel   Diabetic foot ulcer ([place])   Sepsis secondary to acute osteomyelitis right heel and PVD  -Sepsis POA  -Appreciate infectious disease -Continue doxycycline, Augmentin -Vascular surgery planning for angiography early next week -Aspirin, Plavix, lipitor  -[name] will follow-up early April when he returns back in town for excision of right heel eschar and partial calcaneal excision surgery  AKI on CKD stage IIIb -Baseline Cr 2-2.5 -Appreciate nephrology -Thought to be secondary to hypovolemia while on Entresto -Continue to hold Entresto and Lasix -Nephrology signed off 3/22.  Follow-up with [name] later this  month -Improving  Hyperkalemia -Resolved  DM type 2  -Novolog mix BID, SSI     DVT prophylaxis:  heparin injection 5,000 Units Start: 06/07/22 2200 SCDs Start: 06/07/22 1348  Code Status: Full Family Communication: None at bedside  Disposition Plan:  Status is: Inpatient Remains inpatient appropriate because: Procedure pending next week     Antimicrobials:  Anti-infectives (From admission, onward)    Start     Dose/Rate Route Frequency Ordered Stop   06/09/22 1245  doxycycline (VIBRA-TABS) tablet 100 mg        100 mg Oral Every 12 hours 06/09/22 1145     06/09/22 1245  amoxicillin-clavulanate (AUGMENTIN) 500-125 MG per tablet 1 tablet        1 tablet Oral 2 times daily 06/09/22 1145     06/08/22 1600  cefTRIAXone (ROCEPHIN) 2 g in sodium chloride 0.9 % 100 mL IVPB  Status:  Discontinued        2 g 200 mL/hr over 30 Minutes Intravenous Every 24 hours 06/08/22 0852 06/09/22 1145   06/08/22 1200  DAPTOmycin (CUBICIN) 600 mg in sodium chloride 0.9 % IVPB  Status:  Discontinued        8 mg/kg  77.4 kg 124 mL/hr over 30 Minutes Intravenous Every 48 hours 06/08/22 1012 06/09/22 1145   06/07/22 1415  linezolid (ZYVOX) IVPB 600 mg  Status:  Discontinued        600 mg 300 mL/hr over 60 Minutes Intravenous Every 12 hours 06/07/22 1414 06/08/22 0852   06/07/22 1415  ceFEPIme (MAXIPIME) 2 g in sodium chloride 0.9 % 100 mL IVPB  Status:  Discontinued  2 g 200 mL/hr over 30 Minutes Intravenous Daily 06/07/22 1414 06/08/22 0852        Objective: Vitals:   06/11/22 0824 06/11/22 0827 06/11/22 1200 06/11/22 1214  BP: (!) 155/121 (!) 155/121 (!) 126/99 (!) 129/99  Pulse: 90 94 87 87  Resp: 17 16 (!) 29 (!) 29  Temp: 98 F (36.7 C) 98 F (36.7 C) 98.3 F (36.8 C) 98.3 F (36.8 C)  TempSrc: Oral  Oral   SpO2: 98% 98% 98%   Weight:        Intake/Output Summary (Last 24 hours) at 06/11/2022 1315 Last data filed at 06/11/2022 0830 Gross per 24 hour  Intake 480 ml   Output 300 ml  Net 180 ml    Filed Weights   06/10/22 1000 06/11/22 0709  Weight: 87.9 kg 68.6 kg    Examination:  General exam: Appears calm and comfortable  Respiratory system: Clear to auscultation. Respiratory effort normal. No respiratory distress. No conversational dyspnea.  Cardiovascular system: S1 & S2 heard, RRR. No murmurs. No pedal edema. Gastrointestinal system: Abdomen is nondistended, soft and nontender. Normal bowel sounds heard. Central nervous system: Alert and oriented. No focal neurological deficits. Speech clear.  Extremities: Symmetric in appearance  Psychiatry: Judgement and insight appear normal. Mood & affect appropriate.   Data Reviewed: I have personally reviewed following labs and imaging studies  CBC: Recent Labs  Lab 06/07/22 1218 06/08/22 0541 06/09/22 0101 06/10/22 0123 06/11/22 0047  WBC 13.9* 14.6* 12.8* 13.1* 13.0*  HGB 13.7 13.1 12.8* 13.0 12.8*  HCT 41.6 41.8 40.0 39.2 38.4*  MCV 81.3 83.9 82.1 81.0 81.4  PLT 267 262 272 298 [id number]    Basic Metabolic Panel: Recent Labs  Lab 06/08/22 0828 06/08/22 1148 06/08/22 1520 06/09/22 0101 06/10/22 0123 06/10/22 1525 06/11/22 0047  NA 126*  --   --  129* 129* 130* 130*  K 5.1   < > 4.3 4.8 5.5* 4.3 4.2  CL 93*  --   --  90* 96* 97* 96*  CO2 22  --   --  25 27 25 26   GLUCOSE 293*  --   --  109* 209* 126* 123*  BUN 84*  --   --  80* 63* 54* 48*  CREATININE 3.50*  --   --  3.07* 2.01* 1.71* 1.63*  CALCIUM 8.7*  --   --  8.5* 8.5* 8.5* 8.6*   < > = values in this interval not displayed.    GFR: Estimated Creatinine Clearance: 38.3 mL/min (A) (by C-G formula based on SCr of 1.63 mg/dL (H)). Liver Function Tests: No results for input(s): "AST", "ALT", "ALKPHOS", "BILITOT", "PROT", "ALBUMIN" in the last 168 hours. No results for input(s): "LIPASE", "AMYLASE" in the last 168 hours. No results for input(s): "AMMONIA" in the last 168 hours. Coagulation Profile: No results for input(s):  "INR", "PROTIME" in the last 168 hours. Cardiac Enzymes: Recent Labs  Lab 06/09/22 0101  CKTOTAL 54    BNP (last 3 results) No results for input(s): "PROBNP" in the last 8760 hours. HbA1C: No results for input(s): "HGBA1C" in the last 72 hours. CBG: Recent Labs  Lab 06/10/22 1555 06/10/22 2104 06/11/22 0553 06/11/22 0739 06/11/22 1202  GLUCAP 135* 143* 137* 138* 183*    Lipid Profile: No results for input(s): "CHOL", "HDL", "LDLCALC", "TRIG", "CHOLHDL", "LDLDIRECT" in the last 72 hours. Thyroid Function Tests: No results for input(s): "TSH", "T4TOTAL", "FREET4", "T3FREE", "THYROIDAB" in the last 72 hours. Anemia Panel: No results for  input(s): "VITAMINB12", "FOLATE", "FERRITIN", "TIBC", "IRON", "RETICCTPCT" in the last 72 hours. Sepsis Labs: Recent Labs  Lab 06/07/22 1218 06/07/22 1421 06/07/22 1844 06/08/22 0828  PROCALCITON  --   --   --  1.00  LATICACIDVEN 2.1* 2.5* 3.0*  --      Recent Results (from the past 240 hour(s))  Culture, blood (Routine X 2) w Reflex to ID Panel     Status: None (Preliminary result)   Collection Time: 06/08/22  8:28 AM   Specimen: BLOOD  Result Value Ref Range Status   Specimen Description   Final    BLOOD RIGHT ANTECUBITAL Performed at [facility], [place] [address], [place], [place] [address]    Special Requests   Final    BOTTLES DRAWN AEROBIC ONLY Blood Culture adequate volume Performed at [facility] [address], [place], [place] [address]    Culture   Final    NO GROWTH 3 DAYS Performed at [facility], [place] [address] [place], [place] [address]    Report Status PENDING  Incomplete  Culture, blood (Routine X 2) w Reflex to ID Panel     Status: None (Preliminary result)   Collection Time: 06/08/22  8:28 AM   Specimen: BLOOD RIGHT HAND  Result Value Ref Range Status   Specimen Description   Final    BLOOD RIGHT HAND Performed at [facility]  [address], [place], [place] [address]    Special Requests   Final    BOTTLES DRAWN AEROBIC ONLY Blood Culture adequate volume Performed at [facility] [address], [place], [place] [address]    Culture   Final    NO GROWTH 3 DAYS Performed at [facility], [place] [address], [place], [place] [address]    Report Status PENDING  Incomplete      Radiology Studies: No results found.    Scheduled Meds:  amoxicillin-clavulanate  1 tablet Oral BID   aspirin  81 mg Oral Daily   atorvastatin  40 mg Oral Daily   carvedilol  12.5 mg Oral BID WC   clopidogrel  75 mg Oral Daily   docusate sodium  100 mg Oral BID   doxycycline  100 mg Oral Q12H   ferrous sulfate  325 mg Oral Q breakfast   heparin  5,000 Units Subcutaneous Q8H   hydrALAZINE  25 mg Oral TID   insulin aspart  0-15 Units Subcutaneous TID WC   insulin aspart  0-5 Units Subcutaneous QHS   insulin aspart protamine- aspart  7 Units Subcutaneous BID WC   Continuous Infusions:   LOS: 4 days   Time spent: 25 minutes   Dessa Phi, DO [facility] 06/11/2022, 1:15 PM   Available via Epic secure chat 7am-7pm After these hours, please refer to coverage provider listed on [email or web address]

## 2022-06-12 DIAGNOSIS — I70234 Atherosclerosis of native arteries of right leg with ulceration of heel and midfoot: Secondary | ICD-10-CM | POA: Diagnosis not present

## 2022-06-12 DIAGNOSIS — M86171 Other acute osteomyelitis, right ankle and foot: Secondary | ICD-10-CM | POA: Diagnosis not present

## 2022-06-12 LAB — BASIC METABOLIC PANEL
Anion gap: 7 (ref 5–15)
BUN: 41 mg/dL — ABNORMAL HIGH (ref 8–23)
CO2: 24 mmol/L (ref 22–32)
Calcium: 8.5 mg/dL — ABNORMAL LOW (ref 8.9–10.3)
Chloride: 98 mmol/L (ref 98–111)
Creatinine, Ser: 1.51 mg/dL — ABNORMAL HIGH (ref 0.61–1.24)
GFR, Estimated: 49 mL/min — ABNORMAL LOW (ref >60)
Glucose, Bld: 140 mg/dL — ABNORMAL HIGH (ref 70–99)
Potassium: 4.4 mmol/L (ref 3.5–5.1)
Sodium: 129 mmol/L — ABNORMAL LOW (ref 135–145)

## 2022-06-12 LAB — CBC
HCT: 38.7 % — ABNORMAL LOW (ref 39.0–52.0)
Hemoglobin: 12.3 g/dL — ABNORMAL LOW (ref 13.0–17.0)
MCH: 26.3 pg (ref 26.0–34.0)
MCHC: 31.8 g/dL (ref 30.0–36.0)
MCV: 82.9 fL (ref 80.0–100.0)
Platelets: 301 10*3/uL (ref 150–400)
RBC: 4.67 MIL/uL (ref 4.22–5.81)
RDW: 15.4 % (ref 11.5–15.5)
WBC: 13.7 10*3/uL — ABNORMAL HIGH (ref 4.0–10.5)
nRBC: 0 % (ref 0.0–0.2)

## 2022-06-12 LAB — GLUCOSE, CAPILLARY
Glucose-Capillary: 129 mg/dL — ABNORMAL HIGH (ref 70–99)
Glucose-Capillary: 124 mg/dL — ABNORMAL HIGH (ref 70–99)
Glucose-Capillary: 193 mg/dL — ABNORMAL HIGH (ref 70–99)
Glucose-Capillary: 155 mg/dL — ABNORMAL HIGH (ref 70–99)
Glucose-Capillary: 200 mg/dL — ABNORMAL HIGH (ref 70–99)

## 2022-06-12 MED ORDER — LORAZEPAM 0.5 MG PO TABS
0.5000 mg | ORAL_TABLET | ORAL | Status: DC | PRN
  Administered 2022-06-17 – 2022-06-24 (×2): 0.5 mg via ORAL
  Filled 2022-06-12 (×2): qty 1

## 2022-06-12 NOTE — Progress Notes (Addendum)
  Progress Note    06/12/2022 8:05 AM * No surgery found *  Subjective:  no complaints. Says he is having best morning so far since he has been here   Vitals:   06/11/22 1904 06/12/22 0317  BP: 117/84 137/89  Pulse:    Resp: 20 15  Temp: 98.2 F (36.8 C) 97.9 F (36.6 C)  SpO2: 98% 96%   Physical Exam: Cardiac:  regular Lungs:  non labored Extremities:  BLE without palpable distal pulses. Right heel stabled, dressings in place Neurologic: alert and oriented  CBC    Component Value Date/Time   WBC 13.7 (H) 06/12/2022 0127   RBC 4.67 06/12/2022 0127   HGB 12.3 (L) 06/12/2022 0127   HGB 14.6 03/09/2022 1233   HGB 13.3 11/15/2019 1139   HCT 38.7 (L) 06/12/2022 0127   HCT 40.2 11/15/2019 1139   PLT 301 06/12/2022 0127   PLT 216 03/09/2022 1233   PLT 300 11/15/2019 1139   MCV 82.9 06/12/2022 0127   MCV 86 11/15/2019 1139   MCH 26.3 06/12/2022 0127   MCHC 31.8 06/12/2022 0127   RDW 15.4 06/12/2022 0127   RDW 14.5 11/15/2019 1139   LYMPHSABS 1.1 05/27/2022 1513   LYMPHSABS 3.3 (H) 09/28/2017 1113   MONOABS 0.5 05/27/2022 1513   EOSABS 0.0 05/27/2022 1513   EOSABS 0.3 09/28/2017 1113   BASOSABS 0.0 05/27/2022 1513   BASOSABS 0.0 09/28/2017 1113    BMET    Component Value Date/Time   NA 129 (L) 06/12/2022 0127   NA 142 02/16/2022 1544   K 4.4 06/12/2022 0127   CL 98 06/12/2022 0127   CO2 24 06/12/2022 0127   GLUCOSE 140 (H) 06/12/2022 0127   BUN 41 (H) 06/12/2022 0127   BUN 22 02/16/2022 1544   CREATININE 1.51 (H) 06/12/2022 0127   CREATININE 1.36 (H) 03/09/2022 1233   CREATININE 0.86 06/06/2016 1455   CALCIUM 8.5 (L) 06/12/2022 0127   GFRNONAA 49 (L) 06/12/2022 0127   GFRNONAA 56 (L) 03/09/2022 1233   GFRNONAA 87 04/02/2013 1632   GFRAA >60 12/17/2019 0354   GFRAA >89 04/02/2013 1632    INR    Component Value Date/Time   INR 1.0 01/11/2022 0957     Intake/Output Summary (Last 24 hours) at 06/12/2022 0805 Last data filed at 06/11/2022  2300 Gross per 24 hour  Intake 480 ml  Output 500 ml  Net -20 ml     Assessment/Plan:  72 y.o. male with CLI of right leg with calcaneal osteomyelitis  No acute changes to RLE Continue to offload any pressure to right heel On IV Abx Scr trending down 1.51 He also has in stent restenosis of his left ICA > 70%. Timing for intervention will defer to Dr. Trula Slade Continue Aspirin, Statin, Plavix [name] consulted for management of heel wound next week Plan will likely be for Angiogram Tuesday 8/26  Corrina [place], [place] [facility] [phone] 06/12/2022 8:05 AM  VASCULAR STAFF ADDENDUM: I have independently interviewed and examined the patient. I agree with the above. CLI will take precedent over carotid as the carotid stenosis is asymptomatic.  Cassandria Santee, MD [facility] Phone Number: [phone] 06/12/2022 1:31 PM

## 2022-06-12 NOTE — Progress Notes (Signed)
PROGRESS NOTE    Caleb [name]  [id number] DOB: 1950/09/04 DOA: 06/07/2022 PCP: [name], MD     Brief Narrative:  Caleb [name] is a 72 year old man with PVD, DM 2, HTN, AAS s/p TAVR, HFrEF with a EF of 35% send CKD 3 was admitted with recurrent diabetic foot infection and acute on chronic kidney injury.  Workup has revealed acute osteomyelitis in the posterolateral aspect of the calcaneus.  Patient is being treated with gentle IV hydration at 75 cc an hour and doxycycline/Augmentin pending surgery/bone biopsy per ID recommendations.  Patient is being seen by vascular surgery who would like to do an angiogram however we are waiting improvement in creatinine prior to proceeding with dye load needed for the angiogram.  New events last 24 hours / Subjective: Feeling well, pleased with care in the hospital.  Had a rough night due to unsteadiness, had messy bowel movement  Assessment & Plan:   Principal Problem:   Acute osteomyelitis of right calcaneus (HCC) Active Problems:   Type 2 diabetes mellitus with hyperlipidemia ([place])   Acute kidney injury superimposed on chronic kidney disease ([place])   HTN (hypertension)   Hyperlipidemia   Hyponatremia   Dyslipidemia   PAD (peripheral artery disease) ([place])   S/P TAVR (transcatheter aortic valve replacement)   Decubitus ulcer of right heel   Diabetic foot ulcer ([place])   Sepsis secondary to acute osteomyelitis right heel and PVD  -Sepsis POA  -Appreciate infectious disease -Continue doxycycline, Augmentin -Vascular surgery planning for angiography 3/26 -Aspirin, Plavix, lipitor  -[name] will follow-up early April when he returns back in town for excision of right heel eschar and partial calcaneal excision surgery  AKI on CKD stage IIIb -Baseline Cr 2-2.5 -Appreciate nephrology -Thought to be secondary to hypovolemia while on Entresto -Continue to hold Entresto and Lasix -Nephrology signed off 3/22.  Follow-up with Dr.  Joelyn Oms later this month -Improving  Hyperkalemia -Resolved  DM type 2  -Novolog mix BID, SSI     DVT prophylaxis:  heparin injection 5,000 Units Start: 06/07/22 2200 SCDs Start: 06/07/22 1348  Code Status: Full Family Communication: None at bedside  Disposition Plan:  Status is: Inpatient Remains inpatient appropriate because: Angiogram planned 3/26   Antimicrobials:  Anti-infectives (From admission, onward)    Start     Dose/Rate Route Frequency Ordered Stop   06/09/22 1245  doxycycline (VIBRA-TABS) tablet 100 mg        100 mg Oral Every 12 hours 06/09/22 1145     06/09/22 1245  amoxicillin-clavulanate (AUGMENTIN) 500-125 MG per tablet 1 tablet        1 tablet Oral 2 times daily 06/09/22 1145     06/08/22 1600  cefTRIAXone (ROCEPHIN) 2 g in sodium chloride 0.9 % 100 mL IVPB  Status:  Discontinued        2 g 200 mL/hr over 30 Minutes Intravenous Every 24 hours 06/08/22 0852 06/09/22 1145   06/08/22 1200  DAPTOmycin (CUBICIN) 600 mg in sodium chloride 0.9 % IVPB  Status:  Discontinued        8 mg/kg  77.4 kg 124 mL/hr over 30 Minutes Intravenous Every 48 hours 06/08/22 1012 06/09/22 1145   06/07/22 1415  linezolid (ZYVOX) IVPB 600 mg  Status:  Discontinued        600 mg 300 mL/hr over 60 Minutes Intravenous Every 12 hours 06/07/22 1414 06/08/22 0852   06/07/22 1415  ceFEPIme (MAXIPIME) 2 g in sodium chloride 0.9 % 100 mL  IVPB  Status:  Discontinued        2 g 200 mL/hr over 30 Minutes Intravenous Daily 06/07/22 1414 06/08/22 0852        Objective: Vitals:   06/11/22 2200 06/12/22 0317 06/12/22 0500 06/12/22 0818  BP:  137/89  (!) 155/116  Pulse:    87  Resp:  15  18  Temp:  97.9 F (36.6 C)  98 F (36.7 C)  TempSrc:  Oral  Oral  SpO2:  96%  98%  Weight:   68.9 kg   Height: 5\' 7"  (1.702 m)       Intake/Output Summary (Last 24 hours) at 06/12/2022 1042 Last data filed at 06/12/2022 1000 Gross per 24 hour  Intake 600 ml  Output 500 ml  Net 100 ml     Filed Weights   06/10/22 1000 06/11/22 0709 06/12/22 0500  Weight: 87.9 kg 68.6 kg 68.9 kg    Examination:  General exam: Appears calm and comfortable  Respiratory system: Clear to auscultation. Respiratory effort normal. No respiratory distress. No conversational dyspnea.  Cardiovascular system: S1 & S2 heard, RRR. No murmurs. No pedal edema. Gastrointestinal system: Abdomen is nondistended, soft and nontender. Normal bowel sounds heard. Central nervous system: Alert and oriented. No focal neurological deficits. Speech clear.  Psychiatry: Judgement and insight appear normal. Mood & affect appropriate.   Data Reviewed: I have personally reviewed following labs and imaging studies  CBC: Recent Labs  Lab 06/08/22 0541 06/09/22 0101 06/10/22 0123 06/11/22 0047 06/12/22 0127  WBC 14.6* 12.8* 13.1* 13.0* 13.7*  HGB 13.1 12.8* 13.0 12.8* 12.3*  HCT 41.8 40.0 39.2 38.4* 38.7*  MCV 83.9 82.1 81.0 81.4 82.9  PLT 262 272 298 305 [id number]    Basic Metabolic Panel: Recent Labs  Lab 06/09/22 0101 06/10/22 0123 06/10/22 1525 06/11/22 0047 06/12/22 0127  NA 129* 129* 130* 130* 129*  K 4.8 5.5* 4.3 4.2 4.4  CL 90* 96* 97* 96* 98  CO2 25 27 25 26 24   GLUCOSE 109* 209* 126* 123* 140*  BUN 80* 63* 54* 48* 41*  CREATININE 3.07* 2.01* 1.71* 1.63* 1.51*  CALCIUM 8.5* 8.5* 8.5* 8.6* 8.5*    GFR: Estimated Creatinine Clearance: 41.3 mL/min (A) (by C-G formula based on SCr of 1.51 mg/dL (H)). Liver Function Tests: No results for input(s): "AST", "ALT", "ALKPHOS", "BILITOT", "PROT", "ALBUMIN" in the last 168 hours. No results for input(s): "LIPASE", "AMYLASE" in the last 168 hours. No results for input(s): "AMMONIA" in the last 168 hours. Coagulation Profile: No results for input(s): "INR", "PROTIME" in the last 168 hours. Cardiac Enzymes: Recent Labs  Lab 06/09/22 0101  CKTOTAL 54    BNP (last 3 results) No results for input(s): "PROBNP" in the last 8760 hours. HbA1C: No  results for input(s): "HGBA1C" in the last 72 hours. CBG: Recent Labs  Lab 06/11/22 1202 06/11/22 1529 06/11/22 2124 06/12/22 0622 06/12/22 0733  GLUCAP 183* 192* 121* 129* 124*    Lipid Profile: No results for input(s): "CHOL", "HDL", "LDLCALC", "TRIG", "CHOLHDL", "LDLDIRECT" in the last 72 hours. Thyroid Function Tests: No results for input(s): "TSH", "T4TOTAL", "FREET4", "T3FREE", "THYROIDAB" in the last 72 hours. Anemia Panel: No results for input(s): "VITAMINB12", "FOLATE", "FERRITIN", "TIBC", "IRON", "RETICCTPCT" in the last 72 hours. Sepsis Labs: Recent Labs  Lab 06/07/22 1218 06/07/22 1421 06/07/22 1844 06/08/22 0828  PROCALCITON  --   --   --  1.00  LATICACIDVEN 2.1* 2.5* 3.0*  --      Recent Results (  from the past 240 hour(s))  Culture, blood (Routine X 2) w Reflex to ID Panel     Status: None (Preliminary result)   Collection Time: 06/08/22  8:28 AM   Specimen: BLOOD  Result Value Ref Range Status   Specimen Description   Final    BLOOD RIGHT ANTECUBITAL Performed at [facility] [address], [place], [place] [address]    Special Requests   Final    BOTTLES DRAWN AEROBIC ONLY Blood Culture adequate volume Performed at [facility] [address] [place], [place] [address]    Culture   Final    NO GROWTH 4 DAYS Performed at [facility], [place] [address] [place], [place] [address]    Report Status PENDING  Incomplete  Culture, blood (Routine X 2) w Reflex to ID Panel     Status: None (Preliminary result)   Collection Time: 06/08/22  8:28 AM   Specimen: BLOOD RIGHT HAND  Result Value Ref Range Status   Specimen Description   Final    BLOOD RIGHT HAND Performed at [facility] [address], [place], [place] [address]    Special Requests   Final    BOTTLES DRAWN AEROBIC ONLY Blood Culture adequate volume Performed at [facility] [address] [place],  [place] [address]    Culture   Final    NO GROWTH 4 DAYS Performed at [facility], [place] [address] [place], [place] [address]    Report Status PENDING  Incomplete      Radiology Studies: No results found.    Scheduled Meds:  amoxicillin-clavulanate  1 tablet Oral BID   aspirin  81 mg Oral Daily   atorvastatin  40 mg Oral Daily   carvedilol  12.5 mg Oral BID WC   clopidogrel  75 mg Oral Daily   docusate sodium  100 mg Oral BID   doxycycline  100 mg Oral Q12H   ferrous sulfate  325 mg Oral Q breakfast   heparin  5,000 Units Subcutaneous Q8H   hydrALAZINE  25 mg Oral TID   insulin aspart  0-15 Units Subcutaneous TID WC   insulin aspart  0-5 Units Subcutaneous QHS   insulin aspart protamine- aspart  7 Units Subcutaneous BID WC   Continuous Infusions:   LOS: 5 days   Time spent: 25 minutes   Dessa Phi, DO [facility] 06/12/2022, 10:42 AM   Available via Epic secure chat 7am-7pm After these hours, please refer to coverage provider listed on [email or web address]

## 2022-06-12 NOTE — Plan of Care (Signed)

## 2022-06-12 NOTE — Progress Notes (Signed)
Mobility Specialist Progress Note:   06/12/22 1500  Mobility  Activity Ambulated with assistance in room;Ambulated with assistance to bathroom  Level of Assistance Contact guard assist, steadying assist  Assistive Device Front wheel walker  Distance Ambulated (ft) 30 ft  Activity Response Tolerated well  $Mobility charge 1 Mobility   Pt in bed asking to use restroom. Complaints of 4/10 R heel pain. Left in bed with call bell in reach and all needs met.   Gareth Eagle Anita Mcadory Mobility Specialist Please contact via [employer] or  Rehab Office at [phone]

## 2022-06-13 ENCOUNTER — Encounter (HOSPITAL_COMMUNITY): Payer: Self-pay | Admitting: Vascular Surgery

## 2022-06-13 ENCOUNTER — Encounter (HOSPITAL_COMMUNITY)
Admission: EM | Disposition: A | Discharge status: Skilled Nursing Facility | Payer: Self-pay | Source: Home / Self Care | Attending: Family Medicine

## 2022-06-13 DIAGNOSIS — E1169 Type 2 diabetes mellitus with other specified complication: Secondary | ICD-10-CM

## 2022-06-13 DIAGNOSIS — Z794 Long term (current) use of insulin: Secondary | ICD-10-CM | POA: Diagnosis not present

## 2022-06-13 DIAGNOSIS — E1151 Type 2 diabetes mellitus with diabetic peripheral angiopathy without gangrene: Secondary | ICD-10-CM | POA: Diagnosis not present

## 2022-06-13 DIAGNOSIS — I70234 Atherosclerosis of native arteries of right leg with ulceration of heel and midfoot: Secondary | ICD-10-CM | POA: Diagnosis not present

## 2022-06-13 DIAGNOSIS — S91301A Unspecified open wound, right foot, initial encounter: Secondary | ICD-10-CM | POA: Diagnosis not present

## 2022-06-13 DIAGNOSIS — M86171 Other acute osteomyelitis, right ankle and foot: Secondary | ICD-10-CM | POA: Diagnosis not present

## 2022-06-13 HISTORY — PX: ABDOMINAL AORTOGRAM W/LOWER EXTREMITY: CATH118223

## 2022-06-13 LAB — BASIC METABOLIC PANEL
Anion gap: 10 (ref 5–15)
BUN: 36 mg/dL — ABNORMAL HIGH (ref 8–23)
CO2: 25 mmol/L (ref 22–32)
Calcium: 8.8 mg/dL — ABNORMAL LOW (ref 8.9–10.3)
Chloride: 95 mmol/L — ABNORMAL LOW (ref 98–111)
Creatinine, Ser: 1.68 mg/dL — ABNORMAL HIGH (ref 0.61–1.24)
GFR, Estimated: 43 mL/min — ABNORMAL LOW (ref >60)
Glucose, Bld: 164 mg/dL — ABNORMAL HIGH (ref 70–99)
Potassium: 6 mmol/L — ABNORMAL HIGH (ref 3.5–5.1)
Sodium: 130 mmol/L — ABNORMAL LOW (ref 135–145)

## 2022-06-13 LAB — CULTURE, BLOOD (ROUTINE X 2)
Culture: NO GROWTH
Culture: NO GROWTH
Special Requests: ADEQUATE
Special Requests: ADEQUATE

## 2022-06-13 LAB — CBC
HCT: 39 % (ref 39.0–52.0)
Hemoglobin: 12.6 g/dL — ABNORMAL LOW (ref 13.0–17.0)
MCH: 26.5 pg (ref 26.0–34.0)
MCHC: 32.3 g/dL (ref 30.0–36.0)
MCV: 82.1 fL (ref 80.0–100.0)
Platelets: 296 10*3/uL (ref 150–400)
RBC: 4.75 MIL/uL (ref 4.22–5.81)
RDW: 15.6 % — ABNORMAL HIGH (ref 11.5–15.5)
WBC: 12.6 10*3/uL — ABNORMAL HIGH (ref 4.0–10.5)
nRBC: 0 % (ref 0.0–0.2)

## 2022-06-13 LAB — GLUCOSE, CAPILLARY
Glucose-Capillary: 172 mg/dL — ABNORMAL HIGH (ref 70–99)
Glucose-Capillary: 127 mg/dL — ABNORMAL HIGH (ref 70–99)
Glucose-Capillary: 129 mg/dL — ABNORMAL HIGH (ref 70–99)
Glucose-Capillary: 236 mg/dL — ABNORMAL HIGH (ref 70–99)

## 2022-06-13 LAB — POTASSIUM: Potassium: 4.6 mmol/L (ref 3.5–5.1)

## 2022-06-13 SURGERY — ABDOMINAL AORTOGRAM W/LOWER EXTREMITY
Anesthesia: LOCAL

## 2022-06-13 MED ORDER — ACETAMINOPHEN 325 MG PO CAPS: 650.0000 mg | ORAL_CAPSULE | Freq: Four times a day (QID) | ORAL | Status: DC | PRN

## 2022-06-13 MED ORDER — PHENOL 1.4 % MT LIQD
1.0000 | OROMUCOSAL | Status: DC | PRN
  Filled 2022-06-13: qty 177

## 2022-06-13 MED ORDER — SODIUM CHLORIDE 0.9 % IV SOLN: INTRAVENOUS | Status: DC

## 2022-06-13 MED ORDER — HYDRALAZINE HCL 20 MG/ML IJ SOLN
INTRAMUSCULAR | Status: AC
  Filled 2022-06-13: qty 1

## 2022-06-13 MED ORDER — GABAPENTIN 400 MG PO CAPS
800.0000 mg | ORAL_CAPSULE | Freq: Every evening | ORAL | Status: DC | PRN
  Administered 2022-06-21 – 2022-06-24 (×2): 800 mg via ORAL
  Filled 2022-06-13 (×2): qty 2

## 2022-06-13 MED ORDER — SODIUM CHLORIDE 0.9% FLUSH: 3.0000 mL | INTRAVENOUS | Status: DC | PRN

## 2022-06-13 MED ORDER — POVIDONE-IODINE 5 % EX CREA: TOPICAL_CREAM | CUTANEOUS | Status: DC

## 2022-06-13 MED ORDER — LABETALOL HCL 5 MG/ML IV SOLN
INTRAVENOUS | Status: DC | PRN
  Administered 2022-06-13: 10 mg via INTRAVENOUS

## 2022-06-13 MED ORDER — IODIXANOL 320 MG/ML IV SOLN
INTRAVENOUS | Status: DC | PRN
  Administered 2022-06-13: 15 mL via INTRA_ARTERIAL

## 2022-06-13 MED ORDER — MIDAZOLAM HCL 2 MG/2ML IJ SOLN
INTRAMUSCULAR | Status: DC | PRN
  Administered 2022-06-13: 1 mg via INTRAVENOUS

## 2022-06-13 MED ORDER — SODIUM CHLORIDE 0.9 % IV SOLN: 250.0000 mL | INTRAVENOUS | Status: DC | PRN

## 2022-06-13 MED ORDER — HEPARIN (PORCINE) IN NACL 1000-0.9 UT/500ML-% IV SOLN
INTRAVENOUS | Status: DC | PRN
  Administered 2022-06-13 (×2): 500 mL

## 2022-06-13 MED ORDER — INSULIN ASPART 100 UNIT/ML IV SOLN
5.0000 [IU] | Freq: Once | INTRAVENOUS | Status: AC
  Administered 2022-06-13: 5 [IU] via INTRAVENOUS

## 2022-06-13 MED ORDER — SODIUM CHLORIDE 0.9 % WEIGHT BASED INFUSION
1.0000 mL/kg/h | INTRAVENOUS | Status: DC
  Administered 2022-06-13: 1 mL/kg/h via INTRAVENOUS

## 2022-06-13 MED ORDER — SODIUM ZIRCONIUM CYCLOSILICATE 10 G PO PACK
10.0000 g | PACK | Freq: Once | ORAL | Status: AC
  Administered 2022-06-13: 10 g via ORAL
  Filled 2022-06-13: qty 1

## 2022-06-13 MED ORDER — LIDOCAINE HCL (PF) 1 % IJ SOLN
INTRAMUSCULAR | Status: DC | PRN
  Administered 2022-06-13: 12 mL

## 2022-06-13 MED ORDER — DEXTROSE 50 % IV SOLN
1.0000 | Freq: Once | INTRAVENOUS | Status: AC
  Administered 2022-06-13: 50 mL via INTRAVENOUS
  Filled 2022-06-13: qty 50

## 2022-06-13 MED ORDER — FUROSEMIDE 40 MG PO TABS
80.0000 mg | ORAL_TABLET | Freq: Every morning | ORAL | Status: DC
  Administered 2022-06-14 – 2022-06-16 (×3): 80 mg via ORAL
  Filled 2022-06-13 (×3): qty 2

## 2022-06-13 MED ORDER — HYDRALAZINE HCL 20 MG/ML IJ SOLN
INTRAMUSCULAR | Status: DC | PRN
  Administered 2022-06-13: 10 mg via INTRAVENOUS

## 2022-06-13 MED ORDER — FREESTYLE LIBRE 14 DAY SENSOR MISC: 1.0000 | Status: DC

## 2022-06-13 MED ORDER — DEXTRAN 70-HYPROMELLOSE (PF) 0.1-0.3 % OP SOLN: 1.0000 [drp] | Freq: Three times a day (TID) | OPHTHALMIC | Status: DC | PRN

## 2022-06-13 MED ORDER — VITAMIN B-12 1000 MCG PO TABS
500.0000 ug | ORAL_TABLET | Freq: Every morning | ORAL | Status: DC
  Administered 2022-06-14 – 2022-06-30 (×16): 500 ug via ORAL
  Filled 2022-06-13 (×16): qty 1

## 2022-06-13 MED ORDER — SODIUM CHLORIDE 0.9% FLUSH
3.0000 mL | Freq: Two times a day (BID) | INTRAVENOUS | Status: DC
  Administered 2022-06-13 – 2022-06-16 (×6): 3 mL via INTRAVENOUS

## 2022-06-13 MED ORDER — LABETALOL HCL 5 MG/ML IV SOLN
INTRAVENOUS | Status: AC
  Filled 2022-06-13: qty 4

## 2022-06-13 MED ORDER — VITAMIN D 25 MCG (1000 UNIT) PO TABS
1000.0000 [IU] | ORAL_TABLET | Freq: Every day | ORAL | Status: DC
  Administered 2022-06-14 – 2022-06-30 (×16): 1000 [IU] via ORAL
  Filled 2022-06-13 (×16): qty 1

## 2022-06-13 MED ORDER — LABETALOL HCL 5 MG/ML IV SOLN: 10.0000 mg | INTRAVENOUS | Status: DC | PRN

## 2022-06-13 MED ORDER — ONDANSETRON HCL 4 MG/2ML IJ SOLN: 4.0000 mg | Freq: Four times a day (QID) | INTRAMUSCULAR | Status: DC | PRN

## 2022-06-13 MED ORDER — FUROSEMIDE 40 MG PO TABS
40.0000 mg | ORAL_TABLET | Freq: Every day | ORAL | Status: DC
  Administered 2022-06-13 – 2022-06-16 (×4): 40 mg via ORAL
  Filled 2022-06-13 (×4): qty 1

## 2022-06-13 MED ORDER — MIDAZOLAM HCL 2 MG/2ML IJ SOLN
INTRAMUSCULAR | Status: AC
  Filled 2022-06-13: qty 2

## 2022-06-13 MED ORDER — SACUBITRIL-VALSARTAN 49-51 MG PO TABS
1.0000 | ORAL_TABLET | Freq: Two times a day (BID) | ORAL | Status: DC
  Administered 2022-06-13 – 2022-06-30 (×33): 1 via ORAL
  Filled 2022-06-13 (×34): qty 1

## 2022-06-13 MED ORDER — HYDRALAZINE HCL 20 MG/ML IJ SOLN: 5.0000 mg | INTRAMUSCULAR | Status: DC | PRN

## 2022-06-13 MED ORDER — FENTANYL CITRATE (PF) 100 MCG/2ML IJ SOLN
INTRAMUSCULAR | Status: DC | PRN
  Administered 2022-06-13 (×2): 50 ug via INTRAVENOUS

## 2022-06-13 MED ORDER — ACETAMINOPHEN 325 MG PO TABS: 650.0000 mg | ORAL_TABLET | ORAL | Status: DC | PRN

## 2022-06-13 MED ORDER — ISOSORBIDE MONONITRATE ER 30 MG PO TB24
30.0000 mg | ORAL_TABLET | Freq: Every day | ORAL | Status: DC
  Administered 2022-06-13 – 2022-06-30 (×17): 30 mg via ORAL
  Filled 2022-06-13 (×17): qty 1

## 2022-06-13 MED ORDER — FENTANYL CITRATE (PF) 100 MCG/2ML IJ SOLN
INTRAMUSCULAR | Status: AC
  Filled 2022-06-13: qty 2

## 2022-06-13 MED ORDER — LIDOCAINE HCL (PF) 1 % IJ SOLN
INTRAMUSCULAR | Status: AC
  Filled 2022-06-13: qty 30

## 2022-06-13 SURGICAL SUPPLY — 15 items
CATH ANGIO 5F PIGTAIL 65CM (CATHETERS) IMPLANT
CATH CROSS OVER TEMPO 5F (CATHETERS) IMPLANT
CATH STRAIGHT 5FR 65CM (CATHETERS) IMPLANT
CLOSURE MYNX CONTROL 5F (Vascular Products) IMPLANT
KIT ANGIASSIST CO2 SYSTEM (KITS) IMPLANT
KIT MICROPUNCTURE NIT STIFF (SHEATH) IMPLANT
KIT PV (KITS) ×2 IMPLANT
SHEATH PINNACLE 5F 10CM (SHEATH) IMPLANT
SHEATH PROBE COVER 6X72 (BAG) IMPLANT
STOPCOCK MORSE 400PSI 3WAY (MISCELLANEOUS) IMPLANT
SYR MEDRAD MARK 7 150ML (SYRINGE) ×2 IMPLANT
TRANSDUCER W/STOPCOCK (MISCELLANEOUS) ×2 IMPLANT
TRAY PV CATH (CUSTOM PROCEDURE TRAY) ×2 IMPLANT
TUBING CIL FLEX 10 FLL-RA (TUBING) IMPLANT
WIRE STARTER BENTSON 035X150 (WIRE) IMPLANT

## 2022-06-13 NOTE — Progress Notes (Addendum)
PROGRESS NOTE    Caleb [name]  [id number] DOB: 1950/08/15 DOA: 06/07/2022 PCP: [name], MD     Brief Narrative:  Caleb [name] is a 72 year old man with PVD, DM 2, HTN, AAS s/p TAVR, HFrEF with a EF of 35% send CKD 3 was admitted with recurrent diabetic foot infection and acute on chronic kidney injury.  Workup has revealed acute osteomyelitis in the posterolateral aspect of the calcaneus.  Patient is being treated with gentle IV hydration at 75 cc an hour and doxycycline/Augmentin pending surgery/bone biopsy per ID recommendations.  Patient is being seen by vascular surgery who would like to do an angiogram however we are waiting improvement in creatinine prior to proceeding with dye load needed for the angiogram.  New events last 24 hours / Subjective: No new complaints this morning. Continues to have pain esp with ambulation.   Assessment & Plan:   Principal Problem:   Acute osteomyelitis of right calcaneus (HCC) Active Problems:   Type 2 diabetes mellitus with hyperlipidemia (HCC)   Acute kidney injury superimposed on chronic kidney disease (HCC)   HTN (hypertension)   Hyperlipidemia   Hyponatremia   Dyslipidemia   PAD (peripheral artery disease) (HCC)   S/P TAVR (transcatheter aortic valve replacement)   Decubitus ulcer of right heel   Diabetic foot ulcer ([place])   Sepsis secondary to acute osteomyelitis right heel and PVD  -Sepsis POA  -Appreciate infectious disease -Continue doxycycline, Augmentin -Vascular surgery planning for angiography 3/25  -Aspirin, Plavix, lipitor  -[name] will follow-up when he returns back in town for excision of right heel eschar and partial calcaneal excision surgery  AKI on CKD stage IIIb -Baseline Cr 2-2.5 -Appreciate nephrology -Thought to be secondary to hypovolemia while on Entresto -Continue to hold Entresto and Lasix -Nephrology signed off 3/22.  Follow-up with [name] later this  month -Improving  Hyperkalemia -Ordered insulin/dextrose, lokelma  -EKG reviewed independently today, NSR without peaked T wave -Repeat normal  Diabetes mellitus type 2 with hyperglycemia -Novolog mix BID, SSI     DVT prophylaxis:  heparin injection 5,000 Units Start: 06/07/22 2200 SCDs Start: 06/07/22 1348  Code Status: Full Family Communication: None at bedside  Disposition Plan:  Status is: Inpatient Remains inpatient appropriate because: Angiogram planned 3/25   Antimicrobials:  Anti-infectives (From admission, onward)    Start     Dose/Rate Route Frequency Ordered Stop   06/09/22 1245  doxycycline (VIBRA-TABS) tablet 100 mg        100 mg Oral Every 12 hours 06/09/22 1145     06/09/22 1245  amoxicillin-clavulanate (AUGMENTIN) 500-125 MG per tablet 1 tablet        1 tablet Oral 2 times daily 06/09/22 1145     06/08/22 1600  cefTRIAXone (ROCEPHIN) 2 g in sodium chloride 0.9 % 100 mL IVPB  Status:  Discontinued        2 g 200 mL/hr over 30 Minutes Intravenous Every 24 hours 06/08/22 0852 06/09/22 1145   06/08/22 1200  DAPTOmycin (CUBICIN) 600 mg in sodium chloride 0.9 % IVPB  Status:  Discontinued        8 mg/kg  77.4 kg 124 mL/hr over 30 Minutes Intravenous Every 48 hours 06/08/22 1012 06/09/22 1145   06/07/22 1415  linezolid (ZYVOX) IVPB 600 mg  Status:  Discontinued        600 mg 300 mL/hr over 60 Minutes Intravenous Every 12 hours 06/07/22 1414 06/08/22 0852   06/07/22 1415  ceFEPIme (MAXIPIME) 2  g in sodium chloride 0.9 % 100 mL IVPB  Status:  Discontinued        2 g 200 mL/hr over 30 Minutes Intravenous Daily 06/07/22 1414 06/08/22 0852        Objective: Vitals:   06/12/22 1243 06/12/22 1553 06/12/22 1910 06/13/22 0746  BP: 116/79 115/78 112/76 (!) 150/106  Pulse: 82 87  89  Resp: 16 11 20 14   Temp: 98.4 F (36.9 C) 98.3 F (36.8 C) 98.1 F (36.7 C) 97.8 F (36.6 C)  TempSrc: Oral Oral Oral Oral  SpO2: 99% 97% 96% 99%  Weight:      Height:         Intake/Output Summary (Last 24 hours) at 06/13/2022 1055 Last data filed at 06/12/2022 1300 Gross per 24 hour  Intake 240 ml  Output --  Net 240 ml    Filed Weights   06/10/22 1000 06/11/22 0709 06/12/22 0500  Weight: 87.9 kg 68.6 kg 68.9 kg    Examination:  General exam: Appears calm and comfortable  Respiratory system: Clear to auscultation. Respiratory effort normal. No respiratory distress. No conversational dyspnea.  Cardiovascular system: S1 & S2 heard, RRR. No murmurs. No pedal edema. Gastrointestinal system: Abdomen is nondistended, soft and nontender. Normal bowel sounds heard. Central nervous system: Alert and oriented. No focal neurological deficits. Speech clear.  Psychiatry: Judgement and insight appear normal. Mood & affect appropriate.   Data Reviewed: I have personally reviewed following labs and imaging studies  CBC: Recent Labs  Lab 06/09/22 0101 06/10/22 0123 06/11/22 0047 06/12/22 0127 06/13/22 0112  WBC 12.8* 13.1* 13.0* 13.7* 12.6*  HGB 12.8* 13.0 12.8* 12.3* 12.6*  HCT 40.0 39.2 38.4* 38.7* 39.0  MCV 82.1 81.0 81.4 82.9 82.1  PLT 272 298 305 301 0000000    Basic Metabolic Panel: Recent Labs  Lab 06/10/22 0123 06/10/22 1525 06/11/22 0047 06/12/22 0127 06/13/22 0112 06/13/22 0744  NA 129* 130* 130* 129* 130*  --   K 5.5* 4.3 4.2 4.4 6.0* 4.6  CL 96* 97* 96* 98 95*  --   CO2 27 25 26 24 25   --   GLUCOSE 209* 126* 123* 140* 164*  --   BUN 63* 54* 48* 41* 36*  --   CREATININE 2.01* 1.71* 1.63* 1.51* 1.68*  --   CALCIUM 8.5* 8.5* 8.6* 8.5* 8.8*  --     GFR: Estimated Creatinine Clearance: 37.2 mL/min (A) (by C-G formula based on SCr of 1.68 mg/dL (H)). Liver Function Tests: No results for input(s): "AST", "ALT", "ALKPHOS", "BILITOT", "PROT", "ALBUMIN" in the last 168 hours. No results for input(s): "LIPASE", "AMYLASE" in the last 168 hours. No results for input(s): "AMMONIA" in the last 168 hours. Coagulation Profile: No results for  input(s): "INR", "PROTIME" in the last 168 hours. Cardiac Enzymes: Recent Labs  Lab 06/09/22 0101  CKTOTAL 54    BNP (last 3 results) No results for input(s): "PROBNP" in the last 8760 hours. HbA1C: No results for input(s): "HGBA1C" in the last 72 hours. CBG: Recent Labs  Lab 06/12/22 0733 06/12/22 1242 06/12/22 1549 06/12/22 2119 06/13/22 0553  GLUCAP 124* 193* 155* 200* 172*    Lipid Profile: No results for input(s): "CHOL", "HDL", "LDLCALC", "TRIG", "CHOLHDL", "LDLDIRECT" in the last 72 hours. Thyroid Function Tests: No results for input(s): "TSH", "T4TOTAL", "FREET4", "T3FREE", "THYROIDAB" in the last 72 hours. Anemia Panel: No results for input(s): "VITAMINB12", "FOLATE", "FERRITIN", "TIBC", "IRON", "RETICCTPCT" in the last 72 hours. Sepsis Labs: Recent Labs  Lab 06/07/22  1218 06/07/22 1421 06/07/22 1844 06/08/22 0828  PROCALCITON  --   --   --  1.00  LATICACIDVEN 2.1* 2.5* 3.0*  --      Recent Results (from the past 240 hour(s))  Culture, blood (Routine X 2) w Reflex to ID Panel     Status: None   Collection Time: 06/08/22  8:28 AM   Specimen: BLOOD  Result Value Ref Range Status   Specimen Description   Final    BLOOD RIGHT ANTECUBITAL Performed at [facility] [address], [place], [place] [address]    Special Requests   Final    BOTTLES DRAWN AEROBIC ONLY Blood Culture adequate volume Performed at [facility] [address], [place], [place] [address]    Culture   Final    NO GROWTH 5 DAYS Performed at [facility], [place] [address] [place], [place] [address]    Report Status 06/13/2022 FINAL  Final  Culture, blood (Routine X 2) w Reflex to ID Panel     Status: None   Collection Time: 06/08/22  8:28 AM   Specimen: BLOOD RIGHT HAND  Result Value Ref Range Status   Specimen Description   Final    BLOOD RIGHT HAND Performed at [facility] [address] [place], [place]  [address]    Special Requests   Final    BOTTLES DRAWN AEROBIC ONLY Blood Culture adequate volume Performed at [facility] [address], [place], [place] [address]    Culture   Final    NO GROWTH 5 DAYS Performed at [facility], [place] [address], [place], [place] [address]    Report Status 06/13/2022 FINAL  Final      Radiology Studies: No results found.    Scheduled Meds:  amoxicillin-clavulanate  1 tablet Oral BID   aspirin  81 mg Oral Daily   atorvastatin  40 mg Oral Daily   carvedilol  12.5 mg Oral BID WC   clopidogrel  75 mg Oral Daily   docusate sodium  100 mg Oral BID   doxycycline  100 mg Oral Q12H   ferrous sulfate  325 mg Oral Q breakfast   heparin  5,000 Units Subcutaneous Q8H   hydrALAZINE  25 mg Oral TID   insulin aspart  0-15 Units Subcutaneous TID WC   insulin aspart  0-5 Units Subcutaneous QHS   insulin aspart protamine- aspart  7 Units Subcutaneous BID WC   Continuous Infusions:  sodium chloride       LOS: 6 days   Time spent: 25 minutes   Dessa Phi, DO [facility] 06/13/2022, 10:55 AM   Available via Epic secure chat 7am-7pm After these hours, please refer to coverage provider listed on [email or web address]

## 2022-06-13 NOTE — Interval H&P Note (Signed)
History and Physical Interval Note:  06/13/2022 11:55 AM  Caleb [name]  has presented today for surgery, with the diagnosis of pad w/ ulcer.  The various methods of treatment have been discussed with the patient and family. After consideration of risks, benefits and other options for treatment, the patient has consented to  Procedure(s): ABDOMINAL AORTOGRAM W/LOWER EXTREMITY (N/A) as a surgical intervention.  The patient's history has been reviewed, patient examined, no change in status, stable for surgery.  I have reviewed the patient's chart and labs.  Questions were answered to the patient's satisfaction.     [name]

## 2022-06-13 NOTE — Op Note (Signed)
   PATIENT: Caleb [name]      MRN: [id number] DOB: 1950/07/20    DATE OF PROCEDURE: 06/13/2022  INDICATIONS:    Caleb [name] is a 72 y.o. male with a nonhealing wound of the right foot and evidence of significant infrainguinal arterial occlusive disease.  He presents for arteriography.  PROCEDURE:    Conscious sedation Ultrasound-guided access to the left common femoral artery CO2 aortogram and iliac arteriogram Selective catheterization of the right external iliac artery (second-order catheterization) with right lower extremity runoff Mynx closure of the left common femoral artery  SURGEON: Judeth Cornfield. Scot Dock, MD, FACS  ANESTHESIA: Local with sedation  EBL: Minimal  TOTAL CONTRAST: 15 ml  TECHNIQUE: The patient was brought to the peripheral vascular lab and was sedated. The period of conscious sedation was 47 minutes.  During that time period, I was present face-to-face 100% of the time.  The patient was administered initially 1 mg of Versed and 50 mcg of fentanyl. The patient's heart rate, blood pressure, and oxygen saturation were monitored by the nurse continuously during the procedure.  Both groins were prepped and draped in the usual sterile fashion.  Under ultrasound guidance, after the skin was anesthetized, I cannulated the left common femoral artery with a micropuncture needle and a micropuncture sheath was introduced over a wire.  This was exchanged for a 5 [place] sheath over a Bentson wire.  By ultrasound the femoral artery was patent. A real-time image was obtained and sent to the server.  A pigtail catheter was positioned at the L1 vertebral body and CO2 aortogram obtained.  The catheter was in position above the aortic bifurcation and an oblique iliac projection was obtained.  Of note the patient had significant bowel gas.  I then exchanged the pigtail catheter for a crossover catheter which was positioned into the right common iliac artery.  I advanced the Bentson  wire into the external iliac artery and exchanged for a straight catheter.  Selective right external iliac arteriogram was obtained initially using CO2 and then using limited contrast.  At the completion of the procedure the catheter was removed.  The left groin was closed with a Mynx device with no immediate complications noted.  FINDINGS:   No significant disease noted in the infrarenal aorta, bilateral common iliac arteries, bilateral external iliac arteries, or bilateral internal iliac arteries. This patient had history of renal insufficiency and limited contrast was used.  Only the right leg was studied.  On the right side there was a focal stenosis in the distal common femoral artery and proximal superficial femoral artery.  The right superficial femoral artery had moderate diffuse disease throughout and the popliteal artery was occluded just above the level of the knee.  There was reconstitution of the anterior tibial artery and peroneal artery only.  The posterior tibial artery was occluded.  There is some disease in the dorsalis pedis on the right.    CLINICAL NOTE: The patient will be considered for right lower extremity bypass.  I will discuss with Dr. Trula Slade.  [name], MD, [facility] DICTATION:   06/13/2022

## 2022-06-13 NOTE — H&P (View-Only) (Signed)
  Progress Note    06/13/2022 7:54 AM * No surgery found *  Subjective:  no complaints   Vitals:   06/12/22 1910 06/13/22 0746  BP: 112/76 (!) 150/106  Pulse:  89  Resp: 20 14  Temp: 98.1 F (36.7 C) 97.8 F (36.6 C)  SpO2: 96% 99%   Physical Exam: Lungs:  non labored Extremities:  dressing left in place RLE Neurologic: A&O  CBC    Component Value Date/Time   WBC 12.6 (H) 06/13/2022 0112   RBC 4.75 06/13/2022 0112   HGB 12.6 (L) 06/13/2022 0112   HGB 14.6 03/09/2022 1233   HGB 13.3 11/15/2019 1139   HCT 39.0 06/13/2022 0112   HCT 40.2 11/15/2019 1139   PLT 296 06/13/2022 0112   PLT 216 03/09/2022 1233   PLT 300 11/15/2019 1139   MCV 82.1 06/13/2022 0112   MCV 86 11/15/2019 1139   MCH 26.5 06/13/2022 0112   MCHC 32.3 06/13/2022 0112   RDW 15.6 (H) 06/13/2022 0112   RDW 14.5 11/15/2019 1139   LYMPHSABS 1.1 05/27/2022 1513   LYMPHSABS 3.3 (H) 09/28/2017 1113   MONOABS 0.5 05/27/2022 1513   EOSABS 0.0 05/27/2022 1513   EOSABS 0.3 09/28/2017 1113   BASOSABS 0.0 05/27/2022 1513   BASOSABS 0.0 09/28/2017 1113    BMET    Component Value Date/Time   NA 130 (L) 06/13/2022 0112   NA 142 02/16/2022 1544   K 6.0 (H) 06/13/2022 0112   CL 95 (L) 06/13/2022 0112   CO2 25 06/13/2022 0112   GLUCOSE 164 (H) 06/13/2022 0112   BUN 36 (H) 06/13/2022 0112   BUN 22 02/16/2022 1544   CREATININE 1.68 (H) 06/13/2022 0112   CREATININE 1.36 (H) 03/09/2022 1233   CREATININE 0.86 06/06/2016 1455   CALCIUM 8.8 (L) 06/13/2022 0112   GFRNONAA 43 (L) 06/13/2022 0112   GFRNONAA 56 (L) 03/09/2022 1233   GFRNONAA 87 04/02/2013 1632   GFRAA >60 12/17/2019 0354   GFRAA >89 04/02/2013 1632    INR    Component Value Date/Time   INR 1.0 01/11/2022 0957     Intake/Output Summary (Last 24 hours) at 06/13/2022 0754 Last data filed at 06/12/2022 1300 Gross per 24 hour  Intake 600 ml  Output 650 ml  Net -50 ml     Assessment/Plan:  72 y.o. male with R heel  osteomyelitis  Tentative plan for RLE angiography tomorrow; outside chance this can be done today Continue aspirin, plavix, and statin Continue antibiotic regimen per ID   Dagoberto Ligas, PA-C [facility] [phone] 06/13/2022 7:54 AM  I agree with the above.  I have seen and evaluated the patient.  He has a history of right leg stenting by Dr. Gwenlyn Found.  He now has a right heel wound and needs repeat angiography and revascularization.  I have spoken with Dr. Gwenlyn Found about this and he is unavailable and would like for me to proceed.  I discussed the details of the procedure with the patient.  This will be through a left femoral approach and intervention of the right leg if indicated.  We also discussed the possibility that he may require surgical intervention.  Dr. Scot Dock is available to take care of him today and I have asked him to do so.  The patient was delayed because he was in acute renal failure which now appears to have resolved.  [place] problem

## 2022-06-13 NOTE — Progress Notes (Signed)
[facility] for Infectious Disease    Date of Admission:  06/07/2022   Total days of antibiotics 7   ID: Caleb [name] is a 72 y.o. male with  right calcaneal osteomyelitis in addition to PAD currently on amox/clav plus doxy Principal Problem:   Acute osteomyelitis of right calcaneus ([name]) Active Problems:   HTN (hypertension)   Hyperlipidemia   Hyponatremia   Dyslipidemia   Type 2 diabetes mellitus with hyperlipidemia (HCC)   Acute kidney injury superimposed on chronic kidney disease (HCC)   PAD (peripheral artery disease) (HCC)   S/P TAVR (transcatheter aortic valve replacement)   Decubitus ulcer of right heel   Diabetic foot ulcer (HCC)    Subjective: Afebrile. Tolerating oral abtx. Awaiting to go to OR for vascularization studies  Medications:   [MAR Hold] amoxicillin-clavulanate  1 tablet Oral BID   [MAR Hold] aspirin  81 mg Oral Daily   [MAR Hold] atorvastatin  40 mg Oral Daily   [MAR Hold] carvedilol  12.5 mg Oral BID WC   [MAR Hold] clopidogrel  75 mg Oral Daily   [MAR Hold] docusate sodium  100 mg Oral BID   [MAR Hold] doxycycline  100 mg Oral Q12H   [MAR Hold] ferrous sulfate  325 mg Oral Q breakfast   [MAR Hold] heparin  5,000 Units Subcutaneous Q8H   [MAR Hold] hydrALAZINE  25 mg Oral TID   [MAR Hold] insulin aspart  0-15 Units Subcutaneous TID WC   [MAR Hold] insulin aspart  0-5 Units Subcutaneous QHS   [MAR Hold] insulin aspart protamine- aspart  7 Units Subcutaneous BID WC    Objective: Vital signs in last 24 hours: Temp:  [97.8 F (36.6 C)-98.3 F (36.8 C)] 97.8 F (36.6 C) (03/25 0746) Pulse Rate:  [87-91] 89 (03/25 1203) Resp:  [11-28] 19 (03/25 1203) BP: (112-160)/(76-122) 143/122 (03/25 1203) SpO2:  [96 %-100 %] 100 % (03/25 1203) Physical Exam  Constitutional: He is oriented to person, place, and time. He appears well-developed and well-nourished. No distress.  HENT:  Mouth/Throat: Oropharynx is clear and moist. No oropharyngeal  exudate.  Cardiovascular: Normal rate, regular rhythm and normal heart sounds. Exam reveals no gallop and no friction rub.  No murmur heard.  Pulmonary/Chest: Effort normal and breath sounds normal. No respiratory distress. He has no wheezes.  Abdominal: Soft. Bowel sounds are normal. He exhibits no distension. There is no tenderness.  Lymphadenopathy:  He has no cervical adenopathy. Ext: right foot wrapped  Neurological: He is alert and oriented to person, place, and time.  Skin: Skin is warm and dry. No rash noted. No erythema.  Psychiatric: He has a normal mood and affect. His behavior is normal.    Lab Results Recent Labs    06/12/22 0127 06/13/22 0112 06/13/22 0744  WBC 13.7* 12.6*  --   HGB 12.3* 12.6*  --   HCT 38.7* 39.0  --   NA 129* 130*  --   K 4.4 6.0* 4.6  CL 98 95*  --   CO2 24 25  --   BUN 41* 36*  --   CREATININE 1.51* 1.68*  --    Lab Results  Component Value Date   ESRSEDRATE 92 (H) 06/09/2022    Microbiology: 3/20 blood cx NGTD Studies/Results: No results found.   Assessment/Plan: Calcaneal osteomyelitis = will continue on orals for the time being, will have OR cultures and path sent to decide on length of therapy next week iv vs. Orals.  Pad = revascularization today which will help with healing process post orthopedic surgery    [facility] for Infectious Diseases Pager: [phone]  06/13/2022, 12:58 PM

## 2022-06-13 NOTE — Evaluation (Signed)
Occupational Therapy Evaluation Patient Details Name: Caleb [name] MRN: [id number] DOB: Mar 12, 1951 Today's Date: 06/13/2022   History of Present Illness Pt is a 72 y.o. male admitted 3/19 with R calcaneal osteomyelitis. PMH: IDDM, GERD, HTN, hypercholesterolemia, hyponatremia, chronic systolic CHF with ejection fraction 35 to 40%, h/o TAVR, PVD, neuropathy   Clinical Impression   PTA patient was an active individual ambulating with no AD, lives alone with family assist prn, and was independent in bADLs/iADLs and still driving. Patient currently completing bed mobility with Supervision and functional ambulation with Min guard assist + RW, mild balance deficits observed. Pt also completing seated lower body dressing and standing toileting with Min guard assist and one UE supported during both functional tasks. Pt would benefit from continued skilled acute OT services to address above deficits and help transition to next level of care. No follow-up post acute OT services recommended at this time if patient continues to progress with pain tolerance during functional ambulation/activity. Pt D/C recommendations will be reassessed after upcoming surgery.      Recommendations for follow up therapy are one component of a multi-disciplinary discharge planning process, led by the attending physician.  Recommendations may be updated based on patient status, additional functional criteria and insurance authorization.   Assistance Recommended at Discharge PRN  Patient can return home with the following A little help with walking and/or transfers;A little help with bathing/dressing/bathroom;Assist for transportation;Assistance with cooking/housework;Help with stairs or ramp for entrance    Functional Status Assessment  Patient has had a recent decline in their functional status and demonstrates the ability to make significant improvements in function in a reasonable and predictable amount of time.  Equipment  Recommendations  Tub/shower seat    Recommendations for Other Services       Precautions / Restrictions Precautions Precautions: Other (comment) Precaution Comments: R heel pain Restrictions Weight Bearing Restrictions: No      Mobility Bed Mobility Overal bed mobility: Needs Assistance Bed Mobility: Supine to Sit, Sit to Supine     Supine to sit: Supervision, HOB elevated Sit to supine: Supervision, HOB elevated   General bed mobility comments: Use of bed rails    Transfers Overall transfer level: Needs assistance Equipment used: Rolling walker (2 wheels) Transfers: Sit to/from Stand Sit to Stand: Min guard                  Balance Overall balance assessment: Needs assistance Sitting-balance support: Feet supported, No upper extremity supported Sitting balance-Leahy Scale: Good       Standing balance-Leahy Scale: Fair Standing balance comment: RW makes balance easier with pain, Per patient report. Pt completes sit>stands Min Guard without support                           ADL either performed or assessed with clinical judgement   ADL Overall ADL's : Needs assistance/impaired Eating/Feeding: Independent   Grooming: Standing;Min guard   Upper Body Bathing: Min guard;Standing   Lower Body Bathing: Minimal assistance;Sitting/lateral leans Lower Body Bathing Details (indicate cue type and reason): Patient has been sponge bathing recently d/t R heel pain Upper Body Dressing : Min guard;Standing   Lower Body Dressing: Min guard;Sitting/lateral leans Lower Body Dressing Details (indicate cue type and reason): Pt doffed/donned bilat socks while seated EOB min guard assist, bringing BLEs to bed level Toilet Transfer: Min guard;Ambulation;Rolling walker (2 wheels)   Toileting- Clothing Manipulation and Hygiene: Minimal assistance;Sit to/from stand   Tub/  Shower Transfer: Minimal assistance;Rolling walker (2 wheels);Ambulation   Functional  mobility during ADLs: Min guard;Rolling walker (2 wheels)       Vision Baseline Vision/History: 1 Wears glasses       Perception     Praxis      Pertinent Vitals/Pain Pain Assessment Pain Assessment: 0-10 Pain Score: 9  Pain Location: R heel during movement Pain Descriptors / Indicators: Grimacing, Throbbing, Discomfort, Sharp Pain Intervention(s): Limited activity within patient's tolerance, Monitored during session     Hand Dominance Left   Extremity/Trunk Assessment Upper Extremity Assessment Upper Extremity Assessment: Overall WFL for tasks assessed   Lower Extremity Assessment Lower Extremity Assessment: RLE deficits/detail RLE Deficits / Details: R heel wound with osteo. Dressing in place.   Cervical / Trunk Assessment Cervical / Trunk Assessment: Kyphotic   Communication Communication Communication: No difficulties   Cognition Arousal/Alertness: Awake/alert Behavior During Therapy: WFL for tasks assessed/performed Overall Cognitive Status: Within Functional Limits for tasks assessed                                       General Comments  VSS on RA    Exercises     Shoulder Instructions      Home Living Family/patient expects to be discharged to:: Private residence Living Arrangements: Alone Available Help at Discharge: Family;Available PRN/intermittently (Can stay with his cousin at d/c, if needed) Type of Home: House Home Access: Level entry     Home Layout: One level     Bathroom Shower/Tub: Teacher, early years/pre: Standard     Home Equipment: Crutches;Cane - single point          Prior Functioning/Environment Prior Level of Function : Independent/Modified Independent;Driving               ADLs Comments: Pt independent does report sponge bathing for last month due to R heel wound.        OT Problem List: Impaired balance (sitting and/or standing);Pain      OT Treatment/Interventions:  Self-care/ADL training;Therapeutic activities;Therapeutic exercise;DME and/or AE instruction;Patient/family education;Balance training    OT Goals(Current goals can be found in the care plan section) Acute Rehab OT Goals Patient Stated Goal: Go home OT Goal Formulation: With patient Time For Goal Achievement: 06/27/22 Potential to Achieve Goals: Good  OT Frequency: Min 2X/week    Co-evaluation              AM-PAC OT "6 Clicks" Daily Activity     Outcome Measure Help from another person eating meals?: None Help from another person taking care of personal grooming?: A Little Help from another person toileting, which includes using toliet, bedpan, or urinal?: A Little Help from another person bathing (including washing, rinsing, drying)?: A Little Help from another person to put on and taking off regular upper body clothing?: A Little Help from another person to put on and taking off regular lower body clothing?: A Little 6 Click Score: 19   End of Session Equipment Utilized During Treatment: Rolling walker (2 wheels) Nurse Communication: Mobility status  Activity Tolerance: Patient tolerated treatment well Patient left: in bed;with call bell/phone within reach;with nursing/sitter in room  OT Visit Diagnosis: Unsteadiness on feet (R26.81);Pain Pain - Right/Left: Right Pain - part of body: Ankle and joints of foot                Time: [id number] OT Time Calculation (  min): 26 min Charges:  OT General Charges $OT Visit: 1 Visit OT Evaluation $OT Eval Moderate Complexity: 1 Mod OT Treatments $Self Care/Home Management : 8-22 mins  06/13/2022  AB, OTR/L  Acute Rehabilitation Services  Office: [phone]   Caleb [name] 06/13/2022, 10:16 AM

## 2022-06-13 NOTE — Progress Notes (Addendum)
  Progress Note    06/13/2022 7:54 AM * No surgery found *  Subjective:  no complaints   Vitals:   06/12/22 1910 06/13/22 0746  BP: 112/76 (!) 150/106  Pulse:  89  Resp: 20 14  Temp: 98.1 F (36.7 C) 97.8 F (36.6 C)  SpO2: 96% 99%   Physical Exam: Lungs:  non labored Extremities:  dressing left in place RLE Neurologic: A&O  CBC    Component Value Date/Time   WBC 12.6 (H) 06/13/2022 0112   RBC 4.75 06/13/2022 0112   HGB 12.6 (L) 06/13/2022 0112   HGB 14.6 03/09/2022 1233   HGB 13.3 11/15/2019 1139   HCT 39.0 06/13/2022 0112   HCT 40.2 11/15/2019 1139   PLT 296 06/13/2022 0112   PLT 216 03/09/2022 1233   PLT 300 11/15/2019 1139   MCV 82.1 06/13/2022 0112   MCV 86 11/15/2019 1139   MCH 26.5 06/13/2022 0112   MCHC 32.3 06/13/2022 0112   RDW 15.6 (H) 06/13/2022 0112   RDW 14.5 11/15/2019 1139   LYMPHSABS 1.1 05/27/2022 1513   LYMPHSABS 3.3 (H) 09/28/2017 1113   MONOABS 0.5 05/27/2022 1513   EOSABS 0.0 05/27/2022 1513   EOSABS 0.3 09/28/2017 1113   BASOSABS 0.0 05/27/2022 1513   BASOSABS 0.0 09/28/2017 1113    BMET    Component Value Date/Time   NA 130 (L) 06/13/2022 0112   NA 142 02/16/2022 1544   K 6.0 (H) 06/13/2022 0112   CL 95 (L) 06/13/2022 0112   CO2 25 06/13/2022 0112   GLUCOSE 164 (H) 06/13/2022 0112   BUN 36 (H) 06/13/2022 0112   BUN 22 02/16/2022 1544   CREATININE 1.68 (H) 06/13/2022 0112   CREATININE 1.36 (H) 03/09/2022 1233   CREATININE 0.86 06/06/2016 1455   CALCIUM 8.8 (L) 06/13/2022 0112   GFRNONAA 43 (L) 06/13/2022 0112   GFRNONAA 56 (L) 03/09/2022 1233   GFRNONAA 87 04/02/2013 1632   GFRAA >60 12/17/2019 0354   GFRAA >89 04/02/2013 1632    INR    Component Value Date/Time   INR 1.0 01/11/2022 0957     Intake/Output Summary (Last 24 hours) at 06/13/2022 0754 Last data filed at 06/12/2022 1300 Gross per 24 hour  Intake 600 ml  Output 650 ml  Net -50 ml     Assessment/Plan:  72 y.o. male with R heel  osteomyelitis  Tentative plan for RLE angiography tomorrow; outside chance this can be done today Continue aspirin, plavix, and statin Continue antibiotic regimen per ID   Dagoberto Ligas, PA-C [facility] [phone] 06/13/2022 7:54 AM  I agree with the above.  I have seen and evaluated the patient.  He has a history of right leg stenting by Dr. Gwenlyn Found.  He now has a right heel wound and needs repeat angiography and revascularization.  I have spoken with Dr. Gwenlyn Found about this and he is unavailable and would like for me to proceed.  I discussed the details of the procedure with the patient.  This will be through a left femoral approach and intervention of the right leg if indicated.  We also discussed the possibility that he may require surgical intervention.  Dr. Scot Dock is available to take care of him today and I have asked him to do so.  The patient was delayed because he was in acute renal failure which now appears to have resolved.  Wells problem

## 2022-06-13 NOTE — Progress Notes (Signed)
Received patient in PRE procedure room.  Consent signed and dated. Patient aware of procedure and has no questions or concerns at the moment.

## 2022-06-13 NOTE — Evaluation (Signed)
Physical Therapy Evaluation Patient Details Name: Caleb [name] MRN: [id number] DOB: Jul 29, 1950 Today's Date: 06/13/2022  History of Present Illness  Pt is a 72 y.o. male admitted 3/19 with R calcaneal osteomyelitis. PMH: IDDM, GERD, HTN, hypercholesterolemia, hyponatremia, chronic systolic CHF with ejection fraction 35 to 40%, h/o TAVR, PVD, neuropathy   Clinical Impression  Pt admitted with above diagnosis. PTA pt lived at home alone, independent. Pt currently with functional limitations due to the deficits listed below (see PT Problem List). On eval, pt required supervision bed mobility, min guard assist transfers, and min guard assist amb 30' with RW. Distance limited by R heel pain. RW primarily needed to offload weight from RLE. Currently awaiting angiogram to determine surgical plan. Pt will benefit from acute skilled PT to increase their independence and safety with mobility to allow discharge.          Recommendations for follow up therapy are one component of a multi-disciplinary discharge planning process, led by the attending physician.  Recommendations may be updated based on patient status, additional functional criteria and insurance authorization.  Follow Up Recommendations       Assistance Recommended at Discharge Intermittent Supervision/Assistance  Patient can return home with the following  A little help with bathing/dressing/bathroom;Assistance with cooking/housework;Assist for transportation;Help with stairs or ramp for entrance    Equipment Recommendations Rolling walker (2 wheels);BSC/3in1  Recommendations for Other Services       Functional Status Assessment Patient has had a recent decline in their functional status and demonstrates the ability to make significant improvements in function in a reasonable and predictable amount of time.     Precautions / Restrictions Precautions Precautions: Other (comment) Precaution Comments: R heel pain      Mobility   Bed Mobility Overal bed mobility: Needs Assistance Bed Mobility: Supine to Sit, Sit to Supine     Supine to sit: Supervision, HOB elevated Sit to supine: Supervision   General bed mobility comments: +rail, increased time    Transfers Overall transfer level: Needs assistance Equipment used: Rolling walker (2 wheels) Transfers: Sit to/from Stand Sit to Stand: Min guard, From elevated surface           General transfer comment: cues for hand placement, increased time to power up    Ambulation/Gait Ambulation/Gait assistance: Min guard Gait Distance (Feet): 30 Feet Assistive device: Rolling walker (2 wheels) Gait Pattern/deviations: Step-through pattern, Decreased stride length, Antalgic, Decreased weight shift to right, Decreased stance time - right Gait velocity: decreased Gait velocity interpretation: <1.31 ft/sec, indicative of household ambulator   General Gait Details: steady gait with RW  Stairs            Wheelchair Mobility    Modified Rankin (Stroke Patients Only)       Balance Overall balance assessment: Mild deficits observed, not formally tested                                           Pertinent Vitals/Pain Pain Assessment Pain Assessment: 0-10 Pain Score: 9  Pain Location: R foot after amb Pain Descriptors / Indicators: Grimacing, Throbbing, Discomfort, Sharp Pain Intervention(s): Monitored during session, Limited activity within patient's tolerance, Repositioned, Patient requesting pain meds-RN notified    Home Living Family/patient expects to be discharged to:: Private residence Living Arrangements: Alone Available Help at Discharge: Family;Available PRN/intermittently (Can stay with his cousin at d/c, if needed.) Type  of Home: House Home Access: Level entry       Home Layout: One level Home Equipment: Crutches      Prior Function Prior Level of Function : Independent/Modified Independent;Driving                ADLs Comments: Pt does report sponge bathing for last month due to R heel wound.     Hand Dominance        Extremity/Trunk Assessment   Upper Extremity Assessment Upper Extremity Assessment: Defer to OT evaluation    Lower Extremity Assessment Lower Extremity Assessment: RLE deficits/detail RLE Deficits / Details: R heel wound with osteo. Dressing in place.    Cervical / Trunk Assessment Cervical / Trunk Assessment: Kyphotic  Communication   Communication: No difficulties  Cognition Arousal/Alertness: Awake/alert Behavior During Therapy: WFL for tasks assessed/performed Overall Cognitive Status: Within Functional Limits for tasks assessed                                          General Comments      Exercises     Assessment/Plan    PT Assessment Patient needs continued PT services  PT Problem List Decreased balance;Pain;Decreased mobility;Decreased knowledge of use of DME;Decreased activity tolerance       PT Treatment Interventions DME instruction;Functional mobility training;Balance training;Patient/family education;Gait training;Therapeutic activities;Stair training;Therapeutic exercise    PT Goals (Current goals can be found in the Care Plan section)  Acute Rehab PT Goals Patient Stated Goal: home, independence PT Goal Formulation: With patient Time For Goal Achievement: 06/27/22 Potential to Achieve Goals: Good    Frequency Min 1X/week     Co-evaluation               AM-PAC PT "6 Clicks" Mobility  Outcome Measure Help needed turning from your back to your side while in a flat bed without using bedrails?: None Help needed moving from lying on your back to sitting on the side of a flat bed without using bedrails?: A Little Help needed moving to and from a bed to a chair (including a wheelchair)?: A Little Help needed standing up from a chair using your arms (e.g., wheelchair or bedside chair)?: A Little Help needed to walk  in hospital room?: A Little Help needed climbing 3-5 steps with a railing? : A Little 6 Click Score: 19    End of Session Equipment Utilized During Treatment: Gait belt Activity Tolerance: Patient limited by pain Patient left: in bed;with call bell/phone within reach;with bed alarm set Nurse Communication: Mobility status PT Visit Diagnosis: Difficulty in walking, not elsewhere classified (R26.2);Pain Pain - Right/Left: Right Pain - part of body: Ankle and joints of foot    Time: [id number] PT Time Calculation (min) (ACUTE ONLY): 20 min   Charges:   PT Evaluation $PT Eval Moderate Complexity: 1 Mod          Gloriann Loan., PT  Office # [phone]   Lorriane Shire 06/13/2022, 9:28 AM

## 2022-06-14 ENCOUNTER — Inpatient Hospital Stay (HOSPITAL_COMMUNITY): Payer: Medicare HMO

## 2022-06-14 ENCOUNTER — Ambulatory Visit: Payer: Self-pay

## 2022-06-14 DIAGNOSIS — Z794 Long term (current) use of insulin: Secondary | ICD-10-CM | POA: Diagnosis not present

## 2022-06-14 DIAGNOSIS — M86171 Other acute osteomyelitis, right ankle and foot: Secondary | ICD-10-CM | POA: Diagnosis not present

## 2022-06-14 DIAGNOSIS — E1169 Type 2 diabetes mellitus with other specified complication: Secondary | ICD-10-CM | POA: Diagnosis not present

## 2022-06-14 DIAGNOSIS — Z01810 Encounter for preprocedural cardiovascular examination: Secondary | ICD-10-CM

## 2022-06-14 DIAGNOSIS — E1151 Type 2 diabetes mellitus with diabetic peripheral angiopathy without gangrene: Secondary | ICD-10-CM | POA: Diagnosis not present

## 2022-06-14 LAB — GLUCOSE, CAPILLARY
Glucose-Capillary: 162 mg/dL — ABNORMAL HIGH (ref 70–99)
Glucose-Capillary: 158 mg/dL — ABNORMAL HIGH (ref 70–99)
Glucose-Capillary: 165 mg/dL — ABNORMAL HIGH (ref 70–99)

## 2022-06-14 LAB — CBC
HCT: 37.1 % — ABNORMAL LOW (ref 39.0–52.0)
Hemoglobin: 11.7 g/dL — ABNORMAL LOW (ref 13.0–17.0)
MCH: 26.1 pg (ref 26.0–34.0)
MCHC: 31.5 g/dL (ref 30.0–36.0)
MCV: 82.8 fL (ref 80.0–100.0)
Platelets: 332 10*3/uL (ref 150–400)
RBC: 4.48 MIL/uL (ref 4.22–5.81)
RDW: 15.9 % — ABNORMAL HIGH (ref 11.5–15.5)
WBC: 10.6 10*3/uL — ABNORMAL HIGH (ref 4.0–10.5)
nRBC: 0 % (ref 0.0–0.2)

## 2022-06-14 LAB — BASIC METABOLIC PANEL
Anion gap: 6 (ref 5–15)
BUN: 25 mg/dL — ABNORMAL HIGH (ref 8–23)
CO2: 27 mmol/L (ref 22–32)
Calcium: 8.5 mg/dL — ABNORMAL LOW (ref 8.9–10.3)
Chloride: 99 mmol/L (ref 98–111)
Creatinine, Ser: 1.33 mg/dL — ABNORMAL HIGH (ref 0.61–1.24)
GFR, Estimated: 57 mL/min — ABNORMAL LOW (ref >60)
Glucose, Bld: 123 mg/dL — ABNORMAL HIGH (ref 70–99)
Potassium: 4.1 mmol/L (ref 3.5–5.1)
Sodium: 132 mmol/L — ABNORMAL LOW (ref 135–145)

## 2022-06-14 LAB — LIPID PANEL
Cholesterol: 88 mg/dL (ref 0–200)
HDL: 33 mg/dL — ABNORMAL LOW (ref >40)
LDL Cholesterol: 43 mg/dL (ref 0–99)
Total CHOL/HDL Ratio: 2.7 RATIO
Triglycerides: 60 mg/dL (ref <150)
VLDL: 12 mg/dL (ref 0–40)

## 2022-06-14 MED ORDER — INSULIN ASPART PROT & ASPART (70-30 MIX) 100 UNIT/ML ~~LOC~~ SUSP
9.0000 [IU] | Freq: Two times a day (BID) | SUBCUTANEOUS | Status: DC
  Administered 2022-06-14 – 2022-06-22 (×16): 9 [IU] via SUBCUTANEOUS

## 2022-06-14 NOTE — Progress Notes (Signed)
PHARMACIST LIPID MONITORING   Caleb [name] is a 72 y.o. male admitted on 06/07/2022 with PAD with ulcer s/p abdominal aortogram of lower extremity on 06/13/22.  Pharmacy has been consulted to optimize lipid-lowering therapy with the indication of secondary prevention for clinical ASCVD.  Recent Labs:  Lipid Panel (last 6 months):   Lab Results  Component Value Date   CHOL 88 06/14/2022   TRIG 60 06/14/2022   HDL 33 (L) 06/14/2022   CHOLHDL 2.7 06/14/2022   VLDL 12 06/14/2022   LDLCALC 43 06/14/2022    Hepatic function panel (last 6 months):   Lab Results  Component Value Date   AST 28 05/31/2022   ALT 34 05/31/2022   ALKPHOS 124 05/31/2022   BILITOT 0.4 05/31/2022   BILIDIR <0.1 05/28/2022   IBILI NOT CALCULATED 05/28/2022    SCr (since admission):   Serum creatinine: 1.33 mg/dL (H) 06/14/22 0119 Estimated creatinine clearance: 46.9 mL/min (A)  Current therapy and lipid therapy tolerance Current lipid-lowering therapy: atorvastatin 40mg  Previous lipid-lowering therapies (if applicable):  0000000 - 02/2013 atorvastatin 40mg  + fenofibrate 02/2013 - 02/2018 atorvastatin 40mg  02/2018 - 09/2020 rosuvastatin 20mg  10/2020 - present atorvastatin 40mg   Documented or reported allergies or intolerances to lipid-lowering therapies (if applicable): none  Assessment:   72 yo M with LDL 43, at goal LDL <50, on current home lipid therapy regimen of atorvastatin 40mg  daily. No changes to current lipid-lowering therapy are indicated  Plan:    1.Statin intensity (high intensity recommended for all patients regardless of the LDL):  No statin changes. The patient is already on a high intensity statin.  2.Add ezetimibe (if any one of the following):   Not indicated at this time.  3.Refer to lipid clinic:   No  4.Follow-up with:  Primary care provider - [name], MD  5.Follow-up labs after discharge:  No changes in lipid therapy, repeat a lipid panel in one year.        Luisa Hart, PharmD, BCPS Clinical Pharmacist 06/14/2022 7:33 AM   Please refer to [facility] for pharmacy phone number

## 2022-06-14 NOTE — Progress Notes (Signed)
Physical Therapy Treatment Patient Details Name: Caleb [name] MRN: [id number] DOB: 1950/04/29 Today's Date: 06/14/2022   History of Present Illness Pt is a 72 y.o. male admitted 3/19 with R calcaneal osteomyelitis. Pt underwent RLE angiogram 3/25. Tentative surgical plan for RLE bypass 3/29. PMH: IDDM, GERD, HTN, hypercholesterolemia, hyponatremia, chronic systolic CHF with ejection fraction 35 to 40%, h/o TAVR, PVD, neuropathy    PT Comments    Pt received in bed agreeable to participation in therapy. He underwent RLE angiogram yesterday, 3/25. He required supervision bed mobility, min guard assist transfers, and min guard assist amb 25' with RW. Mobility limited by nausea. Pt feels nausea is related to undergoing his procedure yesterday and not having eaten breakfast yet this morning. Pt in recliner with feet elevated at end of session.    Recommendations for follow up therapy are one component of a multi-disciplinary discharge planning process, led by the attending physician.  Recommendations may be updated based on patient status, additional functional criteria and insurance authorization.  Follow Up Recommendations       Assistance Recommended at Discharge Intermittent Supervision/Assistance  Patient can return home with the following A little help with bathing/dressing/bathroom;Assistance with cooking/housework;Assist for transportation;Help with stairs or ramp for entrance   Equipment Recommendations  Rolling walker (2 wheels);BSC/3in1    Recommendations for Other Services       Precautions / Restrictions Precautions Precautions: Other (comment) Precaution Comments: R heel pain Restrictions Weight Bearing Restrictions: No     Mobility  Bed Mobility Overal bed mobility: Needs Assistance Bed Mobility: Supine to Sit     Supine to sit: Supervision, HOB elevated     General bed mobility comments: +rail, increased time    Transfers Overall transfer level: Needs  assistance Equipment used: Rolling walker (2 wheels) Transfers: Sit to/from Stand Sit to Stand: Min guard, From elevated surface           General transfer comment: no physical assist to power up from elevated surface. Cues to reach back for armrest and control descent during stand to sit.    Ambulation/Gait Ambulation/Gait assistance: Min guard Gait Distance (Feet): 25 Feet Assistive device: Rolling walker (2 wheels) Gait Pattern/deviations: Step-through pattern, Decreased stride length, Antalgic, Decreased weight shift to right, Decreased stance time - right Gait velocity: decreased Gait velocity interpretation: <1.31 ft/sec, indicative of household ambulator   General Gait Details: distance limited by nausea   Stairs             Wheelchair Mobility    Modified Rankin (Stroke Patients Only)       Balance Overall balance assessment: Needs assistance Sitting-balance support: Feet supported, No upper extremity supported Sitting balance-Leahy Scale: Good     Standing balance support: Bilateral upper extremity supported, No upper extremity supported, During functional activity Standing balance-Leahy Scale: Fair Standing balance comment: RW to offload RLE during mobility                            Cognition Arousal/Alertness: Awake/alert Behavior During Therapy: WFL for tasks assessed/performed Overall Cognitive Status: Within Functional Limits for tasks assessed                                          Exercises      General Comments General comments (skin integrity, edema, etc.): SpO2 100% on RA  Pertinent Vitals/Pain Pain Assessment Pain Assessment: 0-10 Pain Score: 4  Pain Location: R foot Pain Descriptors / Indicators: Discomfort Pain Intervention(s): Limited activity within patient's tolerance, Repositioned    Home Living                          Prior Function            PT Goals (current goals  can now be found in the care plan section) Acute Rehab PT Goals Patient Stated Goal: home, independence Progress towards PT goals: Progressing toward goals    Frequency    Min 1X/week      PT Plan Current plan remains appropriate    Co-evaluation              AM-PAC PT "6 Clicks" Mobility   Outcome Measure  Help needed turning from your back to your side while in a flat bed without using bedrails?: None Help needed moving from lying on your back to sitting on the side of a flat bed without using bedrails?: A Little Help needed moving to and from a bed to a chair (including a wheelchair)?: A Little Help needed standing up from a chair using your arms (e.g., wheelchair or bedside chair)?: A Little Help needed to walk in hospital room?: A Little Help needed climbing 3-5 steps with a railing? : A Lot 6 Click Score: 18    End of Session Equipment Utilized During Treatment: Gait belt Activity Tolerance: Treatment limited secondary to medical complications (Comment) (nausea) Patient left: in chair;with call bell/phone within reach Nurse Communication: Mobility status PT Visit Diagnosis: Difficulty in walking, not elsewhere classified (R26.2);Pain Pain - Right/Left: Right Pain - part of body: Ankle and joints of foot     Time: [id number] PT Time Calculation (min) (ACUTE ONLY): 19 min  Charges:  $Gait Training: 8-22 mins                     Gloriann Loan., PT  Office # [phone]    Caleb [name] 06/14/2022, 8:15 AM

## 2022-06-14 NOTE — Progress Notes (Signed)
Lower extremity vein mapping study completed.   Please see CV Proc for preliminary results.   Jalina Blowers, RDMS, RVT  

## 2022-06-14 NOTE — Progress Notes (Addendum)
Progress Note    06/14/2022 7:12 AM 1 Day Post-Op  Subjective:  no complaints   Vitals:   06/14/22 0323 06/14/22 0629  BP: 92/62   Pulse: 69   Resp: 16 20  Temp: 98 F (36.7 C)   SpO2: 98%    Physical Exam: Cardiac:  regular Lungs:  non labored Extremities:  left cf access site without any swelling or hematoma. Dressing dry. Left heel dressed Abdomen:  soft Neurologic: alert and oriented  CBC    Component Value Date/Time   WBC 10.6 (H) 06/14/2022 0119   RBC 4.48 06/14/2022 0119   HGB 11.7 (L) 06/14/2022 0119   HGB 14.6 03/09/2022 1233   HGB 13.3 11/15/2019 1139   HCT 37.1 (L) 06/14/2022 0119   HCT 40.2 11/15/2019 1139   PLT 332 06/14/2022 0119   PLT 216 03/09/2022 1233   PLT 300 11/15/2019 1139   MCV 82.8 06/14/2022 0119   MCV 86 11/15/2019 1139   MCH 26.1 06/14/2022 0119   MCHC 31.5 06/14/2022 0119   RDW 15.9 (H) 06/14/2022 0119   RDW 14.5 11/15/2019 1139   LYMPHSABS 1.1 05/27/2022 1513   LYMPHSABS 3.3 (H) 09/28/2017 1113   MONOABS 0.5 05/27/2022 1513   EOSABS 0.0 05/27/2022 1513   EOSABS 0.3 09/28/2017 1113   BASOSABS 0.0 05/27/2022 1513   BASOSABS 0.0 09/28/2017 1113    BMET    Component Value Date/Time   NA 132 (L) 06/14/2022 0119   NA 142 02/16/2022 1544   K 4.1 06/14/2022 0119   CL 99 06/14/2022 0119   CO2 27 06/14/2022 0119   GLUCOSE 123 (H) 06/14/2022 0119   BUN 25 (H) 06/14/2022 0119   BUN 22 02/16/2022 1544   CREATININE 1.33 (H) 06/14/2022 0119   CREATININE 1.36 (H) 03/09/2022 1233   CREATININE 0.86 06/06/2016 1455   CALCIUM 8.5 (L) 06/14/2022 0119   GFRNONAA 57 (L) 06/14/2022 0119   GFRNONAA 56 (L) 03/09/2022 1233   GFRNONAA 87 04/02/2013 1632   GFRAA >60 12/17/2019 0354   GFRAA >89 04/02/2013 1632    INR    Component Value Date/Time   INR 1.0 01/11/2022 0957     Intake/Output Summary (Last 24 hours) at 06/14/2022 [id number] Last data filed at 06/13/2022 1940 Gross per 24 hour  Intake 150 ml  Output 400 ml  Net -250 ml      Assessment/Plan:  72 y.o. male is s/p CO2 Aortogram, and iliac arteriogram with RLE runoff  1 Day Post-Op   Left common femoral access site soft without hematoma  Scr stable post angio 1.33 No endovascular revascularization options for RLE Has occluded popliteal artery and will need Femoral to tibial bypass Plan will be for right fem distal bypass later this week. Dr. Trula Slade will provide definitive details   [name], PA-C [facility] [phone] 06/14/2022 7:12 AM  VASCULAR STAFF ADDENDUM: I have independently interviewed and examined the patient. I agree with the above.  Duwaine is lower extremity angiogram was reviewed demonstrating severe peripheral arterial disease, with no inline flow to the foot.  His posterior tibial artery is occluded in its entirety.  The peroneal artery reconstitutes, but is atretic.  His healthiest artery is the anterior tibial artery which reconstitutes roughly 4 cm distal to the ostia.  Unfortunately, Augusta has a right heel ulceration.  While revascularization will improve blood flow to the foot, the anterior tibial artery does not directly perfuse the heel.  I had a long discussion with [name] regarding  the above.  He is aware that even with revascularization, there is a risk of limb loss.  His peripheral arterial disease is a product of longstanding diabetes, as well as some renal dysfunction.  Other options include primary amputation, which Zacaria wants to avoid.  After discussing the risk and benefits of femoral to anterior tibial artery bypass, Ferrel elected to proceed.  He will be vein mapped today to see if he has adequate conduit for use.  Should greater saphenous vein be present, this will be utilized as it has a much higher patency rate than PTFE. I will stop by later today to discuss the above with his daughter.    Cassandria Santee, MD [facility] Phone Number: [phone] 06/14/2022  10:14 AM

## 2022-06-14 NOTE — Progress Notes (Signed)
[facility] for Infectious Disease    Date of Admission:  06/07/2022      ID: Caleb [name] is a 72 y.o. male with   Principal Problem:   Acute osteomyelitis of right calcaneus ([place]) Active Problems:   HTN (hypertension)   Hyperlipidemia   Hyponatremia   Dyslipidemia   Type 2 diabetes mellitus with hyperlipidemia (HCC)   Acute kidney injury superimposed on chronic kidney disease (HCC)   PAD (peripheral artery disease) (HCC)   S/P TAVR (transcatheter aortic valve replacement)   Decubitus ulcer of right heel   Diabetic foot ulcer (HCC)    Subjective: Afebrile, underwent arteriogram yesterday to find severe pad, and would be candidate for RLE bypass.  Medications:   amoxicillin-clavulanate  1 tablet Oral BID   aspirin  81 mg Oral Daily   atorvastatin  40 mg Oral Daily   carvedilol  12.5 mg Oral BID WC   cholecalciferol  1,000 Units Oral Daily   clopidogrel  75 mg Oral Daily   cyanocobalamin  500 mcg Oral q AM   docusate sodium  100 mg Oral BID   doxycycline  100 mg Oral Q12H   ferrous sulfate  325 mg Oral Q breakfast   furosemide  40 mg Oral q1600   furosemide  80 mg Oral q AM   heparin  5,000 Units Subcutaneous Q8H   hydrALAZINE  25 mg Oral TID   insulin aspart  0-15 Units Subcutaneous TID WC   insulin aspart  0-5 Units Subcutaneous QHS   insulin aspart protamine- aspart  9 Units Subcutaneous BID WC   isosorbide mononitrate  30 mg Oral Daily   sacubitril-valsartan  1 tablet Oral BID   sodium chloride flush  3 mL Intravenous Q12H    Objective: Vital signs in last 24 hours: Temp:  [97.8 F (36.6 C)-98.4 F (36.9 C)] 97.8 F (36.6 C) (03/26 1028) Pulse Rate:  [68-89] 86 (03/26 1028) Resp:  [14-20] 16 (03/26 1028) BP: (92-146)/(62-97) 127/85 (03/26 1028) SpO2:  [95 %-99 %] 99 % (03/26 1028) Weight:  [69.6 kg] 69.6 kg (03/26 0629)  Physical Exam  Constitutional: He is oriented to person, place, and time. He appears well-developed and well-nourished. No  distress.  HENT:  Mouth/Throat: Oropharynx is clear and moist. No oropharyngeal exudate.  Cardiovascular: Normal rate, regular rhythm and normal heart sounds. Exam reveals no gallop and no friction rub.  No murmur heard.  Pulmonary/Chest: Effort normal and breath sounds normal. No respiratory distress. He has no wheezes.  Abdominal: Soft. Bowel sounds are normal. He exhibits no distension. There is no tenderness.  Lymphadenopathy:  He has no cervical adenopathy.  Neurological: He is alert and oriented to person, place, and time.  Skin: Skin is warm and dry. No rash noted. No erythema.  Psychiatric: He has a normal mood and affect. His behavior is normal.    Lab Results Recent Labs    06/13/22 0112 06/13/22 0744 06/14/22 0119  WBC 12.6*  --  10.6*  HGB 12.6*  --  11.7*  HCT 39.0  --  37.1*  NA 130*  --  132*  K 6.0* 4.6 4.1  CL 95*  --  99  CO2 25  --  27  BUN 36*  --  25*  CREATININE 1.68*  --  1.33*    Microbiology: reviewed Studies/Results: VAS [place] LOWER EXTREMITY SAPHENOUS VEIN MAPPING  Result Date: 06/14/2022 LOWER EXTREMITY VEIN MAPPING Patient Name:  Caleb [name]  Date of Exam:  06/14/2022 Medical Rec #: [id number]        Accession #:    [id number] Date of Birth: April 30, 1950         Patient Gender: M Patient Age:   86 years Exam Location:  [facility] Procedure:      VAS [place] LOWER EXTREMITY SAPHENOUS VEIN MAPPING Referring Phys: Harold Barban --------------------------------------------------------------------------------  Indications:       Pre-op right lower extremity bypass Other Indications: PAD  Comparison Study: 06-13-2022 Abdominal aortogram w/ right lower extremity. Performing Technologist: Darlin Coco RDMS, RVT  Examination Guidelines: A complete evaluation includes B-mode imaging, spectral Doppler, color Doppler, and power Doppler as needed of all accessible portions of each vessel. Bilateral testing is considered an integral part of a complete examination.  Limited examinations for reoccurring indications may be performed as noted. +------------+------------------+---------------------+------------+-----------+ RT Diameter    RT Findings             GSV         LT Diameter LT Findings     (cm)                                               (cm)                +------------+------------------+---------------------+------------+-----------+     0.43                         Saphenofemoral        0.56                                                    Junction                               +------------+------------------+---------------------+------------+-----------+     0.38                         Proximal thigh        0.39                +------------+------------------+---------------------+------------+-----------+     0.28                            Mid thigh          0.40                +------------+------------------+---------------------+------------+-----------+     0.39                          Distal thigh         0.30                +------------+------------------+---------------------+------------+-----------+     0.31        branching             Knee             0.29     branching  +------------+------------------+---------------------+------------+-----------+     0.39  Prox calf          0.24                +------------+------------------+---------------------+------------+-----------+     0.26    multiple branches       Mid calf           0.21     branching  +------------+------------------+---------------------+------------+-----------+     0.25        branching          Distal calf         0.26                +------------+------------------+---------------------+------------+-----------+               bandaging- not          Ankle            0.28                                visualized                                                  +------------+------------------+---------------------+------------+-----------+    Preliminary    PERIPHERAL VASCULAR CATHETERIZATION  Result Date: 06/13/2022 Table formatting from the original result was not included. Images from the original result were not included. PATIENT: Caleb [name]      MRN: [id number] DOB: 12/28/1950    DATE OF PROCEDURE: 06/13/2022 INDICATIONS:  Caleb [name] is a 72 y.o. male with a nonhealing wound of the right foot and evidence of significant infrainguinal arterial occlusive disease.  He presents for arteriography. PROCEDURE:  Conscious sedation Ultrasound-guided access to the left common femoral artery CO2 aortogram and iliac arteriogram Selective catheterization of the right external iliac artery (second-order catheterization) with right lower extremity runoff Mynx closure of the left common femoral artery SURGEON: Judeth Cornfield. Scot Dock, MD, FACS ANESTHESIA: Local with sedation EBL: Minimal TOTAL CONTRAST: 15 ml TECHNIQUE: The patient was brought to the peripheral vascular lab and was sedated. The period of conscious sedation was 47 minutes.  During that time period, I was present face-to-face 100% of the time.  The patient was administered initially 1 mg of Versed and 50 mcg of fentanyl. The patient's heart rate, blood pressure, and oxygen saturation were monitored by the nurse continuously during the procedure. Both groins were prepped and draped in the usual sterile fashion.  Under ultrasound guidance, after the skin was anesthetized, I cannulated the left common femoral artery with a micropuncture needle and a micropuncture sheath was introduced over a wire.  This was exchanged for a 5 [place] sheath over a Bentson wire.  By ultrasound the femoral artery was patent. A real-time image was obtained and sent to the server. A pigtail catheter was positioned at the L1 vertebral body and CO2 aortogram obtained.  The catheter was in position above the aortic bifurcation and an  oblique iliac projection was obtained.  Of note the patient had significant bowel gas.  I then exchanged the pigtail catheter for a crossover catheter which was positioned into the right common iliac artery.  I advanced the Bentson wire into the external iliac artery and exchanged for a straight catheter.  Selective right external iliac arteriogram was obtained initially using CO2 and then  using limited contrast. At the completion of the procedure the catheter was removed.  The left groin was closed with a Mynx device with no immediate complications noted. FINDINGS: No significant disease noted in the infrarenal aorta, bilateral common iliac arteries, bilateral external iliac arteries, or bilateral internal iliac arteries. This patient had history of renal insufficiency and limited contrast was used.  Only the right leg was studied.  On the right side there was a focal stenosis in the distal common femoral artery and proximal superficial femoral artery.  The right superficial femoral artery had moderate diffuse disease throughout and the popliteal artery was occluded just above the level of the knee.  There was reconstitution of the anterior tibial artery and peroneal artery only.  The posterior tibial artery was occluded.  There is some disease in the dorsalis pedis on the right. CLINICAL NOTE: The patient will be considered for right lower extremity bypass.  I will discuss with Dr. Ferol Luz, MD, FACS [facility]     Assessment/Plan: Right calcaneal osteomyelitis = for the timebeing ,continue on oral regimen of doxycycline 100mg  po bid plus amox/calv 875mg  bid. Tolerating without side effect. Anticipated debridement early next week  Severe PAD = will need right LE bypass likely later in the week  [facility] for Infectious Diseases Pager: [phone]  06/14/2022, 1:48 PM

## 2022-06-14 NOTE — Progress Notes (Signed)
PROGRESS NOTE    Caleb [name]  [id number] DOB: 31-May-1950 DOA: 06/07/2022 PCP: [name], MD     Brief Narrative:  Caleb [name] is a 72 year old man with PVD, DM 2, HTN, AAS s/p TAVR, HFrEF with a EF of 35% send CKD 3 was admitted with recurrent diabetic foot infection and acute on chronic kidney injury.  Workup has revealed acute osteomyelitis in the posterolateral aspect of the calcaneus.  Patient is being treated with gentle IV hydration at 75 cc an hour and doxycycline/Augmentin pending surgery/bone biopsy per ID recommendations.  Patient is being seen by vascular surgery who would like to do an angiogram however we are waiting improvement in creatinine prior to proceeding with dye load needed for the angiogram.  New events last 24 hours / Subjective: About to go to vascular unit for vein mapping  Assessment & Plan:   Principal Problem:   Acute osteomyelitis of right calcaneus (HCC) Active Problems:   Type 2 diabetes mellitus with hyperlipidemia ([place])   Acute kidney injury superimposed on chronic kidney disease (Caleb [name])   HTN (hypertension)   Hyperlipidemia   Hyponatremia   Dyslipidemia   PAD (peripheral artery disease) (Caleb [name])   S/P TAVR (transcatheter aortic valve replacement)   Decubitus ulcer of right heel   Diabetic foot ulcer (Caleb [name])   Sepsis secondary to acute osteomyelitis right heel and PVD  -Sepsis POA  -Appreciate infectious disease -Continue doxycycline, Augmentin -Aspirin, Plavix, lipitor  -Status post angiogram 3/25.  Recommending femoral distal bypass  AKI on CKD stage IIIb -Baseline Cr 2-2.5 -Appreciate nephrology -Thought to be secondary to hypovolemia while on Entresto -Continue to hold Entresto and Lasix -Nephrology signed off 3/22.  Follow-up with [name] later this month -Improving  Hyperkalemia -Resolved  Diabetes mellitus type 2 with hyperglycemia -Novolog mix BID, SSI.  Dose adjusted today    DVT prophylaxis:  heparin  injection 5,000 Units Start: 06/07/22 2200 SCDs Start: 06/07/22 1348  Code Status: Full Family Communication: None at bedside  Disposition Plan:  Status is: Inpatient Remains inpatient appropriate because: Further vascular surgery planned, recommended for femoral bypass   Antimicrobials:  Anti-infectives (From admission, onward)    Start     Dose/Rate Route Frequency Ordered Stop   06/09/22 1245  doxycycline (VIBRA-TABS) tablet 100 mg        100 mg Oral Every 12 hours 06/09/22 1145     06/09/22 1245  amoxicillin-clavulanate (AUGMENTIN) 500-125 MG per tablet 1 tablet        1 tablet Oral 2 times daily 06/09/22 1145     06/08/22 1600  cefTRIAXone (ROCEPHIN) 2 g in sodium chloride 0.9 % 100 mL IVPB  Status:  Discontinued        2 g 200 mL/hr over 30 Minutes Intravenous Every 24 hours 06/08/22 0852 06/09/22 1145   06/08/22 1200  DAPTOmycin (CUBICIN) 600 mg in sodium chloride 0.9 % IVPB  Status:  Discontinued        8 mg/kg  77.4 kg 124 mL/hr over 30 Minutes Intravenous Every 48 hours 06/08/22 1012 06/09/22 1145   06/07/22 1415  linezolid (ZYVOX) IVPB 600 mg  Status:  Discontinued        600 mg 300 mL/hr over 60 Minutes Intravenous Every 12 hours 06/07/22 1414 06/08/22 0852   06/07/22 1415  ceFEPIme (MAXIPIME) 2 g in sodium chloride 0.9 % 100 mL IVPB  Status:  Discontinued        2 g 200 mL/hr over 30 Minutes Intravenous Daily  06/07/22 1414 06/08/22 0852        Objective: Vitals:   06/14/22 0323 06/14/22 0629 06/14/22 0758 06/14/22 1028  BP: 92/62  (!) 146/97 127/85  Pulse: 69  89 86  Resp: 16 20 19 16   Temp: 98 F (36.7 C)  97.9 F (36.6 C) 97.8 F (36.6 C)  TempSrc: Oral  Oral Oral  SpO2: 98%  97% 99%  Weight:  69.6 kg    Height:        Intake/Output Summary (Last 24 hours) at 06/14/2022 1343 Last data filed at 06/14/2022 1300 Gross per 24 hour  Intake 630 ml  Output 1050 ml  Net -420 ml    Filed Weights   06/11/22 0709 06/12/22 0500 06/14/22 0629  Weight:  68.6 kg 68.9 kg 69.6 kg    Examination:  General exam: Appears calm and comfortable  Respiratory system: Respiratory effort normal. No respiratory distress.  Psychiatry: Judgement and insight appear normal. Mood & affect appropriate.   Data Reviewed: I have personally reviewed following labs and imaging studies  CBC: Recent Labs  Lab 06/10/22 0123 06/11/22 0047 06/12/22 0127 06/13/22 0112 06/14/22 0119  WBC 13.1* 13.0* 13.7* 12.6* 10.6*  HGB 13.0 12.8* 12.3* 12.6* 11.7*  HCT 39.2 38.4* 38.7* 39.0 37.1*  MCV 81.0 81.4 82.9 82.1 82.8  PLT 298 305 301 296 [id number]    Basic Metabolic Panel: Recent Labs  Lab 06/10/22 1525 06/11/22 0047 06/12/22 0127 06/13/22 0112 06/13/22 0744 06/14/22 0119  NA 130* 130* 129* 130*  --  132*  K 4.3 4.2 4.4 6.0* 4.6 4.1  CL 97* 96* 98 95*  --  99  CO2 25 26 24 25   --  27  GLUCOSE 126* 123* 140* 164*  --  123*  BUN 54* 48* 41* 36*  --  25*  CREATININE 1.71* 1.63* 1.51* 1.68*  --  1.33*  CALCIUM 8.5* 8.6* 8.5* 8.8*  --  8.5*    GFR: Estimated Creatinine Clearance: 46.9 mL/min (A) (by C-G formula based on SCr of 1.33 mg/dL (H)). Liver Function Tests: No results for input(s): "AST", "ALT", "ALKPHOS", "BILITOT", "PROT", "ALBUMIN" in the last 168 hours. No results for input(s): "LIPASE", "AMYLASE" in the last 168 hours. No results for input(s): "AMMONIA" in the last 168 hours. Coagulation Profile: No results for input(s): "INR", "PROTIME" in the last 168 hours. Cardiac Enzymes: Recent Labs  Lab 06/09/22 0101  CKTOTAL 54    BNP (last 3 results) No results for input(s): "PROBNP" in the last 8760 hours. HbA1C: No results for input(s): "HGBA1C" in the last 72 hours. CBG: Recent Labs  Lab 06/13/22 0553 06/13/22 1329 06/13/22 1726 06/13/22 2107 06/14/22 0625  GLUCAP 172* 127* 129* 236* 162*    Lipid Profile: Recent Labs    06/14/22 0119  CHOL 88  HDL 33*  LDLCALC 43  TRIG 60  CHOLHDL 2.7   Thyroid Function Tests: No  results for input(s): "TSH", "T4TOTAL", "FREET4", "T3FREE", "THYROIDAB" in the last 72 hours. Anemia Panel: No results for input(s): "VITAMINB12", "FOLATE", "FERRITIN", "TIBC", "IRON", "RETICCTPCT" in the last 72 hours. Sepsis Labs: Recent Labs  Lab 06/07/22 1421 06/07/22 1844 06/08/22 0828  PROCALCITON  --   --  1.00  LATICACIDVEN 2.5* 3.0*  --      Recent Results (from the past 240 hour(s))  Culture, blood (Routine X 2) w Reflex to ID Panel     Status: None   Collection Time: 06/08/22  8:28 AM   Specimen: BLOOD  Result Value  Ref Range Status   Specimen Description   Final    BLOOD RIGHT ANTECUBITAL Performed at [facility] [address] [place], [place] [address]    Special Requests   Final    BOTTLES DRAWN AEROBIC ONLY Blood Culture adequate volume Performed at [facility] [address] [place], [place] [address]    Culture   Final    NO GROWTH 5 DAYS Performed at [facility], [place] [address] [place], [place] [address]    Report Status 06/13/2022 FINAL  Final  Culture, blood (Routine X 2) w Reflex to ID Panel     Status: None   Collection Time: 06/08/22  8:28 AM   Specimen: BLOOD RIGHT HAND  Result Value Ref Range Status   Specimen Description   Final    BLOOD RIGHT HAND Performed at [facility] [address], [place], [place] [address]    Special Requests   Final    BOTTLES DRAWN AEROBIC ONLY Blood Culture adequate volume Performed at [facility] [address], [place], [place] [address]    Culture   Final    NO GROWTH 5 DAYS Performed at [facility], [place] [address] [place],  [address]    Report Status 06/13/2022 FINAL  Final      Radiology Studies: VAS [place] LOWER EXTREMITY SAPHENOUS VEIN MAPPING  Result Date: 06/14/2022 LOWER EXTREMITY VEIN MAPPING Patient Name:  AUSTINN VANWIEREN  Date of Exam:   06/14/2022 Medical Rec #: [id number]        Accession #:     [id number] Date of Birth: [birth year]         Patient Gender: M Patient Age:   49 years Exam Location:  [facility] Procedure:      VAS [place] LOWER EXTREMITY SAPHENOUS VEIN MAPPING Referring Phys: Harold Barban --------------------------------------------------------------------------------  Indications:       Pre-op right lower extremity bypass Other Indications: PAD  Comparison Study: 06-13-2022 Abdominal aortogram w/ right lower extremity. Performing Technologist: Darlin Coco RDMS, RVT  Examination Guidelines: A complete evaluation includes B-mode imaging, spectral Doppler, color Doppler, and power Doppler as needed of all accessible portions of each vessel. Bilateral testing is considered an integral part of a complete examination. Limited examinations for reoccurring indications may be performed as noted. +------------+------------------+---------------------+------------+-----------+ RT Diameter    RT Findings             GSV         LT Diameter LT Findings     (cm)                                               (cm)                +------------+------------------+---------------------+------------+-----------+     0.43                         Saphenofemoral        0.56                                                    Junction                               +------------+------------------+---------------------+------------+-----------+  0.38                         Proximal thigh        0.39                +------------+------------------+---------------------+------------+-----------+     0.28                            Mid thigh          0.40                +------------+------------------+---------------------+------------+-----------+     0.39                          Distal thigh         0.30                +------------+------------------+---------------------+------------+-----------+     0.31        branching             Knee             0.29      branching  +------------+------------------+---------------------+------------+-----------+     0.39                            Prox calf          0.24                +------------+------------------+---------------------+------------+-----------+     0.26    multiple branches       Mid calf           0.21     branching  +------------+------------------+---------------------+------------+-----------+     0.25        branching          Distal calf         0.26                +------------+------------------+---------------------+------------+-----------+               bandaging- not          Ankle            0.28                                visualized                                                 +------------+------------------+---------------------+------------+-----------+    Preliminary    PERIPHERAL VASCULAR CATHETERIZATION  Result Date: 06/13/2022 Table formatting from the original result was not included. Images from the original result were not included. PATIENT: ROHITH PICTON      MRN: [id number] DOB: November 19, 1950    DATE OF PROCEDURE: 06/13/2022 INDICATIONS:  KIWANE FROHMAN is a 72 y.o. male with a nonhealing wound of the right foot and evidence of significant infrainguinal arterial occlusive disease.  He presents for arteriography. PROCEDURE:  Conscious sedation Ultrasound-guided access to the left common femoral artery CO2 aortogram and iliac arteriogram Selective catheterization of the right external iliac artery (second-order catheterization) with right lower extremity runoff Mynx closure of the left common femoral  artery SURGEON: Judeth Cornfield. Scot Dock, MD, FACS ANESTHESIA: Local with sedation EBL: Minimal TOTAL CONTRAST: 15 ml TECHNIQUE: The patient was brought to the peripheral vascular lab and was sedated. The period of conscious sedation was 47 minutes.  During that time period, I was present face-to-face 100% of the time.  The patient was administered initially  1 mg of Versed and 50 mcg of fentanyl. The patient's heart rate, blood pressure, and oxygen saturation were monitored by the nurse continuously during the procedure. Both groins were prepped and draped in the usual sterile fashion.  Under ultrasound guidance, after the skin was anesthetized, I cannulated the left common femoral artery with a micropuncture needle and a micropuncture sheath was introduced over a wire.  This was exchanged for a 5 [place] sheath over a Bentson wire.  By ultrasound the femoral artery was patent. A real-time image was obtained and sent to the server. A pigtail catheter was positioned at the L1 vertebral body and CO2 aortogram obtained.  The catheter was in position above the aortic bifurcation and an oblique iliac projection was obtained.  Of note the patient had significant bowel gas.  I then exchanged the pigtail catheter for a crossover catheter which was positioned into the right common iliac artery.  I advanced the Bentson wire into the external iliac artery and exchanged for a straight catheter.  Selective right external iliac arteriogram was obtained initially using CO2 and then using limited contrast. At the completion of the procedure the catheter was removed.  The left groin was closed with a Mynx device with no immediate complications noted. FINDINGS: No significant disease noted in the infrarenal aorta, bilateral common iliac arteries, bilateral external iliac arteries, or bilateral internal iliac arteries. This patient had history of renal insufficiency and limited contrast was used.  Only the right leg was studied.  On the right side there was a focal stenosis in the distal common femoral artery and proximal superficial femoral artery.  The right superficial femoral artery had moderate diffuse disease throughout and the popliteal artery was occluded just above the level of the knee.  There was reconstitution of the anterior tibial artery and peroneal artery only.  The  posterior tibial artery was occluded.  There is some disease in the dorsalis pedis on the right. CLINICAL NOTE: The patient will be considered for right lower extremity bypass.  I will discuss with Dr. Trula Slade. [name], MD, FACS [facility]      Scheduled Meds:  amoxicillin-clavulanate  1 tablet Oral BID   aspirin  81 mg Oral Daily   atorvastatin  40 mg Oral Daily   carvedilol  12.5 mg Oral BID WC   cholecalciferol  1,000 Units Oral Daily   clopidogrel  75 mg Oral Daily   cyanocobalamin  500 mcg Oral q AM   docusate sodium  100 mg Oral BID   doxycycline  100 mg Oral Q12H   ferrous sulfate  325 mg Oral Q breakfast   furosemide  40 mg Oral q1600   furosemide  80 mg Oral q AM   heparin  5,000 Units Subcutaneous Q8H   hydrALAZINE  25 mg Oral TID   insulin aspart  0-15 Units Subcutaneous TID WC   insulin aspart  0-5 Units Subcutaneous QHS   insulin aspart protamine- aspart  9 Units Subcutaneous BID WC   isosorbide mononitrate  30 mg Oral Daily   sacubitril-valsartan  1 tablet Oral BID   sodium chloride flush  3 mL Intravenous Q12H   Continuous Infusions:  sodium chloride Stopped (06/13/22 1553)   sodium chloride       LOS: 7 days   Time spent: 20 minutes   Dessa Phi, DO [facility] 06/14/2022, 1:43 PM   Available via Epic secure chat 7am-7pm After these hours, please refer to coverage provider listed on [email or web address]

## 2022-06-14 NOTE — Patient Outreach (Signed)
  Care Coordination   Follow Up Visit Note   06/14/2022 Name: Caleb [name] MRN: [id number] DOB: Dec 09, 1950  Caleb [name] is a 72 y.o. year old male who sees [name], MD for primary care. I reviewed patient's chart in preparation to contact patient.   What matters to the patients health and wellness today?  Patient is currently inpatient at [facility].     Goals Addressed             This Visit's Progress    No complications from Decubitus ulcer of right heel       Care Coordination Interventions: Reviewed chart in preparation to contact patient for RN CC follow up  Noted patient is currently inpatient at [facility], admit date: 06/07/22; dx: Acute osteomyelitis of right calcaneus         SDOH assessments and interventions completed:  No     Care Coordination Interventions:  Yes, provided   Follow up plan: Follow up call scheduled for 06/21/22 @09 :00 AM    Encounter Outcome:  Pt. Visit Completed

## 2022-06-14 NOTE — Plan of Care (Signed)
  Problem: Activity: Goal: Risk for activity intolerance will decrease Outcome: Progressing   Problem: Nutrition: Goal: Adequate nutrition will be maintained Outcome: Progressing   Problem: Clinical Measurements: Goal: Respiratory complications will improve Outcome: Progressing   Problem: Education: Goal: Knowledge of General Education information will improve Description: Including pain rating scale, medication(s)/side effects and non-pharmacologic comfort measures Outcome: Progressing

## 2022-06-15 ENCOUNTER — Inpatient Hospital Stay (HOSPITAL_COMMUNITY): Payer: Medicare HMO

## 2022-06-15 DIAGNOSIS — M86171 Other acute osteomyelitis, right ankle and foot: Secondary | ICD-10-CM | POA: Diagnosis not present

## 2022-06-15 DIAGNOSIS — I70234 Atherosclerosis of native arteries of right leg with ulceration of heel and midfoot: Secondary | ICD-10-CM | POA: Diagnosis not present

## 2022-06-15 DIAGNOSIS — K529 Noninfective gastroenteritis and colitis, unspecified: Secondary | ICD-10-CM

## 2022-06-15 LAB — GLUCOSE, CAPILLARY
Glucose-Capillary: 97 mg/dL (ref 70–99)
Glucose-Capillary: 182 mg/dL — ABNORMAL HIGH (ref 70–99)
Glucose-Capillary: 110 mg/dL — ABNORMAL HIGH (ref 70–99)
Glucose-Capillary: 153 mg/dL — ABNORMAL HIGH (ref 70–99)

## 2022-06-15 LAB — BASIC METABOLIC PANEL
Anion gap: 9 (ref 5–15)
BUN: 21 mg/dL (ref 8–23)
CO2: 25 mmol/L (ref 22–32)
Calcium: 8.5 mg/dL — ABNORMAL LOW (ref 8.9–10.3)
Chloride: 99 mmol/L (ref 98–111)
Creatinine, Ser: 1.19 mg/dL (ref 0.61–1.24)
GFR, Estimated: >60 mL/min (ref >60)
Glucose, Bld: 110 mg/dL — ABNORMAL HIGH (ref 70–99)
Potassium: 4.4 mmol/L (ref 3.5–5.1)
Sodium: 133 mmol/L — ABNORMAL LOW (ref 135–145)

## 2022-06-15 NOTE — Assessment & Plan Note (Addendum)
Requested ID evaluation for potential C. difficile.  Check x-ray abdomen.

## 2022-06-15 NOTE — Assessment & Plan Note (Signed)
Awaiting operative angiogram and repair/debridement on Friday.  Antibiotic Augmentin and doxycycline ordered.

## 2022-06-15 NOTE — Progress Notes (Addendum)
Progress Note    06/15/2022 7:31 AM 2 Days Post-Op  Subjective:  no complaints   Vitals:   06/14/22 2335 06/15/22 0341  BP: 107/82 108/79  Pulse: 68 75  Resp: 18 17  Temp: 97.7 F (36.5 C) 97.7 F (36.5 C)  SpO2: 93% 99%   Physical Exam: Cardiac:  regular Lungs:  non labored Extremities:  Left femoral access site c/d/I without swelling or hematoma. 2+ femoral pulses, no distal pulses palpable on RLE Abdomen:  soft Neurologic: alert and oriented  CBC    Component Value Date/Time   WBC 10.6 (H) 06/14/2022 0119   RBC 4.48 06/14/2022 0119   HGB 11.7 (L) 06/14/2022 0119   HGB 14.6 03/09/2022 1233   HGB 13.3 11/15/2019 1139   HCT 37.1 (L) 06/14/2022 0119   HCT 40.2 11/15/2019 1139   PLT 332 06/14/2022 0119   PLT 216 03/09/2022 1233   PLT 300 11/15/2019 1139   MCV 82.8 06/14/2022 0119   MCV 86 11/15/2019 1139   MCH 26.1 06/14/2022 0119   MCHC 31.5 06/14/2022 0119   RDW 15.9 (H) 06/14/2022 0119   RDW 14.5 11/15/2019 1139   LYMPHSABS 1.1 05/27/2022 1513   LYMPHSABS 3.3 (H) 09/28/2017 1113   MONOABS 0.5 05/27/2022 1513   EOSABS 0.0 05/27/2022 1513   EOSABS 0.3 09/28/2017 1113   BASOSABS 0.0 05/27/2022 1513   BASOSABS 0.0 09/28/2017 1113    BMET    Component Value Date/Time   NA 133 (L) 06/15/2022 0119   NA 142 02/16/2022 1544   K 4.4 06/15/2022 0119   CL 99 06/15/2022 0119   CO2 25 06/15/2022 0119   GLUCOSE 110 (H) 06/15/2022 0119   BUN 21 06/15/2022 0119   BUN 22 02/16/2022 1544   CREATININE 1.19 06/15/2022 0119   CREATININE 1.36 (H) 03/09/2022 1233   CREATININE 0.86 06/06/2016 1455   CALCIUM 8.5 (L) 06/15/2022 0119   GFRNONAA >60 06/15/2022 0119   GFRNONAA 56 (L) 03/09/2022 1233   GFRNONAA 87 04/02/2013 1632   GFRAA >60 12/17/2019 0354   GFRAA >89 04/02/2013 1632    INR    Component Value Date/Time   INR 1.0 01/11/2022 0957     Intake/Output Summary (Last 24 hours) at 06/15/2022 0731 Last data filed at 06/14/2022 2350 Gross per 24 hour   Intake 680 ml  Output 1350 ml  Net -670 ml     Assessment/Plan:  72 y.o. male is s/p CO2 Aortogram, and iliac arteriogram with RLE runoff  2 Days Post-Op   No great revascularization options for RLE with single vessel AT runoff and right heel wound Dr. Virl [name] discussed in depth with patient yesterday continued risk for limb loss despite surgical revascularization Vein mapping showing borderline vein conduit. Will reassess at time of surgery in OR. Hopefully we will be able to use vein Plan for RLE fem tibial bypass on Friday 3/29   Caleb Caldwell, PA-C [facility] [phone] 06/15/2022 7:31 AM  Vein mapping reviewed.  Patient appears to have an adequate vein for revascularization via femoral tibial bypass.  This has been scheduled for Friday.  He will need to be n.p.o. after midnight on Thursday.  Caleb [name]  Had a long conversation with Caleb [name], and called 2 of his 3 daughters regarding his lower extremity bypass scheduled for Friday.  Unfortunately, Caleb [name] is best tibial artery is the anterior tibial artery.  This angios on is associated with the dorsal aspect of the foot.  With a heel wound, there  is a chance that even revascularization will not heal the wound.  Unfortunately he does not have a posterior tibial artery.  After discussing the risk and benefits of femoral to anterior tibial artery using greater saphenous vein, Caleb [name] elected to proceed.  Plan for Friday.

## 2022-06-15 NOTE — Progress Notes (Signed)
Occupational Therapy Treatment Patient Details Name: Caleb [name] MRN: [id number] DOB: 03-May-1950 Today's Date: 06/15/2022   History of present illness Pt is a 72 y.o. male admitted 3/19 with R calcaneal osteomyelitis. Pt underwent RLE angiogram 3/25. Tentative surgical plan for RLE bypass 3/29. PMH: IDDM, GERD, HTN, hypercholesterolemia, hyponatremia, chronic systolic CHF with ejection fraction 35 to 40%, h/o TAVR, PVD, neuropathy   OT comments  Patient continues to make steady progress towards goals in skilled OT session. Patient's session encompassed on increasing overall activity tolerance and promoting independence in ADL management.Patient noted to have decreased grip strength and neuropathy in bilateral hands, where OT provided built up red foam for toothbrush and utensils at end of session which greatly assisted patient. Patient min guard for ADLs at this current juncture, however OT will continue to follow as patient has surgeries pending to promote compensatory techniques post-op.     Recommendations for follow up therapy are one component of a multi-disciplinary discharge planning process, led by the attending physician.  Recommendations may be updated based on patient status, additional functional criteria and insurance authorization.    Assistance Recommended at Discharge PRN  Patient can return home with the following  A little help with walking and/or transfers;A little help with bathing/dressing/bathroom;Assistance with cooking/housework;Assist for transportation;Help with stairs or ramp for entrance   Equipment Recommendations  BSC/3in1    Recommendations for Other Services      Precautions / Restrictions Precautions Precautions: Other (comment) Precaution Comments: R heel pain Restrictions Weight Bearing Restrictions: No       Mobility Bed Mobility Overal bed mobility: Needs Assistance Bed Mobility: Sit to Supine, Supine to Sit     Supine to sit: Supervision,  HOB elevated Sit to supine: Supervision, HOB elevated        Transfers Overall transfer level: Needs assistance Equipment used: Rolling walker (2 wheels) Transfers: Sit to/from Stand Sit to Stand: Min guard           General transfer comment: min gaurd for safety     Balance Overall balance assessment: Needs assistance Sitting-balance support: Feet supported, No upper extremity supported Sitting balance-Leahy Scale: Good     Standing balance support: Bilateral upper extremity supported, No upper extremity supported, During functional activity Standing balance-Leahy Scale: Fair Standing balance comment: RW to offload RLE during mobility                           ADL either performed or assessed with clinical judgement   ADL Overall ADL's : Needs assistance/impaired Eating/Feeding: Set up Eating/Feeding Details (indicate cue type and reason): set up with red handled foam as well as patient needing help opening all packages and containers Grooming: Wash/dry hands;Wash/dry face;Min Systems developer: Min guard;Ambulation;Rolling walker (2 wheels) Toilet Transfer Details (indicate cue type and reason): simulated with sit<>stand from EOB         Functional mobility during ADLs: Min guard;Rolling walker (2 wheels) General ADL Comments: Session focus on increasing overall activity tolerance and promoting independence in ADL management.    Extremity/Trunk Assessment Upper Extremity Assessment Upper Extremity Assessment: LUE deficits/detail;RUE deficits/detail RUE Deficits / Details: decreased grip strength and neuropathy in bilateral hands, provided built up red foam for toothbrush and utensils at end of session which greatly assisred patient RUE Sensation: decreased light touch RUE Coordination: decreased fine motor;decreased gross motor LUE  Deficits / Details: decreased grip strength and neuropathy in bilateral hands,  provided built up red foam for toothbrush and utensils at end of session which greatly assisred patient LUE Sensation: decreased light touch LUE Coordination: decreased gross motor;decreased fine motor            Vision       Perception     Praxis      Cognition Arousal/Alertness: Awake/alert Behavior During Therapy: WFL for tasks assessed/performed Overall Cognitive Status: Within Functional Limits for tasks assessed                                          Exercises      Shoulder Instructions       General Comments      Pertinent Vitals/ Pain       Pain Assessment Pain Assessment: Faces Faces Pain Scale: Hurts even more Pain Location: R foot Pain Descriptors / Indicators: Discomfort Pain Intervention(s): Limited activity within patient's tolerance, Monitored during session, Repositioned  Home Living                                          Prior Functioning/Environment              Frequency  Min 2X/week        Progress Toward Goals  OT Goals(current goals can now be found in the care plan section)  Progress towards OT goals: Progressing toward goals  Acute Rehab OT Goals Patient Stated Goal: to get all of this under control OT Goal Formulation: With patient Time For Goal Achievement: 06/27/22 Potential to Achieve Goals: Good ADL Goals Pt Will Perform Lower Body Bathing: with modified independence;sitting/lateral leans;sit to/from stand Pt Will Perform Lower Body Dressing: with modified independence;sit to/from stand;sitting/lateral leans Pt Will Transfer to Toilet: with modified independence;ambulating Pt Will Perform Toileting - Clothing Manipulation and hygiene: with modified independence;sitting/lateral leans;sit to/from stand Additional ADL Goal #1: Patient will be able to demonstrate uses of built up handles to be able to feed self and complete ADLs without increased assist. Additional ADL Goal #2:  Patient will be able to complete functional task in standing for 3-5 minutes prior to needing seated rest break.  Plan Discharge plan needs to be updated    Co-evaluation                 AM-PAC OT "6 Clicks" Daily Activity     Outcome Measure   Help from another person eating meals?: A Little Help from another person taking care of personal grooming?: A Little Help from another person toileting, which includes using toliet, bedpan, or urinal?: A Little Help from another person bathing (including washing, rinsing, drying)?: A Little Help from another person to put on and taking off regular upper body clothing?: A Little Help from another person to put on and taking off regular lower body clothing?: A Little 6 Click Score: 18    End of Session Equipment Utilized During Treatment: Other (comment) (Red built up foam for utensils)  OT Visit Diagnosis: Unsteadiness on feet (R26.81);Pain Pain - Right/Left: Right Pain - part of body: Ankle and joints of foot   Activity Tolerance Patient tolerated treatment well   Patient Left in bed;with call bell/phone within reach;with bed alarm set  Nurse Communication Mobility status        Time: 1020-1043 OT Time Calculation (min): 23 min  Charges: OT General Charges $OT Visit: 1 Visit OT Treatments $Self Care/Home Management : 23-37 mins  [place]. Gionni Freese, OTR/L Acute Rehabilitation Services [phone]   Ascencion Dike 06/15/2022, 2:22 PM

## 2022-06-15 NOTE — Plan of Care (Signed)
  Problem: Education: Goal: Knowledge of General Education information will improve Description: Including pain rating scale, medication(s)/side effects and non-pharmacologic comfort measures Outcome: Progressing   Problem: Clinical Measurements: Goal: Respiratory complications will improve Outcome: Progressing   Problem: Activity: Goal: Risk for activity intolerance will decrease Outcome: Progressing   Problem: Coping: Goal: Level of anxiety will decrease Outcome: Progressing   

## 2022-06-15 NOTE — Assessment & Plan Note (Signed)
Augmentin and doxycycline ordered, monitor clinical response.

## 2022-06-15 NOTE — Assessment & Plan Note (Signed)
Very mild, monitoring clinically and serially

## 2022-06-15 NOTE — Progress Notes (Signed)
Progress Note   Patient: Caleb [name] [id number] DOB: Dec 25, 1950 DOA: 06/07/2022     8 DOS: the patient was seen and examined on 06/15/2022   Brief hospital course: No notes on file  Assessment and Plan: * Acute osteomyelitis of right calcaneus (HCC) Augmentin and doxycycline ordered, monitor clinical response.  Gastroenteritis Requested ID evaluation for potential C. difficile.  Check x-ray abdomen.  Decubitus ulcer of right heel Awaiting operative angiogram and repair/debridement on Friday.  Antibiotic Augmentin and doxycycline ordered.  Hyponatremia Very mild, monitoring clinically and serially  AKI resolved, overall patient doing clinically much better, hold off on telemetry and continuous pulse oximetry at this time      Subjective: Patient has had 3 watery bowel movements since last evening.  Also reports having nonbloody emesis today.  Patient has been able to tolerate diet since then.  No fever no rigors.  No abdominal pain at this time  Physical Exam: Vitals:   06/15/22 0341 06/15/22 0706 06/15/22 0748 06/15/22 1150  BP: 108/79  (!) 132/90 (!) 136/94  Pulse: 75  81 97  Resp: 17  18 (!) 21  Temp: 97.7 F (36.5 C)  98 F (36.7 C) 98.1 F (36.7 C)  TempSrc: Oral  Oral Oral  SpO2: 99%  100% 99%  Weight:  84 kg    Height:       No distress Respiratory exam: Bilateral intravesicular Abdomen soft nontender Extremities right lower extremity is dressed from the mid shin down.  Left lower extremity function intact no edema.  Distal function intact in right lower extremity. Data Reviewed:  Labs on Admission:  Results for orders placed or performed during the hospital encounter of 06/07/22 (from the past 24 hour(s))  Glucose, capillary     Status: Abnormal   Collection Time: 06/14/22  4:14 PM  Result Value Ref Range   Glucose-Capillary 158 (H) 70 - 99 mg/dL  Glucose, capillary     Status: Abnormal   Collection Time: 06/14/22  9:12 PM  Result Value Ref Range    Glucose-Capillary 165 (H) 70 - 99 mg/dL   Comment 1 Notify RN    Comment 2 Document in Chart   Basic metabolic panel     Status: Abnormal   Collection Time: 06/15/22  1:19 AM  Result Value Ref Range   Sodium 133 (L) 135 - 145 mmol/L   Potassium 4.4 3.5 - 5.1 mmol/L   Chloride 99 98 - 111 mmol/L   CO2 25 22 - 32 mmol/L   Glucose, Bld 110 (H) 70 - 99 mg/dL   BUN 21 8 - 23 mg/dL   Creatinine, Ser 1.19 0.61 - 1.24 mg/dL   Calcium 8.5 (L) 8.9 - 10.3 mg/dL   GFR, Estimated >60 >60 mL/min   Anion gap 9 5 - 15  Glucose, capillary     Status: None   Collection Time: 06/15/22  6:00 AM  Result Value Ref Range   Glucose-Capillary 97 70 - 99 mg/dL   Comment 1 Notify RN    Comment 2 Document in Chart   Glucose, capillary     Status: Abnormal   Collection Time: 06/15/22 11:52 AM  Result Value Ref Range   Glucose-Capillary 182 (H) 70 - 99 mg/dL   Basic Metabolic Panel: Recent Labs  Lab 06/11/22 0047 06/12/22 0127 06/13/22 0112 06/13/22 0744 06/14/22 0119 06/15/22 0119  NA 130* 129* 130*  --  132* 133*  K 4.2 4.4 6.0* 4.6 4.1 4.4  CL 96* 98 95*  --  99 99  CO2 26 24 25   --  27 25  GLUCOSE 123* 140* 164*  --  123* 110*  BUN 48* 41* 36*  --  25* 21  CREATININE 1.63* 1.51* 1.68*  --  1.33* 1.19  CALCIUM 8.6* 8.5* 8.8*  --  8.5* 8.5*   Liver Function Tests: No results for input(s): "AST", "ALT", "ALKPHOS", "BILITOT", "PROT", "ALBUMIN" in the last 168 hours. No results for input(s): "LIPASE", "AMYLASE" in the last 168 hours. No results for input(s): "AMMONIA" in the last 168 hours. CBC: Recent Labs  Lab 06/10/22 0123 06/11/22 0047 06/12/22 0127 06/13/22 0112 06/14/22 0119  WBC 13.1* 13.0* 13.7* 12.6* 10.6*  HGB 13.0 12.8* 12.3* 12.6* 11.7*  HCT 39.2 38.4* 38.7* 39.0 37.1*  MCV 81.0 81.4 82.9 82.1 82.8  PLT 298 305 301 296 332   Cardiac Enzymes: Recent Labs  Lab 06/09/22 0101  CKTOTAL 54    BNP (last 3 results) No results for input(s): "PROBNP" in the last 8760  hours. CBG: Recent Labs  Lab 06/14/22 0625 06/14/22 1614 06/14/22 2112 06/15/22 0600 06/15/22 1152  GLUCAP 162* 158* 165* 97 182*    Radiological Exams on Admission:  VAS [place] LOWER EXTREMITY SAPHENOUS VEIN MAPPING  Result Date: 06/14/2022 [place] MAPPING Patient Name:  PRATHEEK RIDEOUT  Date of Exam:   06/14/2022 Medical Rec #: [id number]        Accession #:    [id number] Date of Birth: 1951/01/31         Patient Gender: M Patient Age:   72 years Exam Location:  [facility] Procedure:      VAS [place] LOWER Page Referring Phys: Harold Barban --------------------------------------------------------------------------------  Indications:       Pre-op right lower extremity bypass Other Indications: PAD  Comparison Study: 06-13-2022 Abdominal aortogram w/ right lower extremity. Performing Technologist: Darlin Coco RDMS, RVT  Examination Guidelines: A complete evaluation includes B-mode imaging, spectral Doppler, color Doppler, and power Doppler as needed of all accessible portions of each vessel. Bilateral testing is considered an integral part of a complete examination. Limited examinations for reoccurring indications may be performed as noted. +------------+------------------+---------------------+------------+-----------+ RT Diameter    RT Findings             GSV         LT Diameter LT Findings     (cm)                                               (cm)                +------------+------------------+---------------------+------------+-----------+     0.43                         Saphenofemoral        0.56                                                    Junction                               +------------+------------------+---------------------+------------+-----------+     0.38  Proximal thigh        0.39                +------------+------------------+---------------------+------------+-----------+      0.28                            Mid thigh          0.40                +------------+------------------+---------------------+------------+-----------+     0.39                          Distal thigh         0.30                +------------+------------------+---------------------+------------+-----------+     0.31        branching             Knee             0.29     branching  +------------+------------------+---------------------+------------+-----------+     0.39                            Prox calf          0.24                +------------+------------------+---------------------+------------+-----------+     0.26    multiple branches       Mid calf           0.21     branching  +------------+------------------+---------------------+------------+-----------+     0.25        branching          Distal calf         0.26                +------------+------------------+---------------------+------------+-----------+               bandaging- not          Ankle            0.28                                visualized                                                 +------------+------------------+---------------------+------------+-----------+ Diagnosing physician: Harold Barban MD Electronically signed by Harold Barban MD on 06/14/2022 at 7:19:35 PM.    Final         Disposition: Status is: Inpatient Remains inpatient appropriate because: needs surgery  Planned Discharge Destination: Home    Time spent: 30 minutes minutes  Author: Gertie Fey, MD 06/15/2022 4:08 PM  For on call review [email or web address].

## 2022-06-16 DIAGNOSIS — E1169 Type 2 diabetes mellitus with other specified complication: Secondary | ICD-10-CM | POA: Diagnosis not present

## 2022-06-16 DIAGNOSIS — I70234 Atherosclerosis of native arteries of right leg with ulceration of heel and midfoot: Secondary | ICD-10-CM | POA: Diagnosis not present

## 2022-06-16 DIAGNOSIS — Z794 Long term (current) use of insulin: Secondary | ICD-10-CM | POA: Diagnosis not present

## 2022-06-16 DIAGNOSIS — R197 Diarrhea, unspecified: Secondary | ICD-10-CM

## 2022-06-16 DIAGNOSIS — R110 Nausea: Secondary | ICD-10-CM

## 2022-06-16 DIAGNOSIS — M86171 Other acute osteomyelitis, right ankle and foot: Secondary | ICD-10-CM | POA: Diagnosis not present

## 2022-06-16 DIAGNOSIS — E1151 Type 2 diabetes mellitus with diabetic peripheral angiopathy without gangrene: Secondary | ICD-10-CM | POA: Diagnosis not present

## 2022-06-16 LAB — BASIC METABOLIC PANEL
Anion gap: 9 (ref 5–15)
BUN: 20 mg/dL (ref 8–23)
CO2: 27 mmol/L (ref 22–32)
Calcium: 8.5 mg/dL — ABNORMAL LOW (ref 8.9–10.3)
Chloride: 96 mmol/L — ABNORMAL LOW (ref 98–111)
Creatinine, Ser: 1.35 mg/dL — ABNORMAL HIGH (ref 0.61–1.24)
GFR, Estimated: 56 mL/min — ABNORMAL LOW (ref >60)
Glucose, Bld: 124 mg/dL — ABNORMAL HIGH (ref 70–99)
Potassium: 4 mmol/L (ref 3.5–5.1)
Sodium: 132 mmol/L — ABNORMAL LOW (ref 135–145)

## 2022-06-16 LAB — GLUCOSE, CAPILLARY
Glucose-Capillary: 133 mg/dL — ABNORMAL HIGH (ref 70–99)
Glucose-Capillary: 143 mg/dL — ABNORMAL HIGH (ref 70–99)
Glucose-Capillary: 164 mg/dL — ABNORMAL HIGH (ref 70–99)
Glucose-Capillary: 142 mg/dL — ABNORMAL HIGH (ref 70–99)

## 2022-06-16 LAB — CBC WITH DIFFERENTIAL/PLATELET
Abs Immature Granulocytes: 0.06 10*3/uL (ref 0.00–0.07)
Basophils Absolute: 0 10*3/uL (ref 0.0–0.1)
Basophils Relative: 0 %
Eosinophils Absolute: 0.6 10*3/uL — ABNORMAL HIGH (ref 0.0–0.5)
Eosinophils Relative: 5 %
HCT: 36.4 % — ABNORMAL LOW (ref 39.0–52.0)
Hemoglobin: 11.6 g/dL — ABNORMAL LOW (ref 13.0–17.0)
Immature Granulocytes: 1 %
Lymphocytes Relative: 15 %
Lymphs Abs: 1.7 10*3/uL (ref 0.7–4.0)
MCH: 26.3 pg (ref 26.0–34.0)
MCHC: 31.9 g/dL (ref 30.0–36.0)
MCV: 82.5 fL (ref 80.0–100.0)
Monocytes Absolute: 1.3 10*3/uL — ABNORMAL HIGH (ref 0.1–1.0)
Monocytes Relative: 11 %
Neutro Abs: 8.1 10*3/uL — ABNORMAL HIGH (ref 1.7–7.7)
Neutrophils Relative %: 68 %
Platelets: 348 10*3/uL (ref 150–400)
RBC: 4.41 MIL/uL (ref 4.22–5.81)
RDW: 15.9 % — ABNORMAL HIGH (ref 11.5–15.5)
WBC: 11.8 10*3/uL — ABNORMAL HIGH (ref 4.0–10.5)
nRBC: 0 % (ref 0.0–0.2)

## 2022-06-16 MED ORDER — CHLORHEXIDINE GLUCONATE CLOTH 2 % EX PADS
6.0000 | MEDICATED_PAD | Freq: Every day | CUTANEOUS | Status: DC
  Administered 2022-06-16 – 2022-06-30 (×11): 6 via TOPICAL

## 2022-06-16 MED ORDER — DOXYCYCLINE HYCLATE 100 MG PO TABS
100.0000 mg | ORAL_TABLET | Freq: Two times a day (BID) | ORAL | Status: DC
  Administered 2022-06-16 – 2022-06-26 (×20): 100 mg via ORAL
  Filled 2022-06-16 (×20): qty 1

## 2022-06-16 NOTE — Progress Notes (Addendum)
  Progress Note    06/16/2022 7:48 AM 3 Days Post-Op  Subjective:  no complaints. Hopeful for a successful surgery tomorrow   Vitals:   06/16/22 0429 06/16/22 0500  BP:  130/87  Pulse: 80   Resp:    Temp:    SpO2: 100%    Physical Exam: Cardiac:  regular Lungs:  non labored Extremities:  No palpable distal pulses in right lower extremity . Right heel dressed. Prevalon boot on right foot Neurologic: alert and oriented  CBC    Component Value Date/Time   WBC 10.6 (H) 06/14/2022 0119   RBC 4.48 06/14/2022 0119   HGB 11.7 (L) 06/14/2022 0119   HGB 14.6 03/09/2022 1233   HGB 13.3 11/15/2019 1139   HCT 37.1 (L) 06/14/2022 0119   HCT 40.2 11/15/2019 1139   PLT 332 06/14/2022 0119   PLT 216 03/09/2022 1233   PLT 300 11/15/2019 1139   MCV 82.8 06/14/2022 0119   MCV 86 11/15/2019 1139   MCH 26.1 06/14/2022 0119   MCHC 31.5 06/14/2022 0119   RDW 15.9 (H) 06/14/2022 0119   RDW 14.5 11/15/2019 1139   LYMPHSABS 1.1 05/27/2022 1513   LYMPHSABS 3.3 (H) 09/28/2017 1113   MONOABS 0.5 05/27/2022 1513   EOSABS 0.0 05/27/2022 1513   EOSABS 0.3 09/28/2017 1113   BASOSABS 0.0 05/27/2022 1513   BASOSABS 0.0 09/28/2017 1113    BMET    Component Value Date/Time   NA 132 (L) 06/16/2022 0057   NA 142 02/16/2022 1544   K 4.0 06/16/2022 0057   CL 96 (L) 06/16/2022 0057   CO2 27 06/16/2022 0057   GLUCOSE 124 (H) 06/16/2022 0057   BUN 20 06/16/2022 0057   BUN 22 02/16/2022 1544   CREATININE 1.35 (H) 06/16/2022 0057   CREATININE 1.36 (H) 03/09/2022 1233   CREATININE 0.86 06/06/2016 1455   CALCIUM 8.5 (L) 06/16/2022 0057   GFRNONAA 56 (L) 06/16/2022 0057   GFRNONAA 56 (L) 03/09/2022 1233   GFRNONAA 87 04/02/2013 1632   GFRAA >60 12/17/2019 0354   GFRAA >89 04/02/2013 1632    INR    Component Value Date/Time   INR 1.0 01/11/2022 0957     Intake/Output Summary (Last 24 hours) at 06/16/2022 0748 Last data filed at 06/15/2022 1900 Gross per 24 hour  Intake 720 ml  Output  --  Net 720 ml     Assessment/Plan:  72 y.o. male is s/p CO2 Aortogram, and iliac arteriogram with RLE runoff  3 Days Post-Op   No endovascular revasc options for RLE Remains at high risk still for limb loss Re discussed surgery with patient and he remains agreeable to proceed His questions were answered  Plan is for femoral to anterior tibial artery bypass with vein tomorrow in OR with Dr. [name] NPO after midnight Consent order placed   Corrina Suezanne [facility] [phone] 06/16/2022 7:48 AM  I agree with the above.  I have seen and evaluated the patient.  We discussed proceeding with a right femoral tibial bypass graft tomorrow.  We discussed that even with successful revascularization he remains at risk for limb loss.  He does not have endovascular options.  All questions were answered.  We discussed the risk and benefits of surgery.  He will be n.p.o. after midnight.  [name]
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
## 2022-06-16 NOTE — Anesthesia Preprocedure Evaluation (Addendum)
Anesthesia Evaluation  Patient identified by MRN, date of birth, ID band Patient awake    Reviewed: Allergy & Precautions, NPO status , Patient's Chart, lab work & pertinent test results  History of Anesthesia Complications Negative for: history of anesthetic complications  Airway Mallampati: I  TM Distance: >3 FB Neck ROM: Full    Dental  (+) Missing, Dental Advisory Given   Pulmonary neg pulmonary ROS   breath sounds clear to auscultation       Cardiovascular hypertension, Pt. on medications and Pt. on home beta blockers pulmonary hypertension(-) angina + Peripheral Vascular Disease and +CHF (Entresto)  + Valvular Problems/Murmurs (s/p TAVR)  Rhythm:Regular Rate:Normal  05/28/2022 ECHO: EF 25-30%, severely decreased LVF, mild LVH, Grade 2 DD, mild RVF, S/p TAVR, mild MR   Neuro/Psych    GI/Hepatic Neg liver ROS,neg GERD  ,,H/o pancreatitis   Endo/Other  diabetes (glu 150), Insulin Dependent    Renal/GU Renal InsufficiencyRenal disease     Musculoskeletal   Abdominal   Peds  Hematology  (+) Blood dyscrasia (Hb 11.6, plt 348K), anemia plavix   Anesthesia Other Findings   Reproductive/Obstetrics                             Anesthesia Physical Anesthesia Plan  ASA: 4  Anesthesia Plan: General   Post-op Pain Management: Tylenol PO (pre-op)*   Induction: Intravenous  PONV Risk Score and Plan: 2 and Ondansetron and Dexamethasone  Airway Management Planned: Oral ETT  Additional Equipment: Arterial line  Intra-op Plan:   Post-operative Plan: Extubation in OR  Informed Consent: I have reviewed the patients History and Physical, chart, labs and discussed the procedure including the risks, benefits and alternatives for the proposed anesthesia with the patient or authorized representative who has indicated his/her understanding and acceptance.     Dental advisory given  Plan  Discussed with: CRNA and Surgeon  Anesthesia Plan Comments:         Anesthesia Quick Evaluation

## 2022-06-16 NOTE — Progress Notes (Signed)
PROGRESS NOTE    Caleb [name]  [id number] DOB: 23-Jul-1950 DOA: 06/07/2022 PCP: [name], MD     Brief Narrative:  Caleb [name] is a 72 year old man with PVD, DM 2, HTN, AAS s/p TAVR, HFrEF with a EF of 35% send CKD 3 was admitted with recurrent diabetic foot infection and acute on chronic kidney injury.  Workup has revealed acute osteomyelitis in the posterolateral aspect of the calcaneus.  Patient is being treated with gentle IV hydration at 75 cc an hour and doxycycline/Augmentin pending surgery/bone biopsy per ID recommendations.  Patient is being seen by vascular surgery who would like to do an angiogram however we are waiting improvement in creatinine prior to proceeding with dye load needed for the angiogram.  New events last 24 hours / Subjective: Still complaining of pain in right lower extremity  Assessment & Plan:   Principal Problem:   Acute osteomyelitis of right calcaneus ([place]) Active Problems:   Type 2 diabetes mellitus with hyperlipidemia ([place])   Acute kidney injury superimposed on chronic kidney disease ([place])   HTN (hypertension)   Hyperlipidemia   Hyponatremia   Dyslipidemia   PAD (peripheral artery disease) ([facility])   S/P TAVR (transcatheter aortic valve replacement)   Decubitus ulcer of right heel   Diabetic foot ulcer ([place])   Gastroenteritis   Sepsis secondary to acute osteomyelitis right heel and PVD  -Sepsis POA  -Appreciate infectious disease -Continue doxycycline, Augmentin -Aspirin, Plavix, lipitor  -Status post angiogram 3/25.  Vascular surgery input greatly appreciated, no endovascular revascularization option for right lower extremity, he remains at high risk still for limb close,.   AKI on CKD stage IIIb -Baseline Cr 2-2.5 -Appreciate nephrology -Thought to be secondary to hypovolemia while on Entresto -Continue to hold Entresto and Lasix -Nephrology signed off 3/22.  Follow-up with [name] later this  month -Improving  Hyperkalemia -Resolved  Diabetes mellitus type 2 with hyperglycemia -Novolog mix BID, SSI.  Dose adjusted today    DVT prophylaxis:  heparin injection 5,000 Units Start: 06/07/22 2200 SCDs Start: 06/07/22 1348  Code Status: Full Family Communication: None at bedside  Disposition Plan:  Status is: Inpatient Remains inpatient appropriate because: Further vascular surgery planned, recommended for femoral bypass   Antimicrobials:  Anti-infectives (From admission, onward)    Start     Dose/Rate Route Frequency Ordered Stop   06/09/22 1245  doxycycline (VIBRA-TABS) tablet 100 mg        100 mg Oral Every 12 hours 06/09/22 1145     06/09/22 1245  amoxicillin-clavulanate (AUGMENTIN) 500-125 MG per tablet 1 tablet        1 tablet Oral 2 times daily 06/09/22 1145     06/08/22 1600  cefTRIAXone (ROCEPHIN) 2 g in sodium chloride 0.9 % 100 mL IVPB  Status:  Discontinued        2 g 200 mL/hr over 30 Minutes Intravenous Every 24 hours 06/08/22 0852 06/09/22 1145   06/08/22 1200  DAPTOmycin (CUBICIN) 600 mg in sodium chloride 0.9 % IVPB  Status:  Discontinued        8 mg/kg  77.4 kg 124 mL/hr over 30 Minutes Intravenous Every 48 hours 06/08/22 1012 06/09/22 1145   06/07/22 1415  linezolid (ZYVOX) IVPB 600 mg  Status:  Discontinued        600 mg 300 mL/hr over 60 Minutes Intravenous Every 12 hours 06/07/22 1414 06/08/22 0852   06/07/22 1415  ceFEPIme (MAXIPIME) 2 g in sodium chloride 0.9 % 100 mL  IVPB  Status:  Discontinued        2 g 200 mL/hr over 30 Minutes Intravenous Daily 06/07/22 1414 06/08/22 0852        Objective: Vitals:   06/16/22 0424 06/16/22 0429 06/16/22 0500 06/16/22 0802  BP:   130/87 (!) 134/90  Pulse:  80  86  Resp: 18   17  Temp: 98.2 F (36.8 C)   (!) 97.2 F (36.2 C)  TempSrc: Oral   Axillary  SpO2:  100%  100%  Weight: 83.4 kg     Height:        Intake/Output Summary (Last 24 hours) at 06/16/2022 1319 Last data filed at 06/15/2022  1900 Gross per 24 hour  Intake 240 ml  Output --  Net 240 ml   Filed Weights   06/14/22 0629 06/15/22 0706 06/16/22 0424  Weight: 69.6 kg 84 kg 83.4 kg    Examination:   Awake Alert, Oriented X 3, No new F.N deficits, Normal affect Symmetrical Chest wall movement, Good air movement bilaterally, CTAB RRR,No Gallops,Rubs or new Murmurs, No Parasternal Heave +ve B.Sounds, Abd Soft, No tenderness, No rebound - guarding or rigidity. Palpable distal pulses in right lower extremity, right heel is bandaged and Prevalon boot  Data Reviewed: I have personally reviewed following labs and imaging studies  CBC: Recent Labs  Lab 06/11/22 0047 06/12/22 0127 06/13/22 0112 06/14/22 0119 06/16/22 0057  WBC 13.0* 13.7* 12.6* 10.6* 11.8*  NEUTROABS  --   --   --   --  8.1*  HGB 12.8* 12.3* 12.6* 11.7* 11.6*  HCT 38.4* 38.7* 39.0 37.1* 36.4*  MCV 81.4 82.9 82.1 82.8 82.5  PLT 305 301 296 332 0000000   Basic Metabolic Panel: Recent Labs  Lab 06/12/22 0127 06/13/22 0112 06/13/22 0744 06/14/22 0119 06/15/22 0119 06/16/22 0057  NA 129* 130*  --  132* 133* 132*  K 4.4 6.0* 4.6 4.1 4.4 4.0  CL 98 95*  --  99 99 96*  CO2 24 25  --  27 25 27   GLUCOSE 140* 164*  --  123* 110* 124*  BUN 41* 36*  --  25* 21 20  CREATININE 1.51* 1.68*  --  1.33* 1.19 1.35*  CALCIUM 8.5* 8.8*  --  8.5* 8.5* 8.5*   GFR: Estimated Creatinine Clearance: 51.1 mL/min (A) (by C-G formula based on SCr of 1.35 mg/dL (H)). Liver Function Tests: No results for input(s): "AST", "ALT", "ALKPHOS", "BILITOT", "PROT", "ALBUMIN" in the last 168 hours. No results for input(s): "LIPASE", "AMYLASE" in the last 168 hours. No results for input(s): "AMMONIA" in the last 168 hours. Coagulation Profile: No results for input(s): "INR", "PROTIME" in the last 168 hours. Cardiac Enzymes: No results for input(s): "CKTOTAL", "CKMB", "CKMBINDEX", "TROPONINI" in the last 168 hours. BNP (last 3 results) No results for input(s): "PROBNP"  in the last 8760 hours. HbA1C: No results for input(s): "HGBA1C" in the last 72 hours. CBG: Recent Labs  Lab 06/15/22 1152 06/15/22 1708 06/15/22 2115 06/16/22 0635 06/16/22 1107  GLUCAP 182* 110* 153* 133* 143*   Lipid Profile: Recent Labs    06/14/22 0119  CHOL 88  HDL 33*  LDLCALC 43  TRIG 60  CHOLHDL 2.7   Thyroid Function Tests: No results for input(s): "TSH", "T4TOTAL", "FREET4", "T3FREE", "THYROIDAB" in the last 72 hours. Anemia Panel: No results for input(s): "VITAMINB12", "FOLATE", "FERRITIN", "TIBC", "IRON", "RETICCTPCT" in the last 72 hours. Sepsis Labs: No results for input(s): "PROCALCITON", "LATICACIDVEN" in the last 168 hours.  Recent Results (from the past 240 hour(s))  Culture, blood (Routine X 2) w Reflex to ID Panel     Status: None   Collection Time: 06/08/22  8:28 AM   Specimen: BLOOD  Result Value Ref Range Status   Specimen Description   Final    BLOOD RIGHT ANTECUBITAL Performed at [facility] [address] [place], [place] [address]    Special Requests   Final    BOTTLES DRAWN AEROBIC ONLY Blood Culture adequate volume Performed at [facility] [address] [place], [place] [address]    Culture   Final    NO GROWTH 5 DAYS Performed at [facility], [place] [address] [place], [place] [address]    Report Status 06/13/2022 FINAL  Final  Culture, blood (Routine X 2) w Reflex to ID Panel     Status: None   Collection Time: 06/08/22  8:28 AM   Specimen: BLOOD RIGHT HAND  Result Value Ref Range Status   Specimen Description   Final    BLOOD RIGHT HAND Performed at [facility] [address], [place], [place] [address]    Special Requests   Final    BOTTLES DRAWN AEROBIC ONLY Blood Culture adequate volume Performed at [facility] [address], [place], [place] [address]    Culture   Final    NO GROWTH 5 DAYS Performed at [facility],  [place] [address], [place],  [address]    Report Status 06/13/2022 FINAL  Final      Radiology Studies: DG Abd 2 Views  Result Date: 06/15/2022 CLINICAL DATA:  Vomiting. EXAM: ABDOMEN - 2 VIEW COMPARISON:  None Available. FINDINGS: There is no significant bowel dilation to suggest obstruction. Multiple air-fluid levels are noted on the erect view, however. There is no free air. Soft tissues are poorly defined.  No gross soft tissue abnormality. IMPRESSION: 1. Multiple air-fluid levels on the erect view within nondistended bowel. Findings suspected to be a mild diffuse adynamic ileus or gastroenteritis. No convincing bowel obstruction. Electronically Signed   By: Lajean Manes M.D.   On: 06/15/2022 18:33      Scheduled Meds:  amoxicillin-clavulanate  1 tablet Oral BID   aspirin  81 mg Oral Daily   atorvastatin  40 mg Oral Daily   carvedilol  12.5 mg Oral BID WC   cholecalciferol  1,000 Units Oral Daily   cyanocobalamin  500 mcg Oral q AM   docusate sodium  100 mg Oral BID   doxycycline  100 mg Oral Q12H   ferrous sulfate  325 mg Oral Q breakfast   furosemide  40 mg Oral q1600   furosemide  80 mg Oral q AM   heparin  5,000 Units Subcutaneous Q8H   hydrALAZINE  25 mg Oral TID   insulin aspart  0-15 Units Subcutaneous TID WC   insulin aspart  0-5 Units Subcutaneous QHS   insulin aspart protamine- aspart  9 Units Subcutaneous BID WC   isosorbide mononitrate  30 mg Oral Daily   sacubitril-valsartan  1 tablet Oral BID   sodium chloride flush  3 mL Intravenous Q12H   Continuous Infusions:  sodium chloride Stopped (06/13/22 1553)   sodium chloride       LOS: 9 days     Phillips Climes, MD [facility] 06/16/2022, 1:19 PM   Available via Epic secure chat 7am-7pm After these hours, please refer to coverage provider listed on  [email or web address]

## 2022-06-16 NOTE — Progress Notes (Signed)
Physical Therapy Treatment Patient Details Name: Caleb [name] MRN: [id number] DOB: 1951/02/22 Today's Date: 06/16/2022   History of Present Illness Pt is a 72 y.o. male admitted 3/19 with R calcaneal osteomyelitis. Pt underwent RLE angiogram 3/25. Tentative surgical plan for RLE bypass 3/29. PMH: IDDM, GERD, HTN, hypercholesterolemia, hyponatremia, chronic systolic CHF with ejection fraction 35 to 40%, h/o TAVR, PVD, neuropathy.    PT Comments    Pt received in supine, agreeable to therapy session with emphasis on transfer, gait and stair negotiation. Pt able to perform 7" platform step in room to simulate curb step-ups with up to min guard assist and cues for sequencing steps to reduce R heel pain. Pt instructed on step-to pain with RW and offloading R heel, he may benefit from heel offloading post-op shoe, RN/MD (Attending and ortho MD) notified. Pt continues to benefit from PT services to progress toward functional mobility goals.    Recommendations for follow up therapy are one component of a multi-disciplinary discharge planning process, led by the attending physician.  Recommendations may be updated based on patient status, additional functional criteria and insurance authorization.  Follow Up Recommendations       Assistance Recommended at Discharge Intermittent Supervision/Assistance  Patient can return home with the following A little help with bathing/dressing/bathroom;Assistance with cooking/housework;Assist for transportation;Help with stairs or ramp for entrance;A little help with walking and/or transfers   Equipment Recommendations  Rolling walker (2 wheels);BSC/3in1    Recommendations for Other Services       Precautions / Restrictions Precautions Precautions: Other (comment) Precaution Comments: R heel pain Restrictions Weight Bearing Restrictions: No     Mobility  Bed Mobility Overal bed mobility: Needs Assistance Bed Mobility: Sit to Supine, Supine to Sit      Supine to sit: Supervision, HOB elevated Sit to supine: Supervision, HOB elevated   General bed mobility comments: +rail, increased time, no physical assist needed, min cues for positioning.    Transfers Overall transfer level: Needs assistance Equipment used: Rolling walker (2 wheels) Transfers: Sit to/from Stand Sit to Stand: Min guard           General transfer comment: min guard for safety, pt takes a minute to feel balanced due to chronic neuropathy.    Ambulation/Gait Ambulation/Gait assistance: Min guard Gait Distance (Feet): 15 Feet Assistive device: Rolling walker (2 wheels) Gait Pattern/deviations: Step-to pattern, Decreased stride length, Antalgic, Decreased weight shift to right, Decreased dorsiflexion - right       General Gait Details: pain limiting his standing tolerance, main emphasis on stairs/step sequencing and reduced pain with stairs/gait training. Discussed modifications such as step-to pattern with offloading heel for reduced R heel pain and possibly heel offloading post-op shoe, RN/MD notified. SpO2/HR WFL on RA.   Stairs Stairs: Yes Stairs assistance: Min guard Stair Management: Two rails, Step to pattern, Forwards (RW to simulate bil rails) Number of Stairs: 5 General stair comments: single 7" platform step in room x5 reps, cues for step sequencing for reduced pain; to simulate curb step, pt reports no stairs to enter his home currently. BP stable pre/post.   Wheelchair Mobility    Modified Rankin (Stroke Patients Only)       Balance Overall balance assessment: Needs assistance Sitting-balance support: Feet supported, No upper extremity supported Sitting balance-Leahy Scale: Good     Standing balance support: Bilateral upper extremity supported, No upper extremity supported, During functional activity Standing balance-Leahy Scale: Fair Standing balance comment: RW to offload RLE during mobility  Cognition Arousal/Alertness: Awake/alert Behavior During Therapy: WFL for tasks assessed/performed Overall Cognitive Status: Within Functional Limits for tasks assessed                                 General Comments: pleasant and cooperative throughout. Discussed end demoed modifications for new technique prior to pt attempting novel tasks (stair ascent/different WB technique) which pt seemed to appreciate. Pt eager to try anything that will help reduce his R heel pain, including a heel offloading post-op shoe, RN/MD notified of pt request.        Exercises      General Comments General comments (skin integrity, edema, etc.): BP 129/88 HR 87 bpm supine prior to gait/stair negotiation; BP 125/90 seated post-exertion; SpO2 97% on RA, HR 89 bpm seated post-exertion. No dizziness in standing this date.      Pertinent Vitals/Pain Pain Assessment Pain Assessment: Faces Faces Pain Scale: Hurts whole lot Pain Location: R foot Pain Descriptors / Indicators: Discomfort, Sharp, Tingling, Shooting Pain Intervention(s): Limited activity within patient's tolerance, Monitored during session, Repositioned, Patient requesting pain meds-RN notified     PT Goals (current goals can now be found in the care plan section) Acute Rehab PT Goals Patient Stated Goal: home, independence PT Goal Formulation: With patient Time For Goal Achievement: 06/27/22 Progress towards PT goals: Progressing toward goals    Frequency    Min 1X/week      PT Plan Current plan remains appropriate       AM-PAC PT "6 Clicks" Mobility   Outcome Measure  Help needed turning from your back to your side while in a flat bed without using bedrails?: None Help needed moving from lying on your back to sitting on the side of a flat bed without using bedrails?: A Little Help needed moving to and from a bed to a chair (including a wheelchair)?: A Little Help needed standing up from a chair using your arms  (e.g., wheelchair or bedside chair)?: A Little Help needed to walk in hospital room?: A Little Help needed climbing 3-5 steps with a railing? : A Little 6 Click Score: 19    End of Session Equipment Utilized During Treatment: Gait belt Activity Tolerance: Patient limited by pain;Patient tolerated treatment well (agreeable to try mobility OOB, but gait distance limited due to pain) Patient left: in bed;with call bell/phone within reach;with bed alarm set;Other (comment) (prevalon boot for R heel/elevation; bed in chair posture as pt preparing to eat dinner.) Nurse Communication: Mobility status;Patient requests pain meds;Other (comment) (pt requesting heel offloading shoe for RLE) PT Visit Diagnosis: Difficulty in walking, not elsewhere classified (R26.2);Pain Pain - Right/Left: Right Pain - part of body: Ankle and joints of foot     Time: [id number] PT Time Calculation (min) (ACUTE ONLY): 33 min  Charges:  $Gait Training: 8-22 mins $Therapeutic Activity: 8-22 mins                     Raoul Ciano P., PTA Acute Rehabilitation Services Secure Chat Preferred 9a-5:30pm Office: [place] 06/16/2022, 5:51 PM

## 2022-06-16 NOTE — Progress Notes (Addendum)
[facility] for Infectious Disease    Date of Admission:  06/07/2022     ID: Caleb [name] is a 72 y.o. male with   Principal Problem:   Acute osteomyelitis of right calcaneus ([place]) Active Problems:   HTN (hypertension)   Hyperlipidemia   Hyponatremia   Dyslipidemia   Type 2 diabetes mellitus with hyperlipidemia ([facility])   Acute kidney injury superimposed on chronic kidney disease ([facility])   PAD (peripheral artery disease) ([facility])   S/P TAVR (transcatheter aortic valve replacement)   Decubitus ulcer of right heel   Diabetic foot ulcer ([place])   Gastroenteritis    Subjective: Feeling better than yesterday. No nausea. Less diarrhea after stopping stool softener. Able to eat meals without difficulty. "Getting bypass tomorrow"  Medications:   amoxicillin-clavulanate  1 tablet Oral BID   aspirin  81 mg Oral Daily   atorvastatin  40 mg Oral Daily   carvedilol  12.5 mg Oral BID WC   cholecalciferol  1,000 Units Oral Daily   cyanocobalamin  500 mcg Oral q AM   docusate sodium  100 mg Oral BID   doxycycline  100 mg Oral BID PC   ferrous sulfate  325 mg Oral Q breakfast   furosemide  40 mg Oral q1600   furosemide  80 mg Oral q AM   heparin  5,000 Units Subcutaneous Q8H   hydrALAZINE  25 mg Oral TID   insulin aspart  0-15 Units Subcutaneous TID WC   insulin aspart  0-5 Units Subcutaneous QHS   insulin aspart protamine- aspart  9 Units Subcutaneous BID WC   isosorbide mononitrate  30 mg Oral Daily   sacubitril-valsartan  1 tablet Oral BID   sodium chloride flush  3 mL Intravenous Q12H    Objective: Vital signs in last 24 hours: Temp:  [97.2 F (36.2 C)-98.4 F (36.9 C)] 97.2 F (36.2 C) (03/28 0802) Pulse Rate:  [80-86] 86 (03/28 0802) Resp:  [17-18] 17 (03/28 0802) BP: (130-135)/(87-96) 134/90 (03/28 0802) SpO2:  [99 %-100 %] 100 % (03/28 0802) Weight:  [83.4 kg] 83.4 kg (03/28 0424) Physical Exam  Constitutional: He is oriented to person, place, and time. He appears  well-developed and well-nourished. No distress.  HENT:  Mouth/Throat: Oropharynx is clear and moist. No oropharyngeal exudate.  Cardiovascular: Normal rate, regular rhythm and normal heart sounds. Exam reveals no gallop and no friction rub.  No murmur heard.  Pulmonary/Chest: Effort normal and breath sounds normal. No respiratory distress. He has no wheezes.  Abdominal: Soft. Bowel sounds are normal. He exhibits no distension. There is no tenderness.  [id number] foot wrapped and off loading Neurological: He is alert and oriented to person, place, and time.  Skin: Skin is warm and dry. No rash noted. No erythema.  Psychiatric: He has a normal mood and affect. His behavior is normal.    Lab Results Recent Labs    06/14/22 0119 06/15/22 0119 06/16/22 0057  WBC 10.6*  --  11.8*  HGB 11.7*  --  11.6*  HCT 37.1*  --  36.4*  NA 132* 133* 132*  K 4.1 4.4 4.0  CL 99 99 96*  CO2 27 25 27   BUN 25* 21 20  CREATININE 1.33* 1.19 1.35*    Microbiology: reviewed Studies/Results: DG Abd 2 Views  Result Date: 06/15/2022 CLINICAL DATA:  Vomiting. EXAM: ABDOMEN - 2 VIEW COMPARISON:  None Available. FINDINGS: There is no significant bowel dilation to suggest obstruction. Multiple air-fluid levels are noted on  the erect view, however. There is no free air. Soft tissues are poorly defined.  No gross soft tissue abnormality. IMPRESSION: 1. Multiple air-fluid levels on the erect view within nondistended bowel. Findings suspected to be a mild diffuse adynamic ileus or gastroenteritis. No convincing bowel obstruction. Electronically Signed   By: Lajean Manes M.D.   On: 06/15/2022 18:33     Assessment/Plan: PAD = getting bypass surgery tomorrow. Will start CHG bath today  Right calcaneal osteomyelitis = continue on doxy and amox/clav  Nausea = could be due to doxy, will switch schedule to after meals, and still use prn anti emetic  Diarrhea = low suspicion for cdiff. Do not test at this  time.  [facility] for Infectious Diseases Pager: [phone]  06/16/2022, 4:58 PM

## 2022-06-17 ENCOUNTER — Inpatient Hospital Stay (HOSPITAL_COMMUNITY): Payer: Medicare HMO | Admitting: Anesthesiology

## 2022-06-17 ENCOUNTER — Encounter (HOSPITAL_COMMUNITY)
Admission: EM | Disposition: A | Discharge status: Skilled Nursing Facility | Payer: Self-pay | Source: Home / Self Care | Attending: Family Medicine

## 2022-06-17 ENCOUNTER — Inpatient Hospital Stay (HOSPITAL_COMMUNITY): Payer: Medicare HMO

## 2022-06-17 ENCOUNTER — Other Ambulatory Visit: Payer: Self-pay

## 2022-06-17 ENCOUNTER — Encounter (HOSPITAL_COMMUNITY): Payer: Self-pay | Admitting: Internal Medicine

## 2022-06-17 DIAGNOSIS — I70221 Atherosclerosis of native arteries of extremities with rest pain, right leg: Secondary | ICD-10-CM

## 2022-06-17 DIAGNOSIS — I509 Heart failure, unspecified: Secondary | ICD-10-CM | POA: Diagnosis not present

## 2022-06-17 DIAGNOSIS — I70234 Atherosclerosis of native arteries of right leg with ulceration of heel and midfoot: Secondary | ICD-10-CM

## 2022-06-17 DIAGNOSIS — I110 Hypertensive heart disease with heart failure: Secondary | ICD-10-CM

## 2022-06-17 DIAGNOSIS — M86171 Other acute osteomyelitis, right ankle and foot: Secondary | ICD-10-CM | POA: Diagnosis not present

## 2022-06-17 DIAGNOSIS — E1151 Type 2 diabetes mellitus with diabetic peripheral angiopathy without gangrene: Secondary | ICD-10-CM

## 2022-06-17 DIAGNOSIS — I272 Pulmonary hypertension, unspecified: Secondary | ICD-10-CM

## 2022-06-17 DIAGNOSIS — Z794 Long term (current) use of insulin: Secondary | ICD-10-CM

## 2022-06-17 HISTORY — PX: FEMORAL-TIBIAL BYPASS GRAFT: SHX938

## 2022-06-17 HISTORY — PX: VEIN HARVEST: SHX6363

## 2022-06-17 HISTORY — PX: ENDARTERECTOMY FEMORAL: SHX5804

## 2022-06-17 LAB — POCT ACTIVATED CLOTTING TIME
Activated Clotting Time: 244 seconds
Activated Clotting Time: 223 seconds
Activated Clotting Time: 255 seconds
Activated Clotting Time: 163 seconds
Activated Clotting Time: 201 seconds
Activated Clotting Time: 250 seconds

## 2022-06-17 LAB — CBC
HCT: 34.2 % — ABNORMAL LOW (ref 39.0–52.0)
Hemoglobin: 11.2 g/dL — ABNORMAL LOW (ref 13.0–17.0)
MCH: 26.5 pg (ref 26.0–34.0)
MCHC: 32.7 g/dL (ref 30.0–36.0)
MCV: 81 fL (ref 80.0–100.0)
Platelets: 323 10*3/uL (ref 150–400)
RBC: 4.22 MIL/uL (ref 4.22–5.81)
RDW: 16 % — ABNORMAL HIGH (ref 11.5–15.5)
WBC: 10.9 10*3/uL — ABNORMAL HIGH (ref 4.0–10.5)
nRBC: 0 % (ref 0.0–0.2)

## 2022-06-17 LAB — BASIC METABOLIC PANEL
Anion gap: 9 (ref 5–15)
BUN: 15 mg/dL (ref 8–23)
CO2: 25 mmol/L (ref 22–32)
Calcium: 8.2 mg/dL — ABNORMAL LOW (ref 8.9–10.3)
Chloride: 99 mmol/L (ref 98–111)
Creatinine, Ser: 1.19 mg/dL (ref 0.61–1.24)
GFR, Estimated: >60 mL/min (ref >60)
Glucose, Bld: 106 mg/dL — ABNORMAL HIGH (ref 70–99)
Potassium: 3.9 mmol/L (ref 3.5–5.1)
Sodium: 133 mmol/L — ABNORMAL LOW (ref 135–145)

## 2022-06-17 LAB — GLUCOSE, CAPILLARY
Glucose-Capillary: 150 mg/dL — ABNORMAL HIGH (ref 70–99)
Glucose-Capillary: 165 mg/dL — ABNORMAL HIGH (ref 70–99)
Glucose-Capillary: 160 mg/dL — ABNORMAL HIGH (ref 70–99)
Glucose-Capillary: 116 mg/dL — ABNORMAL HIGH (ref 70–99)
Glucose-Capillary: 98 mg/dL (ref 70–99)
Glucose-Capillary: 104 mg/dL — ABNORMAL HIGH (ref 70–99)
Glucose-Capillary: 83 mg/dL (ref 70–99)
Glucose-Capillary: 94 mg/dL (ref 70–99)
Glucose-Capillary: 142 mg/dL — ABNORMAL HIGH (ref 70–99)
Glucose-Capillary: 158 mg/dL — ABNORMAL HIGH (ref 70–99)

## 2022-06-17 LAB — SURGICAL PCR SCREEN
MRSA, PCR: NEGATIVE
Staphylococcus aureus: NEGATIVE

## 2022-06-17 SURGERY — CREATION, BYPASS, ARTERIAL, FEMORAL TO TIBIAL, USING GRAFT
Anesthesia: General | Site: Leg Upper | Laterality: Right

## 2022-06-17 MED ORDER — ONDANSETRON HCL 4 MG/2ML IJ SOLN
INTRAMUSCULAR | Status: DC | PRN
  Administered 2022-06-17: 4 mg via INTRAVENOUS

## 2022-06-17 MED ORDER — PROPOFOL 10 MG/ML IV BOLUS
INTRAVENOUS | Status: AC
  Filled 2022-06-17: qty 20

## 2022-06-17 MED ORDER — PAPAVERINE HCL 30 MG/ML IJ SOLN: INTRAMUSCULAR | Status: DC | PRN

## 2022-06-17 MED ORDER — ROCURONIUM BROMIDE 10 MG/ML (PF) SYRINGE
PREFILLED_SYRINGE | INTRAVENOUS | Status: AC
  Filled 2022-06-17: qty 10

## 2022-06-17 MED ORDER — LIDOCAINE 2% (20 MG/ML) 5 ML SYRINGE
INTRAMUSCULAR | Status: AC
  Filled 2022-06-17: qty 5

## 2022-06-17 MED ORDER — FENTANYL CITRATE (PF) 250 MCG/5ML IJ SOLN
INTRAMUSCULAR | Status: AC
  Filled 2022-06-17: qty 5

## 2022-06-17 MED ORDER — POTASSIUM CHLORIDE CRYS ER 20 MEQ PO TBCR: 20.0000 meq | EXTENDED_RELEASE_TABLET | Freq: Every day | ORAL | Status: DC | PRN

## 2022-06-17 MED ORDER — ALBUMIN HUMAN 5 % IV SOLN: INTRAVENOUS | Status: DC | PRN

## 2022-06-17 MED ORDER — EPHEDRINE SULFATE-NACL 50-0.9 MG/10ML-% IV SOSY
PREFILLED_SYRINGE | INTRAVENOUS | Status: DC | PRN
  Administered 2022-06-17 (×5): 2.5 mg via INTRAVENOUS

## 2022-06-17 MED ORDER — SUGAMMADEX SODIUM 200 MG/2ML IV SOLN
INTRAVENOUS | Status: DC | PRN
  Administered 2022-06-17: 200 mg via INTRAVENOUS

## 2022-06-17 MED ORDER — OXYCODONE HCL 5 MG/5ML PO SOLN: 5.0000 mg | Freq: Once | ORAL | Status: DC | PRN

## 2022-06-17 MED ORDER — MAGNESIUM SULFATE 2 GM/50ML IV SOLN: 2.0000 g | Freq: Every day | INTRAVENOUS | Status: DC | PRN

## 2022-06-17 MED ORDER — HEPARIN 6000 UNIT IRRIGATION SOLUTION
Status: AC
  Filled 2022-06-17: qty 500

## 2022-06-17 MED ORDER — HEPARIN (PORCINE) 25000 UT/250ML-% IV SOLN
1800.0000 [IU]/h | INTRAVENOUS | Status: DC
  Administered 2022-06-17 – 2022-06-21 (×3): 500 [IU]/h via INTRAVENOUS
  Administered 2022-06-23: 1150 [IU]/h via INTRAVENOUS
  Administered 2022-06-23: 1600 [IU]/h via INTRAVENOUS
  Administered 2022-06-24: 1700 [IU]/h via INTRAVENOUS
  Filled 2022-06-17 (×8): qty 250

## 2022-06-17 MED ORDER — GUAIFENESIN-DM 100-10 MG/5ML PO SYRP: 15.0000 mL | ORAL_SOLUTION | ORAL | Status: DC | PRN

## 2022-06-17 MED ORDER — LABETALOL HCL 5 MG/ML IV SOLN: 10.0000 mg | INTRAVENOUS | Status: DC | PRN

## 2022-06-17 MED ORDER — MEPERIDINE HCL 25 MG/ML IJ SOLN: 6.2500 mg | INTRAMUSCULAR | Status: DC | PRN

## 2022-06-17 MED ORDER — 0.9 % SODIUM CHLORIDE (POUR BTL) OPTIME
TOPICAL | Status: DC | PRN
  Administered 2022-06-17: 1000 mL

## 2022-06-17 MED ORDER — PROTAMINE SULFATE 10 MG/ML IV SOLN
INTRAVENOUS | Status: DC | PRN
  Administered 2022-06-17 (×2): 20 mg via INTRAVENOUS

## 2022-06-17 MED ORDER — PAPAVERINE HCL 30 MG/ML IJ SOLN
INTRAMUSCULAR | Status: AC
  Filled 2022-06-17: qty 2

## 2022-06-17 MED ORDER — INSULIN REGULAR(HUMAN) IN NACL 100-0.9 UT/100ML-% IV SOLN
INTRAVENOUS | Status: DC | PRN
  Administered 2022-06-17: 4.2 [IU]/h via INTRAVENOUS

## 2022-06-17 MED ORDER — PROMETHAZINE HCL 25 MG/ML IJ SOLN: 6.2500 mg | INTRAMUSCULAR | Status: DC | PRN

## 2022-06-17 MED ORDER — ROCURONIUM BROMIDE 100 MG/10ML IV SOLN
INTRAVENOUS | Status: DC | PRN
  Administered 2022-06-17: 20 mg via INTRAVENOUS
  Administered 2022-06-17: 60 mg via INTRAVENOUS
  Administered 2022-06-17: 20 mg via INTRAVENOUS

## 2022-06-17 MED ORDER — MIDAZOLAM HCL 2 MG/2ML IJ SOLN: 0.5000 mg | Freq: Once | INTRAMUSCULAR | Status: DC | PRN

## 2022-06-17 MED ORDER — HYDROMORPHONE HCL 1 MG/ML IJ SOLN: 0.2500 mg | INTRAMUSCULAR | Status: DC | PRN

## 2022-06-17 MED ORDER — SODIUM CHLORIDE 0.9 % IV SOLN: 500.0000 mL | Freq: Once | INTRAVENOUS | Status: DC | PRN

## 2022-06-17 MED ORDER — HEPARIN SODIUM (PORCINE) 1000 UNIT/ML IJ SOLN
INTRAMUSCULAR | Status: AC
  Filled 2022-06-17: qty 10

## 2022-06-17 MED ORDER — SODIUM CHLORIDE 0.9 % IV SOLN: INTRAVENOUS | Status: AC

## 2022-06-17 MED ORDER — VECURONIUM BROMIDE 10 MG IV SOLR
INTRAVENOUS | Status: AC
  Filled 2022-06-17: qty 10

## 2022-06-17 MED ORDER — ACETAMINOPHEN 325 MG PO TABS
325.0000 mg | ORAL_TABLET | ORAL | Status: DC | PRN
  Administered 2022-06-24 – 2022-06-25 (×3): 650 mg via ORAL
  Filled 2022-06-17 (×3): qty 2

## 2022-06-17 MED ORDER — CEFAZOLIN SODIUM-DEXTROSE 2-4 GM/100ML-% IV SOLN
INTRAVENOUS | Status: AC
  Filled 2022-06-17: qty 100

## 2022-06-17 MED ORDER — METOPROLOL TARTRATE 5 MG/5ML IV SOLN: 2.0000 mg | INTRAVENOUS | Status: DC | PRN

## 2022-06-17 MED ORDER — PHENYLEPHRINE 80 MCG/ML (10ML) SYRINGE FOR IV PUSH (FOR BLOOD PRESSURE SUPPORT)
PREFILLED_SYRINGE | INTRAVENOUS | Status: DC | PRN
  Administered 2022-06-17 (×5): 80 ug via INTRAVENOUS

## 2022-06-17 MED ORDER — HEPARIN SODIUM (PORCINE) 1000 UNIT/ML IJ SOLN
INTRAMUSCULAR | Status: DC | PRN
  Administered 2022-06-17: 8000 [IU] via INTRAVENOUS
  Administered 2022-06-17 (×2): 2000 [IU] via INTRAVENOUS
  Administered 2022-06-17: 5000 [IU] via INTRAVENOUS

## 2022-06-17 MED ORDER — PROPOFOL 10 MG/ML IV BOLUS
INTRAVENOUS | Status: DC | PRN
  Administered 2022-06-17: 30 mg via INTRAVENOUS
  Administered 2022-06-17: 100 mg via INTRAVENOUS
  Administered 2022-06-17: 20 mg via INTRAVENOUS

## 2022-06-17 MED ORDER — FENTANYL CITRATE (PF) 100 MCG/2ML IJ SOLN
INTRAMUSCULAR | Status: DC | PRN
  Administered 2022-06-17 (×3): 50 ug via INTRAVENOUS
  Administered 2022-06-17: 150 ug via INTRAVENOUS

## 2022-06-17 MED ORDER — ACETAMINOPHEN 650 MG RE SUPP: 325.0000 mg | RECTAL | Status: DC | PRN

## 2022-06-17 MED ORDER — CEFAZOLIN SODIUM-DEXTROSE 2-3 GM-%(50ML) IV SOLR
INTRAVENOUS | Status: DC | PRN
  Administered 2022-06-17 (×2): 2 g via INTRAVENOUS

## 2022-06-17 MED ORDER — HYDRALAZINE HCL 20 MG/ML IJ SOLN: 5.0000 mg | INTRAMUSCULAR | Status: DC | PRN

## 2022-06-17 MED ORDER — PHENYLEPHRINE HCL-NACL 20-0.9 MG/250ML-% IV SOLN
INTRAVENOUS | Status: DC | PRN
  Administered 2022-06-17: 20 ug/min via INTRAVENOUS

## 2022-06-17 MED ORDER — EPHEDRINE 5 MG/ML INJ
INTRAVENOUS | Status: AC
  Filled 2022-06-17: qty 5

## 2022-06-17 MED ORDER — LACTATED RINGERS IV SOLN: INTRAVENOUS | Status: DC | PRN

## 2022-06-17 MED ORDER — OXYCODONE HCL 5 MG PO TABS: 5.0000 mg | ORAL_TABLET | Freq: Once | ORAL | Status: DC | PRN

## 2022-06-17 MED ORDER — IODIXANOL 320 MG/ML IV SOLN: INTRAVENOUS | Status: DC | PRN

## 2022-06-17 MED ORDER — ALUM & MAG HYDROXIDE-SIMETH 200-200-20 MG/5ML PO SUSP: 15.0000 mL | ORAL | Status: DC | PRN

## 2022-06-17 MED ORDER — VECURONIUM BROMIDE 10 MG IV SOLR
INTRAVENOUS | Status: DC | PRN
  Administered 2022-06-17: 1 mg via INTRAVENOUS
  Administered 2022-06-17: 2 mg via INTRAVENOUS
  Administered 2022-06-17 (×2): 1 mg via INTRAVENOUS

## 2022-06-17 MED ORDER — LIDOCAINE 2% (20 MG/ML) 5 ML SYRINGE
INTRAMUSCULAR | Status: DC | PRN
  Administered 2022-06-17: 25 mg via INTRAVENOUS

## 2022-06-17 MED ORDER — HEMOSTATIC AGENTS (NO CHARGE) OPTIME
TOPICAL | Status: DC | PRN
  Administered 2022-06-17 (×2): 1 via TOPICAL
  Administered 2022-06-17: 2 via TOPICAL
  Administered 2022-06-17: 3 via TOPICAL

## 2022-06-17 MED ORDER — ETOMIDATE 2 MG/ML IV SOLN
INTRAVENOUS | Status: AC
  Filled 2022-06-17: qty 10

## 2022-06-17 MED ORDER — HEPARIN 6000 UNIT IRRIGATION SOLUTION
Status: DC | PRN
  Administered 2022-06-17: 1

## 2022-06-17 MED ORDER — CEFAZOLIN SODIUM 1 G IJ SOLR
INTRAMUSCULAR | Status: AC
  Filled 2022-06-17: qty 20

## 2022-06-17 MED ORDER — PHENYLEPHRINE 80 MCG/ML (10ML) SYRINGE FOR IV PUSH (FOR BLOOD PRESSURE SUPPORT)
PREFILLED_SYRINGE | INTRAVENOUS | Status: AC
  Filled 2022-06-17: qty 10

## 2022-06-17 MED ORDER — ONDANSETRON HCL 4 MG/2ML IJ SOLN
INTRAMUSCULAR | Status: AC
  Filled 2022-06-17: qty 2

## 2022-06-17 MED ORDER — HYDROMORPHONE HCL 1 MG/ML IJ SOLN
0.5000 mg | INTRAMUSCULAR | Status: DC | PRN
  Administered 2022-06-17: 0.5 mg via INTRAVENOUS
  Administered 2022-06-17: 1 mg via INTRAVENOUS
  Administered 2022-06-18: 0.5 mg via INTRAVENOUS
  Administered 2022-06-18: 1 mg via INTRAVENOUS
  Administered 2022-06-18 – 2022-06-20 (×9): 0.5 mg via INTRAVENOUS
  Administered 2022-06-21 – 2022-06-24 (×12): 1 mg via INTRAVENOUS
  Filled 2022-06-17 (×3): qty 1
  Filled 2022-06-17: qty 0.5
  Filled 2022-06-17: qty 1
  Filled 2022-06-17 (×3): qty 0.5
  Filled 2022-06-17: qty 1
  Filled 2022-06-17: qty 0.5
  Filled 2022-06-17 (×5): qty 1
  Filled 2022-06-17 (×4): qty 0.5
  Filled 2022-06-17 (×6): qty 1
  Filled 2022-06-17: qty 0.5

## 2022-06-17 MED ORDER — PROTAMINE SULFATE 10 MG/ML IV SOLN
INTRAVENOUS | Status: AC
  Filled 2022-06-17: qty 5

## 2022-06-17 MED ORDER — PANTOPRAZOLE SODIUM 40 MG PO TBEC
40.0000 mg | DELAYED_RELEASE_TABLET | Freq: Every day | ORAL | Status: DC
  Administered 2022-06-17 – 2022-06-30 (×14): 40 mg via ORAL
  Filled 2022-06-17 (×14): qty 1

## 2022-06-17 MED ORDER — SODIUM CHLORIDE (PF) 0.9 % IJ SOLN
INTRAMUSCULAR | Status: AC
  Filled 2022-06-17: qty 10

## 2022-06-17 SURGICAL SUPPLY — 83 items
ADH SKN CLS APL DERMABOND .7 (GAUZE/BANDAGES/DRESSINGS) ×10
AGENT HMST KT MTR STRL THRMB (HEMOSTASIS) ×4
BAG COUNTER SPONGE SURGICOUNT (BAG) ×3 IMPLANT
BAG SPNG CNTER NS LX DISP (BAG) ×2
BANDAGE ESMARK 6X9 LF (GAUZE/BANDAGES/DRESSINGS) ×1 IMPLANT
BLADE CLIPPER SURG (BLADE) ×3 IMPLANT
BLADE SURG 11 STRL SS (BLADE) ×1 IMPLANT
BNDG CMPR 9X6 STRL LF SNTH (GAUZE/BANDAGES/DRESSINGS) ×2
BNDG CMPR MED 10X6 ELC LF (GAUZE/BANDAGES/DRESSINGS) ×4
BNDG ELASTIC 4X5.8 VLCR STR LF (GAUZE/BANDAGES/DRESSINGS) ×1 IMPLANT
BNDG ELASTIC 6X10 VLCR STRL LF (GAUZE/BANDAGES/DRESSINGS) ×2 IMPLANT
BNDG ESMARK 6X9 LF (GAUZE/BANDAGES/DRESSINGS) ×2
CANISTER SUCT 3000ML PPV (MISCELLANEOUS) ×3 IMPLANT
CANNULA VESSEL 3MM 2 BLNT TIP (CANNULA) ×1 IMPLANT
CLIP LIGATING EXTRA MED SLVR (CLIP) ×2 IMPLANT
CLIP LIGATING EXTRA SM BLUE (MISCELLANEOUS) ×4 IMPLANT
CLIP TI WIDE RED SMALL 24 (CLIP) ×1 IMPLANT
CNTNR URN SCR LID CUP LEK RST (MISCELLANEOUS) ×1 IMPLANT
CONT SPEC 4OZ STRL OR WHT (MISCELLANEOUS) ×2
COVER DOME SNAP 22 D (MISCELLANEOUS) ×1 IMPLANT
COVER PROBE W GEL 5X96 (DRAPES) ×3 IMPLANT
CUFF TOURN SGL QUICK 24 (TOURNIQUET CUFF)
CUFF TOURN SGL QUICK 34 (TOURNIQUET CUFF) ×2
CUFF TOURN SGL QUICK 42 (TOURNIQUET CUFF) IMPLANT
CUFF TRNQT CYL 24X4X16.5-23 (TOURNIQUET CUFF) IMPLANT
CUFF TRNQT CYL 34X4.125X (TOURNIQUET CUFF) ×1 IMPLANT
DERMABOND ADVANCED .7 DNX12 (GAUZE/BANDAGES/DRESSINGS) ×7 IMPLANT
DRAIN CHANNEL 15F RND FF W/TCR (WOUND CARE) IMPLANT
DRAPE C-ARM 42X72 X-RAY (DRAPES) ×3 IMPLANT
DRAPE HALF SHEET 40X57 (DRAPES) IMPLANT
DRSG COVADERM 4X14 (GAUZE/BANDAGES/DRESSINGS) ×1 IMPLANT
DRSG COVADERM 4X8 (GAUZE/BANDAGES/DRESSINGS) ×1 IMPLANT
ELECT REM PT RETURN 9FT ADLT (ELECTROSURGICAL) ×2
ELECTRODE REM PT RTRN 9FT ADLT (ELECTROSURGICAL) ×3 IMPLANT
EVACUATOR SILICONE 100CC (DRAIN) IMPLANT
GAUZE XEROFORM 5X9 LF (GAUZE/BANDAGES/DRESSINGS) ×1 IMPLANT
GLOVE BIO SURGEON STRL SZ 6.5 (GLOVE) ×2 IMPLANT
GLOVE BIO SURGEON STRL SZ7.5 (GLOVE) ×3 IMPLANT
GLOVE BIOGEL PI IND STRL 7.0 (GLOVE) ×1 IMPLANT
GLOVE BIOGEL PI IND STRL 8 (GLOVE) ×3 IMPLANT
GOWN STRL REUS W/ TWL LRG LVL3 (GOWN DISPOSABLE) ×6 IMPLANT
GOWN STRL REUS W/TWL 2XL LVL3 (GOWN DISPOSABLE) ×6 IMPLANT
GOWN STRL REUS W/TWL LRG LVL3 (GOWN DISPOSABLE) ×4
HEMOSTAT SNOW SURGICEL 2X4 (HEMOSTASIS) ×5 IMPLANT
KIT BASIN OR (CUSTOM PROCEDURE TRAY) ×3 IMPLANT
KIT TURNOVER KIT B (KITS) ×3 IMPLANT
LOOP VASCULAR MINI 18 RED (MISCELLANEOUS) ×2
MARKER SKIN DUAL TIP RULER LAB (MISCELLANEOUS) ×2 IMPLANT
NS IRRIG 1000ML POUR BTL (IV SOLUTION) ×6 IMPLANT
PACK PERIPHERAL VASCULAR (CUSTOM PROCEDURE TRAY) ×3 IMPLANT
PAD ARMBOARD 7.5X6 YLW CONV (MISCELLANEOUS) ×6 IMPLANT
PATCH VASC XENOSURE 1CMX6CM (Vascular Products) IMPLANT
PATCH VASC XENOSURE 1X6 (Vascular Products) IMPLANT
PENCIL BUTTON HOLSTER BLD 10FT (ELECTRODE) ×1 IMPLANT
SPONGE INTESTINAL PEANUT (DISPOSABLE) ×1 IMPLANT
SPONGE T-LAP 18X18 ~~LOC~~+RFID (SPONGE) ×1 IMPLANT
STAPLER SKIN 35 WIDE (STAPLE) ×2 IMPLANT
STOPCOCK 4 WAY LG BORE MALE ST (IV SETS) ×3 IMPLANT
STOPCOCK MORSE 400PSI 3WAY (MISCELLANEOUS) ×1 IMPLANT
SURGIFLO W/THROMBIN 8M KIT (HEMOSTASIS) ×2 IMPLANT
SUT MNCRL AB 4-0 PS2 18 (SUTURE) ×10 IMPLANT
SUT PROLENE 5 0 C 1 24 (SUTURE) ×4 IMPLANT
SUT PROLENE 6 0 BV (SUTURE) ×9 IMPLANT
SUT PROLENE 7 0 BV 1 (SUTURE) IMPLANT
SUT SILK 2 0 PERMA HAND 18 BK (SUTURE) IMPLANT
SUT SILK 2 0 SH (SUTURE) ×3 IMPLANT
SUT SILK 2 0 SH CR/8 (SUTURE) ×1 IMPLANT
SUT SILK 3 0 (SUTURE) ×2
SUT SILK 3-0 18XBRD TIE 12 (SUTURE) ×1 IMPLANT
SUT VIC AB 2-0 CT1 27 (SUTURE) ×4
SUT VIC AB 2-0 CT1 TAPERPNT 27 (SUTURE) ×6 IMPLANT
SUT VIC AB 3-0 SH 27 (SUTURE) ×14
SUT VIC AB 3-0 SH 27X BRD (SUTURE) ×11 IMPLANT
SYR 10ML LL (SYRINGE) ×2 IMPLANT
SYR 3ML LL SCALE MARK (SYRINGE) ×2 IMPLANT
TOWEL GREEN STERILE (TOWEL DISPOSABLE) ×3 IMPLANT
TRAY FOLEY MTR SLVR 16FR STAT (SET/KITS/TRAYS/PACK) ×3 IMPLANT
TUBING CIL FLEX 10 FLL-RA (TUBING) ×1 IMPLANT
TUBING EXTENTION W/L.L. (IV SETS) ×3 IMPLANT
UNDERPAD 30X36 HEAVY ABSORB (UNDERPADS AND DIAPERS) ×3 IMPLANT
VASCULAR TIE MINI RED 18IN STL (MISCELLANEOUS) ×1 IMPLANT
WATER STERILE IRR 1000ML POUR (IV SOLUTION) ×3 IMPLANT
WIRE MICRO SET SILHO 5FR 7 (SHEATH) IMPLANT

## 2022-06-17 NOTE — Progress Notes (Signed)
PT Cancellation Note  Patient Details Name: Caleb [name] MRN: [id number] DOB: 06-06-1950   Cancelled Treatment:    Reason Eval/Treat Not Completed: Patient at procedure or test/unavailable   Aldena Worm B Camilla Skeen 06/17/2022, 7:23 AM [place] Office: [phone]

## 2022-06-17 NOTE — Anesthesia Procedure Notes (Signed)
Arterial Line Insertion Start/End3/29/2024 7:15 AM, 06/17/2022 7:30 AM Performed by: Annye Asa, MD, Gwyndolyn Saxon, CRNA, CRNA  Patient location: Pre-op. Preanesthetic checklist: patient identified, IV checked, site marked, risks and benefits discussed, surgical consent, monitors and equipment checked, pre-op evaluation, timeout performed and anesthesia consent Lidocaine 1% used for infiltration Left, radial was placed Catheter size: 20 G Hand hygiene performed , maximum sterile barriers used  and Seldinger technique used Allen's test indicative of satisfactory collateral circulation Attempts: 2 Procedure performed using ultrasound guided technique. Ultrasound Notes:anatomy identified, needle tip was noted to be adjacent to the nerve/plexus identified and no ultrasound evidence of intravascular and/or intraneural injection Following insertion, dressing applied and Biopatch. Post procedure assessment: normal  Patient tolerated the procedure well with no immediate complications. Additional procedure comments: Pt has neuropathy in bilateral hands and fingers. States he can move his fingers but can't really feel them. No change after insertion of A-line.

## 2022-06-17 NOTE — Anesthesia Postprocedure Evaluation (Signed)
Anesthesia Post Note  Patient: Caleb [name]  Procedure(s) Performed: RIGHT FEMORAL- ANTERIOR TIBIAL ARTERY BYPASS (Right: Leg Upper) IPSILATERAL RIGHT GREATER SAPHENOUS VEIN HARVEST (Right: Leg Upper) SUPERFICIAL ENDARTERECTOMY OF COMMON FEMORAL ARTERY (Right: Leg Upper)     Patient location during evaluation: PACU Anesthesia Type: General Level of consciousness: awake and alert, patient cooperative and oriented Pain management: pain level controlled Vital Signs Assessment: post-procedure vital signs reviewed and stable Respiratory status: spontaneous breathing, nonlabored ventilation and respiratory function stable Cardiovascular status: blood pressure returned to baseline and stable Postop Assessment: no apparent nausea or vomiting Anesthetic complications: no   No notable events documented.  Last Vitals:  Vitals:   06/17/22 1500 06/17/22 1508  BP: (!) 142/81   Pulse: 83 84  Resp: 12 12  Temp:  36.7 C  SpO2: 98% 99%    Last Pain:  Vitals:   06/17/22 1445  TempSrc:   PainSc: 0-No pain                 Terie Lear,E. Merilyn Pagan

## 2022-06-17 NOTE — Transfer of Care (Signed)
Immediate Anesthesia Transfer of Care Note  Patient: [name]  Procedure(s) Performed: RIGHT FEMORAL- ANTERIOR TIBIAL ARTERY WITH VEIN (Right: Leg Upper) IPSILATERAL RIGHT GREATER SAPHENOUS VEIN HARVEST (Right: Leg Upper) SUPERFICIAL ENDARTERECTOMY (Right: Leg Upper)  Patient Location: PACU  Anesthesia Type:General  Level of Consciousness: awake, alert , and oriented  Airway & Oxygen Therapy: Patient Spontanous Breathing  Post-op Assessment: Report given to RN and Post -op Vital signs reviewed and stable  Post vital signs: Reviewed and stable  Last Vitals:  Vitals Value Taken Time  BP    Temp    Pulse    Resp    SpO2      Last Pain:  Vitals:   06/17/22 0657  TempSrc:   PainSc: Asleep      Patients Stated Pain Goal: 0 ([id number])  Complications: No notable events documented.

## 2022-06-17 NOTE — Progress Notes (Signed)
PROGRESS NOTE    Caleb [name]  [id number] DOB: 04/25/1950 DOA: 06/07/2022 PCP: [name], MD     Brief Narrative:   Caleb [name] is a 72 year old man with PVD, DM 2, HTN, AAS s/p TAVR, HFrEF with a EF of 35% send CKD 3 was admitted with recurrent diabetic foot infection and acute on chronic kidney injury.  Workup has revealed acute osteomyelitis in the posterolateral aspect of the calcaneus.  Patient is being treated with gentle IV hydration at 75 cc an hour and doxycycline/Augmentin pending surgery/bone biopsy per ID recommendations.  Patient is being seen by vascular surgery , Status post angiogram 3/25, plan to proceed with right femoral-tibial bypass graft today, patient remains at risk for limb loss even with successful revascularization.  New events last 24 hours / Subjective:  Reports could not sleep, pain is better controlled currently, otherwise denies any complaints, hopeful for successful surgery today.  Assessment & Plan:   Principal Problem:   Acute osteomyelitis of right calcaneus (HCC) Active Problems:   Type 2 diabetes mellitus with hyperlipidemia (HCC)   Acute kidney injury superimposed on chronic kidney disease (HCC)   HTN (hypertension)   Hyperlipidemia   Hyponatremia   Dyslipidemia   PAD (peripheral artery disease) (HCC)   S/P TAVR (transcatheter aortic valve replacement)   Decubitus ulcer of right heel   Diabetic foot ulcer ([place])   Gastroenteritis   Sepsis secondary to acute osteomyelitis right heel and PVD  -Sepsis POA  -Appreciate infectious disease -Antibiotics per ID, continue doxycycline, Augmentin -Aspirin, Plavix, lipitor  -Status post angiogram 3/25.  Vascular surgery input greatly appreciated, he remains at high risk still for limb close, plan to proceed with right femoral-tibial bypass graft today.   AKI on CKD stage IIIb -Baseline Cr 2-2.5 -Appreciate nephrology -Thought to be secondary to hypovolemia while on  Entresto -Continue to hold Entresto and Lasix -Nephrology signed off 3/22.  Follow-up with [name] later this month -Improving  Hyperkalemia -Resolved  Diabetes mellitus type 2 with hyperglycemia -Novolog mix BID, SSI.  Dose adjusted today    DVT prophylaxis:  heparin injection 5,000 Units Start: 06/07/22 2200 SCDs Start: 06/07/22 1348  Code Status: Full Family Communication: None at bedside  Disposition Plan:  Status is: Inpatient Remains inpatient appropriate because: Further vascular surgery planned, recommended for femoral bypass   Antimicrobials:  Anti-infectives (From admission, onward)    Start     Dose/Rate Route Frequency Ordered Stop   06/16/22 1800  doxycycline (VIBRA-TABS) tablet 100 mg        100 mg Oral 2 times daily after meals 06/16/22 1600     06/09/22 1245  doxycycline (VIBRA-TABS) tablet 100 mg  Status:  Discontinued        100 mg Oral Every 12 hours 06/09/22 1145 06/16/22 1600   06/09/22 1245  amoxicillin-clavulanate (AUGMENTIN) 500-125 MG per tablet 1 tablet        1 tablet Oral 2 times daily 06/09/22 1145     06/08/22 1600  cefTRIAXone (ROCEPHIN) 2 g in sodium chloride 0.9 % 100 mL IVPB  Status:  Discontinued        2 g 200 mL/hr over 30 Minutes Intravenous Every 24 hours 06/08/22 0852 06/09/22 1145   06/08/22 1200  DAPTOmycin (CUBICIN) 600 mg in sodium chloride 0.9 % IVPB  Status:  Discontinued        8 mg/kg  77.4 kg 124 mL/hr over 30 Minutes Intravenous Every 48 hours 06/08/22 1012 06/09/22 1145  06/07/22 1415  linezolid (ZYVOX) IVPB 600 mg  Status:  Discontinued        600 mg 300 mL/hr over 60 Minutes Intravenous Every 12 hours 06/07/22 1414 06/08/22 0852   06/07/22 1415  ceFEPIme (MAXIPIME) 2 g in sodium chloride 0.9 % 100 mL IVPB  Status:  Discontinued        2 g 200 mL/hr over 30 Minutes Intravenous Daily 06/07/22 1414 06/08/22 0852        Objective: Vitals:   06/16/22 1701 06/16/22 1720 06/16/22 2305 06/17/22 0430  BP: 129/88  (!) 125/90 119/69 126/83  Pulse: 87  82 80  Resp: 18  18 16   Temp: 98 F (36.7 C)  98.5 F (36.9 C) 97.9 F (36.6 C)  TempSrc: Oral  Oral Oral  SpO2: 100%  96% 97%  Weight:      Height:        Intake/Output Summary (Last 24 hours) at 06/17/2022 0629 Last data filed at 06/17/2022 0300 Gross per 24 hour  Intake 206.67 ml  Output --  Net 206.67 ml   Filed Weights   06/14/22 0629 06/15/22 0706 06/16/22 0424  Weight: 69.6 kg 84 kg 83.4 kg    Examination:   Awake Alert, Oriented X 3, No new F.N deficits, Normal affect Symmetrical Chest wall movement, Good air movement bilaterally, CTAB RRR,No Gallops,Rubs or new Murmurs, No Parasternal Heave +ve B.Sounds, Abd Soft, No tenderness, No rebound - guarding or rigidity. No Palpable distal pulses in right lower extremity, right heel is bandaged in Prevalon boot  Data Reviewed: I have personally reviewed following labs and imaging studies  CBC: Recent Labs  Lab 06/12/22 0127 06/13/22 0112 06/14/22 0119 06/16/22 0057 06/17/22 0048  WBC 13.7* 12.6* 10.6* 11.8* 10.9*  NEUTROABS  --   --   --  8.1*  --   HGB 12.3* 12.6* 11.7* 11.6* 11.2*  HCT 38.7* 39.0 37.1* 36.4* 34.2*  MCV 82.9 82.1 82.8 82.5 81.0  PLT 301 296 332 348 [id number]   Basic Metabolic Panel: Recent Labs  Lab 06/13/22 0112 06/13/22 0744 06/14/22 0119 06/15/22 0119 06/16/22 0057 06/17/22 0048  NA 130*  --  132* 133* 132* 133*  K 6.0* 4.6 4.1 4.4 4.0 3.9  CL 95*  --  99 99 96* 99  CO2 25  --  27 25 27 25   GLUCOSE 164*  --  123* 110* 124* 106*  BUN 36*  --  25* 21 20 15   CREATININE 1.68*  --  1.33* 1.19 1.35* 1.19  CALCIUM 8.8*  --  8.5* 8.5* 8.5* 8.2*   GFR: Estimated Creatinine Clearance: 57.9 mL/min (by C-G formula based on SCr of 1.19 mg/dL). Liver Function Tests: No results for input(s): "AST", "ALT", "ALKPHOS", "BILITOT", "PROT", "ALBUMIN" in the last 168 hours. No results for input(s): "LIPASE", "AMYLASE" in the last 168 hours. No results for input(s):  "AMMONIA" in the last 168 hours. Coagulation Profile: No results for input(s): "INR", "PROTIME" in the last 168 hours. Cardiac Enzymes: No results for input(s): "CKTOTAL", "CKMB", "CKMBINDEX", "TROPONINI" in the last 168 hours. BNP (last 3 results) No results for input(s): "PROBNP" in the last 8760 hours. HbA1C: No results for input(s): "HGBA1C" in the last 72 hours. CBG: Recent Labs  Lab 06/16/22 0635 06/16/22 1107 06/16/22 1703 06/16/22 2115 06/17/22 0616  GLUCAP 133* 143* 164* 142* 150*   Lipid Profile: No results for input(s): "CHOL", "HDL", "LDLCALC", "TRIG", "CHOLHDL", "LDLDIRECT" in the last 72 hours.  Thyroid Function Tests: No results  for input(s): "TSH", "T4TOTAL", "FREET4", "T3FREE", "THYROIDAB" in the last 72 hours. Anemia Panel: No results for input(s): "VITAMINB12", "FOLATE", "FERRITIN", "TIBC", "IRON", "RETICCTPCT" in the last 72 hours. Sepsis Labs: No results for input(s): "PROCALCITON", "LATICACIDVEN" in the last 168 hours.  Recent Results (from the past 240 hour(s))  Culture, blood (Routine X 2) w Reflex to ID Panel     Status: None   Collection Time: 06/08/22  8:28 AM   Specimen: BLOOD  Result Value Ref Range Status   Specimen Description   Final    BLOOD RIGHT ANTECUBITAL Performed at [facility] [address] [place], [place] [address]    Special Requests   Final    BOTTLES DRAWN AEROBIC ONLY Blood Culture adequate volume Performed at [facility] [address] [place], [place] [address]    Culture   Final    NO GROWTH 5 DAYS Performed at [facility], [place] [address] [place], [place] [address]    Report Status 06/13/2022 FINAL  Final  Culture, blood (Routine X 2) w Reflex to ID Panel     Status: None   Collection Time: 06/08/22  8:28 AM   Specimen: BLOOD RIGHT HAND  Result Value Ref Range Status   Specimen Description   Final    BLOOD RIGHT HAND Performed at [facility] [address], [place], [place] [address]    Special Requests   Final    BOTTLES DRAWN AEROBIC ONLY Blood Culture adequate volume Performed at [facility] [address] [place], [place] [address]    Culture   Final    NO GROWTH 5 DAYS Performed at [facility], [place] [address], [place], [place] [address]    Report Status 06/13/2022 FINAL  Final  Surgical pcr screen     Status: None   Collection Time: 06/16/22  9:23 PM   Specimen: Nasal Mucosa; Nasal Swab  Result Value Ref Range Status   MRSA, PCR NEGATIVE NEGATIVE Final   Staphylococcus aureus NEGATIVE NEGATIVE Final    Comment: (NOTE) The Xpert SA Assay (FDA approved for NASAL specimens in patients 30 years of age and older), is one component of a comprehensive surveillance program. It is not intended to diagnose infection nor to guide or monitor treatment. Performed at [facility], [place] [address], [place], [place] [address]       Radiology Studies: DG Abd 2 Views  Result Date: 06/15/2022 CLINICAL DATA:  Vomiting. EXAM: ABDOMEN - 2 VIEW COMPARISON:  None Available. FINDINGS: There is no significant bowel dilation to suggest obstruction. Multiple air-fluid levels are noted on the erect view, however. There is no free air. Soft tissues are poorly defined.  No gross soft tissue abnormality. IMPRESSION: 1. Multiple air-fluid levels on the erect view within nondistended bowel. Findings suspected to be a mild diffuse adynamic ileus or gastroenteritis. No convincing bowel obstruction. Electronically Signed   By: Lajean Manes M.D.   On: 06/15/2022 18:33      Scheduled Meds:  amoxicillin-clavulanate  1 tablet Oral BID   aspirin  81 mg Oral Daily   atorvastatin  40 mg Oral Daily   carvedilol  12.5 mg Oral BID WC   Chlorhexidine Gluconate Cloth  6 each Topical Daily   cholecalciferol  1,000 Units Oral Daily   cyanocobalamin  500 mcg Oral q AM   docusate sodium  100 mg Oral BID   doxycycline  100  mg  Oral BID PC   ferrous sulfate  325 mg Oral Q breakfast   furosemide  40 mg Oral q1600   furosemide  80 mg Oral q AM   heparin  5,000 Units Subcutaneous Q8H   hydrALAZINE  25 mg Oral TID   insulin aspart  0-15 Units Subcutaneous TID WC   insulin aspart  0-5 Units Subcutaneous QHS   insulin aspart protamine- aspart  9 Units Subcutaneous BID WC   isosorbide mononitrate  30 mg Oral Daily   sacubitril-valsartan  1 tablet Oral BID   sodium chloride flush  3 mL Intravenous Q12H   Continuous Infusions:  sodium chloride Stopped (06/13/22 1553)   sodium chloride       LOS: 10 days     Phillips Climes, MD [facility] 06/17/2022, 6:29 AM   Available via Epic secure chat 7am-7pm After these hours, please refer to coverage provider listed on [email or web address]

## 2022-06-17 NOTE — Progress Notes (Signed)
Pt arrived from ..pacu..., A/ox .4..pt denies any pain, MD aware,CCMD called. CHG bath given,no further needs at this time   

## 2022-06-17 NOTE — Op Note (Signed)
NAME: Caleb [name]    MRN: [id number] DOB: Nov 25, 1950    DATE OF OPERATION: 06/17/2022  PREOP DIAGNOSIS:    Right lower extremity critical limb ischemia with tissue loss of the heel  POSTOP DIAGNOSIS:    Same  PROCEDURE:    Right lower extremity common femoral artery exposure, anterior tibial artery exposure Right leg greater saphenous vein harvest, valve lysis Superficial femoral artery endarterectomy Superficial femoral artery to anterior tibial artery bypass using nonreversed greater saphenous vein  SURGEON: Broadus John  ASSIST: Well Trula Slade MD, Dagoberto Ligas   ANESTHESIA: General  EBL: 250 mL  INDICATIONS:    Caleb [name] is a 72 y.o. male with known peripheral arterial disease who recently underwent angiography demonstrating no endovascular salvage options for right lower extremity heel wound.  After discussing the risk and benefits of common femoral artery to anterior tibial artery bypass, Caleb [name] elected to proceed.  FINDINGS:   4.5 mm greater saphenous vein which tapered.  At its smallest point, the vein was 3 mm.  The distal 5 cm of the conduit were sclerotic  TECHNIQUE:   Patient was brought to the OR laid in supine position.  General anesthesia was induced the patient was prepped and draped in standard fashion.  The case began with ultrasound insonation of the right greater saphenous vein.  The vein appeared of adequate size with multiple branches occurring just inferior to the knee.  There was one branch that appeared sizable, and therefore I elected to move forward with greater saphenous vein harvest.  Dr. [name] helped to start the saphenous vein harvest while I exposed to the common femoral, and anterior tibial arteries.  The saphenous vein harvest occurred there are multiple skip incisions.  The common femoral artery was exposed through an oblique incision.  Circumflex iliac veins crossing over the distal external iliac artery were ligated.   There is a small arterial injury and that was primarily repaired at this level.  Both the profunda and superficial femoral artery were exposed.  The dissection was more challenging than anticipated due to multiple prior catheterizations.  The planes were more similar to a redo groin.  Once the distal external iliac artery, common femoral artery, profunda, superficial femoral artery were exposed, my attention turned to the anterior tibial artery.  The anterior tibial artery was exposed on the lateral aspect of the proximal calf.  The muscle bellies of the anterior tibialis were split with the artery easily identifiable underneath.  There was not a lot of atherosclerotic disease appreciated on palpation.  Once exposed, I moved to complete the greater saphenous vein harvest.  When exploring the greater saphenous vein at the area of branching just below the knee, there was 1 branch that appeared sizable, however it was sclerotic.  The vein was taken to the distal calf.  Proximal and distal aspects of the greater saphenous vein were oversewn with 2 silk suture.  Once vein was harvested, we moved to prepping and on the back table.  I initially thought the distal portion of the greater saphenous vein was in spasm, but on further assessment, the vein was very sclerotic.  I had difficulty even passing a 1.5 mm coronary dilator.  The most distal section was resected, however there was still a 5 cm section of sclerosis, however it appeared to be roughly 2.5 to 3 mm in diameter.  Family was brought to the field and tunneled from the anterior tibial artery to the common femoral  artery in the subcutaneous plane.  Being that the vein was foreshortened after resecting the area of sclerosis, I moved to endarterectomized the superficial femoral artery to shorten the bypass length.  The patient was heparinized with 9000 units IV heparin to an ACT greater than 250 for the remainder of the case.  The common femoral artery,  profunda, superficial femoral artery were clamped and a longitudinal arteriotomy made.  Endarterectomy followed including the ostia of the superficial femoral artery.  Next, the greater saphenous vein was brought to the field.  I lysed to the first valve under direct visualization valve, and sewed the vein, nonreversed, in end-to-side fashion using 6-0 running Prolene suture.  Next, hand-held valvulotome was brought onto the field and valve lysed.  Again, I was very concerned distally due to the amount of sclerosis and poor diameter of the vein.  Fortunately, upon valve lysis, there was an excellent pulse throughout the entirety of the bypass graft, with excellent flow at its distal aspect.  I elected to use the venous conduit.  The conduit was marked and special attention taken to ensure it did not kink well running through the tunneler.  Upon exit, there was excellent pulse of the bypass graft and flow when the clamp was removed.  Next, a tourniquet was placed and Esmarch used for venous drainage.  The tourniquet was then inflated.  4 cm arteriotomy was made on the anterior tibial artery.  Coronary dilators were passed and increasing size to 3.5 mm.  The bypass was sewn in end-to-side fashion using running 6-0 Prolene suture.  I placed a coronary dilator in the distal portion of the bypass to ensure that the diameter was at least 3 mm.  It was.  Upon completion, the tourniquet was removed and arteries backbled prior to antegrade flow from the bypass graft.  There was a palpable pulse in the bypass graft, and palpable pulse distally in the anterior tibial artery.  There was a multiphasic signal in the dorsalis pedis artery which is consistent with the angiogram which demonstrated some disease in the distal anterior tibial artery, dorsalis pedis.  Heparin was reversed with use of protamine.  Hemostasis achieved with use of thrombin product, cautery, suture.  All wounds were irrigated with copious amounts of  saline.  The saphenectomy site was closed using layers of running 2-0 Vicryl suture with Monocryl in the thigh and staples at the calf as there was significant edema preop, and I wanted to allow drainage to be necessary.  Floseal was placed in the right groin as well as the anterior tibial artery site, and the right groin was closed in layers of running 2-0 Vicryl suture with Monocryl and Dermabond at the level of the skin.  The anterior tibial artery site was closed with simple 2-0 Vicryl, and staples at the level of the skin. An Ace was placed for light compression.  At case completion, the patient had a multiphasic signal in the dorsalis pedis artery.  I plan to start the patient on 500 units of heparin per hour as I do believe the bypass graft is disadvantaged.  I chose this bypass graft over 6 mm ringed PTFE because I thought that it was still a better conduit than what PTFE would provide.    Macie Burows, MD [facility] DATE OF DICTATION:   06/17/2022

## 2022-06-17 NOTE — Progress Notes (Signed)
  Progress Note    06/17/2022 7:41 AM Day of Surgery  Subjective:  no complaints. Slept well   Vitals:   06/16/22 2305 06/17/22 0430  BP: 119/69 126/83  Pulse: 82 80  Resp: 18 16  Temp: 98.5 F (36.9 C) 97.9 F (36.6 C)  SpO2: 96% 97%   Physical Exam: Cardiac:  regular Lungs:  non labored Extremities:  No palpable distal pulses in right lower extremity . Right heel dressed. Prevalon boot on right foot Neurologic: alert and oriented  CBC    Component Value Date/Time   WBC 10.9 (H) 06/17/2022 0048   RBC 4.22 06/17/2022 0048   HGB 11.2 (L) 06/17/2022 0048   HGB 14.6 03/09/2022 1233   HGB 13.3 11/15/2019 1139   HCT 34.2 (L) 06/17/2022 0048   HCT 40.2 11/15/2019 1139   PLT 323 06/17/2022 0048   PLT 216 03/09/2022 1233   PLT 300 11/15/2019 1139   MCV 81.0 06/17/2022 0048   MCV 86 11/15/2019 1139   MCH 26.5 06/17/2022 0048   MCHC 32.7 06/17/2022 0048   RDW 16.0 (H) 06/17/2022 0048   RDW 14.5 11/15/2019 1139   LYMPHSABS 1.7 06/16/2022 0057   LYMPHSABS 3.3 (H) 09/28/2017 1113   MONOABS 1.3 (H) 06/16/2022 0057   EOSABS 0.6 (H) 06/16/2022 0057   EOSABS 0.3 09/28/2017 1113   BASOSABS 0.0 06/16/2022 0057   BASOSABS 0.0 09/28/2017 1113    BMET    Component Value Date/Time   NA 133 (L) 06/17/2022 0048   NA 142 02/16/2022 1544   K 3.9 06/17/2022 0048   CL 99 06/17/2022 0048   CO2 25 06/17/2022 0048   GLUCOSE 106 (H) 06/17/2022 0048   BUN 15 06/17/2022 0048   BUN 22 02/16/2022 1544   CREATININE 1.19 06/17/2022 0048   CREATININE 1.36 (H) 03/09/2022 1233   CREATININE 0.86 06/06/2016 1455   CALCIUM 8.2 (L) 06/17/2022 0048   GFRNONAA >60 06/17/2022 0048   GFRNONAA 56 (L) 03/09/2022 1233   GFRNONAA 87 04/02/2013 1632   GFRAA >60 12/17/2019 0354   GFRAA >89 04/02/2013 1632    INR    Component Value Date/Time   INR 1.0 01/11/2022 0957     Intake/Output Summary (Last 24 hours) at 06/17/2022 0741 Last data filed at 06/17/2022 0300 Gross per 24 hour  Intake  206.67 ml  Output --  Net 206.67 ml      Assessment/Plan:  72 y.o. male is s/p CO2 Aortogram, and iliac arteriogram with RLE runoff  Day of Surgery   No endovascular revasc options for RLE Remains at high risk still for limb loss Plan right fem-AT bypass with vein - pt comfortable proceeding     Broadus John,  [facility] [phone] 06/17/2022 7:41 AM

## 2022-06-17 NOTE — Anesthesia Procedure Notes (Signed)
Procedure Name: Intubation Date/Time: 06/17/2022 8:10 AM  Performed by: Gwyndolyn Saxon, CRNAPre-anesthesia Checklist: Patient identified, Emergency Drugs available, Suction available and Patient being monitored Patient Re-evaluated:Patient Re-evaluated prior to induction Oxygen Delivery Method: Circle system utilized Preoxygenation: Pre-oxygenation with 100% oxygen Induction Type: IV induction Ventilation: Mask ventilation without difficulty Laryngoscope Size: Mac and 4 Grade View: Grade I Tube type: Oral Tube size: 7.5 mm Number of attempts: 1 Airway Equipment and Method: Patient positioned with wedge pillow and Stylet Placement Confirmation: ETT inserted through vocal cords under direct vision, positive ETCO2 and breath sounds checked- equal and bilateral Secured at: 24 cm Tube secured with: Tape Dental Injury: Teeth and Oropharynx as per pre-operative assessment

## 2022-06-18 ENCOUNTER — Encounter (HOSPITAL_COMMUNITY): Payer: Self-pay | Admitting: Vascular Surgery

## 2022-06-18 DIAGNOSIS — M86171 Other acute osteomyelitis, right ankle and foot: Secondary | ICD-10-CM | POA: Diagnosis not present

## 2022-06-18 LAB — BASIC METABOLIC PANEL
Anion gap: 9 (ref 5–15)
BUN: 19 mg/dL (ref 8–23)
CO2: 24 mmol/L (ref 22–32)
Calcium: 8.2 mg/dL — ABNORMAL LOW (ref 8.9–10.3)
Chloride: 98 mmol/L (ref 98–111)
Creatinine, Ser: 1.36 mg/dL — ABNORMAL HIGH (ref 0.61–1.24)
GFR, Estimated: 55 mL/min — ABNORMAL LOW (ref >60)
Glucose, Bld: 148 mg/dL — ABNORMAL HIGH (ref 70–99)
Potassium: 4.6 mmol/L (ref 3.5–5.1)
Sodium: 131 mmol/L — ABNORMAL LOW (ref 135–145)

## 2022-06-18 LAB — GLUCOSE, CAPILLARY
Glucose-Capillary: 156 mg/dL — ABNORMAL HIGH (ref 70–99)
Glucose-Capillary: 163 mg/dL — ABNORMAL HIGH (ref 70–99)
Glucose-Capillary: 101 mg/dL — ABNORMAL HIGH (ref 70–99)
Glucose-Capillary: 121 mg/dL — ABNORMAL HIGH (ref 70–99)

## 2022-06-18 LAB — CBC
HCT: 32.6 % — ABNORMAL LOW (ref 39.0–52.0)
Hemoglobin: 10.9 g/dL — ABNORMAL LOW (ref 13.0–17.0)
MCH: 27.1 pg (ref 26.0–34.0)
MCHC: 33.4 g/dL (ref 30.0–36.0)
MCV: 81.1 fL (ref 80.0–100.0)
Platelets: 316 10*3/uL (ref 150–400)
RBC: 4.02 MIL/uL — ABNORMAL LOW (ref 4.22–5.81)
RDW: 16.2 % — ABNORMAL HIGH (ref 11.5–15.5)
WBC: 12.3 10*3/uL — ABNORMAL HIGH (ref 4.0–10.5)
nRBC: 0 % (ref 0.0–0.2)

## 2022-06-18 LAB — HEPARIN LEVEL (UNFRACTIONATED): Heparin Unfractionated: <0.1 IU/mL — ABNORMAL LOW (ref 0.30–0.70)

## 2022-06-18 NOTE — Progress Notes (Addendum)
PROGRESS NOTE    Caleb [name]  [id number] DOB: 1950-05-19 DOA: 06/07/2022 PCP: [name], MD     Brief Narrative:   Caleb [name] is a 72 year old man with PVD, DM 2, HTN, AAS s/p TAVR, HFrEF with a EF of 35% send CKD 3 was admitted with recurrent diabetic foot infection and acute on chronic kidney injury.  Workup has revealed acute osteomyelitis in the posterolateral aspect of the calcaneus.  Patient is being treated with gentle IV hydration at 75 cc an hour and doxycycline/Augmentin pending surgery/bone biopsy per ID recommendations.  Patient is being seen by vascular surgery , Status post angiogram 3/25, with right femoral-tibial bypass graft. Did well postop.  New events last 24 hours / Subjective: This morning patient states his pain is old on his current regimen of Tylenol, hydromorphone and hydrocodone.  He is in good spirits.  Labs overnight were obtained which showed WBC 12.3 from 10.9, hemoglobin stable at 10.9, creatinine 1.36 at baseline.  Vascular surgery saw him today and recommend Ace wrap's with dressing change tomorrow and continuing heparin and antibiotics.   Assessment & Plan:   Principal Problem:   Acute osteomyelitis of right calcaneus (HCC) Active Problems:   Type 2 diabetes mellitus with hyperlipidemia (HCC)   Acute kidney injury superimposed on chronic kidney disease (HCC)   HTN (hypertension)   Hyperlipidemia   Hyponatremia   Dyslipidemia   PAD (peripheral artery disease) (HCC)   S/P TAVR (transcatheter aortic valve replacement)   Decubitus ulcer of right heel   Diabetic foot ulcer ([place])   Gastroenteritis   Sepsis secondary to acute osteomyelitis right heel and PVD  -Sepsis POA  -Appreciate infectious disease -Antibiotics per ID, continue doxycycline, Augmentin -Aspirin, Plavix, lipitor  -Status post angiogram 3/25.  Status post femoral-tibial bypass   AKI on CKD stage IIIb -Baseline Cr 2-2.5 -Appreciate nephrology -Thought to be  secondary to hypovolemia while on Entresto -Continue to hold Entresto and Lasix -Nephrology signed off 3/22.  Follow-up with [name] later this month -Improving  Hyperkalemia -Resolved  Diabetes mellitus type 2 with hyperglycemia -Novolog mix BID, SSI.     DVT prophylaxis:  SCD's Start: 06/17/22 1526 SCDs Start: 06/07/22 1348  Code Status: Full Family Communication: None at bedside  Disposition Plan:  Status is: Inpatient Remains inpatient appropriate because: Further vascular surgery planned, recommended for femoral bypass   Antimicrobials:  Anti-infectives (From admission, onward)    Start     Dose/Rate Route Frequency Ordered Stop   06/17/22 0740  ceFAZolin (ANCEF) 2-4 GM/100ML-% IVPB       Note to Pharmacy: Bobbie Stack M: cabinet override      06/17/22 0740 06/17/22 1944   06/16/22 1800  doxycycline (VIBRA-TABS) tablet 100 mg        100 mg Oral 2 times daily after meals 06/16/22 1600     06/09/22 1245  doxycycline (VIBRA-TABS) tablet 100 mg  Status:  Discontinued        100 mg Oral Every 12 hours 06/09/22 1145 06/16/22 1600   06/09/22 1245  amoxicillin-clavulanate (AUGMENTIN) 500-125 MG per tablet 1 tablet        1 tablet Oral 2 times daily 06/09/22 1145     06/08/22 1600  cefTRIAXone (ROCEPHIN) 2 g in sodium chloride 0.9 % 100 mL IVPB  Status:  Discontinued        2 g 200 mL/hr over 30 Minutes Intravenous Every 24 hours 06/08/22 0852 06/09/22 1145   06/08/22 1200  DAPTOmycin (CUBICIN)  600 mg in sodium chloride 0.9 % IVPB  Status:  Discontinued        8 mg/kg  77.4 kg 124 mL/hr over 30 Minutes Intravenous Every 48 hours 06/08/22 1012 06/09/22 1145   06/07/22 1415  linezolid (ZYVOX) IVPB 600 mg  Status:  Discontinued        600 mg 300 mL/hr over 60 Minutes Intravenous Every 12 hours 06/07/22 1414 06/08/22 0852   06/07/22 1415  ceFEPIme (MAXIPIME) 2 g in sodium chloride 0.9 % 100 mL IVPB  Status:  Discontinued        2 g 200 mL/hr over 30 Minutes Intravenous  Daily 06/07/22 1414 06/08/22 0852        Objective: Vitals:   06/17/22 1933 06/17/22 2310 06/18/22 0318 06/18/22 0803  BP: 132/83 119/87 123/84 107/84  Pulse: 89 82 85 96  Resp: 15 17 14 19   Temp: 98.1 F (36.7 C) 98.1 F (36.7 C) 98 F (36.7 C) 98.1 F (36.7 C)  TempSrc: Oral Oral Oral Oral  SpO2: 94% 95% 99% 97%  Weight:      Height:        Intake/Output Summary (Last 24 hours) at 06/18/2022 0847 Last data filed at 06/18/2022 0418 Gross per 24 hour  Intake 2440.23 ml  Output 1375 ml  Net 1065.23 ml    Filed Weights   06/15/22 0706 06/16/22 0424 06/17/22 0500  Weight: 84 kg 83.4 kg 83.1 kg    Examination:   Awake Alert, Oriented X 3, No new F.N deficits, Normal affect Symmetrical Chest wall movement, Good air movement bilaterally, CTAB RRR,No Gallops,Rubs or new Murmurs, No Parasternal Heave +ve B.Sounds, Abd Soft, No tenderness, No rebound - guarding or rigidity. No Palpable distal pulses in right lower extremity, right heel is bandaged in Prevalon boot  Data Reviewed: I have personally reviewed following labs and imaging studies  CBC: Recent Labs  Lab 06/13/22 0112 06/14/22 0119 06/16/22 0057 06/17/22 0048 06/18/22 0313  WBC 12.6* 10.6* 11.8* 10.9* 12.3*  NEUTROABS  --   --  8.1*  --   --   HGB 12.6* 11.7* 11.6* 11.2* 10.9*  HCT 39.0 37.1* 36.4* 34.2* 32.6*  MCV 82.1 82.8 82.5 81.0 81.1  PLT 296 332 348 323 [id number]    Basic Metabolic Panel: Recent Labs  Lab 06/14/22 0119 06/15/22 0119 06/16/22 0057 06/17/22 0048 06/18/22 0313  NA 132* 133* 132* 133* 131*  K 4.1 4.4 4.0 3.9 4.6  CL 99 99 96* 99 98  CO2 27 25 27 25 24   GLUCOSE 123* 110* 124* 106* 148*  BUN 25* 21 20 15 19   CREATININE 1.33* 1.19 1.35* 1.19 1.36*  CALCIUM 8.5* 8.5* 8.5* 8.2* 8.2*    GFR: Estimated Creatinine Clearance: 50.6 mL/min (A) (by C-G formula based on SCr of 1.36 mg/dL (H)). Liver Function Tests: No results for input(s): "AST", "ALT", "ALKPHOS", "BILITOT", "PROT",  "ALBUMIN" in the last 168 hours. No results for input(s): "LIPASE", "AMYLASE" in the last 168 hours. No results for input(s): "AMMONIA" in the last 168 hours. Coagulation Profile: No results for input(s): "INR", "PROTIME" in the last 168 hours. Cardiac Enzymes: No results for input(s): "CKTOTAL", "CKMB", "CKMBINDEX", "TROPONINI" in the last 168 hours. BNP (last 3 results) No results for input(s): "PROBNP" in the last 8760 hours. HbA1C: No results for input(s): "HGBA1C" in the last 72 hours. CBG: Recent Labs  Lab 06/17/22 1341 06/17/22 1428 06/17/22 1703 06/17/22 2113 06/18/22 0555  GLUCAP 94 83 142* 158* 156*  Lipid Profile: No results for input(s): "CHOL", "HDL", "LDLCALC", "TRIG", "CHOLHDL", "LDLDIRECT" in the last 72 hours.  Thyroid Function Tests: No results for input(s): "TSH", "T4TOTAL", "FREET4", "T3FREE", "THYROIDAB" in the last 72 hours. Anemia Panel: No results for input(s): "VITAMINB12", "FOLATE", "FERRITIN", "TIBC", "IRON", "RETICCTPCT" in the last 72 hours. Sepsis Labs: No results for input(s): "PROCALCITON", "LATICACIDVEN" in the last 168 hours.  Recent Results (from the past 240 hour(s))  Surgical pcr screen     Status: None   Collection Time: 06/16/22  9:23 PM   Specimen: Nasal Mucosa; Nasal Swab  Result Value Ref Range Status   MRSA, PCR NEGATIVE NEGATIVE Final   Staphylococcus aureus NEGATIVE NEGATIVE Final    Comment: (NOTE) The Xpert SA Assay (FDA approved for NASAL specimens in patients 64 years of age and older), is one component of a comprehensive surveillance program. It is not intended to diagnose infection nor to guide or monitor treatment. Performed at [facility], [place] [address], [place], [place] [address]       Radiology Studies: HYBRID OR IMAGING ([place])  Result Date: 06/17/2022 There is no interpretation for this exam.  This order is for images obtained during a surgical procedure.  Please See "Surgeries" Tab for more  information regarding the procedure.      Scheduled Meds:  amoxicillin-clavulanate  1 tablet Oral BID   aspirin  81 mg Oral Daily   atorvastatin  40 mg Oral Daily   carvedilol  12.5 mg Oral BID WC   Chlorhexidine Gluconate Cloth  6 each Topical Daily   cholecalciferol  1,000 Units Oral Daily   cyanocobalamin  500 mcg Oral q AM   docusate sodium  100 mg Oral BID   doxycycline  100 mg Oral BID PC   ferrous sulfate  325 mg Oral Q breakfast   hydrALAZINE  25 mg Oral TID   insulin aspart  0-15 Units Subcutaneous TID WC   insulin aspart  0-5 Units Subcutaneous QHS   insulin aspart protamine- aspart  9 Units Subcutaneous BID WC   isosorbide mononitrate  30 mg Oral Daily   pantoprazole  40 mg Oral Daily   sacubitril-valsartan  1 tablet Oral BID   Continuous Infusions:  sodium chloride Stopped (06/13/22 1553)   sodium chloride     heparin 500 Units/hr (06/18/22 0418)   magnesium sulfate bolus IVPB       LOS: 11 days    Time spent 45 min Emilee Hero, MD [facility] 06/18/2022, 8:47 AM   Available via Epic secure chat 7am-7pm After these hours, please refer to coverage provider listed on [email or web address]

## 2022-06-18 NOTE — Progress Notes (Signed)
Mobility Specialist Progress Note    06/18/22 1306  Mobility  Activity Ambulated with assistance to bathroom  Level of Assistance Moderate assist, patient does 50-74%  Assistive Device Front wheel walker  Distance Ambulated (ft) 12 ft  Activity Response Tolerated well  Mobility Referral Yes  $Mobility charge 1 Mobility   Post-Mobility: 97 HR  Pt received in bed and agreeable. Pt modA for bed mobility and minA for ambulation w/ verbal cueing for safety. Left in BR and encouraged to pull string when ready. NT aware.  Hildred Alamin Mobility Specialist  Please Psychologist, sport and exercise or Rehab Office at [phone]

## 2022-06-18 NOTE — Progress Notes (Signed)
Mobility Specialist Progress Note    06/18/22 1159  Mobility  Activity Refused mobility   Pt stated he did not want to WB through RLE. Offered to maintain NWB RLE w/ a pivot to the chair. Pt stated he knows it's Easter weekend and knows how to take time off. Pt then stated he does not want to be in pain. Will f/u as schedule permits.    Hildred Alamin Mobility Specialist  Please Psychologist, sport and exercise or Rehab Office at [phone]

## 2022-06-18 NOTE — Progress Notes (Addendum)
Progress Note    06/18/2022 10:02 AM 1 Day Post-Op  Subjective: Right foot feels better after surgery   Vitals:   06/18/22 0318 06/18/22 0803  BP: 123/84 107/84  Pulse: 85 96  Resp: 14 19  Temp: 98 F (36.7 C) 98.1 F (36.7 C)  SpO2: 99% 97%   Physical Exam: Lungs: Nonlabored Incisions: Right groin incision clean dry and intact; dressing left in place right leg Extremities: Palpable bypass pulse at the level of the knee; brisk DP signal by Doppler Neurologic: A&O  CBC    Component Value Date/Time   WBC 12.3 (H) 06/18/2022 0313   RBC 4.02 (L) 06/18/2022 0313   HGB 10.9 (L) 06/18/2022 0313   HGB 14.6 03/09/2022 1233   HGB 13.3 11/15/2019 1139   HCT 32.6 (L) 06/18/2022 0313   HCT 40.2 11/15/2019 1139   PLT 316 06/18/2022 0313   PLT 216 03/09/2022 1233   PLT 300 11/15/2019 1139   MCV 81.1 06/18/2022 0313   MCV 86 11/15/2019 1139   MCH 27.1 06/18/2022 0313   MCHC 33.4 06/18/2022 0313   RDW 16.2 (H) 06/18/2022 0313   RDW 14.5 11/15/2019 1139   LYMPHSABS 1.7 06/16/2022 0057   LYMPHSABS 3.3 (H) 09/28/2017 1113   MONOABS 1.3 (H) 06/16/2022 0057   EOSABS 0.6 (H) 06/16/2022 0057   EOSABS 0.3 09/28/2017 1113   BASOSABS 0.0 06/16/2022 0057   BASOSABS 0.0 09/28/2017 1113    BMET    Component Value Date/Time   NA 131 (L) 06/18/2022 0313   NA 142 02/16/2022 1544   K 4.6 06/18/2022 0313   CL 98 06/18/2022 0313   CO2 24 06/18/2022 0313   GLUCOSE 148 (H) 06/18/2022 0313   BUN 19 06/18/2022 0313   BUN 22 02/16/2022 1544   CREATININE 1.36 (H) 06/18/2022 0313   CREATININE 1.36 (H) 03/09/2022 1233   CREATININE 0.86 06/06/2016 1455   CALCIUM 8.2 (L) 06/18/2022 0313   GFRNONAA 55 (L) 06/18/2022 0313   GFRNONAA 56 (L) 03/09/2022 1233   GFRNONAA 87 04/02/2013 1632   GFRAA >60 12/17/2019 0354   GFRAA >89 04/02/2013 1632    INR    Component Value Date/Time   INR 1.0 01/11/2022 0957     Intake/Output Summary (Last 24 hours) at 06/18/2022 1002 Last data filed at  06/18/2022 0800 Gross per 24 hour  Intake 1608.73 ml  Output 1050 ml  Net 558.73 ml     Assessment/Plan:  72 y.o. male is s/p right common femoral artery endarterectomy with femoral to anterior tibial artery bypass with vein 1 Day Post-Op   Right foot warm and well-perfused with palpable bypass pulse and brisk DP signal by Doppler Continue Ace wrap today; will change dressing tomorrow Okay to get out of bed Encouraged patient to float the heel when in bed educated him to keep a pillow under his calf Continue heparin for now Continue antibiotic regimen   Dagoberto Ligas, PA-C [facility] [phone] 06/18/2022 10:02 AM  I agree with the above.  I have seen and evaluated the patient.  He is 1 day out from a right femoral to anterior tibial bypass graft with vein.  He has a palpable graft pulse.  He is maintained on IV heparin at a continuous rate, not being adjusted.  He can get out of bed and ambulate.  He is nonweightbearing on his right heel.  [name] will evaluate him next week for intervention for his right heel wound.  [name]

## 2022-06-19 DIAGNOSIS — M86171 Other acute osteomyelitis, right ankle and foot: Secondary | ICD-10-CM | POA: Diagnosis not present

## 2022-06-19 LAB — HEPARIN LEVEL (UNFRACTIONATED): Heparin Unfractionated: <0.1 IU/mL — ABNORMAL LOW (ref 0.30–0.70)

## 2022-06-19 LAB — GLUCOSE, CAPILLARY
Glucose-Capillary: 134 mg/dL — ABNORMAL HIGH (ref 70–99)
Glucose-Capillary: 131 mg/dL — ABNORMAL HIGH (ref 70–99)
Glucose-Capillary: 81 mg/dL (ref 70–99)
Glucose-Capillary: 154 mg/dL — ABNORMAL HIGH (ref 70–99)

## 2022-06-19 NOTE — Progress Notes (Addendum)
  Progress Note    06/19/2022 8:58 AM 2 Days Post-Op  Subjective:  no complaints   Vitals:   06/19/22 0600 06/19/22 0812  BP:  (!) 130/91  Pulse: 89   Resp: 18   Temp:  98.3 F (36.8 C)  SpO2: 94%    Physical Exam: Lungs:  non labored Incisions:  all incisions of the R leg are c/d/i Extremities:  palpable bypass pulse; brisk DP by doppler onto the distal foot; R heel necrotic but dry Neurologic: A&O  CBC    Component Value Date/Time   WBC 12.3 (H) 06/18/2022 0313   RBC 4.02 (L) 06/18/2022 0313   HGB 10.9 (L) 06/18/2022 0313   HGB 14.6 03/09/2022 1233   HGB 13.3 11/15/2019 1139   HCT 32.6 (L) 06/18/2022 0313   HCT 40.2 11/15/2019 1139   PLT 316 06/18/2022 0313   PLT 216 03/09/2022 1233   PLT 300 11/15/2019 1139   MCV 81.1 06/18/2022 0313   MCV 86 11/15/2019 1139   MCH 27.1 06/18/2022 0313   MCHC 33.4 06/18/2022 0313   RDW 16.2 (H) 06/18/2022 0313   RDW 14.5 11/15/2019 1139   LYMPHSABS 1.7 06/16/2022 0057   LYMPHSABS 3.3 (H) 09/28/2017 1113   MONOABS 1.3 (H) 06/16/2022 0057   EOSABS 0.6 (H) 06/16/2022 0057   EOSABS 0.3 09/28/2017 1113   BASOSABS 0.0 06/16/2022 0057   BASOSABS 0.0 09/28/2017 1113    BMET    Component Value Date/Time   NA 131 (L) 06/18/2022 0313   NA 142 02/16/2022 1544   K 4.6 06/18/2022 0313   CL 98 06/18/2022 0313   CO2 24 06/18/2022 0313   GLUCOSE 148 (H) 06/18/2022 0313   BUN 19 06/18/2022 0313   BUN 22 02/16/2022 1544   CREATININE 1.36 (H) 06/18/2022 0313   CREATININE 1.36 (H) 03/09/2022 1233   CREATININE 0.86 06/06/2016 1455   CALCIUM 8.2 (L) 06/18/2022 0313   GFRNONAA 55 (L) 06/18/2022 0313   GFRNONAA 56 (L) 03/09/2022 1233   GFRNONAA 87 04/02/2013 1632   GFRAA >60 12/17/2019 0354   GFRAA >89 04/02/2013 1632    INR    Component Value Date/Time   INR 1.0 01/11/2022 0957     Intake/Output Summary (Last 24 hours) at 06/19/2022 0858 Last data filed at 06/19/2022 0600 Gross per 24 hour  Intake 470 ml  Output 790 ml   Net -320 ml     Assessment/Plan:  72 y.o. male is s/p R CFA endarterectomy with femoral to ATA bypass with vein 2 Days Post-Op   R foot well perfused with brisk DP signal by doppler and palpable bypass pulse OOB; non weightbearing R heel Continue to offload R heel; wound appears dry Continue rate controlled heparin for now [name] to re-evaluate R heel wound this week   Dagoberto Ligas, PA-C [facility] [phone] 06/19/2022 8:58 AM  I agree with the above.  Have seen and evaluated the patient.  He has a palpable graft pulse.  He is nonweightbearing on the right heel but can ambulate on the ball of his foot.  He has been optimized from a vascular standpoint.  [name] will reevaluate his right heel this week  [name]

## 2022-06-19 NOTE — Progress Notes (Signed)
PROGRESS NOTE    Caleb [name]  [id number] DOB: 04-01-1950 DOA: 06/07/2022 PCP: [name], MD     Brief Narrative:   Caleb [name] is a 72 year old man with PVD, DM 2, HTN, AAS s/p TAVR, HFrEF with a EF of 35% send CKD 3 was admitted with recurrent diabetic foot infection and acute on chronic kidney injury.  Workup has revealed acute osteomyelitis in the posterolateral aspect of the calcaneus.  Patient is being treated with gentle IV hydration at 75 cc an hour and doxycycline/Augmentin pending surgery/bone biopsy per ID recommendations.  Patient is being seen by vascular surgery , Status post angiogram 3/25, with right femoral-tibial bypass graft. Did well postop.  New events last 24 hours / Subjective: This morning patient states his pain controlled on his current regimen of Tylenol, hydromorphone and hydrocodone.  He is in good spirits. Vascular surgery saw him today and pleased with postop progress. Plan to continue hep gtt for now.    Assessment & Plan:   Principal Problem:   Acute osteomyelitis of right calcaneus (HCC) Active Problems:   Type 2 diabetes mellitus with hyperlipidemia (HCC)   Acute kidney injury superimposed on chronic kidney disease (HCC)   HTN (hypertension)   Hyperlipidemia   Hyponatremia   Dyslipidemia   PAD (peripheral artery disease) (HCC)   S/P TAVR (transcatheter aortic valve replacement)   Decubitus ulcer of right heel   Diabetic foot ulcer ([place])   Gastroenteritis   Sepsis secondary to acute osteomyelitis right heel and PVD  -Sepsis POA  -Appreciate infectious disease -Antibiotics per ID, continue doxycycline, Augmentin -Aspirin, Plavix, lipitor  -Status post angiogram 3/25.  Status post femoral-tibial bypass   AKI on CKD stage IIIb, resolved -Baseline Cr 2-2.5 -Appreciate nephrology -Thought to be secondary to hypovolemia while on Entresto -Continue to hold Entresto and Lasix -Nephrology signed off 3/22.  Follow-up with Dr.  Joelyn Oms later this month -Improving  Hyperkalemia -Resolved  Diabetes mellitus type 2 with hyperglycemia -Novolog mix BID, SSI.     DVT prophylaxis:  SCD's Start: 06/17/22 1526 SCDs Start: 06/07/22 1348  Code Status: Full Family Communication: None at bedside  Disposition Plan:  Status is: Inpatient Remains inpatient appropriate because: Further vascular surgery planned, recommended for femoral bypass   Antimicrobials:  Anti-infectives (From admission, onward)    Start     Dose/Rate Route Frequency Ordered Stop   06/17/22 0740  ceFAZolin (ANCEF) 2-4 GM/100ML-% IVPB       Note to Pharmacy: Bobbie Stack M: cabinet override      06/17/22 0740 06/17/22 1944   06/16/22 1800  doxycycline (VIBRA-TABS) tablet 100 mg        100 mg Oral 2 times daily after meals 06/16/22 1600     06/09/22 1245  doxycycline (VIBRA-TABS) tablet 100 mg  Status:  Discontinued        100 mg Oral Every 12 hours 06/09/22 1145 06/16/22 1600   06/09/22 1245  amoxicillin-clavulanate (AUGMENTIN) 500-125 MG per tablet 1 tablet        1 tablet Oral 2 times daily 06/09/22 1145     06/08/22 1600  cefTRIAXone (ROCEPHIN) 2 g in sodium chloride 0.9 % 100 mL IVPB  Status:  Discontinued        2 g 200 mL/hr over 30 Minutes Intravenous Every 24 hours 06/08/22 0852 06/09/22 1145   06/08/22 1200  DAPTOmycin (CUBICIN) 600 mg in sodium chloride 0.9 % IVPB  Status:  Discontinued        8  mg/kg  77.4 kg 124 mL/hr over 30 Minutes Intravenous Every 48 hours 06/08/22 1012 06/09/22 1145   06/07/22 1415  linezolid (ZYVOX) IVPB 600 mg  Status:  Discontinued        600 mg 300 mL/hr over 60 Minutes Intravenous Every 12 hours 06/07/22 1414 06/08/22 0852   06/07/22 1415  ceFEPIme (MAXIPIME) 2 g in sodium chloride 0.9 % 100 mL IVPB  Status:  Discontinued        2 g 200 mL/hr over 30 Minutes Intravenous Daily 06/07/22 1414 06/08/22 0852        Objective: Vitals:   06/19/22 0500 06/19/22 0600 06/19/22 0615 06/19/22 0812  BP:     (!) 130/91  Pulse: 86 89    Resp: 18 18    Temp:    98.3 F (36.8 C)  TempSrc:    Oral  SpO2: 97% 94%    Weight:   88.3 kg   Height:        Intake/Output Summary (Last 24 hours) at 06/19/2022 1152 Last data filed at 06/19/2022 0900 Gross per 24 hour  Intake 350 ml  Output 790 ml  Net -440 ml    Filed Weights   06/16/22 0424 06/17/22 0500 06/19/22 0615  Weight: 83.4 kg 83.1 kg 88.3 kg    Examination:   Physical Exam Constitutional:      Appearance: He is normal weight.  HENT:     Head: Normocephalic.     Nose: Nose normal.     Mouth/Throat:     Mouth: Mucous membranes are moist.  Eyes:     Pupils: Pupils are equal, round, and reactive to light.  Cardiovascular:     Rate and Rhythm: Normal rate and regular rhythm.     Pulses: Normal pulses.     Heart sounds: Normal heart sounds.  Pulmonary:     Effort: Pulmonary effort is normal.     Breath sounds: Normal breath sounds.  Abdominal:     General: Abdomen is flat.  Musculoskeletal:     Cervical back: Normal range of motion.  Skin:    General: Skin is warm.  Neurological:     General: No focal deficit present.     Mental Status: He is alert. Mental status is at baseline.      Data Reviewed: I have personally reviewed following labs and imaging studies  CBC: Recent Labs  Lab 06/13/22 0112 06/14/22 0119 06/16/22 0057 06/17/22 0048 06/18/22 0313  WBC 12.6* 10.6* 11.8* 10.9* 12.3*  NEUTROABS  --   --  8.1*  --   --   HGB 12.6* 11.7* 11.6* 11.2* 10.9*  HCT 39.0 37.1* 36.4* 34.2* 32.6*  MCV 82.1 82.8 82.5 81.0 81.1  PLT 296 332 348 323 [id number]    Basic Metabolic Panel: Recent Labs  Lab 06/14/22 0119 06/15/22 0119 06/16/22 0057 06/17/22 0048 06/18/22 0313  NA 132* 133* 132* 133* 131*  K 4.1 4.4 4.0 3.9 4.6  CL 99 99 96* 99 98  CO2 27 25 27 25 24   GLUCOSE 123* 110* 124* 106* 148*  BUN 25* 21 20 15 19   CREATININE 1.33* 1.19 1.35* 1.19 1.36*  CALCIUM 8.5* 8.5* 8.5* 8.2* 8.2*    GFR: Estimated  Creatinine Clearance: 52.1 mL/min (A) (by C-G formula based on SCr of 1.36 mg/dL (H)). Liver Function Tests: No results for input(s): "AST", "ALT", "ALKPHOS", "BILITOT", "PROT", "ALBUMIN" in the last 168 hours. No results for input(s): "LIPASE", "AMYLASE" in the last 168 hours. No results  for input(s): "AMMONIA" in the last 168 hours. Coagulation Profile: No results for input(s): "INR", "PROTIME" in the last 168 hours. Cardiac Enzymes: No results for input(s): "CKTOTAL", "CKMB", "CKMBINDEX", "TROPONINI" in the last 168 hours. BNP (last 3 results) No results for input(s): "PROBNP" in the last 8760 hours. HbA1C: No results for input(s): "HGBA1C" in the last 72 hours. CBG: Recent Labs  Lab 06/18/22 0555 06/18/22 1200 06/18/22 1524 06/18/22 2111 06/19/22 0614  GLUCAP 156* 163* 101* 121* 134*    Lipid Profile: No results for input(s): "CHOL", "HDL", "LDLCALC", "TRIG", "CHOLHDL", "LDLDIRECT" in the last 72 hours.  Thyroid Function Tests: No results for input(s): "TSH", "T4TOTAL", "FREET4", "T3FREE", "THYROIDAB" in the last 72 hours. Anemia Panel: No results for input(s): "VITAMINB12", "FOLATE", "FERRITIN", "TIBC", "IRON", "RETICCTPCT" in the last 72 hours. Sepsis Labs: No results for input(s): "PROCALCITON", "LATICACIDVEN" in the last 168 hours.  Recent Results (from the past 240 hour(s))  Surgical pcr screen     Status: None   Collection Time: 06/16/22  9:23 PM   Specimen: Nasal Mucosa; Nasal Swab  Result Value Ref Range Status   MRSA, PCR NEGATIVE NEGATIVE Final   Staphylococcus aureus NEGATIVE NEGATIVE Final    Comment: (NOTE) The Xpert SA Assay (FDA approved for NASAL specimens in patients 41 years of age and older), is one component of a comprehensive surveillance program. It is not intended to diagnose infection nor to guide or monitor treatment. Performed at [facility], [place] [address], [place], [place] [address]       Radiology Studies: HYBRID OR IMAGING  (Terminous)  Result Date: 06/17/2022 There is no interpretation for this exam.  This order is for images obtained during a surgical procedure.  Please See "Surgeries" Tab for more information regarding the procedure.      Scheduled Meds:  amoxicillin-clavulanate  1 tablet Oral BID   aspirin  81 mg Oral Daily   atorvastatin  40 mg Oral Daily   carvedilol  12.5 mg Oral BID WC   Chlorhexidine Gluconate Cloth  6 each Topical Daily   cholecalciferol  1,000 Units Oral Daily   cyanocobalamin  500 mcg Oral q AM   docusate sodium  100 mg Oral BID   doxycycline  100 mg Oral BID PC   ferrous sulfate  325 mg Oral Q breakfast   hydrALAZINE  25 mg Oral TID   insulin aspart  0-15 Units Subcutaneous TID WC   insulin aspart  0-5 Units Subcutaneous QHS   insulin aspart protamine- aspart  9 Units Subcutaneous BID WC   isosorbide mononitrate  30 mg Oral Daily   pantoprazole  40 mg Oral Daily   sacubitril-valsartan  1 tablet Oral BID   Continuous Infusions:  sodium chloride     heparin 500 Units/hr (06/19/22 0600)   magnesium sulfate bolus IVPB       LOS: 12 days    Time spent 45 min Emilee Hero, MD [facility] 06/19/2022, 11:52 AM   Available via Epic secure chat 7am-7pm After these hours, please refer to coverage provider listed on [email or web address]

## 2022-06-20 ENCOUNTER — Ambulatory Visit (HOSPITAL_COMMUNITY): Admission: RE | Admit: 2022-06-20 | Payer: No Typology Code available for payment source | Source: Ambulatory Visit

## 2022-06-20 ENCOUNTER — Encounter (HOSPITAL_COMMUNITY): Payer: Medicare HMO | Admitting: Cardiology

## 2022-06-20 DIAGNOSIS — E1169 Type 2 diabetes mellitus with other specified complication: Secondary | ICD-10-CM | POA: Diagnosis not present

## 2022-06-20 DIAGNOSIS — E1151 Type 2 diabetes mellitus with diabetic peripheral angiopathy without gangrene: Secondary | ICD-10-CM | POA: Diagnosis not present

## 2022-06-20 DIAGNOSIS — K521 Toxic gastroenteritis and colitis: Secondary | ICD-10-CM

## 2022-06-20 DIAGNOSIS — T360X5A Adverse effect of penicillins, initial encounter: Secondary | ICD-10-CM

## 2022-06-20 DIAGNOSIS — Z794 Long term (current) use of insulin: Secondary | ICD-10-CM | POA: Diagnosis not present

## 2022-06-20 DIAGNOSIS — M86171 Other acute osteomyelitis, right ankle and foot: Secondary | ICD-10-CM | POA: Diagnosis not present

## 2022-06-20 LAB — BASIC METABOLIC PANEL
Anion gap: 7 (ref 5–15)
BUN: 27 mg/dL — ABNORMAL HIGH (ref 8–23)
CO2: 27 mmol/L (ref 22–32)
Calcium: 8.2 mg/dL — ABNORMAL LOW (ref 8.9–10.3)
Chloride: 100 mmol/L (ref 98–111)
Creatinine, Ser: 1.45 mg/dL — ABNORMAL HIGH (ref 0.61–1.24)
GFR, Estimated: 51 mL/min — ABNORMAL LOW (ref >60)
Glucose, Bld: 134 mg/dL — ABNORMAL HIGH (ref 70–99)
Potassium: 5 mmol/L (ref 3.5–5.1)
Sodium: 134 mmol/L — ABNORMAL LOW (ref 135–145)

## 2022-06-20 LAB — TYPE AND SCREEN
ABO/RH(D): O POS
Antibody Screen: NEGATIVE
Unit division: 0
Unit division: 0

## 2022-06-20 LAB — BPAM RBC
Blood Product Expiration Date: 202404242359
Blood Product Expiration Date: 202404242359
ISSUE DATE / TIME: 202403280417
Unit Type and Rh: 5100
Unit Type and Rh: 5100

## 2022-06-20 LAB — GLUCOSE, CAPILLARY
Glucose-Capillary: 116 mg/dL — ABNORMAL HIGH (ref 70–99)
Glucose-Capillary: 195 mg/dL — ABNORMAL HIGH (ref 70–99)
Glucose-Capillary: 157 mg/dL — ABNORMAL HIGH (ref 70–99)
Glucose-Capillary: 122 mg/dL — ABNORMAL HIGH (ref 70–99)

## 2022-06-20 LAB — CBC
HCT: 30 % — ABNORMAL LOW (ref 39.0–52.0)
Hemoglobin: 9.9 g/dL — ABNORMAL LOW (ref 13.0–17.0)
MCH: 27 pg (ref 26.0–34.0)
MCHC: 33 g/dL (ref 30.0–36.0)
MCV: 82 fL (ref 80.0–100.0)
Platelets: 328 10*3/uL (ref 150–400)
RBC: 3.66 MIL/uL — ABNORMAL LOW (ref 4.22–5.81)
RDW: 16.3 % — ABNORMAL HIGH (ref 11.5–15.5)
WBC: 11.9 10*3/uL — ABNORMAL HIGH (ref 4.0–10.5)
nRBC: 0 % (ref 0.0–0.2)

## 2022-06-20 LAB — HEPARIN LEVEL (UNFRACTIONATED): Heparin Unfractionated: <0.1 IU/mL — ABNORMAL LOW (ref 0.30–0.70)

## 2022-06-20 MED ORDER — DOCUSATE SODIUM 100 MG PO CAPS: 100.0000 mg | ORAL_CAPSULE | Freq: Every day | ORAL | Status: DC | PRN

## 2022-06-20 MED ORDER — FUROSEMIDE 10 MG/ML IJ SOLN
40.0000 mg | Freq: Once | INTRAMUSCULAR | Status: AC
  Administered 2022-06-20: 40 mg via INTRAVENOUS
  Filled 2022-06-20: qty 4

## 2022-06-20 MED ORDER — FUROSEMIDE 40 MG PO TABS
40.0000 mg | ORAL_TABLET | Freq: Every day | ORAL | Status: DC
  Administered 2022-06-20 – 2022-06-23 (×4): 40 mg via ORAL
  Filled 2022-06-20 (×4): qty 1

## 2022-06-20 NOTE — Progress Notes (Signed)
PROGRESS NOTE    Caleb [name]  [id number] DOB: [birth year] DOA: 06/07/2022 PCP: [name], MD   Brief Narrative:  Caleb [name] is a 72 year old man with PVD, DM 2, HTN, AAS s/p TAVR, HFrEF with a EF of 35% send CKD 3 was admitted with recurrent diabetic foot infection and acute on chronic kidney injury.  Workup has revealed acute osteomyelitis in the posterolateral aspect of the calcaneus.  Seen by ID and per their recommendations, he is on doxycycline/Augmentin pending surgery/bone biopsy per ID recommendations, for which he is potentially scheduled on 06/22/2022 with [name].  Patient is being seen by vascular surgery , Status post angiogram 3/25, with right femoral-tibial bypass graft. Did well postop.   Assessment & Plan:   Principal Problem:   Acute osteomyelitis of right calcaneus Active Problems:   Type 2 diabetes mellitus with hyperlipidemia   Acute kidney injury superimposed on chronic kidney disease   HTN (hypertension)   Hyperlipidemia   Hyponatremia   Dyslipidemia   PAD (peripheral artery disease)   S/P TAVR (transcatheter aortic valve replacement)   Decubitus ulcer of right heel   Diabetic foot ulcer   Gastroenteritis  Sepsis secondary to acute osteomyelitis right heel and PVD  -Sepsis POA  -Appreciate infectious disease -Antibiotics per ID, continue doxycycline, Augmentin -Aspirin, Plavix, lipitor  -Status post angiogram 3/25.  Status post femoral-tibial bypass.  Per [name] note, he is scheduled for surgical debridement on 06/22/2022.   AKI on CKD stage IIIb, resolved -Baseline Cr 1.5-2.5 -Appreciate nephrology -Thought to be secondary to hypovolemia while on Entresto.  [name] was resumed on 06/13/2022.  He is getting intermittent IV Lasix.  Will resume low-dose of Lasix now that his creatinine is actually at the best of his baseline. -Nephrology signed off 3/22.  Follow-up with [name] later this month -Improving   Hyperkalemia -Resolved    Diabetes mellitus type 2 with hyperglycemia -Blood sugar controlled, currently on NovoLog Mix 70/30 9 units twice daily as well as SSI.  DVT prophylaxis: SCD's Start: 06/17/22 1526 SCDs Start: 06/07/22 1348   Code Status: Full Code  Family Communication:  None present at bedside.  Plan of care discussed with patient in length and he/she verbalized understanding and agreed with it.  Status is: Inpatient Remains inpatient appropriate because: Patient needs surgical debridement for calcaneal osteomyelitis.   Estimated body mass index is 29.83 kg/m as calculated from the following:   Height as of this encounter: 5\' 7"  (1.702 m).   Weight as of this encounter: 86.4 kg.    Nutritional Assessment: Body mass index is 29.83 kg/m.Marland Kitchen Seen by dietician.  I agree with the assessment and plan as outlined below: Nutrition Status:        . Skin Assessment: I have examined the patient's skin and I agree with the wound assessment as performed by the wound care RN as outlined below:    Consultants:  Vascular surgery/orthopedics/ID  Procedures:  As above  Antimicrobials:  Anti-infectives (From admission, onward)    Start     Dose/Rate Route Frequency Ordered Stop   06/17/22 0740  ceFAZolin (ANCEF) 2-4 GM/100ML-% IVPB       Note to Pharmacy: Bobbie Stack M: cabinet override      06/17/22 0740 06/17/22 1944   06/16/22 1800  doxycycline (VIBRA-TABS) tablet 100 mg        100 mg Oral 2 times daily after meals 06/16/22 1600     06/09/22 1245  doxycycline (VIBRA-TABS) tablet 100 mg  Status:  Discontinued        100 mg Oral Every 12 hours 06/09/22 1145 06/16/22 1600   06/09/22 1245  amoxicillin-clavulanate (AUGMENTIN) 500-125 MG per tablet 1 tablet        1 tablet Oral 2 times daily 06/09/22 1145     06/08/22 1600  cefTRIAXone (ROCEPHIN) 2 g in sodium chloride 0.9 % 100 mL IVPB  Status:  Discontinued        2 g 200 mL/hr over 30 Minutes Intravenous Every 24 hours 06/08/22 0852 06/09/22 1145    06/08/22 1200  DAPTOmycin (CUBICIN) 600 mg in sodium chloride 0.9 % IVPB  Status:  Discontinued        8 mg/kg  77.4 kg 124 mL/hr over 30 Minutes Intravenous Every 48 hours 06/08/22 1012 06/09/22 1145   06/07/22 1415  linezolid (ZYVOX) IVPB 600 mg  Status:  Discontinued        600 mg 300 mL/hr over 60 Minutes Intravenous Every 12 hours 06/07/22 1414 06/08/22 0852   06/07/22 1415  ceFEPIme (MAXIPIME) 2 g in sodium chloride 0.9 % 100 mL IVPB  Status:  Discontinued        2 g 200 mL/hr over 30 Minutes Intravenous Daily 06/07/22 1414 06/08/22 0852         Subjective: Patient seen and barrios no complaints.  Objective: Vitals:   06/19/22 2336 06/20/22 0315 06/20/22 0845 06/20/22 1045  BP: 106/64  104/67 137/82  Pulse: 84  85 89  Resp: 18  16 14   Temp: 98 F (36.7 C)  98 F (36.7 C) 98.2 F (36.8 C)  TempSrc: Oral  Oral Oral  SpO2: 94%  97% 96%  Weight:  86.4 kg    Height:        Intake/Output Summary (Last 24 hours) at 06/20/2022 1201 Last data filed at 06/20/2022 1048 Gross per 24 hour  Intake 602.96 ml  Output 350 ml  Net 252.96 ml   Filed Weights   06/17/22 0500 06/19/22 0615 06/20/22 0315  Weight: 83.1 kg 88.3 kg 86.4 kg    Examination:  General exam: Appears calm and comfortable  Respiratory system: Clear to auscultation. Respiratory effort normal. Cardiovascular system: S1 & S2 heard, RRR. No JVD, murmurs, rubs, gallops or clicks. No pedal edema. Gastrointestinal system: Abdomen is nondistended, soft and nontender. No organomegaly or masses felt. Normal bowel sounds heard. Central nervous system: Alert and oriented. No focal neurological deficits. Extremities: Symmetric 5 x 5 power.  Has dressing/Ace wrap on the right lower extremity. Skin: No rashes, lesions or ulcers Psychiatry: Judgement and insight appear normal. Mood & affect appropriate.    Data Reviewed: I have personally reviewed following labs and imaging studies  CBC: Recent Labs  Lab  06/14/22 0119 06/16/22 0057 06/17/22 0048 06/18/22 0313 06/20/22 0104  WBC 10.6* 11.8* 10.9* 12.3* 11.9*  NEUTROABS  --  8.1*  --   --   --   HGB 11.7* 11.6* 11.2* 10.9* 9.9*  HCT 37.1* 36.4* 34.2* 32.6* 30.0*  MCV 82.8 82.5 81.0 81.1 82.0  PLT 332 348 323 316 [id number]   Basic Metabolic Panel: Recent Labs  Lab 06/15/22 0119 06/16/22 0057 06/17/22 0048 06/18/22 0313 06/20/22 0104  NA 133* 132* 133* 131* 134*  K 4.4 4.0 3.9 4.6 5.0  CL 99 96* 99 98 100  CO2 25 27 25 24 27   GLUCOSE 110* 124* 106* 148* 134*  BUN 21 20 15 19  27*  CREATININE 1.19 1.35* 1.19 1.36* 1.45*  CALCIUM  8.5* 8.5* 8.2* 8.2* 8.2*   GFR: Estimated Creatinine Clearance: 48.3 mL/min (A) (by C-G formula based on SCr of 1.45 mg/dL (H)). Liver Function Tests: No results for input(s): "AST", "ALT", "ALKPHOS", "BILITOT", "PROT", "ALBUMIN" in the last 168 hours. No results for input(s): "LIPASE", "AMYLASE" in the last 168 hours. No results for input(s): "AMMONIA" in the last 168 hours. Coagulation Profile: No results for input(s): "INR", "PROTIME" in the last 168 hours. Cardiac Enzymes: No results for input(s): "CKTOTAL", "CKMB", "CKMBINDEX", "TROPONINI" in the last 168 hours. BNP (last 3 results) No results for input(s): "PROBNP" in the last 8760 hours. HbA1C: No results for input(s): "HGBA1C" in the last 72 hours. CBG: Recent Labs  Lab 06/19/22 0614 06/19/22 1202 06/19/22 1609 06/19/22 2107 06/20/22 0627  GLUCAP 134* 131* 81 154* 116*   Lipid Profile: No results for input(s): "CHOL", "HDL", "LDLCALC", "TRIG", "CHOLHDL", "LDLDIRECT" in the last 72 hours. Thyroid Function Tests: No results for input(s): "TSH", "T4TOTAL", "FREET4", "T3FREE", "THYROIDAB" in the last 72 hours. Anemia Panel: No results for input(s): "VITAMINB12", "FOLATE", "FERRITIN", "TIBC", "IRON", "RETICCTPCT" in the last 72 hours. Sepsis Labs: No results for input(s): "PROCALCITON", "LATICACIDVEN" in the last 168 hours.  Recent  Results (from the past 240 hour(s))  Surgical pcr screen     Status: None   Collection Time: 06/16/22  9:23 PM   Specimen: Nasal Mucosa; Nasal Swab  Result Value Ref Range Status   MRSA, PCR NEGATIVE NEGATIVE Final   Staphylococcus aureus NEGATIVE NEGATIVE Final    Comment: (NOTE) The Xpert SA Assay (FDA approved for NASAL specimens in patients 21 years of age and older), is one component of a comprehensive surveillance program. It is not intended to diagnose infection nor to guide or monitor treatment. Performed at [facility], [place] [address] [place], [place] [address]      Radiology Studies: No results found.  Scheduled Meds:  amoxicillin-clavulanate  1 tablet Oral BID   aspirin  81 mg Oral Daily   atorvastatin  40 mg Oral Daily   carvedilol  12.5 mg Oral BID WC   Chlorhexidine Gluconate Cloth  6 each Topical Daily   cholecalciferol  1,000 Units Oral Daily   cyanocobalamin  500 mcg Oral q AM   doxycycline  100 mg Oral BID PC   ferrous sulfate  325 mg Oral Q breakfast   hydrALAZINE  25 mg Oral TID   insulin aspart  0-15 Units Subcutaneous TID WC   insulin aspart  0-5 Units Subcutaneous QHS   insulin aspart protamine- aspart  9 Units Subcutaneous BID WC   isosorbide mononitrate  30 mg Oral Daily   pantoprazole  40 mg Oral Daily   sacubitril-valsartan  1 tablet Oral BID   Continuous Infusions:  sodium chloride     heparin 500 Units/hr (06/20/22 [id number])   magnesium sulfate bolus IVPB       LOS: 13 days   Darliss Cheney, MD [facility]  06/20/2022, 12:01 PM   *Please note that this is a verbal dictation therefore any spelling or grammatical errors are due to the "[place] [employer]" system interpretation.  Please page via [place] and do not message via secure chat for urgent patient care matters. Secure chat can be used for non urgent patient care matters.  How to contact the [facility] Attending or Consulting provider [place] or covering provider during after  hours [place], for this patient?  Check the care team in [facility] and look for a)  attending/consulting [facility] provider listed and b) the [facility] team listed. Page or secure chat [phone]. Log into [email or web address] and use [place]'s universal password to access. If you do not have the password, please contact the hospital operator. Locate the [facility] provider you are looking for under [facility] and page to a number that you can be directly reached. If you still have difficulty reaching the provider, please page the [facility] (Director on Call) for the Hospitalists listed on amion for assistance.

## 2022-06-20 NOTE — Progress Notes (Addendum)
  Progress Note    06/20/2022 8:03 AM 3 Days Post-Op  Subjective:  no complaints   Vitals:   06/19/22 2015 06/19/22 2336  BP: 108/72 106/64  Pulse: 83 84  Resp: 14 18  Temp: (!) 97.1 F (36.2 C) 98 F (36.7 C)  SpO2: 100% 94%   Physical Exam: Lungs:  non labored Incisions:  incisions of R leg are well appearing Extremities:  brisk R DP by doppler; palpable graft pulse Neurologic: A&O  CBC    Component Value Date/Time   WBC 11.9 (H) 06/20/2022 0104   RBC 3.66 (L) 06/20/2022 0104   HGB 9.9 (L) 06/20/2022 0104   HGB 14.6 03/09/2022 1233   HGB 13.3 11/15/2019 1139   HCT 30.0 (L) 06/20/2022 0104   HCT 40.2 11/15/2019 1139   PLT 328 06/20/2022 0104   PLT 216 03/09/2022 1233   PLT 300 11/15/2019 1139   MCV 82.0 06/20/2022 0104   MCV 86 11/15/2019 1139   MCH 27.0 06/20/2022 0104   MCHC 33.0 06/20/2022 0104   RDW 16.3 (H) 06/20/2022 0104   RDW 14.5 11/15/2019 1139   LYMPHSABS 1.7 06/16/2022 0057   LYMPHSABS 3.3 (H) 09/28/2017 1113   MONOABS 1.3 (H) 06/16/2022 0057   EOSABS 0.6 (H) 06/16/2022 0057   EOSABS 0.3 09/28/2017 1113   BASOSABS 0.0 06/16/2022 0057   BASOSABS 0.0 09/28/2017 1113    BMET    Component Value Date/Time   NA 134 (L) 06/20/2022 0104   NA 142 02/16/2022 1544   K 5.0 06/20/2022 0104   CL 100 06/20/2022 0104   CO2 27 06/20/2022 0104   GLUCOSE 134 (H) 06/20/2022 0104   BUN 27 (H) 06/20/2022 0104   BUN 22 02/16/2022 1544   CREATININE 1.45 (H) 06/20/2022 0104   CREATININE 1.36 (H) 03/09/2022 1233   CREATININE 0.86 06/06/2016 1455   CALCIUM 8.2 (L) 06/20/2022 0104   GFRNONAA 51 (L) 06/20/2022 0104   GFRNONAA 56 (L) 03/09/2022 1233   GFRNONAA 87 04/02/2013 1632   GFRAA >60 12/17/2019 0354   GFRAA >89 04/02/2013 1632    INR    Component Value Date/Time   INR 1.0 01/11/2022 0957     Intake/Output Summary (Last 24 hours) at 06/20/2022 0803 Last data filed at 06/20/2022 [id number] Gross per 24 hour  Intake 602.96 ml  Output 350 ml  Net 252.96  ml     Assessment/Plan:  72 y.o. male is s/p R CFA endarterectomy and femoral to ATA bypass with vein 3 Days Post-Op   RLE well perfused with brisk DP by doppler and palpable graft pulse RLE edema: re-wrapped RLE with ACE; continue elevation when in bed; one time lasix dose today Continue current IV abx regimen; [name] will evaluate R heel this week   Dagoberto Ligas, PA-C [facility] [phone] 06/20/2022 8:03 AM   VASCULAR STAFF ADDENDUM: I have independently interviewed and examined the patient. I agree with the above.  Excellent signal in the foot. Wounds healing.  Edema managed with ACE - can transition to TED hose.  Will needs transition to Eliquis and ASA once evaluated by [name] later this week when he returns.  OOB as tolerated.    Cassandria Santee, MD [facility] Phone Number: [phone] 06/20/2022 2:54 PM

## 2022-06-20 NOTE — Progress Notes (Signed)
Orthopedic Tech Progress Note Patient Details:  Caleb [name] [birth year] [id number]  Called in order to [employer] for a HEEL OFFLOADING POST OP SHOE. Only have [place] in house   Patient ID: Caleb [name], male   DOB: [birth year], 72 y.o.   MRN: [id number]  [name] 06/20/2022, 11:12 AM

## 2022-06-20 NOTE — Progress Notes (Signed)
Occupational Therapy Treatment Patient Details Name: Caleb [name] MRN: [id number] DOB: [birth year] Today's Date: 06/20/2022   History of present illness Pt is a 72 y.o. male admitted 3/19 with R calcaneal osteomyelitis. Pt underwent RLE angiogram 3/25. Tentative surgical plan for RLE bypass 3/29. PMH: IDDM, GERD, HTN, hypercholesterolemia, hyponatremia, chronic systolic CHF with ejection fraction 35 to 40%, h/o TAVR, PVD, neuropathy.   OT comments  Pt continuing to progress towards patient focused goals. Pt presenting as similar level to last session, completing functional ambulation with min guard assist + RW. Pt initially reluctant to take part in OT but was willing to go on small walk with encouragement. Pt fatigued at end of OT session, following walk to doorway. Pt educated on how to don/doff post op shoe, verbalized understanding. Pt remains appropriate for Home OT services at this time with recommended level of support and DME.   Recommendations for follow up therapy are one component of a multi-disciplinary discharge planning process, led by the attending physician.  Recommendations may be updated based on patient status, additional functional criteria and insurance authorization.    Assistance Recommended at Discharge PRN  Patient can return home with the following  A little help with walking and/or transfers;A little help with bathing/dressing/bathroom;Assistance with cooking/housework;Assist for transportation;Help with stairs or ramp for entrance   Equipment Recommendations  BSC/3in1    Recommendations for Other Services      Precautions / Restrictions Precautions Precautions: Other (comment) Precaution Comments: NWB R heel. R post op shoe, TDWB acceptable Restrictions Weight Bearing Restrictions: No       Mobility Bed Mobility   Bed Mobility: Sit to Supine, Supine to Sit     Supine to sit: Supervision, HOB elevated Sit to supine: Supervision, HOB elevated   General  bed mobility comments: uses rails to assist with scooting up in bed while supine. Supervision for lateral scooting to [facility]    Transfers Overall transfer level: Needs assistance Equipment used: Rolling walker (2 wheels) Transfers: Sit to/from Stand Sit to Stand: Min guard                 Balance Overall balance assessment: Needs assistance Sitting-balance support: Feet supported, No upper extremity supported Sitting balance-Leahy Scale: Good     Standing balance support: Bilateral upper extremity supported, No upper extremity supported, During functional activity Standing balance-Leahy Scale: Fair                             ADL either performed or assessed with clinical judgement   ADL Overall ADL's : Needs assistance/impaired                       Lower Body Dressing Details (indicate cue type and reason): Max A to don post op shoe, educated patient on use, Min A to doff R post op shoe             Functional mobility during ADLs: Min guard;Rolling walker (2 wheels) General ADL Comments: Session focus on increasing overall activity tolerance and promoting functional mobility    Extremity/Trunk Assessment              Vision       Perception     Praxis      Cognition Arousal/Alertness: Awake/alert Behavior During Therapy: WFL for tasks assessed/performed Overall Cognitive Status: Within Functional Limits for tasks assessed  Exercises      Shoulder Instructions       General Comments VSS on RA. Pt reports "soreness but no pain"    Pertinent Vitals/ Pain       Pain Assessment Pain Assessment: No/denies pain  Home Living                                          Prior Functioning/Environment              Frequency  Min 2X/week        Progress Toward Goals  OT Goals(current goals can now be found in the care plan section)  Progress  towards OT goals: Progressing toward goals  Acute Rehab OT Goals Patient Stated Goal: to get all of this under control OT Goal Formulation: With patient Time For Goal Achievement: 06/27/22 Potential to Achieve Goals: Good  Plan Discharge plan remains appropriate;Frequency remains appropriate    Co-evaluation                 AM-PAC OT "6 Clicks" Daily Activity     Outcome Measure   Help from another person eating meals?: None Help from another person taking care of personal grooming?: None Help from another person toileting, which includes using toliet, bedpan, or urinal?: A Little Help from another person bathing (including washing, rinsing, drying)?: A Little Help from another person to put on and taking off regular upper body clothing?: A Little Help from another person to put on and taking off regular lower body clothing?: A Lot 6 Click Score: 19    End of Session Equipment Utilized During Treatment: Gait belt;Rolling walker (2 wheels)  OT Visit Diagnosis: Unsteadiness on feet (R26.81);Pain Pain - Right/Left: Right Pain - part of body: Ankle and joints of foot   Activity Tolerance Patient tolerated treatment well   Patient Left in bed;with call bell/phone within reach   Nurse Communication Mobility status        Time: 1430-1455 OT Time Calculation (min): 25 min  Charges: OT General Charges $OT Visit: 1 Visit OT Treatments $Therapeutic Activity: 23-37 mins  06/20/2022  AB, OTR/L  Acute Rehabilitation Services  Office: [phone]   Cori Razor 06/20/2022, 3:06 PM

## 2022-06-20 NOTE — Progress Notes (Signed)
[facility] for Infectious Disease    Date of Admission:  06/07/2022      ID: Caleb [name] is a 72 y.o. male with   Principal Problem:   Acute osteomyelitis of right calcaneus Active Problems:   HTN (hypertension)   Hyperlipidemia   Hyponatremia   Dyslipidemia   Type 2 diabetes mellitus with hyperlipidemia   Acute kidney injury superimposed on chronic kidney disease   PAD (peripheral artery disease)   S/P TAVR (transcatheter aortic valve replacement)   Decubitus ulcer of right heel   Diabetic foot ulcer   Gastroenteritis    Subjective: Still concern about leg pain but improving overall. Loose stool x 2 per day, still receiving colace=which he has deferred.  Medications:   amoxicillin-clavulanate  1 tablet Oral BID   aspirin  81 mg Oral Daily   atorvastatin  40 mg Oral Daily   carvedilol  12.5 mg Oral BID WC   Chlorhexidine Gluconate Cloth  6 each Topical Daily   cholecalciferol  1,000 Units Oral Daily   cyanocobalamin  500 mcg Oral q AM   doxycycline  100 mg Oral BID PC   ferrous sulfate  325 mg Oral Q breakfast   furosemide  40 mg Oral Daily   hydrALAZINE  25 mg Oral TID   insulin aspart  0-15 Units Subcutaneous TID WC   insulin aspart  0-5 Units Subcutaneous QHS   insulin aspart protamine- aspart  9 Units Subcutaneous BID WC   isosorbide mononitrate  30 mg Oral Daily   pantoprazole  40 mg Oral Daily   sacubitril-valsartan  1 tablet Oral BID    Objective: Vital signs in last 24 hours: Temp:  [97.1 F (36.2 C)-98.2 F (36.8 C)] 98.2 F (36.8 C) (04/01 1045) Pulse Rate:  [83-89] 89 (04/01 1045) Resp:  [14-18] 14 (04/01 1045) BP: (104-137)/(64-87) 137/82 (04/01 1045) SpO2:  [94 %-100 %] 96 % (04/01 1045) Weight:  [86.4 kg] 86.4 kg (04/01 0315)  Physical Exam  Constitutional: He is oriented to person, place, and time. He appears well-developed and well-nourished. No distress.  HENT:  Mouth/Throat: Oropharynx is clear and moist. No oropharyngeal  exudate.  Cardiovascular: Normal rate, regular rhythm and normal heart sounds. Exam reveals no gallop and no friction rub.  No murmur heard.  Pulmonary/Chest: Effort normal and breath sounds normal. No respiratory distress. He has no wheezes.  Abdominal: Soft. Bowel sounds are normal. He exhibits no distension. There is no tenderness.  [id number] foot wrapped Skin: Skin is warm and dry. No rash noted. No erythema.  Psychiatric: He has a normal mood and affect. His behavior is normal.    Lab Results Recent Labs    06/18/22 0313 06/20/22 0104  WBC 12.3* 11.9*  HGB 10.9* 9.9*  HCT 32.6* 30.0*  NA 131* 134*  K 4.6 5.0  CL 98 100  CO2 24 27  BUN 19 27*  CREATININE 1.36* 1.45*   Liver Panel No results for input(s): "PROT", "ALBUMIN", "AST", "ALT", "ALKPHOS", "BILITOT", "BILIDIR", "IBILI" in the last 72 hours. Sedimentation Rate No results for input(s): "ESRSEDRATE" in the last 72 hours. C-Reactive Protein No results for input(s): "CRP" in the last 72 hours.  Microbiology: reviewed Studies/Results: No results found.   Assessment/Plan: Calcaneal osteomyelitis = continue on doxy and amox/clav. Await to see if going for debridement later this week, potentially on 4/3. Will ask dr duda to resend or cultures  Abtx associated diarrhea = not uncommon with amox/clav. I have stopped colace  since he doesn't need it  Pvd/pad= s/p femoral-tibial bypass. Continue with vascular team's recs. Treat post op pain.  [facility] for Infectious Diseases Pager: [phone]  06/20/2022, 2:05 PM

## 2022-06-21 ENCOUNTER — Other Ambulatory Visit (HOSPITAL_COMMUNITY): Payer: Self-pay

## 2022-06-21 ENCOUNTER — Ambulatory Visit: Payer: Self-pay

## 2022-06-21 DIAGNOSIS — M86171 Other acute osteomyelitis, right ankle and foot: Secondary | ICD-10-CM | POA: Diagnosis not present

## 2022-06-21 DIAGNOSIS — E1169 Type 2 diabetes mellitus with other specified complication: Secondary | ICD-10-CM | POA: Diagnosis not present

## 2022-06-21 DIAGNOSIS — E1151 Type 2 diabetes mellitus with diabetic peripheral angiopathy without gangrene: Secondary | ICD-10-CM | POA: Diagnosis not present

## 2022-06-21 DIAGNOSIS — Z794 Long term (current) use of insulin: Secondary | ICD-10-CM | POA: Diagnosis not present

## 2022-06-21 LAB — GLUCOSE, CAPILLARY
Glucose-Capillary: 129 mg/dL — ABNORMAL HIGH (ref 70–99)
Glucose-Capillary: 154 mg/dL — ABNORMAL HIGH (ref 70–99)
Glucose-Capillary: 156 mg/dL — ABNORMAL HIGH (ref 70–99)
Glucose-Capillary: 181 mg/dL — ABNORMAL HIGH (ref 70–99)

## 2022-06-21 LAB — HEPARIN LEVEL (UNFRACTIONATED): Heparin Unfractionated: <0.1 IU/mL — ABNORMAL LOW (ref 0.30–0.70)

## 2022-06-21 MED ORDER — AMOXICILLIN-POT CLAVULANATE 875-125 MG PO TABS
1.0000 | ORAL_TABLET | Freq: Two times a day (BID) | ORAL | Status: DC
  Administered 2022-06-21 – 2022-06-30 (×19): 1 via ORAL
  Filled 2022-06-21 (×19): qty 1

## 2022-06-21 NOTE — Progress Notes (Signed)
[facility] for Infectious Disease    Date of Admission:  06/07/2022   Total days of antibiotics -since 3/19           ID: Caleb [name] is a 72 y.o. male with   Principal Problem:   Acute osteomyelitis of right calcaneus Active Problems:   HTN (hypertension)   Hyperlipidemia   Hyponatremia   Dyslipidemia   Type 2 diabetes mellitus with hyperlipidemia   Acute kidney injury superimposed on chronic kidney disease   PAD (peripheral artery disease)   S/P TAVR (transcatheter aortic valve replacement)   Decubitus ulcer of right heel   Diabetic foot ulcer   Gastroenteritis    Subjective: Afebrile, better pain control. Going to OR on Friday for calcaneal debridement  Medications:   amoxicillin-clavulanate  1 tablet Oral Q12H   aspirin  81 mg Oral Daily   atorvastatin  40 mg Oral Daily   carvedilol  12.5 mg Oral BID WC   Chlorhexidine Gluconate Cloth  6 each Topical Daily   cholecalciferol  1,000 Units Oral Daily   cyanocobalamin  500 mcg Oral q AM   doxycycline  100 mg Oral BID PC   ferrous sulfate  325 mg Oral Q breakfast   furosemide  40 mg Oral Daily   hydrALAZINE  25 mg Oral TID   insulin aspart  0-15 Units Subcutaneous TID WC   insulin aspart  0-5 Units Subcutaneous QHS   insulin aspart protamine- aspart  9 Units Subcutaneous BID WC   isosorbide mononitrate  30 mg Oral Daily   pantoprazole  40 mg Oral Daily   sacubitril-valsartan  1 tablet Oral BID    Objective: Vital signs in last 24 hours: Temp:  [97.9 F (36.6 C)-98.7 F (37.1 C)] 98 F (36.7 C) (04/02 1118) Pulse Rate:  [67-102] 76 (04/02 1118) Resp:  [12-20] 20 (04/02 1118) BP: (86-140)/(56-95) 100/70 (04/02 1118) SpO2:  [93 %-99 %] 96 % (04/02 1118) Weight:  [85.8 kg] 85.8 kg (04/02 0636)  Physical Exam  Constitutional: He is oriented to person, place, and time. He appears well-developed and well-nourished. No distress.  HENT:  Mouth/Throat: Oropharynx is clear and moist. No oropharyngeal  exudate.  Cardiovascular: Normal rate, regular rhythm and normal heart sounds. Exam reveals no gallop and no friction rub.  No murmur heard.  Pulmonary/Chest: Effort normal and breath sounds normal. No respiratory distress. He has no wheezes.  Abdominal: Soft. Bowel sounds are normal. He exhibits no distension. There is no tenderness.  Lymphadenopathy:  He has no cervical adenopathy.  Neurological: He is alert and oriented to person, place, and time.  Skin: Skin is warm and dry. No rash noted. No erythema.  Psychiatric: He has a normal mood and affect. His behavior is normal.    Lab Results Recent Labs    06/20/22 0104  WBC 11.9*  HGB 9.9*  HCT 30.0*  NA 134*  K 5.0  CL 100  CO2 27  BUN 27*  CREATININE 1.45*   Lab Results  Component Value Date   ESRSEDRATE 92 (H) 06/09/2022    Microbiology: reviewed Studies/Results: No results found.   Assessment/Plan: Right calcaneal osteomyelitis = continue on amox/clav and doxy. Appears to have loose stools with abtx but do not suspect cdifficile.   For Friday's surgery, please have tissue sent for culture to determine if need to change regimen  Pad s/p bypass= incision sites are c/d/I no signs of infection  Diarrhea = thought to be 2/2 abtx. Have  stopped colace  [facility] for Infectious Diseases Pager: [phone]  06/21/2022, 3:22 PM

## 2022-06-21 NOTE — Progress Notes (Signed)
PROGRESS NOTE    [name]  [id number] DOB: [birth year] DOA: 06/07/2022 PCP: [name], MD   Brief Narrative:  Caleb [name] is a 72 year old man with PVD, DM 2, HTN, AAS s/p TAVR, HFrEF with a EF of 35% send CKD 3 was admitted with recurrent diabetic foot infection and acute on chronic kidney injury.  Workup has revealed acute osteomyelitis in the posterolateral aspect of the calcaneus.  Seen by ID and per their recommendations, he is on doxycycline/Augmentin pending surgery/bone biopsy per ID recommendations, for which he is potentially scheduled on 06/22/2022 with [name].  Patient is being seen by vascular surgery , Status post angiogram 3/25, with right femoral-tibial bypass graft. Did well postop.   Assessment & Plan:   Principal Problem:   Acute osteomyelitis of right calcaneus Active Problems:   Type 2 diabetes mellitus with hyperlipidemia   Acute kidney injury superimposed on chronic kidney disease   HTN (hypertension)   Hyperlipidemia   Hyponatremia   Dyslipidemia   PAD (peripheral artery disease)   S/P TAVR (transcatheter aortic valve replacement)   Decubitus ulcer of right heel   Diabetic foot ulcer   Gastroenteritis  Sepsis secondary to acute osteomyelitis right heel and PVD  -Sepsis POA  -Appreciate infectious disease -Antibiotics per ID, continue doxycycline, Augmentin -Aspirin, Plavix, lipitor  -Status post angiogram 3/25.  Status post femoral-tibial bypass.  Per [name] note, he is scheduled for surgical debridement on Friday.   AKI on CKD stage IIIb, resolved -Baseline Cr 1.5-2.5 -Appreciate nephrology -Thought to be secondary to hypovolemia while on Entresto.  [name] was resumed on 06/13/2022.  He is getting intermittent IV Lasix.  Scheduled oral Lasix resumed on 06/20/2022. -Nephrology signed off 3/22.  Follow-up with [name] later this month -Improving   Hyperkalemia -Resolved   Diabetes mellitus type 2 with hyperglycemia -Blood  sugar controlled, currently on NovoLog Mix 70/30 9 units twice daily as well as SSI.  DVT prophylaxis: Place TED hose Start: 06/21/22 0823 SCD's Start: 06/17/22 1526 SCDs Start: 06/07/22 1348   Code Status: Full Code  Family Communication:  None present at bedside.  Plan of care discussed with patient in length and he/she verbalized understanding and agreed with it.  Status is: Inpatient Remains inpatient appropriate because: Patient needs surgical debridement for calcaneal osteomyelitis scheduled on Friday..   Estimated body mass index is 29.63 kg/m as calculated from the following:   Height as of this encounter: 5\' 7"  (1.702 m).   Weight as of this encounter: 85.8 kg.    Nutritional Assessment: Body mass index is 29.63 kg/m.Marland Kitchen Seen by dietician.  I agree with the assessment and plan as outlined below: Nutrition Status:        . Skin Assessment: I have examined the patient's skin and I agree with the wound assessment as performed by the wound care RN as outlined below:    Consultants:  Vascular surgery/orthopedics/ID  Procedures:  As above  Antimicrobials:  Anti-infectives (From admission, onward)    Start     Dose/Rate Route Frequency Ordered Stop   06/21/22 1000  amoxicillin-clavulanate (AUGMENTIN) 875-125 MG per tablet 1 tablet        1 tablet Oral Every 12 hours 06/21/22 0657     06/17/22 0740  ceFAZolin (ANCEF) 2-4 GM/100ML-% IVPB       Note to Pharmacy: Bobbie Stack M: cabinet override      06/17/22 0740 06/17/22 1944   06/16/22 1800  doxycycline (VIBRA-TABS) tablet 100 mg  100 mg Oral 2 times daily after meals 06/16/22 1600     06/09/22 1245  doxycycline (VIBRA-TABS) tablet 100 mg  Status:  Discontinued        100 mg Oral Every 12 hours 06/09/22 1145 06/16/22 1600   06/09/22 1245  amoxicillin-clavulanate (AUGMENTIN) 500-125 MG per tablet 1 tablet  Status:  Discontinued        1 tablet Oral 2 times daily 06/09/22 1145 06/21/22 0657   06/08/22 1600   cefTRIAXone (ROCEPHIN) 2 g in sodium chloride 0.9 % 100 mL IVPB  Status:  Discontinued        2 g 200 mL/hr over 30 Minutes Intravenous Every 24 hours 06/08/22 0852 06/09/22 1145   06/08/22 1200  DAPTOmycin (CUBICIN) 600 mg in sodium chloride 0.9 % IVPB  Status:  Discontinued        8 mg/kg  77.4 kg 124 mL/hr over 30 Minutes Intravenous Every 48 hours 06/08/22 1012 06/09/22 1145   06/07/22 1415  linezolid (ZYVOX) IVPB 600 mg  Status:  Discontinued        600 mg 300 mL/hr over 60 Minutes Intravenous Every 12 hours 06/07/22 1414 06/08/22 0852   06/07/22 1415  ceFEPIme (MAXIPIME) 2 g in sodium chloride 0.9 % 100 mL IVPB  Status:  Discontinued        2 g 200 mL/hr over 30 Minutes Intravenous Daily 06/07/22 1414 06/08/22 0852         Subjective: Patient seen and examined.  No complaints.  Objective: Vitals:   06/21/22 0456 06/21/22 0636 06/21/22 0818 06/21/22 1118  BP: 92/63  (!) 140/95 100/70  Pulse: 70  (!) 102 76  Resp: 16  18 20   Temp: 97.9 F (36.6 C)  98.1 F (36.7 C) 98 F (36.7 C)  TempSrc: Oral  Oral Oral  SpO2: 95%  93% 96%  Weight:  85.8 kg    Height:        Intake/Output Summary (Last 24 hours) at 06/21/2022 1124 Last data filed at 06/21/2022 0820 Gross per 24 hour  Intake 925.67 ml  Output --  Net 925.67 ml    Filed Weights   06/19/22 0615 06/20/22 0315 06/21/22 0636  Weight: 88.3 kg 86.4 kg 85.8 kg    Examination: General exam: Appears calm and comfortable  Respiratory system: Clear to auscultation. Respiratory effort normal. Cardiovascular system: S1 & S2 heard, RRR. No JVD, murmurs, rubs, gallops or clicks. No pedal edema. Gastrointestinal system: Abdomen is nondistended, soft and nontender. No organomegaly or masses felt. Normal bowel sounds heard. Central nervous system: Alert and oriented. No focal neurological deficits. Extremities: Symmetric 5 x 5 power.  Has dressing in the right lower extremity. Skin: No rashes, lesions or ulcers.  Psychiatry:  Judgement and insight appear normal. Mood & affect appropriate.   Data Reviewed: I have personally reviewed following labs and imaging studies  CBC: Recent Labs  Lab 06/16/22 0057 06/17/22 0048 06/18/22 0313 06/20/22 0104  WBC 11.8* 10.9* 12.3* 11.9*  NEUTROABS 8.1*  --   --   --   HGB 11.6* 11.2* 10.9* 9.9*  HCT 36.4* 34.2* 32.6* 30.0*  MCV 82.5 81.0 81.1 82.0  PLT 348 323 316 [id number]    Basic Metabolic Panel: Recent Labs  Lab 06/15/22 0119 06/16/22 0057 06/17/22 0048 06/18/22 0313 06/20/22 0104  NA 133* 132* 133* 131* 134*  K 4.4 4.0 3.9 4.6 5.0  CL 99 96* 99 98 100  CO2 25 27 25 24 27   GLUCOSE 110* 124*  106* 148* 134*  BUN 21 20 15 19  27*  CREATININE 1.19 1.35* 1.19 1.36* 1.45*  CALCIUM 8.5* 8.5* 8.2* 8.2* 8.2*    GFR: Estimated Creatinine Clearance: 48.2 mL/min (A) (by C-G formula based on SCr of 1.45 mg/dL (H)). Liver Function Tests: No results for input(s): "AST", "ALT", "ALKPHOS", "BILITOT", "PROT", "ALBUMIN" in the last 168 hours. No results for input(s): "LIPASE", "AMYLASE" in the last 168 hours. No results for input(s): "AMMONIA" in the last 168 hours. Coagulation Profile: No results for input(s): "INR", "PROTIME" in the last 168 hours. Cardiac Enzymes: No results for input(s): "CKTOTAL", "CKMB", "CKMBINDEX", "TROPONINI" in the last 168 hours. BNP (last 3 results) No results for input(s): "PROBNP" in the last 8760 hours. HbA1C: No results for input(s): "HGBA1C" in the last 72 hours. CBG: Recent Labs  Lab 06/20/22 0627 06/20/22 1200 06/20/22 1548 06/20/22 2110 06/21/22 0555  GLUCAP 116* 195* 157* 122* 129*    Lipid Profile: No results for input(s): "CHOL", "HDL", "LDLCALC", "TRIG", "CHOLHDL", "LDLDIRECT" in the last 72 hours. Thyroid Function Tests: No results for input(s): "TSH", "T4TOTAL", "FREET4", "T3FREE", "THYROIDAB" in the last 72 hours. Anemia Panel: No results for input(s): "VITAMINB12", "FOLATE", "FERRITIN", "TIBC", "IRON",  "RETICCTPCT" in the last 72 hours. Sepsis Labs: No results for input(s): "PROCALCITON", "LATICACIDVEN" in the last 168 hours.  Recent Results (from the past 240 hour(s))  Surgical pcr screen     Status: None   Collection Time: 06/16/22  9:23 PM   Specimen: Nasal Mucosa; Nasal Swab  Result Value Ref Range Status   MRSA, PCR NEGATIVE NEGATIVE Final   Staphylococcus aureus NEGATIVE NEGATIVE Final    Comment: (NOTE) The Xpert SA Assay (FDA approved for NASAL specimens in patients 49 years of age and older), is one component of a comprehensive surveillance program. It is not intended to diagnose infection nor to guide or monitor treatment. Performed at [facility], [place] [address], [place], [place] [address]      Radiology Studies: No results found.  Scheduled Meds:  amoxicillin-clavulanate  1 tablet Oral Q12H   aspirin  81 mg Oral Daily   atorvastatin  40 mg Oral Daily   carvedilol  12.5 mg Oral BID WC   Chlorhexidine Gluconate Cloth  6 each Topical Daily   cholecalciferol  1,000 Units Oral Daily   cyanocobalamin  500 mcg Oral q AM   doxycycline  100 mg Oral BID PC   ferrous sulfate  325 mg Oral Q breakfast   furosemide  40 mg Oral Daily   hydrALAZINE  25 mg Oral TID   insulin aspart  0-15 Units Subcutaneous TID WC   insulin aspart  0-5 Units Subcutaneous QHS   insulin aspart protamine- aspart  9 Units Subcutaneous BID WC   isosorbide mononitrate  30 mg Oral Daily   pantoprazole  40 mg Oral Daily   sacubitril-valsartan  1 tablet Oral BID   Continuous Infusions:  sodium chloride     heparin 500 Units/hr (06/20/22 [id number])   magnesium sulfate bolus IVPB       LOS: 14 days   Darliss Cheney, MD [facility]  06/21/2022, 11:24 AM   *Please note that this is a verbal dictation therefore any spelling or grammatical errors are due to the "[place] [employer]" system interpretation.  Please page via [place] and do not message via secure chat for urgent patient care  matters. Secure chat can be used for non urgent patient care matters.  How to contact the [facility]  Attending or Consulting provider [place] or covering provider during after hours [place], for this patient?  Check the care team in [facility] and look for a) attending/consulting TRH provider listed and b) the [facility] team listed. Page or secure chat [phone]. Log into [email or web address] and use [place]'s universal password to access. If you do not have the password, please contact the hospital operator. Locate the [facility] provider you are looking for under [facility] and page to a number that you can be directly reached. If you still have difficulty reaching the provider, please page the [facility] (Director on Call) for the Hospitalists listed on amion for assistance.

## 2022-06-21 NOTE — TOC Benefit Eligibility Note (Signed)
Patient Advocate Encounter  Insurance verification completed.    The patient is currently admitted and upon discharge could be taking Eliquis 5 mg.  The current 30 day co-pay is $45.00.   The patient is insured through [employer] Medicare Part D   This test claim was processed through [facility]- copay amounts may vary at other pharmacies due to pharmacy/plan contracts, or as the patient moves through the different stages of their insurance plan.  Alaina Donati, CPHT Pharmacy Patient Advocate Specialist [facility] Patient Advocate Team Direct Number: [phone]  Fax: [phone]       

## 2022-06-21 NOTE — Patient Outreach (Signed)
  Care Coordination   Follow Up Visit Note   06/21/2022 Name: SULEMAN WOOLS MRN: [id number] DOB: [birth year]  [name] is a 72 y.o. year old male who sees [name], MD for primary care. I  reviewed patient's chart today in preparation to contact patient for a nurse care coordination follow up call.   What matters to the patients health and wellness today?  Patient remains to be inpatient at [facility].     Goals Addressed             This Visit's Progress    No complications from Decubitus ulcer of right heel       Care Coordination Interventions: Reviewed chart in preparation to contact patient for RN CC follow up  Noted patient remains to be inpatient at [facility], admit date: 06/07/22; dx: Acute osteomyelitis of right calcaneus         SDOH assessments and interventions completed:  No     Care Coordination Interventions:  Yes, provided   Follow up plan: Follow up call scheduled for 06/30/22 @11 :30 AM    Encounter Outcome:  Pt. Visit Completed

## 2022-06-21 NOTE — H&P (View-Only) (Signed)
 ORTHOPAEDIC CONSULTATION  REQUESTING PHYSICIAN: Pahwani, Ravi, MD  Chief Complaint: Draining ulcer right heel.  HPI: Caleb [name] is a 72 y.o. male who presents with peripheral vascular disease and osteomyelitis of the right calcaneus with a dry gangrenous ulcer over the plantar aspect of the right calcaneus.  Patient has peripheral vascular disease, status post revascularization right lower extremity during this admission.  Past medical history positive for diabetes, peripheral vascular disease.  Past Medical History:  Diagnosis Date   Carotid artery occlusion    Diabetes mellitus without complication (HCC)    Type II   Dyslipidemia 09/27/2012   Erectile dysfunction 09/27/2012   GERD (gastroesophageal reflux disease)    Hypercholesteremia    Hyperlipidemia 08/12/2012   Hypertension    Hyponatremia 08/14/2012   Neuropathy    Pancreatitis    Pancreatitis, acute 09/27/2012   Peripheral vascular disease (HCC)    S/P 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 Cooper & Bartle   Severe aortic stenosis    Vitamin D deficiency 06/23/2015   Past Surgical History:  Procedure Laterality Date   ABDOMINAL AORTOGRAM W/LOWER EXTREMITY N/A 08/08/2019   Procedure: ABDOMINAL AORTOGRAM W/LOWER EXTREMITY;  Surgeon: Berry, Jonathan J, MD;  [facility]: [facility];  Service: Cardiovascular;  Laterality: N/A;   ABDOMINAL AORTOGRAM W/LOWER EXTREMITY N/A 11/18/2019   Procedure: ABDOMINAL AORTOGRAM W/LOWER EXTREMITY;  Surgeon: Berry, Jonathan J, MD;  Location: [facility];  Service: Cardiovascular;  Laterality: N/A;   ABDOMINAL AORTOGRAM W/LOWER EXTREMITY N/A 06/13/2022   Procedure: ABDOMINAL AORTOGRAM W/LOWER EXTREMITY;  Surgeon: Dickson, Christopher S, MD;  [facility]: [facility];  Service: Cardiovascular;  Laterality: N/A;   CARDIAC CATHETERIZATION     COLONOSCOPY  [facility] pt   ENDARTERECTOMY FEMORAL Right 06/17/2022   Procedure: SUPERFICIAL ENDARTERECTOMY OF COMMON FEMORAL ARTERY;  Surgeon: Robins, Joshua E, MD;  Location: [facility];  Service: Vascular;  Laterality: Right;   FEMORAL-TIBIAL BYPASS GRAFT Right 06/17/2022   Procedure: RIGHT FEMORAL- ANTERIOR TIBIAL ARTERY BYPASS;  Surgeon: Robins, Joshua E, MD;  Location: [facility];  Service: Vascular;  Laterality: Right;   INTRAOPERATIVE TRANSTHORACIC ECHOCARDIOGRAM N/A 01/11/2022   Procedure: INTRAOPERATIVE TRANSTHORACIC ECHOCARDIOGRAM;  Surgeon: Cooper, Michael, MD;  [facility]: [facility];  Service: Open Heart Surgery;  Laterality: N/A;   KNEE SURGERY     MULTIPLE EXTRACTIONS WITH ALVEOLOPLASTY N/A 12/09/2021   Procedure: MULTIPLE EXTRACTION;  Surgeon: Owsley, Madison B, DMD;  Location: [facility];  Service: Dentistry;  Laterality: N/A;   PERIPHERAL VASCULAR INTERVENTION Right 08/08/2019   Procedure: PERIPHERAL VASCULAR INTERVENTION;  Surgeon: Berry, Jonathan J, MD;  [facility];  Service: Cardiovascular;  Laterality: Right;   PERIPHERAL VASCULAR INTERVENTION Left 11/18/2019   Procedure: PERIPHERAL VASCULAR INTERVENTION;  Surgeon: Berry, Jonathan J, MD;  [facility];  Service: Cardiovascular;  Laterality: Left;  left popliteal artery   RIGHT/LEFT HEART CATH AND CORONARY ANGIOGRAPHY N/A 10/29/2021   Procedure: RIGHT/LEFT HEART CATH AND CORONARY ANGIOGRAPHY;  Surgeon: McAlhany, Christopher D, MD;  [facility];  Service: Cardiovascular;  Laterality: N/A;   TRANSCAROTID ARTERY REVASCULARIZATION  Left 12/16/2019   Procedure: TRANSCAROTID ARTERY REVASCULARIZATION;  Surgeon: Fields, Charles E, MD;  [facility];  Service: Vascular;  Laterality: Left;   TRANSCAROTID ARTERY REVASCULARIZATION  Right 02/03/2020   Procedure: RIGHT TRANSCAROTID   ARTERY REVASCULARIZATION;  Surgeon: Fields, Charles E, MD;  [facility];  Service: Vascular;  Laterality: Right;    TRANSCATHETER AORTIC VALVE REPLACEMENT, TRANSFEMORAL N/A 01/11/2022   Procedure: Transcatheter Aortic Valve Replacement, Transfemoral;  Surgeon: Cooper, Michael, MD;  [facility];  Service: Open Heart Surgery;  Laterality: N/A;   ULTRASOUND GUIDANCE FOR VASCULAR ACCESS Right 12/16/2019   Procedure: ULTRASOUND GUIDANCE FOR VASCULAR ACCESS;  Surgeon: Fields, Charles E, MD;  [facility];  Service: Vascular;  Laterality: Right;   ULTRASOUND GUIDANCE FOR VASCULAR ACCESS Right 02/03/2020   Procedure: ULTRASOUND GUIDANCE FOR VASCULAR ACCESS;  Surgeon: Fields, Charles E, MD;  [facility];  Service: Vascular;  Laterality: Right;   VASECTOMY     VEIN HARVEST Right 06/17/2022   Procedure: IPSILATERAL RIGHT GREATER SAPHENOUS VEIN HARVEST;  Surgeon: Robins, Joshua E, MD;  [facility]: [facility];  Service: Vascular;  Laterality: Right;   Social History   Socioeconomic History   Marital status: Widowed    Spouse name: Not on file   Number of children: 3   Years of education: Not on file   Highest education level: High school graduate  Occupational History   Occupation: Caregiver    Comment: Special needs    Comment: retired  Tobacco Use   Smoking status: Never   Smokeless tobacco: Never  Vaping Use   Vaping Use: Never used  Substance and Sexual Activity   Alcohol use: Yes    Comment: occasional   Drug use: No   Sexual activity: Not Currently  Other Topics Concern   Not on file  Social History Narrative   Widower.  3 daughters and 3 grandchildren.     Social Determinants of Health   Financial Resource Strain: Low Risk  (10/29/2021)   Overall Financial Resource Strain (CARDIA)    Difficulty of Paying Living Expenses: Not hard at all  Food Insecurity: No Food Insecurity (06/11/2022)   Hunger Vital Sign    Worried About Running Out of Food in the Last Year: Never true    Ran Out of Food in the Last Year: Never true  Transportation Needs: No Transportation Needs (06/11/2022)    PRAPARE - Transportation    Lack of Transportation (Medical): No    Lack of Transportation (Non-Medical): No  Physical Activity: Sufficiently Active (12/06/2020)   Exercise Vital Sign    Days of Exercise per Week: 4 days    Minutes of Exercise per Session: 60 min  Stress: No Stress Concern Present (12/06/2020)   [employer] - Occupational Stress Questionnaire    Feeling of Stress : Not at all  Social Connections: Moderately Isolated (12/06/2020)   Social Connection and Isolation Panel [NHANES]    Frequency of Communication with Friends and Family: More than three times a week    Frequency of Social Gatherings with Friends and Family: More than three times a week    Attends Religious Services: More than 4 times per year    Active Member of Clubs or Organizations: No    Attends Club or Organization Meetings: Never    Marital Status: Widowed   Family History  Problem Relation Age of Onset   CAD Father    Hypertension Father    Alcohol abuse Father        Cause of death   Diabetes Mother    Colon polyps Mother    CAD Brother 50       CABG   Colon cancer Neg Hx      Esophageal cancer Neg Hx    Rectal cancer Neg Hx    Stomach cancer Neg Hx    - negative except otherwise stated in the family history section Allergies  Allergen Reactions   Codeine Nausea And Vomiting   Norco [Hydrocodone-Acetaminophen] Nausea And Vomiting   Percocet [Oxycodone-Acetaminophen] Nausea And Vomiting   Tramadol Hcl Nausea And Vomiting   Prior to Admission medications   Medication Sig Start Date End Date Taking? Authorizing Provider  ARTIFICIAL TEARS PF 0.1-0.3 % SOLN Place 1 drop into both eyes every 8 (eight) hours as needed (for dryness).   Yes [provider]  aspirin EC 81 MG tablet Take 81 mg by mouth in the morning.   Yes [provider]  atorvastatin (LIPITOR) 40 MG tablet Take 40 mg by mouth in the morning. 07/03/20  Yes [provider]   carvedilol (COREG) 12.5 MG tablet Take 1 tablet (12.5 mg total) by mouth 2 (two) times daily with a meal. 10/30/21 12/28/23 Yes Arrien, Mauricio Daniel, MD  Cholecalciferol (VITAMIN D-3) 25 MCG (1000 UT) CAPS Take 1,000 Units by mouth daily.   Yes [provider]  clopidogrel (PLAVIX) 75 MG tablet Take 1 tablet (75 mg total) by mouth daily. 05/13/22  Yes Robins, Joshua E, MD  Continuous Blood Gluc Sensor (FREESTYLE LIBRE 14 DAY SENSOR) MISC Inject 1 Device into the skin every 14 (fourteen) days.   Yes [provider]  ferrous sulfate 325 (65 FE) MG tablet Take 325 mg by mouth daily with breakfast.   Yes [provider]  furosemide (LASIX) 40 MG tablet Take 40 mg by mouth daily at 4 PM.   Yes [provider]  furosemide (LASIX) 80 MG tablet Take 1 tablet (80 mg total) by mouth daily. Patient taking differently: Take 80 mg by mouth in the morning. 06/01/22  Yes Krishnan, Gokul, MD  gabapentin (NEURONTIN) 800 MG tablet Take 800 mg by mouth at bedtime as needed (Sleep). 07/03/20  Yes [provider]  hydrALAZINE (APRESOLINE) 25 MG tablet TAKE 1 TABLET BY MOUTH THREE TIMES A DAY 06/07/22  Yes Berry, Jonathan J, MD  insulin NPH-regular Human (70-30) 100 UNIT/ML injection Inject 7-8 Units into the skin See admin instructions. Inject 8 units into the skin before breakfast and 7 units before the evening meal   Yes [provider]  isosorbide mononitrate (IMDUR) 30 MG 24 hr tablet Take 1 tablet (30 mg total) by mouth daily. 01/13/22 01/13/23 Yes Thompson, Kathryn R, PA-C  NON FORMULARY Take 16 oz by mouth See admin instructions. [employer] Signature Organic Raw Kombucha, Ginger Lemonade- Drink 16 ounces by mouth once a day   Yes [provider]  polyethylene glycol (MIRALAX / GLYCOLAX) 17 g packet Take 17 g by mouth daily. Patient taking differently: Take 17 g by mouth daily as needed for mild constipation. 06/02/22  Yes Krishnan, Gokul, MD   Povidone-Iodine (BETADINE ANTISEPTIC EX) Apply 1 application  topically See admin instructions. Use as directed every other day for wound care (right heel site)   Yes [provider]  PRESCRIPTION MEDICATION Inject 0.1-1 mLs as directed See admin instructions. Tri-Mix Standard Strength 5 ml. Formula: Prostaglandin 10 mcg/ml, Papaverine 30 mg/ml, Phentolamine 1 mg/ml. Inject 0.1 ml into side of penis as directed as needed (to be injected immediately before sexual intercourse) may increase the dose by 0.1 ml every 48 hours to achieve an erection. Max dose 1 ml.   Yes [provider]  sacubitril-valsartan (ENTRESTO) 49-51 MG Take   1 tablet by mouth 2 (two) times daily. 03/07/22  Yes Sabharwal, Aditya, DO  TYLENOL 325 MG CAPS Take 650 mg by mouth every 6 (six) hours as needed (for pain).   Yes [provider]  vitamin B-12 (CYANOCOBALAMIN) 500 MCG tablet Take 500 mcg by mouth in the morning. 08/24/21  Yes [provider]  HYDROcodone-acetaminophen (NORCO/VICODIN) 5-325 MG tablet Take 1 tablet by mouth every 6 (six) hours as needed for moderate pain. Patient not taking: Reported on 06/07/2022 06/01/22 06/01/23  Krishnan, Gokul, MD   No results found. - pertinent xrays, CT, MRI studies were reviewed and independently interpreted  Positive ROS: All other systems have been reviewed and were otherwise negative with the exception of those mentioned in the HPI and as above.  Physical Exam: General: Alert, no acute distress Psychiatric: Patient is competent for consent with normal mood and affect Lymphatic: No axillary or cervical lymphadenopathy Cardiovascular: No pedal edema Respiratory: No cyanosis, no use of accessory musculature GI: No organomegaly, abdomen is soft and non-tender    Images:  @ENCIMAGES@  Labs:  Lab Results  Component Value Date   HGBA1C 7.9 (A) 04/25/2022   HGBA1C 8.2 (H) 01/07/2022   HGBA1C 8.0 (A) 09/29/2021   ESRSEDRATE 92 (H) 06/09/2022    CRP 14.2 (H) 06/09/2022   REPTSTATUS 06/13/2022 FINAL 06/08/2022   REPTSTATUS 06/13/2022 FINAL 06/08/2022   CULT  06/08/2022    NO GROWTH 5 DAYS Performed at [facility], [address] [place], [place] [address]    CULT  06/08/2022    NO GROWTH 5 DAYS Performed at [facility], [address] , [place] [address]    LABORGA SERRATIA MARCESCENS 08/12/2012    Lab Results  Component Value Date   ALBUMIN 3.3 (L) 05/31/2022   ALBUMIN 3.2 (L) 05/30/2022   ALBUMIN 3.1 (L) 05/29/2022        Latest Ref Rng & Units 06/20/2022    1:04 AM 06/18/2022    3:13 AM 06/17/2022   12:48 AM  CBC EXTENDED  WBC 4.0 - 10.5 K/uL 11.9  12.3  10.9   RBC 4.22 - 5.81 MIL/uL 3.66  4.02  4.22   Hemoglobin 13.0 - 17.0 g/dL 9.9  10.9  11.2   HCT 39.0 - 52.0 % 30.0  32.6  34.2   Platelets 150 - 400 K/uL 328  316  323     Neurologic: Patient does not have protective sensation bilateral lower extremities.   MUSCULOSKELETAL:   Skin: Examination patient has a dry gangrenous ulcer over the right calcaneus.  The area of hard dry gangrenous changes is 3 cm in diameter. Review of the MRI scan shows the heel ulcer with osteomyelitis involving the superficial aspect of the os calcis.  Assessment: Assessment: Diabetic insensate neuropathy with peripheral vascular disease status post revascularization with dry gangrenous ulcer right heel with osteomyelitis of the calcaneus.  Plan: Will plan for partial calcaneal excision and excision of the gangrenous ulcer.  Risk and benefits were discussed including wound healing complications need for additional surgery.  Patient states he understands wished to proceed at this time.  Will plan for surgery on Friday.  Thank you for the consult and the opportunity to see Mr. Neyens  Tomara Youngberg, MD [facility] [phone] 7:47 AM      

## 2022-06-21 NOTE — Progress Notes (Addendum)
  Progress Note    06/21/2022 7:47 AM 4 Days Post-Op  Subjective:  no complaints   Vitals:   06/21/22 0352 06/21/22 0456  BP: 92/64 92/63  Pulse: 67 70  Resp: 16 16  Temp: 98 F (36.7 C) 97.9 F (36.6 C)  SpO2: 96% 95%   Physical Exam: Lungs:  non labored Incisions:  R groin and RLE incisions c/d/i Extremities:  palpable bypass pulse Neurologic: A&O  CBC    Component Value Date/Time   WBC 11.9 (H) 06/20/2022 0104   RBC 3.66 (L) 06/20/2022 0104   HGB 9.9 (L) 06/20/2022 0104   HGB 14.6 03/09/2022 1233   HGB 13.3 11/15/2019 1139   HCT 30.0 (L) 06/20/2022 0104   HCT 40.2 11/15/2019 1139   PLT 328 06/20/2022 0104   PLT 216 03/09/2022 1233   PLT 300 11/15/2019 1139   MCV 82.0 06/20/2022 0104   MCV 86 11/15/2019 1139   MCH 27.0 06/20/2022 0104   MCHC 33.0 06/20/2022 0104   RDW 16.3 (H) 06/20/2022 0104   RDW 14.5 11/15/2019 1139   LYMPHSABS 1.7 06/16/2022 0057   LYMPHSABS 3.3 (H) 09/28/2017 1113   MONOABS 1.3 (H) 06/16/2022 0057   EOSABS 0.6 (H) 06/16/2022 0057   EOSABS 0.3 09/28/2017 1113   BASOSABS 0.0 06/16/2022 0057   BASOSABS 0.0 09/28/2017 1113    BMET    Component Value Date/Time   NA 134 (L) 06/20/2022 0104   NA 142 02/16/2022 1544   K 5.0 06/20/2022 0104   CL 100 06/20/2022 0104   CO2 27 06/20/2022 0104   GLUCOSE 134 (H) 06/20/2022 0104   BUN 27 (H) 06/20/2022 0104   BUN 22 02/16/2022 1544   CREATININE 1.45 (H) 06/20/2022 0104   CREATININE 1.36 (H) 03/09/2022 1233   CREATININE 0.86 06/06/2016 1455   CALCIUM 8.2 (L) 06/20/2022 0104   GFRNONAA 51 (L) 06/20/2022 0104   GFRNONAA 56 (L) 03/09/2022 1233   GFRNONAA 87 04/02/2013 1632   GFRAA >60 12/17/2019 0354   GFRAA >89 04/02/2013 1632    INR    Component Value Date/Time   INR 1.0 01/11/2022 0957     Intake/Output Summary (Last 24 hours) at 06/21/2022 0747 Last data filed at 06/21/2022 0641 Gross per 24 hour  Intake 925.67 ml  Output --  Net 925.67 ml     Assessment/Plan:  72 y.o.  male is s/p R CFA endarterectomy and femoral to ATA bypass with vein  4 Days Post-Op   RLE well perfused with palpable bypass pulse TED hose ordered to help manage edema OOB as tolerated IV abx per ID for osteomyelitis; [name] to evaluate heel for possible debridement   Dagoberto Ligas, PA-C [facility] [phone] 06/21/2022 7:47 AM  VASCULAR STAFF ADDENDUM: I have independently interviewed and examined the patient. I agree with the above.  Excellent signal in the foot.  OOB as tolerated.  Appreciate [name] help with musculoskeletal management of the heel. TED hose to the right leg Move to full dose anticoagulation Heparin gtt. This can be held for any procedures necessary.   Broadus John

## 2022-06-21 NOTE — Progress Notes (Signed)
Physical Therapy Treatment Patient Details Name: Caleb [name] MRN: [id number] DOB: 19-Jul-1950 Today's Date: 06/21/2022   History of Present Illness Pt is a 72 y.o. male admitted 3/19 with R calcaneal osteomyelitis. Pt underwent RLE angiogram 3/25. Tentative surgical plan for RLE bypass 3/29. PMH: IDDM, GERD, HTN, hypercholesterolemia, hyponatremia, chronic systolic CHF with ejection fraction 35 to 40%, h/o TAVR, PVD, neuropathy.    PT Comments    Pt reports he was having increased pain in his L groin, and is unsure about getting up, however once pt started to move he reported feeling better. Pt utilized Darco shoe for ambulation. Pt agreeable to door but able to progress to ambulation in hallway. Pt requires occasional cuing to make sure he maintained weightbearing only through the ball of his R foot, as well as with fatigue cues for proximity to RW. Pt agreeable to sitting up in recliner for his lunch. D/c plans remain appropriate at this time, however will PT will reassess after R heel surgery on Friday.      Recommendations for follow up therapy are one component of a multi-disciplinary discharge planning process, led by the attending physician.  Recommendations may be updated based on patient status, additional functional criteria and insurance authorization.     Assistance Recommended at Discharge Intermittent Supervision/Assistance  Patient can return home with the following A little help with bathing/dressing/bathroom;Assistance with cooking/housework;Assist for transportation;Help with stairs or ramp for entrance;A little help with walking and/or transfers   Equipment Recommendations  Rolling walker (2 wheels);BSC/3in1    Recommendations for Other Services       Precautions / Restrictions Precautions Precautions: Other (comment) Precaution Comments: R heel pain Restrictions Weight Bearing Restrictions: No     Mobility  Bed Mobility Overal bed mobility: Needs  Assistance Bed Mobility: Supine to Sit     Supine to sit: Supervision, HOB elevated     General bed mobility comments: +rail, increased time, no physical assist needed, min cues for scooting hips towards EoB    Transfers Overall transfer level: Needs assistance Equipment used: Rolling walker (2 wheels) Transfers: Sit to/from Stand Sit to Stand: Min guard           General transfer comment: min guard for safety, pt takes a minute to feel balanced due to chronic neuropathy.    Ambulation/Gait Ambulation/Gait assistance: Min guard Gait Distance (Feet): 50 Feet Assistive device: Rolling walker (2 wheels) Gait Pattern/deviations: Step-to pattern, Decreased stride length, Antalgic, Decreased weight shift to right, Decreased dorsiflexion - right Gait velocity: decreased Gait velocity interpretation: <1.31 ft/sec, indicative of household ambulator   General Gait Details: min guard for safety, utilized Darco shoe (heel offweighting shoe has not arrived from [employer]) and cues for weightbearing through ball of his foot, cues for proximity to RW to maximize use of triceps to offweight R LE   Stairs         General stair comments: single 7" platform step in room x5 reps, cues for step sequencing for reduced pain; to simulate curb step, pt reports no stairs to enter his home currently. BP stable pre/post.         Balance Overall balance assessment: Needs assistance Sitting-balance support: Feet supported, No upper extremity supported Sitting balance-Leahy Scale: Good     Standing balance support: Bilateral upper extremity supported, No upper extremity supported, During functional activity Standing balance-Leahy Scale: Fair Standing balance comment: RW to offload RLE during mobility  Cognition Arousal/Alertness: Awake/alert Behavior During Therapy: WFL for tasks assessed/performed Overall Cognitive Status: Within Functional Limits for  tasks assessed                                             General Comments General comments (skin integrity, edema, etc.): VSS on RA, pt able to maintain NWB through heel with weightbearing through ball of foot      Pertinent Vitals/Pain Pain Assessment Pain Assessment: Faces Faces Pain Scale: Hurts a little bit Pain Location: L groin Pain Descriptors / Indicators: Discomfort, Sharp, Tingling, Shooting Pain Intervention(s): Limited activity within patient's tolerance     PT Goals (current goals can now be found in the care plan section) Acute Rehab PT Goals Patient Stated Goal: home, independence PT Goal Formulation: With patient Time For Goal Achievement: 06/27/22 Progress towards PT goals: Progressing toward goals    Frequency    Min 1X/week      PT Plan Current plan remains appropriate       AM-PAC PT "6 Clicks" Mobility   Outcome Measure  Help needed turning from your back to your side while in a flat bed without using bedrails?: None Help needed moving from lying on your back to sitting on the side of a flat bed without using bedrails?: A Little Help needed moving to and from a bed to a chair (including a wheelchair)?: A Little Help needed standing up from a chair using your arms (e.g., wheelchair or bedside chair)?: A Little Help needed to walk in hospital room?: A Little Help needed climbing 3-5 steps with a railing? : A Little 6 Click Score: 19    End of Session Equipment Utilized During Treatment: Gait belt Activity Tolerance: Patient tolerated treatment well Patient left: with call bell/phone within reach;in chair (pt wanted to keep Darco shoe on so floated his R heel off pillow) Nurse Communication: Mobility status;Patient requests pain meds;Other (comment) (pt requesting heel offloading shoe for RLE) PT Visit Diagnosis: Difficulty in walking, not elsewhere classified (R26.2);Pain Pain - Right/Left: Right Pain - part of body: Ankle  and joints of foot     Time: [id number] PT Time Calculation (min) (ACUTE ONLY): 26 min  Charges:  $Gait Training: 8-22 mins $Therapeutic Activity: 8-22 mins                     Aura Bibby B. Migdalia Dk PT, DPT Acute Rehabilitation Services Please use secure chat or  Call Office [phone]    [facility] 06/21/2022, 2:53 PM

## 2022-06-21 NOTE — Progress Notes (Signed)
Patient has sleep apnea that last up to 12 seconds.

## 2022-06-21 NOTE — Consult Note (Signed)
ORTHOPAEDIC CONSULTATION  REQUESTING PHYSICIAN: Darliss Cheney, MD  Chief Complaint: Draining ulcer right heel.  HPI: Caleb [name] is a 72 y.o. male who presents with peripheral vascular disease and osteomyelitis of the right calcaneus with a dry gangrenous ulcer over the plantar aspect of the right calcaneus.  Patient has peripheral vascular disease, status post revascularization right lower extremity during this admission.  Past medical history positive for diabetes, peripheral vascular disease.  Past Medical History:  Diagnosis Date   Carotid artery occlusion    Diabetes mellitus without complication ([place])    Type II   Dyslipidemia 09/27/2012   Erectile dysfunction 09/27/2012   GERD (gastroesophageal reflux disease)    Hypercholesteremia    Hyperlipidemia 08/12/2012   Hypertension    Hyponatremia 08/14/2012   Neuropathy    Pancreatitis    Pancreatitis, acute 09/27/2012   Peripheral vascular disease ([place])    S/P 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 Burt Knack & Bartle   Severe aortic stenosis    Vitamin D deficiency 06/23/2015   Past Surgical History:  Procedure Laterality Date   ABDOMINAL AORTOGRAM W/LOWER EXTREMITY N/A 08/08/2019   Procedure: ABDOMINAL AORTOGRAM W/LOWER EXTREMITY;  Surgeon: Lorretta Harp, MD;  Location: [facility];  Service: Cardiovascular;  Laterality: N/A;   ABDOMINAL AORTOGRAM W/LOWER EXTREMITY N/A 11/18/2019   Procedure: ABDOMINAL AORTOGRAM W/LOWER EXTREMITY;  Surgeon: Lorretta Harp, MD;  Location: [place] [facility];  Service: Cardiovascular;  Laterality: N/A;   ABDOMINAL AORTOGRAM W/LOWER EXTREMITY N/A 06/13/2022   Procedure: ABDOMINAL AORTOGRAM W/LOWER EXTREMITY;  Surgeon: Angelia Mould, MD;  Location: [facility];  Service: Cardiovascular;  Laterality: N/A;   CARDIAC CATHETERIZATION     COLONOSCOPY  12 years ago   in [place] clinic=  normal exam per pt   ENDARTERECTOMY FEMORAL Right 06/17/2022   Procedure: SUPERFICIAL ENDARTERECTOMY OF COMMON FEMORAL ARTERY;  Surgeon: Broadus John, MD;  Location: [facility];  Service: Vascular;  Laterality: Right;   FEMORAL-TIBIAL BYPASS GRAFT Right 06/17/2022   Procedure: RIGHT FEMORAL- ANTERIOR TIBIAL ARTERY BYPASS;  Surgeon: Broadus John, MD;  Location: [place];  Service: Vascular;  Laterality: Right;   INTRAOPERATIVE TRANSTHORACIC ECHOCARDIOGRAM N/A 01/11/2022   Procedure: INTRAOPERATIVE TRANSTHORACIC ECHOCARDIOGRAM;  Surgeon: Sherren Mocha, MD;  Location: [facility];  Service: Open Heart Surgery;  Laterality: N/A;   KNEE SURGERY     MULTIPLE EXTRACTIONS WITH ALVEOLOPLASTY N/A 12/09/2021   Procedure: MULTIPLE EXTRACTION;  Surgeon: Charlaine Dalton, DMD;  Location: [facility];  Service: Dentistry;  Laterality: N/A;   PERIPHERAL VASCULAR INTERVENTION Right 08/08/2019   Procedure: PERIPHERAL VASCULAR INTERVENTION;  Surgeon: Lorretta Harp, MD;  Location: [facility];  Service: Cardiovascular;  Laterality: Right;   PERIPHERAL VASCULAR INTERVENTION Left 11/18/2019   Procedure: PERIPHERAL VASCULAR INTERVENTION;  Surgeon: Lorretta Harp, MD;  Location: [facility];  Service: Cardiovascular;  Laterality: Left;  left popliteal artery   RIGHT/LEFT HEART CATH AND CORONARY ANGIOGRAPHY N/A 10/29/2021   Procedure: RIGHT/LEFT HEART CATH AND CORONARY ANGIOGRAPHY;  Surgeon: Burnell Blanks, MD;  Location: [facility];  Service: Cardiovascular;  Laterality: N/A;   TRANSCAROTID ARTERY REVASCULARIZATION  Left 12/16/2019   Procedure: TRANSCAROTID ARTERY REVASCULARIZATION;  Surgeon: Elam Dutch, MD;  Location: [facility];  Service: Vascular;  Laterality: Left;   TRANSCAROTID ARTERY REVASCULARIZATION  Right 02/03/2020   Procedure: RIGHT TRANSCAROTID  ARTERY REVASCULARIZATION;  Surgeon: Elam Dutch, MD;  Location: [facility];  Service: Vascular;  Laterality: Right;    TRANSCATHETER AORTIC VALVE REPLACEMENT, TRANSFEMORAL N/A 01/11/2022   Procedure: Transcatheter Aortic Valve Replacement, Transfemoral;  Surgeon: Sherren Mocha, MD;  Location: [facility];  Service: Open Heart Surgery;  Laterality: N/A;   ULTRASOUND GUIDANCE FOR VASCULAR ACCESS Right 12/16/2019   Procedure: ULTRASOUND GUIDANCE FOR VASCULAR ACCESS;  Surgeon: Elam Dutch, MD;  Location: [place];  Service: Vascular;  Laterality: Right;   ULTRASOUND GUIDANCE FOR VASCULAR ACCESS Right 02/03/2020   Procedure: ULTRASOUND GUIDANCE FOR VASCULAR ACCESS;  Surgeon: Elam Dutch, MD;  Location: [place];  Service: Vascular;  Laterality: Right;   [place] Right 06/17/2022   Procedure: IPSILATERAL RIGHT GREATER SAPHENOUS VEIN HARVEST;  Surgeon: Broadus John, MD;  Location: [facility] OR;  Service: Vascular;  Laterality: Right;   Social History   Socioeconomic History   Marital status: Widowed    Spouse name: Not on file   Number of children: 3   Years of education: Not on file   Highest education level: High school graduate  Occupational History   Occupation: Caregiver    Comment: Special needs    Comment: retired  Tobacco Use   Smoking status: Never   Smokeless tobacco: Never  Vaping Use   Vaping Use: Never used  Substance and Sexual Activity   Alcohol use: Yes    Comment: occasional   Drug use: No   Sexual activity: Not Currently  Other Topics Concern   Not on file  Social History Narrative   Widower.  3 daughters and 3 grandchildren.     Social Determinants of Health   Financial Resource Strain: Low Risk  (10/29/2021)   Overall Financial Resource Strain (CARDIA)    Difficulty of Paying Living Expenses: Not hard at all  Food Insecurity: No Food Insecurity (06/11/2022)   Hunger Vital Sign    Worried About Running Out of Food in the Last Year: Never true    Ran Out of Food in the Last Year: Never true  Transportation Needs: No Transportation Needs (06/11/2022)    PRAPARE - Hydrologist (Medical): No    Lack of Transportation (Non-Medical): No  Physical Activity: Sufficiently Active (12/06/2020)   Exercise Vital Sign    Days of Exercise per Week: 4 days    Minutes of Exercise per Session: 60 min  Stress: No Stress Concern Present (12/06/2020)   [place]    Feeling of Stress : Not at all  Social Connections: Moderately Isolated (12/06/2020)   Social Connection and Isolation Panel [NHANES]    Frequency of Communication with Friends and Family: More than three times a week    Frequency of Social Gatherings with Friends and Family: More than three times a week    Attends Religious Services: More than 4 times per year    Active Member of [employer] or Organizations: No    Attends Archivist Meetings: Never    Marital Status: Widowed   Family History  Problem Relation Age of Onset   CAD Father    Hypertension Father    Alcohol abuse Father        Cause of death   Diabetes Mother    Colon polyps Mother    CAD Brother 19       CABG   Colon cancer Neg Hx  Esophageal cancer Neg Hx    Rectal cancer Neg Hx    Stomach cancer Neg Hx    - negative except otherwise stated in the family history section Allergies  Allergen Reactions   Codeine Nausea And Vomiting   Norco [Hydrocodone-Acetaminophen] Nausea And Vomiting   Percocet [Oxycodone-Acetaminophen] Nausea And Vomiting   Tramadol Hcl Nausea And Vomiting   Prior to Admission medications   Medication Sig Start Date End Date Taking? Authorizing Provider  ARTIFICIAL TEARS PF 0.1-0.3 % SOLN Place 1 drop into both eyes every 8 (eight) hours as needed (for dryness).   Yes [provider]  aspirin EC 81 MG tablet Take 81 mg by mouth in the morning.   Yes [provider]  atorvastatin (LIPITOR) 40 MG tablet Take 40 mg by mouth in the morning. 07/03/20  Yes [provider]   carvedilol (COREG) 12.5 MG tablet Take 1 tablet (12.5 mg total) by mouth 2 (two) times daily with a meal. 10/30/21 12/28/23 Yes Arrien, Jimmy Picket, MD  Cholecalciferol (VITAMIN D-3) 25 MCG (1000 UT) CAPS Take 1,000 Units by mouth daily.   Yes [provider]  clopidogrel (PLAVIX) 75 MG tablet Take 1 tablet (75 mg total) by mouth daily. 05/13/22  Yes Broadus John, MD  Continuous Blood Gluc Sensor (FREESTYLE LIBRE 14 DAY SENSOR) MISC Inject 1 Device into the skin every 14 (fourteen) days.   Yes [provider]  ferrous sulfate 325 (65 FE) MG tablet Take 325 mg by mouth daily with breakfast.   Yes [provider]  furosemide (LASIX) 40 MG tablet Take 40 mg by mouth daily at 4 PM.   Yes [provider]  furosemide (LASIX) 80 MG tablet Take 1 tablet (80 mg total) by mouth daily. Patient taking differently: Take 80 mg by mouth in the morning. 06/01/22  Yes Bonnielee Haff, MD  gabapentin (NEURONTIN) 800 MG tablet Take 800 mg by mouth at bedtime as needed (Sleep). 07/03/20  Yes [provider]  hydrALAZINE (APRESOLINE) 25 MG tablet TAKE 1 TABLET BY MOUTH THREE TIMES A DAY 06/07/22  Yes Lorretta Harp, MD  insulin NPH-regular Human (70-30) 100 UNIT/ML injection Inject 7-8 Units into the skin See admin instructions. Inject 8 units into the skin before breakfast and 7 units before the evening meal   Yes [provider]  isosorbide mononitrate (IMDUR) 30 MG 24 hr tablet Take 1 tablet (30 mg total) by mouth daily. 01/13/22 01/13/23 Yes Eileen Stanford, PA-C  NON FORMULARY Take 16 oz by mouth See admin instructions. [place] Signature Organic Raw Kombucha, Ginger Lemonade- Drink 16 ounces by mouth once a day   Yes [provider]  polyethylene glycol (MIRALAX / GLYCOLAX) 17 g packet Take 17 g by mouth daily. Patient taking differently: Take 17 g by mouth daily as needed for mild constipation. 06/02/22  Yes Bonnielee Haff, MD   Povidone-Iodine (BETADINE ANTISEPTIC EX) Apply 1 application  topically See admin instructions. Use as directed every other day for wound care (right heel site)   Yes [provider]  PRESCRIPTION MEDICATION Inject 0.1-1 mLs as directed See admin instructions. Tri-Mix Standard Strength 5 ml. Formula: Prostaglandin 10 mcg/ml, Papaverine 30 mg/ml, Phentolamine 1 mg/ml. Inject 0.1 ml into side of penis as directed as needed (to be injected immediately before sexual intercourse) may increase the dose by 0.1 ml every 48 hours to achieve an erection. Max dose 1 ml.   Yes [provider]  sacubitril-valsartan (ENTRESTO) 49-51 MG Take  1 tablet by mouth 2 (two) times daily. 03/07/22  Yes Sabharwal, Aditya, DO  TYLENOL 325 MG CAPS Take 650 mg by mouth every 6 (six) hours as needed (for pain).   Yes [provider]  vitamin B-12 (CYANOCOBALAMIN) 500 MCG tablet Take 500 mcg by mouth in the morning. 08/24/21  Yes [provider]  HYDROcodone-acetaminophen (NORCO/VICODIN) 5-325 MG tablet Take 1 tablet by mouth every 6 (six) hours as needed for moderate pain. Patient not taking: Reported on 06/07/2022 06/01/22 06/01/23  Bonnielee Haff, MD   No results found. - pertinent xrays, CT, MRI studies were reviewed and independently interpreted  Positive ROS: All other systems have been reviewed and were otherwise negative with the exception of those mentioned in the HPI and as above.  Physical Exam: General: Alert, no acute distress Psychiatric: Patient is competent for consent with normal mood and affect Lymphatic: No axillary or cervical lymphadenopathy Cardiovascular: No pedal edema Respiratory: No cyanosis, no use of accessory musculature GI: No organomegaly, abdomen is soft and non-tender    Images:  @ENCIMAGES @  Labs:  Lab Results  Component Value Date   HGBA1C 7.9 (A) 04/25/2022   HGBA1C 8.2 (H) 01/07/2022   HGBA1C 8.0 (A) 09/29/2021   ESRSEDRATE 92 (H) 06/09/2022    CRP 14.2 (H) 06/09/2022   REPTSTATUS 06/13/2022 FINAL 06/08/2022   REPTSTATUS 06/13/2022 FINAL 06/08/2022   CULT  06/08/2022    NO GROWTH 5 DAYS Performed at [facility], [place] [address] [place], [place] [address]    CULT  06/08/2022    NO GROWTH 5 DAYS Performed at [place] [address], [place], [place] [address]    LABORGA SERRATIA MARCESCENS 08/12/2012    Lab Results  Component Value Date   ALBUMIN 3.3 (L) 05/31/2022   ALBUMIN 3.2 (L) 05/30/2022   ALBUMIN 3.1 (L) 05/29/2022        Latest Ref Rng & Units 06/20/2022    1:04 AM 06/18/2022    3:13 AM 06/17/2022   12:48 AM  CBC EXTENDED  WBC 4.0 - 10.5 K/uL 11.9  12.3  10.9   RBC 4.22 - 5.81 MIL/uL 3.66  4.02  4.22   Hemoglobin 13.0 - 17.0 g/dL 9.9  10.9  11.2   HCT 39.0 - 52.0 % 30.0  32.6  34.2   Platelets 150 - 400 K/uL 328  316  323     Neurologic: Patient does not have protective sensation bilateral lower extremities.   MUSCULOSKELETAL:   Skin: Examination patient has a dry gangrenous ulcer over the right calcaneus.  The area of hard dry gangrenous changes is 3 cm in diameter. Review of the MRI scan shows the heel ulcer with osteomyelitis involving the superficial aspect of the os calcis.  Assessment: Assessment: Diabetic insensate neuropathy with peripheral vascular disease status post revascularization with dry gangrenous ulcer right heel with osteomyelitis of the calcaneus.  Plan: Will plan for partial calcaneal excision and excision of the gangrenous ulcer.  Risk and benefits were discussed including wound healing complications need for additional surgery.  Patient states he understands wished to proceed at this time.  Will plan for surgery on Friday.  Thank you for the consult and the opportunity to see Mr. Rochester Winkeler, [place] [phone] 7:47 AM

## 2022-06-22 DIAGNOSIS — M86171 Other acute osteomyelitis, right ankle and foot: Secondary | ICD-10-CM | POA: Diagnosis not present

## 2022-06-22 LAB — CBC
HCT: 29.8 % — ABNORMAL LOW (ref 39.0–52.0)
Hemoglobin: 9.6 g/dL — ABNORMAL LOW (ref 13.0–17.0)
MCH: 26.4 pg (ref 26.0–34.0)
MCHC: 32.2 g/dL (ref 30.0–36.0)
MCV: 81.9 fL (ref 80.0–100.0)
Platelets: 331 10*3/uL (ref 150–400)
RBC: 3.64 MIL/uL — ABNORMAL LOW (ref 4.22–5.81)
RDW: 16.2 % — ABNORMAL HIGH (ref 11.5–15.5)
WBC: 8.6 10*3/uL (ref 4.0–10.5)
nRBC: 0 % (ref 0.0–0.2)

## 2022-06-22 LAB — GLUCOSE, CAPILLARY
Glucose-Capillary: 141 mg/dL — ABNORMAL HIGH (ref 70–99)
Glucose-Capillary: 158 mg/dL — ABNORMAL HIGH (ref 70–99)
Glucose-Capillary: 120 mg/dL — ABNORMAL HIGH (ref 70–99)
Glucose-Capillary: 171 mg/dL — ABNORMAL HIGH (ref 70–99)

## 2022-06-22 LAB — HEPARIN LEVEL (UNFRACTIONATED)
Heparin Unfractionated: <0.1 IU/mL — ABNORMAL LOW (ref 0.30–0.70)
Heparin Unfractionated: <0.1 IU/mL — ABNORMAL LOW (ref 0.30–0.70)

## 2022-06-22 LAB — BASIC METABOLIC PANEL
Anion gap: 9 (ref 5–15)
BUN: 32 mg/dL — ABNORMAL HIGH (ref 8–23)
CO2: 25 mmol/L (ref 22–32)
Calcium: 8.3 mg/dL — ABNORMAL LOW (ref 8.9–10.3)
Chloride: 98 mmol/L (ref 98–111)
Creatinine, Ser: 1.49 mg/dL — ABNORMAL HIGH (ref 0.61–1.24)
GFR, Estimated: 50 mL/min — ABNORMAL LOW (ref >60)
Glucose, Bld: 136 mg/dL — ABNORMAL HIGH (ref 70–99)
Potassium: 4.6 mmol/L (ref 3.5–5.1)
Sodium: 132 mmol/L — ABNORMAL LOW (ref 135–145)

## 2022-06-22 MED ORDER — INSULIN ASPART PROT & ASPART (70-30 MIX) 100 UNIT/ML ~~LOC~~ SUSP
9.0000 [IU] | Freq: Two times a day (BID) | SUBCUTANEOUS | Status: AC
  Administered 2022-06-22 – 2022-06-23 (×2): 9 [IU] via SUBCUTANEOUS

## 2022-06-22 MED ORDER — INSULIN DETEMIR 100 UNIT/ML ~~LOC~~ SOLN
5.0000 [IU] | Freq: Two times a day (BID) | SUBCUTANEOUS | Status: DC
  Administered 2022-06-24 – 2022-06-30 (×13): 5 [IU] via SUBCUTANEOUS
  Filled 2022-06-22 (×16): qty 0.05

## 2022-06-22 NOTE — Progress Notes (Signed)
PROGRESS NOTE    WAH DILDINE  [id number] DOB: 02-Oct-1950 DOA: 06/07/2022 PCP: [name], MD  Chief Complaint  Patient presents with   Wound Check   Foot Pain    Brief Narrative:   Caleb [name] is Caleb [name] 72 year old man with PVD, DM 2, HTN, AAS s/p TAVR, HFrEF with Wrigley [place] EF of 35% send CKD 3 was admitted with recurrent diabetic foot infection and acute on chronic kidney injury. Workup has revealed acute osteomyelitis in the posterolateral aspect of the calcaneus. Seen by ID and per their recommendations, he is on doxycycline/Augmentin pending surgery/bone biopsy per ID recommendations, for which he is potentially scheduled on 06/22/2022 with [name]. Patient is being seen by vascular surgery , Status post angiogram 3/25, with right femoral-tibial bypass graft. Did well postop.   Assessment & Plan:   Principal Problem:   Acute osteomyelitis of right calcaneus Active Problems:   Type 2 diabetes mellitus with hyperlipidemia   Acute kidney injury superimposed on chronic kidney disease   HTN (hypertension)   Hyperlipidemia   Hyponatremia   Dyslipidemia   PAD (peripheral artery disease)   S/P TAVR (transcatheter aortic valve replacement)   Decubitus ulcer of right heel   Diabetic foot ulcer   Gastroenteritis  Sepsis secondary to acute osteomyelitis right heel and PVD  -Sepsis POA  -Appreciate infectious disease -Antibiotics per ID, continue doxycycline, Augmentin -Aspirin, Plavix, lipitor  -Status post angiogram 3/25.  Status post femoral-tibial bypass.  Per [name] note, he is scheduled for surgical debridement on Friday.  Please send tissue for culture.   AKI on CKD stage IIIb, resolved -Baseline Cr 1.5-2.5 -Appreciate nephrology -Thought to be secondary to hypovolemia while on Entresto.  [name] was resumed on 06/13/2022.  He is getting intermittent IV Lasix.  Scheduled oral Lasix resumed on 06/20/2022. -Nephrology signed off 3/22.  Follow-up with [name] later this  month -Improving   Hyperkalemia -Resolved   Diabetes mellitus type 2 with hyperglycemia -Blood sugar controlled, currently on NovoLog Mix 70/30 9 units twice daily as well as SSI.  With plan for surgery Friday, transition to levemir 5 units BID Thursday PM.    DVT prophylaxis: heparin gtt Code Status: full Family Communication: none Disposition:   Status is: Inpatient Remains inpatient appropriate because: pending ortho procedure    Consultants:  Vascular ID ortho  Procedures:   3/25 Conscious sedation Ultrasound-guided access to the left common femoral artery CO2 aortogram and iliac arteriogram Selective catheterization of the right external iliac artery (second-order catheterization) with right lower extremity runoff Mynx closure of the left common femoral artery   Antimicrobials:  Anti-infectives (From admission, onward)    Start     Dose/Rate Route Frequency Ordered Stop   06/21/22 1000  amoxicillin-clavulanate (AUGMENTIN) 875-125 MG per tablet 1 tablet        1 tablet Oral Every 12 hours 06/21/22 0657     06/17/22 0740  ceFAZolin (ANCEF) 2-4 GM/100ML-% IVPB       Note to Pharmacy: Bobbie Stack M: cabinet override      06/17/22 0740 06/17/22 1944   06/16/22 1800  doxycycline (VIBRA-TABS) tablet 100 mg        100 mg Oral 2 times daily after meals 06/16/22 1600     06/09/22 1245  doxycycline (VIBRA-TABS) tablet 100 mg  Status:  Discontinued        100 mg Oral Every 12 hours 06/09/22 1145 06/16/22 1600   06/09/22 1245  amoxicillin-clavulanate (AUGMENTIN) 500-125 MG per tablet  1 tablet  Status:  Discontinued        1 tablet Oral 2 times daily 06/09/22 1145 06/21/22 0657   06/08/22 1600  cefTRIAXone (ROCEPHIN) 2 g in sodium chloride 0.9 % 100 mL IVPB  Status:  Discontinued        2 g 200 mL/hr over 30 Minutes Intravenous Every 24 hours 06/08/22 0852 06/09/22 1145   06/08/22 1200  DAPTOmycin (CUBICIN) 600 mg in sodium chloride 0.9 % IVPB  Status:  Discontinued         8 mg/kg  77.4 kg 124 mL/hr over 30 Minutes Intravenous Every 48 hours 06/08/22 1012 06/09/22 1145   06/07/22 1415  linezolid (ZYVOX) IVPB 600 mg  Status:  Discontinued        600 mg 300 mL/hr over 60 Minutes Intravenous Every 12 hours 06/07/22 1414 06/08/22 0852   06/07/22 1415  ceFEPIme (MAXIPIME) 2 g in sodium chloride 0.9 % 100 mL IVPB  Status:  Discontinued        2 g 200 mL/hr over 30 Minutes Intravenous Daily 06/07/22 1414 06/08/22 0852       Subjective: Feels well, no new complaints  Objective: Vitals:   06/22/22 0310 06/22/22 0715 06/22/22 1145 06/22/22 1338  BP: 116/81 110/66 (!) 82/50 104/69  Pulse: 93 74 85 79  Resp: 17 16 13  (!) 9  Temp: 98 F (36.7 C) 98 F (36.7 C) 97.8 F (36.6 C) 98.1 F (36.7 C)  TempSrc: Oral Oral Oral Oral  SpO2: 99% 96% 98% 94%  Weight: 88.5 kg     Height:        Intake/Output Summary (Last 24 hours) at 06/22/2022 1656 Last data filed at 06/22/2022 1012 Gross per 24 hour  Intake --  Output 1200 ml  Net -1200 ml   Filed Weights   06/20/22 0315 06/21/22 0636 06/22/22 0310  Weight: 86.4 kg 85.8 kg 88.5 kg    Examination:  General exam: Appears calm and comfortable  Respiratory system: unlabored Cardiovascular system: RRR Gastrointestinal system: Abdomen is nondistended, soft and nontender.  Central nervous system: Alert and oriented. No focal neurological deficits. Extremities: no LEE    Data Reviewed: I have personally reviewed following labs and imaging studies  CBC: Recent Labs  Lab 06/16/22 0057 06/17/22 0048 06/18/22 0313 06/20/22 0104 06/22/22 0613  WBC 11.8* 10.9* 12.3* 11.9* 8.6  NEUTROABS 8.1*  --   --   --   --   HGB 11.6* 11.2* 10.9* 9.9* 9.6*  HCT 36.4* 34.2* 32.6* 30.0* 29.8*  MCV 82.5 81.0 81.1 82.0 81.9  PLT 348 323 316 328 [id number]    Basic Metabolic Panel: Recent Labs  Lab 06/16/22 0057 06/17/22 0048 06/18/22 0313 06/20/22 0104 06/22/22 0613  NA 132* 133* 131* 134* 132*  K 4.0 3.9 4.6 5.0  4.6  CL 96* 99 98 100 98  CO2 27 25 24 27 25   GLUCOSE 124* 106* 148* 134* 136*  BUN 20 15 19  27* 32*  CREATININE 1.35* 1.19 1.36* 1.45* 1.49*  CALCIUM 8.5* 8.2* 8.2* 8.2* 8.3*    GFR: Estimated Creatinine Clearance: 47.6 mL/min (Mayleigh Tetrault) (by C-G formula based on SCr of 1.49 mg/dL (H)).  Liver Function Tests: No results for input(s): "AST", "ALT", "ALKPHOS", "BILITOT", "PROT", "ALBUMIN" in the last 168 hours.  CBG: Recent Labs  Lab 06/21/22 1117 06/21/22 1752 06/21/22 2055 06/22/22 0614 06/22/22 1144  GLUCAP 154* 156* 181* 141* 158*     Recent Results (from the past 240 hour(s))  Surgical  pcr screen     Status: None   Collection Time: 06/16/22  9:23 PM   Specimen: Nasal Mucosa; Nasal Swab  Result Value Ref Range Status   MRSA, PCR NEGATIVE NEGATIVE Final   Staphylococcus aureus NEGATIVE NEGATIVE Final    Comment: (NOTE) The Xpert SA Assay (FDA approved for NASAL specimens in patients 36 years of age and older), is one component of [place] comprehensive surveillance program. It is not intended to diagnose infection nor to guide or monitor treatment. Performed at [facility], [place] [address] [place], [place] [address]          Radiology Studies: No results found.      Scheduled Meds:  amoxicillin-clavulanate  1 tablet Oral Q12H   aspirin  81 mg Oral Daily   atorvastatin  40 mg Oral Daily   carvedilol  12.5 mg Oral BID WC   Chlorhexidine Gluconate Cloth  6 each Topical Daily   cholecalciferol  1,000 Units Oral Daily   cyanocobalamin  500 mcg Oral q AM   doxycycline  100 mg Oral BID PC   ferrous sulfate  325 mg Oral Q breakfast   furosemide  40 mg Oral Daily   hydrALAZINE  25 mg Oral TID   insulin aspart  0-15 Units Subcutaneous TID WC   insulin aspart  0-5 Units Subcutaneous QHS   insulin aspart protamine- aspart  9 Units Subcutaneous BID WC   isosorbide mononitrate  30 mg Oral Daily   pantoprazole  40 mg Oral Daily   sacubitril-valsartan  1 tablet Oral  BID   Continuous Infusions:  sodium chloride     heparin 950 Units/hr (06/22/22 1008)   magnesium sulfate bolus IVPB       LOS: 15 days    Time spent: over 30 min    Fayrene Helper, MD [facility]   To contact the attending provider between 7A-7P or the covering provider during after hours 7P-7A, please log into the web site [email or web address] and access using universal [place] password for that web site. If you do not have the password, please call the hospital operator.  06/22/2022, 4:56 PM

## 2022-06-22 NOTE — Progress Notes (Signed)
ANTICOAGULATION CONSULT NOTE - Initial Consult  Pharmacy Consult for heparin  Indication: critical limb ischemia   Allergies  Allergen Reactions   Codeine Nausea And Vomiting   Norco [Hydrocodone-Acetaminophen] Nausea And Vomiting   Percocet [Oxycodone-Acetaminophen] Nausea And Vomiting   Tramadol Hcl Nausea And Vomiting    Patient Measurements: Height: 5\' 7"  (170.2 cm) Weight: 88.5 kg (195 lb 1.6 oz) IBW/kg (Calculated) : 66.1     Vital Signs: Temp: 97.9 F (36.6 C) (04/03 1657) Temp Source: Oral (04/03 1657) BP: 118/82 (04/03 1657) Pulse Rate: 92 (04/03 1657)  Labs: Recent Labs    06/20/22 0104 06/21/22 0358 06/22/22 0130 06/22/22 0613 06/22/22 1729  HGB 9.9*  --   --  9.6*  --   HCT 30.0*  --   --  29.8*  --   PLT 328  --   --  331  --   HEPARINUNFRC <0.10* <0.10* <0.10*  --  <0.10*  CREATININE 1.45*  --   --  1.49*  --      Estimated Creatinine Clearance: 47.6 mL/min (A) (by C-G formula based on SCr of 1.49 mg/dL (H)).   Medical History: Past Medical History:  Diagnosis Date   Carotid artery occlusion    Diabetes mellitus without complication ([place])    Type II   Dyslipidemia 09/27/2012   Erectile dysfunction 09/27/2012   GERD (gastroesophageal reflux disease)    Hypercholesteremia    Hyperlipidemia 08/12/2012   Hypertension    Hyponatremia 08/14/2012   Neuropathy    Pancreatitis    Pancreatitis, acute 09/27/2012   Peripheral vascular disease ([place])    S/P 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 Burt Knack & Bartle   Severe aortic stenosis    Vitamin D deficiency 06/23/2015      Assessment: 69 you male with critical limb ischemia s/p R CFA endarterectomy with femoral to ATA bypass on 3/30 and has been on low dose heparin. Pharmacy consulted to change to full dose. Plans noted for R heel debridement on Friday (R heel osteo).  -current heparin rate= 500 units/hr -hg=  9.6  Heparin level came back undetectable again after rate increase. No bleeding issue per RN. We will increase dose and check in AM.  Goal of Therapy:  Heparin level 0.3-0.7 units/ml Monitor platelets by anticoagulation protocol: Yes   Plan:  -Increase heparin to 1150 units/hr -Heparin level in 8 hours and daily wth CBC daily  Onnie Boer, PharmD, [place], AAHIVP, CPP Infectious Disease Pharmacist 06/22/2022 7:31 PM

## 2022-06-22 NOTE — Progress Notes (Signed)
Mobility Specialist: Progress Note   06/22/22 1600  Mobility  Activity Ambulated with assistance to bathroom  Level of Assistance Contact guard assist, steadying assist  Assistive Device Front wheel walker  Distance Ambulated (ft) 40 ft (20'x2)  Activity Response Tolerated well  Mobility Referral Yes  $Mobility charge 1 Mobility   Post-Mobility: 87 HR  Pt received in the bed and requesting to use the BR. Mod I with bed mobility and contact guard during ambulation. BM successful. Pt back to bed after BR per request with c/o nausea, RN notified. Pt has call bell and phone at his side.   [place] Shanin Szymanowski Mobility Specialist Please contact via SecureChat or Rehab office at [phone]

## 2022-06-22 NOTE — Progress Notes (Addendum)
  Progress Note    06/22/2022 7:37 AM 5 Days Post-Op  Subjective:  no complaints.  Walked the halls yesterday with heel offloading shoe   Vitals:   06/22/22 0310 06/22/22 0715  BP: 116/81 110/66  Pulse: 93 74  Resp: 17 16  Temp: 98 F (36.7 C) 98 F (36.7 C)  SpO2: 99% 96%   Physical Exam: Lungs:  non labored Incisions:  incisions of RLE healing well Extremities:  brisk R DP by doppler; palpable bypass pulse lateral to the knee Neurologic: A&O  CBC    Component Value Date/Time   WBC 8.6 06/22/2022 0613   RBC 3.64 (L) 06/22/2022 0613   HGB 9.6 (L) 06/22/2022 0613   HGB 14.6 03/09/2022 1233   HGB 13.3 11/15/2019 1139   HCT 29.8 (L) 06/22/2022 0613   HCT 40.2 11/15/2019 1139   PLT 331 06/22/2022 0613   PLT 216 03/09/2022 1233   PLT 300 11/15/2019 1139   MCV 81.9 06/22/2022 0613   MCV 86 11/15/2019 1139   MCH 26.4 06/22/2022 0613   MCHC 32.2 06/22/2022 0613   RDW 16.2 (H) 06/22/2022 0613   RDW 14.5 11/15/2019 1139   LYMPHSABS 1.7 06/16/2022 0057   LYMPHSABS 3.3 (H) 09/28/2017 1113   MONOABS 1.3 (H) 06/16/2022 0057   EOSABS 0.6 (H) 06/16/2022 0057   EOSABS 0.3 09/28/2017 1113   BASOSABS 0.0 06/16/2022 0057   BASOSABS 0.0 09/28/2017 1113    BMET    Component Value Date/Time   NA 132 (L) 06/22/2022 0613   NA 142 02/16/2022 1544   K 4.6 06/22/2022 0613   CL 98 06/22/2022 0613   CO2 25 06/22/2022 0613   GLUCOSE 136 (H) 06/22/2022 0613   BUN 32 (H) 06/22/2022 0613   BUN 22 02/16/2022 1544   CREATININE 1.49 (H) 06/22/2022 0613   CREATININE 1.36 (H) 03/09/2022 1233   CREATININE 0.86 06/06/2016 1455   CALCIUM 8.3 (L) 06/22/2022 0613   GFRNONAA 50 (L) 06/22/2022 0613   GFRNONAA 56 (L) 03/09/2022 1233   GFRNONAA 87 04/02/2013 1632   GFRAA >60 12/17/2019 0354   GFRAA >89 04/02/2013 1632    INR    Component Value Date/Time   INR 1.0 01/11/2022 0957     Intake/Output Summary (Last 24 hours) at 06/22/2022 0737 Last data filed at 06/22/2022 0315 Gross per  24 hour  Intake 240 ml  Output 700 ml  Net -460 ml     Assessment/Plan:  72 y.o. male is s/p R CFA endarterectomy with femoral to ATA bypass with vein 5 Days Post-Op   R foot well perfused by doppler; palpable bypass pulse Increase heparin to therapeutic dose [name] to debride R heel on Friday; continue abx per ID OOB with heel offloading shoe   Dagoberto Ligas, PA-C [facility] [phone] 06/22/2022 7:37 AM  VASCULAR STAFF ADDENDUM: I have independently interviewed and examined the patient. I agree with the above.  TED hose Signal excellent, working to manage edema  J. Melene Muller, MD [facility] Phone Number: [phone]

## 2022-06-22 NOTE — Progress Notes (Signed)
ANTICOAGULATION CONSULT NOTE - Initial Consult  Pharmacy Consult for heparin  Indication: critical limb ischemia   Allergies  Allergen Reactions   Codeine Nausea And Vomiting   Norco [Hydrocodone-Acetaminophen] Nausea And Vomiting   Percocet [Oxycodone-Acetaminophen] Nausea And Vomiting   Tramadol Hcl Nausea And Vomiting    Patient Measurements: Height: 5\' 7"  (170.2 cm) Weight: 88.5 kg (195 lb 1.6 oz) IBW/kg (Calculated) : 66.1     Vital Signs: Temp: 98 F (36.7 C) (04/03 0715) Temp Source: Oral (04/03 0715) BP: 110/66 (04/03 0715) Pulse Rate: 74 (04/03 0715)  Labs: Recent Labs    06/20/22 0104 06/21/22 0358 06/22/22 0130 06/22/22 0613  HGB 9.9*  --   --  9.6*  HCT 30.0*  --   --  29.8*  PLT 328  --   --  331  HEPARINUNFRC <0.10* <0.10* <0.10*  --   CREATININE 1.45*  --   --  1.49*    Estimated Creatinine Clearance: 47.6 mL/min (A) (by C-G formula based on SCr of 1.49 mg/dL (H)).   Medical History: Past Medical History:  Diagnosis Date   Carotid artery occlusion    Diabetes mellitus without complication ([place])    Type II   Dyslipidemia 09/27/2012   Erectile dysfunction 09/27/2012   GERD (gastroesophageal reflux disease)    Hypercholesteremia    Hyperlipidemia 08/12/2012   Hypertension    Hyponatremia 08/14/2012   Neuropathy    Pancreatitis    Pancreatitis, acute 09/27/2012   Peripheral vascular disease ([place])    S/P 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 Burt [employer] & Bartle   Severe aortic stenosis    Vitamin D deficiency 06/23/2015      Assessment: 83 you male with critical limb ischemia s/p R CFA endarterectomy with femoral to ATA bypass on 3/30 and has been on low dose heparin. Pharmacy consulted to change to full dose. Plans noted for R heel debridement on Friday (R heel osteo).  -current heparin rate= 500 units/hr -hg= 9.6  Goal of Therapy:  Heparin level 0.3-0.7  units/ml Monitor platelets by anticoagulation protocol: Yes   Plan:  -Increase heparin to 950 units/hr -Heparin level in 8 hours and daily wth CBC daily  Hildred Laser, PharmD Clinical Pharmacist **Pharmacist phone directory can now be found on [email or web address] ([email or web address]).  Listed under [place].

## 2022-06-23 ENCOUNTER — Inpatient Hospital Stay (HOSPITAL_COMMUNITY): Payer: Medicare HMO

## 2022-06-23 DIAGNOSIS — M86171 Other acute osteomyelitis, right ankle and foot: Secondary | ICD-10-CM | POA: Diagnosis not present

## 2022-06-23 LAB — BASIC METABOLIC PANEL
Anion gap: 4 — ABNORMAL LOW (ref 5–15)
BUN: 33 mg/dL — ABNORMAL HIGH (ref 8–23)
CO2: 27 mmol/L (ref 22–32)
Calcium: 8.4 mg/dL — ABNORMAL LOW (ref 8.9–10.3)
Chloride: 102 mmol/L (ref 98–111)
Creatinine, Ser: 1.51 mg/dL — ABNORMAL HIGH (ref 0.61–1.24)
GFR, Estimated: 49 mL/min — ABNORMAL LOW (ref >60)
Glucose, Bld: 88 mg/dL (ref 70–99)
Potassium: 5.1 mmol/L (ref 3.5–5.1)
Sodium: 133 mmol/L — ABNORMAL LOW (ref 135–145)

## 2022-06-23 LAB — HEPARIN LEVEL (UNFRACTIONATED)
Heparin Unfractionated: 0.14 IU/mL — ABNORMAL LOW (ref 0.30–0.70)
Heparin Unfractionated: <0.1 IU/mL — ABNORMAL LOW (ref 0.30–0.70)

## 2022-06-23 LAB — GLUCOSE, CAPILLARY
Glucose-Capillary: 81 mg/dL (ref 70–99)
Glucose-Capillary: 141 mg/dL — ABNORMAL HIGH (ref 70–99)
Glucose-Capillary: 140 mg/dL — ABNORMAL HIGH (ref 70–99)
Glucose-Capillary: 106 mg/dL — ABNORMAL HIGH (ref 70–99)

## 2022-06-23 LAB — CBC
HCT: 29.6 % — ABNORMAL LOW (ref 39.0–52.0)
Hemoglobin: 9.4 g/dL — ABNORMAL LOW (ref 13.0–17.0)
MCH: 26.3 pg (ref 26.0–34.0)
MCHC: 31.8 g/dL (ref 30.0–36.0)
MCV: 82.9 fL (ref 80.0–100.0)
Platelets: 308 10*3/uL (ref 150–400)
RBC: 3.57 MIL/uL — ABNORMAL LOW (ref 4.22–5.81)
RDW: 16.4 % — ABNORMAL HIGH (ref 11.5–15.5)
WBC: 7.5 10*3/uL (ref 4.0–10.5)
nRBC: 0 % (ref 0.0–0.2)

## 2022-06-23 MED ORDER — POVIDONE-IODINE 10 % EX SWAB
2.0000 | Freq: Once | CUTANEOUS | Status: AC
  Administered 2022-06-24: 2 via TOPICAL

## 2022-06-23 MED ORDER — FUROSEMIDE 40 MG PO TABS
80.0000 mg | ORAL_TABLET | Freq: Every day | ORAL | Status: DC
  Administered 2022-06-23 – 2022-06-25 (×3): 80 mg via ORAL
  Filled 2022-06-23 (×3): qty 2

## 2022-06-23 MED ORDER — CHLORHEXIDINE GLUCONATE 4 % EX LIQD
60.0000 mL | Freq: Once | CUTANEOUS | Status: AC
  Administered 2022-06-24: 4 via TOPICAL
  Filled 2022-06-23: qty 60

## 2022-06-23 MED ORDER — CEFAZOLIN SODIUM-DEXTROSE 2-4 GM/100ML-% IV SOLN
2.0000 g | INTRAVENOUS | Status: AC
  Administered 2022-06-24: 2 g via INTRAVENOUS
  Filled 2022-06-23: qty 100

## 2022-06-23 NOTE — Progress Notes (Signed)
[facility] for heparin  Indication: critical limb ischemia   Allergies  Allergen Reactions   Codeine Nausea And Vomiting   Norco [Hydrocodone-Acetaminophen] Nausea And Vomiting   Percocet [Oxycodone-Acetaminophen] Nausea And Vomiting   Tramadol Hcl Nausea And Vomiting    Patient Measurements: Height: 5\' 7"  (170.2 cm) Weight: 88 kg (194 lb 1.6 oz) IBW/kg (Calculated) : 66.1     Vital Signs: Temp: 99.5 F (37.5 C) (04/04 1734) Temp Source: Oral (04/04 1734) BP: 123/86 (04/04 1734) Pulse Rate: 90 (04/04 1734)  Labs: Recent Labs    06/22/22 0613 06/22/22 1729 06/23/22 0521 06/23/22 1756  HGB 9.6*  --  9.4*  --   HCT 29.8*  --  29.6*  --   PLT 331  --  308  --   HEPARINUNFRC  --  <0.10* 0.14* <0.10*  CREATININE 1.49*  --  1.51*  --      Estimated Creatinine Clearance: 46.8 mL/min (A) (by C-G formula based on SCr of 1.51 mg/dL (H)).   Medical History: Past Medical History:  Diagnosis Date   Carotid artery occlusion    Diabetes mellitus without complication ([place])    Type II   Dyslipidemia 09/27/2012   Erectile dysfunction 09/27/2012   GERD (gastroesophageal reflux disease)    Hypercholesteremia    Hyperlipidemia 08/12/2012   Hypertension    Hyponatremia 08/14/2012   Neuropathy    Pancreatitis    Pancreatitis, acute 09/27/2012   Peripheral vascular disease ([place])    S/P 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 Burt Knack & Bartle   Severe aortic stenosis    Vitamin D deficiency 06/23/2015      Assessment: 54 you male with critical limb ischemia s/p R CFA endarterectomy with femoral to ATA bypass on 3/30 and has been on low dose heparin. Pharmacy consulted to change to full dose. Plans noted for R heel debridement on Friday (R heel osteo).  -current heparin rate= 500 units/hr -hg= 9.6  Heparin level continues to be subtherapeutic. Plan for  I&D in AM. We will increase rate and check level in AM.   Goal of Therapy:  Heparin level 0.3-0.7 units/ml Monitor platelets by anticoagulation protocol: Yes  Plan: Increase heparin infusion to 1600 units/hr Check heparin level in AM Monitor for signs/symptoms of bleeding  Onnie Boer, PharmD, BCIDP, AAHIVP, CPP Infectious Disease Pharmacist 06/23/2022 7:05 PM

## 2022-06-23 NOTE — Progress Notes (Signed)
Mobility Specialist: Progress Note   06/23/22 1036  Mobility  Activity Ambulated with assistance to bathroom  Level of Assistance Contact guard assist, steadying assist  Assistive Device Front wheel walker  Distance Ambulated (ft) 40 ft (20'x2)  Activity Response Tolerated well  Mobility Referral Yes  $Mobility charge 1 Mobility   Post-Mobility: 59 HR, 100% SpO2  Pt received in the bed and agreeable to mobility. Assisted to the BR per request, BM successful. Pt declining further ambulation at this time. Encouraged pt to work with PT/OT later today. Pt assisted back to bed after session with call bell and phone at his side.   [place] Emonii Wienke Mobility Specialist Please contact via SecureChat or Rehab office at [phone]

## 2022-06-23 NOTE — Progress Notes (Signed)
PT Cancellation Note  Patient Details Name: Caleb [name] MRN: [id number] DOB: 04/19/1950   Cancelled Treatment:    Reason Eval/Treat Not Completed: (P) Other (comment) (pt reports some drainage from proximal portion of R thigh incision, RN called to room to address. Pt defers PT at this time.) Will continue efforts per PT plan of care as schedule permits.   Jalisha Enneking M Guerin Lashomb 06/23/2022, 6:08 PM

## 2022-06-23 NOTE — Progress Notes (Signed)
Occupational Therapy Treatment Patient Details Name: Caleb [name] MRN: [id number] DOB: 11/18/1950 Today's Date: 06/23/2022   History of present illness Pt is a 72 y.o. male admitted 3/19 with R calcaneal osteomyelitis. Pt underwent RLE angiogram 3/25. Tentative surgical plan for RLE bypass 3/29. PMH: IDDM, GERD, HTN, hypercholesterolemia, hyponatremia, chronic systolic CHF with ejection fraction 35 to 40%, h/o TAVR, PVD, neuropathy.   OT comments  Pt continuing to progress towards patient focused goals. Pt continuing to progress with pain tolerance and was set up for bathing sitting at sink in today's OT session. Pt complete grooming/hygiene routine and dressing while seated at sink, reported no pain at the moment but did request pain medication from RN at end of OT session. OT continuing to recommend Home OT services but will determine need for further care and assistance pending functional status post surgery.   Recommendations for follow up therapy are one component of a multi-disciplinary discharge planning process, led by the attending physician.  Recommendations may be updated based on patient status, additional functional criteria and insurance authorization.    Assistance Recommended at Discharge PRN  Patient can return home with the following  A little help with walking and/or transfers;A little help with bathing/dressing/bathroom;Assistance with cooking/housework;Assist for transportation;Help with stairs or ramp for entrance   Equipment Recommendations  BSC/3in1    Recommendations for Other Services      Precautions / Restrictions Precautions Precautions: Other (comment) Precaution Comments: R heel pain Restrictions Weight Bearing Restrictions: No       Mobility Bed Mobility Overal bed mobility: Needs Assistance Bed Mobility: Supine to Sit, Sit to Supine     Supine to sit: Supervision, HOB elevated Sit to supine: Supervision, HOB elevated         Transfers Overall transfer level: Needs assistance Equipment used: Rolling walker (2 wheels) Transfers: Sit to/from Stand Sit to Stand: Min guard, From elevated surface                 Balance Overall balance assessment: Needs assistance Sitting-balance support: Feet supported, No upper extremity supported Sitting balance-Leahy Scale: Good     Standing balance support: Bilateral upper extremity supported, No upper extremity supported, During functional activity Standing balance-Leahy Scale: Fair                             ADL either performed or assessed with clinical judgement   ADL Overall ADL's : Needs assistance/impaired     Grooming: Wash/dry face;Wash/dry hands;Sitting;Supervision/safety;Set up;Applying deodorant Grooming Details (indicate cue type and reason): Pt also shaved head while sitting at sink Upper Body Bathing: Sitting;Supervision/ safety;Set up Upper Body Bathing Details (indicate cue type and reason): at sink         Lower Body Dressing: Minimal assistance Lower Body Dressing Details (indicate cue type and reason): to don boxers and Mod A to maintain balance while standing             Functional mobility during ADLs: Min guard;Rolling walker (2 wheels)      Extremity/Trunk Assessment              Vision       Perception     Praxis      Cognition Arousal/Alertness: Awake/alert Behavior During Therapy: WFL for tasks assessed/performed Overall Cognitive Status: Within Functional Limits for tasks assessed  Exercises      Shoulder Instructions       General Comments VSS on RA    Pertinent Vitals/ Pain       Pain Assessment Pain Assessment: No/denies pain  Home Living                                          Prior Functioning/Environment              Frequency  Min 2X/week        Progress Toward Goals  OT  Goals(current goals can now be found in the care plan section)     Acute Rehab OT Goals Patient Stated Goal: To get all of this under control OT Goal Formulation: With patient Time For Goal Achievement: 06/27/22 Potential to Achieve Goals: Good  Plan      Co-evaluation                 AM-PAC OT "6 Clicks" Daily Activity     Outcome Measure   Help from another person eating meals?: None Help from another person taking care of personal grooming?: None Help from another person toileting, which includes using toliet, bedpan, or urinal?: A Little Help from another person bathing (including washing, rinsing, drying)?: A Little Help from another person to put on and taking off regular upper body clothing?: A Little Help from another person to put on and taking off regular lower body clothing?: A Lot 6 Click Score: 19    End of Session Equipment Utilized During Treatment: Gait belt;Rolling walker (2 wheels)  OT Visit Diagnosis: Unsteadiness on feet (R26.81);Pain Pain - Right/Left: Right Pain - part of body: Ankle and joints of foot   Activity Tolerance Patient tolerated treatment well   Patient Left in bed;with call bell/phone within reach   Nurse Communication Mobility status        Time: [id number] OT Time Calculation (min): 50 min  Charges: OT General Charges $OT Visit: 1 Visit OT Treatments $Self Care/Home Management : 23-37 mins $Therapeutic Activity: 8-22 mins  06/23/2022  AB, OTR/L  Acute Rehabilitation Services  Office: [phone]   Cori Razor 06/23/2022, 5:19 PM

## 2022-06-23 NOTE — Progress Notes (Addendum)
[facility] for heparin  Indication: critical limb ischemia   Allergies  Allergen Reactions   Codeine Nausea And Vomiting   Norco [Hydrocodone-Acetaminophen] Nausea And Vomiting   Percocet [Oxycodone-Acetaminophen] Nausea And Vomiting   Tramadol Hcl Nausea And Vomiting    Patient Measurements: Height: 5\' 7"  (170.2 cm) Weight: 88 kg (194 lb 1.6 oz) IBW/kg (Calculated) : 66.1     Vital Signs: Temp: 98 F (36.7 C) (04/04 0419) Temp Source: Oral (04/04 0419) BP: 102/68 (04/04 0419) Pulse Rate: 69 (04/04 0419)  Labs: Recent Labs    06/22/22 0130 06/22/22 0613 06/22/22 1729 06/23/22 0521  HGB  --  9.6*  --  9.4*  HCT  --  29.8*  --  29.6*  PLT  --  331  --  308  HEPARINUNFRC <0.10*  --  <0.10* 0.14*  CREATININE  --  1.49*  --  1.51*     Estimated Creatinine Clearance: 46.8 mL/min (A) (by C-G formula based on SCr of 1.51 mg/dL (H)).   Medical History: Past Medical History:  Diagnosis Date   Carotid artery occlusion    Diabetes mellitus without complication ([place])    Type II   Dyslipidemia 09/27/2012   Erectile dysfunction 09/27/2012   GERD (gastroesophageal reflux disease)    Hypercholesteremia    Hyperlipidemia 08/12/2012   Hypertension    Hyponatremia 08/14/2012   Neuropathy    Pancreatitis    Pancreatitis, acute 09/27/2012   Peripheral vascular disease ([place])    S/P 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 Burt [employer] Bartle   Severe aortic stenosis    Vitamin D deficiency 06/23/2015      Assessment: 76 you male with critical limb ischemia s/p R CFA endarterectomy with femoral to ATA bypass on 3/30 and has been on low dose heparin. Pharmacy consulted to change to full dose. Plans noted for R heel debridement on Friday (R heel osteo).  -current heparin rate= 500 units/hr -hg= 9.6  4/4 AM update:  Heparin level low but trending up  Goal  of Therapy:  Heparin level 0.3-0.7 units/ml Monitor platelets by anticoagulation protocol: Yes  Plan: Increase heparin infusion to 1400units/hr Check heparin level @1500  Monitor for signs/symptoms of bleeding  Narda Bonds, PharmD, BCPS Clinical Pharmacist Phone: [phone]

## 2022-06-23 NOTE — Progress Notes (Addendum)
PROGRESS NOTE    Caleb [name]  [id number] DOB: Nov 22, 1950 DOA: 06/07/2022 PCP: [name], MD  Chief Complaint  Patient presents with   Wound Check   Foot Pain    Brief Narrative:   ZAM ENT is Caleb [name] 72 year old man with PVD, DM 2, HTN, AAS s/p TAVR, HFrEF with [name] EF of 35% send CKD 3 was admitted with recurrent diabetic foot infection and acute on chronic kidney injury. Workup has revealed acute osteomyelitis in the posterolateral aspect of the calcaneus. Seen by ID and per their recommendations, he is on doxycycline/Augmentin pending surgery/bone biopsy per ID recommendations, for which he is potentially scheduled on 06/22/2022 with [name]. Patient is being seen by vascular surgery , Status post angiogram 3/25, with right femoral-tibial bypass graft. Did well postop.   Assessment & Plan:   Principal Problem:   Acute osteomyelitis of right calcaneus Active Problems:   Type 2 diabetes mellitus with hyperlipidemia   Acute kidney injury superimposed on chronic kidney disease   HTN (hypertension)   Hyperlipidemia   Hyponatremia   Dyslipidemia   PAD (peripheral artery disease)   S/P TAVR (transcatheter aortic valve replacement)   Decubitus ulcer of right heel   Diabetic foot ulcer   Gastroenteritis  Addendum: pt notes SOB, requesting higher lasix dose (more similar to home dose).  Will resume 80 mg daily and follow.  Also follow CXR.  Sepsis secondary to acute osteomyelitis right heel and PVD  -Sepsis POA  -Appreciate infectious disease -Antibiotics per ID, continue doxycycline, Augmentin -Aspirin, Plavix, lipitor  -Status post angiogram 3/25.  Status post femoral-tibial bypass.  Per [name] note, he is scheduled for surgical debridement on Friday.  Please send tissue for culture.   AKI on CKD stage IIIb, resolved -Baseline Cr 1.5-2.5 -Appreciate nephrology -Thought to be secondary to hypovolemia while on Entresto.  [name] was resumed on 06/13/2022.  He is  getting intermittent IV Lasix.  Scheduled oral Lasix resumed on 06/20/2022. -Nephrology signed off 3/22.  Follow-up with [name] later this month -Improving   Hyperkalemia -Resolved   Diabetes mellitus type 2 with hyperglycemia -Blood sugar controlled, currently on NovoLog Mix 70/30 9 units twice daily as well as SSI.  With plan for surgery Friday, transition to levemir 5 units BID Thursday PM.    DVT prophylaxis: heparin gtt Code Status: full Family Communication: none Disposition:   Status is: Inpatient Remains inpatient appropriate because: pending ortho procedure    Consultants:  Vascular ID ortho  Procedures:   3/25 Conscious sedation Ultrasound-guided access to the left common femoral artery CO2 aortogram and iliac arteriogram Selective catheterization of the right external iliac artery (second-order catheterization) with right lower extremity runoff Mynx closure of the left common femoral artery   Antimicrobials:  Anti-infectives (From admission, onward)    Start     Dose/Rate Route Frequency Ordered Stop   06/21/22 1000  amoxicillin-clavulanate (AUGMENTIN) 875-125 MG per tablet 1 tablet        1 tablet Oral Every 12 hours 06/21/22 0657     06/17/22 0740  ceFAZolin (ANCEF) 2-4 GM/100ML-% IVPB       Note to Pharmacy: Bobbie Stack M: cabinet override      06/17/22 0740 06/17/22 1944   06/16/22 1800  doxycycline (VIBRA-TABS) tablet 100 mg        100 mg Oral 2 times daily after meals 06/16/22 1600     06/09/22 1245  doxycycline (VIBRA-TABS) tablet 100 mg  Status:  Discontinued  100 mg Oral Every 12 hours 06/09/22 1145 06/16/22 1600   06/09/22 1245  amoxicillin-clavulanate (AUGMENTIN) 500-125 MG per tablet 1 tablet  Status:  Discontinued        1 tablet Oral 2 times daily 06/09/22 1145 06/21/22 0657   06/08/22 1600  cefTRIAXone (ROCEPHIN) 2 g in sodium chloride 0.9 % 100 mL IVPB  Status:  Discontinued        2 g 200 mL/hr over 30 Minutes Intravenous Every  24 hours 06/08/22 0852 06/09/22 1145   06/08/22 1200  DAPTOmycin (CUBICIN) 600 mg in sodium chloride 0.9 % IVPB  Status:  Discontinued        8 mg/kg  77.4 kg 124 mL/hr over 30 Minutes Intravenous Every 48 hours 06/08/22 1012 06/09/22 1145   06/07/22 1415  linezolid (ZYVOX) IVPB 600 mg  Status:  Discontinued        600 mg 300 mL/hr over 60 Minutes Intravenous Every 12 hours 06/07/22 1414 06/08/22 0852   06/07/22 1415  ceFEPIme (MAXIPIME) 2 g in sodium chloride 0.9 % 100 mL IVPB  Status:  Discontinued        2 g 200 mL/hr over 30 Minutes Intravenous Daily 06/07/22 1414 06/08/22 0852       Subjective: C/o pain in heel after walking from bathroom   Objective: Vitals:   06/23/22 0000 06/23/22 0419 06/23/22 0826 06/23/22 1243  BP:  102/68 118/73 118/84  Pulse:  69 76 84  Resp:  17 18 15   Temp:  98 F (36.7 C) 97.7 F (36.5 C) 97.9 F (36.6 C)  TempSrc:  Oral Oral Oral  SpO2:  98% 100% 99%  Weight: 88 kg     Height:       No intake or output data in the 24 hours ending 06/23/22 1642  Filed Weights   06/21/22 0636 06/22/22 0310 06/23/22 0000  Weight: 85.8 kg 88.5 kg 88 kg    Examination:  General: No acute distress. Uncomfortable after walking to bed from bathroom  Cardiovascular: RRR Lungs: unlabored Neurological: Alert and oriented 3. Moves all extremities 4 with equal strength. Cranial nerves II through XII grossly intact. Extremities: dressing to RLE    Data Reviewed: I have personally reviewed following labs and imaging studies  CBC: Recent Labs  Lab 06/17/22 0048 06/18/22 0313 06/20/22 0104 06/22/22 0613 06/23/22 0521  WBC 10.9* 12.3* 11.9* 8.6 7.5  HGB 11.2* 10.9* 9.9* 9.6* 9.4*  HCT 34.2* 32.6* 30.0* 29.8* 29.6*  MCV 81.0 81.1 82.0 81.9 82.9  PLT 323 316 328 331 [id number]    Basic Metabolic Panel: Recent Labs  Lab 06/17/22 0048 06/18/22 0313 06/20/22 0104 06/22/22 0613 06/23/22 0521  NA 133* 131* 134* 132* 133*  K 3.9 4.6 5.0 4.6 5.1  CL 99 98  100 98 102  CO2 25 24 27 25 27   GLUCOSE 106* 148* 134* 136* 88  BUN 15 19 27* 32* 33*  CREATININE 1.19 1.36* 1.45* 1.49* 1.51*  CALCIUM 8.2* 8.2* 8.2* 8.3* 8.4*    GFR: Estimated Creatinine Clearance: 46.8 mL/min (Brittain Hosie) (by C-G formula based on SCr of 1.51 mg/dL (H)).  Liver Function Tests: No results for input(s): "AST", "ALT", "ALKPHOS", "BILITOT", "PROT", "ALBUMIN" in the last 168 hours.  CBG: Recent Labs  Lab 06/22/22 1144 06/22/22 1656 06/22/22 2108 06/23/22 0604 06/23/22 1237  GLUCAP 158* 120* 171* 81 141*     Recent Results (from the past 240 hour(s))  Surgical pcr screen     Status: None  Collection Time: 06/16/22  9:23 PM   Specimen: Nasal Mucosa; Nasal Swab  Result Value Ref Range Status   MRSA, PCR NEGATIVE NEGATIVE Final   Staphylococcus aureus NEGATIVE NEGATIVE Final    Comment: (NOTE) The Xpert SA Assay (FDA approved for NASAL specimens in patients 16 years of age and older), is one component of [facility] comprehensive surveillance program. It is not intended to diagnose infection nor to guide or monitor treatment. Performed at [facility], [place] [address], [place], [place] [address]          Radiology Studies: No results found.      Scheduled Meds:  amoxicillin-clavulanate  1 tablet Oral Q12H   aspirin  81 mg Oral Daily   atorvastatin  40 mg Oral Daily   carvedilol  12.5 mg Oral BID WC   Chlorhexidine Gluconate Cloth  6 each Topical Daily   cholecalciferol  1,000 Units Oral Daily   cyanocobalamin  500 mcg Oral q AM   doxycycline  100 mg Oral BID PC   ferrous sulfate  325 mg Oral Q breakfast   furosemide  40 mg Oral Daily   hydrALAZINE  25 mg Oral TID   insulin aspart  0-15 Units Subcutaneous TID WC   insulin aspart  0-5 Units Subcutaneous QHS   insulin detemir  5 Units Subcutaneous BID   isosorbide mononitrate  30 mg Oral Daily   pantoprazole  40 mg Oral Daily   sacubitril-valsartan  1 tablet Oral BID   Continuous Infusions:  sodium  chloride     heparin 1,400 Units/hr (06/23/22 0731)   magnesium sulfate bolus IVPB       LOS: 16 days    Time spent: over 30 min    Fayrene Helper, MD [facility]   To contact the attending provider between 7A-7P or the covering provider during after hours 7P-7A, please log into the web site [email or web address] and access using universal  password for that web site. If you do not have the password, please call the hospital operator.  06/23/2022, 4:42 PM

## 2022-06-23 NOTE — Progress Notes (Addendum)
  Progress Note    06/23/2022 8:09 AM 6 Days Post-Op  Subjective:  no complaints   Vitals:   06/22/22 2335 06/23/22 0419  BP: 105/73 102/68  Pulse: 75 69  Resp: 20 17  Temp: 98 F (36.7 C) 98 F (36.7 C)  SpO2: 96% 98%   Physical Exam: Lungs:  non labored Incisions:  incisions c/d/i Extremities:  palpable bypass pulse Neurologic: A&O  CBC    Component Value Date/Time   WBC 7.5 06/23/2022 0521   RBC 3.57 (L) 06/23/2022 0521   HGB 9.4 (L) 06/23/2022 0521   HGB 14.6 03/09/2022 1233   HGB 13.3 11/15/2019 1139   HCT 29.6 (L) 06/23/2022 0521   HCT 40.2 11/15/2019 1139   PLT 308 06/23/2022 0521   PLT 216 03/09/2022 1233   PLT 300 11/15/2019 1139   MCV 82.9 06/23/2022 0521   MCV 86 11/15/2019 1139   MCH 26.3 06/23/2022 0521   MCHC 31.8 06/23/2022 0521   RDW 16.4 (H) 06/23/2022 0521   RDW 14.5 11/15/2019 1139   LYMPHSABS 1.7 06/16/2022 0057   LYMPHSABS 3.3 (H) 09/28/2017 1113   MONOABS 1.3 (H) 06/16/2022 0057   EOSABS 0.6 (H) 06/16/2022 0057   EOSABS 0.3 09/28/2017 1113   BASOSABS 0.0 06/16/2022 0057   BASOSABS 0.0 09/28/2017 1113    BMET    Component Value Date/Time   NA 133 (L) 06/23/2022 0521   NA 142 02/16/2022 1544   K 5.1 06/23/2022 0521   CL 102 06/23/2022 0521   CO2 27 06/23/2022 0521   GLUCOSE 88 06/23/2022 0521   BUN 33 (H) 06/23/2022 0521   BUN 22 02/16/2022 1544   CREATININE 1.51 (H) 06/23/2022 0521   CREATININE 1.36 (H) 03/09/2022 1233   CREATININE 0.86 06/06/2016 1455   CALCIUM 8.4 (L) 06/23/2022 0521   GFRNONAA 49 (L) 06/23/2022 0521   GFRNONAA 56 (L) 03/09/2022 1233   GFRNONAA 87 04/02/2013 1632   GFRAA >60 12/17/2019 0354   GFRAA >89 04/02/2013 1632    INR    Component Value Date/Time   INR 1.0 01/11/2022 0957     Intake/Output Summary (Last 24 hours) at 06/23/2022 0809 Last data filed at 06/22/2022 1012 Gross per 24 hour  Intake --  Output 500 ml  Net -500 ml     Assessment/Plan:  72 y.o. male is s/p R CFA endarterectomy  with femoral to ATA bypass with vein  6 Days Post-Op   R foot well perfused with palpable bypass pulse Continue therapeutic heparin OOB with offloading heel shoe Intermittent lasix however Cr continues to worsen; defer to primary team Plans noted for debridement of R heel by [name] tomorrow   Dagoberto Ligas, PA-C [facility] [phone] 06/23/2022 8:09 AM  VASCULAR STAFF ADDENDUM: I have independently interviewed and examined the patient. I agree with the above.  Ted removed due to heel pain.  Continue current therapies. Signal unchanged, wounds healing appropriately.   Cassandria Santee, MD [facility] Phone Number: [phone] 06/23/2022 1:10 PM

## 2022-06-24 ENCOUNTER — Encounter (HOSPITAL_COMMUNITY): Payer: Self-pay | Admitting: Internal Medicine

## 2022-06-24 ENCOUNTER — Encounter (HOSPITAL_COMMUNITY)
Admission: EM | Disposition: A | Discharge status: Skilled Nursing Facility | Payer: Self-pay | Source: Home / Self Care | Attending: Family Medicine

## 2022-06-24 ENCOUNTER — Inpatient Hospital Stay (HOSPITAL_COMMUNITY): Payer: Medicare HMO | Admitting: Anesthesiology

## 2022-06-24 ENCOUNTER — Other Ambulatory Visit: Payer: Self-pay

## 2022-06-24 DIAGNOSIS — Z794 Long term (current) use of insulin: Secondary | ICD-10-CM

## 2022-06-24 DIAGNOSIS — M86171 Other acute osteomyelitis, right ankle and foot: Secondary | ICD-10-CM | POA: Diagnosis not present

## 2022-06-24 DIAGNOSIS — E1152 Type 2 diabetes mellitus with diabetic peripheral angiopathy with gangrene: Secondary | ICD-10-CM

## 2022-06-24 DIAGNOSIS — E11621 Type 2 diabetes mellitus with foot ulcer: Secondary | ICD-10-CM | POA: Diagnosis not present

## 2022-06-24 DIAGNOSIS — L97419 Non-pressure chronic ulcer of right heel and midfoot with unspecified severity: Secondary | ICD-10-CM

## 2022-06-24 DIAGNOSIS — E1169 Type 2 diabetes mellitus with other specified complication: Secondary | ICD-10-CM

## 2022-06-24 DIAGNOSIS — M869 Osteomyelitis, unspecified: Secondary | ICD-10-CM

## 2022-06-24 DIAGNOSIS — D649 Anemia, unspecified: Secondary | ICD-10-CM

## 2022-06-24 DIAGNOSIS — E1151 Type 2 diabetes mellitus with diabetic peripheral angiopathy without gangrene: Secondary | ICD-10-CM | POA: Diagnosis not present

## 2022-06-24 HISTORY — PX: I & D EXTREMITY: SHX5045

## 2022-06-24 HISTORY — PX: APPLICATION OF WOUND VAC: SHX5189

## 2022-06-24 LAB — GLUCOSE, CAPILLARY
Glucose-Capillary: 138 mg/dL — ABNORMAL HIGH (ref 70–99)
Glucose-Capillary: 141 mg/dL — ABNORMAL HIGH (ref 70–99)
Glucose-Capillary: 119 mg/dL — ABNORMAL HIGH (ref 70–99)
Glucose-Capillary: 123 mg/dL — ABNORMAL HIGH (ref 70–99)
Glucose-Capillary: 120 mg/dL — ABNORMAL HIGH (ref 70–99)
Glucose-Capillary: 175 mg/dL — ABNORMAL HIGH (ref 70–99)

## 2022-06-24 LAB — AEROBIC/ANAEROBIC CULTURE W GRAM STAIN (SURGICAL/DEEP WOUND): Gram Stain: NONE SEEN

## 2022-06-24 LAB — CBC
HCT: 29.5 % — ABNORMAL LOW (ref 39.0–52.0)
Hemoglobin: 9.9 g/dL — ABNORMAL LOW (ref 13.0–17.0)
MCH: 27 pg (ref 26.0–34.0)
MCHC: 33.6 g/dL (ref 30.0–36.0)
MCV: 80.6 fL (ref 80.0–100.0)
Platelets: 327 10*3/uL (ref 150–400)
RBC: 3.66 MIL/uL — ABNORMAL LOW (ref 4.22–5.81)
RDW: 16.4 % — ABNORMAL HIGH (ref 11.5–15.5)
WBC: 7.6 10*3/uL (ref 4.0–10.5)
nRBC: 0 % (ref 0.0–0.2)

## 2022-06-24 LAB — BASIC METABOLIC PANEL
Anion gap: 9 (ref 5–15)
BUN: 30 mg/dL — ABNORMAL HIGH (ref 8–23)
CO2: 24 mmol/L (ref 22–32)
Calcium: 8.4 mg/dL — ABNORMAL LOW (ref 8.9–10.3)
Chloride: 99 mmol/L (ref 98–111)
Creatinine, Ser: 1.54 mg/dL — ABNORMAL HIGH (ref 0.61–1.24)
GFR, Estimated: 48 mL/min — ABNORMAL LOW (ref >60)
Glucose, Bld: 129 mg/dL — ABNORMAL HIGH (ref 70–99)
Potassium: 4.7 mmol/L (ref 3.5–5.1)
Sodium: 132 mmol/L — ABNORMAL LOW (ref 135–145)

## 2022-06-24 LAB — HEPARIN LEVEL (UNFRACTIONATED)
Heparin Unfractionated: 0.26 IU/mL — ABNORMAL LOW (ref 0.30–0.70)
Heparin Unfractionated: <0.1 IU/mL — ABNORMAL LOW (ref 0.30–0.70)
Heparin Unfractionated: 0.24 IU/mL — ABNORMAL LOW (ref 0.30–0.70)

## 2022-06-24 SURGERY — IRRIGATION AND DEBRIDEMENT EXTREMITY
Anesthesia: General | Laterality: Right

## 2022-06-24 MED ORDER — LABETALOL HCL 5 MG/ML IV SOLN: 10.0000 mg | INTRAVENOUS | Status: DC | PRN

## 2022-06-24 MED ORDER — POLYETHYLENE GLYCOL 3350 17 G PO PACK: 17.0000 g | PACK | Freq: Every day | ORAL | Status: DC | PRN

## 2022-06-24 MED ORDER — HYDROMORPHONE HCL 1 MG/ML IJ SOLN
0.5000 mg | INTRAMUSCULAR | Status: DC | PRN
  Administered 2022-06-24 – 2022-06-25 (×6): 1 mg via INTRAVENOUS
  Administered 2022-06-25: 0.5 mg via INTRAVENOUS
  Administered 2022-06-26: 1 mg via INTRAVENOUS
  Administered 2022-06-26 (×3): 0.5 mg via INTRAVENOUS
  Administered 2022-06-27 – 2022-06-30 (×11): 1 mg via INTRAVENOUS
  Filled 2022-06-24 (×2): qty 1
  Filled 2022-06-24: qty 0.5
  Filled 2022-06-24 (×9): qty 1
  Filled 2022-06-24: qty 0.5
  Filled 2022-06-24 (×5): qty 1
  Filled 2022-06-24: qty 0.5
  Filled 2022-06-24: qty 1
  Filled 2022-06-24: qty 0.5
  Filled 2022-06-24: qty 1

## 2022-06-24 MED ORDER — POTASSIUM CHLORIDE CRYS ER 20 MEQ PO TBCR: 20.0000 meq | EXTENDED_RELEASE_TABLET | Freq: Every day | ORAL | Status: DC | PRN

## 2022-06-24 MED ORDER — FENTANYL CITRATE (PF) 100 MCG/2ML IJ SOLN
INTRAMUSCULAR | Status: AC
  Filled 2022-06-24: qty 2

## 2022-06-24 MED ORDER — ORAL CARE MOUTH RINSE: 15.0000 mL | Freq: Once | OROMUCOSAL | Status: AC

## 2022-06-24 MED ORDER — PROPOFOL 10 MG/ML IV BOLUS
INTRAVENOUS | Status: DC | PRN
  Administered 2022-06-24: 100 mg via INTRAVENOUS

## 2022-06-24 MED ORDER — FENTANYL CITRATE (PF) 250 MCG/5ML IJ SOLN
INTRAMUSCULAR | Status: AC
  Filled 2022-06-24: qty 5

## 2022-06-24 MED ORDER — OXYCODONE HCL 5 MG PO TABS: 10.0000 mg | ORAL_TABLET | ORAL | Status: DC | PRN

## 2022-06-24 MED ORDER — MEPERIDINE HCL 25 MG/ML IJ SOLN: 6.2500 mg | INTRAMUSCULAR | Status: DC | PRN

## 2022-06-24 MED ORDER — MAGNESIUM CITRATE PO SOLN
1.0000 | Freq: Once | ORAL | Status: DC | PRN
  Filled 2022-06-24: qty 296

## 2022-06-24 MED ORDER — HYDRALAZINE HCL 20 MG/ML IJ SOLN: 5.0000 mg | INTRAMUSCULAR | Status: DC | PRN

## 2022-06-24 MED ORDER — LIDOCAINE 2% (20 MG/ML) 5 ML SYRINGE
INTRAMUSCULAR | Status: DC | PRN
  Administered 2022-06-24: 60 mg via INTRAVENOUS

## 2022-06-24 MED ORDER — VITAMIN C 500 MG PO TABS
1000.0000 mg | ORAL_TABLET | Freq: Every day | ORAL | Status: DC
  Administered 2022-06-24 – 2022-06-30 (×7): 1000 mg via ORAL
  Filled 2022-06-24 (×7): qty 2

## 2022-06-24 MED ORDER — PHENYLEPHRINE HCL (PRESSORS) 10 MG/ML IV SOLN
INTRAVENOUS | Status: AC
  Filled 2022-06-24: qty 1

## 2022-06-24 MED ORDER — ALUM & MAG HYDROXIDE-SIMETH 200-200-20 MG/5ML PO SUSP: 15.0000 mL | ORAL | Status: DC | PRN

## 2022-06-24 MED ORDER — OXYCODONE HCL 5 MG/5ML PO SOLN: 5.0000 mg | Freq: Once | ORAL | Status: DC | PRN

## 2022-06-24 MED ORDER — MAGNESIUM SULFATE 2 GM/50ML IV SOLN: 2.0000 g | Freq: Every day | INTRAVENOUS | Status: DC | PRN

## 2022-06-24 MED ORDER — OXYCODONE HCL 5 MG PO TABS: 5.0000 mg | ORAL_TABLET | ORAL | Status: DC | PRN

## 2022-06-24 MED ORDER — LACTATED RINGERS IV SOLN: INTRAVENOUS | Status: DC | PRN

## 2022-06-24 MED ORDER — SODIUM CHLORIDE 0.9 % IV SOLN: INTRAVENOUS | Status: DC

## 2022-06-24 MED ORDER — CHLORHEXIDINE GLUCONATE 0.12 % MT SOLN: 15.0000 mL | Freq: Once | OROMUCOSAL | Status: AC

## 2022-06-24 MED ORDER — EPHEDRINE SULFATE-NACL 50-0.9 MG/10ML-% IV SOSY
PREFILLED_SYRINGE | INTRAVENOUS | Status: DC | PRN
  Administered 2022-06-24: 15 mg via INTRAVENOUS
  Administered 2022-06-24: 10 mg via INTRAVENOUS

## 2022-06-24 MED ORDER — ACETAMINOPHEN 325 MG PO TABS: 325.0000 mg | ORAL_TABLET | ORAL | Status: DC | PRN

## 2022-06-24 MED ORDER — JUVEN PO PACK
1.0000 | PACK | Freq: Two times a day (BID) | ORAL | Status: DC
  Administered 2022-06-24 – 2022-06-30 (×14): 1 via ORAL
  Filled 2022-06-24 (×14): qty 1

## 2022-06-24 MED ORDER — INSULIN ASPART 100 UNIT/ML IJ SOLN
3.0000 [IU] | Freq: Three times a day (TID) | INTRAMUSCULAR | Status: DC
  Administered 2022-06-25 – 2022-06-30 (×16): 3 [IU] via SUBCUTANEOUS

## 2022-06-24 MED ORDER — GUAIFENESIN-DM 100-10 MG/5ML PO SYRP: 15.0000 mL | ORAL_SOLUTION | ORAL | Status: DC | PRN

## 2022-06-24 MED ORDER — LACTATED RINGERS IV SOLN: INTRAVENOUS | Status: DC

## 2022-06-24 MED ORDER — ONDANSETRON HCL 4 MG/2ML IJ SOLN
INTRAMUSCULAR | Status: DC | PRN
  Administered 2022-06-24: 4 mg via INTRAVENOUS

## 2022-06-24 MED ORDER — PANTOPRAZOLE SODIUM 40 MG PO TBEC: 40.0000 mg | DELAYED_RELEASE_TABLET | Freq: Every day | ORAL | Status: DC

## 2022-06-24 MED ORDER — OXYCODONE HCL 5 MG PO TABS: 5.0000 mg | ORAL_TABLET | Freq: Once | ORAL | Status: DC | PRN

## 2022-06-24 MED ORDER — ZINC SULFATE 220 (50 ZN) MG PO CAPS
220.0000 mg | ORAL_CAPSULE | Freq: Every day | ORAL | Status: DC
  Administered 2022-06-24 – 2022-06-30 (×7): 220 mg via ORAL
  Filled 2022-06-24 (×7): qty 1

## 2022-06-24 MED ORDER — ACETAMINOPHEN 325 MG PO TABS: 325.0000 mg | ORAL_TABLET | Freq: Four times a day (QID) | ORAL | Status: DC | PRN

## 2022-06-24 MED ORDER — CEFAZOLIN SODIUM-DEXTROSE 2-4 GM/100ML-% IV SOLN
2.0000 g | Freq: Three times a day (TID) | INTRAVENOUS | Status: AC
  Administered 2022-06-24: 2 g via INTRAVENOUS
  Filled 2022-06-24: qty 100

## 2022-06-24 MED ORDER — DOCUSATE SODIUM 100 MG PO CAPS
100.0000 mg | ORAL_CAPSULE | Freq: Every day | ORAL | Status: DC
  Administered 2022-06-26: 100 mg via ORAL
  Filled 2022-06-24 (×5): qty 1

## 2022-06-24 MED ORDER — FENTANYL CITRATE (PF) 100 MCG/2ML IJ SOLN
INTRAMUSCULAR | Status: DC | PRN
  Administered 2022-06-24: 50 ug via INTRAVENOUS

## 2022-06-24 MED ORDER — PHENYLEPHRINE HCL-NACL 20-0.9 MG/250ML-% IV SOLN
INTRAVENOUS | Status: DC | PRN
  Administered 2022-06-24: 50 ug/min via INTRAVENOUS

## 2022-06-24 MED ORDER — 0.9 % SODIUM CHLORIDE (POUR BTL) OPTIME
TOPICAL | Status: DC | PRN
  Administered 2022-06-24: 1000 mL

## 2022-06-24 MED ORDER — ONDANSETRON HCL 4 MG/2ML IJ SOLN: 4.0000 mg | Freq: Four times a day (QID) | INTRAMUSCULAR | Status: DC | PRN

## 2022-06-24 MED ORDER — CHLORHEXIDINE GLUCONATE 0.12 % MT SOLN
OROMUCOSAL | Status: AC
  Administered 2022-06-24: 15 mL via OROMUCOSAL
  Filled 2022-06-24: qty 15

## 2022-06-24 MED ORDER — PHENYLEPHRINE 80 MCG/ML (10ML) SYRINGE FOR IV PUSH (FOR BLOOD PRESSURE SUPPORT)
PREFILLED_SYRINGE | INTRAVENOUS | Status: DC | PRN
  Administered 2022-06-24: 200 ug via INTRAVENOUS
  Administered 2022-06-24: 160 ug via INTRAVENOUS

## 2022-06-24 MED ORDER — FENTANYL CITRATE (PF) 100 MCG/2ML IJ SOLN
25.0000 ug | INTRAMUSCULAR | Status: DC | PRN
  Administered 2022-06-24 (×2): 50 ug via INTRAVENOUS

## 2022-06-24 MED ORDER — ONDANSETRON HCL 4 MG/2ML IJ SOLN: 4.0000 mg | Freq: Once | INTRAMUSCULAR | Status: DC | PRN

## 2022-06-24 MED ORDER — PHENOL 1.4 % MT LIQD: 1.0000 | OROMUCOSAL | Status: DC | PRN

## 2022-06-24 MED ORDER — ACETAMINOPHEN 160 MG/5ML PO SOLN: 325.0000 mg | ORAL | Status: DC | PRN

## 2022-06-24 MED ORDER — BISACODYL 5 MG PO TBEC: 5.0000 mg | DELAYED_RELEASE_TABLET | Freq: Every day | ORAL | Status: DC | PRN

## 2022-06-24 MED ORDER — METOPROLOL TARTRATE 5 MG/5ML IV SOLN: 2.0000 mg | INTRAVENOUS | Status: DC | PRN

## 2022-06-24 SURGICAL SUPPLY — 40 items
BAG COUNTER SPONGE SURGICOUNT (BAG) IMPLANT
BAG SPNG CNTER NS LX DISP (BAG)
BLADE LONG MED 31X9 (MISCELLANEOUS) IMPLANT
BLADE SURG 21 STRL SS (BLADE) ×3 IMPLANT
BNDG CMPR 5X6 CHSV STRCH STRL (GAUZE/BANDAGES/DRESSINGS)
BNDG COHESIVE 6X5 TAN ST LF (GAUZE/BANDAGES/DRESSINGS) IMPLANT
BNDG GAUZE DERMACEA FLUFF 4 (GAUZE/BANDAGES/DRESSINGS) ×6 IMPLANT
BNDG GZE DERMACEA 4 6PLY (GAUZE/BANDAGES/DRESSINGS) ×4
CANISTER WOUNDNEG PRESSURE 500 (CANNISTER) IMPLANT
COVER SURGICAL LIGHT HANDLE (MISCELLANEOUS) ×6 IMPLANT
DRAPE DERMATAC (DRAPES) IMPLANT
DRAPE INCISE IOBAN 66X45 STRL (DRAPES) IMPLANT
DRAPE U-SHAPE 47X51 STRL (DRAPES) ×3 IMPLANT
DRESSING PEEL AND PLC PRVNA 13 (GAUZE/BANDAGES/DRESSINGS) IMPLANT
DRSG ADAPTIC 3X8 NADH LF (GAUZE/BANDAGES/DRESSINGS) ×3 IMPLANT
DRSG PEEL AND PLACE PREVENA 13 (GAUZE/BANDAGES/DRESSINGS) ×2
DURAPREP 26ML APPLICATOR (WOUND CARE) ×3 IMPLANT
ELECT REM PT RETURN 9FT ADLT (ELECTROSURGICAL)
ELECTRODE REM PT RTRN 9FT ADLT (ELECTROSURGICAL) IMPLANT
GAUZE SPONGE 4X4 12PLY STRL (GAUZE/BANDAGES/DRESSINGS) ×3 IMPLANT
GLOVE BIOGEL PI IND STRL 9 (GLOVE) ×3 IMPLANT
GLOVE SURG ORTHO 9.0 STRL STRW (GLOVE) ×3 IMPLANT
GOWN STRL REUS W/ TWL XL LVL3 (GOWN DISPOSABLE) ×6 IMPLANT
GOWN STRL REUS W/TWL XL LVL3 (GOWN DISPOSABLE) ×4
GRAFT SKIN MARIGEN MICRO 38 (Tissue) IMPLANT
HANDPIECE INTERPULSE COAX TIP (DISPOSABLE)
KIT BASIN OR (CUSTOM PROCEDURE TRAY) ×3 IMPLANT
KIT TURNOVER KIT B (KITS) ×3 IMPLANT
MANIFOLD NEPTUNE II (INSTRUMENTS) ×3 IMPLANT
NS IRRIG 1000ML POUR BTL (IV SOLUTION) ×3 IMPLANT
PACK ORTHO EXTREMITY (CUSTOM PROCEDURE TRAY) ×3 IMPLANT
PAD ARMBOARD 7.5X6 YLW CONV (MISCELLANEOUS) ×6 IMPLANT
SET HNDPC FAN SPRY TIP SCT (DISPOSABLE) IMPLANT
STOCKINETTE IMPERVIOUS 9X36 MD (GAUZE/BANDAGES/DRESSINGS) IMPLANT
SUT ETHILON 2 0 PSLX (SUTURE) ×3 IMPLANT
SWAB COLLECTION DEVICE MRSA (MISCELLANEOUS) ×3 IMPLANT
SWAB CULTURE ESWAB REG 1ML (MISCELLANEOUS) IMPLANT
TOWEL GREEN STERILE (TOWEL DISPOSABLE) ×3 IMPLANT
TUBE CONNECTING 12X1/4 (SUCTIONS) ×3 IMPLANT
YANKAUER SUCT BULB TIP NO VENT (SUCTIONS) ×3 IMPLANT

## 2022-06-24 NOTE — Progress Notes (Signed)
PROGRESS NOTE    Caleb [name]  CRF:[id number] DOB: Sep 12, 1950 DOA: 06/07/2022 PCP: Caleb Register, MD  Chief Complaint  Patient presents with   Wound Check   Foot Pain    Brief Narrative:   Caleb [name] is Caleb [name] 72 year old man with PVD, DM 2, HTN, AAS s/p TAVR, HFrEF with Caleb [name] EF of 35% send CKD 3 was admitted with recurrent diabetic foot infection and acute on chronic kidney injury. Workup has revealed acute osteomyelitis in the posterolateral aspect of the calcaneus. Seen by ID and per their recommendations, he is on doxycycline/Augmentin pending surgery/bone biopsy per ID recommendations, for which he is potentially scheduled on 06/22/2022 with Dr. Lajoyce Corners. Patient is being seen by vascular surgery , Status post angiogram 3/25, with right femoral-tibial bypass graft. Did well postop.   Assessment & Plan:   Principal Problem:   Acute osteomyelitis of right calcaneus Active Problems:   Type 2 diabetes mellitus with hyperlipidemia   Acute kidney injury superimposed on chronic kidney disease   HTN (hypertension)   Hyperlipidemia   Hyponatremia   Dyslipidemia   PAD (peripheral artery disease)   S/P TAVR (transcatheter aortic valve replacement)   Decubitus ulcer of right heel   Diabetic foot ulcer   Gastroenteritis  Sepsis secondary to acute osteomyelitis right heel and PVD  -Sepsis POA  -Appreciate infectious disease -Antibiotics per ID, continue doxycycline, Augmentin -Aspirin, Plavix, lipitor  -Status post angiogram 3/25.  Status post femoral-tibial bypass.  Continue heparin, will need doac at discharge.   -s/p R partial calcaneus excision, excision of skin and soft tissue R heel  - pending surgical cultures  AKI on CKD stage IIIb, resolved -Baseline Cr 1.5-2.5 -Appreciate nephrology -Thought to be secondary to hypovolemia while on Entresto.  Caleb [name] was resumed on 06/13/2022.  He is getting intermittent IV Lasix.  Scheduled oral Lasix resumed on 06/20/2022 -> increase to 80 mg  daily, more like his home regimen. -Nephrology signed off 3/22.  Follow-up with Dr. Marisue [name] later this month -Improving   SOB - CXR without active disease, no complaints today - continue lasix as above  Hyperkalemia -Resolved   Diabetes mellitus type 2 with hyperglycemia Hold home 70/30 while inpatient.  Continue basal and mealtime with SSI.      DVT prophylaxis: heparin gtt Code Status: full Family Communication: none Disposition:   Status is: Inpatient Remains inpatient appropriate because: pending ortho procedure    Consultants:  Vascular ID ortho  Procedures:   4/5 RIGHT PARTIAL CALCANEUS EXCISION, excision of skin and soft tissue right heel.   3/25 Conscious sedation Ultrasound-guided access to the left common femoral artery CO2 aortogram and iliac arteriogram Selective catheterization of the right external iliac artery (second-order catheterization) with right lower extremity runoff Mynx closure of the left common femoral artery   Antimicrobials:  Anti-infectives (From admission, onward)    Start     Dose/Rate Route Frequency Ordered Stop   06/24/22 1300  ceFAZolin (ANCEF) IVPB 2g/100 mL premix        2 g 200 mL/hr over 30 Minutes Intravenous Every 8 hours 06/24/22 1016 06/24/22 1330   06/24/22 0600  ceFAZolin (ANCEF) IVPB 2g/100 mL premix        2 g 200 mL/hr over 30 Minutes Intravenous On call to O.R. 06/23/22 1747 06/24/22 0841   06/21/22 1000  amoxicillin-clavulanate (AUGMENTIN) 875-125 MG per tablet 1 tablet        1 tablet Oral Every 12 hours 06/21/22 0657     06/17/22 0740  ceFAZolin (ANCEF) 2-4 GM/100ML-% IVPB       Note to Pharmacy: Launa Flight M: cabinet override      06/17/22 0740 06/17/22 1944   06/16/22 1800  doxycycline (VIBRA-TABS) tablet 100 mg        100 mg Oral 2 times daily after meals 06/16/22 1600     06/09/22 1245  doxycycline (VIBRA-TABS) tablet 100 mg  Status:  Discontinued        100 mg Oral Every 12 hours 06/09/22 1145  06/16/22 1600   06/09/22 1245  amoxicillin-clavulanate (AUGMENTIN) 500-125 MG per tablet 1 tablet  Status:  Discontinued        1 tablet Oral 2 times daily 06/09/22 1145 06/21/22 0657   06/08/22 1600  cefTRIAXone (ROCEPHIN) 2 g in sodium chloride 0.9 % 100 mL IVPB  Status:  Discontinued        2 g 200 mL/hr over 30 Minutes Intravenous Every 24 hours 06/08/22 0852 06/09/22 1145   06/08/22 1200  DAPTOmycin (CUBICIN) 600 mg in sodium chloride 0.9 % IVPB  Status:  Discontinued        8 mg/kg  77.4 kg 124 mL/hr over 30 Minutes Intravenous Every 48 hours 06/08/22 1012 06/09/22 1145   06/07/22 1415  linezolid (ZYVOX) IVPB 600 mg  Status:  Discontinued        600 mg 300 mL/hr over 60 Minutes Intravenous Every 12 hours 06/07/22 1414 06/08/22 0852   06/07/22 1415  ceFEPIme (MAXIPIME) 2 g in sodium chloride 0.9 % 100 mL IVPB  Status:  Discontinued        2 g 200 mL/hr over 30 Minutes Intravenous Daily 06/07/22 1414 06/08/22 0852       Subjective: C/o pain post op Doing Caleb [name] little better after pain meds  Objective: Vitals:   06/24/22 1145 06/24/22 1200 06/24/22 1215 06/24/22 1230  BP: 120/77 119/75 119/77 121/73  Pulse: 78 76 77 78  Resp: 11 18 11 11   Temp:  97.8 F (36.6 C)    TempSrc:  Oral    SpO2: 94% 99% 98% 98%  Weight:      Height:        Intake/Output Summary (Last 24 hours) at 06/24/2022 1530 Last data filed at 06/24/2022 1430 Gross per 24 hour  Intake 1898.04 ml  Output 3050 ml  Net -1151.96 ml    Filed Weights   06/23/22 0000 06/24/22 0304 06/24/22 0747  Weight: 88 kg 86.7 kg 86.7 kg    Examination:  General: No acute distress. Cardiovascular: RRR Lungs: unlabored Neurological: Alert and oriented 3. Moves all extremities 4 with equal strength. Cranial nerves II through XII grossly intact. Extremities: wound vac to RLE    Data Reviewed: I have personally reviewed following labs and imaging studies  CBC: Recent Labs  Lab 06/18/22 0313 06/20/22 0104  06/22/22 0613 06/23/22 0521 06/24/22 0046  WBC 12.3* 11.9* 8.6 7.5 7.6  HGB 10.9* 9.9* 9.6* 9.4* 9.9*  HCT 32.6* 30.0* 29.8* 29.6* 29.5*  MCV 81.1 82.0 81.9 82.9 80.6  PLT 316 328 331 308 327    Basic Metabolic Panel: Recent Labs  Lab 06/18/22 0313 06/20/22 0104 06/22/22 0613 06/23/22 0521 06/24/22 0046  NA 131* 134* 132* 133* 132*  K 4.6 5.0 4.6 5.1 4.7  CL 98 100 98 102 99  CO2 24 27 25 27 24   GLUCOSE 148* 134* 136* 88 129*  BUN 19 27* 32* 33* 30*  CREATININE 1.36* 1.45* 1.49* 1.51* 1.54*  CALCIUM 8.2* 8.2* 8.3*  8.4* 8.4*    GFR: Estimated Creatinine Clearance: 45.6 mL/min (Kirti Carl) (by C-G formula based on SCr of 1.54 mg/dL (H)).  Liver Function Tests: No results for input(s): "AST", "ALT", "ALKPHOS", "BILITOT", "PROT", "ALBUMIN" in the last 168 hours.  CBG: Recent Labs  Lab 06/23/22 2019 06/24/22 0608 06/24/22 0735 06/24/22 0925 06/24/22 1202  GLUCAP 106* 138* 141* 119* 123*     Recent Results (from the past 240 hour(s))  Surgical pcr screen     Status: None   Collection Time: 06/16/22  9:23 PM   Specimen: Nasal Mucosa; Nasal Swab  Result Value Ref Range Status   MRSA, PCR NEGATIVE NEGATIVE Final   Staphylococcus aureus NEGATIVE NEGATIVE Final    Comment: (NOTE) The Xpert SA Assay (FDA approved for NASAL specimens in patients 29 years of age and older), is one component of [facility] comprehensive surveillance program. It is not intended to diagnose infection nor to guide or monitor treatment. Performed at [facility], [address] [place], [place] [address]          Radiology Studies: DG CHEST PORT 1 VIEW  Result Date: 06/23/2022 CLINICAL DATA:  Shortness of breath, now improved EXAM: PORTABLE CHEST 1 VIEW COMPARISON:  Chest radiograph dated 05/29/2022 FINDINGS: Normal lung volumes. No focal consolidations. No pleural effusion or pneumothorax. Similar mildly enlarged cardiomediastinal silhouette status post valve replacement. The visualized  skeletal structures are unremarkable. IMPRESSION: No active disease. Electronically Signed   By: Agustin Cree M.D.   On: 06/23/2022 19:51        Scheduled Meds:  amoxicillin-clavulanate  1 tablet Oral Q12H   vitamin C  1,000 mg Oral Daily   aspirin  81 mg Oral Daily   atorvastatin  40 mg Oral Daily   carvedilol  12.5 mg Oral BID WC   Chlorhexidine Gluconate Cloth  6 each Topical Daily   cholecalciferol  1,000 Units Oral Daily   cyanocobalamin  500 mcg Oral q AM   [START ON 06/25/2022] docusate sodium  100 mg Oral Daily   doxycycline  100 mg Oral BID PC   fentaNYL       ferrous sulfate  325 mg Oral Q breakfast   furosemide  80 mg Oral Daily   hydrALAZINE  25 mg Oral TID   insulin aspart  0-15 Units Subcutaneous TID WC   insulin aspart  0-5 Units Subcutaneous QHS   insulin detemir  5 Units Subcutaneous BID   isosorbide mononitrate  30 mg Oral Daily   nutrition supplement (JUVEN)  1 packet Oral BID BM   pantoprazole  40 mg Oral Daily   sacubitril-valsartan  1 tablet Oral BID   zinc sulfate  220 mg Oral Daily   Continuous Infusions:  sodium chloride     sodium chloride     heparin 1,700 Units/hr (06/24/22 1044)   magnesium sulfate bolus IVPB       LOS: 17 days    Time spent: over 30 min    Lacretia Nicks, MD [facility]   To contact the attending provider between 7A-7P or the covering provider during after hours 7P-7A, please log into the web site [email or web address] and access using universal [place] password for that web site. If you do not have the password, please call the hospital operator.  06/24/2022, 3:30 PM

## 2022-06-24 NOTE — Progress Notes (Signed)
Orthopedic Tech Progress Note Patient Details:  Caleb [name] 1950-09-13 [id number]  Ortho Devices Type of Ortho Device: Postop shoe/boot Ortho Device/Splint Interventions: Application, Ordered, Adjustment   Post Interventions Patient Tolerated: Well Instructions Provided: Care of device, Adjustment of device  [place] A [name] 06/24/2022, 11:00 AM

## 2022-06-24 NOTE — Anesthesia Procedure Notes (Signed)
Procedure Name: LMA Insertion Date/Time: 06/24/2022 8:45 AM  Performed by: Caren Macadam, CRNAPre-anesthesia Checklist: Patient identified, Emergency Drugs available, Suction available and Patient being monitored Patient Re-evaluated:Patient Re-evaluated prior to induction Oxygen Delivery Method: Circle system utilized Preoxygenation: Pre-oxygenation with 100% oxygen Induction Type: IV induction Ventilation: Mask ventilation without difficulty LMA: LMA inserted LMA Size: 4.0 Number of attempts: 1 Placement Confirmation: positive ETCO2 and breath sounds checked- equal and bilateral Tube secured with: Tape Dental Injury: Teeth and Oropharynx as per pre-operative assessment

## 2022-06-24 NOTE — Progress Notes (Signed)
ANTICOAGULATION CONSULT NOTE  Pharmacy Consult for heparin  Indication: critical limb ischemia   Allergies  Allergen Reactions   Codeine Nausea And Vomiting   Norco [Hydrocodone-Acetaminophen] Nausea And Vomiting   Percocet [Oxycodone-Acetaminophen] Nausea And Vomiting   Tramadol Hcl Nausea And Vomiting    Patient Measurements: Height: 5\' 7"  (170.2 cm) Weight: 88 kg (194 lb 1.6 oz) IBW/kg (Calculated) : 66.1     Vital Signs: Temp: 97.8 F (36.6 C) (04/04 2320) Temp Source: Oral (04/04 2320) BP: 106/70 (04/04 2320) Pulse Rate: 80 (04/04 2320)  Labs: Recent Labs    06/22/22 0613 06/22/22 1729 06/23/22 0521 06/23/22 1756 06/24/22 0046  HGB 9.6*  --  9.4*  --  9.9*  HCT 29.8*  --  29.6*  --  29.5*  PLT 331  --  308  --  327  HEPARINUNFRC  --    < > 0.14* <0.10* 0.26*  CREATININE 1.49*  --  1.51*  --  1.54*   < > = values in this interval not displayed.     Estimated Creatinine Clearance: 45.9 mL/min (A) (by C-G formula based on SCr of 1.54 mg/dL (H)).   Medical History: Past Medical History:  Diagnosis Date   Carotid artery occlusion    Diabetes mellitus without complication (HCC)    Type II   Dyslipidemia 09/27/2012   Erectile dysfunction 09/27/2012   GERD (gastroesophageal reflux disease)    Hypercholesteremia    Hyperlipidemia 08/12/2012   Hypertension    Hyponatremia 08/14/2012   Neuropathy    Pancreatitis    Pancreatitis, acute 09/27/2012   Peripheral vascular disease (HCC)    S/P 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 Excell Seltzer & Bartle   Severe aortic stenosis    Vitamin D deficiency 06/23/2015      Assessment: 84 you male with critical limb ischemia s/p R CFA endarterectomy with femoral to ATA bypass on 3/30 and has been on low dose heparin. Pharmacy consulted to change to full dose. Plans noted for R heel debridement on Friday (R heel osteo).  -current heparin  rate= 500 units/hr -hg= 9.6  4/5 AM update:  Heparin level sub-therapeutic   Goal of Therapy:  Heparin level 0.3-0.7 units/ml Monitor platelets by anticoagulation protocol: Yes  Plan: Inc heparin to 1700 units/hr 1100 heparin level  Abran Duke, PharmD, BCPS Clinical Pharmacist Phone: [phone]

## 2022-06-24 NOTE — Progress Notes (Signed)
ANTICOAGULATION CONSULT NOTE  Pharmacy Consult for heparin  Indication: critical limb ischemia   Allergies  Allergen Reactions   Codeine Nausea And Vomiting   Norco [Hydrocodone-Acetaminophen] Nausea And Vomiting   Percocet [Oxycodone-Acetaminophen] Nausea And Vomiting   Tramadol Hcl Nausea And Vomiting    Patient Measurements: Height: 5\' 7"  (170.2 cm) Weight: 86.7 kg (191 lb 2.2 oz) IBW/kg (Calculated) : 66.1 HEPARIN DW (KG): 83.8   Vital Signs: Temp: 97.8 F (36.6 C) (04/05 1015) Temp Source: Oral (04/05 1015) BP: 118/72 (04/05 1043) Pulse Rate: 80 (04/05 1043)  Labs: Recent Labs    06/22/22 0613 06/22/22 1729 06/23/22 0521 06/23/22 1756 06/24/22 0046  HGB 9.6*  --  9.4*  --  9.9*  HCT 29.8*  --  29.6*  --  29.5*  PLT 331  --  308  --  327  HEPARINUNFRC  --    < > 0.14* <0.10* 0.26*  CREATININE 1.49*  --  1.51*  --  1.54*   < > = values in this interval not displayed.     Estimated Creatinine Clearance: 45.6 mL/min (A) (by C-G formula based on SCr of 1.54 mg/dL (H)).   Medical History: Past Medical History:  Diagnosis Date   Carotid artery occlusion    Diabetes mellitus without complication    Type II   Dyslipidemia 09/27/2012   Erectile dysfunction 09/27/2012   GERD (gastroesophageal reflux disease)    Hypercholesteremia    Hyperlipidemia 08/12/2012   Hypertension    Hyponatremia 08/14/2012   Neuropathy    Pancreatitis    Pancreatitis, acute 09/27/2012   Peripheral vascular disease    S/P 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 Excell Seltzer & Bartle   Severe aortic stenosis    Vitamin D deficiency 06/23/2015      Assessment: 57 you male with critical limb ischemia s/p R CFA endarterectomy with femoral to ATA bypass on 3/30. Pharmacy dosing heparin  He is now s/p  R heel debridement and heparin was restarted ~ 11am -current heparin rate= 1700 units/hr   Goal of  Therapy:  Heparin level 0.3-0.7 units/ml Monitor platelets by anticoagulation protocol: Yes  Plan: -Continue heparin 1700 units/hr -Heparin level in 8 hours and daily wth CBC daily  Harland German, PharmD Clinical Pharmacist **Pharmacist phone directory can now be found on [email or web address] ([email or web address]).  Listed under [facility].

## 2022-06-24 NOTE — Transfer of Care (Signed)
Immediate Anesthesia Transfer of Care Note  Patient: Caleb [name]  Procedure(s) Performed: RIGHT PARTIAL CALCANEUS EXCISION (Right)  Patient Location: PACU  Anesthesia Type:General  Level of Consciousness: drowsy  Airway & Oxygen Therapy: Patient Spontanous Breathing and Patient connected to face mask oxygen  Post-op Assessment: Report given to RN and Post -op Vital signs reviewed and stable  Post vital signs: Reviewed and stable  Last Vitals:  Vitals Value Taken Time  BP 141/103 06/24/22 0922  Temp    Pulse 82 06/24/22 0921  Resp 0 06/24/22 0923  SpO2 100 % 06/24/22 0921  Vitals shown include unvalidated device data.  Last Pain:  Vitals:   06/24/22 0800  TempSrc:   PainSc: 0-No pain      Patients Stated Pain Goal: 0 (06/24/22 0340)  Complications: No notable events documented.

## 2022-06-24 NOTE — Progress Notes (Signed)
[facility]    Date of Admission:  06/07/2022   Total days of antibiotics  since 3/19  ID: Caleb [name] is a 72 y.o. male with  Principal Problem:   Acute osteomyelitis of right calcaneus Active Problems:   HTN (hypertension)   Hyperlipidemia   Hyponatremia   Dyslipidemia   Type 2 diabetes mellitus with hyperlipidemia   Acute kidney injury superimposed on chronic kidney disease   PAD (peripheral artery disease)   S/P TAVR (transcatheter aortic valve replacement)   Decubitus ulcer of right heel   Diabetic foot ulcer   Gastroenteritis    Subjective: Afebrile. Had some post op pain but now improved. Underwent right calcaneal debridement and has wound vac  Medications:   amoxicillin-clavulanate  1 tablet Oral Q12H   vitamin C  1,000 mg Oral Daily   aspirin  81 mg Oral Daily   atorvastatin  40 mg Oral Daily   carvedilol  12.5 mg Oral BID WC   Chlorhexidine Gluconate Cloth  6 each Topical Daily   cholecalciferol  1,000 Units Oral Daily   cyanocobalamin  500 mcg Oral q AM   [START ON 06/25/2022] docusate sodium  100 mg Oral Daily   doxycycline  100 mg Oral BID PC   fentaNYL       ferrous sulfate  325 mg Oral Q breakfast   furosemide  80 mg Oral Daily   hydrALAZINE  25 mg Oral TID   insulin aspart  0-15 Units Subcutaneous TID WC   insulin aspart  0-5 Units Subcutaneous QHS   insulin aspart  3 Units Subcutaneous TID WC   insulin detemir  5 Units Subcutaneous BID   isosorbide mononitrate  30 mg Oral Daily   nutrition supplement (JUVEN)  1 packet Oral BID BM   pantoprazole  40 mg Oral Daily   sacubitril-valsartan  1 tablet Oral BID   zinc sulfate  220 mg Oral Daily    Objective: Vital signs in last 24 hours: Temp:  [97.6 F (36.4 C)-99.5 F (37.5 C)] 97.6 F (36.4 C) (04/05 1438) Pulse Rate:  [73-90] 87 (04/05 1438) Resp:  [8-20] 18 (04/05 1438) BP: (103-141)/(65-103) 111/88 (04/05 1438) SpO2:  [82 %-100 %] 99 % (04/05 1438) Weight:   [86.7 kg] 86.7 kg (04/05 0747)  Physical Exam  Constitutional: He is oriented to person, place, and time. He appears well-developed and well-nourished. No distress.  HENT:  Mouth/Throat: Oropharynx is clear and moist. No oropharyngeal exudate.  Cardiovascular: Normal rate, regular rhythm and normal heart sounds. Exam reveals no gallop and no friction rub.  No murmur heard.  Pulmonary/Chest: Effort normal and breath sounds normal. No respiratory distress. He has no wheezes.  Abdominal: Soft. Bowel sounds are normal. He exhibits no distension. There is no tenderness.  Neurological: He is alert and oriented to person, place, and time.  Skin: right leg photos enclosed Psychiatric: He has a normal mood and affect. His behavior is normal.    Lab Results Recent Labs    06/23/22 0521 06/24/22 0046  WBC 7.5 7.6  HGB 9.4* 9.9*  HCT 29.6* 29.5*  NA 133* 132*  K 5.1 4.7  CL 102 99  CO2 27 24  BUN 33* 30*  CREATININE 1.51* 1.54*   Lab Results  Component Value Date   ESRSEDRATE 92 (H) 06/09/2022    Microbiology: reviewed Studies/Results: DG CHEST PORT 1 VIEW  Result Date: 06/23/2022 CLINICAL DATA:  Shortness of breath, now improved EXAM: PORTABLE  CHEST 1 VIEW COMPARISON:  Chest radiograph dated 05/29/2022 FINDINGS: Normal lung volumes. No focal consolidations. No pleural effusion or pneumothorax. Similar mildly enlarged cardiomediastinal silhouette status post valve replacement. The visualized skeletal structures are unremarkable. IMPRESSION: No active disease. Electronically Signed   By: Agustin Cree M.D.   On: 06/23/2022 19:51     Assessment/Plan: Right calcaneal osteomyelitis = continue on amox/clav 875mg  bid plus doxy 100mg  po bid plan for 6 wk. Will follow up on recent or cultures to see if this needs to change  PAD s/p bypass = incision looks good. Continue with current surgery recs  Dr Zenaida Niece dam to follow up on cultures over the weekend or Monday   We will arrange for follow up  appt in 3-4 wk  [facility] Pager: [phone]  06/24/2022, 4:29 PM

## 2022-06-24 NOTE — Plan of Care (Signed)
  Problem: Education: Goal: Knowledge of General Education information will improve Description Including pain rating scale, medication(s)/side effects and non-pharmacologic comfort measures Outcome: Progressing   Problem: Health Behavior/Discharge Planning: Goal: Ability to manage health-related needs will improve Outcome: Progressing   

## 2022-06-24 NOTE — Interval H&P Note (Signed)
History and Physical Interval Note:  06/24/2022 6:46 AM  Caleb [name]  has presented today for surgery, with the diagnosis of Osteomyelitis Right Calcaneus.  The various methods of treatment have been discussed with the patient and family. After consideration of risks, benefits and other options for treatment, the patient has consented to  Procedure(s): RIGHT PARTIAL CALCANEUS EXCISION (Right) as a surgical intervention.  The patient's history has been reviewed, patient examined, no change in status, stable for surgery.  I have reviewed the patient's chart and labs.  Questions were answered to the patient's satisfaction.     Nadara Mustard

## 2022-06-24 NOTE — Progress Notes (Addendum)
  Progress Note    06/24/2022 7:37 AM 7 Days Post-Op  Subjective:  no complaints this morning. Eager to have his surgery by Dr. Lajoyce Corners this morning. Remains very optimistic about healing   Vitals:   06/23/22 2320 06/24/22 0304  BP: 106/70 121/85  Pulse: 80 85  Resp: 16 20  Temp: 97.8 F (36.6 C) 97.9 F (36.6 C)  SpO2: 97% 97%   Physical Exam: Cardiac:  regular Lungs:  non labored Incisions:  right groin, right thigh, and right lower leg incisions are all intact and well appearing Extremities:  right leg well perfused and warm with doppler AT signal. Right calf soft Abdomen:  soft Neurologic: alert and oriented  CBC    Component Value Date/Time   WBC 7.6 06/24/2022 0046   RBC 3.66 (L) 06/24/2022 0046   HGB 9.9 (L) 06/24/2022 0046   HGB 14.6 03/09/2022 1233   HGB 13.3 11/15/2019 1139   HCT 29.5 (L) 06/24/2022 0046   HCT 40.2 11/15/2019 1139   PLT 327 06/24/2022 0046   PLT 216 03/09/2022 1233   PLT 300 11/15/2019 1139   MCV 80.6 06/24/2022 0046   MCV 86 11/15/2019 1139   MCH 27.0 06/24/2022 0046   MCHC 33.6 06/24/2022 0046   RDW 16.4 (H) 06/24/2022 0046   RDW 14.5 11/15/2019 1139   LYMPHSABS 1.7 06/16/2022 0057   LYMPHSABS 3.3 (H) 09/28/2017 1113   MONOABS 1.3 (H) 06/16/2022 0057   EOSABS 0.6 (H) 06/16/2022 0057   EOSABS 0.3 09/28/2017 1113   BASOSABS 0.0 06/16/2022 0057   BASOSABS 0.0 09/28/2017 1113    BMET    Component Value Date/Time   NA 132 (L) 06/24/2022 0046   NA 142 02/16/2022 1544   K 4.7 06/24/2022 0046   CL 99 06/24/2022 0046   CO2 24 06/24/2022 0046   GLUCOSE 129 (H) 06/24/2022 0046   BUN 30 (H) 06/24/2022 0046   BUN 22 02/16/2022 1544   CREATININE 1.54 (H) 06/24/2022 0046   CREATININE 1.36 (H) 03/09/2022 1233   CREATININE 0.86 06/06/2016 1455   CALCIUM 8.4 (L) 06/24/2022 0046   GFRNONAA 48 (L) 06/24/2022 0046   GFRNONAA 56 (L) 03/09/2022 1233   GFRNONAA 87 04/02/2013 1632   GFRAA >60 12/17/2019 0354   GFRAA >89 04/02/2013 1632     INR    Component Value Date/Time   INR 1.0 01/11/2022 0957     Intake/Output Summary (Last 24 hours) at 06/24/2022 0737 Last data filed at 06/24/2022 0720 Gross per 24 hour  Intake 628.33 ml  Output 1700 ml  Net -1071.67 ml     Assessment/Plan:  72 y.o. male is s/p R CFA endarterectomy with femoral to ATA bypass with vein  7 Days Post-Op   R foot well perfused with Doppler AT signal Incisions are all intact and well appearing For debridement of R heel by Dr. Lajoyce Corners this morning in OR Will defer to instructions by Dr. Lajoyce Corners regarding weight bearing post operatively Will need to elevate Right leg to help with edema   Graceann Congress, PA-C [facility] [phone] 06/24/2022 7:37 AM  VASCULAR STAFF ADDENDUM:  I agree with the above.    Fara Olden, MD [facility] Phone Number: [phone] 06/24/2022 5:47 PM

## 2022-06-24 NOTE — Anesthesia Preprocedure Evaluation (Signed)
Anesthesia Evaluation  Patient identified by MRN, date of birth, ID band Patient awake    Reviewed: Allergy & Precautions, NPO status , Patient's Chart, lab work & pertinent test results  History of Anesthesia Complications Negative for: history of anesthetic complications  Airway Mallampati: I  TM Distance: >3 FB Neck ROM: Full    Dental  (+) Missing, Dental Advisory Given   Pulmonary neg pulmonary ROS   breath sounds clear to auscultation       Cardiovascular hypertension, Pt. on medications and Pt. on home beta blockers pulmonary hypertension(-) angina + Peripheral Vascular Disease and +CHF (Entresto)  + Valvular Problems/Murmurs (s/p TAVR)  Rhythm:Regular Rate:Normal  05/28/2022 ECHO: EF 25-30%, severely decreased LVF, mild LVH, Grade 2 DD, mild RVF, S/p TAVR, mild MR   Neuro/Psych   Anxiety        GI/Hepatic Neg liver ROS,neg GERD  ,,H/o pancreatitis   Endo/Other  diabetes, Insulin Dependent    Renal/GU Renal InsufficiencyRenal disease     Musculoskeletal   Abdominal   Peds  Hematology  (+) Blood dyscrasia (Hb 11.6, plt 348K), anemia plavix   Anesthesia Other Findings   Reproductive/Obstetrics                             Anesthesia Physical Anesthesia Plan  ASA: 4  Anesthesia Plan: General   Post-op Pain Management: Tylenol PO (pre-op)*   Induction: Intravenous  PONV Risk Score and Plan: 2 and Ondansetron and Dexamethasone  Airway Management Planned: LMA and Oral ETT  Additional Equipment: None  Intra-op Plan:   Post-operative Plan: Extubation in OR  Informed Consent: I have reviewed the patients History and Physical, chart, labs and discussed the procedure including the risks, benefits and alternatives for the proposed anesthesia with the patient or authorized representative who has indicated his/her understanding and acceptance.     Dental advisory given  Plan  Discussed with: CRNA and Anesthesiologist  Anesthesia Plan Comments:         Anesthesia Quick Evaluation

## 2022-06-24 NOTE — Progress Notes (Signed)
PT Cancellation Note  Patient Details Name: Caleb [name] MRN: [id number] DOB: 1951/03/18   Cancelled Treatment:    Reason Eval/Treat Not Completed: (P) Patient at procedure or test/unavailable (pt off floor for procedure (PACU).) Pt may need re-eval post-op given new WB restrictions, supervising PT notified.   Dorathy Kinsman Starsky Nanna 06/24/2022, 9:40 AM

## 2022-06-24 NOTE — Plan of Care (Signed)
  Problem: Clinical Measurements: Goal: Respiratory complications will improve Outcome: Progressing   

## 2022-06-24 NOTE — Progress Notes (Signed)
ANTICOAGULATION CONSULT NOTE  Pharmacy Consult for heparin  Indication: critical limb ischemia   Allergies  Allergen Reactions   Codeine Nausea And Vomiting   Norco [Hydrocodone-Acetaminophen] Nausea And Vomiting   Percocet [Oxycodone-Acetaminophen] Nausea And Vomiting   Tramadol Hcl Nausea And Vomiting    Patient Measurements: Height: 5\' 7"  (170.2 cm) Weight: 86.7 kg (191 lb 2.2 oz) IBW/kg (Calculated) : 66.1 HEPARIN DW (KG): 83.8   Vital Signs: Temp: 98.4 F (36.9 C) (04/05 1948) Temp Source: Oral (04/05 1948) BP: 120/75 (04/05 1948) Pulse Rate: 90 (04/05 1948)  Labs: Recent Labs    06/22/22 0613 06/22/22 1729 06/23/22 0521 06/23/22 1756 06/24/22 0046 06/24/22 1125 06/24/22 2014  HGB 9.6*  --  9.4*  --  9.9*  --   --   HCT 29.8*  --  29.6*  --  29.5*  --   --   PLT 331  --  308  --  327  --   --   HEPARINUNFRC  --    < > 0.14*   < > 0.26* <0.10* 0.24*  CREATININE 1.49*  --  1.51*  --  1.54*  --   --    < > = values in this interval not displayed.     Estimated Creatinine Clearance: 45.6 mL/min (A) (by C-G formula based on SCr of 1.54 mg/dL (H)).   Medical History: Past Medical History:  Diagnosis Date   Carotid artery occlusion    Diabetes mellitus without complication    Type II   Dyslipidemia 09/27/2012   Erectile dysfunction 09/27/2012   GERD (gastroesophageal reflux disease)    Hypercholesteremia    Hyperlipidemia 08/12/2012   Hypertension    Hyponatremia 08/14/2012   Neuropathy    Pancreatitis    Pancreatitis, acute 09/27/2012   Peripheral vascular disease    S/P 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 Excell Seltzer & Bartle   Severe aortic stenosis    Vitamin D deficiency 06/23/2015      Assessment: 6 you male with critical limb ischemia s/p R CFA endarterectomy with femoral to ATA bypass on 3/30. Pharmacy dosing heparin  Heparin level 0.24 units/mL  (subtherapeutic) on heparin 1700 units/hr. No signs of bleeding.  Goal of Therapy:  Heparin level 0.3-0.7 units/ml Monitor platelets by anticoagulation protocol: Yes  Plan: Increase heparin to 1800 units/hr Check heparin level with AM labs  Eldridge Scot, PharmD Clinical Pharmacist

## 2022-06-24 NOTE — Op Note (Signed)
06/24/2022  9:29 AM  PATIENT:  Caleb [name]    PRE-OPERATIVE DIAGNOSIS:  Osteomyelitis Right Calcaneus with gangrenous right heel ulcer  POST-OPERATIVE DIAGNOSIS:  Same  PROCEDURE:  RIGHT PARTIAL CALCANEUS EXCISION, excision of skin and soft tissue right heel.  Application of Kerecis micro graft 38 cm. Application of 13 cm Prevena wound VAC sponge.  SURGEON:  Nadara Mustard, MD  PHYSICIAN ASSISTANT:None ANESTHESIA:   General  PREOPERATIVE INDICATIONS:  Caleb [name] is a  72 y.o. male with a diagnosis of Osteomyelitis Right Calcaneus who failed conservative measures and elected for surgical management.    The risks benefits and alternatives were discussed with the patient preoperatively including but not limited to the risks of infection, bleeding, nerve injury, cardiopulmonary complications, the need for revision surgery, among others, and the patient was willing to proceed.  OPERATIVE IMPLANTS:   Implant Name Type Inv. Item Serial No. Manufacturer Lot No. LRB No. Used Action  GRAFT SKIN MARIGEN MICRO 38 - [id number] Tissue GRAFT SKIN MARIGEN MICRO 38  [employer] [id number] Right 1 Implanted    @ENCIMAGES @  OPERATIVE FINDINGS: Patient had full-thickness necrotic ulceration to the calcaneus.  The tissue was sent for cultures.  Patient will need 6 weeks of antibiotic coverage pending culture sensitivities.  OPERATIVE PROCEDURE: Patient was brought the operating room and underwent a general anesthetic.  After adequate levels anesthesia were obtained patient's right lower extremity was prepped using DuraPrep draped into a sterile field a timeout was called.  An elliptical incision was made around the gangrenous ulcer right calcaneus.  The necrotic tissue was sent for cultures.  The wound margins were clear.  Patient had ulceration extended down to the calcaneus and a partial excision of the calcaneus was performed with an oscillating saw.  There was healthy viable bone at the  margin.  The wound was irrigated with normal saline electrocardio was used hemostasis.  The wound bed was filled with Kerecis micro graft 38 cm.  The incision was closed using 2-0 nylon.  A Prevena 13 cm wound VAC was applied this had a good suction fit patient was extubated taken the PACU in stable condition.   DISCHARGE PLANNING:  Antibiotic duration: Continue IV will adjust antibiotics pending culture sensitivities.  Patient will need 6 weeks of antibiotic coverage.  Weightbearing: Nonweightbearing on the right patient to wear the PRAFO 24 hours a day.  Pain medication: Opioid pathway  Dressing care/ Wound VAC: Continue wound VAC for 1 week  Ambulatory devices: Walker or crutches  Discharge to: Discharge planning based on therapy recommendations.  Follow-up: In the office 1 week post operative.

## 2022-06-25 DIAGNOSIS — M86171 Other acute osteomyelitis, right ankle and foot: Secondary | ICD-10-CM | POA: Diagnosis not present

## 2022-06-25 LAB — COMPREHENSIVE METABOLIC PANEL
ALT: 11 U/L (ref 0–44)
AST: 16 U/L (ref 15–41)
Albumin: 2.3 g/dL — ABNORMAL LOW (ref 3.5–5.0)
Alkaline Phosphatase: 91 U/L (ref 38–126)
Anion gap: 11 (ref 5–15)
BUN: 32 mg/dL — ABNORMAL HIGH (ref 8–23)
CO2: 23 mmol/L (ref 22–32)
Calcium: 8.5 mg/dL — ABNORMAL LOW (ref 8.9–10.3)
Chloride: 98 mmol/L (ref 98–111)
Creatinine, Ser: 1.65 mg/dL — ABNORMAL HIGH (ref 0.61–1.24)
GFR, Estimated: 44 mL/min — ABNORMAL LOW (ref >60)
Glucose, Bld: 150 mg/dL — ABNORMAL HIGH (ref 70–99)
Potassium: 5 mmol/L (ref 3.5–5.1)
Sodium: 132 mmol/L — ABNORMAL LOW (ref 135–145)
Total Bilirubin: 0.6 mg/dL (ref 0.3–1.2)
Total Protein: 6.7 g/dL (ref 6.5–8.1)

## 2022-06-25 LAB — AEROBIC/ANAEROBIC CULTURE W GRAM STAIN (SURGICAL/DEEP WOUND)

## 2022-06-25 LAB — HEPARIN LEVEL (UNFRACTIONATED)
Heparin Unfractionated: 0.43 IU/mL (ref 0.30–0.70)
Heparin Unfractionated: 0.35 IU/mL (ref 0.30–0.70)

## 2022-06-25 LAB — CBC
HCT: 30.1 % — ABNORMAL LOW (ref 39.0–52.0)
Hemoglobin: 10.1 g/dL — ABNORMAL LOW (ref 13.0–17.0)
MCH: 27.1 pg (ref 26.0–34.0)
MCHC: 33.6 g/dL (ref 30.0–36.0)
MCV: 80.7 fL (ref 80.0–100.0)
Platelets: 348 10*3/uL (ref 150–400)
RBC: 3.73 MIL/uL — ABNORMAL LOW (ref 4.22–5.81)
RDW: 16.5 % — ABNORMAL HIGH (ref 11.5–15.5)
WBC: 11.9 10*3/uL — ABNORMAL HIGH (ref 4.0–10.5)
nRBC: 0 % (ref 0.0–0.2)

## 2022-06-25 LAB — CBC WITH DIFFERENTIAL/PLATELET
Abs Immature Granulocytes: 0.03 10*3/uL (ref 0.00–0.07)
Basophils Absolute: 0 10*3/uL (ref 0.0–0.1)
Basophils Relative: 0 %
Eosinophils Absolute: 0.1 10*3/uL (ref 0.0–0.5)
Eosinophils Relative: 1 %
HCT: 32.1 % — ABNORMAL LOW (ref 39.0–52.0)
Hemoglobin: 10.5 g/dL — ABNORMAL LOW (ref 13.0–17.0)
Immature Granulocytes: 0 %
Lymphocytes Relative: 13 %
Lymphs Abs: 1.7 10*3/uL (ref 0.7–4.0)
MCH: 26.6 pg (ref 26.0–34.0)
MCHC: 32.7 g/dL (ref 30.0–36.0)
MCV: 81.5 fL (ref 80.0–100.0)
Monocytes Absolute: 1.3 10*3/uL — ABNORMAL HIGH (ref 0.1–1.0)
Monocytes Relative: 10 %
Neutro Abs: 9.7 10*3/uL — ABNORMAL HIGH (ref 1.7–7.7)
Neutrophils Relative %: 76 %
Platelets: 352 10*3/uL (ref 150–400)
RBC: 3.94 MIL/uL — ABNORMAL LOW (ref 4.22–5.81)
RDW: 16.7 % — ABNORMAL HIGH (ref 11.5–15.5)
WBC: 12.8 10*3/uL — ABNORMAL HIGH (ref 4.0–10.5)
nRBC: 0 % (ref 0.0–0.2)

## 2022-06-25 LAB — BASIC METABOLIC PANEL
Anion gap: 10 (ref 5–15)
BUN: 35 mg/dL — ABNORMAL HIGH (ref 8–23)
CO2: 25 mmol/L (ref 22–32)
Calcium: 8.5 mg/dL — ABNORMAL LOW (ref 8.9–10.3)
Chloride: 96 mmol/L — ABNORMAL LOW (ref 98–111)
Creatinine, Ser: 1.61 mg/dL — ABNORMAL HIGH (ref 0.61–1.24)
GFR, Estimated: 45 mL/min — ABNORMAL LOW (ref >60)
Glucose, Bld: 152 mg/dL — ABNORMAL HIGH (ref 70–99)
Potassium: 4.2 mmol/L (ref 3.5–5.1)
Sodium: 131 mmol/L — ABNORMAL LOW (ref 135–145)

## 2022-06-25 LAB — GLUCOSE, CAPILLARY
Glucose-Capillary: 146 mg/dL — ABNORMAL HIGH (ref 70–99)
Glucose-Capillary: 141 mg/dL — ABNORMAL HIGH (ref 70–99)
Glucose-Capillary: 186 mg/dL — ABNORMAL HIGH (ref 70–99)
Glucose-Capillary: 99 mg/dL (ref 70–99)
Glucose-Capillary: 183 mg/dL — ABNORMAL HIGH (ref 70–99)

## 2022-06-25 LAB — PHOSPHORUS: Phosphorus: 3.8 mg/dL (ref 2.5–4.6)

## 2022-06-25 LAB — MAGNESIUM: Magnesium: 1.7 mg/dL (ref 1.7–2.4)

## 2022-06-25 MED ORDER — FUROSEMIDE 40 MG PO TABS
80.0000 mg | ORAL_TABLET | Freq: Two times a day (BID) | ORAL | Status: DC
  Administered 2022-06-25 – 2022-06-30 (×11): 80 mg via ORAL
  Filled 2022-06-25 (×11): qty 2

## 2022-06-25 MED ORDER — ORAL CARE MOUTH RINSE: 15.0000 mL | OROMUCOSAL | Status: DC | PRN

## 2022-06-25 NOTE — Progress Notes (Addendum)
PROGRESS NOTE    Caleb [name]  TJQ:[id number] DOB: 03-17-1951 DOA: 06/07/2022 PCP: Caleb Register, MD  Chief Complaint  Patient presents with   Wound Check   Foot Pain    Brief Narrative:   Caleb [name] is Caleb [name] 72 year old man with PVD, DM 2, HTN, AAS s/p TAVR, HFrEF with Caleb [name] EF of 35% send CKD 3 was admitted with recurrent diabetic foot infection and acute on chronic kidney injury. Workup has revealed acute osteomyelitis in the posterolateral aspect of the calcaneus. Seen by ID and per their recommendations, he is on doxycycline/Augmentin pending surgery/bone biopsy per ID recommendations, for which he is potentially scheduled on 06/22/2022 with Dr. Lajoyce [name]. Patient is being seen by vascular surgery , Status post angiogram 3/25, with right femoral-tibial bypass graft. Did well postop.   Assessment & Plan:   Principal Problem:   Acute osteomyelitis of right calcaneus Active Problems:   Type 2 diabetes mellitus with hyperlipidemia   Acute kidney injury superimposed on chronic kidney disease   HTN (hypertension)   Hyperlipidemia   Hyponatremia   Dyslipidemia   PAD (peripheral artery disease)   S/P TAVR (transcatheter aortic valve replacement)   Decubitus ulcer of right heel   Diabetic foot ulcer   Gastroenteritis  Sepsis secondary to acute osteomyelitis right heel and PVD  -Sepsis POA  -Appreciate infectious disease -Antibiotics per ID, continue doxycycline, Augmentin -Aspirin, Plavix, lipitor  -Status post angiogram 3/25.  Status post femoral-tibial bypass.  Continue heparin, will need doac at discharge (will message Dr. Lajoyce [name] regarding timing of transition).   -s/p R partial calcaneus excision, excision of skin and soft tissue R heel  - pending surgical cultures (rare gram positive cocci)   AKI on CKD stage IIIb, resolved -Baseline Cr 1.5-2.5 -Appreciate nephrology -Thought to be secondary to hypovolemia while on Entresto.  Caleb [name] was resumed on 06/13/2022.  He is getting  intermittent IV Lasix.  Scheduled oral Lasix resumed on 06/20/2022 -> increase to 80 mg daily, more like his home regimen. -Nephrology signed off 3/22.  Follow-up with Dr. Marisue [name] later this month -Improving   HFrEF Exacerbation  Volume Overload Has LE edema, will adjust oral lasix  Strict I/O, daily weights Continue entresto Hydralazine, imdur  Will monitor, BP's soft today  Hypertension BP soft today Follow on imdur, entresto, hydral, lasix  SOB - CXR without active disease, no complaints today - continue lasix as above  Hyperkalemia -Resolved   Diabetes mellitus type 2 with hyperglycemia Hold home 70/30 while inpatient.  Continue basal and mealtime with SSI.      DVT prophylaxis: heparin gtt Code Status: full Family Communication: none Disposition:   Status is: Inpatient Remains inpatient appropriate because: pending ortho procedure    Consultants:  Vascular ID ortho  Procedures:   4/5 RIGHT PARTIAL CALCANEUS EXCISION, excision of skin and soft tissue right heel.   3/25 Conscious sedation Ultrasound-guided access to the left common femoral artery CO2 aortogram and iliac arteriogram Selective catheterization of the right external iliac artery (second-order catheterization) with right lower extremity runoff Mynx closure of the left common femoral artery   Antimicrobials:  Anti-infectives (From admission, onward)    Start     Dose/Rate Route Frequency Ordered Stop   06/24/22 1300  ceFAZolin (ANCEF) IVPB 2g/100 mL premix        2 g 200 mL/hr over 30 Minutes Intravenous Every 8 hours 06/24/22 1016 06/24/22 1330   06/24/22 0600  ceFAZolin (ANCEF) IVPB 2g/100 mL premix  2 g 200 mL/hr over 30 Minutes Intravenous On call to O.R. 06/23/22 1747 06/24/22 0841   06/21/22 1000  amoxicillin-clavulanate (AUGMENTIN) 875-125 MG per tablet 1 tablet        1 tablet Oral Every 12 hours 06/21/22 0657     06/17/22 0740  ceFAZolin (ANCEF) 2-4 GM/100ML-% IVPB        Note to Pharmacy: Caleb [name]: cabinet override      06/17/22 0740 06/17/22 1944   06/16/22 1800  doxycycline (VIBRA-TABS) tablet 100 mg        100 mg Oral 2 times daily after meals 06/16/22 1600     06/09/22 1245  doxycycline (VIBRA-TABS) tablet 100 mg  Status:  Discontinued        100 mg Oral Every 12 hours 06/09/22 1145 06/16/22 1600   06/09/22 1245  amoxicillin-clavulanate (AUGMENTIN) 500-125 MG per tablet 1 tablet  Status:  Discontinued        1 tablet Oral 2 times daily 06/09/22 1145 06/21/22 0657   06/08/22 1600  cefTRIAXone (ROCEPHIN) 2 g in sodium chloride 0.9 % 100 mL IVPB  Status:  Discontinued        2 g 200 mL/hr over 30 Minutes Intravenous Every 24 hours 06/08/22 0852 06/09/22 1145   06/08/22 1200  DAPTOmycin (CUBICIN) 600 mg in sodium chloride 0.9 % IVPB  Status:  Discontinued        8 mg/kg  77.4 kg 124 mL/hr over 30 Minutes Intravenous Every 48 hours 06/08/22 1012 06/09/22 1145   06/07/22 1415  linezolid (ZYVOX) IVPB 600 mg  Status:  Discontinued        600 mg 300 mL/hr over 60 Minutes Intravenous Every 12 hours 06/07/22 1414 06/08/22 0852   06/07/22 1415  ceFEPIme (MAXIPIME) 2 g in sodium chloride 0.9 % 100 mL IVPB  Status:  Discontinued        2 g 200 mL/hr over 30 Minutes Intravenous Daily 06/07/22 1414 06/08/22 0852       Subjective: No new complaints  Objective: Vitals:   06/25/22 0727 06/25/22 0741 06/25/22 0800 06/25/22 1204  BP: 108/70  104/70 (!) 95/58  Pulse:    83  Resp: (!) 21   12  Temp:  (!) 97.5 F (36.4 C)  98.1 F (36.7 C)  TempSrc:  Axillary  Oral  SpO2:    100%  Weight:      Height:        Intake/Output Summary (Last 24 hours) at 06/25/2022 1312 Last data filed at 06/25/2022 0900 Gross per 24 hour  Intake 746.99 ml  Output 1752 ml  Net -1005.01 ml    Filed Weights   06/24/22 0304 06/24/22 0747 06/25/22 0500  Weight: 86.7 kg 86.7 kg 85.9 kg    Examination:  General: No acute distress. Cardiovascular: RRR Lungs:  unlabored Neurological: Alert and oriented 3. Moves all extremities 4. Cranial nerves II through XII grossly intact. Extremities: wound vac to RLE, LE edema    Data Reviewed: I have personally reviewed following labs and imaging studies  CBC: Recent Labs  Lab 06/22/22 0613 06/23/22 0521 06/24/22 0046 06/25/22 0706 06/25/22 1214  WBC 8.6 7.5 7.6 12.8* 11.9*  NEUTROABS  --   --   --  9.7*  --   HGB 9.6* 9.4* 9.9* 10.5* 10.1*  HCT 29.8* 29.6* 29.5* 32.1* 30.1*  MCV 81.9 82.9 80.6 81.5 80.7  PLT 331 308 327 352 348    Basic Metabolic Panel: Recent Labs  Lab 06/22/22  5003 06/23/22 0521 06/24/22 0046 06/25/22 0706 06/25/22 1214  NA 132* 133* 132* 132* 131*  K 4.6 5.1 4.7 5.0 4.2  CL 98 102 99 98 96*  CO2 25 27 24 23 25   GLUCOSE 136* 88 129* 150* 152*  BUN 32* 33* 30* 32* 35*  CREATININE 1.49* 1.51* 1.54* 1.65* 1.61*  CALCIUM 8.3* 8.4* 8.4* 8.5* 8.5*  MG  --   --   --  1.7  --   PHOS  --   --   --  3.8  --     GFR: Estimated Creatinine Clearance: 43.4 mL/min (Cameka Rae) (by C-G formula based on SCr of 1.61 mg/dL (H)).  Liver Function Tests: Recent Labs  Lab 06/25/22 0706  AST 16  ALT 11  ALKPHOS 91  BILITOT 0.6  PROT 6.7  ALBUMIN 2.3*    CBG: Recent Labs  Lab 06/24/22 1640 06/24/22 2114 06/25/22 0600 06/25/22 0819 06/25/22 1203  GLUCAP 120* 175* 146* 141* 186*     Recent Results (from the past 240 hour(s))  Surgical pcr screen     Status: None   Collection Time: 06/16/22  9:23 PM   Specimen: Nasal Mucosa; Nasal Swab  Result Value Ref Range Status   MRSA, PCR NEGATIVE NEGATIVE Final   Staphylococcus aureus NEGATIVE NEGATIVE Final    Comment: (NOTE) The Xpert SA Assay (FDA approved for NASAL specimens in patients 71 years of age and older), is one component of Trenia Tennyson comprehensive surveillance program. It is not intended to diagnose infection nor to guide or monitor treatment. Performed at [facility], [address], [place], [place] [address]    Aerobic/Anaerobic Culture w Gram Stain (surgical/deep wound)     Status: None (Preliminary result)   Collection Time: 06/24/22  8:55 AM   Specimen: Soft Tissue, Other  Result Value Ref Range Status   Specimen Description TISSUE  Final   Special Requests  CALCANEUS  Final   Gram Stain   Final    NO WBC SEEN RARE GRAM POSITIVE COCCI Performed at [facility], [address] [place], [place] [address]    Culture PENDING  Incomplete   Report Status PENDING  Incomplete         Radiology Studies: DG CHEST PORT 1 VIEW  Result Date: 06/23/2022 CLINICAL DATA:  Shortness of breath, now improved EXAM: PORTABLE CHEST 1 VIEW COMPARISON:  Chest radiograph dated 05/29/2022 FINDINGS: Normal lung volumes. No focal consolidations. No pleural effusion or pneumothorax. Similar mildly enlarged cardiomediastinal silhouette status post valve replacement. The visualized skeletal structures are unremarkable. IMPRESSION: No active disease. Electronically Signed   By: Agustin Cree [name].D.   On: 06/23/2022 19:51        Scheduled Meds:  amoxicillin-clavulanate  1 tablet Oral Q12H   vitamin C  1,000 mg Oral Daily   aspirin  81 mg Oral Daily   atorvastatin  40 mg Oral Daily   carvedilol  12.5 mg Oral BID WC   Chlorhexidine Gluconate Cloth  6 each Topical Daily   cholecalciferol  1,000 Units Oral Daily   cyanocobalamin  500 mcg Oral q AM   docusate sodium  100 mg Oral Daily   doxycycline  100 mg Oral BID PC   ferrous sulfate  325 mg Oral Q breakfast   furosemide  80 mg Oral Daily   hydrALAZINE  25 mg Oral TID   insulin aspart  0-15 Units Subcutaneous TID WC   insulin aspart  0-5 Units Subcutaneous QHS  insulin aspart  3 Units Subcutaneous TID WC   insulin detemir  5 Units Subcutaneous BID   isosorbide mononitrate  30 mg Oral Daily   nutrition supplement (JUVEN)  1 packet Oral BID BM   pantoprazole  40 mg Oral Daily   sacubitril-valsartan  1 tablet Oral BID   zinc sulfate  220 mg Oral Daily    Continuous Infusions:  sodium chloride     sodium chloride     heparin 1,800 Units/hr (06/24/22 2156)   magnesium sulfate bolus IVPB       LOS: 18 days    Time spent: over 30 min    Lacretia Nicks, MD [facility]   To contact the attending provider between 7A-7P or the covering provider during after hours 7P-7A, please log into the web site [email or web address] and access using universal [place] password for that web site. If you do not have the password, please call the hospital operator.  06/25/2022, 1:12 PM

## 2022-06-25 NOTE — Progress Notes (Signed)
  VASCULAR SURGERY ASSESSMENT & PLAN:   POD 8 RIGHT SFA-ATA BYPASS (VEIN): His bypass graft is patent.   VASCULAR QUALITY ISSUES: He is on aspirin and a statin.  OSTEOMYELITIS RIGHT CALCANEUS: Dr. Lajoyce Corners did a right partial calcaneus excision yesterday.  DVT PROPHYLAXIS: He is on subcu heparin.  SUBJECTIVE:   No complaints this morning.  PHYSICAL EXAM:   Vitals:   06/25/22 0325 06/25/22 0500 06/25/22 0727 06/25/22 0741  BP: 98/76  108/70   Pulse: 94     Resp: 16  (!) 21   Temp: 98.4 F (36.9 C)   (!) 97.5 F (36.4 C)  TempSrc: Oral   Axillary  SpO2: 99%     Weight:  85.9 kg    Height:       Brisk right DP with doppler His incisions look fine.   LABS:   Lab Results  Component Value Date   WBC 12.8 (H) 06/25/2022   HGB 10.5 (L) 06/25/2022   HCT 32.1 (L) 06/25/2022   MCV 81.5 06/25/2022   PLT 352 06/25/2022   Lab Results  Component Value Date   CREATININE 1.54 (H) 06/24/2022   Lab Results  Component Value Date   INR 1.0 01/11/2022   CBG (last 3)  Recent Labs    06/24/22 2114 06/25/22 0600 06/25/22 0819  GLUCAP 175* 146* 141*    PROBLEM LIST:    Principal Problem:   Acute osteomyelitis of right calcaneus Active Problems:   HTN (hypertension)   Hyperlipidemia   Hyponatremia   Dyslipidemia   Type 2 diabetes mellitus with hyperlipidemia   Acute kidney injury superimposed on chronic kidney disease   PAD (peripheral artery disease)   S/P TAVR (transcatheter aortic valve replacement)   Decubitus ulcer of right heel   Diabetic foot ulcer   Gastroenteritis   CURRENT MEDS:    amoxicillin-clavulanate  1 tablet Oral Q12H   vitamin C  1,000 mg Oral Daily   aspirin  81 mg Oral Daily   atorvastatin  40 mg Oral Daily   carvedilol  12.5 mg Oral BID WC   Chlorhexidine Gluconate Cloth  6 each Topical Daily   cholecalciferol  1,000 Units Oral Daily   cyanocobalamin  500 mcg Oral q AM   docusate sodium  100 mg Oral Daily   doxycycline  100 mg Oral BID PC    ferrous sulfate  325 mg Oral Q breakfast   furosemide  80 mg Oral Daily   hydrALAZINE  25 mg Oral TID   insulin aspart  0-15 Units Subcutaneous TID WC   insulin aspart  0-5 Units Subcutaneous QHS   insulin aspart  3 Units Subcutaneous TID WC   insulin detemir  5 Units Subcutaneous BID   isosorbide mononitrate  30 mg Oral Daily   nutrition supplement (JUVEN)  1 packet Oral BID BM   pantoprazole  40 mg Oral Daily   sacubitril-valsartan  1 tablet Oral BID   zinc sulfate  220 mg Oral Daily    Waverly Ferrari Office: [phone] 06/25/2022

## 2022-06-25 NOTE — Progress Notes (Signed)
Patient ID: Caleb [name], male   DOB: 01/11/1951, 72 y.o.   MRN: [id number] Patient is postoperative day 1 excision of necrotic heel ulcer and partial calcaneal excision.  There is 50 cc in the wound VAC canister he is in a PRAFO boot.  Cultures are showing gram-positive cocci.  Patient may discharge to home on antibiotic coverage once cultures are finalized.  I will follow-up in the office 1 week after discharge.  Patient will transition to the [facility] plus portable wound VAC pump for discharge.

## 2022-06-25 NOTE — Progress Notes (Signed)
Physical Therapy Re-Evaluation Patient Details Name: Caleb [name] MRN: [id number] DOB: January 21, 1951 Today's Date: 06/25/2022   History of Present Illness Pt is a 72 y.o. male admitted 3/19 with R calcaneal osteomyelitis. Pt underwent RLE angiogram 3/25. Tentative surgical plan for RLE bypass 3/29. Currently s/p excision of necrotic heel ulcer and partial calcaneal excision on 4/6. PMH: IDDM, GERD, HTN, hypercholesterolemia, hyponatremia, chronic systolic CHF with ejection fraction 35 to 40%, h/o TAVR, PVD, neuropathy.    PT Comments    Pt is presenting below baseline from previous initial evaluation. Currently he is Mod to Min A for standing from EOB with AD at an elevated height. He was supervision to max A for transfers using a lateral scoot. Discussed going home at a W/C level in order to improve independence. Pt does not have stairs to get into his house and has a cousin who can come stay with him at night. Due to pt current functional status, home set up, available assistance recommending skilled physical therapy services at a higher frequency and level of care at 5-6x/week on discharge from acute care hospital setting in order to improve mobility, decrease level of assist required by caregiver and work toward independence.    Recommendations for follow up therapy are one component of a multi-disciplinary discharge planning process, led by the attending physician.  Recommendations may be updated based on patient status, additional functional criteria and insurance authorization.  Follow Up Recommendations       Assistance Recommended at Discharge Intermittent Supervision/Assistance  Patient can return home with the following Assistance with cooking/housework;Assist for transportation;Help with stairs or ramp for entrance;Two people to help with walking and/or transfers   Equipment Recommendations  Wheelchair (measurements PT);Wheelchair cushion (measurements PT);BSC/3in1 (drop arm BSC, 18  inch wheel chair with cushion, flip back arm rests and removable elevating leg rests)    Recommendations for Other Services Rehab consult     Precautions / Restrictions Precautions Precautions: Fall Restrictions Weight Bearing Restrictions: Yes RLE Weight Bearing: Non weight bearing     Mobility  Bed Mobility Overal bed mobility: Needs Assistance Bed Mobility: Supine to Sit, Sit to Supine     Supine to sit: Supervision, HOB elevated Sit to supine: Min assist   General bed mobility comments: Pt used rails and was Min A for sitting to supine with the RLE, Min A to scoot to EOB Patient Response: Cooperative  Transfers Overall transfer level: Needs assistance Equipment used: Rolling walker (2 wheels), None Transfers: Sit to/from Stand, Bed to chair/wheelchair/BSC Sit to Stand: Mod assist          Lateral/Scoot Transfers: Supervision, Max assist General transfer comment: Pt was Mod A for initial power up from EOB for sit to stand, Min A for second attempt. Unable to clear LLE from the floor while maintaining WB precautions in standing. Performed lateral scoot from bed>recliner at supervision and Max A from recliner to bed with therapist using foot to ensure pt was not breaking WB precaution. Intermittent reminder to not WB through the RLE    Ambulation/Gait               General Gait Details: Pt was unable to progress gait at this time due to weakness in the bil UE and LLE   Stairs Stairs:  (unable to attempt due to pt currently is NWB with poor balance.)              Balance Overall balance assessment: Needs assistance Sitting-balance support: Feet supported, No  upper extremity supported Sitting balance-Leahy Scale: Good   Postural control: Posterior lean Standing balance support: Bilateral upper extremity supported, During functional activity, Reliant on assistive device for balance Standing balance-Leahy Scale: Poor Standing balance comment: Mod to Min  A for maintaining upright balance with posterior lean while adhering to WB precautions            Cognition Arousal/Alertness: Awake/alert Behavior During Therapy: WFL for tasks assessed/performed Overall Cognitive Status: Within Functional Limits for tasks assessed           General Comments General comments (skin integrity, edema, etc.): Pt HR up to 124 bpm during session      Pertinent Vitals/Pain Pain Assessment Pain Assessment: 0-10 Pain Score: 6  Pain Location: R heel Pain Descriptors / Indicators: Sharp, Shooting Pain Intervention(s): Limited activity within patient's tolerance, Monitored during session     PT Goals (current goals can now be found in the care plan section) Acute Rehab PT Goals Patient Stated Goal: home, independence PT Goal Formulation: With patient Time For Goal Achievement: 06/27/22 Potential to Achieve Goals: Good Progress towards PT goals: Progressing toward goals    Frequency    Min 4X/week      PT Plan Discharge plan needs to be updated       AM-PAC PT "6 Clicks" Mobility   Outcome Measure  Help needed turning from your back to your side while in a flat bed without using bedrails?: None Help needed moving from lying on your back to sitting on the side of a flat bed without using bedrails?: A Little Help needed moving to and from a bed to a chair (including a wheelchair)?: A Lot Help needed standing up from a chair using your arms (e.g., wheelchair or bedside chair)?: A Lot Help needed to walk in hospital room?: Total Help needed climbing 3-5 steps with a railing? : Total 6 Click Score: 13    End of Session Equipment Utilized During Treatment: Gait belt Activity Tolerance: Patient tolerated treatment well Patient left: in bed;with bed alarm set;with call bell/phone within reach Nurse Communication: Mobility status PT Visit Diagnosis: Pain;Other abnormalities of gait and mobility (R26.89);Muscle weakness (generalized)  (M62.81) Pain - Right/Left: Right Pain - part of body: Ankle and joints of foot     Time: 1107-1200 PT Time Calculation (min) (ACUTE ONLY): 53 min  Charges:  $Therapeutic Activity: 53-67 mins                    Harrel Carina, DPT, CLT  Acute Rehabilitation Services Office: [phone] (Secure chat preferred)    Claudia Desanctis 06/25/2022, 1:29 PM

## 2022-06-25 NOTE — Progress Notes (Signed)
ANTICOAGULATION CONSULT NOTE  Pharmacy Consult for heparin  Indication: critical limb ischemia   Allergies  Allergen Reactions   Codeine Nausea And Vomiting   Norco [Hydrocodone-Acetaminophen] Nausea And Vomiting   Percocet [Oxycodone-Acetaminophen] Nausea And Vomiting   Tramadol Hcl Nausea And Vomiting    Patient Measurements: Height: 5\' 7"  (170.2 cm) Weight: 85.9 kg (189 lb 6 oz) IBW/kg (Calculated) : 66.1 HEPARIN DW (KG): 83.8   Vital Signs: Temp: 97.6 F (36.4 C) (04/06 1644) Temp Source: Axillary (04/06 1644) BP: 110/73 (04/06 1644) Pulse Rate: 78 (04/06 1644)  Labs: Recent Labs    06/24/22 0046 06/24/22 1125 06/24/22 2014 06/25/22 0706 06/25/22 1214 06/25/22 1450  HGB 9.9*  --   --  10.5* 10.1*  --   HCT 29.5*  --   --  32.1* 30.1*  --   PLT 327  --   --  352 348  --   HEPARINUNFRC 0.26*   < > 0.24* 0.43  --  0.35  CREATININE 1.54*  --   --  1.65* 1.61*  --    < > = values in this interval not displayed.     Estimated Creatinine Clearance: 43.4 mL/min (A) (by C-G formula based on SCr of 1.61 mg/dL (H)).   Medical History: Past Medical History:  Diagnosis Date   Carotid artery occlusion    Diabetes mellitus without complication    Type II   Dyslipidemia 09/27/2012   Erectile dysfunction 09/27/2012   GERD (gastroesophageal reflux disease)    Hypercholesteremia    Hyperlipidemia 08/12/2012   Hypertension    Hyponatremia 08/14/2012   Neuropathy    Pancreatitis    Pancreatitis, acute 09/27/2012   Peripheral vascular disease    S/P 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 Excell Seltzer & Bartle   Severe aortic stenosis    Vitamin D deficiency 06/23/2015    Assessment: 20 you male with critical limb ischemia s/p R CFA endarterectomy with femoral to ATA bypass on 3/30. Pharmacy dosing heparin  Confirmatory heparin level 0.35 units/mL (therapeutic) on heparin 1800 units/hr.  Verified with RN heparin has been running without issues throughout the morning. No bleeding concerns.  Goal of Therapy:  Heparin level 0.3-0.7 units/ml Monitor platelets by anticoagulation protocol: Yes  Plan: Continue heparin infusion at 1800 units/hr  Monitor daily heparin level and CBC Continue to monitor H&H   Consider transition to DOAC if remains clinically stable   Eldridge Scot, PharmD Clinical Pharmacist 06/25/2022 5:02 PM

## 2022-06-25 NOTE — Progress Notes (Signed)
ANTICOAGULATION CONSULT NOTE  Pharmacy Consult for heparin  Indication: critical limb ischemia   Allergies  Allergen Reactions   Codeine Nausea And Vomiting   Norco [Hydrocodone-Acetaminophen] Nausea And Vomiting   Percocet [Oxycodone-Acetaminophen] Nausea And Vomiting   Tramadol Hcl Nausea And Vomiting    Patient Measurements: Height: 5\' 7"  (170.2 cm) Weight: 85.9 kg (189 lb 6 oz) IBW/kg (Calculated) : 66.1 HEPARIN DW (KG): 83.8   Vital Signs: Temp: 97.5 F (36.4 C) (04/06 0741) Temp Source: Axillary (04/06 0741) BP: 108/70 (04/06 0727) Pulse Rate: 94 (04/06 0325)  Labs: Recent Labs    06/23/22 0521 06/23/22 1756 06/24/22 0046 06/24/22 1125 06/24/22 2014 06/25/22 0706  HGB 9.4*  --  9.9*  --   --  10.5*  HCT 29.6*  --  29.5*  --   --  32.1*  PLT 308  --  327  --   --  352  HEPARINUNFRC 0.14*   < > 0.26* <0.10* 0.24* 0.43  CREATININE 1.51*  --  1.54*  --   --   --    < > = values in this interval not displayed.     Estimated Creatinine Clearance: 45.4 mL/min (A) (by C-G formula based on SCr of 1.54 mg/dL (H)).   Medical History: Past Medical History:  Diagnosis Date   Carotid artery occlusion    Diabetes mellitus without complication    Type II   Dyslipidemia 09/27/2012   Erectile dysfunction 09/27/2012   GERD (gastroesophageal reflux disease)    Hypercholesteremia    Hyperlipidemia 08/12/2012   Hypertension    Hyponatremia 08/14/2012   Neuropathy    Pancreatitis    Pancreatitis, acute 09/27/2012   Peripheral vascular disease    S/P 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 Excell [employer]   Severe aortic stenosis    Vitamin D deficiency 06/23/2015      Assessment: 32 you male with critical limb ischemia s/p R CFA endarterectomy with femoral to ATA bypass on 3/30. Pharmacy dosing heparin  Heparin level 0.43 on drip rate 1800 units/hr therapeutic. First level  within range. Hgb 10.5 and plt 352, stable. No s/sx bleeding noted.    Goal of Therapy:  Heparin level 0.3-0.7 units/ml Monitor platelets by anticoagulation protocol: Yes  Plan: Continue heparin infusion at 1800 units/hr  Obtain confirmation level this PM  Monitor daily heparin level and CBC Continue to monitor H&H   Consider transition to DOAC if remains clinically stable   Jerry Caras, PharmD PGY1 Pharmacy Resident   06/25/2022 9:44 AM

## 2022-06-25 NOTE — Progress Notes (Signed)
Mobility Specialist: Progress Note   06/25/22 1814  Mobility  Activity Ambulated with assistance to bathroom  Level of Assistance +2 (takes two people) Clinical biochemist)  Assistive Device Front wheel walker  Distance Ambulated (ft) 20 ft  RLE Weight Bearing NWB  Activity Response Tolerated fair  Mobility Referral Yes  $Mobility charge 1 Mobility   Pt received in the bed and requesting to use the BR. Mod I with bed mobility and minA to stand. +2 for line management/safety. Pt NWB but unable to maintain all the way to the BR. No c/o throughout. Pt assisted back to the bed using the stedy and able to maintain NWB status. Pt is back in the bed with RN and NT present at end of session.   Rewa Weissberg Mobility Specialist Please contact via SecureChat or Rehab office at [phone]

## 2022-06-26 DIAGNOSIS — M86171 Other acute osteomyelitis, right ankle and foot: Secondary | ICD-10-CM | POA: Diagnosis not present

## 2022-06-26 LAB — COMPREHENSIVE METABOLIC PANEL
ALT: 11 U/L (ref 0–44)
AST: 15 U/L (ref 15–41)
Albumin: 2 g/dL — ABNORMAL LOW (ref 3.5–5.0)
Alkaline Phosphatase: 90 U/L (ref 38–126)
Anion gap: 6 (ref 5–15)
BUN: 42 mg/dL — ABNORMAL HIGH (ref 8–23)
CO2: 25 mmol/L (ref 22–32)
Calcium: 7.9 mg/dL — ABNORMAL LOW (ref 8.9–10.3)
Chloride: 98 mmol/L (ref 98–111)
Creatinine, Ser: 1.48 mg/dL — ABNORMAL HIGH (ref 0.61–1.24)
GFR, Estimated: 50 mL/min — ABNORMAL LOW (ref >60)
Glucose, Bld: 184 mg/dL — ABNORMAL HIGH (ref 70–99)
Potassium: 4.3 mmol/L (ref 3.5–5.1)
Sodium: 129 mmol/L — ABNORMAL LOW (ref 135–145)
Total Bilirubin: 0.5 mg/dL (ref 0.3–1.2)
Total Protein: 6.2 g/dL — ABNORMAL LOW (ref 6.5–8.1)

## 2022-06-26 LAB — GLUCOSE, CAPILLARY
Glucose-Capillary: 118 mg/dL — ABNORMAL HIGH (ref 70–99)
Glucose-Capillary: 148 mg/dL — ABNORMAL HIGH (ref 70–99)
Glucose-Capillary: 118 mg/dL — ABNORMAL HIGH (ref 70–99)
Glucose-Capillary: 210 mg/dL — ABNORMAL HIGH (ref 70–99)

## 2022-06-26 LAB — CBC
HCT: 28.6 % — ABNORMAL LOW (ref 39.0–52.0)
Hemoglobin: 9.5 g/dL — ABNORMAL LOW (ref 13.0–17.0)
MCH: 27 pg (ref 26.0–34.0)
MCHC: 33.2 g/dL (ref 30.0–36.0)
MCV: 81.3 fL (ref 80.0–100.0)
Platelets: 295 10*3/uL (ref 150–400)
RBC: 3.52 MIL/uL — ABNORMAL LOW (ref 4.22–5.81)
RDW: 16.9 % — ABNORMAL HIGH (ref 11.5–15.5)
WBC: 10.9 10*3/uL — ABNORMAL HIGH (ref 4.0–10.5)
nRBC: 0 % (ref 0.0–0.2)

## 2022-06-26 LAB — PHOSPHORUS: Phosphorus: 3.4 mg/dL (ref 2.5–4.6)

## 2022-06-26 LAB — HEPARIN LEVEL (UNFRACTIONATED): Heparin Unfractionated: 0.29 IU/mL — ABNORMAL LOW (ref 0.30–0.70)

## 2022-06-26 LAB — MAGNESIUM: Magnesium: 1.7 mg/dL (ref 1.7–2.4)

## 2022-06-26 MED ORDER — GABAPENTIN 400 MG PO CAPS
800.0000 mg | ORAL_CAPSULE | Freq: Every day | ORAL | Status: DC
  Administered 2022-06-26 – 2022-06-29 (×4): 800 mg via ORAL
  Filled 2022-06-26: qty 8
  Filled 2022-06-26 (×3): qty 2

## 2022-06-26 MED ORDER — ACETAMINOPHEN 500 MG PO TABS
1000.0000 mg | ORAL_TABLET | Freq: Three times a day (TID) | ORAL | Status: AC
  Administered 2022-06-26 – 2022-06-29 (×9): 1000 mg via ORAL
  Filled 2022-06-26 (×9): qty 2

## 2022-06-26 MED ORDER — APIXABAN 5 MG PO TABS
5.0000 mg | ORAL_TABLET | Freq: Two times a day (BID) | ORAL | Status: DC
  Administered 2022-06-26 – 2022-06-30 (×9): 5 mg via ORAL
  Filled 2022-06-26 (×9): qty 1

## 2022-06-26 MED ORDER — GABAPENTIN 100 MG PO CAPS
100.0000 mg | ORAL_CAPSULE | Freq: Two times a day (BID) | ORAL | Status: DC
  Administered 2022-06-27: 100 mg via ORAL
  Filled 2022-06-26: qty 1

## 2022-06-26 MED ORDER — APIXABAN 5 MG PO TABS: 5.0000 mg | ORAL_TABLET | Freq: Two times a day (BID) | ORAL | Status: DC

## 2022-06-26 MED ORDER — ACETAMINOPHEN 325 MG PO TABS
650.0000 mg | ORAL_TABLET | Freq: Four times a day (QID) | ORAL | Status: DC | PRN
  Administered 2022-06-29: 650 mg via ORAL
  Filled 2022-06-26: qty 2

## 2022-06-26 NOTE — Progress Notes (Signed)
PROGRESS NOTE    Caleb [name]  PQZ:[id number] DOB: [birth year] DOA: 06/07/2022 PCP: Hoy Register, MD  Chief Complaint  Patient presents with   Wound Check   Foot Pain    Brief Narrative:   Caleb [name] is Caleb [name] 72 year old man with PVD, DM 2, HTN, AAS s/p TAVR, HFrEF with Gianina Olinde EF of 35% send CKD 3 was admitted with recurrent diabetic foot infection and acute on chronic kidney injury. Workup has revealed acute osteomyelitis in the posterolateral aspect of the calcaneus. Seen by ID and per their recommendations, he is on doxycycline/Augmentin pending surgery/bone biopsy per ID recommendations, for which he is potentially scheduled on 06/22/2022 with Dr. Lajoyce Corners. Patient is being seen by vascular surgery , Status post angiogram 3/25, with right femoral-tibial bypass graft. Did well postop.   Assessment & Plan:   Principal Problem:   Acute osteomyelitis of right calcaneus Active Problems:   Type 2 diabetes mellitus with hyperlipidemia   Acute kidney injury superimposed on chronic kidney disease   HTN (hypertension)   Hyperlipidemia   Hyponatremia   Dyslipidemia   PAD (peripheral artery disease)   S/P TAVR (transcatheter aortic valve replacement)   Decubitus ulcer of right heel   Diabetic foot ulcer   Gastroenteritis  Sepsis secondary to acute osteomyelitis right heel and PVD  -Sepsis POA  -Appreciate infectious disease -Antibiotics per ID, continue doxycycline, Augmentin -Aspirin, Plavix, lipitor  -Status post angiogram 3/25.  Status post femoral-tibial bypass.  Continue heparin, will need doac at discharge (will message Dr. Lajoyce Corners regarding timing of transition).   -s/p R partial calcaneus excision, excision of skin and soft tissue R heel  - pending surgical cultures (rare gram positive cocci -> multiple organisms)   AKI on CKD stage IIIb, resolved -Baseline Cr 1.5-2.5 -Appreciate nephrology -Thought to be secondary to hypovolemia while on Entresto.  Sherryll Burger was resumed on  06/13/2022.  He is getting intermittent IV Lasix.  Scheduled oral Lasix resumed on 06/20/2022 -> increase to 80 mg BID, more like his home regimen. -Nephrology signed off 3/22.  Follow-up with Dr. Marisue Humble later this month -Improving   HFrEF Exacerbation  Volume Overload Has LE edema, will adjust oral lasix  Strict I/O, daily weights Continue entresto Hydralazine, imdur  Will monitor, BP's soft today  Hyponatremia Mild, follow with diuresis  Hypertension BP soft today Follow on imdur, entresto, hydral, lasix  SOB - CXR without active disease, no complaints today - continue lasix as above  Hyperkalemia -Resolved   Diabetes mellitus type 2 with hyperglycemia Hold home 70/30 while inpatient.  Continue basal and mealtime with SSI.      DVT prophylaxis: heparin gtt Code Status: full Family Communication: none Disposition:   Status is: Inpatient Remains inpatient appropriate because: pending ortho procedure    Consultants:  Vascular ID ortho  Procedures:   4/5 RIGHT PARTIAL CALCANEUS EXCISION, excision of skin and soft tissue right heel.   3/25 Conscious sedation Ultrasound-guided access to the left common femoral artery CO2 aortogram and iliac arteriogram Selective catheterization of the right external iliac artery (second-order catheterization) with right lower extremity runoff Mynx closure of the left common femoral artery   Antimicrobials:  Anti-infectives (From admission, onward)    Start     Dose/Rate Route Frequency Ordered Stop   06/24/22 1300  ceFAZolin (ANCEF) IVPB 2g/100 mL premix        2 g 200 mL/hr over 30 Minutes Intravenous Every 8 hours 06/24/22 1016 06/24/22 1330   06/24/22 0600  ceFAZolin (ANCEF)  IVPB 2g/100 mL premix        2 g 200 mL/hr over 30 Minutes Intravenous On call to O.R. 06/23/22 1747 06/24/22 0841   06/21/22 1000  amoxicillin-clavulanate (AUGMENTIN) 875-125 MG per tablet 1 tablet        1 tablet Oral Every 12 hours 06/21/22 0657      06/17/22 0740  ceFAZolin (ANCEF) 2-4 GM/100ML-% IVPB       Note to Pharmacy: Launa Flightavy, Adrian M: cabinet override      06/17/22 0740 06/17/22 1944   06/16/22 1800  doxycycline (VIBRA-TABS) tablet 100 mg        100 mg Oral 2 times daily after meals 06/16/22 1600     06/09/22 1245  doxycycline (VIBRA-TABS) tablet 100 mg  Status:  Discontinued        100 mg Oral Every 12 hours 06/09/22 1145 06/16/22 1600   06/09/22 1245  amoxicillin-clavulanate (AUGMENTIN) 500-125 MG per tablet 1 tablet  Status:  Discontinued        1 tablet Oral 2 times daily 06/09/22 1145 06/21/22 0657   06/08/22 1600  cefTRIAXone (ROCEPHIN) 2 g in sodium chloride 0.9 % 100 mL IVPB  Status:  Discontinued        2 g 200 mL/hr over 30 Minutes Intravenous Every 24 hours 06/08/22 0852 06/09/22 1145   06/08/22 1200  DAPTOmycin (CUBICIN) 600 mg in sodium chloride 0.9 % IVPB  Status:  Discontinued        8 mg/kg  77.4 kg 124 mL/hr over 30 Minutes Intravenous Every 48 hours 06/08/22 1012 06/09/22 1145   06/07/22 1415  linezolid (ZYVOX) IVPB 600 mg  Status:  Discontinued        600 mg 300 mL/hr over 60 Minutes Intravenous Every 12 hours 06/07/22 1414 06/08/22 0852   06/07/22 1415  ceFEPIme (MAXIPIME) 2 g in sodium chloride 0.9 % 100 mL IVPB  Status:  Discontinued        2 g 200 mL/hr over 30 Minutes Intravenous Daily 06/07/22 1414 06/08/22 0852       Subjective: No complaints Pain better after meds earlier   Objective: Vitals:   06/26/22 0836 06/26/22 1214 06/26/22 1550 06/26/22 1611  BP: 103/70 90/69 121/77 109/66  Pulse:  74 89 80  Resp:  12 20 11   Temp: 98 F (36.7 C) 98 F (36.7 C) 98.3 F (36.8 C)   TempSrc: Oral Oral Oral   SpO2:  93% 93% 98%  Weight:      Height:        Intake/Output Summary (Last 24 hours) at 06/26/2022 1714 Last data filed at 06/26/2022 1213 Gross per 24 hour  Intake 480 ml  Output 1000 ml  Net -520 ml    Filed Weights   06/24/22 0747 06/25/22 0500 06/26/22 0633  Weight: 86.7 kg  85.9 kg 89.4 kg    Examination:   General: No acute distress. Cardiovascular: RRR Lungs: unlabored Neurological: Alert and oriented 3. Moves all extremities 4 with equal strength. Cranial nerves II through XII grossly intact. Extremities: RLE prafo boot, mild LE edema  Data Reviewed: I have personally reviewed following labs and imaging studies  CBC: Recent Labs  Lab 06/23/22 0521 06/24/22 0046 06/25/22 0706 06/25/22 1214 06/26/22 0118  WBC 7.5 7.6 12.8* 11.9* 10.9*  NEUTROABS  --   --  9.7*  --   --   HGB 9.4* 9.9* 10.5* 10.1* 9.5*  HCT 29.6* 29.5* 32.1* 30.1* 28.6*  MCV 82.9 80.6 81.5 80.7 81.3  PLT 308 327 352 348 295    Basic Metabolic Panel: Recent Labs  Lab 06/23/22 0521 06/24/22 0046 06/25/22 0706 06/25/22 1214 06/26/22 0118  NA 133* 132* 132* 131* 129*  K 5.1 4.7 5.0 4.2 4.3  CL 102 99 98 96* 98  CO2 27 24 23 25 25   GLUCOSE 88 129* 150* 152* 184*  BUN 33* 30* 32* 35* 42*  CREATININE 1.51* 1.54* 1.65* 1.61* 1.48*  CALCIUM 8.4* 8.4* 8.5* 8.5* 7.9*  MG  --   --  1.7  --  1.7  PHOS  --   --  3.8  --  3.4    GFR: Estimated Creatinine Clearance: 48.1 mL/min (Zeddie Njie) (by C-G formula based on SCr of 1.48 mg/dL (H)).  Liver Function Tests: Recent Labs  Lab 06/25/22 0706 06/26/22 0118  AST 16 15  ALT 11 11  ALKPHOS 91 90  BILITOT 0.6 0.5  PROT 6.7 6.2*  ALBUMIN 2.3* 2.0*    CBG: Recent Labs  Lab 06/25/22 1645 06/25/22 2104 06/26/22 0640 06/26/22 1039 06/26/22 1643  GLUCAP 99 183* 118* 148* 118*     Recent Results (from the past 240 hour(s))  Surgical pcr screen     Status: None   Collection Time: 06/16/22  9:23 PM   Specimen: Nasal Mucosa; Nasal Swab  Result Value Ref Range Status   MRSA, PCR NEGATIVE NEGATIVE Final   Staphylococcus aureus NEGATIVE NEGATIVE Final    Comment: (NOTE) The Xpert SA Assay (FDA approved for NASAL specimens in patients 74 years of age and older), is one component of Patrina Andreas comprehensive surveillance program. It  is not intended to diagnose infection nor to guide or monitor treatment. Performed at [facility], [address], [place], [place] [address]   Aerobic/Anaerobic Culture w Gram Stain (surgical/deep wound)     Status: Abnormal (Preliminary result)   Collection Time: 06/24/22  8:55 AM   Specimen: Soft Tissue, Other  Result Value Ref Range Status   Specimen Description TISSUE  Final   Special Requests  CALCANEUS  Final   Gram Stain   Final    NO WBC SEEN RARE GRAM POSITIVE COCCI Performed at [facility], [address], [place], [place] [address]    Culture MULTIPLE ORGANISMS PRESENT, NONE PREDOMINANT (Manuelita Moxon)  Final   Report Status PENDING  Incomplete         Radiology Studies: No results found.      Scheduled Meds:  acetaminophen  1,000 mg Oral Q8H   amoxicillin-clavulanate  1 tablet Oral Q12H   apixaban  5 mg Oral BID   vitamin C  1,000 mg Oral Daily   aspirin  81 mg Oral Daily   atorvastatin  40 mg Oral Daily   carvedilol  12.5 mg Oral BID WC   Chlorhexidine Gluconate Cloth  6 each Topical Daily   cholecalciferol  1,000 Units Oral Daily   cyanocobalamin  500 mcg Oral q AM   docusate sodium  100 mg Oral Daily   doxycycline  100 mg Oral BID PC   ferrous sulfate  325 mg Oral Q breakfast   furosemide  80 mg Oral BID   [START ON 06/27/2022] gabapentin  100 mg Oral BID WC   gabapentin  800 mg Oral QHS   hydrALAZINE  25 mg Oral TID   insulin aspart  0-15 Units Subcutaneous TID WC   insulin aspart  0-5 Units Subcutaneous QHS   insulin aspart  3 Units Subcutaneous TID WC  insulin detemir  5 Units Subcutaneous BID   isosorbide mononitrate  30 mg Oral Daily   nutrition supplement (JUVEN)  1 packet Oral BID BM   pantoprazole  40 mg Oral Daily   sacubitril-valsartan  1 tablet Oral BID   zinc sulfate  220 mg Oral Daily   Continuous Infusions:  sodium chloride     sodium chloride     magnesium sulfate bolus IVPB       LOS: 19 days    Time spent: over 30  min    Lacretia Nicks, MD [facility]   To contact the attending provider between 7A-7P or the covering provider during after hours 7P-7A, please log into the web site [email or web address] and access using universal [place] password for that web site. If you do not have the password, please call the hospital operator.  06/26/2022, 5:14 PM

## 2022-06-26 NOTE — Progress Notes (Addendum)
  Progress Note  VASCULAR SURGERY ASSESSMENT & PLAN:   POD 9 RIGHT SFA-ATA BYPASS (VEIN): His bypass graft is patent.    VASCULAR QUALITY ISSUES: He is on aspirin and a statin.   OSTEOMYELITIS RIGHT CALCANEUS: Dr. Lajoyce Corners did a right partial calcaneus excision Friday.   DVT PROPHYLAXIS: He is on subcu heparin.  Continue to mobilize.  Possibly ready for discharge tomorrow.  06/26/2022 7:57 AM 2 Days Post-Op  Subjective: Having pain in the right foot but this is controlled.  Very grateful for all the care everyone is giving him    Vitals:   06/25/22 2307 06/26/22 0633  BP: 98/76 102/72  Pulse: 89 83  Resp: 14 17  Temp: 97.8 F (36.6 C) 98 F (36.7 C)  SpO2: 100% 99%    Physical Exam: General: No acute distress Cardiac:  regular Lungs:  nonlabored Incisions:  RLE incisions dry and intact, no hematoma or drainage Extremities:  Brisk right AT signal. Right heel with wound vac with good seal. Right foot in PRAFO boot  CBC    Component Value Date/Time   WBC 10.9 (H) 06/26/2022 0118   RBC 3.52 (L) 06/26/2022 0118   HGB 9.5 (L) 06/26/2022 0118   HGB 14.6 03/09/2022 1233   HGB 13.3 11/15/2019 1139   HCT 28.6 (L) 06/26/2022 0118   HCT 40.2 11/15/2019 1139   PLT 295 06/26/2022 0118   PLT 216 03/09/2022 1233   PLT 300 11/15/2019 1139   MCV 81.3 06/26/2022 0118   MCV 86 11/15/2019 1139   MCH 27.0 06/26/2022 0118   MCHC 33.2 06/26/2022 0118   RDW 16.9 (H) 06/26/2022 0118   RDW 14.5 11/15/2019 1139   LYMPHSABS 1.7 06/25/2022 0706   LYMPHSABS 3.3 (H) 09/28/2017 1113   MONOABS 1.3 (H) 06/25/2022 0706   EOSABS 0.1 06/25/2022 0706   EOSABS 0.3 09/28/2017 1113   BASOSABS 0.0 06/25/2022 0706   BASOSABS 0.0 09/28/2017 1113    BMET    Component Value Date/Time   NA 129 (L) 06/26/2022 0118   NA 142 02/16/2022 1544   K 4.3 06/26/2022 0118   CL 98 06/26/2022 0118   CO2 25 06/26/2022 0118   GLUCOSE 184 (H) 06/26/2022 0118   BUN 42 (H) 06/26/2022 0118   BUN 22 02/16/2022  1544   CREATININE 1.48 (H) 06/26/2022 0118   CREATININE 1.36 (H) 03/09/2022 1233   CREATININE 0.86 06/06/2016 1455   CALCIUM 7.9 (L) 06/26/2022 0118   GFRNONAA 50 (L) 06/26/2022 0118   GFRNONAA 56 (L) 03/09/2022 1233   GFRNONAA 87 04/02/2013 1632   GFRAA >60 12/17/2019 0354   GFRAA >89 04/02/2013 1632    INR    Component Value Date/Time   INR 1.0 01/11/2022 0957     Intake/Output Summary (Last 24 hours) at 06/26/2022 0757 Last data filed at 06/25/2022 2345 Gross per 24 hour  Intake 240 ml  Output 1300 ml  Net -1060 ml      Assessment/Plan:  72 y.o. male is 9 days post op, s/p: right CFA endarterectomy with femoral to ATA bypass   -Right lower extremity warm well-perfused with brisk AT signal -Right groin and lower extremity incisions dry and intact, without bleeding or drainage -Right foot in PRAFO boot.  Right heel with wound VAC with good seal.  Wound care per Dr. Lajoyce Corners -Continue heparin, will need DOAC at discharge.  Continue aspirin and statin   Loel Dubonnet, PA-C [facility] [phone] 06/26/2022 7:57 AM

## 2022-06-26 NOTE — Progress Notes (Signed)
ANTICOAGULATION CONSULT NOTE  Pharmacy Consult for apixaban Indication: critical limb ischemia   Allergies  Allergen Reactions   Codeine Nausea And Vomiting   Norco [Hydrocodone-Acetaminophen] Nausea And Vomiting   Percocet [Oxycodone-Acetaminophen] Nausea And Vomiting   Tramadol Hcl Nausea And Vomiting    Patient Measurements: Height: 5\' 7"  (170.2 cm) Weight: 89.4 kg (197 lb 1.5 oz) IBW/kg (Calculated) : 66.1 HEPARIN DW (KG): 83.8   Vital Signs: Temp: 98 F (36.7 C) (04/07 0633) Temp Source: Oral (04/07 5320) BP: 102/72 (04/07 2334) Pulse Rate: 83 (04/07 0633)  Labs: Recent Labs    06/25/22 0706 06/25/22 1214 06/25/22 1450 06/26/22 0118  HGB 10.5* 10.1*  --  9.5*  HCT 32.1* 30.1*  --  28.6*  PLT 352 348  --  295  HEPARINUNFRC 0.43  --  0.35 0.29*  CREATININE 1.65* 1.61*  --  1.48*    Estimated Creatinine Clearance: 48.1 mL/min (A) (by C-G formula based on SCr of 1.48 mg/dL (H)).   Medical History: Past Medical History:  Diagnosis Date   Carotid artery occlusion    Diabetes mellitus without complication    Type II   Dyslipidemia 09/27/2012   Erectile dysfunction 09/27/2012   GERD (gastroesophageal reflux disease)    Hypercholesteremia    Hyperlipidemia 08/12/2012   Hypertension    Hyponatremia 08/14/2012   Neuropathy    Pancreatitis    Pancreatitis, acute 09/27/2012   Peripheral vascular disease    S/P 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 Excell Seltzer & Bartle   Severe aortic stenosis    Vitamin D deficiency 06/23/2015      Assessment: 49 you male with critical limb ischemia s/p R CFA endarterectomy with femoral to ATA bypass on 3/30. Patient has been on heparin. Pharmacy consulted to dose apixaban.   Ok to transition to apixaban. Apixaban can be given immediately after the discontinuation of the heparin drip. Hgb 9.5 and plt 295, stable. No s/sx bleeding noted.    Goal of Therapy:  Heparin level 0.3-0.7 units/ml Monitor platelets by anticoagulation protocol: Yes  Plan: Stop heparin infusion  Start apixaban 5 mg BID  Monitor CBC for s/sx bleeding  F/u patient education    Jerry Caras, PharmD PGY1 Pharmacy Resident   06/26/2022 7:28 AM

## 2022-06-26 NOTE — Anesthesia Postprocedure Evaluation (Signed)
Anesthesia Post Note  Patient: Caleb [name]  Procedure(s) Performed: RIGHT PARTIAL CALCANEUS EXCISION (Right) APPLICATION OF WOUND VAC     Patient location during evaluation: PACU Anesthesia Type: General Level of consciousness: awake and alert Pain management: pain level controlled Vital Signs Assessment: post-procedure vital signs reviewed and stable Respiratory status: spontaneous breathing, nonlabored ventilation, respiratory function stable and patient connected to nasal cannula oxygen Cardiovascular status: blood pressure returned to baseline and stable Postop Assessment: no apparent nausea or vomiting Anesthetic complications: no  No notable events documented.  Last Vitals:  Vitals:   06/26/22 0633 06/26/22 0836  BP: 102/72 103/70  Pulse: 83   Resp: 17   Temp: 36.7 C 36.7 C  SpO2: 99%     Last Pain:  Vitals:   06/26/22 0836  TempSrc: Oral  PainSc:                  Savhanna Sliva

## 2022-06-26 NOTE — Progress Notes (Signed)
      INFECTIOUS DISEASE  ADDENDUM:   Date: 06/26/2022  Patient name: Caleb [name]  Medical record number: [id number]  Date of birth: 1951/01/06   Patient is growing Group B strep from operative cultures  I will dc doxycycline and continue augmentin  We will plan on 4 weeks of postoperative antibiotics with followup in ID    Caleb [name] has an appointment on 07/12/2022 at [facility] with Dr. Elinor Parkinson at  [facility], which  is located in the [facility] at  [address] in [place].  [address], which is located to the left of the elevators.  Phone: [phone]  Fax: [phone]  [email or web address]  The patient should arrive 30 minutes prior to their appoitment.  I expect patient could still grow an anerobe which would be covered by augmentin. I will followup his culture data in another few days  Otherwise I will sign off.  Please call with further questions.   Acey Lav 06/26/2022, 5:22 PM

## 2022-06-26 NOTE — Progress Notes (Signed)
Mobility Specialist Progress Note:   06/26/22 1130  Mobility  Activity  (bed level exercises)  Assistive Device None  Distance Ambulated (ft)  (unable/pt refused d/t pain)  Range of Motion/Exercises Active;All extremities  RLE Weight Bearing NWB  Activity Response Tolerated poorly  Mobility Referral Yes  $Mobility charge 1 Mobility   Pt presenting with significant RLE pain from "shin to toes". Politely declined any OOB mobility. Performed muiltiple bed level exercises with incr pain. Pt left with all needs met.   Will f/u tomorrow for hopeful OOB mobility.  Addison Lank Mobility Specialist Please contact via SecureChat or  Rehab office at [phone]

## 2022-06-27 ENCOUNTER — Encounter (HOSPITAL_COMMUNITY): Payer: Self-pay | Admitting: Orthopedic Surgery

## 2022-06-27 DIAGNOSIS — M86171 Other acute osteomyelitis, right ankle and foot: Secondary | ICD-10-CM | POA: Diagnosis not present

## 2022-06-27 LAB — BASIC METABOLIC PANEL
Anion gap: 5 (ref 5–15)
BUN: 43 mg/dL — ABNORMAL HIGH (ref 8–23)
CO2: 28 mmol/L (ref 22–32)
Calcium: 8 mg/dL — ABNORMAL LOW (ref 8.9–10.3)
Chloride: 98 mmol/L (ref 98–111)
Creatinine, Ser: 1.51 mg/dL — ABNORMAL HIGH (ref 0.61–1.24)
GFR, Estimated: 49 mL/min — ABNORMAL LOW (ref >60)
Glucose, Bld: 132 mg/dL — ABNORMAL HIGH (ref 70–99)
Potassium: 4.5 mmol/L (ref 3.5–5.1)
Sodium: 131 mmol/L — ABNORMAL LOW (ref 135–145)

## 2022-06-27 LAB — CBC
HCT: 28.2 % — ABNORMAL LOW (ref 39.0–52.0)
Hemoglobin: 8.9 g/dL — ABNORMAL LOW (ref 13.0–17.0)
MCH: 26.3 pg (ref 26.0–34.0)
MCHC: 31.6 g/dL (ref 30.0–36.0)
MCV: 83.2 fL (ref 80.0–100.0)
Platelets: 312 10*3/uL (ref 150–400)
RBC: 3.39 MIL/uL — ABNORMAL LOW (ref 4.22–5.81)
RDW: 16.7 % — ABNORMAL HIGH (ref 11.5–15.5)
WBC: 8.7 10*3/uL (ref 4.0–10.5)
nRBC: 0 % (ref 0.0–0.2)

## 2022-06-27 LAB — GLUCOSE, CAPILLARY
Glucose-Capillary: 89 mg/dL (ref 70–99)
Glucose-Capillary: 116 mg/dL — ABNORMAL HIGH (ref 70–99)
Glucose-Capillary: 110 mg/dL — ABNORMAL HIGH (ref 70–99)
Glucose-Capillary: 101 mg/dL — ABNORMAL HIGH (ref 70–99)
Glucose-Capillary: 127 mg/dL — ABNORMAL HIGH (ref 70–99)

## 2022-06-27 LAB — MAGNESIUM: Magnesium: 1.8 mg/dL (ref 1.7–2.4)

## 2022-06-27 LAB — PHOSPHORUS: Phosphorus: 3.6 mg/dL (ref 2.5–4.6)

## 2022-06-27 MED ORDER — GABAPENTIN 300 MG PO CAPS
300.0000 mg | ORAL_CAPSULE | Freq: Two times a day (BID) | ORAL | Status: DC
  Administered 2022-06-27 – 2022-06-30 (×7): 300 mg via ORAL
  Filled 2022-06-27 (×7): qty 1

## 2022-06-27 NOTE — Progress Notes (Addendum)
[facility] [place]  Subjective  - Very tearful   Objective 91/73 68 98.1 F (36.7 C) (Oral) 15 97%  Intake/Output Summary (Last 24 hours) at 06/27/2022 0724 Last data filed at 06/27/2022 0340 Gross per 24 hour  Intake 720 ml  Output 1300 ml  Net -580 ml   Right groin soft without hematoma Right LE incision healing well, vac to good suction on the heel( followed by Lajoyce Corners) Right AT signal intact, heel protected Lungs non labored breathing  Assessment/Planning: 72 y.o. male is 9 days post op, s/p: right CFA endarterectomy with femoral to ATA bypass   Tearful and feels usless Patent bypass  right AT signal intact, vac with good seal, Right foot in PRAFO boot.   per Lajoyce Corners Continue heparin, will need DOAC at discharge.  Continue aspirin and statin     Caleb [name] 06/27/2022 7:24 AM --  VASCULAR STAFF ADDENDUM: I have independently interviewed and examined the patient. I agree with the above.  In a better place mentally on my visit. Worried about the future and going home Bypass with excellent signal.  Wounds healing. Keeping an eye on the thigh saphenectomy.  Can transition to DOAC when okay with Cathi Roan, MD [facility] Phone Number: [phone] 06/27/2022 10:20 AM    Laboratory Lab Results: Recent Labs    06/26/22 0118 06/27/22 0104  WBC 10.9* 8.7  HGB 9.5* 8.9*  HCT 28.6* 28.2*  PLT 295 312   BMET Recent Labs    06/26/22 0118 06/27/22 0104  NA 129* 131*  K 4.3 4.5  CL 98 98  CO2 25 28  GLUCOSE 184* 132*  BUN 42* 43*  CREATININE 1.48* 1.51*  CALCIUM 7.9* 8.0*    COAG Lab Results  Component Value Date   INR 1.0 01/11/2022   INR 0.9 01/30/2020   INR 1.0 12/11/2019   No results found for: "PTT"

## 2022-06-27 NOTE — Progress Notes (Signed)
Occupational Therapy Treatment Patient Details Name: Caleb [name] MRN: [id number] DOB: [birth year] Today's Date: 06/27/2022   History of present illness Pt is a 72 y.o. male admitted 3/19 with R calcaneal osteomyelitis. Pt underwent RLE angiogram 3/25. Tentative surgical plan for RLE bypass 3/29. Currently s/p excision of necrotic heel ulcer and partial calcaneal excision on 4/6. PMH: IDDM, GERD, HTN, hypercholesterolemia, hyponatremia, chronic systolic CHF with ejection fraction 35 to 40%, h/o TAVR, PVD, neuropathy.   OT comments  Pt continuing to progress towards patient focused goals. Pt presented with new WB status that affects ability to safely complete bADLs and functional ambulation. Pt demos good sitting balance to perform dressing and hygiene but balance is impaired d/t NWB RLE and Pt is unable to perform hops with use of RW to ambulate. Pt educated on and perform pivot t/f and lateral scoot t/f with therapy needing Mod to Max A. Due to patient's current functional status and lack of home support they would be more appropriate for skilled rehab facility with >3hrs of therapy and 24/7 support and assist.    Recommendations for follow up therapy are one component of a multi-disciplinary discharge planning process, led by the attending physician.  Recommendations may be updated based on patient status, additional functional criteria and insurance authorization.    Assistance Recommended at Discharge Frequent or constant Supervision/Assistance  Patient can return home with the following  A little help with bathing/dressing/bathroom;Assistance with cooking/housework;Assist for transportation;Help with stairs or ramp for entrance;A lot of help with walking and/or transfers   Equipment Recommendations  Other (comment) (Defer to next level of care)    Recommendations for Other Services      Precautions / Restrictions Precautions Precautions: Fall Precaution Comments: R heel pain, wound  vac Restrictions Weight Bearing Restrictions: Yes RLE Weight Bearing: Non weight bearing Other Position/Activity Restrictions: Prafo on at all times       Mobility Bed Mobility Overal bed mobility: Needs Assistance Bed Mobility: Supine to Sit, Sit to Supine     Supine to sit: Supervision Sit to supine: Supervision   General bed mobility comments: Pt uses bed rails to assist in bed mobility    Transfers Overall transfer level: Needs assistance Equipment used: Rolling walker (2 wheels) Transfers: Sit to/from Stand Sit to Stand: Mod assist, From elevated surface   Squat pivot transfers: Max assist      Lateral/Scoot Transfers: Min guard General transfer comment: Pt able to complete lateral scoot t/f from bedside chair with increased time, 1 LOB onto bed.     Balance Overall balance assessment: Needs assistance Sitting-balance support: Feet supported Sitting balance-Leahy Scale: Good     Standing balance support: During functional activity Standing balance-Leahy Scale: Fair                             ADL either performed or assessed with clinical judgement   ADL Overall ADL's : Needs assistance/impaired                 Upper Body Dressing : Sitting;Supervision/safety;Set up Upper Body Dressing Details (indicate cue type and reason): Doffed/donned gown                   General ADL Comments: Pt with new WB status and limited in OOB mobility at this time    Extremity/Trunk Assessment              Vision  Perception     Praxis      Cognition Arousal/Alertness: Awake/alert Behavior During Therapy: WFL for tasks assessed/performed Overall Cognitive Status: Within Functional Limits for tasks assessed                                          Exercises      Shoulder Instructions       General Comments VSS on RA    Pertinent Vitals/ Pain       Pain Assessment Pain Assessment: No/denies  pain  Home Living                                          Prior Functioning/Environment              Frequency  Min 2X/week        Progress Toward Goals  OT Goals(current goals can now be found in the care plan section)  Progress towards OT goals: Progressing toward goals  Acute Rehab OT Goals Patient Stated Goal: To get all of this under control OT Goal Formulation: With patient Time For Goal Achievement: 07/11/22 Potential to Achieve Goals: Good ADL Goals Pt Will Perform Lower Body Bathing: with supervision;with set-up;sitting/lateral leans Pt Will Perform Lower Body Dressing: with min assist Pt Will Transfer to Toilet: squat pivot transfer;with min assist  Plan      Co-evaluation                 AM-PAC OT "6 Clicks" Daily Activity     Outcome Measure   Help from another person eating meals?: None Help from another person taking care of personal grooming?: None Help from another person toileting, which includes using toliet, bedpan, or urinal?: A Lot Help from another person bathing (including washing, rinsing, drying)?: A Lot Help from another person to put on and taking off regular upper body clothing?: A Little Help from another person to put on and taking off regular lower body clothing?: A Lot 6 Click Score: 17    End of Session Equipment Utilized During Treatment: Gait belt;Rolling walker (2 wheels)  OT Visit Diagnosis: Unsteadiness on feet (R26.81);Pain Pain - Right/Left: Right Pain - part of body: Ankle and joints of foot   Activity Tolerance Patient tolerated treatment well   Patient Left in bed;with call bell/phone within reach   Nurse Communication Mobility status        Time: 8756-4332 OT Time Calculation (min): 31 min  Charges: OT General Charges $OT Visit: 1 Visit OT Treatments $Therapeutic Activity: 23-37 mins  06/27/2022  AB, OTR/L  Acute Rehabilitation Services  Office: [phone]   [name] 06/27/2022, 3:11 PM

## 2022-06-27 NOTE — Discharge Instructions (Signed)
Information on my medicine - ELIQUIS (apixaban)  This medication education was reviewed with me or my healthcare representative as part of my discharge preparation.    Why was Eliquis prescribed for you? Eliquis was prescribed for you to reduce the risk of forming blood clots with peripheral vascular disease (circulation issues in blood vessels of the legs).  What do You need to know about Eliquis ? Take your Eliquis TWICE DAILY - one tablet in the morning and one tablet in the evening with or without food.  It would be best to take the doses about the same time each day.  If you have difficulty swallowing the tablet whole please discuss with your pharmacist how to take the medication safely.  Take Eliquis exactly as prescribed by your doctor and DO NOT stop taking Eliquis without talking to the doctor who prescribed the medication.  Stopping may increase your risk of developing a new clot or stroke.  Refill your prescription before you run out.  After discharge, you should have regular check-up appointments with your healthcare provider that is prescribing your Eliquis.  In the future your dose may need to be changed if your kidney function or weight changes by a significant amount or as you get older.  What do you do if you miss a dose? If you miss a dose, take it as soon as you remember on the same day and resume taking twice daily.  Do not take more than one dose of ELIQUIS at the same time.  Important Safety Information A possible side effect of Eliquis is bleeding. You should call your healthcare provider right away if you experience any of the following: Bleeding from an injury or your nose that does not stop. Unusual colored urine (red or dark brown) or unusual colored stools (red or black). Unusual bruising for unknown reasons. A serious fall or if you hit your head (even if there is no bleeding).  Some medicines may interact with Eliquis and might increase your risk of  bleeding or clotting while on Eliquis. To help avoid this, consult your healthcare provider or pharmacist prior to using any new prescription or non-prescription medications, including herbals, vitamins, non-steroidal anti-inflammatory drugs (NSAIDs) and supplements.  This website has more information on Eliquis (apixaban): [email or web address].[email or web address].

## 2022-06-27 NOTE — Progress Notes (Signed)
PROGRESS NOTE    Caleb [name]  ZYY:[id number] DOB: 25-Aug-1950 DOA: 06/07/2022 PCP: Hoy Register, MD  Chief Complaint  Patient presents with   Wound Check   Foot Pain    Brief Narrative:   Caleb [name] is Caleb [name] 72 year old man with PVD, DM 2, HTN, AAS s/p TAVR, HFrEF with Tashanti Dalporto EF of 35% send CKD 3 was admitted with recurrent diabetic foot infection and acute on chronic kidney injury. Workup has revealed acute osteomyelitis in the posterolateral aspect of the calcaneus. Seen by ID and per their recommendations, he is on doxycycline/Augmentin pending surgery/bone biopsy per ID recommendations, for which he is potentially scheduled on 06/22/2022 with Dr. Lajoyce Corners. Patient is being seen by vascular surgery , Status post angiogram 3/25, with right femoral-tibial bypass graft. Did well postop.   Assessment & Plan:   Principal Problem:   Acute osteomyelitis of right calcaneus Active Problems:   Type 2 diabetes mellitus with hyperlipidemia   Acute kidney injury superimposed on chronic kidney disease   HTN (hypertension)   Hyperlipidemia   Hyponatremia   Dyslipidemia   PAD (peripheral artery disease)   S/P TAVR (transcatheter aortic valve replacement)   Decubitus ulcer of right heel   Diabetic foot ulcer   Gastroenteritis  Sepsis secondary to acute osteomyelitis right heel and PVD  -Sepsis POA  -Appreciate infectious disease -Antibiotics per ID, continue Augmentin.  Plan for 4 wks post op abx and ID follow up. -Aspirin, Plavix, lipitor  -Status post angiogram 3/25.  Status post femoral-tibial bypass.  Continue heparin, will need doac at discharge (will message Dr. Lajoyce Corners regarding timing of transition).   -s/p R partial calcaneus excision, excision of skin and soft tissue R heel  - pending surgical cultures (rare gram positive cocci -> GBS among mixed organisms)    AKI on CKD stage IIIb, resolved -Baseline Cr 1.5-2.5 -Appreciate nephrology -Thought to be secondary to hypovolemia while on  Entresto.  Sherryll Burger was resumed on 06/13/2022.  He is getting intermittent IV Lasix.  Scheduled oral Lasix resumed on 06/20/2022 -> increase to 80 mg BID, more like his home regimen (80/40). -Nephrology signed off 3/22.  Follow-up with Dr. Marisue Humble later this month -Improving   HFrEF Exacerbation  Volume Overload Has LE edema, will adjust oral lasix  Strict I/O, daily weights Continue entresto Hydralazine, imdur  Will monitor, BP's soft today  Hyponatremia Mild, follow with diuresis  Hypertension Follow on imdur, entresto, hydral, lasix  SOB - CXR without active disease, no complaints today - continue lasix as above  Hyperkalemia -Resolved   Diabetes mellitus type 2 with hyperglycemia Hold home 70/30 while inpatient.  Continue basal and mealtime with SSI.      DVT prophylaxis: heparin gtt Code Status: full Family Communication: none Disposition:   Status is: Inpatient Remains inpatient appropriate because: pending ortho procedure    Consultants:  Vascular ID ortho  Procedures:   4/5 RIGHT PARTIAL CALCANEUS EXCISION, excision of skin and soft tissue right heel.   3/25 Conscious sedation Ultrasound-guided access to the left common femoral artery CO2 aortogram and iliac arteriogram Selective catheterization of the right external iliac artery (second-order catheterization) with right lower extremity runoff Mynx closure of the left common femoral artery   Antimicrobials:  Anti-infectives (From admission, onward)    Start     Dose/Rate Route Frequency Ordered Stop   06/24/22 1300  ceFAZolin (ANCEF) IVPB 2g/100 mL premix        2 g 200 mL/hr over 30 Minutes Intravenous Every 8  hours 06/24/22 1016 06/24/22 1330   06/24/22 0600  ceFAZolin (ANCEF) IVPB 2g/100 mL premix        2 g 200 mL/hr over 30 Minutes Intravenous On call to O.R. 06/23/22 1747 06/24/22 0841   06/21/22 1000  amoxicillin-clavulanate (AUGMENTIN) 875-125 MG per tablet 1 tablet        1 tablet Oral  Every 12 hours 06/21/22 0657     06/17/22 0740  ceFAZolin (ANCEF) 2-4 GM/100ML-% IVPB       Note to Pharmacy: Launa Flight M: cabinet override      06/17/22 0740 06/17/22 1944   06/16/22 1800  doxycycline (VIBRA-TABS) tablet 100 mg  Status:  Discontinued        100 mg Oral 2 times daily after meals 06/16/22 1600 06/26/22 1724   06/09/22 1245  doxycycline (VIBRA-TABS) tablet 100 mg  Status:  Discontinued        100 mg Oral Every 12 hours 06/09/22 1145 06/16/22 1600   06/09/22 1245  amoxicillin-clavulanate (AUGMENTIN) 500-125 MG per tablet 1 tablet  Status:  Discontinued        1 tablet Oral 2 times daily 06/09/22 1145 06/21/22 0657   06/08/22 1600  cefTRIAXone (ROCEPHIN) 2 g in sodium chloride 0.9 % 100 mL IVPB  Status:  Discontinued        2 g 200 mL/hr over 30 Minutes Intravenous Every 24 hours 06/08/22 0852 06/09/22 1145   06/08/22 1200  DAPTOmycin (CUBICIN) 600 mg in sodium chloride 0.9 % IVPB  Status:  Discontinued        8 mg/kg  77.4 kg 124 mL/hr over 30 Minutes Intravenous Every 48 hours 06/08/22 1012 06/09/22 1145   06/07/22 1415  linezolid (ZYVOX) IVPB 600 mg  Status:  Discontinued        600 mg 300 mL/hr over 60 Minutes Intravenous Every 12 hours 06/07/22 1414 06/08/22 0852   06/07/22 1415  ceFEPIme (MAXIPIME) 2 g in sodium chloride 0.9 % 100 mL IVPB  Status:  Discontinued        2 g 200 mL/hr over 30 Minutes Intravenous Daily 06/07/22 1414 06/08/22 0852       Subjective: C/o pain  Objective: Vitals:   06/26/22 2314 06/27/22 0329 06/27/22 0500 06/27/22 0746  BP: 99/65 91/73  109/77  Pulse: 76 66 68 81  Resp: 20 14 15 18   Temp: 98 F (36.7 C) 98.1 F (36.7 C)  99.2 F (37.3 C)  TempSrc: Oral Oral  Oral  SpO2: 97% 95% 97% 98%  Weight:   84.4 kg   Height:        Intake/Output Summary (Last 24 hours) at 06/27/2022 1451 Last data filed at 06/27/2022 0900 Gross per 24 hour  Intake 240 ml  Output 1400 ml  Net -1160 ml    Filed Weights   06/25/22 0500 06/26/22  0633 06/27/22 0500  Weight: 85.9 kg 89.4 kg 84.4 kg    Examination:  General: No acute distress. Cardiovascular: RRR Lungs: unlabored Abdomen: Soft, nontender, nondistended Neurological: Alert and oriented 3. Moves all extremities 4 with equal strength. Cranial nerves II through XII grossly intact. Extremities:R prafo boot    Data Reviewed: I have personally reviewed following labs and imaging studies  CBC: Recent Labs  Lab 06/24/22 0046 06/25/22 0706 06/25/22 1214 06/26/22 0118 06/27/22 0104  WBC 7.6 12.8* 11.9* 10.9* 8.7  NEUTROABS  --  9.7*  --   --   --   HGB 9.9* 10.5* 10.1* 9.5* 8.9*  HCT  29.5* 32.1* 30.1* 28.6* 28.2*  MCV 80.6 81.5 80.7 81.3 83.2  PLT 327 352 348 295 312    Basic Metabolic Panel: Recent Labs  Lab 06/24/22 0046 06/25/22 0706 06/25/22 1214 06/26/22 0118 06/27/22 0104  NA 132* 132* 131* 129* 131*  K 4.7 5.0 4.2 4.3 4.5  CL 99 98 96* 98 98  CO2 24 23 25 25 28   GLUCOSE 129* 150* 152* 184* 132*  BUN 30* 32* 35* 42* 43*  CREATININE 1.54* 1.65* 1.61* 1.48* 1.51*  CALCIUM 8.4* 8.5* 8.5* 7.9* 8.0*  MG  --  1.7  --  1.7 1.8  PHOS  --  3.8  --  3.4 3.6    GFR: Estimated Creatinine Clearance: 45.9 mL/min (Sarely Stracener) (by C-G formula based on SCr of 1.51 mg/dL (H)).  Liver Function Tests: Recent Labs  Lab 06/25/22 0706 06/26/22 0118  AST 16 15  ALT 11 11  ALKPHOS 91 90  BILITOT 0.6 0.5  PROT 6.7 6.2*  ALBUMIN 2.3* 2.0*    CBG: Recent Labs  Lab 06/26/22 1643 06/26/22 2110 06/27/22 0535 06/27/22 1007 06/27/22 1208  GLUCAP 118* 210* 89 116* 110*     Recent Results (from the past 240 hour(s))  Aerobic/Anaerobic Culture w Gram Stain (surgical/deep wound)     Status: Abnormal (Preliminary result)   Collection Time: 06/24/22  8:55 AM   Specimen: Soft Tissue, Other  Result Value Ref Range Status   Specimen Description TISSUE  Final   Special Requests  CALCANEUS  Final   Gram Stain NO WBC SEEN RARE GRAM POSITIVE COCCI   Final    Culture (Lealon Vanputten)  Final    GROUP B STREP(S.AGALACTIAE)ISOLATED TESTING AGAINST S. AGALACTIAE NOT ROUTINELY PERFORMED DUE TO PREDICTABILITY OF AMP/PEN/VAN SUSCEPTIBILITY. AMONG MIXED ORGANISMS CULTURE REINCUBATED FOR BETTER GROWTH HOLDING FOR POSSIBLE ANAEROBE Performed [facility], [address] [place], [place] [address]    Report Status PENDING  Incomplete         Radiology Studies: No results found.      Scheduled Meds:  acetaminophen  1,000 mg Oral Q8H   amoxicillin-clavulanate  1 tablet Oral Q12H   apixaban  5 mg Oral BID   vitamin C  1,000 mg Oral Daily   aspirin  81 mg Oral Daily   atorvastatin  40 mg Oral Daily   carvedilol  12.5 mg Oral BID WC   Chlorhexidine Gluconate Cloth  6 each Topical Daily   cholecalciferol  1,000 Units Oral Daily   cyanocobalamin  500 mcg Oral q AM   docusate sodium  100 mg Oral Daily   ferrous sulfate  325 mg Oral Q breakfast   furosemide  80 mg Oral BID   gabapentin  300 mg Oral BID WC   gabapentin  800 mg Oral QHS   hydrALAZINE  25 mg Oral TID   insulin aspart  0-15 Units Subcutaneous TID WC   insulin aspart  0-5 Units Subcutaneous QHS   insulin aspart  3 Units Subcutaneous TID WC   insulin detemir  5 Units Subcutaneous BID   isosorbide mononitrate  30 mg Oral Daily   nutrition supplement (JUVEN)  1 packet Oral BID BM   pantoprazole  40 mg Oral Daily   sacubitril-valsartan  1 tablet Oral BID   zinc sulfate  220 mg Oral Daily   Continuous Infusions:  sodium chloride     sodium chloride     magnesium sulfate bolus IVPB       LOS:  20 days    Time spent: over 30 min    Lacretia Nicks, MD [facility]   To contact the attending provider between 7A-7P or the covering provider during after hours 7P-7A, please log into the web site [email or web address] and access using universal [place] password for that web site. If you do not have the password, please call the hospital operator.  06/27/2022, 2:51 PM

## 2022-06-27 NOTE — Progress Notes (Addendum)
Physical Therapy Treatment Patient Details Name: Caleb [name] MRN: [id number] DOB: 01/01/1951 Today's Date: 06/27/2022   History of Present Illness Pt is a 72 y.o. male admitted 3/19 with R calcaneal osteomyelitis. Pt underwent RLE angiogram 3/25. Tentative surgical plan for RLE bypass 3/29. Currently s/p excision of necrotic heel ulcer and partial calcaneal excision on 4/6. PMH: IDDM, GERD, HTN, hypercholesterolemia, hyponatremia, chronic systolic CHF with ejection fraction 35 to 40%, h/o TAVR, PVD, neuropathy.    PT Comments    Patient received in bed, states he was having a lot of pain earlier, but has since received pain medicine and is feeling much better. He is motivated and pleasant throughout session. Patient is mod I for bed mobility with use of bed rails. Requires mod to min A for sit to stand from elevated bed. Cues needed for hand placement and for R NWB during transfer. Patient is able to perform sit to stand transfers x 5 reps. He fatigues quickly standing for about a minute before needing to sit back down. He is able to maintain R NWB in standing with cues to hold foot up. Patient was able to scoot up toward head of bed in standing by pivoting on L LE with min A. Patient is unable to hop while maintaining full NWB on R LE. He will continue to benefit from skilled PT to improve independence and safety.        Recommendations for follow up therapy are one component of a multi-disciplinary discharge planning process, led by the attending physician.  Recommendations may be updated based on patient status, additional functional criteria and insurance authorization.  Follow Up Recommendations  Can patient physically be transported by private vehicle: No    Assistance Recommended at Discharge Intermittent Supervision/Assistance  Patient can return home with the following Assistance with cooking/housework;Assist for transportation;Help with stairs or ramp for entrance;Two people to help  with walking and/or transfers   Equipment Recommendations  Wheelchair (measurements PT);Wheelchair cushion (measurements PT);BSC/3in1    Recommendations for Other Services Rehab consult     Precautions / Restrictions Precautions Precautions: Fall Precaution Comments: R heel pain Restrictions Weight Bearing Restrictions: Yes RLE Weight Bearing: Non weight bearing Other Position/Activity Restrictions: Prafo on at all times     Mobility  Bed Mobility Overal bed mobility: Modified Independent Bed Mobility: Supine to Sit, Sit to Supine     Supine to sit: Modified independent (Device/Increase time), HOB elevated Sit to supine: Modified independent (Device/Increase time)        Transfers Overall transfer level: Needs assistance Equipment used: Rolling walker (2 wheels) Transfers: Sit to/from Stand Sit to Stand: Min assist, Mod assist, From elevated surface           General transfer comment: Patient requiring mod to min A for sit to stand from edge of bed x 5 reps. Bed elevated moderately. Patient requires cues for hand placment and NWB on RLE. He is able to maintain R NWB in standing. He was able to scoot in standing by pivoting on L LE to head of bed maintaining R NWB and min A.    Ambulation/Gait               General Gait Details: Did not attempt gait away from bed due to WB limitations and patient is easily fatigued.   Stairs             Wheelchair Mobility    Modified Rankin (Stroke Patients Only)       Balance  Overall balance assessment: Needs assistance Sitting-balance support: Feet supported Sitting balance-Leahy Scale: Good     Standing balance support: Bilateral upper extremity supported, During functional activity, Reliant on assistive device for balance Standing balance-Leahy Scale: Fair Standing balance comment: Mod to Min A for maintaining upright balance with posterior lean while adhering to WB precautions                             Cognition Arousal/Alertness: Awake/alert Behavior During Therapy: WFL for tasks assessed/performed Overall Cognitive Status: Within Functional Limits for tasks assessed                                          Exercises      General Comments        Pertinent Vitals/Pain Pain Assessment Pain Assessment: Faces Faces Pain Scale: Hurts a little bit Pain Location: R heel Pain Descriptors / Indicators: Grimacing, Discomfort Pain Intervention(s): Monitored during session, Premedicated before session, Repositioned    Home Living                          Prior Function            PT Goals (current goals can now be found in the care plan section) Acute Rehab PT Goals Patient Stated Goal: home, independence PT Goal Formulation: With patient Time For Goal Achievement: 07/11/22 Potential to Achieve Goals: Good Progress towards PT goals: Progressing toward goals    Frequency    Min 4X/week      PT Plan Discharge plan needs to be updated    Co-evaluation              AM-PAC PT "6 Clicks" Mobility   Outcome Measure  Help needed turning from your back to your side while in a flat bed without using bedrails?: None Help needed moving from lying on your back to sitting on the side of a flat bed without using bedrails?: A Little Help needed moving to and from a bed to a chair (including a wheelchair)?: A Lot Help needed standing up from a chair using your arms (e.g., wheelchair or bedside chair)?: A Lot Help needed to walk in hospital room?: Total Help needed climbing 3-5 steps with a railing? : Total 6 Click Score: 13    End of Session Equipment Utilized During Treatment: Gait belt Activity Tolerance: Patient tolerated treatment well Patient left: in bed;with bed alarm set;with call bell/phone within reach Nurse Communication: Mobility status PT Visit Diagnosis: Pain;Other abnormalities of gait and mobility (R26.89);Muscle  weakness (generalized) (M62.81) Pain - Right/Left: Right Pain - part of body: Ankle and joints of foot     Time: 5885-0277 PT Time Calculation (min) (ACUTE ONLY): 26 min  Charges:  $Therapeutic Activity: 23-37 mins                     Lawanda Holzheimer, PT, GCS 06/27/22,10:58 AM

## 2022-06-28 DIAGNOSIS — M86171 Other acute osteomyelitis, right ankle and foot: Secondary | ICD-10-CM | POA: Diagnosis not present

## 2022-06-28 LAB — CBC
HCT: 27.3 % — ABNORMAL LOW (ref 39.0–52.0)
Hemoglobin: 9 g/dL — ABNORMAL LOW (ref 13.0–17.0)
MCH: 26.8 pg (ref 26.0–34.0)
MCHC: 33 g/dL (ref 30.0–36.0)
MCV: 81.3 fL (ref 80.0–100.0)
Platelets: 343 10*3/uL (ref 150–400)
RBC: 3.36 MIL/uL — ABNORMAL LOW (ref 4.22–5.81)
RDW: 16.9 % — ABNORMAL HIGH (ref 11.5–15.5)
WBC: 7.8 10*3/uL (ref 4.0–10.5)
nRBC: 0 % (ref 0.0–0.2)

## 2022-06-28 LAB — BASIC METABOLIC PANEL
Anion gap: 6 (ref 5–15)
BUN: 47 mg/dL — ABNORMAL HIGH (ref 8–23)
CO2: 27 mmol/L (ref 22–32)
Calcium: 8.2 mg/dL — ABNORMAL LOW (ref 8.9–10.3)
Chloride: 99 mmol/L (ref 98–111)
Creatinine, Ser: 1.44 mg/dL — ABNORMAL HIGH (ref 0.61–1.24)
GFR, Estimated: 52 mL/min — ABNORMAL LOW (ref >60)
Glucose, Bld: 151 mg/dL — ABNORMAL HIGH (ref 70–99)
Potassium: 3.8 mmol/L (ref 3.5–5.1)
Sodium: 132 mmol/L — ABNORMAL LOW (ref 135–145)

## 2022-06-28 LAB — PHOSPHORUS: Phosphorus: 4.4 mg/dL (ref 2.5–4.6)

## 2022-06-28 LAB — GLUCOSE, CAPILLARY
Glucose-Capillary: 133 mg/dL — ABNORMAL HIGH (ref 70–99)
Glucose-Capillary: 150 mg/dL — ABNORMAL HIGH (ref 70–99)
Glucose-Capillary: 121 mg/dL — ABNORMAL HIGH (ref 70–99)
Glucose-Capillary: 134 mg/dL — ABNORMAL HIGH (ref 70–99)

## 2022-06-28 LAB — MAGNESIUM: Magnesium: 1.8 mg/dL (ref 1.7–2.4)

## 2022-06-28 MED ORDER — HYDRALAZINE HCL 10 MG PO TABS
10.0000 mg | ORAL_TABLET | Freq: Three times a day (TID) | ORAL | Status: DC
  Administered 2022-06-28 – 2022-06-30 (×6): 10 mg via ORAL
  Filled 2022-06-28 (×6): qty 1

## 2022-06-28 NOTE — Progress Notes (Signed)
[facility]  Subjective  - Feels good, pain controlled, eating breakfast   Objective 108/76 72 98 F (36.7 C) (Oral) 20 96%  Intake/Output Summary (Last 24 hours) at 06/28/2022 7741 Last data filed at 06/27/2022 2029 Gross per 24 hour  Intake --  Output 1800 ml  Net -1800 ml    Right groin soft Right leg incisions healing well Wound vac to suction, Heel protective boot placed General feels better mentally today  Assessment/Planning: 72 y.o. male is 10 days post op, s/p: right CFA endarterectomy with femoral to ATA bypass   He is more positive today ans states he feels better Good doppler AT signal.  Incisions healing well Dr. Lajoyce Corners is following the heel ulcer/wound vac Weight bearing status per Dr. Lajoyce Corners  Continue heparin, will need DOAC at discharge.  Continue aspirin and statin   Mosetta Pigeon 06/28/2022 8:39 AM --  Laboratory Lab Results: Recent Labs    06/27/22 0104 06/28/22 0255  WBC 8.7 7.8  HGB 8.9* 9.0*  HCT 28.2* 27.3*  PLT 312 343   BMET Recent Labs    06/27/22 0104 06/28/22 0255  NA 131* 132*  K 4.5 3.8  CL 98 99  CO2 28 27  GLUCOSE 132* 151*  BUN 43* 47*  CREATININE 1.51* 1.44*  CALCIUM 8.0* 8.2*    COAG Lab Results  Component Value Date   INR 1.0 01/11/2022   INR 0.9 01/30/2020   INR 1.0 12/11/2019   No results found for: "PTT"

## 2022-06-28 NOTE — Progress Notes (Signed)
PROGRESS NOTE    Caleb [name]  GEX:[id number] DOB: 11/13/1950 DOA: 06/07/2022 PCP: Hoy Register, MD  Chief Complaint  Patient presents with   Wound Check   Foot Pain    Brief Narrative:   Caleb [name] is Caleb [name] 72 year old man with PVD, DM 2, HTN, AAS s/p TAVR, HFrEF with Heli Dino EF of 35% send CKD 3 was admitted with recurrent diabetic foot infection and acute on chronic kidney injury. Workup has revealed acute osteomyelitis in the posterolateral aspect of the calcaneus. Seen by ID and per their recommendations, he is on doxycycline/Augmentin pending surgery/bone biopsy per ID recommendations, for which he is potentially scheduled on 06/22/2022 with Dr. Lajoyce Corners. Patient is being seen by vascular surgery , Status post angiogram 3/25, with right femoral-tibial bypass graft.  He's now s/p R partial calcaneus excision, excision of skin and soft tissue right heel as well.  At this time, managing pain.  Discharge is pending SNF placement (was evaluated by CIR, but pt's diagnoses not thought sufficient for payor approval).  Assessment & Plan:   Principal Problem:   Acute osteomyelitis of right calcaneus Active Problems:   Type 2 diabetes mellitus with hyperlipidemia   Acute kidney injury superimposed on chronic kidney disease   HTN (hypertension)   Hyperlipidemia   Hyponatremia   Dyslipidemia   PAD (peripheral artery disease)   S/P TAVR (transcatheter aortic valve replacement)   Decubitus ulcer of right heel   Diabetic foot ulcer   Gastroenteritis  Sepsis secondary to acute osteomyelitis right heel and PVD  -Sepsis POA  -Appreciate infectious disease -Antibiotics per ID, continue Augmentin.  Plan for 4 wks post op abx and ID follow up. -Aspirin, Plavix, lipitor  -Status post angiogram 3/25.  Status post femoral-tibial bypass.  Eliquis per vascular. -s/p R partial calcaneus excision, excision of skin and soft tissue R heel  - pending surgical cultures (rare gram positive cocci -> GBS among  mixed organisms)    AKI on CKD stage IIIb, resolved -Baseline Cr 1.5-2.5 -Appreciate nephrology -Thought to be secondary to hypovolemia while on Entresto.  Sherryll Burger was resumed on 06/13/2022.  He is getting intermittent IV Lasix.  Scheduled oral Lasix resumed on 06/20/2022 -> increase to 80 mg BID, more like his home regimen (80/40). -Nephrology signed off 3/22.  Follow-up with Dr. Marisue Humble later this month -Improving   HFrEF Exacerbation  Volume Overload Has LE edema, will adjust oral lasix  Strict I/O, daily weights Continue entresto Hydralazine, imdur  Will monitor, BP's soft today - will decrease dose of hydral  Hyponatremia Mild, follow with diuresis  Hypertension Follow on imdur, entresto, hydral, lasix  SOB - CXR without active disease, no complaints today - continue lasix as above  Hyperkalemia -Resolved   Diabetes mellitus type 2 with hyperglycemia Hold home 70/30 while inpatient.  Continue basal and mealtime with SSI.      DVT prophylaxis: heparin gtt Code Status: full Family Communication: none Disposition:   Status is: Inpatient Remains inpatient appropriate because: pending ortho procedure    Consultants:  Vascular ID ortho  Procedures:   4/5 RIGHT PARTIAL CALCANEUS EXCISION, excision of skin and soft tissue right heel.   3/25 Conscious sedation Ultrasound-guided access to the left common femoral artery CO2 aortogram and iliac arteriogram Selective catheterization of the right external iliac artery (second-order catheterization) with right lower extremity runoff Mynx closure of the left common femoral artery   Antimicrobials:  Anti-infectives (From admission, onward)    Start     [employer]  Frequency Ordered Stop   06/24/22 1300  ceFAZolin (ANCEF) IVPB 2g/100 mL premix        2 g 200 mL/hr over 30 Minutes Intravenous Every 8 hours 06/24/22 1016 06/24/22 1330   06/24/22 0600  ceFAZolin (ANCEF) IVPB 2g/100 mL premix        2 g 200  mL/hr over 30 Minutes Intravenous On call to O.R. 06/23/22 1747 06/24/22 0841   06/21/22 1000  amoxicillin-clavulanate (AUGMENTIN) 875-125 MG per tablet 1 tablet        1 tablet Oral Every 12 hours 06/21/22 0657     06/17/22 0740  ceFAZolin (ANCEF) 2-4 GM/100ML-% IVPB       Note to Pharmacy: Launa Flight M: cabinet override      06/17/22 0740 06/17/22 1944   06/16/22 1800  doxycycline (VIBRA-TABS) tablet 100 mg  Status:  Discontinued        100 mg Oral 2 times daily after meals 06/16/22 1600 06/26/22 1724   06/09/22 1245  doxycycline (VIBRA-TABS) tablet 100 mg  Status:  Discontinued        100 mg Oral Every 12 hours 06/09/22 1145 06/16/22 1600   06/09/22 1245  amoxicillin-clavulanate (AUGMENTIN) 500-125 MG per tablet 1 tablet  Status:  Discontinued        1 tablet Oral 2 times daily 06/09/22 1145 06/21/22 0657   06/08/22 1600  cefTRIAXone (ROCEPHIN) 2 g in sodium chloride 0.9 % 100 mL IVPB  Status:  Discontinued        2 g 200 mL/hr over 30 Minutes Intravenous Every 24 hours 06/08/22 0852 06/09/22 1145   06/08/22 1200  DAPTOmycin (CUBICIN) 600 mg in sodium chloride 0.9 % IVPB  Status:  Discontinued        8 mg/kg  77.4 kg 124 mL/hr over 30 Minutes Intravenous Every 48 hours 06/08/22 1012 06/09/22 1145   06/07/22 1415  linezolid (ZYVOX) IVPB 600 mg  Status:  Discontinued        600 mg 300 mL/hr over 60 Minutes Intravenous Every 12 hours 06/07/22 1414 06/08/22 0852   06/07/22 1415  ceFEPIme (MAXIPIME) 2 g in sodium chloride 0.9 % 100 mL IVPB  Status:  Discontinued        2 g 200 mL/hr over 30 Minutes Intravenous Daily 06/07/22 1414 06/08/22 0852       Subjective: Feels ok, doing well with gabapentin  Objective: Vitals:   06/26/22 2314 06/27/22 0329 06/27/22 0500 06/27/22 0746  BP: 99/65 91/73  109/77  Pulse: 76 66 68 81  Resp: 20 14 15 18   Temp: 98 F (36.7 C) 98.1 F (36.7 C)  99.2 F (37.3 C)  TempSrc: Oral Oral  Oral  SpO2: 97% 95% 97% 98%  Weight:   84.4 kg   Height:         Intake/Output Summary (Last 24 hours) at 06/27/2022 1451 Last data filed at 06/27/2022 0900 Gross per 24 hour  Intake 240 ml  Output 1400 ml  Net -1160 ml    Filed Weights   06/25/22 0500 06/26/22 0633 06/27/22 0500  Weight: 85.9 kg 89.4 kg 84.4 kg    Examination:  General: No acute distress. Cardiovascular: RRR Lungs: unlabored Neurological: Alert and oriented 3. Moves all extremities 4 with equal strength. Cranial nerves II through XII grossly intact. Extremities: R prafo boot  Data Reviewed: I have personally reviewed following labs and imaging studies  CBC: Recent Labs  Lab 06/24/22 0046 06/25/22 0706 06/25/22 1214 06/26/22 0118 06/27/22 0104  WBC 7.6 12.8* 11.9* 10.9* 8.7  NEUTROABS  --  9.7*  --   --   --   HGB 9.9* 10.5* 10.1* 9.5* 8.9*  HCT 29.5* 32.1* 30.1* 28.6* 28.2*  MCV 80.6 81.5 80.7 81.3 83.2  PLT 327 352 348 295 312    Basic Metabolic Panel: Recent Labs  Lab 06/24/22 0046 06/25/22 0706 06/25/22 1214 06/26/22 0118 06/27/22 0104  NA 132* 132* 131* 129* 131*  K 4.7 5.0 4.2 4.3 4.5  CL 99 98 96* 98 98  CO2 24 23 25 25 28   GLUCOSE 129* 150* 152* 184* 132*  BUN 30* 32* 35* 42* 43*  CREATININE 1.54* 1.65* 1.61* 1.48* 1.51*  CALCIUM 8.4* 8.5* 8.5* 7.9* 8.0*  MG  --  1.7  --  1.7 1.8  PHOS  --  3.8  --  3.4 3.6    GFR: Estimated Creatinine Clearance: 45.9 mL/min (Caleb [name]) (by C-G formula based on SCr of 1.51 mg/dL (H)).  Liver Function Tests: Recent Labs  Lab 06/25/22 0706 06/26/22 0118  AST 16 15  ALT 11 11  ALKPHOS 91 90  BILITOT 0.6 0.5  PROT 6.7 6.2*  ALBUMIN 2.3* 2.0*    CBG: Recent Labs  Lab 06/26/22 1643 06/26/22 2110 06/27/22 0535 06/27/22 1007 06/27/22 1208  GLUCAP 118* 210* 89 116* 110*     Recent Results (from the past 240 hour(s))  Aerobic/Anaerobic Culture w Gram Stain (surgical/deep wound)     Status: Abnormal (Preliminary result)   Collection Time: 06/24/22  8:55 AM   Specimen: Soft Tissue, Other  Result  Value Ref Range Status   Specimen Description TISSUE  Final   Special Requests  CALCANEUS  Final   Gram Stain NO WBC SEEN RARE GRAM POSITIVE COCCI   Final   Culture (Tashiana Lamarca)  Final    GROUP B STREP(S.AGALACTIAE)ISOLATED TESTING AGAINST S. AGALACTIAE NOT ROUTINELY PERFORMED DUE TO PREDICTABILITY OF AMP/PEN/VAN SUSCEPTIBILITY. AMONG MIXED ORGANISMS CULTURE REINCUBATED FOR BETTER GROWTH HOLDING FOR POSSIBLE ANAEROBE Performed at [facility], [address] [place], [place] [address]    Report Status PENDING  Incomplete         Radiology Studies: No results found.      Scheduled Meds:  acetaminophen  1,000 mg Oral Q8H   amoxicillin-clavulanate  1 tablet Oral Q12H   apixaban  5 mg Oral BID   vitamin C  1,000 mg Oral Daily   aspirin  81 mg Oral Daily   atorvastatin  40 mg Oral Daily   carvedilol  12.5 mg Oral BID WC   Chlorhexidine Gluconate Cloth  6 each Topical Daily   cholecalciferol  1,000 Units Oral Daily   cyanocobalamin  500 mcg Oral q AM   docusate sodium  100 mg Oral Daily   ferrous sulfate  325 mg Oral Q breakfast   furosemide  80 mg Oral BID   gabapentin  300 mg Oral BID WC   gabapentin  800 mg Oral QHS   hydrALAZINE  25 mg Oral TID   insulin aspart  0-15 Units Subcutaneous TID WC   insulin aspart  0-5 Units Subcutaneous QHS   insulin aspart  3 Units Subcutaneous TID WC   insulin detemir  5 Units Subcutaneous BID   isosorbide mononitrate  30 mg Oral Daily   nutrition supplement (JUVEN)  1 packet Oral BID BM   pantoprazole  40 mg Oral Daily   sacubitril-valsartan  1 tablet Oral BID   zinc sulfate  220 mg  Oral Daily   Continuous Infusions:  sodium chloride     sodium chloride     magnesium sulfate bolus IVPB       LOS: 20 days    Time spent: over 30 min    Lacretia Nicksaldwell Powell, MD [facility]   To contact the attending provider between 7A-7P or the covering provider during after hours 7P-7A, please log into the web site [email or web address] and  access using universal [place] password for that web site. If you do not have the password, please call the hospital operator.  06/27/2022, 2:51 PM

## 2022-06-28 NOTE — Care Management Important Message (Signed)
Important Message  Patient Details  Name: Caleb [name] MRN: [id number] Date of Birth: Jun 01, 1950   Medicare Important Message Given:  Yes     Renie Ora 06/28/2022, 11:24 AM

## 2022-06-28 NOTE — Progress Notes (Signed)
Physical Therapy Treatment Patient Details Name: Caleb [name] MRN: [id number] DOB: 08-16-1950 Today's Date: 06/28/2022   History of Present Illness 72 y.o. male admitted 3/19 with Rt calcaneal osteomyelitis. RLE angiogram 3/25. 3/29 Rt SFA to anterior tib BPG with Rt SFA endarterectomy, 4/5 excision of necrotic heel ulcer and partial calcaneal excision. PMH: IDDM, GERD, HTN, HLD, hyponatremia, chronic systolic CHF with EF 35-40%, TAVR, PVD, neuropathy.    PT Comments    Pt pleasant and willing to attempt mobility however fatigues quickly in standing with increased pain with RLE dependent. Pt able to pivot to chair but could not progress gait further or attempt repeated standing trials this session. Pt educated for removing PRAFO for ankle ROM throughout the day and TDWB status. Plan remains appropriate.     Recommendations for follow up therapy are one component of a multi-disciplinary discharge planning process, led by the attending physician.  Recommendations may be updated based on patient status, additional functional criteria and insurance authorization.  Follow Up Recommendations  Can patient physically be transported by private vehicle: No    Assistance Recommended at Discharge Intermittent Supervision/Assistance  Patient can return home with the following Assistance with cooking/housework;Assist for transportation;Help with stairs or ramp for entrance;A lot of help with walking and/or transfers;A lot of help with bathing/dressing/bathroom   Equipment Recommendations  Wheelchair (measurements PT);Wheelchair cushion (measurements PT);BSC/3in1    Recommendations for Other Services       Precautions / Restrictions Precautions Precautions: Fall Precaution Comments: RLE pain, wound vac Restrictions Weight Bearing Restrictions: Yes RLE Weight Bearing: Touchdown weight bearing Other Position/Activity Restrictions: Prafo on at all times     Mobility  Bed Mobility Overal bed  mobility: Needs Assistance Bed Mobility: Supine to Sit     Supine to sit: Supervision, HOB elevated     General bed mobility comments: HOB 20 degrees with increased time    Transfers Overall transfer level: Needs assistance Equipment used: Rolling walker (2 wheels) Transfers: Sit to/from Stand Sit to Stand: Min assist Stand pivot transfers: Min guard         General transfer comment: min assist to rise to standing from low surface with cues for hand placement and positioning of Rt foot. Pt able to stand and performing limiting hopping to pivot to chair with guarding    Ambulation/Gait               General Gait Details: unable due to fatigue and pain with limited standing   Stairs             Wheelchair Mobility    Modified Rankin (Stroke Patients Only)       Balance Overall balance assessment: Needs assistance Sitting-balance support: Feet supported Sitting balance-Leahy Scale: Good     Standing balance support: Bilateral upper extremity supported, Reliant on assistive device for balance Standing balance-Leahy Scale: Poor Standing balance comment: bil UE on RW to maintain TDWB                            Cognition Arousal/Alertness: Awake/alert Behavior During Therapy: WFL for tasks assessed/performed Overall Cognitive Status: Within Functional Limits for tasks assessed                                          Exercises General Exercises - Lower Extremity Ankle Circles/Pumps: AROM, Right, Seated, 20  reps Long [employer]: AROM, Right, Seated, 20 reps Hip Flexion/Marching: AROM, Right, Seated, 20 reps    General Comments        Pertinent Vitals/Pain Pain Assessment Pain Score: 8  Pain Location: RLE with dependent position pain increased from 8-10 Pain Descriptors / Indicators: Grimacing, Discomfort Pain Intervention(s): Limited activity within patient's tolerance, Repositioned, Monitored during session,  Premedicated before session, Patient requesting pain meds-RN notified    Home Living                          Prior Function            PT Goals (current goals can now be found in the care plan section) Progress towards PT goals: Progressing toward goals    Frequency    Min 1X/week      PT Plan Current plan remains appropriate    Co-evaluation              AM-PAC PT "6 Clicks" Mobility   Outcome Measure  Help needed turning from your back to your side while in a flat bed without using bedrails?: None Help needed moving from lying on your back to sitting on the side of a flat bed without using bedrails?: A Little Help needed moving to and from a bed to a chair (including a wheelchair)?: A Little Help needed standing up from a chair using your arms (e.g., wheelchair or bedside chair)?: A Little Help needed to walk in hospital room?: Total Help needed climbing 3-5 steps with a railing? : Total 6 Click Score: 15    End of Session Equipment Utilized During Treatment: Gait belt Activity Tolerance: Patient tolerated treatment well Patient left: in chair;with call bell/phone within reach Nurse Communication: Mobility status PT Visit Diagnosis: Pain;Other abnormalities of gait and mobility (R26.89);Muscle weakness (generalized) (M62.81) Pain - Right/Left: Right Pain - part of body: Leg     Time: 2423-5361 PT Time Calculation (min) (ACUTE ONLY): 31 min  Charges:  $Therapeutic Exercise: 8-22 mins $Therapeutic Activity: 8-22 mins                     Merryl Hacker, PT Acute Rehabilitation Services Office: [phone]    Enedina Finner Jaleen Finch 06/28/2022, 10:59 AM

## 2022-06-29 DIAGNOSIS — M86171 Other acute osteomyelitis, right ankle and foot: Secondary | ICD-10-CM | POA: Diagnosis not present

## 2022-06-29 LAB — CBC
HCT: 29.7 % — ABNORMAL LOW (ref 39.0–52.0)
Hemoglobin: 9.9 g/dL — ABNORMAL LOW (ref 13.0–17.0)
MCH: 26.9 pg (ref 26.0–34.0)
MCHC: 33.3 g/dL (ref 30.0–36.0)
MCV: 80.7 fL (ref 80.0–100.0)
Platelets: 380 10*3/uL (ref 150–400)
RBC: 3.68 MIL/uL — ABNORMAL LOW (ref 4.22–5.81)
RDW: 16.6 % — ABNORMAL HIGH (ref 11.5–15.5)
WBC: 8.2 10*3/uL (ref 4.0–10.5)
nRBC: 0 % (ref 0.0–0.2)

## 2022-06-29 LAB — AEROBIC/ANAEROBIC CULTURE W GRAM STAIN (SURGICAL/DEEP WOUND)

## 2022-06-29 LAB — GLUCOSE, CAPILLARY
Glucose-Capillary: 103 mg/dL — ABNORMAL HIGH (ref 70–99)
Glucose-Capillary: 119 mg/dL — ABNORMAL HIGH (ref 70–99)
Glucose-Capillary: 109 mg/dL — ABNORMAL HIGH (ref 70–99)
Glucose-Capillary: 204 mg/dL — ABNORMAL HIGH (ref 70–99)
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
## 2022-06-29 NOTE — Progress Notes (Addendum)
[facility]  Subjective  - Doing well feeling more positive   Objective 103/61 76 98.1 F (36.7 C) (Oral) 11 97%  Intake/Output Summary (Last 24 hours) at 06/29/2022 0708 Last data filed at 06/28/2022 2000 Gross per 24 hour  Intake 240 ml  Output --  Net 240 ml    Doppler AT signals on the  right LE Vac with good seal right Heel Right groin soft without hematoma Lungs non labored breathing  Assessment/Planning: POD # 11 s/p: right CFA endarterectomy with femoral to ATA bypass   Patent bypass with AT signal Per Dr. Lajoyce Corners Cultures are showing gram-positive cocci. Patient may discharge to home on antibiotic coverage once cultures are finalized. I will follow-up in the office 1 week after discharge. Patient will transition to the [facility] plus portable wound VAC pump for discharge.   Continue heparin, will need DOAC at discharge. Continue aspirin and statin    Mosetta Pigeon 06/29/2022 7:08 AM --  VASCULAR STAFF ADDENDUM: I have independently interviewed and examined the patient. I agree with the above.  Please continue ACE or TED hose to the groin. Some serous drainage from the thigh at the saphenectomy sight. Drainage should improve with compression. Leg edematous  Okay for dc from my perspective.  Excellent DP signal.   Fara Olden, MD [facility] Phone Number: [phone] 06/29/2022 11:00 AM    Laboratory Lab Results: Recent Labs    06/28/22 0255 06/29/22 0126  WBC 7.8 8.2  HGB 9.0* 9.9*  HCT 27.3* 29.7*  PLT 343 380   BMET Recent Labs    06/27/22 0104 06/28/22 0255  NA 131* 132*  K 4.5 3.8  CL 98 99  CO2 28 27  GLUCOSE 132* 151*  BUN 43* 47*  CREATININE 1.51* 1.44*  CALCIUM 8.0* 8.2*    COAG Lab Results  Component Value Date   INR 1.0 01/11/2022   INR 0.9 01/30/2020   INR 1.0 12/11/2019   No results found for: "PTT"

## 2022-06-29 NOTE — Progress Notes (Signed)
ANTICOAGULATION CONSULT NOTE  Pharmacy Consult for apixaban Indication: critical limb ischemia   Allergies  Allergen Reactions   Codeine Nausea And Vomiting   Norco [Hydrocodone-Acetaminophen] Nausea And Vomiting   Percocet [Oxycodone-Acetaminophen] Nausea And Vomiting   Tramadol Hcl Nausea And Vomiting    Patient Measurements: Height: 5\' 7"  (170.2 cm) Weight: 85.4 kg (188 lb 3.2 oz) IBW/kg (Calculated) : 66.1 HEPARIN DW (KG): 83.8   Vital Signs: Temp: 98.4 F (36.9 C) (04/10 0817) Temp Source: Oral (04/10 0817) BP: 114/69 (04/10 0817) Pulse Rate: 83 (04/10 0817)  Labs: Recent Labs    06/27/22 0104 06/28/22 0255 06/29/22 0126  HGB 8.9* 9.0* 9.9*  HCT 28.2* 27.3* 29.7*  PLT 312 343 380  CREATININE 1.51* 1.44*  --      Estimated Creatinine Clearance: 48.4 mL/min (A) (by C-G formula based on SCr of 1.44 mg/dL (H)).   Medical History: Past Medical History:  Diagnosis Date   Carotid artery occlusion    Diabetes mellitus without complication    Type II   Dyslipidemia 09/27/2012   Erectile dysfunction 09/27/2012   GERD (gastroesophageal reflux disease)    Hypercholesteremia    Hyperlipidemia 08/12/2012   Hypertension    Hyponatremia 08/14/2012   Neuropathy    Pancreatitis    Pancreatitis, acute 09/27/2012   Peripheral vascular disease    S/P 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 Excell [employer]   Severe aortic stenosis    Vitamin D deficiency 06/23/2015      Assessment: 25 you male with critical limb ischemia s/p R CFA endarterectomy with femoral to ATA bypass on 3/30. Patient has been on heparin. Pharmacy consulted to dose apixaban on 4/7.   CBC: Hgb stable 9.9, Plts WNL SCr: improved 1.44 No s/sx bleeding noted.  Remains on aspirin 81mg  daily; plavix stopped  Goal of Therapy:  Therapeutic anticoagulation  Plan: Continue apixaban 5 mg BID  Monitor CBC for s/sx  bleeding  Patient educated on therapy, copay check $45/month   Loralee Pacas, PharmD, BCPS 06/29/2022 10:31 AM

## 2022-06-29 NOTE — Progress Notes (Signed)
PROGRESS NOTE    Caleb [name]  ZYY:[id number] DOB: 1950/07/21 DOA: 06/07/2022 PCP: Hoy Register, MD   Brief Narrative:  Caleb [name] is a 72 year old man with PVD, DM 2, HTN, AAS s/p TAVR, HFrEF with a EF of 35% send CKD 3 was admitted with recurrent diabetic foot infection and acute on chronic kidney injury. Workup has revealed acute osteomyelitis in the posterolateral aspect of the calcaneus. Seen by ID and per their recommendations, he is on doxycycline/Augmentin pending surgery/bone biopsy per ID recommendations, for which he is potentially scheduled on 06/22/2022 with Dr. Lajoyce Corners. Patient is being seen by vascular surgery , Status post angiogram 3/25, with right femoral-tibial bypass graft.  He's now s/p R partial calcaneus excision, excision of skin and soft tissue right heel as well.   At this time, managing pain.  Discharge is pending SNF placement (was evaluated by CIR, but pt's diagnoses not thought sufficient for payor approval).  Assessment & Plan:   Principal Problem:   Acute osteomyelitis of right calcaneus Active Problems:   Type 2 diabetes mellitus with hyperlipidemia   Acute kidney injury superimposed on chronic kidney disease   HTN (hypertension)   Hyperlipidemia   Hyponatremia   Dyslipidemia   PAD (peripheral artery disease)   S/P TAVR (transcatheter aortic valve replacement)   Decubitus ulcer of right heel   Diabetic foot ulcer   Gastroenteritis  Sepsis secondary to acute osteomyelitis right heel and PVD, POA: -Antibiotics per ID, continue Augmentin.  Plan for 4 wks post op abx (end date 07/22/2022) and ID follow up (has an appointment on 07/12/2022 at [facility] with Dr. Elinor Parkinson). -Aspirin, Plavix, lipitor  -Status post angiogram 3/25.  Status post femoral-tibial bypass.  Eliquis per vascular. -s/p R partial calcaneus excision, excision of skin and soft tissue R heel  -surgical cultures (rare gram positive cocci -> GBS among mixed organisms)     AKI on CKD stage  IIIb, resolved -Baseline Cr 1.5-2.5 -Appreciate nephrology -Thought to be secondary to hypovolemia while on Entresto.  Sherryll Burger was resumed on 06/13/2022.  He initially seemed intermittent IV Lasix.  Scheduled oral Lasix resumed on 06/20/2022 -> increase to 80 mg BID, more like his home regimen (80/40). -Nephrology signed off 3/22.  Follow-up with Dr. Marisue Humble later this month   HFrEF Exacerbation  Volume Overload Stable.  Continue entresto Hydralazine, imdur  BP on the lower side.  Hydralazine decreased to 10 mg p.o. 3 times daily on 06/28/2022.   Hyponatremia Mild, follow with diuresis   Hypertension Slightly on the lower side.  Patient is symptomatic.  Follow on imdur, entresto, hydral, lasix   SOB - CXR without active disease, no complaints today   Hyperkalemia -Resolved   Diabetes mellitus type 2 with hyperglycemia Hold home 70/30 while inpatient.  Continue basal and mealtime with SSI.  Blood sugar controlled.  Disposition: Per previous notes, patient was medically stable and at this point in time, only waiting for placement to SNF by TOC.  When discussed with the patient today, he seemed surprised and was unaware of the plans.  He said that no one has talked to him about discharging to SNF and that he would like to talk to Child psychotherapist.  Discussed with the social worker, was informed that they had talked to the patient yesterday and provided him with the options but he has not made choices yet.  Once he makes choice, insurance authorization will be required.  Unlikely that he will be discharged today.  DVT prophylaxis: SCD's  Start: 06/24/22 1016 Place TED hose Start: 06/22/22 0735 Place TED hose Start: 06/21/22 0823 SCD's Start: 06/17/22 1526 SCDs Start: 06/07/22 1348   Code Status: Full Code  Family Communication:  None present at bedside.  Plan of care discussed with patient in length and he/she verbalized understanding and agreed with it.  Status is: Inpatient Remains  inpatient appropriate because: Medically stable, no procedure planned.  Pending placement to SNF.  TOC working on that.   Estimated body mass index is 29.48 kg/m as calculated from the following:   Height as of this encounter: 5\' 7"  (1.702 m).   Weight as of this encounter: 85.4 kg.    Nutritional Assessment: Body mass index is 29.48 kg/m.Marland Kitchen Seen by dietician.  I agree with the assessment and plan as outlined below: Nutrition Status:        . Skin Assessment: I have examined the patient's skin and I agree with the wound assessment as performed by the wound care RN as outlined below:    Consultants:  Orthopedics, ID and vascular surgery  Procedures:  As above  Antimicrobials:  Anti-infectives (From admission, onward)    Start     Dose/Rate Route Frequency Ordered Stop   06/24/22 1300  ceFAZolin (ANCEF) IVPB 2g/100 mL premix        2 g 200 mL/hr over 30 Minutes Intravenous Every 8 hours 06/24/22 1016 06/24/22 1330   06/24/22 0600  ceFAZolin (ANCEF) IVPB 2g/100 mL premix        2 g 200 mL/hr over 30 Minutes Intravenous On call to O.R. 06/23/22 1747 06/24/22 0841   06/21/22 1000  amoxicillin-clavulanate (AUGMENTIN) 875-125 MG per tablet 1 tablet        1 tablet Oral Every 12 hours 06/21/22 0657     06/17/22 0740  ceFAZolin (ANCEF) 2-4 GM/100ML-% IVPB       Note to Pharmacy: Launa Flight M: cabinet override      06/17/22 0740 06/17/22 1944   06/16/22 1800  doxycycline (VIBRA-TABS) tablet 100 mg  Status:  Discontinued        100 mg Oral 2 times daily after meals 06/16/22 1600 06/26/22 1724   06/09/22 1245  doxycycline (VIBRA-TABS) tablet 100 mg  Status:  Discontinued        100 mg Oral Every 12 hours 06/09/22 1145 06/16/22 1600   06/09/22 1245  amoxicillin-clavulanate (AUGMENTIN) 500-125 MG per tablet 1 tablet  Status:  Discontinued        1 tablet Oral 2 times daily 06/09/22 1145 06/21/22 0657   06/08/22 1600  cefTRIAXone (ROCEPHIN) 2 g in sodium chloride 0.9 % 100 mL IVPB   Status:  Discontinued        2 g 200 mL/hr over 30 Minutes Intravenous Every 24 hours 06/08/22 0852 06/09/22 1145   06/08/22 1200  DAPTOmycin (CUBICIN) 600 mg in sodium chloride 0.9 % IVPB  Status:  Discontinued        8 mg/kg  77.4 kg 124 mL/hr over 30 Minutes Intravenous Every 48 hours 06/08/22 1012 06/09/22 1145   06/07/22 1415  linezolid (ZYVOX) IVPB 600 mg  Status:  Discontinued        600 mg 300 mL/hr over 60 Minutes Intravenous Every 12 hours 06/07/22 1414 06/08/22 0852   06/07/22 1415  ceFEPIme (MAXIPIME) 2 g in sodium chloride 0.9 % 100 mL IVPB  Status:  Discontinued        2 g 200 mL/hr over 30 Minutes Intravenous Daily 06/07/22 1414 06/08/22 0852  Subjective: Patient seen and examined.  He has no complaints.  Objective: Vitals:   06/28/22 1648 06/28/22 1946 06/29/22 0200 06/29/22 0817  BP: 105/61 132/72 103/61 114/69  Pulse: 76   83  Resp: 13 18 11 13   Temp: 97.6 F (36.4 C) 98 F (36.7 C) 98.1 F (36.7 C) 98.4 F (36.9 C)  TempSrc: Oral Oral Oral Oral  SpO2:  97%    Weight:      Height:        Intake/Output Summary (Last 24 hours) at 06/29/2022 0942 Last data filed at 06/29/2022 [id number] Gross per 24 hour  Intake 480 ml  Output --  Net 480 ml   Filed Weights   06/26/22 0633 06/27/22 0500 06/28/22 0153  Weight: 89.4 kg 84.4 kg 85.4 kg    Examination:  General exam: Appears calm and comfortable  Respiratory system: Clear to auscultation. Respiratory effort normal. Cardiovascular system: S1 & S2 heard, RRR. No JVD, murmurs, rubs, gallops or clicks. No pedal edema. Gastrointestinal system: Abdomen is nondistended, soft and nontender. No organomegaly or masses felt. Normal bowel sounds heard. Central nervous system: Alert and oriented. No focal neurological deficits. Extremities: Symmetric 5 x 5 power. Skin: No rashes, lesions or ulcers Psychiatry: Judgement and insight appear normal. Mood & affect appropriate.    Data Reviewed: I have personally  reviewed following labs and imaging studies  CBC: Recent Labs  Lab 06/25/22 0706 06/25/22 1214 06/26/22 0118 06/27/22 0104 06/28/22 0255 06/29/22 0126  WBC 12.8* 11.9* 10.9* 8.7 7.8 8.2  NEUTROABS 9.7*  --   --   --   --   --   HGB 10.5* 10.1* 9.5* 8.9* 9.0* 9.9*  HCT 32.1* 30.1* 28.6* 28.2* 27.3* 29.7*  MCV 81.5 80.7 81.3 83.2 81.3 80.7  PLT 352 348 295 312 343 380   Basic Metabolic Panel: Recent Labs  Lab 06/25/22 0706 06/25/22 1214 06/26/22 0118 06/27/22 0104 06/28/22 0255  NA 132* 131* 129* 131* 132*  K 5.0 4.2 4.3 4.5 3.8  CL 98 96* 98 98 99  CO2 23 25 25 28 27   GLUCOSE 150* 152* 184* 132* 151*  BUN 32* 35* 42* 43* 47*  CREATININE 1.65* 1.61* 1.48* 1.51* 1.44*  CALCIUM 8.5* 8.5* 7.9* 8.0* 8.2*  MG 1.7  --  1.7 1.8 1.8  PHOS 3.8  --  3.4 3.6 4.4   GFR: Estimated Creatinine Clearance: 48.4 mL/min (A) (by C-G formula based on SCr of 1.44 mg/dL (H)). Liver Function Tests: Recent Labs  Lab 06/25/22 0706 06/26/22 0118  AST 16 15  ALT 11 11  ALKPHOS 91 90  BILITOT 0.6 0.5  PROT 6.7 6.2*  ALBUMIN 2.3* 2.0*   No results for input(s): "LIPASE", "AMYLASE" in the last 168 hours. No results for input(s): "AMMONIA" in the last 168 hours. Coagulation Profile: No results for input(s): "INR", "PROTIME" in the last 168 hours. Cardiac Enzymes: No results for input(s): "CKTOTAL", "CKMB", "CKMBINDEX", "TROPONINI" in the last 168 hours. BNP (last 3 results) No results for input(s): "PROBNP" in the last 8760 hours. HbA1C: No results for input(s): "HGBA1C" in the last 72 hours. CBG: Recent Labs  Lab 06/28/22 0553 06/28/22 1148 06/28/22 1650 06/28/22 2115 06/29/22 0601  GLUCAP 133* 150* 121* 134* 103*   Lipid Profile: No results for input(s): "CHOL", "HDL", "LDLCALC", "TRIG", "CHOLHDL", "LDLDIRECT" in the last 72 hours. Thyroid Function Tests: No results for input(s): "TSH", "T4TOTAL", "FREET4", "T3FREE", "THYROIDAB" in the last 72 hours. Anemia Panel: No  results for input(s): "  VITAMINB12", "FOLATE", "FERRITIN", "TIBC", "IRON", "RETICCTPCT" in the last 72 hours. Sepsis Labs: No results for input(s): "PROCALCITON", "LATICACIDVEN" in the last 168 hours.  Recent Results (from the past 240 hour(s))  Aerobic/Anaerobic Culture w Gram Stain (surgical/deep wound)     Status: Abnormal (Preliminary result)   Collection Time: 06/24/22  8:55 AM   Specimen: Soft Tissue, Other  Result Value Ref Range Status   Specimen Description TISSUE  Final   Special Requests  CALCANEUS  Final   Gram Stain   Final    NO WBC SEEN RARE GRAM POSITIVE COCCI Performed at [facility], [address] [place], [place] [address]    Culture (A)  Final    GROUP B STREP(S.AGALACTIAE)ISOLATED TESTING AGAINST S. AGALACTIAE NOT ROUTINELY PERFORMED DUE TO PREDICTABILITY OF AMP/PEN/VAN SUSCEPTIBILITY. AMONG MIXED ORGANISMS NO ANAEROBES ISOLATED; CULTURE IN PROGRESS FOR 5 DAYS    Report Status PENDING  Incomplete     Radiology Studies: No results found.  Scheduled Meds:  amoxicillin-clavulanate  1 tablet Oral Q12H   apixaban  5 mg Oral BID   vitamin C  1,000 mg Oral Daily   aspirin  81 mg Oral Daily   atorvastatin  40 mg Oral Daily   carvedilol  12.5 mg Oral BID WC   Chlorhexidine Gluconate Cloth  6 each Topical Daily   cholecalciferol  1,000 Units Oral Daily   cyanocobalamin  500 mcg Oral q AM   docusate sodium  100 mg Oral Daily   ferrous sulfate  325 mg Oral Q breakfast   furosemide  80 mg Oral BID   gabapentin  300 mg Oral BID WC   gabapentin  800 mg Oral QHS   hydrALAZINE  10 mg Oral TID   insulin aspart  0-15 Units Subcutaneous TID WC   insulin aspart  0-5 Units Subcutaneous QHS   insulin aspart  3 Units Subcutaneous TID WC   insulin detemir  5 Units Subcutaneous BID   isosorbide mononitrate  30 mg Oral Daily   nutrition supplement (JUVEN)  1 packet Oral BID BM   pantoprazole  40 mg Oral Daily   sacubitril-valsartan  1 tablet Oral BID   zinc  sulfate  220 mg Oral Daily   Continuous Infusions:  sodium chloride     sodium chloride     magnesium sulfate bolus IVPB       LOS: 22 days   Hughie Closs, MD [facility]  06/29/2022, 9:42 AM   *Please note that this is a verbal dictation therefore any spelling or grammatical errors are due to the "Dragon Medical One" system interpretation.  Please page via Amion and do not message via secure chat for urgent patient care matters. Secure chat can be used for non urgent patient care matters.  How to contact the [facility] Attending or Consulting provider 7A - 7P or covering provider during after hours 7P -7A, for this patient?  Check the care team in [facility] and look for a) attending/consulting TRH provider listed and b) the [facility] team listed. Page or secure chat 7A-7P. Log into [email or web address] and use [place]'s universal password to access. If you do not have the password, please contact the hospital operator. Locate the [facility] provider you are looking for under [facility] and page to a number that you can be directly reached. If you still have difficulty reaching the provider, please page the [facility] (Director on Call) for the Hospitalists listed on amion for assistance.

## 2022-06-29 NOTE — Progress Notes (Signed)
Occupational Therapy Treatment Patient Details Name: Caleb [name] MRN: [id number] DOB: 17-Oct-1950 Today's Date: 06/29/2022   History of present illness 72 y.o. male admitted 3/19 with Rt calcaneal osteomyelitis. RLE angiogram 3/25. 3/29 Rt SFA to anterior tib BPG with Rt SFA endarterectomy, 4/5 excision of necrotic heel ulcer and partial calcaneal excision. PMH: IDDM, GERD, HTN, HLD, hyponatremia, chronic systolic CHF with EF 35-40%, TAVR, PVD, neuropathy.   OT comments  Pt continuing to progress towards patient focused goals but remains limited by pain in RLE with excess movement and NWB status. Pt currently displaying strength to pull self up with use of Huntley Dec steady to hold balance, focused OT session on standing endurance. Pt able to tolerate standing at sink 4 mins before fatigued, noted increased cramp in RLE. Pt demo'd good carryover of weightbearing precautions throughout OT session. OT to continue to progress patient as able, DC plans remain appropriate at this time.    Recommendations for follow up therapy are one component of a multi-disciplinary discharge planning process, led by the attending physician.  Recommendations may be updated based on patient status, additional functional criteria and insurance authorization.    Assistance Recommended at Discharge Frequent or constant Supervision/Assistance  Patient can return home with the following  A little help with bathing/dressing/bathroom;Assistance with cooking/housework;Assist for transportation;Help with stairs or ramp for entrance;A lot of help with walking and/or transfers   Equipment Recommendations  Other (comment) (Defer to next level of care)    Recommendations for Other Services      Precautions / Restrictions         Mobility Bed Mobility Overal bed mobility: Needs Assistance Bed Mobility: Supine to Sit     Supine to sit: Supervision, HOB elevated Sit to supine: Supervision        Transfers Overall  transfer level: Needs assistance Equipment used: Ambulation equipment used Huntley Dec steady)   Sit to Stand: Min guard (4x STS)           General transfer comment: Pt demo's ability to pull self into standing with use of sara steady, tolerated 4 mins of consecutive standing endurance     Balance Overall balance assessment: Needs assistance Sitting-balance support: Feet supported Sitting balance-Leahy Scale: Good     Standing balance support: During functional activity, Reliant on assistive device for balance, Bilateral upper extremity supported Standing balance-Leahy Scale: Poor                             ADL either performed or assessed with clinical judgement   ADL Overall ADL's : Needs assistance/impaired     Grooming: Wash/dry hands;Sitting;Total assistance Grooming Details (indicate cue type and reason): Huntley Dec steady used to position patient at sink             Lower Body Dressing: Maximal assistance;Sitting/lateral leans Lower Body Dressing Details (indicate cue type and reason): Max A to don L Research scientist (physical sciences): Total assistance Toilet Transfer Details (indicate cue type and reason): Huntley Dec steady, Pt declined pivot to [facility] Toileting- Clothing Manipulation and Hygiene: Sitting/lateral lean;Supervision/safety Toileting - Clothing Manipulation Details (indicate cue type and reason): Pt performed rear pericare with Supervision while sitting on toilet       General ADL Comments: Huntley Dec steady used during OT session    Extremity/Trunk Assessment              Vision       Perception     Praxis  Cognition Arousal/Alertness: Awake/alert Behavior During Therapy: WFL for tasks assessed/performed Overall Cognitive Status: Within Functional Limits for tasks assessed                                          Exercises Other Exercises Other Exercises: Marching in place 3x10 reps RLE (TTWB)    Shoulder Instructions        General Comments VSS on RA    Pertinent Vitals/ Pain       Pain Assessment Pain Assessment: No/denies pain  Home Living                                          Prior Functioning/Environment              Frequency  Min 2X/week        Progress Toward Goals  OT Goals(current goals can now be found in the care plan section)  Progress towards OT goals: Progressing toward goals  Acute Rehab OT Goals Patient Stated Goal: To get back to doing a light jog OT Goal Formulation: With patient Time For Goal Achievement: 07/11/22 Potential to Achieve Goals: Good  Plan Discharge plan remains appropriate;Frequency remains appropriate    Co-evaluation                 AM-PAC OT "6 Clicks" Daily Activity     Outcome Measure   Help from another person eating meals?: None Help from another person taking care of personal grooming?: None Help from another person toileting, which includes using toliet, bedpan, or urinal?: A Lot Help from another person bathing (including washing, rinsing, drying)?: A Lot Help from another person to put on and taking off regular upper body clothing?: A Little Help from another person to put on and taking off regular lower body clothing?: A Lot 6 Click Score: 17    End of Session Equipment Utilized During Treatment: Gait belt;Other (comment) Huntley Dec Steady)  OT Visit Diagnosis: Unsteadiness on feet (R26.81);Pain Pain - Right/Left: Right Pain - part of body: Ankle and joints of foot   Activity Tolerance Patient tolerated treatment well   Patient Left in bed;with call bell/phone within reach   Nurse Communication Mobility status        Time: 1505-6979 OT Time Calculation (min): 41 min  Charges: OT General Charges $OT Visit: 1 Visit OT Treatments $Self Care/Home Management : 8-22 mins $Therapeutic Activity: 23-37 mins  06/29/2022  Caleb [name], OTR/L  Acute Rehabilitation Services  Office: [phone]   Caleb [name] 06/29/2022, 2:50 PM

## 2022-06-29 NOTE — TOC Progression Note (Signed)
Transition of Care [facility]) - Progression Note    Patient Details  Name: Caleb [name] MRN: [id number] Date of Birth: 17-Oct-1950  [facility]) CM/SW Contact  Eduard Roux, LCSW Phone Number: 06/29/2022, 8:33 AM  Clinical Narrative:     CSW met with patient. CSW introduced self and explained role. CSW discussed with patient recommendations for short term rehab at [facility]. Patient states he lives alone and is agrees with recommendation. No preferred SNF. CSW explained the SNF process. All questions answered.   TOC will provide bed offers once available.  Antony Blackbird, MSW, LCSW Clinical Social Worker    Expected Discharge Plan: Skilled Nursing Facility Barriers to Discharge: Continued Medical Work up, SNF Pending bed offer  Expected Discharge Plan and Services In-house Referral: Clinical Social Work Discharge Planning Services: CM Consult Post Acute Care Choice: Home Health                                         Social Determinants of Health (SDOH) Interventions SDOH Screenings   Food Insecurity: No Food Insecurity (06/11/2022)  Housing: Low Risk  (06/11/2022)  Transportation Needs: No Transportation Needs (06/11/2022)  Utilities: Not At Risk (06/11/2022)  Alcohol Screen: Low Risk  (10/29/2021)  Depression (PHQ2-9): Low Risk  (05/02/2022)  Financial Resource Strain: Low Risk  (10/29/2021)  Physical Activity: Sufficiently Active (12/06/2020)  Social Connections: Moderately Isolated (12/06/2020)  Stress: No Stress Concern Present (12/06/2020)  Tobacco Use: Low Risk  (06/27/2022)    Readmission Risk Interventions     No data to display

## 2022-06-29 NOTE — Plan of Care (Signed)
  Problem: Education: Goal: Knowledge of General Education information will improve Description: Including pain rating scale, medication(s)/side effects and non-pharmacologic comfort measures Outcome: Progressing   Problem: Health Behavior/Discharge Planning: Goal: Ability to manage health-related needs will improve Outcome: Progressing   Problem: Clinical Measurements: Goal: Ability to maintain clinical measurements within normal limits will improve Outcome: Progressing Goal: Will remain free from infection Outcome: Progressing   Problem: Activity: Goal: Risk for activity intolerance will decrease Outcome: Progressing   Problem: Pain Managment: Goal: General experience of comfort will improve Outcome: Progressing   

## 2022-06-29 NOTE — TOC Progression Note (Addendum)
Transition of Care [facility]) - Progression Note    Patient Details  Name: Caleb [name] MRN: [id number] Date of Birth: 23-Nov-1950  Transition of Care [facility]) CM/SW Contact  Caleb [name], [place] Phone Number: 06/29/2022, 4:11 PM  Clinical Narrative:     Met with patient at bedside. He selected bed offer- [facility]- patient states once he seen CSW, he remembers talking with CSW.  CSW sent message to [facility] to confirm availability, waiting on response.   Caleb [name], MSW, LCSW Clinical Social Worker     Expected Discharge Plan: Skilled Nursing Facility Barriers to Discharge: Continued Medical Work up, SNF Pending bed offer  Expected Discharge Plan and Services In-house Referral: Clinical Social Work Discharge Planning Services: CM Consult Post Acute Care Choice: Home Health                                         Social Determinants of Health (SDOH) Interventions SDOH Screenings   Food Insecurity: No Food Insecurity (06/11/2022)  Housing: Low Risk  (06/11/2022)  Transportation Needs: No Transportation Needs (06/11/2022)  Utilities: Not At Risk (06/11/2022)  Alcohol Screen: Low Risk  (10/29/2021)  Depression (PHQ2-9): Low Risk  (05/02/2022)  Financial Resource Strain: Low Risk  (10/29/2021)  Physical Activity: Sufficiently Active (12/06/2020)  Social Connections: Moderately Isolated (12/06/2020)  Stress: No Stress Concern Present (12/06/2020)  Tobacco Use: Low Risk  (06/27/2022)    Readmission Risk Interventions     No data to display

## 2022-06-30 ENCOUNTER — Ambulatory Visit: Payer: Self-pay

## 2022-06-30 DIAGNOSIS — N179 Acute kidney failure, unspecified: Secondary | ICD-10-CM | POA: Diagnosis not present

## 2022-06-30 DIAGNOSIS — E1140 Type 2 diabetes mellitus with diabetic neuropathy, unspecified: Secondary | ICD-10-CM | POA: Diagnosis not present

## 2022-06-30 DIAGNOSIS — S99929A Unspecified injury of unspecified foot, initial encounter: Secondary | ICD-10-CM | POA: Diagnosis not present

## 2022-06-30 DIAGNOSIS — Z7401 Bed confinement status: Secondary | ICD-10-CM | POA: Diagnosis not present

## 2022-06-30 DIAGNOSIS — I5020 Unspecified systolic (congestive) heart failure: Secondary | ICD-10-CM | POA: Diagnosis not present

## 2022-06-30 DIAGNOSIS — R2689 Other abnormalities of gait and mobility: Secondary | ICD-10-CM | POA: Diagnosis not present

## 2022-06-30 DIAGNOSIS — R2681 Unsteadiness on feet: Secondary | ICD-10-CM | POA: Diagnosis not present

## 2022-06-30 DIAGNOSIS — M86171 Other acute osteomyelitis, right ankle and foot: Secondary | ICD-10-CM | POA: Diagnosis not present

## 2022-06-30 DIAGNOSIS — A419 Sepsis, unspecified organism: Secondary | ICD-10-CM | POA: Diagnosis not present

## 2022-06-30 DIAGNOSIS — Z4789 Encounter for other orthopedic aftercare: Secondary | ICD-10-CM | POA: Diagnosis not present

## 2022-06-30 DIAGNOSIS — E785 Hyperlipidemia, unspecified: Secondary | ICD-10-CM | POA: Diagnosis not present

## 2022-06-30 DIAGNOSIS — I739 Peripheral vascular disease, unspecified: Secondary | ICD-10-CM | POA: Diagnosis not present

## 2022-06-30 DIAGNOSIS — I5022 Chronic systolic (congestive) heart failure: Secondary | ICD-10-CM | POA: Diagnosis not present

## 2022-06-30 DIAGNOSIS — N1831 Chronic kidney disease, stage 3a: Secondary | ICD-10-CM | POA: Diagnosis not present

## 2022-06-30 DIAGNOSIS — R531 Weakness: Secondary | ICD-10-CM | POA: Diagnosis not present

## 2022-06-30 DIAGNOSIS — M6281 Muscle weakness (generalized): Secondary | ICD-10-CM | POA: Diagnosis not present

## 2022-06-30 DIAGNOSIS — R41841 Cognitive communication deficit: Secondary | ICD-10-CM | POA: Diagnosis not present

## 2022-06-30 LAB — CBC
HCT: 28.3 % — ABNORMAL LOW (ref 39.0–52.0)
Hemoglobin: 9.3 g/dL — ABNORMAL LOW (ref 13.0–17.0)
MCH: 26.9 pg (ref 26.0–34.0)
MCHC: 32.9 g/dL (ref 30.0–36.0)
MCV: 81.8 fL (ref 80.0–100.0)
Platelets: 344 10*3/uL (ref 150–400)
RBC: 3.46 MIL/uL — ABNORMAL LOW (ref 4.22–5.81)
RDW: 16.8 % — ABNORMAL HIGH (ref 11.5–15.5)
WBC: 7.2 10*3/uL (ref 4.0–10.5)
nRBC: 0 % (ref 0.0–0.2)

## 2022-06-30 LAB — GLUCOSE, CAPILLARY
Glucose-Capillary: 160 mg/dL — ABNORMAL HIGH (ref 70–99)
Glucose-Capillary: 184 mg/dL — ABNORMAL HIGH (ref 70–99)
Glucose-Capillary: 166 mg/dL — ABNORMAL HIGH (ref 70–99)

## 2022-06-30 MED ORDER — HYDRALAZINE HCL 10 MG PO TABS: 10.0000 mg | ORAL_TABLET | Freq: Three times a day (TID) | ORAL | 0 refills | Status: DC

## 2022-06-30 MED ORDER — HYDROCODONE-ACETAMINOPHEN 5-325 MG PO TABS: 1.0000 | ORAL_TABLET | Freq: Four times a day (QID) | ORAL | 0 refills | Status: DC | PRN

## 2022-06-30 MED ORDER — APIXABAN 5 MG PO TABS: 5.0000 mg | ORAL_TABLET | Freq: Two times a day (BID) | ORAL | 0 refills | Status: DC

## 2022-06-30 MED ORDER — AMOXICILLIN-POT CLAVULANATE 875-125 MG PO TABS: 1.0000 | ORAL_TABLET | Freq: Two times a day (BID) | ORAL | 0 refills | Status: AC

## 2022-06-30 NOTE — Progress Notes (Addendum)
[facility]  Subjective  - No change, no new complaints   Objective 130/79 85 98.8 F (37.1 C) (Oral) 16 99%  Intake/Output Summary (Last 24 hours) at 06/30/2022 0718 Last data filed at 06/30/2022 0249 Gross per 24 hour  Intake 240 ml  Output 500 ml  Net -260 ml   Doppler AT signals on the  right LE Vac with good seal right Heel Right groin soft without hematoma Lungs non labored breathing Right LE ace wrap applied from toes to thigh at bedside, minimal proximal    Assessment/Planning: POD # 12  s/p: right CFA endarterectomy with femoral to ATA bypass  Patent bypass with AT signal Per Dr. Lajoyce Corners Cultures are showing gram-positive cocci. Patient may discharge to home on antibiotic coverage once cultures are finalized. I will follow-up in the office 1 week after discharge. Patient will transition to the [facility] plus portable wound VAC pump for discharge.    Continue heparin, will need DOAC at discharge. Continue aspirin and statin   No change in exam or medications since yesterday I will order ted hose thigh high this could be used or ace wraps for mild compression to promote healing of the incisions. F/U arranged in 2-3 weeks for incisional checks. Dr. Lajoyce Corners has planned to f/u in 1 week for exam and vac should be maintained until his f/u visit per DR. Duda's instructions.    Mosetta Pigeon 06/30/2022 7:18 AM  VASCULAR STAFF ADDENDUM: I have independently interviewed and examined the patient. I agree with the above.  Will plan to see in clinic for staple removal, bypass insonation.  ASA, DOAC  Fara Olden, MD [facility] Phone Number: [phone] 06/30/2022 12:30 PM   --  Laboratory Lab Results: Recent Labs    06/29/22 0126 06/30/22 0159  WBC 8.2 7.2  HGB 9.9* 9.3*  HCT 29.7* 28.3*  PLT 380 344   BMET Recent Labs    06/28/22 0255  NA 132*  K 3.8  CL 99  CO2 27  GLUCOSE  151*  BUN 47*  CREATININE 1.44*  CALCIUM 8.2*    COAG Lab Results  Component Value Date   INR 1.0 01/11/2022   INR 0.9 01/30/2020   INR 1.0 12/11/2019   No results found for: "PTT"

## 2022-06-30 NOTE — Progress Notes (Signed)
Report given to the receiving Nurse of [name] questions were answered

## 2022-06-30 NOTE — Discharge Summary (Signed)
Physician Discharge Summary  Caleb LangoJames R Shishido FIE:[id number]:[id number] DOB: 06/19/1950 DOA: 06/07/2022  PCP: Hoy RegisterNewlin, Enobong, MD  Admit date: 06/07/2022 Discharge date: 06/30/2022 30 Day Unplanned Readmission Risk Score    Flowsheet Row ED to Hosp-Ad[facility] (Current[facility]  30 Day Unplanned Readmission Risk Score (%) 49.19 Filed at 06/30/2022 1200       This score is the patient's risk of an unplanned readmission within 30 days of being discharged (0 -100%). The score is based on dignosis, age, lab data, medications, orders, and past utilization.   Low:  0-14.9   Medium: 15-21.9   High: 22-29.9   Extreme: 30 and above          Admitted From: Home Disposition: SNF  Recommendations for Outpatient Follow-up:  Follow up with PCP in 1-2 weeks Please obtain BMP/CBC in one week Follow-up with vascular surgery for staple removal within 1 week. Follow-up with orthopedic surgery/Dr. Lajoyce Cornersuda in 1 to 2 weeks. Please follow up with your PCP on the following pending results: Unresulted Labs (From admission, onward)     Start     Ordered   06/26/22 0500  CBC  Daily,   R     Question:  Specimen collection method  Answer:  Lab=Lab collect   06/25/22 0944              Home Health: None Equipment/Devices: None  Discharge Condition: Stable CODE STATUS: Full code Diet recommendation: Cardiac  Subjective: Seen and went.  No complaints.  He is looking forward to going to rehab.  Brief/Interim Summary: Caleb [name] is a 72 year old man with PVD, DM 2, HTN, AAS s/p TAVR, HFrEF with a EF of 35% send CKD 3 was admitted with recurrent diabetic foot infection and acute on chronic kidney injury. Workup revealed acute osteomyelitis in the posterolateral aspect of the calcaneus. Seen by ID and per their recommendations, he was not started on doxycycline/Augmentin and even to be seen by vascular surgery and orthopedics.  Details below.   Sepsis secondary to  acute osteomyelitis right heel and PVD, POA: -Antibiotics per ID, continue Augmentin.  Plan for 4 wks post op abx (end date 07/22/2022) and ID follow up (has an appointment on 07/12/2022 at [facility] with Dr. Elinor ParkinsonManandhar). -Aspirin,  lipitor, his Plavix was discontinued per vascular surgery recommendations, now that he is on Eliquis. -Status post angiogram 3/25.  Status post femoral-tibial bypass.  Eliquis per vascular surgery recommendations. -s/p R partial calcaneus excision, excision of skin and soft tissue R heel  -surgical cultures (rare gram positive cocci -> GBS among mixed organisms)     AKI on CKD stage IIIb, resolved -Baseline Cr 1.5-2.5 -Appreciate nephrology -Thought to be secondary to hypovolemia while on Entresto.  Sherryll Burgerntresto was resumed on 06/13/2022.  He initially seemed intermittent IV Lasix.  Scheduled oral Lasix resumed on 06/20/2022 -> increase to 80 mg BID, but now resuming on home dose of Lasix, more like his home regimen (80/40). -Nephrology signed off 3/22.  Follow-up with Dr. Marisue HumbleSanford later this month  HFrEF Exacerbation  Volume Overload/essential hypertension Stable.  Continue entresto Hydralazine, imdur  BP on the lower side.  Hydralazine decreased to 10 mg p.o. 3 times daily on 06/28/2022.   Hyponatremia Mild, follow with diuresis   SOB - CXR without active disease, no complaints today   Hyperkalemia -Resolved   Diabetes mellitus type 2 with hyperglycemia Hold home 70/30 while inpatient.  Continue basal and mealtime with SSI.  Blood  sugar controlled.  Discharge plan was discussed with patient and/or family member and they verbalized understanding and agreed with it.  Discharge Diagnoses:  Principal Problem:   Acute osteomyelitis of right calcaneus Active Problems:   Type 2 diabetes mellitus with hyperlipidemia   Acute kidney injury superimposed on chronic kidney disease   HTN (hypertension)   Hyperlipidemia   Hyponatremia   Dyslipidemia   PAD (peripheral artery  disease)   S/P TAVR (transcatheter aortic valve replacement)   Decubitus ulcer of right heel   Diabetic foot ulcer   Gastroenteritis    Discharge Instructions  Discharge Instructions     Negative Pressure Wound Therapy - Incisional   Complete by: As directed    Discharge with the Praveena plus portable wound VAC pump.  Show patient how to plug this in to keep it charged.   Touch down weight bearing   Complete by: As directed    Touchdown weightbearing on the right.  Patient is to wear the [facility] boot 24 hours a day.   Laterality: right   Extremity: Lower      Allergies as of 06/30/2022       Reactions   Codeine Nausea And Vomiting   Norco [hydrocodone-acetaminophen] Nausea And Vomiting   Percocet [oxycodone-acetaminophen] Nausea And Vomiting   Tramadol Hcl Nausea And Vomiting        Medication List     STOP taking these medications    clopidogrel 75 MG tablet Commonly known as: PLAVIX       TAKE these medications    amoxicillin-clavulanate 875-125 MG tablet Commonly known as: AUGMENTIN Take 1 tablet by mouth 2 (two) times daily for 23 days.   apixaban 5 MG Tabs tablet Commonly known as: ELIQUIS Take 1 tablet (5 mg total) by mouth 2 (two) times daily.   Artificial Tears PF 0.1-0.3 % Soln Generic drug: Dextran 70-Hypromellose (PF) Place 1 drop into both eyes every 8 (eight) hours as needed (for dryness).   aspirin EC 81 MG tablet Take 81 mg by mouth in the morning.   atorvastatin 40 MG tablet Commonly known as: LIPITOR Take 40 mg by mouth in the morning.   BETADINE ANTISEPTIC EX Apply 1 application  topically See admin instructions. Use as directed every other day for wound care (right heel site)   carvedilol 12.5 MG tablet Commonly known as: COREG Take 1 tablet (12.5 mg total) by mouth 2 (two) times daily with a meal.   cyanocobalamin 500 MCG tablet Commonly known as: VITAMIN B12 Take 500 mcg by mouth in the morning.   Entresto 49-51  MG Generic drug: sacubitril-valsartan Take 1 tablet by mouth 2 (two) times daily.   ferrous sulfate 325 (65 FE) MG tablet Take 325 mg by mouth daily with breakfast.   FreeStyle Libre 14 Day Sensor Misc Inject 1 Device into the skin every 14 (fourteen) days.   furosemide 40 MG tablet Commonly known as: LASIX Take 40 mg by mouth daily at 4 PM. What changed: Another medication with the same name was changed. Make sure you understand how and when to take each.   furosemide 80 MG tablet Commonly known as: LASIX Take 1 tablet (80 mg total) by mouth daily. What changed: when to take this   gabapentin 800 MG tablet Commonly known as: NEURONTIN Take 800 mg by mouth at bedtime as needed (Sleep).   hydrALAZINE 10 MG tablet Commonly known as: APRESOLINE Take 1 tablet (10 mg total) by mouth 3 (three) times daily. What changed:  medication strength how much to take   HYDROcodone-acetaminophen 5-325 MG tablet Commonly known as: NORCO/VICODIN Take 1 tablet by mouth every 6 (six) hours as needed for moderate pain.   insulin NPH-regular Human (70-30) 100 UNIT/ML injection Inject 7-8 Units into the skin See admin instructions. Inject 8 units into the skin before breakfast and 7 units before the evening meal   isosorbide mononitrate 30 MG 24 hr tablet Commonly known as: IMDUR Take 1 tablet (30 mg total) by mouth daily.   NON FORMULARY Take 16 oz by mouth See admin instructions. [place] Signature Organic Raw Kombucha, Ginger Lemonade- Drink 16 ounces by mouth once a day   polyethylene glycol 17 g packet Commonly known as: MIRALAX / GLYCOLAX Take 17 g by mouth daily. What changed:  when to take this reasons to take this   PRESCRIPTION MEDICATION Inject 0.1-1 mLs as directed See admin instructions. Tri-Mix Standard Strength 5 ml. Formula: Prostaglandin 10 mcg/ml, Papaverine 30 mg/ml, Phentolamine 1 mg/ml. Inject 0.1 ml into side of penis as directed as needed (to be injected  immediately before sexual intercourse) may increase the dose by 0.1 ml every 48 hours to achieve an erection. Max dose 1 ml.   Tylenol 325 MG Caps Generic drug: Acetaminophen Take 650 mg by mouth every 6 (six) hours as needed (for pain).   Vitamin D-3 25 MCG (1000 UT) Caps Take 1,000 Units by mouth daily.               Discharge Care Instructions  (From admission, onward)           Start     Ordered   06/25/22 0000  Touch down weight bearing       Comments: Touchdown weightbearing on the right.  Patient is to wear the [facility] boot 24 hours a day.  Question Answer Comment  Laterality right   Extremity Lower      06/25/22 0942            Follow-up Information     Arita Miss, MD Follow up in 1 month(s).   Specialty: Nephrology Why: We will call with appointment details Contact information: [address] [place] [address] [phone]         Nadara Mustard, MD Follow up in 1 week(s).   Specialty: Orthopedic Surgery Contact information: [address] [place] [address] [phone]         Victorino Sparrow, MD Follow up in 2 week(s).   Specialty: Vascular Surgery Why: Office will call you to arrange your appt (sent) Contact information: [address] [place] [address] [phone]         Hoy Register, MD Follow up in 1 week(s).   Specialty: Family Medicine Contact information: [address] [place] [address] [phone]                Allergies  Allergen Reactions   Codeine Nausea And Vomiting   Norco [Hydrocodone-Acetaminophen] Nausea And Vomiting   Percocet [Oxycodone-Acetaminophen] Nausea And Vomiting   Tramadol Hcl Nausea And Vomiting    Consultations: Vascular surgery and orthopedics   Procedures/Studies: DG CHEST PORT 1 VIEW  Result Date: 06/23/2022 CLINICAL DATA:  Shortness of breath, now improved EXAM: PORTABLE CHEST 1 VIEW COMPARISON:  Chest radiograph dated 05/29/2022  FINDINGS: Normal lung volumes. No focal consolidations. No pleural effusion or pneumothorax. Similar mildly enlarged cardiomediastinal silhouette status post valve replacement. The visualized skeletal structures are unremarkable. IMPRESSION: No active disease. Electronically  Signed   By: Agustin Cree M.D.   On: 06/23/2022 19:51   HYBRID OR IMAGING ([facility] ONLY)  Result Date: 06/17/2022 There is no interpretation for this exam.  This order is for images obtained during a surgical procedure.  Please See "Surgeries" Tab for more information regarding the procedure.   DG Abd 2 Views  Result Date: 06/15/2022 CLINICAL DATA:  Vomiting. EXAM: ABDOMEN - 2 VIEW COMPARISON:  None Available. FINDINGS: There is no significant bowel dilation to suggest obstruction. Multiple air-fluid levels are noted on the erect view, however. There is no free air. Soft tissues are poorly defined.  No gross soft tissue abnormality. IMPRESSION: 1. Multiple air-fluid levels on the erect view within nondistended bowel. Findings suspected to be a mild diffuse adynamic ileus or gastroenteritis. No convincing bowel obstruction. Electronically Signed   By: Amie Portland M.D.   On: 06/15/2022 18:33   VAS [place] LOWER EXTREMITY SAPHENOUS VEIN MAPPING  Result Date: 06/14/2022 LOWER EXTREMITY VEIN MAPPING Patient Name:  JAVARUS DORNER  Date of Exam:   06/14/2022 Medical Rec #: [id number]        Accession #:    [id number] Date of Birth: [birth year]         Patient Gender: M Patient Age:   66 years Exam Location:  [facility] Procedure:      VAS [place] LOWER EXTREMITY SAPHENOUS VEIN MAPPING Referring Phys: Coral Else --------------------------------------------------------------------------------  Indications:       Pre-op right lower extremity bypass Other Indications: PAD  Comparison Study: 06-13-2022 Abdominal aortogram w/ right lower extremity. Performing Technologist: Jean Rosenthal RDMS, RVT  Examination Guidelines: A complete evaluation includes  B-mode imaging, spectral Doppler, color Doppler, and power Doppler as needed of all accessible portions of each vessel. Bilateral testing is considered an integral part of a complete examination. Limited examinations for reoccurring indications may be performed as noted. +------------+------------------+---------------------+------------+-----------+ RT Diameter    RT Findings             GSV         LT Diameter LT Findings     (cm)                                               (cm)                +------------+------------------+---------------------+------------+-----------+     0.43                         Saphenofemoral        0.56                                                    Junction                               +------------+------------------+---------------------+------------+-----------+     0.38                         Proximal thigh        0.39                +------------+------------------+---------------------+------------+-----------+  0.28                            Mid thigh          0.40                +------------+------------------+---------------------+------------+-----------+     0.39                          Distal thigh         0.30                +------------+------------------+---------------------+------------+-----------+     0.31        branching             Knee             0.29     branching  +------------+------------------+---------------------+------------+-----------+     0.39                            Prox calf          0.24                +------------+------------------+---------------------+------------+-----------+     0.26    multiple branches       Mid calf           0.21     branching  +------------+------------------+---------------------+------------+-----------+     0.25        branching          Distal calf         0.26                 +------------+------------------+---------------------+------------+-----------+               bandaging- not          Ankle            0.28                                visualized                                                 +------------+------------------+---------------------+------------+-----------+ Diagnosing physician: Coral Else MD Electronically signed by Coral Else MD on 06/14/2022 at 7:19:35 PM.    Final    PERIPHERAL VASCULAR CATHETERIZATION  Result Date: 06/13/2022 Table formatting from the original result was not included. Images from the original result were not included. PATIENT: MAYKEL GELL      MRN: [id number] DOB: 08-05-1950    DATE OF PROCEDURE: 06/13/2022 INDICATIONS:  DAWUAN BENDOLPH is a 71 y.o. male with a nonhealing wound of the right foot and evidence of significant infrainguinal arterial occlusive disease.  He presents for arteriography. PROCEDURE:  Conscious sedation Ultrasound-guided access to the left common femoral artery CO2 aortogram and iliac arteriogram Selective catheterization of the right external iliac artery (second-order catheterization) with right lower extremity runoff Mynx closure of the left common femoral artery SURGEON: Di Kindle. Edilia Bo, MD, FACS ANESTHESIA: Local with sedation EBL: Minimal TOTAL CONTRAST: 15 ml TECHNIQUE: The patient was brought to the peripheral vascular lab and was sedated. The period of conscious sedation was 47 minutes.  During that time period, I was present face-to-face 100% of the time.  The patient was administered initially 1 mg of Versed and 50 mcg of fentanyl. The patient's heart rate, blood pressure, and oxygen saturation were monitored by the nurse continuously during the procedure. Both groins were prepped and draped in the usual sterile fashion.  Under ultrasound guidance, after the skin was anesthetized, I cannulated the left common femoral artery with a micropuncture needle and a micropuncture sheath  was introduced over a wire.  This was exchanged for a 5 [place] sheath over a Bentson wire.  By ultrasound the femoral artery was patent. A real-time image was obtained and sent to the server. A pigtail catheter was positioned at the L1 vertebral body and CO2 aortogram obtained.  The catheter was in position above the aortic bifurcation and an oblique iliac projection was obtained.  Of note the patient had significant bowel gas.  I then exchanged the pigtail catheter for a crossover catheter which was positioned into the right common iliac artery.  I advanced the Bentson wire into the external iliac artery and exchanged for a straight catheter.  Selective right external iliac arteriogram was obtained initially using CO2 and then using limited contrast. At the completion of the procedure the catheter was removed.  The left groin was closed with a Mynx device with no immediate complications noted. FINDINGS: No significant disease noted in the infrarenal aorta, bilateral common iliac arteries, bilateral external iliac arteries, or bilateral internal iliac arteries. This patient had history of renal insufficiency and limited contrast was used.  Only the right leg was studied.  On the right side there was a focal stenosis in the distal common femoral artery and proximal superficial femoral artery.  The right superficial femoral artery had moderate diffuse disease throughout and the popliteal artery was occluded just above the level of the knee.  There was reconstitution of the anterior tibial artery and peroneal artery only.  The posterior tibial artery was occluded.  There is some disease in the dorsalis pedis on the right. CLINICAL NOTE: The patient will be considered for right lower extremity bypass.  I will discuss with Dr. Myra Gianotti. Waverly Ferrari, MD, FACS [facility]   MR FOOT RIGHT WO CONTRAST  Result Date: 06/07/2022 CLINICAL DATA:  Increasing right heel pain from ulcer for  1 month. Diabetic peripheral neuropathy EXAM: MRI OF THE RIGHT FOOT WITHOUT CONTRAST TECHNIQUE: Multiplanar, multisequence MR imaging of the right hindfoot was performed. No intravenous contrast was administered. COMPARISON:  RADIOGRAPHS DATED June 07, 2022 FINDINGS: Bones/Joint/Cartilage There is cortical erosion and marrow edema of the posterolateral aspect of the calcaneus consistent with osteomyelitis. There is heterogeneous signal of the talar dome which is likely sequela of chronic osteochondral injury, likely a chronic process. No evidence of acute edema. Ligaments Ligaments of the lateral and medial compartment of the ankle appear intact. Muscles and Tendons Increased intramuscular signal of the flexor hallucis longus and plantar muscles of the foot suggesting diabetic myopathy/myositis. Soft tissues Deep skin wound with subcutaneous edema about the posterolateral aspect of the heel suggesting chronic diabetic ulcer. No fluid collection or abscess. Mild generalized subcutaneous soft tissue edema about the ankle and foot. IMPRESSION: 1. Acute osteomyelitis of the posterolateral aspect of the calcaneus. 2. Increased intramuscular signal of the flexor hallucis longus and plantar muscles of the foot suggesting diabetic myopathy/myositis. 3. Deep skin wound with subcutaneous edema about the posterolateral aspect of the heel suggesting chronic diabetic ulcer. No  fluid collection or abscess. Electronically Signed   By: Larose Hires D.O.   On: 06/07/2022 22:05   DG Foot Complete Right  Result Date: 06/07/2022 CLINICAL DATA:  Diabetic foot ulcer EXAM: RIGHT FOOT COMPLETE - 3+ VIEW COMPARISON:  05/12/2022 FINDINGS: Deep soft tissue ulceration at the posterior aspect of the right heel. No erosion or periosteal elevation is evident of the underlying posterior calcaneus. No acute fracture or dislocation. Small plantar calcaneal spur. Similar degree of mild degenerative changes. Atherosclerotic vascular  calcifications. No abnormally tracking soft tissue gas is seen. IMPRESSION: Deep soft tissue ulceration at the posterior aspect of the right heel. No radiographic evidence of osteomyelitis. Electronically Signed   By: Duanne Guess D.O.   On: 06/07/2022 11:58     Discharge Exam: Vitals:   06/30/22 1024 06/30/22 1105  BP: 128/77 119/70  Pulse: 93 88  Resp:  19  Temp: 98.4 F (36.9 C) 98.1 F (36.7 C)  SpO2: 98% 92%   Vitals:   06/30/22 0500 06/30/22 0825 06/30/22 1024 06/30/22 1105  BP:  127/82 128/77 119/70  Pulse:  78 93 88  Resp:  18  19  Temp:  98.1 F (36.7 C) 98.4 F (36.9 C) 98.1 F (36.7 C)  TempSrc:  Oral Oral Oral  SpO2:  99% 98% 92%  Weight: 85.4 kg     Height:        General: Pt is alert, awake, not in acute distress Cardiovascular: RRR, S1/S2 +, no rubs, no gallops Respiratory: CTA bilaterally, no wheezing, no rhonchi Abdominal: Soft, NT, ND, bowel sounds +     The results of significant diagnostics from this hospitalization (including imaging, microbiology, ancillary and laboratory) are listed below for reference.     Microbiology: Recent Results (from the past 240 hour(s))  Aerobic/Anaerobic Culture w Gram Stain (surgical/deep wound)     Status: None   Collection Time: 06/24/22  8:55 AM   Specimen: Soft Tissue, Other  Result Value Ref Range Status   Specimen Description TISSUE  Final   Special Requests  CALCANEUS  Final   Gram Stain NO WBC SEEN RARE GRAM POSITIVE COCCI   Final   Culture   Final    ABUNDANT GROUP B STREP(S.AGALACTIAE)ISOLATED TESTING AGAINST S. AGALACTIAE NOT ROUTINELY PERFORMED DUE TO PREDICTABILITY OF AMP/PEN/VAN SUSCEPTIBILITY. AMONG MIXED ORGANISMS NO ANAEROBES ISOLATED Performed at [facility], [address], [place], [place] [address]    Report Status 06/29/2022 FINAL  Final     Labs: BNP (last 3 results) Recent Labs    10/27/21 1707 03/07/22 1145 05/27/22 1513  BNP 1,165.9* 728.7* >4,500.0*   Basic  Metabolic Panel: Recent Labs  Lab 06/25/22 0706 06/25/22 1214 06/26/22 0118 06/27/22 0104 06/28/22 0255  NA 132* 131* 129* 131* 132*  K 5.0 4.2 4.3 4.5 3.8  CL 98 96* 98 98 99  CO2 23 25 25 28 27   GLUCOSE 150* 152* 184* 132* 151*  BUN 32* 35* 42* 43* 47*  CREATININE 1.65* 1.61* 1.48* 1.51* 1.44*  CALCIUM 8.5* 8.5* 7.9* 8.0* 8.2*  MG 1.7  --  1.7 1.8 1.8  PHOS 3.8  --  3.4 3.6 4.4   Liver Function Tests: Recent Labs  Lab 06/25/22 0706 06/26/22 0118  AST 16 15  ALT 11 11  ALKPHOS 91 90  BILITOT 0.6 0.5  PROT 6.7 6.2*  ALBUMIN 2.3* 2.0*   No results for input(s): "LIPASE", "AMYLASE" in the last 168 hours. No results for input(s): "AMMONIA" in the  last 168 hours. CBC: Recent Labs  Lab 06/25/22 0706 06/25/22 1214 06/26/22 0118 06/27/22 0104 06/28/22 0255 06/29/22 0126 06/30/22 0159  WBC 12.8*   < > 10.9* 8.7 7.8 8.2 7.2  NEUTROABS 9.7*  --   --   --   --   --   --   HGB 10.5*   < > 9.5* 8.9* 9.0* 9.9* 9.3*  HCT 32.1*   < > 28.6* 28.2* 27.3* 29.7* 28.3*  MCV 81.5   < > 81.3 83.2 81.3 80.7 81.8  PLT 352   < > 295 312 343 380 344   < > = values in this interval not displayed.   Cardiac Enzymes: No results for input(s): "CKTOTAL", "CKMB", "CKMBINDEX", "TROPONINI" in the last 168 hours. BNP: Invalid input(s): "POCBNP" CBG: Recent Labs  Lab 06/29/22 1224 06/29/22 1631 06/29/22 2128 06/30/22 0604 06/30/22 1106  GLUCAP 119* 109* 204* 160* 184*   D-Dimer No results for input(s): "DDIMER" in the last 72 hours. Hgb A1c No results for input(s): "HGBA1C" in the last 72 hours. Lipid Profile No results for input(s): "CHOL", "HDL", "LDLCALC", "TRIG", "CHOLHDL", "LDLDIRECT" in the last 72 hours. Thyroid function studies No results for input(s): "TSH", "T4TOTAL", "T3FREE", "THYROIDAB" in the last 72 hours.  Invalid input(s): "FREET3" Anemia work up No results for input(s): "VITAMINB12", "FOLATE", "FERRITIN", "TIBC", "IRON", "RETICCTPCT" in the last 72  hours. Urinalysis    Component Value Date/Time   COLORURINE YELLOW 06/08/2022 1432   APPEARANCEUR HAZY (A) 06/08/2022 1432   LABSPEC 1.017 06/08/2022 1432   PHURINE 5.0 06/08/2022 1432   GLUCOSEU 50 (A) 06/08/2022 1432   HGBUR NEGATIVE 06/08/2022 1432   BILIRUBINUR NEGATIVE 06/08/2022 1432   BILIRUBINUR neg 06/09/2015 1553   KETONESUR 5 (A) 06/08/2022 1432   PROTEINUR 30 (A) 06/08/2022 1432   UROBILINOGEN 0.2 06/09/2015 1553   UROBILINOGEN 0.2 08/12/2012 1613   NITRITE NEGATIVE 06/08/2022 1432   LEUKOCYTESUR NEGATIVE 06/08/2022 1432   Sepsis Labs Recent Labs  Lab 06/27/22 0104 06/28/22 0255 06/29/22 0126 06/30/22 0159  WBC 8.7 7.8 8.2 7.2   Microbiology Recent Results (from the past 240 hour(s))  Aerobic/Anaerobic Culture w Gram Stain (surgical/deep wound)     Status: None   Collection Time: 06/24/22  8:55 AM   Specimen: Soft Tissue, Other  Result Value Ref Range Status   Specimen Description TISSUE  Final   Special Requests  CALCANEUS  Final   Gram Stain NO WBC SEEN RARE GRAM POSITIVE COCCI   Final   Culture   Final    ABUNDANT GROUP B STREP(S.AGALACTIAE)ISOLATED TESTING AGAINST S. AGALACTIAE NOT ROUTINELY PERFORMED DUE TO PREDICTABILITY OF AMP/PEN/VAN SUSCEPTIBILITY. AMONG MIXED ORGANISMS NO ANAEROBES ISOLATED Performed at [facility], [address], [place], [place] [address]    Report Status 06/29/2022 FINAL  Final     Time coordinating discharge: Over 30 minutes  SIGNED:   Hughie Closs, MD  [facility] 06/30/2022, 2:26 PM *Please note that this is a verbal dictation therefore any spelling or grammatical errors are due to the "Dragon Medical One" system interpretation. If 7PM-7AM, please contact night-coverage [email or web address]

## 2022-06-30 NOTE — Progress Notes (Signed)
Physical Therapy Treatment Patient Details Name: Caleb [name] MRN: [id number] DOB: [birth year] Today's Date: 06/30/2022   History of Present Illness 72 y.o. male admitted 3/19 with Rt calcaneal osteomyelitis. RLE angiogram 3/25. 3/29 Rt SFA to anterior tib BPG with Rt SFA endarterectomy, 4/5 excision of necrotic heel ulcer and partial calcaneal excision. PMH: IDDM, GERD, HTN, HLD, hyponatremia, chronic systolic CHF with EF 35-40%, TAVR, PVD, neuropathy.    PT Comments    Pt received in supine, agreeable to therapy session and with good participation and tolerance for transfer training and seated UE/LE exercises for strengthening. Pt needing up to modA for pre-gait tasks/pivot from bed>chair with assist needed to keep RLE NWB (pt unable to maintain TDWB unassisted and is safer to keep this leg NWB) and min to modA for squatting and sit<>stand from elevated surface to RW. Pt continues to benefit from PT services to progress toward functional mobility goals, pt with good effort for all tasks and remains a good candidate for short term low to moderate intensity post-acute rehab.  Recommendations for follow up therapy are one component of a multi-disciplinary discharge planning process, led by the attending physician.  Recommendations may be updated based on patient status, additional functional criteria and insurance authorization.  Follow Up Recommendations  Can patient physically be transported by private vehicle: No    Assistance Recommended at Discharge Intermittent Supervision/Assistance  Patient can return home with the following Assistance with cooking/housework;Assist for transportation;Help with stairs or ramp for entrance;A lot of help with walking and/or transfers;A lot of help with bathing/dressing/bathroom   Equipment Recommendations  Wheelchair (measurements PT);Wheelchair cushion (measurements PT);BSC/3in1 (pt may refuse BSC, needs further education)    Recommendations for Other  Services       Precautions / Restrictions Precautions Precautions: Fall Precaution Comments: RLE pain, wound vac Restrictions Weight Bearing Restrictions: Yes RLE Weight Bearing: Touchdown weight bearing ("he can weight bear on the ball of his foot;NO weight on heel" per MD Lajoyce Corners 4/10) Other Position/Activity Restrictions: Prafo on at all times     Mobility  Bed Mobility Overal bed mobility: Needs Assistance Bed Mobility: Supine to Sit     Supine to sit: Supervision, HOB elevated     General bed mobility comments: use of bed features/rails to perform, increased time.    Transfers Overall transfer level: Needs assistance Equipment used: Rolling walker (2 wheels) Transfers: Sit to/from Stand, Bed to chair/wheelchair/BSC Sit to Stand: Mod assist   Step pivot transfers: Mod assist, From elevated surface       General transfer comment: squatting at EOB with min guard, but pt ultimately needed modA for sit>stand from EOB>RW and PTA assisting him manually to keep RLE elevated from floor, he was unable to maintain RLE TDWB without PTA assist while hopping on LLE. Pt then performs squatting from recliner chair x10 reps with up to minA but too fatigued for second gait trial.    Ambulation/Gait Ambulation/Gait assistance: Mod assist Gait Distance (Feet): 6 Feet Assistive device: Rolling walker (2 wheels) Gait Pattern/deviations: Shuffle ("hop-to" via shuffle on LLE and PTA holding RLE elevated off floor due to pt difficulty maintaining RLE TDWB)           Stairs         General stair comments: pt not currently able given RLE TDWB precs   Wheelchair Mobility    Modified Rankin (Stroke Patients Only)       Balance Overall balance assessment: Needs assistance Sitting-balance support: Feet supported Sitting balance-Leahy Scale:  Good     Standing balance support: During functional activity, Reliant on assistive device for balance, Bilateral upper extremity  supported Standing balance-Leahy Scale: Poor Standing balance comment: bil UE on RW to maintain TDWB                            Cognition Arousal/Alertness: Awake/alert Behavior During Therapy: WFL for tasks assessed/performed Overall Cognitive Status: Within Functional Limits for tasks assessed                                 General Comments: Pt benefits from multimodal cues and demo prior to attempting new exercises/tasks. Difficulty maintaining RLE TDWB at times so PTA assisting to keep RLE elevated during transfers        Exercises General Exercises - Lower Extremity Long Arc Quad: AROM, Both, 10 reps, Seated (gentle graded LLE resistance and HS curls) Other Exercises Other Exercises: Marching in place 1x5 reps RLE (TTWB) Other Exercises: squatting from chair x10 reps (B elbows full extension) maintaining RLE NWB throughout (rest break after initial 5 reps and PRN)    General Comments General comments (skin integrity, edema, etc.): HR to 106 bpm with transfer from bed>chair and 90's bpm resting. SpO2 97-98% on RA. BP 136/97 (110) prior to OOB and no acute s/sx distress with transfers. Wound vac appears intact with no errors, charger plugged in at end of session. Pt has LLE knee-high TED hose donned and RLE ace wrapped throughout no visible drainage.      Pertinent Vitals/Pain Pain Assessment Pain Assessment: 0-10 Pain Score: 5  Pain Location: RLE with dependent position and TDWB Pain Descriptors / Indicators: Grimacing, Discomfort Pain Intervention(s): Monitored during session, Limited activity within patient's tolerance, Repositioned, Premedicated before session     PT Goals (current goals can now be found in the care plan section) Acute Rehab PT Goals Patient Stated Goal: to get stronger so I can move to [place] where my daughters live and to be more independent PT Goal Formulation: With patient Time For Goal Achievement: 07/11/22 Progress towards PT  goals: Progressing toward goals    Frequency    Min 1X/week      PT Plan Current plan remains appropriate       AM-PAC PT "6 Clicks" Mobility   Outcome Measure  Help needed turning from your back to your side while in a flat bed without using bedrails?: A Little Help needed moving from lying on your back to sitting on the side of a flat bed without using bedrails?: A Little Help needed moving to and from a bed to a chair (including a wheelchair)?: A Little Help needed standing up from a chair using your arms (e.g., wheelchair or bedside chair)?: A Little Help needed to walk in hospital room?: Total (<70ft) Help needed climbing 3-5 steps with a railing? : Total 6 Click Score: 14    End of Session Equipment Utilized During Treatment: Gait belt;Other (comment) (RLE PRAFO, LLE post-op shoe) Activity Tolerance: Patient tolerated treatment well;Patient limited by fatigue Patient left: in chair;with call bell/phone within reach;Other (comment) (reclined, pillow to float heels, call bell in his lap) Nurse Communication: Mobility status;Other (comment) (needs bed linens changed) PT Visit Diagnosis: Pain;Other abnormalities of gait and mobility (R26.89);Muscle weakness (generalized) (M62.81) Pain - Right/Left: Right Pain - part of body: Leg     Time: 2563-8937 PT Time Calculation (min) (ACUTE  ONLY): 33 min  Charges:  $Therapeutic Exercise: 8-22 mins $Therapeutic Activity: 8-22 mins                     Huey Scalia P., PTA Acute Rehabilitation Services Secure Chat Preferred 9a-5:30pm Office: [phone]    Dorathy KinsmanCarly M [facility] 06/30/2022, 12:52 PM

## 2022-06-30 NOTE — TOC Progression Note (Addendum)
Transition of Care [facility]) - Progression Note    Patient Details  Name: Caleb [name] MRN: [id number] Date of Birth: 06-14-1950  [facility]) CM/SW Contact  Eduard Roux, [place] Phone Number: 06/30/2022, 1:15 PM  Clinical Narrative:    Update: received SNF approval #   [id number] 04/11-04/15  Sent updated clinicals as requested by insurance. Waiting on response   Expected Discharge Plan: Skilled Nursing Facility Barriers to Discharge: Insurance Authorization  Expected Discharge Plan and Services In-house Referral: Clinical Social Work Discharge Planning Services: CM Consult Post Acute Care Choice: Home Health                                         Social Determinants of Health (SDOH) Interventions SDOH Screenings   Food Insecurity: No Food Insecurity (06/11/2022)  Housing: Low Risk  (06/11/2022)  Transportation Needs: No Transportation Needs (06/11/2022)  Utilities: Not At Risk (06/11/2022)  Alcohol Screen: Low Risk  (10/29/2021)  Depression (PHQ2-9): Low Risk  (05/02/2022)  Financial Resource Strain: Low Risk  (10/29/2021)  Physical Activity: Sufficiently Active (12/06/2020)  Social Connections: Moderately Isolated (12/06/2020)  Stress: No Stress Concern Present (12/06/2020)  Tobacco Use: Low Risk  (06/27/2022)    Readmission Risk Interventions     No data to display

## 2022-06-30 NOTE — Patient Outreach (Signed)
  Care Coordination   Follow Up Visit Note   06/30/2022 Name: Caleb [name] MRN: [id number] DOB: 30-Dec-1950  Caleb [name] is a 72 y.o. year old male who sees Hoy Register, MD for primary care. I reviewed patient's chart today in preparation to contact patient for a nurse care coordination follow up.   What matters to the patients health and wellness today?  Patient remains to be inpatient.     Goals Addressed             This Visit's Progress    No complications from Decubitus ulcer of right heel       Care Coordination Interventions: Reviewed chart in preparation to contact patient for RN CC follow up  Noted patient remains to be inpatient at [facility], admit date: 06/07/22; dx: Acute osteomyelitis of right calcaneus         SDOH assessments and interventions completed:  No     Care Coordination Interventions:  Yes, provided   Follow up plan: Follow up call scheduled for 07/07/22 @11 :00 AM    Encounter Outcome:  Pt. Visit Completed

## 2022-06-30 NOTE — TOC Progression Note (Addendum)
Transition of Care [facility]) - Progression Note    Patient Details  Name: Caleb [name] MRN: [id number] Date of Birth: 1950/05/14  [facility] CM/SW Contact  Eduard Roux, [place] Phone Number: 06/30/2022, 10:32 AM  Clinical Narrative:     Updated insurance on the patient's SNF choice- [employer] remains pending at this time [facility] will continue to follow and assist with discharge planning.   ,Antony Blackbird, MSW, LCSW Clinical Social Worker    Expected Discharge Plan: Skilled Nursing Facility Barriers to Discharge: Insurance Authorization  Expected Discharge Plan and Services In-house Referral: Clinical Social Work Discharge Planning Services: CM Consult Post Acute Care Choice: Home Health                                         Social Determinants of Health (SDOH) Interventions SDOH Screenings   Food Insecurity: No Food Insecurity (06/11/2022)  Housing: Low Risk  (06/11/2022)  Transportation Needs: No Transportation Needs (06/11/2022)  Utilities: Not At Risk (06/11/2022)  Alcohol Screen: Low Risk  (10/29/2021)  Depression (PHQ2-9): Low Risk  (05/02/2022)  Financial Resource Strain: Low Risk  (10/29/2021)  Physical Activity: Sufficiently Active (12/06/2020)  Social Connections: Moderately Isolated (12/06/2020)  Stress: No Stress Concern Present (12/06/2020)  Tobacco Use: Low Risk  (06/27/2022)    Readmission Risk Interventions     No data to display

## 2022-06-30 NOTE — TOC Transition Note (Signed)
Transition of Care [facility]) - CM/SW Discharge Note   Patient Details  Name: Caleb [name] MRN: [id number] Date of Birth: 27-May-1950  Transition of Care [facility]) CM/SW Contact:  Eduard Roux, LCSW Phone Number: 06/30/2022, 3:03 PM   Clinical Narrative:     Patient will Discharge to: [facility] Discharge Date: 06/30/2022 Family Notified:  Transport By: Sharin Mons @ 5pm  Per MD patient is ready for discharge. RN, patient, and facility notified of discharge. Discharge Summary sent to facility. RN given number for [phone], Room 509. Ambulance transport requested for patient.   Clinical Social Worker signing off.  Antony Blackbird, MSW, LCSW Clinical Social Worker     Final next level of care: Skilled Nursing Facility Barriers to Discharge: Barriers Resolved   Patient Goals and CMS Choice      Discharge Placement                         Discharge Plan and Services Additional resources added to the After Visit Summary for   In-house Referral: Clinical Social Work Discharge Planning Services: CM Consult Post Acute Care Choice: Home Health                               Social Determinants of Health (SDOH) Interventions SDOH Screenings   Food Insecurity: No Food Insecurity (06/11/2022)  Housing: Low Risk  (06/11/2022)  Transportation Needs: No Transportation Needs (06/11/2022)  Utilities: Not At Risk (06/11/2022)  Alcohol Screen: Low Risk  (10/29/2021)  Depression (PHQ2-9): Low Risk  (05/02/2022)  Financial Resource Strain: Low Risk  (10/29/2021)  Physical Activity: Sufficiently Active (12/06/2020)  Social Connections: Moderately Isolated (12/06/2020)  Stress: No Stress Concern Present (12/06/2020)  Tobacco Use: Low Risk  (06/27/2022)     Readmission Risk Interventions     No data to display

## 2022-07-01 DIAGNOSIS — I5020 Unspecified systolic (congestive) heart failure: Secondary | ICD-10-CM | POA: Diagnosis not present

## 2022-07-01 DIAGNOSIS — E1140 Type 2 diabetes mellitus with diabetic neuropathy, unspecified: Secondary | ICD-10-CM | POA: Diagnosis not present

## 2022-07-01 DIAGNOSIS — A419 Sepsis, unspecified organism: Secondary | ICD-10-CM | POA: Diagnosis not present

## 2022-07-01 DIAGNOSIS — N179 Acute kidney failure, unspecified: Secondary | ICD-10-CM | POA: Diagnosis not present

## 2022-07-04 DIAGNOSIS — R2689 Other abnormalities of gait and mobility: Secondary | ICD-10-CM | POA: Diagnosis not present

## 2022-07-04 DIAGNOSIS — R2681 Unsteadiness on feet: Secondary | ICD-10-CM | POA: Diagnosis not present

## 2022-07-04 DIAGNOSIS — M86171 Other acute osteomyelitis, right ankle and foot: Secondary | ICD-10-CM | POA: Diagnosis not present

## 2022-07-04 DIAGNOSIS — N1831 Chronic kidney disease, stage 3a: Secondary | ICD-10-CM | POA: Diagnosis not present

## 2022-07-04 DIAGNOSIS — M6281 Muscle weakness (generalized): Secondary | ICD-10-CM | POA: Diagnosis not present

## 2022-07-04 DIAGNOSIS — I5022 Chronic systolic (congestive) heart failure: Secondary | ICD-10-CM | POA: Diagnosis not present

## 2022-07-04 DIAGNOSIS — E1140 Type 2 diabetes mellitus with diabetic neuropathy, unspecified: Secondary | ICD-10-CM | POA: Diagnosis not present

## 2022-07-04 DIAGNOSIS — Z4789 Encounter for other orthopedic aftercare: Secondary | ICD-10-CM | POA: Diagnosis not present

## 2022-07-05 DIAGNOSIS — N179 Acute kidney failure, unspecified: Secondary | ICD-10-CM | POA: Diagnosis not present

## 2022-07-05 DIAGNOSIS — I5020 Unspecified systolic (congestive) heart failure: Secondary | ICD-10-CM | POA: Diagnosis not present

## 2022-07-05 DIAGNOSIS — A419 Sepsis, unspecified organism: Secondary | ICD-10-CM | POA: Diagnosis not present

## 2022-07-05 DIAGNOSIS — E1140 Type 2 diabetes mellitus with diabetic neuropathy, unspecified: Secondary | ICD-10-CM | POA: Diagnosis not present

## 2022-07-06 ENCOUNTER — Encounter: Payer: No Typology Code available for payment source | Admitting: Family

## 2022-07-07 ENCOUNTER — Ambulatory Visit: Payer: Self-pay

## 2022-07-07 DIAGNOSIS — M6281 Muscle weakness (generalized): Secondary | ICD-10-CM | POA: Diagnosis not present

## 2022-07-07 DIAGNOSIS — Z4789 Encounter for other orthopedic aftercare: Secondary | ICD-10-CM | POA: Diagnosis not present

## 2022-07-07 DIAGNOSIS — I5022 Chronic systolic (congestive) heart failure: Secondary | ICD-10-CM | POA: Diagnosis not present

## 2022-07-07 DIAGNOSIS — R2681 Unsteadiness on feet: Secondary | ICD-10-CM | POA: Diagnosis not present

## 2022-07-07 DIAGNOSIS — R2689 Other abnormalities of gait and mobility: Secondary | ICD-10-CM | POA: Diagnosis not present

## 2022-07-07 NOTE — Patient Outreach (Signed)
  Care Coordination   Follow Up Visit Note   07/07/2022 Name: NAZIAH URDA MRN: [id number] DOB: 1950-11-07  [name] is a 72 y.o. year old male who sees Hoy Register, MD for primary care. I reviewed patient's chart in preparation to contact patient today.   What matters to the patients health and wellness today?  Patient remains to be inpatient at [facility] for acute rehab.     Goals Addressed             This Visit's Progress    No complications from Decubitus ulcer of right heel       Care Coordination Interventions: Reviewed chart in preparation to contact patient for RN CC follow up  Noted patient was discharged from [facility] on 06/30/22 and transported by ambulance to [facility] skilled nursing facility for acute rehabilitation         SDOH assessments and interventions completed:  No     Care Coordination Interventions:  Yes, provided   Follow up plan: Follow up call scheduled for 08/01/22 @12 :45 PM     Encounter Outcome:  Pt. Visit Completed

## 2022-07-11 ENCOUNTER — Ambulatory Visit (INDEPENDENT_AMBULATORY_CARE_PROVIDER_SITE_OTHER): Payer: No Typology Code available for payment source | Admitting: Orthopedic Surgery

## 2022-07-11 DIAGNOSIS — R2689 Other abnormalities of gait and mobility: Secondary | ICD-10-CM | POA: Diagnosis not present

## 2022-07-11 DIAGNOSIS — I5022 Chronic systolic (congestive) heart failure: Secondary | ICD-10-CM | POA: Diagnosis not present

## 2022-07-11 DIAGNOSIS — M86171 Other acute osteomyelitis, right ankle and foot: Secondary | ICD-10-CM | POA: Diagnosis not present

## 2022-07-11 DIAGNOSIS — Z4789 Encounter for other orthopedic aftercare: Secondary | ICD-10-CM | POA: Diagnosis not present

## 2022-07-11 DIAGNOSIS — R2681 Unsteadiness on feet: Secondary | ICD-10-CM | POA: Diagnosis not present

## 2022-07-11 DIAGNOSIS — M86271 Subacute osteomyelitis, right ankle and foot: Secondary | ICD-10-CM

## 2022-07-11 DIAGNOSIS — M6281 Muscle weakness (generalized): Secondary | ICD-10-CM | POA: Diagnosis not present

## 2022-07-11 DIAGNOSIS — N1831 Chronic kidney disease, stage 3a: Secondary | ICD-10-CM | POA: Diagnosis not present

## 2022-07-12 ENCOUNTER — Encounter: Payer: Self-pay | Admitting: Orthopedic Surgery

## 2022-07-12 ENCOUNTER — Ambulatory Visit: Payer: No Typology Code available for payment source | Admitting: Infectious Diseases
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
## 2022-07-13 ENCOUNTER — Telehealth: Payer: Self-pay

## 2022-07-13 NOTE — Telephone Encounter (Signed)
Casey informed

## 2022-07-13 NOTE — Telephone Encounter (Signed)
Baird Lyons with [employer] called. Patient is scheduled to be discharged for home this weekend. She wants to get orders changed for dressing changes 3x/week. Please advise. [phone]

## 2022-07-14 DIAGNOSIS — Z4789 Encounter for other orthopedic aftercare: Secondary | ICD-10-CM | POA: Diagnosis not present

## 2022-07-14 DIAGNOSIS — R2689 Other abnormalities of gait and mobility: Secondary | ICD-10-CM | POA: Diagnosis not present

## 2022-07-14 DIAGNOSIS — M6281 Muscle weakness (generalized): Secondary | ICD-10-CM | POA: Diagnosis not present

## 2022-07-14 DIAGNOSIS — I5022 Chronic systolic (congestive) heart failure: Secondary | ICD-10-CM | POA: Diagnosis not present

## 2022-07-14 DIAGNOSIS — R2681 Unsteadiness on feet: Secondary | ICD-10-CM | POA: Diagnosis not present

## 2022-07-15 ENCOUNTER — Ambulatory Visit (INDEPENDENT_AMBULATORY_CARE_PROVIDER_SITE_OTHER): Payer: Medicare HMO | Admitting: Physician Assistant

## 2022-07-15 VITALS — BP 128/82 | HR 74 | Temp 98.6°F | Resp 20 | Ht 67.0 in

## 2022-07-15 DIAGNOSIS — I5022 Chronic systolic (congestive) heart failure: Secondary | ICD-10-CM | POA: Diagnosis not present

## 2022-07-15 DIAGNOSIS — I70234 Atherosclerosis of native arteries of right leg with ulceration of heel and midfoot: Secondary | ICD-10-CM

## 2022-07-15 DIAGNOSIS — M6281 Muscle weakness (generalized): Secondary | ICD-10-CM | POA: Diagnosis not present

## 2022-07-15 DIAGNOSIS — E1140 Type 2 diabetes mellitus with diabetic neuropathy, unspecified: Secondary | ICD-10-CM | POA: Diagnosis not present

## 2022-07-15 DIAGNOSIS — M86171 Other acute osteomyelitis, right ankle and foot: Secondary | ICD-10-CM | POA: Diagnosis not present

## 2022-07-15 DIAGNOSIS — I6522 Occlusion and stenosis of left carotid artery: Secondary | ICD-10-CM

## 2022-07-15 NOTE — Progress Notes (Signed)
POST OPERATIVE OFFICE NOTE    CC:  F/u for surgery  HPI:  This is a 72 y.o. male who is s/p right superficial femoral artery endarterectomy with superficial femoral artery to anterior tibial artery bypass using vein by Dr. Karin [name] on 06/17/2022 due to critical limb ischemia with tissue loss of the heel.  He subsequently underwent debridement of the right heel with partial excision by Dr. Lajoyce Corners during the same hospitalization.  Patient states the incisions of the right leg are healing well.  He denies any rest pain of the right foot.  He was seen by Dr. Lajoyce Corners last week and states he has a follow-up visit next week for suture removal from the heel.  He is nonweightbearing on the heel.  He currently resides at the skilled nursing facility he was discharged to.  He is on aspirin, Eliquis, statin daily.  He denies tobacco use.  He plans to move to [place] on June 10.  It should also be noted he has a known in-stent stenosis of the left carotid stent.  Dr. Karin [name] plans to revascularize the left ICA from a TCAR approach once he has healed from his bypass surgery.  Allergies  Allergen Reactions   Codeine Nausea And Vomiting   Norco [Hydrocodone-Acetaminophen] Nausea And Vomiting   Percocet [Oxycodone-Acetaminophen] Nausea And Vomiting   Tramadol Hcl Nausea And Vomiting    Current Outpatient Medications  Medication Sig Dispense Refill   amoxicillin-clavulanate (AUGMENTIN) 875-125 MG tablet Take 1 tablet by mouth 2 (two) times daily for 23 days. 46 tablet 0   apixaban (ELIQUIS) 5 MG TABS tablet Take 1 tablet (5 mg total) by mouth 2 (two) times daily. 60 tablet 0   ARTIFICIAL TEARS PF 0.1-0.3 % SOLN Place 1 drop into both eyes every 8 (eight) hours as needed (for dryness).     aspirin EC 81 MG tablet Take 81 mg by mouth in the morning.     atorvastatin (LIPITOR) 40 MG tablet Take 40 mg by mouth in the morning.     carvedilol (COREG) 12.5 MG tablet Take 1 tablet (12.5 mg total) by mouth 2 (two) times  daily with a meal. 60 tablet 0   Cholecalciferol (VITAMIN D-3) 25 MCG (1000 UT) CAPS Take 1,000 Units by mouth daily.     Continuous Blood Gluc Sensor (FREESTYLE LIBRE 14 DAY SENSOR) MISC Inject 1 Device into the skin every 14 (fourteen) days.     ferrous sulfate 325 (65 FE) MG tablet Take 325 mg by mouth daily with breakfast.     furosemide (LASIX) 40 MG tablet Take 40 mg by mouth daily at 4 PM.     furosemide (LASIX) 80 MG tablet Take 1 tablet (80 mg total) by mouth daily. (Patient taking differently: Take 80 mg by mouth in the morning.) 30 tablet 2   gabapentin (NEURONTIN) 800 MG tablet Take 800 mg by mouth at bedtime as needed (Sleep).     hydrALAZINE (APRESOLINE) 10 MG tablet Take 1 tablet (10 mg total) by mouth 3 (three) times daily. 90 tablet 0   HYDROcodone-acetaminophen (NORCO/VICODIN) 5-325 MG tablet Take 1 tablet by mouth every 6 (six) hours as needed for moderate pain. 20 tablet 0   insulin NPH-regular Human (70-30) 100 UNIT/ML injection Inject 7-8 Units into the skin See admin instructions. Inject 8 units into the skin before breakfast and 7 units before the evening meal     isosorbide mononitrate (IMDUR) 30 MG 24 hr tablet Take 1 tablet (30 mg total) by  mouth daily. 30 tablet 3   NON FORMULARY Take 16 oz by mouth See admin instructions. [place] Signature Organic Raw Kombucha, Ginger Lemonade- Drink 16 ounces by mouth once a day     polyethylene glycol (MIRALAX / GLYCOLAX) 17 g packet Take 17 g by mouth daily. (Patient taking differently: Take 17 g by mouth daily as needed for mild constipation.) 14 each 0   Povidone-Iodine (BETADINE ANTISEPTIC EX) Apply 1 application  topically See admin instructions. Use as directed every other day for wound care (right heel site)     PRESCRIPTION MEDICATION Inject 0.1-1 mLs as directed See admin instructions. Tri-Mix Standard Strength 5 ml. Formula: Prostaglandin 10 mcg/ml, Papaverine 30 mg/ml, Phentolamine 1 mg/ml. Inject 0.1 ml into side of penis as  directed as needed (to be injected immediately before sexual intercourse) may increase the dose by 0.1 ml every 48 hours to achieve an erection. Max dose 1 ml.     sacubitril-valsartan (ENTRESTO) 49-51 MG Take 1 tablet by mouth 2 (two) times daily. 60 tablet 11   TYLENOL 325 MG CAPS Take 650 mg by mouth every 6 (six) hours as needed (for pain).     vitamin B-12 (CYANOCOBALAMIN) 500 MCG tablet Take 500 mcg by mouth in the morning.     No current facility-administered medications for this visit.     ROS:  See HPI  Physical Exam:  Vitals:   07/15/22 0850  BP: 128/82  Pulse: 74  Resp: 20  Temp: 98.6 F (37 C)  TempSrc: Temporal  SpO2: 100%  Height: 5\' 7"  (1.702 m)    Incision: Right groin and thigh incisions well-healed; staples intact all incisions of the right leg below the knee Extremities: Palpable bypass pulse lateral to the right knee; multiphasic DP signal by Doppler Neuro: A&O  Assessment/Plan:  This is a 72 y.o. male who is s/p: Right SFA endarterectomy with SFA to anterior tibial artery bypass with vein  -Right foot well-perfused with multiphasic DP signal by Doppler as well as a palpable bypass pulse around the level of the knee.  Groin and thigh incisions are well-healed.  Staples were removed from the below the knee incisions and steroids were applied.  We also wrapped his leg from foot to knee with Ace wrap to help with controlling edema.  He was encouraged to elevate his leg when possible during the day.  He knows he is nonweightbearing status on his heel.  He will follow-up with Dr. Lajoyce Corners for another wound check next week.  He will continue his Eliquis, aspirin, statin daily.  He is moving to [place] on June 10 however would prefer to have left-sided TCAR by Dr. Karin [name].  This will be scheduled once he has recovered from his bypass surgery as well as partial calcaneus excision.  I have scheduled him to have a bypass duplex and ABI in 1 month.   Caleb Rutter,  PA-C [facility] [phone]  Clinic MD:  Caleb [name]

## 2022-07-18 ENCOUNTER — Other Ambulatory Visit: Payer: Self-pay | Admitting: Family Medicine

## 2022-07-18 NOTE — Telephone Encounter (Signed)
Medication Refill - Medication: HYDROcodone-acetaminophen (NORCO/VICODIN) 5-325 MG tablet ,  (was in rehab for 2 weeks, medication not prescribed by PCP )   Has the patient contacted their pharmacy? No.  Preferred Pharmacy (with phone number or street name):  [facility] [phone] [place], [place] - [address]  [address] [place] [address]  Phone: [phone] Fax: [phone]  Hours: Not open 24 hours     Has the patient been seen for an appointment in the last year OR does the patient have an upcoming appointment? Yes.    Agent: Please be advised that RX refills may take up to 3 business days. We ask that you follow-up with your pharmacy.

## 2022-07-19 ENCOUNTER — Other Ambulatory Visit: Payer: Self-pay | Admitting: Orthopaedic Surgery

## 2022-07-19 ENCOUNTER — Telehealth: Payer: Self-pay | Admitting: Orthopedic Surgery

## 2022-07-19 ENCOUNTER — Telehealth: Payer: Self-pay | Admitting: Family Medicine

## 2022-07-19 DIAGNOSIS — T8149XA Infection following a procedure, other surgical site, initial encounter: Secondary | ICD-10-CM | POA: Diagnosis not present

## 2022-07-19 DIAGNOSIS — I5022 Chronic systolic (congestive) heart failure: Secondary | ICD-10-CM | POA: Diagnosis not present

## 2022-07-19 DIAGNOSIS — M86171 Other acute osteomyelitis, right ankle and foot: Secondary | ICD-10-CM | POA: Diagnosis not present

## 2022-07-19 DIAGNOSIS — I130 Hypertensive heart and chronic kidney disease with heart failure and stage 1 through stage 4 chronic kidney disease, or unspecified chronic kidney disease: Secondary | ICD-10-CM | POA: Diagnosis not present

## 2022-07-19 DIAGNOSIS — E1169 Type 2 diabetes mellitus with other specified complication: Secondary | ICD-10-CM | POA: Diagnosis not present

## 2022-07-19 DIAGNOSIS — L97419 Non-pressure chronic ulcer of right heel and midfoot with unspecified severity: Secondary | ICD-10-CM | POA: Diagnosis not present

## 2022-07-19 DIAGNOSIS — A4189 Other specified sepsis: Secondary | ICD-10-CM | POA: Diagnosis not present

## 2022-07-19 DIAGNOSIS — B951 Streptococcus, group B, as the cause of diseases classified elsewhere: Secondary | ICD-10-CM | POA: Diagnosis not present

## 2022-07-19 DIAGNOSIS — E11621 Type 2 diabetes mellitus with foot ulcer: Secondary | ICD-10-CM | POA: Diagnosis not present

## 2022-07-19 MED ORDER — HYDROCODONE-ACETAMINOPHEN 5-325 MG PO TABS: 1.0000 | ORAL_TABLET | Freq: Four times a day (QID) | ORAL | 0 refills | Status: DC | PRN

## 2022-07-19 NOTE — Telephone Encounter (Signed)
Copied from CRM [id number]. Topic: General - Other >> Jul 18, 2022  5:15 PM Everette C wrote: Reason for CRM: The patient has called to follow up with a member of clinical staff on their request for HYDROcodone-acetaminophen (NORCO/VICODIN) 5-325 MG tablet ,  Please contact further when possible

## 2022-07-19 NOTE — Telephone Encounter (Signed)
Pt called and informed that he will have to contact his ortho doctor for refills of medication.

## 2022-07-19 NOTE — Telephone Encounter (Signed)
Patient was informed that he needs Hydrocodone refill please advise

## 2022-07-19 NOTE — Telephone Encounter (Signed)
Requested medication (s) are due for refill today: routing for review  Requested medication (s) are on the active medication list: yes  Last refill:  06/30/22  Future visit scheduled: yes  Notes to clinic:  Unable to refill per protocol, cannot delegate.      Requested Prescriptions  Pending Prescriptions Disp Refills   HYDROcodone-acetaminophen (NORCO/VICODIN) 5-325 MG tablet 20 tablet 0    Sig: Take 1 tablet by mouth every 6 (six) hours as needed for moderate pain.     Not Delegated - Analgesics:  Opioid Agonist Combinations Failed - 07/18/2022 12:29 PM      Failed - This refill cannot be delegated      Failed - Urine Drug Screen completed in last 360 days      Passed - Valid encounter within last 3 months    Recent Outpatient Visits           2 months ago Hypertension associated with diabetes [facility])   [facility] [place], Odette Horns, MD   6 months ago Hypertension associated with diabetes [facility])   [facility] [place], [place], MD   8 months ago Type 2 diabetes mellitus with diabetic peripheral angiopathy without gangrene, with long-term current use of insulin (HCC)   [facility] [place], [place], MD   12 months ago Type 2 diabetes mellitus with diabetic peripheral angiopathy without gangrene, with long-term current use of insulin (HCC)   [facility] [place], Odette Horns, MD   1 year ago Type 2 diabetes mellitus with diabetic peripheral angiopathy without gangrene, with long-term current use of insulin [facility])   [place] [facility] Hoy Register, MD       Future Appointments             In 6 days Hoy Register, MD [facility]   In 6 days Nadara Mustard, MD [place]   In 5 months   [id number]

## 2022-07-20 DIAGNOSIS — E1169 Type 2 diabetes mellitus with other specified complication: Secondary | ICD-10-CM | POA: Diagnosis not present

## 2022-07-20 DIAGNOSIS — E11621 Type 2 diabetes mellitus with foot ulcer: Secondary | ICD-10-CM | POA: Diagnosis not present

## 2022-07-20 DIAGNOSIS — M86171 Other acute osteomyelitis, right ankle and foot: Secondary | ICD-10-CM | POA: Diagnosis not present

## 2022-07-20 DIAGNOSIS — I5022 Chronic systolic (congestive) heart failure: Secondary | ICD-10-CM | POA: Diagnosis not present

## 2022-07-20 DIAGNOSIS — A4189 Other specified sepsis: Secondary | ICD-10-CM | POA: Diagnosis not present

## 2022-07-20 DIAGNOSIS — I130 Hypertensive heart and chronic kidney disease with heart failure and stage 1 through stage 4 chronic kidney disease, or unspecified chronic kidney disease: Secondary | ICD-10-CM | POA: Diagnosis not present

## 2022-07-20 DIAGNOSIS — T8149XA Infection following a procedure, other surgical site, initial encounter: Secondary | ICD-10-CM | POA: Diagnosis not present

## 2022-07-20 DIAGNOSIS — B951 Streptococcus, group B, as the cause of diseases classified elsewhere: Secondary | ICD-10-CM | POA: Diagnosis not present

## 2022-07-20 DIAGNOSIS — L97419 Non-pressure chronic ulcer of right heel and midfoot with unspecified severity: Secondary | ICD-10-CM | POA: Diagnosis not present

## 2022-07-21 DIAGNOSIS — E11621 Type 2 diabetes mellitus with foot ulcer: Secondary | ICD-10-CM | POA: Diagnosis not present

## 2022-07-21 DIAGNOSIS — T8149XA Infection following a procedure, other surgical site, initial encounter: Secondary | ICD-10-CM | POA: Diagnosis not present

## 2022-07-21 DIAGNOSIS — I130 Hypertensive heart and chronic kidney disease with heart failure and stage 1 through stage 4 chronic kidney disease, or unspecified chronic kidney disease: Secondary | ICD-10-CM | POA: Diagnosis not present

## 2022-07-21 DIAGNOSIS — M86171 Other acute osteomyelitis, right ankle and foot: Secondary | ICD-10-CM | POA: Diagnosis not present

## 2022-07-21 DIAGNOSIS — A4189 Other specified sepsis: Secondary | ICD-10-CM | POA: Diagnosis not present

## 2022-07-21 DIAGNOSIS — L97419 Non-pressure chronic ulcer of right heel and midfoot with unspecified severity: Secondary | ICD-10-CM | POA: Diagnosis not present

## 2022-07-21 DIAGNOSIS — E1169 Type 2 diabetes mellitus with other specified complication: Secondary | ICD-10-CM | POA: Diagnosis not present

## 2022-07-21 DIAGNOSIS — B951 Streptococcus, group B, as the cause of diseases classified elsewhere: Secondary | ICD-10-CM | POA: Diagnosis not present

## 2022-07-21 DIAGNOSIS — I5022 Chronic systolic (congestive) heart failure: Secondary | ICD-10-CM | POA: Diagnosis not present

## 2022-07-22 DIAGNOSIS — M86171 Other acute osteomyelitis, right ankle and foot: Secondary | ICD-10-CM | POA: Diagnosis not present

## 2022-07-22 DIAGNOSIS — I130 Hypertensive heart and chronic kidney disease with heart failure and stage 1 through stage 4 chronic kidney disease, or unspecified chronic kidney disease: Secondary | ICD-10-CM | POA: Diagnosis not present

## 2022-07-22 DIAGNOSIS — E11621 Type 2 diabetes mellitus with foot ulcer: Secondary | ICD-10-CM | POA: Diagnosis not present

## 2022-07-22 DIAGNOSIS — L97419 Non-pressure chronic ulcer of right heel and midfoot with unspecified severity: Secondary | ICD-10-CM | POA: Diagnosis not present

## 2022-07-22 DIAGNOSIS — T8149XA Infection following a procedure, other surgical site, initial encounter: Secondary | ICD-10-CM | POA: Diagnosis not present

## 2022-07-22 DIAGNOSIS — I5022 Chronic systolic (congestive) heart failure: Secondary | ICD-10-CM | POA: Diagnosis not present

## 2022-07-22 DIAGNOSIS — E1169 Type 2 diabetes mellitus with other specified complication: Secondary | ICD-10-CM | POA: Diagnosis not present

## 2022-07-22 DIAGNOSIS — A4189 Other specified sepsis: Secondary | ICD-10-CM | POA: Diagnosis not present

## 2022-07-22 DIAGNOSIS — B951 Streptococcus, group B, as the cause of diseases classified elsewhere: Secondary | ICD-10-CM | POA: Diagnosis not present

## 2022-07-25 ENCOUNTER — Ambulatory Visit (INDEPENDENT_AMBULATORY_CARE_PROVIDER_SITE_OTHER): Payer: No Typology Code available for payment source | Admitting: Orthopedic Surgery

## 2022-07-25 ENCOUNTER — Other Ambulatory Visit: Payer: Self-pay

## 2022-07-25 ENCOUNTER — Encounter: Payer: Self-pay | Admitting: Family Medicine

## 2022-07-25 ENCOUNTER — Encounter: Payer: Self-pay | Admitting: Orthopedic Surgery

## 2022-07-25 ENCOUNTER — Ambulatory Visit: Payer: No Typology Code available for payment source | Attending: Family Medicine | Admitting: Family Medicine

## 2022-07-25 VITALS — BP 102/70 | HR 98 | Ht 67.0 in | Wt 167.2 lb

## 2022-07-25 DIAGNOSIS — I739 Peripheral vascular disease, unspecified: Secondary | ICD-10-CM

## 2022-07-25 DIAGNOSIS — I5042 Chronic combined systolic (congestive) and diastolic (congestive) heart failure: Secondary | ICD-10-CM

## 2022-07-25 DIAGNOSIS — E1169 Type 2 diabetes mellitus with other specified complication: Secondary | ICD-10-CM

## 2022-07-25 DIAGNOSIS — M86271 Subacute osteomyelitis, right ankle and foot: Secondary | ICD-10-CM

## 2022-07-25 DIAGNOSIS — I70234 Atherosclerosis of native arteries of right leg with ulceration of heel and midfoot: Secondary | ICD-10-CM

## 2022-07-25 DIAGNOSIS — Z952 Presence of prosthetic heart valve: Secondary | ICD-10-CM

## 2022-07-25 DIAGNOSIS — R6889 Other general symptoms and signs: Secondary | ICD-10-CM | POA: Diagnosis not present

## 2022-07-25 DIAGNOSIS — E785 Hyperlipidemia, unspecified: Secondary | ICD-10-CM

## 2022-07-25 DIAGNOSIS — R110 Nausea: Secondary | ICD-10-CM | POA: Diagnosis not present

## 2022-07-25 DIAGNOSIS — Z794 Long term (current) use of insulin: Secondary | ICD-10-CM

## 2022-07-25 DIAGNOSIS — I110 Hypertensive heart disease with heart failure: Secondary | ICD-10-CM

## 2022-07-25 LAB — POCT GLYCOSYLATED HEMOGLOBIN (HGB A1C): HbA1c, POC (controlled diabetic range): 7.5 % — AB (ref 0.0–7.0)

## 2022-07-25 MED ORDER — FUROSEMIDE 40 MG PO TABS: 40.0000 mg | ORAL_TABLET | Freq: Every day | ORAL | 1 refills | Status: DC

## 2022-07-25 MED ORDER — CARVEDILOL 12.5 MG PO TABS
12.5000 mg | ORAL_TABLET | Freq: Two times a day (BID) | ORAL | 1 refills | Status: DC
Start: 2022-07-25 — End: 2022-08-15

## 2022-07-25 MED ORDER — ONDANSETRON HCL 4 MG PO TABS: 4.0000 mg | ORAL_TABLET | Freq: Three times a day (TID) | ORAL | 1 refills | Status: DC | PRN

## 2022-07-25 MED ORDER — APIXABAN 5 MG PO TABS: 5.0000 mg | ORAL_TABLET | Freq: Two times a day (BID) | ORAL | 1 refills | Status: DC

## 2022-07-25 MED ORDER — HYDRALAZINE HCL 10 MG PO TABS
10.0000 mg | ORAL_TABLET | Freq: Three times a day (TID) | ORAL | 0 refills | Status: DC
Start: 2022-07-25 — End: 2022-08-22

## 2022-07-25 MED ORDER — FUROSEMIDE 80 MG PO TABS: 80.0000 mg | ORAL_TABLET | Freq: Every morning | ORAL | 1 refills | Status: DC

## 2022-07-25 NOTE — Patient Instructions (Signed)
Peripheral Vascular Disease  Peripheral vascular disease (PVD) is a disease of the blood vessels. PVD may also be called peripheral artery disease (PAD) or poor circulation. PVD is the blocking or hardening of the arteries anywhere within the circulatory system beyond the heart. This can result in a decreased supply of blood to the arms, legs, and internal organs, such as the stomach or kidneys. However, PVD most often affects a person's lower legs and feet. Without treatment, PVD often worsens. PVD can lead to acute limb ischemia. This occurs when an arm or leg suddenly has trouble getting enough blood. This is a medical emergency. What are the causes? The most common cause of PVD is atherosclerosis. This is a buildup of fatty material and other substances (plaque)inside your arteries. Pieces of plaque can break off from the walls of an artery and become stuck in a smaller artery, blocking blood flow and possibly causing acute limb ischemia. Other common causes of PVD include: Blood clots that form inside the blood vessels. Injuries to blood vessels. Diseases that cause inflammation of blood vessels or cause blood vessel tightening (spasms). What increases the risk? The following factors may make you more likely to develop this condition: A family history of PVD. Common medical conditions, including: High cholesterol. Diabetes. High blood pressure (hypertension). Heart disease. Known atherosclerotic disease in another area of the body. Past injury, such as burns or a broken bone. Other medical conditions, such as: Buerger's disease. This is caused by inflamed blood vessels in your hands and feet. Some forms of arthritis. Birth defects that affect the arteries in your legs. Kidney disease. Using tobacco and nicotine products. Not getting enough exercise. Obesity. Being age 65 or older, or being age 50 or older and having the other risk factors. What are the signs or symptoms? This  condition may cause different symptoms. Your symptoms depend on what body part is not getting enough blood. Common signs and symptoms include: Cramps in your buttocks, legs, and feet. Intermittent claudication. This is pain and weakness in your legs during activity that resolves with rest. Leg pain at rest and leg numbness, tingling, or weakness. Coldness in a leg or foot, especially when compared to the other leg or foot. Skin or hair changes. These can include: Hair loss. Shiny skin. Pale or bluish skin. Thick toenails. Inability to get or maintain an erection (erectile dysfunction). Tiredness (fatigue). Weak pulse or no pulse in the feet. People with PVD are more likely to develop open wounds (ulcers) and sores on their toes, feet, or legs. The ulcers or sores may take longer than normal to heal. How is this diagnosed? PVD is diagnosed based on your signs and symptoms, a physical exam, and your medical history. You may also have other tests to find the cause. Tests include: Ankle-brachial index test.This test compares the blood pressure readings of the legs and arms. This may also include an exercise ankle-brachial index test in which you walk on a treadmill to check your symptoms. Doppler ultrasound. This takes pictures of blood flow through your blood vessels. Imaging studies that use dye to show blood flow. These are: CT angiogram. Magnetic resonance angiogram, or MRA. How is this treated? Treatment for PVD depends on the cause of your condition, how severe your symptoms are, and your age. Underlying causes need to be treated and controlled. These include long-term (chronic) conditions, such as diabetes, high cholesterol, and hypertension. Treatment may include: Lifestyle changes, such as: Quitting tobacco use. Exercising regularly. Following a   low-fat, low-cholesterol diet. Not drinking alcohol. Taking medicines, such as: Blood thinners to prevent blood clots. Medicines to  improve blood flow. Medicines to improve cholesterol levels. Procedures, such as: Angioplasty. This uses an inflated balloon to open a blocked artery and improve blood flow. Stent implant. This inserts a small mesh tube to keep a blocked artery open. Peripheral bypass surgery. This reroutes blood flow around a blocked artery. Surgery to remove dead tissue from an infected wound (debridement). Amputation. This is surgical removal of the affected limb. It may be necessary in cases of acute limb ischemia when medical or surgical treatments have not helped. Follow these instructions at home: Medicines Take over-the-counter and prescription medicines only as told by your health care provider. If you are taking blood thinners: Talk with your health care provider before you take any medicines that contain aspirin or NSAIDs, such as ibuprofen. These medicines increase your risk for dangerous bleeding. Take your medicine exactly as told, at the same time every day. Avoid activities that could cause injury or bruising, and follow instructions about how to prevent falls. Wear a medical alert bracelet or carry a card that lists what medicines you take. Lifestyle     Exercise regularly. Ask your health care provider about some good activities for you. Talk with your health care provider about maintaining a healthy weight. If needed, ask about losing weight. Eat a diet that is low in fat and cholesterol. If you need help, talk with your health care provider. Do not drink alcohol. Do not use any products that contain nicotine or tobacco. These products include cigarettes, chewing tobacco, and vaping devices, such as e-cigarettes. If you need help quitting, ask your health care provider. General instructions Take good care of your feet. To do this: Wear comfortable shoes that fit well. Check your feet often for any cuts or sores. Get an annual influenza vaccine. Keep all follow-up visits. This is  important. Where to find more information Society for Vascular Surgery: vascular.org American Heart Association: heart.org National Heart, Lung, and Blood Institute: nhlbi.nih.gov Contact a health care provider if: You have leg cramps while walking. You have leg pain when you rest. Your leg or foot feels cold. Your skin changes color. You have erectile dysfunction. You have cuts or sores on your legs or feet that do not heal. Get help right away if: You have sudden changes in color and feeling of your arms or legs, such as: Your arm or leg turns cold, numb, and blue. Your arm or leg becomes red, warm, swollen, painful, or numb. You have any symptoms of a stroke. "BE FAST" is an easy way to remember the main warning signs of a stroke: B - Balance. Signs are dizziness, sudden trouble walking, or loss of balance. E - Eyes. Signs are trouble seeing or a sudden change in vision. F - Face. Signs are sudden weakness or numbness of the face, or the face or eyelid drooping on one side. A - Arms. Signs are weakness or numbness in an arm. This happens suddenly and usually on one side of the body. S - Speech. Signs are sudden trouble speaking, slurred speech, or trouble understanding what people say. T - Time. Time to call emergency services. Write down what time symptoms started. You have other signs of a stroke, such as: A sudden, severe headache with no known cause. Nausea or vomiting. Seizure. You have chest pain or trouble breathing. These symptoms may represent a serious problem that is an emergency.   Do not wait to see if the symptoms will go away. Get medical help right away. Call your local emergency services (911 in the [place] Do not drive yourself to the hospital. Summary Peripheral vascular disease (PVD) is a disease of the blood vessels. PVD is the blocking or hardening of the arteries anywhere within the circulatory system beyond the heart. PVD may cause different symptoms. Your  symptoms depend on what part of your body is not getting enough blood. Treatment for PVD depends on what caused it, how severe your symptoms are, and your age. This information is not intended to replace advice given to you by your health care provider. Make sure you discuss any questions you have with your health care provider. Document Revised: 09/09/2019 Document Reviewed: 09/09/2019 [employer] Patient Education  2023 [employer]  

## 2022-07-25 NOTE — Progress Notes (Signed)
Subjective:  Patient ID: Caleb [name], male    DOB: 1950-08-31  Age: 72 y.o. MRN: [id number]  CC: Diabetes   HPI Caleb [name] is a 72 y.o. year old male with a history of type 2 diabetes mellitus (A1c 7.5), hypertension, hyperlipidemia, s/p L 4th and 5th  toe metatarsal amputation, PAD status post b/l popliteal artery intervention, bilateral carotid artery stenosis status post bilateral  transcarotid artery revascularization (in 11/2019 and 01/2020), HFrEF (EF 20%, global hypokinesis, grade 2 DD, down from 50-55% previously) AVR aortic valve stenosis s/p TVAR from 10/2021  , b/l carotid stenosis (status post TCAR).  He underwent right superficial femoral artery endarterectomy with superficial femoral artery to anterior tibial artery bypass in 05/2022 due to critical limb ischemia with tissue loss of the heel.  Also underwent partial calcaneal excision and debridement by orthopedic due to presence of subacute osteomyelitis.  Interval History:  Visit with vascular surgery was a little over a week ago and notes reviewed with plans for revascularization of left ICA in-stent stenosis of left carotid stent. His dressing change is performed 2x/week by his home health nurse.  Ambulates with a walker. He has a follow-up with Dr. Lajoyce Corners later this month. He gets nauseous with his opioid analgesics and he needs a Prescription for Zofran. He will be moving to [place] where his 3 children are along with the grandchildren and will be establishing care there.  His A1c is 7.5 and he endorses adherence with 8 units in the morning and 7 units in the evening of his insulin.  He has not had hypoglycemia. From a cardiac standpoint he is doing well with no chest pain, dyspnea or palpitations. He has no additional concerns today. Past Medical History:  Diagnosis Date   Carotid artery occlusion    Diabetes mellitus without complication (HCC)    Type II   Dyslipidemia 09/27/2012   Erectile dysfunction 09/27/2012    GERD (gastroesophageal reflux disease)    Hypercholesteremia    Hyperlipidemia 08/12/2012   Hypertension    Hyponatremia 08/14/2012   Neuropathy    Pancreatitis    Pancreatitis, acute 09/27/2012   Peripheral vascular disease (HCC)    S/P 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 Excell Seltzer & Bartle   Severe aortic stenosis    Vitamin D deficiency 06/23/2015    Past Surgical History:  Procedure Laterality Date   ABDOMINAL AORTOGRAM W/LOWER EXTREMITY N/A 08/08/2019   Procedure: ABDOMINAL AORTOGRAM W/LOWER EXTREMITY;  Surgeon: Runell Gess, MD;  Location: [facility] INVASIVE CV LAB;  Service: Cardiovascular;  Laterality: N/A;   ABDOMINAL AORTOGRAM W/LOWER EXTREMITY N/A 11/18/2019   Procedure: ABDOMINAL AORTOGRAM W/LOWER EXTREMITY;  Surgeon: Runell Gess, MD;  Location: [facility] INVASIVE CV LAB;  Service: Cardiovascular;  Laterality: N/A;   ABDOMINAL AORTOGRAM W/LOWER EXTREMITY N/A 06/13/2022   Procedure: ABDOMINAL AORTOGRAM W/LOWER EXTREMITY;  Surgeon: Chuck Hint, MD;  Location: [facility] INVASIVE CV LAB;  Service: Cardiovascular;  Laterality: N/A;   APPLICATION OF WOUND VAC  06/24/2022   Procedure: APPLICATION OF WOUND VAC;  Surgeon: Nadara Mustard, MD;  Location: [facility] OR;  Service: Orthopedics;;   CARDIAC CATHETERIZATION     COLONOSCOPY  12 years ago   in [place] clinic= normal exam per pt   ENDARTERECTOMY FEMORAL Right 06/17/2022   Procedure: SUPERFICIAL ENDARTERECTOMY OF COMMON FEMORAL ARTERY;  Surgeon: Victorino Sparrow, MD;  Location: [facility] OR;  Service: Vascular;  Laterality: Right;   FEMORAL-TIBIAL BYPASS GRAFT Right 06/17/2022   Procedure: RIGHT FEMORAL- ANTERIOR TIBIAL ARTERY BYPASS;  Surgeon: Victorino Sparrow, MD;  Location: [facility] OR;  Service: Vascular;  Laterality: Right;   I & D EXTREMITY Right 06/24/2022   Procedure: RIGHT PARTIAL CALCANEUS EXCISION;  Surgeon: Nadara Mustard, MD;  Location: [facility] OR;  Service:  Orthopedics;  Laterality: Right;   INTRAOPERATIVE TRANSTHORACIC ECHOCARDIOGRAM N/A 01/11/2022   Procedure: INTRAOPERATIVE TRANSTHORACIC ECHOCARDIOGRAM;  Surgeon: Tonny Bollman, MD;  Location: [facility] INVASIVE CV LAB;  Service: Open Heart Surgery;  Laterality: N/A;   KNEE SURGERY     MULTIPLE EXTRACTIONS WITH ALVEOLOPLASTY N/A 12/09/2021   Procedure: MULTIPLE EXTRACTION;  Surgeon: Sharman Cheek, DMD;  Location: [facility] OR;  Service: Dentistry;  Laterality: N/A;   PERIPHERAL VASCULAR INTERVENTION Right 08/08/2019   Procedure: PERIPHERAL VASCULAR INTERVENTION;  Surgeon: Runell Gess, MD;  Location: [facility] INVASIVE CV LAB;  Service: Cardiovascular;  Laterality: Right;   PERIPHERAL VASCULAR INTERVENTION Left 11/18/2019   Procedure: PERIPHERAL VASCULAR INTERVENTION;  Surgeon: Runell Gess, MD;  Location: [facility] INVASIVE CV LAB;  Service: Cardiovascular;  Laterality: Left;  left popliteal artery   RIGHT/LEFT HEART CATH AND CORONARY ANGIOGRAPHY N/A 10/29/2021   Procedure: RIGHT/LEFT HEART CATH AND CORONARY ANGIOGRAPHY;  Surgeon: Kathleene Hazel, MD;  Location: [facility] INVASIVE CV LAB;  Service: Cardiovascular;  Laterality: N/A;   TRANSCAROTID ARTERY REVASCULARIZATION  Left 12/16/2019   Procedure: TRANSCAROTID ARTERY REVASCULARIZATION;  Surgeon: Sherren Kerns, MD;  Location: [facility] OR;  Service: Vascular;  Laterality: Left;   TRANSCAROTID ARTERY REVASCULARIZATION  Right 02/03/2020   Procedure: RIGHT TRANSCAROTID ARTERY REVASCULARIZATION;  Surgeon: Sherren Kerns, MD;  Location: [facility] OR;  Service: Vascular;  Laterality: Right;   TRANSCATHETER AORTIC VALVE REPLACEMENT, TRANSFEMORAL N/A 01/11/2022   Procedure: Transcatheter Aortic Valve Replacement, Transfemoral;  Surgeon: Tonny Bollman, MD;  Location: [facility] INVASIVE CV LAB;  Service: Open Heart Surgery;  Laterality: N/A;   ULTRASOUND GUIDANCE FOR VASCULAR ACCESS Right 12/16/2019   Procedure: ULTRASOUND GUIDANCE FOR VASCULAR ACCESS;  Surgeon: Sherren Kerns, MD;  Location: [facility] OR;  Service: Vascular;  Laterality: Right;   ULTRASOUND GUIDANCE FOR VASCULAR ACCESS Right 02/03/2020   Procedure: ULTRASOUND GUIDANCE FOR VASCULAR ACCESS;  Surgeon: Sherren Kerns, MD;  Location: [facility] OR;  Service: Vascular;  Laterality: Right;   VASECTOMY     VEIN HARVEST Right 06/17/2022   Procedure: IPSILATERAL RIGHT GREATER SAPHENOUS VEIN HARVEST;  Surgeon: Victorino Sparrow, MD;  [facility] OR;  Service: Vascular;  Laterality: Right;    Family History  Problem Relation Age of Onset   CAD Father    Hypertension Father    Alcohol abuse Father        Cause of death   Diabetes Mother    Colon polyps Mother    CAD Brother 13       CABG   Colon cancer Neg Hx    Esophageal cancer Neg Hx    Rectal cancer Neg Hx    Stomach cancer Neg Hx     Social History   Socioeconomic History   Marital status: Widowed    Spouse name: Not on file   Number of children: 3   Years of education: Not on file   Highest education level: High school graduate  Occupational History   Occupation: Caregiver    Comment: Special needs    Comment: retired  Tobacco Use   Smoking status: Never    Passive exposure: Never  Smokeless tobacco: Never  Vaping Use   Vaping Use: Never used  Substance and Sexual Activity   Alcohol use: Yes    Comment: occasional   Drug use: No   Sexual activity: Not Currently  Other Topics Concern   Not on file  Social History Narrative   Widower.  3 daughters and 3 grandchildren.     Social Determinants of Health   Financial Resource Strain: Low Risk  (10/29/2021)   Overall Financial Resource Strain (CARDIA)    Difficulty of Paying Living Expenses: Not hard at all  Food Insecurity: No Food Insecurity (06/11/2022)   Hunger Vital Sign    Worried About Running Out of Food in the Last Year: Never true    Ran Out of Food in the Last Year: Never true  Transportation Needs: No Transportation Needs (06/11/2022)   PRAPARE - Therapist, art (Medical): No    Lack of Transportation (Non-Medical): No  Physical Activity: Sufficiently Active (12/06/2020)   Exercise Vital Sign    Days of Exercise per Week: 4 days    Minutes of Exercise per Session: 60 min  Stress: No Stress Concern Present (12/06/2020)   [employer] of Occupational Health - Occupational Stress Questionnaire    Feeling of Stress : Not at all  Social Connections: Moderately Isolated (12/06/2020)   Social Connection and Isolation Panel [NHANES]    Frequency of Communication with Friends and Family: More than three times a week    Frequency of Social Gatherings with Friends and Family: More than three times a week    Attends Religious Services: More than 4 times per year    Active Member of [employer] or Organizations: No    Attends Banker Meetings: Never    Marital Status: Widowed    Allergies  Allergen Reactions   Codeine Nausea And Vomiting   Norco [Hydrocodone-Acetaminophen] Nausea And Vomiting   Percocet [Oxycodone-Acetaminophen] Nausea And Vomiting   Tramadol Hcl Nausea And Vomiting    Outpatient Medications Prior to Visit  Medication Sig Dispense Refill   ARTIFICIAL TEARS PF 0.1-0.3 % SOLN Place 1 drop into both eyes every 8 (eight) hours as needed (for dryness).     aspirin EC 81 MG tablet Take 81 mg by mouth in the morning.     atorvastatin (LIPITOR) 40 MG tablet Take 40 mg by mouth in the morning.     Cholecalciferol (VITAMIN D-3) 25 MCG (1000 UT) CAPS Take 1,000 Units by mouth daily.     Continuous Blood Gluc Sensor (FREESTYLE LIBRE 14 DAY SENSOR) MISC Inject 1 Device into the skin every 14 (fourteen) days.     ferrous sulfate 325 (65 FE) MG tablet Take 325 mg by mouth daily with breakfast.     gabapentin (NEURONTIN) 800 MG tablet Take 800 mg by mouth at bedtime as needed (Sleep).     HYDROcodone-acetaminophen (NORCO/VICODIN) 5-325 MG tablet Take 1 tablet by mouth every 6 (six) hours as needed for moderate pain. 30  tablet 0   insulin NPH-regular Human (70-30) 100 UNIT/ML injection Inject 7-8 Units into the skin See admin instructions. Inject 8 units into the skin before breakfast and 7 units before the evening meal     isosorbide mononitrate (IMDUR) 30 MG 24 hr tablet Take 1 tablet (30 mg total) by mouth daily. 30 tablet 3   NON FORMULARY Take 16 oz by mouth See admin instructions. [employer] Signature Organic Raw Kombucha, Ginger Lemonade- Drink 16 ounces by mouth once  a day     polyethylene glycol (MIRALAX / GLYCOLAX) 17 g packet Take 17 g by mouth daily. (Patient taking differently: Take 17 g by mouth daily as needed for mild constipation.) 14 each 0   Povidone-Iodine (BETADINE ANTISEPTIC EX) Apply 1 application  topically See admin instructions. Use as directed every other day for wound care (right heel site)     PRESCRIPTION MEDICATION Inject 0.1-1 mLs as directed See admin instructions. Tri-Mix Standard Strength 5 ml. Formula: Prostaglandin 10 mcg/ml, Papaverine 30 mg/ml, Phentolamine 1 mg/ml. Inject 0.1 ml into side of penis as directed as needed (to be injected immediately before sexual intercourse) may increase the dose by 0.1 ml every 48 hours to achieve an erection. Max dose 1 ml.     sacubitril-valsartan (ENTRESTO) 49-51 MG Take 1 tablet by mouth 2 (two) times daily. 60 tablet 11   TYLENOL 325 MG CAPS Take 650 mg by mouth every 6 (six) hours as needed (for pain).     vitamin B-12 (CYANOCOBALAMIN) 500 MCG tablet Take 500 mcg by mouth in the morning.     apixaban (ELIQUIS) 5 MG TABS tablet Take 1 tablet (5 mg total) by mouth 2 (two) times daily. 60 tablet 0   carvedilol (COREG) 12.5 MG tablet Take 1 tablet (12.5 mg total) by mouth 2 (two) times daily with a meal. 60 tablet 0   furosemide (LASIX) 40 MG tablet Take 40 mg by mouth daily at 4 PM.     furosemide (LASIX) 80 MG tablet Take 1 tablet (80 mg total) by mouth daily. (Patient taking differently: Take 80 mg by mouth in the morning.) 30 tablet 2    hydrALAZINE (APRESOLINE) 10 MG tablet Take 1 tablet (10 mg total) by mouth 3 (three) times daily. 90 tablet 0   No facility-administered medications prior to visit.     ROS Review of Systems  Constitutional:  Negative for activity change and appetite change.  HENT:  Negative for sinus pressure and sore throat.   Respiratory:  Negative for chest tightness, shortness of breath and wheezing.   Cardiovascular:  Negative for chest pain and palpitations.  Gastrointestinal:  Negative for abdominal distention, abdominal pain and constipation.  Genitourinary: Negative.   Musculoskeletal: Negative.   Psychiatric/Behavioral:  Negative for behavioral problems and dysphoric mood.     Objective:  BP 102/70   Pulse 98   Ht 5\' 7"  (1.702 m)   Wt 167 lb 3.2 oz (75.8 kg)   SpO2 100%   BMI 26.19 kg/m      07/25/2022   10:57 AM 07/15/2022    8:50 AM 06/30/2022    7:47 PM  BP/Weight  Systolic BP 102 128 104  Diastolic BP 70 82 72  Wt. (Lbs) 167.2    BMI 26.19 kg/m2        Physical Exam Constitutional:      Appearance: He is well-developed.  Cardiovascular:     Rate and Rhythm: Normal rate.     Heart sounds: Normal heart sounds. No murmur heard. Pulmonary:     Effort: Pulmonary effort is normal.     Breath sounds: Normal breath sounds. No wheezing or rales.  Chest:     Chest wall: No tenderness.  Abdominal:     General: Bowel sounds are normal. There is no distension.     Palpations: Abdomen is soft. There is no mass.     Tenderness: There is no abdominal tenderness.  Musculoskeletal:     Right lower leg: No edema.  Left lower leg: No edema.     Comments: Right foot in a boot  Neurological:     Mental Status: He is alert and oriented to person, place, and time.  Psychiatric:        Mood and Affect: Mood normal.        Latest Ref Rng & Units 06/28/2022    2:55 AM 06/27/2022    1:04 AM 06/26/2022    1:18 AM  CMP  Glucose 70 - 99 mg/dL 161  096  045   BUN 8 - 23 mg/dL 47  43   42   Creatinine 0.61 - 1.24 mg/dL 4.09  8.11  9.14   Sodium 135 - 145 mmol/L 132  131  129   Potassium 3.5 - 5.1 mmol/L 3.8  4.5  4.3   Chloride 98 - 111 mmol/L 99  98  98   CO2 22 - 32 mmol/L 27  28  25    Calcium 8.9 - 10.3 mg/dL 8.2  8.0  7.9   Total Protein 6.5 - 8.1 g/dL   6.2   Total Bilirubin 0.3 - 1.2 mg/dL   0.5   Alkaline Phos 38 - 126 U/L   90   AST 15 - 41 U/L   15   ALT 0 - 44 U/L   11     Lipid Panel     Component Value Date/Time   CHOL 88 06/14/2022 0119   CHOL 106 06/05/2020 0947   TRIG 60 06/14/2022 0119   HDL 33 (L) 06/14/2022 0119   HDL 53 06/05/2020 0947   CHOLHDL 2.7 06/14/2022 0119   VLDL 12 06/14/2022 0119   LDLCALC 43 06/14/2022 0119   LDLCALC 29 06/05/2020 0947    CBC    Component Value Date/Time   WBC 7.2 06/30/2022 0159   RBC 3.46 (L) 06/30/2022 0159   HGB 9.3 (L) 06/30/2022 0159   HGB 14.6 03/09/2022 1233   HGB 13.3 11/15/2019 1139   HCT 28.3 (L) 06/30/2022 0159   HCT 40.2 11/15/2019 1139   PLT 344 06/30/2022 0159   PLT 216 03/09/2022 1233   PLT 300 11/15/2019 1139   MCV 81.8 06/30/2022 0159   MCV 86 11/15/2019 1139   MCH 26.9 06/30/2022 0159   MCHC 32.9 06/30/2022 0159   RDW 16.8 (H) 06/30/2022 0159   RDW 14.5 11/15/2019 1139   LYMPHSABS 1.7 06/25/2022 0706   LYMPHSABS 3.3 (H) 09/28/2017 1113   MONOABS 1.3 (H) 06/25/2022 0706   EOSABS 0.1 06/25/2022 0706   EOSABS 0.3 09/28/2017 1113   BASOSABS 0.0 06/25/2022 0706   BASOSABS 0.0 09/28/2017 1113    Lab Results  Component Value Date   HGBA1C 7.5 (A) 07/25/2022    Assessment & Plan:  1. Type 2 diabetes mellitus with other specified complication, with long-term current use of insulin (HCC) Controlled with A1c of 7.5, goal is less than 7.0 Continue with current regimen Counseled on Diabetic diet, my plate method, 782 minutes of moderate intensity exercise/week Blood sugar logs with fasting goals of 80-120 mg/dl, random of less than 956 and in the event of sugars less than 60  mg/dl or greater than 213 mg/dl encouraged to notify the clinic. Advised on the need for annual eye exams, annual foot exams, Pneumonia vaccine. - POCT glycosylated hemoglobin (Hb A1C)  2. Hypertensive heart disease with chronic combined systolic and diastolic congestive heart failure (HCC) EF 25 to 30%, left ventricular global hypokinesis, mild LVH, grade 2 DD, dilated IVC from  echo 05/2022 Continue hydralazine, carvedilol and Entresto - carvedilol (COREG) 12.5 MG tablet; Take 1 tablet (12.5 mg total) by mouth 2 (two) times daily with a meal.  Dispense: 180 tablet; Refill: 1 - furosemide (LASIX) 80 MG tablet; Take 1 tablet (80 mg total) by mouth in the morning. And 1 tablet (40 mg) in the evening  Dispense: 90 tablet; Refill: 1 - hydrALAZINE (APRESOLINE) 10 MG tablet; Take 1 tablet (10 mg total) by mouth 3 (three) times daily.  Dispense: 90 tablet; Refill: 0 - furosemide (LASIX) 40 MG tablet; Take 1 tablet (40 mg total) by mouth daily at 4 PM. And 1 tablet (80 mg) daily in the morning  Dispense: 90 tablet; Refill: 1  3. Peripheral vascular disease (HCC) Status post multiple revascularization procedures Risk factor modification is imperative Continue Eliquis, statin, aspirin  4. Dyslipidemia associated with type 2 diabetes mellitus (HCC) Controlled Low-cholesterol diet Continue statin  5. S/P TAVR (transcatheter aortic valve replacement) Asymptomatic  6. Subacute osteomyelitis of right foot (HCC) Status post partial calcaneus resection Continue with twice weekly wound dressings per home health nurse Continue to wear boot as directed by orthopedic Keep appointment orthopedic  7. Nausea Side effect from his opioid analgesic - ondansetron (ZOFRAN) 4 MG tablet; Take 1 tablet (4 mg total) by mouth every 8 (eight) hours as needed for nausea or vomiting.  Dispense: 90 tablet; Refill: 1  Meds ordered this encounter  Medications   ondansetron (ZOFRAN) 4 MG tablet    Sig: Take 1 tablet (4  mg total) by mouth every 8 (eight) hours as needed for nausea or vomiting.    Dispense:  90 tablet    Refill:  1   apixaban (ELIQUIS) 5 MG TABS tablet    Sig: Take 1 tablet (5 mg total) by mouth 2 (two) times daily.    Dispense:  180 tablet    Refill:  1   carvedilol (COREG) 12.5 MG tablet    Sig: Take 1 tablet (12.5 mg total) by mouth 2 (two) times daily with a meal.    Dispense:  180 tablet    Refill:  1   furosemide (LASIX) 80 MG tablet    Sig: Take 1 tablet (80 mg total) by mouth in the morning. And 1 tablet (40 mg) in the evening    Dispense:  90 tablet    Refill:  1   hydrALAZINE (APRESOLINE) 10 MG tablet    Sig: Take 1 tablet (10 mg total) by mouth 3 (three) times daily.    Dispense:  90 tablet    Refill:  0   furosemide (LASIX) 40 MG tablet    Sig: Take 1 tablet (40 mg total) by mouth daily at 4 PM. And 1 tablet (80 mg) daily in the morning    Dispense:  90 tablet    Refill:  1    Follow-up: Return for Medical conditions, he will be establishing with a new PCP in [place].       Hoy Register, MD, FAAFP. [facility] [place], [place] [phone]   07/25/2022, 12:37 PM

## 2022-07-26 ENCOUNTER — Ambulatory Visit: Payer: Self-pay

## 2022-07-26 ENCOUNTER — Encounter (HOSPITAL_COMMUNITY): Payer: Self-pay | Admitting: Orthopedic Surgery

## 2022-07-26 ENCOUNTER — Encounter: Payer: Self-pay | Admitting: Orthopedic Surgery

## 2022-07-26 ENCOUNTER — Ambulatory Visit: Payer: No Typology Code available for payment source | Admitting: Internal Medicine

## 2022-07-26 ENCOUNTER — Other Ambulatory Visit: Payer: Self-pay

## 2022-07-26 DIAGNOSIS — I130 Hypertensive heart and chronic kidney disease with heart failure and stage 1 through stage 4 chronic kidney disease, or unspecified chronic kidney disease: Secondary | ICD-10-CM | POA: Diagnosis not present

## 2022-07-26 DIAGNOSIS — M86171 Other acute osteomyelitis, right ankle and foot: Secondary | ICD-10-CM | POA: Diagnosis not present

## 2022-07-26 DIAGNOSIS — E1169 Type 2 diabetes mellitus with other specified complication: Secondary | ICD-10-CM | POA: Diagnosis not present

## 2022-07-26 DIAGNOSIS — A4189 Other specified sepsis: Secondary | ICD-10-CM | POA: Diagnosis not present

## 2022-07-26 DIAGNOSIS — L97419 Non-pressure chronic ulcer of right heel and midfoot with unspecified severity: Secondary | ICD-10-CM | POA: Diagnosis not present

## 2022-07-26 DIAGNOSIS — E11621 Type 2 diabetes mellitus with foot ulcer: Secondary | ICD-10-CM | POA: Diagnosis not present

## 2022-07-26 DIAGNOSIS — T8149XA Infection following a procedure, other surgical site, initial encounter: Secondary | ICD-10-CM | POA: Diagnosis not present

## 2022-07-26 DIAGNOSIS — I5022 Chronic systolic (congestive) heart failure: Secondary | ICD-10-CM | POA: Diagnosis not present

## 2022-07-26 DIAGNOSIS — B951 Streptococcus, group B, as the cause of diseases classified elsewhere: Secondary | ICD-10-CM | POA: Diagnosis not present

## 2022-07-26 NOTE — Progress Notes (Addendum)
[name] denies chest pain or shortness of breath.  Patient denies having any s/s of Covid in his household, also denies any known exposure to Covid. [name] denies any s/s of upper or lower respiratory in the past 8 weeks. Patient reports that he has a runny nose when he is cold.  PCP is Dr. Risa Grill, cardiologist is Dr. Erlene Quan.  [name] has type II diabetes, patient reports that CBgs run around 100-110. I instructed patient to take 5 units of Novolin - Regular 70/30, with evening meal; today, I instructed [name] to not take 70/30 Insulin in am nor and medication for DM. I instructed patient to check CBG after awaking and every 2 hours until arrival  to the hospital.  I Instructed patient if CBG is less than 70 to take 4 Glucose Tablets or 1 tube of Glucose Gel or 1/2 cup of a clear juice. Recheck CBG in 15 minutes if CBG is not over 70 call, pre- op desk at [phone] for further instructions.

## 2022-07-26 NOTE — H&P (Signed)
Caleb [name] is an 72 y.o. male.   Chief Complaint: Drainage and dehiscence heel wound. HPI: Patient is a 72 year old gentleman who was seen in follow-up 4 weeks status post right partial calcaneal excision with reinforcement with tissue graft.  Patient has been in a PRAFO nonweightbearing.   Patient is status post revascularization with vascular vein surgery.  Past Medical History:  Diagnosis Date   Carotid artery occlusion    CHF (congestive heart failure) (HCC)    Diabetes mellitus without complication (HCC)    Type II   Dyslipidemia 09/27/2012   Erectile dysfunction 09/27/2012   GERD (gastroesophageal reflux disease)    Hypercholesteremia    Hyperlipidemia 08/12/2012   Hypertension    Hyponatremia 08/14/2012   Neuropathy    Pancreatitis    Pancreatitis, acute 09/27/2012   Peripheral vascular disease (HCC)    S/P 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 Excell Seltzer & Bartle   Severe aortic stenosis    Vitamin D deficiency 06/23/2015    Past Surgical History:  Procedure Laterality Date   ABDOMINAL AORTOGRAM W/LOWER EXTREMITY N/A 08/08/2019   Procedure: ABDOMINAL AORTOGRAM W/LOWER EXTREMITY;  Surgeon: Runell Gess, MD;  Location: [facility] INVASIVE CV LAB;  Service: Cardiovascular;  Laterality: N/A;   ABDOMINAL AORTOGRAM W/LOWER EXTREMITY N/A 11/18/2019   Procedure: ABDOMINAL AORTOGRAM W/LOWER EXTREMITY;  Surgeon: Runell Gess, MD;  [facility] INVAS[facility] CV LAB;  Service: Cardiovascular;  Laterality: N/A;   ABDOMINAL AORTOGRAM W/LOWER EXTREMITY N/A 06/13/2022   Procedure: ABDOMINAL AORTOGRAM W/LOWER EXTREMITY;  Surgeon: Chuck Hint, MD;  Location: [facility] INVASIVE CV LAB;  Service: Cardiovascular;  Laterality: N/A;   APPLICATION OF WOUND VAC  06/24/2022   Procedure: APPLICATION OF WOUND VAC;  Surgeon: Nadara Mustard, MD;  [facility] OR;  Service: Orthopedics;;   CARDIAC CATHETERIZATION      COLONOSCOPY  12 years ago   in [place] clinic= normal exam per pt   ENDARTERECTOMY FEMORAL Right 06/17/2022   Procedure: SUPERFICIAL ENDARTERECTOMY OF COMMON FEMORAL ARTERY;  Surgeon: Victorino Sparrow, MD;  Location: [facility] OR;  Service: Vascular;  Laterality: Right;   FEMORAL-TIBIAL BYPASS GRAFT Right 06/17/2022   Procedure: RIGHT FEMORAL- ANTERIOR TIBIAL ARTERY BYPASS;  Surgeon: Victorino Sparrow, MD;  Location: [facility] OR;  Service: Vascular;  Laterality: Right;   I & D EXTREMITY Right 06/24/2022   Procedure: RIGHT PARTIAL CALCANEUS EXCISION;  Surgeon: Nadara Mustard, MD;  [facility];  Service: Orthopedics;  Laterality: Right;   INTRAOPERATIVE TRANSTHORACIC ECHOCARDIOGRAM N/A 01/11/2022   Procedure: INTRAOPERATIVE TRANSTHORACIC ECHOCARDIOGRAM;  Surgeon: Tonny Bollman, MD;  Location: [facility] INVASIVE CV LAB;  Service: Open Heart Surgery;  Laterality: N/A;   KNEE SURGERY     MULTIPLE EXTRACTIONS WITH ALVEOLOPLASTY N/A 12/09/2021   Procedure: MULTIPLE EXTRACTION;  Surgeon: Sharman Cheek, DMD;  [facility] OR;  Service: Dentistry;  Laterality: N/A;   PERIPHERAL VASCULAR INTERVENTION Right 08/08/2019   Procedure: PERIPHERAL VASCULAR INTERVENTION;  Surgeon: Runell Gess, MD;  [facility] INVAS[facility] CV LAB;  Service: Cardiovascular;  Laterality: Right;   PERIPHERAL VASCULAR INTERVENTION Left 11/18/2019   Procedure: PERIPHERAL VASCULAR INTERVENTION;  Surgeon: Runell Gess, MD;  [facility] INVAS[facility] CV LAB;  Service: Cardiovascular;  Laterality: Left;  left popliteal artery   RIGHT/LEFT HEART CATH AND CORONARY ANGIOGRAPHY N/A 10/29/2021   Procedure: RIGHT/LEFT HEART CATH AND CORONARY ANGIOGRAPHY;  Surgeon: Kathleene Hazel, MD;  Location: [facility]  INVASIVE CV LAB;  Service: Cardiovascular;  Laterality: N/A;   TRANSCAROTID ARTERY REVASCULARIZATION  Left 12/16/2019   Procedure: TRANSCAROTID ARTERY REVASCULARIZATION;  Surgeon: Sherren Kerns, MD;  Location: [facility] OR;  Service: Vascular;  Laterality: Left;    TRANSCAROTID ARTERY REVASCULARIZATION  Right 02/03/2020   Procedure: RIGHT TRANSCAROTID ARTERY REVASCULARIZATION;  Surgeon: Sherren Kerns, MD;  Location: [facility] OR;  Service: Vascular;  Laterality: Right;   TRANSCATHETER AORTIC VALVE REPLACEMENT, TRANSFEMORAL N/A 01/11/2022   Procedure: Transcatheter Aortic Valve Replacement, Transfemoral;  Surgeon: Tonny Bollman, MD;  Location: [facility] INVASIVE CV LAB;  Service: Open Heart Surgery;  Laterality: N/A;   ULTRASOUND GUIDANCE FOR VASCULAR ACCESS Right 12/16/2019   Procedure: ULTRASOUND GUIDANCE FOR VASCULAR ACCESS;  Surgeon: Sherren Kerns, MD;  Location: [facility] OR;  Service: Vascular;  Laterality: Right;   ULTRASOUND GUIDANCE FOR VASCULAR ACCESS Right 02/03/2020   Procedure: ULTRASOUND GUIDANCE FOR VASCULAR ACCESS;  Surgeon: Sherren Kerns, MD;  Location: [facility] OR;  Service: Vascular;  Laterality: Right;   VASECTOMY     VEIN HARVEST Right 06/17/2022   Procedure: IPSILATERAL RIGHT GREATER SAPHENOUS VEIN HARVEST;  Surgeon: Victorino Sparrow, MD;  Location: [facility] OR;  Service: Vascular;  Laterality: Right;    Family History  Problem Relation Age of Onset   CAD Father    Hypertension Father    Alcohol abuse Father        Cause of death   Diabetes Mother    Colon polyps Mother    CAD Brother 46       CABG   Colon cancer Neg Hx    Esophageal cancer Neg Hx    Rectal cancer Neg Hx    Stomach cancer Neg Hx    Social History:  reports that he has never smoked. He has never been exposed to tobacco smoke. He has never used smokeless tobacco. He reports current alcohol use. He reports that he does not use drugs.  Allergies:  Allergies  Allergen Reactions   Codeine Nausea And Vomiting   Norco [Hydrocodone-Acetaminophen] Nausea And Vomiting   Percocet [Oxycodone-Acetaminophen] Nausea And Vomiting   Tramadol Hcl Nausea And Vomiting    No medications prior to admission.    Results for orders placed or performed in visit on 07/25/22 (from the past 48  hour(s))  POCT glycosylated hemoglobin (Hb A1C)     Status: Abnormal   Collection Time: 07/25/22 11:03 AM  Result Value Ref Range   Hemoglobin A1C     HbA1c POC (<> result, manual entry)     HbA1c, POC (prediabetic range)     HbA1c, POC (controlled diabetic range) 7.5 (A) 0.0 - 7.0 %   No results found.  Review of Systems  All other systems reviewed and are negative.   There were no vitals taken for this visit. Physical Exam  Patient is alert, oriented, no adenopathy, well-dressed, normal affect, normal respiratory effort. Examination patient has complete wound dehiscence with green around the edges there is exposed bone.  Patient is status post revascularization with a palpable dorsalis pedis pulse he has good circulation to the ankle but has significant microcirculation deficiency. Assessment/Plan 1. Subacute osteomyelitis of right foot (HCC)       Plan: Patient has progressive wound dehiscence with exposed calcaneus.  Will post for surgery Wednesday for further calcaneal debridement further tissue debridement and tissue reinforcement.  Risks and benefits including potential for amputation.  Nadara Mustard, MD 07/26/2022, 5:07 PM
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
## 2022-07-26 NOTE — Telephone Encounter (Signed)
Summary: med info for pt   Beth from [place] called in, wanting to report that pt isnt taking eloquis because isnt covered by insurance, also is taking lasix 80 mig in the morning and 40 mg at night.

## 2022-07-27 ENCOUNTER — Ambulatory Visit: Payer: No Typology Code available for payment source | Admitting: Internal Medicine

## 2022-07-27 NOTE — Progress Notes (Signed)
Patient voiced understanding that his surgery has been rescheduled for Friday, time is to be determined.

## 2022-07-28 ENCOUNTER — Encounter (HOSPITAL_COMMUNITY): Payer: Self-pay | Admitting: Orthopedic Surgery

## 2022-07-28 NOTE — Anesthesia Preprocedure Evaluation (Addendum)
Anesthesia Evaluation  Patient identified by MRN, date of birth, ID band Patient awake    Reviewed: Allergy & Precautions, NPO status , Patient's Chart, lab work & pertinent test results  History of Anesthesia Complications Negative for: history of anesthetic complications  Airway Mallampati: I  TM Distance: >3 FB Neck ROM: Full    Dental no notable dental hx. (+) Missing, Dental Advisory Given   Pulmonary neg pulmonary ROS   Pulmonary exam normal breath sounds clear to auscultation       Cardiovascular hypertension, Pt. on medications and Pt. on home beta blockers pulmonary hypertension(-) angina + Peripheral Vascular Disease and +CHF (Entresto)  Normal cardiovascular exam+ Valvular Problems/Murmurs (s/p TAVR)  Rhythm:Regular Rate:Normal  05/28/2022 ECHO: EF 25-30%, severely decreased LVF, mild LVH, Grade 2 DD, mild RVF, S/p TAVR, mild MR   Neuro/Psych   Anxiety        GI/Hepatic Neg liver ROS,neg GERD  ,,H/o pancreatitis   Endo/Other  diabetes, Insulin Dependent    Renal/GU Renal InsufficiencyRenal disease     Musculoskeletal negative musculoskeletal ROS (+)    Abdominal   Peds  Hematology  (+) Blood dyscrasia (Hb 11.6, plt 348K), anemia Stopped eliquis   Anesthesia Other Findings All: codeine, norco, percocet, tramadol  Reproductive/Obstetrics                             Anesthesia Physical Anesthesia Plan  ASA: 4  Anesthesia Plan: Regional and MAC   Post-op Pain Management: Tylenol PO (pre-op)* and Precedex   Induction:   PONV Risk Score and Plan: 2 and Propofol infusion and Ondansetron  Airway Management Planned: Natural Airway and Nasal Cannula  Additional Equipment: None  Intra-op Plan:   Post-operative Plan:   Informed Consent:      Dental advisory given  Plan Discussed with: CRNA and Anesthesiologist  Anesthesia Plan Comments: (PAT note written 07/28/2022 by  Shonna Chock, PA-C.  R popliteal   )        Anesthesia Quick Evaluation

## 2022-07-28 NOTE — Progress Notes (Signed)
Anesthesia Chart Review: Caleb [name]  Case: [id number] Date/Time: 07/29/22 0844   Procedure: RIGHT PARTIAL CALCANEUS EXCISION (Right)   Anesthesia type: Choice   Pre-op diagnosis: Dehiscence Right Foot Wound   [facility] 06 / [facility]   Surgeons: Nadara Mustard, MD       DISCUSSION: Patient is a 72 year old male scheduled for the above procedure.  He is s/p right FTBG on 06/17/22 and right partial calcaneus excision on 06/24/22.   History includes never smoker, CAD (moderate, non-obstructive 10/29/21), murmur/severe AS (s/p 26 mm Edwards Sapien TAVR 01/11/22), chronic HFrEF, HTN, hypercholesterolemia, CKD, DM2, neuropathy, pancreatitis (08/2009, 07/2012), carotid artery disease (s/p left TCAR 12/16/19, right TCAR 02/03/20), PAD (s/p right popliteal & SFA atherectomy/angioplasty 08/08/19; PTA/stenting left popliteal artery 11/18/19; right SFA endarterectomy, right SFA-AT bypass with GSV graft 06/17/22), MGUS. 11/16/21 CT coronary showed small PFO.    Notes suggest he has been on ASA, Plavix or Eliquis, and statin as part of PAD management. He is not currently taking Eliquis, but has been on ASA 81 mg and Plavix 75 mg daily. Perioperative instructions per Dr. Lajoyce Corners.   Anesthesia team to evaluate on the day of surgery.    VS:  BP Readings from Last 3 Encounters:  07/25/22 102/70  07/15/22 128/82  06/30/22 104/72   Pulse Readings from Last 3 Encounters:  07/25/22 98  07/15/22 74  06/30/22 82     PROVIDERS: Hoy Register, MD is PCP   Nanetta Batty, MD is cardiologist  Marca Ancona, MD is HF cardiologist. Dorita Sciara, Aditya, DO on 03/07/22 for HF/cardiac amyloid evaluation. He felt etiology of HF likely valvular versus hypertensive, "significant LVH on TTE that I do not believe to be secondary to cardiac amyloid.Marland KitchenMarland KitchenOn my evaluation, PYP scan is at most Grade 1 uptake and is not suggestive of ATTR cardiac amyloid. He has no family history of the disease either. LVH on TTE likely  due to hypertension and aortic stenosis. EKG also consistent with LVH.  - Multiple myeloma panel is also not suggestive of amyloid. Although he has a IgG/MGUS, this is likely due to underlying CKD. Will allow hematology to evaluate further."  Tonny Bollman, MD is structural heart cardiologist. Evelene Croon, MD is CT surgeon.  Artis Delay, MD is HEM-ONC  Shamleffer, ibtehal, MD is endocrinologist   LABS: Most recent lab results in [facility] include: Lab Results  Component Value Date   WBC 7.2 06/30/2022   HGB 9.3 (L) 06/30/2022   HCT 28.3 (L) 06/30/2022   PLT 344 06/30/2022   GLUCOSE 151 (H) 06/28/2022   ALT 11 06/26/2022   AST 15 06/26/2022   NA 132 (L) 06/28/2022   K 3.8 06/28/2022   CL 99 06/28/2022   CREATININE 1.44 (H) 06/28/2022   BUN 47 (H) 06/28/2022   CO2 27 06/28/2022   HGBA1C 7.5 (A) 07/25/2022     OTHER: Pre TAVR Assessment: 5 M Walk Test 12/06/21: 67M=16.91ft 5 Meter Walk Test- trial 1: 6.97 seconds 5 Meter Walk Test- trial 2: 6.87 seconds 5 Meter Walk Test- trial 3: 6.41 seconds 5 Meter Walk Test Average: 6.75 seconds     IMAGES: 1V PCXR 06/23/22: FINDINGS: Normal lung volumes. No focal consolidations. No pleural effusion or pneumothorax. Similar mildly enlarged cardiomediastinal silhouette status post valve replacement. The visualized skeletal structures are unremarkable. IMPRESSION: No active disease.   MRI Right foot 06/17/22: IMPRESSION: 1. Acute osteomyelitis of the posterolateral aspect of the calcaneus. 2. Increased intramuscular signal of the flexor hallucis  longus and plantar muscles of the foot suggesting diabetic myopathy/myositis. 3. Deep skin wound with subcutaneous edema about the posterolateral aspect of the heel suggesting chronic diabetic ulcer. No fluid collection or abscess.  CTA Chest/abd/pelvis 11/16/21: IMPRESSION: 1. Vascular findings and measurements pertinent to potential TAVR procedure, as detailed. 2. Diffuse thickening and  coarse calcification of the aortic valve, compatible with the reported clinical history of severe aortic stenosis. 3. Three-vessel coronary atherosclerosis. 4. Tiny right middle lobe 0.3 cm pulmonary nodule, decreased in size since recent 10/28/2021 CT, most likely a resolving benign inflammatory nodule. 5. 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 6. Aortic Atherosclerosis (ICD10-I70.0).     EKG: EKG 06/13/22: Normal sinus rhythm ST & T wave abnormality, consider lateral ischemia Abnormal ECG When compared with ECG of 08-Jun-2022 09:20, QT has shortened [QT 376 ms, QTcB 454 ms] Confirmed by Riley Lam [phone]) on 06/13/2022 1:43:27 PM    CV: Echo 05/28/22: IMPRESSIONS   1. Left ventricular ejection fraction, by estimation, is 25 to 30%. Left  ventricular ejection fraction by 2D MOD biplane is 26.8 %. The left  ventricle has severely decreased function. The left ventricle demonstrates  global hypokinesis. There is mild  left ventricular hypertrophy. Left ventricular diastolic parameters are  consistent with Grade II diastolic dysfunction (pseudonormalization).  Elevated left ventricular end-diastolic pressure. The E/e' is 61. There is  severe akinesis of the left  ventricular, mid-apical anteroseptal wall, septal wall, anterior wall,  inferoapical segment and anterolateral wall.   2. Right ventricular systolic function is mildly reduced. The right  ventricular size is normal. There is moderately elevated pulmonary artery  systolic pressure. The estimated right ventricular systolic pressure is  57.0 mmHg.   3. Left atrial size was mildly dilated.   4. The mitral valve is abnormal. Mild mitral valve regurgitation.   5. The aortic valve has been repaired/replaced. Aortic valve  regurgitation is not visualized. There is a 26 mm Sapien prosthetic (TAVR)  valve present in the aortic position. Procedure Date: 01/11/22. Aortic  valve area, by VTI  measures 1.03 cm. Aortic  valve mean gradient measures 8.5 mmHg. Aortic valve Vmax measures 2.05  m/s.   6. The inferior vena cava is dilated in size with <50% respiratory  variability, suggesting right atrial pressure of 15 mmHg.  - Comparison(s): Changes from prior study are noted. 01/12/2022: LVEF  20-25%, global HK, TAVR MG 5.7 mmHg.    US Carotid 04/28/22: Summary:  - Right Carotid: Patent right ICA stent without evidence of re-stenosis.  - Left Carotid: The ECA appears >50% stenosed. Left ICA stent with elevated velocities suggestive of >75% stenosis.  - Vertebrals:  Bilateral vertebral arteries demonstrate antegrade flow.  - Subclavians: Normal flow hemodynamics were seen in bilateral subclavian arteries.    NM PYP Cardiac Amyloidosis Scan 01/12/22: IMPRESSION: Visual and quantitative assessment (grade 1, H/CL = 1.24) are is equivocally suggestive of transthyretin amyloidosis.    CT Coronary 11/16/21 (PRE-TAVR): IMPRESSION: 1. Severe aortic stenosis. Findings pertinent to TAVR procedure are detailed above. 2. Patient's total coronary artery calcium score is 2037, which is 98th percentile for subjects of the same age, gender, and race based populations. - Small PFO - Pulmonary veins: Normal anatomy. - Main Pulmonary artery: Mild dilation 30 mm - Left atrial appendage: Delayed contrast uptake into the left atrial appendage tip, reviewed with CAC sequence thrombus appears less probably.     RHC/LHC 10/29/21 (PRE-TAVR):   Prox RCA lesion is 60% stenosed.  Ramus lesion is 40% stenosed.   2nd Mrg lesion is 60% stenosed.   Mid LAD lesion is 30% stenosed.   Dist LAD lesion is 40% stenosed.   Mild non-obstructive disease in the mid and distal LAD Mild non-obstructive disease in the Circumflex and Ramus intermediate branches The RCA is a large dominant vessel with moderate proximal to mid stenosis that does not appear to be flow limiting.  Non-ischemic  cardiomyopathy Severe low flow/low gradient aortic stenosis by echo  Elevated right heart pressures.    Recommendations: I would recommend medical management of his non-obstructive CAD. He would be a candidate for TAVR or surgical AVR but given low EF, would favor TAVR...      Past Medical History:  Diagnosis Date   Carotid artery occlusion    CHF (congestive heart failure) (HCC)    Diabetes mellitus without complication (HCC)    Type II   Dyslipidemia 09/27/2012   Erectile dysfunction 09/27/2012   GERD (gastroesophageal reflux disease)    Hypercholesteremia    Hyperlipidemia 08/12/2012   Hypertension    Hyponatremia 08/14/2012   Neuropathy    Pancreatitis    Pancreatitis, acute 09/27/2012   Peripheral vascular disease (HCC)    S/P 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 Excell Seltzer & Bartle   Severe aortic stenosis    Vitamin D deficiency 06/23/2015    Past Surgical History:  Procedure Laterality Date   ABDOMINAL AORTOGRAM W/LOWER EXTREMITY N/A 08/08/2019   Procedure: ABDOMINAL AORTOGRAM W/LOWER EXTREMITY;  Surgeon: Runell Gess, MD;  Location[facility] INVASIVE CV LAB;  Service: Cardiovascular;  Laterality: N/A;   ABDOMINAL AORTOGRAM W/LOWER EXTREMITY N/A 11/18/2019   Procedure: ABDOMINAL AORTOGRAM W/LOWER EXTREMITY;  Surgeon: Runell Gess, MD;  [facility] INVAS[facility] LAB;  Service: Cardiovascular;  Laterality: N/A;   ABDOMINAL AORTOGRAM W/LOWER EXTREMITY N/A 06/13/2022   Procedure: ABDOMINAL AORTOGRAM W/LOWER EXTREMITY;  Surgeon: Chuck Hint, MD;  Location: [facility] INVASIVE CV LAB;  Service: Cardiovascular;  Laterality: N/A;   APPLICATION OF WOUND VAC  06/24/2022   Procedure: APPLICATION OF WOUND VAC;  Surgeon: Nadara Mustard, MD;  Location: [facility] OR;  Service: Orthopedics;;   CARDIAC CATHETERIZATION     COLONOSCOPY  12 years ago   in [place] clinic= normal exam per pt   ENDARTERECTOMY FEMORAL  Right 06/17/2022   Procedure: SUPERFICIAL ENDARTERECTOMY OF COMMON FEMORAL ARTERY;  Surgeon: Victorino Sparrow, MD;  Location: [facility] OR;  Service: Vascular;  Laterality: Right;   FEMORAL-TIBIAL BYPASS GRAFT Right 06/17/2022   Procedure: RIGHT FEMORAL- ANTERIOR TIBIAL ARTERY BYPASS;  Surgeon: Victorino Sparrow, MD;  Location: [facility] OR;  Service: Vascular;  Laterality: Right;   I & D EXTREMITY Right 06/24/2022   Procedure: RIGHT PARTIAL CALCANEUS EXCISION;  Surgeon: Nadara Mustard, MD;  [facility];  Service: Orthopedics;  Laterality: Right;   INTRAOPERATIVE TRANSTHORACIC ECHOCARDIOGRAM N/A 01/11/2022   Procedure: INTRAOPERATIVE TRANSTHORACIC ECHOCARDIOGRAM;  Surgeon: Tonny Bollman, MD;  Location: [facility] INVASIVE CV LAB;  Service: Open Heart Surgery;  Laterality: N/A;   KNEE SURGERY     MULTIPLE EXTRACTIONS WITH ALVEOLOPLASTY N/A 12/09/2021   Procedure: MULTIPLE EXTRACTION;  Surgeon: Sharman Cheek, DMD;  Location[facility];  Service: Dentistry;  Laterality: N/A;   PERIPHERAL VASCULAR INTERVENTION Right 08/08/2019   Procedure: PERIPHERAL VASCULAR INTERVENTION;  Surgeon: Runell Gess, MD;  [facility] INVAS[facility] CV LAB;  Service: Cardiovascular;  Laterality: Right;  PERIPHERAL VASCULAR INTERVENTION Left 11/18/2019   Procedure: PERIPHERAL VASCULAR INTERVENTION;  Surgeon: Runell Gess, MD;  [facility] CV LAB;  Service: Cardiovascular;  Laterality: Left;  left popliteal artery   RIGHT/LEFT HEART CATH AND CORONARY ANGIOGRAPHY N/A 10/29/2021   Procedure: RIGHT/LEFT HEART CATH AND CORONARY ANGIOGRAPHY;  Surgeon: Kathleene Hazel, MD;  [facility]VAS[facility] CV LAB;  Service: Cardiovascular;  Laterality: N/A;   TRANSCAROTID ARTERY REVASCULARIZATION  Left 12/16/2019   Procedure: TRANSCAROTID ARTERY REVASCULARIZATION;  Surgeon: Sherren Kerns, MD;  Location[facility] OR;  Service: Vascular;  Laterality: Left;   TRANSCAROTID ARTERY REVASCULARIZATION  Right 02/03/2020   Procedure: RIGHT  TRANSCAROTID ARTERY REVASCULARIZATION;  Surgeon: Sherren Kerns, MD;  Location: [facility] OR;  Service: Vascular;  Laterality: Right;   TRANSCATHETER AORTIC VALVE REPLACEMENT, TRANSFEMORAL N/A 01/11/2022   Procedure: Transcatheter Aortic Valve Replacement, Transfemoral;  Surgeon: Tonny Bollman, MD;  Location: [facility] INVASIVE CV LAB;  Service: Open Heart Surgery;  Laterality: N/A;   ULTRASOUND GUIDANCE FOR VASCULAR ACCESS Right 12/16/2019   Procedure: ULTRASOUND GUIDANCE FOR VASCULAR ACCESS;  Surgeon: Sherren Kerns, MD;  Location: [facility] OR;  Service: Vascular;  Laterality: Right;   ULTRASOUND GUIDANCE FOR VASCULAR ACCESS Right 02/03/2020   Procedure: ULTRASOUND GUIDANCE FOR VASCULAR ACCESS;  Surgeon: Sherren Kerns, MD;  Location: [facility] OR;  Service: Vascular;  Laterality: Right;   VASECTOMY     VEIN HARVEST Right 06/17/2022   Procedure: IPSILATERAL RIGHT GREATER SAPHENOUS VEIN HARVEST;  Surgeon: Victorino Sparrow, MD;  Location: [facility] OR;  Service: Vascular;  Laterality: Right;    MEDICATIONS: No current facility-administered medications for this encounter.    ARTIFICIAL TEARS PF 0.1-0.3 % SOLN   aspirin EC 81 MG tablet   atorvastatin (LIPITOR) 40 MG tablet   carvedilol (COREG) 12.5 MG tablet   Cholecalciferol (VITAMIN D-3) 25 MCG (1000 UT) CAPS   clopidogrel (PLAVIX) 75 MG tablet   ferrous sulfate 325 (65 FE) MG tablet   furosemide (LASIX) 40 MG tablet   gabapentin (NEURONTIN) 800 MG tablet   hydrALAZINE (APRESOLINE) 10 MG tablet   HYDROcodone-acetaminophen (NORCO/VICODIN) 5-325 MG tablet   insulin NPH-regular Human (70-30) 100 UNIT/ML injection   isosorbide mononitrate (IMDUR) 30 MG 24 hr tablet   NON FORMULARY   ondansetron (ZOFRAN) 4 MG tablet   polyethylene glycol (MIRALAX / GLYCOLAX) 17 g packet   Povidone-Iodine (BETADINE ANTISEPTIC EX)   TYLENOL 325 MG CAPS   vitamin B-12 (CYANOCOBALAMIN) 500 MCG tablet   apixaban (ELIQUIS) 5 MG TABS tablet   Continuous Blood Gluc Sensor (FREESTYLE  LIBRE 14 DAY SENSOR) MISC   furosemide (LASIX) 80 MG tablet   PRESCRIPTION MEDICATION   sacubitril-valsartan (ENTRESTO) 49-51 MG     Shonna Chock, PA-C Surgical Short Stay/Anesthesiology [facility] Phone [phone] [facility] Phone [phone] 07/28/2022 12:41 PM

## 2022-07-28 NOTE — Progress Notes (Signed)
Spoke with patients daughter, Cloretta Ned, updated her with new surgical arrival time of 0630, Friday Jul 29, 2022.

## 2022-07-29 ENCOUNTER — Other Ambulatory Visit: Payer: Self-pay

## 2022-07-29 ENCOUNTER — Inpatient Hospital Stay (HOSPITAL_COMMUNITY)
Admission: RE | Admit: 2022-07-29 | Discharge: 2022-08-02 | DRG: 904 | Disposition: A | Discharge status: Home Health | Payer: Medicare HMO | Attending: Orthopedic Surgery | Admitting: Orthopedic Surgery

## 2022-07-29 ENCOUNTER — Telehealth (HOSPITAL_COMMUNITY): Payer: Self-pay | Admitting: Pharmacy Technician

## 2022-07-29 ENCOUNTER — Ambulatory Visit (HOSPITAL_COMMUNITY): Payer: Medicare HMO | Admitting: Vascular Surgery

## 2022-07-29 ENCOUNTER — Encounter (HOSPITAL_COMMUNITY): Payer: Self-pay | Admitting: Orthopedic Surgery

## 2022-07-29 ENCOUNTER — Encounter (HOSPITAL_COMMUNITY)
Admission: RE | Disposition: A | Discharge status: Home Health | Payer: Self-pay | Source: Home / Self Care | Attending: Orthopedic Surgery

## 2022-07-29 ENCOUNTER — Other Ambulatory Visit (HOSPITAL_COMMUNITY): Payer: Self-pay

## 2022-07-29 DIAGNOSIS — I5022 Chronic systolic (congestive) heart failure: Secondary | ICD-10-CM | POA: Diagnosis not present

## 2022-07-29 DIAGNOSIS — I70203 Unspecified atherosclerosis of native arteries of extremities, bilateral legs: Secondary | ICD-10-CM | POA: Diagnosis present

## 2022-07-29 DIAGNOSIS — E1151 Type 2 diabetes mellitus with diabetic peripheral angiopathy without gangrene: Secondary | ICD-10-CM

## 2022-07-29 DIAGNOSIS — Z885 Allergy status to narcotic agent status: Secondary | ICD-10-CM

## 2022-07-29 DIAGNOSIS — Z7982 Long term (current) use of aspirin: Secondary | ICD-10-CM | POA: Diagnosis not present

## 2022-07-29 DIAGNOSIS — R1110 Vomiting, unspecified: Secondary | ICD-10-CM | POA: Diagnosis not present

## 2022-07-29 DIAGNOSIS — Z811 Family history of alcohol abuse and dependence: Secondary | ICD-10-CM

## 2022-07-29 DIAGNOSIS — K219 Gastro-esophageal reflux disease without esophagitis: Secondary | ICD-10-CM | POA: Diagnosis present

## 2022-07-29 DIAGNOSIS — R112 Nausea with vomiting, unspecified: Secondary | ICD-10-CM | POA: Diagnosis not present

## 2022-07-29 DIAGNOSIS — Q2112 Patent foramen ovale: Secondary | ICD-10-CM | POA: Diagnosis not present

## 2022-07-29 DIAGNOSIS — E854 Organ-limited amyloidosis: Secondary | ICD-10-CM | POA: Diagnosis present

## 2022-07-29 DIAGNOSIS — Z8249 Family history of ischemic heart disease and other diseases of the circulatory system: Secondary | ICD-10-CM

## 2022-07-29 DIAGNOSIS — I272 Pulmonary hypertension, unspecified: Secondary | ICD-10-CM | POA: Diagnosis present

## 2022-07-29 DIAGNOSIS — E1169 Type 2 diabetes mellitus with other specified complication: Secondary | ICD-10-CM | POA: Diagnosis present

## 2022-07-29 DIAGNOSIS — T8132XA Disruption of internal operation (surgical) wound, not elsewhere classified, initial encounter: Secondary | ICD-10-CM | POA: Diagnosis not present

## 2022-07-29 DIAGNOSIS — R2681 Unsteadiness on feet: Secondary | ICD-10-CM | POA: Diagnosis not present

## 2022-07-29 DIAGNOSIS — D472 Monoclonal gammopathy: Secondary | ICD-10-CM | POA: Diagnosis present

## 2022-07-29 DIAGNOSIS — I110 Hypertensive heart disease with heart failure: Secondary | ICD-10-CM

## 2022-07-29 DIAGNOSIS — M6281 Muscle weakness (generalized): Secondary | ICD-10-CM | POA: Diagnosis not present

## 2022-07-29 DIAGNOSIS — Z7901 Long term (current) use of anticoagulants: Secondary | ICD-10-CM | POA: Diagnosis not present

## 2022-07-29 DIAGNOSIS — E1140 Type 2 diabetes mellitus with diabetic neuropathy, unspecified: Secondary | ICD-10-CM | POA: Diagnosis present

## 2022-07-29 DIAGNOSIS — Z9889 Other specified postprocedural states: Secondary | ICD-10-CM

## 2022-07-29 DIAGNOSIS — Z952 Presence of prosthetic heart valve: Secondary | ICD-10-CM

## 2022-07-29 DIAGNOSIS — I2510 Atherosclerotic heart disease of native coronary artery without angina pectoris: Secondary | ICD-10-CM | POA: Diagnosis present

## 2022-07-29 DIAGNOSIS — Z83719 Family history of colon polyps, unspecified: Secondary | ICD-10-CM

## 2022-07-29 DIAGNOSIS — E785 Hyperlipidemia, unspecified: Secondary | ICD-10-CM | POA: Diagnosis not present

## 2022-07-29 DIAGNOSIS — Z794 Long term (current) use of insulin: Secondary | ICD-10-CM

## 2022-07-29 DIAGNOSIS — N1832 Chronic kidney disease, stage 3b: Secondary | ICD-10-CM | POA: Diagnosis not present

## 2022-07-29 DIAGNOSIS — R2689 Other abnormalities of gait and mobility: Secondary | ICD-10-CM | POA: Diagnosis not present

## 2022-07-29 DIAGNOSIS — Z833 Family history of diabetes mellitus: Secondary | ICD-10-CM

## 2022-07-29 DIAGNOSIS — R4182 Altered mental status, unspecified: Secondary | ICD-10-CM | POA: Diagnosis not present

## 2022-07-29 DIAGNOSIS — Z89422 Acquired absence of other left toe(s): Secondary | ICD-10-CM | POA: Diagnosis not present

## 2022-07-29 DIAGNOSIS — I509 Heart failure, unspecified: Secondary | ICD-10-CM

## 2022-07-29 DIAGNOSIS — M86271 Subacute osteomyelitis, right ankle and foot: Secondary | ICD-10-CM | POA: Diagnosis not present

## 2022-07-29 DIAGNOSIS — L859 Epidermal thickening, unspecified: Secondary | ICD-10-CM | POA: Diagnosis not present

## 2022-07-29 DIAGNOSIS — E1122 Type 2 diabetes mellitus with diabetic chronic kidney disease: Secondary | ICD-10-CM | POA: Diagnosis present

## 2022-07-29 DIAGNOSIS — I130 Hypertensive heart and chronic kidney disease with heart failure and stage 1 through stage 4 chronic kidney disease, or unspecified chronic kidney disease: Secondary | ICD-10-CM | POA: Diagnosis not present

## 2022-07-29 DIAGNOSIS — Z7902 Long term (current) use of antithrombotics/antiplatelets: Secondary | ICD-10-CM | POA: Diagnosis not present

## 2022-07-29 DIAGNOSIS — D649 Anemia, unspecified: Secondary | ICD-10-CM | POA: Diagnosis not present

## 2022-07-29 DIAGNOSIS — T85698A Other mechanical complication of other specified internal prosthetic devices, implants and grafts, initial encounter: Secondary | ICD-10-CM | POA: Diagnosis not present

## 2022-07-29 DIAGNOSIS — T8130XA Disruption of wound, unspecified, initial encounter: Secondary | ICD-10-CM

## 2022-07-29 DIAGNOSIS — Z95820 Peripheral vascular angioplasty status with implants and grafts: Secondary | ICD-10-CM | POA: Diagnosis not present

## 2022-07-29 DIAGNOSIS — E119 Type 2 diabetes mellitus without complications: Secondary | ICD-10-CM | POA: Diagnosis not present

## 2022-07-29 DIAGNOSIS — E559 Vitamin D deficiency, unspecified: Secondary | ICD-10-CM | POA: Diagnosis present

## 2022-07-29 DIAGNOSIS — L97514 Non-pressure chronic ulcer of other part of right foot with necrosis of bone: Secondary | ICD-10-CM | POA: Diagnosis not present

## 2022-07-29 DIAGNOSIS — I43 Cardiomyopathy in diseases classified elsewhere: Secondary | ICD-10-CM | POA: Diagnosis present

## 2022-07-29 DIAGNOSIS — N189 Chronic kidney disease, unspecified: Secondary | ICD-10-CM | POA: Diagnosis present

## 2022-07-29 DIAGNOSIS — Z79899 Other long term (current) drug therapy: Secondary | ICD-10-CM | POA: Diagnosis not present

## 2022-07-29 DIAGNOSIS — L97519 Non-pressure chronic ulcer of other part of right foot with unspecified severity: Secondary | ICD-10-CM | POA: Diagnosis not present

## 2022-07-29 DIAGNOSIS — R000 Tachycardia, unspecified: Secondary | ICD-10-CM | POA: Diagnosis not present

## 2022-07-29 DIAGNOSIS — E7800 Pure hypercholesterolemia, unspecified: Secondary | ICD-10-CM | POA: Diagnosis present

## 2022-07-29 DIAGNOSIS — E8582 Wild-type transthyretin-related (ATTR) amyloidosis: Secondary | ICD-10-CM

## 2022-07-29 DIAGNOSIS — L97504 Non-pressure chronic ulcer of other part of unspecified foot with necrosis of bone: Principal | ICD-10-CM | POA: Diagnosis present

## 2022-07-29 HISTORY — DX: Monoclonal gammopathy: D47.2

## 2022-07-29 HISTORY — PX: I & D EXTREMITY: SHX5045

## 2022-07-29 HISTORY — DX: Heart failure, unspecified: I50.9

## 2022-07-29 LAB — GLUCOSE, CAPILLARY
Glucose-Capillary: 101 mg/dL — ABNORMAL HIGH (ref 70–99)
Glucose-Capillary: 112 mg/dL — ABNORMAL HIGH (ref 70–99)
Glucose-Capillary: 120 mg/dL — ABNORMAL HIGH (ref 70–99)
Glucose-Capillary: 122 mg/dL — ABNORMAL HIGH (ref 70–99)
Glucose-Capillary: 189 mg/dL — ABNORMAL HIGH (ref 70–99)
Glucose-Capillary: 136 mg/dL — ABNORMAL HIGH (ref 70–99)

## 2022-07-29 LAB — POCT I-STAT, CHEM 8
BUN: 43 mg/dL — ABNORMAL HIGH (ref 8–23)
Calcium, Ion: 1.18 mmol/L (ref 1.15–1.40)
Chloride: 102 mmol/L (ref 98–111)
Creatinine, Ser: 2.1 mg/dL — ABNORMAL HIGH (ref 0.61–1.24)
Glucose, Bld: 121 mg/dL — ABNORMAL HIGH (ref 70–99)
HCT: 39 % (ref 39.0–52.0)
Hemoglobin: 13.3 g/dL (ref 13.0–17.0)
Potassium: 5 mmol/L (ref 3.5–5.1)
Sodium: 135 mmol/L (ref 135–145)
TCO2: 28 mmol/L (ref 22–32)

## 2022-07-29 LAB — HEMOGLOBIN A1C
Hgb A1c MFr Bld: 7.8 % — ABNORMAL HIGH (ref 4.8–5.6)
Mean Plasma Glucose: 177.16 mg/dL

## 2022-07-29 SURGERY — IRRIGATION AND DEBRIDEMENT EXTREMITY
Anesthesia: Monitor Anesthesia Care | Site: Heel | Laterality: Right

## 2022-07-29 MED ORDER — INSULIN ASPART 100 UNIT/ML IJ SOLN
0.0000 [IU] | Freq: Three times a day (TID) | INTRAMUSCULAR | Status: DC
  Administered 2022-07-29: 7 [IU] via SUBCUTANEOUS
  Administered 2022-07-29 – 2022-07-30 (×2): 2 [IU] via SUBCUTANEOUS
  Administered 2022-07-30: 3 [IU] via SUBCUTANEOUS
  Administered 2022-07-31 (×2): 2 [IU] via SUBCUTANEOUS
  Administered 2022-08-01: 3 [IU] via SUBCUTANEOUS
  Administered 2022-08-01 – 2022-08-02 (×2): 2 [IU] via SUBCUTANEOUS

## 2022-07-29 MED ORDER — ACETAMINOPHEN 325 MG PO TABS: 325.0000 mg | ORAL_TABLET | Freq: Four times a day (QID) | ORAL | Status: DC | PRN

## 2022-07-29 MED ORDER — CHLORHEXIDINE GLUCONATE 0.12 % MT SOLN
15.0000 mL | Freq: Once | OROMUCOSAL | Status: AC
  Administered 2022-07-29: 15 mL via OROMUCOSAL
  Filled 2022-07-29: qty 15

## 2022-07-29 MED ORDER — FUROSEMIDE 40 MG PO TABS
80.0000 mg | ORAL_TABLET | Freq: Every morning | ORAL | Status: DC
  Administered 2022-07-30 – 2022-08-02 (×4): 80 mg via ORAL
  Filled 2022-07-29 (×5): qty 2

## 2022-07-29 MED ORDER — PROPOFOL 10 MG/ML IV BOLUS
INTRAVENOUS | Status: DC | PRN
  Administered 2022-07-29: 120 ug/kg/min via INTRAVENOUS

## 2022-07-29 MED ORDER — FENTANYL CITRATE (PF) 100 MCG/2ML IJ SOLN: 50.0000 ug | Freq: Once | INTRAMUSCULAR | Status: AC

## 2022-07-29 MED ORDER — CLOPIDOGREL BISULFATE 75 MG PO TABS: 75.0000 mg | ORAL_TABLET | Freq: Every day | ORAL | Status: DC

## 2022-07-29 MED ORDER — ONDANSETRON HCL 4 MG/2ML IJ SOLN: 4.0000 mg | Freq: Once | INTRAMUSCULAR | Status: DC | PRN

## 2022-07-29 MED ORDER — ALBUMIN HUMAN 5 % IV SOLN
INTRAVENOUS | Status: AC
  Filled 2022-07-29: qty 250

## 2022-07-29 MED ORDER — FENTANYL CITRATE (PF) 100 MCG/2ML IJ SOLN
INTRAMUSCULAR | Status: AC
  Administered 2022-07-29: 50 ug via INTRAVENOUS
  Filled 2022-07-29: qty 2

## 2022-07-29 MED ORDER — HYDROCODONE-ACETAMINOPHEN 7.5-325 MG PO TABS
1.0000 | ORAL_TABLET | ORAL | Status: DC | PRN
  Administered 2022-07-29: 2 via ORAL
  Administered 2022-07-30: 1 via ORAL
  Administered 2022-07-30 (×2): 2 via ORAL
  Administered 2022-07-31: 1 via ORAL
  Filled 2022-07-29: qty 2
  Filled 2022-07-29: qty 1
  Filled 2022-07-29 (×2): qty 2
  Filled 2022-07-29: qty 1

## 2022-07-29 MED ORDER — MAGNESIUM SULFATE 2 GM/50ML IV SOLN: 2.0000 g | Freq: Every day | INTRAVENOUS | Status: DC | PRN

## 2022-07-29 MED ORDER — ATORVASTATIN CALCIUM 40 MG PO TABS
40.0000 mg | ORAL_TABLET | Freq: Every morning | ORAL | Status: DC
  Administered 2022-07-30 – 2022-08-02 (×4): 40 mg via ORAL
  Filled 2022-07-29 (×4): qty 1

## 2022-07-29 MED ORDER — POTASSIUM CHLORIDE CRYS ER 20 MEQ PO TBCR: 20.0000 meq | EXTENDED_RELEASE_TABLET | Freq: Every day | ORAL | Status: DC | PRN

## 2022-07-29 MED ORDER — FENTANYL CITRATE (PF) 250 MCG/5ML IJ SOLN
INTRAMUSCULAR | Status: AC
  Filled 2022-07-29: qty 5

## 2022-07-29 MED ORDER — MIDAZOLAM HCL 2 MG/2ML IJ SOLN
INTRAMUSCULAR | Status: AC
  Filled 2022-07-29: qty 2

## 2022-07-29 MED ORDER — VANCOMYCIN HCL 1000 MG IV SOLR
INTRAVENOUS | Status: AC
  Filled 2022-07-29: qty 20

## 2022-07-29 MED ORDER — MAGNESIUM CITRATE PO SOLN: 1.0000 | Freq: Once | ORAL | Status: DC | PRN

## 2022-07-29 MED ORDER — HYDRALAZINE HCL 10 MG PO TABS
10.0000 mg | ORAL_TABLET | Freq: Three times a day (TID) | ORAL | Status: DC
  Administered 2022-07-29 – 2022-08-02 (×10): 10 mg via ORAL
  Filled 2022-07-29 (×11): qty 1

## 2022-07-29 MED ORDER — DOCUSATE SODIUM 100 MG PO CAPS
100.0000 mg | ORAL_CAPSULE | Freq: Every day | ORAL | Status: DC
  Administered 2022-07-30 – 2022-08-02 (×4): 100 mg via ORAL
  Filled 2022-07-29 (×4): qty 1

## 2022-07-29 MED ORDER — DEXMEDETOMIDINE HCL IN NACL 80 MCG/20ML IV SOLN
INTRAVENOUS | Status: DC | PRN
  Administered 2022-07-29: 4 ug via INTRAVENOUS
  Administered 2022-07-29: 8 ug via INTRAVENOUS

## 2022-07-29 MED ORDER — CEFAZOLIN SODIUM-DEXTROSE 2-4 GM/100ML-% IV SOLN
2.0000 g | Freq: Three times a day (TID) | INTRAVENOUS | Status: AC
  Administered 2022-07-29 (×2): 2 g via INTRAVENOUS
  Filled 2022-07-29 (×2): qty 100

## 2022-07-29 MED ORDER — ONDANSETRON HCL 4 MG/2ML IJ SOLN
4.0000 mg | Freq: Four times a day (QID) | INTRAMUSCULAR | Status: DC | PRN
  Administered 2022-07-29: 4 mg via INTRAVENOUS
  Filled 2022-07-29: qty 2

## 2022-07-29 MED ORDER — PHENOL 1.4 % MT LIQD: 1.0000 | OROMUCOSAL | Status: DC | PRN

## 2022-07-29 MED ORDER — ALBUMIN HUMAN 5 % IV SOLN
12.5000 g | Freq: Once | INTRAVENOUS | Status: AC
  Administered 2022-07-29: 12.5 g via INTRAVENOUS

## 2022-07-29 MED ORDER — GUAIFENESIN-DM 100-10 MG/5ML PO SYRP: 15.0000 mL | ORAL_SOLUTION | ORAL | Status: DC | PRN

## 2022-07-29 MED ORDER — ONDANSETRON HCL 4 MG/2ML IJ SOLN
INTRAMUSCULAR | Status: DC | PRN
  Administered 2022-07-29: 4 mg via INTRAVENOUS

## 2022-07-29 MED ORDER — LACTATED RINGERS IV SOLN: INTRAVENOUS | Status: DC

## 2022-07-29 MED ORDER — PHENYLEPHRINE HCL-NACL 20-0.9 MG/250ML-% IV SOLN
INTRAVENOUS | Status: DC | PRN
  Administered 2022-07-29: 50 ug/min via INTRAVENOUS

## 2022-07-29 MED ORDER — SODIUM CHLORIDE 0.9 % IV SOLN: INTRAVENOUS | Status: DC

## 2022-07-29 MED ORDER — GABAPENTIN 400 MG PO CAPS
800.0000 mg | ORAL_CAPSULE | Freq: Every evening | ORAL | Status: DC | PRN
  Administered 2022-07-30 – 2022-07-31 (×2): 800 mg via ORAL
  Filled 2022-07-29 (×2): qty 2

## 2022-07-29 MED ORDER — ASPIRIN 81 MG PO TBEC
81.0000 mg | DELAYED_RELEASE_TABLET | Freq: Every morning | ORAL | Status: DC
  Administered 2022-07-30 – 2022-08-02 (×4): 81 mg via ORAL
  Filled 2022-07-29 (×4): qty 1

## 2022-07-29 MED ORDER — ACETAMINOPHEN 10 MG/ML IV SOLN: 1000.0000 mg | Freq: Once | INTRAVENOUS | Status: DC | PRN

## 2022-07-29 MED ORDER — ROPIVACAINE HCL 5 MG/ML IJ SOLN
INTRAMUSCULAR | Status: DC | PRN
  Administered 2022-07-29: 30 mL via PERINEURAL

## 2022-07-29 MED ORDER — 0.9 % SODIUM CHLORIDE (POUR BTL) OPTIME
TOPICAL | Status: DC | PRN
  Administered 2022-07-29: 1000 mL

## 2022-07-29 MED ORDER — PANTOPRAZOLE SODIUM 40 MG PO TBEC
40.0000 mg | DELAYED_RELEASE_TABLET | Freq: Every day | ORAL | Status: DC
  Administered 2022-07-29 – 2022-08-02 (×5): 40 mg via ORAL
  Filled 2022-07-29 (×5): qty 1

## 2022-07-29 MED ORDER — MORPHINE SULFATE (PF) 2 MG/ML IV SOLN
0.5000 mg | INTRAVENOUS | Status: DC | PRN
  Administered 2022-07-30 (×5): 1 mg via INTRAVENOUS
  Administered 2022-07-31: 0.5 mg via INTRAVENOUS
  Administered 2022-07-31 (×2): 1 mg via INTRAVENOUS
  Filled 2022-07-29 (×8): qty 1

## 2022-07-29 MED ORDER — FENTANYL CITRATE (PF) 100 MCG/2ML IJ SOLN: 25.0000 ug | INTRAMUSCULAR | Status: DC | PRN

## 2022-07-29 MED ORDER — FUROSEMIDE 40 MG PO TABS
40.0000 mg | ORAL_TABLET | Freq: Every day | ORAL | Status: DC
  Administered 2022-07-29 – 2022-08-01 (×4): 40 mg via ORAL
  Filled 2022-07-29 (×4): qty 1

## 2022-07-29 MED ORDER — HYDROCODONE-ACETAMINOPHEN 5-325 MG PO TABS: 1.0000 | ORAL_TABLET | ORAL | Status: DC | PRN

## 2022-07-29 MED ORDER — JUVEN PO PACK
1.0000 | PACK | Freq: Two times a day (BID) | ORAL | Status: DC
  Administered 2022-07-29 – 2022-08-02 (×8): 1 via ORAL
  Filled 2022-07-29 (×8): qty 1

## 2022-07-29 MED ORDER — CEFAZOLIN SODIUM-DEXTROSE 2-4 GM/100ML-% IV SOLN
2.0000 g | INTRAVENOUS | Status: DC
Start: 2022-07-29 — End: 2022-07-29

## 2022-07-29 MED ORDER — CEFAZOLIN SODIUM-DEXTROSE 2-4 GM/100ML-% IV SOLN
2.0000 g | INTRAVENOUS | Status: AC
  Administered 2022-07-29: 2 g via INTRAVENOUS
  Filled 2022-07-29: qty 100

## 2022-07-29 MED ORDER — INSULIN ASPART 100 UNIT/ML IJ SOLN
4.0000 [IU] | Freq: Three times a day (TID) | INTRAMUSCULAR | Status: DC
  Administered 2022-07-29 – 2022-08-02 (×12): 4 [IU] via SUBCUTANEOUS

## 2022-07-29 MED ORDER — CLONIDINE HCL (ANALGESIA) 100 MCG/ML EP SOLN
EPIDURAL | Status: DC | PRN
  Administered 2022-07-29: 100 ug

## 2022-07-29 MED ORDER — INSULIN ASPART 100 UNIT/ML IJ SOLN: 0.0000 [IU] | INTRAMUSCULAR | Status: DC | PRN

## 2022-07-29 MED ORDER — ORAL CARE MOUTH RINSE: 15.0000 mL | Freq: Once | OROMUCOSAL | Status: AC

## 2022-07-29 MED ORDER — APIXABAN 5 MG PO TABS: 5.0000 mg | ORAL_TABLET | Freq: Two times a day (BID) | ORAL | Status: DC

## 2022-07-29 MED ORDER — VITAMIN C 500 MG PO TABS
1000.0000 mg | ORAL_TABLET | Freq: Every day | ORAL | Status: DC
  Administered 2022-07-29 – 2022-08-02 (×5): 1000 mg via ORAL
  Filled 2022-07-29 (×5): qty 2

## 2022-07-29 MED ORDER — APIXABAN 5 MG PO TABS
5.0000 mg | ORAL_TABLET | Freq: Two times a day (BID) | ORAL | Status: DC
  Administered 2022-07-30 – 2022-08-02 (×7): 5 mg via ORAL
  Filled 2022-07-29 (×7): qty 1

## 2022-07-29 MED ORDER — ALUM & MAG HYDROXIDE-SIMETH 200-200-20 MG/5ML PO SUSP: 15.0000 mL | ORAL | Status: DC | PRN

## 2022-07-29 MED ORDER — ISOSORBIDE MONONITRATE ER 30 MG PO TB24
30.0000 mg | ORAL_TABLET | Freq: Every day | ORAL | Status: DC
  Administered 2022-07-29 – 2022-08-02 (×5): 30 mg via ORAL
  Filled 2022-07-29 (×5): qty 1

## 2022-07-29 MED ORDER — CARVEDILOL 12.5 MG PO TABS
12.5000 mg | ORAL_TABLET | Freq: Two times a day (BID) | ORAL | Status: DC
  Administered 2022-07-29 – 2022-08-02 (×8): 12.5 mg via ORAL
  Filled 2022-07-29 (×8): qty 1

## 2022-07-29 MED ORDER — ZINC SULFATE 220 (50 ZN) MG PO CAPS
220.0000 mg | ORAL_CAPSULE | Freq: Every day | ORAL | Status: DC
  Administered 2022-07-29 – 2022-08-02 (×5): 220 mg via ORAL
  Filled 2022-07-29 (×5): qty 1

## 2022-07-29 MED ORDER — METOPROLOL TARTRATE 5 MG/5ML IV SOLN: 2.0000 mg | INTRAVENOUS | Status: DC | PRN

## 2022-07-29 MED ORDER — HYDRALAZINE HCL 20 MG/ML IJ SOLN: 5.0000 mg | INTRAMUSCULAR | Status: DC | PRN

## 2022-07-29 MED ORDER — BISACODYL 5 MG PO TBEC: 5.0000 mg | DELAYED_RELEASE_TABLET | Freq: Every day | ORAL | Status: DC | PRN

## 2022-07-29 MED ORDER — LABETALOL HCL 5 MG/ML IV SOLN: 10.0000 mg | INTRAVENOUS | Status: DC | PRN

## 2022-07-29 MED ORDER — POLYETHYLENE GLYCOL 3350 17 G PO PACK: 17.0000 g | PACK | Freq: Every day | ORAL | Status: DC | PRN

## 2022-07-29 SURGICAL SUPPLY — 37 items
BAG COUNTER SPONGE SURGICOUNT (BAG) IMPLANT
BAG SPNG CNTER NS LX DISP (BAG)
BLADE SAGITTAL (BLADE) ×1
BLADE SAW THK.89X75X18XSGTL (BLADE) IMPLANT
BLADE SURG 21 STRL SS (BLADE) ×2 IMPLANT
BNDG CMPR 5X6 CHSV STRCH STRL (GAUZE/BANDAGES/DRESSINGS)
BNDG COHESIVE 6X5 TAN ST LF (GAUZE/BANDAGES/DRESSINGS) IMPLANT
BNDG GAUZE DERMACEA FLUFF 4 (GAUZE/BANDAGES/DRESSINGS) ×4 IMPLANT
BNDG GZE DERMACEA 4 6PLY (GAUZE/BANDAGES/DRESSINGS)
COVER SURGICAL LIGHT HANDLE (MISCELLANEOUS) ×4 IMPLANT
DRAPE U-SHAPE 47X51 STRL (DRAPES) ×2 IMPLANT
DRSG ADAPTIC 3X8 NADH LF (GAUZE/BANDAGES/DRESSINGS) ×2 IMPLANT
DURAPREP 26ML APPLICATOR (WOUND CARE) ×2 IMPLANT
ELECT REM PT RETURN 9FT ADLT (ELECTROSURGICAL)
ELECTRODE REM PT RTRN 9FT ADLT (ELECTROSURGICAL) IMPLANT
GAUZE SPONGE 4X4 12PLY STRL (GAUZE/BANDAGES/DRESSINGS) ×2 IMPLANT
GLOVE BIOGEL PI IND STRL 9 (GLOVE) ×2 IMPLANT
GLOVE SURG ORTHO 9.0 STRL STRW (GLOVE) ×2 IMPLANT
GOWN STRL REUS W/ TWL XL LVL3 (GOWN DISPOSABLE) ×4 IMPLANT
GOWN STRL REUS W/TWL XL LVL3 (GOWN DISPOSABLE) ×1
GRAFT SKIN WND MICRO 38 (Tissue) IMPLANT
GRAFT SKIN WND OMEGA3 SB 7X10 (Tissue) IMPLANT
HANDPIECE INTERPULSE COAX TIP (DISPOSABLE)
KIT BASIN OR (CUSTOM PROCEDURE TRAY) ×2 IMPLANT
KIT TURNOVER KIT B (KITS) ×2 IMPLANT
MANIFOLD NEPTUNE II (INSTRUMENTS) ×2 IMPLANT
NS IRRIG 1000ML POUR BTL (IV SOLUTION) ×2 IMPLANT
PACK ORTHO EXTREMITY (CUSTOM PROCEDURE TRAY) ×2 IMPLANT
PAD ARMBOARD 7.5X6 YLW CONV (MISCELLANEOUS) ×4 IMPLANT
SET HNDPC FAN SPRY TIP SCT (DISPOSABLE) IMPLANT
STOCKINETTE IMPERVIOUS 9X36 MD (GAUZE/BANDAGES/DRESSINGS) IMPLANT
SUT ETHILON 2 0 PSLX (SUTURE) ×2 IMPLANT
SWAB COLLECTION DEVICE MRSA (MISCELLANEOUS) ×2 IMPLANT
SWAB CULTURE ESWAB REG 1ML (MISCELLANEOUS) IMPLANT
TOWEL GREEN STERILE (TOWEL DISPOSABLE) ×2 IMPLANT
TUBE CONNECTING 12X1/4 (SUCTIONS) ×2 IMPLANT
YANKAUER SUCT BULB TIP NO VENT (SUCTIONS) ×2 IMPLANT

## 2022-07-29 NOTE — Telephone Encounter (Signed)
Pharmacy Patient Advocate Encounter  Insurance verification completed.    The patient is insured through [employer] Medicare Part D   The patient is currently admitted and ran test claims for the following: Eliquis, Xarelto.  Copays and coinsurance results were relayed to Inpatient clinical team.      

## 2022-07-29 NOTE — TOC Benefit Eligibility Note (Signed)
Patient Product/process development scientist completed.    The patient is currently admitted and upon discharge could be taking Eliquis 5 mg.  Product Not Covered Disease contraindication "Mechanical Heart Valve Present"   The patient is currently admitted and upon discharge could be taking Xarelto 20 mg.  Product Not Covered Disease contraindication "Mechanical Heart Valve Present"   The patient is insured through [employer] Medicare Part D   This test claim was processed through [facility]- copay amounts may vary at other pharmacies due to pharmacy/plan contracts, or as the patient moves through the different stages of their insurance plan.  Roland Earl, CPHT Pharmacy Patient Advocate Specialist [facility] Patient Advocate Team Direct Number: [phone]  Fax: [phone]

## 2022-07-29 NOTE — Progress Notes (Signed)
pt is bleeding from [facility] area had to place chux under his foot reported to Dr Lajoyce Corners told to reinforce explained would use tegaderm kerlix and ABD pads until evaluated.

## 2022-07-29 NOTE — Interval H&P Note (Signed)
History and Physical Interval Note:  07/29/2022 6:31 AM  Caleb [name]  has presented today for surgery, with the diagnosis of Dehiscence Right Foot Wound.  The various methods of treatment have been discussed with the patient and family. After consideration of risks, benefits and other options for treatment, the patient has consented to  Procedure(s): RIGHT PARTIAL CALCANEUS EXCISION (Right) as a surgical intervention.  The patient's history has been reviewed, patient examined, no change in status, stable for surgery.  I have reviewed the patient's chart and labs.  Questions were answered to the patient's satisfaction.     Nadara Mustard

## 2022-07-29 NOTE — Anesthesia Postprocedure Evaluation (Signed)
Anesthesia Post Note  Patient: Caleb [name]  Procedure(s) Performed: RIGHT PARTIAL CALCANEUS EXCISION (Right: Heel)     Patient location during evaluation: PACU Anesthesia Type: Regional Level of consciousness: awake and alert Pain management: pain level controlled Vital Signs Assessment: post-procedure vital signs reviewed and stable Respiratory status: spontaneous breathing, nonlabored ventilation, respiratory function stable and patient connected to nasal cannula oxygen Cardiovascular status: stable and blood pressure returned to baseline Postop Assessment: no apparent nausea or vomiting Anesthetic complications: no  No notable events documented.  Last Vitals:  Vitals:   07/29/22 1030 07/29/22 1045  BP: 101/70 109/69  Pulse: 64 66  Resp: 14 16  Temp:    SpO2: 99% 100%    Last Pain:  Vitals:   07/29/22 1030  TempSrc:   PainSc: 0-No pain                 Trevor Iha

## 2022-07-29 NOTE — Transfer of Care (Signed)
Immediate Anesthesia Transfer of Care Note  Patient: Caleb [name]  Procedure(s) Performed: RIGHT PARTIAL CALCANEUS EXCISION (Right: Heel)  Patient Location: PACU  Anesthesia Type:MAC  Level of Consciousness: awake  Airway & Oxygen Therapy: Patient Spontanous Breathing  Post-op Assessment: Report given to RN and Post -op Vital signs reviewed and stable  Post vital signs: Reviewed and stable  Last Vitals:  Vitals Value Taken Time  BP 92/61 07/29/22 0954  Temp    Pulse 71 07/29/22 0955  Resp 16 07/29/22 0955  SpO2 98 % 07/29/22 0955  Vitals shown include unvalidated device data.  Last Pain:  Vitals:   07/29/22 0847  TempSrc:   PainSc: 0-No pain      Patients Stated Pain Goal: 0 (07/29/22 0847)  Complications: No notable events documented.

## 2022-07-29 NOTE — Anesthesia Procedure Notes (Signed)
Anesthesia Regional Block: Popliteal block   Pre-Anesthetic Checklist: , timeout performed,  Correct Patient, Correct Site, Correct Laterality,  Correct Procedure, Correct Position, site marked,  Risks and benefits discussed,  Pre-op evaluation,  At surgeon's request and post-op pain management  Laterality: Lower  Prep: Maximum Sterile Barrier Precautions used, chloraprep       Needles:  Injection technique: Single-shot  Needle Type: Echogenic Needle     Needle Length: 9cm  Needle Gauge: 21     Additional Needles:   Procedures:,,,, ultrasound used (permanent image in chart),,    Narrative:  Start time: 07/29/2022 8:08 AM End time: 07/29/2022 8:33 AM Injection made incrementally with aspirations every 5 mL.  Performed by: Personally  Anesthesiologist: Trevor Iha, MD  Additional Notes: Block assessed. Patient tolerated procedure well.

## 2022-07-29 NOTE — Progress Notes (Signed)
Orthopedic Tech Progress Note Patient Details:  Caleb [name] [birth year] [id number] Left PRAFO at bedside due to foot bleeding. RN aware Ortho Devices Type of Ortho Device: Prafo boot/shoe Ortho Device/Splint Location: RLE Ortho Device/Splint Interventions: Ordered      [name] A Jena Tegeler 07/29/2022, 5:27 PM

## 2022-07-29 NOTE — Op Note (Signed)
07/29/2022  2:40 PM  PATIENT:  Caleb [name]    PRE-OPERATIVE DIAGNOSIS:  Dehiscence Right Foot Wound  POST-OPERATIVE DIAGNOSIS:  Same  PROCEDURE:  RIGHT PARTIAL CALCANEUS EXCISION and excision of skin and soft tissue and tendon. Application of Kerecis micro graft 38 cm and a Kerecis 7 x 10 cm sheet. Application of a new size large KCI wound VAC.   SURGEON:  Nadara Mustard, MD  PHYSICIAN ASSISTANT:None ANESTHESIA:   General  PREOPERATIVE INDICATIONS:  Caleb [name] is a  72 y.o. male with a diagnosis of Dehiscence Right Foot Wound who failed conservative measures and elected for surgical management.    The risks benefits and alternatives were discussed with the patient preoperatively including but not limited to the risks of infection, bleeding, nerve injury, cardiopulmonary complications, the need for revision surgery, among others, and the patient was willing to proceed.  OPERATIVE IMPLANTS:   Implant Name Type Inv. Item Serial No. Manufacturer Lot No. LRB No. Used Action  GRAFT SKIN WND [id number] SB 7X10 - [id number] Tissue GRAFT SKIN WND OMEGA3 SB 7X10  [employer] [id number] Right 1 Implanted  GRAFT SKIN WND MICRO 38 - [id number] Tissue GRAFT SKIN WND MICRO 38  [employer] [id number] Right 1 Implanted    @ENCIMAGES @  OPERATIVE FINDINGS: Patient had good petechial bleeding in the bone and soft tissue.  OPERATIVE PROCEDURE: Patient brought the operating room and underwent a regional anesthetic.  After adequate levels anesthesia obtained patient's right lower extremity was prepped using DuraPrep draped into a sterile field a timeout was called.  An elliptical incision was made around the wound dehiscence.  A oscillating saw was used to resect further calcaneus.  Tissue margins were clear the wound was irrigated with normal saline electrocautery was used hemostasis.  The wound measures 13 x 5 cm x 2 cm deep after debridement.  Kerecis micro graft was applied to the wound bed  this was covered with a 7 x 10 sheet this was secured with staples and the new KCI wound VAC was applied this had a good suction fit patient was taken the PACU in stable condition.   DISCHARGE PLANNING:  Antibiotic duration: Continue IV antibiotics adjust according to tissue cultures.  Weightbearing: Touchdown weightbearing on the right  Pain medication: Opioid pathway  Dressing care/ Wound VAC: Wound VAC  Ambulatory devices: Walker  Discharge to: Discharge planning based on therapy recommendations  Follow-up: In the office 1 week post operative.

## 2022-07-30 ENCOUNTER — Encounter (HOSPITAL_COMMUNITY): Payer: Self-pay | Admitting: Orthopedic Surgery

## 2022-07-30 LAB — CBC
HCT: 25.9 % — ABNORMAL LOW (ref 39.0–52.0)
Hemoglobin: 8.3 g/dL — ABNORMAL LOW (ref 13.0–17.0)
MCH: 26.2 pg (ref 26.0–34.0)
MCHC: 32 g/dL (ref 30.0–36.0)
MCV: 81.7 fL (ref 80.0–100.0)
Platelets: 268 10*3/uL (ref 150–400)
RBC: 3.17 MIL/uL — ABNORMAL LOW (ref 4.22–5.81)
RDW: 16.7 % — ABNORMAL HIGH (ref 11.5–15.5)
WBC: 8.9 10*3/uL (ref 4.0–10.5)
nRBC: 0 % (ref 0.0–0.2)

## 2022-07-30 LAB — BASIC METABOLIC PANEL
Anion gap: 11 (ref 5–15)
BUN: 41 mg/dL — ABNORMAL HIGH (ref 8–23)
CO2: 23 mmol/L (ref 22–32)
Calcium: 8 mg/dL — ABNORMAL LOW (ref 8.9–10.3)
Chloride: 95 mmol/L — ABNORMAL LOW (ref 98–111)
Creatinine, Ser: 1.92 mg/dL — ABNORMAL HIGH (ref 0.61–1.24)
GFR, Estimated: 37 mL/min — ABNORMAL LOW (ref >60)
Glucose, Bld: 126 mg/dL — ABNORMAL HIGH (ref 70–99)
Potassium: 4.9 mmol/L (ref 3.5–5.1)
Sodium: 129 mmol/L — ABNORMAL LOW (ref 135–145)

## 2022-07-30 LAB — GLUCOSE, CAPILLARY
Glucose-Capillary: 139 mg/dL — ABNORMAL HIGH (ref 70–99)
Glucose-Capillary: 147 mg/dL — ABNORMAL HIGH (ref 70–99)
Glucose-Capillary: 199 mg/dL — ABNORMAL HIGH (ref 70–99)
Glucose-Capillary: 166 mg/dL — ABNORMAL HIGH (ref 70–99)

## 2022-07-30 NOTE — [place] FL2 (Signed)
[place] MEDICAID [id number] LEVEL OF CARE FORM     IDENTIFICATION  Patient Name: Caleb [name] Birthdate: 20-Jun-1950 Sex: male Admission Date (Current Location): 07/29/2022  [facility] and [place] Number:  Producer, television/film/video and Address:  [facility] [place], [address] [place], [place] [address]      Provider Number: [id number]  Attending Physician Name and Address:  Nadara Mustard, MD  Relative Name and Phone Number:       Current Level of Care: Hospital Recommended Level of Care: Skilled Nursing Facility Prior Approval Number:    Date Approved/Denied:   PASRR Number: [id number]  Discharge Plan: SNF    Current Diagnoses: Patient Active Problem List   Diagnosis Date Noted   Foot ulcer with necrosis of bone (HCC) 07/29/2022   Gastroenteritis 06/15/2022   Acute osteomyelitis of right calcaneus (HCC) 06/09/2022   Diabetic foot ulcer (HCC) 06/09/2022   SIRS (systemic inflammatory response syndrome) (HCC) 06/07/2022   Acute CHF (congestive heart failure) (HCC) 05/27/2022   Decubitus ulcer of right heel 05/27/2022   Nausea & vomiting 05/27/2022   Wild-type transthyretin-related (ATTR) amyloidosis (HCC) 04/26/2022   MGUS (monoclonal gammopathy of unknown significance) 02/15/2022   PAD (peripheral artery disease) (HCC) 01/11/2022   Non-ischemic cardiomyopathy (HCC) 01/11/2022   Stage III chronic kidney disease (HCC) 01/11/2022   Severe aortic stenosis 01/11/2022   S/P TAVR (transcatheter aortic valve replacement) 01/11/2022   Caries 11/12/2021   Teeth missing 11/12/2021   Retained tooth root 11/12/2021   Chronic apical periodontitis 11/12/2021   Chronic periodontitis 11/12/2021   Accretions on teeth 11/12/2021   Long term (current) use of antithrombotics/antiplatelets 11/12/2021   Phobia of dental procedure 11/12/2021   Defective dental restoration 11/12/2021   Attrition, teeth excessive 11/12/2021   Torus mandibularis 11/12/2021   Diastema of  teeth 11/12/2021   Encounter for preoperative dental examination 11/09/2021   Acute kidney injury superimposed on chronic kidney disease (HCC) 10/30/2021   Acute on chronic systolic CHF (congestive heart failure) (HCC) 10/28/2021   Hypertensive urgency 10/28/2021   Type 2 diabetes mellitus with hyperlipidemia (HCC) 07/01/2021   Burn of first degree of multiple sites of unspecified wrist and hand, initial encounter 12/31/2020   Male erectile disorder (CODE) 12/18/2020   Encounter for sterilization 12/18/2020   Diabetic neuropathy (HCC) 09/11/2020   Low back pain, unspecified 09/11/2020   Unspecified kidney failure 09/11/2020   Unsteadiness on feet 09/11/2020   Status post amputation of lesser toe of left foot (HCC) 07/22/2020   Hypertensive heart disease with chronic combined systolic and diastolic congestive heart failure (HCC) 07/22/2020   Carotid stenosis, asymptomatic 02/03/2020   Peripheral vascular disease (HCC)    Carotid artery stenosis 12/16/2019   Moderate aortic stenosis 12/02/2019   Critical ischemia of foot (HCC) 11/18/2019   Carotid artery disease (HCC) 08/23/2019   Critical limb ischemia with history of revascularization of same extremity (HCC) 08/02/2019   Diabetes mellitus without complication (HCC)    Cardiac murmur 02/22/2018   Vitamin D deficiency 06/23/2015   Pancreatitis, acute 09/27/2012   Dyslipidemia 09/27/2012   Erectile dysfunction 09/27/2012   Unspecified essential hypertension 09/27/2012   Obesity, unspecified 08/14/2012   Hyponatremia 08/14/2012   Acute pancreatitis 08/12/2012   HTN (hypertension) 08/12/2012   Hyperlipidemia 08/12/2012   Metabolic acidosis 08/12/2012    Orientation RESPIRATION BLADDER Height & Weight     Self, Time, Situation, Place  Normal Continent Weight: 161 lb (73 kg) Height:  5\' 7"  (170.2 cm)  BEHAVIORAL SYMPTOMS/MOOD NEUROLOGICAL BOWEL NUTRITION STATUS      Continent Diet (See dc summary)  AMBULATORY STATUS COMMUNICATION  OF NEEDS Skin   Extensive Assist Verbally Normal                       Personal Care Assistance Level of Assistance  Bathing, Feeding, Dressing Bathing Assistance: Limited assistance Feeding assistance: Independent Dressing Assistance: Limited assistance     Functional Limitations Info  Sight, Hearing, Speech Sight Info: Impaired Hearing Info: Adequate Speech Info: Adequate    SPECIAL CARE FACTORS FREQUENCY  PT (By licensed PT), OT (By licensed OT)     PT Frequency: 5xweek OT Frequency: 5xweek            Contractures Contractures Info: Not present    Additional Factors Info  Code Status, Allergies Code Status Info: Full Allergies Info: Codeine  Norco (Hydrocodone-acetaminophen)  Percocet (Oxycodone-acetaminophen)  Tramadol Hcl           Current Medications (07/30/2022):  This is the current hospital active medication list Current Facility-Administered Medications  Medication Dose Route Frequency Provider Last Rate Last Admin   0.9 %  sodium chloride infusion   Intravenous Continuous Nadara Mustard, MD 75 mL/hr at 07/29/22 1259 New Bag at 07/29/22 1259   acetaminophen (TYLENOL) tablet 325-650 mg  325-650 mg Oral Q6H PRN Nadara Mustard, MD       alum & mag hydroxide-simeth (MAALOX/MYLANTA) 200-200-20 MG/5ML suspension 15-30 mL  15-30 mL Oral Q2H PRN Nadara Mustard, MD       apixaban Everlene Balls) tablet 5 mg  5 mg Oral BID Nadara Mustard, MD   5 mg at 07/30/22 1610   ascorbic acid (VITAMIN C) tablet 1,000 mg  1,000 mg Oral Daily Nadara Mustard, MD   1,000 mg at 07/30/22 9604   aspirin EC tablet 81 mg  81 mg Oral q AM Nadara Mustard, MD   81 mg at 07/30/22 0814   atorvastatin (LIPITOR) tablet 40 mg  40 mg Oral q AM Nadara Mustard, MD   40 mg at 07/30/22 0815   bisacodyl (DULCOLAX) EC tablet 5 mg  5 mg Oral Daily PRN Nadara Mustard, MD       carvedilol (COREG) tablet 12.5 mg  12.5 mg Oral BID WC Nadara Mustard, MD   12.5 mg at 07/30/22 0815   docusate sodium (COLACE)  capsule 100 mg  100 mg Oral Daily Nadara Mustard, MD   100 mg at 07/30/22 0814   furosemide (LASIX) tablet 40 mg  40 mg Oral q1600 Nadara Mustard, MD   40 mg at 07/29/22 1745   furosemide (LASIX) tablet 80 mg  80 mg Oral q AM Nadara Mustard, MD   80 mg at 07/30/22 0815   gabapentin (NEURONTIN) capsule 800 mg  800 mg Oral QHS PRN Nadara Mustard, MD       guaiFENesin-dextromethorphan (ROBITUSSIN DM) 100-10 MG/5ML syrup 15 mL  15 mL Oral Q4H PRN Nadara Mustard, MD       hydrALAZINE (APRESOLINE) injection 5 mg  5 mg Intravenous Q20 Min PRN Nadara Mustard, MD       hydrALAZINE (APRESOLINE) tablet 10 mg  10 mg Oral TID Nadara Mustard, MD   10 mg at 07/30/22 0815   HYDROcodone-acetaminophen (NORCO) 7.5-325 MG per tablet 1-2 tablet  1-2 tablet Oral Q4H PRN Nadara Mustard, MD   2 tablet at 07/30/22 1351  HYDROcodone-acetaminophen (NORCO/VICODIN) 5-325 MG per tablet 1-2 tablet  1-2 tablet Oral Q4H PRN Nadara Mustard, MD       insulin aspart (novoLOG) injection 0-15 Units  0-15 Units Subcutaneous TID WC Nadara Mustard, MD   2 Units at 07/30/22 1346   insulin aspart (novoLOG) injection 4 Units  4 Units Subcutaneous TID WC Nadara Mustard, MD   4 Units at 07/30/22 1345   isosorbide mononitrate (IMDUR) 24 hr tablet 30 mg  30 mg Oral Daily Nadara Mustard, MD   30 mg at 07/30/22 0817   labetalol (NORMODYNE) injection 10 mg  10 mg Intravenous Q10 min PRN Nadara Mustard, MD       magnesium citrate solution 1 Bottle  1 Bottle Oral Once PRN Nadara Mustard, MD       magnesium sulfate IVPB 2 g 50 mL  2 g Intravenous Daily PRN Nadara Mustard, MD       metoprolol tartrate (LOPRESSOR) injection 2-5 mg  2-5 mg Intravenous Q2H PRN Nadara Mustard, MD       morphine (PF) 2 MG/ML injection 0.5-1 mg  0.5-1 mg Intravenous Q2H PRN Nadara Mustard, MD   1 mg at 07/30/22 1119   nutrition supplement (JUVEN) (JUVEN) powder packet 1 packet  1 packet Oral BID BM Nadara Mustard, MD   1 packet at 07/30/22 1350   ondansetron (ZOFRAN)  injection 4 mg  4 mg Intravenous Q6H PRN Nadara Mustard, MD   4 mg at 07/29/22 2206   pantoprazole (PROTONIX) EC tablet 40 mg  40 mg Oral Daily Nadara Mustard, MD   40 mg at 07/30/22 0813   phenol (CHLORASEPTIC) mouth spray 1 spray  1 spray Mouth/Throat PRN Nadara Mustard, MD       polyethylene glycol (MIRALAX / GLYCOLAX) packet 17 g  17 g Oral Daily PRN Nadara Mustard, MD       potassium chloride SA (KLOR-CON M) CR tablet 20-40 mEq  20-40 mEq Oral Daily PRN Nadara Mustard, MD       zinc sulfate capsule 220 mg  220 mg Oral Daily Nadara Mustard, MD   220 mg at 07/30/22 1610     Discharge Medications: Please see discharge summary for a list of discharge medications.  Relevant Imaging Results:  Relevant Lab Results:   Additional Information SSN: [id number]  Oletta Lamas, MSW, [facility] Transitions of Care  Clinical Social Worker I

## 2022-07-30 NOTE — Progress Notes (Signed)
Inpatient Rehab Admissions Coordinator:   Per therapy recommendations, patient was screened for CIR candidacy by Megan Salon, MS, CCC-SLP. At this time, Pt. does not appear to demonstrate medical necessity to justify in hospital rehabilitation/CIR and I do not think payor would approve CIR for this diagnosis. Additionally, Pt. Was concerned that his copay days for SNF were too high, but copays are typically higher for CIR than for SNF with this payor, so coming to CIR would not address patients concern ever cost.  will not pursue a rehab consult for this Pt.   Recommend other rehab venues to be pursued.  Please contact me with any questions.  Megan Salon, MS, CCC-SLP Rehab Admissions Coordinator  [phone] (celll) [phone] (office)

## 2022-07-30 NOTE — Evaluation (Addendum)
Physical Therapy Evaluation Patient Details Name: Caleb [name] MRN: [id number] DOB: 1951-01-09 Today's Date: 07/30/2022  History of Present Illness  Pt is 72 yo male who presents on 07/29/22 for R calcaneal I&D. PMH: CHF, HTN, HLD, DM2, GERD, PVD, pancreatitis, s/p TAVR  Clinical Impression  Pt admitted with above diagnosis. Pt from home alone but has a cousin he could stay with. Plan is to move to [place] where his daughter lives after his recovery. Pt mobilizing OOB at min A level with RW. Patient will benefit from continued inpatient follow up therapy, >3 hours/day. Pt currently with functional limitations due to the deficits listed below (see PT Problem List). Pt will benefit from acute skilled PT to increase their independence and safety with mobility to allow discharge.          Recommendations for follow up therapy are one component of a multi-disciplinary discharge planning process, led by the attending physician.  Recommendations may be updated based on patient status, additional functional criteria and insurance authorization.  Follow Up Recommendations Can patient physically be transported by private vehicle: Yes     Assistance Recommended at Discharge Intermittent Supervision/Assistance  Patient can return home with the following  A little help with walking and/or transfers;A little help with bathing/dressing/bathroom;Assistance with cooking/housework;Assist for transportation;Help with stairs or ramp for entrance    Equipment Recommendations Rolling walker (2 wheels)  Recommendations for Other Services       Functional Status Assessment Patient has had a recent decline in their functional status and demonstrates the ability to make significant improvements in function in a reasonable and predictable amount of time.     Precautions / Restrictions Precautions Precautions: None Required Braces or Orthoses: Other Brace Other Brace: PRAFO R foot Restrictions Weight Bearing  Restrictions: Yes RLE Weight Bearing: Non weight bearing Other Position/Activity Restrictions: order set states NWB but op note states TTWB so went with the more conservative status of NWB but did allow pt to place R foot on floor in PRAFO      Mobility  Bed Mobility Overal bed mobility: Modified Independent             General bed mobility comments: pt able to come safely to EOB without assist    Transfers Overall transfer level: Needs assistance Equipment used: Rolling walker (2 wheels) Transfers: Sit to/from Stand, Bed to chair/wheelchair/BSC Sit to Stand: Min guard   Step pivot transfers: Min guard       General transfer comment: vc's for hand placement, min guard for safety    Ambulation/Gait Ambulation/Gait assistance: Min assist Gait Distance (Feet): 2 Feet Assistive device: Rolling walker (2 wheels) Gait Pattern/deviations: Step-to pattern Gait velocity: decreased Gait velocity interpretation: <1.31 ft/sec, indicative of household ambulator   General Gait Details: 3 steps fwd and bkwd working on keeping RLE NWB and effectively using Materials engineer    Modified Rankin (Stroke Patients Only)       Balance Overall balance assessment: Mild deficits observed, not formally tested                                           Pertinent Vitals/Pain Pain Assessment Pain Assessment: 0-10 Pain Score: 4  Pain Location: R foot Pain Descriptors / Indicators: Sore Pain Intervention(s): Limited activity within patient's tolerance, Monitored during session  Home Living Family/patient expects to be discharged to:: Private residence Living Arrangements: Alone Available Help at Discharge: Family;Available PRN/intermittently Type of Home: House Home Access: Level entry       Home Layout: One level Home Equipment: Crutches;Cane - single point;Wheelchair - manual Additional Comments: could stay with cousin but  she has stairs to enter her home. She could potentially come stay with him but he reports he does not live close to her work    Prior Function Prior Level of Function : Independent/Modified Independent;Driving             Mobility Comments: independent ADLs Comments: independent     Hand Dominance   Dominant Hand: Left    Extremity/Trunk Assessment   Upper Extremity Assessment Upper Extremity Assessment: Defer to OT evaluation (neuropathy B hands)    Lower Extremity Assessment Lower Extremity Assessment: RLE deficits/detail;LLE deficits/detail RLE Deficits / Details: minimal mvmt of toes, hip and knee ROM and strength WFL but testing limited due to NWB RLE Sensation: history of peripheral neuropathy;decreased light touch;decreased proprioception RLE Coordination: decreased gross motor LLE Deficits / Details: WFL LLE Sensation: history of peripheral neuropathy LLE Coordination: WNL    Cervical / Trunk Assessment Cervical / Trunk Assessment: Normal  Communication   Communication: No difficulties  Cognition Arousal/Alertness: Awake/alert Behavior During Therapy: WFL for tasks assessed/performed Overall Cognitive Status: Within Functional Limits for tasks assessed                                          General Comments General comments (skin integrity, edema, etc.): reviewed elevation of RLE as well as removing PRAFO for ROM of R ankle    Exercises     Assessment/Plan    PT Assessment Patient needs continued PT services  PT Problem List Decreased mobility;Decreased knowledge of precautions;Decreased knowledge of use of DME;Pain;Impaired sensation;Decreased skin integrity       PT Treatment Interventions DME instruction;Gait training;Functional mobility training;Therapeutic activities;Therapeutic exercise;Balance training;Patient/family education    PT Goals (Current goals can be found in the Care Plan section)  Acute Rehab PT Goals Patient  Stated Goal: move to [place] to live near his daughter PT Goal Formulation: With patient Time For Goal Achievement: 08/13/22 Potential to Achieve Goals: Good    Frequency Min 3X/week     Co-evaluation               AM-PAC PT "6 Clicks" Mobility  Outcome Measure Help needed turning from your back to your side while in a flat bed without using bedrails?: None Help needed moving from lying on your back to sitting on the side of a flat bed without using bedrails?: None Help needed moving to and from a bed to a chair (including a wheelchair)?: A Little Help needed standing up from a chair using your arms (e.g., wheelchair or bedside chair)?: A Little Help needed to walk in hospital room?: A Little Help needed climbing 3-5 steps with a railing? : Total 6 Click Score: 18    End of Session Equipment Utilized During Treatment: Gait belt Activity Tolerance: Patient tolerated treatment well Patient left: in chair;with call bell/phone within reach;with chair alarm set Nurse Communication: Mobility status PT Visit Diagnosis: Difficulty in walking, not elsewhere classified (R26.2);Pain Pain - Right/Left: Right Pain - part of body: Ankle and joints of foot    Time: 6213-0865 PT Time Calculation (min) (ACUTE ONLY): 34  min   Charges:   PT Evaluation $PT Eval Moderate Complexity: 1 Mod PT Treatments $Therapeutic Activity: 8-22 mins        Lyanne Co, PT  Acute Rehab Services Secure chat preferred Office [phone]   Lawana Chambers Alif Petrak 07/30/2022, 1:18 PM

## 2022-07-30 NOTE — Progress Notes (Signed)
PT Cancellation Note  Patient Details Name: Caleb [name] MRN: [id number] DOB: 1950/08/23   Cancelled Treatment:    Reason Eval/Treat Not Completed: Pain limiting ability to participate. Will check back on pt at later time.   Lyanne Co, PT  Acute Rehab Services Secure chat preferred Office [phone]    Elyse Hsu 07/30/2022, 8:29 AM

## 2022-07-30 NOTE — TOC Initial Note (Signed)
Transition of Care [facility]) - Initial/Assessment Note    Patient Details  Name: Caleb [name] MRN: [id number] Date of Birth: [birth year]  Transition of [facility]) CM/SW Contact:    Leander Rams, LCSW Phone Number: 07/30/2022, 3:13 PM  Clinical Narrative:                 CSW spoke to pt regarding recommendation for STR at SNF. Pt was at [facility] in April at [facility] and stated he finished his days and couldn't afford the out of pocket cost he'd need to pay to continue rehab. Pt reports he spoke with MD and he feels he could possibly do CIR. CSW reached out to PT to see if pt could be a possible candidate. PT updated their recommendation to CIR. Pt reported that he'd like to do rehab for just a couple of days before returning home.   CSW did complete fl2 and faxed out. TOC will continue to follow.   Expected Discharge Plan: Skilled Nursing Facility Barriers to Discharge: Continued Medical Work up   Patient Goals and CMS Choice            Expected Discharge Plan and Services       Living arrangements for the past 2 months: Apartment                                      Prior Living Arrangements/Services Living arrangements for the past 2 months: Apartment Lives with:: Self Patient language and need for interpreter reviewed:: Yes Do you feel safe going back to the place where you live?: Yes      Need for Family Participation in Patient Care: Yes (Comment) Care giver support system in place?: Yes (comment) Current home services: DME Criminal Activity/Legal Involvement Pertinent to Current Situation/Hospitalization: No - Comment as needed  Activities of Daily Living Home Assistive Devices/Equipment: Walker (specify type), Wheelchair ADL Screening (condition at time of admission) Patient's cognitive ability adequate to safely complete daily activities?: Yes Is the patient deaf or have difficulty hearing?: No Does the patient have difficulty seeing, even when wearing  glasses/contacts?: No Does the patient have difficulty concentrating, remembering, or making decisions?: No Patient able to express need for assistance with ADLs?: Yes Does the patient have difficulty dressing or bathing?: No Independently performs ADLs?: No Communication: Needs assistance Is this a change from baseline?: Change from baseline, expected to last >3 days Dressing (OT): Needs assistance Is this a change from baseline?: Change from baseline, expected to last >3 days Grooming: Independent Feeding: Independent Bathing: Needs assistance Is this a change from baseline?: Change from baseline, expected to last >3 days Toileting: Needs assistance Is this a change from baseline?: Change from baseline, expected to last >3days In/Out Bed: Needs assistance Is this a change from baseline?: Change from baseline, expected to last >3 days Walks in Home: Needs assistance Is this a change from baseline?: Change from baseline, expected to last >3 days Does the patient have difficulty walking or climbing stairs?: Yes Weakness of Legs: Right Weakness of Arms/Hands: None  Permission Sought/Granted                  Emotional Assessment Appearance:: Appears stated age Attitude/Demeanor/Rapport: Engaged Affect (typically observed): Calm, Accepting Orientation: : Oriented to Self, Oriented to Place, Oriented to  Time, Oriented to Situation Alcohol / Substance Use: Not Applicable Psych Involvement: No (comment)  Admission diagnosis:  Foot ulcer with necrosis of bone (HCC) [L97.504] Patient Active Problem List   Diagnosis Date Noted   Foot ulcer with necrosis of bone (HCC) 07/29/2022   Gastroenteritis 06/15/2022   Acute osteomyelitis of right calcaneus (HCC) 06/09/2022   Diabetic foot ulcer (HCC) 06/09/2022   SIRS (systemic inflammatory response syndrome) (HCC) 06/07/2022   Acute CHF (congestive heart failure) (HCC) 05/27/2022   Decubitus ulcer of right heel 05/27/2022   Nausea &  vomiting 05/27/2022   Wild-type transthyretin-related (ATTR) amyloidosis (HCC) 04/26/2022   MGUS (monoclonal gammopathy of unknown significance) 02/15/2022   PAD (peripheral artery disease) (HCC) 01/11/2022   Non-ischemic cardiomyopathy (HCC) 01/11/2022   Stage III chronic kidney disease (HCC) 01/11/2022   Severe aortic stenosis 01/11/2022   S/P TAVR (transcatheter aortic valve replacement) 01/11/2022   Caries 11/12/2021   Teeth missing 11/12/2021   Retained tooth root 11/12/2021   Chronic apical periodontitis 11/12/2021   Chronic periodontitis 11/12/2021   Accretions on teeth 11/12/2021   Long term (current) use of antithrombotics/antiplatelets 11/12/2021   Phobia of dental procedure 11/12/2021   Defective dental restoration 11/12/2021   Attrition, teeth excessive 11/12/2021   Torus mandibularis 11/12/2021   Diastema of teeth 11/12/2021   Encounter for preoperative dental examination 11/09/2021   Acute kidney injury superimposed on chronic kidney disease (HCC) 10/30/2021   Acute on chronic systolic CHF (congestive heart failure) (HCC) 10/28/2021   Hypertensive urgency 10/28/2021   Type 2 diabetes mellitus with hyperlipidemia (HCC) 07/01/2021   Burn of first degree of multiple sites of unspecified wrist and hand, initial encounter 12/31/2020   Male erectile disorder (CODE) 12/18/2020   Encounter for sterilization 12/18/2020   Diabetic neuropathy (HCC) 09/11/2020   Low back pain, unspecified 09/11/2020   Unspecified kidney failure 09/11/2020   Unsteadiness on feet 09/11/2020   Status post amputation of lesser toe of left foot (HCC) 07/22/2020   Hypertensive heart disease with chronic combined systolic and diastolic congestive heart failure (HCC) 07/22/2020   Carotid stenosis, asymptomatic 02/03/2020   Peripheral vascular disease (HCC)    Carotid artery stenosis 12/16/2019   Moderate aortic stenosis 12/02/2019   Critical ischemia of foot (HCC) 11/18/2019   Carotid artery disease  (HCC) 08/23/2019   Critical limb ischemia with history of revascularization of same extremity (HCC) 08/02/2019   Diabetes mellitus without complication (HCC)    Cardiac murmur 02/22/2018   Vitamin D deficiency 06/23/2015   Pancreatitis, acute 09/27/2012   Dyslipidemia 09/27/2012   Erectile dysfunction 09/27/2012   Unspecified essential hypertension 09/27/2012   Obesity, unspecified 08/14/2012   Hyponatremia 08/14/2012   Acute pancreatitis 08/12/2012   HTN (hypertension) 08/12/2012   Hyperlipidemia 08/12/2012   Metabolic acidosis 08/12/2012   PCP:  Hoy Register, MD Pharmacy:   [facility] [phone] Ginette Otto, [place] - [address] [place] [address] Phone: [phone] Fax: [phone]     Social Determinants of Health (SDOH) Social History: SDOH Screenings   Food Insecurity: No Food Insecurity (07/29/2022)  Housing: Low Risk  (07/29/2022)  Transportation Needs: No Transportation Needs (07/29/2022)  Utilities: Not At Risk (07/29/2022)  Alcohol Screen: Low Risk  (10/29/2021)  Depression (PHQ2-9): Low Risk  (05/02/2022)  Financial Resource Strain: Low Risk  (10/29/2021)  Physical Activity: Sufficiently Active (12/06/2020)  Social Connections: Moderately Isolated (12/06/2020)  Stress: No Stress Concern Present (12/06/2020)  Tobacco Use: Low Risk  (07/30/2022)   SDOH Interventions:     Readmission Risk Interventions     No data to display  Oletta Lamas, MSW, LCSWA, LCASA Transitions of Care  Clinical Social Worker I

## 2022-07-30 NOTE — Progress Notes (Signed)
Patient ID: Caleb [name], male   DOB: February 15, 1951, 72 y.o.   MRN: [id number] Patient is postoperative day 1 partial calcaneal excision as well as ulceration from the Achilles.  The VAC was plugged into the bed and not working.  The seal was reestablished.  Plan for discharge to skilled nursing.

## 2022-07-31 LAB — CBC
HCT: 26.5 % — ABNORMAL LOW (ref 39.0–52.0)
Hemoglobin: 8.6 g/dL — ABNORMAL LOW (ref 13.0–17.0)
MCH: 26.5 pg (ref 26.0–34.0)
MCHC: 32.5 g/dL (ref 30.0–36.0)
MCV: 81.5 fL (ref 80.0–100.0)
Platelets: 267 10*3/uL (ref 150–400)
RBC: 3.25 MIL/uL — ABNORMAL LOW (ref 4.22–5.81)
RDW: 16.8 % — ABNORMAL HIGH (ref 11.5–15.5)
WBC: 9.7 10*3/uL (ref 4.0–10.5)
nRBC: 0 % (ref 0.0–0.2)

## 2022-07-31 LAB — BASIC METABOLIC PANEL
Anion gap: 10 (ref 5–15)
BUN: 45 mg/dL — ABNORMAL HIGH (ref 8–23)
CO2: 23 mmol/L (ref 22–32)
Calcium: 8.6 mg/dL — ABNORMAL LOW (ref 8.9–10.3)
Chloride: 96 mmol/L — ABNORMAL LOW (ref 98–111)
Creatinine, Ser: 1.67 mg/dL — ABNORMAL HIGH (ref 0.61–1.24)
GFR, Estimated: 43 mL/min — ABNORMAL LOW (ref >60)
Glucose, Bld: 149 mg/dL — ABNORMAL HIGH (ref 70–99)
Potassium: 4.5 mmol/L (ref 3.5–5.1)
Sodium: 129 mmol/L — ABNORMAL LOW (ref 135–145)

## 2022-07-31 LAB — GLUCOSE, CAPILLARY
Glucose-Capillary: 146 mg/dL — ABNORMAL HIGH (ref 70–99)
Glucose-Capillary: 148 mg/dL — ABNORMAL HIGH (ref 70–99)
Glucose-Capillary: 118 mg/dL — ABNORMAL HIGH (ref 70–99)
Glucose-Capillary: 171 mg/dL — ABNORMAL HIGH (ref 70–99)

## 2022-07-31 MED ORDER — HYDROCODONE-ACETAMINOPHEN 7.5-325 MG PO TABS
1.0000 | ORAL_TABLET | ORAL | Status: DC | PRN
  Administered 2022-08-02: 2 via ORAL
  Filled 2022-07-31: qty 2

## 2022-07-31 MED ORDER — HYDROCODONE-ACETAMINOPHEN 10-325 MG PO TABS
1.0000 | ORAL_TABLET | ORAL | Status: DC | PRN
  Administered 2022-07-31 – 2022-08-01 (×5): 2 via ORAL
  Filled 2022-07-31 (×5): qty 2

## 2022-07-31 NOTE — Evaluation (Signed)
Occupational Therapy Evaluation Patient Details Name: Caleb [name] MRN: [id number] DOB: 1950-09-10 Today's Date: 07/31/2022   History of Present Illness Pt is 72 yo male who presents on 07/29/22 for R calcaneal I&D. PMH: CHF, HTN, HLD, DM2, GERD, PVD, pancreatitis, s/p TAVR   Clinical Impression   PTA, pt lived alone and reporting he was independent. Upon eval, pt performing UB ADL with set-up and LB ADL with up to min A. Pt requiring up to min A for functional mobility with RW and maintaining weightbearing precautions. Requiring cues throughout. Pt demonstrates excellent motivation and rehabilitation potential, however, CIR denied. Patient will benefit from continued inpatient follow up therapy, <3 hours/day.     Recommendations for follow up therapy are one component of a multi-disciplinary discharge planning process, led by the attending physician.  Recommendations may be updated based on patient status, additional functional criteria and insurance authorization.   Assistance Recommended at Discharge Intermittent Supervision/Assistance  Patient can return home with the following A little help with walking and/or transfers;A little help with bathing/dressing/bathroom;A lot of help with bathing/dressing/bathroom;Assistance with cooking/housework;Assist for transportation;Help with stairs or ramp for entrance    Functional Status Assessment  Patient has had a recent decline in their functional status and demonstrates the ability to make significant improvements in function in a reasonable and predictable amount of time.  Equipment Recommendations  Other (comment) (defer)    Recommendations for Other Services       Precautions / Restrictions Precautions Precautions: None Required Braces or Orthoses: Other Brace Other Brace: PRAFO R foot Restrictions Weight Bearing Restrictions: Yes RLE Weight Bearing: Non weight bearing      Mobility Bed Mobility Overal bed mobility: Modified  Independent             General bed mobility comments: pt able to come safely to EOB without assist    Transfers Overall transfer level: Needs assistance Equipment used: Rolling walker (2 wheels) Transfers: Sit to/from Stand, Bed to chair/wheelchair/BSC Sit to Stand: Min assist     Step pivot transfers: Min guard, Min assist     General transfer comment: VC for hand placement; light lift assist from lower surfaces      Balance Overall balance assessment: Mild deficits observed, not formally tested                                         ADL either performed or assessed with clinical judgement   ADL Overall ADL's : Needs assistance/impaired Eating/Feeding: Modified independent;Sitting   Grooming: Set up;Sitting;Wash/dry face;Oral care;Wash/dry hands (shaving) Grooming Details (indicate cue type and reason): in restroom on a chair by sink Upper Body Bathing: Supervision/ safety;Sitting   Lower Body Bathing: Sitting/lateral leans;Minimal assistance Lower Body Bathing Details (indicate cue type and reason): sititng on chair by sink. Min cues for appropriate weight shift and avoiding putting weight through LE Upper Body Dressing : Set up;Sitting   Lower Body Dressing: Sitting/lateral leans;Sit to/from stand;Moderate assistance   Toilet Transfer: Minimal assistance;Ambulation;Rolling walker (2 wheels);BSC/3in1 Toilet Transfer Details (indicate cue type and reason): cues for step pattern Toileting- Clothing Manipulation and Hygiene: Set up;Sitting/lateral lean       Functional mobility during ADLs: Minimal assistance;Rolling walker (2 wheels)       Vision Baseline Vision/History: 1 Wears glasses Ability to See in Adequate Light: 0 Adequate Patient Visual Report: No change from baseline Vision Assessment?: No apparent visual  deficits     Perception Perception Perception Tested?: No   Praxis Praxis Praxis tested?: Not tested    Pertinent  Vitals/Pain Pain Assessment Pain Assessment: Faces Faces Pain Scale: Hurts little more Pain Location: R foot Pain Descriptors / Indicators: Sore Pain Intervention(s): Limited activity within patient's tolerance     Hand Dominance Left   Extremity/Trunk Assessment Upper Extremity Assessment Upper Extremity Assessment: Generalized weakness (neuropathy of BUE; using functionally but has built up grips on silverwear on arrival. Continue to assess. Decresaed dexterity and proprioception)   Lower Extremity Assessment Lower Extremity Assessment: Defer to PT evaluation   Cervical / Trunk Assessment Cervical / Trunk Assessment: Normal   Communication Communication Communication: No difficulties   Cognition Arousal/Alertness: Awake/alert Behavior During Therapy: WFL for tasks assessed/performed Overall Cognitive Status: Within Functional Limits for tasks assessed                                       General Comments       Exercises     Shoulder Instructions      Home Living Family/patient expects to be discharged to:: Private residence Living Arrangements: Alone Available Help at Discharge: Family;Available PRN/intermittently Type of Home: House Home Access: Level entry     Home Layout: One level     Bathroom Shower/Tub: Chief Strategy Officer: Standard     Home Equipment: Crutches;Cane - single point;Wheelchair - manual   Additional Comments: could stay with cousin but she has stairs to enter her home. She could potentially come stay with him but he reports he does not live close to her work      Prior Functioning/Environment Prior Level of Function : Independent/Modified Independent;Driving             Mobility Comments: independent ADLs Comments: independent        OT Problem List: Decreased strength;Impaired balance (sitting and/or standing);Decreased activity tolerance;Pain;Decreased safety awareness;Decreased knowledge of  use of DME or AE;Decreased knowledge of precautions      OT Treatment/Interventions: Therapeutic exercise;Self-care/ADL training;DME and/or AE instruction;Balance training;Patient/family education;Therapeutic activities    OT Goals(Current goals can be found in the care plan section) Acute Rehab OT Goals Patient Stated Goal: get better OT Goal Formulation: With patient Time For Goal Achievement: 08/14/22 Potential to Achieve Goals: Good  OT Frequency: Min 2X/week    Co-evaluation              AM-PAC OT "6 Clicks" Daily Activity     Outcome Measure Help from another person eating meals?: None Help from another person taking care of personal grooming?: A Little Help from another person toileting, which includes using toliet, bedpan, or urinal?: A Little Help from another person bathing (including washing, rinsing, drying)?: A Little Help from another person to put on and taking off regular upper body clothing?: A Little Help from another person to put on and taking off regular lower body clothing?: A Lot 6 Click Score: 18   End of Session Equipment Utilized During Treatment: Gait belt;Rolling walker (2 wheels) Nurse Communication: Mobility status;Other (comment) (sheets changed, pt bathed and groomed)  Activity Tolerance: Patient tolerated treatment well Patient left: in bed;with call bell/phone within reach;with bed alarm set  OT Visit Diagnosis: Unsteadiness on feet (R26.81);Muscle weakness (generalized) (M62.81);Other abnormalities of gait and mobility (R26.89)                Time:  1610-9604 OT Time Calculation (min): 55 min Charges:  OT General Charges $OT Visit: 1 Visit OT Evaluation $OT Eval Low Complexity: 1 Low OT Treatments $Self Care/Home Management : 38-52 mins  Tyler Deis, OTR/L [facility] Acute Rehabilitation Office: [phone]   Myrla Halsted 07/31/2022, 2:42 PM

## 2022-07-31 NOTE — Progress Notes (Signed)
Physical Therapy Treatment Patient Details Name: Caleb [name] MRN: [id number] DOB: Aug 25, 1950 Today's Date: 07/31/2022   History of Present Illness Pt is 72 yo male who presents on 07/29/22 for R calcaneal I&D. PMH: CHF, HTN, HLD, DM2, GERD, PVD, pancreatitis, s/p TAVR    PT Comments    Continuing work on functional mobility and activity tolerance;  Session focused on practicing NWB with taking steps with RW, and pt able to walk further distance than last PT session; very motivated to improve; Noting that AIR may not be a dc option for pt this admission;   Will ask Dr. Lajoyce Corners if he has any reservations re: trying a knee scooter for next session  Recommendations for follow up therapy are one component of a multi-disciplinary discharge planning process, led by the attending physician.  Recommendations may be updated based on patient status, additional functional criteria and insurance authorization.  Follow Up Recommendations  Can patient physically be transported by private vehicle: Yes    Assistance Recommended at Discharge Intermittent Supervision/Assistance  Patient can return home with the following A little help with walking and/or transfers;A little help with bathing/dressing/bathroom;Assistance with cooking/housework;Assist for transportation;Help with stairs or ramp for entrance   Equipment Recommendations  Rolling walker (2 wheels)    Recommendations for Other Services       Precautions / Restrictions Precautions Precautions: None Required Braces or Orthoses: Other Brace Other Brace: PRAFO R foot Restrictions Weight Bearing Restrictions: Yes RLE Weight Bearing: Non weight bearing Other Position/Activity Restrictions: order set states NWB but op note states TTWB so went with the more conservative status of NWB but did allow pt to place front of R foot on floor in PRAFO     Mobility  Bed Mobility Overal bed mobility: Modified Independent                   Transfers Overall transfer level: Needs assistance Equipment used: Rolling walker (2 wheels) Transfers: Sit to/from Stand Sit to Stand: Min assist           General transfer comment: VC for hand placement; light lift assist from lower surfaces; stood from slightly elevated bed and from couch near the window    Ambulation/Gait Ambulation/Gait assistance: Min assist Gait Distance (Feet): 7 Feet (x2) Assistive device: Rolling walker (2 wheels) Gait Pattern/deviations: Step-to pattern Gait velocity: decreased     General Gait Details: Noting better maintenance of NWB with RW; slow steps; occasional cues for RW position and placement relative to body; front of PRAFO touching down a few times   Stairs             Wheelchair Mobility    Modified Rankin (Stroke Patients Only)       Balance                                            Cognition Arousal/Alertness: Awake/alert Behavior During Therapy: WFL for tasks assessed/performed Overall Cognitive Status: Within Functional Limits for tasks assessed                                          Exercises      General Comments General comments (skin integrity, edema, etc.): Discussed possibility of using a knee scooter; will ask Dr. Lajoyce Corners if he  has reservations to suing one      Pertinent Vitals/Pain Pain Assessment Pain Assessment: Faces Faces Pain Scale: Hurts a little bit Pain Location: R foot Pain Descriptors / Indicators: Sore Pain Intervention(s): Monitored during session    Home Living Family/patient expects to be discharged to:: Private residence Living Arrangements: Alone Available Help at Discharge: Family;Available PRN/intermittently Type of Home: House Home Access: Level entry       Home Layout: One level Home Equipment: Crutches;Cane - single point;Wheelchair - manual Additional Comments: could stay with cousin but she has stairs to enter her home. She  could potentially come stay with him but he reports he does not live close to her work    Prior Function            PT Goals (current goals can now be found in the care plan section) Acute Rehab PT Goals Patient Stated Goal: move to [place] to live near his daughter PT Goal Formulation: With patient Time For Goal Achievement: 08/13/22 Potential to Achieve Goals: Good Progress towards PT goals: Progressing toward goals    Frequency    Min 3X/week      PT Plan Current plan remains appropriate    Co-evaluation              AM-PAC PT "6 Clicks" Mobility   Outcome Measure  Help needed turning from your back to your side while in a flat bed without using bedrails?: None Help needed moving from lying on your back to sitting on the side of a flat bed without using bedrails?: None Help needed moving to and from a bed to a chair (including a wheelchair)?: A Little Help needed standing up from a chair using your arms (e.g., wheelchair or bedside chair)?: A Little Help needed to walk in hospital room?: A Little Help needed climbing 3-5 steps with a railing? : Total 6 Click Score: 18    End of Session Equipment Utilized During Treatment: Gait belt Activity Tolerance: Patient tolerated treatment well Patient left: in bed;with call bell/phone within reach;with bed alarm set Nurse Communication: Mobility status PT Visit Diagnosis: Difficulty in walking, not elsewhere classified (R26.2);Pain Pain - Right/Left: Right Pain - part of body: Ankle and joints of foot     Time: 1610-9604 PT Time Calculation (min) (ACUTE ONLY): 25 min  Charges:  $Gait Training: 8-22 mins $Therapeutic Activity: 8-22 mins                     Van Clines, PT  Acute Rehabilitation Services Office [phone]    Caleb [name] 07/31/2022, 5:36 PM

## 2022-07-31 NOTE — Progress Notes (Signed)
Patient ID: Caleb [name], male   DOB: 11-Jan-1951, 72 y.o.   MRN: [id number] Patient is postoperative day 2 partial calcaneal excision as well as ulceration from the Achilles.  Complaining of some pain this AM improved with pain medication.  Plan for discharge to skilled nursing pending CM.

## 2022-08-01 ENCOUNTER — Other Ambulatory Visit (HOSPITAL_COMMUNITY): Payer: Self-pay

## 2022-08-01 ENCOUNTER — Telehealth (HOSPITAL_COMMUNITY): Payer: Self-pay | Admitting: Pharmacy Technician

## 2022-08-01 LAB — SURGICAL PATHOLOGY

## 2022-08-01 LAB — GLUCOSE, CAPILLARY
Glucose-Capillary: 155 mg/dL — ABNORMAL HIGH (ref 70–99)
Glucose-Capillary: 149 mg/dL — ABNORMAL HIGH (ref 70–99)
Glucose-Capillary: 107 mg/dL — ABNORMAL HIGH (ref 70–99)
Glucose-Capillary: 150 mg/dL — ABNORMAL HIGH (ref 70–99)

## 2022-08-01 MED ORDER — ACETAMINOPHEN 325 MG PO TABS
650.0000 mg | ORAL_TABLET | Freq: Four times a day (QID) | ORAL | Status: DC | PRN
  Administered 2022-08-02: 650 mg via ORAL
  Filled 2022-08-01: qty 2

## 2022-08-01 MED ORDER — ACETAMINOPHEN 325 MG PO TABS: 650.0000 mg | ORAL_TABLET | Freq: Four times a day (QID) | ORAL | Status: DC | PRN

## 2022-08-01 NOTE — Telephone Encounter (Signed)
Patient Advocate Encounter   Received notification that prior authorization for Eliquis 5MG  tablets is required.   PA submitted on 08/01/2022 [facility] Insurance [employer] Electronic PA Form Status is pending       Roland Earl, CPhT Pharmacy Patient Advocate Specialist [facility] Patient Advocate Team Direct Number: [phone]  Fax: [phone]

## 2022-08-01 NOTE — Telephone Encounter (Signed)
Patient Advocate Encounter  Prior Authorization for Eliquis 5MG  tablets  has been approved.    PA# [id number] Insurance [employer] Electronic PA Form  Effective dates: 08/01/2022 through 03/21/2023  Patients co-pay is $45.00.     Roland Earl, CPhT Pharmacy Patient Advocate Specialist [facility] Patient Advocate Team Direct Number: [phone]  Fax: [phone]

## 2022-08-01 NOTE — Care Management Important Message (Signed)
Important Message  Patient Details  Name: Caleb [name] MRN: [id number] Date of Birth: 1950/09/05   Medicare Important Message Given:  Yes     Sherilyn Banker 08/01/2022, 1:10 PM

## 2022-08-01 NOTE — Progress Notes (Signed)
Patient ID: Caleb [name], male   DOB: [birth year], 72 y.o.   MRN: [id number] Patient is status post partial calcaneal excision with Achilles debridement and wound VAC placement.  Anticipate discharge to skilled nursing facility.  Patient's pain is better controlled.

## 2022-08-01 NOTE — Progress Notes (Signed)
Mobility Specialist Progress Note   08/01/22 1421  Mobility  Activity Ambulated with assistance in room;Transferred from bed to chair  Level of Assistance Contact guard assist, steadying assist  Assistive Device Front wheel walker  Distance Ambulated (ft) 8 ft  Range of Motion/Exercises Active;All extremities  RLE Weight Bearing NWB  Activity Response Tolerated well   Patient received in supine, eager to participate. Completed bed mobility independently and stood min guard from elevated bed height. Ambulated min guard to supervision level with a short "hop-to" gait. Tolerated without complaint or incident. Mentioned concerns with disposition and discharge, CSW and CM notified. Was left in recliner with all needs met, call bell in reach.   Caleb [name], BS EXP Mobility Specialist Please contact via SecureChat or Rehab office at [phone]

## 2022-08-01 NOTE — TOC Progression Note (Addendum)
Transition of Care [facility]) - Progression Note    Patient Details  Name: Caleb [name] MRN: [id number] Date of Birth: 1950/04/16  Transition [facility] Care [facility]) CM/SW Contact  Epifanio Lesches, RN Phone Number: 08/01/2022, 10:54 AM  Clinical Narrative:    Pt declined SNF placement. Agreeable to home health services. Preference : [facility]. Referral made with Kandee Keen / [facility] and learned pt is active with them. Resumption orders placed for PT,OT and SW. TOC team to f/u with DME needs. Pt requesting Knee Scooter, therapist has made MD. [facility] team to watch for order. Pt without transportatin issues or RX med  concerns...   Expected Discharge Plan: Skilled Nursing Facility Barriers to Discharge: Continued Medical Work up  Expected Discharge Plan and Services       Living arrangements for the past 2 months: Apartment                           HH Arranged: PT, OT, Social Work   Date [employer] Contacted: 08/01/22 Time HH Agency Contacted: 1053 Representative spoke with at [facility] Agency: Kandee Keen   Social Determinants of Health (SDOH) Interventions SDOH Screenings   Food Insecurity: No Food Insecurity (07/29/2022)  Housing: Low Risk  (07/29/2022)  Transportation Needs: No Transportation Needs (07/29/2022)  Utilities: Not At Risk (07/29/2022)  Alcohol Screen: Low Risk  (10/29/2021)  Depression (PHQ2-9): Low Risk  (05/02/2022)  Financial Resource Strain: Low Risk  (10/29/2021)  Physical Activity: Sufficiently Active (12/06/2020)  Social Connections: Moderately Isolated (12/06/2020)  Stress: No Stress Concern Present (12/06/2020)  Tobacco Use: Low Risk  (07/30/2022)    Readmission Risk Interventions     No data to display

## 2022-08-01 NOTE — TOC Progression Note (Signed)
Transition of Care [facility]) - Progression Note    Patient Details  Name: Caleb [name] MRN: [id number] Date of Birth: 03-02-1951  Transition of Care [facility]) CM/SW Contact  Lorri Frederick, LCSW Phone Number: 08/01/2022, 10:18 AM  Clinical Narrative:   CSW spoke with pt regarding DC plan.  Pt not aware that CIR is not an option, discussed this.  Pt was at [facility] last month, reports he was in his copay days at that point, would still be in copay days with a new admission.  Pt states he cannot afford that and would prefer to DC home if CIR is not an option.  Pt lives alone, does have help from a friend who works during the day but can assist in the evening.  Pt has wheelchair and walker at home and says he can manage during the time he would be alone.  Pt did have [facility] after last SNF DC and would want to resume with [facility].  RNCM/MD informed.    Expected Discharge Plan: Skilled Nursing Facility Barriers to Discharge: Continued Medical Work up  Expected Discharge Plan and Services       Living arrangements for the past 2 months: Apartment                                       Social Determinants of Health (SDOH) Interventions SDOH Screenings   Food Insecurity: No Food Insecurity (07/29/2022)  Housing: Low Risk  (07/29/2022)  Transportation Needs: No Transportation Needs (07/29/2022)  Utilities: Not At Risk (07/29/2022)  Alcohol Screen: Low Risk  (10/29/2021)  Depression (PHQ2-9): Low Risk  (05/02/2022)  Financial Resource Strain: Low Risk  (10/29/2021)  Physical Activity: Sufficiently Active (12/06/2020)  Social Connections: Moderately Isolated (12/06/2020)  Stress: No Stress Concern Present (12/06/2020)  Tobacco Use: Low Risk  (07/30/2022)    Readmission Risk Interventions     No data to display

## 2022-08-02 ENCOUNTER — Encounter (HOSPITAL_COMMUNITY): Payer: Self-pay

## 2022-08-02 ENCOUNTER — Observation Stay (HOSPITAL_COMMUNITY)
Admission: EM | Admit: 2022-08-02 | Discharge: 2022-08-04 | Disposition: A | Discharge status: Home Health | Payer: Medicare HMO | Attending: Family Medicine | Admitting: Family Medicine

## 2022-08-02 ENCOUNTER — Emergency Department (HOSPITAL_COMMUNITY): Payer: Medicare HMO

## 2022-08-02 ENCOUNTER — Other Ambulatory Visit: Payer: Self-pay

## 2022-08-02 DIAGNOSIS — N1832 Chronic kidney disease, stage 3b: Secondary | ICD-10-CM | POA: Insufficient documentation

## 2022-08-02 DIAGNOSIS — Z7982 Long term (current) use of aspirin: Secondary | ICD-10-CM | POA: Diagnosis not present

## 2022-08-02 DIAGNOSIS — E785 Hyperlipidemia, unspecified: Secondary | ICD-10-CM | POA: Insufficient documentation

## 2022-08-02 DIAGNOSIS — Z79899 Other long term (current) drug therapy: Secondary | ICD-10-CM | POA: Insufficient documentation

## 2022-08-02 DIAGNOSIS — Z8739 Personal history of other diseases of the musculoskeletal system and connective tissue: Secondary | ICD-10-CM

## 2022-08-02 DIAGNOSIS — E1122 Type 2 diabetes mellitus with diabetic chronic kidney disease: Secondary | ICD-10-CM | POA: Diagnosis not present

## 2022-08-02 DIAGNOSIS — Z7902 Long term (current) use of antithrombotics/antiplatelets: Secondary | ICD-10-CM | POA: Insufficient documentation

## 2022-08-02 DIAGNOSIS — E119 Type 2 diabetes mellitus without complications: Secondary | ICD-10-CM | POA: Diagnosis present

## 2022-08-02 DIAGNOSIS — R112 Nausea with vomiting, unspecified: Secondary | ICD-10-CM | POA: Diagnosis present

## 2022-08-02 DIAGNOSIS — Z89422 Acquired absence of other left toe(s): Secondary | ICD-10-CM | POA: Insufficient documentation

## 2022-08-02 DIAGNOSIS — R2681 Unsteadiness on feet: Secondary | ICD-10-CM | POA: Insufficient documentation

## 2022-08-02 DIAGNOSIS — I130 Hypertensive heart and chronic kidney disease with heart failure and stage 1 through stage 4 chronic kidney disease, or unspecified chronic kidney disease: Secondary | ICD-10-CM | POA: Insufficient documentation

## 2022-08-02 DIAGNOSIS — Z952 Presence of prosthetic heart valve: Secondary | ICD-10-CM | POA: Diagnosis not present

## 2022-08-02 DIAGNOSIS — T85698A Other mechanical complication of other specified internal prosthetic devices, implants and grafts, initial encounter: Principal | ICD-10-CM | POA: Insufficient documentation

## 2022-08-02 DIAGNOSIS — Z794 Long term (current) use of insulin: Secondary | ICD-10-CM | POA: Insufficient documentation

## 2022-08-02 DIAGNOSIS — Z7901 Long term (current) use of anticoagulants: Secondary | ICD-10-CM | POA: Diagnosis not present

## 2022-08-02 DIAGNOSIS — M6281 Muscle weakness (generalized): Secondary | ICD-10-CM | POA: Insufficient documentation

## 2022-08-02 DIAGNOSIS — I739 Peripheral vascular disease, unspecified: Secondary | ICD-10-CM | POA: Diagnosis present

## 2022-08-02 DIAGNOSIS — R4182 Altered mental status, unspecified: Secondary | ICD-10-CM | POA: Diagnosis not present

## 2022-08-02 DIAGNOSIS — R109 Unspecified abdominal pain: Secondary | ICD-10-CM | POA: Diagnosis not present

## 2022-08-02 DIAGNOSIS — R2689 Other abnormalities of gait and mobility: Secondary | ICD-10-CM | POA: Insufficient documentation

## 2022-08-02 DIAGNOSIS — I5022 Chronic systolic (congestive) heart failure: Secondary | ICD-10-CM | POA: Insufficient documentation

## 2022-08-02 DIAGNOSIS — D638 Anemia in other chronic diseases classified elsewhere: Secondary | ICD-10-CM | POA: Diagnosis present

## 2022-08-02 DIAGNOSIS — E1169 Type 2 diabetes mellitus with other specified complication: Secondary | ICD-10-CM | POA: Insufficient documentation

## 2022-08-02 DIAGNOSIS — I700 Atherosclerosis of aorta: Secondary | ICD-10-CM | POA: Diagnosis not present

## 2022-08-02 DIAGNOSIS — R000 Tachycardia, unspecified: Secondary | ICD-10-CM | POA: Diagnosis not present

## 2022-08-02 DIAGNOSIS — I5020 Unspecified systolic (congestive) heart failure: Secondary | ICD-10-CM | POA: Diagnosis present

## 2022-08-02 DIAGNOSIS — N183 Chronic kidney disease, stage 3 unspecified: Secondary | ICD-10-CM | POA: Diagnosis present

## 2022-08-02 DIAGNOSIS — D72829 Elevated white blood cell count, unspecified: Secondary | ICD-10-CM | POA: Diagnosis present

## 2022-08-02 DIAGNOSIS — R1110 Vomiting, unspecified: Secondary | ICD-10-CM | POA: Diagnosis not present

## 2022-08-02 DIAGNOSIS — D649 Anemia, unspecified: Secondary | ICD-10-CM | POA: Insufficient documentation

## 2022-08-02 DIAGNOSIS — Z4689 Encounter for fitting and adjustment of other specified devices: Secondary | ICD-10-CM

## 2022-08-02 DIAGNOSIS — I10 Essential (primary) hypertension: Secondary | ICD-10-CM | POA: Diagnosis present

## 2022-08-02 LAB — CBG MONITORING, ED: Glucose-Capillary: 180 mg/dL — ABNORMAL HIGH (ref 70–99)

## 2022-08-02 LAB — GLUCOSE, CAPILLARY: Glucose-Capillary: 165 mg/dL — ABNORMAL HIGH (ref 70–99)

## 2022-08-02 MED ORDER — HYDROCODONE-ACETAMINOPHEN 5-325 MG PO TABS: 1.0000 | ORAL_TABLET | ORAL | 0 refills | Status: DC | PRN

## 2022-08-02 MED ORDER — LACTATED RINGERS IV BOLUS
1000.0000 mL | Freq: Once | INTRAVENOUS | Status: AC
  Administered 2022-08-02: 1000 mL via INTRAVENOUS

## 2022-08-02 NOTE — Progress Notes (Signed)
Physical Therapy Treatment Patient Details Name: Caleb [name] MRN: [id number] DOB: 05/21/1950 Today's Date: 08/02/2022   History of Present Illness Pt is 72 yo male who presents on 07/29/22 for R calcaneal I&D. PMH: CHF, HTN, HLD, DM2, GERD, PVD, pancreatitis, s/p TAVR    PT Comments    Pt received in supine and agreeable to session. Pt requested to trial a knee scooter, however did not feel comfortable progressing ambulation with it due to increased unsteadiness. Pt was able to perform gait trial with RW performing a hop-to pattern with min guard for safety. Pt reporting use of wheelchair for further distances at home. Pt demonstrating increased difficulty standing from EOB, but improved when standing from recliner. Anticipate pt will be able to manage mobility needs at home.    Recommendations for follow up therapy are one component of a multi-disciplinary discharge planning process, led by the attending physician.  Recommendations may be updated based on patient status, additional functional criteria and insurance authorization.     Assistance Recommended at Discharge Intermittent Supervision/Assistance  Patient can return home with the following A little help with walking and/or transfers;A little help with bathing/dressing/bathroom;Assistance with cooking/housework;Assist for transportation;Help with stairs or ramp for entrance   Equipment Recommendations  Rolling walker (2 wheels)    Recommendations for Other Services       Precautions / Restrictions Precautions Precautions: None Required Braces or Orthoses: Other Brace Other Brace: PRAFO R foot Restrictions Weight Bearing Restrictions: Yes RLE Weight Bearing: Weight bearing as tolerated Other Position/Activity Restrictions: WBAT on ball of R foot per recent ortho note     Mobility  Bed Mobility Overal bed mobility: Modified Independent                  Transfers Overall transfer level: Needs  assistance Equipment used: Rolling walker (2 wheels) Transfers: Sit to/from Stand Sit to Stand: Min guard, Min assist           General transfer comment: From EOB with min A for power up and balance and from recliner with min guard and cues for hand placement    Ambulation/Gait Ambulation/Gait assistance: Min guard Gait Distance (Feet): 10 Feet Assistive device: Rolling walker (2 wheels) Gait Pattern/deviations: Step-to pattern Gait velocity: decreased     General Gait Details: Attempted trial with knee scooter, but pt demonstrating unsteadiness and stating that he does not feel comfortable using it. Pt able to perform hop-to pattern with intermittent RLE TTWB, but able to maintain WB precautions throughout       Balance Overall balance assessment: Mild deficits observed, not formally tested                                          Cognition Arousal/Alertness: Awake/alert Behavior During Therapy: WFL for tasks assessed/performed Overall Cognitive Status: Within Functional Limits for tasks assessed                                          Exercises      General Comments        Pertinent Vitals/Pain Pain Assessment Pain Assessment: Faces Faces Pain Scale: Hurts little more Pain Location: R foot Pain Descriptors / Indicators: Sore, Guarding, Grimacing Pain Intervention(s): Monitored during session     PT Goals (current goals can  now be found in the care plan section) Acute Rehab PT Goals Patient Stated Goal: move to [place] to live near his daughter PT Goal Formulation: With patient Time For Goal Achievement: 08/13/22 Potential to Achieve Goals: Good Progress towards PT goals: Progressing toward goals    Frequency    Min 3X/week      PT Plan Current plan remains appropriate       AM-PAC PT "6 Clicks" Mobility   Outcome Measure  Help needed turning from your back to your side while in a flat bed without using  bedrails?: None Help needed moving from lying on your back to sitting on the side of a flat bed without using bedrails?: None Help needed moving to and from a bed to a chair (including a wheelchair)?: A Little Help needed standing up from a chair using your arms (e.g., wheelchair or bedside chair)?: A Little Help needed to walk in hospital room?: A Little Help needed climbing 3-5 steps with a railing? : Total 6 Click Score: 18    End of Session Equipment Utilized During Treatment: Gait belt Activity Tolerance: Patient tolerated treatment well Patient left: in chair;with call bell/phone within reach Nurse Communication: Mobility status PT Visit Diagnosis: Difficulty in walking, not elsewhere classified (R26.2);Pain Pain - Right/Left: Right Pain - part of body: Ankle and joints of foot     Time: 1308-6578 PT Time Calculation (min) (ACUTE ONLY): 32 min  Charges:  $Gait Training: 8-22 mins $Therapeutic Activity: 8-22 mins                     Caleb [name], PTA Acute Rehabilitation Services Secure Chat Preferred  [phone]    Caleb [name] 08/02/2022, 9:40 AM

## 2022-08-02 NOTE — Progress Notes (Signed)
Discharge instructions given. Patient verbalized understanding and all questions were answered.  ?

## 2022-08-02 NOTE — Discharge Summary (Signed)
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 male who was admitted 07/29/2022 with a chief complaint of dehiscence right heel wound, with a final diagnosis of Dehiscence Right Foot Wound.  Patient was brought to the operating room on 07/29/2022 and underwent Procedure(s): RIGHT PARTIAL CALCANEUS EXCISION.    Patient was given perioperative antibiotics:  Anti-infectives (From admission, onward)    Start     Dose/Rate Route Frequency Ordered Stop   07/29/22 1130  ceFAZolin (ANCEF) IVPB 2g/100 mL premix        2 g 200 mL/hr over 30 Minutes Intravenous Every 8 hours 07/29/22 1116 07/29/22 2239   07/29/22 0700  ceFAZolin (ANCEF) IVPB 2g/100 mL premix        2 g 200 mL/hr over 30 Minutes Intravenous On call to O.R. 07/29/22 9604 07/29/22 0941   07/29/22 0700  ceFAZolin (ANCEF) IVPB 2g/100 mL premix  Status:  Discontinued        2 g 200 mL/hr over 30 Minutes Intravenous On call to O.R. 07/29/22 5409 07/29/22 8119     .  Patient was given sequential compression devices, early ambulation, and aspirin for DVT prophylaxis.  Recent vital signs: Patient Vitals for the past 24 hrs:  BP Temp Temp src Pulse Resp SpO2  08/02/22 0415 101/63 98 F (36.7 C) -- 85 17 96 %  08/01/22 1918 102/78 98.2 F (36.8 C) -- 92 17 100 %  08/01/22 1420 (!) 132/92 97.7 F (36.5 C) Oral 92 20 100 %  08/01/22 0717 115/78 97.8 F (36.6 C) Oral 80 18 99 %  .  Recent laboratory studies: No results found.  Discharge Medications:   Allergies as of 08/02/2022       Reactions   Codeine Nausea And Vomiting   Norco [hydrocodone-acetaminophen] Nausea And Vomiting   Percocet [oxycodone-acetaminophen] Nausea And Vomiting   Tramadol Hcl Nausea And Vomiting        Medication List     STOP taking these  medications    Tylenol 325 MG Caps Generic drug: Acetaminophen       TAKE these medications    apixaban 5 MG Tabs tablet Commonly known as: ELIQUIS Take 1 tablet (5 mg total) by mouth 2 (two) times daily.   Artificial Tears PF 0.1-0.3 % Soln Generic drug: Dextran 70-Hypromellose (PF) Place 1 drop into both eyes every 8 (eight) hours as needed (for dryness).   aspirin EC 81 MG tablet Take 81 mg by mouth in the morning.   atorvastatin 40 MG tablet Commonly known as: LIPITOR Take 40 mg by mouth in the morning.   BETADINE ANTISEPTIC EX Apply 1 application  topically See admin instructions. Use as directed every other day for wound care (right heel site)   carvedilol 12.5 MG tablet Commonly known as: COREG Take 1 tablet (12.5 mg total) by mouth 2 (two) times daily with a meal.   clopidogrel 75 MG tablet Commonly known as: PLAVIX Take 75 mg by mouth daily.   cyanocobalamin 500 MCG tablet Commonly known as: VITAMIN B12 Take 500 mcg by mouth in the morning.   Entresto 49-51 MG Generic drug: sacubitril-valsartan Take 1 tablet by mouth 2 (two) times daily.   ferrous sulfate 325 (65 FE) MG tablet Take 325 mg by mouth daily with breakfast.   FreeStyle  Libre 14 Day Sensor Misc Inject 1 Device into the skin every 14 (fourteen) days.   furosemide 80 MG tablet Commonly known as: LASIX Take 1 tablet (80 mg total) by mouth in the morning. And 1 tablet (40 mg) in the evening   furosemide 40 MG tablet Commonly known as: LASIX Take 1 tablet (40 mg total) by mouth daily at 4 PM. And 1 tablet (80 mg) daily in the morning   gabapentin 800 MG tablet Commonly known as: NEURONTIN Take 800 mg by mouth at bedtime as needed (Sleep).   hydrALAZINE 10 MG tablet Commonly known as: APRESOLINE Take 1 tablet (10 mg total) by mouth 3 (three) times daily.   HYDROcodone-acetaminophen 5-325 MG tablet Commonly known as: NORCO/VICODIN Take 1 tablet by mouth every 4 (four) hours as  needed. What changed:  when to take this reasons to take this   insulin NPH-regular Human (70-30) 100 UNIT/ML injection Inject 7-8 Units into the skin See admin instructions. Inject 8 units into the skin before breakfast and 7 units before the evening meal   isosorbide mononitrate 30 MG 24 hr tablet Commonly known as: IMDUR Take 1 tablet (30 mg total) by mouth daily.   NON FORMULARY Take 16 oz by mouth See admin instructions. [employer] Signature Organic Raw Kombucha, Ginger Lemonade- Drink 16 ounces by mouth once a day   ondansetron 4 MG tablet Commonly known as: Zofran Take 1 tablet (4 mg total) by mouth every 8 (eight) hours as needed for nausea or vomiting.   polyethylene glycol 17 g packet Commonly known as: MIRALAX / GLYCOLAX Take 17 g by mouth daily. What changed:  when to take this reasons to take this   PRESCRIPTION MEDICATION Inject 0.1-1 mLs as directed See admin instructions. Tri-Mix Standard Strength 5 ml. Formula: Prostaglandin 10 mcg/ml, Papaverine 30 mg/ml, Phentolamine 1 mg/ml. Inject 0.1 ml into side of penis as directed as needed (to be injected immediately before sexual intercourse) may increase the dose by 0.1 ml every 48 hours to achieve an erection. Max dose 1 ml.   Vitamin D-3 25 MCG (1000 UT) Caps Take 1,000 Units by mouth daily.               Durable Medical Equipment  (From admission, onward)           Start     Ordered   08/02/22 0714  For home use only DME Vac  Once       Comments: Attached the wound VAC dressing to a Praveena plus portable wound VAC pump for discharge.   08/02/22 0715              Discharge Care Instructions  (From admission, onward)           Start     Ordered   08/02/22 0000  Weight bearing as tolerated       Comments: Patient may be weightbearing as tolerated on the ball of his foot for transfers.  He should wear the PRAFO 24 hours a day.  Question Answer Comment  Laterality right   Extremity Lower       08/02/22 0715            Diagnostic Studies: No results found.  Patient benefited maximally from their hospital stay and there were no complications.     Disposition:  Discharge Instructions     Weight bearing as tolerated   Complete by: As directed    Patient may be weightbearing as tolerated on  the ball of his foot for transfers.  He should wear the PRAFO 24 hours a day.   Laterality: right   Extremity: Lower       Follow-up Information     Nadara Mustard, MD Follow up in 1 week(s).   Specialty: Orthopedic Surgery Contact information: [address] [place] [address] [phone]                  Signed: Nadara Mustard 08/02/2022, 7:15 AM

## 2022-08-02 NOTE — TOC Transition Note (Signed)
Transition of Care [facility]) - CM/SW Discharge Note   Patient Details  Name: Caleb [name] MRN: [id number] Date of Birth: 09/09/1950  Transition of Care [facility]) CM/SW Contact:  Lawerance Sabal, RN Phone Number: 08/02/2022, 7:38 AM   Clinical Narrative:     Rondel Jumbo that patient will DC today. Patient will transition to [facility] prior to DC.  No other TOC needs identified for DC.   Final next level of care: Home w Home Health Services Barriers to Discharge: No Barriers Identified   Patient Goals and CMS Choice   Choice offered to / list presented to : Patient  Discharge Placement                         Discharge Plan and Services Additional resources added to the After Visit Summary for                            [facility] Arranged: PT, OT, Social Work   Date [facility] Agency Contacted: 08/02/22 Time HH Agency Contacted: [phone] Representative spoke with at [facility] Agency: Kandee Keen  Social Determinants of Health (SDOH) Interventions SDOH Screenings   Food Insecurity: No Food Insecurity (07/29/2022)  Housing: Low Risk  (07/29/2022)  Transportation Needs: No Transportation Needs (07/29/2022)  Utilities: Not At Risk (07/29/2022)  Alcohol Screen: Low Risk  (10/29/2021)  Depression (PHQ2-9): Low Risk  (05/02/2022)  Financial Resource Strain: Low Risk  (10/29/2021)  Physical Activity: Sufficiently Active (12/06/2020)  Social Connections: Moderately Isolated (12/06/2020)  Stress: No Stress Concern Present (12/06/2020)  Tobacco Use: Low Risk  (07/30/2022)     Readmission Risk Interventions     No data to display

## 2022-08-02 NOTE — Consult Note (Signed)
   [facility] Inpatient Consult   08/02/2022  LATHAN QUIROA Feb 03, 1951 [id number]  [facility] [[facility]]  Accountable Care Organization [ACO] Patient: [employer]  Medicare  Primary Care Provider:  Hoy Register, MD, [facility]  Patient is active with [facility] RN Care Coordinator.  Patient evaluated for less than 30 days readmission.  Patient evaluated for SDOH community based care coordination services with [facility] [[facility]] Care Management Program as a benefit of patient's [employer]. Met with patient at the bedside to explain [facility] Care Management ongoing services.  Patient will receive post hospital discharge call and for assessments fo community needs for care/disease management.  Patient was a bit upset regarding being asked to go to the discharge lounge when his initial ride was not able to pick him up until 5 pm however, he's tried to arrange a ride for by 11 am.  Encouraged patient to express feelings about having a wound VAC and pain control and encouraged to work with nursing staff regarding this. Assess for SDOH as patient lives alone for community.  Patient is aware of [employer] transportation and encouraged about Diabetes management and food resource for protein for healing and patient would like follow up with having this resource.   Of note, [facility] Care Management services does not replace or interfere with any services that are arranged by inpatient Transition of Care [TOC] team     For additional questions or referrals please contact:    Charlesetta Shanks, RN BSN CCM [facility]  [phone] business mobile phone Toll free office [phone]  *Concierge Line  [phone] Fax number: [phone] [email or web address]

## 2022-08-02 NOTE — Progress Notes (Addendum)
Patient ID: Caleb [name], male   DOB: 1950/07/14, 72 y.o.   MRN: [id number] Patient is alert and oriented this morning.  Patient states he feels safe to discharge to his apartment.  Patient may be weightbearing as needed on the ball of his foot for transfers.  Patient will need to transition to the [facility] plus portable wound VAC pump for discharge.  Patient had been planning to move to [place].  Anticipate he will need to be in town for at least 6 to 8 months.

## 2022-08-02 NOTE — ED Provider Notes (Signed)
[facility] AT [facility] Provider Note   CSN: [id number] Arrival date & time: 08/02/22  2253     History {Add pertinent medical, surgical, social history, OB history to HPI:1} Chief Complaint  Patient presents with   Nausea    Pt arrived via ems from home after being discharged from hospital after sx c/o nausea and weakness    Caleb [name] is a 72 y.o. male.  72 year old man with PVD, DM 2, HTN, AAS s/p TAVR, HFrEF with a EF of 35% and CKD presenting with nausea and vomiting that onset today after being discharged this morning.  States he underwent debridement of his right heel by Dr. Lajoyce Corners and was sent home with a wound VAC in place.  He began to feel ill this afternoon with ongoing episodes of nausea and vomiting and unable to keep anything down at home.  Reports blood sugar elevated over 120.  Estimates he vomited 3-4 times.  Denies diarrhea, chills, pain with urination, blood in the urine.  No fever.  No abdominal pain, chest pain or shortness of breath.  No travel or sick contacts.  Took Zofran at home without relief.  Feels generally weak, lightheaded and nauseous.  Does not take any antibiotics currently. States he has had this issue in the past but is not certain what caused it.  Does not think he had a fever.  Does not think he had any chest pain.  Denies any abdominal pain.  States compliance with his medications.  The history is provided by the patient and the EMS personnel.       Home Medications Prior to Admission medications   Medication Sig Start Date End Date Taking? Authorizing Provider  apixaban (ELIQUIS) 5 MG TABS tablet Take 1 tablet (5 mg total) by mouth 2 (two) times daily. Patient not taking: Reported on 07/25/2022 07/25/22   Hoy Register, MD  ARTIFICIAL TEARS PF 0.1-0.3 % SOLN Place 1 drop into both eyes every 8 (eight) hours as needed (for dryness).    [provider]  aspirin EC 81 MG tablet Take 81 mg by mouth in the  morning.    [provider]  atorvastatin (LIPITOR) 40 MG tablet Take 40 mg by mouth in the morning. 07/03/20   [provider]  carvedilol (COREG) 12.5 MG tablet Take 1 tablet (12.5 mg total) by mouth 2 (two) times daily with a meal. 07/25/22 08/24/22  Hoy Register, MD  Cholecalciferol (VITAMIN D-3) 25 MCG (1000 UT) CAPS Take 1,000 Units by mouth daily.    [provider]  clopidogrel (PLAVIX) 75 MG tablet Take 75 mg by mouth daily.    [provider]  Continuous Blood Gluc Sensor (FREESTYLE LIBRE 14 DAY SENSOR) MISC Inject 1 Device into the skin every 14 (fourteen) days.    [provider]  ferrous sulfate 325 (65 FE) MG tablet Take 325 mg by mouth daily with breakfast.    [provider]  furosemide (LASIX) 40 MG tablet Take 1 tablet (40 mg total) by mouth daily at 4 PM. And 1 tablet (80 mg) daily in the morning 07/25/22   Hoy Register, MD  furosemide (LASIX) 80 MG tablet Take 1 tablet (80 mg total) by mouth in the morning. And 1 tablet (40 mg) in the evening 07/25/22   Hoy Register, MD  gabapentin (NEURONTIN) 800 MG tablet Take 800 mg by mouth at bedtime as needed (Sleep). 07/03/20   [provider]  hydrALAZINE (APRESOLINE) 10  MG tablet Take 1 tablet (10 mg total) by mouth 3 (three) times daily. 07/25/22 08/24/22  Hoy Register, MD  HYDROcodone-acetaminophen (NORCO/VICODIN) 5-325 MG tablet Take 1 tablet by mouth every 4 (four) hours as needed. 08/02/22   Nadara Mustard, MD  insulin NPH-regular Human (70-30) 100 UNIT/ML injection Inject 7-8 Units into the skin See admin instructions. Inject 8 units into the skin before breakfast and 7 units before the evening meal    [provider]  isosorbide mononitrate (IMDUR) 30 MG 24 hr tablet Take 1 tablet (30 mg total) by mouth daily. 01/13/22 01/13/23  Janetta Hora, PA-C  NON FORMULARY Take 16 oz by mouth See admin instructions. [employer] Signature Organic Raw Kombucha, Ginger  Lemonade- Drink 16 ounces by mouth once a day    [provider]  ondansetron (ZOFRAN) 4 MG tablet Take 1 tablet (4 mg total) by mouth every 8 (eight) hours as needed for nausea or vomiting. 07/25/22   Hoy Register, MD  polyethylene glycol (MIRALAX / GLYCOLAX) 17 g packet Take 17 g by mouth daily. Patient taking differently: Take 17 g by mouth daily as needed for mild constipation. 06/02/22   Osvaldo Shipper, MD  Povidone-Iodine (BETADINE ANTISEPTIC EX) Apply 1 application  topically See admin instructions. Use as directed every other day for wound care (right heel site)    [provider]  PRESCRIPTION MEDICATION Inject 0.1-1 mLs as directed See admin instructions. Tri-Mix Standard Strength 5 ml. Formula: Prostaglandin 10 mcg/ml, Papaverine 30 mg/ml, Phentolamine 1 mg/ml. Inject 0.1 ml into side of penis as directed as needed (to be injected immediately before sexual intercourse) may increase the dose by 0.1 ml every 48 hours to achieve an erection. Max dose 1 ml.    [provider]  sacubitril-valsartan (ENTRESTO) 49-51 MG Take 1 tablet by mouth 2 (two) times daily. 03/07/22   Sabharwal, Aditya, DO  vitamin B-12 (CYANOCOBALAMIN) 500 MCG tablet Take 500 mcg by mouth in the morning. 08/24/21   [provider]      Allergies    Codeine, Norco [hydrocodone-acetaminophen], Percocet [oxycodone-acetaminophen], and Tramadol hcl    Review of Systems   Review of Systems  Constitutional:  Positive for activity change and appetite change. Negative for fever.  HENT:  Negative for congestion and rhinorrhea.   Respiratory:  Negative for cough, chest tightness and shortness of breath.   Cardiovascular:  Negative for chest pain.  Gastrointestinal:  Positive for nausea and vomiting.  Genitourinary:  Negative for dysuria and hematuria.  Musculoskeletal:  Positive for arthralgias and myalgias.  Skin:  Negative for rash.  Neurological:  Positive for weakness. Negative for  dizziness and headaches.   all other systems are negative except as noted in the HPI and PMH.     Physical Exam Updated Vital Signs BP 127/75 (BP Location: Right Arm)   Pulse (!) 108   Temp 99.3 F (37.4 C) (Oral)   Resp 18   Ht 5\' 7"  (1.702 m)   Wt 73 kg   SpO2 100%   BMI 25.21 kg/m  Physical Exam Vitals and nursing note reviewed.  Constitutional:      General: He is not in acute distress.    Appearance: He is well-developed.  HENT:     Head: Normocephalic and atraumatic.     Mouth/Throat:     Mouth: Mucous membranes are dry.     Pharynx: No oropharyngeal exudate.  Eyes:     Conjunctiva/sclera: Conjunctivae normal.     Pupils:  Pupils are equal, round, and reactive to light.  Neck:     Comments: No meningismus. Cardiovascular:     Rate and Rhythm: Normal rate and regular rhythm.     Heart sounds: Normal heart sounds. No murmur heard. Pulmonary:     Effort: Pulmonary effort is normal. No respiratory distress.     Breath sounds: Normal breath sounds.  Abdominal:     Palpations: Abdomen is soft.     Tenderness: There is no abdominal tenderness. There is no guarding or rebound.  Musculoskeletal:        General: Tenderness present. Normal range of motion.     Cervical back: Normal range of motion and neck supple.     Comments: Surgical dressing in place to right foot.  Wound VAC in place.  Palpable DP pulse.  Able to wiggle toes  Skin:    General: Skin is warm.  Neurological:     Mental Status: He is alert and oriented to person, place, and time.     Cranial Nerves: No cranial nerve deficit.     Motor: No abnormal muscle tone.     Coordination: Coordination normal.     Comments: No ataxia on finger to nose bilaterally. No pronator drift. 5/5 strength throughout. CN 2-12 intact.Equal grip strength. Sensation intact.   Psychiatric:        Behavior: Behavior normal.     ED Results / Procedures / Treatments   Labs (all labs ordered are listed, but only abnormal  results are displayed) Labs Reviewed  CBG MONITORING, ED - Abnormal; Notable for the following components:      Result Value   Glucose-Capillary 180 (*)    All other components within normal limits  CBC WITH DIFFERENTIAL/PLATELET  COMPREHENSIVE METABOLIC PANEL  LACTIC ACID, PLASMA  LACTIC ACID, PLASMA  URINALYSIS, ROUTINE W REFLEX MICROSCOPIC  LIPASE, BLOOD  I-STAT VENOUS BLOOD GAS, ED  TROPONIN I (HIGH SENSITIVITY)    EKG None  Radiology No results found.  Procedures Procedures  {Document cardiac monitor, telemetry assessment procedure when appropriate:1}  Medications Ordered in ED Medications  lactated ringers bolus 1,000 mL (has no administration in time range)    ED Course/ Medical Decision Making/ A&P   {   Click here for ABCD2, HEART and other calculatorsREFRESH Note before signing :1}                          Medical Decision Making Amount and/or Complexity of Data Reviewed Labs: ordered. Decision-making details documented in ED Course. Radiology: ordered and independent interpretation performed. Decision-making details documented in ED Course. ECG/medicine tests: ordered and independent interpretation performed. Decision-making details documented in ED Course.   Nausea and vomiting with hyperglycemia.  Recent debridement of right foot.  Vitals are stable, mild tachycardia.  Abdomen soft without peritoneal signs.  No chest pain or shortness of breath.  Will hydrate, treat symptoms, check labs, rule out DKA.  {Document critical care time when appropriate:1} {Document review of labs and clinical decision tools ie heart score, Chads2Vasc2 etc:1}  {Document your independent review of radiology images, and any outside records:1} {Document your discussion with family members, caretakers, and with consultants:1} {Document social determinants of health affecting pt's care:1} {Document your decision making why or why not admission, treatments were needed:1} Final  Clinical Impression(s) / ED Diagnoses Final diagnoses:  None    Rx / DC Orders ED Discharge Orders     None

## 2022-08-03 ENCOUNTER — Emergency Department (HOSPITAL_COMMUNITY): Payer: Medicare HMO

## 2022-08-03 ENCOUNTER — Telehealth: Payer: Self-pay

## 2022-08-03 ENCOUNTER — Telehealth: Payer: Self-pay | Admitting: Orthopedic Surgery

## 2022-08-03 DIAGNOSIS — T85698A Other mechanical complication of other specified internal prosthetic devices, implants and grafts, initial encounter: Secondary | ICD-10-CM | POA: Diagnosis not present

## 2022-08-03 DIAGNOSIS — I5020 Unspecified systolic (congestive) heart failure: Secondary | ICD-10-CM | POA: Diagnosis present

## 2022-08-03 DIAGNOSIS — R4182 Altered mental status, unspecified: Secondary | ICD-10-CM | POA: Diagnosis not present

## 2022-08-03 DIAGNOSIS — E1169 Type 2 diabetes mellitus with other specified complication: Secondary | ICD-10-CM

## 2022-08-03 DIAGNOSIS — R112 Nausea with vomiting, unspecified: Secondary | ICD-10-CM

## 2022-08-03 DIAGNOSIS — K2900 Acute gastritis without bleeding: Secondary | ICD-10-CM | POA: Diagnosis not present

## 2022-08-03 DIAGNOSIS — I130 Hypertensive heart and chronic kidney disease with heart failure and stage 1 through stage 4 chronic kidney disease, or unspecified chronic kidney disease: Secondary | ICD-10-CM | POA: Diagnosis not present

## 2022-08-03 DIAGNOSIS — Z8739 Personal history of other diseases of the musculoskeletal system and connective tissue: Secondary | ICD-10-CM

## 2022-08-03 DIAGNOSIS — Z7901 Long term (current) use of anticoagulants: Secondary | ICD-10-CM | POA: Diagnosis not present

## 2022-08-03 DIAGNOSIS — R2689 Other abnormalities of gait and mobility: Secondary | ICD-10-CM | POA: Diagnosis not present

## 2022-08-03 DIAGNOSIS — D649 Anemia, unspecified: Secondary | ICD-10-CM | POA: Diagnosis not present

## 2022-08-03 DIAGNOSIS — R109 Unspecified abdominal pain: Secondary | ICD-10-CM | POA: Diagnosis not present

## 2022-08-03 DIAGNOSIS — E875 Hyperkalemia: Secondary | ICD-10-CM

## 2022-08-03 DIAGNOSIS — Z4689 Encounter for fitting and adjustment of other specified devices: Secondary | ICD-10-CM

## 2022-08-03 DIAGNOSIS — I10 Essential (primary) hypertension: Secondary | ICD-10-CM | POA: Diagnosis not present

## 2022-08-03 DIAGNOSIS — Z7982 Long term (current) use of aspirin: Secondary | ICD-10-CM | POA: Diagnosis not present

## 2022-08-03 DIAGNOSIS — E785 Hyperlipidemia, unspecified: Secondary | ICD-10-CM

## 2022-08-03 DIAGNOSIS — Z952 Presence of prosthetic heart valve: Secondary | ICD-10-CM | POA: Diagnosis not present

## 2022-08-03 DIAGNOSIS — D638 Anemia in other chronic diseases classified elsewhere: Secondary | ICD-10-CM | POA: Diagnosis present

## 2022-08-03 DIAGNOSIS — I739 Peripheral vascular disease, unspecified: Secondary | ICD-10-CM

## 2022-08-03 DIAGNOSIS — D72829 Elevated white blood cell count, unspecified: Secondary | ICD-10-CM | POA: Diagnosis not present

## 2022-08-03 DIAGNOSIS — R2681 Unsteadiness on feet: Secondary | ICD-10-CM | POA: Diagnosis not present

## 2022-08-03 DIAGNOSIS — N183 Chronic kidney disease, stage 3 unspecified: Secondary | ICD-10-CM

## 2022-08-03 DIAGNOSIS — N1831 Chronic kidney disease, stage 3a: Secondary | ICD-10-CM

## 2022-08-03 DIAGNOSIS — M6281 Muscle weakness (generalized): Secondary | ICD-10-CM | POA: Diagnosis not present

## 2022-08-03 DIAGNOSIS — I700 Atherosclerosis of aorta: Secondary | ICD-10-CM | POA: Diagnosis not present

## 2022-08-03 DIAGNOSIS — E1122 Type 2 diabetes mellitus with diabetic chronic kidney disease: Secondary | ICD-10-CM | POA: Diagnosis not present

## 2022-08-03 LAB — BPAM RBC
Blood Product Expiration Date: 202406162359
Unit Type and Rh: 5100

## 2022-08-03 LAB — URINALYSIS, ROUTINE W REFLEX MICROSCOPIC
Bacteria, UA: NONE SEEN
Bilirubin Urine: NEGATIVE
Glucose, UA: >=500 mg/dL — AB
Hgb urine dipstick: NEGATIVE
Ketones, ur: NEGATIVE mg/dL
Leukocytes,Ua: NEGATIVE
Nitrite: NEGATIVE
Protein, ur: 30 mg/dL — AB
Specific Gravity, Urine: 1.011 (ref 1.005–1.030)
pH: 5 (ref 5.0–8.0)

## 2022-08-03 LAB — CBC WITH DIFFERENTIAL/PLATELET
Abs Immature Granulocytes: 0.03 10*3/uL (ref 0.00–0.07)
Basophils Absolute: 0 10*3/uL (ref 0.0–0.1)
Basophils Relative: 0 %
Eosinophils Absolute: 0.1 10*3/uL (ref 0.0–0.5)
Eosinophils Relative: 1 %
HCT: 28.9 % — ABNORMAL LOW (ref 39.0–52.0)
Hemoglobin: 9.4 g/dL — ABNORMAL LOW (ref 13.0–17.0)
Immature Granulocytes: 0 %
Lymphocytes Relative: 13 %
Lymphs Abs: 1.4 10*3/uL (ref 0.7–4.0)
MCH: 27.2 pg (ref 26.0–34.0)
MCHC: 32.5 g/dL (ref 30.0–36.0)
MCV: 83.5 fL (ref 80.0–100.0)
Monocytes Absolute: 0.8 10*3/uL (ref 0.1–1.0)
Monocytes Relative: 7 %
Neutro Abs: 8.4 10*3/uL — ABNORMAL HIGH (ref 1.7–7.7)
Neutrophils Relative %: 79 %
Platelets: 405 10*3/uL — ABNORMAL HIGH (ref 150–400)
RBC: 3.46 MIL/uL — ABNORMAL LOW (ref 4.22–5.81)
RDW: 16.7 % — ABNORMAL HIGH (ref 11.5–15.5)
WBC: 10.7 10*3/uL — ABNORMAL HIGH (ref 4.0–10.5)
nRBC: 0 % (ref 0.0–0.2)

## 2022-08-03 LAB — I-STAT VENOUS BLOOD GAS, ED
Acid-Base Excess: 4 mmol/L — ABNORMAL HIGH (ref 0.0–2.0)
Bicarbonate: 29.2 mmol/L — ABNORMAL HIGH (ref 20.0–28.0)
Calcium, Ion: 1.1 mmol/L — ABNORMAL LOW (ref 1.15–1.40)
HCT: 30 % — ABNORMAL LOW (ref 39.0–52.0)
Hemoglobin: 10.2 g/dL — ABNORMAL LOW (ref 13.0–17.0)
O2 Saturation: 97 %
Potassium: 6.3 mmol/L (ref 3.5–5.1)
Sodium: 129 mmol/L — ABNORMAL LOW (ref 135–145)
TCO2: 31 mmol/L (ref 22–32)
pCO2, Ven: 46.9 mmHg (ref 44–60)
pH, Ven: 7.403 (ref 7.25–7.43)
pO2, Ven: 97 mmHg — ABNORMAL HIGH (ref 32–45)

## 2022-08-03 LAB — COMPREHENSIVE METABOLIC PANEL
ALT: 19 U/L (ref 0–44)
AST: 29 U/L (ref 15–41)
Albumin: 2.8 g/dL — ABNORMAL LOW (ref 3.5–5.0)
Alkaline Phosphatase: 111 U/L (ref 38–126)
Anion gap: 11 (ref 5–15)
BUN: 54 mg/dL — ABNORMAL HIGH (ref 8–23)
CO2: 26 mmol/L (ref 22–32)
Calcium: 9 mg/dL (ref 8.9–10.3)
Chloride: 92 mmol/L — ABNORMAL LOW (ref 98–111)
Creatinine, Ser: 1.69 mg/dL — ABNORMAL HIGH (ref 0.61–1.24)
GFR, Estimated: 43 mL/min — ABNORMAL LOW (ref >60)
Glucose, Bld: 178 mg/dL — ABNORMAL HIGH (ref 70–99)
Potassium: 6.3 mmol/L (ref 3.5–5.1)
Sodium: 129 mmol/L — ABNORMAL LOW (ref 135–145)
Total Bilirubin: 1 mg/dL (ref 0.3–1.2)
Total Protein: 8.1 g/dL (ref 6.5–8.1)

## 2022-08-03 LAB — BASIC METABOLIC PANEL
Anion gap: 12 (ref 5–15)
BUN: 47 mg/dL — ABNORMAL HIGH (ref 8–23)
CO2: 24 mmol/L (ref 22–32)
Calcium: 8.5 mg/dL — ABNORMAL LOW (ref 8.9–10.3)
Chloride: 95 mmol/L — ABNORMAL LOW (ref 98–111)
Creatinine, Ser: 1.51 mg/dL — ABNORMAL HIGH (ref 0.61–1.24)
GFR, Estimated: 49 mL/min — ABNORMAL LOW (ref >60)
Glucose, Bld: 144 mg/dL — ABNORMAL HIGH (ref 70–99)
Potassium: 4.6 mmol/L (ref 3.5–5.1)
Sodium: 131 mmol/L — ABNORMAL LOW (ref 135–145)

## 2022-08-03 LAB — CBC
HCT: 24 % — ABNORMAL LOW (ref 39.0–52.0)
Hemoglobin: 7.8 g/dL — ABNORMAL LOW (ref 13.0–17.0)
MCH: 26.5 pg (ref 26.0–34.0)
MCHC: 32.5 g/dL (ref 30.0–36.0)
MCV: 81.6 fL (ref 80.0–100.0)
Platelets: 359 10*3/uL (ref 150–400)
RBC: 2.94 MIL/uL — ABNORMAL LOW (ref 4.22–5.81)
RDW: 16.8 % — ABNORMAL HIGH (ref 11.5–15.5)
WBC: 9.4 10*3/uL (ref 4.0–10.5)
nRBC: 0 % (ref 0.0–0.2)

## 2022-08-03 LAB — GLUCOSE, CAPILLARY
Glucose-Capillary: 188 mg/dL — ABNORMAL HIGH (ref 70–99)
Glucose-Capillary: 201 mg/dL — ABNORMAL HIGH (ref 70–99)
Glucose-Capillary: 146 mg/dL — ABNORMAL HIGH (ref 70–99)

## 2022-08-03 LAB — HEMOGLOBIN AND HEMATOCRIT, BLOOD
HCT: 26.4 % — ABNORMAL LOW (ref 39.0–52.0)
Hemoglobin: 8.5 g/dL — ABNORMAL LOW (ref 13.0–17.0)

## 2022-08-03 LAB — PREPARE RBC (CROSSMATCH)

## 2022-08-03 LAB — BETA-HYDROXYBUTYRIC ACID: Beta-Hydroxybutyric Acid: 0.53 mmol/L — ABNORMAL HIGH (ref 0.05–0.27)

## 2022-08-03 LAB — TYPE AND SCREEN: ABO/RH(D): O POS

## 2022-08-03 LAB — LACTIC ACID, PLASMA
Lactic Acid, Venous: 1.1 mmol/L (ref 0.5–1.9)
Lactic Acid, Venous: 1.1 mmol/L (ref 0.5–1.9)

## 2022-08-03 LAB — TROPONIN I (HIGH SENSITIVITY)
Troponin I (High Sensitivity): 36 ng/L — ABNORMAL HIGH (ref <18)
Troponin I (High Sensitivity): 37 ng/L — ABNORMAL HIGH (ref <18)

## 2022-08-03 LAB — LIPASE, BLOOD: Lipase: 31 U/L (ref 11–51)

## 2022-08-03 LAB — POTASSIUM: Potassium: 4.8 mmol/L (ref 3.5–5.1)

## 2022-08-03 MED ORDER — ASPIRIN 81 MG PO TBEC
81.0000 mg | DELAYED_RELEASE_TABLET | Freq: Every day | ORAL | Status: DC
  Administered 2022-08-03 – 2022-08-04 (×2): 81 mg via ORAL
  Filled 2022-08-03 (×2): qty 1

## 2022-08-03 MED ORDER — SODIUM CHLORIDE 0.9% FLUSH
3.0000 mL | Freq: Two times a day (BID) | INTRAVENOUS | Status: DC
  Administered 2022-08-03 – 2022-08-04 (×2): 3 mL via INTRAVENOUS

## 2022-08-03 MED ORDER — ALBUTEROL SULFATE (2.5 MG/3ML) 0.083% IN NEBU: 2.5000 mg | INHALATION_SOLUTION | RESPIRATORY_TRACT | Status: DC | PRN

## 2022-08-03 MED ORDER — ONDANSETRON HCL 4 MG/2ML IJ SOLN: 4.0000 mg | Freq: Four times a day (QID) | INTRAMUSCULAR | Status: DC | PRN

## 2022-08-03 MED ORDER — ISOSORBIDE MONONITRATE ER 30 MG PO TB24
30.0000 mg | ORAL_TABLET | Freq: Every day | ORAL | Status: DC
  Administered 2022-08-04: 30 mg via ORAL
  Filled 2022-08-03 (×2): qty 1

## 2022-08-03 MED ORDER — HYDRALAZINE HCL 10 MG PO TABS
10.0000 mg | ORAL_TABLET | Freq: Three times a day (TID) | ORAL | Status: DC
  Administered 2022-08-03 – 2022-08-04 (×2): 10 mg via ORAL
  Filled 2022-08-03 (×5): qty 1

## 2022-08-03 MED ORDER — INSULIN ASPART 100 UNIT/ML IJ SOLN: 0.0000 [IU] | Freq: Every day | INTRAMUSCULAR | Status: DC

## 2022-08-03 MED ORDER — MORPHINE SULFATE (PF) 2 MG/ML IV SOLN
2.0000 mg | INTRAVENOUS | Status: DC | PRN
  Administered 2022-08-03 – 2022-08-04 (×6): 2 mg via INTRAVENOUS
  Filled 2022-08-03 (×6): qty 1

## 2022-08-03 MED ORDER — CALCIUM GLUCONATE 10 % IV SOLN
1.0000 g | Freq: Once | INTRAVENOUS | Status: AC
  Administered 2022-08-03: 1 g via INTRAVENOUS
  Filled 2022-08-03: qty 10

## 2022-08-03 MED ORDER — FUROSEMIDE 40 MG PO TABS
40.0000 mg | ORAL_TABLET | Freq: Every day | ORAL | Status: DC
  Administered 2022-08-03 – 2022-08-04 (×2): 40 mg via ORAL
  Filled 2022-08-03 (×2): qty 1

## 2022-08-03 MED ORDER — SODIUM CHLORIDE 0.9% IV SOLUTION: Freq: Once | INTRAVENOUS | Status: DC

## 2022-08-03 MED ORDER — FERROUS SULFATE 325 (65 FE) MG PO TABS
325.0000 mg | ORAL_TABLET | Freq: Every day | ORAL | Status: DC
  Administered 2022-08-04: 325 mg via ORAL
  Filled 2022-08-03: qty 1

## 2022-08-03 MED ORDER — TRAZODONE HCL 50 MG PO TABS
25.0000 mg | ORAL_TABLET | Freq: Every evening | ORAL | Status: DC | PRN
  Administered 2022-08-03: 25 mg via ORAL
  Filled 2022-08-03: qty 1

## 2022-08-03 MED ORDER — ATORVASTATIN CALCIUM 40 MG PO TABS
40.0000 mg | ORAL_TABLET | Freq: Every day | ORAL | Status: DC
  Administered 2022-08-03 – 2022-08-04 (×2): 40 mg via ORAL
  Filled 2022-08-03 (×2): qty 1

## 2022-08-03 MED ORDER — ACETAMINOPHEN 650 MG RE SUPP: 650.0000 mg | Freq: Four times a day (QID) | RECTAL | Status: DC | PRN

## 2022-08-03 MED ORDER — SODIUM ZIRCONIUM CYCLOSILICATE 10 G PO PACK
10.0000 g | PACK | Freq: Once | ORAL | Status: AC
  Administered 2022-08-03: 10 g via ORAL
  Filled 2022-08-03: qty 1

## 2022-08-03 MED ORDER — PANTOPRAZOLE SODIUM 40 MG IV SOLR
40.0000 mg | Freq: Two times a day (BID) | INTRAVENOUS | Status: DC
  Administered 2022-08-03 – 2022-08-04 (×3): 40 mg via INTRAVENOUS
  Filled 2022-08-03 (×3): qty 10

## 2022-08-03 MED ORDER — CLOPIDOGREL BISULFATE 75 MG PO TABS
75.0000 mg | ORAL_TABLET | Freq: Every day | ORAL | Status: DC
  Administered 2022-08-03 – 2022-08-04 (×2): 75 mg via ORAL
  Filled 2022-08-03 (×2): qty 1

## 2022-08-03 MED ORDER — CARVEDILOL 12.5 MG PO TABS
12.5000 mg | ORAL_TABLET | Freq: Two times a day (BID) | ORAL | Status: DC
  Administered 2022-08-03 – 2022-08-04 (×4): 12.5 mg via ORAL
  Filled 2022-08-03 (×4): qty 1

## 2022-08-03 MED ORDER — SODIUM CHLORIDE 0.9 % IV SOLN: INTRAVENOUS | Status: DC

## 2022-08-03 MED ORDER — INSULIN ASPART 100 UNIT/ML IJ SOLN
0.0000 [IU] | Freq: Three times a day (TID) | INTRAMUSCULAR | Status: DC
  Administered 2022-08-03: 5 [IU] via SUBCUTANEOUS
  Administered 2022-08-03: 3 [IU] via SUBCUTANEOUS
  Administered 2022-08-04 (×2): 2 [IU] via SUBCUTANEOUS
  Administered 2022-08-04: 3 [IU] via SUBCUTANEOUS

## 2022-08-03 MED ORDER — ACETAMINOPHEN 325 MG PO TABS
650.0000 mg | ORAL_TABLET | Freq: Four times a day (QID) | ORAL | Status: DC | PRN
  Administered 2022-08-03 (×2): 650 mg via ORAL
  Filled 2022-08-03 (×2): qty 2

## 2022-08-03 MED ORDER — IOHEXOL 350 MG/ML SOLN
75.0000 mL | Freq: Once | INTRAVENOUS | Status: AC | PRN
  Administered 2022-08-03: 75 mL via INTRAVENOUS

## 2022-08-03 MED ORDER — SACUBITRIL-VALSARTAN 49-51 MG PO TABS
1.0000 | ORAL_TABLET | Freq: Two times a day (BID) | ORAL | Status: DC
  Administered 2022-08-03 – 2022-08-04 (×2): 1 via ORAL
  Filled 2022-08-03 (×3): qty 1

## 2022-08-03 MED ORDER — ONDANSETRON HCL 4 MG PO TABS
4.0000 mg | ORAL_TABLET | Freq: Four times a day (QID) | ORAL | Status: DC | PRN
  Administered 2022-08-03 – 2022-08-04 (×2): 4 mg via ORAL
  Filled 2022-08-03 (×2): qty 1

## 2022-08-03 MED ORDER — SODIUM CHLORIDE 0.9 % IV SOLN: INTRAVENOUS | Status: AC

## 2022-08-03 MED ORDER — ENOXAPARIN SODIUM 40 MG/0.4ML IJ SOSY
40.0000 mg | PREFILLED_SYRINGE | INTRAMUSCULAR | Status: DC
  Administered 2022-08-03: 40 mg via SUBCUTANEOUS
  Filled 2022-08-03: qty 0.4

## 2022-08-03 NOTE — Progress Notes (Signed)
Patient ID: Caleb [name], male   DOB: 09/02/1950, 72 y.o.   MRN: [id number] Patient is postoperative day 5 status post tissue graft and partial calcaneal excision right foot for limb salvage intervention.  Patient states that he had altered mental status at home and called 911.  The Prevena wound VAC is showing a blockage.  There is hematoma beneath the derma tack dressing.  The Dermatex dressing is removed there is extensive maceration from the new wound VAC sponge.  There is not integration of the soft tissue graft.  A dry dressing was applied.  Patient will follow-up in the office on Monday or Tuesday.  I will order a new PRAFO boot he will continue with minimizing weightbearing on the right lower extremity.

## 2022-08-03 NOTE — Consult Note (Signed)
WOC Nurse Consult Note: Reason for Consult:Right partial calcaneous excision on 07/29/22 with application of Kerecis.  Back today after Prevena VAC was alarming and not functioning.  I have notified Dr Lajoyce Corners of patient admission.  Wound type:surgical 07/29/22 with incisional (PREVENA) vac in place.  Pressure Injury POA: NA Measurement:not assessed Wound ZOX:WRUEAV to assess Drainage (amount, consistency, odor) I remove ace wrap and note Heavy buildup of coagulated blood matter under dressing along surgical line.  Clotting in VAC tubing and device is alarming.  Patient had not removed the ace and was not aware.  Periwound:covered with drape and coagulated blood Dressing procedure/placement/frequency: Dr Lajoyce Corners notified and will see wound later today.  I have ordered VAC device, canister and 2 medium foam dressing kits.  I have placed supplied in room for NS moist gauze dressing.  Due to likelihood of bleeding, I am leaving this dressing inplace until Dr Lajoyce Corners can assess.  Bedside RN and Dr Lajoyce Corners aware.   Patient aware as well.  Will follow as needed.  Mike Gip MSN, RN, FNP-BC CWON Wound, Ostomy, Continence Nurse Outpatient [facility] [phone] Pager [phone]

## 2022-08-03 NOTE — Progress Notes (Signed)
Orthopedic Tech Progress Note Patient Details:  Caleb [name] 10/31/1950 [id number]  Ortho Devices Type of Ortho Device: Prafo boot/shoe Ortho Device/Splint Location: RLE Ortho Device/Splint Interventions: Ordered, Application, Adjustment   Post Interventions Patient Tolerated: Well  [name] 08/03/2022, 7:55 PM

## 2022-08-03 NOTE — Telephone Encounter (Signed)
   Telephone encounter was:  Successful.  08/03/2022 Name: NICCOLAS STANSEL MRN: [id number] DOB: 19-Oct-1950  [name] is a 72 y.o. year old male who is a primary care patient of Hoy Register, MD . The community resource team was consulted for assistance with  post discharge meals.  Care guide performed the following interventions: Made three-way call to [employer] with patient to verify meal benefit.  I was not aware patient had been admitted to the hospital today. He expects to be discharged tomorrow.  Follow Up Plan:   I will follow-up with him a make a three way call to [employer] to setup meals.  Letisha Yera [facility]  [facility] Population Health Community Resource Care Guide   [email or web address]  ?? [phone]   Website: [email or web address]  [email or web address]

## 2022-08-03 NOTE — Telephone Encounter (Signed)
Pt called stating he had surgery 5/10 and stated he called 911 because his wound vac stop working and that he felt disoriented. He wanted to notify Dr Lajoyce Corners is is admitted in [facility] Rm 2W 16. Please call pt and he is asking for Dr Lajoyce Corners to look at his chart and come see him. Pt phone number is [phone].

## 2022-08-03 NOTE — ED Notes (Signed)
ED TO INPATIENT HANDOFF REPORT  ED Nurse Name and Phone #: Virgilio Belling Name/Age/Gender Caleb [name] 72 y.o. male Room/Bed: 043C/043C  Code Status   Code Status: Full Code  Home/SNF/Other Home Patient oriented to: self, place, time, and situation Is this baseline? Yes   Triage Complete: Triage complete  Chief Complaint Intractable nausea and vomiting [R11.2]  Triage Note No notes on file   Allergies Allergies  Allergen Reactions   Codeine Nausea And Vomiting   Percocet [Oxycodone-Acetaminophen] Nausea And Vomiting   Tramadol Hcl Nausea And Vomiting    Level of Care/Admitting Diagnosis ED Disposition     ED Disposition  Admit   Condition  --   Comment  Hospital Area: [facility] [[id number]]  Level of Care: Telemetry Medical [104]  May place patient in observation at [facility] or [facility] if equivalent level of care is available:: No  Covid Evaluation: Asymptomatic - no recent exposure (last 10 days) testing not required  Diagnosis: Intractable nausea and vomiting [720114]  Admitting Physician: Hannah Beat [[id number]]  Attending Physician: Hannah Beat [[id number]]          B Medical/Surgery History Past Medical History:  Diagnosis Date   Carotid artery occlusion    CHF (congestive heart failure) (HCC)    Diabetes mellitus without complication (HCC)    Type II   Dyslipidemia 09/27/2012   Erectile dysfunction 09/27/2012   GERD (gastroesophageal reflux disease)    Hypercholesteremia    Hyperlipidemia 08/12/2012   Hypertension    Hyponatremia 08/14/2012   MGUS (monoclonal gammopathy of unknown significance)    Neuropathy    Pancreatitis    Pancreatitis, acute 09/27/2012   Peripheral vascular disease (HCC)    S/P 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 Excell Seltzer & Bartle   Severe aortic stenosis    Vitamin D deficiency 06/23/2015   Past Surgical  History:  Procedure Laterality Date   ABDOMINAL AORTOGRAM W/LOWER EXTREMITY N/A 08/08/2019   Procedure: ABDOMINAL AORTOGRAM W/LOWER EXTREMITY;  Surgeon: Runell Gess, MD;  [facility]: [facility] INVASIVE CV LAB;  Service: Cardiovascular;  Laterality: N/A;   ABDOMINAL AORTOGRAM W/LOWER EXTREMITY N/A 11/18/2019   Procedure: ABDOMINAL AORTOGRAM W/LOWER EXTREMITY;  Surgeon: Runell Gess, MD;  [facility]: [facility] IN[facility] LAB;  Service: Cardiovascular;  Laterality: N/A;   ABDOMINAL AORTOGRAM W/LOWER EXTREMITY N/A 06/13/2022   Procedure: ABDOMINAL AORTOGRAM W/LOWER EXTREMITY;  Surgeon: Chuck Hint, MD;  Location: [facility] INVASIVE CV LAB;  Service: Cardiovascular;  Laterality: N/A;   APPLICATION OF WOUND VAC  06/24/2022   Procedure: APPLICATION OF WOUND VAC;  Surgeon: Nadara Mustard, MD;  Location: [facility] OR;  Service: Orthopedics;;   CARDIAC CATHETERIZATION     COLONOSCOPY  12 years ago   in [place] clinic= normal exam per pt   ENDARTERECTOMY FEMORAL Right 06/17/2022   Procedure: SUPERFICIAL ENDARTERECTOMY OF COMMON FEMORAL ARTERY;  Surgeon: Victorino Sparrow, MD;  Location: [facility] OR;  Service: Vascular;  Laterality: Right;   FEMORAL-TIBIAL BYPASS GRAFT Right 06/17/2022   Procedure: RIGHT FEMORAL- ANTERIOR TIBIAL ARTERY BYPASS;  Surgeon: Victorino Sparrow, MD;  Location: [facility] OR;  Service: Vascular;  Laterality: Right;   I & D EXTREMITY Right 06/24/2022   Procedure: RIGHT PARTIAL CALCANEUS EXCISION;  Surgeon: Nadara Mustard, MD;  Location[facility] OR;  Service: Orthopedics;  Laterality: Right;   I & D EXTREMITY Right 07/29/2022  Procedure: RIGHT PARTIAL CALCANEUS EXCISION;  Surgeon: Nadara Mustard, MD;  Location: [facility] OR;  Service: Orthopedics;  Laterality: Right;   INTRAOPERATIVE TRANSTHORACIC ECHOCARDIOGRAM N/A 01/11/2022   Procedure: INTRAOPERATIVE TRANSTHORACIC ECHOCARDIOGRAM;  Surgeon: Tonny Bollman, MD;  Location: [facility] INVASIVE CV LAB;  Service: Open Heart Surgery;  Laterality: N/A;   KNEE SURGERY     MULTIPLE  EXTRACTIONS WITH ALVEOLOPLASTY N/A 12/09/2021   Procedure: MULTIPLE EXTRACTION;  Surgeon: Sharman Cheek, DMD;  [facility] OR;  Service: Dentistry;  Laterality: N/A;   PERIPHERAL VASCULAR INTERVENTION Right 08/08/2019   Procedure: PERIPHERAL VASCULAR INTERVENTION;  Surgeon: Runell Gess, MD;  [facility];  Service: Cardiovascular;  Laterality: Right;   PERIPHERAL VASCULAR INTERVENTION Left 11/18/2019   Procedure: PERIPHERAL VASCULAR INTERVENTION;  Surgeon: Runell Gess, MD;  [facility];  Service: Cardiovascular;  Laterality: Left;  left popliteal artery   RIGHT/LEFT HEART CATH AND CORONARY ANGIOGRAPHY N/A 10/29/2021   Procedure: RIGHT/LEFT HEART CATH AND CORONARY ANGIOGRAPHY;  Surgeon: Kathleene Hazel, MD;  [facility];  Service: Cardiovascular;  Laterality: N/A;   TRANSCAROTID ARTERY REVASCULARIZATION  Left 12/16/2019   Procedure: TRANSCAROTID ARTERY REVASCULARIZATION;  Surgeon: Sherren Kerns, MD;  Location: [facility] OR;  Service: Vascular;  Laterality: Left;   TRANSCAROTID ARTERY REVASCULARIZATION  Right 02/03/2020   Procedure: RIGHT TRANSCAROTID ARTERY REVASCULARIZATION;  Surgeon: Sherren Kerns, MD;  Location: [facility] OR;  Service: Vascular;  Laterality: Right;   TRANSCATHETER AORTIC VALVE REPLACEMENT, TRANSFEMORAL N/A 01/11/2022   Procedure: Transcatheter Aortic Valve Replacement, Transfemoral;  Surgeon: Tonny Bollman, MD;  Location: [facility] INVASIVE CV LAB;  Service: Open Heart Surgery;  Laterality: N/A;   ULTRASOUND GUIDANCE FOR VASCULAR ACCESS Right 12/16/2019   Procedure: ULTRASOUND GUIDANCE FOR VASCULAR ACCESS;  Surgeon: Sherren Kerns, MD;  Location: [facility] OR;  Service: Vascular;  Laterality: Right;   ULTRASOUND GUIDANCE FOR VASCULAR ACCESS Right 02/03/2020   Procedure: ULTRASOUND GUIDANCE FOR VASCULAR ACCESS;  Surgeon: Sherren Kerns, MD;  Location: [facility] OR;  Service: Vascular;  Laterality: Right;   VASECTOMY      VEIN HARVEST Right 06/17/2022   Procedure: IPSILATERAL RIGHT GREATER SAPHENOUS VEIN HARVEST;  Surgeon: Victorino Sparrow, MD;  Location: [facility] OR;  Service: Vascular;  Laterality: Right;     A IV Location/Drains/Wounds Patient Lines/Drains/Airways Status     Active Line/Drains/Airways     Name Placement date Placement time Site Days   Peripheral IV 08/02/22 20 G Anterior;Left Forearm 08/02/22  --  Forearm  1   Peripheral IV 08/02/22 18 G 1.16" Anterior;Distal;Left;Upper Arm 08/02/22  2340  Arm  1   Negative Pressure Wound Therapy Foot Right;Posterior 06/24/22  0910  --  40   Negative Pressure Wound Therapy Foot Right;Lateral 07/29/22  1200  --  5   Wound / Incision (Open or Dehisced) 05/27/22 Diabetic ulcer Heel Right 05/27/22  2104  Heel  68            Intake/Output Last 24 hours  Intake/Output Summary (Last 24 hours) at 08/03/2022 1105 Last data filed at 08/03/2022 0052 Gross per 24 hour  Intake 1000 ml  Output --  Net 1000 ml    Labs/Imaging Results for orders placed or performed during the hospital encounter of 08/02/22 (from the past 48 hour(s))  CBG monitoring, ED     Status: Abnormal   Collection Time: 08/02/22 11:01 PM  Result Value Ref Range   Glucose-Capillary 180 (H) 70 - 99 mg/dL  Comment: Glucose reference range applies only to samples taken after fasting for at least 8 hours.  CBC with Differential     Status: Abnormal   Collection Time: 08/02/22 11:38 PM  Result Value Ref Range   WBC 10.7 (H) 4.0 - 10.5 K/uL   RBC 3.46 (L) 4.22 - 5.81 MIL/uL   Hemoglobin 9.4 (L) 13.0 - 17.0 g/dL   HCT 16.1 (L) 09.6 - 04.5 %   MCV 83.5 80.0 - 100.0 fL   MCH 27.2 26.0 - 34.0 pg   MCHC 32.5 30.0 - 36.0 g/dL   RDW 40.9 (H) 81.1 - 91.4 %   Platelets 405 (H) 150 - 400 K/uL   nRBC 0.0 0.0 - 0.2 %   Neutrophils Relative % 79 %   Neutro Abs 8.4 (H) 1.7 - 7.7 K/uL   Lymphocytes Relative 13 %   Lymphs Abs 1.4 0.7 - 4.0 K/uL   Monocytes Relative 7 %   Monocytes Absolute 0.8 0.1  - 1.0 K/uL   Eosinophils Relative 1 %   Eosinophils Absolute 0.1 0.0 - 0.5 K/uL   Basophils Relative 0 %   Basophils Absolute 0.0 0.0 - 0.1 K/uL   Immature Granulocytes 0 %   Abs Immature Granulocytes 0.03 0.00 - 0.07 K/uL    Comment: Performed at [facility], [address], [place], [place] [address]  Comprehensive metabolic panel     Status: Abnormal   Collection Time: 08/02/22 11:38 PM  Result Value Ref Range   Sodium 129 (L) 135 - 145 mmol/L   Potassium 6.3 (HH) 3.5 - 5.1 mmol/L    Comment: HEMOLYSIS AT THIS LEVEL MAY AFFECT RESULT CRITICAL RESULT CALLED TO, READ BACK BY AND VERIFIED WITH K. BRANCH (PARAMEDIC) 08/03/22 @0036  BY J. WHITE    Chloride 92 (L) 98 - 111 mmol/L   CO2 26 22 - 32 mmol/L   Glucose, Bld 178 (H) 70 - 99 mg/dL    Comment: Glucose reference range applies only to samples taken after fasting for at least 8 hours.   BUN 54 (H) 8 - 23 mg/dL   Creatinine, Ser 6.21 (H) 0.61 - 1.24 mg/dL   Calcium 9.0 8.9 - 30.8 mg/dL   Total Protein 8.1 6.5 - 8.1 g/dL   Albumin 2.8 (L) 3.5 - 5.0 g/dL   AST 29 15 - 41 U/L    Comment: HEMOLYSIS AT THIS LEVEL MAY AFFECT RESULT   ALT 19 0 - 44 U/L    Comment: HEMOLYSIS AT THIS LEVEL MAY AFFECT RESULT   Alkaline Phosphatase 111 38 - 126 U/L   Total Bilirubin 1.0 0.3 - 1.2 mg/dL    Comment: HEMOLYSIS AT THIS LEVEL MAY AFFECT RESULT   GFR, Estimated 43 (L) >60 mL/min    Comment: (NOTE) Calculated using the CKD-EPI Creatinine Equation (2021)    Anion gap 11 5 - 15    Comment: Performed at [facility], [address] [place], [place] [address]  Lactic acid, plasma     Status: None   Collection Time: 08/02/22 11:38 PM  Result Value Ref Range   Lactic Acid, Venous 1.1 0.5 - 1.9 mmol/L    Comment: Performed at [facility], [address], [place], [place] [address]  Troponin I (High Sensitivity)     Status: Abnormal   Collection Time: 08/02/22 11:38 PM  Result Value Ref Range   Troponin I (High Sensitivity) 37  (H) <18 ng/L    Comment: (NOTE) Elevated high sensitivity  troponin I (hsTnI) values and significant  changes across serial measurements may suggest ACS but many other  chronic and acute conditions are known to elevate hsTnI results.  Refer to the "Links" section for chest pain algorithms and additional  guidance. Performed at [facility], [address], [place], [place] [address]   Lipase, blood     Status: None   Collection Time: 08/02/22 11:38 PM  Result Value Ref Range   Lipase 31 11 - 51 U/L    Comment: Performed at [facility] Lab, [address], [place], [place] [address]  Beta-hydroxybutyric acid     Status: Abnormal   Collection Time: 08/02/22 11:38 PM  Result Value Ref Range   Beta-Hydroxybutyric Acid 0.53 (H) 0.05 - 0.27 mmol/L    Comment: Performed at [facility], [address] [place], [place] [address]  I-Stat venous blood gas, [facility] ED, [facility], [facility])     Status: Abnormal   Collection Time: 08/03/22 12:33 AM  Result Value Ref Range   pH, Ven 7.403 7.25 - 7.43   pCO2, Ven 46.9 44 - 60 mmHg   pO2, Ven 97 (H) 32 - 45 mmHg   Bicarbonate 29.2 (H) 20.0 - 28.0 mmol/L   TCO2 31 22 - 32 mmol/L   O2 Saturation 97 %   Acid-Base Excess 4.0 (H) 0.0 - 2.0 mmol/L   Sodium 129 (L) 135 - 145 mmol/L   Potassium 6.3 (HH) 3.5 - 5.1 mmol/L   Calcium, Ion 1.10 (L) 1.15 - 1.40 mmol/L   HCT 30.0 (L) 39.0 - 52.0 %   Hemoglobin 10.2 (L) 13.0 - 17.0 g/dL   Sample type VENOUS    Comment NOTIFIED PHYSICIAN   Lactic acid, plasma     Status: None   Collection Time: 08/03/22 12:57 AM  Result Value Ref Range   Lactic Acid, Venous 1.1 0.5 - 1.9 mmol/L    Comment: Performed at [facility] Lab, [address] [place], [place] [address]  Potassium     Status: None   Collection Time: 08/03/22 12:57 AM  Result Value Ref Range   Potassium 4.8 3.5 - 5.1 mmol/L    Comment: Performed at [facility], [address], [place], [place] [address]  Troponin I (High Sensitivity)      Status: Abnormal   Collection Time: 08/03/22 12:57 AM  Result Value Ref Range   Troponin I (High Sensitivity) 36 (H) <18 ng/L    Comment: (NOTE) Elevated high sensitivity troponin I (hsTnI) values and significant  changes across serial measurements may suggest ACS but many other  chronic and acute conditions are known to elevate hsTnI results.  Refer to the "Links" section for chest pain algorithms and additional  guidance. Performed at [facility], [address] [place], [place] [address]   Urinalysis, Routine w reflex microscopic -Urine, Clean Catch     Status: Abnormal   Collection Time: 08/03/22  1:56 AM  Result Value Ref Range   Color, Urine STRAW (A) YELLOW   APPearance CLEAR CLEAR   Specific Gravity, Urine 1.011 1.005 - 1.030   pH 5.0 5.0 - 8.0   Glucose, UA >=500 (A) NEGATIVE mg/dL   Hgb urine dipstick NEGATIVE NEGATIVE   Bilirubin Urine NEGATIVE NEGATIVE   Ketones, ur NEGATIVE NEGATIVE mg/dL   Protein, ur 30 (A) NEGATIVE mg/dL   Nitrite NEGATIVE NEGATIVE   Leukocytes,Ua NEGATIVE NEGATIVE   RBC / HPF 0-5 0 - 5 RBC/hpf  WBC, UA 0-5 0 - 5 WBC/hpf   Bacteria, UA NONE SEEN NONE SEEN   Squamous Epithelial / HPF 0-5 0 - 5 /HPF   Mucus PRESENT     Comment: Performed at [facility], [address], [place], [place] [address]   CT ABDOMEN PELVIS W CONTRAST  Result Date: 08/03/2022 CLINICAL DATA:  Abdominal pain. EXAM: CT ABDOMEN AND PELVIS WITH CONTRAST TECHNIQUE: Multidetector CT imaging of the abdomen and pelvis was performed using the standard protocol following bolus administration of intravenous contrast. RADIATION DOSE REDUCTION: This exam was performed according to the departmental dose-optimization program which includes automated exposure control, adjustment of the mA and/or kV according to patient size and/or use of iterative reconstruction technique. CONTRAST:  75mL OMNIPAQUE IOHEXOL 350 MG/ML SOLN COMPARISON:  May 27, 2020 FINDINGS: Lower chest: An  artificial aortic valve is seen. Hepatobiliary: No focal liver abnormality is seen. No gallstones, gallbladder wall thickening, or biliary dilatation. Pancreas: Unremarkable. No pancreatic ductal dilatation or surrounding inflammatory changes. Spleen: Normal in size without focal abnormality. Adrenals/Urinary Tract: Adrenal glands are unremarkable. Kidneys are normal, without renal calculi, focal lesion, or hydronephrosis. Bladder is unremarkable. Stomach/Bowel: The stomach is poorly distended. Mild thickening of the gastric antrum is noted. The appendix appears normal. Stool is seen throughout the large bowel. No evidence of bowel wall thickening, distention, or inflammatory changes. Vascular/Lymphatic: Aortic atherosclerosis. No enlarged abdominal or pelvic lymph nodes. Reproductive: The prostate gland is mildly enlarged. Other: No abdominal wall hernia or abnormality. No abdominopelvic ascites. Musculoskeletal: Stable, increased trabeculation is seen within the pelvis, sacrum and coccyx. No acute osseous abnormalities are identified. IMPRESSION: 1. Mild thickening of the gastric antrum which may be, in part, related to poor gastric distension. Mild gastritis cannot be excluded. 2. Stable, increased trabeculation within the pelvis, sacrum and coccyx, likely representing Paget's disease. 3. Aortic atherosclerosis. 4. Prostatomegaly. Aortic Atherosclerosis (ICD10-I70.0). Electronically Signed   By: Aram Candela M.D.   On: 08/03/2022 03:14   CT Head Wo Contrast  Result Date: 08/03/2022 CLINICAL DATA:  Altered mental status. EXAM: CT HEAD WITHOUT CONTRAST TECHNIQUE: Contiguous axial images were obtained from the base of the skull through the vertex without intravenous contrast. RADIATION DOSE REDUCTION: This exam was performed according to the departmental dose-optimization program which includes automated exposure control, adjustment of the mA and/or kV according to patient size and/or use of iterative  reconstruction technique. COMPARISON:  November 13, 2019 FINDINGS: Brain: There is mild cerebral atrophy with widening of the extra-axial spaces and ventricular dilatation. There are areas of decreased attenuation within the white matter tracts of the supratentorial brain, consistent with microvascular disease changes. Vascular: No hyperdense vessel or unexpected calcification. Skull: Normal. Negative for fracture or focal lesion. Sinuses/Orbits: No acute finding. Other: None. IMPRESSION: 1. No acute intracranial abnormality. 2. Generalized cerebral atrophy and microvascular disease changes of the supratentorial brain. Electronically Signed   By: Aram Candela M.D.   On: 08/03/2022 03:09   DG ABD ACUTE 2+V W 1V CHEST  Result Date: 08/03/2022 CLINICAL DATA:  Vomiting EXAM: DG ABDOMEN ACUTE WITH 1 VIEW CHEST COMPARISON:  Chest x-ray 05/27/2022 FINDINGS: The heart is enlarged, patient is status post TAVR. The lungs and costophrenic angles are clear. There is no pneumothorax. Bowel-gas pattern is nonobstructive. There is average stool burden. Multiple wires overlie the abdomen. There are no suspicious calcifications. No acute fractures are seen. IMPRESSION: 1. Cardiomegaly without edema or consolidation. 2. Nonobstructive bowel gas pattern. Average stool burden. Electronically Signed  By: Darliss Cheney M.D.   On: 08/03/2022 00:01    Pending Labs Unresulted Labs (From admission, onward)     Start     Ordered   08/04/22 0500  Basic metabolic panel  Tomorrow morning,   R        08/03/22 0624   08/04/22 0500  CBC  Tomorrow morning,   R        08/03/22 0624   08/03/22 0850  Basic metabolic panel  Once,   R        08/03/22 0851   08/03/22 0850  CBC  Once,   R        08/03/22 0851            Vitals/Pain Today's Vitals   08/03/22 0936 08/03/22 0938 08/03/22 1011 08/03/22 1012  BP: 105/71     Pulse: 88     Resp: (!) 21     Temp:      TempSrc:      SpO2: 99%     Weight:      Height:       PainSc:  10-Worst pain ever 2  2     Isolation Precautions No active isolations  Medications Medications  atorvastatin (LIPITOR) tablet 40 mg (40 mg Oral Given 08/03/22 1029)  carvedilol (COREG) tablet 12.5 mg (12.5 mg Oral Given 08/03/22 0757)  furosemide (LASIX) tablet 40 mg (has no administration in time range)  hydrALAZINE (APRESOLINE) tablet 10 mg (0 mg Oral Hold 08/03/22 0949)  isosorbide mononitrate (IMDUR) 24 hr tablet 30 mg (0 mg Oral Hold 08/03/22 0949)  sacubitril-valsartan (ENTRESTO) 49-51 mg per tablet (0 tablets Oral Hold 08/03/22 0949)  morphine (PF) 2 MG/ML injection 2 mg (2 mg Intravenous Given 08/03/22 0942)  enoxaparin (LOVENOX) injection 40 mg (has no administration in time range)  acetaminophen (TYLENOL) tablet 650 mg (has no administration in time range)    Or  acetaminophen (TYLENOL) suppository 650 mg (has no administration in time range)  traZODone (DESYREL) tablet 25 mg (has no administration in time range)  ondansetron (ZOFRAN) tablet 4 mg (has no administration in time range)    Or  ondansetron (ZOFRAN) injection 4 mg (has no administration in time range)  sodium chloride flush (NS) 0.9 % injection 3 mL (0 mLs Intravenous Hold 08/03/22 0950)  albuterol (PROVENTIL) (2.5 MG/3ML) 0.083% nebulizer solution 2.5 mg (has no administration in time range)  0.9 %  sodium chloride infusion ( Intravenous New Bag/Given 08/03/22 0942)  pantoprazole (PROTONIX) injection 40 mg (40 mg Intravenous Given 08/03/22 1029)  insulin aspart (novoLOG) injection 0-15 Units (has no administration in time range)  insulin aspart (novoLOG) injection 0-5 Units (has no administration in time range)  lactated ringers bolus 1,000 mL (0 mLs Intravenous Stopped 08/03/22 0052)  sodium zirconium cyclosilicate (LOKELMA) packet 10 g (10 g Oral Given 08/03/22 0057)  calcium gluconate inj 10% (1 g) URGENT USE ONLY! (1 g Intravenous Given 08/03/22 0052)  iohexol (OMNIPAQUE) 350 MG/ML injection 75 mL (75 mLs  Intravenous Contrast Given 08/03/22 0300)    Mobility non-ambulatory     Focused Assessments     R Recommendations: See Admitting Provider Note  Report given to:   Additional Notes:

## 2022-08-03 NOTE — Telephone Encounter (Signed)
   Telephone encounter was:  Successful.  08/03/2022 Name: Caleb [name] MRN: [id number] DOB: 1950/04/15  Caleb [name] is a 72 y.o. year old male who is a primary care patient of Hoy Register, MD . The community resource team was consulted for assistance with Food Insecurity  Care guide performed the following interventions: Patient was not available to talk request that I call later today.  Follow Up Plan:   I will call patient later today as requested.  Caleb [name]  [facility] Population Health Community Resource Care Guide   [email or web address]  ?? [phone]   Website: [email or web address]  [email or web address]

## 2022-08-03 NOTE — Telephone Encounter (Signed)
This pt is s/p a right partial calcaneal excision and vac placement with new sponge 07/29/2022. He d/c from the hospital yesterday and was readmitted today for n/v. Admitting to telemetry bed pt wanted to to be aware. Initially called 911 for transport because wound vac stopped working

## 2022-08-03 NOTE — H&P (Addendum)
She is History and Physical    Patient: Caleb [name] ZOX:[id number] DOB: 09/05/1950 DOA: 08/02/2022 DOS: the patient was seen and examined on 08/03/2022 PCP: Hoy Register, MD  Patient coming from: Home  Chief Complaint:  Chief Complaint  Patient presents with   Nausea    Pt arrived via ems from home after being discharged from hospital after sx c/o nausea and weakness   HPI: Caleb [name] is a 72 y.o. male with medical history significant of hypertension, hyperlipidemia, HFrEF last EF 35%, severe aortic stenosis s/p TAVR, diabetes mellitus type 2, peripheral vascular disease s/p bypass, CKD stage IIIb, MGUS, and osteomyelitis of the right heel s/p right partial calcaneus excision 4/5 who presents with complaints of nausea and vomiting.  He just recently had been hospitalized from 5/10-5/14 for dehiscence requiring being taken back to the operating room by Dr. Lajoyce Corners on 5/10.  Patient notes that about an hour after getting home he had an episode of nausea and vomiting.  He vomited up his sandwich that he did eat earlier.  Denies seeing any blood in the emesis.  He tried to take a nap and when he woke up around 7 PM he reported that his wound VAC just recently been placed was beeping and plugs were where they were supposed to be, but he was not educated on how to work the machine.  He reported feeling disoriented and clammy.  He called EMS as he was concerned something was wrong.  In the emergency department patient was noted to be afebrile with pulse 84-108, respirations 12-25, and all other vital signs relatively maintained.  Labs since 5/14 significant for WBC 10.7, hemoglobin 9.4, platelets 405, sodium 129, potassium 6.3->4.8, BUN 54, creatinine 1.69, glucose 178, lipase 31, and lactic acid 1.1.  Acute abdominal series noted cardiomegaly without edema or consolidation and nonobstructive bowel pattern with after stool burden.  Urinalysis noted glucose, negative ketones, and no findings to give  concern for infection.  CT scan of the head did not note any acute intercranial abnormality.  CT scan of the abdomen pelvis noted mild thickening of the gastric antrum which may be in part related to poor gastric distention with possibility of mild gastroenteritis, stable findings concerning for Paget's disease.  Patient has been given 1 L of lactated Ringer's, antiemetics, and Lokelma 10 g p.o. Review of Systems: As mentioned in the history of present illness. All other systems reviewed and are negative. 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 Excell Seltzer & Bartle   Severe aortic stenosis    Vitamin D deficiency 06/23/2015   Past Surgical History:  Procedure Laterality Date   ABDOMINAL AORTOGRAM W/LOWER EXTREMITY N/A 08/08/2019   Procedure: ABDOMINAL AORTOGRAM W/LOWER EXTREMITY;  Surgeon: Runell Gess, MD;  Location: [facility] INVASIVE CV LAB;  Service: Cardiovascular;  Laterality: N/A;   ABDOMINAL AORTOGRAM W/LOWER  EXTREMITY N/A 11/18/2019   Procedure: ABDOMINAL AORTOGRAM W/LOWER EXTREMITY;  Surgeon: Runell Gess, MD;  Location: [facility] INVASIVE CV LAB;  Service: Cardiovascular;  Laterality: N/A;   ABDOMINAL AORTOGRAM W/LOWER EXTREMITY N/A 06/13/2022   Procedure: ABDOMINAL AORTOGRAM W/LOWER EXTREMITY;  Surgeon: Chuck Hint, MD;  Location: [facility] INVASIVE CV LAB;  Service: Cardiovascular;   Laterality: N/A;   APPLICATION OF WOUND VAC  06/24/2022   Procedure: APPLICATION OF WOUND VAC;  Surgeon: Nadara Mustard, MD;  Location: [facility] OR;  Service: Orthopedics;;   CARDIAC CATHETERIZATION     COLONOSCOPY  12 years ago   in [place] clinic= normal exam per pt   ENDARTERECTOMY FEMORAL Right 06/17/2022   Procedure: SUPERFICIAL ENDARTERECTOMY OF COMMON FEMORAL ARTERY;  Surgeon: Victorino Sparrow, MD;  Location: [facility] OR;  Service: Vascular;  Laterality: Right;   FEMORAL-TIBIAL BYPASS GRAFT Right 06/17/2022   Procedure: RIGHT FEMORAL- ANTERIOR TIBIAL ARTERY BYPASS;  Surgeon: Victorino Sparrow, MD;  Location: [facility] OR;  Service: Vascular;  Laterality: Right;   I & D EXTREMITY Right 06/24/2022   Procedure: RIGHT PARTIAL CALCANEUS EXCISION;  Surgeon: Nadara Mustard, MD;  Location: [facility] OR;  Service: Orthopedics;  Laterality: Right;   I & D EXTREMITY Right 07/29/2022   Procedure: RIGHT PARTIAL CALCANEUS EXCISION;  Surgeon: Nadara Mustard, MD;  Location: [facility] OR;  Service: Orthopedics;  Laterality: Right;   INTRAOPERATIVE TRANSTHORACIC ECHOCARDIOGRAM N/A 01/11/2022   Procedure: INTRAOPERATIVE TRANSTHORACIC ECHOCARDIOGRAM;  Surgeon: Tonny Bollman, MD;  Location: [facility] INVASIVE CV LAB;  Service: Open Heart Surgery;  Laterality: N/A;   KNEE SURGERY     MULTIPLE EXTRACTIONS WITH ALVEOLOPLASTY N/A 12/09/2021   Procedure: MULTIPLE EXTRACTION;  Surgeon: Sharman Cheek, DMD;  Location: [facility] OR;  Service: Dentistry;  Laterality: N/A;   PERIPHERAL VASCULAR INTERVENTION Right 08/08/2019   Procedure: PERIPHERAL VASCULAR INTERVENTION;  Surgeon: Runell Gess, MD;  Location: [facility] INVAS[facility] CV [facility];  Service: Cardiovascular;  Laterality: Right;   PERIPHERAL VASCULAR INTERVENTION Left 11/18/2019   Procedure: PERIPHERAL VASCULAR INTERVENTION;  Surgeon: Runell Gess, MD;  Location: [facility];  Service: Cardiovascular;  Laterality: Left;  left popliteal artery   RIGHT/LEFT HEART CATH AND CORONARY ANGIOGRAPHY N/A 10/29/2021    Procedure: RIGHT/LEFT HEART CATH AND CORONARY ANGIOGRAPHY;  Surgeon: Kathleene Hazel, MD;  Location: [facility] INVAS[facility] CV LAB;  Service: Cardiovascular;  Laterality: N/A;   TRANSCAROTID ARTERY REVASCULARIZATION  Left 12/16/2019   Procedure: TRANSCAROTID ARTERY REVASCULARIZATION;  Surgeon: Sherren Kerns, MD;  Location[facility] OR;  Service: Vascular;  Laterality: Left;   TRANSCAROTID ARTERY REVASCULARIZATION  Right 02/03/2020   Procedure: RIGHT TRANSCAROTID ARTERY REVASCULARIZATION;  Surgeon: Sherren Kerns, MD;  Location: [facility] OR;  Service: Vascular;  Laterality: Right;   TRANSCATHETER AORTIC VALVE REPLACEMENT, TRANSFEMORAL N/A 01/11/2022   Procedure: Transcatheter Aortic Valve Replacement, Transfemoral;  Surgeon: Tonny Bollman, MD;  Location: [facility] INVASIVE CV LAB;  Service: Open Heart Surgery;  Laterality: N/A;   ULTRASOUND GUIDANCE FOR VASCULAR ACCESS Right 12/16/2019   Procedure: ULTRASOUND GUIDANCE FOR VASCULAR ACCESS;  Surgeon: Sherren Kerns, MD;  Location: [facility] OR;  Service: Vascular;  Laterality: Right;   ULTRASOUND GUIDANCE FOR VASCULAR ACCESS Right 02/03/2020   Procedure: ULTRASOUND GUIDANCE FOR VASCULAR ACCESS;  Surgeon: Sherren Kerns, MD;  Location: [facility] OR;  Service: Vascular;  Laterality: Right;   VASECTOMY     VEIN HARVEST Right 06/17/2022   Procedure: IPSILATERAL RIGHT GREATER SAPHENOUS VEIN HARVEST;  Surgeon: Victorino Sparrow, MD;  Location: [facility]  OR;  Service: Vascular;  Laterality: Right;   Social History:  reports that he has never smoked. He has never been exposed to tobacco smoke. He has never used smokeless tobacco. He reports current alcohol use. He reports that he does not use drugs.  Allergies  Allergen Reactions   Codeine Nausea And Vomiting   Percocet [Oxycodone-Acetaminophen] Nausea And Vomiting   Tramadol Hcl Nausea And Vomiting    Family History  Problem Relation Age of Onset   CAD Father    Hypertension Father    Alcohol abuse Father        Cause of  death   Diabetes Mother    Colon polyps Mother    CAD Brother 17       CABG   Colon cancer Neg Hx    Esophageal cancer Neg Hx    Rectal cancer Neg Hx    Stomach cancer Neg Hx     Prior to Admission medications   Medication Sig Start Date End Date Taking? Authorizing Provider  ARTIFICIAL TEARS PF 0.1-0.3 % SOLN Place 1 drop into both eyes every 8 (eight) hours as needed (for dryness).   Yes [provider]  aspirin EC 81 MG tablet Take 81 mg by mouth in the morning.   Yes [provider]  atorvastatin (LIPITOR) 40 MG tablet Take 40 mg by mouth in the morning. 07/03/20  Yes [provider]  carvedilol (COREG) 12.5 MG tablet Take 1 tablet (12.5 mg total) by mouth 2 (two) times daily with a meal. 07/25/22 08/24/22 Yes Newlin, Enobong, MD  Cholecalciferol (VITAMIN D-3) 25 MCG (1000 UT) CAPS Take 1,000 Units by mouth daily.   Yes [provider]  clopidogrel (PLAVIX) 75 MG tablet Take 75 mg by mouth daily.   Yes [provider]  ferrous sulfate 325 (65 FE) MG tablet Take 325 mg by mouth daily with breakfast.   Yes [provider]  furosemide (LASIX) 40 MG tablet Take 1 tablet (40 mg total) by mouth daily at 4 PM. And 1 tablet (80 mg) daily in the morning 07/25/22  Yes Newlin, Enobong, MD  furosemide (LASIX) 80 MG tablet Take 1 tablet (80 mg total) by mouth in the morning. And 1 tablet (40 mg) in the evening 07/25/22  Yes Newlin, Enobong, MD  gabapentin (NEURONTIN) 800 MG tablet Take 800 mg by mouth 3 (three) times daily. 07/03/20  Yes [provider]  hydrALAZINE (APRESOLINE) 10 MG tablet Take 1 tablet (10 mg total) by mouth 3 (three) times daily. 07/25/22 08/24/22 Yes Hoy Register, MD  HYDROcodone-acetaminophen (NORCO/VICODIN) 5-325 MG tablet Take 1 tablet by mouth every 4 (four) hours as needed. 08/02/22  Yes Nadara Mustard, MD  insulin NPH-regular Human (70-30) 100 UNIT/ML injection Inject 7-8 Units into the skin See admin instructions. Inject  8 units into the skin before breakfast and 7 units before the evening meal   Yes [provider]  isosorbide mononitrate (IMDUR) 30 MG 24 hr tablet Take 1 tablet (30 mg total) by mouth daily. 01/13/22 01/13/23 Yes Janetta Hora, PA-C  NON FORMULARY Take 16 oz by mouth See admin instructions. [employer] Signature Organic Raw Kombucha, Ginger Lemonade- Drink 16 ounces by mouth once a day   Yes [provider]  ondansetron (ZOFRAN) 4 MG tablet Take 1 tablet (4 mg total) by mouth every 8 (eight) hours as needed for nausea or vomiting. 07/25/22  Yes Hoy Register, MD  polyethylene glycol (MIRALAX / GLYCOLAX) 17 g packet Take  17 g by mouth daily. Patient taking differently: Take 17 g by mouth daily as needed for mild constipation. 06/02/22  Yes Osvaldo Shipper, MD  Povidone-Iodine (BETADINE ANTISEPTIC EX) Apply 1 application  topically See admin instructions. Use as directed every other day for wound care (right heel site)   Yes [provider]  PRESCRIPTION MEDICATION Inject 0.1-1 mLs as directed See admin instructions. Tri-Mix Standard Strength 5 ml. Formula: Prostaglandin 10 mcg/ml, Papaverine 30 mg/ml, Phentolamine 1 mg/ml. Inject 0.1 ml into side of penis as directed as needed (to be injected immediately before sexual intercourse) may increase the dose by 0.1 ml every 48 hours to achieve an erection. Max dose 1 ml.   Yes [provider]  sacubitril-valsartan (ENTRESTO) 49-51 MG Take 1 tablet by mouth 2 (two) times daily. 03/07/22  Yes Sabharwal, Aditya, DO  vitamin B-12 (CYANOCOBALAMIN) 500 MCG tablet Take 500 mcg by mouth in the morning. 08/24/21  Yes [provider]  apixaban (ELIQUIS) 5 MG TABS tablet Take 1 tablet (5 mg total) by mouth 2 (two) times daily. Patient not taking: Reported on 07/25/2022 07/25/22   Hoy Register, MD  Continuous Blood Gluc Sensor (FREESTYLE LIBRE 14 DAY SENSOR) MISC Inject 1 Device into the skin every 14 (fourteen) days.     [provider]    Physical Exam: Vitals:   08/03/22 0445 08/03/22 0500 08/03/22 0515 08/03/22 0530  BP: (!) 122/54 107/67 99/60 109/69  Pulse: 90 89 94 93  Resp:      Temp:      TempSrc:      SpO2: 100% 98% (!) 79% 100%  Weight:      Height:       Constitutional: Elderly male who appears to be in no acute distress at this time Eyes: PERRL, lids and conjunctivae normal ENMT: Mucous membranes are moist.  Normal dentition.  Neck: normal, supple  Respiratory: clear to auscultation bilaterally, no wheezing, no crackles. Normal respiratory effort.  Cardiovascular: Regular rate and rhythm, no murmurs / rubs / gallops.   2+ pedal pulses.   Abdomen: no tenderness, no masses palpated. Bowel sounds positive.  Musculoskeletal: no clubbing / cyanosis.  Deformity of the right heel with wound VAC in place and clotted blood present and machine currently beeping. Skin: no rashes, lesions, ulcers. No induration Neurologic: CN 2-12 grossly intact.  Able to move all extremities. Psychiatric: Normal judgment and insight. Normal mood.   Data Reviewed:  EKG reveals sinus tachycardia at 101 bpm with probable LVH and QTc 461.  Reviewed labs, imaging, and pertinent records as noted above in HPI.  Assessment and Plan:  Nausea and vomiting  Gastritis Acute.  Patient presented after having an episode of nausea and vomiting.  Lipase within normal limits at 31.  CT scan of abdomen pelvis noted mild thickening of the gastric antrum concerning for gastritis.  Denies any subsequent episodes of vomiting since being here at the hospital.. -Admit to a medical telemetry bed -Aspiration precautions with elevation head of bed -Diet as tolerated -Protonix 40 mg IV twice daily -Antiemetics as needed  Anemia Acute on chronic.  Hemoglobin 9.4 ->7.8->8.5. This appears to be almost a 2 g drop from prior.  Denies any blood in emesis.  Patient has dried blood around the heel of his right foot.  Suspect the  hemoglobin of 7.8 likely dilutional as all 3 cell lines had dropped. -Type and screen for possible need of blood products -Check stool guaiac -Serial monitoring of H&H -Transient blood  rocs as needed to maintain goal hemoglobin greater than 8 g/dL  Leukocytosis Resolved.  WBC elevated at 10.7-> 9.4.  Patient otherwise noted to be afebrile with vital signs otherwise stable.  Thought possibly reactive to above. -Continue to monitor  Hyperkalemia Resolved.  Initial potassium elevated at 6.3 which was rechecked.  After initial therapies and IV fluids repeat check 4.8->4.6.  Diabetic heel ulcer History of osteomyelitis of the right calcaneus Encounter for management of wound VAC Patient previously had been diagnosed with osteomyelitis of the right heel by MRI on 06/07/2022.  Patient underwent partial resection of the calcaneus on 4/5.  He was noted to have wound dehiscence and underwent subsequent procedure with Dr. Lajoyce Corners 5/10.  Wound VAC noted to be malfunctioning. -Dr. Lajoyce Corners of orthopedics consulted for evaluation and malfunction of wound VAC  Heart failure with reduced EF Chronic.  Patient appears clinically euvolemic at this time.  Last echocardiogram noted EF to be 25-30% with grade 2 diastolic dysfunction. -Strict I&O's and daily weights CBCs -Continue home medication regimen  Diabetes mellitus type 2, with long-term use of insulin Last available hemoglobin A1c was 7.8 on 07/29/2022.  Home medication regimen includes 70/30 insulin. -Hypoglycemic protocols -CBGs before every meal with moderate SSI -Adjust insulin regimen as needed  Essential hypertension Blood pressures currently maintained. -Continue home blood pressure regimen as tolerated  CKD stage IIIb On admission creatinine elevated at 1.69 with BUN 54.  Repeat hemoglobin check Cr -Recheck kidney function  Peripheral vascular disease Patient previously had undergone right femoral-anterior tibial artery bypass with  superficial endarterectomy of the common femoral artery back in 05/2022 by Dr. Sherral Hammers.  Eliquis had been started following procedure. -Continue ASA and plavix due to recently placed graft -Hold Eliquis.  Last dose given 5/14.  Paget's disease Chronic.  Noted on imaging.  DVT prophylaxis: None, due to concern for possible bleeding Advance Care Planning:   Code Status: Full Code   Consults: Orthopedics  Family Communication: none requested  Severity of Illness: The appropriate patient status for this patient is OBSERVATION. Observation status is judged to be reasonable and necessary in order to provide the required intensity of service to ensure the patient's safety. The patient's presenting symptoms, physical exam findings, and initial radiographic and laboratory data in the context of their medical condition is felt to place them at decreased risk for further clinical deterioration. Furthermore, it is anticipated that the patient will be medically stable for discharge from the hospital within 2 midnights of admission.   Author: Clydie Braun, MD 08/03/2022 7:32 AM  For on call review [email or web address].

## 2022-08-04 ENCOUNTER — Other Ambulatory Visit (HOSPITAL_COMMUNITY): Payer: Self-pay

## 2022-08-04 DIAGNOSIS — Z8739 Personal history of other diseases of the musculoskeletal system and connective tissue: Secondary | ICD-10-CM | POA: Diagnosis not present

## 2022-08-04 DIAGNOSIS — R112 Nausea with vomiting, unspecified: Secondary | ICD-10-CM | POA: Diagnosis not present

## 2022-08-04 DIAGNOSIS — T85698A Other mechanical complication of other specified internal prosthetic devices, implants and grafts, initial encounter: Secondary | ICD-10-CM | POA: Diagnosis not present

## 2022-08-04 DIAGNOSIS — R2689 Other abnormalities of gait and mobility: Secondary | ICD-10-CM | POA: Diagnosis not present

## 2022-08-04 DIAGNOSIS — D649 Anemia, unspecified: Secondary | ICD-10-CM | POA: Diagnosis not present

## 2022-08-04 DIAGNOSIS — M6281 Muscle weakness (generalized): Secondary | ICD-10-CM | POA: Diagnosis not present

## 2022-08-04 DIAGNOSIS — Z7901 Long term (current) use of anticoagulants: Secondary | ICD-10-CM | POA: Diagnosis not present

## 2022-08-04 DIAGNOSIS — Z952 Presence of prosthetic heart valve: Secondary | ICD-10-CM | POA: Diagnosis not present

## 2022-08-04 DIAGNOSIS — E1122 Type 2 diabetes mellitus with diabetic chronic kidney disease: Secondary | ICD-10-CM | POA: Diagnosis not present

## 2022-08-04 DIAGNOSIS — Z4689 Encounter for fitting and adjustment of other specified devices: Secondary | ICD-10-CM | POA: Diagnosis not present

## 2022-08-04 DIAGNOSIS — Z7982 Long term (current) use of aspirin: Secondary | ICD-10-CM | POA: Diagnosis not present

## 2022-08-04 DIAGNOSIS — I739 Peripheral vascular disease, unspecified: Secondary | ICD-10-CM | POA: Diagnosis not present

## 2022-08-04 DIAGNOSIS — N1831 Chronic kidney disease, stage 3a: Secondary | ICD-10-CM | POA: Diagnosis not present

## 2022-08-04 DIAGNOSIS — R2681 Unsteadiness on feet: Secondary | ICD-10-CM | POA: Diagnosis not present

## 2022-08-04 DIAGNOSIS — I130 Hypertensive heart and chronic kidney disease with heart failure and stage 1 through stage 4 chronic kidney disease, or unspecified chronic kidney disease: Secondary | ICD-10-CM | POA: Diagnosis not present

## 2022-08-04 LAB — GLUCOSE, CAPILLARY
Glucose-Capillary: 142 mg/dL — ABNORMAL HIGH (ref 70–99)
Glucose-Capillary: 198 mg/dL — ABNORMAL HIGH (ref 70–99)
Glucose-Capillary: 130 mg/dL — ABNORMAL HIGH (ref 70–99)

## 2022-08-04 MED ORDER — OXYCODONE HCL 5 MG PO TABS
5.0000 mg | ORAL_TABLET | Freq: Four times a day (QID) | ORAL | Status: DC | PRN
  Administered 2022-08-04: 5 mg via ORAL
  Filled 2022-08-04: qty 1

## 2022-08-04 MED ORDER — APIXABAN 5 MG PO TABS
5.0000 mg | ORAL_TABLET | Freq: Two times a day (BID) | ORAL | 1 refills | Status: AC
  Filled 2022-08-04: qty 60, 30d supply, fill #0

## 2022-08-04 NOTE — Evaluation (Addendum)
Physical Therapy Evaluation Patient Details Name: Caleb [name] MRN: [id number] DOB: Jan 12, 1951 Today's Date: 08/04/2022  History of Present Illness  Pt is 72 yo male who presents 08/03/2022 reporting his wound vac was not working, experiencing nausea/vomiting, and AMS. Recent admission with R calcaneal I&D on 07/29/22. PMH: CHF, HTN, HLD, DM2, GERD, PVD, pancreatitis, s/p TAVR  Clinical Impression   Pt presents with RLE pain, RLE weakness, impaired balance, impaired gait, and decreased activity tolerance. Pt to benefit from acute PT to address deficits. Pt ambulated short room distance with use of RW and hop-to gait, pt fatigues quickly and is limited by RLE pain. Pt adamant about d/c home with HHPT and support of cousin as needed. Pt states "I am going to go home and rest...not do very much". PT encouraged frequent OOB mobility, at least 5x/day progressing to 1x/hour during waking hours to maintain strength, mobility and prevent secondary complications (blood clots, pressure sores, etc). Pt expresses understanding. PT to progress mobility as tolerated, and will continue to follow acutely.         Recommendations for follow up therapy are one component of a multi-disciplinary discharge planning process, led by the attending physician.  Recommendations may be updated based on patient status, additional functional criteria and insurance authorization.  Follow Up Recommendations       Assistance Recommended at Discharge Frequent or constant Supervision/Assistance  Patient can return home with the following  A little help with walking and/or transfers;A little help with bathing/dressing/bathroom;Assistance with cooking/housework;Assist for transportation;Help with stairs or ramp for entrance    Equipment Recommendations None recommended by PT  Recommendations for Other Services       Functional Status Assessment Patient has had a recent decline in their functional status and demonstrates the  ability to make significant improvements in function in a reasonable and predictable amount of time.     Precautions / Restrictions Precautions Precautions: Fall Required Braces or Orthoses: Other Brace Other Brace: PRAFO R foot Restrictions Weight Bearing Restrictions: Yes RLE Weight Bearing: Non weight bearing Other Position/Activity Restrictions: Per PA note 5/16 may be minimal touchdown weightbearing on R foot, Dr. Lajoyce Corners states "minimize WB"      Mobility  Bed Mobility Overal bed mobility: Needs Assistance Bed Mobility: Supine to Sit, Sit to Supine     Supine to sit: Supervision, HOB elevated Sit to supine: Supervision, HOB elevated   General bed mobility comments: use of bedrails and increased time    Transfers Overall transfer level: Needs assistance Equipment used: Rolling walker (2 wheels) Transfers: Sit to/from Stand Sit to Stand: Min guard           General transfer comment: close guard for safety, pt able to select proper hand placement to rise when given increased time    Ambulation/Gait Ambulation/Gait assistance: Supervision Gait Distance (Feet): 10 Feet Assistive device: Rolling walker (2 wheels) Gait Pattern/deviations: Step-through pattern Gait velocity: decr     General Gait Details: hop-to pattern, pt limited in distance by fatigue and severe RLE pain  Stairs            Wheelchair Mobility    Modified Rankin (Stroke Patients Only)       Balance Overall balance assessment: Needs assistance Sitting-balance support: No upper extremity supported, Feet supported Sitting balance-Leahy Scale: Fair     Standing balance support: Bilateral upper extremity supported, During functional activity, Reliant on assistive device for balance Standing balance-Leahy Scale: Poor  Pertinent Vitals/Pain Pain Assessment Pain Assessment: Faces Faces Pain Scale: Hurts even more Pain Location: R foot Pain  Descriptors / Indicators: Sore, Guarding, Grimacing Pain Intervention(s): Monitored during session, Limited activity within patient's tolerance, Repositioned    Home Living Family/patient expects to be discharged to:: Private residence Living Arrangements: Alone Available Help at Discharge: Family;Available PRN/intermittently Type of Home: House Home Access: Level entry       Home Layout: One level Home Equipment: Crutches;Cane - single point;Wheelchair - Forensic psychologist (2 wheels) Additional Comments: could stay with cousin but she has stairs to enter her home. She could potentially come stay with him but he reports he does not live close to her work    Prior Function Prior Level of Function : Independent/Modified Independent;Driving             Mobility Comments: using RW, cousin comes in and checks on pt as needed       Hand Dominance   Dominant Hand: Left    Extremity/Trunk Assessment   Upper Extremity Assessment Upper Extremity Assessment: Defer to OT evaluation    Lower Extremity Assessment Lower Extremity Assessment: Generalized weakness RLE Deficits / Details: able to perform toe flexion/extension, LAQ to full ROM, seated march RLE Sensation: history of peripheral neuropathy;decreased light touch;decreased proprioception    Cervical / Trunk Assessment Cervical / Trunk Assessment: Normal  Communication   Communication: No difficulties  Cognition Arousal/Alertness: Awake/alert Behavior During Therapy: WFL for tasks assessed/performed Overall Cognitive Status: Within Functional Limits for tasks assessed                                          General Comments      Exercises     Assessment/Plan    PT Assessment Patient needs continued PT services  PT Problem List Decreased mobility;Decreased knowledge of precautions;Decreased knowledge of use of DME;Pain;Impaired sensation;Decreased skin integrity;Decreased strength;Decreased  range of motion;Decreased activity tolerance;Decreased balance       PT Treatment Interventions DME instruction;Gait training;Functional mobility training;Therapeutic activities;Therapeutic exercise;Balance training;Patient/family education    PT Goals (Current goals can be found in the Care Plan section)  Acute Rehab PT Goals PT Goal Formulation: With patient Time For Goal Achievement: 08/18/22 Potential to Achieve Goals: Good    Frequency Min 3X/week     Co-evaluation               AM-PAC PT "6 Clicks" Mobility  Outcome Measure Help needed turning from your back to your side while in a flat bed without using bedrails?: A Little Help needed moving from lying on your back to sitting on the side of a flat bed without using bedrails?: A Little Help needed moving to and from a bed to a chair (including a wheelchair)?: A Little Help needed standing up from a chair using your arms (e.g., wheelchair or bedside chair)?: A Little Help needed to walk in hospital room?: A Little Help needed climbing 3-5 steps with a railing? : A Lot 6 Click Score: 17    End of Session   Activity Tolerance: Patient tolerated treatment well Patient left: in bed;with call bell/phone within reach;with bed alarm set Nurse Communication: Mobility status PT Visit Diagnosis: Difficulty in walking, not elsewhere classified (R26.2);Pain Pain - Right/Left: Right Pain - part of body: Ankle and joints of foot    Time: 1610-9604 PT Time Calculation (min) (ACUTE ONLY): 13 min  Charges:   PT Evaluation $PT Eval Low Complexity: 1 Low          Jewell Ryans S, PT DPT Acute Rehabilitation Services Secure Chat Preferred  Office [phone]   Azusena Erlandson Sheliah Plane 08/04/2022, 2:04 PM

## 2022-08-04 NOTE — Evaluation (Signed)
Occupational Therapy Evaluation Patient Details Name: Caleb [name] MRN: [id number] DOB: Oct 25, 1950 Today's Date: 08/04/2022   History of Present Illness Pt is 72 yo male who presents 08/03/2022 reporting his wound vac was not working, experiencing nausea/vomiting, and AMS. Recent admission with R calcaneal I&D on 07/29/22. PMH: CHF, HTN, HLD, DM2, GERD, PVD, pancreatitis, s/p TAVR   Clinical Impression   Recently home for 1/2 day with max concerns about wound vac, so returning; denies ever having any confusion other than regarding wound vac. Cognition WFL during session. Presenting at similar functional level as when discharged; requiring min guard A for basic transfers, min guard-min A for LB ADL, and set-up for UB ADL. Pt with decreased understanding of need for mobility acutely stating "I am just going to go home and sit down". Education provided regarding risk of further decline in functional status. Skilled rehab >3 hours per day recommended during last admission and continue to believe pt could benefit, but pt reporting he was unable to get inpatient rehabilitation, and thus [facility] has been set up; he feels confident going home with [facility] services. Will continue to follow acutely.      Recommendations for follow up therapy are one component of a multi-disciplinary discharge planning process, led by the attending physician.  Recommendations may be updated based on patient status, additional functional criteria and insurance authorization.   Assistance Recommended at Discharge Intermittent Supervision/Assistance  Patient can return home with the following A little help with walking and/or transfers;A little help with bathing/dressing/bathroom;A lot of help with bathing/dressing/bathroom;Assistance with cooking/housework;Assist for transportation;Help with stairs or ramp for entrance    Functional Status Assessment  Patient has had a recent decline in their functional status and demonstrates the  ability to make significant improvements in function in a reasonable and predictable amount of time.  Equipment Recommendations  BSC/3in1 (if pt does not alread have one from recent admission)    Recommendations for Other Services       Precautions / Restrictions Precautions Precautions: None Required Braces or Orthoses: Other Brace Other Brace: PRAFO R foot Restrictions Weight Bearing Restrictions: Yes RLE Weight Bearing: Non weight bearing Other Position/Activity Restrictions: Per PA note 5/16 may be minimal touchdown weightbearing on R foot      Mobility Bed Mobility Overal bed mobility: Modified Independent             General bed mobility comments: pt able to come safely to EOB without assist    Transfers Overall transfer level: Needs assistance Equipment used: Rolling walker (2 wheels) Transfers: Sit to/from Stand Sit to Stand: Min assist     Step pivot transfers: Min guard     General transfer comment: Cues for hand placment. min guard A for safety      Balance Overall balance assessment: Mild deficits observed, not formally tested                                         ADL either performed or assessed with clinical judgement   ADL Overall ADL's : Needs assistance/impaired Eating/Feeding: Modified independent;Sitting   Grooming: Set up;Sitting   Upper Body Bathing: Supervision/ safety;Sitting   Lower Body Bathing: Min guard;Sitting/lateral leans   Upper Body Dressing : Set up;Sitting   Lower Body Dressing: Min guard;Sitting/lateral leans   Toilet Transfer: Min guard;Stand-pivot;Rolling walker (2 wheels);BSC/3in1   Toileting- Clothing Manipulation and Hygiene: Set up;Sitting/lateral lean  Functional mobility during ADLs: Min guard;Rolling walker (2 wheels)       Vision Baseline Vision/History: 1 Wears glasses Ability to See in Adequate Light: 0 Adequate Patient Visual Report: No change from baseline Vision  Assessment?: No apparent visual deficits     Perception Perception Perception Tested?: No   Praxis Praxis Praxis tested?: Not tested    Pertinent Vitals/Pain Pain Assessment Pain Assessment: Faces Faces Pain Scale: Hurts even more Pain Location: R foot Pain Descriptors / Indicators: Sore, Guarding, Grimacing Pain Intervention(s): Limited activity within patient's tolerance, Monitored during session, RN gave pain meds during session     Hand Dominance Left   Extremity/Trunk Assessment Upper Extremity Assessment Upper Extremity Assessment: Generalized weakness (neuropathy of BUE. Able to use functionally, but overlall 3+/5 grip strength. Using Built up grips for silverware to self feed in home setting)   Lower Extremity Assessment Lower Extremity Assessment: Defer to PT evaluation   Cervical / Trunk Assessment Cervical / Trunk Assessment: Normal   Communication Communication Communication: No difficulties   Cognition Arousal/Alertness: Awake/alert Behavior During Therapy: WFL for tasks assessed/performed Overall Cognitive Status: Within Functional Limits for tasks assessed                                 General Comments: Slightly impulsive, likes to direct his own care and activity     General Comments  Continue to believe pt could benefit from inpatient rehabilitation <3 hours/day. Pt reports they were unable to get him this during previous admission and that now he would rather go home as he has [facility] services set-up    Exercises     Shoulder Instructions      Home Living Family/patient expects to be discharged to:: Private residence Living Arrangements: Alone Available Help at Discharge: Family;Available PRN/intermittently Type of Home: House Home Access: Level entry     Home Layout: One level     Bathroom Shower/Tub: Chief Strategy Officer: Standard     Home Equipment: Crutches;Cane - single point;Wheelchair - Geophysicist/field seismologist (2 wheels)          Prior Functioning/Environment Prior Level of Function : Independent/Modified Independent;Driving             Mobility Comments: independent ADLs Comments: independent; pt reports not home long enough since last admission to know how home was going with his weightbearing status        OT Problem List: Decreased strength;Impaired balance (sitting and/or standing);Decreased activity tolerance;Pain;Decreased safety awareness;Decreased knowledge of use of DME or AE;Decreased knowledge of precautions      OT Treatment/Interventions: Therapeutic exercise;Self-care/ADL training;DME and/or AE instruction;Balance training;Patient/family education;Therapeutic activities    OT Goals(Current goals can be found in the care plan section) Acute Rehab OT Goals Patient Stated Goal: get better OT Goal Formulation: With patient Time For Goal Achievement: 08/18/22 Potential to Achieve Goals: Good  OT Frequency: Min 2X/week    Co-evaluation              AM-PAC OT "6 Clicks" Daily Activity     Outcome Measure Help from another person eating meals?: None Help from another person taking care of personal grooming?: A Little Help from another person toileting, which includes using toliet, bedpan, or urinal?: A Little Help from another person bathing (including washing, rinsing, drying)?: A Little Help from another person to put on and taking off regular upper body clothing?: A Little Help from another person  to put on and taking off regular lower body clothing?: A Little 6 Click Score: 19   End of Session Equipment Utilized During Treatment: Rolling walker (2 wheels) Nurse Communication: Mobility status (RN gave pain medication during session)  Activity Tolerance: Patient tolerated treatment well;Treatment limited secondary to agitation (agitated by being asked to do things by nursing, not having breakfast, etc) Patient left: in bed;with call bell/phone within  reach;with bed alarm set  OT Visit Diagnosis: Unsteadiness on feet (R26.81);Muscle weakness (generalized) (M62.81);Other abnormalities of gait and mobility (R26.89)                Time: 4132-4401 OT Time Calculation (min): 26 min Charges:  OT General Charges $OT Visit: 1 Visit OT Evaluation $OT Eval Low Complexity: 1 Low OT Treatments $Self Care/Home Management : 8-22 mins  Caleb [name], OTR/L [facility] Acute Rehabilitation Office: [phone]   Caleb [name] 08/04/2022, 9:09 AM

## 2022-08-04 NOTE — Consult Note (Signed)
WOC consulted for NPWT dressing, see notes Patient seen by Dr. Lajoyce Corners last evening, no further dressing changes needed per Dr. Audrie Lia notes.   Will sign off.  Saraiyah Hemminger [facility], CNS, [employer] [phone]

## 2022-08-04 NOTE — Hospital Course (Signed)
Caleb [name] is a 72 y.o. year old male with a history of type 2 diabetes mellitus (A1c 7.5), hypertension, hyperlipidemia, s/p L 4th and 5th  toe metatarsal amputation, PAD status post b/l popliteal artery intervention, bilateral carotid artery stenosis status post bilateral  transcarotid artery revascularization (in 11/2019 and 01/2020), HFrEF (EF 20%, global hypokinesis, grade 2 DD, down from 50-55% previously) AVR aortic valve stenosis s/p TVAR from 10/2021  , b/l carotid stenosis (status post TCAR).  He underwent right superficial femoral artery endarterectomy with superficial femoral artery to anterior tibial artery bypass in 05/2022 due to critical limb ischemia with tissue loss of the heel.  Also underwent partial calcaneal excision and debridement by orthopedic due to presence of subacute osteomyelitis.

## 2022-08-04 NOTE — Consult Note (Addendum)
[facility] NOTE   MRN : [id number]  Reason for Consult: s/p right fem to AT bypass with calcaneous intervention by Dr. Lajoyce Corners Referring Physician: Lajoyce Corners  History of Present Illness: This is a 72 y.o. male who is s/p right superficial femoral artery endarterectomy with superficial femoral artery to anterior tibial artery bypass using vein by Dr. Karin Lieu on 06/17/2022 due to critical limb ischemia with tissue loss of the heel.  He subsequently underwent debridement of the right heel with partial excision by Dr. Lajoyce Corners during the same hospitalization.   He reported to the ED for exam and concerns of the right heel wound vac malfunction.  He deneis new wounds, intact active motion of the toes on the right foot he is minimal touch down weight bearing on the right.       Current Facility-Administered Medications  Medication Dose Route Frequency Provider Last Rate Last Admin   0.9 %  sodium chloride infusion (Manually program via Guardrails IV Fluids)   Intravenous Once Clydie Braun, MD       acetaminophen (TYLENOL) tablet 650 mg  650 mg Oral Q6H PRN Mansy, Jan A, MD   650 mg at 08/03/22 2209   Or   acetaminophen (TYLENOL) suppository 650 mg  650 mg Rectal Q6H PRN Mansy, Jan A, MD       albuterol (PROVENTIL) (2.5 MG/3ML) 0.083% nebulizer solution 2.5 mg  2.5 mg Nebulization Q4H PRN Clydie Braun, MD       aspirin EC tablet 81 mg  81 mg Oral Daily Katrinka Blazing, Rondell A, MD   81 mg at 08/03/22 1719   atorvastatin (LIPITOR) tablet 40 mg  40 mg Oral Daily Mansy, Jan A, MD   40 mg at 08/03/22 1029   carvedilol (COREG) tablet 12.5 mg  12.5 mg Oral BID WC Mansy, Jan A, MD   12.5 mg at 08/03/22 1719   clopidogrel (PLAVIX) tablet 75 mg  75 mg Oral Daily Smith, Rondell A, MD   75 mg at 08/03/22 1719   ferrous sulfate tablet 325 mg  325 mg Oral Q breakfast Katrinka Blazing, Rondell A, MD       furosemide (LASIX) tablet 40 mg  40 mg Oral q1600 Mansy, Jan A, MD   40 mg at 08/03/22 1719    hydrALAZINE (APRESOLINE) tablet 10 mg  10 mg Oral TID Mansy, Jan A, MD   10 mg at 08/03/22 2209   insulin aspart (novoLOG) injection 0-15 Units  0-15 Units Subcutaneous TID WC Smith, Rondell A, MD   5 Units at 08/03/22 1719   insulin aspart (novoLOG) injection 0-5 Units  0-5 Units Subcutaneous QHS Smith, Rondell A, MD       isosorbide mononitrate (IMDUR) 24 hr tablet 30 mg  30 mg Oral Daily Mansy, Jan A, MD       morphine (PF) 2 MG/ML injection 2 mg  2 mg Intravenous Q4H PRN Mansy, Jan A, MD   2 mg at 08/04/22 0407   ondansetron (ZOFRAN) tablet 4 mg  4 mg Oral Q6H PRN Mansy, Jan A, MD   4 mg at 08/03/22 2209   Or   ondansetron (ZOFRAN) injection 4 mg  4 mg Intravenous Q6H PRN Mansy, Jan A, MD       pantoprazole (PROTONIX) injection 40 mg  40 mg Intravenous Q12H Smith, Rondell A, MD   40 mg at 08/03/22 2209   sacubitril-valsartan (ENTRESTO) 49-51 mg per tablet  1 tablet Oral BID Mansy, Jan  A, MD   1 tablet at 08/03/22 2210   sodium chloride flush (NS) 0.9 % injection 3 mL  3 mL Intravenous Q12H Smith, Rondell A, MD   3 mL at 08/03/22 2210   traZODone (DESYREL) tablet 25 mg  25 mg Oral QHS PRN Mansy, Jan A, MD   25 mg at 08/03/22 2209    Pt meds include: Statin :Yes Betablocker: Yes ASA: Yes Other anticoagulants/antiplatelets: Eliquis  Past Medical History:  Diagnosis Date   Carotid artery occlusion    CHF (congestive heart failure) (HCC)    Diabetes mellitus without complication (HCC)    Type II   Dyslipidemia 09/27/2012   Erectile dysfunction 09/27/2012   GERD (gastroesophageal reflux disease)    Hypercholesteremia    Hyperlipidemia 08/12/2012   Hypertension    Hyponatremia 08/14/2012   MGUS (monoclonal gammopathy of unknown significance)    Neuropathy    Pancreatitis    Pancreatitis, acute 09/27/2012   Peripheral vascular disease (HCC)    S/P 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 Excell Seltzer & Bartle    Severe aortic stenosis    Vitamin D deficiency 06/23/2015    Past Surgical History:  Procedure Laterality Date   ABDOMINAL AORTOGRAM W/LOWER EXTREMITY N/A 08/08/2019   Procedure: ABDOMINAL AORTOGRAM W/LOWER EXTREMITY;  Surgeon: Runell Gess, MD;  Location: [facility];  Service: Cardiovascular;  Laterality: N/A;   ABDOMINAL AORTOGRAM W/LOWER EXTREMITY N/A 11/18/2019   Procedure: ABDOMINAL AORTOGRAM W/LOWER EXTREMITY;  Surgeon: Runell Gess, MD;  Location: [facility];  Service: Cardiovascular;  Laterality: N/A;   ABDOMINAL AORTOGRAM W/LOWER EXTREMITY N/A 06/13/2022   Procedure: ABDOMINAL AORTOGRAM W/LOWER EXTREMITY;  Surgeon: Chuck Hint, MD;  Location: [facility] INVASIVE CV LAB;  Service: Cardiovascular;  Laterality: N/A;   APPLICATION OF WOUND VAC  06/24/2022   Procedure: APPLICATION OF WOUND VAC;  Surgeon: Nadara Mustard, MD;  Location: [facility] OR;  Service: Orthopedics;;   CARDIAC CATHETERIZATION     COLONOSCOPY  12 years ago   in [place] clinic= normal exam per pt   ENDARTERECTOMY FEMORAL Right 06/17/2022   Procedure: SUPERFICIAL ENDARTERECTOMY OF COMMON FEMORAL ARTERY;  Surgeon: Victorino Sparrow, MD;  Location: [facility] OR;  Service: Vascular;  Laterality: Right;   FEMORAL-TIBIAL BYPASS GRAFT Right 06/17/2022   Procedure: RIGHT FEMORAL- ANTERIOR TIBIAL ARTERY BYPASS;  Surgeon: Victorino Sparrow, MD;  Location: [facility] OR;  Service: Vascular;  Laterality: Right;   I & D EXTREMITY Right 06/24/2022   Procedure: RIGHT PARTIAL CALCANEUS EXCISION;  Surgeon: Nadara Mustard, MD;  Location: [facility] OR;  Service: Orthopedics;  Laterality: Right;   I & D EXTREMITY Right 07/29/2022   Procedure: RIGHT PARTIAL CALCANEUS EXCISION;  Surgeon: Nadara Mustard, MD;  Location: [facility] OR;  Service: Orthopedics;  Laterality: Right;   INTRAOPERATIVE TRANSTHORACIC ECHOCARDIOGRAM N/A 01/11/2022   Procedure: INTRAOPERATIVE TRANSTHORACIC ECHOCARDIOGRAM;  Surgeon: Tonny Bollman, MD;  Location: [facility] INVASIVE CV LAB;   Service: Open Heart Surgery;  Laterality: N/A;   KNEE SURGERY     MULTIPLE EXTRACTIONS WITH ALVEOLOPLASTY N/A 12/09/2021   Procedure: MULTIPLE EXTRACTION;  Surgeon: Sharman Cheek, DMD;  Location: [facility] OR;  Service: Dentistry;  Laterality: N/A;   PERIPHERAL VASCULAR INTERVENTION Right 08/08/2019   Procedure: PERIPHERAL VASCULAR INTERVENTION;  Surgeon: Runell Gess, MD;  Location: [facility];  Service: Cardiovascular;  Laterality: Right;   PERIPHERAL VASCULAR INTERVENTION Left 11/18/2019  Procedure: PERIPHERAL VASCULAR INTERVENTION;  Surgeon: Runell Gess, MD;  Location: [facility] INVAS[facility] CV LAB;  Service: Cardiovascular;  Laterality: Left;  left popliteal artery   RIGHT/LEFT HEART CATH AND CORONARY ANGIOGRAPHY N/A 10/29/2021   Procedure: RIGHT/LEFT HEART CATH AND CORONARY ANGIOGRAPHY;  Surgeon: Kathleene Hazel, MD;  Location: [facility];  Service: Cardiovascular;  Laterality: N/A;   TRANSCAROTID ARTERY REVASCULARIZATION  Left 12/16/2019   Procedure: TRANSCAROTID ARTERY REVASCULARIZATION;  Surgeon: Sherren Kerns, MD;  Location: [facility] OR;  Service: Vascular;  Laterality: Left;   TRANSCAROTID ARTERY REVASCULARIZATION  Right 02/03/2020   Procedure: RIGHT TRANSCAROTID ARTERY REVASCULARIZATION;  Surgeon: Sherren Kerns, MD;  Location: [facility] OR;  Service: Vascular;  Laterality: Right;   TRANSCATHETER AORTIC VALVE REPLACEMENT, TRANSFEMORAL N/A 01/11/2022   Procedure: Transcatheter Aortic Valve Replacement, Transfemoral;  Surgeon: Tonny Bollman, MD;  Location: [facility] INVASIVE CV LAB;  Service: Open Heart Surgery;  Laterality: N/A;   ULTRASOUND GUIDANCE FOR VASCULAR ACCESS Right 12/16/2019   Procedure: ULTRASOUND GUIDANCE FOR VASCULAR ACCESS;  Surgeon: Sherren Kerns, MD;  Location: [facility] OR;  Service: Vascular;  Laterality: Right;   ULTRASOUND GUIDANCE FOR VASCULAR ACCESS Right 02/03/2020   Procedure: ULTRASOUND GUIDANCE FOR VASCULAR ACCESS;  Surgeon: Sherren Kerns, MD;   Location: [facility] OR;  Service: Vascular;  Laterality: Right;   VASECTOMY     VEIN HARVEST Right 06/17/2022   Procedure: IPSILATERAL RIGHT GREATER SAPHENOUS VEIN HARVEST;  Surgeon: Victorino Sparrow, MD;  Location: [facility] OR;  Service: Vascular;  Laterality: Right;    Social History Social History   Tobacco Use   Smoking status: Never    Passive exposure: Never   Smokeless tobacco: Never  Vaping Use   Vaping Use: Never used  Substance Use Topics   Alcohol use: Yes    Comment: occasional   Drug use: No    Family History Family History  Problem Relation Age of Onset   CAD Father    Hypertension Father    Alcohol abuse Father        Cause of death   Diabetes Mother    Colon polyps Mother    CAD Brother 31       CABG   Colon cancer Neg Hx    Esophageal cancer Neg Hx    Rectal cancer Neg Hx    Stomach cancer Neg Hx     Allergies  Allergen Reactions   Codeine Nausea And Vomiting   Percocet [Oxycodone-Acetaminophen] Nausea And Vomiting   Tramadol Hcl Nausea And Vomiting     REVIEW OF SYSTEMS  General: [ ]  Weight loss, [ ]  Fever, [ ]  chills Neurologic: [ ]  Dizziness, [ ]  Blackouts, [ ]  Seizure [ ]  Stroke, [ ]  "Mini stroke", [ ]  Slurred speech, [ ]  Temporary blindness; [ ]  weakness in arms or legs, [ ]  Hoarseness [ ]  Dysphagia Cardiac: [ ]  Chest pain/pressure, [ ]  Shortness of breath at rest [ ]  Shortness of breath with exertion, [ ]  Atrial fibrillation or irregular heartbeat  Vascular: [ ]  Pain in legs with walking, [ ]  Pain in legs at rest, [ ]  Pain in legs at night,  [x ] Non-healing ulcer, [ ]  Blood clot in vein/DVT,   Pulmonary: [ ]  Home oxygen, [ ]  Productive cough, [ ]  Coughing up blood, [ ]  Asthma,  [ ]  Wheezing [ ]  COPD Musculoskeletal:  [ ]  Arthritis, [ ]  Low back pain, [ ]  Joint pain Hematologic: [ ]  Easy Bruising, [ x] Anemia; [ ]   Hepatitis Gastrointestinal: [ ]  Blood in stool, [ ]  Gastroesophageal Reflux/heartburn, Urinary: [ ]  chronic Kidney disease, [ ]  on HD - [  ] MWF or [ ]  TTHS, [ ]  Burning with urination, [ ]  Difficulty urinating Skin: [ ]  Rashes, [ ]  Wounds Psychological: [ ]  Anxiety, [ ]  Depression  Physical Examination Vitals:   08/03/22 1930 08/04/22 0051 08/04/22 0432 08/04/22 0500  BP: 109/78 102/68 107/68   Pulse: 87 86 89   Resp: 16 16 16    Temp: 98.5 F (36.9 C) 98.3 F (36.8 C) 98.5 F (36.9 C)   TempSrc: Oral Oral Oral   SpO2: 100% 92% 100%   Weight:    80.9 kg  Height:       Body mass index is 27.93 kg/m.  General:  WDWN in NAD  HENT: WNL Eyes: Pupils equal Pulmonary: normal non-labored breathing , without Rales, rhonchi,  wheezing Cardiac: RRR, without  Murmurs, rubs or gallops; No carotid bruits Abdomen: soft, NT, no masses Skin: right groin soft and healed, lower leg incision healing without ischemic changes.   Vascular Exam/Pulses Doppler signal right AT/peroneal  Clean dry dressing on right foot/ankle, active motion of the toes and grossly sensation to light touch intact, toes warm.   Musculoskeletal: no muscle wasting or atrophy; no edema  Neurologic: A&O X 3; Appropriate Affect ;  SENSATION: normal;  Speech is fluent/normal   Significant Diagnostic Studies: CBC Lab Results  Component Value Date   WBC 9.4 08/03/2022   HGB 8.5 (L) 08/03/2022   HCT 26.4 (L) 08/03/2022   MCV 81.6 08/03/2022   PLT 359 08/03/2022    BMET    Component Value Date/Time   NA 131 (L) 08/03/2022 1036   NA 142 02/16/2022 1544   K 4.6 08/03/2022 1036   CL 95 (L) 08/03/2022 1036   CO2 24 08/03/2022 1036   GLUCOSE 144 (H) 08/03/2022 1036   BUN 47 (H) 08/03/2022 1036   BUN 22 02/16/2022 1544   CREATININE 1.51 (H) 08/03/2022 1036   CREATININE 1.36 (H) 03/09/2022 1233   CREATININE 0.86 06/06/2016 1455   CALCIUM 8.5 (L) 08/03/2022 1036   GFRNONAA 49 (L) 08/03/2022 1036   GFRNONAA 56 (L) 03/09/2022 1233   GFRNONAA 87 04/02/2013 1632   GFRAA >60 12/17/2019 0354   GFRAA >89 04/02/2013 1632   Estimated Creatinine  Clearance: 45 mL/min (A) (by C-G formula based on SCr of 1.51 mg/dL (H)).  COAG Lab Results  Component Value Date   INR 1.0 01/11/2022   INR 0.9 01/30/2020   INR 1.0 12/11/2019       ASSESSMENT/PLAN:  This is a 72 y.o. male who is s/p: Right SFA endarterectomy with SFA to anterior tibial artery bypass with vein. Patent bypass with AT/peroneal signals.  He is still at high risk of further amputation pending healing of the calcaneous wound.   F/U appt. with [facility] is scheduled for 08/19/22.     DR. Lajoyce Corners inspected the right heel wound and discontinued the wound vac.  He will now use dry dressings until f/u with DR. Duda OP next week.     Mosetta Pigeon 08/04/2022 7:15 AM

## 2022-08-04 NOTE — Progress Notes (Addendum)
Assessed patient for discharge barriers. Spoke with the charge nurse who is assisting with discharges on the unit. Was informed the patient's discharge order reconciliation isn't completed. Messaged the doctor via secure chat to inform him that it needs to be completed. Md. was awaiting a response from patient's RN about wound care instructions from the ortho surgeon. Spoke with Amy the patient's RN who contacted the the ortho surgeon. She will reach out to patient's rounding Md to inform him that there are not discharging wound dsg care needed before seeing the surgeon in his office for follow-up. Patient is expected to receive Home Health according to his RN.   Orvan Seen SWOT RN

## 2022-08-04 NOTE — Discharge Summary (Signed)
Physician Discharge Summary   Patient: Caleb [name] MRN: [id number] DOB: Dec 28, 1950  Admit date:     08/02/2022  Discharge date: 08/04/22  Discharge Physician: Alberteen Sam   PCP: Hoy Register, [name]     Recommendations at discharge:  Follow up with Orthopedics Dr. Lajoyce Corners in 3 days for dressing change Follow up with Vascular Surgery Dr. Karin Lieu as planned Follow up with Infectious Disease (missed last appointment) Please refer to Orthopedics, Vascular Surgery and Infectious Disease in [place] prior to moving in June     Discharge Diagnoses: Principal Problem:   Dislodged wound vac apparatus Active Problems:   Anemia   History of osteomyelitis   Chronic systolic congestive heart failure   Type 2 diabetes mellitus with hyperlipidemia (HCC)   HTN (hypertension)   Stage IIIb chronic kidney disease (HCC)   Peripheral vascular disease (HCC)   Acute metabolic encpehalopathy ruled out     Hospital Course: Caleb [name] is a 72 y.o. M with DM, HTN, HLD, PVD (bilateral TCAR, bilateral popliteal artery stents, right fem-tib bypass, and hx amputation of L 4th and 5th toes as well as right partial calcaneus excision), sCHF EF 20%, AV stenosis s/p TAVR 10/2021 and recent admission for dehiscence of his recent right partial calcaneus excision wound who returned same day as discahrge due to clogged wound vac.  Evidently, vomited shortly after getting home from the hospital on 5/14.  Took a nap, felt better when he awoke but noticed his wound vac appeared disconnected and/or wasn't draining properly and was alarming, so he called 9-1-1 to fix it, and they just brought him to the hospital.  There is some mention of him being "confused" on admission, but he reports he was at no time confused, just "confused about how the wound vac was supposed to be hooked up".   Patient was admitted and orthopedics were consulted.  They removed the wound vac, applied a dressing and recommended it stay in  place until his follow up appointment next Monday or Tuesday.  TOC were consulted and verified the patient had a safe disposition and HH services available.           The [facility] Controlled Substances Registry was reviewed for this patient prior to discharge.  Consultants: Orthopedics Procedures performed: None  Disposition: Home health Diet recommendation:  Cardiac and Carb modified diet  DISCHARGE MEDICATION: Allergies as of 08/04/2022       Reactions   Codeine Nausea And Vomiting   Percocet [oxycodone-acetaminophen] Nausea And Vomiting   Tramadol Hcl Nausea And Vomiting        Medication List     STOP taking these medications    clopidogrel 75 MG tablet Commonly known as: PLAVIX       TAKE these medications    apixaban 5 MG Tabs tablet Commonly known as: ELIQUIS Take 1 tablet (5 mg total) by mouth 2 (two) times daily.   Artificial Tears PF 0.1-0.3 % Soln Generic drug: Dextran 70-Hypromellose (PF) Place 1 drop into both eyes every 8 (eight) hours as needed (for dryness).   aspirin EC 81 MG tablet Take 81 mg by mouth in the morning.   atorvastatin 40 MG tablet Commonly known as: LIPITOR Take 40 mg by mouth in the morning.   BETADINE ANTISEPTIC EX Apply 1 application  topically See admin instructions. Use as directed every other day for wound care (right heel site)   carvedilol 12.5 MG tablet Commonly known as: COREG Take 1 tablet (12.5 mg  total) by mouth 2 (two) times daily with a meal.   cyanocobalamin 500 MCG tablet Commonly known as: VITAMIN B12 Take 500 mcg by mouth in the morning.   Entresto 49-51 MG Generic drug: sacubitril-valsartan Take 1 tablet by mouth 2 (two) times daily.   ferrous sulfate 325 (65 FE) MG tablet Take 325 mg by mouth daily with breakfast.   FreeStyle Libre 14 Day Sensor Misc Inject 1 Device into the skin every 14 (fourteen) days.   furosemide 80 MG tablet Commonly known as: LASIX Take 1 tablet (80 mg  total) by mouth in the morning. And 1 tablet (40 mg) in the evening   furosemide 40 MG tablet Commonly known as: LASIX Take 1 tablet (40 mg total) by mouth daily at 4 PM. And 1 tablet (80 mg) daily in the morning   gabapentin 800 MG tablet Commonly known as: NEURONTIN Take 800 mg by mouth 3 (three) times daily.   hydrALAZINE 10 MG tablet Commonly known as: APRESOLINE Take 1 tablet (10 mg total) by mouth 3 (three) times daily.   HYDROcodone-acetaminophen 5-325 MG tablet Commonly known as: NORCO/VICODIN Take 1 tablet by mouth every 4 (four) hours as needed.   insulin NPH-regular Human (70-30) 100 UNIT/ML injection Inject 7-8 Units into the skin See admin instructions. Inject 8 units into the skin before breakfast and 7 units before the evening meal   isosorbide mononitrate 30 MG 24 hr tablet Commonly known as: IMDUR Take 1 tablet (30 mg total) by mouth daily.   NON FORMULARY Take 16 oz by mouth See admin instructions. [employer] Signature Organic Raw Kombucha, Ginger Lemonade- Drink 16 ounces by mouth once a day   ondansetron 4 MG tablet Commonly known as: Zofran Take 1 tablet (4 mg total) by mouth every 8 (eight) hours as needed for nausea or vomiting.   polyethylene glycol 17 g packet Commonly known as: MIRALAX / GLYCOLAX Take 17 g by mouth daily. What changed:  when to take this reasons to take this   PRESCRIPTION MEDICATION Inject 0.1-1 mLs as directed See admin instructions. Tri-Mix Standard Strength 5 ml. Formula: Prostaglandin 10 mcg/ml, Papaverine 30 mg/ml, Phentolamine 1 mg/ml. Inject 0.1 ml into side of penis as directed as needed (to be injected immediately before sexual intercourse) may increase the dose by 0.1 ml every 48 hours to achieve an erection. Max dose 1 ml.   Vitamin D-3 25 MCG (1000 UT) Caps Take 1,000 Units by mouth daily.               Discharge Care Instructions  (From admission, onward)           Start     Ordered   08/04/22 0000   Discharge wound care:       Comments: Leave dressing in place until your appointment on Monday with Dr. Lajoyce Corners, then follow all wound care instructions from Dr. Audrie Lia office   08/04/22 1218            Follow-up Information     Caleb [name] Follow up in 1 week(s).   Specialty: Orthopedic Surgery Why: Call for appointment to follow-up in the office Monday or Tuesday. Contact information: [address] [place] [address] [phone]         Care, [facility] Follow up.   Specialty: Home Health Services Why: [facility] will follow up for home health services in the next 24-48 hours. Contact information: [address] [place] [address] [phone]  Victorino Sparrow, [name]. Schedule an appointment as soon as possible for a visit in 1 week(s).   Specialty: Vascular Surgery Contact information: [address] [place] [address] [phone]         Judyann Munson, [name]. Schedule an appointment as soon as possible for a visit.   Specialty: Infectious Diseases Contact information: [address] [place] [address] [phone]                 Discharge Instructions     Discharge instructions   Complete by: As directed    **IMPORTANT DISCHARGE INSTRUCTIONS**   From Dr. Maryfrances Bunnell: You were admitted for malfunction of your wound vac.  You should follow the wound care instructions below  IMPORTANT: You should be taking the blood thinners aspirin and apixaban/Eliquis  Do NOT take Plavix/clopidogrel  Continue aspirin 81 mg daily  START apixaban/Eliquis 5 mg twice daily  Make follow up appointments with your orthopedic surgeon (Dr. Lajoyce Corners), your vascular surgeon (Dr. Karin Lieu) and your infectious disease doctor (Dr. Drue Second)  Do not move to [place] without having appointments with a vascular doctor and orthopedic doctor down there first.  Make sure you have those appointments scheduled now, so  that when you get down there, you don't have to wait months to see them.   Discharge wound care:   Complete by: As directed    Leave dressing in place until your appointment on Monday with Dr. Lajoyce Corners, then follow all wound care instructions from Dr. Audrie Lia office   Increase activity slowly   Complete by: As directed        Discharge Exam: Filed Weights   08/02/22 2300 08/04/22 0500  Weight: 73 kg 80.9 kg    General: Pt is alert, awake, not in acute distress Cardiovascular: RRR, nl S1-S2, no murmurs appreciated.   No LE edema.   Respiratory: Normal respiratory rate and rhythm.  CTAB without rales or wheezes. Abdominal: Abdomen soft and non-tender.  No distension or HSM.   Neuro/Psych: Strength symmetric in upper and lower extremities.  Judgment and insight appear normal.   Condition at discharge: good  The results of significant diagnostics from this hospitalization (including imaging, microbiology, ancillary and laboratory) are listed below for reference.   Imaging Studies: CT ABDOMEN PELVIS W CONTRAST  Result Date: 08/03/2022 CLINICAL DATA:  Abdominal pain. EXAM: CT ABDOMEN AND PELVIS WITH CONTRAST TECHNIQUE: Multidetector CT imaging of the abdomen and pelvis was performed using the standard protocol following bolus administration of intravenous contrast. RADIATION DOSE REDUCTION: This exam was performed according to the departmental dose-optimization program which includes automated exposure control, adjustment of the mA and/or kV according to patient size and/or use of iterative reconstruction technique. CONTRAST:  75mL OMNIPAQUE IOHEXOL 350 MG/ML SOLN COMPARISON:  May 27, 2020 FINDINGS: Lower chest: An artificial aortic valve is seen. Hepatobiliary: No focal liver abnormality is seen. No gallstones, gallbladder wall thickening, or biliary dilatation. Pancreas: Unremarkable. No pancreatic ductal dilatation or surrounding inflammatory changes. Spleen: Normal in size without focal  abnormality. Adrenals/Urinary Tract: Adrenal glands are unremarkable. Kidneys are normal, without renal calculi, focal lesion, or hydronephrosis. Bladder is unremarkable. Stomach/Bowel: The stomach is poorly distended. Mild thickening of the gastric antrum is noted. The appendix appears normal. Stool is seen throughout the large bowel. No evidence of bowel wall thickening, distention, or inflammatory changes. Vascular/Lymphatic: Aortic atherosclerosis. No enlarged abdominal or pelvic lymph nodes. Reproductive: The prostate gland is mildly enlarged. Other: No abdominal wall hernia or abnormality. No  abdominopelvic ascites. Musculoskeletal: Stable, increased trabeculation is seen within the pelvis, sacrum and coccyx. No acute osseous abnormalities are identified. IMPRESSION: 1. Mild thickening of the gastric antrum which may be, in part, related to poor gastric distension. Mild gastritis cannot be excluded. 2. Stable, increased trabeculation within the pelvis, sacrum and coccyx, likely representing Paget's disease. 3. Aortic atherosclerosis. 4. Prostatomegaly. Aortic Atherosclerosis (ICD10-I70.0). Electronically Signed   By: Aram Candela M.D.   On: 08/03/2022 03:14   CT Head Wo Contrast  Result Date: 08/03/2022 CLINICAL DATA:  Altered mental status. EXAM: CT HEAD WITHOUT CONTRAST TECHNIQUE: Contiguous axial images were obtained from the base of the skull through the vertex without intravenous contrast. RADIATION DOSE REDUCTION: This exam was performed according to the departmental dose-optimization program which includes automated exposure control, adjustment of the mA and/or kV according to patient size and/or use of iterative reconstruction technique. COMPARISON:  November 13, 2019 FINDINGS: Brain: There is mild cerebral atrophy with widening of the extra-axial spaces and ventricular dilatation. There are areas of decreased attenuation within the white matter tracts of the supratentorial brain, consistent  with microvascular disease changes. Vascular: No hyperdense vessel or unexpected calcification. Skull: Normal. Negative for fracture or focal lesion. Sinuses/Orbits: No acute finding. Other: None. IMPRESSION: 1. No acute intracranial abnormality. 2. Generalized cerebral atrophy and microvascular disease changes of the supratentorial brain. Electronically Signed   By: Aram Candela M.D.   On: 08/03/2022 03:09   DG ABD ACUTE 2+V W 1V CHEST  Result Date: 08/03/2022 CLINICAL DATA:  Vomiting EXAM: DG ABDOMEN ACUTE WITH 1 VIEW CHEST COMPARISON:  Chest x-ray 05/27/2022 FINDINGS: The heart is enlarged, patient is status post TAVR. The lungs and costophrenic angles are clear. There is no pneumothorax. Bowel-gas pattern is nonobstructive. There is average stool burden. Multiple wires overlie the abdomen. There are no suspicious calcifications. No acute fractures are seen. IMPRESSION: 1. Cardiomegaly without edema or consolidation. 2. Nonobstructive bowel gas pattern. Average stool burden. Electronically Signed   By: Darliss Cheney M.D.   On: 08/03/2022 00:01    Microbiology: Results for orders placed or performed during the hospital encounter of 06/07/22  Culture, blood (Routine X 2) w Reflex to ID Panel     Status: None   Collection Time: 06/08/22  8:28 AM   Specimen: BLOOD  Result Value Ref Range Status   Specimen Description   Final    BLOOD RIGHT ANTECUBITAL Performed at [facility], [address] [place], [place] [address]    Special Requests   Final    BOTTLES DRAWN AEROBIC ONLY Blood Culture adequate volume Performed at [facility], [address] [place], [place] [address]    Culture   Final    NO GROWTH 5 DAYS Performed at [facility], [address], [place], [place] [address]    Report Status 06/13/2022 FINAL  Final  Culture, blood (Routine X 2) w Reflex to ID Panel     Status: None   Collection Time: 06/08/22  8:28 AM   Specimen: BLOOD  RIGHT HAND  Result Value Ref Range Status   Specimen Description   Final    BLOOD RIGHT HAND Performed at [facility], [address], [place], [place] [address]    Special Requests   Final    BOTTLES DRAWN AEROBIC ONLY Blood Culture adequate volume Performed at [facility], [address], [place], [place] [address]    Culture   Final  NO GROWTH 5 DAYS Performed at [facility], [address] [place], [place] [address]    Report Status 06/13/2022 FINAL  Final  Surgical pcr screen     Status: None   Collection Time: 06/16/22  9:23 PM   Specimen: Nasal Mucosa; Nasal Swab  Result Value Ref Range Status   MRSA, PCR NEGATIVE NEGATIVE Final   Staphylococcus aureus NEGATIVE NEGATIVE Final    Comment: (NOTE) The Xpert SA Assay (FDA approved for NASAL specimens in patients 16 years of age and older), is one component of a comprehensive surveillance program. It is not intended to diagnose infection nor to guide or monitor treatment. Performed at [facility], [address], [place], [place] [address]   Aerobic/Anaerobic Culture w Gram Stain (surgical/deep wound)     Status: None   Collection Time: 06/24/22  8:55 AM   Specimen: Soft Tissue, Other  Result Value Ref Range Status   Specimen Description TISSUE  Final   Special Requests  CALCANEUS  Final   Gram Stain NO WBC SEEN RARE GRAM POSITIVE COCCI   Final   Culture   Final    ABUNDANT GROUP B STREP(S.AGALACTIAE)ISOLATED TESTING AGAINST S. AGALACTIAE NOT ROUTINELY PERFORMED DUE TO PREDICTABILITY OF AMP/PEN/VAN SUSCEPTIBILITY. AMONG MIXED ORGANISMS NO ANAEROBES ISOLATED Performed at [facility], [address] [place], [place] [address]    Report Status 06/29/2022 FINAL  Final    Labs: CBC: Recent Labs  Lab 07/30/22 0158 07/31/22 0224 08/02/22 2338 08/03/22 0033 08/03/22 1036 08/03/22 1533  WBC 8.9 9.7 10.7*  --  9.4  --   NEUTROABS  --   --  8.4*  --    --   --   HGB 8.3* 8.6* 9.4* 10.2* 7.8* 8.5*  HCT 25.9* 26.5* 28.9* 30.0* 24.0* 26.4*  MCV 81.7 81.5 83.5  --  81.6  --   PLT 268 267 405*  --  359  --    Basic Metabolic Panel: Recent Labs  Lab 07/29/22 0722 07/30/22 0158 07/31/22 0224 08/02/22 2338 08/03/22 0033 08/03/22 0057 08/03/22 1036  NA 135 129* 129* 129* 129*  --  131*  K 5.0 4.9 4.5 6.3* 6.3* 4.8 4.6  CL 102 95* 96* 92*  --   --  95*  CO2  --  23 23 26   --   --  24  GLUCOSE 121* 126* 149* 178*  --   --  144*  BUN 43* 41* 45* 54*  --   --  47*  CREATININE 2.10* 1.92* 1.67* 1.69*  --   --  1.51*  CALCIUM  --  8.0* 8.6* 9.0  --   --  8.5*   Liver Function Tests: Recent Labs  Lab 08/02/22 2338  AST 29  ALT 19  ALKPHOS 111  BILITOT 1.0  PROT 8.1  ALBUMIN 2.8*   CBG: Recent Labs  Lab 08/03/22 1305 08/03/22 1633 08/03/22 2103 08/04/22 0728 08/04/22 1203  GLUCAP 188* 201* 146* 142* 198*    Discharge time spent: approximately 35 minutes spent on discharge counseling, evaluation of patient on day of discharge, and coordination of discharge planning with nursing, social work, pharmacy and case management  Signed: Alberteen Sam, [name] [facility] 08/04/2022

## 2022-08-04 NOTE — Progress Notes (Signed)
Patient ID: Caleb [name], male   DOB: February 19, 1951, 72 y.o.   MRN: [id number] Patient is status post partial calcaneal excision and placement of tissue graft.  The wound VAC was removed yesterday.  There is significant maceration and drainage trapped beneath the dressing.  Dry dressing applied.  Patient will not need additional dressing changes.  I will follow-up in the office on Monday or Tuesday.  Patient will need to keep the dressing clean and dry.

## 2022-08-04 NOTE — TOC Progression Note (Signed)
Transition of Care [facility]) - Progression Note    Patient Details  Name: Caleb [name] MRN: [id number] Date of Birth: 1951/03/05  [facility] CM/SW Contact  Janae Bridgeman, RN Phone Number: 08/04/2022, 10:42 AM  Clinical Narrative:    CM met with the patient at the bedside and the patient is being discharged home today.  The patient lives alone and does not have a key to his home at this time.  The patient's cousin will provide transportation to the home today after 5 pm and she has key access.  Patient states that he will call her to provide transportation home sooner if she is able.  The patient lives alone and no longer has wound vac needs.  The patient has post-op shoe up to knee noted on Right lower leg.  DME at the home includes WC and RW.  The patient's cousin provides transportation to home using the patient's car.  The patient is active with [facility] and new orders for home health for PT/OT to be co-signed by MD.  Frances Furbish will follow up with the patient.  Patient will discharge home with family assistance today after 5 pm.   Expected Discharge Plan: Home w Home Health Services Barriers to Discharge: No Barriers Identified  Expected Discharge Plan and Services   Discharge Planning Services: CM Consult Post Acute Care Choice: Home Health Living arrangements for the past 2 months: Apartment Expected Discharge Date: 08/04/22                         HH Arranged: PT, OT HH Agency: [facility] Date [facility] Agency Contacted: 08/04/22 Time HH Agency Contacted: 1041 Representative spoke with at [facility] Agency: Kandee Keen, RNCM with [place]   Social Determinants of Health (SDOH) Interventions SDOH Screenings   Food Insecurity: No Food Insecurity (08/03/2022)  Housing: Low Risk  (08/03/2022)  Transportation Needs: No Transportation Needs (08/03/2022)  Utilities: Not At Risk (08/03/2022)  Alcohol Screen: Low Risk  (10/29/2021)  Depression (PHQ2-9): Low Risk   (05/02/2022)  Financial Resource Strain: Low Risk  (10/29/2021)  Physical Activity: Sufficiently Active (12/06/2020)  Social Connections: Moderately Isolated (12/06/2020)  Stress: No Stress Concern Present (12/06/2020)  Tobacco Use: Low Risk  (08/02/2022)    Readmission Risk Interventions     No data to display

## 2022-08-05 ENCOUNTER — Telehealth: Payer: Self-pay

## 2022-08-05 NOTE — Transitions of Care (Post Inpatient/ED Visit) (Signed)
08/05/2022  Name: Caleb [name] MRN: [id number] DOB: February 02, 1951  Today's TOC FU Call Status: Today's TOC FU Call Status:: Successful TOC FU Call Competed TOC FU Call Complete Date: 08/05/22  Transition Care Management Follow-up Telephone Call Date of Discharge: 08/04/22 Discharge Facility: [facility]) Type of Discharge: Inpatient Admission Primary Inpatient Discharge Diagnosis:: "N&V" How have you been since you were released from the hospital?: Better (Brief call w/ pt as he reported he was trying to take a nap-staaes he is "tired and weak but feeling somewhat better." He reports an episode of nausea this am-he took some Zofran and it eased off. He was able to eat a small meal a little while ago.) Any questions or concerns?: No  Items Reviewed: Did you receive and understand the discharge instructions provided?: Yes Medications obtained,verified, and reconciled?: No Medications Not Reviewed Reasons:: Other: (pt trying to take a nap-did not want to do full med review-confirms he has all meds in the home and understands how to take them) Any new allergies since your discharge?: No Dietary orders reviewed?: Yes Type of Diet Ordered:: low salt/heart healthy/card modified Do you have support at home?: Yes People in Home: alone, other relative(s) Name of Support/Comfort Primary Source: cousin helps him out-pt states he has decided to wait a few more months before moving to [place] to be with daughter as he ahs too much going on medically right now to try to switch to new doctors and he extended lease on apt for another 8months  Medications Reviewed Today: Medications Reviewed Today     Reviewed by Larrie Kass, CPhT (Pharmacy Technician) on 08/03/22 at [phone]  Med List Status: Complete   Medication Order Taking? Sig Documenting Provider Last Dose Status Informant  apixaban (ELIQUIS) 5 MG TABS tablet [id number] No Take 1 tablet (5 mg total) by mouth 2 (two) times daily.  Patient not  taking: Reported on 07/25/2022   Hoy Register, MD Not Taking Active Self           Med Note [facility], Corinna Gab Aug 03, 2022  5:31 AM) Hasn't had it for at least 2 months  ARTIFICIAL TEARS PF 0.1-0.3 % SOLN [id number] Yes Place 1 drop into both eyes every 8 (eight) hours as needed (for dryness). [provider] UNKNOWN Active Self  aspirin EC 81 MG tablet [id number] Yes Take 81 mg by mouth in the morning. [provider] 08/02/2022 Active Self  atorvastatin (LIPITOR) 40 MG tablet [id number] Yes Take 40 mg by mouth in the morning. [provider] 08/02/2022 Active Self  carvedilol (COREG) 12.5 MG tablet [id number] Yes Take 1 tablet (12.5 mg total) by mouth 2 (two) times daily with a meal. Hoy Register, MD 08/02/2022 0900 Active Self  Cholecalciferol (VITAMIN D-3) 25 MCG (1000 UT) CAPS [id number] Yes Take 1,000 Units by mouth daily. [provider] Past Week Active Self  clopidogrel (PLAVIX) 75 MG tablet [id number] Yes Take 75 mg by mouth daily. [provider] 08/02/2022 am Active Self  Continuous Blood Gluc Sensor (FREESTYLE LIBRE 14 DAY SENSOR) MISC [id number]  Inject 1 Device into the skin every 14 (fourteen) days. [provider]  Active Self  ferrous sulfate 325 (65 FE) MG tablet [id number] Yes Take 325 mg by mouth daily with breakfast. [provider] Past Week Active Self  furosemide (LASIX) 40 MG tablet [id number] Yes Take 1 tablet (40 mg total) by mouth daily at 4 PM. And 1 tablet (80 mg) daily in the  morning Hoy Register, MD 07/31/2022 pm Active Self  furosemide (LASIX) 80 MG tablet [id number] Yes Take 1 tablet (80 mg total) by mouth in the morning. And 1 tablet (40 mg) in the evening Hoy Register, MD 08/02/2022 am Active Self  gabapentin (NEURONTIN) 800 MG tablet [id number] Yes Take 800 mg by mouth 3 (three) times daily. [provider] 07/30/2022 pm Active Self           Med Note ([facility], Corinna Gab Aug 03, 2022  5:37 AM)  Neuropathy has gotten worse  hydrALAZINE (APRESOLINE) 10 MG tablet [id number] Yes Take 1 tablet (10 mg total) by mouth 3 (three) times daily. Hoy Register, MD 08/02/2022 am Active Self  HYDROcodone-acetaminophen (NORCO/VICODIN) 5-325 MG tablet [id number] Yes Take 1 tablet by mouth every 4 (four) hours as needed. Nadara Mustard, MD Past Month Active Self  insulin NPH-regular Human (70-30) 100 UNIT/ML injection [id number] Yes Inject 7-8 Units into the skin See admin instructions. Inject 8 units into the skin before breakfast and 7 units before the evening meal [provider] 08/02/2022 Active Self  isosorbide mononitrate (IMDUR) 30 MG 24 hr tablet [id number] Yes Take 1 tablet (30 mg total) by mouth daily. Janetta Hora, PA-C 08/01/2022 AM Active Self  NON FORMULARY [id number] Yes Take 16 oz by mouth See admin instructions. [employer] Signature Organic Raw Kombucha, Ginger Lemonade- Drink 16 ounces by mouth once a day [provider] Past Week Active Self  ondansetron (ZOFRAN) 4 MG tablet [id number] Yes Take 1 tablet (4 mg total) by mouth every 8 (eight) hours as needed for nausea or vomiting. Hoy Register, MD UNKNOWN Active Self  polyethylene glycol (MIRALAX / GLYCOLAX) 17 g packet [id number] Yes Take 17 g by mouth daily.  Patient taking differently: Take 17 g by mouth daily as needed for mild constipation.   Osvaldo Shipper, MD Past Week Active Self  Povidone-Iodine (BETADINE ANTISEPTIC EX) [id number] Yes Apply 1 application  topically See admin instructions. Use as directed every other day for wound care (right heel site) [provider] [facility] Active Self  PRESCRIPTION MEDICATION [id number] Yes Inject 0.1-1 mLs as directed See admin instructions. Tri-Mix Standard Strength 5 ml. Formula: Prostaglandin 10 mcg/ml, Papaverine 30 mg/ml, Phentolamine 1 mg/ml. Inject 0.1 ml into side of penis as directed as needed (to be injected immediately before sexual intercourse) may increase the  dose by 0.1 ml every 48 hours to achieve an erection. Max dose 1 ml. [provider] UNKNOWN Active Self           Med Note [facility], Carleene Overlie Jan 17, 2022 11:41 AM)    sacubitril-valsartan (ENTRESTO) 49-51 MG [id number] Yes Take 1 tablet by mouth 2 (two) times daily. Dorthula Nettles, DO 08/02/2022 Active Self           Med Note Arletha Grippe Jul 26, 2022  3:41 PM) Patient has medication.  vitamin B-12 (CYANOCOBALAMIN) 500 MCG tablet [id number] Yes Take 500 mcg by mouth in the morning. [provider] Past Week Active Self            Home Care and Equipment/Supplies: Were Home Health Services Ordered?: NA Any new equipment or medical supplies ordered?: NA  Functional Questionnaire: Do you need assistance with bathing/showering or dressing?: No Do you need assistance with meal preparation?: No Do you need assistance with eating?: No Do you have difficulty maintaining continence: No Do you need assistance with getting out of bed/getting  out of a chair/moving?: No Do you have difficulty managing or taking your medications?: No  Follow up appointments reviewed: PCP Follow-up appointment confirmed?: Yes Date of PCP follow-up appointment?: 08/16/22 Follow-up Provider: Dr. Alvis Lemmings Specialist [facility] Follow-up appointment confirmed?: Yes Date of Specialist follow-up appointment?: 08/09/22 Follow-Up Specialty Provider:: Dr. Lajoyce Corners Do you need transportation to your follow-up appointment?: No Do you understand care options if your condition(s) worsen?: Yes-patient verbalized understanding   TOC Interventions Today    Flowsheet Row Most Recent Value  TOC Interventions   TOC Interventions Discussed/Reviewed TOC Interventions Discussed, Post discharge activity limitations per provider, S/S of infection, Post op wound/incision care      Interventions Today    Flowsheet Row Most Recent Value  Chronic Disease   Chronic disease during today's  visit Diabetes  General Interventions   General Interventions Discussed/Reviewed General Interventions Discussed, Doctor Visits  Doctor Visits Discussed/Reviewed Specialist, Doctor Visits Discussed, PCP  PCP/Specialist Visits Compliance with follow-up visit  Education Interventions   Education Provided Provided Education  Provided Verbal Education On When to see the doctor, Medication, Other  [sx mgmt-pain mgmt, nausea mgmt, bowel regimen]  Nutrition Interventions   Nutrition Discussed/Reviewed Adding fruits and vegetables, Nutrition Discussed, Decreasing sugar intake, Decreasing salt  Pharmacy Interventions   Pharmacy Dicussed/Reviewed Pharmacy Topics Discussed, Medications and their functions  Safety Interventions   Safety Discussed/Reviewed Safety Discussed, Fall Risk, Home Safety  Home Safety Assistive Devices        [place], [place] [facility] Care Management Care Management Community Coordinator Direct Phone: [phone] Toll Free: [phone] Fax: [phone]

## 2022-08-05 NOTE — Telephone Encounter (Signed)
   Telephone encounter was:  Successful.  08/05/2022 Name: LATERRENCE BENNIGHT MRN: [id number] DOB: 1950-12-14  [name] is a 72 y.o. year old male who is a primary care patient of Hoy Register, MD . The community resource team was consulted for assistance with  Post discharge meals.  Care guide performed the following interventions: Spoke with Annice Pih at [employer]. 14 Meals will be delivered by [employer] on 08/10/22 vacuum sealed. Plan provides 1 week of meals. Contact number [phone].                                                                                                          Follow Up Plan:   I will call patient to update him to expect meals to be delivered on 08/10/22.  Corry Storie [facility]  [facility] Population Health Community Resource Care Guide   [email or web address]  ?? [phone]   Website: [email or web address]  [email or web address]

## 2022-08-05 NOTE — Telephone Encounter (Signed)
   Telephone encounter was:  Successful.  08/05/2022 Name: Caleb [name] MRN: [id number] DOB: 1951/02/10  Caleb [name] is a 72 y.o. year old male who is a primary care patient of Hoy Register, MD . The community resource team was consulted for assistance with  post discharge meal program.  Care guide performed the following interventions: Spoke with patient to update on delivery of [employer] meals on 08/10/22.  Follow Up Plan:  No further follow up planned at this time. The patient has been provided with needed resources.  Monalisa Bayless [facility]  [facility] Population Health Community Resource Care Guide   [email or web address]  ?? [phone]   Website: [email or web address]  [email or web address]

## 2022-08-05 NOTE — Telephone Encounter (Signed)
   Telephone encounter was:  Successful.  08/05/2022 Name: Caleb [name] MRN: [id number] DOB: March 07, 1951  Caleb [name] is a 72 y.o. year old male who is a primary care patient of Hoy Register, MD . The community resource team was consulted for assistance with  post discharge meals.  Care guide performed the following interventions: Spoke briefly with patient he was unable to talk and asked that I call him later.  Follow Up Plan:   I will follow-up later with patient as requested.  Caleb [name] [facility]  [facility] Population Health Community Resource Care Guide   [email or web address]  ?? [phone]   Website: [email or web address]  [email or web address]

## 2022-08-07 LAB — TYPE AND SCREEN
Antibody Screen: POSITIVE
DAT, IgG: NEGATIVE
Unit division: 0
Unit division: 0

## 2022-08-07 LAB — BPAM RBC
Blood Product Expiration Date: 202406152359
Unit Type and Rh: 5100

## 2022-08-08 ENCOUNTER — Telehealth: Payer: Self-pay | Admitting: Orthopedic Surgery

## 2022-08-08 ENCOUNTER — Ambulatory Visit: Payer: Self-pay

## 2022-08-08 ENCOUNTER — Other Ambulatory Visit: Payer: Self-pay | Admitting: Orthopedic Surgery

## 2022-08-08 ENCOUNTER — Telehealth: Payer: Self-pay

## 2022-08-08 MED ORDER — OXYCODONE-ACETAMINOPHEN 5-325 MG PO TABS: 1.0000 | ORAL_TABLET | ORAL | 0 refills | Status: DC | PRN

## 2022-08-08 NOTE — Telephone Encounter (Signed)
Pt informed percocet was sent in for him and to d/c the hydrocodone.

## 2022-08-08 NOTE — Telephone Encounter (Signed)
Pt is s/p right partial calcaneus excision on 07/29/22. Has a post op appt tomorrow. He states he has been having a lot of increasing pain especially at night. He is taking hydrocodone 5-325mg  every 4 hours on the clock. He says it is not touching his pain. He states he does elevate legs at night, but not a lot during the day. Advised he needs to during the date above heart level.  Please advise on pain medication

## 2022-08-08 NOTE — Telephone Encounter (Signed)
   Telephone encounter was:  Successful.  08/08/2022 Name: Caleb [name] MRN: [id number] DOB: [birth year]  [name] is a 72 y.o. year old male who is a primary care patient of Hoy Register, MD . The community resource team was consulted for assistance with  Post discharge meal program.  Care guide performed the following interventions: Spoke with patient to confirm email address to send contact information for [employer] Meal program [phone].  Follow Up Plan:  No further follow up planned at this time. The patient has been provided with needed resources.  Marili Vader [facility]  [facility] Population Health Community Resource Care Guide   [email or web address]  ?? [phone]   Website: [email or web address]  [email or web address]

## 2022-08-08 NOTE — Patient Outreach (Signed)
  Care Coordination   08/08/2022 Name: Caleb [name] MRN: [id number] DOB: Aug 03, 1950   Care Coordination Outreach Attempts:  An unsuccessful telephone outreach was attempted for a scheduled appointment today.  Follow Up Plan:  Additional outreach attempts will be made to offer the patient care coordination information and services.   Encounter Outcome:  No Answer   Care Coordination Interventions:  No, not indicated    Delsa Sale, RN, BSN, CCM Care Management Coordinator [facility] Care Management  Direct Phone: [phone]

## 2022-08-09 ENCOUNTER — Telehealth: Payer: Self-pay | Admitting: Family Medicine

## 2022-08-09 ENCOUNTER — Encounter: Payer: No Typology Code available for payment source | Admitting: Orthopedic Surgery

## 2022-08-09 ENCOUNTER — Telehealth: Payer: Self-pay | Admitting: Orthopedic Surgery

## 2022-08-09 ENCOUNTER — Telehealth: Payer: Self-pay

## 2022-08-09 DIAGNOSIS — E11621 Type 2 diabetes mellitus with foot ulcer: Secondary | ICD-10-CM | POA: Diagnosis not present

## 2022-08-09 DIAGNOSIS — M86171 Other acute osteomyelitis, right ankle and foot: Secondary | ICD-10-CM | POA: Diagnosis not present

## 2022-08-09 DIAGNOSIS — E1169 Type 2 diabetes mellitus with other specified complication: Secondary | ICD-10-CM | POA: Diagnosis not present

## 2022-08-09 DIAGNOSIS — B951 Streptococcus, group B, as the cause of diseases classified elsewhere: Secondary | ICD-10-CM | POA: Diagnosis not present

## 2022-08-09 DIAGNOSIS — A4189 Other specified sepsis: Secondary | ICD-10-CM | POA: Diagnosis not present

## 2022-08-09 DIAGNOSIS — I130 Hypertensive heart and chronic kidney disease with heart failure and stage 1 through stage 4 chronic kidney disease, or unspecified chronic kidney disease: Secondary | ICD-10-CM | POA: Diagnosis not present

## 2022-08-09 DIAGNOSIS — I5022 Chronic systolic (congestive) heart failure: Secondary | ICD-10-CM | POA: Diagnosis not present

## 2022-08-09 DIAGNOSIS — T8149XA Infection following a procedure, other surgical site, initial encounter: Secondary | ICD-10-CM | POA: Diagnosis not present

## 2022-08-09 DIAGNOSIS — L97419 Non-pressure chronic ulcer of right heel and midfoot with unspecified severity: Secondary | ICD-10-CM | POA: Diagnosis not present

## 2022-08-09 NOTE — Telephone Encounter (Signed)
Home Health Verbal Orders - Caller/Agency: Beth with [facility] Callback Number: [phone] Requesting : Nursing Frequency: 2x4 1x2   Beth states that pt started resumption of care today. Pt was discharged from the hospital on 08/04/22.   Pt had low blood pressure readings today: Right arm: 100/62 Left arm: 88/62 Pt is not having any signs of dizziness.   There is some discrepancies with pt medication: Pt was taking Spironolactone medication and that is not listed on his medication list. Pt does not have isosorbide mononitrate (IMDUR) 30 MG 24 hr tablet which was ordered to pt to take. Pt has been taking Synjardy and this medication is not on his list nor was it on the discharge medication list from the hospital.   Pt was suppose to have stopped taking Plavix medication and should have switched to Eliquis medication, but pt have been taking both medications for the past two days. Pt knows now to only take the Eliquis.   Per Waynetta Sandy pt might be back in the hospital dure to when she was changing his wound it is completely open.

## 2022-08-09 NOTE — Telephone Encounter (Signed)
Caleb [name] called about HH Cert and plan of care Order number [id number] needs to be signed dated and faxed over please advise they are refaxing documents

## 2022-08-09 NOTE — Telephone Encounter (Addendum)
Beth with [facility] Requesting : Nursing Frequency: 2x4 1x2   orders given  Advised medication discrepancies noted. Advised HH nurse to ensure [patient understands to bring in medication and DC summary for Hospital stay on 08/16/2022

## 2022-08-09 NOTE — Telephone Encounter (Signed)
Called and sw HHN and she states that the wound vac was removed when he returned to the to ER following his d/c. Pt cx appt today and resch for Thursday. Beth will remove the dressing today and appy a dry dressing and updated orders will be given at the time of that appt.

## 2022-08-09 NOTE — Telephone Encounter (Signed)
Beth with [facility] would like a call back concerning wound care orders for patient.  Stated that she is with the patient and would like to know if dressing needs to be changed or stay in place until appt.on Thursday.  Cb# [phone].  Please advise.

## 2022-08-10 ENCOUNTER — Ambulatory Visit: Payer: No Typology Code available for payment source | Admitting: Family

## 2022-08-10 ENCOUNTER — Encounter: Payer: Self-pay | Admitting: Family

## 2022-08-10 DIAGNOSIS — Z8739 Personal history of other diseases of the musculoskeletal system and connective tissue: Secondary | ICD-10-CM

## 2022-08-10 MED ORDER — SULFAMETHOXAZOLE-TRIMETHOPRIM 800-160 MG PO TABS: 1.0000 | ORAL_TABLET | Freq: Two times a day (BID) | ORAL | 0 refills | Status: DC
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
## 2022-08-11 ENCOUNTER — Encounter: Payer: No Typology Code available for payment source | Admitting: Orthopedic Surgery

## 2022-08-11 ENCOUNTER — Inpatient Hospital Stay (HOSPITAL_COMMUNITY)
Admission: EM | Admit: 2022-08-11 | Discharge: 2022-08-15 | DRG: 683 | Disposition: A | Discharge status: Home / Self Care | Payer: Medicare HMO | Attending: Internal Medicine | Admitting: Internal Medicine

## 2022-08-11 ENCOUNTER — Emergency Department (HOSPITAL_COMMUNITY): Payer: Medicare HMO

## 2022-08-11 ENCOUNTER — Encounter (HOSPITAL_COMMUNITY): Payer: Self-pay

## 2022-08-11 ENCOUNTER — Other Ambulatory Visit: Payer: Self-pay

## 2022-08-11 ENCOUNTER — Telehealth: Payer: Self-pay

## 2022-08-11 DIAGNOSIS — N183 Chronic kidney disease, stage 3 unspecified: Secondary | ICD-10-CM | POA: Diagnosis present

## 2022-08-11 DIAGNOSIS — T8743 Infection of amputation stump, right lower extremity: Secondary | ICD-10-CM | POA: Diagnosis present

## 2022-08-11 DIAGNOSIS — L089 Local infection of the skin and subcutaneous tissue, unspecified: Secondary | ICD-10-CM | POA: Diagnosis present

## 2022-08-11 DIAGNOSIS — R1110 Vomiting, unspecified: Secondary | ICD-10-CM | POA: Diagnosis not present

## 2022-08-11 DIAGNOSIS — Z993 Dependence on wheelchair: Secondary | ICD-10-CM

## 2022-08-11 DIAGNOSIS — M67471 Ganglion, right ankle and foot: Secondary | ICD-10-CM

## 2022-08-11 DIAGNOSIS — E871 Hypo-osmolality and hyponatremia: Secondary | ICD-10-CM | POA: Diagnosis not present

## 2022-08-11 DIAGNOSIS — Y835 Amputation of limb(s) as the cause of abnormal reaction of the patient, or of later complication, without mention of misadventure at the time of the procedure: Secondary | ICD-10-CM | POA: Diagnosis present

## 2022-08-11 DIAGNOSIS — E875 Hyperkalemia: Secondary | ICD-10-CM | POA: Diagnosis not present

## 2022-08-11 DIAGNOSIS — E1169 Type 2 diabetes mellitus with other specified complication: Secondary | ICD-10-CM | POA: Diagnosis present

## 2022-08-11 DIAGNOSIS — E7800 Pure hypercholesterolemia, unspecified: Secondary | ICD-10-CM | POA: Diagnosis present

## 2022-08-11 DIAGNOSIS — R112 Nausea with vomiting, unspecified: Secondary | ICD-10-CM | POA: Diagnosis present

## 2022-08-11 DIAGNOSIS — Z952 Presence of prosthetic heart valve: Secondary | ICD-10-CM

## 2022-08-11 DIAGNOSIS — I110 Hypertensive heart disease with heart failure: Secondary | ICD-10-CM

## 2022-08-11 DIAGNOSIS — N189 Chronic kidney disease, unspecified: Secondary | ICD-10-CM

## 2022-08-11 DIAGNOSIS — E11628 Type 2 diabetes mellitus with other skin complications: Secondary | ICD-10-CM | POA: Diagnosis present

## 2022-08-11 DIAGNOSIS — G253 Myoclonus: Secondary | ICD-10-CM | POA: Diagnosis not present

## 2022-08-11 DIAGNOSIS — M86171 Other acute osteomyelitis, right ankle and foot: Secondary | ICD-10-CM | POA: Diagnosis not present

## 2022-08-11 DIAGNOSIS — N179 Acute kidney failure, unspecified: Secondary | ICD-10-CM | POA: Diagnosis not present

## 2022-08-11 DIAGNOSIS — M86672 Other chronic osteomyelitis, left ankle and foot: Secondary | ICD-10-CM | POA: Diagnosis present

## 2022-08-11 DIAGNOSIS — Z885 Allergy status to narcotic agent status: Secondary | ICD-10-CM

## 2022-08-11 DIAGNOSIS — D631 Anemia in chronic kidney disease: Secondary | ICD-10-CM | POA: Diagnosis present

## 2022-08-11 DIAGNOSIS — N1831 Chronic kidney disease, stage 3a: Secondary | ICD-10-CM | POA: Diagnosis present

## 2022-08-11 DIAGNOSIS — E222 Syndrome of inappropriate secretion of antidiuretic hormone: Secondary | ICD-10-CM | POA: Diagnosis present

## 2022-08-11 DIAGNOSIS — I130 Hypertensive heart and chronic kidney disease with heart failure and stage 1 through stage 4 chronic kidney disease, or unspecified chronic kidney disease: Secondary | ICD-10-CM | POA: Diagnosis present

## 2022-08-11 DIAGNOSIS — E1151 Type 2 diabetes mellitus with diabetic peripheral angiopathy without gangrene: Secondary | ICD-10-CM | POA: Diagnosis present

## 2022-08-11 DIAGNOSIS — E785 Hyperlipidemia, unspecified: Secondary | ICD-10-CM | POA: Diagnosis present

## 2022-08-11 DIAGNOSIS — M8660 Other chronic osteomyelitis, unspecified site: Secondary | ICD-10-CM | POA: Diagnosis present

## 2022-08-11 DIAGNOSIS — T8149XA Infection following a procedure, other surgical site, initial encounter: Secondary | ICD-10-CM | POA: Diagnosis not present

## 2022-08-11 DIAGNOSIS — E11621 Type 2 diabetes mellitus with foot ulcer: Secondary | ICD-10-CM | POA: Diagnosis not present

## 2022-08-11 DIAGNOSIS — I959 Hypotension, unspecified: Secondary | ICD-10-CM | POA: Diagnosis not present

## 2022-08-11 DIAGNOSIS — A4189 Other specified sepsis: Secondary | ICD-10-CM | POA: Diagnosis not present

## 2022-08-11 DIAGNOSIS — Z7901 Long term (current) use of anticoagulants: Secondary | ICD-10-CM

## 2022-08-11 DIAGNOSIS — Z794 Long term (current) use of insulin: Secondary | ICD-10-CM

## 2022-08-11 DIAGNOSIS — R6889 Other general symptoms and signs: Secondary | ICD-10-CM | POA: Diagnosis not present

## 2022-08-11 DIAGNOSIS — E1122 Type 2 diabetes mellitus with diabetic chronic kidney disease: Secondary | ICD-10-CM | POA: Diagnosis present

## 2022-08-11 DIAGNOSIS — Z811 Family history of alcohol abuse and dependence: Secondary | ICD-10-CM

## 2022-08-11 DIAGNOSIS — R0602 Shortness of breath: Secondary | ICD-10-CM | POA: Diagnosis not present

## 2022-08-11 DIAGNOSIS — E860 Dehydration: Secondary | ICD-10-CM | POA: Diagnosis present

## 2022-08-11 DIAGNOSIS — I5022 Chronic systolic (congestive) heart failure: Secondary | ICD-10-CM | POA: Diagnosis present

## 2022-08-11 DIAGNOSIS — E119 Type 2 diabetes mellitus without complications: Secondary | ICD-10-CM

## 2022-08-11 DIAGNOSIS — Z79899 Other long term (current) drug therapy: Secondary | ICD-10-CM | POA: Diagnosis not present

## 2022-08-11 DIAGNOSIS — Z95828 Presence of other vascular implants and grafts: Secondary | ICD-10-CM

## 2022-08-11 DIAGNOSIS — Z8249 Family history of ischemic heart disease and other diseases of the circulatory system: Secondary | ICD-10-CM

## 2022-08-11 DIAGNOSIS — Z833 Family history of diabetes mellitus: Secondary | ICD-10-CM

## 2022-08-11 DIAGNOSIS — M869 Osteomyelitis, unspecified: Secondary | ICD-10-CM

## 2022-08-11 DIAGNOSIS — B951 Streptococcus, group B, as the cause of diseases classified elsewhere: Secondary | ICD-10-CM | POA: Diagnosis not present

## 2022-08-11 DIAGNOSIS — L97419 Non-pressure chronic ulcer of right heel and midfoot with unspecified severity: Secondary | ICD-10-CM | POA: Diagnosis not present

## 2022-08-11 DIAGNOSIS — Z83719 Family history of colon polyps, unspecified: Secondary | ICD-10-CM

## 2022-08-11 LAB — CBC WITH DIFFERENTIAL/PLATELET
Abs Immature Granulocytes: 0.05 10*3/uL (ref 0.00–0.07)
Basophils Absolute: 0 10*3/uL (ref 0.0–0.1)
Basophils Relative: 0 %
Eosinophils Absolute: 0 10*3/uL (ref 0.0–0.5)
Eosinophils Relative: 0 %
HCT: 25.8 % — ABNORMAL LOW (ref 39.0–52.0)
Hemoglobin: 8.4 g/dL — ABNORMAL LOW (ref 13.0–17.0)
Immature Granulocytes: 0 %
Lymphocytes Relative: 13 %
Lymphs Abs: 1.6 10*3/uL (ref 0.7–4.0)
MCH: 27.2 pg (ref 26.0–34.0)
MCHC: 32.6 g/dL (ref 30.0–36.0)
MCV: 83.5 fL (ref 80.0–100.0)
Monocytes Absolute: 1.1 10*3/uL — ABNORMAL HIGH (ref 0.1–1.0)
Monocytes Relative: 9 %
Neutro Abs: 9.5 10*3/uL — ABNORMAL HIGH (ref 1.7–7.7)
Neutrophils Relative %: 78 %
Platelets: 487 10*3/uL — ABNORMAL HIGH (ref 150–400)
RBC: 3.09 MIL/uL — ABNORMAL LOW (ref 4.22–5.81)
RDW: 15.9 % — ABNORMAL HIGH (ref 11.5–15.5)
WBC: 12.3 10*3/uL — ABNORMAL HIGH (ref 4.0–10.5)
nRBC: 0 % (ref 0.0–0.2)

## 2022-08-11 LAB — COMPREHENSIVE METABOLIC PANEL
ALT: 16 U/L (ref 0–44)
AST: 21 U/L (ref 15–41)
Albumin: 2.5 g/dL — ABNORMAL LOW (ref 3.5–5.0)
Alkaline Phosphatase: 81 U/L (ref 38–126)
Anion gap: 10 (ref 5–15)
BUN: 44 mg/dL — ABNORMAL HIGH (ref 8–23)
CO2: 25 mmol/L (ref 22–32)
Calcium: 8.2 mg/dL — ABNORMAL LOW (ref 8.9–10.3)
Chloride: 92 mmol/L — ABNORMAL LOW (ref 98–111)
Creatinine, Ser: 2.6 mg/dL — ABNORMAL HIGH (ref 0.61–1.24)
GFR, Estimated: 25 mL/min — ABNORMAL LOW (ref >60)
Glucose, Bld: 144 mg/dL — ABNORMAL HIGH (ref 70–99)
Potassium: 4.6 mmol/L (ref 3.5–5.1)
Sodium: 127 mmol/L — ABNORMAL LOW (ref 135–145)
Total Bilirubin: 0.6 mg/dL (ref 0.3–1.2)
Total Protein: 7.2 g/dL (ref 6.5–8.1)

## 2022-08-11 LAB — LACTIC ACID, PLASMA
Lactic Acid, Venous: 1.1 mmol/L (ref 0.5–1.9)
Lactic Acid, Venous: 1 mmol/L (ref 0.5–1.9)

## 2022-08-11 LAB — IRON AND TIBC
Iron: 14 ug/dL — ABNORMAL LOW (ref 45–182)
Saturation Ratios: 6 % — ABNORMAL LOW (ref 17.9–39.5)
TIBC: 256 ug/dL (ref 250–450)
UIBC: 242 ug/dL

## 2022-08-11 LAB — BASIC METABOLIC PANEL
Anion gap: 12 (ref 5–15)
BUN: 45 mg/dL — ABNORMAL HIGH (ref 8–23)
CO2: 24 mmol/L (ref 22–32)
Calcium: 8.9 mg/dL (ref 8.9–10.3)
Chloride: 91 mmol/L — ABNORMAL LOW (ref 98–111)
Creatinine, Ser: 2.58 mg/dL — ABNORMAL HIGH (ref 0.61–1.24)
GFR, Estimated: 26 mL/min — ABNORMAL LOW (ref >60)
Glucose, Bld: 145 mg/dL — ABNORMAL HIGH (ref 70–99)
Potassium: 5.1 mmol/L (ref 3.5–5.1)
Sodium: 127 mmol/L — ABNORMAL LOW (ref 135–145)

## 2022-08-11 LAB — CK: Total CK: 139 U/L (ref 49–397)

## 2022-08-11 LAB — BLOOD GAS, VENOUS
Acid-Base Excess: 3.1 mmol/L — ABNORMAL HIGH (ref 0.0–2.0)
Bicarbonate: 29.4 mmol/L — ABNORMAL HIGH (ref 20.0–28.0)
O2 Saturation: 34.9 %
Patient temperature: 37
pCO2, Ven: 52 mmHg (ref 44–60)
pH, Ven: 7.36 (ref 7.25–7.43)
pO2, Ven: <31 mmHg — CL (ref 32–45)

## 2022-08-11 LAB — RETICULOCYTES
Immature Retic Fract: 14.3 % (ref 2.3–15.9)
RBC.: 3.63 MIL/uL — ABNORMAL LOW (ref 4.22–5.81)
Retic Count, Absolute: 77.7 10*3/uL (ref 19.0–186.0)
Retic Ct Pct: 2.1 % (ref 0.4–3.1)

## 2022-08-11 LAB — CBG MONITORING, ED: Glucose-Capillary: 127 mg/dL — ABNORMAL HIGH (ref 70–99)

## 2022-08-11 LAB — FERRITIN: Ferritin: 350 ng/mL — ABNORMAL HIGH (ref 24–336)

## 2022-08-11 LAB — OSMOLALITY: Osmolality: 290 mOsm/kg (ref 275–295)

## 2022-08-11 LAB — MAGNESIUM: Magnesium: 2 mg/dL (ref 1.7–2.4)

## 2022-08-11 LAB — GLUCOSE, CAPILLARY: Glucose-Capillary: 173 mg/dL — ABNORMAL HIGH (ref 70–99)

## 2022-08-11 LAB — C-REACTIVE PROTEIN: CRP: 27.7 mg/dL — ABNORMAL HIGH (ref <1.0)

## 2022-08-11 LAB — PHOSPHORUS: Phosphorus: 4.2 mg/dL (ref 2.5–4.6)

## 2022-08-11 MED ORDER — INSULIN ASPART 100 UNIT/ML IJ SOLN
0.0000 [IU] | INTRAMUSCULAR | Status: DC
  Administered 2022-08-11: 1 [IU] via SUBCUTANEOUS
  Administered 2022-08-12: 2 [IU] via SUBCUTANEOUS
  Administered 2022-08-12: 1 [IU] via SUBCUTANEOUS
  Administered 2022-08-12: 2 [IU] via SUBCUTANEOUS
  Administered 2022-08-12: 1 [IU] via SUBCUTANEOUS
  Administered 2022-08-12: 2 [IU] via SUBCUTANEOUS
  Administered 2022-08-12: 1 [IU] via SUBCUTANEOUS
  Administered 2022-08-12 – 2022-08-13 (×4): 2 [IU] via SUBCUTANEOUS
  Administered 2022-08-13: 1 [IU] via SUBCUTANEOUS
  Administered 2022-08-14 (×2): 3 [IU] via SUBCUTANEOUS
  Administered 2022-08-14: 1 [IU] via SUBCUTANEOUS
  Administered 2022-08-14 – 2022-08-15 (×4): 2 [IU] via SUBCUTANEOUS

## 2022-08-11 MED ORDER — HYDROMORPHONE HCL 1 MG/ML IJ SOLN
1.0000 mg | Freq: Once | INTRAMUSCULAR | Status: AC
  Administered 2022-08-11: 1 mg via INTRAVENOUS
  Filled 2022-08-11: qty 1

## 2022-08-11 MED ORDER — FENTANYL CITRATE PF 50 MCG/ML IJ SOSY
50.0000 ug | PREFILLED_SYRINGE | Freq: Once | INTRAMUSCULAR | Status: AC
  Administered 2022-08-11: 50 ug via INTRAVENOUS
  Filled 2022-08-11: qty 1

## 2022-08-11 MED ORDER — LACTATED RINGERS IV BOLUS
500.0000 mL | Freq: Once | INTRAVENOUS | Status: AC
  Administered 2022-08-11: 500 mL via INTRAVENOUS

## 2022-08-11 MED ORDER — FENTANYL CITRATE PF 50 MCG/ML IJ SOSY
50.0000 ug | PREFILLED_SYRINGE | INTRAMUSCULAR | Status: DC | PRN
  Administered 2022-08-11 – 2022-08-12 (×5): 50 ug via INTRAVENOUS
  Filled 2022-08-11 (×5): qty 1

## 2022-08-11 MED ORDER — ONDANSETRON HCL 4 MG/2ML IJ SOLN
4.0000 mg | Freq: Once | INTRAMUSCULAR | Status: AC
  Administered 2022-08-11: 4 mg via INTRAVENOUS
  Filled 2022-08-11: qty 2

## 2022-08-11 MED ORDER — SODIUM CHLORIDE 0.9 % IV SOLN
3.0000 g | Freq: Two times a day (BID) | INTRAVENOUS | Status: DC
  Administered 2022-08-11 – 2022-08-13 (×4): 3 g via INTRAVENOUS
  Filled 2022-08-11 (×3): qty 8

## 2022-08-11 MED ORDER — LACTATED RINGERS IV BOLUS: 500.0000 mL | Freq: Once | INTRAVENOUS | Status: DC

## 2022-08-11 NOTE — ED Notes (Signed)
Wet to dry dsg applied to wound on Rt heel as per verbal order from Dr A. Doutova

## 2022-08-11 NOTE — Assessment & Plan Note (Signed)
Patient is followed by orthopedics Has been prescribed recently Bactrim which could have contributed to rising creatinine.  Will change to Unasyn after review of cultures. Check MRSA. Patient is thinking about possible second opinion regarding need for amputation .  He has been seen by orthopedics just yesterday wound was packed and they felt it looked good

## 2022-08-11 NOTE — ED Notes (Signed)
ED TO INPATIENT HANDOFF REPORT  ED Nurse Name and Phone #:  Gillis Ends # [id number]  S Name/Age/Gender Caleb [name] 72 y.o. male Room/Bed: 025C/025C  Code Status   Code Status: Prior  Home/SNF/Other Home Patient oriented to: self, place, time, and situation Is this baseline? Yes   Triage Complete: Triage complete  Chief Complaint AKI (acute kidney injury) [facility]) [N17.9]  Triage Note Pt present to ED from home with c/o hypotension noted by home health nurse and right toe pain. Pt noted bp 90/50 manually via EMS, increasing to  110/60. Pt A&Ox4 at this time. Pt states to taking both narcotic pain medications together.   Allergies Allergies  Allergen Reactions   Codeine Nausea And Vomiting   Percocet [Oxycodone-Acetaminophen] Nausea And Vomiting   Tramadol Hcl Nausea And Vomiting    Level of Care/Admitting Diagnosis ED Disposition     ED Disposition  Admit   Condition  --   Comment  Hospital Area: [facility] [[id number]]  Level of Care: Progressive [102]  Admit to Progressive based on following criteria: MULTISYSTEM THREATS such as stable sepsis, metabolic/electrolyte imbalance with or without encephalopathy that is responding to early treatment.  May place patient in observation at [facility] or [facility] if equivalent level of care is available:: No  Covid Evaluation: Asymptomatic - no recent exposure (last 10 days) testing not required  Diagnosis: AKI (acute kidney injury) [facility]) [[id number]]  Admitting Physician: Therisa Doyne [[id number]]  Attending Physician: Therisa Doyne [[id number]]          B Medical/Surgery History Past Medical History:  Diagnosis Date   Carotid artery occlusion    CHF (congestive heart failure) (HCC)    Diabetes mellitus without complication (HCC)    Type II   Dyslipidemia 09/27/2012   Erectile dysfunction 09/27/2012   GERD (gastroesophageal reflux disease)    Hypercholesteremia    Hyperlipidemia 08/12/2012    Hypertension    Hyponatremia 08/14/2012   MGUS (monoclonal gammopathy of unknown significance)    Neuropathy    Pancreatitis    Pancreatitis, acute 09/27/2012   Peripheral vascular disease (HCC)    S/P 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 Excell Seltzer & Bartle   Severe aortic stenosis    Vitamin D deficiency 06/23/2015   Past Surgical History:  Procedure Laterality Date   ABDOMINAL AORTOGRAM W/LOWER EXTREMITY N/A 08/08/2019   Procedure: ABDOMINAL AORTOGRAM W/LOWER EXTREMITY;  Surgeon: Runell Gess, MD;  [facility];  Service: Cardiovascular;  Laterality: N/A;   ABDOMINAL AORTOGRAM W/LOWER EXTREMITY N/A 11/18/2019   Procedure: ABDOMINAL AORTOGRAM W/LOWER EXTREMITY;  Surgeon: Runell Gess, MD;  [facility];  Service: Cardiovascular;  Laterality: N/A;   ABDOMINAL AORTOGRAM W/LOWER EXTREMITY N/A 06/13/2022   Procedure: ABDOMINAL AORTOGRAM W/LOWER EXTREMITY;  Surgeon: Chuck Hint, MD;  Location: [facility] INVASIVE CV LAB;  Service: Cardiovascular;  Laterality: N/A;   APPLICATION OF WOUND VAC  06/24/2022   Procedure: APPLICATION OF WOUND VAC;  Surgeon: Nadara Mustard, MD;  [facility];  Service: Orthopedics;;   CARDIAC CATHETERIZATION     COLONOSCOPY  12 years ago   in [place] clinic= normal exam per pt   ENDARTERECTOMY FEMORAL Right 06/17/2022   Procedure: SUPERFICIAL ENDARTERECTOMY OF COMMON FEMORAL ARTERY;  Surgeon: Victorino Sparrow, MD;  Location: [facility] OR;  Service: Vascular;  Laterality: Right;   FEMORAL-TIBIAL BYPASS GRAFT Right 06/17/2022  Procedure: RIGHT FEMORAL- ANTERIOR TIBIAL ARTERY BYPASS;  Surgeon: Victorino Sparrow, MD;  Location: [facility] OR;  Service: Vascular;  Laterality: Right;   I & D EXTREMITY Right 06/24/2022   Procedure: RIGHT PARTIAL CALCANEUS EXCISION;  Surgeon: Nadara Mustard, MD;  Location: [facility] OR;  Service: Orthopedics;  Laterality: Right;   I & D  EXTREMITY Right 07/29/2022   Procedure: RIGHT PARTIAL CALCANEUS EXCISION;  Surgeon: Nadara Mustard, MD;  Location: [facility] OR;  Service: Orthopedics;  Laterality: Right;   INTRAOPERATIVE TRANSTHORACIC ECHOCARDIOGRAM N/A 01/11/2022   Procedure: INTRAOPERATIVE TRANSTHORACIC ECHOCARDIOGRAM;  Surgeon: Tonny Bollman, MD;  Location: [facility] INVASIVE CV LAB;  Service: Open Heart Surgery;  Laterality: N/A;   KNEE SURGERY     MULTIPLE EXTRACTIONS WITH ALVEOLOPLASTY N/A 12/09/2021   Procedure: MULTIPLE EXTRACTION;  Surgeon: Sharman Cheek, DMD;  [facility];  Service: Dentistry;  Laterality: N/A;   PERIPHERAL VASCULAR INTERVENTION Right 08/08/2019   Procedure: PERIPHERAL VASCULAR INTERVENTION;  Surgeon: Runell Gess, MD;  [facility];  Service: Cardiovascular;  Laterality: Right;   PERIPHERAL VASCULAR INTERVENTION Left 11/18/2019   Procedure: PERIPHERAL VASCULAR INTERVENTION;  Surgeon: Runell Gess, MD;  [facility];  Service: Cardiovascular;  Laterality: Left;  left popliteal artery   RIGHT/LEFT HEART CATH AND CORONARY ANGIOGRAPHY N/A 10/29/2021   Procedure: RIGHT/LEFT HEART CATH AND CORONARY ANGIOGRAPHY;  Surgeon: Kathleene Hazel, MD;  [facility];  Service: Cardiovascular;  Laterality: N/A;   TRANSCAROTID ARTERY REVASCULARIZATION  Left 12/16/2019   Procedure: TRANSCAROTID ARTERY REVASCULARIZATION;  Surgeon: Sherren Kerns, MD;  Location: [facility] OR;  Service: Vascular;  Laterality: Left;   TRANSCAROTID ARTERY REVASCULARIZATION  Right 02/03/2020   Procedure: RIGHT TRANSCAROTID ARTERY REVASCULARIZATION;  Surgeon: Sherren Kerns, MD;  Location: [facility] OR;  Service: Vascular;  Laterality: Right;   TRANSCATHETER AORTIC VALVE REPLACEMENT, TRANSFEMORAL N/A 01/11/2022   Procedure: Transcatheter Aortic Valve Replacement, Transfemoral;  Surgeon: Tonny Bollman, MD;  Location: [facility] INVASIVE CV LAB;  Service: Open Heart Surgery;  Laterality: N/A;    ULTRASOUND GUIDANCE FOR VASCULAR ACCESS Right 12/16/2019   Procedure: ULTRASOUND GUIDANCE FOR VASCULAR ACCESS;  Surgeon: Sherren Kerns, MD;  Location: [facility] OR;  Service: Vascular;  Laterality: Right;   ULTRASOUND GUIDANCE FOR VASCULAR ACCESS Right 02/03/2020   Procedure: ULTRASOUND GUIDANCE FOR VASCULAR ACCESS;  Surgeon: Sherren Kerns, MD;  Location: [facility] OR;  Service: Vascular;  Laterality: Right;   VASECTOMY     VEIN HARVEST Right 06/17/2022   Procedure: IPSILATERAL RIGHT GREATER SAPHENOUS VEIN HARVEST;  Surgeon: Victorino Sparrow, MD;  Location: [facility] OR;  Service: Vascular;  Laterality: Right;     A IV Location/Drains/Wounds Patient Lines/Drains/Airways Status     Active Line/Drains/Airways     Name Placement date Placement time Site Days   Peripheral IV 08/11/22 20 G 1" Left Antecubital 08/11/22  1529  Antecubital  less than 1   Negative Pressure Wound Therapy Foot Right;Posterior 06/24/22  0910  --  48   Negative Pressure Wound Therapy Foot Right;Lateral 07/29/22  1200  --  13   Wound / Incision (Open or Dehisced) 05/27/22 Diabetic ulcer Heel Right 05/27/22  2104  Heel  76            Intake/Output Last 24 hours No intake or output data in the 24 hours ending 08/11/22 2001  Labs/Imaging Results for orders placed or performed during the hospital encounter of 08/11/22 (from the past 48 hour(s))  CBC  with Differential/Platelet     Status: Abnormal   Collection Time: 08/11/22  4:10 PM  Result Value Ref Range   WBC 12.3 (H) 4.0 - 10.5 K/uL   RBC 3.09 (L) 4.22 - 5.81 MIL/uL   Hemoglobin 8.4 (L) 13.0 - 17.0 g/dL   HCT 16.1 (L) 09.6 - 04.5 %   MCV 83.5 80.0 - 100.0 fL   MCH 27.2 26.0 - 34.0 pg   MCHC 32.6 30.0 - 36.0 g/dL   RDW 40.9 (H) 81.1 - 91.4 %   Platelets 487 (H) 150 - 400 K/uL   nRBC 0.0 0.0 - 0.2 %   Neutrophils Relative % 78 %   Neutro Abs 9.5 (H) 1.7 - 7.7 K/uL   Lymphocytes Relative 13 %   Lymphs Abs 1.6 0.7 - 4.0 K/uL   Monocytes Relative 9 %   Monocytes  Absolute 1.1 (H) 0.1 - 1.0 K/uL   Eosinophils Relative 0 %   Eosinophils Absolute 0.0 0.0 - 0.5 K/uL   Basophils Relative 0 %   Basophils Absolute 0.0 0.0 - 0.1 K/uL   Immature Granulocytes 0 %   Abs Immature Granulocytes 0.05 0.00 - 0.07 K/uL    Comment: Performed at [facility] Lab, [address], [place], [place] [address]  Comprehensive metabolic panel     Status: Abnormal   Collection Time: 08/11/22  4:10 PM  Result Value Ref Range   Sodium 127 (L) 135 - 145 mmol/L   Potassium 4.6 3.5 - 5.1 mmol/L   Chloride 92 (L) 98 - 111 mmol/L   CO2 25 22 - 32 mmol/L   Glucose, Bld 144 (H) 70 - 99 mg/dL    Comment: Glucose reference range applies only to samples taken after fasting for at least 8 hours.   BUN 44 (H) 8 - 23 mg/dL   Creatinine, Ser 6.21 (H) 0.61 - 1.24 mg/dL   Calcium 8.2 (L) 8.9 - 10.3 mg/dL   Total Protein 7.2 6.5 - 8.1 g/dL   Albumin 2.5 (L) 3.5 - 5.0 g/dL   AST 21 15 - 41 U/L   ALT 16 0 - 44 U/L   Alkaline Phosphatase 81 38 - 126 U/L   Total Bilirubin 0.6 0.3 - 1.2 mg/dL   GFR, Estimated 25 (L) >60 mL/min    Comment: (NOTE) Calculated using the CKD-EPI Creatinine Equation (2021)    Anion gap 10 5 - 15    Comment: Performed at [facility] Lab, [address], [place], [place] [address]  Lactic acid, plasma     Status: None   Collection Time: 08/11/22  4:10 PM  Result Value Ref Range   Lactic Acid, Venous 1.1 0.5 - 1.9 mmol/L    Comment: Performed at [facility] Lab, [address] [place], [place] [address]  Magnesium     Status: None   Collection Time: 08/11/22  4:10 PM  Result Value Ref Range   Magnesium 2.0 1.7 - 2.4 mg/dL    Comment: Performed at [facility] Lab, [address], [place], [place] [address]  Phosphorus     Status: None   Collection Time: 08/11/22  4:10 PM  Result Value Ref Range   Phosphorus 4.2 2.5 - 4.6 mg/dL    Comment: Performed at [facility] Lab, [address], [place], [place] [address]  Lactic acid, plasma      Status: None   Collection Time: 08/11/22  5:45 PM  Result Value Ref Range   Lactic  Acid, Venous 1.0 0.5 - 1.9 mmol/L    Comment: Performed at [facility], [address], [place], [place] [address]  CBG monitoring, ED     Status: Abnormal   Collection Time: 08/11/22  7:35 PM  Result Value Ref Range   Glucose-Capillary 127 (H) 70 - 99 mg/dL    Comment: Glucose reference range applies only to samples taken after fasting for at least 8 hours.   DG Abd Portable 1V  Result Date: 08/11/2022 CLINICAL DATA:  Vomiting EXAM: PORTABLE ABDOMEN - 1 VIEW COMPARISON:  CT abdomen and pelvis 08/03/2022 FINDINGS: The bowel gas pattern is normal. No radio-opaque calculi or other significant radiographic abnormality are seen. IMPRESSION: Negative. Electronically Signed   By: Minerva Fester M.D.   On: 08/11/2022 18:44    Pending Labs Unresulted Labs (From admission, onward)     Start     Ordered   08/12/22 0500  Prealbumin  Tomorrow morning,   R        08/11/22 1928   08/11/22 1929  C-reactive protein  Add-on,   AD        08/11/22 1928   08/11/22 1928  CK  Add-on,   AD        08/11/22 1928   08/11/22 1928  Osmolality, urine  Once,   R        08/11/22 1928   08/11/22 1928  Osmolality  Add-on,   AD        08/11/22 1928   08/11/22 1928  Creatinine, urine, random  Once,   R        08/11/22 1928   08/11/22 1928  TSH  Add-on,   AD        08/11/22 1928   08/11/22 1928  Urinalysis, Complete w Microscopic -Urine, Clean Catch  Once,   R       Question Answer Comment  Release to patient Immediate   Specimen Source Urine, Clean Catch      08/11/22 1928   08/11/22 1928  Sodium, urine, random  Once,   R        08/11/22 1928   08/11/22 1928  Vitamin B12  (Anemia Panel (PNL))  Once,   R        08/11/22 1928   08/11/22 1928  Folate  (Anemia Panel (PNL))  Once,   R        08/11/22 1928   08/11/22 1928  Iron and TIBC  (Anemia Panel (PNL))  Once,   R        08/11/22 1928   08/11/22 1928  Ferritin   (Anemia Panel (PNL))  Once,   R        08/11/22 1928   08/11/22 1928  Reticulocytes  (Anemia Panel (PNL))  Once,   R        08/11/22 1928   08/11/22 1928  Sedimentation rate  Add-on,   AD        08/11/22 1928            Vitals/Pain Today's Vitals   08/11/22 1800 08/11/22 1830 08/11/22 1900 08/11/22 1924  BP: 102/84 102/82 111/78   Pulse: 73 76 81   Resp: 15 18 (!) 21   Temp:    98.6 F (37 C)  TempSrc:      SpO2: (!) 88% 92% 100%   Weight:      Height:      PainSc:        Isolation Precautions  No active isolations  Medications Medications  insulin aspart (novoLOG) injection 0-9 Units (has no administration in time range)  fentaNYL (SUBLIMAZE) injection 50 mcg (50 mcg Intravenous Given 08/11/22 1608)  ondansetron (ZOFRAN) injection 4 mg (4 mg Intravenous Given 08/11/22 1609)  HYDROmorphone (DILAUDID) injection 1 mg (1 mg Intravenous Given 08/11/22 1641)  lactated ringers bolus 500 mL (0 mLs Intravenous Stopped 08/11/22 1909)    Mobility manual wheelchair     Focused Assessments     R Recommendations: See Admitting Provider Note  Report given to:  Additional Notes:

## 2022-08-11 NOTE — Assessment & Plan Note (Signed)
For right now continue with Unasyn hold Bactrim. Needed some point definitive treatment Would discuss in a.m. with ID antibiotic choice Please let Dr. Lajoyce Corners know the patient is coming in the hospital tomorrow

## 2022-08-11 NOTE — ED Triage Notes (Signed)
Pt present to ED from home with c/o hypotension noted by home health nurse and right toe pain. Pt noted bp 90/50 manually via EMS, increasing to  110/60. Pt A&Ox4 at this time. Pt states to taking both narcotic pain medications together.

## 2022-08-11 NOTE — Assessment & Plan Note (Signed)
Hold off on oxycodone pt does not tolerate well

## 2022-08-11 NOTE — Subjective & Objective (Signed)
Patient with multiple medical problems including diabetes hypertension hyperlipidemia peripheral vascular disease with known osteomyelitis has been seen in the orthopedics had an excision of his calcaneus done and was sent home on oxycodone for pain states oxycodone makes him fairly nauseous every time he takes it he has nausea vomiting and decreased p.o. intake or he could have is a low blood sugar yesterday.  At baseline patient has known history of CHF with EF of 25-30% and takes Entresto as well as Lasix.  He has been checking his blood pressure and has been somewhat low today his home health checked his blood pressure and it was in the mid 90s at which point they advised him to go to emergency department.  His wound has been recently checked by orthopedics it feels like there is no significant infection at this point.  He continues aggressive wound care.  He is scheduled to have BKA next week Otherwise no diarrhea no cough no shortness of breath Patient anxious states that he does not wish his foot to be amputated and wants to have second opinion. Patient is on Eliquis

## 2022-08-11 NOTE — Assessment & Plan Note (Signed)
Hold Entresto for tonight given AKI and dehydration and hypotension.  Gently rehydrate patient does appear to be very much dry.  Monitor fluid output if decompensates may need cardiology input

## 2022-08-11 NOTE — Assessment & Plan Note (Signed)
Recurrent.  Patient clearly appears to be dehydrated.  Obtain electrolytes and gently rehydrate and follow

## 2022-08-11 NOTE — H&P (Signed)
Caleb [name]:[id number] DOB: July 12, 1950 DOA: 08/11/2022    PCP: Hoy Register, MD   Outpatient Specialists:   CARDS: Dr. Nanetta Batty, MD   Orthopedics Dr. Lajoyce Corners  Patient arrived to ER on 08/11/22 at 1521 Referred by Attending Therisa Doyne, MD   Patient coming from:    home Lives alone,     Chief Complaint:   Chief Complaint  Patient presents with   Hypotension   Toe Pain    HPI: Caleb [name] is a 72 y.o. male with medical history significant of osteomyelitis of his right foot status post resection, PVD status post stents with chronic wound, DM2, HTN, HLD, systolic CHF EF 25-30, aortic stenosis status post TAVR 2023,    Presented with   nausea vomiting poor p.o. intake low blood pressures  Patient with multiple medical problems including diabetes hypertension hyperlipidemia peripheral vascular disease with known osteomyelitis has been seen in the orthopedics had an excision of his calcaneus done and was sent home on oxycodone for pain states oxycodone makes him fairly nauseous every time he takes it he has nausea vomiting and decreased p.o. intake or he could have is a low blood sugar yesterday.  At baseline patient has known history of CHF with EF of 25-30% and takes Entresto as well as Lasix.  He has been checking his blood pressure and has been somewhat low today his home health checked his blood pressure and it was in the mid 90s at which point they advised him to go to emergency department.  His wound has been recently checked by orthopedics it feels like there is no significant infection at this point.  He continues aggressive wound care.  He is scheduled to have BKA next week Otherwise no diarrhea no cough no shortness of breath Patient anxious states that he does not wish his foot to be amputated and wants to have second opinion. Patient is on Eliquis His Plavix wass topped on 5/16 and he is now on Eliquis only Has been taking Bactrim for his underlying  infection  Has nursing aid coming for the past few weeks He has been having some twitching episodes  Mainly upper ext No longer uses wound vac Denies significant ETOH intake   Does not smoke   Lab Results  Component Value Date   SARSCOV2NAA NEGATIVE 05/27/2022   SARSCOV2NAA NEGATIVE 01/07/2022   SARSCOV2NAA NEGATIVE 01/31/2020   SARSCOV2NAA NEGATIVE 12/14/2019    Regarding pertinent Chronic problems:     Hyperlipidemia - on statins Lipitor (atorvastatin)  Lipid Panel     Component Value Date/Time   CHOL 88 06/14/2022 0119   CHOL 106 06/05/2020 0947   TRIG 60 06/14/2022 0119   HDL 33 (L) 06/14/2022 0119   HDL 53 06/05/2020 0947   CHOLHDL 2.7 06/14/2022 0119   VLDL 12 06/14/2022 0119   LDLCALC 43 06/14/2022 0119   LDLCALC 29 06/05/2020 0947   LABVLDL 24 06/05/2020 0947     HTN on Lasix Entresto Imdur hydralazine Coreg   chronic CHF diastolic/systolic/ combined - last echo  Recent Results (from the past 29562 hour(s))  ECHOCARDIOGRAM COMPLETE   Collection Time: 05/28/22  5:48 PM  Result Value   Weight 2,959.46   Height 67   BP 119/81   Single Plane A4C EF 16.0   S' Lateral 4.40   AR max vel 1.02   AV Area VTI 1.03   AV Mean grad 8.5   AV Peak grad 16.9   Ao pk vel  2.06   Area-P 1/2 5.66   AV Area mean vel 1.02   MV VTI 1.42   Est EF 25 - 30%   Calc EF 26.8   Single Plane A2C EF 25.9   Narrative      ECHOCARDIOGRAM REPORT      IMPRESSIONS    1. Left ventricular ejection fraction, by estimation, is 25 to 30%. Left ventricular ejection fraction by 2D MOD biplane is 26.8 %. The left ventricle has severely decreased function. The left ventricle demonstrates global hypokinesis. There is mild  left ventricular hypertrophy. Left ventricular diastolic parameters are consistent with Grade II diastolic dysfunction (pseudonormalization). Elevated left ventricular end-diastolic pressure. The E/e' is 66. There is severe akinesis of the left  ventricular, mid-apical  anteroseptal wall, septal wall, anterior wall, inferoapical segment and anterolateral wall.  2. Right ventricular systolic function is mildly reduced. The right ventricular size is normal. There is moderately elevated pulmonary artery systolic pressure. The estimated right ventricular systolic pressure is 57.0 mmHg.  3. Left atrial size was mildly dilated.  4. The mitral valve is abnormal. Mild mitral valve regurgitation.  5. The aortic valve has been repaired/replaced. Aortic valve regurgitation is not visualized. There is a 26 mm Sapien prosthetic (TAVR) valve present in the aortic position. Procedure Date: 01/11/22. Aortic valve area, by VTI measures 1.03 cm. Aortic  valve mean gradient measures 8.5 mmHg. Aortic valve Vmax measures 2.05 m/s.  6. The inferior vena cava is dilated in size with <50% respiratory variability, suggesting right atrial pressure of 15 mmHg.            CAD  - On Aspirin, statin, betablocker,                  - followed by cardiology                Peripheral vascular disease -it seems patient is no longer on Plavix as per patient in the past ABI's completed with moderate PAD on the right LE arterial disease, mild on the left  Patient previously had undergone right femoral-anterior tibial artery bypass with superficial endarterectomy of the common femoral artery back in 05/2022 by Dr. Sherral Hammers.  Eliquis had been started following procedure. -he was on aspirin and plavix due to recently placed graft -until 5/16 when was switched to Eliquis     DM 2 -  Lab Results  Component Value Date   HGBA1C 7.8 (H) 07/29/2022   on insulin 70/30 8 units in Am 7 PM          CKD stage IIIb- baseline Cr 1.5 Estimated Creatinine Clearance: 26 mL/min (A) (by C-G formula based on SCr of 2.6 mg/dL (H)).  Lab Results  Component Value Date   CREATININE 2.60 (H) 08/11/2022   CREATININE 1.51 (H) 08/03/2022   CREATININE 1.69 (H) 08/02/2022       Chronic anemia - baseline hg  Hemoglobin & Hematocrit  Recent Labs    08/03/22 1036 08/03/22 1533 08/11/22 1610  HGB 7.8* 8.5* 8.4*    While in ER:         Lab Orders         CBC with Differential/Platelet         Comprehensive metabolic panel         Lactic acid, plasma         Magnesium         Phosphorus     KUB non acute    Following Medications were  ordered in ER: Medications  fentaNYL (SUBLIMAZE) injection 50 mcg (50 mcg Intravenous Given 08/11/22 1608)  ondansetron (ZOFRAN) injection 4 mg (4 mg Intravenous Given 08/11/22 1609)  HYDROmorphone (DILAUDID) injection 1 mg (1 mg Intravenous Given 08/11/22 1641)  lactated ringers bolus 500 mL (0 mLs Intravenous Stopped 08/11/22 1909)       ED Triage Vitals  Enc Vitals Group     BP 08/11/22 1526 114/67     Pulse Rate 08/11/22 1526 78     Resp 08/11/22 1526 18     Temp 08/11/22 1526 99.5 F (37.5 C)     Temp Source 08/11/22 1526 Oral     SpO2 08/11/22 1525 100 %     Weight 08/11/22 1527 176 lb 5.9 oz (80 kg)     Height 08/11/22 1527 5\' 7"  (1.702 m)     Head Circumference --      Peak Flow --      Pain Score 08/11/22 1526 10     Pain Loc --      Pain Edu? --      Excl. in GC? --   TMAX(24)@     _________________________________________ Significant initial  Findings: Abnormal Labs Reviewed  CBC WITH DIFFERENTIAL/PLATELET - Abnormal; Notable for the following components:      Result Value   WBC 12.3 (*)    RBC 3.09 (*)    Hemoglobin 8.4 (*)    HCT 25.8 (*)    RDW 15.9 (*)    Platelets 487 (*)    Neutro Abs 9.5 (*)    Monocytes Absolute 1.1 (*)    All other components within normal limits  COMPREHENSIVE METABOLIC PANEL - Abnormal; Notable for the following components:   Sodium 127 (*)    Chloride 92 (*)    Glucose, Bld 144 (*)    BUN 44 (*)    Creatinine, Ser 2.60 (*)    Calcium 8.2 (*)    Albumin 2.5 (*)    GFR, Estimated 25 (*)    All other components within normal limits       _____   ECG: Ordered Personally reviewed and  interpreted by me showing: HR : 80 Rhythm: Sinus rhythm Borderline repolarization abnormality QTC 465  BNP (last 3 results) Recent Labs    10/27/21 1707 03/07/22 1145 05/27/22 1513  BNP 1,165.9* 728.7* >4,500.0*     COVID-19 Labs  No results for input(s): "DDIMER", "FERRITIN", "LDH", "CRP" in the last 72 hours.  Lab Results  Component Value Date   SARSCOV2NAA NEGATIVE 05/27/2022   SARSCOV2NAA NEGATIVE 01/07/2022   SARSCOV2NAA NEGATIVE 01/31/2020   SARSCOV2NAA NEGATIVE 12/14/2019    The recent clinical data is shown below. Vitals:   08/11/22 1730 08/11/22 1800 08/11/22 1830 08/11/22 1900  BP: 98/66 102/84 102/82 111/78  Pulse: 73 73 76 81  Resp: 12 15 18  (!) 21  Temp:      TempSrc:      SpO2: 97% (!) 88% 92% 100%  Weight:      Height:        WBC     Component Value Date/Time   WBC 12.3 (H) 08/11/2022 1610   LYMPHSABS 1.6 08/11/2022 1610   LYMPHSABS 3.3 (H) 09/28/2017 1113   MONOABS 1.1 (H) 08/11/2022 1610   EOSABS 0.0 08/11/2022 1610   EOSABS 0.3 09/28/2017 1113   BASOSABS 0.0 08/11/2022 1610   BASOSABS 0.0 09/28/2017 1113     Lactic Acid, Venous    Component Value Date/Time   LATICACIDVEN  1.0 08/11/2022 1745      UA   ordered     Results for orders placed or performed during the hospital encounter of 06/07/22  Culture, blood (Routine X 2) w Reflex to ID Panel     Status: None   Collection Time: 06/08/22  8:28 AM   Specimen: BLOOD  Result Value Ref Range Status   Specimen Description   Final    BLOOD RIGHT ANTECUBITAL Performed at [facility], [address] [place], [place] [address]    Special Requests   Final    BOTTLES DRAWN AEROBIC ONLY Blood Culture adequate volume Performed at [facility], [address], [place], [place] [address]    Culture   Final    NO GROWTH 5 DAYS Performed at [facility], [address] [place], [place] [address]    Report Status 06/13/2022 FINAL  Final   Culture, blood (Routine X 2) w Reflex to ID Panel     Status: None   Collection Time: 06/08/22  8:28 AM   Specimen: BLOOD RIGHT HAND  Result Value Ref Range Status   Specimen Description   Final    BLOOD RIGHT HAND Performed at [facility], [address], [place], [place] [address]    Special Requests   Final    BOTTLES DRAWN AEROBIC ONLY Blood Culture adequate volume Performed at [facility], [address], [place], [place] [address]    Culture   Final    NO GROWTH 5 DAYS Performed at [facility], [address], [place], [place] [address]    Report Status 06/13/2022 FINAL  Final  Surgical pcr screen     Status: None   Collection Time: 06/16/22  9:23 PM   Specimen: Nasal Mucosa; Nasal Swab  Result Value Ref Range Status   MRSA, PCR NEGATIVE NEGATIVE Final   Staphylococcus aureus NEGATIVE NEGATIVE Final    Comment: (NOTE) The Xpert SA Assay (FDA approved for NASAL specimens in patients 5 years of age and older), is one component of a comprehensive surveillance program. It is not intended to diagnose infection nor to guide or monitor treatment. Performed at [facility], [address] [place], [place] [address]   Aerobic/Anaerobic Culture w Gram Stain (surgical/deep wound)     Status: None   Collection Time: 06/24/22  8:55 AM   Specimen: Soft Tissue, Other  Result Value Ref Range Status   Specimen Description TISSUE  Final   Special Requests  CALCANEUS  Final   Gram Stain NO WBC SEEN RARE GRAM POSITIVE COCCI   Final   Culture   Final    ABUNDANT GROUP B STREP(S.AGALACTIAE)ISOLATED TESTING AGAINST S. AGALACTIAE NOT ROUTINELY PERFORMED DUE TO PREDICTABILITY OF AMP/PEN/VAN SUSCEPTIBILITY. AMONG MIXED ORGANISMS NO ANAEROBES ISOLATED Performed at [facility], [address], [place], [place] [address]    Report Status 06/29/2022 FINAL  Final    ABX started Antibiotics Given (last 72 hours)     None        No results found for the last 90 days.    __________________________________________________________ Recent Labs  Lab 08/11/22 1610  NA 127*  K 4.6  CO2 25  GLUCOSE 144*  BUN 44*  CREATININE 2.60*  CALCIUM 8.2*  MG 2.0  PHOS 4.2    Cr Up from baseline see below Lab Results  Component Value Date   CREATININE 2.60 (H) 08/11/2022   CREATININE 1.51 (  H) 08/03/2022   CREATININE 1.69 (H) 08/02/2022    Recent Labs  Lab 08/11/22 1610  AST 21  ALT 16  ALKPHOS 81  BILITOT 0.6  PROT 7.2  ALBUMIN 2.5*   Lab Results  Component Value Date   CALCIUM 8.2 (L) 08/11/2022   PHOS 4.2 08/11/2022    Plt: Lab Results  Component Value Date   PLT 487 (H) 08/11/2022      Recent Labs  Lab 08/11/22 1610  WBC 12.3*  NEUTROABS 9.5*  HGB 8.4*  HCT 25.8*  MCV 83.5  PLT 487*    HG/HCT stable,      Component Value Date/Time   HGB 8.4 (L) 08/11/2022 1610   HGB 14.6 03/09/2022 1233   HGB 13.3 11/15/2019 1139   HCT 25.8 (L) 08/11/2022 1610   HCT 40.2 11/15/2019 1139   MCV 83.5 08/11/2022 1610   MCV 86 11/15/2019 1139    _______________________________________________ Hospitalist was called for admission for   AKI   Dehydration  Hyponatremia    The following Work up has been ordered so far:  Orders Placed This Encounter  Procedures   DG Abd Portable 1V   CBC with Differential/Platelet   Comprehensive metabolic panel   Lactic acid, plasma   Magnesium   Phosphorus   Diet heart healthy/carb modified Room service appropriate? Yes; Fluid consistency: Thin   Cardiac Monitoring - Continuous Indefinite   Consult to hospitalist   ED EKG   EKG 12-Lead   Place in observation (patient's expected length of stay will be less than 2 midnights)     OTHER Significant initial  Findings:  labs showing:     DM  labs:  HbA1C: Recent Labs    04/25/22 1106 07/25/22 1103 07/29/22 1423  HGBA1C 7.9* 7.5* 7.8*       CBG (last 3)  No results for input(s): "GLUCAP" in  the last 72 hours.        Cultures:    Component Value Date/Time   SDES TISSUE 06/24/2022 0855   SPECREQUEST  CALCANEUS 06/24/2022 0855   CULT  06/24/2022 0855    ABUNDANT GROUP B STREP(S.AGALACTIAE)ISOLATED TESTING AGAINST S. AGALACTIAE NOT ROUTINELY PERFORMED DUE TO PREDICTABILITY OF AMP/PEN/VAN SUSCEPTIBILITY. AMONG MIXED ORGANISMS NO ANAEROBES ISOLATED Performed at [facility], [address], [place], [place] [address]    REPTSTATUS 06/29/2022 FINAL 06/24/2022 0855     Radiological Exams on Admission: DG Abd Portable 1V  Result Date: 08/11/2022 CLINICAL DATA:  Vomiting EXAM: PORTABLE ABDOMEN - 1 VIEW COMPARISON:  CT abdomen and pelvis 08/03/2022 FINDINGS: The bowel gas pattern is normal. No radio-opaque calculi or other significant radiographic abnormality are seen. IMPRESSION: Negative. Electronically Signed   By: Minerva Fester M.D.   On: 08/11/2022 18:44   _______________________________________________________________________________________________________ Latest  Blood pressure 111/78, pulse 81, temperature 99.5 F (37.5 C), temperature source Oral, resp. rate (!) 21, height 5\' 7"  (1.702 m), weight 80 kg, SpO2 100 %.   Vitals  labs and radiology finding personally reviewed  Review of Systems:    Pertinent positives include:   fatigue, foot pain  Constitutional:  No weight loss, night sweats, Fevers, chills,weight loss  HEENT:  No headaches, Difficulty swallowing,Tooth/dental problems,Sore throat,  No sneezing, itching, ear ache, nasal congestion, post nasal drip,  Cardio-vascular:  No chest pain, Orthopnea, PND, anasarca, dizziness, palpitations.no Bilateral lower extremity swelling  GI:  No heartburn, indigestion, abdominal pain, nausea, vomiting, diarrhea, change in bowel habits, loss of appetite, melena, blood in stool, hematemesis  Resp:  no shortness of breath at rest. No dyspnea on exertion, No excess mucus, no productive cough, No non-productive  cough, No coughing up of blood.No change in color of mucus.No wheezing. Skin:  no rash or lesions. No jaundice GU:  no dysuria, change in color of urine, no urgency or frequency. No straining to urinate.  No flank pain.  Musculoskeletal:  No joint pain or no joint swelling. No decreased range of motion. No back pain.  Psych:  No change in mood or affect. No depression or anxiety. No memory loss.  Neuro: no localizing neurological complaints, no tingling, no weakness, no double vision, no gait abnormality, no slurred speech, no confusion  All systems reviewed and apart from HOPI all are negative _______________________________________________________________________________________________ Past Medical History:   Past Medical History:  Diagnosis Date   Carotid artery occlusion    CHF (congestive heart failure) (HCC)    Diabetes mellitus without complication (HCC)    Type II   Dyslipidemia 09/27/2012   Erectile dysfunction 09/27/2012   GERD (gastroesophageal reflux disease)    Hypercholesteremia    Hyperlipidemia 08/12/2012   Hypertension    Hyponatremia 08/14/2012   MGUS (monoclonal gammopathy of unknown significance)    Neuropathy    Pancreatitis    Pancreatitis, acute 09/27/2012   Peripheral vascular disease (HCC)    S/P 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 Excell Seltzer & Bartle   Severe aortic stenosis    Vitamin D deficiency 06/23/2015      Past Surgical History:  Procedure Laterality Date   ABDOMINAL AORTOGRAM W/LOWER EXTREMITY N/A 08/08/2019   Procedure: ABDOMINAL AORTOGRAM W/LOWER EXTREMITY;  Surgeon: Runell Gess, MD;  Location: [facility]VAS[facility] LAB;  Service: Cardiovascular;  Laterality: N/A;   ABDOMINAL AORTOGRAM W/LOWER EXTREMITY N/A 11/18/2019   Procedure: ABDOMINAL AORTOGRAM W/LOWER EXTREMITY;  Surgeon: Runell Gess, MD;  Location: [facility];  Service:  Cardiovascular;  Laterality: N/A;   ABDOMINAL AORTOGRAM W/LOWER EXTREMITY N/A 06/13/2022   Procedure: ABDOMINAL AORTOGRAM W/LOWER EXTREMITY;  Surgeon: Chuck Hint, MD;  Location: [facility] INVASIVE CV LAB;  Service: Cardiovascular;  Laterality: N/A;   APPLICATION OF WOUND VAC  06/24/2022   Procedure: APPLICATION OF WOUND VAC;  Surgeon: Nadara Mustard, MD;  Location: [facility] OR;  Service: Orthopedics;;   CARDIAC CATHETERIZATION     COLONOSCOPY  12 years ago   in [place] clinic= normal exam per pt   ENDARTERECTOMY FEMORAL Right 06/17/2022   Procedure: SUPERFICIAL ENDARTERECTOMY OF COMMON FEMORAL ARTERY;  Surgeon: Victorino Sparrow, MD;  Location: [facility] OR;  Service: Vascular;  Laterality: Right;   FEMORAL-TIBIAL BYPASS GRAFT Right 06/17/2022   Procedure: RIGHT FEMORAL- ANTERIOR TIBIAL ARTERY BYPASS;  Surgeon: Victorino Sparrow, MD;  Location: [facility] OR;  Service: Vascular;  Laterality: Right;   I & D EXTREMITY Right 06/24/2022   Procedure: RIGHT PARTIAL CALCANEUS EXCISION;  Surgeon: Nadara Mustard, MD;  Location: [facility] OR;  Service: Orthopedics;  Laterality: Right;   I & D EXTREMITY Right 07/29/2022   Procedure: RIGHT PARTIAL CALCANEUS EXCISION;  Surgeon: Nadara Mustard, MD;  Location: [facility] OR;  Service: Orthopedics;  Laterality: Right;   INTRAOPERATIVE TRANSTHORACIC ECHOCARDIOGRAM N/A 01/11/2022   Procedure: INTRAOPERATIVE TRANSTHORACIC ECHOCARDIOGRAM;  Surgeon: Tonny Bollman, MD;  Location: [facility] INVASIVE CV LAB;  Service: Open Heart Surgery;  Laterality: N/A;   KNEE SURGERY     MULTIPLE EXTRACTIONS WITH ALVEOLOPLASTY N/A 12/09/2021  Procedure: MULTIPLE EXTRACTION;  Surgeon: Sharman Cheek, DMD;  Location: [facility] OR;  Service: Dentistry;  Laterality: N/A;   PERIPHERAL VASCULAR INTERVENTION Right 08/08/2019   Procedure: PERIPHERAL VASCULAR INTERVENTION;  Surgeon: Runell Gess, MD;  [facility]: [facility];  Service: Cardiovascular;  Laterality: Right;   PERIPHERAL VASCULAR INTERVENTION Left 11/18/2019    Procedure: PERIPHERAL VASCULAR INTERVENTION;  Surgeon: Runell Gess, MD;  [facility]: [facility];  Service: Cardiovascular;  Laterality: Left;  left popliteal artery   RIGHT/LEFT HEART CATH AND CORONARY ANGIOGRAPHY N/A 10/29/2021   Procedure: RIGHT/LEFT HEART CATH AND CORONARY ANGIOGRAPHY;  Surgeon: Kathleene Hazel, MD;  Location: [facility];  Service: Cardiovascular;  Laterality: N/A;   TRANSCAROTID ARTERY REVASCULARIZATION  Left 12/16/2019   Procedure: TRANSCAROTID ARTERY REVASCULARIZATION;  Surgeon: Sherren Kerns, MD;  Location: [facility] OR;  Service: Vascular;  Laterality: Left;   TRANSCAROTID ARTERY REVASCULARIZATION  Right 02/03/2020   Procedure: RIGHT TRANSCAROTID ARTERY REVASCULARIZATION;  Surgeon: Sherren Kerns, MD;  Location: [facility] OR;  Service: Vascular;  Laterality: Right;   TRANSCATHETER AORTIC VALVE REPLACEMENT, TRANSFEMORAL N/A 01/11/2022   Procedure: Transcatheter Aortic Valve Replacement, Transfemoral;  Surgeon: Tonny Bollman, MD;  Location: [facility] INVASIVE CV LAB;  Service: Open Heart Surgery;  Laterality: N/A;   ULTRASOUND GUIDANCE FOR VASCULAR ACCESS Right 12/16/2019   Procedure: ULTRASOUND GUIDANCE FOR VASCULAR ACCESS;  Surgeon: Sherren Kerns, MD;  Location: [facility] OR;  Service: Vascular;  Laterality: Right;   ULTRASOUND GUIDANCE FOR VASCULAR ACCESS Right 02/03/2020   Procedure: ULTRASOUND GUIDANCE FOR VASCULAR ACCESS;  Surgeon: Sherren Kerns, MD;  Location: [facility] OR;  Service: Vascular;  Laterality: Right;   VASECTOMY     VEIN HARVEST Right 06/17/2022   Procedure: IPSILATERAL RIGHT GREATER SAPHENOUS VEIN HARVEST;  Surgeon: Victorino Sparrow, MD;  Location: [facility] OR;  Service: Vascular;  Laterality: Right;    Social History:  Ambulatory  wheelchair bound,       reports that he has never smoked. He has never been exposed to tobacco smoke. He has never used smokeless tobacco. He reports current alcohol use. He reports that he does not use drugs.    Family History:  Family History  Problem Relation Age of Onset   CAD Father    Hypertension Father    Alcohol abuse Father        Cause of death   Diabetes Mother    Colon polyps Mother    CAD Brother 53       CABG   Colon cancer Neg Hx    Esophageal cancer Neg Hx    Rectal cancer Neg Hx    Stomach cancer Neg Hx    ______________________________________________________________________________________________ Allergies: Allergies  Allergen Reactions   Codeine Nausea And Vomiting   Percocet [Oxycodone-Acetaminophen] Nausea And Vomiting   Tramadol Hcl Nausea And Vomiting     Prior to Admission medications   Medication Sig Start Date End Date Taking? Authorizing Provider  oxyCODONE-acetaminophen (PERCOCET/ROXICET) 5-325 MG tablet Take 1 tablet by mouth every 4 (four) hours as needed for severe pain. 08/08/22   Nadara Mustard, MD  apixaban (ELIQUIS) 5 MG TABS tablet Take 1 tablet (5 mg total) by mouth 2 (two) times daily. 08/04/22   Danford, Earl Lites, MD  ARTIFICIAL TEARS PF 0.1-0.3 % SOLN Place 1 drop into both eyes every 8 (eight) hours as needed (for dryness).    [provider]  aspirin EC 81 MG tablet Take 81 mg by mouth in  the morning.    [provider]  atorvastatin (LIPITOR) 40 MG tablet Take 40 mg by mouth in the morning. 07/03/20   [provider]  carvedilol (COREG) 12.5 MG tablet Take 1 tablet (12.5 mg total) by mouth 2 (two) times daily with a meal. 07/25/22 08/24/22  Hoy Register, MD  Cholecalciferol (VITAMIN D-3) 25 MCG (1000 UT) CAPS Take 1,000 Units by mouth daily.    [provider]  Continuous Blood Gluc Sensor (FREESTYLE LIBRE 14 DAY SENSOR) MISC Inject 1 Device into the skin every 14 (fourteen) days.    [provider]  ferrous sulfate 325 (65 FE) MG tablet Take 325 mg by mouth daily with breakfast.    [provider]  furosemide (LASIX) 40 MG tablet Take 1 tablet (40 mg total) by mouth daily at 4 PM. And 1  tablet (80 mg) daily in the morning 07/25/22   Hoy Register, MD  furosemide (LASIX) 80 MG tablet Take 1 tablet (80 mg total) by mouth in the morning. And 1 tablet (40 mg) in the evening 07/25/22   Hoy Register, MD  gabapentin (NEURONTIN) 800 MG tablet Take 800 mg by mouth 3 (three) times daily. 07/03/20   [provider]  hydrALAZINE (APRESOLINE) 10 MG tablet Take 1 tablet (10 mg total) by mouth 3 (three) times daily. 07/25/22 08/24/22  Hoy Register, MD  HYDROcodone-acetaminophen (NORCO/VICODIN) 5-325 MG tablet Take 1 tablet by mouth every 4 (four) hours as needed. 08/02/22   Nadara Mustard, MD  insulin NPH-regular Human (70-30) 100 UNIT/ML injection Inject 7-8 Units into the skin See admin instructions. Inject 8 units into the skin before breakfast and 7 units before the evening meal    [provider]  isosorbide mononitrate (IMDUR) 30 MG 24 hr tablet Take 1 tablet (30 mg total) by mouth daily. 01/13/22 01/13/23  Janetta Hora, PA-C  NON FORMULARY Take 16 oz by mouth See admin instructions. [employer] Signature Organic Raw Kombucha, Ginger Lemonade- Drink 16 ounces by mouth once a day    [provider]  ondansetron (ZOFRAN) 4 MG tablet Take 1 tablet (4 mg total) by mouth every 8 (eight) hours as needed for nausea or vomiting. 07/25/22   Hoy Register, MD  polyethylene glycol (MIRALAX / GLYCOLAX) 17 g packet Take 17 g by mouth daily. Patient taking differently: Take 17 g by mouth daily as needed for mild constipation. 06/02/22   Osvaldo Shipper, MD  Povidone-Iodine (BETADINE ANTISEPTIC EX) Apply 1 application  topically See admin instructions. Use as directed every other day for wound care (right heel site)    [provider]  PRESCRIPTION MEDICATION Inject 0.1-1 mLs as directed See admin instructions. Tri-Mix Standard Strength 5 ml. Formula: Prostaglandin 10 mcg/ml, Papaverine 30 mg/ml, Phentolamine 1 mg/ml. Inject 0.1 ml into side of penis as directed as needed  (to be injected immediately before sexual intercourse) may increase the dose by 0.1 ml every 48 hours to achieve an erection. Max dose 1 ml.    [provider]  sacubitril-valsartan (ENTRESTO) 49-51 MG Take 1 tablet by mouth 2 (two) times daily. 03/07/22   Sabharwal, Aditya, DO  sulfamethoxazole-trimethoprim (BACTRIM DS) 800-160 MG tablet Take 1 tablet by mouth 2 (two) times daily. 08/10/22   Adonis Huguenin, NP  vitamin B-12 (CYANOCOBALAMIN) 500 MCG tablet Take 500 mcg by mouth in the morning. 08/24/21   [provider]    ___________________________________________________________________________________________________ Physical Exam:    08/11/2022    7:00 PM 08/11/2022  6:30 PM 08/11/2022    6:00 PM  Vitals with BMI  Systolic 111 102 161  Diastolic 78 82 84  Pulse 81 76 73     1. General:  in  Acute distress complaining of severe pain    Chronically ill  -appearing 2. Psychological: Alert and   Oriented 3. Head/ENT:   Dry Mucous Membranes                          Head Non traumatic, neck supple                         Poor Dentition 4. SKIN:  decreased Skin turgor,  Skin clean Dry  large wound deep to the bone Of the right foot     5. Heart: Regular rate and rhythm no  Murmur, no Rub or gallop 6. Lungs:  , no wheezes or crackles   7. Abdomen: Soft,  non-tender, Non distended bowel sounds present 8. Lower extremities: no clubbing, cyanosis, no  edema 9. Neurologically Grossly intact, moving all 4 extremities equally  10. MSK: Normal range of motion    Chart has been reviewed  ______________________________________________________________________________________________  Assessment/Plan 72 y.o. male with medical history significant of osteomyelitis of his right foot status post resection, PVD status post stents with chronic wound, DM2, HTN, HLD, systolic CHF EF 25-30, aortic stenosis status post TAVR 2023,   Admitted for   AKI , Dehydration  Hyponatremia     Present on Admission:  AKI (acute kidney injury) (HCC)  Nausea and vomiting  Acute kidney injury superimposed on chronic kidney disease (HCC)  Chronic osteomyelitis of left foot (HCC)  Chronic systolic CHF (congestive heart failure) (HCC)  Stage III chronic kidney disease (HCC)  Hyponatremia  Dyslipidemia  Myoclonus  Osteomyelitis (HCC)     Nausea and vomiting Hold off on oxycodone pt does not tolerate well   Acute kidney injury superimposed on chronic kidney disease (HCC) In the setting of dehydration decreased p.o. intake as well as nausea Gently rehydrate hold Entresto and Lasix for tonight Of note patient has been on Bactrim which may elevate creatinine Will hold for right now If does not improve will benefit from combination of nephrology cardiology consult to further adjust medications   Chronic osteomyelitis of left foot (HCC) Patient is followed by orthopedics Has been prescribed recently Bactrim which could have contributed to rising creatinine.  Will change to Unasyn after review of cultures. Check MRSA. Patient is thinking about possible second opinion regarding need for amputation .  He has been seen by orthopedics just yesterday wound was packed and they felt it looked good  Chronic systolic CHF (congestive heart failure) (HCC) Hold Entresto for tonight given AKI and dehydration and hypotension.  Gently rehydrate patient does appear to be very much dry.  Monitor fluid output if decompensates may need cardiology input  Stage III chronic kidney disease (HCC)  -chronic avoid nephrotoxic medications such as NSAIDs, Vanco Zosyn combo,  avoid hypotension, continue to follow renal function   Hyponatremia Recurrent.  Patient clearly appears to be dehydrated.  Obtain electrolytes and gently rehydrate and follow  Dyslipidemia Chronic stable continue Lipitor 40 mg a day  Myoclonus Unclear etiology will check VBG If persists may need further neurological  evaluation  DM2 (diabetes mellitus, type 2) (HCC) Order sliding given worsening AKI hold NPH  Osteomyelitis (HCC) For right now continue with Unasyn hold Bactrim. Needed some point  definitive treatment Would discuss in a.m. with ID antibiotic choice Please let Dr. Lajoyce Corners know the patient is coming in the hospital tomorrow   Other plan as per orders.  DVT prophylaxis:  SCD      Code Status:    Code Status: Prior FULL CODE  as per patient   I had personally discussed CODE STATUS with patient   ACP none   Family Communication:   Family not at  Bedside    Diet  Diet Orders (From admission, onward)     Start     Ordered   08/11/22 1911  Diet heart healthy/carb modified Room service appropriate? Yes; Fluid consistency: Thin  Diet effective now       Question Answer Comment  Diet-HS Snack? Nothing   Room service appropriate? Yes   Fluid consistency: Thin      08/11/22 1910            Disposition Plan:     likely will need placement for rehabilitation                          Following barriers for discharge:                                                      Pain controlled with PO medications                                                         Will need consultants to evaluate patient prior to discharge        Consult Orders  (From admission, onward)           Start     Ordered   08/11/22 1738  Consult to hospitalist  Pg sent by deloris  Once       Provider:  (Not yet assigned)  Question Answer Comment  Place call to: [facility]   Reason for Consult Admit      08/11/22 1737                               Would benefit from PT/OT eval prior to DC  Ordered                      Consults called: sent msg to ID   Admission status:  ED Disposition     ED Disposition  Admit   Condition  --   Comment  Hospital Area: [facility] [[id number]]  Level of Care: Progressive [102]  Admit to Progressive based on following  criteria: MULTISYSTEM THREATS such as stable sepsis, metabolic/electrolyte imbalance with or without encephalopathy that is responding to early treatment.  May place patient in observation at [facility] or [facility] if equivalent level of care is available:: No  Covid Evaluation: Asymptomatic - no recent exposure (last 10 days) testing not required  Diagnosis: AKI (acute kidney injury) [facility] [[id number]]  Admitting Physician: Therisa Doyne [[id number]]  Attending Physician: Therisa Doyne [[id number]]         inpatient  I Expect 2 midnight stay secondary to severity of patient's current illness need for inpatient interventions justified by the following:  hemodynamic instability despite optimal treatment (hypotension  Severe lab/radiological/exam abnormalities including:    osteomyelitis and extensive comorbidities including:  Chronic wound DM2 Chronic anticoagulation  That are currently affecting medical management.   I expect  patient to be hospitalized for 2 midnights requiring inpatient medical care.  Patient is at high risk for adverse outcome (such as loss of life or disability) if not treated.  Indication for inpatient stay as follows:    Need for operative/procedural  intervention     Need for IV antibiotics, IV fluids, , IV pain medications,      Level of care          progressive tele indefinitely please discontinue once patient no longer qualifies COVID-19 Labs     Jermery Caratachea 08/11/2022, 9:09 PM    [facility]     after 2 AM please page floor coverage PA If 7AM-7PM, please contact the day team taking care of the patient using [email or web address]

## 2022-08-11 NOTE — Assessment & Plan Note (Addendum)
In the setting of dehydration decreased p.o. intake as well as nausea Gently rehydrate hold Entresto and Lasix for tonight Of note patient has been on Bactrim which may elevate creatinine Will hold for right now If does not improve will benefit from combination of nephrology cardiology consult to further adjust medications

## 2022-08-11 NOTE — Assessment & Plan Note (Signed)
Chronic stable continue Lipitor 40 mg a day 

## 2022-08-11 NOTE — Progress Notes (Signed)
Pharmacy Antibiotic Note  Caleb [name] is a 72 y.o. male admitted on 08/11/2022 with chronic wounds to right foot.  Pharmacy has been consulted for Unasyn dosing.  Plan: Unasyn 3g IV q 12h Monitor renal function, clinical progression and LOT  Height: 5\' 7"  (170.2 cm) Weight: 80 kg (176 lb 5.9 oz) IBW/kg (Calculated) : 66.1  Temp (24hrs), Avg:98.9 F (37.2 C), Min:98.5 F (36.9 C), Max:99.5 F (37.5 C)  Recent Labs  Lab 08/11/22 1610 08/11/22 1745  WBC 12.3*  --   CREATININE 2.60*  --   LATICACIDVEN 1.1 1.0    Estimated Creatinine Clearance: 26 mL/min (A) (by C-G formula based on SCr of 2.6 mg/dL (H)).    Allergies  Allergen Reactions   Codeine Nausea And Vomiting   Percocet [Oxycodone-Acetaminophen] Nausea And Vomiting   Tramadol Hcl Nausea And Vomiting    Daylene Posey, PharmD, [facility] Clinical Pharmacist ED Pharmacist Phone # [phone] 08/11/2022 10:51 PM

## 2022-08-11 NOTE — Assessment & Plan Note (Signed)
-  chronic avoid nephrotoxic medications such as NSAIDs, Vanco Zosyn combo,  avoid hypotension, continue to follow renal function  

## 2022-08-11 NOTE — ED Provider Notes (Signed)
[place] EMERGENCY DEPARTMENT AT [facility] Provider Note   CSN: [id number] Arrival date & time: 08/11/22  1521     History  Chief Complaint  Patient presents with   Hypotension   Toe Pain    Caleb [name] is a 72 y.o. male.  Patient is a 72 year old male with a history of diabetes, hypertension, hyperlipidemia, hyponatremia, PVD status post stents in the leg with chronic wound in the right foot with recurrent osteomyelitis of the calcaneus, aortic stenosis status post TAVR in 2023, CHF on Lasix and Entresto daily with last EF of 25 to 30% in March who recently had surgery 2 weeks ago where he had excision of a portion of his calcaneus who is currently in a boot and presenting today from home due to low blood pressure.  Patient reports that he felt okay this morning but he has had difficult time taking the oral Percocet and oxycodone because it makes him nauseated.  He has been able to eat some Cheerios but when the home health nurse came he was feeling nauseated and vomited once.  She checked his blood pressure during this event and it was in the upper 90s.  She did not want to leave him there by himself.  He denies any abdominal pain but reports he still having some nausea.  He has a lot of pain in his right foot which has been ongoing since the surgery.  He did follow-up with orthopedics yesterday and at that time he had mentioned he had had occasional nausea and vomiting but denied feeling bad.  He has not had any fever or chills.  He has not had any diarrhea, cough or shortness of breath.  They discussed limb salvage versus amputation.  Patient reports he is getting a second opinion because he does not want his foot to be amputated.  He denies any syncope today.  The history is provided by the patient and medical records.  Toe Pain       Home Medications Prior to Admission medications   Medication Sig Start Date End Date Taking? Authorizing Provider   oxyCODONE-acetaminophen (PERCOCET/ROXICET) 5-325 MG tablet Take 1 tablet by mouth every 4 (four) hours as needed for severe pain. 08/08/22   Nadara Mustard, MD  apixaban (ELIQUIS) 5 MG TABS tablet Take 1 tablet (5 mg total) by mouth 2 (two) times daily. 08/04/22   Danford, Earl Lites, MD  ARTIFICIAL TEARS PF 0.1-0.3 % SOLN Place 1 drop into both eyes every 8 (eight) hours as needed (for dryness).    [provider]  aspirin EC 81 MG tablet Take 81 mg by mouth in the morning.    [provider]  atorvastatin (LIPITOR) 40 MG tablet Take 40 mg by mouth in the morning. 07/03/20   [provider]  carvedilol (COREG) 12.5 MG tablet Take 1 tablet (12.5 mg total) by mouth 2 (two) times daily with a meal. 07/25/22 08/24/22  Hoy Register, MD  Cholecalciferol (VITAMIN D-3) 25 MCG (1000 UT) CAPS Take 1,000 Units by mouth daily.    [provider]  Continuous Blood Gluc Sensor (FREESTYLE LIBRE 14 DAY SENSOR) MISC Inject 1 Device into the skin every 14 (fourteen) days.    [provider]  ferrous sulfate 325 (65 FE) MG tablet Take 325 mg by mouth daily with breakfast.    [provider]  furosemide (LASIX) 40 MG tablet Take 1 tablet (40 mg total) by mouth daily at 4 PM. And 1  tablet (80 mg) daily in the morning 07/25/22   Hoy Register, MD  furosemide (LASIX) 80 MG tablet Take 1 tablet (80 mg total) by mouth in the morning. And 1 tablet (40 mg) in the evening 07/25/22   Hoy Register, MD  gabapentin (NEURONTIN) 800 MG tablet Take 800 mg by mouth 3 (three) times daily. 07/03/20   [provider]  hydrALAZINE (APRESOLINE) 10 MG tablet Take 1 tablet (10 mg total) by mouth 3 (three) times daily. 07/25/22 08/24/22  Hoy Register, MD  HYDROcodone-acetaminophen (NORCO/VICODIN) 5-325 MG tablet Take 1 tablet by mouth every 4 (four) hours as needed. 08/02/22   Nadara Mustard, MD  insulin NPH-regular Human (70-30) 100 UNIT/ML injection Inject 7-8 Units into the skin  See admin instructions. Inject 8 units into the skin before breakfast and 7 units before the evening meal    [provider]  isosorbide mononitrate (IMDUR) 30 MG 24 hr tablet Take 1 tablet (30 mg total) by mouth daily. 01/13/22 01/13/23  Janetta Hora, PA-C  NON FORMULARY Take 16 oz by mouth See admin instructions. [employer] Signature Organic Raw Kombucha, Ginger Lemonade- Drink 16 ounces by mouth once a day    [provider]  ondansetron (ZOFRAN) 4 MG tablet Take 1 tablet (4 mg total) by mouth every 8 (eight) hours as needed for nausea or vomiting. 07/25/22   Hoy Register, MD  polyethylene glycol (MIRALAX / GLYCOLAX) 17 g packet Take 17 g by mouth daily. Patient taking differently: Take 17 g by mouth daily as needed for mild constipation. 06/02/22   Osvaldo Shipper, MD  Povidone-Iodine (BETADINE ANTISEPTIC EX) Apply 1 application  topically See admin instructions. Use as directed every other day for wound care (right heel site)    [provider]  PRESCRIPTION MEDICATION Inject 0.1-1 mLs as directed See admin instructions. Tri-Mix Standard Strength 5 ml. Formula: Prostaglandin 10 mcg/ml, Papaverine 30 mg/ml, Phentolamine 1 mg/ml. Inject 0.1 ml into side of penis as directed as needed (to be injected immediately before sexual intercourse) may increase the dose by 0.1 ml every 48 hours to achieve an erection. Max dose 1 ml.    [provider]  sacubitril-valsartan (ENTRESTO) 49-51 MG Take 1 tablet by mouth 2 (two) times daily. 03/07/22   Sabharwal, Aditya, DO  sulfamethoxazole-trimethoprim (BACTRIM DS) 800-160 MG tablet Take 1 tablet by mouth 2 (two) times daily. 08/10/22   Adonis Huguenin, NP  vitamin B-12 (CYANOCOBALAMIN) 500 MCG tablet Take 500 mcg by mouth in the morning. 08/24/21   [provider]      Allergies    Codeine, Percocet [oxycodone-acetaminophen], and Tramadol hcl    Review of Systems   Review of Systems  Physical Exam Updated Vital  Signs BP 98/66   Pulse 73   Temp 99.5 F (37.5 C) (Oral)   Resp 12   Ht 5\' 7"  (1.702 m)   Wt 80 kg   SpO2 97%   BMI 27.62 kg/m  Physical Exam Vitals and nursing note reviewed.  Constitutional:      General: He is not in acute distress.    Appearance: He is well-developed.  HENT:     Head: Normocephalic and atraumatic.     Mouth/Throat:     Mouth: Mucous membranes are dry.  Eyes:     Conjunctiva/sclera: Conjunctivae normal.     Pupils: Pupils are equal, round, and reactive to light.  Cardiovascular:     Rate and Rhythm: Normal rate and regular rhythm.  Heart sounds: No murmur heard. Pulmonary:     Effort: Pulmonary effort is normal. No respiratory distress.     Breath sounds: Normal breath sounds. No wheezing or rales.  Abdominal:     General: There is no distension.     Palpations: Abdomen is soft.     Tenderness: There is no abdominal tenderness. There is no guarding or rebound.  Musculoskeletal:        General: No tenderness. Normal range of motion.     Cervical back: Normal range of motion and neck supple.     Comments: right foot with moderate edema without erythema.  The posterior heel with open ulceration this is 2 cm deep and exposed calcaneus serous drainage no erythema or warmth.  1+ DP pulse in the right foot  Skin:    General: Skin is warm and dry.     Coloration: Skin is pale.     Findings: No erythema or rash.  Neurological:     Mental Status: He is alert and oriented to person, place, and time.  Psychiatric:        Behavior: Behavior normal.     ED Results / Procedures / Treatments   Labs (all labs ordered are listed, but only abnormal results are displayed) Labs Reviewed  CBC WITH DIFFERENTIAL/PLATELET - Abnormal; Notable for the following components:      Result Value   WBC 12.3 (*)    RBC 3.09 (*)    Hemoglobin 8.4 (*)    HCT 25.8 (*)    RDW 15.9 (*)    Platelets 487 (*)    Neutro Abs 9.5 (*)    Monocytes Absolute 1.1 (*)    All other  components within normal limits  COMPREHENSIVE METABOLIC PANEL - Abnormal; Notable for the following components:   Sodium 127 (*)    Chloride 92 (*)    Glucose, Bld 144 (*)    BUN 44 (*)    Creatinine, Ser 2.60 (*)    Calcium 8.2 (*)    Albumin 2.5 (*)    GFR, Estimated 25 (*)    All other components within normal limits  LACTIC ACID, PLASMA  LACTIC ACID, PLASMA    EKG EKG Interpretation  Date/Time:  Thursday Aug 11 2022 16:29:27 EDT Ventricular Rate:  80 PR Interval:  183 QRS Duration: 102 QT Interval:  403 QTC Calculation: 465 R Axis:   76 Text Interpretation: Sinus rhythm Borderline repolarization abnormality No significant change since last tracing Confirmed by Gwyneth Sprout ([id number]) on 08/11/2022 4:47:58 PM  Radiology No results found.  Procedures Procedures    Medications Ordered in ED Medications  lactated ringers bolus 500 mL (has no administration in time range)  fentaNYL (SUBLIMAZE) injection 50 mcg (50 mcg Intravenous Given 08/11/22 1608)  ondansetron (ZOFRAN) injection 4 mg (4 mg Intravenous Given 08/11/22 1609)  HYDROmorphone (DILAUDID) injection 1 mg (1 mg Intravenous Given 08/11/22 1641)    ED Course/ Medical Decision Making/ A&P                             Medical Decision Making Amount and/or Complexity of Data Reviewed External Data Reviewed: notes. Labs: ordered. Decision-making details documented in ED Course. ECG/medicine tests: ordered and independent interpretation performed. Decision-making details documented in ED Course.  Risk Prescription drug management. Decision regarding hospitalization.   Pt with multiple medical problems and comorbidities and presenting today with a complaint that caries a high risk for morbidity  and mortality.  Here today with complaints of pain in his right heel where he recently had surgery and nausea and vomiting.  Patient has had intermittent nausea vomiting since surgery because he is having to take pain  medications that makes him nauseated.  He has been compliant with his medications.  During the event of emesis today the home health nurse checked his blood pressure and it was in the upper 90s.  Suspect there was a vasovagal component as patient has just vomited.  Here patient's blood pressure has been normal and was normal with EMS.  Currently 113/80.  Temperature was 99.5 he denies any fevers at home.  He is overall well-appearing but appears to be in pain.  Patient's capillary refill and pulses are intact in that foot.  Will ensure no AKI from nausea and vomiting he has had since going home or electrolyte abnormalities.  Low suspicion for sepsis today or acute cardiac cause.  Patient given pain control and will reassess after labs.  5:40 PM I independently interpreted patient's labs and CBC with mild leukocytosis of 12 today, hemoglobin stable at 8.4, CMP today with hyponatremia with sodium of 127, new AKI with creatinine of 2.6 from his baseline of 1.5 with normal anion gap and LFTs, lactic acid within normal limits.  I independently interpreted patient's EKG which shows no acute findings but chronic changes.  Given patient's nausea vomiting, new AKI suspect he has had more than he realized and most likely related to dehydration.  Denies any symptoms suggestive of UTI or pyelonephritis.  Low suspicion for obstruction.  Patient is also on 120 mg of Lasix daily and Entresto due to his poor EF.  Will gently hydrate but will admit for further care.  Findings discussed with the patient.  He is comfortable with this plan.  Consulted hospitalist for admission.         Final Clinical Impression(s) / ED Diagnoses Final diagnoses:  AKI (acute kidney injury) (HCC)  Dehydration  Hyponatremia    Rx / DC Orders ED Discharge Orders     None         Gwyneth Sprout, MD 08/11/22 1740

## 2022-08-11 NOTE — Assessment & Plan Note (Signed)
Unclear etiology will check VBG If persists may need further neurological evaluation

## 2022-08-11 NOTE — Assessment & Plan Note (Signed)
Order sliding given worsening AKI hold NPH

## 2022-08-11 NOTE — Telephone Encounter (Signed)
Checked the chart. He is currently at the ED. He is to be scheduled next week for a BKA.

## 2022-08-11 NOTE — Telephone Encounter (Signed)
Beth with [facility] called stating that patients BP was low, 78/50 and that he was shaky and not doing good and that she was going to call EMS.  Cb# [phone].  Please advise.  Thank you.

## 2022-08-12 DIAGNOSIS — Y835 Amputation of limb(s) as the cause of abnormal reaction of the patient, or of later complication, without mention of misadventure at the time of the procedure: Secondary | ICD-10-CM | POA: Diagnosis present

## 2022-08-12 DIAGNOSIS — N179 Acute kidney failure, unspecified: Secondary | ICD-10-CM | POA: Diagnosis not present

## 2022-08-12 DIAGNOSIS — I959 Hypotension, unspecified: Secondary | ICD-10-CM | POA: Diagnosis present

## 2022-08-12 DIAGNOSIS — R0602 Shortness of breath: Secondary | ICD-10-CM | POA: Diagnosis not present

## 2022-08-12 DIAGNOSIS — M67471 Ganglion, right ankle and foot: Secondary | ICD-10-CM

## 2022-08-12 DIAGNOSIS — E7800 Pure hypercholesterolemia, unspecified: Secondary | ICD-10-CM | POA: Diagnosis present

## 2022-08-12 DIAGNOSIS — T8131XA Disruption of external operation (surgical) wound, not elsewhere classified, initial encounter: Secondary | ICD-10-CM

## 2022-08-12 DIAGNOSIS — G253 Myoclonus: Secondary | ICD-10-CM | POA: Diagnosis present

## 2022-08-12 DIAGNOSIS — E860 Dehydration: Secondary | ICD-10-CM | POA: Diagnosis present

## 2022-08-12 DIAGNOSIS — E1151 Type 2 diabetes mellitus with diabetic peripheral angiopathy without gangrene: Secondary | ICD-10-CM | POA: Diagnosis present

## 2022-08-12 DIAGNOSIS — E875 Hyperkalemia: Secondary | ICD-10-CM | POA: Diagnosis not present

## 2022-08-12 DIAGNOSIS — E1169 Type 2 diabetes mellitus with other specified complication: Secondary | ICD-10-CM | POA: Diagnosis present

## 2022-08-12 DIAGNOSIS — M86672 Other chronic osteomyelitis, left ankle and foot: Secondary | ICD-10-CM | POA: Diagnosis present

## 2022-08-12 DIAGNOSIS — Z95828 Presence of other vascular implants and grafts: Secondary | ICD-10-CM | POA: Diagnosis not present

## 2022-08-12 DIAGNOSIS — I130 Hypertensive heart and chronic kidney disease with heart failure and stage 1 through stage 4 chronic kidney disease, or unspecified chronic kidney disease: Secondary | ICD-10-CM | POA: Diagnosis present

## 2022-08-12 DIAGNOSIS — Z7901 Long term (current) use of anticoagulants: Secondary | ICD-10-CM | POA: Diagnosis not present

## 2022-08-12 DIAGNOSIS — I5022 Chronic systolic (congestive) heart failure: Secondary | ICD-10-CM | POA: Diagnosis present

## 2022-08-12 DIAGNOSIS — E11628 Type 2 diabetes mellitus with other skin complications: Secondary | ICD-10-CM | POA: Diagnosis present

## 2022-08-12 DIAGNOSIS — T8743 Infection of amputation stump, right lower extremity: Secondary | ICD-10-CM | POA: Diagnosis present

## 2022-08-12 DIAGNOSIS — Z993 Dependence on wheelchair: Secondary | ICD-10-CM | POA: Diagnosis not present

## 2022-08-12 DIAGNOSIS — L089 Local infection of the skin and subcutaneous tissue, unspecified: Secondary | ICD-10-CM | POA: Diagnosis present

## 2022-08-12 DIAGNOSIS — Z79899 Other long term (current) drug therapy: Secondary | ICD-10-CM | POA: Diagnosis not present

## 2022-08-12 DIAGNOSIS — E1122 Type 2 diabetes mellitus with diabetic chronic kidney disease: Secondary | ICD-10-CM | POA: Diagnosis present

## 2022-08-12 DIAGNOSIS — N1831 Chronic kidney disease, stage 3a: Secondary | ICD-10-CM | POA: Diagnosis present

## 2022-08-12 DIAGNOSIS — D631 Anemia in chronic kidney disease: Secondary | ICD-10-CM | POA: Diagnosis present

## 2022-08-12 DIAGNOSIS — Z952 Presence of prosthetic heart valve: Secondary | ICD-10-CM | POA: Diagnosis not present

## 2022-08-12 DIAGNOSIS — E222 Syndrome of inappropriate secretion of antidiuretic hormone: Secondary | ICD-10-CM | POA: Diagnosis present

## 2022-08-12 LAB — COMPREHENSIVE METABOLIC PANEL
ALT: 19 U/L (ref 0–44)
AST: 27 U/L (ref 15–41)
Albumin: 2.5 g/dL — ABNORMAL LOW (ref 3.5–5.0)
Alkaline Phosphatase: 84 U/L (ref 38–126)
Anion gap: 15 (ref 5–15)
BUN: 44 mg/dL — ABNORMAL HIGH (ref 8–23)
CO2: 20 mmol/L — ABNORMAL LOW (ref 22–32)
Calcium: 8.5 mg/dL — ABNORMAL LOW (ref 8.9–10.3)
Chloride: 93 mmol/L — ABNORMAL LOW (ref 98–111)
Creatinine, Ser: 2.27 mg/dL — ABNORMAL HIGH (ref 0.61–1.24)
GFR, Estimated: 30 mL/min — ABNORMAL LOW (ref >60)
Glucose, Bld: 130 mg/dL — ABNORMAL HIGH (ref 70–99)
Potassium: 4.7 mmol/L (ref 3.5–5.1)
Sodium: 128 mmol/L — ABNORMAL LOW (ref 135–145)
Total Bilirubin: 0.5 mg/dL (ref 0.3–1.2)
Total Protein: 7.6 g/dL (ref 6.5–8.1)

## 2022-08-12 LAB — URIC ACID: Uric Acid, Serum: 8.9 mg/dL — ABNORMAL HIGH (ref 3.7–8.6)

## 2022-08-12 LAB — GLUCOSE, CAPILLARY
Glucose-Capillary: 128 mg/dL — ABNORMAL HIGH (ref 70–99)
Glucose-Capillary: 136 mg/dL — ABNORMAL HIGH (ref 70–99)
Glucose-Capillary: 137 mg/dL — ABNORMAL HIGH (ref 70–99)
Glucose-Capillary: 157 mg/dL — ABNORMAL HIGH (ref 70–99)
Glucose-Capillary: 164 mg/dL — ABNORMAL HIGH (ref 70–99)
Glucose-Capillary: 193 mg/dL — ABNORMAL HIGH (ref 70–99)

## 2022-08-12 LAB — VITAMIN B12: Vitamin B-12: 782 pg/mL (ref 180–914)

## 2022-08-12 LAB — CBC
HCT: 29 % — ABNORMAL LOW (ref 39.0–52.0)
Hemoglobin: 9.3 g/dL — ABNORMAL LOW (ref 13.0–17.0)
MCH: 26.2 pg (ref 26.0–34.0)
MCHC: 32.1 g/dL (ref 30.0–36.0)
MCV: 81.7 fL (ref 80.0–100.0)
Platelets: 496 10*3/uL — ABNORMAL HIGH (ref 150–400)
RBC: 3.55 MIL/uL — ABNORMAL LOW (ref 4.22–5.81)
RDW: 16 % — ABNORMAL HIGH (ref 11.5–15.5)
WBC: 10.6 10*3/uL — ABNORMAL HIGH (ref 4.0–10.5)
nRBC: 0 % (ref 0.0–0.2)

## 2022-08-12 LAB — FOLATE: Folate: 8.7 ng/mL (ref >5.9)

## 2022-08-12 LAB — URINALYSIS, COMPLETE (UACMP) WITH MICROSCOPIC
Bacteria, UA: NONE SEEN
Bilirubin Urine: NEGATIVE
Glucose, UA: >=500 mg/dL — AB
Hgb urine dipstick: NEGATIVE
Ketones, ur: NEGATIVE mg/dL
Leukocytes,Ua: NEGATIVE
Nitrite: NEGATIVE
Protein, ur: 30 mg/dL — AB
Specific Gravity, Urine: 1.013 (ref 1.005–1.030)
pH: 5 (ref 5.0–8.0)

## 2022-08-12 LAB — OSMOLALITY, URINE
Osmolality, Ur: 411 mOsm/kg (ref 300–900)
Osmolality, Ur: 421 mOsm/kg (ref 300–900)

## 2022-08-12 LAB — CREATININE, URINE, RANDOM
Creatinine, Urine: 87 mg/dL
Creatinine, Urine: 78 mg/dL

## 2022-08-12 LAB — PREALBUMIN: Prealbumin: 6 mg/dL — ABNORMAL LOW (ref 18–38)

## 2022-08-12 LAB — BRAIN NATRIURETIC PEPTIDE: B Natriuretic Peptide: 1137.8 pg/mL — ABNORMAL HIGH (ref 0.0–100.0)

## 2022-08-12 LAB — MAGNESIUM: Magnesium: 2.6 mg/dL — ABNORMAL HIGH (ref 1.7–2.4)

## 2022-08-12 LAB — SODIUM, URINE, RANDOM
Sodium, Ur: 36 mmol/L
Sodium, Ur: 38 mmol/L

## 2022-08-12 LAB — MRSA NEXT GEN BY PCR, NASAL: MRSA by PCR Next Gen: NOT DETECTED

## 2022-08-12 LAB — SEDIMENTATION RATE: Sed Rate: 140 mm/hr — ABNORMAL HIGH (ref 0–16)

## 2022-08-12 LAB — PHOSPHORUS: Phosphorus: 4.3 mg/dL (ref 2.5–4.6)

## 2022-08-12 LAB — TSH: TSH: 0.763 u[IU]/mL (ref 0.350–4.500)

## 2022-08-12 LAB — C-REACTIVE PROTEIN: CRP: 27.3 mg/dL — ABNORMAL HIGH (ref <1.0)

## 2022-08-12 LAB — PROCALCITONIN: Procalcitonin: 0.52 ng/mL

## 2022-08-12 LAB — OSMOLALITY: Osmolality: 288 mOsm/kg (ref 275–295)

## 2022-08-12 MED ORDER — HYDRALAZINE HCL 10 MG PO TABS
10.0000 mg | ORAL_TABLET | Freq: Three times a day (TID) | ORAL | Status: DC
  Administered 2022-08-12 – 2022-08-15 (×8): 10 mg via ORAL
  Filled 2022-08-12 (×9): qty 1

## 2022-08-12 MED ORDER — OXYCODONE-ACETAMINOPHEN 5-325 MG PO TABS
1.0000 | ORAL_TABLET | ORAL | Status: DC | PRN
  Filled 2022-08-12: qty 1

## 2022-08-12 MED ORDER — GABAPENTIN 300 MG PO CAPS
300.0000 mg | ORAL_CAPSULE | Freq: Two times a day (BID) | ORAL | Status: DC
  Administered 2022-08-13 – 2022-08-15 (×5): 300 mg via ORAL
  Filled 2022-08-12 (×5): qty 1

## 2022-08-12 MED ORDER — SODIUM CHLORIDE 0.9 % IV SOLN: INTRAVENOUS | Status: DC

## 2022-08-12 MED ORDER — GABAPENTIN 400 MG PO CAPS
800.0000 mg | ORAL_CAPSULE | Freq: Three times a day (TID) | ORAL | Status: DC
  Administered 2022-08-12: 800 mg via ORAL
  Filled 2022-08-12: qty 2

## 2022-08-12 MED ORDER — ACETAMINOPHEN 650 MG RE SUPP: 650.0000 mg | Freq: Four times a day (QID) | RECTAL | Status: DC | PRN

## 2022-08-12 MED ORDER — APIXABAN 5 MG PO TABS
5.0000 mg | ORAL_TABLET | Freq: Two times a day (BID) | ORAL | Status: DC
  Administered 2022-08-12 – 2022-08-15 (×8): 5 mg via ORAL
  Filled 2022-08-12 (×8): qty 1

## 2022-08-12 MED ORDER — ONDANSETRON HCL 4 MG PO TABS: 4.0000 mg | ORAL_TABLET | Freq: Four times a day (QID) | ORAL | Status: DC | PRN

## 2022-08-12 MED ORDER — ASPIRIN 81 MG PO TBEC
81.0000 mg | DELAYED_RELEASE_TABLET | Freq: Every morning | ORAL | Status: DC
  Administered 2022-08-12 – 2022-08-15 (×4): 81 mg via ORAL
  Filled 2022-08-12 (×4): qty 1

## 2022-08-12 MED ORDER — POLYETHYLENE GLYCOL 3350 17 G PO PACK: 17.0000 g | PACK | Freq: Every day | ORAL | Status: DC | PRN

## 2022-08-12 MED ORDER — FERROUS SULFATE 325 (65 FE) MG PO TABS
325.0000 mg | ORAL_TABLET | Freq: Every day | ORAL | Status: DC
  Administered 2022-08-12 – 2022-08-15 (×4): 325 mg via ORAL
  Filled 2022-08-12 (×4): qty 1

## 2022-08-12 MED ORDER — ATORVASTATIN CALCIUM 40 MG PO TABS
40.0000 mg | ORAL_TABLET | Freq: Every day | ORAL | Status: DC
  Administered 2022-08-12 – 2022-08-15 (×4): 40 mg via ORAL
  Filled 2022-08-12 (×4): qty 1

## 2022-08-12 MED ORDER — MORPHINE SULFATE 15 MG PO TABS
30.0000 mg | ORAL_TABLET | ORAL | Status: DC | PRN
  Administered 2022-08-12 (×2): 30 mg via ORAL
  Filled 2022-08-12: qty 2
  Filled 2022-08-12: qty 1
  Filled 2022-08-12: qty 2

## 2022-08-12 MED ORDER — ADULT MULTIVITAMIN W/MINERALS CH
1.0000 | ORAL_TABLET | Freq: Every day | ORAL | Status: DC
  Administered 2022-08-12 – 2022-08-15 (×4): 1 via ORAL
  Filled 2022-08-12 (×4): qty 1

## 2022-08-12 MED ORDER — VITAMIN B-12 1000 MCG PO TABS
500.0000 ug | ORAL_TABLET | Freq: Every morning | ORAL | Status: DC
  Administered 2022-08-12 – 2022-08-15 (×4): 500 ug via ORAL
  Filled 2022-08-12 (×4): qty 1

## 2022-08-12 MED ORDER — ENSURE ENLIVE PO LIQD
237.0000 mL | Freq: Two times a day (BID) | ORAL | Status: DC
  Administered 2022-08-13 – 2022-08-14 (×3): 237 mL via ORAL

## 2022-08-12 MED ORDER — ONDANSETRON HCL 4 MG/2ML IJ SOLN: 4.0000 mg | Freq: Four times a day (QID) | INTRAMUSCULAR | Status: DC | PRN

## 2022-08-12 MED ORDER — CARVEDILOL 6.25 MG PO TABS
6.2500 mg | ORAL_TABLET | Freq: Two times a day (BID) | ORAL | Status: DC
  Administered 2022-08-12 – 2022-08-14 (×4): 6.25 mg via ORAL
  Filled 2022-08-12 (×5): qty 1

## 2022-08-12 MED ORDER — ISOSORBIDE MONONITRATE ER 30 MG PO TB24
15.0000 mg | ORAL_TABLET | Freq: Every day | ORAL | Status: DC
  Administered 2022-08-12 – 2022-08-15 (×4): 15 mg via ORAL
  Filled 2022-08-12 (×4): qty 1

## 2022-08-12 MED ORDER — ACETAMINOPHEN 325 MG PO TABS
650.0000 mg | ORAL_TABLET | Freq: Four times a day (QID) | ORAL | Status: DC | PRN
  Administered 2022-08-13 – 2022-08-15 (×4): 650 mg via ORAL
  Filled 2022-08-12 (×4): qty 2

## 2022-08-12 NOTE — Progress Notes (Signed)
Initial Nutrition Assessment  DOCUMENTATION CODES:   Not applicable  INTERVENTION:  Multivitamin w/ minerals daily Ensure Enlive po BID, each supplement provides 350 kcal and 20 grams of protein. Encourage good PO intake   NUTRITION DIAGNOSIS:   Increased nutrient needs related to wound healing as evidenced by estimated needs.  GOAL:   Patient will meet greater than or equal to 90% of their needs  MONITOR:   PO intake, Supplement acceptance, Labs, I & O's  REASON FOR ASSESSMENT:   Consult Assessment of nutrition requirement/status  ASSESSMENT:   72 y.o. male presented to the ED with nausea, vomiting, poor PO intake, and low blood pressure. PMH includes CKD III, T2DM, HTN, HLD, CHF, PAD, and chronic osteomyelitis LLE. Pt admitted with AKI on CKD.   Pt in chair, woke to RD voice. Reports that he has been unable to tolerate anything at home except for cheerios with almond milk. States that he would throw everything else up and that this has been going on for about 1 months. Pt states he is unsure why. Reports that he was able to tolerate breakfast this morning and ate foods that he normally does not and did not throw up. Reports that he was having some constipation at home, discussed that is can be caused by a variety of things. Encouraged good fluid and fiber intake.  Reports a UBW of 178-180# and now is 161#, reports weight loss has occurred over the past month with unable to tolerate food. Per EMR, it appears that pt lost ~10 kg, but has gained 5 kg back.   Discussed the use of oral nutrition supplements for poor PO intake and increased needs for wound healing. Pt agreeable to Ensure. States that he thought about it at home but never started.   Reviewed the menu with the patient as well. Pt reports he is ok to call down and order meals.   Medications reviewed and include: Vitamin B12, Ferrous Sulfate, NovoLog SSI, IV antibiotics  Labs reviewed: Sodium 128, Potassium 4.7, BUN  44, Creatinine 2.27, Phosphorus 4.3, Magnesium 2.6, CRP 27.3, Hgb A1c 7.8 CBG: 128-164  NUTRITION - FOCUSED PHYSICAL EXAM: Deferred due to follow-up.   Diet Order:   Diet Order             Diet Carb Modified Fluid consistency: Thin; Room service appropriate? Yes  Diet effective now                   EDUCATION NEEDS:   Education needs have been addressed  Skin:  Skin Assessment: Reviewed RN Assessment  Last BM:  Unknown  Height:  Ht Readings from Last 1 Encounters:  08/11/22 5\' 7"  (1.702 m)   Weight:  Wt Readings from Last 1 Encounters:  08/11/22 80 kg   Ideal Body Weight:  67.3 kg  BMI:  Body mass index is 27.62 kg/m.  Estimated Nutritional Needs:  Kcal:  2200-2400 Protein:  110-130 grams Fluid:  >/= 2 L   Kirby Crigler RD, LDN Clinical Dietitian See [facility] for contact information.

## 2022-08-12 NOTE — Evaluation (Signed)
Physical Therapy Evaluation Patient Details Name: Caleb [name] MRN: [id number] DOB: 1950-07-29 Today's Date: 08/12/2022  History of Present Illness  72 y/o M admitted to [facility] on 5/23 for nausea and vomiting and hypotension, found to have dehydration, hyponatremia along with AKI. PMHx: osteomyelitis of his R foot s/p resection and due for outpatient BKA by Dr. Lajoyce Corners this coming week, PVD s/p stents with chronic wound, DM, HTN, HLD, systolic CHF EF 25-30, aortic stenosis s/p TAVR 2023.  Clinical Impression  Pt presents today with impaired functional mobility, limited by strength, pain, balance, activity tolerance, and sensation in RLE. Pt ambulates briefly in his home and utilizes his wheelchair for longer distances. Pt has plans for R BKA next week but reports he is pending a second opinion before proceeding with surgery. Educated pt on strengthening LLE prior to surgery to assist with the recovery process. Pt required minG-minA for today's session, ambulating briefly in the room with the RW, but pt remains limited by pain and fatigue. Pt will continue to benefit from skilled acute PT during admission to progress mobility and address deficits. Recommend HHPT upon discharge pending surgery date, but pt may benefit from the prehab aspect. Acute PT will continue to follow during admission as appropriate.        Recommendations for follow up therapy are one component of a multi-disciplinary discharge planning process, led by the attending physician.  Recommendations may be updated based on patient status, additional functional criteria and insurance authorization.  Follow Up Recommendations       Assistance Recommended at Discharge Frequent or constant Supervision/Assistance  Patient can return home with the following  A little help with walking and/or transfers;A little help with bathing/dressing/bathroom;Assistance with cooking/housework;Assist for transportation;Help with stairs or ramp for  entrance    Equipment Recommendations None recommended by PT  Recommendations for Other Services       Functional Status Assessment Patient has had a recent decline in their functional status and demonstrates the ability to make significant improvements in function in a reasonable and predictable amount of time.     Precautions / Restrictions Precautions Precautions: Fall Required Braces or Orthoses: Other Brace Other Brace: PRAFO R foot Restrictions Weight Bearing Restrictions: Yes RLE Weight Bearing: Non weight bearing Other Position/Activity Restrictions: Per chart review from last admission, PA note 5/16 may be minimal touchdown weightbearing on R foot, Dr. Lajoyce Corners states "minimize WB". Pt reports toe touch with PRAFO at home but often NWB due to pain      Mobility  Bed Mobility Overal bed mobility: Needs Assistance Bed Mobility: Supine to Sit     Supine to sit: Supervision, HOB elevated     General bed mobility comments: utilizing bed rails and mildly increased time    Transfers Overall transfer level: Needs assistance Equipment used: Rolling walker (2 wheels) Transfers: Sit to/from Stand Sit to Stand: Min assist           General transfer comment: minA for balance with power-up and for controlling descent when returning to sitting    Ambulation/Gait Ambulation/Gait assistance: Min guard Gait Distance (Feet): 5 Feet Assistive device: Rolling walker (2 wheels) Gait Pattern/deviations: Trunk flexed Gait velocity: decreased     General Gait Details: hopt to pattern, primarily NWB on RLE with brief toe touch method. increased trunk flexion. limited due to fatigue and pain in RLE  Stairs            Wheelchair Mobility    Modified Rankin (Stroke Patients Only)  Balance Overall balance assessment: Needs assistance Sitting-balance support: No upper extremity supported, Feet supported Sitting balance-Leahy Scale: Good     Standing balance  support: Bilateral upper extremity supported, During functional activity, Reliant on assistive device for balance Standing balance-Leahy Scale: Poor Standing balance comment: reliant on RW for balance                             Pertinent Vitals/Pain Pain Assessment Pain Assessment: Faces Faces Pain Scale: Hurts little more Pain Location: R foot with mobility Pain Descriptors / Indicators: Sore, Grimacing, Guarding Pain Intervention(s): Limited activity within patient's tolerance, Monitored during session, Repositioned    Home Living Family/patient expects to be discharged to:: Private residence Living Arrangements: Alone Available Help at Discharge: Family;Available PRN/intermittently Type of Home: House Home Access: Level entry       Home Layout: One level Home Equipment: Crutches;Cane - single point;Wheelchair - Forensic psychologist (2 wheels)      Prior Function Prior Level of Function : Independent/Modified Independent             Mobility Comments: using RW in his home, wheelchair in the community but cousin checks and delivers food ADLs Comments: independent     Hand Dominance   Dominant Hand: Left    Extremity/Trunk Assessment   Upper Extremity Assessment Upper Extremity Assessment: Defer to OT evaluation    Lower Extremity Assessment Lower Extremity Assessment: Generalized weakness;RLE deficits/detail RLE Sensation: history of peripheral neuropathy;decreased light touch    Cervical / Trunk Assessment Cervical / Trunk Assessment: Normal  Communication   Communication: No difficulties  Cognition Arousal/Alertness: Awake/alert Behavior During Therapy: WFL for tasks assessed/performed Overall Cognitive Status: Within Functional Limits for tasks assessed                                 General Comments: A&Ox4, pleasant throughout session        General Comments General comments (skin integrity, edema, etc.): VSS on room  air, PRAFO left donned and bandage on RLE    Exercises     Assessment/Plan    PT Assessment Patient needs continued PT services  PT Problem List Decreased mobility;Decreased knowledge of precautions;Decreased knowledge of use of DME;Pain;Impaired sensation;Decreased skin integrity;Decreased strength;Decreased activity tolerance;Decreased balance       PT Treatment Interventions DME instruction;Gait training;Functional mobility training;Therapeutic activities;Therapeutic exercise;Balance training;Patient/family education;Neuromuscular re-education    PT Goals (Current goals can be found in the Care Plan section)  Acute Rehab PT Goals Patient Stated Goal: get a second opinion on his surgery PT Goal Formulation: With patient Time For Goal Achievement: 08/26/22 Potential to Achieve Goals: Good    Frequency Min 3X/week     Co-evaluation               AM-PAC PT "6 Clicks" Mobility  Outcome Measure Help needed turning from your back to your side while in a flat bed without using bedrails?: A Little Help needed moving from lying on your back to sitting on the side of a flat bed without using bedrails?: A Little Help needed moving to and from a bed to a chair (including a wheelchair)?: A Little Help needed standing up from a chair using your arms (e.g., wheelchair or bedside chair)?: A Little Help needed to walk in hospital room?: A Lot Help needed climbing 3-5 steps with a railing? : A Lot 6 Click Score: 16  End of Session   Activity Tolerance: Patient tolerated treatment well Patient left: in chair;with call bell/phone within reach;with chair alarm set Nurse Communication: Mobility status PT Visit Diagnosis: Difficulty in walking, not elsewhere classified (R26.2);Other abnormalities of gait and mobility (R26.89);Pain Pain - Right/Left: Right Pain - part of body: Ankle and joints of foot    Time: 1610-9604 PT Time Calculation (min) (ACUTE ONLY): 22 min   Charges:    PT Evaluation $PT Eval Low Complexity: 1 Low          Lindalou Hose, PT DPT Acute Rehabilitation Services Office [phone]   Leonie Man 08/12/2022, 3:49 PM

## 2022-08-12 NOTE — Plan of Care (Signed)
  Problem: Education: Goal: Knowledge of the prescribed therapeutic regimen will improve Outcome: Progressing Goal: Ability to verbalize activity precautions or restrictions will improve Outcome: Progressing Goal: Understanding of discharge needs will improve Outcome: Progressing   Problem: Activity: Goal: Ability to perform//tolerate increased activity and mobilize with assistive devices will improve Outcome: Progressing   Problem: Clinical Measurements: Goal: Postoperative complications will be avoided or minimized Outcome: Progressing   Problem: Self-Care: Goal: Ability to meet self-care needs will improve Outcome: Progressing   Problem: Self-Concept: Goal: Ability to maintain and perform role responsibilities to the fullest extent possible will improve Outcome: Progressing   Problem: Pain Management: Goal: Pain level will decrease with appropriate interventions Outcome: Progressing   Problem: Skin Integrity: Goal: Demonstration of wound healing without infection will improve Outcome: Progressing   Problem: Education: Goal: Ability to describe self-care measures that may prevent or decrease complications (Diabetes Survival Skills Education) will improve Outcome: Progressing Goal: Individualized Educational Video(s) Outcome: Progressing   Problem: Coping: Goal: Ability to adjust to condition or change in health will improve Outcome: Progressing   Problem: Fluid Volume: Goal: Ability to maintain a balanced intake and output will improve Outcome: Progressing   Problem: Health Behavior/Discharge Planning: Goal: Ability to identify and utilize available resources and services will improve Outcome: Progressing Goal: Ability to manage health-related needs will improve Outcome: Progressing   Problem: Metabolic: Goal: Ability to maintain appropriate glucose levels will improve Outcome: Progressing   Problem: Nutritional: Goal: Maintenance of adequate nutrition will  improve Outcome: Progressing Goal: Progress toward achieving an optimal weight will improve Outcome: Progressing   Problem: Skin Integrity: Goal: Risk for impaired skin integrity will decrease Outcome: Progressing   Problem: Tissue Perfusion: Goal: Adequacy of tissue perfusion will improve Outcome: Progressing   Problem: Education: Goal: Knowledge of General Education information will improve Description: Including pain rating scale, medication(s)/side effects and non-pharmacologic comfort measures Outcome: Progressing   Problem: Health Behavior/Discharge Planning: Goal: Ability to manage health-related needs will improve Outcome: Progressing   Problem: Clinical Measurements: Goal: Ability to maintain clinical measurements within normal limits will improve Outcome: Progressing Goal: Will remain free from infection Outcome: Progressing Goal: Diagnostic test results will improve Outcome: Progressing Goal: Respiratory complications will improve Outcome: Progressing Goal: Cardiovascular complication will be avoided Outcome: Progressing   Problem: Activity: Goal: Risk for activity intolerance will decrease Outcome: Progressing   Problem: Nutrition: Goal: Adequate nutrition will be maintained Outcome: Progressing   Problem: Coping: Goal: Level of anxiety will decrease Outcome: Progressing   Problem: Elimination: Goal: Will not experience complications related to bowel motility Outcome: Progressing Goal: Will not experience complications related to urinary retention Outcome: Progressing   Problem: Pain Managment: Goal: General experience of comfort will improve Outcome: Progressing   Problem: Safety: Goal: Ability to remain free from injury will improve Outcome: Progressing   Problem: Skin Integrity: Goal: Risk for impaired skin integrity will decrease Outcome: Progressing   

## 2022-08-12 NOTE — Consult Note (Addendum)
WOC Nurse Consult Note: Reason for Consult: Consult requested for right foot wound.  Performed remotely after review of progress notes and photo in the EMR.  Pt is followed by Dr Lajoyce Corners of the ortho service as an outpatient and was recently seen in the office on 5/22 for wound assessment.  He plans to have a BKA next week.  Moist gauze dressings have been ordered prior to admission. Right posterior foot with full thickness chronic wound, dark inner wound with some slough to outer edges, exposed bone visible. Continue present plan of care. Topical treatment orders provided for bedside nurses to perform as follows: Pack right foot with moist gauze Q day, using swab to fill, then cover with ABD pad and kerlex. Please refer to Dr Lajoyce Corners for further plan of care regarding surgery. Please re-consult if further assistance is needed.  Thank-you,  Cammie Mcgee MSN, RN, CWOCN, [place], CNS [phone]

## 2022-08-12 NOTE — Evaluation (Signed)
Occupational Therapy Evaluation Patient Details Name: Caleb [name] MRN: [id number] DOB: September 29, 1950 Today's Date: 08/12/2022   History of Present Illness 72 y/o M admitted to [facility] on 5/23 for nausea and vomiting and hypotension, found to have dehydration, hyponatremia along with AKI. PMHx: osteomyelitis of his R foot s/p resection and due for outpatient BKA by Dr. Lajoyce Corners this coming week, PVD s/p stents with chronic wound, DM, HTN, HLD, systolic CHF EF 25-30, aortic stenosis s/p TAVR 2023.   Clinical Impression   At baseline, pt is Independent with ADLs and Mod I with functional mobility using a RW in the home and wheelchair in the community. Pt receives assistance for meal prep from family. Pt presents with decreased activity tolerance, decreased balance during functional tasks, decreased Bilateral fine motor coordination, decreased Bilateral shoulder strength, and decreased safety and independence with ADLs and functional transfers/mobility. Pt currently compltes UB Adls Mod I to Set up, LB ADLs with Min guard to Min assist, and functional mobilities transfers with RW while following R non weight bearing precautions with Min assist. Pt will benefit from acute skilled OT services to address deficits outlined below and to increase safety and independence with ADLs and functional mobility/transfers. Post acute discharge, pt will benefit from skilled OT services in the home. However, pt may benefit from a delay in start of HHOT until after pt decides regarding and/or undergoes BKA.      Recommendations for follow up therapy are one component of a multi-disciplinary discharge planning process, led by the attending physician.  Recommendations may be updated based on patient status, additional functional criteria and insurance authorization.   Assistance Recommended at Discharge Intermittent Supervision/Assistance  Patient can return home with the following A little help with walking and/or transfers;A little  help with bathing/dressing/bathroom;Assistance with cooking/housework;Assist for transportation;Help with stairs or ramp for entrance    Functional Status Assessment  Patient has had a recent decline in their functional status and demonstrates the ability to make significant improvements in function in a reasonable and predictable amount of time.  Equipment Recommendations  Other (comment) (Defer until pt has decided about and/or undergone BKA.)    Recommendations for Other Services       Precautions / Restrictions Precautions Precautions: Fall Required Braces or Orthoses: Other Brace Other Brace: PRAFO R foot Restrictions Weight Bearing Restrictions: Yes RLE Weight Bearing: Non weight bearing Other Position/Activity Restrictions: Per chart review from last admission, PA note 5/16 may be minimal touchdown weightbearing on R foot, Dr. Lajoyce Corners states "minimize WB". Pt reports toe touch with PRAFO at home but often NWB due to pain      Mobility Bed Mobility Overal bed mobility: Needs Assistance Bed Mobility: Supine to Sit     Supine to sit: Supervision, HOB elevated     General bed mobility comments: utilizing bed rails and mildly increased time    Transfers Overall transfer level: Needs assistance Equipment used: Rolling walker (2 wheels) Transfers: Sit to/from Stand, Bed to chair/wheelchair/BSC Sit to Stand: Min assist Stand pivot transfers: Min assist (with RW)         General transfer comment: minA for balance with power-up and for controlling descent when returning to sitting      Balance Overall balance assessment: Needs assistance Sitting-balance support: No upper extremity supported, Feet supported Sitting balance-Leahy Scale: Good     Standing balance support: Bilateral upper extremity supported, During functional activity, Reliant on assistive device for balance Standing balance-Leahy Scale: Poor Standing balance comment: reliant on RW  for balance                            ADL either performed or assessed with clinical judgement   ADL Overall ADL's : Needs assistance/impaired Eating/Feeding: Modified independent;Sitting   Grooming: Set up;Sitting   Upper Body Bathing: Supervision/ safety;Sitting   Lower Body Bathing: Min guard;Sitting/lateral leans   Upper Body Dressing : Set up;Sitting   Lower Body Dressing: Min guard;Sitting/lateral leans   Toilet Transfer: Minimal assistance;Stand-pivot;Rolling walker (2 wheels);BSC/3in1   Toileting- Clothing Manipulation and Hygiene: Min guard;Sitting/lateral lean       Functional mobility during ADLs: Minimal assistance;Rolling walker (2 wheels) (Primarily R LE NWB using occasional R toe touch for balance. Limited due to pain, fatigue, and R LE weight bearing precautions.)       Vision Baseline Vision/History: 1 Wears glasses Ability to See in Adequate Light: 0 Adequate Patient Visual Report: No change from baseline Vision Assessment?: No apparent visual deficits (with glasses)     Perception     Praxis Praxis Praxis tested?: Within functional limits    Pertinent Vitals/Pain Pain Assessment Pain Assessment: Faces Faces Pain Scale: Hurts little more Pain Location: R foot with mobility Pain Descriptors / Indicators: Sore, Grimacing, Guarding Pain Intervention(s): Limited activity within patient's tolerance, Monitored during session, Repositioned     Hand Dominance Left   Extremity/Trunk Assessment Upper Extremity Assessment Upper Extremity Assessment: RUE deficits/detail;LUE deficits/detail RUE Deficits / Details: decreased shoulder strength; mild edematous hand/wrist noted (worse on Left); pt reported feeling "stiffness" shoulders RUE Sensation: WNL RUE Coordination: decreased fine motor LUE Deficits / Details: decreased shoulder strength; mild edematous hand/wrist noted (worse on Left); pt reported feeling "stiffness" shoulders LUE Sensation: WNL LUE Coordination:  decreased fine motor   Lower Extremity Assessment Lower Extremity Assessment: Defer to PT evaluation RLE Sensation: history of peripheral neuropathy;decreased light touch   Cervical / Trunk Assessment Cervical / Trunk Assessment: Normal   Communication Communication Communication: No difficulties   Cognition Arousal/Alertness: Awake/alert Behavior During Therapy: WFL for tasks assessed/performed Overall Cognitive Status: Within Functional Limits for tasks assessed                                 General Comments: AAOx4. Pleasant throughout session.     General Comments  VSS on RA throughout session. PRAFO donned.    Exercises     Shoulder Instructions      Home Living Family/patient expects to be discharged to:: Private residence Living Arrangements: Alone Available Help at Discharge: Family;Available PRN/intermittently Type of Home: House Home Access: Level entry     Home Layout: One level     Bathroom Shower/Tub: Chief Strategy Officer: Standard     Home Equipment: Crutches;Cane - single point;Wheelchair - Forensic psychologist (2 wheels)          Prior Functioning/Environment Prior Level of Function : Independent/Modified Independent             Mobility Comments: using RW in his home, wheelchair in the community but cousin checks and delivers food ADLs Comments: independent        OT Problem List: Decreased strength;Decreased activity tolerance;Impaired balance (sitting and/or standing);Decreased coordination      OT Treatment/Interventions: Self-care/ADL training;Therapeutic exercise;Energy conservation;DME and/or AE instruction;Therapeutic activities;Patient/family education;Balance training    OT Goals(Current goals can be found in the care plan section) Acute Rehab OT Goals Patient  Stated Goal: To return home as independent as possible OT Goal Formulation: With patient Time For Goal Achievement: 08/26/22 Potential  to Achieve Goals: Good ADL Goals Pt Will Perform Grooming: sitting;with modified independence Pt Will Perform Lower Body Bathing: with modified independence;sitting/lateral leans Pt Will Transfer to Toilet: with modified independence;bedside commode;ambulating (with least restrictive AD) Pt Will Perform Toileting - Clothing Manipulation and hygiene: with modified independence;sitting/lateral leans Pt/caregiver will Perform Home Exercise Program: Increased strength;Both right and left upper extremity;With theraband;With theraputty;Independently;With written HEP provided (increased Bilateral fine motor coordination)  OT Frequency: Min 2X/week    Co-evaluation              AM-PAC OT "6 Clicks" Daily Activity     Outcome Measure Help from another person eating meals?: None Help from another person taking care of personal grooming?: A Little Help from another person toileting, which includes using toliet, bedpan, or urinal?: A Little Help from another person bathing (including washing, rinsing, drying)?: A Little Help from another person to put on and taking off regular upper body clothing?: A Little Help from another person to put on and taking off regular lower body clothing?: A Little 6 Click Score: 19   End of Session Equipment Utilized During Treatment: Rolling walker (2 wheels) Nurse Communication: Mobility status  Activity Tolerance: Patient tolerated treatment well;Treatment limited secondary to agitation Patient left: in chair;with call bell/phone within reach;with chair alarm set  OT Visit Diagnosis: Unsteadiness on feet (R26.81);Muscle weakness (generalized) (M62.81);Other abnormalities of gait and mobility (R26.89)                Time: 2542-7062 OT Time Calculation (min): 19 min Charges:  OT General Charges $OT Visit: 1 Visit OT Evaluation $OT Eval Moderate Complexity: 1 Mod  [address] OTR/L, MA Acute Rehab [phone]   Lendon Colonel 08/12/2022, 4:36  PM

## 2022-08-12 NOTE — Progress Notes (Signed)
Patient ID: Caleb [name], male   DOB: 12-03-1950, 72 y.o.   MRN: [id number] Patient states that he had chills at home with nausea and vomiting.  Patient is status post calcaneal excision for foot salvage intervention and status post bypass surgery to the right lower extremity.  Discussed with the patient there is progressive proximal wound breakdown and necrosis of the heel wound.  There is insufficient microcirculation for the wound to heal.  Recommended proceeding with a transtibial amputation.  Patient states he would like to proceed with surgery on Friday.

## 2022-08-12 NOTE — TOC Progression Note (Signed)
Transition of Care [facility]) - Progression Note    Patient Details  Name: Caleb [name] MRN: [id number] Date of Birth: 1951-02-03  Transition of Care [facility]) CM/SW Contact  Gordy Clement, RN Phone Number: 08/12/2022, 3:20 PM  Clinical Narrative:     Patient will  dc to home. Patient has been recommended HH PT and OT.  Frances Furbish has offered services in the past and will re-establish home health with patient.  Patient will have a procedure  (2nd opinion) next week. It is unclear if he will dc over the weekend and then return or not. HH is arranged. No DME has been recommended as of now .CM saw PT in unit and she gave verbal recs of HH PT and OT. No DME    TOC will continue to follow patient for any additional discharge needs             Expected Discharge Plan and Services                                               Social Determinants of Health (SDOH) Interventions SDOH Screenings   Food Insecurity: No Food Insecurity (08/12/2022)  Housing: Low Risk  (08/12/2022)  Transportation Needs: No Transportation Needs (08/12/2022)  Utilities: Not At Risk (08/12/2022)  Alcohol Screen: Low Risk  (10/29/2021)  Depression (PHQ2-9): Low Risk  (05/02/2022)  Financial Resource Strain: Low Risk  (10/29/2021)  Physical Activity: Sufficiently Active (12/06/2020)  Social Connections: Moderately Isolated (12/06/2020)  Stress: No Stress Concern Present (12/06/2020)  Tobacco Use: Low Risk  (08/11/2022)    Readmission Risk Interventions     No data to display

## 2022-08-12 NOTE — Progress Notes (Signed)
PROGRESS NOTE                                                                                                                                                                                                             Patient Demographics:    Caleb [name], is a 72 y.o. male, DOB - 05-14-1950, ZOX:[id number]  Outpatient Primary MD for the patient is Hoy Register, MD    LOS - 0  Admit date - 08/11/2022    Chief Complaint  Patient presents with   Hypotension   Toe Pain       Brief Narrative (HPI from H&P)   72 y.o. male with medical history significant of osteomyelitis of his right foot status post resection was on Bactrim for residual infection and due for outpatient BKA by Dr. Lajoyce Corners coming week, PVD status post stents with chronic wound, DM2, HTN, HLD, systolic CHF EF 25-30, aortic stenosis status post TAVR 2023, presented to the hospital with nausea vomiting and low blood pressure was found to have dehydration, hyponatremia along with AKI.  Of note he was recently started on Bactrim for his foot infection.  He was admitted to the hospital for treatment of dehydration, AKI.   Subjective:    Caleb [name] today has, No headache, No chest pain, No abdominal pain - No Nausea, No new weakness tingling or numbness, no SOB   Assessment  & Plan :   AKI superimposed on CKD stage IIIa, hyponatremia, dehydration due to nausea vomiting, Bactrim use, home diuretic and Entresto use. He is being hydrated with IV fluids, Bactrim and diuretics along with Entresto on hold.  Baseline creatinine around 1.6.  Monitor.  Chronic osteomyelitis of the left foot s/p amputation of left fourth and fifth toes as well as right partial calcaneal excision, present on admission.  Was on Bactrim now switched to Unasyn/Augmentin after discussions with ID physician Dr. Gwynn Burly, he follows with Dr. Lajoyce Corners, post discharge follow-up with Dr. Lajoyce Corners for  definitive amputation versus medical management.   Chronic systolic heart failure EF 25%.  Currently dehydrated.  Beta-blocker and Imdur to be continued, Entresto on hold due to AKI, monitor closely.    PAD. Has undergone right femoral-anterior tibial artery bypass with superficial endarterectomy of the common femoral artery back in 05/2022 by Dr. Sherral Hammers  On statin and Eliquis now for  secondary prevention.  Follows with Dr. Sherral Hammers.    Dyslipidemia.  On statin.  History of aortic stenosis s/p TAVR.  Monitor.  DM type II.  Sliding scale.  Lab Results  Component Value Date   HGBA1C 7.8 (H) 07/29/2022    CBG (last 3)  Recent Labs    08/11/22 2343 08/12/22 0417 08/12/22 0749  GLUCAP 173* 137* 128*        Condition - Fair  Family Communication  : None present  Code Status :  Full  Consults  :  ID, Ortho  PUD Prophylaxis :    Procedures  :           Disposition Plan  :    Status is: Inpatient   DVT Prophylaxis  :    SCDs Start: 08/12/22 0000 apixaban (ELIQUIS) tablet 5 mg    Lab Results  Component Value Date   PLT 496 (H) 08/12/2022    Diet :  Diet Order             Diet Carb Modified Fluid consistency: Thin; Room service appropriate? Yes  Diet effective now                    Inpatient Medications  Scheduled Meds:  apixaban  5 mg Oral BID   aspirin EC  81 mg Oral q AM   atorvastatin  40 mg Oral Daily   carvedilol  6.25 mg Oral BID WC   cyanocobalamin  500 mcg Oral q AM   ferrous sulfate  325 mg Oral Q breakfast   gabapentin  800 mg Oral TID   hydrALAZINE  10 mg Oral TID   insulin aspart  0-9 Units Subcutaneous Q4H   isosorbide mononitrate  15 mg Oral Daily   Continuous Infusions:  sodium chloride     ampicillin-sulbactam (UNASYN) IV 200 mL/hr at 08/12/22 0900   PRN Meds:.acetaminophen **OR** acetaminophen, fentaNYL (SUBLIMAZE) injection, ondansetron **OR** ondansetron (ZOFRAN) IV, oxyCODONE-acetaminophen, polyethylene  glycol  Antibiotics  :    Anti-infectives (From admission, onward)    Start     Dose/Rate Route Frequency Ordered Stop   08/11/22 2200  Ampicillin-Sulbactam (UNASYN) 3 g in sodium chloride 0.9 % 100 mL IVPB        3 g 200 mL/hr over 30 Minutes Intravenous Every 12 hours 08/11/22 2156           Objective:   Vitals:   08/11/22 2146 08/12/22 0032 08/12/22 0418 08/12/22 0746  BP: (!) 128/98 119/67 106/66 133/77  Pulse: 85 84 85 88  Resp: 20 16  18   Temp: 98.5 F (36.9 C) 98.6 F (37 C)  98.5 F (36.9 C)  TempSrc: Oral Oral  Oral  SpO2: 100% 99%  99%  Weight:      Height:        Wt Readings from Last 3 Encounters:  08/11/22 80 kg  08/04/22 80.9 kg  07/29/22 73 kg     Intake/Output Summary (Last 24 hours) at 08/12/2022 1018 Last data filed at 08/12/2022 0900 Gross per 24 hour  Intake 805.61 ml  Output --  Net 805.61 ml     Physical Exam  Awake Alert, No new F.N deficits, Normal affect [place].AT,PERRAL Supple Neck, No JVD,   Symmetrical Chest wall movement, Good air movement bilaterally, CTAB RRR,No Gallops,Rubs or new Murmurs,  +ve B.Sounds, Abd Soft, No tenderness,   No Cyanosis, right foot under bandage,     Data Review:    Recent Labs  Lab 08/11/22 1610 08/12/22 0803  WBC 12.3* 10.6*  HGB 8.4* 9.3*  HCT 25.8* 29.0*  PLT 487* 496*  MCV 83.5 81.7  MCH 27.2 26.2  MCHC 32.6 32.1  RDW 15.9* 16.0*  LYMPHSABS 1.6  --   MONOABS 1.1*  --   EOSABS 0.0  --   BASOSABS 0.0  --     Recent Labs  Lab 08/11/22 1610 08/11/22 1745 08/11/22 2211 08/11/22 2214 08/12/22 0803 08/12/22 0808  NA 127*  --  127*  --  128*  --   K 4.6  --  5.1  --  4.7  --   CL 92*  --  91*  --  93*  --   CO2 25  --  24  --  20*  --   ANIONGAP 10  --  12  --  15  --   GLUCOSE 144*  --  145*  --  130*  --   BUN 44*  --  45*  --  44*  --   CREATININE 2.60*  --  2.58*  --  2.27*  --   AST 21  --   --   --  27  --   ALT 16  --   --   --  19  --   ALKPHOS 81  --   --   --  84   --   BILITOT 0.6  --   --   --  0.5  --   ALBUMIN 2.5*  --   --   --  2.5*  --   CRP  --   --   --  27.7*  --   --   LATICACIDVEN 1.1 1.0  --   --   --   --   TSH  --   --   --  0.763  --   --   BNP  --   --   --   --   --  1,137.8*  MG 2.0  --   --   --  2.6*  --   CALCIUM 8.2*  --  8.9  --  8.5*  --       Recent Labs  Lab 08/11/22 1610 08/11/22 1745 08/11/22 2211 08/11/22 2214 08/12/22 0803 08/12/22 0808  CRP  --   --   --  27.7*  --   --   LATICACIDVEN 1.1 1.0  --   --   --   --   TSH  --   --   --  0.763  --   --   BNP  --   --   --   --   --  1,137.8*  MG 2.0  --   --   --  2.6*  --   CALCIUM 8.2*  --  8.9  --  8.5*  --     Recent Labs  Lab 08/11/22 1610 08/11/22 1745 08/11/22 2211 08/11/22 2214 08/12/22 0803  WBC 12.3*  --   --   --  10.6*  PLT 487*  --   --   --  496*  CRP  --   --   --  27.7*  --   LATICACIDVEN 1.1 1.0  --   --   --   CREATININE 2.60*  --  2.58*  --  2.27*    ------------------------------------------------------------------------------------------------------------------ Lab Results  Component Value Date   CHOL 88 06/14/2022   HDL 33 (L) 06/14/2022   LDLCALC  43 06/14/2022   TRIG 60 06/14/2022   CHOLHDL 2.7 06/14/2022    Lab Results  Component Value Date   HGBA1C 7.8 (H) 07/29/2022    Recent Labs    08/11/22 2214  TSH 0.763   ------------------------------------------------------------------------------------------------------------------ Cardiac Enzymes No results for input(s): "CKMB", "TROPONINI", "MYOGLOBIN" in the last 168 hours.  Invalid input(s): "CK"  Micro Results Recent Results (from the past 240 hour(s))  MRSA Next Gen by PCR, Nasal     Status: None   Collection Time: 08/11/22 11:00 PM   Specimen: Nasal Mucosa; Nasal Swab  Result Value Ref Range Status   MRSA by PCR Next Gen NOT DETECTED NOT DETECTED Final    Comment: (NOTE) The GeneXpert MRSA Assay (FDA approved for NASAL specimens only), is one component  of a comprehensive MRSA colonization surveillance program. It is not intended to diagnose MRSA infection nor to guide or monitor treatment for MRSA infections. Test performance is not FDA approved in patients less than 32 years old. Performed at [facility], [address], [place], [place] [address]     Radiology Reports DG Abd Portable 1V  Result Date: 08/11/2022 CLINICAL DATA:  Vomiting EXAM: PORTABLE ABDOMEN - 1 VIEW COMPARISON:  CT abdomen and pelvis 08/03/2022 FINDINGS: The bowel gas pattern is normal. No radio-opaque calculi or other significant radiographic abnormality are seen. IMPRESSION: Negative. Electronically Signed   By: Minerva Fester M.D.   On: 08/11/2022 18:44      Signature  -   Susa Raring M.D on 08/12/2022 at 10:18 AM   -  To page go to [email or web address]

## 2022-08-13 LAB — CBC WITH DIFFERENTIAL/PLATELET
Abs Immature Granulocytes: 0.05 10*3/uL (ref 0.00–0.07)
Basophils Absolute: 0 10*3/uL (ref 0.0–0.1)
Basophils Relative: 0 %
Eosinophils Absolute: 0.2 10*3/uL (ref 0.0–0.5)
Eosinophils Relative: 2 %
HCT: 25.9 % — ABNORMAL LOW (ref 39.0–52.0)
Hemoglobin: 8.3 g/dL — ABNORMAL LOW (ref 13.0–17.0)
Immature Granulocytes: 1 %
Lymphocytes Relative: 18 %
Lymphs Abs: 1.9 10*3/uL (ref 0.7–4.0)
MCH: 26.2 pg (ref 26.0–34.0)
MCHC: 32 g/dL (ref 30.0–36.0)
MCV: 81.7 fL (ref 80.0–100.0)
Monocytes Absolute: 1.4 10*3/uL — ABNORMAL HIGH (ref 0.1–1.0)
Monocytes Relative: 14 %
Neutro Abs: 6.9 10*3/uL (ref 1.7–7.7)
Neutrophils Relative %: 65 %
Platelets: 484 10*3/uL — ABNORMAL HIGH (ref 150–400)
RBC: 3.17 MIL/uL — ABNORMAL LOW (ref 4.22–5.81)
RDW: 15.9 % — ABNORMAL HIGH (ref 11.5–15.5)
WBC: 10.5 10*3/uL (ref 4.0–10.5)
nRBC: 0 % (ref 0.0–0.2)

## 2022-08-13 LAB — BASIC METABOLIC PANEL
Anion gap: 10 (ref 5–15)
BUN: 40 mg/dL — ABNORMAL HIGH (ref 8–23)
CO2: 24 mmol/L (ref 22–32)
Calcium: 8.4 mg/dL — ABNORMAL LOW (ref 8.9–10.3)
Chloride: 95 mmol/L — ABNORMAL LOW (ref 98–111)
Creatinine, Ser: 2.05 mg/dL — ABNORMAL HIGH (ref 0.61–1.24)
GFR, Estimated: 34 mL/min — ABNORMAL LOW (ref >60)
Glucose, Bld: 145 mg/dL — ABNORMAL HIGH (ref 70–99)
Potassium: 4.8 mmol/L (ref 3.5–5.1)
Sodium: 129 mmol/L — ABNORMAL LOW (ref 135–145)

## 2022-08-13 LAB — GLUCOSE, CAPILLARY
Glucose-Capillary: 137 mg/dL — ABNORMAL HIGH (ref 70–99)
Glucose-Capillary: 137 mg/dL — ABNORMAL HIGH (ref 70–99)
Glucose-Capillary: 187 mg/dL — ABNORMAL HIGH (ref 70–99)
Glucose-Capillary: 154 mg/dL — ABNORMAL HIGH (ref 70–99)
Glucose-Capillary: 169 mg/dL — ABNORMAL HIGH (ref 70–99)
Glucose-Capillary: 116 mg/dL — ABNORMAL HIGH (ref 70–99)

## 2022-08-13 LAB — TYPE AND SCREEN: ABO/RH(D): O POS

## 2022-08-13 LAB — MAGNESIUM: Magnesium: 2.5 mg/dL — ABNORMAL HIGH (ref 1.7–2.4)

## 2022-08-13 LAB — HEMOGLOBIN A1C
Hgb A1c MFr Bld: 7.9 % — ABNORMAL HIGH (ref 4.8–5.6)
Mean Plasma Glucose: 180 mg/dL

## 2022-08-13 LAB — PROTIME-INR
INR: 1.3 — ABNORMAL HIGH (ref 0.8–1.2)
Prothrombin Time: 16.3 seconds — ABNORMAL HIGH (ref 11.4–15.2)

## 2022-08-13 LAB — BPAM RBC: Blood Product Expiration Date: 202406222359

## 2022-08-13 LAB — BRAIN NATRIURETIC PEPTIDE: B Natriuretic Peptide: 1032.8 pg/mL — ABNORMAL HIGH (ref 0.0–100.0)

## 2022-08-13 LAB — PROCALCITONIN: Procalcitonin: 0.47 ng/mL

## 2022-08-13 LAB — C-REACTIVE PROTEIN: CRP: 26.2 mg/dL — ABNORMAL HIGH (ref <1.0)

## 2022-08-13 MED ORDER — SODIUM CHLORIDE 0.9 % IV SOLN: INTRAVENOUS | Status: DC

## 2022-08-13 MED ORDER — HYDROCODONE-ACETAMINOPHEN 5-325 MG PO TABS
1.0000 | ORAL_TABLET | Freq: Four times a day (QID) | ORAL | Status: DC | PRN
  Administered 2022-08-13 – 2022-08-15 (×6): 1 via ORAL
  Filled 2022-08-13 (×7): qty 1

## 2022-08-13 MED ORDER — FUROSEMIDE 40 MG PO TABS
40.0000 mg | ORAL_TABLET | Freq: Once | ORAL | Status: AC
  Administered 2022-08-13: 40 mg via ORAL
  Filled 2022-08-13: qty 1

## 2022-08-13 MED ORDER — AMOXICILLIN-POT CLAVULANATE 500-125 MG PO TABS
1.0000 | ORAL_TABLET | Freq: Two times a day (BID) | ORAL | Status: DC
  Administered 2022-08-13 – 2022-08-15 (×4): 1 via ORAL
  Filled 2022-08-13 (×5): qty 1

## 2022-08-13 NOTE — Plan of Care (Signed)
  Problem: Education: Goal: Knowledge of the prescribed therapeutic regimen will improve Outcome: Progressing Goal: Ability to verbalize activity precautions or restrictions will improve Outcome: Progressing Goal: Understanding of discharge needs will improve Outcome: Progressing   Problem: Activity: Goal: Ability to perform//tolerate increased activity and mobilize with assistive devices will improve Outcome: Progressing   Problem: Clinical Measurements: Goal: Postoperative complications will be avoided or minimized Outcome: Progressing   Problem: Self-Care: Goal: Ability to meet self-care needs will improve Outcome: Progressing   Problem: Self-Concept: Goal: Ability to maintain and perform role responsibilities to the fullest extent possible will improve Outcome: Progressing   Problem: Pain Management: Goal: Pain level will decrease with appropriate interventions Outcome: Progressing   Problem: Skin Integrity: Goal: Demonstration of wound healing without infection will improve Outcome: Progressing   Problem: Education: Goal: Ability to describe self-care measures that may prevent or decrease complications (Diabetes Survival Skills Education) will improve Outcome: Progressing Goal: Individualized Educational Video(s) Outcome: Progressing   Problem: Coping: Goal: Ability to adjust to condition or change in health will improve Outcome: Progressing   Problem: Fluid Volume: Goal: Ability to maintain a balanced intake and output will improve Outcome: Progressing   Problem: Health Behavior/Discharge Planning: Goal: Ability to identify and utilize available resources and services will improve Outcome: Progressing Goal: Ability to manage health-related needs will improve Outcome: Progressing   Problem: Metabolic: Goal: Ability to maintain appropriate glucose levels will improve Outcome: Progressing   Problem: Nutritional: Goal: Maintenance of adequate nutrition will  improve Outcome: Progressing Goal: Progress toward achieving an optimal weight will improve Outcome: Progressing   Problem: Skin Integrity: Goal: Risk for impaired skin integrity will decrease Outcome: Progressing   Problem: Tissue Perfusion: Goal: Adequacy of tissue perfusion will improve Outcome: Progressing   Problem: Education: Goal: Knowledge of General Education information will improve Description: Including pain rating scale, medication(s)/side effects and non-pharmacologic comfort measures Outcome: Progressing   Problem: Health Behavior/Discharge Planning: Goal: Ability to manage health-related needs will improve Outcome: Progressing   Problem: Clinical Measurements: Goal: Ability to maintain clinical measurements within normal limits will improve Outcome: Progressing Goal: Will remain free from infection Outcome: Progressing Goal: Diagnostic test results will improve Outcome: Progressing Goal: Respiratory complications will improve Outcome: Progressing Goal: Cardiovascular complication will be avoided Outcome: Progressing   Problem: Activity: Goal: Risk for activity intolerance will decrease Outcome: Progressing   Problem: Nutrition: Goal: Adequate nutrition will be maintained Outcome: Progressing   Problem: Coping: Goal: Level of anxiety will decrease Outcome: Progressing   Problem: Elimination: Goal: Will not experience complications related to bowel motility Outcome: Progressing Goal: Will not experience complications related to urinary retention Outcome: Progressing   Problem: Pain Managment: Goal: General experience of comfort will improve Outcome: Progressing   Problem: Safety: Goal: Ability to remain free from injury will improve Outcome: Progressing   Problem: Skin Integrity: Goal: Risk for impaired skin integrity will decrease Outcome: Progressing   

## 2022-08-13 NOTE — Progress Notes (Signed)
PROGRESS NOTE                                                                                                                                                                                                             Patient Demographics:    Caleb [name], is a 72 y.o. male, DOB - Aug 26, 1950, ZOX:[id number]  Outpatient Primary MD for the patient is Hoy Register, MD    LOS - 1  Admit date - 08/11/2022    Chief Complaint  Patient presents with   Hypotension   Toe Pain       Brief Narrative (HPI from H&P)   72 y.o. male with medical history significant of osteomyelitis of his right foot status post resection was on Bactrim for residual infection and due for outpatient BKA by Dr. Lajoyce Corners coming week, PVD status post stents with chronic wound, DM2, HTN, HLD, systolic CHF EF 25-30, aortic stenosis status post TAVR 2023, presented to the hospital with nausea vomiting and low blood pressure was found to have dehydration, hyponatremia along with AKI.  Of note he was recently started on Bactrim for his foot infection.  He was admitted to the hospital for treatment of dehydration, AKI.   Subjective:   Patient in bed, appears comfortable, denies any headache, no fever, no chest pain or pressure, no shortness of breath , no abdominal pain. No new focal weakness.   Assessment  & Plan :   AKI superimposed on CKD stage IIIa, hyponatremia, dehydration due to nausea vomiting, Bactrim use, home diuretic and Entresto use. He is being hydrated with IV fluids, Bactrim and diuretics along with Entresto on hold.  Baseline creatinine around 1.6.  Renal function has improved we will continue to monitor.  Some element of chronic hyponatremia due to underlying pain and SIADH as well.  Chronic osteomyelitis of the left foot s/p amputation of left fourth and fifth toes as well as right partial calcaneal excision, present on admission.  Was on Bactrim now  switched to Unasyn/Augmentin after discussions with ID physician Dr. Gwynn Burly on 08/12/2022, he follows with Dr. Lajoyce Corners, post discharge follow-up with Dr. Lajoyce Corners for definitive amputation versus medical management.   Chronic systolic heart failure EF 25%.  Currently dehydrated.  Beta-blocker and Imdur to be continued, Entresto on hold due to AKI, monitor closely.    PAD. Has  undergone right femoral-anterior tibial artery bypass with superficial endarterectomy of the common femoral artery back in 05/2022 by Dr. Sherral Hammers  On statin and Eliquis now for secondary prevention.  Follows with Dr. Sherral Hammers.    Dyslipidemia.  On statin.  History of aortic stenosis s/p TAVR.  Monitor.  DM type II.  Sliding scale.  Lab Results  Component Value Date   HGBA1C 7.9 (H) 08/11/2022    CBG (last 3)  Recent Labs    08/12/22 2335 08/13/22 0317 08/13/22 0754  GLUCAP 136* 137* 137*        Condition - Fair  Family Communication  : None present  Code Status :  Full  Consults  :  ID, Ortho  PUD Prophylaxis :    Procedures  :           Disposition Plan  :    Status is: Inpatient   DVT Prophylaxis  :    SCDs Start: 08/12/22 0000 apixaban (ELIQUIS) tablet 5 mg    Lab Results  Component Value Date   PLT 484 (H) 08/13/2022    Diet :  Diet Order             Diet Carb Modified Fluid consistency: Thin; Room service appropriate? Yes; Fluid restriction: 1200 mL Fluid  Diet effective now                    Inpatient Medications  Scheduled Meds:  amoxicillin-clavulanate  1 tablet Oral Q8H   apixaban  5 mg Oral BID   aspirin EC  81 mg Oral q AM   atorvastatin  40 mg Oral Daily   carvedilol  6.25 mg Oral BID WC   cyanocobalamin  500 mcg Oral q AM   feeding supplement  237 mL Oral BID BM   ferrous sulfate  325 mg Oral Q breakfast   gabapentin  300 mg Oral BID   hydrALAZINE  10 mg Oral TID   insulin aspart  0-9 Units Subcutaneous Q4H   isosorbide mononitrate  15 mg Oral  Daily   multivitamin with minerals  1 tablet Oral Daily   Continuous Infusions:   PRN Meds:.acetaminophen **OR** acetaminophen, HYDROcodone-acetaminophen, ondansetron **OR** ondansetron (ZOFRAN) IV, polyethylene glycol    Objective:   Vitals:   08/12/22 2024 08/12/22 2336 08/13/22 0318 08/13/22 0752  BP: 105/70 108/72 108/77 106/63  Pulse: 82 85 90   Resp:  18 16 17   Temp: 98.9 F (37.2 C) 98.6 F (37 C) 98.6 F (37 C) 98.3 F (36.8 C)  TempSrc: Oral Oral Oral Oral  SpO2:   94%   Weight:      Height:        Wt Readings from Last 3 Encounters:  08/11/22 80 kg  08/04/22 80.9 kg  07/29/22 73 kg     Intake/Output Summary (Last 24 hours) at 08/13/2022 0936 Last data filed at 08/12/2022 1800 Gross per 24 hour  Intake 1463.77 ml  Output 1250 ml  Net 213.77 ml     Physical Exam  Awake Alert, No new F.N deficits, Normal affect [place].AT,PERRAL Supple Neck, No JVD,   Symmetrical Chest wall movement, Good air movement bilaterally, CTAB RRR,No Gallops,Rubs or new Murmurs,  +ve B.Sounds, Abd Soft, No tenderness,   No Cyanosis, right foot under bandage,     Data Review:    Recent Labs  Lab 08/11/22 1610 08/12/22 0803 08/13/22 0232  WBC 12.3* 10.6* 10.5  HGB 8.4* 9.3* 8.3*  HCT 25.8* 29.0* 25.9*  PLT 487* 496* 484*  MCV 83.5 81.7 81.7  MCH 27.2 26.2 26.2  MCHC 32.6 32.1 32.0  RDW 15.9* 16.0* 15.9*  LYMPHSABS 1.6  --  1.9  MONOABS 1.1*  --  1.4*  EOSABS 0.0  --  0.2  BASOSABS 0.0  --  0.0    Recent Labs  Lab 08/11/22 1610 08/11/22 1745 08/11/22 2211 08/11/22 2214 08/12/22 0803 08/12/22 0808 08/13/22 0232  NA 127*  --  127*  --  128*  --  129*  K 4.6  --  5.1  --  4.7  --  4.8  CL 92*  --  91*  --  93*  --  95*  CO2 25  --  24  --  20*  --  24  ANIONGAP 10  --  12  --  15  --  10  GLUCOSE 144*  --  145*  --  130*  --  145*  BUN 44*  --  45*  --  44*  --  40*  CREATININE 2.60*  --  2.58*  --  2.27*  --  2.05*  AST 21  --   --   --  27  --   --    ALT 16  --   --   --  19  --   --   ALKPHOS 81  --   --   --  84  --   --   BILITOT 0.6  --   --   --  0.5  --   --   ALBUMIN 2.5*  --   --   --  2.5*  --   --   CRP  --   --   --  27.7*  --  27.3* 26.2*  PROCALCITON  --   --   --   --   --  0.52 0.47  LATICACIDVEN 1.1 1.0  --   --   --   --   --   INR  --   --   --   --   --   --  1.3*  TSH  --   --   --  0.763  --   --   --   HGBA1C 7.9*  --   --   --   --   --   --   BNP  --   --   --   --   --  1,137.8* 1,032.8*  MG 2.0  --   --   --  2.6*  --  2.5*  CALCIUM 8.2*  --  8.9  --  8.5*  --  8.4*      Recent Labs  Lab 08/11/22 1610 08/11/22 1745 08/11/22 2211 08/11/22 2214 08/12/22 0803 08/12/22 0808 08/13/22 0232  CRP  --   --   --  27.7*  --  27.3* 26.2*  PROCALCITON  --   --   --   --   --  0.52 0.47  LATICACIDVEN 1.1 1.0  --   --   --   --   --   INR  --   --   --   --   --   --  1.3*  TSH  --   --   --  0.763  --   --   --   HGBA1C 7.9*  --   --   --   --   --   --  BNP  --   --   --   --   --  1,137.8* 1,032.8*  MG 2.0  --   --   --  2.6*  --  2.5*  CALCIUM 8.2*  --  8.9  --  8.5*  --  8.4*    Lab Results  Component Value Date   HGBA1C 7.9 (H) 08/11/2022    Recent Labs    08/11/22 2214  TSH 0.763    Radiology Reports DG Abd Portable 1V  Result Date: 08/11/2022 CLINICAL DATA:  Vomiting EXAM: PORTABLE ABDOMEN - 1 VIEW COMPARISON:  CT abdomen and pelvis 08/03/2022 FINDINGS: The bowel gas pattern is normal. No radio-opaque calculi or other significant radiographic abnormality are seen. IMPRESSION: Negative. Electronically Signed   By: Minerva Fester M.D.   On: 08/11/2022 18:44      Signature  -   Susa Raring M.D on 08/13/2022 at 9:36 AM   -  To page go to [email or web address]

## 2022-08-13 NOTE — Progress Notes (Signed)
Mobility Specialist Progress Note   08/13/22 1400  Mobility  Activity Transferred from bed to chair  Level of Assistance Minimal assist, patient does 75% or more  Assistive Device Front wheel walker  Distance Ambulated (ft) 2 ft  RLE Weight Bearing NWB  Activity Response Tolerated well  Mobility Referral Yes  $Mobility charge 1 Mobility  Mobility Specialist Start Time (ACUTE ONLY) 1430  Mobility Specialist Stop Time (ACUTE ONLY) 1450  Mobility Specialist Time Calculation (min) (ACUTE ONLY) 20 min   Received pt in bed having no complaints and agreeable. Standby A to EOB and MinA to stand d/t generalized weakness but pt able to maintain NWB status. Hop gait for 3 steps and minA to maneuver RW but no faults on transfer. Left w/ call bell in reach and chair alarm on.  Frederico Hamman Mobility Specialist Please contact via SecureChat or  Rehab office at [phone]

## 2022-08-14 LAB — CBC WITH DIFFERENTIAL/PLATELET
Abs Immature Granulocytes: 0.05 10*3/uL (ref 0.00–0.07)
Basophils Absolute: 0 10*3/uL (ref 0.0–0.1)
Basophils Relative: 0 %
Eosinophils Absolute: 0.3 10*3/uL (ref 0.0–0.5)
Eosinophils Relative: 3 %
HCT: 25 % — ABNORMAL LOW (ref 39.0–52.0)
Hemoglobin: 8 g/dL — ABNORMAL LOW (ref 13.0–17.0)
Immature Granulocytes: 0 %
Lymphocytes Relative: 12 %
Lymphs Abs: 1.4 10*3/uL (ref 0.7–4.0)
MCH: 26.2 pg (ref 26.0–34.0)
MCHC: 32 g/dL (ref 30.0–36.0)
MCV: 82 fL (ref 80.0–100.0)
Monocytes Absolute: 1 10*3/uL (ref 0.1–1.0)
Monocytes Relative: 9 %
Neutro Abs: 8.5 10*3/uL — ABNORMAL HIGH (ref 1.7–7.7)
Neutrophils Relative %: 76 %
Platelets: 432 10*3/uL — ABNORMAL HIGH (ref 150–400)
RBC: 3.05 MIL/uL — ABNORMAL LOW (ref 4.22–5.81)
RDW: 15.9 % — ABNORMAL HIGH (ref 11.5–15.5)
WBC: 11.2 10*3/uL — ABNORMAL HIGH (ref 4.0–10.5)
nRBC: 0 % (ref 0.0–0.2)

## 2022-08-14 LAB — HEPATIC FUNCTION PANEL
ALT: 24 U/L (ref 0–44)
AST: 33 U/L (ref 15–41)
Albumin: 2.3 g/dL — ABNORMAL LOW (ref 3.5–5.0)
Alkaline Phosphatase: 105 U/L (ref 38–126)
Bilirubin, Direct: <0.1 mg/dL (ref 0.0–0.2)
Total Bilirubin: 0.5 mg/dL (ref 0.3–1.2)
Total Protein: 7 g/dL (ref 6.5–8.1)

## 2022-08-14 LAB — BASIC METABOLIC PANEL
Anion gap: 10 (ref 5–15)
BUN: 41 mg/dL — ABNORMAL HIGH (ref 8–23)
CO2: 23 mmol/L (ref 22–32)
Calcium: 8.1 mg/dL — ABNORMAL LOW (ref 8.9–10.3)
Chloride: 94 mmol/L — ABNORMAL LOW (ref 98–111)
Creatinine, Ser: 1.77 mg/dL — ABNORMAL HIGH (ref 0.61–1.24)
GFR, Estimated: 40 mL/min — ABNORMAL LOW (ref >60)
Glucose, Bld: 182 mg/dL — ABNORMAL HIGH (ref 70–99)
Potassium: 5 mmol/L (ref 3.5–5.1)
Sodium: 127 mmol/L — ABNORMAL LOW (ref 135–145)

## 2022-08-14 LAB — MAGNESIUM: Magnesium: 2.2 mg/dL (ref 1.7–2.4)

## 2022-08-14 LAB — GLUCOSE, CAPILLARY
Glucose-Capillary: 165 mg/dL — ABNORMAL HIGH (ref 70–99)
Glucose-Capillary: 165 mg/dL — ABNORMAL HIGH (ref 70–99)
Glucose-Capillary: 244 mg/dL — ABNORMAL HIGH (ref 70–99)
Glucose-Capillary: 148 mg/dL — ABNORMAL HIGH (ref 70–99)
Glucose-Capillary: 218 mg/dL — ABNORMAL HIGH (ref 70–99)

## 2022-08-14 LAB — BRAIN NATRIURETIC PEPTIDE: B Natriuretic Peptide: 951.5 pg/mL — ABNORMAL HIGH (ref 0.0–100.0)

## 2022-08-14 LAB — PROCALCITONIN: Procalcitonin: 0.28 ng/mL

## 2022-08-14 LAB — C-REACTIVE PROTEIN: CRP: 23.6 mg/dL — ABNORMAL HIGH (ref <1.0)

## 2022-08-14 MED ORDER — TOLVAPTAN 15 MG PO TABS
15.0000 mg | ORAL_TABLET | Freq: Once | ORAL | Status: AC
  Administered 2022-08-14: 15 mg via ORAL
  Filled 2022-08-14: qty 1

## 2022-08-14 MED ORDER — HYDROCODONE-ACETAMINOPHEN 5-325 MG PO TABS
1.0000 | ORAL_TABLET | Freq: Once | ORAL | Status: AC
  Administered 2022-08-14: 1 via ORAL

## 2022-08-14 MED ORDER — CARVEDILOL 3.125 MG PO TABS: 3.1250 mg | ORAL_TABLET | Freq: Two times a day (BID) | ORAL | Status: DC

## 2022-08-14 MED ORDER — HYDROMORPHONE HCL 2 MG PO TABS
1.0000 mg | ORAL_TABLET | Freq: Once | ORAL | Status: AC
  Administered 2022-08-14: 1 mg via ORAL
  Filled 2022-08-14: qty 1

## 2022-08-14 NOTE — Progress Notes (Signed)
PROGRESS NOTE                                                                                                                                                                                                             Patient Demographics:    Caleb [name], is a 72 y.o. male, DOB - [birth year], BJY:[id number]  Outpatient Primary MD for the patient is Hoy Register, MD    LOS - 2  Admit date - 08/11/2022    Chief Complaint  Patient presents with   Hypotension   Toe Pain       Brief Narrative (HPI from H&P)   72 y.o. male with medical history significant of osteomyelitis of his right foot status post resection was on Bactrim for residual infection and due for outpatient BKA by Dr. Lajoyce Corners coming week, PVD status post stents with chronic wound, DM2, HTN, HLD, systolic CHF EF 25-30, aortic stenosis status post TAVR 2023, presented to the hospital with nausea vomiting and low blood pressure was found to have dehydration, hyponatremia along with AKI.  Of note he was recently started on Bactrim for his foot infection.  He was admitted to the hospital for treatment of dehydration, AKI.   Subjective:   Patient in bed, appears comfortable, denies any headache, no fever, no chest pain or pressure, no shortness of breath , no abdominal pain. No new focal weakness.  Right foot pain stable.   Assessment  & Plan :   AKI superimposed on CKD stage IIIa, hyponatremia, dehydration due to nausea vomiting, Bactrim use, home diuretic and Entresto use. He is being hydrated with IV fluids, Bactrim and diuretics along with Entresto on hold.  Baseline creatinine around 1.6.  Renal function has improved we will continue to monitor.     Chronic osteomyelitis of the left foot s/p amputation of left fourth and fifth toes as well as right partial calcaneal excision, present on admission.  Was on Bactrim now switched to Unasyn/Augmentin after discussions with  ID physician Dr. Gwynn Burly on 08/12/2022, he follows with Dr. Lajoyce Corners, post discharge follow-up with Dr. Lajoyce Corners for definitive amputation versus medical management.   Chronic systolic heart failure EF 25%.  Euvolemic.  Beta-blocker and Imdur to be continued, Entresto on hold due to AKI, monitor closely.    Hyponatremia.  SIADH due to pain, failed fluid restriction  and Lasix, Samsca on 08/14/2022 and monitor.    PAD. Has undergone right femoral-anterior tibial artery bypass with superficial endarterectomy of the common femoral artery back in 05/2022 by Dr. Sherral Hammers  On statin and Eliquis now for secondary prevention.  Follows with Dr. Sherral Hammers.    Dyslipidemia.  On statin.  History of aortic stenosis s/p TAVR.  Monitor.  DM type II.  Sliding scale.  Lab Results  Component Value Date   HGBA1C 7.9 (H) 08/11/2022    CBG (last 3)  Recent Labs    08/13/22 2326 08/14/22 0348 08/14/22 0857  GLUCAP 116* 165* 165*        Condition - Fair  Family Communication  : None present  Code Status :  Full  Consults  :  ID, Ortho  PUD Prophylaxis :    Procedures  :           Disposition Plan  :    Status is: Inpatient   DVT Prophylaxis  :    SCDs Start: 08/12/22 0000 apixaban (ELIQUIS) tablet 5 mg    Lab Results  Component Value Date   PLT 432 (H) 08/14/2022    Diet :  Diet Order             Diet Carb Modified Fluid consistency: Thin; Room service appropriate? Yes  Diet effective now                    Inpatient Medications  Scheduled Meds:  amoxicillin-clavulanate  1 tablet Oral BID   apixaban  5 mg Oral BID   aspirin EC  81 mg Oral q AM   atorvastatin  40 mg Oral Daily   [START ON 08/15/2022] carvedilol  3.125 mg Oral BID WC   cyanocobalamin  500 mcg Oral q AM   feeding supplement  237 mL Oral BID BM   ferrous sulfate  325 mg Oral Q breakfast   gabapentin  300 mg Oral BID   hydrALAZINE  10 mg Oral TID   insulin aspart  0-9 Units Subcutaneous Q4H    isosorbide mononitrate  15 mg Oral Daily   multivitamin with minerals  1 tablet Oral Daily   Continuous Infusions:   PRN Meds:.acetaminophen **OR** acetaminophen, HYDROcodone-acetaminophen, ondansetron **OR** ondansetron (ZOFRAN) IV, polyethylene glycol    Objective:   Vitals:   08/13/22 2000 08/13/22 2327 08/14/22 0348 08/14/22 0856  BP: 99/67 128/75 120/78 100/65  Pulse: 86 100 (!) 115 99  Resp: 16 17 19 17   Temp: 98.2 F (36.8 C) 98.3 F (36.8 C) 98.4 F (36.9 C) 98.3 F (36.8 C)  TempSrc: Oral Oral Oral Oral  SpO2: 93%     Weight:      Height:        Wt Readings from Last 3 Encounters:  08/11/22 80 kg  08/04/22 80.9 kg  07/29/22 73 kg     Intake/Output Summary (Last 24 hours) at 08/14/2022 1036 Last data filed at 08/14/2022 1003 Gross per 24 hour  Intake --  Output 1200 ml  Net -1200 ml     Physical Exam  Awake Alert, No new F.N deficits, Normal affect [place].AT,PERRAL Supple Neck, No JVD,   Symmetrical Chest wall movement, Good air movement bilaterally, CTAB RRR,No Gallops,Rubs or new Murmurs,  +ve B.Sounds, Abd Soft, No tenderness,   No Cyanosis, right foot under bandage,     Data Review:    Recent Labs  Lab 08/11/22 1610 08/12/22 0803 08/13/22 0232 08/14/22 0422  WBC 12.3*  10.6* 10.5 11.2*  HGB 8.4* 9.3* 8.3* 8.0*  HCT 25.8* 29.0* 25.9* 25.0*  PLT 487* 496* 484* 432*  MCV 83.5 81.7 81.7 82.0  MCH 27.2 26.2 26.2 26.2  MCHC 32.6 32.1 32.0 32.0  RDW 15.9* 16.0* 15.9* 15.9*  LYMPHSABS 1.6  --  1.9 1.4  MONOABS 1.1*  --  1.4* 1.0  EOSABS 0.0  --  0.2 0.3  BASOSABS 0.0  --  0.0 0.0    Recent Labs  Lab 08/11/22 1610 08/11/22 1745 08/11/22 2211 08/11/22 2214 08/12/22 0803 08/12/22 0808 08/13/22 0232 08/14/22 0422 08/14/22 0445  NA 127*  --  127*  --  128*  --  129* 127*  --   K 4.6  --  5.1  --  4.7  --  4.8 5.0  --   CL 92*  --  91*  --  93*  --  95* 94*  --   CO2 25  --  24  --  20*  --  24 23  --   ANIONGAP 10  --  12  --  15  --   10 10  --   GLUCOSE 144*  --  145*  --  130*  --  145* 182*  --   BUN 44*  --  45*  --  44*  --  40* 41*  --   CREATININE 2.60*  --  2.58*  --  2.27*  --  2.05* 1.77*  --   AST 21  --   --   --  27  --   --   --  33  ALT 16  --   --   --  19  --   --   --  24  ALKPHOS 81  --   --   --  84  --   --   --  105  BILITOT 0.6  --   --   --  0.5  --   --   --  0.5  ALBUMIN 2.5*  --   --   --  2.5*  --   --   --  2.3*  CRP  --   --   --  27.7*  --  27.3* 26.2* 23.6*  --   PROCALCITON  --   --   --   --   --  0.52 0.47 0.28  --   LATICACIDVEN 1.1 1.0  --   --   --   --   --   --   --   INR  --   --   --   --   --   --  1.3*  --   --   TSH  --   --   --  0.763  --   --   --   --   --   HGBA1C 7.9*  --   --   --   --   --   --   --   --   BNP  --   --   --   --   --  1,137.8* 1,032.8* 951.5*  --   MG 2.0  --   --   --  2.6*  --  2.5* 2.2  --   CALCIUM 8.2*  --  8.9  --  8.5*  --  8.4* 8.1*  --       Recent Labs  Lab 08/11/22 1610 08/11/22 1745 08/11/22 2211 08/11/22 2214  08/12/22 0803 08/12/22 4098 08/13/22 0232 08/14/22 0422  CRP  --   --   --  27.7*  --  27.3* 26.2* 23.6*  PROCALCITON  --   --   --   --   --  0.52 0.47 0.28  LATICACIDVEN 1.1 1.0  --   --   --   --   --   --   INR  --   --   --   --   --   --  1.3*  --   TSH  --   --   --  0.763  --   --   --   --   HGBA1C 7.9*  --   --   --   --   --   --   --   BNP  --   --   --   --   --  1,137.8* 1,032.8* 951.5*  MG 2.0  --   --   --  2.6*  --  2.5* 2.2  CALCIUM 8.2*  --  8.9  --  8.5*  --  8.4* 8.1*    Lab Results  Component Value Date   HGBA1C 7.9 (H) 08/11/2022    Recent Labs    08/11/22 2214  TSH 0.763    Radiology Reports DG Abd Portable 1V  Result Date: 08/11/2022 CLINICAL DATA:  Vomiting EXAM: PORTABLE ABDOMEN - 1 VIEW COMPARISON:  CT abdomen and pelvis 08/03/2022 FINDINGS: The bowel gas pattern is normal. No radio-opaque calculi or other significant radiographic abnormality are seen. IMPRESSION: Negative.  Electronically Signed   By: Minerva Fester M.D.   On: 08/11/2022 18:44      Signature  -   Susa Raring M.D on 08/14/2022 at 10:36 AM   -  To page go to [email or web address]

## 2022-08-15 ENCOUNTER — Inpatient Hospital Stay (HOSPITAL_COMMUNITY): Payer: Medicare HMO

## 2022-08-15 LAB — CBC WITH DIFFERENTIAL/PLATELET
Abs Immature Granulocytes: 0.06 10*3/uL (ref 0.00–0.07)
Basophils Absolute: 0 10*3/uL (ref 0.0–0.1)
Basophils Relative: 0 %
Eosinophils Absolute: 0.4 10*3/uL (ref 0.0–0.5)
Eosinophils Relative: 4 %
HCT: 24.6 % — ABNORMAL LOW (ref 39.0–52.0)
Hemoglobin: 7.7 g/dL — ABNORMAL LOW (ref 13.0–17.0)
Immature Granulocytes: 1 %
Lymphocytes Relative: 15 %
Lymphs Abs: 1.8 10*3/uL (ref 0.7–4.0)
MCH: 25.8 pg — ABNORMAL LOW (ref 26.0–34.0)
MCHC: 31.3 g/dL (ref 30.0–36.0)
MCV: 82.3 fL (ref 80.0–100.0)
Monocytes Absolute: 1.2 10*3/uL — ABNORMAL HIGH (ref 0.1–1.0)
Monocytes Relative: 10 %
Neutro Abs: 8.1 10*3/uL — ABNORMAL HIGH (ref 1.7–7.7)
Neutrophils Relative %: 70 %
Platelets: 458 10*3/uL — ABNORMAL HIGH (ref 150–400)
RBC: 2.99 MIL/uL — ABNORMAL LOW (ref 4.22–5.81)
RDW: 16.2 % — ABNORMAL HIGH (ref 11.5–15.5)
WBC: 11.6 10*3/uL — ABNORMAL HIGH (ref 4.0–10.5)
nRBC: 0 % (ref 0.0–0.2)

## 2022-08-15 LAB — GLUCOSE, CAPILLARY
Glucose-Capillary: 114 mg/dL — ABNORMAL HIGH (ref 70–99)
Glucose-Capillary: 151 mg/dL — ABNORMAL HIGH (ref 70–99)
Glucose-Capillary: 166 mg/dL — ABNORMAL HIGH (ref 70–99)
Glucose-Capillary: 190 mg/dL — ABNORMAL HIGH (ref 70–99)

## 2022-08-15 LAB — PROCALCITONIN: Procalcitonin: 0.26 ng/mL

## 2022-08-15 LAB — BASIC METABOLIC PANEL
Anion gap: 10 (ref 5–15)
BUN: 35 mg/dL — ABNORMAL HIGH (ref 8–23)
CO2: 25 mmol/L (ref 22–32)
Calcium: 8.5 mg/dL — ABNORMAL LOW (ref 8.9–10.3)
Chloride: 98 mmol/L (ref 98–111)
Creatinine, Ser: 1.47 mg/dL — ABNORMAL HIGH (ref 0.61–1.24)
GFR, Estimated: 50 mL/min — ABNORMAL LOW (ref >60)
Glucose, Bld: 144 mg/dL — ABNORMAL HIGH (ref 70–99)
Potassium: 5.4 mmol/L — ABNORMAL HIGH (ref 3.5–5.1)
Sodium: 133 mmol/L — ABNORMAL LOW (ref 135–145)

## 2022-08-15 LAB — C-REACTIVE PROTEIN: CRP: 26.5 mg/dL — ABNORMAL HIGH (ref <1.0)

## 2022-08-15 LAB — BRAIN NATRIURETIC PEPTIDE: B Natriuretic Peptide: 1449 pg/mL — ABNORMAL HIGH (ref 0.0–100.0)

## 2022-08-15 LAB — MAGNESIUM: Magnesium: 2.1 mg/dL (ref 1.7–2.4)

## 2022-08-15 MED ORDER — SODIUM ZIRCONIUM CYCLOSILICATE 5 G PO PACK
5.0000 g | PACK | Freq: Once | ORAL | Status: AC
  Administered 2022-08-15: 5 g via ORAL
  Filled 2022-08-15: qty 1

## 2022-08-15 MED ORDER — FUROSEMIDE 80 MG PO TABS
80.0000 mg | ORAL_TABLET | Freq: Every day | ORAL | 0 refills | Status: DC
Start: 2022-08-15 — End: 2022-08-22

## 2022-08-15 MED ORDER — FUROSEMIDE 40 MG PO TABS
40.0000 mg | ORAL_TABLET | Freq: Every day | ORAL | 0 refills | Status: DC
Start: 2022-08-15 — End: 2022-08-15

## 2022-08-15 MED ORDER — GABAPENTIN 300 MG PO CAPS: 300.0000 mg | ORAL_CAPSULE | Freq: Three times a day (TID) | ORAL | 0 refills | Status: DC

## 2022-08-15 MED ORDER — AMOXICILLIN-POT CLAVULANATE 500-125 MG PO TABS: 1.0000 | ORAL_TABLET | Freq: Two times a day (BID) | ORAL | 0 refills | Status: DC

## 2022-08-15 MED ORDER — SODIUM ZIRCONIUM CYCLOSILICATE 10 G PO PACK
10.0000 g | PACK | Freq: Once | ORAL | Status: AC
  Administered 2022-08-15: 10 g via ORAL
  Filled 2022-08-15: qty 1

## 2022-08-15 MED ORDER — FUROSEMIDE 10 MG/ML IJ SOLN: 20.0000 mg | Freq: Once | INTRAMUSCULAR | Status: DC

## 2022-08-15 MED ORDER — FUROSEMIDE 10 MG/ML IJ SOLN
40.0000 mg | Freq: Once | INTRAMUSCULAR | Status: AC
  Administered 2022-08-15: 40 mg via INTRAVENOUS
  Filled 2022-08-15: qty 4

## 2022-08-15 MED ORDER — CARVEDILOL 3.125 MG PO TABS: 3.1250 mg | ORAL_TABLET | Freq: Two times a day (BID) | ORAL | 0 refills | Status: DC

## 2022-08-15 MED ORDER — ACETAMINOPHEN 500 MG PO TABS: 500.0000 mg | ORAL_TABLET | Freq: Three times a day (TID) | ORAL | 0 refills | Status: DC | PRN

## 2022-08-15 MED ORDER — ATORVASTATIN CALCIUM 40 MG PO TABS: 40.0000 mg | ORAL_TABLET | Freq: Every morning | ORAL | 0 refills | Status: DC

## 2022-08-15 MED ORDER — CARVEDILOL 3.125 MG PO TABS: 3.1250 mg | ORAL_TABLET | Freq: Two times a day (BID) | ORAL | Status: DC

## 2022-08-15 MED ORDER — ISOSORBIDE MONONITRATE ER 30 MG PO TB24: 15.0000 mg | ORAL_TABLET | Freq: Every day | ORAL | 0 refills | Status: DC

## 2022-08-15 NOTE — Discharge Summary (Addendum)
Caleb [name]:[id number] DOB: 1950/08/29 DOA: 08/11/2022  PCP: Hoy Register, MD  Admit date: 08/11/2022  Discharge date: 08/15/2022  Admitted From: Home   Disposition:  Home   Recommendations for Outpatient Follow-up:   Follow up with PCP in 1-2 weeks  PCP Please obtain BMP/CBC, 2 view CXR in 1week,  (see Discharge instructions)   PCP Please follow up on the following pending results: Follow-up blood pressure, CBC, BMP, diuretic dose and CBGs closely.  He must follow-up with orthopedics and ID within a week of discharge.   Home Health: PT, OT if qualifies   Equipment/Devices: as below  Consultations: Ortho, ID over Phone Discharge Condition: Stable    CODE STATUS: Full    Diet Recommendation: Heart Healthy low carbohydrate, 1.5 L fluid restriction strict.   Chief Complaint  Patient presents with   Hypotension   Toe Pain     Brief history of present illness from the day of admission and additional interim summary    72 y.o. male with medical history significant of osteomyelitis of his right foot status post resection was on Bactrim for residual infection and due for outpatient BKA by Dr. Lajoyce Corners coming week, PVD status post stents with chronic wound, DM2, HTN, HLD, systolic CHF EF 25-30, aortic stenosis status post TAVR 2023, presented to the hospital with nausea vomiting and low blood pressure was found to have dehydration, hyponatremia along with AKI. Of note he was recently started on Bactrim for his foot infection. He was admitted to the hospital for treatment of dehydration, AKI.                                                                  Hospital Course   AKI superimposed on CKD stage IIIa, hyponatremia, dehydration due to nausea vomiting, Bactrim use, home diuretic and Entresto use. He is being  hydrated with IV fluids, Bactrim and diuretics along with Entresto on hold.  Baseline creatinine around 1.6.  Renal function has now improved, blood pressure still soft hence Entresto dose dropped, Lasix dose changed as well, he also had some element of SIADH.  Now sodium levels much improved after a single dose of Samsca on 08/14/2022, will request PCP to kindly monitor blood pressure medications, fluid status, diuretic dose closely.   Chronic osteomyelitis of the left foot s/p amputation of left fourth and fifth toes as well as right partial calcaneal excision, present on admission.  Was on Bactrim now switched to Unasyn/Augmentin after discussions with ID physician Dr. Gwynn Burly on 08/12/2022, he follows with Dr. Lajoyce Corners, post discharge follow-up with Dr. Lajoyce Corners for definitive amputation versus medical management.  He currently wants to be discharged home and follow-up with PCP, ID and Dr. Lajoyce Corners in the next 1 week, he has been clearly instructed to follow-up with  ID and Dr. Lajoyce Corners within a week.  For now he will be placed on 2 weeks of oral Augmentin with close outpatient follow-up with PCP, ID and orthopedics.   Chronic systolic heart failure EF 25%.  Currently dehydrated.  Beta-blocker and Imdur to be continued, Entresto on hold due to AKI and low blood pressure, beta-blocker dose also had to be reduced due to borderline blood pressures along with AKI.  PCP to monitor blood pressure medications, fluid status and diuretic dose closely.   PAD. Has undergone right femoral-anterior tibial artery bypass with superficial endarterectomy of the common femoral artery back in 05/2022 by Dr. Sherral Hammers  On aspirin, statin and Eliquis now for secondary prevention.  Follows with Dr. Sherral Hammers.     Dyslipidemia.  On statin.   Hyperkalemia.  Received Lokelma x 2 today, Lasix, PCP to monitor.    History of aortic stenosis s/p TAVR.  No acute issues continue to monitor, avoid excessive drop in preload.   DM type II.  Home  Rx  Lab Results  Component Value Date   HGBA1C 7.9 (H) 08/11/2022    Discharge diagnosis     Principal Problem:   AKI (acute kidney injury) (HCC) Active Problems:   Chronic systolic CHF (congestive heart failure) (HCC)   Nausea and vomiting   Acute kidney injury superimposed on chronic kidney disease (HCC)   DM2 (diabetes mellitus, type 2) (HCC)   Stage III chronic kidney disease (HCC)   Hyponatremia   Dyslipidemia   Chronic osteomyelitis of left foot (HCC)   Myoclonus   Osteomyelitis (HCC)   Ganglion of right ankle and foot    Discharge instructions    Discharge Instructions     Discharge instructions   Complete by: As directed    Dressing change in 2 days at Dr. Audrie Lia office, wear the protective boot at all times.  Must follow-up with Dr. Lajoyce Corners in 2 days and infectious disease physician within a week.  Follow with Primary MD Hoy Register, MD in 2-3 days, follow-up with Dr. Lajoyce Corners within the next 2 to 3 days as well.  Get blood pressure, CBC, CMP, magnesium,-  checked next visit with your primary MD, get your blood pressure medications and diuretic dose reviewed and adjusted that visit.  Activity: As tolerated with Full fall precautions use walker/cane & assistance as needed, wear the protective CAM boot given to you instructed by Dr. Lajoyce Corners.  Disposition Home     Diet: Heart Healthy low carbohydrate diet.  Check CBGs q. ACH S.  1.5 L fluid restriction per day.  Strict.    Check your Weight same time everyday, if you gain over 2 pounds, or you develop in leg swelling, experience more shortness of breath or chest pain, call your Primary MD immediately. Follow Cardiac Low Salt Diet and 1.5 lit/day fluid restriction.  Special Instructions: If you have smoked or chewed Tobacco  in the last 2 yrs please stop smoking, stop any regular Alcohol  and or any Recreational drug use.  On your next visit with your primary care physician please Get Medicines reviewed and  adjusted.  Please request your Prim.MD to go over all Hospital Tests and Procedure/Radiological results at the follow up, please get all Hospital records sent to your Prim MD by signing hospital release before you go home.  If you experience worsening of your admission symptoms, develop shortness of breath, life threatening emergency, suicidal or homicidal thoughts you must seek medical attention immediately by calling 911 or calling your  MD immediately  if symptoms less severe.  You Must read complete instructions/literature along with all the possible adverse reactions/side effects for all the Medicines you take and that have been prescribed to you. Take any new Medicines after you have completely understood and accpet all the possible adverse reactions/side effects.   Discharge wound care:   Complete by: As directed    Dressing change in 2 days at Dr. Audrie Lia office, wear the protective boot at all times.  Must follow-up with Dr. Lajoyce Corners in 2 days and infectious disease physician within a week.   Increase activity slowly   Complete by: As directed        Discharge Medications   Allergies as of 08/15/2022       Reactions   Codeine Nausea And Vomiting   Percocet [oxycodone-acetaminophen] Nausea And Vomiting   Tramadol Hcl Nausea And Vomiting        Medication List     STOP taking these medications    Entresto 49-51 MG Generic drug: sacubitril-valsartan   gabapentin 800 MG tablet Commonly known as: NEURONTIN Replaced by: gabapentin 300 MG capsule   HYDROcodone-acetaminophen 5-325 MG tablet Commonly known as: NORCO/VICODIN   oxyCODONE-acetaminophen 5-325 MG tablet Commonly known as: PERCOCET/ROXICET   sulfamethoxazole-trimethoprim 800-160 MG tablet Commonly known as: BACTRIM DS       TAKE these medications    acetaminophen 500 MG tablet Commonly known as: TYLENOL Take 1 tablet (500 mg total) by mouth every 8 (eight) hours as needed for moderate pain.    amoxicillin-clavulanate 500-125 MG tablet Commonly known as: AUGMENTIN Take 1 tablet by mouth 2 (two) times daily.   Artificial Tears PF 0.1-0.3 % Soln Generic drug: Dextran 70-Hypromellose (PF) Place 1 drop into both eyes every 8 (eight) hours as needed (for dryness).   aspirin EC 81 MG tablet Take 81 mg by mouth in the morning.   atorvastatin 40 MG tablet Commonly known as: LIPITOR Take 1 tablet (40 mg total) by mouth in the morning.   carvedilol 3.125 MG tablet Commonly known as: COREG Take 1 tablet (3.125 mg total) by mouth 2 (two) times daily with a meal. Start taking on: Aug 16, 2022 What changed:  medication strength how much to take   cyanocobalamin 500 MCG tablet Commonly known as: VITAMIN B12 Take 500 mcg by mouth in the morning.   Eliquis 5 MG Tabs tablet Generic drug: apixaban Take 1 tablet (5 mg total) by mouth 2 (two) times daily.   ferrous sulfate 325 (65 FE) MG tablet Take 325 mg by mouth daily with breakfast.   FreeStyle Libre 14 Day Sensor Misc Inject 1 Device into the skin every 14 (fourteen) days.   furosemide 80 MG tablet Commonly known as: LASIX Take 1 tablet (80 mg total) by mouth daily. And 1 tablet (40 mg) in the evening What changed:  when to take this Another medication with the same name was removed. Continue taking this medication, and follow the directions you see here.   gabapentin 300 MG capsule Commonly known as: NEURONTIN Take 1 capsule (300 mg total) by mouth 3 (three) times daily. Replaces: gabapentin 800 MG tablet   hydrALAZINE 10 MG tablet Commonly known as: APRESOLINE Take 1 tablet (10 mg total) by mouth 3 (three) times daily.   insulin NPH-regular Human (70-30) 100 UNIT/ML injection Inject 7-8 Units into the skin See admin instructions. Inject 8 units into the skin before breakfast and 7 units before the evening meal   isosorbide mononitrate 30 MG  24 hr tablet Commonly known as: IMDUR Take 0.5 tablets (15 mg total) by  mouth daily. What changed: how much to take   NON FORMULARY Take 16 oz by mouth See admin instructions. [employer] Signature Organic Raw Kombucha, Ginger Lemonade- Drink 16 ounces by mouth once a day   ondansetron 4 MG tablet Commonly known as: Zofran Take 1 tablet (4 mg total) by mouth every 8 (eight) hours as needed for nausea or vomiting.   polyethylene glycol 17 g packet Commonly known as: MIRALAX / GLYCOLAX Take 17 g by mouth daily. What changed:  when to take this reasons to take this   PRESCRIPTION MEDICATION Inject 0.1-1 mLs as directed See admin instructions. Tri-Mix Standard Strength 5 ml. Formula: Prostaglandin 10 mcg/ml, Papaverine 30 mg/ml, Phentolamine 1 mg/ml. Inject 0.1 ml into side of penis as directed as needed (to be injected immediately before sexual intercourse) may increase the dose by 0.1 ml every 48 hours to achieve an erection. Max dose 1 ml.   Vitamin D-3 25 MCG (1000 UT) Caps Take 1,000 Units by mouth daily.               Discharge Care Instructions  (From admission, onward)           Start     Ordered   08/15/22 0000  Discharge wound care:       Comments: Dressing change in 2 days at Dr. Audrie Lia office, wear the protective boot at all times.  Must follow-up with Dr. Lajoyce Corners in 2 days and infectious disease physician within a week.   08/15/22 0746             Follow-up Information     [facility] Follow up.   Specialty: Home Health Services Why: [facility] will provide your Home Health PT and OT and will contact you within 48 hours of your discharge Contact information: [address] [place] [address] [phone]         Hoy Register, MD. Schedule an appointment as soon as possible for a visit in 2 day(s).   Specialty: Family Medicine Contact information: [address] [place] [address] [place] [address] [phone]         Nadara Mustard, MD. Schedule an appointment as soon as possible for a  visit in 3 day(s).   Specialty: Orthopedic Surgery Contact information: [address] [place] [address] [phone]         Odette Fraction, MD. Schedule an appointment as soon as possible for a visit in 1 week(s).   Specialty: Infectious Diseases Contact information: [address] [employer] [address] [place] [address] [phone]                 Major procedures and Radiology Reports - PLEASE review detailed and final reports thoroughly  -      DG Chest [facility] 1 View  Result Date: 08/15/2022 CLINICAL DATA:  72 year old male with history of shortness of breath. EXAM: PORTABLE CHEST 1 VIEW COMPARISON:  Chest x-ray 08/02/2022. FINDINGS: Lung volumes are normal. No consolidative airspace disease. No pleural effusions. No pneumothorax. No pulmonary nodule or mass noted. Pulmonary vasculature and the cardiomediastinal silhouette are within normal limits. Status post TAVR. IMPRESSION: 1.  No radiographic evidence of acute cardiopulmonary disease. 2. Status post TAVR. Electronically Signed   By: Trudie Reed M.D.   On: 08/15/2022 06:47   DG Abd Portable 1V  Result Date: 08/11/2022 CLINICAL DATA:  Vomiting EXAM: PORTABLE  ABDOMEN - 1 VIEW COMPARISON:  CT abdomen and pelvis 08/03/2022 FINDINGS: The bowel gas pattern is normal. No radio-opaque calculi or other significant radiographic abnormality are seen. IMPRESSION: Negative. Electronically Signed   By: Minerva Fester M.D.   On: 08/11/2022 18:44   CT ABDOMEN PELVIS W CONTRAST  Result Date: 08/03/2022 CLINICAL DATA:  Abdominal pain. EXAM: CT ABDOMEN AND PELVIS WITH CONTRAST TECHNIQUE: Multidetector CT imaging of the abdomen and pelvis was performed using the standard protocol following bolus administration of intravenous contrast. RADIATION DOSE REDUCTION: This exam was performed according to the departmental dose-optimization program which includes automated exposure control, adjustment of the mA and/or kV according to  patient size and/or use of iterative reconstruction technique. CONTRAST:  75mL OMNIPAQUE IOHEXOL 350 MG/ML SOLN COMPARISON:  May 27, 2020 FINDINGS: Lower chest: An artificial aortic valve is seen. Hepatobiliary: No focal liver abnormality is seen. No gallstones, gallbladder wall thickening, or biliary dilatation. Pancreas: Unremarkable. No pancreatic ductal dilatation or surrounding inflammatory changes. Spleen: Normal in size without focal abnormality. Adrenals/Urinary Tract: Adrenal glands are unremarkable. Kidneys are normal, without renal calculi, focal lesion, or hydronephrosis. Bladder is unremarkable. Stomach/Bowel: The stomach is poorly distended. Mild thickening of the gastric antrum is noted. The appendix appears normal. Stool is seen throughout the large bowel. No evidence of bowel wall thickening, distention, or inflammatory changes. Vascular/Lymphatic: Aortic atherosclerosis. No enlarged abdominal or pelvic lymph nodes. Reproductive: The prostate gland is mildly enlarged. Other: No abdominal wall hernia or abnormality. No abdominopelvic ascites. Musculoskeletal: Stable, increased trabeculation is seen within the pelvis, sacrum and coccyx. No acute osseous abnormalities are identified. IMPRESSION: 1. Mild thickening of the gastric antrum which may be, in part, related to poor gastric distension. Mild gastritis cannot be excluded. 2. Stable, increased trabeculation within the pelvis, sacrum and coccyx, likely representing Paget's disease. 3. Aortic atherosclerosis. 4. Prostatomegaly. Aortic Atherosclerosis (ICD10-I70.0). Electronically Signed   By: Aram Candela M.D.   On: 08/03/2022 03:14   CT Head Wo Contrast  Result Date: 08/03/2022 CLINICAL DATA:  Altered mental status. EXAM: CT HEAD WITHOUT CONTRAST TECHNIQUE: Contiguous axial images were obtained from the base of the skull through the vertex without intravenous contrast. RADIATION DOSE REDUCTION: This exam was performed according to the  departmental dose-optimization program which includes automated exposure control, adjustment of the mA and/or kV according to patient size and/or use of iterative reconstruction technique. COMPARISON:  November 13, 2019 FINDINGS: Brain: There is mild cerebral atrophy with widening of the extra-axial spaces and ventricular dilatation. There are areas of decreased attenuation within the white matter tracts of the supratentorial brain, consistent with microvascular disease changes. Vascular: No hyperdense vessel or unexpected calcification. Skull: Normal. Negative for fracture or focal lesion. Sinuses/Orbits: No acute finding. Other: None. IMPRESSION: 1. No acute intracranial abnormality. 2. Generalized cerebral atrophy and microvascular disease changes of the supratentorial brain. Electronically Signed   By: Aram Candela M.D.   On: 08/03/2022 03:09   DG ABD ACUTE 2+V W 1V CHEST  Result Date: 08/03/2022 CLINICAL DATA:  Vomiting EXAM: DG ABDOMEN ACUTE WITH 1 VIEW CHEST COMPARISON:  Chest x-ray 05/27/2022 FINDINGS: The heart is enlarged, patient is status post TAVR. The lungs and costophrenic angles are clear. There is no pneumothorax. Bowel-gas pattern is nonobstructive. There is average stool burden. Multiple wires overlie the abdomen. There are no suspicious calcifications. No acute fractures are seen. IMPRESSION: 1. Cardiomegaly without edema or consolidation. 2. Nonobstructive bowel gas pattern. Average stool burden. Electronically Signed   By: Linton Rump  Malena Edman M.D.   On: 08/03/2022 00:01    Micro Results    Recent Results (from the past 240 hour(s))  MRSA Next Gen by PCR, Nasal     Status: None   Collection Time: 08/11/22 11:00 PM   Specimen: Nasal Mucosa; Nasal Swab  Result Value Ref Range Status   MRSA by PCR Next Gen NOT DETECTED NOT DETECTED Final    Comment: (NOTE) The GeneXpert MRSA Assay (FDA approved for NASAL specimens only), is one component of a comprehensive MRSA colonization  surveillance program. It is not intended to diagnose MRSA infection nor to guide or monitor treatment for MRSA infections. Test performance is not FDA approved in patients less than 70 years old. Performed at [facility], [address], [place], [place] [address]     Today   Subjective    Kebron Sassone today has no headache,no chest abdominal pain,no new weakness tingling or numbness, feels much better wants to go home today.    Objective   Blood pressure 109/72, pulse 86, temperature 98.6 F (37 C), temperature source Oral, resp. rate 19, height 5\' 7"  (1.702 m), weight 80 kg, SpO2 97 %.   Intake/Output Summary (Last 24 hours) at 08/15/2022 0751 Last data filed at 08/15/2022 0408 Gross per 24 hour  Intake --  Output 3700 ml  Net -3700 ml    Exam  Awake Alert, No new F.N deficits,    [place].AT,PERRAL Supple Neck,   Symmetrical Chest wall movement, Good air movement bilaterally, CTAB RRR,No Gallops,   +ve B.Sounds, Abd Soft, Non tender,  No Cyanosis, right foot under bandage with CAM boot   Data Review   Recent Labs  Lab 08/11/22 1610 08/12/22 0803 08/13/22 0232 08/14/22 0422 08/15/22 0336  WBC 12.3* 10.6* 10.5 11.2* 11.6*  HGB 8.4* 9.3* 8.3* 8.0* 7.7*  HCT 25.8* 29.0* 25.9* 25.0* 24.6*  PLT 487* 496* 484* 432* 458*  MCV 83.5 81.7 81.7 82.0 82.3  MCH 27.2 26.2 26.2 26.2 25.8*  MCHC 32.6 32.1 32.0 32.0 31.3  RDW 15.9* 16.0* 15.9* 15.9* 16.2*  LYMPHSABS 1.6  --  1.9 1.4 1.8  MONOABS 1.1*  --  1.4* 1.0 1.2*  EOSABS 0.0  --  0.2 0.3 0.4  BASOSABS 0.0  --  0.0 0.0 0.0    Recent Labs  Lab 08/11/22 1610 08/11/22 1745 08/11/22 2211 08/11/22 2214 08/12/22 0803 08/12/22 0808 08/13/22 0232 08/14/22 0422 08/14/22 0445 08/15/22 0336  NA 127*  --  127*  --  128*  --  129* 127*  --  133*  K 4.6  --  5.1  --  4.7  --  4.8 5.0  --  5.4*  CL 92*  --  91*  --  93*  --  95* 94*  --  98  CO2 25  --  24  --  20*  --  24 23  --  25  ANIONGAP 10  --  12  --  15   --  10 10  --  10  GLUCOSE 144*  --  145*  --  130*  --  145* 182*  --  144*  BUN 44*  --  45*  --  44*  --  40* 41*  --  35*  CREATININE 2.60*  --  2.58*  --  2.27*  --  2.05* 1.77*  --  1.47*  AST 21  --   --   --  27  --   --   --  33  --   ALT 16  --   --   --  19  --   --   --  24  --   ALKPHOS 81  --   --   --  84  --   --   --  105  --   BILITOT 0.6  --   --   --  0.5  --   --   --  0.5  --   ALBUMIN 2.5*  --   --   --  2.5*  --   --   --  2.3*  --   CRP  --   --   --  27.7*  --  27.3* 26.2* 23.6*  --  26.5*  PROCALCITON  --   --   --   --   --  0.52 0.47 0.28  --  0.26  LATICACIDVEN 1.1 1.0  --   --   --   --   --   --   --   --   INR  --   --   --   --   --   --  1.3*  --   --   --   TSH  --   --   --  0.763  --   --   --   --   --   --   HGBA1C 7.9*  --   --   --   --   --   --   --   --   --   BNP  --   --   --   --   --  1,137.8* 1,032.8* 951.5*  --  1,449.0*  MG 2.0  --   --   --  2.6*  --  2.5* 2.2  --  2.1  CALCIUM 8.2*  --  8.9  --  8.5*  --  8.4* 8.1*  --  8.5*    Total Time in preparing paper work, data evaluation and todays exam - 35 minutes  Signature  -    Susa Raring M.D on 08/15/2022 at 7:51 AM   -  To page go to [email or web address]

## 2022-08-15 NOTE — TOC Transition Note (Signed)
Transition of Care [facility]) - CM/SW Discharge Note   Patient Details  Name: Caleb [name] MRN: [id number] Date of Birth: 11-22-1950  Transition [facility] Care [facility]) CM/SW Contact:  Tom-Remus, Hershal Coria, RN Phone Number: 08/15/2022, 11:31 AM   Clinical Narrative:     Patient is scheduled for discharge today.  Readmission Risk Assessment done. Home health info, Outpatient f/u, hospital f/u and discharge instructions on AVS. Cousin, Mindi Junker to transport at discharge.  No further TOC needs noted.       Final next level of care: Home w Home Health Services Barriers to Discharge: Barriers Resolved   Patient Goals and CMS Choice CMS Medicare.gov Compare Post Acute Care list provided to:: Patient Choice offered to / list presented to : Patient  Discharge Placement                  Patient to be transferred to facility by: Cousin Name of family member notified: Mar[place]e [place]    Discharge Plan and Services Additional resources added to the After Visit Summary for                  DME Arranged: N/A DME Agency: NA       HH Arranged: PT, OT          Social Determinants of Health (SDOH) Interventions SDOH Screenings   Food Insecurity: No Food Insecurity (08/12/2022)  Housing: Low Risk  (08/12/2022)  Transportation Needs: No Transportation Needs (08/12/2022)  Utilities: Not At Risk (08/12/2022)  Alcohol Screen: Low Risk  (10/29/2021)  Depression (PHQ2-9): Low Risk  (05/02/2022)  Financial Resource Strain: Low Risk  (10/29/2021)  Physical Activity: Sufficiently Active (12/06/2020)  Social Connections: Moderately Isolated (12/06/2020)  Stress: No Stress Concern Present (12/06/2020)  Tobacco Use: Low Risk  (08/11/2022)     Readmission Risk Interventions    08/15/2022   11:30 AM  Readmission Risk Prevention Plan  Transportation Screening Complete  Medication Review (RN Care Manager) Referral to Pharmacy  PCP or Specialist appointment within 3-5 days of discharge  Complete  HRI or Home Care Consult Complete  Palliative Care Screening Not Applicable  Skilled Nursing Facility Not Applicable

## 2022-08-15 NOTE — Progress Notes (Signed)
Occupational Therapy Treatment and Discharge Patient Details Name: Caleb [name] MRN: [id number] DOB: [birth year] Today's Date: 08/15/2022   History of present illness 72 y/o M admitted to [facility] on 5/23 for nausea and vomiting and hypotension, found to have dehydration, hyponatremia along with AKI. PMHx: osteomyelitis of his R foot s/p resection and due for outpatient BKA by Dr. Lajoyce Corners this coming week, PVD s/p stents with chronic wound, DM, HTN, HLD, systolic CHF EF 25-30, aortic stenosis s/p TAVR 2023.   OT comments  This 72 yo male seen today to make there were no needs from OT standpoint that we needed to address since pt is to D/C home today. Pt overall is at a Mod I level for bed mobility (increased time), sit<>stand with RW NWB'ing on RLE, swivel/pivot on RLE for transfers maintaining NWB'ing, can remove one hand from RW while standing for LB clothing management safely, and plans on only using RW for transfers--otherwise he will be getting around in his wheelchair that fits throughout every room in his home. No further acute OT needs, we will sign off due to patient D/C'ing home today.   Recommendations for follow up therapy are one component of a multi-disciplinary discharge planning process, led by the attending physician.  Recommendations may be updated based on patient status, additional functional criteria and insurance authorization.    Assistance Recommended at Discharge PRN  Patient can return home with the following  Assistance with cooking/housework;Assist for transportation;Help with stairs or ramp for entrance   Equipment Recommendations  None recommended by OT       Precautions / Restrictions Precautions Precautions: Fall Required Braces or Orthoses: Other Brace Other Brace: PRAFO R foot Restrictions Weight Bearing Restrictions: Yes RLE Weight Bearing: Non weight bearing Other Position/Activity Restrictions: Per chart review from last admission, PA note 5/16 may be minimal  touchdown weightbearing on R foot, Dr. Lajoyce Corners states "minimize WB". Pt reports toe touch with PRAFO at home but often NWB due to pain       Mobility Bed Mobility Overal bed mobility: Modified Independent                  Transfers Overall transfer level: Modified independent Equipment used: Rolling walker (2 wheels) Transfers: Sit to/from Stand, Bed to chair/wheelchair/BSC Sit to Stand: Modified independent (Device/Increase time)                 Balance Overall balance assessment: Modified Independent Sitting-balance support: No upper extremity supported, Feet supported Sitting balance-Leahy Scale: Good     Standing balance support: During functional activity, Single extremity supported, Bilateral upper extremity supported Standing balance-Leahy Scale: Poor Standing balance comment: reliant on RW for balance                           ADL either performed or assessed with clinical judgement   ADL                                         General ADL Comments: Pt reports that he mainly gets around in his manual wheelchair with only using RW for stand pivots (not putting weight on his RLE)--he demonstrated this to me at a Mod I level by standing at EOB and then shifting up to [facility] pivoting on his LLE. He was also able do doff and donn his left PRAFO (I did  modifyy by adding some hyperfix tape to the end of each strap to make it longer for him to grab due to the PN in his fingers.    Extremity/Trunk Assessment Upper Extremity Assessment Upper Extremity Assessment: Overall WFL for tasks assessed            Vision Baseline Vision/History: 1 Wears glasses Ability to See in Adequate Light: 0 Adequate Patient Visual Report: No change from baseline            Cognition Arousal/Alertness: Awake/alert Behavior During Therapy: WFL for tasks assessed/performed Overall Cognitive Status: Within Functional Limits for tasks assessed                                  General Comments: AAOx4. Pleasant throughout session.                   Pertinent Vitals/ Pain       Pain Assessment Pain Assessment: Faces Faces Pain Scale: Hurts even more Pain Location: R foot with mobility Pain Descriptors / Indicators: Sore, Grimacing, Guarding, Throbbing Pain Intervention(s): Limited activity within patient's tolerance, Monitored during session, Repositioned         Frequency  Min 1X/week        Progress Toward Goals  OT Goals(current goals can now be found in the care plan section)  Progress towards OT goals: Goals met/education completed, patient discharged from OT  Acute Rehab OT Goals Patient Stated Goal: to go home today and maybe back for surgery on Friday OT Goal Formulation: With patient Time For Goal Achievement: 08/26/22 Potential to Achieve Goals: Good  Plan Frequency needs to be updated;Discharge plan remains appropriate       AM-PAC OT "6 Clicks" Daily Activity     Outcome Measure   Help from another person eating meals?: None Help from another person taking care of personal grooming?: None Help from another person toileting, which includes using toliet, bedpan, or urinal?: None Help from another person bathing (including washing, rinsing, drying)?: None Help from another person to put on and taking off regular upper body clothing?: None Help from another person to put on and taking off regular lower body clothing?: None 6 Click Score: 24    End of Session Equipment Utilized During Treatment: Rolling walker (2 wheels)  OT Visit Diagnosis: Unsteadiness on feet (R26.81);Muscle weakness (generalized) (M62.81);Other abnormalities of gait and mobility (R26.89)   Activity Tolerance Patient tolerated treatment well   Patient Left in bed;with call bell/phone within reach;with bed alarm set           Time: 1610-9604 OT Time Calculation (min): 27 min  Charges: OT General Charges $OT Visit:  1 Visit OT Treatments $Self Care/Home Management : 23-37 mins  Lindon Romp OT Acute Rehabilitation Services Office [phone]   Evette Georges 08/15/2022, 12:48 PM

## 2022-08-15 NOTE — Progress Notes (Signed)
Physical Therapy Treatment Patient Details Name: Caleb [name] MRN: [id number] DOB: 06-27-1950 Today's Date: 08/15/2022   History of Present Illness 72 y/o M admitted to [facility] on 5/23 for nausea and vomiting and hypotension, found to have dehydration, hyponatremia along with AKI. PMHx: osteomyelitis of his R foot s/p resection and due for outpatient BKA by Dr. Lajoyce Corners this coming week, PVD s/p stents with chronic wound, DM, HTN, HLD, systolic CHF EF 25-30, aortic stenosis s/p TAVR 2023.    PT Comments    Pt tolerated today's session well, eager to get to chair to eat his breakfast. Pt requesting bed to be elevated prior to standing, reporting similar height to his bed at home. Pt able to stand with minG but required minA for transfer and brief ambulation trial to the chair for balance and for safe and controlled descent into the chair. Pt educated on HEP to perform at home to prepare for potential BKA this week and to maximize LLE strength, pt able to repeat back exercises and declined any questions or concerns. Acute PT will continue to follow pt during admission to progress mobility and address deficits, discharge recommendations remain appropriate.     Recommendations for follow up therapy are one component of a multi-disciplinary discharge planning process, led by the attending physician.  Recommendations may be updated based on patient status, additional functional criteria and insurance authorization.  Follow Up Recommendations  Can patient physically be transported by private vehicle: Yes    Assistance Recommended at Discharge Frequent or constant Supervision/Assistance  Patient can return home with the following A little help with walking and/or transfers;A little help with bathing/dressing/bathroom;Assistance with cooking/housework;Assist for transportation;Help with stairs or ramp for entrance   Equipment Recommendations  None recommended by PT    Recommendations for Other Services        Precautions / Restrictions Precautions Precautions: Fall Required Braces or Orthoses: Other Brace Other Brace: PRAFO R foot Restrictions Weight Bearing Restrictions: Yes RLE Weight Bearing: Non weight bearing Other Position/Activity Restrictions: Per chart review from last admission, PA note 5/16 may be minimal touchdown weightbearing on R foot, Dr. Lajoyce Corners states "minimize WB". Pt reports toe touch with PRAFO at home but often NWB due to pain     Mobility  Bed Mobility Overal bed mobility: Needs Assistance Bed Mobility: Supine to Sit     Supine to sit: Supervision, HOB elevated     General bed mobility comments: use of bed rails but pivoting to EOB and able to scoot hips with supervision for safety and line management    Transfers Overall transfer level: Needs assistance Equipment used: Rolling walker (2 wheels) Transfers: Sit to/from Stand, Bed to chair/wheelchair/BSC Sit to Stand: Min guard, Min assist, From elevated surface   Step pivot transfers: Min assist       General transfer comment: minG for balance to come to standing from elevated surface but requiring minA for controlled descent into chair. One posterior LOB during transfer with minA for correction of balance    Ambulation/Gait Ambulation/Gait assistance: Min assist Gait Distance (Feet): 4 Feet Assistive device: Rolling walker (2 wheels) Gait Pattern/deviations: Trunk flexed, Decreased step length - left, Decreased dorsiflexion - left Gait velocity: decreased     General Gait Details: hop to pattern with decreased hop length and left foot clearance, downward gaze with trunk flexion throughout. cueing provided for utilizing BUE for increased hop length and foot clearance as well as balance, requiring minA to correct one posterior LOB during transfer to  chair   Stairs             Wheelchair Mobility    Modified Rankin (Stroke Patients Only)       Balance Overall balance assessment: Needs  assistance Sitting-balance support: No upper extremity supported, Feet supported Sitting balance-Leahy Scale: Good     Standing balance support: Bilateral upper extremity supported, During functional activity, Reliant on assistive device for balance Standing balance-Leahy Scale: Poor Standing balance comment: reliant on RW                            Cognition Arousal/Alertness: Awake/alert Behavior During Therapy: WFL for tasks assessed/performed Overall Cognitive Status: Within Functional Limits for tasks assessed                                          Exercises Other Exercises Other Exercises: Pt educated on HEP to perform at home to maximize strength of LLE prior to potential BKA surgery    General Comments General comments (skin integrity, edema, etc.): VSS on room air      Pertinent Vitals/Pain Pain Assessment Pain Assessment: Faces Faces Pain Scale: Hurts little more Pain Location: R foot Pain Descriptors / Indicators: Discomfort, Grimacing Pain Intervention(s): Limited activity within patient's tolerance, Monitored during session, Repositioned    Home Living                          Prior Function            PT Goals (current goals can now be found in the care plan section) Acute Rehab PT Goals Patient Stated Goal: get a second opinion on his surgery PT Goal Formulation: With patient Time For Goal Achievement: 08/26/22 Potential to Achieve Goals: Good Progress towards PT goals: Progressing toward goals    Frequency    Min 3X/week      PT Plan Current plan remains appropriate    Co-evaluation              AM-PAC PT "6 Clicks" Mobility   Outcome Measure  Help needed turning from your back to your side while in a flat bed without using bedrails?: A Little Help needed moving from lying on your back to sitting on the side of a flat bed without using bedrails?: A Little Help needed moving to and from a bed  to a chair (including a wheelchair)?: A Little Help needed standing up from a chair using your arms (e.g., wheelchair or bedside chair)?: A Little Help needed to walk in hospital room?: A Lot Help needed climbing 3-5 steps with a railing? : A Lot 6 Click Score: 16    End of Session Equipment Utilized During Treatment: Gait belt Activity Tolerance: Patient tolerated treatment well Patient left: in chair;with call bell/phone within reach;with chair alarm set Nurse Communication: Mobility status PT Visit Diagnosis: Difficulty in walking, not elsewhere classified (R26.2);Other abnormalities of gait and mobility (R26.89);Pain Pain - Right/Left: Right Pain - part of body: Ankle and joints of foot     Time: 1610-9604 PT Time Calculation (min) (ACUTE ONLY): 10 min  Charges:  $Therapeutic Activity: 8-22 mins                     Lindalou Hose, PT DPT Acute Rehabilitation Services Office [phone]    Maralyn Sago  Edison Pace 08/15/2022, 1:16 PM

## 2022-08-15 NOTE — Discharge Instructions (Addendum)
Dressing change in 2 days at Dr. Audrie Lia office, wear the protective boot at all times.  Must follow-up with Dr. Lajoyce Corners in 2 days and infectious disease physician within a week.  Follow with Primary MD Hoy Register, MD in 2-3 days, follow-up with Dr. Lajoyce Corners within the next 2 to 3 days as well.  Get blood pressure, CBC, CMP, magnesium,-  checked next visit with your primary MD, get your blood pressure medications and diuretic dose reviewed and adjusted that visit.  Activity: As tolerated with Full fall precautions use walker/cane & assistance as needed, wear the protective CAM boot given to you instructed by Dr. Lajoyce Corners.  Disposition Home     Diet: Heart Healthy low carbohydrate diet.  Check CBGs q. ACH S.  1.5 L fluid restriction per day.  Strict.    Check your Weight same time everyday, if you gain over 2 pounds, or you develop in leg swelling, experience more shortness of breath or chest pain, call your Primary MD immediately. Follow Cardiac Low Salt Diet and 1.5 lit/day fluid restriction.  Special Instructions: If you have smoked or chewed Tobacco  in the last 2 yrs please stop smoking, stop any regular Alcohol  and or any Recreational drug use.  On your next visit with your primary care physician please Get Medicines reviewed and adjusted.  Please request your Prim.MD to go over all Hospital Tests and Procedure/Radiological results at the follow up, please get all Hospital records sent to your Prim MD by signing hospital release before you go home.  If you experience worsening of your admission symptoms, develop shortness of breath, life threatening emergency, suicidal or homicidal thoughts you must seek medical attention immediately by calling 911 or calling your MD immediately  if symptoms less severe.  You Must read complete instructions/literature along with all the possible adverse reactions/side effects for all the Medicines you take and that have been prescribed to you. Take any new  Medicines after you have completely understood and accpet all the possible adverse reactions/side effects.

## 2022-08-16 ENCOUNTER — Inpatient Hospital Stay: Payer: No Typology Code available for payment source | Admitting: Family Medicine

## 2022-08-16 DIAGNOSIS — I5022 Chronic systolic (congestive) heart failure: Secondary | ICD-10-CM | POA: Diagnosis not present

## 2022-08-16 DIAGNOSIS — L97419 Non-pressure chronic ulcer of right heel and midfoot with unspecified severity: Secondary | ICD-10-CM | POA: Diagnosis not present

## 2022-08-16 DIAGNOSIS — E1169 Type 2 diabetes mellitus with other specified complication: Secondary | ICD-10-CM | POA: Diagnosis not present

## 2022-08-16 DIAGNOSIS — M79671 Pain in right foot: Secondary | ICD-10-CM | POA: Diagnosis not present

## 2022-08-16 DIAGNOSIS — I130 Hypertensive heart and chronic kidney disease with heart failure and stage 1 through stage 4 chronic kidney disease, or unspecified chronic kidney disease: Secondary | ICD-10-CM | POA: Diagnosis not present

## 2022-08-16 DIAGNOSIS — T8149XA Infection following a procedure, other surgical site, initial encounter: Secondary | ICD-10-CM | POA: Diagnosis not present

## 2022-08-16 DIAGNOSIS — A4189 Other specified sepsis: Secondary | ICD-10-CM | POA: Diagnosis not present

## 2022-08-16 DIAGNOSIS — E11621 Type 2 diabetes mellitus with foot ulcer: Secondary | ICD-10-CM | POA: Diagnosis not present

## 2022-08-16 DIAGNOSIS — B951 Streptococcus, group B, as the cause of diseases classified elsewhere: Secondary | ICD-10-CM | POA: Diagnosis not present

## 2022-08-16 DIAGNOSIS — R6889 Other general symptoms and signs: Secondary | ICD-10-CM | POA: Diagnosis not present

## 2022-08-16 DIAGNOSIS — M86171 Other acute osteomyelitis, right ankle and foot: Secondary | ICD-10-CM | POA: Diagnosis not present

## 2022-08-17 ENCOUNTER — Telehealth: Payer: Self-pay | Admitting: Orthopedic Surgery

## 2022-08-17 ENCOUNTER — Telehealth: Payer: Self-pay | Admitting: Family Medicine

## 2022-08-17 ENCOUNTER — Telehealth: Payer: Self-pay

## 2022-08-17 ENCOUNTER — Telehealth: Payer: Self-pay | Admitting: Cardiovascular Disease

## 2022-08-17 LAB — TYPE AND SCREEN
Antibody Screen: POSITIVE
DAT, IgG: NEGATIVE
Unit division: 0
Unit division: 0

## 2022-08-17 LAB — BPAM RBC
Blood Product Expiration Date: 202406122359
Unit Type and Rh: 5100
Unit Type and Rh: 5100

## 2022-08-17 NOTE — Telephone Encounter (Signed)
Pt is scheduled Friday for amputation, has been in the hospital 3 times within the last 3 weeks  Home Health Verbal Orders - Caller/Agency: Waynetta Sandy from [facility] Number: [phone] Requesting OT/PT/Skilled Nursing/Social Work/Speech Therapy: Nursing  Frequency:   Nursing 2w3 1w2

## 2022-08-17 NOTE — Telephone Encounter (Signed)
Patient in a great amount of pain; called in requesting something for pain. Nerve pain- he was decreasing the gabapentin.. can he go back up.  *The nurse from [facility] also called stating the same concerns. [phone])   Patient confused about appointment on Friday; would like clarification on that as well

## 2022-08-17 NOTE — Telephone Encounter (Signed)
Home Health Verbal Orders -given Beth from [facility]   Nursing 2w1

## 2022-08-17 NOTE — Telephone Encounter (Signed)
Spoke with Caleb [name] and informed her of Dr. Allyson Sabal order patient will continue on 80mg  of lasix in the am and 40mg  in evening per discharge instructions

## 2022-08-17 NOTE — Telephone Encounter (Signed)
Pt c/o medication issue:  1. Name of Medication: furosemide (LASIX) 80 MG tablet   2. How are you currently taking this medication (dosage and times per day)?  3. Are you having a reaction (difficulty breathing--STAT)?   4. What is your medication issue? [facility] nurse is calling to get clarification as to how this medication is to be taken. Please advise.

## 2022-08-17 NOTE — Telephone Encounter (Signed)
Left voicemail for patient to return call to office. 

## 2022-08-17 NOTE — Telephone Encounter (Signed)
Beth called regarding lasix. Patient was in the hospital and discharged on 5/27. Discharge instructions states his lasix dose was changed. In the discharge instructions it states to take 80mg  of lasix in the am and 40mg  in the pm. It also says to to follow the directions you see here. I informed beth what the discharge instructions state. She stated she read that as well but he was already taking lasix 80mg  in the am and 40mg  in the pm so there would be no change to his lasix.  She would like to know if you wanted him to change the dose of his lasix? Please advise.

## 2022-08-17 NOTE — Transitions of Care (Post Inpatient/ED Visit) (Signed)
   08/17/2022  Name: Caleb [name] MRN: [id number] DOB: January 27, 1951  Today's TOC FU Call Status: Today's TOC FU Call Status:: Unsuccessul Call (1st Attempt) Unsuccessful Call (1st Attempt) Date: 08/17/22  Attempted to reach the patient regarding the most recent Inpatient/ED visit.  Follow Up Plan: Additional outreach attempts will be made to reach the patient to complete the Transitions of Care (Post Inpatient/ED visit) call.     Antionette Fairy, RN,BSN,CCM [facility] Care Management Care Management Community Coordinator Direct Phone: [phone] Toll Free: [phone] Fax: [phone]

## 2022-08-17 NOTE — Telephone Encounter (Signed)
I called pt and he would like to proceed with BKA. He was going to have it last week and then declined bc he was inpt for BP issues and has since been d/c and would like to sch for Friday. Advised pt surgical scheduler will call to set this up today.  Also called and sw HHN to advise of the plan.

## 2022-08-18 ENCOUNTER — Emergency Department (HOSPITAL_COMMUNITY): Payer: No Typology Code available for payment source

## 2022-08-18 ENCOUNTER — Inpatient Hospital Stay (HOSPITAL_COMMUNITY)
Admission: EM | Admit: 2022-08-18 | Discharge: 2022-08-22 | DRG: 617 | Disposition: A | Discharge status: Rehabilitation Facility | Payer: No Typology Code available for payment source | Attending: Family Medicine | Admitting: Family Medicine

## 2022-08-18 ENCOUNTER — Encounter (HOSPITAL_COMMUNITY): Payer: Self-pay

## 2022-08-18 ENCOUNTER — Telehealth: Payer: Self-pay

## 2022-08-18 ENCOUNTER — Other Ambulatory Visit: Payer: Self-pay

## 2022-08-18 DIAGNOSIS — Z811 Family history of alcohol abuse and dependence: Secondary | ICD-10-CM | POA: Diagnosis not present

## 2022-08-18 DIAGNOSIS — E1122 Type 2 diabetes mellitus with diabetic chronic kidney disease: Secondary | ICD-10-CM

## 2022-08-18 DIAGNOSIS — E860 Dehydration: Secondary | ICD-10-CM | POA: Diagnosis present

## 2022-08-18 DIAGNOSIS — I10 Essential (primary) hypertension: Secondary | ICD-10-CM

## 2022-08-18 DIAGNOSIS — N183 Chronic kidney disease, stage 3 unspecified: Secondary | ICD-10-CM | POA: Diagnosis not present

## 2022-08-18 DIAGNOSIS — I5022 Chronic systolic (congestive) heart failure: Secondary | ICD-10-CM | POA: Diagnosis not present

## 2022-08-18 DIAGNOSIS — T8149XA Infection following a procedure, other surgical site, initial encounter: Secondary | ICD-10-CM | POA: Diagnosis not present

## 2022-08-18 DIAGNOSIS — L97419 Non-pressure chronic ulcer of right heel and midfoot with unspecified severity: Secondary | ICD-10-CM | POA: Diagnosis not present

## 2022-08-18 DIAGNOSIS — E1142 Type 2 diabetes mellitus with diabetic polyneuropathy: Secondary | ICD-10-CM | POA: Diagnosis present

## 2022-08-18 DIAGNOSIS — I25119 Atherosclerotic heart disease of native coronary artery with unspecified angina pectoris: Secondary | ICD-10-CM | POA: Insufficient documentation

## 2022-08-18 DIAGNOSIS — Z7982 Long term (current) use of aspirin: Secondary | ICD-10-CM

## 2022-08-18 DIAGNOSIS — S88111A Complete traumatic amputation at level between knee and ankle, right lower leg, initial encounter: Secondary | ICD-10-CM | POA: Diagnosis not present

## 2022-08-18 DIAGNOSIS — I110 Hypertensive heart disease with heart failure: Secondary | ICD-10-CM | POA: Diagnosis not present

## 2022-08-18 DIAGNOSIS — I130 Hypertensive heart and chronic kidney disease with heart failure and stage 1 through stage 4 chronic kidney disease, or unspecified chronic kidney disease: Secondary | ICD-10-CM | POA: Diagnosis present

## 2022-08-18 DIAGNOSIS — R7401 Elevation of levels of liver transaminase levels: Secondary | ICD-10-CM | POA: Insufficient documentation

## 2022-08-18 DIAGNOSIS — Z885 Allergy status to narcotic agent status: Secondary | ICD-10-CM | POA: Diagnosis not present

## 2022-08-18 DIAGNOSIS — B951 Streptococcus, group B, as the cause of diseases classified elsewhere: Secondary | ICD-10-CM | POA: Diagnosis not present

## 2022-08-18 DIAGNOSIS — G8929 Other chronic pain: Secondary | ICD-10-CM | POA: Diagnosis present

## 2022-08-18 DIAGNOSIS — E785 Hyperlipidemia, unspecified: Secondary | ICD-10-CM | POA: Diagnosis present

## 2022-08-18 DIAGNOSIS — Z7901 Long term (current) use of anticoagulants: Secondary | ICD-10-CM

## 2022-08-18 DIAGNOSIS — E782 Mixed hyperlipidemia: Secondary | ICD-10-CM | POA: Diagnosis not present

## 2022-08-18 DIAGNOSIS — M86671 Other chronic osteomyelitis, right ankle and foot: Secondary | ICD-10-CM | POA: Diagnosis present

## 2022-08-18 DIAGNOSIS — Z79899 Other long term (current) drug therapy: Secondary | ICD-10-CM

## 2022-08-18 DIAGNOSIS — D472 Monoclonal gammopathy: Secondary | ICD-10-CM | POA: Diagnosis present

## 2022-08-18 DIAGNOSIS — E7800 Pure hypercholesterolemia, unspecified: Secondary | ICD-10-CM | POA: Diagnosis present

## 2022-08-18 DIAGNOSIS — N1831 Chronic kidney disease, stage 3a: Secondary | ICD-10-CM | POA: Diagnosis present

## 2022-08-18 DIAGNOSIS — I739 Peripheral vascular disease, unspecified: Secondary | ICD-10-CM | POA: Diagnosis not present

## 2022-08-18 DIAGNOSIS — E1152 Type 2 diabetes mellitus with diabetic peripheral angiopathy with gangrene: Secondary | ICD-10-CM | POA: Diagnosis present

## 2022-08-18 DIAGNOSIS — E1169 Type 2 diabetes mellitus with other specified complication: Principal | ICD-10-CM | POA: Diagnosis present

## 2022-08-18 DIAGNOSIS — Z83719 Family history of colon polyps, unspecified: Secondary | ICD-10-CM

## 2022-08-18 DIAGNOSIS — E871 Hypo-osmolality and hyponatremia: Secondary | ICD-10-CM

## 2022-08-18 DIAGNOSIS — Z952 Presence of prosthetic heart valve: Secondary | ICD-10-CM | POA: Diagnosis not present

## 2022-08-18 DIAGNOSIS — M86171 Other acute osteomyelitis, right ankle and foot: Secondary | ICD-10-CM | POA: Diagnosis not present

## 2022-08-18 DIAGNOSIS — Z794 Long term (current) use of insulin: Secondary | ICD-10-CM

## 2022-08-18 DIAGNOSIS — I5042 Chronic combined systolic (congestive) and diastolic (congestive) heart failure: Secondary | ICD-10-CM | POA: Diagnosis present

## 2022-08-18 DIAGNOSIS — D638 Anemia in other chronic diseases classified elsewhere: Secondary | ICD-10-CM | POA: Diagnosis present

## 2022-08-18 DIAGNOSIS — Z833 Family history of diabetes mellitus: Secondary | ICD-10-CM

## 2022-08-18 DIAGNOSIS — K219 Gastro-esophageal reflux disease without esophagitis: Secondary | ICD-10-CM | POA: Diagnosis present

## 2022-08-18 DIAGNOSIS — N179 Acute kidney failure, unspecified: Secondary | ICD-10-CM | POA: Diagnosis not present

## 2022-08-18 DIAGNOSIS — Z8249 Family history of ischemic heart disease and other diseases of the circulatory system: Secondary | ICD-10-CM

## 2022-08-18 DIAGNOSIS — E11621 Type 2 diabetes mellitus with foot ulcer: Secondary | ICD-10-CM | POA: Diagnosis present

## 2022-08-18 DIAGNOSIS — E119 Type 2 diabetes mellitus without complications: Secondary | ICD-10-CM

## 2022-08-18 DIAGNOSIS — I509 Heart failure, unspecified: Secondary | ICD-10-CM | POA: Diagnosis not present

## 2022-08-18 DIAGNOSIS — M869 Osteomyelitis, unspecified: Secondary | ICD-10-CM | POA: Diagnosis not present

## 2022-08-18 DIAGNOSIS — R269 Unspecified abnormalities of gait and mobility: Secondary | ICD-10-CM | POA: Diagnosis not present

## 2022-08-18 DIAGNOSIS — T8781 Dehiscence of amputation stump: Secondary | ICD-10-CM | POA: Diagnosis not present

## 2022-08-18 DIAGNOSIS — R2689 Other abnormalities of gait and mobility: Secondary | ICD-10-CM | POA: Diagnosis not present

## 2022-08-18 DIAGNOSIS — M8660 Other chronic osteomyelitis, unspecified site: Secondary | ICD-10-CM

## 2022-08-18 DIAGNOSIS — I959 Hypotension, unspecified: Secondary | ICD-10-CM | POA: Diagnosis not present

## 2022-08-18 LAB — COMPREHENSIVE METABOLIC PANEL
ALT: 30 U/L (ref 0–44)
AST: 32 U/L (ref 15–41)
Albumin: 2.1 g/dL — ABNORMAL LOW (ref 3.5–5.0)
Alkaline Phosphatase: 110 U/L (ref 38–126)
Anion gap: 12 (ref 5–15)
BUN: 39 mg/dL — ABNORMAL HIGH (ref 8–23)
CO2: 25 mmol/L (ref 22–32)
Calcium: 8.2 mg/dL — ABNORMAL LOW (ref 8.9–10.3)
Chloride: 92 mmol/L — ABNORMAL LOW (ref 98–111)
Creatinine, Ser: 2.15 mg/dL — ABNORMAL HIGH (ref 0.61–1.24)
GFR, Estimated: 32 mL/min — ABNORMAL LOW (ref >60)
Glucose, Bld: 147 mg/dL — ABNORMAL HIGH (ref 70–99)
Potassium: 4.5 mmol/L (ref 3.5–5.1)
Sodium: 129 mmol/L — ABNORMAL LOW (ref 135–145)
Total Bilirubin: 0.5 mg/dL (ref 0.3–1.2)
Total Protein: 7.5 g/dL (ref 6.5–8.1)

## 2022-08-18 LAB — CBC WITH DIFFERENTIAL/PLATELET
Abs Immature Granulocytes: 0.03 10*3/uL (ref 0.00–0.07)
Basophils Absolute: 0 10*3/uL (ref 0.0–0.1)
Basophils Relative: 0 %
Eosinophils Absolute: 0.1 10*3/uL (ref 0.0–0.5)
Eosinophils Relative: 0 %
HCT: 24.9 % — ABNORMAL LOW (ref 39.0–52.0)
Hemoglobin: 7.9 g/dL — ABNORMAL LOW (ref 13.0–17.0)
Immature Granulocytes: 0 %
Lymphocytes Relative: 14 %
Lymphs Abs: 1.7 10*3/uL (ref 0.7–4.0)
MCH: 27 pg (ref 26.0–34.0)
MCHC: 31.7 g/dL (ref 30.0–36.0)
MCV: 85 fL (ref 80.0–100.0)
Monocytes Absolute: 1.3 10*3/uL — ABNORMAL HIGH (ref 0.1–1.0)
Monocytes Relative: 11 %
Neutro Abs: 9 10*3/uL — ABNORMAL HIGH (ref 1.7–7.7)
Neutrophils Relative %: 75 %
Platelets: 516 10*3/uL — ABNORMAL HIGH (ref 150–400)
RBC: 2.93 MIL/uL — ABNORMAL LOW (ref 4.22–5.81)
RDW: 15.9 % — ABNORMAL HIGH (ref 11.5–15.5)
WBC: 12.1 10*3/uL — ABNORMAL HIGH (ref 4.0–10.5)
nRBC: 0 % (ref 0.0–0.2)

## 2022-08-18 MED ORDER — FENTANYL CITRATE PF 50 MCG/ML IJ SOSY
50.0000 ug | PREFILLED_SYRINGE | Freq: Once | INTRAMUSCULAR | Status: AC
  Administered 2022-08-18: 50 ug via INTRAVENOUS
  Filled 2022-08-18: qty 1

## 2022-08-18 MED ORDER — ACETAMINOPHEN 325 MG PO TABS: 650.0000 mg | ORAL_TABLET | Freq: Four times a day (QID) | ORAL | Status: DC | PRN

## 2022-08-18 MED ORDER — LACTATED RINGERS IV SOLN: INTRAVENOUS | Status: AC

## 2022-08-18 MED ORDER — HYDROMORPHONE HCL 1 MG/ML IJ SOLN
0.5000 mg | INTRAMUSCULAR | Status: DC | PRN
  Administered 2022-08-18 – 2022-08-19 (×6): 0.5 mg via INTRAVENOUS
  Filled 2022-08-18 (×3): qty 0.5
  Filled 2022-08-18: qty 1
  Filled 2022-08-18 (×2): qty 0.5

## 2022-08-18 MED ORDER — ONDANSETRON HCL 4 MG/2ML IJ SOLN: 4.0000 mg | Freq: Four times a day (QID) | INTRAMUSCULAR | Status: DC | PRN

## 2022-08-18 MED ORDER — AMOXICILLIN-POT CLAVULANATE 500-125 MG PO TABS
1.0000 | ORAL_TABLET | Freq: Two times a day (BID) | ORAL | Status: DC
  Administered 2022-08-18: 1 via ORAL
  Filled 2022-08-18 (×2): qty 1

## 2022-08-18 MED ORDER — NALOXONE HCL 0.4 MG/ML IJ SOLN: 0.4000 mg | INTRAMUSCULAR | Status: DC | PRN

## 2022-08-18 MED ORDER — SODIUM CHLORIDE 0.9 % IV BOLUS
1000.0000 mL | Freq: Once | INTRAVENOUS | Status: AC
  Administered 2022-08-18: 1000 mL via INTRAVENOUS

## 2022-08-18 MED ORDER — ONDANSETRON HCL 4 MG/2ML IJ SOLN
4.0000 mg | Freq: Once | INTRAMUSCULAR | Status: AC
  Administered 2022-08-18: 4 mg via INTRAVENOUS
  Filled 2022-08-18: qty 2

## 2022-08-18 MED ORDER — MORPHINE SULFATE (PF) 4 MG/ML IV SOLN
4.0000 mg | Freq: Once | INTRAVENOUS | Status: AC
  Administered 2022-08-18: 4 mg via INTRAVENOUS
  Filled 2022-08-18: qty 1

## 2022-08-18 MED ORDER — ACETAMINOPHEN 650 MG RE SUPP: 650.0000 mg | Freq: Four times a day (QID) | RECTAL | Status: DC | PRN

## 2022-08-18 MED ORDER — MELATONIN 3 MG PO TABS
3.0000 mg | ORAL_TABLET | Freq: Every evening | ORAL | Status: DC | PRN
  Administered 2022-08-18 – 2022-08-21 (×2): 3 mg via ORAL
  Filled 2022-08-18: qty 1

## 2022-08-18 NOTE — Telephone Encounter (Signed)
Spoke with [name] this morning to confirm surgery time for tomorrow.  He states that he is not feeling good, he feels like his "body is shutting down" and he is going to call 911 and go to the emergency room.  I advised him that surgery is scheduled for tomorrow and he is doing the right thing by going to the emergency room.  He will make sure they take him to [facility].  He told me that his nurse had given him Eliquis this am and he wondered if that would affect his surgery, per Dr. Lajoyce Corners no. Surgery can go on as scheduled for tomorrow.  Told [name] that I would make Dr. Lajoyce Corners aware and that if they needed anything from office, to please have the hospital call.

## 2022-08-18 NOTE — ED Notes (Signed)
ED TO INPATIENT HANDOFF REPORT  ED Nurse Name and Phone #: Minerva Areola [phone]  S Name/Age/Gender Caleb [name] 72 y.o. male Room/Bed: 006C/006C  Code Status   Code Status: Full Code  Home/SNF/Other Home Patient oriented to: self, place, time, and situation Is this baseline? Yes   Triage Complete: Triage complete  Chief Complaint Osteomyelitis of right lower extremity (HCC) [M86.9]  Triage Note Called ems reporting increase weakness and pain to the right foot that is due for amputation tomorrow. On Ems arrival pt BP is soft 92/50 alert oriented   Allergies Allergies  Allergen Reactions   Codeine Nausea And Vomiting   Percocet [Oxycodone-Acetaminophen] Nausea And Vomiting   Tramadol Hcl Nausea And Vomiting    Level of Care/Admitting Diagnosis ED Disposition     ED Disposition  Admit   Condition  --   Comment  Hospital Area: [facility] [[id number]]  Level of Care: Telemetry Medical [104]  May admit patient to [facility] or [facility] if equivalent level of care is available:: No  Covid Evaluation: Asymptomatic - no recent exposure (last 10 days) testing not required  Diagnosis: Osteomyelitis of right lower extremity [facility]) [[id number]]  Admitting Physician: Angie Fava [[id number]]  Attending Physician: Angie Fava [[id number]]  Certification:: I certify this patient will need inpatient services for at least 2 midnights  Estimated Length of Stay: 2          B Medical/Surgery History Past Medical History:  Diagnosis Date   Carotid artery occlusion    CHF (congestive heart failure) (HCC)    Diabetes mellitus without complication (HCC)    Type II   Dyslipidemia 09/27/2012   Erectile dysfunction 09/27/2012   GERD (gastroesophageal reflux disease)    Hypercholesteremia    Hyperlipidemia 08/12/2012   Hypertension    Hyponatremia 08/14/2012   MGUS (monoclonal gammopathy of unknown significance)    Neuropathy    Pancreatitis    Pancreatitis,  acute 09/27/2012   Peripheral vascular disease (HCC)    S/P 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 Excell Seltzer & Bartle   Severe aortic stenosis    Vitamin D deficiency 06/23/2015   Past Surgical History:  Procedure Laterality Date   ABDOMINAL AORTOGRAM W/LOWER EXTREMITY N/A 08/08/2019   Procedure: ABDOMINAL AORTOGRAM W/LOWER EXTREMITY;  Surgeon: Runell Gess, MD;  [facility]: [facility] INVASIVE CV LAB;  Service: Cardiovascular;  Laterality: N/A;   ABDOMINAL AORTOGRAM W/LOWER EXTREMITY N/A 11/18/2019   Procedure: ABDOMINAL AORTOGRAM W/LOWER EXTREMITY;  Surgeon: Runell Gess, MD;  Location: [facility] INVASIVE CV LAB;  Service: Cardiovascular;  Laterality: N/A;   ABDOMINAL AORTOGRAM W/LOWER EXTREMITY N/A 06/13/2022   Procedure: ABDOMINAL AORTOGRAM W/LOWER EXTREMITY;  Surgeon: Chuck Hint, MD;  Location: [facility] INVASIVE CV LAB;  Service: Cardiovascular;  Laterality: N/A;   APPLICATION OF WOUND VAC  06/24/2022   Procedure: APPLICATION OF WOUND VAC;  Surgeon: Nadara Mustard, MD;  Location: [facility] OR;  Service: Orthopedics;;   CARDIAC CATHETERIZATION     COLONOSCOPY  12 years ago   in [place] clinic= normal exam per pt   ENDARTERECTOMY FEMORAL Right 06/17/2022   Procedure: SUPERFICIAL ENDARTERECTOMY OF COMMON FEMORAL ARTERY;  Surgeon: Victorino Sparrow, MD;  Location: [facility] OR;  Service: Vascular;  Laterality: Right;   FEMORAL-TIBIAL BYPASS GRAFT Right 06/17/2022   Procedure: RIGHT FEMORAL- ANTERIOR TIBIAL ARTERY BYPASS;  Surgeon: Victorino Sparrow, MD;  Location: [facility] OR;  Service: Vascular;  Laterality: Right;   I & D EXTREMITY Right 06/24/2022   Procedure: RIGHT PARTIAL CALCANEUS EXCISION;  Surgeon: Nadara Mustard, MD;  Location: [facility] OR;  Service: Orthopedics;  Laterality: Right;   I & D EXTREMITY Right 07/29/2022   Procedure: RIGHT PARTIAL CALCANEUS EXCISION;  Surgeon: Nadara Mustard, MD;  Location: [facility] OR;  Service: Orthopedics;   Laterality: Right;   INTRAOPERATIVE TRANSTHORACIC ECHOCARDIOGRAM N/A 01/11/2022   Procedure: INTRAOPERATIVE TRANSTHORACIC ECHOCARDIOGRAM;  Surgeon: Tonny Bollman, MD;  Location: [facility] INVASIVE CV LAB;  Service: Open Heart Surgery;  Laterality: N/A;   KNEE SURGERY     MULTIPLE EXTRACTIONS WITH ALVEOLOPLASTY N/A 12/09/2021   Procedure: MULTIPLE EXTRACTION;  Surgeon: Sharman Cheek, DMD;  Location: [facility] OR;  Service: Dentistry;  Laterality: N/A;   PERIPHERAL VASCULAR INTERVENTION Right 08/08/2019   Procedure: PERIPHERAL VASCULAR INTERVENTION;  Surgeon: Runell Gess, MD;  [facility];  Service: Cardiovascular;  Laterality: Right;   PERIPHERAL VASCULAR INTERVENTION Left 11/18/2019   Procedure: PERIPHERAL VASCULAR INTERVENTION;  Surgeon: Runell Gess, MD;  [facility];  Service: Cardiovascular;  Laterality: Left;  left popliteal artery   RIGHT/LEFT HEART CATH AND CORONARY ANGIOGRAPHY N/A 10/29/2021   Procedure: RIGHT/LEFT HEART CATH AND CORONARY ANGIOGRAPHY;  Surgeon: Kathleene Hazel, MD;  [facility] LAB;  Service: Cardiovascular;  Laterality: N/A;   TRANSCAROTID ARTERY REVASCULARIZATION  Left 12/16/2019   Procedure: TRANSCAROTID ARTERY REVASCULARIZATION;  Surgeon: Sherren Kerns, MD;  Location[facility] OR;  Service: Vascular;  Laterality: Left;   TRANSCAROTID ARTERY REVASCULARIZATION  Right 02/03/2020   Procedure: RIGHT TRANSCAROTID ARTERY REVASCULARIZATION;  Surgeon: Sherren Kerns, MD;  Location: [facility] OR;  Service: Vascular;  Laterality: Right;   TRANSCATHETER AORTIC VALVE REPLACEMENT, TRANSFEMORAL N/A 01/11/2022   Procedure: Transcatheter Aortic Valve Replacement, Transfemoral;  Surgeon: Tonny Bollman, MD;  Location: [facility] INVASIVE CV LAB;  Service: Open Heart Surgery;  Laterality: N/A;   ULTRASOUND GUIDANCE FOR VASCULAR ACCESS Right 12/16/2019   Procedure: ULTRASOUND GUIDANCE FOR VASCULAR ACCESS;  Surgeon: Sherren Kerns, MD;   Location: [facility] OR;  Service: Vascular;  Laterality: Right;   ULTRASOUND GUIDANCE FOR VASCULAR ACCESS Right 02/03/2020   Procedure: ULTRASOUND GUIDANCE FOR VASCULAR ACCESS;  Surgeon: Sherren Kerns, MD;  Location: [facility] OR;  Service: Vascular;  Laterality: Right;   VASECTOMY     VEIN HARVEST Right 06/17/2022   Procedure: IPSILATERAL RIGHT GREATER SAPHENOUS VEIN HARVEST;  Surgeon: Victorino Sparrow, MD;  Location: [facility] OR;  Service: Vascular;  Laterality: Right;     A IV Location/Drains/Wounds Patient Lines/Drains/Airways Status     Active Line/Drains/Airways     Name Placement date Placement time Site Days   Peripheral IV 08/18/22 20 G Left Antecubital 08/18/22  1453  Antecubital  less than 1   Negative Pressure Wound Therapy Foot Right;Posterior 06/24/22  0910  --  55   Wound / Incision (Open or Dehisced) 05/27/22 Diabetic ulcer Heel Right 05/27/22  2104  Heel  83   Wound / Incision (Open or Dehisced) 08/12/22 Other (Comment) Foot Right chronic full thickness wound 08/12/22  --  Foot  6            Intake/Output Last 24 hours No intake or output data in the 24 hours ending 08/18/22 2202  Labs/Imaging Results for orders placed or performed during the hospital encounter of 08/18/22 (from the past 48 hour(s))  Comprehensive metabolic panel     Status: Abnormal  Collection Time: 08/18/22  5:51 PM  Result Value Ref Range   Sodium 129 (L) 135 - 145 mmol/L   Potassium 4.5 3.5 - 5.1 mmol/L   Chloride 92 (L) 98 - 111 mmol/L   CO2 25 22 - 32 mmol/L   Glucose, Bld 147 (H) 70 - 99 mg/dL    Comment: Glucose reference range applies only to samples taken after fasting for at least 8 hours.   BUN 39 (H) 8 - 23 mg/dL   Creatinine, Ser 2.95 (H) 0.61 - 1.24 mg/dL   Calcium 8.2 (L) 8.9 - 10.3 mg/dL   Total Protein 7.5 6.5 - 8.1 g/dL   Albumin 2.1 (L) 3.5 - 5.0 g/dL   AST 32 15 - 41 U/L   ALT 30 0 - 44 U/L   Alkaline Phosphatase 110 38 - 126 U/L   Total Bilirubin 0.5 0.3 - 1.2 mg/dL   GFR,  Estimated 32 (L) >60 mL/min    Comment: (NOTE) Calculated using the CKD-EPI Creatinine Equation (2021)    Anion gap 12 5 - 15    Comment: Performed at [facility] Lab, [address] [place], [place] [address]  CBC with Differential     Status: Abnormal   Collection Time: 08/18/22  5:51 PM  Result Value Ref Range   WBC 12.1 (H) 4.0 - 10.5 K/uL   RBC 2.93 (L) 4.22 - 5.81 MIL/uL   Hemoglobin 7.9 (L) 13.0 - 17.0 g/dL   HCT 86.5 (L) 78.4 - 69.6 %   MCV 85.0 80.0 - 100.0 fL   MCH 27.0 26.0 - 34.0 pg   MCHC 31.7 30.0 - 36.0 g/dL   RDW 29.5 (H) 28.4 - 13.2 %   Platelets 516 (H) 150 - 400 K/uL   nRBC 0.0 0.0 - 0.2 %   Neutrophils Relative % 75 %   Neutro Abs 9.0 (H) 1.7 - 7.7 K/uL   Lymphocytes Relative 14 %   Lymphs Abs 1.7 0.7 - 4.0 K/uL   Monocytes Relative 11 %   Monocytes Absolute 1.3 (H) 0.1 - 1.0 K/uL   Eosinophils Relative 0 %   Eosinophils Absolute 0.1 0.0 - 0.5 K/uL   Basophils Relative 0 %   Basophils Absolute 0.0 0.0 - 0.1 K/uL   Immature Granulocytes 0 %   Abs Immature Granulocytes 0.03 0.00 - 0.07 K/uL    Comment: Performed at [facility] Lab, [address], [place], [place] [address]   DG Foot Complete Right  Result Date: 08/18/2022 CLINICAL DATA:  Right foot pain. EXAM: RIGHT FOOT COMPLETE - 3+ VIEW COMPARISON:  June 07, 2022. FINDINGS: Status post amputation of posterior calcaneus. There is some irregularity involving the residual portion of the anterior calcaneus which may be postsurgical in etiology, but osteomyelitis cannot be excluded. Vascular calcifications are noted. No other fracture or dislocation is noted. IMPRESSION: Status post surgical amputation of posterior calcaneus. Irregularity is seen involving residual portion of anterior calcaneus which may be postsurgical in etiology, but osteomyelitis cannot be excluded. Electronically Signed   By: Lupita Raider M.D.   On: 08/18/2022 16:05    Pending Labs Unresulted Labs (From admission, onward)      Start     Ordered   08/19/22 0500  CBC with Differential/Platelet  Tomorrow morning,   R        08/18/22 2134   08/19/22 0500  Comprehensive metabolic panel  Tomorrow morning,   R        08/18/22 2134  08/19/22 0500  Magnesium  Tomorrow morning,   R        08/18/22 2134   08/19/22 0500  Protime-INR  Tomorrow morning,   R        08/18/22 2134   08/18/22 2135  Magnesium  Add-on,   AD        08/18/22 2134            Vitals/Pain Today's Vitals   08/18/22 1932 08/18/22 1948 08/18/22 2000 08/18/22 2024  BP:   (!) 136/96   Pulse:   94   Resp:   17   Temp: 97.8 F (36.6 C)     TempSrc: Oral     SpO2:   100%   Weight:      Height:      PainSc:  10-Worst pain ever  6     Isolation Precautions No active isolations  Medications Medications  naloxone (NARCAN) injection 0.4 mg (has no administration in time range)  HYDROmorphone (DILAUDID) injection 0.5 mg (0.5 mg Intravenous Given 08/18/22 2152)  acetaminophen (TYLENOL) tablet 650 mg (has no administration in time range)    Or  acetaminophen (TYLENOL) suppository 650 mg (has no administration in time range)  melatonin tablet 3 mg (3 mg Oral Given 08/18/22 2152)  ondansetron (ZOFRAN) injection 4 mg (has no administration in time range)  amoxicillin-clavulanate (AUGMENTIN) 500-125 MG per tablet 1 tablet (has no administration in time range)  lactated ringers infusion ( Intravenous New Bag/Given 08/18/22 2152)  ondansetron (ZOFRAN) injection 4 mg (4 mg Intravenous Given 08/18/22 1557)  morphine (PF) 4 MG/ML injection 4 mg (4 mg Intravenous Given 08/18/22 1558)  sodium chloride 0.9 % bolus 1,000 mL (0 mLs Intravenous Stopped 08/18/22 1904)  fentaNYL (SUBLIMAZE) injection 50 mcg (50 mcg Intravenous Given 08/18/22 2013)  sodium chloride 0.9 % bolus 1,000 mL (0 mLs Intravenous Stopped 08/18/22 2107)    Mobility walks     Focused Assessments Cardiac Assessment Handoff:    Lab Results  Component Value Date   CKTOTAL 139 08/11/2022    CKMB 1.3 09/17/2009   TROPONINI <0.30 08/12/2012   No results found for: "DDIMER" Does the Patient currently have chest pain? No    R Recommendations: See Admitting Provider Note  Report given to:   Additional Notes: a and o x 4

## 2022-08-18 NOTE — Transitions of Care (Post Inpatient/ED Visit) (Signed)
08/18/2022  Name: Caleb [name] MRN: [id number] DOB: February 09, 1951  Today's TOC FU Call Status: Today's TOC FU Call Status:: Successful TOC FU Call Competed TOC FU Call Complete Date: 08/18/22  Transition Care Management Follow-up Telephone Call Date of Discharge: 08/15/22 Discharge Facility: [facility]) Type of Discharge: Inpatient Admission Primary Inpatient Discharge Diagnosis:: "AKI" How have you been since you were released from the hospital?: Worse (Pt reports increased pain to foot/leg-has not been able to get pain under control-only has Tylenol and Neurontin to take-Rx pain meds stopped in hosp due to low BP .HHRN was there-she has already contacted surgeon office and await return call.) Any questions or concerns?: Yes Patient Questions/Concerns:: Pt. states he is supposed to have surgery-R BKA tomorrow but has not heard from surgeon office regarding pre-op instructions, what meds to take/hold and when to stop eating before procedure Patient Questions/Concerns Addressed: Other: [facility] has already contacted surgeon office and awaiting return call)  Items Reviewed: Did you receive and understand the discharge instructions provided?: Yes Medications obtained,verified, and reconciled?: Yes (Medications Reviewed) Any new allergies since your discharge?: No Dietary orders reviewed?: Yes Type of Diet Ordered:: low salt/heart healthy/carb modified Do you have support at home?: Yes People in Home: alone Name of Support/Comfort Primary Source: cousin helps out  Medications Reviewed Today: Medications Reviewed Today     Reviewed by Charlyn Minerva, RN (Registered Nurse) on 08/18/22 at 1055  Med List Status: <None>   Medication Order Taking? Sig Documenting Provider Last Dose Status Informant  acetaminophen (TYLENOL) 500 MG tablet [id number] Yes Take 1 tablet (500 mg total) by mouth every 8 (eight) hours as needed for moderate pain. Leroy Sea, MD Taking Active    amoxicillin-clavulanate (AUGMENTIN) 500-125 MG tablet [id number] Yes Take 1 tablet by mouth 2 (two) times daily. Leroy Sea, MD Taking Active   apixaban (ELIQUIS) 5 MG TABS tablet [id number] Yes Take 1 tablet (5 mg total) by mouth 2 (two) times daily. Alberteen Sam, MD Taking Active Self  ARTIFICIAL TEARS PF 0.1-0.3 % SOLN [id number]  Place 1 drop into both eyes every 8 (eight) hours as needed (for dryness). [provider]  Active Self  aspirin EC 81 MG tablet [id number] Yes Take 81 mg by mouth in the morning. [provider] Taking Active Self  atorvastatin (LIPITOR) 40 MG tablet [id number] Yes Take 1 tablet (40 mg total) by mouth in the morning. Leroy Sea, MD Taking Active   carvedilol (COREG) 3.125 MG tablet [id number] Yes Take 1 tablet (3.125 mg total) by mouth 2 (two) times daily with a meal. Leroy Sea, MD Taking Active   Cholecalciferol (VITAMIN D-3) 25 MCG (1000 UT) CAPS [id number] Yes Take 1,000 Units by mouth daily. [provider] Taking Active Self  Continuous Blood Gluc Sensor (FREESTYLE LIBRE 14 DAY SENSOR) MISC [id number] Yes Inject 1 Device into the skin every 14 (fourteen) days. [provider] Taking Active Self  ferrous sulfate 325 (65 FE) MG tablet [id number] Yes Take 325 mg by mouth daily with breakfast. [provider] Taking Active Self  furosemide (LASIX) 80 MG tablet [id number] Yes Take 1 tablet (80 mg total) by mouth daily. And 1 tablet (40 mg) in the evening Leroy Sea, MD Taking Active   gabapentin (NEURONTIN) 300 MG capsule [id number] Yes Take 1 capsule (300 mg total) by mouth 3 (three) times daily. Leroy Sea, MD Taking Active   hydrALAZINE (APRESOLINE) 10 MG tablet [id number] Yes Take 1 tablet (  10 mg total) by mouth 3 (three) times daily. Hoy Register, MD Taking Active Self  insulin NPH-regular Human (70-30) 100 UNIT/ML injection [id number] Yes Inject 7-8 Units into the skin See admin  instructions. Inject 8 units into the skin before breakfast and 7 units before the evening meal [provider] Taking Active Self  isosorbide mononitrate (IMDUR) 30 MG 24 hr tablet [id number] Yes Take 0.5 tablets (15 mg total) by mouth daily. Leroy Sea, MD Taking Active   [employer] [id number]  Take 16 oz by mouth See admin instructions. [employer] Signature Organic Raw Kombucha, Ginger Lemonade- Drink 16 ounces by mouth once a day [provider]  Active Self  ondansetron (ZOFRAN) 4 MG tablet [id number] Yes Take 1 tablet (4 mg total) by mouth every 8 (eight) hours as needed for nausea or vomiting. Hoy Register, MD Taking Active Self  polyethylene glycol (MIRALAX / GLYCOLAX) 17 g packet [id number] Yes Take 17 g by mouth daily.  Patient taking differently: Take 17 g by mouth daily as needed for mild constipation.   Osvaldo Shipper, MD Taking Active Self  PRESCRIPTION MEDICATION [id number]  Inject 0.1-1 mLs as directed See admin instructions. Tri-Mix Standard Strength 5 ml. Formula: Prostaglandin 10 mcg/ml, Papaverine 30 mg/ml, Phentolamine 1 mg/ml. Inject 0.1 ml into side of penis as directed as needed (to be injected immediately before sexual intercourse) may increase the dose by 0.1 ml every 48 hours to achieve an erection. Max dose 1 ml. [provider]  Active Self           Med Note [facility], Carleene Overlie Jan 17, 2022 11:41 AM)    vitamin B-12 (CYANOCOBALAMIN) 500 MCG tablet [id number] Yes Take 500 mcg by mouth in the morning. [provider] Taking Active Self            Home Care and Equipment/Supplies: Were Home Health Services Ordered?: Yes Name of Home Health Agency:: [facility] Has Agency set up a time to come to your home?: Yes First Home Health Visit Date: 08/18/22 Any new equipment or medical supplies ordered?: No  Functional Questionnaire: Do you need assistance with bathing/showering or dressing?: No Do you need assistance with  meal preparation?: No Do you need assistance with eating?: No Do you have difficulty maintaining continence: No Do you need assistance with getting out of bed/getting out of a chair/moving?: No Do you have difficulty managing or taking your medications?: No  Follow up appointments reviewed: PCP Follow-up appointment confirmed?: No (Pt going back into hospital tomorrow for scheduled surgery-will make appt when he knows d/c dispostion/plan) MD Provider Line [phone] Given: No Specialist Hospital Follow-up appointment confirmed?: No (Pt  has contacted surgeon office(Dr. Lajoyce Corners) about followup-going back into hospital tomorrow for scheduled surgery-will make appt when he knows d/c dispostion/plan) Do you need transportation to your follow-up appointment?: No Do you understand care options if your condition(s) worsen?: Yes-patient verbalized understanding   TOC Interventions Today    Flowsheet Row Most Recent Value  TOC Interventions   TOC Interventions Discussed/Reviewed TOC Interventions Discussed, Post discharge activity limitations per provider, Post op wound/incision care, S/S of infection      Interventions Today    Flowsheet Row Most Recent Value  Chronic Disease   Chronic disease during today's visit Diabetes  General Interventions   General Interventions Discussed/Reviewed General Interventions Discussed, Doctor Visits, Durable Medical Equipment (DME)  Doctor Visits Discussed/Reviewed Doctor Visits Discussed, PCP, Specialist  Durable Medical Equipment (DME) Glucomoter, BP Cuff  [  pt reports BP better since returning home-120s]  PCP/Specialist Visits Compliance with follow-up visit  Education Interventions   Education Provided Provided Education  Provided Verbal Education On When to see the doctor, Medication, Blood Sugar Monitoring, Nutrition, Other  [pain mgmt-pt reported current pain level 3/10]  Nutrition Interventions   Nutrition Discussed/Reviewed Nutrition  Discussed, Decreasing salt, Decreasing fats, Decreasing sugar intake  Pharmacy Interventions   Pharmacy Dicussed/Reviewed Pharmacy Topics Discussed, Medications and their functions  Safety Interventions   Safety Discussed/Reviewed Safety Discussed  Home Safety Assistive Devices         Promyse Ardito Magda Paganini [facility] Care Management Care Management Community Coordinator Direct Phone: [phone] Toll Free: [phone] Fax: [phone]

## 2022-08-18 NOTE — ED Triage Notes (Signed)
Called ems reporting increase weakness and pain to the right foot that is due for amputation tomorrow. On Ems arrival pt BP is soft 92/50 alert oriented

## 2022-08-18 NOTE — H&P (Signed)
History and Physical      Caleb [name]:[id number] DOB: 02-08-1951 DOA: 08/18/2022; DOS: 08/18/2022  PCP: Hoy Register, MD  Patient coming from: home   I have personally briefly reviewed patient's old medical records in [facility]  Chief Complaint: Right foot pain  HPI: Caleb [name] is a 72 y.o. male with medical history significant for chronic osteomyelitis of the right heel, chronic systolic/diastolic heart failure, type 2 diabetes mellitus complicated by diabetic peripheral polyneuropathy, hyperlipidemia, essential hypertension, severe aortic stenosis status post TAVR, peripheral artery disease, chronic hyponatremia, anemia of chronic disease associated baseline hemoglobin range 8-10, who is admitted to [facility] on 08/18/2022 with acute kidney injury after presenting from home to [facility] ED complaining of right foot pain.   The patient carries diagnosis of chronic osteomyelitis of the right heel for which he follows with Dr.Duda Of orthopedic surgery, with plan to undergo right below the knee amputation tomorrow (5/31).  However, patient conveys that he is unable to manage adequately his pain relating to his right foot discomfort in the interval leading up to tomorrow as anticipated right below the knee amputation, prompting him to present to [facility] emergency department this evening for further evaluation and management thereof.  He notes history of several adverse reactions to multiple oral opioids, conveying that he tolerates opioids much better when administered intravenously.  Denies any recent subjective fever, chills, rigors, or generalized myalgias.  He was recently hospitalized at [facility], including for acute kidney injury.  Most recent creatinine was noted to be 1.47 on 08/15/2022.  Outpatient medications include gabapentin, which she takes for diabetic peripheral polyneuropathy.  He is also on scheduled Lasix as an outpatient.  Denies any recent chest pain,  shortness of breath, orthopnea, PND, or worsening of peripheral edema.  He is on Eliquis until baby aspirin for secondary prevention in the setting of history of peripheral artery disease.     ED Course:  Vital signs in the ED were notable for the following: Afebrile; heart rates in the 80s; systolic blood pressures in the low 100s to 130s; better rate 17-18, oxygen saturation 95 to 100% on room air.  Labs were notable for the following: CMP notable for sodium 129 relative to most recent prior serum sodium level 133 on 08/15/2022, potassium 4.5, creatinine 2.15, glucose 147, calcium, just for mild hypoalbuminemia noted to be 9.8, albumin 2.1, otherwise, liver enzymes within normal limits.  CBC notable for blood cell count 12,100 relative to 11,600 on 08/15/2022, hemoglobin 7.9 associated normocytic/recurrent properties.  Imaging and additional notable ED work-up: Plain films of the right foot, showed no evidence of acute fracture.  EDP discussed patient's case with Charma Igo, PA (ortho), who is serving as liaison to Dr. Lajoyce Corners. Leotis Shames to convey this info on to Dr. Lajoyce Corners who will formally consult, with tentative plan at this time to proceed with right BKA tomorrow, while striving for interval improvement in pain control leading up to this procedure and further eval/management of aki.   While in the ED, the following were administered: Fentanyl 50 mcg IV x 1 dose, morphine 4 mg IV x 1 dose, Zofran 4 mg IV x 1 dose, normal saline x 2 L bolus.  Subsequently, the patient was admitted for further evaluation management of presenting acute kidney injury, with plan to pursue right below the knee amputation for chronic osteomyelitis tomorrow (5/31).     Review of Systems: As per HPI otherwise 10 point review of systems negative.  Past Medical History:  Diagnosis Date   Carotid artery occlusion    CHF (congestive heart failure) (HCC)    Diabetes mellitus without complication (HCC)    Type II    Dyslipidemia 09/27/2012   Erectile dysfunction 09/27/2012   GERD (gastroesophageal reflux disease)    Hypercholesteremia    Hyperlipidemia 08/12/2012   Hypertension    Hyponatremia 08/14/2012   MGUS (monoclonal gammopathy of unknown significance)    Neuropathy    Pancreatitis    Pancreatitis, acute 09/27/2012   Peripheral vascular disease (HCC)    S/P 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 Excell Seltzer & Bartle   Severe aortic stenosis    Vitamin D deficiency 06/23/2015    Past Surgical History:  Procedure Laterality Date   ABDOMINAL AORTOGRAM W/LOWER EXTREMITY N/A 08/08/2019   Procedure: ABDOMINAL AORTOGRAM W/LOWER EXTREMITY;  Surgeon: Runell Gess, MD;  Location: [facility] INVASIVE CV LAB;  Service: Cardiovascular;  Laterality: N/A;   ABDOMINAL AORTOGRAM W/LOWER EXTREMITY N/A 11/18/2019   Procedure: ABDOMINAL AORTOGRAM W/LOWER EXTREMITY;  Surgeon: Runell Gess, MD;  Location: [facility] INVAS[facility] CV LAB;  Service: Cardiovascular;  Laterality: N/A;   ABDOMINAL AORTOGRAM W/LOWER EXTREMITY N/A 06/13/2022   Procedure: ABDOMINAL AORTOGRAM W/LOWER EXTREMITY;  Surgeon: Chuck Hint, MD;  Location: [facility] INVASIVE CV LAB;  Service: Cardiovascular;  Laterality: N/A;   APPLICATION OF WOUND VAC  06/24/2022   Procedure: APPLICATION OF WOUND VAC;  Surgeon: Nadara Mustard, MD;  Location: [facility] OR;  Service: Orthopedics;;   CARDIAC CATHETERIZATION     COLONOSCOPY  12 years ago   in [place] clinic= normal exam per pt   ENDARTERECTOMY FEMORAL Right 06/17/2022   Procedure: SUPERFICIAL ENDARTERECTOMY OF COMMON FEMORAL ARTERY;  Surgeon: Victorino Sparrow, MD;  Location: [facility] OR;  Service: Vascular;  Laterality: Right;   FEMORAL-TIBIAL BYPASS GRAFT Right 06/17/2022   Procedure: RIGHT FEMORAL- ANTERIOR TIBIAL ARTERY BYPASS;  Surgeon: Victorino Sparrow, MD;  Location: [facility] OR;  Service: Vascular;  Laterality: Right;   I & D EXTREMITY Right  06/24/2022   Procedure: RIGHT PARTIAL CALCANEUS EXCISION;  Surgeon: Nadara Mustard, MD;  Location: [facility] OR;  Service: Orthopedics;  Laterality: Right;   I & D EXTREMITY Right 07/29/2022   Procedure: RIGHT PARTIAL CALCANEUS EXCISION;  Surgeon: Nadara Mustard, MD;  Location: [facility] OR;  Service: Orthopedics;  Laterality: Right;   INTRAOPERATIVE TRANSTHORACIC ECHOCARDIOGRAM N/A 01/11/2022   Procedure: INTRAOPERATIVE TRANSTHORACIC ECHOCARDIOGRAM;  Surgeon: Tonny Bollman, MD;  Location: [facility] INVASIVE CV LAB;  Service: Open Heart Surgery;  Laterality: N/A;   KNEE SURGERY     MULTIPLE EXTRACTIONS WITH ALVEOLOPLASTY N/A 12/09/2021   Procedure: MULTIPLE EXTRACTION;  Surgeon: Sharman Cheek, DMD;  Location: [facility] OR;  Service: Dentistry;  Laterality: N/A;   PERIPHERAL VASCULAR INTERVENTION Right 08/08/2019   Procedure: PERIPHERAL VASCULAR INTERVENTION;  Surgeon: Runell Gess, MD;  [facility]: [facility];  Service: Cardiovascular;  Laterality: Right;   PERIPHERAL VASCULAR INTERVENTION Left 11/18/2019   Procedure: PERIPHERAL VASCULAR INTERVENTION;  Surgeon: Runell Gess, MD;  [facility]: [facility];  Service: Cardiovascular;  Laterality: Left;  left popliteal artery   RIGHT/LEFT HEART CATH AND CORONARY ANGIOGRAPHY N/A 10/29/2021   Procedure: RIGHT/LEFT HEART CATH AND CORONARY ANGIOGRAPHY;  Surgeon: Kathleene Hazel, MD;  [facility]: [facility];  Service: Cardiovascular;  Laterality: N/A;   TRANSCAROTID ARTERY REVASCULARIZATION  Left 12/16/2019   Procedure:  TRANSCAROTID ARTERY REVASCULARIZATION;  Surgeon: Sherren Kerns, MD;  Location: [facility] OR;  Service: Vascular;  Laterality: Left;   TRANSCAROTID ARTERY REVASCULARIZATION  Right 02/03/2020   Procedure: RIGHT TRANSCAROTID ARTERY REVASCULARIZATION;  Surgeon: Sherren Kerns, MD;  Location: [facility] OR;  Service: Vascular;  Laterality: Right;   TRANSCATHETER AORTIC VALVE REPLACEMENT, TRANSFEMORAL N/A 01/11/2022   Procedure: Transcatheter  Aortic Valve Replacement, Transfemoral;  Surgeon: Tonny Bollman, MD;  Location: [facility] INVASIVE CV LAB;  Service: Open Heart Surgery;  Laterality: N/A;   ULTRASOUND GUIDANCE FOR VASCULAR ACCESS Right 12/16/2019   Procedure: ULTRASOUND GUIDANCE FOR VASCULAR ACCESS;  Surgeon: Sherren Kerns, MD;  Location: [facility] OR;  Service: Vascular;  Laterality: Right;   ULTRASOUND GUIDANCE FOR VASCULAR ACCESS Right 02/03/2020   Procedure: ULTRASOUND GUIDANCE FOR VASCULAR ACCESS;  Surgeon: Sherren Kerns, MD;  Location: [facility] OR;  Service: Vascular;  Laterality: Right;   VASECTOMY     VEIN HARVEST Right 06/17/2022   Procedure: IPSILATERAL RIGHT GREATER SAPHENOUS VEIN HARVEST;  Surgeon: Victorino Sparrow, MD;  Location: [facility] OR;  Service: Vascular;  Laterality: Right;    Social History:  reports that he has never smoked. He has never been exposed to tobacco smoke. He has never used smokeless tobacco. He reports current alcohol use. He reports that he does not use drugs.   Allergies  Allergen Reactions   Codeine Nausea And Vomiting   Percocet [Oxycodone-Acetaminophen] Nausea And Vomiting   Tramadol Hcl Nausea And Vomiting    Family History  Problem Relation Age of Onset   CAD Father    Hypertension Father    Alcohol abuse Father        Cause of death   Diabetes Mother    Colon polyps Mother    CAD Brother 67       CABG   Colon cancer Neg Hx    Esophageal cancer Neg Hx    Rectal cancer Neg Hx    Stomach cancer Neg Hx     Family history reviewed and not pertinent    Prior to Admission medications   Medication Sig Start Date End Date Taking? Authorizing Provider  acetaminophen (TYLENOL) 500 MG tablet Take 1 tablet (500 mg total) by mouth every 8 (eight) hours as needed for moderate pain. 08/15/22 08/15/23 Yes Leroy Sea, MD  amoxicillin-clavulanate (AUGMENTIN) 500-125 MG tablet Take 1 tablet by mouth 2 (two) times daily. 08/15/22  Yes Leroy Sea, MD  apixaban (ELIQUIS) 5 MG TABS tablet  Take 1 tablet (5 mg total) by mouth 2 (two) times daily. 08/04/22  Yes Danford, Earl Lites, MD  aspirin EC 81 MG tablet Take 81 mg by mouth in the morning.   Yes [provider]  atorvastatin (LIPITOR) 40 MG tablet Take 1 tablet (40 mg total) by mouth in the morning. 08/15/22  Yes Leroy Sea, MD  carvedilol (COREG) 3.125 MG tablet Take 1 tablet (3.125 mg total) by mouth 2 (two) times daily with a meal. 08/16/22  Yes Leroy Sea, MD  Cholecalciferol (VITAMIN D-3) 25 MCG (1000 UT) CAPS Take 1,000 Units by mouth daily.   Yes [provider]  ferrous sulfate 325 (65 FE) MG tablet Take 325 mg by mouth daily with breakfast.   Yes [provider]  furosemide (LASIX) 80 MG tablet Take 1 tablet (80 mg total) by mouth daily. And 1 tablet (40 mg) in the evening 08/15/22  Yes Leroy Sea, MD  gabapentin (NEURONTIN) 300 MG capsule Take 1  capsule (300 mg total) by mouth 3 (three) times daily. 08/15/22  Yes Leroy Sea, MD  hydrALAZINE (APRESOLINE) 10 MG tablet Take 1 tablet (10 mg total) by mouth 3 (three) times daily. 07/25/22 08/24/22 Yes Hoy Register, MD  insulin NPH-regular Human (70-30) 100 UNIT/ML injection Inject 7-8 Units into the skin See admin instructions. Inject 8 units into the skin before breakfast and 7 units before the evening meal   Yes [provider]  isosorbide mononitrate (IMDUR) 30 MG 24 hr tablet Take 0.5 tablets (15 mg total) by mouth daily. 08/15/22  Yes Leroy Sea, MD  ondansetron (ZOFRAN) 4 MG tablet Take 1 tablet (4 mg total) by mouth every 8 (eight) hours as needed for nausea or vomiting. 07/25/22  Yes Newlin, Odette Horns, MD  polyethylene glycol (MIRALAX / GLYCOLAX) 17 g packet Take 17 g by mouth daily. Patient taking differently: Take 17 g by mouth daily as needed for mild constipation. 06/02/22  Yes Osvaldo Shipper, MD  vitamin B-12 (CYANOCOBALAMIN) 500 MCG tablet Take 500 mcg by mouth in the morning. 08/24/21  Yes [provider]  Continuous Blood Gluc Sensor (FREESTYLE LIBRE 14 DAY SENSOR) MISC Inject 1 Device into the skin every 14 (fourteen) days.    [provider]  PRESCRIPTION MEDICATION Inject 0.1-1 mLs as directed See admin instructions. Tri-Mix Standard Strength 5 ml. Formula: Prostaglandin 10 mcg/ml, Papaverine 30 mg/ml, Phentolamine 1 mg/ml. Inject 0.1 ml into side of penis as directed as needed (to be injected immediately before sexual intercourse) may increase the dose by 0.1 ml every 48 hours to achieve an erection. Max dose 1 ml. Patient not taking: Reported on 08/18/2022    [provider]     Objective    Physical Exam: Vitals:   08/18/22 1526 08/18/22 1528 08/18/22 1932 08/18/22 2000  BP:    (!) 136/96  Pulse: 85 85  94  Resp:    17  Temp: 97.8 F (36.6 C)  97.8 F (36.6 C)   TempSrc: Oral  Oral   SpO2: 95% 96%  100%  Weight:      Height:        General: appears to be stated age; alert, oriented Skin: warm, dry, foot wound associated the right heel noted  head:  AT/[place] Mouth:  Oral mucosa membranes appear dry, normal dentition Neck: supple; trachea midline Heart:  RRR; did not appreciate any M/R/G Lungs: CTAB, did not appreciate any wheezes, rales, or rhonchi Abdomen: + BS; soft, ND, NT Vascular: 2+ pedal pulses b/l; 2+ radial pulses b/l Extremities: no peripheral edema, no muscle wasting Neuro: strength and sensation intact in upper and lower extremities b/l    Labs on Admission: I have personally reviewed following labs and imaging studies  CBC: Recent Labs  Lab 08/12/22 0803 08/13/22 0232 08/14/22 0422 08/15/22 0336 08/18/22 1751  WBC 10.6* 10.5 11.2* 11.6* 12.1*  NEUTROABS  --  6.9 8.5* 8.1* 9.0*  HGB 9.3* 8.3* 8.0* 7.7* 7.9*  HCT 29.0* 25.9* 25.0* 24.6* 24.9*  MCV 81.7 81.7 82.0 82.3 85.0  PLT 496* 484* 432* 458* 516*   Basic Metabolic Panel: Recent Labs  Lab 08/12/22 0803 08/13/22 0232 08/14/22 0422 08/15/22 0336  08/18/22 1751  NA 128* 129* 127* 133* 129*  K 4.7 4.8 5.0 5.4* 4.5  CL 93* 95* 94* 98 92*  CO2 20* 24 23 25 25   GLUCOSE 130* 145* 182* 144* 147*  BUN 44* 40* 41* 35* 39*  CREATININE 2.27* 2.05* 1.77* 1.47*  2.15*  CALCIUM 8.5* 8.4* 8.1* 8.5* 8.2*  MG 2.6* 2.5* 2.2 2.1  --   PHOS 4.3  --   --   --   --    GFR: Estimated Creatinine Clearance: 31.5 mL/min (A) (by C-G formula based on SCr of 2.15 mg/dL (H)). Liver Function Tests: Recent Labs  Lab 08/12/22 0803 08/14/22 0445 08/18/22 1751  AST 27 33 32  ALT 19 24 30   ALKPHOS 84 105 110  BILITOT 0.5 0.5 0.5  PROT 7.6 7.0 7.5  ALBUMIN 2.5* 2.3* 2.1*   No results for input(s): "LIPASE", "AMYLASE" in the last 168 hours. No results for input(s): "AMMONIA" in the last 168 hours. Coagulation Profile: Recent Labs  Lab 08/13/22 0232  INR 1.3*   Cardiac Enzymes: Recent Labs  Lab 08/11/22 2214  CKTOTAL 139   BNP (last 3 results) No results for input(s): "PROBNP" in the last 8760 hours. HbA1C: No results for input(s): "HGBA1C" in the last 72 hours. CBG: Recent Labs  Lab 08/14/22 1956 08/15/22 0019 08/15/22 0416 08/15/22 0738 08/15/22 1142  GLUCAP 218* 166* 151* 114* 190*   Lipid Profile: No results for input(s): "CHOL", "HDL", "LDLCALC", "TRIG", "CHOLHDL", "LDLDIRECT" in the last 72 hours. Thyroid Function Tests: No results for input(s): "TSH", "T4TOTAL", "FREET4", "T3FREE", "THYROIDAB" in the last 72 hours. Anemia Panel: No results for input(s): "VITAMINB12", "FOLATE", "FERRITIN", "TIBC", "IRON", "RETICCTPCT" in the last 72 hours. Urine analysis:    Component Value Date/Time   COLORURINE YELLOW 08/12/2022 0036   APPEARANCEUR CLEAR 08/12/2022 0036   LABSPEC 1.013 08/12/2022 0036   PHURINE 5.0 08/12/2022 0036   GLUCOSEU >=500 (A) 08/12/2022 0036   HGBUR NEGATIVE 08/12/2022 0036   BILIRUBINUR NEGATIVE 08/12/2022 0036   BILIRUBINUR neg 06/09/2015 1553   KETONESUR NEGATIVE 08/12/2022 0036   PROTEINUR 30 (A)  08/12/2022 0036   UROBILINOGEN 0.2 06/09/2015 1553   UROBILINOGEN 0.2 08/12/2012 1613   NITRITE NEGATIVE 08/12/2022 0036   LEUKOCYTESUR NEGATIVE 08/12/2022 0036    Radiological Exams on Admission: DG Foot Complete Right  Result Date: 08/18/2022 CLINICAL DATA:  Right foot pain. EXAM: RIGHT FOOT COMPLETE - 3+ VIEW COMPARISON:  June 07, 2022. FINDINGS: Status post amputation of posterior calcaneus. There is some irregularity involving the residual portion of the anterior calcaneus which may be postsurgical in etiology, but osteomyelitis cannot be excluded. Vascular calcifications are noted. No other fracture or dislocation is noted. IMPRESSION: Status post surgical amputation of posterior calcaneus. Irregularity is seen involving residual portion of anterior calcaneus which may be postsurgical in etiology, but osteomyelitis cannot be excluded. Electronically Signed   By: Lupita Raider M.D.   On: 08/18/2022 16:05      Assessment/Plan   Principal Problem:   AKI (acute kidney injury) (HCC) Active Problems:   Chronic systolic CHF (congestive heart failure) (HCC)   Anemia of chronic disease   DM2 (diabetes mellitus, type 2) (HCC)   Hyperlipidemia   Chronic hyponatremia   Essential hypertension   Osteomyelitis of right lower extremity (HCC)      #) Acute Kidney Injury:  as quantified above.  Suspect that this is prerenal, with clinical evidence of mild dehydration, with potential pharmacologic exacerbation via outpatient scheduled gabapentin.  Will pursue gentle IV fluids, will being cognizant of the patient's history of chronic systolic/diastolic heart failure in order to avoid ensuing development of acute volume overload.  Plan: monitor strict I's & O's and daily weights. Attempt to avoid nephrotoxic agents.  Hold home gabapentin.  Refrain from NSAIDs.  Repeat CMP in the morning. Check serum magnesium level.  Check urinalysis with microscopy.  Add-on random urine sodium and random urine  creatinine.  Lactated Ringer's at 75 cc/h x 12 hours.               #) Chronic osteomyelitis of the right foot, relating to right heel wound: Documented history of such, with outpatient plan via Dr.Duda of Orthopedics to perform right BKA tomorrow (5/31).  EDP discussed patient's case with Charma Igo, PA (ortho), who is serving as liaison to Dr. Lajoyce Corners. Leotis Shames to convey this info on to Dr. Lajoyce Corners who will formally consult, with tentative plan at this time to proceed with right BKA tomorrow. Wil strive for interval improvement in pain control leading up to this anticipated surgery, as further detailed below.  Patient is afebrile, and has chronic leukocytosis.  Therefore, surgical cure not met for sepsis at this time.  He appears hemodynamically stable.  Will continue his outpatient oral antibiotics leading up to tomorrow's anticipated surgery.  Chales Abrahams Score for this patient in the context of anticipated aforementioned surgery conveys a  1.28% perioperative risk for significant cardiac event. No evidence to suggest acutely decompensated heart failure or acute MI. Consequently, no absolute contraindications to proceeding with proposed surgery at this time.  Plan: Orthopedic surgery consulted, as above, plan for right BKA tomorrow.  N.p.o. at midnight.  As needed IV Dilaudid.  As needed IV Zofran.  Check INR.  Preoperative EKG ordered.  Type and screen ordered.  Hold home Eliquis and daily baby aspirin.  Home Augmentin, as above.                    #) Chronic systolic/diastolic heart failure: documented history of such, with most recent echocardiogram performed in March 2024, which was notable for LVEF 25 to 30%, grade 2 diastolic dysfunction, and mildly reduced right ventricular systolic function. No clinical evidence to suggest acutely decompensated heart failure at this time. home diuretic regimen reportedly consists of the following: Lasix 80 mg p.o. every morning and 40 mg  p.o. nightly. Home cardiac medications also include the following: Coreg.  Does not appear to be on an ACE inhibitor or an ARB.  No spironolactone.  Clinically, the patient appears mildly dehydrated.  Consequently, will hold home Lasix and provide gentle IV fluids, close monitoring of ensuing volume status as outlined below.  Plan: monitor strict I's & O's and daily weights. Repeat BMP in AM. Check serum mag level.  Hold home Lasix for now.  In the setting of current n.p.o. status, will hold home Coreg for now, with plan to resume home beta-blocker postoperatively, for associated mortality benefit.            #) Type 2 Diabetes Mellitus: documented history of such. Home insulin regimen: Insulin 70/30, 8 units SQ every morning and 7 units SQ nightly.  His diabetes is also complicated by diabetic peripheral polyneuropathy, for which she is on gabapentin at home.  Will hold home gabapentin for now in the context of presenting acute kidney injury.. Home oral hypoglycemic agents: None. presenting blood sugar: 147. Most recent A1c noted to be 7.9% when checked on 08/11/2022.   Plan: In the setting of current n.p.o. status, every 6 hours with low dose SSI.  Hold scheduled basal and short acting insulin for now.  Hold home gabapentin for now in setting of presenting acute kidney injury.               #)  Hyperlipidemia: documented h/o such. On high intensity atorvastatin as outpatient.   Plan: Hold home statin for now in the context of current n.p.o. status.                #) Essential Hypertension: documented h/o such, with outpatient antihypertensive regimen including Coreg, hydralazine, Imdur.  SBP's in the ED today: Low 100s 130s mmHg.   Plan: Close monitoring of subsequent BP via routine VS. hold home and hypertensive medications for now in the setting of current n.p.o. status.               #) Anemia of chronic disease: Documented history of such, a/w  with baseline hgb range 8-10, with presenting hgb consistent with this range, in the absence of any overt evidence of active bleed.  Of note, he is on daily oral iron supplementation as an outpatient.   Plan: Repeat CBC in the morning.  Check INR.  Type and screen in the setting of impending right below the knee amputation anticipated for the morning.  Hold home oral iron in the context of current n.p.o. status.              #) Chronic hyponatremia: Documented history of such, associated baseline serum sodium range of 1 27-1 33, with presenting serum sodium level consistent with his baseline range.  Plan: Monitor strict I's and O's and daily weights.  Repeat CMP in the morning.     DVT prophylaxis: SCD's   Code Status: Full code Family Communication: none Disposition Plan: Per Rounding Team Consults called: EDP discussed with on-call ortho, including representatives of Dr. Lajoyce Corners, with plan for associated formal consultation, as further detailed above Admission status: Inpatient     I SPENT GREATER THAN 75  MINUTES IN CLINICAL CARE TIME/MEDICAL DECISION-MAKING IN COMPLETING THIS ADMISSION.      Chaney Born Olman Yono DO [facility]  From 7PM - 7AM   08/18/2022, 9:41 PM

## 2022-08-18 NOTE — ED Provider Notes (Signed)
72 yo male presenting with chronic right foot pain -  Planned for outpatient right BKA tomorrow with Dr Lajoyce Corners, but patient having pain control issues  Patient may require admission for pain control  EDP spoke to Jamelle Rushing PA for orthopedics who ias aware  Physical Exam  BP (!) 136/96 (BP Location: Left Leg)   Pulse 94   Temp 97.8 F (36.6 C) (Oral)   Resp 17   Ht 5\' 7"  (1.702 m)   Wt 80 kg   SpO2 100%   BMI 27.62 kg/m   Physical Exam  Procedures  Procedures  ED Course / MDM    Medical Decision Making Amount and/or Complexity of Data Reviewed Labs: ordered. Radiology: ordered.  Risk Prescription drug management. Decision regarding hospitalization.   Patient's labs reviewed.  He has a mild leukocytosis.  He also has some trending upwards of his creatinine and BUN suggestive again of mild developing prerenal injury.  I have ordered normal saline for mild hyponatremia and for renal hydration.  Additional IV fentanyl ordered as the patient is continuing to have complaint of intractable pain.  I think he continue with his oral antibiotic prophylaxis; I do not see clear evidence of sepsis, the patient has chronic osteomyelitis with an open wound.  The plan would be for hospital admission tonight for pain control with anticipated OR and amputation in the morning.  NPO at midnight.       Terald Sleeper, MD 08/18/22 [phone]

## 2022-08-18 NOTE — ED Provider Notes (Signed)
[facility] Provider Note  CSN: [id number] Arrival date & time: 08/18/22 1444  Chief Complaint(s) Wound Infection  HPI Caleb [name] is a 72 y.o. male history of CHF, died 20s, hyperlipidemia, hypertension presenting to the emergency department with right foot pain.  Patient has had chronic right foot osteomyelitis with multiple revisions.  He is actually scheduled for a right foot amputation tomorrow.  He just reports worsening pain.  No fevers or chills.  He reports that his pain has not been adequately controlled at home.  No nausea or vomiting.  No other new symptoms.   Past Medical History Past Medical History:  Diagnosis Date   Carotid artery occlusion    CHF (congestive heart failure) (HCC)    Diabetes mellitus without complication (HCC)    Type II   Dyslipidemia 09/27/2012   Erectile dysfunction 09/27/2012   GERD (gastroesophageal reflux disease)    Hypercholesteremia    Hyperlipidemia 08/12/2012   Hypertension    Hyponatremia 08/14/2012   MGUS (monoclonal gammopathy of unknown significance)    Neuropathy    Pancreatitis    Pancreatitis, acute 09/27/2012   Peripheral vascular disease (HCC)    S/P 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 Excell Seltzer & Bartle   Severe aortic stenosis    Vitamin D deficiency 06/23/2015   Patient Active Problem List   Diagnosis Date Noted   Osteomyelitis of right lower extremity (HCC) 08/18/2022   Ganglion of right ankle and foot 08/12/2022   AKI (acute kidney injury) (HCC) 08/11/2022   Chronic osteomyelitis of left foot (HCC) 08/11/2022   Myoclonus 08/11/2022   Chronic osteomyelitis (HCC) 08/11/2022   Leukocytosis 08/03/2022   Anemia of chronic disease 08/03/2022   Heart failure with reduced ejection fraction (HCC) 08/03/2022   History of osteomyelitis 08/03/2022   Foot ulcer with necrosis of bone (HCC) 07/29/2022   Gastroenteritis  06/15/2022   Acute osteomyelitis of right calcaneus (HCC) 06/09/2022   Diabetic foot ulcer (HCC) 06/09/2022   SIRS (systemic inflammatory response syndrome) (HCC) 06/07/2022   Acute CHF (congestive heart failure) (HCC) 05/27/2022   Decubitus ulcer of right heel 05/27/2022   Nausea and vomiting 05/27/2022   Wild-type transthyretin-related (ATTR) amyloidosis (HCC) 04/26/2022   MGUS (monoclonal gammopathy of unknown significance) 02/15/2022   PAD (peripheral artery disease) (HCC) 01/11/2022   Non-ischemic cardiomyopathy (HCC) 01/11/2022   Stage III chronic kidney disease (HCC) 01/11/2022   Severe aortic stenosis 01/11/2022   S/P TAVR (transcatheter aortic valve replacement) 01/11/2022   Caries 11/12/2021   Teeth missing 11/12/2021   Retained tooth root 11/12/2021   Chronic apical periodontitis 11/12/2021   Chronic periodontitis 11/12/2021   Accretions on teeth 11/12/2021   Encounter for management of wound VAC 11/12/2021   Phobia of dental procedure 11/12/2021   Defective dental restoration 11/12/2021   Attrition, teeth excessive 11/12/2021   Torus mandibularis 11/12/2021   Diastema of teeth 11/12/2021   Encounter for preoperative dental examination 11/09/2021   Acute kidney injury superimposed on chronic kidney disease (HCC) 10/30/2021   Chronic systolic CHF (congestive heart failure) (HCC) 10/28/2021   Hypertensive urgency 10/28/2021   DM2 (diabetes mellitus, type 2) (HCC) 07/01/2021   Burn of first degree of multiple sites of unspecified wrist and hand, initial encounter 12/31/2020   Male erectile disorder (CODE) 12/18/2020   Encounter for sterilization 12/18/2020   Diabetic neuropathy (HCC) 09/11/2020   Low back pain, unspecified  09/11/2020   Unspecified kidney failure 09/11/2020   Unsteadiness on feet 09/11/2020   Status post amputation of lesser toe of left foot (HCC) 07/22/2020   Hypertensive heart disease with chronic combined systolic and diastolic congestive heart  failure (HCC) 07/22/2020   Carotid stenosis, asymptomatic 02/03/2020   Peripheral vascular disease (HCC)    Carotid artery stenosis 12/16/2019   Moderate aortic stenosis 12/02/2019   Critical ischemia of foot (HCC) 11/18/2019   Carotid artery disease (HCC) 08/23/2019   Critical limb ischemia with history of revascularization of same extremity (HCC) 08/02/2019   Diabetes mellitus without complication (HCC)    Cardiac murmur 02/22/2018   Vitamin D deficiency 06/23/2015   Pancreatitis, acute 09/27/2012   Dyslipidemia 09/27/2012   Erectile dysfunction 09/27/2012   Essential hypertension 09/27/2012   Obesity, unspecified 08/14/2012   Chronic hyponatremia 08/14/2012   Acute pancreatitis 08/12/2012   HTN (hypertension) 08/12/2012   Hyperlipidemia 08/12/2012   Metabolic acidosis 08/12/2012   Home Medication(s) Prior to Admission medications   Medication Sig Start Date End Date Taking? Authorizing Provider  acetaminophen (TYLENOL) 500 MG tablet Take 1 tablet (500 mg total) by mouth every 8 (eight) hours as needed for moderate pain. 08/15/22 08/15/23 Yes Leroy Sea, MD  amoxicillin-clavulanate (AUGMENTIN) 500-125 MG tablet Take 1 tablet by mouth 2 (two) times daily. 08/15/22  Yes Leroy Sea, MD  apixaban (ELIQUIS) 5 MG TABS tablet Take 1 tablet (5 mg total) by mouth 2 (two) times daily. 08/04/22  Yes Danford, Earl Lites, MD  aspirin EC 81 MG tablet Take 81 mg by mouth in the morning.   Yes [provider]  atorvastatin (LIPITOR) 40 MG tablet Take 1 tablet (40 mg total) by mouth in the morning. 08/15/22  Yes Leroy Sea, MD  carvedilol (COREG) 3.125 MG tablet Take 1 tablet (3.125 mg total) by mouth 2 (two) times daily with a meal. 08/16/22  Yes Leroy Sea, MD  Cholecalciferol (VITAMIN D-3) 25 MCG (1000 UT) CAPS Take 1,000 Units by mouth daily.   Yes [provider]  ferrous sulfate 325 (65 FE) MG tablet Take 325 mg by mouth daily with breakfast.   Yes  [provider]  furosemide (LASIX) 80 MG tablet Take 1 tablet (80 mg total) by mouth daily. And 1 tablet (40 mg) in the evening 08/15/22  Yes Leroy Sea, MD  gabapentin (NEURONTIN) 300 MG capsule Take 1 capsule (300 mg total) by mouth 3 (three) times daily. 08/15/22  Yes Leroy Sea, MD  hydrALAZINE (APRESOLINE) 10 MG tablet Take 1 tablet (10 mg total) by mouth 3 (three) times daily. 07/25/22 08/24/22 Yes Hoy Register, MD  insulin NPH-regular Human (70-30) 100 UNIT/ML injection Inject 7-8 Units into the skin See admin instructions. Inject 8 units into the skin before breakfast and 7 units before the evening meal   Yes [provider]  isosorbide mononitrate (IMDUR) 30 MG 24 hr tablet Take 0.5 tablets (15 mg total) by mouth daily. 08/15/22  Yes Leroy Sea, MD  ondansetron (ZOFRAN) 4 MG tablet Take 1 tablet (4 mg total) by mouth every 8 (eight) hours as needed for nausea or vomiting. 07/25/22  Yes Newlin, Odette Horns, MD  polyethylene glycol (MIRALAX / GLYCOLAX) 17 g packet Take 17 g by mouth daily. Patient taking differently: Take 17 g by mouth daily as needed for mild constipation. 06/02/22  Yes Osvaldo Shipper, MD  vitamin B-12 (CYANOCOBALAMIN) 500 MCG tablet Take 500 mcg by mouth in the morning. 08/24/21  Yes [provider]  Continuous Blood Gluc Sensor (FREESTYLE LIBRE 14 DAY SENSOR) MISC Inject 1 Device into the skin every 14 (fourteen) days.    [provider]  PRESCRIPTION MEDICATION Inject 0.1-1 mLs as directed See admin instructions. Tri-Mix Standard Strength 5 ml. Formula: Prostaglandin 10 mcg/ml, Papaverine 30 mg/ml, Phentolamine 1 mg/ml. Inject 0.1 ml into side of penis as directed as needed (to be injected immediately before sexual intercourse) may increase the dose by 0.1 ml every 48 hours to achieve an erection. Max dose 1 ml. Patient not taking: Reported on 08/18/2022    [provider]                                                                                                                                     Past Surgical History Past Surgical History:  Procedure Laterality Date   ABDOMINAL AORTOGRAM W/LOWER EXTREMITY N/A 08/08/2019   Procedure: ABDOMINAL AORTOGRAM W/LOWER EXTREMITY;  Surgeon: Runell Gess, MD;  Location: [facility] INVASIVE CV LAB;  Service: Cardiovascular;  Laterality: N/A;   ABDOMINAL AORTOGRAM W/LOWER EXTREMITY N/A 11/18/2019   Procedure: ABDOMINAL AORTOGRAM W/LOWER EXTREMITY;  Surgeon: Runell Gess, MD;  Location: [facility] INVASIVE CV LAB;  Service: Cardiovascular;  Laterality: N/A;   ABDOMINAL AORTOGRAM W/LOWER EXTREMITY N/A 06/13/2022   Procedure: ABDOMINAL AORTOGRAM W/LOWER EXTREMITY;  Surgeon: Chuck Hint, MD;  Location: [facility] INVASIVE CV LAB;  Service: Cardiovascular;  Laterality: N/A;   APPLICATION OF WOUND VAC  06/24/2022   Procedure: APPLICATION OF WOUND VAC;  Surgeon: Nadara Mustard, MD;  Location: [facility] OR;  Service: Orthopedics;;   CARDIAC CATHETERIZATION     COLONOSCOPY  12 years ago   in [place] clinic= normal exam per pt   ENDARTERECTOMY FEMORAL Right 06/17/2022   Procedure: SUPERFICIAL ENDARTERECTOMY OF COMMON FEMORAL ARTERY;  Surgeon: Victorino Sparrow, MD;  Location: [facility] OR;  Service: Vascular;  Laterality: Right;   FEMORAL-TIBIAL BYPASS GRAFT Right 06/17/2022   Procedure: RIGHT FEMORAL- ANTERIOR TIBIAL ARTERY BYPASS;  Surgeon: Victorino Sparrow, MD;  Location: [facility] OR;  Service: Vascular;  Laterality: Right;   I & D EXTREMITY Right 06/24/2022   Procedure: RIGHT PARTIAL CALCANEUS EXCISION;  Surgeon: Nadara Mustard, MD;  Location: [facility] OR;  Service: Orthopedics;  Laterality: Right;   I & D EXTREMITY Right 07/29/2022   Procedure: RIGHT PARTIAL CALCANEUS EXCISION;  Surgeon: Nadara Mustard, MD;  Location: [facility] OR;  Service: Orthopedics;  Laterality: Right;   INTRAOPERATIVE TRANSTHORACIC ECHOCARDIOGRAM N/A 01/11/2022   Procedure: INTRAOPERATIVE TRANSTHORACIC ECHOCARDIOGRAM;  Surgeon: Tonny Bollman, MD;   Location: [facility] INVASIVE CV LAB;  Service: Open Heart Surgery;  Laterality: N/A;   KNEE SURGERY     MULTIPLE EXTRACTIONS WITH ALVEOLOPLASTY N/A 12/09/2021   Procedure: MULTIPLE EXTRACTION;  Surgeon: Sharman Cheek, DMD;  Location: [facility] OR;  Service: Dentistry;  Laterality: N/A;   PERIPHERAL VASCULAR INTERVENTION Right 08/08/2019   Procedure: PERIPHERAL  VASCULAR INTERVENTION;  Surgeon: Runell Gess, MD;  Location: [facility] INVASIVE CV LAB;  Service: Cardiovascular;  Laterality: Right;   PERIPHERAL VASCULAR INTERVENTION Left 11/18/2019   Procedure: PERIPHERAL VASCULAR INTERVENTION;  Surgeon: Runell Gess, MD;  Location: [facility]VAS[facility] CV LAB;  Service: Cardiovascular;  Laterality: Left;  left popliteal artery   RIGHT/LEFT HEART CATH AND CORONARY ANGIOGRAPHY N/A 10/29/2021   Procedure: RIGHT/LEFT HEART CATH AND CORONARY ANGIOGRAPHY;  Surgeon: Kathleene Hazel, MD;  Location: [facility]VASIVE CV LAB;  Service: Cardiovascular;  Laterality: N/A;   TRANSCAROTID ARTERY REVASCULARIZATION  Left 12/16/2019   Procedure: TRANSCAROTID ARTERY REVASCULARIZATION;  Surgeon: Sherren Kerns, MD;  Location: [facility] OR;  Service: Vascular;  Laterality: Left;   TRANSCAROTID ARTERY REVASCULARIZATION  Right 02/03/2020   Procedure: RIGHT TRANSCAROTID ARTERY REVASCULARIZATION;  Surgeon: Sherren Kerns, MD;  Location: [facility] OR;  Service: Vascular;  Laterality: Right;   TRANSCATHETER AORTIC VALVE REPLACEMENT, TRANSFEMORAL N/A 01/11/2022   Procedure: Transcatheter Aortic Valve Replacement, Transfemoral;  Surgeon: Tonny Bollman, MD;  Location: [facility] INVASIVE CV LAB;  Service: Open Heart Surgery;  Laterality: N/A;   ULTRASOUND GUIDANCE FOR VASCULAR ACCESS Right 12/16/2019   Procedure: ULTRASOUND GUIDANCE FOR VASCULAR ACCESS;  Surgeon: Sherren Kerns, MD;  Location: [facility] OR;  Service: Vascular;  Laterality: Right;   ULTRASOUND GUIDANCE FOR VASCULAR ACCESS Right 02/03/2020   Procedure: ULTRASOUND GUIDANCE FOR VASCULAR ACCESS;   Surgeon: Sherren Kerns, MD;  Location: [facility] OR;  Service: Vascular;  Laterality: Right;   VASECTOMY     VEIN HARVEST Right 06/17/2022   Procedure: IPSILATERAL RIGHT GREATER SAPHENOUS VEIN HARVEST;  Surgeon: Victorino Sparrow, MD;  Location: [facility] OR;  Service: Vascular;  Laterality: Right;   Family History Family History  Problem Relation Age of Onset   CAD Father    Hypertension Father    Alcohol abuse Father        Cause of death   Diabetes Mother    Colon polyps Mother    CAD Brother 75       CABG   Colon cancer Neg Hx    Esophageal cancer Neg Hx    Rectal cancer Neg Hx    Stomach cancer Neg Hx     Social History Social History   Tobacco Use   Smoking status: Never    Passive exposure: Never   Smokeless tobacco: Never  Vaping Use   Vaping Use: Never used  Substance Use Topics   Alcohol use: Yes    Comment: occasional   Drug use: No   Allergies Codeine, Percocet [oxycodone-acetaminophen], and Tramadol hcl  Review of Systems Review of Systems  All other systems reviewed and are negative.   Physical Exam Vital Signs  I have reviewed the triage vital signs BP 107/75 (BP Location: Right Arm)   Pulse 92   Temp 98 F (36.7 C)   Resp 18   Ht 5\' 7"  (1.702 m)   Wt 80 kg   SpO2 94%   BMI 27.62 kg/m  Physical Exam Vitals and nursing note reviewed.  Constitutional:      General: He is not in acute distress.    Appearance: Normal appearance.  HENT:     Mouth/Throat:     Mouth: Mucous membranes are moist.  Eyes:     Conjunctiva/sclera: Conjunctivae normal.  Cardiovascular:     Rate and Rhythm: Normal rate and regular rhythm.  Pulmonary:     Effort: Pulmonary effort is normal. No respiratory distress.  Breath sounds: Normal breath sounds.  Abdominal:     General: Abdomen is flat.     Palpations: Abdomen is soft.     Tenderness: There is no abdominal tenderness.  Musculoskeletal:     Comments: Chronic right foot deformity  Skin:    General: Skin is  warm and dry.     Capillary Refill: Capillary refill takes less than 2 seconds.     Comments: Chronic wound to the heel of the right foot with exposed muscle and bone without obvious expressible purulence or surrounding erythema  Neurological:     Mental Status: He is alert and oriented to person, place, and time. Mental status is at baseline.  Psychiatric:        Mood and Affect: Mood normal.        Behavior: Behavior normal.     ED Results and Treatments Labs (all labs ordered are listed, but only abnormal results are displayed) Labs Reviewed  COMPREHENSIVE METABOLIC PANEL - Abnormal; Notable for the following components:      Result Value   Sodium 129 (*)    Chloride 92 (*)    Glucose, Bld 147 (*)    BUN 39 (*)    Creatinine, Ser 2.15 (*)    Calcium 8.2 (*)    Albumin 2.1 (*)    GFR, Estimated 32 (*)    All other components within normal limits  CBC WITH DIFFERENTIAL/PLATELET - Abnormal; Notable for the following components:   WBC 12.1 (*)    RBC 2.93 (*)    Hemoglobin 7.9 (*)    HCT 24.9 (*)    RDW 15.9 (*)    Platelets 516 (*)    Neutro Abs 9.0 (*)    Monocytes Absolute 1.3 (*)    All other components within normal limits  CBC WITH DIFFERENTIAL/PLATELET - Abnormal; Notable for the following components:   WBC 11.0 (*)    RBC 3.47 (*)    Hemoglobin 9.2 (*)    HCT 29.1 (*)    RDW 16.1 (*)    Platelets 552 (*)    Neutro Abs 7.8 (*)    Monocytes Absolute 1.1 (*)    All other components within normal limits  COMPREHENSIVE METABOLIC PANEL - Abnormal; Notable for the following components:   Sodium 131 (*)    Chloride 94 (*)    Glucose, Bld 154 (*)    BUN 33 (*)    Creatinine, Ser 1.94 (*)    Total Protein 8.2 (*)    Albumin 2.4 (*)    Alkaline Phosphatase 128 (*)    GFR, Estimated 36 (*)    All other components within normal limits  PROTIME-INR - Abnormal; Notable for the following components:   Prothrombin Time 17.3 (*)    INR 1.4 (*)    All other components  within normal limits  GLUCOSE, CAPILLARY - Abnormal; Notable for the following components:   Glucose-Capillary 148 (*)    All other components within normal limits  SURGICAL PCR SCREEN  MAGNESIUM  URINALYSIS, COMPLETE (UACMP) WITH MICROSCOPIC  CREATININE, URINE, RANDOM  SODIUM, URINE, RANDOM  TYPE AND SCREEN  PREPARE RBC (CROSSMATCH)  Radiology DG Foot Complete Right  Result Date: 08/18/2022 CLINICAL DATA:  Right foot pain. EXAM: RIGHT FOOT COMPLETE - 3+ VIEW COMPARISON:  June 07, 2022. FINDINGS: Status post amputation of posterior calcaneus. There is some irregularity involving the residual portion of the anterior calcaneus which may be postsurgical in etiology, but osteomyelitis cannot be excluded. Vascular calcifications are noted. No other fracture or dislocation is noted. IMPRESSION: Status post surgical amputation of posterior calcaneus. Irregularity is seen involving residual portion of anterior calcaneus which may be postsurgical in etiology, but osteomyelitis cannot be excluded. Electronically Signed   By: Lupita Raider M.D.   On: 08/18/2022 16:05    Pertinent labs & imaging results that were available during my care of the patient were reviewed by me and considered in my medical decision making (see MDM for details).  Medications Ordered in ED Medications  naloxone (NARCAN) injection 0.4 mg (has no administration in time range)  HYDROmorphone (DILAUDID) injection 0.5 mg (0.5 mg Intravenous Given 08/19/22 0615)  acetaminophen (TYLENOL) tablet 650 mg (has no administration in time range)    Or  acetaminophen (TYLENOL) suppository 650 mg (has no administration in time range)  melatonin tablet 3 mg (3 mg Oral Given 08/18/22 2152)  ondansetron (ZOFRAN) injection 4 mg (has no administration in time range)  amoxicillin-clavulanate (AUGMENTIN) 500-125 MG per tablet 1  tablet (1 tablet Oral Given 08/18/22 2245)  lactated ringers infusion ( Intravenous Infusion Verify 08/19/22 0311)  insulin aspart (novoLOG) injection 0-6 Units ( Subcutaneous Not Given 08/19/22 0612)  povidone-iodine 10 % swab 2 Application (has no administration in time range)  ceFAZolin (ANCEF) IVPB 2g/100 mL premix (has no administration in time range)  tranexamic acid (CYKLOKAPRON) IVPB 1,000 mg (has no administration in time range)  tranexamic acid (CYKLOKAPRON) 2,000 mg in sodium chloride 0.9 % 50 mL Topical Application (has no administration in time range)  0.9 %  sodium chloride infusion (Manually program via Guardrails IV Fluids) (has no administration in time range)  ondansetron (ZOFRAN) injection 4 mg (4 mg Intravenous Given 08/18/22 1557)  morphine (PF) 4 MG/ML injection 4 mg (4 mg Intravenous Given 08/18/22 1558)  sodium chloride 0.9 % bolus 1,000 mL (0 mLs Intravenous Stopped 08/18/22 1904)  fentaNYL (SUBLIMAZE) injection 50 mcg (50 mcg Intravenous Given 08/18/22 2013)  sodium chloride 0.9 % bolus 1,000 mL (0 mLs Intravenous Stopped 08/18/22 2107)  chlorhexidine (HIBICLENS) 4 % liquid 4 Application (4 Applications Topical Given 08/19/22 0554)                                                                                                                                     Procedures Procedures  (including critical care time)  Medical Decision Making / ED Course   MDM:  72 year old male presenting to the emergency department with right foot pain.  Suspect related to multiple previous surgeries and probably chronic osteomyelitis.  Patient actually having his foot  amputated tomorrow.  X-ray without evidence of acute interval change.  Will check basic labs and treat pain.  Discussed with Earney Hamburg, PA the orthopedic PA.  He will let Dr. Lajoyce Corners know the patient is here.  Patient may end up needing to be admitted for pain control prior to his surgery tomorrow.  Signed out to oncoming  provider pending reassessment and labs.      Additional history obtained: -Additional history obtained from ems -External records from outside source obtained and reviewed including: Chart review including previous notes, labs, imaging, consultation notes including previous orthopedic notes   Lab Tests: -I ordered, reviewed, and interpreted labs.   The pertinent results include:   Labs Reviewed  COMPREHENSIVE METABOLIC PANEL - Abnormal; Notable for the following components:      Result Value   Sodium 129 (*)    Chloride 92 (*)    Glucose, Bld 147 (*)    BUN 39 (*)    Creatinine, Ser 2.15 (*)    Calcium 8.2 (*)    Albumin 2.1 (*)    GFR, Estimated 32 (*)    All other components within normal limits  CBC WITH DIFFERENTIAL/PLATELET - Abnormal; Notable for the following components:   WBC 12.1 (*)    RBC 2.93 (*)    Hemoglobin 7.9 (*)    HCT 24.9 (*)    RDW 15.9 (*)    Platelets 516 (*)    Neutro Abs 9.0 (*)    Monocytes Absolute 1.3 (*)    All other components within normal limits  CBC WITH DIFFERENTIAL/PLATELET - Abnormal; Notable for the following components:   WBC 11.0 (*)    RBC 3.47 (*)    Hemoglobin 9.2 (*)    HCT 29.1 (*)    RDW 16.1 (*)    Platelets 552 (*)    Neutro Abs 7.8 (*)    Monocytes Absolute 1.1 (*)    All other components within normal limits  COMPREHENSIVE METABOLIC PANEL - Abnormal; Notable for the following components:   Sodium 131 (*)    Chloride 94 (*)    Glucose, Bld 154 (*)    BUN 33 (*)    Creatinine, Ser 1.94 (*)    Total Protein 8.2 (*)    Albumin 2.4 (*)    Alkaline Phosphatase 128 (*)    GFR, Estimated 36 (*)    All other components within normal limits  PROTIME-INR - Abnormal; Notable for the following components:   Prothrombin Time 17.3 (*)    INR 1.4 (*)    All other components within normal limits  GLUCOSE, CAPILLARY - Abnormal; Notable for the following components:   Glucose-Capillary 148 (*)    All other components within  normal limits  SURGICAL PCR SCREEN  MAGNESIUM  URINALYSIS, COMPLETE (UACMP) WITH MICROSCOPIC  CREATININE, URINE, RANDOM  SODIUM, URINE, RANDOM  TYPE AND SCREEN  PREPARE RBC (CROSSMATCH)    Notable for mild leukocytosis and hyponatremia  EK    Imaging Studies ordered: I ordered imaging studies including XR foot On my interpretation imaging demonstrates chronic abnormalities  I independently visualized and interpreted imaging. I agree with the radiologist interpretation   Medicines ordered and prescription drug management: Meds ordered this encounter  Medications   ondansetron (ZOFRAN) injection 4 mg   morphine (PF) 4 MG/ML injection 4 mg   sodium chloride 0.9 % bolus 1,000 mL   fentaNYL (SUBLIMAZE) injection 50 mcg   sodium chloride 0.9 % bolus 1,000 mL   naloxone (NARCAN)  injection 0.4 mg   HYDROmorphone (DILAUDID) injection 0.5 mg   OR Linked Order Group    acetaminophen (TYLENOL) tablet 650 mg    acetaminophen (TYLENOL) suppository 650 mg   melatonin tablet 3 mg   ondansetron (ZOFRAN) injection 4 mg   amoxicillin-clavulanate (AUGMENTIN) 500-125 MG per tablet 1 tablet   lactated ringers infusion   insulin aspart (novoLOG) injection 0-6 Units    Order Specific Question:   Correction coverage:    Answer:   Very Sensitive (ESRD/Dialysis)    Order Specific Question:   CBG < 70:    Answer:   Implement Hypoglycemia Standing Orders and refer to Hypoglycemia Standing Orders sidebar report    Order Specific Question:   CBG 70 - 120:    Answer:   0 units    Order Specific Question:   CBG 121 - 150:    Answer:   0 units    Order Specific Question:   CBG 151 - 200:    Answer:   1 unit    Order Specific Question:   CBG 201-250:    Answer:   2 units    Order Specific Question:   CBG 251-300:    Answer:   3 units    Order Specific Question:   CBG 301-350:    Answer:   4 units    Order Specific Question:   CBG 351-400:    Answer:   5 units    Order Specific Question:   CBG  > 400    Answer:   Give 6 units and call MD   chlorhexidine (HIBICLENS) 4 % liquid 4 Application   povidone-iodine 10 % swab 2 Application   ceFAZolin (ANCEF) IVPB 2g/100 mL premix    Order Specific Question:   Indication:    Answer:   Surgical Prophylaxis   tranexamic acid (CYKLOKAPRON) IVPB 1,000 mg   tranexamic acid (CYKLOKAPRON) 2,000 mg in sodium chloride 0.9 % 50 mL Topical Application   0.9 %  sodium chloride infusion (Manually program via Guardrails IV Fluids)    -I have reviewed the patients home medicines and have made adjustments as needed   Consultations Obtained: I requested consultation with the orthopedic team,  and discussed lab and imaging findings as well as pertinent plan - they recommend: they are aware patient is here   Cardiac Monitoring: The patient was maintained on a cardiac monitor.  I personally viewed and interpreted the cardiac monitored which showed an underlying rhythm of: NSR  Reevaluation: After the interventions noted above, I reevaluated the patient and found that their symptoms have improved  Co morbidities that complicate the patient evaluation  Past Medical History:  Diagnosis Date   Carotid artery occlusion    CHF (congestive heart failure) (HCC)    Diabetes mellitus without complication (HCC)    Type II   Dyslipidemia 09/27/2012   Erectile dysfunction 09/27/2012   GERD (gastroesophageal reflux disease)    Hypercholesteremia    Hyperlipidemia 08/12/2012   Hypertension    Hyponatremia 08/14/2012   MGUS (monoclonal gammopathy of unknown significance)    Neuropathy    Pancreatitis    Pancreatitis, acute 09/27/2012   Peripheral vascular disease (HCC)    S/P 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 Excell Seltzer & Bartle   Severe aortic stenosis    Vitamin D deficiency 06/23/2015      Dispostion: Disposition decision including need for hospitalization  was  considered, and patient disposition pending at time of sign out.    Final Clinical Impression(s) / ED Diagnoses Final diagnoses:  Chronic osteomyelitis (HCC)  Hyponatremia     This chart was dictated using voice recognition software.  Despite best efforts to proofread,  errors can occur which can change the documentation meaning.    Lonell Grandchild, MD 08/19/22 [phone]

## 2022-08-18 NOTE — Telephone Encounter (Signed)
This is the pt that is sch for a BKA tomorrow correct and is on his way to the ER now?

## 2022-08-18 NOTE — Telephone Encounter (Signed)
Beth with [facility] would like a call back to discuss some instructions for eating and holding medication for patient.  Cb# [id number].  Please advise.  Thank you.

## 2022-08-19 ENCOUNTER — Encounter (HOSPITAL_COMMUNITY)
Admission: EM | Disposition: A | Discharge status: Rehabilitation Facility | Payer: Self-pay | Source: Home / Self Care | Attending: Family Medicine

## 2022-08-19 ENCOUNTER — Inpatient Hospital Stay (HOSPITAL_COMMUNITY): Admission: RE | Admit: 2022-08-19 | Payer: Medicare HMO | Source: Home / Self Care | Admitting: Orthopedic Surgery

## 2022-08-19 ENCOUNTER — Ambulatory Visit (HOSPITAL_COMMUNITY): Payer: Medicare HMO

## 2022-08-19 ENCOUNTER — Ambulatory Visit: Payer: No Typology Code available for payment source

## 2022-08-19 ENCOUNTER — Other Ambulatory Visit: Payer: Self-pay

## 2022-08-19 ENCOUNTER — Ambulatory Visit (HOSPITAL_COMMUNITY): Admission: RE | Admit: 2022-08-19 | Payer: Medicare HMO | Source: Ambulatory Visit

## 2022-08-19 ENCOUNTER — Inpatient Hospital Stay (HOSPITAL_COMMUNITY): Payer: No Typology Code available for payment source | Admitting: Anesthesiology

## 2022-08-19 ENCOUNTER — Encounter (HOSPITAL_COMMUNITY): Payer: Self-pay | Admitting: Internal Medicine

## 2022-08-19 DIAGNOSIS — I110 Hypertensive heart disease with heart failure: Secondary | ICD-10-CM

## 2022-08-19 DIAGNOSIS — I25119 Atherosclerotic heart disease of native coronary artery with unspecified angina pectoris: Secondary | ICD-10-CM | POA: Insufficient documentation

## 2022-08-19 DIAGNOSIS — I739 Peripheral vascular disease, unspecified: Secondary | ICD-10-CM | POA: Diagnosis not present

## 2022-08-19 DIAGNOSIS — N179 Acute kidney failure, unspecified: Secondary | ICD-10-CM | POA: Diagnosis not present

## 2022-08-19 DIAGNOSIS — M869 Osteomyelitis, unspecified: Secondary | ICD-10-CM | POA: Diagnosis not present

## 2022-08-19 DIAGNOSIS — Z794 Long term (current) use of insulin: Secondary | ICD-10-CM

## 2022-08-19 DIAGNOSIS — E1169 Type 2 diabetes mellitus with other specified complication: Secondary | ICD-10-CM

## 2022-08-19 DIAGNOSIS — I509 Heart failure, unspecified: Secondary | ICD-10-CM

## 2022-08-19 DIAGNOSIS — I5022 Chronic systolic (congestive) heart failure: Secondary | ICD-10-CM | POA: Diagnosis not present

## 2022-08-19 HISTORY — PX: AMPUTATION: SHX166

## 2022-08-19 LAB — CBC WITH DIFFERENTIAL/PLATELET
Abs Immature Granulocytes: 0.03 10*3/uL (ref 0.00–0.07)
Basophils Absolute: 0.1 10*3/uL (ref 0.0–0.1)
Basophils Relative: 1 %
Eosinophils Absolute: 0.1 10*3/uL (ref 0.0–0.5)
Eosinophils Relative: 1 %
HCT: 29.1 % — ABNORMAL LOW (ref 39.0–52.0)
Hemoglobin: 9.2 g/dL — ABNORMAL LOW (ref 13.0–17.0)
Immature Granulocytes: 0 %
Lymphocytes Relative: 18 %
Lymphs Abs: 2 10*3/uL (ref 0.7–4.0)
MCH: 26.5 pg (ref 26.0–34.0)
MCHC: 31.6 g/dL (ref 30.0–36.0)
MCV: 83.9 fL (ref 80.0–100.0)
Monocytes Absolute: 1.1 10*3/uL — ABNORMAL HIGH (ref 0.1–1.0)
Monocytes Relative: 10 %
Neutro Abs: 7.8 10*3/uL — ABNORMAL HIGH (ref 1.7–7.7)
Neutrophils Relative %: 70 %
Platelets: 552 10*3/uL — ABNORMAL HIGH (ref 150–400)
RBC: 3.47 MIL/uL — ABNORMAL LOW (ref 4.22–5.81)
RDW: 16.1 % — ABNORMAL HIGH (ref 11.5–15.5)
WBC: 11 10*3/uL — ABNORMAL HIGH (ref 4.0–10.5)
nRBC: 0 % (ref 0.0–0.2)

## 2022-08-19 LAB — GLUCOSE, CAPILLARY
Glucose-Capillary: 148 mg/dL — ABNORMAL HIGH (ref 70–99)
Glucose-Capillary: 166 mg/dL — ABNORMAL HIGH (ref 70–99)
Glucose-Capillary: 131 mg/dL — ABNORMAL HIGH (ref 70–99)
Glucose-Capillary: 134 mg/dL — ABNORMAL HIGH (ref 70–99)
Glucose-Capillary: 135 mg/dL — ABNORMAL HIGH (ref 70–99)
Glucose-Capillary: 142 mg/dL — ABNORMAL HIGH (ref 70–99)

## 2022-08-19 LAB — COMPREHENSIVE METABOLIC PANEL
ALT: 33 U/L (ref 0–44)
AST: 36 U/L (ref 15–41)
Albumin: 2.4 g/dL — ABNORMAL LOW (ref 3.5–5.0)
Alkaline Phosphatase: 128 U/L — ABNORMAL HIGH (ref 38–126)
Anion gap: 12 (ref 5–15)
BUN: 33 mg/dL — ABNORMAL HIGH (ref 8–23)
CO2: 25 mmol/L (ref 22–32)
Calcium: 8.9 mg/dL (ref 8.9–10.3)
Chloride: 94 mmol/L — ABNORMAL LOW (ref 98–111)
Creatinine, Ser: 1.94 mg/dL — ABNORMAL HIGH (ref 0.61–1.24)
GFR, Estimated: 36 mL/min — ABNORMAL LOW (ref >60)
Glucose, Bld: 154 mg/dL — ABNORMAL HIGH (ref 70–99)
Potassium: 4.5 mmol/L (ref 3.5–5.1)
Sodium: 131 mmol/L — ABNORMAL LOW (ref 135–145)
Total Bilirubin: 0.4 mg/dL (ref 0.3–1.2)
Total Protein: 8.2 g/dL — ABNORMAL HIGH (ref 6.5–8.1)

## 2022-08-19 LAB — PREPARE RBC (CROSSMATCH)

## 2022-08-19 LAB — TYPE AND SCREEN: DAT, IgG: POSITIVE

## 2022-08-19 LAB — SURGICAL PCR SCREEN
MRSA, PCR: NEGATIVE
Staphylococcus aureus: NEGATIVE

## 2022-08-19 LAB — MAGNESIUM: Magnesium: 2.4 mg/dL (ref 1.7–2.4)

## 2022-08-19 LAB — PROTIME-INR
INR: 1.4 — ABNORMAL HIGH (ref 0.8–1.2)
Prothrombin Time: 17.3 seconds — ABNORMAL HIGH (ref 11.4–15.2)

## 2022-08-19 LAB — BPAM RBC: Unit Type and Rh: 5100

## 2022-08-19 SURGERY — AMPUTATION BELOW KNEE
Anesthesia: Regional | Site: Knee | Laterality: Right

## 2022-08-19 MED ORDER — POVIDONE-IODINE 10 % EX SWAB
2.0000 | Freq: Once | CUTANEOUS | Status: AC
  Administered 2022-08-19: 2 via TOPICAL

## 2022-08-19 MED ORDER — GUAIFENESIN-DM 100-10 MG/5ML PO SYRP: 15.0000 mL | ORAL_SOLUTION | ORAL | Status: DC | PRN

## 2022-08-19 MED ORDER — PHENOL 1.4 % MT LIQD: 1.0000 | OROMUCOSAL | Status: DC | PRN

## 2022-08-19 MED ORDER — TRANEXAMIC ACID-NACL 1000-0.7 MG/100ML-% IV SOLN
1000.0000 mg | INTRAVENOUS | Status: DC
  Filled 2022-08-19: qty 100

## 2022-08-19 MED ORDER — POTASSIUM CHLORIDE CRYS ER 20 MEQ PO TBCR: 20.0000 meq | EXTENDED_RELEASE_TABLET | Freq: Every day | ORAL | Status: DC | PRN

## 2022-08-19 MED ORDER — HYDROCODONE-ACETAMINOPHEN 7.5-325 MG PO TABS
1.0000 | ORAL_TABLET | ORAL | Status: DC | PRN
  Administered 2022-08-20: 2 via ORAL
  Filled 2022-08-19: qty 2

## 2022-08-19 MED ORDER — ACETAMINOPHEN 500 MG PO TABS
1000.0000 mg | ORAL_TABLET | Freq: Three times a day (TID) | ORAL | Status: DC
  Administered 2022-08-19 – 2022-08-20 (×4): 1000 mg via ORAL
  Filled 2022-08-19 (×4): qty 2

## 2022-08-19 MED ORDER — PANTOPRAZOLE SODIUM 40 MG PO TBEC
40.0000 mg | DELAYED_RELEASE_TABLET | Freq: Every day | ORAL | Status: DC
  Administered 2022-08-20 – 2022-08-22 (×3): 40 mg via ORAL
  Filled 2022-08-19 (×3): qty 1

## 2022-08-19 MED ORDER — MAGNESIUM SULFATE 2 GM/50ML IV SOLN: 2.0000 g | Freq: Every day | INTRAVENOUS | Status: DC | PRN

## 2022-08-19 MED ORDER — PHENYLEPHRINE HCL-NACL 20-0.9 MG/250ML-% IV SOLN
INTRAVENOUS | Status: DC | PRN
  Administered 2022-08-19: 20 ug/min via INTRAVENOUS

## 2022-08-19 MED ORDER — INSULIN ASPART 100 UNIT/ML IJ SOLN: 0.0000 [IU] | Freq: Four times a day (QID) | INTRAMUSCULAR | Status: DC

## 2022-08-19 MED ORDER — MAGNESIUM CITRATE PO SOLN: 1.0000 | Freq: Once | ORAL | Status: DC | PRN

## 2022-08-19 MED ORDER — ALUM & MAG HYDROXIDE-SIMETH 200-200-20 MG/5ML PO SUSP: 15.0000 mL | ORAL | Status: DC | PRN

## 2022-08-19 MED ORDER — INSULIN ASPART 100 UNIT/ML IJ SOLN
0.0000 [IU] | Freq: Every day | INTRAMUSCULAR | Status: DC
  Administered 2022-08-21: 2 [IU] via SUBCUTANEOUS

## 2022-08-19 MED ORDER — ACETAMINOPHEN 325 MG PO TABS: 325.0000 mg | ORAL_TABLET | Freq: Four times a day (QID) | ORAL | Status: DC | PRN

## 2022-08-19 MED ORDER — DOCUSATE SODIUM 100 MG PO CAPS
100.0000 mg | ORAL_CAPSULE | Freq: Every day | ORAL | Status: DC
  Administered 2022-08-20 – 2022-08-22 (×3): 100 mg via ORAL
  Filled 2022-08-19 (×3): qty 1

## 2022-08-19 MED ORDER — BUPIVACAINE HCL (PF) 0.5 % IJ SOLN
INTRAMUSCULAR | Status: DC | PRN
  Administered 2022-08-19: 10 mL via PERINEURAL
  Administered 2022-08-19: 20 mL via PERINEURAL

## 2022-08-19 MED ORDER — GABAPENTIN 300 MG PO CAPS
300.0000 mg | ORAL_CAPSULE | Freq: Three times a day (TID) | ORAL | Status: DC
  Administered 2022-08-19 – 2022-08-22 (×10): 300 mg via ORAL
  Filled 2022-08-19 (×10): qty 1

## 2022-08-19 MED ORDER — LACTATED RINGERS IV SOLN: INTRAVENOUS | Status: DC

## 2022-08-19 MED ORDER — FENTANYL CITRATE (PF) 100 MCG/2ML IJ SOLN: 50.0000 ug | Freq: Once | INTRAMUSCULAR | Status: AC

## 2022-08-19 MED ORDER — POLYETHYLENE GLYCOL 3350 17 G PO PACK: 17.0000 g | PACK | Freq: Every day | ORAL | Status: DC | PRN

## 2022-08-19 MED ORDER — ASPIRIN 81 MG PO TBEC
81.0000 mg | DELAYED_RELEASE_TABLET | Freq: Every morning | ORAL | Status: DC
  Administered 2022-08-20 – 2022-08-22 (×3): 81 mg via ORAL
  Filled 2022-08-19 (×5): qty 1

## 2022-08-19 MED ORDER — ORAL CARE MOUTH RINSE: 15.0000 mL | Freq: Once | OROMUCOSAL | Status: AC

## 2022-08-19 MED ORDER — 0.9 % SODIUM CHLORIDE (POUR BTL) OPTIME
TOPICAL | Status: DC | PRN
  Administered 2022-08-19: 1000 mL

## 2022-08-19 MED ORDER — CHLORHEXIDINE GLUCONATE 0.12 % MT SOLN
OROMUCOSAL | Status: AC
  Administered 2022-08-19: 15 mL via OROMUCOSAL
  Filled 2022-08-19: qty 15

## 2022-08-19 MED ORDER — KETAMINE HCL 50 MG/5ML IJ SOSY
PREFILLED_SYRINGE | INTRAMUSCULAR | Status: AC
  Filled 2022-08-19: qty 5

## 2022-08-19 MED ORDER — PROPOFOL 500 MG/50ML IV EMUL
INTRAVENOUS | Status: DC | PRN
  Administered 2022-08-19: 25 ug/kg/min via INTRAVENOUS

## 2022-08-19 MED ORDER — MORPHINE SULFATE (PF) 2 MG/ML IV SOLN
0.5000 mg | INTRAVENOUS | Status: DC | PRN
  Administered 2022-08-20 (×6): 1 mg via INTRAVENOUS
  Filled 2022-08-19 (×6): qty 1

## 2022-08-19 MED ORDER — TRANEXAMIC ACID-NACL 1000-0.7 MG/100ML-% IV SOLN
INTRAVENOUS | Status: DC | PRN
  Administered 2022-08-19: 1000 mg via INTRAVENOUS

## 2022-08-19 MED ORDER — VITAMIN C 500 MG PO TABS
1000.0000 mg | ORAL_TABLET | Freq: Every day | ORAL | Status: DC
  Administered 2022-08-19 – 2022-08-22 (×4): 1000 mg via ORAL
  Filled 2022-08-19 (×5): qty 2

## 2022-08-19 MED ORDER — ONDANSETRON HCL 4 MG/2ML IJ SOLN
4.0000 mg | Freq: Four times a day (QID) | INTRAMUSCULAR | Status: DC | PRN
  Administered 2022-08-20: 4 mg via INTRAVENOUS
  Filled 2022-08-19: qty 2

## 2022-08-19 MED ORDER — INSULIN ASPART 100 UNIT/ML IJ SOLN
0.0000 [IU] | Freq: Three times a day (TID) | INTRAMUSCULAR | Status: DC
  Administered 2022-08-19 – 2022-08-20 (×2): 1 [IU] via SUBCUTANEOUS
  Administered 2022-08-20 (×2): 2 [IU] via SUBCUTANEOUS
  Administered 2022-08-21: 1 [IU] via SUBCUTANEOUS
  Administered 2022-08-21: 2 [IU] via SUBCUTANEOUS
  Administered 2022-08-22 (×2): 1 [IU] via SUBCUTANEOUS
  Administered 2022-08-22: 3 [IU] via SUBCUTANEOUS

## 2022-08-19 MED ORDER — FENTANYL CITRATE (PF) 100 MCG/2ML IJ SOLN
INTRAMUSCULAR | Status: AC
  Administered 2022-08-19: 50 ug via INTRAVENOUS
  Filled 2022-08-19: qty 2

## 2022-08-19 MED ORDER — MIDAZOLAM HCL 2 MG/2ML IJ SOLN
INTRAMUSCULAR | Status: AC
  Filled 2022-08-19: qty 2

## 2022-08-19 MED ORDER — CHLORHEXIDINE GLUCONATE 0.12 % MT SOLN: 15.0000 mL | Freq: Once | OROMUCOSAL | Status: AC

## 2022-08-19 MED ORDER — SODIUM CHLORIDE 0.9% IV SOLUTION: Freq: Once | INTRAVENOUS | Status: DC

## 2022-08-19 MED ORDER — HYDRALAZINE HCL 20 MG/ML IJ SOLN: 5.0000 mg | INTRAMUSCULAR | Status: DC | PRN

## 2022-08-19 MED ORDER — BISACODYL 5 MG PO TBEC: 5.0000 mg | DELAYED_RELEASE_TABLET | Freq: Every day | ORAL | Status: DC | PRN

## 2022-08-19 MED ORDER — TRANEXAMIC ACID 1000 MG/10ML IV SOLN
2000.0000 mg | INTRAVENOUS | Status: DC
  Filled 2022-08-19: qty 20

## 2022-08-19 MED ORDER — BUPIVACAINE LIPOSOME 1.3 % IJ SUSP
INTRAMUSCULAR | Status: DC | PRN
  Administered 2022-08-19: 6 mL via PERINEURAL
  Administered 2022-08-19: 4 mL via PERINEURAL

## 2022-08-19 MED ORDER — INSULIN ASPART 100 UNIT/ML IJ SOLN: 0.0000 [IU] | INTRAMUSCULAR | Status: DC | PRN

## 2022-08-19 MED ORDER — JUVEN PO PACK
1.0000 | PACK | Freq: Two times a day (BID) | ORAL | Status: DC
  Administered 2022-08-20 – 2022-08-22 (×6): 1 via ORAL
  Filled 2022-08-19 (×6): qty 1

## 2022-08-19 MED ORDER — FENTANYL CITRATE (PF) 250 MCG/5ML IJ SOLN
INTRAMUSCULAR | Status: AC
  Filled 2022-08-19: qty 5

## 2022-08-19 MED ORDER — LABETALOL HCL 5 MG/ML IV SOLN: 10.0000 mg | INTRAVENOUS | Status: DC | PRN

## 2022-08-19 MED ORDER — CEFAZOLIN SODIUM-DEXTROSE 2-4 GM/100ML-% IV SOLN
2.0000 g | INTRAVENOUS | Status: DC
  Filled 2022-08-19: qty 100

## 2022-08-19 MED ORDER — CEFAZOLIN SODIUM-DEXTROSE 2-3 GM-%(50ML) IV SOLR
INTRAVENOUS | Status: DC | PRN
  Administered 2022-08-19: 2 g via INTRAVENOUS

## 2022-08-19 MED ORDER — FENTANYL CITRATE (PF) 100 MCG/2ML IJ SOLN
INTRAMUSCULAR | Status: AC
  Filled 2022-08-19: qty 2

## 2022-08-19 MED ORDER — LACTATED RINGERS IV SOLN: INTRAVENOUS | Status: DC | PRN

## 2022-08-19 MED ORDER — ZINC SULFATE 220 (50 ZN) MG PO CAPS
220.0000 mg | ORAL_CAPSULE | Freq: Every day | ORAL | Status: DC
  Administered 2022-08-19 – 2022-08-22 (×4): 220 mg via ORAL
  Filled 2022-08-19 (×4): qty 1

## 2022-08-19 MED ORDER — OXYCODONE HCL 5 MG PO TABS: 5.0000 mg | ORAL_TABLET | ORAL | Status: DC | PRN

## 2022-08-19 MED ORDER — METOPROLOL TARTRATE 5 MG/5ML IV SOLN: 2.0000 mg | INTRAVENOUS | Status: DC | PRN

## 2022-08-19 MED ORDER — CHLORHEXIDINE GLUCONATE 4 % EX SOLN
60.0000 mL | Freq: Once | CUTANEOUS | Status: AC
  Administered 2022-08-19: 4 via TOPICAL
  Filled 2022-08-19: qty 60

## 2022-08-19 MED ORDER — SODIUM CHLORIDE 0.9 % IV SOLN: INTRAVENOUS | Status: DC

## 2022-08-19 MED ORDER — HYDROCODONE-ACETAMINOPHEN 5-325 MG PO TABS
1.0000 | ORAL_TABLET | ORAL | Status: DC | PRN
  Filled 2022-08-19: qty 2

## 2022-08-19 MED ORDER — ONDANSETRON HCL 4 MG/2ML IJ SOLN
INTRAMUSCULAR | Status: DC | PRN
  Administered 2022-08-19: 4 mg via INTRAVENOUS

## 2022-08-19 MED ORDER — LIDOCAINE 2% (20 MG/ML) 5 ML SYRINGE
INTRAMUSCULAR | Status: DC | PRN
  Administered 2022-08-19: 20 mg via INTRAVENOUS

## 2022-08-19 MED ORDER — ATORVASTATIN CALCIUM 40 MG PO TABS
40.0000 mg | ORAL_TABLET | Freq: Every day | ORAL | Status: DC
  Administered 2022-08-19 – 2022-08-21 (×3): 40 mg via ORAL
  Filled 2022-08-19 (×3): qty 1

## 2022-08-19 SURGICAL SUPPLY — 44 items
BAG COUNTER SPONGE SURGICOUNT (BAG) IMPLANT
BAG SPNG CNTER NS LX DISP (BAG)
BIT DRILL 3.2XOCPTL (BIT) ×2 IMPLANT
BIT DRL 3.2XOCPTL (BIT) ×1
BLADE SAW RECIP 87.9 MT (BLADE) ×2 IMPLANT
BLADE SURG 21 STRL SS (BLADE) ×2 IMPLANT
BNDG CMPR 5X6 CHSV STRCH STRL (GAUZE/BANDAGES/DRESSINGS)
BNDG COHESIVE 6X5 TAN ST LF (GAUZE/BANDAGES/DRESSINGS) IMPLANT
CANISTER PREVENA 45 (CANNISTER) IMPLANT
CANISTER WOUND CARE 500ML ATS (WOUND CARE) ×2 IMPLANT
COVER SURGICAL LIGHT HANDLE (MISCELLANEOUS) ×2 IMPLANT
CUFF TOURN SGL QUICK 34 (TOURNIQUET CUFF) ×1
CUFF TRNQT CYL 34X4.125X (TOURNIQUET CUFF) ×2 IMPLANT
DRAPE DERMATAC (DRAPES) IMPLANT
DRAPE INCISE IOBAN 66X45 STRL (DRAPES) ×2 IMPLANT
DRAPE U-SHAPE 47X51 STRL (DRAPES) ×2 IMPLANT
DRESSING PREVENA PLUS CUSTOM (GAUZE/BANDAGES/DRESSINGS) ×2 IMPLANT
DRILL BIT (BIT) ×1
DRSG PREVENA PLUS CUSTOM (GAUZE/BANDAGES/DRESSINGS) ×1
DURAPREP 26ML APPLICATOR (WOUND CARE) ×2 IMPLANT
ELECT REM PT RETURN 9FT ADLT (ELECTROSURGICAL) ×1
ELECTRODE REM PT RTRN 9FT ADLT (ELECTROSURGICAL) ×2 IMPLANT
GLOVE BIOGEL PI IND STRL 9 (GLOVE) ×2 IMPLANT
GLOVE SURG ORTHO 9.0 STRL STRW (GLOVE) ×2 IMPLANT
GOWN STRL REUS W/ TWL XL LVL3 (GOWN DISPOSABLE) ×4 IMPLANT
GOWN STRL REUS W/TWL XL LVL3 (GOWN DISPOSABLE) ×2
GRAFT SKIN WND MICRO 38 (Tissue) IMPLANT
KIT BASIN OR (CUSTOM PROCEDURE TRAY) ×2 IMPLANT
KIT TURNOVER KIT B (KITS) ×2 IMPLANT
MANIFOLD NEPTUNE II (INSTRUMENTS) ×2 IMPLANT
NS IRRIG 1000ML POUR BTL (IV SOLUTION) ×2 IMPLANT
PACK ORTHO EXTREMITY (CUSTOM PROCEDURE TRAY) ×2 IMPLANT
PAD ARMBOARD 7.5X6 YLW CONV (MISCELLANEOUS) ×2 IMPLANT
PREVENA RESTOR ARTHOFORM 46X30 (CANNISTER) ×2 IMPLANT
SPONGE T-LAP 18X18 ~~LOC~~+RFID (SPONGE) IMPLANT
STAPLER VISISTAT 35W (STAPLE) IMPLANT
STOCKINETTE IMPERVIOUS LG (DRAPES) ×2 IMPLANT
SUT ETHILON 2 0 PSLX (SUTURE) IMPLANT
SUT SILK 2 0 (SUTURE) ×1
SUT SILK 2-0 18XBRD TIE 12 (SUTURE) ×2 IMPLANT
SUT VIC AB 1 CTX 27 (SUTURE) ×4 IMPLANT
TOWEL GREEN STERILE (TOWEL DISPOSABLE) ×2 IMPLANT
TUBE CONNECTING 12X1/4 (SUCTIONS) ×2 IMPLANT
YANKAUER SUCT BULB TIP NO VENT (SUCTIONS) ×2 IMPLANT

## 2022-08-19 NOTE — Plan of Care (Signed)
Patient is resting in bed with normal vitals and respirations. No complaint of pain at the very moment. Patient is back on floor from OR after right below the knee amputation. Patient has wound vac at 125. Family at bedside. Plan of care is ongoing.  Problem: Education: Goal: Knowledge of the prescribed therapeutic regimen will improve Outcome: Progressing Goal: Ability to verbalize activity precautions or restrictions will improve Outcome: Progressing Goal: Understanding of discharge needs will improve Outcome: Progressing   Problem: Activity: Goal: Ability to perform//tolerate increased activity and mobilize with assistive devices will improve Outcome: Progressing   Problem: Clinical Measurements: Goal: Postoperative complications will be avoided or minimized Outcome: Progressing   Problem: Self-Care: Goal: Ability to meet self-care needs will improve Outcome: Progressing   Problem: Self-Concept: Goal: Ability to maintain and perform role responsibilities to the fullest extent possible will improve Outcome: Progressing   Problem: Pain Management: Goal: Pain level will decrease with appropriate interventions Outcome: Progressing   Problem: Skin Integrity: Goal: Demonstration of wound healing without infection will improve Outcome: Progressing   Problem: Education: Goal: Knowledge of General Education information will improve Description: Including pain rating scale, medication(s)/side effects and non-pharmacologic comfort measures Outcome: Progressing   Problem: Health Behavior/Discharge Planning: Goal: Ability to manage health-related needs will improve Outcome: Progressing   Problem: Clinical Measurements: Goal: Ability to maintain clinical measurements within normal limits will improve Outcome: Progressing Goal: Will remain free from infection Outcome: Progressing Goal: Diagnostic test results will improve Outcome: Progressing Goal: Respiratory complications will  improve Outcome: Progressing Goal: Cardiovascular complication will be avoided Outcome: Progressing   Problem: Activity: Goal: Risk for activity intolerance will decrease Outcome: Progressing   Problem: Nutrition: Goal: Adequate nutrition will be maintained Outcome: Progressing   Problem: Coping: Goal: Level of anxiety will decrease Outcome: Progressing   Problem: Elimination: Goal: Will not experience complications related to bowel motility Outcome: Progressing Goal: Will not experience complications related to urinary retention Outcome: Progressing   Problem: Pain Managment: Goal: General experience of comfort will improve Outcome: Progressing   Problem: Safety: Goal: Ability to remain free from injury will improve Outcome: Progressing   Problem: Skin Integrity: Goal: Risk for impaired skin integrity will decrease Outcome: Progressing   Problem: Education: Goal: Ability to describe self-care measures that may prevent or decrease complications (Diabetes Survival Skills Education) will improve Outcome: Progressing Goal: Individualized Educational Video(s) Outcome: Progressing   Problem: Coping: Goal: Ability to adjust to condition or change in health will improve Outcome: Progressing   Problem: Fluid Volume: Goal: Ability to maintain a balanced intake and output will improve Outcome: Progressing   Problem: Health Behavior/Discharge Planning: Goal: Ability to identify and utilize available resources and services will improve Outcome: Progressing Goal: Ability to manage health-related needs will improve Outcome: Progressing   Problem: Metabolic: Goal: Ability to maintain appropriate glucose levels will improve Outcome: Progressing   Problem: Nutritional: Goal: Maintenance of adequate nutrition will improve Outcome: Progressing Goal: Progress toward achieving an optimal weight will improve Outcome: Progressing   Problem: Skin Integrity: Goal: Risk for  impaired skin integrity will decrease Outcome: Progressing   Problem: Tissue Perfusion: Goal: Adequacy of tissue perfusion will improve Outcome: Progressing

## 2022-08-19 NOTE — Assessment & Plan Note (Signed)
Status post right below-knee amputation and application of Kerecis micrograft and Prevena and stump shrinker on 5/31 by Dr. Lajoyce Corners - PT/OT - Analgesics for post-op pain

## 2022-08-19 NOTE — Assessment & Plan Note (Signed)
Pressure soft - Hold Coreg, furosemide, hydralazine, Imdur

## 2022-08-19 NOTE — Anesthesia Procedure Notes (Signed)
Anesthesia Regional Block: Popliteal block   Pre-Anesthetic Checklist: , timeout performed,  Correct Patient, Correct Site, Correct Laterality,  Correct Procedure, Correct Position, site marked,  Risks and benefits discussed,  Surgical consent,  Pre-op evaluation,  At surgeon's request and post-op pain management  Laterality: Lower and Right  Prep: chloraprep       Needles:  Injection technique: Single-shot  Needle Type: Stimiplex     Needle Length: 10cm  Needle Gauge: 21     Additional Needles:   Procedures:,,,, ultrasound used (permanent image in chart),,   Motor weakness within 5 minutes.  Narrative:  Start time: 08/19/2022 12:55 PM End time: 08/19/2022 1:00 PM Injection made incrementally with aspirations every 5 mL.  Performed by: Personally  Anesthesiologist: Lewie Loron, MD  Additional Notes: Nerve located and needle positioned with direct ultrasound guidance. Good perineural spread. Patient tolerated well.

## 2022-08-19 NOTE — Assessment & Plan Note (Addendum)
-   Resume Eliquis - Continue atorvastatin and aspirin

## 2022-08-19 NOTE — Anesthesia Postprocedure Evaluation (Signed)
Anesthesia Post Note  Patient: Caleb [name]  Procedure(s) Performed: RIGHT BELOW KNEE AMPUTATION (Right: Knee)     Patient location during evaluation: PACU Anesthesia Type: Regional Level of consciousness: awake and alert Pain management: pain level controlled Vital Signs Assessment: post-procedure vital signs reviewed and stable Respiratory status: spontaneous breathing Cardiovascular status: stable Anesthetic complications: no   No notable events documented.  Last Vitals:  Vitals:   08/19/22 1415 08/19/22 1430  BP: 120/73 107/74  Pulse: 87 83  Resp: (!) 8 13  Temp:  36.6 C  SpO2: 91% 97%    Last Pain:  Vitals:   08/19/22 1430  TempSrc:   PainSc: 0-No pain                 Lewie Loron

## 2022-08-19 NOTE — Assessment & Plan Note (Signed)
-   Continue statin - Resume BB, Imdur

## 2022-08-19 NOTE — Transfer of Care (Signed)
Immediate Anesthesia Transfer of Care Note  Patient: Caleb [name]  Procedure(s) Performed: RIGHT BELOW KNEE AMPUTATION (Right: Knee)  Patient Location: PACU  Anesthesia Type:MAC and Regional  Level of Consciousness: awake  Airway & Oxygen Therapy: Patient Spontanous Breathing  Post-op Assessment: Report given to RN and Post -op Vital signs reviewed and stable  Post vital signs: Reviewed and stable  Last Vitals:  Vitals Value Taken Time  BP 121/71 08/19/22 1406  Temp    Pulse 88 08/19/22 1409  Resp 10 08/19/22 1409  SpO2 99 % 08/19/22 1409  Vitals shown include unvalidated device data.  Last Pain:  Vitals:   08/19/22 1300  TempSrc:   PainSc: 0-No pain         Complications: No notable events documented.

## 2022-08-19 NOTE — Progress Notes (Signed)
  Progress Note   Patient: Caleb [name] GNF:[id number] DOB: [birth year] DOA: 08/18/2022     1 DOS: the patient was seen and examined on 08/19/2022 at 8:55 AM      Brief hospital course: Mr. Noble is a 72 y.o. M with DM, HTN, HLD, PVD (bilateral TCAR, bilateral popliteal artery stents, right fem-tib bypass, and hx amputation of L 4th and 5th toes as well as right partial calcaneus excision), sCHF EF 20%, AV stenosis s/p TAVR 10/2021 who was admitted twice for failure to heal his right partial calcaneus excision wound, and was planned for salvage BKA on 5/31 but presented one day early due to untreated pain.     Assessment and Plan: * Osteomyelitis of right lower extremity (HCC) - Consult Ortho, for amputation today - Stop Abx post op    AKI (acute kidney injury) (HCC) Cr 2.1 on admission, baseline is 1.5, trending down with IV fluids overnight - Continue IV fluids - Hold furosemide, Coreg -Avoid nephrotoxins, hypotension   Chronic systolic CHF (congestive heart failure) (HCC) Appears dehydrated - Hold furosemide, Coreg, hydralazine  Peripheral vascular disease (HCC) - Hold apixaban, defer to orthopedics when to resume postoperatively - Resume atorvastatin tonight, aspirin tomorrow  Coronary artery disease involving native coronary artery of native heart with angina pectoris (HCC) - Continue statin - Hold Imdur, BB for today given low BP  Anemia of chronic disease Hgb down to 7.9. - Transfusion per Ortho, delayed due to presence of Abs  S/P TAVR (transcatheter aortic valve replacement)    Stage IIIa chronic kidney disease Baseline 1.5-1.6  DM2 (diabetes mellitus, type 2) (HCC) On low dose 70/30 only - Resume SS corrections tonight, avoid lantus for now until we see sugar trend  Essential hypertension Pressure soft - Hold Coreg, furosemide, hydralazine, Imdur  Chronic hyponatremia Sodium stable relative to baseline - Hold furosemide for  today  Hyperlipidemia - Resume atorvastatin          Subjective: Patient has no complaints, he looks little tired but he says he feels fine, has no headache, chest pain, dyspnea, fatigue.  His only complaint is severe pain in the right flank.  No fever.  No nursing concerns.     Physical Exam: BP 107/75 (BP Location: Right Arm)   Pulse 92   Temp 98 F (36.7 C)   Resp 18   Ht 5\' 7"  (1.702 m)   Wt 80 kg   SpO2 94%   BMI 27.62 kg/m   Thin adult male, lying in bed, no acute distress Slightly tachycardic, regular, I did not appreciate murmurs, he has some mild nonpitting edema in the right leg, none on the left Attention normal, affect appropriate, judgment insight appear normal, oriented to person, place, time, generalized weakness, mild, but symmetric strength, speech fluent, face symmetric Respiratory normal, lungs clear without rales or wheezes Abdomen soft without tenderness palpation or guarding    Data Reviewed: CBC shows hemoglobin down to 7.9, normal white count Basic metabolic panel shows creatinine slightly improved from previous, other electrolytes appear normal    Family Communication: None present    Disposition: Status is: Inpatient The patient presented with gangrene which is failed outpatient management and now requires amputation  He will go to the OR today, disposition after working with physical therapy.        Author: Alberteen Sam, MD 08/19/2022 9:10 AM  For on call review [email or web address].

## 2022-08-19 NOTE — Assessment & Plan Note (Addendum)
Cr 2.1 on admission, baseline is 1.5, trending down with IV fluids overnight - Continue IV fluids - Hold furosemide, Coreg -Avoid nephrotoxins, hypotension

## 2022-08-19 NOTE — Op Note (Signed)
08/19/2022  2:07 PM  PATIENT:  Caleb [name]    PRE-OPERATIVE DIAGNOSIS:  Osteomyelitis Right Heel  POST-OPERATIVE DIAGNOSIS:  Same  PROCEDURE:  RIGHT BELOW KNEE AMPUTATION Application of Kerecis micro graft 38 cm  Application of Prevena customizable and Prevena arthroform wound VAC dressings Application of Vive Wear stump shrinker and the [employer] limb protector  SURGEON:  Nadara Mustard, MD  ANESTHESIA:   General  PREOPERATIVE INDICATIONS:  DADDY DISOTELL is a  72 y.o. male with a diagnosis of Osteomyelitis Right Heel who failed conservative measures and elected for surgical management.    The risks benefits and alternatives were discussed with the patient preoperatively including but not limited to the risks of infection, bleeding, nerve injury, cardiopulmonary complications, the need for revision surgery, among others, and the patient was willing to proceed.  OPERATIVE IMPLANTS:   Implant Name Type Inv. Item Serial No. Manufacturer Lot No. LRB No. Used Action  GRAFT SKIN WND MICRO 38 - [id number] Tissue GRAFT SKIN WND MICRO 38  [employer] [id number] Right 1 Explanted     OPERATIVE FINDINGS: Tissue margins were clear muscle had good color and contractility.  OPERATIVE PROCEDURE: Patient was brought to the operating room after undergoing a regional anesthetic.  After adequate levels anesthesia were obtained a thigh tourniquet was placed and the lower extremity was prepped using DuraPrep draped into a sterile field. The foot was draped out of the sterile field with impervious stockinette.  A timeout was called and the tourniquet inflated.  A transverse skin incision was made 12 cm distal to the tibial tubercle, the incision curved proximally, and a large posterior flap was created.  The tibia was transected just proximal to the skin incision and beveled anteriorly.  The fibula was transected just proximal to the tibial incision.  The sciatic nerve was pulled cut and allowed to  retract.  The vascular bundles were suture ligated with 2-0 silk.  The tourniquet was deflated and hemostasis obtained.       The Kerecis micro powder 38 cm was applied to the open wound that has a 200 cm surface area.    The deep and superficial fascial layers were closed using #1 Vicryl.  The skin was closed using staples.    The Prevena customizable dressing was applied this was overwrapped with the arthroform sponge.  Collier Flowers was used to secure the sponges and the circumferential compression was secured to the skin with Dermatac.  This was connected to the wound VAC pump and had a good suction fit this was covered with a stump shrinker and a limb protector.  Patient was taken to the PACU in stable condition.   DISCHARGE PLANNING:  Antibiotic duration: 24-hour antibiotics  Weightbearing: Nonweightbearing on the operative extremity  Pain medication: Opioid pathway  Dressing care/ Wound VAC: Continue wound VAC with the Prevena plus pump at discharge for 1 week  Ambulatory devices: Walker or kneeling scooter  Discharge to: Discharge planning based on recommendations per physical therapy  Follow-up: In the office 1 week after discharge.

## 2022-08-19 NOTE — Assessment & Plan Note (Signed)
Sodium stable relative to baseline - Hold furosemide for today

## 2022-08-19 NOTE — Assessment & Plan Note (Signed)
-  Continue Lipitor °

## 2022-08-19 NOTE — Assessment & Plan Note (Signed)
Baseline 1.5-1.6

## 2022-08-19 NOTE — Progress Notes (Signed)
Pt reports he consumed a chocolate ensure at 0430. Dr. Renold Don made aware, pt's surgery will need to be delayed. OR desk made aware.

## 2022-08-19 NOTE — Anesthesia Preprocedure Evaluation (Addendum)
Anesthesia Evaluation  Patient identified by MRN, date of birth, ID band Patient awake    Reviewed: Allergy & Precautions, NPO status , Patient's Chart, lab work & pertinent test results  History of Anesthesia Complications Negative for: history of anesthetic complications  Airway Mallampati: I  TM Distance: >3 FB Neck ROM: Full    Dental no notable dental hx. (+) Missing, Dental Advisory Given   Pulmonary neg pulmonary ROS   Pulmonary exam normal breath sounds clear to auscultation       Cardiovascular hypertension, Pt. on medications and Pt. on home beta blockers pulmonary hypertension(-) angina + Peripheral Vascular Disease and +CHF (Entresto)  Normal cardiovascular exam+ Valvular Problems/Murmurs (s/p TAVR)  Rhythm:Regular Rate:Normal  05/28/2022 ECHO: EF 25-30%, severely decreased LVF, mild LVH, Grade 2 DD, mild RVF, S/p TAVR, mild MR   Neuro/Psych   Anxiety        GI/Hepatic Neg liver ROS,neg GERD  ,,H/o pancreatitis   Endo/Other  diabetes, Insulin Dependent    Renal/GU Renal InsufficiencyRenal disease     Musculoskeletal negative musculoskeletal ROS (+)    Abdominal   Peds  Hematology  (+) Blood dyscrasia (Hb 11.6, plt 348K), anemia Stopped eliquis   Anesthesia Other Findings All: codeine, norco, percocet, tramadol  Reproductive/Obstetrics                             Anesthesia Physical Anesthesia Plan  ASA: 4  Anesthesia Plan: Regional   Post-op Pain Management: Tylenol PO (pre-op)* and Precedex   Induction:   PONV Risk Score and Plan: 2 and Propofol infusion, Ondansetron and TIVA  Airway Management Planned: Natural Airway and Nasal Cannula  Additional Equipment: None  Intra-op Plan:   Post-operative Plan:   Informed Consent:      Dental advisory given  Plan Discussed with: CRNA  Anesthesia Plan Comments: (PAT note written 07/28/2022 by Shonna Chock, PA-C. )         Anesthesia Quick Evaluation

## 2022-08-19 NOTE — Assessment & Plan Note (Signed)
On low dose 70/30 only, glucose good here - Continue SS corrections

## 2022-08-19 NOTE — Assessment & Plan Note (Signed)
Hgb down to 7.9. - Transfusion per Ortho, delayed due to presence of Abs

## 2022-08-19 NOTE — Anesthesia Procedure Notes (Signed)
Anesthesia Regional Block: Adductor canal block   Pre-Anesthetic Checklist: , timeout performed,  Correct Patient, Correct Site, Correct Laterality,  Correct Procedure, Correct Position, site marked,  Risks and benefits discussed,  Surgical consent,  Pre-op evaluation,  At surgeon's request and post-op pain management  Laterality: Lower and Right  Prep: chloraprep       Needles:  Injection technique: Single-shot  Needle Type: Stimiplex     Needle Length: 9cm  Needle Gauge: 21     Additional Needles:   Procedures:,,,, ultrasound used (permanent image in chart),,    Narrative:  Start time: 08/19/2022 12:40 PM End time: 08/19/2022 12:55 PM Injection made incrementally with aspirations every 5 mL.  Performed by: Personally  Anesthesiologist: Lewie Loron, MD  Additional Notes: BP cuff, EKG monitors applied. Sedation begun. Artery and nerve location verified with ultrasound. Anesthetic injected incrementally (5ml), slowly, and after negative aspirations under direct u/s guidance. Good fascial/perineural spread. Tolerated well.

## 2022-08-19 NOTE — Assessment & Plan Note (Signed)
Fluid status improving - Resume Coreg - Hold furosemide, hydralazine

## 2022-08-19 NOTE — Consult Note (Signed)
ORTHOPAEDIC CONSULTATION  REQUESTING PHYSICIAN: Howerter, Chaney Born, DO  Chief Complaint: Painful gangrene right foot  HPI: Caleb [name] is a 72 y.o. male who presents with gangrene right foot with osteomyelitis status post foot salvage intervention.  Patient is status post revascularization to the right lower extremity.  Despite revascularization patient did not have sufficient circulation to heal the gangrenous heel ulcer status post foot salvage intervention.  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 Excell Seltzer & Bartle   Severe aortic stenosis    Vitamin D deficiency 06/23/2015   Past Surgical History:  Procedure Laterality Date   ABDOMINAL AORTOGRAM W/LOWER EXTREMITY N/A 08/08/2019   Procedure: ABDOMINAL AORTOGRAM W/LOWER EXTREMITY;  Surgeon: Runell Gess, MD;  Location: [facility];  Service: Cardiovascular;  Laterality: N/A;   ABDOMINAL AORTOGRAM W/LOWER EXTREMITY N/A 11/18/2019   Procedure: ABDOMINAL AORTOGRAM W/LOWER EXTREMITY;  Surgeon: Runell Gess, MD;  Location: [facility];  Service: Cardiovascular;  Laterality: N/A;   ABDOMINAL AORTOGRAM W/LOWER EXTREMITY N/A 06/13/2022   Procedure: ABDOMINAL AORTOGRAM W/LOWER EXTREMITY;  Surgeon: Chuck Hint, MD;  Location: [facility] INVASIVE CV LAB;  Service: Cardiovascular;  Laterality: N/A;   APPLICATION OF  WOUND VAC  06/24/2022   Procedure: APPLICATION OF WOUND VAC;  Surgeon: Nadara Mustard, MD;  Location: [facility] OR;  Service: Orthopedics;;   CARDIAC CATHETERIZATION     COLONOSCOPY  12 years ago   in [place] clinic= normal exam per pt   ENDARTERECTOMY FEMORAL Right 06/17/2022   Procedure: SUPERFICIAL ENDARTERECTOMY OF COMMON FEMORAL ARTERY;  Surgeon: Victorino Sparrow, MD;  Location: [facility] OR;  Service: Vascular;  Laterality: Right;   FEMORAL-TIBIAL BYPASS GRAFT Right 06/17/2022   Procedure: RIGHT FEMORAL- ANTERIOR TIBIAL ARTERY BYPASS;  Surgeon: Victorino Sparrow, MD;  Location: [facility] OR;  Service: Vascular;  Laterality: Right;   I & D EXTREMITY Right 06/24/2022   Procedure: RIGHT PARTIAL CALCANEUS EXCISION;  Surgeon: Nadara Mustard, MD;  Location: [facility] OR;  Service: Orthopedics;  Laterality: Right;   I & D EXTREMITY Right 07/29/2022   Procedure: RIGHT PARTIAL CALCANEUS EXCISION;  Surgeon: Nadara Mustard, MD;  Location: [facility] OR;  Service: Orthopedics;  Laterality: Right;   INTRAOPERATIVE TRANSTHORACIC ECHOCARDIOGRAM N/A 01/11/2022   Procedure: INTRAOPERATIVE TRANSTHORACIC ECHOCARDIOGRAM;  Surgeon: Tonny Bollman, MD;  Location: [facility] INVASIVE CV LAB;  Service: Open Heart Surgery;  Laterality: N/A;   KNEE SURGERY     MULTIPLE EXTRACTIONS WITH ALVEOLOPLASTY N/A 12/09/2021   Procedure: MULTIPLE EXTRACTION;  Surgeon: Sharman Cheek, DMD;  Location: [facility] OR;  Service: Dentistry;  Laterality: N/A;   PERIPHERAL VASCULAR INTERVENTION Right 08/08/2019   Procedure: PERIPHERAL VASCULAR INTERVENTION;  Surgeon: Runell Gess, MD;  Location: [facility];  Service: Cardiovascular;  Laterality: Right;   PERIPHERAL VASCULAR INTERVENTION Left 11/18/2019   Procedure: PERIPHERAL VASCULAR INTERVENTION;  Surgeon: Runell Gess, MD;  Location: [facility] INVASIVE CV LAB;  Service: Cardiovascular;  Laterality: Left;  left popliteal artery   RIGHT/LEFT HEART CATH AND CORONARY ANGIOGRAPHY N/A 10/29/2021   Procedure: RIGHT/LEFT HEART CATH AND  CORONARY ANGIOGRAPHY;  Surgeon: Kathleene Hazel, MD;  Location: [facility];  Service: Cardiovascular;  Laterality: N/A;   TRANSCAROTID ARTERY REVASCULARIZATION  Left 12/16/2019   Procedure: TRANSCAROTID ARTERY REVASCULARIZATION;  Surgeon: Sherren Kerns, MD;  Location: [facility] OR;  Service: Vascular;  Laterality: Left;   TRANSCAROTID ARTERY REVASCULARIZATION  Right 02/03/2020   Procedure: RIGHT TRANSCAROTID ARTERY REVASCULARIZATION;  Surgeon: Sherren Kerns, MD;  Location: [facility] OR;  Service: Vascular;  Laterality: Right;   TRANSCATHETER AORTIC VALVE REPLACEMENT, TRANSFEMORAL N/A 01/11/2022   Procedure: Transcatheter Aortic Valve Replacement, Transfemoral;  Surgeon: Tonny Bollman, MD;  Location: [facility] INVASIVE CV LAB;  Service: Open Heart Surgery;  Laterality: N/A;   ULTRASOUND GUIDANCE FOR VASCULAR ACCESS Right 12/16/2019   Procedure: ULTRASOUND GUIDANCE FOR VASCULAR ACCESS;  Surgeon: Sherren Kerns, MD;  Location: [facility] OR;  Service: Vascular;  Laterality: Right;   ULTRASOUND GUIDANCE FOR VASCULAR ACCESS Right 02/03/2020   Procedure: ULTRASOUND GUIDANCE FOR VASCULAR ACCESS;  Surgeon: Sherren Kerns, MD;  Location: [facility] OR;  Service: Vascular;  Laterality: Right;   VASECTOMY     VEIN HARVEST Right 06/17/2022   Procedure: IPSILATERAL RIGHT GREATER SAPHENOUS VEIN HARVEST;  Surgeon: Victorino Sparrow, MD;  Location: [facility] OR;  Service: Vascular;  Laterality: Right;   Social History   Socioeconomic History   Marital status: Widowed    Spouse name: Not on file   Number of children: 3   Years of education: Not on file   Highest education level: High school graduate  Occupational History   Occupation: Caregiver    Comment: Special needs    Comment: retired  Tobacco Use   Smoking status: Never    Passive exposure: Never   Smokeless tobacco: Never  Vaping Use   Vaping Use: Never used  Substance and Sexual Activity   Alcohol use: Yes    Comment: occasional   Drug use: No    Sexual activity: Not Currently  Other Topics Concern   Not on file  Social History Narrative   Widower.  3 daughters and 3 grandchildren.     Social Determinants of Health   Financial Resource Strain: Low Risk  (10/29/2021)   Overall Financial Resource Strain (CARDIA)    Difficulty of Paying Living Expenses: Not hard at all  Food Insecurity: No Food Insecurity (08/19/2022)   Hunger Vital Sign    Worried About Running Out of Food in the Last Year: Never true    Ran Out of Food in the Last Year: Never true  Transportation Needs: No Transportation Needs (08/19/2022)   PRAPARE - Administrator, Civil Service (Medical): No    Lack of Transportation (Non-Medical): No  Physical Activity: Sufficiently Active (12/06/2020)   Exercise Vital Sign    Days of Exercise per Week: 4 days    Minutes of Exercise per Session: 60 min  Stress: No Stress Concern Present (12/06/2020)   [employer] of Occupational Health - Occupational Stress Questionnaire    Feeling of Stress : Not at all  Social Connections: Moderately Isolated (12/06/2020)   Social Connection and Isolation Panel [NHANES]    Frequency of Communication with Friends and Family: More than three times a week    Frequency of Social Gatherings with Friends and Family:  More than three times a week    Attends Religious Services: More than 4 times per year    Active Member of Clubs or Organizations: No    Attends Banker Meetings: Never    Marital Status: Widowed   Family History  Problem Relation Age of Onset   CAD Father    Hypertension Father    Alcohol abuse Father        Cause of death   Diabetes Mother    Colon polyps Mother    CAD Brother 29       CABG   Colon cancer Neg Hx    Esophageal cancer Neg Hx    Rectal cancer Neg Hx    Stomach cancer Neg Hx    - negative except otherwise stated in the family history section Allergies  Allergen Reactions   Codeine Nausea And Vomiting   Percocet  [Oxycodone-Acetaminophen] Nausea And Vomiting   Tramadol Hcl Nausea And Vomiting   Prior to Admission medications   Medication Sig Start Date End Date Taking? Authorizing Provider  acetaminophen (TYLENOL) 500 MG tablet Take 1 tablet (500 mg total) by mouth every 8 (eight) hours as needed for moderate pain. 08/15/22 08/15/23 Yes Leroy Sea, MD  amoxicillin-clavulanate (AUGMENTIN) 500-125 MG tablet Take 1 tablet by mouth 2 (two) times daily. 08/15/22  Yes Leroy Sea, MD  apixaban (ELIQUIS) 5 MG TABS tablet Take 1 tablet (5 mg total) by mouth 2 (two) times daily. 08/04/22  Yes Danford, Earl Lites, MD  aspirin EC 81 MG tablet Take 81 mg by mouth in the morning.   Yes [provider]  atorvastatin (LIPITOR) 40 MG tablet Take 1 tablet (40 mg total) by mouth in the morning. 08/15/22  Yes Leroy Sea, MD  carvedilol (COREG) 3.125 MG tablet Take 1 tablet (3.125 mg total) by mouth 2 (two) times daily with a meal. 08/16/22  Yes Leroy Sea, MD  Cholecalciferol (VITAMIN D-3) 25 MCG (1000 UT) CAPS Take 1,000 Units by mouth daily.   Yes [provider]  ferrous sulfate 325 (65 FE) MG tablet Take 325 mg by mouth daily with breakfast.   Yes [provider]  furosemide (LASIX) 80 MG tablet Take 1 tablet (80 mg total) by mouth daily. And 1 tablet (40 mg) in the evening 08/15/22  Yes Leroy Sea, MD  gabapentin (NEURONTIN) 300 MG capsule Take 1 capsule (300 mg total) by mouth 3 (three) times daily. 08/15/22  Yes Leroy Sea, MD  hydrALAZINE (APRESOLINE) 10 MG tablet Take 1 tablet (10 mg total) by mouth 3 (three) times daily. 07/25/22 08/24/22 Yes Hoy Register, MD  insulin NPH-regular Human (70-30) 100 UNIT/ML injection Inject 7-8 Units into the skin See admin instructions. Inject 8 units into the skin before breakfast and 7 units before the evening meal   Yes [provider]  isosorbide mononitrate (IMDUR) 30 MG 24 hr tablet Take 0.5 tablets (15 mg  total) by mouth daily. 08/15/22  Yes Leroy Sea, MD  ondansetron (ZOFRAN) 4 MG tablet Take 1 tablet (4 mg total) by mouth every 8 (eight) hours as needed for nausea or vomiting. 07/25/22  Yes Newlin, Odette Horns, MD  polyethylene glycol (MIRALAX / GLYCOLAX) 17 g packet Take 17 g by mouth daily. Patient taking differently: Take 17 g by mouth daily as needed for mild constipation. 06/02/22  Yes Osvaldo Shipper, MD  vitamin B-12 (CYANOCOBALAMIN) 500 MCG tablet Take 500 mcg by mouth in the morning. 08/24/21  Yes [provider]  Continuous Blood Gluc Sensor (FREESTYLE LIBRE 14 DAY SENSOR) MISC Inject 1 Device into the skin every 14 (fourteen) days.    [provider]  PRESCRIPTION MEDICATION Inject 0.1-1 mLs as directed See admin instructions. Tri-Mix Standard Strength 5 ml. Formula: Prostaglandin 10 mcg/ml, Papaverine 30 mg/ml, Phentolamine 1 mg/ml. Inject 0.1 ml into side of penis as directed as needed (to be injected immediately before sexual intercourse) may increase the dose by 0.1 ml every 48 hours to achieve an erection. Max dose 1 ml. Patient not taking: Reported on 08/18/2022    [provider]   DG Foot Complete Right  Result Date: 08/18/2022 CLINICAL DATA:  Right foot pain. EXAM: RIGHT FOOT COMPLETE - 3+ VIEW COMPARISON:  June 07, 2022. FINDINGS: Status post amputation of posterior calcaneus. There is some irregularity involving the residual portion of the anterior calcaneus which may be postsurgical in etiology, but osteomyelitis cannot be excluded. Vascular calcifications are noted. No other fracture or dislocation is noted. IMPRESSION: Status post surgical amputation of posterior calcaneus. Irregularity is seen involving residual portion of anterior calcaneus which may be postsurgical in etiology, but osteomyelitis cannot be excluded. Electronically Signed   By: Lupita Raider M.D.   On: 08/18/2022 16:05   - pertinent xrays, CT, MRI studies were reviewed and  independently interpreted  Positive ROS: All other systems have been reviewed and were otherwise negative with the exception of those mentioned in the HPI and as above.  Physical Exam: General: Alert, no acute distress Psychiatric: Patient is competent for consent with normal mood and affect Lymphatic: No axillary or cervical lymphadenopathy Cardiovascular: No pedal edema Respiratory: No cyanosis, no use of accessory musculature GI: No organomegaly, abdomen is soft and non-tender    Images:  @ENCIMAGES @  Labs:  Lab Results  Component Value Date   HGBA1C 7.9 (H) 08/11/2022   HGBA1C 7.8 (H) 07/29/2022   HGBA1C 7.5 (A) 07/25/2022   ESRSEDRATE 140 (H) 08/11/2022   ESRSEDRATE 92 (H) 06/09/2022   CRP 26.5 (H) 08/15/2022   CRP 23.6 (H) 08/14/2022   CRP 26.2 (H) 08/13/2022   LABURIC 8.9 (H) 08/12/2022   REPTSTATUS 06/29/2022 FINAL 06/24/2022   GRAMSTAIN NO WBC SEEN RARE GRAM POSITIVE COCCI  06/24/2022   CULT  06/24/2022    ABUNDANT GROUP B STREP(S.AGALACTIAE)ISOLATED TESTING AGAINST S. AGALACTIAE NOT ROUTINELY PERFORMED DUE TO PREDICTABILITY OF AMP/PEN/VAN SUSCEPTIBILITY. AMONG MIXED ORGANISMS NO ANAEROBES ISOLATED Performed at [facility], [address], [place], [place] [address]    [facility] SERRATIA MARCESCENS 08/12/2012    Lab Results  Component Value Date   ALBUMIN 2.1 (L) 08/18/2022   ALBUMIN 2.3 (L) 08/14/2022   ALBUMIN 2.5 (L) 08/12/2022   PREALBUMIN 6 (L) 08/12/2022   LABURIC 8.9 (H) 08/12/2022        Latest Ref Rng & Units 08/18/2022    5:51 PM 08/15/2022    3:36 AM 08/14/2022    4:22 AM  CBC EXTENDED  WBC 4.0 - 10.5 K/uL 12.1  11.6  11.2   RBC 4.22 - 5.81 MIL/uL 2.93  2.99  3.05   Hemoglobin 13.0 - 17.0 g/dL 7.9  7.7  8.0   HCT 60.4 - 52.0 % 24.9  24.6  25.0   Platelets 150 - 400 K/uL 516  458  432   NEUT# 1.7 - 7.7 K/uL 9.0  8.1  8.5   Lymph# 0.7 - 4.0 K/uL 1.7  1.8  1.4     Neurologic: Patient  does not have protective sensation bilateral  lower extremities.   MUSCULOSKELETAL:   Skin: Examination patient has necrotic skin and necrotic bone on the right heel.  The skin is thin and atrophic with green discoloration around the edges.  Patient's hemoglobin is 7.9 with a white cell count of 12.1.  Assessment: Gangrene right heel status post revascularization.  With hemoglobin 7.9.  Plan: Patient is planned for a right below-knee amputation today.  I will place orders for transfusion preoperatively.  Thank you for the consult and the opportunity to see Mr. Kamarien Langridge, MD [facility] [phone] 6:08 AM

## 2022-08-19 NOTE — Hospital Course (Signed)
Caleb [name] is a 72 y.o. M with DM, HTN, HLD, PVD (bilateral TCAR, bilateral popliteal artery stents, right fem-tib bypass, and hx amputation of L 4th and 5th toes as well as right partial calcaneus excision), sCHF EF 20%, AV stenosis s/p TAVR 10/2021 who was admitted twice for failure to heal his right partial calcaneus excision wound, and was planned for salvage BKA on 5/31 but presented one day early due to untreated pain.    5/30: Admitted 5/31: Taken to OR for BKA

## 2022-08-20 ENCOUNTER — Encounter (HOSPITAL_COMMUNITY): Payer: Self-pay | Admitting: Orthopedic Surgery

## 2022-08-20 DIAGNOSIS — I739 Peripheral vascular disease, unspecified: Secondary | ICD-10-CM | POA: Diagnosis not present

## 2022-08-20 DIAGNOSIS — I5022 Chronic systolic (congestive) heart failure: Secondary | ICD-10-CM | POA: Diagnosis not present

## 2022-08-20 DIAGNOSIS — N179 Acute kidney failure, unspecified: Secondary | ICD-10-CM | POA: Diagnosis not present

## 2022-08-20 DIAGNOSIS — M869 Osteomyelitis, unspecified: Secondary | ICD-10-CM | POA: Diagnosis not present

## 2022-08-20 DIAGNOSIS — R7401 Elevation of levels of liver transaminase levels: Secondary | ICD-10-CM | POA: Insufficient documentation

## 2022-08-20 LAB — CBC
HCT: 26.4 % — ABNORMAL LOW (ref 39.0–52.0)
Hemoglobin: 8.1 g/dL — ABNORMAL LOW (ref 13.0–17.0)
MCH: 25.6 pg — ABNORMAL LOW (ref 26.0–34.0)
MCHC: 30.7 g/dL (ref 30.0–36.0)
MCV: 83.5 fL (ref 80.0–100.0)
Platelets: 487 10*3/uL — ABNORMAL HIGH (ref 150–400)
RBC: 3.16 MIL/uL — ABNORMAL LOW (ref 4.22–5.81)
RDW: 16.1 % — ABNORMAL HIGH (ref 11.5–15.5)
WBC: 7.9 10*3/uL (ref 4.0–10.5)
nRBC: 0 % (ref 0.0–0.2)

## 2022-08-20 LAB — COMPREHENSIVE METABOLIC PANEL
ALT: 48 U/L — ABNORMAL HIGH (ref 0–44)
AST: 80 U/L — ABNORMAL HIGH (ref 15–41)
Albumin: 1.9 g/dL — ABNORMAL LOW (ref 3.5–5.0)
Alkaline Phosphatase: 110 U/L (ref 38–126)
Anion gap: 10 (ref 5–15)
BUN: 28 mg/dL — ABNORMAL HIGH (ref 8–23)
CO2: 23 mmol/L (ref 22–32)
Calcium: 8.2 mg/dL — ABNORMAL LOW (ref 8.9–10.3)
Chloride: 94 mmol/L — ABNORMAL LOW (ref 98–111)
Creatinine, Ser: 1.54 mg/dL — ABNORMAL HIGH (ref 0.61–1.24)
GFR, Estimated: 48 mL/min — ABNORMAL LOW (ref >60)
Glucose, Bld: 151 mg/dL — ABNORMAL HIGH (ref 70–99)
Potassium: 4.4 mmol/L (ref 3.5–5.1)
Sodium: 127 mmol/L — ABNORMAL LOW (ref 135–145)
Total Bilirubin: 0.4 mg/dL (ref 0.3–1.2)
Total Protein: 7.1 g/dL (ref 6.5–8.1)

## 2022-08-20 LAB — GLUCOSE, CAPILLARY
Glucose-Capillary: 174 mg/dL — ABNORMAL HIGH (ref 70–99)
Glucose-Capillary: 180 mg/dL — ABNORMAL HIGH (ref 70–99)
Glucose-Capillary: 135 mg/dL — ABNORMAL HIGH (ref 70–99)
Glucose-Capillary: 179 mg/dL — ABNORMAL HIGH (ref 70–99)

## 2022-08-20 MED ORDER — HYDROMORPHONE HCL 1 MG/ML IJ SOLN
1.0000 mg | INTRAMUSCULAR | Status: DC | PRN
  Administered 2022-08-20 – 2022-08-22 (×11): 1 mg via INTRAVENOUS
  Filled 2022-08-20 (×11): qty 1

## 2022-08-20 MED ORDER — OXYCODONE HCL 5 MG PO TABS
5.0000 mg | ORAL_TABLET | ORAL | Status: DC | PRN
  Filled 2022-08-20: qty 1

## 2022-08-20 MED ORDER — HYDROCODONE-ACETAMINOPHEN 5-325 MG PO TABS
1.0000 | ORAL_TABLET | Freq: Four times a day (QID) | ORAL | Status: DC | PRN
  Administered 2022-08-20: 1 via ORAL
  Filled 2022-08-20: qty 1

## 2022-08-20 MED ORDER — APIXABAN 5 MG PO TABS
5.0000 mg | ORAL_TABLET | Freq: Two times a day (BID) | ORAL | Status: DC
  Administered 2022-08-21 – 2022-08-22 (×3): 5 mg via ORAL
  Filled 2022-08-20 (×3): qty 1

## 2022-08-20 MED ORDER — SODIUM CHLORIDE 0.9 % IV SOLN
125.0000 mg | Freq: Every day | INTRAVENOUS | Status: DC
  Administered 2022-08-20 – 2022-08-22 (×3): 125 mg via INTRAVENOUS
  Filled 2022-08-20 (×3): qty 10

## 2022-08-20 MED ORDER — HYDROMORPHONE HCL 1 MG/ML IJ SOLN: 0.5000 mg | INTRAMUSCULAR | Status: DC | PRN

## 2022-08-20 NOTE — Evaluation (Signed)
Occupational Therapy Evaluation Patient Details Name: Caleb [name] MRN: [id number] DOB: [birth year] Today's Date: 08/20/2022   History of Present Illness Pt is 72 yo male who presents with gangrene right foot with osteomyelitis s/p foot salvage intervention. Right below knee amputation performed 5/31. PMHx: osteomyelitis of his right foot status post resection, hx amputation of L 4th and 5th toes as well as right partial calcaneus excision, PVD status post stents with chronic wound, DM2, HTN, HLD, systolic CHF EF 25-30, aortic stenosis status post TAVR 2023.   Clinical Impression   Pt evaluated s/p above admission list. Pt reports mod I for ADLs at w/c level, RW use for stand pivot transfers and a w/c for mobility at baseline. Pt receives assistance from [facility] nurse for medication management and IADL assistance from cousin at baseline. During session, pt limited secondary to R residual limb pain, generalized weakness, decreased balance, and decreased BUE dexterity secondary to diabetic neuropathy. Pt educated on RLE precautions and how to don/doff R limb protector with pt demonstrating/verbalizing understanding. Pt currently requires setup A for seated UB ADLs and min guard A for LB ADLs in sitting. During session, pt required mod A +2 for STS transfer from EOB and stand pivot to chair with use of RW. Pt would continue to benefit from acute OT services to maximize functional independence and facilitate transfer to intensive inpatient follow up therapy, >3 hours/day after discharge.       Recommendations for follow up therapy are one component of a multi-disciplinary discharge planning process, led by the attending physician.  Recommendations may be updated based on patient status, additional functional criteria and insurance authorization.   Assistance Recommended at Discharge Frequent or constant Supervision/Assistance  Patient can return home with the following Two people to help with walking and/or  transfers;A little help with bathing/dressing/bathroom;Assistance with cooking/housework;Direct supervision/assist for medications management;Assist for transportation;Help with stairs or ramp for entrance    Functional Status Assessment  Patient has had a recent decline in their functional status and demonstrates the ability to make significant improvements in function in a reasonable and predictable amount of time.  Equipment Recommendations  Other (comment) (defer)    Recommendations for Other Services Rehab consult     Precautions / Restrictions Precautions Precautions: Fall Precaution Comments: fall, R BKA with wound vac; hx of LLE 4th and 5th digit amputation Required Braces or Orthoses: Other Brace Other Brace: limb protector Restrictions Weight Bearing Restrictions: Yes RLE Weight Bearing: Non weight bearing      Mobility Bed Mobility Overal bed mobility: Needs Assistance Bed Mobility: Supine to Sit     Supine to sit: Supervision, HOB elevated     General bed mobility comments: supervision A for safety, HOB elevated    Transfers Overall transfer level: Needs assistance Equipment used: Rolling walker (2 wheels) Transfers: Sit to/from Stand, Bed to chair/wheelchair/BSC Sit to Stand: Mod assist, +2 physical assistance, +2 safety/equipment Stand pivot transfers: Mod assist, +2 physical assistance, +2 safety/equipment         General transfer comment: STS transfer from EOB with mod A +2 and use of RW, stand pivot transfer from EOB>chair with RW and mod A +2      Balance Overall balance assessment: Needs assistance Sitting-balance support: No upper extremity supported, Feet supported Sitting balance-Leahy Scale: Good Sitting balance - Comments: sitting EOB   Standing balance support: Bilateral upper extremity supported, Reliant on assistive device for balance Standing balance-Leahy Scale: Poor Standing balance comment: reliant on BUE support on RW  for  balance                           ADL either performed or assessed with clinical judgement   ADL Overall ADL's : Needs assistance/impaired Eating/Feeding: Set up;Sitting   Grooming: Set up;Sitting   Upper Body Bathing: Sitting;Set up   Lower Body Bathing: Min guard;Sitting/lateral leans   Upper Body Dressing : Set up;Sitting   Lower Body Dressing: Min guard;Sitting/lateral leans Lower Body Dressing Details (indicate cue type and reason): Pt doffed/donned RLE limb protector while long sitting in bed with min guard A and mod verbal cues for technique. Pt donned L sock while long sitting in bed with setup A. Toilet Transfer: Moderate assistance;+2 for physical assistance;+2 for safety/equipment;Stand-pivot;BSC/3in1 Toilet Transfer Details (indicate cue type and reason): simulated Toileting- Clothing Manipulation and Hygiene: Min guard;Sitting/lateral lean       Functional mobility during ADLs: Moderate assistance;+2 for physical assistance;+2 for safety/equipment;Rolling walker (2 wheels) General ADL Comments: Pt utilizes manual w/c for mobility and RW for functional transfers at baseline.     Vision Baseline Vision/History: 1 Wears glasses Ability to See in Adequate Light: 0 Adequate Vision Assessment?: No apparent visual deficits     Perception Perception Perception Tested?: No   Praxis Praxis Praxis tested?: Not tested    Pertinent Vitals/Pain Pain Assessment Pain Assessment: Faces Faces Pain Scale: Hurts little more Pain Location: phantom pain R foot Pain Descriptors / Indicators: Discomfort, Grimacing Pain Intervention(s): Limited activity within patient's tolerance, Monitored during session     Hand Dominance Left   Extremity/Trunk Assessment Upper Extremity Assessment Upper Extremity Assessment: Generalized weakness RUE Deficits / Details: Decreased dexterity noted secondary to diabetic neuropathy. To be further assessed. LUE Deficits / Details:  Decreased dexterity noted secondary to diabetic neuropathy. To be further assessed.   Lower Extremity Assessment Lower Extremity Assessment: Defer to PT evaluation   Cervical / Trunk Assessment Cervical / Trunk Assessment: Normal   Communication Communication Communication: No difficulties   Cognition Arousal/Alertness: Awake/alert Behavior During Therapy: WFL for tasks assessed/performed Overall Cognitive Status: Within Functional Limits for tasks assessed                                 General Comments: Pt A+O x4, pleasant and agreeable throughout session     General Comments  VSS on RA, family present at end of session    Exercises     Shoulder Instructions      Home Living Family/patient expects to be discharged to:: Private residence Living Arrangements: Alone Available Help at Discharge: Family;Personal care attendant Type of Home: Apartment Home Access: Level entry;Other (comment) (small threshold)     Home Layout: One level     Bathroom Shower/Tub: Tub/shower unit;Sponge bathes at baseline   Bathroom Toilet: Standard Bathroom Accessibility: Yes How Accessible: Accessible via walker Home Equipment: Crutches;Cane - single point;Rolling Walker (2 wheels);Wheelchair - manual   Additional Comments: HH nurse assists with medication management, cousin comes 1x week to bring meals and complete housekeeping. Pt plans on moving in with family in [place] within 5-8 months.      Prior Functioning/Environment Prior Level of Function : History of Falls (last six months);Independent/Modified Independent             Mobility Comments: Pt uses w/c for mobility, RW for stand pivot transfers. Pt reports one fall 2 weeks ago when transferring to w/c.  ADLs Comments: Pt reports mod I with ADLs at w/c level. Recieves assistance for IADLs.        OT Problem List: Decreased strength;Decreased range of motion;Decreased activity tolerance;Impaired balance (sitting  and/or standing);Decreased knowledge of precautions;Pain;Impaired UE functional use      OT Treatment/Interventions: Self-care/ADL training;Energy conservation;DME and/or AE instruction;Therapeutic activities;Patient/family education;Balance training    OT Goals(Current goals can be found in the care plan section) Acute Rehab OT Goals Patient Stated Goal: to get stronger OT Goal Formulation: With patient Time For Goal Achievement: 09/03/22 Potential to Achieve Goals: Good ADL Goals Pt Will Perform Lower Body Dressing: with modified independence;sitting/lateral leans Pt Will Transfer to Toilet: with min guard assist;bedside commode;stand pivot transfer Pt Will Perform Toileting - Clothing Manipulation and hygiene: with modified independence;sitting/lateral leans  OT Frequency: Min 2X/week    Co-evaluation              AM-PAC OT "6 Clicks" Daily Activity     Outcome Measure Help from another person eating meals?: A Little Help from another person taking care of personal grooming?: A Little Help from another person toileting, which includes using toliet, bedpan, or urinal?: A Lot Help from another person bathing (including washing, rinsing, drying)?: A Little Help from another person to put on and taking off regular upper body clothing?: A Little Help from another person to put on and taking off regular lower body clothing?: A Little 6 Click Score: 17   End of Session Equipment Utilized During Treatment: Gait belt;Rolling walker (2 wheels) Nurse Communication: Mobility status  Activity Tolerance: Patient tolerated treatment well Patient left: in chair;with call bell/phone within reach;with chair alarm set;with family/visitor present  OT Visit Diagnosis: Unsteadiness on feet (R26.81);Other abnormalities of gait and mobility (R26.89);History of falling (Z91.81);Pain Pain - Right/Left: Right Pain - part of body: Leg                Time: 2956-2130 OT Time Calculation (min): 34  min Charges:  OT General Charges $OT Visit: 1 Visit OT Evaluation $OT Eval Moderate Complexity: 1 Mod  Sherley Bounds, OTS Acute Rehabilitation Services Office [phone] Secure Chat Communication Preferred   Sherley Bounds 08/20/2022, 1:34 PM

## 2022-08-20 NOTE — Evaluation (Signed)
Physical Therapy Evaluation Patient Details Name: Caleb [name] MRN: [id number] DOB: 1950-11-22 Today's Date: 08/20/2022  History of Present Illness  Pt is 72 yo male who presents with gangrene right foot with osteomyelitis s/p foot salvage intervention. Right below knee amputation performed 5/31. PMHx: osteomyelitis of his right foot status post resection, hx amputation of L 4th and 5th toes as well as right partial calcaneus excision, PVD status post stents with chronic wound, DM2, HTN, HLD, systolic CHF EF 25-30, aortic stenosis status post TAVR 2023.  Clinical Impression  Pt admitted with above. Pt presents with R residual limb pain, weakness, and decreased standing balance. Pt received up in the chair and eager to participate. Able to perform seated and standing exercises for residual limb strengthening and transfer training. Pt requiring two person minimal assist for sit to stand transfers and min guard assist for lateral scoot transfer back to the bed. Demonstrates good activity tolerance throughout. Will benefit from further training and pre gait training, wheelchair mobility and amputee education. Recommend intensive post acute rehab to address deficits and maximize functional mobility. Suspect good progress given high level of motivation and PLOF.      Recommendations for follow up therapy are one component of a multi-disciplinary discharge planning process, led by the attending physician.  Recommendations may be updated based on patient status, additional functional criteria and insurance authorization.  Follow Up Recommendations Can patient physically be transported by private vehicle: Yes     Assistance Recommended at Discharge Frequent or constant Supervision/Assistance  Patient can return home with the following  A little help with walking and/or transfers;A little help with bathing/dressing/bathroom;Assistance with cooking/housework;Assist for transportation;Help with stairs or  ramp for entrance    Equipment Recommendations None recommended by PT  Recommendations for Other Services  Rehab consult    Functional Status Assessment Patient has had a recent decline in their functional status and demonstrates the ability to make significant improvements in function in a reasonable and predictable amount of time.     Precautions / Restrictions Precautions Precautions: Fall Precaution Comments: fall, R BKA with wound vac; hx of LLE 4th and 5th digit amputation Required Braces or Orthoses: Other Brace Other Brace: limb protector Restrictions Weight Bearing Restrictions: Yes RLE Weight Bearing: Non weight bearing      Mobility  Bed Mobility Overal bed mobility: Needs Assistance Bed Mobility: Sit to Supine       Sit to supine: Supervision   General bed mobility comments: No physical assist required    Transfers Overall transfer level: Needs assistance Equipment used: Rolling walker (2 wheels), None Transfers: Sit to/from Stand, Bed to chair/wheelchair/BSC Sit to Stand: +2 physical assistance, +2 safety/equipment, Min assist          Lateral/Scoot Transfers: Min guard, +2 safety/equipment General transfer comment: STS transfer from recliner with minA +2, cues for hand placement. Able to take a small hop forwards/backwards with decreased foot clearance. Min guard for lateral scoot from bed to chair; requiring multiple scoots to progress over.    Ambulation/Gait               General Gait Details: unable  Stairs            Wheelchair Mobility    Modified Rankin (Stroke Patients Only)       Balance Overall balance assessment: Needs assistance Sitting-balance support: No upper extremity supported, Feet supported Sitting balance-Leahy Scale: Good Sitting balance - Comments: sitting EOB   Standing balance support: Bilateral upper extremity  supported, Reliant on assistive device for balance Standing balance-Leahy Scale:  Poor Standing balance comment: reliant on BUE support on RW for balance                             Pertinent Vitals/Pain Pain Assessment Pain Assessment: Faces Faces Pain Scale: Hurts little more Pain Location: residual ilmb Pain Descriptors / Indicators: Discomfort, Grimacing Pain Intervention(s): Limited activity within patient's tolerance, Monitored during session, Premedicated before session    Home Living Family/patient expects to be discharged to:: Private residence Living Arrangements: Alone Available Help at Discharge: Family;Personal care attendant Type of Home: Apartment Home Access: Level entry;Other (comment) (small threshold)       Home Layout: One level Home Equipment: Crutches;Cane - single point;Rolling Walker (2 wheels);Wheelchair - manual Additional Comments: HH nurse assists with medication management, cousin comes 1x week to bring meals and complete housekeeping. Pt plans on moving in with family in [place] within 5-8 months.    Prior Function Prior Level of Function : History of Falls (last six months);Independent/Modified Independent             Mobility Comments: Pt uses w/c for mobility, RW for stand pivot transfers. Pt reports one fall 2 weeks ago when transferring to w/c. ADLs Comments: Pt reports mod I with ADLs at w/c level. Recieves assistance for IADLs.     Hand Dominance   Dominant Hand: Left    Extremity/Trunk Assessment   Upper Extremity Assessment Upper Extremity Assessment: Defer to OT evaluation RUE Deficits / Details: Decreased dexterity noted secondary to diabetic neuropathy. To be further assessed. LUE Deficits / Details: Decreased dexterity noted secondary to diabetic neuropathy. To be further assessed.    Lower Extremity Assessment Lower Extremity Assessment: RLE deficits/detail;LLE deficits/detail RLE Deficits / Details: S/p BKA. At least anti gravity strength LLE Deficits / Details: Grossly 5/5 strength, hx of 4th  and 5th toe amputations    Cervical / Trunk Assessment Cervical / Trunk Assessment: Normal  Communication   Communication: No difficulties  Cognition Arousal/Alertness: Awake/alert Behavior During Therapy: WFL for tasks assessed/performed Overall Cognitive Status: Within Functional Limits for tasks assessed                                 General Comments: Pt A+O x4, pleasant and agreeable throughout session        General Comments General comments (skin integrity, edema, etc.): VSS on RA, family present at end of session    Exercises Amputee Exercises Quad Sets: Right, 10 reps, Seated Hip Flexion/Marching: Right, Standing, Other (comment) (3 reps)   Assessment/Plan    PT Assessment Patient needs continued PT services  PT Problem List Decreased mobility;Decreased knowledge of precautions;Decreased knowledge of use of DME;Pain;Impaired sensation;Decreased skin integrity;Decreased strength;Decreased activity tolerance;Decreased balance       PT Treatment Interventions DME instruction;Gait training;Functional mobility training;Therapeutic activities;Therapeutic exercise;Balance training;Patient/family education;Neuromuscular re-education    PT Goals (Current goals can be found in the Care Plan section)  Acute Rehab PT Goals Patient Stated Goal: agreeable to rehab PT Goal Formulation: With patient Time For Goal Achievement: 09/03/22 Potential to Achieve Goals: Good    Frequency Min 3X/week     Co-evaluation               AM-PAC PT "6 Clicks" Mobility  Outcome Measure Help needed turning from your back to your side while in a flat  bed without using bedrails?: A Little Help needed moving from lying on your back to sitting on the side of a flat bed without using bedrails?: A Little Help needed moving to and from a bed to a chair (including a wheelchair)?: A Little Help needed standing up from a chair using your arms (e.g., wheelchair or bedside chair)?:  A Lot Help needed to walk in hospital room?: Total Help needed climbing 3-5 steps with a railing? : Total 6 Click Score: 13    End of Session Equipment Utilized During Treatment: Gait belt Activity Tolerance: Patient tolerated treatment well Patient left: in chair;with call bell/phone within reach;with chair alarm set Nurse Communication: Mobility status PT Visit Diagnosis: Difficulty in walking, not elsewhere classified (R26.2);Other abnormalities of gait and mobility (R26.89);Pain;Unsteadiness on feet (R26.81) Pain - Right/Left: Right Pain - part of body:  (residual limb)    Time: 1610-9604 PT Time Calculation (min) (ACUTE ONLY): 27 min   Charges:   PT Evaluation $PT Eval Moderate Complexity: 1 Mod PT Treatments $Therapeutic Activity: 8-22 mins        Lillia Pauls, PT, DPT Acute Rehabilitation Services Office [phone]   Norval Morton 08/20/2022, 3:35 PM

## 2022-08-20 NOTE — Plan of Care (Signed)
Patient is resting in bed with normal vitals and respirations. Patient is sitting in chair with family at bedside. Pain is currently being managed. No output in wound vac. Plan of care is ongoing.  Problem: Education: Goal: Knowledge of the prescribed therapeutic regimen will improve Outcome: Progressing Goal: Ability to verbalize activity precautions or restrictions will improve Outcome: Progressing Goal: Understanding of discharge needs will improve Outcome: Progressing   Problem: Activity: Goal: Ability to perform//tolerate increased activity and mobilize with assistive devices will improve Outcome: Progressing   Problem: Clinical Measurements: Goal: Postoperative complications will be avoided or minimized Outcome: Progressing   Problem: Self-Care: Goal: Ability to meet self-care needs will improve Outcome: Progressing   Problem: Self-Concept: Goal: Ability to maintain and perform role responsibilities to the fullest extent possible will improve Outcome: Progressing   Problem: Pain Management: Goal: Pain level will decrease with appropriate interventions Outcome: Progressing   Problem: Skin Integrity: Goal: Demonstration of wound healing without infection will improve Outcome: Progressing   Problem: Education: Goal: Knowledge of General Education information will improve Description: Including pain rating scale, medication(s)/side effects and non-pharmacologic comfort measures Outcome: Progressing   Problem: Health Behavior/Discharge Planning: Goal: Ability to manage health-related needs will improve Outcome: Progressing   Problem: Clinical Measurements: Goal: Ability to maintain clinical measurements within normal limits will improve Outcome: Progressing Goal: Will remain free from infection Outcome: Progressing Goal: Diagnostic test results will improve Outcome: Progressing Goal: Respiratory complications will improve Outcome: Progressing Goal: Cardiovascular  complication will be avoided Outcome: Progressing   Problem: Activity: Goal: Risk for activity intolerance will decrease Outcome: Progressing   Problem: Nutrition: Goal: Adequate nutrition will be maintained Outcome: Progressing   Problem: Coping: Goal: Level of anxiety will decrease Outcome: Progressing   Problem: Elimination: Goal: Will not experience complications related to bowel motility Outcome: Progressing Goal: Will not experience complications related to urinary retention Outcome: Progressing   Problem: Pain Managment: Goal: General experience of comfort will improve Outcome: Progressing   Problem: Safety: Goal: Ability to remain free from injury will improve Outcome: Progressing   Problem: Skin Integrity: Goal: Risk for impaired skin integrity will decrease Outcome: Progressing   Problem: Education: Goal: Ability to describe self-care measures that may prevent or decrease complications (Diabetes Survival Skills Education) will improve Outcome: Progressing Goal: Individualized Educational Video(s) Outcome: Progressing   Problem: Coping: Goal: Ability to adjust to condition or change in health will improve Outcome: Progressing   Problem: Fluid Volume: Goal: Ability to maintain a balanced intake and output will improve Outcome: Progressing   Problem: Health Behavior/Discharge Planning: Goal: Ability to identify and utilize available resources and services will improve Outcome: Progressing Goal: Ability to manage health-related needs will improve Outcome: Progressing   Problem: Metabolic: Goal: Ability to maintain appropriate glucose levels will improve Outcome: Progressing   Problem: Nutritional: Goal: Maintenance of adequate nutrition will improve Outcome: Progressing Goal: Progress toward achieving an optimal weight will improve Outcome: Progressing   Problem: Skin Integrity: Goal: Risk for impaired skin integrity will decrease Outcome:  Progressing   Problem: Tissue Perfusion: Goal: Adequacy of tissue perfusion will improve Outcome: Progressing

## 2022-08-20 NOTE — Progress Notes (Signed)
  Progress Note   Patient: Caleb [name] ZOX:[id number] DOB: 1950-08-24 DOA: 08/18/2022     2 DOS: the patient was seen and examined on 08/20/2022 at 9:50 AM      Brief hospital course: [name] is a 72 y.o. M with DM, HTN, HLD, PVD (bilateral TCAR, bilateral popliteal artery stents, right fem-tib bypass, and hx amputation of L 4th and 5th toes as well as right partial calcaneus excision), sCHF EF 20%, AV stenosis s/p TAVR 10/2021 who was admitted twice for failure to heal his right partial calcaneus excision wound, and was planned for salvage BKA on 5/31 but presented one day early due to untreated pain.     Assessment and Plan: * Osteomyelitis of right lower extremity (HCC) Status post right below-knee amputation and application of Kerecis micrograft and Prevena and stump shrinker on 5/31 by Dr. Lajoyce Corners     AKI (acute kidney injury) (HCC) Cr 2.1 on admission, baseline is 1.5 Cr resolved to baseline post-op and post-transfusion. - Hold furosemide, Coreg for now - Avoid nephrotoxins, hypotension   Chronic hyponatremia Acute on chronic, slightly down post op.   - Hold fluids, furosemide - Trend Na  Chronic systolic CHF (congestive heart failure) (HCC) Appears dehydrated - Hold furosemide, Coreg, hydralazine  Peripheral vascular disease (HCC) - Resume Eliquis tomorrow - Continue atorvastatin and aspirin  Transaminitis LFTs slightly up post op - Trend LFTs - Continue atorvastatin for now  Coronary artery disease involving native coronary artery of native heart with angina pectoris (HCC) - Continue statin - Hold Imdur, BB for today given low BP  Anemia of chronic disease Hgb down to 7.9 transfused pre-op --> 9.2 > 8.1 today Hypoproliferative and iron sat 6% 1 week ago, suspect he needs some IV iron - Trend Hgb - Hold Eliquis one day, resume tomorrow - Trnasfusion threshold 7   S/P TAVR (transcatheter aortic valve replacement)    Stage IIIa chronic kidney  disease Baseline 1.5-1.6  DM2 (diabetes mellitus, type 2) (HCC) On low dose 70/30 only, glucose good here - Continue SS corrections   Essential hypertension Pressure soft - Hold Coreg, furosemide, hydralazine, Imdur  Hyperlipidemia - Continue Lipitor          Subjective: Patient is tired, had a lot of pain overnight but this is improved with morphine, no confusion, fever, bleeding.     Physical Exam: BP 126/89 (BP Location: Right Arm)   Pulse 98   Temp 97.9 F (36.6 C) (Oral)   Resp 18   Ht 5\' 7"  (1.702 m)   Wt 79.2 kg   SpO2 100%   BMI 27.35 kg/m   Thin adult male, sitting up in bed, interactive and appropriate Tachycardic, regular, no murmurs, no pitting edema Respiratory rate normal, lungs clear without rales or wheezes Hypertension normal, affect appropriate, judgment insight appear normal, mild generalized weakness, symmetric strength, right leg/ankle, no significant blood in the Prevena  Data Reviewed: CBC shows hemoglobin down to 8.1 Creatinine down to 1.5, sodium down to 127, LFTs slightly up  Family Communication: None present    Disposition: Status is: Inpatient Patient will need creatinine of his electrolytes, stabilization of blood level, can transfer to CIR probably early this week        Author: Alberteen Sam, MD 08/20/2022 11:47 AM  For on call review [email or web address].

## 2022-08-20 NOTE — Progress Notes (Signed)
Inpatient Rehab Admissions Coordinator:  Consult received. Await therapy evaluations and recommendations to help determine appropriate rehab venue.  Charron Coultas Graves Madden, MS, CCC-SLP Admissions Coordinator [phone]  

## 2022-08-20 NOTE — Assessment & Plan Note (Signed)
LFTs slightly up post op, better today  - Continue atorvastatin for now

## 2022-08-20 NOTE — Progress Notes (Addendum)
Patient has been receiving IV pain meds for pain due to surgical pain. Education was provided to patient on the proper management of pain by starting with PO meds and then advancing to IV if pain was not reduced. Patient requested around 1636 for pain medicine. PO was offered and patient declined for morphine. Patient stated that he was told by Dr. Maryfrances Bunnell that he could have as much morphine as necessary to treat the pain as long as he relays the information to Nurse Henderson Newcomer and then to the doctor. I notified Dr. Maryfrances Bunnell and he came by to speak with patient. Dr. Maryfrances Bunnell and I sat in the room with the patient as Dr. Maryfrances Bunnell explained the importance of using PO meds for pain to last such as the oxycodone that is ordered, and then using IV pain med as a fast method. Patient stated that he understood and there was no confusion. Patient called out at 1750, requesting pain medicine. I brought him the oral oxycodone med as we discussed together for lasting effects. Patient stated that he is allergic to oxycodone. In his chart, it states that it makes him nauseous and not truly an allery. I told him that he has zofran and that we can administer it together. He agreed and then later changed his mind stating that he would rather wait for the morphine. I asked him why he did not mention this during our discussion with the doctor, and he reported, "I didn't understand anything that was said during the conversation." Oxycodone has been replaced and MD has been notified. MD has agreed to change medicine to dilaudid.   Lawana Pai, RN

## 2022-08-21 DIAGNOSIS — N179 Acute kidney failure, unspecified: Secondary | ICD-10-CM | POA: Diagnosis not present

## 2022-08-21 DIAGNOSIS — M869 Osteomyelitis, unspecified: Secondary | ICD-10-CM | POA: Diagnosis not present

## 2022-08-21 DIAGNOSIS — I5022 Chronic systolic (congestive) heart failure: Secondary | ICD-10-CM | POA: Diagnosis not present

## 2022-08-21 DIAGNOSIS — I739 Peripheral vascular disease, unspecified: Secondary | ICD-10-CM | POA: Diagnosis not present

## 2022-08-21 LAB — CBC
HCT: 27.6 % — ABNORMAL LOW (ref 39.0–52.0)
Hemoglobin: 8.6 g/dL — ABNORMAL LOW (ref 13.0–17.0)
MCH: 25.9 pg — ABNORMAL LOW (ref 26.0–34.0)
MCHC: 31.2 g/dL (ref 30.0–36.0)
MCV: 83.1 fL (ref 80.0–100.0)
Platelets: 515 10*3/uL — ABNORMAL HIGH (ref 150–400)
RBC: 3.32 MIL/uL — ABNORMAL LOW (ref 4.22–5.81)
RDW: 16.2 % — ABNORMAL HIGH (ref 11.5–15.5)
WBC: 11 10*3/uL — ABNORMAL HIGH (ref 4.0–10.5)
nRBC: 0 % (ref 0.0–0.2)

## 2022-08-21 LAB — COMPREHENSIVE METABOLIC PANEL
ALT: 41 U/L (ref 0–44)
AST: 50 U/L — ABNORMAL HIGH (ref 15–41)
Albumin: 2.2 g/dL — ABNORMAL LOW (ref 3.5–5.0)
Alkaline Phosphatase: 114 U/L (ref 38–126)
Anion gap: 9 (ref 5–15)
BUN: 31 mg/dL — ABNORMAL HIGH (ref 8–23)
CO2: 23 mmol/L (ref 22–32)
Calcium: 8.6 mg/dL — ABNORMAL LOW (ref 8.9–10.3)
Chloride: 98 mmol/L (ref 98–111)
Creatinine, Ser: 1.28 mg/dL — ABNORMAL HIGH (ref 0.61–1.24)
GFR, Estimated: 59 mL/min — ABNORMAL LOW (ref >60)
Glucose, Bld: 137 mg/dL — ABNORMAL HIGH (ref 70–99)
Potassium: 4.6 mmol/L (ref 3.5–5.1)
Sodium: 130 mmol/L — ABNORMAL LOW (ref 135–145)
Total Bilirubin: 0.4 mg/dL (ref 0.3–1.2)
Total Protein: 7.6 g/dL (ref 6.5–8.1)

## 2022-08-21 LAB — GLUCOSE, CAPILLARY
Glucose-Capillary: 131 mg/dL — ABNORMAL HIGH (ref 70–99)
Glucose-Capillary: 110 mg/dL — ABNORMAL HIGH (ref 70–99)
Glucose-Capillary: 168 mg/dL — ABNORMAL HIGH (ref 70–99)
Glucose-Capillary: 203 mg/dL — ABNORMAL HIGH (ref 70–99)

## 2022-08-21 MED ORDER — ISOSORBIDE MONONITRATE ER 30 MG PO TB24
15.0000 mg | ORAL_TABLET | Freq: Every day | ORAL | Status: DC
  Administered 2022-08-21 – 2022-08-22 (×2): 15 mg via ORAL
  Filled 2022-08-21 (×2): qty 1

## 2022-08-21 MED ORDER — CARVEDILOL 3.125 MG PO TABS
3.1250 mg | ORAL_TABLET | Freq: Two times a day (BID) | ORAL | Status: DC
  Administered 2022-08-21 – 2022-08-22 (×2): 3.125 mg via ORAL
  Filled 2022-08-21 (×2): qty 1

## 2022-08-21 NOTE — PMR Pre-admission (Signed)
PMR Admission Coordinator Pre-Admission Assessment  Patient: Caleb [name] is an 72 y.o., male MRN: [id number] DOB: 11/24/1950 Height: 5\' 7"  (170.2 cm) Weight: 82.3 kg  Insurance Information HMO:     PPO:      PCP:      IPA:      80/20:      OTHER:  PRIMARY: [facility]     Policy#: [id number]      Subscriber: patient CM Name: Sherrilyn Rist      Phone#: [phone]     Fax#: [phone] Pre-Cert#: [id number]    Received authorization from [employer] on 08/22/22  Employer:  Benefits:  Phone #: [phone]     Name:  Eff. Date: 12/22/19     Deduct: NA      Out of Pocket Max: NA      Life Max: NA CIR: 100% coverage      SNF:  Outpatient:      Co-Pay:  Home Health:       Co-Pay:  DME:      Co-Pay:  Providers: in-network SECONDARY: [employer]       Policy#: [id number]     Phone#: [phone]  Financial Counselor:       Phone#:   The "Data Collection Information Summary" for patients in Inpatient Rehabilitation Facilities with attached "Privacy Act Statement-Health Care Records" was provided and verbally reviewed with: N/A  Emergency Contact Information Contact Information     Name Relation Home Work Mobile   [place] Cousin   [phone]   Dacotah, Goad Daughter   [phone]   [place], Barb Daughter   [phone]   Kailyn, Bergemann Daughter   [phone]       Current Medical History  Patient Admitting Diagnosis: osteomyelitis of RLE, s/p R BKA History of Present Illness: Pt is a 72 year old male with medical hx significant for: CHF, hyperlipidemia, HTN, GERD, DM II, carotid artery occlusion, hyponatremia, neuropathy, s/p TAVR (10/2021), h/o amputation of left 4th and 5th toes,  PVD. Pt presented to [facility] on 08/18/22 d/t worsening right foot pain.Pt has chronic right foot osteomyelitis with multiple revisions. Pt scheduled for right foot amputation on 08/19/22. X-ray negative for acute interval change. Pt's recent hospitalization at [facility] included AKI. Orthopedics  consulted. Pt underwent right BKA and application of wound VAC by Dr. Lajoyce Corners on 08/19/22. Therapy evaluations completed and CIR recommended d/t pt's deficits in functional mobility.    Patient's medical record from [facility] has been reviewed by the rehabilitation admission coordinator and physician.  Past Medical History  Past Medical History:  Diagnosis Date   Carotid artery occlusion    CHF (congestive heart failure) (HCC)    Diabetes mellitus without complication (HCC)    Type II   Dyslipidemia 09/27/2012   Erectile dysfunction 09/27/2012   GERD (gastroesophageal reflux disease)    Hypercholesteremia    Hyperlipidemia 08/12/2012   Hypertension    Hyponatremia 08/14/2012   MGUS (monoclonal gammopathy of unknown significance)    Neuropathy    Pancreatitis    Pancreatitis, acute 09/27/2012   Peripheral vascular disease (HCC)    S/P 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 Excell Seltzer & Bartle   Severe aortic stenosis    Vitamin D deficiency 06/23/2015    Has the patient had major surgery during 100 days prior to admission? Yes  Family History   family history includes Alcohol abuse in his father;  CAD in his father; CAD (age of onset: 47) in his brother; Colon polyps in his mother; Diabetes in his mother; Hypertension in his father.  Current Medications  Current Facility-Administered Medications:    0.9 %  sodium chloride infusion (Manually program via Guardrails IV Fluids), , Intravenous, Once, Nadara Mustard, MD   alum & mag hydroxide-simeth (MAALOX/MYLANTA) 200-200-20 MG/5ML suspension 15-30 mL, 15-30 mL, Oral, Q2H PRN, Nadara Mustard, MD   apixaban Everlene Balls) tablet 5 mg, 5 mg, Oral, BID, Danford, Earl Lites, MD, 5 mg at 08/22/22 1610   ascorbic acid (VITAMIN C) tablet 1,000 mg, 1,000 mg, Oral, Daily, Nadara Mustard, MD, 1,000 mg at 08/22/22 0915   aspirin EC tablet 81 mg, 81 mg, Oral, q  AM, Nadara Mustard, MD, 81 mg at 08/22/22 0806   atorvastatin (LIPITOR) tablet 40 mg, 40 mg, Oral, QHS, Nadara Mustard, MD, 40 mg at 08/21/22 2102   bisacodyl (DULCOLAX) EC tablet 5 mg, 5 mg, Oral, Daily PRN, Nadara Mustard, MD   carvedilol (COREG) tablet 3.125 mg, 3.125 mg, Oral, BID WC, Danford, Earl Lites, MD, 3.125 mg at 08/22/22 9604   docusate sodium (COLACE) capsule 100 mg, 100 mg, Oral, Daily, Nadara Mustard, MD, 100 mg at 08/22/22 0915   feeding supplement (NEPRO CARB STEADY) liquid 237 mL, 237 mL, Oral, BID BM, Danford, Earl Lites, MD, 237 mL at 08/22/22 1157   ferric gluconate (FERRLECIT) 125 mg in sodium chloride 0.9 % 100 mL IVPB, 125 mg, Intravenous, Daily, Danford, Earl Lites, MD, Last Rate: 110 mL/hr at 08/22/22 0920, 125 mg at 08/22/22 0920   gabapentin (NEURONTIN) capsule 300 mg, 300 mg, Oral, TID, Nadara Mustard, MD, 300 mg at 08/22/22 0915   guaiFENesin-dextromethorphan (ROBITUSSIN DM) 100-10 MG/5ML syrup 15 mL, 15 mL, Oral, Q4H PRN, Nadara Mustard, MD   hydrALAZINE (APRESOLINE) injection 5 mg, 5 mg, Intravenous, Q20 Min PRN, Nadara Mustard, MD   HYDROcodone-acetaminophen (NORCO/VICODIN) 5-325 MG per tablet 1 tablet, 1 tablet, Oral, Q6H PRN, Alberteen Sam, MD, 1 tablet at 08/20/22 2041   HYDROmorphone (DILAUDID) injection 1 mg, 1 mg, Intravenous, Q4H PRN, Danford, Earl Lites, MD, 1 mg at 08/22/22 0806   insulin aspart (novoLOG) injection 0-5 Units, 0-5 Units, Subcutaneous, QHS, Nadara Mustard, MD, 2 Units at 08/21/22 2211   insulin aspart (novoLOG) injection 0-9 Units, 0-9 Units, Subcutaneous, TID WC, Nadara Mustard, MD, 1 Units at 08/22/22 1156   isosorbide mononitrate (IMDUR) 24 hr tablet 15 mg, 15 mg, Oral, Daily, Danford, Earl Lites, MD, 15 mg at 08/22/22 0915   labetalol (NORMODYNE) injection 10 mg, 10 mg, Intravenous, Q10 min PRN, Nadara Mustard, MD   magnesium citrate solution 1 Bottle, 1 Bottle, Oral, Once PRN, Nadara Mustard, MD   magnesium  sulfate IVPB 2 g 50 mL, 2 g, Intravenous, Daily PRN, Nadara Mustard, MD   melatonin tablet 3 mg, 3 mg, Oral, QHS PRN, Nadara Mustard, MD, 3 mg at 08/21/22 2102   metoprolol tartrate (LOPRESSOR) injection 2-5 mg, 2-5 mg, Intravenous, Q2H PRN, Nadara Mustard, MD   naloxone [facility]) injection 0.4 mg, 0.4 mg, Intravenous, PRN, Nadara Mustard, MD   nutrition supplement (JUVEN) (JUVEN) powder packet 1 packet, 1 packet, Oral, BID BM, Nadara Mustard, MD, 1 packet at 08/22/22 0915   ondansetron [facility]) injection 4 mg, 4 mg, Intravenous, Q6H PRN, Nadara Mustard, MD, 4 mg at 08/20/22 2041   pantoprazole (PROTONIX) EC tablet 40  mg, 40 mg, Oral, Daily, Nadara Mustard, MD, 40 mg at 08/22/22 0915   phenol (CHLORASEPTIC) mouth spray 1 spray, 1 spray, Mouth/Throat, PRN, Nadara Mustard, MD   polyethylene glycol (MIRALAX / GLYCOLAX) packet 17 g, 17 g, Oral, Daily PRN, Nadara Mustard, MD   potassium chloride SA (KLOR-CON M) CR tablet 20-40 mEq, 20-40 mEq, Oral, Daily PRN, Nadara Mustard, MD   zinc sulfate capsule 220 mg, 220 mg, Oral, Daily, Nadara Mustard, MD, 220 mg at 08/22/22 0915  Patients Current Diet:  Diet Order             Diet Heart Room service appropriate? Yes; Fluid consistency: Thin  Diet effective now                   Precautions / Restrictions Precautions Precautions: Fall Precaution Comments: fall, R BKA with wound vac; hx of LLE 4th and 5th digit amputation Other Brace: limb protector Restrictions Weight Bearing Restrictions: Yes RLE Weight Bearing: Non weight bearing   Has the patient had 2 or more falls or a fall with injury in the past year? No  Prior Activity Level Community (5-7x/wk): drives, gets out of house often  Prior Functional Level Self Care: Did the patient need help bathing, dressing, using the toilet or eating? Independent  Indoor Mobility: Did the patient need assistance with walking from room to room (with or without device)? Independent  Stairs: Did the  patient need assistance with internal or external stairs (with or without device)? Pt reports he avoids stairs  Functional Cognition: Did the patient need help planning regular tasks such as shopping or remembering to take medications? Needed some help  Patient Information Are you of Hispanic, Latino/a,or Spanish origin?: A. No, not of Hispanic, Latino/a, or Spanish origin What is your race?: B. Black or African American Do you need or want an interpreter to communicate with a doctor or health care staff?: 0. No  Patient's Response To:  Health Literacy and Transportation Is the patient able to respond to health literacy and transportation needs?: Yes Health Literacy - How often do you need to have someone help you when you read instructions, pamphlets, or other written material from your doctor or pharmacy?: Never In the past 12 months, has lack of transportation kept you from medical appointments or from getting medications?: No In the past 12 months, has lack of transportation kept you from meetings, work, or from getting things needed for daily living?: No  Journalist, newspaper / Equipment Home Assistive Devices/Equipment: Wheelchair Home Equipment: Crutches, Medical laboratory scientific officer - single point, Agricultural consultant (2 wheels), Wheelchair - manual  Prior Device Use: Indicate devices/aids used by the patient prior to current illness, exacerbation or injury? Manual wheelchair and Walker  Current Functional Level Cognition  Overall Cognitive Status: Within Functional Limits for tasks assessed Orientation Level: Oriented X4 General Comments: Pleasant and agreeable throughout session    Extremity Assessment (includes Sensation/Coordination)  Upper Extremity Assessment: Overall WFL for tasks assessed (neuropathy bilat hands) RUE Deficits / Details: Decreased dexterity noted secondary to diabetic neuropathy. To be further assessed. LUE Deficits / Details: Decreased dexterity noted secondary to diabetic  neuropathy. To be further assessed.  Lower Extremity Assessment: Defer to PT evaluation RLE Deficits / Details: S/p BKA. At least anti gravity strength LLE Deficits / Details: Grossly 5/5 strength, hx of 4th and 5th toe amputations    ADLs  Overall ADL's : Needs assistance/impaired Eating/Feeding: Set up, Sitting Grooming: Set up, Sitting  Upper Body Bathing: Sitting, Set up Lower Body Bathing: Min guard, Sitting/lateral leans Upper Body Dressing : Set up, Sitting Lower Body Dressing: Min guard, Sitting/lateral leans Lower Body Dressing Details (indicate cue type and reason): Pt donned underwear while sitting/lateral leaning at EOB with min guard A and assist for threading wound vac Toilet Transfer: Min guard (lateral scoot transfer) Toilet Transfer Details (indicate cue type and reason): simulated Toileting- Clothing Manipulation and Hygiene: Min guard, Sitting/lateral lean Functional mobility during ADLs: Min guard General ADL Comments: Pt educated on use of lateral leaning for LB bathing/dressing, pt demonstrated understanding.    Mobility  Overal bed mobility: Needs Assistance Bed Mobility: Supine to Sit Supine to sit: Supervision, HOB elevated Sit to supine: Supervision General bed mobility comments: HOB elevated, use of bed rail    Transfers  Overall transfer level: Needs assistance Equipment used: None Transfers: Bed to chair/wheelchair/BSC Sit to Stand: +2 physical assistance, +2 safety/equipment, Min assist Bed to/from chair/wheelchair/BSC transfer type:: Lateral/scoot transfer Stand pivot transfers: Mod assist, +2 physical assistance, +2 safety/equipment  Lateral/Scoot Transfers: Min guard General transfer comment: Pt completed lateral scoot transfer from EOB>chair with min guard A. Pt reports lateral scooting as his baseline.    Ambulation / Gait / Stairs / Wheelchair Mobility  Ambulation/Gait General Gait Details: unable    Posture / Balance Dynamic Sitting  Balance Sitting balance - Comments: sitting EOB Balance Overall balance assessment: Needs assistance Sitting-balance support: No upper extremity supported, Feet supported Sitting balance-Leahy Scale: Good Sitting balance - Comments: sitting EOB Standing balance support: Bilateral upper extremity supported, Reliant on assistive device for balance Standing balance-Leahy Scale: Poor Standing balance comment: reliant on BUE support on RW for balance    Special needs/care consideration Wound Vac leg/right and Diabetic management Novolog 0-5 units at bedtime; Novolog 0-9 units 3x daily at meals   Previous Home Environment (from acute therapy documentation) Living Arrangements: Alone Available Help at Discharge: Family, Personal care attendant Type of Home: Apartment Home Layout: One level Home Access: Level entry Bathroom Shower/Tub: Engineer, manufacturing systems: Standard Bathroom Accessibility: Yes How Accessible: Accessible via walker, Accessible via wheelchair Home Care Services: Yes Type of Home Care Services: Home PT, Home OT, Home RN Home Care Agency (if known): [facility] Additional Comments: HH nurse assists with medication management, cousin comes 1x week to bring meals and complete housekeeping. Pt plans on moving in with family in [place] within 5-8 months.  Discharge Living Setting Plans for Discharge Living Setting: House Type of Home at Discharge: House Discharge Home Layout: One level Discharge Home Access: Stairs to enter Entrance Stairs-Rails: None Entrance Stairs-Number of Steps: 4 in front, 2 in back Discharge Bathroom Shower/Tub: Tub/shower unit Discharge Bathroom Toilet: Standard Discharge Bathroom Accessibility: Yes How Accessible: Accessible via walker Does the patient have any problems obtaining your medications?: No  Social/Family/Support Systems Anticipated Caregiver: Mar[place]e [place] (cousin) and other family Anticipated Caregiver's Contact Information: Mindi Junker:  [phone] Caregiver Availability: 24/7 Discharge Plan Discussed with Primary Caregiver: Yes Is Caregiver In Agreement with Plan?: Yes Does Caregiver/Family have Issues with Lodging/Transportation while Pt is in Rehab?: No  Goals Patient/Family Goal for Rehab: Supervision: PT/OT Expected length of stay: 7-10 days  Decrease burden of Care through IP rehab admission: NA  Possible need for SNF placement upon discharge: Not anticipated  Patient Condition: I have reviewed medical records from [facility], spoken with CM, and patient and family member. I met with patient at the bedside and discussed via phone for inpatient rehabilitation assessment.  Patient will benefit from ongoing PT and OT, can actively participate in 3 hours of therapy a day 5 days of the week, and can make measurable gains during the admission.  Patient will also benefit from the coordinated team approach during an Inpatient Acute Rehabilitation admission.  The patient will receive intensive therapy as well as Rehabilitation physician, nursing, social worker, and care management interventions.  Due to safety, skin/wound care, disease management, medication administration, pain management, and patient education the patient requires 24 hour a day rehabilitation nursing.  The patient is currently Min A with mobility and basic ADLs.  Discharge setting and therapy post discharge at home with home health is anticipated.  Patient has agreed to participate in the Acute Inpatient Rehabilitation Program and will admit today.  Preadmission Screen Completed By:  Domingo Pulse, 08/22/2022 12:22 PM ______________________________________________________________________   Discussed status with Dr. Wynn Banker on 08/22/22  at 3:20 PM and received approval for admission today.  Admission Coordinator:  Domingo Pulse, CCC-SLP, time 3:20 PM/Date 08/22/22    Assessment/Plan: Diagnosis:  RIght BKA due to osteomyelitis Does  the need for close, 24 hr/day Medical supervision in concert with the patient's rehab needs make it unreasonable for this patient to be served in a less intensive setting? Yes Co-Morbidities requiring supervision/potential complications: CHF (EF 25-30%), Aortic stenosis s/p TAVR, Diabetes with severe neuropathy , PVD Due to bladder management, bowel management, safety, skin/wound care, disease management, medication administration, pain management, and patient education, does the patient require 24 hr/day rehab nursing? Yes Does the patient require coordinated care of a physician, rehab nurse, PT, OT, and SLP to address physical and functional deficits in the context of the above medical diagnosis(es)? Yes Addressing deficits in the following areas: balance, endurance, locomotion, strength, transferring, bowel/bladder control, bathing, dressing, feeding, toileting, and psychosocial support Can the patient actively participate in an intensive therapy program of at least 3 hrs of therapy 5 days a week? Yes The potential for patient to make measurable gains while on inpatient rehab is excellent Anticipated functional outcomes upon discharge from inpatient rehab: modified independent and supervision PT, modified independent and supervision OT, n/a SLP Estimated rehab length of stay to reach the above functional goals is: 7-10d Anticipated discharge destination: Home 10. Overall Rehab/Functional Prognosis: good   MD Signature: Erick Colace M.D. [facility] Fellow Am Acad of Phys Med and Rehab Diplomate Am Board of Electrodiagnostic Med Fellow Am Board of Interventional Pain

## 2022-08-21 NOTE — Progress Notes (Signed)
Inpatient Rehab Admissions:  Inpatient Rehab Consult received.  I met with patient at the bedside for rehabilitation assessment and to discuss goals and expectations of an inpatient rehab admission.  Discussed average length of stay, insurance authorization requirement, discharge home after completion of CIR. Pt acknowledged understanding and is interested in pursuing CIR. Pt gave permission to contact  cousin Mindi Junker. Spoke with Mindi Junker on the telephone. She also acknowledged understanding of CIR goals and expectations. She is supportive of pt pursuing CIR. She confirmed she will be able to provide intermittent support for pt after discharge. She is going to discuss arranging further support for pt with family. Mindi Junker is also going to contact pt's children and have them get in contact with [facility]. Will continue to follow.  Signed: Wolfgang Phoenix, MS, CCC-SLP Admissions Coordinator [phone]

## 2022-08-21 NOTE — Plan of Care (Signed)
Patient is resting in bed with WNL vitals and respirations. All meds have been given. Priority is to stay on top of pain and promote movement. Patient has been requesting only IV dilaudid. Patient has been made aware of other options of pain such as oral and he declined. Patient has passed the incentive spirometer test. Plan of care is ongoing.  Problem: Education: Goal: Knowledge of the prescribed therapeutic regimen will improve Outcome: Progressing Goal: Ability to verbalize activity precautions or restrictions will improve Outcome: Progressing Goal: Understanding of discharge needs will improve Outcome: Progressing   Problem: Activity: Goal: Ability to perform//tolerate increased activity and mobilize with assistive devices will improve Outcome: Progressing   Problem: Clinical Measurements: Goal: Postoperative complications will be avoided or minimized Outcome: Progressing   Problem: Self-Care: Goal: Ability to meet self-care needs will improve Outcome: Progressing   Problem: Self-Concept: Goal: Ability to maintain and perform role responsibilities to the fullest extent possible will improve Outcome: Progressing   Problem: Pain Management: Goal: Pain level will decrease with appropriate interventions Outcome: Progressing   Problem: Skin Integrity: Goal: Demonstration of wound healing without infection will improve Outcome: Progressing   Problem: Education: Goal: Knowledge of General Education information will improve Description: Including pain rating scale, medication(s)/side effects and non-pharmacologic comfort measures Outcome: Progressing   Problem: Health Behavior/Discharge Planning: Goal: Ability to manage health-related needs will improve Outcome: Progressing   Problem: Clinical Measurements: Goal: Ability to maintain clinical measurements within normal limits will improve Outcome: Progressing Goal: Will remain free from infection Outcome: Progressing Goal:  Diagnostic test results will improve Outcome: Progressing Goal: Respiratory complications will improve Outcome: Progressing Goal: Cardiovascular complication will be avoided Outcome: Progressing   Problem: Activity: Goal: Risk for activity intolerance will decrease Outcome: Progressing   Problem: Nutrition: Goal: Adequate nutrition will be maintained Outcome: Progressing   Problem: Coping: Goal: Level of anxiety will decrease Outcome: Progressing   Problem: Elimination: Goal: Will not experience complications related to bowel motility Outcome: Progressing Goal: Will not experience complications related to urinary retention Outcome: Progressing   Problem: Pain Managment: Goal: General experience of comfort will improve Outcome: Progressing   Problem: Safety: Goal: Ability to remain free from injury will improve Outcome: Progressing   Problem: Skin Integrity: Goal: Risk for impaired skin integrity will decrease Outcome: Progressing   Problem: Education: Goal: Ability to describe self-care measures that may prevent or decrease complications (Diabetes Survival Skills Education) will improve Outcome: Progressing Goal: Individualized Educational Video(s) Outcome: Progressing   Problem: Coping: Goal: Ability to adjust to condition or change in health will improve Outcome: Progressing   Problem: Fluid Volume: Goal: Ability to maintain a balanced intake and output will improve Outcome: Progressing   Problem: Health Behavior/Discharge Planning: Goal: Ability to identify and utilize available resources and services will improve Outcome: Progressing Goal: Ability to manage health-related needs will improve Outcome: Progressing   Problem: Metabolic: Goal: Ability to maintain appropriate glucose levels will improve Outcome: Progressing   Problem: Nutritional: Goal: Maintenance of adequate nutrition will improve Outcome: Progressing Goal: Progress toward achieving an  optimal weight will improve Outcome: Progressing   Problem: Skin Integrity: Goal: Risk for impaired skin integrity will decrease Outcome: Progressing   Problem: Tissue Perfusion: Goal: Adequacy of tissue perfusion will improve Outcome: Progressing

## 2022-08-21 NOTE — Progress Notes (Signed)
Progress Note   Patient: Caleb [name]:[id number] DOB: 12-19-1950 DOA: 08/18/2022     3 DOS: the patient was seen and examined on 08/21/2022        Brief hospital course: Mr. Dunsworth is a 72 y.o. M with DM, HTN, HLD, PVD (bilateral TCAR, bilateral popliteal artery stents, right fem-tib bypass, and hx amputation of L 4th and 5th toes as well as right partial calcaneus excision), sCHF EF 20%, AV stenosis s/p TAVR 10/2021 who was admitted twice for failure to heal his right partial calcaneus excision wound, and was planned for salvage BKA on 5/31 but presented one day early due to untreated pain.     Assessment and Plan: * Osteomyelitis of right lower extremity (HCC) Status post right below-knee amputation and application of Kerecis micrograft and Prevena and stump shrinker on 5/31 by Dr. Lajoyce Corners - PT/OT - Analgesics for post-op pain    AKI (acute kidney injury) (HCC) Cr 2.1 on admission, baseline is 1.5 Cr resolved to baseline post-op and post-transfusion. - Hold furosemide - Avoid nephrotoxins, hypotension   Chronic hyponatremia Acute on chronic, slightly down post op.   Improved today. - Hold furosemide today - Trend Na  Chronic systolic CHF (congestive heart failure) (HCC) Fluid status improving - Resume Coreg - Hold furosemide, hydralazine  Peripheral vascular disease (HCC) - Resume Eliquis - Continue atorvastatin and aspirin  Transaminitis LFTs slightly up post op, better today  - Continue atorvastatin for now  Coronary artery disease involving native coronary artery of native heart with angina pectoris (HCC) - Continue statin - Resume BB, Imdur   Anemia of chronic disease Hgb down to 7.9 transfused pre-op --> 9.2 > 8.1 > 8.6 today Hypoproliferative and iron sat 6% 1 week ago, suspect he needs some IV iron - IV iron for 3 more days - Resume Eliquis and trend Hgg - Transfusion threshold 7 g/dL   S/P TAVR (transcatheter aortic valve replacement)    Stage  IIIa chronic kidney disease Baseline 1.5-1.6  DM2 (diabetes mellitus, type 2) (HCC) On low dose 70/30 only, glucose good here - Continue SS corrections   Essential hypertension BP slightly up - Resume Coreg, Imdur - Hold furosemide, hydralazine   Hyperlipidemia - Continue Lipitor          Subjective: Patient's pain is well-controlled, he had no bleeding, no confusion, no respiratory symptoms.     Physical Exam: BP (!) 142/96 (BP Location: Left Arm)   Pulse 100   Temp 98.1 F (36.7 C) (Oral)   Resp 18   Ht 5\' 7"  (1.702 m)   Wt 81.3 kg   SpO2 97%   BMI 28.07 kg/m   Thin adult male, sleeping, arouses easily, interactive and appropriate Tachycardic, regular, no murmurs, no pitting edema in the remaining leg Respiratory rate normal, lungs clear without rales or wheezes Abdomen soft without tenderness palpation or guarding Attention normal, affect normal, judgment insight appear normal, mild generalized weakness, symmetric strength     Data Reviewed: Patient metabolic panel shows sodium up to 130, creatinine down to 1.2 CBC shows hemoglobin up to 8.6, white blood cell count 11   Family Communication: None present    Disposition: Status is: Inpatient The patient was admitted for unrelenting pain and gangrene of the right foot, he is now status post amputation, and is medically stable for discharge.  Because of his amputation he will need rehabilitation and return to his independent level of function, and would not have safe disposition yet, social work  is working on it, he is medically ready when bed is available        Author: Alberteen Sam, MD 08/21/2022 10:00 AM  For on call review [email or web address].

## 2022-08-22 ENCOUNTER — Encounter (HOSPITAL_COMMUNITY): Payer: Self-pay | Admitting: Physical Medicine and Rehabilitation

## 2022-08-22 ENCOUNTER — Other Ambulatory Visit: Payer: Self-pay

## 2022-08-22 ENCOUNTER — Inpatient Hospital Stay (HOSPITAL_COMMUNITY)
Admission: RE | Admit: 2022-08-22 | Discharge: 2022-09-05 | DRG: 092 | Disposition: A | Discharge status: Home Health | Payer: No Typology Code available for payment source | Source: Intra-hospital | Attending: Physical Medicine and Rehabilitation | Admitting: Physical Medicine and Rehabilitation

## 2022-08-22 DIAGNOSIS — Z952 Presence of prosthetic heart valve: Secondary | ICD-10-CM

## 2022-08-22 DIAGNOSIS — E1169 Type 2 diabetes mellitus with other specified complication: Secondary | ICD-10-CM | POA: Diagnosis present

## 2022-08-22 DIAGNOSIS — E7800 Pure hypercholesterolemia, unspecified: Secondary | ICD-10-CM | POA: Diagnosis present

## 2022-08-22 DIAGNOSIS — Z794 Long term (current) use of insulin: Secondary | ICD-10-CM | POA: Diagnosis not present

## 2022-08-22 DIAGNOSIS — M86671 Other chronic osteomyelitis, right ankle and foot: Secondary | ICD-10-CM | POA: Diagnosis present

## 2022-08-22 DIAGNOSIS — R2689 Other abnormalities of gait and mobility: Principal | ICD-10-CM | POA: Diagnosis present

## 2022-08-22 DIAGNOSIS — E1122 Type 2 diabetes mellitus with diabetic chronic kidney disease: Secondary | ICD-10-CM | POA: Diagnosis present

## 2022-08-22 DIAGNOSIS — Z89511 Acquired absence of right leg below knee: Secondary | ICD-10-CM | POA: Diagnosis not present

## 2022-08-22 DIAGNOSIS — Z7982 Long term (current) use of aspirin: Secondary | ICD-10-CM | POA: Diagnosis not present

## 2022-08-22 DIAGNOSIS — I2510 Atherosclerotic heart disease of native coronary artery without angina pectoris: Secondary | ICD-10-CM | POA: Diagnosis present

## 2022-08-22 DIAGNOSIS — I25119 Atherosclerotic heart disease of native coronary artery with unspecified angina pectoris: Secondary | ICD-10-CM

## 2022-08-22 DIAGNOSIS — R7401 Elevation of levels of liver transaminase levels: Secondary | ICD-10-CM | POA: Diagnosis present

## 2022-08-22 DIAGNOSIS — N179 Acute kidney failure, unspecified: Secondary | ICD-10-CM | POA: Diagnosis not present

## 2022-08-22 DIAGNOSIS — R269 Unspecified abnormalities of gait and mobility: Secondary | ICD-10-CM | POA: Diagnosis present

## 2022-08-22 DIAGNOSIS — K219 Gastro-esophageal reflux disease without esophagitis: Secondary | ICD-10-CM | POA: Diagnosis present

## 2022-08-22 DIAGNOSIS — N1831 Chronic kidney disease, stage 3a: Secondary | ICD-10-CM | POA: Diagnosis present

## 2022-08-22 DIAGNOSIS — D75839 Thrombocytosis, unspecified: Secondary | ICD-10-CM | POA: Diagnosis present

## 2022-08-22 DIAGNOSIS — G546 Phantom limb syndrome with pain: Secondary | ICD-10-CM | POA: Diagnosis present

## 2022-08-22 DIAGNOSIS — E1140 Type 2 diabetes mellitus with diabetic neuropathy, unspecified: Secondary | ICD-10-CM | POA: Diagnosis present

## 2022-08-22 DIAGNOSIS — I5022 Chronic systolic (congestive) heart failure: Secondary | ICD-10-CM | POA: Diagnosis not present

## 2022-08-22 DIAGNOSIS — R5381 Other malaise: Secondary | ICD-10-CM | POA: Diagnosis present

## 2022-08-22 DIAGNOSIS — E875 Hyperkalemia: Secondary | ICD-10-CM | POA: Diagnosis present

## 2022-08-22 DIAGNOSIS — I130 Hypertensive heart and chronic kidney disease with heart failure and stage 1 through stage 4 chronic kidney disease, or unspecified chronic kidney disease: Secondary | ICD-10-CM | POA: Diagnosis present

## 2022-08-22 DIAGNOSIS — Z7901 Long term (current) use of anticoagulants: Secondary | ICD-10-CM

## 2022-08-22 DIAGNOSIS — S88111A Complete traumatic amputation at level between knee and ankle, right lower leg, initial encounter: Secondary | ICD-10-CM | POA: Diagnosis present

## 2022-08-22 DIAGNOSIS — D72829 Elevated white blood cell count, unspecified: Secondary | ICD-10-CM | POA: Diagnosis present

## 2022-08-22 DIAGNOSIS — G548 Other nerve root and plexus disorders: Secondary | ICD-10-CM | POA: Diagnosis not present

## 2022-08-22 DIAGNOSIS — D638 Anemia in other chronic diseases classified elsewhere: Secondary | ICD-10-CM | POA: Diagnosis present

## 2022-08-22 DIAGNOSIS — M869 Osteomyelitis, unspecified: Secondary | ICD-10-CM | POA: Diagnosis not present

## 2022-08-22 DIAGNOSIS — G5701 Lesion of sciatic nerve, right lower limb: Secondary | ICD-10-CM | POA: Diagnosis present

## 2022-08-22 DIAGNOSIS — K5900 Constipation, unspecified: Secondary | ICD-10-CM | POA: Diagnosis not present

## 2022-08-22 DIAGNOSIS — Z79899 Other long term (current) drug therapy: Secondary | ICD-10-CM | POA: Diagnosis not present

## 2022-08-22 DIAGNOSIS — E871 Hypo-osmolality and hyponatremia: Secondary | ICD-10-CM | POA: Diagnosis present

## 2022-08-22 DIAGNOSIS — N183 Chronic kidney disease, stage 3 unspecified: Secondary | ICD-10-CM

## 2022-08-22 LAB — CBC
HCT: 26.3 % — ABNORMAL LOW (ref 39.0–52.0)
Hemoglobin: 8.5 g/dL — ABNORMAL LOW (ref 13.0–17.0)
MCH: 26.8 pg (ref 26.0–34.0)
MCHC: 32.3 g/dL (ref 30.0–36.0)
MCV: 83 fL (ref 80.0–100.0)
Platelets: 557 10*3/uL — ABNORMAL HIGH (ref 150–400)
RBC: 3.17 MIL/uL — ABNORMAL LOW (ref 4.22–5.81)
RDW: 16.1 % — ABNORMAL HIGH (ref 11.5–15.5)
WBC: 10.6 10*3/uL — ABNORMAL HIGH (ref 4.0–10.5)
nRBC: 0 % (ref 0.0–0.2)

## 2022-08-22 LAB — SURGICAL PATHOLOGY

## 2022-08-22 LAB — GLUCOSE, CAPILLARY
Glucose-Capillary: 133 mg/dL — ABNORMAL HIGH (ref 70–99)
Glucose-Capillary: 140 mg/dL — ABNORMAL HIGH (ref 70–99)
Glucose-Capillary: 211 mg/dL — ABNORMAL HIGH (ref 70–99)
Glucose-Capillary: 157 mg/dL — ABNORMAL HIGH (ref 70–99)

## 2022-08-22 MED ORDER — VITAMIN C 500 MG PO TABS
1000.0000 mg | ORAL_TABLET | Freq: Every day | ORAL | Status: DC
  Administered 2022-08-23 – 2022-09-05 (×14): 1000 mg via ORAL
  Filled 2022-08-22 (×14): qty 2

## 2022-08-22 MED ORDER — FERROUS SULFATE 325 (65 FE) MG PO TABS: 325.0000 mg | ORAL_TABLET | ORAL | 3 refills | Status: DC

## 2022-08-22 MED ORDER — BISACODYL 10 MG RE SUPP: 10.0000 mg | Freq: Every day | RECTAL | Status: DC | PRN

## 2022-08-22 MED ORDER — PANTOPRAZOLE SODIUM 40 MG PO TBEC
40.0000 mg | DELAYED_RELEASE_TABLET | Freq: Every day | ORAL | Status: DC
  Administered 2022-08-23 – 2022-09-05 (×14): 40 mg via ORAL
  Filled 2022-08-22 (×14): qty 1

## 2022-08-22 MED ORDER — DOCUSATE SODIUM 100 MG PO CAPS: 100.0000 mg | ORAL_CAPSULE | Freq: Every day | ORAL | 0 refills | Status: DC

## 2022-08-22 MED ORDER — ACETAMINOPHEN 325 MG PO TABS
325.0000 mg | ORAL_TABLET | ORAL | Status: DC | PRN
  Administered 2022-08-26 – 2022-09-02 (×2): 650 mg via ORAL
  Filled 2022-08-22 (×2): qty 2

## 2022-08-22 MED ORDER — PROCHLORPERAZINE MALEATE 5 MG PO TABS
5.0000 mg | ORAL_TABLET | Freq: Four times a day (QID) | ORAL | Status: DC | PRN
  Administered 2022-08-22: 10 mg via ORAL
  Filled 2022-08-22: qty 2

## 2022-08-22 MED ORDER — ALUM & MAG HYDROXIDE-SIMETH 200-200-20 MG/5ML PO SUSP: 15.0000 mL | ORAL | 0 refills | Status: DC | PRN

## 2022-08-22 MED ORDER — FLEET ENEMA 7-19 GM/118ML RE ENEM: 1.0000 | ENEMA | Freq: Once | RECTAL | Status: DC | PRN

## 2022-08-22 MED ORDER — FERROUS SULFATE 325 (65 FE) MG PO TBEC: 325.0000 mg | DELAYED_RELEASE_TABLET | Freq: Two times a day (BID) | ORAL | 3 refills | Status: DC

## 2022-08-22 MED ORDER — NEPRO/CARBSTEADY PO LIQD
237.0000 mL | Freq: Two times a day (BID) | ORAL | Status: DC
  Administered 2022-08-22 (×2): 237 mL via ORAL

## 2022-08-22 MED ORDER — BISACODYL 5 MG PO TBEC: 5.0000 mg | DELAYED_RELEASE_TABLET | Freq: Every day | ORAL | 0 refills | Status: DC | PRN

## 2022-08-22 MED ORDER — JUVEN PO PACK
1.0000 | PACK | Freq: Two times a day (BID) | ORAL | Status: DC
  Administered 2022-08-23 – 2022-08-27 (×10): 1 via ORAL
  Filled 2022-08-22 (×6): qty 1

## 2022-08-22 MED ORDER — INSULIN ASPART 100 UNIT/ML IJ SOLN
0.0000 [IU] | Freq: Every day | INTRAMUSCULAR | Status: DC
  Administered 2022-09-02: 2 [IU] via SUBCUTANEOUS

## 2022-08-22 MED ORDER — MELATONIN 5 MG PO TABS: 5.0000 mg | ORAL_TABLET | Freq: Every evening | ORAL | Status: DC | PRN

## 2022-08-22 MED ORDER — PROCHLORPERAZINE 25 MG RE SUPP: 12.5000 mg | Freq: Four times a day (QID) | RECTAL | Status: DC | PRN

## 2022-08-22 MED ORDER — NEPRO/CARBSTEADY PO LIQD
237.0000 mL | Freq: Two times a day (BID) | ORAL | Status: DC
  Administered 2022-08-23 – 2022-08-29 (×11): 237 mL via ORAL

## 2022-08-22 MED ORDER — INSULIN ASPART 100 UNIT/ML IJ SOLN
0.0000 [IU] | Freq: Three times a day (TID) | INTRAMUSCULAR | Status: DC
  Administered 2022-08-23: 1 [IU] via SUBCUTANEOUS
  Administered 2022-08-23 – 2022-08-25 (×6): 2 [IU] via SUBCUTANEOUS
  Administered 2022-08-25: 3 [IU] via SUBCUTANEOUS
  Administered 2022-08-25 – 2022-08-26 (×2): 2 [IU] via SUBCUTANEOUS
  Administered 2022-08-26: 3 [IU] via SUBCUTANEOUS
  Administered 2022-08-26: 2 [IU] via SUBCUTANEOUS
  Administered 2022-08-27: 1 [IU] via SUBCUTANEOUS
  Administered 2022-08-27 (×2): 2 [IU] via SUBCUTANEOUS
  Administered 2022-08-28: 1 [IU] via SUBCUTANEOUS
  Administered 2022-08-28 – 2022-08-29 (×2): 2 [IU] via SUBCUTANEOUS
  Administered 2022-08-29: 1 [IU] via SUBCUTANEOUS
  Administered 2022-08-29 – 2022-08-30 (×2): 2 [IU] via SUBCUTANEOUS
  Administered 2022-08-30: 1 [IU] via SUBCUTANEOUS
  Administered 2022-08-30 – 2022-08-31 (×2): 2 [IU] via SUBCUTANEOUS
  Administered 2022-08-31 – 2022-09-02 (×6): 1 [IU] via SUBCUTANEOUS
  Administered 2022-09-03: 3 [IU] via SUBCUTANEOUS
  Administered 2022-09-03: 1 [IU] via SUBCUTANEOUS
  Administered 2022-09-04: 2 [IU] via SUBCUTANEOUS
  Administered 2022-09-04: 1 [IU] via SUBCUTANEOUS

## 2022-08-22 MED ORDER — BENEPROTEIN PO POWD
1.0000 | Freq: Three times a day (TID) | ORAL | Status: DC
  Administered 2022-08-23 – 2022-09-05 (×39): 6 g via ORAL
  Filled 2022-08-22: qty 227

## 2022-08-22 MED ORDER — SODIUM CHLORIDE 0.9 % IV SOLN
125.0000 mg | Freq: Every day | INTRAVENOUS | Status: AC
  Administered 2022-08-23: 125 mg via INTRAVENOUS
  Filled 2022-08-22: qty 10

## 2022-08-22 MED ORDER — ASPIRIN 81 MG PO TBEC
81.0000 mg | DELAYED_RELEASE_TABLET | Freq: Every morning | ORAL | Status: DC
  Administered 2022-08-23 – 2022-09-05 (×14): 81 mg via ORAL
  Filled 2022-08-22 (×14): qty 1

## 2022-08-22 MED ORDER — ATORVASTATIN CALCIUM 40 MG PO TABS
40.0000 mg | ORAL_TABLET | Freq: Every day | ORAL | Status: DC
  Administered 2022-08-22 – 2022-09-04 (×14): 40 mg via ORAL
  Filled 2022-08-22 (×14): qty 1

## 2022-08-22 MED ORDER — PHENOL 1.4 % MT LIQD: 1.0000 | OROMUCOSAL | Status: DC | PRN

## 2022-08-22 MED ORDER — APIXABAN 5 MG PO TABS
5.0000 mg | ORAL_TABLET | Freq: Two times a day (BID) | ORAL | Status: DC
  Administered 2022-08-22 – 2022-09-05 (×28): 5 mg via ORAL
  Filled 2022-08-22 (×28): qty 1

## 2022-08-22 MED ORDER — ALUM & MAG HYDROXIDE-SIMETH 200-200-20 MG/5ML PO SUSP: 30.0000 mL | ORAL | Status: DC | PRN

## 2022-08-22 MED ORDER — JUVEN PO PACK: 1.0000 | PACK | Freq: Two times a day (BID) | ORAL | 0 refills | Status: DC

## 2022-08-22 MED ORDER — BISACODYL 5 MG PO TBEC
5.0000 mg | DELAYED_RELEASE_TABLET | Freq: Every day | ORAL | Status: DC | PRN
  Administered 2022-08-29: 5 mg via ORAL
  Filled 2022-08-22: qty 1

## 2022-08-22 MED ORDER — ISOSORBIDE MONONITRATE ER 30 MG PO TB24
15.0000 mg | ORAL_TABLET | Freq: Every day | ORAL | Status: DC
  Administered 2022-08-23 – 2022-09-05 (×13): 15 mg via ORAL
  Filled 2022-08-22 (×13): qty 1

## 2022-08-22 MED ORDER — MELATONIN 3 MG PO TABS
3.0000 mg | ORAL_TABLET | Freq: Every evening | ORAL | Status: DC | PRN
  Administered 2022-09-02 – 2022-09-04 (×2): 3 mg via ORAL
  Filled 2022-08-22 (×3): qty 1

## 2022-08-22 MED ORDER — CARVEDILOL 3.125 MG PO TABS
3.1250 mg | ORAL_TABLET | Freq: Two times a day (BID) | ORAL | Status: DC
  Administered 2022-08-23 – 2022-09-05 (×27): 3.125 mg via ORAL
  Filled 2022-08-22 (×27): qty 1

## 2022-08-22 MED ORDER — GABAPENTIN 300 MG PO CAPS
300.0000 mg | ORAL_CAPSULE | Freq: Three times a day (TID) | ORAL | Status: DC
  Administered 2022-08-22 – 2022-08-28 (×17): 300 mg via ORAL
  Filled 2022-08-22 (×17): qty 1

## 2022-08-22 MED ORDER — HYDROCODONE-ACETAMINOPHEN 5-325 MG PO TABS
1.0000 | ORAL_TABLET | ORAL | Status: DC | PRN
  Administered 2022-08-22 – 2022-08-23 (×6): 1 via ORAL
  Administered 2022-08-24: 2 via ORAL
  Filled 2022-08-22 (×8): qty 1

## 2022-08-22 MED ORDER — NEPRO/CARBSTEADY PO LIQD: 237.0000 mL | Freq: Two times a day (BID) | ORAL | 0 refills | Status: DC

## 2022-08-22 MED ORDER — HYDROCODONE-ACETAMINOPHEN 5-325 MG PO TABS: 1.0000 | ORAL_TABLET | Freq: Four times a day (QID) | ORAL | 0 refills | Status: DC | PRN

## 2022-08-22 MED ORDER — ASCORBIC ACID 1000 MG PO TABS: 1000.0000 mg | ORAL_TABLET | Freq: Every day | ORAL | Status: AC

## 2022-08-22 MED ORDER — PROCHLORPERAZINE EDISYLATE 10 MG/2ML IJ SOLN: 5.0000 mg | Freq: Four times a day (QID) | INTRAMUSCULAR | Status: DC | PRN

## 2022-08-22 MED ORDER — ZINC SULFATE 220 (50 ZN) MG PO CAPS: 220.0000 mg | ORAL_CAPSULE | Freq: Every day | ORAL | Status: AC

## 2022-08-22 MED ORDER — ZINC SULFATE 220 (50 ZN) MG PO CAPS
220.0000 mg | ORAL_CAPSULE | Freq: Every day | ORAL | Status: AC
  Administered 2022-08-23 – 2022-09-01 (×10): 220 mg via ORAL
  Filled 2022-08-22 (×10): qty 1

## 2022-08-22 MED ORDER — GUAIFENESIN-DM 100-10 MG/5ML PO SYRP: 5.0000 mL | ORAL_SOLUTION | Freq: Four times a day (QID) | ORAL | Status: DC | PRN

## 2022-08-22 MED ORDER — JUVEN PO PACK
1.0000 | PACK | Freq: Two times a day (BID) | ORAL | Status: DC
  Administered 2022-08-23 – 2022-09-05 (×19): 1 via ORAL
  Filled 2022-08-22 (×17): qty 1

## 2022-08-22 MED ORDER — DOCUSATE SODIUM 100 MG PO CAPS
100.0000 mg | ORAL_CAPSULE | Freq: Every day | ORAL | Status: DC
  Administered 2022-08-23 – 2022-09-05 (×14): 100 mg via ORAL
  Filled 2022-08-22 (×14): qty 1

## 2022-08-22 MED ORDER — DIPHENHYDRAMINE HCL 25 MG PO CAPS: 25.0000 mg | ORAL_CAPSULE | Freq: Four times a day (QID) | ORAL | Status: DC | PRN

## 2022-08-22 MED ORDER — NALOXONE HCL 0.4 MG/ML IJ SOLN: 0.4000 mg | INTRAMUSCULAR | Status: DC | PRN

## 2022-08-22 NOTE — Progress Notes (Addendum)
Inpatient Rehab Admissions Coordinator:  Spoke with pt's daugther Cloretta Ned. Discussed CIR goals and expectations. She acknowledged understanding. She is supportive of pt pursuing CIR. She confirmed that family will be able to provide 24/7 support for pt after discharge.  Saw pt at bedside. He informed AC that he is agreeable to going to [facility] after rehab. Informed pt that will begin insurance authorization. Will continue to follow.  1423: Insurance authorization received. Await bed availability.    Wolfgang Phoenix, MS, CCC-SLP Admissions Coordinator [phone]

## 2022-08-22 NOTE — Discharge Instructions (Signed)
Information on my medicine - ELIQUIS (apixaban)  This medication education was reviewed with me or my healthcare representative as part of my discharge preparation.  The pharmacist that spoke with me during my hospital stay was:    Why was Eliquis prescribed for you? Eliquis was prescribed to treat blood clots that may have been found in the veins of your legs (deep vein thrombosis) or in your lungs (pulmonary embolism) and to reduce the risk of them occurring again.  What do You need to know about Eliquis ? Continue Eliquist 5 mg tablet taken TWICE daily.  Eliquis may be taken with or without food.   Try to take the dose about the same time in the morning and in the evening. If you have difficulty swallowing the tablet whole please discuss with your pharmacist how to take the medication safely.  Take Eliquis exactly as prescribed and DO NOT stop taking Eliquis without talking to the doctor who prescribed the medication.  Stopping may increase your risk of developing a new blood clot.  Refill your prescription before you run out.  After discharge, you should have regular check-up appointments with your healthcare provider that is prescribing your Eliquis.    What do you do if you miss a dose? If a dose of ELIQUIS is not taken at the scheduled time, take it as soon as possible on the same day and twice-daily administration should be resumed. The dose should not be doubled to make up for a missed dose.  Important Safety Information A possible side effect of Eliquis is bleeding. You should call your healthcare provider right away if you experience any of the following: Bleeding from an injury or your nose that does not stop. Unusual colored urine (red or dark brown) or unusual colored stools (red or black). Unusual bruising for unknown reasons. A serious fall or if you hit your head (even if there is no bleeding).  Some medicines may interact with Eliquis and might increase your  risk of bleeding or clotting while on Eliquis. To help avoid this, consult your healthcare provider or pharmacist prior to using any new prescription or non-prescription medications, including herbals, vitamins, non-steroidal anti-inflammatory drugs (NSAIDs) and supplements.  This website has more information on Eliquis (apixaban): http://www.eliquis.com/eliquis/home  

## 2022-08-22 NOTE — Plan of Care (Signed)
  Problem: Education: Goal: Knowledge of the prescribed therapeutic regimen will improve Outcome: Progressing Goal: Ability to verbalize activity precautions or restrictions will improve Outcome: Progressing Goal: Understanding of discharge needs will improve Outcome: Progressing   Problem: Activity: Goal: Ability to perform//tolerate increased activity and mobilize with assistive devices will improve Outcome: Progressing   Problem: Clinical Measurements: Goal: Postoperative complications will be avoided or minimized Outcome: Progressing   Problem: Self-Care: Goal: Ability to meet self-care needs will improve Outcome: Progressing   Problem: Self-Concept: Goal: Ability to maintain and perform role responsibilities to the fullest extent possible will improve Outcome: Progressing   Problem: Pain Management: Goal: Pain level will decrease with appropriate interventions Outcome: Progressing   Problem: Skin Integrity: Goal: Demonstration of wound healing without infection will improve Outcome: Progressing   Problem: Education: Goal: Knowledge of General Education information will improve Description: Including pain rating scale, medication(s)/side effects and non-pharmacologic comfort measures Outcome: Progressing   Problem: Health Behavior/Discharge Planning: Goal: Ability to manage health-related needs will improve Outcome: Progressing   Problem: Clinical Measurements: Goal: Ability to maintain clinical measurements within normal limits will improve Outcome: Progressing Goal: Will remain free from infection Outcome: Progressing Goal: Diagnostic test results will improve Outcome: Progressing Goal: Respiratory complications will improve Outcome: Progressing Goal: Cardiovascular complication will be avoided Outcome: Progressing   Problem: Activity: Goal: Risk for activity intolerance will decrease Outcome: Progressing   Problem: Nutrition: Goal: Adequate nutrition  will be maintained Outcome: Progressing   Problem: Coping: Goal: Level of anxiety will decrease Outcome: Progressing   Problem: Elimination: Goal: Will not experience complications related to bowel motility Outcome: Progressing Goal: Will not experience complications related to urinary retention Outcome: Progressing   Problem: Pain Managment: Goal: General experience of comfort will improve Outcome: Progressing   Problem: Safety: Goal: Ability to remain free from injury will improve Outcome: Progressing   Problem: Skin Integrity: Goal: Risk for impaired skin integrity will decrease Outcome: Progressing   Problem: Education: Goal: Ability to describe self-care measures that may prevent or decrease complications (Diabetes Survival Skills Education) will improve Outcome: Progressing Goal: Individualized Educational Video(s) Outcome: Progressing   Problem: Coping: Goal: Ability to adjust to condition or change in health will improve Outcome: Progressing   Problem: Fluid Volume: Goal: Ability to maintain a balanced intake and output will improve Outcome: Progressing   Problem: Health Behavior/Discharge Planning: Goal: Ability to identify and utilize available resources and services will improve Outcome: Progressing Goal: Ability to manage health-related needs will improve Outcome: Progressing   Problem: Metabolic: Goal: Ability to maintain appropriate glucose levels will improve Outcome: Progressing   Problem: Nutritional: Goal: Maintenance of adequate nutrition will improve Outcome: Progressing Goal: Progress toward achieving an optimal weight will improve Outcome: Progressing   Problem: Skin Integrity: Goal: Risk for impaired skin integrity will decrease Outcome: Progressing   Problem: Tissue Perfusion: Goal: Adequacy of tissue perfusion will improve Outcome: Progressing   

## 2022-08-22 NOTE — Discharge Summary (Signed)
Physician Discharge Summary   Patient: Caleb [name] MRN: [id number] DOB: [birth year]  Admit date:     08/18/2022  Discharge date: 08/22/22  Discharge Physician: Alberteen Sam   PCP: Hoy Register, MD     Recommendations at discharge:  Follow up with Dr. [facility] for RIGHT BKA after discharge Check Hgb in 1 week (discharge Hgb 8.5 g/dL) Check iron stores in 6 weeks Check basic metabolic panel in 3 days, to monitor sodium, creatinine, resume Lasix when appropriate     Discharge Diagnoses: Principal Problem:   Osteomyelitis of right lower extremity (HCC) Active Problems:   AKI (acute kidney injury) (HCC)   Chronic hyponatremia   Chronic systolic CHF (congestive heart failure) (HCC)   Peripheral vascular disease (HCC)   Hyperlipidemia   Essential hypertension   DM2 (diabetes mellitus, type 2) (HCC)   Stage IIIa chronic kidney disease   S/P TAVR (transcatheter aortic valve replacement)   Anemia of chronic disease   Coronary artery disease involving native coronary artery of native heart with angina pectoris [facility])   Transaminitis      Hospital Course: Mr. Crute is a 72 y.o. M with DM, HTN, HLD, PVD (bilateral TCAR, bilateral popliteal artery stents, right fem-tib bypass, and hx amputation of L 4th and 5th toes as well as right partial calcaneus excision), sCHF EF 20%, AV stenosis s/p TAVR 10/2021 who was admitted twice for failure to heal his right partial calcaneus excision wound, and was planned for salvage BKA on 5/31 but presented one day early due to untreated pain.       * Osteomyelitis of right lower extremity (HCC) Status post right below-knee amputation and application of Kerecis micrograft and Prevena and stump shrinker on 5/31 by Dr. Lajoyce Corners Patient was admitted and orthopedics were consulted.  He was taken on hospital day 2 for right BKA.  His postoperative course was uncomplicated.  He should follow-up with Dr. Lajoyce Corners for wound VAC removal  and proceeding with prosthesis as directed.    AKI (acute kidney injury) (HCC) Cr 2.1 on admission, baseline is 1.5 Treated with fluids, creatinine resolved to baseline. Hold furosemide few more days, resume in 3 to 7 days.   Chronic hyponatremia Sodium resolved to baseline by discharge.  Chronic systolic CHF (congestive heart failure) (HCC) Appears euvolemic.  Furosemide held at discharge, should be restarted soon.  Peripheral vascular disease (HCC) Eliquis hold for surgery, resumed yesterday, Hgb stable.  Transaminitis Slight transaminitis postoperatively, resolved spontaneously.  Coronary artery disease involving native coronary artery of native heart with angina pectoris (HCC)  Anemia of chronic disease Hgb down to 7.9 transfused pre-op --> 9.2 > 8.1 > 8.6 > 8.5 today  Hypoproliferative and iron sat 6% 1 week ago, started on IV iron here.  Discharged back on oral iron, recommend CBC in 1 week and iron studies in 6 weeks.  S/P TAVR (transcatheter aortic valve replacement)   Stage IIIa chronic kidney disease Baseline 1.5-1.6  DM2 (diabetes mellitus, type 2) (HCC)  Essential hypertension Hydralazine and Lasix held at discharge due to soft blood pressure.  Hyperlipidemia On Lipitor             Consultants: Orthopedic surgery  Procedures performed: Right below-knee amputation  Disposition:  Inpatient rehab Diet recommendation:  Cardiac and Carb modified diet  DISCHARGE MEDICATION: Allergies as of 08/22/2022       Reactions   Codeine Nausea And Vomiting   Percocet [oxycodone-acetaminophen] Nausea And Vomiting   Tramadol Hcl Nausea And Vomiting  Medication List     STOP taking these medications    acetaminophen 500 MG tablet Commonly known as: TYLENOL   amoxicillin-clavulanate 500-125 MG tablet Commonly known as: AUGMENTIN   furosemide 80 MG tablet Commonly known as: LASIX   hydrALAZINE 10 MG tablet Commonly known as: APRESOLINE        TAKE these medications    alum & mag hydroxide-simeth 200-200-20 MG/5ML suspension Commonly known as: MAALOX/MYLANTA Take 15-30 mLs by mouth every 2 (two) hours as needed for indigestion.   ascorbic acid 1000 MG tablet Commonly known as: VITAMIN C Take 1 tablet (1,000 mg total) by mouth daily. Start taking on: August 23, 2022   aspirin EC 81 MG tablet Take 81 mg by mouth in the morning.   atorvastatin 40 MG tablet Commonly known as: LIPITOR Take 1 tablet (40 mg total) by mouth in the morning.   bisacodyl 5 MG EC tablet Commonly known as: DULCOLAX Take 1 tablet (5 mg total) by mouth daily as needed for moderate constipation.   carvedilol 3.125 MG tablet Commonly known as: COREG Take 1 tablet (3.125 mg total) by mouth 2 (two) times daily with a meal.   cyanocobalamin 500 MCG tablet Commonly known as: VITAMIN B12 Take 500 mcg by mouth in the morning.   docusate sodium 100 MG capsule Commonly known as: COLACE Take 1 capsule (100 mg total) by mouth daily. Start taking on: August 23, 2022   Eliquis 5 MG Tabs tablet Generic drug: apixaban Take 1 tablet (5 mg total) by mouth 2 (two) times daily.   feeding supplement (NEPRO CARB STEADY) Liqd Take 237 mLs by mouth 2 (two) times daily between meals. Start taking on: August 23, 2022   nutrition supplement (JUVEN) Pack Take 1 packet by mouth 2 (two) times daily between meals. Start taking on: August 23, 2022   ferrous sulfate 325 (65 FE) MG EC tablet Take 1 tablet (325 mg total) by mouth 2 (two) times daily. What changed: You were already taking a medication with the same name, and this prescription was added. Make sure you understand how and when to take each.   ferrous sulfate 325 (65 FE) MG EC tablet Take 1 tablet (325 mg total) by mouth 2 (two) times daily. What changed: You were already taking a medication with the same name, and this prescription was added. Make sure you understand how and when to take each.   ferrous  sulfate 325 (65 FE) MG tablet Take 1 tablet (325 mg total) by mouth every other day. What changed: when to take this   FreeStyle Libre 14 Day Sensor Misc Inject 1 Device into the skin every 14 (fourteen) days.   gabapentin 300 MG capsule Commonly known as: NEURONTIN Take 1 capsule (300 mg total) by mouth 3 (three) times daily.   HYDROcodone-acetaminophen 5-325 MG tablet Commonly known as: NORCO/VICODIN Take 1-2 tablets by mouth every 6 (six) hours as needed for moderate pain.   insulin NPH-regular Human (70-30) 100 UNIT/ML injection Inject 7-8 Units into the skin See admin instructions. Inject 8 units into the skin before breakfast and 7 units before the evening meal   isosorbide mononitrate 30 MG 24 hr tablet Commonly known as: IMDUR Take 0.5 tablets (15 mg total) by mouth daily.   ondansetron 4 MG tablet Commonly known as: Zofran Take 1 tablet (4 mg total) by mouth every 8 (eight) hours as needed for nausea or vomiting.   polyethylene glycol 17 g packet Commonly known as: MIRALAX /  GLYCOLAX Take 17 g by mouth daily. What changed:  when to take this reasons to take this   PRESCRIPTION MEDICATION Inject 0.1-1 mLs as directed See admin instructions. Tri-Mix Standard Strength 5 ml. Formula: Prostaglandin 10 mcg/ml, Papaverine 30 mg/ml, Phentolamine 1 mg/ml. Inject 0.1 ml into side of penis as directed as needed (to be injected immediately before sexual intercourse) may increase the dose by 0.1 ml every 48 hours to achieve an erection. Max dose 1 ml.   Vitamin D-3 25 MCG (1000 UT) Caps Take 1,000 Units by mouth daily.   zinc sulfate 220 (50 Zn) MG capsule Take 1 capsule (220 mg total) by mouth daily. Start taking on: August 23, 2022               Discharge Care Instructions  (From admission, onward)           Start     Ordered   08/22/22 0000  Discharge wound care:       Comments: Leave wound vac in place until follow up with Dr. Lajoyce Corners   08/22/22 1552             Follow-up Information     Nadara Mustard, MD Follow up in 1 week(s).   Specialty: Orthopedic Surgery Contact information: [address] [place] [address] [phone]                 Discharge Instructions     Discharge wound care:   Complete by: As directed    Leave wound vac in place until follow up with Dr. Lajoyce Corners   Increase activity slowly   Complete by: As directed    Negative Pressure Wound Therapy - Incisional   Complete by: As directed        Discharge Exam: Filed Weights   08/20/22 0500 08/21/22 0600 08/22/22 0610  Weight: 79.2 kg 81.3 kg 82.3 kg    General: Pt is alert, awake, not in acute distress Cardiovascular: RRR, nl S1-S2, no murmurs appreciated.   No LE edema.   Respiratory: Normal respiratory rate and rhythm.  CTAB without rales or wheezes. Abdominal: Abdomen soft and non-tender.  No distension or HSM.   Neuro/Psych: Strength symmetric in upper and lower extremities.  Judgment and insight appear normal.   Condition at discharge: good  The results of significant diagnostics from this hospitalization (including imaging, microbiology, ancillary and laboratory) are listed below for reference.   Imaging Studies: DG Foot Complete Right  Result Date: 08/18/2022 CLINICAL DATA:  Right foot pain. EXAM: RIGHT FOOT COMPLETE - 3+ VIEW COMPARISON:  June 07, 2022. FINDINGS: Status post amputation of posterior calcaneus. There is some irregularity involving the residual portion of the anterior calcaneus which may be postsurgical in etiology, but osteomyelitis cannot be excluded. Vascular calcifications are noted. No other fracture or dislocation is noted. IMPRESSION: Status post surgical amputation of posterior calcaneus. Irregularity is seen involving residual portion of anterior calcaneus which may be postsurgical in etiology, but osteomyelitis cannot be excluded. Electronically Signed   By: Lupita Raider M.D.   On: 08/18/2022 16:05   DG Chest  Port 1 View  Result Date: 08/15/2022 CLINICAL DATA:  72 year old male with history of shortness of breath. EXAM: PORTABLE CHEST 1 VIEW COMPARISON:  Chest x-ray 08/02/2022. FINDINGS: Lung volumes are normal. No consolidative airspace disease. No pleural effusions. No pneumothorax. No pulmonary nodule or mass noted. Pulmonary vasculature and the cardiomediastinal silhouette are within normal limits. Status post TAVR. IMPRESSION: 1.  No  radiographic evidence of acute cardiopulmonary disease. 2. Status post TAVR. Electronically Signed   By: Trudie Reed M.D.   On: 08/15/2022 06:47   DG Abd Portable 1V  Result Date: 08/11/2022 CLINICAL DATA:  Vomiting EXAM: PORTABLE ABDOMEN - 1 VIEW COMPARISON:  CT abdomen and pelvis 08/03/2022 FINDINGS: The bowel gas pattern is normal. No radio-opaque calculi or other significant radiographic abnormality are seen. IMPRESSION: Negative. Electronically Signed   By: Minerva Fester M.D.   On: 08/11/2022 18:44   CT ABDOMEN PELVIS W CONTRAST  Result Date: 08/03/2022 CLINICAL DATA:  Abdominal pain. EXAM: CT ABDOMEN AND PELVIS WITH CONTRAST TECHNIQUE: Multidetector CT imaging of the abdomen and pelvis was performed using the standard protocol following bolus administration of intravenous contrast. RADIATION DOSE REDUCTION: This exam was performed according to the departmental dose-optimization program which includes automated exposure control, adjustment of the mA and/or kV according to patient size and/or use of iterative reconstruction technique. CONTRAST:  75mL OMNIPAQUE IOHEXOL 350 MG/ML SOLN COMPARISON:  May 27, 2020 FINDINGS: Lower chest: An artificial aortic valve is seen. Hepatobiliary: No focal liver abnormality is seen. No gallstones, gallbladder wall thickening, or biliary dilatation. Pancreas: Unremarkable. No pancreatic ductal dilatation or surrounding inflammatory changes. Spleen: Normal in size without focal abnormality. Adrenals/Urinary Tract: Adrenal glands are  unremarkable. Kidneys are normal, without renal calculi, focal lesion, or hydronephrosis. Bladder is unremarkable. Stomach/Bowel: The stomach is poorly distended. Mild thickening of the gastric antrum is noted. The appendix appears normal. Stool is seen throughout the large bowel. No evidence of bowel wall thickening, distention, or inflammatory changes. Vascular/Lymphatic: Aortic atherosclerosis. No enlarged abdominal or pelvic lymph nodes. Reproductive: The prostate gland is mildly enlarged. Other: No abdominal wall hernia or abnormality. No abdominopelvic ascites. Musculoskeletal: Stable, increased trabeculation is seen within the pelvis, sacrum and coccyx. No acute osseous abnormalities are identified. IMPRESSION: 1. Mild thickening of the gastric antrum which may be, in part, related to poor gastric distension. Mild gastritis cannot be excluded. 2. Stable, increased trabeculation within the pelvis, sacrum and coccyx, likely representing Paget's disease. 3. Aortic atherosclerosis. 4. Prostatomegaly. Aortic Atherosclerosis (ICD10-I70.0). Electronically Signed   By: Aram Candela M.D.   On: 08/03/2022 03:14   CT Head Wo Contrast  Result Date: 08/03/2022 CLINICAL DATA:  Altered mental status. EXAM: CT HEAD WITHOUT CONTRAST TECHNIQUE: Contiguous axial images were obtained from the base of the skull through the vertex without intravenous contrast. RADIATION DOSE REDUCTION: This exam was performed according to the departmental dose-optimization program which includes automated exposure control, adjustment of the mA and/or kV according to patient size and/or use of iterative reconstruction technique. COMPARISON:  November 13, 2019 FINDINGS: Brain: There is mild cerebral atrophy with widening of the extra-axial spaces and ventricular dilatation. There are areas of decreased attenuation within the white matter tracts of the supratentorial brain, consistent with microvascular disease changes. Vascular: No hyperdense  vessel or unexpected calcification. Skull: Normal. Negative for fracture or focal lesion. Sinuses/Orbits: No acute finding. Other: None. IMPRESSION: 1. No acute intracranial abnormality. 2. Generalized cerebral atrophy and microvascular disease changes of the supratentorial brain. Electronically Signed   By: Aram Candela M.D.   On: 08/03/2022 03:09   DG ABD ACUTE 2+V W 1V CHEST  Result Date: 08/03/2022 CLINICAL DATA:  Vomiting EXAM: DG ABDOMEN ACUTE WITH 1 VIEW CHEST COMPARISON:  Chest x-ray 05/27/2022 FINDINGS: The heart is enlarged, patient is status post TAVR. The lungs and costophrenic angles are clear. There is no pneumothorax. Bowel-gas pattern is nonobstructive. There is  average stool burden. Multiple wires overlie the abdomen. There are no suspicious calcifications. No acute fractures are seen. IMPRESSION: 1. Cardiomegaly without edema or consolidation. 2. Nonobstructive bowel gas pattern. Average stool burden. Electronically Signed   By: Darliss Cheney M.D.   On: 08/03/2022 00:01    Microbiology: Results for orders placed or performed during the hospital encounter of 08/18/22  Surgical pcr screen     Status: None   Collection Time: 08/19/22  4:41 AM   Specimen: Nasal Mucosa; Nasal Swab  Result Value Ref Range Status   MRSA, PCR NEGATIVE NEGATIVE Final   Staphylococcus aureus NEGATIVE NEGATIVE Final    Comment: (NOTE) The Xpert SA Assay (FDA approved for NASAL specimens in patients 59 years of age and older), is one component of a comprehensive surveillance program. It is not intended to diagnose infection nor to guide or monitor treatment. Performed at [facility], [address] [place], [place] [address]     Labs: CBC: Recent Labs  Lab 08/18/22 1751 08/19/22 0601 08/20/22 0317 08/21/22 0434 08/22/22 0843  WBC 12.1* 11.0* 7.9 11.0* 10.6*  NEUTROABS 9.0* 7.8*  --   --   --   HGB 7.9* 9.2* 8.1* 8.6* 8.5*  HCT 24.9* 29.1* 26.4* 27.6* 26.3*  MCV 85.0 83.9 83.5  83.1 83.0  PLT 516* 552* 487* 515* 557*   Basic Metabolic Panel: Recent Labs  Lab 08/18/22 1751 08/19/22 0601 08/20/22 0317 08/21/22 0434  NA 129* 131* 127* 130*  K 4.5 4.5 4.4 4.6  CL 92* 94* 94* 98  CO2 25 25 23 23   GLUCOSE 147* 154* 151* 137*  BUN 39* 33* 28* 31*  CREATININE 2.15* 1.94* 1.54* 1.28*  CALCIUM 8.2* 8.9 8.2* 8.6*  MG  --  2.4  --   --    Liver Function Tests: Recent Labs  Lab 08/18/22 1751 08/19/22 0601 08/20/22 0317 08/21/22 0434  AST 32 36 80* 50*  ALT 30 33 48* 41  ALKPHOS 110 128* 110 114  BILITOT 0.5 0.4 0.4 0.4  PROT 7.5 8.2* 7.1 7.6  ALBUMIN 2.1* 2.4* 1.9* 2.2*   CBG: Recent Labs  Lab 08/21/22 1157 08/21/22 1539 08/21/22 2151 08/22/22 0716 08/22/22 1151  GLUCAP 110* 168* 203* 133* 140*    Discharge time spent: approximately 35 minutes spent on discharge counseling, evaluation of patient on day of discharge, and coordination of discharge planning with nursing, social work, pharmacy and case management  Signed: Alberteen Sam, MD [facility] 08/22/2022

## 2022-08-22 NOTE — Progress Notes (Signed)
Signed     PMR Admission Coordinator Pre-Admission Assessment   Patient: Caleb [name] is an 72 y.o., male MRN: [id number] DOB: Oct 11, 1950 Height: 5\' 7"  (170.2 cm) Weight: 82.3 kg   Insurance Information HMO:     PPO:      PCP:      IPA:      80/20:      OTHER:  PRIMARY: [facility]     Policy#: [id number]      Subscriber: patient CM Name: Sherrilyn Rist      Phone#: [phone]     Fax#: [phone] Pre-Cert#: [id number]    Received authorization from [employer] on 08/22/22  Employer:  Benefits:  Phone #: [phone]     Name:  Eff. Date: 12/22/19     Deduct: NA      Out of Pocket Max: NA      Life Max: NA CIR: 100% coverage      SNF:  Outpatient:      Co-Pay:  Home Health:       Co-Pay:  DME:      Co-Pay:  Providers: in-network SECONDARY: [employer]       Policy#: [id number]     Phone#: [phone]   Financial Counselor:       Phone#:    The "Data Collection Information Summary" for patients in Inpatient Rehabilitation Facilities with attached "Privacy Act Statement-Health Care Records" was provided and verbally reviewed with: N/A   Emergency Contact Information Contact Information       Name Relation Home Work Mobile    [place] Cousin     [phone]    Samay, Burritt Daughter     [phone]    Avinash, Griffee Daughter     [phone]    Loukas, Cruzen Daughter     [phone]           Current Medical History  Patient Admitting Diagnosis: osteomyelitis of RLE, s/p R BKA History of Present Illness: Pt is a 72 year old male with medical hx significant for: CHF, hyperlipidemia, HTN, GERD, DM II, carotid artery occlusion, hyponatremia, neuropathy, s/p TAVR (10/2021), h/o amputation of left 4th and 5th toes,  PVD. Pt presented to [facility] on 08/18/22 d/t worsening right foot pain.Pt has chronic right foot osteomyelitis with multiple revisions. Pt scheduled for right foot amputation on 08/19/22. X-ray negative for acute interval change. Pt's recent hospitalization at  [facility] included AKI. Orthopedics consulted. Pt underwent right BKA and application of wound VAC by Dr. Lajoyce Corners on 08/19/22. Therapy evaluations completed and CIR recommended d/t pt's deficits in functional mobility.   Patient's medical record from [facility] has been reviewed by the rehabilitation admission coordinator and physician.   Past Medical History      Past Medical History:  Diagnosis Date   Carotid artery occlusion     CHF (congestive heart failure) (HCC)     Diabetes mellitus without complication (HCC)      Type II   Dyslipidemia 09/27/2012   Erectile dysfunction 09/27/2012   GERD (gastroesophageal reflux disease)     Hypercholesteremia     Hyperlipidemia 08/12/2012   Hypertension     Hyponatremia 08/14/2012   MGUS (monoclonal gammopathy of unknown significance)     Neuropathy     Pancreatitis     Pancreatitis, acute 09/27/2012   Peripheral vascular disease (HCC)     S/P TAVR (transcatheter aortic valve replacement) 01/11/2022    s/p TAVR with a 26 mm Edwards S3UR via the TF approach by Dr.  [facility] & Bartle   Severe aortic stenosis     Vitamin D deficiency 06/23/2015      Has the patient had major surgery during 100 days prior to admission? Yes   Family History   family history includes Alcohol abuse in his father; CAD in his father; CAD (age of onset: 73) in his brother; Colon polyps in his mother; Diabetes in his mother; Hypertension in his father.   Current Medications   Current Facility-Administered Medications:    0.9 %  sodium chloride infusion (Manually program via Guardrails IV Fluids), , Intravenous, Once, Nadara Mustard, MD   alum & mag hydroxide-simeth (MAALOX/MYLANTA) 200-200-20 MG/5ML suspension 15-30 mL, 15-30 mL, Oral, Q2H PRN, Nadara Mustard, MD   apixaban Everlene Balls) tablet 5 mg, 5 mg, Oral, BID, Danford, Earl Lites, MD, 5 mg at 08/22/22 1610   ascorbic acid (VITAMIN C) tablet 1,000 mg, 1,000 mg, Oral, Daily, Nadara Mustard, MD, 1,000 mg  at 08/22/22 0915   aspirin EC tablet 81 mg, 81 mg, Oral, q AM, Nadara Mustard, MD, 81 mg at 08/22/22 0806   atorvastatin (LIPITOR) tablet 40 mg, 40 mg, Oral, QHS, Nadara Mustard, MD, 40 mg at 08/21/22 2102   bisacodyl (DULCOLAX) EC tablet 5 mg, 5 mg, Oral, Daily PRN, Nadara Mustard, MD   carvedilol (COREG) tablet 3.125 mg, 3.125 mg, Oral, BID WC, Danford, Earl Lites, MD, 3.125 mg at 08/22/22 9604   docusate sodium (COLACE) capsule 100 mg, 100 mg, Oral, Daily, Nadara Mustard, MD, 100 mg at 08/22/22 0915   feeding supplement (NEPRO CARB STEADY) liquid 237 mL, 237 mL, Oral, BID BM, Danford, Earl Lites, MD, 237 mL at 08/22/22 1157   ferric gluconate (FERRLECIT) 125 mg in sodium chloride 0.9 % 100 mL IVPB, 125 mg, Intravenous, Daily, Danford, Earl Lites, MD, Last Rate: 110 mL/hr at 08/22/22 0920, 125 mg at 08/22/22 0920   gabapentin (NEURONTIN) capsule 300 mg, 300 mg, Oral, TID, Nadara Mustard, MD, 300 mg at 08/22/22 0915   guaiFENesin-dextromethorphan (ROBITUSSIN DM) 100-10 MG/5ML syrup 15 mL, 15 mL, Oral, Q4H PRN, Nadara Mustard, MD   hydrALAZINE (APRESOLINE) injection 5 mg, 5 mg, Intravenous, Q20 Min PRN, Nadara Mustard, MD   HYDROcodone-acetaminophen (NORCO/VICODIN) 5-325 MG per tablet 1 tablet, 1 tablet, Oral, Q6H PRN, Alberteen Sam, MD, 1 tablet at 08/20/22 2041   HYDROmorphone (DILAUDID) injection 1 mg, 1 mg, Intravenous, Q4H PRN, Danford, Earl Lites, MD, 1 mg at 08/22/22 0806   insulin aspart (novoLOG) injection 0-5 Units, 0-5 Units, Subcutaneous, QHS, Nadara Mustard, MD, 2 Units at 08/21/22 2211   insulin aspart (novoLOG) injection 0-9 Units, 0-9 Units, Subcutaneous, TID WC, Nadara Mustard, MD, 1 Units at 08/22/22 1156   isosorbide mononitrate (IMDUR) 24 hr tablet 15 mg, 15 mg, Oral, Daily, Danford, Earl Lites, MD, 15 mg at 08/22/22 0915   labetalol (NORMODYNE) injection 10 mg, 10 mg, Intravenous, Q10 min PRN, Nadara Mustard, MD   magnesium citrate solution 1 Bottle, 1  Bottle, Oral, Once PRN, Nadara Mustard, MD   magnesium sulfate IVPB 2 g 50 mL, 2 g, Intravenous, Daily PRN, Nadara Mustard, MD   melatonin tablet 3 mg, 3 mg, Oral, QHS PRN, Nadara Mustard, MD, 3 mg at 08/21/22 2102   metoprolol tartrate (LOPRESSOR) injection 2-5 mg, 2-5 mg, Intravenous, Q2H PRN, Nadara Mustard, MD   naloxone [facility]) injection 0.4 mg, 0.4 mg, Intravenous, PRN, Nadara Mustard, MD  nutrition supplement (JUVEN) (JUVEN) powder packet 1 packet, 1 packet, Oral, BID BM, Nadara Mustard, MD, 1 packet at 08/22/22 0915   ondansetron [facility]) injection 4 mg, 4 mg, Intravenous, Q6H PRN, Nadara Mustard, MD, 4 mg at 08/20/22 2041   pantoprazole (PROTONIX) EC tablet 40 mg, 40 mg, Oral, Daily, Nadara Mustard, MD, 40 mg at 08/22/22 0915   phenol (CHLORASEPTIC) mouth spray 1 spray, 1 spray, Mouth/Throat, PRN, Nadara Mustard, MD   polyethylene glycol (MIRALAX / GLYCOLAX) packet 17 g, 17 g, Oral, Daily PRN, Nadara Mustard, MD   potassium chloride SA (KLOR-CON M) CR tablet 20-40 mEq, 20-40 mEq, Oral, Daily PRN, Nadara Mustard, MD   zinc sulfate capsule 220 mg, 220 mg, Oral, Daily, Nadara Mustard, MD, 220 mg at 08/22/22 0915   Patients Current Diet:  Diet Order                  Diet Heart Room service appropriate? Yes; Fluid consistency: Thin  Diet effective now                         Precautions / Restrictions Precautions Precautions: Fall Precaution Comments: fall, R BKA with wound vac; hx of LLE 4th and 5th digit amputation Other Brace: limb protector Restrictions Weight Bearing Restrictions: Yes RLE Weight Bearing: Non weight bearing    Has the patient had 2 or more falls or a fall with injury in the past year? No   Prior Activity Level Community (5-7x/wk): drives, gets out of house often   Prior Functional Level Self Care: Did the patient need help bathing, dressing, using the toilet or eating? Independent   Indoor Mobility: Did the patient need assistance with walking  from room to room (with or without device)? Independent   Stairs: Did the patient need assistance with internal or external stairs (with or without device)? Pt reports he avoids stairs   Functional Cognition: Did the patient need help planning regular tasks such as shopping or remembering to take medications? Needed some help   Patient Information Are you of Hispanic, Latino/a,or Spanish origin?: A. No, not of Hispanic, Latino/a, or Spanish origin What is your race?: B. Black or African American Do you need or want an interpreter to communicate with a doctor or health care staff?: 0. No   Patient's Response To:  Health Literacy and Transportation Is the patient able to respond to health literacy and transportation needs?: Yes Health Literacy - How often do you need to have someone help you when you read instructions, pamphlets, or other written material from your doctor or pharmacy?: Never In the past 12 months, has lack of transportation kept you from medical appointments or from getting medications?: No In the past 12 months, has lack of transportation kept you from meetings, work, or from getting things needed for daily living?: No   Journalist, newspaper / Equipment Home Assistive Devices/Equipment: Wheelchair Home Equipment: Crutches, Medical laboratory scientific officer - single point, Agricultural consultant (2 wheels), Wheelchair - manual   Prior Device Use: Indicate devices/aids used by the patient prior to current illness, exacerbation or injury? Manual wheelchair and Walker   Current Functional Level Cognition   Overall Cognitive Status: Within Functional Limits for tasks assessed Orientation Level: Oriented X4 General Comments: Pleasant and agreeable throughout session    Extremity Assessment (includes Sensation/Coordination)   Upper Extremity Assessment: Overall WFL for tasks assessed (neuropathy bilat hands) RUE Deficits / Details: Decreased dexterity noted  secondary to diabetic neuropathy. To be further  assessed. LUE Deficits / Details: Decreased dexterity noted secondary to diabetic neuropathy. To be further assessed.  Lower Extremity Assessment: Defer to PT evaluation RLE Deficits / Details: S/p BKA. At least anti gravity strength LLE Deficits / Details: Grossly 5/5 strength, hx of 4th and 5th toe amputations     ADLs   Overall ADL's : Needs assistance/impaired Eating/Feeding: Set up, Sitting Grooming: Set up, Sitting Upper Body Bathing: Sitting, Set up Lower Body Bathing: Min guard, Sitting/lateral leans Upper Body Dressing : Set up, Sitting Lower Body Dressing: Min guard, Sitting/lateral leans Lower Body Dressing Details (indicate cue type and reason): Pt donned underwear while sitting/lateral leaning at EOB with min guard A and assist for threading wound vac Toilet Transfer: Min guard (lateral scoot transfer) Toilet Transfer Details (indicate cue type and reason): simulated Toileting- Clothing Manipulation and Hygiene: Min guard, Sitting/lateral lean Functional mobility during ADLs: Min guard General ADL Comments: Pt educated on use of lateral leaning for LB bathing/dressing, pt demonstrated understanding.     Mobility   Overal bed mobility: Needs Assistance Bed Mobility: Supine to Sit Supine to sit: Supervision, HOB elevated Sit to supine: Supervision General bed mobility comments: HOB elevated, use of bed rail     Transfers   Overall transfer level: Needs assistance Equipment used: None Transfers: Bed to chair/wheelchair/BSC Sit to Stand: +2 physical assistance, +2 safety/equipment, Min assist Bed to/from chair/wheelchair/BSC transfer type:: Lateral/scoot transfer Stand pivot transfers: Mod assist, +2 physical assistance, +2 safety/equipment  Lateral/Scoot Transfers: Min guard General transfer comment: Pt completed lateral scoot transfer from EOB>chair with min guard A. Pt reports lateral scooting as his baseline.     Ambulation / Gait / Stairs / Wheelchair Mobility    Ambulation/Gait General Gait Details: unable     Posture / Balance Dynamic Sitting Balance Sitting balance - Comments: sitting EOB Balance Overall balance assessment: Needs assistance Sitting-balance support: No upper extremity supported, Feet supported Sitting balance-Leahy Scale: Good Sitting balance - Comments: sitting EOB Standing balance support: Bilateral upper extremity supported, Reliant on assistive device for balance Standing balance-Leahy Scale: Poor Standing balance comment: reliant on BUE support on RW for balance     Special needs/care consideration Wound Vac leg/right and Diabetic management Novolog 0-5 units at bedtime; Novolog 0-9 units 3x daily at meals    Previous Home Environment (from acute therapy documentation) Living Arrangements: Alone Available Help at Discharge: Family, Personal care attendant Type of Home: Apartment Home Layout: One level Home Access: Level entry Bathroom Shower/Tub: Engineer, manufacturing systems: Standard Bathroom Accessibility: Yes How Accessible: Accessible via walker, Accessible via wheelchair Home Care Services: Yes Type of Home Care Services: Home PT, Home OT, Home RN Home Care Agency (if known): [place] Additional Comments: HH nurse assists with medication management, cousin comes 1x week to bring meals and complete housekeeping. Pt plans on moving in with family in [place] within 5-8 months.   Discharge Living Setting Plans for Discharge Living Setting: House Type of Home at Discharge: House Discharge Home Layout: One level Discharge Home Access: Stairs to enter Entrance Stairs-Rails: None Entrance Stairs-Number of Steps: 4 in front, 2 in back Discharge Bathroom Shower/Tub: Tub/shower unit Discharge Bathroom Toilet: Standard Discharge Bathroom Accessibility: Yes How Accessible: Accessible via walker Does the patient have any problems obtaining your medications?: No   Social/Family/Support Systems Anticipated Caregiver:  [place]e [place] (cousin) and other family Anticipated Caregiver's Contact Information: Mindi Junker: [phone] Caregiver Availability: 24/7 Discharge Plan Discussed with Primary Caregiver: Yes  Is Caregiver In Agreement with Plan?: Yes Does Caregiver/Family have Issues with Lodging/Transportation while Pt is in Rehab?: No   Goals Patient/Family Goal for Rehab: Supervision: PT/OT Expected length of stay: 7-10 days   Decrease burden of Care through IP rehab admission: NA   Possible need for SNF placement upon discharge: Not anticipated   Patient Condition: I have reviewed medical records from [facility], spoken with CM, and patient and family member. I met with patient at the bedside and discussed via phone for inpatient rehabilitation assessment.  Patient will benefit from ongoing PT and OT, can actively participate in 3 hours of therapy a day 5 days of the week, and can make measurable gains during the admission.  Patient will also benefit from the coordinated team approach during an Inpatient Acute Rehabilitation admission.  The patient will receive intensive therapy as well as Rehabilitation physician, nursing, social worker, and care management interventions.  Due to safety, skin/wound care, disease management, medication administration, pain management, and patient education the patient requires 24 hour a day rehabilitation nursing.  The patient is currently Min A with mobility and basic ADLs.  Discharge setting and therapy post discharge at home with home health is anticipated.  Patient has agreed to participate in the Acute Inpatient Rehabilitation Program and will admit today.   Preadmission Screen Completed By:  Domingo Pulse, 08/22/2022 12:22 PM ______________________________________________________________________   Discussed status with Dr. Wynn Banker on 08/22/22  at 3:20 PM and received approval for admission today.   Admission Coordinator:  Domingo Pulse, CCC-SLP,  time 3:20 PM/Date 08/22/22     Assessment/Plan: Diagnosis:  RIght BKA due to osteomyelitis Does the need for close, 24 hr/day Medical supervision in concert with the patient's rehab needs make it unreasonable for this patient to be served in a less intensive setting? Yes Co-Morbidities requiring supervision/potential complications: CHF (EF 25-30%), Aortic stenosis s/p TAVR, Diabetes with severe neuropathy , PVD Due to bladder management, bowel management, safety, skin/wound care, disease management, medication administration, pain management, and patient education, does the patient require 24 hr/day rehab nursing? Yes Does the patient require coordinated care of a physician, rehab nurse, PT, OT, and SLP to address physical and functional deficits in the context of the above medical diagnosis(es)? Yes Addressing deficits in the following areas: balance, endurance, locomotion, strength, transferring, bowel/bladder control, bathing, dressing, feeding, toileting, and psychosocial support Can the patient actively participate in an intensive therapy program of at least 3 hrs of therapy 5 days a week? Yes The potential for patient to make measurable gains while on inpatient rehab is excellent Anticipated functional outcomes upon discharge from inpatient rehab: modified independent and supervision PT, modified independent and supervision OT, n/a SLP Estimated rehab length of stay to reach the above functional goals is: 7-10d Anticipated discharge destination: Home 10. Overall Rehab/Functional Prognosis: good     MD Signature: Erick Colace M.D. [facility] Fellow Am Acad of Phys Med and Rehab Diplomate Am Board of Electrodiagnostic Med Fellow Am Board of Interventional Pain

## 2022-08-22 NOTE — Progress Notes (Signed)
Occupational Therapy Treatment Patient Details Name: Caleb [name] MRN: [id number] DOB: January 12, 1951 Today's Date: 08/22/2022   History of present illness Pt is 72 yo male who presents with gangrene right foot with osteomyelitis s/p foot salvage intervention. Right below knee amputation performed 5/31. PMHx: osteomyelitis of his right foot status post resection, hx amputation of L 4th and 5th toes as well as right partial calcaneus excision, PVD status post stents with chronic wound, DM2, HTN, HLD, systolic CHF EF 25-30, aortic stenosis status post TAVR 2023.   OT comments  Pt is progressing well toward therapy goals and remains motivated to participate in therapy. Pt continues to present with generalized weakness, decreased balance and residual limb pain with pt educated on desensitization techniques for phantom limb pain. Pt verbalized understanding. During session, pt completed LB dressing while sitting/lateral leaning with min A. Pt performed lateral scoot transfer from bed>chair with min guard A. Pt would continue to benefit from acute OT services to progress toward therapy goals and facilitate transfer to intensive inpatient follow up therapy, >3 hours/day after discharge. Current OT POC remains appropriate.     Recommendations for follow up therapy are one component of a multi-disciplinary discharge planning process, led by the attending physician.  Recommendations may be updated based on patient status, additional functional criteria and insurance authorization.    Assistance Recommended at Discharge Frequent or constant Supervision/Assistance  Patient can return home with the following  Two people to help with walking and/or transfers;A little help with bathing/dressing/bathroom;Assistance with cooking/housework;Direct supervision/assist for medications management;Assist for transportation;Help with stairs or ramp for entrance   Equipment Recommendations  Other (comment) (defer)     Recommendations for Other Services      Precautions / Restrictions Precautions Precautions: Fall Precaution Comments: fall, R BKA with wound vac; hx of LLE 4th and 5th digit amputation Required Braces or Orthoses: Other Brace Other Brace: limb protector Restrictions Weight Bearing Restrictions: Yes RLE Weight Bearing: Non weight bearing       Mobility Bed Mobility Overal bed mobility: Needs Assistance Bed Mobility: Supine to Sit     Supine to sit: Supervision, HOB elevated     General bed mobility comments: HOB elevated, use of bed rail    Transfers Overall transfer level: Needs assistance Equipment used: None Transfers: Bed to chair/wheelchair/BSC            Lateral/Scoot Transfers: Min guard General transfer comment: Pt completed lateral scoot transfer from EOB>chair with min guard A. Pt reports lateral scooting as his baseline.     Balance Overall balance assessment: Needs assistance Sitting-balance support: No upper extremity supported, Feet supported Sitting balance-Leahy Scale: Good Sitting balance - Comments: sitting EOB                                   ADL either performed or assessed with clinical judgement   ADL Overall ADL's : Needs assistance/impaired                     Lower Body Dressing: Sitting/lateral leans;Minimal assistance Lower Body Dressing Details (indicate cue type and reason): Pt donned underwear while sitting/lateral leaning at EOB with min A and assist for threading wound vac Toilet Transfer: Min guard (lateral scoot transfer) Toilet Transfer Details (indicate cue type and reason): simulated         Functional mobility during ADLs: Min guard General ADL Comments: Pt educated on use of  lateral leaning for LB bathing/dressing, pt demonstrated understanding.    Extremity/Trunk Assessment Upper Extremity Assessment Upper Extremity Assessment: Overall WFL for tasks assessed (neuropathy bilat hands) RUE  Deficits / Details: Decreased dexterity noted secondary to diabetic neuropathy. To be further assessed. LUE Deficits / Details: Decreased dexterity noted secondary to diabetic neuropathy. To be further assessed.   Lower Extremity Assessment Lower Extremity Assessment: Defer to PT evaluation        Vision   Vision Assessment?: No apparent visual deficits   Perception Perception Perception: Not tested   Praxis Praxis Praxis: Not tested    Cognition Arousal/Alertness: Awake/alert Behavior During Therapy: WFL for tasks assessed/performed Overall Cognitive Status: Within Functional Limits for tasks assessed                                 General Comments: Pleasant and agreeable throughout session        Exercises      Shoulder Instructions       General Comments VSS on RA    Pertinent Vitals/ Pain       Pain Assessment Pain Assessment: Faces Faces Pain Scale: Hurts a little bit Pain Location: R residual limb phantom pain Pain Descriptors / Indicators: Discomfort, Grimacing Pain Intervention(s): Limited activity within patient's tolerance, Monitored during session, Other (comment) (provided education on desensitization techniques for phantom limb pain)  Home Living                                          Prior Functioning/Environment              Frequency  Min 2X/week        Progress Toward Goals  OT Goals(current goals can now be found in the care plan section)  Progress towards OT goals: Progressing toward goals  Acute Rehab OT Goals Patient Stated Goal: to get stronger OT Goal Formulation: With patient Time For Goal Achievement: 09/05/22 Potential to Achieve Goals: Good ADL Goals Pt Will Perform Grooming: sitting;with modified independence Pt Will Perform Lower Body Bathing: with modified independence;sitting/lateral leans Pt Will Perform Lower Body Dressing: with modified independence;sitting/lateral leans Pt  Will Transfer to Toilet: with min guard assist;bedside commode;stand pivot transfer Pt Will Perform Toileting - Clothing Manipulation and hygiene: with modified independence;sitting/lateral leans  Plan Discharge plan remains appropriate;Frequency remains appropriate    Co-evaluation                 AM-PAC OT "6 Clicks" Daily Activity     Outcome Measure   Help from another person eating meals?: A Little Help from another person taking care of personal grooming?: A Little Help from another person toileting, which includes using toliet, bedpan, or urinal?: A Lot Help from another person bathing (including washing, rinsing, drying)?: A Little Help from another person to put on and taking off regular upper body clothing?: A Little Help from another person to put on and taking off regular lower body clothing?: A Little 6 Click Score: 17    End of Session Equipment Utilized During Treatment: Gait belt  OT Visit Diagnosis: Unsteadiness on feet (R26.81);Other abnormalities of gait and mobility (R26.89);History of falling (Z91.81);Pain Pain - Right/Left: Right Pain - part of body: Leg   Activity Tolerance Patient tolerated treatment well   Patient Left in chair;with call bell/phone within reach;with chair  alarm set   Nurse Communication Mobility status        Time: 1610-9604 OT Time Calculation (min): 24 min  Charges: OT General Charges $OT Visit: 1 Visit OT Treatments $Self Care/Home Management : 8-22 mins $Therapeutic Activity: 8-22 mins  Sherley Bounds, OTS Acute Rehabilitation Services Office [phone] Secure Chat Communication Preferred   Sherley Bounds 08/22/2022, 2:01 PM

## 2022-08-22 NOTE — Progress Notes (Signed)
Inpatient Rehab Admissions Coordinator:  There is a bed available in [facility] for pt today. Dr. Maryfrances Bunnell is aware and in agreement. Pt, pt's cousin Mindi Junker, daughter Cloretta Ned, [place], and [facility] aware.   Wolfgang Phoenix, MS, CCC-SLP Admissions Coordinator [phone]

## 2022-08-22 NOTE — Progress Notes (Signed)
Physical Therapy Treatment Patient Details Name: Caleb [name] MRN: [id number] DOB: [birth year] Today's Date: 08/22/2022   History of Present Illness Pt is 72 yo male who presents with gangrene right foot with osteomyelitis s/p foot salvage intervention. Right below knee amputation performed 5/31. PMHx: osteomyelitis of his right foot status post resection, hx amputation of L 4th and 5th toes as well as right partial calcaneus excision, PVD status post stents with chronic wound, DM2, HTN, HLD, systolic CHF EF 25-30, aortic stenosis status post TAVR 2023.    PT Comments    Pt pleasant and motivated to progress mobility, even though pt reporting 10/10 residual limb pain. Pt tolerated mobility progression well today, performing repeated sit<>stands, BKA exercises (quad sets, hip abd/add, hip extension in static standing with UE support). Pt complaining of phantom pain throughout session, as well as incisional pain. Pt remains a great AIR candidate.      Recommendations for follow up therapy are one component of a multi-disciplinary discharge planning process, led by the attending physician.  Recommendations may be updated based on patient status, additional functional criteria and insurance authorization.  Follow Up Recommendations       Assistance Recommended at Discharge Frequent or constant Supervision/Assistance  Patient can return home with the following A little help with walking and/or transfers;A little help with bathing/dressing/bathroom;Assistance with cooking/housework;Assist for transportation;Help with stairs or ramp for entrance   Equipment Recommendations  None recommended by PT    Recommendations for Other Services Rehab consult     Precautions / Restrictions Precautions Precautions: Fall Precaution Comments: fall, R BKA with wound vac Required Braces or Orthoses: Other Brace Other Brace: limb protector Restrictions Weight Bearing Restrictions: Yes RLE Weight Bearing:  Non weight bearing     Mobility  Bed Mobility Overal bed mobility: Needs Assistance         Sit to supine: HOB elevated, Supervision        Transfers Overall transfer level: Needs assistance Equipment used: Rolling walker (2 wheels) Transfers: Sit to/from Stand, Bed to chair/wheelchair/BSC Sit to Stand: Min assist   Step pivot transfers: Min assist, +2 safety/equipment       General transfer comment: assist for power up and steadying once standing. stand x3 from EOB and recliner.    Ambulation/Gait Ambulation/Gait assistance: Min assist Gait Distance (Feet): 5 Feet Assistive device: Rolling walker (2 wheels) Gait Pattern/deviations: Trunk flexed, Step-to pattern Gait velocity: decr     General Gait Details: hop-to pattern, cues for upright posture and taking his time   Stairs             Wheelchair Mobility    Modified Rankin (Stroke Patients Only)       Balance Overall balance assessment: Needs assistance Sitting-balance support: No upper extremity supported, Feet supported Sitting balance-Leahy Scale: Good     Standing balance support: Bilateral upper extremity supported, Reliant on assistive device for balance Standing balance-Leahy Scale: Poor Standing balance comment: reliant on BUE support on RW for balance                            Cognition Arousal/Alertness: Awake/alert Behavior During Therapy: WFL for tasks assessed/performed Overall Cognitive Status: Within Functional Limits for tasks assessed                                 General Comments: Pleasant and agreeable throughout session  Exercises Amputee Exercises Quad Sets: Right, 10 reps, Supine, AAROM Hip ABduction/ADduction: AROM, Right, 10 reps, Supine    General Comments General comments (skin integrity, edema, etc.): VSS on RA      Pertinent Vitals/Pain Pain Assessment Pain Assessment: 0-10 Pain Score: 10-Worst pain ever Pain  Location: R residual limb phantom pain Pain Descriptors / Indicators: Discomfort, Grimacing Pain Intervention(s): Limited activity within patient's tolerance, Monitored during session, Repositioned, RN gave pain meds during session, Other (comment) (showed techniques of rubbing residual limb to combat phantom limb)    Home Living                          Prior Function            PT Goals (current goals can now be found in the care plan section) Acute Rehab PT Goals PT Goal Formulation: With patient Time For Goal Achievement: 09/03/22 Potential to Achieve Goals: Good Progress towards PT goals: Progressing toward goals    Frequency    Min 3X/week      PT Plan Current plan remains appropriate    Co-evaluation              AM-PAC PT "6 Clicks" Mobility   Outcome Measure  Help needed turning from your back to your side while in a flat bed without using bedrails?: A Little Help needed moving from lying on your back to sitting on the side of a flat bed without using bedrails?: A Little Help needed moving to and from a bed to a chair (including a wheelchair)?: A Little Help needed standing up from a chair using your arms (e.g., wheelchair or bedside chair)?: A Little Help needed to walk in hospital room?: A Lot Help needed climbing 3-5 steps with a railing? : Total 6 Click Score: 15    End of Session   Activity Tolerance: Patient tolerated treatment well Patient left: in bed;with call bell/phone within reach;with bed alarm set Nurse Communication: Mobility status;Patient requests pain meds PT Visit Diagnosis: Difficulty in walking, not elsewhere classified (R26.2);Other abnormalities of gait and mobility (R26.89);Pain;Unsteadiness on feet (R26.81) Pain - Right/Left: Right     Time: 1610-9604 PT Time Calculation (min) (ACUTE ONLY): 23 min  Charges:  $Therapeutic Exercise: 8-22 mins $Therapeutic Activity: 8-22 mins                     Marye Round, PT  DPT Acute Rehabilitation Services Secure Chat Preferred  Office [phone]     Caleb [name] 08/22/2022, 2:42 PM

## 2022-08-22 NOTE — H&P (Signed)
Physical Medicine and Rehabilitation Admission H&P     CC: Functional deficits secondary to osteomyelitis, gangrene of right foot s/p right BKA   HPI: Caleb [name] is a 72 year old diabetic male with a history of chronic right foot osteomyelitis and is status post multiple surgical procedures.  He had most recently declined right below the knee amputation, but after reconsideration this was scheduled with Dr. Lajoyce Corners.  The patient presented to the emergency department on 08/18/2022, one day before surgery, complaining of worsening pain.  His past medical history is significant for congestive heart failure, diabetes mellitus, hypercholesterolemia, hyperlipidemia, hypertension, MGUS, peripheral vascular disease, aortic stenosis status post TAVR in 2023. He is status post revascularization of the right lower extremity.  Despite revascularization, he did not have sufficient circulation to heal the gangrenous heel ulcer status post foot salvage intervention.  He was admitted by the hospitalist service and taken to the operating room on 5/31 and underwent right below the knee amputation by Dr. Lajoyce Corners with placement of Prevena wound VAC dressing.  His serum creatinine was elevated to 2.1 and his baseline is 1.5 with history of CKD stage 3a.  Laboratory workup reveals chronic hyponatremia and serum sodium was trended. He was transfused 2 units of PRBCs. Received IV iron. He is tolerating carb modified diet. The patient requires inpatient medicine and rehabilitation evaluations and services for ongoing dysfunction secondary to osteomyelitis, gangrene of right foot s/p right BKA.   PMH also significant for bilateral carotid artery stenosis s/ right left TCAR.     Review of Systems  Constitutional:  Negative for chills and fever.  HENT:  Negative for congestion and sore throat.   Eyes:  Negative for blurred vision and double vision.  Respiratory:  Negative for cough and shortness of breath.   Cardiovascular:   Negative for chest pain and palpitations.  Gastrointestinal:  Negative for nausea and vomiting.  Genitourinary:  Negative for dysuria and urgency.  Musculoskeletal:  Negative for back pain and neck pain.  Neurological:  Negative for dizziness and headaches.  Psychiatric/Behavioral:  Negative for memory loss. The patient is not nervous/anxious and does not have insomnia.         Past Medical History:  Diagnosis Date   Carotid artery occlusion     CHF (congestive heart failure) (HCC)     Diabetes mellitus without complication (HCC)      Type II   Dyslipidemia 09/27/2012   Erectile dysfunction 09/27/2012   GERD (gastroesophageal reflux disease)     Hypercholesteremia     Hyperlipidemia 08/12/2012   Hypertension     Hyponatremia 08/14/2012   MGUS (monoclonal gammopathy of unknown significance)     Neuropathy     Pancreatitis     Pancreatitis, acute 09/27/2012   Peripheral vascular disease (HCC)     S/P TAVR (transcatheter aortic valve replacement) 01/11/2022    s/p TAVR with a 26 mm Edwards S3UR via the TF approach by Dr. Excell Seltzer & Bartle   Severe aortic stenosis     Vitamin D deficiency 06/23/2015         Past Surgical History:  Procedure Laterality Date   ABDOMINAL AORTOGRAM W/LOWER EXTREMITY N/A 08/08/2019    Procedure: ABDOMINAL AORTOGRAM W/LOWER EXTREMITY;  Surgeon: Runell Gess, MD;  [facility]: [facility];  Service: Cardiovascular;  Laterality: N/A;   ABDOMINAL AORTOGRAM W/LOWER EXTREMITY N/A 11/18/2019    Procedure: ABDOMINAL AORTOGRAM W/LOWER EXTREMITY;  Surgeon: Runell Gess, MD;  Location: [facility];  Service:  Cardiovascular;  Laterality: N/A;   ABDOMINAL AORTOGRAM W/LOWER EXTREMITY N/A 06/13/2022    Procedure: ABDOMINAL AORTOGRAM W/LOWER EXTREMITY;  Surgeon: Chuck Hint, MD;  Location: [facility] INVASIVE CV LAB;  Service: Cardiovascular;  Laterality: N/A;   AMPUTATION Right 08/19/2022    Procedure: RIGHT BELOW KNEE AMPUTATION;  Surgeon: Nadara Mustard, MD;  Location: [facility] OR;  Service: Orthopedics;  Laterality: Right;   APPLICATION OF WOUND VAC   06/24/2022    Procedure: APPLICATION OF WOUND VAC;  Surgeon: Nadara Mustard, MD;  Location: [facility] OR;  Service: Orthopedics;;   CARDIAC CATHETERIZATION       COLONOSCOPY   12 years ago    in [place] clinic= normal exam per pt   ENDARTERECTOMY FEMORAL Right 06/17/2022    Procedure: SUPERFICIAL ENDARTERECTOMY OF COMMON FEMORAL ARTERY;  Surgeon: Victorino Sparrow, MD;  Location: [facility] OR;  Service: Vascular;  Laterality: Right;   FEMORAL-TIBIAL BYPASS GRAFT Right 06/17/2022    Procedure: RIGHT FEMORAL- ANTERIOR TIBIAL ARTERY BYPASS;  Surgeon: Victorino Sparrow, MD;  Location: [facility] OR;  Service: Vascular;  Laterality: Right;   I & D EXTREMITY Right 06/24/2022    Procedure: RIGHT PARTIAL CALCANEUS EXCISION;  Surgeon: Nadara Mustard, MD;  Location: [facility] OR;  Service: Orthopedics;  Laterality: Right;   I & D EXTREMITY Right 07/29/2022    Procedure: RIGHT PARTIAL CALCANEUS EXCISION;  Surgeon: Nadara Mustard, MD;  Location: [facility] OR;  Service: Orthopedics;  Laterality: Right;   INTRAOPERATIVE TRANSTHORACIC ECHOCARDIOGRAM N/A 01/11/2022    Procedure: INTRAOPERATIVE TRANSTHORACIC ECHOCARDIOGRAM;  Surgeon: Tonny Bollman, MD;  Location: [facility] INVASIVE CV LAB;  Service: Open Heart Surgery;  Laterality: N/A;   KNEE SURGERY       MULTIPLE EXTRACTIONS WITH ALVEOLOPLASTY N/A 12/09/2021    Procedure: MULTIPLE EXTRACTION;  Surgeon: Sharman Cheek, DMD;  Location: [facility] OR;  Service: Dentistry;  Laterality: N/A;   PERIPHERAL VASCULAR INTERVENTION Right 08/08/2019    Procedure: PERIPHERAL VASCULAR INTERVENTION;  Surgeon: Runell Gess, MD;  [facility]: [facility];  Service: Cardiovascular;  Laterality: Right;   PERIPHERAL VASCULAR INTERVENTION Left 11/18/2019    Procedure: PERIPHERAL VASCULAR INTERVENTION;  Surgeon: Runell Gess, MD;  [facility]: [facility];  Service: Cardiovascular;  Laterality: Left;  left popliteal  artery   RIGHT/LEFT HEART CATH AND CORONARY ANGIOGRAPHY N/A 10/29/2021    Procedure: RIGHT/LEFT HEART CATH AND CORONARY ANGIOGRAPHY;  Surgeon: Kathleene Hazel, MD;  [facility]: [facility];  Service: Cardiovascular;  Laterality: N/A;   TRANSCAROTID ARTERY REVASCULARIZATION  Left 12/16/2019    Procedure: TRANSCAROTID ARTERY REVASCULARIZATION;  Surgeon: Sherren Kerns, MD;  Location: [facility] OR;  Service: Vascular;  Laterality: Left;   TRANSCAROTID ARTERY REVASCULARIZATION  Right 02/03/2020    Procedure: RIGHT TRANSCAROTID ARTERY REVASCULARIZATION;  Surgeon: Sherren Kerns, MD;  Location: [facility] OR;  Service: Vascular;  Laterality: Right;   TRANSCATHETER AORTIC VALVE REPLACEMENT, TRANSFEMORAL N/A 01/11/2022    Procedure: Transcatheter Aortic Valve Replacement, Transfemoral;  Surgeon: Tonny Bollman, MD;  Location: [facility] INVASIVE CV LAB;  Service: Open Heart Surgery;  Laterality: N/A;   ULTRASOUND GUIDANCE FOR VASCULAR ACCESS Right 12/16/2019    Procedure: ULTRASOUND GUIDANCE FOR VASCULAR ACCESS;  Surgeon: Sherren Kerns, MD;  Location: [facility] OR;  Service: Vascular;  Laterality: Right;   ULTRASOUND GUIDANCE FOR VASCULAR ACCESS Right 02/03/2020    Procedure: ULTRASOUND GUIDANCE FOR VASCULAR ACCESS;  Surgeon: Sherren Kerns, MD;  Location: [facility] OR;  Service: Vascular;  Laterality: Right;  VASECTOMY       VEIN HARVEST Right 06/17/2022    Procedure: IPSILATERAL RIGHT GREATER SAPHENOUS VEIN HARVEST;  Surgeon: Victorino Sparrow, MD;  Location: [facility] OR;  Service: Vascular;  Laterality: Right;         Family History  Problem Relation Age of Onset   CAD Father     Hypertension Father     Alcohol abuse Father          Cause of death   Diabetes Mother     Colon polyps Mother     CAD Brother 12        CABG   Colon cancer Neg Hx     Esophageal cancer Neg Hx     Rectal cancer Neg Hx     Stomach cancer Neg Hx      Social History:  reports that he has never smoked. He has never been exposed to  tobacco smoke. He has never used smokeless tobacco. He reports current alcohol use. He reports that he does not use drugs. Allergies:      Allergies  Allergen Reactions   Codeine Nausea And Vomiting   Percocet [Oxycodone-Acetaminophen] Nausea And Vomiting   Tramadol Hcl Nausea And Vomiting          Medications Prior to Admission  Medication Sig Dispense Refill   acetaminophen (TYLENOL) 500 MG tablet Take 1 tablet (500 mg total) by mouth every 8 (eight) hours as needed for moderate pain. 20 tablet 0   amoxicillin-clavulanate (AUGMENTIN) 500-125 MG tablet Take 1 tablet by mouth 2 (two) times daily. 30 tablet 0   apixaban (ELIQUIS) 5 MG TABS tablet Take 1 tablet (5 mg total) by mouth 2 (two) times daily. 180 tablet 1   aspirin EC 81 MG tablet Take 81 mg by mouth in the morning.       atorvastatin (LIPITOR) 40 MG tablet Take 1 tablet (40 mg total) by mouth in the morning. 30 tablet 0   carvedilol (COREG) 3.125 MG tablet Take 1 tablet (3.125 mg total) by mouth 2 (two) times daily with a meal. 60 tablet 0   Cholecalciferol (VITAMIN D-3) 25 MCG (1000 UT) CAPS Take 1,000 Units by mouth daily.       ferrous sulfate 325 (65 FE) MG tablet Take 325 mg by mouth daily with breakfast.       furosemide (LASIX) 80 MG tablet Take 1 tablet (80 mg total) by mouth daily. And 1 tablet (40 mg) in the evening 90 tablet 0   gabapentin (NEURONTIN) 300 MG capsule Take 1 capsule (300 mg total) by mouth 3 (three) times daily. 90 capsule 0   hydrALAZINE (APRESOLINE) 10 MG tablet Take 1 tablet (10 mg total) by mouth 3 (three) times daily. 90 tablet 0   insulin NPH-regular Human (70-30) 100 UNIT/ML injection Inject 7-8 Units into the skin See admin instructions. Inject 8 units into the skin before breakfast and 7 units before the evening meal       isosorbide mononitrate (IMDUR) 30 MG 24 hr tablet Take 0.5 tablets (15 mg total) by mouth daily. 30 tablet 0   ondansetron (ZOFRAN) 4 MG tablet Take 1 tablet (4 mg total) by  mouth every 8 (eight) hours as needed for nausea or vomiting. 90 tablet 1   polyethylene glycol (MIRALAX / GLYCOLAX) 17 g packet Take 17 g by mouth daily. (Patient taking differently: Take 17 g by mouth daily as needed for mild constipation.) 14 each 0   vitamin B-12 (CYANOCOBALAMIN)  500 MCG tablet Take 500 mcg by mouth in the morning.       Continuous Blood Gluc Sensor (FREESTYLE LIBRE 14 DAY SENSOR) MISC Inject 1 Device into the skin every 14 (fourteen) days.       PRESCRIPTION MEDICATION Inject 0.1-1 mLs as directed See admin instructions. Tri-Mix Standard Strength 5 ml. Formula: Prostaglandin 10 mcg/ml, Papaverine 30 mg/ml, Phentolamine 1 mg/ml. Inject 0.1 ml into side of penis as directed as needed (to be injected immediately before sexual intercourse) may increase the dose by 0.1 ml every 48 hours to achieve an erection. Max dose 1 ml. (Patient not taking: Reported on 08/18/2022)              Home: Home Living Family/patient expects to be discharged to:: Private residence Living Arrangements: Alone Available Help at Discharge: Family, Personal care attendant Type of Home: Apartment Home Access: Level entry Home Layout: One level Bathroom Shower/Tub: Engineer, manufacturing systems: Standard Bathroom Accessibility: Yes Home Equipment: Crutches, Cane - single point, Agricultural consultant (2 wheels), Wheelchair - manual Additional Comments: HH nurse assists with medication management, cousin comes 1x week to bring meals and complete housekeeping. Pt plans on moving in with family in [place] within 5-8 months.   Functional History: Prior Function Prior Level of Function : History of Falls (last six months), Independent/Modified Independent Mobility Comments: Pt uses w/c for mobility, RW for stand pivot transfers. Pt reports one fall 2 weeks ago when transferring to w/c. ADLs Comments: Pt reports mod I with ADLs at w/c level. Recieves assistance for IADLs.   Functional Status:  Mobility: Bed  Mobility Overal bed mobility: Needs Assistance Bed Mobility: Supine to Sit Supine to sit: Supervision, HOB elevated Sit to supine: HOB elevated, Supervision General bed mobility comments: HOB elevated, use of bed rail Transfers Overall transfer level: Needs assistance Equipment used: Rolling walker (2 wheels) Transfers: Sit to/from Stand, Bed to chair/wheelchair/BSC Sit to Stand: Min assist Bed to/from chair/wheelchair/BSC transfer type:: Step pivot Stand pivot transfers: Mod assist, +2 physical assistance, +2 safety/equipment Step pivot transfers: Min assist, +2 safety/equipment  Lateral/Scoot Transfers: Min guard General transfer comment: assist for power up and steadying once standing. stand x3 from EOB and recliner. Ambulation/Gait Ambulation/Gait assistance: Min assist Gait Distance (Feet): 5 Feet Assistive device: Rolling walker (2 wheels) Gait Pattern/deviations: Trunk flexed, Step-to pattern General Gait Details: hop-to pattern, cues for upright posture and taking his time Gait velocity: decr   ADL: ADL Overall ADL's : Needs assistance/impaired Eating/Feeding: Set up, Sitting Grooming: Set up, Sitting Upper Body Bathing: Sitting, Set up Lower Body Bathing: Min guard, Sitting/lateral leans Upper Body Dressing : Set up, Sitting Lower Body Dressing: Sitting/lateral leans, Minimal assistance Lower Body Dressing Details (indicate cue type and reason): Pt donned underwear while sitting/lateral leaning at EOB with min guard A and assist for threading wound vac Toilet Transfer: Min guard (lateral scoot transfer) Toilet Transfer Details (indicate cue type and reason): simulated Toileting- Clothing Manipulation and Hygiene: Min guard, Sitting/lateral lean Functional mobility during ADLs: Min guard General ADL Comments: Pt educated on use of lateral leaning for LB bathing/dressing, pt demonstrated understanding.   Cognition: Cognition Overall Cognitive Status: Within Functional  Limits for tasks assessed Orientation Level: Oriented X4 Cognition Arousal/Alertness: Awake/alert Behavior During Therapy: WFL for tasks assessed/performed Overall Cognitive Status: Within Functional Limits for tasks assessed General Comments: Pleasant and agreeable throughout session   Physical Exam: Blood pressure 107/72, pulse 90, temperature 98.1 F (36.7 C), resp. rate 16, height 5\' 7"  (1.702  m), weight 82.3 kg, SpO2 100 %. Physical Exam Constitutional:      General: He is in acute distress.  HENT:     Head: Normocephalic and atraumatic.  Eyes:     Extraocular Movements: Extraocular movements intact.     Pupils: Pupils are equal, round, and reactive to light.  Cardiovascular:     Rate and Rhythm: Normal rate and regular rhythm.  Pulmonary:     Effort: Pulmonary effort is normal.     Breath sounds: Normal breath sounds.  Abdominal:     General: Bowel sounds are normal.     Palpations: Abdomen is soft.  Musculoskeletal:        General: Normal range of motion.     Left lower leg: No edema.     Comments: Wound VAC in place with good seal  Skin:    General: Skin is warm and dry.  Neurological:     General: No focal deficit present.     Mental Status: He is alert and oriented to person, place, and time.  Psychiatric:        Mood and Affect: Mood normal.        Behavior: Behavior normal.   Motor 4/5 Bilateral grip 5/5 in bialteral delt , Bi, Tri, Hip flexors left knee ext, 3- Left ankle DF PF Sensation absent to LT below the midcalf  Left foot and bilateral hand intrinsice atrophy  MSK- Left foot with 4th and 5th toe amps well healed  Lab Results Last 48 Hours        Results for orders placed or performed during the hospital encounter of 08/18/22 (from the past 48 hour(s))  Glucose, capillary     Status: Abnormal    Collection Time: 08/20/22  3:52 PM  Result Value Ref Range    Glucose-Capillary 135 (H) 70 - 99 mg/dL      Comment: Glucose reference range applies only to  samples taken after fasting for at least 8 hours.  Glucose, capillary     Status: Abnormal    Collection Time: 08/20/22  8:34 PM  Result Value Ref Range    Glucose-Capillary 179 (H) 70 - 99 mg/dL      Comment: Glucose reference range applies only to samples taken after fasting for at least 8 hours.  CBC     Status: Abnormal    Collection Time: 08/21/22  4:34 AM  Result Value Ref Range    WBC 11.0 (H) 4.0 - 10.5 K/uL    RBC 3.32 (L) 4.22 - 5.81 MIL/uL    Hemoglobin 8.6 (L) 13.0 - 17.0 g/dL    HCT 16.1 (L) 09.6 - 52.0 %    MCV 83.1 80.0 - 100.0 fL    MCH 25.9 (L) 26.0 - 34.0 pg    MCHC 31.2 30.0 - 36.0 g/dL    RDW 04.5 (H) 40.9 - 15.5 %    Platelets 515 (H) 150 - 400 K/uL    nRBC 0.0 0.0 - 0.2 %      Comment: Performed at [facility], [address], [place], [place] [address]  Comprehensive metabolic panel     Status: Abnormal    Collection Time: 08/21/22  4:34 AM  Result Value Ref Range    Sodium 130 (L) 135 - 145 mmol/L    Potassium 4.6 3.5 - 5.1 mmol/L    Chloride 98 98 - 111 mmol/L    CO2 23 22 - 32 mmol/L    Glucose, Bld 137 (H)  70 - 99 mg/dL      Comment: Glucose reference range applies only to samples taken after fasting for at least 8 hours.    BUN 31 (H) 8 - 23 mg/dL    Creatinine, Ser 1.61 (H) 0.61 - 1.24 mg/dL    Calcium 8.6 (L) 8.9 - 10.3 mg/dL    Total Protein 7.6 6.5 - 8.1 g/dL    Albumin 2.2 (L) 3.5 - 5.0 g/dL    AST 50 (H) 15 - 41 U/L    ALT 41 0 - 44 U/L    Alkaline Phosphatase 114 38 - 126 U/L    Total Bilirubin 0.4 0.3 - 1.2 mg/dL    GFR, Estimated 59 (L) >60 mL/min      Comment: (NOTE) Calculated using the CKD-EPI Creatinine Equation (2021)      Anion gap 9 5 - 15      Comment: Performed at [facility], [address], [place], [place] [address]  Glucose, capillary     Status: Abnormal    Collection Time: 08/21/22  8:44 AM  Result Value Ref Range    Glucose-Capillary 131 (H) 70 - 99 mg/dL      Comment: Glucose reference range applies  only to samples taken after fasting for at least 8 hours.  Glucose, capillary     Status: Abnormal    Collection Time: 08/21/22 11:57 AM  Result Value Ref Range    Glucose-Capillary 110 (H) 70 - 99 mg/dL      Comment: Glucose reference range applies only to samples taken after fasting for at least 8 hours.  Glucose, capillary     Status: Abnormal    Collection Time: 08/21/22  3:39 PM  Result Value Ref Range    Glucose-Capillary 168 (H) 70 - 99 mg/dL      Comment: Glucose reference range applies only to samples taken after fasting for at least 8 hours.  Glucose, capillary     Status: Abnormal    Collection Time: 08/21/22  9:51 PM  Result Value Ref Range    Glucose-Capillary 203 (H) 70 - 99 mg/dL      Comment: Glucose reference range applies only to samples taken after fasting for at least 8 hours.    Comment 1 Notify RN    Glucose, capillary     Status: Abnormal    Collection Time: 08/22/22  7:16 AM  Result Value Ref Range    Glucose-Capillary 133 (H) 70 - 99 mg/dL      Comment: Glucose reference range applies only to samples taken after fasting for at least 8 hours.  CBC     Status: Abnormal    Collection Time: 08/22/22  8:43 AM  Result Value Ref Range    WBC 10.6 (H) 4.0 - 10.5 K/uL    RBC 3.17 (L) 4.22 - 5.81 MIL/uL    Hemoglobin 8.5 (L) 13.0 - 17.0 g/dL    HCT 54.0 (L) 98.1 - 52.0 %    MCV 83.0 80.0 - 100.0 fL    MCH 26.8 26.0 - 34.0 pg    MCHC 32.3 30.0 - 36.0 g/dL    RDW 19.1 (H) 47.8 - 15.5 %    Platelets 557 (H) 150 - 400 K/uL    nRBC 0.0 0.0 - 0.2 %      Comment: Performed at [facility], [address] [place], [place] [address]  Glucose, capillary     Status: Abnormal    Collection Time: 08/22/22 11:51  AM  Result Value Ref Range    Glucose-Capillary 140 (H) 70 - 99 mg/dL      Comment: Glucose reference range applies only to samples taken after fasting for at least 8 hours.      Imaging Results (Last 48 hours)  No results found.         Blood pressure  107/72, pulse 90, temperature 98.1 F (36.7 C), resp. rate 16, height 5\' 7"  (1.702 m), weight 82.3 kg, SpO2 100 %.   Medical Problem List and Plan: 1. Functional deficits secondary to Right BKA secondary to osteomyelitis RIght foot             -patient may  shower once wound vac is removed             -ELOS/Goals: 7-10d Sup/Mod I goals   2.  Antithrombotics: -DVT/anticoagulation:  Pharmaceutical: Eliquis             -antiplatelet therapy: aspirin 81 mg daily   3. Pain Management: Tylenol as needed             -continue gabapentin 300 mg TID   4. Mood/Behavior/Sleep: LCSW to evaluate and provide emotional support             -antipsychotic agents: n/a   5. Neuropsych/cognition: This patient is capable of making decisions on his own behalf.   6. Skin/Wound Care: Routine skin care checks             -remove Prevena in one week   7. Fluids/Electrolytes/Nutrition: Routine Is and Os and follow-up chemistries             -continue vitamin C, Nepro, Juven, zinc   8: Hypertension: monitor TID and prn; Lasix and hydralazine held due to soft BP             -continue carvedilol 3.125 mg BID             -continue Imdur 15 mg daily   9: Hyperlipidemia: continue statin   10: DM-2: A1c = 7.9%; CBGs QID; home regimen NPH 70/30 8 units before breakfast and 7 units before supper not restarted             -continue SSI   11: HFrEF: current EF ~25-30%; Lasix and hydralazine on hold             -daily weight , monitor for fluid overload             -continue Imdur 15 mg daily             -continue Carvedilol 3.125 mg BID   12: Aortic stenosis s/p TAVR 12/2021   13: History of CAD: Continue statin, aspirin, Eliquis             -follows with Dr. Allyson Sabal   14: Anemia of chronic disease/ABLA: follow-up CBC   15: CKD stage 3a: currently Scr at baseline>>1.28; follow-up BMP   16: PAD: continue statin, aspirin, Eliquis   17: Hyponatremia; chronic: Lasix held; follow-up BMP   18: Leukocytosis:  no fever or known infection; follow-up CBC with diff   19: Thrombocytosis: upward trend; follow-up CBC   20: GERD: continue Protonix   21: Elevated transaminases: improved; follow-up CMP   22: Diabetic neuropathy: continue gabapentin 300 mg TID      Milinda Antis, PA-C 08/22/2022 "I have personally performed a face to face diagnostic evaluation of this patient.  Additionally, I have reviewed and concur with the physician assistant's  documentation above." Erick Colace M.D. [facility] Fellow Am Acad of Phys Med and Rehab Diplomate Am Board of Electrodiagnostic Med Fellow Am Board of Interventional Pain

## 2022-08-22 NOTE — Progress Notes (Addendum)
CM will continue to follow the patient for TOC needs.  The patient lives at home and was admitted for Osteomyelitis of Right lower extremity - wound vac in place post surgery - CIR is following for potential admission.  08/22/22 1010- CM spoke with Dr. Maryfrances Bunnell and patient is medically stable to discharge.  Message sent to [place], CM with CIR to inquire about bed availability/insurance authorization.

## 2022-08-22 NOTE — Progress Notes (Signed)
  Progress Note   Patient: Caleb [name] NFA:[id number] DOB: 1950/07/03 DOA: 08/18/2022     4 DOS: the patient was seen and examined on 08/22/2022 at 10:10AM      Brief hospital course: Mr. Attwood is a 72 y.o. M with DM, HTN, HLD, PVD (bilateral TCAR, bilateral popliteal artery stents, right fem-tib bypass, and hx amputation of L 4th and 5th toes as well as right partial calcaneus excision), sCHF EF 20%, AV stenosis s/p TAVR 10/2021 who was admitted twice for failure to heal his right partial calcaneus excision wound, and was planned for salvage BKA on 5/31 but presented one day early due to untreated pain.     Assessment and Plan: * Osteomyelitis of right lower extremity (HCC) Status post right below-knee amputation and application of Kerecis micrograft and Prevena and stump shrinker on 5/31 by Dr. Lajoyce Corners - PT/OT - Analgesics for post-op pain    AKI (acute kidney injury) (HCC) Cr 2.1 on admission, baseline is 1.5 Cr resolved to baseline post-op and post-transfusion. - Hold furosemide, resume after discharge - Avoid nephrotoxins, hypotension   Chronic hyponatremia Acute on chronic, slightly down post op.   - Hold furosemide  - Trend Na  Chronic systolic CHF (congestive heart failure) (HCC) Appears euvolemic, BP soft - Continue Coreg - Hold furosemide, hydralazine  Peripheral vascular disease (HCC) - Resume Eliquis - Continue atorvastatin and aspirin  Transaminitis LFTs slightly up post op, better today  - Continue atorvastatin for now  Coronary artery disease involving native coronary artery of native heart with angina pectoris (HCC) - Continue statin - Continue BB, Imdur   Anemia of chronic disease Hgb down to 7.9 transfused pre-op --> 9.2 > 8.1 > 8.6 > 8.5 today Hypoproliferative and iron sat 6% 1 week ago, started on IV iron here - IV iron  - Resume Eliquis and trend Hgb - Transfusion threshold 7 g/dL   S/P TAVR (transcatheter aortic valve replacement)     Stage IIIa chronic kidney disease Baseline 1.5-1.6  DM2 (diabetes mellitus, type 2) (HCC) On low dose 70/30 only, glucose good here - Continue SS corrections   Essential hypertension BP soft - Continue Coreg, Imdur - Hold furosemide, hydralazine   Hyperlipidemia - Continue Lipitor          Subjective: Pain controlled, no bleeding, no confusion, no cough, no swelling.  No dyspnea.     Physical Exam: BP 107/72   Pulse 90   Temp 98.1 F (36.7 C)   Resp 16   Ht 5\' 7"  (1.702 m)   Wt 82.3 kg   SpO2 100%   BMI 28.42 kg/m   Thin adult male, watching television, interactive and appropriate RRR, no murmurs, no pitting edema in the left leg Respiratory rate normal, lungs clear without rales or wheezes Abdomen soft without tenderness palpation or guarding Attention normal, affect appropriate, judgment insight appear normal, mild generalized weakness, symmetric strength    Data Reviewed: CBC shows hemoglobin stable at 8.5  Family Communication: None    Disposition: Status is: Inpatient Patient remains medically ready, plan for discharge when CIR is available        Author: Alberteen Sam, MD 08/22/2022 2:30 PM  For on call review [email or web address].

## 2022-08-23 ENCOUNTER — Encounter (HOSPITAL_COMMUNITY): Payer: Self-pay | Admitting: Orthopedic Surgery

## 2022-08-23 DIAGNOSIS — S88111A Complete traumatic amputation at level between knee and ankle, right lower leg, initial encounter: Secondary | ICD-10-CM | POA: Diagnosis not present

## 2022-08-23 LAB — BPAM RBC
Blood Product Expiration Date: 202406252359
Blood Product Expiration Date: 202406252359
Unit Type and Rh: 5100

## 2022-08-23 LAB — TYPE AND SCREEN
ABO/RH(D): O POS
Antibody Screen: POSITIVE
PT AG Type: POSITIVE
Unit division: 0
Unit division: 0

## 2022-08-23 LAB — CBC WITH DIFFERENTIAL/PLATELET
Abs Immature Granulocytes: 0.04 10*3/uL (ref 0.00–0.07)
Basophils Absolute: 0 10*3/uL (ref 0.0–0.1)
Basophils Relative: 0 %
Eosinophils Absolute: 0.6 10*3/uL — ABNORMAL HIGH (ref 0.0–0.5)
Eosinophils Relative: 6 %
HCT: 24.9 % — ABNORMAL LOW (ref 39.0–52.0)
Hemoglobin: 7.9 g/dL — ABNORMAL LOW (ref 13.0–17.0)
Immature Granulocytes: 0 %
Lymphocytes Relative: 23 %
Lymphs Abs: 2.3 10*3/uL (ref 0.7–4.0)
MCH: 26.1 pg (ref 26.0–34.0)
MCHC: 31.7 g/dL (ref 30.0–36.0)
MCV: 82.2 fL (ref 80.0–100.0)
Monocytes Absolute: 0.8 10*3/uL (ref 0.1–1.0)
Monocytes Relative: 8 %
Neutro Abs: 6.2 10*3/uL (ref 1.7–7.7)
Neutrophils Relative %: 63 %
Platelets: 559 10*3/uL — ABNORMAL HIGH (ref 150–400)
RBC: 3.03 MIL/uL — ABNORMAL LOW (ref 4.22–5.81)
RDW: 16.2 % — ABNORMAL HIGH (ref 11.5–15.5)
WBC: 10 10*3/uL (ref 4.0–10.5)
nRBC: 0 % (ref 0.0–0.2)

## 2022-08-23 LAB — COMPREHENSIVE METABOLIC PANEL
ALT: 40 U/L (ref 0–44)
AST: 42 U/L — ABNORMAL HIGH (ref 15–41)
Albumin: 1.9 g/dL — ABNORMAL LOW (ref 3.5–5.0)
Alkaline Phosphatase: 93 U/L (ref 38–126)
Anion gap: 9 (ref 5–15)
BUN: 35 mg/dL — ABNORMAL HIGH (ref 8–23)
CO2: 25 mmol/L (ref 22–32)
Calcium: 8.5 mg/dL — ABNORMAL LOW (ref 8.9–10.3)
Chloride: 97 mmol/L — ABNORMAL LOW (ref 98–111)
Creatinine, Ser: 1.14 mg/dL (ref 0.61–1.24)
GFR, Estimated: >60 mL/min (ref >60)
Glucose, Bld: 156 mg/dL — ABNORMAL HIGH (ref 70–99)
Potassium: 4.7 mmol/L (ref 3.5–5.1)
Sodium: 131 mmol/L — ABNORMAL LOW (ref 135–145)
Total Bilirubin: 0.5 mg/dL (ref 0.3–1.2)
Total Protein: 6.7 g/dL (ref 6.5–8.1)

## 2022-08-23 LAB — GLUCOSE, CAPILLARY
Glucose-Capillary: 138 mg/dL — ABNORMAL HIGH (ref 70–99)
Glucose-Capillary: 155 mg/dL — ABNORMAL HIGH (ref 70–99)
Glucose-Capillary: 167 mg/dL — ABNORMAL HIGH (ref 70–99)
Glucose-Capillary: 162 mg/dL — ABNORMAL HIGH (ref 70–99)

## 2022-08-23 LAB — MAGNESIUM: Magnesium: 2 mg/dL (ref 1.7–2.4)

## 2022-08-23 NOTE — Progress Notes (Signed)
Patient alert and oriented. Patient oriented to floor and made aware of safety and plan of care.Patient verbalizes understanding.Caleb [name]

## 2022-08-23 NOTE — Patient Care Conference (Signed)
Inpatient RehabilitationTeam Conference and Plan of Care Update Date: 08/23/2022   Time: 11:49 AM    Patient Name: Caleb [name]      Medical Record Number: [id number]  Date of Birth: 1950/07/31 Sex: Male         Room/Bed: 4W22C/4W22C-01 Payor Info: Payor: [employer] / Plan: [facility] / Product Type: *No Product type* /    Admit Date/Time:  08/22/2022  6:11 PM  Primary Diagnosis:  Unilateral complete BKA, right, initial encounter [facility])  Hospital Problems: Principal Problem:   Unilateral complete BKA, right, initial encounter [facility])    Expected Discharge Date: Expected Discharge Date:  (TBD)  Team Members Present: Physician leading conference: Dr. Genice Rouge Social Worker Present: Dossie Der, LCSW Nurse Present: Vedia Pereyra, RN PT Present: Truitt Leep, PT OT Present: Lou Cal, OT PPS Coordinator present : Fae Pippin, SLP     Current Status/Progress Goal Weekly Team Focus  Bowel/Bladder   Continent of bowel amd Incontinent of  bladder at times   Regain continence   Assess toileting q shift and as needed    Swallow/Nutrition/ Hydration               ADL's   Min-Mod ADLs   Mod I   AE training as needed    Mobility   bed mobility supervision, transfers with RW min A, gait 50ft with RW min A, WC mobility 139ft supervision   Mod I, Supervision gait  standing tolerance and transfer training    Communication                Safety/Cognition/ Behavioral Observations               Pain   C/O rt BKA phatom pain and neuropathy. Rates pain 7 out 10; scheduled Gabpentin and prn Vicodin q shift as ordered   Pain < or =5   Assess pain q shift and as needed    Skin   Rt BKA with wound vac @ continuous, amputated toes: fourth and fifth   Regain good skin integrity  Assess skin q shift and as needed      Discharge Planning:  new evaluation plan to go to cousin's for two weeks she does work during the  day and has three steps into her apartment. Plans to be mod/i wheelchair level at DC   Team Discussion: Right BKA. Incontinent of bladder with time voids. 7/10 pain managed with PRN medications. Wound Vac in place. Daily weights. ACHS with insulin use at home. Evaluations pending at time of conference.  Patient on target to meet rehab goals: Evaluations.  *See Care Plan and progress notes for long and short-term goals.   Revisions to Treatment Plan:  NA  Teaching Needs: Medications, safety, self care, skin care, transfer training, etc   Current Barriers to Discharge: Decreased caregiver support, Incontinence, Wound care, and Weight bearing restrictions  Possible Resolutions to Barriers: Family education, bladder retraining, NWB to RLE, order recommended DME     Medical Summary Current Status: pt with R BKA- with VAC- constipated  Barriers to Discharge: Complicated Wound;Other (comments);Uncontrolled Pain;Self-care education;Weight bearing restrictions;Medical stability  Barriers to Discharge Comments: VAC on R BKA-pain controlled when takes meds- Possible Resolutions to [employer] Focus: hjasn't been evaluated by OT yet- waiting for that to determine length of stay-   Continued Need for Acute Rehabilitation Level of Care: The patient requires daily medical management by a physician with specialized training in physical medicine and rehabilitation  for the following reasons: Direction of a multidisciplinary physical rehabilitation program to maximize functional independence : Yes Medical management of patient stability for increased activity during participation in an intensive rehabilitation regime.: Yes Analysis of laboratory values and/or radiology reports with any subsequent need for medication adjustment and/or medical intervention. : Yes   I attest that I was present, lead the team conference, and concur with the assessment and plan of the team.   Jearld Adjutant 08/23/2022, 4:59 PM

## 2022-08-23 NOTE — Progress Notes (Signed)
Physical Therapy Session Note  Patient Details  Name: Caleb [name] MRN: [id number] Date of Birth: November 26, 1950  Today's Date: 08/23/2022 PT Individual Time: 4098-1191 PT Individual Time Calculation (min): 55 min   Short Term Goals: Week 1:  PT Short Term Goal 1 (Week 1): STG=LTG due to LOS  Skilled Therapeutic Interventions/Progress Updates:    Chart reviewed and pt agreeable to therapy. Pt received semi-reclined in bed with 6/10 c/o phantom pain. Session focused on functional transfers, WC mobility, and amb practice to promote safe home access. Pt initiated session with transfer to [facility] using CGA + RW. Pt also required ModA for LB dressing. Pt then completed 261ft + 336ft + 350ft Wc mobility that including going around turns, backing up, and navigating tight spaces with S and periodic MinA. Pt noted to have challenge with LUE endurance for WC mobility. Pt then amb 49ft with CGA + RW. In room, pt required CGA + no AD for transfer back to bed. Session education emphasized safe navigation with WC and BUE utilization in WC mobility. At end of session, pt was left semi-reclined in bed with alarm engaged, nurse call bell and all needs in reach.     Therapy Documentation Precautions:  Precautions Precautions: Fall Precaution Comments: R BKA with wound vac Required Braces or Orthoses: Other Brace Other Brace: limb protector Restrictions Weight Bearing Restrictions: Yes RLE Weight Bearing: Non weight bearing     Therapy/Group: Individual Therapy  Dionne Milo 08/23/2022, 3:38 PM

## 2022-08-23 NOTE — Discharge Summary (Incomplete)
Physician Discharge Summary  Patient ID: Caleb [name] MRN: [id number] DOB/AGE: 72-Sep-1952 72 y.o.  Admit date: 08/22/2022 Discharge date: 09/05/2022  Discharge Diagnoses:  Principal Problem:   Unilateral complete BKA, right, initial encounter [facility]) Active problems: Debility secondary to right BKA Hypertension Hyperlipidemia DM-2 HFrEF Anemia CKD stage IIIa PAD Hyponatremia Thrombocytosis GERD Elevated transaminases Diabetic neuropathy Phantom pain  Discharged Condition: {condition:18240}  Significant Diagnostic Studies:  Labs:  Basic Metabolic Panel: Recent Labs  Lab 08/18/22 1751 08/19/22 0601 08/20/22 0317 08/21/22 0434 08/23/22 0606  NA 129* 131* 127* 130* 131*  K 4.5 4.5 4.4 4.6 4.7  CL 92* 94* 94* 98 97*  CO2 25 25 23 23 25   GLUCOSE 147* 154* 151* 137* 156*  BUN 39* 33* 28* 31* 35*  CREATININE 2.15* 1.94* 1.54* 1.28* 1.14  CALCIUM 8.2* 8.9 8.2* 8.6* 8.5*  MG  --  2.4  --   --  2.0    CBC: Recent Labs  Lab 08/18/22 1751 08/19/22 0601 08/20/22 0317 08/21/22 0434 08/22/22 0843 08/23/22 0606  WBC 12.1* 11.0*   < > 11.0* 10.6* 10.0  NEUTROABS 9.0* 7.8*  --   --   --  6.2  HGB 7.9* 9.2*   < > 8.6* 8.5* 7.9*  HCT 24.9* 29.1*   < > 27.6* 26.3* 24.9*  MCV 85.0 83.9   < > 83.1 83.0 82.2  PLT 516* 552*   < > 515* 557* 559*   < > = values in this interval not displayed.    CBG: Recent Labs  Lab 08/22/22 0716 08/22/22 1151 08/22/22 1615 08/22/22 2112 08/23/22 0602  GLUCAP 133* 140* 211* 157* 138*    Brief HPI:   Caleb [name] is a 72 y.o. male with a history of chronic right foot osteomyelitis and is status post multiple surgical procedures.  He had most recently declined right below the knee amputation, but after reconsideration this was scheduled with Dr. Lajoyce Corners.  The patient presented to the emergency department on 08/18/2022, one day before surgery, complaining of worsening pain.  His past medical history is significant for congestive heart  failure, diabetes mellitus, hypercholesterolemia, hyperlipidemia, hypertension, MGUS, peripheral vascular disease, aortic stenosis status post TAVR in 2023. He is status post revascularization of the right lower extremity.  Despite revascularization, he did not have sufficient circulation to heal the gangrenous heel ulcer status post foot salvage intervention.  He was admitted by the hospitalist service and taken to the operating room on 5/31 and underwent right below the knee amputation by Dr. Lajoyce Corners with placement of Prevena wound VAC dressing.  His serum creatinine was elevated to 2.1 and his baseline is 1.5 with history of CKD stage 3a.  Laboratory workup reveals chronic hyponatremia and serum sodium was trended. He was transfused 2 units of PRBCs. Received IV iron. He is tolerating carb modified diet.   Hospital Course: Caleb [name] was admitted to rehab 08/22/2022 for inpatient therapies to consist of PT, ST and OT at least three hours five days a week. Past admission physiatrist, therapy team and rehab RN have worked together to provide customized collaborative inpatient rehab. Follow-up labs with decreased hgb to 7.9 and BUN up to 35. Complained of residual stump pain not controlled with meds. Conferred with Dr. Lajoyce Corners and [name] Holstein removed on 6/06. Leukocytosis resolved and platelet count stable. Gabapentin increased 6/09. H and H now 8.5/27.7. BUN elevated to 77 and lasix held. Requiring oral Dilaudid for phantom pain control. Follow-up labs on 6/14 with K+  to 6.1 and was repeated>>5.4. given one dose of Lokelma. Serum sodium stable and BUN/serum creatinine 62/1.37.    Blood pressures were monitored on TID basis and Imdur 15 mg and carvedilol 3.125 mg BID continued.  Diabetes has been monitored with ac/hs CBG checks and SSI was use prn for tighter BS control. Restarted 70/30 4 units BID with meals on 6/06. Increased to 5 units BID on 6/07.    Rehab course: During patient's stay in rehab weekly team  conferences were held to monitor patient's progress, set goals and discuss barriers to discharge. At admission, patient required min with basic self-care skills and min with mobility.    He has had improvement in activity tolerance, balance, postural control as well as ability to compensate for deficits. He has had improvement in functional use RUE/LUE  and RLE/LLE as well as improvement in awareness. Worked on Ship broker therapy.       Disposition:  There are no questions and answers to display.         Diet:  Special Instructions:   Allergies as of 08/23/2022       Reactions   Codeine Nausea And Vomiting   Percocet [oxycodone-acetaminophen] Nausea And Vomiting   Tramadol Hcl Nausea And Vomiting     Med Rec must be completed prior to using this [employer]***       Follow-up Information     Hoy Register, MD Follow up.   Specialty: Family Medicine Why: Call the office in 1-2 days to make arrangements for hosptial follow-up appointment. Contact information: [address] [place] [place] [address] [phone]         Nadara Mustard, MD Follow up.   Specialty: Orthopedic Surgery Why: Call the office in 1-2 days to make arrangements for hosptial follow-up appointment. Contact information: [address] [place] [address] [phone]         Genice Rouge, MD Follow up.   Specialty: Physical Medicine and Rehabilitation Contact information: [address] [place] [address] [phone]                 Signed: Milinda Antis 08/23/2022, 8:49 AM

## 2022-08-23 NOTE — Progress Notes (Signed)
Inpatient Rehabilitation Admission Medication Review by a Pharmacist  A complete drug regimen review was completed for this patient to identify any potential clinically significant medication issues.  High Risk Drug Classes Is patient taking? Indication by Medication  Antipsychotic Yes Compazine - prn N/V  Anticoagulant Yes Eliquis - PVD  Antibiotic No   Opioid Yes Vicodin - prn pain  Antiplatelet No   Hypoglycemics/insulin Yes SSI - DM  Vasoactive Medication Yes Carvedilol, Imdur - CAD, HTN  Chemotherapy No   Other Yes Atorvastatin - HLD IV iron - anemia Gabapentin - pain Pantoprazole - reflux     Type of Medication Issue Identified Description of Issue Recommendation(s)  Drug Interaction(s) (clinically significant)     Duplicate Therapy     Allergy     No Medication Administration End Date     Incorrect Dose     Additional Drug Therapy Needed     Significant med changes from prior encounter (inform family/care partners about these prior to discharge). Furosemide 80 am  / 40 mg PM Hydralazine 10 mg po TID  Not yet resumed - resume if and when appropriate  Other       Clinically significant medication issues were identified that warrant physician communication and completion of prescribed/recommended actions by midnight of the next day:  No  Pharmacist comments: None  Time spent performing this drug regimen review (minutes):  20 minutes  Thank you Okey Regal, PharmD

## 2022-08-23 NOTE — Progress Notes (Signed)
PROGRESS NOTE   Subjective/Complaints:  Pt reports got pain meds 30 minutes prior and pain down to 3.5-4/10 now- made it much more tolerable.   Slept great Ate 100% tray Peed bed last night because couldn't find call bell.  LBM 3 days ago, but feels like can go today.   Getting IV iron, so needs his IV.    ROS:  Pt denies SOB, abd pain, CP, N/V/C/D, and vision changes Except for HPI Objective:   No results found. Recent Labs    08/22/22 0843 08/23/22 0606  WBC 10.6* 10.0  HGB 8.5* 7.9*  HCT 26.3* 24.9*  PLT 557* 559*   Recent Labs    08/21/22 0434 08/23/22 0606  NA 130* 131*  K 4.6 4.7  CL 98 97*  CO2 23 25  GLUCOSE 137* 156*  BUN 31* 35*  CREATININE 1.28* 1.14  CALCIUM 8.6* 8.5*    Intake/Output Summary (Last 24 hours) at 08/23/2022 0842 Last data filed at 08/23/2022 0700 Gross per 24 hour  Intake 118 ml  Output 1150 ml  Net -1032 ml        Physical Exam: Vital Signs Blood pressure 122/82, pulse 97, temperature 98.3 F (36.8 C), temperature source Oral, resp. rate 18, height 5' 6.93" (1.7 m), weight 79.4 kg, SpO2 100 %.   General: awake, alert, appropriate, sitting up in bed; messing with  limb guard; NAD HENT: conjugate gaze; oropharynx moist CV: regular to borderline tachycardic rate; regular rhythm; no JVD Pulmonary: CTA B/L; no W/R/R- good air movement- no fluid overload sound GI: soft, NT, slightly distended vs protuberant; hypoactive BS Psychiatric: appropriate- interactive Neurological: Ox3 Musculoskeletal:        General: Normal range of motion.     Left lower leg: No edema.     Comments: Wound VAC in place with good seal -nothing in cannister- has L BKA in shrinker with limb guard on it Skin:    General: Skin is warm and dry.  Neurological:     General: No focal deficit present.     Mental Status: He is alert and oriented to person, place, and time.  Psychiatric:        Mood  and Affect: Mood normal.        Behavior: Behavior normal.   Motor 4/5 Bilateral grip 5/5 in bialteral delt , Bi, Tri, Hip flexors left knee ext, 3- Left ankle DF PF Sensation absent to LT below the midcalf  Left foot and bilateral hand intrinsice atrophy  MSK- Left foot with 4th and 5th toe amps well healed   Assessment/Plan: 1. Functional deficits which require 3+ hours per day of interdisciplinary therapy in a comprehensive inpatient rehab setting. Physiatrist is providing close team supervision and 24 hour management of active medical problems listed below. Physiatrist and rehab team continue to assess barriers to discharge/monitor patient progress toward functional and medical goals  Care Tool:  Bathing              Bathing assist       Upper Body Dressing/Undressing Upper body dressing        Upper body assist      Lower Body Dressing/Undressing Lower  body dressing            Lower body assist       Toileting Toileting    Toileting assist       Transfers Chair/bed transfer  Transfers assist           Locomotion Ambulation   Ambulation assist              Walk 10 feet activity   Assist           Walk 50 feet activity   Assist           Walk 150 feet activity   Assist           Walk 10 feet on uneven surface  activity   Assist           Wheelchair     Assist               Wheelchair 50 feet with 2 turns activity    Assist            Wheelchair 150 feet activity     Assist          Blood pressure 122/82, pulse 97, temperature 98.3 F (36.8 C), temperature source Oral, resp. rate 18, height 5' 6.93" (1.7 m), weight 79.4 kg, SpO2 100 %.  Medical Problem List and Plan: 1. Functional deficits secondary to Right BKA secondary to osteomyelitis RIght foot             -patient may  shower once wound vac is removed             -ELOS/Goals: 7-10d Sup/Mod I goals   First day of  evaluations- con't CIR PT and OT Team conference today to determine length of stay 2.  Antithrombotics: -DVT/anticoagulation:  Pharmaceutical: Eliquis             -antiplatelet therapy: aspirin 81 mg daily   3. Pain Management: Tylenol as needed             -continue gabapentin 300 mg TID   6/4- on Norco as needed q4 hours prn for pain- is working so far 4. Mood/Behavior/Sleep: LCSW to evaluate and provide emotional support             -antipsychotic agents: n/a   5. Neuropsych/cognition: This patient is capable of making decisions on his own behalf.   6. Skin/Wound Care: Routine skin care checks             -remove Prevena in one week   7. Fluids/Electrolytes/Nutrition: Routine Is and Os and follow-up chemistries             -continue vitamin C, Nepro, Juven, zinc   8: Hypertension: monitor TID and prn; Lasix and hydralazine held due to soft BP             -continue carvedilol 3.125 mg BID             -continue Imdur 15 mg daily   6/4- BP controlled currently- con't regimen 9: Hyperlipidemia: continue statin   10: DM-2: A1c = 7.9%; CBGs QID; home regimen NPH 70/30 8 units before breakfast and 7 units before supper not restarted             -continue SSI   6/4- Will restart insulin once CBGs a little higher- right now mainly mid 100s- with 1 value 211- I'm concerned about starting insulin at thiese levels- don't want hypoglycemia 11: HFrEF: current EF ~  25-30%; Lasix and hydralazine on hold             -daily weight , monitor for fluid overload             -continue Imdur 15 mg daily             -continue Carvedilol 3.125 mg BID   6/4- Weight down 1 kg- will monitor daily-  12: Aortic stenosis s/p TAVR 12/2021   13: History of CAD: Continue statin, aspirin, Eliquis             -follows with Dr. Allyson Sabal   14: Anemia of chronic disease/ABLA: follow-up CBC   6/4- Hb down to 7.9 from 8.5- wil recheck Thursday 15: CKD stage 3a: currently Scr at baseline>>1.28; follow-up BMP   6/4-  Cr down to 1.14 form 1.28 and Cr 35 up from 31- will recheck Thursday  16: PAD: continue statin, aspirin, Eliquis   17: Hyponatremia; chronic: Lasix held; follow-up BMP   6/4- Na 131- on high side for him- will monitor 18: Leukocytosis: no fever or known infection; follow-up CBC with diff   6/4- WBC down to normal levels- 10k- will monitor 19: Thrombocytosis: upward trend; follow-up CBC   6/4- Plts stable at 59k 20: GERD: continue Protonix   21: Elevated transaminases: improved; follow-up CMP   AST very slightly elevated at 42- but otherwise resolved 22: Diabetic neuropathy: continue gabapentin 300 mg TID    I spent a total of  56  minutes on total care today- >50% coordination of care- due to complex medical issues- review of chartl, labs and vitals and team conference to determine length of stay       LOS: 1 days A FACE TO FACE EVALUATION WAS PERFORMED  Majel Giel 08/23/2022, 8:42 AM

## 2022-08-23 NOTE — Evaluation (Signed)
Occupational Therapy Assessment and Plan  Patient Details  Name: Caleb [name] MRN: [id number] Date of Birth: 1950/08/18  OT Diagnosis: acute pain, pain in joint, and swelling of limb Rehab Potential: Rehab Potential (ACUTE ONLY): Good ELOS: 7-10 days   Today's Date: 08/23/2022 OT Individual Time: 1400-1505 OT Individual Time Calculation (min): 65 min     Hospital Problem: Principal Problem:   Unilateral complete BKA, right, initial encounter [facility])   Past Medical History:  Past Medical History:  Diagnosis Date   Carotid artery occlusion    CHF (congestive heart failure) (HCC)    Diabetes mellitus without complication (HCC)    Type II   Dyslipidemia 09/27/2012   Erectile dysfunction 09/27/2012   GERD (gastroesophageal reflux disease)    Hypercholesteremia    Hyperlipidemia 08/12/2012   Hypertension    Hyponatremia 08/14/2012   MGUS (monoclonal gammopathy of unknown significance)    Neuropathy    Pancreatitis    Pancreatitis, acute 09/27/2012   Peripheral vascular disease (HCC)    S/P 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 Excell Seltzer & Bartle   Severe aortic stenosis    Vitamin D deficiency 06/23/2015   Past Surgical History:  Past Surgical History:  Procedure Laterality Date   ABDOMINAL AORTOGRAM W/LOWER EXTREMITY N/A 08/08/2019   Procedure: ABDOMINAL AORTOGRAM W/LOWER EXTREMITY;  Surgeon: Runell Gess, MD;  [facility]: [facility] INVASIVE CV LAB;  Service: Cardiovascular;  Laterality: N/A;   ABDOMINAL AORTOGRAM W/LOWER EXTREMITY N/A 11/18/2019   Procedure: ABDOMINAL AORTOGRAM W/LOWER EXTREMITY;  Surgeon: Runell Gess, MD;  Location: [facility];  Service: Cardiovascular;  Laterality: N/A;   ABDOMINAL AORTOGRAM W/LOWER EXTREMITY N/A 06/13/2022   Procedure: ABDOMINAL AORTOGRAM W/LOWER EXTREMITY;  Surgeon: Chuck Hint, MD;  Location: [facility] INVASIVE CV LAB;  Service: Cardiovascular;   Laterality: N/A;   AMPUTATION Right 08/19/2022   Procedure: RIGHT BELOW KNEE AMPUTATION;  Surgeon: Nadara Mustard, MD;  Location: [facility] OR;  Service: Orthopedics;  Laterality: Right;   APPLICATION OF WOUND VAC  06/24/2022   Procedure: APPLICATION OF WOUND VAC;  Surgeon: Nadara Mustard, MD;  Location: [facility] OR;  Service: Orthopedics;;   CARDIAC CATHETERIZATION     COLONOSCOPY  12 years ago   in [place] clinic= normal exam per pt   ENDARTERECTOMY FEMORAL Right 06/17/2022   Procedure: SUPERFICIAL ENDARTERECTOMY OF COMMON FEMORAL ARTERY;  Surgeon: Victorino Sparrow, MD;  Location: [facility] OR;  Service: Vascular;  Laterality: Right;   FEMORAL-TIBIAL BYPASS GRAFT Right 06/17/2022   Procedure: RIGHT FEMORAL- ANTERIOR TIBIAL ARTERY BYPASS;  Surgeon: Victorino Sparrow, MD;  Location: [facility] OR;  Service: Vascular;  Laterality: Right;   I & D EXTREMITY Right 06/24/2022   Procedure: RIGHT PARTIAL CALCANEUS EXCISION;  Surgeon: Nadara Mustard, MD;  [facility]: [facility] OR;  Service: Orthopedics;  Laterality: Right;   I & D EXTREMITY Right 07/29/2022   Procedure: RIGHT PARTIAL CALCANEUS EXCISION;  Surgeon: Nadara Mustard, MD;  Location: [facility] OR;  Service: Orthopedics;  Laterality: Right;   INTRAOPERATIVE TRANSTHORACIC ECHOCARDIOGRAM N/A 01/11/2022   Procedure: INTRAOPERATIVE TRANSTHORACIC ECHOCARDIOGRAM;  Surgeon: Tonny Bollman, MD;  Location: [facility] INVASIVE CV LAB;  Service: Open Heart Surgery;  Laterality: N/A;   KNEE SURGERY     MULTIPLE EXTRACTIONS WITH ALVEOLOPLASTY N/A 12/09/2021   Procedure: MULTIPLE EXTRACTION;  Surgeon: Sharman Cheek, DMD;  Location: [facility] OR;  Service: Dentistry;  Laterality: N/A;   PERIPHERAL VASCULAR  INTERVENTION Right 08/08/2019   Procedure: PERIPHERAL VASCULAR INTERVENTION;  Surgeon: Runell Gess, MD;  Location: [facility] INVASIVE CV LAB;  Service: Cardiovascular;  Laterality: Right;   PERIPHERAL VASCULAR INTERVENTION Left 11/18/2019   Procedure: PERIPHERAL VASCULAR INTERVENTION;  Surgeon: Runell Gess, MD;   [facility];  Service: Cardiovascular;  Laterality: Left;  left popliteal artery   RIGHT/LEFT HEART CATH AND CORONARY ANGIOGRAPHY N/A 10/29/2021   Procedure: RIGHT/LEFT HEART CATH AND CORONARY ANGIOGRAPHY;  Surgeon: Kathleene Hazel, MD;  [facility];  Service: Cardiovascular;  Laterality: N/A;   TRANSCAROTID ARTERY REVASCULARIZATION  Left 12/16/2019   Procedure: TRANSCAROTID ARTERY REVASCULARIZATION;  Surgeon: Sherren Kerns, MD;  Location: [facility] OR;  Service: Vascular;  Laterality: Left;   TRANSCAROTID ARTERY REVASCULARIZATION  Right 02/03/2020   Procedure: RIGHT TRANSCAROTID ARTERY REVASCULARIZATION;  Surgeon: Sherren Kerns, MD;  Location: [facility] OR;  Service: Vascular;  Laterality: Right;   TRANSCATHETER AORTIC VALVE REPLACEMENT, TRANSFEMORAL N/A 01/11/2022   Procedure: Transcatheter Aortic Valve Replacement, Transfemoral;  Surgeon: Tonny Bollman, MD;  Location: [facility] INVASIVE CV LAB;  Service: Open Heart Surgery;  Laterality: N/A;   ULTRASOUND GUIDANCE FOR VASCULAR ACCESS Right 12/16/2019   Procedure: ULTRASOUND GUIDANCE FOR VASCULAR ACCESS;  Surgeon: Sherren Kerns, MD;  Location: [facility] OR;  Service: Vascular;  Laterality: Right;   ULTRASOUND GUIDANCE FOR VASCULAR ACCESS Right 02/03/2020   Procedure: ULTRASOUND GUIDANCE FOR VASCULAR ACCESS;  Surgeon: Sherren Kerns, MD;  Location: [facility] OR;  Service: Vascular;  Laterality: Right;   VASECTOMY     VEIN HARVEST Right 06/17/2022   Procedure: IPSILATERAL RIGHT GREATER SAPHENOUS VEIN HARVEST;  Surgeon: Victorino Sparrow, MD;  Location: [facility] OR;  Service: Vascular;  Laterality: Right;    Assessment & Plan Clinical Impression: Caleb [name] is a 72 year old diabetic male with a history of chronic right foot osteomyelitis and is status post multiple surgical procedures.  He had most recently declined right below the knee amputation, but after reconsideration this was scheduled with Dr. Lajoyce Corners.  The patient  presented to the emergency department on 08/18/2022, one day before surgery, complaining of worsening pain.  His past medical history is significant for congestive heart failure, diabetes mellitus, hypercholesterolemia, hyperlipidemia, hypertension, MGUS, peripheral vascular disease, aortic stenosis status post TAVR in 2023. He is status post revascularization of the right lower extremity.  Despite revascularization, he did not have sufficient circulation to heal the gangrenous heel ulcer status post foot salvage intervention.  He was admitted by the hospitalist service and taken to the operating room on 5/31 and underwent right below the knee amputation by Dr. Lajoyce Corners with placement of Prevena wound VAC dressing.  His serum creatinine was elevated to 2.1 and his baseline is 1.5 with history of CKD stage 3a.  Laboratory workup reveals chronic hyponatremia and serum sodium was trended. He was transfused 2 units of PRBCs. Received IV iron. He is tolerating carb modified diet. The patient requires inpatient medicine and rehabilitation evaluations and services for ongoing dysfunction secondary to osteomyelitis, gangrene of right foot s/p right BKA.   PMH also significant for bilateral carotid artery stenosis s/ right left TCAR.Patient transferred to CIR on 08/22/2022 .    Patient currently requires min with basic self-care skills secondary to muscle weakness, decreased cardiorespiratoy endurance, and decreased sitting balance, decreased standing balance, and decreased balance strategies.  Prior to hospitalization, patient could complete BADLs with modified independent .  Patient will benefit from skilled intervention to decrease level of  assist with basic self-care skills, increase independence with basic self-care skills, and increase level of independence with iADL prior to discharge home with care partner.  Anticipate patient will require intermittent supervision and no further OT follow recommended.  OT - End of  Session Activity Tolerance: Decreased this session Endurance Deficit: Yes Endurance Deficit Description: required rest breaks throughout session OT Assessment Rehab Potential (ACUTE ONLY): Good OT Patient demonstrates impairments in the following area(s): Balance;Safety;Sensory;Skin Integrity;Endurance;Motor;Pain;Edema OT Basic ADL's Functional Problem(s): Bathing;Dressing;Toileting OT Advanced ADL's Functional Problem(s): Simple Meal Preparation OT Transfers Functional Problem(s): Tub/Shower;Toilet OT Additional Impairment(s): None OT Plan OT Intensity: Minimum of 1-2 x/day, 45 to 90 minutes OT Frequency: 5 out of 7 days OT Duration/Estimated Length of Stay: 7-10 days OT Treatment/Interventions: Balance/vestibular training;Discharge planning;Pain management;Self Care/advanced ADL retraining;Therapeutic Activities;UE/LE Coordination activities;Disease mangement/prevention;Functional mobility training;Patient/family education;Skin care/wound managment;Therapeutic Exercise;Visual/perceptual remediation/compensation;Community reintegration;DME/adaptive equipment instruction;Neuromuscular re-education;Psychosocial support;Splinting/orthotics;UE/LE Strength taining/ROM;Wheelchair propulsion/positioning OT Self Feeding Anticipated Outcome(s): Mod I OT Basic Self-Care Anticipated Outcome(s): Mod I OT Toileting Anticipated Outcome(s): Mod I OT Bathroom Transfers Anticipated Outcome(s): Mod I-Supervision OT Recommendation Recommendations for Other Services: Therapeutic Recreation consult Therapeutic Recreation Interventions: Pet therapy;Other (comment) (dance group) Patient destination: Home Follow Up Recommendations: None Equipment Recommended: To be determined   OT Evaluation Precautions/Restrictions  Precautions Precautions: Fall Precaution Comments: R BKA with wound vac Required Braces or Orthoses: Other Brace Other Brace: limb protector Restrictions Weight Bearing Restrictions:  Yes RLE Weight Bearing: Non weight bearing General Chart Reviewed: Yes Additional Pertinent History: congestive heart failure, diabetes mellitus, hypercholesterolemia, hyperlipidemia, hypertension, MGUS, peripheral vascular disease, aortic stenosis status post TAVR in 2023 Family/Caregiver Present: No Vital Signs Therapy Vitals Temp: 98.3 F (36.8 C) Temp Source: Oral Pulse Rate: 93 Resp: 18 BP: 106/66 Patient Position (if appropriate): Lying Oxygen Therapy SpO2: 100 % O2 Device: Room Air Pain Pain Assessment Pain Scale: 0-10 Pain Score: 0-No pain Home Living/Prior Functioning Home Living Family/patient expects to be discharged to:: Private residence Living Arrangements: Alone Available Help at Discharge: Family (couson can come by to assist with IADLs) Type of Home: Apartment Home Access: Level entry Home Layout: One level Bathroom Shower/Tub: Engineer, manufacturing systems: Standard Bathroom Accessibility: Yes Additional Comments: has RW and WC. 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 Prior Function Level of Independence: Requires assistive device for independence  Able to Take Stairs?: No Driving: No (hasn't driven past 5 months, but was driving prior) Vision Baseline Vision/History: 1 Wears glasses Ability to See in Adequate Light: 0 Adequate Patient Visual Report: No change from baseline Vision Assessment?: No apparent visual deficits Perception  Perception: Not tested Praxis Praxis: Not  tested Cognition Cognition Overall Cognitive Status: Within Functional Limits for tasks assessed Arousal/Alertness: Awake/alert Orientation Level: Person;Place;Situation Person: Oriented Place: Oriented Situation: Oriented Memory: Appears intact Awareness: Appears intact Problem Solving: Appears intact Safety/Judgment: Appears intact Brief Interview for Mental Status (BIMS) Repetition of Three Words (First Attempt): 3 Temporal Orientation: Year: Correct Temporal Orientation: Month: Accurate within 5 days Temporal Orientation: Day: Correct Recall: "Sock": Yes, no cue required Recall: "Blue": Yes, no cue required Recall: "Bed": Yes, no cue required BIMS Summary Score: 15 Sensation Sensation Light Touch: Impaired by gross assessment  Hot/Cold: Not tested Proprioception: Impaired by gross assessment Stereognosis: Not tested Additional Comments: absent sensation from L ankle and below and along lateral and distal aspect of R residual limb. Neuropathy in both UEs. Coordination Gross Motor Movements are Fluid and Coordinated: No Fine Motor Movements are Fluid and Coordinated: No Coordination and Movement Description: altered balance strategies due to R BKA, neuropathy, and pain Finger Nose Finger Test: slow bilaterally Motor  Motor Motor: Other (comment) Motor - Skilled Clinical Observations: altered balance strategies due to R BKA, neuropathy, and pain  Trunk/Postural Assessment  Cervical Assessment Cervical Assessment: Within Functional Limits Thoracic Assessment Thoracic Assessment: Within Functional Limits Lumbar Assessment Lumbar Assessment: Exceptions to [facility] (posterior pelvic tilt) Postural Control Postural Control: Deficits on evaluation Protective Responses: delayed on R  Balance Balance Balance Assessed: Yes Static Sitting Balance Static Sitting - Balance Support: Feet supported;Bilateral upper extremity supported Static Sitting - Level of Assistance: 5: Stand by  assistance (supervision) Dynamic Sitting Balance Dynamic Sitting - Balance Support: Feet supported;No upper extremity supported Dynamic Sitting - Level of Assistance: 5: Stand by assistance (supervision-CGA) Sitting balance - Comments: sitting EOB Static Standing Balance Static Standing - Balance Support: Bilateral upper extremity supported;During functional activity Static Standing - Level of Assistance: 5: Stand by assistance (CGA) Dynamic Standing Balance Dynamic Standing - Balance Support: Bilateral upper extremity supported;During functional activity (RW) Dynamic Standing - Level of Assistance: 4: Min assist Dynamic Standing - Comments: during BADLs Extremity/Trunk Assessment RUE Assessment RUE Assessment: Within Functional Limits Passive Range of Motion (PROM) Comments: WFL Active Range of Motion (AROM) Comments: WFL General Strength Comments: decreased strength d/t deconditioning 4/5 overall LUE Assessment LUE Assessment: Within Functional Limits Passive Range of Motion (PROM) Comments: WFL Active Range of Motion (AROM) Comments: WFL General Strength Comments: decreased strength d/t deconditioning 4/5 overall  Care Tool Care Tool Self Care Eating   Eating Assist Level: Set up assist    Oral Care    Oral Care Assist Level: Supervision/Verbal cueing    Bathing   Body parts bathed by patient: Right arm;Left arm;Chest;Abdomen;Front perineal area;Buttocks;Face;Left lower leg;Left upper leg;Right upper leg   Body parts n/a: Right lower leg Assist Level: Contact Guard/Touching assist    Upper Body Dressing(including orthotics)   What is the patient wearing?: Pull over shirt   Assist Level: Supervision/Verbal cueing    Lower Body Dressing (excluding footwear)   What is the patient wearing?: Pants;Underwear/pull up Assist for lower body dressing: Moderate Assistance - Patient 50 - 74%    Putting on/Taking off footwear   What is the patient wearing?: Socks (limb  guard) Assist for footwear: Minimal Assistance - Patient > 75%       Care Tool Toileting Toileting activity   Assist for toileting: Minimal Assistance - Patient > 75%     Care Tool Bed Mobility Roll left and right activity   Roll left and right assist level: Supervision/Verbal cueing    Sit to lying activity   Sit to lying assist level: Supervision/Verbal cueing    Lying to sitting on side of bed activity   Lying to sitting on side of bed assist level: the ability to move from lying on the back to sitting on the side of the bed with no back support.: Supervision/Verbal cueing     Care Tool Transfers Sit to stand transfer   Sit to stand assist level: Minimal Assistance - Patient > 75%    Chair/bed transfer   Chair/bed transfer assist level: Minimal Assistance - Patient >  75%     Toilet transfer   Assist Level: Minimal Assistance - Patient > 75%     Care Tool Cognition  Expression of Ideas and Wants Expression of Ideas and Wants: 4. Without difficulty (complex and basic) - expresses complex messages without difficulty and with speech that is clear and easy to understand  Understanding Verbal and Non-Verbal Content Understanding Verbal and Non-Verbal Content: 4. Understands (complex and basic) - clear comprehension without cues or repetitions   Memory/Recall Ability Memory/Recall Ability : Current season;Staff names and faces;That he or she is in a hospital/hospital unit   Refer to Care Plan for Long Term Goals  SHORT TERM GOAL WEEK 1 OT Short Term Goal 1 (Week 1): LTG=STG d/t Pt ELOS  Recommendations for other services: Therapeutic Recreation  Pet therapy and Other dance group    Skilled Therapeutic Intervention Skilled Therapeutic Interventions/Progress Updates:  1:1 OT evaluation and intervention initiated with skilled education provided on OT role, goals, and POC. Pt received semi-reclined in bed presenting to be in good spirits and receptive to skilled OT session  reporting mild pain in RLE- received pain medications prior to session and OT offering intermittent rest breaks, therapeutic support, and repositioning to decrease pain. Pt able to complete BADLs at levels listed below completing bathing and dressing tasks seated EOB or in wc. Pt able to perform transfers at levels listed below with verbal and tactile cues requires for technique, safety, and positioning. Pt presenting with phantom limb pain/sensations, decreased endurance, and balance deficits this session. Pt would benefit from continued OT services in IPR setting.  ADL ADL Eating: Set up Where Assessed-Eating: Edge of bed Grooming: Supervision/safety Where Assessed-Grooming: Wheelchair;Edge of bed Upper Body Bathing: Supervision/safety Where Assessed-Upper Body Bathing: Edge of bed Lower Body Bathing: Contact guard Where Assessed-Lower Body Bathing: Edge of bed Upper Body Dressing: Supervision/safety Where Assessed-Upper Body Dressing: Wheelchair Lower Body Dressing: Supervision/safety Where Assessed-Lower Body Dressing: Wheelchair Toileting: Minimal assistance Where Assessed-Toileting: Teacher, adult education: Curator Method: Surveyor, minerals: Engineer, technical sales: Not assessed Film/video editor: Not assessed Mobility  Bed Mobility Bed Mobility: Rolling Right;Rolling Left;Sit to Supine;Supine to Sit Rolling Right: Supervision/verbal cueing Rolling Left: Supervision/Verbal cueing Supine to Sit: Supervision/Verbal cueing Sit to Supine: Supervision/Verbal cueing Transfers Sit to Stand: Minimal Assistance - Patient > 75% Stand to Sit: Contact Guard/Touching assist   Discharge Criteria: Patient will be discharged from OT if patient refuses treatment 3 consecutive times without medical reason, if treatment goals not met, if there is a change in medical status, if patient makes no progress towards goals or if patient is  discharged from hospital.  The above assessment, treatment plan, treatment alternatives and goals were discussed and mutually agreed upon: by patient  Army Fossa 08/23/2022, 4:18 PM

## 2022-08-23 NOTE — Progress Notes (Signed)
Met with patient briefly. Oriented to rehab and team conference every Tuesday. Going home to cousin's house the first 2 weeks, thinks there are rails at entry both front and back. Takes insulin at home. Verified that needs to work on his diet. Let him know that have diet for CHF, CKD, and prediabetes.  Said that needs to change what he eats but not any of those.  Let him know that it's not that bad.  Needs to increase good cholesterol with foods and diet. Verified that he doesn't have a chest bulb and I will remove.  Updated his incisions as well as the incision for the BKA was not in the system.  Therapy working with patient. All needs met.

## 2022-08-23 NOTE — Progress Notes (Signed)
Inpatient Rehabilitation Care Coordinator Assessment and Plan Patient Details  Name: Caleb [name] MRN: [id number] Date of Birth: [birth year]  Today's Date: 08/23/2022  Hospital Problems: Principal Problem:   Unilateral complete BKA, right, initial encounter [facility])  Past Medical History:  Past Medical History:  Diagnosis Date   Carotid artery occlusion    CHF (congestive heart failure) (HCC)    Diabetes mellitus without complication (HCC)    Type II   Dyslipidemia 09/27/2012   Erectile dysfunction 09/27/2012   GERD (gastroesophageal reflux disease)    Hypercholesteremia    Hyperlipidemia 08/12/2012   Hypertension    Hyponatremia 08/14/2012   MGUS (monoclonal gammopathy of unknown significance)    Neuropathy    Pancreatitis    Pancreatitis, acute 09/27/2012   Peripheral vascular disease (HCC)    S/P 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 Excell Seltzer & Bartle   Severe aortic stenosis    Vitamin D deficiency 06/23/2015   Past Surgical History:  Past Surgical History:  Procedure Laterality Date   ABDOMINAL AORTOGRAM W/LOWER EXTREMITY N/A 08/08/2019   Procedure: ABDOMINAL AORTOGRAM W/LOWER EXTREMITY;  Surgeon: Runell Gess, MD;  Location: [facility] INVASIVE CV LAB;  Service: Cardiovascular;  Laterality: N/A;   ABDOMINAL AORTOGRAM W/LOWER EXTREMITY N/A 11/18/2019   Procedure: ABDOMINAL AORTOGRAM W/LOWER EXTREMITY;  Surgeon: Runell Gess, MD;  Location: [facility];  Service: Cardiovascular;  Laterality: N/A;   ABDOMINAL AORTOGRAM W/LOWER EXTREMITY N/A 06/13/2022   Procedure: ABDOMINAL AORTOGRAM W/LOWER EXTREMITY;  Surgeon: Chuck Hint, MD;  Location: [facility] INVASIVE CV LAB;  Service: Cardiovascular;  Laterality: N/A;   AMPUTATION Right 08/19/2022   Procedure: RIGHT BELOW KNEE AMPUTATION;  Surgeon: Nadara Mustard, MD;  Location: [facility] OR;  Service: Orthopedics;  Laterality: Right;    APPLICATION OF WOUND VAC  06/24/2022   Procedure: APPLICATION OF WOUND VAC;  Surgeon: Nadara Mustard, MD;  Location: [facility] OR;  Service: Orthopedics;;   CARDIAC CATHETERIZATION     COLONOSCOPY  12 years ago   in [place] clinic= normal exam per pt   ENDARTERECTOMY FEMORAL Right 06/17/2022   Procedure: SUPERFICIAL ENDARTERECTOMY OF COMMON FEMORAL ARTERY;  Surgeon: Victorino Sparrow, MD;  Location: [facility] OR;  Service: Vascular;  Laterality: Right;   FEMORAL-TIBIAL BYPASS GRAFT Right 06/17/2022   Procedure: RIGHT FEMORAL- ANTERIOR TIBIAL ARTERY BYPASS;  Surgeon: Victorino Sparrow, MD;  Location: [facility] OR;  Service: Vascular;  Laterality: Right;   I & D EXTREMITY Right 06/24/2022   Procedure: RIGHT PARTIAL CALCANEUS EXCISION;  Surgeon: Nadara Mustard, MD;  Location: [facility] OR;  Service: Orthopedics;  Laterality: Right;   I & D EXTREMITY Right 07/29/2022   Procedure: RIGHT PARTIAL CALCANEUS EXCISION;  Surgeon: Nadara Mustard, MD;  Location: [facility] OR;  Service: Orthopedics;  Laterality: Right;   INTRAOPERATIVE TRANSTHORACIC ECHOCARDIOGRAM N/A 01/11/2022   Procedure: INTRAOPERATIVE TRANSTHORACIC ECHOCARDIOGRAM;  Surgeon: Tonny Bollman, MD;  Location: [facility] INVASIVE CV LAB;  Service: Open Heart Surgery;  Laterality: N/A;   KNEE SURGERY     MULTIPLE EXTRACTIONS WITH ALVEOLOPLASTY N/A 12/09/2021   Procedure: MULTIPLE EXTRACTION;  Surgeon: Sharman Cheek, DMD;  Location: [facility] OR;  Service: Dentistry;  Laterality: N/A;   PERIPHERAL VASCULAR INTERVENTION Right 08/08/2019   Procedure: PERIPHERAL VASCULAR INTERVENTION;  Surgeon: Runell Gess, MD;  Location: [facility];  Service: Cardiovascular;  Laterality: Right;   PERIPHERAL VASCULAR INTERVENTION Left 11/18/2019   Procedure:  PERIPHERAL VASCULAR INTERVENTION;  Surgeon: Runell Gess, MD;  Location: [facility] INVASIVE CV LAB;  Service: Cardiovascular;  Laterality: Left;  left popliteal artery   RIGHT/LEFT HEART CATH AND CORONARY ANGIOGRAPHY N/A 10/29/2021   Procedure: RIGHT/LEFT  HEART CATH AND CORONARY ANGIOGRAPHY;  Surgeon: Kathleene Hazel, MD;  Location: [facility];  Service: Cardiovascular;  Laterality: N/A;   TRANSCAROTID ARTERY REVASCULARIZATION  Left 12/16/2019   Procedure: TRANSCAROTID ARTERY REVASCULARIZATION;  Surgeon: Sherren Kerns, MD;  Location: [facility] OR;  Service: Vascular;  Laterality: Left;   TRANSCAROTID ARTERY REVASCULARIZATION  Right 02/03/2020   Procedure: RIGHT TRANSCAROTID ARTERY REVASCULARIZATION;  Surgeon: Sherren Kerns, MD;  Location: [facility] OR;  Service: Vascular;  Laterality: Right;   TRANSCATHETER AORTIC VALVE REPLACEMENT, TRANSFEMORAL N/A 01/11/2022   Procedure: Transcatheter Aortic Valve Replacement, Transfemoral;  Surgeon: Tonny Bollman, MD;  Location: [facility] INVASIVE CV LAB;  Service: Open Heart Surgery;  Laterality: N/A;   ULTRASOUND GUIDANCE FOR VASCULAR ACCESS Right 12/16/2019   Procedure: ULTRASOUND GUIDANCE FOR VASCULAR ACCESS;  Surgeon: Sherren Kerns, MD;  Location: [facility] OR;  Service: Vascular;  Laterality: Right;   ULTRASOUND GUIDANCE FOR VASCULAR ACCESS Right 02/03/2020   Procedure: ULTRASOUND GUIDANCE FOR VASCULAR ACCESS;  Surgeon: Sherren Kerns, MD;  Location: [facility] OR;  Service: Vascular;  Laterality: Right;   VASECTOMY     VEIN HARVEST Right 06/17/2022   Procedure: IPSILATERAL RIGHT GREATER SAPHENOUS VEIN HARVEST;  Surgeon: Victorino Sparrow, MD;  Location: [facility] OR;  Service: Vascular;  Laterality: Right;   Social History:  reports that he has never smoked. He has never been exposed to tobacco smoke. He has never used smokeless tobacco. He reports current alcohol use. He reports that he does not use drugs.  Family / Support Systems Marital Status: Widow/Widower Patient Roles: Parent, Other (Comment) (cousin) Children: Caleb [name] [phone]  Caleb [name] [phone]  Caleb Palms [phone] Other Supports: Maurie Boettcher [phone] Anticipated Caregiver: Mindi Junker Ability/Limitations of Caregiver: Mindi Junker works during  the day pt plans to go to her home for two weeks then eventually move to [place] to be closer to his three daughter's Caregiver Availability: Evenings only Family Dynamics: Close with daughter's who are involved but do live in [place] and work. He plans to eventually move to be closer to them once recovered.  Social History Preferred language: English Religion: Christian Cultural Background: No issues Education: HS Health Literacy - How often do you need to have someone help you when you read instructions, pamphlets, or other written material from your doctor or pharmacy?: Never Writes: Yes Employment Status: Retired Marine scientist Issues: no issues Guardian/Conservator: none-according to MD pt is capable of making his own decisions while here.   Abuse/Neglect Abuse/Neglect Assessment Can Be Completed: Yes Physical Abuse: Denies Verbal Abuse: Denies Sexual Abuse: Denies Exploitation of patient/patient's resources: Denies Self-Neglect: Denies  Patient response to: Social Isolation - How often do you feel lonely or isolated from those around you?: Never  Emotional Status Pt's affect, behavior and adjustment status: Pt is motivated to do well and recover from his amputation. He has always been able to take care of himself and plans on again. He has learned to adapt with his leg issues since MD has been trying to save his leg for a few months. Recent Psychosocial Issues: other health issues Psychiatric History: No history is very positive and states: " This is just a bump in the road" He seems to be coping appropriately and dealing with his loss as well as can be  expected. Substance Abuse History: No issues  Patient / Family Perceptions, Expectations & Goals Pt/Family understanding of illness & functional limitations: Pt is able to explain his amputation and the reason for this. He does talk with the surgeon and rounding MD and feels his questions and concerns are being  addressed. Premorbid pt/family roles/activities: father, grandfather, veteran, retiree, cousin, etc Anticipated changes in roles/activities/participation: resume Pt/family expectations/goals: Pt states: " I will adjust and get thorugh ths like I have other issues in my life, just a bump in the road."  [employer]: Other (Comment) Frances Furbish was seeing prior to [employer] OT RN) Premorbid Home Care/DME Agencies: Other (Comment) (wheelchair, cane,, rw) Transportation available at discharge: cousin Is the patient able to respond to transportation needs?: Yes In the past 12 months, has lack of transportation kept you from medical appointments or from getting medications?: No In the past 12 months, has lack of transportation kept you from meetings, work, or from getting things needed for daily living?: No  Discharge Planning Living Arrangements: Alone Support Systems: Children, Other relatives, Friends/neighbors Type of Residence: Private residence Insurance Resources: Media planner (specify) ([employer] Medicare and VA) Financial Resources: Restaurant manager, fast food Screen Referred: No Living Expenses: Rent Money Management: Patient Does the patient have any problems obtaining your medications?: No Home Management: self and cousin to assist Patient/Family Preliminary Plans: Plans to go to cousin's home for two weeks then back to his apartment. Cousin does have three steps to get into her home and will need to work on this while here. He hopes to be mod/i by discharge. Aware being evaluated today with goals being set for CIR Care Coordinator Barriers to Discharge: Decreased caregiver support Care Coordinator Anticipated Follow Up Needs: HH/OP  Clinical Impression Pleasant gentleman who is motivated to do well here and has good family support via daughter's and his cousin. Plans to go to cousin's home for two weeks but she does work during the day. Will await team's  evaluations and work on discharge needs.   Lucy Chris 08/23/2022, 9:54 AM

## 2022-08-23 NOTE — Evaluation (Signed)
Physical Therapy Assessment and Plan  Patient Details  Name: Caleb [name] MRN: [id number] Date of Birth: [birth year]  PT Diagnosis: Abnormal posture, Abnormality of gait, Difficulty walking, Edema, Impaired sensation, Muscle weakness, and Pain in R residual limb Rehab Potential: Good ELOS: 10 days   Today's Date: 08/23/2022 PT Individual Time: 7829-5621 PT Individual Time Calculation (min): 69 min    Hospital Problem: Principal Problem:   Unilateral complete BKA, right, initial encounter [facility])   Past Medical History:  Past Medical History:  Diagnosis Date   Carotid artery occlusion    CHF (congestive heart failure) (HCC)    Diabetes mellitus without complication (HCC)    Type II   Dyslipidemia 09/27/2012   Erectile dysfunction 09/27/2012   GERD (gastroesophageal reflux disease)    Hypercholesteremia    Hyperlipidemia 08/12/2012   Hypertension    Hyponatremia 08/14/2012   MGUS (monoclonal gammopathy of unknown significance)    Neuropathy    Pancreatitis    Pancreatitis, acute 09/27/2012   Peripheral vascular disease (HCC)    S/P 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 Excell Seltzer & Bartle   Severe aortic stenosis    Vitamin D deficiency 06/23/2015   Past Surgical History:  Past Surgical History:  Procedure Laterality Date   ABDOMINAL AORTOGRAM W/LOWER EXTREMITY N/A 08/08/2019   Procedure: ABDOMINAL AORTOGRAM W/LOWER EXTREMITY;  Surgeon: Runell Gess, MD;  [facility]: [facility] INVASIVE CV LAB;  Service: Cardiovascular;  Laterality: N/A;   ABDOMINAL AORTOGRAM W/LOWER EXTREMITY N/A 11/18/2019   Procedure: ABDOMINAL AORTOGRAM W/LOWER EXTREMITY;  Surgeon: Runell Gess, MD;  Location: [facility] IN[facility] CV LAB;  Service: Cardiovascular;  Laterality: N/A;   ABDOMINAL AORTOGRAM W/LOWER EXTREMITY N/A 06/13/2022   Procedure: ABDOMINAL AORTOGRAM W/LOWER EXTREMITY;  Surgeon: Chuck Hint, MD;  Location:  [facility] INVASIVE CV LAB;  Service: Cardiovascular;  Laterality: N/A;   AMPUTATION Right 08/19/2022   Procedure: RIGHT BELOW KNEE AMPUTATION;  Surgeon: Nadara Mustard, MD;  Location: [facility] OR;  Service: Orthopedics;  Laterality: Right;   APPLICATION OF WOUND VAC  06/24/2022   Procedure: APPLICATION OF WOUND VAC;  Surgeon: Nadara Mustard, MD;  Location: [facility] OR;  Service: Orthopedics;;   CARDIAC CATHETERIZATION     COLONOSCOPY  12 years ago   in [place] clinic= normal exam per pt   ENDARTERECTOMY FEMORAL Right 06/17/2022   Procedure: SUPERFICIAL ENDARTERECTOMY OF COMMON FEMORAL ARTERY;  Surgeon: Victorino Sparrow, MD;  Location: [facility] OR;  Service: Vascular;  Laterality: Right;   FEMORAL-TIBIAL BYPASS GRAFT Right 06/17/2022   Procedure: RIGHT FEMORAL- ANTERIOR TIBIAL ARTERY BYPASS;  Surgeon: Victorino Sparrow, MD;  Location: [facility] OR;  Service: Vascular;  Laterality: Right;   I & D EXTREMITY Right 06/24/2022   Procedure: RIGHT PARTIAL CALCANEUS EXCISION;  Surgeon: Nadara Mustard, MD;  [facility]: [facility] OR;  Service: Orthopedics;  Laterality: Right;   I & D EXTREMITY Right 07/29/2022   Procedure: RIGHT PARTIAL CALCANEUS EXCISION;  Surgeon: Nadara Mustard, MD;  Location: [facility] OR;  Service: Orthopedics;  Laterality: Right;   INTRAOPERATIVE TRANSTHORACIC ECHOCARDIOGRAM N/A 01/11/2022   Procedure: INTRAOPERATIVE TRANSTHORACIC ECHOCARDIOGRAM;  Surgeon: Tonny Bollman, MD;  Location: [facility] INVASIVE CV LAB;  Service: Open Heart Surgery;  Laterality: N/A;   KNEE SURGERY     MULTIPLE EXTRACTIONS WITH ALVEOLOPLASTY N/A 12/09/2021   Procedure: MULTIPLE EXTRACTION;  Surgeon: Sharman Cheek, DMD;  Location: [facility] OR;  Service: Dentistry;  Laterality: N/A;  PERIPHERAL VASCULAR INTERVENTION Right 08/08/2019   Procedure: PERIPHERAL VASCULAR INTERVENTION;  Surgeon: Runell Gess, MD;  Location: [facility] INVASIVE CV LAB;  Service: Cardiovascular;  Laterality: Right;   PERIPHERAL VASCULAR INTERVENTION Left 11/18/2019   Procedure: PERIPHERAL VASCULAR  INTERVENTION;  Surgeon: Runell Gess, MD;  [facility];  Service: Cardiovascular;  Laterality: Left;  left popliteal artery   RIGHT/LEFT HEART CATH AND CORONARY ANGIOGRAPHY N/A 10/29/2021   Procedure: RIGHT/LEFT HEART CATH AND CORONARY ANGIOGRAPHY;  Surgeon: Kathleene Hazel, MD;  [facility];  Service: Cardiovascular;  Laterality: N/A;   TRANSCAROTID ARTERY REVASCULARIZATION  Left 12/16/2019   Procedure: TRANSCAROTID ARTERY REVASCULARIZATION;  Surgeon: Sherren Kerns, MD;  Location: [facility] OR;  Service: Vascular;  Laterality: Left;   TRANSCAROTID ARTERY REVASCULARIZATION  Right 02/03/2020   Procedure: RIGHT TRANSCAROTID ARTERY REVASCULARIZATION;  Surgeon: Sherren Kerns, MD;  Location: [facility] OR;  Service: Vascular;  Laterality: Right;   TRANSCATHETER AORTIC VALVE REPLACEMENT, TRANSFEMORAL N/A 01/11/2022   Procedure: Transcatheter Aortic Valve Replacement, Transfemoral;  Surgeon: Tonny Bollman, MD;  Location: [facility] INVASIVE CV LAB;  Service: Open Heart Surgery;  Laterality: N/A;   ULTRASOUND GUIDANCE FOR VASCULAR ACCESS Right 12/16/2019   Procedure: ULTRASOUND GUIDANCE FOR VASCULAR ACCESS;  Surgeon: Sherren Kerns, MD;  Location: [facility] OR;  Service: Vascular;  Laterality: Right;   ULTRASOUND GUIDANCE FOR VASCULAR ACCESS Right 02/03/2020   Procedure: ULTRASOUND GUIDANCE FOR VASCULAR ACCESS;  Surgeon: Sherren Kerns, MD;  Location: [facility] OR;  Service: Vascular;  Laterality: Right;   VASECTOMY     VEIN HARVEST Right 06/17/2022   Procedure: IPSILATERAL RIGHT GREATER SAPHENOUS VEIN HARVEST;  Surgeon: Victorino Sparrow, MD;  Location: [facility] OR;  Service: Vascular;  Laterality: Right;    Assessment & Plan Clinical Impression: Patient is a 72 y.o. year old male with a history of chronic right foot osteomyelitis and is status post multiple surgical procedures.  He had most recently declined right below the knee amputation, but after reconsideration this was  scheduled with Dr. Lajoyce Corners.  The patient presented to the emergency department on 08/18/2022, one day before surgery, complaining of worsening pain.  His past medical history is significant for congestive heart failure, diabetes mellitus, hypercholesterolemia, hyperlipidemia, hypertension, MGUS, peripheral vascular disease, aortic stenosis status post TAVR in 2023. He is status post revascularization of the right lower extremity.  Despite revascularization, he did not have sufficient circulation to heal the gangrenous heel ulcer status post foot salvage intervention.  He was admitted by the hospitalist service and taken to the operating room on 5/31 and underwent right below the knee amputation by Dr. Lajoyce Corners with placement of Prevena wound VAC dressing.  His serum creatinine was elevated to 2.1 and his baseline is 1.5 with history of CKD stage 3a.  Laboratory workup reveals chronic hyponatremia and serum sodium was trended. He was transfused 2 units of PRBCs. Received IV iron. He is tolerating carb modified diet. The patient requires inpatient medicine and rehabilitation evaluations and services for ongoing dysfunction secondary to osteomyelitis, gangrene of right foot s/p right BKA.   PMH also significant for bilateral carotid artery stenosis s/ right left TCAR.  Patient currently requires min with mobility secondary to muscle weakness and muscle joint tightness, decreased cardiorespiratoy endurance, and decreased standing balance, decreased postural control, decreased balance strategies, and difficulty maintaining precautions.  Prior to hospitalization, patient was modified independent  with mobility and lived with Alone in a Apartment home.  Home access is  Level entry.  Patient will benefit from skilled PT intervention to maximize safe functional mobility, minimize fall risk, and decrease caregiver burden for planned discharge home with intermittent assist.  Anticipate patient will benefit from follow up [facility] at  discharge.  PT - End of Session Activity Tolerance: Tolerates 30+ min activity with multiple rests Endurance Deficit: Yes Endurance Deficit Description: required rest breaks throughout session PT Assessment Rehab Potential (ACUTE/IP ONLY): Good PT Barriers to Discharge: Decreased caregiver support;Wound Care;Lack of/limited family support;Weight bearing restrictions;Other (comments) PT Barriers to Discharge Comments: pain, lives alone PT Patient demonstrates impairments in the following area(s): Balance;Edema;Endurance;Motor;Nutrition;Pain;Sensory;Skin Integrity PT Transfers Functional Problem(s): Bed to Chair;Bed Mobility;Car;Furniture PT Locomotion Functional Problem(s): Ambulation;Wheelchair Mobility;Stairs PT Plan PT Intensity: Minimum of 1-2 x/day ,45 to 90 minutes PT Frequency: 5 out of 7 days PT Duration Estimated Length of Stay: 10 days PT Treatment/Interventions: Ambulation/gait training;Discharge planning;Functional mobility training;Psychosocial support;Therapeutic Activities;Visual/perceptual remediation/compensation;Balance/vestibular training;Disease management/prevention;Neuromuscular re-education;Skin care/wound management;Therapeutic Exercise;Wheelchair propulsion/positioning;DME/adaptive equipment instruction;Pain management;Splinting/orthotics;UE/LE Strength taining/ROM;Community reintegration;Patient/family education;Stair training;UE/LE Coordination activities PT Transfers Anticipated Outcome(s): Mod I with LRAD PT Locomotion Anticipated Outcome(s): supervision with LRAD PT Recommendation Follow Up Recommendations: Home health PT Patient destination: Home Equipment Recommended: To be determined Equipment Details: has RW and WC. Will need R amputee support pad   PT Evaluation Precautions/Restrictions Precautions Precautions: Fall Precaution Comments: R BKA with wound vac Required Braces or Orthoses: Other Brace Other Brace: limb protector Restrictions Weight  Bearing Restrictions: Yes RLE Weight Bearing: Non weight bearing Pain Interference Pain Interference Pain Effect on Sleep: 2. Occasionally Pain Interference with Therapy Activities: 1. Rarely or not at all Pain Interference with Day-to-Day Activities: 1. Rarely or not at all Home Living/Prior Functioning Home Living Living Arrangements: Alone Available Help at Discharge: Family (has cousin who can come by after work (works 8-5)) Type of Home: Apartment Home Access: Level entry Home Layout: One level Bathroom Shower/Tub: Engineer, manufacturing systems: Standard Bathroom Accessibility: Yes Additional Comments: has RW and WC. Pt reports being WC/RW bound for past 3 months  Lives With: Alone Prior Function Level of Independence: Requires assistive device for independence  Able to Take Stairs?: No Driving: No (hasn't driven in 5 months) Vision/Perception  Vision - History Ability to See in Adequate Light: 0 Adequate Perception Perception: Not tested Praxis Praxis: Not tested  Cognition Overall Cognitive Status: Within Functional Limits for tasks assessed Arousal/Alertness: Awake/alert Orientation Level: Oriented X4 Memory: Appears intact Awareness: Appears intact Problem Solving: Appears intact Safety/Judgment: Appears intact Sensation Sensation Light Touch: Impaired by gross assessment Hot/Cold: Not tested Proprioception: Impaired by gross assessment Stereognosis: Not tested Additional Comments: absent sensation from L ankle and below and along lateral and distal aspect of R residual limb. Neuropathy in both UEs. Coordination Gross Motor Movements are Fluid and Coordinated: No Fine Motor Movements are Fluid and Coordinated: No Coordination and Movement Description: altered balance strategies due to R BKA, neuropathy, and pain Finger Nose Finger Test: slow bilaterally Heel Shin Test: unable to perform on RLE due to BKA, WFL on LLE Motor  Motor Motor: Other  (comment) Motor - Skilled Clinical Observations: altered balance strategies due to R BKA, neuropathy, and pain  Trunk/Postural Assessment  Cervical Assessment Cervical Assessment: Within Functional Limits Thoracic Assessment Thoracic Assessment: Within Functional Limits Lumbar Assessment Lumbar Assessment: Exceptions to [facility] (posterior pelvic tilt) Postural Control Postural Control: Deficits on evaluation Protective Responses: delayed on R  Balance Balance Balance Assessed: Yes Static Sitting Balance Static Sitting - Balance Support: Feet supported;Bilateral upper extremity supported Static Sitting -  Level of Assistance: 5: Stand by assistance (supervision) Dynamic Sitting Balance Dynamic Sitting - Balance Support: Feet supported;No upper extremity supported Dynamic Sitting - Level of Assistance: 5: Stand by assistance (supervision) Static Standing Balance Static Standing - Balance Support: Bilateral upper extremity supported;During functional activity (RW) Static Standing - Level of Assistance: 5: Stand by assistance (CGA) Dynamic Standing Balance Dynamic Standing - Balance Support: Bilateral upper extremity supported;During functional activity (RW) Dynamic Standing - Level of Assistance: 4: Min assist Dynamic Standing - Comments: with transfers and gait Extremity Assessment  RLE Assessment RLE Assessment: Exceptions to [facility] General Strength Comments: tested sitting EOB - limited by limb guard RLE Strength Right Hip Flexion: 3+/5 Right Hip Extension: 3+/5 Right Hip ABduction: 3+/5 Right Hip ADduction: 3+/5 LLE Assessment LLE Assessment: Exceptions to [facility] General Strength Comments: tested sitting EOB LLE Strength Left Hip Flexion: 4-/5 Left Hip ABduction: 4-/5 Left Hip ADduction: 4-/5 Left Knee Flexion: 4-/5 Left Knee Extension: 4-/5 Left Ankle Dorsiflexion: 4/5 Left Ankle Plantar Flexion: 4/5  Care Tool Care Tool Bed Mobility Roll left and right activity   Roll left  and right assist level: Supervision/Verbal cueing    Sit to lying activity   Sit to lying assist level: Supervision/Verbal cueing    Lying to sitting on side of bed activity   Lying to sitting on side of bed assist level: the ability to move from lying on the back to sitting on the side of the bed with no back support.: Supervision/Verbal cueing     Care Tool Transfers Sit to stand transfer   Sit to stand assist level: Minimal Assistance - Patient > 75%    Chair/bed transfer   Chair/bed transfer assist level: Minimal Assistance - Patient > 75%     Scientist, research (physical sciences) transfer activity did not occur: Safety/medical concerns (pain)        Care Tool Locomotion Ambulation   Assist level: Minimal Assistance - Patient > 75% Assistive device: Walker-rolling Max distance: 28ft  Walk 10 feet activity   Assist level: Minimal Assistance - Patient > 75% Assistive device: Walker-rolling   Walk 50 feet with 2 turns activity Walk 50 feet with 2 turns activity did not occur: Safety/medical concerns (pain)      Walk 150 feet activity Walk 150 feet activity did not occur: Safety/medical concerns (pain)      Walk 10 feet on uneven surfaces activity Walk 10 feet on uneven surfaces activity did not occur: Safety/medical concerns (pain)      Stairs Stair activity did not occur: Safety/medical concerns (pain)        Walk up/down 1 step activity Walk up/down 1 step or curb (drop down) activity did not occur: Safety/medical concerns (pain)      Walk up/down 4 steps activity Walk up/down 4 steps activity did not occur: Safety/medical concerns (pain)      Walk up/down 12 steps activity Walk up/down 12 steps activity did not occur: Safety/medical concerns (pain)      Pick up small objects from floor Pick up small object from the floor (from standing position) activity did not occur: Safety/medical concerns (pain)      Wheelchair Is the patient using a wheelchair?:  Yes Type of Wheelchair: Manual   Wheelchair assist level: Supervision/Verbal cueing Max wheelchair distance: 170ft  Wheel 50 feet with 2 turns activity   Assist Level: Supervision/Verbal cueing  Wheel 150 feet activity   Assist Level: Supervision/Verbal cueing  Refer to Care Plan for Long Term Goals  SHORT TERM GOAL WEEK 1 PT Short Term Goal 1 (Week 1): STG=LTG due to LOS  Recommendations for other services: None   Skilled Therapeutic Intervention Evaluation completed (see details above and below) with education on PT POC and goals and individual treatment initiated with focus on dressing, functional mobility/transfers, generalized strengthening and endurance, dynamic standing balance/coordination, WC mobility, and ambulation. Received pt semi-reclined in bed, pt educated on PT evaluation, CIR policies, and therapy schedule and agreeable. Pt reported pain 2-3/10 in R residual limb - RN present to administer medication and care coordinator present. Provided pt with18x16 manual WC with R amputee support pad, RW, and extra scrub clothing. Donned underwear and shorts in supine with mod A (due to neuropathy in hands) with assist to manage wound vac with increased time - pt able to roll L/R with supervision using bedrails. Pt transferred supine<>sitting EOB from flat bed using bedrails and supervision and donned clean pull over shirt. Pt stood from elevated EOB with RW and min A and transferred bed<>WC stand<>pivot with RW and min A. Pt performed WC mobility 158ft using BUE and supervision with WC gloves on. Stood from [facility] with RW and min A and ambulated 44ft with RW and min A using "hop to" pattern - noted good control and soft landing on L foot. Pt limited by increased pain to 5/10 and requested to return to bed. Stood from [facility] with RW pushing up with RUE on WC armrest rather than on RW, resulting in LOB requiring mod A to correct. Instructed pt to stand with RUE on RW and LUE pushing frm WC armrest  and pt stood with min A and transferred stand<>pivot back to bed. Returned to supine with supervision and RN notified and present to administer medication and connect IV.  Concluded session with pt semi-reclined in bed, needs within reach, and bed alarm on. Safety plan updated.   Mobility Bed Mobility Bed Mobility: Rolling Right;Rolling Left;Sit to Supine;Supine to Sit Rolling Right: Supervision/verbal cueing Rolling Left: Supervision/Verbal cueing Supine to Sit: Supervision/Verbal cueing Sit to Supine: Supervision/Verbal cueing Transfers Transfers: Sit to Stand;Stand to Sit;Stand Pivot Transfers Sit to Stand: Minimal Assistance - Patient > 75% Stand to Sit: Contact Guard/Touching assist Stand Pivot Transfers: Minimal Assistance - Patient > 75% Stand Pivot Transfer Details: Verbal cues for safe use of DME/AE;Verbal cues for precautions/safety Stand Pivot Transfer Details (indicate cue type and reason): verbal cues for hand placement on RW and WC armrests Transfer (Assistive device): Rolling walker Locomotion  Gait Ambulation: Yes Gait Assistance: Minimal Assistance - Patient > 75% Gait Distance (Feet): 22 Feet Assistive device: Rolling walker Gait Gait: Yes Gait Pattern: Impaired ("hop to" pattern) Gait velocity: decreased Stairs / Additional Locomotion Stairs: No Wheelchair Mobility Wheelchair Mobility: Yes Wheelchair Assistance: Doctor, general practice: Both upper extremities Wheelchair Parts Management: Needs assistance Distance: 155ft   Discharge Criteria: Patient will be discharged from PT if patient refuses treatment 3 consecutive times without medical reason, if treatment goals not met, if there is a change in medical status, if patient makes no progress towards goals or if patient is discharged from hospital.  The above assessment, treatment plan, treatment alternatives and goals were discussed and mutually agreed upon: by patient  Huntley Dec PT, DPT 08/23/2022, 11:37 AM

## 2022-08-23 NOTE — Progress Notes (Signed)
Inpatient Rehabilitation  Patient information reviewed and entered into eRehab system by Jefry Lesinski M. Larkin Morelos, M.A., CCC/SLP, PPS Coordinator.  Information including medical coding, functional ability and quality indicators will be reviewed and updated through discharge.    

## 2022-08-23 NOTE — Plan of Care (Signed)
  Problem: RH Balance Goal: LTG Patient will maintain dynamic standing with ADLs (OT) Description: LTG:  Patient will maintain dynamic standing balance with assist during activities of daily living (OT)  Flowsheets (Taken 08/23/2022 1626) LTG: Pt will maintain dynamic standing balance during ADLs with: Independent with assistive device   Problem: Sit to Stand Goal: LTG:  Patient will perform sit to stand in prep for activites of daily living with assistance level (OT) Description: LTG:  Patient will perform sit to stand in prep for activites of daily living with assistance level (OT) Flowsheets (Taken 08/23/2022 1626) LTG: PT will perform sit to stand in prep for activites of daily living with assistance level: Independent with assistive device   Problem: RH Bathing Goal: LTG Patient will bathe all body parts with assist levels (OT) Description: LTG: Patient will bathe all body parts with assist levels (OT) Flowsheets (Taken 08/23/2022 1626) LTG: Pt will perform bathing with assistance level/cueing: Independent with assistive device  LTG: Position pt will perform bathing: Lateral leans   Problem: RH Dressing Goal: LTG Patient will perform lower body dressing w/assist (OT) Description: LTG: Patient will perform lower body dressing with assist, with/without cues in positioning using equipment (OT) Flowsheets (Taken 08/23/2022 1626) LTG: Pt will perform lower body dressing with assistance level of: Independent with assistive device   Problem: RH Toileting Goal: LTG Patient will perform toileting task (3/3 steps) with assistance level (OT) Description: LTG: Patient will perform toileting task (3/3 steps) with assistance level (OT)  Flowsheets (Taken 08/23/2022 1626) LTG: Pt will perform toileting task (3/3 steps) with assistance level: Independent with assistive device   Problem: RH Simple Meal Prep Goal: LTG Patient will perform simple meal prep w/assist (OT) Description: LTG: Patient will  perform simple meal prep with assistance, with/without cues (OT). Flowsheets (Taken 08/23/2022 1626) LTG: Pt will perform simple meal prep with assistance level of: Supervision/Verbal cueing LTG: Pt will perform simple meal prep w/level of: Wheelchair level   Problem: RH Light Housekeeping Goal: LTG Patient will perform light housekeeping w/assist (OT) Description: LTG: Patient will perform light housekeeping with assistance, with/without cues (OT). Flowsheets (Taken 08/23/2022 1626) LTG: Pt will perform light housekeeping with assistance level of: Supervision/Verbal cueing LTG: Pt will perform light housekeeping w/level of: Wheelchair level   Problem: RH Toilet Transfers Goal: LTG Patient will perform toilet transfers w/assist (OT) Description: LTG: Patient will perform toilet transfers with assist, with/without cues using equipment (OT) Flowsheets (Taken 08/23/2022 1626) LTG: Pt will perform toilet transfers with assistance level of: Independent with assistive device   Problem: RH Tub/Shower Transfers Goal: LTG Patient will perform tub/shower transfers w/assist (OT) Description: LTG: Patient will perform tub/shower transfers with assist, with/without cues using equipment (OT) Flowsheets (Taken 08/23/2022 1626) LTG: Pt will perform tub/shower stall transfers with assistance level of: Independent with assistive device LTG: Pt will perform tub/shower transfers from: Tub/shower combination

## 2022-08-23 NOTE — Progress Notes (Signed)
[facility] Individual Statement of Services  Patient Name:  Caleb [name]  Date:  08/23/2022  Welcome to the [facility].  Our goal is to provide you with an individualized program based on your diagnosis and situation, designed to meet your specific needs.  With this comprehensive rehabilitation program, you will be expected to participate in at least 3 hours of rehabilitation therapies Monday-Friday, with modified therapy programming on the weekends.  Your rehabilitation program will include the following services:  Physical Therapy (PT), Occupational Therapy (OT), 24 hour per day rehabilitation nursing, Therapeutic Recreaction (TR), Care Coordinator, Rehabilitation Medicine, Nutrition Services, and Pharmacy Services  Weekly team conferences will be held on Tuesday to discuss your progress.  Your Inpatient Rehabilitation Care Coordinator will talk with you frequently to get your input and to update you on team discussions.  Team conferences with you and your family in attendance may also be held.  Expected length of stay: 10 days  Overall anticipated outcome: mod/I-supervision level  Depending on your progress and recovery, your program may change. Your Inpatient Rehabilitation Care Coordinator will coordinate services and will keep you informed of any changes. Your Inpatient Rehabilitation Care Coordinator's name and contact numbers are listed  below.  The following services may also be recommended but are not provided by the Inpatient [facility]:  Driving Evaluations Home Health Rehabiltiation Services Outpatient Rehabilitation Services    Arrangements will be made to provide these services after discharge if needed.  Arrangements include referral to agencies that provide these services.  Your insurance has been verified to be:  [employer] and [employer] Your primary doctor is:  Hoy Register  Pertinent information will be shared with  your doctor and your insurance company.  Inpatient Rehabilitation Care Coordinator:  Dossie Der, [place] [phone] or Luna Glasgow  Information discussed with and copy given to patient by: Lucy Chris, 08/23/2022, 9:56 AM

## 2022-08-24 DIAGNOSIS — S88111A Complete traumatic amputation at level between knee and ankle, right lower leg, initial encounter: Secondary | ICD-10-CM | POA: Diagnosis not present

## 2022-08-24 LAB — GLUCOSE, CAPILLARY
Glucose-Capillary: 153 mg/dL — ABNORMAL HIGH (ref 70–99)
Glucose-Capillary: 169 mg/dL — ABNORMAL HIGH (ref 70–99)
Glucose-Capillary: 176 mg/dL — ABNORMAL HIGH (ref 70–99)
Glucose-Capillary: 177 mg/dL — ABNORMAL HIGH (ref 70–99)

## 2022-08-24 MED ORDER — HYDROMORPHONE HCL 2 MG PO TABS
2.0000 mg | ORAL_TABLET | ORAL | Status: DC | PRN
  Administered 2022-08-24 – 2022-09-01 (×39): 2 mg via ORAL
  Filled 2022-08-24 (×40): qty 1

## 2022-08-24 MED ORDER — SORBITOL 70 % SOLN
30.0000 mL | Freq: Once | Status: AC
  Administered 2022-08-24: 30 mL via ORAL
  Filled 2022-08-24: qty 30

## 2022-08-24 NOTE — Progress Notes (Signed)
Physical Therapy Session Note  Patient Details  Name: Caleb [name] MRN: [id number] Date of Birth: 1951/01/03  Today's Date: 08/24/2022 PT Individual Time: 4098-1191 PT Individual Time Calculation (min): 57 min   Short Term Goals: Week 1:  PT Short Term Goal 1 (Week 1): STG=LTG due to LOS  Skilled Therapeutic Interventions/Progress Updates:      Therapy Documentation Precautions:  Precautions Precautions: Fall Precaution Comments: R BKA with wound vac Required Braces or Orthoses: Other Brace Other Brace: limb protector Restrictions Weight Bearing Restrictions: Yes RLE Weight Bearing: Non weight bearing   Pt received semi-reclined in bed and with 7/10 phantom limb pain. PT educated pt on phantom limb pain management and performed sensory desensitization and mirror therapy (ankle pumps x 30 and SLR's 3 x 10) and pt reported decrease in pain. PT also provided education on shrinker management, limb guard, limb shaping, prosthesis preparation and amputee support group. Pt left semi-reclined in bed with all needs in reach.    Therapy/Group: Individual Therapy  Truitt Leep Truitt Leep PT, DPT  08/24/2022, 7:46 AM

## 2022-08-24 NOTE — Progress Notes (Signed)
Occupational Therapy Session Note  Patient Details  Name: Caleb [name] MRN: [id number] Date of Birth: [birth year]  Today's Date: 08/24/2022 OT Individual Time: 1125-1200 OT Individual Time Calculation (min): 35 min  and Today's Date: 08/24/2022 OT Missed Time: 10 Minutes Missed Time Reason: Other (comment) (Therapist running behind from previous session.)   Short Term Goals: Week 1:  OT Short Term Goal 1 (Week 1): LTG=STG d/t Pt ELOS  Skilled Therapeutic Interventions/Progress Updates:  Pt received resting in bed for skilled OT session with focus on UE strengthening. Pt agreeable to interventions, demonstrating overall pleasant mood. Pt reported 7-8/10 phantom limb-pain. OT offering intermediate rest breaks and positioning suggestions throughout session to address pain/fatigue and maximize participation/safety in session.   Pt with increased pain this session, limited participation. Pt comes to EOB with supervision. Pt receptive to UE exercises at EOB with 5# dowel bar. Pt performs x10 reps of follow exercises: -Chest press -Bicep curls -Overhead press -Wrist curls  Pt able to scoot self towards [facility] with supervision + cuing.   Pt remained resting in bed with all immediate needs met at end of session. Pt continues to be appropriate for skilled OT intervention to promote further functional independence.   Therapy Documentation Precautions:  Precautions Precautions: Fall Precaution Comments: R BKA with wound vac Required Braces or Orthoses: Other Brace Other Brace: limb protector Restrictions Weight Bearing Restrictions: Yes RLE Weight Bearing: Non weight bearing   Therapy/Group: Individual Therapy  Lou Cal, OTR/L, MSOT  08/24/2022, 6:36 AM

## 2022-08-24 NOTE — Progress Notes (Signed)
Occupational Therapy Session Note  Patient Details  Name: Caleb [name] MRN: [id number] Date of Birth: 13-Jul-1950  Today's Date: 08/24/2022 OT Individual Time: 1302-1330 OT Individual Time Calculation (min): 28 min    Short Term Goals: Week 1:  OT Short Term Goal 1 (Week 1): LTG=STG d/t Pt ELOS  Skilled Therapeutic Interventions/Progress Updates:   OT arrived to pt's room, pt bed level listening to music and completing lunch meal. Pt reported pain in residual limb but open to OT introduction of mirror therapy for relief pf phantom limb pain in R residual limb which pt described. Pt new to rehab and unfamiliar with concept. OT retrieved mirror therapy tool and provided basic information and training via rationale and consistency with use. OT utilized a video for education with a new amputee demonstrating the concept and the effectiveness. Pt had PT session following OT thus OT left materials for carryover of actual performance for PT to continue. OT encouraged pt to continue to direct care surrounding needs and left pt bed level with pt reporting feeling encouraged.   Pain:   8/10 pain reported in R residual limb, addressed intro to mirror therapy with distraction to current pain after nursing provided meds prior to session    Therapy Documentation Precautions:  Precautions Precautions: Fall Precaution Comments: R BKA with wound vac Required Braces or Orthoses: Other Brace Other Brace: limb protector Restrictions Weight Bearing Restrictions: Yes RLE Weight Bearing: Non weight bearing    Therapy/Group: Individual Therapy  Vicenta Dunning 08/24/2022, 12:07 PM

## 2022-08-24 NOTE — Progress Notes (Signed)
Physical Therapy Session Note  Patient Details  Name: Caleb [name] MRN: [id number] Date of Birth: [birth year]  Today's Date: 08/24/2022 PT Individual Time: 0808-0905 PT Individual Time Calculation (min): 57 min   Short Term Goals: Week 1:  PT Short Term Goal 1 (Week 1): STG=LTG due to LOS  Skilled Therapeutic Interventions/Progress Updates: Pt presented in bed agreeable to therapy. Pt states did not have good night as had un retractable pain and got behind on pain meds. LPN entered room with am meds (including pain meds). LPN disconnected wound vac (discovered that wound vac had been unplugged) to allow PTA to thread tubing through shorts. Pt was able to doff limb guard in bed with supervision and increased time.  After receiving pain meds pt performed bed mobility with use of bed features and supervision. At EOB performed lateral leans to pull pants over hips however was unable to complete due to increase in pain due to residual limb being in dependent position. Pt returned to supine and was able to perform partial bridge to complete pulling pants over hips. Due to increased pain and limited access to unplugged wound vac pt spent remainder of session in bed level. While allowing time for pain to resolve from dependent position PTA provided education regarding use of wound vac, use and progression to shrinker, and importance of residual limb exercises for progression to prosthetic. Pt verbalized understanding. Pt also expressed significant phantom limb pain which PTA provided education on techniques which may help in addition to meds (mirror therapy). PTA then noted paper with words "piriformis syndrome",  with questioning pt indicated that MD had written it due to pt having pain at buttocks overnight. Pt was able to turn to sidelying and pt with TTP at piriformis. PTA performed cross friction massage and trigger point release with pt indicating improvement in pain. As pt in sidelying performed hip abd 2  x 10 and hip extension 2 x 10 followed by hip flexor stretch 2 x 1 min ea. Pt then returned to supine and performed SLR 2 x 10 with emphasis on knee extension. Pt was also able to perform KTC progressing into piriformis stretch. Pt was also able to boost self towards HOB using BUE and LLE. HOB then elevated and pt donned limb guard with increased time. Pt repositioned to comfort and left in bed at end of session with bed alarm on, call bell within reach and needs met.   5/10 increased to 8/10 with mobiltiy     Therapy Documentation Precautions:  Precautions Precautions: Fall Precaution Comments: R BKA with wound vac Required Braces or Orthoses: Other Brace Other Brace: limb protector Restrictions Weight Bearing Restrictions: Yes RLE Weight Bearing: Non weight bearing General:   Vital Signs: Therapy Vitals Temp: 98.2 F (36.8 C) Temp Source: Oral Pulse Rate: 90 Resp: 18 BP: 115/72 Oxygen Therapy SpO2: 98 % O2 Device: Room Air Pain: Pain Assessment Pain Scale: 0-10 Pain Score: 5    Therapy/Group: Individual Therapy  Rosellen Lichtenberger 08/24/2022, 9:47 PM

## 2022-08-24 NOTE — Progress Notes (Signed)
PROGRESS NOTE   Subjective/Complaints:  Pt reports "weird' pain in R buttock- tight, sharp spasms in middle of R buttock- More phantom sensation than pain.  Got behind in pain meds last night-  Small BM yesterday but feels constipated.   Gets nauseated with Oxy- Norco just not doing enough.    Got IV iron yesterday.   ROS:  Pt denies SOB, abd pain, CP, N/V/C/D, and vision changes Except for HPI Objective:   No results found. Recent Labs    08/22/22 0843 08/23/22 0606  WBC 10.6* 10.0  HGB 8.5* 7.9*  HCT 26.3* 24.9*  PLT 557* 559*   Recent Labs    08/23/22 0606  NA 131*  K 4.7  CL 97*  CO2 25  GLUCOSE 156*  BUN 35*  CREATININE 1.14  CALCIUM 8.5*    Intake/Output Summary (Last 24 hours) at 08/24/2022 0907 Last data filed at 08/24/2022 0816 Gross per 24 hour  Intake 594 ml  Output 1350 ml  Net -756 ml        Physical Exam: Vital Signs Blood pressure 132/85, pulse 98, temperature 98.1 F (36.7 C), temperature source Oral, resp. rate 18, height 5' 6.93" (1.7 m), weight 79.4 kg, SpO2 99 %.    General: awake, alert, appropriate, sitting up in bed; NAD HENT: conjugate gaze; oropharynx moist CV: regular rhythm; borderline tachycardic rate; no JVD Pulmonary: CTA B/L; no W/R/R- good air movement GI: soft, NT, ND, (+)BS- hypoactive Psychiatric: appropriate Neurological: Ox3 MS: TTP over R piriformis muscle Musculoskeletal:        General: Normal range of motion.     Left lower leg: No edema.     Comments: Wound VAC in place with good seal -nothing in cannister- has L BKA in shrinker with limb guard on it Skin:    General: Skin is warm and dry.  Neurological:     General: No focal deficit present.     Mental Status: He is alert and oriented to person, place, and time.  Psychiatric:        Mood and Affect: Mood normal.        Behavior: Behavior normal.   Motor 4/5 Bilateral grip 5/5 in bialteral  delt , Bi, Tri, Hip flexors left knee ext, 3- Left ankle DF PF Sensation absent to LT below the midcalf  Left foot and bilateral hand intrinsice atrophy  MSK- Left foot with 4th and 5th toe amps well healed   Assessment/Plan: 1. Functional deficits which require 3+ hours per day of interdisciplinary therapy in a comprehensive inpatient rehab setting. Physiatrist is providing close team supervision and 24 hour management of active medical problems listed below. Physiatrist and rehab team continue to assess barriers to discharge/monitor patient progress toward functional and medical goals  Care Tool:  Bathing    Body parts bathed by patient: Right arm, Left arm, Chest, Abdomen, Front perineal area, Buttocks, Face, Left lower leg, Left upper leg, Right upper leg     Body parts n/a: Right lower leg   Bathing assist Assist Level: Contact Guard/Touching assist     Upper Body Dressing/Undressing Upper body dressing   What is the patient wearing?: Pull over shirt  Upper body assist Assist Level: Supervision/Verbal cueing    Lower Body Dressing/Undressing Lower body dressing      What is the patient wearing?: Pants, Underwear/pull up     Lower body assist Assist for lower body dressing: Moderate Assistance - Patient 50 - 74%     Toileting Toileting    Toileting assist Assist for toileting: Minimal Assistance - Patient > 75%     Transfers Chair/bed transfer  Transfers assist     Chair/bed transfer assist level: Minimal Assistance - Patient > 75%     Locomotion Ambulation   Ambulation assist      Assist level: Minimal Assistance - Patient > 75% Assistive device: Walker-rolling Max distance: 38ft   Walk 10 feet activity   Assist     Assist level: Minimal Assistance - Patient > 75% Assistive device: Walker-rolling   Walk 50 feet activity   Assist Walk 50 feet with 2 turns activity did not occur: Safety/medical concerns (pain)         Walk 150  feet activity   Assist Walk 150 feet activity did not occur: Safety/medical concerns (pain)         Walk 10 feet on uneven surface  activity   Assist Walk 10 feet on uneven surfaces activity did not occur: Safety/medical concerns (pain)         Wheelchair     Assist Is the patient using a wheelchair?: Yes Type of Wheelchair: Manual    Wheelchair assist level: Supervision/Verbal cueing Max wheelchair distance: 115ft    Wheelchair 50 feet with 2 turns activity    Assist        Assist Level: Supervision/Verbal cueing   Wheelchair 150 feet activity     Assist      Assist Level: Supervision/Verbal cueing   Blood pressure 132/85, pulse 98, temperature 98.1 F (36.7 C), temperature source Oral, resp. rate 18, height 5' 6.93" (1.7 m), weight 79.4 kg, SpO2 99 %.  Medical Problem List and Plan: 1. Functional deficits secondary to Right BKA secondary to osteomyelitis RIght foot             -patient may  shower once wound vac is removed             -ELOS/Goals: 7-10d Sup/Mod I goals   Con't CIR PT and OT -d/c ~ 2 weeks 2.  Antithrombotics: -DVT/anticoagulation:  Pharmaceutical: Eliquis             -antiplatelet therapy: aspirin 81 mg daily   3. Pain Management: Tylenol as needed             -continue gabapentin 300 mg TID   6/4- on Norco as needed q4 hours prn for pain- is working so far  6/5- piriformis syndrome- will start stretching and tennis ball usage; Also change Norco to Dilaudid since don't want to increase Norco due to tylenol- and cannot tolerate Oxy- 2 mg q4 hours prn 4. Mood/Behavior/Sleep: LCSW to evaluate and provide emotional support             -antipsychotic agents: n/a   5. Neuropsych/cognition: This patient is capable of making decisions on his own behalf.   6. Skin/Wound Care: Routine skin care checks             -remove Prevena in one week   7. Fluids/Electrolytes/Nutrition: Routine Is and Os and follow-up chemistries              -continue vitamin C, Nepro, Juven, zinc  8: Hypertension: monitor TID and prn; Lasix and hydralazine held due to soft BP             -continue carvedilol 3.125 mg BID             -continue Imdur 15 mg daily   6/4- BP controlled currently- con't regimen 9: Hyperlipidemia: continue statin   10: DM-2: A1c = 7.9%; CBGs QID; home regimen NPH 70/30 8 units before breakfast and 7 units before supper not restarted             -continue SSI   6/4- Will restart insulin once CBGs a little higher- right now mainly mid 100s- with 1 value 211- I'm concerned about starting insulin at thiese levels- don't want hypoglycemia 11: HFrEF: current EF ~25-30%; Lasix and hydralazine on hold             -daily weight , monitor for fluid overload             -continue Imdur 15 mg daily             -continue Carvedilol 3.125 mg BID   6/4- Weight down 1 kg- will monitor daily-   6/5- no weight so far today- will d/w nursing 12: Aortic stenosis s/p TAVR 12/2021   13: History of CAD: Continue statin, aspirin, Eliquis             -follows with Dr. Allyson Sabal   14: Anemia of chronic disease/ABLA: follow-up CBC   6/4- Hb down to 7.9 from 8.5- wil recheck Thursday 15: CKD stage 3a: currently Scr at baseline>>1.28; follow-up BMP   6/4- Cr down to 1.14 from 1.28 and Cr 35 up from 31- will recheck Thursday  16: PAD: continue statin, aspirin, Eliquis   17: Hyponatremia; chronic: Lasix held; follow-up BMP   6/4- Na 131- on high side for him- will monitor 18: Leukocytosis: no fever or known infection; follow-up CBC with diff   6/4- WBC down to normal levels- 10k- will monitor 19: Thrombocytosis: upward trend; follow-up CBC   6/4- Plts stable at 59k 20: GERD: continue Protonix   21: Elevated transaminases: improved; follow-up CMP   AST very slightly elevated at 42- but otherwise resolved 22: Diabetic neuropathy: continue gabapentin 300 mg TID 23. Constipation  6/5- will give Sorbitol 30cc since no BM in 3 days except  very small one last night  I spent a total of 39   minutes on total care today- >50% coordination of care- due to d/w pt about piriformis syndrome and use for tennis ball; changing pain meds to Dilaudid from norco- cannot tolerate Oxy- due to N/V- also VAC off Friday.     LOS: 2 days A FACE TO FACE EVALUATION WAS PERFORMED  Shaley Leavens 08/24/2022, 9:07 AM

## 2022-08-25 ENCOUNTER — Encounter (HOSPITAL_COMMUNITY): Payer: Self-pay | Admitting: Orthopedic Surgery

## 2022-08-25 DIAGNOSIS — S88111A Complete traumatic amputation at level between knee and ankle, right lower leg, initial encounter: Secondary | ICD-10-CM | POA: Diagnosis not present

## 2022-08-25 LAB — BASIC METABOLIC PANEL
Anion gap: 9 (ref 5–15)
BUN: 40 mg/dL — ABNORMAL HIGH (ref 8–23)
CO2: 24 mmol/L (ref 22–32)
Calcium: 8.7 mg/dL — ABNORMAL LOW (ref 8.9–10.3)
Chloride: 99 mmol/L (ref 98–111)
Creatinine, Ser: 1.19 mg/dL (ref 0.61–1.24)
GFR, Estimated: >60 mL/min (ref >60)
Glucose, Bld: 213 mg/dL — ABNORMAL HIGH (ref 70–99)
Potassium: 4.3 mmol/L (ref 3.5–5.1)
Sodium: 132 mmol/L — ABNORMAL LOW (ref 135–145)

## 2022-08-25 LAB — CBC WITH DIFFERENTIAL/PLATELET
Abs Immature Granulocytes: 0.08 10*3/uL — ABNORMAL HIGH (ref 0.00–0.07)
Basophils Absolute: 0.1 10*3/uL (ref 0.0–0.1)
Basophils Relative: 1 %
Eosinophils Absolute: 0.6 10*3/uL — ABNORMAL HIGH (ref 0.0–0.5)
Eosinophils Relative: 6 %
HCT: 28.3 % — ABNORMAL LOW (ref 39.0–52.0)
Hemoglobin: 8.9 g/dL — ABNORMAL LOW (ref 13.0–17.0)
Immature Granulocytes: 1 %
Lymphocytes Relative: 19 %
Lymphs Abs: 1.9 10*3/uL (ref 0.7–4.0)
MCH: 26.3 pg (ref 26.0–34.0)
MCHC: 31.4 g/dL (ref 30.0–36.0)
MCV: 83.7 fL (ref 80.0–100.0)
Monocytes Absolute: 0.9 10*3/uL (ref 0.1–1.0)
Monocytes Relative: 9 %
Neutro Abs: 6.2 10*3/uL (ref 1.7–7.7)
Neutrophils Relative %: 64 %
Platelets: 597 10*3/uL — ABNORMAL HIGH (ref 150–400)
RBC: 3.38 MIL/uL — ABNORMAL LOW (ref 4.22–5.81)
RDW: 16.8 % — ABNORMAL HIGH (ref 11.5–15.5)
WBC: 9.7 10*3/uL (ref 4.0–10.5)
nRBC: 0 % (ref 0.0–0.2)

## 2022-08-25 LAB — GLUCOSE, CAPILLARY
Glucose-Capillary: 184 mg/dL — ABNORMAL HIGH (ref 70–99)
Glucose-Capillary: 212 mg/dL — ABNORMAL HIGH (ref 70–99)
Glucose-Capillary: 162 mg/dL — ABNORMAL HIGH (ref 70–99)
Glucose-Capillary: 191 mg/dL — ABNORMAL HIGH (ref 70–99)

## 2022-08-25 MED ORDER — INSULIN ASPART PROT & ASPART (70-30 MIX) 100 UNIT/ML ~~LOC~~ SUSP
4.0000 [IU] | Freq: Two times a day (BID) | SUBCUTANEOUS | Status: DC
  Administered 2022-08-25 – 2022-08-26 (×2): 4 [IU] via SUBCUTANEOUS
  Filled 2022-08-25: qty 10

## 2022-08-25 NOTE — Progress Notes (Signed)
Physical Therapy Session Note  Patient Details  Name: Caleb [name] MRN: [id number] Date of Birth: 1951/02/21  Today's Date: 08/25/2022 PT Individual Time: 0806-0900 PT Individual Time Calculation (min): 54 min   Short Term Goals: Week 1:  PT Short Term Goal 1 (Week 1): STG=LTG due to LOS  Skilled Therapeutic Interventions/Progress Updates: Pt presented in bed agreeable to therapy. Pt c/o pain 5/10 in residual limb primarily phantom pain, PRN meds received less then 1 hour earlier and RN arrived during session to provide scheduled meds. PTA obtained disposable scrubs and PTA threaded wound vac then assisted pt threading pants in supine. Pt completed pull pants over hips performing partial bridge. Pt then performed supine to sit EOB with supervision, increased time and use of bed features. Bed slightly elevated and pt performed Sit to stand with CGA with significant increased effort noted. Completed stand pivot transfer with pt taking small "double hops" (staying within RW) to turn to chair. Pt noted on this instance to have poor foot clearance and require significant increased effort to complete. Pt noted pain in residual limb significantly increased to 10/10 however once in w/c with amputee pad placed resolved within a few minutes however phantom limb pain still present. Pt was able to propel with increased time to day room and supervision. At [facility] pt participated in Kinetron x 5 min 3 min at 30cm/sec and 2 min increased workload 20 cm/sec for conditioning of LLE to tolerate standing activities. Pt the propelled back to room in same manner as prior and performed squat pivot transfer to bed due to pain with CGA. Pt was able to transfer to supine with supervision and repositioned to comfort. Pt left in bed at end of session with bed alarm on, call bell within reach and needs met.      Therapy Documentation Precautions:  Precautions Precautions: Fall Precaution Comments: R BKA with wound  vac Required Braces or Orthoses: Other Brace Other Brace: limb protector Restrictions Weight Bearing Restrictions: Yes RLE Weight Bearing: Non weight bearing General:   Vital Signs:   Pain: Pain Assessment Pain Score: 6     Therapy/Group: Individual Therapy  Armelia Penton 08/25/2022, 10:00 AM

## 2022-08-25 NOTE — Progress Notes (Signed)
Occupational Therapy Session Note  Patient Details  Name: Caleb [name] MRN: [id number] Date of Birth: 05/15/1950  Today's Date: 08/25/2022 OT Individual Time: 1445-1530 OT Individual Time Calculation (min): 45 min    Short Term Goals: Week 1:  OT Short Term Goal 1 (Week 1): LTG=STG d/t Pt ELOS  Skilled Therapeutic Interventions/Progress Updates:   Pt seen for skilled OT this pm with orientee OT present as well. Pt reported high pain and OT focussed on most of session. See below. OT began with gathering DME needs in preparation for discharge. Secure chat and equipment documented for d/c planning for recommendation for TTB, DABSC, reacher, suction grab bars as pt will be initially staying with family then his apt and then eventually moving more south to [facility] as per report. Pt remianed bed level with all needs, bed alarm engaged and nurse call button in reach. See below for rest of session activity.    Pain/Interventions: Pt reports "54/55" pain upon OT arrival. Meds have been given and not due. OT focussed on distraction, cool light towel in plastic bag with pillow case skin protector over R residual limb 10 min, mirror therapy 10 minutes with legitimate pain rating of 6 03/22/08 at end of session with pt smiling and reporting thankful for session.   Therapy Documentation Precautions:  Precautions Precautions: Fall Precaution Comments: R BKA with wound vac Required Braces or Orthoses: Other Brace Other Brace: limb protector Restrictions Weight Bearing Restrictions: Yes RLE Weight Bearing: Non weight bearing    Therapy/Group: Individual Therapy  Vicenta Dunning 08/25/2022, 8:01 AM

## 2022-08-25 NOTE — IPOC Note (Signed)
Overall Plan of Care [facility]) Patient Details Name: Caleb [name] MRN: [id number] DOB: [birth year]  Admitting Diagnosis: Unilateral complete BKA, right, initial encounter [facility])  Hospital Problems: Principal Problem:   Unilateral complete BKA, right, initial encounter [facility])     Functional Problem List: Nursing Skin Integrity, Bowel, Pain, Edema, Endurance, Safety, Medication Management, Sensory  PT Balance, Edema, Endurance, Motor, Nutrition, Pain, Sensory, Skin Integrity  OT Balance, Safety, Sensory, Skin Integrity, Endurance, Motor, Pain, Edema  SLP    TR         Basic ADL's: OT Bathing, Dressing, Toileting     Advanced  ADL's: OT Simple Meal Preparation     Transfers: PT Bed to Chair, Bed Mobility, Car, Lobbyist, Technical brewer: PT Ambulation, Psychologist, prison and probation services, Stairs     Additional Impairments: OT None  SLP        TR      Anticipated Outcomes Item Anticipated Outcome  Self Feeding Mod I  Swallowing      Basic self-care  Mod I  Toileting  Mod I   Bathroom Transfers Mod I-Supervision  Bowel/Bladder  Continent B/B  Transfers  Mod I with LRAD  Locomotion  supervision with LRAD  Communication     Cognition     Pain  less than 4  Safety/Judgment  fall free   Therapy Plan: PT Intensity: Minimum of 1-2 x/day ,45 to 90 minutes PT Frequency: 5 out of 7 days PT Duration Estimated Length of Stay: 10 days OT Intensity: Minimum of 1-2 x/day, 45 to 90 minutes OT Frequency: 5 out of 7 days OT Duration/Estimated Length of Stay: 7-10 days     Team Interventions: Nursing Interventions Patient/Family Education, Disease Management/Prevention, Discharge Planning, Skin Care/Wound Management, Pain Management, Psychosocial Support, Bowel Management, Medication Management  PT interventions Ambulation/gait training, Discharge planning, Functional mobility training, Psychosocial support, Therapeutic Activities, Visual/perceptual  remediation/compensation, Balance/vestibular training, Disease management/prevention, Neuromuscular re-education, Skin care/wound management, Therapeutic Exercise, Wheelchair propulsion/positioning, DME/adaptive equipment instruction, Pain management, Splinting/orthotics, UE/LE Strength taining/ROM, Firefighter, Equities trader education, Museum/gallery curator, UE/LE Coordination activities  OT Interventions Warden/ranger, Discharge planning, Pain management, Self Care/advanced ADL retraining, Therapeutic Activities, UE/LE Coordination activities, Disease mangement/prevention, Functional mobility training, Patient/family education, Skin care/wound managment, Therapeutic Exercise, Visual/perceptual remediation/compensation, Firefighter, Fish farm manager, Neuromuscular re-education, Psychosocial support, Splinting/orthotics, UE/LE Strength taining/ROM, Wheelchair propulsion/positioning  SLP Interventions    TR Interventions    SW/CM Interventions Discharge Planning, Psychosocial Support, Patient/Family Education   Barriers to Discharge MD  Medical stability, Home enviroment access/loayout, Wound care, Lack of/limited family support, and Weight bearing restrictions  Nursing Decreased caregiver support, Home environment access/layout, Wound Care, Lack of/limited family support, Insurance for SNF coverage, Weight bearing restrictions Lives alone 1 level with 4 stepes no rails  PT Decreased caregiver support, Wound Care, Lack of/limited family support, Weight bearing restrictions, Other (comments) pain, lives alone  OT      SLP      SW Decreased caregiver support     Team Discharge Planning: Destination: PT-Home ,OT- Home , SLP-  Projected Follow-up: PT-Home health PT, OT-  None, SLP-  Projected Equipment Needs: PT-To be determined, OT- To be determined, SLP-  Equipment Details: PT-has RW and WC. Will need R amputee support pad, OT-  Patient/family  involved in discharge planning: PT- Patient,  OT-Patient, SLP-   MD ELOS: 7-10 days Medical Rehab Prognosis:  Good Assessment: The patient has been admitted for CIR therapies with the diagnosis of R BKA. The  team will be addressing functional mobility, strength, stamina, balance, safety, adaptive techniques and equipment, self-care, bowel and bladder mgt, patient and caregiver education, preprosthetic training. Goals have been set at mod I to supervision. Anticipated discharge destination is home.        See Team Conference Notes for weekly updates to the plan of care

## 2022-08-25 NOTE — Progress Notes (Signed)
Physical Therapy Session Note  Patient Details  Name: Caleb [name] MRN: [id number] Date of Birth: 1951/02/10  Today's Date: 08/25/2022 PT Individual Time: 1015-1059 PT Individual Time Calculation (min): 44 min   Short Term Goals: Week 1:  PT Short Term Goal 1 (Week 1): STG=LTG due to LOS  Skilled Therapeutic Interventions/Progress Updates:      Pt supine in bed upon arrival with lab tech in room for bloodwork. Pt agreeable to therapy. Pt reports 8/10 R LE phantom pain in foot, heel and toes; appears to increase with dependent positioning, decreased to 7/10 with mirror therapy and densensitization strategies.    Pt performed supine to sit with supervision and use of bed rail, verbal cues provided for technique. Pt performed stand pivot transfer with RW and Min A for power up and CGA for pivot, verbal cues provided for technique and handplacement.   Pt performed mirror therapy while performing 1x15 L LE LAQ, marching, ankle pumps,  and quad sets.   Pt donned and doffed limb protector with mod A 2/2 pt neuropathy in L UE. Pt performed desensitization strategies on residual limb with therapist providing demonstration, and verbal cuing. Education provided to perform regularly in bed to reduce phantom pain.   Nurse requesting pt to be in bed at end of session for wound vac removal. Pt performed stand pivot transfer with RW and light min A for power up and CGA for pivot with same verbal cues as described above. Pt requesting to use urinal. Pt continent of bladder (documented in flowsheets). Pt supine  in bed at end of session with all needs within reach and bed alarm on.   Therapy Documentation Precautions:  Precautions Precautions: Fall Precaution Comments: R BKA with wound vac Required Braces or Orthoses: Other Brace Other Brace: limb protector Restrictions Weight Bearing Restrictions: Yes RLE Weight Bearing: Non weight bearing Therapy/Group: Individual Therapy  [facility] [place]rose [place], [place], DPT  08/25/2022, 7:33 AM

## 2022-08-25 NOTE — Progress Notes (Signed)
Physical Therapy Session Note  Patient Details  Name: MARICUS RICCIARDELLI MRN: [id number] Date of Birth: [birth year]  Today's Date: 08/25/2022 PT Missed Time: 60 Minutes Missed Time Reason: Pain  Short Term Goals: Week 1:  PT Short Term Goal 1 (Week 1): STG=LTG due to LOS  Skilled Therapeutic Interventions/Progress Updates:      Therapy Documentation Precautions:  Precautions Precautions: Fall Precaution Comments: R BKA with wound vac Required Braces or Orthoses: Other Brace Other Brace: limb protector Restrictions Weight Bearing Restrictions: Yes RLE Weight Bearing: Non weight bearing   Pt missed 60 minutes of skilled PT due to pain, plan to make up missed minutes as able and nursing notified and provided patient with pain medication.    Therapy/Group: Individual Therapy  Truitt Leep Truitt Leep PT, DPT  08/25/2022, 7:45 AM

## 2022-08-25 NOTE — Progress Notes (Addendum)
PROGRESS NOTE   Subjective/Complaints:  Piriformis pain a little better- didn't get tennis ball or piriformis stretches yet.  Will d/w therapy Also having new pain in R distal calf- on BKA side- near VAC/incision that's like an icepick, sharp stabbing- intermittent, but frequent-  Also saw that [facility] wasn't on correctly- restarted it.   Also having R forearm tenderness- in localized spot Got cleaned out yesterday of stool.  New medicine more effective Did mirror therapy with PT- worked pretty well.    ROS:   Pt denies SOB, abd pain, CP, N/V/C/D, and vision changes  Except for HPI Objective:   No results found. Recent Labs    08/22/22 0843 08/23/22 0606  WBC 10.6* 10.0  HGB 8.5* 7.9*  HCT 26.3* 24.9*  PLT 557* 559*   Recent Labs    08/23/22 0606  NA 131*  K 4.7  CL 97*  CO2 25  GLUCOSE 156*  BUN 35*  CREATININE 1.14  CALCIUM 8.5*    Intake/Output Summary (Last 24 hours) at 08/25/2022 0826 Last data filed at 08/25/2022 0449 Gross per 24 hour  Intake 480 ml  Output 2225 ml  Net -1745 ml        Physical Exam: Vital Signs Blood pressure 110/78, pulse 94, temperature 98.4 F (36.9 C), temperature source Oral, resp. rate 18, height 5' 6.93" (1.7 m), weight 79.4 kg, SpO2 100 %.     General: awake, alert, appropriate, sitting up in bed; wearing shrinker and limb guard- and VAC not on- fixed that; NAD HENT: conjugate gaze; oropharynx moist CV: regular rate and rhythm; no JVD Pulmonary: CTA B/L; no W/R/R- good air movement GI: soft, NT, ND, (+)BS- more normoactive Psychiatric: appropriate Neurological: Ox3 Skin; localized swelling on R forearm- not red, but raised, TTP from IV he had previously.  MS: TTP over R piriformis muscle- still Musculoskeletal:        General: Normal range of motion.     Left lower leg: No edema.     Comments: Wound VAC in place -not on initially- fixed that- grabbing R distal  calf around incision Skin:    General: Skin is warm and dry.  Neurological:     General: No focal deficit present.     Mental Status: He is alert and oriented to person, place, and time.  Psychiatric:        Mood and Affect: Mood normal.        Behavior: Behavior normal.   Motor 4/5 Bilateral grip 5/5 in bialteral delt , Bi, Tri, Hip flexors left knee ext, 3- Left ankle DF PF Sensation absent to LT below the midcalf  Left foot and bilateral hand intrinsice atrophy  MSK- Left foot with 4th and 5th toe amps well healed   Assessment/Plan: 1. Functional deficits which require 3+ hours per day of interdisciplinary therapy in a comprehensive inpatient rehab setting. Physiatrist is providing close team supervision and 24 hour management of active medical problems listed below. Physiatrist and rehab team continue to assess barriers to discharge/monitor patient progress toward functional and medical goals  Care Tool:  Bathing    Body parts bathed by patient: Right arm, Left arm, Chest, Abdomen,  Front perineal area, Buttocks, Face, Left lower leg, Left upper leg, Right upper leg     Body parts n/a: Right lower leg   Bathing assist Assist Level: Contact Guard/Touching assist     Upper Body Dressing/Undressing Upper body dressing   What is the patient wearing?: Pull over shirt    Upper body assist Assist Level: Supervision/Verbal cueing    Lower Body Dressing/Undressing Lower body dressing      What is the patient wearing?: Pants, Underwear/pull up     Lower body assist Assist for lower body dressing: Moderate Assistance - Patient 50 - 74%     Toileting Toileting    Toileting assist Assist for toileting: Minimal Assistance - Patient > 75%     Transfers Chair/bed transfer  Transfers assist     Chair/bed transfer assist level: Minimal Assistance - Patient > 75%     Locomotion Ambulation   Ambulation assist      Assist level: Minimal Assistance - Patient >  75% Assistive device: Walker-rolling Max distance: 15ft   Walk 10 feet activity   Assist     Assist level: Minimal Assistance - Patient > 75% Assistive device: Walker-rolling   Walk 50 feet activity   Assist Walk 50 feet with 2 turns activity did not occur: Safety/medical concerns (pain)         Walk 150 feet activity   Assist Walk 150 feet activity did not occur: Safety/medical concerns (pain)         Walk 10 feet on uneven surface  activity   Assist Walk 10 feet on uneven surfaces activity did not occur: Safety/medical concerns (pain)         Wheelchair     Assist Is the patient using a wheelchair?: Yes Type of Wheelchair: Manual    Wheelchair assist level: Supervision/Verbal cueing Max wheelchair distance: 148ft    Wheelchair 50 feet with 2 turns activity    Assist        Assist Level: Supervision/Verbal cueing   Wheelchair 150 feet activity     Assist      Assist Level: Supervision/Verbal cueing   Blood pressure 110/78, pulse 94, temperature 98.4 F (36.9 C), temperature source Oral, resp. rate 18, height 5' 6.93" (1.7 m), weight 79.4 kg, SpO2 100 %.  Medical Problem List and Plan: 1. Functional deficits secondary to Right BKA secondary to osteomyelitis RIght foot             -patient may  shower once wound vac is removed             -ELOS/Goals: 7-10d Sup/Mod I goals   Con't CIR PT and OT- ~ 2 weeks  IPOC done 2.  Antithrombotics: -DVT/anticoagulation:  Pharmaceutical: Eliquis             -antiplatelet therapy: aspirin 81 mg daily   3. Pain Management: Tylenol as needed             -continue gabapentin 300 mg TID   6/4- on Norco as needed q4 hours prn for pain- is working so far  6/5- piriformis syndrome- will start stretching and tennis ball usage; Also change Norco to Dilaudid since don't want to increase Norco due to tylenol- and cannot tolerate Oxy- 2 mg q4 hours prn  6/6- will d/w therapy about piriformis  stretches- pain doing better on Dliaudid than Norco- better controlled 4. Mood/Behavior/Sleep: LCSW to evaluate and provide emotional support             -  antipsychotic agents: n/a   5. Neuropsych/cognition: This patient is capable of making decisions on his own behalf.   6. Skin/Wound Care: Routine skin care checks             -remove Prevena in one week   6/6- due to be removed tomorrow- however since having new R distal/near incision pain- will ask Dr Lajoyce Corners if can remove today? 7. Fluids/Electrolytes/Nutrition: Routine Is and Os and follow-up chemistries             -continue vitamin C, Nepro, Juven, zinc   8: Hypertension: monitor TID and prn; Lasix and hydralazine held due to soft BP             -continue carvedilol 3.125 mg BID             -continue Imdur 15 mg daily  6/6- BP well controlled- con't regimen 9: Hyperlipidemia: continue statin   10: DM-2: A1c = 7.9%; CBGs QID; home regimen NPH 70/30 8 units before breakfast and 7 units before supper not restarted             -continue SSI   6/4- Will restart insulin once CBGs a little higher- right now mainly mid 100s- with 1 value 211- I'm concerned about starting insulin at thiese levels- don't want hypoglycemia  6/6- CBGs 153-184 in last 24 hours- will restart 70/30- because he wants home medicine used- 4 units BID-meals and monitor trend 11: HFrEF: current EF ~25-30%; Lasix and hydralazine on hold             -daily weight , monitor for fluid overload             -continue Imdur 15 mg daily             -continue Carvedilol 3.125 mg BID   6/4- Weight down 1 kg- will monitor daily-   6/6- no weight in 2 days-  12: Aortic stenosis s/p TAVR 12/2021   13: History of CAD: Continue statin, aspirin, Eliquis             -follows with Dr. Allyson Sabal   14: Anemia of chronic disease/ABLA: follow-up CBC   6/4- Hb down to 7.9 from 8.5- wil recheck Thursday 15: CKD stage 3a: currently Scr at baseline>>1.28; follow-up BMP   6/4- Cr down to 1.14  from 1.28 and Cr 35 up from 31- will recheck Thursday  16: PAD: continue statin, aspirin, Eliquis   17: Hyponatremia; chronic: Lasix held; follow-up BMP   6/4- Na 131- on high side for him- will monitor  6/6- labs pending 18: Leukocytosis: no fever or known infection; follow-up CBC with diff   6/4- WBC down to normal levels- 10k- will monitor 19: Thrombocytosis: upward trend; follow-up CBC   6/4- Plts stable at 559k 20: GERD: continue Protonix   21: Elevated transaminases: improved; follow-up CMP   AST very slightly elevated at 42- but otherwise resolved 22: Diabetic neuropathy: continue gabapentin 300 mg TID 23. Constipation  6/5- will give Sorbitol 30cc since no BM in 3 days except very small one last night  6/6- had good BM's.  24. R forearm- IV infiltration  6/6- will order warm compresses for R forearm  Addendum- Dr Lajoyce Corners agrees to remove VAC.    I spent a total of 53   minutes on total care today- >50% coordination of care- due to calling Dr Lajoyce Corners about seeing if can remove VAC today- also IPOC and d/w PA about ongoing medical issues  LOS: 3 days A FACE TO FACE EVALUATION WAS PERFORMED  Janalynn Eder 08/25/2022, 8:26 AM

## 2022-08-25 NOTE — Discharge Instructions (Addendum)
Inpatient Rehab Discharge Instructions  Caleb [name] Discharge date and time: 09/05/2022  Activities/Precautions/ Functional Status: Activity: no lifting, driving, or strenuous exercise until cleared by MD Diet: diabetic diet Wound Care: none needed Functional status:  ___ No restrictions     ___ Walk up steps independently ___ 24/7 supervision/assistance   ___ Walk up steps with assistance _x__ Intermittent supervision/assistance  ___ Bathe/dress independently ___ Walk with walker     ___ Bathe/dress with assistance ___ Walk Independently    ___ Shower independently ___ Walk with assistance    ___ Shower with assistance _x__ No alcohol     ___ Return to work/school ________  Special Instructions: No driving, alcohol consumption or tobacco use.  Recommend checking fingerstick blood sugars four (4) times daily and record. Bring this information with you to follow-up appointment with PCP.  Recommend daily BP measurement in same arm and record time of day. Bring this information with you to follow-up appointment with PCP.  COMMUNITY REFERRALS UPON DISCHARGE:    Home Health:   PT  OT  RN                Agency:[facility] [phone]   Medical Equipment/Items Ordered: R-AMPUTEE PAD, DROP-ARM BEDSIDE COMMODE, TUB BENCH AND GRAB BAR FOR SHOWER                                                 Agency[facility]Supplier:[facility]  HAS WHEELCHAIR AND ROLLING WALKER FROM PAST ADMISSION   My questions have been answered and I understand these instructions. I will adhere to these goals and the provided educational materials after my discharge from the hospital.  Patient/Caregiver Signature _______________________________ Date __________  Clinician Signature _______________________________________ Date __________  Please bring this form and your medication list with you to all your follow-up doctor's appointments.

## 2022-08-26 DIAGNOSIS — S88111A Complete traumatic amputation at level between knee and ankle, right lower leg, initial encounter: Secondary | ICD-10-CM | POA: Diagnosis not present

## 2022-08-26 LAB — GLUCOSE, CAPILLARY
Glucose-Capillary: 210 mg/dL — ABNORMAL HIGH (ref 70–99)
Glucose-Capillary: 155 mg/dL — ABNORMAL HIGH (ref 70–99)
Glucose-Capillary: 158 mg/dL — ABNORMAL HIGH (ref 70–99)
Glucose-Capillary: 139 mg/dL — ABNORMAL HIGH (ref 70–99)

## 2022-08-26 MED ORDER — INSULIN ASPART PROT & ASPART (70-30 MIX) 100 UNIT/ML ~~LOC~~ SUSP
5.0000 [IU] | Freq: Two times a day (BID) | SUBCUTANEOUS | Status: DC
  Administered 2022-08-26 – 2022-09-05 (×20): 5 [IU] via SUBCUTANEOUS
  Filled 2022-08-26: qty 10

## 2022-08-26 NOTE — Progress Notes (Signed)
PROGRESS NOTE   Subjective/Complaints: Stump pain last noc , has episodic phantom pain as well, left foot burning pain generally controlled Taking hydromorphone 4-5 x per day (may take up to 6x) Denies constipation   ROS:   Pt denies SOB, abd pain, CP, N/V/C/D  Except for HPI Objective:   No results found. Recent Labs    08/25/22 1018  WBC 9.7  HGB 8.9*  HCT 28.3*  PLT 597*    Recent Labs    08/25/22 1018  NA 132*  K 4.3  CL 99  CO2 24  GLUCOSE 213*  BUN 40*  CREATININE 1.19  CALCIUM 8.7*     Intake/Output Summary (Last 24 hours) at 08/26/2022 0818 Last data filed at 08/26/2022 0700 Gross per 24 hour  Intake 360 ml  Output 2455 ml  Net -2095 ml         Physical Exam: Vital Signs Blood pressure 109/68, pulse 98, temperature 97.8 F (36.6 C), temperature source Oral, resp. rate 18, height 5' 6.93" (1.7 m), weight 79.4 kg, SpO2 98 %.  General: No acute distress Mood and affect are appropriate Heart: Regular rate and rhythm no rubs murmurs or extra sounds Lungs: Clear to auscultation, breathing unlabored, no rales or wheezes Abdomen: Positive bowel sounds, soft nontender to palpation, nondistended   Musculoskeletal:        General: Normal range of motion.  Right limb guard in PT Neurological:     General: No focal deficit present.     Mental Status: He is alert and oriented to person, place, and time.  Psychiatric:        Mood and Affect: Mood normal.        Behavior: Behavior normal.   Motor 4/5 Bilateral grip 5/5 in bialteral delt , Bi, Tri, Hip flexors left knee ext, 3- Left ankle DF PF Sensation absent to LT below the midcalf  Left foot and bilateral hand intrinsice atrophy  MSK- Left foot with 4th and 5th toe amps well healed   Assessment/Plan: 1. Functional deficits which require 3+ hours per day of interdisciplinary therapy in a comprehensive inpatient rehab setting. Physiatrist is  providing close team supervision and 24 hour management of active medical problems listed below. Physiatrist and rehab team continue to assess barriers to discharge/monitor patient progress toward functional and medical goals  Care Tool:  Bathing    Body parts bathed by patient: Right arm, Left arm, Chest, Abdomen, Front perineal area, Buttocks, Face, Left lower leg, Left upper leg, Right upper leg     Body parts n/a: Right lower leg   Bathing assist Assist Level: Contact Guard/Touching assist     Upper Body Dressing/Undressing Upper body dressing   What is the patient wearing?: Pull over shirt    Upper body assist Assist Level: Supervision/Verbal cueing    Lower Body Dressing/Undressing Lower body dressing      What is the patient wearing?: Pants, Underwear/pull up     Lower body assist Assist for lower body dressing: Moderate Assistance - Patient 50 - 74%     Toileting Toileting    Toileting assist Assist for toileting: Minimal Assistance - Patient > 75%  Transfers Chair/bed transfer  Transfers assist     Chair/bed transfer assist level: Minimal Assistance - Patient > 75%     Locomotion Ambulation   Ambulation assist      Assist level: Minimal Assistance - Patient > 75% Assistive device: Walker-rolling Max distance: 24ft   Walk 10 feet activity   Assist     Assist level: Minimal Assistance - Patient > 75% Assistive device: Walker-rolling   Walk 50 feet activity   Assist Walk 50 feet with 2 turns activity did not occur: Safety/medical concerns (pain)         Walk 150 feet activity   Assist Walk 150 feet activity did not occur: Safety/medical concerns (pain)         Walk 10 feet on uneven surface  activity   Assist Walk 10 feet on uneven surfaces activity did not occur: Safety/medical concerns (pain)         Wheelchair     Assist Is the patient using a wheelchair?: Yes Type of Wheelchair: Manual    Wheelchair  assist level: Supervision/Verbal cueing Max wheelchair distance: 110ft    Wheelchair 50 feet with 2 turns activity    Assist        Assist Level: Supervision/Verbal cueing   Wheelchair 150 feet activity     Assist      Assist Level: Supervision/Verbal cueing   Blood pressure 109/68, pulse 98, temperature 97.8 F (36.6 C), temperature source Oral, resp. rate 18, height 5' 6.93" (1.7 m), weight 79.4 kg, SpO2 98 %.  Medical Problem List and Plan: 1. Functional deficits secondary to Right BKA secondary to osteomyelitis RIght foot             -patient may  shower since wound vac is removed             -ELOS/Goals: 7-10d Sup/Mod I goals   Con't CIR PT and OT- ~ 2 weeks  2.  Antithrombotics: -DVT/anticoagulation:  Pharmaceutical: Eliquis             -antiplatelet therapy: aspirin 81 mg daily   3. Pain Management: Tylenol as needed             -continue gabapentin 300 mg TID   6/4- on Norco as needed q4 hours prn for pain- is working so far  6/5- piriformis syndrome- will start stretching and tennis ball usage; Also change Norco to Dilaudid since don't want to increase Norco due to tylenol- and cannot tolerate Oxy- 2 mg q4 hours prn  6/6- will d/w therapy about piriformis stretches- pain doing better on Dliaudid than Norco- better controlled 4. Mood/Behavior/Sleep: LCSW to evaluate and provide emotional support             -antipsychotic agents: n/a   5. Neuropsych/cognition: This patient is capable of making decisions on his own behalf.   6. Skin/Wound Care: Routine skin care checks             -remove Prevena in one week   6/6- due to be removed tomorrow- however since having new R distal/near incision pain- will ask Dr Lajoyce Corners if can remove today? 7. Fluids/Electrolytes/Nutrition: Routine Is and Os and follow-up chemistries             -continue vitamin C, Nepro, Juven, zinc   8: Hypertension: monitor TID and prn; Lasix and hydralazine held due to soft BP              -continue carvedilol 3.125 mg BID             -  continue Imdur 15 mg daily  6/6- BP well controlled- con't regimen 9: Hyperlipidemia: continue statin   10: DM-2: A1c = 7.9%; CBGs QID; home regimen NPH 70/30 8 units before breakfast and 7 units before supper not restarted             -continue SSI   6/4- Will restart insulin once CBGs a little higher- right now mainly mid 100s- with 1 value 211- I'm concerned about starting insulin at thiese levels- don't want hypoglycemia  6/6- CBGs 153-184 in last 24 hours- will restart 70/30- because he wants home medicine used- 4 units BID-meals and monitor trend CBG (last 3)  Recent Labs    08/25/22 1617 08/25/22 2049 08/26/22 0555  GLUCAP 162* 191* 210*   Increase 70/30 to 5U BID 6/7 ( will likely need to titrate up, has been receiving 6-7U novalog per day with SSI) 11: HFrEF: current EF ~25-30%; Lasix and hydralazine on hold             -daily weight , monitor for fluid overload             -continue Imdur 15 mg daily             -continue Carvedilol 3.125 mg BID   6/4- Weight down 1 kg- will monitor daily-   6/6- no weight in 2 days-  12: Aortic stenosis s/p TAVR 12/2021   13: History of CAD: Continue statin, aspirin, Eliquis             -follows with Dr. Allyson Sabal   14: Anemia of chronic disease/ABLA: follow-up CBC   6/4- Hb down to 7.9 from 8.5- wil recheck Thursday 15: CKD stage 3a: currently Scr at baseline>>1.28; follow-up BMP   6/4- Cr down to 1.14 from 1.28 and Cr 35 up from 31- will recheck Thursday  16: PAD: continue statin, aspirin, Eliquis   17: Hyponatremia; chronic: Lasix held; follow-up BMP   6/4- Na 131- on high side for him- will monitor    Latest Ref Rng & Units 08/25/2022   10:18 AM 08/23/2022    6:06 AM 08/21/2022    4:34 AM  BMP  Glucose 70 - 99 mg/dL 161  096  045   BUN 8 - 23 mg/dL 40  35  31   Creatinine 0.61 - 1.24 mg/dL 4.09  8.11  9.14   Sodium 135 - 145 mmol/L 132  131  130   Potassium 3.5 - 5.1 mmol/L 4.3  4.7   4.6   Chloride 98 - 111 mmol/L 99  97  98   CO2 22 - 32 mmol/L 24  25  23    Calcium 8.9 - 10.3 mg/dL 8.7  8.5  8.6    6/7 stable mild hypoNa+, BUN still up cont hold furosemide  18: Leukocytosis: no fever or known infection; follow-up CBC with diff   6/4- WBC down to normal levels- 10k- will monitor 19: Thrombocytosis: upward trend; follow-up CBC   6/4- Plts stable at 559k 20: GERD: continue Protonix   21: Elevated transaminases: improved; follow-up CMP   AST very slightly elevated at 42- but otherwise resolved 22: Diabetic neuropathy: continue gabapentin 300 mg TID 23. Constipation  6/5- will give Sorbitol 30cc since no BM in 3 days except very small one last night  6/6- had good BM's.  24. R forearm- IV infiltration  6/6- will order warm compresses for R forearm  Addendum- Dr Lajoyce Corners agrees to remove VAC.     LOS: 4  days A FACE TO FACE EVALUATION WAS PERFORMED  Erick Colace 08/26/2022, 8:18 AM

## 2022-08-26 NOTE — Progress Notes (Signed)
Patient ID: Caleb [name], male   DOB: [birth year], 72 y.o.   MRN: [id number]  Discussed with pt DME needs and the need to get to the [place] since they take longer than local equipment company. He is in agreement with drop-arm bedside commode, tub bench, grab bar and amputee pad. He has a wheelchair and rolling walker from past admission. He wants to know from therapies how ling he will be here. He will ask therapists he works with, informed him about 10 days according to evaluations

## 2022-08-26 NOTE — Progress Notes (Signed)
Occupational Therapy Session Note  Patient Details  Name: Caleb [name] MRN: [id number] Date of Birth: 1951/02/10  Today's Date: 08/26/2022 OT Individual Time: 1303-1403 OT Individual Time Calculation (min): 60 min    Short Term Goals: Week 1:  OT Short Term Goal 1 (Week 1): LTG=STG d/t Pt ELOS  Skilled Therapeutic Interventions/Progress Updates:      Therapy Documentation Precautions:  Precautions Precautions: Fall Precaution Comments: R BKA with wound vac Required Braces or Orthoses: Other Brace Other Brace: limb protector Restrictions Weight Bearing Restrictions: Yes RLE Weight Bearing: Non weight bearing General:  "I am ready for this shower." Pt supine in bed upon OT arrival, agreeable to OT session.  Pain: 0/10 pain reported/noted.    ADL: Bed mobility: supine>< EOB SBA with use of hand rails UB dressing: SBA seated in W/C for overhead shirt doffing/donning LB dressing: Min A standing at grab bars to manage over waist for doffing/donning underwear and shorts Footwear: Max A for grip sock d/t time constraint Shower transfer: Min A W/C>< bariatric commode in shower for increased independence for washing peri area, use of grab bars during transfers  Bathing: CGA for lateral leaning to wash peri area, SBA for all other body parts, incision and IV covered during bathing Transfers:  -sit to stand/stand to sit CGA with grab bars  -squat pivot W/C>bed CGA with use of bed rail  Other treatment: After bathing, OT unwrapped ACE wrap from residual limb and replaced with gauze over staples with 4XL shrinker donned.    Education:  OT educated pt on shrinker purpose. OT and pt discussed home set up and DME for safe return home. OT educated pt on purpose of covering incision for showers.    Pt supine in bed with bed alarm activated, 2 bed rails up, call light within reach and 4Ps assessed.   Therapy/Group: Individual Therapy Velia Meyer, OTD, OTR/L  08/26/2022, 3:48 PM

## 2022-08-26 NOTE — Progress Notes (Signed)
Physical Therapy Session Note  Patient Details  Name: Caleb [name] MRN: [id number] Date of Birth: Nov 24, 1950  Today's Date: 08/26/2022 PT Individual Time: 0803-905 PT Individual Time Calculation (min): 62 min   PT Individual Time: 4098-1191 PT Individual Time Calculation (min): 57 min   Short Term Goals: Week 1:  PT Short Term Goal 1 (Week 1): STG=LTG due to LOS  Skilled Therapeutic Interventions/Progress Updates:    First Treatment Session: Pt supine in bed upon arrival and agreeable to therapy. Pt reports pain 5/10 and premedicated by RN; pt due for additional pain meds around 10 am. Pt performed supine to sit with supervision and use of bed rail, verbal cues provided for technique. Mod assist provided to don limb protector on Rt residual limb with education provided on purpose of protector throughout. Pt performed stand pivot transfer with RW and Min A for power up and min assist for pivot to guide RW, verbal cues provided for technique and hand placement with reach back to WC. Max assist provided for [facility] set up with leg rests and pt self propelled WC from room to day room with supervision. Pt positioned WC for stand pivot transfer WC>EOM. Min assist for set up and min assist to power up to stand, CGA to pivot with pt taking small hops. Once EOM pt completed sit<>stand with Min Assist fading to CGA 3x. Pt completed 2 more sit<>stands with standing balance activity performed during each. - Connect Four completed 2x through till pt achieved 4 in a row, ~ 5 minutes total standing time. Pt standing with CGA and using Lt UE to grab disc and place in grid. Rt UE for support on RW. Pt reported increased Rt limb pain and phantom sensation. Mirror therapy utilized for desensitization of Rt residual limb with pt completed Lt LE exercises seated EOM: 2 sets - LAQ 15 reps  - Ankle pumps 15 reps - Marching  15 reps  Following mirror exercises pt completed stand pivot with RW from EOM>WC with CGA for  safety with pt taking low scooting hops, overall lower clearance on Lt LE secondary to fatigue.  Pt self[ propelled with bil UE from day room to room with supervision, pt agreeable to remain up in [facility] at [facility] for next therapy visit with OT. Alarm on and call bell within reach, all needs met.  Second Treatment Session: Pt received in bed and agreeable to therapy session despite fatigue from last PT and OT visits. Pt reports 4.5/10 pain in Rt residual limb. Pt completed supine>sit with supervision. Pt completed stand pivot with RW from EOM>WC with CGA, pt self propelled WC from room to day room with supervision with bil Ue's.   WC set up by therapist for squat pivot transfer towards Lt side. Educated on setup of arm rests and brakes. Cues for head:hip relationship to facilitate direction of transfer and min assist provided for WC>EOM squat pivot, completed in 3 parts. Once EOM min assist provided to remove limb protector for seated exercises with residual limb. Pt completed 2x10 reps LAQ and hamstring curl with gentle resistance added by red theraband. Gentle massage for desensitization completed following each exercise set. Pt reported increased pain and moved sit>supine. Pt performed 1x 10 reps supine bridge with Rt knee on bolster and Lt LE in hook lying position. Pt reporting increased pain and nausea and supine rest provided for ~5 minutes with therapist providing manual desensitization to Rt residual limb with gentle massage. Pt move supine>sit with supervision on mat  and Min assist provided for squat pivot transfer EOM>WC. Pt dependently transported back to room due to fatigue and for time management. In room pt requesting return to bed. Min assist for squat pivot transfer WC>bed and pt returned to supine with supervision. EOS Alarm on and call bell within reach and all needs within reach.   Noted armrests incredibly difficult to remove for squat pivot/lateral transfers. Will try to find replacement  armrests with better fit   Therapy Documentation Precautions:  Precautions Precautions: Fall Precaution Comments: R BKA with wound vac Required Braces or Orthoses: Other Brace Other Brace: limb protector Restrictions Weight Bearing Restrictions: Yes RLE Weight Bearing: Non weight bearing    Therapy/Group: Individual Therapy  Wynn Maudlin, DPT Acute Rehabilitation Services Office [phone]  08/26/22 4:20 PM

## 2022-08-26 NOTE — Progress Notes (Signed)
Occupational Therapy Session Note  Patient Details  Name: MOHD. DERFLINGER MRN: [id number] Date of Birth: [birth year]  Today's Date: 08/26/2022 OT Individual Time: 0930-1000 OT Individual Time Calculation (min): 30 min    Short Term Goals: Week 1:  OT Short Term Goal 1 (Week 1): LTG=STG d/t Pt ELOS  Skilled Therapeutic Interventions/Progress Updates:    Pt resting in bed upon arrival. Pain per below. OT intervention with focus on BUE strengthening with Theraband. Pt completed circuit of shoulder abduction, chest presses, diagonals x 10 with rest breaks between each set. Pt remained in bed with all needs within reach. Bed alarm activated.   Therapy Documentation Precautions:  Precautions Precautions: Fall Precaution Comments: R BKA with wound vac Required Braces or Orthoses: Other Brace Other Brace: limb protector Restrictions Weight Bearing Restrictions: Yes RLE Weight Bearing: Non weight bearing Pain:  Pt reports 5/10 pain in RLE with sporadic shooting pain rated 10/10; emotional support, repositioned   Therapy/Group: Individual Therapy  Rich Brave 08/26/2022, 10:04 AM

## 2022-08-26 NOTE — Progress Notes (Signed)
Occupational Therapy Session Note  Patient Details  Name: Caleb [name] MRN: [id number] Date of Birth: [birth year]  {CHL IP REHAB OT TIME CALCULATIONS:304400400}  {CHL IP REHAB OT TIME CALCULATIONS:304400400}  Short Term Goals: Week 1:  OT Short Term Goal 1 (Week 1): LTG=STG d/t Pt ELOS  Skilled Therapeutic Interventions/Progress Updates:   Session 1: Pt received *** for skilled OT session with focus on ***. Pt agreeable to interventions, demonstrating overall *** mood. Pt reported ***/10 pain, stating "***" in reference to ***. OT offering intermediate rest breaks and positioning suggestions throughout session to address pain/fatigue and maximize participation/safety in session.    Pt remained *** with all immediate needs met at end of session. Pt continues to be appropriate for skilled OT intervention to promote further functional independence.   Session 2: Pt received *** for skilled OT session with focus on ***. Pt agreeable to interventions, demonstrating overall *** mood. Pt reported ***/10 pain, stating "***" in reference to ***. OT offering intermediate rest breaks and positioning suggestions throughout session to address pain/fatigue and maximize participation/safety in session.    Pt remained *** with all immediate needs met at end of session. Pt continues to be appropriate for skilled OT intervention to promote further functional independence.    Therapy Documentation Precautions:  Precautions Precautions: Fall Precaution Comments: R BKA with wound vac Required Braces or Orthoses: Other Brace Other Brace: limb protector Restrictions Weight Bearing Restrictions: Yes RLE Weight Bearing: Non weight bearing   Therapy/Group: Individual Therapy  Lou Cal, OTR/L, MSOT  08/26/2022, 6:37 PM

## 2022-08-27 DIAGNOSIS — S88111A Complete traumatic amputation at level between knee and ankle, right lower leg, initial encounter: Secondary | ICD-10-CM | POA: Diagnosis not present

## 2022-08-27 LAB — GLUCOSE, CAPILLARY
Glucose-Capillary: 174 mg/dL — ABNORMAL HIGH (ref 70–99)
Glucose-Capillary: 147 mg/dL — ABNORMAL HIGH (ref 70–99)
Glucose-Capillary: 165 mg/dL — ABNORMAL HIGH (ref 70–99)
Glucose-Capillary: 129 mg/dL — ABNORMAL HIGH (ref 70–99)

## 2022-08-27 NOTE — Progress Notes (Signed)
PROGRESS NOTE   Subjective/Complaints: No events overnight. C/o phantom pains but overall feels well controlled. No needs endorsed.  Vitals stable Last BM 6/6  ROS: + Phantom limb pain Pt denies SOB, abd pain, CP, N/V/C/D  Except for HPI Objective:   No results found. Recent Labs    08/25/22 1018  WBC 9.7  HGB 8.9*  HCT 28.3*  PLT 597*    Recent Labs    08/25/22 1018  NA 132*  K 4.3  CL 99  CO2 24  GLUCOSE 213*  BUN 40*  CREATININE 1.19  CALCIUM 8.7*     Intake/Output Summary (Last 24 hours) at 08/27/2022 1018 Last data filed at 08/27/2022 0736 Gross per 24 hour  Intake 493 ml  Output --  Net 493 ml         Physical Exam: Vital Signs Blood pressure 116/70, pulse 91, temperature 98.5 F (36.9 C), temperature source Oral, resp. rate 18, height 5' 6.93" (1.7 m), weight 76 kg, SpO2 100 %.  General: No acute distress. Sitting in bed.  Mood and affect are appropriate Heart: Regular rate and rhythm no rubs murmurs or extra sounds Lungs: Clear to auscultation, breathing unlabored, no rales or wheezes Abdomen: Positive bowel sounds, soft nontender to palpation, nondistended Skin: Surgical site sutures intact, well approximated, mild serosangionous drainage from 2 eraser sized areas on middle incision line. No erythema. Shaping well  Musculoskeletal:        General: Normal range of motion.     Neurological:     General: No focal deficit present.     Mental Status: He is alert and oriented to person, place, and time.  Psychiatric:        Mood and Affect: Mood normal.        Behavior: Behavior normal.   Motor 4/5 Bilateral grip 5/5 in bialteral delt , Bi, Tri, Hip flexors left knee ext, 3- Left ankle DF PF Sensation absent to LT below the midcalf  Left foot and bilateral hand intrinsice atrophy  MSK- Left foot with 4th and 5th toe amps well healed   Assessment/Plan: 1. Functional deficits which  require 3+ hours per day of interdisciplinary therapy in a comprehensive inpatient rehab setting. Physiatrist is providing close team supervision and 24 hour management of active medical problems listed below. Physiatrist and rehab team continue to assess barriers to discharge/monitor patient progress toward functional and medical goals  Care Tool:  Bathing    Body parts bathed by patient: Right arm, Left arm, Chest, Abdomen, Front perineal area, Buttocks, Face, Left lower leg, Left upper leg, Right upper leg     Body parts n/a: Right lower leg   Bathing assist Assist Level: Contact Guard/Touching assist     Upper Body Dressing/Undressing Upper body dressing   What is the patient wearing?: Pull over shirt    Upper body assist Assist Level: Supervision/Verbal cueing    Lower Body Dressing/Undressing Lower body dressing      What is the patient wearing?: Pants, Underwear/pull up     Lower body assist Assist for lower body dressing: Moderate Assistance - Patient 50 - 74%     Toileting Toileting  Toileting assist Assist for toileting: Minimal Assistance - Patient > 75%     Transfers Chair/bed transfer  Transfers assist     Chair/bed transfer assist level: Minimal Assistance - Patient > 75%     Locomotion Ambulation   Ambulation assist      Assist level: Minimal Assistance - Patient > 75% Assistive device: Walker-rolling Max distance: 80ft   Walk 10 feet activity   Assist     Assist level: Minimal Assistance - Patient > 75% Assistive device: Walker-rolling   Walk 50 feet activity   Assist Walk 50 feet with 2 turns activity did not occur: Safety/medical concerns (pain)         Walk 150 feet activity   Assist Walk 150 feet activity did not occur: Safety/medical concerns (pain)         Walk 10 feet on uneven surface  activity   Assist Walk 10 feet on uneven surfaces activity did not occur: Safety/medical concerns (pain)          Wheelchair     Assist Is the patient using a wheelchair?: Yes Type of Wheelchair: Manual    Wheelchair assist level: Supervision/Verbal cueing Max wheelchair distance: 13ft    Wheelchair 50 feet with 2 turns activity    Assist        Assist Level: Supervision/Verbal cueing   Wheelchair 150 feet activity     Assist      Assist Level: Supervision/Verbal cueing   Blood pressure 116/70, pulse 91, temperature 98.5 F (36.9 C), temperature source Oral, resp. rate 18, height 5' 6.93" (1.7 m), weight 76 kg, SpO2 100 %.  Medical Problem List and Plan: 1. Functional deficits secondary to Right BKA secondary to osteomyelitis RIght foot             -patient may  shower since wound vac is removed             -ELOS/Goals: 7-10d Sup/Mod I goals   Con't CIR PT and OT- ~ 2 weeks  2.  Antithrombotics: -DVT/anticoagulation:  Pharmaceutical: Eliquis             -antiplatelet therapy: aspirin 81 mg daily   3. Pain Management: Tylenol as needed             -continue gabapentin 300 mg TID   6/4- on Norco as needed q4 hours prn for pain- is working so far  6/5- piriformis syndrome- will start stretching and tennis ball usage; Also change Norco to Dilaudid since don't want to increase Norco due to tylenol- and cannot tolerate Oxy- 2 mg q4 hours prn  6/6- will d/w therapy about piriformis stretches- pain doing better on Dliaudid than Norco- better controlled  6/8: Discussed massage for phangom limb pains. Overall better.   4. Mood/Behavior/Sleep: LCSW to evaluate and provide emotional support             -antipsychotic agents: n/a   5. Neuropsych/cognition: This patient is capable of making decisions on his own behalf.   6. Skin/Wound Care: Routine skin care checks             -remove Prevena in one week   6/6- due to be removed tomorrow- however since having new R distal/near incision pain- will ask Dr Lajoyce Corners if can remove today - removed  7. Fluids/Electrolytes/Nutrition:  Routine Is and Os and follow-up chemistries             -continue vitamin C, Nepro, Juven, zinc   8: Hypertension: monitor  TID and prn; Lasix and hydralazine held due to soft BP             -continue carvedilol 3.125 mg BID             -continue Imdur 15 mg daily  6/6- BP well controlled- con't regimen 9: Hyperlipidemia: continue statin   10: DM-2: A1c = 7.9%; CBGs QID; home regimen NPH 70/30 8 units before breakfast and 7 units before supper not restarted             -continue SSI   6/4- Will restart insulin once CBGs a little higher- right now mainly mid 100s- with 1 value 211- I'm concerned about starting insulin at thiese levels- don't want hypoglycemia  6/6- CBGs 153-184 in last 24 hours- will restart 70/30- because he wants home medicine used- 4 units BID-meals and monitor trend  6/8: stable CBG (last 3)  Recent Labs    08/26/22 1649 08/26/22 2104 08/27/22 0643  GLUCAP 158* 139* 174*    Increase 70/30 to 5U BID 6/7 ( will likely need to titrate up, has been receiving 6-7U novalog per day with SSI) 11: HFrEF: current EF ~25-30%; Lasix and hydralazine on hold             -daily weight , monitor for fluid overload             -continue Imdur 15 mg daily             -continue Carvedilol 3.125 mg BID   6/4- Weight down 1 kg- will monitor daily-   6/6- no weight in 2 days-   6/8: Downtrending  Filed Weights   08/22/22 1950 08/23/22 0500 08/27/22 0500  Weight: 80.6 kg 79.4 kg 76 kg     12: Aortic stenosis s/p TAVR 12/2021   13: History of CAD: Continue statin, aspirin, Eliquis             -follows with Dr. Allyson Sabal   14: Anemia of chronic disease/ABLA: follow-up CBC   6/4- Hb down to 7.9 from 8.5- wil recheck Thursday 15: CKD stage 3a: currently Scr at baseline>>1.28; follow-up BMP   6/4- Cr down to 1.14 from 1.28 and Cr 35 up from 31- will recheck Thursday  16: PAD: continue statin, aspirin, Eliquis   17: Hyponatremia; chronic: Lasix held; follow-up BMP   6/4- Na 131- on  high side for him- will monitor    Latest Ref Rng & Units 08/25/2022   10:18 AM 08/23/2022    6:06 AM 08/21/2022    4:34 AM  BMP  Glucose 70 - 99 mg/dL 563  875  643   BUN 8 - 23 mg/dL 40  35  31   Creatinine 0.61 - 1.24 mg/dL 3.29  5.18  8.41   Sodium 135 - 145 mmol/L 132  131  130   Potassium 3.5 - 5.1 mmol/L 4.3  4.7  4.6   Chloride 98 - 111 mmol/L 99  97  98   CO2 22 - 32 mmol/L 24  25  23    Calcium 8.9 - 10.3 mg/dL 8.7  8.5  8.6    6/7 stable mild hypoNa+, BUN still up cont hold furosemide   18: Leukocytosis: no fever or known infection; follow-up CBC with diff   6/4- WBC down to normal levels- 10k- will monitor 19: Thrombocytosis: upward trend; follow-up CBC   6/4- Plts stable at 559k 20: GERD: continue Protonix   21: Elevated transaminases: improved; follow-up CMP  AST very slightly elevated at 42- but otherwise resolved  22: Diabetic neuropathy: continue gabapentin 300 mg TID 23. Constipation  6/5- will give Sorbitol 30cc since no BM in 3 days except very small one last night  6/6- had good BM's.  24. R forearm- IV infiltration  6/6- will order warm compresses for R forearm   LOS: 5 days A FACE TO FACE EVALUATION WAS PERFORMED  Angelina Sheriff 08/27/2022, 10:18 AM

## 2022-08-27 NOTE — Progress Notes (Signed)
Physical Therapy Session Note  Patient Details  Name: Caleb [name] MRN: [id number] Date of Birth: 01-26-1951  Today's Date: 08/27/2022 PT Individual Time: 3244-0102 PT Individual Time Calculation (min): 56 min   Short Term Goals: Week 1:  PT Short Term Goal 1 (Week 1): STG=LTG due to LOS  Skilled Therapeutic Interventions/Progress Updates:    Chart reviewed and pt agreeable to therapy. Pt received semi-reclined in bed with no c/o pain. Also of note, pt did c/o periodic phantom pain t/o session that resolved with momentary rest. Session focused on functional transfers and WC mobility and endurance. Pt initiated session with transfer to [facility] with SPT using MinA. Pt remained safe t/o transfer, but was able to problem solve that a closer set up would decrease energy expenditure and need for assistance. Pt then completed series of sit to stands using MinA + RW and progressing to CGA + RW with VC for sequencing. Pt then reviewed leg rests with PT and required ModA to place leg rests. Pt then completed >1025ft WC mobility for ~64mins to first floor and outside with supervision and periodic CGA. Pt noted to have decreased LUE use with fatigue and began requiring VC for safety on L side. Pt navigated over mild inclines and outdoor surfaces with S and increased time 2/2 fatigue. PT and pt discussed WC safety during short, seated rest break, with emphasis on safety with inclines and LUE use. Pt then completed WC navigation for >1018ft to return to first floor. PT assisted final transfer back to room 2/2 fatigue. In room, pt completed SPT with CGA to return to bed. At end of session, pt was left semi-reclined in bed with alarm engaged, nurse call bell and all needs in reach.     Therapy Documentation Precautions:  Precautions Precautions: Fall Precaution Comments: R BKA with wound vac Required Braces or Orthoses: Other Brace Other Brace: limb protector Restrictions Weight Bearing Restrictions:  Yes RLE Weight Bearing: Non weight bearing General:      Therapy/Group: Individual Therapy  Dionne Milo 08/27/2022, 3:36 PM

## 2022-08-27 NOTE — Progress Notes (Signed)
Physical Therapy Session Note  Patient Details  Name: Caleb [name] MRN: [id number] Date of Birth: 02-08-1951  Today's Date: 08/27/2022 PT Individual Time: 0802-0848 PT Individual Time Calculation (min): 46 min   Short Term Goals: Week 1:  PT Short Term Goal 1 (Week 1): STG=LTG due to LOS  Skilled Therapeutic Interventions/Progress Updates: Patient supine in bed with nurse present providing morning medication and shots on entrance to room. Patient alert and agreeable to PT session.   Patient reported 5/10 "sore" pain in R residual limb this morning  Therapeutic Activity: Bed Mobility: Pt performed supine<>sit on EOB with supervision for safety. Transfers: Pt performed squat pivot transfers throughout session with light minA for safety, and modA for WC set up for transfer with cues to lock brake close to surface, and to roll opposite wheel towards surface to get close as possible. Patient also required minA to take off arm rest (arm rest on WC are noted to be a bit difficult to. PTA adjusted the one on R so it fits better).  -Patient required use of urinal and proved distant supervision while sitting EOB with PTA behind curtain giving privacy.  Wheelchair Mobility:  Pt propelled wheelchair room<>day room gym with supervision for safety (150'+). Patient requires modA for WC setup for transfer ready, and propulsion readiness per difficulty finding lever to release B foot pedals, and to adjust WC for transfer set up (indicated above).   Therapeutic Exercise: Pt performed the following exercises with therapist providing the described cuing and facilitation for improvement. -1 x X 10 sit to stands hi/low mat set to 18". Patient cued to anteriorly scoot as to avoid using back of knees to assist with stand. Patient initially CGA/light minA, then progressed to close supervision/light CGA for safety. PTA stabilized RW for safety.   Patient in bed at end of session with brakes locked, bed alarm set,  and all needs within reach.      Therapy Documentation Precautions:  Precautions Precautions: Fall Precaution Comments: R BKA with wound vac Required Braces or Orthoses: Other Brace Other Brace: limb protector Restrictions Weight Bearing Restrictions: Yes RLE Weight Bearing: Non weight bearing  Therapy/Group: Individual Therapy  Ashia Dehner PTA 08/27/2022, 12:26 PM

## 2022-08-28 DIAGNOSIS — S88111A Complete traumatic amputation at level between knee and ankle, right lower leg, initial encounter: Secondary | ICD-10-CM | POA: Diagnosis not present

## 2022-08-28 LAB — GLUCOSE, CAPILLARY
Glucose-Capillary: 134 mg/dL — ABNORMAL HIGH (ref 70–99)
Glucose-Capillary: 155 mg/dL — ABNORMAL HIGH (ref 70–99)
Glucose-Capillary: 114 mg/dL — ABNORMAL HIGH (ref 70–99)
Glucose-Capillary: 152 mg/dL — ABNORMAL HIGH (ref 70–99)

## 2022-08-28 MED ORDER — GABAPENTIN 300 MG PO CAPS
600.0000 mg | ORAL_CAPSULE | Freq: Every day | ORAL | Status: DC
  Administered 2022-08-28 – 2022-09-04 (×8): 600 mg via ORAL
  Filled 2022-08-28 (×8): qty 2

## 2022-08-28 MED ORDER — GABAPENTIN 300 MG PO CAPS
300.0000 mg | ORAL_CAPSULE | Freq: Two times a day (BID) | ORAL | Status: DC
  Administered 2022-08-29 – 2022-09-05 (×15): 300 mg via ORAL
  Filled 2022-08-28 (×15): qty 1

## 2022-08-28 NOTE — Progress Notes (Signed)
PRN dilaudid given at 2227 & 0437, for c/o right phantom pain. Reports feeling pain in right foot. Decreased dexterity to Bilateral hands from neuropathy, per patient. Wearing gloves. Caleb [name] A

## 2022-08-28 NOTE — Plan of Care (Signed)
  Problem: RH BOWEL ELIMINATION Goal: RH STG MANAGE BOWEL WITH ASSISTANCE Description: STG Manage Bowel with supv Assistance. Outcome: Progressing   Problem: RH SAFETY Goal: RH STG ADHERE TO SAFETY PRECAUTIONS W/ASSISTANCE/DEVICE Description: STG Adhere to Safety Precautions With cueing Assistance/Device. Outcome: Progressing   Problem: RH SAFETY Goal: RH STG DECREASED RISK OF FALL WITH ASSISTANCE Description: STG Decreased Risk of Fall With min Assistance. Outcome: Progressing   Problem: RH PAIN MANAGEMENT Goal: RH STG PAIN MANAGED AT OR BELOW PT'S PAIN GOAL Description: Pain will be managed at 4 out of 10 on pain scale with PRN medications min assist Outcome: Progressing

## 2022-08-28 NOTE — Progress Notes (Signed)
PROGRESS NOTE   Subjective/Complaints: No events overnight. Ongoing phantom pains, use of PRN dilaudid.  States that he feels overall the phantom pain is stable to improving nightly, agreeable to increasing gabapentin at night in effort to reduce as needed pain medication. Vitals stable Last BM 6/6  ROS: + Phantom limb pain Pt denies SOB, abd pain, CP, N/V/C/D  Except for HPI Objective:   No results found. No results for input(s): "WBC", "HGB", "HCT", "PLT" in the last 72 hours.  No results for input(s): "NA", "K", "CL", "CO2", "GLUCOSE", "BUN", "CREATININE", "CALCIUM" in the last 72 hours.   Intake/Output Summary (Last 24 hours) at 08/28/2022 1146 Last data filed at 08/28/2022 1034 Gross per 24 hour  Intake 1060 ml  Output 2550 ml  Net -1490 ml         Physical Exam: Vital Signs Blood pressure 115/71, pulse 96, temperature 98.8 F (37.1 C), temperature source Oral, resp. rate 18, height 5' 6.93" (1.7 m), weight 74.8 kg, SpO2 98 %.  General: No acute distress.  Laying in bed. Mood and affect are appropriate Heart: Regular rate and rhythm no rubs murmurs or extra sounds Lungs: Clear to auscultation, breathing unlabored, no rales or wheezes Abdomen: Positive bowel sounds, soft nontender to palpation, nondistended Skin: Surgical site sutures intact, well approximated, mild serosangionous drainage from 2 eraser sized areas on middle incision line. No erythema. Shaping well-6/9 covered in shrinker   Musculoskeletal:        General: Normal range of motion.     Neurological:     General: No focal deficit present.     Mental Status: He is alert and oriented to person, place, and time.  Psychiatric:        Mood and Affect: Mood normal.        Behavior: Behavior normal.   Motor 4/5 Bilateral grip 5/5 in bialteral delt , Bi, Tri, Hip flexors left knee ext, 3- Left ankle DF PF Sensation absent to LT below the midcalf   Left foot and bilateral hand intrinsice atrophy  MSK- Left foot with 4th and 5th toe amps well healed   Assessment/Plan: 1. Functional deficits which require 3+ hours per day of interdisciplinary therapy in a comprehensive inpatient rehab setting. Physiatrist is providing close team supervision and 24 hour management of active medical problems listed below. Physiatrist and rehab team continue to assess barriers to discharge/monitor patient progress toward functional and medical goals  Care Tool:  Bathing    Body parts bathed by patient: Right arm, Left arm, Chest, Abdomen, Front perineal area, Buttocks, Face, Left lower leg, Left upper leg, Right upper leg     Body parts n/a: Right lower leg   Bathing assist Assist Level: Contact Guard/Touching assist     Upper Body Dressing/Undressing Upper body dressing   What is the patient wearing?: Pull over shirt    Upper body assist Assist Level: Supervision/Verbal cueing    Lower Body Dressing/Undressing Lower body dressing      What is the patient wearing?: Pants, Underwear/pull up     Lower body assist Assist for lower body dressing: Moderate Assistance - Patient 50 - 74%     Toileting  Toileting    Toileting assist Assist for toileting: Minimal Assistance - Patient > 75%     Transfers Chair/bed transfer  Transfers assist     Chair/bed transfer assist level: Minimal Assistance - Patient > 75%     Locomotion Ambulation   Ambulation assist      Assist level: Minimal Assistance - Patient > 75% Assistive device: Walker-rolling Max distance: 49ft   Walk 10 feet activity   Assist     Assist level: Minimal Assistance - Patient > 75% Assistive device: Walker-rolling   Walk 50 feet activity   Assist Walk 50 feet with 2 turns activity did not occur: Safety/medical concerns (pain)         Walk 150 feet activity   Assist Walk 150 feet activity did not occur: Safety/medical concerns (pain)          Walk 10 feet on uneven surface  activity   Assist Walk 10 feet on uneven surfaces activity did not occur: Safety/medical concerns (pain)         Wheelchair     Assist Is the patient using a wheelchair?: Yes Type of Wheelchair: Manual    Wheelchair assist level: Supervision/Verbal cueing Max wheelchair distance: 144ft    Wheelchair 50 feet with 2 turns activity    Assist        Assist Level: Supervision/Verbal cueing   Wheelchair 150 feet activity     Assist      Assist Level: Supervision/Verbal cueing   Blood pressure 115/71, pulse 96, temperature 98.8 F (37.1 C), temperature source Oral, resp. rate 18, height 5' 6.93" (1.7 m), weight 74.8 kg, SpO2 98 %.  Medical Problem List and Plan: 1. Functional deficits secondary to Right BKA secondary to osteomyelitis RIght foot             -patient may  shower since wound vac is removed             -ELOS/Goals: 7-10d Sup/Mod I goals   Con't CIR PT and OT- ~ 2 weeks  2.  Antithrombotics: -DVT/anticoagulation:  Pharmaceutical: Eliquis             -antiplatelet therapy: aspirin 81 mg daily   3. Pain Management: Tylenol as needed             -continue gabapentin 300 mg TID   6/4- on Norco as needed q4 hours prn for pain- is working so far  6/5- piriformis syndrome- will start stretching and tennis ball usage; Also change Norco to Dilaudid since don't want to increase Norco due to tylenol- and cannot tolerate Oxy- 2 mg q4 hours prn  6/6- will d/w therapy about piriformis stretches- pain doing better on Dliaudid than Norco- better controlled  6/8: Discussed massage for phangom limb pains. Overall better.   6/9: See below; increase gabapentin to 300/300/600 mg  4. Mood/Behavior/Sleep: LCSW to evaluate and provide emotional support             -antipsychotic agents: n/a   5. Neuropsych/cognition: This patient is capable of making decisions on his own behalf.   6. Skin/Wound Care: Routine skin care checks              -remove Prevena in one week   6/6- due to be removed tomorrow- however since having new R distal/near incision pain- will ask Dr Lajoyce Corners if can remove today - removed  7. Fluids/Electrolytes/Nutrition: Routine Is and Os and follow-up chemistries             -  continue vitamin C, Nepro, Juven, zinc   8: Hypertension: monitor TID and prn; Lasix and hydralazine held due to soft BP             -continue carvedilol 3.125 mg BID             -continue Imdur 15 mg daily  6/6- BP well controlled- con't regimen 9: Hyperlipidemia: continue statin   10: DM-2: A1c = 7.9%; CBGs QID; home regimen NPH 70/30 8 units before breakfast and 7 units before supper not restarted             -continue SSI   6/4- Will restart insulin once CBGs a little higher- right now mainly mid 100s- with 1 value 211- I'm concerned about starting insulin at thiese levels- don't want hypoglycemia  6/6- CBGs 153-184 in last 24 hours- will restart 70/30- because he wants home medicine used- 4 units BID-meals and monitor trend  6/8-9: stable CBG (last 3)  Recent Labs    08/27/22 2113 08/28/22 0549 08/28/22 1135  GLUCAP 129* 134* 155*    Increase 70/30 to 5U BID 6/7 ( will likely need to titrate up, has been receiving 6-7U novalog per day with SSI) 11: HFrEF: current EF ~25-30%; Lasix and hydralazine on hold             -daily weight , monitor for fluid overload             -continue Imdur 15 mg daily             -continue Carvedilol 3.125 mg BID   6/4- Weight down 1 kg- will monitor daily-   6/6- no weight in 2 days-   6/8-9: Downtrending  Filed Weights   08/23/22 0500 08/27/22 0500 08/28/22 0437  Weight: 79.4 kg 76 kg 74.8 kg     12: Aortic stenosis s/p TAVR 12/2021   13: History of CAD: Continue statin, aspirin, Eliquis             -follows with Dr. Allyson Sabal   14: Anemia of chronic disease/ABLA: follow-up CBC   6/4- Hb down to 7.9 from 8.5- wil recheck Thursday 15: CKD stage 3a: currently Scr at baseline>>1.28;  follow-up BMP   6/4- Cr down to 1.14 from 1.28 and Cr 35 up from 31- will recheck Thursday  16: PAD: continue statin, aspirin, Eliquis   17: Hyponatremia; chronic: Lasix held; follow-up BMP   6/4- Na 131- on high side for him- will monitor    Latest Ref Rng & Units 08/25/2022   10:18 AM 08/23/2022    6:06 AM 08/21/2022    4:34 AM  BMP  Glucose 70 - 99 mg/dL 161  096  045   BUN 8 - 23 mg/dL 40  35  31   Creatinine 0.61 - 1.24 mg/dL 4.09  8.11  9.14   Sodium 135 - 145 mmol/L 132  131  130   Potassium 3.5 - 5.1 mmol/L 4.3  4.7  4.6   Chloride 98 - 111 mmol/L 99  97  98   CO2 22 - 32 mmol/L 24  25  23    Calcium 8.9 - 10.3 mg/dL 8.7  8.5  8.6    6/7 stable mild hypoNa+, BUN still up cont hold furosemide   18: Leukocytosis: no fever or known infection; follow-up CBC with diff   6/4- WBC down to normal levels- 10k- will monitor 19: Thrombocytosis: upward trend; follow-up CBC   6/4- Plts stable at 559k 20: GERD: continue  Protonix   21: Elevated transaminases: improved; follow-up CMP   AST very slightly elevated at 42- but otherwise resolved  22: Diabetic neuropathy: continue gabapentin 300 mg TID   - 6/9: Last 2 Cr WNL; given ongoing phantom pains QHS increase gabapentin to 300/300/600 mg  23. Constipation  6/5- will give Sorbitol 30cc since no BM in 3 days except very small one last night  6/6- had good BM's.   24. R forearm- IV infiltration  6/6- will order warm compresses for R forearm   LOS: 6 days A FACE TO FACE EVALUATION WAS PERFORMED  Angelina Sheriff 08/28/2022, 11:46 AM

## 2022-08-29 DIAGNOSIS — E871 Hypo-osmolality and hyponatremia: Secondary | ICD-10-CM

## 2022-08-29 DIAGNOSIS — G548 Other nerve root and plexus disorders: Secondary | ICD-10-CM | POA: Diagnosis not present

## 2022-08-29 DIAGNOSIS — S88111A Complete traumatic amputation at level between knee and ankle, right lower leg, initial encounter: Secondary | ICD-10-CM | POA: Diagnosis not present

## 2022-08-29 DIAGNOSIS — N1831 Chronic kidney disease, stage 3a: Secondary | ICD-10-CM | POA: Diagnosis not present

## 2022-08-29 DIAGNOSIS — R52 Pain, unspecified: Secondary | ICD-10-CM

## 2022-08-29 DIAGNOSIS — I5022 Chronic systolic (congestive) heart failure: Secondary | ICD-10-CM

## 2022-08-29 LAB — GLUCOSE, CAPILLARY
Glucose-Capillary: 180 mg/dL — ABNORMAL HIGH (ref 70–99)
Glucose-Capillary: 143 mg/dL — ABNORMAL HIGH (ref 70–99)
Glucose-Capillary: 193 mg/dL — ABNORMAL HIGH (ref 70–99)
Glucose-Capillary: 163 mg/dL — ABNORMAL HIGH (ref 70–99)

## 2022-08-29 LAB — CBC
HCT: 27.7 % — ABNORMAL LOW (ref 39.0–52.0)
Hemoglobin: 8.5 g/dL — ABNORMAL LOW (ref 13.0–17.0)
MCH: 25.8 pg — ABNORMAL LOW (ref 26.0–34.0)
MCHC: 30.7 g/dL (ref 30.0–36.0)
MCV: 83.9 fL (ref 80.0–100.0)
Platelets: 527 10*3/uL — ABNORMAL HIGH (ref 150–400)
RBC: 3.3 MIL/uL — ABNORMAL LOW (ref 4.22–5.81)
RDW: 16.8 % — ABNORMAL HIGH (ref 11.5–15.5)
WBC: 7.3 10*3/uL (ref 4.0–10.5)
nRBC: 0 % (ref 0.0–0.2)

## 2022-08-29 LAB — BASIC METABOLIC PANEL
Anion gap: 11 (ref 5–15)
BUN: 77 mg/dL — ABNORMAL HIGH (ref 8–23)
CO2: 24 mmol/L (ref 22–32)
Calcium: 9.1 mg/dL (ref 8.9–10.3)
Chloride: 96 mmol/L — ABNORMAL LOW (ref 98–111)
Creatinine, Ser: 1.22 mg/dL (ref 0.61–1.24)
GFR, Estimated: >60 mL/min (ref >60)
Glucose, Bld: 171 mg/dL — ABNORMAL HIGH (ref 70–99)
Potassium: 4.9 mmol/L (ref 3.5–5.1)
Sodium: 131 mmol/L — ABNORMAL LOW (ref 135–145)

## 2022-08-29 NOTE — Progress Notes (Signed)
Physical Therapy Session Note  Patient Details  Name: Caleb [name] MRN: [id number] Date of Birth: 09-02-1950  Today's Date: 08/29/2022 PT Individual Time: 1500-1525 PT Individual Time Calculation (min): 25 min   Short Term Goals: Week 1:  PT Short Term Goal 1 (Week 1): STG=LTG due to LOS  Skilled Therapeutic Interventions/Progress Updates:    Chart reviewed and pt agreeable to therapy. Pt received semi-reclined in bed with 5/10 c/o phantom pain. Also of note, pt reported high level of fatigue. Session focused on education of home exercises and self-management of RLE to prevent complications after d/c. Pt initiated session with LE strengthening including glute bridges, S/L hip abd, and S/L hip ext. Pt noted to require VC + education for proper mechanics t/o. Pt then practiced positioning in prone position. Pt able to tolerate position and complete knee flex and hip ext movements in prone. Session education emphasized prevention of contracture. At end of session, pt was left semi-reclined in bed with alarm engaged, nurse call bell and all needs in reach.     Therapy Documentation Precautions:  Precautions Precautions: Fall Precaution Comments: R BKA with wound vac Required Braces or Orthoses: Other Brace Other Brace: limb protector Restrictions Weight Bearing Restrictions: Yes RLE Weight Bearing: Non weight bearing General:       Therapy/Group: Individual Therapy  Dionne Milo, PT, DPT 08/29/2022, 3:46 PM

## 2022-08-29 NOTE — Progress Notes (Signed)
Per chart, patient has not had a bowel movement since 6/6, patient states he had 2 on 6/7. Patient agreed to take Dulcolax, given.

## 2022-08-29 NOTE — Progress Notes (Signed)
Physical Therapy Session Note  Patient Details  Name: Caleb [name] MRN: [id number] Date of Birth: 06-01-1950  Today's Date: 08/29/2022 PT Individual Time: 1046-1200 PT Individual Time Calculation (min): 74 min   Short Term Goals: Week 1:  PT Short Term Goal 1 (Week 1): STG=LTG due to LOS  Skilled Therapeutic Interventions/Progress Updates:    Pt presents in room in bed, asleep, requires increased time to wake up however motivated to participate once awakened. Pt denies pain at this time but reports increased phantom pain during session. Session focused on NMR for mirror therapy to reduce phantom limb pain, therapeutic exercise in standing for RLE open chain strengthening, LLE closed chain strengthening, and gait training for multidirectional walking with RW needed for navigating narrow spaces and obstacles.  Pt completes bed mobility with supervision, completes squat pivot transfer with min assist, multiple scoots to complete transfer. Pt self propels ~150' from room to day room with supervision for directional cues.  Pt educated on phantom pain and interventions to address phantom pain including self massage for R residual limb. Pt doffs limb protector with increased time however does not require cues for sequencing. Pt completes mirror therapy for ~6 minutes for 30 seconds of LAQs, heel raise, and digit wiggling and of towel scrunch and sensation with therapist rubbing towel on L foot and lower leg, with min to moderate verbal cues to watch opposite foot in mirror throughout. Therapist dons limb protector for time management, verbal cues for positioning of knee strap to encourage terminal knee extension.  Pt then completes standing therex to promote open chain strengthening RLE, closed chain strengthening LLE and core strengthening in standing with BUE support on RW and sitting including: Standing SLR x10 RLE Standing hip abduction x10 RLE Standing hip extension x10 RLE (cues for  upright posture and looking forward) Standing mini squats x10 LLE Standing heel raise x10 LLE Seated modified sit up with weighted ball x10 WC press ups x10  Pt completes forward/backward ambulation 3x5' with RW CGA to promote postural stability with multidirectional ambulation for negotiating narrow spaces and obstacles, min cues for sequencing.  Pt requires rest breaks between all exercises and gait trials for energy conservation and improve quality with tasks.  Pt self propels 150' in [facility] back to room distant supervision for directional cues in busy hallway. Pt completes squat pivot transfer with min assist and verbal cues for positioning WC for transfer as well as managing leg rests. Pt completes sit to supine with supervision, cues for positioning towards HOB. Pt remains supine with all needs within reach, call light in place, and bed alarm activated with nurse tech in room at end of session.  Therapy Documentation Precautions:  Precautions Precautions: Fall Precaution Comments: R BKA with wound vac Required Braces or Orthoses: Other Brace Other Brace: limb protector Restrictions Weight Bearing Restrictions: Yes RLE Weight Bearing: Non weight bearing    Therapy/Group: Individual Therapy  Edwin Cap PT, DPT 08/29/2022, 12:10 PM

## 2022-08-29 NOTE — Progress Notes (Addendum)
Occupational Therapy Session Note  Patient Details  Name: Caleb [name] MRN: [id number] Date of Birth: Jun 11, 1950  Today's Date: 08/29/2022 OT Individual Time: 0902-1000 OT Individual Time Calculation (min): 58 min    Short Term Goals: Week 1:  OT Short Term Goal 1 (Week 1): LTG=STG d/t Pt ELOS  Skilled Therapeutic Interventions/Progress Updates:  Session 1:  Pt seen for skilled OT session this am with OT orientee and this OT. Pt requesting until later day session for shower retraining visit. Pt reported significant pain. See below for pain ratings with pain meds administered earlier. Pt required reminder for R limb protector but able to don with S only. Discussed w/c issues for home with sister initially. Pt able to demo 10 ft "hopping" with min A fading to CGA to w/c from bed with RW using L ortho off loading shoe. OT pointed out pt could lock w/c outside bathroom and hop in to TTB or toilet. Pt able to demonstrate understanding but may need simulation in demo ADL apt to mirror actual bathroom doorway. Pt able to self propel to day room gym from room with S ~ 75 ft then OT assisted with transport back for time mngt. Pt transferred on and off mat via SPT with RW with CGA. Supine and long sitting for 10 min interval of mirror therapy with mod fading to min cues to run through multiple LE movements. OT replaced low profile cushion with higher profile gel/foam blend cushion to align with new [employer] amputee support pad demo. Pt agreed that new seating and R LE positioning offered much greater support. OT transported pt back to room for time mngt. Pain significantly decreased from initially. See below. Transferred back to bed via SPT with bed rail support with CGA. Left with bed alarm engaged, needs and nurse call button in reach.    Pain:  9/10 pain R LE phantom pain reduced to 7/10 with mirror therapy, new positioning device for R residual limb in w/c with + results  Therapy  Documentation Precautions:  Precautions Precautions: Fall Precaution Comments: R BKA with wound vac Required Braces or Orthoses: Other Brace Other Brace: limb protector Restrictions Weight Bearing Restrictions: Yes RLE Weight Bearing: Non weight bearing   Therapy/Group: Individual Therapy  Caleb [name] 08/29/2022, 7:54 AM

## 2022-08-29 NOTE — Progress Notes (Signed)
Occupational Therapy Session Note  Patient Details  Name: Caleb [name] MRN: [id number] Date of Birth: [birth year]  Today's Date: 08/29/2022 OT Individual Time: 4098-1191 OT Individual Time Calculation (min): 51 min    Short Term Goals: Week 1:  OT Short Term Goal 1 (Week 1): LTG=STG d/t Pt ELOS  Skilled Therapeutic Interventions/Progress Updates:      Therapy Documentation Precautions:  Precautions Precautions: Fall Precaution Comments: R BKA with wound vac Required Braces or Orthoses: Other Brace Other Brace: limb protector Restrictions Weight Bearing Restrictions: Yes RLE Weight Bearing: Non weight bearing General:  " [name]." Pt supine in bed upon OT arrival, agreeable to OT session.  Pain: 6/10 pain reported in lower residual limb, therapeutic discussion, positioning, distraction and shower provided for pain management   ADL: Bed mobility: SBA supine> <EOB with HOB elevated Eating: set/up for drinking  Oral hygiene: set-up bed level  UB dressing: distant supervision seated in W/C for overhead shirt LB dressing: CGA standing at RW/sitting, sitting to thread pants/underwear onto legs, standing to manage over hips, VC to stabilize with one hand on the RW and other managing pants over hips  Footwear: SBA with increased time required for donning grip sock/shoe d/t decreased sensation in hands from neuropathy, Mod A to don shrinker, does not need assist to doff. Shower transfer: CGA with use of grab bars for stand pivot transfer W/C>< drop arm bariatric commode in shower, no SOB/LOB Bathing: distant supervision, lateral leaning for peri area cleaning, able to wash/rinse/dry all body parts, residual limb covered during bathing Transfers: -sit to stand/stand to sit CGA W/C><RW, EOB><RW -pt "hopped" with RW CGA with no LOB/SOB during functional mobility, pt "hopped ~20 feet to bathroom from bed  Education: Pt educated on skin inspections for residual limb. Pt issued skin  inspection mirror for self monitoring of residual limb and other body parts to prevent wounds and skin care issues. Pt educated on purpose of shrinker and Teacher, music. Pt educated on wearing schedule for shrinker.    Pt supine in bed with bed alarm activated, 2 bed rails up, call light within reach and 4Ps assessed.   Therapy/Group: Individual Therapy  Velia Meyer, OTD, OTR/L 08/29/2022, 1:01 PM

## 2022-08-29 NOTE — Progress Notes (Signed)
PROGRESS NOTE   Subjective/Complaints: Reports pain is improved today. Reports he sleeps ok but often likes to stay up to late. No new concerns.   ROS: + Phantom limb pain-improved Pt denies SOB, cough, abd pain, CP, N/V/C/D  Except for HPI Objective:   No results found. Recent Labs    08/29/22 0644  WBC 7.3  HGB 8.5*  HCT 27.7*  PLT 527*    Recent Labs    08/29/22 0644  NA 131*  K 4.9  CL 96*  CO2 24  GLUCOSE 171*  BUN 77*  CREATININE 1.22  CALCIUM 9.1     Intake/Output Summary (Last 24 hours) at 08/29/2022 0815 Last data filed at 08/29/2022 0743 Gross per 24 hour  Intake 835 ml  Output 3450 ml  Net -2615 ml         Physical Exam: Vital Signs Blood pressure 112/77, pulse (!) 101, temperature 98.3 F (36.8 C), temperature source Oral, resp. rate 17, height 5' 6.93" (1.7 m), weight 73.5 kg, SpO2 99 %.  General: No acute distress.  Laying in bed. Mood and affect are appropriate Heart: RRR Lungs: Clear to auscultation, breathing unlabored, no rales or wheezes. No increased WOB Abdomen: Positive bowel sounds, soft nontender to palpation, nondistended Skin: Surgical site sutures intact, well approximated, mild serosangionous drainage from 2 eraser sized areas on middle incision line. No erythema. Shaping well-6/9 covered in shrinker   Musculoskeletal:        No joint swelling noted    Neurological:     General: No focal deficit present.     Mental Status: He is alert and oriented to person, place, and time.  Psychiatric:        Mood and Affect: Mood normal.        Behavior: Behavior normal.   Motor 4/5 Bilateral grip 5/5 in bialteral delt , Bi, Tri, Hip flexors left knee ext, 3- Left ankle DF PF Sensation absent to LT below the midcalf  Left foot and bilateral hand intrinsice atrophy  MSK- Left foot with 4th and 5th toe amps well healed   Assessment/Plan: 1. Functional deficits which require  3+ hours per day of interdisciplinary therapy in a comprehensive inpatient rehab setting. Physiatrist is providing close team supervision and 24 hour management of active medical problems listed below. Physiatrist and rehab team continue to assess barriers to discharge/monitor patient progress toward functional and medical goals  Care Tool:  Bathing    Body parts bathed by patient: Right arm, Left arm, Chest, Abdomen, Front perineal area, Buttocks, Face, Left lower leg, Left upper leg, Right upper leg     Body parts n/a: Right lower leg   Bathing assist Assist Level: Contact Guard/Touching assist     Upper Body Dressing/Undressing Upper body dressing   What is the patient wearing?: Pull over shirt    Upper body assist Assist Level: Supervision/Verbal cueing    Lower Body Dressing/Undressing Lower body dressing      What is the patient wearing?: Pants, Underwear/pull up     Lower body assist Assist for lower body dressing: Moderate Assistance - Patient 50 - 74%     Toileting Toileting  Toileting assist Assist for toileting: Minimal Assistance - Patient > 75%     Transfers Chair/bed transfer  Transfers assist     Chair/bed transfer assist level: Minimal Assistance - Patient > 75%     Locomotion Ambulation   Ambulation assist      Assist level: Minimal Assistance - Patient > 75% Assistive device: Walker-rolling Max distance: 58ft   Walk 10 feet activity   Assist     Assist level: Minimal Assistance - Patient > 75% Assistive device: Walker-rolling   Walk 50 feet activity   Assist Walk 50 feet with 2 turns activity did not occur: Safety/medical concerns (pain)         Walk 150 feet activity   Assist Walk 150 feet activity did not occur: Safety/medical concerns (pain)         Walk 10 feet on uneven surface  activity   Assist Walk 10 feet on uneven surfaces activity did not occur: Safety/medical concerns (pain)          Wheelchair     Assist Is the patient using a wheelchair?: Yes Type of Wheelchair: Manual    Wheelchair assist level: Supervision/Verbal cueing Max wheelchair distance: 173ft    Wheelchair 50 feet with 2 turns activity    Assist        Assist Level: Supervision/Verbal cueing   Wheelchair 150 feet activity     Assist      Assist Level: Supervision/Verbal cueing   Blood pressure 112/77, pulse (!) 101, temperature 98.3 F (36.8 C), temperature source Oral, resp. rate 17, height 5' 6.93" (1.7 m), weight 73.5 kg, SpO2 99 %.  Medical Problem List and Plan: 1. Functional deficits secondary to Right BKA secondary to osteomyelitis RIght foot             -patient may  shower since wound vac is removed             -ELOS/Goals: 7-10d Sup/Mod I goals   Con't CIR PT and OT- ~ 2 weeks  -team conference tomorrow  2.  Antithrombotics: -DVT/anticoagulation:  Pharmaceutical: Eliquis             -antiplatelet therapy: aspirin 81 mg daily   3. Pain Management: Tylenol as needed             -continue gabapentin 300 mg TID   6/4- on Norco as needed q4 hours prn for pain- is working so far  6/5- piriformis syndrome- will start stretching and tennis ball usage; Also change Norco to Dilaudid since don't want to increase Norco due to tylenol- and cannot tolerate Oxy- 2 mg q4 hours prn  6/6- will d/w therapy about piriformis stretches- pain doing better on Dliaudid than Norco- better controlled  6/8: Discussed massage for phangom limb pains. Overall better.   6/9: See below; increase gabapentin to 300/300/600 mg  6/10 phantom pain improved, continue current regimen  4. Mood/Behavior/Sleep: LCSW to evaluate and provide emotional support             -antipsychotic agents: n/a   5. Neuropsych/cognition: This patient is capable of making decisions on his own behalf.   6. Skin/Wound Care: Routine skin care checks             -remove Prevena in one week   6/6- due to be removed  tomorrow- however since having new R distal/near incision pain- will ask Dr Lajoyce Corners if can remove today - removed  7. Fluids/Electrolytes/Nutrition: Routine Is and Os and follow-up chemistries             -  continue vitamin C, Nepro, Juven, zinc   8: Hypertension: monitor TID and prn; Lasix and hydralazine held due to soft BP             -continue carvedilol 3.125 mg BID             -continue Imdur 15 mg daily  6/10 well controlled, continue current     08/29/2022    1:14 PM 08/29/2022    5:00 AM 08/29/2022    4:18 AM  Vitals with BMI  Weight  162 lbs 1 oz   BMI  25.43   Systolic 120  112  Diastolic 76  77  Pulse 93  101    9: Hyperlipidemia: continue statin   10: DM-2: A1c = 7.9%; CBGs QID; home regimen NPH 70/30 8 units before breakfast and 7 units before supper not restarted             -continue SSI   6/4- Will restart insulin once CBGs a little higher- right now mainly mid 100s- with 1 value 211- I'm concerned about starting insulin at thiese levels- don't want hypoglycemia  6/6- CBGs 153-184 in last 24 hours- will restart 70/30- because he wants home medicine used- 4 units BID-meals and monitor trend  6/10 well controlled overall, continue regimen  CBG (last 3)  Recent Labs    08/28/22 1651 08/28/22 2101 08/29/22 0607  GLUCAP 114* 152* 180*    Increase 70/30 to 5U BID 6/7 ( will likely need to titrate up, has been receiving 6-7U novalog per day with SSI) 11: HFrEF: current EF ~25-30%; Lasix and hydralazine on hold             -daily weight , monitor for fluid overload             -continue Imdur 15 mg daily             -continue Carvedilol 3.125 mg BID   6/4- Weight down 1 kg- will monitor daily-   6/6- no weight in 2 days-   6/8-9: Downtrending   6/10 Wt  still downtrending, continue to monitor  Filed Weights   08/27/22 0500 08/28/22 0437 08/29/22 0500  Weight: 76 kg 74.8 kg 73.5 kg     12: Aortic stenosis s/p TAVR 12/2021   13: History of CAD: Continue statin,  aspirin, Eliquis             -follows with Dr. Allyson Sabal   14: Anemia of chronic disease/ABLA: follow-up CBC   6/4- Hb down to 7.9 from 8.5- wil recheck Thursday 15: CKD stage 3a: currently Scr at baseline>>1.28; follow-up BMP   6/4- Cr down to 1.14 from 1.28 and Cr 35 up from 31- will recheck Thursday   6/10 - Cr stable at 1.22, BUN up to 77, continue to hold lasix , recheck wednesday  16: PAD: continue statin, aspirin, Eliquis   17: Hyponatremia; chronic: Lasix held; follow-up BMP   6/4- Na 131- on high side for him- will monitor    Latest Ref Rng & Units 08/29/2022    6:44 AM 08/25/2022   10:18 AM 08/23/2022    6:06 AM  BMP  Glucose 70 - 99 mg/dL 161  096  045   BUN 8 - 23 mg/dL 77  40  35   Creatinine 0.61 - 1.24 mg/dL 4.09  8.11  9.14   Sodium 135 - 145 mmol/L 131  132  131   Potassium 3.5 - 5.1 mmol/L 4.9  4.3  4.7   Chloride 98 - 111 mmol/L 96  99  97   CO2 22 - 32 mmol/L 24  24  25    Calcium 8.9 - 10.3 mg/dL 9.1  8.7  8.5    6/7 stable mild hypoNa+, BUN still up cont hold furosemide  6/10 Na 131, stable overall , recheck Wednesday   18: Leukocytosis: no fever or known infection; follow-up CBC with diff   6/4- WBC down to normal levels- 10k- will monitor 19: Thrombocytosis: upward trend; follow-up CBC   6/4- Plts stable at 559k 20: GERD: continue Protonix   21: Elevated transaminases: improved; follow-up CMP   AST very slightly elevated at 42- but otherwise resolved  22: Diabetic neuropathy: continue gabapentin 300 mg TID   - 6/9: Last 2 Cr WNL; given ongoing phantom pains QHS increase gabapentin to 300/300/600 mg  23. Constipation  6/5- will give Sorbitol 30cc since no BM in 3 days except very small one last night  6/6- had good BM's.   24. R forearm- IV infiltration  6/6- will order warm compresses for R forearm   LOS: 7 days A FACE TO FACE EVALUATION WAS PERFORMED  Fanny Dance 08/29/2022, 8:15 AM

## 2022-08-30 DIAGNOSIS — K5900 Constipation, unspecified: Secondary | ICD-10-CM | POA: Diagnosis not present

## 2022-08-30 DIAGNOSIS — S88111A Complete traumatic amputation at level between knee and ankle, right lower leg, initial encounter: Secondary | ICD-10-CM | POA: Diagnosis not present

## 2022-08-30 DIAGNOSIS — G548 Other nerve root and plexus disorders: Secondary | ICD-10-CM | POA: Diagnosis not present

## 2022-08-30 DIAGNOSIS — I5022 Chronic systolic (congestive) heart failure: Secondary | ICD-10-CM | POA: Diagnosis not present

## 2022-08-30 DIAGNOSIS — Z794 Long term (current) use of insulin: Secondary | ICD-10-CM

## 2022-08-30 DIAGNOSIS — E1122 Type 2 diabetes mellitus with diabetic chronic kidney disease: Secondary | ICD-10-CM

## 2022-08-30 LAB — GLUCOSE, CAPILLARY
Glucose-Capillary: 131 mg/dL — ABNORMAL HIGH (ref 70–99)
Glucose-Capillary: 182 mg/dL — ABNORMAL HIGH (ref 70–99)
Glucose-Capillary: 184 mg/dL — ABNORMAL HIGH (ref 70–99)
Glucose-Capillary: 178 mg/dL — ABNORMAL HIGH (ref 70–99)

## 2022-08-30 MED ORDER — GLUCERNA SHAKE PO LIQD
237.0000 mL | Freq: Three times a day (TID) | ORAL | Status: DC
  Administered 2022-08-30 – 2022-09-02 (×9): 237 mL via ORAL

## 2022-08-30 MED ORDER — SORBITOL 70 % SOLN: 30.0000 mL | Freq: Once | Status: DC

## 2022-08-30 MED ORDER — POLYETHYLENE GLYCOL 3350 17 G PO PACK: 17.0000 g | PACK | Freq: Every day | ORAL | Status: DC | PRN

## 2022-08-30 NOTE — Progress Notes (Signed)
Occupational Therapy Session Note  Patient Details  Name: Caleb [name] MRN: [id number] Date of Birth: [birth year]  Today's Date: 08/30/2022 OT Individual Time: 3244-0102 OT Individual Time Calculation (min): 55 min    Short Term Goals: Week 1:  OT Short Term Goal 1 (Week 1): LTG=STG d/t Pt ELOS  Skilled Therapeutic Interventions/Progress Updates:     Therapy Documentation Precautions:  Precautions Precautions: Fall Precaution Comments: R BKA with wound vac Required Braces or Orthoses: Other Brace Other Brace: limb protector Restrictions Weight Bearing Restrictions: Yes RLE Weight Bearing: Non weight bearing General:  "You worked me today." Pt supine in bed upon OT arrival, agreeable to OT session. Limb guard donned prior to session.  Pain: 3/10 pain reported upon arrival  W/C mobility: Pt propelled self from room>< therapy gym with SBA with BUE   Bed mobility: SBA supine>< EOB with HOB elevated, multiple trials completed this date on bed and mat table all SBA overall  Transfers: CGA emerging SBA squat pivot bed>< W/C and EOM>< W/C   Therapeutic Exercises: Pt completed the following exercise circuit in order to improve functional activity, strength and endurance to prepare for ADLs such as bathing. Pt completed the following exercises in seated position EOM with 5# dowel rod noted LOB/SOB and 20 repetitions on each exercise: -chest press 3x20  -scapular retraction 3x10 with dowel rod pulling yellow theraband  -W/C tricep push ups 3X10 -squat pivot transfer 10X with only 1 hop CGA W/C>< mat table -modified sit ups holding 5# dowel rod 3X10  PRN ~1-2 min rest breaks between each exercise.   Mirror therapy:  Pt completed the following exercises with mirror placed for mental imagery to decrease phantom limb pain, pt reports decrease in pain with consistent mirror therapy. Pt completed seated on mat table: -ankle flexion/extension 20X -ankle circles 20X -internal external  rotation 20X -hip flexion/extension with knee flexion/extension 20X   Doffed limb guard upon lying in bed and provided correct positioning for residual limb. Pt supine in bed with bed alarm activated, 2 bed rails up, call light within reach and 4Ps assessed.  Therapy/Group: Individual Therapy  Velia Meyer, OTD, OTR/L 08/30/2022, 11:26 AM

## 2022-08-30 NOTE — Progress Notes (Addendum)
PROGRESS NOTE   Subjective/Complaints: Reports pain is improved today. No phantom pain this AM.  Reports BM this AM.  No new concerns.   ROS: + Phantom limb pain-improved Pt denies fever, chills, SOB, cough, abd pain, CP, N/V/C/D  Except for HPI Objective:   No results found. Recent Labs    08/29/22 0644  WBC 7.3  HGB 8.5*  HCT 27.7*  PLT 527*    Recent Labs    08/29/22 0644  NA 131*  K 4.9  CL 96*  CO2 24  GLUCOSE 171*  BUN 77*  CREATININE 1.22  CALCIUM 9.1     Intake/Output Summary (Last 24 hours) at 08/30/2022 1201 Last data filed at 08/30/2022 0800 Gross per 24 hour  Intake 354 ml  Output 1300 ml  Net -946 ml         Physical Exam: Vital Signs Blood pressure 114/67, pulse 92, temperature 97.6 F (36.4 C), temperature source Oral, resp. rate 16, height 5' 6.93" (1.7 m), weight 71.3 kg, SpO2 93 %.  General: No acute distress.  Laying in bed. Mood and affect are appropriate Heart: RRR Lungs: Clear to auscultation, breathing unlabored, no rales or wheezes. No increased WOB Abdomen: Positive bowel sounds, soft nontender to palpation, nondistended Skin: Surgical site sutures intact, well approximated, no significant drainage   Musculoskeletal:        No joint swelling noted    Neurological:     General: No focal deficit present.     Mental Status: He is alert and oriented to person, place, and time.  Psychiatric:        Mood and Affect: Mood normal.        Behavior: Behavior normal.   Motor 4/5 Bilateral grip 5/5 in bialteral delt , Bi, Tri, Hip flexors left knee ext, 3- Left ankle DF PF Sensation absent to LT below the midcalf  Left foot and bilateral hand intrinsice atrophy  MSK- Left foot with 4th and 5th toe amps well healed   \    Assessment/Plan: 1. Functional deficits which require 3+ hours per day of interdisciplinary therapy in a comprehensive inpatient rehab  setting. Physiatrist is providing close team supervision and 24 hour management of active medical problems listed below. Physiatrist and rehab team continue to assess barriers to discharge/monitor patient progress toward functional and medical goals  Care Tool:  Bathing    Body parts bathed by patient: Right arm, Left arm, Chest, Abdomen, Front perineal area, Buttocks, Face, Left lower leg, Left upper leg, Right upper leg     Body parts n/a: Right lower leg   Bathing assist Assist Level: Supervision/Verbal cueing     Upper Body Dressing/Undressing Upper body dressing   What is the patient wearing?: Pull over shirt    Upper body assist Assist Level: Supervision/Verbal cueing    Lower Body Dressing/Undressing Lower body dressing      What is the patient wearing?: Pants, Underwear/pull up     Lower body assist Assist for lower body dressing: Contact Guard/Touching assist     Toileting Toileting    Toileting assist Assist for toileting: Contact Guard/Touching assist     Transfers Chair/bed transfer  Transfers  assist     Chair/bed transfer assist level: Contact Guard/Touching assist     Locomotion Ambulation   Ambulation assist      Assist level: Minimal Assistance - Patient > 75% Assistive device: Walker-rolling Max distance: 24ft   Walk 10 feet activity   Assist     Assist level: Minimal Assistance - Patient > 75% Assistive device: Walker-rolling   Walk 50 feet activity   Assist Walk 50 feet with 2 turns activity did not occur: Safety/medical concerns (pain)         Walk 150 feet activity   Assist Walk 150 feet activity did not occur: Safety/medical concerns (pain)         Walk 10 feet on uneven surface  activity   Assist Walk 10 feet on uneven surfaces activity did not occur: Safety/medical concerns (pain)         Wheelchair     Assist Is the patient using a wheelchair?: Yes Type of Wheelchair: Manual    Wheelchair  assist level: Supervision/Verbal cueing Max wheelchair distance: 149ft    Wheelchair 50 feet with 2 turns activity    Assist        Assist Level: Supervision/Verbal cueing   Wheelchair 150 feet activity     Assist      Assist Level: Supervision/Verbal cueing   Blood pressure 114/67, pulse 92, temperature 97.6 F (36.4 C), temperature source Oral, resp. rate 16, height 5' 6.93" (1.7 m), weight 71.3 kg, SpO2 93 %.  Medical Problem List and Plan: 1. Functional deficits secondary to Right BKA secondary to osteomyelitis RIght foot             -patient may  shower since wound vac is removed             -ELOS/Goals: 7-10d Sup/Mod I goals   Con't CIR PT and OT- ~ 2 weeks  -Team conference today please see physician documentation under team conference tab, met with team  to discuss problems,progress, and goals. Formulized individual treatment plan based on medical history, underlying problem and comorbidities.    2.  Antithrombotics: -DVT/anticoagulation:  Pharmaceutical: Eliquis             -antiplatelet therapy: aspirin 81 mg daily   3. Pain Management: Tylenol as needed             -continue gabapentin 300 mg TID   6/4- on Norco as needed q4 hours prn for pain- is working so far  6/5- piriformis syndrome- will start stretching and tennis ball usage; Also change Norco to Dilaudid since don't want to increase Norco due to tylenol- and cannot tolerate Oxy- 2 mg q4 hours prn  6/6- will d/w therapy about piriformis stretches- pain doing better on Dliaudid than Norco- better controlled  6/8: Discussed massage for phangom limb pains. Overall better.   6/9: See below; increase gabapentin to 300/300/600 mg  6/10 phantom pain improved, continue current regimen  6/11 reports no phantom pain today, continue to monitor  4. Mood/Behavior/Sleep: LCSW to evaluate and provide emotional support             -antipsychotic agents: n/a   5. Neuropsych/cognition: This patient is capable of  making decisions on his own behalf.   6. Skin/Wound Care: Routine skin care checks             -remove Prevena in one week   6/6- due to be removed tomorrow- however since having new R distal/near incision pain- will ask Dr Lajoyce Corners  if can remove today - removed  7. Fluids/Electrolytes/Nutrition: Routine Is and Os and follow-up chemistries             -continue vitamin C, Nepro, Juven, zinc   8: Hypertension: monitor TID and prn; Lasix and hydralazine held due to soft BP             -continue carvedilol 3.125 mg BID             -continue Imdur 15 mg daily  6/11 well controlled, continue current regimen     08/30/2022    9:21 AM 08/30/2022    7:40 AM 08/30/2022    6:45 AM  Vitals with BMI  Weight   157 lbs 3 oz  BMI   24.67  Systolic  114   Diastolic  67   Pulse 92 90     9: Hyperlipidemia: continue statin   10: DM-2: A1c = 7.9%; CBGs QID; home regimen NPH 70/30 8 units before breakfast and 7 units before supper not restarted             -continue SSI   6/4- Will restart insulin once CBGs a little higher- right now mainly mid 100s- with 1 value 211- I'm concerned about starting insulin at thiese levels- don't want hypoglycemia  6/6- CBGs 153-184 in last 24 hours- will restart 70/30- because he wants home medicine used- 4 units BID-meals and monitor trend  6/11 controlled, continue current medications  CBG (last 3)  Recent Labs    08/29/22 2103 08/30/22 0614 08/30/22 1130  GLUCAP 163* 131* 182*    Increase 70/30 to 5U BID 6/7 ( will likely need to titrate up, has been receiving 6-7U novalog per day with SSI) 11: HFrEF: current EF ~25-30%; Lasix and hydralazine on hold             -daily weight , monitor for fluid overload             -continue Imdur 15 mg daily             -continue Carvedilol 3.125 mg BID   6/4- Weight down 1 kg- will monitor daily-   6/6- no weight in 2 days-   6/8-9: Downtrending   6/11 wt downtrending Filed Weights   08/28/22 0437 08/29/22 0500 08/30/22  0645  Weight: 74.8 kg 73.5 kg 71.3 kg     12: Aortic stenosis s/p TAVR 12/2021   13: History of CAD: Continue statin, aspirin, Eliquis             -follows with Dr. Allyson Sabal   14: Anemia of chronic disease/ABLA: follow-up CBC   6/4- Hb down to 7.9 from 8.5- wil recheck Thursday 15: CKD stage 3a: currently Scr at baseline>>1.28; follow-up BMP   6/4- Cr down to 1.14 from 1.28 and Cr 35 up from 31- will recheck Thursday   6/10 - Cr stable at 1.22, BUN up to 77, continue to hold lasix , recheck wednesday  16: PAD: continue statin, aspirin, Eliquis   17: Hyponatremia; chronic: Lasix held; follow-up BMP   6/4- Na 131- on high side for him- will monitor    Latest Ref Rng & Units 08/29/2022    6:44 AM 08/25/2022   10:18 AM 08/23/2022    6:06 AM  BMP  Glucose 70 - 99 mg/dL 811  914  782   BUN 8 - 23 mg/dL 77  40  35   Creatinine 0.61 - 1.24 mg/dL 9.56  2.13  0.86  Sodium 135 - 145 mmol/L 131  132  131   Potassium 3.5 - 5.1 mmol/L 4.9  4.3  4.7   Chloride 98 - 111 mmol/L 96  99  97   CO2 22 - 32 mmol/L 24  24  25    Calcium 8.9 - 10.3 mg/dL 9.1  8.7  8.5    6/7 stable mild hypoNa+, BUN still up cont hold furosemide  6/10 Na 131, stable overall , recheck Wednesday   18: Leukocytosis: no fever or known infection; follow-up CBC with diff   6/4- WBC down to normal levels- 10k- will monitor 19: Thrombocytosis: upward trend; follow-up CBC   6/4- Plts stable at 559k 20: GERD: continue Protonix   21: Elevated transaminases: improved; follow-up CMP   AST very slightly elevated at 42- but otherwise resolved  22: Diabetic neuropathy: continue gabapentin 300 mg TID   - 6/9: Last 2 Cr WNL; given ongoing phantom pains QHS increase gabapentin to 300/300/600 mg  23. Constipation  6/5- will give Sorbitol 30cc since no BM in 3 days except very small one last night  6/6- had good BM's.   6/11 reports large BM yesterday, will add PRN miralax  24. R forearm- IV infiltration  6/6- will order warm  compresses for R forearm   LOS: 8 days A FACE TO FACE EVALUATION WAS PERFORMED  Fanny Dance 08/30/2022, 12:01 PM

## 2022-08-30 NOTE — Progress Notes (Signed)
Initial Nutrition Assessment  DOCUMENTATION CODES:   Not applicable  INTERVENTION:  - Add Glucerna Shake po TID, each supplement provides 220 kcal and 10 grams of protein  - Continue -1 packet Juven BID, each packet provides 95 calories, 2.5 grams of protein (collagen), and 9.8 grams of carbohydrate (3 grams sugar); also contains 7 grams of L-arginine and L-glutamine, 300 mg vitamin C, 15 mg vitamin E, 1.2 mcg vitamin B-12, 9.5 mg zinc, 200 mg calcium, and 1.5 g  Calcium Beta-hydroxy-Beta-methylbutyrate to support wound healing  NUTRITION DIAGNOSIS:   Increased nutrient needs related to wound healing as evidenced by estimated needs.  GOAL:   Patient will meet greater than or equal to 90% of their needs  MONITOR:   PO intake, Supplement acceptance  REASON FOR ASSESSMENT:   Consult Assessment of nutrition requirement/status  ASSESSMENT:   72 y.o. male admits to CIR related to functional deficits secondary to osteomyelitis and gangrene of right foot s/p R BKA. PMH includes: CHF, DM, dyslipidemia, GERD, HLD, HTN, pancreatitis.  Meds reviewed:  Vit C, lipitor, colace, sliding scale insulin, juven, zinc sulfate. Labs reviewed: Na low, BUN high.   The pt reports that he has a great appetite and has been eating well. He does report that he would like more seasonings with his meals. RD will add a note to provide salt and pepper with trays. Pt also reports that he would like protein shakes. RD will add Glucerna with meals and continue to monitor PO intakes.   NUTRITION - FOCUSED PHYSICAL EXAM:  WDL - no wasting noted.   Diet Order:   Diet Order             Diet Carb Modified Fluid consistency: Thin; Room service appropriate? Yes  Diet effective now                   EDUCATION NEEDS:   Not appropriate for education at this time  Skin:  Skin Assessment: Skin Integrity Issues: Skin Integrity Issues:: Incisions Incisions: R knee  Last BM:  08/29/22  Height:   Ht  Readings from Last 1 Encounters:  08/22/22 5' 6.93" (1.7 m)    Weight:   Wt Readings from Last 1 Encounters:  08/30/22 71.3 kg    Ideal Body Weight:     BMI:  Body mass index is 24.67 kg/m.  Estimated Nutritional Needs:   Kcal:  2200-2400 kcals  Protein:  110-130 gm  Fluid:  >/= 2 L  Bethann Humble, RD, LDN, CNSC.

## 2022-08-30 NOTE — Progress Notes (Signed)
Physical Therapy Weekly Progress Note  Patient Details  Name: Caleb [name] MRN: [id number] Date of Birth: [birth year]  Beginning of progress report period: August 23, 2022 End of progress report period: August 30, 2022  Today's Date: 08/30/2022 PT Individual Time: 8657-8469, 6295-2841  PT Individual Time Calculation (min): 73 min , 83 min   Patient making limited progress to long term goals 2/2 phantom limb pain, decreased strength and activity tolerance. Pt largely requires supervision for bed mobility, CGA-min A with transfers and min with gait ~22 ft and (S) for w/c propulsion ~1,000. Plan to continue pt and family education to prepare for discharge.   Patient continues to demonstrate the following deficits muscle weakness, decreased cardiorespiratoy endurance, and decreased standing balance and decreased postural control and therefore will continue to benefit from skilled PT intervention to increase functional independence with mobility.  Patient progressing toward long term goals..  Continue plan of care.  PT Short Term Goals Week 1:  PT Short Term Goal 1 (Week 1): STG=LTG due to LOS PT Short Term Goal 1 - Progress (Week 1): Progressing toward goal Week 2:  PT Short Term Goal 1 (Week 2): STG=LTG's due to ELOS  Skilled Therapeutic Interventions/Progress Updates:      Therapy Documentation Precautions:  Precautions Precautions: Fall Precaution Comments: R BKA with wound vac Required Braces or Orthoses: Other Brace Other Brace: limb protector Restrictions Weight Bearing Restrictions: Yes RLE Weight Bearing: Non weight bearing  Treatment Session 1:   Pt received semi-reclined in bed and reports pain is "low" compared to previous days, provided rest/repositioning for relief throughout session.   Pt (S) for bed mobility and requires min A for donning of limb guard. Pt (S) for ambulatory transfer with RW to w/c and for w/c propulsion ~200 ft to main gym.   Pt agreeable to practice  stair navigation x 3 with shower chair technique with min A. Pt able to stand with adequate technique while performing stairs.   Pt transitioned to blocked practice of squat pivot transfers after pt required min A for stand pivot due to posterior loss of balance. Pt requires demonstration and then performed 4 squat pivot transfers with supervision with min verbal cues for sequencing.   Pt performed following supine exercises to address L LE strength deficits with min verbal cues for technique:   -L LE single leg bridges 3 x 8, 3 second holds   -L LE modified leg press 1 x 6   Pt (S) with squat pivot to w/c and dependently transported for time management to room. Pt left semi-reclined in bed with all needs in reach and alarm on.   Treatment Session 2:   Pt received semi-reclined in bed and reports fatigue but willingness to participate in PT session. Pt (S) for bed mobility and squat pivot to w/c. Pt dependently transported for time management and energy conservation to main gym.   Pt requires min A for w/c set-up and management of leg rest/amputee pad with squat pivot transfer (S) to mat. Pt transitioned to prone positioning for flexibliy training of hip flexors ~ 4 minutes.   Pt performed following mat exercises for LE strength:   -prone right hip extension 3 x 10   -side lying right hip abduction 3 x 10   -yoga block push up's 3 x 10   PT provided pt education and facilitated peer support regarding prosthetics.   Pt dependently transported to room for time management and energy conservation and left semi-reclined in bed  with all needs in reach and alarm on.   Pt with unrated phantom pain that improved with repositioning throughout session.    Therapy/Group: Individual Therapy  Truitt Leep Truitt Leep PT, DPT  08/30/2022, 7:19 AM

## 2022-08-31 DIAGNOSIS — G548 Other nerve root and plexus disorders: Secondary | ICD-10-CM | POA: Diagnosis not present

## 2022-08-31 DIAGNOSIS — S88111A Complete traumatic amputation at level between knee and ankle, right lower leg, initial encounter: Secondary | ICD-10-CM | POA: Diagnosis not present

## 2022-08-31 DIAGNOSIS — I5022 Chronic systolic (congestive) heart failure: Secondary | ICD-10-CM | POA: Diagnosis not present

## 2022-08-31 DIAGNOSIS — E1122 Type 2 diabetes mellitus with diabetic chronic kidney disease: Secondary | ICD-10-CM | POA: Diagnosis not present

## 2022-08-31 LAB — GLUCOSE, CAPILLARY
Glucose-Capillary: 153 mg/dL — ABNORMAL HIGH (ref 70–99)
Glucose-Capillary: 124 mg/dL — ABNORMAL HIGH (ref 70–99)
Glucose-Capillary: 117 mg/dL — ABNORMAL HIGH (ref 70–99)
Glucose-Capillary: 178 mg/dL — ABNORMAL HIGH (ref 70–99)

## 2022-08-31 LAB — BASIC METABOLIC PANEL
Anion gap: 8 (ref 5–15)
BUN: 64 mg/dL — ABNORMAL HIGH (ref 8–23)
CO2: 26 mmol/L (ref 22–32)
Calcium: 9.1 mg/dL (ref 8.9–10.3)
Chloride: 98 mmol/L (ref 98–111)
Creatinine, Ser: 1.3 mg/dL — ABNORMAL HIGH (ref 0.61–1.24)
GFR, Estimated: 58 mL/min — ABNORMAL LOW (ref >60)
Glucose, Bld: 169 mg/dL — ABNORMAL HIGH (ref 70–99)
Potassium: 5 mmol/L (ref 3.5–5.1)
Sodium: 132 mmol/L — ABNORMAL LOW (ref 135–145)

## 2022-08-31 MED ORDER — POLYETHYLENE GLYCOL 3350 17 G PO PACK
17.0000 g | PACK | Freq: Every day | ORAL | Status: DC
  Administered 2022-08-31 – 2022-09-05 (×6): 17 g via ORAL
  Filled 2022-08-31 (×6): qty 1

## 2022-08-31 NOTE — Progress Notes (Signed)
Occupational Therapy Session Note  Patient Details  Name: LINDEN TAGLIAFERRO MRN: [id number] Date of Birth: [birth year]  Today's Date: 08/31/2022 OT Individual Time: 1302-1400 OT Individual Time Calculation (min): 58 min    Short Term Goals: Week 1:  OT Short Term Goal 1 (Week 1): LTG=STG d/t Pt ELOS  Skilled Therapeutic Interventions/Progress Updates:     Therapy Documentation Precautions:  Precautions Precautions: Fall Precaution Comments: R BKA with wound vac Required Braces or Orthoses: Other Brace Other Brace: limb protector Restrictions Weight Bearing Restrictions: Yes RLE Weight Bearing: Non weight bearing  General: "Nice to see you." Pt supine in bed upon OT arrival, agreeable to OT session.  Pain: 0/10 pain reported/noted, nurse medicated pt upon OT arrival.   ADL: Bed mobility: SBA supine>EOB UB dressing: distant supervision in seated position for doffing/donning overhead shirt LB dressing: close supervision for doffing/donning underwear and shorts while stabilizing at grab bars near toilet  Footwear: SBA in seated position doffing/donning, shrinker, limb guard and grip sock on in-tact limb Shower transfer: SBA squat pivot W/C>< shower with use of grab bars  Bathing: distant supervision to wash/rinse/dry all body parts in seated position, lateral leaning for peri care Transfers:  -SBA squat pivot bed>W/C with no device     Other Treatments:   OT and pt removed adhesive residue from body with adhesive remover for increased comfort and quality of life.   Education: Pt and OT discussed bathing concerns for DC. Pt and OT discussed options for bathing 2/2 pt not wanting cousin to help bathe him. OT educated pt on how to cover residual limb for bathing at home. OT re-educated pt on proper shrinker care, completed teach back method with pt with pt demonstrating and verbalizing understanding of correct care. Pt educated not to Art therapist in Education officer, environmental. Pt educated    Pt seated in W/C at end of session with W/C alarm donned, call light within reach and 4Ps assessed.   Therapy/Group: Individual Therapy  Velia Meyer, OTD, OTR/L  08/31/2022, 3:35 PM

## 2022-08-31 NOTE — Progress Notes (Signed)
Patient ID: Caleb [name], male   DOB: Oct 30, 1950, 72 y.o.   MRN: [id number]  Met with pt to give team conference update regarding goals of mod/I and discharge 6/17. He reports some of the DME this worker ordered is being delivered to the apartment office today. Discussed issue of the steps and he reports he has two men who will heave ho up the two steps. He feels comfortable with this and does not plan on purchasing a tub seat for this. He will be staying at his cousin's for two weeks then return home. He feels confident it all will work out until he can get his prothesis. Will reach out to [facility] to give update since they were following prior to admission.

## 2022-08-31 NOTE — Progress Notes (Signed)
Physical Therapy Session Note  Patient Details  Name: Caleb [name] MRN: [id number] Date of Birth: Mar 09, 1951  Today's Date: 08/31/2022 PT Individual Time: 5784-6962, 1445-1530  PT Individual Time Calculation (min): 57 min , 45 min   Short Term Goals: Week 2:  PT Short Term Goal 1 (Week 2): STG=LTG's due to ELOS  Skilled Therapeutic Interventions/Progress Updates:      Therapy Documentation Precautions:  Precautions Precautions: Fall Precaution Comments: R BKA with wound vac Required Braces or Orthoses: Other Brace Other Brace: limb protector Restrictions Weight Bearing Restrictions: Yes RLE Weight Bearing: Non weight bearing  Treatment Session 1:   Pt received semi-reclined in bed, agreeable to PT and with unrated R phantom limb pain that improved with repositioning and rest.   Pt (S) for bed mobility and squat pivot transfer to w/c. PT provided min verbal cues with sequencing and wheelchair set-up for transfer including brake and arm rest management.   Pt (S) for w/c propulsion ~150 ft to main gym and PT reinforced w/c positioning and set-up with verbal cues.   Pt requires CGA- Supervision for dynamic standing balance as pt performed multi-directional reaching for bean bags and tossed with R UE to target (corn hole board). Pt demonstrates improved accuracy with repetition.   Pt transitioned to blocked practice of 3 sit to stand transfers with unilateral support. Pt requires min A for activity and educated on benefits of practicing to simulate toilet transfers.   Pt transported to room dependently by w/c for energy conservation and left semi-reclined in bed with all needs in reach and alarm on.    Treatment Session 2:   Pt received semi-reclined in bed, agreeable to PT and without reports of pain in session.   Pt (S) for w/c mobility and part management >300 ft throughout session hospital room <>dayroom.   Pt engaged in multiple sit to stands and requires min A for  dynamic standing balance while engaging in Wii Bowling with unilateral UE support from RW. Pt requires min verbal cues for posture and safety awareness with transfers.   Pt left semi-reclined in bed with all needs in reach and alarm on.   Therapy/Group: Individual Therapy  Truitt Leep Truitt Leep PT, DPT  08/31/2022, 7:26 AM

## 2022-08-31 NOTE — Progress Notes (Signed)
PROGRESS NOTE   Subjective/Complaints: Denies phantom pain. Reports he is making good progress with therapy. No new concerns this AM.   ROS: + Phantom limb pain-improved Pt denies fever, chills, SOB, cough, abd pain, CP, N/V/D + mild constipation- improved  Except for HPI Objective:   No results found. Recent Labs    08/29/22 0644  WBC 7.3  HGB 8.5*  HCT 27.7*  PLT 527*    Recent Labs    08/29/22 0644 08/31/22 0604  NA 131* 132*  K 4.9 5.0  CL 96* 98  CO2 24 26  GLUCOSE 171* 169*  BUN 77* 64*  CREATININE 1.22 1.30*  CALCIUM 9.1 9.1     Intake/Output Summary (Last 24 hours) at 08/31/2022 0851 Last data filed at 08/31/2022 1610 Gross per 24 hour  Intake 1236 ml  Output 2750 ml  Net -1514 ml         Physical Exam: Vital Signs Blood pressure 113/78, pulse 87, temperature 98.3 F (36.8 C), temperature source Oral, resp. rate 16, height 5' 6.93" (1.7 m), weight 71.7 kg, SpO2 100 %.  General: No acute distress.  Laying in bed. Mood and affect are appropriate Heart: RRR Lungs: Clear to auscultation, breathing unlabored, no rales or wheezes. No increased WOB Abdomen: Positive bowel sounds, soft nontender to palpation, nondistended Skin: RLE with limb protector in place  Musculoskeletal:        No joint swelling noted    Neurological:     General: No focal deficit present.     Mental Status: He is alert and oriented to person, place, and time.  Psychiatric:        Mood and Affect: Mood normal.        Behavior: Behavior normal.   Motor 4/5 Bilateral grip 5/5 in bialteral delt , Bi, Tri, Hip flexors left knee ext, 3- Left ankle DF PF Sensation absent to LT below the midcalf  Left foot and bilateral hand intrinsice atrophy  MSK- Left foot with 4th and 5th toe amps well healed   \    Assessment/Plan: 1. Functional deficits which require 3+ hours per day of interdisciplinary therapy in a  comprehensive inpatient rehab setting. Physiatrist is providing close team supervision and 24 hour management of active medical problems listed below. Physiatrist and rehab team continue to assess barriers to discharge/monitor patient progress toward functional and medical goals  Care Tool:  Bathing    Body parts bathed by patient: Right arm, Left arm, Chest, Abdomen, Front perineal area, Buttocks, Face, Left lower leg, Left upper leg, Right upper leg     Body parts n/a: Right lower leg   Bathing assist Assist Level: Supervision/Verbal cueing     Upper Body Dressing/Undressing Upper body dressing   What is the patient wearing?: Pull over shirt    Upper body assist Assist Level: Supervision/Verbal cueing    Lower Body Dressing/Undressing Lower body dressing      What is the patient wearing?: Pants, Underwear/pull up     Lower body assist Assist for lower body dressing: Contact Guard/Touching assist     Toileting Toileting    Toileting assist Assist for toileting: Contact Guard/Touching assist  Transfers Chair/bed transfer  Transfers assist     Chair/bed transfer assist level: Contact Guard/Touching assist     Locomotion Ambulation   Ambulation assist      Assist level: Minimal Assistance - Patient > 75% Assistive device: Walker-rolling Max distance: 44ft   Walk 10 feet activity   Assist     Assist level: Minimal Assistance - Patient > 75% Assistive device: Walker-rolling   Walk 50 feet activity   Assist Walk 50 feet with 2 turns activity did not occur: Safety/medical concerns (pain)         Walk 150 feet activity   Assist Walk 150 feet activity did not occur: Safety/medical concerns (pain)         Walk 10 feet on uneven surface  activity   Assist Walk 10 feet on uneven surfaces activity did not occur: Safety/medical concerns (pain)         Wheelchair     Assist Is the patient using a wheelchair?: Yes Type of  Wheelchair: Manual    Wheelchair assist level: Supervision/Verbal cueing Max wheelchair distance: 145ft    Wheelchair 50 feet with 2 turns activity    Assist        Assist Level: Supervision/Verbal cueing   Wheelchair 150 feet activity     Assist      Assist Level: Supervision/Verbal cueing   Blood pressure 113/78, pulse 87, temperature 98.3 F (36.8 C), temperature source Oral, resp. rate 16, height 5' 6.93" (1.7 m), weight 71.7 kg, SpO2 100 %.  Medical Problem List and Plan: 1. Functional deficits secondary to Right BKA secondary to osteomyelitis RIght foot             -patient may  shower since wound vac is removed             -ELOS/Goals: 7-10d Sup/Mod I goals   Con't CIR PT and OT- ~ 2 weeks  -Team conference today please see physician documentation under team conference tab, met with team  to discuss problems,progress, and goals. Formulized individual treatment plan based on medical history, underlying problem and comorbidities.    2.  Antithrombotics: -DVT/anticoagulation:  Pharmaceutical: Eliquis             -antiplatelet therapy: aspirin 81 mg daily   3. Pain Management: Tylenol as needed             -continue gabapentin 300 mg TID   6/4- on Norco as needed q4 hours prn for pain- is working so far  6/5- piriformis syndrome- will start stretching and tennis ball usage; Also change Norco to Dilaudid since don't want to increase Norco due to tylenol- and cannot tolerate Oxy- 2 mg q4 hours prn  6/6- will d/w therapy about piriformis stretches- pain doing better on Dliaudid than Norco- better controlled  6/8: Discussed massage for phangom limb pains. Overall better.   6/9: See below; increase gabapentin to 300/300/600 mg  6/10 phantom pain improved, continue current regimen  6/12 no further phantom pain,continue current regimen  4. Mood/Behavior/Sleep: LCSW to evaluate and provide emotional support             -antipsychotic agents: n/a   5.  Neuropsych/cognition: This patient is capable of making decisions on his own behalf.   6. Skin/Wound Care: Routine skin care checks             -remove Prevena in one week   6/6- due to be removed tomorrow- however since having new R distal/near incision pain- will  ask Dr Lajoyce Corners if can remove today - removed  7. Fluids/Electrolytes/Nutrition: Routine Is and Os and follow-up chemistries             -continue vitamin C, Nepro, Juven, zinc   8: Hypertension: monitor TID and prn; Lasix and hydralazine held due to soft BP             -continue carvedilol 3.125 mg BID             -continue Imdur 15 mg daily  6/12 well controlled, continue to monitor     08/31/2022    7:45 AM 08/31/2022    5:00 AM 08/31/2022    4:58 AM  Vitals with BMI  Weight  158 lbs 1 oz   BMI  24.81   Systolic 113  113  Diastolic 78  75  Pulse 87  88    9: Hyperlipidemia: continue statin   10: DM-2: A1c = 7.9%; CBGs QID; home regimen NPH 70/30 8 units before breakfast and 7 units before supper not restarted             -continue SSI   6/4- Will restart insulin once CBGs a little higher- right now mainly mid 100s- with 1 value 211- I'm concerned about starting insulin at thiese levels- don't want hypoglycemia  6/6- CBGs 153-184 in last 24 hours- will restart 70/30- because he wants home medicine used- 4 units BID-meals and monitor trend  6/12 CBGS farily  well controlled, continue to monitor  CBG (last 3)  Recent Labs    08/30/22 2112 08/31/22 0636 08/31/22 1159  GLUCAP 178* 153* 124*   Increase 70/30 to 5U BID 6/7 ( will likely need to titrate up, has been receiving 6-7U novalog per day with SSI) 11: HFrEF: current EF ~25-30%; Lasix and hydralazine on hold             -daily weight , monitor for fluid overload             -continue Imdur 15 mg daily             -continue Carvedilol 3.125 mg BID   6/4- Weight down 1 kg- will monitor daily-   6/6- no weight in 2 days-   6/8-9: Downtrending   6/12 wt stable  continue to monitor Filed Weights   08/29/22 0500 08/30/22 0645 08/31/22 0500  Weight: 73.5 kg 71.3 kg 71.7 kg     12: Aortic stenosis s/p TAVR 12/2021   13: History of CAD: Continue statin, aspirin, Eliquis             -follows with Dr. Allyson Sabal   14: Anemia of chronic disease/ABLA: follow-up CBC   6/4- Hb down to 7.9 from 8.5- wil recheck Thursday  15: CKD stage 3a: currently Scr at baseline>>1.28; follow-up BMP   6/4- Cr down to 1.14 from 1.28 and Cr 35 up from 31- will recheck Thursday   6/10 - Cr stable at 1.22, BUN up to 77, continue to hold lasix , recheck Wednesday  6/12 Cr around baseline, BUN a little improved to 64, continue to hold lasix   16: PAD: continue statin, aspirin, Eliquis   17: Hyponatremia; chronic: Lasix held; follow-up BMP   6/4- Na 131- on high side for him- will monitor     Latest Ref Rng & Units 08/31/2022    6:04 AM 08/29/2022    6:44 AM 08/25/2022   10:18 AM  BMP  Glucose 70 - 99 mg/dL 161  096  213   BUN 8 - 23 mg/dL 64  77  40   Creatinine 0.61 - 1.24 mg/dL 0.45  4.09  8.11   Sodium 135 - 145 mmol/L 132  131  132   Potassium 3.5 - 5.1 mmol/L 5.0  4.9  4.3   Chloride 98 - 111 mmol/L 98  96  99   CO2 22 - 32 mmol/L 26  24  24    Calcium 8.9 - 10.3 mg/dL 9.1  9.1  8.7    6/7 stable mild hypoNa+, BUN still up cont hold furosemide  6/12Na stable at 132, continue to follow   18: Leukocytosis: no fever or known infection; follow-up CBC with diff   6/4- WBC down to normal levels- 10k- will monitor 19: Thrombocytosis: upward trend; follow-up CBC   6/4- Plts stable at 559k 20: GERD: continue Protonix   21: Elevated transaminases: improved; follow-up CMP   AST very slightly elevated at 42- but otherwise resolved  22: Diabetic neuropathy: continue gabapentin 300 mg TID   - 6/9: Last 2 Cr WNL; given ongoing phantom pains QHS increase gabapentin to 300/300/600 mg  23. Constipation  6/5- will give Sorbitol 30cc since no BM in 3 days except very small  one last night  6/6- had good BM's.   6/11 reports large BM yesterday, will add PRN miralax  6/12 schedule miralax   24. R forearm- IV infiltration  6/6- will order warm compresses for R forearm   LOS: 9 days A FACE TO FACE EVALUATION WAS PERFORMED  Fanny Dance 08/31/2022, 8:51 AM

## 2022-08-31 NOTE — Patient Care Conference (Signed)
Inpatient RehabilitationTeam Conference and Plan of Care Update Date: 08/31/2022   Time: 11:55 AM   Patient Name: Caleb [name]      Medical Record Number: [id number]  Date of Birth: 02-Sep-1950 Sex: Male         Room/Bed: 4W22C/4W22C-01 Payor Info: Payor: [employer] / Plan: [facility] / Product Type: *No Product type* /    Admit Date/Time:  08/22/2022  6:11 PM  Primary Diagnosis:  Unilateral complete BKA, right, initial encounter [facility])  Hospital Problems: Principal Problem:   Unilateral complete BKA, right, initial encounter [facility])    Expected Discharge Date: Expected Discharge Date: 09/05/22  Team Members Present: Physician leading conference: Dr. Fanny Dance Social Worker Present: Cecile Sheerer, LCSWA Nurse Present: Vedia Pereyra, RN PT Present: Truitt Leep, PT OT Present: Lou Cal, OT     Current Status/Progress Goal Weekly Team Focus  Bowel/Bladder   This patient is continent with occassional incontinent episodes of bladder, bowel is continent   Regain continence of bladder, maintain continence of bowel   Assess and assist with toileting need QS/PRN, time toileting with patient Q2-4 hrs    Swallow/Nutrition/ Hydration               ADL's   CGA-SBA for ADLs, increased independence with UB and LB dressing and functional transfers. Attempted to self don shrinker with Mod A, able to don/doff limb guard   Mod I   Increase independence with functional transfers and reiterate AE training for skin inspections, increased repitition with donning/doffing shrinker and care associated with it    Mobility   supervision bed mobility, CGA/S squat pivot, CGA/min stand pivot, min gait 22 ft, w/c >1000 ft (S), min stairs x 3 shower chair   Mod I, Supervision gait  phantom limb pain management and independence with transfers    Communication                Safety/Cognition/ Behavioral Observations                Pain   s/p Right BKA rates pain 6-7/10 on pain scale, continue current medications   Pain < or + 4 states patient   QS/PRN assessment of pain andd reassessment per protocol    Skin   s/p Right BKA staples intact, dressing, and shinker in place, no drainage      Assess s/p right incisional site QS/PRN , staples remains intact, Incision CDI with no new draining, dressing changes per orders  , shinker in place      Discharge Planning:  Pt to go to cousin's for two weeks then back to his apartment, needs to be mod/i since cousin works. Working on Armed forces training and education officer from [facility]-orders sent 6/7.   Team Discussion: Right BKA. Increase in phantom pain. Wears shrinker with minimal drainage. Requires Mod A to don shrinker will towards independence. Able to don/doff limb guard independently. Working on AE to assist with skin inspections. CKD stable. BUN increasing. Patient reports that is tired in AM but stays up watching TV until 4am. Daily weight. Was taking insulin at home.  Patient on target to meet rehab goals: yes, working towards goals with a discharge date of 09/05/22  *See Care Plan and progress notes for long and short-term goals.   Revisions to Treatment Plan:  Recheck/Monitor  labs.  Monitor VS Teaching Needs: Medications, safety, self care, transfer training, skin inspections, etc.   Current Barriers to Discharge: Decreased caregiver support  Possible Resolutions to Barriers:  Family education and working with [facility] to get recommended equipment.      Medical Summary Current Status: BKA Right, phantom pain,constipation, CKD, heart failure  Barriers to Discharge: Uncontrolled Pain;Medical stability  Barriers to Discharge Comments: BKA Right, phantom pain,constipation, CKD, heart failure, wiating on equipement Possible Resolutions to [employer] Focus: recheck labs, order laxatives, monitor weight, consider increase gabapentin   Continued Need for Acute Rehabilitation Level of  Care: The patient requires daily medical management by a physician with specialized training in physical medicine and rehabilitation for the following reasons: Direction of a multidisciplinary physical rehabilitation program to maximize functional independence : Yes Medical management of patient stability for increased activity during participation in an intensive rehabilitation regime.: Yes Analysis of laboratory values and/or radiology reports with any subsequent need for medication adjustment and/or medical intervention. : Yes   I attest that I was present, lead the team conference, and concur with the assessment and plan of the team.   Jearld Adjutant 08/31/2022, 8:10 AM

## 2022-08-31 NOTE — Progress Notes (Signed)
Physical Therapy Session Note  Patient Details  Name: Caleb [name] MRN: [id number] Date of Birth: [birth year]  Today's Date: 08/31/2022 PT Individual Time: 4098-1191 PT Individual Time Calculation (min): 45 min   Short Term Goals: Week 1:  PT Short Term Goal 1 (Week 1): STG=LTG due to LOS PT Short Term Goal 1 - Progress (Week 1): Progressing toward goal  Skilled Therapeutic Interventions/Progress Updates:    Pt presents in room in bed, agreeable to PT. Pt denies pain at this time and reports having decreased phantom over the past two days. Session focused on interval gait training to improve gait quality and tolerance to upright mobility. Pt completes squat pivot transfers bilaterally with CGA and sit<>stand to RW with CGA/light min assist.  Pt completes bed mobility supervision, squat pivot from bed to [facility] and manages WC leg rests with supervision only. Pt self propels from room to main gym ~150' with supervision for directional cues only. Pt then ambulates 33', 53', and 18' with RW CGA, good foot clearance and UE weightbearing, decreased step length with fatigue and requires verbal cues for energy conservation and looking forward.  Pt provided with extended seated rest breaks between all gait trials to promote energy conservation and quality with tasks. Pt provided with education during rest breaks about limiting gait with RW to 50' prior to receiving prosthesis to decrease excessive wear on BUEs and LLE as well as promoting optimal gait training with prosthesis with pt verbalizing understanding however requesting to walk further than 50' with final gait trial.  Pt returned to room in [facility] dependently for time management. Pt completes squat pivot transfer WC to bed and sit to supine with distant supervision. Pt remains supine in semi reclined position in bed with all needs within reach, call light in place, and bed alarm activated at end of session.  Therapy Documentation Precautions:   Precautions Precautions: Fall Precaution Comments: R BKA with wound vac Required Braces or Orthoses: Other Brace Other Brace: limb protector Restrictions Weight Bearing Restrictions: Yes RLE Weight Bearing: Non weight bearing    Therapy/Group: Individual Therapy  Edwin Cap PT, DPT 08/31/2022, 12:44 PM

## 2022-09-01 DIAGNOSIS — S88111A Complete traumatic amputation at level between knee and ankle, right lower leg, initial encounter: Secondary | ICD-10-CM | POA: Diagnosis not present

## 2022-09-01 DIAGNOSIS — E1122 Type 2 diabetes mellitus with diabetic chronic kidney disease: Secondary | ICD-10-CM | POA: Diagnosis not present

## 2022-09-01 DIAGNOSIS — I5022 Chronic systolic (congestive) heart failure: Secondary | ICD-10-CM | POA: Diagnosis not present

## 2022-09-01 DIAGNOSIS — G548 Other nerve root and plexus disorders: Secondary | ICD-10-CM | POA: Diagnosis not present

## 2022-09-01 LAB — GLUCOSE, CAPILLARY
Glucose-Capillary: 137 mg/dL — ABNORMAL HIGH (ref 70–99)
Glucose-Capillary: 126 mg/dL — ABNORMAL HIGH (ref 70–99)
Glucose-Capillary: 145 mg/dL — ABNORMAL HIGH (ref 70–99)
Glucose-Capillary: 168 mg/dL — ABNORMAL HIGH (ref 70–99)

## 2022-09-01 MED ORDER — HYDROMORPHONE HCL 2 MG PO TABS
1.0000 mg | ORAL_TABLET | ORAL | Status: DC | PRN
  Administered 2022-09-01 – 2022-09-02 (×2): 2 mg via ORAL
  Filled 2022-09-01 (×2): qty 1

## 2022-09-01 NOTE — Progress Notes (Signed)
Occupational Therapy Session Note  Patient Details  Name: TRASEAN DELIMA MRN: [id number] Date of Birth: Mar 20, 1951  Today's Date: 09/01/2022 OT Missed Time: 60 Minutes Missed Time Reason: Patient fatigue;Patient unwilling/refused to participate without medical reason   Short Term Goals: Week 1:  OT Short Term Goal 1 (Week 1): LTG=STG d/t Pt ELOS  Skilled Therapeutic Interventions/Progress Updates:     Attempted to see this Pt for dance-based group therapy session. Pt reporting fatigue and psychosocial factors impacting motivation to participate. Pt politely refusing to participate in session. Provided maximal therapeutic support and motivation and options for modifications of tasks, however no improvement or willingness to participate. Missed 60 minutes of skilled OT treatment d/t fatigue/pain. Will attempt to make up time as schedule and Pt's status allows.   Therapy Documentation Precautions:  Precautions Precautions: Fall Precaution Comments: R BKA with wound vac Required Braces or Orthoses: Other Brace Other Brace: limb protector Restrictions Weight Bearing Restrictions: No RLE Weight Bearing: Non weight bearing   Therapy/Group: Individual Therapy  Army Fossa 09/01/2022, 5:58 PM

## 2022-09-01 NOTE — Progress Notes (Signed)
Occupational Therapy Session Note  Patient Details  Name: Caleb [name] MRN: [id number] Date of Birth: Aug 03, 1950  Today's Date: 09/01/2022 OT Individual Time: 1301-1400 OT Individual Time Calculation (min): 59 min    Short Term Goals: Week 1:  OT Short Term Goal 1 (Week 1): LTG=STG d/t Pt ELOS  Skilled Therapeutic Interventions/Progress Updates:      Therapy Documentation Precautions:  Precautions Precautions: Fall Precaution Comments: R BKA with wound vac Required Braces or Orthoses: Other Brace Other Brace: limb protector Restrictions Weight Bearing Restrictions: No RLE Weight Bearing: Non weight bearing General:  "Hello." Pt supine in bed upon OT arrival, agreeable to OT session.  Pain: 6/10 pain reported this date. Distraction, empathetic discussion and cool washcloth in waterproof bag provided for pain relief.   Exercises: Pt educated on home exercise program and was able to complete/demonstrate teach back of exercises to OT. Pt instructed to complete 3x10 of each exercise 1 time per day with BUE with blue theraband in seated position. Exercises as follows: -bicep curls -diagonal cross body pulls -shoulder flexion -tricep extension  -external rotation  Pt PRN rest breaks after sets.   Other Treatments:  OT provided extensive education on purpose of pt binder found in room. Pt educated on contents of binder and amputee support groups in the area.    Pt supine in bed upon OT arrival, agreeable to OT session.  Therapy/Group: Individual Therapy  Velia Meyer, OTD, OTR/L 09/01/2022, 4:12 PM

## 2022-09-01 NOTE — Progress Notes (Signed)
Physical Therapy Session Note  Patient Details  Name: Caleb [name] MRN: [id number] Date of Birth: Apr 08, 1950  Today's Date: 09/01/2022 PT Individual Time: 0900-1015 PT Individual Time Calculation (min): 75 min   Short Term Goals: Week 2:  PT Short Term Goal 1 (Week 2): STG=LTG's due to ELOS  Skilled Therapeutic Interventions/Progress Updates:      Therapy Documentation Precautions:  Precautions Precautions: Fall Precaution Comments: R BKA with wound vac Required Braces or Orthoses: Other Brace Other Brace: limb protector Restrictions Weight Bearing Restrictions: No RLE Weight Bearing: Non weight bearing   Pt received semi-reclined in bed with nurse present for medication administration. Pt without reports of pain in session and requires supervision with supine>sit and with squat pivot to w/c.   Pt propelled w/c ~200 ft (S) to main gym. In parallel bars, pt performed following exercises to address balance, coordination, and strength deficits:   -Standing R hip abduction 3 x 10   -Standing R hip extension 3 x 10   -Standing R hip flexion 3 x 10   -Standing L heel raises 3 x 10   -Mini squats 1 x 8 & 1 x 6   In sitting pt performed 1 x 6 of shoulder flexion and chest press with tidal tank for UE strength and activity tolerance.   Pt left semi-reclined in bed with all needs in reach and alarm on.   Therapy/Group: Individual Therapy  Caleb [name] Caleb [name] PT, DPT  09/01/2022, 7:14 AM

## 2022-09-01 NOTE — Plan of Care (Signed)
  Problem: Consults Goal: RH LIMB LOSS PATIENT EDUCATION Description: Description: See Patient Education module for eduction specifics. Outcome: Progressing Goal: Skin Care Protocol Initiated - if Braden Score 18 or less Description: If consults are not indicated, leave blank or document N/A Outcome: Progressing Goal: Diabetes Guidelines if Diabetic/Glucose > 140 Description: If diabetic or lab glucose is > 140 mg/dl - Initiate Diabetes/Hyperglycemia Guidelines & Document Interventions  Outcome: Progressing   Problem: RH BOWEL ELIMINATION Goal: RH STG MANAGE BOWEL WITH ASSISTANCE Description: STG Manage Bowel with supv Assistance. Outcome: Progressing Goal: RH STG MANAGE BOWEL W/MEDICATION W/ASSISTANCE Description: STG Manage Bowel with Medication with min Assistance. Outcome: Progressing   Problem: RH SKIN INTEGRITY Goal: RH STG SKIN FREE OF INFECTION/BREAKDOWN Description: Incision will be free of infection/breakdown with min assist  Outcome: Progressing Goal: RH STG MAINTAIN SKIN INTEGRITY WITH ASSISTANCE Description: STG Maintain Skin Integrity With min Assistance. Outcome: Progressing Goal: RH STG ABLE TO PERFORM INCISION/WOUND CARE W/ASSISTANCE Description: STG Able To Perform Incision/Wound Care With min Assistance. Outcome: Progressing   Problem: RH SAFETY Goal: RH STG ADHERE TO SAFETY PRECAUTIONS W/ASSISTANCE/DEVICE Description: STG Adhere to Safety Precautions With cueing Assistance/Device. Outcome: Progressing Goal: RH STG DECREASED RISK OF FALL WITH ASSISTANCE Description: STG Decreased Risk of Fall With min Assistance. Outcome: Progressing   Problem: RH PAIN MANAGEMENT Goal: RH STG PAIN MANAGED AT OR BELOW PT'S PAIN GOAL Description: Pain will be managed at 4 out of 10 on pain scale with PRN medications min assist Outcome: Progressing   Problem: RH KNOWLEDGE DEFICIT LIMB LOSS Goal: RH STG INCREASE KNOWLEDGE OF SELF CARE AFTER LIMB LOSS Description: Patient  will be able to managed medications and wound care from nursing education and nursing handouts independently  Outcome: Progressing

## 2022-09-01 NOTE — Progress Notes (Signed)
Recreational Therapy Session Note  Patient Details  Name: Caleb [name] MRN: [id number] Date of Birth: 1950/09/19 Today's Date: 09/01/2022  Pain: c/o RLE pain, premedicated Skilled Therapeutic Interventions/Progress Updates: Pt refused scheduled group due to pain.  Nursing aware.  Leonela Kivi 09/01/2022, 5:50 PM

## 2022-09-01 NOTE — Progress Notes (Signed)
Given PRN pain medication for pain associated with BKA. Endorses pain as intermittent bouts of sharp stabbing pain. Also, burning sensation. Was hesitant to take pain medication due to concerns of constipation and upcoming discharge. Encouraged importance of staying on top of pain to ensure ability to participate in therapy.

## 2022-09-01 NOTE — Progress Notes (Signed)
PROGRESS NOTE   Subjective/Complaints: Reports he had some residual limb pain yesterday, pt reports this has resolved this AM.  + BM late last night- large.   ROS: + Phantom limb pain-improved Pt denies fever, chills, SOB, cough, abd pain, CP, N/V/D, HA + mild constipation-  resolved  Except for HPI Objective:   No results found. No results for input(s): "WBC", "HGB", "HCT", "PLT" in the last 72 hours.  Recent Labs    08/31/22 0604  NA 132*  K 5.0  CL 98  CO2 26  GLUCOSE 169*  BUN 64*  CREATININE 1.30*  CALCIUM 9.1     Intake/Output Summary (Last 24 hours) at 09/01/2022 0844 Last data filed at 09/01/2022 0719 Gross per 24 hour  Intake 1416 ml  Output 2250 ml  Net -834 ml         Physical Exam: Vital Signs Blood pressure 109/75, pulse 87, temperature 97.9 F (36.6 C), temperature source Oral, resp. rate 18, height 5' 6.93" (1.7 m), weight 73.9 kg, SpO2 99 %.  General: No acute distress.  In gym in [employer] Mood and affect are appropriate, pleasant Heart: RRR Lungs: Clear to auscultation, breathing unlabored, no rales or wheezes. No increased WOB Abdomen: Positive bowel sounds, soft nontender to palpation, nondistended Skin: RLE with limb protector in place  Musculoskeletal:        No joint swelling noted    Neurological:     General: No focal deficit present.     Mental Status: He is alert and oriented to person, place, and time.  Psychiatric:        Mood and Affect: Mood normal.        Behavior: Behavior normal.   Motor 4/5 Bilateral grip 5/5 in bialteral delt , Bi, Tri, Hip flexors left knee ext, 3- Left ankle DF PF Sensation absent to LT below the midcalf  Left foot and bilateral hand intrinsice atrophy  MSK- Left foot with 4th and 5th toe amps well healed   \    Assessment/Plan: 1. Functional deficits which require 3+ hours per day of interdisciplinary therapy in a comprehensive inpatient  rehab setting. Physiatrist is providing close team supervision and 24 hour management of active medical problems listed below. Physiatrist and rehab team continue to assess barriers to discharge/monitor patient progress toward functional and medical goals  Care Tool:  Bathing    Body parts bathed by patient: Right arm, Left arm, Chest, Abdomen, Front perineal area, Buttocks, Face, Left lower leg, Left upper leg, Right upper leg     Body parts n/a: Right lower leg   Bathing assist Assist Level: Supervision/Verbal cueing     Upper Body Dressing/Undressing Upper body dressing   What is the patient wearing?: Pull over shirt    Upper body assist Assist Level: Supervision/Verbal cueing    Lower Body Dressing/Undressing Lower body dressing      What is the patient wearing?: Pants, Underwear/pull up     Lower body assist Assist for lower body dressing: Contact Guard/Touching assist     Toileting Toileting    Toileting assist Assist for toileting: Contact Guard/Touching assist     Transfers Chair/bed transfer  Transfers assist  Chair/bed transfer assist level: Contact Guard/Touching assist     Locomotion Ambulation   Ambulation assist      Assist level: Minimal Assistance - Patient > 75% Assistive device: Walker-rolling Max distance: 53ft   Walk 10 feet activity   Assist     Assist level: Minimal Assistance - Patient > 75% Assistive device: Walker-rolling   Walk 50 feet activity   Assist Walk 50 feet with 2 turns activity did not occur: Safety/medical concerns (pain)         Walk 150 feet activity   Assist Walk 150 feet activity did not occur: Safety/medical concerns (pain)         Walk 10 feet on uneven surface  activity   Assist Walk 10 feet on uneven surfaces activity did not occur: Safety/medical concerns (pain)         Wheelchair     Assist Is the patient using a wheelchair?: Yes Type of Wheelchair: Manual     Wheelchair assist level: Supervision/Verbal cueing Max wheelchair distance: 146ft    Wheelchair 50 feet with 2 turns activity    Assist        Assist Level: Supervision/Verbal cueing   Wheelchair 150 feet activity     Assist      Assist Level: Supervision/Verbal cueing   Blood pressure 109/75, pulse 87, temperature 97.9 F (36.6 C), temperature source Oral, resp. rate 18, height 5' 6.93" (1.7 m), weight 73.9 kg, SpO2 99 %.  Medical Problem List and Plan: 1. Functional deficits secondary to Right BKA secondary to osteomyelitis RIght foot             -patient may  shower since wound vac is removed             -ELOS/Goals: 7-10d Sup/Mod I goals   Con't CIR PT and OT- ~ 2 weeks  -Estimated DC 6/17   2.  Antithrombotics: -DVT/anticoagulation:  Pharmaceutical: Eliquis             -antiplatelet therapy: aspirin 81 mg daily   3. Pain Management: Tylenol as needed             -continue gabapentin 300 mg TID   6/4- on Norco as needed q4 hours prn for pain- is working so far  6/5- piriformis syndrome- will start stretching and tennis ball usage; Also change Norco to Dilaudid since don't want to increase Norco due to tylenol- and cannot tolerate Oxy- 2 mg q4 hours prn  6/6- will d/w therapy about piriformis stretches- pain doing better on Dliaudid than Norco- better controlled  6/8: Discussed massage for phangom limb pains. Overall better.   6/9: See below; increase gabapentin to 300/300/600 mg  6/10 phantom pain improved, continue current regimen  6/12 no further phantom pain,continue current regimen  6/13 had episode of residual limb pain- improved with diluadid prn,, reports phantom pain controlled  4. Mood/Behavior/Sleep: LCSW to evaluate and provide emotional support             -antipsychotic agents: n/a   5. Neuropsych/cognition: This patient is capable of making decisions on his own behalf.   6. Skin/Wound Care: Routine skin care checks             -remove  Prevena in one week   6/6- due to be removed tomorrow- however since having new R distal/near incision pain- will ask Dr Lajoyce Corners if can remove today - removed  7. Fluids/Electrolytes/Nutrition: Routine Is and Os and follow-up chemistries             -  continue vitamin C, Nepro, Juven, zinc   8: Hypertension: monitor TID and prn; Lasix and hydralazine held due to soft BP             -continue carvedilol 3.125 mg BID             -continue Imdur 15 mg daily  6/13 well controlled     09/01/2022    5:26 AM 08/31/2022    8:12 PM 08/31/2022    1:15 PM  Vitals with BMI  Weight 162 lbs 15 oz  168 lbs 3 oz  BMI 25.57  26.4  Systolic 109 104 696  Diastolic 75 71 74  Pulse 87 87 83    9: Hyperlipidemia: continue statin   10: DM-2: A1c = 7.9%; CBGs QID; home regimen NPH 70/30 8 units before breakfast and 7 units before supper not restarted             -continue SSI   6/4- Will restart insulin once CBGs a little higher- right now mainly mid 100s- with 1 value 211- I'm concerned about starting insulin at thiese levels- don't want hypoglycemia  6/6- CBGs 153-184 in last 24 hours- will restart 70/30- because he wants home medicine used- 4 units BID-meals and monitor trend  6/12 CBGs well controlled, continue current CBG (last 3)  Recent Labs    08/31/22 1653 08/31/22 2109 09/01/22 0558  GLUCAP 117* 178* 137*    Increase 70/30 to 5U BID 6/7 ( will likely need to titrate up, has been receiving 6-7U novalog per day with SSI) 11: HFrEF: current EF ~25-30%; Lasix and hydralazine on hold             -daily weight , monitor for fluid overload             -continue Imdur 15 mg daily             -continue Carvedilol 3.125 mg BID   6/4- Weight down 1 kg- will monitor daily-   6/6- no weight in 2 days-   6/8-9: Downtrending   6/12 wt overall stable, appears to fluctuate, suspect non-precise measurements Filed Weights   08/31/22 0500 08/31/22 1315 09/01/22 0526  Weight: 71.7 kg 76.3 kg 73.9 kg      12: Aortic stenosis s/p TAVR 12/2021   13: History of CAD: Continue statin, aspirin, Eliquis             -follows with Dr. Allyson Sabal   14: Anemia of chronic disease/ABLA: follow-up CBC   6/4- Hb down to 7.9 from 8.5- wil recheck Thursday  15: CKD stage 3a: currently Scr at baseline>>1.28; follow-up BMP   6/4- Cr down to 1.14 from 1.28 and Cr 35 up from 31- will recheck Thursday   6/10 - Cr stable at 1.22, BUN up to 77, continue to hold lasix , recheck Wednesday  6/12 Cr around baseline, BUN a little improved to 64, continue to hold lasix   6/13 recheck BMP tomorrow   16: PAD: continue statin, aspirin, Eliquis   17: Hyponatremia; chronic: Lasix held; follow-up BMP   6/4- Na 131- on high side for him- will monitor  6/13 recheck tomorrow      Latest Ref Rng & Units 08/31/2022    6:04 AM 08/29/2022    6:44 AM 08/25/2022   10:18 AM  BMP  Glucose 70 - 99 mg/dL 295  284  132   BUN 8 - 23 mg/dL 64  77  40   Creatinine 0.61 -  1.24 mg/dL 9.14  7.82  9.56   Sodium 135 - 145 mmol/L 132  131  132   Potassium 3.5 - 5.1 mmol/L 5.0  4.9  4.3   Chloride 98 - 111 mmol/L 98  96  99   CO2 22 - 32 mmol/L 26  24  24    Calcium 8.9 - 10.3 mg/dL 9.1  9.1  8.7    6/7 stable mild hypoNa+, BUN still up cont hold furosemide  6/12Na stable at 132, continue to follow   18: Leukocytosis: no fever or known infection; follow-up CBC with diff   6/4- WBC down to normal levels- 10k- will monitor 19: Thrombocytosis: upward trend; follow-up CBC   6/4- Plts stable at 559k, likely reactive 20: GERD: continue Protonix   21: Elevated transaminases: improved; follow-up CMP   AST very slightly elevated at 42- but otherwise resolved  22: Diabetic neuropathy: continue gabapentin 300 mg TID   - 6/9: Last 2 Cr WNL; given ongoing phantom pains QHS increase gabapentin to 300/300/600 mg  23. Constipation  6/5- will give Sorbitol 30cc since no BM in 3 days except very small one last night  6/6- had good BM's.   6/11  reports large BM yesterday, will add PRN miralax  6/12 schedule miralax   6/13 improved with BM yesterday  24. R forearm- IV infiltration  6/6- will order warm compresses for R forearm   LOS: 10 days A FACE TO FACE EVALUATION WAS PERFORMED  Fanny Dance 09/01/2022, 8:44 AM

## 2022-09-01 NOTE — Progress Notes (Signed)
Physical Therapy Session Note  Patient Details  Name: Caleb [name] MRN: [id number] Date of Birth: 02-Apr-1950  Today's Date: 09/01/2022 PT Individual Time: 4098-1191 PT Individual Time Calculation (min): 32 min   Short Term Goals: Week 1:  PT Short Term Goal 1 (Week 1): STG=LTG due to LOS PT Short Term Goal 1 - Progress (Week 1): Progressing toward goal  Skilled Therapeutic Interventions/Progress Updates:    pt received in bed and agreeable to therapy. Pt reports no pain. CGA modified squat pivot transfer. Pt able to manage w/c parts with intermittent cueing and supervision. Supervision w/c propulsion to day room to set up wii game. While therapist was looking for remote, pt instructed to set up his shot before he stood. During this time, pt attempted to stand without assist. Pt remained in chair, but reported that residual limb did have contact with the floor and reports throbbing. Assessed skin visible under gauze, noted no tenderness or erythema. Nsg made aware. Pt requested to return to room. Supervision w/c mobility and CGA squat pivot. Provided education on waiting for assist, pt expressed understanding and states he will not attempt something like that again. Pt received medication from nsg before end of session. Pt remained in bed and was left with all needs in reach and alarm active. Missed 13 min d/t pain.   Therapy Documentation Precautions:  Precautions Precautions: Fall Precaution Comments: R BKA with wound vac Required Braces or Orthoses: Other Brace Other Brace: limb protector Restrictions Weight Bearing Restrictions: No RLE Weight Bearing: Non weight bearing General: PT Amount of Missed Time (min): 13 Minutes PT Missed Treatment Reason: Pain     Therapy/Group: Individual Therapy  Juluis Rainier 09/01/2022, 12:05 PM

## 2022-09-01 NOTE — Progress Notes (Signed)
Recreational Therapy Assessment and Plan  Patient Details  Name: Caleb [name] MRN: [id number] Date of Birth: Jul 04, 1950 Today's Date: 09/01/2022  Rehab Potential: Excellent ELOS: d/c 6/17   Assessment Hospital Problem: Principal Problem:   Unilateral complete BKA, right, initial encounter [facility])     Past Medical History:      Past Medical History:  Diagnosis Date   Carotid artery occlusion     CHF (congestive heart failure) (HCC)     Diabetes mellitus without complication (HCC)      Type II   Dyslipidemia 09/27/2012   Erectile dysfunction 09/27/2012   GERD (gastroesophageal reflux disease)     Hypercholesteremia     Hyperlipidemia 08/12/2012   Hypertension     Hyponatremia 08/14/2012   MGUS (monoclonal gammopathy of unknown significance)     Neuropathy     Pancreatitis     Pancreatitis, acute 09/27/2012   Peripheral vascular disease (HCC)     S/P TAVR (transcatheter aortic valve replacement) 01/11/2022    s/p TAVR with a 26 mm Edwards S3UR via the TF approach by Dr. Excell Seltzer & Bartle   Severe aortic stenosis     Vitamin D deficiency 06/23/2015    Past Surgical History:       Past Surgical History:  Procedure Laterality Date   ABDOMINAL AORTOGRAM W/LOWER EXTREMITY N/A 08/08/2019    Procedure: ABDOMINAL AORTOGRAM W/LOWER EXTREMITY;  Surgeon: Runell Gess, MD;  Location: [facility] INVASIVE CV LAB;  Service: Cardiovascular;  Laterality: N/A;   ABDOMINAL AORTOGRAM W/LOWER EXTREMITY N/A 11/18/2019    Procedure: ABDOMINAL AORTOGRAM W/LOWER EXTREMITY;  Surgeon: Runell Gess, MD;  Location: [facility] INVASIVE CV LAB;  Service: Cardiovascular;  Laterality: N/A;   ABDOMINAL AORTOGRAM W/LOWER EXTREMITY N/A 06/13/2022    Procedure: ABDOMINAL AORTOGRAM W/LOWER EXTREMITY;  Surgeon: Chuck Hint, MD;  Location: [facility] INVASIVE CV LAB;  Service: Cardiovascular;  Laterality: N/A;   AMPUTATION Right 08/19/2022    Procedure: RIGHT BELOW KNEE AMPUTATION;  Surgeon: Nadara Mustard, MD;   Location: [facility] OR;  Service: Orthopedics;  Laterality: Right;   APPLICATION OF WOUND VAC   06/24/2022    Procedure: APPLICATION OF WOUND VAC;  Surgeon: Nadara Mustard, MD;  Location: [facility] OR;  Service: Orthopedics;;   CARDIAC CATHETERIZATION       COLONOSCOPY   12 years ago    in [place] clinic= normal exam per pt   ENDARTERECTOMY FEMORAL Right 06/17/2022    Procedure: SUPERFICIAL ENDARTERECTOMY OF COMMON FEMORAL ARTERY;  Surgeon: Victorino Sparrow, MD;  Location: [facility] OR;  Service: Vascular;  Laterality: Right;   FEMORAL-TIBIAL BYPASS GRAFT Right 06/17/2022    Procedure: RIGHT FEMORAL- ANTERIOR TIBIAL ARTERY BYPASS;  Surgeon: Victorino Sparrow, MD;  Location: [facility] OR;  Service: Vascular;  Laterality: Right;   I & D EXTREMITY Right 06/24/2022    Procedure: RIGHT PARTIAL CALCANEUS EXCISION;  Surgeon: Nadara Mustard, MD;  Location: [facility] OR;  Service: Orthopedics;  Laterality: Right;   I & D EXTREMITY Right 07/29/2022    Procedure: RIGHT PARTIAL CALCANEUS EXCISION;  Surgeon: Nadara Mustard, MD;  Location: [facility] OR;  Service: Orthopedics;  Laterality: Right;   INTRAOPERATIVE TRANSTHORACIC ECHOCARDIOGRAM N/A 01/11/2022    Procedure: INTRAOPERATIVE TRANSTHORACIC ECHOCARDIOGRAM;  Surgeon: Tonny Bollman, MD;  Location: [facility] INVASIVE CV LAB;  Service: Open Heart Surgery;  Laterality: N/A;   KNEE SURGERY       MULTIPLE EXTRACTIONS WITH ALVEOLOPLASTY N/A 12/09/2021    Procedure: MULTIPLE EXTRACTION;  Surgeon: Sharman Cheek, DMD;  Location: [facility] OR;  Service: Dentistry;  Laterality: N/A;   PERIPHERAL VASCULAR INTERVENTION Right 08/08/2019    Procedure: PERIPHERAL VASCULAR INTERVENTION;  Surgeon: Runell Gess, MD;  Location: [facility] INVAS[facility] LAB;  Service: Cardiovascular;  Laterality: Right;   PERIPHERAL VASCULAR INTERVENTION Left 11/18/2019    Procedure: PERIPHERAL VASCULAR INTERVENTION;  Surgeon: Runell Gess, MD;  Location: [facility];  Service: Cardiovascular;  Laterality: Left;  left popliteal artery    RIGHT/LEFT HEART CATH AND CORONARY ANGIOGRAPHY N/A 10/29/2021    Procedure: RIGHT/LEFT HEART CATH AND CORONARY ANGIOGRAPHY;  Surgeon: Kathleene Hazel, MD;  Location: [facility] INVASIVE CV LAB;  Service: Cardiovascular;  Laterality: N/A;   TRANSCAROTID ARTERY REVASCULARIZATION  Left 12/16/2019    Procedure: TRANSCAROTID ARTERY REVASCULARIZATION;  Surgeon: Sherren Kerns, MD;  Location: [facility] OR;  Service: Vascular;  Laterality: Left;   TRANSCAROTID ARTERY REVASCULARIZATION  Right 02/03/2020    Procedure: RIGHT TRANSCAROTID ARTERY REVASCULARIZATION;  Surgeon: Sherren Kerns, MD;  Location: [facility] OR;  Service: Vascular;  Laterality: Right;   TRANSCATHETER AORTIC VALVE REPLACEMENT, TRANSFEMORAL N/A 01/11/2022    Procedure: Transcatheter Aortic Valve Replacement, Transfemoral;  Surgeon: Tonny Bollman, MD;  Location: [name] INVASIVE CV LAB;  Service: Open Heart Surgery;  Laterality: N/A;   ULTRASOUND GUIDANCE FOR VASCULAR ACCESS Right 12/16/2019    Procedure: ULTRASOUND GUIDANCE FOR VASCULAR ACCESS;  Surgeon: Sherren Kerns, MD;  Location: [facility] OR;  Service: Vascular;  Laterality: Right;   ULTRASOUND GUIDANCE FOR VASCULAR ACCESS Right 02/03/2020    Procedure: ULTRASOUND GUIDANCE FOR VASCULAR ACCESS;  Surgeon: Sherren Kerns, MD;  Location: [facility] OR;  Service: Vascular;  Laterality: Right;   VASECTOMY       VEIN HARVEST Right 06/17/2022    Procedure: IPSILATERAL RIGHT GREATER SAPHENOUS VEIN HARVEST;  Surgeon: Victorino Sparrow, MD;  Location: [facility] OR;  Service: Vascular;  Laterality: Right;      Assessment & Plan Clinical Impression: Caleb [name] is a 72 year old diabetic male with a history of chronic right foot osteomyelitis and is status post multiple surgical procedures.  He had most recently declined right below the knee amputation, but after reconsideration this was scheduled with Dr. Lajoyce Corners.  The patient presented to the emergency department on 08/18/2022, one day before surgery, complaining of  worsening pain.  His past medical history is significant for congestive heart failure, diabetes mellitus, hypercholesterolemia, hyperlipidemia, hypertension, MGUS, peripheral vascular disease, aortic stenosis status post TAVR in 2023. He is status post revascularization of the right lower extremity.  Despite revascularization, he did not have sufficient circulation to heal the gangrenous heel ulcer status post foot salvage intervention.  He was admitted by the hospitalist service and taken to the operating room on 5/31 and underwent right below the knee amputation by Dr. Lajoyce Corners with placement of Prevena wound VAC dressing.  His serum creatinine was elevated to 2.1 and his baseline is 1.5 with history of CKD stage 3a.  Laboratory workup reveals chronic hyponatremia and serum sodium was trended. He was transfused 2 units of PRBCs. Received IV iron. He is tolerating carb modified diet. The patient requires inpatient medicine and rehabilitation evaluations and services for ongoing dysfunction secondary to osteomyelitis, gangrene of right foot s/p right BKA.  PMH also significant for bilateral carotid artery stenosis s/ right left TCAR.Patient transferred to CIR on 08/22/2022 .     Pt presents with decreased activity tolerance, decreased functional mobility, decreased balance Limiting pt's independence with leisure/community pursuits.  Met with pt today to  discuss TR services including leisure education, activity analysis/modifications, coping/stress management and discharge planning.  Also discussed the importance of social, emotional, spiritual health in addition to physical health and their effects on overall health and wellness.  Pt stated understanding. Pt is motivated to return to previously enjoyed activities and upcoming discharge.    Leisure History/Participation Premorbid leisure interest/current participation: Sports - Exercise (Comment);Sports - Swimming;Community - Press photographer - Grocery  store;Community - Travel (Comment) Expression Interests: Music (Comment) Other Leisure Interests: Television Leisure Participation Style: Alone;With Family/Friends Awareness of Community Resources: Excellent Psychosocial / Spiritual Stress Management: Good Social interaction - Mood/Behavior: Cooperative Firefighter Appropriate for Education?: Yes Strengths/Weaknesses Patient Strengths/Abilities: Active premorbidly Patient weaknesses: Physical limitations TR Patient demonstrates impairments in the following area(s): Endurance;Motor;Pain;Skin Integrity  Plan Rec Therapy Plan Is patient appropriate for Therapeutic Recreation?: Yes Rehab Potential: Excellent Treatment times per week: Min 1 session >20 minutes during LOS Estimated Length of Stay: d/c 6/17 TR Treatment/Interventions: Patient/family education;Leisure education;Group participation (Comment)  Recommendations for other services: None   Discharge Criteria: Patient will be discharged from TR if patient refuses treatment 3 consecutive times without medical reason.  If treatment goals not met, if there is a change in medical status, if patient makes no progress towards goals or if patient is discharged from hospital.  The above assessment, treatment plan, treatment alternatives and goals were discussed and mutually agreed upon: by patient  Stewart Pimenta 09/01/2022, 11:12 AM

## 2022-09-02 DIAGNOSIS — I5022 Chronic systolic (congestive) heart failure: Secondary | ICD-10-CM | POA: Diagnosis not present

## 2022-09-02 DIAGNOSIS — E875 Hyperkalemia: Secondary | ICD-10-CM

## 2022-09-02 DIAGNOSIS — S88111A Complete traumatic amputation at level between knee and ankle, right lower leg, initial encounter: Secondary | ICD-10-CM | POA: Diagnosis not present

## 2022-09-02 DIAGNOSIS — M79604 Pain in right leg: Secondary | ICD-10-CM

## 2022-09-02 DIAGNOSIS — N1831 Chronic kidney disease, stage 3a: Secondary | ICD-10-CM | POA: Diagnosis not present

## 2022-09-02 DIAGNOSIS — E1122 Type 2 diabetes mellitus with diabetic chronic kidney disease: Secondary | ICD-10-CM | POA: Diagnosis not present

## 2022-09-02 LAB — BASIC METABOLIC PANEL
Anion gap: 8 (ref 5–15)
Anion gap: 9 (ref 5–15)
Anion gap: 9 (ref 5–15)
BUN: 63 mg/dL — ABNORMAL HIGH (ref 8–23)
BUN: 62 mg/dL — ABNORMAL HIGH (ref 8–23)
BUN: 65 mg/dL — ABNORMAL HIGH (ref 8–23)
CO2: 27 mmol/L (ref 22–32)
CO2: 27 mmol/L (ref 22–32)
CO2: 25 mmol/L (ref 22–32)
Calcium: 9.2 mg/dL (ref 8.9–10.3)
Calcium: 9.2 mg/dL (ref 8.9–10.3)
Calcium: 9.1 mg/dL (ref 8.9–10.3)
Chloride: 97 mmol/L — ABNORMAL LOW (ref 98–111)
Chloride: 95 mmol/L — ABNORMAL LOW (ref 98–111)
Chloride: 96 mmol/L — ABNORMAL LOW (ref 98–111)
Creatinine, Ser: 1.43 mg/dL — ABNORMAL HIGH (ref 0.61–1.24)
Creatinine, Ser: 1.37 mg/dL — ABNORMAL HIGH (ref 0.61–1.24)
Creatinine, Ser: 1.39 mg/dL — ABNORMAL HIGH (ref 0.61–1.24)
GFR, Estimated: 52 mL/min — ABNORMAL LOW (ref >60)
GFR, Estimated: 55 mL/min — ABNORMAL LOW (ref >60)
GFR, Estimated: 54 mL/min — ABNORMAL LOW (ref >60)
Glucose, Bld: 141 mg/dL — ABNORMAL HIGH (ref 70–99)
Glucose, Bld: 134 mg/dL — ABNORMAL HIGH (ref 70–99)
Glucose, Bld: 146 mg/dL — ABNORMAL HIGH (ref 70–99)
Potassium: 6.1 mmol/L — ABNORMAL HIGH (ref 3.5–5.1)
Potassium: 5.4 mmol/L — ABNORMAL HIGH (ref 3.5–5.1)
Potassium: 5 mmol/L (ref 3.5–5.1)
Sodium: 132 mmol/L — ABNORMAL LOW (ref 135–145)
Sodium: 131 mmol/L — ABNORMAL LOW (ref 135–145)
Sodium: 130 mmol/L — ABNORMAL LOW (ref 135–145)

## 2022-09-02 LAB — GLUCOSE, CAPILLARY
Glucose-Capillary: 137 mg/dL — ABNORMAL HIGH (ref 70–99)
Glucose-Capillary: 106 mg/dL — ABNORMAL HIGH (ref 70–99)
Glucose-Capillary: 135 mg/dL — ABNORMAL HIGH (ref 70–99)
Glucose-Capillary: 221 mg/dL — ABNORMAL HIGH (ref 70–99)

## 2022-09-02 MED ORDER — SODIUM ZIRCONIUM CYCLOSILICATE 10 G PO PACK
10.0000 g | PACK | Freq: Once | ORAL | Status: AC
  Administered 2022-09-02: 10 g via ORAL
  Filled 2022-09-02: qty 1

## 2022-09-02 MED ORDER — HYDROMORPHONE HCL 2 MG PO TABS
1.0000 mg | ORAL_TABLET | ORAL | Status: DC | PRN
  Administered 2022-09-02 – 2022-09-04 (×5): 1 mg via ORAL
  Filled 2022-09-02 (×6): qty 1

## 2022-09-02 MED ORDER — SODIUM ZIRCONIUM CYCLOSILICATE 5 G PO PACK: 5.0000 g | PACK | Freq: Once | ORAL | Status: DC

## 2022-09-02 NOTE — Plan of Care (Signed)
  Problem: Consults Goal: RH LIMB LOSS PATIENT EDUCATION Description: Description: See Patient Education module for eduction specifics. Outcome: Progressing Goal: Skin Care Protocol Initiated - if Braden Score 18 or less Description: If consults are not indicated, leave blank or document N/A Outcome: Progressing Goal: Diabetes Guidelines if Diabetic/Glucose > 140 Description: If diabetic or lab glucose is > 140 mg/dl - Initiate Diabetes/Hyperglycemia Guidelines & Document Interventions  Outcome: Progressing   Problem: RH BOWEL ELIMINATION Goal: RH STG MANAGE BOWEL WITH ASSISTANCE Description: STG Manage Bowel with supv Assistance. Outcome: Progressing Goal: RH STG MANAGE BOWEL W/MEDICATION W/ASSISTANCE Description: STG Manage Bowel with Medication with min Assistance. Outcome: Progressing   Problem: RH SKIN INTEGRITY Goal: RH STG SKIN FREE OF INFECTION/BREAKDOWN Description: Incision will be free of infection/breakdown with min assist  Outcome: Progressing Goal: RH STG MAINTAIN SKIN INTEGRITY WITH ASSISTANCE Description: STG Maintain Skin Integrity With min Assistance. Outcome: Progressing Goal: RH STG ABLE TO PERFORM INCISION/WOUND CARE W/ASSISTANCE Description: STG Able To Perform Incision/Wound Care With min Assistance. Outcome: Progressing   Problem: RH SAFETY Goal: RH STG ADHERE TO SAFETY PRECAUTIONS W/ASSISTANCE/DEVICE Description: STG Adhere to Safety Precautions With cueing Assistance/Device. Outcome: Progressing Goal: RH STG DECREASED RISK OF FALL WITH ASSISTANCE Description: STG Decreased Risk of Fall With min Assistance. Outcome: Progressing   Problem: RH PAIN MANAGEMENT Goal: RH STG PAIN MANAGED AT OR BELOW PT'S PAIN GOAL Description: Pain will be managed at 4 out of 10 on pain scale with PRN medications min assist Outcome: Progressing   Problem: RH KNOWLEDGE DEFICIT LIMB LOSS Goal: RH STG INCREASE KNOWLEDGE OF SELF CARE AFTER LIMB LOSS Description: Patient  will be able to managed medications and wound care from nursing education and nursing handouts independently  Outcome: Progressing   

## 2022-09-02 NOTE — Progress Notes (Signed)
Recreational Therapy Discharge Summary Patient Details  Name: Caleb [name] MRN: [id number] Date of Birth: 05-May-1950 Today's Date: 09/02/2022  Comments on progress toward goals: Pt is scheduled for discharge home 6/17.  Pt education provided on leisure education, activity analysis/modifications and coping.  Pt unable/declined to participate in scheduled group therapy session so pt unfortunately did not actively participate in therapeutic leisure task.  Pt did however attend a band performance on the unit for socialization and promote emotional wellness during LOS.   Reasons for discharge: discharge from hospital  Follow-up: Outpatient  Patient/family agrees with progress made and goals achieved: Yes  Lakai Moree 09/02/2022, 12:27 PM

## 2022-09-02 NOTE — Progress Notes (Signed)
Occupational Therapy Session Note  Patient Details  Name: Caleb [name] MRN: [id number] Date of Birth: Jun 13, 1950  Today's Date: 09/02/2022 OT Individual Time: 8657-8469 OT Individual Time Calculation (min): 58 min    Short Term Goals: Week 1:  OT Short Term Goal 1 (Week 1): LTG=STG d/t Pt ELOS  Skilled Therapeutic Interventions/Progress Updates:     Therapy Documentation Precautions:  Precautions Precautions: Fall Precaution Comments: R BKA with wound vac Required Braces or Orthoses: Other Brace Other Brace: limb protector Restrictions Weight Bearing Restrictions: No RLE Weight Bearing: Non weight bearing General: "Is this the last time I will see you?" Pt asleep supine in bed upon OT arrival, agreeable to OT session.   Pain: 1/10 pain reported in residual limb this date, handoff to nursing at end of session for medication  iADL: Pt in ADL kitchen practicing kitchen mobility at W/C level in order to increase safety. All kitchen activities SBA with occasional VC for processing. Pt simulated cooking in stove and oven. Pt gathered items of different weights from pantry, transported them in lap to kitchen counter. Pt retrieved pot from waist level cabinet while approaching at the correct angle with VC from OT. Pt practiced retrieving items from refrigerator. Pt simulated cooking on stove while seated in W/C. Pt simulated placing/retrieving items from oven. Pt educated on refraining from placing heavy items in oven d/t decreased sensation/strength in hands/fingers d/t diabetic neuropathy in hands.   Other Treatments:  OT and pt discussed home/kitchen set up in order for increased independence with kitchen tasks such as cooking/cleaning. Pt educated on simple modifications in order to easily access kitchen items, such as placing commonly used items on counter level to reach while W/C level. Pt discussed kitchen set up with OT and ability to access microwave, air fryer and stove. OT  educated correct angle to approach cabinets, refrigerator and over while seated in W/C. Pt practiced managing W/C leg rests and approaching transfer destinations at the correct angle and remembering to lock the W/C before completing transfers with no VC required. Pt educated on preparing meals on kitchen table before cooking for W/C accessible height.   Pt supine in bed with bed alarm activated, 2 bed rails up, call light within reach and 4Ps assessed. Handoff to nursing for medication.  Therapy/Group: Individual Therapy  Velia Meyer, OTD, OTR/L 09/02/2022, 3:14 PM

## 2022-09-02 NOTE — Progress Notes (Signed)
Patient requesting pain medication for right leg and pain in  left ankle. Endorses pain at a "10" and worst pain he has had. Expressed neuropathic pain as well as burning and aching with pain. Was asking for 2 mg of Dilaudid was explained the Provider changed PRN dose to 1 mg. Asking for writer to contact the on call Provider. On call Provider contacted. Returned back to patient explained again about talking to the Provider in person tomorrow and discussion with current treatment Provider. Patient wanting to be discharged with Dilaudid and ween himself off at home. Endorses having a Nurse to assist him with medication at home. Explains PRN pain medication aides him to function and have control over pain. Did explain to patient about changes with medication per Provider note and explained MOA of Gabapentin. Also, expressed in between PRN pain medication does have Tylenol order PRN can take that if no change with pain. No further issues at this time.

## 2022-09-02 NOTE — Progress Notes (Signed)
Occupational Therapy Session Note  Patient Details  Name: Caleb [name] MRN: [id number] Date of Birth: 1950/08/24  Today's Date: 09/02/2022 OT Individual Time: 1478-2956 OT Individual Time Calculation (min): 75 min    Short Term Goals: Week 1:  OT Short Term Goal 1 (Week 1): LTG=STG d/t Pt ELOS  Skilled Therapeutic Interventions/Progress Updates:    Pt practiced w/c setup and squat pivot tranfsers X 5. Sitting balance/core strengthening seated EOM for ball taps with 4# bar-4x20. Pt propelled w/c in ADL apartment and kitchen. Reviewed kitchen/home safety recommendations. Supervision for all transfers and w/c propulsion. Supervision for w/c setup. Pt returned to room and transferred back to bed. Pt remained in bed with all needs within reach. Bed alarm activated.   Therapy Documentation Precautions:  Precautions Precautions: Fall Precaution Comments: R BKA with wound vac Required Braces or Orthoses: Other Brace Other Brace: limb protector Restrictions Weight Bearing Restrictions: No RLE Weight Bearing: Non weight bearing Pain: Pt denies pain this morning. Pt reports phantom pain is very rare now.  Therapy/Group: Individual Therapy  Rich Brave 09/02/2022, 12:16 PM

## 2022-09-02 NOTE — Progress Notes (Signed)
Provider contacted about patient's elevate K+ and BUN. Verbal orders placed for redraw of labs. IV Consult placed patient refusing to have an IV in place.

## 2022-09-02 NOTE — Progress Notes (Signed)
PROGRESS NOTE   Subjective/Complaints: PT given lokelma this AM for hyperkalemia.  Reports pain is gradually improving.  Reports he had a fall yesterday when he tried to get up and use his absent distal RLE for ambulation, denies any injury with this.   ROS: + Phantom limb pain-improved Pt denies fever, chills, SOB, cough, abd pain, CP, N/V/D, HA + mild constipation-  resolved  Except for HPI Objective:   No results found. No results for input(s): "WBC", "HGB", "HCT", "PLT" in the last 72 hours.  Recent Labs    09/02/22 0412 09/02/22 0633  NA 132* 131*  K 6.1* 5.4*  CL 97* 95*  CO2 27 27  GLUCOSE 141* 134*  BUN 63* 62*  CREATININE 1.43* 1.37*  CALCIUM 9.2 9.2     Intake/Output Summary (Last 24 hours) at 09/02/2022 0817 Last data filed at 09/02/2022 0422 Gross per 24 hour  Intake 472 ml  Output 1675 ml  Net -1203 ml         Physical Exam: Vital Signs Blood pressure 116/79, pulse 89, temperature 97.9 F (36.6 C), temperature source Oral, resp. rate 18, height 5' 6.93" (1.7 m), weight 75 kg, SpO2 100 %.  General: No acute distress.  Sitting in bed Mood and affect are appropriate, pleasant Heart: RRR Lungs: Clear to auscultation, breathing unlabored, no rales or wheezes. No increased WOB Abdomen: Positive bowel sounds, soft nontender to palpation, nondistended Skin: RLE with limb protector in place, no drainage or signs of infection noted  Musculoskeletal:        No joint swelling noted    Neurological:     General: No focal deficit present.     Mental Status: He is alert and oriented to person, place, and time.  Psychiatric:        Mood and Affect: Mood normal.        Behavior: Behavior normal.   Motor 4/5 Bilateral grip 5/5 in bialteral delt , Bi, Tri, Hip flexors left knee ext, 3- Left ankle DF PF Sensation absent to LT below the midcalf  Left foot and bilateral hand intrinsice atrophy  MSK- Left  foot with 4th and 5th toe amps well healed   \    Assessment/Plan: 1. Functional deficits which require 3+ hours per day of interdisciplinary therapy in a comprehensive inpatient rehab setting. Physiatrist is providing close team supervision and 24 hour management of active medical problems listed below. Physiatrist and rehab team continue to assess barriers to discharge/monitor patient progress toward functional and medical goals  Care Tool:  Bathing    Body parts bathed by patient: Right arm, Left arm, Chest, Abdomen, Front perineal area, Buttocks, Face, Left lower leg, Left upper leg, Right upper leg     Body parts n/a: Right lower leg   Bathing assist Assist Level: Supervision/Verbal cueing     Upper Body Dressing/Undressing Upper body dressing   What is the patient wearing?: Pull over shirt    Upper body assist Assist Level: Supervision/Verbal cueing    Lower Body Dressing/Undressing Lower body dressing      What is the patient wearing?: Pants, Underwear/pull up     Lower body assist Assist for  lower body dressing: Contact Guard/Touching assist     Toileting Toileting    Toileting assist Assist for toileting: Contact Guard/Touching assist     Transfers Chair/bed transfer  Transfers assist     Chair/bed transfer assist level: Contact Guard/Touching assist     Locomotion Ambulation   Ambulation assist      Assist level: Minimal Assistance - Patient > 75% Assistive device: Walker-rolling Max distance: 37ft   Walk 10 feet activity   Assist     Assist level: Minimal Assistance - Patient > 75% Assistive device: Walker-rolling   Walk 50 feet activity   Assist Walk 50 feet with 2 turns activity did not occur: Safety/medical concerns (pain)         Walk 150 feet activity   Assist Walk 150 feet activity did not occur: Safety/medical concerns (pain)         Walk 10 feet on uneven surface  activity   Assist Walk 10 feet on  uneven surfaces activity did not occur: Safety/medical concerns (pain)         Wheelchair     Assist Is the patient using a wheelchair?: Yes Type of Wheelchair: Manual    Wheelchair assist level: Supervision/Verbal cueing Max wheelchair distance: 152ft    Wheelchair 50 feet with 2 turns activity    Assist        Assist Level: Supervision/Verbal cueing   Wheelchair 150 feet activity     Assist      Assist Level: Supervision/Verbal cueing   Blood pressure 116/79, pulse 89, temperature 97.9 F (36.6 C), temperature source Oral, resp. rate 18, height 5' 6.93" (1.7 m), weight 75 kg, SpO2 100 %.  Medical Problem List and Plan: 1. Functional deficits secondary to Right BKA secondary to osteomyelitis RIght foot             -patient may  shower since wound vac is removed             -ELOS/Goals: 7-10d Sup/Mod I goals   Con't CIR PT and OT- ~ 2 weeks  -Estimated DC 6/17  -Discussed fall prevention   2.  Antithrombotics: -DVT/anticoagulation:  Pharmaceutical: Eliquis             -antiplatelet therapy: aspirin 81 mg daily   3. Pain Management: Tylenol as needed             -continue gabapentin 300 mg TID   6/4- on Norco as needed q4 hours prn for pain- is working so far  6/5- piriformis syndrome- will start stretching and tennis ball usage; Also change Norco to Dilaudid since don't want to increase Norco due to tylenol- and cannot tolerate Oxy- 2 mg q4 hours prn  6/6- will d/w therapy about piriformis stretches- pain doing better on Dliaudid than Norco- better controlled  6/8: Discussed massage for phangom limb pains. Overall better.   6/9: See below; increase gabapentin to 300/300/600 mg  6/10 phantom pain improved, continue current regimen  6/12 no further phantom pain,continue current regimen  6/13 had episode of residual limb pain- improved with diluadid prn,, reports phantom pain controlled  6/14 will try decrease dilaudid to 1mg   PRN  4.  Mood/Behavior/Sleep: LCSW to evaluate and provide emotional support             -antipsychotic agents: n/a   5. Neuropsych/cognition: This patient is capable of making decisions on his own behalf.   6. Skin/Wound Care: Routine skin care checks             -  remove Prevena in one week   6/6- due to be removed tomorrow- however since having new R distal/near incision pain- will ask Dr Lajoyce Corners if can remove today - removed  7. Fluids/Electrolytes/Nutrition: Routine Is and Os and follow-up chemistries             -continue vitamin C, Nepro, Juven, zinc   8: Hypertension: monitor TID and prn; Lasix and hydralazine held due to soft BP             -continue carvedilol 3.125 mg BID             -continue Imdur 15 mg daily  6/14 controlled     09/02/2022    5:00 AM 09/02/2022    4:16 AM 09/01/2022    8:10 PM  Vitals with BMI  Weight 165 lbs 6 oz    BMI 25.95    Systolic  116 114  Diastolic  79 79  Pulse  89 83    9: Hyperlipidemia: continue statin   10: DM-2: A1c = 7.9%; CBGs QID; home regimen NPH 70/30 8 units before breakfast and 7 units before supper not restarted             -continue SSI   6/4- Will restart insulin once CBGs a little higher- right now mainly mid 100s- with 1 value 211- I'm concerned about starting insulin at thiese levels- don't want hypoglycemia  6/6- CBGs 153-184 in last 24 hours- will restart 70/30- because he wants home medicine used- 4 units BID-meals and monitor trend  6/13 CBGs controlled, continue current CBG (last 3)  Recent Labs    09/01/22 1711 09/01/22 2133 09/02/22 0628  GLUCAP 145* 168* 137*    Increase 70/30 to 5U BID 6/7 ( will likely need to titrate up, has been receiving 6-7U novalog per day with SSI) 11: HFrEF: current EF ~25-30%; Lasix and hydralazine on hold             -daily weight , monitor for fluid overload             -continue Imdur 15 mg daily             -continue Carvedilol 3.125 mg BID   6/4- Weight down 1 kg- will monitor daily-    6/6- no weight in 2 days-   6/8-9: Downtrending   6/14 wt appears stable, continue to hold lasix for now  [facility] Weights   08/31/22 1315 09/01/22 0526 09/02/22 0500  Weight: 76.3 kg 73.9 kg 75 kg     12: Aortic stenosis s/p TAVR 12/2021   13: History of CAD: Continue statin, aspirin, Eliquis             -follows with Dr. Allyson Sabal   14: Anemia of chronic disease/ABLA: follow-up CBC   6/4- Hb down to 7.9 from 8.5- wil recheck Thursday  15: CKD stage 3a: currently Scr at baseline>>1.28; follow-up BMP   6/4- Cr down to 1.14 from 1.28 and Cr 35 up from 31- will recheck Thursday   6/10 - Cr stable at 1.22, BUN up to 77, continue to hold lasix , recheck Wednesday  6/12 Cr around baseline, BUN a little improved to 64, continue to hold lasix   6/14 Cr appears lower then his baseline, BUN a little higher,  continue to hold lasix for now, no signs of fluid overload  16: PAD: continue statin, aspirin, Eliquis   17: Hyponatremia; chronic: Lasix held; follow-up BMP   6/4- Na 131- on  high side for him- will monitor  6/13 recheck tomorrow      Latest Ref Rng & Units 09/02/2022    6:33 AM 09/02/2022    4:12 AM 08/31/2022    6:04 AM  BMP  Glucose 70 - 99 mg/dL 161  096  045   BUN 8 - 23 mg/dL 62  63  64   Creatinine 0.61 - 1.24 mg/dL 4.09  8.11  9.14   Sodium 135 - 145 mmol/L 131  132  132   Potassium 3.5 - 5.1 mmol/L 5.4  6.1  5.0   Chloride 98 - 111 mmol/L 95  97  98   CO2 22 - 32 mmol/L 27  27  26    Calcium 8.9 - 10.3 mg/dL 9.2  9.2  9.1    6/7 stable mild hypoNa+, BUN still up cont hold furosemide  6/14 Na stable at 131  18: Leukocytosis: no fever or known infection; follow-up CBC with diff   6/4- WBC down to normal levels- 10k- will monitor 19: Thrombocytosis: upward trend; follow-up CBC   6/4- Plts stable at 559k, likely reactive 20: GERD: continue Protonix   21: Elevated transaminases: improved; follow-up CMP   AST very slightly elevated at 42- but otherwise resolved  22:  Diabetic neuropathy: continue gabapentin 300 mg TID   - 6/9: Last 2 Cr WNL; given ongoing phantom pains QHS increase gabapentin to 300/300/600 mg  23. Constipation  6/5- will give Sorbitol 30cc since no BM in 3 days except very small one last night  6/6- had good BM's.   6/11 reports large BM yesterday, will add PRN miralax  6/12 schedule miralax   6/13 improved with BM yesterday  24. R forearm- IV infiltration  6/6- will order warm compresses for R forearm  25. Hyperkalemia  -6/14, initially 6.11 but then 5.4 on recheck, given lokelma, recheck this afternoon   LOS: 11 days A FACE TO FACE EVALUATION WAS PERFORMED  Fanny Dance 09/02/2022, 8:17 AM

## 2022-09-02 NOTE — Progress Notes (Signed)
Physical Therapy Session Note  Patient Details  Name: Caleb [name] MRN: [id number] Date of Birth: 22-Aug-1950  Today's Date: 09/02/2022 PT Individual Time: 1103-1200 PT Individual Time Calculation (min): 57 min   Short Term Goals: Week 2:  PT Short Term Goal 1 (Week 2): STG=LTG's due to ELOS  Skilled Therapeutic Interventions/Progress Updates:    Pt presents in room in bed, agreeable to PT with encouragement. Pt denies pain. Session focused on education for discharge planning including stair negotiation and bathroom navigation. Pt completes squat pivot transfer with CGA, sit<>stand transfer CGA to RW throughout session. Pt manages WC parts with supervision for set up with all transfers this session.  Pt completes bed mobility with distant supervision and squat pivot to WC. Pt self propels from room to day room with supervision only for directional cues.  Pt discussed home set up for cousin's house with lengthy discussion and education completed about stairs. Pt cousin's house identified on google earth for visual assessment of front entrance layout. Pt reports there is a similar entrance set up in back of house with only two steps to a landing before getting into the door, both entrances without railings and with small landing prior to entry into door. Pt provided with education on two person assist for advancing WC up steps with education on hand placement by helpers on frame of WC to ensure safety. Pt provided with verbal and visual education tool of video online for two person assist with WC up steps.  Pt ambulates 2x38' with RW CGA, slow controlled gait speed with good foot clearance, improved step length and foot clearance with pt looking forward with verbal cues to correct.  Pt reports that he will not be able to access bathroom of cousin's house from [facility]. Pt completes multidirectional stepping activity with RW to promote improved postural stability for navigating narrow space in bathroom  including 2 steps forward, 2 steps to the R, 2 steps backward, and 2 steps to the left. Completed 4 trials total with seated rest break after first 2 trials.  Therapist reinforced that he will need someone present with any walking at discharge due to increased risk for falls with pt verbalizing understanding.  Pt completes standing balance alternating BUE support with x4 reps lifting BUEs off of RW to promote single limb stability needed for upright mobility with pt requiring CGA/min assist with task.  Pt self propels back to room with distant supervision and sets up for transfer back to bed with supervision only, able to manage WC parts and self corrects positioning of WC prior to transfer with CGA for squat pivot. Pt returns to supine distant supervision and remains supine with all needs within reach, call light in place, and bed alarm activated at end of session.  Therapy Documentation Precautions:  Precautions Precautions: Fall Precaution Comments: R BKA with wound vac Required Braces or Orthoses: Other Brace Other Brace: limb protector Restrictions Weight Bearing Restrictions: No RLE Weight Bearing: Non weight bearing   Therapy/Group: Individual Therapy  Edwin Cap PT, DPT 09/02/2022, 12:22 PM

## 2022-09-03 DIAGNOSIS — E871 Hypo-osmolality and hyponatremia: Secondary | ICD-10-CM | POA: Diagnosis not present

## 2022-09-03 DIAGNOSIS — N1831 Chronic kidney disease, stage 3a: Secondary | ICD-10-CM | POA: Diagnosis not present

## 2022-09-03 DIAGNOSIS — S88111A Complete traumatic amputation at level between knee and ankle, right lower leg, initial encounter: Secondary | ICD-10-CM | POA: Diagnosis not present

## 2022-09-03 DIAGNOSIS — E1122 Type 2 diabetes mellitus with diabetic chronic kidney disease: Secondary | ICD-10-CM | POA: Diagnosis not present

## 2022-09-03 LAB — GLUCOSE, CAPILLARY
Glucose-Capillary: 121 mg/dL — ABNORMAL HIGH (ref 70–99)
Glucose-Capillary: 102 mg/dL — ABNORMAL HIGH (ref 70–99)
Glucose-Capillary: 225 mg/dL — ABNORMAL HIGH (ref 70–99)
Glucose-Capillary: 194 mg/dL — ABNORMAL HIGH (ref 70–99)

## 2022-09-03 LAB — BASIC METABOLIC PANEL
Anion gap: 10 (ref 5–15)
BUN: 63 mg/dL — ABNORMAL HIGH (ref 8–23)
CO2: 26 mmol/L (ref 22–32)
Calcium: 9.1 mg/dL (ref 8.9–10.3)
Chloride: 95 mmol/L — ABNORMAL LOW (ref 98–111)
Creatinine, Ser: 1.41 mg/dL — ABNORMAL HIGH (ref 0.61–1.24)
GFR, Estimated: 53 mL/min — ABNORMAL LOW (ref >60)
Glucose, Bld: 139 mg/dL — ABNORMAL HIGH (ref 70–99)
Potassium: 4.8 mmol/L (ref 3.5–5.1)
Sodium: 131 mmol/L — ABNORMAL LOW (ref 135–145)

## 2022-09-03 NOTE — Progress Notes (Signed)
PROGRESS NOTE   Subjective/Complaints: Pt seen at bedside.  Nursing note last night he was concerned about decrease dilaudid to 1mg  dose. This am he reports this dose controlled his pain and he only used 1 dose today. He says he would like to gradually wean off this medication.   ROS: + Phantom limb pain-improved Residual limb pain- improving Pt denies fever, chills, SOB, cough, abd pain, CP, N/V/D, HA + mild constipation-  resolved  Except for HPI Objective:   No results found. No results for input(s): "WBC", "HGB", "HCT", "PLT" in the last 72 hours.  Recent Labs    09/02/22 1333 09/03/22 0527  NA 130* 131*  K 5.0 4.8  CL 96* 95*  CO2 25 26  GLUCOSE 146* 139*  BUN 65* 63*  CREATININE 1.39* 1.41*  CALCIUM 9.1 9.1     Intake/Output Summary (Last 24 hours) at 09/03/2022 2008 Last data filed at 09/03/2022 0800 Gross per 24 hour  Intake 358 ml  Output 720 ml  Net -362 ml         Physical Exam: Vital Signs Blood pressure 112/81, pulse 73, temperature 98.2 F (36.8 C), resp. rate 16, height 5' 6.93" (1.7 m), weight 72.8 kg, SpO2 100 %.  General: No acute distress.  Sitting in bed, appears comfortable Mood and affect are appropriate, pleasant Heart: RRR Lungs: Clear to auscultation, breathing unlabored, no rales or wheezes. No increased WOB Abdomen: Positive bowel sounds, soft nontender to palpation, nondistended Skin: RLE with limb protector in place, no drainage or signs of infection noted  Musculoskeletal:        No joint swelling noted    Neurological:     General: No focal deficit present.     Mental Status: He is alert and oriented to person, place, and time.  Psychiatric:        Mood and Affect: Mood normal.        Behavior: Behavior normal.   Motor 4/5 Bilateral grip 5/5 in bialteral delt , Bi, Tri, Hip flexors left knee ext, 3- Left ankle DF PF Sensation absent to LT below the midcalf  Left  foot and bilateral hand intrinsice atrophy  MSK- Left foot with 4th and 5th toe amps well healed   \    Assessment/Plan: 1. Functional deficits which require 3+ hours per day of interdisciplinary therapy in a comprehensive inpatient rehab setting. Physiatrist is providing close team supervision and 24 hour management of active medical problems listed below. Physiatrist and rehab team continue to assess barriers to discharge/monitor patient progress toward functional and medical goals  Care Tool:  Bathing    Body parts bathed by patient: Right arm, Left arm, Chest, Abdomen, Front perineal area, Buttocks, Face, Left lower leg, Left upper leg, Right upper leg     Body parts n/a: Right lower leg   Bathing assist Assist Level: Supervision/Verbal cueing     Upper Body Dressing/Undressing Upper body dressing   What is the patient wearing?: Pull over shirt    Upper body assist Assist Level: Supervision/Verbal cueing    Lower Body Dressing/Undressing Lower body dressing      What is the patient wearing?: Pants, Underwear/pull up  Lower body assist Assist for lower body dressing: Contact Guard/Touching assist     Toileting Toileting    Toileting assist Assist for toileting: Contact Guard/Touching assist     Transfers Chair/bed transfer  Transfers assist     Chair/bed transfer assist level: Contact Guard/Touching assist     Locomotion Ambulation   Ambulation assist      Assist level: Minimal Assistance - Patient > 75% Assistive device: Walker-rolling Max distance: 22ft   Walk 10 feet activity   Assist     Assist level: Minimal Assistance - Patient > 75% Assistive device: Walker-rolling   Walk 50 feet activity   Assist Walk 50 feet with 2 turns activity did not occur: Safety/medical concerns (pain)         Walk 150 feet activity   Assist Walk 150 feet activity did not occur: Safety/medical concerns (pain)         Walk 10 feet on  uneven surface  activity   Assist Walk 10 feet on uneven surfaces activity did not occur: Safety/medical concerns (pain)         Wheelchair     Assist Is the patient using a wheelchair?: Yes Type of Wheelchair: Manual    Wheelchair assist level: Supervision/Verbal cueing Max wheelchair distance: 158ft    Wheelchair 50 feet with 2 turns activity    Assist        Assist Level: Supervision/Verbal cueing   Wheelchair 150 feet activity     Assist      Assist Level: Supervision/Verbal cueing   Blood pressure 112/81, pulse 73, temperature 98.2 F (36.8 C), resp. rate 16, height 5' 6.93" (1.7 m), weight 72.8 kg, SpO2 100 %.  Medical Problem List and Plan: 1. Functional deficits secondary to Right BKA secondary to osteomyelitis RIght foot             -patient may  shower since wound vac is removed             -ELOS/Goals: 7-10d Sup/Mod I goals   Con't CIR PT and OT- ~ 2 weeks  -Estimated DC 6/17  -Discussed fall prevention   2.  Antithrombotics: -DVT/anticoagulation:  Pharmaceutical: Eliquis             -antiplatelet therapy: aspirin 81 mg daily   3. Pain Management: Tylenol as needed             -continue gabapentin 300 mg TID   6/4- on Norco as needed q4 hours prn for pain- is working so far  6/5- piriformis syndrome- will start stretching and tennis ball usage; Also change Norco to Dilaudid since don't want to increase Norco due to tylenol- and cannot tolerate Oxy- 2 mg q4 hours prn  6/6- will d/w therapy about piriformis stretches- pain doing better on Dliaudid than Norco- better controlled  6/8: Discussed massage for phangom limb pains. Overall better.   6/9: See below; increase gabapentin to 300/300/600 mg  6/10 phantom pain improved, continue current regimen  6/12 no further phantom pain,continue current regimen  6/13 had episode of residual limb pain- improved with diluadid prn,, reports phantom pain controlled  6/14 will try decrease dilaudid to  1mg   PRN  6/15 continue 1mg   dilaudid PRN as he reports this is keeping pain controlled, he reports phantom pain is controlled. Reports nausea with tramadol, oxycodone in past  4. Mood/Behavior/Sleep: LCSW to evaluate and provide emotional support             -antipsychotic agents: n/a  5. Neuropsych/cognition: This patient is capable of making decisions on his own behalf.   6. Skin/Wound Care: Routine skin care checks             -remove Prevena in one week   6/6- due to be removed tomorrow- however since having new R distal/near incision pain- will ask Dr Lajoyce Corners if can remove today - removed  7. Fluids/Electrolytes/Nutrition: Routine Is and Os and follow-up chemistries             -continue vitamin C, Nepro, Juven, zinc   8: Hypertension: monitor TID and prn; Lasix and hydralazine held due to soft BP             -continue carvedilol 3.125 mg BID             -continue Imdur 15 mg daily  6/15 well controlled overall     09/03/2022    5:00 AM 09/03/2022    4:42 AM 09/02/2022   10:56 PM  Vitals with BMI  Weight 160 lbs 8 oz    BMI 25.19    Systolic  112 122  Diastolic  81 74  Pulse  73 79    9: Hyperlipidemia: continue statin   10: DM-2: A1c = 7.9%; CBGs QID; home regimen NPH 70/30 8 units before breakfast and 7 units before supper not restarted             -continue SSI   6/4- Will restart insulin once CBGs a little higher- right now mainly mid 100s- with 1 value 211- I'm concerned about starting insulin at thiese levels- don't want hypoglycemia  6/6- CBGs 153-184 in last 24 hours- will restart 70/30- because he wants home medicine used- 4 units BID-meals and monitor trend   CBG (last 3)  Recent Labs    09/03/22 0555 09/03/22 1142 09/03/22 1642  GLUCAP 121* 102* 225*    Increase 70/30 to 5U BID 6/7 ( will likely need to titrate up, has been receiving 6-7U novalog per day with SSI)  6/15 controlled overall, one elevated value this afternoon 11: HFrEF: current EF ~25-30%;  Lasix and hydralazine on hold             -daily weight , monitor for fluid overload             -continue Imdur 15 mg daily             -continue Carvedilol 3.125 mg BID   6/4- Weight down 1 kg- will monitor daily-   6/6- no weight in 2 days-   6/8-9: Downtrending   6/14 wt appears stable, continue to hold lasix for now  Filed Weights   09/01/22 0526 09/02/22 0500 09/03/22 0500  Weight: 73.9 kg 75 kg 72.8 kg     12: Aortic stenosis s/p TAVR 12/2021   13: History of CAD: Continue statin, aspirin, Eliquis             -follows with Dr. Allyson Sabal   14: Anemia of chronic disease/ABLA: follow-up CBC   6/4- Hb down to 7.9 from 8.5- wil recheck Thursday  15: CKD stage 3a: currently Scr at baseline>>1.28; follow-up BMP   6/4- Cr down to 1.14 from 1.28 and Cr 35 up from 31- will recheck Thursday   6/10 - Cr stable at 1.22, BUN up to 77, continue to hold lasix , recheck Wednesday  6/12 Cr stable 1.41, BUN 63 today, hold lasix   16: PAD: continue statin, aspirin, Eliquis   17: Hyponatremia;  chronic: Lasix held; follow-up BMP   6/4- Na 131- on high side for him- will monitor  6/15 Na sable 131 today     Latest Ref Rng & Units 09/03/2022    5:27 AM 09/02/2022    1:33 PM 09/02/2022    6:33 AM  BMP  Glucose 70 - 99 mg/dL 409  811  914   BUN 8 - 23 mg/dL 63  65  62   Creatinine 0.61 - 1.24 mg/dL 7.82  9.56  2.13   Sodium 135 - 145 mmol/L 131  130  131   Potassium 3.5 - 5.1 mmol/L 4.8  5.0  5.4   Chloride 98 - 111 mmol/L 95  96  95   CO2 22 - 32 mmol/L 26  25  27    Calcium 8.9 - 10.3 mg/dL 9.1  9.1  9.2    6/7 stable mild hypoNa+, BUN still up cont hold furosemide  6/14 Na stable at 131  18: Leukocytosis: no fever or known infection; follow-up CBC with diff   6/4- WBC down to normal levels- 10k- will monitor 19: Thrombocytosis: upward trend; follow-up CBC   6/4- Plts stable at 559k, likely reactive 20: GERD: continue Protonix   21: Elevated transaminases: improved; follow-up CMP    AST very slightly elevated at 42- but otherwise resolved  22: Diabetic neuropathy: continue gabapentin 300 mg TID   - 6/9: Last 2 Cr WNL; given ongoing phantom pains QHS increase gabapentin to 300/300/600 mg  23. Constipation  6/5- will give Sorbitol 30cc since no BM in 3 days except very small one last night  6/6- had good BM's.   6/11 reports large BM yesterday, will add PRN miralax  6/12 schedule miralax   LBM 6/13  24. R forearm- IV infiltration  6/6- will order warm compresses for R forearm  25. Hyperkalemia  -6/14, initially 6.11 but then 5.4 on recheck, given lokelma  -6/15 K+ down to 4.8 today, recheck monday   LOS: 12 days A FACE TO FACE EVALUATION WAS PERFORMED  Fanny Dance 09/03/2022, 8:08 PM

## 2022-09-03 NOTE — Progress Notes (Signed)
Physical Therapy Session Note  Patient Details  Name: Caleb [name] MRN: [id number] Date of Birth: 12-01-1950  Today's Date: 09/03/2022 PT Individual Time: 0800-0914 PT Individual Time Calculation (min): 74 min   Short Term Goals: Week 2:  PT Short Term Goal 1 (Week 2): STG=LTG's due to ELOS  Skilled Therapeutic Interventions/Progress Updates:     Pt received semi reclined in bed and agrees to therapy. No complaint of pain. Pt performs bed mobility with bed features and no assistance required. Pt able to don Rt limp protector with setup assistance and PT provides assistance to don Lt shoe for time management. Pt completes squat pivot transfer from bed to [facility] with CGA and cues for positioning and initiation. Session emphasizes WC mobility and therapeutic activity, with pt propelling WC throughout unit, navigating elevator with cues for safety and recommendation to back into elevator for ease of exit. Pt then propels WC outside for endurance training and practice mobility over unlevel and varying surfaces. PT provides cues for effective propulsion technique when on unlevel ground. Pt requires multiple seated rest breaks during session. Pt propels WC >1000'. PT does not provide physical assistance. Pt performs squat pivot back to bed with reminder to lock brakes of WC prior to attempting transfer. Pt left semi reclined with all needs within reach.    Therapy Documentation Precautions:  Precautions Precautions: Fall Precaution Comments: R BKA with wound vac Required Braces or Orthoses: Other Brace Other Brace: limb protector Restrictions Weight Bearing Restrictions: No RLE Weight Bearing: Non weight bearing    Therapy/Group: Individual Therapy  Beau Fanny, PT, DPT 09/03/2022, 10:13 AM

## 2022-09-04 DIAGNOSIS — E871 Hypo-osmolality and hyponatremia: Secondary | ICD-10-CM | POA: Diagnosis not present

## 2022-09-04 DIAGNOSIS — E1122 Type 2 diabetes mellitus with diabetic chronic kidney disease: Secondary | ICD-10-CM | POA: Diagnosis not present

## 2022-09-04 DIAGNOSIS — S88111A Complete traumatic amputation at level between knee and ankle, right lower leg, initial encounter: Secondary | ICD-10-CM | POA: Diagnosis not present

## 2022-09-04 DIAGNOSIS — N1831 Chronic kidney disease, stage 3a: Secondary | ICD-10-CM | POA: Diagnosis not present

## 2022-09-04 LAB — GLUCOSE, CAPILLARY
Glucose-Capillary: 117 mg/dL — ABNORMAL HIGH (ref 70–99)
Glucose-Capillary: 137 mg/dL — ABNORMAL HIGH (ref 70–99)
Glucose-Capillary: 121 mg/dL — ABNORMAL HIGH (ref 70–99)
Glucose-Capillary: 132 mg/dL — ABNORMAL HIGH (ref 70–99)

## 2022-09-04 MED ORDER — HYDROCODONE-ACETAMINOPHEN 5-325 MG PO TABS
1.0000 | ORAL_TABLET | Freq: Four times a day (QID) | ORAL | Status: DC | PRN
  Administered 2022-09-04 – 2022-09-05 (×2): 1 via ORAL
  Filled 2022-09-04 (×2): qty 1

## 2022-09-04 MED ORDER — NEPRO/CARBSTEADY PO LIQD
237.0000 mL | Freq: Two times a day (BID) | ORAL | Status: DC
  Administered 2022-09-04: 237 mL via ORAL

## 2022-09-04 NOTE — Progress Notes (Signed)
Occupational Therapy Discharge Summary  Patient Details  Name: Caleb [name] MRN: [id number] Date of Birth: 06-18-1950  Date of Discharge from OT service:September 04, 2022  Today's Date: 09/04/2022 OT Individual Time: 1005-1100 OT Individual Time Calculation (min): 55 min    Patient has met 9 of 9 long term goals due to improved activity tolerance, improved balance, postural control, and ability to compensate for deficits.  Patient to discharge at overall Modified Independent level.  Patient's care partner is independent to provide the necessary physical assistance at discharge.    Recommendation:  Patient will benefit from ongoing skilled OT services in home health setting to continue to advance functional skills in the area of BADL, iADL, and Reduce care partner burden.  Equipment: TTB and DABSC  Reasons for discharge: treatment goals met and discharge from hospital  Patient/family agrees with progress made and goals achieved: Yes  OT Discharge Precautions/Restrictions  Precautions Precautions: Fall Required Braces or Orthoses: Other Brace Other Brace: Limb protector Restrictions Weight Bearing Restrictions: Yes RLE Weight Bearing: Non weight bearing Pain Pain Assessment Pain Scale: 0-10 Pain Score: 0-No pain ADL ADL Eating: Modified independent Where Assessed-Eating: Edge of bed Grooming: Modified independent Where Assessed-Grooming: Wheelchair Upper Body Bathing: Modified independent Where Assessed-Upper Body Bathing: Shower Lower Body Bathing: Modified independent Where Assessed-Lower Body Bathing: Shower Upper Body Dressing: Modified independent (Device) Where Assessed-Upper Body Dressing: Edge of bed Lower Body Dressing: Modified independent Where Assessed-Lower Body Dressing: Edge of bed Toileting: Modified independent Where Assessed-Toileting: Teacher, adult education: Engineer, agricultural Method: Proofreader: Editor, commissioning: Modified independent Web designer Method: Ship broker: Insurance underwriter: Modified independent Film/video editor Method: Designer, industrial/product: Other (comment) (DABSC) Vision Baseline Vision/History: 1 Wears glasses Patient Visual Report: No change from baseline Vision Assessment?: No apparent visual deficits Perception  Perception: Within Functional Limits Praxis Praxis: Intact Cognition Cognition Overall Cognitive Status: Within Functional Limits for tasks assessed Arousal/Alertness: Awake/alert Memory: Appears intact Awareness: Appears intact Problem Solving: Appears intact Safety/Judgment: Appears intact Brief Interview for Mental Status (BIMS) Repetition of Three Words (First Attempt): 3 Temporal Orientation: Year: Correct Temporal Orientation: Month: Accurate within 5 days Temporal Orientation: Day: Correct Recall: "Sock": Yes, no cue required Recall: "Blue": Yes, no cue required Recall: "Bed": Yes, no cue required BIMS Summary Score: 15 Sensation Sensation Light Touch: Impaired Detail Peripheral sensation comments: Neuropathy present in bilateral hands and foot. Light Touch Impaired Details: Impaired RUE;Impaired LUE;Impaired LLE Hot/Cold: Appears Intact Proprioception: Appears Intact Coordination Gross Motor Movements are Fluid and Coordinated: No Fine Motor Movements are Fluid and Coordinated: No Coordination and Movement Description: Altered balance strategies due to R BKA and neuropathy. FM skills deficits due to nueropathy. Motor  Motor Motor: Other (comment) Motor - Skilled Clinical Observations: Altered balance strategies due to R BKA, neuropathy, and pain. Mobility  Transfers Sit to Stand: Independent with assistive device Stand to Sit: Independent with assistive device  Trunk/Postural Assessment  Cervical Assessment Cervical Assessment: Within  Functional Limits Thoracic Assessment Thoracic Assessment: Within Functional Limits Lumbar Assessment Lumbar Assessment: Exceptions to [facility] [place] (posterior pelvic tilt) Postural Control Postural Control: Deficits on evaluation  Balance Balance Balance Assessed: Yes Static Sitting Balance Static Sitting - Balance Support: Feet supported Static Sitting - Level of Assistance: 6: Modified independent (Device/Increase time) Dynamic Sitting Balance Dynamic Sitting - Balance Support: During functional activity Dynamic Sitting - Level of Assistance: 6: Modified independent (Device/Increase time) Static Standing Balance Static Standing -  Balance Support: Bilateral upper extremity supported;During functional activity Static Standing - Level of Assistance: 6: Modified independent (Device/Increase time) Dynamic Standing Balance Dynamic Standing - Level of Assistance: 5: Stand by assistance (CGA) Extremity/Trunk Assessment RUE Assessment RUE Assessment: Within Functional Limits LUE Assessment LUE Assessment: Within Functional Limits  Pt received resting in bed for skilled OT session with focus on BADL participation. Pt agreeable to interventions, demonstrating overall pleasant mood. Pt reported 0/10 pain. OT offering intermediate rest breaks and positioning suggestions throughout session to address potential pain/fatigue and maximize participation/safety in session.  Pt performs full-shower with levels of assisted noted above, residual limb waterproofed. Pt step-hops into/out of bathroom with supervision + RW, as he will need to do this at home to access his bathroom. All other questions answered.   Pt remained resting in bed with all immediate needs met at end of session. Pt continues to be appropriate for skilled OT intervention to promote further functional independence.   Lou Cal, OTR/L, MSOT  09/04/2022, 11:05 AM

## 2022-09-04 NOTE — Plan of Care (Signed)
  Problem: RH Balance Goal: LTG Patient will maintain dynamic standing with ADLs (OT) Description: LTG:  Patient will maintain dynamic standing balance with assist during activities of daily living (OT)  Outcome: Completed/Met   Problem: Sit to Stand Goal: LTG:  Patient will perform sit to stand in prep for activites of daily living with assistance level (OT) Description: LTG:  Patient will perform sit to stand in prep for activites of daily living with assistance level (OT) Outcome: Completed/Met   Problem: RH Bathing Goal: LTG Patient will bathe all body parts with assist levels (OT) Description: LTG: Patient will bathe all body parts with assist levels (OT) Outcome: Completed/Met   Problem: RH Dressing Goal: LTG Patient will perform lower body dressing w/assist (OT) Description: LTG: Patient will perform lower body dressing with assist, with/without cues in positioning using equipment (OT) Outcome: Completed/Met   Problem: RH Toileting Goal: LTG Patient will perform toileting task (3/3 steps) with assistance level (OT) Description: LTG: Patient will perform toileting task (3/3 steps) with assistance level (OT)  Outcome: Completed/Met   Problem: RH Simple Meal Prep Goal: LTG Patient will perform simple meal prep w/assist (OT) Description: LTG: Patient will perform simple meal prep with assistance, with/without cues (OT). Outcome: Completed/Met   Problem: RH Light Housekeeping Goal: LTG Patient will perform light housekeeping w/assist (OT) Description: LTG: Patient will perform light housekeeping with assistance, with/without cues (OT). Outcome: Completed/Met   Problem: RH Toilet Transfers Goal: LTG Patient will perform toilet transfers w/assist (OT) Description: LTG: Patient will perform toilet transfers with assist, with/without cues using equipment (OT) Outcome: Completed/Met   Problem: RH Tub/Shower Transfers Goal: LTG Patient will perform tub/shower transfers w/assist  (OT) Description: LTG: Patient will perform tub/shower transfers with assist, with/without cues using equipment (OT) Outcome: Completed/Met   

## 2022-09-04 NOTE — Progress Notes (Addendum)
PROGRESS NOTE   Subjective/Complaints: No new concerns this morning.  He reports residual limb and phantom pain are both improving.  He has had occasional use of Dilaudid 1 mg 1 was pain was severe. He would like a different nutritional supplements since Glucerna was discontinued due to hyperkalemia.  ROS: + Phantom limb pain-improved Residual limb pain- improving Pt denies fever, chills, SOB, cough, abd pain, CP, N/V/D, HA, vision changes + mild constipation-  resolved  Except for HPI Objective:   No results found. No results for input(s): "WBC", "HGB", "HCT", "PLT" in the last 72 hours.  Recent Labs    09/02/22 1333 09/03/22 0527  NA 130* 131*  K 5.0 4.8  CL 96* 95*  CO2 25 26  GLUCOSE 146* 139*  BUN 65* 63*  CREATININE 1.39* 1.41*  CALCIUM 9.1 9.1     Intake/Output Summary (Last 24 hours) at 09/04/2022 1949 Last data filed at 09/04/2022 0700 Gross per 24 hour  Intake 118 ml  Output 1200 ml  Net -1082 ml         Physical Exam: Vital Signs Blood pressure 110/78, pulse 80, temperature 97.7 F (36.5 C), temperature source Oral, resp. rate 17, height 5' 6.93" (1.7 m), weight 73.5 kg, SpO2 99 %.  General: No acute distress.  Sitting in bed, appears comfortable Mood and affect are appropriate, pleasant Heart: RRR Lungs: CTAB, no RRW Abdomen: Soft nontender nondistended, positive bowel sounds Skin: RLE with limb protector in place  Musculoskeletal:        No joint swelling noted    Neurological:     General: No focal deficit present.  Cranial nerves II through XII grossly intact    Mental Status: He is alert and oriented to person, place, and time.   Motor 4/5 Bilateral grip 5/5 in bialteral delt , Bi, Tri, Hip flexors left knee ext, 3- Left ankle DF PF Sensation absent to LT below the midcalf  Left foot and bilateral hand intrinsice atrophy  MSK- Left foot with 4th and 5th toe amps well healed   Psychiatric:        Mood and Affect: Mood normal.        Behavior: Behavior normal.   \    Assessment/Plan: 1. Functional deficits which require 3+ hours per day of interdisciplinary therapy in a comprehensive inpatient rehab setting. Physiatrist is providing close team supervision and 24 hour management of active medical problems listed below. Physiatrist and rehab team continue to assess barriers to discharge/monitor patient progress toward functional and medical goals  Care Tool:  Bathing    Body parts bathed by patient: Right arm, Left arm, Chest, Abdomen, Front perineal area, Buttocks, Face, Left lower leg, Left upper leg, Right upper leg     Body parts n/a: Right lower leg   Bathing assist Assist Level: Independent with assistive device     Upper Body Dressing/Undressing Upper body dressing   What is the patient wearing?: Pull over shirt    Upper body assist Assist Level: Independent with assistive device    Lower Body Dressing/Undressing Lower body dressing      What is the patient wearing?: Pants, Underwear/pull up  Lower body assist Assist for lower body dressing: Independent with assitive device     Toileting Toileting    Toileting assist Assist for toileting: Independent with assistive device     Transfers Chair/bed transfer  Transfers assist     Chair/bed transfer assist level: Independent with assistive device     Locomotion Ambulation   Ambulation assist      Assist level: Supervision/Verbal cueing Assistive device: Walker-rolling Max distance: 67 ft   Walk 10 feet activity   Assist     Assist level: Supervision/Verbal cueing Assistive device: Walker-rolling   Walk 50 feet activity   Assist Walk 50 feet with 2 turns activity did not occur: Safety/medical concerns (pain)  Assist level: Supervision/Verbal cueing Assistive device: Walker-rolling    Walk 150 feet activity   Assist Walk 150 feet activity did not  occur: Safety/medical concerns (pain, fatigue)         Walk 10 feet on uneven surface  activity   Assist Walk 10 feet on uneven surfaces activity did not occur: Safety/medical concerns (pain)   Assist level: Contact Guard/Touching assist     Wheelchair     Assist Is the patient using a wheelchair?: Yes Type of Wheelchair: Manual    Wheelchair assist level: Independent Max wheelchair distance: >1000 ft    Wheelchair 50 feet with 2 turns activity    Assist        Assist Level: Independent   Wheelchair 150 feet activity     Assist      Assist Level: Independent   Blood pressure 110/78, pulse 80, temperature 97.7 F (36.5 C), temperature source Oral, resp. rate 17, height 5' 6.93" (1.7 m), weight 73.5 kg, SpO2 99 %.  Medical Problem List and Plan: 1. Functional deficits secondary to Right BKA secondary to osteomyelitis RIght foot             -patient may  shower since wound vac is removed             -ELOS/Goals: 7-10d Sup/Mod I goals   Con't CIR PT and OT- ~ 2 weeks  -Estimated DC 6/17  -Discussed fall prevention  -Expected discharge tomorrow   2.  Antithrombotics: -DVT/anticoagulation:  Pharmaceutical: Eliquis             -antiplatelet therapy: aspirin 81 mg daily   3. Pain Management: Tylenol as needed             -continue gabapentin 300 mg TID   6/4- on Norco as needed q4 hours prn for pain- is working so far  6/5- piriformis syndrome- will start stretching and tennis ball usage; Also change Norco to Dilaudid since don't want to increase Norco due to tylenol- and cannot tolerate Oxy- 2 mg q4 hours prn  6/6- will d/w therapy about piriformis stretches- pain doing better on Dliaudid than Norco- better controlled  6/8: Discussed massage for phangom limb pains. Overall better.   6/9: See below; increase gabapentin to 300/300/600 mg  6/10 phantom pain improved, continue current regimen  6/12 no further phantom pain,continue current  regimen  6/13 had episode of residual limb pain- improved with diluadid prn,, reports phantom pain controlled  6/14 will try decrease dilaudid to 1mg   PRN  6/15 continue 1mg   dilaudid PRN as he reports this is keeping pain controlled, he reports phantom pain is controlled. Reports nausea with tramadol, oxycodone in past  6/16 will restart Norco 5 mg and DC Dilaudid, appears he was tolerating this medicine previously  4. Mood/Behavior/Sleep: LCSW to evaluate and provide emotional support             -antipsychotic agents: n/a   5. Neuropsych/cognition: This patient is capable of making decisions on his own behalf.   6. Skin/Wound Care: Routine skin care checks             -remove Prevena in one week   6/6- due to be removed tomorrow- however since having new R distal/near incision pain- will ask Dr Lajoyce Corners if can remove today - removed  7. Fluids/Electrolytes/Nutrition: Routine Is and Os and follow-up chemistries             -continue vitamin C, Nepro, Juven, zinc   8: Hypertension: monitor TID and prn; Lasix and hydralazine held due to soft BP             -continue carvedilol 3.125 mg BID             -continue Imdur 15 mg daily  6/15 well controlled overall     09/04/2022    1:09 PM 09/04/2022    5:00 AM 09/04/2022    4:59 AM  Vitals with BMI  Weight  162 lbs 1 oz   BMI  25.43   Systolic 110  121  Diastolic 78  81  Pulse 80  87    9: Hyperlipidemia: continue statin   10: DM-2: A1c = 7.9%; CBGs QID; home regimen NPH 70/30 8 units before breakfast and 7 units before supper not restarted             -continue SSI   6/4- Will restart insulin once CBGs a little higher- right now mainly mid 100s- with 1 value 211- I'm concerned about starting insulin at thiese levels- don't want hypoglycemia  6/6- CBGs 153-184 in last 24 hours- will restart 70/30- because he wants home medicine used- 4 units BID-meals and monitor trend   CBG (last 3)  Recent Labs    09/04/22 0650 09/04/22 1129  09/04/22 1705  GLUCAP 117* 137* 121*    Increase 70/30 to 5U BID 6/7 ( will likely need to titrate up, has been receiving 6-7U novalog per day with SSI)  6/16 well-controlled, continue current regimen 11: HFrEF: current EF ~25-30%; Lasix and hydralazine on hold             -daily weight , monitor for fluid overload             -continue Imdur 15 mg daily             -continue Carvedilol 3.125 mg BID   6/4- Weight down 1 kg- will monitor daily-   6/6- no weight in 2 days-   6/8-9: Downtrending   6/16 weights appear stable, no signs of fluid overload Filed Weights   09/02/22 0500 09/03/22 0500 09/04/22 0500  Weight: 75 kg 72.8 kg 73.5 kg     12: Aortic stenosis s/p TAVR 12/2021   13: History of CAD: Continue statin, aspirin, Eliquis             -follows with Dr. Allyson Sabal   14: Anemia of chronic disease/ABLA: follow-up CBC   6/4- Hb down to 7.9 from 8.5  Recheck tomorrow  15: CKD stage 3a: currently Scr at baseline>>1.28; follow-up BMP   6/4- Cr down to 1.14 from 1.28 and Cr 35 up from 31- will recheck Thursday   6/10 - Cr stable at 1.22, BUN up to 77, continue to hold lasix , recheck Wednesday  6/15 Cr stable 1.41, BUN 63 today, hold lasix   Recheck tomorrow  16: PAD: continue statin, aspirin, Eliquis   17: Hyponatremia; chronic: Lasix held; follow-up BMP   6/4- Na 131- on high side for him- will monitor  6/15 Na sable 131 today     Latest Ref Rng & Units 09/03/2022    5:27 AM 09/02/2022    1:33 PM 09/02/2022    6:33 AM  BMP  Glucose 70 - 99 mg/dL 161  096  045   BUN 8 - 23 mg/dL 63  65  62   Creatinine 0.61 - 1.24 mg/dL 4.09  8.11  9.14   Sodium 135 - 145 mmol/L 131  130  131   Potassium 3.5 - 5.1 mmol/L 4.8  5.0  5.4   Chloride 98 - 111 mmol/L 95  96  95   CO2 22 - 32 mmol/L 26  25  27    Calcium 8.9 - 10.3 mg/dL 9.1  9.1  9.2    6/7 stable mild hypoNa+, BUN still up cont hold furosemide  6/14 Na stable at 131  18: Leukocytosis: no fever or known infection;  follow-up CBC with diff   6/4- WBC down to normal levels- 10k- will monitor 19: Thrombocytosis: upward trend; follow-up CBC   6/4- Plts stable at 559k, likely reactive 20: GERD: continue Protonix   21: Elevated transaminases: improved; follow-up CMP   AST very slightly elevated at 42- but otherwise resolved  22: Diabetic neuropathy: continue gabapentin 300 mg TID   - 6/9: Last 2 Cr WNL; given ongoing phantom pains QHS increase gabapentin to 300/300/600 mg  23. Constipation  6/5- will give Sorbitol 30cc since no BM in 3 days except very small one last night  6/6- had good BM's.   6/11 reports large BM yesterday, will add PRN miralax  6/12 schedule miralax   LBM 6/13  24. R forearm- IV infiltration  6/6- will order warm compresses for R forearm  25. Hyperkalemia  -6/14, initially 6.11 but then 5.4 on recheck, given lokelma  -6/15 K+ down to 4.8 today, recheck Monday  -Will restart Nepro, Glucerna was discontinued   LOS: 13 days A FACE TO FACE EVALUATION WAS PERFORMED  Fanny Dance 09/04/2022, 7:49 PM

## 2022-09-04 NOTE — Progress Notes (Signed)
Physical Therapy Discharge Summary  Patient Details  Name: Caleb [name] MRN: [id number] Date of Birth: 1950/09/13  Date of Discharge from PT service:September 04, 2022  Today's Date: 09/04/2022 PT Individual Time: 1300-1345 PT Individual Time Calculation (min): 45 min    Patient has met 9 of 9 long term goals due to improved activity tolerance, improved balance, improved postural control, increased strength, increased range of motion, decreased pain, ability to compensate for deficits, and improved coordination.  Patient to discharge at a wheelchair level Modified Independent   Reasons goals not met: N/A  Recommendation:  Patient will benefit from ongoing skilled PT services in home health setting to continue to advance safe functional mobility, address ongoing impairments in coordination, balance, activity tolerance, and minimize fall risk.  Equipment: Amputee pad, w/c, rolling walker   Reasons for discharge: treatment goals met and discharge from hospital  Patient/family agrees with progress made and goals achieved: Yes  PT Discharge Pain Interference Pain Interference Pain Effect on Sleep: 0. Does not apply - I have not had any pain or hurting in the past 5 days Pain Interference with Therapy Activities: 0. Does not apply - I have not received rehabilitationtherapy in the past 5 days Pain Interference with Day-to-Day Activities: 1. Rarely or not at all Cognition Overall Cognitive Status: Within Functional Limits for tasks assessed Arousal/Alertness: Awake/alert Orientation Level: Oriented X4 Memory: Appears intact Awareness: Appears intact Problem Solving: Appears intact Safety/Judgment: Appears intact Sensation Sensation Light Touch: Impaired Detail Peripheral sensation comments: Neuropathy present in bilateral hands and foot. Light Touch Impaired Details: Impaired RUE;Impaired LUE;Impaired LLE;Impaired RLE Hot/Cold: Appears Intact Proprioception: Appears  Intact Additional Comments: grossly impaired due to neuropathy Coordination Gross Motor Movements are Fluid and Coordinated: No Fine Motor Movements are Fluid and Coordinated: Yes Coordination and Movement Description: Altered balance strategies due to R BKA and neuropathy Finger Nose Finger Test: [facility] Heel Shin Test: unable to perform on RLE due to BKA, WFL on LLE Motor  Motor Motor: Other (comment) (right BKA) Motor - Skilled Clinical Observations: Altered balance strategies due to R BKA  Mobility Bed Mobility Bed Mobility: Rolling Right;Rolling Left;Sit to Supine;Supine to Sit Rolling Right: Independent Rolling Left: Independent Supine to Sit: Independent Sit to Supine: Independent Transfers Transfers: Sit to Stand;Stand to Sit Sit to Stand: Independent with assistive device Stand to Sit: Independent with assistive device Stand Pivot Transfers: Independent with assistive device Transfer (Assistive device): Rolling walker Locomotion  Gait Ambulation: Yes Gait Assistance: Supervision/Verbal cueing Gait Distance (Feet): 67 Feet Assistive device: Rolling walker Gait Gait: Yes Gait Pattern: Impaired Gait Pattern: Shuffle Gait velocity: decreased Stairs / Additional Locomotion Stairs: Yes Stairs Assistance: Minimal Assistance - Patient > 75% Stair Management Technique: One rail Left;Other (comment) (shower chair) Number of Stairs: 3 Height of Stairs: 6 Ramp: Supervision/Verbal cueing Curb: Minimal Assistance - Patient >75% Wheelchair Mobility Wheelchair Mobility: Yes Wheelchair Assistance: Independent with Scientist, research (life sciences): Both upper extremities Wheelchair Parts Management: Independent Distance: >1,000  Trunk/Postural Assessment  Cervical Assessment Cervical Assessment: Within Functional Limits Thoracic Assessment Thoracic Assessment: Within Functional Limits Lumbar Assessment Lumbar Assessment: Within Functional Limits Postural  Control Postural Control: Deficits on evaluation Protective Responses: delayed  Balance Balance Balance Assessed: Yes Static Sitting Balance Static Sitting - Balance Support: Feet supported Static Sitting - Level of Assistance: 6: Modified independent (Device/Increase time) Dynamic Sitting Balance Dynamic Sitting - Balance Support: During functional activity Dynamic Sitting - Level of Assistance: 6: Modified independent (Device/Increase time) Static Standing Balance Static Standing -  Balance Support: Bilateral upper extremity supported;During functional activity Static Standing - Level of Assistance: 6: Modified independent (Device/Increase time) Dynamic Standing Balance Dynamic Standing - Balance Support: Bilateral upper extremity supported;During functional activity Dynamic Standing - Level of Assistance: 6: Modified independent (Device/Increase time) Extremity Assessment  RUE Assessment RUE Assessment: Within Functional Limits LUE Assessment LUE Assessment: Within Functional Limits RLE Assessment RLE Assessment: Exceptions to [facility] RLE Strength Right Hip Flexion: 4/5 Right Hip Extension: 4/5 Right Hip ABduction: 4/5 Right Hip ADduction: 4/5 Right Knee Extension: 4/5 LLE Assessment LLE Assessment: Within Functional Limits LLE Strength LLE Overall Strength: Within Functional Limits for tasks assessed Left Hip Flexion: 4+/5 Left Hip Extension: 4+/5 Left Hip ABduction: 4+/5 Left Hip ADduction: 4+/5 Left Knee Flexion: 4+/5 Left Knee Extension: 4+/5 Left Ankle Dorsiflexion: 4+/5 Left Ankle Plantar Flexion: 4+/5   PT Treatment Session:   Pt received semi-reclined in bed, agreeable to PT and without reports of pain. PT assessed pain interference, sensation, coordination, strength and balance in preparation for discharge. Pt ambulated ~50 ft with (S) for safety with good form and foot clearance of L LE. PT educated pt on energy conservation and ambulation of limited distances  for safety and protection of L LE until patient receives his prosthesis. Pt independent for w/c mobility >300 ft in session and mod I with squat pivot transfer and (I) for sit to lying. Pt left semi-reclined in bed with all needs in reach.   Truitt Leep Truitt Leep PT, DPT  09/04/2022, 1:20 PM

## 2022-09-05 ENCOUNTER — Telehealth: Payer: Self-pay | Admitting: Orthopedic Surgery

## 2022-09-05 DIAGNOSIS — S88111A Complete traumatic amputation at level between knee and ankle, right lower leg, initial encounter: Secondary | ICD-10-CM | POA: Diagnosis not present

## 2022-09-05 LAB — CBC
HCT: 32.1 % — ABNORMAL LOW (ref 39.0–52.0)
Hemoglobin: 9.9 g/dL — ABNORMAL LOW (ref 13.0–17.0)
MCH: 26.3 pg (ref 26.0–34.0)
MCHC: 30.8 g/dL (ref 30.0–36.0)
MCV: 85.1 fL (ref 80.0–100.0)
Platelets: 421 10*3/uL — ABNORMAL HIGH (ref 150–400)
RBC: 3.77 MIL/uL — ABNORMAL LOW (ref 4.22–5.81)
RDW: 17.9 % — ABNORMAL HIGH (ref 11.5–15.5)
WBC: 5.8 10*3/uL (ref 4.0–10.5)
nRBC: 0 % (ref 0.0–0.2)

## 2022-09-05 LAB — BASIC METABOLIC PANEL
Anion gap: 12 (ref 5–15)
BUN: 47 mg/dL — ABNORMAL HIGH (ref 8–23)
CO2: 25 mmol/L (ref 22–32)
Calcium: 9.2 mg/dL (ref 8.9–10.3)
Chloride: 98 mmol/L (ref 98–111)
Creatinine, Ser: 1.33 mg/dL — ABNORMAL HIGH (ref 0.61–1.24)
GFR, Estimated: 57 mL/min — ABNORMAL LOW (ref >60)
Glucose, Bld: 120 mg/dL — ABNORMAL HIGH (ref 70–99)
Potassium: 4.5 mmol/L (ref 3.5–5.1)
Sodium: 135 mmol/L (ref 135–145)

## 2022-09-05 LAB — GLUCOSE, CAPILLARY: Glucose-Capillary: 119 mg/dL — ABNORMAL HIGH (ref 70–99)

## 2022-09-05 MED ORDER — ATORVASTATIN CALCIUM 40 MG PO TABS: 40.0000 mg | ORAL_TABLET | Freq: Every morning | ORAL | 0 refills | Status: DC

## 2022-09-05 MED ORDER — MELATONIN 3 MG PO TABS: 3.0000 mg | ORAL_TABLET | Freq: Every evening | ORAL | 0 refills | Status: AC | PRN

## 2022-09-05 MED ORDER — TRIAMCINOLONE ACETONIDE 0.1 % EX CREA
TOPICAL_CREAM | Freq: Two times a day (BID) | CUTANEOUS | Status: DC
  Filled 2022-09-05: qty 15

## 2022-09-05 MED ORDER — INSULIN NPH ISOPHANE & REGULAR (70-30) 100 UNIT/ML ~~LOC~~ SUSP: 5.0000 [IU] | Freq: Two times a day (BID) | SUBCUTANEOUS | 11 refills | Status: DC

## 2022-09-05 MED ORDER — CARVEDILOL 3.125 MG PO TABS: 3.1250 mg | ORAL_TABLET | Freq: Two times a day (BID) | ORAL | 0 refills | Status: DC

## 2022-09-05 MED ORDER — DOCUSATE SODIUM 100 MG PO CAPS: 100.0000 mg | ORAL_CAPSULE | Freq: Every day | ORAL | 0 refills | Status: DC

## 2022-09-05 MED ORDER — GABAPENTIN 300 MG PO CAPS: ORAL_CAPSULE | ORAL | 0 refills | Status: DC

## 2022-09-05 MED ORDER — HYDROCODONE-ACETAMINOPHEN 5-325 MG PO TABS: 1.0000 | ORAL_TABLET | Freq: Four times a day (QID) | ORAL | 0 refills | Status: AC | PRN

## 2022-09-05 MED ORDER — BISACODYL 5 MG PO TBEC: 5.0000 mg | DELAYED_RELEASE_TABLET | Freq: Every day | ORAL | 0 refills | Status: AC | PRN

## 2022-09-05 MED ORDER — TRIAMCINOLONE ACETONIDE 0.1 % EX CREA: TOPICAL_CREAM | Freq: Two times a day (BID) | CUTANEOUS | 0 refills | Status: AC

## 2022-09-05 MED ORDER — HYDROCODONE-ACETAMINOPHEN 5-325 MG PO TABS: 1.0000 | ORAL_TABLET | Freq: Four times a day (QID) | ORAL | 0 refills | Status: DC | PRN

## 2022-09-05 MED ORDER — ACETAMINOPHEN 325 MG PO TABS: 325.0000 mg | ORAL_TABLET | ORAL | Status: AC | PRN

## 2022-09-05 MED ORDER — BISACODYL 5 MG PO TBEC: 5.0000 mg | DELAYED_RELEASE_TABLET | Freq: Every day | ORAL | 0 refills | Status: DC | PRN

## 2022-09-05 MED ORDER — POLYETHYLENE GLYCOL 3350 17 G PO PACK: 17.0000 g | PACK | Freq: Every day | ORAL | 0 refills | Status: DC

## 2022-09-05 MED ORDER — ASPIRIN EC 81 MG PO TBEC: 81.0000 mg | DELAYED_RELEASE_TABLET | Freq: Every morning | ORAL | 0 refills | Status: DC

## 2022-09-05 MED ORDER — DOCUSATE SODIUM 100 MG PO CAPS: 100.0000 mg | ORAL_CAPSULE | Freq: Every day | ORAL | 0 refills | Status: AC

## 2022-09-05 MED ORDER — MELATONIN 3 MG PO TABS: 3.0000 mg | ORAL_TABLET | Freq: Every evening | ORAL | 0 refills | Status: DC | PRN

## 2022-09-05 MED ORDER — TRIAMCINOLONE ACETONIDE 0.1 % EX CREA: TOPICAL_CREAM | Freq: Two times a day (BID) | CUTANEOUS | 0 refills | Status: DC

## 2022-09-05 MED ORDER — POLYETHYLENE GLYCOL 3350 17 G PO PACK: 17.0000 g | PACK | Freq: Every day | ORAL | 0 refills | Status: AC

## 2022-09-05 NOTE — Telephone Encounter (Signed)
Spoke w/Angela w/[facility] [phone], she wanted to make [place] aware that they will be going to see the patient on Thursday, 09/08/22.

## 2022-09-05 NOTE — Progress Notes (Signed)
PROGRESS NOTE   Subjective/Complaints:  Pt reports ok on Norco Pain doing well.  R 2nd digit bruised and skin flaking/almost looks like eczema on tip-  worried about it Also asking about getting same w/c cushion been using here.  ROS:  Pt denies SOB, abd pain, CP, N/V/C/D, and vision changes  Objective:   No results found. Recent Labs    09/05/22 0604  WBC 5.8  HGB 9.9*  HCT 32.1*  PLT 421*   Recent Labs    09/03/22 0527 09/05/22 0604  NA 131* 135  K 4.8 4.5  CL 95* 98  CO2 26 25  GLUCOSE 139* 120*  BUN 63* 47*  CREATININE 1.41* 1.33*  CALCIUM 9.1 9.2    Intake/Output Summary (Last 24 hours) at 09/05/2022 0818 Last data filed at 09/05/2022 0802 Gross per 24 hour  Intake 1431 ml  Output 1400 ml  Net 31 ml        Physical Exam: Vital Signs Blood pressure 113/77, pulse 81, temperature 97.7 F (36.5 C), resp. rate 18, height 5' 6.93" (1.7 m), weight 73.4 kg, SpO2 100 %.   General: awake, alert, appropriate, Sitting on toilet;  NAD HENT: conjugate gaze; oropharynx moist CV: regular rate; no JVD Pulmonary: CTA B/L; no W/R/R- good air movement GI: soft, NT, ND, (+)BS Psychiatric: appropriate Neurological: Ox3 Skin; limb R BKA shaping well- shrinker in place; R 2nd digit- tip- has bruising like from finger sticks as well as some flaking skin-  Musculoskeletal:        No joint swelling noted    Neurological:     General: No focal deficit present.  Cranial nerves II through XII grossly intact    Mental Status: He is alert and oriented to person, place, and time.   Motor 4/5 Bilateral grip 5/5 in bialteral delt , Bi, Tri, Hip flexors left knee ext, 3- Left ankle DF PF Sensation absent to LT below the midcalf  Left foot and bilateral hand intrinsice atrophy  MSK- Left foot with 4th and 5th toe amps well healed  Psychiatric:        Mood and Affect: Mood normal.        Behavior: Behavior normal.    \    Assessment/Plan: 1. Functional deficits which require 3+ hours per day of interdisciplinary therapy in a comprehensive inpatient rehab setting. Physiatrist is providing close team supervision and 24 hour management of active medical problems listed below. Physiatrist and rehab team continue to assess barriers to discharge/monitor patient progress toward functional and medical goals  Care Tool:  Bathing    Body parts bathed by patient: Right arm, Left arm, Chest, Abdomen, Front perineal area, Buttocks, Face, Left lower leg, Left upper leg, Right upper leg     Body parts n/a: Right lower leg   Bathing assist Assist Level: Independent with assistive device     Upper Body Dressing/Undressing Upper body dressing   What is the patient wearing?: Pull over shirt    Upper body assist Assist Level: Independent with assistive device    Lower Body Dressing/Undressing Lower body dressing      What is the patient wearing?: Pants, Underwear/pull up  Lower body assist Assist for lower body dressing: Independent with assitive device     Toileting Toileting    Toileting assist Assist for toileting: Independent with assistive device     Transfers Chair/bed transfer  Transfers assist     Chair/bed transfer assist level: Independent with assistive device     Locomotion Ambulation   Ambulation assist      Assist level: Supervision/Verbal cueing Assistive device: Walker-rolling Max distance: 67 ft   Walk 10 feet activity   Assist     Assist level: Supervision/Verbal cueing Assistive device: Walker-rolling   Walk 50 feet activity   Assist Walk 50 feet with 2 turns activity did not occur: Safety/medical concerns (pain)  Assist level: Supervision/Verbal cueing Assistive device: Walker-rolling    Walk 150 feet activity   Assist Walk 150 feet activity did not occur: Safety/medical concerns (pain, fatigue)         Walk 10 feet on uneven  surface  activity   Assist Walk 10 feet on uneven surfaces activity did not occur: Safety/medical concerns (pain)   Assist level: Contact Guard/Touching assist     Wheelchair     Assist Is the patient using a wheelchair?: Yes Type of Wheelchair: Manual    Wheelchair assist level: Independent Max wheelchair distance: >1000 ft    Wheelchair 50 feet with 2 turns activity    Assist        Assist Level: Independent   Wheelchair 150 feet activity     Assist      Assist Level: Independent   Blood pressure 113/77, pulse 81, temperature 97.7 F (36.5 C), resp. rate 18, height 5' 6.93" (1.7 m), weight 73.4 kg, SpO2 100 %.  Medical Problem List and Plan: 1. Functional deficits secondary to Right BKA secondary to osteomyelitis RIght foot             -patient may  shower since wound vac is removed             -ELOS/Goals: 7-10d Sup/Mod I goals   Con't CIR PT and OT- ~ 2 weeks  -Estimated DC 6/17  -Discussed fall prevention  -Expected discharge tomorrow  6/17- d/c today- wil need f/u with Dr Lajoyce Corners and myself  2.  Antithrombotics: -DVT/anticoagulation:  Pharmaceutical: Eliquis             -antiplatelet therapy: aspirin 81 mg daily   3. Pain Management: Tylenol as needed             -continue gabapentin 300 mg TID   6/4- on Norco as needed q4 hours prn for pain- is working so far  6/5- piriformis syndrome- will start stretching and tennis ball usage; Also change Norco to Dilaudid since don't want to increase Norco due to tylenol- and cannot tolerate Oxy- 2 mg q4 hours prn  6/6- will d/w therapy about piriformis stretches- pain doing better on Dliaudid than Norco- better controlled  6/8: Discussed massage for phangom limb pains. Overall better.   6/9: See below; increase gabapentin to 300/300/600 mg  6/10 phantom pain improved, continue current regimen  6/12 no further phantom pain,continue current regimen  6/13 had episode of residual limb pain- improved with  diluadid prn,, reports phantom pain controlled  6/14 will try decrease dilaudid to 1mg   PRN  6/15 continue 1mg   dilaudid PRN as he reports this is keeping pain controlled, he reports phantom pain is controlled. Reports nausea with tramadol, oxycodone in past  6/16 will restart Norco 5 mg and DC Dilaudid,  appears he was tolerating this medicine previously  6/17- tolerating norco- will go home on this- will get 1 week and then needs to call for refill  4. Mood/Behavior/Sleep: LCSW to evaluate and provide emotional support             -antipsychotic agents: n/a   5. Neuropsych/cognition: This patient is capable of making decisions on his own behalf.   6. Skin/Wound Care: Routine skin care checks             -remove Prevena in one week   6/6- due to be removed tomorrow- however since having new R distal/near incision pain- will ask Dr Lajoyce Corners if can remove today - removed  6/17- healing well- shaping well 7. Fluids/Electrolytes/Nutrition: Routine Is and Os and follow-up chemistries             -continue vitamin C, Nepro, Juven, zinc   8: Hypertension: monitor TID and prn; Lasix and hydralazine held due to soft BP             -continue carvedilol 3.125 mg BID             -continue Imdur 15 mg daily  6/15 well controlled overall     09/05/2022    5:12 AM 09/04/2022    8:04 PM 09/04/2022    1:09 PM  Vitals with BMI  Weight 161 lbs 13 oz    BMI 25.4    Systolic 113 111 161  Diastolic 77 74 78  Pulse 81 83 80    9: Hyperlipidemia: continue statin   10: DM-2: A1c = 7.9%; CBGs QID; home regimen NPH 70/30 8 units before breakfast and 7 units before supper not restarted             -continue SSI   6/4- Will restart insulin once CBGs a little higher- right now mainly mid 100s- with 1 value 211- I'm concerned about starting insulin at thiese levels- don't want hypoglycemia  6/6- CBGs 153-184 in last 24 hours- will restart 70/30- because he wants home medicine used- 4 units BID-meals and monitor  trend   CBG (last 3)  Recent Labs    09/04/22 1705 09/04/22 2053 09/05/22 0535  GLUCAP 121* 132* 119*   Increase 70/30 to 5U BID 6/7 ( will likely need to titrate up, has been receiving 6-7U novalog per day with SSI) 6/16 well-controlled, continue current regimen  6/17- CBGs well controlled- con't regimen 11: HFrEF: current EF ~25-30%; Lasix and hydralazine on hold             -daily weight , monitor for fluid overload             -continue Imdur 15 mg daily             -continue Carvedilol 3.125 mg BID   6/4- Weight down 1 kg- will monitor daily-   6/6- no weight in 2 days-   6/8-9: Downtrending   6/16 weights appear stable, no signs of fluid overload Filed Weights   09/03/22 0500 09/04/22 0500 09/05/22 0512  Weight: 72.8 kg 73.5 kg 73.4 kg     12: Aortic stenosis s/p TAVR 12/2021   13: History of CAD: Continue statin, aspirin, Eliquis             -follows with Dr. Allyson Sabal   14: Anemia of chronic disease/ABLA: follow-up CBC   6/4- Hb down to 7.9 from 8.5  Recheck tomorrow  15: CKD stage 3a: currently Scr at baseline>>1.28; follow-up  BMP   6/4- Cr down to 1.14 from 1.28 and Cr 35 up from 31- will recheck Thursday   6/10 - Cr stable at 1.22, BUN up to 77, continue to hold lasix , recheck Wednesday  6/15 Cr stable 1.41, BUN 63 today, hold lasix   Recheck tomorrow  6/17- Cr 1.33 and BUN better at 47 16: PAD: continue statin, aspirin, Eliquis   17: Hyponatremia; chronic: Lasix held; follow-up BMP   6/4- Na 131- on high side for him- will monitor  6/15 Na stable 131 today     Latest Ref Rng & Units 09/05/2022    6:04 AM 09/03/2022    5:27 AM 09/02/2022    1:33 PM  BMP  Glucose 70 - 99 mg/dL 960  454  098   BUN 8 - 23 mg/dL 47  63  65   Creatinine 0.61 - 1.24 mg/dL 1.19  1.47  8.29   Sodium 135 - 145 mmol/L 135  131  130   Potassium 3.5 - 5.1 mmol/L 4.5  4.8  5.0   Chloride 98 - 111 mmol/L 98  95  96   CO2 22 - 32 mmol/L 25  26  25    Calcium 8.9 - 10.3 mg/dL 9.2  9.1   9.1    6/7 stable mild hypoNa+, BUN still up cont hold furosemide  6/14 Na stable at 131  6/17- NA 135 18: Leukocytosis: no fever or known infection; follow-up CBC with diff   6/4- WBC down to normal levels- 10k- will monitor  6/17- WBC 5.8o  19: Thrombocytosis: upward trend; follow-up CBC   6/4- Plts stable at 559k, likely reactive  6/17- Plts 421k- stable 20: GERD: continue Protonix   21: Elevated transaminases: improved; follow-up CMP   AST very slightly elevated at 42- but otherwise resolved  22: Diabetic neuropathy: continue gabapentin 300 mg TID   - 6/9: Last 2 Cr WNL; given ongoing phantom pains QHS increase gabapentin to 300/300/600 mg  6/17- Cr 1.33- slightly elevated- but stable for him- BUN 47- down from 60's 23. Constipation  6/5- will give Sorbitol 30cc since no BM in 3 days except very small one last night  6/6- had good BM's.   6/11 reports large BM yesterday, will add PRN miralax  6/12 schedule miralax   LBM 6/13  6/17- on toilet having BM 24. R forearm- IV infiltration  6/6- will order warm compresses for R forearm  25. Hyperkalemia  -6/14, initially 6.11 but then 5.4 on recheck, given lokelma  -6/15 K+ down to 4.8 today, recheck Monday  -Will restart Nepro, Glucerna was discontinued  6/17- K+ 4.5 26. R 2nd digit- bruising/skin flaking  6/17 - will add Triamcinolone cream for skin  flaking- BID- but sotp when skin looks better  I spent a total of 31   minutes on total care today- >50% coordination of care- due to d/w pt about R finger as well as f/u with me and surgeon- also about cushion on w/c- pt wants the same cushion, but hasn't received his yet, so don't know what he's getting.    LOS: 14 days A FACE TO FACE EVALUATION WAS PERFORMED  Abryana Lykens 09/05/2022, 8:18 AM

## 2022-09-05 NOTE — Telephone Encounter (Signed)
Pt d/c from the hospital today s/p BKA. Vac was removed in CIR 08/29/2022. Tried to call pt to make an appt for in office follow up and mailbox is full unable to leave a message. Will hold and try again.

## 2022-09-05 NOTE — Progress Notes (Addendum)
Inpatient Rehabilitation Care Coordinator Discharge Note   Patient Details  Name: Caleb [name] MRN: [id number] Date of Birth: 12-Sep-1950   Discharge location: HOME TO COUSIN'S HOME FOR TWO WEEKS THEN BACK TO HIS HOME  Length of Stay: 14 DAYS  Discharge activity level: MOD/I LEVEL  Home/community participation: ACTIVE  Patient response WU:[id number] Literacy - How often do you need to have someone help you when you read instructions, pamphlets, or other written material from your doctor or pharmacy?: Never  Patient response YN:WGNFAO Isolation - How often do you feel lonely or isolated from those around you?: Never  Services provided included: MD, RD, PT, OT, RN, CM, TR, Pharmacy, Neuropsych, SW  Financial Services:  Field seismologist Utilized: Other (Comment) ([facility])    Choices offered to/list presented to: PT  Follow-up services arranged:  Home Health, DME, Patient/Family has no preference for HH/DME agencies Home Health Agency: [facility]  PT  OT  RN    DME : [facility] R-AMPUTEE PAD, DROP-ARM BEDSIDE COMMODE AND TUB BENCH  DC SUMMARY SENT TO [facility]  Patient response to transportation need: Is the patient able to respond to transportation needs?: Yes In the past 12 months, has lack of transportation kept you from medical appointments or from getting medications?: No In the past 12 months, has lack of transportation kept you from meetings, work, or from getting things needed for daily living?: No   Patient/Family verbalized understanding of follow-up arrangements:  Yes  Individual responsible for coordination of the follow-up plan: SELF [phone]  Confirmed correct DME delivered: Lucy Chris 09/05/2022    Comments (or additional information):PT DID WELL WILL BE ALONE WHEN AT COUSIN'S SINCE SHE WORKS DURING THE DAY. FEELS COMFORTABLE WITH DISCHARGE HOME TODAY  Summary of Stay    Date/Time Discharge Planning CSW  08/29/22 1131 Pt to go to  cousin's for two weeks then back to his apartment, needs to be mod/i since cousin works. Working on Armed forces training and education officer from [employer] sent 6/7. RGD  08/23/22 0932 new evaluation plan to go to cousin's for two weeks she does work during the day and has three steps into her apartment. Plans to be mod/i wheelchair level at DC RGD       Caleb [name], Caleb [name]

## 2022-09-05 NOTE — Progress Notes (Signed)
Inpatient Rehabilitation Discharge Medication Review by a Pharmacist  A complete drug regimen review was completed for this patient to identify any potential clinically significant medication issues.  High Risk Drug Classes Is patient taking? Indication by Medication  Antipsychotic No   Anticoagulant Yes Eliquis- PAD  Antibiotic No   Opioid Yes Norco- acute pain  Antiplatelet Yes Aspirin- PAD  Hypoglycemics/insulin Yes Insulin- T2DM  Vasoactive Medication Yes Coreg, Imdur- HTN Papaverine/Phentolamine/Prostaglandin inj- ED  Chemotherapy No   Other Yes Lipitor- HLD Gabapentin- neuropathic pain Melatonin- sleep     Type of Medication Issue Identified Description of Issue Recommendation(s)  Drug Interaction(s) (clinically significant)     Duplicate Therapy     Allergy     No Medication Administration End Date     Incorrect Dose     Additional Drug Therapy Needed     Significant med changes from prior encounter (inform family/care partners about these prior to discharge).    Other       Clinically significant medication issues were identified that warrant physician communication and completion of prescribed/recommended actions by midnight of the next day:  No    Time spent performing this drug regimen review (minutes):  30   Marieliz Strang BS, PharmD, BCPS Clinical Pharmacist 09/05/2022 11:08 AM  Contact: [phone] after 3 PM  "Be curious, not judgmental..." -Debbora Dus

## 2022-09-06 ENCOUNTER — Telehealth: Payer: Self-pay | Admitting: Orthopedic Surgery

## 2022-09-06 ENCOUNTER — Telehealth: Payer: Self-pay | Admitting: *Deleted

## 2022-09-06 ENCOUNTER — Telehealth: Payer: Self-pay

## 2022-09-06 NOTE — Progress Notes (Signed)
  Care Coordination  Note  09/06/2022 Name: Caleb [name] MRN: [id number] DOB: 22-May-1950  [name] is a 72 y.o. year old primary care patient of Hoy Register, MD. I reached out to [name] by phone today to assist with scheduling a follow up appointment. [name] verbally consented to my assistance.       Follow up plan: Hospital Follow Up appointment scheduled with Bertram Denver 6/21 10:50 AM.   Gwenevere Ghazi  Care Coordination Care Guide  Direct Dial: [phone]

## 2022-09-06 NOTE — Telephone Encounter (Signed)
Pt has an appt 09/20/2022

## 2022-09-06 NOTE — Telephone Encounter (Signed)
Patient called stating Hydrocodone is not really knocking down the pain like he was in the hospital, Pt has fall last night said very little blood when he checked it.

## 2022-09-06 NOTE — Telephone Encounter (Signed)
I called the pt and offered appt for tomorrow he is on a blood thinner and had direct impact fall on BKA. Pt states that he is not able to get out of the house there are " three steps in the house" states that he is not able to get out of the home but rates his pain a zero currently and states that his HHN is coming to the house Thursday and will check the incision. I advised for the pt to have the [facility] to call and advised what the incision looks like and if he needs to come in sooner we can sch that appt.Pt voiced understanding and states that he will call with any changes.

## 2022-09-06 NOTE — Transitions of Care (Post Inpatient/ED Visit) (Signed)
   09/06/2022  Name: Caleb [name] MRN: [id number] DOB: Dec 17, 1950  Today's TOC FU Call Status: Today's TOC FU Call Status:: Unsuccessul Call (1st Attempt) Unsuccessful Call (1st Attempt) Date: 09/06/22  Attempted to reach the patient regarding the most recent Inpatient/ED visit.  Follow Up Plan: Additional outreach attempts will be made to reach the patient to complete the Transitions of Care (Post Inpatient/ED visit) call.     Antionette Fairy, RN,BSN,CCM [facility] Care Management Care Management Community Coordinator Direct Phone: [phone] Toll Free: [phone] Fax: [phone]

## 2022-09-07 ENCOUNTER — Telehealth: Payer: Self-pay

## 2022-09-07 NOTE — Transitions of Care (Post Inpatient/ED Visit) (Signed)
09/07/2022  Name: Caleb [name] MRN: [id number] DOB: [birth year]  Today's TOC FU Call Status: Today's TOC FU Call Status:: Successful TOC FU Call Competed TOC FU Call Complete Date: 09/07/22  Transition Care Management Follow-up Telephone Call Date of Discharge: 09/05/22 Discharge Facility: [facility]) Type of Discharge: Inpatient Admission Primary Inpatient Discharge Diagnosis:: "unilateral complete BKA,right" How have you been since you were released from the hospital?: Better (Pt voices he is doing much better. Pleased to report pain is controlled/managed. Current pain 2/10-taking meds as ordered. He is getting around in wheelchair. Pt states he fell yest-hit stump-no issues-incision healing-he notified surgeon.) Any questions or concerns?: No  Items Reviewed: Did you receive and understand the discharge instructions provided?: Yes Medications obtained,verified, and reconciled?: Yes (Medications Reviewed) Any new allergies since your discharge?: No Dietary orders reviewed?: Yes Type of Diet Ordered:: low salt/heart healthy/carb modified Do you have support at home?: Yes People in Home: other relative(s) Name of Support/Comfort Primary Source: pt temporairly staying with his cousin for a few wks while he recovers  Medications Reviewed Today: Medications Reviewed Today     Reviewed by Charlyn Minerva, RN (Registered Nurse) on 09/07/22 at 1001  Med List Status: <None>   Medication Order Taking? Sig Documenting Provider Last Dose Status Informant  acetaminophen (TYLENOL) 325 MG tablet [id number] Yes Take 1-2 tablets (325-650 mg total) by mouth every 4 (four) hours as needed for mild pain. Setzer, Lynnell Jude, PA-C Taking Active   apixaban (ELIQUIS) 5 MG TABS tablet [id number] Yes Take 1 tablet (5 mg total) by mouth 2 (two) times daily. Alberteen Sam, MD Taking Active Self  ascorbic acid (VITAMIN C) 1000 MG tablet [id number] Yes Take 1 tablet (1,000 mg total) by  mouth daily. Alberteen Sam, MD Taking Active   aspirin EC 81 MG tablet [id number] Yes Take 1 tablet (81 mg total) by mouth in the morning. Setzer, Lynnell Jude, PA-C Taking Active   atorvastatin (LIPITOR) 40 MG tablet [id number] Yes Take 1 tablet (40 mg total) by mouth in the morning. Setzer, Lynnell Jude, PA-C Taking Active   bisacodyl (DULCOLAX) 5 MG EC tablet [id number] Yes Take 1 tablet (5 mg total) by mouth daily as needed for moderate constipation. Setzer, Lynnell Jude, PA-C Taking Active   carvedilol (COREG) 3.125 MG tablet [id number] Yes Take 1 tablet (3.125 mg total) by mouth 2 (two) times daily with a meal. Setzer, Lynnell Jude, PA-C Taking Active   Cholecalciferol (VITAMIN D-3) 25 MCG (1000 UT) CAPS [id number] Yes Take 1,000 Units by mouth daily. [provider] Taking Active Self  Continuous Blood Gluc Sensor (FREESTYLE LIBRE 14 DAY SENSOR) MISC [id number] Yes Inject 1 Device into the skin every 14 (fourteen) days. [provider] Taking Active Self  docusate sodium (COLACE) 100 MG capsule [id number] Yes Take 1 capsule (100 mg total) by mouth daily. Setzer, Lynnell Jude, PA-C Taking Active   gabapentin (NEURONTIN) 300 MG capsule [id number] Yes Take 1 capsule (300 mg total) by mouth 2 (two) times daily AND 2 capsules (600 mg total) at bedtime. Setzer, Lynnell Jude, PA-C Taking Active   HYDROcodone-acetaminophen (NORCO/VICODIN) 5-325 MG tablet [id number] Yes Take 1 tablet by mouth every 6 (six) hours as needed for moderate pain. Setzer, Lynnell Jude, PA-C Taking Active   insulin NPH-regular Human (70-30) 100 UNIT/ML injection [id number] Yes Inject 5 Units into the skin 2 (two) times daily. With breakfast and with dinner Milinda Antis, PA-C Taking Active   isosorbide mononitrate (IMDUR) 30 MG  24 hr tablet [id number] Yes Take 0.5 tablets (15 mg total) by mouth daily. Leroy Sea, MD Taking Active Self  melatonin 3 MG TABS tablet [id number] Yes Take 1 tablet (3 mg total) by mouth at bedtime as needed  (insomnia). Setzer, Lynnell Jude, PA-C Taking Active   polyethylene glycol (MIRALAX / GLYCOLAX) 17 g packet [id number] Yes Take 17 g by mouth daily. Carlos Levering Taking Active   PRESCRIPTION MEDICATION [id number]  Inject 0.1-1 mLs as directed See admin instructions. Tri-Mix Standard Strength 5 ml. Formula: Prostaglandin 10 mcg/ml, Papaverine 30 mg/ml, Phentolamine 1 mg/ml. Inject 0.1 ml into side of penis as directed as needed (to be injected immediately before sexual intercourse) may increase the dose by 0.1 ml every 48 hours to achieve an erection. Max dose 1 ml.  Patient not taking: Reported on 08/18/2022   [provider]  Active Self           Med Note [facility], CARLOS A   Mon Jan 17, 2022 11:41 AM)    triamcinolone cream (KENALOG) 0.1 % [id number] Yes Apply topically 2 (two) times daily. Setzer, Lynnell Jude, PA-C Taking Active   vitamin B-12 (CYANOCOBALAMIN) 500 MCG tablet [id number] Yes Take 500 mcg by mouth in the morning. [provider] Taking Active Self  zinc sulfate 220 (50 Zn) MG capsule [id number] Yes Take 1 capsule (220 mg total) by mouth daily. Alberteen Sam, MD Taking Active             Home Care and Equipment/Supplies: Were Home Health Services Ordered?: Yes Name of Home Health Agency:: [facility] Has Agency set up a time to come to your home?: Yes First Home Health Visit Date: 09/08/22 Any new equipment or medical supplies ordered?: Yes [facility] am[facility],BSC,tub bench Were you able to get the equipment/medical supplies?: Yes Do you have any questions related to the use of the equipment/supplies?: No  Functional Questionnaire: Do you need assistance with bathing/showering or dressing?: Yes Do you need assistance with meal preparation?: Yes Do you need assistance with eating?: No Do you have difficulty maintaining continence: No Do you need assistance with getting out of bed/getting out of a  chair/moving?: No Do you have difficulty managing or taking your medications?: No  Follow up appointments reviewed: PCP Follow-up appointment confirmed?: Yes Date of PCP follow-up appointment?: 09/09/22 Follow-up Provider: Thalia Party Specialist [facility] Follow-up appointment confirmed?: Yes Date of Specialist follow-up appointment?: 09/20/22 Follow-Up Specialty Provider:: Dr. Lajoyce Corners Do you need transportation to your follow-up appointment?: No (pt uses [employer] transportation for medical appts) Do you understand care options if your condition(s) worsen?: Yes-patient verbalized understanding   TOC Interventions Today    Flowsheet Row Most Recent Value  TOC Interventions   TOC Interventions Discussed/Reviewed TOC Interventions Discussed, Post op wound/incision care, S/S of infection, Post discharge activity limitations per provider      Interventions Today    Flowsheet Row Most Recent Value  Chronic Disease   Chronic disease during today's visit Diabetes  General Interventions   General Interventions Discussed/Reviewed General Interventions Discussed, Doctor Visits  Doctor Visits Discussed/Reviewed Doctor Visits Discussed, PCP, Specialist  Durable Medical Equipment (DME) Glucomoter, BP Cuff  PCP/Specialist Visits Compliance with follow-up visit  Education Interventions   Education Provided Provided Education  Provided Verbal Education On Nutrition, When to see the doctor, Medication, Blood Sugar Monitoring  Nutrition Interventions   Nutrition Discussed/Reviewed Nutrition Discussed, Adding fruits and vegetables, Decreasing salt, Decreasing sugar  intake, Decreasing fats, Increasing proteins  Pharmacy Interventions   Pharmacy Dicussed/Reviewed Pharmacy Topics Discussed, Medications and their functions  Safety Interventions   Safety Discussed/Reviewed Safety Discussed, Fall Risk, Home Safety  [pt will check with [facility] [employer] to see about life alert benefits]  Home Safety Assistive  Devices        Antionette Fairy, [place] [facility] Care Management Care Management Community Coordinator Direct Phone: [phone] Toll Free: [phone] Fax: [phone]

## 2022-09-08 ENCOUNTER — Telehealth: Payer: Self-pay | Admitting: Family Medicine

## 2022-09-08 ENCOUNTER — Ambulatory Visit: Payer: Self-pay

## 2022-09-08 ENCOUNTER — Telehealth: Payer: Self-pay

## 2022-09-08 DIAGNOSIS — L97419 Non-pressure chronic ulcer of right heel and midfoot with unspecified severity: Secondary | ICD-10-CM | POA: Diagnosis not present

## 2022-09-08 DIAGNOSIS — E11621 Type 2 diabetes mellitus with foot ulcer: Secondary | ICD-10-CM | POA: Diagnosis not present

## 2022-09-08 DIAGNOSIS — T8149XA Infection following a procedure, other surgical site, initial encounter: Secondary | ICD-10-CM | POA: Diagnosis not present

## 2022-09-08 DIAGNOSIS — I959 Hypotension, unspecified: Secondary | ICD-10-CM | POA: Diagnosis not present

## 2022-09-08 DIAGNOSIS — E1169 Type 2 diabetes mellitus with other specified complication: Secondary | ICD-10-CM | POA: Diagnosis not present

## 2022-09-08 DIAGNOSIS — M86171 Other acute osteomyelitis, right ankle and foot: Secondary | ICD-10-CM | POA: Diagnosis not present

## 2022-09-08 DIAGNOSIS — T8781 Dehiscence of amputation stump: Secondary | ICD-10-CM | POA: Diagnosis not present

## 2022-09-08 DIAGNOSIS — B951 Streptococcus, group B, as the cause of diseases classified elsewhere: Secondary | ICD-10-CM | POA: Diagnosis not present

## 2022-09-08 DIAGNOSIS — N179 Acute kidney failure, unspecified: Secondary | ICD-10-CM | POA: Diagnosis not present

## 2022-09-08 NOTE — Patient Outreach (Signed)
  Care Coordination   Follow Up Visit Note   09/08/2022 Name: JABARIS CLEMENSON MRN: [id number] DOB: Apr 05, 1950  [name] is a 72 y.o. year old male who sees Hoy Register, MD for primary care. I spoke with  [name] by phone today.  What matters to the patients health and wellness today?  Patient would like to make a full recovery following his recent right BKA.     Goals Addressed             This Visit's Progress    No complications from Decubitus ulcer of right heel       Care Coordination Interventions: Evaluation of current treatment plan related to chronic osteomyelitis of right LE  and patient's adherence to plan as established by provider Determined patient has been discharged home following several readmissions since early May Discussed with patient the outcome of his chronic right LE osteomyelitis resulted in a unilateral right BKA Discussed with patient he is temporarily living with his cousin for about 2 weeks after which he plans to return to his home Determined patient is receiving in home SNV/PT/OT through [facility]  Assessed for DME needs, patient voiced having no DME needs at this time, he will ask his landlord to install grab bars in his shower once he returns to his apartment Discussed with patient his surgical wound is healing, his edema has decreased and he will be fitted for a prothesis in the near future, patient is having some mild phantom pain but states, "I feel fantastic".  Determined patient is transferring without difficulty, however he experienced a fall several evenings ago while in the bathroom, he believes he was moving too quickly trying to urinate, report bumping head on floor but did not seek medical attention  Provided written and verbal education re: potential causes of falls and Fall prevention strategies Reviewed medications and discussed potential side effects of medications such as dizziness and frequent urination Advised  patient of importance of notifying provider of falls Assessed social determinant of health barriers Educated patient about risk for internal bleeding while taking Eliquis and instructed patient to seek medical attention if a fall occurs    Interventions Today    Flowsheet Row Most Recent Value  Chronic Disease   Chronic disease during today's visit Other, Diabetes  [s/p right BKA]  General Interventions   General Interventions Discussed/Reviewed General Interventions Discussed, General Interventions Reviewed, Doctor Visits, Durable Medical Equipment (DME)  Durable Medical Equipment (DME) Bed side commode, Environmental consultant, Wheelchair, Tour manager, Aeronautical engineer  Exercise Interventions   Exercise Discussed/Reviewed Exercise Reviewed, Exercise Discussed, Physical Activity  Physical Activity Discussed/Reviewed Physical Activity Discussed, Physical Activity Reviewed, Home Exercise Program (HEP)  Education Interventions   Education Provided Provided Education  Provided Verbal Education On When to see the doctor, Blood Sugar Monitoring, Nutrition, Medication, Exercise  Nutrition Interventions   Nutrition Discussed/Reviewed Nutrition Discussed, Supplemental nutrition  Pharmacy Interventions   Pharmacy Dicussed/Reviewed Pharmacy Topics Discussed, Pharmacy Topics Reviewed, Medications and their functions  Safety Interventions   Safety Discussed/Reviewed Safety Discussed, Safety Reviewed, Fall Risk, Home Safety  Home Safety Assistive Devices          SDOH assessments and interventions completed:  No     Care Coordination Interventions:  Yes, provided   Follow up plan: Follow up call scheduled for 10/05/22 @12 :30 PM     Encounter Outcome:  Pt. Visit Completed

## 2022-09-08 NOTE — Patient Instructions (Signed)
Visit Information  Thank you for taking time to visit with me today. Please don't hesitate to contact me if I can be of assistance to you.   Following are the goals we discussed today:   Goals Addressed             This Visit's Progress    No complications from Decubitus ulcer of right heel       Care Coordination Interventions: Evaluation of current treatment plan related to chronic osteomyelitis of right LE  and patient's adherence to plan as established by provider Determined patient has been discharged home following several readmissions since early May Discussed with patient the outcome of his chronic right LE osteomyelitis resulted in a unilateral right BKA Discussed with patient he is temporarily living with his cousin for about 2 weeks after which he plans to return to his home Determined patient is receiving in home SNV/PT/OT through [facility]  Assessed for DME needs, patient voiced having no DME needs at this time, he will ask his landlord to install grab bars in his shower once he returns to his apartment Discussed with patient his surgical wound is healing, his edema has decreased and he will be fitted for a prothesis in the near future, patient is having some mild phantom pain but states, "I feel fantastic".  Determined patient is transferring without difficulty, however he experienced a fall several evenings ago while in the bathroom, he believes he was moving too quickly trying to urinate, report bumping head on floor but did not seek medical attention  Provided written and verbal education re: potential causes of falls and Fall prevention strategies Reviewed medications and discussed potential side effects of medications such as dizziness and frequent urination Advised patient of importance of notifying provider of falls Assessed social determinant of health barriers Educated patient about risk for internal bleeding while taking Eliquis and instructed patient to  seek medical attention if a fall occurs         Our next appointment is by telephone on 10/05/22 at 12:30 PM   Please call the care guide team at [phone] if you need to cancel or reschedule your appointment.   If you are experiencing a Mental Health or Behavioral Health Crisis or need someone to talk to, please call 1-800-273-TALK (toll free, 24 hour hotline)  Patient verbalizes understanding of instructions and care plan provided today and agrees to view in MyChart. Active MyChart status and patient understanding of how to access instructions and care plan via MyChart confirmed with patient.     Delsa Sale, RN, BSN, CCM Care Management Coordinator [facility] Care Management Direct Phone: [phone]

## 2022-09-08 NOTE — Telephone Encounter (Signed)
Beth with [facility] called and wanted to know if there are any new orders for patient due to having fall.  Stated that patient has some point tenderness below the incision and a little on the back side of the stump.  CB# [phone].  Please advise.  Thank you.

## 2022-09-08 NOTE — Telephone Encounter (Signed)
Home Health Verbal Orders - Caller/Agency: Beth with [facility] Number: [phone]  Requesting Skilled Nursing Frequency: 1 x wk for 1 wk and 2 xs a wk for 1 week and reevaluation  Beth also wanted to report patient had a fall on Monday where he hit his head on the plastic part of the toilet, This has been reported to his Orthopedic doctor. He does have a small open area on his incision area where he had a right bka. Please assist patient further

## 2022-09-09 ENCOUNTER — Telehealth: Payer: Self-pay

## 2022-09-09 ENCOUNTER — Encounter: Payer: Self-pay | Admitting: Nurse Practitioner

## 2022-09-09 ENCOUNTER — Telehealth: Payer: Self-pay | Admitting: Radiology

## 2022-09-09 ENCOUNTER — Telehealth (HOSPITAL_BASED_OUTPATIENT_CLINIC_OR_DEPARTMENT_OTHER): Payer: Medicare HMO | Admitting: Nurse Practitioner

## 2022-09-09 DIAGNOSIS — Z09 Encounter for follow-up examination after completed treatment for conditions other than malignant neoplasm: Secondary | ICD-10-CM

## 2022-09-09 DIAGNOSIS — D649 Anemia, unspecified: Secondary | ICD-10-CM | POA: Diagnosis not present

## 2022-09-09 DIAGNOSIS — E1169 Type 2 diabetes mellitus with other specified complication: Secondary | ICD-10-CM | POA: Diagnosis not present

## 2022-09-09 DIAGNOSIS — T8781 Dehiscence of amputation stump: Secondary | ICD-10-CM | POA: Diagnosis not present

## 2022-09-09 DIAGNOSIS — T8149XA Infection following a procedure, other surgical site, initial encounter: Secondary | ICD-10-CM | POA: Diagnosis not present

## 2022-09-09 DIAGNOSIS — N179 Acute kidney failure, unspecified: Secondary | ICD-10-CM

## 2022-09-09 DIAGNOSIS — M86171 Other acute osteomyelitis, right ankle and foot: Secondary | ICD-10-CM | POA: Diagnosis not present

## 2022-09-09 DIAGNOSIS — B951 Streptococcus, group B, as the cause of diseases classified elsewhere: Secondary | ICD-10-CM | POA: Diagnosis not present

## 2022-09-09 DIAGNOSIS — L97419 Non-pressure chronic ulcer of right heel and midfoot with unspecified severity: Secondary | ICD-10-CM | POA: Diagnosis not present

## 2022-09-09 DIAGNOSIS — E11621 Type 2 diabetes mellitus with foot ulcer: Secondary | ICD-10-CM | POA: Diagnosis not present

## 2022-09-09 DIAGNOSIS — I959 Hypotension, unspecified: Secondary | ICD-10-CM | POA: Diagnosis not present

## 2022-09-09 NOTE — Telephone Encounter (Signed)
Beth from [facility] advised  that patient should be taking the [place] once a day per last visit on 04/28/22

## 2022-09-09 NOTE — Telephone Encounter (Signed)
Patient called stating that he missed appointment on 5/8. Since then has had a right BKA on 6/3. Patient was wondering if he still needed to be seen.    Tamanna Whitson Lesli Albee, CMA

## 2022-09-09 NOTE — Telephone Encounter (Signed)
Received call from [facility] with [facility] requesting verbal orders. He has done a resumption of care assessment and states patient is at the end of his term. He would like verbal orders for 2x/wk a 1 wk, recert on the second visit, continue 2x/wk x 1 wk and then 1x/wk x 3 wks.  Please call to advise.  CB [phone]

## 2022-09-09 NOTE — Telephone Encounter (Signed)
I called and lm on vm for nurse to advise that I had spoke with the pt an offered multiple appts and he declined stating that he was not able to navigate the three steps out of his home and states that he was "fine" and that he would adjust come in for his scheduled appt on 09/20/2022. He did not report any changes to the incision or any complications arising from the fall. Discussed at length the importance of evaluation but pt refused and states that he will call if anything changes. I advised to continue with current care unless there was an area of concern that the [facility] wanted to discuss and could make adjustments to wound care based off her eval. To call with any questions or changes.

## 2022-09-09 NOTE — Progress Notes (Signed)
Virtual Visit Note  I discussed the limitations, risks, security and privacy concerns of performing an evaluation and management service by video and the availability of in person appointments. I also discussed with the patient that there may be a patient responsible charge related to this service. The patient expressed understanding and agreed to proceed.    I connected with Caleb [name] on 09/09/22  at  10:50 AM EDT  EDT by VIDEO and verified that I am speaking with the correct person using two identifiers.   Location of Patient: Private Residence   Location of Provider: [facility]    Persons participating in VIRTUAL visit: Caleb Denver FNP-BC Caleb [name]    History of Present Illness: VIRTUAL visit for: HFU  [name] is 18days post op R BKA. He was unable to obtain transportation to this appointment today and requested a virtual video visit.   PMH: DM, HTN, HLD, PVD (bilateral TCAR, bilateral popliteal artery stents, right fem-tib bypass, and hx amputation of L 4th and 5th toes as well as right partial calcaneus excision), sCHF EF 20%, AV stenosis s/p TAVR 10/2021 .    HFU 08-18-2022 through 08-22-2022 History of failure to heal his right partial calcaneus excision wound, and was planned for salvage BKA on 5/31 but presented one day early due to untreated pain DX: osteomyelitis of RLE.  Status post right below-knee amputation and application of Kerecis micrograft and Prevena and stump shrinker on 5/31 by Dr. Erven Colla course prolonged by AKI, transaminitis, anemia and soft BP (lasix and hydralazine held)  As of today he has not resumed lasix or hydralazine. Yesterday's BP 110/78. He is not consuming more than 64oz of fluid daily and denies any BLE edeme or dyspnea at this time. He is currently staying at his cousin in law's home and states he does not feel there is enough room there for him to [employer].   He states he will be returning  to his own home over the weekend and will have PT/OT follow up with him in his home instead.  He had a fall Monday night. States he was trying to find his way in the dark in the bathroom and fell and his his head on the plastic part of the toilet. States ortho has been notified and at this time is not experiencing any bleeding or open wounds on his stump. Denies any loss of consciousness or headache.   Requesting an emergency device bracelet. I instructed him that I am not sure this is covered by his insurance.  I will place an order as requested however.   His nepro was discontinued upon dc from the hospital. He would like to resume this as it provides protein and would not greatly affect his potassium like glucerna or ensure.    Past Medical History:  Diagnosis Date   Carotid artery occlusion    CHF (congestive heart failure) (HCC)    Diabetes mellitus without complication (HCC)    Type II   Dyslipidemia 09/27/2012   Erectile dysfunction 09/27/2012   GERD (gastroesophageal reflux disease)    Hypercholesteremia    Hyperlipidemia 08/12/2012   Hypertension    Hyponatremia 08/14/2012   MGUS (monoclonal gammopathy of unknown significance)    Neuropathy    Pancreatitis    Pancreatitis, acute 09/27/2012   Peripheral vascular disease (HCC)    S/P TAVR (transcatheter aortic valve replacement) 01/11/2022   s/p TAVR with a 26 mm Edwards S3UR via the TF  approach by Dr. Excell Seltzer & Bartle   Severe aortic stenosis    Vitamin D deficiency 06/23/2015    Past Surgical History:  Procedure Laterality Date   ABDOMINAL AORTOGRAM W/LOWER EXTREMITY N/A 08/08/2019   Procedure: ABDOMINAL AORTOGRAM W/LOWER EXTREMITY;  Surgeon: Runell Gess, MD;  Location: [facility] INVASIVE CV LAB;  Service: Cardiovascular;  Laterality: N/A;   ABDOMINAL AORTOGRAM W/LOWER EXTREMITY N/A 11/18/2019   Procedure: ABDOMINAL AORTOGRAM W/LOWER EXTREMITY;  Surgeon: Runell Gess, MD;  [facility] LAB;  Service:  Cardiovascular;  Laterality: N/A;   ABDOMINAL AORTOGRAM W/LOWER EXTREMITY N/A 06/13/2022   Procedure: ABDOMINAL AORTOGRAM W/LOWER EXTREMITY;  Surgeon: Chuck Hint, MD;  Location: [facility] INVASIVE CV LAB;  Service: Cardiovascular;  Laterality: N/A;   AMPUTATION Right 08/19/2022   Procedure: RIGHT BELOW KNEE AMPUTATION;  Surgeon: Nadara Mustard, MD;  Location: [facility] OR;  Service: Orthopedics;  Laterality: Right;   APPLICATION OF WOUND VAC  06/24/2022   Procedure: APPLICATION OF WOUND VAC;  Surgeon: Nadara Mustard, MD;  [facility] OR;  Service: Orthopedics;;   CARDIAC CATHETERIZATION     COLONOSCOPY  12 years ago   in [place] clinic= normal exam per pt   ENDARTERECTOMY FEMORAL Right 06/17/2022   Procedure: SUPERFICIAL ENDARTERECTOMY OF COMMON FEMORAL ARTERY;  Surgeon: Victorino Sparrow, MD;  Location: [facility] OR;  Service: Vascular;  Laterality: Right;   FEMORAL-TIBIAL BYPASS GRAFT Right 06/17/2022   Procedure: RIGHT FEMORAL- ANTERIOR TIBIAL ARTERY BYPASS;  Surgeon: Victorino Sparrow, MD;  Location: [facility] OR;  Service: Vascular;  Laterality: Right;   I & D EXTREMITY Right 06/24/2022   Procedure: RIGHT PARTIAL CALCANEUS EXCISION;  Surgeon: Nadara Mustard, MD;  [facility] OR;  Service: Orthopedics;  Laterality: Right;   I & D EXTREMITY Right 07/29/2022   Procedure: RIGHT PARTIAL CALCANEUS EXCISION;  Surgeon: Nadara Mustard, MD;  Location: [facility] OR;  Service: Orthopedics;  Laterality: Right;   INTRAOPERATIVE TRANSTHORACIC ECHOCARDIOGRAM N/A 01/11/2022   Procedure: INTRAOPERATIVE TRANSTHORACIC ECHOCARDIOGRAM;  Surgeon: Tonny Bollman, MD;  Location: [facility] INVASIVE CV LAB;  Service: Open Heart Surgery;  Laterality: N/A;   KNEE SURGERY     MULTIPLE EXTRACTIONS WITH ALVEOLOPLASTY N/A 12/09/2021   Procedure: MULTIPLE EXTRACTION;  Surgeon: Sharman Cheek, DMD;  [facility];  Service: Dentistry;  Laterality: N/A;   PERIPHERAL VASCULAR INTERVENTION Right 08/08/2019   Procedure: PERIPHERAL VASCULAR INTERVENTION;  Surgeon:  Runell Gess, MD;  [facility] LAB;  Service: Cardiovascular;  Laterality: Right;   PERIPHERAL VASCULAR INTERVENTION Left 11/18/2019   Procedure: PERIPHERAL VASCULAR INTERVENTION;  Surgeon: Runell Gess, MD;  [facility];  Service: Cardiovascular;  Laterality: Left;  left popliteal artery   RIGHT/LEFT HEART CATH AND CORONARY ANGIOGRAPHY N/A 10/29/2021   Procedure: RIGHT/LEFT HEART CATH AND CORONARY ANGIOGRAPHY;  Surgeon: Kathleene Hazel, MD;  [facility] INV[facility] LAB;  Service: Cardiovascular;  Laterality: N/A;   TRANSCAROTID ARTERY REVASCULARIZATION  Left 12/16/2019   Procedure: TRANSCAROTID ARTERY REVASCULARIZATION;  Surgeon: Sherren Kerns, MD;  Location: [facility] OR;  Service: Vascular;  Laterality: Left;   TRANSCAROTID ARTERY REVASCULARIZATION  Right 02/03/2020   Procedure: RIGHT TRANSCAROTID ARTERY REVASCULARIZATION;  Surgeon: Sherren Kerns, MD;  Location: [facility] OR;  Service: Vascular;  Laterality: Right;   TRANSCATHETER AORTIC VALVE REPLACEMENT, TRANSFEMORAL N/A 01/11/2022   Procedure: Transcatheter Aortic Valve Replacement, Transfemoral;  Surgeon: Tonny Bollman, MD;  Location: [facility] INVASIVE CV LAB;  Service: Open Heart Surgery;  Laterality: N/A;  ULTRASOUND GUIDANCE FOR VASCULAR ACCESS Right 12/16/2019   Procedure: ULTRASOUND GUIDANCE FOR VASCULAR ACCESS;  Surgeon: Sherren Kerns, MD;  Location: [facility] OR;  Service: Vascular;  Laterality: Right;   ULTRASOUND GUIDANCE FOR VASCULAR ACCESS Right 02/03/2020   Procedure: ULTRASOUND GUIDANCE FOR VASCULAR ACCESS;  Surgeon: Sherren Kerns, MD;  Location: [facility] OR;  Service: Vascular;  Laterality: Right;   VASECTOMY     VEIN HARVEST Right 06/17/2022   Procedure: IPSILATERAL RIGHT GREATER SAPHENOUS VEIN HARVEST;  Surgeon: Victorino Sparrow, MD;  Location: [facility] OR;  Service: Vascular;  Laterality: Right;    Family History  Problem Relation Age of Onset   CAD Father    Hypertension Father    Alcohol  abuse Father        Cause of death   Diabetes Mother    Colon polyps Mother    CAD Brother 22       CABG   Colon cancer Neg Hx    Esophageal cancer Neg Hx    Rectal cancer Neg Hx    Stomach cancer Neg Hx     Social History   Socioeconomic History   Marital status: Widowed    Spouse name: Not on file   Number of children: 3   Years of education: Not on file   Highest education level: High school graduate  Occupational History   Occupation: Caregiver    Comment: Special needs    Comment: retired  Tobacco Use   Smoking status: Never    Passive exposure: Never   Smokeless tobacco: Never  Vaping Use   Vaping Use: Never used  Substance and Sexual Activity   Alcohol use: Yes    Comment: occasional   Drug use: No   Sexual activity: Not Currently  Other Topics Concern   Not on file  Social History Narrative   Widower.  3 daughters and 3 grandchildren.     Social Determinants of Health   Financial Resource Strain: Low Risk  (10/29/2021)   Overall Financial Resource Strain (CARDIA)    Difficulty of Paying Living Expenses: Not hard at all  Food Insecurity: No Food Insecurity (08/19/2022)   Hunger Vital Sign    Worried About Running Out of Food in the Last Year: Never true    Ran Out of Food in the Last Year: Never true  Transportation Needs: No Transportation Needs (08/19/2022)   PRAPARE - Administrator, Civil Service (Medical): No    Lack of Transportation (Non-Medical): No  Physical Activity: Sufficiently Active (12/06/2020)   Exercise Vital Sign    Days of Exercise per Week: 4 days    Minutes of Exercise per Session: 60 min  Stress: No Stress Concern Present (12/06/2020)   [employer] of Occupational Health - Occupational Stress Questionnaire    Feeling of Stress : Not at all  Social Connections: Moderately Isolated (12/06/2020)   Social Connection and Isolation Panel [NHANES]    Frequency of Communication with Friends and Family: More than three times  a week    Frequency of Social Gatherings with Friends and Family: More than three times a week    Attends Religious Services: More than 4 times per year    Active Member of [employer] or Organizations: No    Attends Banker Meetings: Never    Marital Status: Widowed     Observations/Objective: Awake, alert and oriented x 3   Review of Systems  Constitutional:  Negative for fever, malaise/fatigue and weight loss.  HENT: Negative.  Negative for nosebleeds.   Eyes: Negative.  Negative for blurred vision, double vision and photophobia.  Respiratory: Negative.  Negative for cough and shortness of breath.   Cardiovascular: Negative.  Negative for chest pain, palpitations and leg swelling.  Gastrointestinal: Negative.  Negative for heartburn, nausea and vomiting.  Musculoskeletal: Negative.  Negative for myalgias.  Neurological: Negative.  Negative for dizziness, focal weakness, seizures and headaches.  Psychiatric/Behavioral: Negative.  Negative for suicidal ideas.     Assessment and Plan: Diagnoses and all orders for this visit:  Hospital discharge follow-up  Anemia, unspecified type -     CBC with Differential; Future  AKI (acute kidney injury) (HCC) -     CMP14+EGFR; Future     Follow Up Instructions Return in about 3 months (around 12/10/2022).     I discussed the assessment and treatment plan with the patient. The patient was provided an opportunity to ask questions and all were answered. The patient agreed with the plan and demonstrated an understanding of the instructions.   The patient was advised to call back or seek an in-person evaluation if the symptoms worsen or if the condition fails to improve as anticipated.  I provided 15 minutes of face-to-face time during this encounter including median intraservice time, reviewing previous notes, labs, imaging, medications and explaining diagnosis and management.  Claiborne Rigg, FNP-BC

## 2022-09-09 NOTE — Telephone Encounter (Signed)
Spoke with Beth with [facility] Number: [phone]  Requesting Skilled Nursing Frequency: 1 x wk for 1 wk and 2 xs a wk for 1 week and reevaluation  Order given

## 2022-09-12 DIAGNOSIS — T8781 Dehiscence of amputation stump: Secondary | ICD-10-CM | POA: Diagnosis not present

## 2022-09-12 DIAGNOSIS — L97419 Non-pressure chronic ulcer of right heel and midfoot with unspecified severity: Secondary | ICD-10-CM | POA: Diagnosis not present

## 2022-09-12 DIAGNOSIS — M86171 Other acute osteomyelitis, right ankle and foot: Secondary | ICD-10-CM | POA: Diagnosis not present

## 2022-09-12 DIAGNOSIS — B951 Streptococcus, group B, as the cause of diseases classified elsewhere: Secondary | ICD-10-CM | POA: Diagnosis not present

## 2022-09-12 DIAGNOSIS — E11621 Type 2 diabetes mellitus with foot ulcer: Secondary | ICD-10-CM | POA: Diagnosis not present

## 2022-09-12 DIAGNOSIS — T8149XA Infection following a procedure, other surgical site, initial encounter: Secondary | ICD-10-CM | POA: Diagnosis not present

## 2022-09-12 DIAGNOSIS — N179 Acute kidney failure, unspecified: Secondary | ICD-10-CM | POA: Diagnosis not present

## 2022-09-12 DIAGNOSIS — I959 Hypotension, unspecified: Secondary | ICD-10-CM | POA: Diagnosis not present

## 2022-09-12 DIAGNOSIS — E1169 Type 2 diabetes mellitus with other specified complication: Secondary | ICD-10-CM | POA: Diagnosis not present

## 2022-09-12 NOTE — Telephone Encounter (Signed)
Lm on secured VM with VO approval.

## 2022-09-13 ENCOUNTER — Telehealth: Payer: Self-pay | Admitting: *Deleted

## 2022-09-13 DIAGNOSIS — E11621 Type 2 diabetes mellitus with foot ulcer: Secondary | ICD-10-CM | POA: Diagnosis not present

## 2022-09-13 DIAGNOSIS — L97419 Non-pressure chronic ulcer of right heel and midfoot with unspecified severity: Secondary | ICD-10-CM | POA: Diagnosis not present

## 2022-09-13 DIAGNOSIS — I959 Hypotension, unspecified: Secondary | ICD-10-CM | POA: Diagnosis not present

## 2022-09-13 DIAGNOSIS — E1169 Type 2 diabetes mellitus with other specified complication: Secondary | ICD-10-CM | POA: Diagnosis not present

## 2022-09-13 DIAGNOSIS — N179 Acute kidney failure, unspecified: Secondary | ICD-10-CM | POA: Diagnosis not present

## 2022-09-13 DIAGNOSIS — T8149XA Infection following a procedure, other surgical site, initial encounter: Secondary | ICD-10-CM | POA: Diagnosis not present

## 2022-09-13 DIAGNOSIS — T8781 Dehiscence of amputation stump: Secondary | ICD-10-CM | POA: Diagnosis not present

## 2022-09-13 DIAGNOSIS — M86171 Other acute osteomyelitis, right ankle and foot: Secondary | ICD-10-CM | POA: Diagnosis not present

## 2022-09-13 DIAGNOSIS — B951 Streptococcus, group B, as the cause of diseases classified elsewhere: Secondary | ICD-10-CM | POA: Diagnosis not present

## 2022-09-13 NOTE — Telephone Encounter (Signed)
Patient requesting refill zinc and vitamin C. Patient informed that these are otc vitamins.

## 2022-09-15 ENCOUNTER — Telehealth: Payer: Self-pay

## 2022-09-15 DIAGNOSIS — N179 Acute kidney failure, unspecified: Secondary | ICD-10-CM | POA: Diagnosis not present

## 2022-09-15 DIAGNOSIS — L97419 Non-pressure chronic ulcer of right heel and midfoot with unspecified severity: Secondary | ICD-10-CM | POA: Diagnosis not present

## 2022-09-15 DIAGNOSIS — E11621 Type 2 diabetes mellitus with foot ulcer: Secondary | ICD-10-CM | POA: Diagnosis not present

## 2022-09-15 DIAGNOSIS — T8781 Dehiscence of amputation stump: Secondary | ICD-10-CM | POA: Diagnosis not present

## 2022-09-15 DIAGNOSIS — E1169 Type 2 diabetes mellitus with other specified complication: Secondary | ICD-10-CM | POA: Diagnosis not present

## 2022-09-15 DIAGNOSIS — T8149XA Infection following a procedure, other surgical site, initial encounter: Secondary | ICD-10-CM | POA: Diagnosis not present

## 2022-09-15 DIAGNOSIS — B951 Streptococcus, group B, as the cause of diseases classified elsewhere: Secondary | ICD-10-CM | POA: Diagnosis not present

## 2022-09-15 DIAGNOSIS — I959 Hypotension, unspecified: Secondary | ICD-10-CM | POA: Diagnosis not present

## 2022-09-15 DIAGNOSIS — M86171 Other acute osteomyelitis, right ankle and foot: Secondary | ICD-10-CM | POA: Diagnosis not present

## 2022-09-15 NOTE — Telephone Encounter (Signed)
Copied from CRM [id number]. Topic: Quick Communication - Home Health Verbal Orders >> Sep 15, 2022 11:32 AM Pincus Sanes wrote: Caller/Agency: [facility]netta [facility] [place]  Beth needs verbals, pt not seen in office as pt due to leg amputation/ Needs recertified plus VO Callback Number: [phone] Requesting Skilled Nursing Frequency: 1 wk for 5 wk and 1 time every 2 week for 2 weeks  Called spoke with Waynetta Sandy /[facility] skilled nursing order given for Frequency: 1 wk for 5 wk and 1 time every 2 week for 2 weeks

## 2022-09-20 ENCOUNTER — Ambulatory Visit (INDEPENDENT_AMBULATORY_CARE_PROVIDER_SITE_OTHER): Payer: Medicare HMO | Admitting: Family

## 2022-09-20 ENCOUNTER — Encounter: Payer: Self-pay | Admitting: Family

## 2022-09-20 DIAGNOSIS — R6889 Other general symptoms and signs: Secondary | ICD-10-CM | POA: Diagnosis not present

## 2022-09-20 DIAGNOSIS — D631 Anemia in chronic kidney disease: Secondary | ICD-10-CM | POA: Diagnosis not present

## 2022-09-20 DIAGNOSIS — Z89511 Acquired absence of right leg below knee: Secondary | ICD-10-CM

## 2022-09-20 DIAGNOSIS — I959 Hypotension, unspecified: Secondary | ICD-10-CM | POA: Diagnosis not present

## 2022-09-20 DIAGNOSIS — E1169 Type 2 diabetes mellitus with other specified complication: Secondary | ICD-10-CM | POA: Diagnosis not present

## 2022-09-20 DIAGNOSIS — I5042 Chronic combined systolic (congestive) and diastolic (congestive) heart failure: Secondary | ICD-10-CM | POA: Diagnosis not present

## 2022-09-20 DIAGNOSIS — I130 Hypertensive heart and chronic kidney disease with heart failure and stage 1 through stage 4 chronic kidney disease, or unspecified chronic kidney disease: Secondary | ICD-10-CM | POA: Diagnosis not present

## 2022-09-20 DIAGNOSIS — M86672 Other chronic osteomyelitis, left ankle and foot: Secondary | ICD-10-CM | POA: Diagnosis not present

## 2022-09-20 DIAGNOSIS — S88111A Complete traumatic amputation at level between knee and ankle, right lower leg, initial encounter: Secondary | ICD-10-CM

## 2022-09-20 DIAGNOSIS — M86271 Subacute osteomyelitis, right ankle and foot: Secondary | ICD-10-CM

## 2022-09-20 DIAGNOSIS — N1832 Chronic kidney disease, stage 3b: Secondary | ICD-10-CM | POA: Diagnosis not present

## 2022-09-20 DIAGNOSIS — E1122 Type 2 diabetes mellitus with diabetic chronic kidney disease: Secondary | ICD-10-CM | POA: Diagnosis not present

## 2022-09-20 DIAGNOSIS — T8781 Dehiscence of amputation stump: Secondary | ICD-10-CM | POA: Diagnosis not present

## 2022-09-20 NOTE — Progress Notes (Signed)
Post-Op Visit Note   Patient: Caleb [name]           Date of Birth: [birth year]           MRN: [id number] Visit Date: 09/20/2022 PCP: Hoy Register, MD  Chief Complaint:  Chief Complaint  Patient presents with   Right Leg - Routine Post Op    08/19/22 Right BKA    HPI:  HPI The patient is a 72 year old gentleman who is seen status post right below-knee amputation May 31 wearing his limb protector  Patient is a new right transtibial  amputee.  Patient's current comorbidities are not expected to impact the ability to function with the prescribed prosthesis. Patient verbally communicates a strong desire to use a prosthesis. Patient currently requires mobility aids to ambulate without a prosthesis.  Expects not to use mobility aids with a new prosthesis.  Patient is a K3 level ambulator that spends a lot of time walking around on uneven terrain over obstacles, up and down stairs, and ambulates with a variable cadence.    Ortho Exam On examination right residual limb this is healing well Staples harvested today.  2 central staples left in place there is scant serosanguineous drainage no dehiscenc  Visit Diagnoses: No diagnosis found.  Plan: Continue daily dose of cleansing dry dressings wear shrinker May call and proceed with prosthesis set up.  Follow-Up Instructions: No follow-ups on file.   Imaging: No results found.  Orders:  No orders of the defined types were placed in this encounter.  No orders of the defined types were placed in this encounter.    PMFS History: Patient Active Problem List   Diagnosis Date Noted   Unilateral complete BKA, right, initial encounter (HCC) 08/22/2022   Transaminitis 08/20/2022   Coronary artery disease involving native coronary artery of native heart with angina pectoris (HCC) 08/19/2022   Osteomyelitis of right lower extremity (HCC) 08/18/2022   Ganglion of right ankle and foot 08/12/2022   AKI (acute kidney injury) (HCC)  08/11/2022   Chronic osteomyelitis of left foot (HCC) 08/11/2022   Myoclonus 08/11/2022   Chronic osteomyelitis (HCC) 08/11/2022   Leukocytosis 08/03/2022   Anemia of chronic disease 08/03/2022   Heart failure with reduced ejection fraction (HCC) 08/03/2022   History of osteomyelitis 08/03/2022   Foot ulcer with necrosis of bone (HCC) 07/29/2022   Gastroenteritis 06/15/2022   Acute osteomyelitis of right calcaneus (HCC) 06/09/2022   Diabetic foot ulcer (HCC) 06/09/2022   SIRS (systemic inflammatory response syndrome) (HCC) 06/07/2022   Acute CHF (congestive heart failure) (HCC) 05/27/2022   Decubitus ulcer of right heel 05/27/2022   Nausea and vomiting 05/27/2022   Wild-type transthyretin-related (ATTR) amyloidosis (HCC) 04/26/2022   MGUS (monoclonal gammopathy of unknown significance) 02/15/2022   PAD (peripheral artery disease) (HCC) 01/11/2022   Non-ischemic cardiomyopathy (HCC) 01/11/2022   Stage IIIa chronic kidney disease 01/11/2022   Severe aortic stenosis 01/11/2022   S/P TAVR (transcatheter aortic valve replacement) 01/11/2022   Caries 11/12/2021   Teeth missing 11/12/2021   Retained tooth root 11/12/2021   Chronic apical periodontitis 11/12/2021   Chronic periodontitis 11/12/2021   Accretions on teeth 11/12/2021   Encounter for management of wound VAC 11/12/2021   Phobia of dental procedure 11/12/2021   Defective dental restoration 11/12/2021   Attrition, teeth excessive 11/12/2021   Torus mandibularis 11/12/2021   Diastema of teeth 11/12/2021   Encounter for preoperative dental examination 11/09/2021   Acute kidney injury superimposed on chronic kidney disease (HCC) 10/30/2021  Chronic systolic CHF (congestive heart failure) (HCC) 10/28/2021   Hypertensive urgency 10/28/2021   DM2 (diabetes mellitus, type 2) (HCC) 07/01/2021   Burn of first degree of multiple sites of unspecified wrist and hand, initial encounter 12/31/2020   Male erectile disorder (CODE)  12/18/2020   Encounter for sterilization 12/18/2020   Diabetic neuropathy (HCC) 09/11/2020   Low back pain, unspecified 09/11/2020   Unspecified kidney failure 09/11/2020   Unsteadiness on feet 09/11/2020   Status post amputation of lesser toe of left foot (HCC) 07/22/2020   Hypertensive heart disease with chronic combined systolic and diastolic congestive heart failure (HCC) 07/22/2020   Carotid stenosis, asymptomatic 02/03/2020   Peripheral vascular disease (HCC)    Carotid artery stenosis 12/16/2019   Moderate aortic stenosis 12/02/2019   Critical ischemia of foot (HCC) 11/18/2019   Carotid artery disease (HCC) 08/23/2019   Critical limb ischemia with history of revascularization of same extremity (HCC) 08/02/2019   Diabetes mellitus without complication (HCC)    Cardiac murmur 02/22/2018   Vitamin D deficiency 06/23/2015   Pancreatitis, acute 09/27/2012   Dyslipidemia 09/27/2012   Erectile dysfunction 09/27/2012   Essential hypertension 09/27/2012   Obesity, unspecified 08/14/2012   Chronic hyponatremia 08/14/2012   Acute pancreatitis 08/12/2012   HTN (hypertension) 08/12/2012   Hyperlipidemia 08/12/2012   Metabolic acidosis 08/12/2012   Past Medical History:  Diagnosis Date   Carotid artery occlusion    CHF (congestive heart failure) (HCC)    Diabetes mellitus without complication (HCC)    Type II   Dyslipidemia 09/27/2012   Erectile dysfunction 09/27/2012   GERD (gastroesophageal reflux disease)    Hypercholesteremia    Hyperlipidemia 08/12/2012   Hypertension    Hyponatremia 08/14/2012   MGUS (monoclonal gammopathy of unknown significance)    Neuropathy    Pancreatitis    Pancreatitis, acute 09/27/2012   Peripheral vascular disease (HCC)    S/P 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 Excell Seltzer & Bartle   Severe aortic stenosis    Vitamin D deficiency 06/23/2015    Family History   Problem Relation Age of Onset   CAD Father    Hypertension Father    Alcohol abuse Father        Cause of death   Diabetes Mother    Colon polyps Mother    CAD Brother 58       CABG   Colon cancer Neg Hx    Esophageal cancer Neg Hx    Rectal cancer Neg Hx    Stomach cancer Neg Hx     Past Surgical History:  Procedure Laterality Date   ABDOMINAL AORTOGRAM W/LOWER EXTREMITY N/A 08/08/2019   Procedure: ABDOMINAL AORTOGRAM W/LOWER EXTREMITY;  Surgeon: Runell Gess, MD;  [facility]: [facility] INVASIVE CV LAB;  Service: Cardiovascular;  Laterality: N/A;   ABDOMINAL AORTOGRAM W/LOWER EXTREMITY N/A 11/18/2019   Procedure: ABDOMINAL AORTOGRAM W/LOWER EXTREMITY;  Surgeon: Runell Gess, MD;  [facility]: [facility] INVASIVE CV LAB;  Service: Cardiovascular;  Laterality: N/A;   ABDOMINAL AORTOGRAM W/LOWER EXTREMITY N/A 06/13/2022   Procedure: ABDOMINAL AORTOGRAM W/LOWER EXTREMITY;  Surgeon: Chuck Hint, MD;  Location: [facility] INVASIVE CV LAB;  Service: Cardiovascular;  Laterality: N/A;   AMPUTATION Right 08/19/2022   Procedure: RIGHT BELOW KNEE AMPUTATION;  Surgeon: Nadara Mustard, MD;  Location: [facility] OR;  Service: Orthopedics;  Laterality: Right;   APPLICATION OF WOUND VAC  06/24/2022   Procedure: APPLICATION  OF WOUND VAC;  Surgeon: Nadara Mustard, MD;  Location: [facility] OR;  Service: Orthopedics;;   CARDIAC CATHETERIZATION     COLONOSCOPY  12 years ago   in [place] clinic= normal exam per pt   ENDARTERECTOMY FEMORAL Right 06/17/2022   Procedure: SUPERFICIAL ENDARTERECTOMY OF COMMON FEMORAL ARTERY;  Surgeon: Victorino Sparrow, MD;  Location: [facility] OR;  Service: Vascular;  Laterality: Right;   FEMORAL-TIBIAL BYPASS GRAFT Right 06/17/2022   Procedure: RIGHT FEMORAL- ANTERIOR TIBIAL ARTERY BYPASS;  Surgeon: Victorino Sparrow, MD;  Location: [facility] OR;  Service: Vascular;  Laterality: Right;   I & D EXTREMITY Right 06/24/2022   Procedure: RIGHT PARTIAL CALCANEUS EXCISION;  Surgeon: Nadara Mustard, MD;  Location: [facility] OR;   Service: Orthopedics;  Laterality: Right;   I & D EXTREMITY Right 07/29/2022   Procedure: RIGHT PARTIAL CALCANEUS EXCISION;  Surgeon: Nadara Mustard, MD;  Location: [facility] OR;  Service: Orthopedics;  Laterality: Right;   INTRAOPERATIVE TRANSTHORACIC ECHOCARDIOGRAM N/A 01/11/2022   Procedure: INTRAOPERATIVE TRANSTHORACIC ECHOCARDIOGRAM;  Surgeon: Tonny Bollman, MD;  Location: [facility] INVASIVE CV LAB;  Service: Open Heart Surgery;  Laterality: N/A;   KNEE SURGERY     MULTIPLE EXTRACTIONS WITH ALVEOLOPLASTY N/A 12/09/2021   Procedure: MULTIPLE EXTRACTION;  Surgeon: Sharman Cheek, DMD;  Location: [facility] OR;  Service: Dentistry;  Laterality: N/A;   PERIPHERAL VASCULAR INTERVENTION Right 08/08/2019   Procedure: PERIPHERAL VASCULAR INTERVENTION;  Surgeon: Runell Gess, MD;  [facility]: [facility] INVASIVE CV LAB;  Service: Cardiovascular;  Laterality: Right;   PERIPHERAL VASCULAR INTERVENTION Left 11/18/2019   Procedure: PERIPHERAL VASCULAR INTERVENTION;  Surgeon: Runell Gess, MD;  [facility]VASIVE CV LAB;  Service: Cardiovascular;  Laterality: Left;  left popliteal artery   RIGHT/LEFT HEART CATH AND CORONARY ANGIOGRAPHY N/A 10/29/2021   Procedure: RIGHT/LEFT HEART CATH AND CORONARY ANGIOGRAPHY;  Surgeon: Kathleene Hazel, MD;  Location: [facility] INVASIVE CV LAB;  Service: Cardiovascular;  Laterality: N/A;   TRANSCAROTID ARTERY REVASCULARIZATION  Left 12/16/2019   Procedure: TRANSCAROTID ARTERY REVASCULARIZATION;  Surgeon: Sherren Kerns, MD;  Location: [facility] OR;  Service: Vascular;  Laterality: Left;   TRANSCAROTID ARTERY REVASCULARIZATION  Right 02/03/2020   Procedure: RIGHT TRANSCAROTID ARTERY REVASCULARIZATION;  Surgeon: Sherren Kerns, MD;  Location: [facility] OR;  Service: Vascular;  Laterality: Right;   TRANSCATHETER AORTIC VALVE REPLACEMENT, TRANSFEMORAL N/A 01/11/2022   Procedure: Transcatheter Aortic Valve Replacement, Transfemoral;  Surgeon: Tonny Bollman, MD;  Location: [facility] INVASIVE CV LAB;   Service: Open Heart Surgery;  Laterality: N/A;   ULTRASOUND GUIDANCE FOR VASCULAR ACCESS Right 12/16/2019   Procedure: ULTRASOUND GUIDANCE FOR VASCULAR ACCESS;  Surgeon: Sherren Kerns, MD;  Location: [facility] OR;  Service: Vascular;  Laterality: Right;   ULTRASOUND GUIDANCE FOR VASCULAR ACCESS Right 02/03/2020   Procedure: ULTRASOUND GUIDANCE FOR VASCULAR ACCESS;  Surgeon: Sherren Kerns, MD;  Location: [facility] OR;  Service: Vascular;  Laterality: Right;   VASECTOMY     VEIN HARVEST Right 06/17/2022   Procedure: IPSILATERAL RIGHT GREATER SAPHENOUS VEIN HARVEST;  Surgeon: Victorino Sparrow, MD;  Location: [facility] OR;  Service: Vascular;  Laterality: Right;   Social History   Occupational History   Occupation: Caregiver    Comment: Special needs    Comment: retired  Tobacco Use   Smoking status: Never    Passive exposure: Never   Smokeless tobacco: Never  Vaping Use   Vaping Use: Never used  Substance and Sexual Activity   Alcohol use: Yes    Comment: occasional  Drug use: No   Sexual activity: Not Currently

## 2022-09-21 ENCOUNTER — Other Ambulatory Visit (HOSPITAL_BASED_OUTPATIENT_CLINIC_OR_DEPARTMENT_OTHER): Payer: Medicare HMO | Admitting: Pharmacist

## 2022-09-21 DIAGNOSIS — Z79899 Other long term (current) drug therapy: Secondary | ICD-10-CM

## 2022-09-21 MED ORDER — ISOSORBIDE MONONITRATE ER 30 MG PO TB24: 15.0000 mg | ORAL_TABLET | Freq: Every day | ORAL | 1 refills | Status: DC

## 2022-09-21 MED ORDER — CARVEDILOL 3.125 MG PO TABS: 3.1250 mg | ORAL_TABLET | Freq: Two times a day (BID) | ORAL | 1 refills | Status: AC

## 2022-09-21 MED ORDER — ATORVASTATIN CALCIUM 40 MG PO TABS: 40.0000 mg | ORAL_TABLET | Freq: Every morning | ORAL | 1 refills | Status: DC

## 2022-09-21 NOTE — Progress Notes (Signed)
  Pharmacy Quality Measure Review  This patient is appearing on report for being at risk of failing the adherence measure for Statin Therapy for Patients with Cardiovascular Disease [facility]) medications this calendar year. It looks like this occurred in part because he was seen three times in 2023. He reported adherence to his statin but there was not an active rx.   He is currently taking atorvastatin 40 mg daily.   Recent fill dates:  -08/15/2022, 30 day supply.  -09/11/2022, 30 day supply.   Rxn sent to patient's pharmacy for 90-day supplies with refills.   Butch Penny, PharmD, Patsy [place], CPP Clinical Pharmacist [facility] [phone]

## 2022-09-23 ENCOUNTER — Ambulatory Visit (HOSPITAL_COMMUNITY)
Admission: RE | Admit: 2022-09-23 | Discharge: 2022-09-23 | Disposition: A | Discharge status: Home / Self Care | Payer: No Typology Code available for payment source | Source: Ambulatory Visit | Attending: Vascular Surgery

## 2022-09-23 ENCOUNTER — Ambulatory Visit (INDEPENDENT_AMBULATORY_CARE_PROVIDER_SITE_OTHER): Payer: No Typology Code available for payment source | Admitting: Physician Assistant

## 2022-09-23 ENCOUNTER — Ambulatory Visit (INDEPENDENT_AMBULATORY_CARE_PROVIDER_SITE_OTHER)
Admission: RE | Admit: 2022-09-23 | Discharge: 2022-09-23 | Disposition: A | Discharge status: Home / Self Care | Payer: No Typology Code available for payment source | Source: Ambulatory Visit | Attending: Vascular Surgery

## 2022-09-23 ENCOUNTER — Encounter (HOSPITAL_COMMUNITY): Payer: Self-pay

## 2022-09-23 ENCOUNTER — Telehealth: Payer: Self-pay

## 2022-09-23 VITALS — BP 154/97 | HR 119 | Temp 97.8°F | Resp 16 | Ht 67.0 in | Wt 161.0 lb

## 2022-09-23 DIAGNOSIS — I70234 Atherosclerosis of native arteries of right leg with ulceration of heel and midfoot: Secondary | ICD-10-CM

## 2022-09-23 DIAGNOSIS — N1832 Chronic kidney disease, stage 3b: Secondary | ICD-10-CM | POA: Diagnosis not present

## 2022-09-23 DIAGNOSIS — I130 Hypertensive heart and chronic kidney disease with heart failure and stage 1 through stage 4 chronic kidney disease, or unspecified chronic kidney disease: Secondary | ICD-10-CM | POA: Diagnosis not present

## 2022-09-23 DIAGNOSIS — I5042 Chronic combined systolic (congestive) and diastolic (congestive) heart failure: Secondary | ICD-10-CM | POA: Diagnosis not present

## 2022-09-23 DIAGNOSIS — T8781 Dehiscence of amputation stump: Secondary | ICD-10-CM | POA: Diagnosis not present

## 2022-09-23 DIAGNOSIS — E1122 Type 2 diabetes mellitus with diabetic chronic kidney disease: Secondary | ICD-10-CM | POA: Diagnosis not present

## 2022-09-23 DIAGNOSIS — E1169 Type 2 diabetes mellitus with other specified complication: Secondary | ICD-10-CM | POA: Diagnosis not present

## 2022-09-23 DIAGNOSIS — D631 Anemia in chronic kidney disease: Secondary | ICD-10-CM | POA: Diagnosis not present

## 2022-09-23 DIAGNOSIS — M86672 Other chronic osteomyelitis, left ankle and foot: Secondary | ICD-10-CM | POA: Diagnosis not present

## 2022-09-23 DIAGNOSIS — I959 Hypotension, unspecified: Secondary | ICD-10-CM | POA: Diagnosis not present

## 2022-09-23 DIAGNOSIS — R6889 Other general symptoms and signs: Secondary | ICD-10-CM | POA: Diagnosis not present

## 2022-09-23 LAB — VAS US ABI WITH/WO TBI: Left ABI: 0.79

## 2022-09-23 NOTE — Progress Notes (Signed)
[facility] [place] HISTORY AND PHYSICAL   History of Present Illness:  Patient is a 72 y.o. year old male who presents for evaluation of PAD.  He was seen at [facility] on 06/13/22 with a history of angiography and revascularization in the past by Dr. Allyson Sabal.  He was taken for angiography by DR. Dickson on 06/13/22 and then to the OR by Dr. Karin Lieu for right fem-AT vein bypass.    Dr. Lajoyce Corners was managing his heel wound with serial visit to the OR for debridement.  Ultimately his   Osteomyelitis Right Heel who failed conservative treatment and he had a right BKA by Dr. Lajoyce Corners on 08/19/22.  He had a f/u appoint for the left LE.  He has know PAD with histry of toe amputation on the left 5th toe.  He is here today for surveillance of the left LE.  He denies ambulation/transfer claudication, no rest pain and no open wounds.     He has a history of left TCAR on 12/16/2019 by Dr. Darrick Penna and subsequent right TCAR on 02/03/2020 by Dr. Darrick Penna. This was done to treat asymptomatic, high-grade lesions that required treatment due to high risk aortic stenosis with decreased EF.   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 Excell Seltzer & Bartle   Severe aortic stenosis    Vitamin D deficiency 06/23/2015    Past Surgical History:  Procedure Laterality Date   ABDOMINAL AORTOGRAM W/LOWER EXTREMITY N/A 08/08/2019    Procedure: ABDOMINAL AORTOGRAM W/LOWER EXTREMITY;  Surgeon: Runell Gess, MD;  [facility]: [facility] INVASIVE CV LAB;  Service: Cardiovascular;  Laterality: N/A;   ABDOMINAL AORTOGRAM W/LOWER EXTREMITY N/A 11/18/2019   Procedure: ABDOMINAL AORTOGRAM W/LOWER EXTREMITY;  Surgeon: Runell Gess, MD;  Location: [facility] LAB;  Service: Cardiovascular;  Laterality: N/A;   ABDOMINAL AORTOGRAM W/LOWER EXTREMITY N/A 06/13/2022   Procedure: ABDOMINAL AORTOGRAM W/LOWER EXTREMITY;  Surgeon: Chuck Hint, MD;  Location: [facility] INVASIVE CV LAB;  Service: Cardiovascular;  Laterality: N/A;   AMPUTATION Right 08/19/2022   Procedure: RIGHT BELOW KNEE AMPUTATION;  Surgeon: Nadara Mustard, MD;  Location: [facility] OR;  Service: Orthopedics;  Laterality: Right;   APPLICATION OF WOUND VAC  06/24/2022   Procedure: APPLICATION OF WOUND VAC;  Surgeon: Nadara Mustard, MD;  Location: [facility] OR;  Service: Orthopedics;;   CARDIAC CATHETERIZATION     COLONOSCOPY  12 years ago   in [place] clinic= normal exam per pt   ENDARTERECTOMY FEMORAL Right 06/17/2022   Procedure: SUPERFICIAL ENDARTERECTOMY OF COMMON FEMORAL ARTERY;  Surgeon: Victorino Sparrow, MD;  Location: [facility] OR;  Service: Vascular;  Laterality: Right;   FEMORAL-TIBIAL BYPASS GRAFT Right 06/17/2022   Procedure: RIGHT FEMORAL- ANTERIOR TIBIAL ARTERY BYPASS;  Surgeon: Victorino Sparrow, MD;  Location: [facility] OR;  Service: Vascular;  Laterality: Right;   I & D  EXTREMITY Right 06/24/2022   Procedure: RIGHT PARTIAL CALCANEUS EXCISION;  Surgeon: Nadara Mustard, MD;  Location: [facility] OR;  Service: Orthopedics;  Laterality: Right;   I & D EXTREMITY Right 07/29/2022   Procedure: RIGHT PARTIAL CALCANEUS EXCISION;  Surgeon: Nadara Mustard, MD;  Location: [facility] OR;  Service: Orthopedics;  Laterality: Right;   INTRAOPERATIVE TRANSTHORACIC ECHOCARDIOGRAM N/A 01/11/2022   Procedure: INTRAOPERATIVE TRANSTHORACIC ECHOCARDIOGRAM;  Surgeon: Tonny Bollman, MD;  Location: [facility] INVASIVE CV LAB;  Service: Open  Heart Surgery;  Laterality: N/A;   KNEE SURGERY     MULTIPLE EXTRACTIONS WITH ALVEOLOPLASTY N/A 12/09/2021   Procedure: MULTIPLE EXTRACTION;  Surgeon: Sharman Cheek, DMD;  Location: [facility] OR;  Service: Dentistry;  Laterality: N/A;   PERIPHERAL VASCULAR INTERVENTION Right 08/08/2019   Procedure: PERIPHERAL VASCULAR INTERVENTION;  Surgeon: Runell Gess, MD;  [facility]: [facility];  Service: Cardiovascular;  Laterality: Right;   PERIPHERAL VASCULAR INTERVENTION Left 11/18/2019   Procedure: PERIPHERAL VASCULAR INTERVENTION;  Surgeon: Runell Gess, MD;  [facility]: [facility];  Service: Cardiovascular;  Laterality: Left;  left popliteal artery   RIGHT/LEFT HEART CATH AND CORONARY ANGIOGRAPHY N/A 10/29/2021   Procedure: RIGHT/LEFT HEART CATH AND CORONARY ANGIOGRAPHY;  Surgeon: Kathleene Hazel, MD;  [facility]: [facility];  Service: Cardiovascular;  Laterality: N/A;   TRANSCAROTID ARTERY REVASCULARIZATION  Left 12/16/2019   Procedure: TRANSCAROTID ARTERY REVASCULARIZATION;  Surgeon: Sherren Kerns, MD;  Location: [facility] OR;  Service: Vascular;  Laterality: Left;   TRANSCAROTID ARTERY REVASCULARIZATION  Right 02/03/2020   Procedure: RIGHT TRANSCAROTID ARTERY REVASCULARIZATION;  Surgeon: Sherren Kerns, MD;  Location: [facility] OR;  Service: Vascular;  Laterality: Right;   TRANSCATHETER AORTIC VALVE REPLACEMENT, TRANSFEMORAL N/A 01/11/2022   Procedure: Transcatheter Aortic Valve Replacement, Transfemoral;  Surgeon: Tonny Bollman, MD;  Location: [facility] INVASIVE CV LAB;  Service: Open Heart Surgery;  Laterality: N/A;   ULTRASOUND GUIDANCE FOR VASCULAR ACCESS Right 12/16/2019   Procedure: ULTRASOUND GUIDANCE FOR VASCULAR ACCESS;  Surgeon: Sherren Kerns, MD;  Location: [facility] OR;  Service: Vascular;  Laterality: Right;   ULTRASOUND GUIDANCE FOR VASCULAR ACCESS Right 02/03/2020   Procedure: ULTRASOUND GUIDANCE FOR VASCULAR ACCESS;  Surgeon: Sherren Kerns, MD;  Location: [facility]  OR;  Service: Vascular;  Laterality: Right;   VASECTOMY     VEIN HARVEST Right 06/17/2022   Procedure: IPSILATERAL RIGHT GREATER SAPHENOUS VEIN HARVEST;  Surgeon: Victorino Sparrow, MD;  Location: [facility] OR;  Service: Vascular;  Laterality: Right;    ROS:   General:  No weight loss, Fever, chills  HEENT: No recent headaches, no nasal bleeding, no visual changes, no sore throat  Neurologic: No dizziness, blackouts, seizures. No recent symptoms of stroke or mini- stroke. No recent episodes of slurred speech, or temporary blindness.  Cardiac: No recent episodes of chest pain/pressure, no shortness of breath at rest.  No shortness of breath with exertion.  Denies history of atrial fibrillation or irregular heartbeat  Vascular: No history of rest pain in feet.  No history of claudication.  No history of non-healing ulcer, No history of DVT   Pulmonary: No home oxygen, no productive cough, no hemoptysis,  No asthma or wheezing  Musculoskeletal:  [ ]  Arthritis, [ ]  Low back pain,  [ ]  Joint pain  Hematologic:No history of hypercoagulable state.  No history of easy bleeding.  No history of anemia  Gastrointestinal: No hematochezia or melena,  No gastroesophageal reflux, no trouble swallowing  Urinary: [ ]  chronic Kidney  disease, [ ]  on HD - [ ]  MWF or [ ]  TTHS, [ ]  Burning with urination, [ ]  Frequent urination, [ ]  Difficulty urinating;   Skin: No rashes  Psychological: No history of anxiety,  No history of depression  Social History Social History   Tobacco Use   Smoking status: Never    Passive exposure: Never   Smokeless tobacco: Never  Vaping Use   Vaping Use: Never used  Substance Use Topics   Alcohol use: Yes    Comment: occasional   Drug use: No    Family History Family History  Problem Relation Age of Onset   CAD Father    Hypertension Father    Alcohol abuse Father        Cause of death   Diabetes Mother    Colon polyps Mother    CAD Brother 31       CABG   Colon  cancer Neg Hx    Esophageal cancer Neg Hx    Rectal cancer Neg Hx    Stomach cancer Neg Hx     Allergies  Allergies  Allergen Reactions   Codeine Nausea And Vomiting   Percocet [Oxycodone-Acetaminophen] Nausea And Vomiting   Tramadol Hcl Nausea And Vomiting     Current Outpatient Medications  Medication Sig Dispense Refill   acetaminophen (TYLENOL) 325 MG tablet Take 1-2 tablets (325-650 mg total) by mouth every 4 (four) hours as needed for mild pain.     apixaban (ELIQUIS) 5 MG TABS tablet Take 1 tablet (5 mg total) by mouth 2 (two) times daily. 180 tablet 1   ascorbic acid (VITAMIN C) 1000 MG tablet Take 1 tablet (1,000 mg total) by mouth daily.     aspirin EC 81 MG tablet Take 1 tablet (81 mg total) by mouth in the morning. 30 tablet 0   atorvastatin (LIPITOR) 40 MG tablet Take 1 tablet (40 mg total) by mouth in the morning. 90 tablet 1   carvedilol (COREG) 3.125 MG tablet Take 1 tablet (3.125 mg total) by mouth 2 (two) times daily with a meal. 180 tablet 1   Cholecalciferol (VITAMIN D-3) 25 MCG (1000 UT) CAPS Take 1,000 Units by mouth daily.     Continuous Blood Gluc Sensor (FREESTYLE LIBRE 14 DAY SENSOR) MISC Inject 1 Device into the skin every 14 (fourteen) days.     docusate sodium (COLACE) 100 MG capsule Take 1 capsule (100 mg total) by mouth daily. 30 capsule 0   gabapentin (NEURONTIN) 300 MG capsule Take 1 capsule (300 mg total) by mouth 2 (two) times daily AND 2 capsules (600 mg total) at bedtime. 120 capsule 0   insulin NPH-regular Human (70-30) 100 UNIT/ML injection Inject 5 Units into the skin 2 (two) times daily. With breakfast and with dinner 10 mL 11   isosorbide mononitrate (IMDUR) 30 MG 24 hr tablet Take 0.5 tablets (15 mg total) by mouth daily. 45 tablet 1   melatonin 3 MG TABS tablet Take 1 tablet (3 mg total) by mouth at bedtime as needed (insomnia). 30 tablet 0   polyethylene glycol (MIRALAX / GLYCOLAX) 17 g packet Take 17 g by mouth daily. 14 each 0   vitamin  B-12 (CYANOCOBALAMIN) 500 MCG tablet Take 500 mcg by mouth in the morning.     bisacodyl (DULCOLAX) 5 MG EC tablet Take 1 tablet (5 mg total) by mouth daily as needed for moderate constipation. (Patient not taking: Reported on 09/23/2022) 30 tablet 0   HYDROcodone-acetaminophen (NORCO/VICODIN) 5-325  MG tablet Take 1 tablet by mouth every 6 (six) hours as needed for moderate pain. (Patient not taking: Reported on 09/23/2022) 28 tablet 0   PRESCRIPTION MEDICATION Inject 0.1-1 mLs as directed See admin instructions. Tri-Mix Standard Strength 5 ml. Formula: Prostaglandin 10 mcg/ml, Papaverine 30 mg/ml, Phentolamine 1 mg/ml. Inject 0.1 ml into side of penis as directed as needed (to be injected immediately before sexual intercourse) may increase the dose by 0.1 ml every 48 hours to achieve an erection. Max dose 1 ml. (Patient not taking: Reported on 08/18/2022)     triamcinolone cream (KENALOG) 0.1 % Apply topically 2 (two) times daily. (Patient not taking: Reported on 09/23/2022) 30 g 0   zinc sulfate 220 (50 Zn) MG capsule Take 1 capsule (220 mg total) by mouth daily. (Patient not taking: Reported on 09/23/2022)     No current facility-administered medications for this visit.    Physical Examination  Vitals:   09/23/22 1356  BP: (!) 154/97  Pulse: (!) 119  Resp: 16  Temp: 97.8 F (36.6 C)  TempSrc: Temporal  SpO2: 96%  Weight: 161 lb (73 kg)  Height: 5\' 7"  (1.702 m)    Body mass index is 25.22 kg/m.  General:  Alert and oriented, no acute distress HEENT: Normal Neck: No bruit or JVD Pulmonary: Clear to auscultation bilaterally Cardiac: Regular Rate and Rhythm without murmur Abdomen: Soft, non-tender, non-distended, no mass, no scars Skin: No rash Extremity Pulses:  radial, right BKA, left foot edema     Musculoskeletal: left foot and ankle edema 0.5 cm black " blood blister" on the later left foot.  No open wounds   Neurologic: Upper and lower extremity motor 5/5 and symmetric  DATA:   ABI Findings:  +--------+------------------+-----+--------+--------+  Right  Rt Pressure (mmHg)IndexWaveformComment   +--------+------------------+-----+--------+--------+  [id number]                                     +--------+------------------+-----+--------+--------+  PTA                                   BKA       +--------+------------------+-----+--------+--------+  DP                                    BKA       +--------+------------------+-----+--------+--------+   +---------+------------------+-----+-------------------+-------+  Left    Lt Pressure (mmHg)IndexWaveform           Comment  +---------+------------------+-----+-------------------+-------+  Brachial 135                                                +---------+------------------+-----+-------------------+-------+  PTA     102               0.76 dampened monophasic         +---------+------------------+-----+-------------------+-------+  DP      107               0.79 monophasic                  +---------+------------------+-----+-------------------+-------+  Skipper Cliche  0.61                             +---------+------------------+-----+-------------------+-------+   +-------+-----------+-----------+-----------------------+------------+  ABI/TBIToday's ABIToday's TBIPrevious ABI           Previous TBI  +-------+-----------+-----------+-----------------------+------------+  Right BKA        BKA        0.70                   absent        +-------+-----------+-----------+-----------------------+------------+  Left  0.79       0.61       0.94 noted as calcified0.44          +-------+-----------+-----------+-----------------------+------------+     Summary:  Right: BKA.   Left: Resting left ankle-brachial index indicates moderate left lower  extremity arterial disease. The left toe-brachial index is abnormal.  Waveforms  appear unchanged.          ASSESSMENT/PLAN:  PAD now with right BKA by Dr. Lajoyce Corners after failed intervention and progressive osteomyelitis of the calcaneous.  He has had previous surgery on the left foot .   The ABI shows 0.79 index of the left LE.  This is unchanged.  He does have a 0.5 cm black spot on the left lateral foot we will watch.    He has an appoint ment with DR. Lajoyce Corners in a few weeks and we will see him again in about 3-4 weeks to check his skin.  Activity as tolerates and periodically elevation of the left LE to help with the edema.     He has a history of left TCAR on 12/16/2019 by Dr. Darrick Penna and subsequent right TCAR on 02/03/2020 by Dr. Darrick Penna. This was done to treat asymptomatic, high-grade lesions that required treatment due to high risk aortic stenosis with decreased EF. He want s to f/u with DR. Karin Lieu to discuss plans for TCAR revision.  He has in stent stenosis with 149 EDV.  He states Dr. Karin Lieu wanted him to recover from the LE surgeries prior to discussing the Carotid stenosis.       Mosetta Pigeon PA-C [facility] [place] Office: [phone]  MD on call Randie Heinz

## 2022-09-23 NOTE — Telephone Encounter (Signed)
Pt was called and he states that he will have caregiver to drop form of for completion.

## 2022-09-23 NOTE — Telephone Encounter (Signed)
Copied from CRM [id number]. Topic: General - Other >> Sep 20, 2022  8:52 AM Turkey B wrote: Reason for CRM: pt called in trying to arrange for his caretaker , Mindi Junker to fill out paperwork on her end, because he has already filled out his I norder for him to ride the scat bus. Her phone number is  [phone]

## 2022-09-26 ENCOUNTER — Ambulatory Visit: Payer: Medicare HMO | Attending: Family Medicine

## 2022-09-26 DIAGNOSIS — D631 Anemia in chronic kidney disease: Secondary | ICD-10-CM | POA: Diagnosis not present

## 2022-09-26 DIAGNOSIS — M86672 Other chronic osteomyelitis, left ankle and foot: Secondary | ICD-10-CM | POA: Diagnosis not present

## 2022-09-26 DIAGNOSIS — I959 Hypotension, unspecified: Secondary | ICD-10-CM | POA: Diagnosis not present

## 2022-09-26 DIAGNOSIS — D649 Anemia, unspecified: Secondary | ICD-10-CM

## 2022-09-26 DIAGNOSIS — N179 Acute kidney failure, unspecified: Secondary | ICD-10-CM

## 2022-09-26 DIAGNOSIS — I110 Hypertensive heart disease with heart failure: Secondary | ICD-10-CM | POA: Diagnosis not present

## 2022-09-26 DIAGNOSIS — E1122 Type 2 diabetes mellitus with diabetic chronic kidney disease: Secondary | ICD-10-CM | POA: Diagnosis not present

## 2022-09-26 DIAGNOSIS — N1832 Chronic kidney disease, stage 3b: Secondary | ICD-10-CM | POA: Diagnosis not present

## 2022-09-26 DIAGNOSIS — T8781 Dehiscence of amputation stump: Secondary | ICD-10-CM | POA: Diagnosis not present

## 2022-09-26 DIAGNOSIS — I5042 Chronic combined systolic (congestive) and diastolic (congestive) heart failure: Secondary | ICD-10-CM | POA: Diagnosis not present

## 2022-09-26 DIAGNOSIS — I130 Hypertensive heart and chronic kidney disease with heart failure and stage 1 through stage 4 chronic kidney disease, or unspecified chronic kidney disease: Secondary | ICD-10-CM | POA: Diagnosis not present

## 2022-09-26 DIAGNOSIS — R6889 Other general symptoms and signs: Secondary | ICD-10-CM | POA: Diagnosis not present

## 2022-09-26 DIAGNOSIS — E1169 Type 2 diabetes mellitus with other specified complication: Secondary | ICD-10-CM | POA: Diagnosis not present

## 2022-09-27 ENCOUNTER — Other Ambulatory Visit: Payer: Self-pay | Admitting: Physician Assistant

## 2022-09-27 DIAGNOSIS — D631 Anemia in chronic kidney disease: Secondary | ICD-10-CM | POA: Diagnosis not present

## 2022-09-27 DIAGNOSIS — I130 Hypertensive heart and chronic kidney disease with heart failure and stage 1 through stage 4 chronic kidney disease, or unspecified chronic kidney disease: Secondary | ICD-10-CM | POA: Diagnosis not present

## 2022-09-27 DIAGNOSIS — E1122 Type 2 diabetes mellitus with diabetic chronic kidney disease: Secondary | ICD-10-CM | POA: Diagnosis not present

## 2022-09-27 DIAGNOSIS — I959 Hypotension, unspecified: Secondary | ICD-10-CM | POA: Diagnosis not present

## 2022-09-27 DIAGNOSIS — I5042 Chronic combined systolic (congestive) and diastolic (congestive) heart failure: Secondary | ICD-10-CM | POA: Diagnosis not present

## 2022-09-27 DIAGNOSIS — N1832 Chronic kidney disease, stage 3b: Secondary | ICD-10-CM | POA: Diagnosis not present

## 2022-09-27 DIAGNOSIS — T8781 Dehiscence of amputation stump: Secondary | ICD-10-CM | POA: Diagnosis not present

## 2022-09-27 DIAGNOSIS — E1169 Type 2 diabetes mellitus with other specified complication: Secondary | ICD-10-CM | POA: Diagnosis not present

## 2022-09-27 DIAGNOSIS — M86672 Other chronic osteomyelitis, left ankle and foot: Secondary | ICD-10-CM | POA: Diagnosis not present

## 2022-09-27 LAB — CBC WITH DIFFERENTIAL/PLATELET
Basophils Absolute: 0 10*3/uL (ref 0.0–0.2)
Basos: 1 %
EOS (ABSOLUTE): 0.3 10*3/uL (ref 0.0–0.4)
Eos: 7 %
Hematocrit: 37.1 % — ABNORMAL LOW (ref 37.5–51.0)
Hemoglobin: 11.9 g/dL — ABNORMAL LOW (ref 13.0–17.7)
Immature Grans (Abs): 0 10*3/uL (ref 0.0–0.1)
Immature Granulocytes: 0 %
Lymphocytes Absolute: 1.6 10*3/uL (ref 0.7–3.1)
Lymphs: 39 %
MCH: 28.2 pg (ref 26.6–33.0)
MCHC: 32.1 g/dL (ref 31.5–35.7)
MCV: 88 fL (ref 79–97)
Monocytes Absolute: 0.5 10*3/uL (ref 0.1–0.9)
Monocytes: 11 %
Neutrophils Absolute: 1.7 10*3/uL (ref 1.4–7.0)
Neutrophils: 42 %
Platelets: 196 10*3/uL (ref 150–450)
RBC: 4.22 x10E6/uL (ref 4.14–5.80)
RDW: 17.3 % — ABNORMAL HIGH (ref 11.6–15.4)
WBC: 4 10*3/uL (ref 3.4–10.8)

## 2022-09-27 LAB — CMP14+EGFR
ALT: 10 IU/L (ref 0–44)
AST: 18 IU/L (ref 0–40)
Albumin: 3.6 g/dL — ABNORMAL LOW (ref 3.8–4.8)
Alkaline Phosphatase: 139 IU/L — ABNORMAL HIGH (ref 44–121)
BUN/Creatinine Ratio: 18 (ref 10–24)
BUN: 23 mg/dL (ref 8–27)
Bilirubin Total: 0.3 mg/dL (ref 0.0–1.2)
CO2: 20 mmol/L (ref 20–29)
Calcium: 9 mg/dL (ref 8.6–10.2)
Chloride: 105 mmol/L (ref 96–106)
Creatinine, Ser: 1.25 mg/dL (ref 0.76–1.27)
Globulin, Total: 3.5 g/dL (ref 1.5–4.5)
Glucose: 102 mg/dL — ABNORMAL HIGH (ref 70–99)
Potassium: 4.6 mmol/L (ref 3.5–5.2)
Sodium: 139 mmol/L (ref 134–144)
Total Protein: 7.1 g/dL (ref 6.0–8.5)
eGFR: 61 mL/min/{1.73_m2} (ref >59)

## 2022-09-28 DIAGNOSIS — D631 Anemia in chronic kidney disease: Secondary | ICD-10-CM | POA: Diagnosis not present

## 2022-09-28 DIAGNOSIS — I959 Hypotension, unspecified: Secondary | ICD-10-CM | POA: Diagnosis not present

## 2022-09-28 DIAGNOSIS — N1832 Chronic kidney disease, stage 3b: Secondary | ICD-10-CM | POA: Diagnosis not present

## 2022-09-28 DIAGNOSIS — T8781 Dehiscence of amputation stump: Secondary | ICD-10-CM | POA: Diagnosis not present

## 2022-09-28 DIAGNOSIS — E1122 Type 2 diabetes mellitus with diabetic chronic kidney disease: Secondary | ICD-10-CM | POA: Diagnosis not present

## 2022-09-28 DIAGNOSIS — I130 Hypertensive heart and chronic kidney disease with heart failure and stage 1 through stage 4 chronic kidney disease, or unspecified chronic kidney disease: Secondary | ICD-10-CM | POA: Diagnosis not present

## 2022-09-28 DIAGNOSIS — E1169 Type 2 diabetes mellitus with other specified complication: Secondary | ICD-10-CM | POA: Diagnosis not present

## 2022-09-28 DIAGNOSIS — I5042 Chronic combined systolic (congestive) and diastolic (congestive) heart failure: Secondary | ICD-10-CM | POA: Diagnosis not present

## 2022-09-28 DIAGNOSIS — M86672 Other chronic osteomyelitis, left ankle and foot: Secondary | ICD-10-CM | POA: Diagnosis not present

## 2022-09-28 NOTE — Telephone Encounter (Signed)
Patient has called to check the status of the [employer] TRANSPORTATION form that he stated Michail Jewels had dropped off about a week ago and he has not had an update yet, he stated he does not want to miss any appointments. Patient stated can call him with an update but Marlowe Shores will have to pick up the form on her lunch hour and to call her when it is ready.   Patient's contact #: [phone] Marlowe Shores contact #: [phone]

## 2022-09-28 NOTE — Telephone Encounter (Signed)
Form has been received and case manager will reach out to patient for completion.

## 2022-09-28 NOTE — Telephone Encounter (Signed)
Pt is calling back regarding the form for [employer]. I did explain to the pt that the form has been received and the case manager will reach out to the pt for completion. Pt just want to see if it can be done by tomorrow so Marlowe Shores can pick it up on her lunch break and turn it into [employer] on Friday so that the pt will be able to have a ride to his appts next week.

## 2022-09-29 NOTE — Telephone Encounter (Signed)
Case manager will contact  patient once form is completed and faxed to [employer].

## 2022-09-30 ENCOUNTER — Telehealth: Payer: Self-pay | Admitting: Family Medicine

## 2022-09-30 ENCOUNTER — Ambulatory Visit: Payer: Self-pay

## 2022-09-30 NOTE — Telephone Encounter (Signed)
Call placed to patient unable to reach message left on VM. To advise that we doing have has [employer] paperwork and CM is working on it [employer] him when it complete and medication request has been sent to PCP for review.

## 2022-09-30 NOTE — Telephone Encounter (Signed)
Medication Refill - Medication: furosemide   Stated it helps his breathing.   Has the patient contacted their pharmacy? No. (Agent: If no, request that the patient contact the pharmacy for the refill. If patient does not wish to contact the pharmacy document the reason why and proceed with request.)   Preferred Pharmacy (with phone number or street name):  [id number] [place], [place] - [address]  [address] [place] [address]  Phone: [phone] Fax: [phone]  Hours: Not open 24 hours   Has the patient been seen for an appointment in the last year OR does the patient have an upcoming appointment? No.  Agent: Please be advised that RX refills may take up to 3 business days. We ask that you follow-up with your pharmacy.

## 2022-09-30 NOTE — Telephone Encounter (Signed)
Chief Complaint: SOB Symptoms: Pt reports 1 episode of SOB very briefly  Frequency: 1 episode last night  Pertinent Negatives: Patient denies Chest pain, continued SOB, sleep difficulties and other symptoms Disposition: [] ED /[] Urgent Care (no appt availability in office) / [x] Appointment(In office/virtual)/ []  [facility]/ [] Home Care/ [] Refused Recommended Disposition /[] [name]/ []  Follow-up with PCP Additional Notes: Patient stated that yesterday his daughters told him that he sound like he was having a difficult time breathing over the phone. Patient stated he felt fine and had his O2 sat check twice yesterday and it was 98%. However when it was time for bed he stated he noticed that he had to a slight pause in his breathing. Patient denies difficult breathing at this time. Patient stated he did not have this problem when he was on furosemide. Patient requested a refill for his Furosemide Rx. Patient also asked if his transportation paperwork has been completed to ride [employer] to his appointments. Advised patient that he will need to be evaluated. Patient agreeable. Patient scheduled next available 11/16/22. Advised I would forward message to provider for the Furosemide request and paperwork.    Reason for Disposition  [1] MILD longstanding difficulty breathing AND [2]  SAME as normal  Answer Assessment - Initial Assessment Questions 1. RESPIRATORY STATUS: "Describe your breathing?" (e.g., wheezing, shortness of breath, unable to speak, severe coughing)      SOB for a brief moment  2. ONSET: "When did this breathing problem begin?"      Last night  3. PATTERN "Does the difficult breathing come and go, or has it been constant since it started?"      Comes and goes. It was the first time I noticed it. 4. SEVERITY: "How bad is your breathing?" (e.g., mild, moderate, severe)    - MILD: No SOB at rest, mild SOB with walking, speaks normally in sentences, can lie down, no  retractions, pulse < 100.    - MODERATE: SOB at rest, SOB with minimal exertion and prefers to sit, cannot lie down flat, speaks in phrases, mild retractions, audible wheezing, pulse 100-120.    - SEVERE: Very SOB at rest, speaks in single words, struggling to breathe, sitting hunched forward, retractions, pulse > 120      Mild it only happened once.  5. RECURRENT SYMPTOM: "Have you had difficulty breathing before?" If Yes, ask: "When was the last time?" and "What happened that time?"      No 6. CARDIAC HISTORY: "Do you have any history of heart disease?" (e.g., heart attack, angina, bypass surgery, angioplasty)      Yes, I had a stint or something place a few months ago. 7. LUNG HISTORY: "Do you have any history of lung disease?"  (e.g., pulmonary embolus, asthma, emphysema)     No 8. CAUSE: "What do you think is causing the breathing problem?"      I recently stopped taking lasix 9. OTHER SYMPTOMS: "Do you have any other symptoms? (e.g., dizziness, runny nose, cough, chest pain, fever)     No 10. O2 SATURATION MONITOR:  "Do you use an oxygen saturation monitor (pulse oximeter) at home?" If Yes, ask: "What is your reading (oxygen level) today?" "What is your usual oxygen saturation reading?" (e.g., 95%)       No  Protocols used: Breathing Difficulty-A-AH

## 2022-10-03 ENCOUNTER — Encounter: Payer: Medicare HMO | Attending: Physical Medicine and Rehabilitation | Admitting: Physical Medicine and Rehabilitation

## 2022-10-03 ENCOUNTER — Encounter: Payer: Self-pay | Admitting: Physical Medicine and Rehabilitation

## 2022-10-03 VITALS — BP 136/86 | HR 82 | Ht 67.0 in | Wt 161.0 lb

## 2022-10-03 DIAGNOSIS — Z89511 Acquired absence of right leg below knee: Secondary | ICD-10-CM | POA: Diagnosis not present

## 2022-10-03 DIAGNOSIS — Z993 Dependence on wheelchair: Secondary | ICD-10-CM | POA: Insufficient documentation

## 2022-10-03 DIAGNOSIS — I70229 Atherosclerosis of native arteries of extremities with rest pain, unspecified extremity: Secondary | ICD-10-CM | POA: Diagnosis not present

## 2022-10-03 DIAGNOSIS — Z9889 Other specified postprocedural states: Secondary | ICD-10-CM | POA: Diagnosis not present

## 2022-10-03 DIAGNOSIS — R6889 Other general symptoms and signs: Secondary | ICD-10-CM | POA: Diagnosis not present

## 2022-10-03 MED ORDER — FUROSEMIDE 40 MG PO TABS: 40.0000 mg | ORAL_TABLET | Freq: Every day | ORAL | 1 refills | Status: AC

## 2022-10-03 NOTE — Telephone Encounter (Signed)
Spoke with patient . Verified name & DOB  Patient aware that we are working on his form and will be giving him a call back as soon as possible.

## 2022-10-03 NOTE — Telephone Encounter (Signed)
Please verify his Lasix dose as it looks like his Lasix was discontinued at discharge and I will send a refill in for him.

## 2022-10-03 NOTE — Telephone Encounter (Addendum)
Spoke with patient . Verified name & DOB  Patient Voiced that he takes furosemide  80 mg in the am and 40 mg at night. Voices that he was given the 80mg  refills but they forgot to give the script for the 40 mg furosemide.  Patient is requesting the 40 mg furosemide  be sent to her pharmacy. Routing to PCP

## 2022-10-03 NOTE — Progress Notes (Signed)
Subjective:    Patient ID: Caleb [name], male    DOB: [birth year], 72 y.o.   MRN: [id number]  HPI  Pt is a 72 yr old male with hx of R BKA due to osteomyelitis of foot; also has HTN, DM A1c 7.9; on 70/30 at home; sCHF with EF 25-30%; aortic stenosis s/p AVR 10/20203; CAD; CKD3A; PAD; and GERD Here for hospital f/u on R BKA.   R BKA- has 2 staples left in- a place that wasn't completely closed.   Goes back tomorrow to Dr Lajoyce Corners- no real drainage.  Hasn't been on pain meds for 3.5 weeks-   Stayed with cousin for 1 week, not 2 weeks like supposed to.   Pain started subside after 2 weeks after d/c . Took gabapentin-  A few times that hurt pretty bad- got up to 4/10- at most- due to stocking- made him question if needs opiate, but didn't push him to taking any pain meds.   Spot on finger went away with Triamcinolone  and has another spot on tip of finger on 2nd R digit.    Trying to go to [facility] for prothesis.  Currently has been doing Technical sales engineer-   [facility]- she was supposed to send Rx for prosthesis clinic- doesn't know where that is Going to keep appt with [facility] 7/29-   Taking miralax and dulcolax pills and working well.   Not dealing with nerve pain/phantom pain- very little now- usually ~ 1/10- doesn't occur often, or even daily.  Wearing limb guard for a few hours, can notice it then.   Feels like toes bothering him.     Pain Inventory Average Pain 0 Pain Right Now 0 My pain is  n/a  In the last 24 hours, has pain interfered with the following? General activity 0 Relation with others 0 Enjoyment of life 0 What TIME of day is your pain at its worst? varies Sleep (in general) Good  Pain is worse with:  n/a Pain improves with:  np pain Relief from Meds: 0  ability to climb steps?  no do you drive?  no use a wheelchair transfers alone  retired I need assistance with the following:  household duties and shopping Do you have any goals in this area?   yes  numbness  Any changes since last visit?  no  Hospital follow up    Family History  Problem Relation Age of Onset   CAD Father    Hypertension Father    Alcohol abuse Father        Cause of death   Diabetes Mother    Colon polyps Mother    CAD Brother 68       CABG   Colon cancer Neg Hx    Esophageal cancer Neg Hx    Rectal cancer Neg Hx    Stomach cancer Neg Hx    Social History   Socioeconomic History   Marital status: Widowed    Spouse name: Not on file   Number of children: 3   Years of education: Not on file   Highest education level: High school graduate  Occupational History   Occupation: Caregiver    Comment: Special needs    Comment: retired  Tobacco Use   Smoking status: Never    Passive exposure: Never   Smokeless tobacco: Never  Vaping Use   Vaping status: Never Used  Substance and Sexual Activity   Alcohol use: Yes  Comment: occasional   Drug use: No   Sexual activity: Not Currently  Other Topics Concern   Not on file  Social History Narrative   Widower.  3 daughters and 3 grandchildren.     Social Determinants of Health   Financial Resource Strain: Low Risk  (10/29/2021)   Overall Financial Resource Strain (CARDIA)    Difficulty of Paying Living Expenses: Not hard at all  Food Insecurity: No Food Insecurity (08/19/2022)   Hunger Vital Sign    Worried About Running Out of Food in the Last Year: Never true    Ran Out of Food in the Last Year: Never true  Transportation Needs: No Transportation Needs (08/19/2022)   PRAPARE - Administrator, Civil Service (Medical): No    Lack of Transportation (Non-Medical): No  Physical Activity: Sufficiently Active (12/06/2020)   Exercise Vital Sign    Days of Exercise per Week: 4 days    Minutes of Exercise per Session: 60 min  Stress: No Stress Concern Present (12/06/2020)   [employer] of Occupational Health - Occupational Stress Questionnaire    Feeling of Stress : Not at all   Social Connections: Moderately Isolated (12/06/2020)   Social Connection and Isolation Panel [NHANES]    Frequency of Communication with Friends and Family: More than three times a week    Frequency of Social Gatherings with Friends and Family: More than three times a week    Attends Religious Services: More than 4 times per year    Active Member of [employer] or Organizations: No    Attends Banker Meetings: Never    Marital Status: Widowed   Past Surgical History:  Procedure Laterality Date   ABDOMINAL AORTOGRAM W/LOWER EXTREMITY N/A 08/08/2019   Procedure: ABDOMINAL AORTOGRAM W/LOWER EXTREMITY;  Surgeon: Runell Gess, MD;  Location: [facility] INVASIVE [facility] LAB;  Service: Cardiovascular;  Laterality: N/A;   ABDOMINAL AORTOGRAM W/LOWER EXTREMITY N/A 11/18/2019   Procedure: ABDOMINAL AORTOGRAM W/LOWER EXTREMITY;  Surgeon: Runell Gess, MD;  Location: [facility];  Service: Cardiovascular;  Laterality: N/A;   ABDOMINAL AORTOGRAM W/LOWER EXTREMITY N/A 06/13/2022   Procedure: ABDOMINAL AORTOGRAM W/LOWER EXTREMITY;  Surgeon: Chuck Hint, MD;  Location: [facility] INVASIVE CV LAB;  Service: Cardiovascular;  Laterality: N/A;   AMPUTATION Right 08/19/2022   Procedure: RIGHT BELOW KNEE AMPUTATION;  Surgeon: Nadara Mustard, MD;  Location: [facility] OR;  Service: Orthopedics;  Laterality: Right;   APPLICATION OF WOUND VAC  06/24/2022   Procedure: APPLICATION OF WOUND VAC;  Surgeon: Nadara Mustard, MD;  Location: [facility] OR;  Service: Orthopedics;;   CARDIAC CATHETERIZATION     COLONOSCOPY  12 years ago   in [place] clinic= normal exam per pt   ENDARTERECTOMY FEMORAL Right 06/17/2022   Procedure: SUPERFICIAL ENDARTERECTOMY OF COMMON FEMORAL ARTERY;  Surgeon: Victorino Sparrow, MD;  Location: [facility] OR;  Service: Vascular;  Laterality: Right;   FEMORAL-TIBIAL BYPASS GRAFT Right 06/17/2022   Procedure: RIGHT FEMORAL- ANTERIOR TIBIAL ARTERY BYPASS;  Surgeon: Victorino Sparrow, MD;  Location: [facility] OR;  Service:  Vascular;  Laterality: Right;   I & D EXTREMITY Right 06/24/2022   Procedure: RIGHT PARTIAL CALCANEUS EXCISION;  Surgeon: Nadara Mustard, MD;  Location: [facility] OR;  Service: Orthopedics;  Laterality: Right;   I & D EXTREMITY Right 07/29/2022   Procedure: RIGHT PARTIAL CALCANEUS EXCISION;  Surgeon: Nadara Mustard, MD;  Location: [facility] OR;  Service: Orthopedics;  Laterality: Right;   INTRAOPERATIVE TRANSTHORACIC ECHOCARDIOGRAM  N/A 01/11/2022   Procedure: INTRAOPERATIVE TRANSTHORACIC ECHOCARDIOGRAM;  Surgeon: Tonny Bollman, MD;  Location: [facility] INVASIVE CV LAB;  Service: Open Heart Surgery;  Laterality: N/A;   KNEE SURGERY     MULTIPLE EXTRACTIONS WITH ALVEOLOPLASTY N/A 12/09/2021   Procedure: MULTIPLE EXTRACTION;  Surgeon: Sharman Cheek, DMD;  Location: [facility] OR;  Service: Dentistry;  Laterality: N/A;   PERIPHERAL VASCULAR INTERVENTION Right 08/08/2019   Procedure: PERIPHERAL VASCULAR INTERVENTION;  Surgeon: Runell Gess, MD;  Location: [facility];  Service: Cardiovascular;  Laterality: Right;   PERIPHERAL VASCULAR INTERVENTION Left 11/18/2019   Procedure: PERIPHERAL VASCULAR INTERVENTION;  Surgeon: Runell Gess, MD;  [facility]: [facility];  Service: Cardiovascular;  Laterality: Left;  left popliteal artery   RIGHT/LEFT HEART CATH AND CORONARY ANGIOGRAPHY N/A 10/29/2021   Procedure: RIGHT/LEFT HEART CATH AND CORONARY ANGIOGRAPHY;  Surgeon: Kathleene Hazel, MD;  [facility]: [facility];  Service: Cardiovascular;  Laterality: N/A;   TRANSCAROTID ARTERY REVASCULARIZATION  Left 12/16/2019   Procedure: TRANSCAROTID ARTERY REVASCULARIZATION;  Surgeon: Sherren Kerns, MD;  Location: [facility] OR;  Service: Vascular;  Laterality: Left;   TRANSCAROTID ARTERY REVASCULARIZATION  Right 02/03/2020   Procedure: RIGHT TRANSCAROTID ARTERY REVASCULARIZATION;  Surgeon: Sherren Kerns, MD;  Location: [facility] OR;  Service: Vascular;  Laterality: Right;   TRANSCATHETER AORTIC VALVE REPLACEMENT,  TRANSFEMORAL N/A 01/11/2022   Procedure: Transcatheter Aortic Valve Replacement, Transfemoral;  Surgeon: Tonny Bollman, MD;  Location: [facility] INVASIVE CV LAB;  Service: Open Heart Surgery;  Laterality: N/A;   ULTRASOUND GUIDANCE FOR VASCULAR ACCESS Right 12/16/2019   Procedure: ULTRASOUND GUIDANCE FOR VASCULAR ACCESS;  Surgeon: Sherren Kerns, MD;  Location: [facility] OR;  Service: Vascular;  Laterality: Right;   ULTRASOUND GUIDANCE FOR VASCULAR ACCESS Right 02/03/2020   Procedure: ULTRASOUND GUIDANCE FOR VASCULAR ACCESS;  Surgeon: Sherren Kerns, MD;  Location: [facility] OR;  Service: Vascular;  Laterality: Right;   VASECTOMY     VEIN HARVEST Right 06/17/2022   Procedure: IPSILATERAL RIGHT GREATER SAPHENOUS VEIN HARVEST;  Surgeon: Victorino Sparrow, MD;  Location: [facility] OR;  Service: Vascular;  Laterality: Right;   Past Medical History:  Diagnosis Date   Carotid artery occlusion    CHF (congestive heart failure) (HCC)    Diabetes mellitus without complication (HCC)    Type II   Dyslipidemia 09/27/2012   Erectile dysfunction 09/27/2012   GERD (gastroesophageal reflux disease)    Hypercholesteremia    Hyperlipidemia 08/12/2012   Hypertension    Hyponatremia 08/14/2012   MGUS (monoclonal gammopathy of unknown significance)    Neuropathy    Pancreatitis    Pancreatitis, acute 09/27/2012   Peripheral vascular disease (HCC)    S/P 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 Excell Seltzer & Bartle   Severe aortic stenosis    Vitamin D deficiency 06/23/2015   There were no vitals taken for this visit.  Opioid Risk Score:   Fall Risk Score:  `1  Depression screen PHQ 2/9     05/02/2022    1:48 PM 04/25/2022   10:10 AM 01/20/2022   10:47 AM 07/20/2021    9:47 AM 06/09/2021   10:52 AM 01/19/2021    9:45 AM 12/06/2020    2:09 PM  Depression screen PHQ 2/9  Decreased Interest 0 0 0 0 0 0 0  Down, Depressed, Hopeless 0 0 0 0 0 0 0   PHQ - 2  Score 0 0 0 0 0 0 0  Altered sleeping  0 0 0  0   Tired, decreased energy  1 0 0  0   Change in appetite  0 0 0  0   Feeling bad or failure about yourself   0 0 0  0   Trouble concentrating  0 0 0  0   Moving slowly or fidgety/restless  0 0 0  0   Suicidal thoughts  0 0 0  0   PHQ-9 Score  1 0 0  0   Difficult doing work/chores      Not difficult at all      Review of Systems  All other systems reviewed and are negative.      Objective:   Physical Exam        Assessment & Plan:   Pt is a 72 yr old male with hx of R BKA due to osteomyelitis of foot; also has HTN, DM A1c 7.9; on 70/30 at home; sCHF with EF 25-30%; aortic stenosis s/p AVR 10/20203; CAD; CKD3A; PAD; and GERD Here for hospital f/u on R BKA.    Call [facility]- and see if they handle [facility] patients? Also see if can use VA to cover prosthesis AND a swimming prosthesis.     2.  Moving to [place], [place].  Doesn't plan to live WITH daughters.    3.  Spoken to [place] about modifying car- so can drive- also discussed changing accelerator also to left of brake- - also have option of hand controls and VA will get you driving training.  Should be driving by January or so, in [place] system.   4.  Needs K3 prosthesis. Meets criteria- but would benefit from an additional swimming prosthesis since big swimmer. It's called a fin prosthesis.   5. Once you get prosthesis, call me- don't wait for your October appt- then I can order Outpt therapy.   6. Demonstrated pictures of temporary vs permanent prosthesis.   7.f/u in 3 months- on R BKA     I spent a total of 37   minutes on total care today- >50% coordination of care- due to  D/w pt about prosthetics and options and how to get with [facility] and insurance.

## 2022-10-03 NOTE — Telephone Encounter (Signed)
Prescription has been sent.

## 2022-10-03 NOTE — Patient Instructions (Signed)
Pt is a 72 yr old male with hx of R BKA due to osteomyelitis of foot; also has HTN, DM A1c 7.9; on 70/30 at home; sCHF with EF 25-30%; aortic stenosis s/p AVR 10/20203; CAD; CKD3A; PAD; and GERD Here for hospital f/u on R BKA.    Call [facility]- and see if they handle [facility] patients? Also see if can use VA to cover prosthesis AND a swimming prosthesis.     2.  Moving to [place], [place].  Doesn't plan to live WITH daughters.    3.  Spoken to [place] about modifying car- so can drive- also discussed changing accelerator also to left of brake- - also have option of hand controls and VA will get you driving training.  Should be driving by January or so, in [place] system.   4.  Needs K3 prosthesis. Meets criteria- but would benefit from an additional swimming prosthesis since big swimmer. It's called a fin prosthesis.   5. Once you get prosthesis, call me- don't wait for your October appt- then I can order Outpt therapy.   6. Demonstrated pictures of temporary vs permanent prosthesis.   7.f/u in 3 months- on R BKA

## 2022-10-03 NOTE — Telephone Encounter (Addendum)
Patient called to check the status of this SCAT form, patient states that after tomorrow he won't have any transportation to his any of his appointments and he does have an appointment on Wednesday. Patient stated  this has been ongoing for a few weeks now and he needs this for transportation purposes. Patient also stated that Marsha's sister, Elita Quick, would be able to pick this up due to Mindi Junker being out of town for this week.   Callback #:  [phone] OR Marsha's #: [phone]

## 2022-10-03 NOTE — Telephone Encounter (Signed)
Patient is aware that 40 mg furosemide has been sen to his pharmacy.

## 2022-10-04 ENCOUNTER — Encounter: Payer: Self-pay | Admitting: Family

## 2022-10-04 ENCOUNTER — Ambulatory Visit: Payer: Self-pay | Admitting: *Deleted

## 2022-10-04 ENCOUNTER — Ambulatory Visit: Payer: Medicare HMO | Admitting: Family

## 2022-10-04 DIAGNOSIS — D631 Anemia in chronic kidney disease: Secondary | ICD-10-CM | POA: Diagnosis not present

## 2022-10-04 DIAGNOSIS — I959 Hypotension, unspecified: Secondary | ICD-10-CM | POA: Diagnosis not present

## 2022-10-04 DIAGNOSIS — T8781 Dehiscence of amputation stump: Secondary | ICD-10-CM | POA: Diagnosis not present

## 2022-10-04 DIAGNOSIS — Z89511 Acquired absence of right leg below knee: Secondary | ICD-10-CM

## 2022-10-04 DIAGNOSIS — E1122 Type 2 diabetes mellitus with diabetic chronic kidney disease: Secondary | ICD-10-CM | POA: Diagnosis not present

## 2022-10-04 DIAGNOSIS — R6889 Other general symptoms and signs: Secondary | ICD-10-CM | POA: Diagnosis not present

## 2022-10-04 DIAGNOSIS — N1832 Chronic kidney disease, stage 3b: Secondary | ICD-10-CM | POA: Diagnosis not present

## 2022-10-04 DIAGNOSIS — M86672 Other chronic osteomyelitis, left ankle and foot: Secondary | ICD-10-CM | POA: Diagnosis not present

## 2022-10-04 DIAGNOSIS — I5042 Chronic combined systolic (congestive) and diastolic (congestive) heart failure: Secondary | ICD-10-CM | POA: Diagnosis not present

## 2022-10-04 DIAGNOSIS — E1169 Type 2 diabetes mellitus with other specified complication: Secondary | ICD-10-CM | POA: Diagnosis not present

## 2022-10-04 DIAGNOSIS — I130 Hypertensive heart and chronic kidney disease with heart failure and stage 1 through stage 4 chronic kidney disease, or unspecified chronic kidney disease: Secondary | ICD-10-CM | POA: Diagnosis not present

## 2022-10-04 NOTE — Telephone Encounter (Signed)
Several attempts to reach RN. Left VM to call back each attempt. Routing to practice for PCPs resolution per protocol.

## 2022-10-04 NOTE — Progress Notes (Signed)
Post-Op Visit Note   Patient: Caleb [name]           Date of Birth: 08/03/1950           MRN: [id number] Visit Date: 10/04/2022 PCP: Hoy Register, MD  Chief Complaint:  Chief Complaint  Patient presents with   Right Leg - Routine Post Op    08/19/22 Right BKA    HPI:  HPI The patient is a 72 year old gentleman seen status post below-knee amputation on the right May 31.  Patient is a new right transtibial  amputee.  Patient's current comorbidities are not expected to impact the ability to function with the prescribed prosthesis. Patient verbally communicates a strong desire to use a prosthesis. Patient currently requires mobility aids to ambulate without a prosthesis.  Expects not to use mobility aids with a new prosthesis.  Patient is a K3 level ambulator that spends a lot of time walking around on uneven terrain over obstacles, up and down stairs, and ambulates with a variable cadence.     Ortho Exam On examination right residual limb he has 2 remaining staples these were harvested today without incident the incision is well-healed there is no gaping or drainage or signs of infection  Visit Diagnoses: No diagnosis found.  Plan: Proceed with prosthesis set up he will follow-up in the office on an as-needed basis  Follow-Up Instructions: No follow-ups on file.   Imaging: No results found.  Orders:  No orders of the defined types were placed in this encounter.  No orders of the defined types were placed in this encounter.    PMFS History: Patient Active Problem List   Diagnosis Date Noted   S/P BKA (below knee amputation) unilateral, right (HCC) 10/03/2022   Wheelchair dependence 10/03/2022   Unilateral complete BKA, right, initial encounter (HCC) 08/22/2022   Transaminitis 08/20/2022   Coronary artery disease involving native coronary artery of native heart with angina pectoris (HCC) 08/19/2022   Osteomyelitis of right lower extremity (HCC) 08/18/2022    Ganglion of right ankle and foot 08/12/2022   AKI (acute kidney injury) (HCC) 08/11/2022   Chronic osteomyelitis of left foot (HCC) 08/11/2022   Myoclonus 08/11/2022   Chronic osteomyelitis (HCC) 08/11/2022   Leukocytosis 08/03/2022   Anemia of chronic disease 08/03/2022   Heart failure with reduced ejection fraction (HCC) 08/03/2022   History of osteomyelitis 08/03/2022   Foot ulcer with necrosis of bone (HCC) 07/29/2022   Gastroenteritis 06/15/2022   Acute osteomyelitis of right calcaneus (HCC) 06/09/2022   Diabetic foot ulcer (HCC) 06/09/2022   SIRS (systemic inflammatory response syndrome) (HCC) 06/07/2022   Acute CHF (congestive heart failure) (HCC) 05/27/2022   Decubitus ulcer of right heel 05/27/2022   Nausea and vomiting 05/27/2022   Wild-type transthyretin-related (ATTR) amyloidosis (HCC) 04/26/2022   MGUS (monoclonal gammopathy of unknown significance) 02/15/2022   PAD (peripheral artery disease) (HCC) 01/11/2022   Non-ischemic cardiomyopathy (HCC) 01/11/2022   Stage IIIa chronic kidney disease 01/11/2022   Severe aortic stenosis 01/11/2022   S/P TAVR (transcatheter aortic valve replacement) 01/11/2022   Caries 11/12/2021   Teeth missing 11/12/2021   Retained tooth root 11/12/2021   Chronic apical periodontitis 11/12/2021   Chronic periodontitis 11/12/2021   Accretions on teeth 11/12/2021   Encounter for management of wound VAC 11/12/2021   Phobia of dental procedure 11/12/2021   Defective dental restoration 11/12/2021   Attrition, teeth excessive 11/12/2021   Torus mandibularis 11/12/2021   Diastema of teeth 11/12/2021   Encounter for preoperative  dental examination 11/09/2021   Acute kidney injury superimposed on chronic kidney disease (HCC) 10/30/2021   Chronic systolic CHF (congestive heart failure) (HCC) 10/28/2021   Hypertensive urgency 10/28/2021   DM2 (diabetes mellitus, type 2) (HCC) 07/01/2021   Burn of first degree of multiple sites of unspecified wrist  and hand, initial encounter 12/31/2020   Male erectile disorder (CODE) 12/18/2020   Encounter for sterilization 12/18/2020   Diabetic neuropathy (HCC) 09/11/2020   Low back pain, unspecified 09/11/2020   Unspecified kidney failure 09/11/2020   Unsteadiness on feet 09/11/2020   Status post amputation of lesser toe of left foot (HCC) 07/22/2020   Hypertensive heart disease with chronic combined systolic and diastolic congestive heart failure (HCC) 07/22/2020   Carotid stenosis, asymptomatic 02/03/2020   Peripheral vascular disease (HCC)    Carotid artery stenosis 12/16/2019   Moderate aortic stenosis 12/02/2019   Critical ischemia of foot (HCC) 11/18/2019   Carotid artery disease (HCC) 08/23/2019   Critical limb ischemia with history of revascularization of same extremity (HCC) 08/02/2019   Diabetes mellitus without complication (HCC)    Cardiac murmur 02/22/2018   Vitamin D deficiency 06/23/2015   Pancreatitis, acute 09/27/2012   Dyslipidemia 09/27/2012   Erectile dysfunction 09/27/2012   Essential hypertension 09/27/2012   Obesity, unspecified 08/14/2012   Chronic hyponatremia 08/14/2012   Acute pancreatitis 08/12/2012   HTN (hypertension) 08/12/2012   Hyperlipidemia 08/12/2012   Metabolic acidosis 08/12/2012   Past Medical History:  Diagnosis Date   Carotid artery occlusion    CHF (congestive heart failure) (HCC)    Diabetes mellitus without complication (HCC)    Type II   Dyslipidemia 09/27/2012   Erectile dysfunction 09/27/2012   GERD (gastroesophageal reflux disease)    Hypercholesteremia    Hyperlipidemia 08/12/2012   Hypertension    Hyponatremia 08/14/2012   MGUS (monoclonal gammopathy of unknown significance)    Neuropathy    Pancreatitis    Pancreatitis, acute 09/27/2012   Peripheral vascular disease (HCC)    S/P 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 Excell Seltzer & Bartle   Severe  aortic stenosis    Vitamin D deficiency 06/23/2015    Family History  Problem Relation Age of Onset   CAD Father    Hypertension Father    Alcohol abuse Father        Cause of death   Diabetes Mother    Colon polyps Mother    CAD Brother 85       CABG   Colon cancer Neg Hx    Esophageal cancer Neg Hx    Rectal cancer Neg Hx    Stomach cancer Neg Hx     Past Surgical History:  Procedure Laterality Date   ABDOMINAL AORTOGRAM W/LOWER EXTREMITY N/A 08/08/2019   Procedure: ABDOMINAL AORTOGRAM W/LOWER EXTREMITY;  Surgeon: Runell Gess, MD;  Location: [facility];  Service: Cardiovascular;  Laterality: N/A;   ABDOMINAL AORTOGRAM W/LOWER EXTREMITY N/A 11/18/2019   Procedure: ABDOMINAL AORTOGRAM W/LOWER EXTREMITY;  Surgeon: Runell Gess, MD;  Location: [facility];  Service: Cardiovascular;  Laterality: N/A;   ABDOMINAL AORTOGRAM W/LOWER EXTREMITY N/A 06/13/2022   Procedure: ABDOMINAL AORTOGRAM W/LOWER EXTREMITY;  Surgeon: Chuck Hint, MD;  Location: [facility] INVASIVE CV LAB;  Service: Cardiovascular;  Laterality: N/A;   AMPUTATION Right 08/19/2022   Procedure: RIGHT BELOW KNEE AMPUTATION;  Surgeon: Nadara Mustard, MD;  Location: [facility] OR;  Service: Orthopedics;  Laterality: Right;   APPLICATION OF WOUND VAC  06/24/2022   Procedure: APPLICATION OF WOUND VAC;  Surgeon: Nadara Mustard, MD;  Location: [facility] OR;  Service: Orthopedics;;   CARDIAC CATHETERIZATION     COLONOSCOPY  12 years ago   in [place] clinic= normal exam per pt   ENDARTERECTOMY FEMORAL Right 06/17/2022   Procedure: SUPERFICIAL ENDARTERECTOMY OF COMMON FEMORAL ARTERY;  Surgeon: Victorino Sparrow, MD;  Location: [facility] OR;  Service: Vascular;  Laterality: Right;   FEMORAL-TIBIAL BYPASS GRAFT Right 06/17/2022   Procedure: RIGHT FEMORAL- ANTERIOR TIBIAL ARTERY BYPASS;  Surgeon: Victorino Sparrow, MD;  Location: [facility] OR;  Service: Vascular;  Laterality: Right;   I & D EXTREMITY Right 06/24/2022   Procedure: RIGHT PARTIAL  CALCANEUS EXCISION;  Surgeon: Nadara Mustard, MD;  Location: [facility] OR;  Service: Orthopedics;  Laterality: Right;   I & D EXTREMITY Right 07/29/2022   Procedure: RIGHT PARTIAL CALCANEUS EXCISION;  Surgeon: Nadara Mustard, MD;  Location: [facility] OR;  Service: Orthopedics;  Laterality: Right;   INTRAOPERATIVE TRANSTHORACIC ECHOCARDIOGRAM N/A 01/11/2022   Procedure: INTRAOPERATIVE TRANSTHORACIC ECHOCARDIOGRAM;  Surgeon: Tonny Bollman, MD;  Location: [facility] INVASIVE CV LAB;  Service: Open Heart Surgery;  Laterality: N/A;   KNEE SURGERY     MULTIPLE EXTRACTIONS WITH ALVEOLOPLASTY N/A 12/09/2021   Procedure: MULTIPLE EXTRACTION;  Surgeon: Sharman Cheek, DMD;  Location: [facility] OR;  Service: Dentistry;  Laterality: N/A;   PERIPHERAL VASCULAR INTERVENTION Right 08/08/2019   Procedure: PERIPHERAL VASCULAR INTERVENTION;  Surgeon: Runell Gess, MD;  [facility]: [facility];  Service: Cardiovascular;  Laterality: Right;   PERIPHERAL VASCULAR INTERVENTION Left 11/18/2019   Procedure: PERIPHERAL VASCULAR INTERVENTION;  Surgeon: Runell Gess, MD;  [facility]: [facility];  Service: Cardiovascular;  Laterality: Left;  left popliteal artery   RIGHT/LEFT HEART CATH AND CORONARY ANGIOGRAPHY N/A 10/29/2021   Procedure: RIGHT/LEFT HEART CATH AND CORONARY ANGIOGRAPHY;  Surgeon: Kathleene Hazel, MD;  [facility]: [facility];  Service: Cardiovascular;  Laterality: N/A;   TRANSCAROTID ARTERY REVASCULARIZATION  Left 12/16/2019   Procedure: TRANSCAROTID ARTERY REVASCULARIZATION;  Surgeon: Sherren Kerns, MD;  Location: [facility] OR;  Service: Vascular;  Laterality: Left;   TRANSCAROTID ARTERY REVASCULARIZATION  Right 02/03/2020   Procedure: RIGHT TRANSCAROTID ARTERY REVASCULARIZATION;  Surgeon: Sherren Kerns, MD;  Location: [facility] OR;  Service: Vascular;  Laterality: Right;   TRANSCATHETER AORTIC VALVE REPLACEMENT, TRANSFEMORAL N/A 01/11/2022   Procedure: Transcatheter Aortic Valve Replacement,  Transfemoral;  Surgeon: Tonny Bollman, MD;  Location: [facility] INVASIVE CV LAB;  Service: Open Heart Surgery;  Laterality: N/A;   ULTRASOUND GUIDANCE FOR VASCULAR ACCESS Right 12/16/2019   Procedure: ULTRASOUND GUIDANCE FOR VASCULAR ACCESS;  Surgeon: Sherren Kerns, MD;  Location: [facility] OR;  Service: Vascular;  Laterality: Right;   ULTRASOUND GUIDANCE FOR VASCULAR ACCESS Right 02/03/2020   Procedure: ULTRASOUND GUIDANCE FOR VASCULAR ACCESS;  Surgeon: Sherren Kerns, MD;  Location: [facility] OR;  Service: Vascular;  Laterality: Right;   VASECTOMY     VEIN HARVEST Right 06/17/2022   Procedure: IPSILATERAL RIGHT GREATER SAPHENOUS VEIN HARVEST;  Surgeon: Victorino Sparrow, MD;  Location: [facility] OR;  Service: Vascular;  Laterality: Right;   Social History   Occupational History   Occupation: Caregiver    Comment: Special needs    Comment: retired  Tobacco Use   Smoking status: Never    Passive exposure: Never   Smokeless tobacco: Never  Vaping Use   Vaping status:  Never Used  Substance and Sexual Activity   Alcohol use: Yes    Comment: occasional   Drug use: No   Sexual activity: Not Currently

## 2022-10-04 NOTE — Telephone Encounter (Signed)
med ?'s   Beth from [place] called in to verify if Dr Alvis Lemmings approved pt to take 80 mg instead of 40 of  furosemide and also asked if potassium will be added

## 2022-10-05 ENCOUNTER — Ambulatory Visit: Payer: Self-pay

## 2022-10-05 DIAGNOSIS — E1169 Type 2 diabetes mellitus with other specified complication: Secondary | ICD-10-CM | POA: Diagnosis not present

## 2022-10-05 DIAGNOSIS — M86672 Other chronic osteomyelitis, left ankle and foot: Secondary | ICD-10-CM | POA: Diagnosis not present

## 2022-10-05 DIAGNOSIS — I5042 Chronic combined systolic (congestive) and diastolic (congestive) heart failure: Secondary | ICD-10-CM | POA: Diagnosis not present

## 2022-10-05 DIAGNOSIS — N1832 Chronic kidney disease, stage 3b: Secondary | ICD-10-CM | POA: Diagnosis not present

## 2022-10-05 DIAGNOSIS — I959 Hypotension, unspecified: Secondary | ICD-10-CM | POA: Diagnosis not present

## 2022-10-05 DIAGNOSIS — T8781 Dehiscence of amputation stump: Secondary | ICD-10-CM | POA: Diagnosis not present

## 2022-10-05 DIAGNOSIS — I130 Hypertensive heart and chronic kidney disease with heart failure and stage 1 through stage 4 chronic kidney disease, or unspecified chronic kidney disease: Secondary | ICD-10-CM | POA: Diagnosis not present

## 2022-10-05 DIAGNOSIS — E1122 Type 2 diabetes mellitus with diabetic chronic kidney disease: Secondary | ICD-10-CM | POA: Diagnosis not present

## 2022-10-05 DIAGNOSIS — D631 Anemia in chronic kidney disease: Secondary | ICD-10-CM | POA: Diagnosis not present

## 2022-10-05 NOTE — Telephone Encounter (Addendum)
 See previous note

## 2022-10-05 NOTE — Telephone Encounter (Signed)
   Call to  [facility] from [place] to confirm patient  takes furosemide  80 mg in the am and 40 mg at night. unable to reach message left on VM.

## 2022-10-05 NOTE — Telephone Encounter (Signed)
  Chief Complaint: Caleb [name] called from [facility] to clarify Lasix dose and frequency. Asked about if potassium needs to be restarted since back on Lasix  Disposition: [] ED /[] Urgent Care (no appt availability in office) / [] Appointment(In office/virtual)/ []  [place] [facility]/ [] Home Care/ [] Refused Recommended Disposition /[] [place] [facility]/ [x]  Follow-up with PCP Additional Notes: does pt need a sooner hospital f/u appt Reason for Disposition  Caller has medicine question only, adult not sick, AND triager answers question  Answer Assessment - Initial Assessment Questions 1. REASON FOR CALL or QUESTION: "What is your reason for calling today?" or "How can I best help you?" or "What question do you have that I can help answer?"     Caleb [name] called from [facility] to clarify Lasix dose and frequency. Asked about if potassium needs to be restarted since back on Lasix  Answer Assessment - Initial Assessment Questions 1. NAME of MEDICINE: "What medicine(s) are you calling about?"     lasix 2. QUESTION: "What is your question?" (e.g., double dose of medicine, side effect)     Caleb [name] called from [facility] to clarify Lasix dose and frequency. Asked about if potassium needs to be restarted since back on Lasix  Protocols used: Information Only Call - No Triage-A-AH, Medication Question Call-A-AH

## 2022-10-05 NOTE — Patient Outreach (Signed)
  Care Coordination   Follow Up Visit Note   10/05/2022 Name: Caleb [name] MRN: [id number] DOB: [birth year]  Caleb [name] is a 72 y.o. year old male who sees Hoy Register, MD for primary care. I spoke with  Caleb [name] by phone today.  What matters to the patients health and wellness today?  Patient would like to be fitted for his prothesis. He will get help from the [place] for vehicle modifications.     Goals Addressed             This Visit's Progress    No complications from Decubitus ulcer of right heel       Care Coordination Interventions: Evaluation of current treatment plan related to s/p right BKA  and patient's adherence to plan as established by provider Determined patient is making a full recovery following his recent right BKA Discussed patient is receiving in home PT twice weekly, he has been released by Dr. Lajoyce Corners and is scheduled to f/u with [facility] to be fitted for a prothesis  Determined patient has resumed living alone in his apartment, he has experienced no further falls since last nurse contact, he will move to [place] in January Assessed for DME needs, patient has a walker but is not using, he has loaned crutches but is not using due to having the wrong size, he is relying on his manual W/C until receiving his prothesis  Encouraged patient to ask his PT about obtaining new crutches if PT recommends, he verbalizes understanding  Reviewed upcoming scheduled follow up appointments with patient and determined patient has limited transportation visit remaining through his insurance, he has applied for [employer] but may take 21 days to get final approval Provided patient the contact number for [employer] and encouraged patient to call for eligibility and details as an alternate option for transportation, patient recorded the phone number and is agreeable  Instructed patient to keep his doctor well informed of new symptoms or concerns      Interventions Today    Flowsheet Row Most Recent Value  Chronic Disease   Chronic disease during today's visit Diabetes, Other  [s/p right BKA]  General Interventions   General Interventions Discussed/Reviewed General Interventions Discussed, General Interventions Reviewed, Durable Medical Equipment (DME), Doctor Visits  Doctor Visits Discussed/Reviewed Doctor Visits Reviewed, Doctor Visits Discussed, Specialist, PCP  Durable Medical Equipment (DME) Other  Valetta Close, walker]  Exercise Interventions   Exercise Discussed/Reviewed Physical Activity, Assistive device use and maintanence  Physical Activity Discussed/Reviewed Physical Activity Reviewed, Physical Activity Discussed, Home Exercise Program (HEP)  Education Interventions   Education Provided Provided Education  Provided Verbal Education On When to see the doctor, Medication, Exercise  Nutrition Interventions   Nutrition Discussed/Reviewed Nutrition Discussed, Nutrition Reviewed  Pharmacy Interventions   Pharmacy Dicussed/Reviewed Pharmacy Topics Reviewed, Pharmacy Topics Discussed, Medication Adherence  Safety Interventions   Safety Discussed/Reviewed Safety Discussed, Safety Reviewed, Fall Risk, Home Safety  Home Safety Assistive Devices     '    SDOH assessments and interventions completed:  No     Care Coordination Interventions:  Yes, provided   Follow up plan: Follow up call scheduled for 11/18/22 @1 :30 PM    Encounter Outcome:  Pt. Visit Completed

## 2022-10-05 NOTE — Patient Instructions (Signed)
Visit Information  Thank you for taking time to visit with me today. Please don't hesitate to contact me if I can be of assistance to you.   Following are the goals we discussed today:   Goals Addressed             This Visit's Progress    No complications from Decubitus ulcer of right heel       Care Coordination Interventions: Evaluation of current treatment plan related to s/p right BKA  and patient's adherence to plan as established by provider Determined patient is making a full recovery following his recent right BKA Discussed patient is receiving in home PT twice weekly, he has been released by Dr. Lajoyce Corners and is scheduled to f/u with [facility] to be fitted for a prothesis  Determined patient has resumed living alone in his apartment, he has experienced no further falls since last nurse contact, he will move to [place] in January Assessed for DME needs, patient has a walker but is not using, he has loaned crutches but is not using due to having the wrong size, he is relying on his manual W/C until receiving his prothesis  Encouraged patient to ask his PT about obtaining new crutches if PT recommends, he verbalizes understanding  Reviewed upcoming scheduled follow up appointments with patient and determined patient has limited transportation visit remaining through his insurance, he has applied for SCAT but may take 21 days to get final approval Provided patient the contact number for [employer] and encouraged patient to call for eligibility and details as an alternate option for transportation, patient recorded the phone number and is agreeable  Instructed patient to keep his doctor well informed of new symptoms or concerns         Our next appointment is by telephone on 11/18/22 at 1:30 PM  Please call the care guide team at [phone] if you need to cancel or reschedule your appointment.   If you are experiencing a Mental Health or Behavioral Health Crisis  or need someone to talk to, please call 1-800-273-TALK (toll free, 24 hour hotline)  Patient verbalizes understanding of instructions and care plan provided today and agrees to view in MyChart. Active MyChart status and patient understanding of how to access instructions and care plan via MyChart confirmed with patient.     Delsa Sale, RN, BSN, CCM Care Management Coordinator [facility] Care Management Direct Phone: [phone]

## 2022-10-10 DIAGNOSIS — T8781 Dehiscence of amputation stump: Secondary | ICD-10-CM | POA: Diagnosis not present

## 2022-10-10 DIAGNOSIS — E1122 Type 2 diabetes mellitus with diabetic chronic kidney disease: Secondary | ICD-10-CM | POA: Diagnosis not present

## 2022-10-10 DIAGNOSIS — I130 Hypertensive heart and chronic kidney disease with heart failure and stage 1 through stage 4 chronic kidney disease, or unspecified chronic kidney disease: Secondary | ICD-10-CM | POA: Diagnosis not present

## 2022-10-10 DIAGNOSIS — E1169 Type 2 diabetes mellitus with other specified complication: Secondary | ICD-10-CM | POA: Diagnosis not present

## 2022-10-10 DIAGNOSIS — I5042 Chronic combined systolic (congestive) and diastolic (congestive) heart failure: Secondary | ICD-10-CM | POA: Diagnosis not present

## 2022-10-10 DIAGNOSIS — I959 Hypotension, unspecified: Secondary | ICD-10-CM | POA: Diagnosis not present

## 2022-10-10 DIAGNOSIS — N1832 Chronic kidney disease, stage 3b: Secondary | ICD-10-CM | POA: Diagnosis not present

## 2022-10-10 DIAGNOSIS — D631 Anemia in chronic kidney disease: Secondary | ICD-10-CM | POA: Diagnosis not present

## 2022-10-10 DIAGNOSIS — M86672 Other chronic osteomyelitis, left ankle and foot: Secondary | ICD-10-CM | POA: Diagnosis not present

## 2022-10-11 ENCOUNTER — Telehealth: Payer: Self-pay | Admitting: Orthopedic Surgery

## 2022-10-11 ENCOUNTER — Encounter: Payer: Self-pay | Admitting: *Deleted

## 2022-10-11 ENCOUNTER — Ambulatory Visit: Payer: Self-pay | Admitting: *Deleted

## 2022-10-11 DIAGNOSIS — I959 Hypotension, unspecified: Secondary | ICD-10-CM | POA: Diagnosis not present

## 2022-10-11 DIAGNOSIS — N1832 Chronic kidney disease, stage 3b: Secondary | ICD-10-CM | POA: Diagnosis not present

## 2022-10-11 DIAGNOSIS — I130 Hypertensive heart and chronic kidney disease with heart failure and stage 1 through stage 4 chronic kidney disease, or unspecified chronic kidney disease: Secondary | ICD-10-CM | POA: Diagnosis not present

## 2022-10-11 DIAGNOSIS — I5042 Chronic combined systolic (congestive) and diastolic (congestive) heart failure: Secondary | ICD-10-CM | POA: Diagnosis not present

## 2022-10-11 DIAGNOSIS — E1122 Type 2 diabetes mellitus with diabetic chronic kidney disease: Secondary | ICD-10-CM | POA: Diagnosis not present

## 2022-10-11 DIAGNOSIS — T8781 Dehiscence of amputation stump: Secondary | ICD-10-CM | POA: Diagnosis not present

## 2022-10-11 DIAGNOSIS — E1169 Type 2 diabetes mellitus with other specified complication: Secondary | ICD-10-CM | POA: Diagnosis not present

## 2022-10-11 DIAGNOSIS — M86672 Other chronic osteomyelitis, left ankle and foot: Secondary | ICD-10-CM | POA: Diagnosis not present

## 2022-10-11 DIAGNOSIS — D631 Anemia in chronic kidney disease: Secondary | ICD-10-CM | POA: Diagnosis not present

## 2022-10-11 NOTE — Telephone Encounter (Signed)
I gave him a call and he admitted to filling his pill box incorrectly due to challenges with his dexterity and might have been overmedicated as the person who fills his pill box was out of town. When EMS left BP was 100/63. His Home health nurse filled his pill box correctly and I advised him to skip his Coreg dose tonight.

## 2022-10-11 NOTE — Telephone Encounter (Signed)
This encounter was created in error - please disregard. See addended note from today .

## 2022-10-11 NOTE — Telephone Encounter (Signed)
Nurse from [facility] [place] [facility] returned call reports she has not heard from PCP for further assistance. Reports patient BP rechecked for 79/55 , EMS called and patient drank juice and ate soup. BP went up to 110/ ?. Patient is due to take more BP medications today . Please advise. Reported to nurse patient should recheck BP and not take BP meds if too low. Please advise . [facility]. [phone].

## 2022-10-11 NOTE — Telephone Encounter (Signed)
Patient called. He has lost the RX for [facility] to get new leg. Would like that faxed to [facility].

## 2022-10-11 NOTE — Telephone Encounter (Signed)
Beth : [facility]- home visit Chief Complaint: low BP reading- no symptoms. Waynetta Sandy is calling to report patient has low BP reading- she states she came for her home visit today and reports patient has low BP reading and low glucose reading- patient states he has prepared his own medication this week(not sure what he has taken)-  due to his home health care help being out of town. In review-Beth reports medications in caddy are incorrect- patient has been taking incorrect dosing of BP medication, he is taking lasix 80mg  in am, has multiple Rx for glucose from [place] that are not on medication list. Waynetta Sandy reports she gave patient increased fluid due to low glucose and double dose of lasix- glucose level is up to 104 now but BP still remains at 83/47. Symptoms: patient reports no symptoms  Disposition: [] ED /[] Urgent Care (no appt availability in office) / [] Appointment(In office/virtual)/ []  [place] [facility]/ [] Home Care/ [x] Refused Recommended Disposition /[] [place] [facility]/ []  Follow-up with PCP Additional Notes: Due to low BP level- recommendation is to have patient evaluated within 4 hours- but patient does not have transportation to office and presently office is closed for lunch- unable to contact. I have reviewed current medication list with Waynetta Sandy and she has corrected medication caddy- she is unable to stay with him longer due to other home visit appointments. I will send this information to PCP office high priority because patient will be alone soon. Problems identified- patient taking incorrect medications- and has [facility] medication [facility]- [facility], [facility] and PCP- patient may need dose pack for better medication control. Patient does need appointment in office to discuss medications Beth contact: [phone]

## 2022-10-11 NOTE — Telephone Encounter (Signed)
Routing to PCP for review.

## 2022-10-11 NOTE — Telephone Encounter (Signed)
80/50- pill caddy medication discrepancy, blurred vision- glucose- 78- is now 84 Called last week and patient was advised to restart lasix- and does he need K+ Reason for Disposition  [1] Systolic BP < 90 AND [2] NOT dizzy, lightheaded or weak  Answer Assessment - Initial Assessment Questions 1. BLOOD PRESSURE: "What is the blood pressure?" "Did you take at least two measurements 5 minutes apart?"     80/50, 97/53 2. ONSET: "When did you take your blood pressure?"     today 3. HOW: "How did you obtain the blood pressure?" (e.g., visiting nurse, automatic home BP monitor)     Automatic/manual 4. HISTORY: "Do you have a history of low blood pressure?" "What is your blood pressure normally?"     Back before amputation- he did have low BP- but since no problems 5. MEDICINES: "Are you taking any medications for blood pressure?" If Yes, ask: "Have they been changed recently?"     Unsure what he has been taking- care giver is out of town 6. PULSE RATE: "Do you know what your pulse rate is?"      80  Protocols used: Blood Pressure - Low-A-AH

## 2022-10-12 DIAGNOSIS — I130 Hypertensive heart and chronic kidney disease with heart failure and stage 1 through stage 4 chronic kidney disease, or unspecified chronic kidney disease: Secondary | ICD-10-CM | POA: Diagnosis not present

## 2022-10-12 DIAGNOSIS — D631 Anemia in chronic kidney disease: Secondary | ICD-10-CM | POA: Diagnosis not present

## 2022-10-12 DIAGNOSIS — E1169 Type 2 diabetes mellitus with other specified complication: Secondary | ICD-10-CM | POA: Diagnosis not present

## 2022-10-12 DIAGNOSIS — N1832 Chronic kidney disease, stage 3b: Secondary | ICD-10-CM | POA: Diagnosis not present

## 2022-10-12 DIAGNOSIS — I959 Hypotension, unspecified: Secondary | ICD-10-CM | POA: Diagnosis not present

## 2022-10-12 DIAGNOSIS — E1122 Type 2 diabetes mellitus with diabetic chronic kidney disease: Secondary | ICD-10-CM | POA: Diagnosis not present

## 2022-10-12 DIAGNOSIS — M86672 Other chronic osteomyelitis, left ankle and foot: Secondary | ICD-10-CM | POA: Diagnosis not present

## 2022-10-12 DIAGNOSIS — T8781 Dehiscence of amputation stump: Secondary | ICD-10-CM | POA: Diagnosis not present

## 2022-10-12 DIAGNOSIS — I5042 Chronic combined systolic (congestive) and diastolic (congestive) heart failure: Secondary | ICD-10-CM | POA: Diagnosis not present

## 2022-10-12 NOTE — Progress Notes (Signed)
[facility] [place] Note   History of Present Illness:  Patient is a 72 y.o. year old male who presents for follow up.   Surgical history is outlined below:  Cerebrovascular: Left TCAR on 12/16/2019 by Dr. Darrick Penna and subsequent right TCAR on 02/03/2020 by Dr. Darrick Penna. This was done to treat asymptomatic, high-grade lesions that required treatment due to high risk aortic stenosis with decreased EF.   PAD: He was seen at [facility] on 06/13/22 with a history of angiography and revascularization in the past by Dr. Allyson Sabal.  He was taken for angiography by Dr. Edilia Bo on 06/13/22 and then to the OR by Dr. Karin Lieu for right fem-AT vein bypass for CLI with tissue loss.  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 Lajoyce Corners.   At his most recent follow-up appointment Amier was doing well.  He presents today to discuss known, asymptomatic, restenosis of his left carotid artery stent.   Ernie's daughters live in [place].  He is planning on moving down and March.  He is excited to spend more time with his family.       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 Excell Seltzer & Bartle   Severe aortic stenosis    Vitamin D deficiency 06/23/2015    Past Surgical History:  Procedure Laterality Date   ABDOMINAL AORTOGRAM W/LOWER EXTREMITY N/A 08/08/2019    Procedure: ABDOMINAL AORTOGRAM W/LOWER EXTREMITY;  Surgeon: Runell Gess, MD;  [facility]: [facility] INVASIVE CV LAB;  Service: Cardiovascular;  Laterality: N/A;   ABDOMINAL AORTOGRAM W/LOWER EXTREMITY N/A 11/18/2019   Procedure: ABDOMINAL AORTOGRAM W/LOWER EXTREMITY;  Surgeon: Runell Gess, MD;  Location: [facility]VAS[facility] CV LAB;  Service: Cardiovascular;  Laterality: N/A;   ABDOMINAL AORTOGRAM W/LOWER EXTREMITY N/A 06/13/2022   Procedure: ABDOMINAL AORTOGRAM W/LOWER EXTREMITY;  Surgeon: Chuck Hint, MD;  Location: [facility] INVASIVE CV LAB;  Service: Cardiovascular;  Laterality: N/A;   AMPUTATION Right 08/19/2022   Procedure: RIGHT BELOW KNEE AMPUTATION;  Surgeon: Nadara Mustard, MD;  Location: [facility] OR;  Service: Orthopedics;  Laterality: Right;   APPLICATION OF WOUND VAC  06/24/2022   Procedure: APPLICATION OF WOUND VAC;  Surgeon: Nadara Mustard, MD;  Location: [facility] OR;  Service: Orthopedics;;   CARDIAC CATHETERIZATION     COLONOSCOPY  12 years ago   in [place] clinic= normal exam per pt   ENDARTERECTOMY FEMORAL Right 06/17/2022   Procedure: SUPERFICIAL ENDARTERECTOMY OF COMMON FEMORAL ARTERY;  Surgeon: Victorino Sparrow, MD;  Location: [facility] OR;  Service: Vascular;  Laterality: Right;   FEMORAL-TIBIAL BYPASS GRAFT Right 06/17/2022   Procedure: RIGHT FEMORAL- ANTERIOR TIBIAL ARTERY BYPASS;  Surgeon: Victorino Sparrow, MD;  Location: [facility] OR;  Service: Vascular;  Laterality: Right;   I & D EXTREMITY Right 06/24/2022   Procedure: RIGHT PARTIAL CALCANEUS EXCISION;  Surgeon: Nadara Mustard, MD;  Location: [facility] OR;  Service: Orthopedics;  Laterality: Right;   I & D EXTREMITY Right 07/29/2022   Procedure: RIGHT PARTIAL CALCANEUS EXCISION;  Surgeon: Nadara Mustard, MD;  Location: [facility] OR;  Service: Orthopedics;  Laterality: Right;   INTRAOPERATIVE TRANSTHORACIC ECHOCARDIOGRAM N/A 01/11/2022   Procedure: INTRAOPERATIVE TRANSTHORACIC ECHOCARDIOGRAM;  Surgeon: Tonny Bollman, MD;  Location: [facility] INVASIVE CV LAB;  Service: Open  Heart Surgery;  Laterality: N/A;   KNEE SURGERY     MULTIPLE EXTRACTIONS WITH ALVEOLOPLASTY N/A 12/09/2021   Procedure: MULTIPLE EXTRACTION;  Surgeon: Sharman Cheek, DMD;  Location: [facility] OR;  Service: Dentistry;  Laterality: N/A;   PERIPHERAL VASCULAR INTERVENTION Right 08/08/2019   Procedure: PERIPHERAL VASCULAR INTERVENTION;  Surgeon: Runell Gess, MD;  [facility];  Service: Cardiovascular;  Laterality: Right;   PERIPHERAL VASCULAR INTERVENTION Left 11/18/2019   Procedure: PERIPHERAL VASCULAR INTERVENTION;  Surgeon: Runell Gess, MD;  [facility];  Service: Cardiovascular;  Laterality: Left;  left popliteal artery   RIGHT/LEFT HEART CATH AND CORONARY ANGIOGRAPHY N/A 10/29/2021   Procedure: RIGHT/LEFT HEART CATH AND CORONARY ANGIOGRAPHY;  Surgeon: Kathleene Hazel, MD;  [facility];  Service: Cardiovascular;  Laterality: N/A;   TRANSCAROTID ARTERY REVASCULARIZATION  Left 12/16/2019   Procedure: TRANSCAROTID ARTERY REVASCULARIZATION;  Surgeon: Sherren Kerns, MD;  Location: [facility] OR;  Service: Vascular;  Laterality: Left;   TRANSCAROTID ARTERY REVASCULARIZATION  Right 02/03/2020   Procedure: RIGHT TRANSCAROTID ARTERY REVASCULARIZATION;  Surgeon: Sherren Kerns, MD;  Location[facility] OR;  Service: Vascular;  Laterality: Right;   TRANSCATHETER AORTIC VALVE REPLACEMENT, TRANSFEMORAL N/A 01/11/2022   Procedure: Transcatheter Aortic Valve Replacement, Transfemoral;  Surgeon: Tonny Bollman, MD;  Location: [facility] INVASIVE CV LAB;  Service: Open Heart Surgery;  Laterality: N/A;   ULTRASOUND GUIDANCE FOR VASCULAR ACCESS Right 12/16/2019   Procedure: ULTRASOUND GUIDANCE FOR VASCULAR ACCESS;  Surgeon: Sherren Kerns, MD;  Location: [facility] OR;  Service: Vascular;  Laterality: Right;   ULTRASOUND GUIDANCE FOR VASCULAR ACCESS Right 02/03/2020   Procedure: ULTRASOUND GUIDANCE FOR VASCULAR ACCESS;  Surgeon: Sherren Kerns, MD;  Location: [facility]  OR;  Service: Vascular;  Laterality: Right;   VASECTOMY     VEIN HARVEST Right 06/17/2022   Procedure: IPSILATERAL RIGHT GREATER SAPHENOUS VEIN HARVEST;  Surgeon: Victorino Sparrow, MD;  Location: [facility] OR;  Service: Vascular;  Laterality: Right;    ROS:   General:  No weight loss, Fever, chills  HEENT: No recent headaches, no nasal bleeding, no visual changes, no sore throat  Neurologic: No dizziness, blackouts, seizures. No recent symptoms of stroke or mini- stroke. No recent episodes of slurred speech, or temporary blindness.  Cardiac: No recent episodes of chest pain/pressure, no shortness of breath at rest.  No shortness of breath with exertion.  Denies history of atrial fibrillation or irregular heartbeat  Vascular: No history of rest pain in feet.  No history of claudication.  No history of non-healing ulcer, No history of DVT   Pulmonary: No home oxygen, no productive cough, no hemoptysis,  No asthma or wheezing  Musculoskeletal:  [ ]  Arthritis, [ ]  Low back pain,  [ ]  Joint pain  Hematologic:No history of hypercoagulable state.  No history of easy bleeding.  No history of anemia  Gastrointestinal: No hematochezia or melena,  No gastroesophageal reflux, no trouble swallowing  Urinary: [ ]  chronic Kidney disease, [ ]  on HD - [ ]  MWF or [ ]   TTHS, [ ]  Burning with urination, [ ]  Frequent urination, [ ]  Difficulty urinating;   Skin: No rashes  Psychological: No history of anxiety,  No history of depression  Social History Social History   Tobacco Use   Smoking status: Never    Passive exposure: Never   Smokeless tobacco: Never  Vaping Use   Vaping status: Never Used  Substance Use Topics   Alcohol use: Yes    Comment: occasional   Drug use: No    Family History Family History  Problem Relation Age of Onset   CAD Father    Hypertension Father    Alcohol abuse Father        Cause of death   Diabetes Mother    Colon polyps Mother    CAD Brother 16       CABG    Colon cancer Neg Hx    Esophageal cancer Neg Hx    Rectal cancer Neg Hx    Stomach cancer Neg Hx     Allergies  Allergies  Allergen Reactions   Codeine Nausea And Vomiting   Percocet [Oxycodone-Acetaminophen] Nausea And Vomiting   Tramadol Hcl Nausea And Vomiting     Current Outpatient Medications  Medication Sig Dispense Refill   acetaminophen (TYLENOL) 325 MG tablet Take 1-2 tablets (325-650 mg total) by mouth every 4 (four) hours as needed for mild pain.     apixaban (ELIQUIS) 5 MG TABS tablet Take 1 tablet (5 mg total) by mouth 2 (two) times daily. 180 tablet 1   ascorbic acid (VITAMIN C) 1000 MG tablet Take 1 tablet (1,000 mg total) by mouth daily.     atorvastatin (LIPITOR) 40 MG tablet Take 1 tablet (40 mg total) by mouth in the morning. 90 tablet 1   bisacodyl (DULCOLAX) 5 MG EC tablet Take 1 tablet (5 mg total) by mouth daily as needed for moderate constipation. (Patient not taking: Reported on 09/23/2022) 30 tablet 0   carvedilol (COREG) 3.125 MG tablet Take 1 tablet (3.125 mg total) by mouth 2 (two) times daily with a meal. 180 tablet 1   Cholecalciferol (VITAMIN D-3) 25 MCG (1000 UT) CAPS Take 1,000 Units by mouth daily.     Continuous Blood Gluc Sensor (FREESTYLE LIBRE 14 DAY SENSOR) MISC Inject 1 Device into the skin every 14 (fourteen) days.     [employer] ASPIRIN LOW DOSE 81 MG tablet TAKE 1 TABLET (81 MG TOTAL) BY MOUTH IN THE MORNING 90 tablet 1   docusate sodium (COLACE) 100 MG capsule Take 1 capsule (100 mg total) by mouth daily. 30 capsule 0   furosemide (LASIX) 40 MG tablet Take 1 tablet (40 mg total) by mouth daily. In the evening and an 80 mg tablet daily in the morning. 90 tablet 1   gabapentin (NEURONTIN) 300 MG capsule Take 1 capsule (300 mg total) by mouth 2 (two) times daily AND 2 capsules (600 mg total) at bedtime. 120 capsule 0   HYDROcodone-acetaminophen (NORCO/VICODIN) 5-325 MG tablet Take 1 tablet by mouth every 6 (six) hours as needed for moderate pain.  (Patient not taking: Reported on 09/23/2022) 28 tablet 0   insulin NPH-regular Human (70-30) 100 UNIT/ML injection Inject 5 Units into the skin 2 (two) times daily. With breakfast and with dinner 10 mL 11   isosorbide mononitrate (IMDUR) 30 MG 24 hr tablet Take 0.5 tablets (15 mg total) by mouth daily. 45 tablet 1   melatonin 3 MG TABS tablet Take 1 tablet (3 mg total) by  mouth at bedtime as needed (insomnia). 30 tablet 0   polyethylene glycol (MIRALAX / GLYCOLAX) 17 g packet Take 17 g by mouth daily. 14 each 0   PRESCRIPTION MEDICATION Inject 0.1-1 mLs as directed See admin instructions. Tri-Mix Standard Strength 5 ml. Formula: Prostaglandin 10 mcg/ml, Papaverine 30 mg/ml, Phentolamine 1 mg/ml. Inject 0.1 ml into side of penis as directed as needed (to be injected immediately before sexual intercourse) may increase the dose by 0.1 ml every 48 hours to achieve an erection. Max dose 1 ml. (Patient not taking: Reported on 08/18/2022)     triamcinolone cream (KENALOG) 0.1 % Apply topically 2 (two) times daily. (Patient not taking: Reported on 09/23/2022) 30 g 0   vitamin B-12 (CYANOCOBALAMIN) 500 MCG tablet Take 500 mcg by mouth in the morning.     zinc sulfate 220 (50 Zn) MG capsule Take 1 capsule (220 mg total) by mouth daily. (Patient not taking: Reported on 09/23/2022)     No current facility-administered medications for this visit.    Physical Examination  There were no vitals filed for this visit.   There is no height or weight on file to calculate BMI.  General:  Alert and oriented, no acute distress HEENT: Normal Neck: No bruit or JVD Pulmonary: Clear to auscultation bilaterally Cardiac: Regular Rate and Rhythm without murmur Abdomen: Soft, non-tender, non-distended, no mass, no scars Skin: No rash Extremity Pulses:  radial, right BKA, left foot edema     Musculoskeletal: left foot and ankle edema 0.5 cm black " blood blister" on the later left foot.  No open wounds   Neurologic:  Upper and lower extremity motor 5/5 and symmetric  DATA:  ABI Findings:  +--------+------------------+-----+--------+--------+  Right  Rt Pressure (mmHg)IndexWaveformComment   +--------+------------------+-----+--------+--------+  [id number]                                     +--------+------------------+-----+--------+--------+  PTA                                   BKA       +--------+------------------+-----+--------+--------+  DP                                    BKA       +--------+------------------+-----+--------+--------+   +---------+------------------+-----+-------------------+-------+  Left    Lt Pressure (mmHg)IndexWaveform           Comment  +---------+------------------+-----+-------------------+-------+  Brachial 135                                                +---------+------------------+-----+-------------------+-------+  PTA     102               0.76 dampened monophasic         +---------+------------------+-----+-------------------+-------+  DP      107               0.79 monophasic                  +---------+------------------+-----+-------------------+-------+  Great Toe82                0.61                             +---------+------------------+-----+-------------------+-------+   +-------+-----------+-----------+-----------------------+------------+  ABI/TBIToday's ABIToday's TBIPrevious ABI           Previous TBI  +-------+-----------+-----------+-----------------------+------------+  Right BKA        BKA        0.70                   absent        +-------+-----------+-----------+-----------------------+------------+  Left  0.79       0.61       0.94 noted as calcified0.44          +-------+-----------+-----------+-----------------------+------------+     Right Carotid Findings:  +----------+--------+--------+--------+------------------+--------+           PSV cm/sEDV  cm/sStenosisPlaque DescriptionComments  +----------+--------+--------+--------+------------------+--------+  CCA Prox  82      14                                          +----------+--------+--------+--------+------------------+--------+  CCA Mid   117     20                                          +----------+--------+--------+--------+------------------+--------+  CCA Distal107     19                                          +----------+--------+--------+--------+------------------+--------+  ICA Prox                                            stent     +----------+--------+--------+--------+------------------+--------+  ICA Mid                                             stent     +----------+--------+--------+--------+------------------+--------+  ICA Distal45      16                                          +----------+--------+--------+--------+------------------+--------+  ECA      159                                                 +----------+--------+--------+--------+------------------+--------+   +----------+--------+-------+----------------+-------------------+           PSV cm/sEDV cmsDescribe        Arm Pressure (mmHG)  +----------+--------+-------+----------------+-------------------+  Subclavian102    5      Multiphasic, WNL96                   +----------+--------+-------+----------------+-------------------+   +---------+--------+--+--------+-+---------+  VertebralPSV cm/s35EDV cm/s8Antegrade  +---------+--------+--+--------+-+---------+      Right Stent(s):  +---------------+---+--++++  Prox to Stent  84 14  +---------------+---+--++++  Proximal Stent 10930  +---------------+---+--++++  Mid Stent      11340  +---------------+---+--++++  Distal Stent   11534  +---------------+---+--++++  Distal to [phone]  +---------------+---+--++++   Left  Carotid Findings:  +----------+--------+--------+--------+------------------+--------+           PSV cm/sEDV cm/sStenosisPlaque DescriptionComments  +----------+--------+--------+--------+------------------+--------+  CCA Prox  67      13              homogeneous                 +----------+--------+--------+--------+------------------+--------+  CCA Mid   49      9               heterogenous                +----------+--------+--------+--------+------------------+--------+  CCA Distal65      15              heterogenous                +----------+--------+--------+--------+------------------+--------+  ICA Prox                          heterogenous      stent     +----------+--------+--------+--------+------------------+--------+  ICA Mid                                             stent     +----------+--------+--------+--------+------------------+--------+  ICA Distal36      11                                          +----------+--------+--------+--------+------------------+--------+  ECA      277     6       >50%    heterogenous                +----------+--------+--------+--------+------------------+--------+   +----------+--------+--------+----------------+-------------------+           PSV cm/sEDV cm/sDescribe        Arm Pressure (mmHG)  +----------+--------+--------+----------------+-------------------+  [id number]    2       Multiphasic, ZJI96                   +----------+--------+--------+----------------+-------------------+   +---------+--------+--+--------+--+---------+  VertebralPSV cm/s51EDV cm/s18Antegrade  +---------+--------+--+--------+--+---------+      Left Stent(s):  +---------------+---+---+-------------+++  Prox to Stent  49 12                +---------------+---+---+-------------+++  Proximal Stent 317128>75% stenosis  +---------------+---+---+-------------+++   Mid Stent      19458                +---------------+---+---+-------------+++  Distal Stent   11145                +---------------+---+---+-------------+++  Distal to [id number]                +---------------+---+---+-------------+++         ASSESSMENT/PLAN:   PAD now with right BKA by Dr. Lajoyce Corners -pending [employer] prosthesis. He has had previous surgery on the left foot . The ABI shows 0.79 index of the left LE.  This is unchanged.  Toe pressures sufficient for wound healing.  He does have a 0.5 cm black spot with healthy dermis surrounding it.  It looks like an old blister, with retained, hemolyzed blood.    He has a history of left  TCAR on 12/16/2019 by Dr. Darrick Penna and subsequent right TCAR on 02/03/2020 by Dr. Darrick Penna. This was done to treat asymptomatic, high-grade lesions that required treatment due to high risk aortic stenosis with decreased EF.  Carotid duplex ultrasound was reviewed and compared to his last ultrasound which took place in February. There has been no significant change  The left-sided carotid stent continues to have significant stenosis, however velocities demonstrate stenosis less than 80%.  Studies suggest carotid in-stent restenosis lesions can be followed to higher velocities with a lower risk of stroke due to continued laminar flow.  I had a long discussion with Kennan regarding the above.  My plan would be to fix his carotid should his velocities exceed 350 cm/s which is indicative of greater than 80% stenosis.  He is aware that I cannot ensure he will not have a stroke in the interim, but is comfortable with this follow-up plan as he wants to continue his rehab journey to ambulate again.  I will see him in 6 months to reevaluate the lesion.  I asked that he continue his current medication regimen.  He was asked to call my office should any questions or concerns arise.    Victorino Sparrow [facility] [place] Office:  [phone]

## 2022-10-14 ENCOUNTER — Ambulatory Visit (INDEPENDENT_AMBULATORY_CARE_PROVIDER_SITE_OTHER): Payer: No Typology Code available for payment source | Admitting: Vascular Surgery

## 2022-10-14 ENCOUNTER — Ambulatory Visit (HOSPITAL_COMMUNITY)
Admission: RE | Admit: 2022-10-14 | Discharge: 2022-10-14 | Disposition: A | Discharge status: Home / Self Care | Payer: No Typology Code available for payment source | Source: Ambulatory Visit | Attending: Vascular Surgery | Admitting: Vascular Surgery

## 2022-10-14 ENCOUNTER — Other Ambulatory Visit: Payer: Self-pay | Admitting: *Deleted

## 2022-10-14 ENCOUNTER — Encounter: Payer: Self-pay | Admitting: Vascular Surgery

## 2022-10-14 ENCOUNTER — Ambulatory Visit: Payer: Self-pay | Admitting: *Deleted

## 2022-10-14 VITALS — BP 119/70 | HR 73 | Temp 97.7°F | Resp 20 | Wt 161.0 lb

## 2022-10-14 DIAGNOSIS — I6523 Occlusion and stenosis of bilateral carotid arteries: Secondary | ICD-10-CM | POA: Insufficient documentation

## 2022-10-14 DIAGNOSIS — I6522 Occlusion and stenosis of left carotid artery: Secondary | ICD-10-CM | POA: Diagnosis not present

## 2022-10-14 DIAGNOSIS — R6889 Other general symptoms and signs: Secondary | ICD-10-CM | POA: Diagnosis not present

## 2022-10-14 NOTE — Telephone Encounter (Signed)
Pt states that he has an appt on August 20th and is needing transportation for this appt.   Pt states that he also need to schedule to have surgery on his neck to open up the stent on the left side of his neck and is needing his [employer] transportation set up so he can schedule his surgery.  Pt states if he does not answer the phone when someone call him back to please leave a detailed message.   Please advise and call pt back.

## 2022-10-14 NOTE — Telephone Encounter (Signed)
Spoke with patient . Verified name & DOB    Patient advised that form  have been sent and to whom they were sent to.

## 2022-10-14 NOTE — Telephone Encounter (Signed)
    Summary: verify Rx dosage   Beth with [facility] requests call back to verify Rx dosage for lasix. Cb# [phone]           Chief Complaint: medication question/ clarification for [facility] nurse regarding lasix Symptoms: na Frequency: na Pertinent Negatives: Patient denies na Disposition: [] ED /[] Urgent Care (no appt availability in office) / [] Appointment(In office/virtual)/ []  [facility]/ [] Home Care/ [] Refused Recommended Disposition /[] [facility]/ [x]  Follow-up with PCP Additional Notes:   Please clarify lasix dose and administration. Order unclear. Please advise and call [facility] nurse at #[phone] asap.      Reason for Disposition  NON-URGENT call redirected to PCP's office because it is open  Answer Assessment - Initial Assessment Questions N/A [facility] nurse requesting clarification regarding lasix dose and administration.  Protocols used: No Contact or Duplicate Contact Call-A-AH

## 2022-10-14 NOTE — Telephone Encounter (Signed)
  Call to  [facility] from [place] to confirm patient  takes furosemide  80 mg in the am and 40 mg at night This was also addressed on 10/05/2022.  unable to reach message left on VM.

## 2022-10-14 NOTE — Telephone Encounter (Signed)
Pt is calling back to check on his application form for [employer] (to be able to ride with [place].) Pt states that he spoke with the nurse going on 3 weeks ago and was told that she was going to fill out the form and mail it in. Pt states that he spoke with [employer] this morning and they have still not received the application form and it takes up to 21 days to process.   Please call pt back to discuss.

## 2022-10-18 DIAGNOSIS — R6889 Other general symptoms and signs: Secondary | ICD-10-CM | POA: Diagnosis not present

## 2022-10-19 ENCOUNTER — Other Ambulatory Visit: Payer: Self-pay

## 2022-10-19 DIAGNOSIS — I6523 Occlusion and stenosis of bilateral carotid arteries: Secondary | ICD-10-CM

## 2022-10-20 ENCOUNTER — Telehealth: Payer: Self-pay

## 2022-10-20 DIAGNOSIS — E1122 Type 2 diabetes mellitus with diabetic chronic kidney disease: Secondary | ICD-10-CM | POA: Diagnosis not present

## 2022-10-20 DIAGNOSIS — D631 Anemia in chronic kidney disease: Secondary | ICD-10-CM | POA: Diagnosis not present

## 2022-10-20 DIAGNOSIS — N1832 Chronic kidney disease, stage 3b: Secondary | ICD-10-CM | POA: Diagnosis not present

## 2022-10-20 DIAGNOSIS — E1169 Type 2 diabetes mellitus with other specified complication: Secondary | ICD-10-CM | POA: Diagnosis not present

## 2022-10-20 DIAGNOSIS — M86672 Other chronic osteomyelitis, left ankle and foot: Secondary | ICD-10-CM | POA: Diagnosis not present

## 2022-10-20 DIAGNOSIS — T8781 Dehiscence of amputation stump: Secondary | ICD-10-CM | POA: Diagnosis not present

## 2022-10-20 DIAGNOSIS — I959 Hypotension, unspecified: Secondary | ICD-10-CM | POA: Diagnosis not present

## 2022-10-20 DIAGNOSIS — I5042 Chronic combined systolic (congestive) and diastolic (congestive) heart failure: Secondary | ICD-10-CM | POA: Diagnosis not present

## 2022-10-20 DIAGNOSIS — I130 Hypertensive heart and chronic kidney disease with heart failure and stage 1 through stage 4 chronic kidney disease, or unspecified chronic kidney disease: Secondary | ICD-10-CM | POA: Diagnosis not present

## 2022-10-20 NOTE — Telephone Encounter (Signed)
Pt stated his [employer] form is going to be sent back because Dr. did not answer one question. How far can he travel in his wheelchair.  Pt stated he understands that she probably does not know how to answer that question, but it needs to be answered. He stated he needs to be met at the door because it's not safe for him to come out on his own. Pt asked that this be done as soon as possible as the application takes time to be processed and he has no transportation.  Please advise.

## 2022-10-20 NOTE — Telephone Encounter (Signed)
Copied from CRM [id number]. Topic: General - Other >> Oct 20, 2022  3:36 PM Ja-Kwan M wrote: Reason for CRM: Angelique Blonder with [facility] reports that the pt informed her that he is no longer taking the carvedilol or isosorbide mononitrate. Denise requests a call back to advise if the medications were discontinued. Cb# [phone]

## 2022-10-20 NOTE — Telephone Encounter (Signed)
Update form emailed to [email or web address]

## 2022-10-21 NOTE — Telephone Encounter (Signed)
Routing to PCP for review, after reviewing epic medications were filled in July no recent office note to determine if medication was discontinued.

## 2022-10-24 ENCOUNTER — Other Ambulatory Visit: Payer: Self-pay

## 2022-10-24 ENCOUNTER — Ambulatory Visit: Payer: Self-pay

## 2022-10-24 DIAGNOSIS — I130 Hypertensive heart and chronic kidney disease with heart failure and stage 1 through stage 4 chronic kidney disease, or unspecified chronic kidney disease: Secondary | ICD-10-CM | POA: Diagnosis not present

## 2022-10-24 DIAGNOSIS — E1122 Type 2 diabetes mellitus with diabetic chronic kidney disease: Secondary | ICD-10-CM | POA: Diagnosis not present

## 2022-10-24 DIAGNOSIS — I959 Hypotension, unspecified: Secondary | ICD-10-CM | POA: Diagnosis not present

## 2022-10-24 DIAGNOSIS — D631 Anemia in chronic kidney disease: Secondary | ICD-10-CM | POA: Diagnosis not present

## 2022-10-24 DIAGNOSIS — M86672 Other chronic osteomyelitis, left ankle and foot: Secondary | ICD-10-CM | POA: Diagnosis not present

## 2022-10-24 DIAGNOSIS — N1832 Chronic kidney disease, stage 3b: Secondary | ICD-10-CM | POA: Diagnosis not present

## 2022-10-24 DIAGNOSIS — I5042 Chronic combined systolic (congestive) and diastolic (congestive) heart failure: Secondary | ICD-10-CM | POA: Diagnosis not present

## 2022-10-24 DIAGNOSIS — T8781 Dehiscence of amputation stump: Secondary | ICD-10-CM | POA: Diagnosis not present

## 2022-10-24 DIAGNOSIS — E1169 Type 2 diabetes mellitus with other specified complication: Secondary | ICD-10-CM | POA: Diagnosis not present

## 2022-10-24 NOTE — Telephone Encounter (Signed)
I do not see where Coreg or isosorbide was discontinued from his discharge summary in 08/2022.  From a cardiac standpoint he will still need those medications.  He will need an in person office visit with me so I can assess his blood pressure especially since it is uncertain if he has been taking it or not.  I will need him to come to that visit with all his medications.  Thanks.

## 2022-10-24 NOTE — Telephone Encounter (Signed)
  Chief Complaint: Medication question Symptoms: none Frequency:  Pertinent Negatives: Patient denies  Disposition: [] ED /[] Urgent Care (no appt availability in office) / [] Appointment(In office/virtual)/ []  [place] [facility]/ [] Home Care/ [] Refused Recommended Disposition /[] [place] [facility]/ [x]  Follow-up with PCP Additional Notes: Spoke with Beth from [place]. She saw pt today and reports that pt is not taking lasix or coreg. BP is 100/68. Pt has no swelling in his legs, and has no other s/s. Per Waynetta Sandy, pt was told to hold these medications.  Pt needs to know if he should start back on these medications or continue to hold.  Frances Furbish is not due back to see pt until 11/07/2022.  Please advise pt, and [place] regarding medications.  Called pt - He is continuing to hold those medications. Pt feels good.  He does need a refill of Isosorbide and Gabapentin.  When pt left hospital they changed the Gabapentin dosing to 1 300mg  tablet in the morning, 1 300mg  tablet in the afternoon and 2 300mg  tablets at bedtime.  Pt states that this dosing is working well for him.       Summary: bp med?   Nurse, Beth called from [place], states pt hasn't been takng meds, carvdllol lasix, or corege. She says his bp was 100/68            Reason for Disposition  [1] Follow-up call from patient regarding patient's clinical status AND [2] information urgent  Answer Assessment - Initial Assessment Questions 1. REASON FOR CALL or QUESTION: "What is your reason for calling today?" or "How can I best help you?" or "What question do you have that I can help answer?"     Pt has not been taking his Coreg or lasix. Current BP is 100/68  Answer Assessment - Initial Assessment Questions 1. REASON FOR CALL or QUESTION: "What is your reason for calling today?" or "How can I best help you?" or "What question do you have that I can help answer?"      Pt has not been taking his Coreg or lasix. Current BP is  100/68 Should pt continue to hold these medications? 2. CALLER: Document the source of call. (e.g., laboratory, patient).     Beth from [place]  Protocols used: Information Only Call - No Triage-A-AH, PCP Call - No Triage-A-AH

## 2022-10-25 MED ORDER — GABAPENTIN 300 MG PO CAPS: ORAL_CAPSULE | ORAL | 1 refills | Status: AC

## 2022-10-25 MED ORDER — ISOSORBIDE MONONITRATE ER 30 MG PO TB24: 15.0000 mg | ORAL_TABLET | Freq: Every day | ORAL | 1 refills | Status: AC

## 2022-10-25 NOTE — Telephone Encounter (Signed)
Requested Prescriptions  Pending Prescriptions Disp Refills   isosorbide mononitrate (IMDUR) 30 MG 24 hr tablet 45 tablet 1    Sig: Take 0.5 tablets (15 mg total) by mouth daily.     Cardiovascular:  Nitrates Passed - 10/24/2022  5:03 PM      Passed - Last BP in normal range    BP Readings from Last 1 Encounters:  10/14/22 119/70         Passed - Last Heart Rate in normal range    Pulse Readings from Last 1 Encounters:  10/14/22 73         Passed - Valid encounter within last 12 months    Recent Outpatient Visits           1 month ago Hospital discharge follow-up   [facility] [place], [place] W, NP   3 months ago Type 2 diabetes mellitus with other specified complication, with long-term current use of insulin (HCC)   [facility] [place], Odette Horns, MD   6 months ago Hypertension associated with diabetes [facility])   [place] [facility] [place], [place], MD   9 months ago Hypertension associated with diabetes [facility])   [place] [facility] [place], Odette Horns, MD   11 months ago Type 2 diabetes mellitus with diabetic peripheral angiopathy without gangrene, with long-term current use of insulin (HCC)   [place] [facility] [place], Odette Horns, MD       Future Appointments             In 3 weeks Hoy Register, MD [facility]   In 2 months  [facility]             gabapentin (NEURONTIN) 300 MG capsule 120 capsule 0    Sig: Take 1 capsule (300 mg total) by mouth 2 (two) times daily AND 2 capsules (600 mg total) at bedtime.     Neurology: Anticonvulsants - gabapentin Passed - 10/24/2022  5:03 PM      Passed - Cr in normal range and within 360 days    Creatinine  Date Value Ref Range Status  03/09/2022 1.36 (H) 0.61 - 1.24 mg/dL Final   Creat  Date Value Ref Range  Status  06/06/2016 0.86 0.70 - 1.25 mg/dL Final    Comment:      For patients > or = 72 years of age: The upper reference limit for Creatinine is approximately 13% higher for people identified as African-American.      Creatinine, Ser  Date Value Ref Range Status  09/26/2022 1.25 0.76 - 1.27 mg/dL Final   Creatinine, Urine  Date Value Ref Range Status  08/12/2022 78 mg/dL Final    Comment:    Performed at [facility], [address] [place], [place] [address]         Passed - Completed PHQ-2 or PHQ-9 in the last 360 days      Passed - Valid encounter within last 12 months    Recent Outpatient Visits           1 month ago Hospital discharge follow-up   [facility] [place] [facility] [place], [place] W, NP   3 months ago Type 2 diabetes mellitus with other specified complication, with long-term current use of insulin (HCC)   [facility]  Hoy Register, MD   6 months ago Hypertension associated with diabetes [facility])   [place] [facility] [place], [place], MD   9 months ago Hypertension associated with diabetes [facility])   [facility] [place], Odette Horns, MD   11 months ago Type 2 diabetes mellitus with diabetic peripheral angiopathy without gangrene, with long-term current use of insulin [facility])   [place] [facility] Hoy Register, MD       Future Appointments             In 3 weeks Hoy Register, MD [facility]   In 2 months  [facility]

## 2022-10-25 NOTE — Telephone Encounter (Signed)
Unfortunately, I am unable to treat him over the phone and I keep receiving messages about blood pressure concerns.  Please schedule him for an office visit and he needs to come in with all his medications for review.  His isosorbide and gabapentin have been refilled and he needs to hold his Coreg and Lasix if his blood pressure is this low.

## 2022-10-26 NOTE — Telephone Encounter (Signed)
I spoke with the patient about scheduling an appointment to discuss his BP  medication  as we received a call from his home health Nurse. She was inquiring about which medication's patient should continue. Advised I have spoken to Provider and she is recommending that he is seen in the office. Patient scheduled for  10/27/2022 . Transportation will be arranged patient is aware and advised to be ready for pick-up at 10:25 am. Patient is agreeable.

## 2022-10-26 NOTE — Telephone Encounter (Signed)
Patient has appointment for 11/16/2022 does he need to come in early.

## 2022-10-27 ENCOUNTER — Telehealth: Payer: Self-pay

## 2022-10-27 ENCOUNTER — Ambulatory Visit: Payer: Medicare HMO | Admitting: Physician Assistant

## 2022-10-27 DIAGNOSIS — M86672 Other chronic osteomyelitis, left ankle and foot: Secondary | ICD-10-CM | POA: Diagnosis not present

## 2022-10-27 DIAGNOSIS — E1169 Type 2 diabetes mellitus with other specified complication: Secondary | ICD-10-CM | POA: Diagnosis not present

## 2022-10-27 DIAGNOSIS — I130 Hypertensive heart and chronic kidney disease with heart failure and stage 1 through stage 4 chronic kidney disease, or unspecified chronic kidney disease: Secondary | ICD-10-CM | POA: Diagnosis not present

## 2022-10-27 DIAGNOSIS — I959 Hypotension, unspecified: Secondary | ICD-10-CM | POA: Diagnosis not present

## 2022-10-27 DIAGNOSIS — T8781 Dehiscence of amputation stump: Secondary | ICD-10-CM | POA: Diagnosis not present

## 2022-10-27 DIAGNOSIS — I5042 Chronic combined systolic (congestive) and diastolic (congestive) heart failure: Secondary | ICD-10-CM | POA: Diagnosis not present

## 2022-10-27 DIAGNOSIS — N1832 Chronic kidney disease, stage 3b: Secondary | ICD-10-CM | POA: Diagnosis not present

## 2022-10-27 DIAGNOSIS — D631 Anemia in chronic kidney disease: Secondary | ICD-10-CM | POA: Diagnosis not present

## 2022-10-27 DIAGNOSIS — E1122 Type 2 diabetes mellitus with diabetic chronic kidney disease: Secondary | ICD-10-CM | POA: Diagnosis not present

## 2022-10-27 NOTE — Telephone Encounter (Signed)
Triage call from Onalee Hua, physical therapist with [facility]. He is requesting an extension of 1 x week for 2 weeks to progress his mobility, s/p amputation. I did give him the ok, but it sounded like he wants the ok from one of Dr. Audrie Lia assistants (or at least a confirmation). #[phone].

## 2022-10-28 ENCOUNTER — Ambulatory Visit: Payer: Medicare HMO | Admitting: Internal Medicine

## 2022-10-28 NOTE — Telephone Encounter (Signed)
I called and sw Caleb [name] and advised verbal ok for the orders as requested below. Will call with any other questions.

## 2022-11-02 DIAGNOSIS — E1122 Type 2 diabetes mellitus with diabetic chronic kidney disease: Secondary | ICD-10-CM | POA: Diagnosis not present

## 2022-11-02 DIAGNOSIS — D631 Anemia in chronic kidney disease: Secondary | ICD-10-CM | POA: Diagnosis not present

## 2022-11-02 DIAGNOSIS — I959 Hypotension, unspecified: Secondary | ICD-10-CM | POA: Diagnosis not present

## 2022-11-02 DIAGNOSIS — N1832 Chronic kidney disease, stage 3b: Secondary | ICD-10-CM | POA: Diagnosis not present

## 2022-11-02 DIAGNOSIS — E1169 Type 2 diabetes mellitus with other specified complication: Secondary | ICD-10-CM | POA: Diagnosis not present

## 2022-11-02 DIAGNOSIS — T8781 Dehiscence of amputation stump: Secondary | ICD-10-CM | POA: Diagnosis not present

## 2022-11-02 DIAGNOSIS — M86672 Other chronic osteomyelitis, left ankle and foot: Secondary | ICD-10-CM | POA: Diagnosis not present

## 2022-11-02 DIAGNOSIS — I5042 Chronic combined systolic (congestive) and diastolic (congestive) heart failure: Secondary | ICD-10-CM | POA: Diagnosis not present

## 2022-11-02 DIAGNOSIS — I130 Hypertensive heart and chronic kidney disease with heart failure and stage 1 through stage 4 chronic kidney disease, or unspecified chronic kidney disease: Secondary | ICD-10-CM | POA: Diagnosis not present

## 2022-11-07 ENCOUNTER — Ambulatory Visit: Payer: Medicare HMO | Admitting: Physician Assistant

## 2022-11-07 ENCOUNTER — Ambulatory Visit: Payer: Self-pay

## 2022-11-07 NOTE — Telephone Encounter (Signed)
     Chief Complaint: Blood glucose running low last 2-3 days.Last night in middle of the night it was 68. Reports running 115-117.  "I need more groceries, eating mainly sandwiches. I'm going to get my daughter to get me some groceries today." Denies any symptoms. Symptoms: Above Frequency: 2-3 days Pertinent Negatives: Patient denies symptoms Disposition: [] ED /[] Urgent Care (no appt availability in office) / [] Appointment(In office/virtual)/ []  [place] [facility]/ [] Home Care/ [] Refused Recommended Disposition /[] [place] [facility]/ [x]  Follow-up with PCP Additional Notes: Cannot come in for visit, no transportation. Please advise pt.  Reason for Disposition  [1] Caller has URGENT medication or insulin pump question AND [2] triager unable to answer question  Answer Assessment - Initial Assessment Questions 1. SYMPTOMS: "What symptoms are you concerned about?"     Blood sugar 115-117  68 in the middle of the night 2. ONSET:  "When did the symptoms start?"     2-3 days ago 3. BLOOD GLUCOSE: "What is your blood glucose level?"      Above 4. USUAL RANGE: "What is your blood glucose level usually?" (e.g., usual fasting morning value, usual evening value)     Fasting - 110- 117 5. TYPE 1 or 2:  "Do you know what type of diabetes you have?"  (e.g., Type 1, Type 2, Gestational; doesn't know)      Type 1  6. INSULIN: "Do you take insulin?" "What type of insulin(s) do you use? What is the mode of delivery? (syringe, pen; injection or pump) "When did you last give yourself an insulin dose?" (i.e., time or hours/minutes ago) "How much did you give?" (i.e., how many units)     Yes 7. DIABETES PILLS: "Do you take any pills for your diabetes?" If Yes, ask: "What is the name of the medicine(s) that you take for high blood sugar?"     Yes 8. OTHER SYMPTOMS: "Do you have any symptoms?" (e.g., fever, frequent urination, difficulty breathing, vomiting)     No symptoms 9. LOW BLOOD GLUCOSE  TREATMENT: "What have you done so far to treat the low blood glucose level?"     Eating peanut butter jelly sandwiches 10. FOOD: "When did you last eat or drink?"       This morning 11. ALONE: "Are you alone right now or is someone with you?"        Alone 12. PREGNANCY: "Is there any chance you are pregnant?" "When was your last menstrual period?"       N/a  Protocols used: Diabetes - Low Blood Sugar-A-AH

## 2022-11-08 DIAGNOSIS — I130 Hypertensive heart and chronic kidney disease with heart failure and stage 1 through stage 4 chronic kidney disease, or unspecified chronic kidney disease: Secondary | ICD-10-CM | POA: Diagnosis not present

## 2022-11-08 DIAGNOSIS — E1169 Type 2 diabetes mellitus with other specified complication: Secondary | ICD-10-CM | POA: Diagnosis not present

## 2022-11-08 DIAGNOSIS — M86672 Other chronic osteomyelitis, left ankle and foot: Secondary | ICD-10-CM | POA: Diagnosis not present

## 2022-11-08 DIAGNOSIS — E1122 Type 2 diabetes mellitus with diabetic chronic kidney disease: Secondary | ICD-10-CM | POA: Diagnosis not present

## 2022-11-08 DIAGNOSIS — T8781 Dehiscence of amputation stump: Secondary | ICD-10-CM | POA: Diagnosis not present

## 2022-11-08 DIAGNOSIS — I959 Hypotension, unspecified: Secondary | ICD-10-CM | POA: Diagnosis not present

## 2022-11-08 DIAGNOSIS — D631 Anemia in chronic kidney disease: Secondary | ICD-10-CM | POA: Diagnosis not present

## 2022-11-08 DIAGNOSIS — N1832 Chronic kidney disease, stage 3b: Secondary | ICD-10-CM | POA: Diagnosis not present

## 2022-11-08 DIAGNOSIS — I5042 Chronic combined systolic (congestive) and diastolic (congestive) heart failure: Secondary | ICD-10-CM | POA: Diagnosis not present

## 2022-11-16 ENCOUNTER — Telehealth: Payer: Self-pay | Admitting: Physical Therapy

## 2022-11-16 ENCOUNTER — Ambulatory Visit: Payer: Medicare HMO | Admitting: Family Medicine

## 2022-11-16 DIAGNOSIS — S88111A Complete traumatic amputation at level between knee and ankle, right lower leg, initial encounter: Secondary | ICD-10-CM

## 2022-11-16 DIAGNOSIS — Z89511 Acquired absence of right leg below knee: Secondary | ICD-10-CM | POA: Diagnosis not present

## 2022-11-16 NOTE — Telephone Encounter (Signed)
Caleb [name] got his prosthesis today and needs a PT referral placed in Epic please.

## 2022-11-17 ENCOUNTER — Other Ambulatory Visit: Payer: Self-pay

## 2022-11-17 DIAGNOSIS — Z89511 Acquired absence of right leg below knee: Secondary | ICD-10-CM

## 2022-11-17 NOTE — Telephone Encounter (Signed)
Order in chart

## 2022-11-18 ENCOUNTER — Ambulatory Visit: Payer: Self-pay

## 2022-11-18 NOTE — Patient Outreach (Signed)
  Care Coordination   11/18/2022 Name: Caleb [name] MRN: [id number] DOB: 07-22-1950   Care Coordination Outreach Attempts:  An unsuccessful telephone outreach was attempted for a scheduled appointment today.  Follow Up Plan:  Additional outreach attempts will be made to offer the patient care coordination information and services.   Encounter Outcome:  No Answer   Care Coordination Interventions:  No, not indicated    Delsa Sale, RN, BSN, CCM Care Management Coordinator [facility] Care Management  Direct Phone: [phone]

## 2022-11-23 ENCOUNTER — Encounter: Payer: Medicare HMO | Admitting: Physical Therapy

## 2022-11-24 ENCOUNTER — Encounter: Payer: Medicare HMO | Admitting: Physical Therapy

## 2022-11-28 ENCOUNTER — Other Ambulatory Visit: Payer: Self-pay

## 2022-11-28 ENCOUNTER — Encounter: Payer: Medicare HMO | Admitting: Physical Therapy

## 2022-11-28 ENCOUNTER — Ambulatory Visit (INDEPENDENT_AMBULATORY_CARE_PROVIDER_SITE_OTHER): Payer: Medicare HMO | Admitting: Physical Therapy

## 2022-11-28 ENCOUNTER — Encounter: Payer: Self-pay | Admitting: Physical Therapy

## 2022-11-28 DIAGNOSIS — R2689 Other abnormalities of gait and mobility: Secondary | ICD-10-CM | POA: Diagnosis not present

## 2022-11-28 DIAGNOSIS — Z7409 Other reduced mobility: Secondary | ICD-10-CM

## 2022-11-28 DIAGNOSIS — R2681 Unsteadiness on feet: Secondary | ICD-10-CM | POA: Diagnosis not present

## 2022-11-28 DIAGNOSIS — M6281 Muscle weakness (generalized): Secondary | ICD-10-CM

## 2022-11-28 DIAGNOSIS — Z9181 History of falling: Secondary | ICD-10-CM | POA: Diagnosis not present

## 2022-11-28 NOTE — Therapy (Signed)
OUTPATIENT PHYSICAL THERAPY PROSTHETIC EVALUATION   Patient Name: Caleb [name] MRN: [id number] DOB:December 15, 1950, 72 y.o., male Today's Date: 11/28/2022  END OF SESSION:  PT End of Session - 11/28/22 1455     Visit Number 1    Date for PT Re-Evaluation 02/23/23    Authorization Type [employer]    Progress Note Due on Visit 10    PT Start Time 1310    PT Stop Time 1345    PT Time Calculation (min) 35 min    Equipment Utilized During Treatment Gait belt    Activity Tolerance Patient tolerated treatment well;Patient limited by fatigue    Behavior During Therapy WFL for tasks assessed/performed             Past Medical History:  Diagnosis Date   Carotid artery occlusion    CHF (congestive heart failure) (HCC)    Diabetes mellitus without complication (HCC)    Type II   Dyslipidemia 09/27/2012   Erectile dysfunction 09/27/2012   GERD (gastroesophageal reflux disease)    Hypercholesteremia    Hyperlipidemia 08/12/2012   Hypertension    Hyponatremia 08/14/2012   MGUS (monoclonal gammopathy of unknown significance)    Neuropathy    Pancreatitis    Pancreatitis, acute 09/27/2012   Peripheral vascular disease (HCC)    S/P 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 Excell Seltzer & Bartle   Severe aortic stenosis    Vitamin D deficiency 06/23/2015   Past Surgical History:  Procedure Laterality Date   ABDOMINAL AORTOGRAM W/LOWER EXTREMITY N/A 08/08/2019   Procedure: ABDOMINAL AORTOGRAM W/LOWER EXTREMITY;  Surgeon: Runell Gess, MD;  Location: [facility];  Service: Cardiovascular;  Laterality: N/A;   ABDOMINAL AORTOGRAM W/LOWER EXTREMITY N/A 11/18/2019   Procedure: ABDOMINAL AORTOGRAM W/LOWER EXTREMITY;  Surgeon: Runell Gess, MD;  Location: [facility];  Service: Cardiovascular;  Laterality: N/A;   ABDOMINAL AORTOGRAM W/LOWER EXTREMITY N/A 06/13/2022   Procedure: ABDOMINAL  AORTOGRAM W/LOWER EXTREMITY;  Surgeon: Chuck Hint, MD;  Location: [facility] INVASIVE CV LAB;  Service: Cardiovascular;  Laterality: N/A;   AMPUTATION Right 08/19/2022   Procedure: RIGHT BELOW KNEE AMPUTATION;  Surgeon: Nadara Mustard, MD;  Location: [facility] OR;  Service: Orthopedics;  Laterality: Right;   APPLICATION OF WOUND VAC  06/24/2022   Procedure: APPLICATION OF WOUND VAC;  Surgeon: Nadara Mustard, MD;  Location: [facility] OR;  Service: Orthopedics;;   CARDIAC CATHETERIZATION     COLONOSCOPY  12 years ago   in [place] clinic= normal exam per pt   ENDARTERECTOMY FEMORAL Right 06/17/2022   Procedure: SUPERFICIAL ENDARTERECTOMY OF COMMON FEMORAL ARTERY;  Surgeon: Victorino Sparrow, MD;  Location: [facility] OR;  Service: Vascular;  Laterality: Right;   FEMORAL-TIBIAL BYPASS GRAFT Right 06/17/2022   Procedure: RIGHT FEMORAL- ANTERIOR TIBIAL ARTERY BYPASS;  Surgeon: Victorino Sparrow, MD;  Location: [facility] OR;  Service: Vascular;  Laterality: Right;   I & D EXTREMITY Right 06/24/2022   Procedure: RIGHT PARTIAL CALCANEUS EXCISION;  Surgeon: Nadara Mustard, MD;  Location: [facility] OR;  Service: Orthopedics;  Laterality: Right;   I & D EXTREMITY Right 07/29/2022   Procedure: RIGHT PARTIAL CALCANEUS EXCISION;  Surgeon: Nadara Mustard, MD;  Location: [facility] OR;  Service: Orthopedics;  Laterality: Right;   INTRAOPERATIVE TRANSTHORACIC ECHOCARDIOGRAM N/A 01/11/2022   Procedure: INTRAOPERATIVE TRANSTHORACIC ECHOCARDIOGRAM;  Surgeon: Tonny Bollman, MD;  Location: [facility] INVASIVE CV LAB;  Service: Open Heart Surgery;  Laterality: N/A;   KNEE SURGERY     MULTIPLE EXTRACTIONS WITH ALVEOLOPLASTY N/A 12/09/2021   Procedure: MULTIPLE EXTRACTION;  Surgeon: Sharman Cheek, DMD;  Location: [facility] OR;  Service: Dentistry;  Laterality: N/A;   PERIPHERAL VASCULAR INTERVENTION Right 08/08/2019   Procedure: PERIPHERAL VASCULAR INTERVENTION;  Surgeon: Runell Gess, MD;  [facility]: [facility];  Service: Cardiovascular;  Laterality: Right;   PERIPHERAL  VASCULAR INTERVENTION Left 11/18/2019   Procedure: PERIPHERAL VASCULAR INTERVENTION;  Surgeon: Runell Gess, MD;  [facility]: [facility];  Service: Cardiovascular;  Laterality: Left;  left popliteal artery   RIGHT/LEFT HEART CATH AND CORONARY ANGIOGRAPHY N/A 10/29/2021   Procedure: RIGHT/LEFT HEART CATH AND CORONARY ANGIOGRAPHY;  Surgeon: Kathleene Hazel, MD;  [facility]: [facility];  Service: Cardiovascular;  Laterality: N/A;   TRANSCAROTID ARTERY REVASCULARIZATION  Left 12/16/2019   Procedure: TRANSCAROTID ARTERY REVASCULARIZATION;  Surgeon: Sherren Kerns, MD;  Location: [facility] OR;  Service: Vascular;  Laterality: Left;   TRANSCAROTID ARTERY REVASCULARIZATION  Right 02/03/2020   Procedure: RIGHT TRANSCAROTID ARTERY REVASCULARIZATION;  Surgeon: Sherren Kerns, MD;  Location: [facility] OR;  Service: Vascular;  Laterality: Right;   TRANSCATHETER AORTIC VALVE REPLACEMENT, TRANSFEMORAL N/A 01/11/2022   Procedure: Transcatheter Aortic Valve Replacement, Transfemoral;  Surgeon: Tonny Bollman, MD;  Location: [facility] INVASIVE CV LAB;  Service: Open Heart Surgery;  Laterality: N/A;   ULTRASOUND GUIDANCE FOR VASCULAR ACCESS Right 12/16/2019   Procedure: ULTRASOUND GUIDANCE FOR VASCULAR ACCESS;  Surgeon: Sherren Kerns, MD;  Location: [facility] [place] OR;  Service: Vascular;  Laterality: Right;   ULTRASOUND GUIDANCE FOR VASCULAR ACCESS Right 02/03/2020   Procedure: ULTRASOUND GUIDANCE FOR VASCULAR ACCESS;  Surgeon: Sherren Kerns, MD;  Location: [facility] OR;  Service: Vascular;  Laterality: Right;   VASECTOMY     VEIN HARVEST Right 06/17/2022   Procedure: IPSILATERAL RIGHT GREATER SAPHENOUS VEIN HARVEST;  Surgeon: Victorino Sparrow, MD;  Location: [facility] OR;  Service: Vascular;  Laterality: Right;   Patient Active Problem List   Diagnosis Date Noted   S/P BKA (below knee amputation) unilateral, right (HCC) 10/03/2022   Wheelchair dependence 10/03/2022   Unilateral complete BKA, right, initial encounter  (HCC) 08/22/2022   Transaminitis 08/20/2022   Coronary artery disease involving native coronary artery of native heart with angina pectoris (HCC) 08/19/2022   Osteomyelitis of right lower extremity (HCC) 08/18/2022   Ganglion of right ankle and foot 08/12/2022   AKI (acute kidney injury) (HCC) 08/11/2022   Chronic osteomyelitis of left foot (HCC) 08/11/2022   Myoclonus 08/11/2022   Chronic osteomyelitis (HCC) 08/11/2022   Leukocytosis 08/03/2022   Anemia of chronic disease 08/03/2022   Heart failure with reduced ejection fraction (HCC) 08/03/2022   History of osteomyelitis 08/03/2022   Foot ulcer with necrosis of bone (HCC) 07/29/2022   Gastroenteritis 06/15/2022   Acute osteomyelitis of right calcaneus (HCC) 06/09/2022   Diabetic foot ulcer (HCC) 06/09/2022   SIRS (systemic inflammatory response syndrome) (HCC) 06/07/2022   Acute CHF (congestive heart failure) (HCC) 05/27/2022   Decubitus ulcer of right heel 05/27/2022   Nausea and vomiting 05/27/2022   Wild-type transthyretin-related (ATTR) amyloidosis (HCC) 04/26/2022   MGUS (monoclonal gammopathy of unknown significance) 02/15/2022   PAD (peripheral artery disease) (HCC) 01/11/2022   Non-ischemic cardiomyopathy (HCC) 01/11/2022   Stage IIIa chronic kidney disease 01/11/2022   Severe aortic stenosis 01/11/2022   S/P TAVR (transcatheter aortic valve replacement) 01/11/2022   Caries 11/12/2021   Teeth missing  11/12/2021   Retained tooth root 11/12/2021   Chronic apical periodontitis 11/12/2021   Chronic periodontitis 11/12/2021   Accretions on teeth 11/12/2021   Encounter for management of wound VAC 11/12/2021   Phobia of dental procedure 11/12/2021   Defective dental restoration 11/12/2021   Attrition, teeth excessive 11/12/2021   Torus mandibularis 11/12/2021   Diastema of teeth 11/12/2021   Encounter for preoperative dental examination 11/09/2021   Acute kidney injury superimposed on chronic kidney disease (HCC)  10/30/2021   Chronic systolic CHF (congestive heart failure) (HCC) 10/28/2021   Hypertensive urgency 10/28/2021   DM2 (diabetes mellitus, type 2) (HCC) 07/01/2021   Burn of first degree of multiple sites of unspecified wrist and hand, initial encounter 12/31/2020   Male erectile disorder (CODE) 12/18/2020   Encounter for sterilization 12/18/2020   Diabetic neuropathy (HCC) 09/11/2020   Low back pain, unspecified 09/11/2020   Unspecified kidney failure 09/11/2020   Unsteadiness on feet 09/11/2020   Status post amputation of lesser toe of left foot (HCC) 07/22/2020   Hypertensive heart disease with chronic combined systolic and diastolic congestive heart failure (HCC) 07/22/2020   Carotid stenosis, asymptomatic 02/03/2020   Peripheral vascular disease (HCC)    Carotid artery stenosis 12/16/2019   Moderate aortic stenosis 12/02/2019   Critical ischemia of foot (HCC) 11/18/2019   Carotid artery disease (HCC) 08/23/2019   Critical limb ischemia with history of revascularization of same extremity (HCC) 08/02/2019   Diabetes mellitus without complication (HCC)    Cardiac murmur 02/22/2018   Vitamin D deficiency 06/23/2015   Pancreatitis, acute 09/27/2012   Dyslipidemia 09/27/2012   Erectile dysfunction 09/27/2012   Essential hypertension 09/27/2012   Obesity, unspecified 08/14/2012   Chronic hyponatremia 08/14/2012   Acute pancreatitis 08/12/2012   HTN (hypertension) 08/12/2012   Hyperlipidemia 08/12/2012   Metabolic acidosis 08/12/2012    PCP:  Hoy Register, MD  REFERRING PROVIDER: Barnie Del, NP  ONSET DATE: 11/17/2022 prosthesis delivery  REFERRING DIAG: Z89.511 (ICD-10-CM) - S/P BKA (below knee amputation) unilateral, right  THERAPY DIAG:  Other abnormalities of gait and mobility  Unsteadiness on feet  Muscle weakness (generalized)  History of falling  Impaired functional mobility, balance, gait, and endurance  Rationale for Evaluation and Treatment:  Rehabilitation  SUBJECTIVE:   SUBJECTIVE STATEMENT: This 72yo male underwent a right Transtibial Amputation on 08/19/2022 due to non-healing wound with osteomyelitis. He had Fem-Ant Tibial Bypass surgery 06/17/2022 and 2 surgeries for right partial Calcaneus excision (06/24/22 & 07/29/22). He received his prosthesis on 11/17/2022.   PERTINENT HISTORY: Right TTA 08/19/22, DM2, Aortic Valve Replacement 01/11/22, CAD, CKD st IIIa, LBP  PAIN:  Are you having pain? No  PRECAUTIONS: Fall  WEIGHT BEARING RESTRICTIONS: No  FALLS: Has patient fallen in last 6 months? Yes. Number of falls 2 once in hospital & 2nd within week of going home.  No issues.   LIVING ENVIRONMENT: Lives with: lives alone Lives in: apartment Home Access: threshold entry Home layout: One level Stairs: No Has following equipment at home: Environmental consultant - 2 wheeled, Wheelchair (manual), and Tour manager  OCCUPATION:  retired from [employer]  PLOF: Independent, Independent with household mobility without device, Independent with community mobility without device, and Leisure: swimming & walk  PATIENT GOALS:   to use prosthesis including walking for exercise, swim, travel mainly driving  OBJECTIVE:  COGNITION: Overall cognitive status: Within functional limits for tasks assessed  POSTURE: rounded shoulders, forward head, flexed trunk , and weight shift left  LOWER EXTREMITY ROM:  ROM P:passive  A:active Right eval Left eval  Hip flexion [facility]  Hip extension    Hip abduction    Hip adduction    Hip internal rotation    Hip external rotation    Knee flexion    Knee extension [facility]  Ankle dorsiflexion    Ankle plantarflexion    Ankle inversion    Ankle eversion     (Blank rows = not tested)  LOWER EXTREMITY MMT:  MMT Right eval Left eval  Hip flexion 5/5 5/5  Hip extension 4/5 4/5  Hip abduction 4/5 4/5  Hip adduction    Hip internal rotation    Hip external rotation    Knee flexion 4/5 4/5   Knee extension 4/5 4/5  Ankle dorsiflexion  4/5  Ankle plantarflexion    Ankle inversion    Ankle eversion    At Evaluation all strength testing is grossly seated and functionally standing / gait. (Blank rows = not tested)  TRANSFERS: Sit to stand: SBA 20" w/c to RW requires use of UEs on armrests Stand to sit: SBA RW to 20" w/c requires use of UEs on armrets  FUNCTIONAL TESTs:  Sharlene Motts Balance Scale: 16/56  [facility] PT Assessment - 11/28/22 1310       Standardized Balance Assessment   Standardized Balance Assessment Berg Balance Test      Berg Balance Test   Sit to Stand Needs minimal aid to stand or to stabilize    Standing Unsupported Able to stand 30 seconds unsupported    Sitting with Back Unsupported but Feet Supported on Floor or Stool Able to sit safely and securely 2 minutes    Stand to Sit Controls descent by using hands    Transfers Able to transfer safely, definite need of hands    Standing Unsupported with Eyes Closed Unable to keep eyes closed 3 seconds but stays steady    Standing Unsupported with Feet Together Needs help to attain position but able to stand for 30 seconds with feet together    From Standing, Reach Forward with Outstretched Arm Reaches forward but needs supervision    From Standing Position, Pick up Object from Floor Unable to try/needs assist to keep balance    From Standing Position, Turn to Look Behind Over each Shoulder Needs assist to keep from losing balance and falling    Turn 360 Degrees Needs assistance while turning    Standing Unsupported, Alternately Place Feet on Step/Stool Needs assistance to keep from falling or unable to try    Standing Unsupported, One Foot in [employer] balance while stepping or standing    Standing on One Leg Unable to try or needs assist to prevent fall    Total Score 16    Berg comment: BERG  < 36 high risk for falls (close to 100%) 46-51 moderate (>50%)   37-45 significant (>80%) 52-55 lower (> 25%)               GAIT: Gait pattern: step through pattern, decreased step length- Left, decreased stance time- Right, decreased hip/knee flexion- Right, Right hip hike, knee flexed in stance- Right, antalgic, lateral hip instability, trunk flexed, and abducted- Right Distance walked: 40' Assistive device utilized: Environmental consultant - 2 wheeled and TTA prosthesis Level of assistance: CGA Comments: excessive BUE weight bearing on RW   CURRENT PROSTHETIC WEAR ASSESSMENT: 11/28/2022:  Patient is dependent with: skin check, residual limb care, prosthetic cleaning, ply sock cleaning, correct ply sock adjustment, proper wear  schedule/adjustment, and proper weight-bearing schedule/adjustment Donning prosthesis: SBA Sinda Du cues Doffing prosthesis: SBA / verbal cues Prosthetic wear tolerance: up to 4 hours, 1x/day, 8 of 11 days since delivery Prosthetic weight bearing tolerance: 5 minutes with no reports of limb pain or discomfort Edema: pitting with 5 sec capillary refill Residual limb condition: no open areas, oily skin from lotion use, no hair growth, normal color & temperature, cylindrical shape Prosthetic description: silicon liner with pin lock suspension, total contact socket, hydraulic ankle K code/activity level with prosthetic use: Level 3    TODAY'S TREATMENT:                                                                                                                             DATE:  11/28/2022: PT provided prosthetic care instructions noted below.  PATIENT EDUCATION: PATIENT EDUCATED ON FOLLOWING PROSTHETIC CARE: Education details: sitting positioning,  Skin check, Residual limb care, Prosthetic cleaning, Ply sock cleaning, Correct ply sock adjustment, Propper donning, and Proper wear schedule/adjustment Prosthetic wear tolerance: 4 hours 2x/day, 7 days/week. If no pain or skin issues in one week, then increase to 5 hrs 2x/day.  Person educated: Patient Education method: Explanation, Demonstration,  Tactile cues, and Verbal cues Education comprehension: verbalized understanding, verbal cues required, tactile cues required, and needs further education  HOME EXERCISE PROGRAM:  ASSESSMENT:  CLINICAL IMPRESSION: Patient is a 72 y.o. male who was seen today for physical therapy evaluation and treatment for prosthetic training with right Transtibial Amputation.  Patient is depended in prosthetic use and care increasing risk of skin issues.  Patient has limited wear which limits function throughout his day.  Berg balance score 16/56 indicates high risk of falls with dependency in standing ADLs.  Patient's prosthetic gait has deviations indicating high risk of falls with dependency on bilateral upper extremity support of rolling walker.  Patient would benefit from skilled physical therapy to improve safety and function with his prosthesis.  OBJECTIVE IMPAIRMENTS: Abnormal gait, cardiopulmonary status limiting activity, decreased activity tolerance, decreased balance, decreased endurance, decreased knowledge of condition, decreased knowledge of use of DME, decreased mobility, decreased strength, increased edema, postural dysfunction, and prosthetic dependency .   ACTIVITY LIMITATIONS: carrying, lifting, sitting, standing, stairs, transfers, reach over head, and locomotion level  PARTICIPATION LIMITATIONS: meal prep, cleaning, laundry, driving, community activity, and church  PERSONAL FACTORS: Fitness, Time since onset of injury/illness/exacerbation, and 3+ comorbidities: see PMH  are also affecting patient's functional outcome.   REHAB POTENTIAL: Good  CLINICAL DECISION MAKING: Evolving/moderate complexity  EVALUATION COMPLEXITY: Moderate   GOALS: Goals reviewed with patient? Yes  SHORT TERM GOALS: Target date: 12/28/2022  Patient donnes prosthesis modified independent & verbalizes proper cleaning. Baseline: SEE OBJECTIVE DATA Goal status: INITIAL 2.  Patient tolerates prosthesis >12 hrs  total /day without skin issues or limb pain. Baseline: SEE OBJECTIVE DATA Goal status: INITIAL  3.  Patient able to reach 7" and look over both shoulders without UE support with supervision.  Baseline: SEE OBJECTIVE DATA Goal status: INITIAL  4. Patient ambulates 31' with RW & prosthesis with supervision. Baseline: SEE OBJECTIVE DATA Goal status: INITIAL  5. Patient negotiates ramps & curbs with RW & prosthesis with supervision. Baseline: SEE OBJECTIVE DATA Goal status: INITIAL  LONG TERM GOALS: Target date: 02/23/2023  Patient demonstrates & verbalized understanding of prosthetic care to enable safe utilization of prosthesis. Baseline: SEE OBJECTIVE DATA Goal status: INITIAL  Patient tolerates prosthesis wear >90% of awake hours without skin or limb pain issues. Baseline: SEE OBJECTIVE DATA Goal status: INITIAL  Berg Balance >36/56 to indicate lower fall risk Baseline: SEE OBJECTIVE DATA Goal status: INITIAL  Patient ambulates >500' with prosthesis and cane or less independently Baseline: SEE OBJECTIVE DATA Goal status: INITIAL  Patient negotiates ramps, curbs & stairs with single rail with prosthesis and cane or less independently. Baseline: SEE OBJECTIVE DATA Goal status: INITIAL  Patient verbalizes & demonstrates understanding of how to properly use fitness equipment to return to gym. Baseline: SEE OBJECTIVE DATA Goal status: INITIAL  PLAN:  PT FREQUENCY: 2x/week  PT DURATION:  13 weeks / 90 days  PLANNED INTERVENTIONS: Therapeutic exercises, Therapeutic activity, Neuromuscular re-education, Balance training, Gait training, Patient/Family education, Self Care, Stair training, Vestibular training, Prosthetic training, DME instructions, Aquatic Therapy, Manual therapy, Re-evaluation, and physical performance testing  PLAN FOR NEXT SESSION: instruct in adjusting ply socks, review donning, prosthetic gait with RW  Referring diagnosis? Z89.511 (ICD-10-CM) - S/P BKA  (below knee amputation) unilateral, right Treatment diagnosis? (if different than referring diagnosis)                 P                        1.   Other abnormalities of gait and mobility       ICD-10-CM: R26.89         2.   Unsteadiness on feet       ICD-10-CM: R26.81         3.   Muscle weakness (generalized)       ICD-10-CM: M62.81         4.   History of falling       ICD-10-CM: Z91.81        5.   Impaired functional mobility, balance, gait, and endurance       ICD-10-CM: Z74.09        Close     What was this (referring dx) caused by? [x]  Surgery []  Fall []  Ongoing issue []  Arthritis []  Other: ____________  Laterality: [x]  Rt []  Lt []  Both  Check all possible CPT codes:  *CHOOSE 10 OR LESS*    [x]  29562 (Therapeutic Exercise)  []  92507 (SLP Treatment)  [x]  97112 (Neuro Re-ed)   []  92526 (Swallowing Treatment)   [x]  13086 (Gait Training)   []  [id number] (Cognitive Training, 1st 15 minutes) []  97140 (Manual Therapy)   []  97130 (Cognitive Training, each add'l 15 minutes)  [x]  97164 (Re-evaluation)                              []  Other, List CPT Code ____________  [x]  97530 (Therapeutic Activities)     [x]  97535 (Self Care)   []  All codes above (97110 - 97535)  []  97012 (Mechanical Traction)  []  97014 (E-stim Unattended)  []  97032 (E-stim manual)  []  97033 (Ionto)  []  97035 (Ultrasound) [x]  97750 (  Physical Performance Training) []  [id number] (Aquatic Therapy) []  97016 (Vasopneumatic Device) []  [id number] (Paraffin) []  31517 (Contrast Bath) []  [phone] (Wound Care 1st 20 sq cm) []  97598 (Wound Care each add'l 20 sq cm) []  97760 (Orthotic Fabrication, Fitting, Training Initial) [x]  [id number] (Prosthetic Management and Training Initial) []  [id number] (Orthotic or Prosthetic Training/ Modification Subsequent)   Vladimir Faster, PT, DPT 11/28/2022, 5:35 PM

## 2022-11-30 ENCOUNTER — Ambulatory Visit (INDEPENDENT_AMBULATORY_CARE_PROVIDER_SITE_OTHER): Payer: Medicare HMO | Admitting: Physical Therapy

## 2022-11-30 ENCOUNTER — Encounter: Payer: Self-pay | Admitting: Physical Therapy

## 2022-11-30 DIAGNOSIS — R2689 Other abnormalities of gait and mobility: Secondary | ICD-10-CM

## 2022-11-30 DIAGNOSIS — Z9181 History of falling: Secondary | ICD-10-CM

## 2022-11-30 DIAGNOSIS — R2681 Unsteadiness on feet: Secondary | ICD-10-CM | POA: Diagnosis not present

## 2022-11-30 DIAGNOSIS — M6281 Muscle weakness (generalized): Secondary | ICD-10-CM | POA: Diagnosis not present

## 2022-11-30 DIAGNOSIS — Z7409 Other reduced mobility: Secondary | ICD-10-CM

## 2022-11-30 NOTE — Therapy (Signed)
OUTPATIENT PHYSICAL THERAPY PROSTHETIC TREATMENT   Patient Name: Caleb [name] MRN: [id number] DOB:05/11/1950, 72 y.o., male Today's Date: 11/30/2022  END OF SESSION:  PT End of Session - 11/30/22 0849     Visit Number 2    Date for PT Re-Evaluation 02/23/23    Authorization Type [employer]    Authorization Time Period $25 COPAY  Approved  Authorization #[id number]  Tracking #[id number] pt visits 9/9/-12/5    Authorization - Visit Number 2    Authorization - Number of Visits 12    Progress Note Due on Visit 10    PT Start Time 0845    PT Stop Time 0930    PT Time Calculation (min) 45 min    Equipment Utilized During Treatment Gait belt    Activity Tolerance Patient tolerated treatment well;Patient limited by fatigue    Behavior During Therapy WFL for tasks assessed/performed              Past Medical History:  Diagnosis Date   Carotid artery occlusion    CHF (congestive heart failure) (HCC)    Diabetes mellitus without complication (HCC)    Type II   Dyslipidemia 09/27/2012   Erectile dysfunction 09/27/2012   GERD (gastroesophageal reflux disease)    Hypercholesteremia    Hyperlipidemia 08/12/2012   Hypertension    Hyponatremia 08/14/2012   MGUS (monoclonal gammopathy of unknown significance)    Neuropathy    Pancreatitis    Pancreatitis, acute 09/27/2012   Peripheral vascular disease (HCC)    S/P 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 Excell Seltzer & Bartle   Severe aortic stenosis    Vitamin D deficiency 06/23/2015   Past Surgical History:  Procedure Laterality Date   ABDOMINAL AORTOGRAM W/LOWER EXTREMITY N/A 08/08/2019   Procedure: ABDOMINAL AORTOGRAM W/LOWER EXTREMITY;  Surgeon: Runell Gess, MD;  Location: [facility];  Service: Cardiovascular;  Laterality: N/A;   ABDOMINAL AORTOGRAM W/LOWER EXTREMITY N/A 11/18/2019   Procedure: ABDOMINAL AORTOGRAM W/LOWER  EXTREMITY;  Surgeon: Runell Gess, MD;  Location: [facility];  Service: Cardiovascular;  Laterality: N/A;   ABDOMINAL AORTOGRAM W/LOWER EXTREMITY N/A 06/13/2022   Procedure: ABDOMINAL AORTOGRAM W/LOWER EXTREMITY;  Surgeon: Chuck Hint, MD;  Location: [facility] INVASIVE CV LAB;  Service: Cardiovascular;  Laterality: N/A;   AMPUTATION Right 08/19/2022   Procedure: RIGHT BELOW KNEE AMPUTATION;  Surgeon: Nadara Mustard, MD;  Location: [facility] OR;  Service: Orthopedics;  Laterality: Right;   APPLICATION OF WOUND VAC  06/24/2022   Procedure: APPLICATION OF WOUND VAC;  Surgeon: Nadara Mustard, MD;  Location: [facility] OR;  Service: Orthopedics;;   CARDIAC CATHETERIZATION     COLONOSCOPY  12 years ago   in [place] clinic= normal exam per pt   ENDARTERECTOMY FEMORAL Right 06/17/2022   Procedure: SUPERFICIAL ENDARTERECTOMY OF COMMON FEMORAL ARTERY;  Surgeon: Victorino Sparrow, MD;  Location: [facility] OR;  Service: Vascular;  Laterality: Right;   FEMORAL-TIBIAL BYPASS GRAFT Right 06/17/2022   Procedure: RIGHT FEMORAL- ANTERIOR TIBIAL ARTERY BYPASS;  Surgeon: Victorino Sparrow, MD;  Location: [facility] OR;  Service: Vascular;  Laterality: Right;   I & D EXTREMITY Right 06/24/2022   Procedure: RIGHT PARTIAL CALCANEUS EXCISION;  Surgeon: Nadara Mustard, MD;  Location: [facility] OR;  Service: Orthopedics;  Laterality: Right;   I & D EXTREMITY Right 07/29/2022   Procedure: RIGHT PARTIAL CALCANEUS EXCISION;  Surgeon:  Nadara Mustard, MD;  Location: [facility] OR;  Service: Orthopedics;  Laterality: Right;   INTRAOPERATIVE TRANSTHORACIC ECHOCARDIOGRAM N/A 01/11/2022   Procedure: INTRAOPERATIVE TRANSTHORACIC ECHOCARDIOGRAM;  Surgeon: Tonny Bollman, MD;  Location: [facility] INVASIVE CV LAB;  Service: Open Heart Surgery;  Laterality: N/A;   KNEE SURGERY     MULTIPLE EXTRACTIONS WITH ALVEOLOPLASTY N/A 12/09/2021   Procedure: MULTIPLE EXTRACTION;  Surgeon: Sharman Cheek, DMD;  Location: [facility] OR;  Service: Dentistry;  Laterality: N/A;   PERIPHERAL VASCULAR  INTERVENTION Right 08/08/2019   Procedure: PERIPHERAL VASCULAR INTERVENTION;  Surgeon: Runell Gess, MD;  Location: [facility];  Service: Cardiovascular;  Laterality: Right;   PERIPHERAL VASCULAR INTERVENTION Left 11/18/2019   Procedure: PERIPHERAL VASCULAR INTERVENTION;  Surgeon: Runell Gess, MD;  [facility]: [facility];  Service: Cardiovascular;  Laterality: Left;  left popliteal artery   RIGHT/LEFT HEART CATH AND CORONARY ANGIOGRAPHY N/A 10/29/2021   Procedure: RIGHT/LEFT HEART CATH AND CORONARY ANGIOGRAPHY;  Surgeon: Kathleene Hazel, MD;  Location: [facility];  Service: Cardiovascular;  Laterality: N/A;   TRANSCAROTID ARTERY REVASCULARIZATION  Left 12/16/2019   Procedure: TRANSCAROTID ARTERY REVASCULARIZATION;  Surgeon: Sherren Kerns, MD;  Location: [facility] OR;  Service: Vascular;  Laterality: Left;   TRANSCAROTID ARTERY REVASCULARIZATION  Right 02/03/2020   Procedure: RIGHT TRANSCAROTID ARTERY REVASCULARIZATION;  Surgeon: Sherren Kerns, MD;  Location: [facility] OR;  Service: Vascular;  Laterality: Right;   TRANSCATHETER AORTIC VALVE REPLACEMENT, TRANSFEMORAL N/A 01/11/2022   Procedure: Transcatheter Aortic Valve Replacement, Transfemoral;  Surgeon: Tonny Bollman, MD;  Location: [facility] INVASIVE CV LAB;  Service: Open Heart Surgery;  Laterality: N/A;   ULTRASOUND GUIDANCE FOR VASCULAR ACCESS Right 12/16/2019   Procedure: ULTRASOUND GUIDANCE FOR VASCULAR ACCESS;  Surgeon: Sherren Kerns, MD;  Location: [facility] OR;  Service: Vascular;  Laterality: Right;   ULTRASOUND GUIDANCE FOR VASCULAR ACCESS Right 02/03/2020   Procedure: ULTRASOUND GUIDANCE FOR VASCULAR ACCESS;  Surgeon: Sherren Kerns, MD;  Location: [facility] OR;  Service: Vascular;  Laterality: Right;   VASECTOMY     VEIN HARVEST Right 06/17/2022   Procedure: IPSILATERAL RIGHT GREATER SAPHENOUS VEIN HARVEST;  Surgeon: Victorino Sparrow, MD;  Location: [facility] OR;  Service: Vascular;  Laterality: Right;   Patient  Active Problem List   Diagnosis Date Noted   S/P BKA (below knee amputation) unilateral, right (HCC) 10/03/2022   Wheelchair dependence 10/03/2022   Unilateral complete BKA, right, initial encounter (HCC) 08/22/2022   Transaminitis 08/20/2022   Coronary artery disease involving native coronary artery of native heart with angina pectoris (HCC) 08/19/2022   Osteomyelitis of right lower extremity (HCC) 08/18/2022   Ganglion of right ankle and foot 08/12/2022   AKI (acute kidney injury) (HCC) 08/11/2022   Chronic osteomyelitis of left foot (HCC) 08/11/2022   Myoclonus 08/11/2022   Chronic osteomyelitis (HCC) 08/11/2022   Leukocytosis 08/03/2022   Anemia of chronic disease 08/03/2022   Heart failure with reduced ejection fraction (HCC) 08/03/2022   History of osteomyelitis 08/03/2022   Foot ulcer with necrosis of bone (HCC) 07/29/2022   Gastroenteritis 06/15/2022   Acute osteomyelitis of right calcaneus (HCC) 06/09/2022   Diabetic foot ulcer (HCC) 06/09/2022   SIRS (systemic inflammatory response syndrome) (HCC) 06/07/2022   Acute CHF (congestive heart failure) (HCC) 05/27/2022   Decubitus ulcer of right heel 05/27/2022   Nausea and vomiting 05/27/2022   Wild-type transthyretin-related (ATTR) amyloidosis (HCC) 04/26/2022   MGUS (monoclonal gammopathy of unknown significance) 02/15/2022   PAD (peripheral artery disease) (  HCC) 01/11/2022   Non-ischemic cardiomyopathy (HCC) 01/11/2022   Stage IIIa chronic kidney disease 01/11/2022   Severe aortic stenosis 01/11/2022   S/P TAVR (transcatheter aortic valve replacement) 01/11/2022   Caries 11/12/2021   Teeth missing 11/12/2021   Retained tooth root 11/12/2021   Chronic apical periodontitis 11/12/2021   Chronic periodontitis 11/12/2021   Accretions on teeth 11/12/2021   Encounter for management of wound VAC 11/12/2021   Phobia of dental procedure 11/12/2021   Defective dental restoration 11/12/2021   Attrition, teeth excessive 11/12/2021    Torus mandibularis 11/12/2021   Diastema of teeth 11/12/2021   Encounter for preoperative dental examination 11/09/2021   Acute kidney injury superimposed on chronic kidney disease (HCC) 10/30/2021   Chronic systolic CHF (congestive heart failure) (HCC) 10/28/2021   Hypertensive urgency 10/28/2021   DM2 (diabetes mellitus, type 2) (HCC) 07/01/2021   Burn of first degree of multiple sites of unspecified wrist and hand, initial encounter 12/31/2020   Male erectile disorder (CODE) 12/18/2020   Encounter for sterilization 12/18/2020   Diabetic neuropathy (HCC) 09/11/2020   Low back pain, unspecified 09/11/2020   Unspecified kidney failure 09/11/2020   Unsteadiness on feet 09/11/2020   Status post amputation of lesser toe of left foot (HCC) 07/22/2020   Hypertensive heart disease with chronic combined systolic and diastolic congestive heart failure (HCC) 07/22/2020   Carotid stenosis, asymptomatic 02/03/2020   Peripheral vascular disease (HCC)    Carotid artery stenosis 12/16/2019   Moderate aortic stenosis 12/02/2019   Critical ischemia of foot (HCC) 11/18/2019   Carotid artery disease (HCC) 08/23/2019   Critical limb ischemia with history of revascularization of same extremity (HCC) 08/02/2019   Diabetes mellitus without complication (HCC)    Cardiac murmur 02/22/2018   Vitamin D deficiency 06/23/2015   Pancreatitis, acute 09/27/2012   Dyslipidemia 09/27/2012   Erectile dysfunction 09/27/2012   Essential hypertension 09/27/2012   Obesity, unspecified 08/14/2012   Chronic hyponatremia 08/14/2012   Acute pancreatitis 08/12/2012   HTN (hypertension) 08/12/2012   Hyperlipidemia 08/12/2012   Metabolic acidosis 08/12/2012    PCP:  Hoy Register, MD  REFERRING PROVIDER: Barnie Del, NP  ONSET DATE: 11/17/2022 prosthesis delivery  REFERRING DIAG: Z89.511 (ICD-10-CM) - S/P BKA (below knee amputation) unilateral, right  THERAPY DIAG:  Other abnormalities of gait and  mobility  Unsteadiness on feet  Muscle weakness (generalized)  History of falling  Impaired functional mobility, balance, gait, and endurance  Rationale for Evaluation and Treatment: Rehabilitation  SUBJECTIVE:   SUBJECTIVE STATEMENT: He is able to get prosthesis on limb on first try. He is wearing prosthesis 4 hrs 2x/day with some distal tibia tenderness.   PERTINENT HISTORY: Right TTA 08/19/22, DM2, Aortic Valve Replacement 01/11/22, CAD, CKD st IIIa, LBP  PAIN:  Are you having pain? No  PRECAUTIONS: Fall  WEIGHT BEARING RESTRICTIONS: No  FALLS: Has patient fallen in last 6 months? Yes. Number of falls 2 once in hospital & 2nd within week of going home.  No issues.   LIVING ENVIRONMENT: Lives with: lives alone Lives in: apartment Home Access: threshold entry Home layout: One level Stairs: No Has following equipment at home: Environmental consultant - 2 wheeled, Wheelchair (manual), and Tour manager  OCCUPATION:  retired from [employer]  PLOF: Independent, Independent with household mobility without device, Independent with community mobility without device, and Leisure: swimming & walk  PATIENT GOALS:   to use prosthesis including walking for exercise, swim, travel mainly driving  OBJECTIVE:  COGNITION: Eval on 11/28/2022:  Overall  cognitive status: Within functional limits for tasks assessed  POSTURE: Eval on 11/28/2022:  rounded shoulders, forward head, flexed trunk , and weight shift left  LOWER EXTREMITY ROM:  ROM P:passive  A:active Right eval Left eval  Hip flexion [facility]  Hip extension    Hip abduction    Hip adduction    Hip internal rotation    Hip external rotation    Knee flexion    Knee extension [facility]  Ankle dorsiflexion    Ankle plantarflexion    Ankle inversion    Ankle eversion     (Blank rows = not tested)  LOWER EXTREMITY MMT:  MMT Right eval Left eval  Hip flexion 5/5 5/5  Hip extension 4/5 4/5  Hip abduction 4/5 4/5  Hip  adduction    Hip internal rotation    Hip external rotation    Knee flexion 4/5 4/5  Knee extension 4/5 4/5  Ankle dorsiflexion  4/5  Ankle plantarflexion    Ankle inversion    Ankle eversion    At Evaluation all strength testing is grossly seated and functionally standing / gait. (Blank rows = not tested)  TRANSFERS: Eval on 11/28/2022: Sit to stand: SBA 20" w/c to RW requires use of UEs on armrests Stand to sit: SBA RW to 20" w/c requires use of UEs on armrets  FUNCTIONAL TESTs:  Eval on 11/28/2022: Sharlene Motts Balance Scale: 16/56  GAIT: Eval on 11/28/2022: Gait pattern: step through pattern, decreased step length- Left, decreased stance time- Right, decreased hip/knee flexion- Right, Right hip hike, knee flexed in stance- Right, antalgic, lateral hip instability, trunk flexed, and abducted- Right Distance walked: 40' Assistive device utilized: Environmental consultant - 2 wheeled and TTA prosthesis Level of assistance: CGA Comments: excessive BUE weight bearing on RW   CURRENT PROSTHETIC WEAR ASSESSMENT: 11/28/2022:  Patient is dependent with: skin check, residual limb care, prosthetic cleaning, ply sock cleaning, correct ply sock adjustment, proper wear schedule/adjustment, and proper weight-bearing schedule/adjustment Donning prosthesis: SBA Sinda Du cues Doffing prosthesis: SBA / verbal cues Prosthetic wear tolerance: up to 4 hours, 1x/day, 8 of 11 days since delivery Prosthetic weight bearing tolerance: 5 minutes with no reports of limb pain or discomfort Edema: pitting with 5 sec capillary refill Residual limb condition: no open areas, oily skin from lotion use, no hair growth, normal color & temperature, cylindrical shape Prosthetic description: silicon liner with pin lock suspension, total contact socket, hydraulic ankle K code/activity level with prosthetic use: Level 3    TODAY'S TREATMENT:                                                                                                                              DATE:  11/30/2022: Prosthetic Training with Transtibial Amputation: PT reviewed donning with liner orientation (writing on top). Pt verbalized understanding. PT instructed pt in cleaning & donning socks with demo & verbal cues.  PT instructed with too few, too many and correct ply  socks by resistance donning, clicks seated donning, height in gait, pressure in gait and depth of patella in socket after walking.  Pt verbalized better understanding of sock mangagment.  Pt having distal tibia pain so PT instructed in hamstring set to engage hamstring for knee control. Hamstring set:  Seated with LE extended 10 reps, standing with knee ext and gait 25' Pt amb 25' X 5 during above with improved comfort and step through pattern.    TREATMENT:                                                                                                                             DATE:  11/28/2022: PT provided prosthetic care instructions noted below.  PATIENT EDUCATION: PATIENT EDUCATED ON FOLLOWING PROSTHETIC CARE: Education details: sitting positioning,  Skin check, Residual limb care, Prosthetic cleaning, Ply sock cleaning, Correct ply sock adjustment, Propper donning, and Proper wear schedule/adjustment Prosthetic wear tolerance: 4 hours 2x/day, 7 days/week. If no pain or skin issues in one week, then increase to 5 hrs 2x/day.  Person educated: Patient Education method: Explanation, Demonstration, Tactile cues, and Verbal cues Education comprehension: verbalized understanding, verbal cues required, tactile cues required, and needs further education  HOME EXERCISE PROGRAM:  ASSESSMENT:  CLINICAL IMPRESSION: Patient appears to have a better understanding of adjusting ply socks after PT instructions.  He also appears to understand hamstring sets to help control distal tibia pain.    OBJECTIVE IMPAIRMENTS: Abnormal gait, cardiopulmonary status limiting activity, decreased activity tolerance,  decreased balance, decreased endurance, decreased knowledge of condition, decreased knowledge of use of DME, decreased mobility, decreased strength, increased edema, postural dysfunction, and prosthetic dependency .   ACTIVITY LIMITATIONS: carrying, lifting, sitting, standing, stairs, transfers, reach over head, and locomotion level  PARTICIPATION LIMITATIONS: meal prep, cleaning, laundry, driving, community activity, and church  PERSONAL FACTORS: Fitness, Time since onset of injury/illness/exacerbation, and 3+ comorbidities: see PMH  are also affecting patient's functional outcome.   REHAB POTENTIAL: Good  CLINICAL DECISION MAKING: Evolving/moderate complexity  EVALUATION COMPLEXITY: Moderate   GOALS: Goals reviewed with patient? Yes  SHORT TERM GOALS: Target date: 12/28/2022  Patient donnes prosthesis modified independent & verbalizes proper cleaning. Baseline: SEE OBJECTIVE DATA Goal status: Ongoing 11/30/2022 2.  Patient tolerates prosthesis >12 hrs total /day without skin issues or limb pain. Baseline: SEE OBJECTIVE DATA Goal status: Ongoing 11/30/2022  3.  Patient able to reach 7" and look over both shoulders without UE support with supervision. Baseline: SEE OBJECTIVE DATA Goal status: Ongoing 11/30/2022  4. Patient ambulates 42' with RW & prosthesis with supervision. Baseline: SEE OBJECTIVE DATA Goal status: Ongoing 11/30/2022  5. Patient negotiates ramps & curbs with RW & prosthesis with supervision. Baseline: SEE OBJECTIVE DATA Goal status: Ongoing 11/30/2022  LONG TERM GOALS: Target date: 02/23/2023  Patient demonstrates & verbalized understanding of prosthetic care to enable safe utilization of prosthesis. Baseline: SEE OBJECTIVE DATA Goal status:   Ongoing 11/30/2022  Patient tolerates prosthesis wear >  90% of awake hours without skin or limb pain issues. Baseline: SEE OBJECTIVE DATA Goal status: Ongoing 11/30/2022  Berg Balance >36/56 to indicate lower fall  risk Baseline: SEE OBJECTIVE DATA Goal status: Ongoing 11/30/2022  Patient ambulates >500' with prosthesis and cane or less independently Baseline: SEE OBJECTIVE DATA Goal status: Ongoing 11/30/2022  Patient negotiates ramps, curbs & stairs with single rail with prosthesis and cane or less independently. Baseline: SEE OBJECTIVE DATA Goal status: Ongoing 11/30/2022  Patient verbalizes & demonstrates understanding of how to properly use fitness equipment to return to gym. Baseline: SEE OBJECTIVE DATA Goal status: Ongoing 11/30/2022  PLAN:  PT FREQUENCY: 2x/week  PT DURATION:  13 weeks / 90 days  PLANNED INTERVENTIONS: Therapeutic exercises, Therapeutic activity, Neuromuscular re-education, Balance training, Gait training, Patient/Family education, Self Care, Stair training, Vestibular training, Prosthetic training, DME instructions, Aquatic Therapy, Manual therapy, Re-evaluation, and physical performance testing  PLAN FOR NEXT SESSION:  prosthetic gait with RW including ramps & curbs, stairs step-to pattern with rails, balance activities    Vladimir Faster, PT, DPT 11/30/2022, 9:51 AM

## 2022-12-06 ENCOUNTER — Encounter: Payer: Medicare HMO | Admitting: Physical Therapy

## 2022-12-06 ENCOUNTER — Telehealth: Payer: Self-pay | Admitting: Physical Therapy

## 2022-12-06 NOTE — Telephone Encounter (Signed)
Pt did not show for PT appointment today. They were contacted and informed of this via voicemail. He states that he forgot about his appointment and I confirmed date and time of his next appointment for him and he says he will be at that one.  Ivery Quale, PT, DPT 12/06/22 8:21 AM

## 2022-12-12 ENCOUNTER — Encounter: Payer: Self-pay | Admitting: Physical Therapy

## 2022-12-12 ENCOUNTER — Ambulatory Visit (INDEPENDENT_AMBULATORY_CARE_PROVIDER_SITE_OTHER): Payer: Medicare HMO | Admitting: Physical Therapy

## 2022-12-12 DIAGNOSIS — R2681 Unsteadiness on feet: Secondary | ICD-10-CM | POA: Diagnosis not present

## 2022-12-12 DIAGNOSIS — R2689 Other abnormalities of gait and mobility: Secondary | ICD-10-CM | POA: Diagnosis not present

## 2022-12-12 DIAGNOSIS — M6281 Muscle weakness (generalized): Secondary | ICD-10-CM | POA: Diagnosis not present

## 2022-12-12 NOTE — Therapy (Addendum)
PHYSICAL THERAPY DISCHARGE SUMMARY  Visits from Start of Care: 3  Current functional level related to goals / functional outcomes: See below   Remaining deficits: See below   Education / Equipment: Patient was instructed in basic prosthetic care which he appears to understand.     Patient agrees to discharge. Patient goals were not met. Patient is being discharged due to the patient's request.  Patient called PT office requesting discharge as his schedule is too busy at this time.    Vladimir Faster, PT, DPT 12/21/2022, 1:58 PM   OUTPATIENT PHYSICAL THERAPY PROSTHETIC TREATMENT   Patient Name: Caleb [name] MRN: [id number] DOB:Jan 17, 1951, 72 y.o., male Today's Date: 12/12/2022  END OF SESSION:  PT End of Session - 12/12/22 1357     Visit Number 3    Date for PT Re-Evaluation 02/23/23    Authorization Type [employer]    Authorization Time Period $25 COPAY  Approved  Authorization #[id number]  Tracking #[id number] pt visits 9/9/-12/5    Authorization - Visit Number 3    Authorization - Number of Visits 12    Progress Note Due on Visit 10    PT Start Time 1300    PT Stop Time 1348    PT Time Calculation (min) 48 min    Equipment Utilized During Treatment Gait belt    Activity Tolerance Patient tolerated treatment well    Behavior During Therapy WFL for tasks assessed/performed               Past Medical History:  Diagnosis Date   Carotid artery occlusion    CHF (congestive heart failure) (HCC)    Diabetes mellitus without complication (HCC)    Type II   Dyslipidemia 09/27/2012   Erectile dysfunction 09/27/2012   GERD (gastroesophageal reflux disease)    Hypercholesteremia    Hyperlipidemia 08/12/2012   Hypertension    Hyponatremia 08/14/2012   MGUS (monoclonal gammopathy of unknown significance)    Neuropathy    Pancreatitis    Pancreatitis, acute 09/27/2012   Peripheral vascular disease (HCC)    S/P TAVR (transcatheter aortic valve replacement)  01/11/2022   s/p TAVR with a 26 mm Edwards S3UR via the TF approach by Dr. Excell Seltzer & Bartle   Severe aortic stenosis    Vitamin D deficiency 06/23/2015   Past Surgical History:  Procedure Laterality Date   ABDOMINAL AORTOGRAM W/LOWER EXTREMITY N/A 08/08/2019   Procedure: ABDOMINAL AORTOGRAM W/LOWER EXTREMITY;  Surgeon: Runell Gess, MD;  Location: [facility];  Service: Cardiovascular;  Laterality: N/A;   ABDOMINAL AORTOGRAM W/LOWER EXTREMITY N/A 11/18/2019   Procedure: ABDOMINAL AORTOGRAM W/LOWER EXTREMITY;  Surgeon: Runell Gess, MD;  Location: [facility];  Service: Cardiovascular;  Laterality: N/A;   ABDOMINAL AORTOGRAM W/LOWER EXTREMITY N/A 06/13/2022   Procedure: ABDOMINAL AORTOGRAM W/LOWER EXTREMITY;  Surgeon: Chuck Hint, MD;  Location: [facility] INVASIVE CV LAB;  Service: Cardiovascular;  Laterality: N/A;   AMPUTATION Right 08/19/2022   Procedure: RIGHT BELOW KNEE AMPUTATION;  Surgeon: Nadara Mustard, MD;  Location: [facility] OR;  Service: Orthopedics;  Laterality: Right;   APPLICATION OF WOUND VAC  06/24/2022   Procedure: APPLICATION OF WOUND VAC;  Surgeon: Nadara Mustard, MD;  Location: [facility] OR;  Service: Orthopedics;;   CARDIAC CATHETERIZATION     COLONOSCOPY  12 years ago   in [place] clinic= normal exam per pt   ENDARTERECTOMY FEMORAL Right 06/17/2022   Procedure: SUPERFICIAL ENDARTERECTOMY OF COMMON FEMORAL ARTERY;  Surgeon:  Victorino Sparrow, MD;  Location: [facility] OR;  Service: Vascular;  Laterality: Right;   FEMORAL-TIBIAL BYPASS GRAFT Right 06/17/2022   Procedure: RIGHT FEMORAL- ANTERIOR TIBIAL ARTERY BYPASS;  Surgeon: Victorino Sparrow, MD;  Location: [facility] OR;  Service: Vascular;  Laterality: Right;   I & D EXTREMITY Right 06/24/2022   Procedure: RIGHT PARTIAL CALCANEUS EXCISION;  Surgeon: Nadara Mustard, MD;  Location: [facility] OR;  Service: Orthopedics;  Laterality: Right;   I & D EXTREMITY Right 07/29/2022   Procedure: RIGHT PARTIAL CALCANEUS EXCISION;  Surgeon: Nadara Mustard,  MD;  Location: [facility] OR;  Service: Orthopedics;  Laterality: Right;   INTRAOPERATIVE TRANSTHORACIC ECHOCARDIOGRAM N/A 01/11/2022   Procedure: INTRAOPERATIVE TRANSTHORACIC ECHOCARDIOGRAM;  Surgeon: Tonny Bollman, MD;  Location: [facility] INVASIVE CV LAB;  Service: Open Heart Surgery;  Laterality: N/A;   KNEE SURGERY     MULTIPLE EXTRACTIONS WITH ALVEOLOPLASTY N/A 12/09/2021   Procedure: MULTIPLE EXTRACTION;  Surgeon: Sharman Cheek, DMD;  Location: [facility] OR;  Service: Dentistry;  Laterality: N/A;   PERIPHERAL VASCULAR INTERVENTION Right 08/08/2019   Procedure: PERIPHERAL VASCULAR INTERVENTION;  Surgeon: Runell Gess, MD;  [facility]: [facility];  Service: Cardiovascular;  Laterality: Right;   PERIPHERAL VASCULAR INTERVENTION Left 11/18/2019   Procedure: PERIPHERAL VASCULAR INTERVENTION;  Surgeon: Runell Gess, MD;  [facility];  Service: Cardiovascular;  Laterality: Left;  left popliteal artery   RIGHT/LEFT HEART CATH AND CORONARY ANGIOGRAPHY N/A 10/29/2021   Procedure: RIGHT/LEFT HEART CATH AND CORONARY ANGIOGRAPHY;  Surgeon: Kathleene Hazel, MD;  [facility]: [facility];  Service: Cardiovascular;  Laterality: N/A;   TRANSCAROTID ARTERY REVASCULARIZATION  Left 12/16/2019   Procedure: TRANSCAROTID ARTERY REVASCULARIZATION;  Surgeon: Sherren Kerns, MD;  Location: [facility] OR;  Service: Vascular;  Laterality: Left;   TRANSCAROTID ARTERY REVASCULARIZATION  Right 02/03/2020   Procedure: RIGHT TRANSCAROTID ARTERY REVASCULARIZATION;  Surgeon: Sherren Kerns, MD;  Location: [facility] OR;  Service: Vascular;  Laterality: Right;   TRANSCATHETER AORTIC VALVE REPLACEMENT, TRANSFEMORAL N/A 01/11/2022   Procedure: Transcatheter Aortic Valve Replacement, Transfemoral;  Surgeon: Tonny Bollman, MD;  Location: [facility] INVASIVE CV LAB;  Service: Open Heart Surgery;  Laterality: N/A;   ULTRASOUND GUIDANCE FOR VASCULAR ACCESS Right 12/16/2019   Procedure: ULTRASOUND GUIDANCE FOR VASCULAR  ACCESS;  Surgeon: Sherren Kerns, MD;  Location: [facility] OR;  Service: Vascular;  Laterality: Right;   ULTRASOUND GUIDANCE FOR VASCULAR ACCESS Right 02/03/2020   Procedure: ULTRASOUND GUIDANCE FOR VASCULAR ACCESS;  Surgeon: Sherren Kerns, MD;  Location: [facility] OR;  Service: Vascular;  Laterality: Right;   VASECTOMY     VEIN HARVEST Right 06/17/2022   Procedure: IPSILATERAL RIGHT GREATER SAPHENOUS VEIN HARVEST;  Surgeon: Victorino Sparrow, MD;  Location: [facility] OR;  Service: Vascular;  Laterality: Right;   Patient Active Problem List   Diagnosis Date Noted   S/P BKA (below knee amputation) unilateral, right (HCC) 10/03/2022   Wheelchair dependence 10/03/2022   Unilateral complete BKA, right, initial encounter (HCC) 08/22/2022   Transaminitis 08/20/2022   Coronary artery disease involving native coronary artery of native heart with angina pectoris (HCC) 08/19/2022   Osteomyelitis of right lower extremity (HCC) 08/18/2022   Ganglion of right ankle and foot 08/12/2022   AKI (acute kidney injury) (HCC) 08/11/2022   Chronic osteomyelitis of left foot (HCC) 08/11/2022   Myoclonus 08/11/2022   Chronic osteomyelitis (HCC) 08/11/2022   Leukocytosis 08/03/2022   Anemia of chronic disease 08/03/2022   Heart failure with reduced ejection  fraction (HCC) 08/03/2022   History of osteomyelitis 08/03/2022   Foot ulcer with necrosis of bone (HCC) 07/29/2022   Gastroenteritis 06/15/2022   Acute osteomyelitis of right calcaneus (HCC) 06/09/2022   Diabetic foot ulcer (HCC) 06/09/2022   SIRS (systemic inflammatory response syndrome) (HCC) 06/07/2022   Acute CHF (congestive heart failure) (HCC) 05/27/2022   Decubitus ulcer of right heel 05/27/2022   Nausea and vomiting 05/27/2022   Wild-type transthyretin-related (ATTR) amyloidosis (HCC) 04/26/2022   MGUS (monoclonal gammopathy of unknown significance) 02/15/2022   PAD (peripheral artery disease) (HCC) 01/11/2022   Non-ischemic cardiomyopathy (HCC) 01/11/2022    Stage IIIa chronic kidney disease 01/11/2022   Severe aortic stenosis 01/11/2022   S/P TAVR (transcatheter aortic valve replacement) 01/11/2022   Caries 11/12/2021   Teeth missing 11/12/2021   Retained tooth root 11/12/2021   Chronic apical periodontitis 11/12/2021   Chronic periodontitis 11/12/2021   Accretions on teeth 11/12/2021   Encounter for management of wound VAC 11/12/2021   Phobia of dental procedure 11/12/2021   Defective dental restoration 11/12/2021   Attrition, teeth excessive 11/12/2021   Torus mandibularis 11/12/2021   Diastema of teeth 11/12/2021   Encounter for preoperative dental examination 11/09/2021   Acute kidney injury superimposed on chronic kidney disease (HCC) 10/30/2021   Chronic systolic CHF (congestive heart failure) (HCC) 10/28/2021   Hypertensive urgency 10/28/2021   DM2 (diabetes mellitus, type 2) (HCC) 07/01/2021   Burn of first degree of multiple sites of unspecified wrist and hand, initial encounter 12/31/2020   Male erectile disorder (CODE) 12/18/2020   Encounter for sterilization 12/18/2020   Diabetic neuropathy (HCC) 09/11/2020   Low back pain, unspecified 09/11/2020   Unspecified kidney failure 09/11/2020   Unsteadiness on feet 09/11/2020   Status post amputation of lesser toe of left foot (HCC) 07/22/2020   Hypertensive heart disease with chronic combined systolic and diastolic congestive heart failure (HCC) 07/22/2020   Carotid stenosis, asymptomatic 02/03/2020   Peripheral vascular disease (HCC)    Carotid artery stenosis 12/16/2019   Moderate aortic stenosis 12/02/2019   Critical ischemia of foot (HCC) 11/18/2019   Carotid artery disease (HCC) 08/23/2019   Critical limb ischemia with history of revascularization of same extremity (HCC) 08/02/2019   Diabetes mellitus without complication (HCC)    Cardiac murmur 02/22/2018   Vitamin D deficiency 06/23/2015   Pancreatitis, acute 09/27/2012   Dyslipidemia 09/27/2012   Erectile  dysfunction 09/27/2012   Essential hypertension 09/27/2012   Obesity, unspecified 08/14/2012   Chronic hyponatremia 08/14/2012   Acute pancreatitis 08/12/2012   HTN (hypertension) 08/12/2012   Hyperlipidemia 08/12/2012   Metabolic acidosis 08/12/2012    PCP:  Hoy Register, MD  REFERRING PROVIDER: Barnie Del, NP  ONSET DATE: 11/17/2022 prosthesis delivery  REFERRING DIAG: Z89.511 (ICD-10-CM) - S/P BKA (below knee amputation) unilateral, right  THERAPY DIAG:  Other abnormalities of gait and mobility  Unsteadiness on feet  Muscle weakness (generalized)  Rationale for Evaluation and Treatment: Rehabilitation  SUBJECTIVE:   SUBJECTIVE STATEMENT: He denies pain upon arrival he is wearing prosthesis 11 hours a day now without any issues  PERTINENT HISTORY: Right TTA 08/19/22, DM2, Aortic Valve Replacement 01/11/22, CAD, CKD st IIIa, LBP  PAIN:  Are you having pain? No  PRECAUTIONS: Fall  WEIGHT BEARING RESTRICTIONS: No  FALLS: Has patient fallen in last 6 months? Yes. Number of falls 2 once in hospital & 2nd within week of going home.  No issues.   LIVING ENVIRONMENT: Lives with: lives alone Lives in: apartment Home Access:  threshold entry Home layout: One level Stairs: No Has following equipment at home: Dan Humphreys - 2 wheeled, Wheelchair (manual), and Tour manager  OCCUPATION:  retired from [employer]  PLOF: Independent, Independent with household mobility without device, Independent with community mobility without device, and Leisure: swimming & walk  PATIENT GOALS:   to use prosthesis including walking for exercise, swim, travel mainly driving  OBJECTIVE:  COGNITION: Eval on 11/28/2022:  Overall cognitive status: Within functional limits for tasks assessed  POSTURE: Eval on 11/28/2022:  rounded shoulders, forward head, flexed trunk , and weight shift left  LOWER EXTREMITY ROM:  ROM P:passive  A:active Right eval Left eval  Hip flexion [facility]   Hip extension    Hip abduction    Hip adduction    Hip internal rotation    Hip external rotation    Knee flexion    Knee extension [facility]  Ankle dorsiflexion    Ankle plantarflexion    Ankle inversion    Ankle eversion     (Blank rows = not tested)  LOWER EXTREMITY MMT:  MMT Right eval Left eval  Hip flexion 5/5 5/5  Hip extension 4/5 4/5  Hip abduction 4/5 4/5  Hip adduction    Hip internal rotation    Hip external rotation    Knee flexion 4/5 4/5  Knee extension 4/5 4/5  Ankle dorsiflexion  4/5  Ankle plantarflexion    Ankle inversion    Ankle eversion    At Evaluation all strength testing is grossly seated and functionally standing / gait. (Blank rows = not tested)  TRANSFERS: Eval on 11/28/2022: Sit to stand: SBA 20" w/c to RW requires use of UEs on armrests Stand to sit: SBA RW to 20" w/c requires use of UEs on armrets  FUNCTIONAL TESTs:  Eval on 11/28/2022: Sharlene Motts Balance Scale: 16/56  GAIT: Eval on 11/28/2022: Gait pattern: step through pattern, decreased step length- Left, decreased stance time- Right, decreased hip/knee flexion- Right, Right hip hike, knee flexed in stance- Right, antalgic, lateral hip instability, trunk flexed, and abducted- Right Distance walked: 40' Assistive device utilized: Environmental consultant - 2 wheeled and TTA prosthesis Level of assistance: CGA Comments: excessive BUE weight bearing on RW   CURRENT PROSTHETIC WEAR ASSESSMENT: 11/28/2022:  Patient is dependent with: skin check, residual limb care, prosthetic cleaning, ply sock cleaning, correct ply sock adjustment, proper wear schedule/adjustment, and proper weight-bearing schedule/adjustment Donning prosthesis: SBA Sinda Du cues Doffing prosthesis: SBA / verbal cues Prosthetic wear tolerance: up to 4 hours, 1x/day, 8 of 11 days since delivery Prosthetic weight bearing tolerance: 5 minutes with no reports of limb pain or discomfort Edema: pitting with 5 sec capillary refill Residual limb  condition: no open areas, oily skin from lotion use, no hair growth, normal color & temperature, cylindrical shape Prosthetic description: silicon liner with pin lock suspension, total contact socket, hydraulic ankle K code/activity level with prosthetic use: Level 3    TODAY'S TREATMENT:  DATE:  12/06/2022: Prosthetic Training with Transtibial Amputation: Reviewed when to add and subtract ply socks, it appeared he had too few socks on causing some external rotation of socket and he did not have any more socks with him so recommended he try to bring socks with him to add and subtract as needed. Reviewed how to ascend and descend stairs and had him ambulate up/down 1/2 flight of stairs in hallway using one rail and step to patten leading with sound limb up and leading with prosthesis down, good return demo and verbalizes understanding  Therex: Seated hamstring curl machine 25# 2X10 DL, 40# [id number] Rt leg only, 10# Rt leg only Seated knee extension machine 10# DL [id number], then 5# eccentrics up with both down with Rt only 2X10 Leg press machine 100# DL [id number],    09/08/3084: Prosthetic Training with Transtibial Amputation: PT reviewed donning with liner orientation (writing on top). Pt verbalized understanding. PT instructed pt in cleaning & donning socks with demo & verbal cues.  PT instructed with too few, too many and correct ply socks by resistance donning, clicks seated donning, height in gait, pressure in gait and depth of patella in socket after walking.  Pt verbalized better understanding of sock mangagment.  Pt having distal tibia pain so PT instructed in hamstring set to engage hamstring for knee control. Hamstring set:  Seated with LE extended 10 reps, standing with knee ext and gait 25' Pt amb 25' X 5 during above with improved comfort and step through pattern.     TREATMENT:                                                                                                                             DATE:  11/28/2022: PT provided prosthetic care instructions noted below.  PATIENT EDUCATION: PATIENT EDUCATED ON FOLLOWING PROSTHETIC CARE: Education details: sitting positioning,  Skin check, Residual limb care, Prosthetic cleaning, Ply sock cleaning, Correct ply sock adjustment, Propper donning, and Proper wear schedule/adjustment Prosthetic wear tolerance: 4 hours 2x/day, 7 days/week. If no pain or skin issues in one week, then increase to 5 hrs 2x/day.  Person educated: Patient Education method: Explanation, Demonstration, Tactile cues, and Verbal cues Education comprehension: verbalized understanding, verbal cues required, tactile cues required, and needs further education  HOME EXERCISE PROGRAM: Access Code: [facility] URL: [email or web address] Date: 12/12/2022 Prepared by: [employer] program including bike and treadmill that he has already began so did not need print out or info on them Exercises - Full Leg Press  - 2 x daily - 2-3 x weekly - 2-3 sets - 10-20 reps - Knee Extension with Weight Machine  - 2 x daily - 2-3 x weekly - 2-3 sets - 10-20 reps - Hamstring Curl with Weight Machine  - 2 x daily - 2-3 x weekly - 2-3 sets - 10-20 reps - Hip Adduction Machine  - 2 x daily - 2-3 x weekly - 3 sets - 10-20 reps  ASSESSMENT:  CLINICAL IMPRESSION: He arrives with prosthesis externally rotated some. We removed and realigned and it appears he would need to add some ply sock which he did not have so did recommend he brings socks so he can adjust PRN. He wants to get back in the gym and has began bike and treadmill so I did show him other weight machines that he can do to further strengthen and improve with PT. He showed good understanding of these and I did print him out these with reps, sets, and ballpark weight to use on them based on  our machines today however he understands that the weight may need to be adjusted due to different machines he would be using at gym. We also reviewed stairs today and he was able to return demonstrate with good technique.   OBJECTIVE IMPAIRMENTS: Abnormal gait, cardiopulmonary status limiting activity, decreased activity tolerance, decreased balance, decreased endurance, decreased knowledge of condition, decreased knowledge of use of DME, decreased mobility, decreased strength, increased edema, postural dysfunction, and prosthetic dependency .   ACTIVITY LIMITATIONS: carrying, lifting, sitting, standing, stairs, transfers, reach over head, and locomotion level  PARTICIPATION LIMITATIONS: meal prep, cleaning, laundry, driving, community activity, and church  PERSONAL FACTORS: Fitness, Time since onset of injury/illness/exacerbation, and 3+ comorbidities: see PMH  are also affecting patient's functional outcome.   REHAB POTENTIAL: Good  CLINICAL DECISION MAKING: Evolving/moderate complexity  EVALUATION COMPLEXITY: Moderate   GOALS: Goals reviewed with patient? Yes  SHORT TERM GOALS: Target date: 12/28/2022  Patient donnes prosthesis modified independent & verbalizes proper cleaning. Baseline: SEE OBJECTIVE DATA Goal status: Ongoing 11/30/2022 2.  Patient tolerates prosthesis >12 hrs total /day without skin issues or limb pain. Baseline: SEE OBJECTIVE DATA Goal status: Ongoing 11/30/2022  3.  Patient able to reach 7" and look over both shoulders without UE support with supervision. Baseline: SEE OBJECTIVE DATA Goal status: Ongoing 11/30/2022  4. Patient ambulates 84' with RW & prosthesis with supervision. Baseline: SEE OBJECTIVE DATA Goal status: Ongoing 11/30/2022  5. Patient negotiates ramps & curbs with RW & prosthesis with supervision. Baseline: SEE OBJECTIVE DATA Goal status: Ongoing 11/30/2022  LONG TERM GOALS: Target date: 02/23/2023  Patient demonstrates & verbalized  understanding of prosthetic care to enable safe utilization of prosthesis. Baseline: SEE OBJECTIVE DATA Goal status:   Ongoing 11/30/2022  Patient tolerates prosthesis wear >90% of awake hours without skin or limb pain issues. Baseline: SEE OBJECTIVE DATA Goal status: Ongoing 11/30/2022  Berg Balance >36/56 to indicate lower fall risk Baseline: SEE OBJECTIVE DATA Goal status: Ongoing 11/30/2022  Patient ambulates >500' with prosthesis and cane or less independently Baseline: SEE OBJECTIVE DATA Goal status: Ongoing 11/30/2022  Patient negotiates ramps, curbs & stairs with single rail with prosthesis and cane or less independently. Baseline: SEE OBJECTIVE DATA Goal status: Ongoing 11/30/2022  Patient verbalizes & demonstrates understanding of how to properly use fitness equipment to return to gym. Baseline: SEE OBJECTIVE DATA Goal status: Ongoing 11/30/2022  PLAN:  PT FREQUENCY: 2x/week  PT DURATION:  13 weeks / 90 days  PLANNED INTERVENTIONS: Therapeutic exercises, Therapeutic activity, Neuromuscular re-education, Balance training, Gait training, Patient/Family education, Self Care, Stair training, Vestibular training, Prosthetic training, DME instructions, Aquatic Therapy, Manual therapy, Re-evaluation, and physical performance testing  PLAN FOR NEXT SESSION:  how is gym program going and adjust accordingly based on soreness. prosthetic gait with RW including ramps & curbs, stairs step-to pattern with rails, balance activities    April Manson, PT, DPT 12/12/2022, 1:58 PM

## 2022-12-13 ENCOUNTER — Ambulatory Visit: Payer: Self-pay

## 2022-12-13 NOTE — Patient Outreach (Signed)
Care Coordination   12/13/2022 Name: Caleb [name] MRN: [id number] DOB: 1950-10-10   Care Coordination Outreach Attempts:  An unsuccessful telephone outreach was attempted for a scheduled appointment today.  Follow Up Plan:  Additional outreach attempts will be made to offer the patient care coordination information and services.   Encounter Outcome:  No Answer   Care Coordination Interventions:  No, not indicated    Delsa Sale RN BSN CCM [place]  [facility], [facility] Nurse Care Coordinator  Direct Dial: [phone] Website: [email or web address]

## 2022-12-14 ENCOUNTER — Encounter: Payer: Medicare HMO | Admitting: Physical Therapy

## 2022-12-19 ENCOUNTER — Encounter: Payer: Medicare HMO | Admitting: Physical Therapy

## 2022-12-20 ENCOUNTER — Encounter: Payer: Self-pay | Admitting: Licensed Clinical Social Worker

## 2022-12-20 ENCOUNTER — Other Ambulatory Visit: Payer: Self-pay | Admitting: Physician Assistant

## 2022-12-20 ENCOUNTER — Telehealth: Payer: Self-pay | Admitting: *Deleted

## 2022-12-20 NOTE — Progress Notes (Signed)
Care Coordination Note  12/20/2022 Name: Caleb [name] MRN: [id number] DOB: 16-Mar-1951  Caleb [name] is a 72 y.o. year old male who is a primary care patient of Hoy Register, MD and is actively engaged with the care management team. I reached out to Caleb [name] by phone today to assist with scheduling a follow up visit with the RN Case Manager  Follow up plan: Telephone appointment with care management team member scheduled for:01/04/23  [facility] Coordination Care Guide  Direct Dial: [phone]

## 2022-12-20 NOTE — Progress Notes (Signed)
Care Coordination Note  12/20/2022 Name: [name] MRN: [id number] DOB: [birth year]  Caleb [name] is a 72 y.o. year old male who is a primary care patient of Hoy Register, MD and is actively engaged with the care management team. I reached out to Caleb [name] by phone today to assist with re-scheduling a follow up visit with the RN Case Manager  Follow up plan: Unsuccessful telephone outreach attempt made. A HIPAA compliant phone message was left for the patient providing contact information and requesting a return call.   [facility]  Care Coordination Care Guide  Direct Dial: [phone]

## 2022-12-21 ENCOUNTER — Encounter: Payer: Medicare HMO | Admitting: Physical Therapy

## 2022-12-21 NOTE — Patient Outreach (Signed)
Care Coordination   Collaboration  Visit Note   12/20/2022 Name: Caleb [name] MRN: [id number] DOB: 1950-12-05  Caleb [name] is a 72 y.o. year old male who sees Caleb Register, MD for primary care. I  engaged with Care Guide  What matters to the patients health and wellness today?  Care Coordination    SDOH assessments and interventions completed:  No     Care Coordination Interventions:  Yes, provided  Interventions Today    Flowsheet Row Most Recent Value  General Interventions   General Interventions Discussed/Reviewed Communication with  Communication with --  [Collaborated with care guide regarding referral and patient needs]       Follow up plan:  Care guide will schedule initial with Caleb [name]    Encounter Outcome:  Patient Scheduled   Caleb [name], MSW, Caleb [name] [facility] Care Management [facility]  [facility] [email or web address] Phone [phone] 5:39 PM

## 2022-12-26 ENCOUNTER — Encounter: Payer: Medicare HMO | Admitting: Physical Therapy

## 2022-12-28 ENCOUNTER — Encounter: Payer: Medicare HMO | Admitting: Physical Therapy

## 2022-12-30 ENCOUNTER — Encounter: Payer: Self-pay | Admitting: Internal Medicine

## 2022-12-30 ENCOUNTER — Ambulatory Visit: Payer: Medicare HMO | Admitting: Internal Medicine

## 2022-12-30 VITALS — BP 132/80 | HR 59 | Ht 67.0 in | Wt 178.0 lb

## 2022-12-30 DIAGNOSIS — Z794 Long term (current) use of insulin: Secondary | ICD-10-CM

## 2022-12-30 DIAGNOSIS — E1159 Type 2 diabetes mellitus with other circulatory complications: Secondary | ICD-10-CM

## 2022-12-30 DIAGNOSIS — E1169 Type 2 diabetes mellitus with other specified complication: Secondary | ICD-10-CM | POA: Diagnosis not present

## 2022-12-30 DIAGNOSIS — E1165 Type 2 diabetes mellitus with hyperglycemia: Secondary | ICD-10-CM

## 2022-12-30 DIAGNOSIS — Z89511 Acquired absence of right leg below knee: Secondary | ICD-10-CM

## 2022-12-30 LAB — POCT GLYCOSYLATED HEMOGLOBIN (HGB A1C): Hemoglobin A1C: 6.8 % — AB (ref 4.0–5.6)

## 2022-12-30 NOTE — Patient Instructions (Signed)
-   Continue Synjardy 25-1000 mg , 1 tablet daily  - Change  Novolog Mix 5 units before breakfast and Supper     HOW TO TREAT LOW BLOOD SUGARS (Blood sugar LESS THAN 70 MG/DL) Please follow the RULE OF 15 for the treatment of hypoglycemia treatment (when your (blood sugars are less than 70 mg/dL)   STEP 1: Take 15 grams of carbohydrates when your blood sugar is low, which includes:  3-4 GLUCOSE TABS  OR 3-4 OZ OF JUICE OR REGULAR SODA OR ONE TUBE OF GLUCOSE GEL    STEP 2: RECHECK blood sugar in 15 MINUTES STEP 3: If your blood sugar is still low at the 15 minute recheck --> then, go back to STEP 1 and treat AGAIN with another 15 grams of carbohydrates.

## 2022-12-30 NOTE — Progress Notes (Signed)
Name: Caleb [name]  MRN/ DOB: [id number], 1950-12-15   Age/ Sex: 72 y.o., male    PCP: Hoy Register, MD   Reason for Endocrinology Evaluation: Type 2 Diabetes Mellitus     Date of Initial Endocrinology Visit: 06/30/2021    PATIENT IDENTIFIER: Mr. CAIDENCE FENELON is a 72 y.o. male with a past medical history of HTN, PVD, T2DM, carotid artery disease,Hx acute pancreatitis (2014), Right BKA and dyslipidemia. The patient presented for initial endocrinology clinic visit on 06/30/2021 for consultative assistance with his diabetes management.    HPI: Mr. Beckius was    Diagnosed with DM years ago Prior Medications tried/Intolerance: has been on insulin for a while  Hemoglobin A1c has ranged from 7.6% in 2020, peaking at 8.2% in 2022.  [facility]   On his initial visit I have attempted to switch Synjardy to    SUBJECTIVE:   During the last visit (04/28/2022): A1c 7.9%     Today (12/30/22): [name] is here for a follow up on diabetes management. He checks his blood sugars multiple times daily through freestyle libre .    He is s/p right below-knee amputation secondary to osteomyelitis 08/2022, healed well   He presented to the ED with AKI  due to nausea and vomiting multiple times over the past year  [facility] significance He is moving to [place]   Felt congested today, took Furosemide  Denies nausea or   vomiting  Denies constipation or diarrhea    HOME DIABETES REGIMEN: Synjardy 25-1000 mg , 1 tab daily  NovoLog mix 5  units BID      Statin: Yes ACE-I/ARB: No     CONTINUOUS GLUCOSE MONITORING RECORD INTERPRETATION : Unable to download      DIABETIC COMPLICATIONS: Microvascular complications:  Peripheral neuropathy, Right BKA Denies: CKD Last eye exam: Completed 05/2021  Macrovascular complications:  PVD Denies: CAD, CVA   PAST HISTORY: Past Medical History:  Past Medical  History:  Diagnosis Date   Carotid artery occlusion    CHF (congestive heart failure) (HCC)    Diabetes mellitus without complication (HCC)    Type II   Dyslipidemia 09/27/2012   Erectile dysfunction 09/27/2012   GERD (gastroesophageal reflux disease)    Hypercholesteremia    Hyperlipidemia 08/12/2012   Hypertension    Hyponatremia 08/14/2012   MGUS (monoclonal gammopathy of unknown significance)    Neuropathy    Pancreatitis    Pancreatitis, acute 09/27/2012   Peripheral vascular disease (HCC)    S/P 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 Excell Seltzer & Bartle   Severe aortic stenosis    Vitamin D deficiency 06/23/2015   Past Surgical History:  Past Surgical History:  Procedure Laterality Date   ABDOMINAL AORTOGRAM W/LOWER EXTREMITY N/A 08/08/2019   Procedure: ABDOMINAL AORTOGRAM W/LOWER EXTREMITY;  Surgeon: Runell Gess, MD;  [facility];  Service: Cardiovascular;  Laterality: N/A;   ABDOMINAL AORTOGRAM W/LOWER EXTREMITY N/A 11/18/2019   Procedure: ABDOMINAL AORTOGRAM W/LOWER EXTREMITY;  Surgeon: Runell Gess, MD;  [facility]: [facility];  Service: Cardiovascular;  Laterality: N/A;   ABDOMINAL AORTOGRAM W/LOWER EXTREMITY N/A 06/13/2022   Procedure: ABDOMINAL AORTOGRAM W/LOWER EXTREMITY;  Surgeon: Chuck Hint, MD;  Location: [facility] INVASIVE CV LAB;  Service: Cardiovascular;  Laterality: N/A;   AMPUTATION Right 08/19/2022   Procedure:  RIGHT BELOW KNEE AMPUTATION;  Surgeon: Nadara Mustard, MD;  Location: [facility] OR;  Service: Orthopedics;  Laterality: Right;   APPLICATION OF WOUND VAC  06/24/2022   Procedure: APPLICATION OF WOUND VAC;  Surgeon: Nadara Mustard, MD;  Location: [facility] OR;  Service: Orthopedics;;   CARDIAC CATHETERIZATION     COLONOSCOPY  12 years ago   in [place] clinic= normal exam per pt   ENDARTERECTOMY FEMORAL Right 06/17/2022   Procedure: SUPERFICIAL  ENDARTERECTOMY OF COMMON FEMORAL ARTERY;  Surgeon: Victorino Sparrow, MD;  Location: [facility] OR;  Service: Vascular;  Laterality: Right;   FEMORAL-TIBIAL BYPASS GRAFT Right 06/17/2022   Procedure: RIGHT FEMORAL- ANTERIOR TIBIAL ARTERY BYPASS;  Surgeon: Victorino Sparrow, MD;  Location: [facility] OR;  Service: Vascular;  Laterality: Right;   I & D EXTREMITY Right 06/24/2022   Procedure: RIGHT PARTIAL CALCANEUS EXCISION;  Surgeon: Nadara Mustard, MD;  [facility];  Service: Orthopedics;  Laterality: Right;   I & D EXTREMITY Right 07/29/2022   Procedure: RIGHT PARTIAL CALCANEUS EXCISION;  Surgeon: Nadara Mustard, MD;  Location: [facility] OR;  Service: Orthopedics;  Laterality: Right;   INTRAOPERATIVE TRANSTHORACIC ECHOCARDIOGRAM N/A 01/11/2022   Procedure: INTRAOPERATIVE TRANSTHORACIC ECHOCARDIOGRAM;  Surgeon: Tonny Bollman, MD;  Location: [facility] INVASIVE CV LAB;  Service: Open Heart Surgery;  Laterality: N/A;   KNEE SURGERY     MULTIPLE EXTRACTIONS WITH ALVEOLOPLASTY N/A 12/09/2021   Procedure: MULTIPLE EXTRACTION;  Surgeon: Sharman Cheek, DMD;  [facility];  Service: Dentistry;  Laterality: N/A;   PERIPHERAL VASCULAR INTERVENTION Right 08/08/2019   Procedure: PERIPHERAL VASCULAR INTERVENTION;  Surgeon: Runell Gess, MD;  [facility];  Service: Cardiovascular;  Laterality: Right;   PERIPHERAL VASCULAR INTERVENTION Left 11/18/2019   Procedure: PERIPHERAL VASCULAR INTERVENTION;  Surgeon: Runell Gess, MD;  [facility];  Service: Cardiovascular;  Laterality: Left;  left popliteal artery   RIGHT/LEFT HEART CATH AND CORONARY ANGIOGRAPHY N/A 10/29/2021   Procedure: RIGHT/LEFT HEART CATH AND CORONARY ANGIOGRAPHY;  Surgeon: Kathleene Hazel, MD;  [facility];  Service: Cardiovascular;  Laterality: N/A;   TRANSCAROTID ARTERY REVASCULARIZATION  Left 12/16/2019   Procedure: TRANSCAROTID ARTERY REVASCULARIZATION;  Surgeon: Sherren Kerns, MD;  Location: [facility]  OR;  Service: Vascular;  Laterality: Left;   TRANSCAROTID ARTERY REVASCULARIZATION  Right 02/03/2020   Procedure: RIGHT TRANSCAROTID ARTERY REVASCULARIZATION;  Surgeon: Sherren Kerns, MD;  Location: [facility] OR;  Service: Vascular;  Laterality: Right;   TRANSCATHETER AORTIC VALVE REPLACEMENT, TRANSFEMORAL N/A 01/11/2022   Procedure: Transcatheter Aortic Valve Replacement, Transfemoral;  Surgeon: Tonny Bollman, MD;  Location: [facility] INVASIVE CV LAB;  Service: Open Heart Surgery;  Laterality: N/A;   ULTRASOUND GUIDANCE FOR VASCULAR ACCESS Right 12/16/2019   Procedure: ULTRASOUND GUIDANCE FOR VASCULAR ACCESS;  Surgeon: Sherren Kerns, MD;  Location: [facility] OR;  Service: Vascular;  Laterality: Right;   ULTRASOUND GUIDANCE FOR VASCULAR ACCESS Right 02/03/2020   Procedure: ULTRASOUND GUIDANCE FOR VASCULAR ACCESS;  Surgeon: Sherren Kerns, MD;  Location: [facility] OR;  Service: Vascular;  Laterality: Right;   VASECTOMY     VEIN HARVEST Right 06/17/2022   Procedure: IPSILATERAL RIGHT GREATER SAPHENOUS VEIN HARVEST;  Surgeon: Victorino Sparrow, MD;  Location: [facility] OR;  Service: Vascular;  Laterality: Right;    Social History:  reports that he has never smoked. He has never been exposed to tobacco smoke. He has never used smokeless tobacco. He reports current alcohol use. He reports that  he does not use drugs. Family History:  Family History  Problem Relation Age of Onset   CAD Father    Hypertension Father    Alcohol abuse Father        Cause of death   Diabetes Mother    Colon polyps Mother    CAD Brother 11       CABG   Colon cancer Neg Hx    Esophageal cancer Neg Hx    Rectal cancer Neg Hx    Stomach cancer Neg Hx      HOME MEDICATIONS: Allergies as of 12/30/2022       Reactions   Codeine Nausea And Vomiting   Percocet [oxycodone-acetaminophen] Nausea And Vomiting   Tramadol Hcl Nausea And Vomiting        Medication List        Accurate as of December 30, 2022 12:21 PM. If you have any  questions, ask your nurse or doctor.          acetaminophen 325 MG tablet Commonly known as: TYLENOL Take 1-2 tablets (325-650 mg total) by mouth every 4 (four) hours as needed for mild pain.   ascorbic acid 1000 MG tablet Commonly known as: VITAMIN C Take 1 tablet (1,000 mg total) by mouth daily.   atorvastatin 40 MG tablet Commonly known as: LIPITOR Take 1 tablet (40 mg total) by mouth in the morning.   bisacodyl 5 MG EC tablet Commonly known as: DULCOLAX Take 1 tablet (5 mg total) by mouth daily as needed for moderate constipation.   carvedilol 3.125 MG tablet Commonly known as: COREG Take 1 tablet (3.125 mg total) by mouth 2 (two) times daily with a meal.   [employer] Aspirin Low Dose 81 MG tablet Generic drug: aspirin EC TAKE 1 TABLET (81 MG TOTAL) BY MOUTH IN THE MORNING   cyanocobalamin 500 MCG tablet Commonly known as: VITAMIN B12 Take 500 mcg by mouth in the morning.   docusate sodium 100 MG capsule Commonly known as: COLACE Take 1 capsule (100 mg total) by mouth daily.   Eliquis 5 MG Tabs tablet Generic drug: apixaban Take 1 tablet (5 mg total) by mouth 2 (two) times daily.   ferrous sulfate 325 (65 FE) MG EC tablet Take by mouth.   FreeStyle Libre 14 Day Sensor Misc Inject 1 Device into the skin every 14 (fourteen) days.   furosemide 40 MG tablet Commonly known as: LASIX Take 1 tablet (40 mg total) by mouth daily. In the evening and an 80 mg tablet daily in the morning. What changed: additional instructions   gabapentin 300 MG capsule Commonly known as: NEURONTIN Take 1 capsule (300 mg total) by mouth 2 (two) times daily AND 2 capsules (600 mg total) at bedtime.   hydrALAZINE 10 MG tablet Commonly known as: APRESOLINE Take by mouth.   HYDROcodone-acetaminophen 5-325 MG tablet Commonly known as: NORCO/VICODIN Take 1 tablet by mouth every 6 (six) hours as needed for moderate pain.   insulin aspart protamine - aspart (70-30) 100 UNIT/ML  FlexPen Commonly known as: NOVOLOG 70/30 MIX Inject 5 Units into the skin. With meals   insulin NPH Human 100 UNIT/ML injection Commonly known as: NOVOLIN N Inject into the skin.   insulin NPH-regular Human (70-30) 100 UNIT/ML injection Inject 5 Units into the skin 2 (two) times daily. With breakfast and with dinner   isosorbide mononitrate 30 MG 24 hr tablet Commonly known as: IMDUR Take 0.5 tablets (15 mg total) by mouth daily.   melatonin  3 MG Tabs tablet Take 1 tablet (3 mg total) by mouth at bedtime as needed (insomnia).   polyethylene glycol 17 g packet Commonly known as: MIRALAX / GLYCOLAX Take 17 g by mouth daily.   PRESCRIPTION MEDICATION Inject 0.1-1 mLs as directed See admin instructions. Tri-Mix Standard Strength 5 ml. Formula: Prostaglandin 10 mcg/ml, Papaverine 30 mg/ml, Phentolamine 1 mg/ml. Inject 0.1 ml into side of penis as directed as needed (to be injected immediately before sexual intercourse) may increase the dose by 0.1 ml every 48 hours to achieve an erection. Max dose 1 ml.   triamcinolone cream 0.1 % Commonly known as: KENALOG Apply topically 2 (two) times daily.   Vitamin D-3 25 MCG (1000 UT) Caps Take 1,000 Units by mouth daily.   zinc sulfate 220 (50 Zn) MG capsule Take 1 capsule (220 mg total) by mouth daily.         ALLERGIES: Allergies  Allergen Reactions   Codeine Nausea And Vomiting   Percocet [Oxycodone-Acetaminophen] Nausea And Vomiting   Tramadol Hcl Nausea And Vomiting        OBJECTIVE:   VITAL SIGNS: BP 132/80 (BP Location: Left Arm, Patient Position: Sitting, Cuff Size: Large)   Pulse (!) 59   Ht 5\' 7"  (1.702 m)   Wt 178 lb (80.7 kg) Comment: withprosthetic  SpO2 97%   BMI 27.88 kg/m    PHYSICAL EXAM:  General: Pt appears well and is in NAD  Neck: General: Supple without adenopathy or carotid bruits. Thyroid: Thyroid size normal.  No goiter or nodules appreciated.   Lungs: Clear with good BS bilat   Heart: RRR,  + systolic murmur   Extremities:  Lower extremities - Trace edema on the left, Right BKA  Neuro: MS is good with appropriate affect, pt is alert and Ox3    DM foot exam: Per podiatry 04/12/2022   DATA REVIEWED:  Lab Results  Component Value Date   HGBA1C 7.9 (H) 08/11/2022   HGBA1C 7.8 (H) 07/29/2022   HGBA1C 7.5 (A) 07/25/2022    Latest Reference Range & Units 09/26/22 09:47  Sodium 134 - 144 mmol/L 139  Potassium 3.5 - 5.2 mmol/L 4.6  Chloride 96 - 106 mmol/L 105  CO2 20 - 29 mmol/L 20  Glucose 70 - 99 mg/dL 960 (H)  BUN 8 - 27 mg/dL 23  Creatinine 4.54 - 0.98 mg/dL 1.19  Calcium 8.6 - 14.7 mg/dL 9.0  BUN/Creatinine Ratio 10 - 24  18  eGFR >59 mL/min/1.73 61  Alkaline Phosphatase 44 - 121 IU/L 139 (H)  Albumin 3.8 - 4.8 g/dL 3.6 (L)  AST 0 - 40 IU/L 18  ALT 0 - 44 IU/L 10  Total Protein 6.0 - 8.5 g/dL 7.1  Total Bilirubin 0.0 - 1.2 mg/dL 0.3   Old records , labs and images have been reviewed.    ASSESSMENT / PLAN / RECOMMENDATIONS:   1) Type 2 Diabetes Mellitus, Optimally controlled, With CKD  III and macrovascular complications - Most recent A1c of 6.8%. Goal A1c <7.0%.     - A1c at goal - I emphasized the importance of taking his insulin BEFORE meals and that taking it at bedtime would increase his risk of hypoglycemia  - I have attempted to switch Synjardy to 12.07-998 BID but he gets this through the [place] and continue to be on 25-1000 mg daily  - Pt with Hx of Pancreatitis, he is not a candidate DPP-4 inhibitors, GLP-1 agonists and Tirzeptide - NO changes at this time,no  CGM data today     MEDICATIONS: Continue  Synjardy 25-1000 mg  daily  Continue  Novolog Mix 5 units BID   EDUCATION / INSTRUCTIONS: BG monitoring instructions: Patient is instructed to check his blood sugars 3 times a day, before meals . Call [place] [facility] if: BG persistently < 70  I reviewed the Rule of 15 for the treatment of hypoglycemia in detail with the patient.  Literature supplied.   2) Diabetic complications:  Eye: Does not have known diabetic retinopathy.  Neuro/ Feet: Does  have known diabetic peripheral neuropathy. Renal: Patient does have known baseline CKD. He is not on an ACEI/ARB at present, will consider in the future when GFR stabilized     3) Dyslipidemia :  -LDL at goal, patient on statin therapy   I spent 25 minutes preparing to see the patient by review of recent labs, imaging and procedures, obtaining and reviewing separately obtained history, communicating with the patient, ordering medications, tests or procedures, and documenting clinical information in the EHR including the differential Dx, treatment, and any further evaluation and other management    Signed electronically by: Lyndle Herrlich, MD  [facility]  [facility] [address] [place], [place] [address] Phone: [phone] FAX: [phone]   CC: Hoy Register, MD [address] [place] [address] Phone: [phone]  Fax: [phone]    Return to Endocrinology clinic as below: Future Appointments  Date Time Provider Department Center  01/02/2023  1:20 PM Genice Rouge, MD CPR-PRMA CPR  01/04/2023 10:30 AM Riley Churches, RN [facility]-CCC None  01/11/2023  1:50 PM [facility]-CV CH ECHO 2 [facility]SITE3ECHO [facility]  01/11/2023  2:45 PM [facility] STRUCTURAL HEART APP [facility]  01/23/2023  3:10 PM Hoy Register, MD CHW-CHWW None  04/20/2023 10:30 AM [facility]-CV HS VASC 4 MC-HCVI VVS  04/20/2023 11:20 AM Victorino Sparrow, MD [facility] VVS  05/01/2023  2:00 PM [facility]-MED-ONC LAB [facility]-MEDONC None  05/11/2023 12:00 PM Artis Delay, MD [facility] None

## 2023-01-02 ENCOUNTER — Encounter: Payer: Medicare HMO | Attending: Physical Medicine and Rehabilitation | Admitting: Physical Medicine and Rehabilitation

## 2023-01-02 DIAGNOSIS — Z89511 Acquired absence of right leg below knee: Secondary | ICD-10-CM | POA: Insufficient documentation

## 2023-01-02 DIAGNOSIS — Z9889 Other specified postprocedural states: Secondary | ICD-10-CM | POA: Insufficient documentation

## 2023-01-02 DIAGNOSIS — I70229 Atherosclerosis of native arteries of extremities with rest pain, unspecified extremity: Secondary | ICD-10-CM | POA: Insufficient documentation

## 2023-01-02 DIAGNOSIS — Z993 Dependence on wheelchair: Secondary | ICD-10-CM | POA: Insufficient documentation

## 2023-01-03 ENCOUNTER — Encounter: Payer: Medicare HMO | Admitting: Physical Therapy

## 2023-01-04 ENCOUNTER — Ambulatory Visit: Payer: Self-pay

## 2023-01-04 NOTE — Patient Outreach (Signed)
Care Coordination   Follow Up Visit Note   01/04/2023 Name: Caleb [name] MRN: [id number] DOB: [birth year]  Caleb [name] is a 72 y.o. year old male who sees Hoy Register, MD for primary care. I spoke with  Caleb [name] by phone today.  What matters to the patients health and wellness today?  Patient would like to schedule his penile implant. He would like to resume his exercise regimen.     Goals Addressed               This Visit's Progress     Patient Stated     To be active again without shortness of breath (pt-stated)   On track     Care Coordination Interventions: Evaluation of current treatment plan related to s/p TAVR and patient's adherence to plan as established by provider Reviewed and discussed with patient his next upcoming scheduled Echocardiogram and in person follow up with Dr. Allyson Sabal as scheduled for 02/10/23 @09 :15 AM/10:15 AM       To lower A1c <7.0% (pt-stated)   On track     Care Coordination Interventions: Evaluation of current treatment plan related to type 2 diabetes mellitus and patient's adherence to plan as established by provider Determined patient completed a recent follow up with Endocrinologist, Dr. Lonzo Cloud Review of patient status, including review of consultant's reports, relevant laboratory and other test results, and medications completed Discussed with patient he plans to schedule a penile implant in the near future since having achieved his goal of lowering his A1c <7.0%  Determined patient is eating out less and choosing a healthy meal plan when cooking at home Positive reinforcement provided to patient for making efforts to improve his diabetes and overall health  Lab Results  Component Value Date   HGBA1C 6.8 (A) 12/30/2022       Other     COMPLETED: No complications from Decubitus ulcer of right heel        Care Coordination Interventions: Evaluation of current treatment plan related to s/p right BKA  and patient's  adherence to plan as established by provider Determined patient is making a full recovery following his recent right BKA Determined patient has regained his ambulation with use of his prothesis, he denies falls and has plans to resume his gym activity in the near future, he has resumed driving  Discussed with patient he plans to make his move to [place] to be closer to his family in January    Interventions Today    Flowsheet Row Most Recent Value  Chronic Disease   Chronic disease during today's visit Other, Diabetes  [s/p right BKA]  General Interventions   General Interventions Discussed/Reviewed General Interventions Discussed, General Interventions Reviewed, Doctor Visits, Labs, Durable Medical Equipment (DME)  Doctor Visits Discussed/Reviewed Doctor Visits Discussed, Doctor Visits Reviewed, Specialist, PCP  Durable Medical Equipment (DME) Glucomoter, Other  [prosthesis]  Exercise Interventions   Exercise Discussed/Reviewed Physical Activity, Exercise Reviewed, Exercise Discussed  Physical Activity Discussed/Reviewed Physical Activity Reviewed, Physical Activity Discussed, Gym  Education Interventions   Education Provided Provided Education  Provided Verbal Education On Nutrition, When to see the doctor, Labs, Medication, Exercise  Labs Reviewed Hgb A1c  Nutrition Interventions   Nutrition Discussed/Reviewed Nutrition Discussed, Nutrition Reviewed, Adding fruits and vegetables, Carbohydrate meal planning, Portion sizes  Pharmacy Interventions   Pharmacy Dicussed/Reviewed Pharmacy Topics Reviewed, Pharmacy Topics Discussed, Medications and their functions  Safety Interventions   Safety Discussed/Reviewed Safety Reviewed, Safety Discussed, Fall Risk, Home Safety  Home Safety Assistive Devices          SDOH assessments and interventions completed:  No     Care Coordination Interventions:  Yes, provided   Follow up plan: Follow up call scheduled for 02/14/23 @11 :30 AM     Encounter Outcome:  Patient Visit Completed

## 2023-01-04 NOTE — Patient Instructions (Signed)
Visit Information  Thank you for taking time to visit with me today. Please don't hesitate to contact me if I can be of assistance to you.   Following are the goals we discussed today:   Goals Addressed               This Visit's Progress     Patient Stated     To be active again without shortness of breath (pt-stated)   On track     Care Coordination Interventions: Evaluation of current treatment plan related to s/p TAVR and patient's adherence to plan as established by provider Reviewed and discussed with patient his next upcoming scheduled Echocardiogram and in person follow up with Dr. Allyson Sabal as scheduled for 02/10/23 @09 :15 AM/10:15 AM       To lower A1c <7.0% (pt-stated)   On track     Care Coordination Interventions: Evaluation of current treatment plan related to type 2 diabetes mellitus and patient's adherence to plan as established by provider Determined patient completed a recent follow up with Endocrinologist, Dr. Lonzo Cloud Review of patient status, including review of consultant's reports, relevant laboratory and other test results, and medications completed Discussed with patient he plans to schedule a penile implant in the near future since having achieved his goal of lowering his A1c <7.0%  Determined patient is eating out less and choosing a healthy meal plan when cooking at home Positive reinforcement provided to patient for making efforts to improve his diabetes and overall health  Lab Results  Component Value Date   HGBA1C 6.8 (A) 12/30/2022       Other     COMPLETED: No complications from Decubitus ulcer of right heel        Care Coordination Interventions: Evaluation of current treatment plan related to s/p right BKA  and patient's adherence to plan as established by provider Determined patient is making a full recovery following his recent right BKA Determined patient has regained his ambulation with use of his prothesis, he denies falls and has plans to  resume his gym activity in the near future, he has resumed driving  Discussed with patient he plans to make his move to [place] to be closer to his family in January        Our next appointment is by telephone on 02/14/23 at 11:30 AM  Please call the care guide team at [phone] if you need to cancel or reschedule your appointment.   If you are experiencing a Mental Health or Behavioral Health Crisis or need someone to talk to, please call 1-800-273-TALK (toll free, 24 hour hotline)  Patient verbalizes understanding of instructions and care plan provided today and agrees to view in MyChart. Active MyChart status and patient understanding of how to access instructions and care plan via MyChart confirmed with patient.     Delsa Sale RN BSN CCM [place]  [facility], [facility] Nurse Care Coordinator  Direct Dial: [phone] Website: [email or web address]

## 2023-01-05 ENCOUNTER — Encounter: Payer: Medicare HMO | Admitting: Physical Therapy

## 2023-01-09 ENCOUNTER — Encounter: Payer: Medicare HMO | Admitting: Physical Therapy

## 2023-01-11 ENCOUNTER — Other Ambulatory Visit (HOSPITAL_COMMUNITY): Payer: Medicare HMO

## 2023-01-11 ENCOUNTER — Ambulatory Visit: Payer: Medicare HMO

## 2023-01-12 ENCOUNTER — Encounter: Payer: Medicare HMO | Admitting: Physical Therapy

## 2023-01-23 ENCOUNTER — Ambulatory Visit: Payer: Medicare HMO | Admitting: Family Medicine

## 2023-01-24 DIAGNOSIS — H524 Presbyopia: Secondary | ICD-10-CM | POA: Diagnosis not present

## 2023-01-25 ENCOUNTER — Other Ambulatory Visit: Payer: Self-pay | Admitting: Family Medicine

## 2023-01-25 DIAGNOSIS — I110 Hypertensive heart disease with heart failure: Secondary | ICD-10-CM

## 2023-01-30 ENCOUNTER — Encounter: Payer: Self-pay | Admitting: Family Medicine

## 2023-01-30 ENCOUNTER — Ambulatory Visit: Payer: Medicare HMO | Attending: Family Medicine | Admitting: Family Medicine

## 2023-01-30 VITALS — BP 127/82 | HR 76 | Ht 67.0 in | Wt 177.8 lb

## 2023-01-30 DIAGNOSIS — E1169 Type 2 diabetes mellitus with other specified complication: Secondary | ICD-10-CM

## 2023-01-30 DIAGNOSIS — I779 Disorder of arteries and arterioles, unspecified: Secondary | ICD-10-CM | POA: Diagnosis not present

## 2023-01-30 DIAGNOSIS — I5042 Chronic combined systolic (congestive) and diastolic (congestive) heart failure: Secondary | ICD-10-CM

## 2023-01-30 DIAGNOSIS — I110 Hypertensive heart disease with heart failure: Secondary | ICD-10-CM

## 2023-01-30 DIAGNOSIS — G4452 New daily persistent headache (NDPH): Secondary | ICD-10-CM

## 2023-01-30 DIAGNOSIS — Z794 Long term (current) use of insulin: Secondary | ICD-10-CM

## 2023-01-30 DIAGNOSIS — I739 Peripheral vascular disease, unspecified: Secondary | ICD-10-CM

## 2023-01-30 NOTE — Progress Notes (Unsigned)
Subjective:  Patient ID: Caleb [name], male    DOB: September 09, 1950  Age: 72 y.o. MRN: [id number]  CC: Headache   HPI Caleb [name] is a 72 y.o. year old male with a history of type 2 diabetes mellitus (A1c 6.8 managed by Endocrine), hypertension, hyperlipidemia, s/p L 4th and 5th  toe metatarsal amputation, PAD status post b/l popliteal artery intervention, bilateral carotid artery stenosis status post bilateral  transcarotid artery revascularization (in 11/2019 and 01/2020), HFrEF (EF 25-30%, LV global hypokinesis, grade 2 DD from echo 05/2022),  AVR aortic valve stenosis s/p TVAR from 10/2021, b/l carotid stenosis (status post TCAR), right BKA due to osteomyelitis in 08/2022, MGUS.  Interval History: Discussed the use of AI scribe software for clinical note transcription with the patient, who gave verbal consent to proceed.  He presents with migraines that have been occurring for the past two weeks. The migraines are described as intense, often reaching a pain level of 10, and are located on the right side of the head.  Starting from his neck to his right ear.  The patient notes that the migraines are not constant, but occur periodically throughout the day, often when he goes to bed at night. The patient denies sensitivity to light, nausea, vomiting, and blurry vision associated with the migraines. The patient also mentions a fall in June where he hit his head, but CT head from 07/2022 revealed no acute intracranial abnormality, generalized cerebral atrophy and microvascular disease changes of the supratentorial brain.  The patient has been taking isosorbide and hydralazine for his Hypertension, and further questioning he has recently been taking Viagra 'to get rid of a surplus of pills'.    Carotid Doppler from 09/2022 revealed patent right ICA stent without evidence of restenosis.  Patent left ICA stent with elevated velocities suggestive of a greater than 75% stenosis. He does have a vascular surgeon  whom he does not see until early next year.  His diabetes is managed by endocrine with his last visit last month.  He is also under the care of cardiology for management of his HFrEF.  Denies shortness of breath or pedal edema.  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 Excell Seltzer & Bartle   Severe aortic stenosis    Vitamin D deficiency 06/23/2015    Past Surgical History:  Procedure Laterality Date   ABDOMINAL AORTOGRAM W/LOWER EXTREMITY N/A 08/08/2019   Procedure: ABDOMINAL AORTOGRAM W/LOWER EXTREMITY;  Surgeon: Runell Gess, MD;  Location: [facility];  Service: Cardiovascular;  Laterality: N/A;   ABDOMINAL AORTOGRAM W/LOWER EXTREMITY N/A 11/18/2019   Procedure: ABDOMINAL AORTOGRAM W/LOWER EXTREMITY;  Surgeon: Runell Gess, MD;  Location: [facility];  Service: Cardiovascular;  Laterality: N/A;   ABDOMINAL AORTOGRAM W/LOWER EXTREMITY N/A 06/13/2022   Procedure: ABDOMINAL AORTOGRAM W/LOWER EXTREMITY;  Surgeon: Chuck Hint,  MD;  Location: [facility];  Service: Cardiovascular;  Laterality: N/A;   AMPUTATION Right 08/19/2022   Procedure: RIGHT BELOW KNEE AMPUTATION;  Surgeon: Nadara Mustard, MD;  Location: [facility] OR;  Service: Orthopedics;  Laterality: Right;   APPLICATION OF WOUND VAC  06/24/2022   Procedure: APPLICATION OF WOUND VAC;  Surgeon: Nadara Mustard, MD;  Location: [facility] OR;  Service: Orthopedics;;   CARDIAC CATHETERIZATION     COLONOSCOPY  12 years ago   in [place] clinic= normal exam per pt   ENDARTERECTOMY FEMORAL Right 06/17/2022   Procedure: SUPERFICIAL ENDARTERECTOMY OF COMMON FEMORAL ARTERY;  Surgeon: Victorino Sparrow, MD;  Location: [facility] OR;  Service: Vascular;  Laterality: Right;   FEMORAL-TIBIAL BYPASS GRAFT Right 06/17/2022   Procedure: RIGHT FEMORAL- ANTERIOR TIBIAL ARTERY BYPASS;  Surgeon: Victorino Sparrow, MD;  Location: [facility] OR;  Service: Vascular;  Laterality: Right;   I & D EXTREMITY Right 06/24/2022   Procedure: RIGHT PARTIAL CALCANEUS EXCISION;  Surgeon: Nadara Mustard, MD;  Location: [facility] OR;  Service: Orthopedics;  Laterality: Right;   I & D EXTREMITY Right 07/29/2022   Procedure: RIGHT PARTIAL CALCANEUS EXCISION;  Surgeon: Nadara Mustard, MD;  Location: [facility] OR;  Service: Orthopedics;  Laterality: Right;   INTRAOPERATIVE TRANSTHORACIC ECHOCARDIOGRAM N/A 01/11/2022   Procedure: INTRAOPERATIVE TRANSTHORACIC ECHOCARDIOGRAM;  Surgeon: Tonny Bollman, MD;  Location: [facility] INVASIVE CV LAB;  Service: Open Heart Surgery;  Laterality: N/A;   KNEE SURGERY     MULTIPLE EXTRACTIONS WITH ALVEOLOPLASTY N/A 12/09/2021   Procedure: MULTIPLE EXTRACTION;  Surgeon: Sharman Cheek, DMD;  Location: [facility] OR;  Service: Dentistry;  Laterality: N/A;   PERIPHERAL VASCULAR INTERVENTION Right 08/08/2019   Procedure: PERIPHERAL VASCULAR INTERVENTION;  Surgeon: Runell Gess, MD;  [facility]: [facility];  Service: Cardiovascular;  Laterality: Right;   PERIPHERAL VASCULAR INTERVENTION Left 11/18/2019   Procedure: PERIPHERAL VASCULAR INTERVENTION;  Surgeon: Runell Gess, MD;  [facility]: [facility];  Service: Cardiovascular;  Laterality: Left;  left popliteal artery   RIGHT/LEFT HEART CATH AND CORONARY ANGIOGRAPHY N/A 10/29/2021   Procedure: RIGHT/LEFT HEART CATH AND CORONARY ANGIOGRAPHY;  Surgeon: Kathleene Hazel, MD;  [facility]: [facility];  Service: Cardiovascular;  Laterality: N/A;   TRANSCAROTID ARTERY REVASCULARIZATION  Left 12/16/2019   Procedure: TRANSCAROTID ARTERY REVASCULARIZATION;  Surgeon: Sherren Kerns, MD;  Location: [facility] OR;  Service: Vascular;  Laterality: Left;   TRANSCAROTID ARTERY REVASCULARIZATION  Right 02/03/2020   Procedure: RIGHT TRANSCAROTID ARTERY REVASCULARIZATION;  Surgeon: Sherren Kerns, MD;  Location: [facility] OR;  Service: Vascular;  Laterality: Right;   TRANSCATHETER AORTIC VALVE REPLACEMENT, TRANSFEMORAL N/A 01/11/2022   Procedure: Transcatheter Aortic Valve Replacement, Transfemoral;  Surgeon: Tonny Bollman, MD;  Location: [facility] INVASIVE CV LAB;  Service: Open Heart Surgery;  Laterality: N/A;   ULTRASOUND GUIDANCE FOR VASCULAR ACCESS Right 12/16/2019   Procedure: ULTRASOUND GUIDANCE FOR VASCULAR ACCESS;  Surgeon: Sherren Kerns, MD;  Location: [facility] OR;  Service: Vascular;  Laterality: Right;   ULTRASOUND GUIDANCE FOR VASCULAR ACCESS Right 02/03/2020   Procedure: ULTRASOUND GUIDANCE FOR VASCULAR ACCESS;  Surgeon: Sherren Kerns, MD;  Location: [facility] OR;  Service: Vascular;  Laterality: Right;   VASECTOMY     VEIN HARVEST Right 06/17/2022   Procedure: IPSILATERAL RIGHT GREATER SAPHENOUS VEIN HARVEST;  Surgeon: Victorino Sparrow, MD;  Location: [facility] OR;  Service: Vascular;  Laterality: Right;    Family History  Problem Relation Age of Onset  CAD Father    Hypertension Father    Alcohol abuse Father        Cause of death   Diabetes Mother    Colon polyps Mother    CAD Brother 16       CABG   Colon cancer Neg Hx    Esophageal cancer Neg Hx    Rectal cancer Neg Hx    Stomach cancer Neg Hx     Social History   Socioeconomic History   Marital status: Widowed    Spouse name: Not on file   Number of children: 3   Years of education: Not on file   Highest education level: High school graduate  Occupational History   Occupation: Caregiver    Comment: Special needs    Comment:  retired  Tobacco Use   Smoking status: Never    Passive exposure: Never   Smokeless tobacco: Never  Vaping Use   Vaping status: Never Used  Substance and Sexual Activity   Alcohol use: Yes    Comment: occasional   Drug use: No   Sexual activity: Not Currently  Other Topics Concern   Not on file  Social History Narrative   Widower.  3 daughters and 3 grandchildren.     Social Determinants of Health   Financial Resource Strain: Low Risk  (10/29/2021)   Overall Financial Resource Strain (CARDIA)    Difficulty of Paying Living Expenses: Not hard at all  Food Insecurity: No Food Insecurity (08/19/2022)   Hunger Vital Sign    Worried About Running Out of Food in the Last Year: Never true    Ran Out of Food in the Last Year: Never true  Transportation Needs: No Transportation Needs (08/19/2022)   PRAPARE - Administrator, Civil Service (Medical): No    Lack of Transportation (Non-Medical): No  Physical Activity: Sufficiently Active (12/06/2020)   Exercise Vital Sign    Days of Exercise per Week: 4 days    Minutes of Exercise per Session: 60 min  Stress: No Stress Concern Present (12/06/2020)   [employer] of Occupational Health - Occupational Stress Questionnaire    Feeling of Stress : Not at all  Social Connections: Moderately Isolated (12/06/2020)   Social Connection and Isolation Panel [NHANES]    Frequency of Communication with Friends and Family: More than three times a week    Frequency of Social Gatherings with Friends and Family: More than three times a week    Attends Religious Services: More than 4 times per year    Active Member of [employer] or Organizations: No    Attends Banker Meetings: Never    Marital Status: Widowed    Allergies  Allergen Reactions   Codeine Nausea And Vomiting   Percocet [Oxycodone-Acetaminophen] Nausea And Vomiting   Tramadol Hcl Nausea And Vomiting    Outpatient Medications Prior to Visit  Medication Sig  Dispense Refill   acetaminophen (TYLENOL) 325 MG tablet Take 1-2 tablets (325-650 mg total) by mouth every 4 (four) hours as needed for mild pain.     apixaban (ELIQUIS) 5 MG TABS tablet Take 1 tablet (5 mg total) by mouth 2 (two) times daily. 180 tablet 1   ascorbic acid (VITAMIN C) 1000 MG tablet Take 1 tablet (1,000 mg total) by mouth daily.     atorvastatin (LIPITOR) 40 MG tablet Take 1 tablet (40 mg total) by mouth in the morning. 90 tablet 1   bisacodyl (DULCOLAX) 5 MG EC tablet Take  1 tablet (5 mg total) by mouth daily as needed for moderate constipation. 30 tablet 0   carvedilol (COREG) 3.125 MG tablet Take 1 tablet (3.125 mg total) by mouth 2 (two) times daily with a meal. 180 tablet 1   Cholecalciferol (VITAMIN D-3) 25 MCG (1000 UT) CAPS Take 1,000 Units by mouth daily.     Continuous Blood Gluc Sensor (FREESTYLE LIBRE 14 DAY SENSOR) MISC Inject 1 Device into the skin every 14 (fourteen) days.     [employer] ASPIRIN LOW DOSE 81 MG tablet TAKE 1 TABLET (81 MG TOTAL) BY MOUTH IN THE MORNING 90 tablet 1   docusate sodium (COLACE) 100 MG capsule Take 1 capsule (100 mg total) by mouth daily. 30 capsule 0   ferrous sulfate 325 (65 FE) MG EC tablet Take by mouth.     furosemide (LASIX) 40 MG tablet Take 1 tablet (40 mg total) by mouth daily. In the evening and an 80 mg tablet daily in the morning. (Patient taking differently: Take 40 mg by mouth daily.) 90 tablet 1   gabapentin (NEURONTIN) 300 MG capsule Take 1 capsule (300 mg total) by mouth 2 (two) times daily AND 2 capsules (600 mg total) at bedtime. 120 capsule 1   hydrALAZINE (APRESOLINE) 10 MG tablet Take by mouth.     HYDROcodone-acetaminophen (NORCO/VICODIN) 5-325 MG tablet Take 1 tablet by mouth every 6 (six) hours as needed for moderate pain. 28 tablet 0   insulin aspart protamine - aspart (NOVOLOG 70/30 MIX) (70-30) 100 UNIT/ML FlexPen Inject 5 Units into the skin. With meals     isosorbide mononitrate (IMDUR) 30 MG 24 hr tablet Take 0.5  tablets (15 mg total) by mouth daily. 45 tablet 1   melatonin 3 MG TABS tablet Take 1 tablet (3 mg total) by mouth at bedtime as needed (insomnia). 30 tablet 0   polyethylene glycol (MIRALAX / GLYCOLAX) 17 g packet Take 17 g by mouth daily. 14 each 0   PRESCRIPTION MEDICATION Inject 0.1-1 mLs as directed See admin instructions. Tri-Mix Standard Strength 5 ml. Formula: Prostaglandin 10 mcg/ml, Papaverine 30 mg/ml, Phentolamine 1 mg/ml. Inject 0.1 ml into side of penis as directed as needed (to be injected immediately before sexual intercourse) may increase the dose by 0.1 ml every 48 hours to achieve an erection. Max dose 1 ml.     triamcinolone cream (KENALOG) 0.1 % Apply topically 2 (two) times daily. 30 g 0   vitamin B-12 (CYANOCOBALAMIN) 500 MCG tablet Take 500 mcg by mouth in the morning.     zinc sulfate 220 (50 Zn) MG capsule Take 1 capsule (220 mg total) by mouth daily.     No facility-administered medications prior to visit.     ROS Review of Systems  Constitutional:  Negative for activity change and appetite change.  HENT:  Negative for sinus pressure and sore throat.   Respiratory:  Negative for chest tightness, shortness of breath and wheezing.   Cardiovascular:  Negative for chest pain and palpitations.  Gastrointestinal:  Negative for abdominal distention, abdominal pain and constipation.  Genitourinary: Negative.   Musculoskeletal: Negative.   Neurological:  Positive for headaches.  Psychiatric/Behavioral:  Negative for behavioral problems and dysphoric mood.     Objective:  BP 127/82   Pulse 76   Ht 5\' 7"  (1.702 m)   Wt 177 lb 12.8 oz (80.6 kg)   SpO2 98%   BMI 27.85 kg/m      01/30/2023    2:26 PM 12/30/2022  12:11 PM 10/14/2022    3:20 PM  BP/Weight  Systolic BP 127 132 119  Diastolic BP 82 80 70  Wt. (Lbs) 177.8 178 161  BMI 27.85 kg/m2 27.88 kg/m2 25.22 kg/m2      Physical Exam Constitutional:      Appearance: He is well-developed.   Cardiovascular:     Rate and Rhythm: Normal rate.     Heart sounds: Normal heart sounds. No murmur heard. Pulmonary:     Effort: Pulmonary effort is normal.     Breath sounds: Normal breath sounds. No wheezing or rales.  Chest:     Chest wall: No tenderness.  Abdominal:     General: Bowel sounds are normal. There is no distension.     Palpations: Abdomen is soft. There is no mass.     Tenderness: There is no abdominal tenderness.  Musculoskeletal:        General: Normal range of motion.     Right lower leg: No edema.     Left lower leg: No edema.  Neurological:     Mental Status: He is alert and oriented to person, place, and time.  Psychiatric:        Mood and Affect: Mood normal.        Latest Ref Rng & Units 09/26/2022    9:47 AM 09/05/2022    6:04 AM 09/03/2022    5:27 AM  CMP  Glucose 70 - 99 mg/dL 409  811  914   BUN 8 - 27 mg/dL 23  47  63   Creatinine 0.76 - 1.27 mg/dL 7.82  9.56  2.13   Sodium 134 - 144 mmol/L 139  135  131   Potassium 3.5 - 5.2 mmol/L 4.6  4.5  4.8   Chloride 96 - 106 mmol/L 105  98  95   CO2 20 - 29 mmol/L 20  25  26    Calcium 8.6 - 10.2 mg/dL 9.0  9.2  9.1   Total Protein 6.0 - 8.5 g/dL 7.1     Total Bilirubin 0.0 - 1.2 mg/dL 0.3     Alkaline Phos 44 - 121 IU/L 139     AST 0 - 40 IU/L 18     ALT 0 - 44 IU/L 10       Lipid Panel     Component Value Date/Time   CHOL 88 06/14/2022 0119   CHOL 106 06/05/2020 0947   TRIG 60 06/14/2022 0119   HDL 33 (L) 06/14/2022 0119   HDL 53 06/05/2020 0947   CHOLHDL 2.7 06/14/2022 0119   VLDL 12 06/14/2022 0119   LDLCALC 43 06/14/2022 0119   LDLCALC 29 06/05/2020 0947    CBC    Component Value Date/Time   WBC 4.0 09/26/2022 0947   WBC 5.8 09/05/2022 0604   RBC 4.22 09/26/2022 0947   RBC 3.77 (L) 09/05/2022 0604   HGB 11.9 (L) 09/26/2022 0947   HCT 37.1 (L) 09/26/2022 0947   PLT 196 09/26/2022 0947   MCV 88 09/26/2022 0947   MCH 28.2 09/26/2022 0947   MCH 26.3 09/05/2022 0604   MCHC 32.1  09/26/2022 0947   MCHC 30.8 09/05/2022 0604   RDW 17.3 (H) 09/26/2022 0947   LYMPHSABS 1.6 09/26/2022 0947   MONOABS 0.9 08/25/2022 1018   EOSABS 0.3 09/26/2022 0947   BASOSABS 0.0 09/26/2022 0947    Lab Results  Component Value Date   HGBA1C 6.8 (A) 12/30/2022    Assessment & Plan:  Headache New onset, intermittent, severe headaches for the past 2 weeks, predominantly on the right side. No associated photophobia, nausea, vomiting, or visual changes. Recent use of Viagra, which can cause vascular headaches when combined with patient's current medications (isosorbide and hydralazine). -Discontinue Viagra and Cialis due to risk of vascular headaches and potential for brain bleed when combined with current medications. -Viagra or Cialis does not appear on his med list so he must have used a leftover prescription which he had.  Advised that Viagra and Cialis are contraindicated for him based on his medications. -Start symptom diary to track headache frequency and intensity. -Use over-the-counter Tylenol Extra Strength for pain management. -Order blood tests to rule out Temporal Arteritis -ESR -If symptoms persist after 1 week, order imaging of the brain but on second thoughts will order CT scan of the head to evaluate to exclude subarachnoid hemorrhage.  We will schedule this and notify him to return for this.  Carotid Stenosis History of carotid stenosis with stent placement. Recent ultrasound showed right carotid artery stent is open, but left carotid artery stent has elevated velocity suggestive of >75% stenosis. -Risk factor modification -Continue current management and follow-up with vascular doctor   Type II diabetes Well-controlled with recent A1c of 6.8. -Continue current management and follow-up with endocrinologist as scheduled.  -Counseled on Diabetic diet, my plate method, 329 minutes of moderate intensity exercise/week Blood sugar logs with fasting goals of 80-120  mg/dl, random of less than 518 and in the event of sugars less than 60 mg/dl or greater than 841 mg/dl encouraged to notify the clinic. Advised on the need for annual eye exams, annual foot exams, Pneumonia vaccine.  Hypertensive heart disease with reduced EF -EF of 25 to 30% -Euvolemic Continue current medications Continue follow-up with cardiology         No orders of the defined types were placed in this encounter.   Follow-up: Return in about 3 months (around 05/02/2023), or if symptoms worsen or fail to improve, for Chronic medical conditions.       Hoy Register, MD, FAAFP. [facility] [place], [place] [phone]   01/30/2023, 4:24 PM

## 2023-01-30 NOTE — Patient Instructions (Signed)
General Headache Without Cause A headache is pain or discomfort felt around the head or neck area. There are many causes and types of headaches. A few common types include: Tension headaches. Migraine headaches. Cluster headaches. Chronic daily headaches. Sometimes, the specific cause of a headache may not be found. Follow these instructions at home: Watch your condition for any changes. Let your health care provider know about them. Take these steps to help with your condition: Managing pain     Take over-the-counter and prescription medicines only as told by your health care provider. Treatment may include medicines for pain that are taken by mouth or applied to the skin. Lie down in a dark, quiet room when you have a headache. Keep lights dim if bright lights bother you or make your headaches worse. If directed, put ice on your head and neck area: Put ice in a plastic bag. Place a towel between your skin and the bag. Leave the ice on for 20 minutes, 2-3 times per day. Remove the ice if your skin turns bright red. This is very important. If you cannot feel pain, heat, or cold, you have a greater risk of damage to the area. If directed, apply heat to the affected area. Use the heat source that your health care provider recommends, such as a moist heat pack or a heating pad. Place a towel between your skin and the heat source. Leave the heat on for 20-30 minutes. Remove the heat if your skin turns bright red. This is especially important if you are unable to feel pain, heat, or cold. You have a greater risk of getting burned. Eating and drinking Eat meals on a regular schedule. If you drink alcohol: Limit how much you have to: 0-1 drink a day for women who are not pregnant. 0-2 drinks a day for men. Know how much alcohol is in a drink. In the [place] one drink equals one 12 oz bottle of beer (355 mL), one 5 oz glass of wine (148 mL), or one 1 oz glass of hard liquor (44 mL). Stop  drinking caffeine, or decrease the amount of caffeine you drink. Drink enough fluid to keep your urine pale yellow. General instructions  Keep a headache journal to help find out what may trigger your headaches. For example, write down: What you eat and drink. How much sleep you get. Any change to your diet or medicines. Try massage or other relaxation techniques. Limit stress. Sit up straight, and do not tense your muscles. Do not use any products that contain nicotine or tobacco. These products include cigarettes, chewing tobacco, and vaping devices, such as e-cigarettes. If you need help quitting, ask your health care provider. Exercise regularly as told by your health care provider. Sleep on a regular schedule. Get 7-9 hours of sleep each night, or the amount recommended by your health care provider. Keep all follow-up visits. This is important. Contact a health care provider if: Medicine does not help your symptoms. You have a headache that is different from your usual headache. You have nausea or you vomit. You have a fever. Get help right away if: Your headache: Becomes severe quickly. Gets worse after moderate to intense physical activity. You have any of these symptoms: Repeated vomiting. Pain or stiffness in your neck. Changes to your vision. Pain in an eye or ear. Problems with speech. Muscular weakness or loss of muscle control. Loss of balance or coordination. You feel faint or pass out. You have confusion. You have  a seizure. These symptoms may represent a serious problem that is an emergency. Do not wait to see if the symptoms will go away. Get medical help right away. Call your local emergency services (911 in the [place] Do not drive yourself to the hospital. Summary A headache is pain or discomfort felt around the head or neck area. There are many causes and types of headaches. In some cases, the cause may not be found. Keep a headache journal to help find out  what may trigger your headaches. Watch your condition for any changes. Let your health care provider know about them. Contact a health care provider if you have a headache that is different from the usual headache, or if your symptoms are not helped by medicine. Get help right away if your headache becomes severe, you vomit, you have a loss of vision, you lose your balance, or you have a seizure. This information is not intended to replace advice given to you by your health care provider. Make sure you discuss any questions you have with your health care provider. Document Revised: 08/05/2020 Document Reviewed: 08/05/2020 Elsevier Patient Education  2024 [employer].

## 2023-01-31 ENCOUNTER — Ambulatory Visit: Payer: Medicare HMO | Attending: Family Medicine

## 2023-01-31 DIAGNOSIS — G4452 New daily persistent headache (NDPH): Secondary | ICD-10-CM | POA: Diagnosis not present

## 2023-02-01 ENCOUNTER — Encounter: Payer: Self-pay | Admitting: Family Medicine

## 2023-02-01 LAB — CBC WITH DIFFERENTIAL/PLATELET
Basophils Absolute: 0 10*3/uL (ref 0.0–0.2)
Basos: 1 %
EOS (ABSOLUTE): 0.2 10*3/uL (ref 0.0–0.4)
Eos: 4 %
Hematocrit: 43.4 % (ref 37.5–51.0)
Hemoglobin: 13.6 g/dL (ref 13.0–17.7)
Immature Grans (Abs): 0 10*3/uL (ref 0.0–0.1)
Immature Granulocytes: 0 %
Lymphocytes Absolute: 2 10*3/uL (ref 0.7–3.1)
Lymphs: 42 %
MCH: 27.1 pg (ref 26.6–33.0)
MCHC: 31.3 g/dL — ABNORMAL LOW (ref 31.5–35.7)
MCV: 87 fL (ref 79–97)
Monocytes Absolute: 0.6 10*3/uL (ref 0.1–0.9)
Monocytes: 12 %
Neutrophils Absolute: 2 10*3/uL (ref 1.4–7.0)
Neutrophils: 41 %
Platelets: 196 10*3/uL (ref 150–450)
RBC: 5.02 x10E6/uL (ref 4.14–5.80)
RDW: 17.5 % — ABNORMAL HIGH (ref 11.6–15.4)
WBC: 4.8 10*3/uL (ref 3.4–10.8)

## 2023-02-01 LAB — SEDIMENTATION RATE: Sed Rate: 12 mm/h (ref 0–30)

## 2023-02-09 ENCOUNTER — Other Ambulatory Visit: Payer: Medicare HMO

## 2023-02-09 NOTE — Progress Notes (Signed)
[facility]   MULTIDISCIPLINARY HEART VALVE CLINIC                                     Cardiology Office Note:    Date:  02/10/2023   ID:  Caleb [name], DOB 08-10-1950, MRN [id number]  PCP:  Caleb Register, MD  [facility] Cardiologist:  Caleb Batty, MD  / Dr. Excell Seltzer, MD & Dr. Laneta Simmers MD (TAVR)  [facility] Electrophysiologist:  None   Referring MD: Caleb Register, MD   1 year s/p TAVR  History of Present Illness:    Caleb [name] is a 72 y.o. male with a hx of nonobstructive CAD, chronic systolic heart failure, NICM, carotid artery disease s/p bilateral TCAR, PAD s/p R BKA, hypertension, hyperlipidemia, MGUS, CKD stage IIIb, type 2 diabetes, and severe AS s/p TAVR in 12/2021 who presents for follow-up.  He was hospitalized in 10/2021 in the setting of acute systolic heart failure.  Echocardiogram in 10/2021 revealed significant reduction in EF from 55% to 20% with LFLG AS.  Cardiac catheterization in 10/2021 revealed nonobstructive CAD.  He was started on GDMT and ultimately underwent TAVR with a 26 mm Edwards SAPIEN 3 on 01/11/2022.  PYP scan during TAVR evaluation was inconclusive for cardiac amyloid. Later diagnosed with MGUS. He has a history of PAD and has undergone multiple interventions per Dr. Allyson Sabal and vascular. S/p right below the knee amputation by Dr. Lajoyce Corners on 08/19/22.  Today the patient presents to clinic for follow up. No CP or SOB. No LE edema, orthopnea or PND. No dizziness or syncope. No blood in stool or urine. No palpitations. Walking around well with prosthesis. Moving to [place] to be with family. Wants to keep medical doctors here. [facility]   Past Medical History:  Diagnosis Date   Carotid artery occlusion    CHF (congestive heart failure) (HCC)    Diabetes mellitus without complication (HCC)    Type II   Dyslipidemia 09/27/2012   Erectile dysfunction 09/27/2012   GERD (gastroesophageal reflux disease)     Hypercholesteremia    Hyperlipidemia 08/12/2012   Hypertension    Hyponatremia 08/14/2012   MGUS (monoclonal gammopathy of unknown significance)    Neuropathy    Pancreatitis    Pancreatitis, acute 09/27/2012   Peripheral vascular disease (HCC)    S/P 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 Excell Seltzer & Bartle   Severe aortic stenosis    Vitamin D deficiency 06/23/2015     Current Medications: Current Meds  Medication Sig   acetaminophen (TYLENOL) 325 MG tablet Take 1-2 tablets (325-650 mg total) by mouth every 4 (four) hours as needed for mild pain.   apixaban (ELIQUIS) 5 MG TABS tablet Take 1 tablet (5 mg total) by mouth 2 (two) times daily.   ascorbic acid (VITAMIN C) 1000 MG tablet Take 1 tablet (1,000 mg total) by mouth daily.   atorvastatin (LIPITOR) 40 MG tablet Take 1 tablet (40 mg total) by mouth in the morning.   bisacodyl (DULCOLAX) 5 MG EC tablet Take 1 tablet (5 mg total) by mouth daily as needed for moderate constipation.   carvedilol (COREG) 3.125 MG tablet Take 1 tablet (3.125 mg total) by mouth 2 (two) times daily with a meal.   Cholecalciferol (VITAMIN D-3) 25 MCG (  1000 UT) CAPS Take 1,000 Units by mouth daily.   Continuous Blood Gluc Sensor (FREESTYLE LIBRE 14 DAY SENSOR) MISC Inject 1 Device into the skin every 14 (fourteen) days.   [employer] ASPIRIN LOW DOSE 81 MG tablet TAKE 1 TABLET (81 MG TOTAL) BY MOUTH IN THE MORNING   docusate sodium (COLACE) 100 MG capsule Take 1 capsule (100 mg total) by mouth daily.   ferrous sulfate 325 (65 FE) MG EC tablet Take by mouth.   furosemide (LASIX) 40 MG tablet Take 1 tablet (40 mg total) by mouth daily. In the evening and an 80 mg tablet daily in the morning. (Patient taking differently: Take 40 mg by mouth as needed for edema.)   gabapentin (NEURONTIN) 300 MG capsule Take 1 capsule (300 mg total) by mouth 2 (two) times daily AND 2 capsules (600 mg total)  at bedtime.   hydrALAZINE (APRESOLINE) 10 MG tablet Take by mouth.   HYDROcodone-acetaminophen (NORCO/VICODIN) 5-325 MG tablet Take 1 tablet by mouth every 6 (six) hours as needed for moderate pain.   insulin aspart protamine - aspart (NOVOLOG 70/30 MIX) (70-30) 100 UNIT/ML FlexPen Inject 5 Units into the skin. With meals   isosorbide mononitrate (IMDUR) 30 MG 24 hr tablet Take 0.5 tablets (15 mg total) by mouth daily.   melatonin 3 MG TABS tablet Take 1 tablet (3 mg total) by mouth at bedtime as needed (insomnia).   polyethylene glycol (MIRALAX / GLYCOLAX) 17 g packet Take 17 g by mouth daily.   PRESCRIPTION MEDICATION Inject 0.1-1 mLs as directed See admin instructions. Tri-Mix Standard Strength 5 ml. Formula: Prostaglandin 10 mcg/ml, Papaverine 30 mg/ml, Phentolamine 1 mg/ml. Inject 0.1 ml into side of penis as directed as needed (to be injected immediately before sexual intercourse) may increase the dose by 0.1 ml every 48 hours to achieve an erection. Max dose 1 ml.   triamcinolone cream (KENALOG) 0.1 % Apply topically 2 (two) times daily.   vitamin B-12 (CYANOCOBALAMIN) 500 MCG tablet Take 500 mcg by mouth in the morning.   zinc sulfate 220 (50 Zn) MG capsule Take 1 capsule (220 mg total) by mouth daily.      ROS:   Please see the history of present illness.    All other systems reviewed and are negative.  EKGs   EKG:  EKG is not  Risk Assessment/Calculations:           Physical Exam:    VS:  BP 124/78   Pulse 67   Ht 5\' 7"  (1.702 m)   Wt 180 lb (81.6 kg)   SpO2 98%   BMI 28.19 kg/m     Wt Readings from Last 3 Encounters:  02/10/23 180 lb (81.6 kg)  01/30/23 177 lb 12.8 oz (80.6 kg)  12/30/22 178 lb (80.7 kg)     GEN: Well nourished, well developed in no acute distress NECK: No JVD; No carotid bruits CARDIAC: RRR, no murmurs, rubs, gallops RESPIRATORY:  Clear to auscultation without rales, wheezing or rhonchi  ABDOMEN: Soft, non-tender,  non-distended EXTREMITIES:  No edema; No deformity   ASSESSMENT:    1. S/P TAVR (transcatheter aortic valve replacement)   2. Chronic systolic CHF (congestive heart failure) (HCC)   3. Essential hypertension   4. Stage 3 chronic kidney disease, unspecified whether stage 3a or 3b CKD (HCC)   5. PAD (peripheral artery disease) (HCC)     PLAN:    In order of problems listed above:  Severe LFLG AS s/p TAVR: echo  today shows EF 30%, normally functioning TAVR with a mean gradient of 7 mm hg and no PVL. He has NYHA class I symptoms with a marked clinical improvement since TAVR. He has amoxicillin for SBE prophylaxis. He is on Eliquis 5mg  BID for PAD.  Continue regular follow up with Dr. Allyson Sabal  Chronic combined S/D CHF/NICM: appears to be euvolemic. Continue on Coreg 3.125mg  BID, hydralazine/nitrates and Lasix 40 PRN. Entresto and spiro both discontinued due to hyperkalemia.   HTN: BP well controlled today. No changes made.    CKD stage IIIb: Baseline appears to be 1.4-1.6 range.  Most recent labs showed improvement in creat to 1.25   PAD: Followed by Dr. Allyson Sabal and [facility]. S/p R BKA. Continue Eliquis 5mg  BID and atorvastatin 40mg  daily   Medication Adjustments/Labs and Tests Ordered: Current medicines are reviewed at length with the patient today.  Concerns regarding medicines are outlined above.  No orders of the defined types were placed in this encounter.  No orders of the defined types were placed in this encounter.   Patient Instructions  Medication Instructions:  Your physician recommends that you continue on your current medications as directed. Please refer to the Current Medication list given to you today.  *If you need a refill on your cardiac medications before your next appointment, please call your pharmacy*  Follow-Up: At [facility], you and your health needs are our priority.  As part of our continuing mission to provide you with exceptional heart care, we have  created designated Provider Care Teams.  These Care Teams include your primary Cardiologist (physician) and Advanced Practice Providers (APPs -  Physician Assistants and Nurse Practitioners) who all work together to provide you with the care you need, when you need it.  Your next appointment:   As scheduled with Dr. Allyson Sabal   Signed, Cline Crock, PA-C  02/10/2023 1:51 PM    [facility]

## 2023-02-10 ENCOUNTER — Ambulatory Visit (HOSPITAL_COMMUNITY): Payer: Medicare HMO | Attending: Cardiology

## 2023-02-10 ENCOUNTER — Ambulatory Visit: Payer: No Typology Code available for payment source | Attending: Cardiology | Admitting: Physician Assistant

## 2023-02-10 ENCOUNTER — Telehealth: Payer: Self-pay | Admitting: Cardiovascular Disease

## 2023-02-10 VITALS — BP 124/78 | HR 67 | Ht 67.0 in | Wt 180.0 lb

## 2023-02-10 DIAGNOSIS — Z952 Presence of prosthetic heart valve: Secondary | ICD-10-CM

## 2023-02-10 DIAGNOSIS — N183 Chronic kidney disease, stage 3 unspecified: Secondary | ICD-10-CM | POA: Diagnosis not present

## 2023-02-10 DIAGNOSIS — I739 Peripheral vascular disease, unspecified: Secondary | ICD-10-CM | POA: Diagnosis not present

## 2023-02-10 DIAGNOSIS — I5022 Chronic systolic (congestive) heart failure: Secondary | ICD-10-CM

## 2023-02-10 DIAGNOSIS — N1832 Chronic kidney disease, stage 3b: Secondary | ICD-10-CM | POA: Insufficient documentation

## 2023-02-10 DIAGNOSIS — E875 Hyperkalemia: Secondary | ICD-10-CM | POA: Diagnosis not present

## 2023-02-10 DIAGNOSIS — I10 Essential (primary) hypertension: Secondary | ICD-10-CM

## 2023-02-10 LAB — ECHOCARDIOGRAM COMPLETE
AR max vel: 1.22 cm2
AV Area VTI: 1.18 cm2
AV Area mean vel: 1.24 cm2
AV Mean grad: 7 mm[Hg]
AV Peak grad: 14.4 mm[Hg]
Ao pk vel: 1.9 m/s
Area-P 1/2: 4.29 cm2
S' Lateral: 4.3 cm

## 2023-02-10 NOTE — Telephone Encounter (Signed)
Called Caleb [name] back regarding a sooner appointment. Able to get Caleb [name] in to see Dr. Allyson Sabal in January. Caleb [name] wants to be seen before he permanently moves to [place].

## 2023-02-10 NOTE — Telephone Encounter (Signed)
Pt has asked if his feb 5th appt can be moved up, he is moving to [place] on Feb 9th. Please call pt and let him know if that it possible!

## 2023-02-10 NOTE — Patient Instructions (Signed)
Medication Instructions:  Your physician recommends that you continue on your current medications as directed. Please refer to the Current Medication list given to you today.  *If you need a refill on your cardiac medications before your next appointment, please call your pharmacy*  Follow-Up: At [facility], you and your health needs are our priority.  As part of our continuing mission to provide you with exceptional heart care, we have created designated Provider Care Teams.  These Care Teams include your primary Cardiologist (physician) and Advanced Practice Providers (APPs -  Physician Assistants and Nurse Practitioners) who all work together to provide you with the care you need, when you need it.  Your next appointment:   As scheduled with Dr. Allyson Sabal

## 2023-02-14 ENCOUNTER — Ambulatory Visit: Payer: Self-pay

## 2023-02-14 NOTE — Patient Outreach (Signed)
  Care Coordination   02/14/2023 Name: Caleb [name] MRN: [id number] DOB: 07-Feb-1951   Care Coordination Outreach Attempts:  An unsuccessful telephone outreach was attempted for a scheduled appointment today.  Follow Up Plan:  Additional outreach attempts will be made to offer the patient care coordination information and services.   Encounter Outcome:  Patient Request to Call Back   Care Coordination Interventions:  No, not indicated    Delsa Sale RN BSN CCM [place]  [facility], [facility] Nurse Care Coordinator  Direct Dial: [phone] Website: [email or web address]

## 2023-02-15 ENCOUNTER — Other Ambulatory Visit (HOSPITAL_BASED_OUTPATIENT_CLINIC_OR_DEPARTMENT_OTHER): Payer: Medicare HMO | Admitting: Pharmacist

## 2023-02-15 DIAGNOSIS — E7800 Pure hypercholesterolemia, unspecified: Secondary | ICD-10-CM

## 2023-02-15 NOTE — Progress Notes (Signed)
Pharmacy Quality Measure Review  This patient is appearing on a report for being at risk of failing the adherence measure for cholesterol (statin) medications this calendar year.   Medication: atorvastatin Last fill date: 01/24/2023 for 90 day supply  Insurance report was not up to date. No action needed at this time.   Butch Penny, PharmD, Patsy [place], CPP Clinical Pharmacist [facility] [phone]

## 2023-02-21 ENCOUNTER — Ambulatory Visit: Payer: Self-pay

## 2023-02-22 NOTE — Patient Instructions (Addendum)
Visit Information  Thank you for taking time to visit with me today. Please don't hesitate to contact me if I can be of assistance to you.   Following are the goals we discussed today:   Goals Addressed               This Visit's Progress     Patient Stated     To be active again without shortness of breath (pt-stated)   On track     Care Coordination Interventions: Evaluation of current treatment plan related to s/p TAVR and patient's adherence to plan as established by provider Review of patient status, including review of consultant's reports, relevant laboratory and other test results, and medications completed Evaluation of current treatment plan related to hypertension self management and patient's adherence to plan as established by provider Advised patient, providing education and rationale, to monitor blood pressure daily and record, calling PCP for findings outside established parameters Provided education on prescribed diet low Sodium  Reviewed and discussed patient's next scheduled follow up with Dr. Allyson Sabal on 03/28/23 @11 :15 AM Patient will keep all scheduled appointments with his healthcare team Patient will adhere to a low Sodium diet as directed Patient will continue to monitor his blood pressure at home and notify his doctor of findings outside of established parameters Patient will continue to work with nurse care coordinator for chronic disease management and care coordination needs Last practice recorded BP readings:  BP Readings from Last 3 Encounters:  02/10/23 124/78  01/30/23 127/82  12/30/22 132/80   Most recent eGFR/CrCl:  Lab Results  Component Value Date   EGFR 61 09/26/2022    No components found for: "CRCL"       To lower A1c <7.0% (pt-stated)   On track     Care Coordination Interventions: Evaluation of current treatment plan related to type 2 diabetes mellitus and patient's adherence to plan as established by provider Review of patient status,  including review of consultant's reports, relevant laboratory and other test results, and medications completed Advised patient, providing education and rationale, to check cbg daily before meals and at bedtime and record, calling PCP for findings outside established parameters Educated patient about the importance of closely monitoring cbg's following penile implant and to notify his doctor for abnormal findings  Patient will continue to self monitor his blood sugars at home as directed and notify his doctor of findings outside of his established parameters Patient will continue to take his medications exactly as prescribed  Patient will continue to use portion control and limit carbohydrates Patient will adhere to his prescribed exercise regimen  Patient will continue to work with nurse care coordinator for chronic disease management and care coordination needs Lab Results  Component Value Date   HGBA1C 6.8 (A) 12/30/2022        Other     To undergo penile implant without complications        Care Coordination Interventions: Evaluation of current treatment plan related to penile implant and patient's adherence to plan as established by provider Reviewed and discussed with patient he is scheduled for a penile implant on 03/28/23 Determined patient verbalizes understanding about the procedure and what to expect with his recovery Assessed for SDOH barriers, none identified at this time           Our next appointment is by telephone on 04/04/23 at 12:30 PM  Please call the care guide team at [phone] if you need to cancel or reschedule your appointment.  If you are experiencing a Mental Health or Behavioral Health Crisis or need someone to talk to, please call 1-800-273-TALK (toll free, 24 hour hotline)  Patient verbalizes understanding of instructions and care plan provided today and agrees to view in MyChart. Active MyChart status and patient understanding of how to access  instructions and care plan via MyChart confirmed with patient.     Delsa Sale RN BSN CCM [place]  [facility], [facility] Nurse Care Coordinator  Direct Dial: [phone] Website: [email or web address]

## 2023-02-22 NOTE — Patient Outreach (Signed)
Care Coordination   Follow Up Visit Note   02/21/2023 Name: Caleb [name] MRN: [id number] DOB: Dec 05, 1950  Caleb [name] is a 72 y.o. year old male who sees Hoy Register, MD for primary care. I spoke with  Caleb [name] by phone today.  What matters to the patients health and wellness today?  Patient would like to undergo a penile implant without complications.     Goals Addressed               This Visit's Progress     Patient Stated     To be active again without shortness of breath (pt-stated)   On track     Care Coordination Interventions: Evaluation of current treatment plan related to s/p TAVR and patient's adherence to plan as established by provider Review of patient status, including review of consultant's reports, relevant laboratory and other test results, and medications completed Evaluation of current treatment plan related to hypertension self management and patient's adherence to plan as established by provider Advised patient, providing education and rationale, to monitor blood pressure daily and record, calling PCP for findings outside established parameters Provided education on prescribed diet low Sodium  Reviewed and discussed patient's next scheduled follow up with Dr. Allyson Sabal on 03/28/23 @11 :15 AM Patient will keep all scheduled appointments with his healthcare team Patient will adhere to a low Sodium diet as directed Patient will continue to monitor his blood pressure at home and notify his doctor of findings outside of established parameters Patient will continue to work with nurse care coordinator for chronic disease management and care coordination needs Last practice recorded BP readings:  BP Readings from Last 3 Encounters:  02/10/23 124/78  01/30/23 127/82  12/30/22 132/80   Most recent eGFR/CrCl:  Lab Results  Component Value Date   EGFR 61 09/26/2022    No components found for: "CRCL"       To lower A1c <7.0% (pt-stated)   On track      Care Coordination Interventions: Evaluation of current treatment plan related to type 2 diabetes mellitus and patient's adherence to plan as established by provider Review of patient status, including review of consultant's reports, relevant laboratory and other test results, and medications completed Advised patient, providing education and rationale, to check cbg daily before meals and at bedtime and record, calling PCP for findings outside established parameters Educated patient about the importance of closely monitoring cbg's following penile implant and to notify his doctor for abnormal findings  Patient will continue to self monitor his blood sugars at home as directed and notify his doctor of findings outside of his established parameters Patient will continue to take his medications exactly as prescribed  Patient will continue to use portion control and limit carbohydrates Patient will adhere to his prescribed exercise regimen  Patient will continue to work with nurse care coordinator for chronic disease management and care coordination needs Lab Results  Component Value Date   HGBA1C 6.8 (A) 12/30/2022        Other     To undergo penile implant without complications        Care Coordination Interventions: Evaluation of current treatment plan related to penile implant and patient's adherence to plan as established by provider Reviewed and discussed with patient he is scheduled for a penile implant on 03/28/23 Determined patient verbalizes understanding about the procedure and what to expect with his recovery Assessed for SDOH barriers, none identified at this time  Interventions Today    Flowsheet Row Most Recent Value  Chronic Disease   Chronic disease during today's visit Other, Congestive Heart Failure (CHF), Chronic Kidney Disease/End Stage Renal Disease (ESRD), Hypertension (HTN)  [s/p TAVR]  General Interventions   General Interventions Discussed/Reviewed General  Interventions Discussed, General Interventions Reviewed, Doctor Visits, Labs  Doctor Visits Discussed/Reviewed Doctor Visits Discussed, Doctor Visits Reviewed, Specialist, PCP  Education Interventions   Education Provided Provided Education  Provided Verbal Education On When to see the doctor, Labs, Medication, Exercise, Nutrition  Labs Reviewed Kidney Function, Hgb A1c  Nutrition Interventions   Nutrition Discussed/Reviewed Nutrition Discussed, Nutrition Reviewed, Fluid intake  Pharmacy Interventions   Pharmacy Dicussed/Reviewed Pharmacy Topics Discussed, Pharmacy Topics Reviewed, Medications and their functions  Safety Interventions   Safety Discussed/Reviewed Fall Risk          SDOH assessments and interventions completed:  Yes  SDOH Interventions Today    Flowsheet Row Most Recent Value  SDOH Interventions   Food Insecurity Interventions Intervention Not Indicated  Housing Interventions Intervention Not Indicated  Transportation Interventions Intervention Not Indicated  Utilities Interventions Intervention Not Indicated        Care Coordination Interventions:  Yes, provided   Follow up plan: Follow up call scheduled for 04/04/23 @12 :30 PM    Encounter Outcome:  Patient Visit Completed

## 2023-03-03 ENCOUNTER — Ambulatory Visit
Admission: RE | Admit: 2023-03-03 | Discharge: 2023-03-03 | Disposition: A | Discharge status: Home / Self Care | Payer: Medicare HMO | Source: Ambulatory Visit | Attending: Family Medicine | Admitting: Family Medicine

## 2023-03-03 DIAGNOSIS — R519 Headache, unspecified: Secondary | ICD-10-CM | POA: Diagnosis not present

## 2023-03-09 ENCOUNTER — Encounter: Payer: Self-pay | Admitting: Family Medicine

## 2023-03-28 ENCOUNTER — Ambulatory Visit: Payer: Medicare HMO | Attending: Cardiovascular Disease | Admitting: Cardiovascular Disease

## 2023-04-04 ENCOUNTER — Ambulatory Visit: Payer: Medicare HMO | Admitting: Internal Medicine

## 2023-04-04 ENCOUNTER — Ambulatory Visit: Payer: Self-pay

## 2023-04-04 NOTE — Patient Outreach (Signed)
  Care Coordination   Follow Up Visit Note   04/04/2023 Name: Caleb [name] MRN: [id number] DOB: [birth year]  Caleb [name] is a 73 y.o. year old male who sees Delbert Clam, MD for primary care. I spoke with  Caleb [name] by phone today.  What matters to the patients health and wellness today?  Patient would like to continue to manage his chronic health conditions in order to relocate to [place], to be near his daughters.     Goals Addressed             This Visit's Progress    COMPLETED: To undergo penile implant without complications       Care Coordination Interventions: Evaluation of current treatment plan related to penile implant and patient's adherence to plan as established by provider Determined patient cancelled his penile implant procedure after having a final discussion with his doctor about risks verses benefits Discussed patient will not undergo this procedure, he will continue to use alternative measures as prescribed for erectile dysfunction Discussed patient will move to [place] in early February to live closer to his daughters, he will continue to drive to [place] in order to keep his PCP and other specialists overseeing his care, he [facility] [place] [facility]    Interventions Today    Flowsheet Row Most Recent Value  Chronic Disease   Chronic disease during today's visit Congestive Heart Failure (CHF), Other  [malleable penile prosthesis implant]  General Interventions   General Interventions Discussed/Reviewed General Interventions Discussed, General Interventions Reviewed, Doctor Visits  Education Interventions   Education Provided Provided Education  Provided Verbal Education On Medication, When to see the doctor  Pharmacy Interventions   Pharmacy Dicussed/Reviewed Pharmacy Topics Discussed, Pharmacy Topics Reviewed          SDOH assessments and interventions completed:  No     Care Coordination Interventions:  Yes, provided   Follow up plan:  Follow up call scheduled for 06/02/23 @10 :30 AM    Encounter Outcome:  Patient Visit Completed

## 2023-04-04 NOTE — Patient Instructions (Signed)
 Visit Information  Thank you for taking time to visit with me today. Please don't hesitate to contact me if I can be of assistance to you.   Following are the goals we discussed today:   Goals Addressed             This Visit's Progress    COMPLETED: To undergo penile implant without complications       Care Coordination Interventions: Evaluation of current treatment plan related to penile implant and patient's adherence to plan as established by provider Determined patient cancelled his penile implant procedure after having a final discussion with his doctor about risks verses benefits Discussed patient will not undergo this procedure, he will continue to use alternative measures as prescribed for erectile dysfunction Discussed patient will move to [place] in early February to live closer to his daughters, he will continue to drive to [place] in order to keep his PCP and other specialists overseeing his care, he will [facility] [place] [facility]        Our next appointment is by telephone on 06/02/23 at 10:30 AM  Please call the care guide team at [phone] if you need to cancel or reschedule your appointment.   If you are experiencing a Mental Health or Behavioral Health Crisis or need someone to talk to, please call 1-800-273-TALK (toll free, 24 hour hotline)  Patient verbalizes understanding of instructions and care plan provided today and agrees to view in MyChart. Active MyChart status and patient understanding of how to access instructions and care plan via MyChart confirmed with patient.     Clayborne Ly RN BSN CCM [place]  [facility], [facility] Nurse Care Coordinator  Direct Dial: [phone] Website: [email or web address]

## 2023-04-14 ENCOUNTER — Encounter (HOSPITAL_COMMUNITY): Payer: Medicare HMO

## 2023-04-14 ENCOUNTER — Ambulatory Visit: Payer: Medicare HMO | Admitting: Vascular Surgery

## 2023-04-19 NOTE — Progress Notes (Deleted)
 [facility] [place] [facility] Note   History of Present Illness:  Patient is a 73 y.o. year old male who presents for follow up.   Surgical history is outlined below:  Cerebrovascular: Left TCAR on 12/16/2019 by Dr. Darrick Penna and subsequent right TCAR on 02/03/2020 by Dr. Darrick Penna. This was done to treat asymptomatic, high-grade lesions that required treatment due to high risk aortic stenosis with decreased EF.   PAD: He was seen at [facility] on 06/13/22 with a history of angiography and revascularization in the past by Dr. Allyson Sabal.  He was taken for angiography by Dr. Edilia Bo on 06/13/22 and then to the OR by Dr. Karin Lieu for right fem-AT vein bypass for CLI with tissue loss.  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 Lajoyce Corners.   At his most recent follow-up appointment Finley was doing well.  He presents today to discuss known, asymptomatic, restenosis of his left carotid artery stent.   Draylen's daughters live in [place].  He is planning on moving down and March.  He is excited to spend more time with his family.       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 Excell Seltzer & Bartle   Severe aortic stenosis    Vitamin D deficiency 06/23/2015    Past Surgical History:  Procedure Laterality Date   ABDOMINAL AORTOGRAM W/LOWER EXTREMITY N/A 08/08/2019    Procedure: ABDOMINAL AORTOGRAM W/LOWER EXTREMITY;  Surgeon: Runell Gess, MD;  [facility];  Service: Cardiovascular;  Laterality: N/A;   ABDOMINAL AORTOGRAM W/LOWER EXTREMITY N/A 11/18/2019   Procedure: ABDOMINAL AORTOGRAM W/LOWER EXTREMITY;  Surgeon: Runell Gess, MD;  [facility];  Service: Cardiovascular;  Laterality: N/A;   ABDOMINAL AORTOGRAM W/LOWER EXTREMITY N/A 06/13/2022   Procedure: ABDOMINAL AORTOGRAM W/LOWER EXTREMITY;  Surgeon: Chuck Hint, MD;  Location: [facility] INVASIVE CV LAB;  Service: Cardiovascular;  Laterality: N/A;   AMPUTATION Right 08/19/2022   Procedure: RIGHT BELOW KNEE AMPUTATION;  Surgeon: Nadara Mustard, MD;  Location: [facility] OR;  Service: Orthopedics;  Laterality: Right;   APPLICATION OF WOUND VAC  06/24/2022   Procedure: APPLICATION OF WOUND VAC;  Surgeon: Nadara Mustard, MD;  [facility]: [facility] OR;  Service: Orthopedics;;   CARDIAC CATHETERIZATION     COLONOSCOPY  12 years ago   in [place] clinic= normal exam per pt   ENDARTERECTOMY FEMORAL Right 06/17/2022   Procedure: SUPERFICIAL ENDARTERECTOMY OF COMMON FEMORAL ARTERY;  Surgeon: Victorino Sparrow, MD;  Location: [facility] OR;  Service: Vascular;  Laterality: Right;   FEMORAL-TIBIAL BYPASS GRAFT Right 06/17/2022   Procedure: RIGHT FEMORAL- ANTERIOR TIBIAL ARTERY BYPASS;  Surgeon: Victorino Sparrow, MD;  Location: [facility] OR;  Service: Vascular;  Laterality: Right;   I & D EXTREMITY Right 06/24/2022   Procedure: RIGHT PARTIAL CALCANEUS EXCISION;  Surgeon: Nadara Mustard, MD;  Location: [facility] OR;  Service: Orthopedics;  Laterality: Right;   I & D EXTREMITY Right 07/29/2022   Procedure: RIGHT PARTIAL CALCANEUS EXCISION;  Surgeon: Nadara Mustard, MD;  Location: [facility] OR;  Service: Orthopedics;  Laterality: Right;   INTRAOPERATIVE TRANSTHORACIC ECHOCARDIOGRAM N/A 01/11/2022   Procedure: INTRAOPERATIVE TRANSTHORACIC ECHOCARDIOGRAM;  Surgeon: Tonny Bollman, MD;  Location: [facility] INVASIVE CV LAB;  Service: Open  Heart Surgery;  Laterality: N/A;   KNEE SURGERY     MULTIPLE EXTRACTIONS WITH ALVEOLOPLASTY N/A 12/09/2021   Procedure: MULTIPLE EXTRACTION;  Surgeon: Sharman Cheek, DMD;  [facility];  Service: Dentistry;  Laterality: N/A;   PERIPHERAL VASCULAR INTERVENTION Right 08/08/2019   Procedure: PERIPHERAL VASCULAR INTERVENTION;  Surgeon: Runell Gess, MD;  [facility];  Service: Cardiovascular;  Laterality: Right;   PERIPHERAL VASCULAR INTERVENTION Left 11/18/2019   Procedure: PERIPHERAL VASCULAR INTERVENTION;  Surgeon: Runell Gess, MD;  [facility];  Service: Cardiovascular;  Laterality: Left;  left popliteal artery   RIGHT/LEFT HEART CATH AND CORONARY ANGIOGRAPHY N/A 10/29/2021   Procedure: RIGHT/LEFT HEART CATH AND CORONARY ANGIOGRAPHY;  Surgeon: Kathleene Hazel, MD;  [facility];  Service: Cardiovascular;  Laterality: N/A;   TRANSCAROTID ARTERY REVASCULARIZATION  Left 12/16/2019   Procedure: TRANSCAROTID ARTERY REVASCULARIZATION;  Surgeon: Sherren Kerns, MD;  Location: [facility] OR;  Service: Vascular;  Laterality: Left;   TRANSCAROTID ARTERY REVASCULARIZATION  Right 02/03/2020   Procedure: RIGHT TRANSCAROTID ARTERY REVASCULARIZATION;  Surgeon: Sherren Kerns, MD;  Location: [facility] OR;  Service: Vascular;  Laterality: Right;   TRANSCATHETER AORTIC VALVE REPLACEMENT, TRANSFEMORAL N/A 01/11/2022   Procedure: Transcatheter Aortic Valve Replacement, Transfemoral;  Surgeon: Tonny Bollman, MD;  Location: [facility] INVASIVE CV LAB;  Service: Open Heart Surgery;  Laterality: N/A;   ULTRASOUND GUIDANCE FOR VASCULAR ACCESS Right 12/16/2019   Procedure: ULTRASOUND GUIDANCE FOR VASCULAR ACCESS;  Surgeon: Sherren Kerns, MD;  Location: [facility] OR;  Service: Vascular;  Laterality: Right;   ULTRASOUND GUIDANCE FOR VASCULAR ACCESS Right 02/03/2020   Procedure: ULTRASOUND GUIDANCE FOR VASCULAR ACCESS;  Surgeon: Sherren Kerns, MD;  Location: [facility]  OR;  Service: Vascular;  Laterality: Right;   VASECTOMY     VEIN HARVEST Right 06/17/2022   Procedure: IPSILATERAL RIGHT GREATER SAPHENOUS VEIN HARVEST;  Surgeon: Victorino Sparrow, MD;  [facility] OR;  Service: Vascular;  Laterality: Right;    ROS:   General:  No weight loss, Fever, chills  HEENT: No recent headaches, no nasal bleeding, no visual changes, no sore throat  Neurologic: No dizziness, blackouts, seizures. No recent symptoms of stroke or mini- stroke. No recent episodes of slurred speech, or temporary blindness.  Cardiac: No recent episodes of chest pain/pressure, no shortness of breath at rest.  No shortness of breath with exertion.  Denies history of atrial fibrillation or irregular heartbeat  Vascular: No history of rest pain in feet.  No history of claudication.  No history of non-healing ulcer, No history of DVT   Pulmonary: No home oxygen, no productive cough, no hemoptysis,  No asthma or wheezing  Musculoskeletal:  [ ]  Arthritis, [ ]  Low back pain,  [ ]  Joint pain  Hematologic:No history of hypercoagulable state.  No history of easy bleeding.  No history of anemia  Gastrointestinal: No hematochezia or melena,  No gastroesophageal reflux, no trouble swallowing  Urinary: [ ]  chronic Kidney disease, [ ]  on HD - [ ]  MWF or [ ]   TTHS, [ ]  Burning with urination, [ ]  Frequent urination, [ ]  Difficulty urinating;   Skin: No rashes  Psychological: No history of anxiety,  No history of depression  Social History Social History   Tobacco Use   Smoking status: Never    Passive exposure: Never   Smokeless tobacco: Never  Vaping Use   Vaping status: Never Used  Substance Use Topics   Alcohol use: Yes    Comment: occasional   Drug use: No    Family History Family History  Problem Relation Age of Onset   CAD Father    Hypertension Father    Alcohol abuse Father        Cause of death   Diabetes Mother    Colon polyps Mother    CAD Brother 35       CABG    Colon cancer Neg Hx    Esophageal cancer Neg Hx    Rectal cancer Neg Hx    Stomach cancer Neg Hx     Allergies  Allergies  Allergen Reactions   Codeine Nausea And Vomiting   Percocet [Oxycodone-Acetaminophen] Nausea And Vomiting   Tramadol Hcl Nausea And Vomiting     Current Outpatient Medications  Medication Sig Dispense Refill   acetaminophen (TYLENOL) 325 MG tablet Take 1-2 tablets (325-650 mg total) by mouth every 4 (four) hours as needed for mild pain.     apixaban (ELIQUIS) 5 MG TABS tablet Take 1 tablet (5 mg total) by mouth 2 (two) times daily. 180 tablet 1   ascorbic acid (VITAMIN C) 1000 MG tablet Take 1 tablet (1,000 mg total) by mouth daily.     atorvastatin (LIPITOR) 40 MG tablet Take 1 tablet (40 mg total) by mouth in the morning. 90 tablet 1   bisacodyl (DULCOLAX) 5 MG EC tablet Take 1 tablet (5 mg total) by mouth daily as needed for moderate constipation. 30 tablet 0   carvedilol (COREG) 3.125 MG tablet Take 1 tablet (3.125 mg total) by mouth 2 (two) times daily with a meal. 180 tablet 1   Cholecalciferol (VITAMIN D-3) 25 MCG (1000 UT) CAPS Take 1,000 Units by mouth daily.     Continuous Blood Gluc Sensor (FREESTYLE LIBRE 14 DAY SENSOR) MISC Inject 1 Device into the skin every 14 (fourteen) days.     [employer] ASPIRIN LOW DOSE 81 MG tablet TAKE 1 TABLET (81 MG TOTAL) BY MOUTH IN THE MORNING 90 tablet 1   docusate sodium (COLACE) 100 MG capsule Take 1 capsule (100 mg total) by mouth daily. 30 capsule 0   ferrous sulfate 325 (65 FE) MG EC tablet Take by mouth.     furosemide (LASIX) 40 MG tablet Take 1 tablet (40 mg total) by mouth daily. In the evening and an 80 mg tablet daily in the morning. (Patient taking differently: Take 40 mg by mouth as needed for edema.) 90 tablet 1   gabapentin (NEURONTIN) 300 MG capsule Take 1 capsule (300 mg total) by mouth 2 (two) times daily AND 2 capsules (600 mg total) at bedtime. 120 capsule 1   hydrALAZINE (APRESOLINE) 10 MG tablet Take by  mouth.     HYDROcodone-acetaminophen (NORCO/VICODIN) 5-325 MG tablet Take 1 tablet by mouth every 6 (six) hours as needed for moderate pain. 28 tablet 0   insulin aspart protamine - aspart (NOVOLOG 70/30 MIX) (70-30) 100 UNIT/ML FlexPen Inject 5 Units into the skin. With meals     isosorbide mononitrate (IMDUR) 30 MG 24 hr tablet Take 0.5  tablets (15 mg total) by mouth daily. 45 tablet 1   melatonin 3 MG TABS tablet Take 1 tablet (3 mg total) by mouth at bedtime as needed (insomnia). 30 tablet 0   polyethylene glycol (MIRALAX / GLYCOLAX) 17 g packet Take 17 g by mouth daily. 14 each 0   PRESCRIPTION MEDICATION Inject 0.1-1 mLs as directed See admin instructions. Tri-Mix Standard Strength 5 ml. Formula: Prostaglandin 10 mcg/ml, Papaverine 30 mg/ml, Phentolamine 1 mg/ml. Inject 0.1 ml into side of penis as directed as needed (to be injected immediately before sexual intercourse) may increase the dose by 0.1 ml every 48 hours to achieve an erection. Max dose 1 ml.     spironolactone (ALDACTONE) 25 MG tablet Take 25 mg by mouth daily. Take 1/2 tablet daily per [facility] MD.     triamcinolone cream (KENALOG) 0.1 % Apply topically 2 (two) times daily. 30 g 0   vitamin B-12 (CYANOCOBALAMIN) 500 MCG tablet Take 500 mcg by mouth in the morning.     zinc sulfate 220 (50 Zn) MG capsule Take 1 capsule (220 mg total) by mouth daily.     No current facility-administered medications for this visit.    Physical Examination  There were no vitals filed for this visit.   There is no height or weight on file to calculate BMI.  General:  Alert and oriented, no acute distress HEENT: Normal Neck: No bruit or JVD Pulmonary: Clear to auscultation bilaterally Cardiac: Regular Rate and Rhythm without murmur Abdomen: Soft, non-tender, non-distended, no mass, no scars Skin: No rash Extremity Pulses:  radial, right BKA, left foot edema     Musculoskeletal: left foot and ankle edema 0.5 cm black " blood blister" on the  later left foot.  No open wounds   Neurologic: Upper and lower extremity motor 5/5 and symmetric  DATA:  ABI Findings:  +--------+------------------+-----+--------+--------+  Right  Rt Pressure (mmHg)IndexWaveformComment   +--------+------------------+-----+--------+--------+  [id number]                                     +--------+------------------+-----+--------+--------+  PTA                                   BKA       +--------+------------------+-----+--------+--------+  DP                                    BKA       +--------+------------------+-----+--------+--------+   +---------+------------------+-----+-------------------+-------+  Left    Lt Pressure (mmHg)IndexWaveform           Comment  +---------+------------------+-----+-------------------+-------+  Brachial 135                                                +---------+------------------+-----+-------------------+-------+  PTA     102               0.76 dampened monophasic         +---------+------------------+-----+-------------------+-------+  DP      107               0.79 monophasic                  +---------+------------------+-----+-------------------+-------+  Great Toe82                0.61                             +---------+------------------+-----+-------------------+-------+   +-------+-----------+-----------+-----------------------+------------+  ABI/TBIToday's ABIToday's TBIPrevious ABI           Previous TBI  +-------+-----------+-----------+-----------------------+------------+  Right BKA        BKA        0.70                   absent        +-------+-----------+-----------+-----------------------+------------+  Left  0.79       0.61       0.94 noted as calcified0.44          +-------+-----------+-----------+-----------------------+------------+     Right Carotid Findings:   +----------+--------+--------+--------+------------------+--------+           PSV cm/sEDV cm/sStenosisPlaque DescriptionComments  +----------+--------+--------+--------+------------------+--------+  CCA Prox  82      14                                          +----------+--------+--------+--------+------------------+--------+  CCA Mid   117     20                                          +----------+--------+--------+--------+------------------+--------+  CCA Distal107     19                                          +----------+--------+--------+--------+------------------+--------+  ICA Prox                                            stent     +----------+--------+--------+--------+------------------+--------+  ICA Mid                                             stent     +----------+--------+--------+--------+------------------+--------+  ICA Distal45      16                                          +----------+--------+--------+--------+------------------+--------+  ECA      159                                                 +----------+--------+--------+--------+------------------+--------+   +----------+--------+-------+----------------+-------------------+           PSV cm/sEDV cmsDescribe        Arm Pressure (mmHG)  +----------+--------+-------+----------------+-------------------+  Subclavian102    5      Multiphasic, WNL96                   +----------+--------+-------+----------------+-------------------+   +---------+--------+--+--------+-+---------+  VertebralPSV cm/s35EDV cm/s8Antegrade  +---------+--------+--+--------+-+---------+      Right Stent(s):  +---------------+---+--++++  Prox to Stent  84 14  +---------------+---+--++++  Proximal Stent 10930  +---------------+---+--++++  Mid Stent      11340  +---------------+---+--++++  Distal Stent   11534   +---------------+---+--++++  Distal to [phone]  +---------------+---+--++++   Left Carotid Findings:  +----------+--------+--------+--------+------------------+--------+           PSV cm/sEDV cm/sStenosisPlaque DescriptionComments  +----------+--------+--------+--------+------------------+--------+  CCA Prox  67      13              homogeneous                 +----------+--------+--------+--------+------------------+--------+  CCA Mid   49      9               heterogenous                +----------+--------+--------+--------+------------------+--------+  CCA Distal65      15              heterogenous                +----------+--------+--------+--------+------------------+--------+  ICA Prox                          heterogenous      stent     +----------+--------+--------+--------+------------------+--------+  ICA Mid                                             stent     +----------+--------+--------+--------+------------------+--------+  ICA Distal36      11                                          +----------+--------+--------+--------+------------------+--------+  ECA      277     6       >50%    heterogenous                +----------+--------+--------+--------+------------------+--------+   +----------+--------+--------+----------------+-------------------+           PSV cm/sEDV cm/sDescribe        Arm Pressure (mmHG)  +----------+--------+--------+----------------+-------------------+  [id number]    2       Multiphasic, QIO96                   +----------+--------+--------+----------------+-------------------+   +---------+--------+--+--------+--+---------+  VertebralPSV cm/s51EDV cm/s18Antegrade  +---------+--------+--+--------+--+---------+      Left Stent(s):  +---------------+---+---+-------------+++  Prox to Stent  49 12                 +---------------+---+---+-------------+++  Proximal Stent 317128>75% stenosis  +---------------+---+---+-------------+++  Mid Stent      19458                +---------------+---+---+-------------+++  Distal Stent   11145                +---------------+---+---+-------------+++  Distal to [id number]                +---------------+---+---+-------------+++         ASSESSMENT/PLAN:   PAD now with right BKA by Dr. Lajoyce Corners -pending [employer] prosthesis. He has had previous surgery on the left foot . The ABI shows 0.79 index  of the left LE.  This is unchanged.  Toe pressures sufficient for wound healing.  He does have a 0.5 cm black spot with healthy dermis surrounding it.  It looks like an old blister, with retained, hemolyzed blood.    He has a history of left TCAR on 12/16/2019 by Dr. Darrick Penna and subsequent right TCAR on 02/03/2020 by Dr. Darrick Penna. This was done to treat asymptomatic, high-grade lesions that required treatment due to high risk aortic stenosis with decreased EF.  Carotid duplex ultrasound was reviewed and compared to his last ultrasound which took place in February. There has been no significant change  The left-sided carotid stent continues to have significant stenosis, however velocities demonstrate stenosis less than 80%.  Studies suggest carotid in-stent restenosis lesions can be followed to higher velocities with a lower risk of stroke due to continued laminar flow.  I had a long discussion with Aurelio regarding the above.  My plan would be to fix his carotid should his velocities exceed 350 cm/s which is indicative of greater than 80% stenosis.  He is aware that I cannot ensure he will not have a stroke in the interim, but is comfortable with this follow-up plan as he wants to continue his rehab journey to ambulate again.  I will see him in 6 months to reevaluate the lesion.  I asked that he continue his current medication regimen.  He was asked to call  my office should any questions or concerns arise.  ******325 >80% stenosis   Victorino Sparrow [facility] [place] Office: [phone]

## 2023-04-20 ENCOUNTER — Ambulatory Visit (HOSPITAL_COMMUNITY): Payer: No Typology Code available for payment source | Attending: Vascular Surgery

## 2023-04-20 ENCOUNTER — Encounter (HOSPITAL_COMMUNITY): Payer: Medicare HMO

## 2023-04-20 ENCOUNTER — Ambulatory Visit: Payer: No Typology Code available for payment source | Admitting: Vascular Surgery

## 2023-04-26 ENCOUNTER — Ambulatory Visit: Payer: Medicare HMO | Admitting: Cardiovascular Disease

## 2023-04-30 ENCOUNTER — Other Ambulatory Visit: Payer: Self-pay | Admitting: Family Medicine

## 2023-05-01 ENCOUNTER — Inpatient Hospital Stay: Payer: No Typology Code available for payment source | Attending: Hematology and Oncology

## 2023-05-01 NOTE — Telephone Encounter (Signed)
 Requested Prescriptions  Pending Prescriptions Disp Refills   atorvastatin  (LIPITOR ) 40 MG tablet [Pharmacy Med Name: ATORVASTATIN  40 MG TABLET] 90 tablet 0    Sig: TAKE 1 TABLET (40 MG TOTAL) BY MOUTH IN THE MORNING     Cardiovascular:  Antilipid - Statins Failed - 05/01/2023  3:09 PM      Failed - Lipid Panel in normal range within the last 12 months    Cholesterol, Total  Date Value Ref Range Status  06/05/2020 106 100 - 199 mg/dL Final   Cholesterol  Date Value Ref Range Status  06/14/2022 88 0 - 200 mg/dL Final   LDL Chol Calc (NIH)  Date Value Ref Range Status  06/05/2020 29 0 - 99 mg/dL Final   LDL Cholesterol  Date Value Ref Range Status  06/14/2022 43 0 - 99 mg/dL Final    Comment:           Total Cholesterol/HDL:CHD Risk Coronary Heart Disease Risk Table                     Men   Women  1/2 Average Risk   3.4   3.3  Average Risk       5.0   4.4  2 X Average Risk   9.6   7.1  3 X Average Risk  23.4   11.0        Use the calculated Patient Ratio above and the CHD Risk Table to determine the patient's CHD Risk.        ATP III CLASSIFICATION (LDL):  <100     mg/dL   Optimal  454-098  mg/dL   Near or Above                    Optimal  130-159  mg/dL   Borderline  119-147  mg/dL   High  >829     mg/dL   Very High Performed at [facility], [address] [place], [place] [address]    HDL  Date Value Ref Range Status  06/14/2022 33 (L) >40 mg/dL Final  08/65/7846 53 >96 mg/dL Final   Triglycerides  Date Value Ref Range Status  06/14/2022 60 <150 mg/dL Final         Passed - Patient is not pregnant      Passed - Valid encounter within last 12 months    Recent Outpatient Visits           3 months ago New daily persistent headache   [place] [facility] [place] Joaquin Mulberry, MD   7 months ago Hospital discharge follow-up   [facility] [place]. [facility] [place], [place] W, NP   9 months ago Type 2 diabetes mellitus with other specified complication, with long-term current use of insulin  [facility])   [place] [facility] [place]. [facility] Joaquin Mulberry, MD   1 year ago Hypertension associated with diabetes [facility])   [place] [facility] [place]. [facility] Joaquin Mulberry, MD   1 year ago Hypertension associated with diabetes [facility])    [facility] [place]. [facility] Joaquin Mulberry, MD

## 2023-05-09 ENCOUNTER — Telehealth: Payer: Self-pay

## 2023-05-09 ENCOUNTER — Other Ambulatory Visit: Payer: Self-pay | Admitting: Family Medicine

## 2023-05-09 ENCOUNTER — Ambulatory Visit: Payer: No Typology Code available for payment source | Attending: Family Medicine

## 2023-05-09 VITALS — Ht 67.0 in | Wt 177.5 lb

## 2023-05-09 DIAGNOSIS — Z0000 Encounter for general adult medical examination without abnormal findings: Secondary | ICD-10-CM

## 2023-05-09 DIAGNOSIS — I110 Hypertensive heart disease with heart failure: Secondary | ICD-10-CM

## 2023-05-09 NOTE — Patient Instructions (Addendum)
Caleb [name] , Thank you for taking time to come for your Medicare Wellness Visit. I appreciate your ongoing commitment to your health goals. Please review the following plan we discussed and let me know if I can assist you in the future.   Referrals/Orders/Follow-Ups/Clinician Recommendations: Yes; Keep maintaining your health by keeping your appointments with Dr. Alvis Lemmings and any specialists that you may see.  Call us if you need anything.  Have a great year!!!!  This is a list of the screening recommended for you and due dates:  Health Maintenance  Topic Date Due   Zoster (Shingles) Vaccine (1 of 2) Never done   Eye exam for diabetics  12/15/2022   COVID-19 Vaccine (8 - 2024-25 season) 02/21/2023   Complete foot exam   04/29/2023   Yearly kidney health urinalysis for diabetes  05/28/2023   Hemoglobin A1C  06/30/2023   Yearly kidney function blood test for diabetes  09/26/2023   Medicare Annual Wellness Visit  05/08/2024   Colon Cancer Screening  02/19/2026   DTaP/Tdap/Td vaccine (4 - Td or Tdap) 05/12/2032   Pneumonia Vaccine  Completed   Flu Shot  Completed   Hepatitis C Screening  Completed   HPV Vaccine  Aged Out    Advanced directives: (Copy Requested) Please bring a copy of your health care power of attorney and living will to the office to be added to your chart at your convenience.  Next Medicare Annual Wellness Visit scheduled for next year: Yes

## 2023-05-09 NOTE — Progress Notes (Signed)
Subjective:   Caleb [name] is a 73 y.o. male who presents for Medicare Annual/Subsequent preventive examination.  Visit Complete: Virtual I connected with  Caleb [name] on 05/09/23 by a audio enabled telemedicine application and verified that I am speaking with the correct person using two identifiers.  Patient Location: Home  Provider Location: Home Office  I discussed the limitations of evaluation and management by telemedicine. The patient expressed understanding and agreed to proceed.  Vital Signs: Because this visit was a virtual/telehealth visit, some criteria may be missing or patient reported. Any vitals not documented were not able to be obtained and vitals that have been documented are patient reported.  Interactive audio and video telecommunications were attempted between this provider and patient, however failed, due to patient having technical difficulties OR patient did not have access to video capability.  We continued and completed visit with audio only.  Cardiac Risk Factors include: advanced age (>45men, >39 women);diabetes mellitus;dyslipidemia;family history of premature cardiovascular disease;hypertension;male gender     Objective:    Today's Vitals   05/09/23 1503  Weight: 177 lb 8 oz (80.5 kg)  Height: 5\' 7"  (1.702 m)  PainSc: 0-No pain   Body mass index is 27.8 kg/m.     05/09/2023    3:09 PM 11/28/2022    1:12 PM 08/22/2022    7:37 PM 08/19/2022   12:00 AM 08/18/2022    3:01 PM 08/11/2022    3:28 PM 08/03/2022    1:06 PM  Advanced Directives  Does Patient Have a Medical Advance Directive? Yes Yes Yes  No No Yes  Type of Estate agent of [place];Living will Healthcare Power of [place];Living will Healthcare Power of [place];Living will    Living will  Does patient want to make changes to medical advance directive?   No - Patient declined    No - Patient declined  Copy of Healthcare Power of Attorney in Chart? No - copy  requested Yes - validated most recent copy scanned in chart (See row information)       Would patient like information on creating a medical advance directive?   No - Patient declined No - Patient declined       Current Medications (verified) Outpatient Encounter Medications as of 05/09/2023  Medication Sig   acetaminophen (TYLENOL) 325 MG tablet Take 1-2 tablets (325-650 mg total) by mouth every 4 (four) hours as needed for mild pain.   apixaban (ELIQUIS) 5 MG TABS tablet Take 1 tablet (5 mg total) by mouth 2 (two) times daily.   ascorbic acid (VITAMIN C) 1000 MG tablet Take 1 tablet (1,000 mg total) by mouth daily.   atorvastatin (LIPITOR) 40 MG tablet TAKE 1 TABLET (40 MG TOTAL) BY MOUTH IN THE MORNING   bisacodyl (DULCOLAX) 5 MG EC tablet Take 1 tablet (5 mg total) by mouth daily as needed for moderate constipation.   carvedilol (COREG) 3.125 MG tablet Take 1 tablet (3.125 mg total) by mouth 2 (two) times daily with a meal.   Cholecalciferol (VITAMIN D-3) 25 MCG (1000 UT) CAPS Take 1,000 Units by mouth daily.   Continuous Blood Gluc Sensor (FREESTYLE LIBRE 14 DAY SENSOR) MISC Inject 1 Device into the skin every 14 (fourteen) days.   [employer] ASPIRIN LOW DOSE 81 MG tablet TAKE 1 TABLET (81 MG TOTAL) BY MOUTH IN THE MORNING   docusate sodium (COLACE) 100 MG capsule Take 1 capsule (100 mg total) by mouth daily.   ferrous sulfate 325 (65 FE)  MG EC tablet Take by mouth.   furosemide (LASIX) 40 MG tablet Take 1 tablet (40 mg total) by mouth daily. In the evening and an 80 mg tablet daily in the morning. (Patient taking differently: Take 40 mg by mouth as needed for edema.)   gabapentin (NEURONTIN) 300 MG capsule Take 1 capsule (300 mg total) by mouth 2 (two) times daily AND 2 capsules (600 mg total) at bedtime.   hydrALAZINE (APRESOLINE) 10 MG tablet Take by mouth.   HYDROcodone-acetaminophen (NORCO/VICODIN) 5-325 MG tablet Take 1 tablet by mouth every 6 (six) hours as needed for moderate pain.    insulin aspart protamine - aspart (NOVOLOG 70/30 MIX) (70-30) 100 UNIT/ML FlexPen Inject 5 Units into the skin. With meals   isosorbide mononitrate (IMDUR) 30 MG 24 hr tablet Take 0.5 tablets (15 mg total) by mouth daily.   melatonin 3 MG TABS tablet Take 1 tablet (3 mg total) by mouth at bedtime as needed (insomnia).   polyethylene glycol (MIRALAX / GLYCOLAX) 17 g packet Take 17 g by mouth daily.   PRESCRIPTION MEDICATION Inject 0.1-1 mLs as directed See admin instructions. Tri-Mix Standard Strength 5 ml. Formula: Prostaglandin 10 mcg/ml, Papaverine 30 mg/ml, Phentolamine 1 mg/ml. Inject 0.1 ml into side of penis as directed as needed (to be injected immediately before sexual intercourse) may increase the dose by 0.1 ml every 48 hours to achieve an erection. Max dose 1 ml.   spironolactone (ALDACTONE) 25 MG tablet Take 25 mg by mouth daily. Take 1/2 tablet daily per [facility] MD.   triamcinolone cream (KENALOG) 0.1 % Apply topically 2 (two) times daily.   vitamin B-12 (CYANOCOBALAMIN) 500 MCG tablet Take 500 mcg by mouth in the morning.   zinc sulfate 220 (50 Zn) MG capsule Take 1 capsule (220 mg total) by mouth daily.   No facility-administered encounter medications on file as of 05/09/2023.    Allergies (verified) Codeine, Percocet [oxycodone-acetaminophen], and Tramadol hcl   History: Past Medical History:  Diagnosis Date   Carotid artery occlusion    CHF (congestive heart failure) (HCC)    Diabetes mellitus without complication (HCC)    Type II   Dyslipidemia 09/27/2012   Erectile dysfunction 09/27/2012   GERD (gastroesophageal reflux disease)    Hypercholesteremia    Hyperlipidemia 08/12/2012   Hypertension    Hyponatremia 08/14/2012   MGUS (monoclonal gammopathy of unknown significance)    Neuropathy    Pancreatitis    Pancreatitis, acute 09/27/2012   Peripheral vascular disease (HCC)    S/P TAVR (transcatheter aortic valve replacement) 01/11/2022   s/p TAVR with a 26 mm Edwards  S3UR via the TF approach by Dr. Excell Seltzer & Bartle   Severe aortic stenosis    Vitamin D deficiency 06/23/2015   Past Surgical History:  Procedure Laterality Date   ABDOMINAL AORTOGRAM W/LOWER EXTREMITY N/A 08/08/2019   Procedure: ABDOMINAL AORTOGRAM W/LOWER EXTREMITY;  Surgeon: Runell Gess, MD;  Location: [facility] INVASIVE CV LAB;  Service: Cardiovascular;  Laterality: N/A;   ABDOMINAL AORTOGRAM W/LOWER EXTREMITY N/A 11/18/2019   Procedure: ABDOMINAL AORTOGRAM W/LOWER EXTREMITY;  Surgeon: Runell Gess, MD;  Location: [facility];  Service: Cardiovascular;  Laterality: N/A;   ABDOMINAL AORTOGRAM W/LOWER EXTREMITY N/A 06/13/2022   Procedure: ABDOMINAL AORTOGRAM W/LOWER EXTREMITY;  Surgeon: Chuck Hint, MD;  Location: [facility] INVASIVE CV LAB;  Service: Cardiovascular;  Laterality: N/A;   AMPUTATION Right 08/19/2022   Procedure: RIGHT BELOW KNEE AMPUTATION;  Surgeon: Nadara Mustard, MD;  Location: [facility]  OR;  Service: Orthopedics;  Laterality: Right;   APPLICATION OF WOUND VAC  06/24/2022   Procedure: APPLICATION OF WOUND VAC;  Surgeon: Nadara Mustard, MD;  Location: [facility] OR;  Service: Orthopedics;;   CARDIAC CATHETERIZATION     COLONOSCOPY  12 years ago   in [place] clinic= normal exam per pt   ENDARTERECTOMY FEMORAL Right 06/17/2022   Procedure: SUPERFICIAL ENDARTERECTOMY OF COMMON FEMORAL ARTERY;  Surgeon: Victorino Sparrow, MD;  Location: [facility] OR;  Service: Vascular;  Laterality: Right;   FEMORAL-TIBIAL BYPASS GRAFT Right 06/17/2022   Procedure: RIGHT FEMORAL- ANTERIOR TIBIAL ARTERY BYPASS;  Surgeon: Victorino Sparrow, MD;  Location: [facility] OR;  Service: Vascular;  Laterality: Right;   I & D EXTREMITY Right 06/24/2022   Procedure: RIGHT PARTIAL CALCANEUS EXCISION;  Surgeon: Nadara Mustard, MD;  Location: [facility] OR;  Service: Orthopedics;  Laterality: Right;   I & D EXTREMITY Right 07/29/2022   Procedure: RIGHT PARTIAL CALCANEUS EXCISION;  Surgeon: Nadara Mustard, MD;  Location: [facility] OR;  Service:  Orthopedics;  Laterality: Right;   INTRAOPERATIVE TRANSTHORACIC ECHOCARDIOGRAM N/A 01/11/2022   Procedure: INTRAOPERATIVE TRANSTHORACIC ECHOCARDIOGRAM;  Surgeon: Tonny Bollman, MD;  Location: [facility] INVASIVE CV LAB;  Service: Open Heart Surgery;  Laterality: N/A;   KNEE SURGERY     MULTIPLE EXTRACTIONS WITH ALVEOLOPLASTY N/A 12/09/2021   Procedure: MULTIPLE EXTRACTION;  Surgeon: Sharman Cheek, DMD;  Location[facility] OR;  Service: Dentistry;  Laterality: N/A;   PERIPHERAL VASCULAR INTERVENTION Right 08/08/2019   Procedure: PERIPHERAL VASCULAR INTERVENTION;  Surgeon: Runell Gess, MD;  [facility];  Service: Cardiovascular;  Laterality: Right;   PERIPHERAL VASCULAR INTERVENTION Left 11/18/2019   Procedure: PERIPHERAL VASCULAR INTERVENTION;  Surgeon: Runell Gess, MD;  [facility];  Service: Cardiovascular;  Laterality: Left;  left popliteal artery   RIGHT/LEFT HEART CATH AND CORONARY ANGIOGRAPHY N/A 10/29/2021   Procedure: RIGHT/LEFT HEART CATH AND CORONARY ANGIOGRAPHY;  Surgeon: Kathleene Hazel, MD;  [facility]: [facility];  Service: Cardiovascular;  Laterality: N/A;   TRANSCAROTID ARTERY REVASCULARIZATION  Left 12/16/2019   Procedure: TRANSCAROTID ARTERY REVASCULARIZATION;  Surgeon: Sherren Kerns, MD;  Location: [facility] OR;  Service: Vascular;  Laterality: Left;   TRANSCAROTID ARTERY REVASCULARIZATION  Right 02/03/2020   Procedure: RIGHT TRANSCAROTID ARTERY REVASCULARIZATION;  Surgeon: Sherren Kerns, MD;  Location: [facility] OR;  Service: Vascular;  Laterality: Right;   TRANSCATHETER AORTIC VALVE REPLACEMENT, TRANSFEMORAL N/A 01/11/2022   Procedure: Transcatheter Aortic Valve Replacement, Transfemoral;  Surgeon: Tonny Bollman, MD;  Location: [facility] INVASIVE CV LAB;  Service: Open Heart Surgery;  Laterality: N/A;   ULTRASOUND GUIDANCE FOR VASCULAR ACCESS Right 12/16/2019   Procedure: ULTRASOUND GUIDANCE FOR VASCULAR ACCESS;  Surgeon: Sherren Kerns, MD;  Location: [facility] OR;  Service: Vascular;  Laterality: Right;   ULTRASOUND GUIDANCE FOR VASCULAR ACCESS Right 02/03/2020   Procedure: ULTRASOUND GUIDANCE FOR VASCULAR ACCESS;  Surgeon: Sherren Kerns, MD;  Location: [facility] OR;  Service: Vascular;  Laterality: Right;   VASECTOMY     VEIN HARVEST Right 06/17/2022   Procedure: IPSILATERAL RIGHT GREATER SAPHENOUS VEIN HARVEST;  Surgeon: Victorino Sparrow, MD;  Location: [facility] OR;  Service: Vascular;  Laterality: Right;   Family History  Problem Relation Age of Onset   CAD Father    Hypertension Father    Alcohol abuse Father        Cause of death   Diabetes Mother    Colon polyps Mother  CAD Brother 38       CABG   Colon cancer Neg Hx    Esophageal cancer Neg Hx    Rectal cancer Neg Hx    Stomach cancer Neg Hx    Social History   Socioeconomic History   Marital status: Widowed    Spouse name: Not on file   Number of children: 3   Years of education: Not on file   Highest education level: High school graduate  Occupational History   Occupation: Caregiver    Comment: Special needs    Comment: retired  Tobacco Use   Smoking status: Never    Passive exposure: Never   Smokeless tobacco: Never  Vaping Use   Vaping status: Never Used  Substance and Sexual Activity   Alcohol use: Yes    Comment: occasional   Drug use: No   Sexual activity: Not Currently  Other Topics Concern   Not on file  Social History Narrative   Widower.  3 daughters and 3 grandchildren.     Social Drivers of Corporate investment banker Strain: Low Risk  (05/09/2023)   Overall Financial Resource Strain (CARDIA)    Difficulty of Paying Living Expenses: Not hard at all  Food Insecurity: No Food Insecurity (05/09/2023)   Hunger Vital Sign    Worried About Running Out of Food in the Last Year: Never true    Ran Out of Food in the Last Year: Never true  Transportation Needs: No Transportation Needs (05/09/2023)   PRAPARE - Scientist, research (physical sciences) (Medical): No    Lack of Transportation (Non-Medical): No  Physical Activity: Sufficiently Active (05/09/2023)   Exercise Vital Sign    Days of Exercise per Week: 4 days    Minutes of Exercise per Session: 90 min  Stress: No Stress Concern Present (05/09/2023)   [employer] of Occupational Health - Occupational Stress Questionnaire    Feeling of Stress : Not at all  Social Connections: Moderately Isolated (05/09/2023)   Social Connection and Isolation Panel [NHANES]    Frequency of Communication with Friends and Family: More than three times a week    Frequency of Social Gatherings with Friends and Family: More than three times a week    Attends Religious Services: More than 4 times per year    Active Member of [employer] or Organizations: No    Attends Banker Meetings: Never    Marital Status: Widowed    Tobacco Counseling Counseling given: Not Answered   Clinical Intake:  Pre-visit preparation completed: Yes  Pain : No/denies pain Pain Score: 0-No pain     BMI - recorded: 27.8 Nutritional Status: BMI 25 -29 Overweight Nutritional Risks: None Diabetes: Yes CBG done?: No Did pt. bring in CBG monitor from home?: No  How often do you need to have someone help you when you read instructions, pamphlets, or other written materials from your doctor or pharmacy?: 1 - Never What is the last grade level you completed in school?: HSG  Interpreter Needed?: No  Information entered by :: Camilla Skeen N. Aycen Porreca, LPN.   Activities of Daily Living    05/09/2023    3:15 PM 08/22/2022    7:37 PM  In your present state of health, do you have any difficulty performing the following activities:  Hearing? 0 0  Vision? 0 0  Difficulty concentrating or making decisions? 0 0  Walking or climbing stairs? 1 1  Dressing or bathing? 0 0  Doing errands, shopping? 0 0  Preparing Food and eating ? N   Using the Toilet? N   In the past six months, have you accidently  leaked urine? N   Do you have problems with loss of bowel control? N   Managing your Medications? N   Managing your Finances? N   Housekeeping or managing your Housekeeping? N     Patient Care Team: Hoy Register, MD as PCP - General (Family Medicine) Runell Gess, MD as PCP - Cardiology (Cardiology) Clarene Duke Karma Lew, RN as [facility] Management [facility] as Consulting Physician (Ophthalmology)  Indicate any recent Medical Services you may have received from other than [facility] providers in the past year (date may be approximate).     Assessment:   This is a routine wellness examination for Deen.  Hearing/Vision screen Hearing Screening - Comments:: Denies hearing difficulties. No hearing aids.   Vision Screening - Comments:: Wears rx glasses - up to date with routine eye exams with [facility] and [facility]    Goals Addressed             This Visit's Progress    Maintain my health by staying independent, physically active and socially involved.        Depression Screen    05/09/2023    3:16 PM 01/30/2023    2:28 PM 10/03/2022    2:08 PM 05/02/2022    1:48 PM 04/25/2022   10:10 AM 01/20/2022   10:47 AM 11/18/2021   11:20 AM  PHQ 2/9 Scores  PHQ - 2 Score 0 0 0 0 0 0   PHQ- 9 Score 0 0 0  1 0   Exception Documentation       Patient refusal    Fall Risk    05/09/2023    3:12 PM 01/30/2023    2:27 PM 10/03/2022    2:08 PM 07/25/2022   10:59 AM 05/02/2022    1:48 PM  Fall Risk   Falls in the past year? 0 0 1 0 0  Number falls in past yr: 0 0 0 0 0  Injury with Fall? 0 0 0 0 0  Risk for fall due to : No Fall Risks No Fall Risks  No Fall Risks No Fall Risks  Follow up Falls prevention discussed;Falls evaluation completed Falls evaluation completed   Education provided    MEDICARE RISK AT HOME: Medicare Risk at Home Any stairs in or around the home?: No If so, are there any without handrails?: No Home free of loose throw rugs in  walkways, pet beds, electrical cords, etc?: Yes Adequate lighting in your home to reduce risk of falls?: Yes Life alert?: Yes (Watch (monitors patient and contacts daughter)) Use of a cane, walker or w/c?: Yes Grab bars in the bathroom?: Yes Shower chair or bench in shower?: Yes (patient just ordered) Elevated toilet seat or a handicapped toilet?: Yes  TIMED UP AND GO:  Was the test performed?  No    Cognitive Function:    05/09/2023    3:15 PM 05/02/2022    1:49 PM  MMSE - Mini Mental State Exam  Not completed: Unable to complete   Orientation to time  5  Orientation to Place  5  Registration  3  Attention/ Calculation  5  Recall  3  Language- name 2 objects  2  Language- repeat  1  Language- follow 3 step command  3  Language- read & follow  direction  1  Write a sentence  1  Copy design  1  Total score  30        05/09/2023    3:15 PM 05/02/2022    1:50 PM 12/06/2020    2:21 PM  6CIT Screen  What Year? 0 points 0 points 0 points  What month? 0 points 0 points 0 points  What time? 0 points 0 points 0 points  Count back from 20 0 points 0 points 0 points  Months in reverse 0 points 0 points 0 points  Repeat phrase 0 points 0 points 0 points  Total Score 0 points 0 points 0 points    Immunizations Immunization History  Administered Date(s) Administered   Fluad Quad(high Dose 65+) 01/19/2021   Influenza,inj,Quad PF,6+ Mos 02/26/2020   Moderna Covid-19 Fall Seasonal Vaccine 47yrs & older 04/12/2022, 12/27/2022   Moderna Sars-Covid-2 Vaccination 04/19/2019, 05/17/2019, 01/13/2020, 06/23/2020   Pneumococcal Conjugate-13 09/13/2016, 11/09/2017   Pneumococcal Polysaccharide-23 08/15/2012, 01/14/2019   Tdap 05/26/2014, 06/06/2016, 05/12/2022    TDAP status: Up to date  Flu Vaccine status: Up to date  Pneumococcal vaccine status: Up to date  Covid-19 vaccine status: Completed vaccines  Qualifies for Shingles Vaccine? Yes   Zostavax completed No   Shingrix  Completed?: No.    Education has been provided regarding the importance of this vaccine. Patient has been advised to call insurance company to determine out of pocket expense if they have not yet received this vaccine. Advised may also receive vaccine at local pharmacy or Health Dept. Verbalized acceptance and understanding.  Screening Tests Health Maintenance  Topic Date Due   Zoster Vaccines- Shingrix (1 of 2) Never done   OPHTHALMOLOGY EXAM  12/15/2022   COVID-19 Vaccine (8 - 2024-25 season) 02/21/2023   FOOT EXAM  04/29/2023   Diabetic kidney evaluation - Urine ACR  05/28/2023   HEMOGLOBIN A1C  06/30/2023   Diabetic kidney evaluation - eGFR measurement  09/26/2023   Medicare Annual Wellness (AWV)  05/08/2024   Colonoscopy  02/19/2026   DTaP/Tdap/Td (4 - Td or Tdap) 05/12/2032   Pneumonia Vaccine 26+ Years old  Completed   INFLUENZA VACCINE  Completed   Hepatitis C Screening  Completed   HPV VACCINES  Aged Out    Health Maintenance  Health Maintenance Due  Topic Date Due   Zoster Vaccines- Shingrix (1 of 2) Never done   OPHTHALMOLOGY EXAM  12/15/2022   COVID-19 Vaccine (8 - 2024-25 season) 02/21/2023   FOOT EXAM  04/29/2023   Diabetic kidney evaluation - Urine ACR  05/28/2023    Colorectal cancer screening: Type of screening: Colonoscopy. Completed 02/20/2019. Repeat every 7 years  Lung Cancer Screening: (Low Dose CT Chest recommended if Age 72-80 years, 20 pack-year currently smoking OR have quit w/in 15years.) does not qualify.   Lung Cancer Screening Referral: NO  Additional Screening:  Hepatitis C Screening: does qualify; Completed 05/28/2022  Vision Screening: Recommended annual ophthalmology exams for early detection of glaucoma and other disorders of the eye. Is the patient up to date with their annual eye exam?  Yes  Who is the provider or what is the name of the office in which the patient attends annual eye exams? [facility] If pt is not established with a provider, would they like to be referred to a provider to establish care? No .   Dental Screening: Recommended annual dental exams for proper oral hygiene  Diabetic Foot Exam: Diabetic Foot Exam:  Completed 04/28/2022; patient is overdue.  Community Resource Referral / Chronic Care Management: CRR required this visit?  No   CCM required this visit?  No     Plan:     I have personally reviewed and noted the following in the patient's chart:   Medical and social history Use of alcohol, tobacco or illicit drugs  Current medications and supplements including opioid prescriptions. Patient is currently taking opioid prescriptions. Information provided to patient regarding non-opioid alternatives. Patient advised to discuss non-opioid treatment plan with their provider. Functional ability and status Nutritional status Physical activity Advanced directives List of other physicians Hospitalizations, surgeries, and ER visits in previous 12 months Vitals Screenings to include cognitive, depression, and falls Referrals and appointments  In addition, I have reviewed and discussed with patient certain preventive protocols, quality metrics, and best practice recommendations. A written personalized care plan for preventive services as well as general preventive health recommendations were provided to patient.     Mickeal Needy, LPN   05/04/863   After Visit Summary: (MyChart) Due to this being a telephonic visit, the after visit summary with patients personalized plan was offered to patient via MyChart   Nurse Notes: Patient was advised of screenings that are past due. Patient will schedule an appointment with PCP in April/May 2025.

## 2023-05-09 NOTE — Telephone Encounter (Signed)
Returned his call. He is canceling appt on 2/20. He has moved to [place]. He will call the office back to reschedule. He may come for a visit in April or May. Appt canceled.

## 2023-05-11 ENCOUNTER — Inpatient Hospital Stay: Payer: No Typology Code available for payment source | Admitting: Hematology and Oncology

## 2023-06-02 ENCOUNTER — Ambulatory Visit: Payer: Self-pay

## 2023-06-02 NOTE — Patient Instructions (Signed)
 Visit Information  Thank you for taking time to visit with me today. Please don't hesitate to contact me if I can be of assistance to you.   Following are the goals we discussed today:   Goals Addressed               This Visit's Progress     Patient Stated     COMPLETED: To be active again without shortness of breath (pt-stated)        Care Coordination Interventions: Basic overview and discussion of pathophysiology of Heart Failure reviewed Provided education on low sodium diet Reviewed Heart Failure Action Plan in depth and provided written copy Assessed need for readable accurate scales in home Provided education about placing scale on hard, flat surface Advised patient to weigh each morning after emptying bladder Discussed importance of daily weight and advised patient to weigh and record daily Reviewed role of diuretics in prevention of fluid overload and management of heart failure; Discussed the importance of keeping all appointments with provider         COMPLETED: To lower A1c <7.0% (pt-stated)        Care Coordination Interventions: Evaluation of current treatment plan related to type 2 diabetes mellitus and patient's adherence to plan as established by provider Review of patient status, including review of consultant's reports, relevant laboratory and other test results, and medications completed Advised patient of nurse care coordination case closure due to patient is staying healthy and managing his chronic conditions  Instructed patient to keep his doctor well informed of new symptoms or concerns  Lab Results  Component Value Date   HGBA1C 6.8 (A) 12/30/2022         If you are experiencing a Mental Health or Behavioral Health Crisis or need someone to talk to, please call 1-800-273-TALK (toll free, 24 hour hotline)  Patient verbalizes understanding of instructions and care plan provided today and agrees to view in MyChart. Active MyChart status and patient  understanding of how to access instructions and care plan via MyChart confirmed with patient.     Delsa Sale RN BSN CCM [place]  [facility], [facility] Health Nurse Care Coordinator  Direct Dial: [phone] Website: [email or web address]

## 2023-06-02 NOTE — Patient Outreach (Signed)
 Care Coordination   Follow Up Visit Note   06/02/2023 Name: Caleb [name] MRN: [id number] DOB: 21-Nov-1950  Caleb [name] is a 73 y.o. year old male who sees Hoy Register, MD for primary care. I spoke with  [name] by phone today.  What matters to the patients health and wellness today?  Patient would like to continue to manage his chronic health conditions and stay healthy.     Goals Addressed               This Visit's Progress     Patient Stated     COMPLETED: To be active again without shortness of breath (pt-stated)        Care Coordination Interventions: Basic overview and discussion of pathophysiology of Heart Failure reviewed Provided education on low sodium diet Reviewed Heart Failure Action Plan in depth and provided written copy Assessed need for readable accurate scales in home Provided education about placing scale on hard, flat surface Advised patient to weigh each morning after emptying bladder Discussed importance of daily weight and advised patient to weigh and record daily Reviewed role of diuretics in prevention of fluid overload and management of heart failure; Discussed the importance of keeping all appointments with provider         COMPLETED: To lower A1c <7.0% (pt-stated)        Care Coordination Interventions: Evaluation of current treatment plan related to type 2 diabetes mellitus and patient's adherence to plan as established by provider Review of patient status, including review of consultant's reports, relevant laboratory and other test results, and medications completed Advised patient of nurse care coordination case closure due to patient is staying healthy and managing his chronic conditions  Instructed patient to keep his doctor well informed of new symptoms or concerns  Lab Results  Component Value Date   HGBA1C 6.8 (A) 12/30/2022      Interventions Today    Flowsheet Row Most Recent Value  Chronic Disease   Chronic  disease during today's visit Congestive Heart Failure (CHF)  General Interventions   General Interventions Discussed/Reviewed General Interventions Discussed, General Interventions Reviewed, Labs, Doctor Visits  Doctor Visits Discussed/Reviewed Doctor Visits Discussed, Doctor Visits Reviewed, PCP, Specialist  Exercise Interventions   Exercise Discussed/Reviewed Physical Activity, Exercise Reviewed, Exercise Discussed  Physical Activity Discussed/Reviewed Physical Activity Discussed, Physical Activity Reviewed, Types of exercise  Education Interventions   Education Provided Provided Education  Provided Verbal Education On Medication, When to see the doctor          SDOH assessments and interventions completed:  No     Care Coordination Interventions:  Yes, provided   Follow up plan: No further intervention required.   Encounter Outcome:  Patient Visit Completed

## 2023-07-19 ENCOUNTER — Encounter: Payer: Self-pay | Admitting: Orthopedic Surgery

## 2023-09-06 ENCOUNTER — Telehealth: Payer: Self-pay | Admitting: Podiatry

## 2023-09-06 NOTE — Telephone Encounter (Signed)
 pt lft mess to adv he needs MR for his new dr in [place]. I called and gave him MR# [id number] to req them

## 2023-10-16 ENCOUNTER — Other Ambulatory Visit: Payer: Self-pay | Admitting: Vascular Surgery

## 2023-10-16 DIAGNOSIS — I6523 Occlusion and stenosis of bilateral carotid arteries: Secondary | ICD-10-CM

## 2024-03-25 ENCOUNTER — Telehealth: Payer: Self-pay

## 2024-03-25 NOTE — Telephone Encounter (Signed)
 Called and left a message asking if he wants appt with Dr. Lonn.  He would need labs 10 days prior to MD appt.

## 2024-05-14 ENCOUNTER — Ambulatory Visit: Payer: No Typology Code available for payment source
